# Patient Record
Sex: Female | Born: 1955 | Race: Black or African American | Hispanic: No | Marital: Single | Smoking: Former smoker
Health system: Southern US, Community
[De-identification: ages and names within clinical notes are randomized; demographics above are authoritative.]

## PROBLEM LIST (undated history)

## (undated) DIAGNOSIS — J45909 Unspecified asthma, uncomplicated: Secondary | ICD-10-CM

## (undated) DIAGNOSIS — K219 Gastro-esophageal reflux disease without esophagitis: Secondary | ICD-10-CM

## (undated) DIAGNOSIS — R569 Unspecified convulsions: Secondary | ICD-10-CM

## (undated) DIAGNOSIS — I639 Cerebral infarction, unspecified: Secondary | ICD-10-CM

## (undated) DIAGNOSIS — D62 Acute posthemorrhagic anemia: Secondary | ICD-10-CM

## (undated) DIAGNOSIS — K5900 Constipation, unspecified: Secondary | ICD-10-CM

## (undated) DIAGNOSIS — E119 Type 2 diabetes mellitus without complications: Secondary | ICD-10-CM

## (undated) DIAGNOSIS — K2980 Duodenitis without bleeding: Secondary | ICD-10-CM

## (undated) DIAGNOSIS — G8929 Other chronic pain: Secondary | ICD-10-CM

## (undated) DIAGNOSIS — T148XXA Other injury of unspecified body region, initial encounter: Secondary | ICD-10-CM

## (undated) DIAGNOSIS — I509 Heart failure, unspecified: Secondary | ICD-10-CM

## (undated) DIAGNOSIS — I10 Essential (primary) hypertension: Secondary | ICD-10-CM

## (undated) DIAGNOSIS — J189 Pneumonia, unspecified organism: Secondary | ICD-10-CM

## (undated) DIAGNOSIS — I249 Acute ischemic heart disease, unspecified: Secondary | ICD-10-CM

## (undated) DIAGNOSIS — K56609 Unspecified intestinal obstruction, unspecified as to partial versus complete obstruction: Secondary | ICD-10-CM

## (undated) DIAGNOSIS — M1990 Unspecified osteoarthritis, unspecified site: Secondary | ICD-10-CM

## (undated) DIAGNOSIS — R296 Repeated falls: Secondary | ICD-10-CM

## (undated) HISTORY — PX: BACK SURGERY: SHX140

## (undated) HISTORY — PX: APPENDECTOMY: SHX54

## (undated) HISTORY — PX: CHOLECYSTECTOMY: SHX55

---

## 2014-05-29 ENCOUNTER — Emergency Department (HOSPITAL_BASED_OUTPATIENT_CLINIC_OR_DEPARTMENT_OTHER)
Admission: EM | Admit: 2014-05-29 | Discharge: 2014-05-30 | Disposition: A | Discharge status: Home / Self Care | Payer: Medicaid Other | Attending: Emergency Medicine | Admitting: Emergency Medicine

## 2014-05-29 ENCOUNTER — Emergency Department (HOSPITAL_BASED_OUTPATIENT_CLINIC_OR_DEPARTMENT_OTHER): Payer: Medicaid Other

## 2014-05-29 ENCOUNTER — Encounter (HOSPITAL_BASED_OUTPATIENT_CLINIC_OR_DEPARTMENT_OTHER): Payer: Self-pay | Admitting: Emergency Medicine

## 2014-05-29 DIAGNOSIS — J45909 Unspecified asthma, uncomplicated: Secondary | ICD-10-CM | POA: Insufficient documentation

## 2014-05-29 DIAGNOSIS — R197 Diarrhea, unspecified: Secondary | ICD-10-CM | POA: Insufficient documentation

## 2014-05-29 DIAGNOSIS — Z79899 Other long term (current) drug therapy: Secondary | ICD-10-CM | POA: Insufficient documentation

## 2014-05-29 DIAGNOSIS — M545 Low back pain, unspecified: Secondary | ICD-10-CM | POA: Insufficient documentation

## 2014-05-29 DIAGNOSIS — R112 Nausea with vomiting, unspecified: Secondary | ICD-10-CM | POA: Diagnosis not present

## 2014-05-29 DIAGNOSIS — I10 Essential (primary) hypertension: Secondary | ICD-10-CM | POA: Insufficient documentation

## 2014-05-29 DIAGNOSIS — G8929 Other chronic pain: Secondary | ICD-10-CM | POA: Insufficient documentation

## 2014-05-29 DIAGNOSIS — E119 Type 2 diabetes mellitus without complications: Secondary | ICD-10-CM | POA: Insufficient documentation

## 2014-05-29 DIAGNOSIS — Z8719 Personal history of other diseases of the digestive system: Secondary | ICD-10-CM | POA: Insufficient documentation

## 2014-05-29 DIAGNOSIS — Z8739 Personal history of other diseases of the musculoskeletal system and connective tissue: Secondary | ICD-10-CM | POA: Insufficient documentation

## 2014-05-29 DIAGNOSIS — Z87891 Personal history of nicotine dependence: Secondary | ICD-10-CM | POA: Diagnosis not present

## 2014-05-29 DIAGNOSIS — R1084 Generalized abdominal pain: Secondary | ICD-10-CM

## 2014-05-29 HISTORY — DX: Other chronic pain: G89.29

## 2014-05-29 HISTORY — DX: Constipation, unspecified: K59.00

## 2014-05-29 HISTORY — DX: Type 2 diabetes mellitus without complications: E11.9

## 2014-05-29 HISTORY — DX: Gastro-esophageal reflux disease without esophagitis: K21.9

## 2014-05-29 HISTORY — DX: Unspecified convulsions: R56.9

## 2014-05-29 HISTORY — DX: Essential (primary) hypertension: I10

## 2014-05-29 HISTORY — DX: Unspecified osteoarthritis, unspecified site: M19.90

## 2014-05-29 HISTORY — DX: Unspecified asthma, uncomplicated: J45.909

## 2014-05-29 LAB — CBC WITH DIFFERENTIAL/PLATELET
BASOS PCT: 0 % (ref 0–1)
Basophils Absolute: 0 10*3/uL (ref 0.0–0.1)
EOS ABS: 0 10*3/uL (ref 0.0–0.7)
EOS PCT: 0 % (ref 0–5)
HCT: 30.7 % — ABNORMAL LOW (ref 36.0–46.0)
HEMOGLOBIN: 10.6 g/dL — AB (ref 12.0–15.0)
Lymphocytes Relative: 17 % (ref 12–46)
Lymphs Abs: 1.1 10*3/uL (ref 0.7–4.0)
MCH: 26.2 pg (ref 26.0–34.0)
MCHC: 34.5 g/dL (ref 30.0–36.0)
MCV: 75.8 fL — AB (ref 78.0–100.0)
MONOS PCT: 1 % — AB (ref 3–12)
Monocytes Absolute: 0.1 10*3/uL (ref 0.1–1.0)
NEUTROS PCT: 82 % — AB (ref 43–77)
Neutro Abs: 5.5 10*3/uL (ref 1.7–7.7)
Platelets: 317 10*3/uL (ref 150–400)
RBC: 4.05 MIL/uL (ref 3.87–5.11)
RDW: 14.3 % (ref 11.5–15.5)
WBC: 6.7 10*3/uL (ref 4.0–10.5)

## 2014-05-29 LAB — COMPREHENSIVE METABOLIC PANEL
ALBUMIN: 3.1 g/dL — AB (ref 3.5–5.2)
ALK PHOS: 194 U/L — AB (ref 39–117)
ALT: 23 U/L (ref 0–35)
ANION GAP: 17 — AB (ref 5–15)
AST: 56 U/L — ABNORMAL HIGH (ref 0–37)
BUN: 10 mg/dL (ref 6–23)
CALCIUM: 10 mg/dL (ref 8.4–10.5)
CO2: 19 mEq/L (ref 19–32)
Chloride: 106 mEq/L (ref 96–112)
Creatinine, Ser: 0.9 mg/dL (ref 0.50–1.10)
GFR calc non Af Amer: 66 mL/min — ABNORMAL LOW (ref >90)
GFR, EST AFRICAN AMERICAN: 77 mL/min — AB (ref >90)
Glucose, Bld: 160 mg/dL — ABNORMAL HIGH (ref 70–99)
Potassium: 4.2 mEq/L (ref 3.7–5.3)
Sodium: 142 mEq/L (ref 137–147)
TOTAL PROTEIN: 7.5 g/dL (ref 6.0–8.3)
Total Bilirubin: 0.4 mg/dL (ref 0.3–1.2)

## 2014-05-29 LAB — URINALYSIS, ROUTINE W REFLEX MICROSCOPIC
BILIRUBIN URINE: NEGATIVE
Glucose, UA: NEGATIVE mg/dL
Hgb urine dipstick: NEGATIVE
Ketones, ur: NEGATIVE mg/dL
LEUKOCYTES UA: NEGATIVE
Nitrite: NEGATIVE
PH: 6 (ref 5.0–8.0)
Protein, ur: NEGATIVE mg/dL
SPECIFIC GRAVITY, URINE: 1.007 (ref 1.005–1.030)
UROBILINOGEN UA: 0.2 mg/dL (ref 0.0–1.0)

## 2014-05-29 LAB — LIPASE, BLOOD: LIPASE: 14 U/L (ref 11–59)

## 2014-05-29 MED ORDER — SODIUM CHLORIDE 0.9 % IV SOLN
INTRAVENOUS | Status: DC
  Administered 2014-05-29: via INTRAVENOUS

## 2014-05-29 MED ORDER — ONDANSETRON HCL 4 MG/2ML IJ SOLN
4.0000 mg | Freq: Once | INTRAMUSCULAR | Status: AC
  Administered 2014-05-29: 4 mg via INTRAVENOUS
  Filled 2014-05-29: qty 2

## 2014-05-29 MED ORDER — DIPHENHYDRAMINE HCL 50 MG/ML IJ SOLN
50.0000 mg | Freq: Once | INTRAMUSCULAR | Status: AC
  Administered 2014-05-29: 50 mg via INTRAVENOUS
  Filled 2014-05-29: qty 1

## 2014-05-29 MED ORDER — FENTANYL CITRATE 0.05 MG/ML IJ SOLN
100.0000 ug | Freq: Once | INTRAMUSCULAR | Status: AC
  Administered 2014-05-29: 100 ug via INTRAVENOUS
  Filled 2014-05-29: qty 2

## 2014-05-29 MED ORDER — METHYLPREDNISOLONE SODIUM SUCC 40 MG IJ SOLR
40.0000 mg | Freq: Once | INTRAMUSCULAR | Status: AC
  Administered 2014-05-29: 40 mg via INTRAVENOUS
  Filled 2014-05-29: qty 1

## 2014-05-29 NOTE — ED Notes (Signed)
C/o abd pain, vomiting x 2 days-recent constipation-took laxatives 2 days with good results

## 2014-05-29 NOTE — ED Notes (Signed)
Patient transported to X-ray 

## 2014-05-29 NOTE — ED Provider Notes (Addendum)
CSN: [id number]     Arrival date & time 05/29/14  2225 History  This chart was scribed for Hanley Seamen, MD by Julian Hy, ED Scribe. The patient was seen in [facility]. The patient's care was started at 11:16 PM.      Chief Complaint  Patient presents with  . Abdominal Pain   The history is provided by the patient. No language interpreter was used.   HPI Comments: Amber [name] is a 58 y.o. female with a history of chronic pain on and Opana ER 40mg  BID and oxycodone 15mg  QID. She presents to the Emergency Department complaining of sudden, new, constant abdominal pain onset two days ago. Pt states she had associated distention, vomiting, nausea, diarrhea, decreased appetite, and lower back pain. The pain is "real bad" and worse with movement or palpation. Pt states she has a "tumor" or "cyst" on her abdomen but is unsure what that means. Pt states she has rheumatoid arthritis, notably of the right knee, not relieved with her current medications. Pt states she has a burning sensation in her feet bilaterally.    Past Medical History  Diagnosis Date  . Arthritis   . Asthma   . Hypertension   . Constipation   . GERD (gastroesophageal reflux disease)   . Seizures   . Diabetes mellitus without complication   . Chronic pain    Past Surgical History  Procedure Laterality Date  . Back surgery     No family history on file. History  Substance Use Topics  . Smoking status: Former  . Smokeless tobacco: Not on file  . Alcohol Use: Yes   OB History   Grav Para Term Preterm Abortions TAB SAB Ect Mult Living                 Review of Systems  Gastrointestinal: Positive for nausea, vomiting, abdominal pain and diarrhea.  Musculoskeletal: Positive for back pain (lower).   A complete 10 system review of systems was obtained and all systems are negative except as noted in the HPI and PMH.     Allergies  Ivp dye  Home Medications   Prior to Admission medications    Medication Sig Start Date End Date Taking? Authorizing Provider  ALBUTEROL IN Inhale into the lungs.   Yes Historical Provider, MD  AMLODIPINE BESYLATE PO Take by mouth.   Yes Historical Provider, MD  Atorvastatin Calcium (LIPITOR PO) Take by mouth.   Yes Historical Provider, MD  Fluticasone-Salmeterol (ADVAIR HFA IN) Inhale into the lungs.   Yes Historical Provider, MD  METFORMIN HCL PO Take by mouth.   Yes Historical Provider, MD  Metoprolol Succinate (TOPROL XL PO) Take by mouth.   Yes Historical Provider, MD  oxyCODONE (ROXICODONE) 15 MG immediate release tablet Take 20 mg by mouth every 4 (four) hours as needed for pain.    Yes Historical Provider, MD  Oxycodone HCl 20 MG TABS Take 20 mg by mouth 4 (four) times daily as needed.   Yes Historical Provider, MD  oxymorphone (OPANA ER) 20 MG 12 hr tablet Take 40 mg by mouth every 12 (twelve) hours.    Yes Historical Provider, MD  oxymorphone (OPANA ER) 40 MG 12 hr tablet Take 40 mg by mouth every 12 (twelve) hours.   Yes Historical Provider, MD  POTASSIUM CHLORIDE ER PO Take by mouth.   Yes Historical Provider, MD  TRIAMTERENE PO Take by mouth.   Yes Historical Provider, MD  UNKNOWN TO PATIENT Seizure, cholesterol  meds   Yes Historical Provider, MD   Triage Vitals: BP 121/77  Pulse 104  Temp(Src) 98.3 F (36.8 C) (Oral)  Resp 20  Ht 5' (1.524 m)  Wt 143 lb (64.864 kg)  BMI 27.93 kg/m2  SpO2 98% Physical Exam   General: Well-developed, well-nourished female in no acute distress; appearance consistent with age of record HENT: normocephalic; atraumatic Eyes: pupils equal, round and reactive to light; extraocular muscles intact Neck: supple Heart: regular rate and rhythm; no murmurs, rubs or gallops Lungs: clear to auscultation bilaterally Abdomen: soft; no masses or hepatosplenomegaly; bowel sounds present hyperactive; mild distention; diffuse tenderness  Extremities: No deformity; full range of motion; pulses normal; +1 edema of  lower legs  Neurologic: Awake, alert and oriented; motor function intact in all extremities and symmetric; no facial droop Skin: Warm and dry Psychiatric: Normal mood and affect   ED Course  Procedures (including critical care time) DIAGNOSTIC STUDIES: Oxygen Saturation is 98% on RA, normal by my interpretation.    COORDINATION OF CARE: 11:20 PM- Will order abdomen x-ray and CBC. Patient informed of current plan for treatment and evaluation and agrees with plan at this time.   MDM   Nursing notes and vitals signs, including pulse oximetry, reviewed.  Summary of this visit's results, reviewed by myself:  Labs:  Results for orders placed during the hospital encounter of 05/29/14 (from the past 24 hour(s))  CBC WITH DIFFERENTIAL     Status: Abnormal   Collection Time    05/29/14 10:28 PM      Result Value Ref Range   WBC 6.7  4.0 - 10.5 K/uL   RBC 4.05  3.87 - 5.11 MIL/uL   Hemoglobin 10.6 (*) 12.0 - 15.0 g/dL   HCT 83.1 (*) 51.7 - 61.6 %   MCV 75.8 (*) 78.0 - 100.0 fL   MCH 26.2  26.0 - 34.0 pg   MCHC 34.5  30.0 - 36.0 g/dL   RDW 07.3  71.0 - 62.6 %   Platelets 317  150 - 400 K/uL   Neutrophils Relative % 82 (*) 43 - 77 %   Neutro Abs 5.5  1.7 - 7.7 K/uL   Lymphocytes Relative 17  12 - 46 %   Lymphs Abs 1.1  0.7 - 4.0 K/uL   Monocytes Relative 1 (*) 3 - 12 %   Monocytes Absolute 0.1  0.1 - 1.0 K/uL   Eosinophils Relative 0  0 - 5 %   Eosinophils Absolute 0.0  0.0 - 0.7 K/uL   Basophils Relative 0  0 - 1 %   Basophils Absolute 0.0  0.0 - 0.1 K/uL  COMPREHENSIVE METABOLIC PANEL     Status: Abnormal   Collection Time    05/29/14 10:28 PM      Result Value Ref Range   Sodium 142  137 - 147 mEq/L   Potassium 4.2  3.7 - 5.3 mEq/L   Chloride 106  96 - 112 mEq/L   CO2 19  19 - 32 mEq/L   Glucose, Bld 160 (*) 70 - 99 mg/dL   BUN 10  6 - 23 mg/dL   Creatinine, Ser 9.48  0.50 - 1.10 mg/dL   Calcium 54.6  8.4 - 27.0 mg/dL   Total Protein 7.5  6.0 - 8.3 g/dL   Albumin 3.1  (*) 3.5 - 5.2 g/dL   AST 56 (*) 0 - 37 U/L   ALT 23  0 - 35 U/L   Alkaline Phosphatase 194 (*)  39 - 117 U/L   Total Bilirubin 0.4  0.3 - 1.2 mg/dL   GFR calc non Af Amer 66 (*) >90 mL/min   GFR calc Af Amer 77 (*) >90 mL/min   Anion gap 17 (*) 5 - 15  LIPASE, BLOOD     Status: None   Collection Time    05/29/14 10:28 PM      Result Value Ref Range   Lipase 14  11 - 59 U/L  URINALYSIS, ROUTINE W REFLEX MICROSCOPIC     Status: None   Collection Time    05/29/14 10:49 PM      Result Value Ref Range   Color, Urine YELLOW  YELLOW   APPearance CLEAR  CLEAR   Specific Gravity, Urine 1.007  1.005 - 1.030   pH 6.0  5.0 - 8.0   Glucose, UA NEGATIVE  NEGATIVE mg/dL   Hgb urine dipstick NEGATIVE  NEGATIVE   Bilirubin Urine NEGATIVE  NEGATIVE   Ketones, ur NEGATIVE  NEGATIVE mg/dL   Protein, ur NEGATIVE  NEGATIVE mg/dL   Urobilinogen, UA 0.2  0.0 - 1.0 mg/dL   Nitrite NEGATIVE  NEGATIVE   Leukocytes, UA NEGATIVE  NEGATIVE    Imaging Studies: Dg Abd 1 View  05/29/2014   CLINICAL DATA:  Abdominal pain and vomiting for 2 days. Recent constipation.  EXAM: ABDOMEN - 1 VIEW  COMPARISON:  None.  FINDINGS: Scattered gas and stool throughout the colon. No small or large bowel distention. No radiopaque stones. Surgical clips in the right upper quadrant. Stent consistent with biliary stent in the upper mid abdomen. Degenerative changes in the spine and hips.  IMPRESSION: Nonobstructive bowel gas pattern with stool-filled colon.   Electronically Signed   By: Burman Nieves M.D.   On: 05/29/2014 23:27   2:48 AM Patient advised of unremarkable lab and CT findings. Patient is requesting additional narcotic pain medication. She was advised that additional pain medication is not indicated if she is already on large doses of chronic narcotics. When told she could not have pain medication she requested something to eat.  I personally performed the services described in this documentation, which was scribed  in my presence. The recorded information has been reviewed and is accurate.   Hanley Seamen, MD 05/30/14 5916  Hanley Seamen, MD 05/30/14 [phone]

## 2014-05-30 ENCOUNTER — Encounter (HOSPITAL_BASED_OUTPATIENT_CLINIC_OR_DEPARTMENT_OTHER): Payer: Self-pay | Admitting: Emergency Medicine

## 2014-05-30 MED ORDER — FENTANYL CITRATE 0.05 MG/ML IJ SOLN
100.0000 ug | Freq: Once | INTRAMUSCULAR | Status: AC
  Administered 2014-05-30: 100 ug via INTRAVENOUS
  Filled 2014-05-30: qty 2

## 2014-05-30 MED ORDER — IOHEXOL 300 MG/ML  SOLN
100.0000 mL | Freq: Once | INTRAMUSCULAR | Status: AC | PRN
  Administered 2014-05-30: 100 mL via INTRAVENOUS

## 2014-05-30 MED ORDER — IOHEXOL 300 MG/ML  SOLN
50.0000 mL | Freq: Once | INTRAMUSCULAR | Status: AC | PRN
  Administered 2014-05-29: 50 mL via ORAL

## 2014-05-30 NOTE — ED Notes (Signed)
Patient transported to CT 

## 2014-05-30 NOTE — Discharge Instructions (Signed)

## 2014-05-30 NOTE — ED Notes (Signed)
Pt finished contrast ct aware 

## 2014-05-30 NOTE — ED Notes (Signed)
Assisted out to lobby to await cab. Pt alert and oriented in nad, family at their side

## 2014-06-07 ENCOUNTER — Encounter (HOSPITAL_BASED_OUTPATIENT_CLINIC_OR_DEPARTMENT_OTHER): Payer: Self-pay | Admitting: Emergency Medicine

## 2014-06-07 ENCOUNTER — Emergency Department (HOSPITAL_BASED_OUTPATIENT_CLINIC_OR_DEPARTMENT_OTHER)
Admission: EM | Admit: 2014-06-07 | Discharge: 2014-06-07 | Disposition: A | Discharge status: Home / Self Care | Payer: Medicaid Other | Attending: Emergency Medicine | Admitting: Emergency Medicine

## 2014-06-07 DIAGNOSIS — N390 Urinary tract infection, site not specified: Secondary | ICD-10-CM | POA: Diagnosis not present

## 2014-06-07 DIAGNOSIS — Z79899 Other long term (current) drug therapy: Secondary | ICD-10-CM | POA: Insufficient documentation

## 2014-06-07 DIAGNOSIS — E119 Type 2 diabetes mellitus without complications: Secondary | ICD-10-CM | POA: Diagnosis not present

## 2014-06-07 DIAGNOSIS — Z8719 Personal history of other diseases of the digestive system: Secondary | ICD-10-CM | POA: Diagnosis not present

## 2014-06-07 DIAGNOSIS — I10 Essential (primary) hypertension: Secondary | ICD-10-CM | POA: Insufficient documentation

## 2014-06-07 DIAGNOSIS — R609 Edema, unspecified: Secondary | ICD-10-CM | POA: Diagnosis not present

## 2014-06-07 DIAGNOSIS — R300 Dysuria: Secondary | ICD-10-CM | POA: Diagnosis present

## 2014-06-07 DIAGNOSIS — G8929 Other chronic pain: Secondary | ICD-10-CM | POA: Diagnosis not present

## 2014-06-07 DIAGNOSIS — J45909 Unspecified asthma, uncomplicated: Secondary | ICD-10-CM | POA: Diagnosis not present

## 2014-06-07 DIAGNOSIS — Z8739 Personal history of other diseases of the musculoskeletal system and connective tissue: Secondary | ICD-10-CM | POA: Diagnosis not present

## 2014-06-07 DIAGNOSIS — E876 Hypokalemia: Secondary | ICD-10-CM | POA: Insufficient documentation

## 2014-06-07 DIAGNOSIS — Z87891 Personal history of nicotine dependence: Secondary | ICD-10-CM | POA: Diagnosis not present

## 2014-06-07 DIAGNOSIS — IMO0002 Reserved for concepts with insufficient information to code with codable children: Secondary | ICD-10-CM | POA: Insufficient documentation

## 2014-06-07 LAB — BASIC METABOLIC PANEL
Anion gap: 17 — ABNORMAL HIGH (ref 5–15)
BUN: 16 mg/dL (ref 6–23)
CO2: 24 mEq/L (ref 19–32)
Calcium: 8.8 mg/dL (ref 8.4–10.5)
Chloride: 97 mEq/L (ref 96–112)
Creatinine, Ser: 0.8 mg/dL (ref 0.50–1.10)
GFR calc Af Amer: >90 mL/min (ref >90)
GFR, EST NON AFRICAN AMERICAN: 81 mL/min — AB (ref >90)
Glucose, Bld: 121 mg/dL — ABNORMAL HIGH (ref 70–99)
POTASSIUM: 3.1 meq/L — AB (ref 3.7–5.3)
SODIUM: 138 meq/L (ref 137–147)

## 2014-06-07 LAB — URINALYSIS, ROUTINE W REFLEX MICROSCOPIC
Bilirubin Urine: NEGATIVE
Glucose, UA: NEGATIVE mg/dL
Hgb urine dipstick: NEGATIVE
Ketones, ur: NEGATIVE mg/dL
Nitrite: NEGATIVE
Protein, ur: NEGATIVE mg/dL
Specific Gravity, Urine: 1.009 (ref 1.005–1.030)
UROBILINOGEN UA: 1 mg/dL (ref 0.0–1.0)
pH: 6 (ref 5.0–8.0)

## 2014-06-07 LAB — URINE MICROSCOPIC-ADD ON

## 2014-06-07 MED ORDER — PHENAZOPYRIDINE HCL 200 MG PO TABS: 200.0000 mg | ORAL_TABLET | Freq: Three times a day (TID) | ORAL | Status: DC | PRN

## 2014-06-07 MED ORDER — PHENAZOPYRIDINE HCL 100 MG PO TABS
200.0000 mg | ORAL_TABLET | Freq: Three times a day (TID) | ORAL | Status: DC
  Administered 2014-06-07: 200 mg via ORAL
  Filled 2014-06-07: qty 2

## 2014-06-07 MED ORDER — CEFTRIAXONE SODIUM 1 G IJ SOLR
1.0000 g | Freq: Once | INTRAMUSCULAR | Status: AC
  Administered 2014-06-07: 1 g via INTRAMUSCULAR
  Filled 2014-06-07: qty 10

## 2014-06-07 MED ORDER — POTASSIUM CHLORIDE CRYS ER 20 MEQ PO TBCR: 40.0000 meq | EXTENDED_RELEASE_TABLET | Freq: Every day | ORAL | Status: DC

## 2014-06-07 MED ORDER — CEPHALEXIN 500 MG PO CAPS: 500.0000 mg | ORAL_CAPSULE | Freq: Two times a day (BID) | ORAL | Status: DC

## 2014-06-07 MED ORDER — POTASSIUM CHLORIDE CRYS ER 20 MEQ PO TBCR
40.0000 meq | EXTENDED_RELEASE_TABLET | Freq: Once | ORAL | Status: AC
  Administered 2014-06-07: 40 meq via ORAL
  Filled 2014-06-07: qty 2

## 2014-06-07 MED ORDER — LIDOCAINE HCL (PF) 1 % IJ SOLN
INTRAMUSCULAR | Status: AC
  Filled 2014-06-07: qty 5

## 2014-06-07 NOTE — Discharge Instructions (Signed)
°  Take your antibiotics as directed and to completion. You should never have any leftover antibiotics! Push fluids and stay well hydrated.   Please follow with your primary care doctor in the next 2 days for a check-up. They must obtain records for further management.   Do not hesitate to return to the Emergency Department for any new, worsening or concerning symptoms.    Edema Edema is an abnormal buildup of fluids. It is more common in your legs and thighs. Painless swelling of the feet and ankles is more likely as a person ages. It also is common in looser skin, like around your eyes. HOME CARE   Keep the affected body part above the level of the heart while lying down.  Do not sit still or stand for a long time.  Do not put anything right under your knees when you lie down.  Do not wear tight clothes on your upper legs.  Exercise your legs to help the puffiness (swelling) go down.  Wear elastic bandages or support stockings as told by your doctor.  A low-salt diet may help lessen the puffiness.  Only take medicine as told by your doctor. GET HELP IF:  Treatment is not working.  You have heart, liver, or kidney disease and notice that your skin looks puffy or shiny.  You have puffiness in your legs that does not get better when you raise your legs.  You have sudden weight gain for no reason. GET HELP RIGHT AWAY IF:   You have shortness of breath or chest pain.  You cannot breathe when you lie down.  You have pain, redness, or warmth in the areas that are puffy.  You have heart, liver, or kidney disease and get edema all of a sudden.  You have a fever and your symptoms get worse all of a sudden. MAKE SURE YOU:   Understand these instructions.  Will watch your condition.  Will get help right away if you are not doing well or get worse. Document Released: 04/24/2008 Document Revised: 11/11/2013 Document Reviewed: 08/29/2013 [facility] Patient Information 2015  [place], [place]. This information is not intended to replace advice given to you by your health care provider. Make sure you discuss any questions you have with your health care provider.

## 2014-06-07 NOTE — ED Provider Notes (Signed)
CSN: [id number]     Arrival date & time 06/07/14  1953 History   First MD Initiated Contact with Patient 06/07/14 2014     Chief Complaint  Patient presents with  . Urinary Tract Infection     (Consider location/radiation/quality/duration/timing/severity/associated sxs/prior Treatment) HPI  Amber [name] is a 58 y.o. female with past medical history significant for non-insulin-dependent diabetes, hypertension complaining of dysuria, urinary frequency, dark foul-smelling urine worsening over the course of 4 days. Patient denies flank pain, fever, chills. She states that she vomited twice yesterday but is tolerating by mouth today. Patient also reports increasing bilateral peripheral edema. States that it is worse when she walks and is on her feet for longer period of time. She denies chest pain, shortness of breath, cough, paroxysmal nocturnal dyspnea, orthopnea. States she takes Lasix 40 mg twice a day regularly and elevate the legs which helps with the edema. Does not use compression stockings.  Past Medical History  Diagnosis Date  . Arthritis   . Asthma   . Hypertension   . Constipation   . GERD (gastroesophageal reflux disease)   . Seizures   . Diabetes mellitus without complication   . Chronic pain    Past Surgical History  Procedure Laterality Date  . Back surgery     No family history on file. History  Substance Use Topics  . Smoking status: Former Games developer  . Smokeless tobacco: Not on file  . Alcohol Use: Yes   OB History   Grav Para Term Preterm Abortions TAB SAB Ect Mult Living                 Review of Systems  10 systems reviewed and found to be negative, except as noted in the HPI.   Allergies  Ivp dye  Home Medications   Prior to Admission medications   Medication Sig Start Date End Date Taking? Authorizing Provider  ALBUTEROL IN Inhale into the lungs.    Historical Provider, MD  AMLODIPINE BESYLATE PO Take by mouth.    Historical Provider, MD   Atorvastatin Calcium (LIPITOR PO) Take by mouth.    Historical Provider, MD  cephALEXin (KEFLEX) 500 MG capsule Take 1 capsule (500 mg total) by mouth 2 (two) times daily. 06/07/14   Esco Joslyn, PA-C  Fluticasone-Salmeterol (ADVAIR HFA IN) Inhale into the lungs.    Historical Provider, MD  METFORMIN HCL PO Take by mouth.    Historical Provider, MD  Metoprolol Succinate (TOPROL XL PO) Take by mouth.    Historical Provider, MD  oxyCODONE (ROXICODONE) 15 MG immediate release tablet Take 20 mg by mouth every 4 (four) hours as needed for pain.     Historical Provider, MD  Oxycodone HCl 20 MG TABS Take 20 mg by mouth 4 (four) times daily as needed.    Historical Provider, MD  oxymorphone (OPANA ER) 20 MG 12 hr tablet Take 40 mg by mouth every 12 (twelve) hours.     Historical Provider, MD  oxymorphone (OPANA ER) 40 MG 12 hr tablet Take 40 mg by mouth every 12 (twelve) hours.    Historical Provider, MD  phenazopyridine (PYRIDIUM) 200 MG tablet Take 1 tablet (200 mg total) by mouth 3 (three) times daily as needed for pain. 06/07/14   [name], PA-C  POTASSIUM CHLORIDE ER PO Take by mouth.    Historical Provider, MD  potassium chloride SA (K-DUR,KLOR-CON) 20 MEQ tablet Take 2 tablets (40 mEq total) by mouth daily. 06/07/14   Wynetta Emery, PA-C  TRIAMTERENE PO Take by mouth.    Historical Provider, MD  UNKNOWN TO PATIENT Seizure, cholesterol meds    Historical Provider, MD   BP 124/81  Pulse 92  Temp(Src) 98.2 F (36.8 C) (Oral)  Resp 18  Ht 5' (1.524 m)  Wt 136 lb 6.4 oz (61.871 kg)  BMI 26.64 kg/m2  SpO2 98% Physical Exam  Nursing note and vitals reviewed. Constitutional: She is oriented to person, place, and time. She appears well-developed and well-nourished. No distress.  HENT:  Head: Normocephalic and atraumatic.  Mouth/Throat: Oropharynx is clear and moist.  Eyes: Conjunctivae and EOM are normal. Pupils are equal, round, and reactive to light.  Neck: Normal range of  motion.  Cardiovascular: Normal rate, regular rhythm and intact distal pulses.   Pulmonary/Chest: Effort normal and breath sounds normal. No stridor. No respiratory distress. She has no wheezes. She has no rales. She exhibits no tenderness.  Abdominal: Soft. Bowel sounds are normal. She exhibits no distension and no mass. There is no tenderness. There is no rebound and no guarding.  Genitourinary:  CVA tenderness palpation bilaterally.  Musculoskeletal: Normal range of motion. She exhibits edema.  4+ pitting edema to proximal shin. Equal bilaterally. No overlying skin changes or warmth.  Neurological: She is alert and oriented to person, place, and time.  Psychiatric: She has a normal mood and affect.    ED Course  Procedures (including critical care time) Labs Review Labs Reviewed  URINALYSIS, ROUTINE W REFLEX MICROSCOPIC - Abnormal; Notable for the following:    APPearance CLOUDY (*)    Leukocytes, UA SMALL (*)    All other components within normal limits  URINE MICROSCOPIC-ADD ON - Abnormal; Notable for the following:    Squamous Epithelial / LPF FEW (*)    Bacteria, UA MANY (*)    All other components within normal limits  BASIC METABOLIC PANEL - Abnormal; Notable for the following:    Potassium 3.1 (*)    Glucose, Bld 121 (*)    GFR calc non Af Amer 81 (*)    Anion gap 17 (*)    All other components within normal limits  URINE CULTURE  I-STAT CHEM 8, ED    Imaging Review No results found.   EKG Interpretation None      MDM   Final diagnoses:  UTI (lower urinary tract infection)  Peripheral edema  Hypokalemia    Filed Vitals:   06/07/14 2003  BP: 124/81  Pulse: 92  Temp: 98.2 F (36.8 C)  TempSrc: Oral  Resp: 18  Height: 5' (1.524 m)  Weight: 136 lb 6.4 oz (61.871 kg)  SpO2: 98%    Medications  phenazopyridine (PYRIDIUM) tablet 200 mg (200 mg Oral Given 06/07/14 2121)  lidocaine (PF) (XYLOCAINE) 1 % injection (not administered)  potassium chloride  SA (K-DUR,KLOR-CON) CR tablet 40 mEq (not administered)  cefTRIAXone (ROCEPHIN) injection 1 g (1 g Intramuscular Given 06/07/14 2121)    Amber [name] is a 58 y.o. female presenting with dysuria, no systemic signs of infection, no CVA tenderness to palpation. Patient also states that her peripheral edema is worsening she has been compliant with her Lasix and potassium supplementation. Urinalysis consistent with infection, patient will be given a gram of Rocephin and urine culture is ordered. Chemistry shows normal renal function with low potassium at 3.1. Will supplement and I've advised the patient to try compression stockings. She will follow with her primary care early this week.  Discussed case with attending MD who agrees with  plan and stability to d/c to home.   Evaluation does not show pathology that would require ongoing emergent intervention or inpatient treatment. Pt is hemodynamically stable and mentating appropriately. Discussed findings and plan with patient/guardian, who agrees with care plan. All questions answered. Return precautions discussed and outpatient follow up given.   New Prescriptions   CEPHALEXIN (KEFLEX) 500 MG CAPSULE    Take 1 capsule (500 mg total) by mouth 2 (two) times daily.   PHENAZOPYRIDINE (PYRIDIUM) 200 MG TABLET    Take 1 tablet (200 mg total) by mouth 3 (three) times daily as needed for pain.   POTASSIUM CHLORIDE SA (K-DUR,KLOR-CON) 20 MEQ TABLET    Take 2 tablets (40 mEq total) by mouth daily.         Wynetta Emery, PA-C 06/07/14 2155

## 2014-06-07 NOTE — ED Notes (Signed)
Pt is here due to pain and burning with urination x 4 days.  Pt also reports that her urine is "stong and smells bad".  Pt is tearful in triage, states that she thinks she has a UTI.  Pt also has bilateral lower extremity edema and tells me this has been present for a week.

## 2014-06-08 NOTE — ED Provider Notes (Signed)
Medical screening examination/treatment/procedure(s) were performed by non-physician practitioner and as supervising physician I was immediately available for consultation/collaboration.   EKG Interpretation None       Hurman Horn, MD 06/08/14 2115

## 2014-06-10 LAB — URINE CULTURE: Colony Count: >=100000

## 2014-06-11 ENCOUNTER — Telehealth (HOSPITAL_BASED_OUTPATIENT_CLINIC_OR_DEPARTMENT_OTHER): Payer: Self-pay | Admitting: Emergency Medicine

## 2014-06-11 NOTE — Telephone Encounter (Signed)
Post ED Visit - Positive Culture Follow-up  Culture report reviewed by antimicrobial stewardship pharmacist: []  Wes Dulaney, Pharm.D., BCPS []  , .D., BCPS []  Celedonio Miyamoto, [address].D., BCPS [x]  [place], Georgina Pillion.D., BCPS, AAHIVP []  [address], Pharm.D., BCPS, AAHIVP  Positive urine culture Treated with Keflex, organism sensitive to the same and no further patient follow-up is required at this time.  06/11/2014, 6:48 PM

## 2014-07-15 IMAGING — CR DG ABDOMEN 1V
2 series · 2 of 2 positions shown · non-contrast
Comparison: None.

CLINICAL DATA: Abdominal pain and vomiting for 2 days. Recent
constipation.

EXAM:
ABDOMEN - 1 VIEW

[t abdomen supine (1 of 2)]
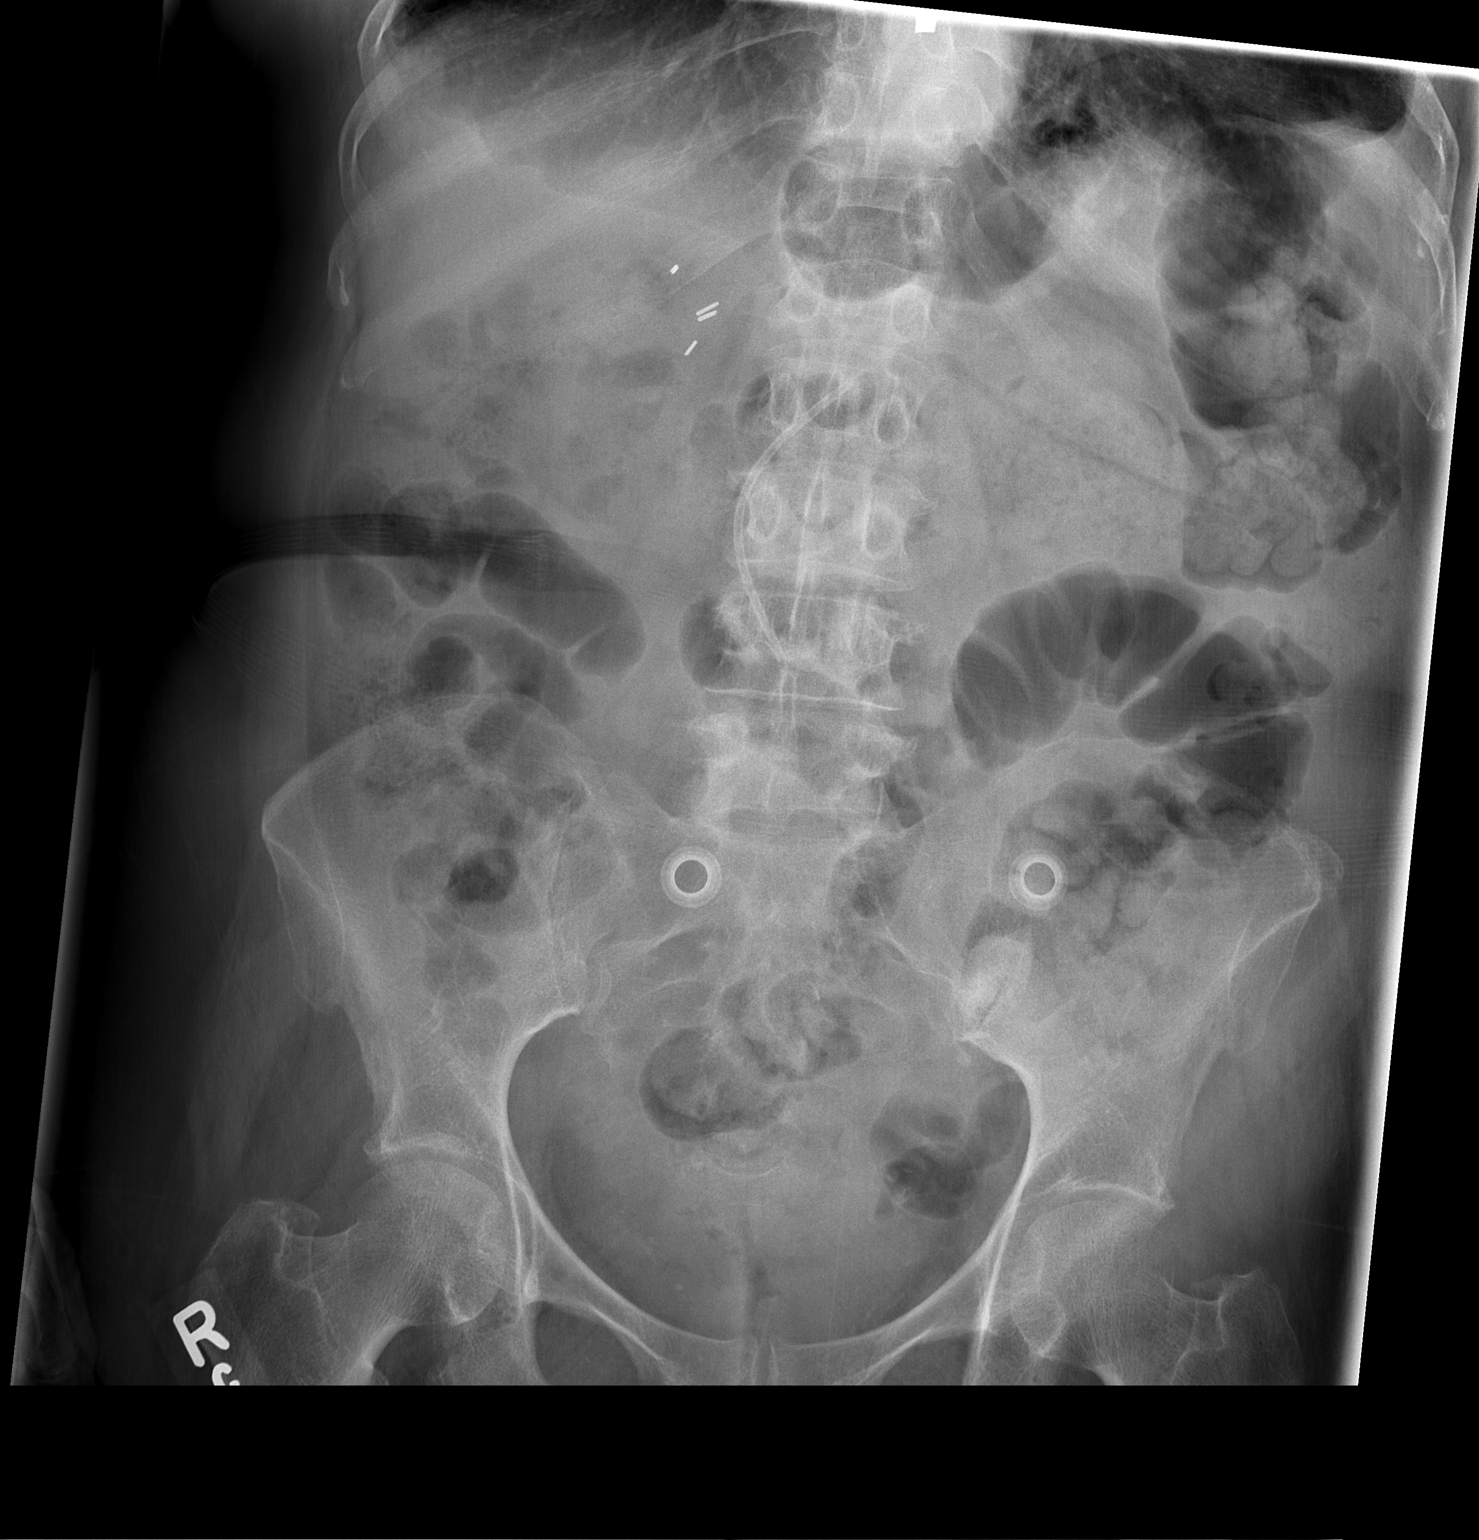

[t abdomen supine (2 of 2)]
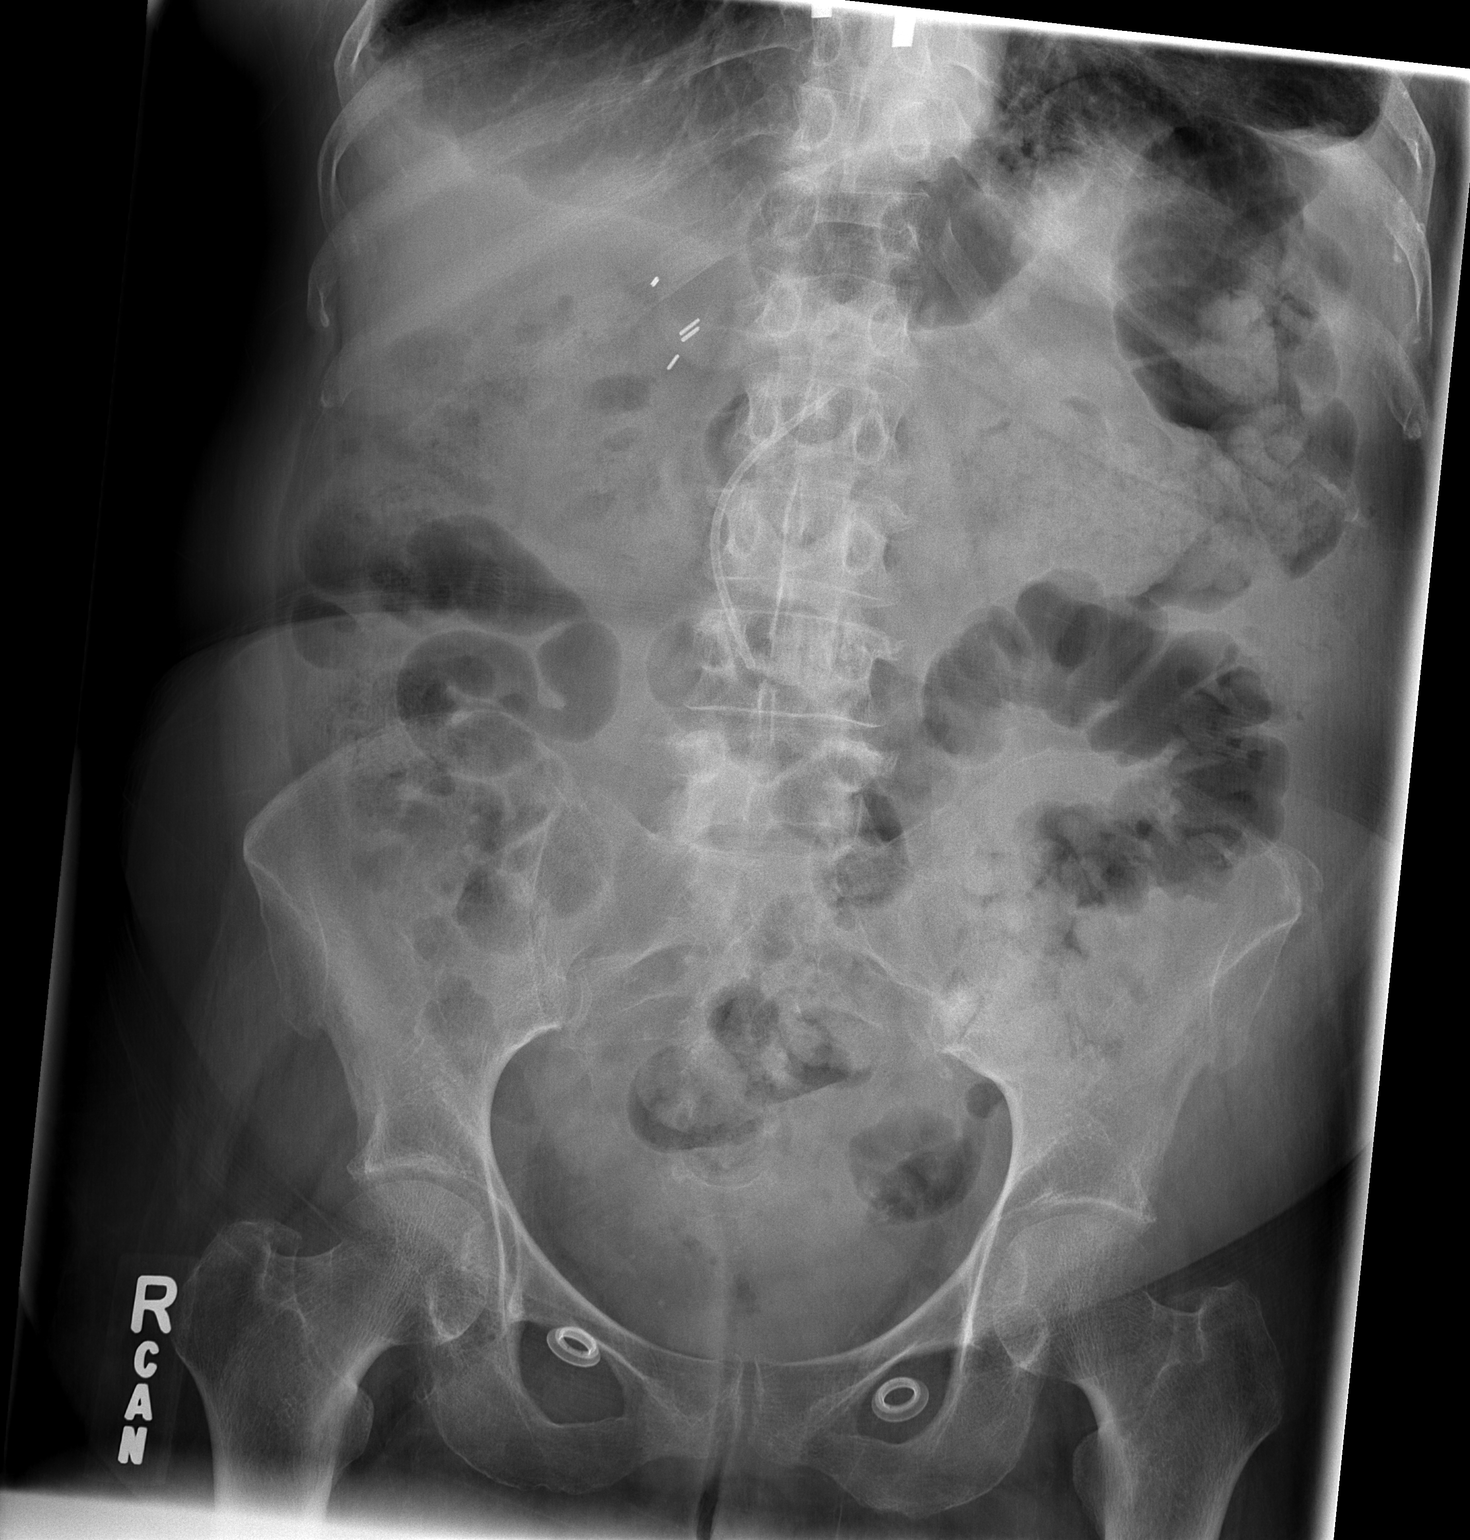

[2 of 2 positions shown; findings below may reference images not displayed]

FINDINGS: Scattered gas and stool throughout the colon. No small or large
bowel distention. No radiopaque stones. Surgical clips in the right
upper quadrant. Stent consistent with biliary stent in the upper mid
abdomen. Degenerative changes in the spine and hips.
IMPRESSION: Nonobstructive bowel gas pattern with stool-filled colon.

## 2015-01-25 ENCOUNTER — Inpatient Hospital Stay (HOSPITAL_BASED_OUTPATIENT_CLINIC_OR_DEPARTMENT_OTHER)
Admission: EM | Admit: 2015-01-25 | Discharge: 2015-01-27 | DRG: 312 | Disposition: A | Discharge status: Home Health | Payer: Medicaid Other | Attending: Internal Medicine | Admitting: Internal Medicine

## 2015-01-25 ENCOUNTER — Encounter (HOSPITAL_BASED_OUTPATIENT_CLINIC_OR_DEPARTMENT_OTHER): Payer: Self-pay | Admitting: *Deleted

## 2015-01-25 ENCOUNTER — Emergency Department (HOSPITAL_BASED_OUTPATIENT_CLINIC_OR_DEPARTMENT_OTHER): Payer: Medicaid Other

## 2015-01-25 DIAGNOSIS — J45909 Unspecified asthma, uncomplicated: Secondary | ICD-10-CM | POA: Diagnosis present

## 2015-01-25 DIAGNOSIS — T380X6A Underdosing of glucocorticoids and synthetic analogues, initial encounter: Secondary | ICD-10-CM | POA: Diagnosis present

## 2015-01-25 DIAGNOSIS — R569 Unspecified convulsions: Secondary | ICD-10-CM | POA: Diagnosis present

## 2015-01-25 DIAGNOSIS — Z87891 Personal history of nicotine dependence: Secondary | ICD-10-CM | POA: Diagnosis not present

## 2015-01-25 DIAGNOSIS — Z91138 Patient's unintentional underdosing of medication regimen for other reason: Secondary | ICD-10-CM | POA: Diagnosis present

## 2015-01-25 DIAGNOSIS — I951 Orthostatic hypotension: Secondary | ICD-10-CM | POA: Diagnosis present

## 2015-01-25 DIAGNOSIS — R55 Syncope and collapse: Secondary | ICD-10-CM | POA: Diagnosis present

## 2015-01-25 DIAGNOSIS — Z87898 Personal history of other specified conditions: Secondary | ICD-10-CM

## 2015-01-25 DIAGNOSIS — G8929 Other chronic pain: Secondary | ICD-10-CM | POA: Diagnosis present

## 2015-01-25 DIAGNOSIS — M1990 Unspecified osteoarthritis, unspecified site: Secondary | ICD-10-CM | POA: Diagnosis present

## 2015-01-25 DIAGNOSIS — R000 Tachycardia, unspecified: Secondary | ICD-10-CM | POA: Diagnosis present

## 2015-01-25 DIAGNOSIS — E876 Hypokalemia: Secondary | ICD-10-CM | POA: Diagnosis present

## 2015-01-25 DIAGNOSIS — M069 Rheumatoid arthritis, unspecified: Secondary | ICD-10-CM | POA: Diagnosis present

## 2015-01-25 DIAGNOSIS — I10 Essential (primary) hypertension: Secondary | ICD-10-CM | POA: Diagnosis present

## 2015-01-25 DIAGNOSIS — W19XXXA Unspecified fall, initial encounter: Secondary | ICD-10-CM

## 2015-01-25 DIAGNOSIS — I959 Hypotension, unspecified: Secondary | ICD-10-CM | POA: Diagnosis present

## 2015-01-25 DIAGNOSIS — Z7952 Long term (current) use of systemic steroids: Secondary | ICD-10-CM | POA: Diagnosis not present

## 2015-01-25 DIAGNOSIS — K219 Gastro-esophageal reflux disease without esophagitis: Secondary | ICD-10-CM | POA: Diagnosis present

## 2015-01-25 DIAGNOSIS — D509 Iron deficiency anemia, unspecified: Secondary | ICD-10-CM | POA: Diagnosis present

## 2015-01-25 DIAGNOSIS — E1165 Type 2 diabetes mellitus with hyperglycemia: Secondary | ICD-10-CM | POA: Diagnosis present

## 2015-01-25 LAB — URINALYSIS, ROUTINE W REFLEX MICROSCOPIC
BILIRUBIN URINE: NEGATIVE
GLUCOSE, UA: NEGATIVE mg/dL
HGB URINE DIPSTICK: NEGATIVE
KETONES UR: NEGATIVE mg/dL
Nitrite: NEGATIVE
PROTEIN: NEGATIVE mg/dL
Specific Gravity, Urine: 1.013 (ref 1.005–1.030)
Urobilinogen, UA: 0.2 mg/dL (ref 0.0–1.0)
pH: 7 (ref 5.0–8.0)

## 2015-01-25 LAB — I-STAT CG4 LACTIC ACID, ED
LACTIC ACID, VENOUS: 2.54 mmol/L — AB (ref 0.5–2.0)
Lactic Acid, Venous: 1.94 mmol/L (ref 0.5–2.0)

## 2015-01-25 LAB — CBC WITH DIFFERENTIAL/PLATELET
BASOS ABS: 0 10*3/uL (ref 0.0–0.1)
BASOS PCT: 1 % (ref 0–1)
EOS ABS: 0.1 10*3/uL (ref 0.0–0.7)
Eosinophils Relative: 2 % (ref 0–5)
HEMATOCRIT: 30.8 % — AB (ref 36.0–46.0)
Hemoglobin: 10.4 g/dL — ABNORMAL LOW (ref 12.0–15.0)
LYMPHS ABS: 2.1 10*3/uL (ref 0.7–4.0)
LYMPHS PCT: 33 % (ref 12–46)
MCH: 25.6 pg — ABNORMAL LOW (ref 26.0–34.0)
MCHC: 33.8 g/dL (ref 30.0–36.0)
MCV: 75.7 fL — AB (ref 78.0–100.0)
MONO ABS: 0.7 10*3/uL (ref 0.1–1.0)
Monocytes Relative: 11 % (ref 3–12)
Neutro Abs: 3.4 10*3/uL (ref 1.7–7.7)
Neutrophils Relative %: 53 % (ref 43–77)
Platelets: 251 10*3/uL (ref 150–400)
RBC: 4.07 MIL/uL (ref 3.87–5.11)
RDW: 13.8 % (ref 11.5–15.5)
WBC: 6.3 10*3/uL (ref 4.0–10.5)

## 2015-01-25 LAB — TROPONIN I: Troponin I: 0.03 ng/mL (ref <0.031)

## 2015-01-25 LAB — BASIC METABOLIC PANEL
ANION GAP: 5 (ref 5–15)
BUN: 14 mg/dL (ref 6–23)
CALCIUM: 7.3 mg/dL — AB (ref 8.4–10.5)
CO2: 23 mmol/L (ref 19–32)
Chloride: 106 mmol/L (ref 96–112)
Creatinine, Ser: 0.93 mg/dL (ref 0.50–1.10)
GFR, EST AFRICAN AMERICAN: 78 mL/min — AB (ref >90)
GFR, EST NON AFRICAN AMERICAN: 67 mL/min — AB (ref >90)
Glucose, Bld: 160 mg/dL — ABNORMAL HIGH (ref 70–99)
Potassium: 2.9 mmol/L — ABNORMAL LOW (ref 3.5–5.1)
SODIUM: 134 mmol/L — AB (ref 135–145)

## 2015-01-25 LAB — URINE MICROSCOPIC-ADD ON

## 2015-01-25 MED ORDER — HYDROMORPHONE HCL 1 MG/ML IJ SOLN
1.0000 mg | Freq: Once | INTRAMUSCULAR | Status: AC
  Administered 2015-01-26: 1 mg via INTRAVENOUS
  Filled 2015-01-25: qty 1

## 2015-01-25 MED ORDER — SODIUM CHLORIDE 0.9 % IV BOLUS (SEPSIS)
1000.0000 mL | Freq: Once | INTRAVENOUS | Status: AC
  Administered 2015-01-25: 1000 mL via INTRAVENOUS

## 2015-01-25 MED ORDER — POTASSIUM CHLORIDE CRYS ER 20 MEQ PO TBCR
EXTENDED_RELEASE_TABLET | ORAL | Status: AC
  Filled 2015-01-25: qty 1

## 2015-01-25 MED ORDER — POTASSIUM CHLORIDE 10 MEQ/100ML IV SOLN
10.0000 meq | Freq: Once | INTRAVENOUS | Status: AC
  Administered 2015-01-25: 10 meq via INTRAVENOUS
  Filled 2015-01-25: qty 100

## 2015-01-25 MED ORDER — POTASSIUM CHLORIDE CRYS ER 20 MEQ PO TBCR
40.0000 meq | EXTENDED_RELEASE_TABLET | Freq: Once | ORAL | Status: AC
  Administered 2015-01-25: 40 meq via ORAL
  Filled 2015-01-25: qty 2

## 2015-01-25 MED ORDER — SODIUM CHLORIDE 0.9 % IV SOLN
Freq: Once | INTRAVENOUS | Status: AC
  Administered 2015-01-25: 22:00:00 via INTRAVENOUS

## 2015-01-25 MED ORDER — FENTANYL CITRATE 0.05 MG/ML IJ SOLN
50.0000 ug | Freq: Once | INTRAMUSCULAR | Status: AC
  Administered 2015-01-25: 50 ug via INTRAVENOUS
  Filled 2015-01-25: qty 2

## 2015-01-25 NOTE — ED Notes (Signed)
Alert, NAD, calm, interactive, "feel about the same".

## 2015-01-25 NOTE — ED Provider Notes (Signed)
CSN: [id number]     Arrival date & time 01/25/15  1901 History  This chart was scribed for No att. providers found by Tonye Royalty, ED Scribe. This patient was seen in room 5W02C/5W02C-01 and the patient's care was started at 8:22 PM.    Chief Complaint  Patient presents with  . Fall  . Hypotension   The history is provided by the patient. No language interpreter was used.    HPI Comments: Amber [name] is a 59 y.o. female with history of HTN who presents to the Emergency Department complaining of fall yesterday with resulting thoracic back pain where she had surgery 2 perviously; she states she felt a little dizzy before falling and then her legs gave out. She states she had the surgery 2 years ago in [place] and had hardware installed. She also notes bumping her head 2 days ago. She is found here to have hypotension. She states she has felt cold. She notes she used a muscle relaxer at 1730 today. She also reports history of seizures She states she is on 3 blood pressure medications but was not able to fill Metoprolol.   Past Medical History  Diagnosis Date  . Arthritis   . Asthma   . Hypertension   . Constipation   . GERD (gastroesophageal reflux disease)   . Diabetes mellitus without complication   . Chronic pain   . Seizures     last seizure March 2015   Past Surgical History  Procedure Laterality Date  . Back surgery    . Cholecystectomy    . Appendectomy     Family History  Problem Relation Age of Onset  . Kidney failure Mother   . Hypertension Sister   . Hypertension Brother    History  Substance Use Topics  . Smoking status: Former Games developer  . Smokeless tobacco: Never Used  . Alcohol Use: Yes     Comment: admits to 2 drinks/week   OB History    No data available     Review of Systems  Constitutional: Positive for chills.  Musculoskeletal: Positive for back pain.  Neurological: Positive for dizziness and headaches.  All other systems reviewed and are  negative.     Allergies  Ivp dye  Home Medications   Prior to Admission medications   Medication Sig Start Date End Date Taking? Authorizing Provider  albuterol (PROVENTIL HFA;VENTOLIN HFA) 108 (90 BASE) MCG/ACT inhaler Inhale 2 puffs into the lungs every 6 (six) hours as needed for wheezing or shortness of breath.   Yes Historical Provider, MD  esomeprazole (NEXIUM) 40 MG capsule Take 40 mg by mouth daily at 12 noon.   Yes Historical Provider, MD  Fluticasone-Salmeterol (ADVAIR) 250-50 MCG/DOSE AEPB Inhale 1 puff into the lungs 2 (two) times daily.   Yes Historical Provider, MD  folic acid (FOLVITE) 1 MG tablet Take 1 mg by mouth daily.   Yes Historical Provider, MD  metoprolol succinate (TOPROL-XL) 25 MG 24 hr tablet Take 25 mg by mouth daily.   Yes Historical Provider, MD  Oxycodone HCl 20 MG TABS Take 20 mg by mouth 4 (four) times daily as needed.   Yes Historical Provider, MD  oxymorphone (OPANA ER) 40 MG 12 hr tablet Take 40 mg by mouth every 12 (twelve) hours.   Yes Historical Provider, MD  triamterene-hydrochlorothiazide (MAXZIDE-25) 37.5-25 MG per tablet Take 1 tablet by mouth daily.   Yes Historical Provider, MD  VITAMIN E PO Take 1 capsule by mouth daily.  Yes Historical Provider, MD  dicloxacillin (DYNAPEN) 500 MG capsule Take 1 capsule (500 mg total) by mouth 3 (three) times daily. 01/27/15   Calvert Cantor, MD  levETIRAcetam (KEPPRA) 500 MG tablet Take 1 tablet (500 mg total) by mouth 2 (two) times daily. 01/27/15   Calvert Cantor, MD  potassium chloride SA (K-DUR,KLOR-CON) 20 MEQ tablet Take 2 tablets (40 mEq total) by mouth daily. 01/27/15   Calvert Cantor, MD  predniSONE (DELTASONE) 5 MG tablet Take 3 tablets (15 mg total) by mouth daily with breakfast. 01/27/15   Calvert Cantor, MD  tiZANidine (ZANAFLEX) 4 MG tablet Take 0.5 tablets (2 mg total) by mouth 2 (two) times daily as needed for muscle spasms. 01/27/15   Calvert Cantor, MD  Vitamin D, Ergocalciferol, (DRISDOL) 50000 UNITS CAPS  capsule Take 1 capsule (50,000 Units total) by mouth every 7 (seven) days. Sunday 01/27/15   Calvert Cantor, MD   BP 115/77 mmHg  Pulse 101  Temp(Src) 98.5 F (36.9 C) (Oral)  Resp 18  Ht 5' (1.524 m)  Wt 130 lb 11.7 oz (59.3 kg)  BMI 25.53 kg/m2  SpO2 97% Physical Exam  Constitutional: She is oriented to person, place, and time. She appears well-developed and well-nourished. No distress.  HENT:  Head: Normocephalic and atraumatic.  Eyes: Pupils are equal, round, and reactive to light.  Neck: Normal range of motion.  Cardiovascular: Normal rate and intact distal pulses.   Pulmonary/Chest: No respiratory distress.  Abdominal: Normal appearance. She exhibits no distension. There is no tenderness.  Musculoskeletal:       Back:  Neurological: She is alert and oriented to person, place, and time. No cranial nerve deficit.  Skin: Skin is warm and dry. No rash noted.  Psychiatric: She has a normal mood and affect. Her behavior is normal.  Nursing note and vitals reviewed.   ED Course  Procedures (including critical care time)  DIAGNOSTIC STUDIES: Oxygen Saturation is 100% on room air, normal by my interpretation.    COORDINATION OF CARE: 8:27 PM Blood pressure is improved after IV fluids, measured now at 102/73. Discussed treatment plan with patient at beside, the patient agrees with the plan and has no further questions at this time.   Labs Review Labs Reviewed  CBC WITH DIFFERENTIAL/PLATELET - Abnormal; Notable for the following:    Hemoglobin 10.4 (*)    HCT 30.8 (*)    MCV 75.7 (*)    MCH 25.6 (*)    All other components within normal limits  BASIC METABOLIC PANEL - Abnormal; Notable for the following:    Sodium 134 (*)    Potassium 2.9 (*)    Glucose, Bld 160 (*)    Calcium 7.3 (*)    GFR calc non Af Amer 67 (*)    GFR calc Af Amer 78 (*)    All other components within normal limits  URINALYSIS, ROUTINE W REFLEX MICROSCOPIC - Abnormal; Notable for the following:     Leukocytes, UA SMALL (*)    All other components within normal limits  URINE MICROSCOPIC-ADD ON - Abnormal; Notable for the following:    Squamous Epithelial / LPF FEW (*)    All other components within normal limits  TROPONIN I - Abnormal; Notable for the following:    Troponin I 0.04 (*)    All other components within normal limits  MAGNESIUM - Abnormal; Notable for the following:    Magnesium 1.1 (*)    All other components within normal limits  COMPREHENSIVE METABOLIC  PANEL - Abnormal; Notable for the following:    Calcium 7.9 (*)    Total Protein 5.8 (*)    Albumin 2.6 (*)    Alkaline Phosphatase 130 (*)    All other components within normal limits  CBC WITH DIFFERENTIAL/PLATELET - Abnormal; Notable for the following:    Hemoglobin 9.9 (*)    HCT 29.5 (*)    MCV 76.0 (*)    MCH 25.5 (*)    All other components within normal limits  GLUCOSE, CAPILLARY - Abnormal; Notable for the following:    Glucose-Capillary 104 (*)    All other components within normal limits  GLUCOSE, CAPILLARY - Abnormal; Notable for the following:    Glucose-Capillary 131 (*)    All other components within normal limits  BASIC METABOLIC PANEL - Abnormal; Notable for the following:    Glucose, Bld 229 (*)    Calcium 8.2 (*)    All other components within normal limits  GLUCOSE, CAPILLARY - Abnormal; Notable for the following:    Glucose-Capillary 228 (*)    All other components within normal limits  GLUCOSE, CAPILLARY - Abnormal; Notable for the following:    Glucose-Capillary 101 (*)    All other components within normal limits  GLUCOSE, CAPILLARY - Abnormal; Notable for the following:    Glucose-Capillary 185 (*)    All other components within normal limits  I-STAT CG4 LACTIC ACID, ED - Abnormal; Notable for the following:    Lactic Acid, Venous 2.54 (*)    All other components within normal limits  CBG MONITORING, ED - Abnormal; Notable for the following:    Glucose-Capillary 68 (*)    All  other components within normal limits  MRSA PCR SCREENING  TROPONIN I  GLUCOSE, CAPILLARY  CK  PHENYTOIN LEVEL, TOTAL  HEMOGLOBIN A1C  I-STAT CG4 LACTIC ACID, ED    Imaging Review . Results for orders placed or performed during the hospital encounter of 01/25/15  MRSA PCR Screening  Result Value Ref Range   MRSA by PCR NEGATIVE NEGATIVE  CBC with Differential  Result Value Ref Range   WBC 6.3 4.0 - 10.5 K/uL   RBC 4.07 3.87 - 5.11 MIL/uL   Hemoglobin 10.4 (L) 12.0 - 15.0 g/dL   HCT 16.1 (L) 09.6 - 04.5 %   MCV 75.7 (L) 78.0 - 100.0 fL   MCH 25.6 (L) 26.0 - 34.0 pg   MCHC 33.8 30.0 - 36.0 g/dL   RDW 40.9 81.1 - 91.4 %   Platelets 251 150 - 400 K/uL   Neutrophils Relative % 53 43 - 77 %   Neutro Abs 3.4 1.7 - 7.7 K/uL   Lymphocytes Relative 33 12 - 46 %   Lymphs Abs 2.1 0.7 - 4.0 K/uL   Monocytes Relative 11 3 - 12 %   Monocytes Absolute 0.7 0.1 - 1.0 K/uL   Eosinophils Relative 2 0 - 5 %   Eosinophils Absolute 0.1 0.0 - 0.7 K/uL   Basophils Relative 1 0 - 1 %   Basophils Absolute 0.0 0.0 - 0.1 K/uL  Basic metabolic panel  Result Value Ref Range   Sodium 134 (L) 135 - 145 mmol/L   Potassium 2.9 (L) 3.5 - 5.1 mmol/L   Chloride 106 96 - 112 mmol/L   CO2 23 19 - 32 mmol/L   Glucose, Bld 160 (H) 70 - 99 mg/dL   BUN 14 6 - 23 mg/dL   Creatinine, Ser 7.82 0.50 - 1.10 mg/dL   Calcium  7.3 (L) 8.4 - 10.5 mg/dL   GFR calc non Af Amer 67 (L) >90 mL/min   GFR calc Af Amer 78 (L) >90 mL/min   Anion gap 5 5 - 15  Urinalysis, Routine w reflex microscopic  Result Value Ref Range   Color, Urine YELLOW YELLOW   APPearance CLEAR CLEAR   Specific Gravity, Urine 1.013 1.005 - 1.030   pH 7.0 5.0 - 8.0   Glucose, UA NEGATIVE NEGATIVE mg/dL   Hgb urine dipstick NEGATIVE NEGATIVE   Bilirubin Urine NEGATIVE NEGATIVE   Ketones, ur NEGATIVE NEGATIVE mg/dL   Protein, ur NEGATIVE NEGATIVE mg/dL   Urobilinogen, UA 0.2 0.0 - 1.0 mg/dL   Nitrite NEGATIVE NEGATIVE   Leukocytes, UA SMALL  (A) NEGATIVE  Urine microscopic-add on  Result Value Ref Range   Squamous Epithelial / LPF FEW (A) RARE   WBC, UA 3-6 <3 WBC/hpf   Bacteria, UA RARE RARE  Troponin I  Result Value Ref Range   Troponin I 0.03 <0.031 ng/mL  Glucose, capillary  Result Value Ref Range   Glucose-Capillary 80 70 - 99 mg/dL  CK  Result Value Ref Range   Total CK 66 7 - 177 U/L  Troponin I  Result Value Ref Range   Troponin I 0.04 (H) <0.031 ng/mL  Phenytoin level, total  Result Value Ref Range   Phenytoin Lvl 17.6 10.0 - 20.0 ug/mL  Magnesium  Result Value Ref Range   Magnesium 1.1 (L) 1.5 - 2.5 mg/dL  Comprehensive metabolic panel  Result Value Ref Range   Sodium 140 135 - 145 mmol/L   Potassium 3.8 3.5 - 5.1 mmol/L   Chloride 112 96 - 112 mmol/L   CO2 20 19 - 32 mmol/L   Glucose, Bld 94 70 - 99 mg/dL   BUN 8 6 - 23 mg/dL   Creatinine, Ser 1.61 0.50 - 1.10 mg/dL   Calcium 7.9 (L) 8.4 - 10.5 mg/dL   Total Protein 5.8 (L) 6.0 - 8.3 g/dL   Albumin 2.6 (L) 3.5 - 5.2 g/dL   AST 32 0 - 37 U/L   ALT 16 0 - 35 U/L   Alkaline Phosphatase 130 (H) 39 - 117 U/L   Total Bilirubin 0.5 0.3 - 1.2 mg/dL   GFR calc non Af Amer >90 >90 mL/min   GFR calc Af Amer >90 >90 mL/min   Anion gap 8 5 - 15  CBC with Differential/Platelet  Result Value Ref Range   WBC 4.4 4.0 - 10.5 K/uL   RBC 3.88 3.87 - 5.11 MIL/uL   Hemoglobin 9.9 (L) 12.0 - 15.0 g/dL   HCT 09.6 (L) 04.5 - 40.9 %   MCV 76.0 (L) 78.0 - 100.0 fL   MCH 25.5 (L) 26.0 - 34.0 pg   MCHC 33.6 30.0 - 36.0 g/dL   RDW 81.1 91.4 - 78.2 %   Platelets 234 150 - 400 K/uL   Neutrophils Relative % 63 43 - 77 %   Neutro Abs 2.8 1.7 - 7.7 K/uL   Lymphocytes Relative 27 12 - 46 %   Lymphs Abs 1.2 0.7 - 4.0 K/uL   Monocytes Relative 7 3 - 12 %   Monocytes Absolute 0.3 0.1 - 1.0 K/uL   Eosinophils Relative 2 0 - 5 %   Eosinophils Absolute 0.1 0.0 - 0.7 K/uL   Basophils Relative 1 0 - 1 %   Basophils Absolute 0.0 0.0 - 0.1 K/uL  Hemoglobin A1c  Result Value  Ref Range  Hgb A1c MFr Bld 4.9 4.8 - 5.6 %   Mean Plasma Glucose 94 mg/dL  Glucose, capillary  Result Value Ref Range   Glucose-Capillary 104 (H) 70 - 99 mg/dL   Comment 1 Notify RN   Glucose, capillary  Result Value Ref Range   Glucose-Capillary 131 (H) 70 - 99 mg/dL   Comment 1 Capillary Specimen   Basic metabolic panel  Result Value Ref Range   Sodium 139 135 - 145 mmol/L   Potassium 4.4 3.5 - 5.1 mmol/L   Chloride 108 96 - 112 mmol/L   CO2 24 19 - 32 mmol/L   Glucose, Bld 229 (H) 70 - 99 mg/dL   BUN 7 6 - 23 mg/dL   Creatinine, Ser 9.37 0.50 - 1.10 mg/dL   Calcium 8.2 (L) 8.4 - 10.5 mg/dL   GFR calc non Af Amer >90 >90 mL/min   GFR calc Af Amer >90 >90 mL/min   Anion gap 7 5 - 15  Glucose, capillary  Result Value Ref Range   Glucose-Capillary 228 (H) 70 - 99 mg/dL   Comment 1 Notify RN   Glucose, capillary  Result Value Ref Range   Glucose-Capillary 101 (H) 70 - 99 mg/dL  Glucose, capillary  Result Value Ref Range   Glucose-Capillary 185 (H) 70 - 99 mg/dL  I-Stat CG4 Lactic Acid, ED  Result Value Ref Range   Lactic Acid, Venous 2.54 (HH) 0.5 - 2.0 mmol/L   Comment NOTIFIED PHYSICIAN   I-Stat CG4 Lactic Acid, ED  Result Value Ref Range   Lactic Acid, Venous 1.94 0.5 - 2.0 mmol/L  POC CBG, ED  Result Value Ref Range   Glucose-Capillary 68 (L) 70 - 99 mg/dL   Comment 1 Notify RN    Comment 2 Document in Chart    Ct Head Wo Contrast  01/26/2015   CLINICAL DATA:  59 year old diabetic hypertensive female with history of seizures post fall. Episode of hypotension. Initial encounter.  EXAM: CT HEAD WITHOUT CONTRAST  TECHNIQUE: Contiguous axial images were obtained from the base of the skull through the vertex without intravenous contrast.  COMPARISON:  None.  FINDINGS: No skull fracture or intracranial hemorrhage.  Nonspecific white matter type changes most likely related to result of small vessel disease in this diabetic hypertensive patient without CT evidence of large  acute infarct.  No hydrocephalus.  No intracranial mass lesion noted on this unenhanced exam.  Minimal mucosal thickening right maxillary sinus.  Exophthalmos.  IMPRESSION: No skull fracture or intracranial hemorrhage.  Small vessel disease type changes without CT evidence of large acute infarct.   Electronically Signed   By: Lacy Duverney M.D.   On: 01/26/2015 07:44   Ct Chest Wo Contrast  01/25/2015   CLINICAL DATA:  Fall yesterday with back pain, dizziness and hypotension. Allergy to iodinated contrast.  EXAM: CT CHEST WITHOUT CONTRAST  TECHNIQUE: Multidetector CT imaging of the chest was performed following the standard protocol without IV contrast.  COMPARISON:  CT of the abdomen and pelvis on 05/30/2014  FINDINGS: No evidence of pneumothorax, hemothorax or pulmonary consolidation. Mild scarring/ atelectasis present at both lung bases.  No evidence of mediastinal hematoma or fluid collection. The thoracic aorta shows atherosclerotic changes without evidence of aneurysm or surrounding hemorrhage. No pericardial effusion.  No airway obstruction.  No masses or enlarged lymph nodes are seen.  Old fractures are seen involving the right third, fourth and fifth ribs as well as left seventh and eighth ribs.  There is evidence  of prior extensive thoracic spine fusion and corpectomy across the T4, T5 and potentially T6 levels. Alignment of the spine appears normal. There is mild compression of the T12 vertebral body with roughly 25-30% loss of vertebral body height. This is chronic and stable when compared to the prior abdominal CT.  IMPRESSION: 1. No acute findings in the chest. 2. Old fractures of bilateral ribs, as above. 3. Extensive prior thoracic fusion and corpectomy. Old and stable compression fracture of the T12 vertebral body.   Electronically Signed   By: Irish Lack M.D.   On: 01/25/2015 22:06      EKG Interpretation None      CRITICAL CARE Performed by: Nelva Nay L Total critical care  time: 30 min Critical care time was exclusive of separately billable procedures and treating other patients. Critical care was necessary to treat or prevent imminent or life-threatening deterioration. Critical care was time spent personally by me on the following activities: development of treatment plan with patient and/or surrogate as well as nursing, discussions with consultants, evaluation of patient's response to treatment, examination of patient, obtaining history from patient or surrogate, ordering and performing treatments and interventions, ordering and review of laboratory studies, ordering and review of radiographic studies, pulse oximetry and re-evaluation of patient's condition.   [facility] transferred to [facility] for admission MDM   Final diagnoses:  Hypotension, unspecified hypotension type   I personally performed the services described in this documentation, which was scribed in my presence. The recorded information has been reviewed and is accurate.   Nelva Nay, MD 01/29/15 1044

## 2015-01-25 NOTE — ED Notes (Signed)
Back from CT, meds ordered per Dr. Radford Pax.

## 2015-01-25 NOTE — ED Notes (Signed)
Pt reports she "got dizzy and fell" on Saturday- reports she was in [place] when she fell and hit head on toilet paper holder- denies LOC- c/o back pain- states b/p usually runs 100/80; B/p 70s/40s in triage

## 2015-01-25 NOTE — ED Notes (Signed)
Fell yesterday, c/o L thoracic area pain, fell r/t legs gave out, was reaching for my walker and wasn't able to get to it, walker is too big to bring into b/r, h/o back pain, back surgery and "partial paralysis in LE", alert, NAD, calm, interactive, "thinks BP might be low d/t muscle relaxant", also mentions recent treatment and abx for nvd. Takes BP meds. Family at [facility].

## 2015-01-26 ENCOUNTER — Encounter (HOSPITAL_COMMUNITY): Payer: Self-pay | Admitting: *Deleted

## 2015-01-26 ENCOUNTER — Inpatient Hospital Stay (HOSPITAL_COMMUNITY): Payer: Medicaid Other

## 2015-01-26 DIAGNOSIS — Z8669 Personal history of other diseases of the nervous system and sense organs: Secondary | ICD-10-CM

## 2015-01-26 DIAGNOSIS — Z87898 Personal history of other specified conditions: Secondary | ICD-10-CM

## 2015-01-26 DIAGNOSIS — D509 Iron deficiency anemia, unspecified: Secondary | ICD-10-CM | POA: Diagnosis present

## 2015-01-26 DIAGNOSIS — I951 Orthostatic hypotension: Principal | ICD-10-CM

## 2015-01-26 DIAGNOSIS — I959 Hypotension, unspecified: Secondary | ICD-10-CM

## 2015-01-26 DIAGNOSIS — E876 Hypokalemia: Secondary | ICD-10-CM | POA: Diagnosis present

## 2015-01-26 LAB — CBG MONITORING, ED: Glucose-Capillary: 68 mg/dL — ABNORMAL LOW (ref 70–99)

## 2015-01-26 LAB — PHENYTOIN LEVEL, TOTAL: Phenytoin Lvl: 17.6 ug/mL (ref 10.0–20.0)

## 2015-01-26 LAB — COMPREHENSIVE METABOLIC PANEL
ALK PHOS: 130 U/L — AB (ref 39–117)
ALT: 16 U/L (ref 0–35)
ANION GAP: 8 (ref 5–15)
AST: 32 U/L (ref 0–37)
Albumin: 2.6 g/dL — ABNORMAL LOW (ref 3.5–5.2)
BILIRUBIN TOTAL: 0.5 mg/dL (ref 0.3–1.2)
BUN: 8 mg/dL (ref 6–23)
CHLORIDE: 112 mmol/L (ref 96–112)
CO2: 20 mmol/L (ref 19–32)
CREATININE: 0.67 mg/dL (ref 0.50–1.10)
Calcium: 7.9 mg/dL — ABNORMAL LOW (ref 8.4–10.5)
Glucose, Bld: 94 mg/dL (ref 70–99)
Potassium: 3.8 mmol/L (ref 3.5–5.1)
Sodium: 140 mmol/L (ref 135–145)
Total Protein: 5.8 g/dL — ABNORMAL LOW (ref 6.0–8.3)

## 2015-01-26 LAB — TROPONIN I: TROPONIN I: 0.04 ng/mL — AB (ref <0.031)

## 2015-01-26 LAB — CBC WITH DIFFERENTIAL/PLATELET
Basophils Absolute: 0 10*3/uL (ref 0.0–0.1)
Basophils Relative: 1 % (ref 0–1)
EOS ABS: 0.1 10*3/uL (ref 0.0–0.7)
Eosinophils Relative: 2 % (ref 0–5)
HEMATOCRIT: 29.5 % — AB (ref 36.0–46.0)
HEMOGLOBIN: 9.9 g/dL — AB (ref 12.0–15.0)
LYMPHS ABS: 1.2 10*3/uL (ref 0.7–4.0)
Lymphocytes Relative: 27 % (ref 12–46)
MCH: 25.5 pg — ABNORMAL LOW (ref 26.0–34.0)
MCHC: 33.6 g/dL (ref 30.0–36.0)
MCV: 76 fL — ABNORMAL LOW (ref 78.0–100.0)
Monocytes Absolute: 0.3 10*3/uL (ref 0.1–1.0)
Monocytes Relative: 7 % (ref 3–12)
NEUTROS ABS: 2.8 10*3/uL (ref 1.7–7.7)
NEUTROS PCT: 63 % (ref 43–77)
PLATELETS: 234 10*3/uL (ref 150–400)
RBC: 3.88 MIL/uL (ref 3.87–5.11)
RDW: 14.2 % (ref 11.5–15.5)
WBC: 4.4 10*3/uL (ref 4.0–10.5)

## 2015-01-26 LAB — GLUCOSE, CAPILLARY
GLUCOSE-CAPILLARY: 80 mg/dL (ref 70–99)
GLUCOSE-CAPILLARY: 104 mg/dL — AB (ref 70–99)
Glucose-Capillary: 131 mg/dL — ABNORMAL HIGH (ref 70–99)
Glucose-Capillary: 228 mg/dL — ABNORMAL HIGH (ref 70–99)

## 2015-01-26 LAB — CK: Total CK: 66 U/L (ref 7–177)

## 2015-01-26 LAB — MRSA PCR SCREENING: MRSA BY PCR: NEGATIVE

## 2015-01-26 LAB — MAGNESIUM: Magnesium: 1.1 mg/dL — ABNORMAL LOW (ref 1.5–2.5)

## 2015-01-26 MED ORDER — ACETAMINOPHEN 650 MG RE SUPP: 650.0000 mg | Freq: Four times a day (QID) | RECTAL | Status: DC | PRN

## 2015-01-26 MED ORDER — HYDROCORTISONE NA SUCCINATE PF 100 MG IJ SOLR
50.0000 mg | Freq: Three times a day (TID) | INTRAMUSCULAR | Status: DC
  Administered 2015-01-26: 50 mg via INTRAVENOUS
  Filled 2015-01-26 (×4): qty 1

## 2015-01-26 MED ORDER — POTASSIUM CHLORIDE CRYS ER 20 MEQ PO TBCR
40.0000 meq | EXTENDED_RELEASE_TABLET | Freq: Once | ORAL | Status: AC
  Administered 2015-01-26: 40 meq via ORAL
  Filled 2015-01-26: qty 2

## 2015-01-26 MED ORDER — ALBUTEROL SULFATE (2.5 MG/3ML) 0.083% IN NEBU: 2.5000 mg | INHALATION_SOLUTION | Freq: Four times a day (QID) | RESPIRATORY_TRACT | Status: DC | PRN

## 2015-01-26 MED ORDER — ALUM & MAG HYDROXIDE-SIMETH 200-200-20 MG/5ML PO SUSP
30.0000 mL | Freq: Once | ORAL | Status: AC
  Administered 2015-01-26: 30 mL via ORAL
  Filled 2015-01-26: qty 30

## 2015-01-26 MED ORDER — METOPROLOL SUCCINATE ER 25 MG PO TB24
25.0000 mg | ORAL_TABLET | Freq: Every day | ORAL | Status: DC
  Administered 2015-01-26 – 2015-01-27 (×2): 25 mg via ORAL
  Filled 2015-01-26 (×2): qty 1

## 2015-01-26 MED ORDER — MOMETASONE FURO-FORMOTEROL FUM 100-5 MCG/ACT IN AERO: 2.0000 | INHALATION_SPRAY | Freq: Two times a day (BID) | RESPIRATORY_TRACT | Status: DC

## 2015-01-26 MED ORDER — PREDNISONE 5 MG PO TABS
15.0000 mg | ORAL_TABLET | Freq: Every day | ORAL | Status: DC
  Administered 2015-01-27: 15 mg via ORAL
  Filled 2015-01-26 (×2): qty 1

## 2015-01-26 MED ORDER — POTASSIUM CHLORIDE IN NACL 20-0.9 MEQ/L-% IV SOLN
INTRAVENOUS | Status: AC
  Administered 2015-01-26 – 2015-01-27 (×3): via INTRAVENOUS
  Filled 2015-01-26 (×4): qty 1000

## 2015-01-26 MED ORDER — ACETAMINOPHEN 325 MG PO TABS
650.0000 mg | ORAL_TABLET | Freq: Four times a day (QID) | ORAL | Status: DC | PRN
  Administered 2015-01-26: 650 mg via ORAL
  Filled 2015-01-26: qty 2

## 2015-01-26 MED ORDER — LEVETIRACETAM 500 MG PO TABS
500.0000 mg | ORAL_TABLET | Freq: Two times a day (BID) | ORAL | Status: DC
  Administered 2015-01-26 – 2015-01-27 (×3): 500 mg via ORAL
  Filled 2015-01-26 (×6): qty 1

## 2015-01-26 MED ORDER — ENSURE COMPLETE PO LIQD
237.0000 mL | Freq: Three times a day (TID) | ORAL | Status: DC
  Administered 2015-01-26 – 2015-01-27 (×4): 237 mL via ORAL

## 2015-01-26 MED ORDER — ONDANSETRON HCL 4 MG/2ML IJ SOLN
4.0000 mg | Freq: Four times a day (QID) | INTRAMUSCULAR | Status: DC | PRN
  Administered 2015-01-26: 4 mg via INTRAVENOUS
  Filled 2015-01-26: qty 2

## 2015-01-26 MED ORDER — INSULIN ASPART 100 UNIT/ML ~~LOC~~ SOLN
0.0000 [IU] | Freq: Three times a day (TID) | SUBCUTANEOUS | Status: DC
  Administered 2015-01-26: 1 [IU] via SUBCUTANEOUS
  Administered 2015-01-26: 3 [IU] via SUBCUTANEOUS
  Administered 2015-01-27: 2 [IU] via SUBCUTANEOUS

## 2015-01-26 MED ORDER — OXYCODONE HCL 5 MG PO TABS
20.0000 mg | ORAL_TABLET | Freq: Four times a day (QID) | ORAL | Status: DC | PRN
  Administered 2015-01-26 – 2015-01-27 (×5): 20 mg via ORAL
  Filled 2015-01-26 (×5): qty 4

## 2015-01-26 MED ORDER — PANTOPRAZOLE SODIUM 40 MG PO TBEC
40.0000 mg | DELAYED_RELEASE_TABLET | Freq: Every day | ORAL | Status: DC
  Administered 2015-01-26 – 2015-01-27 (×2): 40 mg via ORAL
  Filled 2015-01-26: qty 1

## 2015-01-26 MED ORDER — HYDROCORTISONE NA SUCCINATE PF 100 MG IJ SOLR
50.0000 mg | Freq: Three times a day (TID) | INTRAMUSCULAR | Status: AC
Start: 2015-01-26 — End: 2015-01-26
  Administered 2015-01-26 (×2): 50 mg via INTRAVENOUS
  Filled 2015-01-26 (×2): qty 1

## 2015-01-26 MED ORDER — MOMETASONE FURO-FORMOTEROL FUM 100-5 MCG/ACT IN AERO
2.0000 | INHALATION_SPRAY | Freq: Two times a day (BID) | RESPIRATORY_TRACT | Status: DC
  Administered 2015-01-26 – 2015-01-27 (×3): 2 via RESPIRATORY_TRACT
  Filled 2015-01-26: qty 8.8

## 2015-01-26 MED ORDER — PHENYTOIN SODIUM EXTENDED 100 MG PO CAPS: 100.0000 mg | ORAL_CAPSULE | Freq: Four times a day (QID) | ORAL | Status: DC

## 2015-01-26 MED ORDER — FLUTICASONE-SALMETEROL 115-21 MCG/ACT IN AERO
2.0000 | INHALATION_SPRAY | Freq: Two times a day (BID) | RESPIRATORY_TRACT | Status: DC
  Filled 2015-01-26: qty 8

## 2015-01-26 MED ORDER — PANTOPRAZOLE SODIUM 40 MG IV SOLR
40.0000 mg | Freq: Once | INTRAVENOUS | Status: AC
  Administered 2015-01-26: 40 mg via INTRAVENOUS
  Filled 2015-01-26: qty 40

## 2015-01-26 MED ORDER — SODIUM CHLORIDE 0.9 % IV SOLN: INTRAVENOUS | Status: DC

## 2015-01-26 MED ORDER — OXYCODONE HCL 5 MG PO TABS
10.0000 mg | ORAL_TABLET | Freq: Once | ORAL | Status: AC
  Administered 2015-01-26: 10 mg via ORAL
  Filled 2015-01-26: qty 2

## 2015-01-26 MED ORDER — ONDANSETRON HCL 4 MG PO TABS: 4.0000 mg | ORAL_TABLET | Freq: Four times a day (QID) | ORAL | Status: DC | PRN

## 2015-01-26 MED ORDER — OXYMORPHONE HCL ER 10 MG PO T12A
40.0000 mg | EXTENDED_RELEASE_TABLET | Freq: Two times a day (BID) | ORAL | Status: DC
  Administered 2015-01-26 – 2015-01-27 (×3): 40 mg via ORAL
  Filled 2015-01-26 (×3): qty 4

## 2015-01-26 NOTE — ED Notes (Signed)
Snack not eaten, given apple juice, pt ambulatory to b/r with [facility], ambulatory to [facility] stretcher.

## 2015-01-26 NOTE — ED Notes (Signed)
Pt eating snack, PB and crackers.

## 2015-01-26 NOTE — Progress Notes (Addendum)
Patient admitted this AM. See H and P.   She c/o falls for about 1 wk. She was previously taking 20 mg of Prednisone daily - her PCP cut it down to 15 mg daily. Ran out of Prednisone 2 wks ago and PCP had not sent in a refill yet. I spoke with her PCP who states he did call in refills for her. He tells me he has her on it for severe Rheumatoid arthritis. 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 The patient thinks she is on the Prednisone for Asthma.   She ran out of her Metoprolol as well 2 wks ago and had no refills.  She further complains of severe nausea from Metformin. She states instead of changing the Metformin, her PCP started Phenergan which makes her feel weak and dizzy. Dr Charyl Dancer states states to d/c Metformin for now.   Active Problems:   Hypotension - BP 70s/40s in the ER- likely steroid withdrawal. Has been placed on stress dose steroids and is being hydrated with IVF - BP much improved now but still tachycardic- orthostatics now negative - stop Hydrocortisone tonight and start Prednisone tomorrow - resume Metoprolol  Tachycardia - will resume Metoprolol- cont to hydrate as well   Hypokalemia - takes Potassium at home but hypokalemia may be due to adrenal insufficiency as well - replacing K via IV and oral route   History of seizures - d/c Dilantin (on it per her neurologist) and start Keppra which is safer and more effective  DM - c/o nausea/ vomiting with Metformin- will d/c as mentioned above  - follow on sliding scale while here  Asthma - cont inhalers  Calvert Cantor, MD Pager: [email or web address]

## 2015-01-26 NOTE — Progress Notes (Signed)
MEDICATION RELATED CONSULT NOTE - INITIAL   Pharmacy Consult for Phenytoin  Indication: History of Seizures  Allergies  Allergen Reactions  . Ivp Dye [Iodinated Diagnostic Agents] Itching    Patient Measurements: Height: 5\' 5"  (165.1 cm) Weight: 123 lb 10.9 oz (56.1 kg) IBW/kg (Calculated) : 57  Labs:  Medical History: Past Medical History  Diagnosis Date  . Arthritis   . Asthma   . Hypertension   . Constipation   . GERD (gastroesophageal reflux disease)   . Diabetes mellitus without complication   . Chronic pain   . Seizures     last seizure March 2015    Assessment: 59 y/o F on phenytoin for h/o seizure presents with syncope. Pharmacy consulted to dose/maintain phenytoin. Pt states that she takes phenytoin 100 mg FOUR times daily at home.   Goal of Therapy:  Phenytoin level 10-20 mg/L  Plan: -Check phenytoin/albumin levels this AM -Continue with home dose if level is ok, if not, adjust dose as needed  -Would recommend verifying phenytoin dose with pharmacy when open  59 01/26/2015,4:02 AM

## 2015-01-26 NOTE — ED Notes (Signed)
C/o heartburn, Dr. Read Drivers aware. Orders received and initiated.

## 2015-01-26 NOTE — H&P (Signed)
[facility] History and Physical  Amber [name] EXB:[id number] DOB: 1956-07-03 DOA: 01/25/2015  Referring physician: Patient was transferred from [facility]. PCP: [name], MD   Chief Complaint: Fall.  HPI: Amber [name] is a 59 y.o. female with history of hypertension, seizures, nonspecific arthritis on chronic steroids, chronic anemia, hyperglycemia was brought to the ER the patient had 2 falls at her house. Patient states she had her first fall after she was in her bathroom and stood up from the commode she felt dizzy and she fell hitting her head and chest. She then walked to her room when she fell again. Patient was brought to the ER and was found to be hypotensive. Patient also is complaining of back pain. Patient has chronic pain and has had previous thoracic spine surgery. CT chest did not show anything acute. Patient was given normal saline bolus and has been admitted for further management. Patient takes chronic steroids for arthritis and has not taken it for last 1 week as she ran out of the medication. Patient denies any nausea vomiting diarrhea fever chills. Has been having chronic pains. Patient states when she fell she did have some bleeding from the scalp which stopped. She still has some headache.   Review of Systems: As presented in the history of presenting illness, rest negative.  Past Medical History  Diagnosis Date  . Arthritis   . Asthma   . Hypertension   . Constipation   . GERD (gastroesophageal reflux disease)   . Diabetes mellitus without complication   . Chronic pain   . Seizures     last seizure March 2015   Past Surgical History  Procedure Laterality Date  . Back surgery    . Cholecystectomy    . Appendectomy     Social History:  reports that she has quit smoking. She has never used smokeless tobacco. She reports that she drinks alcohol. She reports that she does not use illicit drugs. Where does patient live home. Can patient  participate in ADLs? Yes.  Allergies  Allergen Reactions  . Ivp Dye [Iodinated Diagnostic Agents] Itching    Family History:  Family History  Problem Relation Age of Onset  . Kidney failure Mother   . Hypertension Sister   . Hypertension Brother       Prior to Admission medications   Medication Sig Start Date End Date Taking? Authorizing Provider  ALBUTEROL IN Inhale into the lungs.   Yes Historical Provider, MD  AMLODIPINE BESYLATE PO Take by mouth.   Yes Historical Provider, MD  Atorvastatin Calcium (LIPITOR PO) Take by mouth.   Yes Historical Provider, MD  Fluticasone-Salmeterol (ADVAIR HFA IN) Inhale into the lungs.   Yes Historical Provider, MD  METFORMIN HCL PO Take by mouth.   Yes Historical Provider, MD  Metoprolol Succinate (TOPROL XL PO) Take by mouth.   Yes Historical Provider, MD  Oxycodone HCl 20 MG TABS Take 20 mg by mouth 4 (four) times daily as needed.   Yes Historical Provider, MD  oxymorphone (OPANA ER) 40 MG 12 hr tablet Take 40 mg by mouth every 12 (twelve) hours.   Yes Historical Provider, MD  PHENYTOIN PO Take by mouth.   Yes Historical Provider, MD  predniSONE (DELTASONE) 5 MG tablet Take 15 mg by mouth daily with breakfast.   Yes Historical Provider, MD  TRIAMTERENE PO Take by mouth.   Yes Historical Provider, MD  cephALEXin (KEFLEX) 500 MG capsule Take 1 capsule (500 mg total) by mouth  2 (two) times daily. 06/07/14   Nicole Pisciotta, PA-C  oxyCODONE (ROXICODONE) 15 MG immediate release tablet Take 20 mg by mouth every 4 (four) hours as needed for pain.     Historical Provider, MD  oxymorphone (OPANA ER) 20 MG 12 hr tablet Take 40 mg by mouth every 12 (twelve) hours.     Historical Provider, MD  phenazopyridine (PYRIDIUM) 200 MG tablet Take 1 tablet (200 mg total) by mouth 3 (three) times daily as needed for pain. 06/07/14   Nicole Pisciotta, PA-C  POTASSIUM CHLORIDE ER PO Take by mouth.    Historical Provider, MD  potassium chloride SA (K-DUR,KLOR-CON) 20 MEQ  tablet Take 2 tablets (40 mEq total) by mouth daily. 06/07/14   Nicole Pisciotta, PA-C  UNKNOWN TO PATIENT Seizure, cholesterol meds    Historical Provider, MD    Physical Exam: Filed Vitals:   01/26/15 0045 01/26/15 0100 01/26/15 0115 01/26/15 0200  BP: 105/71 113/83 121/76   Pulse: 92 91 98   Temp:      TempSrc:      Resp: 14 18 19    Height:    5\' 5"  (1.651 m)  Weight:    56.1 kg (123 lb 10.9 oz)  SpO2: 99% 98% 99%      General:  Moderately built and nourished.  Eyes: Anicteric no pallor.  ENT: No discharge from the otherwise normal.  Neck: No mass felt.  Cardiovascular: S1 and S2 heard.  Respiratory: No rhonchi or crepitations.  Abdomen: Soft nontender bowel sounds present.  Skin: No rash.  Musculoskeletal: No edema.  Psychiatric: Appears normal.  Neurologic: Alert awake oriented to time place and person. Moves all extremities.  Labs on Admission:  Basic Metabolic Panel:  Recent Labs Lab 01/25/15 1958  NA 134*  K 2.9*  CL 106  CO2 23  GLUCOSE 160*  BUN 14  CREATININE 0.93  CALCIUM 7.3*   Liver Function Tests: No results for input(s): AST, ALT, ALKPHOS, BILITOT, PROT, ALBUMIN in the last 168 hours. No results for input(s): LIPASE, AMYLASE in the last 168 hours. No results for input(s): AMMONIA in the last 168 hours. CBC:  Recent Labs Lab 01/25/15 1958  WBC 6.3  NEUTROABS 3.4  HGB 10.4*  HCT 30.8*  MCV 75.7*  PLT 251   Cardiac Enzymes:  Recent Labs Lab 01/25/15 2240  TROPONINI 0.03    BNP (last 3 results) No results for input(s): BNP in the last 8760 hours.  ProBNP (last 3 results) No results for input(s): PROBNP in the last 8760 hours.  CBG:  Recent Labs Lab 01/26/15 0100 01/26/15 0225  GLUCAP 68* 80    Radiological Exams on Admission: Ct Chest Wo Contrast  01/25/2015   CLINICAL DATA:  Fall yesterday with back pain, dizziness and hypotension. Allergy to iodinated contrast.  EXAM: CT CHEST WITHOUT CONTRAST  TECHNIQUE:  Multidetector CT imaging of the chest was performed following the standard protocol without IV contrast.  COMPARISON:  CT of the abdomen and pelvis on 05/30/2014  FINDINGS: No evidence of pneumothorax, hemothorax or pulmonary consolidation. Mild scarring/ atelectasis present at both lung bases.  No evidence of mediastinal hematoma or fluid collection. The thoracic aorta shows atherosclerotic changes without evidence of aneurysm or surrounding hemorrhage. No pericardial effusion.  No airway obstruction.  No masses or enlarged lymph nodes are seen.  Old fractures are seen involving the right third, fourth and fifth ribs as well as left seventh and eighth ribs.  There is evidence of prior extensive thoracic  spine fusion and corpectomy across the T4, T5 and potentially T6 levels. Alignment of the spine appears normal. There is mild compression of the T12 vertebral body with roughly 25-30% loss of vertebral body height. This is chronic and stable when compared to the prior abdominal CT.  IMPRESSION: 1. No acute findings in the chest. 2. Old fractures of bilateral ribs, as above. 3. Extensive prior thoracic fusion and corpectomy. Old and stable compression fracture of the T12 vertebral body.   Electronically Signed   By: Irish Lack M.D.   On: 01/25/2015 22:06     Assessment/Plan Active Problems:   Hypotension   Microcytic anemia   Hypokalemia   Hypertension   History of seizures   1. Hypotension - probably secondary to patient not taking steroids for last 1 week. Patient states he has been on prednisone for many years. At this time we will continue with IV fluids and keep patient on stress dose steroids. Once patient's blood pressure is stabilized patient can be changed back to her prednisone. Patient does not look septic. Hold antihypertensives for now. 2. Hypokalemia - probably from patient's diuretic use. Continue with potassium replacement and recheck metabolic panel. Check magnesium  levels. 3. History of hypertension - presently holding off antihypertensives secondary to #1. 4. Microcytic anemia - patient states he has chronic anemia. Follow CBC. If there is no significant pain and fall in hemoglobin then further workup as outpatient. 5. Falls - secondary to #1. Since patient has had hit her head and had mild bleeding will get a CT head. Patient appears nonfocal at this time. Physical therapy consult. 6. Chronic pain - continue home medications. 7. History of seizures - check Dilantin level. Dilantin dose per pharmacy. 8. History of asthma - presently not wheezing. 9. Hyperglycemia - probably secondary steroids. Check hemoglobin A1c and we will place patient on sliding-scale.  EKG is pending.   DVT Prophylaxis SCD.  Code Status: Full code.  Family Communication: None.  Disposition Plan: Admit to inpatient.    Oluwadamilola Rosamond N. [facility] Pager [phone].  If 7PM-7AM, please contact night-coverage [email or web address] Password [facility] 01/26/2015, 3:39 AM

## 2015-01-26 NOTE — Evaluation (Signed)
Physical Therapy Evaluation Patient Details Name: Amber [name] MRN: [id number] DOB: [birth year] Today's Date: 01/26/2015   History of Present Illness  Pt adm after falls at home and found to be hypotensive. PMH - OA, hypertension, seizures, DM  Clinical Impression  Pt admitted with above diagnosis. Pt currently with functional limitations due to the deficits listed below (see PT Problem List).  Pt will benefit from skilled PT to increase their independence and safety with mobility to allow discharge to the venue listed below.  After amb pt felt dizzy and very shaky. BP 140's/90's. Nurse in to check on pt.     Follow Up Recommendations Home health PT;Supervision - Intermittent    Equipment Recommendations  None recommended by PT    Recommendations for Other Services       Precautions / Restrictions Precautions Precautions: Fall      Mobility  Bed Mobility Overal bed mobility: Modified Independent             General bed mobility comments: Incr time  Transfers Overall transfer level: Needs assistance Equipment used: 4-wheeled walker Transfers: Sit to/from [employer] Transfers Sit to Stand: Min guard Stand pivot transfers: Min guard       General transfer comment: Assist for balance  Ambulation/Gait Ambulation/Gait assistance: Min guard Ambulation Distance (Feet): 190 Feet Assistive device: 4-wheeled walker Gait Pattern/deviations: Step-through pattern;Decreased stride length   Gait velocity interpretation: Below normal speed for age/gender General Gait Details: Slightly unsteady but no loss of balance using rollator  Stairs            Wheelchair Mobility    Modified Rankin (Stroke Patients Only)       Balance Overall balance assessment: Needs assistance Sitting-balance support: No upper extremity supported;Feet supported Sitting balance-Leahy Scale: Good     Standing balance support: No upper extremity supported Standing balance-Leahy  Scale: Fair Standing balance comment: Static standing without support but assist or rollator needed for any dynamic balance.                             Pertinent Vitals/Pain Pain Assessment: No/denies pain    Home Living Family/patient expects to be discharged to:: Private residence Living Arrangements: Other relatives (granddaughter) Available Help at Discharge: Family;Available PRN/intermittently Type of Home: House Home Access: Stairs to enter   [employer] of Steps: 1 Home Layout: One level Home Equipment: Walker - 4 wheels      Prior Function Level of Independence: Independent with assistive device(s)         Comments: Amb with rollator. History of falls.     Hand Dominance        Extremity/Trunk Assessment   Upper Extremity Assessment: Overall WFL for tasks assessed           Lower Extremity Assessment: Generalized weakness         Communication   Communication: No difficulties  Cognition Arousal/Alertness: Awake/alert Behavior During Therapy: WFL for tasks assessed/performed Overall Cognitive Status: Within Functional Limits for tasks assessed                      General Comments      Exercises        Assessment/Plan    PT Assessment    PT Diagnosis Difficulty walking;Generalized weakness   PT Problem List    PT Treatment Interventions     PT Goals (Current goals can be found in the Care Plan section)  Acute Rehab PT Goals Patient Stated Goal: return home PT Goal Formulation: With patient Time For Goal Achievement: 02/02/15 Potential to Achieve Goals: Good    Frequency     Barriers to discharge        Co-evaluation               End of Session Equipment Utilized During Treatment: Gait belt Activity Tolerance: Other (comment) (Pt tremulous and dizzy after treatment.) Patient left: in chair;with call bell/phone within reach;with chair alarm set;with family/visitor present Nurse  Communication: Mobility status;Other (comment) (Pt feeling shaky and dizzy.)         Time: 8841-6606 PT Time Calculation (min) (ACUTE ONLY): 33 min   Charges:   PT Evaluation $Initial PT Evaluation Tier I: 1 Procedure PT Treatments $Gait Training: 8-22 mins   PT G Codes:        Xian Apostol 2015/01/28, 2:09 PM  [facility] PT [phone]

## 2015-01-26 NOTE — Care Management Note (Addendum)
    Page 1 of 1   01/27/2015     10:12:47 AM CARE MANAGEMENT NOTE 01/27/2015  Patient:  Amber [name],Amber [name]   Account Number:  [id number]  Date Initiated:  01/26/2015  Documentation initiated by:  Junius Creamer  Subjective/Objective Assessment:   adm w hypotension     Action/Plan:   lives w fam, pcp dr Charyl Dancer sun   Anticipated DC Date:  01/27/2015   Anticipated DC Plan:  HOME W HOME HEALTH SERVICES      DC Planning Services  CM consult      [facility] Choice  HOME HEALTH   Choice offered to / List presented to:  C-1 Patient        HH arranged  HH-1 RN      [facility] agency  [facility] Care   Status of service:  Completed, signed off Medicare Important Message given?  NO (If response is "NO", the following Medicare IM given date fields will be blank) Date Medicare IM given:   Medicare IM given by:   Date Additional Medicare IM given:   Additional Medicare IM given by:    Discharge Disposition:  HOME W HOME HEALTH SERVICES  Per UR Regulation:  Reviewed for med. necessity/level of care/duration of stay  If discussed at Long Length of Stay Meetings, dates discussed:    Comments:  01/27/15 1011 Amber Cape RN, BSN [phone] patient for dc today, she chose [facility] for [facility], referral made, Kathlene November notified.  Soc will begin 24-48 hrs post dc.

## 2015-01-26 NOTE — ED Notes (Signed)
Pt updated about wait for bed assignment and transfer, given ice chips per request, reports continued chronic/ acute pain, heartburn med given, denies other sx or complaints, "feels better", VSS, given extra pillow and warm blanket.

## 2015-01-26 NOTE — Progress Notes (Signed)
Pt received om 5W rm 02 via recliner, pt a/o x3, spouse at bedside.

## 2015-01-26 NOTE — Progress Notes (Signed)
INITIAL NUTRITION ASSESSMENT  DOCUMENTATION CODES Per approved criteria  -Not Applicable   INTERVENTION: -Ensure Complete TID providing 350 kcal and 13 g protein each -Monitor PO intake  NUTRITION DIAGNOSIS: Inadequate oral intake related to decreased appetite as evidenced by pt report.   Goal: Pt to meet >/= 90% of estimated needs  Monitor:  -PO intake, weight trends, labs  Reason for Assessment: MST 2  59 y.o. female  Admitting Dx: Hypotension  ASSESSMENT: Pt with history of HTN, seizures, arthritis, on chronic steroids, chronic anemia, hyperglycemia and chronic pain.  Has had 2 falls at home and is hypotensive.   Pt reports usual body weight of 154 lbs and weighed this 1/16 before she states has been in an out of the hospital. Per weight records pt has had 14% weight loss in the past 8 months (not significant for time frame).    Pt reports drinking 6 ensure or boost supplements a day because she has not been able to eat well due to meds and stress.  Pt states she will sit down to eat and all of a sudden her food become unappetizing and this has been going on for 3-4 months.  She drinks supplements to make up for decreased intake. Will order Ensure Complete TID. Encouraged PO intake of meals but also informed pt she can ask her nurse for more Ensure if she wants it.  Pt complains of back pain from previous surgery and stress from her home life.   Nutrition Focused Physical Exam: no signs of fat or muscle wasting    Height: Ht Readings from Last 1 Encounters:  01/26/15 5\' 5"  (1.651 m)    Weight: Wt Readings from Last 1 Encounters:  01/26/15 123 lb 10.9 oz (56.1 kg)    Ideal Body Weight: 130 lbs   % Ideal Body Weight: 95%  Wt Readings from Last 10 Encounters:  01/26/15 123 lb 10.9 oz (56.1 kg)  06/07/14 136 lb 6.4 oz (61.871 kg)  05/29/14 143 lb (64.864 kg)    Usual Body Weight: 154 lbs  % Usual Body Weight: 80%  BMI:  Body mass index is 20.58  kg/(m^2).  Estimated Nutritional Needs: Kcal: 1500-1700 kcal Protein: 70-85 g protein Fluid: >/= 1.5 L  Skin: WDL  Diet Order: Diet heart healthy/carb modified  EDUCATION NEEDS: -No education needs identified at this time   Intake/Output Summary (Last 24 hours) at 01/26/15 0942 Last data filed at 01/26/15 0900  Gross per 24 hour  Intake   2495 ml  Output   2400 ml  Net     95 ml    Last BM: PTA   Labs:   Recent Labs Lab 01/25/15 1958 01/26/15 0620  NA 134* 140  K 2.9* 3.8  CL 106 112  CO2 23 20  BUN 14 8  CREATININE 0.93 0.67  CALCIUM 7.3* 7.9*  MG  --  1.1*  GLUCOSE 160* 94    CBG (last 3)   Recent Labs  01/26/15 0100 01/26/15 0225  GLUCAP 68* 80    Scheduled Meds: . hydrocortisone sod succinate (SOLU-CORTEF) inj  50 mg Intravenous 3 times per day  . insulin aspart  0-9 Units Subcutaneous TID WC  . mometasone-formoterol  2 puff Inhalation BID  . oxymorphone  40 mg Oral BID    Continuous Infusions: . 0.9 % NaCl with KCl 20 mEq / L 100 mL/hr at 01/26/15 0403    Past Medical History  Diagnosis Date  . Arthritis   .  Asthma   . Hypertension   . Constipation   . GERD (gastroesophageal reflux disease)   . Diabetes mellitus without complication   . Chronic pain   . Seizures     last seizure March 2015    Past Surgical History  Procedure Laterality Date  . Back surgery    . Cholecystectomy    . Appendectomy      Magdalen Spatz MS Dietetic Intern Pager Number [phone]

## 2015-01-27 DIAGNOSIS — E118 Type 2 diabetes mellitus with unspecified complications: Secondary | ICD-10-CM

## 2015-01-27 LAB — GLUCOSE, CAPILLARY
Glucose-Capillary: 101 mg/dL — ABNORMAL HIGH (ref 70–99)
Glucose-Capillary: 185 mg/dL — ABNORMAL HIGH (ref 70–99)

## 2015-01-27 LAB — BASIC METABOLIC PANEL
ANION GAP: 7 (ref 5–15)
BUN: 7 mg/dL (ref 6–23)
CHLORIDE: 108 mmol/L (ref 96–112)
CO2: 24 mmol/L (ref 19–32)
Calcium: 8.2 mg/dL — ABNORMAL LOW (ref 8.4–10.5)
Creatinine, Ser: 0.67 mg/dL (ref 0.50–1.10)
GFR calc Af Amer: >90 mL/min (ref >90)
GFR calc non Af Amer: >90 mL/min (ref >90)
GLUCOSE: 229 mg/dL — AB (ref 70–99)
Potassium: 4.4 mmol/L (ref 3.5–5.1)
Sodium: 139 mmol/L (ref 135–145)

## 2015-01-27 LAB — HEMOGLOBIN A1C
HEMOGLOBIN A1C: 4.9 % (ref 4.8–5.6)
Mean Plasma Glucose: 94 mg/dL

## 2015-01-27 MED ORDER — LEVETIRACETAM 500 MG PO TABS: 500.0000 mg | ORAL_TABLET | Freq: Two times a day (BID) | ORAL | Status: DC

## 2015-01-27 MED ORDER — DICLOXACILLIN SODIUM 500 MG PO CAPS: 500.0000 mg | ORAL_CAPSULE | Freq: Three times a day (TID) | ORAL | Status: DC

## 2015-01-27 MED ORDER — PREDNISONE 5 MG PO TABS: 15.0000 mg | ORAL_TABLET | Freq: Every day | ORAL | Status: DC

## 2015-01-27 MED ORDER — ALUM & MAG HYDROXIDE-SIMETH 200-200-20 MG/5ML PO SUSP: 15.0000 mL | Freq: Four times a day (QID) | ORAL | Status: DC | PRN

## 2015-01-27 MED ORDER — DIPHENHYDRAMINE HCL 25 MG PO CAPS
25.0000 mg | ORAL_CAPSULE | Freq: Three times a day (TID) | ORAL | Status: DC | PRN
  Administered 2015-01-27: 25 mg via ORAL
  Filled 2015-01-27: qty 1

## 2015-01-27 MED ORDER — VITAMIN D (ERGOCALCIFEROL) 1.25 MG (50000 UNIT) PO CAPS: 50000.0000 [IU] | ORAL_CAPSULE | ORAL | Status: DC

## 2015-01-27 MED ORDER — TIZANIDINE HCL 4 MG PO TABS: 2.0000 mg | ORAL_TABLET | Freq: Two times a day (BID) | ORAL | Status: DC | PRN

## 2015-01-27 MED ORDER — POTASSIUM CHLORIDE CRYS ER 20 MEQ PO TBCR
40.0000 meq | EXTENDED_RELEASE_TABLET | Freq: Every day | ORAL | Status: DC
Start: 2015-01-27 — End: 2016-08-22

## 2015-01-27 MED ORDER — POLYETHYLENE GLYCOL 3350 17 G PO PACK
17.0000 g | PACK | Freq: Every day | ORAL | Status: DC | PRN
  Administered 2015-01-27: 17 g via ORAL
  Filled 2015-01-27 (×2): qty 1

## 2015-01-27 NOTE — Progress Notes (Signed)
Nsg Discharge Note  Admit Date:  01/25/2015 Discharge date: 01/27/2015   Amoura Ransier to be D/C'd Home with home health to follow up per MD order.  AVS completed.  Copy for chart, and copy for patient signed, and dated. Patient/caregiver able to verbalize understanding.  Discharge Medication:   Medication List    STOP taking these medications        amLODipine 10 MG tablet  Commonly known as:  NORVASC     cephALEXin 500 MG capsule  Commonly known as:  KEFLEX     metFORMIN 500 MG 24 hr tablet  Commonly known as:  GLUCOPHAGE-XR     phenazopyridine 200 MG tablet  Commonly known as:  PYRIDIUM     phenytoin 100 MG ER capsule  Commonly known as:  DILANTIN     promethazine 25 MG tablet  Commonly known as:  PHENERGAN      TAKE these medications        albuterol 108 (90 BASE) MCG/ACT inhaler  Commonly known as:  PROVENTIL HFA;VENTOLIN HFA  Inhale 2 puffs into the lungs every 6 (six) hours as needed for wheezing or shortness of breath.     dicloxacillin 500 MG capsule  Commonly known as:  DYNAPEN  Take 1 capsule (500 mg total) by mouth 3 (three) times daily.     esomeprazole 40 MG capsule  Commonly known as:  NEXIUM  Take 40 mg by mouth daily at 12 noon.     Fluticasone-Salmeterol 250-50 MCG/DOSE Aepb  Commonly known as:  ADVAIR  Inhale 1 puff into the lungs 2 (two) times daily.     folic acid 1 MG tablet  Commonly known as:  FOLVITE  Take 1 mg by mouth daily.     levETIRAcetam 500 MG tablet  Commonly known as:  KEPPRA  Take 1 tablet (500 mg total) by mouth 2 (two) times daily.     metoprolol succinate 25 MG 24 hr tablet  Commonly known as:  TOPROL-XL  Take 25 mg by mouth daily.     Oxycodone HCl 20 MG Tabs  Take 20 mg by mouth 4 (four) times daily as needed.     oxymorphone 40 MG 12 hr tablet  Commonly known as:  OPANA ER  Take 40 mg by mouth every 12 (twelve) hours.     potassium chloride SA 20 MEQ tablet  Commonly known as:  K-DUR,KLOR-CON  Take 2  tablets (40 mEq total) by mouth daily.     predniSONE 5 MG tablet  Commonly known as:  DELTASONE  Take 3 tablets (15 mg total) by mouth daily with breakfast.     tiZANidine 4 MG tablet  Commonly known as:  ZANAFLEX  Take 0.5 tablets (2 mg total) by mouth 2 (two) times daily as needed for muscle spasms.     triamterene-hydrochlorothiazide 37.5-25 MG per tablet  Commonly known as:  MAXZIDE-25  Take 1 tablet by mouth daily.     Vitamin D (Ergocalciferol) 50000 UNITS Caps capsule  Commonly known as:  DRISDOL  Take 1 capsule (50,000 Units total) by mouth every 7 (seven) days. Sunday     VITAMIN E PO  Take 1 capsule by mouth daily.        Discharge Assessment: Filed Vitals:   01/27/15 0554  BP: 115/77  Pulse: 101  Temp: 98.5 F (36.9 C)  Resp: 18   Skin clean, dry and intact without evidence of skin break down, no evidence of skin tears noted. IV catheter discontinued  intact. Site without signs and symptoms of complications - no redness or edema noted at insertion site, patient denies c/o pain - only slight tenderness at site.  Dressing with slight pressure applied.  D/c Instructions-Education: Discharge instructions given to patient/family with verbalized understanding. D/c education completed with patient/family including follow up instructions, medication list, d/c activities limitations if indicated, with other d/c instructions as indicated by MD - patient able to verbalize understanding, all questions fully answered. Patient instructed to return to ED, call 911, or call MD for any changes in condition.  Patient escorted via WC, and D/C home via private auto.  Kern Reap, RN 01/27/2015 10:35 AM

## 2015-01-27 NOTE — Progress Notes (Signed)
Physical Therapy Treatment Patient Details Name: Amber [name] MRN: [id number] DOB: [birth year] Today's Date: 01/27/2015    History of Present Illness Pt adm after falls at home and found to be hypotensive. PMH - OA, hypertension, seizures, DM    PT Comments    Pt admitted with above diagnosis. Pt currently with functional limitations due to balance, dizziness and endurance deficits. Pt responded well to Epley maneuver and gave pt and son handouts for exercises to complete treatment prn.  Probable d/c today.   Pt will benefit from skilled PT to increase their independence and safety with mobility to allow discharge to the venue listed below.    Follow Up Recommendations  Home health PT;Supervision - Intermittent     Equipment Recommendations  None recommended by PT    Recommendations for Other Services       Precautions / Restrictions Precautions Precautions: Fall Restrictions Weight Bearing Restrictions: No    Mobility  Bed Mobility Overal bed mobility: Modified Independent             General bed mobility comments: Incr time  Transfers Overall transfer level: Needs assistance Equipment used: 4-wheeled walker Transfers: Sit to/from Stand Sit to Stand: Min guard         General transfer comment: Assist for balance  Ambulation/Gait Ambulation/Gait assistance: Min assist Ambulation Distance (Feet): 15 Feet Assistive device: 1 person hand held assist Gait Pattern/deviations: Decreased stride length;Shuffle   Gait velocity interpretation: Below normal speed for age/gender General Gait Details: Definitely unsteady without RW.  Needs bil UE support.    Stairs            Wheelchair Mobility    Modified Rankin (Stroke Patients Only)       Balance           Standing balance support: No upper extremity supported;During functional activity Standing balance-Leahy Scale: Fair Standing balance comment: can stand statically without UE support but  requires UE support for dynamic activity.                      Cognition Arousal/Alertness: Awake/alert Behavior During Therapy: WFL for tasks assessed/performed Overall Cognitive Status: Within Functional Limits for tasks assessed                      Exercises Other Exercises Other Exercises: Austin Miles exercise - pt instructed to perform as needed if dizziness persists.  Pt and son understand instruction.    General Comments General comments (skin integrity, edema, etc.): Tested pt for BPPV all canals with positive test for right BPPV. Treated via Epley maneuver for right BPPV with good results.  Pt noted a difference after treatment.  Gave precautions of sleeping on 2-3 pillows.  Gave all handouts re: BPPV explanation, picture of Epley and Austin Miles exercise.  Pt and son very appreciative.        Pertinent Vitals/Pain Pain Assessment: No/denies pain  VSS    Home Living                      Prior Function            PT Goals (current goals can now be found in the care plan section) Progress towards PT goals: Progressing toward goals    Frequency       PT Plan Current plan remains appropriate    Co-evaluation             End of Session  Equipment Utilized During Treatment: Gait belt Activity Tolerance: Patient tolerated treatment well Patient left: in bed;with call bell/phone within reach;with family/visitor present     Time: 1012-1036 PT Time Calculation (min) (ACUTE ONLY): 24 min  Charges:  $Self Care/Home Management: 8-22 $Canalith Rep Proc: 8-22 mins                    G Codes:      Tawni Millers F 02/09/2015, 11:47 AM [name] [phone] (pager)

## 2015-01-27 NOTE — Discharge Summary (Addendum)
Physician Discharge Summary  Amber [name] QQP:[id number] DOB: 08-04-1956 DOA: 01/25/2015  PCP: [name], MD  Admit date: 01/25/2015 Discharge date: 01/27/2015  Time spent: 45 minutes  Recommendations for Outpatient Follow-up:  1. HHRN to ensure she is taking medications properly  Discharge Condition: stable Diet recommendation: heart healthy, carb modified  Discharge Diagnoses:  Principal Problem:   Hypotension Active Problems:   Microcytic anemia   Hypokalemia   Hypertension   History of seizures   DM type 2 causing complication  HPI: obtained by Dr Toniann Fail on 3/7 Amber [name] is a 59 y.o. female with history of hypertension, seizures, nonspecific arthritis on chronic steroids, chronic anemia, hyperglycemia was brought to the ER the patient had 2 falls at her house. Patient states she had her first fall after she was in her bathroom and stood up from the commode she felt dizzy and she fell hitting her head and chest. She then walked to her room when she fell again. Patient was brought to the ER and was found to be hypotensive. Patient also is complaining of back pain. Patient has chronic pain and has had previous thoracic spine surgery. CT chest did not show anything acute. Patient was given normal saline bolus and has been admitted for further management. Patient takes chronic steroids for arthritis and has not taken it for last 1 week as she ran out of the medication. Patient denies any nausea vomiting diarrhea fever chills. Has been having chronic pains. Patient states when she fell she did have some bleeding from the scalp which stopped. She still has some headache.    Hospital course:  Further history obtained by myself the following day: She c/o falls for about 1 wk. She was previously taking 20 mg of Prednisone daily - her PCP cut it down to 15 mg daily. Ran out of Prednisone 2 wks ago and PCP had not sent in a refill yet. I spoke with her PCP who states he did call in refills  for her. He tells me he has her on it for severe Rheumatoid arthritis. 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 He has had her on chronic prednisone ever since. The patient thinks she is on the Prednisone for Asthma.  She ran out of her Metoprolol as well 2 wks ago and had no refills.  She further complains of severe nausea from Metformin. She states instead of changing the Metformin, her PCP started Phenergan which makes her feel weak and dizzy. Dr Charyl Dancer states states to d/c Metformin for now.   Further history obtained today: She tells me today that she is also out of her Dicloxacillin and her Potassium. She claims she contacted her PCP's office multiple times for refills but received refills.   Overall, it seems that she was out of a number of her medications which prompted this admission. She has also lost a great deal of weight due to inability to eat and vomiting which she suspects was from Metformin.   Active Problems:  Orthostatic Hypotension - BP 70s/40s in the ER- likely steroid withdrawal. was placed on stress dose steroids was hydrated with IVF - BP much improved now - orthostatics now negative - started back on 15 mg of Prednisone today  Tachycardia - due to steroid withdrawal- improved- B blocker resumed as well  HTN - BP low normal therefore only resumed Metoprolol and will hold Norvasc and Triam/ HCTZ - needs BP check in 1  wk  Hypokalemia - is ordered to take Potassium but had ran out it as well for 1 mo - adrenal insufficiency likely worsened her hypokalemia - replaced K via IV and oral route- now normal- re-prescribed K for her  History of seizures - d/c Dilantin (on it per her neurologist) and start Keppra which is safer and more effective- patient explained the reason for this transition- Dr Wynelle Link in agreement  DM - A1c 4.9 therefore, technically , she is no longer a diabetic -she had  complaints of severe nausea/ vomiting with Metformin- have d/c'd it as mentioned above - discussed with Dr Wynelle Link  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- have given new prescription as she as out of it  Chronic pain/ h/o RA - cont chronic steroids and pain meds prescribed by her PCP  Asthma - cont inhalers   Discharge Exam: Filed Weights   01/25/15 1912 01/26/15 0200 01/26/15 2100  Weight: 57.607 kg (127 lb) 56.1 kg (123 lb 10.9 oz) 59.3 kg (130 lb 11.7 oz)   Filed Vitals:   01/27/15 0554  BP: 115/77  Pulse: 101  Temp: 98.5 F (36.9 C)  Resp: 18    General: AAO x 3, no distress Cardiovascular: RRR, no murmurs  Respiratory: clear to auscultation bilaterally GI: soft, non-tender, non-distended, bowel sound positive  Discharge Instructions You were cared for by a hospitalist during your hospital stay. If you have any questions about your discharge medications or the care you received while you were in the hospital after you are discharged, you can call the unit and asked to speak with the hospitalist on call if the hospitalist that took care of you is not available. Once you are discharged, your primary care physician will handle any further medical issues. Please note that NO REFILLS for any discharge medications will be authorized once you are discharged, as it is imperative that you return to your primary care physician (or establish a relationship with a primary care physician if you do not have one) for your aftercare needs so that they can reassess your need for medications and monitor your lab values.      Discharge Instructions    Diet - low sodium heart healthy    Complete by:  As directed      Discharge instructions    Complete by:  As directed   See PCP in 1 wk- you will need a Bmet (blood work)  Strict diabetic diet, heart healthy     Increase activity slowly    Complete by:  As directed      Increase activity slowly    Complete  by:  As directed             Medication List    STOP taking these medications        amLODipine 10 MG tablet  Commonly known as:  NORVASC     cephALEXin 500 MG capsule  Commonly known as:  KEFLEX     metFORMIN 500 MG 24 hr tablet  Commonly known as:  GLUCOPHAGE-XR     phenazopyridine 200 MG tablet  Commonly known as:  PYRIDIUM     phenytoin 100 MG ER capsule  Commonly known as:  DILANTIN     promethazine 25 MG tablet  Commonly known as:  PHENERGAN      TAKE these medications        albuterol 108 (90 BASE) MCG/ACT inhaler  Commonly known as:  PROVENTIL HFA;VENTOLIN  HFA  Inhale 2 puffs into the lungs every 6 (six) hours as needed for wheezing or shortness of breath.     dicloxacillin 500 MG capsule  Commonly known as:  DYNAPEN  Take 1 capsule (500 mg total) by mouth 3 (three) times daily.     esomeprazole 40 MG capsule  Commonly known as:  NEXIUM  Take 40 mg by mouth daily at 12 noon.     Fluticasone-Salmeterol 250-50 MCG/DOSE Aepb  Commonly known as:  ADVAIR  Inhale 1 puff into the lungs 2 (two) times daily.     folic acid 1 MG tablet  Commonly known as:  FOLVITE  Take 1 mg by mouth daily.     levETIRAcetam 500 MG tablet  Commonly known as:  KEPPRA  Take 1 tablet (500 mg total) by mouth 2 (two) times daily.     metoprolol succinate 25 MG 24 hr tablet  Commonly known as:  TOPROL-XL  Take 25 mg by mouth daily.     Oxycodone HCl 20 MG Tabs  Take 20 mg by mouth 4 (four) times daily as needed.     oxymorphone 40 MG 12 hr tablet  Commonly known as:  OPANA ER  Take 40 mg by mouth every 12 (twelve) hours.     potassium chloride SA 20 MEQ tablet  Commonly known as:  K-DUR,KLOR-CON  Take 2 tablets (40 mEq total) by mouth daily.     predniSONE 5 MG tablet  Commonly known as:  DELTASONE  Take 3 tablets (15 mg total) by mouth daily with breakfast.     tiZANidine 4 MG tablet  Commonly known as:  ZANAFLEX  Take 0.5 tablets (2 mg total) by mouth 2 (two)  times daily as needed for muscle spasms.     triamterene-hydrochlorothiazide 37.5-25 MG per tablet  Commonly known as:  MAXZIDE-25  Take 1 tablet by mouth daily.     Vitamin D (Ergocalciferol) 50000 UNITS Caps capsule  Commonly known as:  DRISDOL  Take 1 capsule (50,000 Units total) by mouth every 7 (seven) days. Sunday     VITAMIN E PO  Take 1 capsule by mouth daily.       Allergies  Allergen Reactions  . Ivp Dye [Iodinated Diagnostic Agents] Itching      The results of significant diagnostics from this hospitalization (including imaging, microbiology, ancillary and laboratory) are listed below for reference.    Significant Diagnostic Studies: Ct Head Wo Contrast  01/26/2015   CLINICAL DATA:  59 year old diabetic hypertensive female with history of seizures post fall. Episode of hypotension. Initial encounter.  EXAM: CT HEAD WITHOUT CONTRAST  TECHNIQUE: Contiguous axial images were obtained from the base of the skull through the vertex without intravenous contrast.  COMPARISON:  None.  FINDINGS: No skull fracture or intracranial hemorrhage.  Nonspecific white matter type changes most likely related to result of small vessel disease in this diabetic hypertensive patient without CT evidence of large acute infarct.  No hydrocephalus.  No intracranial mass lesion noted on this unenhanced exam.  Minimal mucosal thickening right maxillary sinus.  Exophthalmos.  IMPRESSION: No skull fracture or intracranial hemorrhage.  Small vessel disease type changes without CT evidence of large acute infarct.   Electronically Signed   By: Lacy Duverney M.D.   On: 01/26/2015 07:44   Ct Chest Wo Contrast  01/25/2015   CLINICAL DATA:  Fall yesterday with back pain, dizziness and hypotension. Allergy to iodinated contrast.  EXAM: CT CHEST WITHOUT CONTRAST  TECHNIQUE: Multidetector  CT imaging of the chest was performed following the standard protocol without IV contrast.  COMPARISON:  CT of the abdomen and pelvis  on 05/30/2014  FINDINGS: No evidence of pneumothorax, hemothorax or pulmonary consolidation. Mild scarring/ atelectasis present at both lung bases.  No evidence of mediastinal hematoma or fluid collection. The thoracic aorta shows atherosclerotic changes without evidence of aneurysm or surrounding hemorrhage. No pericardial effusion.  No airway obstruction.  No masses or enlarged lymph nodes are seen.  Old fractures are seen involving the right third, fourth and fifth ribs as well as left seventh and eighth ribs.  There is evidence of prior extensive thoracic spine fusion and corpectomy across the T4, T5 and potentially T6 levels. Alignment of the spine appears normal. There is mild compression of the T12 vertebral body with roughly 25-30% loss of vertebral body height. This is chronic and stable when compared to the prior abdominal CT.  IMPRESSION: 1. No acute findings in the chest. 2. Old fractures of bilateral ribs, as above. 3. Extensive prior thoracic fusion and corpectomy. Old and stable compression fracture of the T12 vertebral body.   Electronically Signed   By: Irish Lack M.D.   On: 01/25/2015 22:06    Microbiology: Recent Results (from the past 240 hour(s))  MRSA PCR Screening     Status: None   Collection Time: 01/26/15  2:19 AM  Result Value Ref Range Status   MRSA by PCR NEGATIVE NEGATIVE Final    Comment:        The GeneXpert MRSA Assay (FDA approved for NASAL specimens only), is one component of a comprehensive MRSA colonization surveillance program. It is not intended to diagnose MRSA infection nor to guide or monitor treatment for MRSA infections.      Labs: Basic Metabolic Panel:  Recent Labs Lab 01/25/15 1958 01/26/15 0620 01/27/15 0721  NA 134* 140 139  K 2.9* 3.8 4.4  CL 106 112 108  CO2 23 20 24   GLUCOSE 160* 94 229*  BUN 14 8 7   CREATININE 0.93 0.67 0.67  CALCIUM 7.3* 7.9* 8.2*  MG  --  1.1*  --    Liver Function Tests:  Recent Labs Lab  01/26/15 0620  AST 32  ALT 16  ALKPHOS 130*  BILITOT 0.5  PROT 5.8*  ALBUMIN 2.6*   No results for input(s): LIPASE, AMYLASE in the last 168 hours. No results for input(s): AMMONIA in the last 168 hours. CBC:  Recent Labs Lab 01/25/15 1958 01/26/15 0620  WBC 6.3 4.4  NEUTROABS 3.4 2.8  HGB 10.4* 9.9*  HCT 30.8* 29.5*  MCV 75.7* 76.0*  PLT 251 234   Cardiac Enzymes:  Recent Labs Lab 01/25/15 2240 01/26/15 0620  CKTOTAL  --  66  TROPONINI 0.03 0.04*   BNP: BNP (last 3 results) No results for input(s): BNP in the last 8760 hours.  ProBNP (last 3 results) No results for input(s): PROBNP in the last 8760 hours.  CBG:  Recent Labs Lab 01/26/15 0808 01/26/15 1226 01/26/15 1631 01/27/15 0054 01/27/15 0745  GLUCAP 104* 131* 228* 101* 185*       Signed05/09/16, MD [facility] 01/27/2015, 10:02 AM

## 2015-03-09 ENCOUNTER — Other Ambulatory Visit: Payer: Self-pay | Admitting: Internal Medicine

## 2015-03-13 IMAGING — CT CT CHEST W/O CM
2 of 3 series · 15 of 36 positions shown, 18 images · non-contrast
Comparison: CT of the abdomen and pelvis on 05/30/2014

CLINICAL DATA: Fall yesterday with back pain, dizziness and
hypotension. Allergy to iodinated contrast.

EXAM:
CT CHEST WITHOUT CONTRAST
TECHNIQUE: Multidetector CT imaging of the chest was performed following the
standard protocol without IV contrast..

[Series 2: chest 5.0 b31f · axial · 0.59mm/px · z∈[+548,+774]mm · 12 of 53 slices shown, 15 images]
[im 4/53  mediastinal]
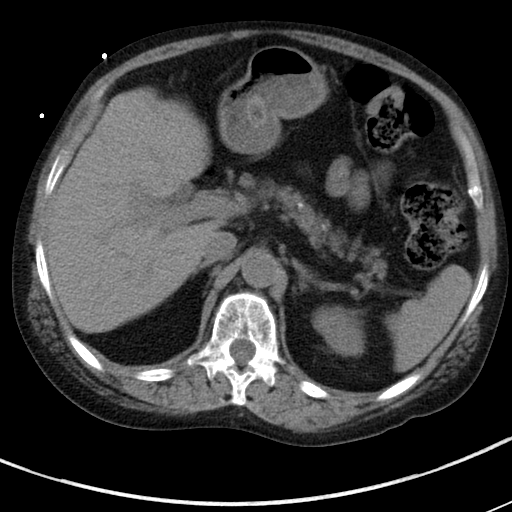
[im 4/53  lung]
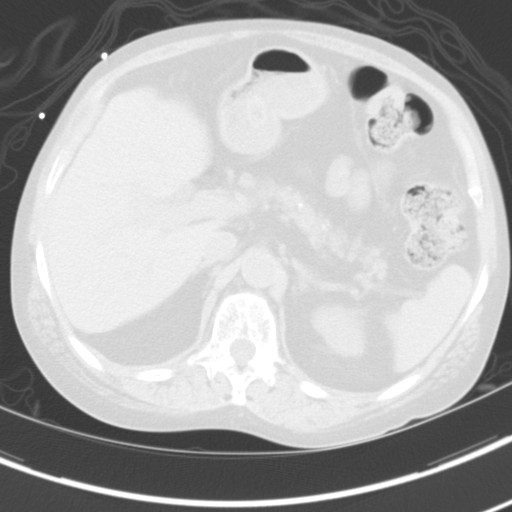
[im 8/53  lung]
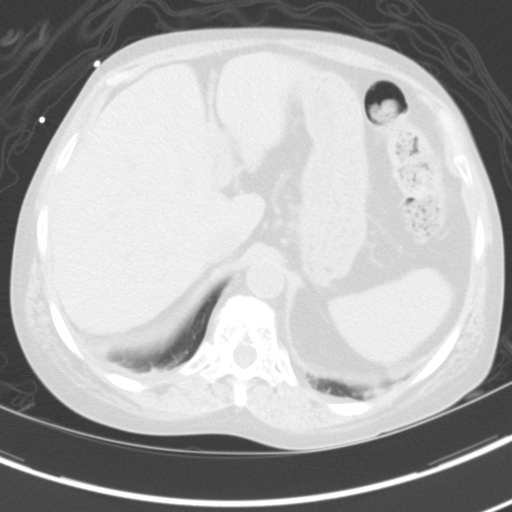
[im 12/53  lung]
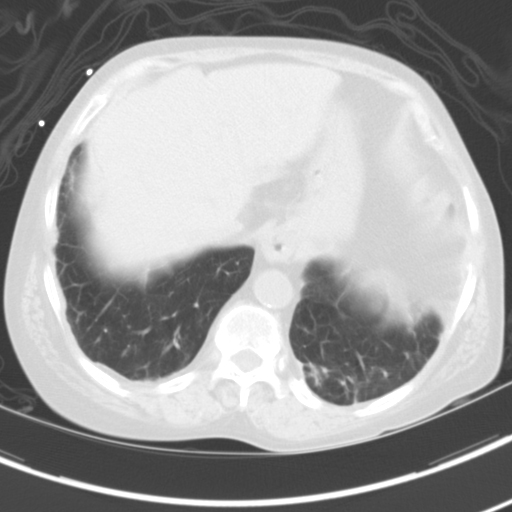
[im 16/53  lung]
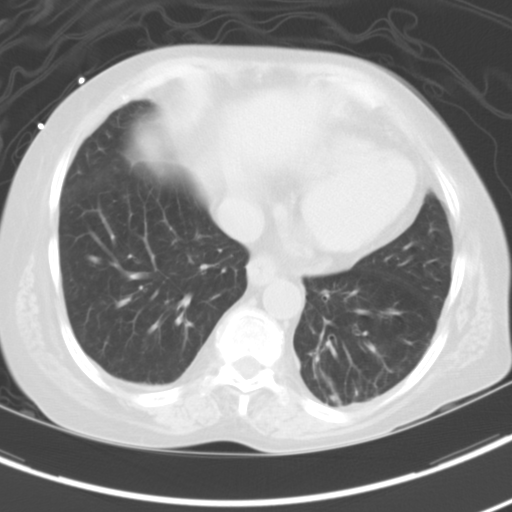
[im 20/53  mediastinal]
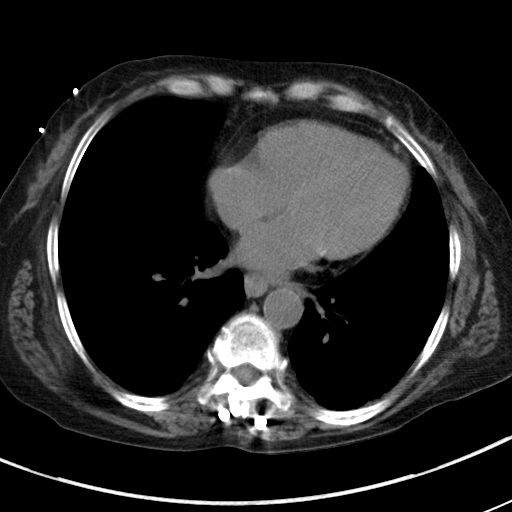
[im 20/53  lung]
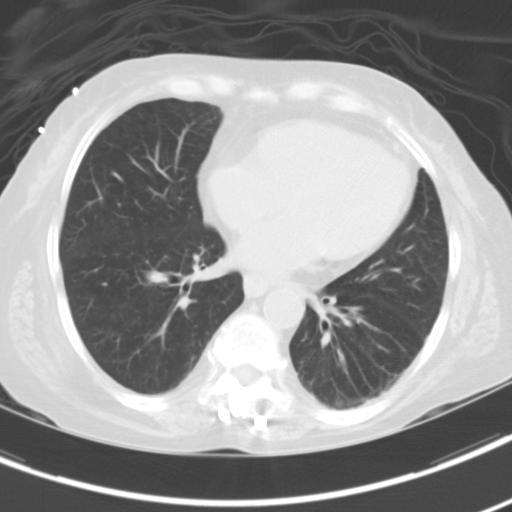
[im 24/53  lung]
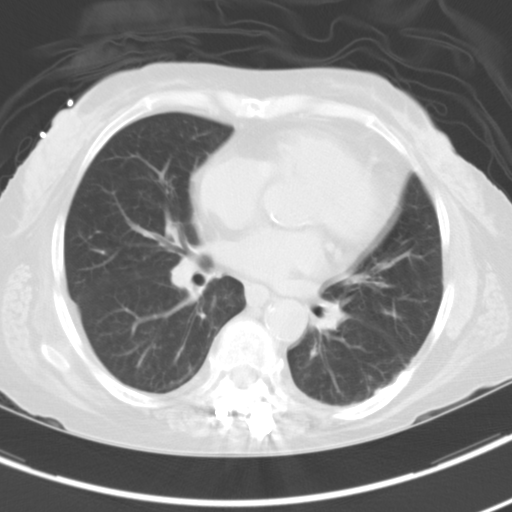
[im 29/53  lung]
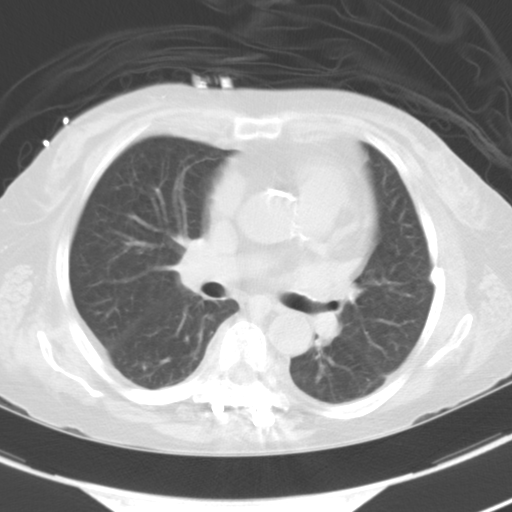
[im 33/53  lung]
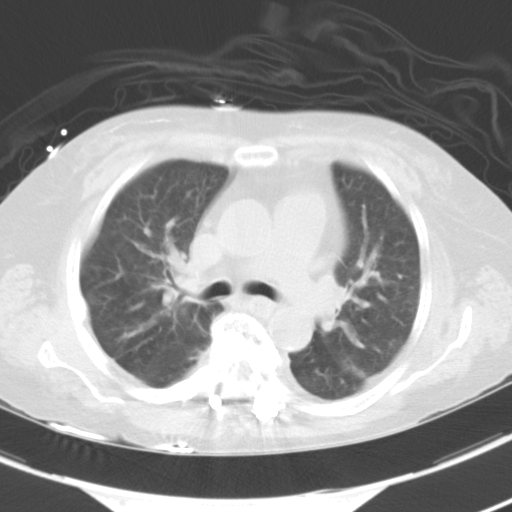
[im 37/53  mediastinal]
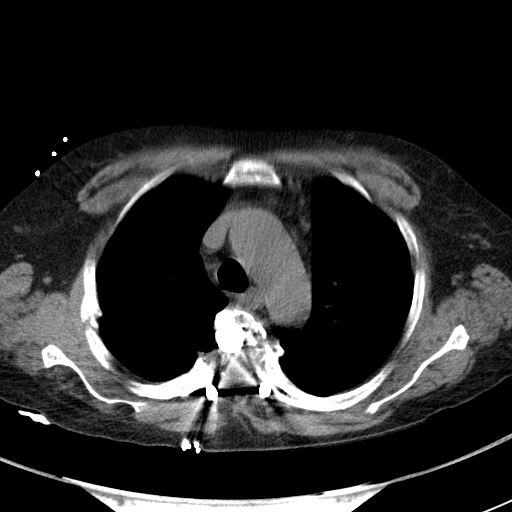
[im 37/53  lung]
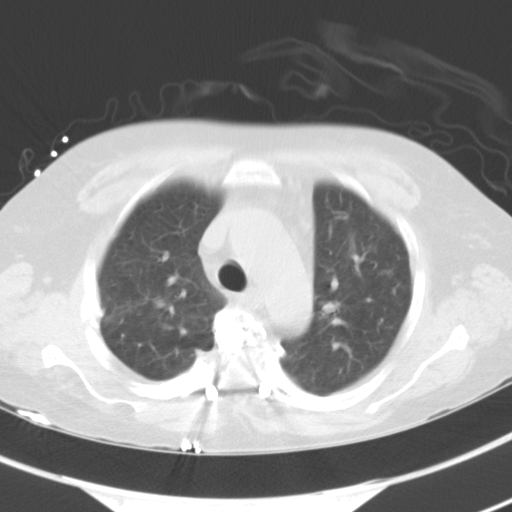
[im 41/53  lung]
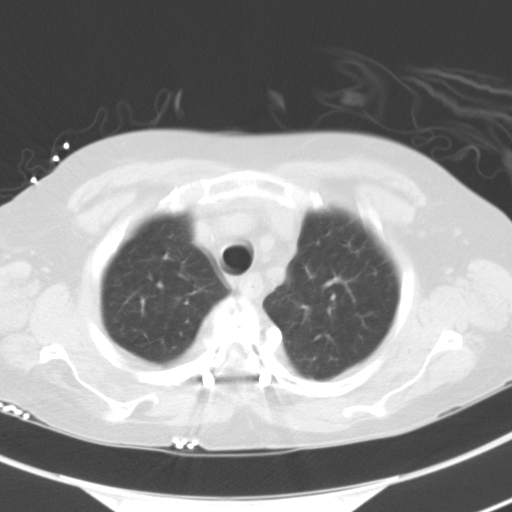
[im 45/53  lung]
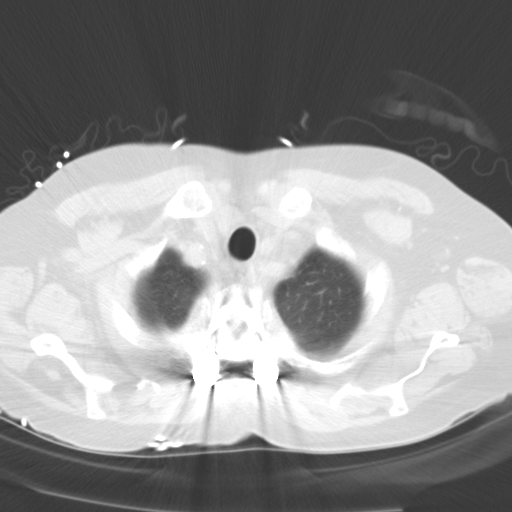
[im 49/53  lung]
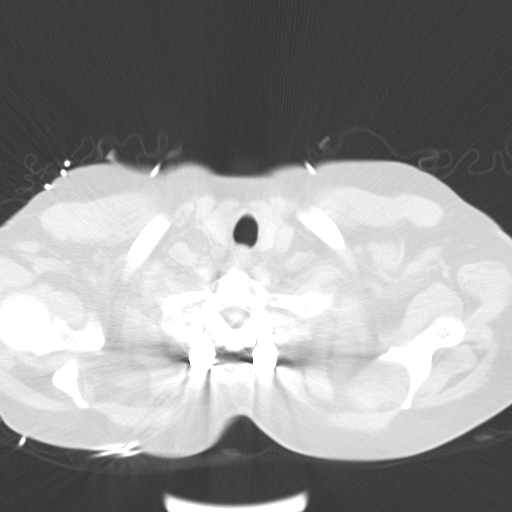

[Series 6: chest 3.0 coronal · coronal · 0.53mm/px · 3 of 81 slices shown]
[im 17/81  lung]
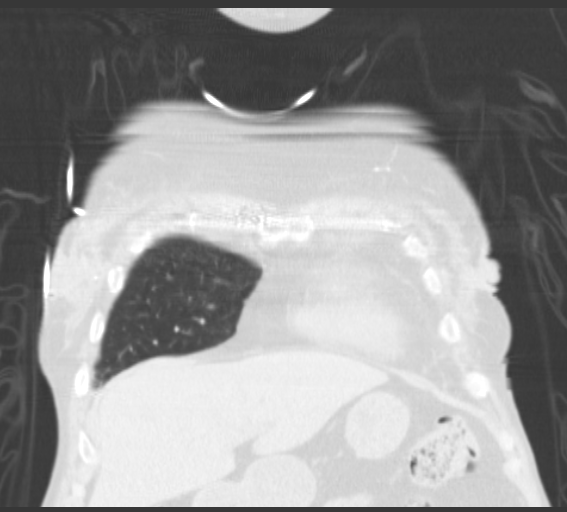
[im 33/81  lung]
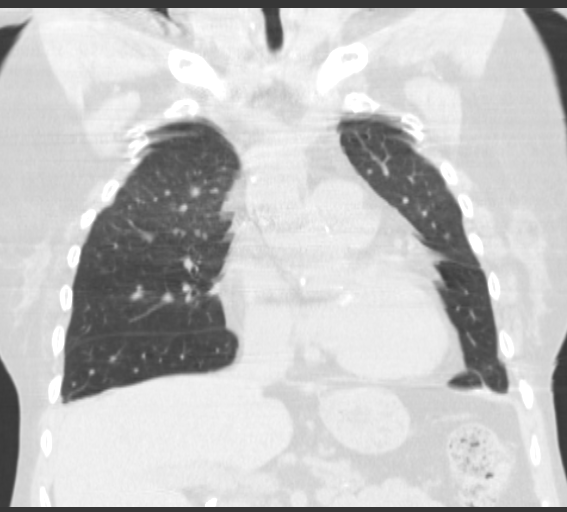
[im 49/81  lung]
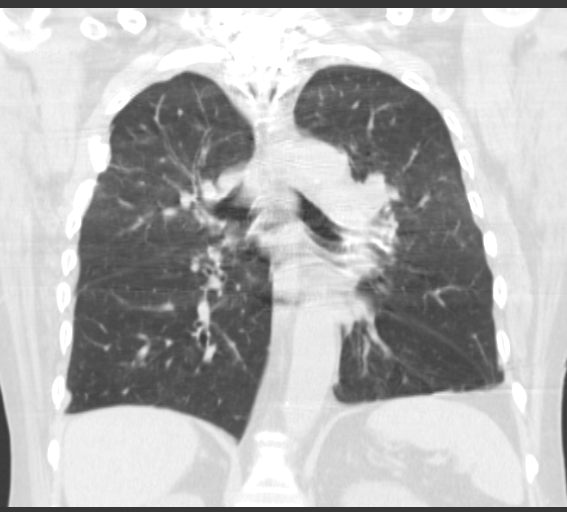

[15 of 36 positions shown; findings below may reference images not displayed]

FINDINGS: No evidence of pneumothorax, hemothorax or pulmonary consolidation.
Mild scarring/ atelectasis present at both lung bases.

No evidence of mediastinal hematoma or fluid collection. The
thoracic aorta shows atherosclerotic changes without evidence of
aneurysm or surrounding hemorrhage. No pericardial effusion.

No airway obstruction.  No masses or enlarged lymph nodes are seen.

Old fractures are seen involving the right third, fourth and fifth
ribs as well as left seventh and eighth ribs.

There is evidence of prior extensive thoracic spine fusion and
corpectomy across the T4, T5 and potentially T6 levels. Alignment of
the spine appears normal. There is mild compression of the T12
vertebral body with roughly 25-30% loss of vertebral body height.
This is chronic and stable when compared to the prior abdominal CT.
IMPRESSION: 1. No acute findings in the chest.
2. Old fractures of bilateral ribs, as above.
3. Extensive prior thoracic fusion and corpectomy. Old and stable
compression fracture of the T12 vertebral body.

## 2015-03-14 IMAGING — CT CT HEAD W/O CM
1 series · 16 of 27 positions shown, 20 images · non-contrast
Comparison: None.

CLINICAL DATA: 58-year-old diabetic hypertensive female with
history of seizures post fall. Episode of hypotension. Initial
encounter.

EXAM:
CT HEAD WITHOUT CONTRAST
TECHNIQUE: Contiguous axial images were obtained from the base of the skull
through the vertex without intravenous contrast.

[Series 2: head 5.0 h30s · axial · 0.42mm/px · z∈[-135,-15]mm · 16 of 27 slices shown, 20 images]
[im 2/27  brain]
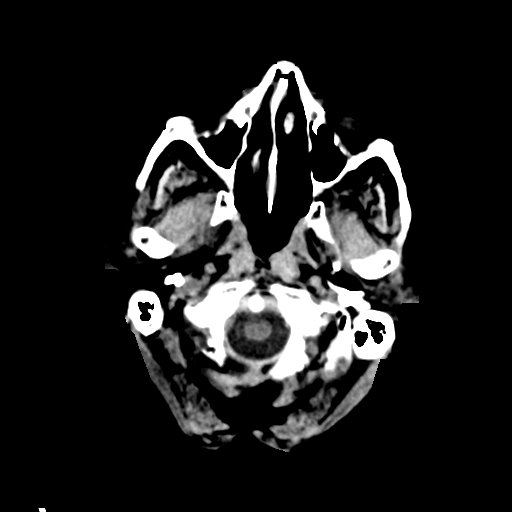
[im 2/27  bone]
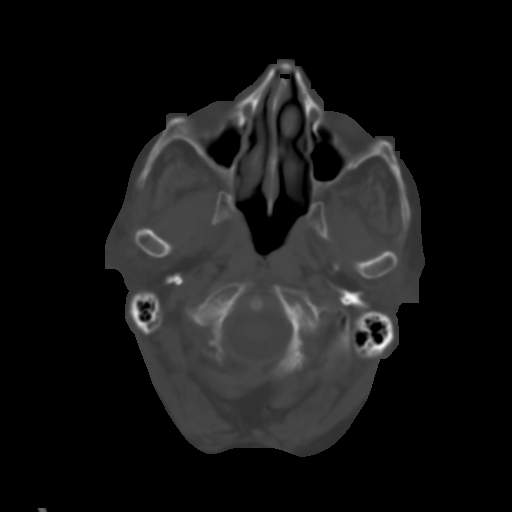
[im 4/27  brain]
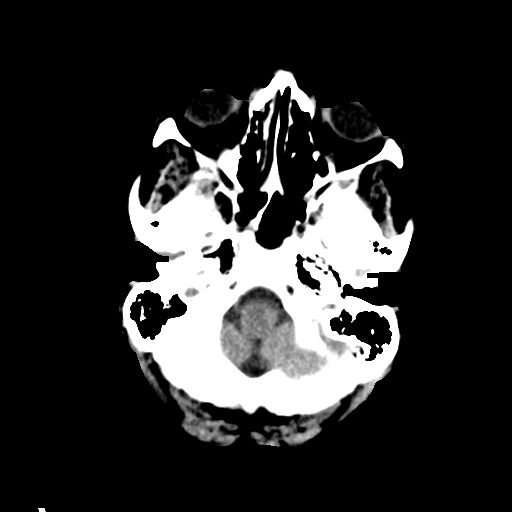
[im 5/27  brain]
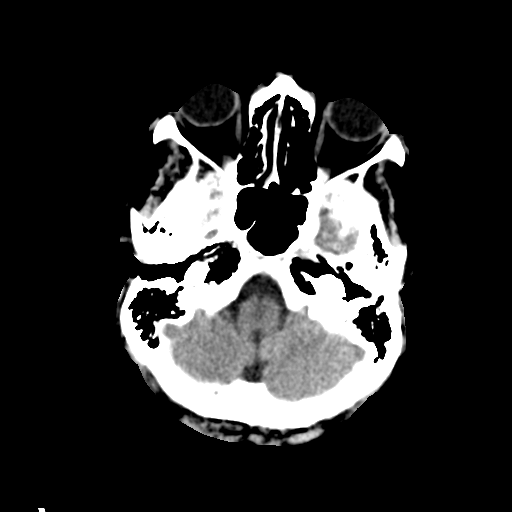
[im 7/27  brain]
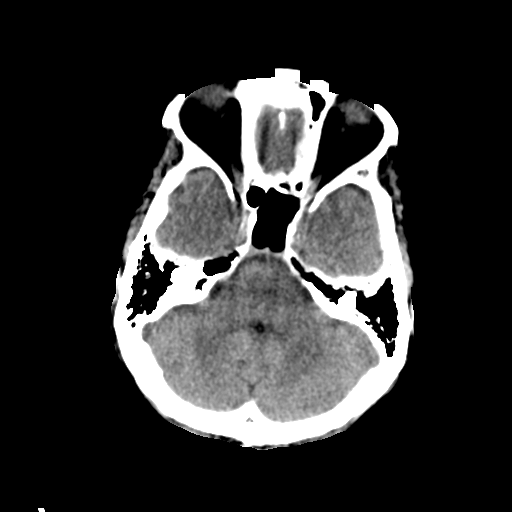
[im 9/27  brain]
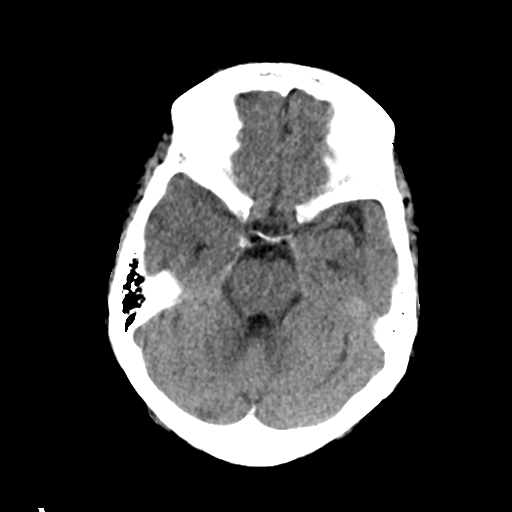
[im 9/27  bone]
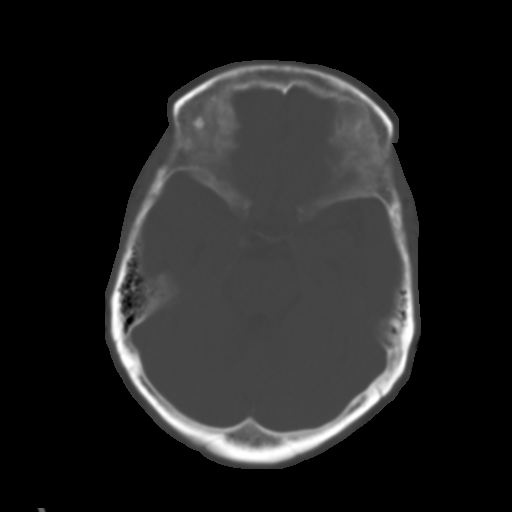
[im 10/27  brain]
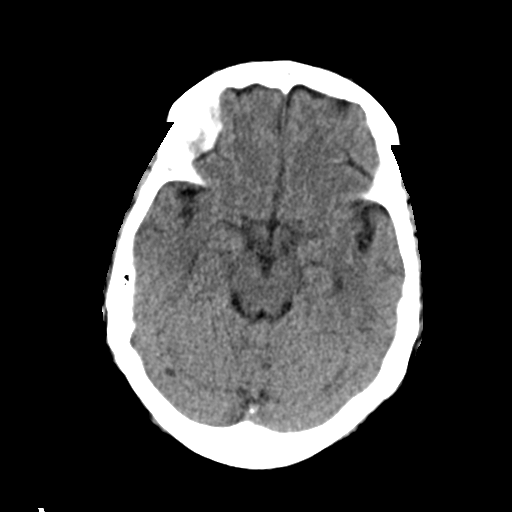
[im 12/27  brain]
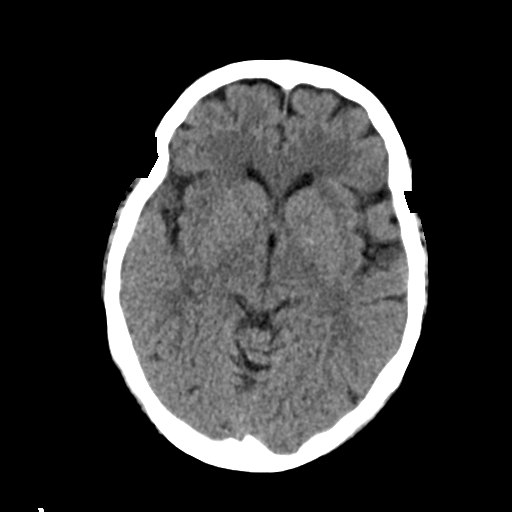
[im 13/27  brain]
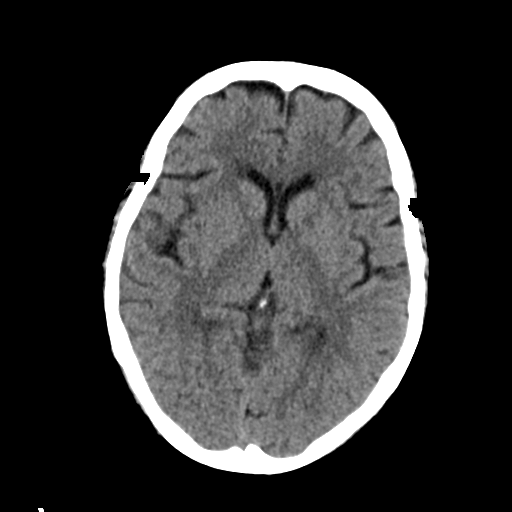
[im 15/27  brain]
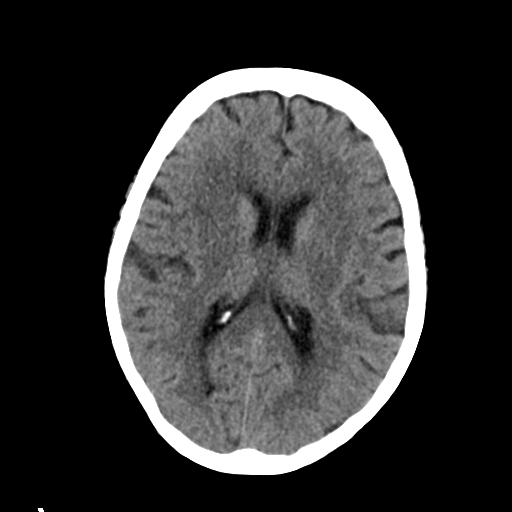
[im 15/27  bone]
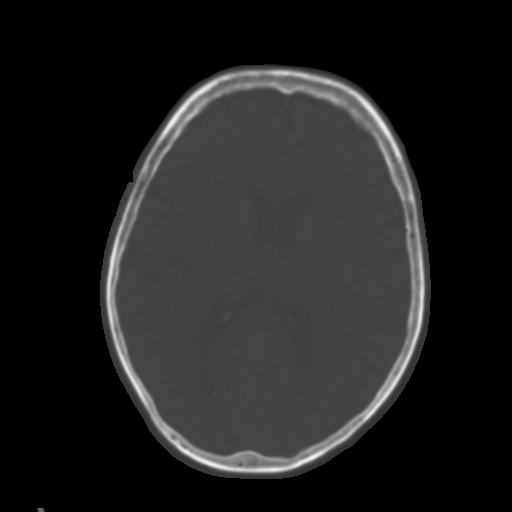
[im 16/27  brain]
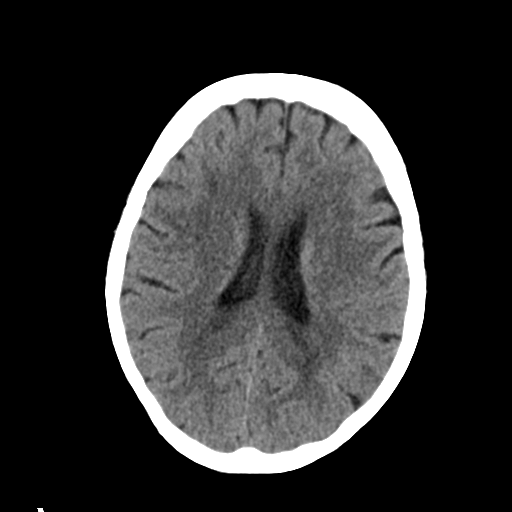
[im 18/27  brain]
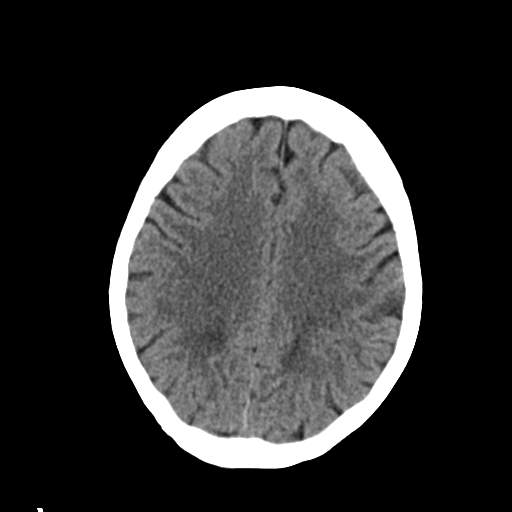
[im 19/27  brain]
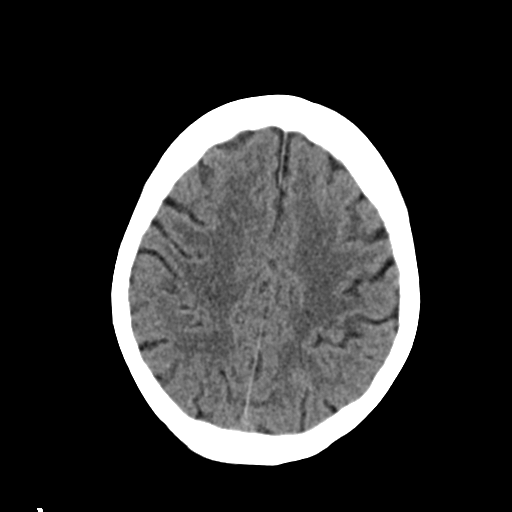
[im 21/27  brain]
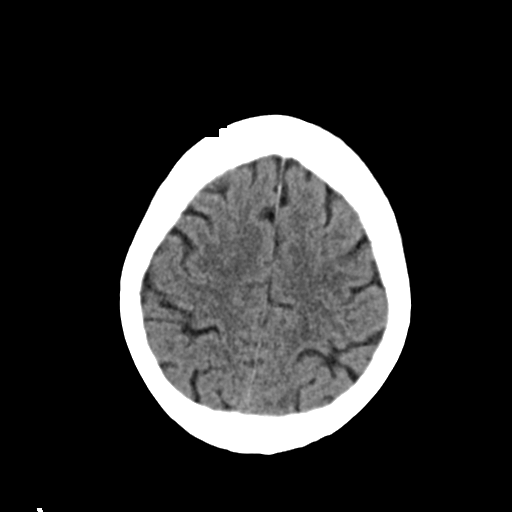
[im 21/27  bone]
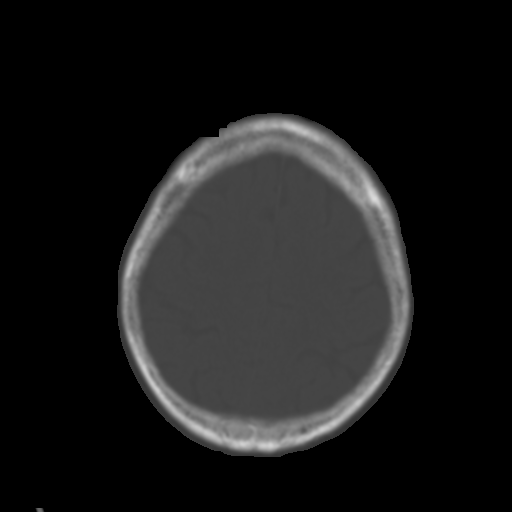
[im 23/27  brain]
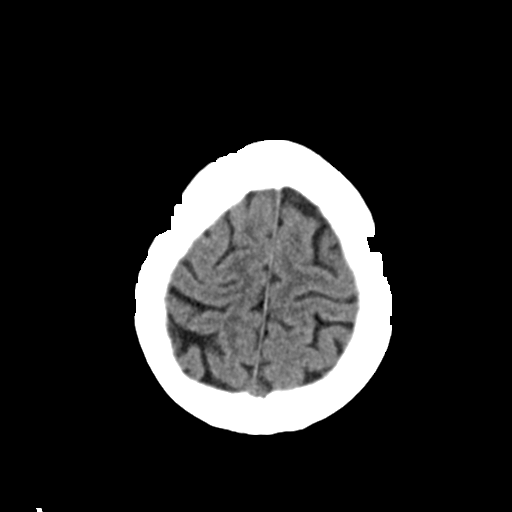
[im 24/27  brain]
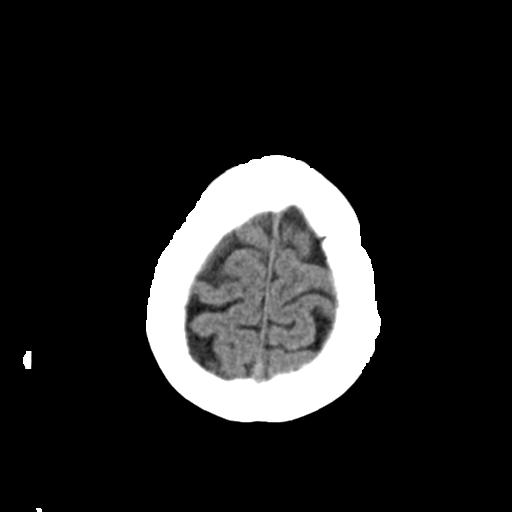
[im 26/27  brain]
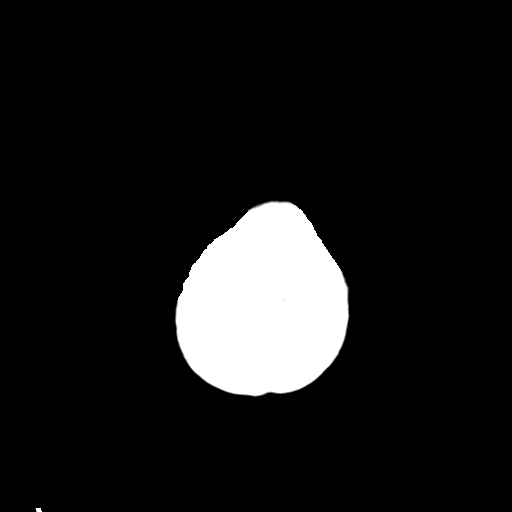

[16 of 27 positions shown; findings below may reference images not displayed]

FINDINGS: No skull fracture or intracranial hemorrhage.

Nonspecific white matter type changes most likely related to result
of small vessel disease in this diabetic hypertensive patient
without CT evidence of large acute infarct.

No hydrocephalus.

No intracranial mass lesion noted on this unenhanced exam.

Minimal mucosal thickening right maxillary sinus.

Exophthalmos.
IMPRESSION: No skull fracture or intracranial hemorrhage.

Small vessel disease type changes without CT evidence of large acute
infarct.

## 2015-04-07 ENCOUNTER — Emergency Department (HOSPITAL_BASED_OUTPATIENT_CLINIC_OR_DEPARTMENT_OTHER)
Admission: EM | Admit: 2015-04-07 | Discharge: 2015-04-07 | Disposition: A | Discharge status: Home / Self Care | Payer: Medicaid Other | Attending: Emergency Medicine | Admitting: Emergency Medicine

## 2015-04-07 ENCOUNTER — Emergency Department (HOSPITAL_BASED_OUTPATIENT_CLINIC_OR_DEPARTMENT_OTHER): Payer: Medicaid Other

## 2015-04-07 ENCOUNTER — Encounter (HOSPITAL_BASED_OUTPATIENT_CLINIC_OR_DEPARTMENT_OTHER): Payer: Self-pay | Admitting: *Deleted

## 2015-04-07 DIAGNOSIS — K219 Gastro-esophageal reflux disease without esophagitis: Secondary | ICD-10-CM | POA: Diagnosis not present

## 2015-04-07 DIAGNOSIS — Y998 Other external cause status: Secondary | ICD-10-CM | POA: Insufficient documentation

## 2015-04-07 DIAGNOSIS — Z7952 Long term (current) use of systemic steroids: Secondary | ICD-10-CM | POA: Insufficient documentation

## 2015-04-07 DIAGNOSIS — Y9389 Activity, other specified: Secondary | ICD-10-CM | POA: Insufficient documentation

## 2015-04-07 DIAGNOSIS — Z87891 Personal history of nicotine dependence: Secondary | ICD-10-CM | POA: Diagnosis not present

## 2015-04-07 DIAGNOSIS — G8929 Other chronic pain: Secondary | ICD-10-CM | POA: Insufficient documentation

## 2015-04-07 DIAGNOSIS — G40909 Epilepsy, unspecified, not intractable, without status epilepticus: Secondary | ICD-10-CM | POA: Diagnosis not present

## 2015-04-07 DIAGNOSIS — W1839XA Other fall on same level, initial encounter: Secondary | ICD-10-CM | POA: Diagnosis not present

## 2015-04-07 DIAGNOSIS — J45909 Unspecified asthma, uncomplicated: Secondary | ICD-10-CM | POA: Diagnosis not present

## 2015-04-07 DIAGNOSIS — Z792 Long term (current) use of antibiotics: Secondary | ICD-10-CM | POA: Diagnosis not present

## 2015-04-07 DIAGNOSIS — Z79899 Other long term (current) drug therapy: Secondary | ICD-10-CM | POA: Diagnosis not present

## 2015-04-07 DIAGNOSIS — Z7951 Long term (current) use of inhaled steroids: Secondary | ICD-10-CM | POA: Diagnosis not present

## 2015-04-07 DIAGNOSIS — W19XXXA Unspecified fall, initial encounter: Secondary | ICD-10-CM

## 2015-04-07 DIAGNOSIS — Y9289 Other specified places as the place of occurrence of the external cause: Secondary | ICD-10-CM | POA: Diagnosis not present

## 2015-04-07 DIAGNOSIS — E119 Type 2 diabetes mellitus without complications: Secondary | ICD-10-CM | POA: Insufficient documentation

## 2015-04-07 DIAGNOSIS — I10 Essential (primary) hypertension: Secondary | ICD-10-CM | POA: Diagnosis not present

## 2015-04-07 DIAGNOSIS — S7001XA Contusion of right hip, initial encounter: Secondary | ICD-10-CM

## 2015-04-07 DIAGNOSIS — S79911A Unspecified injury of right hip, initial encounter: Secondary | ICD-10-CM | POA: Diagnosis present

## 2015-04-07 MED ORDER — HYDROCODONE-ACETAMINOPHEN 5-325 MG PO TABS
2.0000 | ORAL_TABLET | Freq: Once | ORAL | Status: AC
  Administered 2015-04-07: 2 via ORAL
  Filled 2015-04-07: qty 2

## 2015-04-07 MED ORDER — OXYCODONE HCL 7.5 MG PO TABS
1.0000 | ORAL_TABLET | Freq: Four times a day (QID) | ORAL | Status: DC | PRN
Start: 2015-04-07 — End: 2015-06-12

## 2015-04-07 MED ORDER — HYDROMORPHONE HCL 1 MG/ML IJ SOLN
2.0000 mg | Freq: Once | INTRAMUSCULAR | Status: AC
Start: 2015-04-07 — End: 2015-04-07
  Administered 2015-04-07: 2 mg via INTRAMUSCULAR
  Filled 2015-04-07: qty 2

## 2015-04-07 NOTE — ED Notes (Signed)
MD at bedside. 

## 2015-04-07 NOTE — ED Provider Notes (Signed)
CSN: [id number]     Arrival date & time 04/07/15  1029 History   First MD Initiated Contact with Patient 04/07/15 1148     Chief Complaint  Patient presents with  . 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 She presents after falling 3 days ago and for evaluation of pain in her right hip. It is worse when she ambulates and bears weight.  Patient is a 59 y.o. female presenting with fall. The history is provided by the patient.  Fall This is a new problem. Episode onset: 3 days ago. The problem occurs constantly. The problem has been gradually worsening. The symptoms are aggravated by walking. Nothing relieves the symptoms. She has tried nothing for the symptoms. The treatment provided no relief.    Past Medical History  Diagnosis Date  . Arthritis   . Asthma   . Hypertension   . Constipation   . GERD (gastroesophageal reflux disease)   . Diabetes mellitus without complication   . Chronic pain   . Seizures     last seizure March 2015   Past Surgical History  Procedure Laterality Date  . Back surgery    . Cholecystectomy    . Appendectomy     Family History  Problem Relation Age of Onset  . Kidney failure Mother   . Hypertension Sister   . Hypertension Brother    History  Substance Use Topics  . Smoking status: Former Games developer  . Smokeless tobacco: Never Used  . Alcohol Use: Yes     Comment: admits to 2 drinks/week   OB History    No data available     Review of Systems  All other systems reviewed and are negative.     Allergies  Ivp dye  Home Medications   Prior to Admission medications   Medication Sig Start Date End Date Taking? Authorizing Provider  albuterol (PROVENTIL HFA;VENTOLIN HFA) 108 (90 BASE) MCG/ACT inhaler Inhale 2 puffs into the  lungs every 6 (six) hours as needed for wheezing or shortness of breath.   Yes Historical Provider, MD  amLODipine (NORVASC) 5 MG tablet Take 5 mg by mouth daily.   Yes Historical Provider, MD  esomeprazole (NEXIUM) 40 MG capsule Take 40 mg by mouth daily at 12 noon.   Yes Historical Provider, MD  Fluticasone-Salmeterol (ADVAIR) 250-50 MCG/DOSE AEPB Inhale 1 puff into the lungs 2 (two) times daily.   Yes Historical Provider, MD  folic acid (FOLVITE) 1 MG tablet Take 1 mg by mouth daily.   Yes Historical Provider, MD  lipase/protease/amylase (CREON) 12000 UNITS CPEP capsule Take by mouth.   Yes Historical Provider, MD  metoprolol succinate (TOPROL-XL) 25 MG 24 hr tablet Take 25 mg by mouth daily.   Yes Historical Provider, MD  Oxycodone HCl 20 MG TABS Take 20 mg by mouth 4 (four) times daily as needed.   Yes Historical Provider, MD  oxymorphone (OPANA ER) 40 MG 12 hr tablet Take 40 mg by mouth every 12 (twelve) hours.   Yes Historical Provider, MD  potassium chloride SA (K-DUR,KLOR-CON) 20 MEQ tablet Take 2 tablets (40 mEq total) by mouth daily. 01/27/15  Yes Calvert Cantor, MD  predniSONE (DELTASONE) 5 MG tablet Take 3 tablets (15 mg total) by mouth daily with breakfast. 01/27/15  Yes Calvert Cantor, MD  tiZANidine (ZANAFLEX) 4 MG tablet  Take 0.5 tablets (2 mg total) by mouth 2 (two) times daily as needed for muscle spasms. 01/27/15  Yes Calvert Cantor, MD  triamterene-hydrochlorothiazide (MAXZIDE-25) 37.5-25 MG per tablet Take 1 tablet by mouth daily.   Yes Historical Provider, MD  Vitamin D, Ergocalciferol, (DRISDOL) 50000 UNITS CAPS capsule Take 1 capsule (50,000 Units total) by mouth every 7 (seven) days. Sunday 01/27/15  Yes Calvert Cantor, MD  VITAMIN E PO Take 1 capsule by mouth daily.   Yes Historical Provider, MD  dicloxacillin (DYNAPEN) 500 MG capsule Take 1 capsule (500 mg total) by mouth 3 (three) times daily. 01/27/15   Calvert Cantor, MD  levETIRAcetam (KEPPRA) 500 MG tablet Take 1 tablet (500 mg total) by  mouth 2 (two) times daily. 01/27/15   Calvert Cantor, MD   BP 119/62 mmHg  Pulse 88  Temp(Src) 98.2 F (36.8 C) (Oral)  Resp 20  Ht 5' (1.524 m)  Wt 125 lb (56.7 kg)  BMI 24.41 kg/m2  SpO2 98% Physical Exam  Constitutional: She is oriented to person, place, and time. She appears well-developed and well-nourished. No distress.  HENT:  Head: Normocephalic and atraumatic.  Neck: Normal range of motion. Neck supple.  Musculoskeletal:  The right hip appears grossly normal. There is tenderness to palpation over the lateral aspect. There is pain with range of motion. There is no shortening or rotation of the extremity. DP pulses are palpable bilaterally. Motor and sensation are intact to the entire foot.  Neurological: She is alert and oriented to person, place, and time.  Skin: Skin is warm and dry. She is not diaphoretic.  Nursing note and vitals reviewed.   ED Course  Procedures (including critical care time) Labs Review Labs Reviewed - No data to display  Imaging Review No results found.   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 She presents today after a fall complaining of pain in her right hip. X-rays are negative, however she continued to complain of severe pain. For this reason and her history of prior pelvic fracture, a CT scan was obtained. This was negative for acute fracture. She has informed me she is out of her pain medication and is requesting that I prescribe something for her pain. She has an appointment in 2 days with her primary Dr. and I have agreed to write for a very limited quantity of these. She needs to follow-up with her primary Dr. for further prescriptions.    Geoffery Lyons, MD 04/07/15 [phone]

## 2015-04-07 NOTE — ED Notes (Signed)
Patient transported to CT 

## 2015-04-07 NOTE — Discharge Instructions (Signed)
Oxycodone as prescribed as needed for pain.  Please follow-up with your primary Dr. or pain management specialist for refills of your pain medications.   Iliac Crest Contusion An iliac crest contusion is a deep bruise of your hip bone (hip pointer). Contusions are the result of an injury that caused bleeding under the skin. The contusion may turn blue, purple, or yellow. Minor injuries will give you a painless contusion, but more severe contusions may stay painful and swollen for a few weeks.  CAUSES  An iliac crest contusion is usually caused by a blow to the top of your hip pointer. The injury most often comes from contact sports.  SYMPTOMS   Swelling and redness of the hip area.  Bruising of the hip area.  Tenderness or soreness of the hip. DIAGNOSIS  The diagnosis can be made by taking your history and doing a physical exam. You may need an X-ray of your hip pointer to look for a broken bone (fracture). TREATMENT  Often, the best treatment for an iliac crest contusion is resting, icing, elevating, and applying cold compresses to the injured area. Over-the-counter medicines may also be recommended for pain control. Crutches may be used if walking is very painful. Some people need physical therapy to help with range of motion and strengthening.  HOME CARE INSTRUCTIONS   Put ice on the injured area.  Put ice in a plastic bag.  Place a towel between your skin and the bag.  Leave the ice on for 15-20 minutes, 03-04 times a day.  Only take over-the-counter or prescription medicines for pain, discomfort, or fever as directed by your caregiver. Your caregiver may recommend avoiding anti-inflammatory medicines (aspirin, ibuprofen, and naproxen) for 48 hours because these medicines may increase bruising.  Keep your leg straightened (extended) when possible.  Walk or move around as the pain allows, or as directed by your caregiver. Use crutches if your caregiver recommends them.  Apply  compression wraps as directed by your caregiver. You may remove the wraps for sleeping, showers, and baths. SEEK IMMEDIATE MEDICAL CARE IF:   You have increased bruising or swelling.  You have pain that is getting worse.  Your swelling or pain is not relieved with medicines.  Your toes get cold. MAKE SURE YOU:   Understand these instructions.  Will watch your condition.  Will get help right away if you are not doing well or get worse. Document Released: 08/01/2001 Document Revised: 01/29/2012 Document Reviewed: 09/22/2011 [facility] Patient Information 2015 [place], [place]. This information is not intended to replace advice given to you by your health care provider. Make sure you discuss any questions you have with your health care provider.

## 2015-04-07 NOTE — ED Notes (Signed)
Patient transported to X-ray 

## 2015-04-07 NOTE — ED Notes (Signed)
Patient states she has a history of paralysis of the lower extremities, is able to stand and walk with maximal assistance.  States three days she fell three times. Now complaints of right hip pain with radiation into right leg.

## 2015-05-24 IMAGING — CR DG HIP (WITH OR WITHOUT PELVIS) 2-3V*R*
3 series · 3 of 3 positions shown · non-contrast
Comparison: None.

CLINICAL DATA: Fall on right hip last [REDACTED] with impaired
ambulation. Right hip pain radiating down right leg. Initial
encounter.

EXAM:
RIGHT HIP (WITH PELVIS) 2-3 VIEWS

[t pelvis a.p.]
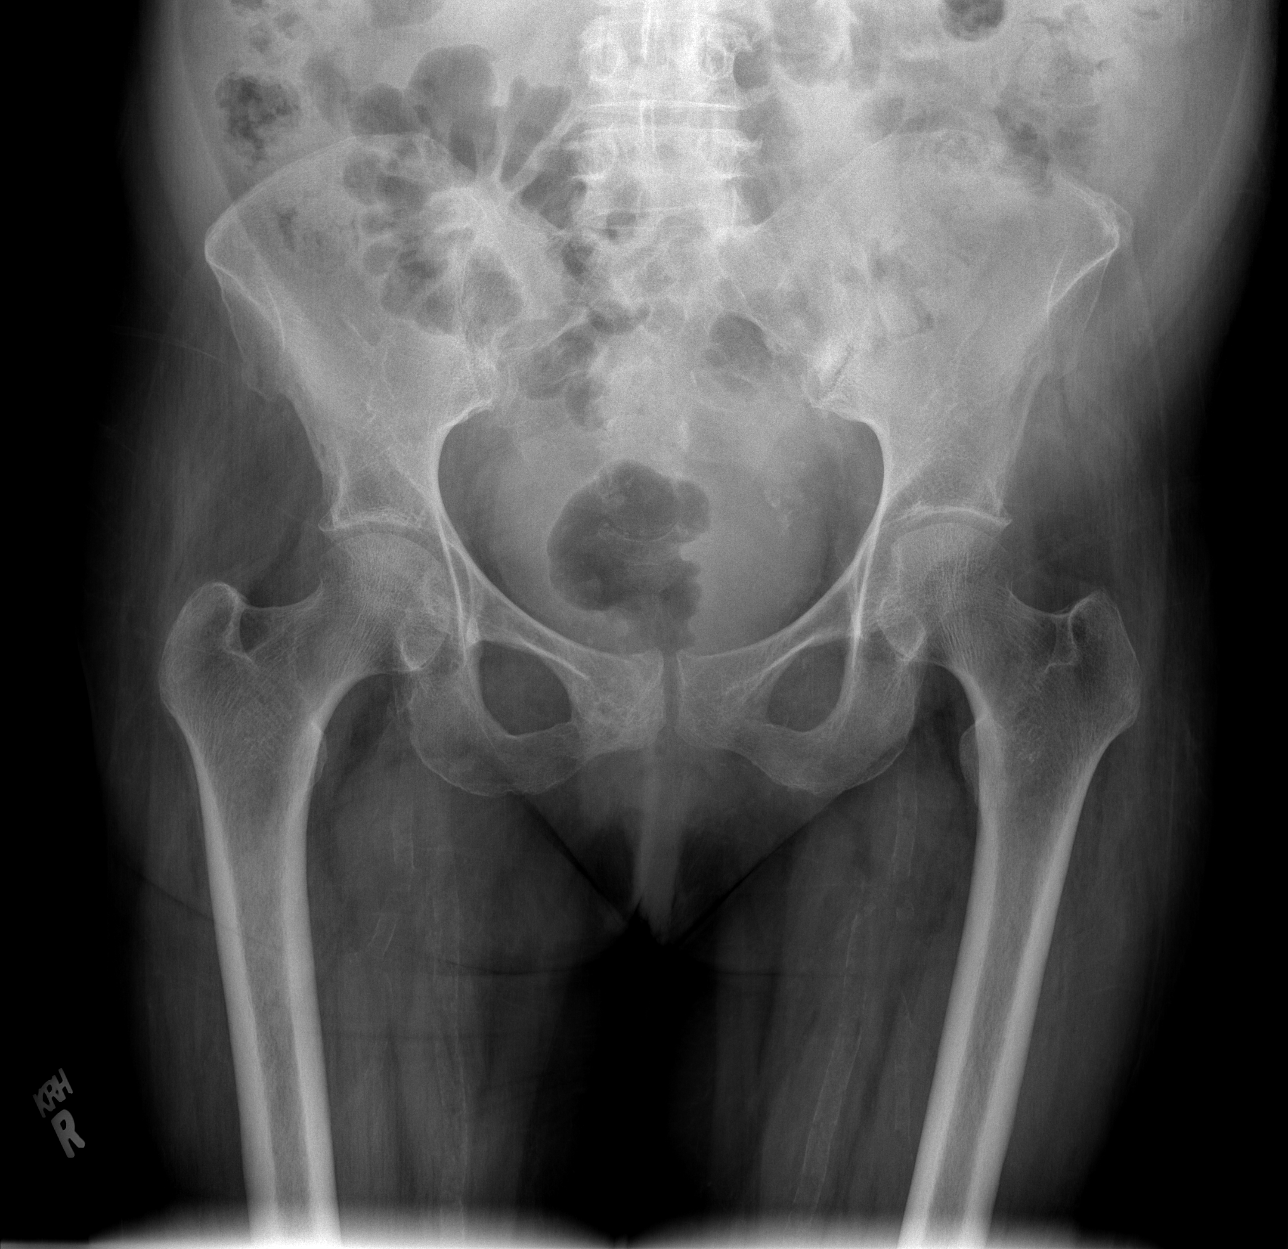

[t hip ap right]
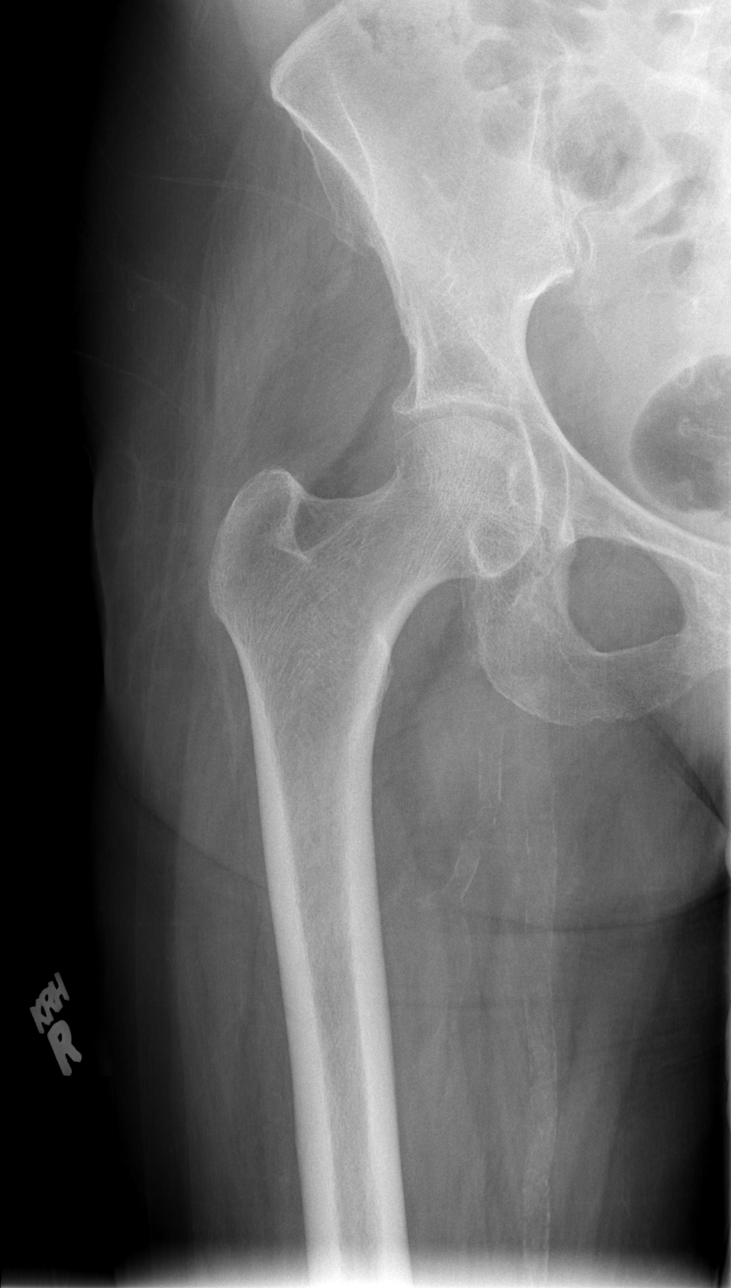

[t hip frog leg right]
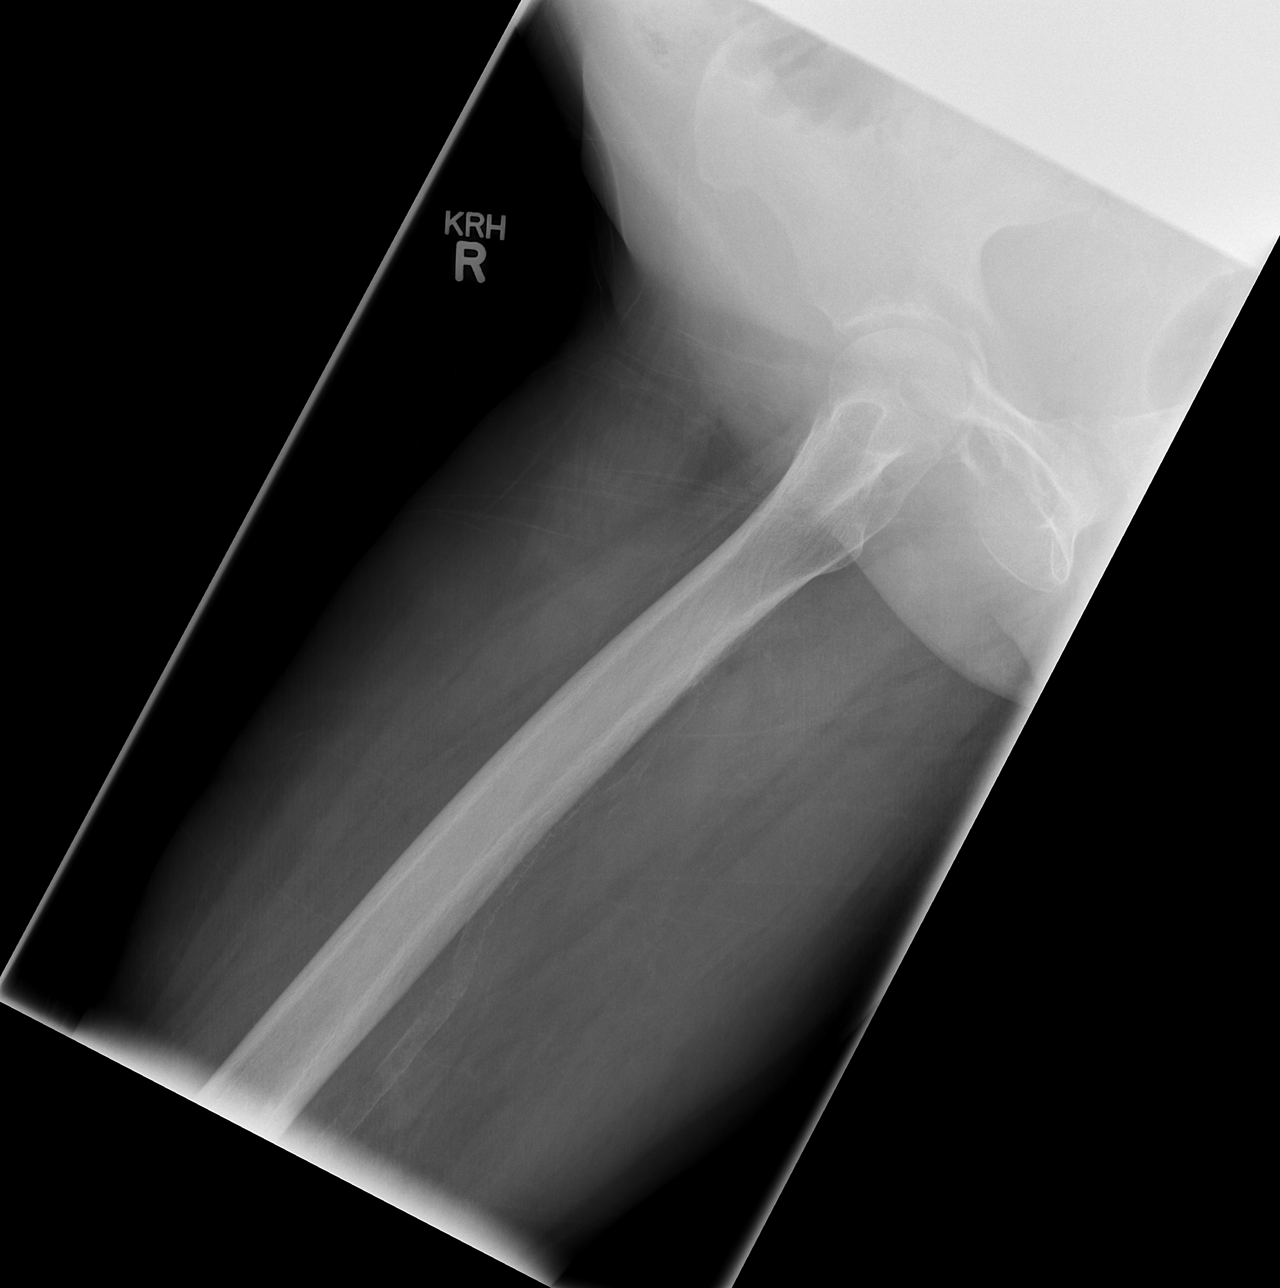

[3 of 3 positions shown; findings below may reference images not displayed]

FINDINGS: Proximal right femur is intact. No hip dislocation. There may be
slight irregularity of the right sacral ala. Question remote right
pubic bone fracture with sclerosis.
IMPRESSION: 1. No hip dislocation or proximal right femur fracture.
2. Difficult to exclude a nondisplaced right sacral ala fracture.

## 2015-05-24 IMAGING — CT CT PELVIS W/O CM
2 of 3 series · 15 of 46 positions shown, 17 images · non-contrast
Comparison: X-ray of the right hip same day and CT scan 05/30/2014

CLINICAL DATA: Recent fall on concrete floor, right hip pain,
possible right sacral ala fracture

EXAM:
CT PELVIS WITHOUT CONTRAST
TECHNIQUE: Multidetector CT imaging of the pelvis was performed following the
standard protocol without intravenous contrast.

[Series 2: pelvis 5.0 b31f · axial · 0.72mm/px · z∈[-228,-18]mm · 12 of 50 slices shown, 14 images]
[im 4/50  soft-tissue]
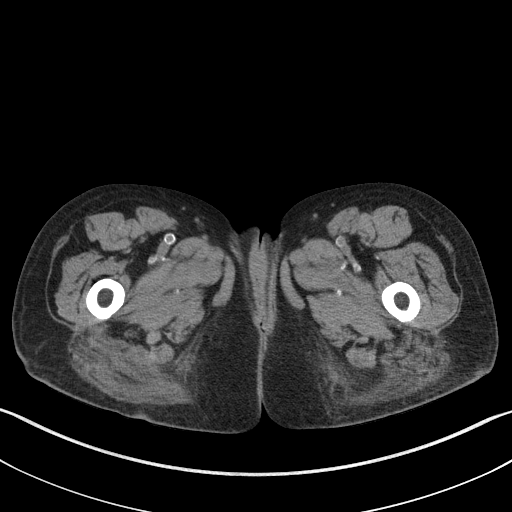
[im 4/50  bone]
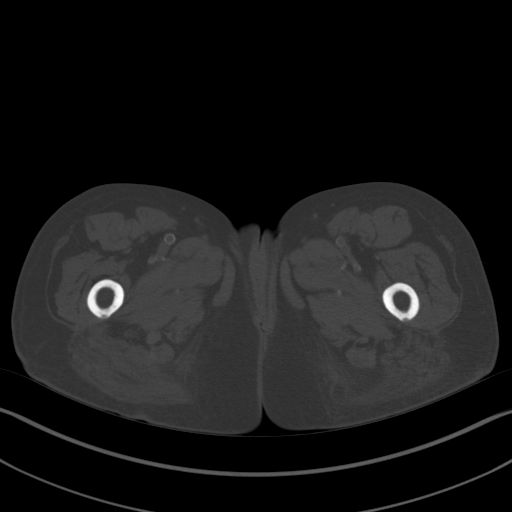
[im 7/50  soft-tissue]
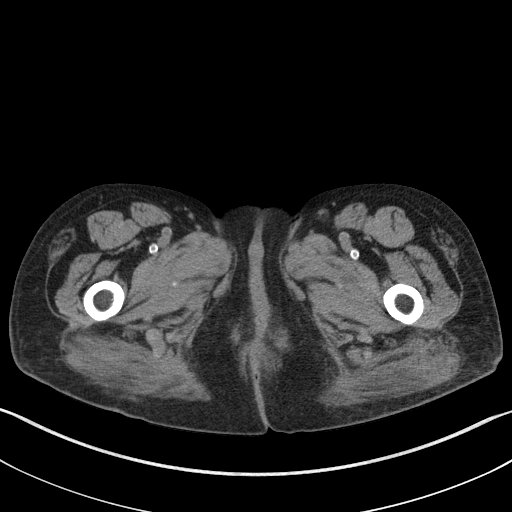
[im 12/50  soft-tissue]
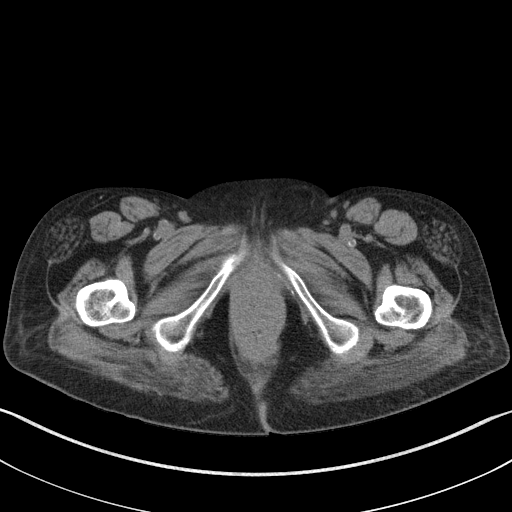
[im 15/50  soft-tissue]
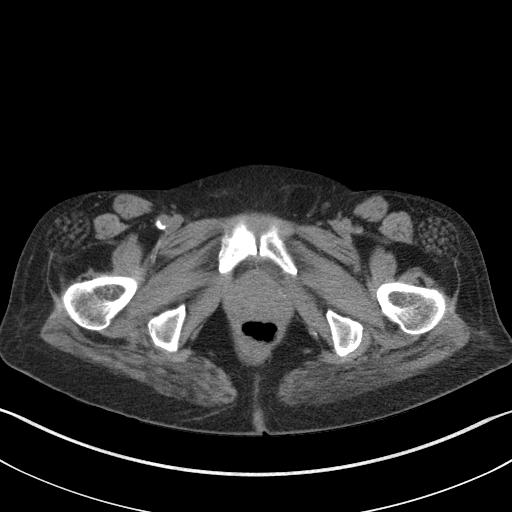
[im 19/50  soft-tissue]
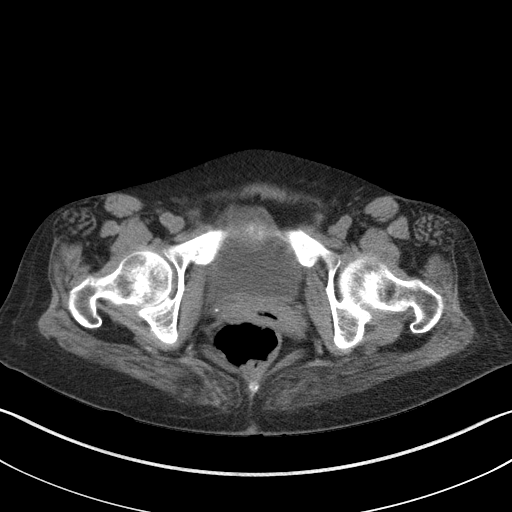
[im 23/50  soft-tissue]
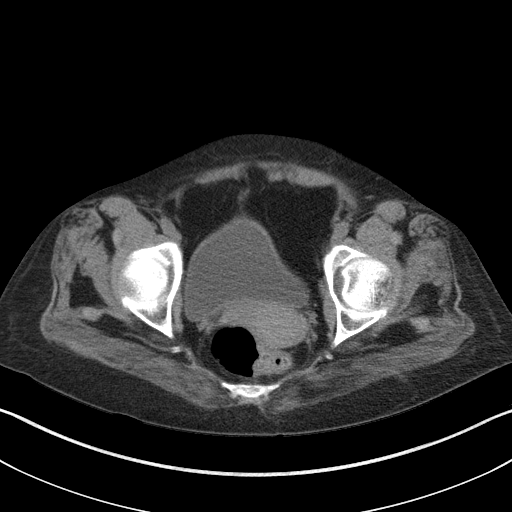
[im 27/50  soft-tissue]
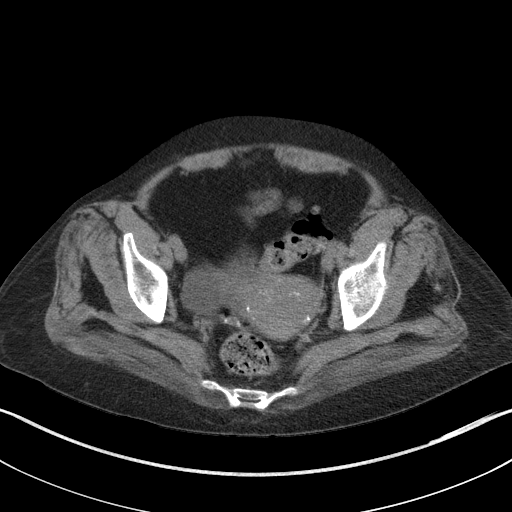
[im 31/50  soft-tissue]
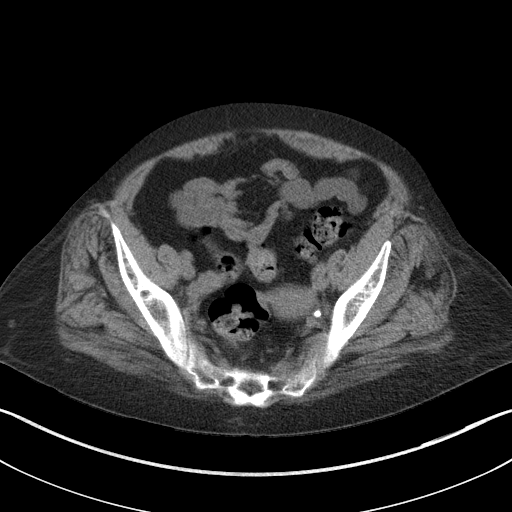
[im 35/50  soft-tissue]
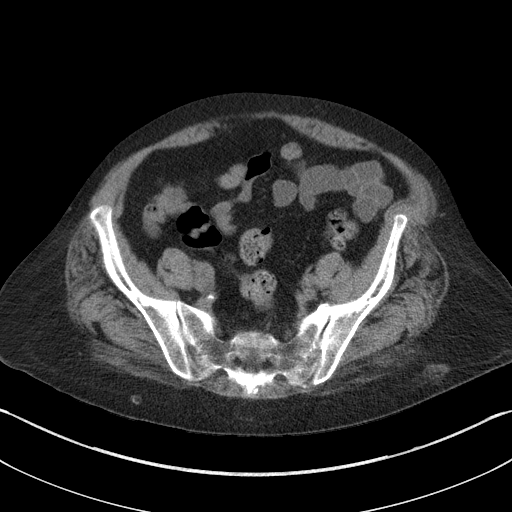
[im 35/50  bone]
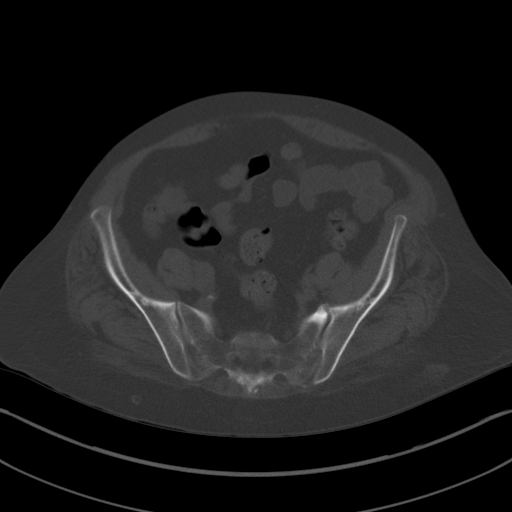
[im 38/50  soft-tissue]
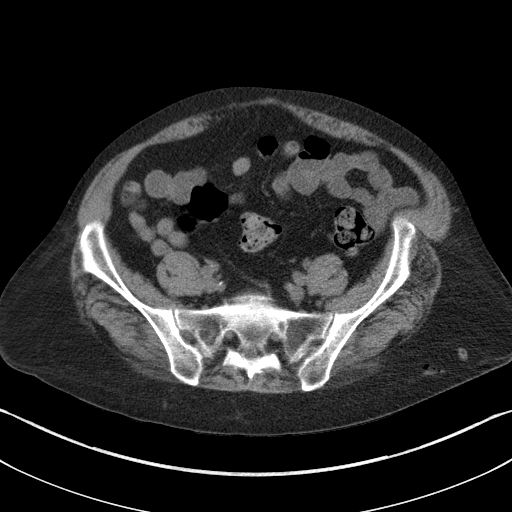
[im 43/50  soft-tissue]
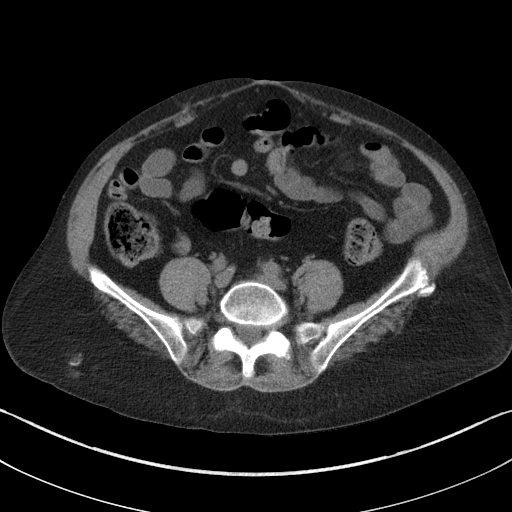
[im 46/50  soft-tissue]
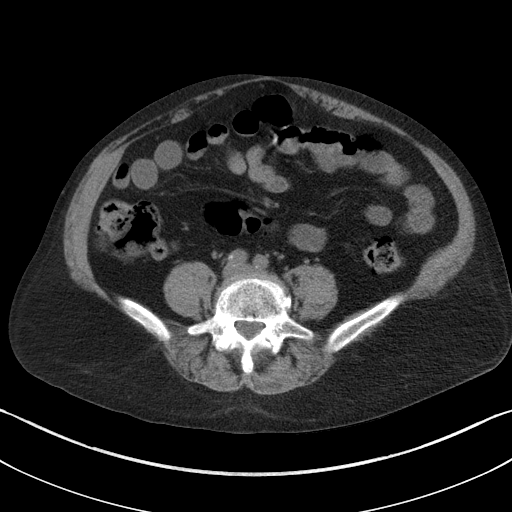

[Series 3: pelvis 2.0 coronal · coronal · 0.50mm/px · 3 of 130 slices shown]
[im 44/130  soft-tissue]
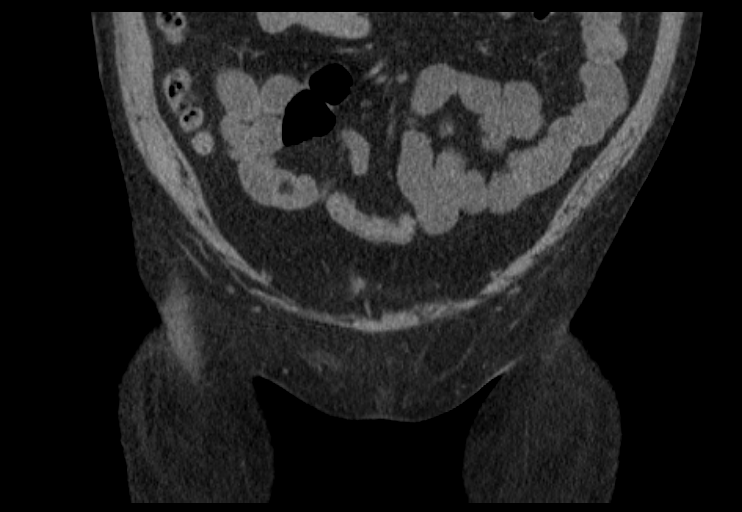
[im 58/130  soft-tissue]
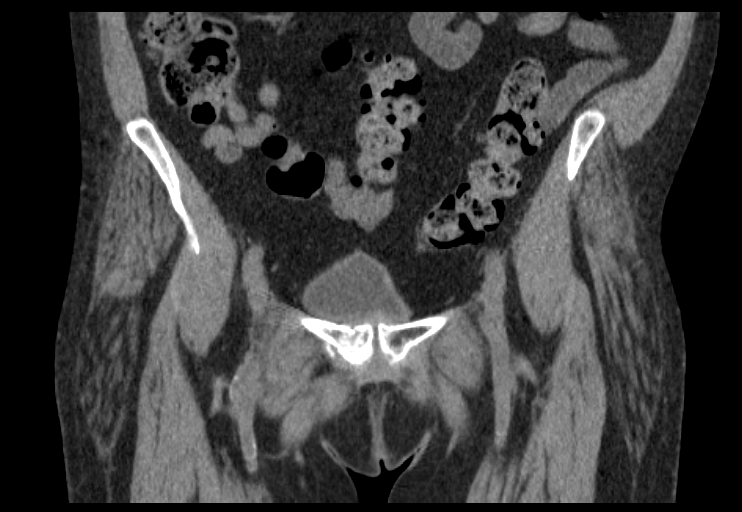
[im 72/130  soft-tissue]
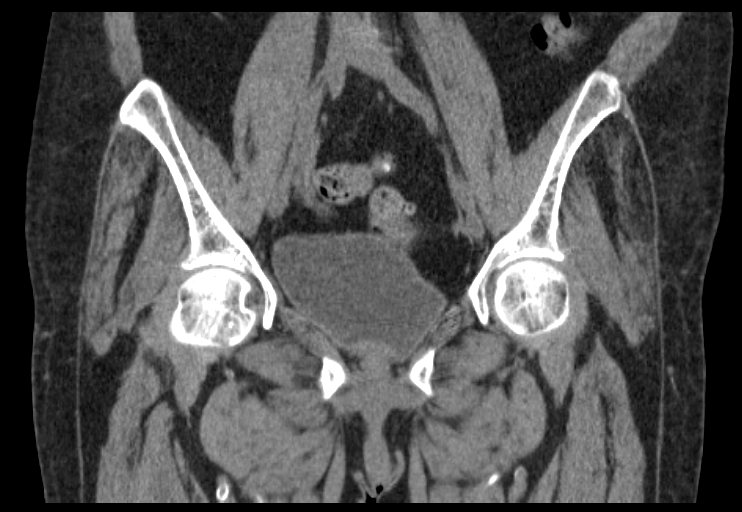

[15 of 46 positions shown; findings below may reference images not displayed]

FINDINGS: Axial images shows no sacral fracture. There is no pelvic hematoma
or adenopathy. Degenerative changes are noted bilateral SI joints
left greater than right.

There is no hip fracture noted. Bilateral hip joints are symmetrical
in appearance. Coronal images shows minimal narrowing of superior
hip joint space bilaterally. Again there is no evidence acute
fracture or subluxation. No avascular necrosis of femoral head.
There is sclerosis with a vague lucent line in superior cortex right
superior pubic ramus adjacent to the pubic symphysis. This is best
visualized in coronal image 62. There is callus formation. This is
consistent with interval healing fracture of right superior pubic
ramus adjacent to pubic symphysis of indeterminate age. Clinical
correlation is necessary.

Atherosclerotic calcifications of abdominal aorta and iliac
arteries. Moderate stool is noted within cecum. No pericecal
inflammation. No distended small bowel loops. The uterus is
atrophic. The urinary bladder is unremarkable. Small left inguinal
canal hernia containing fat.
IMPRESSION: 1. There is sclerosis with callus formation and vague cortical
lucent line in right superior pubic ramus adjacent to pubic
symphysis. Best seen in axial image 33 and coronal image 62. This is
consistent with interval healing fracture with callus formation of
right superior pubic ramus of indeterminate age. There is no
definite evidence of hyper acute fracture.
2. No hip fracture is noted. No sacral fracture. Minimal
degenerative changes bilateral hip joints.
3. No evidence of urinary bladder injury. Unremarkable uterus. No
pericecal inflammation.
4. No pelvic hematoma.

## 2015-05-24 IMAGING — CR DG FEMUR 2+V*R*
2 series · 2 of 2 positions shown · non-contrast
Comparison: Hip and pelvis radiograph reported separately.

CLINICAL DATA: Patient fell onto concrete landing on RIGHT hip.
Pain.

EXAM:
RIGHT FEMUR 2 VIEWS

[t femur with knee ap right]
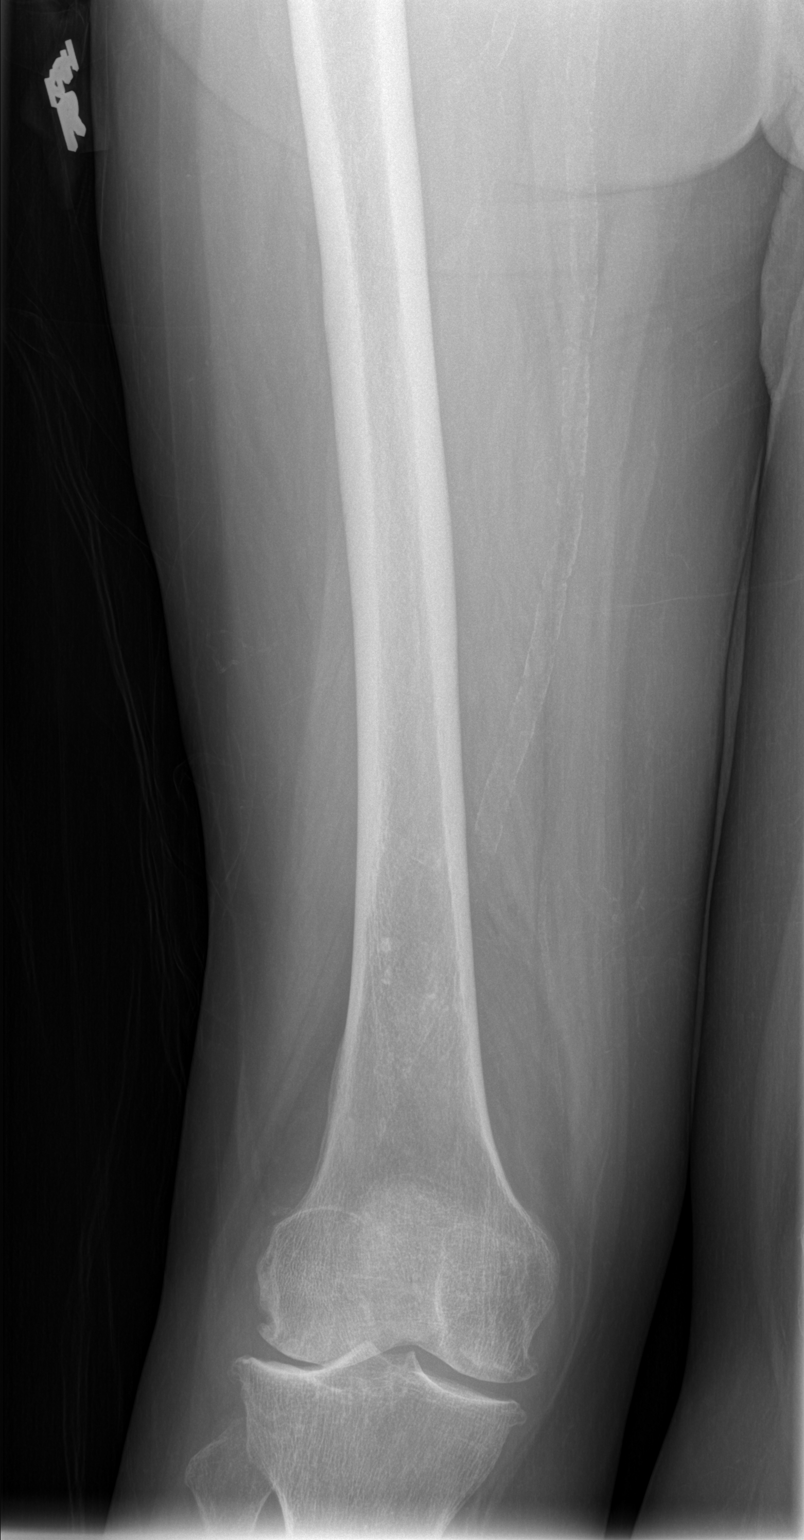

[t femur with knee lat right]
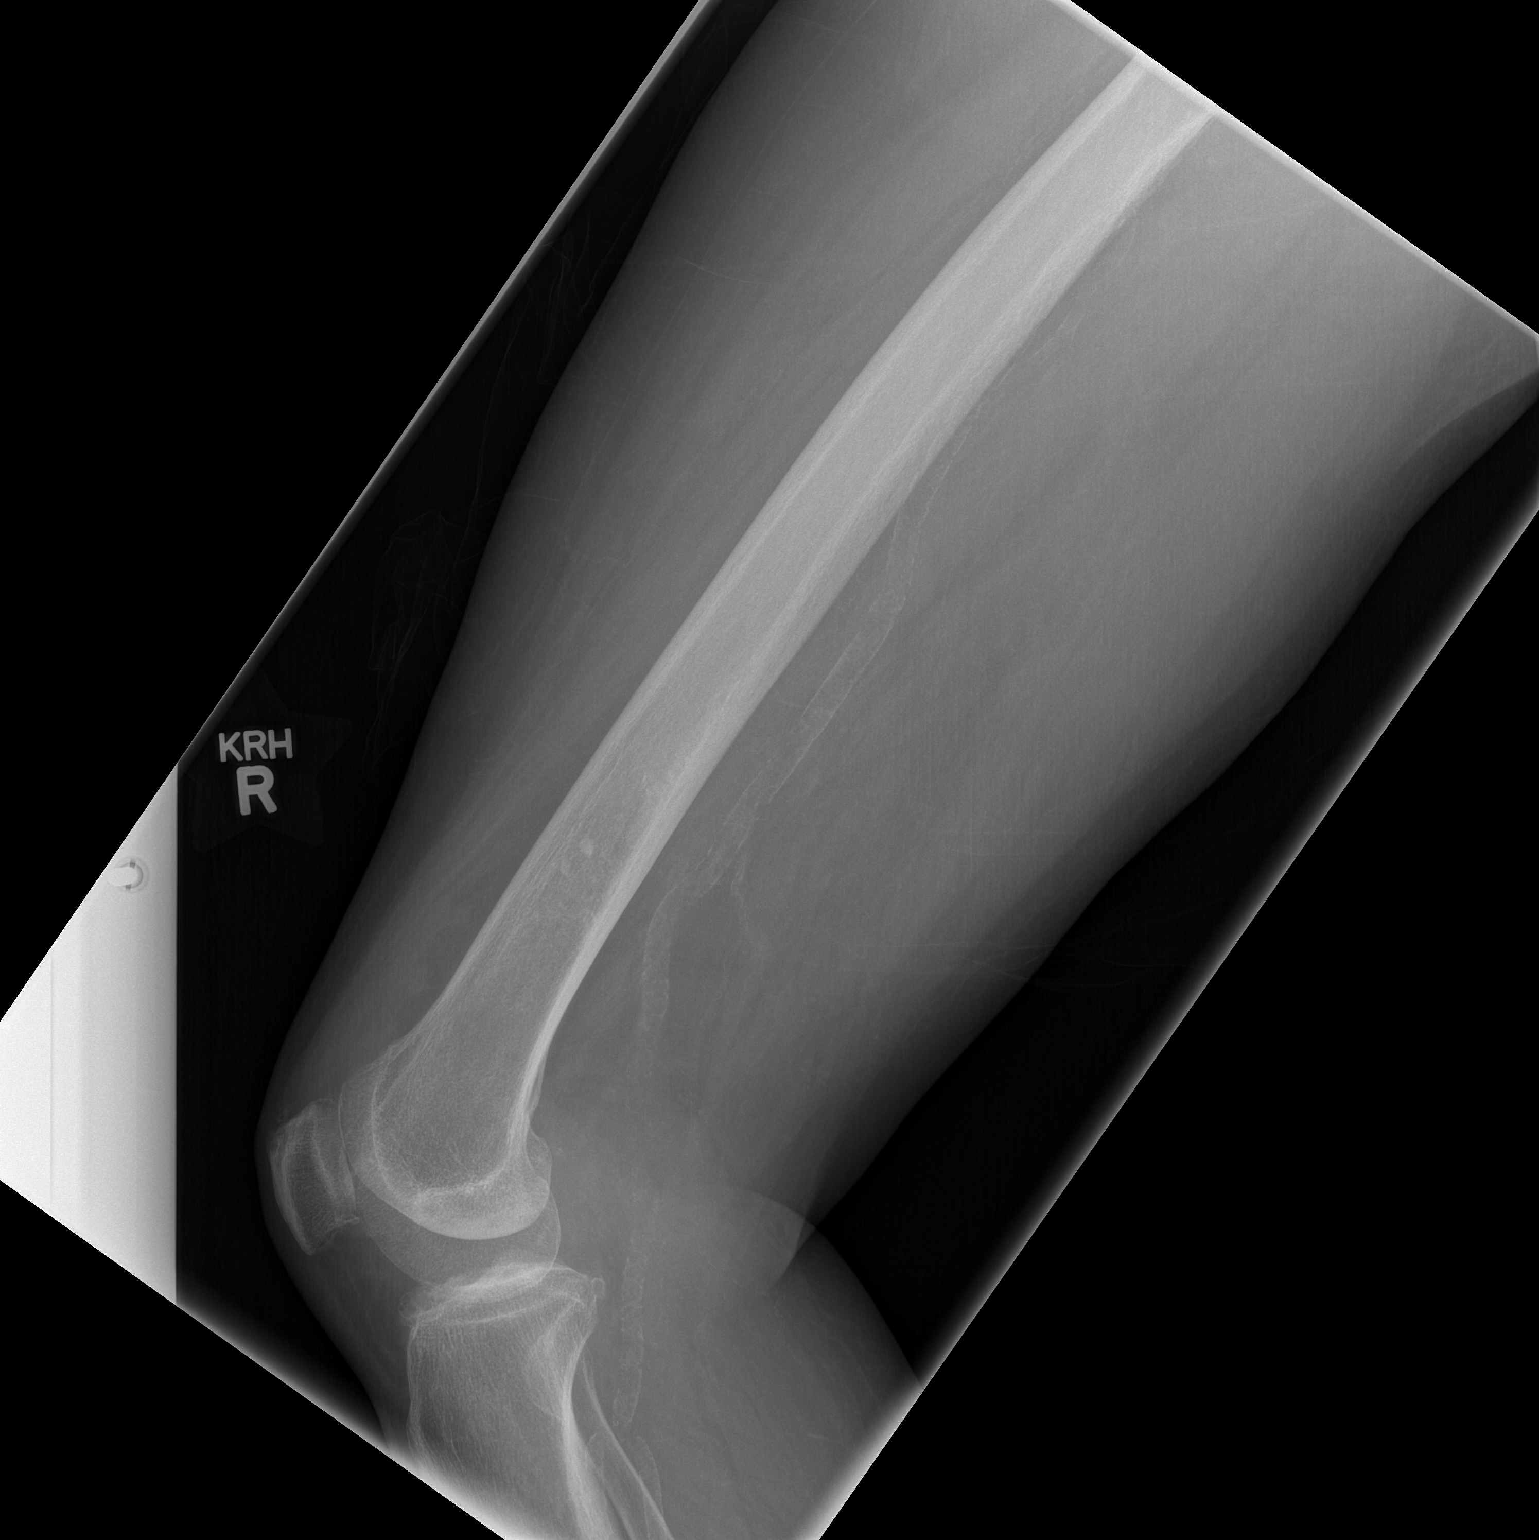

[2 of 2 positions shown; findings below may reference images not displayed]

FINDINGS: There is no evidence of fracture or other focal bone lesions. No
foreign body in the soft tissues. Advanced vascular calcification.
IMPRESSION: Negative.

## 2015-05-25 ENCOUNTER — Emergency Department (HOSPITAL_BASED_OUTPATIENT_CLINIC_OR_DEPARTMENT_OTHER): Payer: Medicaid Other

## 2015-05-25 ENCOUNTER — Encounter (HOSPITAL_BASED_OUTPATIENT_CLINIC_OR_DEPARTMENT_OTHER): Payer: Self-pay | Admitting: Emergency Medicine

## 2015-05-25 ENCOUNTER — Emergency Department (HOSPITAL_BASED_OUTPATIENT_CLINIC_OR_DEPARTMENT_OTHER)
Admission: EM | Admit: 2015-05-25 | Discharge: 2015-05-25 | Disposition: A | Discharge status: Home / Self Care | Payer: Medicaid Other | Attending: Emergency Medicine | Admitting: Emergency Medicine

## 2015-05-25 DIAGNOSIS — G8929 Other chronic pain: Secondary | ICD-10-CM | POA: Diagnosis not present

## 2015-05-25 DIAGNOSIS — J45909 Unspecified asthma, uncomplicated: Secondary | ICD-10-CM | POA: Insufficient documentation

## 2015-05-25 DIAGNOSIS — S24109A Unspecified injury at unspecified level of thoracic spinal cord, initial encounter: Secondary | ICD-10-CM | POA: Diagnosis present

## 2015-05-25 DIAGNOSIS — S29012A Strain of muscle and tendon of back wall of thorax, initial encounter: Secondary | ICD-10-CM | POA: Diagnosis not present

## 2015-05-25 DIAGNOSIS — S8991XA Unspecified injury of right lower leg, initial encounter: Secondary | ICD-10-CM | POA: Insufficient documentation

## 2015-05-25 DIAGNOSIS — W19XXXA Unspecified fall, initial encounter: Secondary | ICD-10-CM

## 2015-05-25 DIAGNOSIS — I10 Essential (primary) hypertension: Secondary | ICD-10-CM | POA: Diagnosis not present

## 2015-05-25 DIAGNOSIS — Y9389 Activity, other specified: Secondary | ICD-10-CM | POA: Insufficient documentation

## 2015-05-25 DIAGNOSIS — Z7952 Long term (current) use of systemic steroids: Secondary | ICD-10-CM | POA: Insufficient documentation

## 2015-05-25 DIAGNOSIS — Z79899 Other long term (current) drug therapy: Secondary | ICD-10-CM | POA: Insufficient documentation

## 2015-05-25 DIAGNOSIS — S0990XA Unspecified injury of head, initial encounter: Secondary | ICD-10-CM | POA: Insufficient documentation

## 2015-05-25 DIAGNOSIS — Z7951 Long term (current) use of inhaled steroids: Secondary | ICD-10-CM | POA: Diagnosis not present

## 2015-05-25 DIAGNOSIS — Y92003 Bedroom of unspecified non-institutional (private) residence as the place of occurrence of the external cause: Secondary | ICD-10-CM | POA: Insufficient documentation

## 2015-05-25 DIAGNOSIS — Z792 Long term (current) use of antibiotics: Secondary | ICD-10-CM | POA: Insufficient documentation

## 2015-05-25 DIAGNOSIS — Z87891 Personal history of nicotine dependence: Secondary | ICD-10-CM | POA: Insufficient documentation

## 2015-05-25 DIAGNOSIS — E119 Type 2 diabetes mellitus without complications: Secondary | ICD-10-CM | POA: Insufficient documentation

## 2015-05-25 DIAGNOSIS — Y998 Other external cause status: Secondary | ICD-10-CM | POA: Diagnosis not present

## 2015-05-25 DIAGNOSIS — W01198A Fall on same level from slipping, tripping and stumbling with subsequent striking against other object, initial encounter: Secondary | ICD-10-CM | POA: Diagnosis not present

## 2015-05-25 DIAGNOSIS — G40909 Epilepsy, unspecified, not intractable, without status epilepticus: Secondary | ICD-10-CM | POA: Insufficient documentation

## 2015-05-25 DIAGNOSIS — Z9889 Other specified postprocedural states: Secondary | ICD-10-CM | POA: Insufficient documentation

## 2015-05-25 DIAGNOSIS — M25561 Pain in right knee: Secondary | ICD-10-CM

## 2015-05-25 DIAGNOSIS — K219 Gastro-esophageal reflux disease without esophagitis: Secondary | ICD-10-CM | POA: Insufficient documentation

## 2015-05-25 DIAGNOSIS — M1990 Unspecified osteoarthritis, unspecified site: Secondary | ICD-10-CM | POA: Diagnosis not present

## 2015-05-25 MED ORDER — OXYCODONE-ACETAMINOPHEN 5-325 MG PO TABS
2.0000 | ORAL_TABLET | Freq: Once | ORAL | Status: AC
  Administered 2015-05-25: 2 via ORAL
  Filled 2015-05-25: qty 2

## 2015-05-25 MED ORDER — DIAZEPAM 5 MG PO TABS
5.0000 mg | ORAL_TABLET | Freq: Two times a day (BID) | ORAL | Status: DC | PRN
Start: 2015-05-25 — End: 2015-08-02

## 2015-05-25 MED ORDER — HYDROMORPHONE HCL 1 MG/ML IJ SOLN
1.0000 mg | Freq: Once | INTRAMUSCULAR | Status: AC
  Administered 2015-05-25: 1 mg via INTRAMUSCULAR
  Filled 2015-05-25: qty 1

## 2015-05-25 NOTE — ED Notes (Signed)
PA at bedside.

## 2015-05-25 NOTE — ED Notes (Signed)
Delay in medication-patient in CT 

## 2015-05-25 NOTE — Discharge Instructions (Signed)
Take Valium as needed as directed for muscle spasm. No driving or operating heavy machinery while taking valium. This medication may cause drowsiness. Rest, apply ice and heat intermittently. Follow up with your primary care doctor.  Muscle Strain A muscle strain is an injury that occurs when a muscle is stretched beyond its normal length. Usually a small number of muscle fibers are torn when this happens. Muscle strain is rated in degrees. First-degree strains have the least amount of muscle fiber tearing and pain. Second-degree and third-degree strains have increasingly more tearing and pain.  Usually, recovery from muscle strain takes 1-2 weeks. Complete healing takes 5-6 weeks.  CAUSES  Muscle strain happens when a sudden, violent force placed on a muscle stretches it too far. This may occur with lifting, sports, or a fall.  RISK FACTORS Muscle strain is especially common in athletes.  SIGNS AND SYMPTOMS At the site of the muscle strain, there may be:  Pain.  Bruising.  Swelling.  Difficulty using the muscle due to pain or lack of normal function. DIAGNOSIS  Your health care provider will perform a physical exam and ask about your medical history. TREATMENT  Often, the best treatment for a muscle strain is resting, icing, and applying cold compresses to the injured area.  HOME CARE INSTRUCTIONS   Use the PRICE method of treatment to promote muscle healing during the first 2-3 days after your injury. The PRICE method involves:  Protecting the muscle from being injured again.  Restricting your activity and resting the injured body part.  Icing your injury. To do this, put ice in a plastic bag. Place a towel between your skin and the bag. Then, apply the ice and leave it on from 15-20 minutes each hour. After the third day, switch to moist heat packs.  Apply compression to the injured area with a splint or elastic bandage. Be careful not to wrap it too tightly. This may interfere  with blood circulation or increase swelling.  Elevate the injured body part above the level of your heart as often as you can.  Only take over-the-counter or prescription medicines for pain, discomfort, or fever as directed by your health care provider.  Warming up prior to exercise helps to prevent future muscle strains. SEEK MEDICAL CARE IF:   You have increasing pain or swelling in the injured area.  You have numbness, tingling, or a significant loss of strength in the injured area. MAKE SURE YOU:   Understand these instructions.  Will watch your condition.  Will get help right away if you are not doing well or get worse. Document Released: 11/06/2005 Document Revised: 08/27/2013 Document Reviewed: 06/05/2013 [facility] Patient Information 2015 [place], [place]. This information is not intended to replace advice given to you by your health care provider. Make sure you discuss any questions you have with your health care provider.  Back Pain, Adult Low back pain is very common. About 1 in 5 people have back pain.The cause of low back pain is rarely dangerous. The pain often gets better over time.About half of people with a sudden onset of back pain feel better in just 2 weeks. About 8 in 10 people feel better by 6 weeks.  CAUSES Some common causes of back pain include:  Strain of the muscles or ligaments supporting the spine.  Wear and tear (degeneration) of the spinal discs.  Arthritis.  Direct injury to the back. DIAGNOSIS Most of the time, the direct cause of low back pain is not  known.However, back pain can be treated effectively even when the exact cause of the pain is unknown.Answering your caregiver's questions about your overall health and symptoms is one of the most accurate ways to make sure the cause of your pain is not dangerous. If your caregiver needs more information, he or she may order lab work or imaging tests (X-rays or MRIs).However, even if imaging tests  show changes in your back, this usually does not require surgery. HOME CARE INSTRUCTIONS For many people, back pain returns.Since low back pain is rarely dangerous, it is often a condition that people can learn to [facility] their own.   Remain active. It is stressful on the back to sit or stand in one place. Do not sit, drive, or stand in one place for more than 30 minutes at a time. Take short walks on level surfaces as soon as pain allows.Try to increase the length of time you walk each day.  Do not stay in bed.Resting more than 1 or 2 days can delay your recovery.  Do not avoid exercise or work.Your body is made to move.It is not dangerous to be active, even though your back may hurt.Your back will likely heal faster if you return to being active before your pain is gone.  Pay attention to your body when you bend and lift. Many people have less discomfortwhen lifting if they bend their knees, keep the load close to their bodies,and avoid twisting. Often, the most comfortable positions are those that put less stress on your recovering back.  Find a comfortable position to sleep. Use a firm mattress and lie on your side with your knees slightly bent. If you lie on your back, put a pillow under your knees.  Only take over-the-counter or prescription medicines as directed by your caregiver. Over-the-counter medicines to reduce pain and inflammation are often the most helpful.Your caregiver may prescribe muscle relaxant drugs.These medicines help dull your pain so you can more quickly return to your normal activities and healthy exercise.  Put ice on the injured area.  Put ice in a plastic bag.  Place a towel between your skin and the bag.  Leave the ice on for 15-20 minutes, 03-04 times a day for the first 2 to 3 days. After that, ice and heat may be alternated to reduce pain and spasms.  Ask your caregiver about trying back exercises and gentle massage. This may be of some  benefit.  Avoid feeling anxious or stressed.Stress increases muscle tension and can worsen back pain.It is important to recognize when you are anxious or stressed and learn ways to manage it.Exercise is a great option. SEEK MEDICAL CARE IF:  You have pain that is not relieved with rest or medicine.  You have pain that does not improve in 1 week.  You have new symptoms.  You are generally not feeling well. SEEK IMMEDIATE MEDICAL CARE IF:   You have pain that radiates from your back into your legs.  You develop new bowel or bladder control problems.  You have unusual weakness or numbness in your arms or legs.  You develop nausea or vomiting.  You develop abdominal pain.  You feel faint. Document Released: 11/06/2005 Document Revised: 05/07/2012 Document Reviewed: 03/10/2014 [facility] Patient Information 2015 [place], [place]. This information is not intended to replace advice given to you by your health care provider. Make sure you discuss any questions you have with your health care provider.  Knee Pain The knee is the complex joint between  your thigh and your lower leg. It is made up of bones, tendons, ligaments, and cartilage. The bones that make up the knee are:  The femur in the thigh.  The tibia and fibula in the lower leg.  The patella or kneecap riding in the groove on the lower femur. CAUSES  Knee pain is a common complaint with many causes. A few of these causes are:  Injury, such as:  A ruptured ligament or tendon injury.  Torn cartilage.  Medical conditions, such as:  Gout  Arthritis  Infections  Overuse, over training, or overdoing a physical activity. Knee pain can be minor or severe. Knee pain can accompany debilitating injury. Minor knee problems often respond well to self-care measures or get well on their own. More serious injuries may need medical intervention or even surgery. SYMPTOMS The knee is complex. Symptoms of knee problems can vary  widely. Some of the problems are:  Pain with movement and weight bearing.  Swelling and tenderness.  Buckling of the knee.  Inability to straighten or extend your knee.  Your knee locks and you cannot straighten it.  Warmth and redness with pain and fever.  Deformity or dislocation of the kneecap. DIAGNOSIS  Determining what is wrong may be very straight forward such as when there is an injury. It can also be challenging because of the complexity of the knee. Tests to make a diagnosis may include:  Your caregiver taking a history and doing a physical exam.  Routine X-rays can be used to rule out other problems. X-rays will not reveal a cartilage tear. Some injuries of the knee can be diagnosed by:  Arthroscopy a surgical technique by which a small video camera is inserted through tiny incisions on the sides of the knee. This procedure is used to examine and repair internal knee joint problems. Tiny instruments can be used during arthroscopy to repair the torn knee cartilage (meniscus).  Arthrography is a radiology technique. A contrast liquid is directly injected into the knee joint. Internal structures of the knee joint then become visible on X-ray film.  An MRI scan is a non X-ray radiology procedure in which magnetic fields and a computer produce two- or three-dimensional images of the inside of the knee. Cartilage tears are often visible using an MRI scanner. MRI scans have largely replaced arthrography in diagnosing cartilage tears of the knee.  Blood work.  Examination of the fluid that helps to lubricate the knee joint (synovial fluid). This is done by taking a sample out using a needle and a syringe. TREATMENT The treatment of knee problems depends on the cause. Some of these treatments are:  Depending on the injury, proper casting, splinting, surgery, or physical therapy care will be needed.  Give yourself adequate recovery time. Do not overuse your joints. If you begin  to get sore during workout routines, back off. Slow down or do fewer repetitions.  For repetitive activities such as cycling or running, maintain your strength and nutrition.  Alternate muscle groups. For example, if you are a weight lifter, work the upper body on one day and the lower body the next.  Either tight or weak muscles do not give the proper support for your knee. Tight or weak muscles do not absorb the stress placed on the knee joint. Keep the muscles surrounding the knee strong.  Take care of mechanical problems.  If you have flat feet, orthotics or special shoes may help. See your caregiver if you need help.  Arch supports, sometimes with wedges on the inner or outer aspect of the heel, can help. These can shift pressure away from the side of the knee most bothered by osteoarthritis.  A brace called an "unloader" brace also may be used to help ease the pressure on the most arthritic side of the knee.  If your caregiver has prescribed crutches, braces, wraps or ice, use as directed. The acronym for this is PRICE. This means protection, rest, ice, compression, and elevation.  Nonsteroidal anti-inflammatory drugs (NSAIDs), can help relieve pain. But if taken immediately after an injury, they may actually increase swelling. Take NSAIDs with food in your stomach. Stop them if you develop stomach problems. Do not take these if you have a history of ulcers, stomach pain, or bleeding from the bowel. Do not take without your caregiver's approval if you have problems with fluid retention, heart failure, or kidney problems.  For ongoing knee problems, physical therapy may be helpful.  Glucosamine and chondroitin are over-the-counter dietary supplements. Both may help relieve the pain of osteoarthritis in the knee. These medicines are different from the usual anti-inflammatory drugs. Glucosamine may decrease the rate of cartilage destruction.  Injections of a corticosteroid drug into your  knee joint may help reduce the symptoms of an arthritis flare-up. They may provide pain relief that lasts a few months. You may have to wait a few months between injections. The injections do have a small increased risk of infection, water retention, and elevated blood sugar levels.  Hyaluronic acid injected into damaged joints may ease pain and provide lubrication. These injections may work by reducing inflammation. A series of shots may give relief for as long as 6 months.  Topical painkillers. Applying certain ointments to your skin may help relieve the pain and stiffness of osteoarthritis. Ask your pharmacist for suggestions. Many over the-counter products are approved for temporary relief of arthritis pain.  In some countries, doctors often prescribe topical NSAIDs for relief of chronic conditions such as arthritis and tendinitis. A review of treatment with NSAID creams found that they worked as well as oral medications but without the serious side effects. PREVENTION  Maintain a healthy weight. Extra pounds put more strain on your joints.  Get strong, stay limber. Weak muscles are a common cause of knee injuries. Stretching is important. Include flexibility exercises in your workouts.  Be smart about exercise. If you have osteoarthritis, chronic knee pain or recurring injuries, you may need to change the way you exercise. This does not mean you have to stop being active. If your knees ache after jogging or playing basketball, consider switching to swimming, water aerobics, or other low-impact activities, at least for a few days a week. Sometimes limiting high-impact activities will provide relief.  Make sure your shoes fit well. Choose footwear that is right for your sport.  Protect your knees. Use the proper gear for knee-sensitive activities. Use kneepads when playing volleyball or laying carpet. Buckle your seat belt every time you drive. Most shattered kneecaps occur in car  accidents.  Rest when you are tired. SEEK MEDICAL CARE IF:  You have knee pain that is continual and does not seem to be getting better.  SEEK IMMEDIATE MEDICAL CARE IF:  Your knee joint feels hot to the touch and you have a high fever. MAKE SURE YOU:   Understand these instructions.  Will watch your condition.  Will get help right away if you are not doing well or get worse. Document Released: 09/03/2007  Document Revised: 01/29/2012 Document Reviewed: 09/03/2007 [facility] Patient Information 2015 [place], [place]. This information is not intended to replace advice given to you by your health care provider. Make sure you discuss any questions you have with your health care provider.

## 2015-05-25 NOTE — ED Provider Notes (Signed)
CSN: [id number]     Arrival date & time 05/25/15  2020 History   First MD Initiated Contact with Patient 05/25/15 2030     Chief Complaint  Patient presents with  . Fall     (Consider location/radiation/quality/duration/timing/severity/associated sxs/prior Treatment) HPI Comments: 59 year old female complaining of mid back pain and right knee pain after a fall occurring yesterday evening. Patient reports she was trying to stand up off of her bed and grab onto her walker, when the walker brakes were not working well causing a walker to slip which made her fall backwards. She hit her head on the footboard of the bed and her back on the ottoman. No loss of consciousness. Back and knee pain 10/10, worse with any movement. History of chronic pain and takes oxycodone 7.5 and Opana 40 mg daily, however is not giving her relief of the pain. Reports a slight headache. Denies dizziness, lightheadedness, vomiting, vision change. Denies extremity numbness or weakness, loss of control of bowels or bladder or saddle anesthesia. Ambulates with a walker at baseline and occasionally uses wheelchair.  Patient is a 59 y.o. female presenting with fall. The history is provided by the patient and the spouse.  Fall Associated symptoms include headaches.    Past Medical History  Diagnosis Date  . Arthritis   . Asthma   . Hypertension   . Constipation   . GERD (gastroesophageal reflux disease)   . Diabetes mellitus without complication   . Chronic pain   . Seizures     last seizure March 2015   Past Surgical History  Procedure Laterality Date  . Back surgery    . Cholecystectomy    . Appendectomy     Family History  Problem Relation Age of Onset  . Kidney failure Mother   . Hypertension Sister   . Hypertension Brother    History  Substance Use Topics  . Smoking status: Former Games developer  . Smokeless tobacco: Never Used  . Alcohol Use: Yes     Comment: admits to 2 drinks/week   OB History    No  data available     Review of Systems  Musculoskeletal: Positive for back pain.       + R knee pain.  Neurological: Positive for headaches.  All other systems reviewed and are negative.     Allergies  Ivp dye  Home Medications   Prior to Admission medications   Medication Sig Start Date End Date Taking? Authorizing Provider  amLODipine (NORVASC) 5 MG tablet Take 5 mg by mouth daily.   Yes Historical Provider, MD  dicloxacillin (DYNAPEN) 500 MG capsule Take 1 capsule (500 mg total) by mouth 3 (three) times daily. 01/27/15  Yes Calvert Cantor, MD  esomeprazole (NEXIUM) 40 MG capsule Take 40 mg by mouth daily at 12 noon.   Yes Historical Provider, MD  Fluticasone-Salmeterol (ADVAIR) 250-50 MCG/DOSE AEPB Inhale 1 puff into the lungs 2 (two) times daily.   Yes Historical Provider, MD  folic acid (FOLVITE) 1 MG tablet Take 1 mg by mouth daily.   Yes Historical Provider, MD  lipase/protease/amylase (CREON) 12000 UNITS CPEP capsule Take by mouth.   Yes Historical Provider, MD  metoprolol succinate (TOPROL-XL) 25 MG 24 hr tablet Take 25 mg by mouth daily.   Yes Historical Provider, MD  OxyCODONE HCl 7.5 MG TABA Take 1 tablet by mouth every 6 (six) hours as needed. 04/07/15  Yes Geoffery Lyons, MD  oxymorphone (OPANA ER) 40 MG 12 hr tablet Take 40  mg by mouth every 12 (twelve) hours.   Yes Historical Provider, MD  potassium chloride SA (K-DUR,KLOR-CON) 20 MEQ tablet Take 2 tablets (40 mEq total) by mouth daily. 01/27/15  Yes Calvert Cantor, MD  predniSONE (DELTASONE) 5 MG tablet Take 3 tablets (15 mg total) by mouth daily with breakfast. 01/27/15  Yes Calvert Cantor, MD  tiZANidine (ZANAFLEX) 4 MG tablet Take 0.5 tablets (2 mg total) by mouth 2 (two) times daily as needed for muscle spasms. 01/27/15  Yes Calvert Cantor, MD  triamterene-hydrochlorothiazide (MAXZIDE-25) 37.5-25 MG per tablet Take 1 tablet by mouth daily.   Yes Historical Provider, MD  Vitamin D, Ergocalciferol, (DRISDOL) 50000 UNITS CAPS capsule  Take 1 capsule (50,000 Units total) by mouth every 7 (seven) days. Sunday 01/27/15  Yes Calvert Cantor, MD  VITAMIN E PO Take 1 capsule by mouth daily.   Yes Historical Provider, MD  albuterol (PROVENTIL HFA;VENTOLIN HFA) 108 (90 BASE) MCG/ACT inhaler Inhale 2 puffs into the lungs every 6 (six) hours as needed for wheezing or shortness of breath.    Historical Provider, MD  levETIRAcetam (KEPPRA) 500 MG tablet Take 1 tablet (500 mg total) by mouth 2 (two) times daily. 01/27/15   Calvert Cantor, MD   BP 131/93 mmHg  Pulse 96  Temp(Src) 98.4 F (36.9 C) (Oral)  Resp 18  Ht 5' (1.524 m)  Wt 136 lb (61.689 kg)  BMI 26.56 kg/m2  SpO2 100% Physical Exam  Constitutional: She is oriented to person, place, and time. She appears well-developed and well-nourished. No distress.  HENT:  Head: Normocephalic and atraumatic. Head is without Battle's sign and without contusion.  Right Ear: No hemotympanum.  Left Ear: No hemotympanum.  Mouth/Throat: Oropharynx is clear and moist.  No hemotympanums bilateral.  Eyes: Conjunctivae and EOM are normal. Pupils are equal, round, and reactive to light.  Neck: Normal range of motion. Neck supple.  Cardiovascular: Normal rate, regular rhythm and normal heart sounds.   Pulmonary/Chest: Effort normal and breath sounds normal. No respiratory distress.  Musculoskeletal: She exhibits no edema.  Vertical surgical scar over thoracic back. TTP right thoracic paraspinal muscles. No spinous process tenderness. Lumbar spine and cervical spine normal. TTP lateral aspect of right knee. No swelling or deformity. Full range of motion.  Neurological: She is alert and oriented to person, place, and time. No sensory deficit.  Speech fluent, goal oriented. Moves all extremities without ataxia. Strength upper and lower extremities 5/5 and equal bilateral.  Skin: Skin is warm and dry.  Psychiatric: She has a normal mood and affect. Her behavior is normal.  Nursing note and vitals  reviewed.   ED Course  Procedures (including critical care time) Labs Review Labs Reviewed - No data to display  Imaging Review Dg Thoracic Spine 2 View  05/25/2015   CLINICAL DATA:  Fall. Thoracic spine pain diffusely. Surgery 2 years ago.  EXAM: THORACIC SPINE - 2-3 VIEWS  COMPARISON:  01/25/2015  FINDINGS: T5 corpectomy spacer observed extending up to involve part of the T4 vertebral body. Posterolateral rod and pedicle screw fixator noted, not appreciably changed from 01/25/2015.  Right upper lateral rib deformities, chronic and stable. T12 compression fracture, chronic, stable. Considerable cervical spondylosis noted with loss of vertebral body height at C3-4 and 4 mm anterolisthesis at C3-4. Questionable prevertebral soft tissue swelling in the cervical spine.  The upper vertebral bodies have indistinct cortical margins. There might be some superior endplate concavity at T3 which was not present on 01/25/2015, although this is not  entirely certain. Questionable linear lucency along the left hemidiaphragm on the lateral projection, probably from atelectasis.  IMPRESSION: 1. Questionable superior endplate concavity at T3 could conceivably represent an interval fracture compared to 01/25/15. CT of the thoracic spine would provide better definition. 2. Cervical spondylosis with 4 mm of anterolisthesis at C3-4, this subluxation is age indeterminate. Consider dedicated imaging of the cervical spine. 3. Prior corpectomy at T5 with spanning posterolateral rod and pedicle screw fixation as before. 4. Faint linear opacity along one of the hemidiaphragms on the lateral view, possibly the left, probably from atelectasis. 5. Old compression at T12.   Electronically Signed   By: Gaylyn Rong M.D.   On: 05/25/2015 21:21   Dg Knee Complete 4 Views Right  05/25/2015   CLINICAL DATA:  Status post fall, with right knee pain. Initial encounter.  EXAM: RIGHT KNEE - COMPLETE 4+ VIEW  COMPARISON:  None.  FINDINGS:  There is no evidence of fracture or dislocation. Marginal osteophytes are seen arising at the medial and lateral compartments, with sclerotic change at the lateral compartment and associated mild cortical irregularity. The joint spaces are preserved. No significant degenerative change is seen; the patellofemoral joint is grossly unremarkable in appearance.  Trace knee joint fluid remains within normal limits. Diffuse vascular calcifications are seen.  IMPRESSION: 1. No evidence of fracture or dislocation. 2. 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 3. Diffuse vascular calcifications seen.   Electronically Signed   By: Roanna Raider M.D.   On: 05/25/2015 21:13     EKG Interpretation None      MDM   Final diagnoses:  None   Patient presenting after mechanical fall. Nontoxic appearing, NAD. AF VSS. No loss of consciousness. No focal neurologic deficits. Doubt intracranial bleed. She is not on any blood thinners. Regarding back and knee pain, x-rays obtained. Results as stated above. Will obtain CT of T-spine and C-spine. Pt signed out to Dr. Littie Deeds at shift change. Awaiting CT results.  Kathrynn Speed, PA-C 05/25/15 2235  Mirian Mo, MD 05/28/15 [phone]

## 2015-05-25 NOTE — ED Notes (Signed)
MD at bedside. 

## 2015-05-25 NOTE — ED Notes (Signed)
Pt c/o fall last night with back and knee pain. Denies LOC. Ambulates with walker/wheelchair baseline.

## 2015-06-07 ENCOUNTER — Other Ambulatory Visit: Payer: Self-pay | Admitting: Internal Medicine

## 2015-06-11 ENCOUNTER — Inpatient Hospital Stay (HOSPITAL_COMMUNITY)
Admission: EM | Admit: 2015-06-11 | Discharge: 2015-06-14 | DRG: 189 | Disposition: A | Discharge status: Home / Self Care | Payer: Medicaid Other | Attending: Internal Medicine | Admitting: Internal Medicine

## 2015-06-11 ENCOUNTER — Encounter (HOSPITAL_COMMUNITY): Payer: Self-pay | Admitting: Emergency Medicine

## 2015-06-11 ENCOUNTER — Emergency Department (HOSPITAL_COMMUNITY): Payer: Medicaid Other

## 2015-06-11 DIAGNOSIS — J45901 Unspecified asthma with (acute) exacerbation: Secondary | ICD-10-CM | POA: Diagnosis present

## 2015-06-11 DIAGNOSIS — E1165 Type 2 diabetes mellitus with hyperglycemia: Secondary | ICD-10-CM | POA: Diagnosis present

## 2015-06-11 DIAGNOSIS — K219 Gastro-esophageal reflux disease without esophagitis: Secondary | ICD-10-CM | POA: Diagnosis present

## 2015-06-11 DIAGNOSIS — Z87898 Personal history of other specified conditions: Secondary | ICD-10-CM

## 2015-06-11 DIAGNOSIS — D509 Iron deficiency anemia, unspecified: Secondary | ICD-10-CM | POA: Diagnosis present

## 2015-06-11 DIAGNOSIS — I517 Cardiomegaly: Secondary | ICD-10-CM | POA: Diagnosis present

## 2015-06-11 DIAGNOSIS — E860 Dehydration: Secondary | ICD-10-CM | POA: Diagnosis present

## 2015-06-11 DIAGNOSIS — G629 Polyneuropathy, unspecified: Secondary | ICD-10-CM | POA: Diagnosis present

## 2015-06-11 DIAGNOSIS — I10 Essential (primary) hypertension: Secondary | ICD-10-CM | POA: Diagnosis present

## 2015-06-11 DIAGNOSIS — I248 Other forms of acute ischemic heart disease: Secondary | ICD-10-CM | POA: Diagnosis present

## 2015-06-11 DIAGNOSIS — R000 Tachycardia, unspecified: Secondary | ICD-10-CM | POA: Diagnosis present

## 2015-06-11 DIAGNOSIS — J9600 Acute respiratory failure, unspecified whether with hypoxia or hypercapnia: Secondary | ICD-10-CM | POA: Diagnosis present

## 2015-06-11 DIAGNOSIS — E872 Acidosis: Secondary | ICD-10-CM | POA: Diagnosis present

## 2015-06-11 DIAGNOSIS — G40909 Epilepsy, unspecified, not intractable, without status epilepticus: Secondary | ICD-10-CM | POA: Diagnosis present

## 2015-06-11 DIAGNOSIS — Z794 Long term (current) use of insulin: Secondary | ICD-10-CM

## 2015-06-11 DIAGNOSIS — J9601 Acute respiratory failure with hypoxia: Principal | ICD-10-CM | POA: Diagnosis present

## 2015-06-11 DIAGNOSIS — N179 Acute kidney failure, unspecified: Secondary | ICD-10-CM | POA: Diagnosis present

## 2015-06-11 DIAGNOSIS — R0602 Shortness of breath: Secondary | ICD-10-CM

## 2015-06-11 DIAGNOSIS — E1140 Type 2 diabetes mellitus with diabetic neuropathy, unspecified: Secondary | ICD-10-CM | POA: Diagnosis present

## 2015-06-11 DIAGNOSIS — J9811 Atelectasis: Secondary | ICD-10-CM | POA: Diagnosis present

## 2015-06-11 DIAGNOSIS — M064 Inflammatory polyarthropathy: Secondary | ICD-10-CM | POA: Diagnosis present

## 2015-06-11 DIAGNOSIS — F17210 Nicotine dependence, cigarettes, uncomplicated: Secondary | ICD-10-CM | POA: Diagnosis present

## 2015-06-11 DIAGNOSIS — R079 Chest pain, unspecified: Secondary | ICD-10-CM

## 2015-06-11 DIAGNOSIS — I249 Acute ischemic heart disease, unspecified: Secondary | ICD-10-CM

## 2015-06-11 DIAGNOSIS — Z7952 Long term (current) use of systemic steroids: Secondary | ICD-10-CM

## 2015-06-11 DIAGNOSIS — G894 Chronic pain syndrome: Secondary | ICD-10-CM | POA: Diagnosis present

## 2015-06-11 DIAGNOSIS — I5033 Acute on chronic diastolic (congestive) heart failure: Secondary | ICD-10-CM | POA: Diagnosis present

## 2015-06-11 LAB — COMPREHENSIVE METABOLIC PANEL
ALK PHOS: 103 U/L (ref 38–126)
ALT: 17 U/L (ref 14–54)
AST: 30 U/L (ref 15–41)
Albumin: 3.6 g/dL (ref 3.5–5.0)
Anion gap: 13 (ref 5–15)
BILIRUBIN TOTAL: 0.2 mg/dL — AB (ref 0.3–1.2)
BUN: 12 mg/dL (ref 6–20)
CO2: 20 mmol/L — ABNORMAL LOW (ref 22–32)
Calcium: 9.4 mg/dL (ref 8.9–10.3)
Chloride: 105 mmol/L (ref 101–111)
Creatinine, Ser: 1.1 mg/dL — ABNORMAL HIGH (ref 0.44–1.00)
GFR calc Af Amer: >60 mL/min (ref >60)
GFR calc non Af Amer: 55 mL/min — ABNORMAL LOW (ref >60)
Glucose, Bld: 147 mg/dL — ABNORMAL HIGH (ref 65–99)
Potassium: 3.4 mmol/L — ABNORMAL LOW (ref 3.5–5.1)
SODIUM: 138 mmol/L (ref 135–145)
Total Protein: 7.2 g/dL (ref 6.5–8.1)

## 2015-06-11 LAB — I-STAT ARTERIAL BLOOD GAS, ED
Acid-base deficit: 4 mmol/L — ABNORMAL HIGH (ref 0.0–2.0)
Bicarbonate: 20.2 mEq/L (ref 20.0–24.0)
O2 Saturation: 100 %
PCO2 ART: 32.2 mmHg — AB (ref 35.0–45.0)
PO2 ART: 239 mmHg — AB (ref 80.0–100.0)
TCO2: 21 mmol/L (ref 0–100)
pH, Arterial: 7.407 (ref 7.350–7.450)

## 2015-06-11 LAB — CBC
HEMATOCRIT: 32 % — AB (ref 36.0–46.0)
Hemoglobin: 10.3 g/dL — ABNORMAL LOW (ref 12.0–15.0)
MCH: 26.3 pg (ref 26.0–34.0)
MCHC: 32.2 g/dL (ref 30.0–36.0)
MCV: 81.6 fL (ref 78.0–100.0)
Platelets: 318 10*3/uL (ref 150–400)
RBC: 3.92 MIL/uL (ref 3.87–5.11)
RDW: 14.9 % (ref 11.5–15.5)
WBC: 16.3 10*3/uL — ABNORMAL HIGH (ref 4.0–10.5)

## 2015-06-11 LAB — I-STAT TROPONIN, ED: Troponin i, poc: 0 ng/mL (ref 0.00–0.08)

## 2015-06-11 LAB — MAGNESIUM: MAGNESIUM: 2.9 mg/dL — AB (ref 1.7–2.4)

## 2015-06-11 LAB — TROPONIN I: TROPONIN I: 0.09 ng/mL — AB (ref <0.031)

## 2015-06-11 MED ORDER — ALBUTEROL (5 MG/ML) CONTINUOUS INHALATION SOLN
INHALATION_SOLUTION | RESPIRATORY_TRACT | Status: AC
  Administered 2015-06-11: 23:00:00 via RESPIRATORY_TRACT
  Filled 2015-06-11: qty 20

## 2015-06-11 MED ORDER — LORAZEPAM 2 MG/ML IJ SOLN
1.0000 mg | Freq: Once | INTRAMUSCULAR | Status: AC
  Administered 2015-06-11: 1 mg via INTRAVENOUS
  Filled 2015-06-11: qty 1

## 2015-06-11 MED ORDER — LORAZEPAM 2 MG/ML IJ SOLN
0.5000 mg | Freq: Once | INTRAMUSCULAR | Status: AC
  Administered 2015-06-11: 0.5 mg via INTRAVENOUS

## 2015-06-11 MED ORDER — LORAZEPAM 2 MG/ML IJ SOLN
INTRAMUSCULAR | Status: AC
  Filled 2015-06-11: qty 1

## 2015-06-11 MED ORDER — IPRATROPIUM-ALBUTEROL 0.5-2.5 (3) MG/3ML IN SOLN
RESPIRATORY_TRACT | Status: AC
  Administered 2015-06-11: 3 mL
  Filled 2015-06-11: qty 3

## 2015-06-11 NOTE — ED Notes (Signed)
Pt came from home respiratory distress, decreased lung sounds on arrival, placed on cpap. Pt very anxious and agitated on arrival. Ems gave 1:1 epi IM, 125 soul medrol ,2 gm Mag, 1 mg Atrovent.

## 2015-06-11 NOTE — ED Notes (Signed)
Boyfriend arrived and at bedside.

## 2015-06-11 NOTE — Progress Notes (Signed)
Patient came in with respiratory distress and RT placed patient on Bipap and started hour long CAT. After ABG results, removed bipap per MD and continued breathing treatment. RT will continue to monitor.

## 2015-06-11 NOTE — ED Notes (Signed)
Patient becoming more anxious and restless talking to boyfriend Doctor notified.

## 2015-06-12 ENCOUNTER — Inpatient Hospital Stay (HOSPITAL_COMMUNITY): Payer: Medicaid Other

## 2015-06-12 ENCOUNTER — Encounter (HOSPITAL_COMMUNITY): Payer: Self-pay | Admitting: Internal Medicine

## 2015-06-12 DIAGNOSIS — R079 Chest pain, unspecified: Secondary | ICD-10-CM | POA: Diagnosis not present

## 2015-06-12 DIAGNOSIS — J45901 Unspecified asthma with (acute) exacerbation: Secondary | ICD-10-CM | POA: Diagnosis present

## 2015-06-12 DIAGNOSIS — I10 Essential (primary) hypertension: Secondary | ICD-10-CM | POA: Diagnosis not present

## 2015-06-12 DIAGNOSIS — N179 Acute kidney failure, unspecified: Secondary | ICD-10-CM | POA: Diagnosis present

## 2015-06-12 DIAGNOSIS — R000 Tachycardia, unspecified: Secondary | ICD-10-CM | POA: Diagnosis present

## 2015-06-12 DIAGNOSIS — E872 Acidosis: Secondary | ICD-10-CM | POA: Diagnosis present

## 2015-06-12 DIAGNOSIS — I248 Other forms of acute ischemic heart disease: Secondary | ICD-10-CM | POA: Diagnosis present

## 2015-06-12 DIAGNOSIS — J9601 Acute respiratory failure with hypoxia: Secondary | ICD-10-CM | POA: Diagnosis present

## 2015-06-12 DIAGNOSIS — I249 Acute ischemic heart disease, unspecified: Secondary | ICD-10-CM | POA: Diagnosis not present

## 2015-06-12 DIAGNOSIS — R0602 Shortness of breath: Secondary | ICD-10-CM | POA: Diagnosis present

## 2015-06-12 DIAGNOSIS — E1140 Type 2 diabetes mellitus with diabetic neuropathy, unspecified: Secondary | ICD-10-CM | POA: Diagnosis present

## 2015-06-12 DIAGNOSIS — I517 Cardiomegaly: Secondary | ICD-10-CM | POA: Diagnosis present

## 2015-06-12 DIAGNOSIS — M79609 Pain in unspecified limb: Secondary | ICD-10-CM | POA: Diagnosis not present

## 2015-06-12 DIAGNOSIS — E1165 Type 2 diabetes mellitus with hyperglycemia: Secondary | ICD-10-CM | POA: Diagnosis present

## 2015-06-12 DIAGNOSIS — F17210 Nicotine dependence, cigarettes, uncomplicated: Secondary | ICD-10-CM | POA: Diagnosis present

## 2015-06-12 DIAGNOSIS — Z794 Long term (current) use of insulin: Secondary | ICD-10-CM | POA: Diagnosis not present

## 2015-06-12 DIAGNOSIS — G40909 Epilepsy, unspecified, not intractable, without status epilepticus: Secondary | ICD-10-CM | POA: Diagnosis present

## 2015-06-12 DIAGNOSIS — G629 Polyneuropathy, unspecified: Secondary | ICD-10-CM | POA: Diagnosis present

## 2015-06-12 DIAGNOSIS — J45909 Unspecified asthma, uncomplicated: Secondary | ICD-10-CM | POA: Insufficient documentation

## 2015-06-12 DIAGNOSIS — J9811 Atelectasis: Secondary | ICD-10-CM | POA: Diagnosis present

## 2015-06-12 DIAGNOSIS — K219 Gastro-esophageal reflux disease without esophagitis: Secondary | ICD-10-CM | POA: Diagnosis present

## 2015-06-12 DIAGNOSIS — J9600 Acute respiratory failure, unspecified whether with hypoxia or hypercapnia: Secondary | ICD-10-CM | POA: Diagnosis present

## 2015-06-12 DIAGNOSIS — I5033 Acute on chronic diastolic (congestive) heart failure: Secondary | ICD-10-CM | POA: Diagnosis present

## 2015-06-12 DIAGNOSIS — G894 Chronic pain syndrome: Secondary | ICD-10-CM | POA: Diagnosis present

## 2015-06-12 DIAGNOSIS — Z7952 Long term (current) use of systemic steroids: Secondary | ICD-10-CM | POA: Diagnosis not present

## 2015-06-12 DIAGNOSIS — D509 Iron deficiency anemia, unspecified: Secondary | ICD-10-CM | POA: Diagnosis present

## 2015-06-12 DIAGNOSIS — E860 Dehydration: Secondary | ICD-10-CM | POA: Diagnosis present

## 2015-06-12 DIAGNOSIS — M064 Inflammatory polyarthropathy: Secondary | ICD-10-CM | POA: Diagnosis present

## 2015-06-12 HISTORY — DX: Acute ischemic heart disease, unspecified: I24.9

## 2015-06-12 LAB — COMPREHENSIVE METABOLIC PANEL
ALT: 15 U/L (ref 14–54)
AST: 24 U/L (ref 15–41)
Albumin: 3.1 g/dL — ABNORMAL LOW (ref 3.5–5.0)
Alkaline Phosphatase: 92 U/L (ref 38–126)
Anion gap: 13 (ref 5–15)
BUN: 11 mg/dL (ref 6–20)
CO2: 18 mmol/L — AB (ref 22–32)
Calcium: 8.6 mg/dL — ABNORMAL LOW (ref 8.9–10.3)
Chloride: 109 mmol/L (ref 101–111)
Creatinine, Ser: 0.88 mg/dL (ref 0.44–1.00)
GFR calc Af Amer: >60 mL/min (ref >60)
GLUCOSE: 193 mg/dL — AB (ref 65–99)
POTASSIUM: 3.5 mmol/L (ref 3.5–5.1)
Sodium: 140 mmol/L (ref 135–145)
Total Bilirubin: 0.2 mg/dL — ABNORMAL LOW (ref 0.3–1.2)
Total Protein: 6.9 g/dL (ref 6.5–8.1)

## 2015-06-12 LAB — CBC WITH DIFFERENTIAL/PLATELET
Basophils Absolute: 0 10*3/uL (ref 0.0–0.1)
Basophils Relative: 0 % (ref 0–1)
Eosinophils Absolute: 0 10*3/uL (ref 0.0–0.7)
Eosinophils Relative: 0 % (ref 0–5)
HCT: 30.1 % — ABNORMAL LOW (ref 36.0–46.0)
Hemoglobin: 9.7 g/dL — ABNORMAL LOW (ref 12.0–15.0)
Lymphocytes Relative: 8 % — ABNORMAL LOW (ref 12–46)
Lymphs Abs: 1 10*3/uL (ref 0.7–4.0)
MCH: 26.7 pg (ref 26.0–34.0)
MCHC: 32.2 g/dL (ref 30.0–36.0)
MCV: 82.9 fL (ref 78.0–100.0)
Monocytes Absolute: 0.1 10*3/uL (ref 0.1–1.0)
Monocytes Relative: 1 % — ABNORMAL LOW (ref 3–12)
NEUTROS PCT: 91 % — AB (ref 43–77)
Neutro Abs: 10.5 10*3/uL — ABNORMAL HIGH (ref 1.7–7.7)
Platelets: 272 10*3/uL (ref 150–400)
RBC: 3.63 MIL/uL — AB (ref 3.87–5.11)
RDW: 15.2 % (ref 11.5–15.5)
WBC: 11.6 10*3/uL — ABNORMAL HIGH (ref 4.0–10.5)

## 2015-06-12 LAB — TROPONIN I
TROPONIN I: 0.08 ng/mL — AB (ref <0.031)
Troponin I: 0.08 ng/mL — ABNORMAL HIGH (ref <0.031)
Troponin I: 0.06 ng/mL — ABNORMAL HIGH (ref <0.031)

## 2015-06-12 LAB — GLUCOSE, CAPILLARY
GLUCOSE-CAPILLARY: 211 mg/dL — AB (ref 65–99)
Glucose-Capillary: 194 mg/dL — ABNORMAL HIGH (ref 65–99)
Glucose-Capillary: 378 mg/dL — ABNORMAL HIGH (ref 65–99)
Glucose-Capillary: 142 mg/dL — ABNORMAL HIGH (ref 65–99)
Glucose-Capillary: 158 mg/dL — ABNORMAL HIGH (ref 65–99)

## 2015-06-12 LAB — D-DIMER, QUANTITATIVE (NOT AT ARMC): D DIMER QUANT: 2.06 ug{FEU}/mL — AB (ref 0.00–0.48)

## 2015-06-12 LAB — BRAIN NATRIURETIC PEPTIDE: B Natriuretic Peptide: 51.1 pg/mL (ref 0.0–100.0)

## 2015-06-12 LAB — LACTIC ACID, PLASMA: Lactic Acid, Venous: 3.6 mmol/L (ref 0.5–2.0)

## 2015-06-12 LAB — HEPARIN LEVEL (UNFRACTIONATED)
Heparin Unfractionated: 0.44 IU/mL (ref 0.30–0.70)
Heparin Unfractionated: 0.43 IU/mL (ref 0.30–0.70)

## 2015-06-12 LAB — MRSA PCR SCREENING: MRSA by PCR: NEGATIVE

## 2015-06-12 MED ORDER — VITAMIN D (ERGOCALCIFEROL) 1.25 MG (50000 UNIT) PO CAPS
50000.0000 [IU] | ORAL_CAPSULE | ORAL | Status: DC
  Administered 2015-06-13: 50000 [IU] via ORAL
  Filled 2015-06-12: qty 1

## 2015-06-12 MED ORDER — PANTOPRAZOLE SODIUM 40 MG PO TBEC
40.0000 mg | DELAYED_RELEASE_TABLET | Freq: Every day | ORAL | Status: DC
  Administered 2015-06-12 – 2015-06-14 (×3): 40 mg via ORAL
  Filled 2015-06-12 (×3): qty 1

## 2015-06-12 MED ORDER — POTASSIUM CHLORIDE CRYS ER 20 MEQ PO TBCR
40.0000 meq | EXTENDED_RELEASE_TABLET | Freq: Every day | ORAL | Status: DC
  Administered 2015-06-12 – 2015-06-14 (×3): 40 meq via ORAL
  Filled 2015-06-12 (×4): qty 2

## 2015-06-12 MED ORDER — POTASSIUM CHLORIDE CRYS ER 20 MEQ PO TBCR
20.0000 meq | EXTENDED_RELEASE_TABLET | Freq: Once | ORAL | Status: AC
  Administered 2015-06-12: 20 meq via ORAL
  Filled 2015-06-12: qty 1

## 2015-06-12 MED ORDER — ASPIRIN EC 325 MG PO TBEC
325.0000 mg | DELAYED_RELEASE_TABLET | Freq: Every day | ORAL | Status: DC
  Administered 2015-06-12 – 2015-06-14 (×3): 325 mg via ORAL
  Filled 2015-06-12 (×3): qty 1

## 2015-06-12 MED ORDER — TIZANIDINE HCL 4 MG PO TABS
4.0000 mg | ORAL_TABLET | Freq: Four times a day (QID) | ORAL | Status: DC | PRN
  Filled 2015-06-12 (×2): qty 1

## 2015-06-12 MED ORDER — ASPIRIN 325 MG PO TABS
325.0000 mg | ORAL_TABLET | Freq: Once | ORAL | Status: AC
  Administered 2015-06-12: 325 mg via ORAL
  Filled 2015-06-12: qty 1

## 2015-06-12 MED ORDER — ALBUTEROL SULFATE (2.5 MG/3ML) 0.083% IN NEBU: 2.5000 mg | INHALATION_SOLUTION | RESPIRATORY_TRACT | Status: DC

## 2015-06-12 MED ORDER — ONDANSETRON HCL 4 MG PO TABS: 4.0000 mg | ORAL_TABLET | Freq: Four times a day (QID) | ORAL | Status: DC | PRN

## 2015-06-12 MED ORDER — GABAPENTIN 100 MG PO CAPS
100.0000 mg | ORAL_CAPSULE | Freq: Three times a day (TID) | ORAL | Status: DC
  Administered 2015-06-12 – 2015-06-14 (×7): 100 mg via ORAL
  Filled 2015-06-12 (×9): qty 1

## 2015-06-12 MED ORDER — BUDESONIDE 0.25 MG/2ML IN SUSP
0.2500 mg | Freq: Two times a day (BID) | RESPIRATORY_TRACT | Status: DC
  Administered 2015-06-12: 0.25 mg via RESPIRATORY_TRACT
  Filled 2015-06-12 (×4): qty 2

## 2015-06-12 MED ORDER — ALBUTEROL SULFATE (2.5 MG/3ML) 0.083% IN NEBU: 2.5000 mg | INHALATION_SOLUTION | RESPIRATORY_TRACT | Status: DC | PRN

## 2015-06-12 MED ORDER — INSULIN GLARGINE 100 UNIT/ML ~~LOC~~ SOLN
30.0000 [IU] | Freq: Every day | SUBCUTANEOUS | Status: DC
  Administered 2015-06-12 – 2015-06-14 (×3): 30 [IU] via SUBCUTANEOUS
  Filled 2015-06-12 (×3): qty 0.3

## 2015-06-12 MED ORDER — METOPROLOL SUCCINATE ER 25 MG PO TB24
25.0000 mg | ORAL_TABLET | Freq: Every day | ORAL | Status: DC
  Administered 2015-06-12: 25 mg via ORAL
  Filled 2015-06-12: qty 1

## 2015-06-12 MED ORDER — MORPHINE SULFATE 4 MG/ML IJ SOLN
4.0000 mg | Freq: Once | INTRAMUSCULAR | Status: AC
  Administered 2015-06-12: 4 mg via INTRAVENOUS
  Filled 2015-06-12: qty 1

## 2015-06-12 MED ORDER — SODIUM CHLORIDE 0.9 % IJ SOLN
3.0000 mL | Freq: Two times a day (BID) | INTRAMUSCULAR | Status: DC
  Administered 2015-06-12 – 2015-06-13 (×3): 3 mL via INTRAVENOUS

## 2015-06-12 MED ORDER — FOLIC ACID 1 MG PO TABS
1.0000 mg | ORAL_TABLET | Freq: Every day | ORAL | Status: DC
  Administered 2015-06-12 – 2015-06-14 (×3): 1 mg via ORAL
  Filled 2015-06-12 (×3): qty 1

## 2015-06-12 MED ORDER — FUROSEMIDE 10 MG/ML IJ SOLN
40.0000 mg | Freq: Once | INTRAMUSCULAR | Status: AC
  Administered 2015-06-12: 40 mg via INTRAVENOUS
  Filled 2015-06-12: qty 4

## 2015-06-12 MED ORDER — CETYLPYRIDINIUM CHLORIDE 0.05 % MT LIQD
7.0000 mL | Freq: Two times a day (BID) | OROMUCOSAL | Status: DC
  Administered 2015-06-12 – 2015-06-14 (×4): 7 mL via OROMUCOSAL

## 2015-06-12 MED ORDER — DIAZEPAM 5 MG PO TABS
5.0000 mg | ORAL_TABLET | Freq: Two times a day (BID) | ORAL | Status: DC | PRN
Start: 2015-06-12 — End: 2015-06-14
  Administered 2015-06-12 – 2015-06-13 (×4): 5 mg via ORAL
  Filled 2015-06-12 (×4): qty 1

## 2015-06-12 MED ORDER — ALLOPURINOL 300 MG PO TABS
300.0000 mg | ORAL_TABLET | Freq: Every day | ORAL | Status: DC
  Administered 2015-06-12 – 2015-06-14 (×3): 300 mg via ORAL
  Filled 2015-06-12 (×3): qty 1

## 2015-06-12 MED ORDER — MORPHINE SULFATE 4 MG/ML IJ SOLN: 4.0000 mg | Freq: Once | INTRAMUSCULAR | Status: DC

## 2015-06-12 MED ORDER — AMLODIPINE BESYLATE 5 MG PO TABS
5.0000 mg | ORAL_TABLET | Freq: Every day | ORAL | Status: DC
  Administered 2015-06-12 – 2015-06-14 (×3): 5 mg via ORAL
  Filled 2015-06-12 (×3): qty 1

## 2015-06-12 MED ORDER — HEPARIN (PORCINE) IN NACL 100-0.45 UNIT/ML-% IJ SOLN
1000.0000 [IU]/h | INTRAMUSCULAR | Status: DC
  Administered 2015-06-12: 850 [IU]/h via INTRAVENOUS
  Administered 2015-06-13: 1000 [IU]/h via INTRAVENOUS
  Filled 2015-06-12 (×2): qty 250

## 2015-06-12 MED ORDER — METOPROLOL TARTRATE 1 MG/ML IV SOLN
5.0000 mg | Freq: Once | INTRAVENOUS | Status: AC
  Administered 2015-06-12: 5 mg via INTRAVENOUS
  Filled 2015-06-12: qty 5

## 2015-06-12 MED ORDER — TECHNETIUM TC 99M DIETHYLENETRIAME-PENTAACETIC ACID: 40.0000 | Freq: Once | INTRAVENOUS | Status: AC | PRN

## 2015-06-12 MED ORDER — ALBUTEROL SULFATE (2.5 MG/3ML) 0.083% IN NEBU
2.5000 mg | INHALATION_SOLUTION | Freq: Four times a day (QID) | RESPIRATORY_TRACT | Status: DC | PRN
  Administered 2015-06-13: 2.5 mg via RESPIRATORY_TRACT
  Filled 2015-06-12: qty 3

## 2015-06-12 MED ORDER — HEPARIN BOLUS VIA INFUSION
3000.0000 [IU] | Freq: Once | INTRAVENOUS | Status: AC
  Administered 2015-06-12: 3000 [IU] via INTRAVENOUS
  Filled 2015-06-12: qty 3000

## 2015-06-12 MED ORDER — LEVETIRACETAM 500 MG PO TABS
500.0000 mg | ORAL_TABLET | Freq: Two times a day (BID) | ORAL | Status: DC
  Administered 2015-06-12 – 2015-06-14 (×5): 500 mg via ORAL
  Filled 2015-06-12 (×6): qty 1

## 2015-06-12 MED ORDER — ACETAMINOPHEN 650 MG RE SUPP: 650.0000 mg | Freq: Four times a day (QID) | RECTAL | Status: DC | PRN

## 2015-06-12 MED ORDER — OXYMORPHONE HCL ER 10 MG PO T12A
40.0000 mg | EXTENDED_RELEASE_TABLET | Freq: Two times a day (BID) | ORAL | Status: DC
  Administered 2015-06-12 – 2015-06-14 (×5): 40 mg via ORAL
  Filled 2015-06-12 (×5): qty 4

## 2015-06-12 MED ORDER — PREDNISONE 20 MG PO TABS
20.0000 mg | ORAL_TABLET | Freq: Every day | ORAL | Status: DC
  Administered 2015-06-13 – 2015-06-14 (×2): 20 mg via ORAL
  Filled 2015-06-12 (×4): qty 1

## 2015-06-12 MED ORDER — PREDNISONE 5 MG PO TABS
15.0000 mg | ORAL_TABLET | Freq: Every day | ORAL | Status: DC
  Administered 2015-06-12: 15 mg via ORAL
  Filled 2015-06-12 (×3): qty 1

## 2015-06-12 MED ORDER — TRIAMTERENE-HCTZ 37.5-25 MG PO TABS
1.0000 | ORAL_TABLET | Freq: Every day | ORAL | Status: DC
  Administered 2015-06-12: 1 via ORAL
  Filled 2015-06-12: qty 1

## 2015-06-12 MED ORDER — INSULIN ASPART 100 UNIT/ML ~~LOC~~ SOLN
0.0000 [IU] | Freq: Three times a day (TID) | SUBCUTANEOUS | Status: DC
  Administered 2015-06-12: 3 [IU] via SUBCUTANEOUS
  Administered 2015-06-12: 1 [IU] via SUBCUTANEOUS
  Administered 2015-06-12: 9 [IU] via SUBCUTANEOUS
  Administered 2015-06-13: 7 [IU] via SUBCUTANEOUS
  Administered 2015-06-13 (×2): 1 [IU] via SUBCUTANEOUS
  Administered 2015-06-14: 3 [IU] via SUBCUTANEOUS

## 2015-06-12 MED ORDER — MOMETASONE FURO-FORMOTEROL FUM 100-5 MCG/ACT IN AERO
2.0000 | INHALATION_SPRAY | Freq: Two times a day (BID) | RESPIRATORY_TRACT | Status: DC
  Administered 2015-06-12 – 2015-06-14 (×4): 2 via RESPIRATORY_TRACT
  Filled 2015-06-12: qty 8.8

## 2015-06-12 MED ORDER — TECHNETIUM TO 99M ALBUMIN AGGREGATED
6.0000 | Freq: Once | INTRAVENOUS | Status: AC | PRN
  Administered 2015-06-12: 6 via INTRAVENOUS

## 2015-06-12 MED ORDER — ONDANSETRON HCL 4 MG/2ML IJ SOLN: 4.0000 mg | Freq: Four times a day (QID) | INTRAMUSCULAR | Status: DC | PRN

## 2015-06-12 MED ORDER — ACETAMINOPHEN 325 MG PO TABS: 650.0000 mg | ORAL_TABLET | Freq: Four times a day (QID) | ORAL | Status: DC | PRN

## 2015-06-12 MED ORDER — OXYCODONE HCL 5 MG PO TABS
20.0000 mg | ORAL_TABLET | ORAL | Status: DC
  Administered 2015-06-12 – 2015-06-14 (×12): 20 mg via ORAL
  Filled 2015-06-12 (×12): qty 4

## 2015-06-12 NOTE — Progress Notes (Signed)
06/12/15  Pharmacy-  Heparin 1645  Heparin level 0.43 on 850 units/hr  A/P:  58yo female on Heparin for ACS.  Heparin level therapeutic on 850 units/hr.  No bleeding noted.  Will continue current rate and f/u in AM.  Marisue Humble, PharmD Clinical Pharmacist [facility]

## 2015-06-12 NOTE — ED Provider Notes (Addendum)
CSN: [id number]     Arrival date & time    History   First MD Initiated Contact with Patient 06/11/15 2244     Chief Complaint  Patient presents with  . Respiratory Distress     (Consider location/radiation/quality/duration/timing/severity/associated sxs/prior Treatment) HPI Patient developed shortness of breath today. She reports that she had been outside in the afternoon and felt that it was increasingly difficult to breathe. She also reports that she's had a tightness sensation in the center chest with pressure. The patient thought her symptoms were due to her asthma. This increased significantly and she began to found it very difficult to breathe. Patient denies she's had a fever. She reports as been a small amount of cough with some mucus. Patient poor she has noted some increased swelling in her feet more recently. EMS found the patient to be in significant respiratory distress. They initiated treatment with DuoNeb's, Solu-Medrol, magnesium and IM epinephrine. They placed the patient on BiPAP. Past Medical History  Diagnosis Date  . Arthritis   . Asthma   . Hypertension   . Constipation   . GERD (gastroesophageal reflux disease)   . Diabetes mellitus without complication   . Chronic pain   . Seizures     last seizure March 2015   Past Surgical History  Procedure Laterality Date  . Back surgery    . Cholecystectomy    . Appendectomy     Family History  Problem Relation Age of Onset  . Kidney failure Mother   . Hypertension Sister   . Hypertension Brother    History  Substance Use Topics  . Smoking status: Former Games developer  . Smokeless tobacco: Never Used  . Alcohol Use: Yes     Comment: admits to 2 drinks/week   OB History    No data available     Review of Systems  10 Systems reviewed and are negative for acute change except as noted in the HPI.   Allergies  Ivp dye  Home Medications   Prior to Admission medications   Medication Sig Start Date End Date  Taking? Authorizing Provider  albuterol (PROVENTIL HFA;VENTOLIN HFA) 108 (90 BASE) MCG/ACT inhaler Inhale 2 puffs into the lungs every 6 (six) hours as needed for wheezing or shortness of breath.    Historical Provider, MD  amLODipine (NORVASC) 5 MG tablet Take 5 mg by mouth daily.    Historical Provider, MD  diazepam (VALIUM) 5 MG tablet Take 1 tablet (5 mg total) by mouth every 12 (twelve) hours as needed for muscle spasms. 05/25/15   Kathrynn Speed, PA-C  dicloxacillin (DYNAPEN) 500 MG capsule Take 1 capsule (500 mg total) by mouth 3 (three) times daily. 01/27/15   Calvert Cantor, MD  esomeprazole (NEXIUM) 40 MG capsule Take 40 mg by mouth daily at 12 noon.    Historical Provider, MD  Fluticasone-Salmeterol (ADVAIR) 250-50 MCG/DOSE AEPB Inhale 1 puff into the lungs 2 (two) times daily.    Historical Provider, MD  folic acid (FOLVITE) 1 MG tablet Take 1 mg by mouth daily.    Historical Provider, MD  levETIRAcetam (KEPPRA) 500 MG tablet Take 1 tablet (500 mg total) by mouth 2 (two) times daily. 01/27/15   Calvert Cantor, MD  lipase/protease/amylase (CREON) 12000 UNITS CPEP capsule Take by mouth.    Historical Provider, MD  metoprolol succinate (TOPROL-XL) 25 MG 24 hr tablet Take 25 mg by mouth daily.    Historical Provider, MD  OxyCODONE HCl 7.5 MG TABA Take 1  tablet by mouth every 6 (six) hours as needed. 04/07/15   Geoffery Lyons, MD  oxymorphone (OPANA ER) 40 MG 12 hr tablet Take 40 mg by mouth every 12 (twelve) hours.    Historical Provider, MD  potassium chloride SA (K-DUR,KLOR-CON) 20 MEQ tablet Take 2 tablets (40 mEq total) by mouth daily. 01/27/15   Calvert Cantor, MD  predniSONE (DELTASONE) 5 MG tablet Take 3 tablets (15 mg total) by mouth daily with breakfast. 01/27/15   Calvert Cantor, MD  tiZANidine (ZANAFLEX) 4 MG tablet Take 0.5 tablets (2 mg total) by mouth 2 (two) times daily as needed for muscle spasms. 01/27/15   Calvert Cantor, MD  triamterene-hydrochlorothiazide (MAXZIDE-25) 37.5-25 MG per tablet Take 1  tablet by mouth daily.    Historical Provider, MD  Vitamin D, Ergocalciferol, (DRISDOL) 50000 UNITS CAPS capsule Take 1 capsule (50,000 Units total) by mouth every 7 (seven) days. Sunday 01/27/15   Calvert Cantor, MD  VITAMIN E PO Take 1 capsule by mouth daily.    Historical Provider, MD   BP 111/68 mmHg  Pulse 114  Resp 18  Ht 5\' 4"  (1.626 m)  Wt 136 lb (61.689 kg)  BMI 23.33 kg/m2  SpO2 98% Physical Exam  Constitutional: She is oriented to person, place, and time.  Patient arrives on BiPAP very anxious and agitated. She is warm and dry. Moderate to severe appearance of respiratory distress.  HENT:  Head: Normocephalic and atraumatic.  Nose: Nose normal.  Eyes: EOM are normal.  Cardiovascular: Intact distal pulses.   Tachycardia. No gross rub murmur gallop.  Pulmonary/Chest:  Increased work of breathing moderate to severe. Patient is exhibiting poor air flow with fine expiratory wheeze.  Abdominal: Soft. There is no tenderness.  Musculoskeletal: She exhibits tenderness.  1+ edema bilateral feet. Calves are soft nontender.  Neurological: She is alert and oriented to person, place, and time. Coordination normal.  Skin: Skin is warm and dry.  Psychiatric:  Patient is extremely anxious.    ED Course  Procedures (including critical care time) Labs Review Labs Reviewed  CBC - Abnormal; Notable for the following:    WBC 16.3 (*)    Hemoglobin 10.3 (*)    HCT 32.0 (*)    All other components within normal limits  COMPREHENSIVE METABOLIC PANEL - Abnormal; Notable for the following:    Potassium 3.4 (*)    CO2 20 (*)    Glucose, Bld 147 (*)    Creatinine, Ser 1.10 (*)    Total Bilirubin 0.2 (*)    GFR calc non Af Amer 55 (*)    All other components within normal limits  TROPONIN I - Abnormal; Notable for the following:    Troponin I 0.09 (*)    All other components within normal limits  MAGNESIUM - Abnormal; Notable for the following:    Magnesium 2.9 (*)    All other  components within normal limits  I-STAT ARTERIAL BLOOD GAS, ED - Abnormal; Notable for the following:    pCO2 arterial 32.2 (*)    pO2, Arterial 239.0 (*)    Acid-base deficit 4.0 (*)    All other components within normal limits  BRAIN NATRIURETIC PEPTIDE  BLOOD GAS, ARTERIAL  I-STAT TROPOININ, ED    Imaging Review Dg Chest Port 1 View  06/11/2015   CLINICAL DATA:  Acute onset of respiratory distress earlier this evening. Patient on BiPAP.  EXAM: PORTABLE CHEST - 1 VIEW  COMPARISON:  CT chest 01/25/2015.  FINDINGS: Suboptimal inspiration accounts  for crowded bronchovascular markings diffusely and atelectasis in the bases, and accentuates the cardiac silhouette. Taking this into account, cardiac silhouette mildly enlarged for AP portable technique, unchanged. Pleuroparenchymal scarring in the right upper lobe adjacent to fractures of the right lateral 3rd and 4th ribs. Lungs otherwise clear. Pulmonary vascularity normal.  IMPRESSION: Suboptimal inspiration accounts for mild bibasilar atelectasis. No acute cardiopulmonary disease otherwise. Stable mild cardiomegaly without pulmonary edema.   Electronically Signed   By: Hulan Saas M.D.   On: 06/11/2015 23:20    ekg: Sinus tachycardia 149 PR 403 are 62 DC 437. No acute ST elevation. Nonspecific lateral T-wave flattening. Baseline wandering with artifact  CRITICAL CARE Performed by: Arby Barrette   Total critical care time: 45  Critical care time was exclusive of separately billable procedures and treating other patients.  Critical care was necessary to treat or prevent imminent or life-threatening deterioration.  Critical care was time spent personally by me on the following activities: development of treatment plan with patient and/or surrogate as well as nursing, discussions with consultants, evaluation of patient's response to treatment, examination of patient, obtaining history from patient or surrogate, ordering and performing  treatments and interventions, ordering and review of laboratory studies, ordering and review of radiographic studies, pulse oximetry and re-evaluation of patient's condition. MDM   Final diagnoses:  Asthma exacerbation  Chest pain, unspecified chest pain type  ACS (acute coronary syndrome)   Patients, EMS to be in severe respiratory distress. She was managed with Solu-Medrol, epinephrine, magnesium and DuoNeb's. Upon arrival the patient was on BiPAP and continued to have respiratory distress. She did improve however significantly and BiPAP was able to be discontinued for nasal cannula oxygen she does describe central chest tightness and at this point time will be placed in observation for rule out of MI and asthma exacerbation.    Arby Barrette, MD 06/12/15 3734  Arby Barrette, MD 06/12/15 [id number]

## 2015-06-12 NOTE — Progress Notes (Signed)
ANTICOAGULATION CONSULT NOTE - Follow Up Consult  Pharmacy Consult for Heparin Indication: chest pain/ACS  Allergies  Allergen Reactions  . Ivp Dye [Iodinated Diagnostic Agents] Itching    Patient Measurements: Height: 5' (152.4 cm) Weight: 150 lb 2.1 oz (68.1 kg) IBW/kg (Calculated) : 45.5 Heparin Dosing Weight:   Vital Signs: Temp: 97.5 F (36.4 C) (07/23 0800) Temp Source: Oral (07/23 0800) BP: 138/87 mmHg (07/23 0255) Pulse Rate: 106 (07/23 0456)  Labs:  Recent Labs  06/11/15 2255 06/12/15 0510 06/12/15 0900  HGB 10.3* 9.7*  --   HCT 32.0* 30.1*  --   PLT 318 272  --   HEPARINUNFRC  --   --  0.44  CREATININE 1.10* 0.88  --   TROPONINI 0.09* 0.08*  --     Estimated Creatinine Clearance: 61.4 mL/min (by C-G formula based on Cr of 0.88).   Medications:  Scheduled:  . allopurinol  300 mg Oral Daily  . amLODipine  5 mg Oral Daily  . antiseptic oral rinse  7 mL Mouth Rinse BID  . aspirin EC  325 mg Oral Daily  . folic acid  1 mg Oral Daily  . furosemide  40 mg Intravenous Once  . insulin aspart  0-9 Units Subcutaneous TID WC  . levETIRAcetam  500 mg Oral BID  . LORazepam      . metoprolol succinate  25 mg Oral Daily  . mometasone-formoterol  2 puff Inhalation BID  . oxyCODONE  20 mg Oral Q4H while awake  . oxymorphone  40 mg Oral Q12H  . pantoprazole  40 mg Oral Daily  . potassium chloride SA  40 mEq Oral Daily  . predniSONE  15 mg Oral Q breakfast  . sodium chloride  3 mL Intravenous Q12H  . triamterene-hydrochlorothiazide  1 tablet Oral Daily  . [START ON 06/13/2015] Vitamin D (Ergocalciferol)  50,000 Units Oral Q Sun    Assessment: 59yo female admitted with respiratory distress and chest pain.  This AM with mildly (+)Trop x 2 and plans for TTE + cath vs stress test.  Heparin level is therapeutic on current rate of 850 units/hr.  Hg 9.7 and pltc wnl.  Noted pt on Toprol and paged DrAbrol regarding cardiology recommendation to hold in setting of  possible severe asthma attack- to continue Toprol as pt is not wheezing.  Goal of Therapy:  Heparin level 0.3-0.7 units/ml Monitor platelets by anticoagulation protocol: Yes   Plan:  Continue Heparin 850 units/hr Repeat heparin level in 6hr to verify therapeutic x 2 Daily HL, CBC  Marisue Humble, PharmD Clinical Pharmacist [facility]

## 2015-06-12 NOTE — Progress Notes (Addendum)
Patient seen and examined  Patient has had multiple falls recently and has not been very active, back surgery 3 years ago  Complains of shortness of breath limiting physical activity every time she tries to ambulate  D-dimer elevated, doubt that the patient is having asthma exacerbation  Check VQ scan, LE venous Doppler to rule out PE and DVT  IV Lasix due to bilateral lower extremity edema and shortness of breath to see for symptoms improve  2-D echo to rule out wall motion abnormalities, CHF, troponin unchanged 2  Continue aspirin and Lipitor, hold metoprolol   Start Gabapentin for peripheral neuropathy

## 2015-06-12 NOTE — Progress Notes (Signed)
ANTICOAGULATION CONSULT NOTE - Initial Consult  Pharmacy Consult for Heparin  Indication: chest pain/ACS  Allergies  Allergen Reactions  . Ivp Dye [Iodinated Diagnostic Agents] Itching    Patient Measurements: Height: 5\' 4"  (162.6 cm) Weight: 136 lb (61.689 kg) IBW/kg (Calculated) : 54.7  Vital Signs: BP: 111/68 mmHg (07/23 0100) Pulse Rate: 114 (07/23 0100)  Labs:  Recent Labs  06/11/15 2255  HGB 10.3*  HCT 32.0*  PLT 318  CREATININE 1.10*  TROPONINI 0.09*    Estimated Creatinine Clearance: 49.3 mL/min (by C-G formula based on Cr of 1.1).   Medical History: Past Medical History  Diagnosis Date  . Arthritis   . Asthma   . Hypertension   . Constipation   . GERD (gastroesophageal reflux disease)   . Diabetes mellitus without complication   . Chronic pain   . Seizures     last seizure March 2015    Assessment: 59 y/o F here with respiratory distress, some central chest tightness, mildly elevated troponin x 1, Hgb 10.3, renal function ok, other labs as above.   Goal of Therapy:  Heparin level 0.3-0.7 units/ml Monitor platelets by anticoagulation protocol: Yes   Plan:  -Heparin 3000 units BOLUS -Start heparin drip at 850 units/hr -0900 HL -Daily CBC/HL -Monitor for bleeding  61 06/12/2015,1:22 AM

## 2015-06-12 NOTE — H&P (Signed)
[facility] History and Physical  Micha Erck MWN:[id number] DOB: February 18, 1956 DOA: 06/11/2015  Referring physician: ER physician. PCP: [name], MD  Specialists: None.  Chief Complaint: Shortness of breath and chest pain.  HPI: Amber [name] is a 59 y.o. female with history of asthma, hypertension, hyperglycemia, nonspecific arthritis and chronic pain and seizures was brought to the ER after patient was found to have sudden onset of severe shortness of breath. Patient was initially placed on BiPAP in the ER. At the field patient was given epinephrine injection intramuscular. Patient was placed on nebulizer and steroids. After patient's shortness of breath improved patient was weaned off BiPAP. On my exam patient is not wheezing and has no chest pain at this time. Patient states over the last 2 weeks patient has been gradually worsening shortness of breath with left-sided burning type of chest pain. Patient chest pain increases on exertion. Denies any nausea vomiting or abdominal pain productive cough or fever chills. Chest x-ray was unremarkable. Troponin is mildly elevated and cardiology has been consulted. Labs also revealed metabolic acidosis.   Review of Systems: As presented in the history of presenting illness, rest negative.  Past Medical History  Diagnosis Date  . Arthritis   . Asthma   . Hypertension   . Constipation   . GERD (gastroesophageal reflux disease)   . Diabetes mellitus without complication   . Chronic pain   . Seizures     last seizure March 2015   Past Surgical History  Procedure Laterality Date  . Back surgery    . Cholecystectomy    . Appendectomy     Social History:  reports that she has quit smoking. She has never used smokeless tobacco. She reports that she drinks alcohol. She reports that she does not use illicit drugs. Where does patient live home. Can patient participate in ADLs? Yes.  Allergies  Allergen Reactions  . Ivp Dye [Iodinated  Diagnostic Agents] Itching    Family History:  Family History  Problem Relation Age of Onset  . Kidney failure Mother   . Hypertension Sister   . Hypertension Brother       Prior to Admission medications   Medication Sig Start Date End Date Taking? Authorizing Provider  albuterol (PROVENTIL HFA;VENTOLIN HFA) 108 (90 BASE) MCG/ACT inhaler Inhale 2 puffs into the lungs every 6 (six) hours as needed for wheezing or shortness of breath.   Yes Historical Provider, MD  allopurinol (ZYLOPRIM) 300 MG tablet Take 300 mg by mouth daily.   Yes Historical Provider, MD  amLODipine (NORVASC) 5 MG tablet Take 5 mg by mouth daily.   Yes Historical Provider, MD  esomeprazole (NEXIUM) 40 MG capsule Take 40 mg by mouth daily at 12 noon.   Yes Historical Provider, MD  Fluticasone-Salmeterol (ADVAIR) 250-50 MCG/DOSE AEPB Inhale 1 puff into the lungs 2 (two) times daily.   Yes Historical Provider, MD  folic acid (FOLVITE) 1 MG tablet Take 1 mg by mouth daily.   Yes Historical Provider, MD  levETIRAcetam (KEPPRA) 500 MG tablet Take 1 tablet (500 mg total) by mouth 2 (two) times daily. 01/27/15  Yes Calvert Cantor, MD  metFORMIN (GLUCOPHAGE-XR) 500 MG 24 hr tablet Take 500 mg by mouth daily with breakfast.   Yes Historical Provider, MD  metoprolol succinate (TOPROL-XL) 25 MG 24 hr tablet Take 25 mg by mouth daily.   Yes Historical Provider, MD  Oxycodone HCl 20 MG TABS Take 20 mg by mouth every 4 (four) hours.   Yes Historical  Provider, MD  oxymorphone (OPANA ER) 40 MG 12 hr tablet Take 40 mg by mouth every 12 (twelve) hours.   Yes Historical Provider, MD  potassium chloride SA (K-DUR,KLOR-CON) 20 MEQ tablet Take 2 tablets (40 mEq total) by mouth daily. 01/27/15  Yes Calvert Cantor, MD  predniSONE (DELTASONE) 5 MG tablet Take 3 tablets (15 mg total) by mouth daily with breakfast. Patient taking differently: Take 15 mg by mouth 4 (four) times daily.  01/27/15  Yes Calvert Cantor, MD  tiZANidine (ZANAFLEX) 4 MG tablet Take 0.5  tablets (2 mg total) by mouth 2 (two) times daily as needed for muscle spasms. Patient taking differently: Take 4 mg by mouth every 6 (six) hours as needed for muscle spasms.  01/27/15  Yes Calvert Cantor, MD  triamterene-hydrochlorothiazide (MAXZIDE-25) 37.5-25 MG per tablet Take 1 tablet by mouth daily.   Yes Historical Provider, MD  Vitamin D, Ergocalciferol, (DRISDOL) 50000 UNITS CAPS capsule Take 1 capsule (50,000 Units total) by mouth every 7 (seven) days. Sunday 01/27/15  Yes Calvert Cantor, MD  VITAMIN E PO Take 1 capsule by mouth daily.   Yes Historical Provider, MD  diazepam (VALIUM) 5 MG tablet Take 1 tablet (5 mg total) by mouth every 12 (twelve) hours as needed for muscle spasms. Patient not taking: Reported on 06/12/2015 05/25/15   Kathrynn Speed, PA-C    Physical Exam: Filed Vitals:   06/12/15 0145 06/12/15 0200 06/12/15 0215 06/12/15 0255  BP: 130/85 121/78 103/70 138/87  Pulse: 105 100 103 107  Temp:    97.6 F (36.4 C)  TempSrc:    Oral  Resp: 19 32 17 20  Height:    5' (1.524 m)  Weight:    68.1 kg (150 lb 2.1 oz)  SpO2: 96% 99% 96% 100%     General:  Moderately built and nourished.  Eyes: Anicteric no pallor.  ENT: No discharge from the ears eyes nose and mouth.  Neck: No mass felt.  Cardiovascular: S1-S2 heard.  Respiratory: No rhonchi or crepitations.  Abdomen: Soft nontender bowel sounds present.  Skin: No rash.  Musculoskeletal: Mild lower extremity edema.  Psychiatric: Appears normal.  Neurologic: Alert awake oriented to time place and person. Moves all extremities.  Labs on Admission:  Basic Metabolic Panel:  Recent Labs Lab 06/11/15 2255  NA 138  K 3.4*  CL 105  CO2 20*  GLUCOSE 147*  BUN 12  CREATININE 1.10*  CALCIUM 9.4  MG 2.9*   Liver Function Tests:  Recent Labs Lab 06/11/15 2255  AST 30  ALT 17  ALKPHOS 103  BILITOT 0.2*  PROT 7.2  ALBUMIN 3.6   No results for input(s): LIPASE, AMYLASE in the last 168 hours. No results for  input(s): AMMONIA in the last 168 hours. CBC:  Recent Labs Lab 06/11/15 2255  WBC 16.3*  HGB 10.3*  HCT 32.0*  MCV 81.6  PLT 318   Cardiac Enzymes:  Recent Labs Lab 06/11/15 2255  TROPONINI 0.09*    BNP (last 3 results)  Recent Labs  06/11/15 2255  BNP 51.1    ProBNP (last 3 results) No results for input(s): PROBNP in the last 8760 hours.  CBG: No results for input(s): GLUCAP in the last 168 hours.  Radiological Exams on Admission: Dg Chest Port 1 View  06/11/2015   CLINICAL DATA:  Acute onset of respiratory distress earlier this evening. Patient on BiPAP.  EXAM: PORTABLE CHEST - 1 VIEW  COMPARISON:  CT chest 01/25/2015.  FINDINGS: Suboptimal  inspiration accounts for crowded bronchovascular markings diffusely and atelectasis in the bases, and accentuates the cardiac silhouette. Taking this into account, cardiac silhouette mildly enlarged for AP portable technique, unchanged. Pleuroparenchymal scarring in the right upper lobe adjacent to fractures of the right lateral 3rd and 4th ribs. Lungs otherwise clear. Pulmonary vascularity normal.  IMPRESSION: Suboptimal inspiration accounts for mild bibasilar atelectasis. No acute cardiopulmonary disease otherwise. Stable mild cardiomegaly without pulmonary edema.   Electronically Signed   By: Hulan Saas M.D.   On: 06/11/2015 23:20    EKG: Independently reviewed. Sinus tachycardia.  Assessment/Plan Principal Problem:   Acute respiratory failure with hypoxia Active Problems:   Microcytic anemia   Hypertension   History of seizures   ACS (acute coronary syndrome)   Asthma exacerbation   Acute respiratory failure   1. Acute respiratory failure with hypoxia - cause not clear may be due to asthma exacerbation. We will continue with albuterol and Pulmicort. Patient did receive epinephrine and steroids in the ER and at the field. At this time we'll continue patient's prednisone. 2. Chest pain and elevated troponin probably  from demand ischemia - appreciate cardiology consult. Patient is placed on heparin. Cardiology assessed and recommended also aspirin and Lipitor. No beta blocker since patient has asthma. 3. Metabolic acidosis - cause not clear. Check lactic acid levels. 4. History of seizures on Keppra. 5. Hypertension continue home medications. 6. Hyperglycemia last hemoglobin A1c checked last month was 4.9. Hyperglycemia probably from steroids. I have placed patient on statin scale coverage. 7. Acute renal failure probably from dehydration - recheck metabolic panel. 8. Chronic anemia follow CBC.  I have reviewed patient's old charts. I've discussed with on-call cardiologist. I personally reviewed patient's chest x-ray.   DVT Prophylaxis heparin infusion.  Code Status: Full code.  Family Communication: Discussed with patient.  Disposition Plan: Admit to inpatient.    Giara Mcgaughey N. [facility] Pager [phone].  If 7PM-7AM, please contact night-coverage [email or web address] Password [facility] 06/12/2015, 4:27 AM

## 2015-06-12 NOTE — Consult Note (Signed)
CARDIOLOGY CONSULT NOTE   Patient ID: Amber [name] MRN: [id number], DOB/AGE: [birth year]   Admit date: 06/11/2015 Date of Consult: 06/12/2015   Primary Physician: [name], MD Primary Cardiologist: None  Pt. Profile  59F female smoker with DM, HTN, asthma, GERD, seizure disorder, arthritis on chronic steroids, who presents with respiratory distress, chest burning, and positive troponin.  Problem List  Past Medical History  Diagnosis Date  . Arthritis   . Asthma   . Hypertension   . Constipation   . GERD (gastroesophageal reflux disease)   . Diabetes mellitus without complication   . Chronic pain   . Seizures     last seizure March 2015    Past Surgical History  Procedure Laterality Date  . Back surgery    . Cholecystectomy    . Appendectomy       Allergies  Allergies  Allergen Reactions  . Ivp Dye [Iodinated Diagnostic Agents] Itching    HPI   59F female smoker with DM, HTN, asthma, GERD, seizure disorder, arthritis on chronic steroids, who presents with respiratory distress, chest burning, and positive troponin.  This afternoon, Amber [name] developed SOB and felt very hot. She went inside her house at the recommendation of her son but continued to have these symptoms. She had associated lightheadeness, flashes of light, and a "burning" sensation in her left chest. She also notes some mild LE edema, weight fluctuations, leg burning. No cough of sputum production.  EMS was called due to significant dyspnea. EMS placed patient on bipap and gave DuoNeb's, Solu-Medrol, magnesium and IM epinephrine  On arrival to the ER she was tachycardic (147bpm), tachypneic (30), and hypertensive (154/105), saturating 99% on BiPAP. She appeared anxious. ECG demonstrate ST. NSSTTWC. Compared to 01/26/15, no significant change.  ER exam on arrival reportedly demonstrated "decreased breath sounds" but no mention of wheezing. Labs were notable for K 3.4, Cr 1.1, Mg 2.9, BNP 51, TnI 0.09 (FU  POC TnI 0.00), WBC 16.3, ABG 7.4/32/239. CXR demonstrated no acute cardiopulmonary disease otherwise but stable mild cardiomegaly without pulmonary edema.She was given nebulizeres, ASA, ativan, lopressor, and morphine, and started on IV UFH.  Inpatient Medications  . LORazepam        Family History Family History  Problem Relation Age of Onset  . Kidney failure Mother   . Hypertension Sister   . Hypertension Brother      Social History History   Social History  . Marital Status: Single    Spouse Name: N/A  . Number of Children: N/A  . Years of Education: N/A   Occupational History  . Not on file.   Social History Main Topics  . Smoking status: Former Games developer  . Smokeless tobacco: Never Used  . Alcohol Use: Yes     Comment: admits to 2 drinks/week  . Drug Use: No  . Sexual Activity: Not on file   Other Topics Concern  . Not on file   Social History Narrative     Review of Systems  General:  No chills, fever, night sweats or weight changes.  Cardiovascular:  See HPI Dermatological: No rash, lesions/masses Respiratory:  See HPI Urologic: No hematuria, dysuria Abdominal:   No nausea, vomiting, diarrhea, bright red blood per rectum, melena, or hematemesis Neurologic:  No visual changes, wkns, changes in mental status. All other systems reviewed and are otherwise negative except as noted above.  Physical Exam  Blood pressure 103/70, pulse 103, resp. rate 17, height 5\' 4"  (1.626 m), weight 61.689 kg (  136 lb), SpO2 96 %.  General: Pleasant, NAD Psych: Normal affect. Neuro: Alert and oriented X 3. Moves all extremities spontaneously. HEENT: Normal  Neck: Supple without bruits or JVD. Lungs:  Resp regular and unlabored, bibasilar crackles. Heart: tachy, regular, no s3, s4, or murmurs. Abdomen: Soft, non-tender, non-distended, BS + x 4.  Extremities: No clubbing, cyanosis or trace LE edema. DP/PT/Radials 2+ and equal bilaterally.  Labs   Recent Labs   06/11/15 2255  TROPONINI 0.09*   Lab Results  Component Value Date   WBC 16.3* 06/11/2015   HGB 10.3* 06/11/2015   HCT 32.0* 06/11/2015   MCV 81.6 06/11/2015   PLT 318 06/11/2015    Recent Labs Lab 06/11/15 2255  NA 138  K 3.4*  CL 105  CO2 20*  BUN 12  CREATININE 1.10*  CALCIUM 9.4  PROT 7.2  BILITOT 0.2*  ALKPHOS 103  ALT 17  AST 30  GLUCOSE 147*   No results found for: CHOL, HDL, LDLCALC, TRIG No results found for: DDIMER  Radiology/Studies  Dg Thoracic Spine 2 View  05/25/2015   CLINICAL DATA:  Fall. Thoracic spine pain diffusely. Surgery 2 years ago.  EXAM: THORACIC SPINE - 2-3 VIEWS  COMPARISON:  01/25/2015  FINDINGS: T5 corpectomy spacer observed extending up to involve part of the T4 vertebral body. Posterolateral rod and pedicle screw fixator noted, not appreciably changed from 01/25/2015.  Right upper lateral rib deformities, chronic and stable. T12 compression fracture, chronic, stable. Considerable cervical spondylosis noted with loss of vertebral body height at C3-4 and 4 mm anterolisthesis at C3-4. Questionable prevertebral soft tissue swelling in the cervical spine.  The upper vertebral bodies have indistinct cortical margins. There might be some superior endplate concavity at T3 which was not present on 01/25/2015, although this is not entirely certain. Questionable linear lucency along the left hemidiaphragm on the lateral projection, probably from atelectasis.  IMPRESSION: 1. Questionable superior endplate concavity at T3 could conceivably represent an interval fracture compared to 01/25/15. CT of the thoracic spine would provide better definition. 2. Cervical spondylosis with 4 mm of anterolisthesis at C3-4, this subluxation is age indeterminate. Consider dedicated imaging of the cervical spine. 3. Prior corpectomy at T5 with spanning posterolateral rod and pedicle screw fixation as before. 4. Faint linear opacity along one of the hemidiaphragms on the lateral view,  possibly the left, probably from atelectasis. 5. Old compression at T12.   Electronically Signed   By: Gaylyn Rong M.D.   On: 05/25/2015 21:21   Ct Cervical Spine Wo Contrast  05/25/2015   CLINICAL DATA:  Fall with neck pain and upper back pain. Initial encounter.  EXAM: CT CERVICAL SPINE WITHOUT CONTRAST  TECHNIQUE: Multidetector CT imaging of the cervical spine was performed without intravenous contrast. Multiplanar CT image reconstructions were also generated.  COMPARISON:  None.  FINDINGS: No evidence of acute fracture. No traumatic malalignment suspected. No prevertebral edema or gross cervical canal hematoma.  Incomplete ossification of the posterior arch of C1 with hypertrophy of the anterior arch. The rudimentary posterior arch of C1 is non segmented from the C2 posterior ring.  There is multilevel degenerative disc narrowing and endplate spurring. Facet arthropathy, greatest at C3-4 with 3 mm of anterolisthesis and bilateral foraminal stenosis. Posterior rod and pedicle screw fixation in the upper thoracic spine, without complication at the imaged levels. There is contemporaneous dedicated thoracic spine imaging.  IMPRESSION: 1. No acute finding. 2. Multilevel degenerative disc and facet disease. Facet arthropathy is greatest at  C3-4 where there is anterolisthesis and bilateral foraminal stenosis.   Electronically Signed   By: Marnee Spring M.D.   On: 05/25/2015 22:07   Ct Thoracic Spine Wo Contrast  05/25/2015   CLINICAL DATA:  Status post fall, with upper back pain. Initial encounter.  EXAM: CT THORACIC SPINE WITHOUT CONTRAST  TECHNIQUE: Multidetector CT imaging of the thoracic spine was performed without intravenous contrast administration. Multiplanar CT image reconstructions were also generated.  COMPARISON:  CT of the chest performed 01/25/2015  FINDINGS: There is no evidence of acute fracture or subluxation along the thoracic spine. The previously noted chronic compression deformity at  the lower thoracic spine turns out to be at vertebral body T11.  The patient is status post posterior thoracic spinal fusion extending from T1 through T8, with decompression noted at T3-T5, and associated prosthesis at T3-T5. No new compression deformities are seen along the thoracic spine. Multilevel disc space narrowing is noted along the lower cervical spine, with scattered small anterior and posterior disc osteophyte complexes. Minimal vacuum phenomenon is noted at multiple levels along the lower thoracic spine.  The visualized portions of the lungs are grossly clear, aside from minimal left basilar atelectasis. Mildly prominent extrapleural fat is noted bilaterally, with minimal pleural calcification noted near the right lung apex. The visualized portions of mediastinum are grossly unremarkable, aside from scattered coronary artery calcification.  The paraspinal musculature appears grossly intact.  IMPRESSION: 1. No evidence of acute fracture or subluxation along the thoracic spine. 2. Chronic compression deformity again noted at vertebral body T11. 3. Status post thoracic spinal fusion extending from T1-T8, with decompression at T3-T5, and associated prosthesis. 4. Minimal degenerative change along the lower cervical spine. 5. Minimal left basilar atelectasis noted. Minimal pleural calcification near the right lung apex. 6. Scattered coronary artery calcifications seen.   Electronically Signed   By: Roanna Raider M.D.   On: 05/25/2015 22:41   Dg Chest Port 1 View  06/11/2015   CLINICAL DATA:  Acute onset of respiratory distress earlier this evening. Patient on BiPAP.  EXAM: PORTABLE CHEST - 1 VIEW  COMPARISON:  CT chest 01/25/2015.  FINDINGS: Suboptimal inspiration accounts for crowded bronchovascular markings diffusely and atelectasis in the bases, and accentuates the cardiac silhouette. Taking this into account, cardiac silhouette mildly enlarged for AP portable technique, unchanged. Pleuroparenchymal  scarring in the right upper lobe adjacent to fractures of the right lateral 3rd and 4th ribs. Lungs otherwise clear. Pulmonary vascularity normal.  IMPRESSION: Suboptimal inspiration accounts for mild bibasilar atelectasis. No acute cardiopulmonary disease otherwise. Stable mild cardiomegaly without pulmonary edema.   Electronically Signed   By: Hulan Saas M.D.   On: 06/11/2015 23:20   Dg Knee Complete 4 Views Right  05/25/2015   CLINICAL DATA:  Status post fall, with right knee pain. Initial encounter.  EXAM: RIGHT KNEE - COMPLETE 4+ VIEW  COMPARISON:  None.  FINDINGS: There is no evidence of fracture or dislocation. Marginal osteophytes are seen arising at the medial and lateral compartments, with sclerotic change at the lateral compartment and associated mild cortical irregularity. The joint spaces are preserved. No significant degenerative change is seen; the patellofemoral joint is grossly unremarkable in appearance.  Trace knee joint fluid remains within normal limits. Diffuse vascular calcifications are seen.  IMPRESSION: 1. No evidence of fracture or dislocation. 2. 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 3. Diffuse vascular calcifications seen.   Electronically Signed   By:  Roanna Raider M.D.   On: 05/25/2015 21:13    ECG  7/23 @ 138am: ST. NSSTTWC. Compared to 01/26/15, no significant change.   ASSESSMENT AND PLAN  31F female smoker with DM, HTN, asthma, GERD, seizure disorder, arthritis on chronic steroids, who presents with respiratory distress, chest burning, and positive troponin. Her lung exam is remarkably normal aside from some bibasilar crackles (probably atelectasis). On [place] now. BNP normal. Exam c/w euvolemia except for very mild trace LE edema. It is surprising that whatever made her dyspneic improved so quickly. ECG without definite ischemic changes. TnI is mildly elevated in 1 of 2 draws.  Suspect that this is most likely demand ischemia in the setting of resp distress with epi administration leading to sustained sinus tachycardia in the 140s. Unclear if hypoxemia also played a role as no saturation in the field was clearly documented. However, she has significant CAD risk factors including HTN, DM, tobacco use, and chronic inflammatory arthritis.   - cycle troponins - OK to continue UFH  - start ASA - start atorva - Would hold metoprolol for the time being given what was reportedly a severe asthma exacerbation - TTE - Possible cath (vs stress) depending on biomarker trend  Signed, Glori Luis, MD 06/12/2015, 2:48 AM

## 2015-06-13 ENCOUNTER — Inpatient Hospital Stay (HOSPITAL_COMMUNITY): Payer: Medicaid Other

## 2015-06-13 DIAGNOSIS — M79609 Pain in unspecified limb: Secondary | ICD-10-CM

## 2015-06-13 DIAGNOSIS — R079 Chest pain, unspecified: Secondary | ICD-10-CM

## 2015-06-13 LAB — GLUCOSE, CAPILLARY
Glucose-Capillary: 145 mg/dL — ABNORMAL HIGH (ref 65–99)
Glucose-Capillary: 147 mg/dL — ABNORMAL HIGH (ref 65–99)
Glucose-Capillary: 311 mg/dL — ABNORMAL HIGH (ref 65–99)
Glucose-Capillary: 134 mg/dL — ABNORMAL HIGH (ref 65–99)

## 2015-06-13 LAB — HEPARIN LEVEL (UNFRACTIONATED): Heparin Unfractionated: 0.2 IU/mL — ABNORMAL LOW (ref 0.30–0.70)

## 2015-06-13 LAB — CBC
HEMATOCRIT: 27.9 % — AB (ref 36.0–46.0)
HEMOGLOBIN: 8.9 g/dL — AB (ref 12.0–15.0)
MCH: 25.9 pg — AB (ref 26.0–34.0)
MCHC: 31.9 g/dL (ref 30.0–36.0)
MCV: 81.1 fL (ref 78.0–100.0)
Platelets: 260 10*3/uL (ref 150–400)
RBC: 3.44 MIL/uL — ABNORMAL LOW (ref 3.87–5.11)
RDW: 14.9 % (ref 11.5–15.5)
WBC: 15.9 10*3/uL — ABNORMAL HIGH (ref 4.0–10.5)

## 2015-06-13 MED ORDER — POLYETHYLENE GLYCOL 3350 17 G PO PACK
17.0000 g | PACK | Freq: Two times a day (BID) | ORAL | Status: DC
  Filled 2015-06-13 (×3): qty 1

## 2015-06-13 MED ORDER — OXYCODONE HCL 5 MG PO TABS
5.0000 mg | ORAL_TABLET | Freq: Once | ORAL | Status: AC
  Administered 2015-06-13: 5 mg via ORAL
  Filled 2015-06-13: qty 1

## 2015-06-13 MED ORDER — DIAZEPAM 2 MG PO TABS
2.0000 mg | ORAL_TABLET | Freq: Every day | ORAL | Status: DC
  Administered 2015-06-13: 2 mg via ORAL
  Filled 2015-06-13: qty 1

## 2015-06-13 NOTE — Progress Notes (Signed)
SUBJECTIVE: Patient feels somewhat better but still has shortness of breath. The troponin rise is only slight. So far there has been no proof of CHF. The ventilation perfusion scan revealed no pulmonary emboli. 2-D echo has not been done yet.   Filed Vitals:   06/13/15 0328 06/13/15 0459 06/13/15 0826 06/13/15 0850  BP: 97/64  118/79   Pulse: 88     Temp: 98.4 F (36.9 C)  98.1 F (36.7 C)   TempSrc: Oral  Oral   Resp: 15  18   Height:      Weight:  149 lb 11.1 oz (67.9 kg)    SpO2: 100%  99% 100%     Intake/Output Summary (Last 24 hours) at 06/13/15 0953 Last data filed at 06/13/15 0826  Gross per 24 hour  Intake 1791.51 ml  Output   1900 ml  Net -108.49 ml    LABS: Basic Metabolic Panel:  Recent Labs  16/10/96 2255 06/12/15 0510  NA 138 140  K 3.4* 3.5  CL 105 109  CO2 20* 18*  GLUCOSE 147* 193*  BUN 12 11  CREATININE 1.10* 0.88  CALCIUM 9.4 8.6*  MG 2.9*  --    Liver Function Tests:  Recent Labs  06/11/15 2255 06/12/15 0510  AST 30 24  ALT 17 15  ALKPHOS 103 92  BILITOT 0.2* 0.2*  PROT 7.2 6.9  ALBUMIN 3.6 3.1*   No results for input(s): LIPASE, AMYLASE in the last 72 hours. CBC:  Recent Labs  06/12/15 0510 06/13/15 0245  WBC 11.6* 15.9*  NEUTROABS 10.5*  --   HGB 9.7* 8.9*  HCT 30.1* 27.9*  MCV 82.9 81.1  PLT 272 260   Cardiac Enzymes:  Recent Labs  06/12/15 0510 06/12/15 1040 06/12/15 1615  TROPONINI 0.08* 0.08* 0.06*   BNP: Invalid input(s): POCBNP D-Dimer:  Recent Labs  06/12/15 0900  DDIMER 2.06*   Hemoglobin A1C: No results for input(s): HGBA1C in the last 72 hours. Fasting Lipid Panel: No results for input(s): CHOL, HDL, LDLCALC, TRIG, CHOLHDL, LDLDIRECT in the last 72 hours. Thyroid Function Tests: No results for input(s): TSH, T4TOTAL, T3FREE, THYROIDAB in the last 72 hours.  Invalid input(s): FREET3  RADIOLOGY: Dg Thoracic Spine 2 View  05/25/2015   CLINICAL DATA:  Fall. Thoracic spine pain diffusely.  Surgery 2 years ago.  EXAM: THORACIC SPINE - 2-3 VIEWS  COMPARISON:  01/25/2015  FINDINGS: T5 corpectomy spacer observed extending up to involve part of the T4 vertebral body. Posterolateral rod and pedicle screw fixator noted, not appreciably changed from 01/25/2015.  Right upper lateral rib deformities, chronic and stable. T12 compression fracture, chronic, stable. Considerable cervical spondylosis noted with loss of vertebral body height at C3-4 and 4 mm anterolisthesis at C3-4. Questionable prevertebral soft tissue swelling in the cervical spine.  The upper vertebral bodies have indistinct cortical margins. There might be some superior endplate concavity at T3 which was not present on 01/25/2015, although this is not entirely certain. Questionable linear lucency along the left hemidiaphragm on the lateral projection, probably from atelectasis.  IMPRESSION: 1. Questionable superior endplate concavity at T3 could conceivably represent an interval fracture compared to 01/25/15. CT of the thoracic spine would provide better definition. 2. Cervical spondylosis with 4 mm of anterolisthesis at C3-4, this subluxation is age indeterminate. Consider dedicated imaging of the cervical spine. 3. Prior corpectomy at T5 with spanning posterolateral rod and pedicle screw fixation as before. 4. Faint linear opacity along one of the hemidiaphragms on  the lateral view, possibly the left, probably from atelectasis. 5. Old compression at T12.   Electronically Signed   By: Gaylyn Rong M.D.   On: 05/25/2015 21:21   Ct Cervical Spine Wo Contrast  05/25/2015   CLINICAL DATA:  Fall with neck pain and upper back pain. Initial encounter.  EXAM: CT CERVICAL SPINE WITHOUT CONTRAST  TECHNIQUE: Multidetector CT imaging of the cervical spine was performed without intravenous contrast. Multiplanar CT image reconstructions were also generated.  COMPARISON:  None.  FINDINGS: No evidence of acute fracture. No traumatic malalignment  suspected. No prevertebral edema or gross cervical canal hematoma.  Incomplete ossification of the posterior arch of C1 with hypertrophy of the anterior arch. The rudimentary posterior arch of C1 is non segmented from the C2 posterior ring.  There is multilevel degenerative disc narrowing and endplate spurring. Facet arthropathy, greatest at C3-4 with 3 mm of anterolisthesis and bilateral foraminal stenosis. Posterior rod and pedicle screw fixation in the upper thoracic spine, without complication at the imaged levels. There is contemporaneous dedicated thoracic spine imaging.  IMPRESSION: 1. No acute finding. 2. Multilevel degenerative disc and facet disease. Facet arthropathy is greatest at C3-4 where there is anterolisthesis and bilateral foraminal stenosis.   Electronically Signed   By: Marnee Spring M.D.   On: 05/25/2015 22:07   Ct Thoracic Spine Wo Contrast  05/25/2015   CLINICAL DATA:  Status post fall, with upper back pain. Initial encounter.  EXAM: CT THORACIC SPINE WITHOUT CONTRAST  TECHNIQUE: Multidetector CT imaging of the thoracic spine was performed without intravenous contrast administration. Multiplanar CT image reconstructions were also generated.  COMPARISON:  CT of the chest performed 01/25/2015  FINDINGS: There is no evidence of acute fracture or subluxation along the thoracic spine. The previously noted chronic compression deformity at the lower thoracic spine turns out to be at vertebral body T11.  The patient is status post posterior thoracic spinal fusion extending from T1 through T8, with decompression noted at T3-T5, and associated prosthesis at T3-T5. No new compression deformities are seen along the thoracic spine. Multilevel disc space narrowing is noted along the lower cervical spine, with scattered small anterior and posterior disc osteophyte complexes. Minimal vacuum phenomenon is noted at multiple levels along the lower thoracic spine.  The visualized portions of the lungs are  grossly clear, aside from minimal left basilar atelectasis. Mildly prominent extrapleural fat is noted bilaterally, with minimal pleural calcification noted near the right lung apex. The visualized portions of mediastinum are grossly unremarkable, aside from scattered coronary artery calcification.  The paraspinal musculature appears grossly intact.  IMPRESSION: 1. No evidence of acute fracture or subluxation along the thoracic spine. 2. Chronic compression deformity again noted at vertebral body T11. 3. Status post thoracic spinal fusion extending from T1-T8, with decompression at T3-T5, and associated prosthesis. 4. Minimal degenerative change along the lower cervical spine. 5. Minimal left basilar atelectasis noted. Minimal pleural calcification near the right lung apex. 6. Scattered coronary artery calcifications seen.   Electronically Signed   By: Roanna Raider M.D.   On: 05/25/2015 22:41   Nm Pulmonary Perf And Vent  06/12/2015   CLINICAL DATA:  Dyspnea and shortness of breath today.  EXAM: NUCLEAR MEDICINE VENTILATION - PERFUSION LUNG SCAN  TECHNIQUE: Ventilation images were obtained in multiple projections using inhaled aerosol Tc-80m DTPA. Perfusion images were obtained in multiple projections after intravenous injection of Tc-80m MAA.  RADIOPHARMACEUTICALS:  42.4 Technetium-55m DTPA aerosol inhalation and 5.9 Technetium-23m MAA IV  COMPARISON:  Single view of the chest this same day.  FINDINGS: Ventilation: No focal ventilation defect.  Perfusion: No wedge shaped peripheral perfusion defects to suggest acute pulmonary embolism.  IMPRESSION: Negative exam.   Electronically Signed   By: Drusilla Kanner M.D.   On: 06/12/2015 13:02   Dg Chest Port 1 View  06/11/2015   CLINICAL DATA:  Acute onset of respiratory distress earlier this evening. Patient on BiPAP.  EXAM: PORTABLE CHEST - 1 VIEW  COMPARISON:  CT chest 01/25/2015.  FINDINGS: Suboptimal inspiration accounts for crowded bronchovascular markings  diffusely and atelectasis in the bases, and accentuates the cardiac silhouette. Taking this into account, cardiac silhouette mildly enlarged for AP portable technique, unchanged. Pleuroparenchymal scarring in the right upper lobe adjacent to fractures of the right lateral 3rd and 4th ribs. Lungs otherwise clear. Pulmonary vascularity normal.  IMPRESSION: Suboptimal inspiration accounts for mild bibasilar atelectasis. No acute cardiopulmonary disease otherwise. Stable mild cardiomegaly without pulmonary edema.   Electronically Signed   By: Hulan Saas M.D.   On: 06/11/2015 23:20   Dg Knee Complete 4 Views Right  05/25/2015   CLINICAL DATA:  Status post fall, with right knee pain. Initial encounter.  EXAM: RIGHT KNEE - COMPLETE 4+ VIEW  COMPARISON:  None.  FINDINGS: There is no evidence of fracture or dislocation. Marginal osteophytes are seen arising at the medial and lateral compartments, with sclerotic change at the lateral compartment and associated mild cortical irregularity. The joint spaces are preserved. No significant degenerative change is seen; the patellofemoral joint is grossly unremarkable in appearance.  Trace knee joint fluid remains within normal limits. Diffuse vascular calcifications are seen.  IMPRESSION: 1. No evidence of fracture or dislocation. 2. 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 3. Diffuse vascular calcifications seen.   Electronically Signed   By: Roanna Raider M.D.   On: 05/25/2015 21:13    PHYSICAL EXAM  The patient is oriented to person time and place. Affect is normal. There is a family member in the room. Lungs reveal scattered rhonchi. Cardiac exam reveals S1 and S2. The abdomen is soft. There is no significant peripheral edema.   TELEMETRY: I have reviewed telemetry today June 13, 2015. There is normal sinus rhythm.   ASSESSMENT AND PLAN:    Acute respiratory failure with  hypoxia     There has been some improvement. This is not been related to any significant diuresis.    ACS (acute coronary syndrome)     There has been mild troponin rise. There is no evidence of a definite MI. 2-D echo is pending. When her respiratory status improves, we can make further decisions about the possible evaluation of ischemic heart disease.    Asthma exacerbation   Willa Rough 06/13/2015 9:53 AM

## 2015-06-13 NOTE — Progress Notes (Signed)
Echocardiogram 2D Echocardiogram has been performed.  Amber [name] 06/13/2015, 3:45 PM

## 2015-06-13 NOTE — Progress Notes (Signed)
NURSING PROGRESS NOTE  Amber [name] [id number] Transfer Data: 06/13/2015 7:51 PM Attending Provider: Richarda Overlie, MD PCP:SUN,YUN, MD Code Status: FULL   Amber [name] is a 59 y.o. female patient transferred from 3S  -No acute distress noted.  -No complaints of shortness of breath.  -No complaints of chest pain.   Cardiac Monitoring: Box # 12 in place. Cardiac monitor yields:normal sinus rhythm.  Last Documented Vital Signs: Blood pressure 112/68, pulse 88, temperature 98.8 F (37.1 C), temperature source Oral, resp. rate 20, height 5' (1.524 m), weight 69.809 kg (153 lb 14.4 oz), SpO2 100 %.  IV Fluids:  IV in place, occlusive dsg intact without redness, IV cath forearm left, condition patent and no redness and antecubital right, condition patent and no redness none.   Allergies:  Ivp dye  Past Medical History:   has a past medical history of Arthritis; Asthma; Hypertension; Constipation; GERD (gastroesophageal reflux disease); Diabetes mellitus without complication; Chronic pain; and Seizures.  Past Surgical History:   has past surgical history that includes Back surgery; Cholecystectomy; and Appendectomy.  Social History:   reports that she has quit smoking. She has never used smokeless tobacco. She reports that she drinks alcohol. She reports that she does not use illicit drugs.  Skin: intact  Patient/Family orientated to room. Information packet given to patient/family. Admission inpatient armband information verified with patient/family to include name and date of birth and placed on patient arm. Side rails up x 2, fall assessment and education completed with patient/family. Patient/family able to verbalize understanding of risk associated with falls and verbalized understanding to call for assistance before getting out of bed. Call light within reach. Patient/family able to voice and demonstrate understanding of unit orientation instructions.

## 2015-06-13 NOTE — Progress Notes (Signed)
[facility] PROGRESS NOTE  Amber [name] QXI:[id number] DOB: [birth year] DOA: 06/11/2015 PCP: [name], MD  Assessment/Plan: Principal Problem:   Acute respiratory failure with hypoxia Active Problems:   Microcytic anemia   Hypertension   History of seizures   ACS (acute coronary syndrome)   Asthma exacerbation   Acute respiratory failure   Pain in the chest   Chest pain with high risk for cardiac etiology/shortness of breath,   likely not acute coronary syndrome but could have underlying coronary artery disease including diabetes, previous history of smoking Troponin elevated but flat,  Heparin drip discontinued after 24 hours, continue aspirin, beta blocker on hold given history of asthma VQ scan negative for pulmonary embolism Discussed with Tereso Newcomer ,[place] with cardiology, will keep nothing by mouth was midnight for possible stress test tomorrow, cards to make the final decision regarding further ischemic workup 2-D echo pending, status post receiving 1 dose of IV Lasix with some improvement in her dyspnea, EF unknown at this time,    Elevated d-dimer of 2.06 VQ scan low probability, lower extremity venous Doppler pending    Diabetes mellitus-patient started on Lantus 30 units, continue sliding scale insulin, continue to hold metformin  Hypertension-holding Toprol-XL  Gastroesophageal reflux disease-continue Protonix  Asthma-continue prednisone,dulera, when necessary nebulizer treatments  Chronic pain syndrome-continue Opana and oxycodone, Valium, dc Zanaflex per pt request   History of seizure disorder-continue Keppra  Peripheral neuropathy, increase dose of gabapentin to 200 mg TID        Code Status:      Code Status Orders        Start     Ordered   06/12/15 0426  Full code   Continuous     06/12/15 0426     Family Communication: family updated about patient's clinical progress Disposition Plan:  Transfer to telemetry, await further  disposition per cardiology   Brief narrative: 59 y.o. female with history of asthma, hypertension, hyperglycemia, nonspecific arthritis and chronic pain and seizures was brought to the ER after patient was found to have sudden onset of severe shortness of breath. Patient was initially placed on BiPAP in the ER. At the field patient was given epinephrine injection intramuscular. Patient was placed on nebulizer and steroids. After patient's shortness of breath improved patient was weaned off BiPAP. On my exam patient is not wheezing and has no chest pain at this time. Patient states over the last 2 weeks patient has been gradually worsening shortness of breath with left-sided burning type of chest pain. Patient chest pain increases on exertion. Denies any nausea vomiting or abdominal pain productive cough or fever chills. Chest x-ray was unremarkable. Troponin is mildly elevated and cardiology has been consulted. Labs also revealed metabolic acidosis.   Consultants:  Cardiology  Procedures:  None  Antibiotics: Anti-infectives    None         HPI/Subjective: Chest pain-free, improving  bilateral lower extremity edema, continues to complain of numbness and tingling in her feet  Objective: Filed Vitals:   06/13/15 0328 06/13/15 0459 06/13/15 0826 06/13/15 0850  BP: 97/64  118/79   Pulse: 88     Temp: 98.4 F (36.9 C)  98.1 F (36.7 C)   TempSrc: Oral  Oral   Resp: 15  18   Height:      Weight:  67.9 kg (149 lb 11.1 oz)    SpO2: 100%  99% 100%    Intake/Output Summary (Last 24 hours) at 06/13/15 [phone]  Last data filed at 06/13/15 [id number]  Gross per 24 hour  Intake 1791.51 ml  Output   1900 ml  Net -108.49 ml    Exam:  General: No acute respiratory distress Lungs: Clear to auscultation bilaterally without wheezes or crackles Cardiovascular: Regular rate and rhythm without murmur gallop or rub normal S1 and S2 Abdomen: Nontender, nondistended, soft, bowel sounds positive, no  rebound, no ascites, no appreciable mass Extremities: No significant cyanosis, clubbing, or edema bilateral lower extremities     Data Review   Micro Results Recent Results (from the past 240 hour(s))  MRSA PCR Screening     Status: None   Collection Time: 06/12/15  2:47 AM  Result Value Ref Range Status   MRSA by PCR NEGATIVE NEGATIVE Final    Comment:        The GeneXpert MRSA Assay (FDA approved for NASAL specimens only), is one component of a comprehensive MRSA colonization surveillance program. It is not intended to diagnose MRSA infection nor to guide or monitor treatment for MRSA infections.     Radiology Reports Dg Thoracic Spine 2 View  05/25/2015   CLINICAL DATA:  Fall. Thoracic spine pain diffusely. Surgery 2 years ago.  EXAM: THORACIC SPINE - 2-3 VIEWS  COMPARISON:  01/25/2015  FINDINGS: T5 corpectomy spacer observed extending up to involve part of the T4 vertebral body. Posterolateral rod and pedicle screw fixator noted, not appreciably changed from 01/25/2015.  Right upper lateral rib deformities, chronic and stable. T12 compression fracture, chronic, stable. Considerable cervical spondylosis noted with loss of vertebral body height at C3-4 and 4 mm anterolisthesis at C3-4. Questionable prevertebral soft tissue swelling in the cervical spine.  The upper vertebral bodies have indistinct cortical margins. There might be some superior endplate concavity at T3 which was not present on 01/25/2015, although this is not entirely certain. Questionable linear lucency along the left hemidiaphragm on the lateral projection, probably from atelectasis.  IMPRESSION: 1. Questionable superior endplate concavity at T3 could conceivably represent an interval fracture compared to 01/25/15. CT of the thoracic spine would provide better definition. 2. Cervical spondylosis with 4 mm of anterolisthesis at C3-4, this subluxation is age indeterminate. Consider dedicated imaging of the cervical spine.  3. Prior corpectomy at T5 with spanning posterolateral rod and pedicle screw fixation as before. 4. Faint linear opacity along one of the hemidiaphragms on the lateral view, possibly the left, probably from atelectasis. 5. Old compression at T12.   Electronically Signed   By: Gaylyn Rong M.D.   On: 05/25/2015 21:21   Ct Cervical Spine Wo Contrast  05/25/2015   CLINICAL DATA:  Fall with neck pain and upper back pain. Initial encounter.  EXAM: CT CERVICAL SPINE WITHOUT CONTRAST  TECHNIQUE: Multidetector CT imaging of the cervical spine was performed without intravenous contrast. Multiplanar CT image reconstructions were also generated.  COMPARISON:  None.  FINDINGS: No evidence of acute fracture. No traumatic malalignment suspected. No prevertebral edema or gross cervical canal hematoma.  Incomplete ossification of the posterior arch of C1 with hypertrophy of the anterior arch. The rudimentary posterior arch of C1 is non segmented from the C2 posterior ring.  There is multilevel degenerative disc narrowing and endplate spurring. Facet arthropathy, greatest at C3-4 with 3 mm of anterolisthesis and bilateral foraminal stenosis. Posterior rod and pedicle screw fixation in the upper thoracic spine, without complication at the imaged levels. There is contemporaneous dedicated thoracic spine imaging.  IMPRESSION: 1. No acute finding. 2. Multilevel degenerative disc and facet  disease. Facet arthropathy is greatest at C3-4 where there is anterolisthesis and bilateral foraminal stenosis.   Electronically Signed   By: Marnee Spring M.D.   On: 05/25/2015 22:07   Ct Thoracic Spine Wo Contrast  05/25/2015   CLINICAL DATA:  Status post fall, with upper back pain. Initial encounter.  EXAM: CT THORACIC SPINE WITHOUT CONTRAST  TECHNIQUE: Multidetector CT imaging of the thoracic spine was performed without intravenous contrast administration. Multiplanar CT image reconstructions were also generated.  COMPARISON:  CT of the  chest performed 01/25/2015  FINDINGS: There is no evidence of acute fracture or subluxation along the thoracic spine. The previously noted chronic compression deformity at the lower thoracic spine turns out to be at vertebral body T11.  The patient is status post posterior thoracic spinal fusion extending from T1 through T8, with decompression noted at T3-T5, and associated prosthesis at T3-T5. No new compression deformities are seen along the thoracic spine. Multilevel disc space narrowing is noted along the lower cervical spine, with scattered small anterior and posterior disc osteophyte complexes. Minimal vacuum phenomenon is noted at multiple levels along the lower thoracic spine.  The visualized portions of the lungs are grossly clear, aside from minimal left basilar atelectasis. Mildly prominent extrapleural fat is noted bilaterally, with minimal pleural calcification noted near the right lung apex. The visualized portions of mediastinum are grossly unremarkable, aside from scattered coronary artery calcification.  The paraspinal musculature appears grossly intact.  IMPRESSION: 1. No evidence of acute fracture or subluxation along the thoracic spine. 2. Chronic compression deformity again noted at vertebral body T11. 3. Status post thoracic spinal fusion extending from T1-T8, with decompression at T3-T5, and associated prosthesis. 4. Minimal degenerative change along the lower cervical spine. 5. Minimal left basilar atelectasis noted. Minimal pleural calcification near the right lung apex. 6. Scattered coronary artery calcifications seen.   Electronically Signed   By: Roanna Raider M.D.   On: 05/25/2015 22:41   Nm Pulmonary Perf And Vent  06/12/2015   CLINICAL DATA:  Dyspnea and shortness of breath today.  EXAM: NUCLEAR MEDICINE VENTILATION - PERFUSION LUNG SCAN  TECHNIQUE: Ventilation images were obtained in multiple projections using inhaled aerosol Tc-31m DTPA. Perfusion images were obtained in multiple  projections after intravenous injection of Tc-19m MAA.  RADIOPHARMACEUTICALS:  42.4 Technetium-76m DTPA aerosol inhalation and 5.9 Technetium-48m MAA IV  COMPARISON:  Single view of the chest this same day.  FINDINGS: Ventilation: No focal ventilation defect.  Perfusion: No wedge shaped peripheral perfusion defects to suggest acute pulmonary embolism.  IMPRESSION: Negative exam.   Electronically Signed   By: Drusilla Kanner M.D.   On: 06/12/2015 13:02   Dg Chest Port 1 View  06/11/2015   CLINICAL DATA:  Acute onset of respiratory distress earlier this evening. Patient on BiPAP.  EXAM: PORTABLE CHEST - 1 VIEW  COMPARISON:  CT chest 01/25/2015.  FINDINGS: Suboptimal inspiration accounts for crowded bronchovascular markings diffusely and atelectasis in the bases, and accentuates the cardiac silhouette. Taking this into account, cardiac silhouette mildly enlarged for AP portable technique, unchanged. Pleuroparenchymal scarring in the right upper lobe adjacent to fractures of the right lateral 3rd and 4th ribs. Lungs otherwise clear. Pulmonary vascularity normal.  IMPRESSION: Suboptimal inspiration accounts for mild bibasilar atelectasis. No acute cardiopulmonary disease otherwise. Stable mild cardiomegaly without pulmonary edema.   Electronically Signed   By: Hulan Saas M.D.   On: 06/11/2015 23:20   Dg Knee Complete 4 Views Right  05/25/2015   CLINICAL DATA:  Status post fall, with right knee pain. Initial encounter.  EXAM: RIGHT KNEE - COMPLETE 4+ VIEW  COMPARISON:  None.  FINDINGS: There is no evidence of fracture or dislocation. Marginal osteophytes are seen arising at the medial and lateral compartments, with sclerotic change at the lateral compartment and associated mild cortical irregularity. The joint spaces are preserved. No significant degenerative change is seen; the patellofemoral joint is grossly unremarkable in appearance.  Trace knee joint fluid remains within normal limits. Diffuse vascular  calcifications are seen.  IMPRESSION: 1. No evidence of fracture or dislocation. 2. 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 3. Diffuse vascular calcifications seen.   Electronically Signed   By: Roanna Raider M.D.   On: 05/25/2015 21:13     CBC  Recent Labs Lab 06/11/15 2255 06/12/15 0510 06/13/15 0245  WBC 16.3* 11.6* 15.9*  HGB 10.3* 9.7* 8.9*  HCT 32.0* 30.1* 27.9*  PLT 318 272 260  MCV 81.6 82.9 81.1  MCH 26.3 26.7 25.9*  MCHC 32.2 32.2 31.9  RDW 14.9 15.2 14.9  LYMPHSABS  --  1.0  --   MONOABS  --  0.1  --   EOSABS  --  0.0  --   BASOSABS  --  0.0  --     Chemistries   Recent Labs Lab 06/11/15 2255 06/12/15 0510  NA 138 140  K 3.4* 3.5  CL 105 109  CO2 20* 18*  GLUCOSE 147* 193*  BUN 12 11  CREATININE 1.10* 0.88  CALCIUM 9.4 8.6*  MG 2.9*  --   AST 30 24  ALT 17 15  ALKPHOS 103 92  BILITOT 0.2* 0.2*   ------------------------------------------------------------------------------------------------------------------ estimated creatinine clearance is 61.4 mL/min (by C-G formula based on Cr of 0.88). ------------------------------------------------------------------------------------------------------------------ No results for input(s): HGBA1C in the last 72 hours. ------------------------------------------------------------------------------------------------------------------ No results for input(s): CHOL, HDL, LDLCALC, TRIG, CHOLHDL, LDLDIRECT in the last 72 hours. ------------------------------------------------------------------------------------------------------------------ No results for input(s): TSH, T4TOTAL, T3FREE, THYROIDAB in the last 72 hours.  Invalid input(s): FREET3 ------------------------------------------------------------------------------------------------------------------ No results for input(s): VITAMINB12, FOLATE, FERRITIN, TIBC, IRON, RETICCTPCT  in the last 72 hours.  Coagulation profile No results for input(s): INR, PROTIME in the last 168 hours.   Recent Labs  06/12/15 0900  DDIMER 2.06*    Cardiac Enzymes  Recent Labs Lab 06/12/15 0510 06/12/15 1040 06/12/15 1615  TROPONINI 0.08* 0.08* 0.06*   ------------------------------------------------------------------------------------------------------------------ Invalid input(s): POCBNP   CBG:  Recent Labs Lab 06/12/15 0252 06/12/15 0808 06/12/15 1313 06/12/15 1624 06/12/15 2109  GLUCAP 194* 211* 378* 142* 158*       Studies: Nm Pulmonary Perf And Vent  06/12/2015   CLINICAL DATA:  Dyspnea and shortness of breath today.  EXAM: NUCLEAR MEDICINE VENTILATION - PERFUSION LUNG SCAN  TECHNIQUE: Ventilation images were obtained in multiple projections using inhaled aerosol Tc-79m DTPA. Perfusion images were obtained in multiple projections after intravenous injection of Tc-66m MAA.  RADIOPHARMACEUTICALS:  42.4 Technetium-35m DTPA aerosol inhalation and 5.9 Technetium-63m MAA IV  COMPARISON:  Single view of the chest this same day.  FINDINGS: Ventilation: No focal ventilation defect.  Perfusion: No wedge shaped peripheral perfusion defects to suggest acute pulmonary embolism.  IMPRESSION: Negative exam.   Electronically Signed   By: Drusilla Kanner M.D.   On: 06/12/2015 13:02   Dg Chest Port 1 View  06/11/2015   CLINICAL DATA:  Acute onset of respiratory distress earlier this evening. Patient on BiPAP.  EXAM: PORTABLE CHEST -  1 VIEW  COMPARISON:  CT chest 01/25/2015.  FINDINGS: Suboptimal inspiration accounts for crowded bronchovascular markings diffusely and atelectasis in the bases, and accentuates the cardiac silhouette. Taking this into account, cardiac silhouette mildly enlarged for AP portable technique, unchanged. Pleuroparenchymal scarring in the right upper lobe adjacent to fractures of the right lateral 3rd and 4th ribs. Lungs otherwise clear. Pulmonary  vascularity normal.  IMPRESSION: Suboptimal inspiration accounts for mild bibasilar atelectasis. No acute cardiopulmonary disease otherwise. Stable mild cardiomegaly without pulmonary edema.   Electronically Signed   By: Hulan Saas M.D.   On: 06/11/2015 23:20      Lab Results  Component Value Date   HGBA1C 4.9 01/26/2015   Lab Results  Component Value Date   CREATININE 0.88 06/12/2015       Scheduled Meds: . allopurinol  300 mg Oral Daily  . amLODipine  5 mg Oral Daily  . antiseptic oral rinse  7 mL Mouth Rinse BID  . aspirin EC  325 mg Oral Daily  . folic acid  1 mg Oral Daily  . gabapentin  100 mg Oral TID  . insulin aspart  0-9 Units Subcutaneous TID WC  . insulin glargine  30 Units Subcutaneous Daily  . levETIRAcetam  500 mg Oral BID  . mometasone-formoterol  2 puff Inhalation BID  . oxyCODONE  20 mg Oral Q4H while awake  . oxymorphone  40 mg Oral Q12H  . pantoprazole  40 mg Oral Daily  . potassium chloride SA  40 mEq Oral Daily  . predniSONE  20 mg Oral Q breakfast  . sodium chloride  3 mL Intravenous Q12H  . Vitamin D (Ergocalciferol)  50,000 Units Oral Q Sun   Continuous Infusions:   Principal Problem:   Acute respiratory failure with hypoxia Active Problems:   Microcytic anemia   Hypertension   History of seizures   ACS (acute coronary syndrome)   Asthma exacerbation   Acute respiratory failure   Pain in the chest    Time spent: 45 minutes   [facility]  [facility] Pager [phone]. If 7PM-7AM, please contact night-coverage at [email or web address], password [facility] 06/13/2015, 9:08 AM  LOS: 1 day

## 2015-06-13 NOTE — Progress Notes (Signed)
Report called to rn pt transferring to [id number] via w/c with belongings and boyfriend.

## 2015-06-13 NOTE — Progress Notes (Signed)
Gave pt her oxycodone 20mg  po while pt on bedside commode, dropped one of the tablets in commode, witness Kimo Bancroft RN, elizabeth roof NT, showed to deanna dillon rn and wasted the 5mg  tablet in pyxis. Reordered her 5mg  oxycodone to make her total 20mg .

## 2015-06-13 NOTE — Progress Notes (Signed)
ANTICOAGULATION CONSULT NOTE  Pharmacy Consult for Heparin  Indication: chest pain/ACS  Allergies  Allergen Reactions  . Ivp Dye [Iodinated Diagnostic Agents] Itching    Patient Measurements: Height: 5' (152.4 cm) Weight: 150 lb 2.1 oz (68.1 kg) IBW/kg (Calculated) : 45.5  Vital Signs: Temp: 98.4 F (36.9 C) (07/24 0328) Temp Source: Oral (07/24 0328) BP: 97/64 mmHg (07/24 0328) Pulse Rate: 88 (07/24 0328)  Labs:  Recent Labs  06/11/15 2255 06/12/15 0510 06/12/15 0900 06/12/15 1040 06/12/15 1545 06/12/15 1615 06/13/15 0245  HGB 10.3* 9.7*  --   --   --   --  8.9*  HCT 32.0* 30.1*  --   --   --   --  27.9*  PLT 318 272  --   --   --   --  260  HEPARINUNFRC  --   --  0.44  --  0.43  --  0.20*  CREATININE 1.10* 0.88  --   --   --   --   --   TROPONINI 0.09* 0.08*  --  0.08*  --  0.06*  --     Estimated Creatinine Clearance: 61.4 mL/min (by C-G formula based on Cr of 0.88).  Assessment: 59 y.o. female with chest pain/SOB, possible ACS, for heparin.    Goal of Therapy:  Heparin level 0.3-0.7 units/ml Monitor platelets by anticoagulation protocol: Yes   Plan:  Increase Heparin 1000 units/hr Check heparin level in 8 hours.   Eddie Candle 06/13/2015,3:46 AM

## 2015-06-13 NOTE — Progress Notes (Signed)
VASCULAR LAB PRELIMINARY  PRELIMINARY  PRELIMINARY  PRELIMINARY  Bilateral lower extremity venous Dopplers completed.    Preliminary report:  There is no DVT or SVT noted in the bilateral lower extremities.   Miraya Cudney, RVT 06/13/2015, 3:29 PM

## 2015-06-14 LAB — GLUCOSE, CAPILLARY
Glucose-Capillary: 107 mg/dL — ABNORMAL HIGH (ref 65–99)
Glucose-Capillary: 101 mg/dL — ABNORMAL HIGH (ref 65–99)
Glucose-Capillary: 209 mg/dL — ABNORMAL HIGH (ref 65–99)

## 2015-06-14 LAB — COMPREHENSIVE METABOLIC PANEL
ALK PHOS: 102 U/L (ref 38–126)
ALT: 23 U/L (ref 14–54)
ANION GAP: 6 (ref 5–15)
AST: 50 U/L — ABNORMAL HIGH (ref 15–41)
Albumin: 3.1 g/dL — ABNORMAL LOW (ref 3.5–5.0)
BUN: 17 mg/dL (ref 6–20)
CHLORIDE: 110 mmol/L (ref 101–111)
CO2: 25 mmol/L (ref 22–32)
Calcium: 9.7 mg/dL (ref 8.9–10.3)
Creatinine, Ser: 0.99 mg/dL (ref 0.44–1.00)
GFR calc Af Amer: >60 mL/min (ref >60)
GFR calc non Af Amer: >60 mL/min (ref >60)
GLUCOSE: 98 mg/dL (ref 65–99)
POTASSIUM: 4.8 mmol/L (ref 3.5–5.1)
SODIUM: 141 mmol/L (ref 135–145)
Total Bilirubin: 0.5 mg/dL (ref 0.3–1.2)
Total Protein: 6.5 g/dL (ref 6.5–8.1)

## 2015-06-14 LAB — CBC
HCT: 30.5 % — ABNORMAL LOW (ref 36.0–46.0)
Hemoglobin: 9.9 g/dL — ABNORMAL LOW (ref 12.0–15.0)
MCH: 26.7 pg (ref 26.0–34.0)
MCHC: 32.5 g/dL (ref 30.0–36.0)
MCV: 82.2 fL (ref 78.0–100.0)
Platelets: 265 10*3/uL (ref 150–400)
RBC: 3.71 MIL/uL — AB (ref 3.87–5.11)
RDW: 15.2 % (ref 11.5–15.5)
WBC: 12 10*3/uL — AB (ref 4.0–10.5)

## 2015-06-14 MED ORDER — POLYETHYLENE GLYCOL 3350 17 G PO PACK: 17.0000 g | PACK | Freq: Two times a day (BID) | ORAL | Status: DC

## 2015-06-14 MED ORDER — DIPHENHYDRAMINE HCL 25 MG PO CAPS
25.0000 mg | ORAL_CAPSULE | Freq: Once | ORAL | Status: AC
  Administered 2015-06-14: 25 mg via ORAL
  Filled 2015-06-14: qty 1

## 2015-06-14 MED ORDER — GABAPENTIN 300 MG PO CAPS: 300.0000 mg | ORAL_CAPSULE | Freq: Two times a day (BID) | ORAL | Status: DC

## 2015-06-14 MED ORDER — FUROSEMIDE 40 MG PO TABS: 40.0000 mg | ORAL_TABLET | Freq: Every day | ORAL | Status: DC

## 2015-06-14 MED ORDER — ASPIRIN EC 81 MG PO TBEC: 81.0000 mg | DELAYED_RELEASE_TABLET | Freq: Every day | ORAL | Status: DC

## 2015-06-14 MED ORDER — CAMPHOR-MENTHOL 0.5-0.5 % EX LOTN
TOPICAL_LOTION | CUTANEOUS | Status: DC | PRN
  Administered 2015-06-14 (×2): 1 via TOPICAL
  Filled 2015-06-14: qty 222

## 2015-06-14 NOTE — Discharge Summary (Signed)
Physician Discharge Summary  Amber [name] MRN: [id number] DOB/AGE: 1956/11/07 59 y.o.  PCP: Sandi Mariscal, MD   Admit date: 06/11/2015 Discharge date: 06/14/2015  Discharge Diagnoses:     Principal Problem:   Acute respiratory failure with hypoxia Active Problems:   Microcytic anemia   Hypertension   History of seizures   ACS (acute coronary syndrome)   Asthma exacerbation   Acute respiratory failure   Pain in the chest    Follow-up recommendations Follow-up with PCP in 3-5 days , including although additional recommended appointments as below Follow-up CBC, CMP in 3-5 days      Medication List    STOP taking these medications        predniSONE 5 MG tablet  Commonly known as:  DELTASONE     tiZANidine 4 MG tablet  Commonly known as:  ZANAFLEX     triamterene-hydrochlorothiazide 37.5-25 MG per tablet  Commonly known as:  MAXZIDE-25      TAKE these medications        albuterol 108 (90 BASE) MCG/ACT inhaler  Commonly known as:  PROVENTIL HFA;VENTOLIN HFA  Inhale 2 puffs into the lungs every 6 (six) hours as needed for wheezing or shortness of breath.     allopurinol 300 MG tablet  Commonly known as:  ZYLOPRIM  Take 300 mg by mouth daily.     amLODipine 5 MG tablet  Commonly known as:  NORVASC  Take 5 mg by mouth daily.     aspirin EC 81 MG tablet  Take 1 tablet (81 mg total) by mouth daily.     diazepam 5 MG tablet  Commonly known as:  VALIUM  Take 1 tablet (5 mg total) by mouth every 12 (twelve) hours as needed for muscle spasms.     esomeprazole 40 MG capsule  Commonly known as:  NEXIUM  Take 40 mg by mouth daily at 12 noon.     Fluticasone-Salmeterol 250-50 MCG/DOSE Aepb  Commonly known as:  ADVAIR  Inhale 1 puff into the lungs 2 (two) times daily.     folic acid 1 MG tablet  Commonly known as:  FOLVITE  Take 1 mg by mouth daily.     gabapentin 300 MG capsule  Commonly known as:  NEURONTIN  Take 1 capsule (300 mg total) by mouth 2 (two) times  daily.     levETIRAcetam 500 MG tablet  Commonly known as:  KEPPRA  Take 1 tablet (500 mg total) by mouth 2 (two) times daily.     metFORMIN 500 MG 24 hr tablet  Commonly known as:  GLUCOPHAGE-XR  Take 500 mg by mouth daily with breakfast.     metoprolol succinate 25 MG 24 hr tablet  Commonly known as:  TOPROL-XL  Take 25 mg by mouth daily.     Oxycodone HCl 20 MG Tabs  Take 20 mg by mouth every 4 (four) hours.     oxymorphone 40 MG 12 hr tablet  Commonly known as:  OPANA ER  Take 40 mg by mouth every 12 (twelve) hours.     polyethylene glycol packet  Commonly known as:  MIRALAX / GLYCOLAX  Take 17 g by mouth 2 (two) times daily.     potassium chloride SA 20 MEQ tablet  Commonly known as:  K-DUR,KLOR-CON  Take 2 tablets (40 mEq total) by mouth daily.     Vitamin D (Ergocalciferol) 50000 UNITS Caps capsule  Commonly known as:  DRISDOL  Take 1 capsule (50,000 Units total) by mouth every  7 (seven) days. Sunday     VITAMIN E PO  Take 1 capsule by mouth daily.         Discharge Condition: Stable    Disposition: 01-Home or Self Care   Consults:  Cardiology     Significant Diagnostic Studies:  Dg Thoracic Spine 2 View  05/25/2015   CLINICAL DATA:  Fall. Thoracic spine pain diffusely. Surgery 2 years ago.  EXAM: THORACIC SPINE - 2-3 VIEWS  COMPARISON:  01/25/2015  FINDINGS: T5 corpectomy spacer observed extending up to involve part of the T4 vertebral body. Posterolateral rod and pedicle screw fixator noted, not appreciably changed from 01/25/2015.  Right upper lateral rib deformities, chronic and stable. T12 compression fracture, chronic, stable. Considerable cervical spondylosis noted with loss of vertebral body height at C3-4 and 4 mm anterolisthesis at C3-4. Questionable prevertebral soft tissue swelling in the cervical spine.  The upper vertebral bodies have indistinct cortical margins. There might be some superior endplate concavity at T3 which was not present on  01/25/2015, although this is not entirely certain. Questionable linear lucency along the left hemidiaphragm on the lateral projection, probably from atelectasis.  IMPRESSION: 1. Questionable superior endplate concavity at T3 could conceivably represent an interval fracture compared to 01/25/15. CT of the thoracic spine would provide better definition. 2. Cervical spondylosis with 4 mm of anterolisthesis at C3-4, this subluxation is age indeterminate. Consider dedicated imaging of the cervical spine. 3. Prior corpectomy at T5 with spanning posterolateral rod and pedicle screw fixation as before. 4. Faint linear opacity along one of the hemidiaphragms on the lateral view, possibly the left, probably from atelectasis. 5. Old compression at T12.   Electronically Signed   By: Van Clines M.D.   On: 05/25/2015 21:21   Ct Cervical Spine Wo Contrast  05/25/2015   CLINICAL DATA:  Fall with neck pain and upper back pain. Initial encounter.  EXAM: CT CERVICAL SPINE WITHOUT CONTRAST  TECHNIQUE: Multidetector CT imaging of the cervical spine was performed without intravenous contrast. Multiplanar CT image reconstructions were also generated.  COMPARISON:  None.  FINDINGS: No evidence of acute fracture. No traumatic malalignment suspected. No prevertebral edema or gross cervical canal hematoma.  Incomplete ossification of the posterior arch of C1 with hypertrophy of the anterior arch. The rudimentary posterior arch of C1 is non segmented from the C2 posterior ring.  There is multilevel degenerative disc narrowing and endplate spurring. Facet arthropathy, greatest at C3-4 with 3 mm of anterolisthesis and bilateral foraminal stenosis. Posterior rod and pedicle screw fixation in the upper thoracic spine, without complication at the imaged levels. There is contemporaneous dedicated thoracic spine imaging.  IMPRESSION: 1. No acute finding. 2. Multilevel degenerative disc and facet disease. Facet arthropathy is greatest at C3-4  where there is anterolisthesis and bilateral foraminal stenosis.   Electronically Signed   By: Monte Fantasia M.D.   On: 05/25/2015 22:07   Ct Thoracic Spine Wo Contrast  05/25/2015   CLINICAL DATA:  Status post fall, with upper back pain. Initial encounter.  EXAM: CT THORACIC SPINE WITHOUT CONTRAST  TECHNIQUE: Multidetector CT imaging of the thoracic spine was performed without intravenous contrast administration. Multiplanar CT image reconstructions were also generated.  COMPARISON:  CT of the chest performed 01/25/2015  FINDINGS: There is no evidence of acute fracture or subluxation along the thoracic spine. The previously noted chronic compression deformity at the lower thoracic spine turns out to be at vertebral body T11.  The patient is status post posterior thoracic spinal  fusion extending from T1 through T8, with decompression noted at T3-T5, and associated prosthesis at T3-T5. No new compression deformities are seen along the thoracic spine. Multilevel disc space narrowing is noted along the lower cervical spine, with scattered small anterior and posterior disc osteophyte complexes. Minimal vacuum phenomenon is noted at multiple levels along the lower thoracic spine.  The visualized portions of the lungs are grossly clear, aside from minimal left basilar atelectasis. Mildly prominent extrapleural fat is noted bilaterally, with minimal pleural calcification noted near the right lung apex. The visualized portions of mediastinum are grossly unremarkable, aside from scattered coronary artery calcification.  The paraspinal musculature appears grossly intact.  IMPRESSION: 1. No evidence of acute fracture or subluxation along the thoracic spine. 2. Chronic compression deformity again noted at vertebral body T11. 3. Status post thoracic spinal fusion extending from T1-T8, with decompression at T3-T5, and associated prosthesis. 4. Minimal degenerative change along the lower cervical spine. 5. Minimal left basilar  atelectasis noted. Minimal pleural calcification near the right lung apex. 6. Scattered coronary artery calcifications seen.   Electronically Signed   By: Garald Balding M.D.   On: 05/25/2015 22:41   Nm Pulmonary Perf And Vent  06/12/2015   CLINICAL DATA:  Dyspnea and shortness of breath today.  EXAM: NUCLEAR MEDICINE VENTILATION - PERFUSION LUNG SCAN  TECHNIQUE: Ventilation images were obtained in multiple projections using inhaled aerosol Tc-80mDTPA. Perfusion images were obtained in multiple projections after intravenous injection of Tc-964mAA.  RADIOPHARMACEUTICALS:  42.4 Technetium-9924mPA aerosol inhalation and 5.9 Technetium-43m11m IV  COMPARISON:  Single view of the chest this same day.  FINDINGS: Ventilation: No focal ventilation defect.  Perfusion: No wedge shaped peripheral perfusion defects to suggest acute pulmonary embolism.  IMPRESSION: Negative exam.   Electronically Signed   By: ThomInge Rise.   On: 06/12/2015 13:02   Dg Chest Port 1 View  06/11/2015   CLINICAL DATA:  Acute onset of respiratory distress earlier this evening. Patient on BiPAP.  EXAM: PORTABLE CHEST - 1 VIEW  COMPARISON:  CT chest 01/25/2015.  FINDINGS: Suboptimal inspiration accounts for crowded bronchovascular markings diffusely and atelectasis in the bases, and accentuates the cardiac silhouette. Taking this into account, cardiac silhouette mildly enlarged for AP portable technique, unchanged. Pleuroparenchymal scarring in the right upper lobe adjacent to fractures of the right lateral 3rd and 4th ribs. Lungs otherwise clear. Pulmonary vascularity normal.  IMPRESSION: Suboptimal inspiration accounts for mild bibasilar atelectasis. No acute cardiopulmonary disease otherwise. Stable mild cardiomegaly without pulmonary edema.   Electronically Signed   By: ThomEvangeline Dakin.   On: 06/11/2015 23:20   Dg Knee Complete 4 Views Right  05/25/2015   CLINICAL DATA:  Status post fall, with right knee pain. Initial  encounter.  EXAM: RIGHT KNEE - COMPLETE 4+ VIEW  COMPARISON:  None.  FINDINGS: There is no evidence of fracture or dislocation. Marginal osteophytes are seen arising at the medial and lateral compartments, with sclerotic change at the lateral compartment and associated mild cortical irregularity. The joint spaces are preserved. No significant degenerative change is seen; the patellofemoral joint is grossly unremarkable in appearance.  Trace knee joint fluid remains within normal limits. Diffuse vascular calcifications are seen.  IMPRESSION: 1. No evidence of fracture or dislocation. 2. 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 3. Diffuse vascular calcifications seen.   Electronically Signed   By: JeffGarald Balding.   On: 05/25/2015 21:13  Echocardiogram LV EF: 65%  ------------------------------------------------------------------- Indications:   Asthma 493.90. Chest pain 786.51.  ------------------------------------------------------------------- History:  Risk factors: Hypertension. Diabetes mellitus.  ------------------------------------------------------------------- Study Conclusions  - Left ventricle: The cavity size was normal. Wall thickness was increased in a pattern of mild LVH. The estimated ejection fraction was 65%. Wall motion was normal; there were no regional wall motion abnormalities. - Right ventricle: The cavity size was normal. Systolic function was normal. - Right atrium: The atrium was mildly dilated. - Pulmonary arteries: PA peak pressure: 41 mm Hg (S).     -------------------------------------------------------------------  ------------------------------------------------------------------- Left ventricle: The cavity size was normal. Wall thickness was increased in a pattern of mild LVH. The estimated ejection fraction was 65%. Wall motion was normal; there  were no regional wall motion abnormalities.  ------------------------------------------------------------------- Aortic valve:  Structurally normal valve.  Cusp separation was normal. Doppler: Transvalvular velocity was within the normal range. There was no stenosis. There was no regurgitation.  ------------------------------------------------------------------- Aorta: Aortic root: The aortic root was normal in size.  ------------------------------------------------------------------- Mitral valve:  Mildly thickened leaflets . Doppler: There was no significant regurgitation.  Peak gradient (D): 5 mm Hg.  ------------------------------------------------------------------- Left atrium: The atrium was at the upper limits of normal in size.  ------------------------------------------------------------------- Right ventricle: The cavity size was normal. Systolic function was normal.  ------------------------------------------------------------------- Pulmonic valve:  The valve appears to be grossly normal. Doppler: There was no significant regurgitation.  ------------------------------------------------------------------- Tricuspid valve:  The valve appears to be grossly normal. Doppler: There was mild regurgitation.  ------------------------------------------------------------------- Right atrium: The atrium was mildly dilated.  ------------------------------------------------------------------- Pericardium: There was no pericardial effusion.   Filed Weights   06/13/15 0459 06/13/15 1530 06/14/15 0546  Weight: 67.9 kg (149 lb 11.1 oz) 69.809 kg (153 lb 14.4 oz) 69.2 kg (152 lb 8.9 oz)     Microbiology: Recent Results (from the past 240 hour(s))  MRSA PCR Screening     Status: None   Collection Time: 06/12/15  2:47 AM  Result Value Ref Range Status   MRSA by PCR NEGATIVE NEGATIVE Final    Comment:        The GeneXpert MRSA Assay (FDA approved for  NASAL specimens only), is one component of a comprehensive MRSA colonization surveillance program. It is not intended to diagnose MRSA infection nor to guide or monitor treatment for MRSA infections.        Blood Culture    Component Value Date/Time   [facility] URINE, CLEAN CATCH 06/07/2014 2020   [place] NONE 06/07/2014 2020   CULT  06/07/2014 2020    ESCHERICHIA COLI Performed at [facility] 06/10/2014 FINAL 06/07/2014 2020      Labs: Results for orders placed or performed during the hospital encounter of 06/11/15 (from the past 48 hour(s))  Troponin I (q 6hr x 3)     Status: Abnormal   Collection Time: 06/12/15 10:40 AM  Result Value Ref Range   Troponin I 0.08 (H) <0.031 ng/mL    Comment:        PERSISTENTLY INCREASED TROPONIN VALUES IN THE RANGE OF 0.04-0.49 ng/mL CAN BE SEEN IN:       -UNSTABLE ANGINA       -CONGESTIVE HEART FAILURE       -MYOCARDITIS       -CHEST TRAUMA       -ARRYHTHMIAS       -LATE PRESENTING MYOCARDIAL INFARCTION       -COPD   CLINICAL FOLLOW-UP RECOMMENDED.   Glucose,  capillary     Status: Abnormal   Collection Time: 06/12/15  1:13 PM  Result Value Ref Range   Glucose-Capillary 378 (H) 65 - 99 mg/dL  Heparin level (unfractionated)     Status: None   Collection Time: 06/12/15  3:45 PM  Result Value Ref Range   Heparin Unfractionated 0.43 0.30 - 0.70 IU/mL    Comment:        IF HEPARIN RESULTS ARE BELOW EXPECTED VALUES, AND PATIENT DOSAGE HAS BEEN CONFIRMED, SUGGEST FOLLOW UP TESTING OF ANTITHROMBIN III LEVELS.   Troponin I (q 6hr x 3)     Status: Abnormal   Collection Time: 06/12/15  4:15 PM  Result Value Ref Range   Troponin I 0.06 (H) <0.031 ng/mL    Comment:        PERSISTENTLY INCREASED TROPONIN VALUES IN THE RANGE OF 0.04-0.49 ng/mL CAN BE SEEN IN:       -UNSTABLE ANGINA       -CONGESTIVE HEART FAILURE       -MYOCARDITIS       -CHEST TRAUMA       -ARRYHTHMIAS       -LATE PRESENTING MYOCARDIAL  INFARCTION       -COPD   CLINICAL FOLLOW-UP RECOMMENDED.   Glucose, capillary     Status: Abnormal   Collection Time: 06/12/15  4:24 PM  Result Value Ref Range   Glucose-Capillary 142 (H) 65 - 99 mg/dL   Comment 1 Notify RN    Comment 2 Document in Chart   Glucose, capillary     Status: Abnormal   Collection Time: 06/12/15  9:09 PM  Result Value Ref Range   Glucose-Capillary 158 (H) 65 - 99 mg/dL  CBC     Status: Abnormal   Collection Time: 06/13/15  2:45 AM  Result Value Ref Range   WBC 15.9 (H) 4.0 - 10.5 K/uL   RBC 3.44 (L) 3.87 - 5.11 MIL/uL   Hemoglobin 8.9 (L) 12.0 - 15.0 g/dL   HCT 27.9 (L) 36.0 - 46.0 %   MCV 81.1 78.0 - 100.0 fL   MCH 25.9 (L) 26.0 - 34.0 pg   MCHC 31.9 30.0 - 36.0 g/dL   RDW 14.9 11.5 - 15.5 %   Platelets 260 150 - 400 K/uL  Heparin level (unfractionated)     Status: Abnormal   Collection Time: 06/13/15  2:45 AM  Result Value Ref Range   Heparin Unfractionated 0.20 (L) 0.30 - 0.70 IU/mL    Comment:        IF HEPARIN RESULTS ARE BELOW EXPECTED VALUES, AND PATIENT DOSAGE HAS BEEN CONFIRMED, SUGGEST FOLLOW UP TESTING OF ANTITHROMBIN III LEVELS.   Glucose, capillary     Status: Abnormal   Collection Time: 06/13/15  8:24 AM  Result Value Ref Range   Glucose-Capillary 145 (H) 65 - 99 mg/dL   Comment 1 Notify RN    Comment 2 Document in Chart   Glucose, capillary     Status: Abnormal   Collection Time: 06/13/15 12:17 PM  Result Value Ref Range   Glucose-Capillary 147 (H) 65 - 99 mg/dL   Comment 1 Notify RN    Comment 2 Document in Chart   Heparin level (unfractionated)     Status: Abnormal   Collection Time: 06/13/15  1:00 PM  Result Value Ref Range   Heparin Unfractionated <0.10 (L) 0.30 - 0.70 IU/mL    Comment:        IF HEPARIN RESULTS ARE BELOW EXPECTED VALUES, AND  PATIENT DOSAGE HAS BEEN CONFIRMED, SUGGEST FOLLOW UP TESTING OF ANTITHROMBIN III LEVELS.   Glucose, capillary     Status: Abnormal   Collection Time: 06/13/15  4:02 PM   Result Value Ref Range   Glucose-Capillary 311 (H) 65 - 99 mg/dL   Comment 1 Notify RN    Comment 2 Document in Chart   Glucose, capillary     Status: Abnormal   Collection Time: 06/13/15  9:11 PM  Result Value Ref Range   Glucose-Capillary 134 (H) 65 - 99 mg/dL   Comment 1 Notify RN    Comment 2 Document in Chart   CBC     Status: Abnormal   Collection Time: 06/14/15  5:18 AM  Result Value Ref Range   WBC 12.0 (H) 4.0 - 10.5 K/uL   RBC 3.71 (L) 3.87 - 5.11 MIL/uL   Hemoglobin 9.9 (L) 12.0 - 15.0 g/dL   HCT 30.5 (L) 36.0 - 46.0 %   MCV 82.2 78.0 - 100.0 fL   MCH 26.7 26.0 - 34.0 pg   MCHC 32.5 30.0 - 36.0 g/dL   RDW 15.2 11.5 - 15.5 %   Platelets 265 150 - 400 K/uL  Comprehensive metabolic panel     Status: Abnormal   Collection Time: 06/14/15  5:18 AM  Result Value Ref Range   Sodium 141 135 - 145 mmol/L   Potassium 4.8 3.5 - 5.1 mmol/L   Chloride 110 101 - 111 mmol/L   CO2 25 22 - 32 mmol/L   Glucose, Bld 98 65 - 99 mg/dL   BUN 17 6 - 20 mg/dL   Creatinine, Ser 0.99 0.44 - 1.00 mg/dL   Calcium 9.7 8.9 - 10.3 mg/dL   Total Protein 6.5 6.5 - 8.1 g/dL   Albumin 3.1 (L) 3.5 - 5.0 g/dL   AST 50 (H) 15 - 41 U/L   ALT 23 14 - 54 U/L   Alkaline Phosphatase 102 38 - 126 U/L   Total Bilirubin 0.5 0.3 - 1.2 mg/dL   GFR calc non Af Amer >60 >60 mL/min   GFR calc Af Amer >60 >60 mL/min    Comment: (NOTE) The eGFR has been calculated using the CKD EPI equation. This calculation has not been validated in all clinical situations. eGFR's persistently <60 mL/min signify possible Chronic Kidney Disease.    Anion gap 6 5 - 15  Glucose, capillary     Status: Abnormal   Collection Time: 06/14/15  6:35 AM  Result Value Ref Range   Glucose-Capillary 107 (H) 65 - 99 mg/dL   Comment 1 Notify RN    Comment 2 Document in Chart   Glucose, capillary     Status: Abnormal   Collection Time: 06/14/15  8:00 AM  Result Value Ref Range   Glucose-Capillary 101 (H) 65 - 99 mg/dL     Lipid  Panel  No results found for: CHOL, TRIG, HDL, CHOLHDL, VLDL, LDLCALC, LDLDIRECT   Lab Results  Component Value Date   HGBA1C 4.9 01/26/2015     Lab Results  Component Value Date   CREATININE 0.99 06/14/2015     HPI :21F female smoker with DM, HTN, asthma, GERD, seizure disorder, arthritis on chronic steroids, who presents with respiratory distress, chest burning, and positive troponin.  This afternoon, Ms. Batton developed SOB and felt very hot. She went inside her house at the recommendation of her son but continued to have these symptoms. She had associated lightheadeness, flashes of light, and a "burning" sensation  in her left chest. She also notes some mild LE edema, weight fluctuations, leg burning. No cough of sputum production. EMS was called due to significant dyspnea. EMS placed patient on bipap and gave DuoNeb's, Solu-Medrol, magnesium and IM epinephrine  On arrival to the ER she was tachycardic (147bpm), tachypneic (30), and hypertensive (154/105), saturating 99% on BiPAP. She appeared anxious. ECG demonstrate ST. NSSTTWC. Compared to 01/26/15, no significant change. ER exam on arrival reportedly demonstrated "decreased breath sounds" but no mention of wheezing. Labs were notable for K 3.4, Cr 1.1, Mg 2.9, BNP 51, TnI 0.09 (FU POC TnI 0.00), WBC 16.3, ABG 7.4/32/239. CXR demonstrated no acute cardiopulmonary disease otherwise but stable mild cardiomegaly without pulmonary edema.She was given nebulizeres, ASA, ativan, lopressor, and morphine, and started on IV UFH.  HOSPITAL COURSE:   Chest pain with high risk for cardiac etiology/shortness of breath,  likely not acute coronary syndrome but could have underlying coronary artery disease including diabetes, previous history of smoking. According to cardiology there is no evidence of a definite MI, 2-D echo reassuring, no indication for stress test at this time as per cardiology Troponin elevated but flat,  Heparin drip  discontinued 24 hours after admission.   Beta blocker resumed, patient has also been started on a baby aspirin VQ scan negative for pulmonary embolism, lower extremity venous Doppler negative Shortness of breath improved with low-dose prednisone which was started because of history of asthma  Acute on chronic diastolic heart failure Patient presented with some bilateral lower extremity edema resolved after receiving IV Lasix Mild LVH on 2-D echo, patient will be discharged home on Lasix 40 mg daily PCP requested to follow-up electrolytes post discharge   Elevated d-dimer of 2.06 VQ scan low probability, lower extremity venous Doppler negative   Diabetes mellitus-patient was treated with low-dose prednisone, subsequently caused her CBGs to be elevated, started on Lantus 30 units, and sliding scale insulin,  However steroids are being discontinued now and the patient is expected to resume home regimen with normalization of her CBGs Last hemoglobin A1c was 4.9  Hypertension-resume Toprol-XL, continue Norvasc  Gastroesophageal reflux disease-continue Protonix  Asthma-completed PO prednisone,dulera, when necessary nebulizer treatments  Chronic pain syndrome-continue Opana and oxycodone, Valium, dc Zanaflex per pt request   History of seizure disorder-continue Keppra  Peripheral neuropathy, patient complains of history of back surgery chronic low back pain and bilateral lower extremity numbness and tingling, started on gabapentin , dose being increased to 300 mg twice a day prior to discharge, would benefit from continued close follow-up with the pain clinic    Discharge Exam:   Blood pressure 133/84, pulse 92, temperature 97.9 F (36.6 C), temperature source Oral, resp. rate 20, height 5' (1.524 m), weight 69.2 kg (152 lb 8.9 oz), SpO2 98 %.  General: No acute respiratory distress Lungs: Clear to auscultation bilaterally without wheezes or crackles Cardiovascular: Regular rate and  rhythm without murmur gallop or rub normal S1 and S2 Abdomen: Nontender, nondistended, soft, bowel sounds positive, no rebound, no ascites, no appreciable mass Extremities: No significant cyanosis, clubbing, or edema bilateral lower extremities       Discharge Instructions    Diet - low sodium heart healthy    Complete by:  As directed      Increase activity slowly    Complete by:  As directed              Signed: Lydie Stammen 06/14/2015, 10:28 AM        Time spent >45  mins

## 2015-06-14 NOTE — Progress Notes (Signed)
Spoke with Dr. Myrtis Ser, no stress test at this time.

## 2015-06-14 NOTE — Progress Notes (Signed)
Nsg Discharge Note  Admit Date:  06/11/2015 Discharge date: 06/14/2015   Afifa Truax to be D/C'd Home per MD order.  AVS completed.  Copy for chart, and copy for patient signed, and dated. Patient/caregiver able to verbalize understanding.  Discharge Medication:   Medication List    STOP taking these medications        predniSONE 5 MG tablet  Commonly known as:  DELTASONE     tiZANidine 4 MG tablet  Commonly known as:  ZANAFLEX     triamterene-hydrochlorothiazide 37.5-25 MG per tablet  Commonly known as:  MAXZIDE-25      TAKE these medications        albuterol 108 (90 BASE) MCG/ACT inhaler  Commonly known as:  PROVENTIL HFA;VENTOLIN HFA  Inhale 2 puffs into the lungs every 6 (six) hours as needed for wheezing or shortness of breath.     allopurinol 300 MG tablet  Commonly known as:  ZYLOPRIM  Take 300 mg by mouth daily.     amLODipine 5 MG tablet  Commonly known as:  NORVASC  Take 5 mg by mouth daily.     aspirin EC 81 MG tablet  Take 1 tablet (81 mg total) by mouth daily.     diazepam 5 MG tablet  Commonly known as:  VALIUM  Take 1 tablet (5 mg total) by mouth every 12 (twelve) hours as needed for muscle spasms.     esomeprazole 40 MG capsule  Commonly known as:  NEXIUM  Take 40 mg by mouth daily at 12 noon.     Fluticasone-Salmeterol 250-50 MCG/DOSE Aepb  Commonly known as:  ADVAIR  Inhale 1 puff into the lungs 2 (two) times daily.     folic acid 1 MG tablet  Commonly known as:  FOLVITE  Take 1 mg by mouth daily.     furosemide 40 MG tablet  Commonly known as:  LASIX  Take 1 tablet (40 mg total) by mouth daily.     gabapentin 300 MG capsule  Commonly known as:  NEURONTIN  Take 1 capsule (300 mg total) by mouth 2 (two) times daily.     levETIRAcetam 500 MG tablet  Commonly known as:  KEPPRA  Take 1 tablet (500 mg total) by mouth 2 (two) times daily.     metFORMIN 500 MG 24 hr tablet  Commonly known as:  GLUCOPHAGE-XR  Take 500 mg by mouth daily  with breakfast.     metoprolol succinate 25 MG 24 hr tablet  Commonly known as:  TOPROL-XL  Take 25 mg by mouth daily.     Oxycodone HCl 20 MG Tabs  Take 20 mg by mouth every 4 (four) hours.     oxymorphone 40 MG 12 hr tablet  Commonly known as:  OPANA ER  Take 40 mg by mouth every 12 (twelve) hours.     polyethylene glycol packet  Commonly known as:  MIRALAX / GLYCOLAX  Take 17 g by mouth 2 (two) times daily.     potassium chloride SA 20 MEQ tablet  Commonly known as:  K-DUR,KLOR-CON  Take 2 tablets (40 mEq total) by mouth daily.     Vitamin D (Ergocalciferol) 50000 UNITS Caps capsule  Commonly known as:  DRISDOL  Take 1 capsule (50,000 Units total) by mouth every 7 (seven) days. Sunday     VITAMIN E PO  Take 1 capsule by mouth daily.        Discharge Assessment: Filed Vitals:   06/14/15 0546  BP:  133/84  Pulse: 92  Temp: 97.9 F (36.6 C)  Resp:    Skin clean, dry and intact without evidence of skin break down, no evidence of skin tears noted. IV catheter discontinued intact. Site without signs and symptoms of complications - no redness or edema noted at insertion site, patient denies c/o pain - only slight tenderness at site.  Dressing with slight pressure applied.  D/c Instructions-Education: Discharge instructions given to patient/family with verbalized understanding. D/c education completed with patient/family including follow up instructions, medication list, d/c activities limitations if indicated, with other d/c instructions as indicated by MD - patient able to verbalize understanding, all questions fully answered. Patient was ambulated earlier, and lowest saturation dropped to 89% on room air.  Patient instructed to return to ED, call 911, or call MD for any changes in condition.  Patient escorted via WC, and D/C home via private auto.  Kern Reap, RN 06/14/2015 12:42 PM

## 2015-06-14 NOTE — Progress Notes (Signed)
Utilization Review completed. Noelene Gang RN BSN CM 

## 2015-06-14 NOTE — Care Management Note (Signed)
Case Management Note  Patient Details  Name: Avonne Berkery MRN: [id number] Date of Birth: [birth year]  Subjective/Objective:                 Patient from home with spouse and family. Patient has home oxygen prior to admission. No additional home needs identified.   Action/Plan:  Patient to be discharged today to home with family, self care.   Expected Discharge Date:                  Expected Discharge Plan:  Home/Self Care  In-House Referral:     Discharge planning Services  CM Consult  Post Acute Care Choice:  NA Choice offered to:     DME Arranged:    DME Agency:     HH Arranged:    HH Agency:     Status of Service:  Completed, signed off  Medicare Important Message Given:    Date Medicare IM Given:    Medicare IM give by:    Date Additional Medicare IM Given:    Additional Medicare Important Message give by:     If discussed at Long Length of Stay Meetings, dates discussed:    Additional Comments:  Lawerance Sabal, RN 06/14/2015, 11:08 AM

## 2015-06-22 ENCOUNTER — Other Ambulatory Visit: Payer: Self-pay | Admitting: Internal Medicine

## 2015-07-11 IMAGING — CT CT T SPINE W/O CM
1 series · 12 of 14 positions shown, 15 images · non-contrast
Comparison: CT of the chest performed 01/25/2015

CLINICAL DATA: Status post fall, with upper back pain. Initial
encounter.

EXAM:
CT THORACIC SPINE WITHOUT CONTRAST
TECHNIQUE: Multidetector CT imaging of the thoracic spine was performed without
intravenous contrast administration. Multiplanar CT image
reconstructions were also generated.

[Series 4: spine 2.0 b31s st · axial · 0.33mm/px · z∈[-548,-316]mm · 12 of 138 slices shown, 15 images]
[im 11/138  soft-tissue]
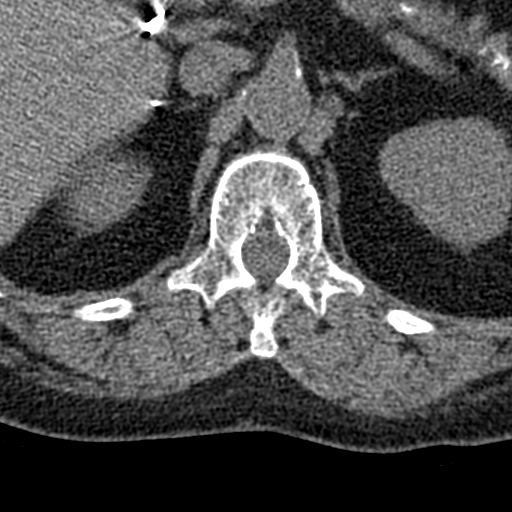
[im 11/138  bone]
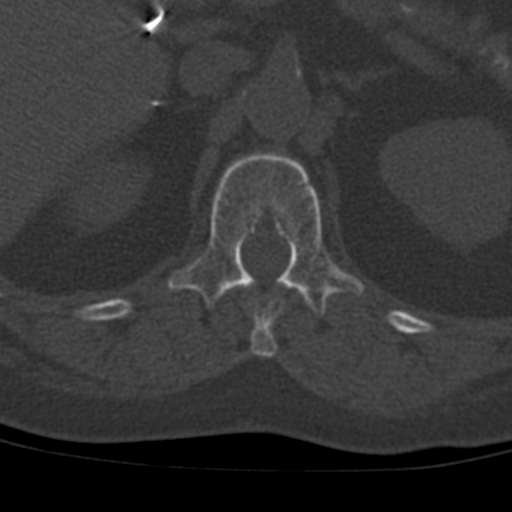
[im 22/138  bone]
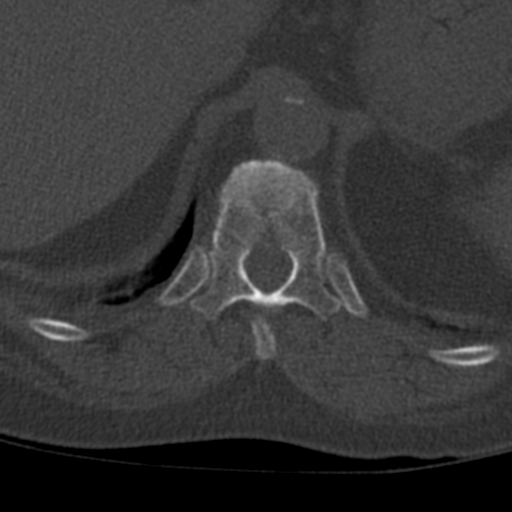
[im 32/138  bone]
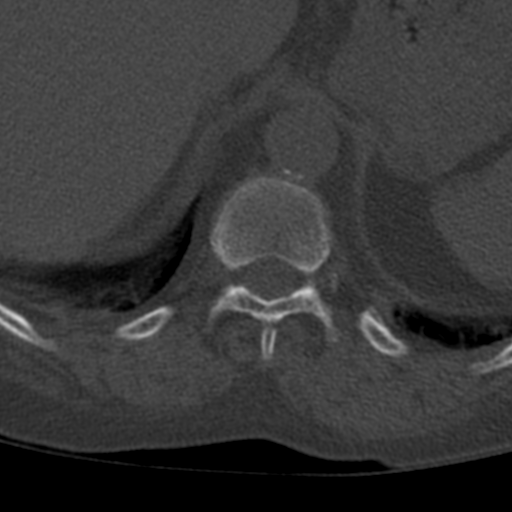
[im 43/138  bone]
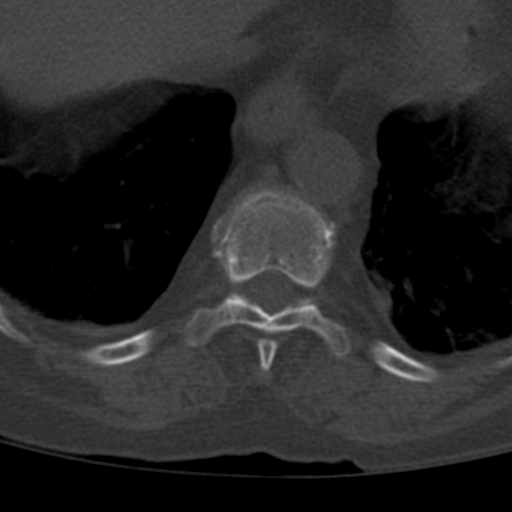
[im 53/138  soft-tissue]
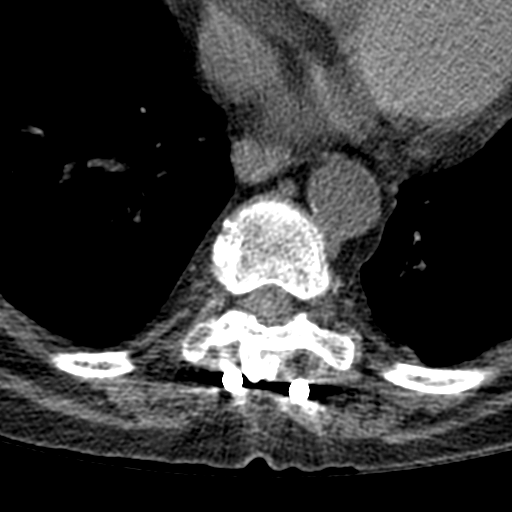
[im 53/138  bone]
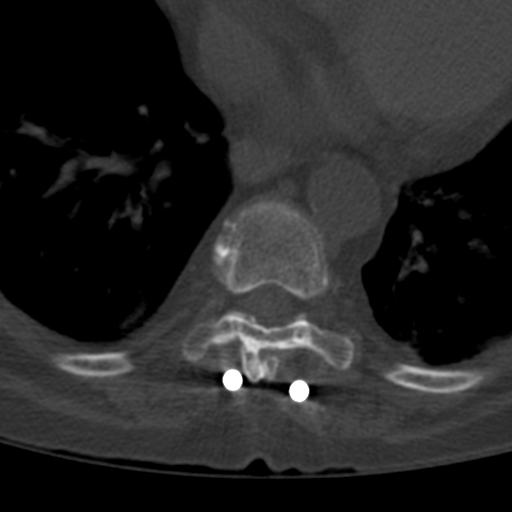
[im 64/138  bone]
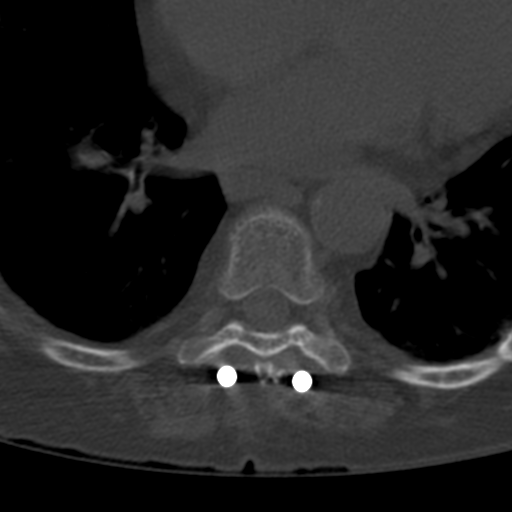
[im 74/138  bone]
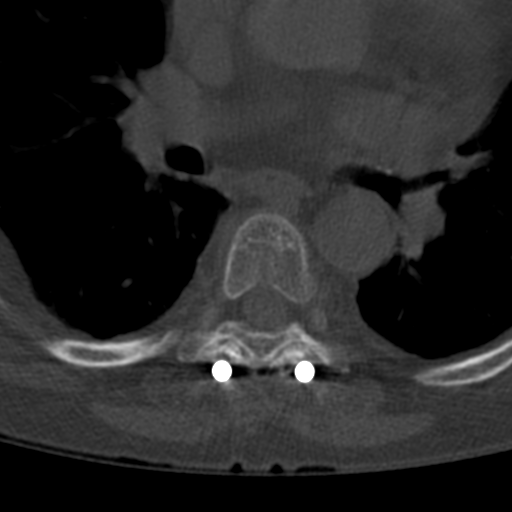
[im 85/138  bone]
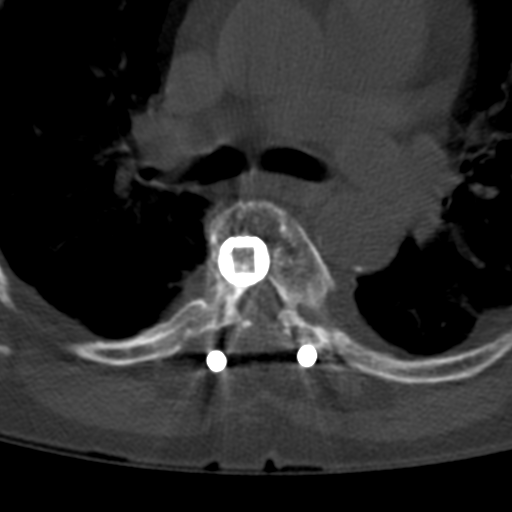
[im 95/138  soft-tissue]
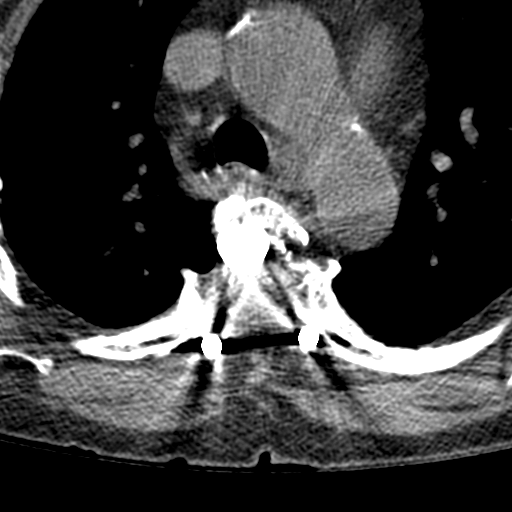
[im 95/138  bone]
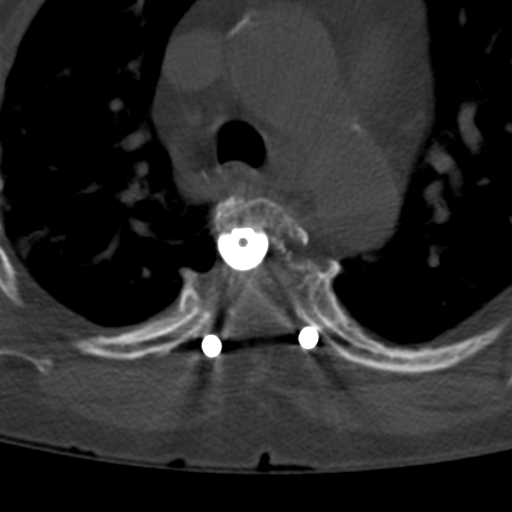
[im 106/138  bone]
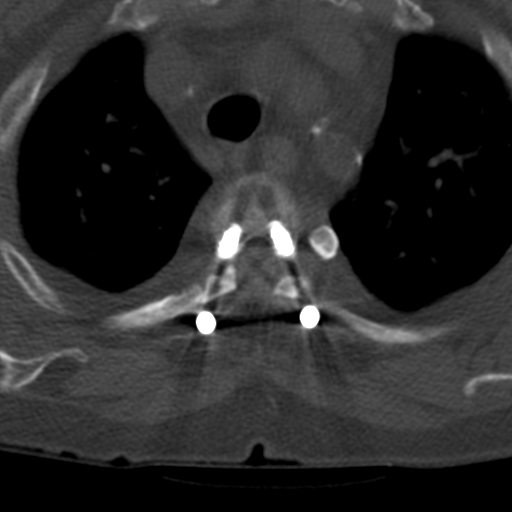
[im 116/138  bone]
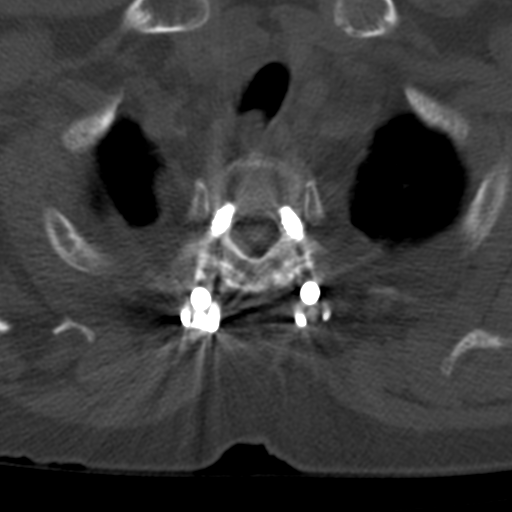
[im 127/138  bone]
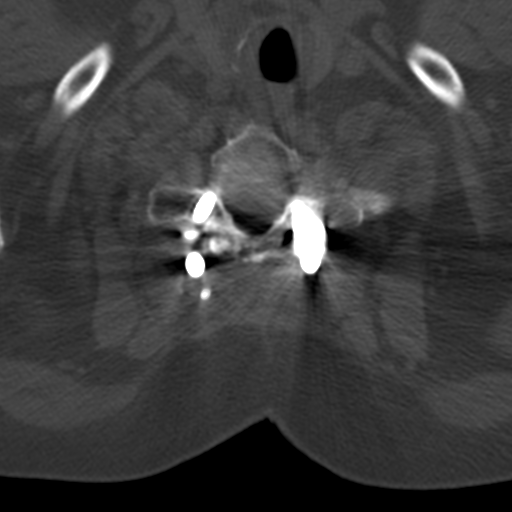

[12 of 14 positions shown; findings below may reference images not displayed]

FINDINGS: There is no evidence of acute fracture or subluxation along the
thoracic spine. The previously noted chronic compression deformity
at the lower thoracic spine turns out to be at vertebral body T11.

The patient is status post posterior thoracic spinal fusion
extending from T1 through T8, with decompression noted at T3-T5, and
associated prosthesis at T3-T5. No new compression deformities are
seen along the thoracic spine. Multilevel disc space narrowing is
noted along the lower cervical spine, with scattered small anterior
and posterior disc osteophyte complexes. Minimal vacuum phenomenon
is noted at multiple levels along the lower thoracic spine.

The visualized portions of the lungs are grossly clear, aside from
minimal left basilar atelectasis. Mildly prominent extrapleural fat
is noted bilaterally, with minimal pleural calcification noted near
the right lung apex. The visualized portions of mediastinum are
grossly unremarkable, aside from scattered coronary artery
calcification.

The paraspinal musculature appears grossly intact.
IMPRESSION: 1. No evidence of acute fracture or subluxation along the thoracic
spine.
2. Chronic compression deformity again noted at vertebral body T11.
3. Status post thoracic spinal fusion extending from T1-T8, with
decompression at T3-T5, and associated prosthesis.
4. Minimal degenerative change along the lower cervical spine.
5. Minimal left basilar atelectasis noted. Minimal pleural
calcification near the right lung apex.
6. Scattered coronary artery calcifications seen.

## 2015-07-11 IMAGING — CR DG THORACIC SPINE 2V
3 series · 3 of 3 positions shown · non-contrast
Comparison: 01/25/2015

CLINICAL DATA: Fall. Thoracic spine pain diffusely. Surgery 2 years
ago.

EXAM:
THORACIC SPINE - 2-3 VIEWS

[t t-spine a.p.]
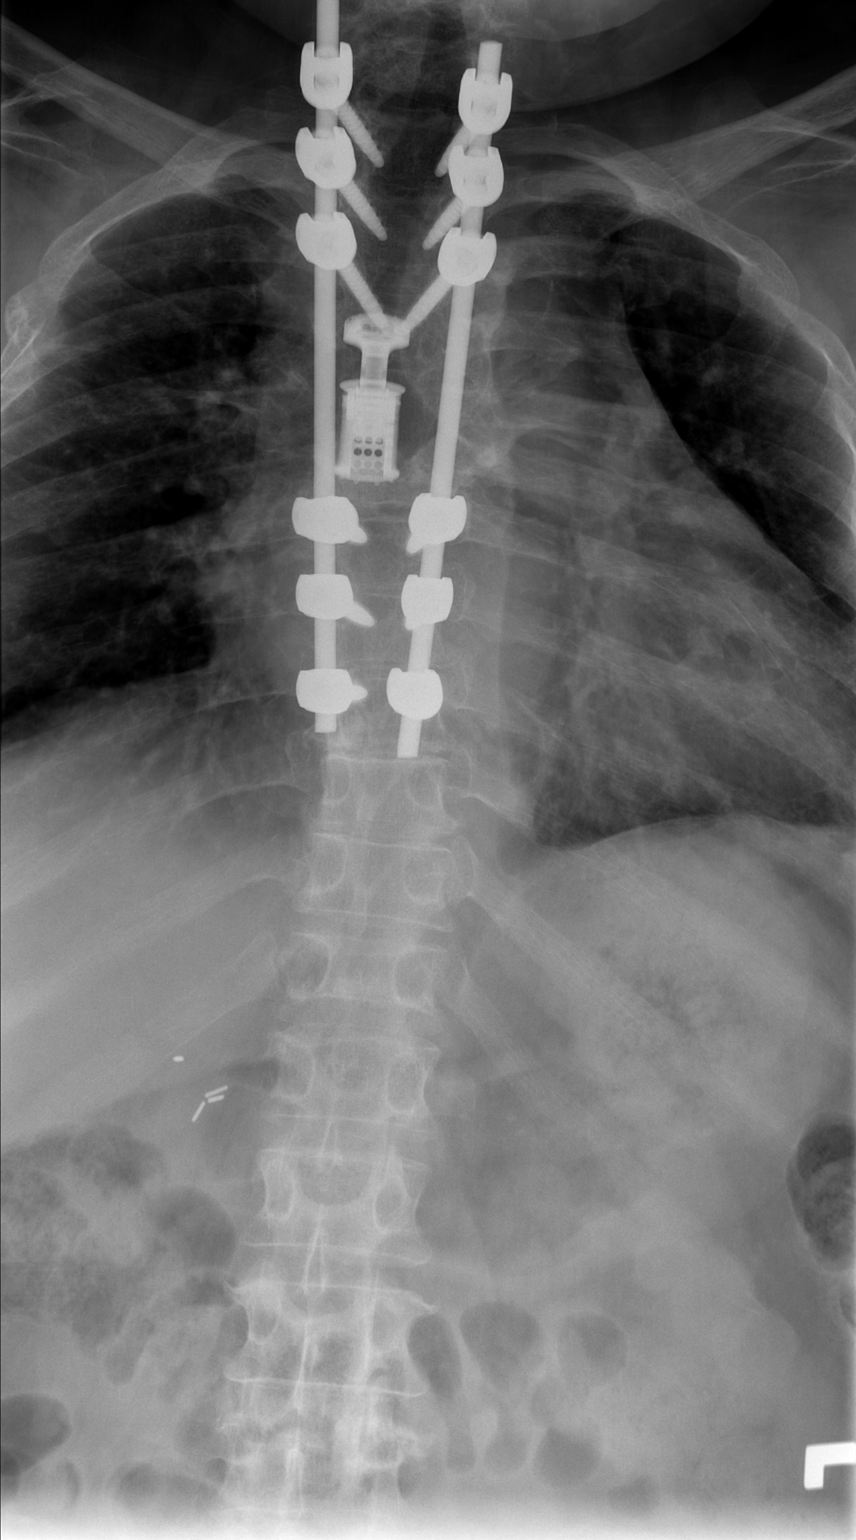

[t t-spine lat]
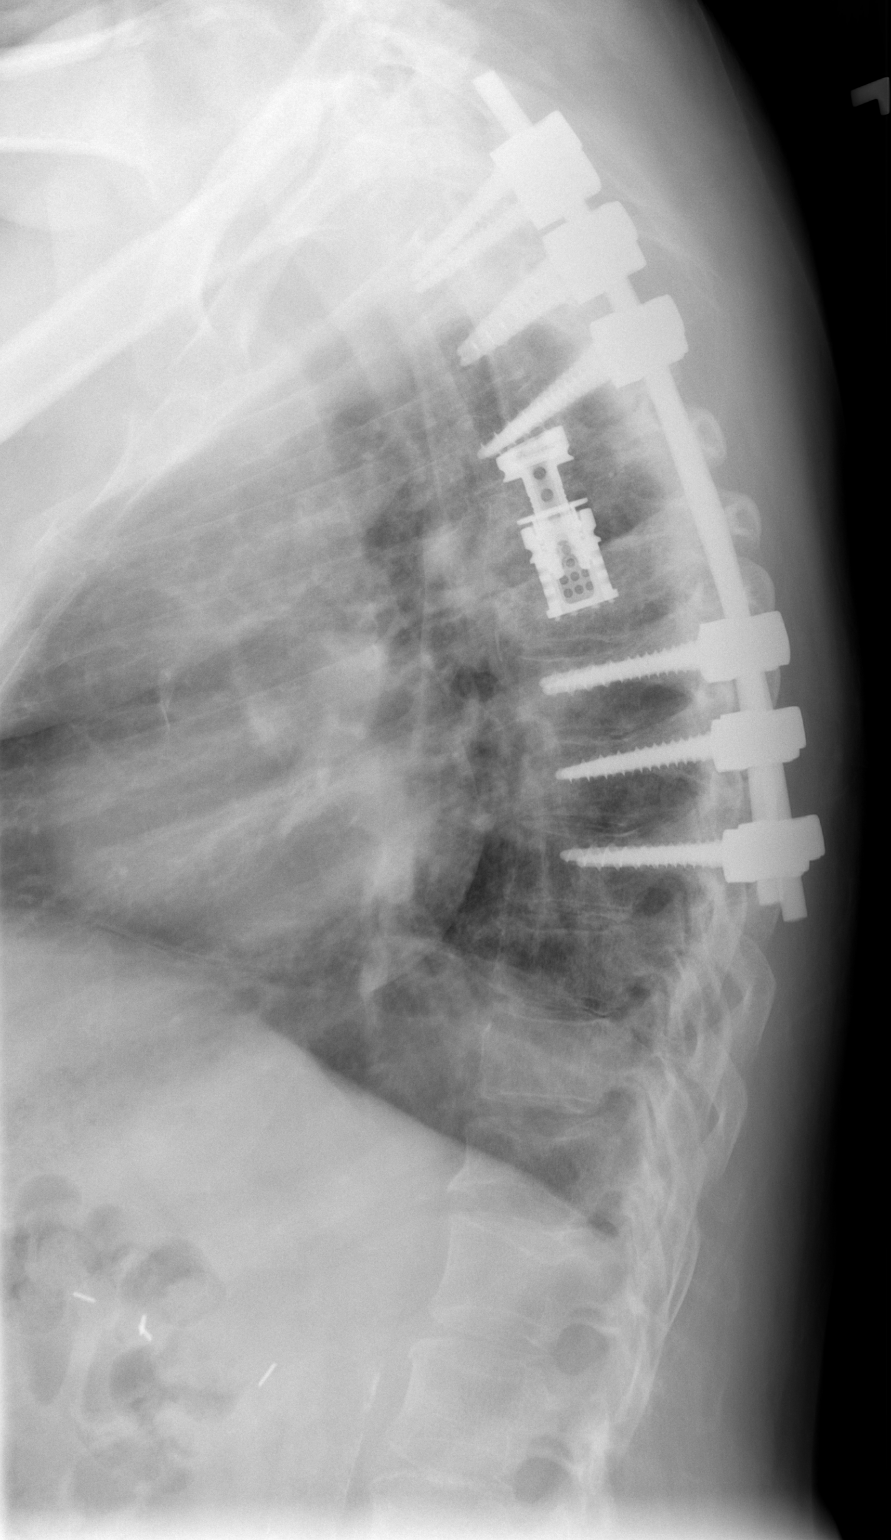

[t swimmers]
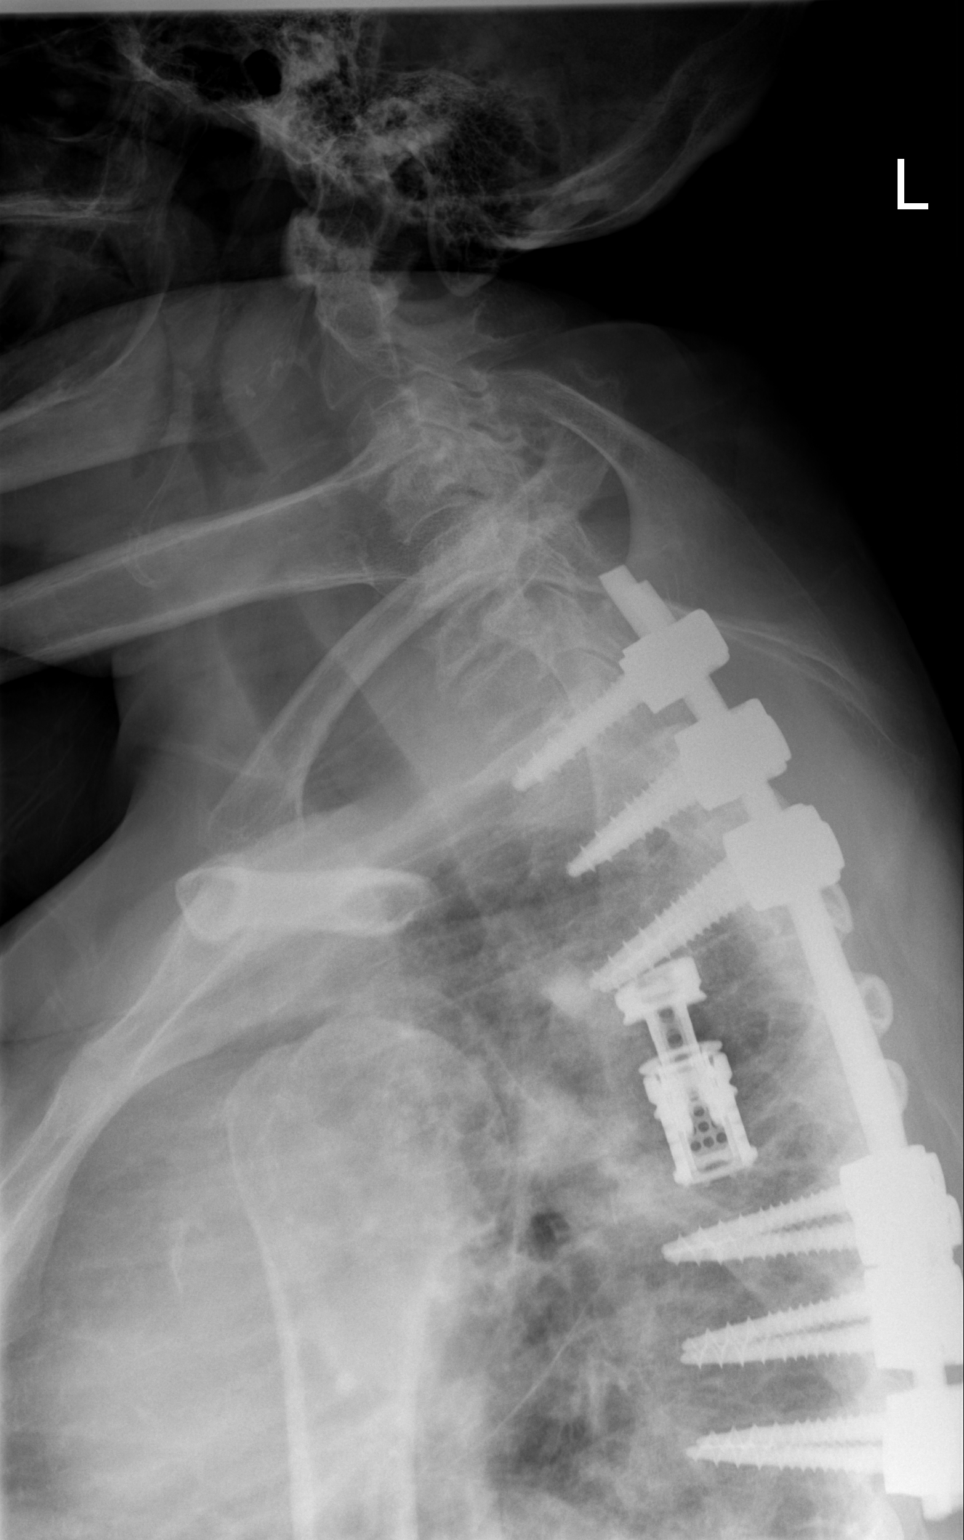

[3 of 3 positions shown; findings below may reference images not displayed]

FINDINGS: T5 corpectomy spacer observed extending up to involve part of the T4
vertebral body. Posterolateral rod and pedicle screw fixator noted,
not appreciably changed from 01/25/2015.

Right upper lateral rib deformities, chronic and stable. T12
compression fracture, chronic, stable. Considerable cervical
spondylosis noted with loss of vertebral body height at C3-4 and 4
mm anterolisthesis at C3-4. Questionable prevertebral soft tissue
swelling in the cervical spine.

The upper vertebral bodies have indistinct cortical margins. There
might be some superior endplate concavity at T3 which was not
present on 01/25/2015, although this is not entirely certain.
Questionable linear lucency along the left hemidiaphragm on the
lateral projection, probably from atelectasis.
IMPRESSION: 1. Questionable superior endplate concavity at T3 could conceivably
represent an interval fracture compared to 01/25/15. CT of the
thoracic spine would provide better definition.
2. Cervical spondylosis with 4 mm of anterolisthesis at C3-4, this
subluxation is age indeterminate. Consider dedicated imaging of the
cervical spine.
3. Prior corpectomy at T5 with spanning posterolateral rod and
pedicle screw fixation as before.
4. Faint linear opacity along one of the hemidiaphragms on the
lateral view, possibly the left, probably from atelectasis.
5. Old compression at T12.

## 2015-07-11 IMAGING — CR DG KNEE COMPLETE 4+V*R*
4 series · 4 of 4 positions shown · non-contrast
Comparison: None.

CLINICAL DATA: Status post fall, with right knee pain. Initial
encounter.

EXAM:
RIGHT KNEE - COMPLETE 4+ VIEW

[t knee ap right]
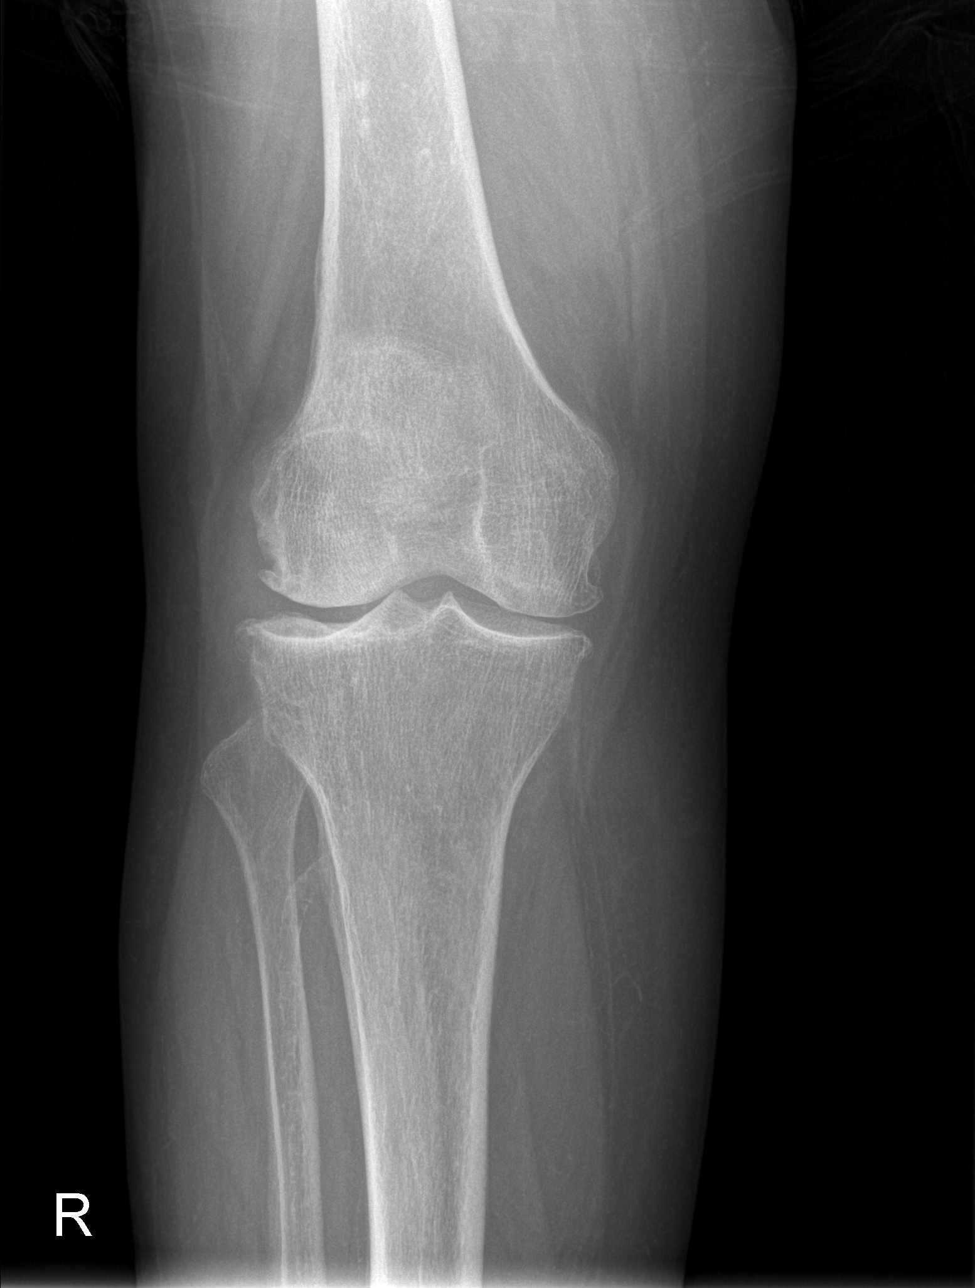

[t knee oblique right (1 of 2)]
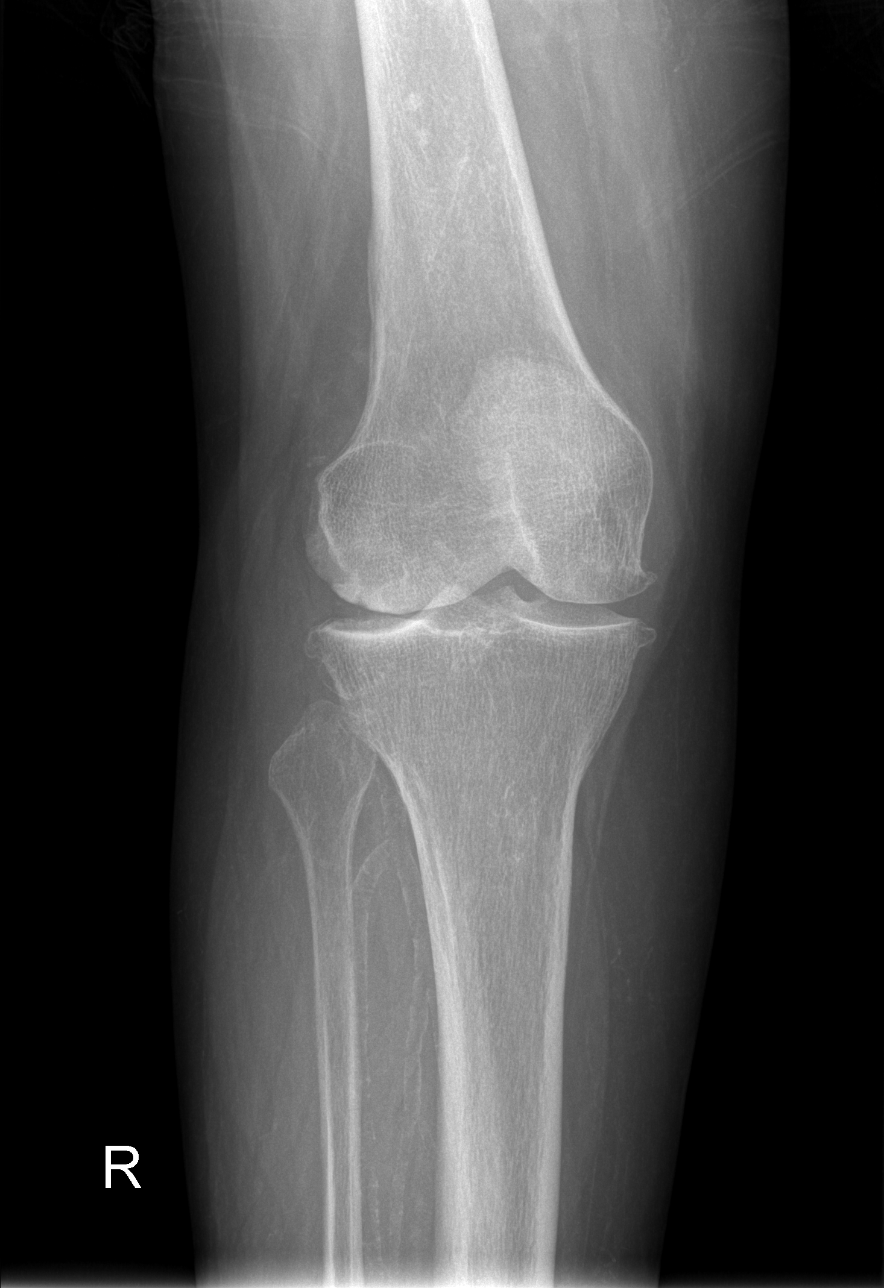

[t knee oblique right (2 of 2)]
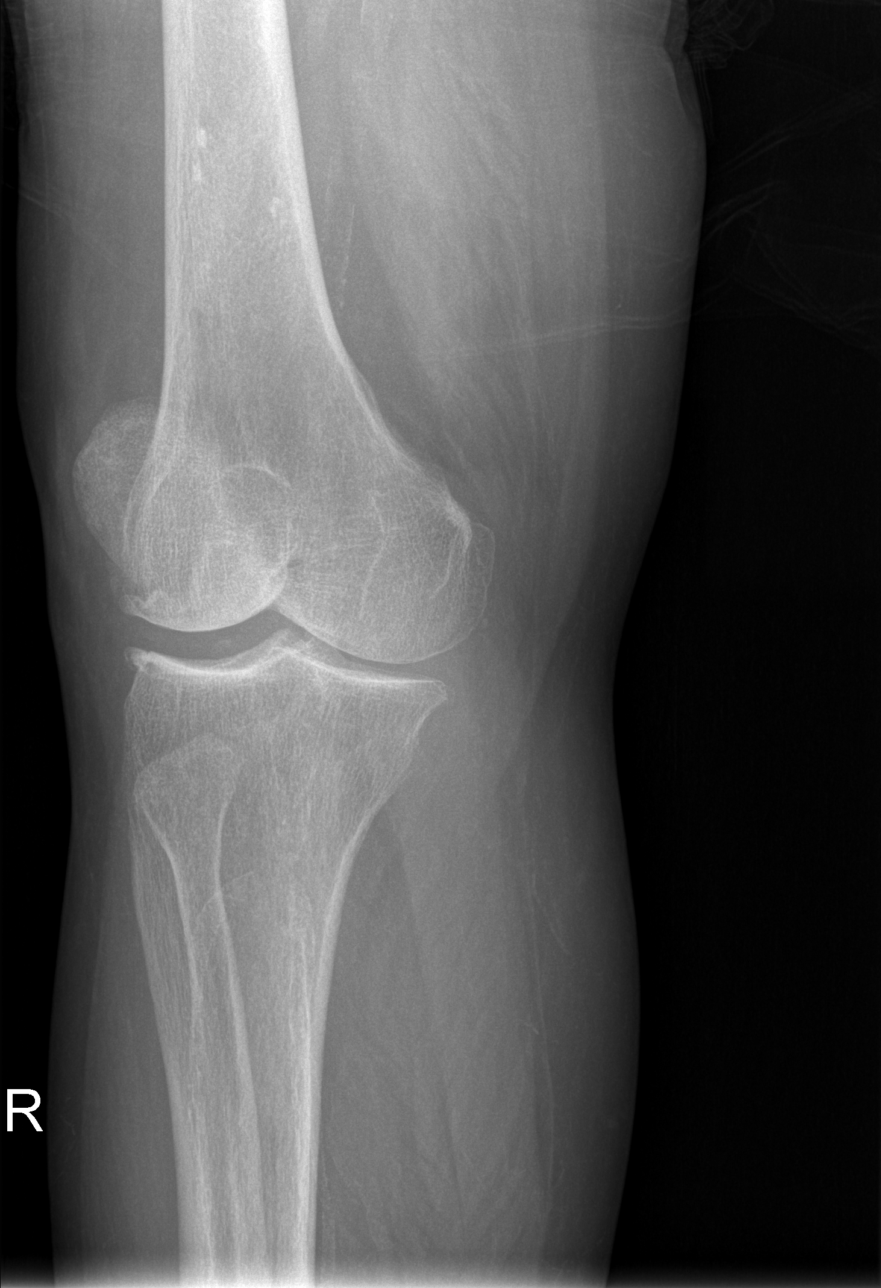

[t knee lat right]
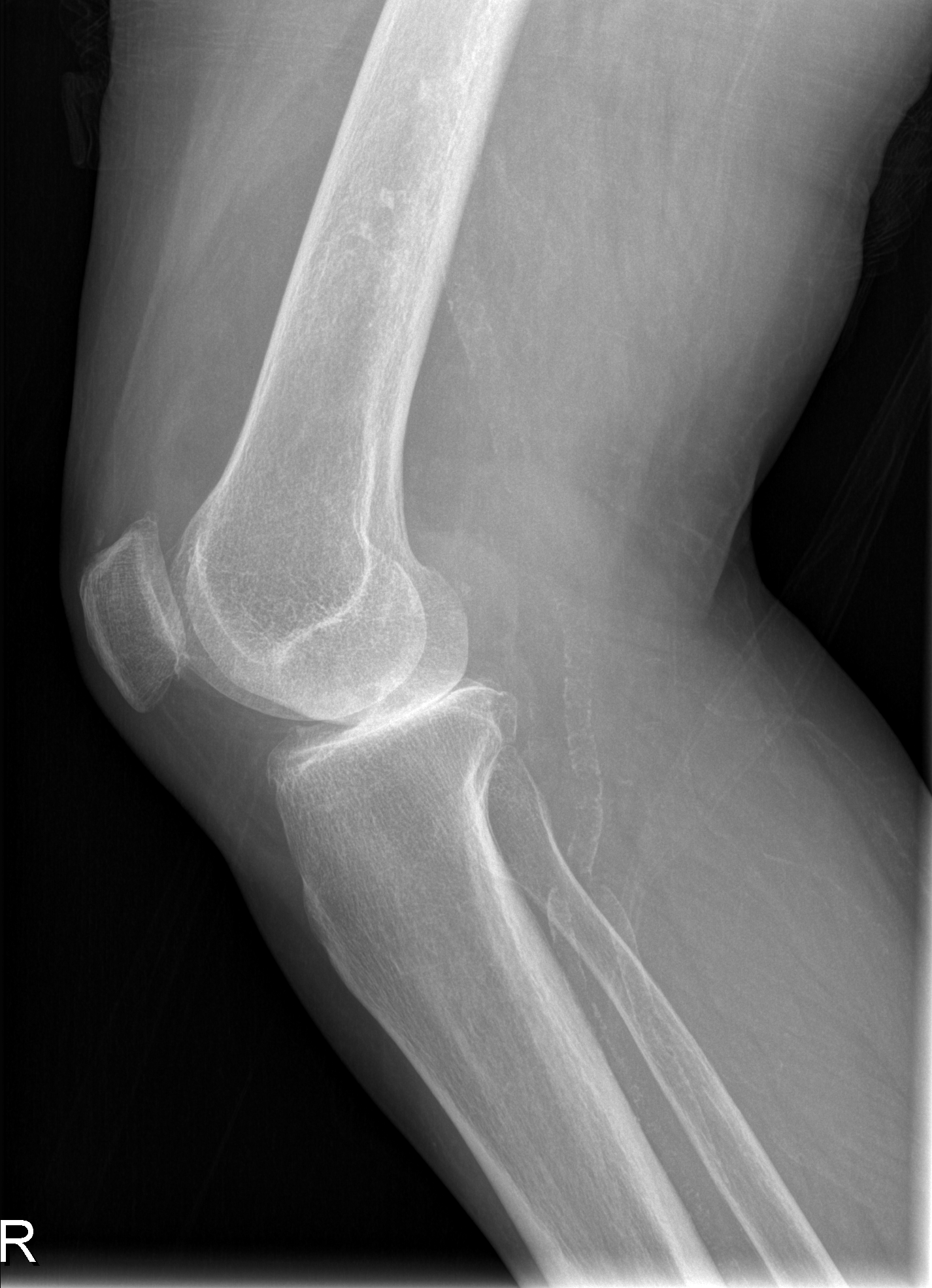

[4 of 4 positions shown; findings below may reference images not displayed]

FINDINGS: There is no evidence of fracture or dislocation. Marginal
osteophytes are seen arising at the medial and lateral compartments,
with sclerotic change at the lateral compartment and associated mild
cortical irregularity. The joint spaces are preserved. No
significant degenerative change is seen; the patellofemoral joint is
grossly unremarkable in appearance.

Trace knee joint fluid remains within normal limits. Diffuse
vascular calcifications are seen.
IMPRESSION: 1. No evidence of fracture or dislocation.
2. Mild osteoarthritis involving the medial and lateral
compartments, with mild cortical irregularity at the lateral
compartment.
3. Diffuse vascular calcifications seen.

## 2015-07-11 IMAGING — CT CT CERVICAL SPINE W/O CM
2 series · 10 of 14 positions shown, 12 images · non-contrast
Comparison: None.

CLINICAL DATA: Fall with neck pain and upper back pain. Initial
encounter.

EXAM:
CT CERVICAL SPINE WITHOUT CONTRAST
TECHNIQUE: Multidetector CT imaging of the cervical spine was performed without
intravenous contrast. Multiplanar CT image reconstructions were also
generated.

[Series 3: c_spine 2.0 b41s st · axial · 0.29mm/px · z∈[-322,-238]mm · 4 of 70 slices shown]
[im 14/70  bone]
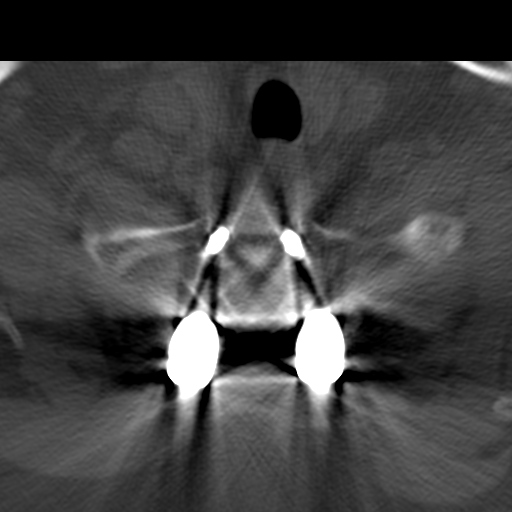
[im 28/70  bone]
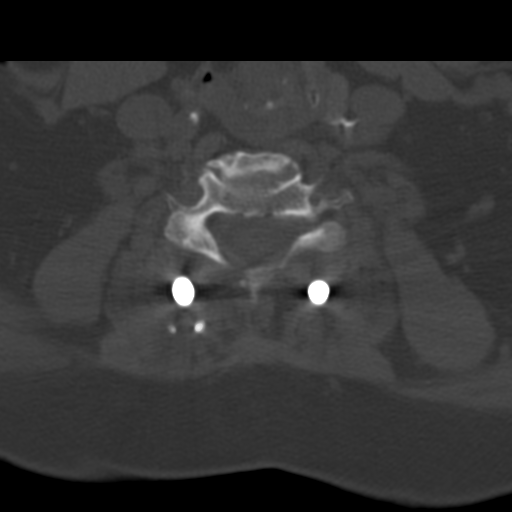
[im 42/70  bone]
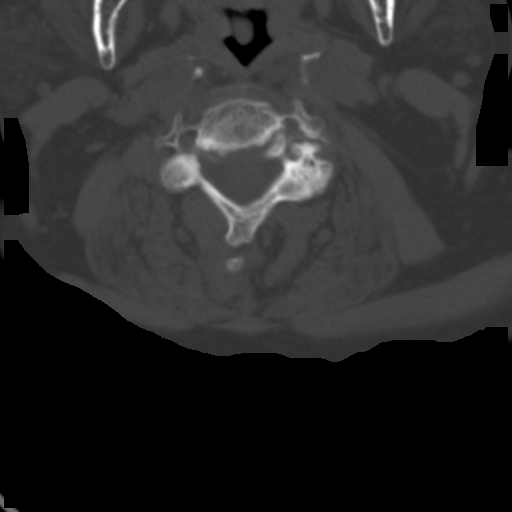
[im 56/70  bone]
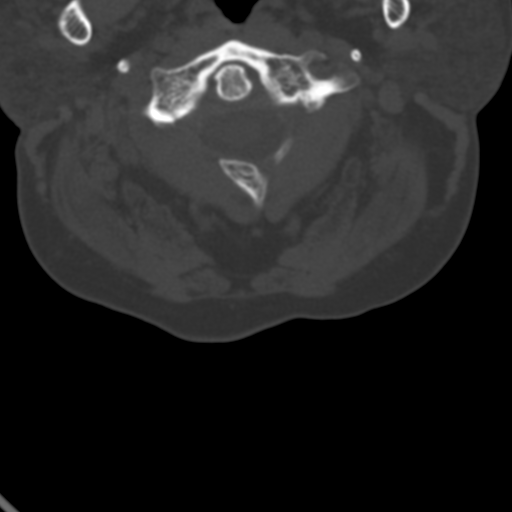

[Series 8: c_spine 2.0 orth ax · axial · 0.22mm/px · z∈[-358,-257]mm · 6 of 81 slices shown, 8 images]
[im 12/81  soft-tissue]
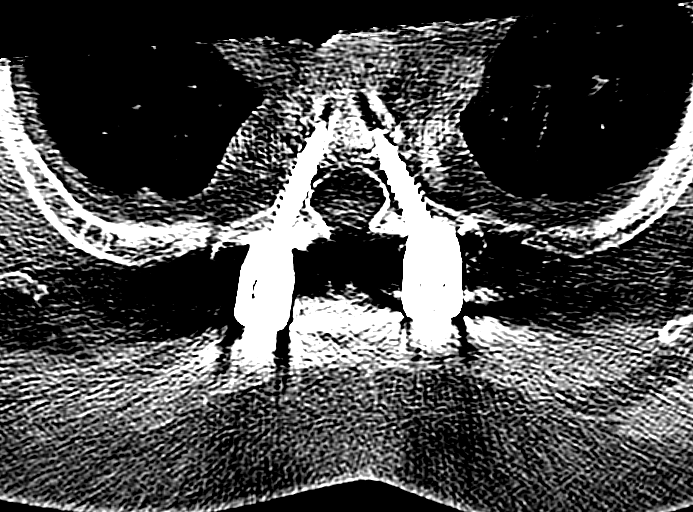
[im 12/81  bone]
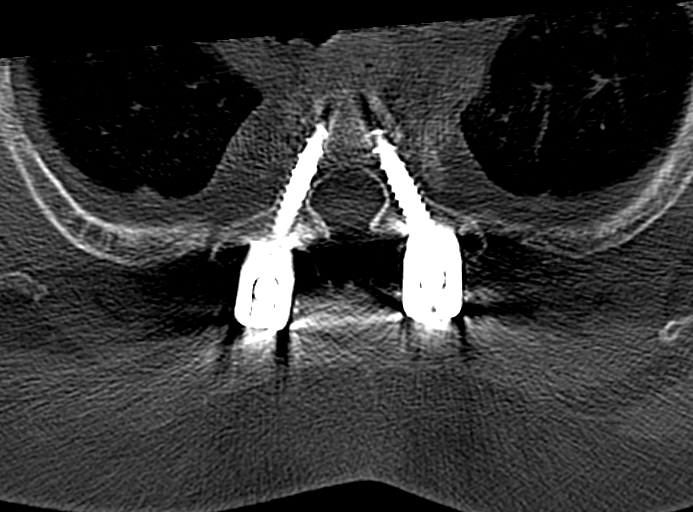
[im 23/81  bone]
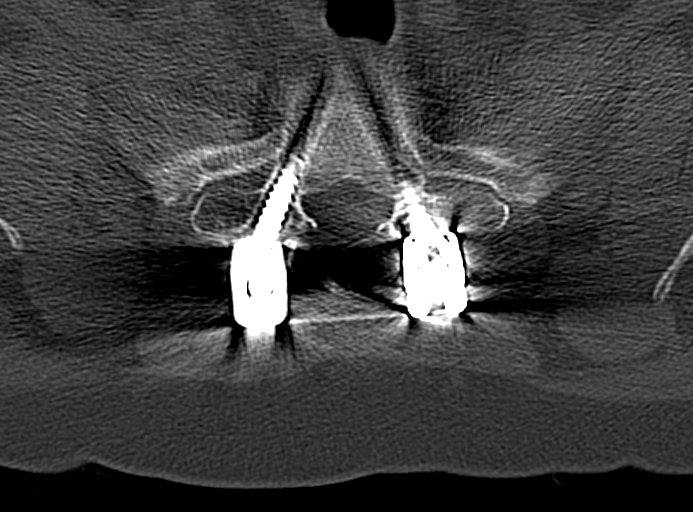
[im 35/81  bone]
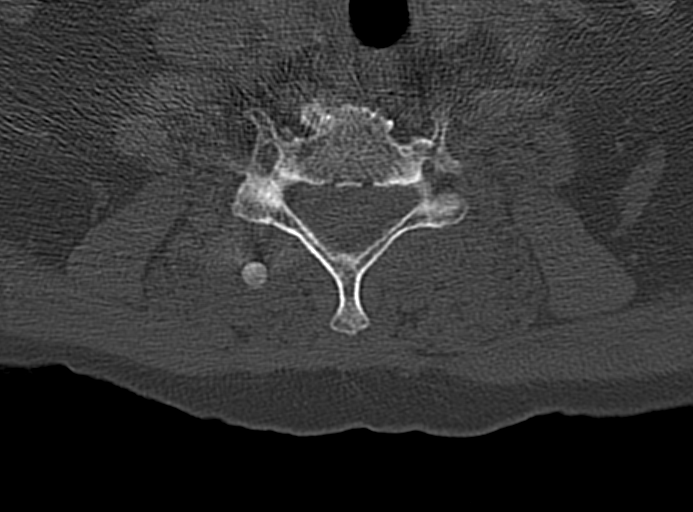
[im 46/81  bone]
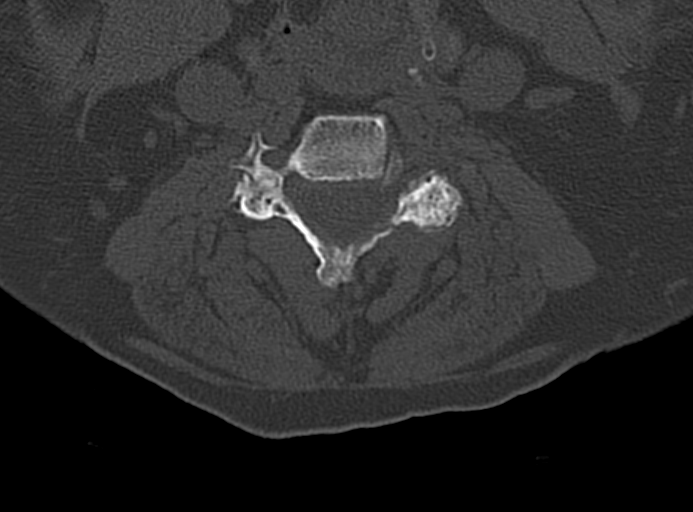
[im 58/81  soft-tissue]
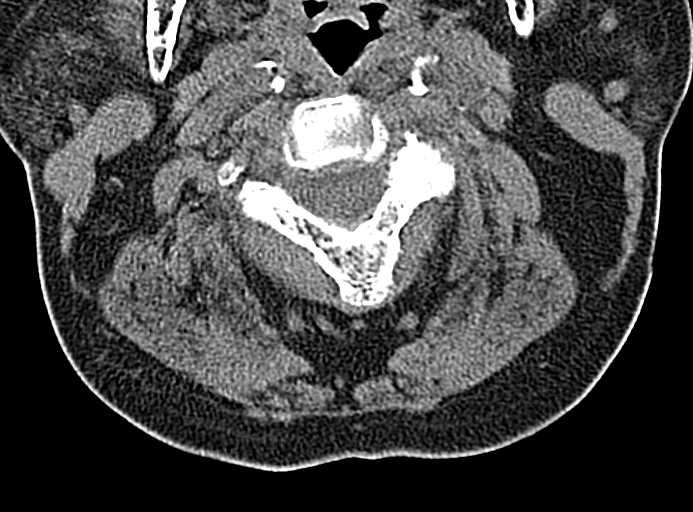
[im 58/81  bone]
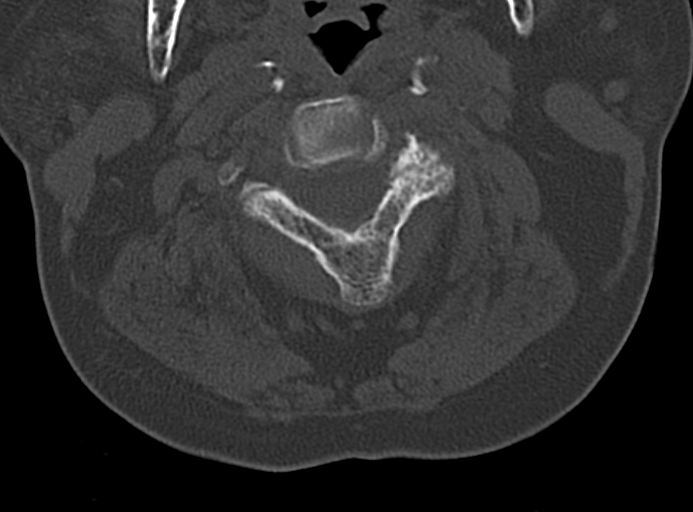
[im 69/81  bone]
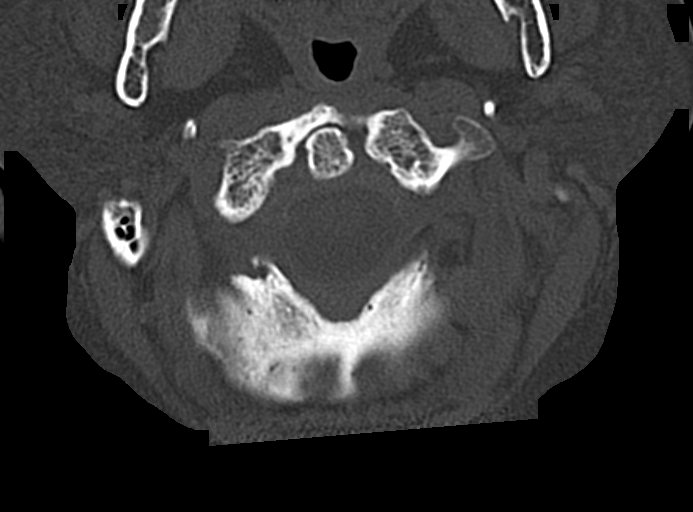

[10 of 14 positions shown; findings below may reference images not displayed]

FINDINGS: No evidence of acute fracture. No traumatic malalignment suspected.
No prevertebral edema or gross cervical canal hematoma.

Incomplete ossification of the posterior arch of C1 with hypertrophy
of the anterior arch. The rudimentary posterior arch of C1 is non
segmented from the C2 posterior ring.

There is multilevel degenerative disc narrowing and endplate
spurring. Facet arthropathy, greatest at C3-4 with 3 mm of
anterolisthesis and bilateral foraminal stenosis. Posterior rod and
pedicle screw fixation in the upper thoracic spine, without
complication at the imaged levels. There is contemporaneous
dedicated thoracic spine imaging.
IMPRESSION: 1. No acute finding.
2. Multilevel degenerative disc and facet disease. Facet arthropathy
is greatest at C3-4 where there is anterolisthesis and bilateral
foraminal stenosis.

## 2015-07-26 ENCOUNTER — Emergency Department (HOSPITAL_BASED_OUTPATIENT_CLINIC_OR_DEPARTMENT_OTHER): Payer: Medicaid Other

## 2015-07-26 ENCOUNTER — Encounter (HOSPITAL_BASED_OUTPATIENT_CLINIC_OR_DEPARTMENT_OTHER): Payer: Self-pay | Admitting: Emergency Medicine

## 2015-07-26 ENCOUNTER — Inpatient Hospital Stay (HOSPITAL_BASED_OUTPATIENT_CLINIC_OR_DEPARTMENT_OTHER)
Admission: EM | Admit: 2015-07-26 | Discharge: 2015-08-02 | DRG: 193 | Disposition: A | Discharge status: Home / Self Care | Payer: Medicaid Other | Attending: Internal Medicine | Admitting: Internal Medicine

## 2015-07-26 DIAGNOSIS — I272 Other secondary pulmonary hypertension: Secondary | ICD-10-CM | POA: Diagnosis present

## 2015-07-26 DIAGNOSIS — J45909 Unspecified asthma, uncomplicated: Secondary | ICD-10-CM | POA: Diagnosis present

## 2015-07-26 DIAGNOSIS — E119 Type 2 diabetes mellitus without complications: Secondary | ICD-10-CM | POA: Diagnosis present

## 2015-07-26 DIAGNOSIS — Z7982 Long term (current) use of aspirin: Secondary | ICD-10-CM | POA: Diagnosis not present

## 2015-07-26 DIAGNOSIS — M109 Gout, unspecified: Secondary | ICD-10-CM | POA: Diagnosis present

## 2015-07-26 DIAGNOSIS — W19XXXA Unspecified fall, initial encounter: Secondary | ICD-10-CM | POA: Diagnosis present

## 2015-07-26 DIAGNOSIS — Z79891 Long term (current) use of opiate analgesic: Secondary | ICD-10-CM | POA: Diagnosis not present

## 2015-07-26 DIAGNOSIS — R296 Repeated falls: Secondary | ICD-10-CM | POA: Diagnosis present

## 2015-07-26 DIAGNOSIS — D509 Iron deficiency anemia, unspecified: Secondary | ICD-10-CM | POA: Diagnosis present

## 2015-07-26 DIAGNOSIS — J9600 Acute respiratory failure, unspecified whether with hypoxia or hypercapnia: Secondary | ICD-10-CM | POA: Diagnosis present

## 2015-07-26 DIAGNOSIS — I248 Other forms of acute ischemic heart disease: Secondary | ICD-10-CM | POA: Diagnosis present

## 2015-07-26 DIAGNOSIS — J9601 Acute respiratory failure with hypoxia: Secondary | ICD-10-CM | POA: Diagnosis present

## 2015-07-26 DIAGNOSIS — K219 Gastro-esophageal reflux disease without esophagitis: Secondary | ICD-10-CM | POA: Diagnosis present

## 2015-07-26 DIAGNOSIS — Z79899 Other long term (current) drug therapy: Secondary | ICD-10-CM | POA: Diagnosis not present

## 2015-07-26 DIAGNOSIS — Z91041 Radiographic dye allergy status: Secondary | ICD-10-CM | POA: Diagnosis not present

## 2015-07-26 DIAGNOSIS — I119 Hypertensive heart disease without heart failure: Secondary | ICD-10-CM | POA: Diagnosis present

## 2015-07-26 DIAGNOSIS — M549 Dorsalgia, unspecified: Secondary | ICD-10-CM | POA: Diagnosis not present

## 2015-07-26 DIAGNOSIS — G8929 Other chronic pain: Secondary | ICD-10-CM | POA: Diagnosis present

## 2015-07-26 DIAGNOSIS — Z8249 Family history of ischemic heart disease and other diseases of the circulatory system: Secondary | ICD-10-CM | POA: Diagnosis not present

## 2015-07-26 DIAGNOSIS — R079 Chest pain, unspecified: Secondary | ICD-10-CM | POA: Diagnosis present

## 2015-07-26 DIAGNOSIS — R1013 Epigastric pain: Secondary | ICD-10-CM

## 2015-07-26 DIAGNOSIS — J189 Pneumonia, unspecified organism: Principal | ICD-10-CM | POA: Diagnosis present

## 2015-07-26 DIAGNOSIS — R0602 Shortness of breath: Secondary | ICD-10-CM

## 2015-07-26 DIAGNOSIS — M1990 Unspecified osteoarthritis, unspecified site: Secondary | ICD-10-CM | POA: Diagnosis present

## 2015-07-26 DIAGNOSIS — I10 Essential (primary) hypertension: Secondary | ICD-10-CM | POA: Diagnosis present

## 2015-07-26 DIAGNOSIS — Z87898 Personal history of other specified conditions: Secondary | ICD-10-CM

## 2015-07-26 DIAGNOSIS — Z981 Arthrodesis status: Secondary | ICD-10-CM | POA: Diagnosis not present

## 2015-07-26 DIAGNOSIS — E118 Type 2 diabetes mellitus with unspecified complications: Secondary | ICD-10-CM | POA: Diagnosis present

## 2015-07-26 DIAGNOSIS — Z87891 Personal history of nicotine dependence: Secondary | ICD-10-CM

## 2015-07-26 LAB — CBC
HEMATOCRIT: 34.7 % — AB (ref 36.0–46.0)
HEMOGLOBIN: 11.3 g/dL — AB (ref 12.0–15.0)
MCH: 24.9 pg — ABNORMAL LOW (ref 26.0–34.0)
MCHC: 32.6 g/dL (ref 30.0–36.0)
MCV: 76.6 fL — ABNORMAL LOW (ref 78.0–100.0)
Platelets: 336 10*3/uL (ref 150–400)
RBC: 4.53 MIL/uL (ref 3.87–5.11)
RDW: 14.8 % (ref 11.5–15.5)
WBC: 11.7 10*3/uL — AB (ref 4.0–10.5)

## 2015-07-26 LAB — COMPREHENSIVE METABOLIC PANEL
ALBUMIN: 2.8 g/dL — AB (ref 3.5–5.0)
ALK PHOS: 131 U/L — AB (ref 38–126)
ALT: 27 U/L (ref 14–54)
AST: 31 U/L (ref 15–41)
Anion gap: 10 (ref 5–15)
BILIRUBIN TOTAL: 0.7 mg/dL (ref 0.3–1.2)
BUN: 13 mg/dL (ref 6–20)
CO2: 23 mmol/L (ref 22–32)
CREATININE: 0.9 mg/dL (ref 0.44–1.00)
Calcium: 9.2 mg/dL (ref 8.9–10.3)
Chloride: 103 mmol/L (ref 101–111)
GFR calc Af Amer: >60 mL/min (ref >60)
GFR calc non Af Amer: >60 mL/min (ref >60)
GLUCOSE: 201 mg/dL — AB (ref 65–99)
POTASSIUM: 4.2 mmol/L (ref 3.5–5.1)
Sodium: 136 mmol/L (ref 135–145)
TOTAL PROTEIN: 7.7 g/dL (ref 6.5–8.1)

## 2015-07-26 LAB — I-STAT ARTERIAL BLOOD GAS, ED
Acid-base deficit: 2 mmol/L (ref 0.0–2.0)
BICARBONATE: 20.3 meq/L (ref 20.0–24.0)
O2 Saturation: 97 %
TCO2: 21 mmol/L (ref 0–100)
pCO2 arterial: 25.8 mmHg — ABNORMAL LOW (ref 35.0–45.0)
pH, Arterial: 7.505 — ABNORMAL HIGH (ref 7.350–7.450)
pO2, Arterial: 77 mmHg — ABNORMAL LOW (ref 80.0–100.0)

## 2015-07-26 LAB — TROPONIN I: Troponin I: 0.04 ng/mL — ABNORMAL HIGH (ref <0.031)

## 2015-07-26 LAB — BRAIN NATRIURETIC PEPTIDE: B NATRIURETIC PEPTIDE 5: 37.8 pg/mL (ref 0.0–100.0)

## 2015-07-26 LAB — I-STAT CG4 LACTIC ACID, ED: Lactic Acid, Venous: 1.52 mmol/L (ref 0.5–2.0)

## 2015-07-26 LAB — D-DIMER, QUANTITATIVE: D-Dimer, Quant: 2.61 ug/mL-FEU — ABNORMAL HIGH (ref 0.00–0.48)

## 2015-07-26 MED ORDER — HEPARIN (PORCINE) IN NACL 100-0.45 UNIT/ML-% IJ SOLN
1000.0000 [IU]/h | INTRAMUSCULAR | Status: DC
  Administered 2015-07-27: 1000 [IU]/h via INTRAVENOUS
  Filled 2015-07-26: qty 250

## 2015-07-26 MED ORDER — AZITHROMYCIN 500 MG IV SOLR
INTRAVENOUS | Status: AC
  Filled 2015-07-26: qty 500

## 2015-07-26 MED ORDER — SODIUM CHLORIDE 0.9 % IV SOLN: INTRAVENOUS | Status: DC

## 2015-07-26 MED ORDER — METHYLPREDNISOLONE SODIUM SUCC 125 MG IJ SOLR
80.0000 mg | Freq: Once | INTRAMUSCULAR | Status: AC
  Administered 2015-07-26: 80 mg via INTRAVENOUS
  Filled 2015-07-26: qty 2

## 2015-07-26 MED ORDER — IPRATROPIUM-ALBUTEROL 0.5-2.5 (3) MG/3ML IN SOLN
3.0000 mL | Freq: Four times a day (QID) | RESPIRATORY_TRACT | Status: DC
  Administered 2015-07-26: 3 mL via RESPIRATORY_TRACT
  Filled 2015-07-26: qty 3

## 2015-07-26 MED ORDER — HYDROMORPHONE HCL 1 MG/ML IJ SOLN
1.0000 mg | Freq: Once | INTRAMUSCULAR | Status: AC
  Administered 2015-07-26: 1 mg via INTRAVENOUS
  Filled 2015-07-26: qty 1

## 2015-07-26 MED ORDER — SODIUM CHLORIDE 0.9 % IV BOLUS (SEPSIS)
1000.0000 mL | Freq: Once | INTRAVENOUS | Status: AC
Start: 2015-07-26 — End: 2015-07-27
  Administered 2015-07-26: 1000 mL via INTRAVENOUS

## 2015-07-26 MED ORDER — SODIUM CHLORIDE 0.9 % IV BOLUS (SEPSIS)
1000.0000 mL | Freq: Once | INTRAVENOUS | Status: AC
  Administered 2015-07-26: 1000 mL via INTRAVENOUS

## 2015-07-26 MED ORDER — PNEUMOCOCCAL VAC POLYVALENT 25 MCG/0.5ML IJ INJ
0.5000 mL | INJECTION | INTRAMUSCULAR | Status: AC
  Administered 2015-07-27: 0.5 mL via INTRAMUSCULAR
  Filled 2015-07-26 (×2): qty 0.5

## 2015-07-26 MED ORDER — CEFTRIAXONE SODIUM 1 G IJ SOLR
INTRAMUSCULAR | Status: AC
  Filled 2015-07-26: qty 10

## 2015-07-26 MED ORDER — DEXTROSE 5 % IV SOLN
500.0000 mg | Freq: Once | INTRAVENOUS | Status: AC
  Administered 2015-07-26: 500 mg via INTRAVENOUS

## 2015-07-26 MED ORDER — HEPARIN BOLUS VIA INFUSION
3000.0000 [IU] | Freq: Once | INTRAVENOUS | Status: AC
  Administered 2015-07-27: 3000 [IU] via INTRAVENOUS

## 2015-07-26 MED ORDER — INFLUENZA VAC SPLIT QUAD 0.5 ML IM SUSY
0.5000 mL | PREFILLED_SYRINGE | INTRAMUSCULAR | Status: AC
  Administered 2015-07-27: 0.5 mL via INTRAMUSCULAR
  Filled 2015-07-26: qty 0.5

## 2015-07-26 MED ORDER — DEXTROSE 5 % IV SOLN
1.0000 g | Freq: Once | INTRAVENOUS | Status: AC
  Administered 2015-07-26: 1 g via INTRAVENOUS

## 2015-07-26 MED ORDER — SODIUM CHLORIDE 0.9 % IV SOLN: Freq: Once | INTRAVENOUS | Status: DC

## 2015-07-26 MED ORDER — IPRATROPIUM-ALBUTEROL 0.5-2.5 (3) MG/3ML IN SOLN
RESPIRATORY_TRACT | Status: AC
  Administered 2015-07-26: 3 mL
  Filled 2015-07-26: qty 3

## 2015-07-26 NOTE — Progress Notes (Signed)
ANTICOAGULATION CONSULT NOTE - Initial Consult  Pharmacy Consult for Heparin Indication: r/o PE  Allergies  Allergen Reactions  . Ivp Dye [Iodinated Diagnostic Agents] Itching    Patient Measurements: Height: 5' (152.4 cm) Weight: 139 lb 5.3 oz (63.2 kg) IBW/kg (Calculated) : 45.5  Vital Signs: Temp: 99.4 F (37.4 C) (09/05 1918) BP: 147/96 mmHg (09/05 2158) Pulse Rate: 100 (09/05 2158)  Labs:  Recent Labs  07/26/15 1931  HGB 11.3*  HCT 34.7*  PLT 336  CREATININE 0.90  TROPONINI 0.04*    Estimated Creatinine Clearance: 57.3 mL/min (by C-G formula based on Cr of 0.9).   Medical History: Past Medical History  Diagnosis Date  . Arthritis   . Asthma   . Hypertension   . Constipation   . GERD (gastroesophageal reflux disease)   . Diabetes mellitus without complication   . Chronic pain   . Seizures     last seizure March 2015    Medications:  Prescriptions prior to admission  Medication Sig Dispense Refill Last Dose  . albuterol (PROVENTIL HFA;VENTOLIN HFA) 108 (90 BASE) MCG/ACT inhaler Inhale 2 puffs into the lungs every 6 (six) hours as needed for wheezing or shortness of breath.   06/11/2015 at Unknown time  . allopurinol (ZYLOPRIM) 300 MG tablet Take 300 mg by mouth daily.   06/11/2015 at Unknown time  . amLODipine (NORVASC) 5 MG tablet Take 5 mg by mouth daily.   06/11/2015 at Unknown time  . aspirin EC 81 MG tablet Take 1 tablet (81 mg total) by mouth daily. 30 tablet 2   . diazepam (VALIUM) 5 MG tablet Take 1 tablet (5 mg total) by mouth every 12 (twelve) hours as needed for muscle spasms. (Patient not taking: Reported on 06/12/2015) 10 tablet 0   . esomeprazole (NEXIUM) 40 MG capsule Take 40 mg by mouth daily at 12 noon.   06/11/2015 at Unknown time  . Fluticasone-Salmeterol (ADVAIR) 250-50 MCG/DOSE AEPB Inhale 1 puff into the lungs 2 (two) times daily.   06/11/2015 at Unknown time  . folic acid (FOLVITE) 1 MG tablet Take 1 mg by mouth daily.   06/11/2015 at  Unknown time  . furosemide (LASIX) 40 MG tablet Take 1 tablet (40 mg total) by mouth daily. 30 tablet 1   . gabapentin (NEURONTIN) 300 MG capsule Take 1 capsule (300 mg total) by mouth 2 (two) times daily. 60 capsule 0   . levETIRAcetam (KEPPRA) 500 MG tablet Take 1 tablet (500 mg total) by mouth 2 (two) times daily. 60 tablet 0 last month  . metFORMIN (GLUCOPHAGE-XR) 500 MG 24 hr tablet Take 500 mg by mouth daily with breakfast.   06/11/2015 at Unknown time  . metoprolol succinate (TOPROL-XL) 25 MG 24 hr tablet Take 25 mg by mouth daily.   06/11/2015 at 0830  . Oxycodone HCl 20 MG TABS Take 20 mg by mouth every 4 (four) hours.   06/11/2015 at Unknown time  . oxymorphone (OPANA ER) 40 MG 12 hr tablet Take 40 mg by mouth every 12 (twelve) hours.   06/11/2015 at Unknown time  . polyethylene glycol (MIRALAX / GLYCOLAX) packet Take 17 g by mouth 2 (two) times daily. 14 each 0   . potassium chloride SA (K-DUR,KLOR-CON) 20 MEQ tablet Take 2 tablets (40 mEq total) by mouth daily. 30 tablet 0 06/11/2015 at Unknown time  . Vitamin D, Ergocalciferol, (DRISDOL) 50000 UNITS CAPS capsule Take 1 capsule (50,000 Units total) by mouth every 7 (seven) days. Sunday 30 capsule  0 06/06/2015  . VITAMIN E PO Take 1 capsule by mouth daily.   06/11/2015 at Unknown time   Assessment: 59 y.o. female with SOB, elevated D-Dimer, possible PE, for heparin Goal of Therapy:  Heparin level 0.3-0.7 units/ml Monitor platelets by anticoagulation protocol: Yes   Plan:  Heparin 3000 units IV bolus, then start heparin 1000 units/hr Check heparin level in 8 hours.   Eddie Candle 07/26/2015,11:36 PM

## 2015-07-26 NOTE — ED Notes (Signed)
Patient states that she fell on Thursday and since she has had pain in her chest and SOB

## 2015-07-26 NOTE — Progress Notes (Signed)
Rt entered to find pt with sats of 81% on RA.  Rt placed [place] at 6LPM and pt began to recover.  BS were rhonchus and course with slight wheeze.  Rt administered duoneb to pt.  Pt has recovered at this time and O2 was turned down to 2LPM.  Sats are now 94%.  Rt will continue to monitor.

## 2015-07-26 NOTE — ED Provider Notes (Signed)
CSN: [id number]     Arrival date & time 07/26/15  1913 History  This chart was scribed for Glynn Octave, MD by Murriel Hopper, ED Scribe. This patient was seen in room MH06/MH06 and the patient's care was started at 7:47 PM.    Chief Complaint  Patient presents with  . Chest Pain   LEVEL 5 CAVEAT for respiratory distress   The history is provided by the patient. No language interpreter was used.     HPI Comments: Amber [name] is a 59 y.o. female with a hx of asthma who presents to the Emergency Department complaining of constant worsening chest pain with associated SOB, fever, headache, middle back pain, and bilateral rib pain that has been present for 6 days. Pt states that she fell backwards and landed on her back while using her walker 6 days ago. Pt states she hit the back of her head on the ground. Her son states that he made her come in today because her symptoms had been getting worse. Pt states she has been taking Aspirin for her symptoms with little relief. Pt also reports having bilateral numbness in her feet that has been present since the fall. Pt denies hx of MI, stents in her heart.     Past Medical History  Diagnosis Date  . Arthritis   . Asthma   . Hypertension   . Constipation   . GERD (gastroesophageal reflux disease)   . Diabetes mellitus without complication   . Chronic pain   . Seizures     last seizure March 2015   Past Surgical History  Procedure Laterality Date  . Back surgery    . Cholecystectomy    . Appendectomy     Family History  Problem Relation Age of Onset  . Kidney failure Mother   . Hypertension Sister   . Hypertension Brother    Social History  Substance Use Topics  . Smoking status: Former Games developer  . Smokeless tobacco: Never Used  . Alcohol Use: No     Comment: admits to 2 drinks/week   OB History    No data available     Review of Systems  A complete 10 system review of systems was obtained and all systems are negative  except as noted in the HPI and PMH.    Allergies  Ivp dye  Home Medications   Prior to Admission medications   Medication Sig Start Date End Date Taking? Authorizing Provider  albuterol (PROVENTIL HFA;VENTOLIN HFA) 108 (90 BASE) MCG/ACT inhaler Inhale 2 puffs into the lungs every 6 (six) hours as needed for wheezing or shortness of breath.    Historical Provider, MD  allopurinol (ZYLOPRIM) 300 MG tablet Take 300 mg by mouth daily.    Historical Provider, MD  amLODipine (NORVASC) 5 MG tablet Take 5 mg by mouth daily.    Historical Provider, MD  aspirin EC 81 MG tablet Take 1 tablet (81 mg total) by mouth daily. 06/14/15   Richarda Overlie, MD  diazepam (VALIUM) 5 MG tablet Take 1 tablet (5 mg total) by mouth every 12 (twelve) hours as needed for muscle spasms. Patient not taking: Reported on 06/12/2015 05/25/15   Kathrynn Speed, PA-C  esomeprazole (NEXIUM) 40 MG capsule Take 40 mg by mouth daily at 12 noon.    Historical Provider, MD  Fluticasone-Salmeterol (ADVAIR) 250-50 MCG/DOSE AEPB Inhale 1 puff into the lungs 2 (two) times daily.    Historical Provider, MD  folic acid (FOLVITE) 1 MG tablet Take  1 mg by mouth daily.    Historical Provider, MD  furosemide (LASIX) 40 MG tablet Take 1 tablet (40 mg total) by mouth daily. 06/14/15   Richarda Overlie, MD  gabapentin (NEURONTIN) 300 MG capsule Take 1 capsule (300 mg total) by mouth 2 (two) times daily. 06/14/15   Richarda Overlie, MD  levETIRAcetam (KEPPRA) 500 MG tablet Take 1 tablet (500 mg total) by mouth 2 (two) times daily. 01/27/15   Calvert Cantor, MD  metFORMIN (GLUCOPHAGE-XR) 500 MG 24 hr tablet Take 500 mg by mouth daily with breakfast.    Historical Provider, MD  metoprolol succinate (TOPROL-XL) 25 MG 24 hr tablet Take 25 mg by mouth daily.    Historical Provider, MD  Oxycodone HCl 20 MG TABS Take 20 mg by mouth every 4 (four) hours.    Historical Provider, MD  oxymorphone (OPANA ER) 40 MG 12 hr tablet Take 40 mg by mouth every 12 (twelve) hours.     Historical Provider, MD  polyethylene glycol (MIRALAX / GLYCOLAX) packet Take 17 g by mouth 2 (two) times daily. 06/14/15   Richarda Overlie, MD  potassium chloride SA (K-DUR,KLOR-CON) 20 MEQ tablet Take 2 tablets (40 mEq total) by mouth daily. 01/27/15   Calvert Cantor, MD  Vitamin D, Ergocalciferol, (DRISDOL) 50000 UNITS CAPS capsule Take 1 capsule (50,000 Units total) by mouth every 7 (seven) days. Sunday 01/27/15   Calvert Cantor, MD  VITAMIN E PO Take 1 capsule by mouth daily.    Historical Provider, MD   BP 168/104 mmHg  Pulse 107  Temp(Src) 99.4 F (37.4 C)  Resp 26  Ht 5' (1.524 m)  Wt 139 lb 5.3 oz (63.2 kg)  BMI 27.21 kg/m2  SpO2 99% Physical Exam  Constitutional: She is oriented to person, place, and time. She appears well-developed and well-nourished. She appears distressed.  Uncomfortable   HENT:  Head: Normocephalic and atraumatic.  Mouth/Throat: Oropharynx is clear and moist. No oropharyngeal exudate.  Eyes: Conjunctivae and EOM are normal. Pupils are equal, round, and reactive to light.  Neck: Normal range of motion. Neck supple.  No meningismus.  Cardiovascular: Regular rhythm, normal heart sounds and intact distal pulses.   No murmur heard. Tachycardic   Pulmonary/Chest: She is in respiratory distress. She has wheezes. She exhibits tenderness.  Tachypnic  Decreased breath sounds with scattered expiratory wheezing   Diffuse tenderness to chest wall and mid-thoracic back  Abdominal: Soft. There is no tenderness. There is no rebound and no guarding.  Musculoskeletal: Normal range of motion. She exhibits no edema or tenderness.  No peripheral edema  Neurological: She is alert and oriented to person, place, and time. No cranial nerve deficit. She exhibits normal muscle tone. Coordination normal.  No ataxia on finger to nose bilaterally. No pronator drift. 5/5 strength throughout. CN 2-12 intact.Equal grip strength. Sensation intact.   Skin: Skin is warm.  Psychiatric: She has a  normal mood and affect. Her behavior is normal.  Nursing note and vitals reviewed.   ED Course  Procedures (including critical care time)  DIAGNOSTIC STUDIES: Oxygen Saturation is 96% on room air, normal by my interpretation.    COORDINATION OF CARE: 8:00 PM Discussed treatment plan with pt at bedside and pt agreed to plan.    Labs Review Labs Reviewed  CBC - Abnormal; Notable for the following:    WBC 11.7 (*)    Hemoglobin 11.3 (*)    HCT 34.7 (*)    MCV 76.6 (*)    MCH 24.9 (*)  All other components within normal limits  COMPREHENSIVE METABOLIC PANEL - Abnormal; Notable for the following:    Glucose, Bld 201 (*)    Albumin 2.8 (*)    Alkaline Phosphatase 131 (*)    All other components within normal limits  TROPONIN I - Abnormal; Notable for the following:    Troponin I 0.04 (*)    All other components within normal limits  D-DIMER, QUANTITATIVE (NOT AT [facility]) - Abnormal; Notable for the following:    D-Dimer, Quant 2.61 (*)    All other components within normal limits  I-STAT ARTERIAL BLOOD GAS, ED - Abnormal; Notable for the following:    pH, Arterial 7.505 (*)    pCO2 arterial 25.8 (*)    pO2, Arterial 77.0 (*)    All other components within normal limits  CULTURE, BLOOD (ROUTINE X 2)  CULTURE, BLOOD (ROUTINE X 2)  MRSA PCR SCREENING  CULTURE, EXPECTORATED SPUTUM-ASSESSMENT  GRAM STAIN  CULTURE, BLOOD (ROUTINE X 2)  CULTURE, BLOOD (ROUTINE X 2)  BRAIN NATRIURETIC PEPTIDE  URINALYSIS, ROUTINE W REFLEX MICROSCOPIC (NOT AT [facility])  HEPARIN LEVEL (UNFRACTIONATED)  CBC  TROPONIN I  TROPONIN I  TROPONIN I  BRAIN NATRIURETIC PEPTIDE  TSH  LACTIC ACID, PLASMA  PROCALCITONIN  COMPREHENSIVE METABOLIC PANEL  CBC WITH DIFFERENTIAL/PLATELET  HIV ANTIBODY (ROUTINE TESTING)  LEGIONELLA ANTIGEN, URINE  STREP PNEUMONIAE URINARY ANTIGEN  I-STAT CG4 LACTIC ACID, ED    Imaging Review Ct Head Wo Contrast  07/26/2015   CLINICAL DATA:  59 year old female with fall   EXAM: CT HEAD WITHOUT CONTRAST  TECHNIQUE: Contiguous axial images were obtained from the base of the skull through the vertex without intravenous contrast.  COMPARISON:  None.  FINDINGS: The ventricles and sulci are appropriate in size for the patient's age. Mild periventricular and deep white matter hypodensities represent chronic microvascular ischemic changes. There is no intracranial hemorrhage. No mass effect or midline shift identified.  The visualized paranasal sinuses and mastoid air cells are well aerated. The calvarium is intact.  IMPRESSION: No acute intracranial pathology.  Mild chronic microvascular ischemic disease.   Electronically Signed   By: Elgie Collard M.D.   On: 07/26/2015 22:20   Dg Chest Portable 1 View  07/26/2015   CLINICAL DATA:  Short of breath.  Recent fall.  Chest pain  EXAM: PORTABLE CHEST - 1 VIEW  COMPARISON:  06/11/2015  FINDINGS: Cardiac enlargement. Right upper lobe and right lower lobe airspace disease may represent pneumonia or asymmetric edema. Small left pleural effusion. Mild left lower lobe airspace disease, probably atelectasis.  Thoracic fusion with hardware unchanged.  IMPRESSION: Asymmetric airspace disease right greater than left. This may represent pneumonia. Edema also possible. There appears to be underlying chronic lung disease.   Electronically Signed   By: Marlan Palau M.D.   On: 07/26/2015 20:21   I have personally reviewed and evaluated these images and lab results as part of my medical decision-making.   EKG Interpretation   Date/Time:  Monday July 26 2015 19:23:11 EDT Ventricular Rate:  117 PR Interval:  112 QRS Duration: 66 QT Interval:  304 QTC Calculation: 424 R Axis:   57 Text Interpretation:  Sinus tachycardia Nonspecific ST and T wave  abnormality Abnormal ECG No significant change was found Confirmed by  Manus Gunning  MD, Sury Wentworth [id number]) on 07/26/2015 7:27:03 PM      MDM   Final diagnoses:  CAP (community acquired pneumonia)    chest pain, shortness of breath, back pain since a  fall 4 days ago. Denies fever. Endorses productive cough.  Patient with respiratory distress, tachypnea, hypoxia, tachycardia. Decreased breath sounds with scattered story wheezing. Nebulizer given. Chest x-ray with asymmetric airspace disease. Troponin is mildly positive which appears to be baseline. D-dimer is positive. Patient does have IV contrast allergy.  CT head will be obtained due to patient's fall last week. If negative we'll start empiric heparin.  Patient treated for pneumonia. She was also given nebulizers and steroids. She remains tachycardic and tachypneic.  99% on 2 L O2.  No CO2 retention on ABG. Heparin gtt started for possible PE.  CT head negative.  Admission d/w Dr. Toniann Fail who accepts patient to [facility] stepdown unit.    CRITICAL CARE Performed by: Glynn Octave Total critical care time: 60 Critical care time was exclusive of separately billable procedures and treating other patients. Critical care was necessary to treat or prevent imminent or life-threatening deterioration. Critical care was time spent personally by me on the following activities: development of treatment plan with patient and/or surrogate as well as nursing, discussions with consultants, evaluation of patient's response to treatment, examination of patient, obtaining history from patient or surrogate, ordering and performing treatments and interventions, ordering and review of laboratory studies, ordering and review of radiographic studies, pulse oximetry and re-evaluation of patient's condition.   I personally performed the services described in this documentation, which was scribed in my presence. The recorded information has been reviewed and is accurate.   Glynn Octave, MD 07/27/15 [phone]

## 2015-07-26 NOTE — ED Notes (Signed)
Attempt to call report nurse unavailble

## 2015-07-26 NOTE — ED Notes (Signed)
Patient ranged out requesting pain medication and ice chip RN Sue Lush aware

## 2015-07-27 ENCOUNTER — Inpatient Hospital Stay (HOSPITAL_COMMUNITY): Payer: Medicaid Other

## 2015-07-27 ENCOUNTER — Encounter (HOSPITAL_COMMUNITY): Payer: Self-pay | Admitting: Internal Medicine

## 2015-07-27 DIAGNOSIS — R296 Repeated falls: Secondary | ICD-10-CM | POA: Diagnosis present

## 2015-07-27 DIAGNOSIS — E118 Type 2 diabetes mellitus with unspecified complications: Secondary | ICD-10-CM | POA: Diagnosis present

## 2015-07-27 DIAGNOSIS — G8929 Other chronic pain: Secondary | ICD-10-CM | POA: Diagnosis present

## 2015-07-27 DIAGNOSIS — M109 Gout, unspecified: Secondary | ICD-10-CM | POA: Diagnosis present

## 2015-07-27 DIAGNOSIS — I270 Primary pulmonary hypertension: Secondary | ICD-10-CM

## 2015-07-27 DIAGNOSIS — R079 Chest pain, unspecified: Secondary | ICD-10-CM

## 2015-07-27 DIAGNOSIS — J189 Pneumonia, unspecified organism: Secondary | ICD-10-CM | POA: Diagnosis present

## 2015-07-27 DIAGNOSIS — I119 Hypertensive heart disease without heart failure: Secondary | ICD-10-CM | POA: Diagnosis present

## 2015-07-27 DIAGNOSIS — I272 Pulmonary hypertension, unspecified: Secondary | ICD-10-CM | POA: Diagnosis present

## 2015-07-27 DIAGNOSIS — R569 Unspecified convulsions: Secondary | ICD-10-CM

## 2015-07-27 DIAGNOSIS — E119 Type 2 diabetes mellitus without complications: Secondary | ICD-10-CM

## 2015-07-27 DIAGNOSIS — M549 Dorsalgia, unspecified: Secondary | ICD-10-CM

## 2015-07-27 LAB — COMPREHENSIVE METABOLIC PANEL
ALT: 25 U/L (ref 14–54)
AST: 33 U/L (ref 15–41)
Albumin: 2.2 g/dL — ABNORMAL LOW (ref 3.5–5.0)
Alkaline Phosphatase: 109 U/L (ref 38–126)
Anion gap: 11 (ref 5–15)
BUN: 8 mg/dL (ref 6–20)
CHLORIDE: 103 mmol/L (ref 101–111)
CO2: 19 mmol/L — AB (ref 22–32)
CREATININE: 0.95 mg/dL (ref 0.44–1.00)
Calcium: 8.4 mg/dL — ABNORMAL LOW (ref 8.9–10.3)
GFR calc non Af Amer: >60 mL/min (ref >60)
Glucose, Bld: 339 mg/dL — ABNORMAL HIGH (ref 65–99)
POTASSIUM: 3.5 mmol/L (ref 3.5–5.1)
SODIUM: 133 mmol/L — AB (ref 135–145)
Total Bilirubin: 0.4 mg/dL (ref 0.3–1.2)
Total Protein: 6.2 g/dL — ABNORMAL LOW (ref 6.5–8.1)

## 2015-07-27 LAB — CBC WITH DIFFERENTIAL/PLATELET
Basophils Absolute: 0 10*3/uL (ref 0.0–0.1)
Basophils Relative: 0 % (ref 0–1)
Eosinophils Absolute: 0 10*3/uL (ref 0.0–0.7)
Eosinophils Relative: 0 % (ref 0–5)
HEMATOCRIT: 29.1 % — AB (ref 36.0–46.0)
HEMOGLOBIN: 9.4 g/dL — AB (ref 12.0–15.0)
LYMPHS ABS: 1.3 10*3/uL (ref 0.7–4.0)
LYMPHS PCT: 14 % (ref 12–46)
MCH: 25 pg — AB (ref 26.0–34.0)
MCHC: 32.3 g/dL (ref 30.0–36.0)
MCV: 77.4 fL — AB (ref 78.0–100.0)
MONOS PCT: 6 % (ref 3–12)
Monocytes Absolute: 0.6 10*3/uL (ref 0.1–1.0)
NEUTROS ABS: 7.4 10*3/uL (ref 1.7–7.7)
NEUTROS PCT: 80 % — AB (ref 43–77)
Platelets: 285 10*3/uL (ref 150–400)
RBC: 3.76 MIL/uL — ABNORMAL LOW (ref 3.87–5.11)
RDW: 14.9 % (ref 11.5–15.5)
WBC: 9.3 10*3/uL (ref 4.0–10.5)

## 2015-07-27 LAB — URINALYSIS, ROUTINE W REFLEX MICROSCOPIC
BILIRUBIN URINE: NEGATIVE
Glucose, UA: 250 mg/dL — AB
HGB URINE DIPSTICK: NEGATIVE
Ketones, ur: NEGATIVE mg/dL
Nitrite: NEGATIVE
PROTEIN: 30 mg/dL — AB
Specific Gravity, Urine: 1.018 (ref 1.005–1.030)
UROBILINOGEN UA: 1 mg/dL (ref 0.0–1.0)
pH: 7 (ref 5.0–8.0)

## 2015-07-27 LAB — URINE MICROSCOPIC-ADD ON

## 2015-07-27 LAB — BRAIN NATRIURETIC PEPTIDE: B Natriuretic Peptide: 41.3 pg/mL (ref 0.0–100.0)

## 2015-07-27 LAB — GLUCOSE, CAPILLARY
GLUCOSE-CAPILLARY: 183 mg/dL — AB (ref 65–99)
GLUCOSE-CAPILLARY: 190 mg/dL — AB (ref 65–99)
Glucose-Capillary: 303 mg/dL — ABNORMAL HIGH (ref 65–99)

## 2015-07-27 LAB — TSH: TSH: 0.363 u[IU]/mL (ref 0.350–4.500)

## 2015-07-27 LAB — HIV ANTIBODY (ROUTINE TESTING W REFLEX): HIV Screen 4th Generation wRfx: NONREACTIVE

## 2015-07-27 LAB — PROCALCITONIN: Procalcitonin: 3.33 ng/mL

## 2015-07-27 LAB — HEPARIN LEVEL (UNFRACTIONATED): Heparin Unfractionated: 0.38 IU/mL (ref 0.30–0.70)

## 2015-07-27 LAB — TROPONIN I
TROPONIN I: 0.07 ng/mL — AB (ref <0.031)
TROPONIN I: 0.05 ng/mL — AB (ref <0.031)
TROPONIN I: 0.05 ng/mL — AB (ref <0.031)

## 2015-07-27 LAB — LACTIC ACID, PLASMA: LACTIC ACID, VENOUS: 1.6 mmol/L (ref 0.5–2.0)

## 2015-07-27 LAB — STREP PNEUMONIAE URINARY ANTIGEN: Strep Pneumo Urinary Antigen: NEGATIVE

## 2015-07-27 LAB — MRSA PCR SCREENING: MRSA by PCR: NEGATIVE

## 2015-07-27 MED ORDER — ONDANSETRON HCL 4 MG PO TABS: 4.0000 mg | ORAL_TABLET | Freq: Four times a day (QID) | ORAL | Status: DC | PRN

## 2015-07-27 MED ORDER — INSULIN ASPART 100 UNIT/ML ~~LOC~~ SOLN
0.0000 [IU] | Freq: Three times a day (TID) | SUBCUTANEOUS | Status: DC
  Administered 2015-07-27: 2 [IU] via SUBCUTANEOUS
  Administered 2015-07-27: 7 [IU] via SUBCUTANEOUS
  Administered 2015-07-28 (×2): 2 [IU] via SUBCUTANEOUS
  Administered 2015-07-29: 1 [IU] via SUBCUTANEOUS
  Administered 2015-07-29: 2 [IU] via SUBCUTANEOUS
  Administered 2015-07-30 (×2): 1 [IU] via SUBCUTANEOUS
  Administered 2015-07-30 – 2015-07-31 (×2): 2 [IU] via SUBCUTANEOUS
  Administered 2015-07-31: 5 [IU] via SUBCUTANEOUS
  Administered 2015-07-31: 1 [IU] via SUBCUTANEOUS
  Administered 2015-08-01: 2 [IU] via SUBCUTANEOUS
  Administered 2015-08-01: 1 [IU] via SUBCUTANEOUS
  Administered 2015-08-02: 3 [IU] via SUBCUTANEOUS

## 2015-07-27 MED ORDER — ALLOPURINOL 300 MG PO TABS
300.0000 mg | ORAL_TABLET | Freq: Every day | ORAL | Status: DC
  Administered 2015-07-27 – 2015-08-02 (×7): 300 mg via ORAL
  Filled 2015-07-27 (×7): qty 1

## 2015-07-27 MED ORDER — TECHNETIUM TC 99M DIETHYLENETRIAME-PENTAACETIC ACID: 40.0000 | Freq: Once | INTRAVENOUS | Status: DC | PRN

## 2015-07-27 MED ORDER — HEPARIN SODIUM (PORCINE) 5000 UNIT/ML IJ SOLN
5000.0000 [IU] | Freq: Three times a day (TID) | INTRAMUSCULAR | Status: DC
  Administered 2015-07-27 – 2015-08-02 (×17): 5000 [IU] via SUBCUTANEOUS
  Filled 2015-07-27 (×14): qty 1

## 2015-07-27 MED ORDER — PIPERACILLIN-TAZOBACTAM 3.375 G IVPB
3.3750 g | Freq: Three times a day (TID) | INTRAVENOUS | Status: DC
  Administered 2015-07-27 – 2015-07-29 (×7): 3.375 g via INTRAVENOUS
  Filled 2015-07-27 (×10): qty 50

## 2015-07-27 MED ORDER — SODIUM CHLORIDE 0.9 % IV SOLN
INTRAVENOUS | Status: DC
  Administered 2015-07-27: 01:00:00 via INTRAVENOUS

## 2015-07-27 MED ORDER — LEVALBUTEROL HCL 0.63 MG/3ML IN NEBU
0.6300 mg | INHALATION_SOLUTION | Freq: Four times a day (QID) | RESPIRATORY_TRACT | Status: DC
  Administered 2015-07-27 – 2015-07-28 (×6): 0.63 mg via RESPIRATORY_TRACT
  Filled 2015-07-27 (×7): qty 3

## 2015-07-27 MED ORDER — LEVALBUTEROL HCL 0.63 MG/3ML IN NEBU: 0.6300 mg | INHALATION_SOLUTION | Freq: Four times a day (QID) | RESPIRATORY_TRACT | Status: DC | PRN

## 2015-07-27 MED ORDER — ONDANSETRON HCL 4 MG/2ML IJ SOLN
4.0000 mg | Freq: Four times a day (QID) | INTRAMUSCULAR | Status: DC | PRN
  Administered 2015-07-28 – 2015-07-31 (×2): 4 mg via INTRAVENOUS
  Filled 2015-07-27 (×2): qty 2

## 2015-07-27 MED ORDER — KETOROLAC TROMETHAMINE 30 MG/ML IJ SOLN
30.0000 mg | Freq: Once | INTRAMUSCULAR | Status: AC
  Administered 2015-07-28: 30 mg via INTRAVENOUS
  Filled 2015-07-27: qty 1

## 2015-07-27 MED ORDER — VANCOMYCIN HCL IN DEXTROSE 1-5 GM/200ML-% IV SOLN
1000.0000 mg | Freq: Once | INTRAVENOUS | Status: AC
  Administered 2015-07-27: 1000 mg via INTRAVENOUS
  Filled 2015-07-27: qty 200

## 2015-07-27 MED ORDER — MORPHINE SULFATE ER 15 MG PO TBCR: 30.0000 mg | EXTENDED_RELEASE_TABLET | Freq: Two times a day (BID) | ORAL | Status: DC

## 2015-07-27 MED ORDER — CETYLPYRIDINIUM CHLORIDE 0.05 % MT LIQD
7.0000 mL | Freq: Two times a day (BID) | OROMUCOSAL | Status: DC
  Administered 2015-07-27 – 2015-07-29 (×4): 7 mL via OROMUCOSAL

## 2015-07-27 MED ORDER — METOPROLOL SUCCINATE ER 25 MG PO TB24
25.0000 mg | ORAL_TABLET | Freq: Every day | ORAL | Status: DC
  Administered 2015-07-27 – 2015-08-02 (×7): 25 mg via ORAL
  Filled 2015-07-27 (×7): qty 1

## 2015-07-27 MED ORDER — DIAZEPAM 5 MG PO TABS
5.0000 mg | ORAL_TABLET | Freq: Once | ORAL | Status: AC
  Administered 2015-07-28: 5 mg via ORAL
  Filled 2015-07-27: qty 1

## 2015-07-27 MED ORDER — ASPIRIN 325 MG PO TABS
325.0000 mg | ORAL_TABLET | Freq: Every day | ORAL | Status: DC
  Administered 2015-07-27 – 2015-08-02 (×7): 325 mg via ORAL
  Filled 2015-07-27 (×7): qty 1

## 2015-07-27 MED ORDER — TECHNETIUM TO 99M ALBUMIN AGGREGATED
6.0000 | Freq: Once | INTRAVENOUS | Status: AC | PRN
  Administered 2015-07-27: 6 via INTRAVENOUS

## 2015-07-27 MED ORDER — FOLIC ACID 1 MG PO TABS
1.0000 mg | ORAL_TABLET | Freq: Every day | ORAL | Status: DC
  Administered 2015-07-27 – 2015-08-02 (×7): 1 mg via ORAL
  Filled 2015-07-27 (×7): qty 1

## 2015-07-27 MED ORDER — OXYCODONE HCL 5 MG PO TABS
20.0000 mg | ORAL_TABLET | ORAL | Status: DC
  Administered 2015-07-27 (×5): 20 mg via ORAL
  Filled 2015-07-27 (×5): qty 4

## 2015-07-27 MED ORDER — PANTOPRAZOLE SODIUM 40 MG PO TBEC
40.0000 mg | DELAYED_RELEASE_TABLET | Freq: Every day | ORAL | Status: DC
  Administered 2015-07-27 – 2015-08-02 (×7): 40 mg via ORAL
  Filled 2015-07-27 (×6): qty 1

## 2015-07-27 MED ORDER — LEVETIRACETAM 500 MG PO TABS
500.0000 mg | ORAL_TABLET | Freq: Two times a day (BID) | ORAL | Status: DC
  Administered 2015-07-27 – 2015-08-02 (×14): 500 mg via ORAL
  Filled 2015-07-27 (×14): qty 1

## 2015-07-27 MED ORDER — OXYMORPHONE HCL ER 10 MG PO T12A
40.0000 mg | EXTENDED_RELEASE_TABLET | Freq: Two times a day (BID) | ORAL | Status: DC
  Administered 2015-07-27 (×2): 40 mg via ORAL
  Filled 2015-07-27 (×2): qty 4

## 2015-07-27 MED ORDER — GABAPENTIN 300 MG PO CAPS
300.0000 mg | ORAL_CAPSULE | Freq: Two times a day (BID) | ORAL | Status: DC
  Administered 2015-07-27 (×2): 300 mg via ORAL
  Filled 2015-07-27 (×2): qty 1

## 2015-07-27 MED ORDER — MOMETASONE FURO-FORMOTEROL FUM 100-5 MCG/ACT IN AERO
2.0000 | INHALATION_SPRAY | Freq: Two times a day (BID) | RESPIRATORY_TRACT | Status: DC
  Administered 2015-07-27 – 2015-08-02 (×12): 2 via RESPIRATORY_TRACT
  Filled 2015-07-27 (×2): qty 8.8

## 2015-07-27 MED ORDER — ACETAMINOPHEN 650 MG RE SUPP: 650.0000 mg | Freq: Four times a day (QID) | RECTAL | Status: DC | PRN

## 2015-07-27 MED ORDER — OXYMORPHONE HCL ER 10 MG PO T12A
20.0000 mg | EXTENDED_RELEASE_TABLET | Freq: Two times a day (BID) | ORAL | Status: DC
  Administered 2015-07-27: 20 mg via ORAL
  Filled 2015-07-27: qty 2

## 2015-07-27 MED ORDER — MAGIC MOUTHWASH W/LIDOCAINE
15.0000 mL | Freq: Four times a day (QID) | ORAL | Status: DC | PRN
  Administered 2015-07-27 – 2015-07-30 (×9): 15 mL via ORAL
  Filled 2015-07-27 (×12): qty 15

## 2015-07-27 MED ORDER — ACETAMINOPHEN 325 MG PO TABS: 650.0000 mg | ORAL_TABLET | Freq: Four times a day (QID) | ORAL | Status: DC | PRN

## 2015-07-27 MED ORDER — VANCOMYCIN HCL 500 MG IV SOLR
500.0000 mg | Freq: Two times a day (BID) | INTRAVENOUS | Status: DC
  Administered 2015-07-27 – 2015-07-29 (×4): 500 mg via INTRAVENOUS
  Filled 2015-07-27 (×5): qty 500

## 2015-07-27 MED ORDER — OXYCODONE HCL 5 MG PO TABS
20.0000 mg | ORAL_TABLET | Freq: Once | ORAL | Status: AC
  Administered 2015-07-28: 20 mg via ORAL
  Filled 2015-07-27: qty 4

## 2015-07-27 MED ORDER — DIAZEPAM 5 MG PO TABS
5.0000 mg | ORAL_TABLET | Freq: Two times a day (BID) | ORAL | Status: DC | PRN
  Administered 2015-07-27 – 2015-08-02 (×11): 5 mg via ORAL
  Filled 2015-07-27 (×12): qty 1

## 2015-07-27 MED ORDER — AMLODIPINE BESYLATE 5 MG PO TABS
5.0000 mg | ORAL_TABLET | Freq: Every day | ORAL | Status: DC
  Administered 2015-07-27 – 2015-08-02 (×7): 5 mg via ORAL
  Filled 2015-07-27 (×7): qty 1

## 2015-07-27 NOTE — Care Management Note (Signed)
Case Management Note  Patient Details  Name: Amber [name] MRN: [id number] Date of Birth: 1956/10/24  Subjective/Objective:           Adm w sepsis         Action/Plan: lives w fam   Expected Discharge Date:                  Expected Discharge Plan:  Home/Self Care  In-House Referral:     Discharge planning Services     Post Acute Care Choice:    Choice offered to:     DME Arranged:    DME Agency:     HH Arranged:    HH Agency:     Status of Service:     Medicare Important Message Given:    Date Medicare IM Given:    Medicare IM give by:    Date Additional Medicare IM Given:    Additional Medicare Important Message give by:     If discussed at Long Length of Stay Meetings, dates discussed:    Additional Comments: ur review done  Hanley Hays, RN 07/27/2015, 9:01 AM

## 2015-07-27 NOTE — Progress Notes (Signed)
ANTICOAGULATION CONSULT NOTE - Follow Up Consult  Pharmacy Consult for Heparin Indication: r/o PE  Allergies  Allergen Reactions  . Hydromorphone   . Ivp Dye [Iodinated Diagnostic Agents] Itching    Patient Measurements: Height: 5' (152.4 cm) Weight: 139 lb 15.9 oz (63.5 kg) IBW/kg (Calculated) : 45.5 Heparin Dosing Weight: ~59kg  Vital Signs: Temp: 98.5 F (36.9 C) (09/06 0700) Temp Source: Oral (09/06 0700) BP: 135/88 mmHg (09/06 0700) Pulse Rate: 109 (09/06 1000)  Labs:  Recent Labs  07/26/15 1931 07/27/15 0145 07/27/15 0631 07/27/15 0950  HGB 11.3*  --  9.4*  --   HCT 34.7*  --  29.1*  --   PLT 336  --  285  --   HEPARINUNFRC  --   --   --  0.38  CREATININE 0.90  --  0.95  --   TROPONINI 0.04* 0.07* 0.05*  --     Estimated Creatinine Clearance: 54.4 mL/min (by C-G formula based on Cr of 0.95).   Medications:  Heparin @ 1000 units/hr  Assessment: 59yof was started on heparin last night for possible PE. VQ scan pending. Initial heparin level is therapeutic at 0.38. Noted drop in Hgb from 11.3 to 9.4 which is likely dilutional, platelets stable. No bleeding reported.  Goal of Therapy:  Heparin level 0.3-0.7 units/ml Monitor platelets by anticoagulation protocol: Yes   Plan:  1) Continue heparin at 1000 units/hr 2) Check 6 hour heparin level to confirm 3) Follow up VQ scan  Fredrik Rigger 07/27/2015,11:00 AM

## 2015-07-27 NOTE — Progress Notes (Addendum)
[facility] TEAM 1 - Stepdown/ICU TEAM Progress Note  Amber [name] ZOX:[id number] DOB: [birth year] DOA: 07/26/2015 PCP: [name], MD  Admit HPI / Brief Narrative: Amber [name] is a 59 y.o. 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 Patient states he has she had a fall 3 days ago following which patient started developing chest pain and worsening shortness of breath. In the ER patient was found to be short of breath and CXR  shows possible infiltrates. Patient's d-dimer also was mildly elevated but patient is allergic to contrast. Patient was empirically started on heparin and on antibiotics for possible pneumonia and admitted for further management. On exam patient is not in distress but still complaining of chest tightness and shortness of breath. Denies any nausea vomiting abdominal pain. Has had some diarrhea 2 days ago.    HPI/Subjective: 9/6 A/O 4, patient states pain started after she fell 3 on Sunday after walker lost a wheel. Patient states struck her back and right shoulder on floor. States currently positive left upper chest wall pain, positive back pain, positive right shoulder pain, negative SOB.   Assessment/Plan:  Acute respiratory failure with hypoxia/HCAP -Most likely multifactorial narcotics, benzodiazepine, and pneumonia pneumonia  -Continue Vancomycin and Zosyn for HCAP -VQ scan negative  Chest pain with elevated troponin -Most likely secondary to demand ischemia, trending down  Chronic back pain  -S/P thoracic spinal fusion extending from T1-T8, decompression at T3-T5, and associated prosthesis (with residual bilateral lower  extremity weakness and apathy) -Unsure of who is managing patient's chronic pain treatment but patient on an extensive amount of pain medication and benzodiazepine's which is most likely also continue treating to her falls. - Hold oxycodone 20 mg -Hold gabapentin -D/C oxycodone IR -Opana ER 40 mg decrease to 20 mg BID  -Continue Valium 5 mg BID -ONLY PRIMARY TEAM TO CHANGE PAIN MEDICATION -PT/OT evaluation pending. Candidate not doing well at home; overmedication lack of structured physical therapy. Believe patient would benefit from CIR  Frequent falls  -Patient is on extremely large amount of narcotics and benzodiazepine's which more than likely has significantly contributed to patient's multiple falls -See chronic back pain -Orthostatic vitals in the A.m.  HTN -Continue amlodipine 5 mg daily -Continue metoprolol XL 25 mg daily  Pulmonary hypertension -BP controlled right now would not add additional medication. Would consider afterload reducing agent  Diabetes mellitus type 2 -Hemoglobin A1c pending -Lipid panel pending -Continue sensitive SSI  Seizures -Continue Keppra 500 mg BID -Keppra level pending  Gout  -Obtain uric acid level  -For now continue allopurinol 300 mg daily   Code Status: FULL Family Communication: no family present at time of exam Disposition Plan: CIR vs SNF    Consultants: NA  Procedure/Significant Events: 7/24 echocardiogram- Left ventricle: mild LVH. -LVEF=65%.  - Pulmonary arteries: PA peak pressure: 41 mm Hg (S).; 9/5 CT head without contrast;-deep white matter hypodensities represent chronic microvascular ischemic changes. -Negative acute changes 9/6 VQ scan; negative PE   Culture NA  Antibiotics: Azithromycin 1 dose Ceftriaxone 1 dose Zosyn 9/6>> Vancomycin 9/6>>   DVT prophylaxis: Subcutaneous heparin   Devices    LINES / TUBES:      Continuous  Infusions: . sodium chloride 75 mL/hr at 07/27/15 0700     Objective: VITAL SIGNS: Temp: 98.3 F (36.8 C) (09/06 1900) Temp Source: Oral (09/06 1900) BP: 135/98 mmHg (09/06 1900) Pulse Rate: 79 (09/06 1700) SPO2; FIO2:   Intake/Output Summary (Last 24 hours) at 07/27/15 2124 Last data filed at 07/27/15 1945  Gross per 24 hour  Intake 3340.33 ml  Output   1250 ml  Net 2090.33 ml     Exam: General: A/O 4, NAD, No acute respiratory distress Eyes: Negative headache,negative scleral hemorrhage ENT: Negative Runny nose, negative ear pain, negative gingival bleeding, Neck:  Negative scars, masses, torticollis, lymphadenopathy, JVD Lungs: Clear to auscultation bilaterally without wheezes or crackles Cardiovascular: Regular rate and rhythm without murmur gallop or rub normal S1 and S2 Positive reproducible left upper chest wall pain to palpation. Abdomen:negative abdominal pain, negative dysphagia, nondistended, positive soft, bowel sounds, no rebound, no ascites, no appreciable mass Extremities: No significant cyanosis, clubbing, or edema bilateral lower extremities. Positive reproducible pain to palpation of right shoulder Psychiatric:  Negative depression, negative anxiety, negative fatigue, negative mania  Neurologic:  Cranial nerves II through XII intact, tongue/uvula midline, bilateral upper extremity muscle strength 5/5, bilateral lower extremity strength 3/5, sensation intact bilateral upper extremity, bilateral lower extremity with decreased sensation to soft touch, negative dysarthria, negative expressive aphasia, negative receptive aphasia.   Data Reviewed: Basic Metabolic Panel:  Recent Labs Lab 07/26/15 1931 07/27/15 0631  NA 136 133*  K 4.2 3.5  CL 103 103  CO2 23 19*  GLUCOSE 201* 339*  BUN 13 8  CREATININE 0.90 0.95  CALCIUM 9.2 8.4*   Liver Function Tests:  Recent Labs Lab 07/26/15 1931 07/27/15 0631  AST 31 33  ALT 27 25  ALKPHOS 131* 109  BILITOT 0.7 0.4  PROT 7.7 6.2*  ALBUMIN 2.8* 2.2*   No  results for input(s): LIPASE, AMYLASE in the last 168 hours. No results for input(s): AMMONIA in the last 168 hours. CBC:  Recent Labs Lab 07/26/15 1931 07/27/15 0631  WBC 11.7* 9.3  NEUTROABS  --  7.4  HGB 11.3* 9.4*  HCT 34.7* 29.1*  MCV 76.6* 77.4*  PLT 336 285   Cardiac Enzymes:  Recent Labs Lab 07/26/15 1931 07/27/15 0145 07/27/15 0631 07/27/15 1230  TROPONINI 0.04* 0.07* 0.05* 0.05*   BNP (last 3 results)  Recent Labs  06/11/15 2255 07/26/15 1931 07/27/15 0150  BNP 51.1 37.8 41.3    ProBNP (last 3 results) No results for input(s): PROBNP in the last 8760 hours.  CBG:  Recent Labs Lab 07/27/15 0730 07/27/15 1605  GLUCAP 303* 183*    Recent Results (from the past 240 hour(s))  Blood culture (routine x 2)     Status: None (Preliminary result)   Collection Time: 07/26/15  8:01 PM  Result Value Ref Range Status   Specimen Description BLOOD LEFT ANTECUBITAL  Final   Special Requests BOTTLES DRAWN AEROBIC AND ANAEROBIC  5CC EACH  Final   Culture   Final    NO GROWTH < 24 HOURS Performed at [facility]    Report Status PENDING  Incomplete  Blood culture (routine x 2)     Status: None (Preliminary result)   Collection Time: 07/26/15  8:15 PM  Result Value Ref Range Status   Specimen Description BLOOD RIGHT WRIST  Final   Special Requests BOTTLES DRAWN AEROBIC AND ANAEROBIC  5CC EACH  Final   Culture   Final    NO GROWTH <  24 HOURS Performed at [facility]    Report Status PENDING  Incomplete  MRSA PCR Screening     Status: None   Collection Time: 07/26/15 11:17 PM  Result Value Ref Range Status   MRSA by PCR NEGATIVE NEGATIVE Final    Comment:        The GeneXpert MRSA Assay (FDA approved for NASAL specimens only), is one component of a comprehensive MRSA colonization surveillance program. It is not intended to diagnose MRSA infection nor to guide or monitor treatment for MRSA infections.      Studies:  Recent x-ray  studies have been reviewed in detail by the Attending Physician  Scheduled Meds:  Scheduled Meds: . allopurinol  300 mg Oral Daily  . amLODipine  5 mg Oral Daily  . antiseptic oral rinse  7 mL Mouth Rinse BID  . aspirin  325 mg Oral Daily  . folic acid  1 mg Oral Daily  . insulin aspart  0-9 Units Subcutaneous TID WC  . levalbuterol  0.63 mg Nebulization Q6H  . levETIRAcetam  500 mg Oral BID  . metoprolol succinate  25 mg Oral Daily  . mometasone-formoterol  2 puff Inhalation BID  . oxymorphone  20 mg Oral Q12H  . pantoprazole  40 mg Oral Daily  . piperacillin-tazobactam  3.375 g Intravenous Q8H  . vancomycin  500 mg Intravenous Q12H    Time spent on care of this patient: 40 mins   Lashanti Chambless, Roselind Messier , MD  [facility] Office  [phone] Pager - [phone]  On-Call/Text Page:      [email or web address]      password [email or web address]  If 7PM-7AM, please contact night-coverage [email or web address] Password [email or web address] 07/27/2015, 9:24 PM   LOS: 1 day   Care during the described time interval was provided by me .  I have reviewed this patient's available data, including medical history, events of note, physical examination, and all test results as part of my evaluation. I have personally reviewed and interpreted all radiology studies.   Carolyne Littles, MD [phone] Pager

## 2015-07-27 NOTE — Progress Notes (Signed)
ANTIBIOTIC CONSULT NOTE - INITIAL  Pharmacy Consult for Vancomycin  Indication: rule out pneumonia  Allergies  Allergen Reactions  . Ivp Dye [Iodinated Diagnostic Agents] Itching    Patient Measurements: Height: 5' (152.4 cm) Weight: 139 lb 5.3 oz (63.2 kg) IBW/kg (Calculated) : 45.5  Vital Signs: Temp: 99.4 F (37.4 C) (09/05 1918) BP: 151/102 mmHg (09/05 2330) Pulse Rate: 107 (09/05 2330)  Labs:  Recent Labs  07/26/15 1931  WBC 11.7*  HGB 11.3*  PLT 336  CREATININE 0.90   Estimated Creatinine Clearance: 57.3 mL/min (by C-G formula based on Cr of 0.9).  Medical History: Past Medical History  Diagnosis Date  . Arthritis   . Asthma   . Hypertension   . Constipation   . GERD (gastroesophageal reflux disease)   . Diabetes mellitus without complication   . Chronic pain   . Seizures     last seizure March 2015    Assessment: CXR with airspace disease (PNA vs. Edema), WBC mildly elevated, renal function age appropriate, other labs/meds reviewed.   Goal of Therapy:  Vancomycin trough level 15-20 mcg/ml  Plan:  -Vancomycin 1000 mg IV x 1, then 500 mg IV q12h -Zosyn per MD -Drug levels at steady state  Abran Duke 07/27/2015,12:50 AM

## 2015-07-27 NOTE — Progress Notes (Signed)
MRI transport came to take patient to MRI. Patient declined to go stating she "will do it later. I don't feel like it right now". Patient questioned need for test. Advised patient reasoning and that it may be a while before MRI is available again.

## 2015-07-27 NOTE — Progress Notes (Signed)
Inpatient Diabetes Program Recommendations  AACE/ADA: New Consensus Statement on Inpatient Glycemic Control (2013)  Target Ranges:  Prepandial:   less than 140 mg/dL      Peak postprandial:   less than 180 mg/dL (1-2 hours)      Critically ill patients:  140 - 180 mg/dL   Results for Amber [name], Amber [name] (MRN [id number]) as of 07/27/2015 10:05  Ref. Range 07/27/2015 07:30  Glucose-Capillary Latest Ref Range: 65-99 mg/dL 182 (H)   Results for Amber [name], Amber [name] (MRN [id number]) as of 07/27/2015 10:05  Ref. Range 07/26/2015 19:31 07/27/2015 06:31  Glucose Latest Ref Range: 65-99 mg/dL 893 (H) 810 (H)   Diabetes history: DM2 Outpatient Diabetes medications: Metformin 500 mg QAM Current orders for Inpatient glycemic control: Novolog 0-9 units TID with meals  Inpatient Diabetes Program Recommendations Insulin - Basal: Patient recevied a one time dose of Solumedrol 80 mg IV in the ED on 9/5 which has contributed to hyperglycemia. May want to consider ordering a one time dose of Levemir 6 units x 1 now (based on 63 kg x 0.1 untis). Correction (SSI): Please consider ordering Novolog bedtime correction scale. HgbA1C: Please order an A1C to evaluate glycemic control over the past  2-3 months.  Thanks, Orlando Penner, RN, MSN, CCRN, CDE Diabetes Coordinator Inpatient Diabetes Program [phone] (Team Pager from 8am to 5pm) [phone] ([facility] office) [phone] [facility] office) [phone] [facility] office)

## 2015-07-27 NOTE — Progress Notes (Signed)
ANTICOAGULATION CONSULT NOTE - Follow Up Consult  Pharmacy Consult for Heparin Indication: DVT px  Allergies  Allergen Reactions  . Hydromorphone   . Ivp Dye [Iodinated Diagnostic Agents] Itching    Patient Measurements: Height: 5' (152.4 cm) Weight: 139 lb 15.9 oz (63.5 kg) IBW/kg (Calculated) : 45.5 Heparin Dosing Weight: ~59kg  Vital Signs: Temp: 98.3 F (36.8 C) (09/06 1900) Temp Source: Oral (09/06 1900) BP: 135/98 mmHg (09/06 1900) Pulse Rate: 79 (09/06 1700)  Labs:  Recent Labs  07/26/15 1931 07/27/15 0145 07/27/15 0631 07/27/15 0950 07/27/15 1230  HGB 11.3*  --  9.4*  --   --   HCT 34.7*  --  29.1*  --   --   PLT 336  --  285  --   --   HEPARINUNFRC  --   --   --  0.38  --   CREATININE 0.90  --  0.95  --   --   TROPONINI 0.04* 0.07* 0.05*  --  0.05*    Estimated Creatinine Clearance: 54.4 mL/min (by C-G formula based on Cr of 0.95).   Assessment: 59yof was started on heparin last night for possible PE. VQ scan negative for PE.  Pharmacy consulted to change UFH to sq heparin for VTE prophylaxis.   Plan:  -dc heparin drip -heparin 5000 units sq q8h for VTE px  Herby Abraham, Pharm.D. [phone] 07/27/2015 9:31 PM

## 2015-07-27 NOTE — H&P (Signed)
[facility] History and Physical  Amber [name] KGU:[id number] DOB: 1955-11-30 DOA: 07/26/2015  Referring physician: Dr. Manus Gunning. Patient was transferred from [facility] [place]. PCP: [name], MD  Specialists: None.  Chief Complaint: Shortness of breath and chest pain.  HPI: Amber [name] is a 59 y.o. 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 Patient states he has she had a fall 3 days ago following which patient started developing chest pain and worsening shortness of breath. In the ER patient was found to be short of breath and chest x-ray shows possible infiltrates. Patient's d-dimer also was mildly elevated but patient is allergic to contrast. Patient was empirically started on heparin and on antibiotics for possible pneumonia and admitted for further management. On exam patient is not in distress but still complaining of chest tightness and shortness of breath. Denies any nausea vomiting abdominal pain. Has had some diarrhea 2 days ago.   Review of Systems: As presented in the history of presenting illness, rest negative.  Past Medical History  Diagnosis Date  . Arthritis   . Asthma   . Hypertension   . Constipation   . GERD (gastroesophageal reflux disease)   . Diabetes mellitus without complication   . Chronic pain   . Seizures     last seizure March 2015   Past Surgical History  Procedure Laterality Date  . Back surgery    . Cholecystectomy    . Appendectomy     Social History:  reports that she has quit smoking. She has never used smokeless tobacco. She reports that she does not drink alcohol or use illicit drugs. Where does patient live home. Can patient participate in ADLs? Yes.  Allergies  Allergen Reactions  . Ivp Dye [Iodinated  Diagnostic Agents] Itching    Family History:  Family History  Problem Relation Age of Onset  . Kidney failure Mother   . Hypertension Sister   . Hypertension Brother       Prior to Admission medications   Medication Sig Start Date End Date Taking? Authorizing Provider  albuterol (PROVENTIL HFA;VENTOLIN HFA) 108 (90 BASE) MCG/ACT inhaler Inhale 2 puffs into the lungs every 6 (six) hours as needed for wheezing or shortness of breath.    Historical Provider, MD  allopurinol (ZYLOPRIM) 300 MG tablet Take 300 mg by mouth daily.    Historical Provider, MD  amLODipine (NORVASC) 5 MG tablet Take 5 mg by mouth daily.    Historical Provider, MD  aspirin EC 81 MG tablet Take 1 tablet (81 mg total) by mouth daily. 06/14/15   Richarda Overlie, MD  diazepam (VALIUM) 5 MG tablet Take 1 tablet (5 mg total) by mouth every 12 (twelve) hours as needed for muscle spasms. Patient not taking: Reported on 06/12/2015 05/25/15   Kathrynn Speed, PA-C  esomeprazole (NEXIUM) 40 MG capsule Take 40 mg by mouth daily at 12 noon.    Historical Provider, MD  Fluticasone-Salmeterol (ADVAIR) 250-50 MCG/DOSE AEPB Inhale 1 puff into the lungs 2 (two) times daily.    Historical Provider, MD  folic acid (FOLVITE) 1 MG tablet Take 1 mg by mouth daily.    Historical Provider, MD  furosemide (LASIX) 40 MG tablet Take 1 tablet (40 mg total) by mouth daily. 06/14/15   Richarda Overlie, MD  gabapentin (NEURONTIN) 300 MG capsule Take 1 capsule (300 mg total) by mouth 2 (two) times daily. 06/14/15   Richarda Overlie, MD  levETIRAcetam (KEPPRA) 500 MG tablet Take 1 tablet (500 mg total) by mouth 2 (two) times daily. 01/27/15   Calvert Cantor, MD  metFORMIN (GLUCOPHAGE-XR) 500 MG 24 hr tablet Take 500 mg by mouth daily with breakfast.    Historical Provider, MD  metoprolol succinate (TOPROL-XL) 25 MG 24 hr tablet Take 25 mg by mouth daily.    Historical Provider, MD  Oxycodone HCl 20 MG TABS Take 20 mg by mouth every 4 (four) hours.    Historical Provider, MD   oxymorphone (OPANA ER) 40 MG 12 hr tablet Take 40 mg by mouth every 12 (twelve) hours.    Historical Provider, MD  polyethylene glycol (MIRALAX / GLYCOLAX) packet Take 17 g by mouth 2 (two) times daily. 06/14/15   Richarda Overlie, MD  potassium chloride SA (K-DUR,KLOR-CON) 20 MEQ tablet Take 2 tablets (40 mEq total) by mouth daily. 01/27/15   Calvert Cantor, MD  Vitamin D, Ergocalciferol, (DRISDOL) 50000 UNITS CAPS capsule Take 1 capsule (50,000 Units total) by mouth every 7 (seven) days. Sunday 01/27/15   Calvert Cantor, MD  VITAMIN E PO Take 1 capsule by mouth daily.    Historical Provider, MD    Physical Exam: Filed Vitals:   07/26/15 2158 07/26/15 2300 07/26/15 2315 07/26/15 2330  BP: 147/96  156/110 151/102  Pulse: 100  113 107  Temp:      Resp:   27 31  Height:  5' (1.524 m)    Weight:  63.2 kg (139 lb 5.3 oz)    SpO2: 99%  97% 99%     General:  Moderately built and nourished.  Eyes: Anicteric no pallor.  ENT: No discharge from the ears eyes nose and mouth.  Neck: No mass felt.  Cardiovascular: S1-S2 heard.  Respiratory: No rhonchi or crepitations.  Abdomen: Soft nontender bowel sounds present. No guarding or rigidity.  Skin: No rash.  Musculoskeletal: No edema.  Psychiatric: Appears normal.  Neurologic: Alert awake oriented to time place and person. Moves all extremities 5 x 5.  Labs on Admission:  Basic Metabolic Panel:  Recent Labs Lab 07/26/15 1931  NA 136  K 4.2  CL 103  CO2 23  GLUCOSE 201*  BUN 13  CREATININE 0.90  CALCIUM 9.2   Liver Function Tests:  Recent Labs Lab 07/26/15 1931  AST 31  ALT 27  ALKPHOS 131*  BILITOT 0.7  PROT 7.7  ALBUMIN 2.8*   No results for input(s): LIPASE, AMYLASE in the last 168 hours. No results for input(s): AMMONIA in the last 168 hours. CBC:  Recent Labs Lab 07/26/15 1931  WBC 11.7*  HGB 11.3*  HCT 34.7*  MCV 76.6*  PLT 336   Cardiac Enzymes:  Recent Labs Lab 07/26/15 1931  TROPONINI 0.04*     BNP (last 3 results)  Recent Labs  06/11/15 2255 07/26/15 1931  BNP 51.1 37.8    ProBNP (last 3 results) No results for input(s): PROBNP in the last 8760 hours.  CBG: No results for input(s): GLUCAP in the last 168 hours.  Radiological Exams on Admission: Ct Head Wo Contrast  07/26/2015   CLINICAL DATA:  59 year old female with fall  EXAM: CT HEAD WITHOUT CONTRAST  TECHNIQUE: Contiguous axial images were obtained from the base of the skull through the vertex without intravenous contrast.  COMPARISON:  None.  FINDINGS: The ventricles and sulci  are appropriate in size for the patient's age. Mild periventricular and deep white matter hypodensities represent chronic microvascular ischemic changes. There is no intracranial hemorrhage. No mass effect or midline shift identified.  The visualized paranasal sinuses and mastoid air cells are well aerated. The calvarium is intact.  IMPRESSION: No acute intracranial pathology.  Mild chronic microvascular ischemic disease.   Electronically Signed   By: Elgie Collard M.D.   On: 07/26/2015 22:20   Dg Chest Portable 1 View  07/26/2015   CLINICAL DATA:  Short of breath.  Recent fall.  Chest pain  EXAM: PORTABLE CHEST - 1 VIEW  COMPARISON:  06/11/2015  FINDINGS: Cardiac enlargement. Right upper lobe and right lower lobe airspace disease may represent pneumonia or asymmetric edema. Small left pleural effusion. Mild left lower lobe airspace disease, probably atelectasis.  Thoracic fusion with hardware unchanged.  IMPRESSION: Asymmetric airspace disease right greater than left. This may represent pneumonia. Edema also possible. There appears to be underlying chronic lung disease.   Electronically Signed   By: Marlan Palau M.D.   On: 07/26/2015 20:21    EKG: Independently reviewed. Sinus tachycardia.  Assessment/Plan Active Problems:   History of seizures   Acute respiratory failure with hypoxia   Acute respiratory failure   Diabetes mellitus type  2, controlled   Chest pain   HCAP (healthcare-associated pneumonia)   1. Acute respiratory failure with hypoxia most likely secondary to pneumonia - since patient was recently admitted I have placed patient on vancomycin and Zosyn for healthcare associated pneumonia. Since patient's d-dimer is elevated VQ scan has been ordered to rule out PE. Patient will be on heparin until he is ruled out. Cycle cardiac markers. 2. Chest pain with elevated troponin - we will cycle cardiac markers. Aspirin. Patient on heparin. Follow VQ scan. Patient also has pain going to the back and has had previous history of osteomyelitis for which I have ordered MRI T-spine. 3. Diabetes mellitus type 2 - since patient has received steroids in the ER CBGs are likely to go high. Closely follow CBGs with sliding scale coverage. Hold metformin for now. 4. History of seizures continue present medications. 5. History of asthma - continue inhalers. Presently not wheezing on my exam. 6. Hypertension - continue present medications. 7. Chronic pain - continue present medications. 8. Chronic anemia - follow CBC. 9. History of gout.  I have reviewed patient's old charts and labs. Personally reviewed patient's chest x-ray and EKG.   DVT Prophylaxis Lovenox.  Code Status: Full code.  Family Communication: Discussed with patient.  Disposition Plan: Admit to inpatient.    Amber [name] N. [facility] Pager [phone].  If 7PM-7AM, please contact night-coverage [email or web address] Password [email or web address] 07/27/2015, 12:46 AM

## 2015-07-28 LAB — LIPID PANEL
CHOLESTEROL: 118 mg/dL (ref 0–200)
HDL: 43 mg/dL (ref >40)
LDL CALC: 46 mg/dL (ref 0–99)
TRIGLYCERIDES: 146 mg/dL (ref <150)
Total CHOL/HDL Ratio: 2.7 RATIO
VLDL: 29 mg/dL (ref 0–40)

## 2015-07-28 LAB — COMPREHENSIVE METABOLIC PANEL
ALK PHOS: 88 U/L (ref 38–126)
ALT: 19 U/L (ref 14–54)
AST: 20 U/L (ref 15–41)
Albumin: 2.1 g/dL — ABNORMAL LOW (ref 3.5–5.0)
Anion gap: 9 (ref 5–15)
BUN: 9 mg/dL (ref 6–20)
CALCIUM: 8.2 mg/dL — AB (ref 8.9–10.3)
CHLORIDE: 108 mmol/L (ref 101–111)
CO2: 20 mmol/L — AB (ref 22–32)
CREATININE: 1 mg/dL (ref 0.44–1.00)
Glucose, Bld: 137 mg/dL — ABNORMAL HIGH (ref 65–99)
Potassium: 3.2 mmol/L — ABNORMAL LOW (ref 3.5–5.1)
Sodium: 137 mmol/L (ref 135–145)
Total Bilirubin: 0.5 mg/dL (ref 0.3–1.2)
Total Protein: 5.6 g/dL — ABNORMAL LOW (ref 6.5–8.1)

## 2015-07-28 LAB — CBC WITH DIFFERENTIAL/PLATELET
BASOS PCT: 0 % (ref 0–1)
Basophils Absolute: 0 10*3/uL (ref 0.0–0.1)
EOS ABS: 0.2 10*3/uL (ref 0.0–0.7)
EOS PCT: 2 % (ref 0–5)
HCT: 26.6 % — ABNORMAL LOW (ref 36.0–46.0)
Hemoglobin: 8.6 g/dL — ABNORMAL LOW (ref 12.0–15.0)
LYMPHS ABS: 1.8 10*3/uL (ref 0.7–4.0)
Lymphocytes Relative: 17 % (ref 12–46)
MCH: 25.1 pg — AB (ref 26.0–34.0)
MCHC: 32.3 g/dL (ref 30.0–36.0)
MCV: 77.6 fL — ABNORMAL LOW (ref 78.0–100.0)
MONOS PCT: 9 % (ref 3–12)
Monocytes Absolute: 0.9 10*3/uL (ref 0.1–1.0)
Neutro Abs: 7.6 10*3/uL (ref 1.7–7.7)
Neutrophils Relative %: 72 % (ref 43–77)
PLATELETS: 275 10*3/uL (ref 150–400)
RBC: 3.43 MIL/uL — ABNORMAL LOW (ref 3.87–5.11)
RDW: 15.1 % (ref 11.5–15.5)
WBC: 10.5 10*3/uL (ref 4.0–10.5)

## 2015-07-28 LAB — GLUCOSE, CAPILLARY
GLUCOSE-CAPILLARY: 123 mg/dL — AB (ref 65–99)
GLUCOSE-CAPILLARY: 159 mg/dL — AB (ref 65–99)
GLUCOSE-CAPILLARY: 122 mg/dL — AB (ref 65–99)
Glucose-Capillary: 80 mg/dL (ref 65–99)
Glucose-Capillary: 163 mg/dL — ABNORMAL HIGH (ref 65–99)

## 2015-07-28 LAB — LEGIONELLA ANTIGEN, URINE

## 2015-07-28 LAB — URIC ACID: Uric Acid, Serum: 4.4 mg/dL (ref 2.3–6.6)

## 2015-07-28 LAB — MAGNESIUM: MAGNESIUM: 1.5 mg/dL — AB (ref 1.7–2.4)

## 2015-07-28 IMAGING — CR DG CHEST 1V PORT
1 series · 1 of 1 positions shown · non-contrast
Comparison: CT chest 01/25/2015.

CLINICAL DATA: Acute onset of respiratory distress earlier this
evening. Patient on BiPAP.

EXAM:
PORTABLE CHEST - 1 VIEW

[AP]
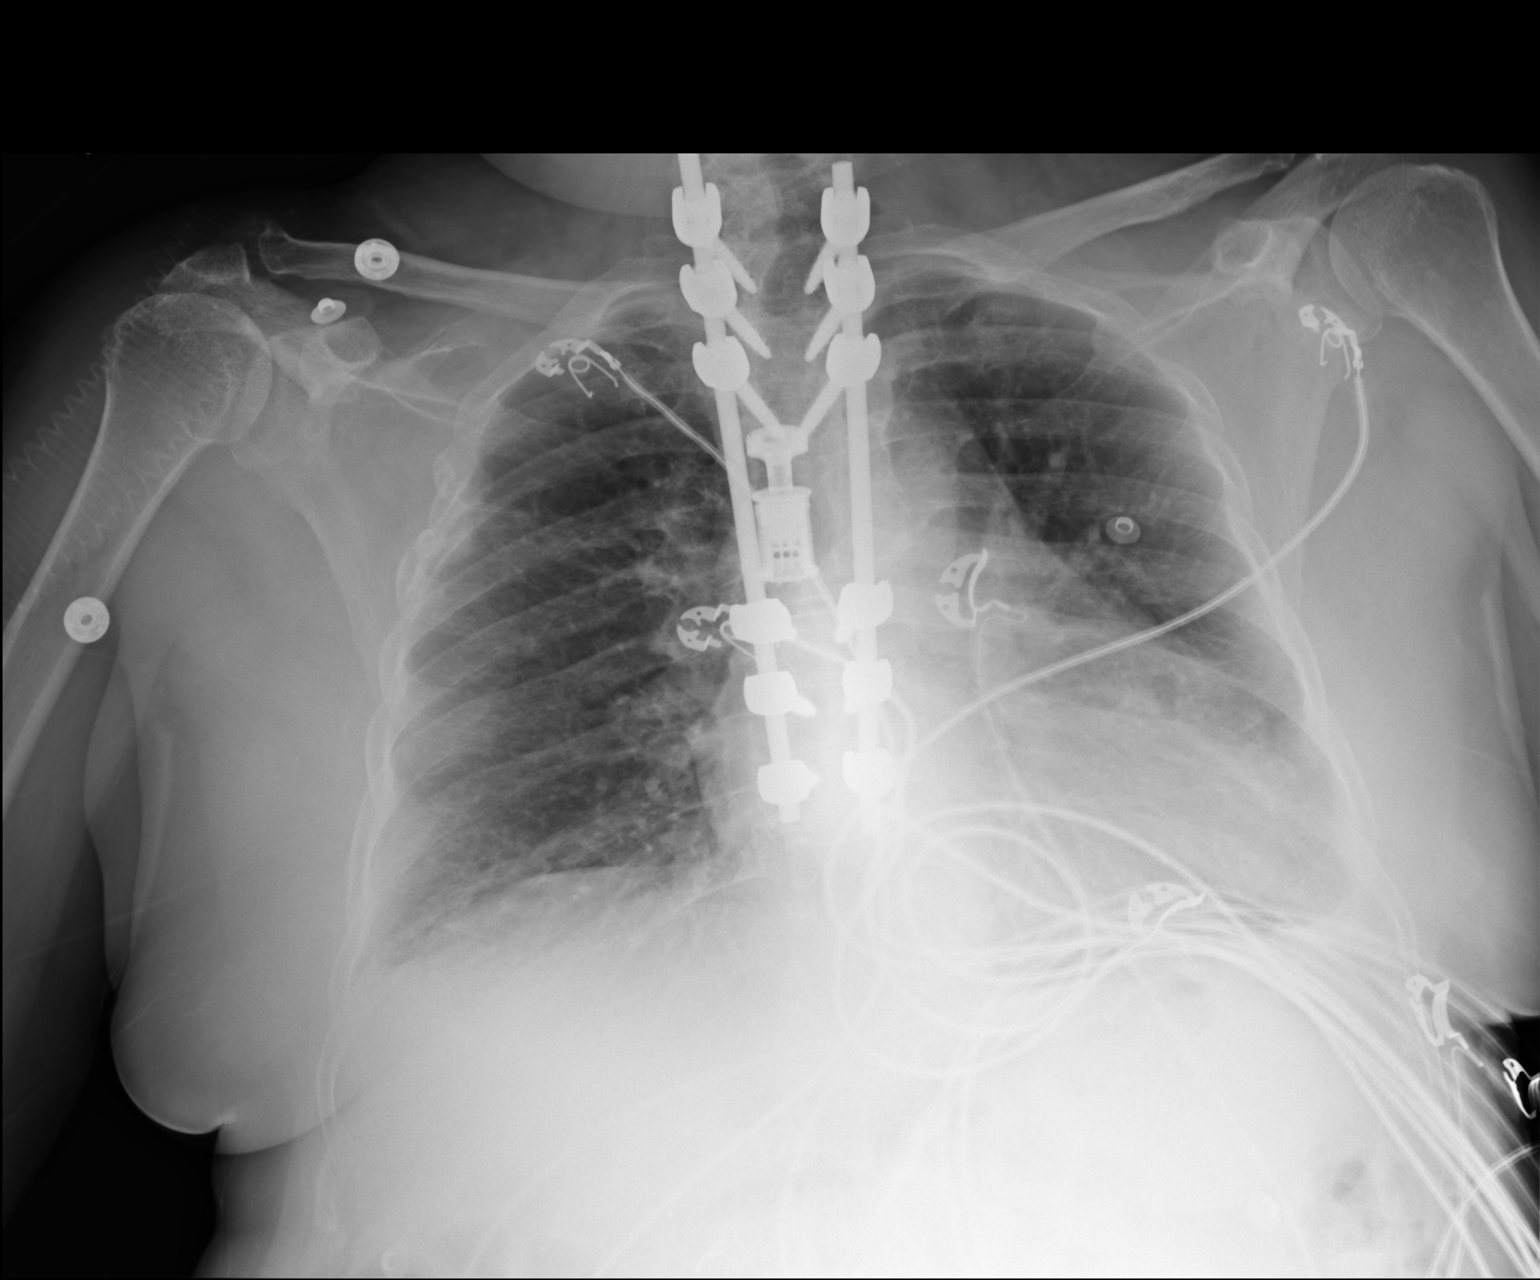

[1 of 1 positions shown; findings below may reference images not displayed]

FINDINGS: Suboptimal inspiration accounts for crowded bronchovascular markings
diffusely and atelectasis in the bases, and accentuates the cardiac
silhouette. Taking this into account, cardiac silhouette mildly
enlarged for AP portable technique, unchanged. Pleuroparenchymal
scarring in the right upper lobe adjacent to fractures of the right
lateral 3rd and 4th ribs. Lungs otherwise clear. Pulmonary
vascularity normal.
IMPRESSION: Suboptimal inspiration accounts for mild bibasilar atelectasis. No
acute cardiopulmonary disease otherwise. Stable mild cardiomegaly
without pulmonary edema.

## 2015-07-28 MED ORDER — OXYMORPHONE HCL ER 10 MG PO T12A: 40.0000 mg | EXTENDED_RELEASE_TABLET | Freq: Two times a day (BID) | ORAL | Status: DC

## 2015-07-28 MED ORDER — WHITE PETROLATUM GEL
Status: AC
  Administered 2015-07-28: 14:00:00
  Filled 2015-07-28: qty 1

## 2015-07-28 MED ORDER — LEVALBUTEROL HCL 0.63 MG/3ML IN NEBU
0.6300 mg | INHALATION_SOLUTION | RESPIRATORY_TRACT | Status: DC | PRN
  Administered 2015-07-29 – 2015-08-01 (×7): 0.63 mg via RESPIRATORY_TRACT
  Filled 2015-07-28 (×7): qty 3

## 2015-07-28 MED ORDER — MORPHINE SULFATE ER 15 MG PO TBCR
60.0000 mg | EXTENDED_RELEASE_TABLET | Freq: Two times a day (BID) | ORAL | Status: DC
  Administered 2015-07-28 (×2): 60 mg via ORAL
  Filled 2015-07-28 (×2): qty 4

## 2015-07-28 MED ORDER — GABAPENTIN 300 MG PO CAPS
300.0000 mg | ORAL_CAPSULE | Freq: Two times a day (BID) | ORAL | Status: DC
  Administered 2015-07-28 – 2015-08-02 (×11): 300 mg via ORAL
  Filled 2015-07-28 (×11): qty 1

## 2015-07-28 MED ORDER — POTASSIUM CHLORIDE CRYS ER 20 MEQ PO TBCR
40.0000 meq | EXTENDED_RELEASE_TABLET | Freq: Once | ORAL | Status: AC
  Administered 2015-07-28: 40 meq via ORAL
  Filled 2015-07-28: qty 2

## 2015-07-28 MED ORDER — OXYCODONE HCL 5 MG PO TABS
20.0000 mg | ORAL_TABLET | ORAL | Status: DC | PRN
  Administered 2015-07-28 – 2015-08-02 (×22): 20 mg via ORAL
  Filled 2015-07-28 (×22): qty 4

## 2015-07-28 MED ORDER — MAGNESIUM SULFATE 2 GM/50ML IV SOLN
2.0000 g | Freq: Once | INTRAVENOUS | Status: AC
  Administered 2015-07-28: 2 g via INTRAVENOUS
  Filled 2015-07-28: qty 50

## 2015-07-28 MED ORDER — OXYCODONE HCL 5 MG PO TABS
20.0000 mg | ORAL_TABLET | ORAL | Status: DC
  Administered 2015-07-28 (×2): 20 mg via ORAL
  Filled 2015-07-28 (×2): qty 4

## 2015-07-28 MED ORDER — HYDROMORPHONE HCL 1 MG/ML IJ SOLN
INTRAMUSCULAR | Status: AC
  Administered 2015-07-28: 1 mg
  Filled 2015-07-28: qty 1

## 2015-07-28 MED ORDER — POTASSIUM CHLORIDE CRYS ER 20 MEQ PO TBCR
40.0000 meq | EXTENDED_RELEASE_TABLET | Freq: Two times a day (BID) | ORAL | Status: AC
  Administered 2015-07-28 (×2): 40 meq via ORAL
  Filled 2015-07-28 (×2): qty 2

## 2015-07-28 MED ORDER — OXYCODONE HCL 5 MG PO TABS: 20.0000 mg | ORAL_TABLET | ORAL | Status: DC

## 2015-07-28 NOTE — Evaluation (Signed)
Occupational Therapy Evaluation Patient Details Name: Amber [name] MRN: [id number] DOB: Sep 22, 1956 Today's Date: 07/28/2015    History of Present Illness Amber [name] is a 59 y.o. 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   Clinical Impression   This 59 yo female admitted with above presents to acute OT with increased pain, decreased balance, decreased mobility, obesity all affecting her ability to care for herself at a Mod I level as she was at home pta--she will benefit from acute OT with follow up OT at SNF.   Follow Up Recommendations  SNF    Equipment Recommendations   (TBD next venue)       Precautions / Restrictions Precautions Precautions: Fall Restrictions Weight Bearing Restrictions: No      Mobility Bed Mobility Overal bed mobility: Needs Assistance Bed Mobility: Rolling;Sidelying to Sit Rolling: Min assist Sidelying to sit: Mod assist;HOB elevated          Transfers Overall transfer level: Needs assistance Equipment used: 1 person hand held assist Transfers: Sit to/from [employer] Sit to Stand: Min assist   Squat pivot transfers: Mod assist          Balance Overall balance assessment: Needs assistance Sitting-balance support: Bilateral upper extremity supported;Feet supported Sitting balance-Leahy Scale: Fair     Standing balance support: Bilateral upper extremity supported Standing balance-Leahy Scale: Poor                              ADL Overall ADL's : Needs assistance/impaired Eating/Feeding: Independent;Sitting   Grooming: Set up;Sitting   Upper Body Bathing: Set up;Sitting   Lower Body Bathing: Maximal assistance (with Min A sit<>stand from bed)   Upper Body Dressing :  Moderate assistance;Sitting   Lower Body Dressing: Total assistance (with Min A sit<>stand from bed)   Toilet Transfer: Moderate assistance;Squat-pivot (bed>recliner going to her left)   Toileting- Clothing Manipulation and Hygiene: Total assistance (with min A sit<>stand)               Vision Additional Comments: No change from baseline          Pertinent Vitals/Pain Pain Assessment: 0-10 Pain Score: 10-Worst pain ever Pain Location: All over Pain Descriptors / Indicators: Aching;Sore Pain Intervention(s): Monitored during session;Repositioned;Patient requesting pain meds-RN notified;RN gave pain meds during session (says ice helps more than heat, but did not want ice right now)     Hand Dominance Right   Extremity/Trunk Assessment Upper Extremity Assessment Upper Extremity Assessment: Overall WFL for tasks assessed   Lower Extremity Assessment Lower Extremity Assessment: Defer to PT evaluation       Communication Communication Communication: No difficulties   Cognition Arousal/Alertness: Awake/alert Behavior During Therapy: WFL for tasks assessed/performed Overall Cognitive Status: Within Functional Limits for tasks assessed                                Home Living Family/patient expects to be discharged to:: Skilled nursing facility Living Arrangements: Other relatives (grand-daughter) Available Help at Discharge: Family;Available PRN/intermittently (grand daughter is in [employer] school) Type of Home: House Home Access: Ramped entrance     Home Layout: One level  Home Equipment: Walker - 2 wheels;Hospital bed (see comments below)   Additional Comments: Says her first 4 wheeled RW got to where it would not work Production designer, theatre/television/film and wheels) she got her another one from [facility] but it was too small--returned it ot [facility] and they told her her Medicaid would not get her another one--she would like another 4  wheeled RW      Prior Functioning/Environment Level of Independence: Independent with assistive device(s)        Comments: Amb with rollator (does not have one now per her report) History of falls.    OT Diagnosis: Generalized weakness;Acute pain   OT Problem List: Decreased strength;Decreased activity tolerance;Impaired balance (sitting and/or standing);Pain;Obesity;Decreased knowledge of precautions;Decreased knowledge of use of DME or AE   OT Treatment/Interventions: Self-care/ADL training;Patient/family education;Balance training;Therapeutic activities;DME and/or AE instruction    OT Goals(Current goals can be found in the care plan section) Acute Rehab OT Goals Patient Stated Goal: to go to rehab then home OT Goal Formulation: With patient Time For Goal Achievement: 08/11/15 Potential to Achieve Goals: Good  OT Frequency: Min 2X/week   Barriers to D/C: Decreased caregiver support             End of Session Nurse Communication: Patient requests pain meds (chair alarm placed)  Activity Tolerance: Patient limited by pain Patient left: in chair;with call bell/phone within reach;with chair alarm set   Time: 8756-4332 OT Time Calculation (min): 20 min Charges:  OT General Charges $OT Visit: 1 Procedure OT Evaluation $Initial OT Evaluation Tier I: 1 Procedure  Evette Georges  [phone] 07/28/2015, 9:48 AM

## 2015-07-28 NOTE — Evaluation (Signed)
Physical Therapy Evaluation Patient Details Name: Amber [name] MRN: [id number] DOB: 09-21-1956 Today's Date: 07/28/2015   History of Present Illness  Amber [name] is a 59 y.o. 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  Clinical Impression  Patient presents with decreased independence and safety with mobility due to deficits listed below in PT problem list.  She will benefit from skilled PT in the acute setting to allow return home with intermittent family assist following SNF rehab stay.      Follow Up Recommendations SNF;Supervision for mobility/OOB    Equipment Recommendations  Other (comment) (rollator)    Recommendations for Other Services       Precautions / Restrictions Precautions Precautions: Fall Restrictions Weight Bearing Restrictions: No      Mobility  Bed Mobility Overal bed mobility: Needs Assistance Bed Mobility: Supine to Sit     Supine to sit: HOB elevated;Supervision     General bed mobility comments: getting up to go to the bathroom  Transfers Overall transfer level: Needs assistance Equipment used: None Transfers: Stand Pivot Transfers;Sit to/from Stand Sit to Stand: Min assist Stand pivot transfers: Min assist       General transfer comment: assist for safety due to unsafe technique, more cords in the way than safe and pt not patient to wait for cords moved out of the way.  Ambulation/Gait Ambulation/Gait assistance: Min assist;Min guard Ambulation Distance (Feet): 90 Feet Assistive device: Rolling walker (2 wheeled) Gait Pattern/deviations: Trunk flexed;Decreased stride length;Shuffle     General Gait Details: poor tolerance with c/o pain throughout which ultimately limited distance as well as c/o  fatigue  Stairs            Wheelchair Mobility    Modified Rankin (Stroke Patients Only)       Balance Overall balance assessment: Needs assistance         Standing balance support: Bilateral upper extremity supported Standing balance-Leahy Scale: Poor Standing balance comment: UE support for balance                             Pertinent Vitals/Pain Pain Score: 10-Worst pain ever Pain Location: ribs Pain Descriptors / Indicators: Aching;Sore Pain Intervention(s): Monitored during session;Limited activity within patient's tolerance;Repositioned;Ice applied    Home Living Family/patient expects to be discharged to:: Skilled nursing facility Living Arrangements: Other relatives (grandaughter) Available Help at Discharge: Family;Available PRN/intermittently Type of Home: House Home Access: Ramped entrance     Home Layout: One level Home Equipment: Walker - 2 wheels;Hospital bed Additional Comments: Says her first 4 wheeled RW got to where it would not work Production designer, theatre/television/film and wheels) she got her another one from [facility] but it was too small--returned it ot [facility] and they told her her Medicaid would not get her another one--she would like another 4 wheeled RW    Prior Function Level of Independence: Independent with assistive device(s)         Comments: Amb with rollator (does not have one now per her report) History of falls. reports too many to count in past 6 months     Hand Dominance        Extremity/Trunk Assessment  Lower Extremity Assessment: Generalized weakness (reports bad arthritis in right knee and needs knee replacement due to giving away, but not able to have surgery right now.)         Communication   Communication: No difficulties  Cognition Arousal/Alertness: Awake/alert Behavior During Therapy: Impulsive Overall Cognitive Status: Within Functional Limits for tasks assessed                       General Comments      Exercises        Assessment/Plan    PT Assessment Patient needs continued PT services  PT Diagnosis Generalized weakness;Acute pain;Difficulty walking   PT Problem List Decreased strength;Decreased activity tolerance;Decreased mobility;Decreased balance;Pain;Decreased knowledge of use of DME;Decreased knowledge of precautions  PT Treatment Interventions DME instruction;Gait training;Functional mobility training;Therapeutic activities;Therapeutic exercise;Patient/family education;Balance training   PT Goals (Current goals can be found in the Care Plan section) Acute Rehab PT Goals Patient Stated Goal: to go to rehab then home PT Goal Formulation: With patient Time For Goal Achievement: 08/11/15 Potential to Achieve Goals: Good    Frequency Min 3X/week   Barriers to discharge        Co-evaluation               End of Session Equipment Utilized During Treatment: Gait belt Activity Tolerance: Patient limited by fatigue;Patient limited by pain Patient left: in chair;with call bell/phone within reach;with chair alarm set;with nursing/sitter in room           Time: 1433-1450 PT Time Calculation (min) (ACUTE ONLY): 17 min   Charges:   PT Evaluation $Initial PT Evaluation Tier I: 1 Procedure     PT G Codes:        WYNN,CYNDI 2015/08/21, 3:02 PM  Sheran Lawless, PT [phone] 08/21/2015

## 2015-07-28 NOTE — Progress Notes (Signed)
When 10pm meds given patient questioned her meds and advised MD had stopped her gabapentin and oxycodone due to her falls at home. Around 11pm patient woke up from sleep moaning and crying out in pain, c/o of her ribs and back hurting. Tried non-pharmalogical methods of pain relief including warm packs, repositioning, emotional support providing but patient moaning louder, crying, restless in bed and using calling bell multiple times. Paged NP on call and advised her of change in medication regimen and patient's pain assessment. New orders received, explained plan of care to patient and meds given. Patient to discuss medication with rounding MD in the am. Patient reassessed at 0030 resting in bed with eyes closed with intermittent soft moaning.

## 2015-07-28 NOTE — Progress Notes (Signed)
[place] TEAM 1 - Stepdown/ICU TEAM Progress Note  Crysta Gulick GEZ:[id number] DOB: 19-May-1956 DOA: 07/26/2015 PCP: [name], MD  Admit HPI / Brief Narrative: 59 y.o. 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 She had a fall 3 days prior following which she started developing chest pain and worsening shortness of breath. In the ER patient was found to be short of breath and CXR showed possible infiltrates. Patient's d-dimer was elevated but she is allergic to contrast. Patient was empirically started on heparin and antibiotics for possible pneumonia and admitted for further management.   HPI/Subjective: She states her breathing is better when at rest, but that she still becomes very sob w/ exertion.  Her chest pain has essentially resolved, but she does report L lateral chest wall pain, and ongoing severe lower back pain.  She denies n/v, or abdom pain.    Assessment/Plan:  Acute respiratory failure with hypoxia due to R base > L base HCAP -Continue Vancomycin and Zosyn for HCAP -VQ scan negative -check sats w/ ambulation   Chest pain with elevated troponin -troponin peaked at 0.07 most likely secondary to demand ischemia - chest pain believed to be due to falls/injury, as nature of described pain not suggestive of USAP - pain in central chest resolved as of today   Chronic back pain  -S/P extensive prior back surgeries  -pt in severe pain today after pain meds greatly reduced yesterday - will resume home regimen in the watchful inpt environment and evaluate gait stability w/ PT/OT - ?rehab stay indicated   Frequent falls  -Patient is on extremely large amount of narcotics and benzodiazepine's which more than likely  has significantly contributed to patient's multiple falls - see discussion above - PT/OT to see  -avoid escalating narcotic tx beyond home regimen   Microcytic anemia -Check Fe studies - guaiac stool  HTN -BP currently well controlled   Pulmonary hypertension -PA pressure 61mm Hg as of 06/13/15 TTE  Diabetes mellitus type 2 -A1c pending -Lipid panel pending -CBG currently well controlled   Seizures -Continue Keppra 500 mg BID -Keppra level pending  Gout  -Obtain uric acid level  -continue allopurinol 300 mg daily  Code Status: FULL Family Communication: no family present at time of exam Disposition Plan: transfer to med bed - PT/OT evals - ?CIR/SNF rehab stay   Consultants: NA  Procedure/Significant Events: 9/5 CT head without contrast;-deep white matter hypodensities represent chronic microvascular ischemic changes. -Negative acute changes 9/6 VQ scan negative PE  Antibiotics: Azithromycin 1 dose Ceftriaxone 1 dose Zosyn 9/6 > Vancomycin 9/6 >  DVT prophylaxis: Subcutaneous heparin  Objective: Blood pressure 114/90, pulse 105, temperature 98.7 F (37.1 C), temperature source Oral, resp. rate 21, height 5' (1.524 m), weight 64.5 kg (142 lb 3.2 oz), SpO2 98 %.  Intake/Output Summary (Last 24 hours) at 07/28/15 1419 Last data filed at 07/28/15 1143  Gross per 24 hour  Intake   1875 ml  Output   1600 ml  Net    275 ml   Exam: General: No acute respiratory distress laying in bed  Lungs: mild bibasilar crackles - no wheeze  Cardiovascular: Regular rate and rhythm without murmur gallop or rub normal S1 and S2 Abdomen: Nontender, nondistended, overweight, soft,  bowel sounds positive, no rebound, no ascites, no appreciable mass Extremities: No significant cyanosis, clubbing, or edema bilateral lower extremities  Data Reviewed: Basic Metabolic Panel:  Recent Labs Lab 07/26/15 1931 07/27/15 0631 07/28/15 0238  NA 136 133* 137  K 4.2 3.5 3.2*  CL 103 103  108  CO2 23 19* 20*  GLUCOSE 201* 339* 137*  BUN 13 8 9   CREATININE 0.90 0.95 1.00  CALCIUM 9.2 8.4* 8.2*  MG  --   --  1.5*   Liver Function Tests:  Recent Labs Lab 07/26/15 1931 07/27/15 0631 07/28/15 0238  AST 31 33 20  ALT 27 25 19   ALKPHOS 131* 109 88  BILITOT 0.7 0.4 0.5  PROT 7.7 6.2* 5.6*  ALBUMIN 2.8* 2.2* 2.1*   CBC:  Recent Labs Lab 07/26/15 1931 07/27/15 0631 07/28/15 0238  WBC 11.7* 9.3 10.5  NEUTROABS  --  7.4 7.6  HGB 11.3* 9.4* 8.6*  HCT 34.7* 29.1* 26.6*  MCV 76.6* 77.4* 77.6*  PLT 336 285 275   Cardiac Enzymes:  Recent Labs Lab 07/26/15 1931 07/27/15 0145 07/27/15 0631 07/27/15 1230  TROPONINI 0.04* 0.07* 0.05* 0.05*   BNP (last 3 results)  Recent Labs  06/11/15 2255 07/26/15 1931 07/27/15 0150  BNP 51.1 37.8 41.3   CBG:  Recent Labs Lab 07/27/15 1122 07/27/15 1605 07/27/15 2120 07/28/15 0928 07/28/15 1144  GLUCAP 80 183* 190* 123* 159*    Recent Results (from the past 240 hour(s))  Blood culture (routine x 2)     Status: None (Preliminary result)   Collection Time: 07/26/15  8:01 PM  Result Value Ref Range Status   Specimen Description BLOOD LEFT ANTECUBITAL  Final   Special Requests BOTTLES DRAWN AEROBIC AND ANAEROBIC  5CC EACH  Final   Culture   Final    NO GROWTH 2 DAYS Performed at [facility]    Report Status PENDING  Incomplete  Blood culture (routine x 2)     Status: None (Preliminary result)   Collection Time: 07/26/15  8:15 PM  Result Value Ref Range Status   Specimen Description BLOOD RIGHT WRIST  Final   Special Requests BOTTLES DRAWN AEROBIC AND ANAEROBIC  5CC EACH  Final   Culture   Final    NO GROWTH 2 DAYS Performed at [facility]    Report Status PENDING  Incomplete  MRSA PCR Screening     Status: None   Collection Time: 07/26/15 11:17 PM  Result Value Ref Range Status   MRSA by PCR NEGATIVE NEGATIVE Final    Comment:        The GeneXpert MRSA Assay (FDA approved for NASAL  specimens only), is one component of a comprehensive MRSA colonization surveillance program. It is not intended to diagnose MRSA infection nor to guide or monitor treatment for MRSA infections.   Culture, blood (routine x 2) Call MD if unable to obtain prior to antibiotics being given     Status: None (Preliminary result)   Collection Time: 07/27/15  1:45 AM  Result Value Ref Range Status   Specimen Description BLOOD LEFT ARM  Final   Special Requests BOTTLES DRAWN AEROBIC AND ANAEROBIC 5CC  Final   Culture NO GROWTH 1 DAY  Final   Report Status PENDING  Incomplete  Culture, blood (routine x 2) Call MD if unable to obtain prior to antibiotics being given     Status: None (Preliminary result)   Collection Time: 07/27/15  1:50 AM  Result Value  Ref Range Status   Specimen Description BLOOD RIGHT ARM  Final   Special Requests BOTTLES DRAWN AEROBIC AND ANAEROBIC 5CC  Final   Culture NO GROWTH 1 DAY  Final   Report Status PENDING  Incomplete     Studies:  Recent x-ray studies have been reviewed in detail by the Attending Physician  Scheduled Meds:  Scheduled Meds: . allopurinol  300 mg Oral Daily  . amLODipine  5 mg Oral Daily  . antiseptic oral rinse  7 mL Mouth Rinse BID  . aspirin  325 mg Oral Daily  . folic acid  1 mg Oral Daily  . gabapentin  300 mg Oral BID  . heparin subcutaneous  5,000 Units Subcutaneous 3 times per day  . insulin aspart  0-9 Units Subcutaneous TID WC  . levalbuterol  0.63 mg Nebulization Q6H  . levETIRAcetam  500 mg Oral BID  . metoprolol succinate  25 mg Oral Daily  . mometasone-formoterol  2 puff Inhalation BID  . morphine  60 mg Oral Q12H  . oxyCODONE  20 mg Oral Q4H while awake  . pantoprazole  40 mg Oral Daily  . piperacillin-tazobactam  3.375 g Intravenous Q8H  . vancomycin  500 mg Intravenous Q12H  . white petrolatum        Time spent on care of this patient: 35 mins  Lonia Blood, MD [facility] For Consults/Admissions -  Flow Manager - [phone] Office  [phone]  Contact MD directly via text page:      [email or web address]      password [facility]  07/28/2015, 2:19 PM   LOS: 2 days

## 2015-07-29 ENCOUNTER — Inpatient Hospital Stay (HOSPITAL_COMMUNITY): Payer: Medicaid Other

## 2015-07-29 DIAGNOSIS — G8929 Other chronic pain: Secondary | ICD-10-CM

## 2015-07-29 DIAGNOSIS — M549 Dorsalgia, unspecified: Secondary | ICD-10-CM

## 2015-07-29 DIAGNOSIS — J9601 Acute respiratory failure with hypoxia: Secondary | ICD-10-CM

## 2015-07-29 DIAGNOSIS — E119 Type 2 diabetes mellitus without complications: Secondary | ICD-10-CM

## 2015-07-29 LAB — COMPREHENSIVE METABOLIC PANEL
ALK PHOS: 122 U/L (ref 38–126)
ALT: 24 U/L (ref 14–54)
ANION GAP: 9 (ref 5–15)
AST: 32 U/L (ref 15–41)
Albumin: 2.2 g/dL — ABNORMAL LOW (ref 3.5–5.0)
BILIRUBIN TOTAL: 0.7 mg/dL (ref 0.3–1.2)
BUN: 7 mg/dL (ref 6–20)
CALCIUM: 8.5 mg/dL — AB (ref 8.9–10.3)
CO2: 18 mmol/L — ABNORMAL LOW (ref 22–32)
CREATININE: 0.98 mg/dL (ref 0.44–1.00)
Chloride: 109 mmol/L (ref 101–111)
GFR calc non Af Amer: >60 mL/min (ref >60)
Glucose, Bld: 117 mg/dL — ABNORMAL HIGH (ref 65–99)
Potassium: 4.2 mmol/L (ref 3.5–5.1)
Sodium: 136 mmol/L (ref 135–145)
TOTAL PROTEIN: 6 g/dL — AB (ref 6.5–8.1)

## 2015-07-29 LAB — GLUCOSE, CAPILLARY
GLUCOSE-CAPILLARY: 139 mg/dL — AB (ref 65–99)
GLUCOSE-CAPILLARY: 153 mg/dL — AB (ref 65–99)
Glucose-Capillary: 130 mg/dL — ABNORMAL HIGH (ref 65–99)
Glucose-Capillary: 114 mg/dL — ABNORMAL HIGH (ref 65–99)

## 2015-07-29 LAB — RETICULOCYTES
RBC.: 3.74 MIL/uL — AB (ref 3.87–5.11)
RETIC CT PCT: 2.6 % (ref 0.4–3.1)
Retic Count, Absolute: 97.2 10*3/uL (ref 19.0–186.0)

## 2015-07-29 LAB — CBC
HCT: 28.8 % — ABNORMAL LOW (ref 36.0–46.0)
HEMOGLOBIN: 9.1 g/dL — AB (ref 12.0–15.0)
MCH: 24.7 pg — AB (ref 26.0–34.0)
MCHC: 31.6 g/dL (ref 30.0–36.0)
MCV: 78.3 fL (ref 78.0–100.0)
Platelets: 297 10*3/uL (ref 150–400)
RBC: 3.68 MIL/uL — AB (ref 3.87–5.11)
RDW: 15.4 % (ref 11.5–15.5)
WBC: 9.8 10*3/uL (ref 4.0–10.5)

## 2015-07-29 LAB — IRON AND TIBC
Iron: 19 ug/dL — ABNORMAL LOW (ref 28–170)
SATURATION RATIOS: 9 % — AB (ref 10.4–31.8)
TIBC: 209 ug/dL — ABNORMAL LOW (ref 250–450)
UIBC: 190 ug/dL

## 2015-07-29 LAB — HEMOGLOBIN A1C
Hgb A1c MFr Bld: 5.5 % (ref 4.8–5.6)
Mean Plasma Glucose: 111 mg/dL

## 2015-07-29 LAB — PROCALCITONIN: Procalcitonin: 1.11 ng/mL

## 2015-07-29 LAB — MAGNESIUM: Magnesium: 1.9 mg/dL (ref 1.7–2.4)

## 2015-07-29 LAB — FOLATE: Folate: 42 ng/mL (ref >5.9)

## 2015-07-29 LAB — VITAMIN B12: VITAMIN B 12: 600 pg/mL (ref 180–914)

## 2015-07-29 LAB — FERRITIN: FERRITIN: 112 ng/mL (ref 11–307)

## 2015-07-29 IMAGING — NM NM PULMONARY VENT & PERF
16 series · 16 of 16 positions shown · non-contrast
Comparison: Single view of the chest this same day.

CLINICAL DATA: Dyspnea and shortness of breath today.

EXAM:
NUCLEAR MEDICINE VENTILATION - PERFUSION LUNG SCAN
TECHNIQUE: Ventilation images were obtained in multiple projections using
inhaled aerosol 5c-DDm DTPA. Perfusion images were obtained in
multiple projections after intravenous injection of 5c-DDm MAA.
RADIOPHARMACEUTICALS:  42.4 Iechnetium-88m DTPA aerosol inhalation
and 5.9 Iechnetium-88m MAA IV

[Series 1: ant/post vent · 4.14mm/px · 1 of 1 slices shown (1 of 2)]
[im 1/1]
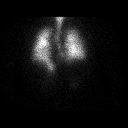

[Series 1: ant/post vent · 4.14mm/px · 1 of 1 slices shown (2 of 2)]
[im 1/1]
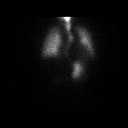

[Series 2: lao/rpo vent · 4.14mm/px · 1 of 1 slices shown (1 of 2)]
[im 1/1]
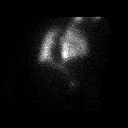

[Series 2: lao/rpo vent · 4.14mm/px · 1 of 1 slices shown (2 of 2)]
[im 1/1]
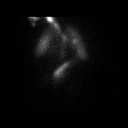

[Series 3: lpo/rao vent · 4.14mm/px · 1 of 1 slices shown (1 of 2)]
[im 1/1]
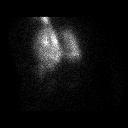

[Series 3: lpo/rao vent · 4.14mm/px · 1 of 1 slices shown (2 of 2)]
[im 1/1]
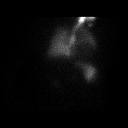

[Series 4: lt lat/rt lat vent · 4.14mm/px · 1 of 1 slices shown (1 of 2)]
[im 1/1]
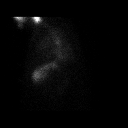

[Series 4: lt lat/rt lat vent · 4.14mm/px · 1 of 1 slices shown (2 of 2)]
[im 1/1]
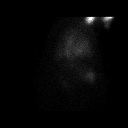

[Series 5: lt lat/rt lat perf · 4.14mm/px · 1 of 1 slices shown (1 of 2)]
[im 1/1]
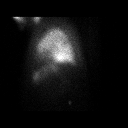

[Series 5: lt lat/rt lat perf · 4.14mm/px · 1 of 1 slices shown (2 of 2)]
[im 1/1]
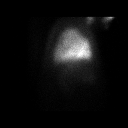

[Series 6: lpo/rao perf · 4.14mm/px · 1 of 1 slices shown (1 of 2)]
[im 1/1]
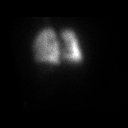

[Series 6: lpo/rao perf · 4.14mm/px · 1 of 1 slices shown (2 of 2)]
[im 1/1]
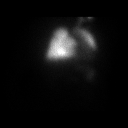

[Series 7: ant/post perf · 4.14mm/px · 1 of 1 slices shown (1 of 2)]
[im 1/1]
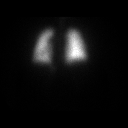

[Series 7: ant/post perf · 4.14mm/px · 1 of 1 slices shown (2 of 2)]
[im 1/1]
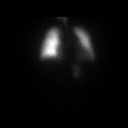

[Series 8: lao/rpo perf · 4.14mm/px · 1 of 1 slices shown (1 of 2)]
[im 1/1]
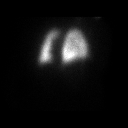

[Series 8: lao/rpo perf · 4.14mm/px · 1 of 1 slices shown (2 of 2)]
[im 1/1]
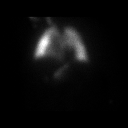

[16 of 16 positions shown; findings below may reference images not displayed]

FINDINGS: Ventilation: No focal ventilation defect.

Perfusion: No wedge shaped peripheral perfusion defects to suggest
acute pulmonary embolism.
IMPRESSION: Negative exam.

## 2015-07-29 MED ORDER — OXYMORPHONE HCL ER 10 MG PO T12A: 20.0000 mg | EXTENDED_RELEASE_TABLET | Freq: Two times a day (BID) | ORAL | Status: DC

## 2015-07-29 MED ORDER — OXYMORPHONE HCL ER 10 MG PO T12A
40.0000 mg | EXTENDED_RELEASE_TABLET | Freq: Two times a day (BID) | ORAL | Status: DC
  Administered 2015-07-29 – 2015-08-02 (×9): 40 mg via ORAL
  Filled 2015-07-29 (×9): qty 4

## 2015-07-29 MED ORDER — LEVOFLOXACIN 500 MG PO TABS
500.0000 mg | ORAL_TABLET | Freq: Every day | ORAL | Status: DC
  Administered 2015-07-29 – 2015-08-02 (×5): 500 mg via ORAL
  Filled 2015-07-29 (×5): qty 1

## 2015-07-29 NOTE — Progress Notes (Signed)
[place] TEAM 1 Transfer 9/8  Amber [name] QGB:[id number] DOB: Jan 29, 1956 DOA: 07/26/2015 PCP: [name], MD  Admit HPI / Brief Narrative: 59 y.o. 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 She had a fall 3 days prior following which she started developing chest pain, cough  and worsening shortness of breath. In the ER patient was found to be short of breath and CXR showed possible infiltrates. Patient's d-dimer was elevated but she is allergic to contrast. Patient was empirically started on heparin and antibiotics for possible pneumonia and admitted for further management.   HPI/Subjective: Breathing improved, c/o chronic back pain  Assessment/Plan:  Acute respiratory failure with hypoxia due to R base > L base HCAP -improving -was on Vancomycin and Zosyn for HCAP, change to PO levaquin today -VQ scan negative -weaned off O2 -Blood and sputum cx negative  Chest pain with elevated troponin -troponin peaked at 0.07 most likely secondary to demand ischemia - chest pain pleritic believed to be due to falls/injury, pneumonia -resolved, ECHO with normal EF and wall motion -VQ scan negative  Chronic back pain  -S/P extensive prior back surgeries  -continue home regimen of Opana and Oxycodone PRN as prescribed by her PCP for >3years  Frequent falls  --related to chronic back problems and polypharmacy -Pt following, SNF recommended, CSW to see -avoid escalating narcotic tx beyond home regimen   Iron deficiency anemia  -hb stable -needs colonoscopy as outpatient  HTN -BP currently well controlled   Pulmonary hypertension -PA pressure 79mm Hg as of 06/13/15 TTE  Diabetes mellitus type 2 -CBG currently well  controlled   Seizures -Continue Keppra 500 mg BID  Gout  -continue allopurinol 300 mg daily  DVT proph: SQ heparin  Code Status: FULL Family Communication: no family present at time of exam Disposition Plan:? SNF tomorrow  Consultants: NA  Procedure/Significant Events: 9/5 CT head without contrast;-deep white matter hypodensities represent chronic microvascular ischemic changes. -Negative acute changes 9/6 VQ scan negative PE  Antibiotics: Azithromycin 1 dose Ceftriaxone 1 dose Zosyn 9/6 > Vancomycin 9/6 >  Objective: Blood pressure 119/77, pulse 106, temperature 98.6 F (37 C), temperature source Oral, resp. rate 27, height 5' (1.524 m), weight 66.2 kg (145 lb 15.1 oz), SpO2 95 %.  Intake/Output Summary (Last 24 hours) at 07/29/15 1106 Last data filed at 07/29/15 1000  Gross per 24 hour  Intake   1420 ml  Output   2451 ml  Net  -1031 ml   Exam: General: No acute respiratory distress, AAOx3 Lungs: mild bibasilar crackles - no wheeze  Cardiovascular: Regular rate and rhythm without murmur gallop or rub normal S1 and S2 Abdomen: Nontender, nondistended, overweight, soft, bowel sounds positive, no rebound, no ascites, no appreciable mass Extremities: No significant cyanosis, clubbing, or edema bilateral lower extremities  Data Reviewed: Basic Metabolic Panel:  Recent Labs Lab 07/26/15 1931 07/27/15 0631 07/28/15 0238 07/29/15 0512  NA 136 133* 137 136  K 4.2 3.5 3.2* 4.2  CL 103 103 108 109  CO2 23 19* 20* 18*  GLUCOSE 201* 339* 137* 117*  BUN 13 8 9 7   CREATININE 0.90 0.95 1.00 0.98  CALCIUM 9.2 8.4* 8.2* 8.5*  MG  --   --  1.5* 1.9   Liver  Function Tests:  Recent Labs Lab 07/26/15 1931 07/27/15 0631 07/28/15 0238 07/29/15 0512  AST 31 33 20 32  ALT 27 25 19 24   ALKPHOS 131* 109 88 122  BILITOT 0.7 0.4 0.5 0.7  PROT 7.7 6.2* 5.6* 6.0*  ALBUMIN 2.8* 2.2* 2.1* 2.2*   CBC:  Recent Labs Lab 07/26/15 1931 07/27/15 0631 07/28/15 0238  07/29/15 0512  WBC 11.7* 9.3 10.5 9.8  NEUTROABS  --  7.4 7.6  --   HGB 11.3* 9.4* 8.6* 9.1*  HCT 34.7* 29.1* 26.6* 28.8*  MCV 76.6* 77.4* 77.6* 78.3  PLT 336 285 275 297   Cardiac Enzymes:  Recent Labs Lab 07/26/15 1931 07/27/15 0145 07/27/15 0631 07/27/15 1230  TROPONINI 0.04* 0.07* 0.05* 0.05*   BNP (last 3 results)  Recent Labs  06/11/15 2255 07/26/15 1931 07/27/15 0150  BNP 51.1 37.8 41.3   CBG:  Recent Labs Lab 07/27/15 2120 07/28/15 0928 07/28/15 1144 07/28/15 1806 07/28/15 2149  GLUCAP 190* 123* 159* 163* 122*    Recent Results (from the past 240 hour(s))  Blood culture (routine x 2)     Status: None (Preliminary result)   Collection Time: 07/26/15  8:01 PM  Result Value Ref Range Status   Specimen Description BLOOD LEFT ANTECUBITAL  Final   Special Requests BOTTLES DRAWN AEROBIC AND ANAEROBIC  5CC EACH  Final   Culture   Final    NO GROWTH 2 DAYS Performed at [facility]    Report Status PENDING  Incomplete  Blood culture (routine x 2)     Status: None (Preliminary result)   Collection Time: 07/26/15  8:15 PM  Result Value Ref Range Status   Specimen Description BLOOD RIGHT WRIST  Final   Special Requests BOTTLES DRAWN AEROBIC AND ANAEROBIC  5CC EACH  Final   Culture   Final    NO GROWTH 2 DAYS Performed at [facility]    Report Status PENDING  Incomplete  MRSA PCR Screening     Status: None   Collection Time: 07/26/15 11:17 PM  Result Value Ref Range Status   MRSA by PCR NEGATIVE NEGATIVE Final    Comment:        The GeneXpert MRSA Assay (FDA approved for NASAL specimens only), is one component of a comprehensive MRSA colonization surveillance program. It is not intended to diagnose MRSA infection nor to guide or monitor treatment for MRSA infections.   Culture, blood (routine x 2) Call MD if unable to obtain prior to antibiotics being given     Status: None (Preliminary result)   Collection Time: 07/27/15  1:45 AM   Result Value Ref Range Status   Specimen Description BLOOD LEFT ARM  Final   Special Requests BOTTLES DRAWN AEROBIC AND ANAEROBIC 5CC  Final   Culture NO GROWTH 1 DAY  Final   Report Status PENDING  Incomplete  Culture, blood (routine x 2) Call MD if unable to obtain prior to antibiotics being given     Status: None (Preliminary result)   Collection Time: 07/27/15  1:50 AM  Result Value Ref Range Status   Specimen Description BLOOD RIGHT ARM  Final   Special Requests BOTTLES DRAWN AEROBIC AND ANAEROBIC 5CC  Final   Culture NO GROWTH 1 DAY  Final   Report Status PENDING  Incomplete     Studies:  Recent x-ray studies have been reviewed in detail by the Attending Physician  Scheduled Meds:  Scheduled Meds: . allopurinol  300 mg Oral  Daily  . amLODipine  5 mg Oral Daily  . antiseptic oral rinse  7 mL Mouth Rinse BID  . aspirin  325 mg Oral Daily  . folic acid  1 mg Oral Daily  . gabapentin  300 mg Oral BID  . heparin subcutaneous  5,000 Units Subcutaneous 3 times per day  . insulin aspart  0-9 Units Subcutaneous TID WC  . levETIRAcetam  500 mg Oral BID  . levofloxacin  500 mg Oral Daily  . metoprolol succinate  25 mg Oral Daily  . mometasone-formoterol  2 puff Inhalation BID  . oxymorphone  40 mg Oral Q12H  . pantoprazole  40 mg Oral Daily    Time spent on care of this patient: 35 mins  Zannie Cove [facility] Office  [phone]  Contact MD directly via text page:      [email or web address]      password [facility]  07/29/2015, 11:06 AM   LOS: 3 days

## 2015-07-29 NOTE — Clinical Social Work Note (Signed)
Clinical Social Work Assessment  Patient Details  Name: Amber [name] MRN: [id number] Date of Birth: [birth year]  Date of referral:  07/29/15               Reason for consult:  Facility Placement                Permission sought to share information with:  Case Manager Permission granted to share information::  Yes, Verbal Permission Granted  Name::        Agency::     Relationship::     Contact Information:     Housing/Transportation Living arrangements for the past 2 months:  Single Family Home Source of Information:  Patient Patient Interpreter Needed:  None Criminal Activity/Legal Involvement Pertinent to Current Situation/Hospitalization:  No - Comment as needed Significant Relationships:  Adult Children, Other Family Members Lives with:  Other (Comment) (Granddaughter) Do you feel safe going back to the place where you live?  Yes Need for family participation in patient care:  No (Coment)  Care giving concerns: Pt needing assistance with ADLs   Social Worker assessment / plan:  CSW visited pt room to discuss SNF recommendation. Pt informed CSW she lives with her granddaughter and has support from her daughter and son. She plans to dc home and is not open to SNF placement at this time. Her granddaughter is able to provide 24 hr supervision/assistance. Pt has no concerns for safety at home.  Employment status:    Insurance information:  Medicaid In [place] PT Recommendations:  Skilled Nursing Facility Information / Referral to community resources:  Other (Comment Required) (Declined SNF information)  Patient/Family's Response to care:  Pt pleasant but declining SNF at this time.  Patient/Family's Understanding of and Emotional Response to Diagnosis, Current Treatment, and Prognosis: Pt has fair insight on her condition. Pt with appropriate emotional response.   Emotional Assessment Appearance:  Appears stated age, Well-Groomed Attitude/Demeanor/Rapport:  Other  (Cooperative) Affect (typically observed):  Calm, Pleasant Orientation:  Oriented to Self, Oriented to Place, Oriented to  Time, Oriented to Situation Alcohol / Substance use:  Not Applicable Psych involvement (Current and /or in the community):  No (Comment)  Discharge Needs  Concerns to be addressed:  Discharge Planning Concerns Readmission within the last 30 days:    Current discharge risk:  Dependent with Mobility Barriers to Discharge:  Continued Medical Work up   [employer] [phone]

## 2015-07-29 NOTE — Care Management Note (Signed)
Case Management Note  Patient Details  Name: Karington Zarazua MRN: [id number] Date of Birth: 06/07/1956  Subjective/Objective:     Pt adm w resp failure               Action/Plan:pt lives w Doneta Public. She was rec snf and spoke w sw. Pt wants to go home w granda. She would like rolator rolling walker w seat. She does not hhrn but is working on [employer] to help w cleaning and meals. Pt has inform when i called numbers could not locate agency but pt has talked w agency about her pcs. Will cont to follow   Expected Discharge Date:                  Expected Discharge Plan:  Home/Self Care  In-House Referral:  Clinical Social Work  Discharge planning Services     Post Acute Care Choice:    Choice offered to:     DME Arranged:    DME Agency:     HH Arranged:  PCS/Personal Care Services [facility] Agency:  Other - See comment  Status of Service:     Medicare Important Message Given:    Date Medicare IM Given:    Medicare IM give by:    Date Additional Medicare IM Given:    Additional Medicare Important Message give by:     If discussed at Long Length of Stay Meetings, dates discussed:    Additional Comments:  Hanley Hays, RN 07/29/2015, 2:29 PM

## 2015-07-30 DIAGNOSIS — I10 Essential (primary) hypertension: Secondary | ICD-10-CM

## 2015-07-30 LAB — BASIC METABOLIC PANEL
ANION GAP: 9 (ref 5–15)
BUN: 5 mg/dL — ABNORMAL LOW (ref 6–20)
CALCIUM: 8.8 mg/dL — AB (ref 8.9–10.3)
CHLORIDE: 106 mmol/L (ref 101–111)
CO2: 19 mmol/L — AB (ref 22–32)
Creatinine, Ser: 0.9 mg/dL (ref 0.44–1.00)
GFR calc non Af Amer: >60 mL/min (ref >60)
GLUCOSE: 132 mg/dL — AB (ref 65–99)
Potassium: 4.2 mmol/L (ref 3.5–5.1)
Sodium: 134 mmol/L — ABNORMAL LOW (ref 135–145)

## 2015-07-30 LAB — CBC
HEMATOCRIT: 28.4 % — AB (ref 36.0–46.0)
HEMOGLOBIN: 9.1 g/dL — AB (ref 12.0–15.0)
MCH: 25.4 pg — AB (ref 26.0–34.0)
MCHC: 32 g/dL (ref 30.0–36.0)
MCV: 79.3 fL (ref 78.0–100.0)
Platelets: 297 10*3/uL (ref 150–400)
RBC: 3.58 MIL/uL — AB (ref 3.87–5.11)
RDW: 15.3 % (ref 11.5–15.5)
WBC: 11.3 10*3/uL — ABNORMAL HIGH (ref 4.0–10.5)

## 2015-07-30 LAB — GLUCOSE, CAPILLARY
GLUCOSE-CAPILLARY: 200 mg/dL — AB (ref 65–99)
GLUCOSE-CAPILLARY: 142 mg/dL — AB (ref 65–99)
Glucose-Capillary: 131 mg/dL — ABNORMAL HIGH (ref 65–99)
Glucose-Capillary: 145 mg/dL — ABNORMAL HIGH (ref 65–99)

## 2015-07-30 LAB — LEVETIRACETAM LEVEL: Levetiracetam Lvl: 23.5 ug/mL (ref 10.0–40.0)

## 2015-07-30 MED ORDER — WHITE PETROLATUM GEL
Status: AC
  Filled 2015-07-30: qty 1

## 2015-07-30 NOTE — Progress Notes (Signed)
Physical Therapy Treatment Patient Details Name: Amber [name] MRN: [id number] DOB: [birth year] Today's Date: 07/30/2015    History of Present Illness Amber [name] is a 59 y.o. 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    PT Comments    Pt is making progress with mobility requiring overall min A for transfers and gait with RW. Pt and family want to go home now instead of SNF with recommendation for HHPT for follow up. Pt reports family can provide 24/7 assist. Continue to follow acutely to address overall strength, balance, functional mobility, and endurance to prepare for d/c home with family support.    Follow Up Recommendations  Home health PT;Supervision/Assistance - 24 hour (d/c plan changed to Home with family providing 24/7)     Equipment Recommendations  Other (comment) (rollator)    Recommendations for Other Services       Precautions / Restrictions Precautions Precautions: Fall Restrictions Weight Bearing Restrictions: No    Mobility  Bed Mobility Overal bed mobility: Needs Assistance Bed Mobility: Supine to Sit     Supine to sit: Supervision;HOB elevated     General bed mobility comments: Pt asked for assist with BLE but with encouragement, pt able to perform with verbal cues only  Transfers Overall transfer level: Needs assistance Equipment used: Rolling walker (2 wheeled) Transfers: Sit to/from Stand Sit to Stand: Min guard;Min assist         General transfer comment: Steady A from EOB with cues for hand placement and technique as well as to maintain upright posture. From toilet (lower surface) required min A for initiating forward weightshift but then able to perform the rest of the sit to stand with light steady A for safety.    Ambulation/Gait Ambulation/Gait assistance: Min assist Ambulation Distance (Feet): 15 Feet Assistive device: Rolling walker (2 wheeled) Gait Pattern/deviations: Trunk flexed;Shuffle;Decreased stride length     General Gait Details: Pt with tendency to lean on elbows on the RW for support during gait to bathroom. Pt able to negotiate lip of bathroom entrance with verbal cues for safety and steady A for balance. Pt declines further gait due to generalized pain today.   Stairs            Wheelchair Mobility    Modified Rankin (Stroke Patients Only)       Balance Overall balance assessment: Needs assistance Sitting-balance support: Bilateral upper extremity supported;No upper extremity supported;Feet supported Sitting balance-Leahy Scale: Good     Standing balance support: Bilateral upper extremity supported;Single extremity supported;During functional activity Standing balance-Leahy Scale: Poor Standing balance comment: requires at least 1 UE support for balance                    Cognition Arousal/Alertness: Awake/alert Behavior During Therapy: WFL for tasks assessed/performed Overall Cognitive Status: Within Functional Limits for tasks assessed                      Exercises      General Comments        Pertinent Vitals/Pain Pain Assessment: 0-10 Pain Score: 10-Worst pain ever Pain Location: all over especially abdomen Pain Descriptors / Indicators: Aching Pain Intervention(s): Monitored during session;Premedicated before session;Repositioned    Home Living  Prior Function            PT Goals (current goals can now be found in the care plan section) Acute Rehab PT Goals PT Goal Formulation: With patient Time For Goal Achievement: 08/11/15 Potential to Achieve Goals: Good Progress towards PT goals: Progressing toward goals    Frequency  Min 3X/week    PT Plan Discharge plan needs to be updated     Co-evaluation             End of Session   Activity Tolerance: Patient limited by pain;Patient tolerated treatment well Patient left: in chair;with call bell/phone within reach     Time: 1025-1042 PT Time Calculation (min) (ACUTE ONLY): 17 min  Charges:  $Therapeutic Activity: 8-22 mins                    G Codes:      Karolee Stamps Darrol Poke, PT, DPT Pager #: [phone]  07/30/2015, 10:53 AM

## 2015-07-30 NOTE — Care Management (Signed)
Transitional Care Clinic:  This Case Manager met with patient to inform of the [facility] at [facility] and to discuss the follow-up and medical management provided by the clinic. Patient not interested in follow-up at the [facility]. She indicated she plans to see her PCP Dr. Sandi Mariscal after discharge.  Amber Spatz, RN CM updated.

## 2015-07-30 NOTE — Progress Notes (Signed)
[place] TEAM 1 Transfer 9/8  Amber [name] WSF:[id number] DOB: [birth year] DOA: 07/26/2015 PCP: [name], MD  Admit HPI / Brief Narrative: 59 y.o. 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 She had a fall 3 days prior following which she started developing chest pain, cough  and worsening shortness of breath. In the ER patient was found to be short of breath and CXR showed possible infiltrates. Patient's d-dimer was elevated but she is allergic to contrast. Patient was empirically started on heparin and antibiotics for possible pneumonia and admitted for further management.   HPI/Subjective: Breathing improved, c/o chronic back pain, c/o mild L sided abd pain  Assessment/Plan:  Acute respiratory failure with hypoxia due to R base > L base HCAP -improving -was on Vancomycin and Zosyn for HCAP, changed to PO levaquin 9/8 -VQ scan negative -weaned off O2 -Blood and sputum cx negative  Chest pain with elevated troponin -troponin peaked at 0.07 most likely secondary to demand ischemia - chest pain pleritic believed to be due to falls/injury, pneumonia -resolved, ECHO with normal EF and wall motion -VQ scan negative  Chronic back pain  -S/P extensive prior back surgeries  -continue home regimen of Opana and Oxycodone PRN as prescribed by her PCP for >3years  Frequent falls  --related to chronic back problems and polypharmacy -Pt following, SNF recommended, pt declines SNF, will go home with HHPT -avoid escalating narcotic tx beyond home regimen   Iron deficiency anemia  -hb stable -needs colonoscopy as outpatient  HTN -BP currently well controlled   Pulmonary hypertension -PA pressure 67mm Hg as of 06/13/15  TTE  Diabetes mellitus type 2 -CBG currently well controlled   Seizures -Continue Keppra 500 mg BID  Gout  -continue allopurinol 300 mg daily  DVT proph: SQ heparin  Code Status: FULL Family Communication: no family at bedside Disposition Plan:Home tomorrow, declines SNF  Consultants: NA  Procedure/Significant Events: 9/5 CT head without contrast;-deep white matter hypodensities represent chronic microvascular ischemic changes. -Negative acute changes 9/6 VQ scan negative PE  Antibiotics: Azithromycin 1 dose Ceftriaxone 1 dose Zosyn 9/6 > Vancomycin 9/6 >  Objective: Blood pressure 104/65, pulse 103, temperature 99.3 F (37.4 C), temperature source Oral, resp. rate 20, height 5' (1.524 m), weight 67.1 kg (147 lb 14.9 oz), SpO2 98 %.  Intake/Output Summary (Last 24 hours) at 07/30/15 1256 Last data filed at 07/30/15 0900  Gross per 24 hour  Intake    600 ml  Output    800 ml  Net   -200 ml   Exam: General: No acute respiratory distress, AAOx3 Lungs: mild bibasilar crackles - no wheeze  Cardiovascular: Regular rate and rhythm without murmur gallop or rub normal S1 and S2 Abdomen: soft, Mild LUQ tenderness,  nondistended, bowel sounds positive, no rebound, no ascites, no appreciable mass Extremities: No significant cyanosis, clubbing, or edema bilateral lower extremities  Data Reviewed: Basic Metabolic Panel:  Recent Labs Lab 07/26/15 1931 07/27/15 0631 07/28/15 0238 07/29/15 0512 07/30/15 0404  NA 136 133* 137 136 134*  K 4.2 3.5 3.2* 4.2 4.2  CL 103 103 108 109 106  CO2 23 19* 20* 18* 19*  GLUCOSE 201* 339* 137* 117* 132*  BUN 13 8 9 7  5*  CREATININE 0.90  0.95 1.00 0.98 0.90  CALCIUM 9.2 8.4* 8.2* 8.5* 8.8*  MG  --   --  1.5* 1.9  --    Liver Function Tests:  Recent Labs Lab 07/26/15 1931 07/27/15 0631 07/28/15 0238 07/29/15 0512  AST 31 33 20 32  ALT 27 25 19 24   ALKPHOS 131* 109 88 122  BILITOT 0.7 0.4 0.5 0.7  PROT 7.7 6.2* 5.6* 6.0*   ALBUMIN 2.8* 2.2* 2.1* 2.2*   CBC:  Recent Labs Lab 07/26/15 1931 07/27/15 0631 07/28/15 0238 07/29/15 0512 07/30/15 0404  WBC 11.7* 9.3 10.5 9.8 11.3*  NEUTROABS  --  7.4 7.6  --   --   HGB 11.3* 9.4* 8.6* 9.1* 9.1*  HCT 34.7* 29.1* 26.6* 28.8* 28.4*  MCV 76.6* 77.4* 77.6* 78.3 79.3  PLT 336 285 275 297 297   Cardiac Enzymes:  Recent Labs Lab 07/26/15 1931 07/27/15 0145 07/27/15 0631 07/27/15 1230  TROPONINI 0.04* 0.07* 0.05* 0.05*   BNP (last 3 results)  Recent Labs  06/11/15 2255 07/26/15 1931 07/27/15 0150  BNP 51.1 37.8 41.3   CBG:  Recent Labs Lab 07/29/15 1136 07/29/15 1617 07/29/15 2137 07/30/15 0740 07/30/15 1216  GLUCAP 139* 114* 153* 200* 131*    Recent Results (from the past 240 hour(s))  Blood culture (routine x 2)     Status: None (Preliminary result)   Collection Time: 07/26/15  8:01 PM  Result Value Ref Range Status   Specimen Description BLOOD LEFT ANTECUBITAL  Final   Special Requests BOTTLES DRAWN AEROBIC AND ANAEROBIC  5CC EACH  Final   Culture   Final    NO GROWTH 3 DAYS Performed at [facility]    Report Status PENDING  Incomplete  Blood culture (routine x 2)     Status: None (Preliminary result)   Collection Time: 07/26/15  8:15 PM  Result Value Ref Range Status   Specimen Description BLOOD RIGHT WRIST  Final   Special Requests BOTTLES DRAWN AEROBIC AND ANAEROBIC  5CC EACH  Final   Culture   Final    NO GROWTH 3 DAYS Performed at [facility]    Report Status PENDING  Incomplete  MRSA PCR Screening     Status: None   Collection Time: 07/26/15 11:17 PM  Result Value Ref Range Status   MRSA by PCR NEGATIVE NEGATIVE Final    Comment:        The GeneXpert MRSA Assay (FDA approved for NASAL specimens only), is one component of a comprehensive MRSA colonization surveillance program. It is not intended to diagnose MRSA infection nor to guide or monitor treatment for MRSA infections.   Culture, blood  (routine x 2) Call MD if unable to obtain prior to antibiotics being given     Status: None (Preliminary result)   Collection Time: 07/27/15  1:45 AM  Result Value Ref Range Status   Specimen Description BLOOD LEFT ARM  Final   Special Requests BOTTLES DRAWN AEROBIC AND ANAEROBIC 5CC  Final   Culture NO GROWTH 2 DAYS  Final   Report Status PENDING  Incomplete  Culture, blood (routine x 2) Call MD if unable to obtain prior to antibiotics being given     Status: None (Preliminary result)   Collection Time: 07/27/15  1:50 AM  Result Value Ref Range Status   Specimen Description BLOOD RIGHT ARM  Final   Special Requests BOTTLES DRAWN AEROBIC AND ANAEROBIC 5CC  Final   Culture NO GROWTH 2 DAYS  Final  Report Status PENDING  Incomplete     Studies:  Recent x-ray studies have been reviewed in detail by the Attending Physician  Scheduled Meds:  Scheduled Meds: . allopurinol  300 mg Oral Daily  . amLODipine  5 mg Oral Daily  . antiseptic oral rinse  7 mL Mouth Rinse BID  . aspirin  325 mg Oral Daily  . folic acid  1 mg Oral Daily  . gabapentin  300 mg Oral BID  . heparin subcutaneous  5,000 Units Subcutaneous 3 times per day  . insulin aspart  0-9 Units Subcutaneous TID WC  . levETIRAcetam  500 mg Oral BID  . levofloxacin  500 mg Oral Daily  . metoprolol succinate  25 mg Oral Daily  . mometasone-formoterol  2 puff Inhalation BID  . oxymorphone  40 mg Oral Q12H  . pantoprazole  40 mg Oral Daily    Time spent on care of this patient: 35 mins  Zannie Cove [facility] Office  [phone]  Contact MD directly via text page:      [email or web address]      password [facility]  07/30/2015, 12:56 PM   LOS: 4 days

## 2015-07-30 NOTE — Progress Notes (Signed)
Occupational Therapy Treatment Patient Details Name: Shareece Bultman MRN: [id number] DOB: Jul 04, 1956 Today's Date: 07/30/2015    History of present illness Danyele Smejkal is a 59 y.o. 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   OT comments  Session focused on LB dressing / bathing and provided a hip kit ( medicaid patient). Pt with excellent return demo and encouraged to use AE with RN staff to complete dressing/ bathing while in acute setting. Ot to continue to follow acutely for balance with basic transfer.   Follow Up Recommendations  SNF    Equipment Recommendations  None recommended by OT (defer to SNF)    Recommendations for Other Services      Precautions / Restrictions Precautions Precautions: Fall       Mobility Bed Mobility Overal bed mobility: Modified Independent             General bed mobility comments: completed transfer with HOB 30 degrees  Transfers                      Balance   Sitting-balance support: Feet supported;No upper extremity supported Sitting balance-Leahy Scale: Good                             ADL Overall ADL's : Needs assistance/impaired             Lower Body Bathing: Supervison/ safety;Sit to/from stand       Lower Body Dressing: Supervision/safety;Sit to/from stand                 General ADL Comments: pt provided hip kit and education provided. PT is able to sit supine and reach socks to pull up sock but not to don sock. Pt demonstrated don doff socks with sock aide, don doff pants with reacher, and use of long handle sponge. Pt educated on shoe horn but no shoes to demonstrate      Vision                     Perception     Praxis      Cognition    Behavior During Therapy: [facility] for tasks assessed/performed Overall Cognitive Status: Within Functional Limits for tasks assessed                       Extremity/Trunk Assessment               Exercises     Shoulder Instructions       General Comments      Pertinent Vitals/ Pain       Pain Assessment: No/denies pain  Home Living                                          Prior Functioning/Environment              Frequency Min 2X/week     Progress Toward Goals  OT Goals(current goals can now be found in the care plan section)  Progress towards OT goals: Progressing toward goals  Acute Rehab OT Goals Patient Stated Goal: to  go to rehab then home OT Goal Formulation: With patient Time For Goal Achievement: 08/11/15 Potential to Achieve Goals: Good ADL Goals Pt Will Perform Grooming: with set-up;with supervision;standing Pt Will Perform Lower Body Dressing: with min guard assist;with adaptive equipment;sit to/from stand Pt Will Transfer to Toilet: with min guard assist;ambulating;bedside commode Pt Will Perform Toileting - Clothing Manipulation and hygiene: with min assist;sit to/from stand Additional ADL Goal #1: Pt will be min guard A for in and [place] Discharge plan remains appropriate    Co-evaluation                 End of Session     Activity Tolerance Patient tolerated treatment well   Patient Left in bed;with call bell/phone within reach   Nurse Communication Mobility status;Precautions        Time: 3601-6580 OT Time Calculation (min): 17 min  Charges: OT General Charges $OT Visit: 1 Procedure OT Treatments $Self Care/Home Management : 8-22 mins  Parke Poisson B 07/30/2015, 2:54 PM   Jeri Modena   OTR/L Pager: [phone] Office: [phone] .

## 2015-07-31 ENCOUNTER — Inpatient Hospital Stay (HOSPITAL_COMMUNITY): Payer: Medicaid Other

## 2015-07-31 LAB — CBC
HEMATOCRIT: 31 % — AB (ref 36.0–46.0)
HEMOGLOBIN: 9.9 g/dL — AB (ref 12.0–15.0)
MCH: 25.3 pg — ABNORMAL LOW (ref 26.0–34.0)
MCHC: 31.9 g/dL (ref 30.0–36.0)
MCV: 79.1 fL (ref 78.0–100.0)
Platelets: 359 10*3/uL (ref 150–400)
RBC: 3.92 MIL/uL (ref 3.87–5.11)
RDW: 15.6 % — ABNORMAL HIGH (ref 11.5–15.5)
WBC: 11.1 10*3/uL — AB (ref 4.0–10.5)

## 2015-07-31 LAB — COMPREHENSIVE METABOLIC PANEL
ALBUMIN: 2.5 g/dL — AB (ref 3.5–5.0)
ALK PHOS: 132 U/L — AB (ref 38–126)
ALT: 22 U/L (ref 14–54)
AST: 25 U/L (ref 15–41)
Anion gap: 10 (ref 5–15)
BILIRUBIN TOTAL: 0.6 mg/dL (ref 0.3–1.2)
CALCIUM: 9.3 mg/dL (ref 8.9–10.3)
CO2: 20 mmol/L — AB (ref 22–32)
Chloride: 105 mmol/L (ref 101–111)
Creatinine, Ser: 0.87 mg/dL (ref 0.44–1.00)
GFR calc Af Amer: >60 mL/min (ref >60)
GFR calc non Af Amer: >60 mL/min (ref >60)
GLUCOSE: 132 mg/dL — AB (ref 65–99)
Potassium: 4.3 mmol/L (ref 3.5–5.1)
SODIUM: 135 mmol/L (ref 135–145)
TOTAL PROTEIN: 6.9 g/dL (ref 6.5–8.1)

## 2015-07-31 LAB — CULTURE, BLOOD (ROUTINE X 2)
CULTURE: NO GROWTH
Culture: NO GROWTH

## 2015-07-31 LAB — GLUCOSE, CAPILLARY
GLUCOSE-CAPILLARY: 283 mg/dL — AB (ref 65–99)
GLUCOSE-CAPILLARY: 354 mg/dL — AB (ref 65–99)
Glucose-Capillary: 142 mg/dL — ABNORMAL HIGH (ref 65–99)
Glucose-Capillary: 169 mg/dL — ABNORMAL HIGH (ref 65–99)

## 2015-07-31 MED ORDER — BARIUM SULFATE 2.1 % PO SUSP
ORAL | Status: AC
  Administered 2015-07-31: 1 mL
  Filled 2015-07-31: qty 2

## 2015-07-31 MED ORDER — DIPHENHYDRAMINE HCL 25 MG PO CAPS
50.0000 mg | ORAL_CAPSULE | Freq: Once | ORAL | Status: AC
  Administered 2015-07-31: 50 mg via ORAL
  Filled 2015-07-31: qty 2

## 2015-07-31 MED ORDER — IOHEXOL 300 MG/ML  SOLN
100.0000 mL | Freq: Once | INTRAMUSCULAR | Status: AC | PRN
  Administered 2015-07-31: 100 mL via INTRAVENOUS

## 2015-07-31 MED ORDER — PREDNISONE 50 MG PO TABS
50.0000 mg | ORAL_TABLET | Freq: Four times a day (QID) | ORAL | Status: AC
  Administered 2015-07-31: 50 mg via ORAL
  Filled 2015-07-31: qty 1

## 2015-07-31 MED ORDER — INSULIN ASPART 100 UNIT/ML ~~LOC~~ SOLN
3.0000 [IU] | Freq: Once | SUBCUTANEOUS | Status: AC
  Administered 2015-07-31: 3 [IU] via SUBCUTANEOUS

## 2015-07-31 NOTE — Progress Notes (Signed)
[place] TEAM 1 Transfer 9/8  Amber [name] ZOX:[id number] DOB: 04-24-1956 DOA: 07/26/2015 PCP: [name], MD  Admit HPI / Brief Narrative: 59 y.o. 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 She had a fall 3 days prior following which she started developing chest pain, cough  and worsening shortness of breath. In the ER patient was found to be short of breath and CXR showed possible infiltrates. Patient's d-dimer was elevated but she is allergic to contrast. Patient was empirically started on heparin and antibiotics for possible pneumonia and admitted for further management.   HPI/Subjective: Breathing improved, c/o chronic back pain, c/o mild L sided abd pain  Assessment/Plan:  Acute respiratory failure with hypoxia due to R base > L base HCAP -improving -was on Vancomycin and Zosyn for HCAP, changed to PO levaquin 9/8 -VQ scan negative -weaned off O2 -Blood and sputum cx negative  Chest pain with elevated troponin -troponin peaked at 0.07 most likely secondary to demand ischemia - chest pain pleritic believed to be due to falls/injury, pneumonia -resolved, ECHO with normal EF and wall motion -VQ scan negative  Abdominal pain and tenderness -ongoing since yesterday, moaning in pain this am, mild tenderness in LUQ and RUQ -will check CT Abd, h/o itching with contrast will premedicate with Prednisone and benadryl prior to Contrast -down grade diet to clears pending CT -labs unremarkable  Chronic back pain  -S/P extensive prior back surgeries  -continue home regimen of Opana and Oxycodone PRN as prescribed by her PCP for >3years  Frequent falls  --related to chronic back problems and polypharmacy -Pt following,  SNF recommended, pt declines SNF, will go home with HHPT -avoid escalating narcotic tx beyond home regimen   Iron deficiency anemia  -hb stable -needs colonoscopy as outpatient  HTN -BP currently well controlled   Pulmonary hypertension -PA pressure 69mm Hg as of 06/13/15 TTE  Diabetes mellitus type 2 -CBG currently well controlled   Seizures -Continue Keppra 500 mg BID  Gout  -continue allopurinol 300 mg daily  DVT proph: SQ heparin  Code Status: FULL Family Communication: no family at bedside Disposition Plan:Home when stable, declines SNF  Consultants: NA  Procedure/Significant Events: 9/5 CT head without contrast;-deep white matter hypodensities represent chronic microvascular ischemic changes. -Negative acute changes 9/6 VQ scan negative PE  Antibiotics: Azithromycin 1 dose Ceftriaxone 1 dose Zosyn 9/6 > Vancomycin 9/6 >  Objective: Blood pressure 113/76, pulse 102, temperature 99.1 F (37.3 C), temperature source Oral, resp. rate 20, height 5' (1.524 m), weight 67.4 kg (148 lb 9.4 oz), SpO2 95 %.  Intake/Output Summary (Last 24 hours) at 07/31/15 1058 Last data filed at 07/31/15 0434  Gross per 24 hour  Intake   1200 ml  Output      0 ml  Net   1200 ml   Exam: General: No acute respiratory distress, AAOx3 Lungs: mild bibasilar crackles - no wheeze  Cardiovascular: Regular rate and rhythm without murmur gallop or rub normal S1 and S2 Abdomen: soft, Mild LUQ tenderness,  nondistended, bowel sounds positive, no rebound, no ascites, no appreciable mass Extremities: No significant cyanosis, clubbing, or edema bilateral lower extremities  Data Reviewed: Basic Metabolic Panel:  Recent Labs Lab 07/27/15 0631 07/28/15 0238 07/29/15 8119 07/30/15 0404  07/31/15 0416  NA 133* 137 136 134* 135  K 3.5 3.2* 4.2 4.2 4.3  CL 103 108 109 106 105  CO2 19* 20* 18* 19* 20*  GLUCOSE 339* 137* 117* 132* 132*  BUN 8 9 7  5* <5*  CREATININE 0.95 1.00 0.98 0.90  0.87  CALCIUM 8.4* 8.2* 8.5* 8.8* 9.3  MG  --  1.5* 1.9  --   --    Liver Function Tests:  Recent Labs Lab 07/26/15 1931 07/27/15 0631 07/28/15 0238 07/29/15 0512 07/31/15 0416  AST 31 33 20 32 25  ALT 27 25 19 24 22   ALKPHOS 131* 109 88 122 132*  BILITOT 0.7 0.4 0.5 0.7 0.6  PROT 7.7 6.2* 5.6* 6.0* 6.9  ALBUMIN 2.8* 2.2* 2.1* 2.2* 2.5*   CBC:  Recent Labs Lab 07/27/15 0631 07/28/15 0238 07/29/15 0512 07/30/15 0404 07/31/15 0416  WBC 9.3 10.5 9.8 11.3* 11.1*  NEUTROABS 7.4 7.6  --   --   --   HGB 9.4* 8.6* 9.1* 9.1* 9.9*  HCT 29.1* 26.6* 28.8* 28.4* 31.0*  MCV 77.4* 77.6* 78.3 79.3 79.1  PLT 285 275 297 297 359   Cardiac Enzymes:  Recent Labs Lab 07/26/15 1931 07/27/15 0145 07/27/15 0631 07/27/15 1230  TROPONINI 0.04* 0.07* 0.05* 0.05*   BNP (last 3 results)  Recent Labs  06/11/15 2255 07/26/15 1931 07/27/15 0150  BNP 51.1 37.8 41.3   CBG:  Recent Labs Lab 07/30/15 0740 07/30/15 1216 07/30/15 1646 07/30/15 2120 07/31/15 0749  GLUCAP 200* 131* 142* 145* 142*    Recent Results (from the past 240 hour(s))  Blood culture (routine x 2)     Status: None   Collection Time: 07/26/15  8:01 PM  Result Value Ref Range Status   Specimen Description BLOOD LEFT ANTECUBITAL  Final   Special Requests BOTTLES DRAWN AEROBIC AND ANAEROBIC  5CC EACH  Final   Culture   Final    NO GROWTH 5 DAYS Performed at [facility]    Report Status 07/31/2015 FINAL  Final  Blood culture (routine x 2)     Status: None   Collection Time: 07/26/15  8:15 PM  Result Value Ref Range Status   Specimen Description BLOOD RIGHT WRIST  Final   Special Requests BOTTLES DRAWN AEROBIC AND ANAEROBIC  5CC EACH  Final   Culture   Final    NO GROWTH 5 DAYS Performed at [facility]    Report Status 07/31/2015 FINAL  Final  MRSA PCR Screening     Status: None   Collection Time: 07/26/15 11:17 PM  Result Value Ref Range Status   MRSA by PCR NEGATIVE NEGATIVE Final     Comment:        The GeneXpert MRSA Assay (FDA approved for NASAL specimens only), is one component of a comprehensive MRSA colonization surveillance program. It is not intended to diagnose MRSA infection nor to guide or monitor treatment for MRSA infections.   Culture, blood (routine x 2) Call MD if unable to obtain prior to antibiotics being given     Status: None (Preliminary result)   Collection Time: 07/27/15  1:45 AM  Result Value Ref Range Status   Specimen Description BLOOD LEFT ARM  Final   Special Requests BOTTLES DRAWN AEROBIC AND ANAEROBIC 5CC  Final   Culture NO GROWTH 4 DAYS  Final   Report Status PENDING  Incomplete  Culture, blood (routine x 2) Call MD if unable to obtain prior to antibiotics being  given     Status: None (Preliminary result)   Collection Time: 07/27/15  1:50 AM  Result Value Ref Range Status   Specimen Description BLOOD RIGHT ARM  Final   Special Requests BOTTLES DRAWN AEROBIC AND ANAEROBIC 5CC  Final   Culture NO GROWTH 4 DAYS  Final   Report Status PENDING  Incomplete     Studies:  Recent x-ray studies have been reviewed in detail by the Attending Physician  Scheduled Meds:  Scheduled Meds: . allopurinol  300 mg Oral Daily  . amLODipine  5 mg Oral Daily  . aspirin  325 mg Oral Daily  . diphenhydrAMINE  50 mg Oral Once  . folic acid  1 mg Oral Daily  . gabapentin  300 mg Oral BID  . heparin subcutaneous  5,000 Units Subcutaneous 3 times per day  . insulin aspart  0-9 Units Subcutaneous TID WC  . levETIRAcetam  500 mg Oral BID  . levofloxacin  500 mg Oral Daily  . metoprolol succinate  25 mg Oral Daily  . mometasone-formoterol  2 puff Inhalation BID  . oxymorphone  40 mg Oral Q12H  . pantoprazole  40 mg Oral Daily  . predniSONE  50 mg Oral Q6H    Time spent on care of this patient: 35 mins  Zannie Cove [facility] Office  [phone]  Contact MD directly via text page:      [email or web address]      password  [facility]  07/31/2015, 10:58 AM   LOS: 5 days

## 2015-08-01 LAB — CULTURE, BLOOD (ROUTINE X 2)
Culture: NO GROWTH
Culture: NO GROWTH

## 2015-08-01 LAB — COMPREHENSIVE METABOLIC PANEL
ALBUMIN: 2.5 g/dL — AB (ref 3.5–5.0)
ALK PHOS: 120 U/L (ref 38–126)
ALT: 18 U/L (ref 14–54)
AST: 23 U/L (ref 15–41)
Anion gap: 11 (ref 5–15)
BUN: <5 mg/dL — ABNORMAL LOW (ref 6–20)
CALCIUM: 9.5 mg/dL (ref 8.9–10.3)
CHLORIDE: 103 mmol/L (ref 101–111)
CO2: 20 mmol/L — AB (ref 22–32)
CREATININE: 0.92 mg/dL (ref 0.44–1.00)
GFR calc Af Amer: >60 mL/min (ref >60)
GFR calc non Af Amer: >60 mL/min (ref >60)
GLUCOSE: 166 mg/dL — AB (ref 65–99)
Potassium: 4.6 mmol/L (ref 3.5–5.1)
SODIUM: 134 mmol/L — AB (ref 135–145)
Total Bilirubin: 0.4 mg/dL (ref 0.3–1.2)
Total Protein: 7.1 g/dL (ref 6.5–8.1)

## 2015-08-01 LAB — CBC
HCT: 28.5 % — ABNORMAL LOW (ref 36.0–46.0)
HEMOGLOBIN: 9.1 g/dL — AB (ref 12.0–15.0)
MCH: 25.1 pg — ABNORMAL LOW (ref 26.0–34.0)
MCHC: 31.9 g/dL (ref 30.0–36.0)
MCV: 78.7 fL (ref 78.0–100.0)
PLATELETS: 386 10*3/uL (ref 150–400)
RBC: 3.62 MIL/uL — AB (ref 3.87–5.11)
RDW: 15.3 % (ref 11.5–15.5)
WBC: 8.7 10*3/uL (ref 4.0–10.5)

## 2015-08-01 LAB — GLUCOSE, CAPILLARY
GLUCOSE-CAPILLARY: 145 mg/dL — AB (ref 65–99)
Glucose-Capillary: 166 mg/dL — ABNORMAL HIGH (ref 65–99)
Glucose-Capillary: 168 mg/dL — ABNORMAL HIGH (ref 65–99)
Glucose-Capillary: 95 mg/dL (ref 65–99)

## 2015-08-01 LAB — LIPASE, BLOOD: Lipase: 16 U/L — ABNORMAL LOW (ref 22–51)

## 2015-08-01 MED ORDER — DIPHENHYDRAMINE HCL 25 MG PO CAPS
25.0000 mg | ORAL_CAPSULE | Freq: Four times a day (QID) | ORAL | Status: DC | PRN
  Administered 2015-08-01 (×3): 25 mg via ORAL
  Filled 2015-08-01 (×3): qty 1

## 2015-08-01 MED ORDER — LORAZEPAM 1 MG PO TABS
1.0000 mg | ORAL_TABLET | Freq: Once | ORAL | Status: AC
  Administered 2015-08-01: 1 mg via ORAL
  Filled 2015-08-01: qty 1

## 2015-08-01 NOTE — Progress Notes (Signed)
[name] TEAM 1 Transfer 9/8  Amber [name] TMH:[id number] DOB: Apr 14, 1956 DOA: 07/26/2015 PCP: [name], MD  Admit HPI / Brief Narrative: 59 y.o. 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 She had a fall 3 days prior following which she started developing chest pain, cough  and worsening shortness of breath. In the ER patient was found to be short of breath and CXR showed possible infiltrates. Patient's d-dimer was elevated but she is allergic to contrast. Patient was empirically started on heparin and antibiotics for possible pneumonia and admitted for further management.   HPI/Subjective: abd pain but better, c/o back pain and wondering if shes still getting her usual amount of pain meds here  Assessment/Plan:  Acute respiratory failure with hypoxia due to R base > L base HCAP -improving, had been Vancomycin and Zosyn for HCAP, changed to PO levaquin 9/8 -VQ scan negative -weaned off O2 -Blood and sputum cx negative  Chest pain with elevated troponin -resolved -troponin peaked at 0.07 most likely secondary to demand ischemia - chest pain pleritic believed to be due to falls/injury, pneumonia -resolved, ECHO with normal EF and wall motion -VQ scan negative  Back and Abdominal pain  -Ct unremarkable -suspect related to her back pain, radiating to sides -labs unremarkable -now back to baseline chronic back pain  Chronic back pain  -S/P extensive prior back surgeries  -continue home regimen of Opana and Oxycodone PRN as prescribed by her PCP for >3years  Frequent falls  -related to chronic back problems and polypharmacy -Pt following, SNF recommended, pt declines SNF, will go home with HHPT -avoid escalating  narcotic tx beyond home regimen   Iron deficiency anemia  -hb stable -needs colonoscopy as outpatient  HTN -BP currently well controlled   Pulmonary hypertension -PA pressure 33mm Hg as of 06/13/15 TTE  Diabetes mellitus type 2 -CBG currently well controlled   Seizures -Continue Keppra 500 mg BID  Gout  -continue allopurinol 300 mg daily  DVT proph: SQ heparin  Code Status: FULL Family Communication: no family at bedside Disposition Plan:Home vs SNF tomorrow  Consultants:NA  Procedure/Significant Events: 9/5 CT head without contrast;-deep white matter hypodensities represent chronic microvascular ischemic changes. -Negative acute changes 9/6 VQ scan negative PE  Antibiotics: Azithromycin 1 dose Ceftriaxone 1 dose Zosyn 9/6 > Vancomycin 9/6 >  Objective: Blood pressure 114/73, pulse 89, temperature 98.6 F (37 C), temperature source Oral, resp. rate 18, height 5' (1.524 m), weight 66 kg (145 lb 8.1 oz), SpO2 100 %.  Intake/Output Summary (Last 24 hours) at 08/01/15 1515 Last data filed at 07/31/15 1818  Gross per 24 hour  Intake    440 ml  Output      0 ml  Net    440 ml   Exam: General: No acute respiratory distress, AAOx3 Lungs: mild bibasilar crackles - no wheeze  Cardiovascular: Regular rate and rhythm without murmur gallop or rub normal S1 and S2 Abdomen: soft, Mild LUQ tenderness,  nondistended, bowel sounds positive, no rebound, no ascites, no appreciable mass Extremities: No significant cyanosis, clubbing, or edema bilateral lower extremities  Data Reviewed: Basic Metabolic Panel:  Recent Labs Lab 07/28/15 0238 07/29/15 0512 07/30/15 0404 07/31/15 0416 08/01/15 0346  NA 137 136 134* 135 134*  K 3.2* 4.2 4.2 4.3 4.6  CL 108 109 106 105 103  CO2 20* 18* 19* 20* 20*  GLUCOSE 137* 117* 132* 132* 166*  BUN 9 7 5* <5* <5*  CREATININE 1.00 0.98 0.90 0.87 0.92  CALCIUM 8.2* 8.5* 8.8* 9.3 9.5  MG 1.5* 1.9  --   --   --    Liver Function  Tests:  Recent Labs Lab 07/27/15 0631 07/28/15 0238 07/29/15 0512 07/31/15 0416 08/01/15 0346  AST 33 20 32 25 23  ALT 25 19 24 22 18   ALKPHOS 109 88 122 132* 120  BILITOT 0.4 0.5 0.7 0.6 0.4  PROT 6.2* 5.6* 6.0* 6.9 7.1  ALBUMIN 2.2* 2.1* 2.2* 2.5* 2.5*   CBC:  Recent Labs Lab 07/27/15 0631 07/28/15 0238 07/29/15 0512 07/30/15 0404 07/31/15 0416 08/01/15 0346  WBC 9.3 10.5 9.8 11.3* 11.1* 8.7  NEUTROABS 7.4 7.6  --   --   --   --   HGB 9.4* 8.6* 9.1* 9.1* 9.9* 9.1*  HCT 29.1* 26.6* 28.8* 28.4* 31.0* 28.5*  MCV 77.4* 77.6* 78.3 79.3 79.1 78.7  PLT 285 275 297 297 359 386   Cardiac Enzymes:  Recent Labs Lab 07/26/15 1931 07/27/15 0145 07/27/15 0631 07/27/15 1230  TROPONINI 0.04* 0.07* 0.05* 0.05*   BNP (last 3 results)  Recent Labs  06/11/15 2255 07/26/15 1931 07/27/15 0150  BNP 51.1 37.8 41.3   CBG:  Recent Labs Lab 07/31/15 1158 07/31/15 1715 07/31/15 2226 08/01/15 0752 08/01/15 1210  GLUCAP 169* 283* 354* 145* 166*    Recent Results (from the past 240 hour(s))  Blood culture (routine x 2)     Status: None   Collection Time: 07/26/15  8:01 PM  Result Value Ref Range Status   Specimen Description BLOOD LEFT ANTECUBITAL  Final   Special Requests BOTTLES DRAWN AEROBIC AND ANAEROBIC  5CC EACH  Final   Culture   Final    NO GROWTH 5 DAYS Performed at [facility]    Report Status 07/31/2015 FINAL  Final  Blood culture (routine x 2)     Status: None   Collection Time: 07/26/15  8:15 PM  Result Value Ref Range Status   Specimen Description BLOOD RIGHT WRIST  Final   Special Requests BOTTLES DRAWN AEROBIC AND ANAEROBIC  5CC EACH  Final   Culture   Final    NO GROWTH 5 DAYS Performed at [facility]    Report Status 07/31/2015 FINAL  Final  MRSA PCR Screening     Status: None   Collection Time: 07/26/15 11:17 PM  Result Value Ref Range Status   MRSA by PCR NEGATIVE NEGATIVE Final    Comment:        The GeneXpert MRSA  Assay (FDA approved for NASAL specimens only), is one component of a comprehensive MRSA colonization surveillance program. It is not intended to diagnose MRSA infection nor to guide or monitor treatment for MRSA infections.   Culture, blood (routine x 2) Call MD if unable to obtain prior to antibiotics being given     Status: None (Preliminary result)   Collection Time: 07/27/15  1:45 AM  Result Value Ref Range Status   Specimen Description BLOOD LEFT ARM  Final   Special Requests BOTTLES DRAWN AEROBIC AND ANAEROBIC 5CC  Final   Culture NO GROWTH 4 DAYS  Final   Report Status PENDING  Incomplete  Culture, blood (routine x 2) Call MD if unable to obtain prior to antibiotics being  given     Status: None (Preliminary result)   Collection Time: 07/27/15  1:50 AM  Result Value Ref Range Status   Specimen Description BLOOD RIGHT ARM  Final   Special Requests BOTTLES DRAWN AEROBIC AND ANAEROBIC 5CC  Final   Culture NO GROWTH 4 DAYS  Final   Report Status PENDING  Incomplete     Studies:  Scheduled Meds:  Scheduled Meds: . allopurinol  300 mg Oral Daily  . amLODipine  5 mg Oral Daily  . aspirin  325 mg Oral Daily  . folic acid  1 mg Oral Daily  . gabapentin  300 mg Oral BID  . heparin subcutaneous  5,000 Units Subcutaneous 3 times per day  . insulin aspart  0-9 Units Subcutaneous TID WC  . levETIRAcetam  500 mg Oral BID  . levofloxacin  500 mg Oral Daily  . metoprolol succinate  25 mg Oral Daily  . mometasone-formoterol  2 puff Inhalation BID  . oxymorphone  40 mg Oral Q12H  . pantoprazole  40 mg Oral Daily    Time spent on care of this patient: 35 mins  Zannie Cove [facility] Office  [phone]  Contact MD directly via text page:      [email or web address]      password [facility]  08/01/2015, 3:15 PM   LOS: 6 days

## 2015-08-02 DIAGNOSIS — J9600 Acute respiratory failure, unspecified whether with hypoxia or hypercapnia: Secondary | ICD-10-CM

## 2015-08-02 DIAGNOSIS — J189 Pneumonia, unspecified organism: Secondary | ICD-10-CM | POA: Insufficient documentation

## 2015-08-02 LAB — BASIC METABOLIC PANEL
ANION GAP: 8 (ref 5–15)
CALCIUM: 9.1 mg/dL (ref 8.9–10.3)
CO2: 22 mmol/L (ref 22–32)
Chloride: 107 mmol/L (ref 101–111)
Creatinine, Ser: 0.86 mg/dL (ref 0.44–1.00)
GFR calc Af Amer: >60 mL/min (ref >60)
GLUCOSE: 107 mg/dL — AB (ref 65–99)
Potassium: 4.1 mmol/L (ref 3.5–5.1)
Sodium: 137 mmol/L (ref 135–145)

## 2015-08-02 LAB — CBC
HCT: 26.7 % — ABNORMAL LOW (ref 36.0–46.0)
Hemoglobin: 8.3 g/dL — ABNORMAL LOW (ref 12.0–15.0)
MCH: 24.3 pg — ABNORMAL LOW (ref 26.0–34.0)
MCHC: 31.1 g/dL (ref 30.0–36.0)
MCV: 78.1 fL (ref 78.0–100.0)
PLATELETS: 382 10*3/uL (ref 150–400)
RBC: 3.42 MIL/uL — ABNORMAL LOW (ref 3.87–5.11)
RDW: 15.2 % (ref 11.5–15.5)
WBC: 8.5 10*3/uL (ref 4.0–10.5)

## 2015-08-02 LAB — GLUCOSE, CAPILLARY
GLUCOSE-CAPILLARY: 240 mg/dL — AB (ref 65–99)
GLUCOSE-CAPILLARY: 83 mg/dL (ref 65–99)

## 2015-08-02 MED ORDER — DIAZEPAM 5 MG PO TABS: 5.0000 mg | ORAL_TABLET | Freq: Two times a day (BID) | ORAL | Status: DC | PRN

## 2015-08-02 MED ORDER — LEVOFLOXACIN 500 MG PO TABS: 500.0000 mg | ORAL_TABLET | Freq: Every day | ORAL | Status: DC

## 2015-08-02 NOTE — Progress Notes (Signed)
Physical Therapy Treatment Patient Details Name: Amber [name] MRN: [id number] DOB: 1956/10/14 Today's Date: 08/02/2015    History of Present Illness Amber [name] is a 59 y.o. 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    PT Comments    Patient able to increase her ambulation today, 125 feet with min guard. Patient reporting that she is planning to go home upon D/C with family assistance 24/7. Denies any questions or concerns after session.   Follow Up Recommendations  Home health PT;Supervision/Assistance - 24 hour     Equipment Recommendations  Other (comment) (reports having rollator walker)    Recommendations for Other Services       Precautions / Restrictions Precautions Precautions: Fall Restrictions Weight Bearing Restrictions: No    Mobility  Bed Mobility Overal bed mobility: Needs Assistance Bed Mobility: Supine to Sit     Supine to sit: Supervision        Transfers Overall transfer level: Needs assistance Equipment used: Rolling walker (2 wheeled) Transfers: Sit to/from Stand Sit to Stand: Min guard         General transfer comment: Pt relies heavily on UE strength when performing sit to stand transfer from [facility] and chair  Ambulation/Gait Ambulation/Gait assistance: Min guard Ambulation Distance (Feet): 125 Feet Assistive device: Rolling walker (2 wheeled) Gait Pattern/deviations: Step-through pattern Gait velocity: decreased   General Gait Details: cues for not leaning on rollator armrests while ambulating.    Stairs            Wheelchair Mobility    Modified Rankin (Stroke Patients Only)       Balance Overall balance assessment: Needs assistance Sitting-balance support: No upper extremity  supported Sitting balance-Leahy Scale: Good     Standing balance support: Bilateral upper extremity supported Standing balance-Leahy Scale: Poor Standing balance comment: rollator walker                    Cognition Arousal/Alertness: Awake/alert Behavior During Therapy: WFL for tasks assessed/performed Overall Cognitive Status: Within Functional Limits for tasks assessed                      Exercises      General Comments        Pertinent Vitals/Pain Pain Assessment: 0-10 Pain Score: 10-Worst pain ever Pain Location: back and Rt knee Pain Descriptors / Indicators: Aching Pain Intervention(s): Monitored during session;Limited activity within patient's tolerance    Home Living                      Prior Function            PT Goals (current goals can now be found in the care plan section) Acute Rehab PT Goals Patient Stated Goal: go home PT Goal Formulation: With patient Time For Goal Achievement: 08/11/15 Potential to Achieve Goals: Good Progress towards PT goals: Progressing toward goals    Frequency  Min 3X/week    PT Plan Current plan remains appropriate    Co-evaluation             End of Session Equipment Utilized During Treatment: Gait belt Activity Tolerance: No increased pain;Patient limited by fatigue Patient left: in chair;with call bell/phone within reach     Time: 1016-1031 PT  Time Calculation (min) (ACUTE ONLY): 15 min  Charges:  $Gait Training: 8-22 mins                    G Codes:      Christiane Ha, PT, CSCS Pager [phone] Office [phone]  08/02/2015, 1:07 PM

## 2015-08-02 NOTE — Care Management (Signed)
Discussed discharge planning with patient . Patient stated she did not want to go anywhere for short term rehab. Explained she does not have an qualifying DX for Medicaid to pay for home health PT , but she could have home home RN . Patient declined home health .   Ronny Flurry RN BSN [phone]

## 2015-08-02 NOTE — Progress Notes (Signed)
AVS discharge instructions were reviewed with patient. Patient was given prescription for valium and levaquin to take to her pharmacy. Patient  and her granddaughter asked about patient getting a glucose meter because she does not have one at home. Nurse asked about how to go about help patient to get a glucose meter, but before nurse could page doctor about this, patient and her granddaughter left. Nurse was told by secretary that patient had been assisted out by wheelchair. Nurse went to the second to see if patient was there but patient was already gone.

## 2015-08-02 NOTE — Discharge Summary (Signed)
Physician Discharge Summary  Amber [name] QAS:[id number] DOB: 1956-11-15 DOA: 07/26/2015  PCP: [name], MD  Admit date: 07/26/2015 Discharge date: 08/02/2015  Time spent: 45 minutes  Recommendations for Outpatient Follow-up:  1. PCP in 1 week, i have not prescribed her long term narcotics she is advised to contact her PCPs office for this Declined SNF for rehab  Discharge Diagnoses:    Health care associated pneumonia   History of seizures   Acute respiratory failure with hypoxia   Acute respiratory failure   Diabetes mellitus type 2, controlled   Chest pain   HCAP (healthcare-associated pneumonia)   Chronic back pain   Frequent falls   Essential hypertension   Pulmonary hypertension   Type 2 diabetes mellitus with complication   Seizures   Gout   CAP (community acquired pneumonia)   Discharge Condition: stable  Diet recommendation:diabteic, low sodium  Filed Weights   07/31/15 0300 08/01/15 0700 08/02/15 0606  Weight: 67.4 kg (148 lb 9.4 oz) 66 kg (145 lb 8.1 oz) 66.3 kg (146 lb 2.6 oz)    History of present illness:  59 y.o. 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 She had a fall 3 days prior following which she started developing chest pain, cough and worsening shortness of breath. In the ER patient was found to be short of breath and CXR showed possible infiltrates  Hospital Course:  Acute respiratory failure with hypoxia due to R base > L base HCAP -improved, had been Vancomycin and Zosyn for HCAP, changed to PO levaquin 9/8 -VQ scan negative -weaned off O2 -Blood and sputum cx negative -weaned off O2, WBC normalized  Chest pain with elevated troponin -resolved -troponin peaked at 0.07  most likely secondary to demand ischemia - chest pain pleritic believed to be due to falls/injury, pneumonia -resolved, ECHO with normal EF and wall motion -VQ scan negative  Back and Abdominal pain  -Ct unremarkable -suspect related to her chronic back pain, radiating to sides -labs unremarkable -now back to baseline chronic back pain  Chronic back pain  -S/P extensive prior back surgeries  -continue home regimen of Opana and Oxycodone PRN as prescribed by her PCP for >3years -i refused to give her a prescription for her home narotics and advised her to get them from her PCP/1 prescriber only  Frequent falls  -related to chronic back problems and polypharmacy -Pt following, SNF recommended, pt declines SNF, HHPT not an option due to insurance -avoid escalating narcotic tx beyond home regimen   Iron deficiency anemia  -hb stable -needs colonoscopy as outpatient  HTN -BP currently well controlled   Pulmonary hypertension -PA pressure 70mm Hg as of 06/13/15 TTE  Diabetes mellitus type 2 -CBG currently well controlled   Seizures -Continue Keppra 500 mg BID  Gout  -continue allopurinol 300 mg daily   Discharge Exam: Filed Vitals:   08/02/15 0606  BP: 119/81  Pulse: 99  Temp: 98.6 F (37 C)  Resp: 18    General: AAOx3 Cardiovascular: S1S2/RRR Respiratory: CTAB  Discharge Instructions   Discharge Instructions    Diet - low sodium heart healthy    Complete by:  As directed      Diet Carb Modified    Complete by:  As directed  Increase activity slowly    Complete by:  As directed           Current Discharge Medication List    START taking these medications   Details  levofloxacin (LEVAQUIN) 500 MG tablet Take 1 tablet (500 mg total) by mouth daily. For 3 days Qty: 3 tablet, Refills: 0      CONTINUE these medications which have CHANGED   Details  diazepam (VALIUM) 5 MG tablet Take 1 tablet (5 mg total) by mouth every 12 (twelve) hours as needed  for muscle spasms. Qty: 14 tablet, Refills: 0      CONTINUE these medications which have NOT CHANGED   Details  albuterol (PROVENTIL HFA;VENTOLIN HFA) 108 (90 BASE) MCG/ACT inhaler Inhale 2 puffs into the lungs every 6 (six) hours as needed for wheezing or shortness of breath.    allopurinol (ZYLOPRIM) 300 MG tablet Take 300 mg by mouth daily.    amLODipine (NORVASC) 5 MG tablet Take 5 mg by mouth daily.    aspirin EC 81 MG tablet Take 1 tablet (81 mg total) by mouth daily. Qty: 30 tablet, Refills: 2    esomeprazole (NEXIUM) 40 MG capsule Take 40 mg by mouth daily at 12 noon.    Fluticasone-Salmeterol (ADVAIR) 250-50 MCG/DOSE AEPB Inhale 1 puff into the lungs 2 (two) times daily.    folic acid (FOLVITE) 1 MG tablet Take 1 mg by mouth daily.    furosemide (LASIX) 40 MG tablet Take 1 tablet (40 mg total) by mouth daily. Qty: 30 tablet, Refills: 1    gabapentin (NEURONTIN) 300 MG capsule Take 1 capsule (300 mg total) by mouth 2 (two) times daily. Qty: 60 capsule, Refills: 0    levETIRAcetam (KEPPRA) 500 MG tablet Take 1 tablet (500 mg total) by mouth 2 (two) times daily. Qty: 60 tablet, Refills: 0    metFORMIN (GLUCOPHAGE-XR) 500 MG 24 hr tablet Take 500 mg by mouth daily with breakfast.    metoprolol succinate (TOPROL-XL) 25 MG 24 hr tablet Take 25 mg by mouth daily.    Oxycodone HCl 20 MG TABS Take 20 mg by mouth every 4 (four) hours.    oxymorphone (OPANA ER) 40 MG 12 hr tablet Take 40 mg by mouth every 12 (twelve) hours.    polyethylene glycol (MIRALAX / GLYCOLAX) packet Take 17 g by mouth 2 (two) times daily. Qty: 14 each, Refills: 0    potassium chloride SA (K-DUR,KLOR-CON) 20 MEQ tablet Take 2 tablets (40 mEq total) by mouth daily. Qty: 30 tablet, Refills: 0    Vitamin D, Ergocalciferol, (DRISDOL) 50000 UNITS CAPS capsule Take 1 capsule (50,000 Units total) by mouth every 7 (seven) days. Sunday Qty: 30 capsule, Refills: 0    VITAMIN E PO Take 1 capsule by mouth  daily.       Allergies  Allergen Reactions  . Hydromorphone   . Ivp Dye [Iodinated Diagnostic Agents] Itching   Follow-up Information    Follow up with SUN,YUN, MD In 1 week.   Specialty:  Internal Medicine   Contact information:   [address] [place] [address] [phone]        The results of significant diagnostics from this hospitalization (including imaging, microbiology, ancillary and laboratory) are listed below for reference.    Significant Diagnostic Studies: Ct Head Wo Contrast  07/26/2015   CLINICAL DATA:  59 year old female with fall  EXAM: CT HEAD WITHOUT CONTRAST  TECHNIQUE: Contiguous axial images were obtained from the base of the  skull through the vertex without intravenous contrast.  COMPARISON:  None.  FINDINGS: The ventricles and sulci are appropriate in size for the patient's age. Mild periventricular and deep white matter hypodensities represent chronic microvascular ischemic changes. There is no intracranial hemorrhage. No mass effect or midline shift identified.  The visualized paranasal sinuses and mastoid air cells are well aerated. The calvarium is intact.  IMPRESSION: No acute intracranial pathology.  Mild chronic microvascular ischemic disease.   Electronically Signed   By: Elgie Collard M.D.   On: 07/26/2015 22:20   Ct Abdomen Pelvis W Contrast  07/31/2015   CLINICAL DATA:  Abdominal and epigastric pain.  EXAM: CT ABDOMEN AND PELVIS WITH CONTRAST  TECHNIQUE: Multidetector CT imaging of the abdomen and pelvis was performed using the standard protocol following bolus administration of intravenous contrast.  CONTRAST:  OMNIPAQUE IOHEXOL 300 MG/ML  SOLN  COMPARISON:  06/03/2014  FINDINGS: Lower chest and abdominal wall: There reticular opacities in the right more than left lungs, correlating with chart history of pneumonia. No indication of progression.  Hepatobiliary: No focal liver abnormality.Cholecystectomy. No new bile duct enlargement or  calcified stone. There is chronic focal segmental dilatation of lateral left liver ducts without progression.  Pancreas: Chronic enlargement of the distal main duct to 5 mm, with chronic diffuse pancreatic parenchymal atrophy. Calcifications present within the tail. Pancreatic stent seen on the previous study is no longer present. There is no evidence of active inflammation or progressive ductal distention. No gross mass lesion.  Spleen: Negative  Adrenals/Urinary Tract: Negative adrenals. No hydronephrosis or stone. Unremarkable bladder.  Reproductive:No pathologic findings.  Stomach/Bowel: No obstruction. No appendicitis. Distal colonic and duodenal diverticulosis.  Vascular/Lymphatic: No acute vascular abnormality. No mass or adenopathy.  Peritoneal: No ascites or pneumoperitoneum.  Musculoskeletal: No acute abnormalities. Remote T12 inferior endplate fracture. Stable lower lumbar facet arthropathy with grade 1 L4-5 anterolisthesis. Visualized portions of thoracic posterior fixation are unremarkable.  IMPRESSION: 1. No explanation for acute abdominal pain. 2. Prior pancreatitis without active inflammation. Stable biliary and pancreatic ductal enlargement compared to 2015. 3. Known bilateral pneumonia.   Electronically Signed   By: Marnee Spring M.D.   On: 07/31/2015 15:20   Nm Pulmonary Perf And Vent  07/27/2015   CLINICAL DATA:  Chest pain and shortness of breath with elevated D-dimer  EXAM: NUCLEAR MEDICINE VENTILATION - PERFUSION LUNG SCAN  TECHNIQUE: Ventilation images were obtained in multiple projections using inhaled aerosol Tc-48m DTPA. Perfusion images were obtained in multiple projections after intravenous injection of Tc-49m MAA.  RADIOPHARMACEUTICALS:  41 mCi Technetium-41m DTPA aerosol inhalation and 6.2 mCi Technetium-62m MAA IV  COMPARISON:  06/12/2015  FINDINGS: Ventilation: Poor uptake is noted within both lungs although worse on the right than the left. This is likely related to the airspace  disease seen on recent chest x-ray.  Perfusion: No wedge shaped peripheral perfusion defects to suggest acute pulmonary embolism. The appearance of the perfusion imaging is stable from the previous exam.  IMPRESSION: No perfusion defect to suggest pulmonary embolism.  Decreased ventilation within the right lung consistent with diffuse infiltrates seen on recent examination.   Electronically Signed   By: Alcide Clever M.D.   On: 07/27/2015 17:11   Dg Chest Port 1 View  07/29/2015   CLINICAL DATA:  Pneumonia.  Shortness of breath.  EXAM: PORTABLE CHEST - 1 VIEW  COMPARISON:  07/26/2015  FINDINGS: Shallow inspiration. Heart size and pulmonary vascularity are likely normal for inspiratory effort. Patchy infiltrative changes in  both lungs, greater on the right. This could represent pneumonia or edema. Old right rib fractures. Postoperative changes in the thoracic spine.  IMPRESSION: Bilateral patchy airspace disease, greater on the right. No significant change since prior study peer   Electronically Signed   By: Burman Nieves M.D.   On: 07/29/2015 04:32   Dg Chest Portable 1 View  07/26/2015   CLINICAL DATA:  Short of breath.  Recent fall.  Chest pain  EXAM: PORTABLE CHEST - 1 VIEW  COMPARISON:  06/11/2015  FINDINGS: Cardiac enlargement. Right upper lobe and right lower lobe airspace disease may represent pneumonia or asymmetric edema. Small left pleural effusion. Mild left lower lobe airspace disease, probably atelectasis.  Thoracic fusion with hardware unchanged.  IMPRESSION: Asymmetric airspace disease right greater than left. This may represent pneumonia. Edema also possible. There appears to be underlying chronic lung disease.   Electronically Signed   By: Marlan Palau M.D.   On: 07/26/2015 20:21    Microbiology: Recent Results (from the past 240 hour(s))  Blood culture (routine x 2)     Status: None   Collection Time: 07/26/15  8:01 PM  Result Value Ref Range Status   Specimen Description BLOOD LEFT  ANTECUBITAL  Final   Special Requests BOTTLES DRAWN AEROBIC AND ANAEROBIC  5CC EACH  Final   Culture   Final    NO GROWTH 5 DAYS Performed at [facility]    Report Status 07/31/2015 FINAL  Final  Blood culture (routine x 2)     Status: None   Collection Time: 07/26/15  8:15 PM  Result Value Ref Range Status   Specimen Description BLOOD RIGHT WRIST  Final   Special Requests BOTTLES DRAWN AEROBIC AND ANAEROBIC  5CC EACH  Final   Culture   Final    NO GROWTH 5 DAYS Performed at [facility]    Report Status 07/31/2015 FINAL  Final  MRSA PCR Screening     Status: None   Collection Time: 07/26/15 11:17 PM  Result Value Ref Range Status   MRSA by PCR NEGATIVE NEGATIVE Final    Comment:        The GeneXpert MRSA Assay (FDA approved for NASAL specimens only), is one component of a comprehensive MRSA colonization surveillance program. It is not intended to diagnose MRSA infection nor to guide or monitor treatment for MRSA infections.   Culture, blood (routine x 2) Call MD if unable to obtain prior to antibiotics being given     Status: None   Collection Time: 07/27/15  1:45 AM  Result Value Ref Range Status   Specimen Description BLOOD LEFT ARM  Final   Special Requests BOTTLES DRAWN AEROBIC AND ANAEROBIC 5CC  Final   Culture NO GROWTH 5 DAYS  Final   Report Status 08/01/2015 FINAL  Final  Culture, blood (routine x 2) Call MD if unable to obtain prior to antibiotics being given     Status: None   Collection Time: 07/27/15  1:50 AM  Result Value Ref Range Status   Specimen Description BLOOD RIGHT ARM  Final   Special Requests BOTTLES DRAWN AEROBIC AND ANAEROBIC 5CC  Final   Culture NO GROWTH 5 DAYS  Final   Report Status 08/01/2015 FINAL  Final     Labs: Basic Metabolic Panel:  Recent Labs Lab 07/28/15 0238 07/29/15 0512 07/30/15 0404 07/31/15 0416 08/01/15 0346 08/02/15 0403  NA 137 136 134* 135 134* 137  K 3.2* 4.2 4.2 4.3 4.6 4.1  CL 108 109 106  105 103 107  CO2 20* 18* 19* 20* 20* 22  GLUCOSE 137* 117* 132* 132* 166* 107*  BUN 9 7 5* <5* <5* <5*  CREATININE 1.00 0.98 0.90 0.87 0.92 0.86  CALCIUM 8.2* 8.5* 8.8* 9.3 9.5 9.1  MG 1.5* 1.9  --   --   --   --    Liver Function Tests:  Recent Labs Lab 07/27/15 0631 07/28/15 0238 07/29/15 0512 07/31/15 0416 08/01/15 0346  AST 33 20 32 25 23  ALT 25 19 24 22 18   ALKPHOS 109 88 122 132* 120  BILITOT 0.4 0.5 0.7 0.6 0.4  PROT 6.2* 5.6* 6.0* 6.9 7.1  ALBUMIN 2.2* 2.1* 2.2* 2.5* 2.5*    Recent Labs Lab 08/01/15 0346  LIPASE 16*   No results for input(s): AMMONIA in the last 168 hours. CBC:  Recent Labs Lab 07/27/15 0631 07/28/15 0238 07/29/15 0512 07/30/15 0404 07/31/15 0416 08/01/15 0346 08/02/15 0403  WBC 9.3 10.5 9.8 11.3* 11.1* 8.7 8.5  NEUTROABS 7.4 7.6  --   --   --   --   --   HGB 9.4* 8.6* 9.1* 9.1* 9.9* 9.1* 8.3*  HCT 29.1* 26.6* 28.8* 28.4* 31.0* 28.5* 26.7*  MCV 77.4* 77.6* 78.3 79.3 79.1 78.7 78.1  PLT 285 275 297 297 359 386 382   Cardiac Enzymes:  Recent Labs Lab 07/26/15 1931 07/27/15 0145 07/27/15 0631 07/27/15 1230  TROPONINI 0.04* 0.07* 0.05* 0.05*   BNP: BNP (last 3 results)  Recent Labs  06/11/15 2255 07/26/15 1931 07/27/15 0150  BNP 51.1 37.8 41.3    ProBNP (last 3 results) No results for input(s): PROBNP in the last 8760 hours.  CBG:  Recent Labs Lab 08/01/15 1210 08/01/15 1713 08/01/15 2338 08/02/15 0755 08/02/15 1133  GLUCAP 166* 95 168* 240* 83       Signed:  Wilferd Ritson  [facility] 08/02/2015, 2:11 PM

## 2015-08-02 NOTE — Progress Notes (Signed)
Occupational Therapy Treatment Patient Details Name: Amber [name] MRN: [id number] DOB: February 04, 1956 Today's Date: 08/02/2015    History of present illness Amber [name] is a 59 y.o. 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   OT comments  Focus of session included lower body dressing using adaptive equipment of a reacher and sock-aid as well as toilet transfers. Pt was able to demonstrate safe and proper usage of adaptive equipment required for ADL independence. Pt performed toilet transfer with min guard assistance while ambulating to bedside commode. Therapist recommended tub transfer bench and 3-in-1 BSC as equipment would be beneficial to pt success for returning home safely.  Follow Up Recommendations  Home health OT    Equipment Recommendations  3 in 1 bedside comode;Tub/shower bench;Other (comment) (Pt requesting grab bars )    Recommendations for Other Services      Precautions / Restrictions Precautions Precautions: Fall Restrictions Weight Bearing Restrictions: No       Mobility Bed Mobility Overal bed mobility:  (Pt found in chair)                Transfers Overall transfer level: Needs assistance Equipment used: Rolling walker (2 wheeled) Transfers: Sit to/from Stand Sit to Stand: Min guard         General transfer comment: Pt relies heavily on UE strength when performing sit to stand transfer from [facility] and chair    Balance                                   ADL Overall ADL's : Needs assistance/impaired                     Lower Body Dressing: Supervision/safety;With adaptive equipment;Sit to/from stand Lower Body Dressing Details (indicate cue type and reason): verbal cues for proper use of  sock-aid Toilet Transfer: Min guard;Ambulation;BSC           Functional mobility during ADLs: Min guard;Rolling walker General ADL Comments: Pt performed LB dressing using pt's reacher and sock-aid. Pt required min verbal cueing for correct positioning of sock-aid to don sock.      Vision                     Perception     Praxis      Cognition   Behavior During Therapy: WFL for tasks assessed/performed Overall Cognitive Status: Within Functional Limits for tasks assessed                       Extremity/Trunk Assessment               Exercises     Shoulder Instructions       General Comments      Pertinent Vitals/ Pain       Pain Assessment: 0-10 Pain Score: 10-Worst pain ever Pain Location: Back Pain Descriptors / Indicators: Aching;Grimacing Pain Intervention(s): Repositioned  Home Living                                          Prior Functioning/Environment  Frequency Min 2X/week     Progress Toward Goals  OT Goals(current goals can now be found in the care plan section)  Progress towards OT goals: Progressing toward goals  Acute Rehab OT Goals Patient Stated Goal: ready to go home OT Goal Formulation: With patient Time For Goal Achievement: 08/11/15 Potential to Achieve Goals: Good ADL Goals Pt Will Perform Grooming: with set-up;with supervision;standing Pt Will Perform Lower Body Dressing: with min guard assist;with adaptive equipment;sit to/from stand Pt Will Transfer to Toilet: with min guard assist;ambulating;bedside commode Pt Will Perform Toileting - Clothing Manipulation and hygiene: with min assist;sit to/from stand Additional ADL Goal #1: Pt will be min guard A for in and OOB  Plan Discharge plan needs to be updated    Co-evaluation                 End of Session Equipment Utilized During Treatment: Gait belt;Rolling walker   Activity Tolerance Patient tolerated  treatment well   Patient Left in chair;with call bell/phone within reach   Nurse Communication Patient requests pain meds;Other (comment) (Pt reported back pain of 10/10)        Time: 1030-1054 OT Time Calculation (min): 24 min  Charges: OT General Charges $OT Visit: 1 Procedure OT Treatments $Self Care/Home Management : 23-37 mins  Smiley Houseman 08/02/2015, 11:30 AM

## 2015-08-24 ENCOUNTER — Encounter (HOSPITAL_COMMUNITY): Payer: Self-pay

## 2015-08-24 ENCOUNTER — Emergency Department (HOSPITAL_COMMUNITY): Payer: Medicaid Other

## 2015-08-24 ENCOUNTER — Observation Stay (HOSPITAL_COMMUNITY)
Admission: EM | Admit: 2015-08-24 | Discharge: 2015-08-25 | Disposition: A | Discharge status: Home / Self Care | Payer: Medicaid Other | Attending: Internal Medicine | Admitting: Internal Medicine

## 2015-08-24 DIAGNOSIS — R0789 Other chest pain: Secondary | ICD-10-CM | POA: Diagnosis not present

## 2015-08-24 DIAGNOSIS — J45909 Unspecified asthma, uncomplicated: Secondary | ICD-10-CM | POA: Insufficient documentation

## 2015-08-24 DIAGNOSIS — R296 Repeated falls: Secondary | ICD-10-CM | POA: Diagnosis not present

## 2015-08-24 DIAGNOSIS — Z87891 Personal history of nicotine dependence: Secondary | ICD-10-CM | POA: Insufficient documentation

## 2015-08-24 DIAGNOSIS — W1830XA Fall on same level, unspecified, initial encounter: Secondary | ICD-10-CM | POA: Insufficient documentation

## 2015-08-24 DIAGNOSIS — M1990 Unspecified osteoarthritis, unspecified site: Secondary | ICD-10-CM | POA: Diagnosis not present

## 2015-08-24 DIAGNOSIS — I119 Hypertensive heart disease without heart failure: Secondary | ICD-10-CM | POA: Diagnosis present

## 2015-08-24 DIAGNOSIS — R079 Chest pain, unspecified: Secondary | ICD-10-CM | POA: Diagnosis present

## 2015-08-24 DIAGNOSIS — E119 Type 2 diabetes mellitus without complications: Secondary | ICD-10-CM | POA: Diagnosis not present

## 2015-08-24 DIAGNOSIS — M25512 Pain in left shoulder: Secondary | ICD-10-CM | POA: Insufficient documentation

## 2015-08-24 DIAGNOSIS — Z7982 Long term (current) use of aspirin: Secondary | ICD-10-CM | POA: Diagnosis not present

## 2015-08-24 DIAGNOSIS — Z79899 Other long term (current) drug therapy: Secondary | ICD-10-CM | POA: Insufficient documentation

## 2015-08-24 DIAGNOSIS — E876 Hypokalemia: Secondary | ICD-10-CM | POA: Diagnosis not present

## 2015-08-24 DIAGNOSIS — Z79891 Long term (current) use of opiate analgesic: Secondary | ICD-10-CM | POA: Diagnosis not present

## 2015-08-24 DIAGNOSIS — Z7984 Long term (current) use of oral hypoglycemic drugs: Secondary | ICD-10-CM | POA: Diagnosis not present

## 2015-08-24 DIAGNOSIS — E118 Type 2 diabetes mellitus with unspecified complications: Secondary | ICD-10-CM | POA: Diagnosis not present

## 2015-08-24 DIAGNOSIS — K219 Gastro-esophageal reflux disease without esophagitis: Secondary | ICD-10-CM | POA: Diagnosis not present

## 2015-08-24 DIAGNOSIS — Z87898 Personal history of other specified conditions: Secondary | ICD-10-CM

## 2015-08-24 DIAGNOSIS — R569 Unspecified convulsions: Secondary | ICD-10-CM | POA: Diagnosis not present

## 2015-08-24 DIAGNOSIS — I10 Essential (primary) hypertension: Secondary | ICD-10-CM | POA: Diagnosis not present

## 2015-08-24 LAB — COMPREHENSIVE METABOLIC PANEL
ALBUMIN: 3.1 g/dL — AB (ref 3.5–5.0)
ALT: 18 U/L (ref 14–54)
AST: 27 U/L (ref 15–41)
Alkaline Phosphatase: 90 U/L (ref 38–126)
Anion gap: 16 — ABNORMAL HIGH (ref 5–15)
BILIRUBIN TOTAL: 0.4 mg/dL (ref 0.3–1.2)
BUN: 11 mg/dL (ref 6–20)
CO2: 24 mmol/L (ref 22–32)
CREATININE: 1.05 mg/dL — AB (ref 0.44–1.00)
Calcium: 8.4 mg/dL — ABNORMAL LOW (ref 8.9–10.3)
Chloride: 99 mmol/L — ABNORMAL LOW (ref 101–111)
GFR calc Af Amer: >60 mL/min (ref >60)
GFR calc non Af Amer: 58 mL/min — ABNORMAL LOW (ref >60)
GLUCOSE: 167 mg/dL — AB (ref 65–99)
POTASSIUM: 2.6 mmol/L — AB (ref 3.5–5.1)
Sodium: 139 mmol/L (ref 135–145)
TOTAL PROTEIN: 6.9 g/dL (ref 6.5–8.1)

## 2015-08-24 LAB — CBC WITH DIFFERENTIAL/PLATELET
BASOS ABS: 0 10*3/uL (ref 0.0–0.1)
BASOS PCT: 0 %
Eosinophils Absolute: 0 10*3/uL (ref 0.0–0.7)
Eosinophils Relative: 0 %
HEMATOCRIT: 29.5 % — AB (ref 36.0–46.0)
HEMOGLOBIN: 9.6 g/dL — AB (ref 12.0–15.0)
LYMPHS PCT: 19 %
Lymphs Abs: 1.4 10*3/uL (ref 0.7–4.0)
MCH: 25.1 pg — ABNORMAL LOW (ref 26.0–34.0)
MCHC: 32.5 g/dL (ref 30.0–36.0)
MCV: 77.2 fL — AB (ref 78.0–100.0)
MONO ABS: 0.6 10*3/uL (ref 0.1–1.0)
Monocytes Relative: 8 %
NEUTROS ABS: 5.4 10*3/uL (ref 1.7–7.7)
NEUTROS PCT: 73 %
Platelets: 207 10*3/uL (ref 150–400)
RBC: 3.82 MIL/uL — AB (ref 3.87–5.11)
RDW: 15.8 % — AB (ref 11.5–15.5)
WBC: 7.5 10*3/uL (ref 4.0–10.5)

## 2015-08-24 LAB — I-STAT TROPONIN, ED: Troponin i, poc: 0 ng/mL (ref 0.00–0.08)

## 2015-08-24 LAB — GLUCOSE, CAPILLARY
GLUCOSE-CAPILLARY: 162 mg/dL — AB (ref 65–99)
GLUCOSE-CAPILLARY: 132 mg/dL — AB (ref 65–99)
GLUCOSE-CAPILLARY: 157 mg/dL — AB (ref 65–99)
Glucose-Capillary: 134 mg/dL — ABNORMAL HIGH (ref 65–99)
Glucose-Capillary: 147 mg/dL — ABNORMAL HIGH (ref 65–99)

## 2015-08-24 LAB — TROPONIN I
TROPONIN I: 0.04 ng/mL — AB (ref <0.031)
TROPONIN I: 0.03 ng/mL (ref <0.031)
Troponin I: 0.03 ng/mL (ref <0.031)

## 2015-08-24 LAB — LIPASE, BLOOD: Lipase: 20 U/L — ABNORMAL LOW (ref 22–51)

## 2015-08-24 MED ORDER — METFORMIN HCL ER 500 MG PO TB24: 500.0000 mg | ORAL_TABLET | Freq: Every day | ORAL | Status: DC

## 2015-08-24 MED ORDER — DIAZEPAM 5 MG PO TABS
5.0000 mg | ORAL_TABLET | Freq: Two times a day (BID) | ORAL | Status: DC | PRN
  Administered 2015-08-24 (×2): 5 mg via ORAL
  Filled 2015-08-24 (×2): qty 1

## 2015-08-24 MED ORDER — ACETAMINOPHEN 325 MG PO TABS: 650.0000 mg | ORAL_TABLET | ORAL | Status: DC | PRN

## 2015-08-24 MED ORDER — AMLODIPINE BESYLATE 5 MG PO TABS
5.0000 mg | ORAL_TABLET | Freq: Every day | ORAL | Status: DC
  Administered 2015-08-24 – 2015-08-25 (×2): 5 mg via ORAL
  Filled 2015-08-24 (×2): qty 1

## 2015-08-24 MED ORDER — MORPHINE SULFATE (PF) 4 MG/ML IV SOLN
6.0000 mg | Freq: Once | INTRAVENOUS | Status: AC
  Administered 2015-08-24: 6 mg via INTRAVENOUS
  Filled 2015-08-24: qty 2

## 2015-08-24 MED ORDER — POTASSIUM CHLORIDE CRYS ER 20 MEQ PO TBCR: 40.0000 meq | EXTENDED_RELEASE_TABLET | Freq: Two times a day (BID) | ORAL | Status: DC

## 2015-08-24 MED ORDER — POLYETHYLENE GLYCOL 3350 17 G PO PACK
17.0000 g | PACK | Freq: Two times a day (BID) | ORAL | Status: DC
  Administered 2015-08-24 – 2015-08-25 (×2): 17 g via ORAL
  Filled 2015-08-24 (×3): qty 1

## 2015-08-24 MED ORDER — ALBUTEROL SULFATE HFA 108 (90 BASE) MCG/ACT IN AERS: 2.0000 | INHALATION_SPRAY | Freq: Four times a day (QID) | RESPIRATORY_TRACT | Status: DC | PRN

## 2015-08-24 MED ORDER — OXYCODONE HCL 5 MG PO TABS
20.0000 mg | ORAL_TABLET | ORAL | Status: DC | PRN
  Administered 2015-08-24 – 2015-08-25 (×6): 20 mg via ORAL
  Filled 2015-08-24 (×6): qty 4

## 2015-08-24 MED ORDER — POTASSIUM CHLORIDE 10 MEQ/100ML IV SOLN
10.0000 meq | Freq: Once | INTRAVENOUS | Status: AC
  Administered 2015-08-24: 10 meq via INTRAVENOUS
  Filled 2015-08-24: qty 100

## 2015-08-24 MED ORDER — POTASSIUM CHLORIDE CRYS ER 20 MEQ PO TBCR
60.0000 meq | EXTENDED_RELEASE_TABLET | Freq: Once | ORAL | Status: AC
  Administered 2015-08-24: 60 meq via ORAL
  Filled 2015-08-24: qty 3

## 2015-08-24 MED ORDER — TIZANIDINE HCL 4 MG PO TABS
4.0000 mg | ORAL_TABLET | Freq: Three times a day (TID) | ORAL | Status: DC
  Administered 2015-08-24 – 2015-08-25 (×7): 4 mg via ORAL
  Filled 2015-08-24 (×13): qty 1

## 2015-08-24 MED ORDER — INSULIN ASPART 100 UNIT/ML ~~LOC~~ SOLN
0.0000 [IU] | SUBCUTANEOUS | Status: DC
  Administered 2015-08-24: 2 [IU] via SUBCUTANEOUS
  Administered 2015-08-24 (×2): 1 [IU] via SUBCUTANEOUS
  Administered 2015-08-24: 2 [IU] via SUBCUTANEOUS
  Administered 2015-08-24: 1 [IU] via SUBCUTANEOUS
  Administered 2015-08-25: 2 [IU] via SUBCUTANEOUS
  Administered 2015-08-25: 3 [IU] via SUBCUTANEOUS

## 2015-08-24 MED ORDER — POTASSIUM CHLORIDE 10 MEQ/100ML IV SOLN
10.0000 meq | INTRAVENOUS | Status: AC
  Administered 2015-08-24: 10 meq via INTRAVENOUS
  Filled 2015-08-24 (×2): qty 100

## 2015-08-24 MED ORDER — PREDNISONE 10 MG PO TABS
15.0000 mg | ORAL_TABLET | Freq: Every day | ORAL | Status: DC
  Administered 2015-08-24 – 2015-08-25 (×2): 15 mg via ORAL
  Filled 2015-08-24 (×4): qty 1

## 2015-08-24 MED ORDER — GABAPENTIN 300 MG PO CAPS
300.0000 mg | ORAL_CAPSULE | Freq: Two times a day (BID) | ORAL | Status: DC
  Administered 2015-08-24 – 2015-08-25 (×3): 300 mg via ORAL
  Filled 2015-08-24 (×3): qty 1

## 2015-08-24 MED ORDER — DOXYCYCLINE HYCLATE 100 MG PO TABS
50.0000 mg | ORAL_TABLET | Freq: Three times a day (TID) | ORAL | Status: DC
  Administered 2015-08-24 – 2015-08-25 (×5): 50 mg via ORAL
  Filled 2015-08-24 (×5): qty 1

## 2015-08-24 MED ORDER — POTASSIUM CHLORIDE CRYS ER 20 MEQ PO TBCR
40.0000 meq | EXTENDED_RELEASE_TABLET | Freq: Every day | ORAL | Status: DC
  Administered 2015-08-24: 40 meq via ORAL
  Filled 2015-08-24: qty 2

## 2015-08-24 MED ORDER — POTASSIUM CHLORIDE CRYS ER 20 MEQ PO TBCR
40.0000 meq | EXTENDED_RELEASE_TABLET | Freq: Once | ORAL | Status: AC
  Administered 2015-08-24: 40 meq via ORAL
  Filled 2015-08-24: qty 2

## 2015-08-24 MED ORDER — MORPHINE SULFATE (PF) 4 MG/ML IV SOLN
6.0000 mg | Freq: Once | INTRAVENOUS | Status: AC
Start: 2015-08-24 — End: 2015-08-24
  Administered 2015-08-24: 6 mg via INTRAVENOUS
  Filled 2015-08-24: qty 2

## 2015-08-24 MED ORDER — POTASSIUM CHLORIDE 10 MEQ/100ML IV SOLN
10.0000 meq | Freq: Once | INTRAVENOUS | Status: DC
  Administered 2015-08-24: 10 meq via INTRAVENOUS

## 2015-08-24 MED ORDER — ALBUTEROL SULFATE (2.5 MG/3ML) 0.083% IN NEBU
2.5000 mg | INHALATION_SOLUTION | Freq: Four times a day (QID) | RESPIRATORY_TRACT | Status: DC | PRN
  Administered 2015-08-24: 2.5 mg via RESPIRATORY_TRACT
  Filled 2015-08-24: qty 3

## 2015-08-24 MED ORDER — OXYMORPHONE HCL ER 10 MG PO T12A
40.0000 mg | EXTENDED_RELEASE_TABLET | Freq: Two times a day (BID) | ORAL | Status: DC
  Administered 2015-08-24 – 2015-08-25 (×3): 40 mg via ORAL
  Filled 2015-08-24 (×3): qty 4

## 2015-08-24 MED ORDER — FUROSEMIDE 40 MG PO TABS
40.0000 mg | ORAL_TABLET | Freq: Every day | ORAL | Status: DC
  Administered 2015-08-24 – 2015-08-25 (×2): 40 mg via ORAL
  Filled 2015-08-24 (×4): qty 1

## 2015-08-24 MED ORDER — ENOXAPARIN SODIUM 40 MG/0.4ML ~~LOC~~ SOLN: 40.0000 mg | Freq: Every day | SUBCUTANEOUS | Status: DC

## 2015-08-24 MED ORDER — SODIUM CHLORIDE 0.9 % IV BOLUS (SEPSIS)
1000.0000 mL | Freq: Once | INTRAVENOUS | Status: AC
  Administered 2015-08-24: 1000 mL via INTRAVENOUS

## 2015-08-24 MED ORDER — MOMETASONE FURO-FORMOTEROL FUM 100-5 MCG/ACT IN AERO
2.0000 | INHALATION_SPRAY | Freq: Two times a day (BID) | RESPIRATORY_TRACT | Status: DC
  Administered 2015-08-24 – 2015-08-25 (×3): 2 via RESPIRATORY_TRACT
  Filled 2015-08-24: qty 8.8

## 2015-08-24 MED ORDER — ATORVASTATIN CALCIUM 10 MG PO TABS
10.0000 mg | ORAL_TABLET | Freq: Every day | ORAL | Status: DC
  Administered 2015-08-24 – 2015-08-25 (×2): 10 mg via ORAL
  Filled 2015-08-24 (×2): qty 1

## 2015-08-24 MED ORDER — ASPIRIN EC 81 MG PO TBEC
81.0000 mg | DELAYED_RELEASE_TABLET | Freq: Every day | ORAL | Status: DC
  Administered 2015-08-24 – 2015-08-25 (×2): 81 mg via ORAL
  Filled 2015-08-24 (×2): qty 1

## 2015-08-24 MED ORDER — LEVETIRACETAM 500 MG PO TABS
500.0000 mg | ORAL_TABLET | Freq: Two times a day (BID) | ORAL | Status: DC
  Administered 2015-08-24 – 2015-08-25 (×3): 500 mg via ORAL
  Filled 2015-08-24 (×3): qty 1

## 2015-08-24 MED ORDER — ALLOPURINOL 300 MG PO TABS
300.0000 mg | ORAL_TABLET | Freq: Every day | ORAL | Status: DC
  Administered 2015-08-24 – 2015-08-25 (×2): 300 mg via ORAL
  Filled 2015-08-24 (×2): qty 1

## 2015-08-24 MED ORDER — PANTOPRAZOLE SODIUM 40 MG PO TBEC
80.0000 mg | DELAYED_RELEASE_TABLET | Freq: Every day | ORAL | Status: DC
  Administered 2015-08-24 – 2015-08-25 (×2): 80 mg via ORAL
  Filled 2015-08-24 (×4): qty 2

## 2015-08-24 MED ORDER — METOPROLOL SUCCINATE ER 25 MG PO TB24
25.0000 mg | ORAL_TABLET | Freq: Every day | ORAL | Status: DC
  Administered 2015-08-24 – 2015-08-25 (×2): 25 mg via ORAL
  Filled 2015-08-24 (×2): qty 1

## 2015-08-24 MED ORDER — ONDANSETRON HCL 4 MG/2ML IJ SOLN: 4.0000 mg | Freq: Four times a day (QID) | INTRAMUSCULAR | Status: DC | PRN

## 2015-08-24 MED ORDER — KETOROLAC TROMETHAMINE 30 MG/ML IJ SOLN
30.0000 mg | Freq: Once | INTRAMUSCULAR | Status: AC
  Administered 2015-08-24: 30 mg via INTRAVENOUS
  Filled 2015-08-24: qty 1

## 2015-08-24 NOTE — Care Management Note (Addendum)
Case Management Note  Patient Details  Name: Amber [name] MRN: [id number] Date of Birth: 1956-09-15  Subjective/Objective:    Pt admitted with chest pain              Action/Plan:  Pt is independent from home with daughter.  CM will continue to monitor for disposition needs.   Expected Discharge Date:                  Expected Discharge Plan:  Home/Self Care  In-House Referral:     Discharge planning Services  CM Consult  Post Acute Care Choice:    Choice offered to:     DME Arranged:   [facility] D[facility] Agency:   [facility]  HH Arranged:   RN, Aide HH Agency:   [facility]  Status of Service:  Complete, will sign off  Medicare Important Message Given:    Date Medicare IM Given:    Medicare IM give by:    Date Additional Medicare IM Given:    Additional Medicare Important Message give by:     If discussed at Long Length of Stay Meetings, dates discussed:    Additional Comments: 08/25/2015 MD ordered PT, OT, RN and SW.  Pts insurance will not cover PT nor OT, pt stated she can not pay for [facility] nor outpt therapy.  Pt refused SW and requested aide to assist with ADLs.  Pt accepted HHRN.  Pt offered choice for agency, pt chose [facility], agency contacted and referral accepted.  Address verified, correct phone number is [phone] MD is aware of change in disposition plan.   08/24/15  Pt requested RW, pt states she has RW currently that is too short, pt states she has to bend over to use it which hurts her back.  MD gave CM verbal order for RW.  CM offered pt choice, pt chose [facility] and agency accepted referral Cherylann Parr, RN 08/24/2015, 3:40 PM

## 2015-08-24 NOTE — ED Provider Notes (Signed)
CSN: [id number]     Arrival date & time 08/24/15  9528 History  By signing my name below, I, Sonum Patel, attest that this documentation has been prepared under the direction and in the presence of Tomasita Crumble, MD. Electronically Signed: Sonum Patel, Neurosurgeon. 08/24/2015. 1:41 AM.    Chief Complaint  Patient presents with  . Chest Pain  . Shortness of Breath   The history is provided by the patient. No language interpreter was used.     HPI Comments: Marjo Grosvenor is a 59 y.o. female brought in by ambulance, who presents to the Emergency Department complaining of 4-5 days of intermittent left-sided chest pain that is worse with deep breathing. She reports associated SOB for the past couple of weeks after being diagnosed with PNA. She has increased fatigue and also reports 2 falls today. She reports striking her left shoulder during the first fall and her right knee during the second fall. She reports taking oxycodone and Opana at home for pain. She denies vomiting, diaphoresis.   Past Medical History  Diagnosis Date  . Arthritis   . Asthma   . Hypertension   . Constipation   . GERD (gastroesophageal reflux disease)   . Diabetes mellitus without complication (HCC)   . Chronic pain   . Seizures (HCC)     last seizure March 2015   Past Surgical History  Procedure Laterality Date  . Back surgery    . Cholecystectomy    . Appendectomy     Family History  Problem Relation Age of Onset  . Kidney failure Mother   . Hypertension Sister   . Hypertension Brother    Social History  Substance Use Topics  . Smoking status: Former Games developer  . Smokeless tobacco: Never Used  . Alcohol Use: No     Comment: admits to 2 drinks/week   OB History    No data available     Review of Systems  10 Systems reviewed and all are negative for acute change except as noted in the HPI.    Allergies  Hydromorphone and Ivp dye  Home Medications   Prior to Admission medications   Medication Sig  Start Date End Date Taking? Authorizing Provider  albuterol (PROVENTIL HFA;VENTOLIN HFA) 108 (90 BASE) MCG/ACT inhaler Inhale 2 puffs into the lungs every 6 (six) hours as needed for wheezing or shortness of breath.    Historical Provider, MD  allopurinol (ZYLOPRIM) 300 MG tablet Take 300 mg by mouth daily.    Historical Provider, MD  amLODipine (NORVASC) 5 MG tablet Take 5 mg by mouth daily.    Historical Provider, MD  aspirin EC 81 MG tablet Take 1 tablet (81 mg total) by mouth daily. 06/14/15   Richarda Overlie, MD  diazepam (VALIUM) 5 MG tablet Take 1 tablet (5 mg total) by mouth every 12 (twelve) hours as needed for muscle spasms. 08/02/15   Zannie Cove, MD  esomeprazole (NEXIUM) 40 MG capsule Take 40 mg by mouth daily at 12 noon.    Historical Provider, MD  Fluticasone-Salmeterol (ADVAIR) 250-50 MCG/DOSE AEPB Inhale 1 puff into the lungs 2 (two) times daily.    Historical Provider, MD  folic acid (FOLVITE) 1 MG tablet Take 1 mg by mouth daily.    Historical Provider, MD  furosemide (LASIX) 40 MG tablet Take 1 tablet (40 mg total) by mouth daily. 06/14/15   Richarda Overlie, MD  gabapentin (NEURONTIN) 300 MG capsule Take 1 capsule (300 mg total) by mouth 2 (two)  times daily. 06/14/15   Richarda Overlie, MD  levETIRAcetam (KEPPRA) 500 MG tablet Take 1 tablet (500 mg total) by mouth 2 (two) times daily. 01/27/15   Calvert Cantor, MD  levofloxacin (LEVAQUIN) 500 MG tablet Take 1 tablet (500 mg total) by mouth daily. For 3 days 08/02/15   Zannie Cove, MD  metFORMIN (GLUCOPHAGE-XR) 500 MG 24 hr tablet Take 500 mg by mouth daily with breakfast.    Historical Provider, MD  metoprolol succinate (TOPROL-XL) 25 MG 24 hr tablet Take 25 mg by mouth daily.    Historical Provider, MD  Oxycodone HCl 20 MG TABS Take 20 mg by mouth every 4 (four) hours.    Historical Provider, MD  oxymorphone (OPANA ER) 40 MG 12 hr tablet Take 40 mg by mouth every 12 (twelve) hours.    Historical Provider, MD  polyethylene glycol (MIRALAX /  GLYCOLAX) packet Take 17 g by mouth 2 (two) times daily. 06/14/15   Richarda Overlie, MD  potassium chloride SA (K-DUR,KLOR-CON) 20 MEQ tablet Take 2 tablets (40 mEq total) by mouth daily. 01/27/15   Calvert Cantor, MD  Vitamin D, Ergocalciferol, (DRISDOL) 50000 UNITS CAPS capsule Take 1 capsule (50,000 Units total) by mouth every 7 (seven) days. Sunday 01/27/15   Calvert Cantor, MD  VITAMIN E PO Take 1 capsule by mouth daily.    Historical Provider, MD   There were no vitals taken for this visit. Physical Exam  Constitutional: She is oriented to person, place, and time. She appears well-developed and well-nourished. She appears distressed.  HENT:  Head: Normocephalic and atraumatic.  Nose: Nose normal.  Mouth/Throat: Oropharynx is clear and moist. No oropharyngeal exudate.  Eyes: Conjunctivae and EOM are normal. Pupils are equal, round, and reactive to light. No scleral icterus.  Neck: Normal range of motion. Neck supple. No JVD present. No tracheal deviation present. No thyromegaly present.  Cardiovascular: Normal rate, regular rhythm and normal heart sounds.  Exam reveals no gallop and no friction rub.   No murmur heard. Pulmonary/Chest: Effort normal and breath sounds normal. No respiratory distress. She has no wheezes. She exhibits tenderness (Left anterior chest wall tenderness.).  Abdominal: Soft. Bowel sounds are normal. She exhibits no distension and no mass. There is no tenderness. There is no rebound and no guarding.  Musculoskeletal: Normal range of motion. She exhibits edema and tenderness.  Left shoulder tenderness. Full ROM of LUE. Normal pulses and sensation distally.   Lymphadenopathy:    She has no cervical adenopathy.  Neurological: She is alert and oriented to person, place, and time. No cranial nerve deficit. She exhibits normal muscle tone.  Skin: Skin is warm and dry. No rash noted. No erythema. No pallor.  Nursing note and vitals reviewed.   ED Course  Procedures (including  critical care time)  DIAGNOSTIC STUDIES: Oxygen Saturation is 93% on RA, adequate by my interpretation.    COORDINATION OF CARE: 1:22 AM Discussed treatment plan with pt at bedside and pt agreed to plan.   Labs Review Labs Reviewed  CBC WITH DIFFERENTIAL/PLATELET - Abnormal; Notable for the following:    RBC 3.82 (*)    Hemoglobin 9.6 (*)    HCT 29.5 (*)    MCV 77.2 (*)    MCH 25.1 (*)    RDW 15.8 (*)    All other components within normal limits  COMPREHENSIVE METABOLIC PANEL - Abnormal; Notable for the following:    Potassium 2.6 (*)    Chloride 99 (*)    Glucose, Bld  167 (*)    Creatinine, Ser 1.05 (*)    Calcium 8.4 (*)    Albumin 3.1 (*)    GFR calc non Af Amer 58 (*)    Anion gap 16 (*)    All other components within normal limits  LIPASE, BLOOD - Abnormal; Notable for the following:    Lipase 20 (*)    All other components within normal limits  TROPONIN I - Abnormal; Notable for the following:    Troponin I 0.04 (*)    All other components within normal limits  GLUCOSE, CAPILLARY - Abnormal; Notable for the following:    Glucose-Capillary 162 (*)    All other components within normal limits  GLUCOSE, CAPILLARY - Abnormal; Notable for the following:    Glucose-Capillary 132 (*)    All other components within normal limits  GLUCOSE, CAPILLARY - Abnormal; Notable for the following:    Glucose-Capillary 157 (*)    All other components within normal limits  TROPONIN I  TROPONIN I  I-STAT TROPOININ, ED    Imaging Review Dg Chest 2 View  08/24/2015   CLINICAL DATA:  Shoulder and left anterior chest wall pain after falling onto left side.  EXAM: CHEST  2 VIEW  COMPARISON:  07/29/2015  FINDINGS: There are intact appearances of the thoracic spinal fixation hardware. No acute fracture is evident. There is no pneumothorax. There is no effusion. Mild chronic lateral costophrenic angle pleural thickening is present on the left. The lungs are clear. Mediastinal contours are  normal.  IMPRESSION: No evidence of significant intrathoracic traumatic injury.   Electronically Signed   By: Ellery Plunk M.D.   On: 08/24/2015 02:07   Dg Shoulder Left  08/24/2015   CLINICAL DATA:  Left shoulder pain after falling on the left side.  EXAM: LEFT SHOULDER - 2+ VIEW  COMPARISON:  None.  FINDINGS: Negative for acute fracture, dislocation or radiopaque foreign body. There is old fragmentation of the distal clavicle, probably sequela from remote trauma. AC joint is intact. Glenohumeral articulation is intact.  IMPRESSION: Negative for acute fracture.   Electronically Signed   By: Ellery Plunk M.D.   On: 08/24/2015 02:08      EKG Interpretation   Date/Time:  Tuesday August 24 2015 00:58:24 EDT Ventricular Rate:  92 PR Interval:  119 QRS Duration: 71 QT Interval:  366 QTC Calculation: 453 R Axis:   60 Text Interpretation:  Sinus rhythm Borderline T wave abnormalities  Artifact No significant change since last tracing Confirmed by Erroll Luna [phone]) on 08/24/2015 1:57:22 AM      MDM   Final diagnoses:  None   Patient presents to the emergency department for evaluation of chest pain going on for 4-5 days. She did have a recent pneumonia and she is worried about a recurrence. She has no history of infectious etiology. Patient also complains of frequent falls injuring her left shoulder and right knee. Physical exam is unremarkable. X-rays were obtained as she has tenderness of the left chest wall and left shoulder. She was given morphine 6 mg for pain. This dose was repeated one time. Patient continues to be in pain.  3rd dose for morphine given along with toradol.  She will require admission for pain control.  I do not believe this is cardiac pain.   I personally performed the services described in this documentation, which was scribed in my presence. The recorded information has been reviewed and is accurate.   Tomasita Crumble, MD  08/24/15 1725 

## 2015-08-24 NOTE — Progress Notes (Signed)
Pt arrived to 2w. Telemetry applied. Pt oriented to room and unit. Pt alert and oriented and patient is currently pain free. Pt IV was blown upon arrival. Pt supposed to get potassium IV.    Will continue to monitor.   Edgardo Roys

## 2015-08-24 NOTE — ED Notes (Signed)
Per EMS: Pt seen 2 weeks ago, for pnumonia and a "clot on the lung." Since discharge, pt complaining of increasing fatigue. Today began experiencing L chest and back pain. + cough, nonproductive. BP 100/60. EMS gave 1 duoneb enroute d/t bilateral wheezing.

## 2015-08-24 NOTE — H&P (Signed)
[facility] History and Physical  Emalene Gazzo FXT:[id number] DOB: 1956/08/21 DOA: 08/24/2015  Referring physician: EDP PCP: [name], MD   Chief Complaint: Chest pain   HPI: Amber [name] is a 59 y.o. female who presents to the ED with several months of intermittent left-sided chest pain.  Pain is worse with deep breathing, worse with palpation of her anterior left chest and worse with certain movements of her left shoulder (lifting her self up, etc).  She reports that today she was very angry with a family member, was being rushed to get in to her house, and so fell down.  She landed on her left shoulder.  Her left shoulder and chest pain became acutely worse after the fall where she landed on the shoulder.  She is still having chest pain in the ED despite 18mg  of morphine.  Review of Systems: Systems reviewed.  As above, otherwise negative  Past Medical History  Diagnosis Date  . Arthritis   . Asthma   . Hypertension   . Constipation   . GERD (gastroesophageal reflux disease)   . Diabetes mellitus without complication (HCC)   . Chronic pain   . Seizures (HCC)     last seizure March 2015   Past Surgical History  Procedure Laterality Date  . Back surgery    . Cholecystectomy    . Appendectomy     Social History:  reports that she has quit smoking. She has never used smokeless tobacco. She reports that she does not drink alcohol or use illicit drugs.  Allergies  Allergen Reactions  . Hydromorphone   . Ivp Dye [Iodinated Diagnostic Agents] Itching    Family History  Problem Relation Age of Onset  . Kidney failure Mother   . Hypertension Sister   . Hypertension Brother      Prior to Admission medications   Medication Sig Start Date End Date Taking? Authorizing Provider  albuterol (PROVENTIL HFA;VENTOLIN HFA) 108 (90 BASE) MCG/ACT inhaler Inhale 2 puffs into the lungs every 6 (six) hours as needed for wheezing or shortness of breath.   Yes Historical Provider,  MD  allopurinol (ZYLOPRIM) 300 MG tablet Take 300 mg by mouth daily.   Yes Historical Provider, MD  amLODipine (NORVASC) 5 MG tablet Take 5 mg by mouth daily.   Yes Historical Provider, MD  aspirin EC 81 MG tablet Take 1 tablet (81 mg total) by mouth daily. 06/14/15  Yes Richarda Overlie, MD  atorvastatin (LIPITOR) 10 MG tablet Take 10 mg by mouth daily. 08/13/15  Yes Historical Provider, MD  diazepam (VALIUM) 5 MG tablet Take 1 tablet (5 mg total) by mouth every 12 (twelve) hours as needed for muscle spasms. 08/02/15  Yes Zannie Cove, MD  doxycycline (VIBRAMYCIN) 50 MG capsule Take 50 mg by mouth 3 (three) times daily.   Yes Historical Provider, MD  esomeprazole (NEXIUM) 40 MG capsule Take 40 mg by mouth daily at 12 noon.   Yes Historical Provider, MD  Fluticasone-Salmeterol (ADVAIR) 250-50 MCG/DOSE AEPB Inhale 1 puff into the lungs 2 (two) times daily.   Yes Historical Provider, MD  folic acid (FOLVITE) 1 MG tablet Take 1 mg by mouth daily.   Yes Historical Provider, MD  furosemide (LASIX) 40 MG tablet Take 1 tablet (40 mg total) by mouth daily. 06/14/15  Yes Richarda Overlie, MD  gabapentin (NEURONTIN) 300 MG capsule Take 1 capsule (300 mg total) by mouth 2 (two) times daily. 06/14/15  Yes Richarda Overlie, MD  levETIRAcetam (KEPPRA) 500 MG tablet  Take 1 tablet (500 mg total) by mouth 2 (two) times daily. 01/27/15  Yes Calvert Cantor, MD  LINZESS 145 MCG CAPS capsule Take 145 mcg by mouth daily as needed. 08/13/15  Yes Historical Provider, MD  metFORMIN (GLUCOPHAGE-XR) 500 MG 24 hr tablet Take 500 mg by mouth daily with breakfast.   Yes Historical Provider, MD  metoprolol succinate (TOPROL-XL) 25 MG 24 hr tablet Take 25 mg by mouth daily.   Yes Historical Provider, MD  Oxycodone HCl 20 MG TABS Take 20 mg by mouth every 4 (four) hours.   Yes Historical Provider, MD  oxymorphone (OPANA ER) 40 MG 12 hr tablet Take 40 mg by mouth every 12 (twelve) hours.   Yes Historical Provider, MD  polyethylene glycol (MIRALAX /  GLYCOLAX) packet Take 17 g by mouth 2 (two) times daily. 06/14/15  Yes Richarda Overlie, MD  potassium chloride SA (K-DUR,KLOR-CON) 20 MEQ tablet Take 2 tablets (40 mEq total) by mouth daily. 01/27/15  Yes Calvert Cantor, MD  predniSONE (DELTASONE) 5 MG tablet Take 15 mg by mouth daily. 08/13/15  Yes Historical Provider, MD  tiZANidine (ZANAFLEX) 4 MG tablet Take 4 mg by mouth 4 (four) times daily. 08/13/15  Yes Historical Provider, MD  Vitamin D, Ergocalciferol, (DRISDOL) 50000 UNITS CAPS capsule Take 1 capsule (50,000 Units total) by mouth every 7 (seven) days. Sunday Patient taking differently: Take 50,000 Units by mouth every 7 (seven) days.  01/27/15  Yes Calvert Cantor, MD  VITAMIN E PO Take 1 capsule by mouth daily.   Yes Historical Provider, MD   Physical Exam: Filed Vitals:   08/24/15 0544  BP: 116/68  Pulse: 116  Temp: 98 F (36.7 C)  Resp: 17    BP 116/68 mmHg  Pulse 116  Temp(Src) 98 F (36.7 C) (Oral)  Resp 17  SpO2 95%  General Appearance:    Alert, oriented, no distress, appears stated age  Head:    Normocephalic, atraumatic  Eyes:    PERRL, EOMI, sclera non-icteric        Nose:   Nares without drainage or epistaxis. Mucosa, turbinates normal  Throat:   Moist mucous membranes. Oropharynx without erythema or exudate.  Neck:   Supple. No carotid bruits.  No thyromegaly.  No lymphadenopathy.   Back:     No CVA tenderness, no spinal tenderness  Lungs:     Clear to auscultation bilaterally, without wheezes, rhonchi or rales  Chest wall:    No tenderness to palpitation  Heart:    Regular rate and rhythm without murmurs, gallops, rubs  Abdomen:     Soft, non-tender, nondistended, normal bowel sounds, no organomegaly  Genitalia:    deferred  Rectal:    deferred  Extremities:   No clubbing, cyanosis or edema.  Pulses:   2+ and symmetric all extremities  Skin:   Skin color, texture, turgor normal, no rashes or lesions  Lymph nodes:   Cervical, supraclavicular, and axillary nodes  normal  Neurologic:   CNII-XII intact. Normal strength, sensation and reflexes      throughout    Labs on Admission:  Basic Metabolic Panel:  Recent Labs Lab 08/24/15 0223  NA 139  K 2.6*  CL 99*  CO2 24  GLUCOSE 167*  BUN 11  CREATININE 1.05*  CALCIUM 8.4*   Liver Function Tests:  Recent Labs Lab 08/24/15 0223  AST 27  ALT 18  ALKPHOS 90  BILITOT 0.4  PROT 6.9  ALBUMIN 3.1*    Recent Labs Lab  08/24/15 0223  LIPASE 20*   No results for input(s): AMMONIA in the last 168 hours. CBC:  Recent Labs Lab 08/24/15 0223  WBC 7.5  NEUTROABS 5.4  HGB 9.6*  HCT 29.5*  MCV 77.2*  PLT 207   Cardiac Enzymes: No results for input(s): CKTOTAL, CKMB, CKMBINDEX, TROPONINI in the last 168 hours.  BNP (last 3 results) No results for input(s): PROBNP in the last 8760 hours. CBG: No results for input(s): GLUCAP in the last 168 hours.  Radiological Exams on Admission: Dg Chest 2 View  08/24/2015   CLINICAL DATA:  Shoulder and left anterior chest wall pain after falling onto left side.  EXAM: CHEST  2 VIEW  COMPARISON:  07/29/2015  FINDINGS: There are intact appearances of the thoracic spinal fixation hardware. No acute fracture is evident. There is no pneumothorax. There is no effusion. Mild chronic lateral costophrenic angle pleural thickening is present on the left. The lungs are clear. Mediastinal contours are normal.  IMPRESSION: No evidence of significant intrathoracic traumatic injury.   Electronically Signed   By: Ellery Plunk M.D.   On: 08/24/2015 02:07   Dg Shoulder Left  08/24/2015   CLINICAL DATA:  Left shoulder pain after falling on the left side.  EXAM: LEFT SHOULDER - 2+ VIEW  COMPARISON:  None.  FINDINGS: Negative for acute fracture, dislocation or radiopaque foreign body. There is old fragmentation of the distal clavicle, probably sequela from remote trauma. AC joint is intact. Glenohumeral articulation is intact.  IMPRESSION: Negative for acute fracture.    Electronically Signed   By: Ellery Plunk M.D.   On: 08/24/2015 02:08    EKG: Independently reviewed.  Assessment/Plan Principal Problem:   Chest pain Active Problems:   Hypokalemia   History of seizures   DM type 2 causing complication (HCC)   Frequent falls   Essential hypertension   1. Chest pain - 1. Most likely musculoskeletal with a chronic pain component 2. Likely exacerbated by the fall on to her left chest and shoulder today 3. Will admit, but plan on keeping patient only on her current home meds for pain control 1. There is significant concern that her frequent falls are due to multiple narcotics and valium that she takes chronically that has been documented by my colleagues during her admit last month 2. I discussed this with patient, and surprisingly she actually agreed that she didn't want to be on more pain meds right now, she is concerned because of recent news reports that the pain meds are addictive and killing people (I did confirm this with her). 4. Chest pain obs pathway 5. Serial trops 6. Tele monitor 7. NPO 2. DM2 - hold metformin, keeping patient NPO for possible stress test, use SSI low dose Q4H 3. HTN - continue home meds 4. Hypokalemia - replacing 5. H/o seizures - continue keppra    Code Status: Full Code  Family Communication: No family in room Disposition Plan: Admit to obs   Time spent: 70 min  GARDNER, JARED M. [facility] Pager [phone]  If 7AM-7PM, please contact the day team taking care of the patient [email or web address] Password [email or web address] 08/24/2015, 5:51 AM

## 2015-08-24 NOTE — Progress Notes (Signed)
[facility] PROGRESS NOTE   Lajoy Vanamburg VOJ:[id number] DOB: May 24, 1956 DOA: 08/24/2015 PCP: [name], MD  HPI/Subjective: Patient has very reproducible muscular skeletal top of pain. Very tender on the left side of her chest.  Assessment/Plan: Principal Problem:   Chest pain Active Problems:   Hypokalemia   History of seizures   DM type 2 causing complication (HCC)   Frequent falls   Essential hypertension   Chest pain, musculoskeletal   This is a no charge note, patient seen earlier today by my colleague Dr. Julian Reil. Patient seen and examined, and data base reviewed. Chest pain which is likely musculoskeletal in nature, second set of cardiac enzymes slightly elevated likely NOT presenting ACS. Severe hypokalemia with potassium of 2.6, replete with oral nightly supplements, check BMP in a.m.  Code Status: Full Code Family Communication: Plan discussed with the patient. Disposition Plan: Remains inpatient Diet: Diet Heart Room service appropriate?: Yes; Fluid consistency:: Thin  Consultants:  None  Procedures:  none  Antibiotics:  None (indicate start date, and stop date if known)   Objective: Filed Vitals:   08/24/15 0953  BP: 133/104  Pulse:   Temp:   Resp:     Intake/Output Summary (Last 24 hours) at 08/24/15 1413 Last data filed at 08/24/15 1100  Gross per 24 hour  Intake      0 ml  Output   1700 ml  Net  -1700 ml   Filed Weights   08/24/15 0706  Weight: 66.2 kg (145 lb 15.1 oz)    Exam: General: Alert and awake, oriented x3, not in any acute distress. HEENT: anicteric sclera, pupils reactive to light and accommodation, EOMI CVS: S1-S2 clear, no murmur rubs or gallops Chest: clear to auscultation bilaterally, no wheezing, rales or rhonchi Abdomen: soft nontender, nondistended, normal bowel sounds, no organomegaly Extremities: no cyanosis, clubbing or edema noted bilaterally Neuro: Cranial nerves II-XII intact, no focal neurological  deficits  Data Reviewed: Basic Metabolic Panel:  Recent Labs Lab 08/24/15 0223  NA 139  K 2.6*  CL 99*  CO2 24  GLUCOSE 167*  BUN 11  CREATININE 1.05*  CALCIUM 8.4*   Liver Function Tests:  Recent Labs Lab 08/24/15 0223  AST 27  ALT 18  ALKPHOS 90  BILITOT 0.4  PROT 6.9  ALBUMIN 3.1*    Recent Labs Lab 08/24/15 0223  LIPASE 20*   No results for input(s): AMMONIA in the last 168 hours. CBC:  Recent Labs Lab 08/24/15 0223  WBC 7.5  NEUTROABS 5.4  HGB 9.6*  HCT 29.5*  MCV 77.2*  PLT 207   Cardiac Enzymes:  Recent Labs Lab 08/24/15 0432 08/24/15 1122  TROPONINI 0.03 0.04*   BNP (last 3 results)  Recent Labs  06/11/15 2255 07/26/15 1931 07/27/15 0150  BNP 51.1 37.8 41.3    ProBNP (last 3 results) No results for input(s): PROBNP in the last 8760 hours.  CBG:  Recent Labs Lab 08/24/15 0703 08/24/15 1114  GLUCAP 162* 132*    Micro No results found for this or any previous visit (from the past 240 hour(s)).   Studies: Dg Chest 2 View  08/24/2015   CLINICAL DATA:  Shoulder and left anterior chest wall pain after falling onto left side.  EXAM: CHEST  2 VIEW  COMPARISON:  07/29/2015  FINDINGS: There are intact appearances of the thoracic spinal fixation hardware. No acute fracture is evident. There is no pneumothorax. There is no effusion. Mild chronic lateral costophrenic angle pleural thickening is present on the  left. The lungs are clear. Mediastinal contours are normal.  IMPRESSION: No evidence of significant intrathoracic traumatic injury.   Electronically Signed   By: Ellery Plunk M.D.   On: 08/24/2015 02:07   Dg Shoulder Left  08/24/2015   CLINICAL DATA:  Left shoulder pain after falling on the left side.  EXAM: LEFT SHOULDER - 2+ VIEW  COMPARISON:  None.  FINDINGS: Negative for acute fracture, dislocation or radiopaque foreign body. There is old fragmentation of the distal clavicle, probably sequela from remote trauma. AC joint is  intact. Glenohumeral articulation is intact.  IMPRESSION: Negative for acute fracture.   Electronically Signed   By: Ellery Plunk M.D.   On: 08/24/2015 02:08    Scheduled Meds: . allopurinol  300 mg Oral Daily  . amLODipine  5 mg Oral Daily  . aspirin EC  81 mg Oral Daily  . atorvastatin  10 mg Oral Daily  . doxycycline  50 mg Oral TID  . enoxaparin (LOVENOX) injection  40 mg Subcutaneous Daily  . furosemide  40 mg Oral Daily  . gabapentin  300 mg Oral BID  . insulin aspart  0-9 Units Subcutaneous 6 times per day  . levETIRAcetam  500 mg Oral BID  . metoprolol succinate  25 mg Oral Daily  . mometasone-formoterol  2 puff Inhalation BID  . oxymorphone  40 mg Oral Q12H  . pantoprazole  80 mg Oral Q1200  . polyethylene glycol  17 g Oral BID  . potassium chloride SA  60 mEq Oral Once  . predniSONE  15 mg Oral Q breakfast  . tiZANidine  4 mg Oral TID AC & HS   Continuous Infusions:      Time spent: 35 minutes    [facility]  [facility] Pager [phone] If 7PM-7AM, please contact night-coverage at [email or web address], password [facility] 08/24/2015, 2:13 PM

## 2015-08-25 DIAGNOSIS — E876 Hypokalemia: Secondary | ICD-10-CM

## 2015-08-25 DIAGNOSIS — I10 Essential (primary) hypertension: Secondary | ICD-10-CM

## 2015-08-25 DIAGNOSIS — Z8669 Personal history of other diseases of the nervous system and sense organs: Secondary | ICD-10-CM | POA: Diagnosis not present

## 2015-08-25 DIAGNOSIS — R0789 Other chest pain: Secondary | ICD-10-CM | POA: Diagnosis not present

## 2015-08-25 LAB — BASIC METABOLIC PANEL
Anion gap: 11 (ref 5–15)
BUN: 13 mg/dL (ref 6–20)
CHLORIDE: 101 mmol/L (ref 101–111)
CO2: 23 mmol/L (ref 22–32)
CREATININE: 1.02 mg/dL — AB (ref 0.44–1.00)
Calcium: 8.2 mg/dL — ABNORMAL LOW (ref 8.9–10.3)
GFR, EST NON AFRICAN AMERICAN: 60 mL/min — AB (ref >60)
Glucose, Bld: 117 mg/dL — ABNORMAL HIGH (ref 65–99)
POTASSIUM: 4.8 mmol/L (ref 3.5–5.1)
SODIUM: 135 mmol/L (ref 135–145)

## 2015-08-25 LAB — CBC
HCT: 30.6 % — ABNORMAL LOW (ref 36.0–46.0)
Hemoglobin: 9.6 g/dL — ABNORMAL LOW (ref 12.0–15.0)
MCH: 24.9 pg — ABNORMAL LOW (ref 26.0–34.0)
MCHC: 31.4 g/dL (ref 30.0–36.0)
MCV: 79.3 fL (ref 78.0–100.0)
PLATELETS: 155 10*3/uL (ref 150–400)
RBC: 3.86 MIL/uL — AB (ref 3.87–5.11)
RDW: 16 % — AB (ref 11.5–15.5)
WBC: 9.9 10*3/uL (ref 4.0–10.5)

## 2015-08-25 LAB — GLUCOSE, CAPILLARY
GLUCOSE-CAPILLARY: 111 mg/dL — AB (ref 65–99)
GLUCOSE-CAPILLARY: 113 mg/dL — AB (ref 65–99)
GLUCOSE-CAPILLARY: 156 mg/dL — AB (ref 65–99)
Glucose-Capillary: 205 mg/dL — ABNORMAL HIGH (ref 65–99)

## 2015-08-25 LAB — MAGNESIUM: Magnesium: 1.1 mg/dL — ABNORMAL LOW (ref 1.7–2.4)

## 2015-08-25 MED ORDER — MAGNESIUM SULFATE 4 GM/100ML IV SOLN
4.0000 g | Freq: Once | INTRAVENOUS | Status: AC
  Administered 2015-08-25: 4 g via INTRAVENOUS
  Filled 2015-08-25: qty 100

## 2015-08-25 MED ORDER — DIAZEPAM 5 MG PO TABS: 5.0000 mg | ORAL_TABLET | Freq: Two times a day (BID) | ORAL | Status: DC | PRN

## 2015-08-25 MED ORDER — DIPHENHYDRAMINE HCL 25 MG PO CAPS
50.0000 mg | ORAL_CAPSULE | Freq: Once | ORAL | Status: AC
  Administered 2015-08-25: 50 mg via ORAL
  Filled 2015-08-25: qty 2

## 2015-08-25 MED ORDER — DIPHENHYDRAMINE HCL 25 MG PO CAPS: 25.0000 mg | ORAL_CAPSULE | ORAL | Status: DC | PRN

## 2015-08-25 NOTE — Progress Notes (Signed)
Attempt made x3 by staff secretary from 1500 to 1700 to schedule follow up appointment for Pt.  Pt states she has follow up appointment with PCP within 2 weeks.  Will have Pt follow up with PCP.

## 2015-08-25 NOTE — Discharge Summary (Signed)
Physician Discharge Summary  Amber [name] RCB:[id number] DOB: 1956-04-26 DOA: 08/24/2015  PCP: [name], MD  Admit date: 08/24/2015 Discharge date: 08/25/2015  Time spent: 65 minutes  Recommendations for Outpatient Follow-up:  1. Follow-up with [name], MD A1 to 2 weeks. On follow-up patient will need a basic metabolic profile done to follow-up on electrolytes and renal function. Patient also need a magnesium level checked.  Discharge Diagnoses:  Principal Problem:   Chest pain Active Problems:   Hypokalemia   History of seizures   DM type 2 causing complication (HCC)   Frequent falls   Essential hypertension   Chest pain, musculoskeletal   Hypomagnesemia   Discharge Condition: Stable and improved  Diet recommendation: Carb modified  Filed Weights   08/24/15 0706  Weight: 66.2 kg (145 lb 15.1 oz)    History of present illness:  Per Dr Keane Police is a 59 y.o. female who presented to the ED with several months of intermittent left-sided chest pain. Pain was worse with deep breathing, worse with palpation of her anterior left chest and worse with certain movements of her left shoulder (lifting her self up, etc). She reported that on the day of admission, she was very angry with a family member, was being rushed to get in to her house, and so fell down. She landed on her left shoulder. Her left shoulder and chest pain became acutely worse after the fall where she landed on the shoulder.  She was still having chest pain in the ED despite 18mg  of morphine.   Hospital Course:  #1 chest pain Patient had presented with intermittent left-sided chest pain ongoing for months which had worsened after a fall. Patient's chest pain was reproducible and felt to be likely secondary to musculoskeletal. Patient was admitted cardiac enzymes were cycled which were essentially negative 3. 2-D echo had no ST-T wave changes/ischemic changes. Patient had a recent 2-D echo done  06/13/2015 that had mild LVH EF of 65% with no wall motion abnormalities. Patient's chest pain improved during the hospitalization and patient be discharged home in stable condition. Patient's chest pain was managed with a chronic pain medications and patient will follow-up with PCP as outpatient.  #2 diabetes mellitus Patient's oral hypoglycemic agents were held. Patient was maintained on sliding scale insulin.  #3 hypertension Patient was maintained on her home regimen.  #4 hypokalemia/hypomagnesemia Patient's potassium was repleted. Magnesium was checked which came back at 1.1. Patient was given a dose of magnesium sulfate 4 g IV 1 prior to discharge this will need to be followed up upon.  #5 history of seizures Remained stable throughout the hospitalization. Patient was maintained on a home regimen of Keppra.  Procedures:  Chest x-ray 08/24/2015  X-ray of the left shoulder 08/24/2015  Consultations:  None  Discharge Exam: Filed Vitals:   08/25/15 0421  BP: 104/71  Pulse: 91  Temp: 98.5 F (36.9 C)  Resp: 18    General: NAD Cardiovascular: RRR Respiratory: CTAB  Discharge Instructions   Discharge Instructions    Diet Carb Modified    Complete by:  As directed      Discharge instructions    Complete by:  As directed   fOLLOW UP WITH SUN,YUN, MD IN 1-2 WEEKS.     Increase activity slowly    Complete by:  As directed           Current Discharge Medication List    CONTINUE these medications which have CHANGED   Details  diazepam (VALIUM) 5  MG tablet Take 1 tablet (5 mg total) by mouth every 12 (twelve) hours as needed for muscle spasms. Qty: 10 tablet, Refills: 0      CONTINUE these medications which have NOT CHANGED   Details  albuterol (PROVENTIL HFA;VENTOLIN HFA) 108 (90 BASE) MCG/ACT inhaler Inhale 2 puffs into the lungs every 6 (six) hours as needed for wheezing or shortness of breath.    allopurinol (ZYLOPRIM) 300 MG tablet Take 300 mg by mouth  daily.    amLODipine (NORVASC) 5 MG tablet Take 5 mg by mouth daily.    aspirin EC 81 MG tablet Take 1 tablet (81 mg total) by mouth daily. Qty: 30 tablet, Refills: 2    atorvastatin (LIPITOR) 10 MG tablet Take 10 mg by mouth daily. Refills: 5    doxycycline (VIBRAMYCIN) 50 MG capsule Take 50 mg by mouth 3 (three) times daily.    esomeprazole (NEXIUM) 40 MG capsule Take 40 mg by mouth daily at 12 noon.    Fluticasone-Salmeterol (ADVAIR) 250-50 MCG/DOSE AEPB Inhale 1 puff into the lungs 2 (two) times daily.    folic acid (FOLVITE) 1 MG tablet Take 1 mg by mouth daily.    furosemide (LASIX) 40 MG tablet Take 1 tablet (40 mg total) by mouth daily. Qty: 30 tablet, Refills: 1    gabapentin (NEURONTIN) 300 MG capsule Take 1 capsule (300 mg total) by mouth 2 (two) times daily. Qty: 60 capsule, Refills: 0    levETIRAcetam (KEPPRA) 500 MG tablet Take 1 tablet (500 mg total) by mouth 2 (two) times daily. Qty: 60 tablet, Refills: 0    LINZESS 145 MCG CAPS capsule Take 145 mcg by mouth daily as needed. Refills: 11    metFORMIN (GLUCOPHAGE-XR) 500 MG 24 hr tablet Take 500 mg by mouth daily with breakfast.    metoprolol succinate (TOPROL-XL) 25 MG 24 hr tablet Take 25 mg by mouth daily.    Oxycodone HCl 20 MG TABS Take 20 mg by mouth every 4 (four) hours.    oxymorphone (OPANA ER) 40 MG 12 hr tablet Take 40 mg by mouth every 12 (twelve) hours.    polyethylene glycol (MIRALAX / GLYCOLAX) packet Take 17 g by mouth 2 (two) times daily. Qty: 14 each, Refills: 0    potassium chloride SA (K-DUR,KLOR-CON) 20 MEQ tablet Take 2 tablets (40 mEq total) by mouth daily. Qty: 30 tablet, Refills: 0    predniSONE (DELTASONE) 5 MG tablet Take 15 mg by mouth daily. Refills: 2    tiZANidine (ZANAFLEX) 4 MG tablet Take 4 mg by mouth 4 (four) times daily. Refills: 2    Vitamin D, Ergocalciferol, (DRISDOL) 50000 UNITS CAPS capsule Take 1 capsule (50,000 Units total) by mouth every 7 (seven) days.  Sunday Qty: 30 capsule, Refills: 0    VITAMIN E PO Take 1 capsule by mouth daily.       Allergies  Allergen Reactions  . Hydromorphone   . Ivp Dye [Iodinated Diagnostic Agents] Itching   Follow-up Information    Follow up with [facility].   Why:  rolling walker   Contact information:   [address] [place] [address] [phone]       Follow up with [facility].   Why:  Registered Nurse, Nurse Aide   Contact information:   [address] [place] [address] [phone]       Follow up with [name], MD. Schedule an appointment as soon as possible for a  visit in 1 week.   Specialty:  Internal Medicine   Contact information:   [address] [place] [address] [phone]        The results of significant diagnostics from this hospitalization (including imaging, microbiology, ancillary and laboratory) are listed below for reference.    Significant Diagnostic Studies: Dg Chest 2 View  08/24/2015   CLINICAL DATA:  Shoulder and left anterior chest wall pain after falling onto left side.  EXAM: CHEST  2 VIEW  COMPARISON:  07/29/2015  FINDINGS: There are intact appearances of the thoracic spinal fixation hardware. No acute fracture is evident. There is no pneumothorax. There is no effusion. Mild chronic lateral costophrenic angle pleural thickening is present on the left. The lungs are clear. Mediastinal contours are normal.  IMPRESSION: No evidence of significant intrathoracic traumatic injury.   Electronically Signed   By: Ellery Plunk M.D.   On: 08/24/2015 02:07   Ct Head Wo Contrast  07/26/2015   CLINICAL DATA:  59 year old female with fall  EXAM: CT HEAD WITHOUT CONTRAST  TECHNIQUE: Contiguous axial images were obtained from the base of the skull through the vertex without intravenous contrast.  COMPARISON:  None.  FINDINGS: The ventricles and sulci are appropriate in size for the patient's age. Mild  periventricular and deep white matter hypodensities represent chronic microvascular ischemic changes. There is no intracranial hemorrhage. No mass effect or midline shift identified.  The visualized paranasal sinuses and mastoid air cells are well aerated. The calvarium is intact.  IMPRESSION: No acute intracranial pathology.  Mild chronic microvascular ischemic disease.   Electronically Signed   By: Elgie Collard M.D.   On: 07/26/2015 22:20   Ct Abdomen Pelvis W Contrast  07/31/2015   CLINICAL DATA:  Abdominal and epigastric pain.  EXAM: CT ABDOMEN AND PELVIS WITH CONTRAST  TECHNIQUE: Multidetector CT imaging of the abdomen and pelvis was performed using the standard protocol following bolus administration of intravenous contrast.  CONTRAST:  OMNIPAQUE IOHEXOL 300 MG/ML  SOLN  COMPARISON:  06/03/2014  FINDINGS: Lower chest and abdominal wall: There reticular opacities in the right more than left lungs, correlating with chart history of pneumonia. No indication of progression.  Hepatobiliary: No focal liver abnormality.Cholecystectomy. No new bile duct enlargement or calcified stone. There is chronic focal segmental dilatation of lateral left liver ducts without progression.  Pancreas: Chronic enlargement of the distal main duct to 5 mm, with chronic diffuse pancreatic parenchymal atrophy. Calcifications present within the tail. Pancreatic stent seen on the previous study is no longer present. There is no evidence of active inflammation or progressive ductal distention. No gross mass lesion.  Spleen: Negative  Adrenals/Urinary Tract: Negative adrenals. No hydronephrosis or stone. Unremarkable bladder.  Reproductive:No pathologic findings.  Stomach/Bowel: No obstruction. No appendicitis. Distal colonic and duodenal diverticulosis.  Vascular/Lymphatic: No acute vascular abnormality. No mass or adenopathy.  Peritoneal: No ascites or pneumoperitoneum.  Musculoskeletal: No acute abnormalities. Remote T12  inferior endplate fracture. Stable lower lumbar facet arthropathy with grade 1 L4-5 anterolisthesis. Visualized portions of thoracic posterior fixation are unremarkable.  IMPRESSION: 1. No explanation for acute abdominal pain. 2. Prior pancreatitis without active inflammation. Stable biliary and pancreatic ductal enlargement compared to 2015. 3. Known bilateral pneumonia.   Electronically Signed   By: Marnee Spring M.D.   On: 07/31/2015 15:20   Nm Pulmonary Perf And Vent  07/27/2015   CLINICAL DATA:  Chest pain and shortness of breath with elevated D-dimer  EXAM: NUCLEAR MEDICINE VENTILATION -  PERFUSION LUNG SCAN  TECHNIQUE: Ventilation images were obtained in multiple projections using inhaled aerosol Tc-72m DTPA. Perfusion images were obtained in multiple projections after intravenous injection of Tc-74m MAA.  RADIOPHARMACEUTICALS:  41 mCi Technetium-63m DTPA aerosol inhalation and 6.2 mCi Technetium-34m MAA IV  COMPARISON:  06/12/2015  FINDINGS: Ventilation: Poor uptake is noted within both lungs although worse on the right than the left. This is likely related to the airspace disease seen on recent chest x-ray.  Perfusion: No wedge shaped peripheral perfusion defects to suggest acute pulmonary embolism. The appearance of the perfusion imaging is stable from the previous exam.  IMPRESSION: No perfusion defect to suggest pulmonary embolism.  Decreased ventilation within the right lung consistent with diffuse infiltrates seen on recent examination.   Electronically Signed   By: Alcide Clever M.D.   On: 07/27/2015 17:11   Dg Chest Port 1 View  07/29/2015   CLINICAL DATA:  Pneumonia.  Shortness of breath.  EXAM: PORTABLE CHEST - 1 VIEW  COMPARISON:  07/26/2015  FINDINGS: Shallow inspiration. Heart size and pulmonary vascularity are likely normal for inspiratory effort. Patchy infiltrative changes in both lungs, greater on the right. This could represent pneumonia or edema. Old right rib fractures. Postoperative  changes in the thoracic spine.  IMPRESSION: Bilateral patchy airspace disease, greater on the right. No significant change since prior study peer   Electronically Signed   By: Burman Nieves M.D.   On: 07/29/2015 04:32   Dg Chest Portable 1 View  07/26/2015   CLINICAL DATA:  Short of breath.  Recent fall.  Chest pain  EXAM: PORTABLE CHEST - 1 VIEW  COMPARISON:  06/11/2015  FINDINGS: Cardiac enlargement. Right upper lobe and right lower lobe airspace disease may represent pneumonia or asymmetric edema. Small left pleural effusion. Mild left lower lobe airspace disease, probably atelectasis.  Thoracic fusion with hardware unchanged.  IMPRESSION: Asymmetric airspace disease right greater than left. This may represent pneumonia. Edema also possible. There appears to be underlying chronic lung disease.   Electronically Signed   By: Marlan Palau M.D.   On: 07/26/2015 20:21   Dg Shoulder Left  08/24/2015   CLINICAL DATA:  Left shoulder pain after falling on the left side.  EXAM: LEFT SHOULDER - 2+ VIEW  COMPARISON:  None.  FINDINGS: Negative for acute fracture, dislocation or radiopaque foreign body. There is old fragmentation of the distal clavicle, probably sequela from remote trauma. AC joint is intact. Glenohumeral articulation is intact.  IMPRESSION: Negative for acute fracture.   Electronically Signed   By: Ellery Plunk M.D.   On: 08/24/2015 02:08    Microbiology: No results found for this or any previous visit (from the past 240 hour(s)).   Labs: Basic Metabolic Panel:  Recent Labs Lab 08/24/15 0223 08/25/15 0934  NA 139 135  K 2.6* 4.8  CL 99* 101  CO2 24 23  GLUCOSE 167* 117*  BUN 11 13  CREATININE 1.05* 1.02*  CALCIUM 8.4* 8.2*  MG  --  1.1*   Liver Function Tests:  Recent Labs Lab 08/24/15 0223  AST 27  ALT 18  ALKPHOS 90  BILITOT 0.4  PROT 6.9  ALBUMIN 3.1*    Recent Labs Lab 08/24/15 0223  LIPASE 20*   No results for input(s): AMMONIA in the last 168  hours. CBC:  Recent Labs Lab 08/24/15 0223 08/25/15 0934  WBC 7.5 9.9  NEUTROABS 5.4  --   HGB 9.6* 9.6*  HCT 29.5* 30.6*  MCV 77.2*  79.3  PLT 207 155   Cardiac Enzymes:  Recent Labs Lab 08/24/15 0432 08/24/15 1122 08/24/15 1840  TROPONINI 0.03 0.04* 0.03   BNP: BNP (last 3 results)  Recent Labs  06/11/15 2255 07/26/15 1931 07/27/15 0150  BNP 51.1 37.8 41.3    ProBNP (last 3 results) No results for input(s): PROBNP in the last 8760 hours.  CBG:  Recent Labs Lab 08/24/15 2015 08/24/15 2340 08/25/15 0416 08/25/15 0756 08/25/15 1127  GLUCAP 134* 147* 111* 113* 156*       Signed:  THOMPSON,DANIEL MD [facility] 08/25/2015, 2:42 PM

## 2015-08-25 NOTE — Progress Notes (Signed)
Magnesium IV completed.  Pt discharge education given with son at bedside.  Pt indicates understanding.  Pt denies chest pain or sob.  VS stable.  IV removed.  Telebox removed.  Pt discharged home with son.  Pt assisted to vehicle by RN and staff.  Pt belongings including rollator with Pt.  No s/s of distress.

## 2015-09-07 ENCOUNTER — Other Ambulatory Visit: Payer: Self-pay

## 2015-09-07 DIAGNOSIS — Z1231 Encounter for screening mammogram for malignant neoplasm of breast: Secondary | ICD-10-CM

## 2015-09-11 IMAGING — CT CT HEAD W/O CM
3 series · 17 of 30 positions shown, 19 images · non-contrast
Comparison: None.

CLINICAL DATA: 59-year-old female with fall

EXAM:
CT HEAD WITHOUT CONTRAST
TECHNIQUE: Contiguous axial images were obtained from the base of the skull
through the vertex without intravenous contrast.

[Series 2: head 4.8 h37s · axial · 0.43mm/px · z∈[-106,-66]mm · 3 of 16 slices shown (1 of 2)]
[im 4/16  brain]
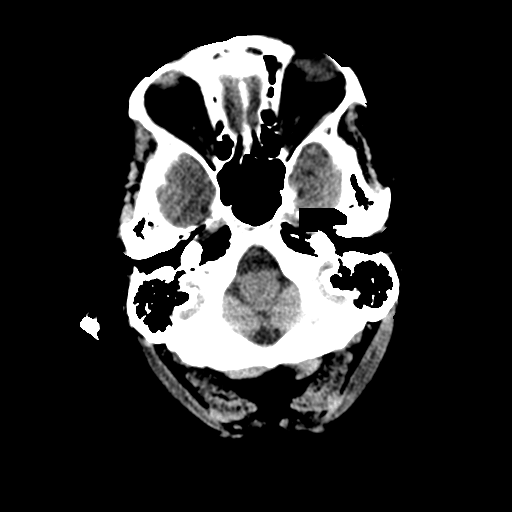
[im 8/16  brain]
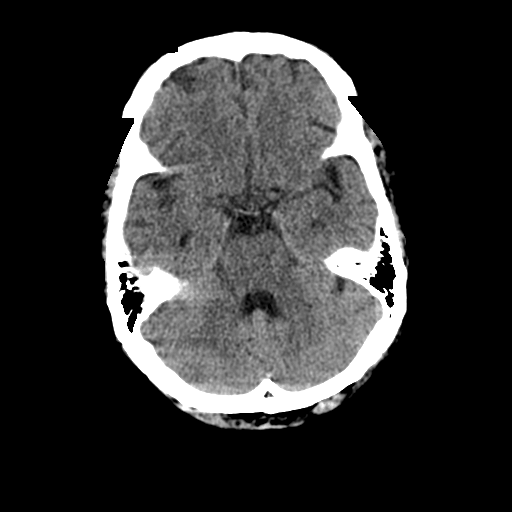
[im 12/16  brain]
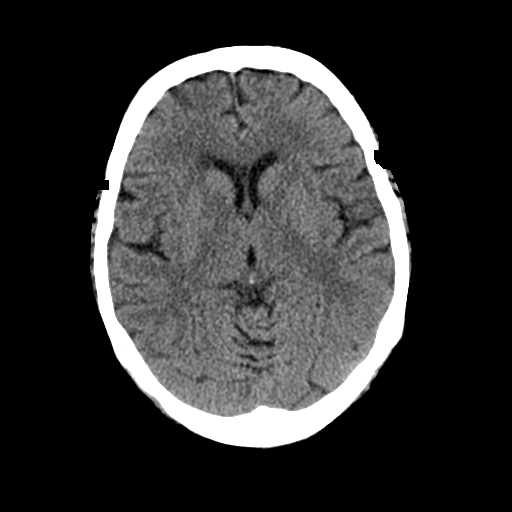

[Series 4: head 4.8 h37s · axial · 0.43mm/px · z∈[-105,-5]mm · 6 of 28 slices shown, 8 images (2 of 2)]
[im 4/28  brain]
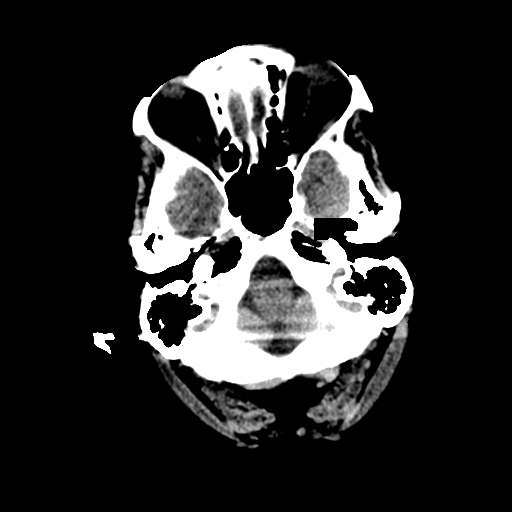
[im 4/28  bone]
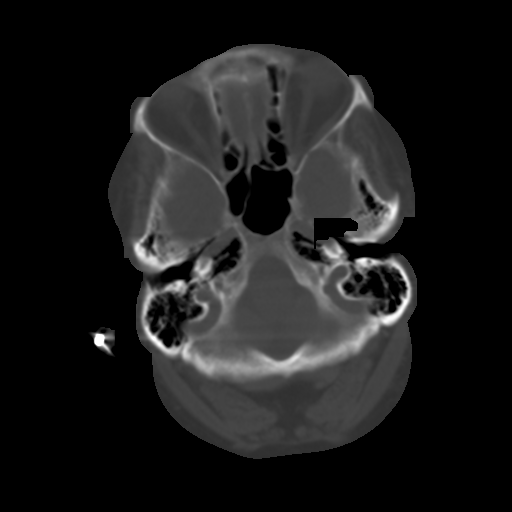
[im 8/28  brain]
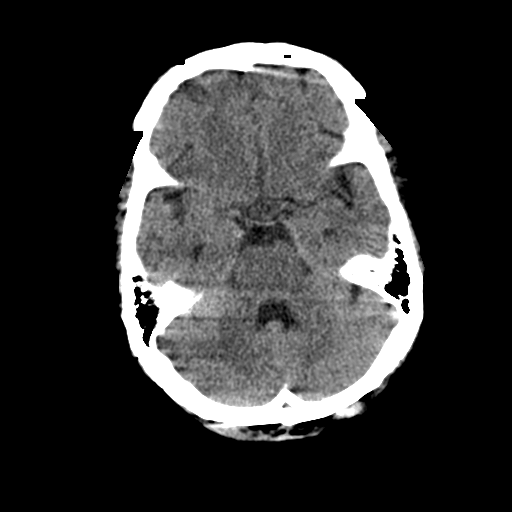
[im 12/28  brain]
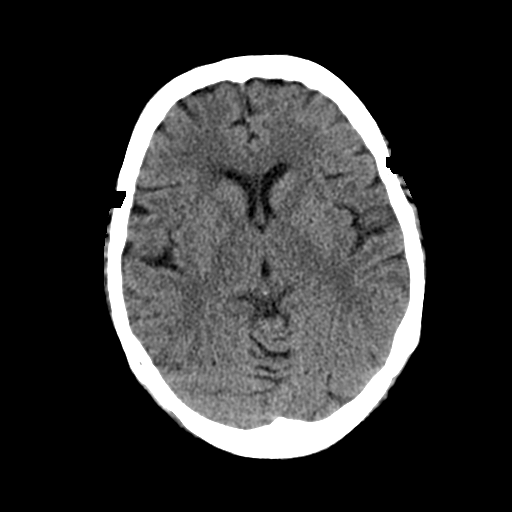
[im 16/28  brain]
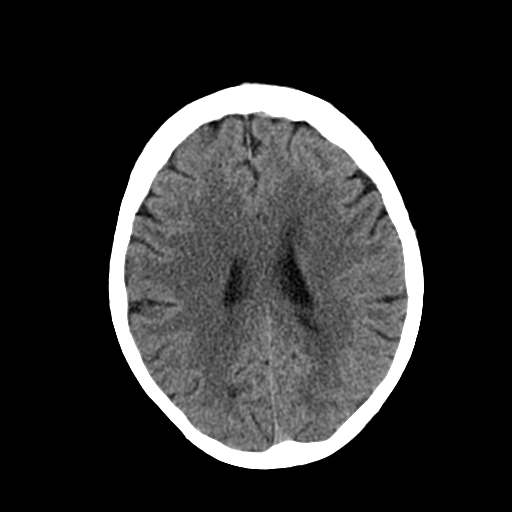
[im 20/28  brain]
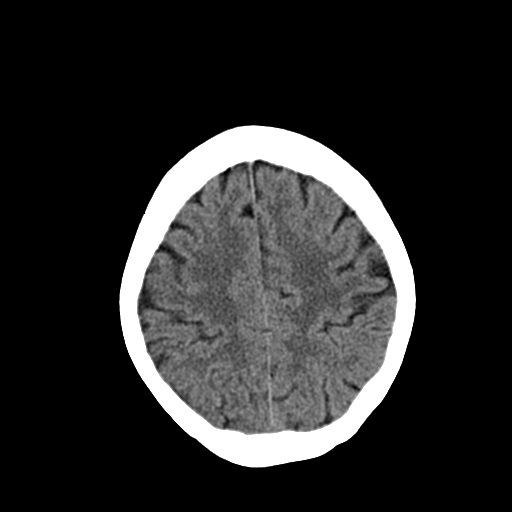
[im 20/28  bone]
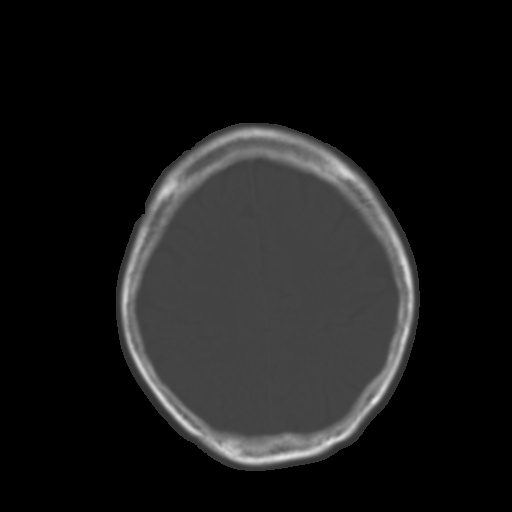
[im 24/28  brain]
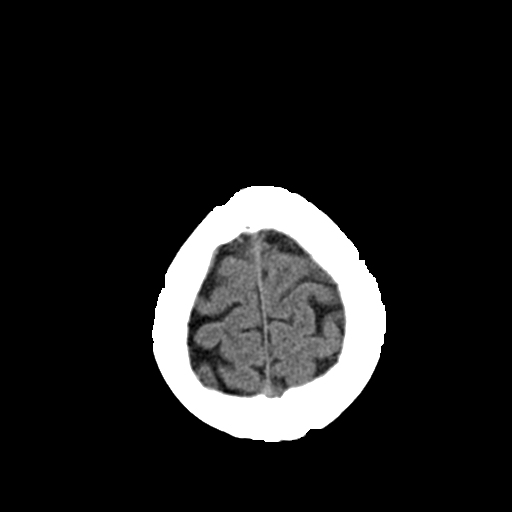

[Series 5: head 2.4 h60s bone · axial · 0.43mm/px · z∈[-114,+6]mm · 8 of 56 slices shown]
[im 4/56  bone]
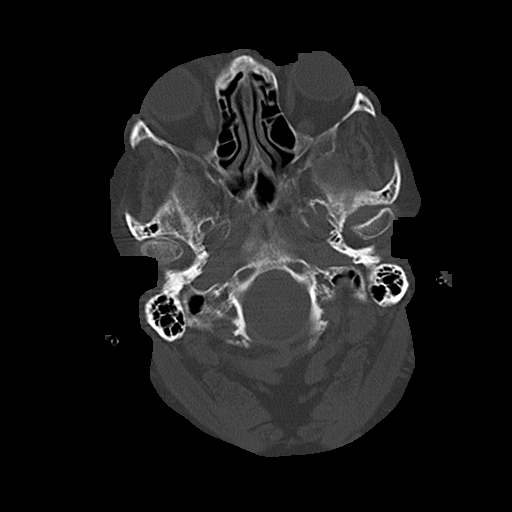
[im 11/56  bone]
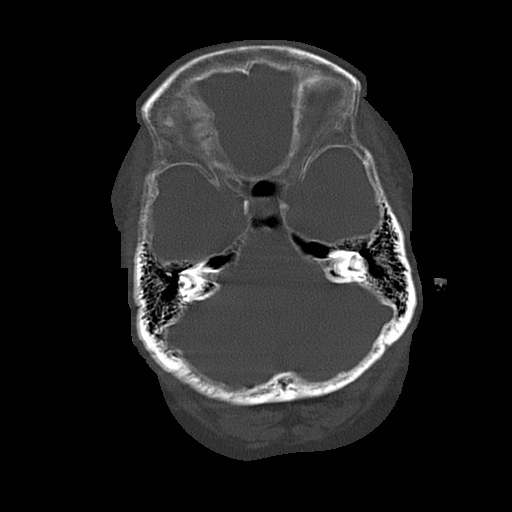
[im 18/56  bone]
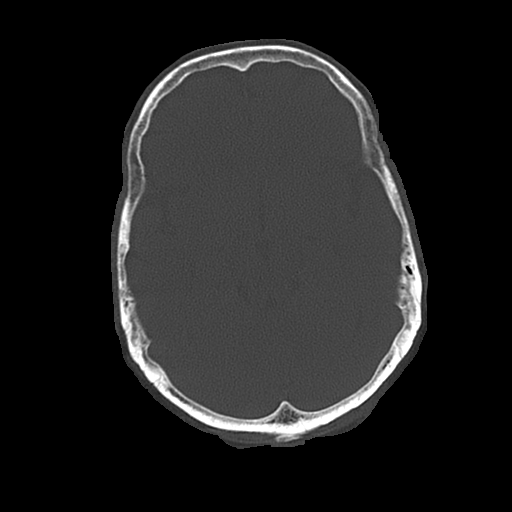
[im 25/56  bone]
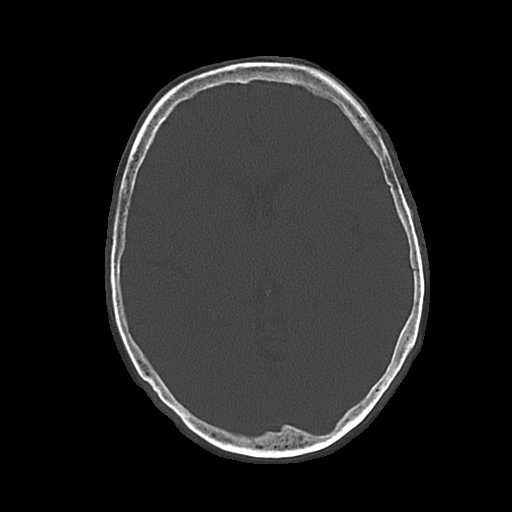
[im 31/56  bone]
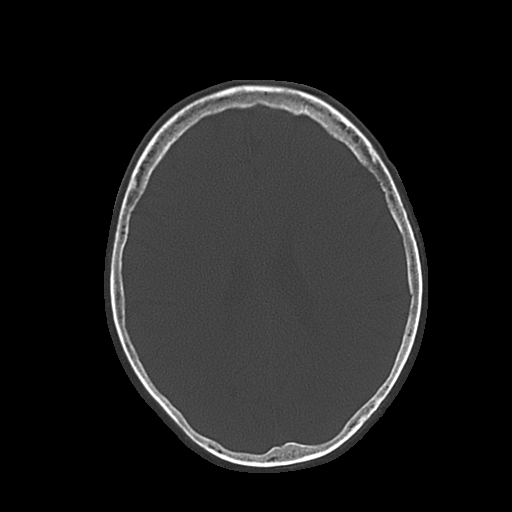
[im 38/56  bone]
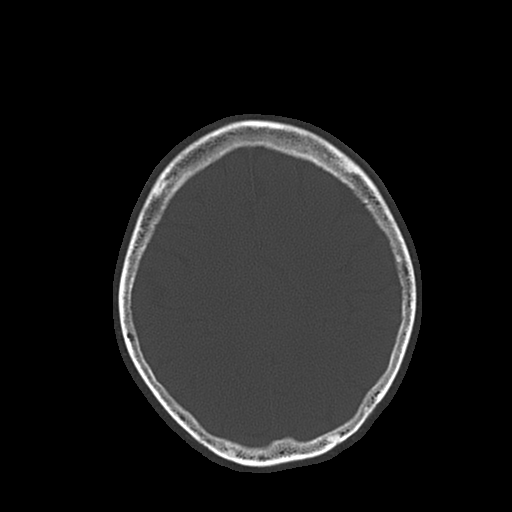
[im 45/56  bone]
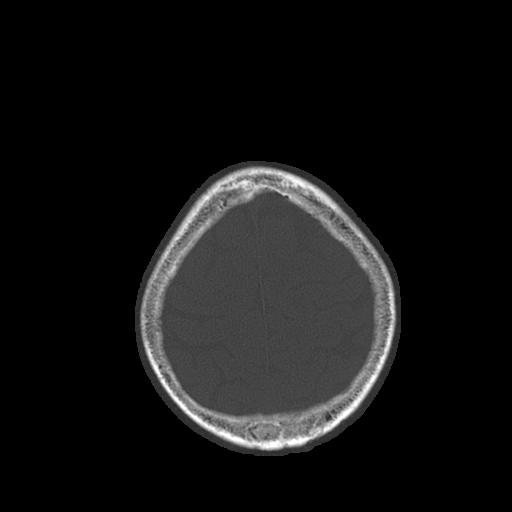
[im 52/56  bone]
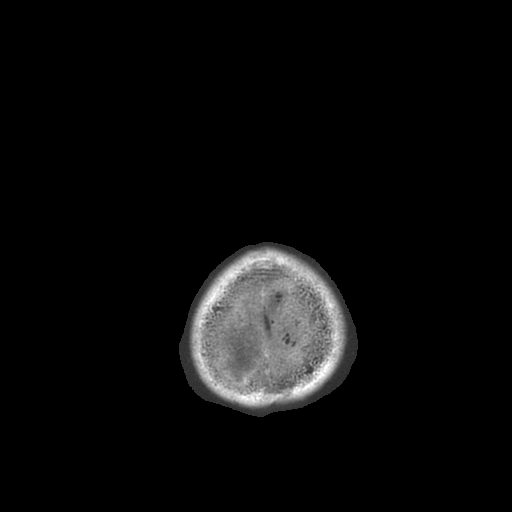

[17 of 30 positions shown; findings below may reference images not displayed]

FINDINGS: The ventricles and sulci are appropriate in size for the patient's
age. Mild periventricular and deep white matter hypodensities
represent chronic microvascular ischemic changes. There is no
intracranial hemorrhage. No mass effect or midline shift identified.

The visualized paranasal sinuses and mastoid air cells are well
aerated. The calvarium is intact.
IMPRESSION: No acute intracranial pathology.

Mild chronic microvascular ischemic disease.

## 2015-09-12 IMAGING — NM NM PULMONARY VENT & PERF
16 series · 16 of 16 positions shown · non-contrast
Comparison: 06/12/2015

CLINICAL DATA: Chest pain and shortness of breath with elevated
D-dimer

EXAM:
NUCLEAR MEDICINE VENTILATION - PERFUSION LUNG SCAN
TECHNIQUE: Ventilation images were obtained in multiple projections using
inhaled aerosol Bc-WWm DTPA. Perfusion images were obtained in
multiple projections after intravenous injection of Bc-WWm MAA.
RADIOPHARMACEUTICALS:  41 mCi Qechnetium-11m DTPA aerosol inhalation
and 6.2 mCi Qechnetium-11m MAA IV

[Series 1: ant/post vent · 4.14mm/px · 1 of 1 slices shown (1 of 2)]
[im 1/1]
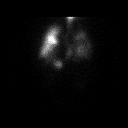

[Series 1: ant/post vent · 4.14mm/px · 1 of 1 slices shown (2 of 2)]
[im 1/1]
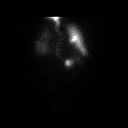

[Series 2: lao/rpo vent · 4.14mm/px · 1 of 1 slices shown (1 of 2)]
[im 1/1]
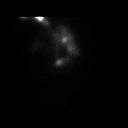

[Series 2: lao/rpo vent · 4.14mm/px · 1 of 1 slices shown (2 of 2)]
[im 1/1]
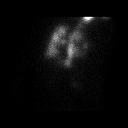

[Series 3: lpo/rao vent · 4.14mm/px · 1 of 1 slices shown (1 of 2)]
[im 1/1]
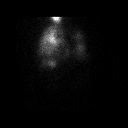

[Series 3: lpo/rao vent · 4.14mm/px · 1 of 1 slices shown (2 of 2)]
[im 1/1]
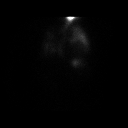

[Series 4: lt lat/rt lat vent · 4.14mm/px · 1 of 1 slices shown (1 of 2)]
[im 1/1]
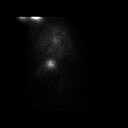

[Series 4: lt lat/rt lat vent · 4.14mm/px · 1 of 1 slices shown (2 of 2)]
[im 1/1]
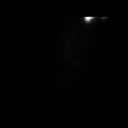

[Series 5: lt lat/rt lat perf · 4.14mm/px · 1 of 1 slices shown (1 of 2)]
[im 1/1]
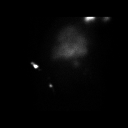

[Series 5: lt lat/rt lat perf · 4.14mm/px · 1 of 1 slices shown (2 of 2)]
[im 1/1]
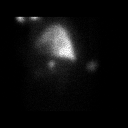

[Series 6: lpo/rao perf · 4.14mm/px · 1 of 1 slices shown (1 of 2)]
[im 1/1]
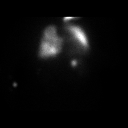

[Series 6: lpo/rao perf · 4.14mm/px · 1 of 1 slices shown (2 of 2)]
[im 1/1]
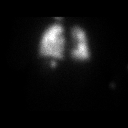

[Series 7: ant/post perf · 4.14mm/px · 1 of 1 slices shown (1 of 2)]
[im 1/1]
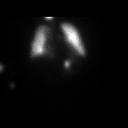

[Series 7: ant/post perf · 4.14mm/px · 1 of 1 slices shown (2 of 2)]
[im 1/1]
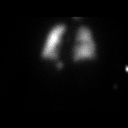

[Series 8: lao/rpo perf · 4.14mm/px · 1 of 1 slices shown (1 of 2)]
[im 1/1]
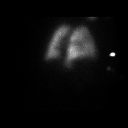

[Series 8: lao/rpo perf · 4.14mm/px · 1 of 1 slices shown (2 of 2)]
[im 1/1]
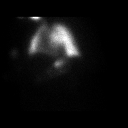

[16 of 16 positions shown; findings below may reference images not displayed]

FINDINGS: Ventilation: Poor uptake is noted within both lungs although worse
on the right than the left. This is likely related to the airspace
disease seen on recent chest x-ray.

Perfusion: No wedge shaped peripheral perfusion defects to suggest
acute pulmonary embolism. The appearance of the perfusion imaging is
stable from the previous exam.
IMPRESSION: No perfusion defect to suggest pulmonary embolism.

Decreased ventilation within the right lung consistent with diffuse
infiltrates seen on recent examination.

## 2015-09-14 IMAGING — CR DG CHEST 1V PORT
1 series · 1 of 1 positions shown · non-contrast
Comparison: 07/26/2015

CLINICAL DATA: Pneumonia.  Shortness of breath.

EXAM:
PORTABLE CHEST - 1 VIEW

[AP]
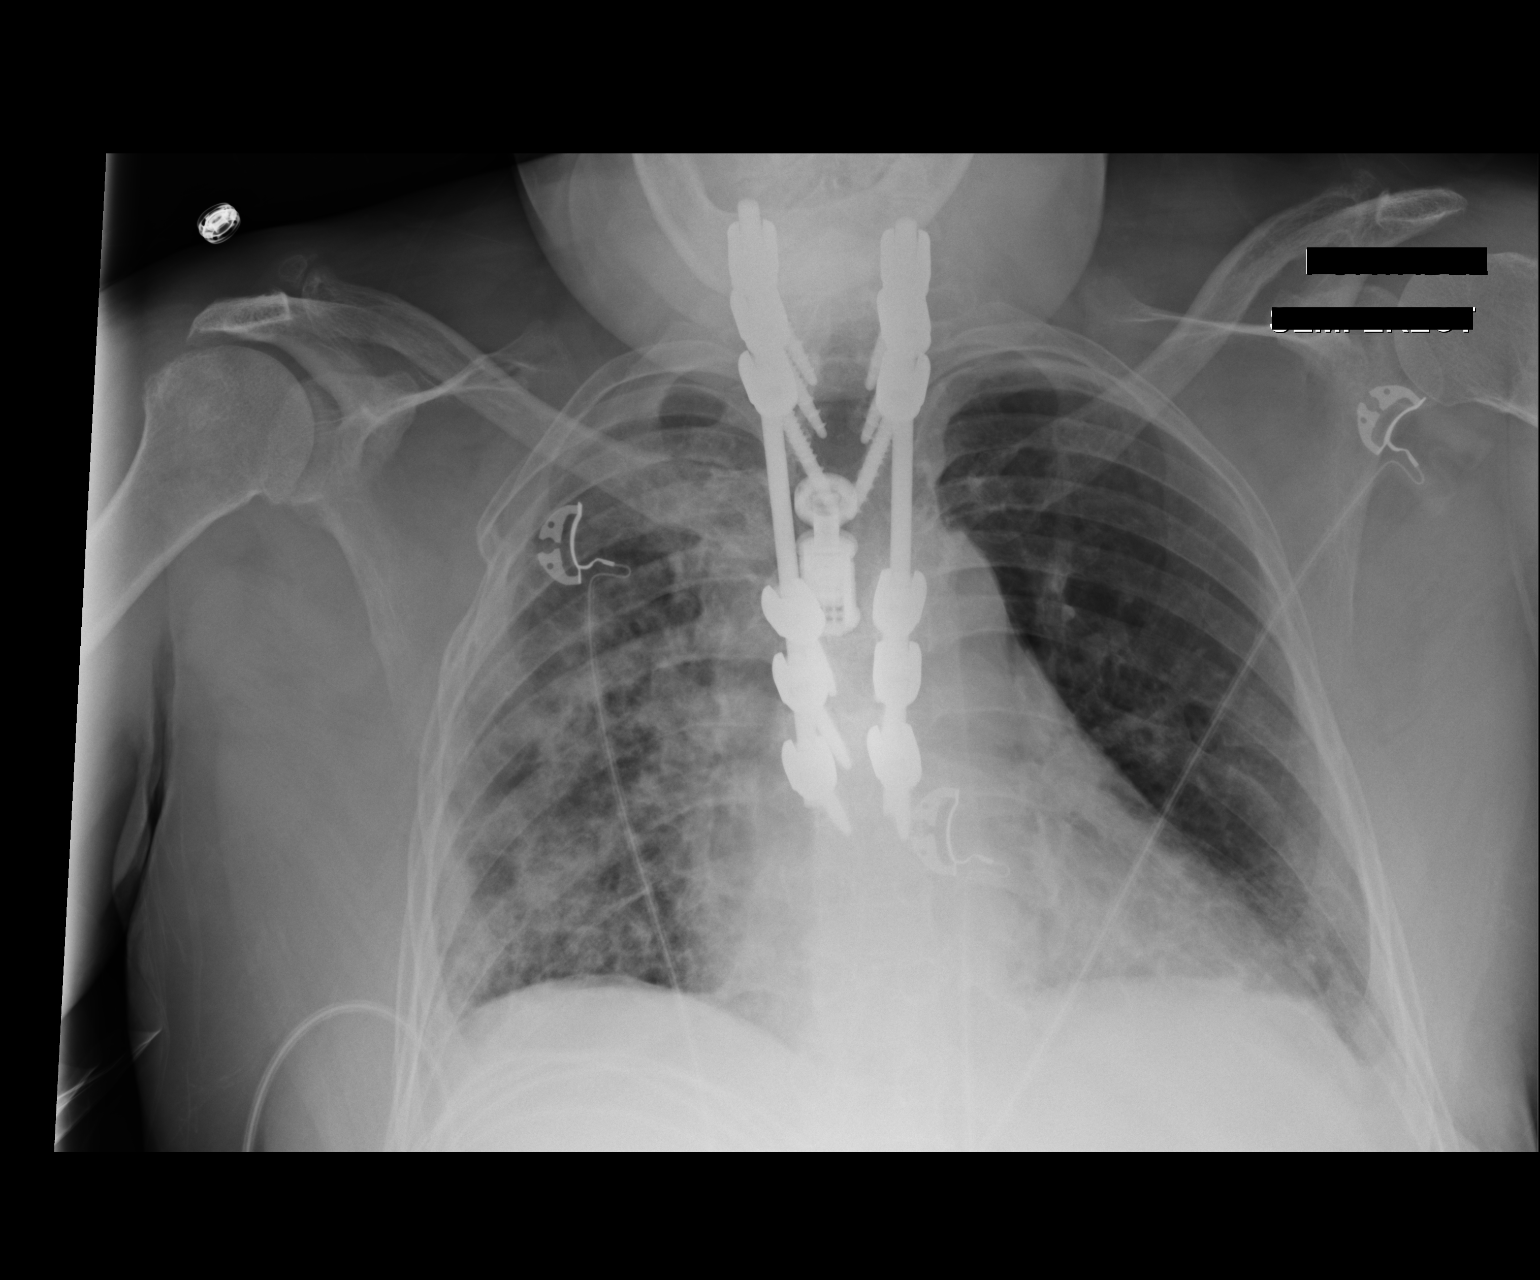

[1 of 1 positions shown; findings below may reference images not displayed]

FINDINGS: Shallow inspiration. Heart size and pulmonary vascularity are likely
normal for inspiratory effort. Patchy infiltrative changes in both
lungs, greater on the right. This could represent pneumonia or
edema. Old right rib fractures. Postoperative changes in the
thoracic spine.
IMPRESSION: Bilateral patchy airspace disease, greater on the right. No
significant change since prior study peer

## 2015-09-16 IMAGING — CT CT ABD-PELV W/ CM
2 of 5 series · 16 of 46 positions shown, 18 images · IV contrast (Omni 300)
Comparison: 06/03/2014

CLINICAL DATA: Abdominal and epigastric pain.

EXAM:
CT ABDOMEN AND PELVIS WITH CONTRAST
TECHNIQUE: Multidetector CT imaging of the abdomen and pelvis was performed
using the standard protocol following bolus administration of
intravenous contrast.
CONTRAST:  100mL OMNIPAQUE IOHEXOL 300 MG/ML  SOLN

[Series 2: abd/ pelvis 5.0 i30f 1 · axial · 0.94mm/px · z∈[-1040,-634]mm · 13 of 91 slices shown, 15 images]
[im 5/91  soft-tissue]
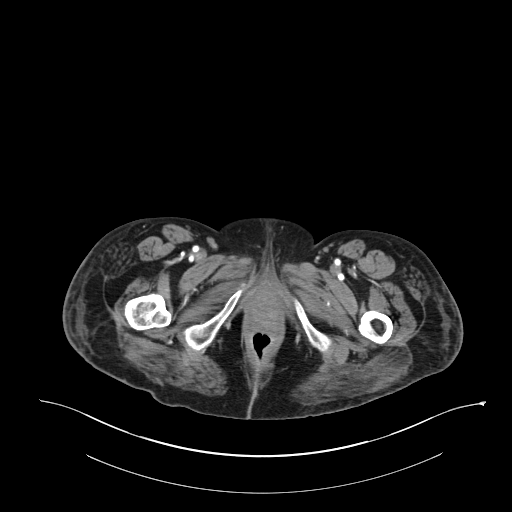
[im 5/91  bone]
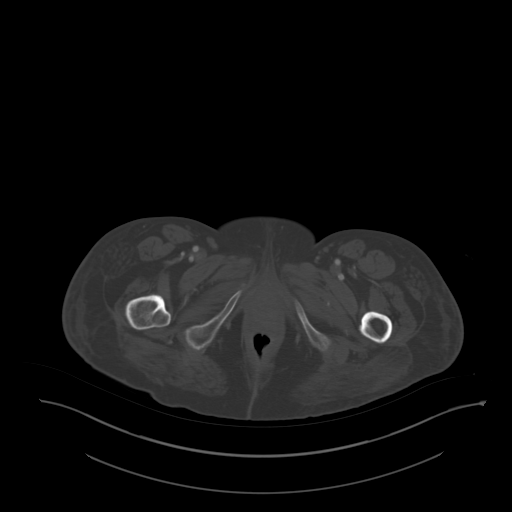
[im 14/91  soft-tissue]
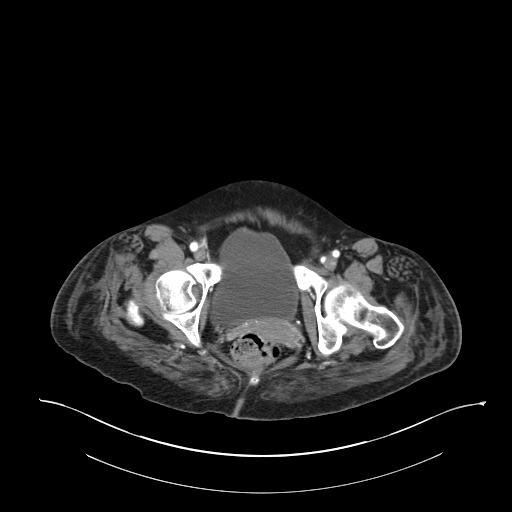
[im 19/91  soft-tissue]
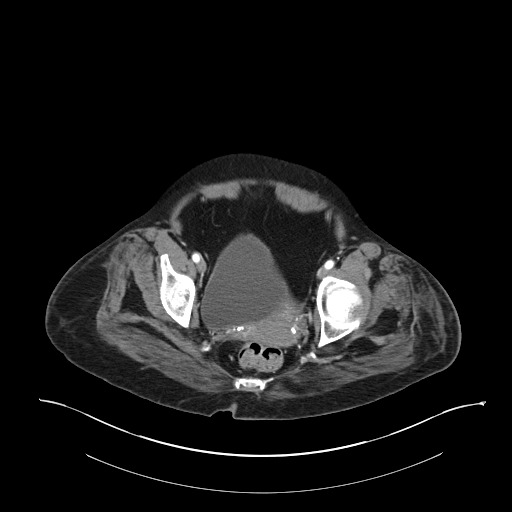
[im 28/91  soft-tissue]
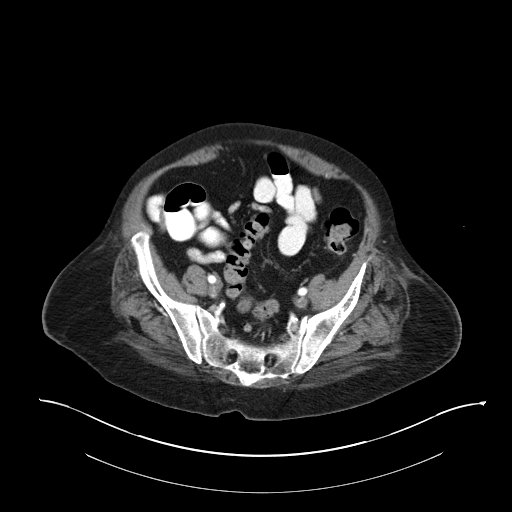
[im 32/91  soft-tissue]
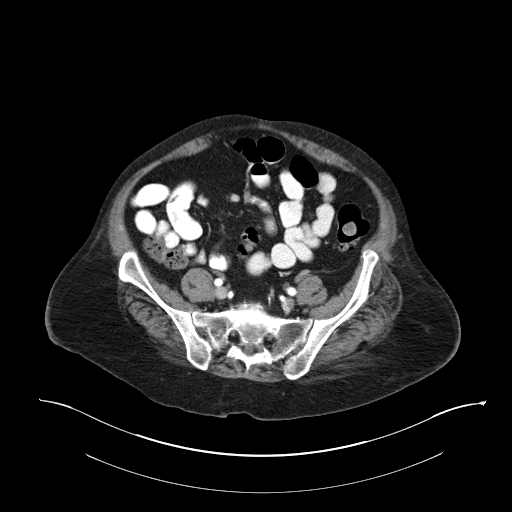
[im 41/91  soft-tissue]
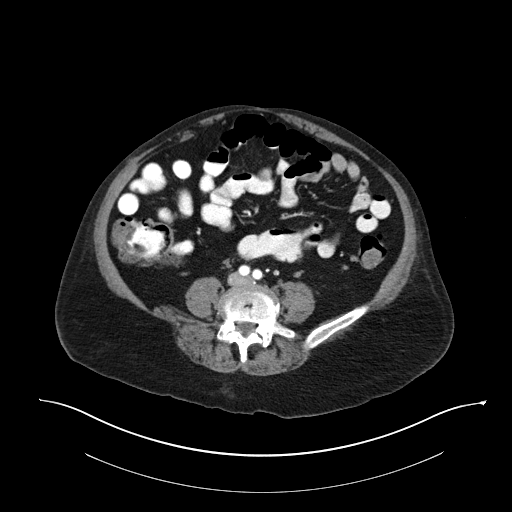
[im 46/91  soft-tissue]
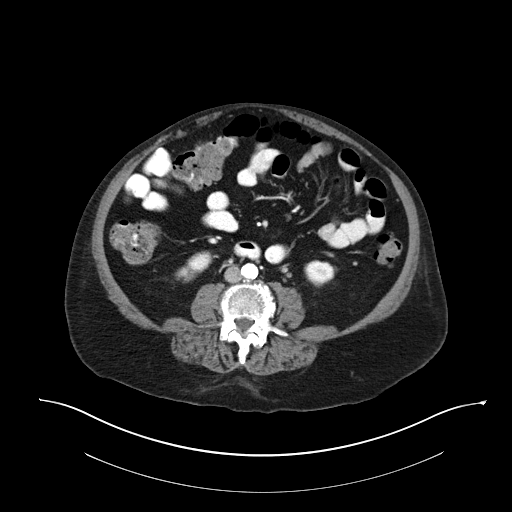
[im 50/91  soft-tissue]
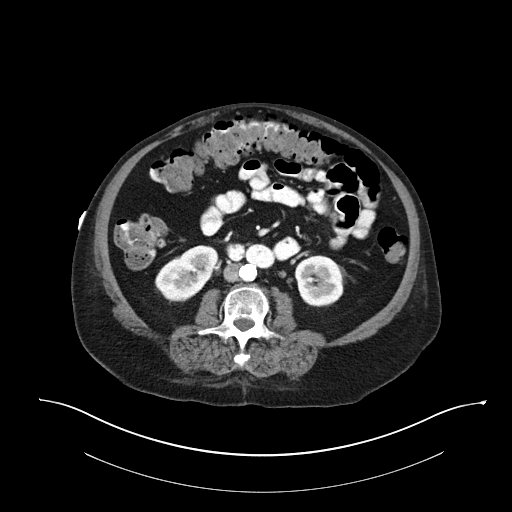
[im 59/91  soft-tissue]
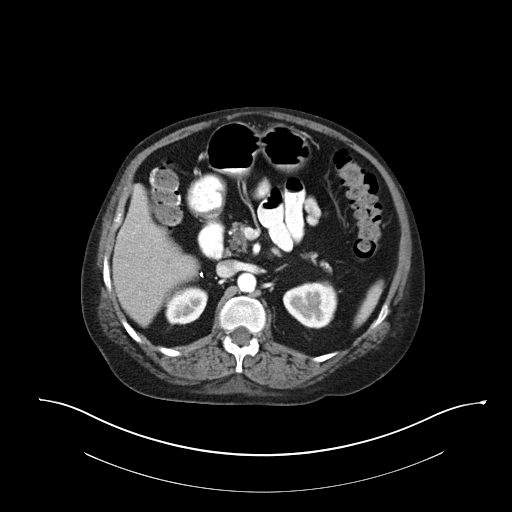
[im 59/91  bone]
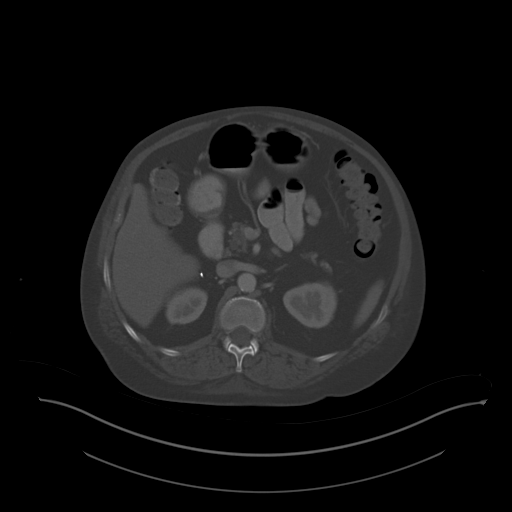
[im 64/91  soft-tissue]
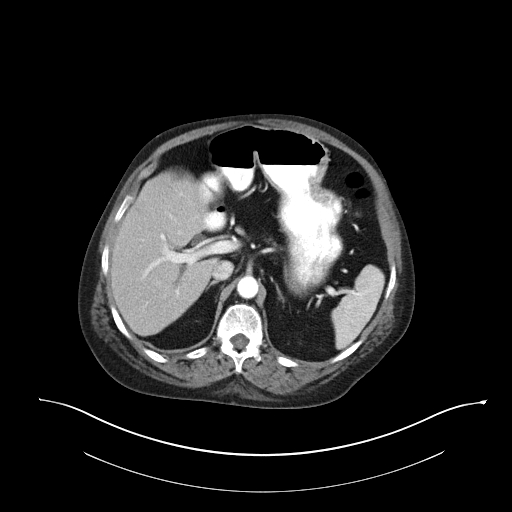
[im 73/91  soft-tissue]
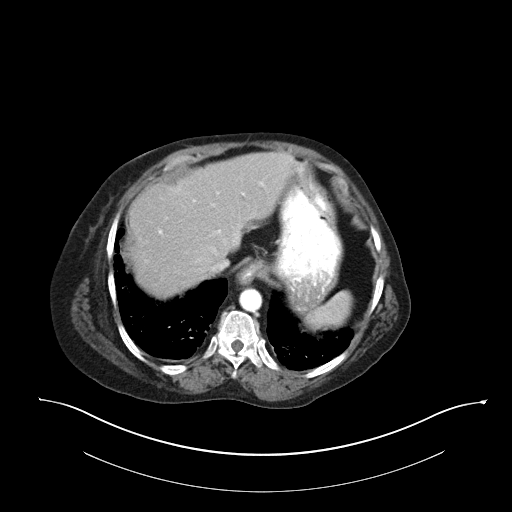
[im 77/91  soft-tissue]
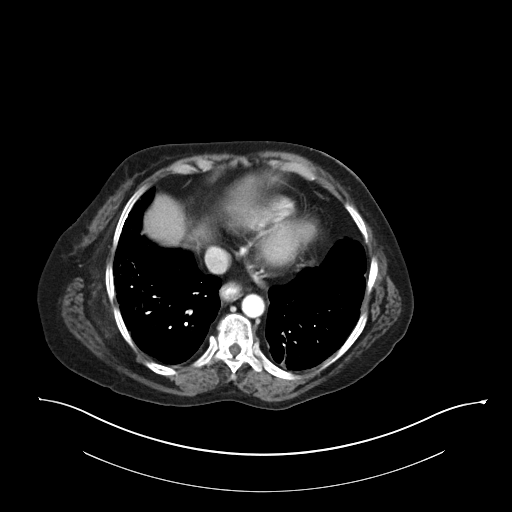
[im 86/91  soft-tissue]
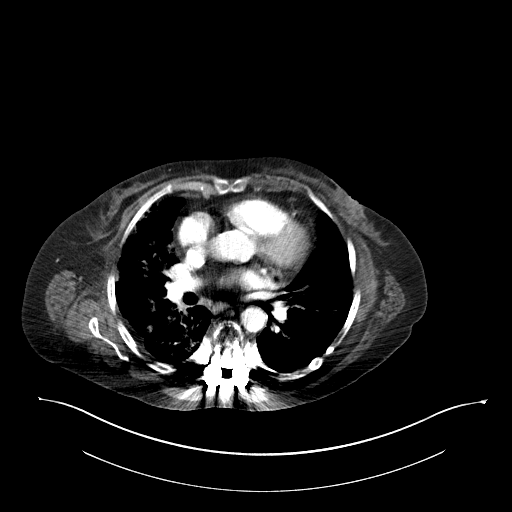

[Series 5: coronals · coronal · 0.66mm/px · 3 of 152 slices shown]
[im 51/152  soft-tissue]
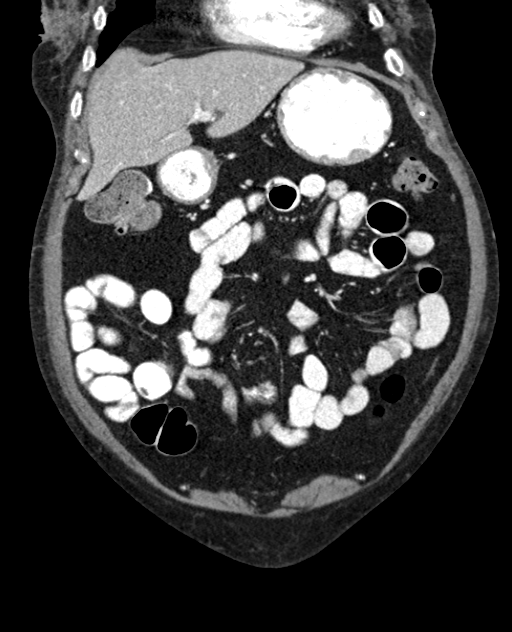
[im 68/152  soft-tissue]
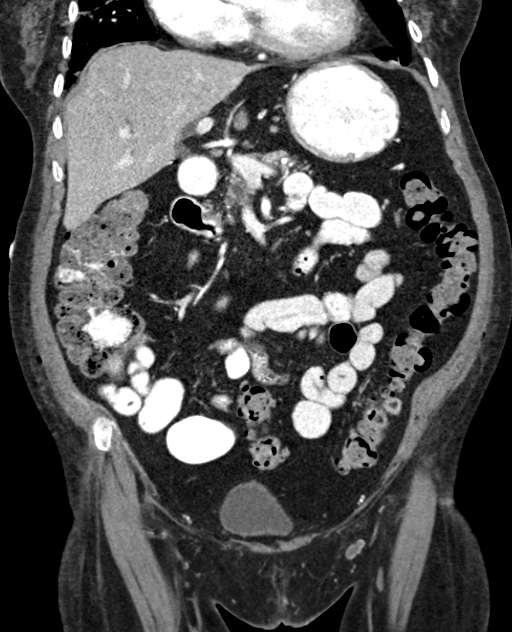
[im 84/152  soft-tissue]
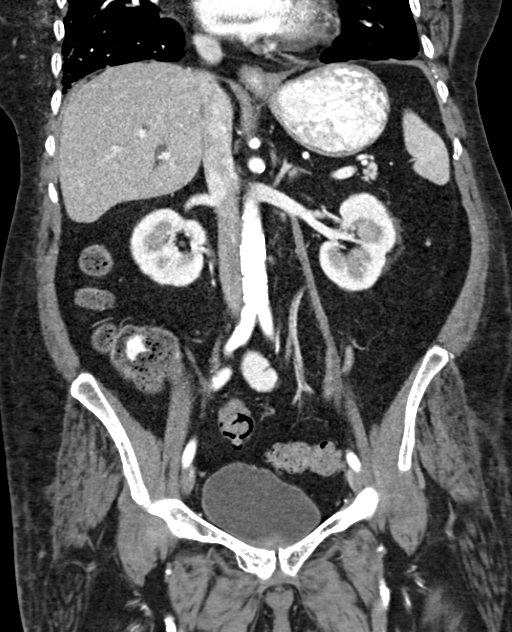

[16 of 46 positions shown; findings below may reference images not displayed]

FINDINGS: Lower chest and abdominal wall: There reticular opacities in the
right more than left lungs, correlating with chart history of
pneumonia. No indication of progression.

Hepatobiliary: No focal liver abnormality.Cholecystectomy. No new
bile duct enlargement or calcified stone. There is chronic focal
segmental dilatation of lateral left liver ducts without
progression.

Pancreas: Chronic enlargement of the distal main duct to 5 mm, with
chronic diffuse pancreatic parenchymal atrophy. Calcifications
present within the tail. Pancreatic stent seen on the previous study
is no longer present. There is no evidence of active inflammation or
progressive ductal distention. No gross mass lesion.

Spleen: Negative

Adrenals/Urinary Tract: Negative adrenals. No hydronephrosis or
stone. Unremarkable bladder.

Reproductive:No pathologic findings.

Stomach/Bowel: No obstruction. No appendicitis. Distal colonic and
duodenal diverticulosis.

Vascular/Lymphatic: No acute vascular abnormality. No mass or
adenopathy.

Peritoneal: No ascites or pneumoperitoneum.

Musculoskeletal: No acute abnormalities. Remote T12 inferior
endplate fracture. Stable lower lumbar facet arthropathy with grade
1 L4-5 anterolisthesis. Visualized portions of thoracic posterior
fixation are unremarkable.
IMPRESSION: 1. No explanation for acute abdominal pain.
2. Prior pancreatitis without active inflammation. Stable biliary
and pancreatic ductal enlargement compared to 3308.
3. Known bilateral pneumonia.

## 2015-09-20 ENCOUNTER — Ambulatory Visit: Payer: Medicaid Other

## 2015-09-27 ENCOUNTER — Emergency Department (HOSPITAL_COMMUNITY)
Admission: EM | Admit: 2015-09-27 | Discharge: 2015-09-27 | Disposition: A | Discharge status: Home / Self Care | Payer: Medicaid Other | Attending: Emergency Medicine | Admitting: Emergency Medicine

## 2015-09-27 ENCOUNTER — Encounter (HOSPITAL_COMMUNITY): Payer: Self-pay | Admitting: Emergency Medicine

## 2015-09-27 ENCOUNTER — Emergency Department (HOSPITAL_COMMUNITY): Payer: Medicaid Other

## 2015-09-27 DIAGNOSIS — R079 Chest pain, unspecified: Secondary | ICD-10-CM | POA: Diagnosis not present

## 2015-09-27 DIAGNOSIS — R197 Diarrhea, unspecified: Secondary | ICD-10-CM | POA: Diagnosis not present

## 2015-09-27 DIAGNOSIS — K219 Gastro-esophageal reflux disease without esophagitis: Secondary | ICD-10-CM | POA: Diagnosis not present

## 2015-09-27 DIAGNOSIS — I10 Essential (primary) hypertension: Secondary | ICD-10-CM | POA: Insufficient documentation

## 2015-09-27 DIAGNOSIS — M549 Dorsalgia, unspecified: Secondary | ICD-10-CM | POA: Diagnosis not present

## 2015-09-27 DIAGNOSIS — G8929 Other chronic pain: Secondary | ICD-10-CM | POA: Insufficient documentation

## 2015-09-27 DIAGNOSIS — Z7952 Long term (current) use of systemic steroids: Secondary | ICD-10-CM | POA: Insufficient documentation

## 2015-09-27 DIAGNOSIS — Z7984 Long term (current) use of oral hypoglycemic drugs: Secondary | ICD-10-CM | POA: Diagnosis not present

## 2015-09-27 DIAGNOSIS — Z7951 Long term (current) use of inhaled steroids: Secondary | ICD-10-CM | POA: Insufficient documentation

## 2015-09-27 DIAGNOSIS — K5900 Constipation, unspecified: Secondary | ICD-10-CM | POA: Insufficient documentation

## 2015-09-27 DIAGNOSIS — M25569 Pain in unspecified knee: Secondary | ICD-10-CM | POA: Diagnosis not present

## 2015-09-27 DIAGNOSIS — Z792 Long term (current) use of antibiotics: Secondary | ICD-10-CM | POA: Insufficient documentation

## 2015-09-27 DIAGNOSIS — R1013 Epigastric pain: Secondary | ICD-10-CM | POA: Diagnosis present

## 2015-09-27 DIAGNOSIS — G40909 Epilepsy, unspecified, not intractable, without status epilepticus: Secondary | ICD-10-CM | POA: Diagnosis not present

## 2015-09-27 DIAGNOSIS — M1990 Unspecified osteoarthritis, unspecified site: Secondary | ICD-10-CM | POA: Insufficient documentation

## 2015-09-27 DIAGNOSIS — E119 Type 2 diabetes mellitus without complications: Secondary | ICD-10-CM | POA: Diagnosis not present

## 2015-09-27 DIAGNOSIS — J45909 Unspecified asthma, uncomplicated: Secondary | ICD-10-CM | POA: Insufficient documentation

## 2015-09-27 DIAGNOSIS — Z7982 Long term (current) use of aspirin: Secondary | ICD-10-CM | POA: Diagnosis not present

## 2015-09-27 DIAGNOSIS — R112 Nausea with vomiting, unspecified: Secondary | ICD-10-CM | POA: Insufficient documentation

## 2015-09-27 DIAGNOSIS — Z9049 Acquired absence of other specified parts of digestive tract: Secondary | ICD-10-CM | POA: Diagnosis not present

## 2015-09-27 DIAGNOSIS — R1084 Generalized abdominal pain: Secondary | ICD-10-CM | POA: Insufficient documentation

## 2015-09-27 DIAGNOSIS — Z79899 Other long term (current) drug therapy: Secondary | ICD-10-CM | POA: Diagnosis not present

## 2015-09-27 LAB — CBC WITH DIFFERENTIAL/PLATELET
BASOS ABS: 0 10*3/uL (ref 0.0–0.1)
BASOS PCT: 0 %
EOS ABS: 0.1 10*3/uL (ref 0.0–0.7)
EOS PCT: 1 %
HCT: 37.3 % (ref 36.0–46.0)
Hemoglobin: 11.6 g/dL — ABNORMAL LOW (ref 12.0–15.0)
Lymphocytes Relative: 31 %
Lymphs Abs: 2.9 10*3/uL (ref 0.7–4.0)
MCH: 24.6 pg — ABNORMAL LOW (ref 26.0–34.0)
MCHC: 31.1 g/dL (ref 30.0–36.0)
MCV: 79.2 fL (ref 78.0–100.0)
MONO ABS: 1 10*3/uL (ref 0.1–1.0)
Monocytes Relative: 10 %
Neutro Abs: 5.3 10*3/uL (ref 1.7–7.7)
Neutrophils Relative %: 58 %
Platelets: 209 10*3/uL (ref 150–400)
RBC: 4.71 MIL/uL (ref 3.87–5.11)
RDW: 16.6 % — AB (ref 11.5–15.5)
WBC: 9.3 10*3/uL (ref 4.0–10.5)

## 2015-09-27 LAB — COMPREHENSIVE METABOLIC PANEL
ALBUMIN: 3.2 g/dL — AB (ref 3.5–5.0)
ALK PHOS: 102 U/L (ref 38–126)
ALT: 21 U/L (ref 14–54)
AST: 31 U/L (ref 15–41)
Anion gap: 11 (ref 5–15)
BILIRUBIN TOTAL: 1 mg/dL (ref 0.3–1.2)
BUN: 7 mg/dL (ref 6–20)
CALCIUM: 8.9 mg/dL (ref 8.9–10.3)
CO2: 21 mmol/L — ABNORMAL LOW (ref 22–32)
CREATININE: 1.26 mg/dL — AB (ref 0.44–1.00)
Chloride: 108 mmol/L (ref 101–111)
GFR calc Af Amer: 54 mL/min — ABNORMAL LOW (ref >60)
GFR, EST NON AFRICAN AMERICAN: 46 mL/min — AB (ref >60)
GLUCOSE: 94 mg/dL (ref 65–99)
Potassium: 3.2 mmol/L — ABNORMAL LOW (ref 3.5–5.1)
Sodium: 140 mmol/L (ref 135–145)
TOTAL PROTEIN: 7.1 g/dL (ref 6.5–8.1)

## 2015-09-27 LAB — I-STAT TROPONIN, ED: TROPONIN I, POC: 0.02 ng/mL (ref 0.00–0.08)

## 2015-09-27 LAB — LIPASE, BLOOD: LIPASE: 74 U/L — AB (ref 11–51)

## 2015-09-27 MED ORDER — ONDANSETRON HCL 4 MG/2ML IJ SOLN
4.0000 mg | Freq: Once | INTRAMUSCULAR | Status: AC
  Administered 2015-09-27: 4 mg via INTRAVENOUS
  Filled 2015-09-27: qty 2

## 2015-09-27 MED ORDER — DIPHENHYDRAMINE HCL 50 MG/ML IJ SOLN
50.0000 mg | Freq: Once | INTRAMUSCULAR | Status: AC
  Administered 2015-09-27: 50 mg via INTRAVENOUS
  Filled 2015-09-27: qty 1

## 2015-09-27 MED ORDER — SODIUM CHLORIDE 0.9 % IV SOLN
1000.0000 mL | Freq: Once | INTRAVENOUS | Status: AC
  Administered 2015-09-27: 1000 mL via INTRAVENOUS

## 2015-09-27 MED ORDER — IOHEXOL 300 MG/ML  SOLN
80.0000 mL | Freq: Once | INTRAMUSCULAR | Status: AC | PRN
  Administered 2015-09-27: 80 mL via INTRAVENOUS

## 2015-09-27 MED ORDER — HYDROMORPHONE HCL 1 MG/ML IJ SOLN
1.0000 mg | Freq: Once | INTRAMUSCULAR | Status: AC
  Administered 2015-09-27: 1 mg via INTRAVENOUS
  Filled 2015-09-27: qty 1

## 2015-09-27 MED ORDER — BARIUM SULFATE 2.1 % PO SUSP
ORAL | Status: DC
Start: 2015-09-27 — End: 2015-09-28
  Filled 2015-09-27: qty 1

## 2015-09-27 MED ORDER — KETOROLAC TROMETHAMINE 30 MG/ML IJ SOLN
30.0000 mg | Freq: Once | INTRAMUSCULAR | Status: AC
  Administered 2015-09-27: 30 mg via INTRAVENOUS
  Filled 2015-09-27: qty 1

## 2015-09-27 MED ORDER — HYDROCORTISONE NA SUCCINATE PF 100 MG IJ SOLR
200.0000 mg | Freq: Once | INTRAMUSCULAR | Status: AC
  Administered 2015-09-27: 200 mg via INTRAVENOUS
  Filled 2015-09-27: qty 4

## 2015-09-27 NOTE — ED Notes (Signed)
Pt defecated on herself.  States she cannot control when she has diarrhea.  Yellow feces noted in underwear.  Pt cleaned with soap and water, placed in dry underwear, given new sheets and new warm blankets.

## 2015-09-27 NOTE — ED Notes (Signed)
MD at bedside. 

## 2015-09-27 NOTE — ED Notes (Signed)
MD made aware of pt's request for additional pain medication

## 2015-09-27 NOTE — ED Notes (Signed)
Pt wished to refuse Toradol.  States it does "not help at all".  Pt encouraged to attempt, daughter stated same.  Pt accepted, but states she will need "dilaudid"

## 2015-09-27 NOTE — ED Provider Notes (Signed)
CSN: [id number]     Arrival date & time 09/27/15  1646 History   First MD Initiated Contact with Patient 09/27/15 1646     Chief Complaint  Patient presents with  . Chest Pain  . Abdominal Pain     (Consider location/radiation/quality/duration/timing/severity/associated sxs/prior Treatment) Patient is a 59 y.o. female presenting with chest pain and abdominal pain. The history is provided by the patient and medical records. No language interpreter was used.  Chest Pain Associated symptoms: abdominal pain, back pain (chronic), nausea and vomiting   Associated symptoms: no cough, no dizziness, no headache, no palpitations, no shortness of breath and no weakness   Abdominal Pain Associated symptoms: chest pain, diarrhea, nausea and vomiting   Associated symptoms: no constipation, no cough, no shortness of breath and no sore throat    Jessamy Sill is a 59 y.o. female  with a PMH of DM, pancreatitis, HTN, GERD presents to the Emergency Department complaining of worsening sharp epigastric abdominal pain x 3-4 days. Pt. States pain radiates to left shoulder. Associated with nausea, vomiting, and diarrhea x 2-3 days. Patient states her only fluids throughout this time period has been mountain dew.    Past Medical History  Diagnosis Date  . Arthritis   . Asthma   . Hypertension   . Constipation   . GERD (gastroesophageal reflux disease)   . Diabetes mellitus without complication (HCC)   . Chronic pain   . Seizures (HCC)     last seizure March 2015   Past Surgical History  Procedure Laterality Date  . Back surgery    . Cholecystectomy    . Appendectomy     Family History  Problem Relation Age of Onset  . Kidney failure Mother   . Hypertension Sister   . Hypertension Brother    Social History  Substance Use Topics  . Smoking status: Former Games developer  . Smokeless tobacco: Never Used  . Alcohol Use: No     Comment: admits to 2 drinks/week   OB History    No data available      Review of Systems  Constitutional: Negative.   HENT: Negative for congestion, rhinorrhea and sore throat.   Eyes: Negative for visual disturbance.  Respiratory: Negative for cough, shortness of breath and wheezing.   Cardiovascular: Positive for chest pain. Negative for palpitations and leg swelling.  Gastrointestinal: Positive for nausea, vomiting, abdominal pain and diarrhea. Negative for constipation and blood in stool.  Endocrine: Negative for polydipsia and polyuria.  Musculoskeletal: Positive for back pain (chronic) and arthralgias (chronic knee pain). Negative for myalgias and neck pain.  Skin: Negative for rash.  Neurological: Negative for dizziness, weakness and headaches.      Allergies  Hydromorphone and Ivp dye  Home Medications   Prior to Admission medications   Medication Sig Start Date End Date Taking? Authorizing Provider  albuterol (PROVENTIL HFA;VENTOLIN HFA) 108 (90 BASE) MCG/ACT inhaler Inhale 2 puffs into the lungs every 6 (six) hours as needed for wheezing or shortness of breath.    Historical Provider, MD  allopurinol (ZYLOPRIM) 300 MG tablet Take 300 mg by mouth daily.    Historical Provider, MD  amLODipine (NORVASC) 5 MG tablet Take 5 mg by mouth daily.    Historical Provider, MD  aspirin EC 81 MG tablet Take 1 tablet (81 mg total) by mouth daily. 06/14/15   Richarda Overlie, MD  atorvastatin (LIPITOR) 10 MG tablet Take 10 mg by mouth daily. 08/13/15   Historical Provider, MD  diazepam (VALIUM) 5 MG tablet Take 1 tablet (5 mg total) by mouth every 12 (twelve) hours as needed for muscle spasms. 08/25/15   Rodolph Bong, MD  doxycycline (VIBRAMYCIN) 50 MG capsule Take 50 mg by mouth 3 (three) times daily.    Historical Provider, MD  esomeprazole (NEXIUM) 40 MG capsule Take 40 mg by mouth daily at 12 noon.    Historical Provider, MD  Fluticasone-Salmeterol (ADVAIR) 250-50 MCG/DOSE AEPB Inhale 1 puff into the lungs 2 (two) times daily.    Historical Provider, MD   folic acid (FOLVITE) 1 MG tablet Take 1 mg by mouth daily.    Historical Provider, MD  furosemide (LASIX) 40 MG tablet Take 1 tablet (40 mg total) by mouth daily. 06/14/15   Richarda Overlie, MD  gabapentin (NEURONTIN) 300 MG capsule Take 1 capsule (300 mg total) by mouth 2 (two) times daily. 06/14/15   Richarda Overlie, MD  levETIRAcetam (KEPPRA) 500 MG tablet Take 1 tablet (500 mg total) by mouth 2 (two) times daily. 01/27/15   Calvert Cantor, MD  LINZESS 145 MCG CAPS capsule Take 145 mcg by mouth daily as needed. 08/13/15   Historical Provider, MD  metFORMIN (GLUCOPHAGE-XR) 500 MG 24 hr tablet Take 500 mg by mouth daily with breakfast.    Historical Provider, MD  metoprolol succinate (TOPROL-XL) 25 MG 24 hr tablet Take 25 mg by mouth daily.    Historical Provider, MD  Oxycodone HCl 20 MG TABS Take 20 mg by mouth every 4 (four) hours.    Historical Provider, MD  oxymorphone (OPANA ER) 40 MG 12 hr tablet Take 40 mg by mouth every 12 (twelve) hours.    Historical Provider, MD  polyethylene glycol (MIRALAX / GLYCOLAX) packet Take 17 g by mouth 2 (two) times daily. 06/14/15   Richarda Overlie, MD  potassium chloride SA (K-DUR,KLOR-CON) 20 MEQ tablet Take 2 tablets (40 mEq total) by mouth daily. 01/27/15   Calvert Cantor, MD  predniSONE (DELTASONE) 5 MG tablet Take 15 mg by mouth daily. 08/13/15   Historical Provider, MD  tiZANidine (ZANAFLEX) 4 MG tablet Take 4 mg by mouth 4 (four) times daily. 08/13/15   Historical Provider, MD  Vitamin D, Ergocalciferol, (DRISDOL) 50000 UNITS CAPS capsule Take 1 capsule (50,000 Units total) by mouth every 7 (seven) days. Sunday Patient taking differently: Take 50,000 Units by mouth every 7 (seven) days.  01/27/15   Calvert Cantor, MD  VITAMIN E PO Take 1 capsule by mouth daily.    Historical Provider, MD   BP 141/90 mmHg  Pulse 102  Temp(Src) 99.2 F (37.3 C) (Oral)  Resp 20  SpO2 100% Physical Exam  Constitutional: She is oriented to person, place, and time. She appears well-developed  and well-nourished.  Appears uncomfortable and in pain, but in no acute distress  HENT:  Head: Normocephalic and atraumatic.  Dry mucous membranes  Cardiovascular: Normal rate, regular rhythm, normal heart sounds and intact distal pulses.  Exam reveals no gallop and no friction rub.   No murmur heard. Pulmonary/Chest: Effort normal and breath sounds normal. No respiratory distress. She has no wheezes. She has no rales. She exhibits no tenderness.  Abdominal: She exhibits no mass. There is no rebound and no guarding.  Generalized abdominal tenderness, worse in epigastrium. Bowel sounds positive in all four quadrants.   Musculoskeletal: She exhibits no edema.  Moves all extremities well x 4   Neurological: She is alert and oriented to person, place, and time.  Skin: Skin is warm  and dry. No rash noted.  Skin is very dry/ashy Cap refill delayed  Psychiatric: She has a normal mood and affect. Her behavior is normal. Judgment and thought content normal.  Nursing note and vitals reviewed.   ED Course  Procedures (including critical care time) Labs Review Labs Reviewed  CBC WITH DIFFERENTIAL/PLATELET  COMPREHENSIVE METABOLIC PANEL  LIPASE, BLOOD    Imaging Review No results found. I have personally reviewed and evaluated these images and lab results as part of my medical decision-making.   EKG Interpretation None      MDM   Final diagnoses:  None   Humaira Davidovich presents with epigastric abdominal pain. Due to radiation to left chest, EKG & troponin to rule out cardiac etiology.   Labs: Trop 0.02, CBC reassuring, CMP: K+ 3.2, CO2 21, Cr 1.26, GFR 54, Alb 3.2; Lipase of 74 - If pancreatitis, I would expect lipase to be elevated higher.  CT pending at shift change.   Filed Vitals:   09/27/15 1700  BP: 141/90  Pulse: 102  Temp: 99.2 F (37.3 C)  Resp: 20   Patient seen by and discussed with Dr. Juleen China who agrees with treatment plan.   Care assumed by Dr. Juleen China,  case discussed, plan agreed upon.     [facility], PA-C 09/27/15 2009  Raeford Razor, MD 10/10/15 1536

## 2015-09-27 NOTE — ED Notes (Signed)
Pt able to dress and ambulate to wheelchair with assistance.  Pt has home nursing care when at home.

## 2015-09-27 NOTE — ED Notes (Addendum)
Pt defecated in bed again.  Pt states she uses a walker and bedside commode at home, with assistance.  Pt states she does not think she can get up today to use a commode.  Pt cleansed with soap and water and new brief applied.    Pt requesting pain medication.  PA made aware.

## 2015-09-27 NOTE — ED Notes (Signed)
Per EMS:  Pt from home where she called out for epigastric pain x 3 days with radiation into the left chest.  Pt has a hx of pancreatitis.  Pt c/o chills, n/v/d.

## 2015-10-10 IMAGING — DX DG SHOULDER 2+V*L*
2 series · 2 of 2 positions shown · non-contrast
Comparison: None.

CLINICAL DATA: Left shoulder pain after falling on the left side.

EXAM:
LEFT SHOULDER - 2+ VIEW

[shoulder grashey]
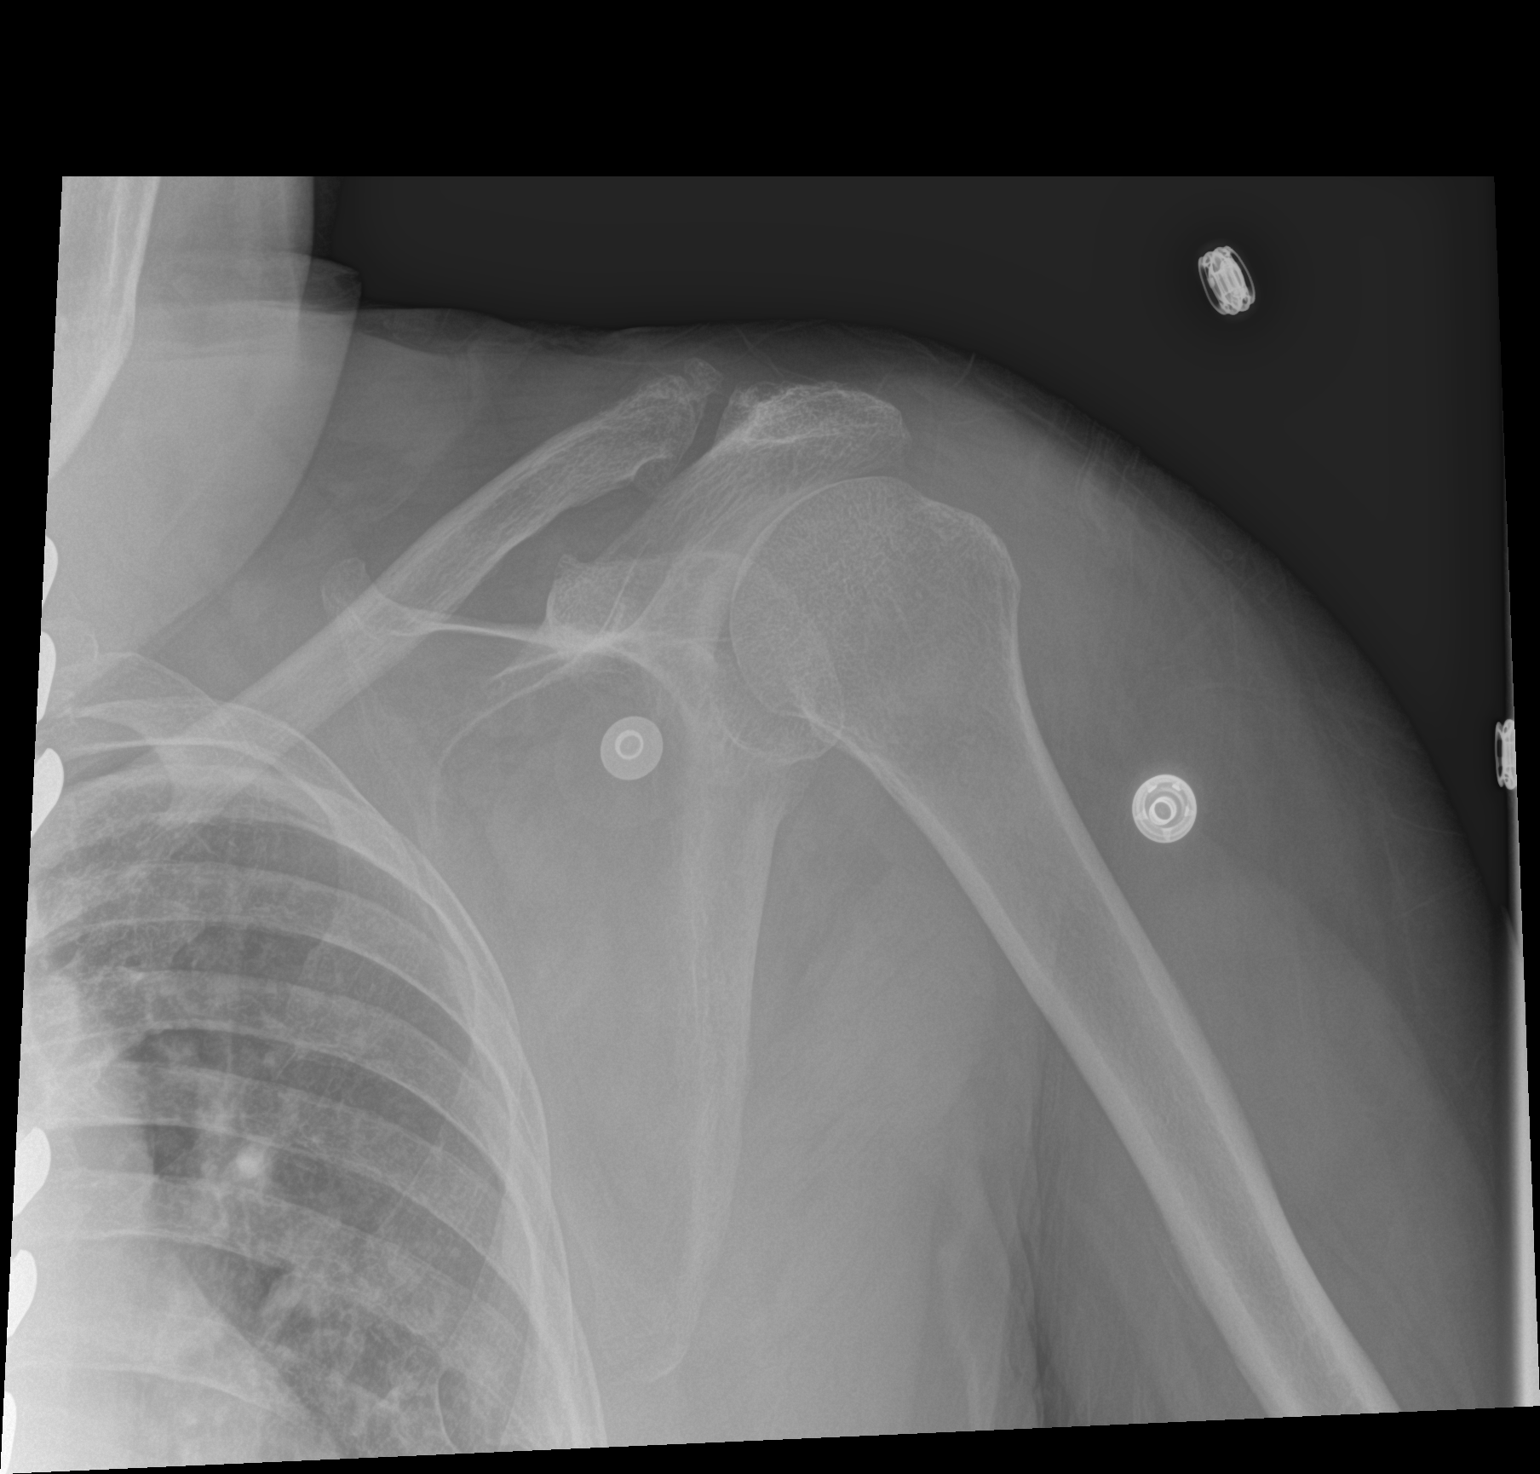

[shoulder y view]
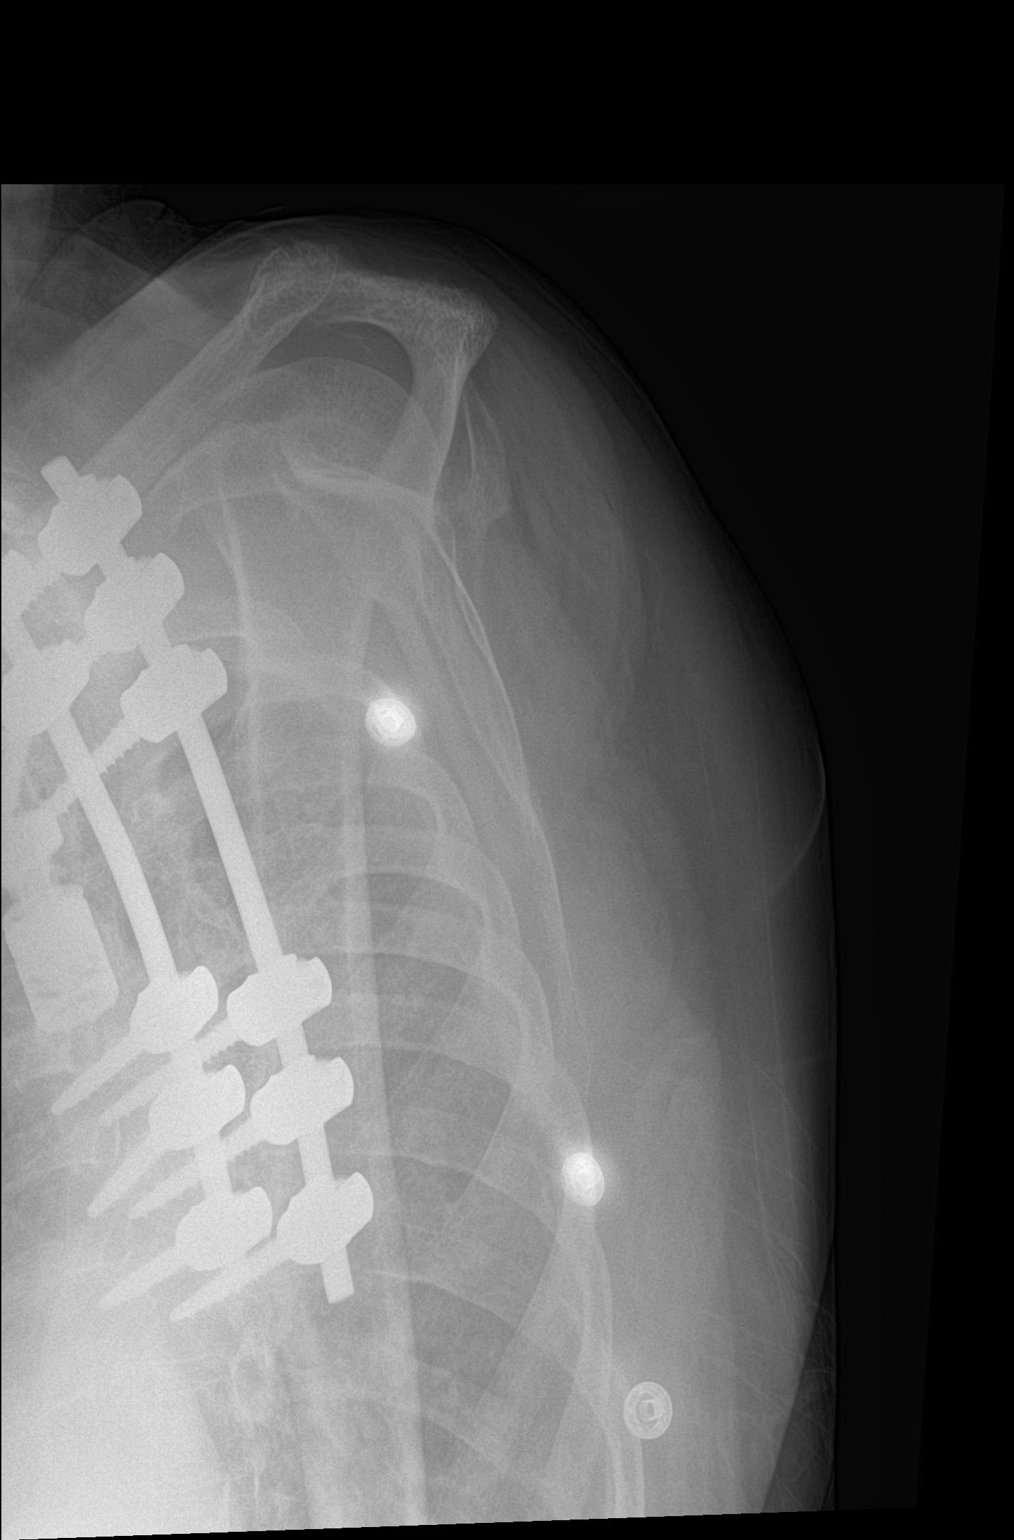

[2 of 2 positions shown; findings below may reference images not displayed]

FINDINGS: Negative for acute fracture, dislocation or radiopaque foreign body.
There is old fragmentation of the distal clavicle, probably sequela
from remote trauma. AC joint is intact. Glenohumeral articulation is
intact.
IMPRESSION: Negative for acute fracture.

## 2015-10-10 IMAGING — DX DG CHEST 2V
2 series · 2 of 2 positions shown · non-contrast
Comparison: 07/29/2015

CLINICAL DATA: Shoulder and left anterior chest wall pain after
falling onto left side.

EXAM:
CHEST  2 VIEW

[chest lat]
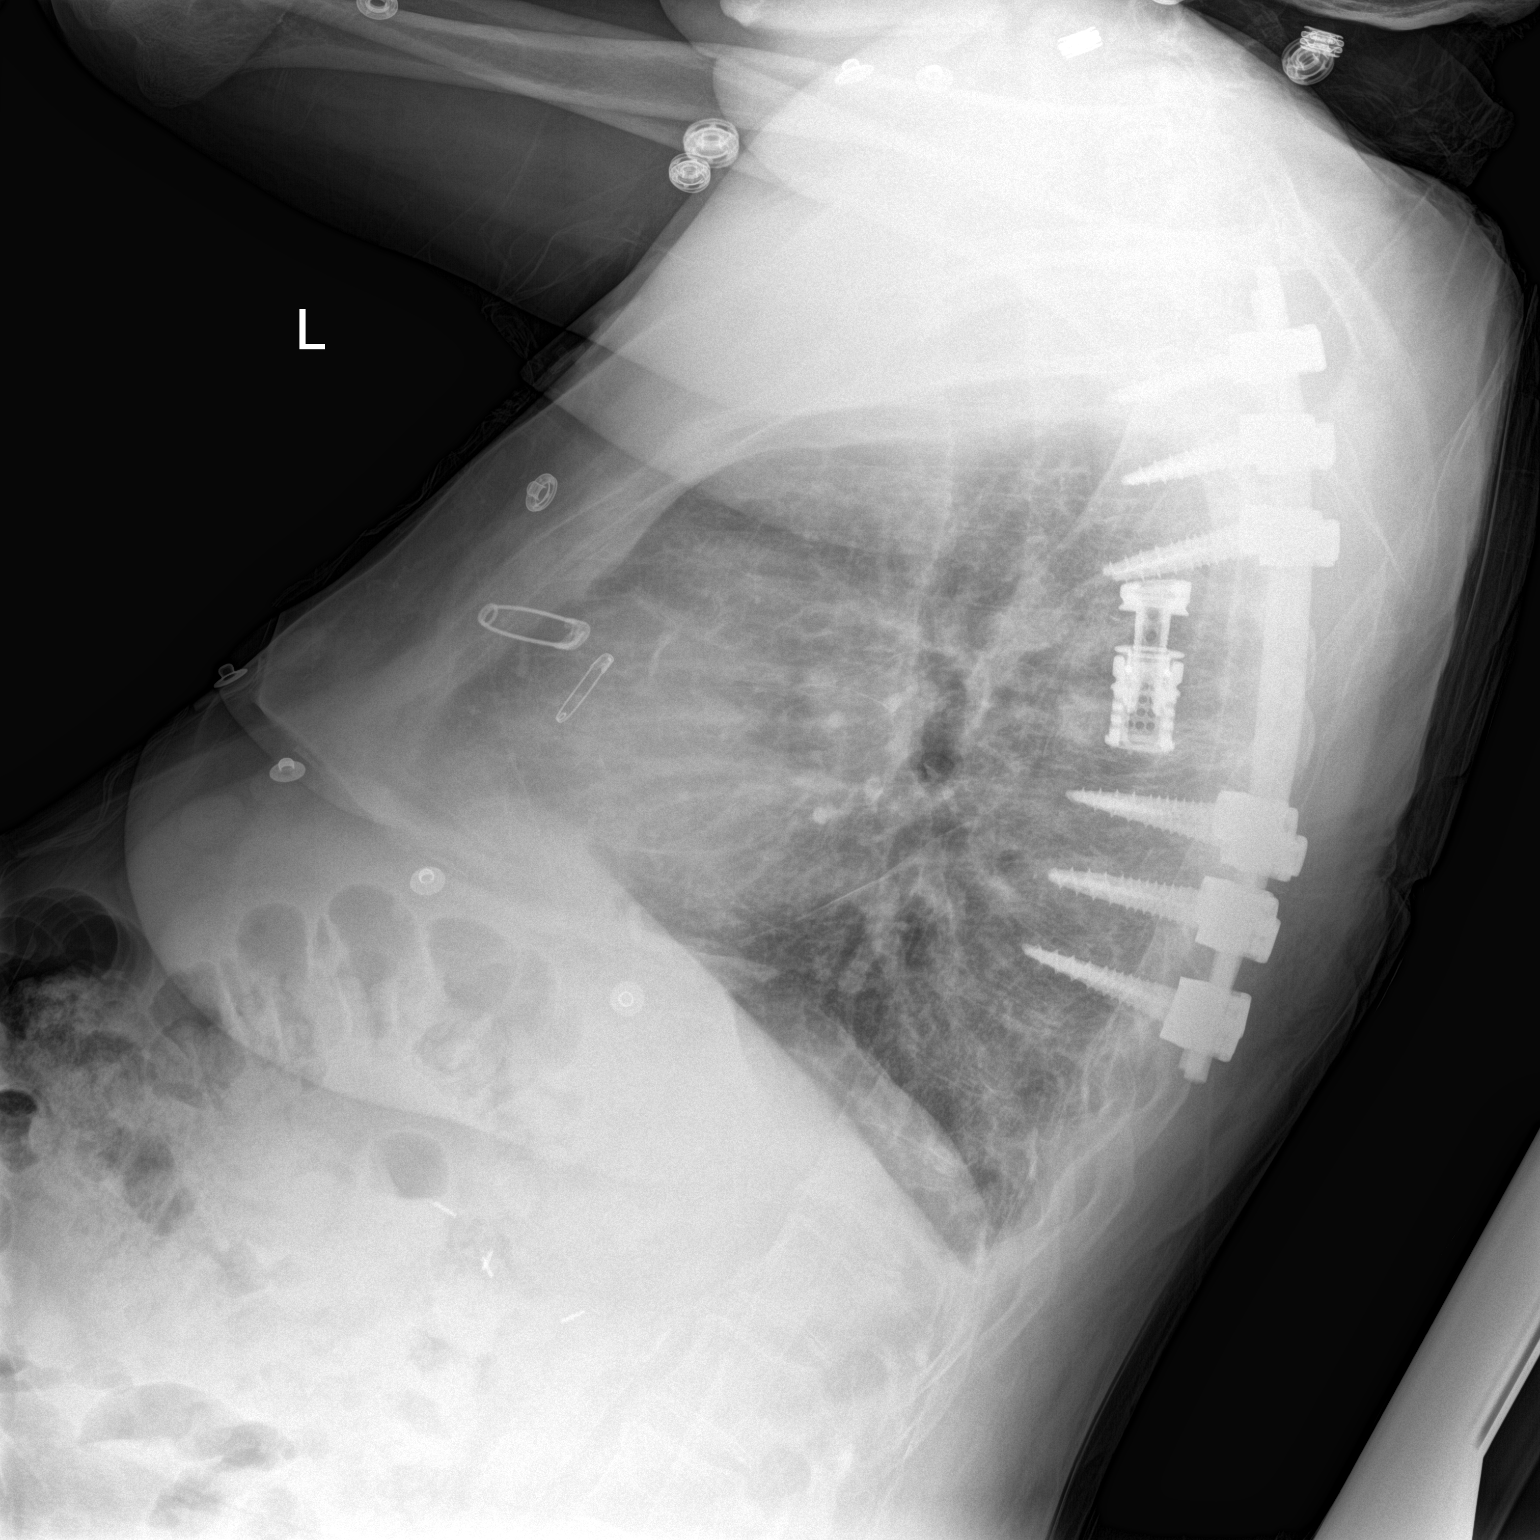

[chest ap]
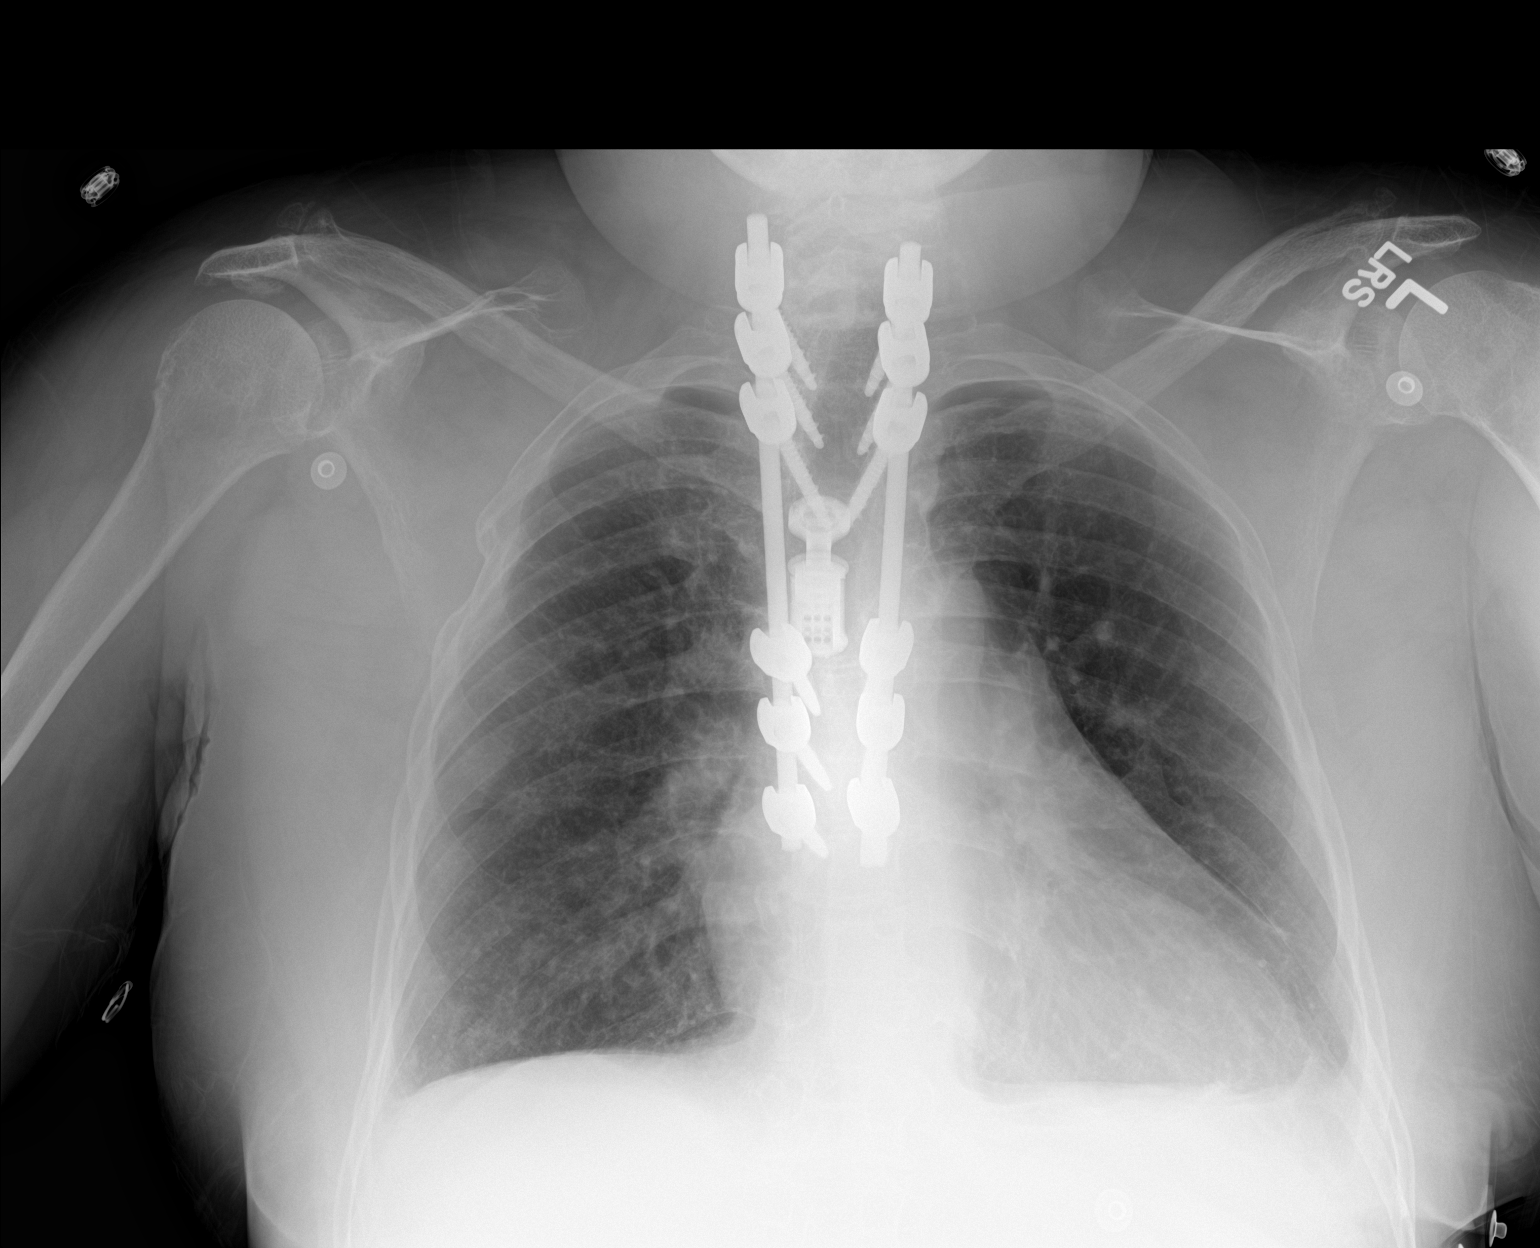

[2 of 2 positions shown; findings below may reference images not displayed]

FINDINGS: There are intact appearances of the thoracic spinal fixation
hardware. No acute fracture is evident. There is no pneumothorax.
There is no effusion. Mild chronic lateral costophrenic angle
pleural thickening is present on the left. The lungs are clear.
Mediastinal contours are normal.
IMPRESSION: No evidence of significant intrathoracic traumatic injury.

## 2015-10-21 DIAGNOSIS — J189 Pneumonia, unspecified organism: Secondary | ICD-10-CM

## 2015-10-21 HISTORY — DX: Pneumonia, unspecified organism: J18.9

## 2015-11-06 ENCOUNTER — Emergency Department (HOSPITAL_BASED_OUTPATIENT_CLINIC_OR_DEPARTMENT_OTHER): Payer: Medicaid Other

## 2015-11-06 ENCOUNTER — Encounter (HOSPITAL_BASED_OUTPATIENT_CLINIC_OR_DEPARTMENT_OTHER): Payer: Self-pay | Admitting: Emergency Medicine

## 2015-11-06 ENCOUNTER — Inpatient Hospital Stay (HOSPITAL_BASED_OUTPATIENT_CLINIC_OR_DEPARTMENT_OTHER)
Admission: EM | Admit: 2015-11-06 | Discharge: 2015-11-10 | DRG: 313 | Disposition: A | Discharge status: Home Health | Payer: Medicaid Other | Attending: Internal Medicine | Admitting: Internal Medicine

## 2015-11-06 DIAGNOSIS — Z7952 Long term (current) use of systemic steroids: Secondary | ICD-10-CM | POA: Diagnosis not present

## 2015-11-06 DIAGNOSIS — Z87898 Personal history of other specified conditions: Secondary | ICD-10-CM

## 2015-11-06 DIAGNOSIS — Z8249 Family history of ischemic heart disease and other diseases of the circulatory system: Secondary | ICD-10-CM

## 2015-11-06 DIAGNOSIS — Z87891 Personal history of nicotine dependence: Secondary | ICD-10-CM | POA: Diagnosis not present

## 2015-11-06 DIAGNOSIS — Z79899 Other long term (current) drug therapy: Secondary | ICD-10-CM

## 2015-11-06 DIAGNOSIS — R140 Abdominal distension (gaseous): Secondary | ICD-10-CM | POA: Insufficient documentation

## 2015-11-06 DIAGNOSIS — M109 Gout, unspecified: Secondary | ICD-10-CM | POA: Diagnosis present

## 2015-11-06 DIAGNOSIS — R079 Chest pain, unspecified: Secondary | ICD-10-CM | POA: Diagnosis present

## 2015-11-06 DIAGNOSIS — K219 Gastro-esophageal reflux disease without esophagitis: Secondary | ICD-10-CM | POA: Diagnosis present

## 2015-11-06 DIAGNOSIS — G40909 Epilepsy, unspecified, not intractable, without status epilepticus: Secondary | ICD-10-CM | POA: Diagnosis present

## 2015-11-06 DIAGNOSIS — Z91041 Radiographic dye allergy status: Secondary | ICD-10-CM | POA: Diagnosis not present

## 2015-11-06 DIAGNOSIS — R109 Unspecified abdominal pain: Secondary | ICD-10-CM | POA: Diagnosis present

## 2015-11-06 DIAGNOSIS — K5903 Drug induced constipation: Secondary | ICD-10-CM | POA: Diagnosis present

## 2015-11-06 DIAGNOSIS — I10 Essential (primary) hypertension: Secondary | ICD-10-CM | POA: Diagnosis present

## 2015-11-06 DIAGNOSIS — T402X5A Adverse effect of other opioids, initial encounter: Secondary | ICD-10-CM | POA: Diagnosis present

## 2015-11-06 DIAGNOSIS — I248 Other forms of acute ischemic heart disease: Secondary | ICD-10-CM | POA: Diagnosis present

## 2015-11-06 DIAGNOSIS — G894 Chronic pain syndrome: Secondary | ICD-10-CM | POA: Diagnosis present

## 2015-11-06 DIAGNOSIS — J45909 Unspecified asthma, uncomplicated: Secondary | ICD-10-CM | POA: Diagnosis present

## 2015-11-06 DIAGNOSIS — R112 Nausea with vomiting, unspecified: Secondary | ICD-10-CM | POA: Diagnosis present

## 2015-11-06 DIAGNOSIS — R0789 Other chest pain: Principal | ICD-10-CM | POA: Diagnosis present

## 2015-11-06 DIAGNOSIS — R1084 Generalized abdominal pain: Secondary | ICD-10-CM | POA: Diagnosis not present

## 2015-11-06 DIAGNOSIS — Z7982 Long term (current) use of aspirin: Secondary | ICD-10-CM

## 2015-11-06 DIAGNOSIS — I214 Non-ST elevation (NSTEMI) myocardial infarction: Secondary | ICD-10-CM | POA: Diagnosis present

## 2015-11-06 DIAGNOSIS — K5909 Other constipation: Secondary | ICD-10-CM | POA: Diagnosis not present

## 2015-11-06 DIAGNOSIS — I119 Hypertensive heart disease without heart failure: Secondary | ICD-10-CM | POA: Diagnosis present

## 2015-11-06 DIAGNOSIS — Z7984 Long term (current) use of oral hypoglycemic drugs: Secondary | ICD-10-CM | POA: Diagnosis not present

## 2015-11-06 DIAGNOSIS — E119 Type 2 diabetes mellitus without complications: Secondary | ICD-10-CM | POA: Diagnosis present

## 2015-11-06 DIAGNOSIS — E876 Hypokalemia: Secondary | ICD-10-CM | POA: Diagnosis present

## 2015-11-06 DIAGNOSIS — M79606 Pain in leg, unspecified: Secondary | ICD-10-CM

## 2015-11-06 DIAGNOSIS — G8929 Other chronic pain: Secondary | ICD-10-CM | POA: Diagnosis present

## 2015-11-06 DIAGNOSIS — K5900 Constipation, unspecified: Secondary | ICD-10-CM | POA: Diagnosis present

## 2015-11-06 DIAGNOSIS — R918 Other nonspecific abnormal finding of lung field: Secondary | ICD-10-CM

## 2015-11-06 DIAGNOSIS — J189 Pneumonia, unspecified organism: Secondary | ICD-10-CM

## 2015-11-06 HISTORY — DX: Pneumonia, unspecified organism: J18.9

## 2015-11-06 LAB — CBC
HEMATOCRIT: 34 % — AB (ref 36.0–46.0)
HEMOGLOBIN: 10.9 g/dL — AB (ref 12.0–15.0)
MCH: 24.8 pg — AB (ref 26.0–34.0)
MCHC: 32.1 g/dL (ref 30.0–36.0)
MCV: 77.3 fL — AB (ref 78.0–100.0)
Platelets: 279 10*3/uL (ref 150–400)
RBC: 4.4 MIL/uL (ref 3.87–5.11)
RDW: 16.2 % — ABNORMAL HIGH (ref 11.5–15.5)
WBC: 9.8 10*3/uL (ref 4.0–10.5)

## 2015-11-06 LAB — BASIC METABOLIC PANEL
Anion gap: 10 (ref 5–15)
BUN: 15 mg/dL (ref 6–20)
CO2: 25 mmol/L (ref 22–32)
Calcium: 8.9 mg/dL (ref 8.9–10.3)
Chloride: 103 mmol/L (ref 101–111)
Creatinine, Ser: 0.93 mg/dL (ref 0.44–1.00)
GFR calc non Af Amer: >60 mL/min (ref >60)
GLUCOSE: 117 mg/dL — AB (ref 65–99)
Potassium: 3.3 mmol/L — ABNORMAL LOW (ref 3.5–5.1)
Sodium: 138 mmol/L (ref 135–145)

## 2015-11-06 LAB — TROPONIN I: Troponin I: 0.06 ng/mL — ABNORMAL HIGH (ref <0.031)

## 2015-11-06 MED ORDER — DEXTROSE 5 % IV SOLN
1.0000 g | Freq: Once | INTRAVENOUS | Status: AC
  Administered 2015-11-06: 1 g via INTRAVENOUS

## 2015-11-06 MED ORDER — CEFEPIME HCL 1 G IJ SOLR
INTRAMUSCULAR | Status: AC
  Filled 2015-11-06: qty 1

## 2015-11-06 MED ORDER — HYDROMORPHONE HCL 1 MG/ML IJ SOLN
0.5000 mg | Freq: Once | INTRAMUSCULAR | Status: AC
  Administered 2015-11-06: 0.5 mg via INTRAVENOUS
  Filled 2015-11-06: qty 1

## 2015-11-06 MED ORDER — POTASSIUM CHLORIDE CRYS ER 20 MEQ PO TBCR
40.0000 meq | EXTENDED_RELEASE_TABLET | Freq: Once | ORAL | Status: AC
  Administered 2015-11-06: 40 meq via ORAL
  Filled 2015-11-06: qty 2

## 2015-11-06 MED ORDER — VANCOMYCIN HCL IN DEXTROSE 1-5 GM/200ML-% IV SOLN
1000.0000 mg | Freq: Once | INTRAVENOUS | Status: DC
  Filled 2015-11-06: qty 200

## 2015-11-06 NOTE — ED Provider Notes (Signed)
CSN: [id number]     Arrival date & time 11/06/15  1925 History   By signing my name below, I, Amber [name], attest that this documentation has been prepared under the direction and in the presence of Amber Bucco, MD. Electronically Signed: Evon [name], ED Scribe. 11/06/2015. 9:11 PM.      Chief Complaint  Patient presents with  . Chest Pain   Patient is a 59 y.o. female presenting with chest pain. The history is provided by the patient. No language interpreter was used.  Chest Pain Associated symptoms: cough, fatigue, nausea and vomiting   Associated symptoms: no abdominal pain, no back pain, no diaphoresis, no dizziness, no fever, no headache, no numbness, no shortness of breath and no weakness    HPI Comments: Amber [name] is a 59 y.o. female who presents to the Emergency Department complaining of recurrent, intermittent sharp left sidedCP onset several weeks prior but recently worsened the past 3 days. Pt reports nausea, vomiting, cough, fever, chills, and leg myalgias. Pt states that the pain radiates to her left arm. Pt states that she is not able to tolerate liquids or solids. Pt states that the pain is worse with movement. Pt doesn't report any alleviating symptoms. Pt denies any other symptoms. She's had similar type left sided chest pain in the past. She was admitted in October for similar symptoms, as well as the month prior to that for pneumonia with associated left-sided chest pain.  Past Medical History  Diagnosis Date  . Arthritis   . Asthma   . Hypertension   . Constipation   . GERD (gastroesophageal reflux disease)   . Diabetes mellitus without complication (HCC)   . Chronic pain   . Seizures (HCC)     last seizure March 2015   Past Surgical History  Procedure Laterality Date  . Back surgery    . Cholecystectomy    . Appendectomy     Family History  Problem Relation Age of Onset  . Kidney failure Mother   . Hypertension Sister   . Hypertension Brother     Social History  Substance Use Topics  . Smoking status: Former Games developer  . Smokeless tobacco: Never Used  . Alcohol Use: No     Comment: admits to 2 drinks/week   OB History    No data available     Review of Systems  Constitutional: Positive for chills and fatigue. Negative for fever and diaphoresis.  HENT: Negative for congestion, rhinorrhea and sneezing.   Eyes: Negative.   Respiratory: Positive for cough. Negative for chest tightness and shortness of breath.   Cardiovascular: Positive for chest pain. Negative for leg swelling.  Gastrointestinal: Positive for nausea and vomiting. Negative for abdominal pain, diarrhea and blood in stool.  Genitourinary: Negative for frequency, hematuria, flank pain and difficulty urinating.  Musculoskeletal: Positive for myalgias. Negative for back pain and arthralgias.  Skin: Negative for rash.  Neurological: Negative for dizziness, speech difficulty, weakness, numbness and headaches.      Allergies  Ivp dye  Home Medications   Prior to Admission medications   Medication Sig Start Date End Date Taking? Authorizing Provider  albuterol (PROVENTIL HFA;VENTOLIN HFA) 108 (90 BASE) MCG/ACT inhaler Inhale 2 puffs into the lungs every 6 (six) hours as needed for wheezing or shortness of breath.    Historical Provider, MD  allopurinol (ZYLOPRIM) 300 MG tablet Take 300 mg by mouth daily.    Historical Provider, MD  amLODipine (NORVASC) 5 MG tablet Take 5 mg  by mouth daily.    Historical Provider, MD  aspirin EC 81 MG tablet Take 1 tablet (81 mg total) by mouth daily. 06/14/15   Richarda Overlie, MD  atorvastatin (LIPITOR) 10 MG tablet Take 10 mg by mouth daily. 08/13/15   Historical Provider, MD  diazepam (VALIUM) 5 MG tablet Take 1 tablet (5 mg total) by mouth every 12 (twelve) hours as needed for muscle spasms. 08/25/15   Rodolph Bong, MD  doxycycline (VIBRAMYCIN) 50 MG capsule Take 50 mg by mouth 3 (three) times daily.    Historical Provider, MD   esomeprazole (NEXIUM) 40 MG capsule Take 40 mg by mouth daily at 12 noon.    Historical Provider, MD  Fluticasone-Salmeterol (ADVAIR) 250-50 MCG/DOSE AEPB Inhale 1 puff into the lungs 2 (two) times daily.    Historical Provider, MD  folic acid (FOLVITE) 1 MG tablet Take 1 mg by mouth daily.    Historical Provider, MD  furosemide (LASIX) 40 MG tablet Take 1 tablet (40 mg total) by mouth daily. 06/14/15   Richarda Overlie, MD  gabapentin (NEURONTIN) 300 MG capsule Take 1 capsule (300 mg total) by mouth 2 (two) times daily. 06/14/15   Richarda Overlie, MD  levETIRAcetam (KEPPRA) 500 MG tablet Take 1 tablet (500 mg total) by mouth 2 (two) times daily. Patient not taking: Reported on 09/27/2015 01/27/15   Calvert Cantor, MD  LINZESS 145 MCG CAPS capsule Take 145 mcg by mouth daily as needed. 08/13/15   Historical Provider, MD  metFORMIN (GLUCOPHAGE-XR) 500 MG 24 hr tablet Take 500 mg by mouth daily with breakfast.    Historical Provider, MD  metoprolol succinate (TOPROL-XL) 25 MG 24 hr tablet Take 25 mg by mouth daily.    Historical Provider, MD  Oxycodone HCl 20 MG TABS Take 20 mg by mouth every 4 (four) hours.    Historical Provider, MD  oxymorphone (OPANA ER) 40 MG 12 hr tablet Take 40 mg by mouth every 12 (twelve) hours.    Historical Provider, MD  polyethylene glycol (MIRALAX / GLYCOLAX) packet Take 17 g by mouth 2 (two) times daily. 06/14/15   Richarda Overlie, MD  potassium chloride SA (K-DUR,KLOR-CON) 20 MEQ tablet Take 2 tablets (40 mEq total) by mouth daily. 01/27/15   Calvert Cantor, MD  predniSONE (DELTASONE) 5 MG tablet Take 15 mg by mouth daily. 08/13/15   Historical Provider, MD  tiZANidine (ZANAFLEX) 4 MG tablet Take 4 mg by mouth 4 (four) times daily. 08/13/15   Historical Provider, MD  Vitamin D, Ergocalciferol, (DRISDOL) 50000 UNITS CAPS capsule Take 1 capsule (50,000 Units total) by mouth every 7 (seven) days. Sunday Patient taking differently: Take 50,000 Units by mouth every 7 (seven) days.  01/27/15   Calvert Cantor, MD  VITAMIN E PO Take 1 capsule by mouth daily.    Historical Provider, MD   BP 147/91 mmHg  Pulse 80  Temp(Src) 98.4 F (36.9 C) (Oral)  Resp 15  Ht 5' (1.524 m)  Wt 142 lb (64.411 kg)  BMI 27.73 kg/m2  SpO2 97%   Physical Exam  Constitutional: She is oriented to person, place, and time. She appears well-developed and well-nourished.  HENT:  Head: Normocephalic and atraumatic.  Eyes: Pupils are equal, round, and reactive to light.  Neck: Normal range of motion. Neck supple.  Cardiovascular: Normal rate, regular rhythm and normal heart sounds.   Pulmonary/Chest: Effort normal and breath sounds normal. No respiratory distress. She has no wheezes. She has no rales. She exhibits tenderness (  Moderate tenderness to the left chest wall).  Abdominal: Soft. Bowel sounds are normal. There is no tenderness. There is no rebound and no guarding.  Musculoskeletal: Normal range of motion. She exhibits edema.  Trace edema to the extremities bilaterally   Lymphadenopathy:    She has no cervical adenopathy.  Neurological: She is alert and oriented to person, place, and time.  Skin: Skin is warm and dry. No rash noted.  Psychiatric: She has a normal mood and affect.  Nursing note and vitals reviewed.   ED Course  Procedures (including critical care time) DIAGNOSTIC STUDIES: Oxygen Saturation is 97% on RA, normal by my interpretation.    COORDINATION OF CARE: 9:11 PM-Discussed treatment plan with pt at bedside and pt agreed to plan.     Labs Review Labs Reviewed  BASIC METABOLIC PANEL - Abnormal; Notable for the following:    Potassium 3.3 (*)    Glucose, Bld 117 (*)    All other components within normal limits  CBC - Abnormal; Notable for the following:    Hemoglobin 10.9 (*)    HCT 34.0 (*)    MCV 77.3 (*)    MCH 24.8 (*)    RDW 16.2 (*)    All other components within normal limits  TROPONIN I - Abnormal; Notable for the following:    Troponin I 0.06 (*)    All other  components within normal limits    Imaging Review Dg Chest 2 View  11/06/2015  CLINICAL DATA:  59 year old female with a history of chest pain and vomiting. EXAM: CHEST - 2 VIEW COMPARISON:  08/24/2015, 07/29/2015, 07/26/2015, 06/11/2015 FINDINGS: Cardiomediastinal silhouette unchanged in size and contour. No evidence of pulmonary vascular congestion. Bilateral interstitial opacities, more pronounced than on the comparison plain film, without confluent airspace disease. No pleural effusion. No pneumothorax. Similar appearance of flattened hemidiaphragms. Posttraumatic changes of the right-sided ribs again noted. Surgical changes of thoracic fixation with corpectomy and interbody spacer. IMPRESSION: Coarsened interstitial opacities, more pronounced than on the comparison study, potentially representing developing infection. No evidence of lobar pneumonia or pleural effusion. Similar appearance of surgical changes of thoracic fixation and corpectomy. Signed, Yvone Neu. Loreta Ave, DO [facility] Electronically Signed   By: Gilmer Mor D.O.   On: 11/06/2015 20:07   I have personally reviewed and evaluated these images and lab results as part of my medical decision-making.   EKG Interpretation   Date/Time:  Saturday November 06 2015 19:32:25 EST Ventricular Rate:  83 PR Interval:  110 QRS Duration: 62 QT Interval:  360 QTC Calculation: 423 R Axis:   34 Text Interpretation:  Sinus rhythm with short PR Otherwise normal ECG  since last tracing no significant change Confirmed by Zacharee Gaddie  MD, Kypton Eltringham  ([id number]) on 11/06/2015 9:11:07 PM      MDM   Final diagnoses:  None   patient presents with left-sided chest pain in association with fever cough and vomiting. Her pain seems to be chronic in nature. She's been seen several times for similar type pain. I have a low suspicion that it's cardiac in nature, however her troponin is mildly elevated. It  is worse with palpation and worse with movement. She does however have evidence of pneumonia on x-ray. She's had ongoing vomiting and states she's not able to keep any food or fluids down. She's had some intermittent mild tachycardia. She was started on antibiotics for healthcare associated pneumonia during her recent hospitalization in October. She was given  pain medication as well as potassium replacement. I will consult the hospitalist for admission.  I spoke with Dr. Clyde Lundborg who accepts pt as a full admission to telemetry  I personally performed the services described in this documentation, which was scribed in my presence.  The recorded information has been reviewed and considered.       Amber Bucco, MD 11/06/15 2139

## 2015-11-06 NOTE — Progress Notes (Signed)
   Transfer from [facility] per Dr. Fredderick Phenix.  59 year old lady with past medical history of hypertension, hyperlipidemia, diabetes mellitus, asthma, GERD, anxiety, seizure, arthritis, chronic pain, who presents with chest pain, cough, shortness of breath, fever, chills, nausea, vomiting, weakness which has been going on for 3 days. Chest x-ray showed coarsened interstitial opacity, but no solid consolidation.   Found to have elevated troponin 0.06, EKG okay, WBC 9.8, temperature normal, transient tachycardia, potassium 3.3, renal function okay. Patient is accepted to tele bed. IV vancomycin and cefepime started by ED.  Lorretta Harp, MD  [facility] Pager [phone]  If 7PM-7AM, please contact night-coverage [email or web address] Password [facility] 11/06/2015, 9:57 PM

## 2015-11-06 NOTE — ED Notes (Signed)
Granddaughter at bedside bathing patient.

## 2015-11-06 NOTE — ED Notes (Signed)
Patient states that she has had pain to her chest with vomiting xc 3 days. Reports that SOB and feeling weak all over for that last 3 days

## 2015-11-07 ENCOUNTER — Inpatient Hospital Stay (HOSPITAL_COMMUNITY): Payer: Medicaid Other

## 2015-11-07 DIAGNOSIS — E876 Hypokalemia: Secondary | ICD-10-CM

## 2015-11-07 DIAGNOSIS — R1084 Generalized abdominal pain: Secondary | ICD-10-CM

## 2015-11-07 DIAGNOSIS — R140 Abdominal distension (gaseous): Secondary | ICD-10-CM | POA: Insufficient documentation

## 2015-11-07 DIAGNOSIS — M109 Gout, unspecified: Secondary | ICD-10-CM

## 2015-11-07 DIAGNOSIS — R112 Nausea with vomiting, unspecified: Secondary | ICD-10-CM

## 2015-11-07 DIAGNOSIS — G8929 Other chronic pain: Secondary | ICD-10-CM

## 2015-11-07 DIAGNOSIS — K5900 Constipation, unspecified: Secondary | ICD-10-CM

## 2015-11-07 DIAGNOSIS — R109 Unspecified abdominal pain: Secondary | ICD-10-CM | POA: Diagnosis present

## 2015-11-07 DIAGNOSIS — J189 Pneumonia, unspecified organism: Secondary | ICD-10-CM

## 2015-11-07 DIAGNOSIS — J45909 Unspecified asthma, uncomplicated: Secondary | ICD-10-CM

## 2015-11-07 LAB — GLUCOSE, CAPILLARY
GLUCOSE-CAPILLARY: 151 mg/dL — AB (ref 65–99)
GLUCOSE-CAPILLARY: 141 mg/dL — AB (ref 65–99)
Glucose-Capillary: 101 mg/dL — ABNORMAL HIGH (ref 65–99)
Glucose-Capillary: 164 mg/dL — ABNORMAL HIGH (ref 65–99)
Glucose-Capillary: 167 mg/dL — ABNORMAL HIGH (ref 65–99)

## 2015-11-07 LAB — BASIC METABOLIC PANEL
Anion gap: 7 (ref 5–15)
BUN: 9 mg/dL (ref 6–20)
CALCIUM: 8.3 mg/dL — AB (ref 8.9–10.3)
CO2: 23 mmol/L (ref 22–32)
Chloride: 109 mmol/L (ref 101–111)
Creatinine, Ser: 0.85 mg/dL (ref 0.44–1.00)
GFR calc Af Amer: >60 mL/min (ref >60)
GLUCOSE: 120 mg/dL — AB (ref 65–99)
POTASSIUM: 3.3 mmol/L — AB (ref 3.5–5.1)
SODIUM: 139 mmol/L (ref 135–145)

## 2015-11-07 LAB — CBC
HCT: 28.5 % — ABNORMAL LOW (ref 36.0–46.0)
Hemoglobin: 8.9 g/dL — ABNORMAL LOW (ref 12.0–15.0)
MCH: 24.9 pg — ABNORMAL LOW (ref 26.0–34.0)
MCHC: 31.2 g/dL (ref 30.0–36.0)
MCV: 79.6 fL (ref 78.0–100.0)
PLATELETS: 202 10*3/uL (ref 150–400)
RBC: 3.58 MIL/uL — ABNORMAL LOW (ref 3.87–5.11)
RDW: 16.4 % — AB (ref 11.5–15.5)
WBC: 6.2 10*3/uL (ref 4.0–10.5)

## 2015-11-07 LAB — TROPONIN I
TROPONIN I: 0.06 ng/mL — AB (ref <0.031)
TROPONIN I: 0.05 ng/mL — AB (ref <0.031)
TROPONIN I: 0.05 ng/mL — AB (ref <0.031)

## 2015-11-07 MED ORDER — POTASSIUM CHLORIDE CRYS ER 20 MEQ PO TBCR
40.0000 meq | EXTENDED_RELEASE_TABLET | Freq: Every day | ORAL | Status: DC
  Administered 2015-11-07 – 2015-11-09 (×3): 40 meq via ORAL
  Filled 2015-11-07 (×4): qty 2

## 2015-11-07 MED ORDER — SODIUM CHLORIDE 0.9 % IJ SOLN
3.0000 mL | Freq: Two times a day (BID) | INTRAMUSCULAR | Status: DC
  Administered 2015-11-07 – 2015-11-10 (×7): 3 mL via INTRAVENOUS

## 2015-11-07 MED ORDER — ALLOPURINOL 300 MG PO TABS
300.0000 mg | ORAL_TABLET | Freq: Every day | ORAL | Status: DC
  Administered 2015-11-07 – 2015-11-10 (×4): 300 mg via ORAL
  Filled 2015-11-07 (×4): qty 1

## 2015-11-07 MED ORDER — LEVETIRACETAM 500 MG PO TABS
500.0000 mg | ORAL_TABLET | Freq: Two times a day (BID) | ORAL | Status: DC
  Administered 2015-11-07 – 2015-11-10 (×7): 500 mg via ORAL
  Filled 2015-11-07 (×8): qty 1

## 2015-11-07 MED ORDER — ALBUTEROL SULFATE (2.5 MG/3ML) 0.083% IN NEBU: 2.5000 mg | INHALATION_SOLUTION | Freq: Four times a day (QID) | RESPIRATORY_TRACT | Status: DC | PRN

## 2015-11-07 MED ORDER — FUROSEMIDE 40 MG PO TABS
40.0000 mg | ORAL_TABLET | Freq: Every day | ORAL | Status: DC
  Administered 2015-11-07 – 2015-11-10 (×4): 40 mg via ORAL
  Filled 2015-11-07 (×4): qty 1

## 2015-11-07 MED ORDER — ATORVASTATIN CALCIUM 10 MG PO TABS
10.0000 mg | ORAL_TABLET | Freq: Every day | ORAL | Status: DC
  Administered 2015-11-07 – 2015-11-10 (×4): 10 mg via ORAL
  Filled 2015-11-07 (×4): qty 1

## 2015-11-07 MED ORDER — SODIUM CHLORIDE 0.9 % IV SOLN
INTRAVENOUS | Status: AC
  Administered 2015-11-07 (×2): via INTRAVENOUS

## 2015-11-07 MED ORDER — INSULIN ASPART 100 UNIT/ML ~~LOC~~ SOLN
0.0000 [IU] | Freq: Three times a day (TID) | SUBCUTANEOUS | Status: DC
  Administered 2015-11-07 (×2): 2 [IU] via SUBCUTANEOUS
  Administered 2015-11-08 – 2015-11-09 (×6): 1 [IU] via SUBCUTANEOUS
  Administered 2015-11-10: 2 [IU] via SUBCUTANEOUS

## 2015-11-07 MED ORDER — ACETAMINOPHEN 325 MG PO TABS
650.0000 mg | ORAL_TABLET | Freq: Four times a day (QID) | ORAL | Status: DC | PRN
  Administered 2015-11-07: 650 mg via ORAL
  Filled 2015-11-07: qty 2

## 2015-11-07 MED ORDER — ENOXAPARIN SODIUM 40 MG/0.4ML ~~LOC~~ SOLN
40.0000 mg | SUBCUTANEOUS | Status: DC
Start: 2015-11-08 — End: 2015-11-10
  Administered 2015-11-08 – 2015-11-10 (×3): 40 mg via SUBCUTANEOUS
  Filled 2015-11-07 (×3): qty 0.4

## 2015-11-07 MED ORDER — DIAZEPAM 5 MG PO TABS
5.0000 mg | ORAL_TABLET | Freq: Two times a day (BID) | ORAL | Status: DC | PRN
  Administered 2015-11-07 – 2015-11-08 (×2): 5 mg via ORAL
  Filled 2015-11-07 (×2): qty 1

## 2015-11-07 MED ORDER — ONDANSETRON HCL 4 MG/2ML IJ SOLN
4.0000 mg | Freq: Four times a day (QID) | INTRAMUSCULAR | Status: DC | PRN
  Administered 2015-11-09 – 2015-11-10 (×2): 4 mg via INTRAVENOUS
  Filled 2015-11-07 (×3): qty 2

## 2015-11-07 MED ORDER — SODIUM CHLORIDE 0.9 % IV SOLN
500.0000 mg | Freq: Two times a day (BID) | INTRAVENOUS | Status: DC
  Administered 2015-11-07 – 2015-11-09 (×5): 500 mg via INTRAVENOUS
  Filled 2015-11-07 (×6): qty 500

## 2015-11-07 MED ORDER — METOPROLOL SUCCINATE ER 25 MG PO TB24
25.0000 mg | ORAL_TABLET | Freq: Every day | ORAL | Status: DC
  Administered 2015-11-07 – 2015-11-10 (×4): 25 mg via ORAL
  Filled 2015-11-07 (×4): qty 1

## 2015-11-07 MED ORDER — DOCUSATE SODIUM 100 MG PO CAPS
100.0000 mg | ORAL_CAPSULE | Freq: Two times a day (BID) | ORAL | Status: DC
  Administered 2015-11-07 – 2015-11-10 (×8): 100 mg via ORAL
  Filled 2015-11-07 (×8): qty 1

## 2015-11-07 MED ORDER — HYDROMORPHONE HCL 1 MG/ML IJ SOLN
0.5000 mg | INTRAMUSCULAR | Status: DC | PRN
  Administered 2015-11-07 – 2015-11-10 (×7): 1 mg via INTRAVENOUS
  Filled 2015-11-07 (×8): qty 1

## 2015-11-07 MED ORDER — INSULIN ASPART 100 UNIT/ML ~~LOC~~ SOLN: 0.0000 [IU] | Freq: Every day | SUBCUTANEOUS | Status: DC

## 2015-11-07 MED ORDER — ENSURE ENLIVE PO LIQD
237.0000 mL | Freq: Two times a day (BID) | ORAL | Status: DC
  Administered 2015-11-07 – 2015-11-10 (×7): 237 mL via ORAL

## 2015-11-07 MED ORDER — FOLIC ACID 1 MG PO TABS
1.0000 mg | ORAL_TABLET | Freq: Every day | ORAL | Status: DC
  Administered 2015-11-07 – 2015-11-10 (×4): 1 mg via ORAL
  Filled 2015-11-07 (×4): qty 1

## 2015-11-07 MED ORDER — MORPHINE SULFATE ER 100 MG PO TBCR
100.0000 mg | EXTENDED_RELEASE_TABLET | Freq: Two times a day (BID) | ORAL | Status: DC
Start: 2015-11-07 — End: 2015-11-10
  Administered 2015-11-07 – 2015-11-10 (×8): 100 mg via ORAL
  Filled 2015-11-07 (×8): qty 1

## 2015-11-07 MED ORDER — NITROGLYCERIN 2 % TD OINT
0.5000 [in_us] | TOPICAL_OINTMENT | Freq: Four times a day (QID) | TRANSDERMAL | Status: DC
  Administered 2015-11-07 – 2015-11-10 (×14): 0.5 [in_us] via TOPICAL
  Filled 2015-11-07: qty 30

## 2015-11-07 MED ORDER — POLYETHYLENE GLYCOL 3350 17 G PO PACK
17.0000 g | PACK | Freq: Two times a day (BID) | ORAL | Status: DC
  Administered 2015-11-07 – 2015-11-09 (×6): 17 g via ORAL
  Filled 2015-11-07 (×7): qty 1

## 2015-11-07 MED ORDER — OXYCODONE HCL 5 MG PO TABS
5.0000 mg | ORAL_TABLET | ORAL | Status: DC | PRN
  Administered 2015-11-07 – 2015-11-10 (×6): 5 mg via ORAL
  Filled 2015-11-07 (×7): qty 1

## 2015-11-07 MED ORDER — ALBUTEROL SULFATE (2.5 MG/3ML) 0.083% IN NEBU: 2.5000 mg | INHALATION_SOLUTION | RESPIRATORY_TRACT | Status: DC | PRN

## 2015-11-07 MED ORDER — PANTOPRAZOLE SODIUM 40 MG PO TBEC
40.0000 mg | DELAYED_RELEASE_TABLET | Freq: Every day | ORAL | Status: DC
  Administered 2015-11-07 – 2015-11-10 (×4): 40 mg via ORAL
  Filled 2015-11-07 (×5): qty 1

## 2015-11-07 MED ORDER — GABAPENTIN 300 MG PO CAPS
300.0000 mg | ORAL_CAPSULE | Freq: Two times a day (BID) | ORAL | Status: DC
  Administered 2015-11-07 – 2015-11-10 (×8): 300 mg via ORAL
  Filled 2015-11-07 (×8): qty 1

## 2015-11-07 MED ORDER — ENOXAPARIN SODIUM 30 MG/0.3ML ~~LOC~~ SOLN
30.0000 mg | SUBCUTANEOUS | Status: DC
  Administered 2015-11-07: 30 mg via SUBCUTANEOUS
  Filled 2015-11-07 (×2): qty 0.3

## 2015-11-07 MED ORDER — ONDANSETRON HCL 4 MG PO TABS: 4.0000 mg | ORAL_TABLET | Freq: Four times a day (QID) | ORAL | Status: DC | PRN

## 2015-11-07 MED ORDER — ALUM & MAG HYDROXIDE-SIMETH 200-200-20 MG/5ML PO SUSP: 30.0000 mL | Freq: Four times a day (QID) | ORAL | Status: DC | PRN

## 2015-11-07 MED ORDER — ACETAMINOPHEN 650 MG RE SUPP: 650.0000 mg | Freq: Four times a day (QID) | RECTAL | Status: DC | PRN

## 2015-11-07 MED ORDER — ASPIRIN EC 325 MG PO TBEC
325.0000 mg | DELAYED_RELEASE_TABLET | Freq: Every day | ORAL | Status: DC
  Administered 2015-11-07 – 2015-11-10 (×4): 325 mg via ORAL
  Filled 2015-11-07 (×4): qty 1

## 2015-11-07 MED ORDER — AMLODIPINE BESYLATE 5 MG PO TABS
5.0000 mg | ORAL_TABLET | Freq: Every day | ORAL | Status: DC
  Administered 2015-11-07 – 2015-11-10 (×4): 5 mg via ORAL
  Filled 2015-11-07 (×4): qty 1

## 2015-11-07 MED ORDER — ALBUTEROL SULFATE (2.5 MG/3ML) 0.083% IN NEBU
2.5000 mg | INHALATION_SOLUTION | Freq: Four times a day (QID) | RESPIRATORY_TRACT | Status: DC
  Administered 2015-11-07 (×2): 2.5 mg via RESPIRATORY_TRACT
  Filled 2015-11-07 (×4): qty 3

## 2015-11-07 MED ORDER — DEXTROSE 5 % IV SOLN
1.0000 g | Freq: Three times a day (TID) | INTRAVENOUS | Status: DC
  Administered 2015-11-07 – 2015-11-09 (×8): 1 g via INTRAVENOUS
  Filled 2015-11-07 (×10): qty 1

## 2015-11-07 NOTE — Progress Notes (Signed)
ANTIBIOTIC CONSULT NOTE - INITIAL  Pharmacy Consult for vancomycin Indication: rule out pneumonia  Allergies  Allergen Reactions  . Ivp Dye [Iodinated Diagnostic Agents] Itching    Patient Measurements: Height: 5' (152.4 cm) Weight: 142 lb 3.2 oz (64.501 kg) IBW/kg (Calculated) : 45.5  Vital Signs: Temp: 98.7 F (37.1 C) (12/17 2347) Temp Source: Oral (12/17 2347) BP: 156/99 mmHg (12/17 2347) Pulse Rate: 114 (12/17 2347)  Labs:  Recent Labs  11/06/15 1945  WBC 9.8  HGB 10.9*  PLT 279  CREATININE 0.93   Estimated Creatinine Clearance: 55.9 mL/min (by C-G formula based on Cr of 0.93).  Medical History: Past Medical History  Diagnosis Date  . Arthritis   . Asthma   . Hypertension   . Constipation   . GERD (gastroesophageal reflux disease)   . Diabetes mellitus without complication (HCC)   . Chronic pain   . Seizures (HCC)     last seizure March 2015    Medications:  Prescriptions prior to admission  Medication Sig Dispense Refill Last Dose  . albuterol (PROVENTIL HFA;VENTOLIN HFA) 108 (90 BASE) MCG/ACT inhaler Inhale 2 puffs into the lungs every 6 (six) hours as needed for wheezing or shortness of breath.   Past Week at Unknown time  . allopurinol (ZYLOPRIM) 300 MG tablet Take 300 mg by mouth daily.   Past Week at Unknown time  . amLODipine (NORVASC) 5 MG tablet Take 5 mg by mouth daily.   Past Week at Unknown time  . aspirin EC 81 MG tablet Take 1 tablet (81 mg total) by mouth daily. 30 tablet 2 Past Week at Unknown time  . atorvastatin (LIPITOR) 10 MG tablet Take 10 mg by mouth daily.  5 Past Week at Unknown time  . diazepam (VALIUM) 5 MG tablet Take 1 tablet (5 mg total) by mouth every 12 (twelve) hours as needed for muscle spasms. 10 tablet 0 Past Week at Unknown time  . doxycycline (VIBRAMYCIN) 50 MG capsule Take 50 mg by mouth 3 (three) times daily.   Past Week at Unknown time  . esomeprazole (NEXIUM) 40 MG capsule Take 40 mg by mouth daily at 12 noon.    Past Week at Unknown time  . Fluticasone-Salmeterol (ADVAIR) 250-50 MCG/DOSE AEPB Inhale 1 puff into the lungs 2 (two) times daily.   Past Week at Unknown time  . folic acid (FOLVITE) 1 MG tablet Take 1 mg by mouth daily.   Past Week at Unknown time  . furosemide (LASIX) 40 MG tablet Take 1 tablet (40 mg total) by mouth daily. 30 tablet 1 Past Week at Unknown time  . gabapentin (NEURONTIN) 300 MG capsule Take 1 capsule (300 mg total) by mouth 2 (two) times daily. 60 capsule 0 Past Week at Unknown time  . levETIRAcetam (KEPPRA) 500 MG tablet Take 1 tablet (500 mg total) by mouth 2 (two) times daily. (Patient not taking: Reported on 09/27/2015) 60 tablet 0 Completed Course at Unknown time  . LINZESS 145 MCG CAPS capsule Take 145 mcg by mouth daily as needed.  11 Past Week at Unknown time  . metFORMIN (GLUCOPHAGE-XR) 500 MG 24 hr tablet Take 500 mg by mouth daily with breakfast.   Past Week at Unknown time  . metoprolol succinate (TOPROL-XL) 25 MG 24 hr tablet Take 25 mg by mouth daily.   Past Week at Unknown time  . Oxycodone HCl 20 MG TABS Take 20 mg by mouth every 4 (four) hours.   Past Week at Unknown time  .  oxymorphone (OPANA ER) 40 MG 12 hr tablet Take 40 mg by mouth every 12 (twelve) hours.   Past Week at Unknown time  . polyethylene glycol (MIRALAX / GLYCOLAX) packet Take 17 g by mouth 2 (two) times daily. 14 each 0 Past Week at Unknown time  . potassium chloride SA (K-DUR,KLOR-CON) 20 MEQ tablet Take 2 tablets (40 mEq total) by mouth daily. 30 tablet 0 Past Week at Unknown time  . predniSONE (DELTASONE) 5 MG tablet Take 15 mg by mouth daily.  2 Past Week at Unknown time  . tiZANidine (ZANAFLEX) 4 MG tablet Take 4 mg by mouth 4 (four) times daily.  2 Past Week at Unknown time  . Vitamin D, Ergocalciferol, (DRISDOL) 50000 UNITS CAPS capsule Take 1 capsule (50,000 Units total) by mouth every 7 (seven) days. Sunday (Patient taking differently: Take 50,000 Units by mouth every 7 (seven) days. ) 30  capsule 0 Past Week at Unknown time  . VITAMIN E PO Take 1 capsule by mouth daily.   Past Week at Unknown time   Scheduled:  . albuterol  2.5 mg Nebulization Q6H  . ceFEPime (MAXIPIME) IV  1 g Intravenous 3 times per day  . ceFEPIme      . docusate sodium  100 mg Oral BID  . feeding supplement (ENSURE ENLIVE)  237 mL Oral BID BM  . vancomycin  1,000 mg Intravenous Once    Assessment: 59yo female c/o CP, cough, SOB, chills, N/V, and weakness x3d, CXR concerning for developing infection, to begin IV ABX.  Goal of Therapy:  Vancomycin trough level 15-20 mcg/ml  Plan:  Rec'd cefepime 1g and vanc 1g at [facility] (vanc not charted but RN reports that EMS states it was finishing infusing on way from [facility]); will continue with vancomycin 500mg  IV Q12H and monitor CBC, Cx, levels prn.  , PharmD, BCPS  11/07/2015,12:43 AM

## 2015-11-07 NOTE — Progress Notes (Signed)
Patient admitted earlier this morning by Dr. Della Goo. Please see full H&P for details. Agree with current assessment and plan.  59 year old female history of diabetes, hypertension, asthma, degenerative lumbar disease, who presented to emergency department with complaints of chest pain which abated intermittent over the past couple of weeks. Patient did have chest x-ray and was noted to have pneumonia. She was started on antibiotics with vancomycin and cefepime. Patient was also found to have an initial troponin 0.06, which has been cycled and currently 0.05.  Time spent: 20 minutes  Rayen Dafoe D.O. [facility] Pager [phone]  If 7PM-7AM, please contact night-coverage [email or web address] Password [email or web address] 11/07/2015, 12:21 PM

## 2015-11-07 NOTE — Progress Notes (Signed)
Utilization Review Completed.Karandeep Amber [name] T12/18/2016  

## 2015-11-07 NOTE — H&P (Signed)
[facility] Admission History and Physical       Genesis Novosad HYW:[id number] DOB: [birth year] DOA: 11/06/2015  Referring physician: EDP PCP: [name], MD  Specialists:   Chief Complaint: Chest Pain  HPI: Amber [name] is a 59 y.o. female with a history of DM2, HTN, Asthma, Lumbar DDD, and Seizures who presented to the [facility] ED with complaints of intermittent chest pain  For the past 2 weeks worse over the past 3 days .  She describes the pain as sharp and shooting pain, and the pain is associated with SOB, Nausea and Vomiting.  She has also had fevers and chills over the past 3 days.   She was found to have bilateral Interstitial opacities on Chest X-ray and was placed on IV antibiotics for HCAP.   Initial troponin level was 0.06, and she was transferred to [facility] for further evaluation.     Review of Systems:  Constitutional: No Weight Loss, No Weight Gain, Night Sweats, +Fevers, +Chills, Dizziness, Light Headedness, Fatigue, or Generalized Weakness HEENT: No Headaches, Difficulty Swallowing,Tooth/Dental Problems,Sore Throat,  No Sneezing, Rhinitis, Ear Ache, Nasal Congestion, or Post Nasal Drip,  Cardio-vascular:   +Chest pain, Orthopnea, PND, Edema in Lower Extremities, Anasarca, Dizziness, Palpitations  Resp:   +Dyspnea, No DOE, No Productive Cough, No Non-Productive Cough, No Hemoptysis, No Wheezing.    GI: No Heartburn, Indigestion, Abdominal Pain, +Nausea, +Vomiting, Diarrhea, +Constipation, Hematemesis, Hematochezia, Melena, Change in Bowel Habits,  Loss of Appetite  GU: No Dysuria, No Change in Color of Urine, No Urgency or Urinary Frequency, No Flank pain.  Musculoskeletal: No Joint Pain or Swelling, No Decreased Range of Motion, No Back Pain.  Neurologic: No Syncope, No Seizures, Muscle Weakness, Paresthesia, Vision Disturbance or Loss, No Diplopia, No Vertigo, +Difficulty Walking,  Skin: No Rash or Lesions. Psych: No Change in Mood or Affect, No Depression or  Anxiety, No Memory loss, No Confusion, or Hallucinations   Past Medical History  Diagnosis Date  . Arthritis   . Asthma   . Hypertension   . Constipation   . GERD (gastroesophageal reflux disease)   . Diabetes mellitus without complication (HCC)   . Chronic pain   . Seizures (HCC)     last seizure March 2015     Past Surgical History  Procedure Laterality Date  . Back surgery    . Cholecystectomy    . Appendectomy        Prior to Admission medications   Medication Sig Start Date End Date Taking? Authorizing Provider  albuterol (PROVENTIL HFA;VENTOLIN HFA) 108 (90 BASE) MCG/ACT inhaler Inhale 2 puffs into the lungs every 6 (six) hours as needed for wheezing or shortness of breath.    Historical Provider, MD  allopurinol (ZYLOPRIM) 300 MG tablet Take 300 mg by mouth daily.    Historical Provider, MD  amLODipine (NORVASC) 5 MG tablet Take 5 mg by mouth daily.    Historical Provider, MD  aspirin EC 81 MG tablet Take 1 tablet (81 mg total) by mouth daily. 06/14/15   Richarda Overlie, MD  atorvastatin (LIPITOR) 10 MG tablet Take 10 mg by mouth daily. 08/13/15   Historical Provider, MD  diazepam (VALIUM) 5 MG tablet Take 1 tablet (5 mg total) by mouth every 12 (twelve) hours as needed for muscle spasms. 08/25/15   Rodolph Bong, MD  doxycycline (VIBRAMYCIN) 50 MG capsule Take 50 mg by mouth 3 (three) times daily.    Historical Provider, MD  esomeprazole (NEXIUM) 40 MG capsule Take 40 mg  by mouth daily at 12 noon.    Historical Provider, MD  Fluticasone-Salmeterol (ADVAIR) 250-50 MCG/DOSE AEPB Inhale 1 puff into the lungs 2 (two) times daily.    Historical Provider, MD  folic acid (FOLVITE) 1 MG tablet Take 1 mg by mouth daily.    Historical Provider, MD  furosemide (LASIX) 40 MG tablet Take 1 tablet (40 mg total) by mouth daily. 06/14/15   Richarda Overlie, MD  gabapentin (NEURONTIN) 300 MG capsule Take 1 capsule (300 mg total) by mouth 2 (two) times daily. 06/14/15   Richarda Overlie, MD    levETIRAcetam (KEPPRA) 500 MG tablet Take 1 tablet (500 mg total) by mouth 2 (two) times daily. Patient not taking: Reported on 09/27/2015 01/27/15   Calvert Cantor, MD  LINZESS 145 MCG CAPS capsule Take 145 mcg by mouth daily as needed. 08/13/15   Historical Provider, MD  metFORMIN (GLUCOPHAGE-XR) 500 MG 24 hr tablet Take 500 mg by mouth daily with breakfast.    Historical Provider, MD  metoprolol succinate (TOPROL-XL) 25 MG 24 hr tablet Take 25 mg by mouth daily.    Historical Provider, MD  Oxycodone HCl 20 MG TABS Take 20 mg by mouth every 4 (four) hours.    Historical Provider, MD  oxymorphone (OPANA ER) 40 MG 12 hr tablet Take 40 mg by mouth every 12 (twelve) hours.    Historical Provider, MD  polyethylene glycol (MIRALAX / GLYCOLAX) packet Take 17 g by mouth 2 (two) times daily. 06/14/15   Richarda Overlie, MD  potassium chloride SA (K-DUR,KLOR-CON) 20 MEQ tablet Take 2 tablets (40 mEq total) by mouth daily. 01/27/15   Calvert Cantor, MD  predniSONE (DELTASONE) 5 MG tablet Take 15 mg by mouth daily. 08/13/15   Historical Provider, MD  tiZANidine (ZANAFLEX) 4 MG tablet Take 4 mg by mouth 4 (four) times daily. 08/13/15   Historical Provider, MD  Vitamin D, Ergocalciferol, (DRISDOL) 50000 UNITS CAPS capsule Take 1 capsule (50,000 Units total) by mouth every 7 (seven) days. Sunday Patient taking differently: Take 50,000 Units by mouth every 7 (seven) days.  01/27/15   Calvert Cantor, MD  VITAMIN E PO Take 1 capsule by mouth daily.    Historical Provider, MD     Allergies  Allergen Reactions  . Ivp Dye [Iodinated Diagnostic Agents] Itching    Social History:  reports that she has quit smoking. She has never used smokeless tobacco. She reports that she does not drink alcohol or use illicit drugs.    Family History  Problem Relation Age of Onset  . Kidney failure Mother   . Hypertension Sister   . Hypertension Brother        Physical Exam:  GEN:  Pleasant Obese 59 y.o. African American female examined  and in no acute distress; cooperative with exam Filed Vitals:   11/06/15 2130 11/06/15 2200 11/06/15 2230 11/06/15 2347  BP: 151/102 135/84 153/94 156/99  Pulse: 113 117 110 114  Temp:    98.7 F (37.1 C)  TempSrc:    Oral  Resp: 21 26 13    Height:      Weight:    64.501 kg (142 lb 3.2 oz)  SpO2: 94% 96% 96% 97%   Blood pressure 156/99, pulse 114, temperature 98.7 F (37.1 C), temperature source Oral, resp. rate 13, height 5' (1.524 m), weight 64.501 kg (142 lb 3.2 oz), SpO2 97 %. PSYCH: She is alert and oriented x4; does not appear anxious does not appear depressed; affect is normal HEENT: Normocephalic  and Atraumatic, Mucous membranes pink; PERRLA; EOM intact; Fundi:  Benign;  No scleral icterus, Nares: Patent, Oropharynx: Clear, Fair Dentition,    Neck:  FROM, No Cervical Lymphadenopathy nor Thyromegaly or Carotid Bruit; No JVD; Breasts:: Not examined CHEST WALL: No tenderness CHEST: Normal respiration, clear to auscultation bilaterally HEART: Regular rate and rhythm; no murmurs rubs or gallops BACK: No kyphosis or scoliosis; No CVA tenderness ABDOMEN: Positive Bowel Sounds, Firm, Distended Obese,  Non-Tender, No Rebound or Guarding; No Masses, No Organomegaly. Rectal Exam: Not done EXTREMITIES: No  Cyanosis, Clubbing, or Edema; No Ulcerations. Genitalia: not examined PULSES: 2+ and symmetric SKIN: Normal hydration no rash or ulceration CNS:  Alert and Oriented x 4, No Focal Deficits except BLE [place] [facility]: pulses palpable throughout    Labs on Admission:  Basic Metabolic Panel:  Recent Labs Lab 11/06/15 1945  NA 138  K 3.3*  CL 103  CO2 25  GLUCOSE 117*  BUN 15  CREATININE 0.93  CALCIUM 8.9   Liver Function Tests: No results for input(s): AST, ALT, ALKPHOS, BILITOT, PROT, ALBUMIN in the last 168 hours. No results for input(s): LIPASE, AMYLASE in the last 168 hours. No results for input(s): AMMONIA in the last 168 hours. CBC:  Recent Labs Lab  11/06/15 1945  WBC 9.8  HGB 10.9*  HCT 34.0*  MCV 77.3*  PLT 279   Cardiac Enzymes:  Recent Labs Lab 11/06/15 1945  TROPONINI 0.06*    BNP (last 3 results)  Recent Labs  06/11/15 2255 07/26/15 1931 07/27/15 0150  BNP 51.1 37.8 41.3    ProBNP (last 3 results) No results for input(s): PROBNP in the last 8760 hours.  CBG:  Recent Labs Lab 11/07/15 0007  GLUCAP 167*    Radiological Exams on Admission: Dg Chest 2 View  11/06/2015  CLINICAL DATA:  60 year old female with a history of chest pain and vomiting. EXAM: CHEST - 2 VIEW COMPARISON:  08/24/2015, 07/29/2015, 07/26/2015, 06/11/2015 FINDINGS: Cardiomediastinal silhouette unchanged in size and contour. No evidence of pulmonary vascular congestion. Bilateral interstitial opacities, more pronounced than on the comparison plain film, without confluent airspace disease. No pleural effusion. No pneumothorax. Similar appearance of flattened hemidiaphragms. Posttraumatic changes of the right-sided ribs again noted. Surgical changes of thoracic fixation with corpectomy and interbody spacer. IMPRESSION: Coarsened interstitial opacities, more pronounced than on the comparison study, potentially representing developing infection. No evidence of lobar pneumonia or pleural effusion. Similar appearance of surgical changes of thoracic fixation and corpectomy. Signed, Yvone Neu. Loreta Ave, DO [facility] Radiology Electronically Signed   By: Gilmer Mor D.O.   On: 11/06/2015 20:07     EKG: Independently reviewed. Normal Sinus Rhythm rate = 83 No Acute Changes     Assessment/Plan:      59 y.o. female with  Principal Problem:   1.     HCAP (healthcare-associated pneumonia)   IV Vancomycin and Cefepime   Albuterol Nebs    O2 PRN   Active Problems:   2.    Chest pain   Cardiac Monitoring   Cycle Troponins   Nitropaste, O2 Continue Carvedilol, ASA     3.     Nausea and Vomiting-  Most likely due to Opioid Induced Constipation   PRN IV Zofran   Address Constipation     4.    Constipation   KUB ordered   Colace Added BID   Continue Miralax Rx         5.   Hypokalemia  Replacement  KCl given PO   Check Magnesium Level     6.   History of seizures   Continue Keppra Rx     7.   DM type 2 causing complication (HCC)   Hold Metformin Rx   SSI Coverage PRN   Check HbA1C     8.   Asthma   Albuterol Nebs      9.   Essential hypertension   Continue Amlodipine, Metoprolol      10.   Gout   stable   11.   Chronic pain   On Opana Rx   12.   DVT Prophylaxis   SCDs     Code Status:     FULL CODE        Family Communication:   No Family Present    Disposition Plan:    Inpatient   Status        Time spent:  28 Minutes      Ron Parker [facility] Pager [phone]   If 7AM -7PM Please Contact the Day Rounding Team MD for [facility]  If 7PM-7AM, Please Contact Night-Floor Coverage  [email or web address] Password [email or web address] 11/07/2015, 12:33 AM     ADDENDUM:   Patient was seen and examined on 11/07/2015

## 2015-11-07 NOTE — ED Notes (Signed)
Kathlene November, RN at [facility] 3E for this pt in room 3E07, notified of pt's walker remains in [facility] ED, family did not take it with them. Kathlene November to notify family to come pick it up.

## 2015-11-08 ENCOUNTER — Encounter (HOSPITAL_COMMUNITY): Payer: Self-pay | Admitting: General Practice

## 2015-11-08 DIAGNOSIS — R918 Other nonspecific abnormal finding of lung field: Secondary | ICD-10-CM | POA: Insufficient documentation

## 2015-11-08 DIAGNOSIS — K5909 Other constipation: Secondary | ICD-10-CM

## 2015-11-08 DIAGNOSIS — I10 Essential (primary) hypertension: Secondary | ICD-10-CM

## 2015-11-08 LAB — LEGIONELLA ANTIGEN, URINE

## 2015-11-08 LAB — GLUCOSE, CAPILLARY
GLUCOSE-CAPILLARY: 141 mg/dL — AB (ref 65–99)
Glucose-Capillary: 140 mg/dL — ABNORMAL HIGH (ref 65–99)
Glucose-Capillary: 140 mg/dL — ABNORMAL HIGH (ref 65–99)
Glucose-Capillary: 143 mg/dL — ABNORMAL HIGH (ref 65–99)
Glucose-Capillary: 138 mg/dL — ABNORMAL HIGH (ref 65–99)

## 2015-11-08 LAB — HEMOGLOBIN A1C
Hgb A1c MFr Bld: 5.8 % — ABNORMAL HIGH (ref 4.8–5.6)
Mean Plasma Glucose: 120 mg/dL

## 2015-11-08 LAB — PROCALCITONIN: Procalcitonin: 0.19 ng/mL

## 2015-11-08 NOTE — Progress Notes (Signed)
Initial Nutrition Assessment  INTERVENTION:  Provide Ensure Enlive po BID until appetite improves, each supplement provides 350 kcal and 20 grams of protein Provide snacks BID  NUTRITION DIAGNOSIS:   Inadequate oral intake related to altered GI function, nausea as evidenced by per patient/family report.   GOAL:   Patient will meet greater than or equal to 90% of their needs   MONITOR:   PO intake, Weight trends, I & O's, Labs  REASON FOR ASSESSMENT:   Malnutrition Screening Tool    ASSESSMENT:   59 year old female history of diabetes, hypertension, asthma, degenerative lumbar disease, who presented to emergency department with complaints of chest pain which abated intermittent over the past couple of weeks. Patient did have chest x-ray and was noted to have pneumonia.   Pt states that she has been eating close to nothing for the past 2-3 days due to abdominal pain/bloating due to not having a bowel movement in the past 5 days. She reports eating poorly over the past 2-3 weeks due to being sick. Per nursing notes, pt refused breakfast tray this morning and dinner tray last night; ate 75% of 2 meals yesterday.   Pt states that she usually weighs 152 lbs and has lost 10 lbs within the past month. Per weight history, pt's weight fluctuates between 125 and 152 lbs. Pt appears well nourished.  RD discussed nutrition tips for constipation. Pt likes Ensure Enlive and would like to continue it until appetite returns.  Labs: low potassium, low calcium, low hemoglobin  Wt Readings from Last 7 Encounters:  11/08/15 137 lb 15.5 oz (62.581 kg)  08/24/15 145 lb 15.1 oz (66.2 kg)  08/02/15 146 lb 2.6 oz (66.3 kg)  06/14/15 152 lb 8.9 oz (69.2 kg)  05/25/15 136 lb (61.689 kg)  04/07/15 125 lb (56.7 kg)  01/26/15 130 lb 11.7 oz (59.3 kg)   Diet Order:  Diet Carb Modified Fluid consistency:: Thin; Room service appropriate?: Yes  Skin:  Reviewed, no issues  Last BM:  12/17 per nursing  notes, 5 days ago per pt  Height:   Ht Readings from Last 1 Encounters:  11/06/15 5' (1.524 m)    Weight:   Wt Readings from Last 1 Encounters:  11/08/15 137 lb 15.5 oz (62.581 kg)    Ideal Body Weight:  45.4 kg  BMI:  Body mass index is 26.94 kg/(m^2).  Estimated Nutritional Needs:   Kcal:  1500-1700  Protein:  70-80 grams  Fluid:  1.5-1.7 L/day  EDUCATION NEEDS:   No education needs identified at this time  Dorothea Ogle RD, LDN Inpatient Clinical Dietitian Pager: [phone] After Hours Pager: [phone]

## 2015-11-08 NOTE — Progress Notes (Signed)
PROGRESS NOTE  Seana Stelma EZM:[id number] DOB: July 13, 1956 DOA: 11/06/2015 PCP: [name], MD Brief History 59 y.o. 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 Review of the medical records reveals that the patient has had intermittent chest pain for many months. She has had 2 separate CT scans which have been negative for pulmonary embolus. Most recent echocardiogram on 06/13/2015 showed EF 65%, no wall motion abnormalities. Her chest pain was worsened from a recent fall, and has always been reproducible with palpation. She presented on 11/07/2015 with worsening chest discomfort for the past 3 days prior to admission with associated shortness of breath and nausea and vomiting. The patient had subjective fevers and chills.  Assessment/Plan: Pulmonary infiltrates -differential includes infection, chronic changes, vasculitis -check ANA, ANCA, PCT -continue abx for now -CT chest to clarify -presently stable on RA -cannot r/o degree of chronic aspiration with N/V Atypical chest pain -Likely musculoskeletal -Reproducible on palpation  -EKG without concerning ST-T wave changes -Minimally elevated troponin likely represents demand ischemia  -07/27/2015 VQ scan negative  Abdominal pain and distention  -Suspect constipation given chronic opioid use  -CT abdomen and pelvis  -start cathartics -Lipase 74, normal LFTs Diabetes mellitus type 2  -Discontinue metformin  -Hemoglobin A1c--5.8 -novolog sliding scale Hypertension  -Continue amlodipine and metoprolol succinate  Chronic pain syndrome  -Continue oral equivalent of Opana  -Continue gabapentin and tizanidine  Seizure disorder  -Continue Keppra  Hypokalemia    -Replete  -Check magnesium   Family Communication:   Pt at beside Disposition Plan:   Home when medically stable    Procedures/Studies: Dg Chest 2 View  11/06/2015  CLINICAL DATA:  59 year old female with a history of chest pain and vomiting. EXAM: CHEST - 2 VIEW COMPARISON:  08/24/2015, 07/29/2015, 07/26/2015, 06/11/2015 FINDINGS: Cardiomediastinal silhouette unchanged in size and contour. No evidence of pulmonary vascular congestion. Bilateral interstitial opacities, more pronounced than on the comparison plain film, without confluent airspace disease. No pleural effusion. No pneumothorax. Similar appearance of flattened hemidiaphragms. Posttraumatic changes of the right-sided ribs again noted. Surgical changes of thoracic fixation with corpectomy and interbody spacer. IMPRESSION: Coarsened interstitial opacities, more pronounced than on the comparison study, potentially representing developing infection. No evidence of lobar pneumonia or pleural effusion. Similar appearance of surgical changes of thoracic fixation and corpectomy. Signed, Yvone Neu. Loreta Ave, DO [facility] Radiology Electronically Signed   By: Gilmer Mor D.O.   On: 11/06/2015 20:07   Dg Abd Portable 1v  11/07/2015  CLINICAL DATA:  59 year old female with abdominal distention. EXAM: PORTABLE ABDOMEN - 1 VIEW COMPARISON:  09/27/2015 CT FINDINGS: The bowel gas pattern is unremarkable. There is no evidence of bowel obstruction. Cholecystectomy clips are present. No suspicious calcifications are identified. No acute bony abnormalities are identified. IMPRESSION: No acute abnormalities. Electronically Signed   By: Harmon Pier M.D.   On: 11/07/2015 07:42         Subjective: Patient complains of "pain all over ".  She states her last bowel movement 4 days prior to admission. Denies any vomiting, diarrhea, dysuria, hematuria. Denies any fevers, chills, headache, neck pain.    Objective: Filed Vitals:   11/08/15 0051 11/08/15 0452 11/08/15 1028 11/08/15 1116  BP:  117/77 127/63 109/73 111/81  Pulse: 126 108 108 109  Temp:  98 F (36.7 C) 98.5 F (36.9 C) 97.7 F (36.5 C)  TempSrc:  Oral  Oral  Resp: 16 19 18 18   Height:      Weight:  62.581 kg (137 lb 15.5 oz)    SpO2: 100% 100% 98% 98%    Intake/Output Summary (Last 24 hours) at 11/08/15 1817 Last data filed at 11/08/15 1747  Gross per 24 hour  Intake    913 ml  Output   1100 ml  Net   -187 ml   Weight change: -1.83 kg (-4 lb 0.5 oz) Exam:   General:  Pt is alert, follows commands appropriately, not in acute distress  HEENT: No icterus, No thrush, No neck mass, [place]/AT  Cardiovascular: RRR, S1/S2, no rubs, no gallops  Respiratory: CTA bilaterally, no wheezing, no crackles, no rhonchi  Abdomen: Soft/+BS, diffusely tender without rebound, mildly distended, no guarding; no HSM  Extremities: 1+LE edema, No lymphangitis, No petechiae, No rashes, no synovitis  Data Reviewed: Basic Metabolic Panel:  Recent Labs Lab 11/06/15 1945 11/07/15 0650  NA 138 139  K 3.3* 3.3*  CL 103 109  CO2 25 23  GLUCOSE 117* 120*  BUN 15 9  CREATININE 0.93 0.85  CALCIUM 8.9 8.3*   Liver Function Tests: No results for input(s): AST, ALT, ALKPHOS, BILITOT, PROT, ALBUMIN in the last 168 hours. No results for input(s): LIPASE, AMYLASE in the last 168 hours. No results for input(s): AMMONIA in the last 168 hours. CBC:  Recent Labs Lab 11/06/15 1945 11/07/15 0650  WBC 9.8 6.2  HGB 10.9* 8.9*  HCT 34.0* 28.5*  MCV 77.3* 79.6  PLT 279 202   Cardiac Enzymes:  Recent Labs Lab 11/06/15 1945 11/07/15 0136 11/07/15 0650 11/07/15 1312  TROPONINI 0.06* 0.06* 0.05* 0.05*   BNP: Invalid input(s): POCBNP CBG:  Recent Labs Lab 11/07/15 2208 11/08/15 0632 11/08/15 1115 11/08/15 1251 11/08/15 1642  GLUCAP 141* 140* 140* 143* 138*    No results found for this or any previous visit (from the  past 240 hour(s)).   Scheduled Meds: . allopurinol  300 mg Oral Daily  . amLODipine  5 mg Oral Daily  . aspirin EC  325 mg Oral Daily  . atorvastatin  10 mg Oral Daily  . ceFEPime (MAXIPIME) IV  1 g Intravenous 3 times per day  . docusate sodium  100 mg Oral BID  . enoxaparin (LOVENOX) injection  40 mg Subcutaneous Q24H  . feeding supplement (ENSURE ENLIVE)  237 mL Oral BID BM  . folic acid  1 mg Oral Daily  . furosemide  40 mg Oral Daily  . gabapentin  300 mg Oral BID  . insulin aspart  0-5 Units Subcutaneous QHS  . insulin aspart  0-9 Units Subcutaneous TID WC  . levETIRAcetam  500 mg Oral BID  . metoprolol succinate  25 mg Oral Daily  . morphine  100 mg Oral Q12H  . nitroGLYCERIN  0.5 inch Topical 4 times per day  . pantoprazole  40 mg Oral Daily  . polyethylene glycol  17 g Oral BID  . potassium chloride SA  40 mEq Oral Daily  . sodium chloride  3 mL Intravenous Q12H  . vancomycin  500 mg Intravenous Q12H  . vancomycin  1,000 mg Intravenous Once   Continuous Infusions:    Griffin Dewilde, DO  [facility] Pager [phone]  If 7PM-7AM, please contact night-coverage [email or web address] Password [facility] 11/08/2015,  6:17 PM   LOS: 2 days

## 2015-11-09 ENCOUNTER — Inpatient Hospital Stay (HOSPITAL_COMMUNITY): Payer: Medicaid Other

## 2015-11-09 DIAGNOSIS — M79606 Pain in leg, unspecified: Secondary | ICD-10-CM

## 2015-11-09 LAB — COMPREHENSIVE METABOLIC PANEL
ALBUMIN: 2.6 g/dL — AB (ref 3.5–5.0)
ALT: 20 U/L (ref 14–54)
ANION GAP: 9 (ref 5–15)
AST: 32 U/L (ref 15–41)
Alkaline Phosphatase: 106 U/L (ref 38–126)
BUN: 11 mg/dL (ref 6–20)
CO2: 20 mmol/L — AB (ref 22–32)
Calcium: 9.1 mg/dL (ref 8.9–10.3)
Chloride: 106 mmol/L (ref 101–111)
Creatinine, Ser: 0.97 mg/dL (ref 0.44–1.00)
GFR calc Af Amer: >60 mL/min (ref >60)
GFR calc non Af Amer: >60 mL/min (ref >60)
GLUCOSE: 141 mg/dL — AB (ref 65–99)
POTASSIUM: 4 mmol/L (ref 3.5–5.1)
SODIUM: 135 mmol/L (ref 135–145)
TOTAL PROTEIN: 5.9 g/dL — AB (ref 6.5–8.1)
Total Bilirubin: 0.9 mg/dL (ref 0.3–1.2)

## 2015-11-09 LAB — CBC
HEMATOCRIT: 30.6 % — AB (ref 36.0–46.0)
HEMOGLOBIN: 9.3 g/dL — AB (ref 12.0–15.0)
MCH: 24.3 pg — AB (ref 26.0–34.0)
MCHC: 30.4 g/dL (ref 30.0–36.0)
MCV: 80.1 fL (ref 78.0–100.0)
Platelets: 186 10*3/uL (ref 150–400)
RBC: 3.82 MIL/uL — ABNORMAL LOW (ref 3.87–5.11)
RDW: 16.3 % — ABNORMAL HIGH (ref 11.5–15.5)
WBC: 6.2 10*3/uL (ref 4.0–10.5)

## 2015-11-09 LAB — GLUCOSE, CAPILLARY
GLUCOSE-CAPILLARY: 147 mg/dL — AB (ref 65–99)
GLUCOSE-CAPILLARY: 98 mg/dL (ref 65–99)
Glucose-Capillary: 147 mg/dL — ABNORMAL HIGH (ref 65–99)
Glucose-Capillary: 133 mg/dL — ABNORMAL HIGH (ref 65–99)

## 2015-11-09 LAB — MAGNESIUM: Magnesium: 1.5 mg/dL — ABNORMAL LOW (ref 1.7–2.4)

## 2015-11-09 MED ORDER — MAGNESIUM SULFATE 2 GM/50ML IV SOLN
2.0000 g | Freq: Once | INTRAVENOUS | Status: AC
  Administered 2015-11-09: 2 g via INTRAVENOUS
  Filled 2015-11-09: qty 50

## 2015-11-09 MED ORDER — AMOXICILLIN-POT CLAVULANATE 875-125 MG PO TABS
1.0000 | ORAL_TABLET | Freq: Two times a day (BID) | ORAL | Status: DC
  Administered 2015-11-09 – 2015-11-10 (×2): 1 via ORAL
  Filled 2015-11-09 (×2): qty 1

## 2015-11-09 MED ORDER — BISACODYL 10 MG RE SUPP
10.0000 mg | Freq: Once | RECTAL | Status: AC
  Administered 2015-11-09: 10 mg via RECTAL
  Filled 2015-11-09: qty 1

## 2015-11-09 NOTE — Progress Notes (Signed)
VASCULAR LAB PRELIMINARY  PRELIMINARY  PRELIMINARY  PRELIMINARY  Bilateral lower extremity venous duplex  completed.    Preliminary report:  Bilateral:  No evidence of DVT, superficial thrombosis, or Baker's Cyst.    Treacy Holcomb, RVT 11/09/2015, 3:45 PM

## 2015-11-09 NOTE — Progress Notes (Signed)
PROGRESS NOTE  Amber [name] DXA:[id number] DOB: [birth year] DOA: 11/06/2015 PCP: [name], MD  Brief History 59 y.o. 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 Review of the medical records reveals that the patient has had intermittent chest pain for many months. She has had 2 separate CT scans which have been negative for pulmonary embolus. Most recent echocardiogram on 06/13/2015 showed EF 65%, no wall motion abnormalities. Her chest pain was worsened from a recent fall, and has always been reproducible with palpation. She presented on 11/07/2015 with worsening chest discomfort for the past 3 days prior to admission with associated shortness of breath and nausea and vomiting. The patient had subjective fevers and chills.  Assessment/Plan: Pulmonary infiltrates -differential includes infection, chronic changes, vasculitis -check ANA, ANCA--pending -PCT--0.19 -CT chest-- negative for lobar infiltrates, pulmonary edema, pleural effusion but showed chronic subpleural reticulation , likely rheumatic scarring -presently stable on RA -cannot r/o degree of chronic aspiration with N/V -d/c vancomycin and cefepime -start augmentin Atypical chest pain -Likely musculoskeletal -Reproducible on palpation  -EKG without concerning ST-T wave changes -Minimally elevated troponin likely represents demand ischemia  - 06/12/2015 ,07/27/2015 VQ scan negative  Abdominal pain and distention  -Suspect constipation given chronic opioid use  -CT abdomen and pelvis-- negative for acute findings , no bowel obstruction, positive diverticulosis -start cathartics -Lipase 74, normal LFTs Diabetes mellitus type 2  -Discontinue metformin   -Hemoglobin A1c--5.8 -novolog sliding scale Hypertension  -Continue amlodipine and metoprolol succinate  Chronic pain syndrome  -Continue oral equivalent of Opana  -Continue gabapentin and tizanidine  Seizure disorder  -Continue Keppra  Hypokalemia/ hypomagnesemia  -Replete    Family Communication: Pt at beside Disposition Plan: Home 12/21 if stable      Procedures/Studies: Ct Abdomen Pelvis Wo Contrast  11/09/2015  CLINICAL DATA:  Pulmonary infiltrates. Chest pain with vomiting for 3 days. EXAM: CT CHEST, ABDOMEN AND PELVIS WITHOUT CONTRAST TECHNIQUE: Multidetector CT imaging of the chest, abdomen and pelvis was performed following the standard protocol without IV contrast. COMPARISON:  01/25/2015 chest CT.  Abdominal CT 09/27/2015 FINDINGS: CT CHEST THORACIC INLET/BODY WALL: No acute abnormality. MEDIASTINUM: Normal heart size. No pericardial effusion. No acute vascular abnormality. Gastroesophageal reflux or poor clearance. LUNG WINDOWS: There is chronic subpleural reticulation at the bases, likely posttraumatic scarring given the osseous findings. There is also mild linear scarring in the right upper lobe. Subtle patchy ground-glass density in the right lower lobe is favored to reflect atelectasis given lung volumes appear lower. There is no definitive edema, consolidation, effusion, or pneumothorax. OSSEOUS: See below CT ABDOMEN AND PELVIS Abdominal wall:  No contributory findings. Hepatobiliary: No focal liver abnormality.Cholecystectomy with normal common bile duct. Pancreas: Atrophy with scattered coarse calcifications, some potentially ductal. No active inflammation or visible duct enlargement. Spleen: Unremarkable. Adrenals/Urinary Tract: Negative adrenals. No hydronephrosis or stone. Unremarkable bladder. Reproductive:No pathologic findings. Stomach/Bowel: No obstruction. No appendicitis. Colonic diverticulosis. Vascular/Lymphatic: No acute vascular abnormality. No mass  or adenopathy. Peritoneal: No ascites or pneumoperitoneum. Musculoskeletal: T4 and T5 corpectomy with solid bony fusion. Extensive bridging posterior fixation. Remote T11 inferior endplate fracture. Lumbar disc narrowing and bulging, greatest at L4-5 where there is also facet arthropathy. No acute osseous finding. IMPRESSION: 1. No acute finding in  the chest or abdomen. 2. Chronic mild lung scarring. 3. Chronic pancreatitis. Electronically Signed   By: Marnee Spring M.D.   On: 11/09/2015 05:04   Dg Chest 2 View  11/06/2015  CLINICAL DATA:  59 year old female with a history of chest pain and vomiting. EXAM: CHEST - 2 VIEW COMPARISON:  08/24/2015, 07/29/2015, 07/26/2015, 06/11/2015 FINDINGS: Cardiomediastinal silhouette unchanged in size and contour. No evidence of pulmonary vascular congestion. Bilateral interstitial opacities, more pronounced than on the comparison plain film, without confluent airspace disease. No pleural effusion. No pneumothorax. Similar appearance of flattened hemidiaphragms. Posttraumatic changes of the right-sided ribs again noted. Surgical changes of thoracic fixation with corpectomy and interbody spacer. IMPRESSION: Coarsened interstitial opacities, more pronounced than on the comparison study, potentially representing developing infection. No evidence of lobar pneumonia or pleural effusion. Similar appearance of surgical changes of thoracic fixation and corpectomy. Signed, Yvone Neu. Loreta Ave, DO [facility] Electronically Signed   By: Gilmer Mor D.O.   On: 11/06/2015 20:07   Ct Chest Wo Contrast  11/09/2015  CLINICAL DATA:  Pulmonary infiltrates. Chest pain with vomiting for 3 days. EXAM: CT CHEST, ABDOMEN AND PELVIS WITHOUT CONTRAST TECHNIQUE: Multidetector CT imaging of the chest, abdomen and pelvis was performed following the standard protocol without IV contrast. COMPARISON:  01/25/2015 chest CT.  Abdominal CT  09/27/2015 FINDINGS: CT CHEST THORACIC INLET/BODY WALL: No acute abnormality. MEDIASTINUM: Normal heart size. No pericardial effusion. No acute vascular abnormality. Gastroesophageal reflux or poor clearance. LUNG WINDOWS: There is chronic subpleural reticulation at the bases, likely posttraumatic scarring given the osseous findings. There is also mild linear scarring in the right upper lobe. Subtle patchy ground-glass density in the right lower lobe is favored to reflect atelectasis given lung volumes appear lower. There is no definitive edema, consolidation, effusion, or pneumothorax. OSSEOUS: See below CT ABDOMEN AND PELVIS Abdominal wall:  No contributory findings. Hepatobiliary: No focal liver abnormality.Cholecystectomy with normal common bile duct. Pancreas: Atrophy with scattered coarse calcifications, some potentially ductal. No active inflammation or visible duct enlargement. Spleen: Unremarkable. Adrenals/Urinary Tract: Negative adrenals. No hydronephrosis or stone. Unremarkable bladder. Reproductive:No pathologic findings. Stomach/Bowel: No obstruction. No appendicitis. Colonic diverticulosis. Vascular/Lymphatic: No acute vascular abnormality. No mass or adenopathy. Peritoneal: No ascites or pneumoperitoneum. Musculoskeletal: T4 and T5 corpectomy with solid bony fusion. Extensive bridging posterior fixation. Remote T11 inferior endplate fracture. Lumbar disc narrowing and bulging, greatest at L4-5 where there is also facet arthropathy. No acute osseous finding. IMPRESSION: 1. No acute finding in the chest or abdomen. 2. Chronic mild lung scarring. 3. Chronic pancreatitis. Electronically Signed   By: Marnee Spring M.D.   On: 11/09/2015 05:04   Dg Abd Portable 1v  11/07/2015  CLINICAL DATA:  59 year old female with abdominal distention. EXAM: PORTABLE ABDOMEN - 1 VIEW COMPARISON:  09/27/2015 CT FINDINGS: The bowel gas pattern is unremarkable. There is no evidence of bowel obstruction. Cholecystectomy  clips are present. No suspicious calcifications are identified. No acute bony abnormalities are identified. IMPRESSION: No acute abnormalities. Electronically Signed   By: Harmon Pier M.D.   On: 11/07/2015 07:42         Subjective:  patient complains of "pain all over". Continues to have chest pain, abdominal pain, back pain, leg pain. Denies any fevers, chills, headache, dysuria, hematuria. Medications melena.  Objective: Filed Vitals:   11/08/15 1116 11/08/15 2031 11/09/15 0357 11/09/15 1159  BP: 111/81 119/75 130/85 101/64  Pulse: 109 100 115 95  Temp: 97.7 F (36.5 C)  98.7 F (37.1 C) 98.4 F (36.9 C) 98.3 F (36.8 C)  TempSrc: Oral Oral Oral Oral  Resp: 18 18 18 19   Height:      Weight:   61.78 kg (136 lb 3.2 oz)   SpO2: 98% 98% 98% 97%    Intake/Output Summary (Last 24 hours) at 11/09/15 1528 Last data filed at 11/09/15 1235  Gross per 24 hour  Intake   1320 ml  Output   2150 ml  Net   -830 ml   Weight change: -0.801 kg (-1 lb 12.3 oz) Exam:   General:  Pt is alert, follows commands appropriately, not in acute distress  HEENT: No icterus, No thrush, No neck mass, [place]/AT  Cardiovascular: RRR, S1/S2, no rubs, no gallops  Respiratory:  Bibasilar crackles, right greater than left. No wheeze  Abdomen: Soft/+BS, non tender, non distended, no guarding;  No hepatosplenomegaly  Extremities: 1+ LE edema, No lymphangitis, No petechiae, No rashes, no synovitis;  Clubbing without cyanosis  Data Reviewed: Basic Metabolic Panel:  Recent Labs Lab 11/06/15 1945 11/07/15 0650 11/09/15 0755  NA 138 139 135  K 3.3* 3.3* 4.0  CL 103 109 106  CO2 25 23 20*  GLUCOSE 117* 120* 141*  BUN 15 9 11   CREATININE 0.93 0.85 0.97  CALCIUM 8.9 8.3* 9.1  MG  --   --  1.5*   Liver Function Tests:  Recent Labs Lab 11/09/15 0755  AST 32  ALT 20  ALKPHOS 106  BILITOT 0.9  PROT 5.9*  ALBUMIN 2.6*   No results for input(s): LIPASE, AMYLASE in the last 168 hours. No  results for input(s): AMMONIA in the last 168 hours. CBC:  Recent Labs Lab 11/06/15 1945 11/07/15 0650 11/09/15 0755  WBC 9.8 6.2 6.2  HGB 10.9* 8.9* 9.3*  HCT 34.0* 28.5* 30.6*  MCV 77.3* 79.6 80.1  PLT 279 202 186   Cardiac Enzymes:  Recent Labs Lab 11/06/15 1945 11/07/15 0136 11/07/15 0650 11/07/15 1312  TROPONINI 0.06* 0.06* 0.05* 0.05*   BNP: Invalid input(s): POCBNP CBG:  Recent Labs Lab 11/08/15 1251 11/08/15 1642 11/08/15 2103 11/09/15 0535 11/09/15 1203  GLUCAP 143* 138* 141* 147* 147*    No results found for this or any previous visit (from the past 240 hour(s)).   Scheduled Meds: . allopurinol  300 mg Oral Daily  . amLODipine  5 mg Oral Daily  . amoxicillin-clavulanate  1 tablet Oral Q12H  . aspirin EC  325 mg Oral Daily  . atorvastatin  10 mg Oral Daily  . bisacodyl  10 mg Rectal Once  . docusate sodium  100 mg Oral BID  . enoxaparin (LOVENOX) injection  40 mg Subcutaneous Q24H  . feeding supplement (ENSURE ENLIVE)  237 mL Oral BID BM  . folic acid  1 mg Oral Daily  . furosemide  40 mg Oral Daily  . gabapentin  300 mg Oral BID  . insulin aspart  0-5 Units Subcutaneous QHS  . insulin aspart  0-9 Units Subcutaneous TID WC  . levETIRAcetam  500 mg Oral BID  . metoprolol succinate  25 mg Oral Daily  . morphine  100 mg Oral Q12H  . nitroGLYCERIN  0.5 inch Topical 4 times per day  . pantoprazole  40 mg Oral Daily  . polyethylene glycol  17 g Oral BID  . potassium chloride SA  40 mEq Oral Daily  . sodium chloride  3 mL Intravenous Q12H  . vancomycin  1,000 mg Intravenous Once  Continuous Infusions:    Khya Halls, DO  [facility] Pager [phone]  If 7PM-7AM, please contact night-coverage [email or web address] Password [email or web address] 11/09/2015, 3:28 PM   LOS: 3 days

## 2015-11-10 LAB — BASIC METABOLIC PANEL
ANION GAP: 9 (ref 5–15)
BUN: 12 mg/dL (ref 6–20)
CO2: 21 mmol/L — AB (ref 22–32)
Calcium: 9.2 mg/dL (ref 8.9–10.3)
Chloride: 106 mmol/L (ref 101–111)
Creatinine, Ser: 1.04 mg/dL — ABNORMAL HIGH (ref 0.44–1.00)
GFR calc Af Amer: >60 mL/min (ref >60)
GFR calc non Af Amer: 58 mL/min — ABNORMAL LOW (ref >60)
GLUCOSE: 117 mg/dL — AB (ref 65–99)
POTASSIUM: 3.9 mmol/L (ref 3.5–5.1)
Sodium: 136 mmol/L (ref 135–145)

## 2015-11-10 LAB — GLUCOSE, CAPILLARY
GLUCOSE-CAPILLARY: 160 mg/dL — AB (ref 65–99)
Glucose-Capillary: 109 mg/dL — ABNORMAL HIGH (ref 65–99)

## 2015-11-10 LAB — PROCALCITONIN: Procalcitonin: 0.14 ng/mL

## 2015-11-10 LAB — FANA STAINING PATTERNS: Speckled Pattern: 1:160 {titer} — ABNORMAL HIGH

## 2015-11-10 LAB — MAGNESIUM: Magnesium: 2 mg/dL (ref 1.7–2.4)

## 2015-11-10 LAB — ANCA TITERS: C-ANCA: <1:20 {titer}

## 2015-11-10 LAB — ANTINUCLEAR ANTIBODIES, IFA: ANA Ab, IFA: POSITIVE — AB

## 2015-11-10 MED ORDER — ENSURE ENLIVE PO LIQD: 237.0000 mL | Freq: Two times a day (BID) | ORAL | Status: DC

## 2015-11-10 MED ORDER — AMOXICILLIN-POT CLAVULANATE 875-125 MG PO TABS: 1.0000 | ORAL_TABLET | Freq: Two times a day (BID) | ORAL | Status: DC

## 2015-11-10 NOTE — Discharge Summary (Signed)
Physician Discharge Summary  Amber [name] BZJ:[id number] DOB: 1956/04/29 DOA: 11/06/2015  PCP: [name], MD  Admit date: 11/06/2015 Discharge date: 11/10/2015  Recommendations for Outpatient Follow-up:  1. Pt will need to follow up with PCP in 2 weeks post discharge 2. Please obtain BMP 1-2 weeks 3. Please follow up on ANA and ANCA Discharge Diagnoses:  Pulmonary infiltrates -differential includes infection, chronic changes, vasculitis -check ANA, ANCA--pending -PCT--0.19 -CT chest-- negative for lobar infiltrates, pulmonary edema, pleural effusion but showed chronic subpleural reticulation , likely rheumatic scarring -presently stable on RA -cannot r/o degree of chronic aspiration with N/V - patient remained stable on room air -d/c vancomycin and cefepime -start augmentin--d/c with 3 more days -pt remained stable off of prednisone during the entire hospitalization - recommend PFTs in the outpatient setting Atypical chest pain -Likely musculoskeletal -Reproducible on palpation  -EKG without concerning ST-T wave changes -Minimally elevated troponin likely represents demand ischemia  - 06/12/2015 ,07/27/2015 VQ scan negative  Abdominal pain and distention  -Suspect constipation given chronic opioid use  -CT abdomen and pelvis-- negative for acute findings , no bowel obstruction, positive diverticulosis -start cathartics--improved with BM -Lipase 74, normal LFTs Diabetes mellitus type 2  -Discontinue metformin during the hospitalization--resume after d/c -Hemoglobin A1c--5.8 -novolog sliding scale Hypertension  -Continue amlodipine and metoprolol succinate  Chronic pain syndrome  -Continue oral equivalent of Opana  -Continue gabapentin and tizanidine  Seizure disorder  -Continue Keppra  Hypokalemia/ hypomagnesemia  -Repleted  Discharge Condition: stable  Disposition: home  Diet:carb modified Wt Readings from Last 3 Encounters:  11/09/15 61.78 kg  (136 lb 3.2 oz)  08/24/15 66.2 kg (145 lb 15.1 oz)  08/02/15 66.3 kg (146 lb 2.6 oz)    History of present illness:  59 y.o. 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 Review of the medical records reveals that the patient has had intermittent chest pain for many months. She has had 2 separate CT scans which have been negative for pulmonary embolus. Most recent echocardiogram on 06/13/2015 showed EF 65%, no wall motion abnormalities. Her chest pain was worsened from a recent fall, and has always been reproducible with palpation. She presented on 11/07/2015 with worsening chest discomfort for the past 3 days prior to admission with associated shortness of breath and nausea and vomiting. The patient had subjective fevers and chills. The patient was started on intravenous vancomycin and cefepime. Further workup including CT of the chest did not reveal any lobar consolidation. The patient was transitioned to oral Augmentin and remained stable with oral therapy. The patient remained stable on room air. The patient was not  Placed on prednisone and remained clinically stable for the entire hospitalization. Would recommend outpatient PFTs when clinically stable.  Discharge Exam: Filed Vitals:   11/09/15 2149 11/10/15 1115  BP: 122/81 118/75  Pulse: 112 103  Temp: 99.6 F (37.6 C) 98.8 F (37.1 C)  Resp: 19 18   Filed Vitals:   11/09/15 0357 11/09/15 1159 11/09/15 2149 11/10/15 1115  BP: 130/85 101/64 122/81 118/75  Pulse: 115 95 112 103  Temp: 98.4 F (36.9 C) 98.3 F (36.8 C) 99.6 F (37.6 C) 98.8 F (37.1 C)  TempSrc: Oral Oral Oral Oral  Resp: 18 19 19 18   Height:  Weight: 61.78 kg (136 lb 3.2 oz)     SpO2: 98% 97% 95% 96%     General: A&O x 3, NAD, pleasant, cooperative Cardiovascular: RRR, no rub, no gallop, no S3 Respiratory:  Bibasilar rales without wheezing Abdomen:soft, nontender, nondistended, positive bowel sounds Extremities: trace LE edema, No lymphangitis, no petechiae  Discharge Instructions      Discharge Instructions    Diet - low sodium heart healthy    Complete by:  As directed      Increase activity slowly    Complete by:  As directed             Medication List    STOP taking these medications        doxycycline 50 MG capsule  Commonly known as:  VIBRAMYCIN     predniSONE 5 MG tablet  Commonly known as:  DELTASONE      TAKE these medications        albuterol 108 (90 BASE) MCG/ACT inhaler  Commonly known as:  PROVENTIL HFA;VENTOLIN HFA  Inhale 2 puffs into the lungs every 6 (six) hours as needed for wheezing or shortness of breath.     amLODipine 5 MG tablet  Commonly known as:  NORVASC  Take 5 mg by mouth daily.     amoxicillin-clavulanate 875-125 MG tablet  Commonly known as:  AUGMENTIN  Take 1 tablet by mouth every 12 (twelve) hours.     aspirin EC 81 MG tablet  Take 1 tablet (81 mg total) by mouth daily.     atorvastatin 10 MG tablet  Commonly known as:  LIPITOR  Take 10 mg by mouth daily.     diazepam 5 MG tablet  Commonly known as:  VALIUM  Take 1 tablet (5 mg total) by mouth every 12 (twelve) hours as needed for muscle spasms.     esomeprazole 40 MG capsule  Commonly known as:  NEXIUM  Take 40 mg by mouth daily at 12 noon.     feeding supplement (ENSURE ENLIVE) Liqd  Take 237 mLs by mouth 2 (two) times daily between meals.     Fluticasone-Salmeterol 250-50 MCG/DOSE Aepb  Commonly known as:  ADVAIR  Inhale 1 puff into the lungs 2 (two) times daily.     folic acid 1 MG tablet  Commonly known as:  FOLVITE  Take 1 mg by mouth daily.     furosemide 40 MG tablet  Commonly known as:  LASIX  Take 1 tablet (40 mg total) by mouth daily.      gabapentin 300 MG capsule  Commonly known as:  NEURONTIN  Take 1 capsule (300 mg total) by mouth 2 (two) times daily.     LINZESS 145 MCG Caps capsule  Generic drug:  Linaclotide  Take 145 mcg by mouth daily as needed.     metFORMIN 500 MG 24 hr tablet  Commonly known as:  GLUCOPHAGE-XR  Take 500 mg by mouth daily with breakfast.     metoprolol succinate 25 MG 24 hr tablet  Commonly known as:  TOPROL-XL  Take 25 mg by mouth daily.     Oxycodone HCl 20 MG Tabs  Take 20 mg by mouth every 4 (four) hours.     oxymorphone 40 MG 12 hr tablet  Commonly known as:  OPANA ER  Take 40 mg by mouth every 12 (twelve) hours.     polyethylene glycol packet  Commonly known as:  MIRALAX / GLYCOLAX  Take 17 g by mouth 2 (two)  times daily.     potassium chloride SA 20 MEQ tablet  Commonly known as:  K-DUR,KLOR-CON  Take 2 tablets (40 mEq total) by mouth daily.     tiZANidine 4 MG tablet  Commonly known as:  ZANAFLEX  Take 4 mg by mouth 4 (four) times daily.     Vitamin D (Ergocalciferol) 50000 UNITS Caps capsule  Commonly known as:  DRISDOL  Take 1 capsule (50,000 Units total) by mouth every 7 (seven) days. Sunday     VITAMIN E PO  Take 1 capsule by mouth daily.         The results of significant diagnostics from this hospitalization (including imaging, microbiology, ancillary and laboratory) are listed below for reference.    Significant Diagnostic Studies: Ct Abdomen Pelvis Wo Contrast  11/09/2015  CLINICAL DATA:  Pulmonary infiltrates. Chest pain with vomiting for 3 days. EXAM: CT CHEST, ABDOMEN AND PELVIS WITHOUT CONTRAST TECHNIQUE: Multidetector CT imaging of the chest, abdomen and pelvis was performed following the standard protocol without IV contrast. COMPARISON:  01/25/2015 chest CT.  Abdominal CT 09/27/2015 FINDINGS: CT CHEST THORACIC INLET/BODY WALL: No acute abnormality. MEDIASTINUM: Normal heart size. No pericardial effusion. No acute vascular abnormality. Gastroesophageal  reflux or poor clearance. LUNG WINDOWS: There is chronic subpleural reticulation at the bases, likely posttraumatic scarring given the osseous findings. There is also mild linear scarring in the right upper lobe. Subtle patchy ground-glass density in the right lower lobe is favored to reflect atelectasis given lung volumes appear lower. There is no definitive edema, consolidation, effusion, or pneumothorax. OSSEOUS: See below CT ABDOMEN AND PELVIS Abdominal wall:  No contributory findings. Hepatobiliary: No focal liver abnormality.Cholecystectomy with normal common bile duct. Pancreas: Atrophy with scattered coarse calcifications, some potentially ductal. No active inflammation or visible duct enlargement. Spleen: Unremarkable. Adrenals/Urinary Tract: Negative adrenals. No hydronephrosis or stone. Unremarkable bladder. Reproductive:No pathologic findings. Stomach/Bowel: No obstruction. No appendicitis. Colonic diverticulosis. Vascular/Lymphatic: No acute vascular abnormality. No mass or adenopathy. Peritoneal: No ascites or pneumoperitoneum. Musculoskeletal: T4 and T5 corpectomy with solid bony fusion. Extensive bridging posterior fixation. Remote T11 inferior endplate fracture. Lumbar disc narrowing and bulging, greatest at L4-5 where there is also facet arthropathy. No acute osseous finding. IMPRESSION: 1. No acute finding in the chest or abdomen. 2. Chronic mild lung scarring. 3. Chronic pancreatitis. Electronically Signed   By: Marnee Spring M.D.   On: 11/09/2015 05:04   Dg Chest 2 View  11/06/2015  CLINICAL DATA:  59 year old female with a history of chest pain and vomiting. EXAM: CHEST - 2 VIEW COMPARISON:  08/24/2015, 07/29/2015, 07/26/2015, 06/11/2015 FINDINGS: Cardiomediastinal silhouette unchanged in size and contour. No evidence of pulmonary vascular congestion. Bilateral interstitial opacities, more pronounced than on the comparison plain film, without confluent airspace disease. No pleural  effusion. No pneumothorax. Similar appearance of flattened hemidiaphragms. Posttraumatic changes of the right-sided ribs again noted. Surgical changes of thoracic fixation with corpectomy and interbody spacer. IMPRESSION: Coarsened interstitial opacities, more pronounced than on the comparison study, potentially representing developing infection. No evidence of lobar pneumonia or pleural effusion. Similar appearance of surgical changes of thoracic fixation and corpectomy. Signed, Yvone Neu. Loreta Ave, DO [facility] Electronically Signed   By: Gilmer Mor D.O.   On: 11/06/2015 20:07   Ct Chest Wo Contrast  11/09/2015  CLINICAL DATA:  Pulmonary infiltrates. Chest pain with vomiting for 3 days. EXAM: CT CHEST, ABDOMEN AND PELVIS WITHOUT CONTRAST TECHNIQUE: Multidetector CT imaging of the chest, abdomen and pelvis  was performed following the standard protocol without IV contrast. COMPARISON:  01/25/2015 chest CT.  Abdominal CT 09/27/2015 FINDINGS: CT CHEST THORACIC INLET/BODY WALL: No acute abnormality. MEDIASTINUM: Normal heart size. No pericardial effusion. No acute vascular abnormality. Gastroesophageal reflux or poor clearance. LUNG WINDOWS: There is chronic subpleural reticulation at the bases, likely posttraumatic scarring given the osseous findings. There is also mild linear scarring in the right upper lobe. Subtle patchy ground-glass density in the right lower lobe is favored to reflect atelectasis given lung volumes appear lower. There is no definitive edema, consolidation, effusion, or pneumothorax. OSSEOUS: See below CT ABDOMEN AND PELVIS Abdominal wall:  No contributory findings. Hepatobiliary: No focal liver abnormality.Cholecystectomy with normal common bile duct. Pancreas: Atrophy with scattered coarse calcifications, some potentially ductal. No active inflammation or visible duct enlargement. Spleen: Unremarkable. Adrenals/Urinary Tract:  Negative adrenals. No hydronephrosis or stone. Unremarkable bladder. Reproductive:No pathologic findings. Stomach/Bowel: No obstruction. No appendicitis. Colonic diverticulosis. Vascular/Lymphatic: No acute vascular abnormality. No mass or adenopathy. Peritoneal: No ascites or pneumoperitoneum. Musculoskeletal: T4 and T5 corpectomy with solid bony fusion. Extensive bridging posterior fixation. Remote T11 inferior endplate fracture. Lumbar disc narrowing and bulging, greatest at L4-5 where there is also facet arthropathy. No acute osseous finding. IMPRESSION: 1. No acute finding in the chest or abdomen. 2. Chronic mild lung scarring. 3. Chronic pancreatitis. Electronically Signed   By: Marnee Spring M.D.   On: 11/09/2015 05:04   Dg Abd Portable 1v  11/07/2015  CLINICAL DATA:  59 year old female with abdominal distention. EXAM: PORTABLE ABDOMEN - 1 VIEW COMPARISON:  09/27/2015 CT FINDINGS: The bowel gas pattern is unremarkable. There is no evidence of bowel obstruction. Cholecystectomy clips are present. No suspicious calcifications are identified. No acute bony abnormalities are identified. IMPRESSION: No acute abnormalities. Electronically Signed   By: Harmon Pier M.D.   On: 11/07/2015 07:42     Microbiology: No results found for this or any previous visit (from the past 240 hour(s)).   Labs: Basic Metabolic Panel:  Recent Labs Lab 11/06/15 1945 11/07/15 0650 11/09/15 0755 11/10/15 0515  NA 138 139 135 136  K 3.3* 3.3* 4.0 3.9  CL 103 109 106 106  CO2 25 23 20* 21*  GLUCOSE 117* 120* 141* 117*  BUN 15 9 11 12   CREATININE 0.93 0.85 0.97 1.04*  CALCIUM 8.9 8.3* 9.1 9.2  MG  --   --  1.5* 2.0   Liver Function Tests:  Recent Labs Lab 11/09/15 0755  AST 32  ALT 20  ALKPHOS 106  BILITOT 0.9  PROT 5.9*  ALBUMIN 2.6*   No results for input(s): LIPASE, AMYLASE in the last 168 hours. No results for input(s): AMMONIA in the last 168 hours. CBC:  Recent Labs Lab 11/06/15 1945  11/07/15 0650 11/09/15 0755  WBC 9.8 6.2 6.2  HGB 10.9* 8.9* 9.3*  HCT 34.0* 28.5* 30.6*  MCV 77.3* 79.6 80.1  PLT 279 202 186   Cardiac Enzymes:  Recent Labs Lab 11/06/15 1945 11/07/15 0136 11/07/15 0650 11/07/15 1312  TROPONINI 0.06* 0.06* 0.05* 0.05*   BNP: Invalid input(s): POCBNP CBG:  Recent Labs Lab 11/09/15 1203 11/09/15 1637 11/09/15 2249 11/10/15 0546 11/10/15 1113  GLUCAP 147* 133* 98 109* 160*    Time coordinating discharge:  Greater than 30 minutes  Signed:  Karl Erway, DO [facility] Pager: [phone] 11/10/2015, 12:09 PM

## 2015-11-10 NOTE — Evaluation (Signed)
Physical Therapy Evaluation Patient Details Name: Amber [name] MRN: [id number] DOB: December 10, 1955 Today's Date: 11/10/2015   History of Present Illness  Amber [name] is a 59 y.o. female with a history of DM2, HTN, Asthma, Lumbar DDD, and Seizures who presented to the [facility] ED with complaints of intermittent chest pain. She was found to have HCAP  Clinical Impression  Pt presents with mm weakness, impaired gait and mobility and is a fall risk. Pt will benefit from skilled PT to address deficits and improve functional mobility.    Follow Up Recommendations Home health PT;Supervision/Assistance - 24 hour    Equipment Recommendations       Recommendations for Other Services       Precautions / Restrictions Precautions Precautions: Fall Restrictions Weight Bearing Restrictions: No      Mobility  Bed Mobility Overal bed mobility: Modified Independent             General bed mobility comments: uses bed rails for supine to sit  Transfers Overall transfer level: Needs assistance Equipment used: Rolling walker (2 wheeled) Transfers: Sit to/from Stand Sit to Stand: Min assist         General transfer comment: steadying assist  Ambulation/Gait Ambulation/Gait assistance: Min assist Ambulation Distance (Feet): 5 Feet Assistive device: Rolling walker (2 wheeled)     Gait velocity interpretation: <1.8 ft/sec, indicative of risk for recurrent falls General Gait Details: pt with forward flexed posture, difficulty standing upright, pt with shuffling feet, states she just feels "weak"  Stairs            Wheelchair Mobility    Modified Rankin (Stroke Patients Only)       Balance                                             Pertinent Vitals/Pain Pain Assessment: 0-10 Pain Score: 5  Pain Location: B LEs Pain Descriptors / Indicators: Aching Pain Intervention(s): Patient requesting pain meds-RN notified;Monitored during session    Home  Living Family/patient expects to be discharged to:: Private residence Living Arrangements: Children Available Help at Discharge: Available 24 hours/day;Family Type of Home: House Home Access: Level entry     Home Layout: One level Home Equipment: Environmental consultant - 2 wheels Additional Comments: Pt states her MD is working on getting a Patent examiner for her    Prior Function Level of Independence: Independent with assistive device(s)               Hand Dominance        Extremity/Trunk Assessment   Upper Extremity Assessment: Generalized weakness           Lower Extremity Assessment: Generalized weakness      Cervical / Trunk Assessment: Kyphotic  Communication   Communication: No difficulties  Cognition Arousal/Alertness: Awake/alert Behavior During Therapy: WFL for tasks assessed/performed Overall Cognitive Status: Within Functional Limits for tasks assessed                      General Comments      Exercises        Assessment/Plan    PT Assessment Patient needs continued PT services  PT Diagnosis Difficulty walking;Generalized weakness   PT Problem List Decreased strength;Decreased activity tolerance;Decreased balance;Decreased mobility;Pain  PT Treatment Interventions DME instruction;Gait training;Functional mobility training;Therapeutic activities;Therapeutic exercise;Patient/family education;Balance training;Modalities   PT Goals (Current goals can  be found in the Care Plan section) Acute Rehab PT Goals Patient Stated Goal: get stronger and walk PT Goal Formulation: With patient Time For Goal Achievement: 11/24/15 Potential to Achieve Goals: Good    Frequency Min 3X/week   Barriers to discharge        Co-evaluation               End of Session Equipment Utilized During Treatment: Gait belt Activity Tolerance: Patient limited by fatigue Patient left: in chair;with chair alarm set;with call bell/phone within reach            Time: 0800-0813 PT Time Calculation (min) (ACUTE ONLY): 13 min   Charges:   PT Evaluation $Initial PT Evaluation Tier I: 1 Procedure     PT G Codes:        Deena Shaub 2015-11-23, 8:18 AM

## 2015-11-10 NOTE — Progress Notes (Signed)
Pt a/o, c/o generalized pain, PRN pain meds given as ordered, pt being dc home with services, dc instructions given to pt and pt verbalized understanding

## 2015-11-10 NOTE — Care Management Note (Signed)
Case Management Note  Patient Details  Name: Reya Aurich MRN: [id number] Date of Birth: 03/07/1956  Subjective/Objective:         Admitted with Pneumonia           Action/Plan: Patient lives at home with granddaughter (57 yrs old) and she assist her in her care. Patient could benefit from a Disease Management Program for Pneumonia; HHC choice offered, patient chose [facility], Lupita Leash with [facility] called for arrangements. Patient has a walker and a wheelchair at home. No other DME is needed at this time.  Expected Discharge Date:    11/10/2015              Expected Discharge Plan:  Home w Home Health Services  Discharge planning Services  CM Consult  Choice offered to:  Patient  HH Arranged:  RN, Disease Management HH Agency:  [facility]  Status of Service:  In process, will continue to follow  Reola Mosher [phone] 11/10/2015, 1:01 PM

## 2015-11-13 IMAGING — CT CT ABD-PELV W/ CM
2 of 5 series · 15 of 46 positions shown, 17 images · IV contrast (APPLIED)
Comparison: CT scan 07/31/2015

CLINICAL DATA: Abdominal pain, back pain, nausea and vomiting
today.

EXAM:
CT ABDOMEN AND PELVIS WITH CONTRAST
TECHNIQUE: Multidetector CT imaging of the abdomen and pelvis was performed
using the standard protocol following bolus administration of
intravenous contrast.
CONTRAST:  80 cc Omnipaque 300

[Series 2: abd/ pelvis 5.0 i30f 1 · axial · 0.77mm/px · z∈[-426,-42]mm · 12 of 87 slices shown, 14 images]
[im 5/87  soft-tissue]
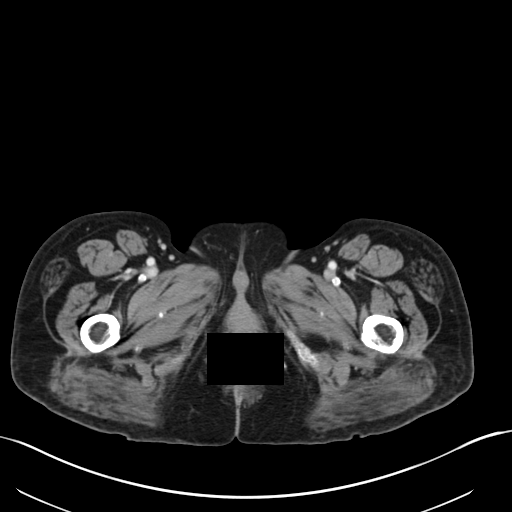
[im 5/87  bone]
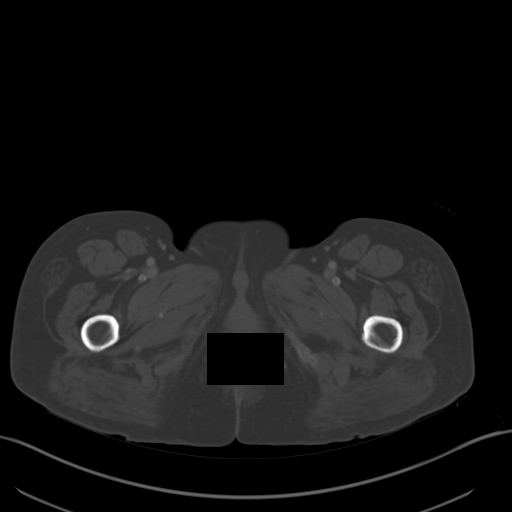
[im 13/87  soft-tissue]
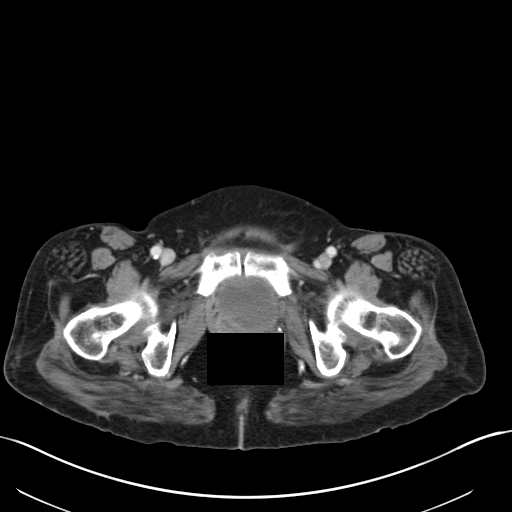
[im 18/87  soft-tissue]
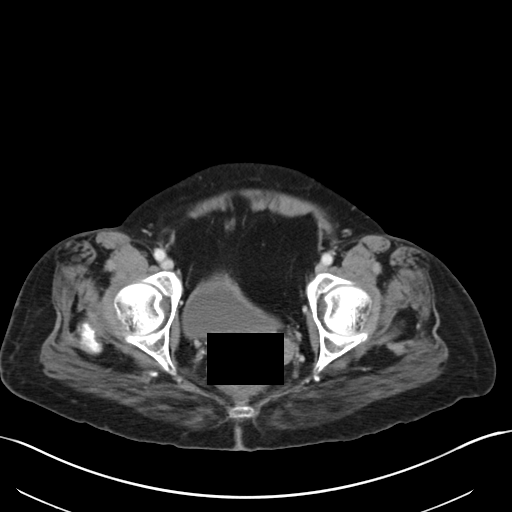
[im 26/87  soft-tissue]
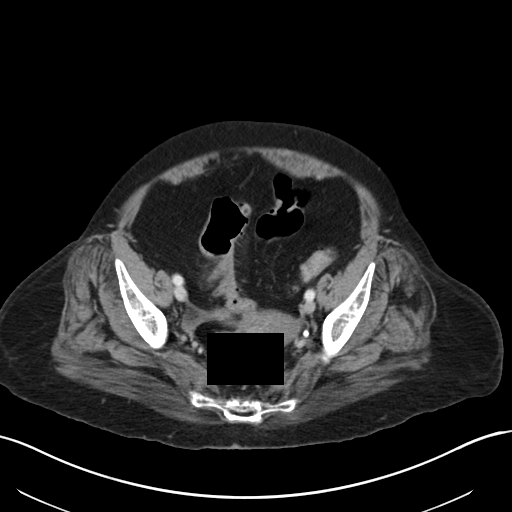
[im 35/87  soft-tissue]
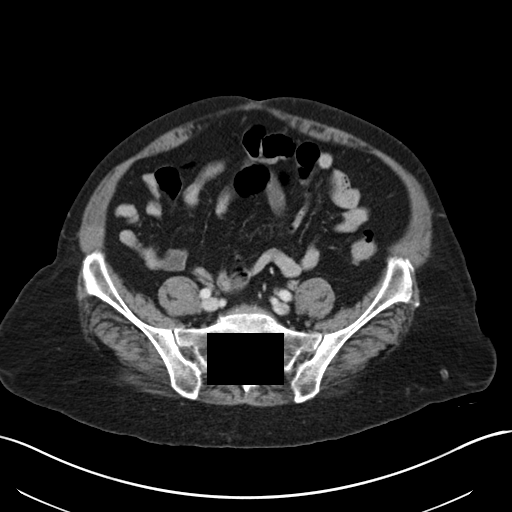
[im 39/87  soft-tissue]
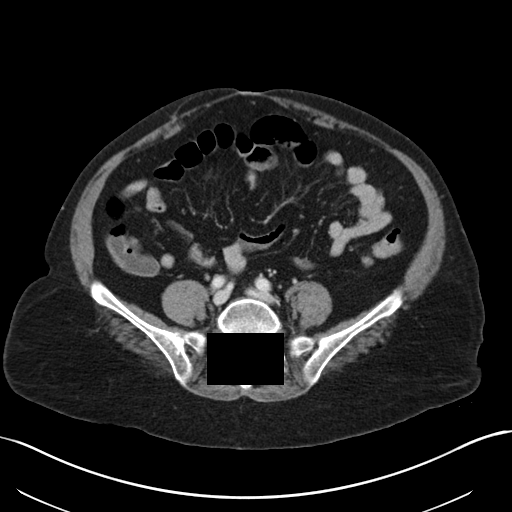
[im 48/87  soft-tissue]
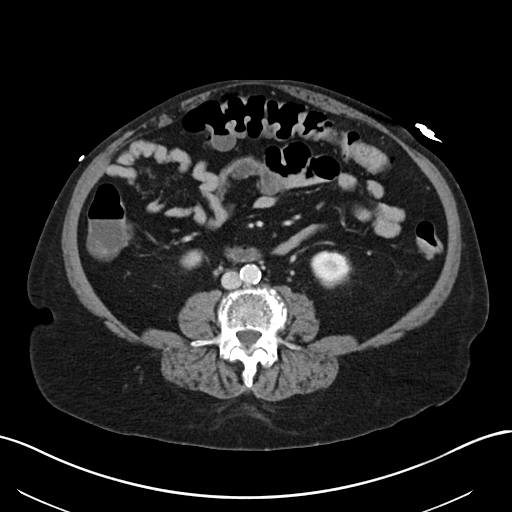
[im 52/87  soft-tissue]
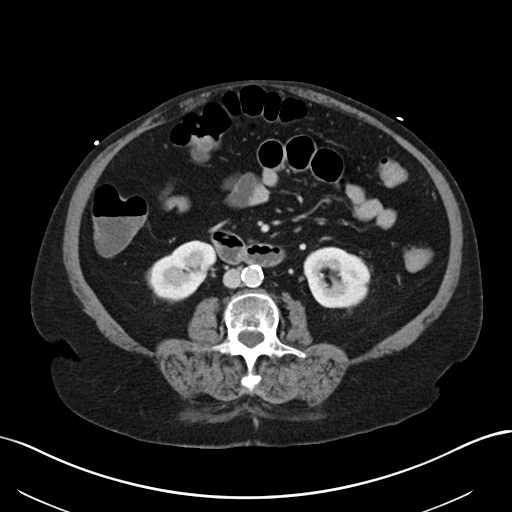
[im 61/87  soft-tissue]
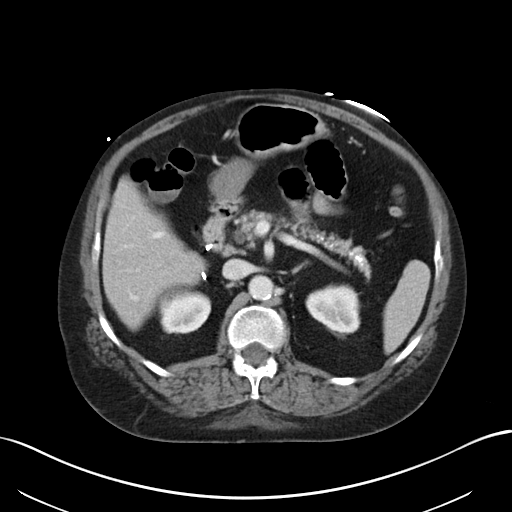
[im 61/87  bone]
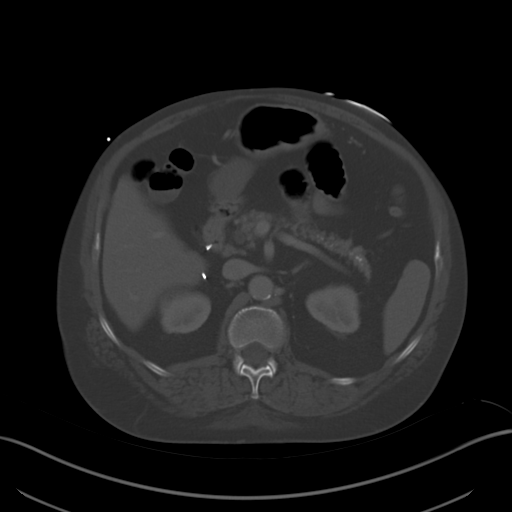
[im 69/87  soft-tissue]
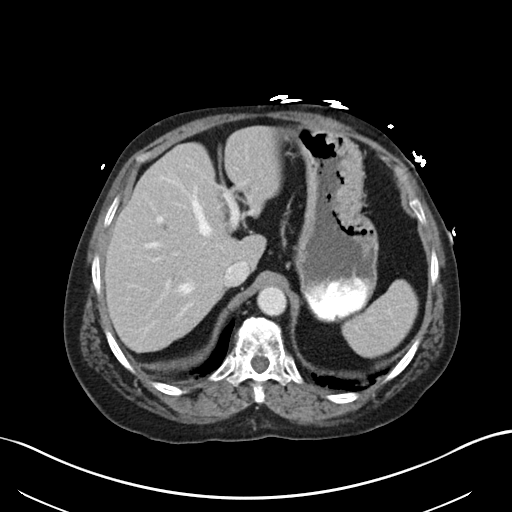
[im 74/87  soft-tissue]
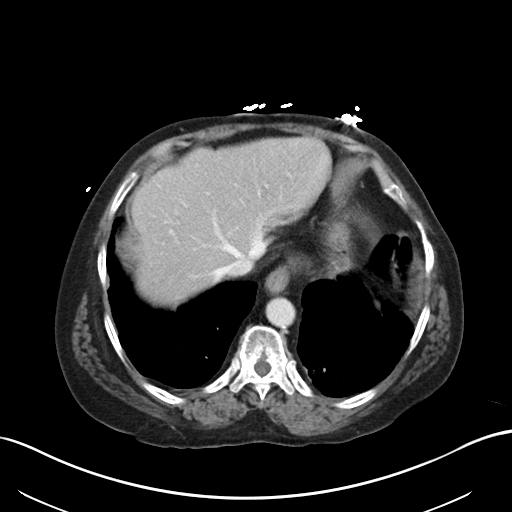
[im 82/87  soft-tissue]
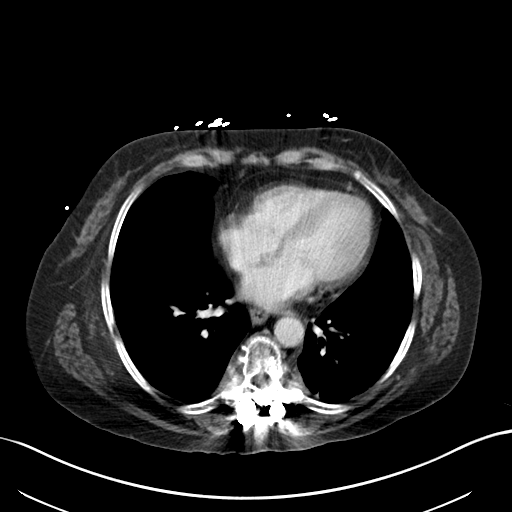

[Series 5: coronal soft tissue · coronal · 0.84mm/px · 3 of 89 slices shown]
[im 30/89  soft-tissue]
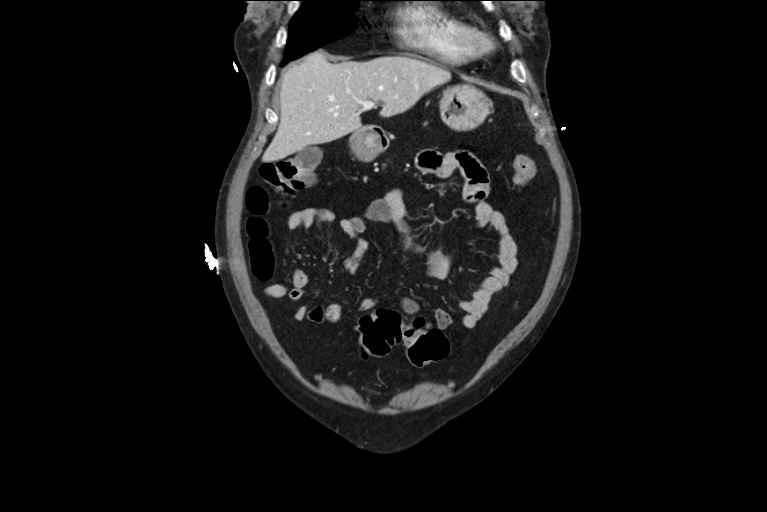
[im 40/89  soft-tissue]
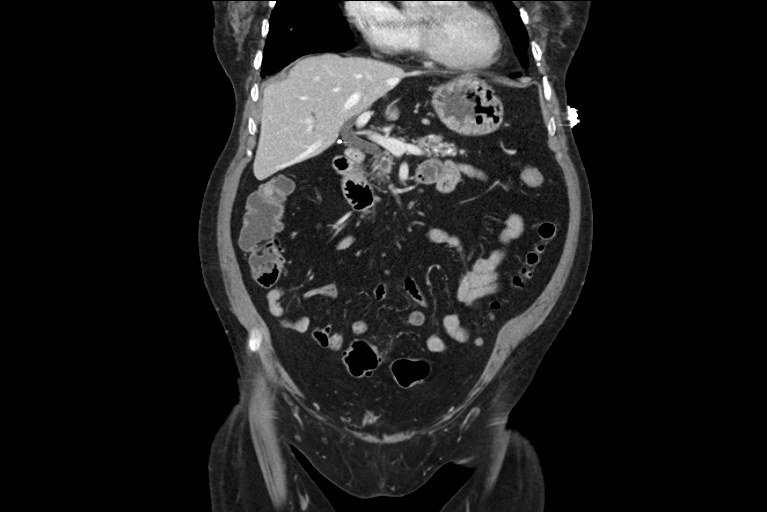
[im 49/89  soft-tissue]
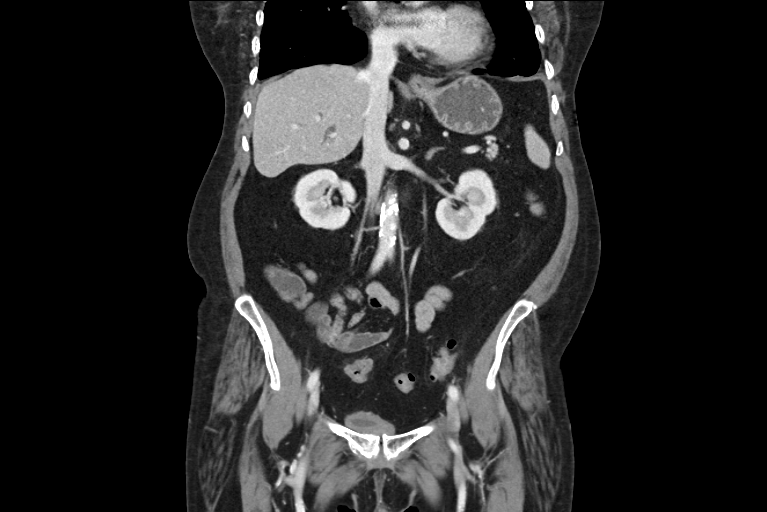

[15 of 46 positions shown; findings below may reference images not displayed]

FINDINGS: Lower chest: The lung bases are grossly clear despite motion
artifact and artifact from spinal hardware. Chronic right-sided
prominent pleural fat. No pulmonary infiltrates or pleural effusion.
The heart is mildly enlarged but stable. The distal esophagus is
grossly normal.

Hepatobiliary: No focal hepatic lesions. There is mild stable intra
and extrahepatic biliary dilatation likely due to prior
cholecystectomy.

Pancreas: Moderate stable pancreatic atrophy and chronic
calcifications likely due to chronic calcific pancreatitis. No mass
or acute inflammation.

Spleen: Normal size.  No focal lesions.

Adrenals/Urinary Tract: The adrenal glands are normal.

No renal, ureteral or bladder abnormality.

Stomach/Bowel: The stomach, duodenum, small bowel and colon are
grossly normal without oral contrast. Moderate scattered colonic
diverticulosis without findings for acute diverticulitis. The
terminal ileum is normal. The appendix is not visualized. No
findings for acute appendicitis.

Vascular/Lymphatic: No mesenteric or retroperitoneal mass or
adenopathy. Small scattered lymph nodes are noted. The aorta is
normal in caliber. Moderate stable atherosclerotic calcifications.
The branch vessels are patent. The major venous structures are
patent.

Reproductive: The uterus and ovaries are normal and stable.

Other: No pelvic mass or adenopathy. No free pelvic fluid
collections. No inguinal mass or adenopathy. A left inguinal hernia
is noted containing fat.

Musculoskeletal: No significant bony findings. Stable thoracic spine
hardware. Remote T12 compression fracture.
IMPRESSION: 1. No acute abdominal/pelvic findings, mass lesions or
lymphadenopathy.
2. Status postcholecystectomy with stable mild intra and
extrahepatic biliary dilatation.
3. Chronic calcific pancreatitis.
4. Diverticulosis without findings for acute diverticulitis.

## 2015-11-19 NOTE — ED Notes (Signed)
Notified again about walker left behind [phone]). Contact made at this time with pt. "Will have daughter from [place] pick up". Dan Humphreys is labeled in EMS room/ across from 14.

## 2015-12-23 ENCOUNTER — Emergency Department (HOSPITAL_COMMUNITY): Payer: Medicaid Other

## 2015-12-23 ENCOUNTER — Inpatient Hospital Stay (HOSPITAL_COMMUNITY)
Admission: EM | Admit: 2015-12-23 | Discharge: 2015-12-25 | DRG: 439 | Disposition: A | Discharge status: Home / Self Care | Payer: Medicaid Other | Attending: Internal Medicine | Admitting: Internal Medicine

## 2015-12-23 ENCOUNTER — Encounter (HOSPITAL_COMMUNITY): Payer: Self-pay | Admitting: *Deleted

## 2015-12-23 DIAGNOSIS — Z87898 Personal history of other specified conditions: Secondary | ICD-10-CM

## 2015-12-23 DIAGNOSIS — J45909 Unspecified asthma, uncomplicated: Secondary | ICD-10-CM | POA: Diagnosis present

## 2015-12-23 DIAGNOSIS — N179 Acute kidney failure, unspecified: Secondary | ICD-10-CM | POA: Diagnosis present

## 2015-12-23 DIAGNOSIS — I10 Essential (primary) hypertension: Secondary | ICD-10-CM | POA: Diagnosis present

## 2015-12-23 DIAGNOSIS — I82409 Acute embolism and thrombosis of unspecified deep veins of unspecified lower extremity: Secondary | ICD-10-CM

## 2015-12-23 DIAGNOSIS — E119 Type 2 diabetes mellitus without complications: Secondary | ICD-10-CM

## 2015-12-23 DIAGNOSIS — K861 Other chronic pancreatitis: Principal | ICD-10-CM | POA: Diagnosis present

## 2015-12-23 DIAGNOSIS — S92402A Displaced unspecified fracture of left great toe, initial encounter for closed fracture: Secondary | ICD-10-CM | POA: Diagnosis present

## 2015-12-23 DIAGNOSIS — M79604 Pain in right leg: Secondary | ICD-10-CM

## 2015-12-23 DIAGNOSIS — R748 Abnormal levels of other serum enzymes: Secondary | ICD-10-CM | POA: Diagnosis present

## 2015-12-23 DIAGNOSIS — R0789 Other chest pain: Secondary | ICD-10-CM | POA: Diagnosis present

## 2015-12-23 DIAGNOSIS — E860 Dehydration: Secondary | ICD-10-CM | POA: Diagnosis present

## 2015-12-23 DIAGNOSIS — R109 Unspecified abdominal pain: Secondary | ICD-10-CM | POA: Diagnosis present

## 2015-12-23 DIAGNOSIS — R1110 Vomiting, unspecified: Secondary | ICD-10-CM

## 2015-12-23 DIAGNOSIS — R079 Chest pain, unspecified: Secondary | ICD-10-CM | POA: Diagnosis present

## 2015-12-23 DIAGNOSIS — Z7982 Long term (current) use of aspirin: Secondary | ICD-10-CM

## 2015-12-23 DIAGNOSIS — K219 Gastro-esophageal reflux disease without esophagitis: Secondary | ICD-10-CM | POA: Diagnosis present

## 2015-12-23 DIAGNOSIS — W208XXA Other cause of strike by thrown, projected or falling object, initial encounter: Secondary | ICD-10-CM | POA: Diagnosis present

## 2015-12-23 DIAGNOSIS — E876 Hypokalemia: Secondary | ICD-10-CM | POA: Diagnosis present

## 2015-12-23 DIAGNOSIS — R140 Abdominal distension (gaseous): Secondary | ICD-10-CM | POA: Diagnosis present

## 2015-12-23 DIAGNOSIS — M1990 Unspecified osteoarthritis, unspecified site: Secondary | ICD-10-CM | POA: Diagnosis present

## 2015-12-23 DIAGNOSIS — G8929 Other chronic pain: Secondary | ICD-10-CM | POA: Diagnosis present

## 2015-12-23 DIAGNOSIS — E785 Hyperlipidemia, unspecified: Secondary | ICD-10-CM | POA: Diagnosis present

## 2015-12-23 DIAGNOSIS — R7989 Other specified abnormal findings of blood chemistry: Secondary | ICD-10-CM | POA: Diagnosis present

## 2015-12-23 DIAGNOSIS — Z87891 Personal history of nicotine dependence: Secondary | ICD-10-CM

## 2015-12-23 DIAGNOSIS — R52 Pain, unspecified: Secondary | ICD-10-CM

## 2015-12-23 DIAGNOSIS — F329 Major depressive disorder, single episode, unspecified: Secondary | ICD-10-CM | POA: Diagnosis present

## 2015-12-23 DIAGNOSIS — K2980 Duodenitis without bleeding: Secondary | ICD-10-CM

## 2015-12-23 DIAGNOSIS — R569 Unspecified convulsions: Secondary | ICD-10-CM | POA: Diagnosis present

## 2015-12-23 DIAGNOSIS — R600 Localized edema: Secondary | ICD-10-CM | POA: Diagnosis present

## 2015-12-23 DIAGNOSIS — R778 Other specified abnormalities of plasma proteins: Secondary | ICD-10-CM | POA: Diagnosis present

## 2015-12-23 DIAGNOSIS — R112 Nausea with vomiting, unspecified: Secondary | ICD-10-CM | POA: Diagnosis present

## 2015-12-23 DIAGNOSIS — G894 Chronic pain syndrome: Secondary | ICD-10-CM | POA: Diagnosis present

## 2015-12-23 DIAGNOSIS — M79605 Pain in left leg: Secondary | ICD-10-CM

## 2015-12-23 DIAGNOSIS — K5900 Constipation, unspecified: Secondary | ICD-10-CM | POA: Diagnosis present

## 2015-12-23 LAB — COMPREHENSIVE METABOLIC PANEL
ALT: 19 U/L (ref 14–54)
ANION GAP: 13 (ref 5–15)
AST: 23 U/L (ref 15–41)
Albumin: 3 g/dL — ABNORMAL LOW (ref 3.5–5.0)
Alkaline Phosphatase: 92 U/L (ref 38–126)
BILIRUBIN TOTAL: 0.9 mg/dL (ref 0.3–1.2)
BUN: 14 mg/dL (ref 6–20)
CALCIUM: 8.6 mg/dL — AB (ref 8.9–10.3)
CO2: 22 mmol/L (ref 22–32)
Chloride: 103 mmol/L (ref 101–111)
Creatinine, Ser: 1.08 mg/dL — ABNORMAL HIGH (ref 0.44–1.00)
GFR calc Af Amer: >60 mL/min (ref >60)
GFR, EST NON AFRICAN AMERICAN: 56 mL/min — AB (ref >60)
Glucose, Bld: 146 mg/dL — ABNORMAL HIGH (ref 65–99)
POTASSIUM: 3 mmol/L — AB (ref 3.5–5.1)
Sodium: 138 mmol/L (ref 135–145)
TOTAL PROTEIN: 6.1 g/dL — AB (ref 6.5–8.1)

## 2015-12-23 LAB — URINALYSIS, ROUTINE W REFLEX MICROSCOPIC
Bilirubin Urine: NEGATIVE
Glucose, UA: NEGATIVE mg/dL
Hgb urine dipstick: NEGATIVE
KETONES UR: NEGATIVE mg/dL
NITRITE: NEGATIVE
PH: 7 (ref 5.0–8.0)
Protein, ur: 30 mg/dL — AB
Specific Gravity, Urine: 1.019 (ref 1.005–1.030)

## 2015-12-23 LAB — TROPONIN I: TROPONIN I: 0.05 ng/mL — AB (ref <0.031)

## 2015-12-23 LAB — CBC
HEMATOCRIT: 34.4 % — AB (ref 36.0–46.0)
HEMOGLOBIN: 11.1 g/dL — AB (ref 12.0–15.0)
MCH: 25.1 pg — ABNORMAL LOW (ref 26.0–34.0)
MCHC: 32.3 g/dL (ref 30.0–36.0)
MCV: 77.7 fL — ABNORMAL LOW (ref 78.0–100.0)
Platelets: 220 10*3/uL (ref 150–400)
RBC: 4.43 MIL/uL (ref 3.87–5.11)
RDW: 17.7 % — AB (ref 11.5–15.5)
WBC: 9.5 10*3/uL (ref 4.0–10.5)

## 2015-12-23 LAB — URINE MICROSCOPIC-ADD ON
RBC / HPF: NONE SEEN RBC/hpf (ref 0–5)
WBC, UA: NONE SEEN WBC/hpf (ref 0–5)

## 2015-12-23 LAB — LIPASE, BLOOD: Lipase: 21 U/L (ref 11–51)

## 2015-12-23 LAB — CBG MONITORING, ED: GLUCOSE-CAPILLARY: 133 mg/dL — AB (ref 65–99)

## 2015-12-23 IMAGING — CR DG CHEST 2V
2 series · 2 of 2 positions shown · non-contrast
Comparison: 08/24/2015, 07/29/2015, 07/26/2015, 06/11/2015

CLINICAL DATA: 59-year-old female with a history of chest pain and
vomiting.

EXAM:
CHEST - 2 VIEW

[w chest pa]
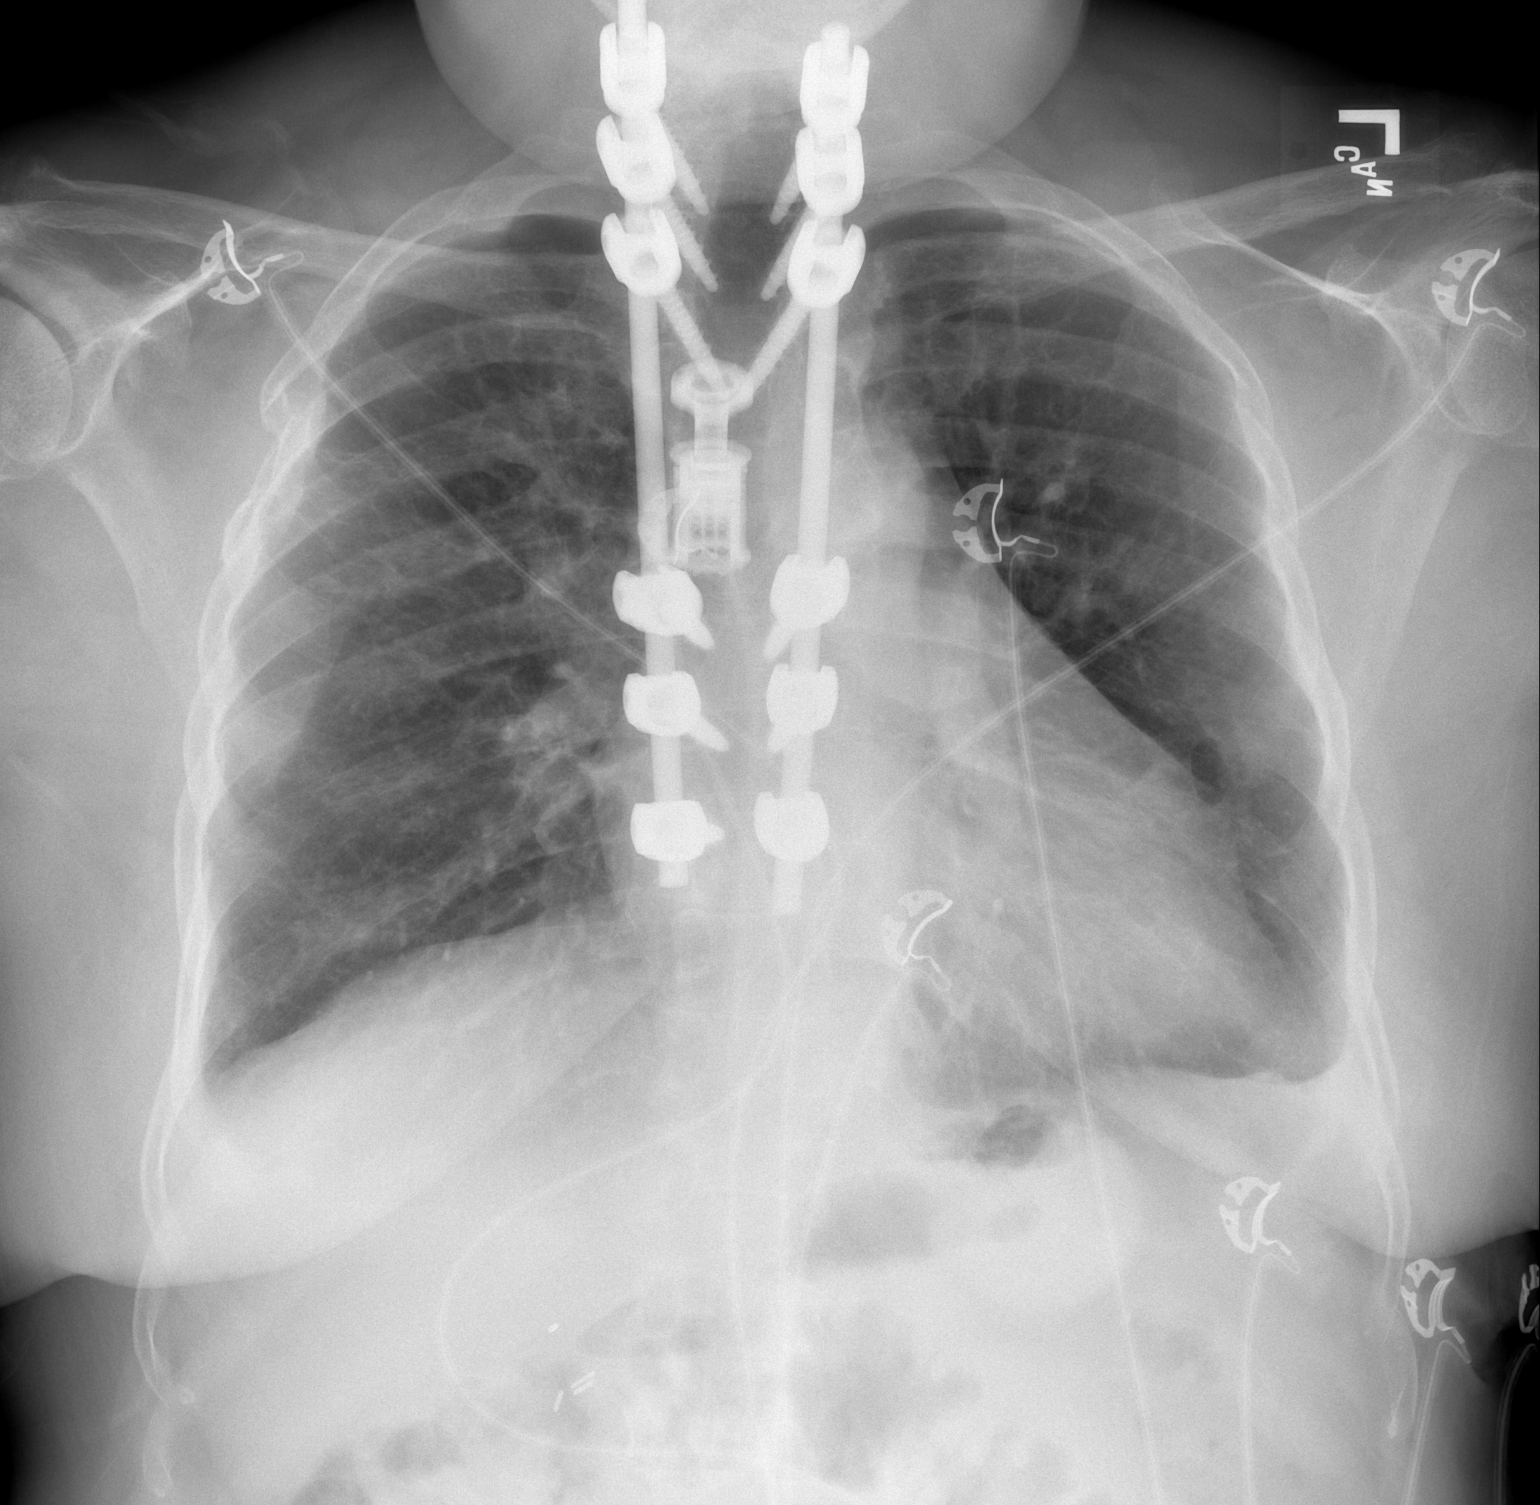

[w chest lat]
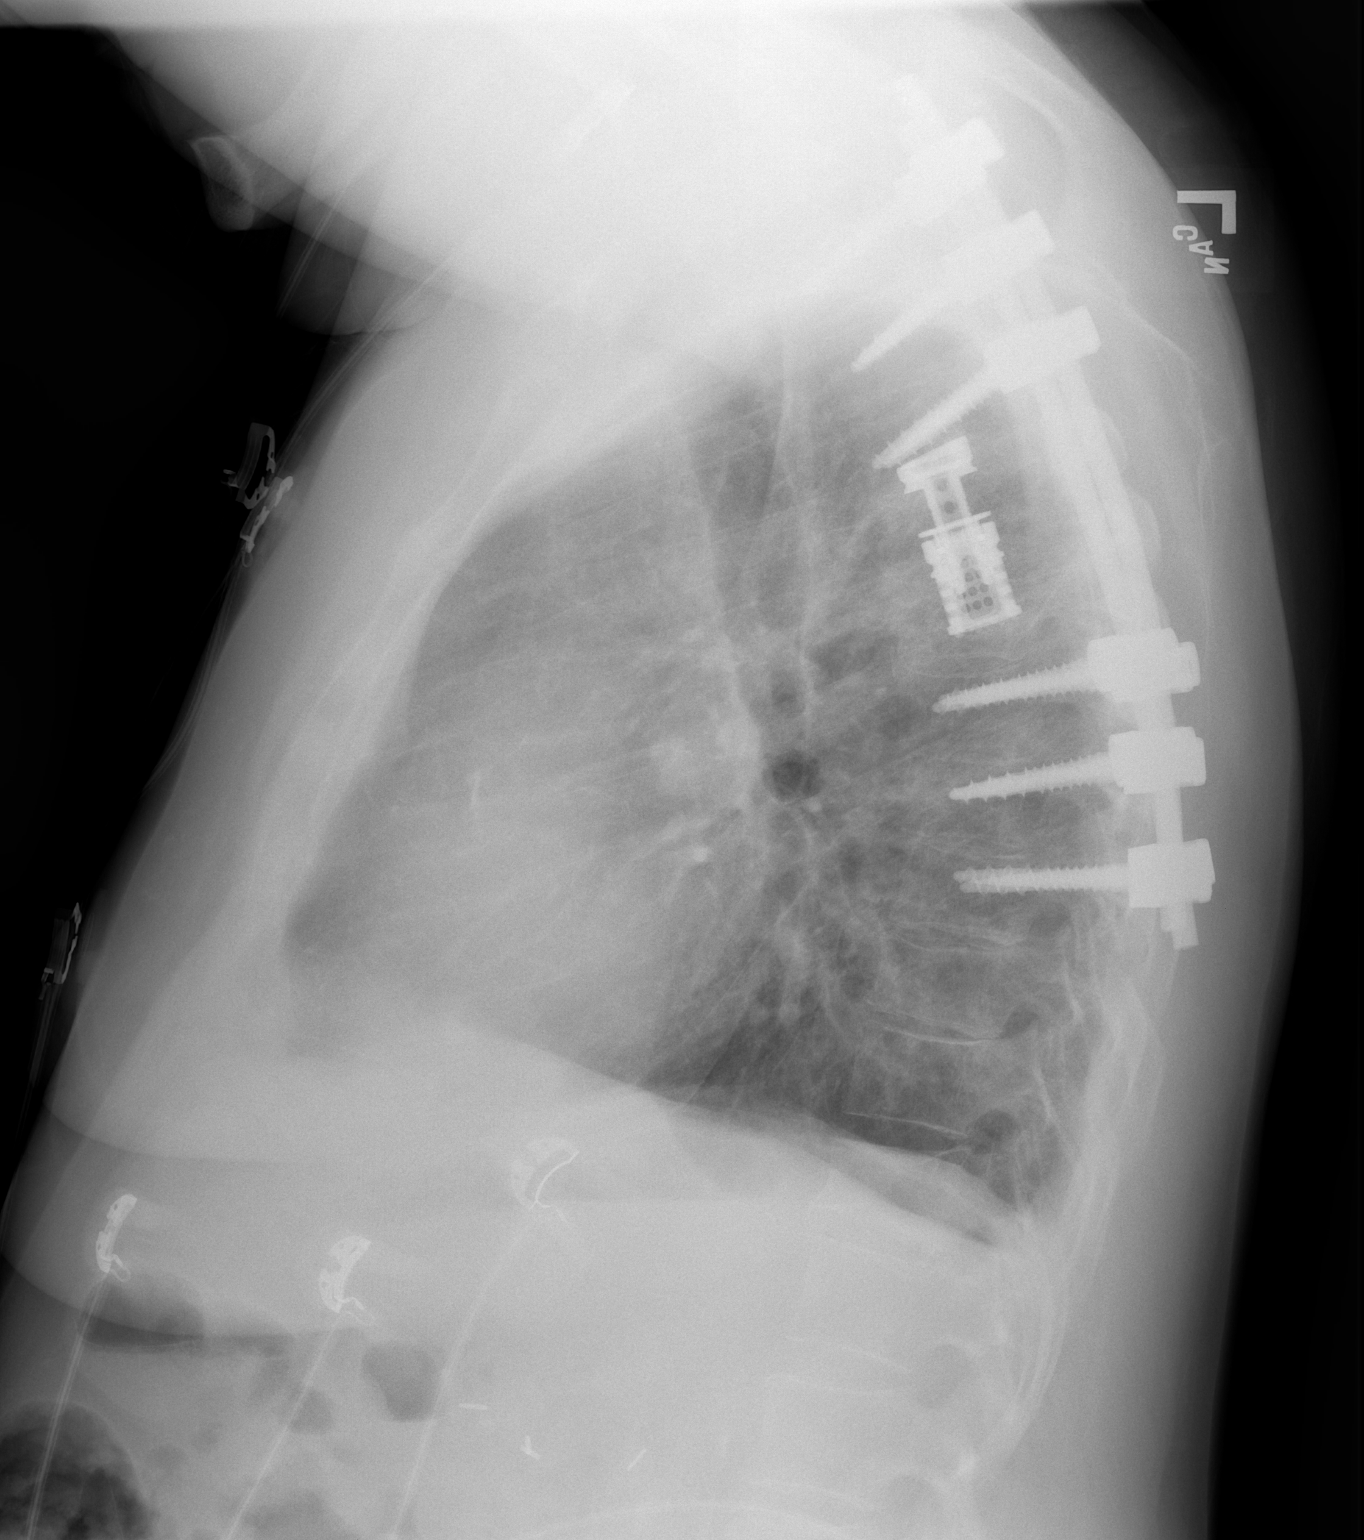

[2 of 2 positions shown; findings below may reference images not displayed]

FINDINGS: Cardiomediastinal silhouette unchanged in size and contour. No
evidence of pulmonary vascular congestion.

Bilateral interstitial opacities, more pronounced than on the
comparison plain film, without confluent airspace disease. No
pleural effusion. No pneumothorax.

Similar appearance of flattened hemidiaphragms.

Posttraumatic changes of the right-sided ribs again noted.

Surgical changes of thoracic fixation with corpectomy and interbody
spacer.
IMPRESSION: Coarsened interstitial opacities, more pronounced than on the
comparison study, potentially representing developing infection. No
evidence of lobar pneumonia or pleural effusion.

Similar appearance of surgical changes of thoracic fixation and
corpectomy.

## 2015-12-23 MED ORDER — BARIUM SULFATE 2.1 % PO SUSP
ORAL | Status: AC
  Filled 2015-12-23: qty 2

## 2015-12-23 MED ORDER — MORPHINE SULFATE (PF) 4 MG/ML IV SOLN
4.0000 mg | Freq: Once | INTRAVENOUS | Status: AC
  Administered 2015-12-23: 4 mg via INTRAVENOUS
  Filled 2015-12-23: qty 1

## 2015-12-23 MED ORDER — HYDROMORPHONE HCL 1 MG/ML IJ SOLN
1.0000 mg | Freq: Once | INTRAMUSCULAR | Status: AC
  Administered 2015-12-23: 1 mg via INTRAVENOUS
  Filled 2015-12-23: qty 1

## 2015-12-23 MED ORDER — ONDANSETRON HCL 4 MG/2ML IJ SOLN
4.0000 mg | Freq: Once | INTRAMUSCULAR | Status: AC
  Administered 2015-12-23: 4 mg via INTRAVENOUS
  Filled 2015-12-23: qty 2

## 2015-12-23 MED ORDER — SODIUM CHLORIDE 0.9 % IV BOLUS (SEPSIS)
1000.0000 mL | Freq: Once | INTRAVENOUS | Status: AC
  Administered 2015-12-23: 1000 mL via INTRAVENOUS

## 2015-12-23 NOTE — ED Provider Notes (Signed)
CSN: [id number]     Arrival date & time 12/23/15  2005 History   First MD Initiated Contact with Patient 12/23/15 2107     Chief Complaint  Patient presents with  . Chest Pain  . Abdominal Pain     Patient is a 60 y.o. female presenting with abdominal pain. The history is provided by the patient.  Abdominal Pain Pain location:  Generalized Pain quality: aching and bloating   Pain radiation: chest pain. Pain severity:  Moderate Onset quality:  Gradual Duration:  4 days Timing:  Constant Progression:  Worsening Chronicity:  New Relieved by:  Nothing Worsened by:  Movement and palpation Associated symptoms: chest pain, cough, nausea and shortness of breath   Associated symptoms: no constipation, no diarrhea, no fever and no vomiting   Risk factors: multiple surgeries   pt reports she began having abdominal distention/pain 4 days ago She reports nausea but no vomiting No diarrhea/constipation No fever She reports abd pain has worsened and radiates into chest She also reports injuring left foot after dropping frozen chicken on her foot    Past Medical History  Diagnosis Date  . Arthritis   . Asthma   . Hypertension   . Constipation   . GERD (gastroesophageal reflux disease)   . Diabetes mellitus without complication (HCC)   . Chronic pain   . Seizures (HCC)     last seizure March 2015  . Pneumonia 10/2015   Past Surgical History  Procedure Laterality Date  . Back surgery    . Cholecystectomy    . Appendectomy     Family History  Problem Relation Age of Onset  . Kidney failure Mother   . Hypertension Sister   . Hypertension Brother    Social History  Substance Use Topics  . Smoking status: Former Games developer  . Smokeless tobacco: Never Used  . Alcohol Use: No     Comment: admits to 2 drinks/week   OB History    No data available     Review of Systems  Constitutional: Negative for fever.  Respiratory: Positive for cough and shortness of breath.        Sob  from asthma   Cardiovascular: Positive for chest pain.  Gastrointestinal: Positive for nausea and abdominal pain. Negative for vomiting, diarrhea, constipation and blood in stool.  All other systems reviewed and are negative.     Allergies  Ivp dye  Home Medications   Prior to Admission medications   Medication Sig Start Date End Date Taking? Authorizing Provider  albuterol (PROVENTIL HFA;VENTOLIN HFA) 108 (90 BASE) MCG/ACT inhaler Inhale 2 puffs into the lungs every 6 (six) hours as needed for wheezing or shortness of breath.   Yes Historical Provider, MD  amLODipine (NORVASC) 5 MG tablet Take 5 mg by mouth daily.   Yes Historical Provider, MD  aspirin EC 81 MG tablet Take 1 tablet (81 mg total) by mouth daily. 06/14/15  Yes Richarda Overlie, MD  atorvastatin (LIPITOR) 10 MG tablet Take 10 mg by mouth daily. 08/13/15  Yes Historical Provider, MD  diazepam (VALIUM) 5 MG tablet Take 1 tablet (5 mg total) by mouth every 12 (twelve) hours as needed for muscle spasms. 08/25/15  Yes Rodolph Bong, MD  esomeprazole (NEXIUM) 40 MG capsule Take 40 mg by mouth daily at 12 noon.   Yes Historical Provider, MD  Fluticasone-Salmeterol (ADVAIR) 250-50 MCG/DOSE AEPB Inhale 1 puff into the lungs 2 (two) times daily.   Yes Historical Provider, MD  folic  acid (FOLVITE) 1 MG tablet Take 1 mg by mouth daily.   Yes Historical Provider, MD  furosemide (LASIX) 40 MG tablet Take 1 tablet (40 mg total) by mouth daily. 06/14/15  Yes Richarda Overlie, MD  gabapentin (NEURONTIN) 300 MG capsule Take 1 capsule (300 mg total) by mouth 2 (two) times daily. 06/14/15  Yes Richarda Overlie, MD  LINZESS 145 MCG CAPS capsule Take 145 mcg by mouth daily as needed.  08/13/15  Yes Historical Provider, MD  metFORMIN (GLUCOPHAGE-XR) 500 MG 24 hr tablet Take 500 mg by mouth daily with breakfast.   Yes Historical Provider, MD  metoprolol succinate (TOPROL-XL) 25 MG 24 hr tablet Take 25 mg by mouth daily.   Yes Historical Provider, MD  Oxycodone  HCl 20 MG TABS Take 20 mg by mouth every 4 (four) hours.   Yes Historical Provider, MD  oxymorphone (OPANA ER) 40 MG 12 hr tablet Take 40 mg by mouth every 12 (twelve) hours.   Yes Historical Provider, MD  polyethylene glycol (MIRALAX / GLYCOLAX) packet Take 17 g by mouth 2 (two) times daily. 06/14/15  Yes Richarda Overlie, MD  potassium chloride SA (K-DUR,KLOR-CON) 20 MEQ tablet Take 2 tablets (40 mEq total) by mouth daily. 01/27/15  Yes Calvert Cantor, MD  predniSONE (DELTASONE) 5 MG tablet Take 15 mg by mouth daily. 12/14/15  Yes Historical Provider, MD  promethazine (PHENERGAN) 25 MG tablet Take 1 tablet by mouth as needed. nausea 10/06/15  Yes Historical Provider, MD  tiZANidine (ZANAFLEX) 4 MG tablet Take 4 mg by mouth 4 (four) times daily. 08/13/15  Yes Historical Provider, MD  traMADol (ULTRAM) 50 MG tablet Take 50 mg by mouth as needed. For arthritis pain 12/01/15 11/30/16 Yes Historical Provider, MD  Vitamin D, Ergocalciferol, (DRISDOL) 50000 UNITS CAPS capsule Take 1 capsule (50,000 Units total) by mouth every 7 (seven) days. Sunday Patient taking differently: Take 50,000 Units by mouth every 7 (seven) days.  01/27/15  Yes Calvert Cantor, MD  VITAMIN E PO Take 1 capsule by mouth daily.   Yes Historical Provider, MD  amoxicillin-clavulanate (AUGMENTIN) 875-125 MG tablet Take 1 tablet by mouth every 12 (twelve) hours. 11/10/15   Catarina Hartshorn, MD  feeding supplement, ENSURE ENLIVE, (ENSURE ENLIVE) LIQD Take 237 mLs by mouth 2 (two) times daily between meals. 11/10/15   Catarina Hartshorn, MD   BP 132/86 mmHg  Pulse 83  Temp(Src) 97.9 F (36.6 C) (Oral)  Resp 18  Wt 64.439 kg  SpO2 100% Physical Exam CONSTITUTIONAL: Well developed/well nourished HEAD: Normocephalic/atraumatic EYES: EOMI/PERRL ENMT: Mucous membranes moist NECK: supple no meningeal signs SPINE/BACK:entire spine nontender CV: S1/S2 noted, no murmurs/rubs/gallops noted LUNGS: Lungs are clear to auscultation bilaterally, no apparent  distress ABDOMEN: soft, distended, moderate diffuse tenderness with decreased bowel sounds noted, no rebound or guarding GU:no cva tenderness NEURO: Pt is awake/alert/appropriate, moves all extremitiesx4.  No facial droop.   EXTREMITIES: pulses normal/equal, full ROM, mild tenderness to left great toe, no open wounds noted, mild erythema noted to left great toe SKIN: warm, color normal PSYCH: no abnormalities of mood noted, alert and oriented to situation  ED Course  Procedures  10:30 PM Pt appears to have chronically elevated troponins, no EKG changes and she admits that CP is actually starting in her abdomen and radiating into chest I have added on xray imaging to evaluate for obstruction For foot pain, xray pending 12:42 AM Pt with continued abd pain/vomting with negative xray imaging Will need Ct imaging She is vomiting and  unable to keep down PO contrast and has IV dye allergy CT imaging pending Signed out to shari upstill to f/u on CT imaging  Labs Review Labs Reviewed  COMPREHENSIVE METABOLIC PANEL - Abnormal; Notable for the following:    Potassium 3.0 (*)    Glucose, Bld 146 (*)    Creatinine, Ser 1.08 (*)    Calcium 8.6 (*)    Total Protein 6.1 (*)    Albumin 3.0 (*)    GFR calc non Af Amer 56 (*)    All other components within normal limits  CBC - Abnormal; Notable for the following:    Hemoglobin 11.1 (*)    HCT 34.4 (*)    MCV 77.7 (*)    MCH 25.1 (*)    RDW 17.7 (*)    All other components within normal limits  URINALYSIS, ROUTINE W REFLEX MICROSCOPIC (NOT AT [facility]) - Abnormal; Notable for the following:    APPearance TURBID (*)    Protein, ur 30 (*)    Leukocytes, UA TRACE (*)    All other components within normal limits  TROPONIN I - Abnormal; Notable for the following:    Troponin I 0.05 (*)    All other components within normal limits  URINE MICROSCOPIC-ADD ON - Abnormal; Notable for the following:    Squamous Epithelial / LPF TOO NUMEROUS TO COUNT (*)     Bacteria, UA MANY (*)    All other components within normal limits  CBG MONITORING, ED - Abnormal; Notable for the following:    Glucose-Capillary 133 (*)    All other components within normal limits  LIPASE, BLOOD    Imaging Review Dg Chest 2 View  12/23/2015  CLINICAL DATA:  Abdominal pain and distention for 4 days. Chest pain for 2 days. EXAM: CHEST  2 VIEW COMPARISON:  December 17 26 FINDINGS: The heart size and mediastinal contours are stable. There is no focal pneumonia or pulmonary edema. There are minimal bilateral pleural effusions. The visualized skeletal structures are stable. Patient status post prior posterior fusion of spine. Chronic posttraumatic changes right ribs are stable. IMPRESSION: Minimal bilateral pleural effusions. No focal pneumonia or pulmonary edema. Electronically Signed   By: Sherian Rein M.D.   On: 12/23/2015 20:54   Dg Abd 2 Views  12/23/2015  CLINICAL DATA:  Abdominal distention for 1 week EXAM: ABDOMEN - 2 VIEW COMPARISON:  11/09/2015 FINDINGS: The bowel gas pattern is normal. There is no evidence of free air. No radio-opaque calculi or other significant radiographic abnormality is seen. IMPRESSION: Negative. Electronically Signed   By: Signa Kell M.D.   On: 12/23/2015 22:23   Dg Toe Great Left  12/23/2015  CLINICAL DATA:  Dropped frozen chicken on left great toe 4 days ago with pain and swelling. Initial encounter. EXAM: LEFT GREAT TOE COMPARISON:  None. FINDINGS: Nondisplaced tuft fracture of the great toe.  No dislocation. Premature arterial calcification. Osteopenia. IMPRESSION: Nondisplaced tuft fracture of the great toe. Electronically Signed   By: Marnee Spring M.D.   On: 12/23/2015 22:23   I have personally reviewed and evaluated these images and lab results as part of my medical decision-making.   EKG Interpretation   Date/Time:  Thursday December 23 2015 20:09:53 EST Ventricular Rate:  83 PR Interval:  110 QRS Duration: 62 QT Interval:   352 QTC Calculation: 413 R Axis:   61 Text Interpretation:  Sinus rhythm with short PR Nonspecific T wave  abnormality Abnormal ECG artifact noted which limits evaluation  Confirmed  by Bebe Shaggy  MD, Delson Dulworth ([id number]) on 12/23/2015 8:48:02 PM     Medications  morphine 4 MG/ML injection 4 mg (4 mg Intravenous Given 12/23/15 2225)  ondansetron (ZOFRAN) injection 4 mg (4 mg Intravenous Given 12/23/15 2225)    MDM   Final diagnoses:  Pain  Pain    Nursing notes including past medical history and social history reviewed and considered in documentation xrays/imaging reviewed by myself and considered during evaluation Labs/vital reviewed myself and considered during evaluation Previous records reviewed and considered     Zadie Rhine, MD 12/24/15 [phone]

## 2015-12-23 NOTE — ED Notes (Signed)
Pt admits to abd pain and distention x4 days that has gotten progressively worse, pt states she then began having chest pain x2 days ago d/t her sever abd pain. Pt c/o mild shortness of breath however attributes that to her hx of asthma. Pt speaking complete sentences on arrival, skin warm and dry - pt also c/o bilat feet swelling and pain.

## 2015-12-23 NOTE — ED Notes (Signed)
Pt in xray, will take to room after.

## 2015-12-24 ENCOUNTER — Inpatient Hospital Stay (HOSPITAL_COMMUNITY): Payer: Medicaid Other

## 2015-12-24 ENCOUNTER — Emergency Department (HOSPITAL_COMMUNITY): Payer: Medicaid Other

## 2015-12-24 ENCOUNTER — Encounter (HOSPITAL_COMMUNITY): Payer: Self-pay | Admitting: *Deleted

## 2015-12-24 DIAGNOSIS — I10 Essential (primary) hypertension: Secondary | ICD-10-CM

## 2015-12-24 DIAGNOSIS — N179 Acute kidney failure, unspecified: Secondary | ICD-10-CM | POA: Diagnosis present

## 2015-12-24 DIAGNOSIS — R112 Nausea with vomiting, unspecified: Secondary | ICD-10-CM

## 2015-12-24 DIAGNOSIS — R079 Chest pain, unspecified: Secondary | ICD-10-CM

## 2015-12-24 DIAGNOSIS — E118 Type 2 diabetes mellitus with unspecified complications: Secondary | ICD-10-CM

## 2015-12-24 DIAGNOSIS — K861 Other chronic pancreatitis: Secondary | ICD-10-CM | POA: Diagnosis present

## 2015-12-24 DIAGNOSIS — R140 Abdominal distension (gaseous): Secondary | ICD-10-CM

## 2015-12-24 DIAGNOSIS — G894 Chronic pain syndrome: Secondary | ICD-10-CM | POA: Diagnosis present

## 2015-12-24 DIAGNOSIS — M1990 Unspecified osteoarthritis, unspecified site: Secondary | ICD-10-CM | POA: Diagnosis present

## 2015-12-24 DIAGNOSIS — E119 Type 2 diabetes mellitus without complications: Secondary | ICD-10-CM | POA: Diagnosis present

## 2015-12-24 DIAGNOSIS — R1084 Generalized abdominal pain: Secondary | ICD-10-CM | POA: Diagnosis not present

## 2015-12-24 DIAGNOSIS — E876 Hypokalemia: Secondary | ICD-10-CM | POA: Diagnosis present

## 2015-12-24 DIAGNOSIS — S92402A Displaced unspecified fracture of left great toe, initial encounter for closed fracture: Secondary | ICD-10-CM | POA: Diagnosis present

## 2015-12-24 DIAGNOSIS — M79605 Pain in left leg: Secondary | ICD-10-CM

## 2015-12-24 DIAGNOSIS — E785 Hyperlipidemia, unspecified: Secondary | ICD-10-CM | POA: Diagnosis present

## 2015-12-24 DIAGNOSIS — R748 Abnormal levels of other serum enzymes: Secondary | ICD-10-CM | POA: Diagnosis present

## 2015-12-24 DIAGNOSIS — K2980 Duodenitis without bleeding: Secondary | ICD-10-CM | POA: Diagnosis present

## 2015-12-24 DIAGNOSIS — Z7982 Long term (current) use of aspirin: Secondary | ICD-10-CM | POA: Diagnosis not present

## 2015-12-24 DIAGNOSIS — R778 Other specified abnormalities of plasma proteins: Secondary | ICD-10-CM | POA: Diagnosis present

## 2015-12-24 DIAGNOSIS — Z87891 Personal history of nicotine dependence: Secondary | ICD-10-CM | POA: Diagnosis not present

## 2015-12-24 DIAGNOSIS — R569 Unspecified convulsions: Secondary | ICD-10-CM | POA: Diagnosis present

## 2015-12-24 DIAGNOSIS — R600 Localized edema: Secondary | ICD-10-CM | POA: Diagnosis present

## 2015-12-24 DIAGNOSIS — F329 Major depressive disorder, single episode, unspecified: Secondary | ICD-10-CM | POA: Diagnosis present

## 2015-12-24 DIAGNOSIS — M79604 Pain in right leg: Secondary | ICD-10-CM

## 2015-12-24 DIAGNOSIS — R1013 Epigastric pain: Secondary | ICD-10-CM

## 2015-12-24 DIAGNOSIS — E860 Dehydration: Secondary | ICD-10-CM | POA: Diagnosis present

## 2015-12-24 DIAGNOSIS — R0789 Other chest pain: Secondary | ICD-10-CM | POA: Diagnosis present

## 2015-12-24 DIAGNOSIS — R7989 Other specified abnormal findings of blood chemistry: Secondary | ICD-10-CM | POA: Diagnosis present

## 2015-12-24 DIAGNOSIS — K219 Gastro-esophageal reflux disease without esophagitis: Secondary | ICD-10-CM | POA: Diagnosis present

## 2015-12-24 DIAGNOSIS — W208XXA Other cause of strike by thrown, projected or falling object, initial encounter: Secondary | ICD-10-CM | POA: Diagnosis present

## 2015-12-24 DIAGNOSIS — J45909 Unspecified asthma, uncomplicated: Secondary | ICD-10-CM | POA: Diagnosis present

## 2015-12-24 DIAGNOSIS — G8929 Other chronic pain: Secondary | ICD-10-CM | POA: Diagnosis present

## 2015-12-24 DIAGNOSIS — K5900 Constipation, unspecified: Secondary | ICD-10-CM | POA: Diagnosis present

## 2015-12-24 LAB — COMPREHENSIVE METABOLIC PANEL
ALK PHOS: 80 U/L (ref 38–126)
ALT: 17 U/L (ref 14–54)
ANION GAP: 15 (ref 5–15)
AST: 28 U/L (ref 15–41)
Albumin: 2.5 g/dL — ABNORMAL LOW (ref 3.5–5.0)
BILIRUBIN TOTAL: 0.9 mg/dL (ref 0.3–1.2)
BUN: 11 mg/dL (ref 6–20)
CALCIUM: 8.3 mg/dL — AB (ref 8.9–10.3)
CO2: 22 mmol/L (ref 22–32)
CREATININE: 0.84 mg/dL (ref 0.44–1.00)
Chloride: 111 mmol/L (ref 101–111)
Glucose, Bld: 96 mg/dL (ref 65–99)
Potassium: 4.2 mmol/L (ref 3.5–5.1)
Sodium: 148 mmol/L — ABNORMAL HIGH (ref 135–145)
TOTAL PROTEIN: 5.2 g/dL — AB (ref 6.5–8.1)

## 2015-12-24 LAB — TROPONIN I
TROPONIN I: 0.03 ng/mL (ref <0.031)
TROPONIN I: 0.04 ng/mL — AB (ref <0.031)
Troponin I: 0.03 ng/mL (ref <0.031)

## 2015-12-24 LAB — CBC
HCT: 34.2 % — ABNORMAL LOW (ref 36.0–46.0)
HEMOGLOBIN: 11.3 g/dL — AB (ref 12.0–15.0)
MCH: 25.5 pg — AB (ref 26.0–34.0)
MCHC: 33 g/dL (ref 30.0–36.0)
MCV: 77.2 fL — ABNORMAL LOW (ref 78.0–100.0)
Platelets: 157 10*3/uL (ref 150–400)
RBC: 4.43 MIL/uL (ref 3.87–5.11)
RDW: 18.1 % — AB (ref 11.5–15.5)
WBC: 8.9 10*3/uL (ref 4.0–10.5)

## 2015-12-24 LAB — GLUCOSE, CAPILLARY
GLUCOSE-CAPILLARY: 143 mg/dL — AB (ref 65–99)
Glucose-Capillary: 88 mg/dL (ref 65–99)
Glucose-Capillary: 99 mg/dL (ref 65–99)
Glucose-Capillary: 198 mg/dL — ABNORMAL HIGH (ref 65–99)

## 2015-12-24 LAB — PROCALCITONIN: PROCALCITONIN: 0.28 ng/mL

## 2015-12-24 LAB — MAGNESIUM: Magnesium: 1.3 mg/dL — ABNORMAL LOW (ref 1.7–2.4)

## 2015-12-24 LAB — BRAIN NATRIURETIC PEPTIDE: B NATRIURETIC PEPTIDE 5: 427.3 pg/mL — AB (ref 0.0–100.0)

## 2015-12-24 LAB — LACTIC ACID, PLASMA
LACTIC ACID, VENOUS: 1.2 mmol/L (ref 0.5–2.0)
LACTIC ACID, VENOUS: 0.9 mmol/L (ref 0.5–2.0)

## 2015-12-24 LAB — CREATININE, URINE, RANDOM: Creatinine, Urine: 42.86 mg/dL

## 2015-12-24 LAB — APTT: aPTT: 28 seconds (ref 24–37)

## 2015-12-24 LAB — D-DIMER, QUANTITATIVE (NOT AT ARMC): D DIMER QUANT: 2.29 ug{FEU}/mL — AB (ref 0.00–0.50)

## 2015-12-24 LAB — PROTIME-INR
INR: 1.09 (ref 0.00–1.49)
PROTHROMBIN TIME: 14.3 s (ref 11.6–15.2)

## 2015-12-24 IMAGING — CR DG ABD PORTABLE 1V
1 series · 1 of 1 positions shown · non-contrast
Comparison: 09/27/2015 CT

CLINICAL DATA: 59-year-old female with abdominal distention.

EXAM:
PORTABLE ABDOMEN - 1 VIEW

[AP]
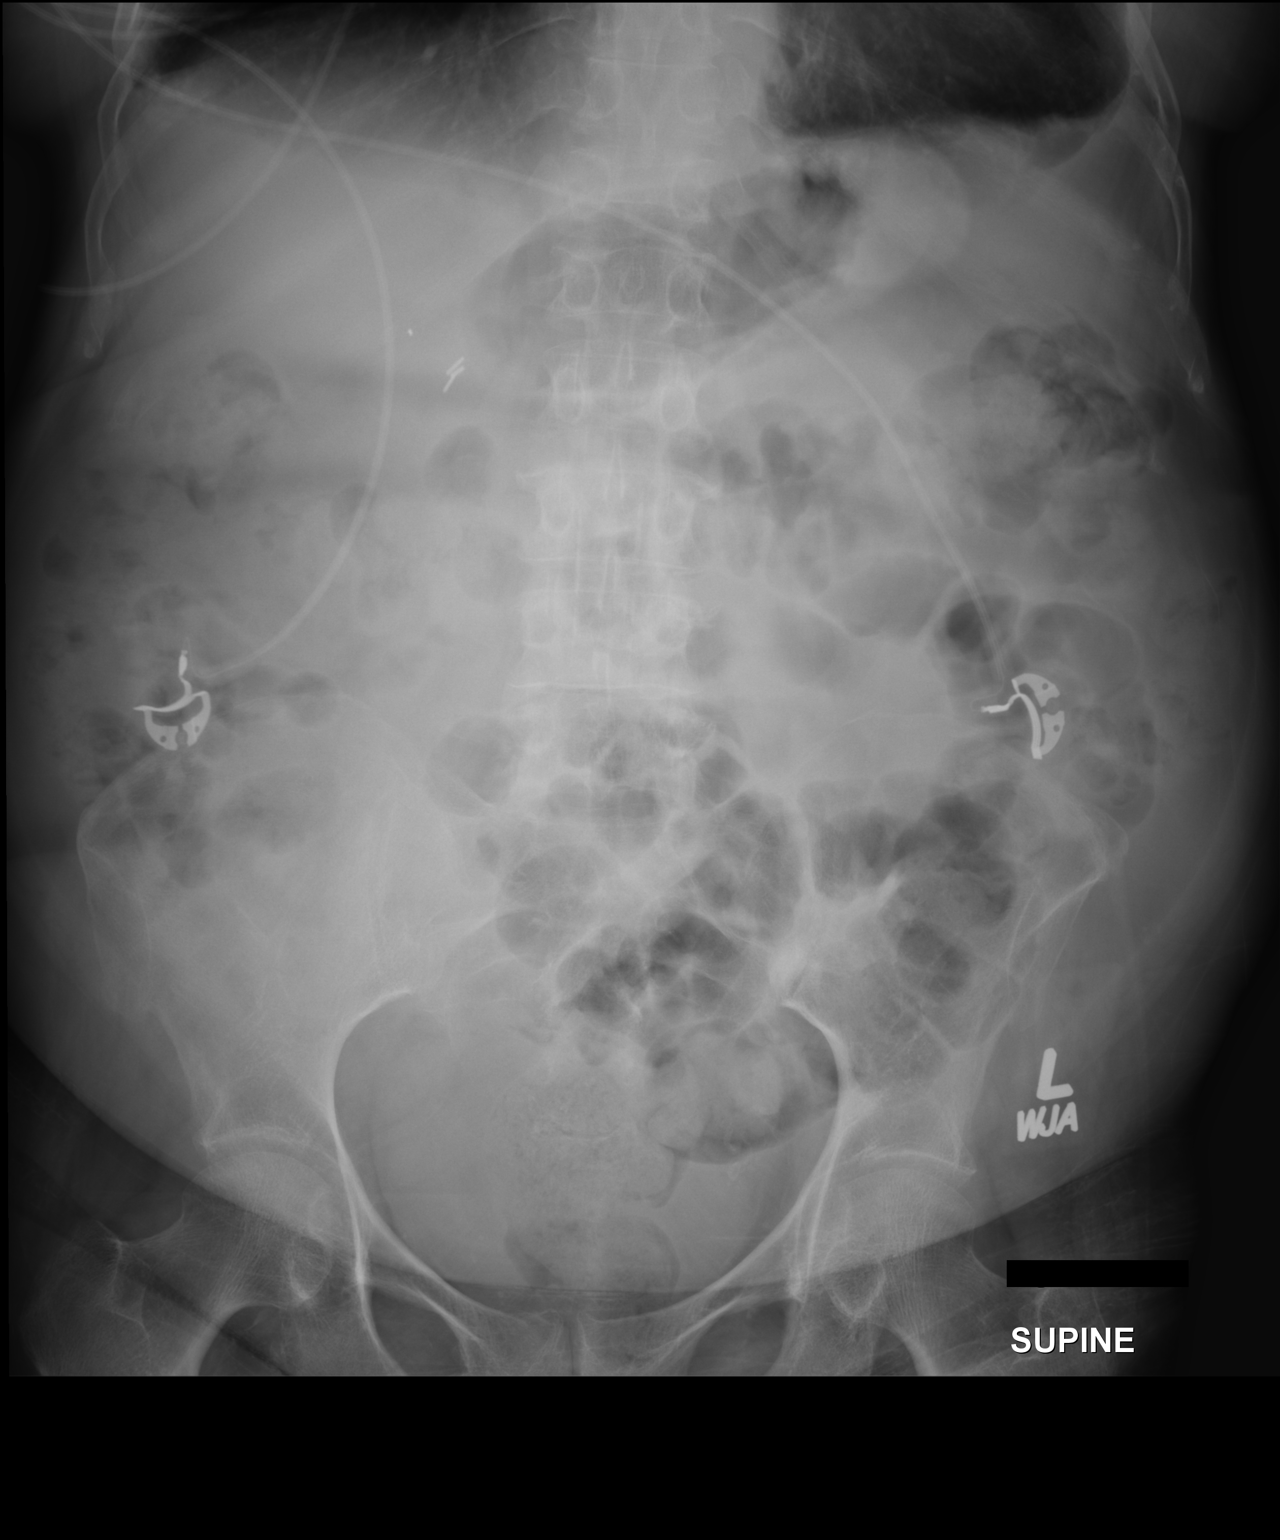

[1 of 1 positions shown; findings below may reference images not displayed]

FINDINGS: The bowel gas pattern is unremarkable.

There is no evidence of bowel obstruction.

Cholecystectomy clips are present.

No suspicious calcifications are identified.

No acute bony abnormalities are identified.
IMPRESSION: No acute abnormalities.

## 2015-12-24 MED ORDER — ALBUTEROL SULFATE (2.5 MG/3ML) 0.083% IN NEBU: 2.5000 mg | INHALATION_SOLUTION | RESPIRATORY_TRACT | Status: DC | PRN

## 2015-12-24 MED ORDER — ASPIRIN EC 81 MG PO TBEC
81.0000 mg | DELAYED_RELEASE_TABLET | Freq: Every day | ORAL | Status: DC
  Administered 2015-12-24 – 2015-12-25 (×2): 81 mg via ORAL
  Filled 2015-12-24 (×2): qty 1

## 2015-12-24 MED ORDER — AMLODIPINE BESYLATE 5 MG PO TABS
5.0000 mg | ORAL_TABLET | Freq: Every day | ORAL | Status: DC
  Administered 2015-12-24 – 2015-12-25 (×2): 5 mg via ORAL
  Filled 2015-12-24 (×2): qty 1

## 2015-12-24 MED ORDER — TIZANIDINE HCL 4 MG PO TABS
4.0000 mg | ORAL_TABLET | Freq: Four times a day (QID) | ORAL | Status: DC
  Administered 2015-12-24 – 2015-12-25 (×6): 4 mg via ORAL
  Filled 2015-12-24 (×6): qty 1

## 2015-12-24 MED ORDER — POLYETHYLENE GLYCOL 3350 17 G PO PACK
17.0000 g | PACK | Freq: Every day | ORAL | Status: DC
  Administered 2015-12-25: 17 g via ORAL
  Filled 2015-12-24: qty 1

## 2015-12-24 MED ORDER — PANTOPRAZOLE SODIUM 40 MG IV SOLR
40.0000 mg | Freq: Two times a day (BID) | INTRAVENOUS | Status: DC
  Administered 2015-12-24 – 2015-12-25 (×3): 40 mg via INTRAVENOUS
  Filled 2015-12-24 (×3): qty 40

## 2015-12-24 MED ORDER — GUAIFENESIN ER 600 MG PO TB12
600.0000 mg | ORAL_TABLET | Freq: Two times a day (BID) | ORAL | Status: DC
  Administered 2015-12-24 – 2015-12-25 (×3): 600 mg via ORAL
  Filled 2015-12-24 (×3): qty 1

## 2015-12-24 MED ORDER — SODIUM CHLORIDE 0.9 % IV SOLN: INTRAVENOUS | Status: DC

## 2015-12-24 MED ORDER — VITAMIN E 15 UNIT/0.3ML PO SOLN
100.0000 [IU] | Freq: Every day | ORAL | Status: DC
  Administered 2015-12-24 – 2015-12-25 (×2): 100 [IU] via ORAL
  Filled 2015-12-24 (×2): qty 2

## 2015-12-24 MED ORDER — ACETAMINOPHEN 650 MG RE SUPP: 650.0000 mg | Freq: Four times a day (QID) | RECTAL | Status: DC | PRN

## 2015-12-24 MED ORDER — MOMETASONE FURO-FORMOTEROL FUM 100-5 MCG/ACT IN AERO
2.0000 | INHALATION_SPRAY | Freq: Two times a day (BID) | RESPIRATORY_TRACT | Status: DC
  Administered 2015-12-24 – 2015-12-25 (×2): 2 via RESPIRATORY_TRACT
  Filled 2015-12-24 (×2): qty 8.8

## 2015-12-24 MED ORDER — HYDROMORPHONE HCL 1 MG/ML IJ SOLN: 1.0000 mg | INTRAMUSCULAR | Status: DC | PRN

## 2015-12-24 MED ORDER — TRAMADOL HCL 50 MG PO TABS
50.0000 mg | ORAL_TABLET | Freq: Three times a day (TID) | ORAL | Status: DC | PRN
Start: 2015-12-24 — End: 2015-12-25

## 2015-12-24 MED ORDER — MORPHINE SULFATE (PF) 4 MG/ML IV SOLN
4.0000 mg | Freq: Once | INTRAVENOUS | Status: AC
Start: 2015-12-24 — End: 2015-12-24
  Administered 2015-12-24: 4 mg via INTRAVENOUS
  Filled 2015-12-24: qty 1

## 2015-12-24 MED ORDER — HYDROMORPHONE HCL 1 MG/ML IJ SOLN
1.0000 mg | INTRAMUSCULAR | Status: DC | PRN
  Administered 2015-12-24: 1 mg via INTRAVENOUS
  Filled 2015-12-24: qty 1

## 2015-12-24 MED ORDER — METOPROLOL SUCCINATE ER 25 MG PO TB24
25.0000 mg | ORAL_TABLET | Freq: Every day | ORAL | Status: DC
  Administered 2015-12-25: 25 mg via ORAL
  Filled 2015-12-24 (×2): qty 1

## 2015-12-24 MED ORDER — FOLIC ACID 1 MG PO TABS
1.0000 mg | ORAL_TABLET | Freq: Every day | ORAL | Status: DC
  Administered 2015-12-24 – 2015-12-25 (×2): 1 mg via ORAL
  Filled 2015-12-24 (×2): qty 1

## 2015-12-24 MED ORDER — POTASSIUM CHLORIDE 10 MEQ/100ML IV SOLN
10.0000 meq | INTRAVENOUS | Status: AC
  Administered 2015-12-24 (×4): 10 meq via INTRAVENOUS
  Filled 2015-12-24 (×4): qty 100

## 2015-12-24 MED ORDER — POLYETHYLENE GLYCOL 3350 17 G PO PACK: 17.0000 g | PACK | Freq: Every day | ORAL | Status: DC | PRN

## 2015-12-24 MED ORDER — GABAPENTIN 300 MG PO CAPS
300.0000 mg | ORAL_CAPSULE | Freq: Two times a day (BID) | ORAL | Status: DC
  Administered 2015-12-24 – 2015-12-25 (×3): 300 mg via ORAL
  Filled 2015-12-24 (×3): qty 1

## 2015-12-24 MED ORDER — ATORVASTATIN CALCIUM 10 MG PO TABS
10.0000 mg | ORAL_TABLET | Freq: Every day | ORAL | Status: DC
  Administered 2015-12-24 – 2015-12-25 (×2): 10 mg via ORAL
  Filled 2015-12-24 (×2): qty 1

## 2015-12-24 MED ORDER — LINACLOTIDE 145 MCG PO CAPS
145.0000 ug | ORAL_CAPSULE | Freq: Every day | ORAL | Status: DC | PRN
  Filled 2015-12-24: qty 1

## 2015-12-24 MED ORDER — ONDANSETRON HCL 4 MG/2ML IJ SOLN
4.0000 mg | Freq: Three times a day (TID) | INTRAMUSCULAR | Status: AC | PRN
  Administered 2015-12-24: 4 mg via INTRAVENOUS
  Filled 2015-12-24: qty 2

## 2015-12-24 MED ORDER — TECHNETIUM TC 99M DIETHYLENETRIAME-PENTAACETIC ACID: 30.0000 | Freq: Once | INTRAVENOUS | Status: DC | PRN

## 2015-12-24 MED ORDER — VITAMIN D (ERGOCALCIFEROL) 1.25 MG (50000 UNIT) PO CAPS: 50000.0000 [IU] | ORAL_CAPSULE | ORAL | Status: DC

## 2015-12-24 MED ORDER — DIAZEPAM 5 MG PO TABS
5.0000 mg | ORAL_TABLET | Freq: Two times a day (BID) | ORAL | Status: DC | PRN
Start: 2015-12-24 — End: 2015-12-25
  Administered 2015-12-24 – 2015-12-25 (×2): 5 mg via ORAL
  Filled 2015-12-24 (×2): qty 1

## 2015-12-24 MED ORDER — SODIUM CHLORIDE 0.9% FLUSH
3.0000 mL | Freq: Two times a day (BID) | INTRAVENOUS | Status: DC
  Administered 2015-12-24 – 2015-12-25 (×3): 3 mL via INTRAVENOUS

## 2015-12-24 MED ORDER — TECHNETIUM TO 99M ALBUMIN AGGREGATED
4.0000 | Freq: Once | INTRAVENOUS | Status: AC | PRN
  Administered 2015-12-24: 4 via INTRAVENOUS

## 2015-12-24 MED ORDER — INSULIN ASPART 100 UNIT/ML ~~LOC~~ SOLN
0.0000 [IU] | Freq: Three times a day (TID) | SUBCUTANEOUS | Status: DC
  Administered 2015-12-24 – 2015-12-25 (×2): 1 [IU] via SUBCUTANEOUS
  Administered 2015-12-25: 2 [IU] via SUBCUTANEOUS

## 2015-12-24 MED ORDER — PREDNISONE 5 MG PO TABS
15.0000 mg | ORAL_TABLET | Freq: Every day | ORAL | Status: DC
  Administered 2015-12-24: 15 mg via ORAL
  Filled 2015-12-24: qty 1

## 2015-12-24 MED ORDER — PROMETHAZINE HCL 25 MG/ML IJ SOLN
12.5000 mg | Freq: Once | INTRAMUSCULAR | Status: AC
  Administered 2015-12-24: 12.5 mg via INTRAVENOUS
  Filled 2015-12-24: qty 1

## 2015-12-24 MED ORDER — ACETAMINOPHEN 325 MG PO TABS: 650.0000 mg | ORAL_TABLET | Freq: Four times a day (QID) | ORAL | Status: DC | PRN

## 2015-12-24 MED ORDER — MORPHINE SULFATE ER 30 MG PO TBCR
30.0000 mg | EXTENDED_RELEASE_TABLET | Freq: Two times a day (BID) | ORAL | Status: DC
  Administered 2015-12-24 – 2015-12-25 (×3): 30 mg via ORAL
  Filled 2015-12-24 (×3): qty 1

## 2015-12-24 MED ORDER — ENOXAPARIN SODIUM 40 MG/0.4ML ~~LOC~~ SOLN
40.0000 mg | SUBCUTANEOUS | Status: DC
  Administered 2015-12-24 – 2015-12-25 (×2): 40 mg via SUBCUTANEOUS
  Filled 2015-12-24 (×2): qty 0.4

## 2015-12-24 MED ORDER — DIPHENHYDRAMINE HCL 50 MG/ML IJ SOLN: 25.0000 mg | Freq: Once | INTRAMUSCULAR | Status: DC

## 2015-12-24 MED ORDER — TRAMADOL HCL 50 MG PO TABS: 50.0000 mg | ORAL_TABLET | ORAL | Status: DC | PRN

## 2015-12-24 MED ORDER — MAGNESIUM SULFATE 50 % IJ SOLN
4.0000 g | Freq: Once | INTRAVENOUS | Status: AC
  Administered 2015-12-24: 4 g via INTRAVENOUS
  Filled 2015-12-24: qty 8

## 2015-12-24 MED ORDER — OXYCODONE HCL 5 MG PO TABS
20.0000 mg | ORAL_TABLET | ORAL | Status: DC
Start: 2015-12-24 — End: 2015-12-25
  Administered 2015-12-24 – 2015-12-25 (×8): 20 mg via ORAL
  Filled 2015-12-24 (×8): qty 4

## 2015-12-24 MED ORDER — SODIUM CHLORIDE 0.9 % IV BOLUS (SEPSIS)
1000.0000 mL | Freq: Once | INTRAVENOUS | Status: AC
  Administered 2015-12-24: 1000 mL via INTRAVENOUS

## 2015-12-24 MED ORDER — MORPHINE SULFATE (PF) 2 MG/ML IV SOLN
2.0000 mg | INTRAVENOUS | Status: DC | PRN
  Administered 2015-12-24: 2 mg via INTRAVENOUS
  Filled 2015-12-24: qty 1

## 2015-12-24 MED ORDER — ONDANSETRON HCL 4 MG/2ML IJ SOLN
4.0000 mg | Freq: Once | INTRAMUSCULAR | Status: AC
  Administered 2015-12-24: 4 mg via INTRAVENOUS
  Filled 2015-12-24: qty 2

## 2015-12-24 NOTE — ED Provider Notes (Signed)
Patient in shared visit with Dr. Bebe Shaggy. Here for evaluation of abdominal pain, pending CT abd/pel. Pain has been difficult to control, vomiting has persisted. Anticipate likely admission for symptomatic control.   1:15 - patient unable to tolerate PO CM for CT - sent for study without contrast (cannot have IV CM). Continued to have active vomiting. Phenergan ordered. Will continue to observe.   2:15 - patient still having pain, so far uncontrolled, and nausea. Cannot tolerate PO fluids. CT scan showing duodenitis. The patient will be admitted for intractable pain and vomiting. VSS. Discussed plan of care with the patient who agrees to admission.  Elpidio Anis, PA-C 12/24/15 0310  Zadie Rhine, MD 12/25/15 [phone]

## 2015-12-24 NOTE — H&P (Signed)
[facility] History and Physical  [name] RJJ:[id number] DOB: 07/05/1956 DOA: 12/23/2015  Referring physician: ED physician PCP: [name], MD  Specialists:   Chief Complaint: Abdominal pain, nausea, vomiting, chest pain and shortness of breath  HPI: Amber [name] is a 60 y.o. female with PMH of chronically mild troponin elevation (0.03-0.09), hypertension, hyperlipidemia, diabetes mellitus, asthma, GERD, depression, seizure, arthritis, depression, who presents with nausea, vomiting, abdominal pain, chest pain and shortness of breath  Patient reports that she has been having abdominal pain, abdominal distention, nausea and vomiting for 4 days. She vomited 3 times without blood in the vomitus today. She does not have diarrhea. Her abdominal pain is located in epigastric area, constant, 8 out of 10 in severity, radiating to the left chest. Patient also reports that she has mild chest pain and shortness of breath. She attributes her shortness to asthma. She complains of bilateral lower leg pain, including the tenderness over calf areas. Patient denies of symptoms of UTI, unilateral weakness, rashes. She has mild pain over left great toe.  In ED, patient was found to have WBC 9.5, temperature normal, slightly tachycardia, troponin 0.05, BNP 427, lipase 21, potassium is 3.0, creatinine 1.08 and positive d-dimer. Chest x-ray is negative for infiltration, but showed minimal bilateral pleural effusion. CT abdomen/pelvis showed possible chronic pancreatitis and duodenitis. X ray of left food showed nondisplaced tuft fracture of the great toe. Patient is admitted to inpatient for further evaluation and treatment.  EKG: Independently reviewed. QTC 413, poor quality of EKG, T-wave flattening.  Where does patient live?   At home    Can patient participate in ADLs?  Yes  Review of Systems:   General: no fevers, chills, no changes in body weight, has poor appetite, has fatigue HEENT: no blurry  vision, hearing changes or sore throat Pulm: has dyspnea, coughing, no wheezing CV: has chest pain, no palpitations Abd: has nausea, vomiting, abdominal pain, no diarrhea, constipation GU: no dysuria, burning on urination, increased urinary frequency, hematuria  Ext: has leg edema Neuro: no unilateral weakness, numbness, or tingling, no vision change or hearing loss Skin: no rash MSK: No muscle spasm, no deformity, no limitation of range of movement in spin Heme: No easy bruising.  Travel history: No recent long distant travel.  Allergy:  Allergies  Allergen Reactions  . Ivp Dye [Iodinated Diagnostic Agents] Itching    Past Medical History  Diagnosis Date  . Arthritis   . Asthma   . Hypertension   . Constipation   . GERD (gastroesophageal reflux disease)   . Diabetes mellitus without complication (HCC)   . Chronic pain   . Seizures (HCC)     last seizure March 2015  . Pneumonia 10/2015    Past Surgical History  Procedure Laterality Date  . Back surgery    . Cholecystectomy    . Appendectomy      Social History:  reports that she has quit smoking. She has never used smokeless tobacco. She reports that she does not drink alcohol or use illicit drugs.  Family History:  Family History  Problem Relation Age of Onset  . Kidney failure Mother   . Hypertension Sister   . Hypertension Brother      Prior to Admission medications   Medication Sig Start Date End Date Taking? Authorizing Provider  albuterol (PROVENTIL HFA;VENTOLIN HFA) 108 (90 BASE) MCG/ACT inhaler Inhale 2 puffs into the lungs every 6 (six) hours as needed for wheezing or shortness of breath.   Yes Historical  Provider, MD  amLODipine (NORVASC) 5 MG tablet Take 5 mg by mouth daily.   Yes Historical Provider, MD  aspirin EC 81 MG tablet Take 1 tablet (81 mg total) by mouth daily. 06/14/15  Yes Richarda Overlie, MD  atorvastatin (LIPITOR) 10 MG tablet Take 10 mg by mouth daily. 08/13/15  Yes Historical Provider, MD   diazepam (VALIUM) 5 MG tablet Take 1 tablet (5 mg total) by mouth every 12 (twelve) hours as needed for muscle spasms. 08/25/15  Yes Rodolph Bong, MD  esomeprazole (NEXIUM) 40 MG capsule Take 40 mg by mouth daily at 12 noon.   Yes Historical Provider, MD  Fluticasone-Salmeterol (ADVAIR) 250-50 MCG/DOSE AEPB Inhale 1 puff into the lungs 2 (two) times daily.   Yes Historical Provider, MD  folic acid (FOLVITE) 1 MG tablet Take 1 mg by mouth daily.   Yes Historical Provider, MD  furosemide (LASIX) 40 MG tablet Take 1 tablet (40 mg total) by mouth daily. 06/14/15  Yes Richarda Overlie, MD  gabapentin (NEURONTIN) 300 MG capsule Take 1 capsule (300 mg total) by mouth 2 (two) times daily. 06/14/15  Yes Richarda Overlie, MD  LINZESS 145 MCG CAPS capsule Take 145 mcg by mouth daily as needed.  08/13/15  Yes Historical Provider, MD  metFORMIN (GLUCOPHAGE-XR) 500 MG 24 hr tablet Take 500 mg by mouth daily with breakfast.   Yes Historical Provider, MD  metoprolol succinate (TOPROL-XL) 25 MG 24 hr tablet Take 25 mg by mouth daily.   Yes Historical Provider, MD  Oxycodone HCl 20 MG TABS Take 20 mg by mouth every 4 (four) hours.   Yes Historical Provider, MD  oxymorphone (OPANA ER) 40 MG 12 hr tablet Take 40 mg by mouth every 12 (twelve) hours.   Yes Historical Provider, MD  polyethylene glycol (MIRALAX / GLYCOLAX) packet Take 17 g by mouth 2 (two) times daily. 06/14/15  Yes Richarda Overlie, MD  potassium chloride SA (K-DUR,KLOR-CON) 20 MEQ tablet Take 2 tablets (40 mEq total) by mouth daily. 01/27/15  Yes Calvert Cantor, MD  predniSONE (DELTASONE) 5 MG tablet Take 15 mg by mouth daily. 12/14/15  Yes Historical Provider, MD  promethazine (PHENERGAN) 25 MG tablet Take 1 tablet by mouth as needed. nausea 10/06/15  Yes Historical Provider, MD  tiZANidine (ZANAFLEX) 4 MG tablet Take 4 mg by mouth 4 (four) times daily. 08/13/15  Yes Historical Provider, MD  traMADol (ULTRAM) 50 MG tablet Take 50 mg by mouth as needed. For arthritis  pain 12/01/15 11/30/16 Yes Historical Provider, MD  Vitamin D, Ergocalciferol, (DRISDOL) 50000 UNITS CAPS capsule Take 1 capsule (50,000 Units total) by mouth every 7 (seven) days. Sunday Patient taking differently: Take 50,000 Units by mouth every 7 (seven) days.  01/27/15  Yes Calvert Cantor, MD  VITAMIN E PO Take 1 capsule by mouth daily.   Yes Historical Provider, MD  amoxicillin-clavulanate (AUGMENTIN) 875-125 MG tablet Take 1 tablet by mouth every 12 (twelve) hours. 11/10/15   Catarina Hartshorn, MD  feeding supplement, ENSURE ENLIVE, (ENSURE ENLIVE) LIQD Take 237 mLs by mouth 2 (two) times daily between meals. 11/10/15   Catarina Hartshorn, MD    Physical Exam: Filed Vitals:   12/24/15 0030 12/24/15 0045 12/24/15 0442 12/24/15 0447  BP: 131/91 115/81  132/79  Pulse: 104   95  Temp:    99.2 F (37.3 C)  TempSrc:    Oral  Resp: 23 14  16   Height:   5' (1.524 m)   Weight:   63.141 kg (  139 lb 3.2 oz)   SpO2: 100%   100%   General: Not in acute distress HEENT:       Eyes: PERRL, EOMI, no scleral icterus.       ENT: No discharge from the ears and nose, no pharynx injection, no tonsillar enlargement.        Neck: No JVD, no bruit, no mass felt. Heme: No neck lymph node enlargement. Cardiac: S1/S2, RRR, No murmurs, No gallops or rubs. Pulm:  No rales, wheezing, rhonchi or rubs. Abd: Soft, nondistended, tenderness over epigastric area, no rebound pain, no organomegaly, BS present. Ext: trace leg edema bilaterally. 2+DP/PT pulse bilaterally. Has tenderness over both legs circumferentially. Musculoskeletal: No joint deformities, No joint redness or warmth, no limitation of ROM in spin. Skin: No rashes.  Neuro: Alert, oriented X3, cranial nerves II-XII grossly intact, moves all extremities normally. Psych: Patient is not psychotic, no suicidal or hemocidal ideation.  Labs on Admission:  Basic Metabolic Panel:  Recent Labs Lab 12/23/15 2040 12/24/15 0449  NA 138 148*  K 3.0* 4.2  CL 103 111  CO2 22  22  GLUCOSE 146* 96  BUN 14 11  CREATININE 1.08* 0.84  CALCIUM 8.6* 8.3*  MG  --  1.3*   Liver Function Tests:  Recent Labs Lab 12/23/15 2040 12/24/15 0449  AST 23 28  ALT 19 17  ALKPHOS 92 80  BILITOT 0.9 0.9  PROT 6.1* 5.2*  ALBUMIN 3.0* 2.5*    Recent Labs Lab 12/23/15 2040  LIPASE 21   No results for input(s): AMMONIA in the last 168 hours. CBC:  Recent Labs Lab 12/23/15 2040 12/24/15 0449  WBC 9.5 8.9  HGB 11.1* 11.3*  HCT 34.4* 34.2*  MCV 77.7* 77.2*  PLT 220 157   Cardiac Enzymes:  Recent Labs Lab 12/23/15 2040 12/24/15 0448  TROPONINI 0.05* 0.03    BNP (last 3 results)  Recent Labs  07/26/15 1931 07/27/15 0150 12/24/15 0450  BNP 37.8 41.3 427.3*    ProBNP (last 3 results) No results for input(s): PROBNP in the last 8760 hours.  CBG:  Recent Labs Lab 12/23/15 2026 12/24/15 0646  GLUCAP 133* 88    Radiological Exams on Admission: Ct Abdomen Pelvis Wo Contrast  12/24/2015  CLINICAL DATA:  Use abdominal pain.  Bloating for 4 days. EXAM: CT ABDOMEN AND PELVIS WITHOUT CONTRAST TECHNIQUE: Multidetector CT imaging of the abdomen and pelvis was performed following the standard protocol without IV contrast. COMPARISON:  11/09/2015 FINDINGS: Lower chest and abdominal wall:  Fatty left groin hernia. Hepatobiliary: Mild lobulated liver surface without definitive cirrhosis. Cholecystectomy with normal common bile duct diameter. Pancreas: Atrophic with calcifications. No duct enlargement. No primary inflammation suspected. Spleen: Unremarkable. Adrenals/Urinary Tract: Negative adrenals. No hydronephrosis or stone. Full bladder without acute superimposed finding. Reproductive:No pathologic findings. Stomach/Bowel: There is thickening and fat edema around the distal duodenum consistent with duodenitis. No discrete ulceration is seen; there are small diverticula but no focal diverticular inflammation. Rapid development since prior is supportive of a  primary inflammatory process. Moderate colonic diverticulosis without inflammation. No pericecal inflammation. Patient has history of appendectomy. Vascular/Lymphatic: No acute vascular abnormality. No mass or adenopathy. Peritoneal: No ascites or pneumoperitoneum. Musculoskeletal: No acute abnormalities. Remote T12 inferior endplate fracture. Disc and facet degeneration focally advanced at L4-5 where there is mild anterolisthesis. IMPRESSION: 1. Distal duodenitis. 2. Chronic pancreatitis. 3. Incidental findings are stable and described above. Electronically Signed   By: Marnee Spring M.D.   On: 12/24/2015 02:15  Dg Chest 2 View  12/23/2015  CLINICAL DATA:  Abdominal pain and distention for 4 days. Chest pain for 2 days. EXAM: CHEST  2 VIEW COMPARISON:  December 17 26 FINDINGS: The heart size and mediastinal contours are stable. There is no focal pneumonia or pulmonary edema. There are minimal bilateral pleural effusions. The visualized skeletal structures are stable. Patient status post prior posterior fusion of spine. Chronic posttraumatic changes right ribs are stable. IMPRESSION: Minimal bilateral pleural effusions. No focal pneumonia or pulmonary edema. Electronically Signed   By: Sherian Rein M.D.   On: 12/23/2015 20:54   Dg Abd 2 Views  12/23/2015  CLINICAL DATA:  Abdominal distention for 1 week EXAM: ABDOMEN - 2 VIEW COMPARISON:  11/09/2015 FINDINGS: The bowel gas pattern is normal. There is no evidence of free air. No radio-opaque calculi or other significant radiographic abnormality is seen. IMPRESSION: Negative. Electronically Signed   By: Signa Kell M.D.   On: 12/23/2015 22:23   Dg Toe Great Left  12/23/2015  CLINICAL DATA:  Dropped frozen chicken on left great toe 4 days ago with pain and swelling. Initial encounter. EXAM: LEFT GREAT TOE COMPARISON:  None. FINDINGS: Nondisplaced tuft fracture of the great toe.  No dislocation. Premature arterial calcification. Osteopenia. IMPRESSION:  Nondisplaced tuft fracture of the great toe. Electronically Signed   By: Marnee Spring M.D.   On: 12/23/2015 22:23    Assessment/Plan Principal Problem:   Abdominal pain Active Problems:   Hypokalemia   Hypertension   History of seizures   DM type 2 causing complication (HCC)   Diabetes mellitus type 2, controlled (HCC)   Chest pain   Nausea with vomiting   Abdominal distension   Elevated troponin   AKI (acute kidney injury) (HCC)   Abdominal pain: This is likely due to duodenitis and chronic pancreatitis and  as evidenced by the CT abdomen/pelvis. Lipase normal. Patient is not septic on admission.  Hemodynamically stable. -will admit to tele bed due to tachycardia -Pain control; when necessary Dilaudid. Also continue her home MS Contin, oxycodone and tramadol when necessary -Zofran for nausea and vomiting -IV protonix bid 40 mg   Chest pain and SOB: Her shortness breath can be partially explained by the asthma, but etiology of her chest pain is not clear. She has bilateral lower leg tenderness and elevated d-dimer. Need to pulmonary embolism. Patient has poor IV access, cannot give IV contrast through 20 G IV line. Patient has mild leg edema. Her BNP is mildly elevated at 427, indicating possible CHF. Will not start diuretics until ruling out PE. -will get V/Q scan -LE venous Doppler to rule out DVT -No more IV from now  Asthma: no acute exacerbation -Dulera inhaler, prn albuterol nebs and Mucinex of a cough  Hypokalemia: K= 3.0 on admission. - Repleted - Check Mg level  HTN: -Hold lasix due to worsening renal function -Continue amlodipine, metoprolol  History of seizures: Last seizure was on 01/2014. Not on medications at home. -Observe closely with seizure precaution  DM-II: Last A1c 5.8 on 11/07/15, well controled. Patient is taking metformin at home -SSI  AKI: Likely due to prerenal secondary to dehydration and continuation of diruetics - IVF: 2l of NS given by  EDP - Check FeUrea - Follow up renal function by BMP - Avoid ACEI and NSAIDs - Hold lasix  Elevated troponin: this is a chronic issue. Her troponin has been elevated 0.03-0.09 since 01/25/15. It is less likely due to ACS -trend trop -Continue aspirin,  Lipitor and metoprolol  Left great toe fracture: X ray showed nondisplaced tuft fracture of the great toe. -pain control -may discuss with ortho or f/u with ortho   DVT ppx: SQ Lovenox  Code Status: Full code Family Communication: None at bed side.   Disposition Plan: Admit to inpatient   Date of Service 12/24/2015    Lorretta Harp [facility] Pager [phone]  If 7PM-7AM, please contact night-coverage [email or web address] Password [facility] 12/24/2015, 7:56 AM

## 2015-12-24 NOTE — ED Notes (Signed)
Patient transported to CT 

## 2015-12-24 NOTE — Progress Notes (Signed)
Orthopedic Tech Progress Note Patient Details:  Amber [name] 1956/03/30 [id number]  Ortho Devices Type of Ortho Device: Postop shoe/boot Ortho Device/Splint Interventions: Application   Saul Fordyce 12/24/2015, 1:14 PM

## 2015-12-24 NOTE — Progress Notes (Signed)
VASCULAR LAB PRELIMINARY  PRELIMINARY  PRELIMINARY  PRELIMINARY  Bilateral lower extremity venous duplex  completed.    Preliminary report:  Bilateral:  No evidence of DVT, superficial thrombosis, or Baker's Cyst.    Adriannah Steinkamp, RVT 12/24/2015, 12:07 PM

## 2015-12-24 NOTE — Care Management Note (Signed)
Case Management Note  Patient Details  Name: Amber [name] MRN: [id number] Date of Birth: 07/14/1956  Subjective/Objective:                    Action/Plan: Patient was admitted with Abdominal pain, nausea, vomiting, chest pain and shortness of breath.  Lives at home with granddaughter.  Will follow for discharge needs.  Expected Discharge Date:                  Expected Discharge Plan:     In-House Referral:     Discharge planning Services     Post Acute Care Choice:    Choice offered to:     DME Arranged:    DME Agency:     HH Arranged:    HH Agency:     Status of Service:  In process, will continue to follow  Medicare Important Message Given:    Date Medicare IM Given:    Medicare IM give by:    Date Additional Medicare IM Given:    Additional Medicare Important Message give by:     If discussed at Long Length of Stay Meetings, dates discussed:    Additional CommentsAnda Kraft, RN 12/24/2015, 2:12 PM [phone]

## 2015-12-24 NOTE — Progress Notes (Signed)
PT Cancellation Note  Patient Details Name: Amber [name] MRN: [id number] DOB: 26-Dec-1955   Cancelled Treatment:    Reason Eval/Treat Not Completed: Patient not medically ready;Medical issues which prohibited therapy (Awaiting doppler for calf pain).   Will try again later if time and test results allow.   Ivar Drape 12/24/2015, 9:28 AM   Samul Dada, PT MS Acute Rehab Dept. Number: [id number] [id number] and [facility] [phone]

## 2015-12-24 NOTE — Progress Notes (Addendum)
[facility]                                                                              Patient Demographics  Amber [name], is a 60 y.o. female, DOB - 08/02/1956, WLN:[id number]  Admit date - 12/23/2015   Admitting Physician Lorretta Harp, MD  Outpatient Primary MD for the patient is [name], MD  LOS - 0   Chief Complaint  Patient presents with  . Chest Pain  . Abdominal Pain       Brief HPI   Per Dr. Clyde Lundborg: On 12/24/15 admit note Amber [name] is a 60 y.o. female with PMH of chronically mild troponin elevation (0.03-0.09), hypertension, hyperlipidemia, diabetes mellitus, asthma, GERD, depression, seizure, arthritis, depression, who presented with nausea, vomiting, abdominal pain, chest pain and shortness of breath  Patient reported that she has been having abdominal pain, abdominal distention, nausea and vomiting for 4 days. She vomited 3 times, no hematemesis, no diarrhea. Abdominal pain located in epigastric area, radiating to left chest, 8/10, constant.  Patient also reported mild chest pain and shortness of breath. She attributes her shortness to asthma. She complained of bilateral lower leg pain, including the tenderness over calf areas. She has mild pain over left great toe.  In ED, patient was found to have WBC 9.5, temperature normal, slightly tachycardia, troponin 0.05, BNP 427, lipase 21, potassium is 3.0, creatinine 1.08 and positive d-dimer. Chest x-ray is negative for infiltration, but showed minimal bilateral pleural effusion. CT abdomen/pelvis showed possible chronic pancreatitis and duodenitis. X ray of left food showed nondisplaced tuft fracture of the great toe.   Assessment & Plan   Abdominal pain/nausea/vomiting: likely due to duodenitis and chronic pancreatitis and as evidenced by the CT abdomen/pelvis. Lipase normal. Patient endorses history of GERD, no prior endoscopies. - Continue IV PPI, nothing by mouth, IV fluids, antiemetics - Discussed with  gastroenterology, recommended PPI BID x month and follow with GI outpatient, patient can follow local GI in [place]. No need of endoscopy at this time. - Also possibility of constipation given chronic opioid use, on high doses of opioids - Placed on Linzess, MiraLAX daily  Chest pain and SOB: Her shortness breath can be partially explained by the asthma, but etiology of her chest pain is not clear. She has bilateral lower leg tenderness and elevated d-dimer.  - D-dimer elevated, obtain stat VQ scan to r/o pulmonary embolism.  -LE venous Doppler to rule out DVT  Asthma: no acute exacerbation -Continue Dulera inhaler, prn albuterol nebs and Mucinex of a cough  Hypokalemia: K= 3.0 on admission. - Replaced  Hypomagnesemia - Replaced IV  HTN: -Hold lasix due to worsening renal function, NPO -Continue amlodipine, metoprolol  History of seizures: Last seizure was on 01/2014. Not on medications at home. -Continue gabapentin. Will verify through pharmacy if patient is on Keppra outpatient  DM-II: Last A1c 5.8 on 11/07/15, well controled. Patient is taking metformin at home Continue sliding scale insulin  Acute kidney injury :  Likely due to prerenal secondary to dehydration and continuation of diruetics -Resolved,  Avoid ACEI and NSAIDs - Hold lasix  Elevated troponin: this is  a chronic issue. Her troponin has been elevated 0.03-0.09 since 01/25/15. It is less likely due to ACS -Obtain serial cardiac enzymes  -Continue aspirin, Lipitor and metoprolol  Left great toe fracture: X ray showed nondisplaced tuft fracture of the great toe. -pain control - Spoke with Dr. Magnus Ivan, orthopedics recommended postop shoe and weightbearing as tolerated, outpatient follow-up in 10 days.  Code Status: full code  Family Communication: Discussed in detail with the patient, all imaging results, lab results explained to the patient   Disposition Plan:  Time Spent in minutes  25 minutes  Procedures    CT abd   Consults   None   DVT Prophylaxis  Lovenox   Medications  Scheduled Meds: . amLODipine  5 mg Oral Daily  . aspirin EC  81 mg Oral Daily  . atorvastatin  10 mg Oral Daily  . diphenhydrAMINE  25 mg Intravenous Once  . enoxaparin (LOVENOX) injection  40 mg Subcutaneous Q24H  . folic acid  1 mg Oral Daily  . gabapentin  300 mg Oral BID  . guaiFENesin  600 mg Oral BID  . insulin aspart  0-9 Units Subcutaneous TID WC  . metoprolol succinate  25 mg Oral Daily  . mometasone-formoterol  2 puff Inhalation BID  . morphine  30 mg Oral Q12H  . oxyCODONE  20 mg Oral 6 times per day  . pantoprazole (PROTONIX) IV  40 mg Intravenous Q12H  . polyethylene glycol  17 g Oral Daily  . predniSONE  15 mg Oral Q breakfast  . sodium chloride flush  3 mL Intravenous Q12H  . tiZANidine  4 mg Oral QID  . [START ON 12/27/2015] Vitamin D (Ergocalciferol)  50,000 Units Oral Q Mon  . vitamin e  100 Units Oral Daily   Continuous Infusions:  PRN Meds:.acetaminophen **OR** acetaminophen, albuterol, diazepam, HYDROmorphone (DILAUDID) injection, Linaclotide, ondansetron (ZOFRAN) IV, traMADol   Antibiotics   Anti-infectives    None        Subjective:   Amber [name] was seen and examined today.  C/o nausea, abd pain. No vomiting or diarrhea or chest pain at the time of my encounter.  Patient denies dizziness,D/C, new weakness, numbess, tingling. No acute events overnight.    Objective:   Blood pressure 133/83, pulse 100, temperature 98.7 F (37.1 C), temperature source Oral, resp. rate 16, height 5' (1.524 m), weight 63.141 kg (139 lb 3.2 oz), SpO2 99 %.  Wt Readings from Last 3 Encounters:  12/24/15 63.141 kg (139 lb 3.2 oz)  11/09/15 61.78 kg (136 lb 3.2 oz)  08/24/15 66.2 kg (145 lb 15.1 oz)     Intake/Output Summary (Last 24 hours) at 12/24/15 1147 Last data filed at 12/24/15 1054  Gross per 24 hour  Intake    103 ml  Output    500 ml  Net   -397 ml    Exam  General:  Alert and oriented x 3, NAD  HEENT:  PERRLA, EOMI, Anicteric Sclera, mucous membranes moist.   Neck: Supple, no JVD, no masses  CVS: S1 S2 auscultated, no rubs, murmurs or gallops. Regular rate and rhythm.  Respiratory: Clear to auscultation bilaterally, no wheezing, rales or rhonchi  Abdomen: Soft, mild epigastric TTP, nondistended, + bowel sounds  Ext: no cyanosis clubbing or edema  Neuro: AAOx3, Cr N's II- XII. Strength 5/5 upper and lower extremities bilaterally  Skin: No rashes  Psych: Normal affect and demeanor, alert and oriented x3    Data Review   Micro  Results No results found for this or any previous visit (from the past 240 hour(s)).  Radiology Reports Ct Abdomen Pelvis Wo Contrast  12/24/2015  CLINICAL DATA:  Use abdominal pain.  Bloating for 4 days. EXAM: CT ABDOMEN AND PELVIS WITHOUT CONTRAST TECHNIQUE: Multidetector CT imaging of the abdomen and pelvis was performed following the standard protocol without IV contrast. COMPARISON:  11/09/2015 FINDINGS: Lower chest and abdominal wall:  Fatty left groin hernia. Hepatobiliary: Mild lobulated liver surface without definitive cirrhosis. Cholecystectomy with normal common bile duct diameter. Pancreas: Atrophic with calcifications. No duct enlargement. No primary inflammation suspected. Spleen: Unremarkable. Adrenals/Urinary Tract: Negative adrenals. No hydronephrosis or stone. Full bladder without acute superimposed finding. Reproductive:No pathologic findings. Stomach/Bowel: There is thickening and fat edema around the distal duodenum consistent with duodenitis. No discrete ulceration is seen; there are small diverticula but no focal diverticular inflammation. Rapid development since prior is supportive of a primary inflammatory process. Moderate colonic diverticulosis without inflammation. No pericecal inflammation. Patient has history of appendectomy. Vascular/Lymphatic: No acute vascular abnormality. No mass or adenopathy.  Peritoneal: No ascites or pneumoperitoneum. Musculoskeletal: No acute abnormalities. Remote T12 inferior endplate fracture. Disc and facet degeneration focally advanced at L4-5 where there is mild anterolisthesis. IMPRESSION: 1. Distal duodenitis. 2. Chronic pancreatitis. 3. Incidental findings are stable and described above. Electronically Signed   By: Marnee Spring M.D.   On: 12/24/2015 02:15   Dg Chest 2 View  12/23/2015  CLINICAL DATA:  Abdominal pain and distention for 4 days. Chest pain for 2 days. EXAM: CHEST  2 VIEW COMPARISON:  December 17 26 FINDINGS: The heart size and mediastinal contours are stable. There is no focal pneumonia or pulmonary edema. There are minimal bilateral pleural effusions. The visualized skeletal structures are stable. Patient status post prior posterior fusion of spine. Chronic posttraumatic changes right ribs are stable. IMPRESSION: Minimal bilateral pleural effusions. No focal pneumonia or pulmonary edema. Electronically Signed   By: Sherian Rein M.D.   On: 12/23/2015 20:54   Dg Abd 2 Views  12/23/2015  CLINICAL DATA:  Abdominal distention for 1 week EXAM: ABDOMEN - 2 VIEW COMPARISON:  11/09/2015 FINDINGS: The bowel gas pattern is normal. There is no evidence of free air. No radio-opaque calculi or other significant radiographic abnormality is seen. IMPRESSION: Negative. Electronically Signed   By: Signa Kell M.D.   On: 12/23/2015 22:23   Dg Toe Great Left  12/23/2015  CLINICAL DATA:  Dropped frozen chicken on left great toe 4 days ago with pain and swelling. Initial encounter. EXAM: LEFT GREAT TOE COMPARISON:  None. FINDINGS: Nondisplaced tuft fracture of the great toe.  No dislocation. Premature arterial calcification. Osteopenia. IMPRESSION: Nondisplaced tuft fracture of the great toe. Electronically Signed   By: Marnee Spring M.D.   On: 12/23/2015 22:23    CBC  Recent Labs Lab 12/23/15 2040 12/24/15 0449  WBC 9.5 8.9  HGB 11.1* 11.3*  HCT 34.4* 34.2*    PLT 220 157  MCV 77.7* 77.2*  MCH 25.1* 25.5*  MCHC 32.3 33.0  RDW 17.7* 18.1*    Chemistries   Recent Labs Lab 12/23/15 2040 12/24/15 0449  NA 138 148*  K 3.0* 4.2  CL 103 111  CO2 22 22  GLUCOSE 146* 96  BUN 14 11  CREATININE 1.08* 0.84  CALCIUM 8.6* 8.3*  MG  --  1.3*  AST 23 28  ALT 19 17  ALKPHOS 92 80  BILITOT 0.9 0.9   ------------------------------------------------------------------------------------------------------------------ estimated creatinine clearance is 61.2  mL/min (by C-G formula based on Cr of 0.84). ------------------------------------------------------------------------------------------------------------------ No results for input(s): HGBA1C in the last 72 hours. ------------------------------------------------------------------------------------------------------------------ No results for input(s): CHOL, HDL, LDLCALC, TRIG, CHOLHDL, LDLDIRECT in the last 72 hours. ------------------------------------------------------------------------------------------------------------------ No results for input(s): TSH, T4TOTAL, T3FREE, THYROIDAB in the last 72 hours.  Invalid input(s): FREET3 ------------------------------------------------------------------------------------------------------------------ No results for input(s): VITAMINB12, FOLATE, FERRITIN, TIBC, IRON, RETICCTPCT in the last 72 hours.  Coagulation profile  Recent Labs Lab 12/24/15 0448  INR 1.09     Recent Labs  12/24/15 0447  DDIMER 2.29*    Cardiac Enzymes  Recent Labs Lab 12/23/15 2040 12/24/15 0448 12/24/15 0800  TROPONINI 0.05* 0.03 0.03   ------------------------------------------------------------------------------------------------------------------ Invalid input(s): POCBNP   Recent Labs  12/23/15 2026 12/24/15 0646 12/24/15 1106  GLUCAP 133* 88 143*     RAI,RIPUDEEP M.D. [facility] 12/24/2015, 11:47 AM  Pager: [phone] Between 7am to 7pm  - call Pager - [phone]  After 7pm go to [email or web address] - password [email or web address]  Call night coverage person covering after 7pm

## 2015-12-24 NOTE — Progress Notes (Signed)
Pharmacy  Re:  Keppra  Requested to determine whether patient is on Keppra as outpatient. Had been on Keppra 500 mg BID during prior admissions, most recently 12/17-12/21/2017. But not included on discharge list 11/09/16.  Per [facility] prescription records, last filled on 01/27/15 for 30-day supply. Discussed with patient, who reports that she does not use another pharmacy. She also reported that she has received Keppra while inpatient, but has not been given a prescription at discharge and has been unable to obtain a supply.    2016 admissions:  March 8-9, July 23-25, September 6-12, October 4-5 and December 17-21.  Keppra was given during each of those admissions.  She reports prescriber is Dr. [name] at [facility] on [address] [facility] [address] [phone]), but I was unable to speak with anyone at the practice to confirm.  Waited on hold for some time.  She reports no recent seizures.  Nicolette Bang, RPh Pager: [phone] 12/24/2015 5:05 PM

## 2015-12-25 ENCOUNTER — Inpatient Hospital Stay (HOSPITAL_COMMUNITY): Payer: Medicaid Other

## 2015-12-25 DIAGNOSIS — R079 Chest pain, unspecified: Secondary | ICD-10-CM

## 2015-12-25 DIAGNOSIS — Z8669 Personal history of other diseases of the nervous system and sense organs: Secondary | ICD-10-CM

## 2015-12-25 LAB — BASIC METABOLIC PANEL
ANION GAP: 9 (ref 5–15)
BUN: 9 mg/dL (ref 6–20)
CHLORIDE: 110 mmol/L (ref 101–111)
CO2: 20 mmol/L — AB (ref 22–32)
CREATININE: 0.87 mg/dL (ref 0.44–1.00)
Calcium: 8.3 mg/dL — ABNORMAL LOW (ref 8.9–10.3)
GFR calc non Af Amer: >60 mL/min (ref >60)
Glucose, Bld: 173 mg/dL — ABNORMAL HIGH (ref 65–99)
POTASSIUM: 4.1 mmol/L (ref 3.5–5.1)
SODIUM: 139 mmol/L (ref 135–145)

## 2015-12-25 LAB — CBC
HEMATOCRIT: 27.9 % — AB (ref 36.0–46.0)
HEMOGLOBIN: 9.1 g/dL — AB (ref 12.0–15.0)
MCH: 25.3 pg — AB (ref 26.0–34.0)
MCHC: 32.6 g/dL (ref 30.0–36.0)
MCV: 77.7 fL — AB (ref 78.0–100.0)
PLATELETS: 171 10*3/uL (ref 150–400)
RBC: 3.59 MIL/uL — AB (ref 3.87–5.11)
RDW: 17.9 % — ABNORMAL HIGH (ref 11.5–15.5)
WBC: 7.4 10*3/uL (ref 4.0–10.5)

## 2015-12-25 LAB — MAGNESIUM: Magnesium: 2.4 mg/dL (ref 1.7–2.4)

## 2015-12-25 LAB — GLUCOSE, CAPILLARY
GLUCOSE-CAPILLARY: 194 mg/dL — AB (ref 65–99)
Glucose-Capillary: 125 mg/dL — ABNORMAL HIGH (ref 65–99)

## 2015-12-25 LAB — UREA NITROGEN, URINE: UREA NITROGEN UR: 220 mg/dL

## 2015-12-25 MED ORDER — OXYMORPHONE HCL ER 40 MG PO TB12: 40.0000 mg | ORAL_TABLET | Freq: Two times a day (BID) | ORAL | Status: DC

## 2015-12-25 MED ORDER — PANTOPRAZOLE SODIUM 40 MG PO TBEC: 40.0000 mg | DELAYED_RELEASE_TABLET | Freq: Two times a day (BID) | ORAL | Status: DC

## 2015-12-25 MED ORDER — TRAMADOL HCL 50 MG PO TABS: 50.0000 mg | ORAL_TABLET | Freq: Three times a day (TID) | ORAL | Status: DC | PRN

## 2015-12-25 MED ORDER — PREDNISONE 5 MG PO TABS
15.0000 mg | ORAL_TABLET | Freq: Every day | ORAL | Status: DC
  Administered 2015-12-25: 15 mg via ORAL
  Filled 2015-12-25: qty 1

## 2015-12-25 MED ORDER — OXYCODONE HCL 20 MG PO TABS: 20.0000 mg | ORAL_TABLET | ORAL | Status: DC

## 2015-12-25 NOTE — Progress Notes (Signed)
Pharmacy consulted on 12/24/15 to assist in determining whether patient is or should be chronically receiving Keppra. Today I was able to speak with an RN Darl Pikes) who works with Dr. Wynelle Link at [facility] on [facility] [address] [facility] [phone]). Darl Pikes stated that Ms. Benevides recently had an appointment to obtain refills on her chronic pain medications, diazepam and muscle relaxer's but not Keppra. Dr. Chase Caller office has no active record of ever supplying the patient with an rx for Keppra and could not find an indication for her to require. Above was communicated with Dr. Isidoro Donning. Will s/o; please re consult if can be of further assistance.   Pollyann Samples, PharmD, BCPS 12/25/2015, 2:39 PM Pager: [phone]

## 2015-12-25 NOTE — Progress Notes (Signed)
Nurse has discontinued telemetry, centralized telemetry notified.  IV discontinued and patient verbalized discharge instructions.  No further questions at this time.  Lorretta Harp RN

## 2015-12-25 NOTE — Evaluation (Signed)
Physical Therapy Evaluation Patient Details Name: Amber [name] MRN: [id number] DOB: 02-26-1956 Today's Date: 12/25/2015   History of Present Illness  60 y.o. female who presents to the Emergency Department complaining of acute onset, constant SOB  Clinical Impression  Patient using Rw for mobility, no physical assist required. Patient educated on safety with mobility, technique for positioning and pain management for LLE. Patient receptive. Feel patient would benefit from HHPT to improve overall strength and conditioning, however, patient states that she has exhausted her benefits through medicaid. Patient states that she has assist at home if needed. Anticipate patient will be safe for d/c home with family assist.    Follow Up Recommendations Supervision for mobility/OOB (pt states that she does not have HHPT coverage)    Equipment Recommendations  None recommended by PT    Recommendations for Other Services       Precautions / Restrictions Precautions Precautions: Fall Required Braces or Orthoses: Other Brace/Splint Other Brace/Splint: post op shoe LLE Restrictions Weight Bearing Restrictions: No      Mobility  Bed Mobility Overal bed mobility: Needs Assistance Bed Mobility: Supine to Sit;Sit to Supine     Supine to sit: Supervision Sit to supine: Supervision   General bed mobility comments: Vcs for positioning, increased effort to come to EOB  Transfers Overall transfer level: Needs assistance Equipment used: Rolling walker (2 wheeled) Transfers: Sit to/from Stand Sit to Stand: Min guard         General transfer comment: VCs for hand placement, increased time to elevate to standing, increased pain with weight bearing, no physical assst required.  Ambulation/Gait Ambulation/Gait assistance: Supervision Ambulation Distance (Feet): 100 Feet Assistive device: Rolling walker (2 wheeled) Gait Pattern/deviations: Step-through pattern;Decreased step length -  left;Decreased weight shift to left;Antalgic;Trunk flexed Gait velocity: decreased Gait velocity interpretation: Below normal speed for age/gender General Gait Details: mobilizing well despite pain, heavy reliance on RW. VCs for upright posture and sequencing with LLE  Stairs            Wheelchair Mobility    Modified Rankin (Stroke Patients Only)       Balance Overall balance assessment:  (reliance on RW for UE support in static standing)                                           Pertinent Vitals/Pain Pain Assessment: 0-10 Pain Score: 6  Pain Location: left foot Pain Descriptors / Indicators: Aching;Grimacing;Guarding Pain Intervention(s): Monitored during session;Patient requesting pain meds-RN notified    Home Living Family/patient expects to be discharged to:: Private residence Living Arrangements: Other relatives (granddaughter) Available Help at Discharge: Available 24 hours/day;Family Type of Home: House Home Access: Level entry     Home Layout: One level Home Equipment: Environmental consultant - 2 wheels      Prior Function Level of Independence: Independent with assistive device(s)         Comments: amb with rollator and history of falls     Hand Dominance   Dominant Hand: Right    Extremity/Trunk Assessment   Upper Extremity Assessment: Defer to OT evaluation           Lower Extremity Assessment: Generalized weakness (left toe fracture, post op shoe during mobility)         Communication   Communication: No difficulties  Cognition Arousal/Alertness: Awake/alert Behavior During Therapy: WFL for tasks assessed/performed Overall Cognitive Status: Within  Functional Limits for tasks assessed                      General Comments      Exercises        Assessment/Plan    PT Assessment Patent does not need any further PT services  PT Diagnosis Difficulty walking;Acute pain;Abnormality of gait   PT Problem List    PT  Treatment Interventions     PT Goals (Current goals can be found in the Care Plan section) Acute Rehab PT Goals Patient Stated Goal: to go home PT Goal Formulation: All assessment and education complete, DC therapy Time For Goal Achievement: January 08, 2016    Frequency     Barriers to discharge        Co-evaluation               End of Session Equipment Utilized During Treatment: Gait belt Activity Tolerance: Patient tolerated treatment well Patient left: in bed;with call bell/phone within reach;with family/visitor present Nurse Communication: Mobility status         Time: 6283-6629 PT Time Calculation (min) (ACUTE ONLY): 17 min   Charges:   PT Evaluation $PT Eval Moderate Complexity: 1 Procedure     PT G CodesFabio Asa January 08, 2016, 4:25 PM Charlotte Crumb, PT DPT  [phone]

## 2015-12-25 NOTE — Discharge Summary (Signed)
Physician Discharge Summary   Patient ID: Amber [name] MRN: [id number] DOB/AGE: November 13, 1956 60 y.o.  Admit date: 12/23/2015 Discharge date: 12/25/2015  Primary Care Physician:  [name], MD  Discharge Diagnoses:    . Atypical chest pain, abdominal pain  .  Nausea, vomiting  . Hypokalemia . Hypertension . DM type 2 causing complication (HCC) . Elevated troponin . AKI (acute kidney injury) (HCC)  Consults:  None  Recommendations for Outpatient Follow-up:  1. Patient was recommended Protonix 40 mg twice a day for one month and referral by her PCP to gastroenterology in [facility] 2. Please repeat CBC/BMET at next visit   DIET: Carb modified diet    Allergies:   Allergies  Allergen Reactions  . Ivp Dye [Iodinated Diagnostic Agents] Itching     DISCHARGE MEDICATIONS: Current Discharge Medication List    START taking these medications   Details  pantoprazole (PROTONIX) 40 MG tablet Take 1 tablet (40 mg total) by mouth 2 (two) times daily before a meal. Qty: 60 tablet, Refills: 3      CONTINUE these medications which have CHANGED   Details  Oxycodone HCl 20 MG TABS Take 1 tablet (20 mg total) by mouth every 4 (four) hours. Refill by primary MD Qty: 10 tablet, Refills: 0    oxymorphone (OPANA ER) 40 MG 12 hr tablet Take 1 tablet (40 mg total) by mouth every 12 (twelve) hours. Refill by primary MD Qty: 10 tablet, Refills: 0    traMADol (ULTRAM) 50 MG tablet Take 1 tablet (50 mg total) by mouth every 8 (eight) hours as needed for moderate pain. For arthritis pain Qty: 30 tablet, Refills: 0      CONTINUE these medications which have NOT CHANGED   Details  albuterol (PROVENTIL HFA;VENTOLIN HFA) 108 (90 BASE) MCG/ACT inhaler Inhale 2 puffs into the lungs every 6 (six) hours as needed for wheezing or shortness of breath.    amLODipine (NORVASC) 5 MG tablet Take 5 mg by mouth daily.    aspirin EC 81 MG tablet Take 1 tablet (81 mg total) by mouth daily. Qty: 30 tablet,  Refills: 2    atorvastatin (LIPITOR) 10 MG tablet Take 10 mg by mouth daily. Refills: 5    diazepam (VALIUM) 5 MG tablet Take 1 tablet (5 mg total) by mouth every 12 (twelve) hours as needed for muscle spasms. Qty: 10 tablet, Refills: 0    Fluticasone-Salmeterol (ADVAIR) 250-50 MCG/DOSE AEPB Inhale 1 puff into the lungs 2 (two) times daily.    folic acid (FOLVITE) 1 MG tablet Take 1 mg by mouth daily.    furosemide (LASIX) 40 MG tablet Take 1 tablet (40 mg total) by mouth daily. Qty: 30 tablet, Refills: 1    gabapentin (NEURONTIN) 300 MG capsule Take 1 capsule (300 mg total) by mouth 2 (two) times daily. Qty: 60 capsule, Refills: 0    LINZESS 145 MCG CAPS capsule Take 145 mcg by mouth daily as needed.  Refills: 11    metFORMIN (GLUCOPHAGE-XR) 500 MG 24 hr tablet Take 500 mg by mouth daily with breakfast.    metoprolol succinate (TOPROL-XL) 25 MG 24 hr tablet Take 25 mg by mouth daily.    polyethylene glycol (MIRALAX / GLYCOLAX) packet Take 17 g by mouth 2 (two) times daily. Qty: 14 each, Refills: 0    potassium chloride SA (K-DUR,KLOR-CON) 20 MEQ tablet Take 2 tablets (40 mEq total) by mouth daily. Qty: 30 tablet, Refills: 0    predniSONE (DELTASONE) 5 MG tablet Take 15  mg by mouth daily. Refills: 2    promethazine (PHENERGAN) 25 MG tablet Take 1 tablet by mouth as needed. nausea Refills: 0    tiZANidine (ZANAFLEX) 4 MG tablet Take 4 mg by mouth 4 (four) times daily. Refills: 2    Vitamin D, Ergocalciferol, (DRISDOL) 50000 UNITS CAPS capsule Take 1 capsule (50,000 Units total) by mouth every 7 (seven) days. Sunday Qty: 30 capsule, Refills: 0    VITAMIN E PO Take 1 capsule by mouth daily.    feeding supplement, ENSURE ENLIVE, (ENSURE ENLIVE) LIQD Take 237 mLs by mouth 2 (two) times daily between meals. Qty: 60 Bottle, Refills: 0      STOP taking these medications     esomeprazole (NEXIUM) 40 MG capsule      amoxicillin-clavulanate (AUGMENTIN) 875-125 MG tablet           Brief H and P: For complete details please refer to admission H and P, but in brief Per Dr. Clyde Lundborg: On 12/24/15 admit note Amber [name] is a 60 y.o. female with PMH of chronically mild troponin elevation (0.03-0.09), hypertension, hyperlipidemia, diabetes mellitus, asthma, GERD, depression, seizure, arthritis, depression, who presented with nausea, vomiting, abdominal pain, chest pain and shortness of breath  Patient reported that she has been having abdominal pain, abdominal distention, nausea and vomiting for 4 days. She vomited 3 times, no hematemesis, no diarrhea. Abdominal pain located in epigastric area, radiating to left chest, 8/10, constant. Patient also reported mild chest pain and shortness of breath. She attributes her shortness to asthma. She complained of bilateral lower leg pain, including the tenderness over calf areas. She has mild pain over left great toe.  In ED, patient was found to have WBC 9.5, temperature normal, slightly tachycardia, troponin 0.05, BNP 427, lipase 21, potassium is 3.0, creatinine 1.08 and positive d-dimer. Chest x-ray is negative for infiltration, but showed minimal bilateral pleural effusion. CT abdomen/pelvis showed possible chronic pancreatitis and duodenitis. X ray of left food showed nondisplaced tuft fracture of the great toe.   Hospital Course:   Abdominal pain/nausea/vomiting: likely due to duodenitis and chronic pancreatitis and as evidenced by the CT abdomen/pelvis. Lipase normal. Patient endorses history of GERD, no prior endoscopies. -Patient was placed on IV PPI,  IV fluids, antiemetics - Discussed with gastroenterology, Jennye Moccasin, recommended PPI BID x month and follow with GI outpatient, patient can follow local GI in [place]. No need of endoscopy at this time. - Also possibility of constipation given chronic opioid use, on high doses of opioids - Placed on Linzess, MiraLAX daily  Chest pain and SOB: Her shortness breath can be  partially explained by the asthma, and chest pain also atypical likely from duodenitis, patient describes the chest pain as burning, also somewhat musculoskeletal. Patient has long-standing history of chronic pain syndrome and follows pain clinic. - D-dimer elevated, VQ scan was low probability for pulmonary embolism.   -LE venous Doppler was negative for DVT - 2-D echo showed EF 70-75%, grade 1 diastolic function.  Asthma: no acute exacerbation -Continue Dulera inhaler, prn albuterol nebs and Mucinex of a cough  Hypokalemia: K= 3.0 on admission. - Replaced  Hypomagnesemia - Replaced IV  HTN: -Hold lasix due to worsening renal function, NPO -Continue amlodipine, metoprolol  History of seizures: Last seizure was on 01/2014. Not on medications at home. -Continue gabapentin. Unable to contact her PCP if patient is still on Keppra or not. Patient was instructed to follow-up with her PCP regarding Keppra.  DM-II: Last A1c  5.8 on 11/07/15, well controled. Patient is taking metformin at home  Acute kidney injury : Likely due to prerenal secondary to dehydration and continuation of diruetics -Resolved, Avoid NSAIDs  Elevated troponin: this is a chronic issue. Her troponin has been elevated 0.03-0.09 since 01/25/15. It is less likely due to ACS -Continue aspirin, Lipitor and metoprolol 2D echo showed EF 70-75%, no regional wall motion abnormalities   Left great toe fracture: X ray showed nondisplaced tuft fracture of the great toe. -pain control - Spoke with Dr. Magnus Ivan, orthopedics recommended postop shoe and weightbearing as tolerated, outpatient follow-up in 10 days.  Day of Discharge BP 130/76 mmHg  Pulse 113  Temp(Src) 99 F (37.2 C) (Oral)  Resp 18  Ht 5' (1.524 m)  Wt 65.862 kg (145 lb 3.2 oz)  BMI 28.36 kg/m2  SpO2 100%  Physical Exam: General: Alert and awake oriented x3 not in any acute distress. HEENT: anicteric sclera, pupils reactive to light and  accommodation CVS: S1-S2 clear no murmur rubs or gallops Chest: clear to auscultation bilaterally, no wheezing rales or rhonchi Abdomen: soft nontender, nondistended, normal bowel sounds Extremities: no cyanosis, clubbing or edema noted bilaterally Neuro: Cranial nerves II-XII intact, no focal neurological deficits   The results of significant diagnostics from this hospitalization (including imaging, microbiology, ancillary and laboratory) are listed below for reference.    LAB RESULTS: Basic Metabolic Panel:  Recent Labs Lab 12/24/15 0449 12/25/15 0312  NA 148* 139  K 4.2 4.1  CL 111 110  CO2 22 20*  GLUCOSE 96 173*  BUN 11 9  CREATININE 0.84 0.87  CALCIUM 8.3* 8.3*  MG 1.3* 2.4   Liver Function Tests:  Recent Labs Lab 12/23/15 2040 12/24/15 0449  AST 23 28  ALT 19 17  ALKPHOS 92 80  BILITOT 0.9 0.9  PROT 6.1* 5.2*  ALBUMIN 3.0* 2.5*    Recent Labs Lab 12/23/15 2040  LIPASE 21   No results for input(s): AMMONIA in the last 168 hours. CBC:  Recent Labs Lab 12/24/15 0449 12/25/15 0312  WBC 8.9 7.4  HGB 11.3* 9.1*  HCT 34.2* 27.9*  MCV 77.2* 77.7*  PLT 157 171   Cardiac Enzymes:  Recent Labs Lab 12/24/15 0800 12/24/15 1623  TROPONINI 0.03 0.04*   BNP: Invalid input(s): POCBNP CBG:  Recent Labs Lab 12/25/15 0617 12/25/15 1151  GLUCAP 125* 194*    Significant Diagnostic Studies:  Ct Abdomen Pelvis Wo Contrast  12/24/2015  CLINICAL DATA:  Use abdominal pain.  Bloating for 4 days. EXAM: CT ABDOMEN AND PELVIS WITHOUT CONTRAST TECHNIQUE: Multidetector CT imaging of the abdomen and pelvis was performed following the standard protocol without IV contrast. COMPARISON:  11/09/2015 FINDINGS: Lower chest and abdominal wall:  Fatty left groin hernia. Hepatobiliary: Mild lobulated liver surface without definitive cirrhosis. Cholecystectomy with normal common bile duct diameter. Pancreas: Atrophic with calcifications. No duct enlargement. No primary  inflammation suspected. Spleen: Unremarkable. Adrenals/Urinary Tract: Negative adrenals. No hydronephrosis or stone. Full bladder without acute superimposed finding. Reproductive:No pathologic findings. Stomach/Bowel: There is thickening and fat edema around the distal duodenum consistent with duodenitis. No discrete ulceration is seen; there are small diverticula but no focal diverticular inflammation. Rapid development since prior is supportive of a primary inflammatory process. Moderate colonic diverticulosis without inflammation. No pericecal inflammation. Patient has history of appendectomy. Vascular/Lymphatic: No acute vascular abnormality. No mass or adenopathy. Peritoneal: No ascites or pneumoperitoneum. Musculoskeletal: No acute abnormalities. Remote T12 inferior endplate fracture. Disc and facet degeneration focally advanced at  L4-5 where there is mild anterolisthesis. IMPRESSION: 1. Distal duodenitis. 2. Chronic pancreatitis. 3. Incidental findings are stable and described above. Electronically Signed   By: Marnee Spring M.D.   On: 12/24/2015 02:15   Dg Chest 2 View  12/23/2015  CLINICAL DATA:  Abdominal pain and distention for 4 days. Chest pain for 2 days. EXAM: CHEST  2 VIEW COMPARISON:  December 17 26 FINDINGS: The heart size and mediastinal contours are stable. There is no focal pneumonia or pulmonary edema. There are minimal bilateral pleural effusions. The visualized skeletal structures are stable. Patient status post prior posterior fusion of spine. Chronic posttraumatic changes right ribs are stable. IMPRESSION: Minimal bilateral pleural effusions. No focal pneumonia or pulmonary edema. Electronically Signed   By: Sherian Rein M.D.   On: 12/23/2015 20:54   Nm Pulmonary Perf And Vent  12/24/2015  CLINICAL DATA:  Elevated D-dimer. Chest pain and shortness of breath. EXAM: NUCLEAR MEDICINE VENTILATION - PERFUSION LUNG SCAN TECHNIQUE: Ventilation images were obtained in multiple projections  using inhaled aerosol Tc-62m DTPA. Perfusion images were obtained in multiple projections after intravenous injection of Tc-26m MAA. RADIOPHARMACEUTICALS:  30.0 Technetium-80m DTPA aerosol inhalation and 4.0 Technetium-28m MAA IV COMPARISON:  None. FINDINGS: Ventilation: No focal ventilation defect. Perfusion: No wedge shaped peripheral perfusion defects to suggest acute pulmonary embolism. There is a photopenic defect identified in the posterior left upper lobe which likely reflects attenuation artifact from spinal hardware. IMPRESSION: Low probability for acute pulmonary embolus. Electronically Signed   By: Signa Kell M.D.   On: 12/24/2015 16:23   Dg Abd 2 Views  12/23/2015  CLINICAL DATA:  Abdominal distention for 1 week EXAM: ABDOMEN - 2 VIEW COMPARISON:  11/09/2015 FINDINGS: The bowel gas pattern is normal. There is no evidence of free air. No radio-opaque calculi or other significant radiographic abnormality is seen. IMPRESSION: Negative. Electronically Signed   By: Signa Kell M.D.   On: 12/23/2015 22:23   Dg Toe Great Left  12/23/2015  CLINICAL DATA:  Dropped frozen chicken on left great toe 4 days ago with pain and swelling. Initial encounter. EXAM: LEFT GREAT TOE COMPARISON:  None. FINDINGS: Nondisplaced tuft fracture of the great toe.  No dislocation. Premature arterial calcification. Osteopenia. IMPRESSION: Nondisplaced tuft fracture of the great toe. Electronically Signed   By: Marnee Spring M.D.   On: 12/23/2015 22:23    2D ECHO: Study Conclusions  - Left ventricle: The cavity size was normal. Wall thickness was increased in a pattern of mild LVH. Systolic function was vigorous. The estimated ejection fraction was in the range of 70% to 75%. Wall motion was normal; there were no regional wall motion abnormalities. Doppler parameters are consistent with abnormal left ventricular relaxation (grade 1 diastolic dysfunction). - Aortic valve: Mildly calcified annulus.  Probably trileaflet. - Mitral valve: Calcified annulus. There was trivial regurgitation. - Left atrium: The atrium was mildly dilated. - Right atrium: Central venous pressure (est): 3 mm Hg. - Tricuspid valve: There was trivial regurgitation. - Pulmonary arteries: Systolic pressure could not be accurately estimated. - Pericardium, extracardiac: A prominent pericardial fat pad was present.  Impressions:  - Mild LVH with LVEF 70-75% and grade 1 diastolic dysfunction with equivocal LV filling pressure. Mild left atrial enlargement. MAC with trivial mitral regurgitation. Mildly calcified aortic annulus. Trivial tricuspid regurgitation, unable to estimate PASP. Prominent pericardial fat pad.  Disposition and Follow-up: Discharge Instructions    Discharge instructions    Complete by:  As directed   Please verify  about Keppra (seizure medication) with your primary physician, Dr Wynelle Link.  Please continue Protonix twice a day and follow up with your doctor for GI referral            DISPOSITION: Home  DISCHARGE FOLLOW-UP Follow-up Information    Follow up with SUN,YUN, MD. Schedule an appointment as soon as possible for a visit in 10 days.   Specialty:  Internal Medicine   Why:  for hospital follow-up, you will need referral to GI    Contact information:   [address] [place] [address] [phone]       Follow up with Kathryne Hitch, MD. Schedule an appointment as soon as possible for a visit in 10 days.   Specialty:  Orthopedic Surgery   Why:  for hospital follow-up for left great toe fracture   Contact information:   [address] [place] [address] [phone]        Time spent on Discharge: 35 minutes  Signed:   Ejay Lashley M.D. [facility] 12/25/2015, 1:05 PM Pager: [phone]

## 2015-12-25 NOTE — Progress Notes (Signed)
  Echocardiogram 2D Echocardiogram has been performed.  Amber [name] 12/25/2015, 11:15 AM

## 2015-12-26 IMAGING — CT CT ABD-PELV W/O CM
4 of 5 series · 10 of 46 positions shown, 15 images · non-contrast
Comparison: 01/25/2015 chest CT.  Abdominal CT 09/27/2015

CLINICAL DATA: Pulmonary infiltrates. Chest pain with vomiting for
3 days.

EXAM:
CT CHEST, ABDOMEN AND PELVIS WITHOUT CONTRAST
TECHNIQUE: Multidetector CT imaging of the chest, abdomen and pelvis was
performed following the standard protocol without IV contrast.

[Series 2: cap w/o 5.0 mm st · axial · non-contrast · 0.89mm/px · z∈[-394,-204]mm · 2 of 116 slices shown, 5 images]
[im 39/116  soft-tissue]
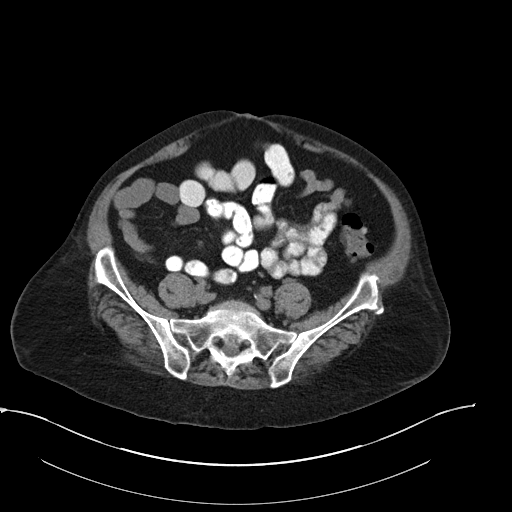
[im 39/116  lung]
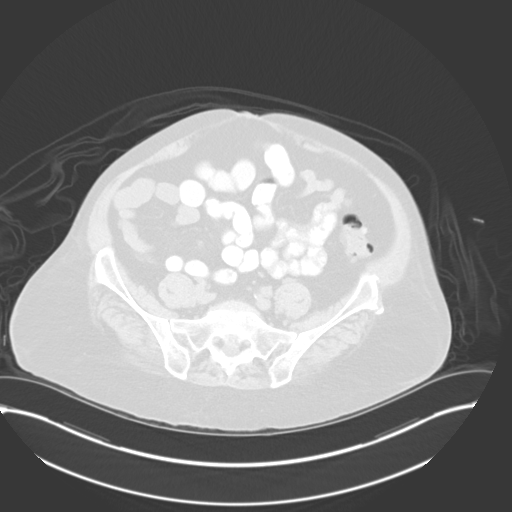
[im 39/116  bone]
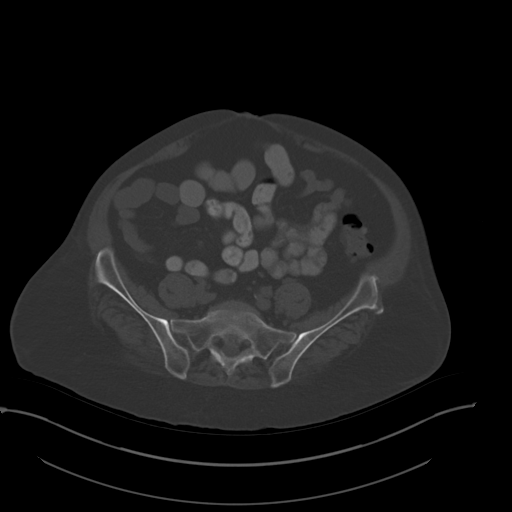
[im 77/116  soft-tissue]
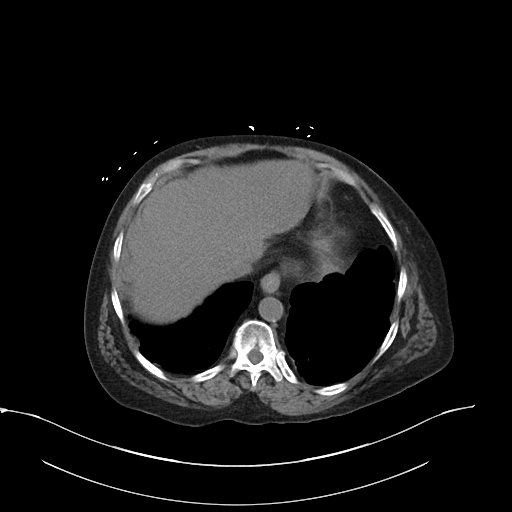
[im 77/116  lung]
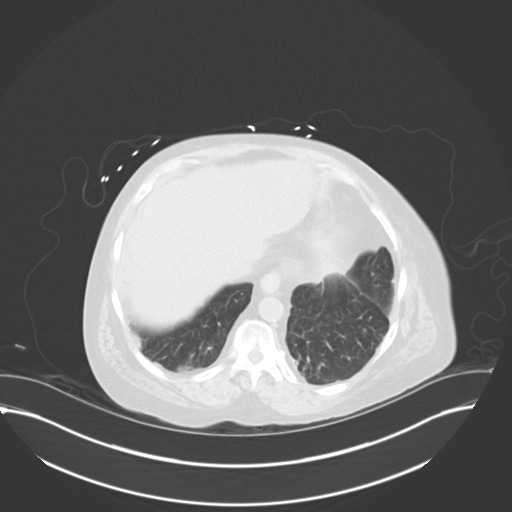

[Series 4: cap w/o 3.0 mm st cor · coronal · non-contrast · 0.68mm/px · 3 of 91 slices shown, 4 images]
[im 31/91  soft-tissue]
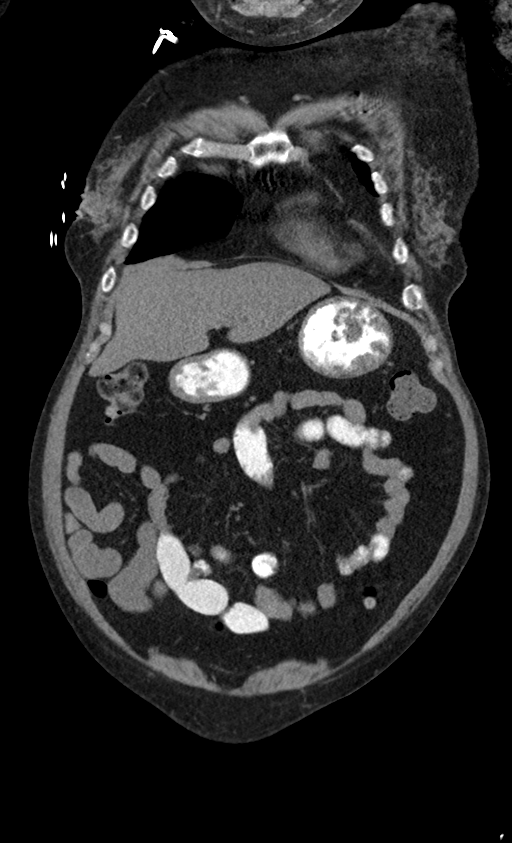
[im 41/91  soft-tissue]
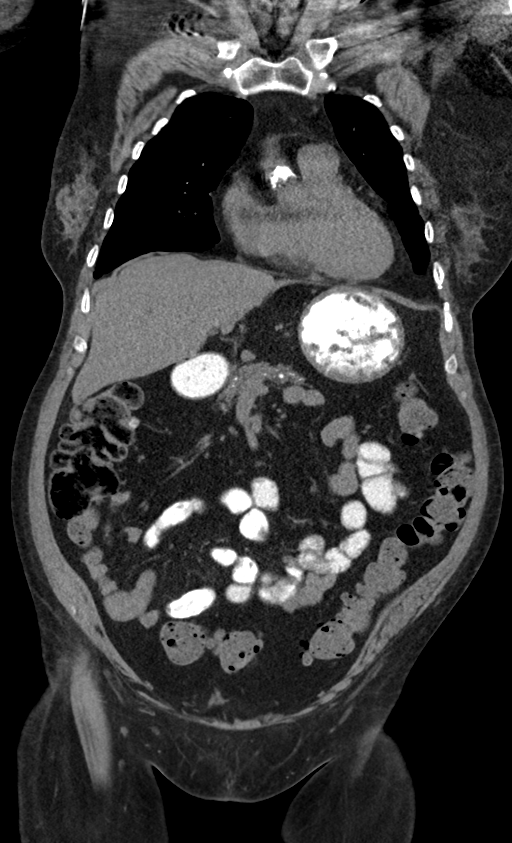
[im 41/91  bone]
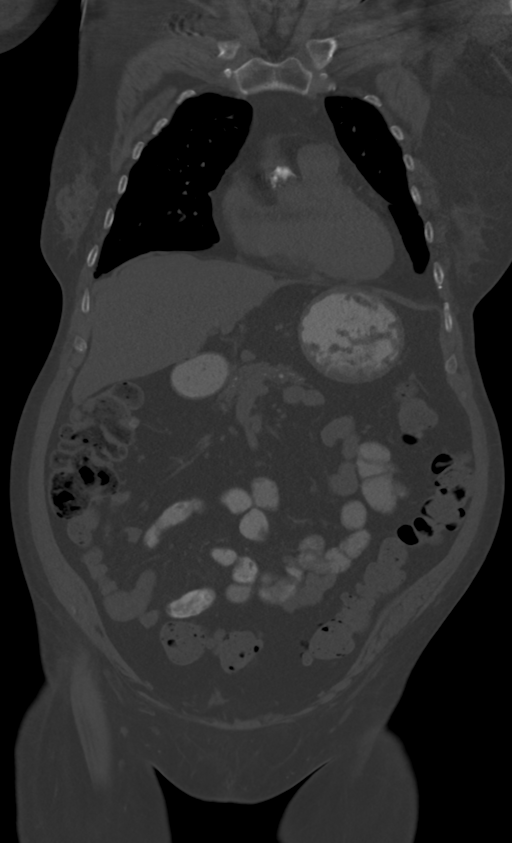
[im 51/91  soft-tissue]
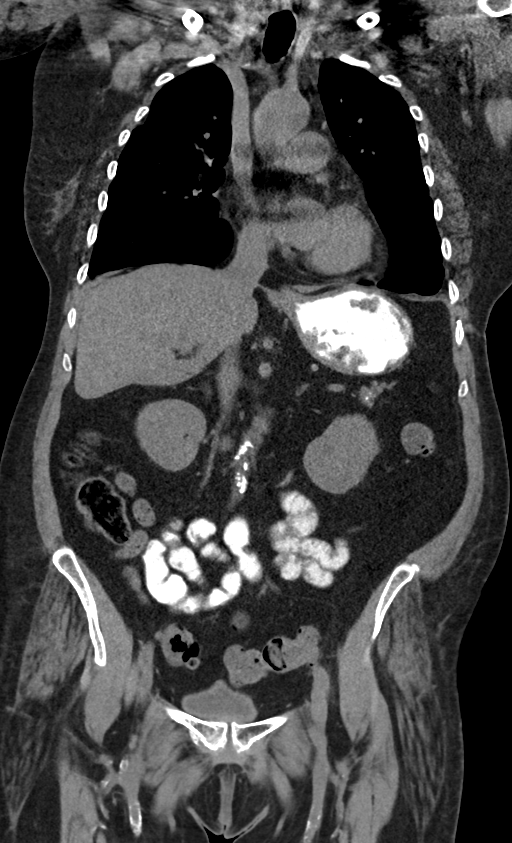

[Series 5: cap w/o 3.0 mm st sag · sagittal · non-contrast · 0.68mm/px · 1 of 111 slices shown, 2 images]
[im 37/111  soft-tissue]
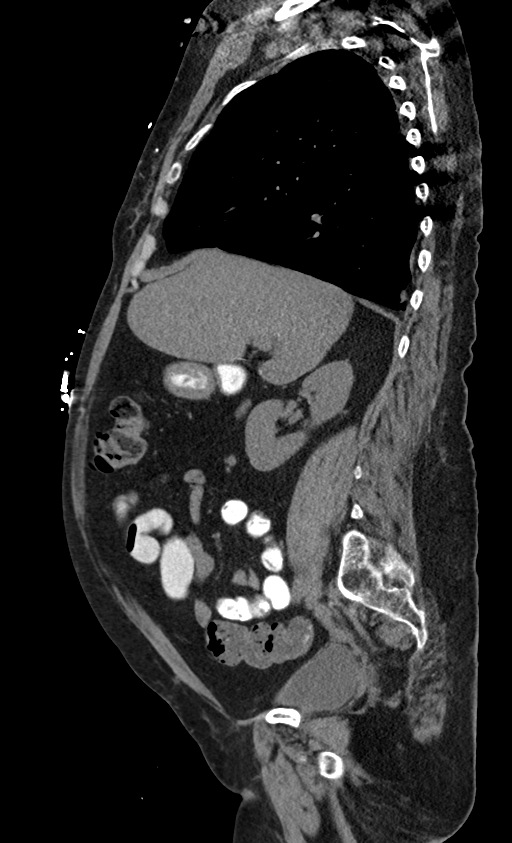
[im 37/111  bone]
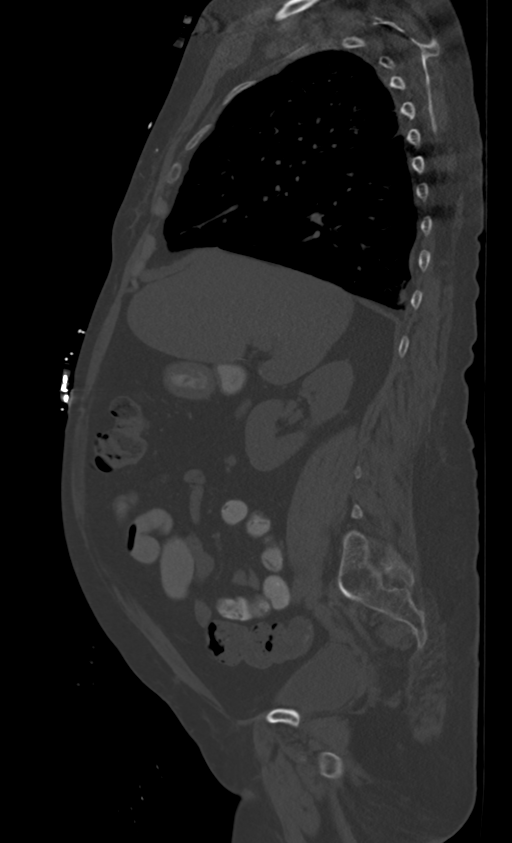

[Series 6: chest 1.0 mm st · axial · 0.89mm/px · z∈[-538,-331]mm · 4 of 720 slices shown]
[im 58/720  soft-tissue]
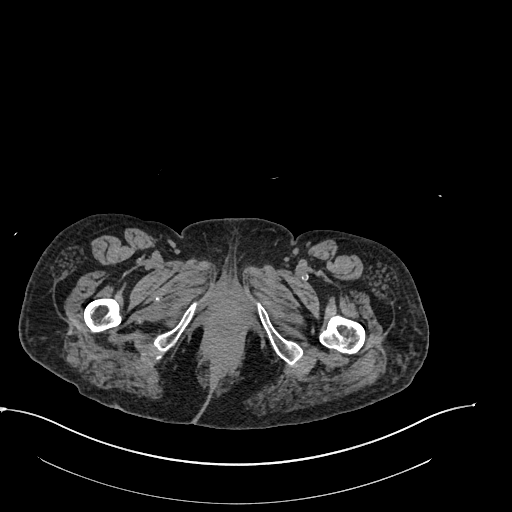
[im 144/720  soft-tissue]
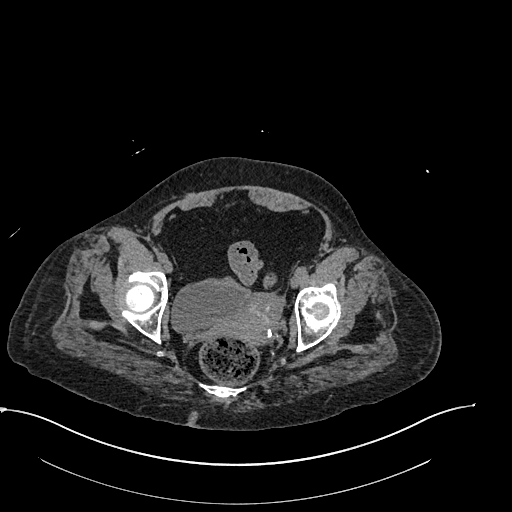
[im 231/720  soft-tissue]
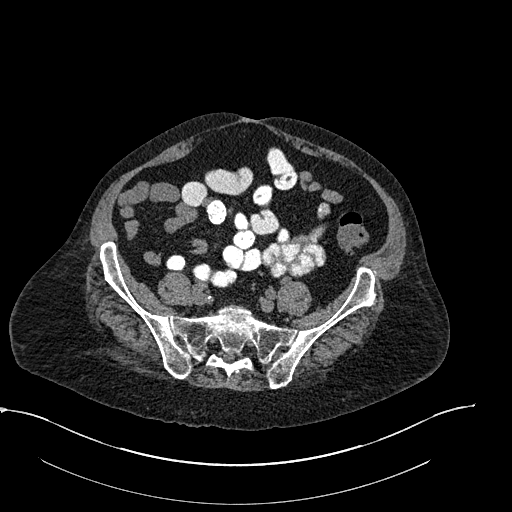
[im 317/720  soft-tissue]
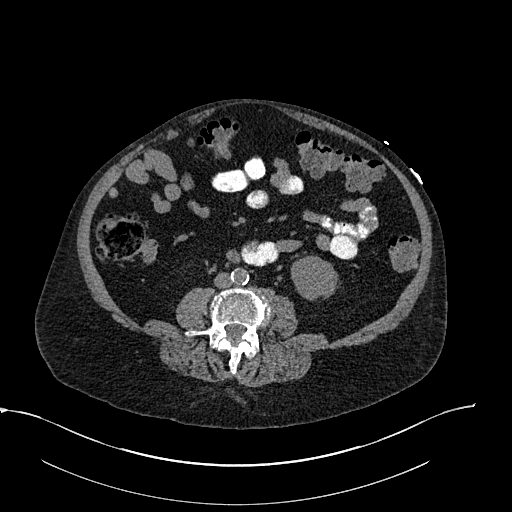

[10 of 46 positions shown; findings below may reference images not displayed]

FINDINGS: CT CHEST

THORACIC INLET/BODY WALL:

No acute abnormality.

MEDIASTINUM:

Normal heart size. No pericardial effusion. No acute vascular
abnormality.

Gastroesophageal reflux or poor clearance.

LUNG WINDOWS:

There is chronic subpleural reticulation at the bases, likely
posttraumatic scarring given the osseous findings. There is also
mild linear scarring in the right upper lobe. Subtle patchy
ground-glass density in the right lower lobe is favored to reflect
atelectasis given lung volumes appear lower. There is no definitive
edema, consolidation, effusion, or pneumothorax.

OSSEOUS:

See below

CT ABDOMEN AND PELVIS

Abdominal wall:  No contributory findings.

Hepatobiliary: No focal liver abnormality.Cholecystectomy with
normal common bile duct.

Pancreas: Atrophy with scattered coarse calcifications, some
potentially ductal. No active inflammation or visible duct
enlargement.

Spleen: Unremarkable.

Adrenals/Urinary Tract: Negative adrenals. No hydronephrosis or
stone. Unremarkable bladder.

Reproductive:No pathologic findings.

Stomach/Bowel: No obstruction. No appendicitis. Colonic
diverticulosis.

Vascular/Lymphatic: No acute vascular abnormality. No mass or
adenopathy.

Peritoneal: No ascites or pneumoperitoneum.

Musculoskeletal: T4 and T5 corpectomy with solid bony fusion.
Extensive bridging posterior fixation. Remote T11 inferior endplate
fracture. Lumbar disc narrowing and bulging, greatest at L4-5 where
there is also facet arthropathy. No acute osseous finding.
IMPRESSION: 1. No acute finding in the chest or abdomen.
2. Chronic mild lung scarring.
3. Chronic pancreatitis.

## 2015-12-29 LAB — CULTURE, BLOOD (ROUTINE X 2)
CULTURE: NO GROWTH
CULTURE: NO GROWTH

## 2016-01-02 ENCOUNTER — Encounter (HOSPITAL_COMMUNITY): Payer: Self-pay | Admitting: Emergency Medicine

## 2016-01-02 ENCOUNTER — Emergency Department (HOSPITAL_COMMUNITY): Payer: Medicaid Other

## 2016-01-02 ENCOUNTER — Emergency Department (HOSPITAL_COMMUNITY)
Admission: EM | Admit: 2016-01-02 | Discharge: 2016-01-02 | Disposition: A | Discharge status: Home / Self Care | Payer: Medicaid Other | Attending: Emergency Medicine | Admitting: Emergency Medicine

## 2016-01-02 DIAGNOSIS — M1990 Unspecified osteoarthritis, unspecified site: Secondary | ICD-10-CM | POA: Diagnosis not present

## 2016-01-02 DIAGNOSIS — Z7982 Long term (current) use of aspirin: Secondary | ICD-10-CM | POA: Insufficient documentation

## 2016-01-02 DIAGNOSIS — K219 Gastro-esophageal reflux disease without esophagitis: Secondary | ICD-10-CM | POA: Insufficient documentation

## 2016-01-02 DIAGNOSIS — I10 Essential (primary) hypertension: Secondary | ICD-10-CM | POA: Diagnosis not present

## 2016-01-02 DIAGNOSIS — Z79899 Other long term (current) drug therapy: Secondary | ICD-10-CM | POA: Diagnosis not present

## 2016-01-02 DIAGNOSIS — Z7952 Long term (current) use of systemic steroids: Secondary | ICD-10-CM | POA: Insufficient documentation

## 2016-01-02 DIAGNOSIS — R0602 Shortness of breath: Secondary | ICD-10-CM | POA: Diagnosis present

## 2016-01-02 DIAGNOSIS — G8929 Other chronic pain: Secondary | ICD-10-CM | POA: Diagnosis not present

## 2016-01-02 DIAGNOSIS — M79675 Pain in left toe(s): Secondary | ICD-10-CM | POA: Diagnosis not present

## 2016-01-02 DIAGNOSIS — Z7984 Long term (current) use of oral hypoglycemic drugs: Secondary | ICD-10-CM | POA: Insufficient documentation

## 2016-01-02 DIAGNOSIS — K5900 Constipation, unspecified: Secondary | ICD-10-CM | POA: Insufficient documentation

## 2016-01-02 DIAGNOSIS — J069 Acute upper respiratory infection, unspecified: Secondary | ICD-10-CM | POA: Diagnosis not present

## 2016-01-02 DIAGNOSIS — Z7951 Long term (current) use of inhaled steroids: Secondary | ICD-10-CM | POA: Insufficient documentation

## 2016-01-02 DIAGNOSIS — E119 Type 2 diabetes mellitus without complications: Secondary | ICD-10-CM | POA: Diagnosis not present

## 2016-01-02 DIAGNOSIS — Z8701 Personal history of pneumonia (recurrent): Secondary | ICD-10-CM | POA: Diagnosis not present

## 2016-01-02 DIAGNOSIS — Z87891 Personal history of nicotine dependence: Secondary | ICD-10-CM | POA: Insufficient documentation

## 2016-01-02 DIAGNOSIS — J45909 Unspecified asthma, uncomplicated: Secondary | ICD-10-CM

## 2016-01-02 LAB — CBC
HEMATOCRIT: 30.5 % — AB (ref 36.0–46.0)
HEMOGLOBIN: 9.5 g/dL — AB (ref 12.0–15.0)
MCH: 24.2 pg — ABNORMAL LOW (ref 26.0–34.0)
MCHC: 31.1 g/dL (ref 30.0–36.0)
MCV: 77.8 fL — ABNORMAL LOW (ref 78.0–100.0)
Platelets: 265 10*3/uL (ref 150–400)
RBC: 3.92 MIL/uL (ref 3.87–5.11)
RDW: 17.6 % — ABNORMAL HIGH (ref 11.5–15.5)
WBC: 7.4 10*3/uL (ref 4.0–10.5)

## 2016-01-02 LAB — BASIC METABOLIC PANEL
Anion gap: 14 (ref 5–15)
BUN: 14 mg/dL (ref 6–20)
CHLORIDE: 106 mmol/L (ref 101–111)
CO2: 23 mmol/L (ref 22–32)
CREATININE: 1.22 mg/dL — AB (ref 0.44–1.00)
Calcium: 9.1 mg/dL (ref 8.9–10.3)
GFR calc non Af Amer: 48 mL/min — ABNORMAL LOW (ref >60)
GFR, EST AFRICAN AMERICAN: 56 mL/min — AB (ref >60)
Glucose, Bld: 230 mg/dL — ABNORMAL HIGH (ref 65–99)
Potassium: 4.6 mmol/L (ref 3.5–5.1)
Sodium: 143 mmol/L (ref 135–145)

## 2016-01-02 LAB — I-STAT TROPONIN, ED: Troponin i, poc: 0 ng/mL (ref 0.00–0.08)

## 2016-01-02 LAB — LIPASE, BLOOD: Lipase: 19 U/L (ref 11–51)

## 2016-01-02 MED ORDER — PREDNISONE 20 MG PO TABS
40.0000 mg | ORAL_TABLET | Freq: Once | ORAL | Status: AC
  Administered 2016-01-02: 40 mg via ORAL
  Filled 2016-01-02: qty 2

## 2016-01-02 MED ORDER — HYDROCODONE-ACETAMINOPHEN 5-325 MG PO TABS: 1.0000 | ORAL_TABLET | Freq: Four times a day (QID) | ORAL | Status: DC | PRN

## 2016-01-02 MED ORDER — DM-GUAIFENESIN ER 30-600 MG PO TB12: 1.0000 | ORAL_TABLET | Freq: Two times a day (BID) | ORAL | Status: DC

## 2016-01-02 MED ORDER — HYDROCODONE-ACETAMINOPHEN 5-325 MG PO TABS
1.0000 | ORAL_TABLET | Freq: Once | ORAL | Status: AC
  Administered 2016-01-02: 1 via ORAL
  Filled 2016-01-02: qty 1

## 2016-01-02 MED ORDER — PREDNISONE 10 MG PO TABS: 40.0000 mg | ORAL_TABLET | Freq: Every day | ORAL | Status: DC

## 2016-01-02 NOTE — Discharge Instructions (Signed)
Use your albuterol inhaler or nebulizer on a regular basis. Increase her prednisone to 40 mg a day for the next 5 days. Take the Mucinex DM as directed. Take the hydrocodone as needed for your big toe fracture.  Make appointment to follow-up with your record Dr.

## 2016-01-02 NOTE — ED Notes (Signed)
Pt brought to room E43 by radiologist.

## 2016-01-02 NOTE — ED Notes (Signed)
Pt c/o pain all over, stomach, head, chest, hands, L big toe. Pt is talking in clear sentences. Pt c/o CP and SOB.

## 2016-01-02 NOTE — ED Provider Notes (Signed)
CSN: [id number]     Arrival date & time 01/02/16  1437 History   First MD Initiated Contact with Patient 01/02/16 1635     Chief Complaint  Patient presents with  . Chest Pain  . Shortness of Breath     (Consider location/radiation/quality/duration/timing/severity/associated sxs/prior Treatment) Patient is a 60 y.o. female presenting with chest pain and shortness of breath. The history is provided by the patient.  Chest Pain Associated symptoms: cough and shortness of breath   Associated symptoms: no abdominal pain, no fever, no headache, no nausea and not vomiting   Shortness of Breath Associated symptoms: chest pain, cough and wheezing   Associated symptoms: no abdominal pain, no fever, no headaches, no rash and no vomiting    patient with known history of asthma known history of diabetes. Patient with an upper respiratory infection for the past 3 days nonproductive but feels like she has a lot of phlegm and has been coughing frequently. Also feels like she's been wheezing. Associated with shortness of breath chest pain and some body aches. No fevers. Patient currently using albuterol for her asthma also on currently prednisone 15 mg once a day. Patient with a recent fracture to her left great toe is being followed by orthopedics. Patient is experiencing pain they're currently out of pain medicine. Patient with no new injury to the toe. Patient's chest pain is throughout both sides of the chest more so with cough.  Past Medical History  Diagnosis Date  . Arthritis   . Asthma   . Hypertension   . Constipation   . GERD (gastroesophageal reflux disease)   . Diabetes mellitus without complication (HCC)   . Chronic pain   . Seizures (HCC)     last seizure March 2015  . Pneumonia 10/2015   Past Surgical History  Procedure Laterality Date  . Back surgery    . Cholecystectomy    . Appendectomy     Family History  Problem Relation Age of Onset  . Kidney failure Mother   .  Hypertension Sister   . Hypertension Brother    Social History  Substance Use Topics  . Smoking status: Former Games developer  . Smokeless tobacco: Never Used  . Alcohol Use: No     Comment: admits to 2 drinks/week   OB History    No data available     Review of Systems  Constitutional: Negative for fever.  HENT: Positive for congestion.   Eyes: Negative for redness and visual disturbance.  Respiratory: Positive for cough, shortness of breath and wheezing.   Cardiovascular: Positive for chest pain.  Gastrointestinal: Negative for nausea, vomiting and abdominal pain.  Genitourinary: Negative for dysuria.  Musculoskeletal: Positive for myalgias.  Skin: Negative for rash.  Neurological: Negative for headaches.  Hematological: Does not bruise/bleed easily.  Psychiatric/Behavioral: Negative for confusion.      Allergies  Ivp dye  Home Medications   Prior to Admission medications   Medication Sig Start Date End Date Taking? Authorizing Provider  albuterol (PROVENTIL HFA;VENTOLIN HFA) 108 (90 BASE) MCG/ACT inhaler Inhale 2 puffs into the lungs every 6 (six) hours as needed for wheezing or shortness of breath.   Yes Historical Provider, MD  amLODipine (NORVASC) 5 MG tablet Take 5 mg by mouth daily.   Yes Historical Provider, MD  aspirin EC 81 MG tablet Take 1 tablet (81 mg total) by mouth daily. 06/14/15  Yes Richarda Overlie, MD  atorvastatin (LIPITOR) 10 MG tablet Take 10 mg by mouth daily. 08/13/15  Yes Historical Provider, MD  cholecalciferol (VITAMIN D) 1000 units tablet Take 2,000 Units by mouth daily.   Yes Historical Provider, MD  diazepam (VALIUM) 5 MG tablet Take 1 tablet (5 mg total) by mouth every 12 (twelve) hours as needed for muscle spasms. 08/25/15  Yes Rodolph Bong, MD  feeding supplement, ENSURE ENLIVE, (ENSURE ENLIVE) LIQD Take 237 mLs by mouth 2 (two) times daily between meals. 11/10/15  Yes Catarina Hartshorn, MD  Fluticasone-Salmeterol (ADVAIR) 250-50 MCG/DOSE AEPB Inhale 1  puff into the lungs 2 (two) times daily.   Yes Historical Provider, MD  folic acid (FOLVITE) 1 MG tablet Take 1 mg by mouth daily.   Yes Historical Provider, MD  furosemide (LASIX) 40 MG tablet Take 1 tablet (40 mg total) by mouth daily. 06/14/15  Yes Richarda Overlie, MD  gabapentin (NEURONTIN) 300 MG capsule Take 1 capsule (300 mg total) by mouth 2 (two) times daily. 06/14/15  Yes Richarda Overlie, MD  LINZESS 145 MCG CAPS capsule Take 145 mcg by mouth daily as needed.  08/13/15  Yes Historical Provider, MD  metFORMIN (GLUCOPHAGE-XR) 500 MG 24 hr tablet Take 500 mg by mouth daily with breakfast.   Yes Historical Provider, MD  metoprolol succinate (TOPROL-XL) 25 MG 24 hr tablet Take 25 mg by mouth daily.   Yes Historical Provider, MD  pantoprazole (PROTONIX) 40 MG tablet Take 1 tablet (40 mg total) by mouth 2 (two) times daily before a meal. 12/25/15  Yes Ripudeep K Rai, MD  polyethylene glycol (MIRALAX / GLYCOLAX) packet Take 17 g by mouth 2 (two) times daily. 06/14/15  Yes Richarda Overlie, MD  potassium chloride SA (K-DUR,KLOR-CON) 20 MEQ tablet Take 2 tablets (40 mEq total) by mouth daily. 01/27/15  Yes Calvert Cantor, MD  predniSONE (DELTASONE) 5 MG tablet Take 15 mg by mouth daily. 12/14/15  Yes Historical Provider, MD  promethazine (PHENERGAN) 25 MG tablet Take 1 tablet by mouth every 6 (six) hours as needed for nausea or vomiting. nausea 10/06/15  Yes Historical Provider, MD  tiZANidine (ZANAFLEX) 4 MG tablet Take 4 mg by mouth 4 (four) times daily. 08/13/15  Yes Historical Provider, MD  traMADol (ULTRAM) 50 MG tablet Take 1 tablet (50 mg total) by mouth every 8 (eight) hours as needed for moderate pain. For arthritis pain 12/25/15 12/24/16 Yes Ripudeep Jenna Luo, MD  Vitamin D, Ergocalciferol, (DRISDOL) 50000 UNITS CAPS capsule Take 1 capsule (50,000 Units total) by mouth every 7 (seven) days. Sunday Patient taking differently: Take 50,000 Units by mouth every 7 (seven) days.  01/27/15  Yes Calvert Cantor, MD  VITAMIN E PO  Take 1 capsule by mouth daily.   Yes Historical Provider, MD  dextromethorphan-guaiFENesin (MUCINEX DM) 30-600 MG 12hr tablet Take 1 tablet by mouth 2 (two) times daily. 01/02/16   Vanetta Mulders, MD  HYDROcodone-acetaminophen (NORCO/VICODIN) 5-325 MG tablet Take 1-2 tablets by mouth every 6 (six) hours as needed for moderate pain. 01/02/16   Vanetta Mulders, MD  Oxycodone HCl 20 MG TABS Take 1 tablet (20 mg total) by mouth every 4 (four) hours. Refill by primary MD Patient not taking: Reported on 01/02/2016 12/25/15   Ripudeep Jenna Luo, MD  oxymorphone (OPANA ER) 40 MG 12 hr tablet Take 1 tablet (40 mg total) by mouth every 12 (twelve) hours. Refill by primary MD Patient not taking: Reported on 01/02/2016 12/25/15   Ripudeep Jenna Luo, MD  predniSONE (DELTASONE) 10 MG tablet Take 4 tablets (40 mg total) by mouth daily. 01/02/16  Vanetta Mulders, MD   BP 149/92 mmHg  Pulse 93  Temp(Src) 98.5 F (36.9 C)  Resp 20  SpO2 100% Physical Exam  Constitutional: She is oriented to person, place, and time. She appears well-developed and well-nourished. No distress.  HENT:  Head: Normocephalic and atraumatic.  Mouth/Throat: Oropharynx is clear and moist.  Eyes: Conjunctivae and EOM are normal. Pupils are equal, round, and reactive to light.  Neck: Normal range of motion. Neck supple.  Cardiovascular: Normal rate, regular rhythm and normal heart sounds.   No murmur heard. Pulmonary/Chest: Effort normal and breath sounds normal. No respiratory distress.  Abdominal: Soft. Bowel sounds are normal. There is no tenderness.  Musculoskeletal: Normal range of motion.  Left foot in postop shoe.  Neurological: She is alert and oriented to person, place, and time. No cranial nerve deficit. She exhibits normal muscle tone. Coordination normal.  Skin: Skin is warm.  Nursing note and vitals reviewed.   ED Course  Procedures (including critical care time) Labs Review Labs Reviewed  BASIC METABOLIC PANEL - Abnormal;  Notable for the following:    Glucose, Bld 230 (*)    Creatinine, Ser 1.22 (*)    GFR calc non Af Amer 48 (*)    GFR calc Af Amer 56 (*)    All other components within normal limits  CBC - Abnormal; Notable for the following:    Hemoglobin 9.5 (*)    HCT 30.5 (*)    MCV 77.8 (*)    MCH 24.2 (*)    RDW 17.6 (*)    All other components within normal limits  LIPASE, BLOOD  I-STAT TROPOININ, ED   Results for orders placed or performed during the hospital encounter of 01/02/16  Basic metabolic panel  Result Value Ref Range   Sodium 143 135 - 145 mmol/L   Potassium 4.6 3.5 - 5.1 mmol/L   Chloride 106 101 - 111 mmol/L   CO2 23 22 - 32 mmol/L   Glucose, Bld 230 (H) 65 - 99 mg/dL   BUN 14 6 - 20 mg/dL   Creatinine, Ser 3.57 (H) 0.44 - 1.00 mg/dL   Calcium 9.1 8.9 - 89.7 mg/dL   GFR calc non Af Amer 48 (L) >60 mL/min   GFR calc Af Amer 56 (L) >60 mL/min   Anion gap 14 5 - 15  CBC  Result Value Ref Range   WBC 7.4 4.0 - 10.5 K/uL   RBC 3.92 3.87 - 5.11 MIL/uL   Hemoglobin 9.5 (L) 12.0 - 15.0 g/dL   HCT 84.7 (L) 84.1 - 28.2 %   MCV 77.8 (L) 78.0 - 100.0 fL   MCH 24.2 (L) 26.0 - 34.0 pg   MCHC 31.1 30.0 - 36.0 g/dL   RDW 08.1 (H) 38.8 - 71.9 %   Platelets 265 150 - 400 K/uL  Lipase, blood  Result Value Ref Range   Lipase 19 11 - 51 U/L  I-stat troponin, ED (not at [facility])  Result Value Ref Range   Troponin i, poc 0.00 0.00 - 0.08 ng/mL   Comment 3             Imaging Review Dg Chest 2 View  01/02/2016  CLINICAL DATA:  Shortness of breath, cough, chest pain EXAM: CHEST  2 VIEW COMPARISON:  12/23/2015 FINDINGS: Chronic blunting of the left costophrenic angle, possibly reflecting a small left pleural effusion. No focal consolidation. No pneumothorax. Cardiomegaly. Thoracic spinal fixation. IMPRESSION: Possible small left pleural effusion, unchanged. Electronically Signed  By: Charline Bills M.D.   On: 01/02/2016 15:40   I have personally reviewed and evaluated these  images and lab results as part of my medical decision-making.   EKG Interpretation   Date/Time:  Sunday January 02 2016 15:02:58 EST Ventricular Rate:  93 PR Interval:  102 QRS Duration: 56 QT Interval:  350 QTC Calculation: 435 R Axis:   53 Text Interpretation:  Undetermined rhythm Nonspecific ST and T wave  abnormality Abnormal ECG Interpretation limited secondary to artifact  Confirmed by Ayasha Ellingsen  MD, Jammie Troup [phone]) on 01/02/2016 4:52:45 PM      MDM   Final diagnoses:  URI (upper respiratory infection)  Asthma, unspecified asthma severity, uncomplicated  Great toe pain, left   Patient with a complaint of upper respiratory infection for about 3 days. With a nonproductive cough increased shortness of breath and feeling like she's wheezing. No wheezing detected here. Patient does have albuterol at home also is on 15 mg of prednisone a day already. Workup for that chest discomfort which is probably related to the upper respiratory infection without any acute cardiac findings. Patient also with complaint of pain to the left great toe. That is known to be fractured. In his being followed by orthopedics.    patient has a walking shoe.  Chest x-rays negative for pneumonia. Will increase patient's prednisone to 40 mg a day for 5 days. Well help treat her foot pain. Patient also be treated with Mucinex DM for the cough and phlegm. Patient nontoxic stable for discharge home.  Patient is a known diabetic. Blood sugars are elevated but no evidence of metabolic acidosis.   Vanetta Mulders, MD 01/02/16 1744

## 2016-01-30 ENCOUNTER — Emergency Department (HOSPITAL_BASED_OUTPATIENT_CLINIC_OR_DEPARTMENT_OTHER): Payer: Medicaid Other

## 2016-01-30 ENCOUNTER — Encounter (HOSPITAL_BASED_OUTPATIENT_CLINIC_OR_DEPARTMENT_OTHER): Payer: Self-pay | Admitting: Emergency Medicine

## 2016-01-30 ENCOUNTER — Emergency Department (HOSPITAL_BASED_OUTPATIENT_CLINIC_OR_DEPARTMENT_OTHER)
Admission: EM | Admit: 2016-01-30 | Discharge: 2016-01-31 | Disposition: A | Discharge status: Home / Self Care | Payer: Medicaid Other | Attending: Emergency Medicine | Admitting: Emergency Medicine

## 2016-01-30 DIAGNOSIS — Z7951 Long term (current) use of inhaled steroids: Secondary | ICD-10-CM | POA: Diagnosis not present

## 2016-01-30 DIAGNOSIS — B3731 Acute candidiasis of vulva and vagina: Secondary | ICD-10-CM

## 2016-01-30 DIAGNOSIS — M1990 Unspecified osteoarthritis, unspecified site: Secondary | ICD-10-CM | POA: Insufficient documentation

## 2016-01-30 DIAGNOSIS — M545 Low back pain, unspecified: Secondary | ICD-10-CM

## 2016-01-30 DIAGNOSIS — M069 Rheumatoid arthritis, unspecified: Secondary | ICD-10-CM | POA: Diagnosis not present

## 2016-01-30 DIAGNOSIS — I10 Essential (primary) hypertension: Secondary | ICD-10-CM | POA: Insufficient documentation

## 2016-01-30 DIAGNOSIS — S3992XA Unspecified injury of lower back, initial encounter: Secondary | ICD-10-CM | POA: Diagnosis present

## 2016-01-30 DIAGNOSIS — K5900 Constipation, unspecified: Secondary | ICD-10-CM | POA: Insufficient documentation

## 2016-01-30 DIAGNOSIS — Y998 Other external cause status: Secondary | ICD-10-CM | POA: Diagnosis not present

## 2016-01-30 DIAGNOSIS — Z7982 Long term (current) use of aspirin: Secondary | ICD-10-CM | POA: Diagnosis not present

## 2016-01-30 DIAGNOSIS — K219 Gastro-esophageal reflux disease without esophagitis: Secondary | ICD-10-CM | POA: Diagnosis not present

## 2016-01-30 DIAGNOSIS — E119 Type 2 diabetes mellitus without complications: Secondary | ICD-10-CM | POA: Diagnosis not present

## 2016-01-30 DIAGNOSIS — Z79899 Other long term (current) drug therapy: Secondary | ICD-10-CM | POA: Diagnosis not present

## 2016-01-30 DIAGNOSIS — Z7952 Long term (current) use of systemic steroids: Secondary | ICD-10-CM | POA: Insufficient documentation

## 2016-01-30 DIAGNOSIS — B373 Candidiasis of vulva and vagina: Secondary | ICD-10-CM | POA: Insufficient documentation

## 2016-01-30 DIAGNOSIS — G8929 Other chronic pain: Secondary | ICD-10-CM | POA: Diagnosis not present

## 2016-01-30 DIAGNOSIS — Y9289 Other specified places as the place of occurrence of the external cause: Secondary | ICD-10-CM | POA: Diagnosis not present

## 2016-01-30 DIAGNOSIS — J45909 Unspecified asthma, uncomplicated: Secondary | ICD-10-CM | POA: Diagnosis not present

## 2016-01-30 DIAGNOSIS — Z8701 Personal history of pneumonia (recurrent): Secondary | ICD-10-CM | POA: Insufficient documentation

## 2016-01-30 DIAGNOSIS — W19XXXA Unspecified fall, initial encounter: Secondary | ICD-10-CM

## 2016-01-30 DIAGNOSIS — Y9389 Activity, other specified: Secondary | ICD-10-CM | POA: Insufficient documentation

## 2016-01-30 NOTE — ED Notes (Addendum)
Pt into room by w/c. 2 staff assist to stretcher. EDPA into room. Pt seen by EDPA prior to RN assessment, see PA notes, orders received and initiated. Pt alert, NAD, calm, interactive, resps e/u, no dyspnea noted, skin W&D.

## 2016-01-30 NOTE — ED Notes (Signed)
Pt to xray via stretcher

## 2016-01-30 NOTE — ED Notes (Signed)
Per Ems the patient fell out of her wheelchair and slid down the wall. The patient reports that she has lower back pain  - patient has a history of chronic lower back pani

## 2016-01-31 LAB — URINE MICROSCOPIC-ADD ON

## 2016-01-31 LAB — URINALYSIS, ROUTINE W REFLEX MICROSCOPIC
Bilirubin Urine: NEGATIVE
GLUCOSE, UA: NEGATIVE mg/dL
KETONES UR: NEGATIVE mg/dL
Nitrite: NEGATIVE
PROTEIN: NEGATIVE mg/dL
Specific Gravity, Urine: 1.004 — ABNORMAL LOW (ref 1.005–1.030)
pH: 7 (ref 5.0–8.0)

## 2016-01-31 MED ORDER — FLUCONAZOLE 50 MG PO TABS
150.0000 mg | ORAL_TABLET | Freq: Once | ORAL | Status: AC
  Administered 2016-01-31: 150 mg via ORAL
  Filled 2016-01-31: qty 1

## 2016-01-31 MED ORDER — CYCLOBENZAPRINE HCL 10 MG PO TABS: 10.0000 mg | ORAL_TABLET | Freq: Two times a day (BID) | ORAL | Status: DC | PRN

## 2016-01-31 MED ORDER — NAPROXEN SODIUM 275 MG PO TABS: 275.0000 mg | ORAL_TABLET | Freq: Two times a day (BID) | ORAL | Status: DC

## 2016-01-31 MED ORDER — HYDROMORPHONE HCL 2 MG PO TABS
2.0000 mg | ORAL_TABLET | Freq: Once | ORAL | Status: DC
  Filled 2016-01-31: qty 1

## 2016-01-31 MED ORDER — OXYCODONE-ACETAMINOPHEN 5-325 MG PO TABS
ORAL_TABLET | ORAL | Status: AC
  Filled 2016-01-31: qty 1

## 2016-01-31 MED ORDER — OXYCODONE-ACETAMINOPHEN 5-325 MG PO TABS
ORAL_TABLET | ORAL | Status: AC
  Administered 2016-01-31: 2 via ORAL
  Filled 2016-01-31: qty 2

## 2016-01-31 MED ORDER — OXYCODONE-ACETAMINOPHEN 5-325 MG PO TABS
2.0000 | ORAL_TABLET | Freq: Once | ORAL | Status: AC
  Administered 2016-01-31: 2 via ORAL

## 2016-01-31 NOTE — Discharge Instructions (Signed)
Please follow up with your pain management doctor for better pain control. Your xrays showed no new injuries.  Back Pain, Adult Back pain is very common in adults.The cause of back pain is rarely dangerous and the pain often gets better over time.The cause of your back pain may not be known. Some common causes of back pain include:  Strain of the muscles or ligaments supporting the spine.  Wear and tear (degeneration) of the spinal disks.  Arthritis.  Direct injury to the back. For many people, back pain may return. Since back pain is rarely dangerous, most people can learn to manage this condition on their own. HOME CARE INSTRUCTIONS Watch your back pain for any changes. The following actions may help to lessen any discomfort you are feeling:  Remain active. It is stressful on your back to sit or stand in one place for long periods of time. Do not sit, drive, or stand in one place for more than 30 minutes at a time. Take short walks on even surfaces as soon as you are able.Try to increase the length of time you walk each day.  Exercise regularly as directed by your health care provider. Exercise helps your back heal faster. It also helps avoid future injury by keeping your muscles strong and flexible.  Do not stay in bed.Resting more than 1-2 days can delay your recovery.  Pay attention to your body when you bend and lift. The most comfortable positions are those that put less stress on your recovering back. Always use proper lifting techniques, including:  Bending your knees.  Keeping the load close to your body.  Avoiding twisting.  Find a comfortable position to sleep. Use a firm mattress and lie on your side with your knees slightly bent. If you lie on your back, put a pillow under your knees.  Avoid feeling anxious or stressed.Stress increases muscle tension and can worsen back pain.It is important to recognize when you are anxious or stressed and learn ways to manage it,  such as with exercise.  Take medicines only as directed by your health care provider. Over-the-counter medicines to reduce pain and inflammation are often the most helpful.Your health care provider may prescribe muscle relaxant drugs.These medicines help dull your pain so you can more quickly return to your normal activities and healthy exercise.  Apply ice to the injured area:  Put ice in a plastic bag.  Place a towel between your skin and the bag.  Leave the ice on for 20 minutes, 2-3 times a day for the first 2-3 days. After that, ice and heat may be alternated to reduce pain and spasms.  Maintain a healthy weight. Excess weight puts extra stress on your back and makes it difficult to maintain good posture. SEEK MEDICAL CARE IF:  You have pain that is not relieved with rest or medicine.  You have increasing pain going down into the legs or buttocks.  You have pain that does not improve in one week.  You have night pain.  You lose weight.  You have a fever or chills. SEEK IMMEDIATE MEDICAL CARE IF:   You develop new bowel or bladder control problems.  You have unusual weakness or numbness in your arms or legs.  You develop nausea or vomiting.  You develop abdominal pain.  You feel faint.   This information is not intended to replace advice given to you by your health care provider. Make sure you discuss any questions you have with your health  care provider.   Document Released: 11/06/2005 Document Revised: 11/27/2014 Document Reviewed: 03/10/2014 Elsevier Interactive Patient Education 2016 [employer]  Monilial Vaginitis Vaginitis in a soreness, swelling and redness (inflammation) of the vagina and vulva. Monilial vaginitis is not a sexually transmitted infection. CAUSES  Yeast vaginitis is caused by yeast (candida) that is normally found in your vagina. With a yeast infection, the candida has overgrown in number to a point that upsets the chemical  balance. SYMPTOMS   White, thick vaginal discharge.  Swelling, itching, redness and irritation of the vagina and possibly the lips of the vagina (vulva).  Burning or painful urination.  Painful intercourse. DIAGNOSIS  Things that may contribute to monilial vaginitis are:  Postmenopausal and virginal states.  Pregnancy.  Infections.  Being tired, sick or stressed, especially if you had monilial vaginitis in the past.  Diabetes. Good control will help lower the chance.  Birth control pills.  Tight fitting garments.  Using bubble bath, feminine sprays, douches or deodorant tampons.  Taking certain medications that kill germs (antibiotics).  Sporadic recurrence can occur if you become ill. TREATMENT  Your caregiver will give you medication.  There are several kinds of anti monilial vaginal creams and suppositories specific for monilial vaginitis. For recurrent yeast infections, use a suppository or cream in the vagina 2 times a week, or as directed.  Anti-monilial or steroid cream for the itching or irritation of the vulva may also be used. Get your caregiver's permission.  Painting the vagina with methylene blue solution may help if the monilial cream does not work.  Eating yogurt may help prevent monilial vaginitis. HOME CARE INSTRUCTIONS   Finish all medication as prescribed.  Do not have sex until treatment is completed or after your caregiver tells you it is okay.  Take warm sitz baths.  Do not douche.  Do not use tampons, especially scented ones.  Wear cotton underwear.  Avoid tight pants and panty hose.  Tell your sexual partner that you have a yeast infection. They should go to their caregiver if they have symptoms such as mild rash or itching.  Your sexual partner should be treated as well if your infection is difficult to eliminate.  Practice safer sex. Use condoms.  Some vaginal medications cause latex condoms to fail. Vaginal medications that  harm condoms are:  Cleocin cream.  Butoconazole (Femstat).  Terconazole (Terazol) vaginal suppository.  Miconazole (Monistat) (may be purchased over the counter). SEEK MEDICAL CARE IF:   You have a temperature by mouth above 102 F (38.9 C).  The infection is getting worse after 2 days of treatment.  The infection is not getting better after 3 days of treatment.  You develop blisters in or around your vagina.  You develop vaginal bleeding, and it is not your menstrual period.  You have pain when you urinate.  You develop intestinal problems.  You have pain with sexual intercourse.   This information is not intended to replace advice given to you by your health care provider. Make sure you discuss any questions you have with your health care provider.   Document Released: 08/16/2005 Document Revised: 01/29/2012 Document Reviewed: 05/10/2015 Elsevier Interactive Patient Education [employer]

## 2016-01-31 NOTE — ED Provider Notes (Signed)
CSN: [id number]     Arrival date & time 01/30/16  2004 History   First MD Initiated Contact with Patient 01/30/16 2318     Chief Complaint  Patient presents with  . Fall     (Consider location/radiation/quality/duration/timing/severity/associated sxs/prior Treatment) HPI   This is a 60 year old female with a past medical history of rheumatoid arthritis, diabetes, and chronic pain who presents emergency Department with chief complaint of pain after fall. The patient has a past medical history of osteomyelitis of the thoracic spine in 2016 treated at [facility]. She currently takes prednisone for her rheumatoid arthritis and is followed by Dr. Quinn Plowman. Patient used to be on Enbrel, however, she states that she was discontinued on the DMARD therapy because her osteomyelitis was thought to be secondary to use of the Enbrel. Patient states that she was trying to stand up out of her wheelchair when the wheelchair slipped backward and she fell onto her sacrum. She complains of exacerbation of her severe chronic back pain. She is able to stand and walk but states that it is very painful. She denies any new weakness, saddle anesthesia. She has not been on any recent antibiotics. She denies fevers or chills. She denies urinary symptoms but does endorse vaginal discharge which is pruritic and itchy. Patient states that her sugars have been running higher on the prednisone. She thinks that she may have a yeast infection  Past Medical History  Diagnosis Date  . Arthritis   . Asthma   . Hypertension   . Constipation   . GERD (gastroesophageal reflux disease)   . Diabetes mellitus without complication (HCC)   . Chronic pain   . Seizures (HCC)     last seizure March 2015  . Pneumonia 10/2015   Past Surgical History  Procedure Laterality Date  . Back surgery    . Cholecystectomy    . Appendectomy     Family History  Problem Relation Age of Onset  . Kidney failure Mother   . Hypertension Sister   .  Hypertension Brother    Social History  Substance Use Topics  . Smoking status: Former Games developer  . Smokeless tobacco: Never Used  . Alcohol Use: No     Comment: admits to 2 drinks/week   OB History    No data available     Review of Systems Ten systems reviewed and are negative for acute change, except as noted in the HPI.     Allergies  Ivp dye  Home Medications   Prior to Admission medications   Medication Sig Start Date End Date Taking? Authorizing Provider  albuterol (PROVENTIL HFA;VENTOLIN HFA) 108 (90 BASE) MCG/ACT inhaler Inhale 2 puffs into the lungs every 6 (six) hours as needed for wheezing or shortness of breath.    Historical Provider, MD  amLODipine (NORVASC) 5 MG tablet Take 5 mg by mouth daily.    Historical Provider, MD  aspirin EC 81 MG tablet Take 1 tablet (81 mg total) by mouth daily. 06/14/15   Richarda Overlie, MD  atorvastatin (LIPITOR) 10 MG tablet Take 10 mg by mouth daily. 08/13/15   Historical Provider, MD  cholecalciferol (VITAMIN D) 1000 units tablet Take 2,000 Units by mouth daily.    Historical Provider, MD  dextromethorphan-guaiFENesin (MUCINEX DM) 30-600 MG 12hr tablet Take 1 tablet by mouth 2 (two) times daily. 01/02/16   Vanetta Mulders, MD  diazepam (VALIUM) 5 MG tablet Take 1 tablet (5 mg total) by mouth every 12 (twelve) hours as needed for  muscle spasms. 08/25/15   Rodolph Bong, MD  feeding supplement, ENSURE ENLIVE, (ENSURE ENLIVE) LIQD Take 237 mLs by mouth 2 (two) times daily between meals. 11/10/15   Catarina Hartshorn, MD  Fluticasone-Salmeterol (ADVAIR) 250-50 MCG/DOSE AEPB Inhale 1 puff into the lungs 2 (two) times daily.    Historical Provider, MD  folic acid (FOLVITE) 1 MG tablet Take 1 mg by mouth daily.    Historical Provider, MD  furosemide (LASIX) 40 MG tablet Take 1 tablet (40 mg total) by mouth daily. 06/14/15   Richarda Overlie, MD  gabapentin (NEURONTIN) 300 MG capsule Take 1 capsule (300 mg total) by mouth 2 (two) times daily. 06/14/15   Richarda Overlie, MD  HYDROcodone-acetaminophen (NORCO/VICODIN) 5-325 MG tablet Take 1-2 tablets by mouth every 6 (six) hours as needed for moderate pain. 01/02/16   Vanetta Mulders, MD  LINZESS 145 MCG CAPS capsule Take 145 mcg by mouth daily as needed.  08/13/15   Historical Provider, MD  metFORMIN (GLUCOPHAGE-XR) 500 MG 24 hr tablet Take 500 mg by mouth daily with breakfast.    Historical Provider, MD  metoprolol succinate (TOPROL-XL) 25 MG 24 hr tablet Take 25 mg by mouth daily.    Historical Provider, MD  Oxycodone HCl 20 MG TABS Take 1 tablet (20 mg total) by mouth every 4 (four) hours. Refill by primary MD Patient not taking: Reported on 01/02/2016 12/25/15   Ripudeep Jenna Luo, MD  oxymorphone (OPANA ER) 40 MG 12 hr tablet Take 1 tablet (40 mg total) by mouth every 12 (twelve) hours. Refill by primary MD Patient not taking: Reported on 01/02/2016 12/25/15   Ripudeep Jenna Luo, MD  pantoprazole (PROTONIX) 40 MG tablet Take 1 tablet (40 mg total) by mouth 2 (two) times daily before a meal. 12/25/15   Ripudeep K Rai, MD  polyethylene glycol (MIRALAX / GLYCOLAX) packet Take 17 g by mouth 2 (two) times daily. 06/14/15   Richarda Overlie, MD  potassium chloride SA (K-DUR,KLOR-CON) 20 MEQ tablet Take 2 tablets (40 mEq total) by mouth daily. 01/27/15   Calvert Cantor, MD  predniSONE (DELTASONE) 10 MG tablet Take 4 tablets (40 mg total) by mouth daily. 01/02/16   Vanetta Mulders, MD  predniSONE (DELTASONE) 5 MG tablet Take 15 mg by mouth daily. 12/14/15   Historical Provider, MD  promethazine (PHENERGAN) 25 MG tablet Take 1 tablet by mouth every 6 (six) hours as needed for nausea or vomiting. nausea 10/06/15   Historical Provider, MD  tiZANidine (ZANAFLEX) 4 MG tablet Take 4 mg by mouth 4 (four) times daily. 08/13/15   Historical Provider, MD  traMADol (ULTRAM) 50 MG tablet Take 1 tablet (50 mg total) by mouth every 8 (eight) hours as needed for moderate pain. For arthritis pain 12/25/15 12/24/16  Ripudeep Jenna Luo, MD  Vitamin D, Ergocalciferol,  (DRISDOL) 50000 UNITS CAPS capsule Take 1 capsule (50,000 Units total) by mouth every 7 (seven) days. Sunday Patient taking differently: Take 50,000 Units by mouth every 7 (seven) days.  01/27/15   Calvert Cantor, MD  VITAMIN E PO Take 1 capsule by mouth daily.    Historical Provider, MD   BP 147/93 mmHg  Pulse 104  Temp(Src) 98 F (36.7 C) (Oral)  Resp 16  Ht 5' (1.524 m)  Wt 66.225 kg  BMI 28.51 kg/m2  SpO2 100% Physical Exam  Constitutional: She is oriented to person, place, and time. She appears well-developed and well-nourished. No distress.  HENT:  Head: Normocephalic and atraumatic.  Eyes: Conjunctivae are  normal. No scleral icterus.  Neck: Normal range of motion.  Cardiovascular: Normal rate, regular rhythm and normal heart sounds.  Exam reveals no gallop and no friction rub.   No murmur heard. Pulmonary/Chest: Effort normal and breath sounds normal. No respiratory distress.  Abdominal: Soft. Bowel sounds are normal. She exhibits no distension and no mass. There is no tenderness. There is no guarding.  Musculoskeletal:  No midline spinal tenderness, Pain across sacrum and [place]. No bruising. Able to stand and ambulate with assistance. Normal DTRs.  Neurological: She is alert and oriented to person, place, and time.  Skin: Skin is warm and dry. She is not diaphoretic.    ED Course  Procedures (including critical care time) Labs Review Labs Reviewed  URINALYSIS, ROUTINE W REFLEX MICROSCOPIC (NOT AT [facility]) - Abnormal; Notable for the following:    APPearance CLOUDY (*)    Specific Gravity, Urine 1.004 (*)    Hgb urine dipstick SMALL (*)    Leukocytes, UA LARGE (*)    All other components within normal limits  URINE MICROSCOPIC-ADD ON - Abnormal; Notable for the following:    Squamous Epithelial / LPF 6-30 (*)    Bacteria, UA FEW (*)    All other components within normal limits  URINE CULTURE    Imaging Review No results found. I have personally reviewed and  evaluated these images and lab results as part of my medical decision-making.   EKG Interpretation None      MDM   Final diagnoses:  Fall   +yeast on urinalysis, Sent for culture. Will treat with PO diflucan. No other urinary complaints.   Patient with back pain.  No neurological deficits and normal neuro exam.  Patient can walk but states is painful.  No loss of bowel or bladder control.  No concern for cauda equina.  No fever, night sweats, weight loss, h/o cancer, IVDU.  RICE protocol and pain medicine indicated and discussed with patient.      Arthor Captain, PA-C 01/31/16 0121  Paula Libra, MD 02/02/16 [phone]

## 2016-02-01 LAB — URINE CULTURE

## 2016-02-06 ENCOUNTER — Telehealth: Payer: Self-pay | Admitting: *Deleted

## 2016-02-06 NOTE — Telephone Encounter (Signed)
Changed prescription from Anaprox 275 mg tablets Take 1 tablet by mouth 2 times daily with a meal # 20   To  Anaprox 500 mg tablets, Take 1 tablet daily with meal  #10. No additional CM needs.

## 2016-02-08 IMAGING — CR DG TOE GREAT 2+V*L*
3 series · 3 of 3 positions shown · non-contrast
Comparison: None.

CLINICAL DATA: Dropped frozen chicken on left great toe 4 days ago
with pain and swelling. Initial encounter.

EXAM:
LEFT GREAT TOE

[toe ap]
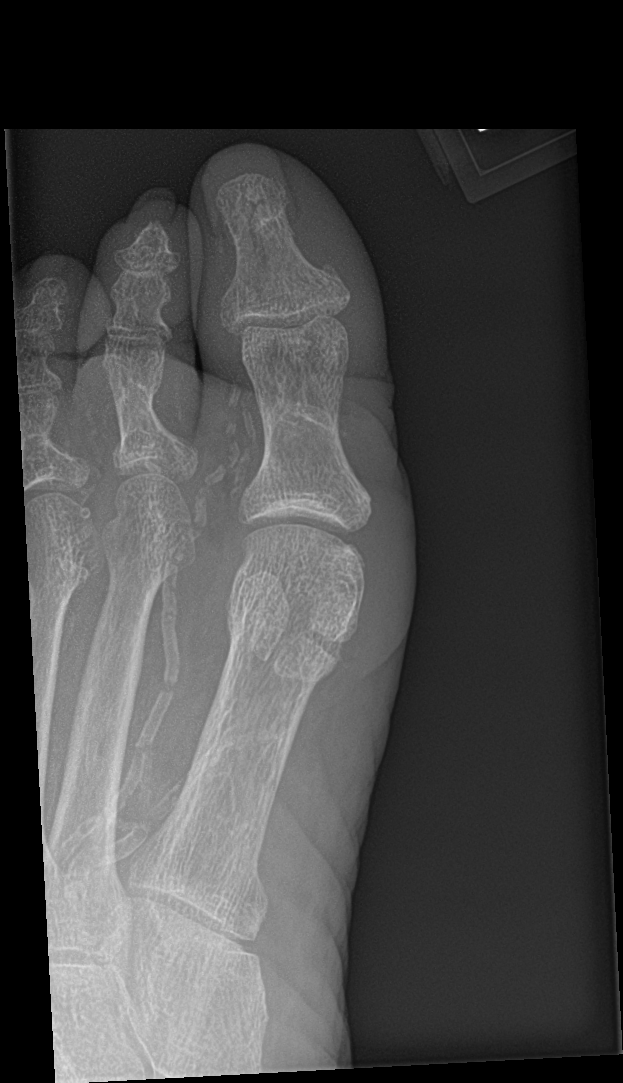

[toe obl]
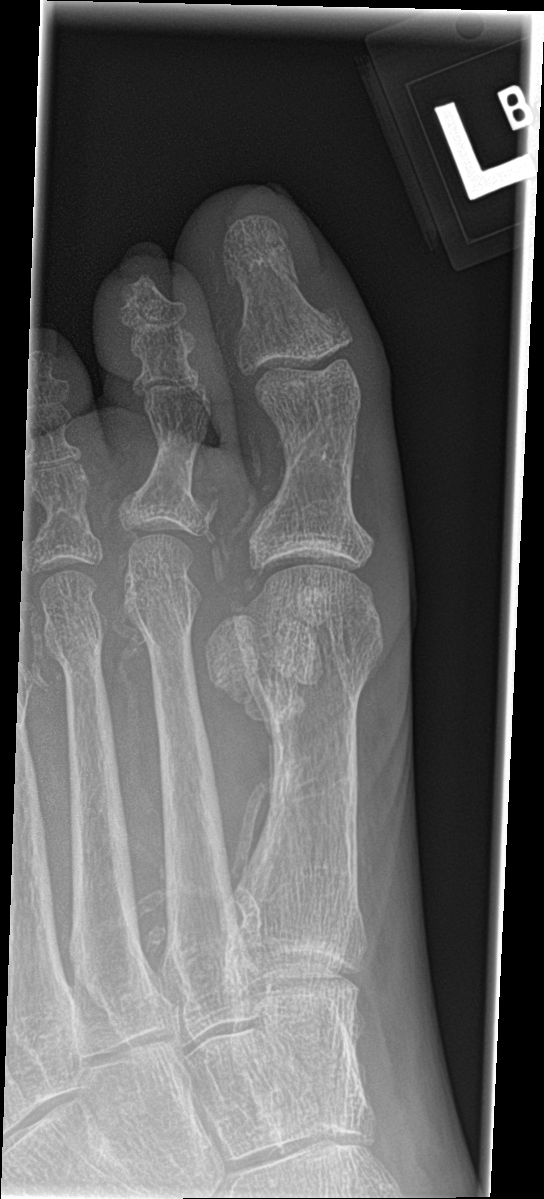

[toe lat]
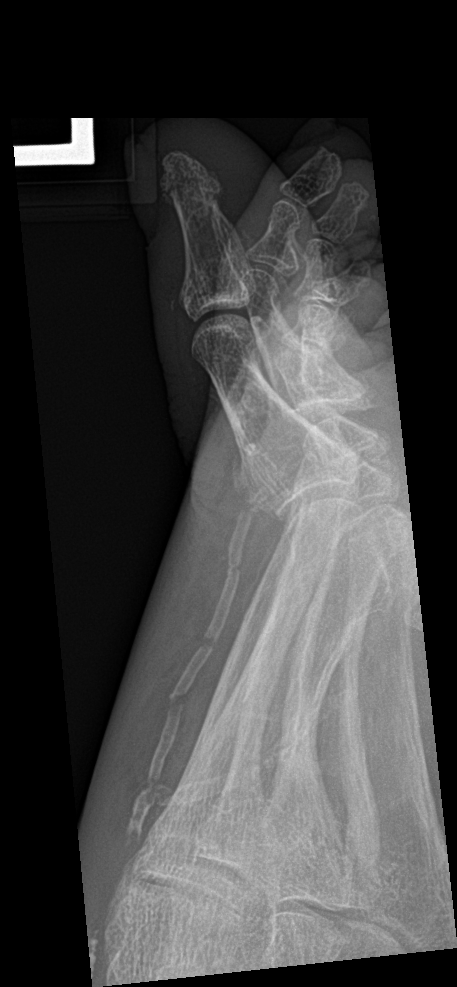

[3 of 3 positions shown; findings below may reference images not displayed]

FINDINGS: Nondisplaced tuft fracture of the great toe.  No dislocation.

Premature arterial calcification.

Osteopenia.
IMPRESSION: Nondisplaced tuft fracture of the great toe.

## 2016-02-08 IMAGING — CR DG CHEST 2V
2 series · 2 of 2 positions shown · non-contrast
Comparison: November 05, 25

CLINICAL DATA: Abdominal pain and distention for 4 days. Chest pain
for 2 days.

EXAM:
CHEST  2 VIEW

[chest lat]
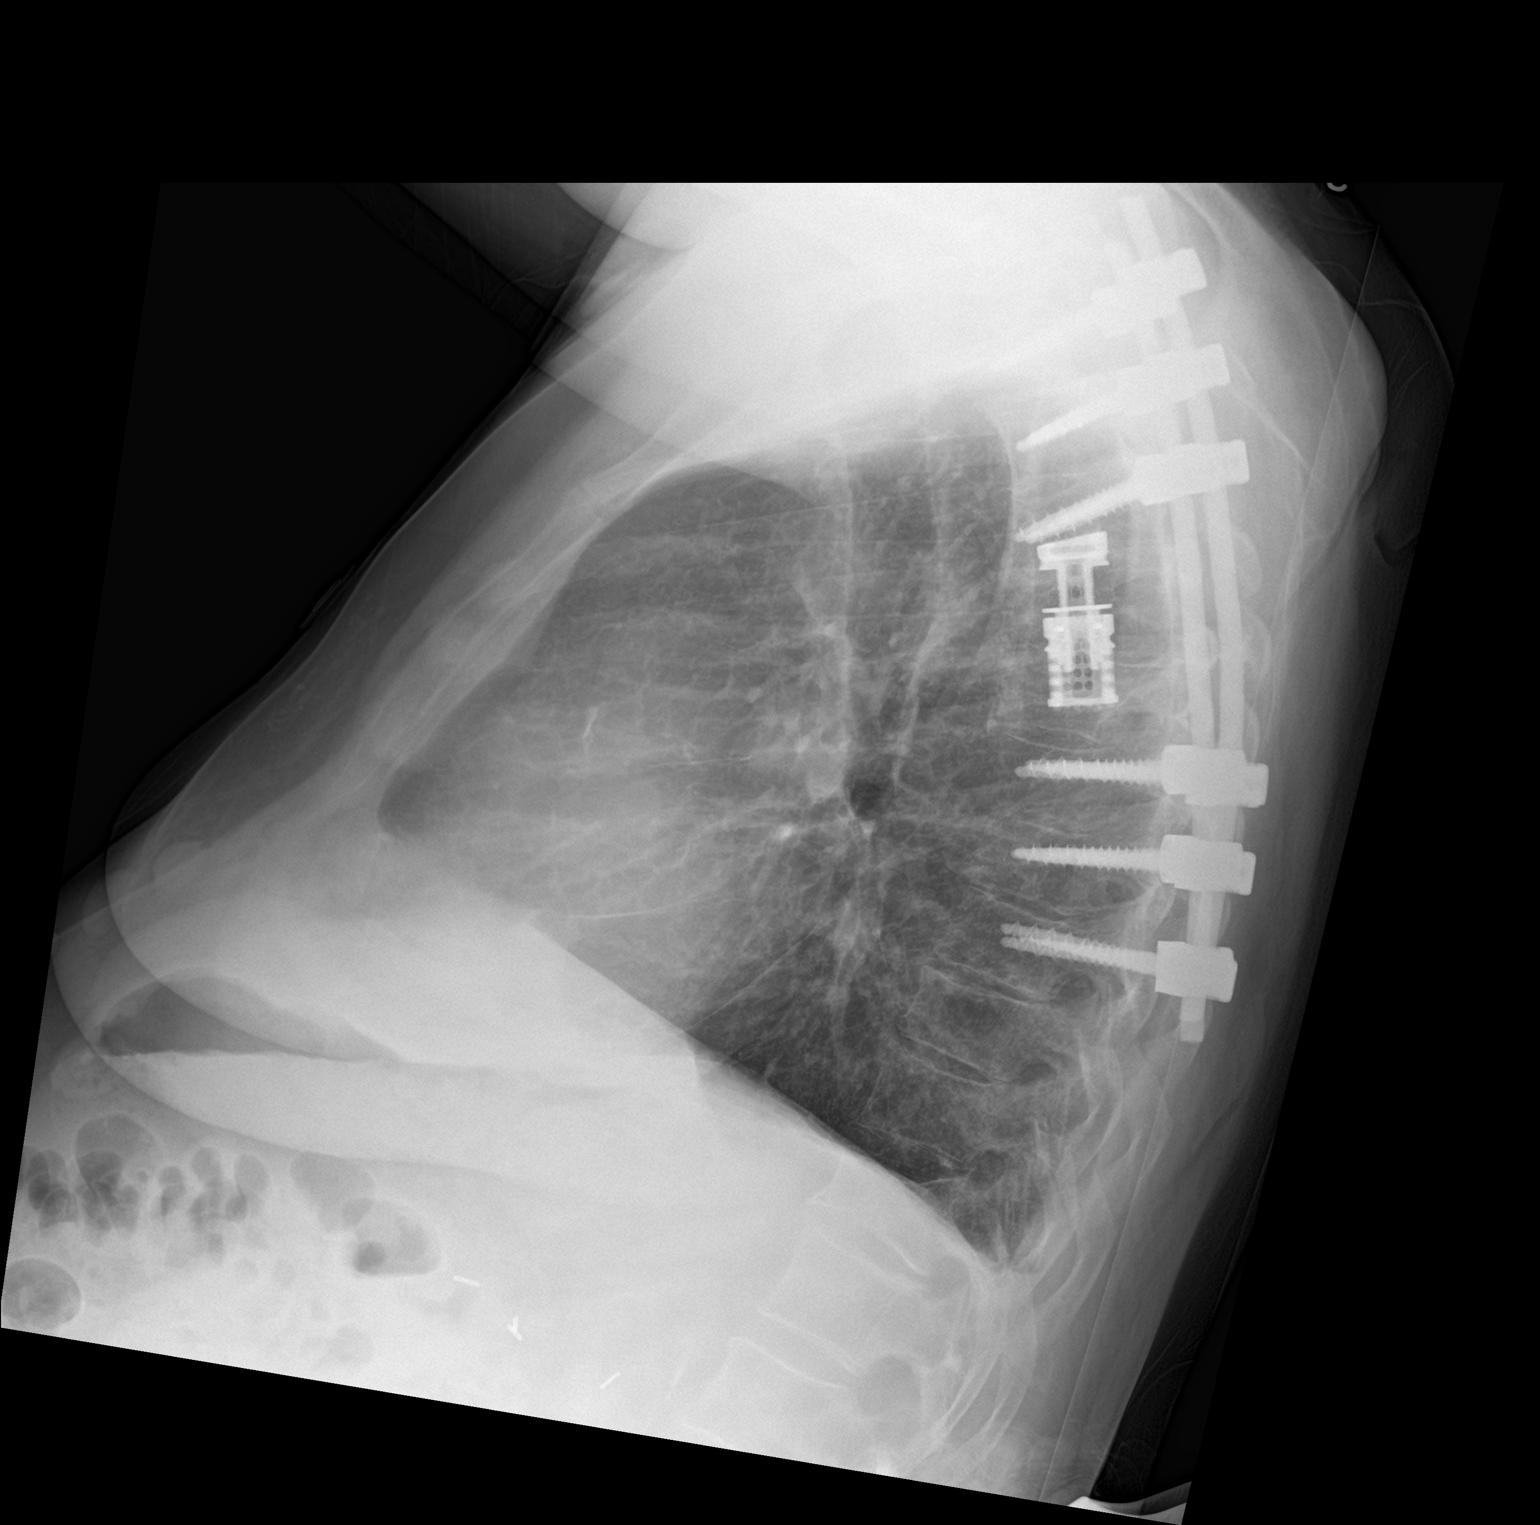

[chest ap]
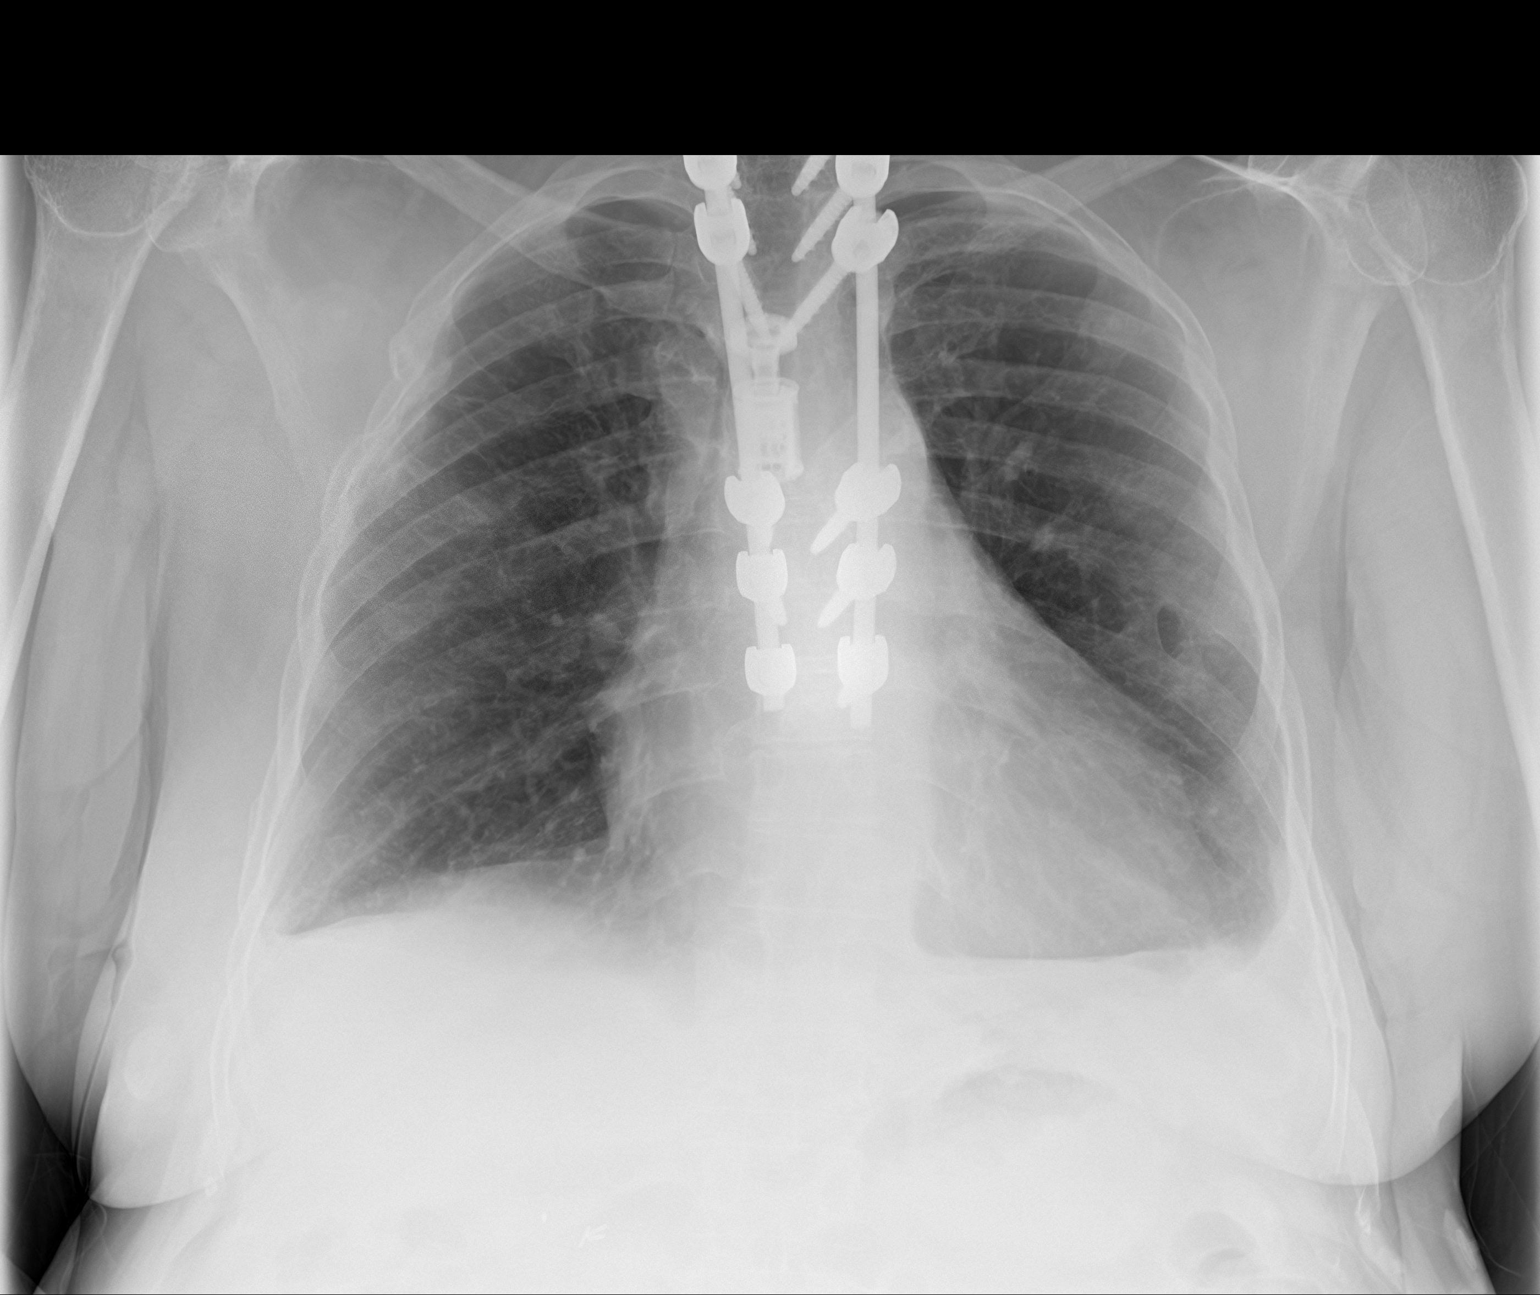

[2 of 2 positions shown; findings below may reference images not displayed]

FINDINGS: The heart size and mediastinal contours are stable. There is no
focal pneumonia or pulmonary edema. There are minimal bilateral
pleural effusions. The visualized skeletal structures are stable.
Patient status post prior posterior fusion of spine. Chronic
posttraumatic changes right ribs are stable.
IMPRESSION: Minimal bilateral pleural effusions. No focal pneumonia or pulmonary
edema.

## 2016-02-09 IMAGING — NM NM PULMONARY VENT & PERF
16 series · 16 of 16 positions shown · non-contrast
Comparison: None.

CLINICAL DATA: Elevated D-dimer. Chest pain and shortness of
breath.

EXAM:
NUCLEAR MEDICINE VENTILATION - PERFUSION LUNG SCAN
TECHNIQUE: Ventilation images were obtained in multiple projections using
inhaled aerosol 8c-BBm DTPA. Perfusion images were obtained in
multiple projections after intravenous injection of 8c-BBm MAA.
RADIOPHARMACEUTICALS:  30.0 9echnetium-HHm DTPA aerosol inhalation
and 4.0 9echnetium-HHm MAA IV

[Series 1: ant/post vent · 4.14mm/px · 1 of 1 slices shown (1 of 2)]
[im 1/1]
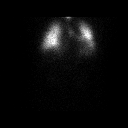

[Series 1: ant/post vent · 4.14mm/px · 1 of 1 slices shown (2 of 2)]
[im 1/1]
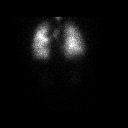

[Series 2: lao/rpo vent · 4.14mm/px · 1 of 1 slices shown (1 of 2)]
[im 1/1]
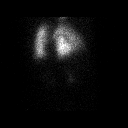

[Series 2: lao/rpo vent · 4.14mm/px · 1 of 1 slices shown (2 of 2)]
[im 1/1]
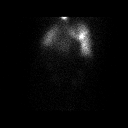

[Series 3: lpo/rao vent · 4.14mm/px · 1 of 1 slices shown (1 of 2)]
[im 1/1]
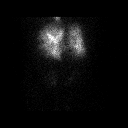

[Series 3: lpo/rao vent · 4.14mm/px · 1 of 1 slices shown (2 of 2)]
[im 1/1]
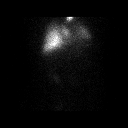

[Series 4: lt lat/rt lat vent · 4.14mm/px · 1 of 1 slices shown (1 of 2)]
[im 1/1]
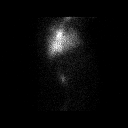

[Series 4: lt lat/rt lat vent · 4.14mm/px · 1 of 1 slices shown (2 of 2)]
[im 1/1]
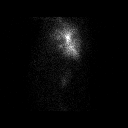

[Series 5: lt lat/rt lat perf · 4.14mm/px · 1 of 1 slices shown (1 of 2)]
[im 1/1]
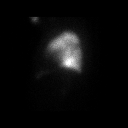

[Series 5: lt lat/rt lat perf · 4.14mm/px · 1 of 1 slices shown (2 of 2)]
[im 1/1]
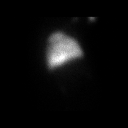

[Series 6: lpo/rao perf · 4.14mm/px · 1 of 1 slices shown (1 of 2)]
[im 1/1]
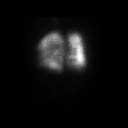

[Series 6: lpo/rao perf · 4.14mm/px · 1 of 1 slices shown (2 of 2)]
[im 1/1]
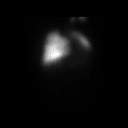

[Series 7: ant/post perf · 4.14mm/px · 1 of 1 slices shown (1 of 2)]
[im 1/1]
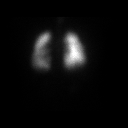

[Series 7: ant/post perf · 4.14mm/px · 1 of 1 slices shown (2 of 2)]
[im 1/1]
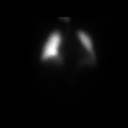

[Series 8: lao/rpo perf · 4.14mm/px · 1 of 1 slices shown (1 of 2)]
[im 1/1]
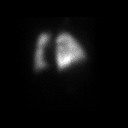

[Series 8: lao/rpo perf · 4.14mm/px · 1 of 1 slices shown (2 of 2)]
[im 1/1]
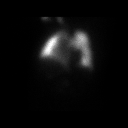

[16 of 16 positions shown; findings below may reference images not displayed]

FINDINGS: Ventilation: No focal ventilation defect.

Perfusion: No wedge shaped peripheral perfusion defects to suggest
acute pulmonary embolism. There is a photopenic defect identified in
the posterior left upper lobe which likely reflects attenuation
artifact from spinal hardware.
IMPRESSION: Low probability for acute pulmonary embolus.

## 2016-02-09 IMAGING — CT CT ABD-PELV W/O CM
2 of 4 series · 17 of 46 positions shown, 19 images · non-contrast
Comparison: 11/09/2015

CLINICAL DATA: Use abdominal pain.  Bloating for 4 days.

EXAM:
CT ABDOMEN AND PELVIS WITHOUT CONTRAST
TECHNIQUE: Multidetector CT imaging of the abdomen and pelvis was performed
following the standard protocol without IV contrast.

[Series 2: a/p w/o 5mm · axial · non-contrast · 0.74mm/px · z∈[-465,-110]mm · 14 of 77 slices shown, 16 images]
[im 3/77  soft-tissue]
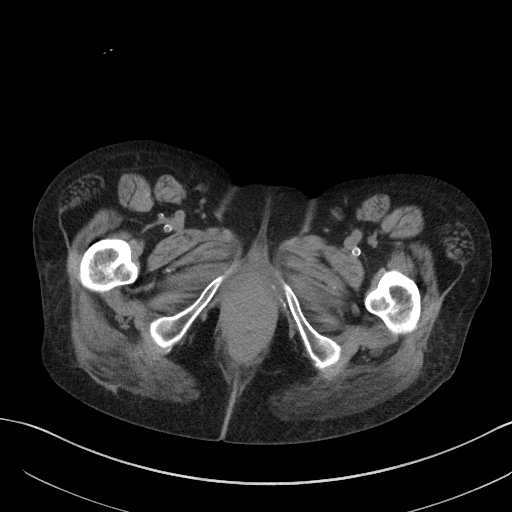
[im 3/77  bone]
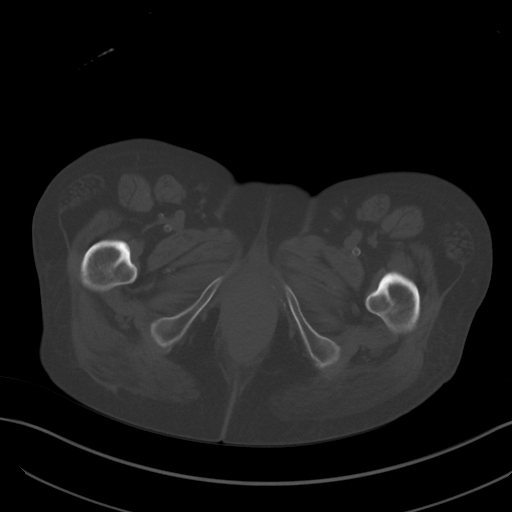
[im 9/77  soft-tissue]
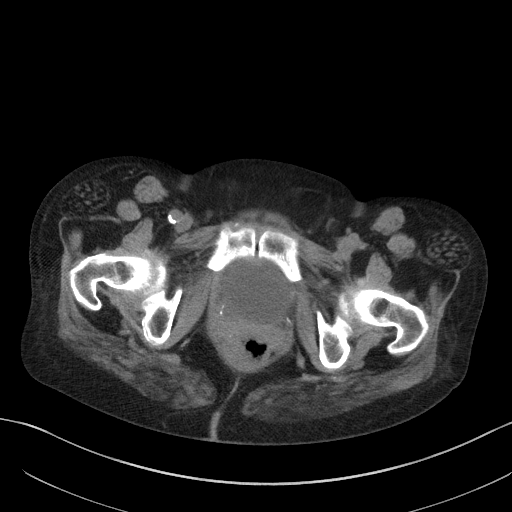
[im 15/77  soft-tissue]
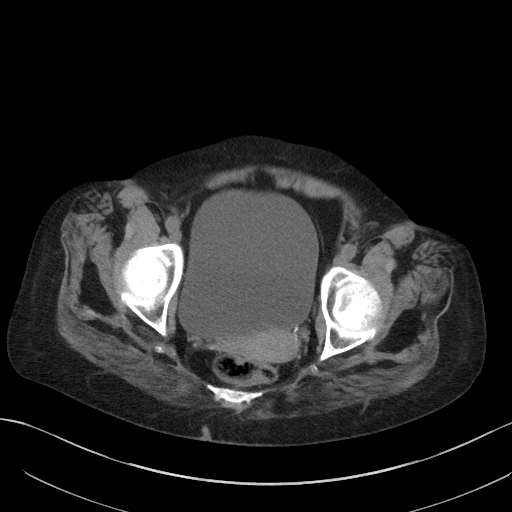
[im 21/77  soft-tissue]
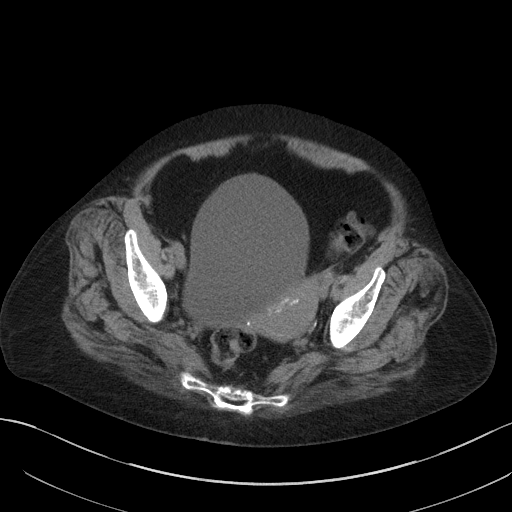
[im 27/77  soft-tissue]
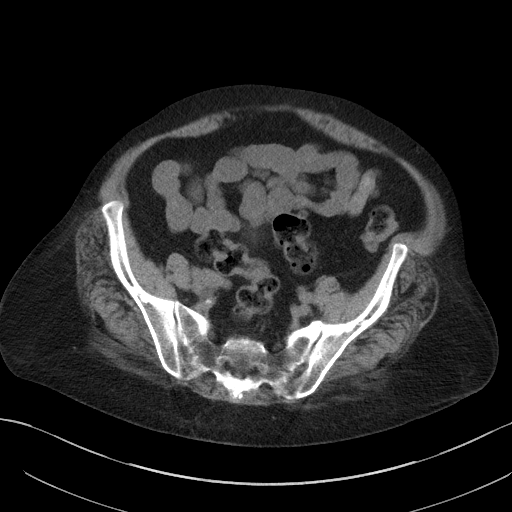
[im 30/77  soft-tissue]
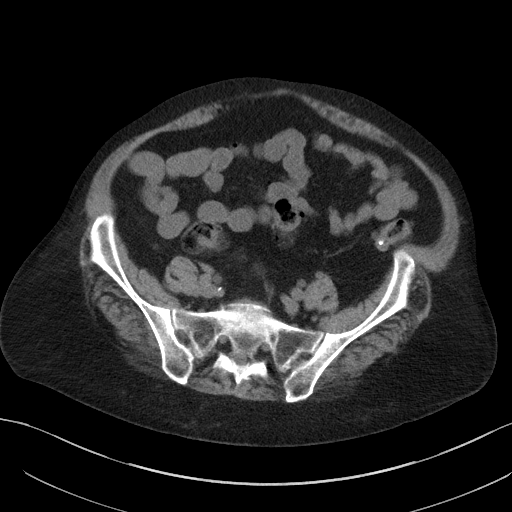
[im 36/77  soft-tissue]
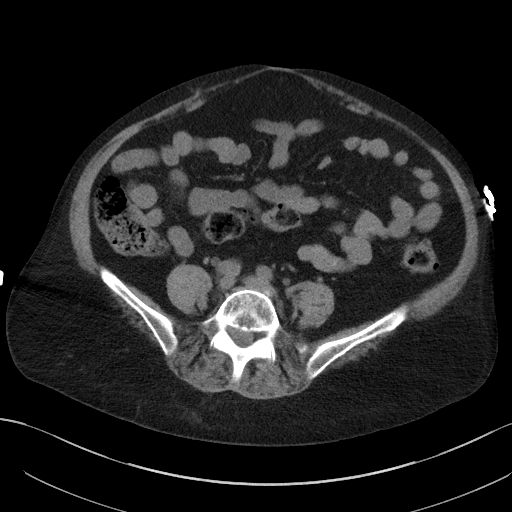
[im 41/77  soft-tissue]
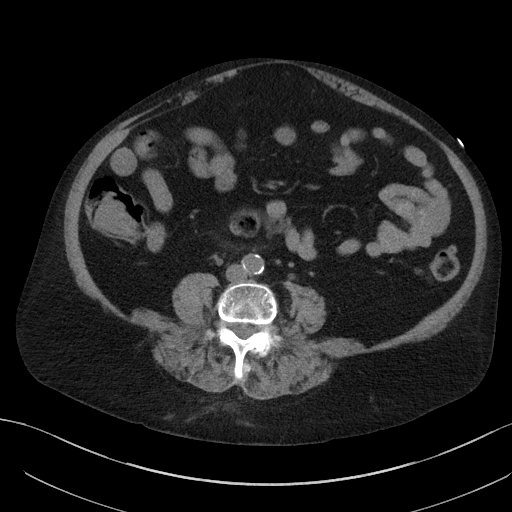
[im 47/77  soft-tissue]
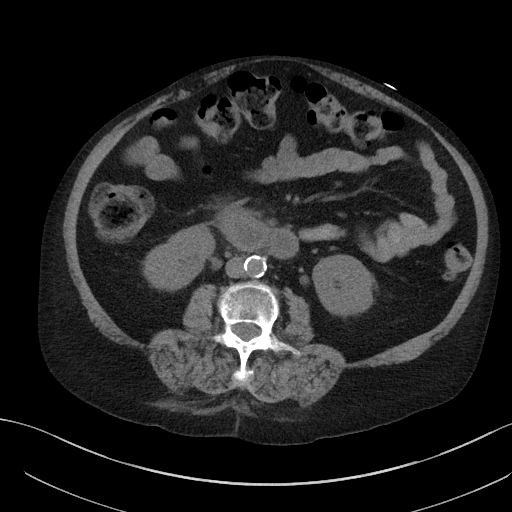
[im 47/77  bone]
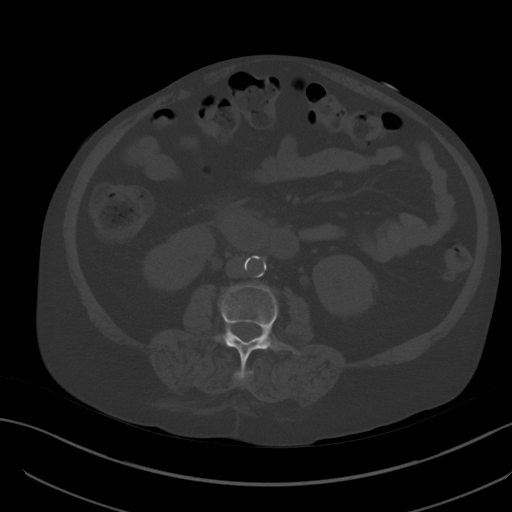
[im 50/77  soft-tissue]
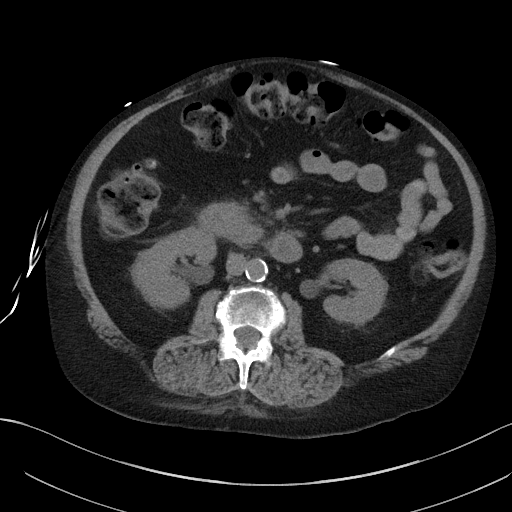
[im 56/77  soft-tissue]
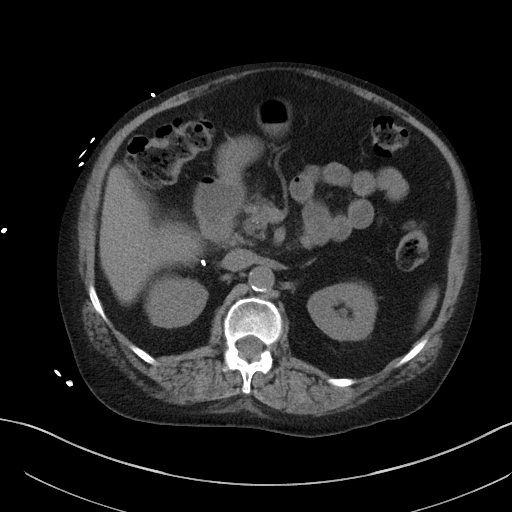
[im 62/77  soft-tissue]
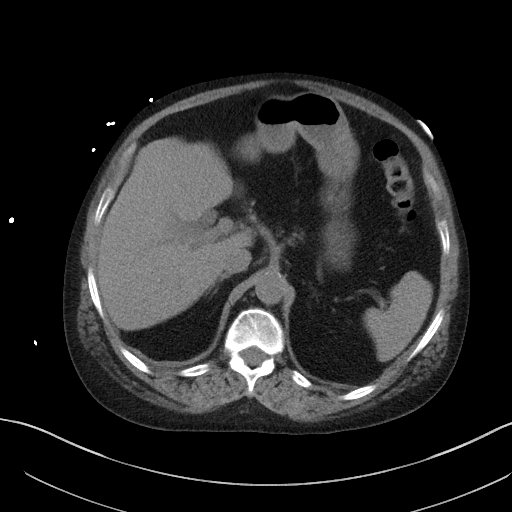
[im 68/77  soft-tissue]
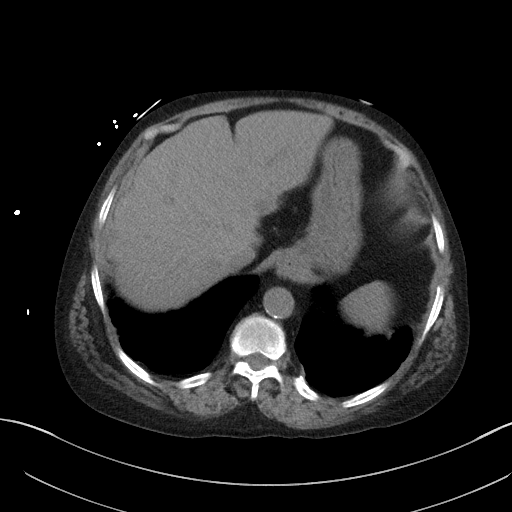
[im 74/77  soft-tissue]
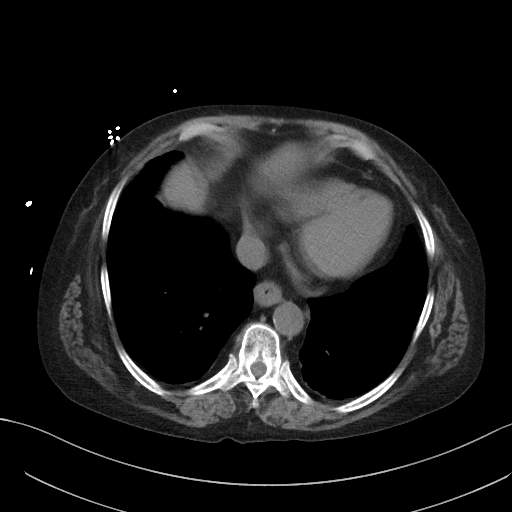

[Series 5: a/p w/o cor · coronal · non-contrast · 0.70mm/px · 3 of 134 slices shown]
[im 45/134  soft-tissue]
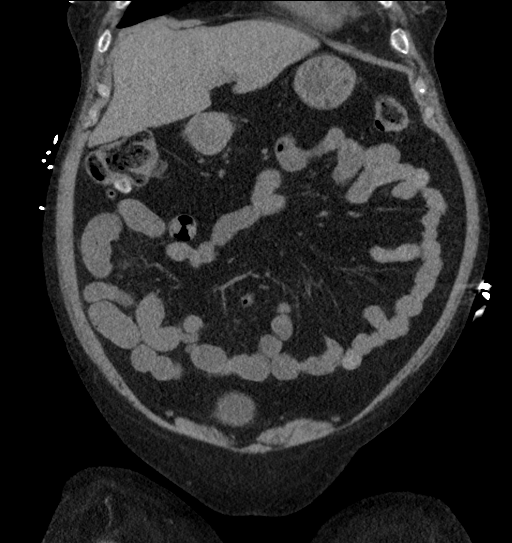
[im 60/134  soft-tissue]
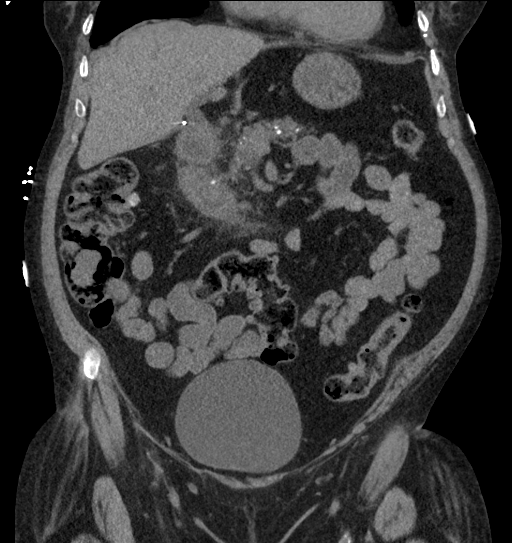
[im 74/134  soft-tissue]
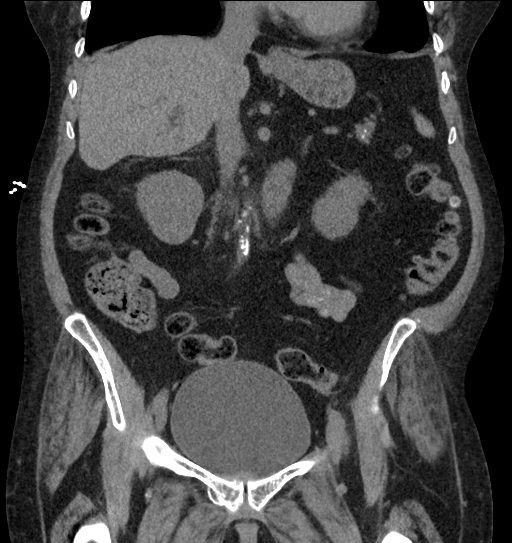

[17 of 46 positions shown; findings below may reference images not displayed]

FINDINGS: Lower chest and abdominal wall:  Fatty left groin hernia.

Hepatobiliary: Mild lobulated liver surface without definitive
cirrhosis. Cholecystectomy with normal common bile duct diameter.

Pancreas: Atrophic with calcifications. No duct enlargement. No
primary inflammation suspected.

Spleen: Unremarkable.

Adrenals/Urinary Tract: Negative adrenals. No hydronephrosis or
stone. Full bladder without acute superimposed finding.

Reproductive:No pathologic findings.

Stomach/Bowel: There is thickening and fat edema around the distal
duodenum consistent with duodenitis. No discrete ulceration is seen;
there are small diverticula but no focal diverticular inflammation.
Rapid development since prior is supportive of a primary
inflammatory process. Moderate colonic diverticulosis without
inflammation. No pericecal inflammation. Patient has history of
appendectomy.

Vascular/Lymphatic: No acute vascular abnormality. No mass or
adenopathy.

Peritoneal: No ascites or pneumoperitoneum.

Musculoskeletal: No acute abnormalities. Remote T12 inferior
endplate fracture. Disc and facet degeneration focally advanced at
L4-5 where there is mild anterolisthesis.
IMPRESSION: 1. Distal duodenitis.
2. Chronic pancreatitis.
3. Incidental findings are stable and described above.

## 2016-02-18 IMAGING — DX DG CHEST 2V
2 series · 2 of 2 positions shown · non-contrast
Comparison: 12/23/2015

CLINICAL DATA: Shortness of breath, cough, chest pain

EXAM:
CHEST  2 VIEW

[chest lat]
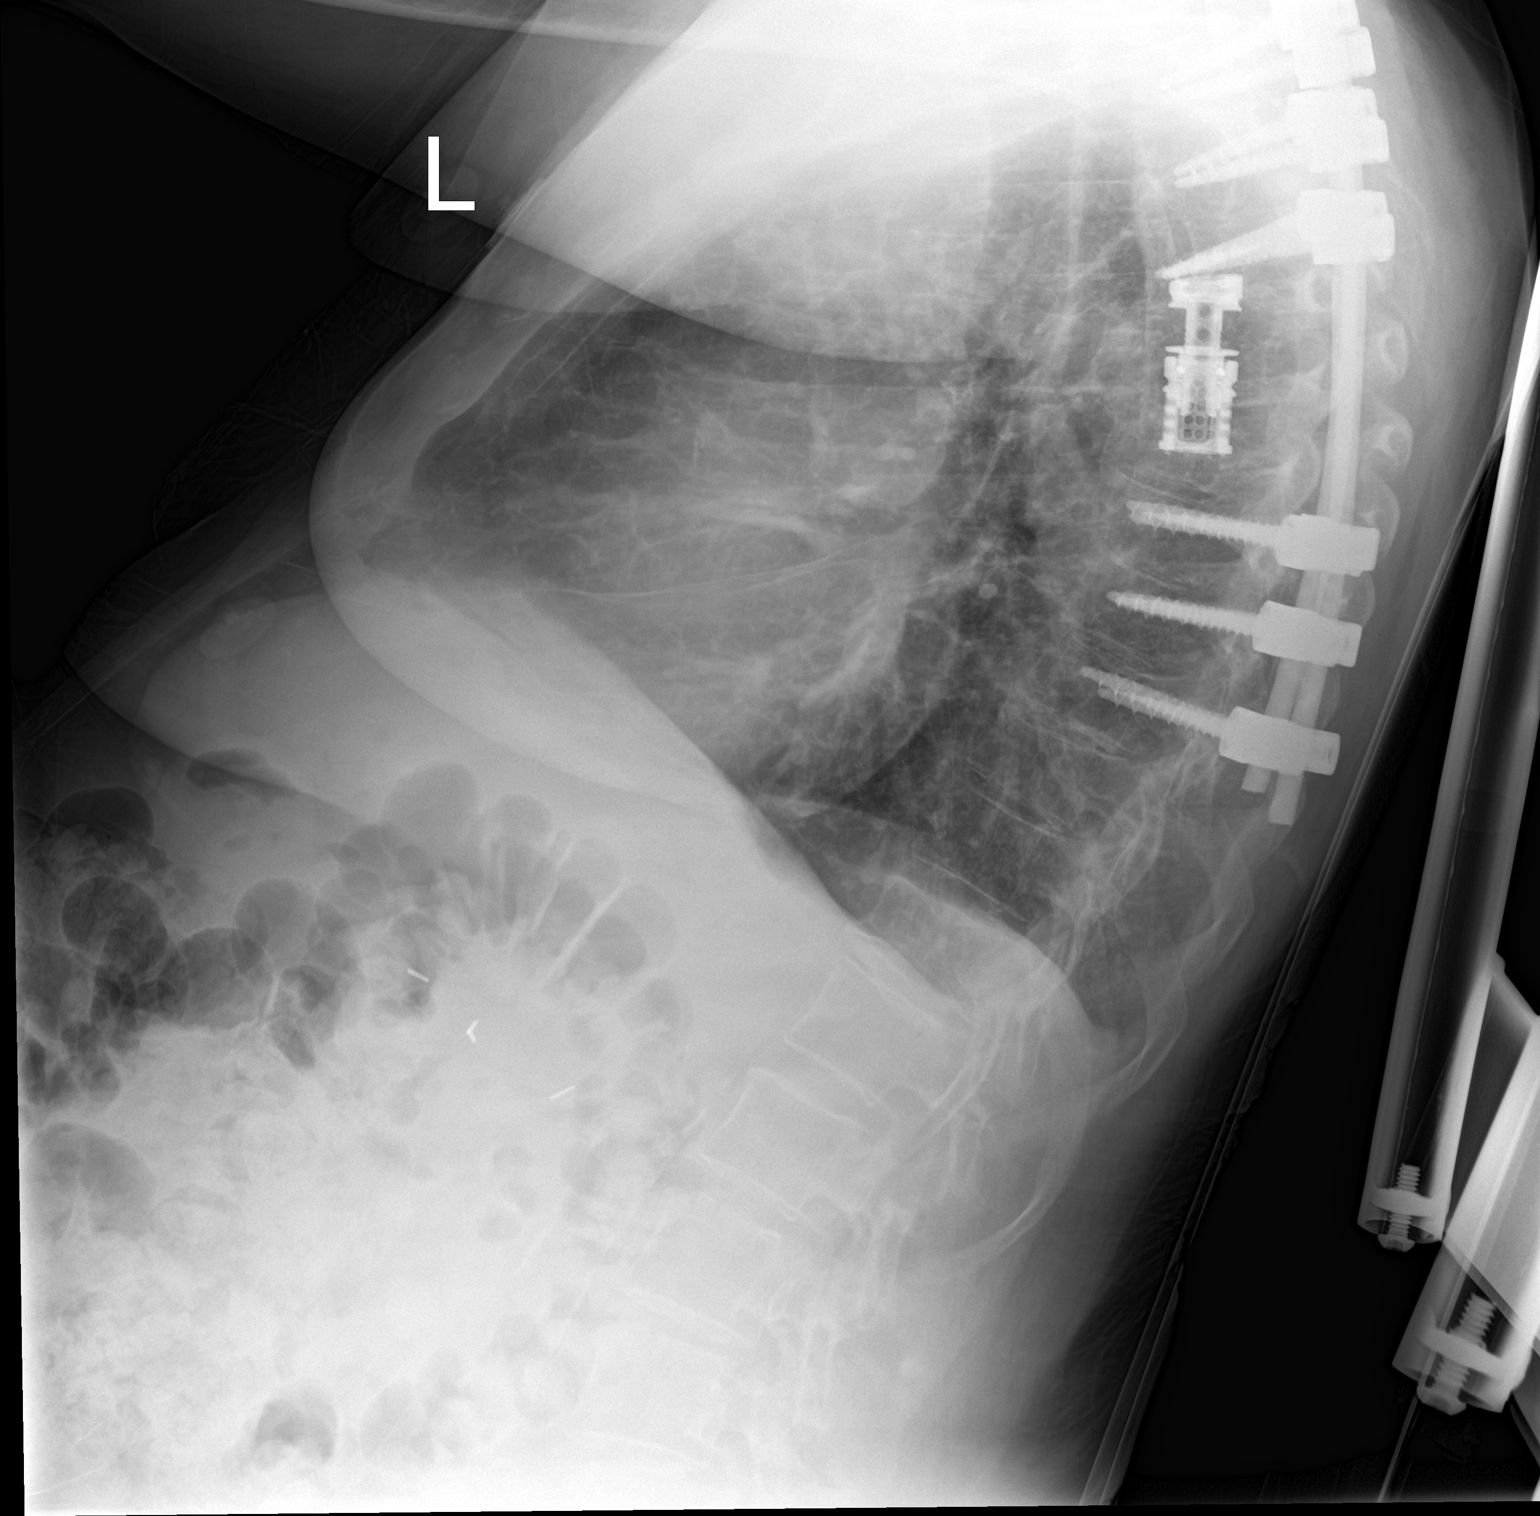

[chest ap]
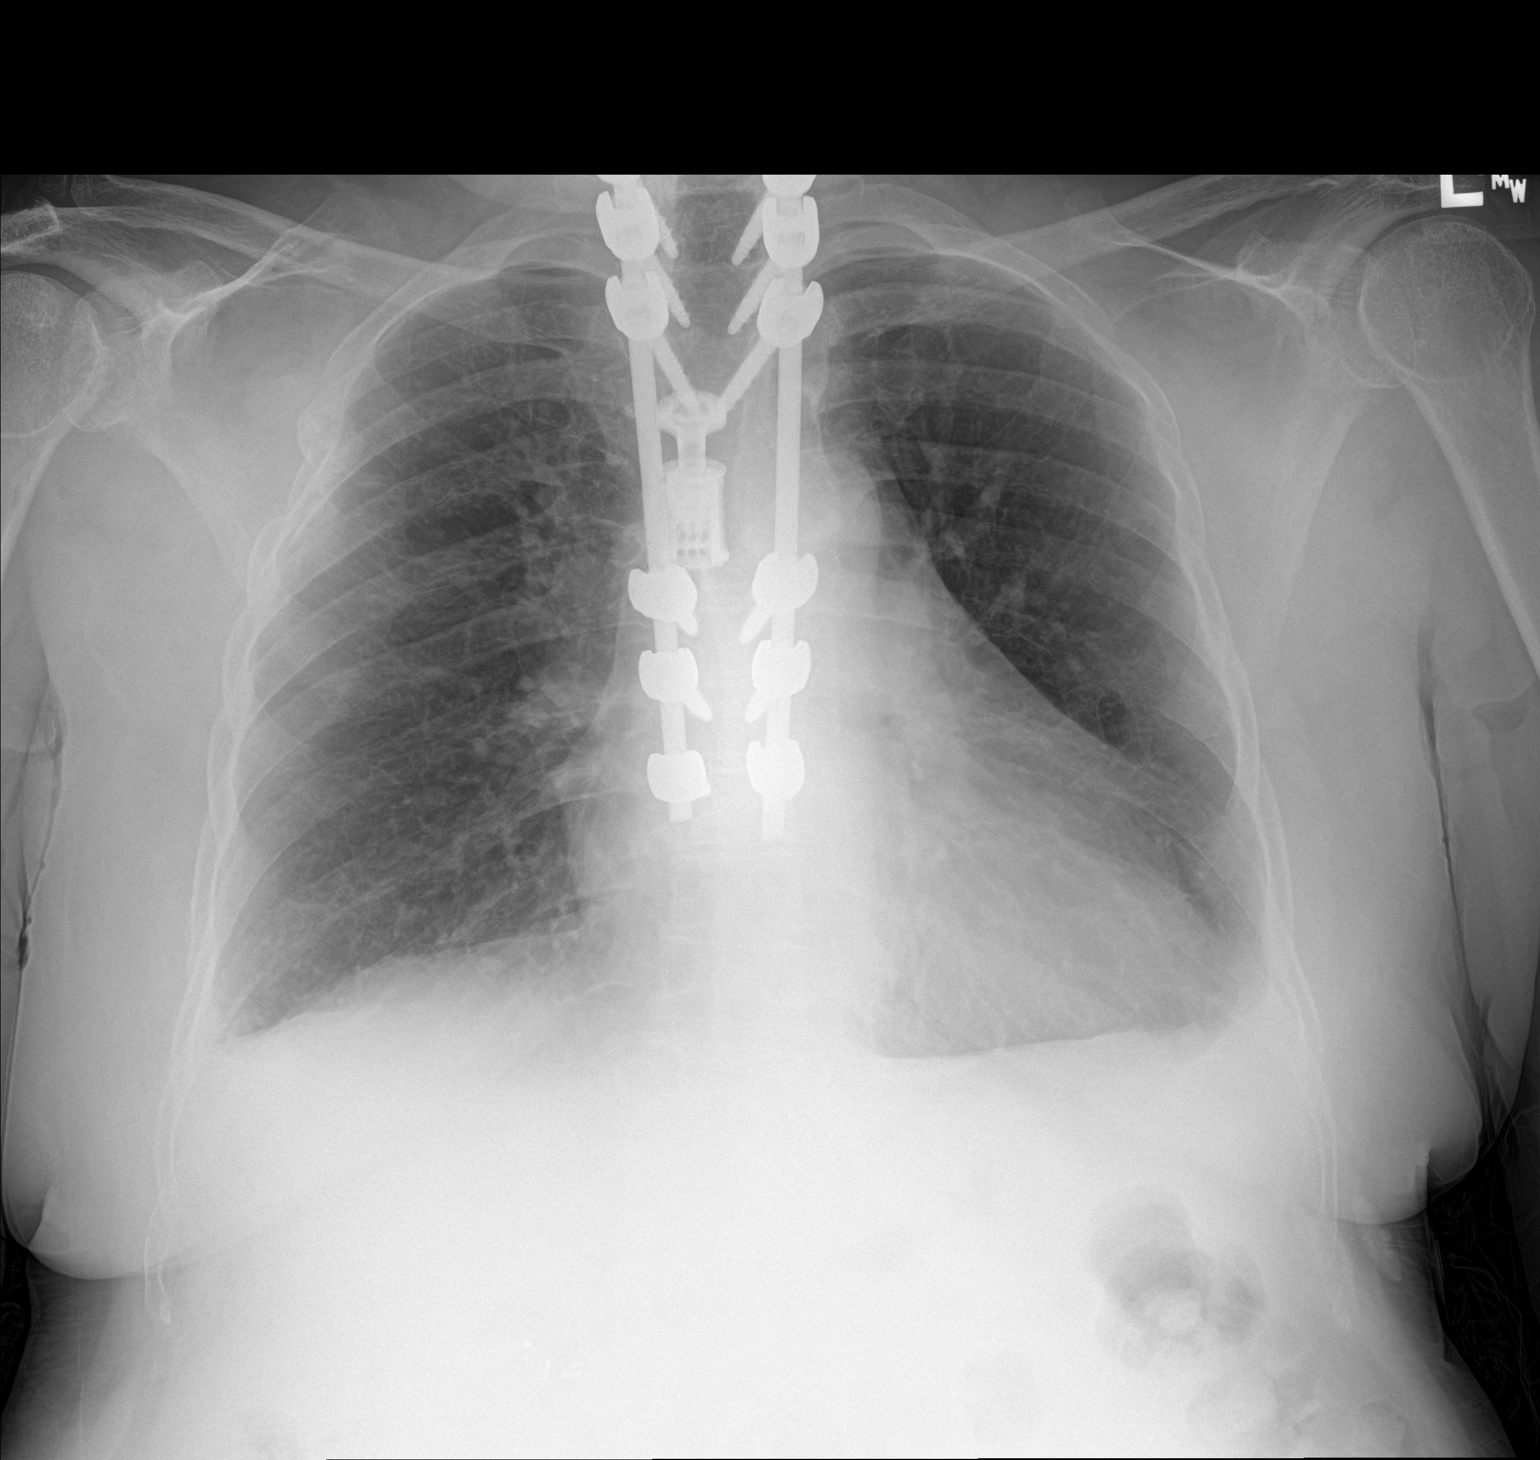

[2 of 2 positions shown; findings below may reference images not displayed]

FINDINGS: Chronic blunting of the left costophrenic angle, possibly reflecting
a small left pleural effusion. No focal consolidation. No
pneumothorax.

Cardiomegaly.

Thoracic spinal fixation.
IMPRESSION: Possible small left pleural effusion, unchanged.

## 2016-02-19 ENCOUNTER — Emergency Department (HOSPITAL_COMMUNITY): Payer: Medicaid Other

## 2016-02-19 ENCOUNTER — Emergency Department (HOSPITAL_COMMUNITY)
Admission: EM | Admit: 2016-02-19 | Discharge: 2016-02-19 | Disposition: A | Discharge status: Home / Self Care | Payer: Medicaid Other | Attending: Emergency Medicine | Admitting: Emergency Medicine

## 2016-02-19 ENCOUNTER — Encounter (HOSPITAL_COMMUNITY): Payer: Self-pay

## 2016-02-19 DIAGNOSIS — W1839XA Other fall on same level, initial encounter: Secondary | ICD-10-CM | POA: Diagnosis not present

## 2016-02-19 DIAGNOSIS — R569 Unspecified convulsions: Secondary | ICD-10-CM | POA: Insufficient documentation

## 2016-02-19 DIAGNOSIS — S29001A Unspecified injury of muscle and tendon of front wall of thorax, initial encounter: Secondary | ICD-10-CM | POA: Diagnosis not present

## 2016-02-19 DIAGNOSIS — Y9389 Activity, other specified: Secondary | ICD-10-CM | POA: Diagnosis not present

## 2016-02-19 DIAGNOSIS — Z87891 Personal history of nicotine dependence: Secondary | ICD-10-CM | POA: Insufficient documentation

## 2016-02-19 DIAGNOSIS — K5900 Constipation, unspecified: Secondary | ICD-10-CM | POA: Insufficient documentation

## 2016-02-19 DIAGNOSIS — R350 Frequency of micturition: Secondary | ICD-10-CM | POA: Diagnosis not present

## 2016-02-19 DIAGNOSIS — Z79899 Other long term (current) drug therapy: Secondary | ICD-10-CM | POA: Diagnosis not present

## 2016-02-19 DIAGNOSIS — Y998 Other external cause status: Secondary | ICD-10-CM | POA: Insufficient documentation

## 2016-02-19 DIAGNOSIS — E119 Type 2 diabetes mellitus without complications: Secondary | ICD-10-CM | POA: Diagnosis not present

## 2016-02-19 DIAGNOSIS — G8929 Other chronic pain: Secondary | ICD-10-CM | POA: Insufficient documentation

## 2016-02-19 DIAGNOSIS — J45909 Unspecified asthma, uncomplicated: Secondary | ICD-10-CM | POA: Diagnosis not present

## 2016-02-19 DIAGNOSIS — Z7982 Long term (current) use of aspirin: Secondary | ICD-10-CM | POA: Insufficient documentation

## 2016-02-19 DIAGNOSIS — Z8701 Personal history of pneumonia (recurrent): Secondary | ICD-10-CM | POA: Insufficient documentation

## 2016-02-19 DIAGNOSIS — Y9289 Other specified places as the place of occurrence of the external cause: Secondary | ICD-10-CM | POA: Diagnosis not present

## 2016-02-19 DIAGNOSIS — R531 Weakness: Secondary | ICD-10-CM | POA: Insufficient documentation

## 2016-02-19 DIAGNOSIS — M1990 Unspecified osteoarthritis, unspecified site: Secondary | ICD-10-CM | POA: Insufficient documentation

## 2016-02-19 DIAGNOSIS — S0990XA Unspecified injury of head, initial encounter: Secondary | ICD-10-CM | POA: Diagnosis present

## 2016-02-19 DIAGNOSIS — Z7952 Long term (current) use of systemic steroids: Secondary | ICD-10-CM | POA: Insufficient documentation

## 2016-02-19 DIAGNOSIS — G4489 Other headache syndrome: Secondary | ICD-10-CM | POA: Diagnosis not present

## 2016-02-19 DIAGNOSIS — I10 Essential (primary) hypertension: Secondary | ICD-10-CM | POA: Diagnosis not present

## 2016-02-19 DIAGNOSIS — Z791 Long term (current) use of non-steroidal anti-inflammatories (NSAID): Secondary | ICD-10-CM | POA: Insufficient documentation

## 2016-02-19 DIAGNOSIS — Z7984 Long term (current) use of oral hypoglycemic drugs: Secondary | ICD-10-CM | POA: Diagnosis not present

## 2016-02-19 DIAGNOSIS — K219 Gastro-esophageal reflux disease without esophagitis: Secondary | ICD-10-CM | POA: Diagnosis not present

## 2016-02-19 DIAGNOSIS — R079 Chest pain, unspecified: Secondary | ICD-10-CM

## 2016-02-19 LAB — CBC WITH DIFFERENTIAL/PLATELET
Basophils Absolute: 0 10*3/uL (ref 0.0–0.1)
Basophils Relative: 0 %
EOS ABS: 0 10*3/uL (ref 0.0–0.7)
EOS PCT: 0 %
HCT: 31.8 % — ABNORMAL LOW (ref 36.0–46.0)
Hemoglobin: 10 g/dL — ABNORMAL LOW (ref 12.0–15.0)
LYMPHS ABS: 4.3 10*3/uL — AB (ref 0.7–4.0)
LYMPHS PCT: 37 %
MCH: 24.2 pg — AB (ref 26.0–34.0)
MCHC: 31.4 g/dL (ref 30.0–36.0)
MCV: 76.8 fL — AB (ref 78.0–100.0)
MONOS PCT: 7 %
Monocytes Absolute: 0.8 10*3/uL (ref 0.1–1.0)
Neutro Abs: 6.3 10*3/uL (ref 1.7–7.7)
Neutrophils Relative %: 56 %
Platelets: 239 10*3/uL (ref 150–400)
RBC: 4.14 MIL/uL (ref 3.87–5.11)
RDW: 16 % — ABNORMAL HIGH (ref 11.5–15.5)
WBC: 11.5 10*3/uL — AB (ref 4.0–10.5)

## 2016-02-19 LAB — URINALYSIS, ROUTINE W REFLEX MICROSCOPIC
Bilirubin Urine: NEGATIVE
GLUCOSE, UA: NEGATIVE mg/dL
HGB URINE DIPSTICK: NEGATIVE
KETONES UR: NEGATIVE mg/dL
Leukocytes, UA: NEGATIVE
Nitrite: NEGATIVE
PROTEIN: NEGATIVE mg/dL
Specific Gravity, Urine: <1.005 — ABNORMAL LOW (ref 1.005–1.030)
pH: 7 (ref 5.0–8.0)

## 2016-02-19 LAB — COMPREHENSIVE METABOLIC PANEL
ALK PHOS: 84 U/L (ref 38–126)
ALT: 29 U/L (ref 14–54)
ANION GAP: 11 (ref 5–15)
AST: 31 U/L (ref 15–41)
Albumin: 3.1 g/dL — ABNORMAL LOW (ref 3.5–5.0)
BILIRUBIN TOTAL: 0.5 mg/dL (ref 0.3–1.2)
BUN: 10 mg/dL (ref 6–20)
CALCIUM: 8.3 mg/dL — AB (ref 8.9–10.3)
CO2: 19 mmol/L — ABNORMAL LOW (ref 22–32)
Chloride: 103 mmol/L (ref 101–111)
Creatinine, Ser: 1.05 mg/dL — ABNORMAL HIGH (ref 0.44–1.00)
GFR calc non Af Amer: 58 mL/min — ABNORMAL LOW (ref >60)
Glucose, Bld: 100 mg/dL — ABNORMAL HIGH (ref 65–99)
Potassium: 3.4 mmol/L — ABNORMAL LOW (ref 3.5–5.1)
SODIUM: 133 mmol/L — AB (ref 135–145)
TOTAL PROTEIN: 6.2 g/dL — AB (ref 6.5–8.1)

## 2016-02-19 LAB — I-STAT TROPONIN, ED: TROPONIN I, POC: 0 ng/mL (ref 0.00–0.08)

## 2016-02-19 LAB — LIPASE, BLOOD: Lipase: 22 U/L (ref 11–51)

## 2016-02-19 MED ORDER — SODIUM CHLORIDE 0.9 % IV BOLUS (SEPSIS)
500.0000 mL | Freq: Once | INTRAVENOUS | Status: AC
  Administered 2016-02-19: 500 mL via INTRAVENOUS

## 2016-02-19 NOTE — ED Notes (Signed)
Pt stable, ambulatory, states understanding of discharge instructions 

## 2016-02-19 NOTE — ED Notes (Signed)
Pt arrived via EMS c/o headache radiating down into her chest, pt states she possibly had a seizure at home tonight.  Pt also c/o a yeast infection and now has uti symptoms with intermittent abdominal pain.  EMS gave NS, pt took 324mg  ASA at home. CBG 194.

## 2016-02-19 NOTE — ED Provider Notes (Signed)
CSN: [id number]     Arrival date & time 02/19/16  0111 History  By signing my name below, I, [name], attest that this documentation has been prepared under the direction and in the presence of Zadie Rhine, MD. Electronically Signed: Evon Slack, ED Scribe. 02/19/2016. 1:31 AM.     Chief Complaint  Patient presents with  . Headache  . Chest Pain  . Abdominal Pain  . Urinary Frequency  . Vaginitis  . Seizures    Patient is a 60 y.o. female presenting with headaches, chest pain, abdominal pain, frequency, and seizures. The history is provided by the patient. No language interpreter was used.  Headache Pain location:  L parietal Radiates to:  Face and L neck Onset quality:  Gradual Duration:  2 days Relieved by:  None tried Ineffective treatments:  None tried Associated symptoms: abdominal pain and seizures   Associated symptoms: no diarrhea, no fever, no nausea and no vomiting   Chest Pain Associated symptoms: abdominal pain and headache   Associated symptoms: no fever, no nausea and not vomiting   Abdominal Pain Associated symptoms: chest pain and constipation   Associated symptoms: no diarrhea, no fever, no nausea and no vomiting   Urinary Frequency Associated symptoms include chest pain, abdominal pain and headaches.  Seizures  HPI Comments: Arriona Prest is a 60 y.o. female brought in by ambulance, who presents to the Emergency Department complaining of left HA onset 2 days prior. She states that the HA is radiating to her left face down her neck and to her chest. Pt also reports that she also fell tonight. Pt states that she may have had a seizure but she is unsure, she states that she is having trouble remembering. Pt also reports having CP, abdominal pain, constipation, right knee pain and worsening weakness. Pt states she is not prescribed any medications for her seizures. Pt states that at baseline she ambulates with a walker. Pt denies fever, nausea or  vomiting. Pt does report HX of rheumatoid arthritis     Past Medical History  Diagnosis Date  . Arthritis   . Asthma   . Hypertension   . Constipation   . GERD (gastroesophageal reflux disease)   . Diabetes mellitus without complication (HCC)   . Chronic pain   . Seizures (HCC)     last seizure March 2015  . Pneumonia 10/2015   Past Surgical History  Procedure Laterality Date  . Back surgery    . Cholecystectomy    . Appendectomy     Family History  Problem Relation Age of Onset  . Kidney failure Mother   . Hypertension Sister   . Hypertension Brother    Social History  Substance Use Topics  . Smoking status: Former Games developer  . Smokeless tobacco: Never Used  . Alcohol Use: No     Comment: admits to 2 drinks/week   OB History    No data available      Review of Systems  Constitutional: Negative for fever.  Cardiovascular: Positive for chest pain.  Gastrointestinal: Positive for abdominal pain and constipation. Negative for nausea, vomiting and diarrhea.  Genitourinary: Positive for frequency.  Neurological: Positive for seizures and headaches.  All other systems reviewed and are negative.    Allergies  Ivp dye  Home Medications   Prior to Admission medications   Medication Sig Start Date End Date Taking? Authorizing Provider  albuterol (PROVENTIL HFA;VENTOLIN HFA) 108 (90 BASE) MCG/ACT inhaler Inhale 2 puffs into the lungs every  6 (six) hours as needed for wheezing or shortness of breath.    Historical Provider, MD  amLODipine (NORVASC) 5 MG tablet Take 5 mg by mouth daily.    Historical Provider, MD  aspirin EC 81 MG tablet Take 1 tablet (81 mg total) by mouth daily. 06/14/15   Richarda Overlie, MD  atorvastatin (LIPITOR) 10 MG tablet Take 10 mg by mouth daily. 08/13/15   Historical Provider, MD  cholecalciferol (VITAMIN D) 1000 units tablet Take 2,000 Units by mouth daily.    Historical Provider, MD  cyclobenzaprine (FLEXERIL) 10 MG tablet Take 1 tablet (10 mg  total) by mouth 2 (two) times daily as needed for muscle spasms. 01/31/16   Arthor Captain, PA-C  dextromethorphan-guaiFENesin (MUCINEX DM) 30-600 MG 12hr tablet Take 1 tablet by mouth 2 (two) times daily. 01/02/16   Vanetta Mulders, MD  diazepam (VALIUM) 5 MG tablet Take 1 tablet (5 mg total) by mouth every 12 (twelve) hours as needed for muscle spasms. 08/25/15   Rodolph Bong, MD  feeding supplement, ENSURE ENLIVE, (ENSURE ENLIVE) LIQD Take 237 mLs by mouth 2 (two) times daily between meals. 11/10/15   Catarina Hartshorn, MD  Fluticasone-Salmeterol (ADVAIR) 250-50 MCG/DOSE AEPB Inhale 1 puff into the lungs 2 (two) times daily.    Historical Provider, MD  folic acid (FOLVITE) 1 MG tablet Take 1 mg by mouth daily.    Historical Provider, MD  furosemide (LASIX) 40 MG tablet Take 1 tablet (40 mg total) by mouth daily. 06/14/15   Richarda Overlie, MD  gabapentin (NEURONTIN) 300 MG capsule Take 1 capsule (300 mg total) by mouth 2 (two) times daily. 06/14/15   Richarda Overlie, MD  HYDROcodone-acetaminophen (NORCO/VICODIN) 5-325 MG tablet Take 1-2 tablets by mouth every 6 (six) hours as needed for moderate pain. 01/02/16   Vanetta Mulders, MD  LINZESS 145 MCG CAPS capsule Take 145 mcg by mouth daily as needed.  08/13/15   Historical Provider, MD  metFORMIN (GLUCOPHAGE-XR) 500 MG 24 hr tablet Take 500 mg by mouth daily with breakfast.    Historical Provider, MD  metoprolol succinate (TOPROL-XL) 25 MG 24 hr tablet Take 25 mg by mouth daily.    Historical Provider, MD  naproxen sodium (ANAPROX) 275 MG tablet Take 1 tablet (275 mg total) by mouth 2 (two) times daily with a meal. 01/31/16   Arthor Captain, PA-C  Oxycodone HCl 20 MG TABS Take 1 tablet (20 mg total) by mouth every 4 (four) hours. Refill by primary MD Patient not taking: Reported on 01/02/2016 12/25/15   Ripudeep Jenna Luo, MD  oxymorphone (OPANA ER) 40 MG 12 hr tablet Take 1 tablet (40 mg total) by mouth every 12 (twelve) hours. Refill by primary MD Patient not taking:  Reported on 01/02/2016 12/25/15   Ripudeep Jenna Luo, MD  pantoprazole (PROTONIX) 40 MG tablet Take 1 tablet (40 mg total) by mouth 2 (two) times daily before a meal. 12/25/15   Ripudeep K Rai, MD  polyethylene glycol (MIRALAX / GLYCOLAX) packet Take 17 g by mouth 2 (two) times daily. 06/14/15   Richarda Overlie, MD  potassium chloride SA (K-DUR,KLOR-CON) 20 MEQ tablet Take 2 tablets (40 mEq total) by mouth daily. 01/27/15   Calvert Cantor, MD  predniSONE (DELTASONE) 10 MG tablet Take 4 tablets (40 mg total) by mouth daily. 01/02/16   Vanetta Mulders, MD  predniSONE (DELTASONE) 5 MG tablet Take 15 mg by mouth daily. 12/14/15   Historical Provider, MD  promethazine (PHENERGAN) 25 MG tablet Take 1 tablet  by mouth every 6 (six) hours as needed for nausea or vomiting. nausea 10/06/15   Historical Provider, MD  tiZANidine (ZANAFLEX) 4 MG tablet Take 4 mg by mouth 4 (four) times daily. 08/13/15   Historical Provider, MD  traMADol (ULTRAM) 50 MG tablet Take 1 tablet (50 mg total) by mouth every 8 (eight) hours as needed for moderate pain. For arthritis pain 12/25/15 12/24/16  Ripudeep Jenna Luo, MD  Vitamin D, Ergocalciferol, (DRISDOL) 50000 UNITS CAPS capsule Take 1 capsule (50,000 Units total) by mouth every 7 (seven) days. Sunday Patient taking differently: Take 50,000 Units by mouth every 7 (seven) days.  01/27/15   Calvert Cantor, MD  VITAMIN E PO Take 1 capsule by mouth daily.    Historical Provider, MD   SpO2 98%   Physical Exam   CONSTITUTIONAL: chronically ill appearing  HEAD: Normocephalic/atraumatic, no visible trauma EYES: EOMI/PERRL ENMT: Mucous membranes moist NECK: supple no meningeal signs, no bruits  SPINE/BACK:entire spine nontender CV: S1/S2 noted, no murmurs/rubs/gallops noted CHEST: left sided chest wall tenderness LUNGS: Lungs are clear to auscultation bilaterally, no apparent distress ABDOMEN: soft, nontender, no rebound or guarding, bowel sounds noted throughout abdomen GU:no cva tenderness NEURO: Pt is  awake/alert/appropriate, moves all extremitiesx4.  No facial droop. No arm or leg drift. Pt able to ambulate with assistance, no pass pointing   EXTREMITIES: pulses normal/equal, full ROM, no deformities noted  SKIN: warm, color normal PSYCH: no abnormalities of mood noted, alert and oriented to situation  ED Course  Procedures  DIAGNOSTIC STUDIES: Oxygen Saturation is 98% on RA, normal by my interpretation.    COORDINATION OF CARE: 1:27 AM-Discussed treatment plan with pt at bedside and pt agreed to plan.   4:09 AM Pt improved No distress No focal abd tenderness She is near baseline She thinks she had seizure tonight, reports h/o seizure.  Also reports previous HAs as well.  No neuro deficits at this time, will defer any neuroimaging at this time She did report foul smelling urine, will get u/a BP 131/90 mmHg  Pulse 70  Temp(Src) 97.8 F (36.6 C) (Oral)  Resp 12  SpO2 100%   Urinalysis negative Pt requesting d/c home Stable for d/c home BP 126/83 mmHg  Pulse 73  Temp(Src) 97.8 F (36.6 C) (Oral)  Resp 14  SpO2 99%\  Labs Review Labs Reviewed  CBC WITH DIFFERENTIAL/PLATELET - Abnormal; Notable for the following:    WBC 11.5 (*)    Hemoglobin 10.0 (*)    HCT 31.8 (*)    MCV 76.8 (*)    MCH 24.2 (*)    RDW 16.0 (*)    Lymphs Abs 4.3 (*)    All other components within normal limits  COMPREHENSIVE METABOLIC PANEL - Abnormal; Notable for the following:    Sodium 133 (*)    Potassium 3.4 (*)    CO2 19 (*)    Glucose, Bld 100 (*)    Creatinine, Ser 1.05 (*)    Calcium 8.3 (*)    Total Protein 6.2 (*)    Albumin 3.1 (*)    GFR calc non Af Amer 58 (*)    All other components within normal limits  URINALYSIS, ROUTINE W REFLEX MICROSCOPIC (NOT AT [facility]) - Abnormal; Notable for the following:    Specific Gravity, Urine <1.005 (*)    All other components within normal limits  LIPASE, BLOOD  I-STAT TROPOININ, ED    Imaging Review Dg Chest 2 View  02/19/2016   CLINICAL DATA:  c/o headache radiating down into her chest, pt states she possibly had a seizure at home tonight. Pt also c/o a yeast infection and now has uti symptoms with intermittent abdominal pain, hx of htn, gerd, dm EXAM: CHEST  2 VIEW COMPARISON:  01/02/2016 FINDINGS: Thoracic spine fixation. Vertebral body height loss at the T12 level is felt to be similar to the 01/25/2015 CT. Remote right rib trauma. Midline trachea. Mild cardiomegaly. Left pleural thickening, blunting the left costophrenic angle. No congestive failure. Clear lungs. IMPRESSION: No acute cardiopulmonary disease. Cardiomegaly without congestive failure. 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 Electronically Signed   By: Jeronimo Greaves M.D.   On: 02/19/2016 02:58      EKG Interpretation   Date/Time:  Saturday February 19 2016 01:34:38 EDT Ventricular Rate:  72 PR Interval:  122 QRS Duration: 86 QT Interval:  386 QTC Calculation: 422 R Axis:   60 Text Interpretation:  Sinus rhythm artifact noted Otherwise no significant  change Normal ECG Confirmed by Bebe Shaggy  MD, Dorinda Hill ([id number]) on 02/19/2016  1:45:13 AM      MDM   Final diagnoses:  Other headache syndrome  Chest pain, unspecified chest pain type      Nursing notes including past medical history and social history reviewed and considered in documentation xrays/imaging reviewed by myself and considered during evaluation Labs/vital reviewed myself and considered during evaluation Previous records reviewed and considered   I personally performed the services described in this documentation, which was scribed in my presence. The recorded information has been reviewed and is accurate.        Zadie Rhine, MD 02/19/16 [phone]

## 2016-02-19 NOTE — Discharge Instructions (Signed)
Your caregiver has diagnosed you as having chest pain that is not specific for one problem, but does not require admission.  Chest pain comes from many different causes.  °SEEK IMMEDIATE MEDICAL ATTENTION IF: °You have severe chest pain, especially if the pain is crushing or pressure-like and spreads to the arms, back, neck, or jaw, or if you have sweating, nausea (feeling sick to your stomach), or shortness of breath. THIS IS AN EMERGENCY. Don't wait to see if the pain will go away. Get medical help at once. Call 911 or 0 (operator). DO NOT drive yourself to the hospital.  °Your chest pain gets worse and does not go away with rest.  °You have an attack of chest pain lasting longer than usual, despite rest and treatment with the medications your caregiver has prescribed.  °You wake from sleep with chest pain or shortness of breath.  °You feel dizzy or faint.  °You have chest pain not typical of your usual pain for which you originally saw your caregiver. ° °You are having a headache. No specific cause was found today for your headache. It may have been a migraine or other cause of headache. Stress, anxiety, fatigue, and depression are common triggers for headaches. Your headache today does not appear to be life-threatening or require hospitalization, but often the exact cause of headaches is not determined in the emergency department. Therefore, follow-up with your doctor is very important to find out what may have caused your headache, and whether or not you need any further diagnostic testing or treatment. Sometimes headaches can appear benign (not harmful), but then more serious symptoms can develop which should prompt an immediate re-evaluation by your doctor or the emergency department. ° °SEEK MEDICAL ATTENTION IF: ° °You develop possible problems with medications prescribed.  °The medications don't resolve your headache, if it recurs , or if you have multiple episodes of vomiting or can't take fluids. °You  have a change from the usual headache. ° °RETURN IMMEDIATELY IF you develop a sudden, severe headache or confusion, become poorly responsive or faint, develop a fever above 100.4F or problem breathing, have a change in speech, vision, swallowing, or understanding, or develop new weakness, numbness, tingling, incoordination, or have a seizure. ° °

## 2016-02-28 ENCOUNTER — Encounter (HOSPITAL_BASED_OUTPATIENT_CLINIC_OR_DEPARTMENT_OTHER): Payer: Self-pay | Admitting: Emergency Medicine

## 2016-02-28 ENCOUNTER — Emergency Department (HOSPITAL_BASED_OUTPATIENT_CLINIC_OR_DEPARTMENT_OTHER): Payer: Medicaid Other

## 2016-02-28 ENCOUNTER — Inpatient Hospital Stay (HOSPITAL_BASED_OUTPATIENT_CLINIC_OR_DEPARTMENT_OTHER)
Admission: EM | Admit: 2016-02-28 | Discharge: 2016-03-01 | DRG: 178 | Disposition: A | Discharge status: Home / Self Care | Payer: Medicaid Other | Attending: Internal Medicine | Admitting: Internal Medicine

## 2016-02-28 DIAGNOSIS — R509 Fever, unspecified: Secondary | ICD-10-CM | POA: Diagnosis present

## 2016-02-28 DIAGNOSIS — I119 Hypertensive heart disease without heart failure: Secondary | ICD-10-CM | POA: Diagnosis present

## 2016-02-28 DIAGNOSIS — R296 Repeated falls: Secondary | ICD-10-CM | POA: Diagnosis present

## 2016-02-28 DIAGNOSIS — M25532 Pain in left wrist: Secondary | ICD-10-CM | POA: Diagnosis present

## 2016-02-28 DIAGNOSIS — Z79899 Other long term (current) drug therapy: Secondary | ICD-10-CM

## 2016-02-28 DIAGNOSIS — G8929 Other chronic pain: Secondary | ICD-10-CM | POA: Diagnosis present

## 2016-02-28 DIAGNOSIS — K219 Gastro-esophageal reflux disease without esophagitis: Secondary | ICD-10-CM | POA: Diagnosis present

## 2016-02-28 DIAGNOSIS — E119 Type 2 diabetes mellitus without complications: Secondary | ICD-10-CM | POA: Diagnosis present

## 2016-02-28 DIAGNOSIS — E872 Acidosis, unspecified: Secondary | ICD-10-CM

## 2016-02-28 DIAGNOSIS — Z79891 Long term (current) use of opiate analgesic: Secondary | ICD-10-CM

## 2016-02-28 DIAGNOSIS — Z7982 Long term (current) use of aspirin: Secondary | ICD-10-CM

## 2016-02-28 DIAGNOSIS — Z7952 Long term (current) use of systemic steroids: Secondary | ICD-10-CM

## 2016-02-28 DIAGNOSIS — I10 Essential (primary) hypertension: Secondary | ICD-10-CM | POA: Diagnosis present

## 2016-02-28 DIAGNOSIS — Z87898 Personal history of other specified conditions: Secondary | ICD-10-CM

## 2016-02-28 DIAGNOSIS — R569 Unspecified convulsions: Secondary | ICD-10-CM | POA: Diagnosis present

## 2016-02-28 DIAGNOSIS — Z91041 Radiographic dye allergy status: Secondary | ICD-10-CM

## 2016-02-28 DIAGNOSIS — Z9181 History of falling: Secondary | ICD-10-CM

## 2016-02-28 DIAGNOSIS — W19XXXA Unspecified fall, initial encounter: Secondary | ICD-10-CM | POA: Diagnosis present

## 2016-02-28 DIAGNOSIS — J690 Pneumonitis due to inhalation of food and vomit: Principal | ICD-10-CM | POA: Diagnosis present

## 2016-02-28 DIAGNOSIS — Z87891 Personal history of nicotine dependence: Secondary | ICD-10-CM

## 2016-02-28 DIAGNOSIS — R000 Tachycardia, unspecified: Secondary | ICD-10-CM

## 2016-02-28 DIAGNOSIS — A419 Sepsis, unspecified organism: Secondary | ICD-10-CM | POA: Diagnosis present

## 2016-02-28 HISTORY — DX: Repeated falls: R29.6

## 2016-02-28 LAB — I-STAT ARTERIAL BLOOD GAS, ED
ACID-BASE DEFICIT: 8 mmol/L — AB (ref 0.0–2.0)
BICARBONATE: 18 meq/L — AB (ref 20.0–24.0)
O2 Saturation: 90 %
PCO2 ART: 39.7 mmHg (ref 35.0–45.0)
PO2 ART: 72 mmHg — AB (ref 80.0–100.0)
Patient temperature: 101.8
TCO2: 19 mmol/L (ref 0–100)
pH, Arterial: 7.272 — ABNORMAL LOW (ref 7.350–7.450)

## 2016-02-28 LAB — COMPREHENSIVE METABOLIC PANEL
ALT: 16 U/L (ref 14–54)
ANION GAP: 12 (ref 5–15)
AST: 27 U/L (ref 15–41)
Albumin: 3.5 g/dL (ref 3.5–5.0)
Alkaline Phosphatase: 118 U/L (ref 38–126)
BILIRUBIN TOTAL: 1.8 mg/dL — AB (ref 0.3–1.2)
BUN: 16 mg/dL (ref 6–20)
CALCIUM: 8.8 mg/dL — AB (ref 8.9–10.3)
CO2: 20 mmol/L — ABNORMAL LOW (ref 22–32)
Chloride: 105 mmol/L (ref 101–111)
Creatinine, Ser: 1.18 mg/dL — ABNORMAL HIGH (ref 0.44–1.00)
GFR, EST AFRICAN AMERICAN: 58 mL/min — AB (ref >60)
GFR, EST NON AFRICAN AMERICAN: 50 mL/min — AB (ref >60)
Glucose, Bld: 99 mg/dL (ref 65–99)
Potassium: 3.7 mmol/L (ref 3.5–5.1)
SODIUM: 137 mmol/L (ref 135–145)
TOTAL PROTEIN: 7.2 g/dL (ref 6.5–8.1)

## 2016-02-28 LAB — CBC WITH DIFFERENTIAL/PLATELET
BASOS ABS: 0 10*3/uL (ref 0.0–0.1)
Basophils Relative: 0 %
Eosinophils Absolute: 0 10*3/uL (ref 0.0–0.7)
Eosinophils Relative: 0 %
HEMATOCRIT: 32.6 % — AB (ref 36.0–46.0)
HEMOGLOBIN: 10.5 g/dL — AB (ref 12.0–15.0)
Lymphocytes Relative: 12 %
Lymphs Abs: 1.7 10*3/uL (ref 0.7–4.0)
MCH: 24.9 pg — ABNORMAL LOW (ref 26.0–34.0)
MCHC: 32.2 g/dL (ref 30.0–36.0)
MCV: 77.3 fL — ABNORMAL LOW (ref 78.0–100.0)
Monocytes Absolute: 1.6 10*3/uL — ABNORMAL HIGH (ref 0.1–1.0)
Monocytes Relative: 11 %
NEUTROS ABS: 10.7 10*3/uL — AB (ref 1.7–7.7)
NEUTROS PCT: 77 %
Platelets: 220 10*3/uL (ref 150–400)
RBC: 4.22 MIL/uL (ref 3.87–5.11)
RDW: 15.9 % — ABNORMAL HIGH (ref 11.5–15.5)
WBC: 14 10*3/uL — AB (ref 4.0–10.5)

## 2016-02-28 LAB — URINE MICROSCOPIC-ADD ON: RBC / HPF: NONE SEEN RBC/hpf (ref 0–5)

## 2016-02-28 LAB — URINALYSIS, ROUTINE W REFLEX MICROSCOPIC
Glucose, UA: NEGATIVE mg/dL
Hgb urine dipstick: NEGATIVE
Ketones, ur: 15 mg/dL — AB
NITRITE: NEGATIVE
PH: 6 (ref 5.0–8.0)
Protein, ur: 100 mg/dL — AB
SPECIFIC GRAVITY, URINE: 1.025 (ref 1.005–1.030)

## 2016-02-28 LAB — I-STAT CG4 LACTIC ACID, ED: Lactic Acid, Venous: 0.64 mmol/L (ref 0.5–2.0)

## 2016-02-28 MED ORDER — VANCOMYCIN HCL IN DEXTROSE 1-5 GM/200ML-% IV SOLN
1000.0000 mg | Freq: Once | INTRAVENOUS | Status: AC
  Administered 2016-02-28: 1000 mg via INTRAVENOUS
  Filled 2016-02-28: qty 200

## 2016-02-28 MED ORDER — SODIUM CHLORIDE 0.9 % IV BOLUS (SEPSIS)
1000.0000 mL | INTRAVENOUS | Status: AC
  Administered 2016-02-28: 1000 mL via INTRAVENOUS

## 2016-02-28 MED ORDER — PIPERACILLIN-TAZOBACTAM 3.375 G IVPB 30 MIN
3.3750 g | Freq: Once | INTRAVENOUS | Status: AC
  Administered 2016-02-28: 3.375 g via INTRAVENOUS
  Filled 2016-02-28 (×2): qty 50

## 2016-02-28 NOTE — ED Notes (Signed)
MD at bedside. 

## 2016-02-28 NOTE — ED Provider Notes (Signed)
CSN: [id number]     Arrival date & time 02/28/16  1636 History  By signing my name below, I, Linus Galas, attest that this documentation has been prepared under the direction and in the presence of Linwood Dibbles, MD. Electronically Signed: Linus Galas, ED Scribe. 02/28/2016. 10:00 PM.   Chief Complaint  Patient presents with  . Fall   The history is provided by the patient. The history is limited by the condition of the patient. No language interpreter was used.   HPI Comments: Amber [name] is a 60 y.o. female who presents to the Emergency Department with a PMHx of HTN, DM and frequent falls complaining of left wrist pain s/p fall. Pt also repots myalgias. She states she "hurts all over". Pt denies any fevers, chills, abdominal pain, CP, or any other symptoms at this time.   Past Medical History  Diagnosis Date  . Arthritis   . Asthma   . Hypertension   . Constipation   . GERD (gastroesophageal reflux disease)   . Diabetes mellitus without complication (HCC)   . Chronic pain   . Seizures (HCC)     last seizure March 2015  . Pneumonia 10/2015  . Falls frequently    Past Surgical History  Procedure Laterality Date  . Back surgery    . Cholecystectomy    . Appendectomy     Family History  Problem Relation Age of Onset  . Kidney failure Mother   . Hypertension Sister   . Hypertension Brother    Social History  Substance Use Topics  . Smoking status: Former Games developer  . Smokeless tobacco: Never Used  . Alcohol Use: No     Comment: admits to 2 drinks/week   OB History    No data available     Review of Systems A complete 10 system review of systems was obtained and all systems are negative except as noted in the HPI and PMH.   Allergies  Ivp dye  Home Medications   Prior to Admission medications   Medication Sig Start Date End Date Taking? Authorizing Provider  albuterol (PROVENTIL HFA;VENTOLIN HFA) 108 (90 BASE) MCG/ACT inhaler Inhale 2 puffs into the lungs  every 6 (six) hours as needed for wheezing or shortness of breath.    Historical Provider, MD  amLODipine (NORVASC) 5 MG tablet Take 5 mg by mouth daily.    Historical Provider, MD  aspirin EC 81 MG tablet Take 1 tablet (81 mg total) by mouth daily. 06/14/15   Richarda Overlie, MD  atorvastatin (LIPITOR) 10 MG tablet Take 10 mg by mouth daily. 08/13/15   Historical Provider, MD  cholecalciferol (VITAMIN D) 1000 units tablet Take 2,000 Units by mouth daily.    Historical Provider, MD  cyclobenzaprine (FLEXERIL) 10 MG tablet Take 1 tablet (10 mg total) by mouth 2 (two) times daily as needed for muscle spasms. 01/31/16   Arthor Captain, PA-C  dextromethorphan-guaiFENesin (MUCINEX DM) 30-600 MG 12hr tablet Take 1 tablet by mouth 2 (two) times daily. 01/02/16   Vanetta Mulders, MD  diazepam (VALIUM) 5 MG tablet Take 1 tablet (5 mg total) by mouth every 12 (twelve) hours as needed for muscle spasms. 08/25/15   Rodolph Bong, MD  feeding supplement, ENSURE ENLIVE, (ENSURE ENLIVE) LIQD Take 237 mLs by mouth 2 (two) times daily between meals. 11/10/15   Catarina Hartshorn, MD  Fluticasone-Salmeterol (ADVAIR) 250-50 MCG/DOSE AEPB Inhale 1 puff into the lungs 2 (two) times daily.    Historical Provider, MD  folic  acid (FOLVITE) 1 MG tablet Take 1 mg by mouth daily.    Historical Provider, MD  furosemide (LASIX) 40 MG tablet Take 1 tablet (40 mg total) by mouth daily. 06/14/15   Richarda Overlie, MD  gabapentin (NEURONTIN) 300 MG capsule Take 1 capsule (300 mg total) by mouth 2 (two) times daily. 06/14/15   Richarda Overlie, MD  LINZESS 145 MCG CAPS capsule Take 145 mcg by mouth daily as needed (constipation).  08/13/15   Historical Provider, MD  metFORMIN (GLUCOPHAGE-XR) 500 MG 24 hr tablet Take 500 mg by mouth daily with breakfast.    Historical Provider, MD  metoprolol succinate (TOPROL-XL) 25 MG 24 hr tablet Take 25 mg by mouth daily.    Historical Provider, MD  naproxen sodium (ANAPROX) 275 MG tablet Take 1 tablet (275 mg total) by  mouth 2 (two) times daily with a meal. 01/31/16   Arthor Captain, PA-C  Oxycodone HCl 20 MG TABS Take 1 tablet (20 mg total) by mouth every 4 (four) hours. Refill by primary MD 12/25/15   Ripudeep Jenna Luo, MD  oxymorphone (OPANA ER) 40 MG 12 hr tablet Take 1 tablet (40 mg total) by mouth every 12 (twelve) hours. Refill by primary MD 12/25/15   Ripudeep Jenna Luo, MD  pantoprazole (PROTONIX) 40 MG tablet Take 1 tablet (40 mg total) by mouth 2 (two) times daily before a meal. 12/25/15   Ripudeep K Rai, MD  polyethylene glycol (MIRALAX / GLYCOLAX) packet Take 17 g by mouth 2 (two) times daily. 06/14/15   Richarda Overlie, MD  potassium chloride SA (K-DUR,KLOR-CON) 20 MEQ tablet Take 2 tablets (40 mEq total) by mouth daily. 01/27/15   Calvert Cantor, MD  predniSONE (DELTASONE) 10 MG tablet Take 4 tablets (40 mg total) by mouth daily. 01/02/16   Vanetta Mulders, MD  predniSONE (DELTASONE) 5 MG tablet Take 15 mg by mouth daily. 12/14/15   Historical Provider, MD  promethazine (PHENERGAN) 25 MG tablet Take 1 tablet by mouth every 6 (six) hours as needed for nausea or vomiting. nausea 10/06/15   Historical Provider, MD  tiZANidine (ZANAFLEX) 4 MG tablet Take 4 mg by mouth 4 (four) times daily. 08/13/15   Historical Provider, MD  traMADol (ULTRAM) 50 MG tablet Take 1 tablet (50 mg total) by mouth every 8 (eight) hours as needed for moderate pain. For arthritis pain 12/25/15 12/24/16  Ripudeep Jenna Luo, MD  Vitamin D, Ergocalciferol, (DRISDOL) 50000 UNITS CAPS capsule Take 1 capsule (50,000 Units total) by mouth every 7 (seven) days. Sunday Patient taking differently: Take 50,000 Units by mouth every 7 (seven) days.  01/27/15   Calvert Cantor, MD  VITAMIN E PO Take 1 capsule by mouth daily.    Historical Provider, MD   BP 110/86 mmHg  Pulse 120  Temp(Src) 101.3 F (38.5 C) (Rectal)  Resp 20  SpO2 95% Physical Exam  Constitutional: She appears listless.  Chronically ill-appearing  HENT:  Head: Normocephalic and atraumatic.  Right Ear:  External ear normal.  Left Ear: External ear normal.  Eyes: Conjunctivae are normal. Right eye exhibits no discharge. Left eye exhibits no discharge. No scleral icterus.  Neck: Neck supple. No tracheal deviation present.  Cardiovascular: Regular rhythm and intact distal pulses.  Tachycardia present.   Pulmonary/Chest: Effort normal and breath sounds normal. No stridor. No respiratory distress. She has no wheezes. She has no rales.  Abdominal: Soft. Bowel sounds are normal. She exhibits no distension. There is no tenderness. There is no rebound and no guarding.  Musculoskeletal: She exhibits no edema.       Left wrist: She exhibits tenderness, bony tenderness, swelling and deformity.       Cervical back: She exhibits no tenderness and no swelling.       Thoracic back: She exhibits no tenderness and no swelling.       Lumbar back: She exhibits no tenderness and no swelling.  Patient is also diffusely tender throughout her body, no focal areas of erythema or edema, no focal bony tenderness exception of her left wrist  Neurological: She appears listless. She is not disoriented. No cranial nerve deficit (no facial droop, extraocular movements intact, no slurred speech) or sensory deficit. She exhibits normal muscle tone. She displays no seizure activity. GCS eye subscore is 4. GCS verbal subscore is 5. GCS motor subscore is 6.  Generalized weakness   Skin: Skin is warm and dry. No rash noted.  Psychiatric: She has a normal mood and affect.  Nursing note and vitals reviewed.   ED Course  Procedures   DIAGNOSTIC STUDIES: Oxygen Saturation is 95% on room air, normal by my interpretation.    COORDINATION OF CARE: 5:52 PM Discussed treatment plan with pt at bedside and pt agreed to plan.  Labs Review Labs Reviewed  COMPREHENSIVE METABOLIC PANEL - Abnormal; Notable for the following:    CO2 20 (*)    Creatinine, Ser 1.18 (*)    Calcium 8.8 (*)    Total Bilirubin 1.8 (*)    GFR calc non Af  Amer 50 (*)    GFR calc Af Amer 58 (*)    All other components within normal limits  CBC WITH DIFFERENTIAL/PLATELET - Abnormal; Notable for the following:    WBC 14.0 (*)    Hemoglobin 10.5 (*)    HCT 32.6 (*)    MCV 77.3 (*)    MCH 24.9 (*)    RDW 15.9 (*)    Neutro Abs 10.7 (*)    Monocytes Absolute 1.6 (*)    All other components within normal limits  URINALYSIS, ROUTINE W REFLEX MICROSCOPIC (NOT AT [facility]) - Abnormal; Notable for the following:    Color, Urine AMBER (*)    Bilirubin Urine MODERATE (*)    Ketones, ur 15 (*)    Protein, ur 100 (*)    Leukocytes, UA TRACE (*)    All other components within normal limits  URINE MICROSCOPIC-ADD ON - Abnormal; Notable for the following:    Squamous Epithelial / LPF 0-5 (*)    Bacteria, UA FEW (*)    Casts HYALINE CASTS (*)    All other components within normal limits  I-STAT ARTERIAL BLOOD GAS, ED - Abnormal; Notable for the following:    pH, Arterial 7.272 (*)    pO2, Arterial 72.0 (*)    Bicarbonate 18.0 (*)    Acid-base deficit 8.0 (*)    All other components within normal limits  CULTURE, BLOOD (ROUTINE X 2)  CULTURE, BLOOD (ROUTINE X 2)  URINE CULTURE  I-STAT CG4 LACTIC ACID, ED  I-STAT CG4 LACTIC ACID, ED    Imaging Review Dg Chest 2 View  02/28/2016  CLINICAL DATA:  Pain following fall EXAM: CHEST  2 VIEW COMPARISON:  February 19, 2016 FINDINGS: Lungs are clear. Heart is upper normal in size with pulmonary vascularity within normal limits. There is extensive postoperative change throughout the thoracic spine, stable. There old healed rib fractures on the right, stable. No adenopathy evident. No pneumothorax. IMPRESSION: Extensive postoperative change throughout much  of the thoracic spine. Old rib trauma on the right. No edema or consolidation. Stable cardiac silhouette. Electronically Signed   By: Bretta Bang III M.D.   On: 02/28/2016 21:05   Dg Wrist Complete Left  02/28/2016  CLINICAL DATA:  Fall with left wrist  injury and pain. Initial encounter. EXAM: LEFT WRIST - COMPLETE 3+ VIEW COMPARISON:  None. FINDINGS: No acute fracture or dislocation identified. There are advanced degenerative changes involving the carpal bones, radiocarpal joint and multiple CMC joints. Distal ulnar and radial arteries show calcifications, likely reflecting diabetic arterial calcifications. No bony lesions or erosions. Soft tissues are unremarkable. IMPRESSION: No acute fracture identified. Electronically Signed   By: Irish Lack M.D.   On: 02/28/2016 21:04   Ct Head Wo Contrast  02/28/2016  CLINICAL DATA:  Pain following fall 1 day prior.  Lethargy. EXAM: CT HEAD WITHOUT CONTRAST TECHNIQUE: Contiguous axial images were obtained from the base of the skull through the vertex without intravenous contrast. COMPARISON:  July 26, 2015 FINDINGS: The ventricles are normal in size and configuration. There is no intracranial mass, hemorrhage, extra-axial fluid collection, or midline shift. Patchy small vessel disease in the centra semiovale bilaterally is stable. There is no new gray-white compartment lesion. No acute infarct is evident on this study. Bony calvarium appears intact. The mastoid air cells are clear. There is mucosal thickening in each maxillary antrum. No intraorbital lesions are evident. IMPRESSION: Stable patchy periventricular small vessel disease. No intracranial mass, hemorrhage, or extra-axial fluid collection. No acute appearing infarct. Mild maxillary sinus disease bilaterally. Electronically Signed   By: Bretta Bang III M.D.   On: 02/28/2016 21:15   I have personally reviewed and evaluated these images and lab results as part of my medical decision-making.   EKG Interpretation   Date/Time:  Monday February 28 2016 19:14:39 EDT Ventricular Rate:  132 PR Interval:  114 QRS Duration: 68 QT Interval:  288 QTC Calculation: 427 R Axis:   54 Text Interpretation:  Sinus tachycardia Borderline repolarization   abnormality Since last tracing rate faster Confirmed by Kshawn Canal  MD-J, Eleaner Dibartolo  ([id number]) on 02/28/2016 7:26:25 PM      MDM   Final diagnoses:  Fever, unspecified fever cause  Metabolic acidosis  Tachycardia   Pt presented to the ED with fever and lethargy.  Pt has a history of chronic pain and takes oxycodone and opana.  A pill was found with her when the nurse was getting her in the gown.  Pt did spike a fever in the ED and has had a persistent tachycardia.  No obvious sign of infection right now.  Pt has wrist ttp but there is no erythema and she appears to have chronic joint issues.  No neck stiffness, doubt meningitis.  Does have a metabolic acidosis on abg but lactic acid level is reassuring.  Still concerned about the possibility of bacteremia.  Will start empiric abx.  Consult for transfer and admission to the hospital.  I personally performed the services described in this documentation, which was scribed in my presence.  The recorded information has been reviewed and is accurate.     Linwood Dibbles, MD 02/28/16 2203

## 2016-02-28 NOTE — Progress Notes (Addendum)
Received info from Dr. Lynelle Doctor regarding [facility] transfer Ms. Pence is a 60 year old female with past medical history significant for HTN, DM, seizures, and asthma; who presented with complaints of repeated falls with left wrist pain. Initially heart rates up to the 130s(sinus tachycardia on EKG), temperature of 101.74F, and blood pressure stable 123/86. Labs revealed WBC of 14, lactic acid 0.64. Patient seen have a metabolic acidosis with bicarbonate of 20. X-rays of the chest and left wrist showed no acute signs of infection or injury respectively. UA positive for few bacteria, trace leukocytes, 0-5 squamous epithelial cells, and 0-5 WBCs. Patient placed on broad-spectrum antibiotics for possible sepsis of unknown origin. Admitted to a telemetry bed here at [facility].

## 2016-02-28 NOTE — ED Notes (Addendum)
Pt having generalized weakness at home.  Fell yesterday at home and injured left wrist, left forearm.  No LOC.  No head injury.  Pt is lethargic, moaning, appears sedated.

## 2016-02-28 NOTE — ED Notes (Signed)
Pt. Granddaughter Amber [name] phone [phone]    Pt. Granddaughter was here with pt. And left.  Pt. granddaughter reports the Pt. Takes her own medicines at home Pt. Granddaughter feels the pt. Has over taken her pain meds and that is why she called 911 today.  She reported to RN that she is now leaving and wants RN to call if we need her for anything.

## 2016-02-29 ENCOUNTER — Encounter (HOSPITAL_COMMUNITY): Payer: Self-pay | Admitting: General Practice

## 2016-02-29 DIAGNOSIS — K219 Gastro-esophageal reflux disease without esophagitis: Secondary | ICD-10-CM | POA: Diagnosis present

## 2016-02-29 DIAGNOSIS — Z87891 Personal history of nicotine dependence: Secondary | ICD-10-CM | POA: Diagnosis not present

## 2016-02-29 DIAGNOSIS — I10 Essential (primary) hypertension: Secondary | ICD-10-CM

## 2016-02-29 DIAGNOSIS — A419 Sepsis, unspecified organism: Secondary | ICD-10-CM | POA: Diagnosis not present

## 2016-02-29 DIAGNOSIS — G8929 Other chronic pain: Secondary | ICD-10-CM | POA: Diagnosis present

## 2016-02-29 DIAGNOSIS — Z79891 Long term (current) use of opiate analgesic: Secondary | ICD-10-CM | POA: Diagnosis not present

## 2016-02-29 DIAGNOSIS — E1121 Type 2 diabetes mellitus with diabetic nephropathy: Secondary | ICD-10-CM | POA: Diagnosis not present

## 2016-02-29 DIAGNOSIS — R569 Unspecified convulsions: Secondary | ICD-10-CM | POA: Diagnosis present

## 2016-02-29 DIAGNOSIS — W19XXXA Unspecified fall, initial encounter: Secondary | ICD-10-CM | POA: Diagnosis present

## 2016-02-29 DIAGNOSIS — E872 Acidosis: Secondary | ICD-10-CM | POA: Diagnosis present

## 2016-02-29 DIAGNOSIS — E119 Type 2 diabetes mellitus without complications: Secondary | ICD-10-CM | POA: Diagnosis present

## 2016-02-29 DIAGNOSIS — R509 Fever, unspecified: Secondary | ICD-10-CM | POA: Diagnosis not present

## 2016-02-29 DIAGNOSIS — Z91041 Radiographic dye allergy status: Secondary | ICD-10-CM | POA: Diagnosis not present

## 2016-02-29 DIAGNOSIS — M25532 Pain in left wrist: Secondary | ICD-10-CM | POA: Diagnosis present

## 2016-02-29 DIAGNOSIS — Z7952 Long term (current) use of systemic steroids: Secondary | ICD-10-CM | POA: Diagnosis not present

## 2016-02-29 DIAGNOSIS — R296 Repeated falls: Secondary | ICD-10-CM | POA: Diagnosis present

## 2016-02-29 DIAGNOSIS — Z7982 Long term (current) use of aspirin: Secondary | ICD-10-CM | POA: Diagnosis not present

## 2016-02-29 DIAGNOSIS — E118 Type 2 diabetes mellitus with unspecified complications: Secondary | ICD-10-CM | POA: Diagnosis not present

## 2016-02-29 DIAGNOSIS — Z9181 History of falling: Secondary | ICD-10-CM | POA: Diagnosis not present

## 2016-02-29 DIAGNOSIS — Z79899 Other long term (current) drug therapy: Secondary | ICD-10-CM | POA: Diagnosis not present

## 2016-02-29 DIAGNOSIS — Z8669 Personal history of other diseases of the nervous system and sense organs: Secondary | ICD-10-CM

## 2016-02-29 DIAGNOSIS — J690 Pneumonitis due to inhalation of food and vomit: Secondary | ICD-10-CM | POA: Diagnosis present

## 2016-02-29 LAB — CBC
HEMATOCRIT: 30 % — AB (ref 36.0–46.0)
HEMOGLOBIN: 9.1 g/dL — AB (ref 12.0–15.0)
MCH: 24.1 pg — ABNORMAL LOW (ref 26.0–34.0)
MCHC: 30.3 g/dL (ref 30.0–36.0)
MCV: 79.4 fL (ref 78.0–100.0)
Platelets: 190 10*3/uL (ref 150–400)
RBC: 3.78 MIL/uL — ABNORMAL LOW (ref 3.87–5.11)
RDW: 15.9 % — ABNORMAL HIGH (ref 11.5–15.5)
WBC: 13.3 10*3/uL — ABNORMAL HIGH (ref 4.0–10.5)

## 2016-02-29 LAB — GLUCOSE, CAPILLARY
GLUCOSE-CAPILLARY: 221 mg/dL — AB (ref 65–99)
Glucose-Capillary: 226 mg/dL — ABNORMAL HIGH (ref 65–99)
Glucose-Capillary: 278 mg/dL — ABNORMAL HIGH (ref 65–99)

## 2016-02-29 LAB — RAPID URINE DRUG SCREEN, HOSP PERFORMED
AMPHETAMINES: NOT DETECTED
BARBITURATES: NOT DETECTED
BENZODIAZEPINES: POSITIVE — AB
Cocaine: NOT DETECTED
Opiates: POSITIVE — AB
Tetrahydrocannabinol: NOT DETECTED

## 2016-02-29 LAB — BLOOD GAS, ARTERIAL
Acid-base deficit: 3.2 mmol/L — ABNORMAL HIGH (ref 0.0–2.0)
Bicarbonate: 21.3 mEq/L (ref 20.0–24.0)
Drawn by: 441371
O2 Content: 2 L/min
O2 Saturation: 93.6 %
PCO2 ART: 38.5 mmHg (ref 35.0–45.0)
PO2 ART: 78.1 mmHg — AB (ref 80.0–100.0)
Patient temperature: 99.3
TCO2: 22.4 mmol/L (ref 0–100)
pH, Arterial: 7.363 (ref 7.350–7.450)

## 2016-02-29 LAB — BASIC METABOLIC PANEL
Anion gap: 15 (ref 5–15)
BUN: 14 mg/dL (ref 6–20)
CHLORIDE: 104 mmol/L (ref 101–111)
CO2: 16 mmol/L — AB (ref 22–32)
CREATININE: 1.13 mg/dL — AB (ref 0.44–1.00)
Calcium: 8 mg/dL — ABNORMAL LOW (ref 8.9–10.3)
GFR calc non Af Amer: 53 mL/min — ABNORMAL LOW (ref >60)
Glucose, Bld: 176 mg/dL — ABNORMAL HIGH (ref 65–99)
Potassium: 4.3 mmol/L (ref 3.5–5.1)
Sodium: 135 mmol/L (ref 135–145)

## 2016-02-29 LAB — SALICYLATE LEVEL: Salicylate Lvl: <4 mg/dL (ref 2.8–30.0)

## 2016-02-29 LAB — LACTIC ACID, PLASMA: Lactic Acid, Venous: 1.3 mmol/L (ref 0.5–2.0)

## 2016-02-29 LAB — AMMONIA: AMMONIA: 21 umol/L (ref 9–35)

## 2016-02-29 LAB — URIC ACID: Uric Acid, Serum: 4 mg/dL (ref 2.3–6.6)

## 2016-02-29 MED ORDER — MORPHINE SULFATE ER 100 MG PO TBCR: 100.0000 mg | EXTENDED_RELEASE_TABLET | Freq: Two times a day (BID) | ORAL | Status: DC

## 2016-02-29 MED ORDER — STERILE WATER FOR INJECTION IV SOLN
INTRAVENOUS | Status: DC
  Administered 2016-02-29: 12:00:00 via INTRAVENOUS
  Filled 2016-02-29 (×3): qty 850

## 2016-02-29 MED ORDER — OXYMORPHONE HCL ER 10 MG PO T12A
10.0000 mg | EXTENDED_RELEASE_TABLET | Freq: Two times a day (BID) | ORAL | Status: DC
  Administered 2016-02-29: 10 mg via ORAL
  Filled 2016-02-29: qty 1

## 2016-02-29 MED ORDER — ENSURE ENLIVE PO LIQD
237.0000 mL | Freq: Two times a day (BID) | ORAL | Status: DC
  Administered 2016-02-29 – 2016-03-01 (×3): 237 mL via ORAL

## 2016-02-29 MED ORDER — DIAZEPAM 2 MG PO TABS
2.0000 mg | ORAL_TABLET | Freq: Two times a day (BID) | ORAL | Status: DC | PRN
  Administered 2016-02-29: 2 mg via ORAL
  Filled 2016-02-29: qty 1

## 2016-02-29 MED ORDER — ALBUTEROL SULFATE (2.5 MG/3ML) 0.083% IN NEBU: 2.5000 mg | INHALATION_SOLUTION | RESPIRATORY_TRACT | Status: DC | PRN

## 2016-02-29 MED ORDER — INSULIN ASPART 100 UNIT/ML ~~LOC~~ SOLN
0.0000 [IU] | Freq: Every day | SUBCUTANEOUS | Status: DC
  Administered 2016-02-29: 2 [IU] via SUBCUTANEOUS

## 2016-02-29 MED ORDER — AMLODIPINE BESYLATE 5 MG PO TABS
5.0000 mg | ORAL_TABLET | Freq: Every day | ORAL | Status: DC
  Administered 2016-02-29 – 2016-03-01 (×2): 5 mg via ORAL
  Filled 2016-02-29 (×2): qty 1

## 2016-02-29 MED ORDER — ENOXAPARIN SODIUM 40 MG/0.4ML ~~LOC~~ SOLN
40.0000 mg | SUBCUTANEOUS | Status: DC
  Administered 2016-02-29: 40 mg via SUBCUTANEOUS
  Filled 2016-02-29: qty 0.4

## 2016-02-29 MED ORDER — INSULIN ASPART 100 UNIT/ML ~~LOC~~ SOLN
0.0000 [IU] | Freq: Three times a day (TID) | SUBCUTANEOUS | Status: DC
  Administered 2016-02-29: 3 [IU] via SUBCUTANEOUS
  Administered 2016-02-29: 5 [IU] via SUBCUTANEOUS
  Administered 2016-03-01 (×2): 3 [IU] via SUBCUTANEOUS

## 2016-02-29 MED ORDER — ATORVASTATIN CALCIUM 10 MG PO TABS
10.0000 mg | ORAL_TABLET | Freq: Every day | ORAL | Status: DC
  Administered 2016-02-29 – 2016-03-01 (×2): 10 mg via ORAL
  Filled 2016-02-29 (×2): qty 1

## 2016-02-29 MED ORDER — MORPHINE SULFATE ER 30 MG PO TBCR: 30.0000 mg | EXTENDED_RELEASE_TABLET | Freq: Two times a day (BID) | ORAL | Status: DC

## 2016-02-29 MED ORDER — PIPERACILLIN-TAZOBACTAM 3.375 G IVPB
3.3750 g | Freq: Three times a day (TID) | INTRAVENOUS | Status: DC
  Administered 2016-02-29 – 2016-03-01 (×4): 3.375 g via INTRAVENOUS
  Filled 2016-02-29 (×8): qty 50

## 2016-02-29 MED ORDER — OXYMORPHONE HCL ER 10 MG PO T12A
20.0000 mg | EXTENDED_RELEASE_TABLET | Freq: Two times a day (BID) | ORAL | Status: DC
  Administered 2016-02-29 – 2016-03-01 (×2): 20 mg via ORAL
  Filled 2016-02-29 (×2): qty 2

## 2016-02-29 MED ORDER — OXYCODONE HCL 5 MG PO TABS
10.0000 mg | ORAL_TABLET | ORAL | Status: DC | PRN
  Administered 2016-02-29 (×2): 10 mg via ORAL
  Filled 2016-02-29 (×2): qty 2

## 2016-02-29 MED ORDER — VANCOMYCIN HCL 500 MG IV SOLR
500.0000 mg | Freq: Two times a day (BID) | INTRAVENOUS | Status: DC
  Administered 2016-02-29 – 2016-03-01 (×3): 500 mg via INTRAVENOUS
  Filled 2016-02-29 (×4): qty 500

## 2016-02-29 MED ORDER — PREDNISONE 5 MG PO TABS: 15.0000 mg | ORAL_TABLET | Freq: Every day | ORAL | Status: DC

## 2016-02-29 MED ORDER — ACETAMINOPHEN 650 MG RE SUPP: 650.0000 mg | Freq: Four times a day (QID) | RECTAL | Status: DC | PRN

## 2016-02-29 MED ORDER — ALBUTEROL SULFATE HFA 108 (90 BASE) MCG/ACT IN AERS: 2.0000 | INHALATION_SPRAY | Freq: Four times a day (QID) | RESPIRATORY_TRACT | Status: DC | PRN

## 2016-02-29 MED ORDER — POTASSIUM CHLORIDE CRYS ER 20 MEQ PO TBCR
40.0000 meq | EXTENDED_RELEASE_TABLET | Freq: Every day | ORAL | Status: DC
  Administered 2016-02-29 – 2016-03-01 (×2): 40 meq via ORAL
  Filled 2016-02-29 (×2): qty 2

## 2016-02-29 MED ORDER — OXYMORPHONE HCL ER 10 MG PO T12A
10.0000 mg | EXTENDED_RELEASE_TABLET | Freq: Once | ORAL | Status: AC
  Administered 2016-02-29: 10 mg via ORAL
  Filled 2016-02-29: qty 1

## 2016-02-29 MED ORDER — FOLIC ACID 1 MG PO TABS
1.0000 mg | ORAL_TABLET | Freq: Every day | ORAL | Status: DC
  Administered 2016-02-29 – 2016-03-01 (×2): 1 mg via ORAL
  Filled 2016-02-29 (×2): qty 1

## 2016-02-29 MED ORDER — ACETAMINOPHEN 325 MG PO TABS: 650.0000 mg | ORAL_TABLET | Freq: Four times a day (QID) | ORAL | Status: DC | PRN

## 2016-02-29 MED ORDER — ONDANSETRON HCL 4 MG PO TABS: 4.0000 mg | ORAL_TABLET | Freq: Four times a day (QID) | ORAL | Status: DC | PRN

## 2016-02-29 MED ORDER — OXYCODONE HCL 5 MG PO TABS: 5.0000 mg | ORAL_TABLET | Freq: Four times a day (QID) | ORAL | Status: DC | PRN

## 2016-02-29 MED ORDER — IPRATROPIUM BROMIDE 0.02 % IN SOLN: 0.5000 mg | RESPIRATORY_TRACT | Status: DC | PRN

## 2016-02-29 MED ORDER — DIAZEPAM 5 MG PO TABS: 5.0000 mg | ORAL_TABLET | Freq: Two times a day (BID) | ORAL | Status: DC | PRN

## 2016-02-29 MED ORDER — GABAPENTIN 300 MG PO CAPS
300.0000 mg | ORAL_CAPSULE | Freq: Two times a day (BID) | ORAL | Status: DC
  Administered 2016-02-29: 300 mg via ORAL
  Filled 2016-02-29: qty 1

## 2016-02-29 MED ORDER — NALOXONE HCL 0.4 MG/ML IJ SOLN
0.4000 mg | INTRAMUSCULAR | Status: DC | PRN
  Filled 2016-02-29: qty 1

## 2016-02-29 MED ORDER — ASPIRIN EC 81 MG PO TBEC
81.0000 mg | DELAYED_RELEASE_TABLET | Freq: Every day | ORAL | Status: DC
  Administered 2016-02-29 – 2016-03-01 (×2): 81 mg via ORAL
  Filled 2016-02-29 (×2): qty 1

## 2016-02-29 MED ORDER — METHYLPREDNISOLONE SODIUM SUCC 40 MG IJ SOLR
40.0000 mg | Freq: Two times a day (BID) | INTRAMUSCULAR | Status: DC
  Administered 2016-02-29 (×2): 40 mg via INTRAVENOUS
  Filled 2016-02-29 (×3): qty 1

## 2016-02-29 MED ORDER — METOPROLOL SUCCINATE ER 25 MG PO TB24
25.0000 mg | ORAL_TABLET | Freq: Every day | ORAL | Status: DC
  Administered 2016-02-29 – 2016-03-01 (×2): 25 mg via ORAL
  Filled 2016-02-29 (×2): qty 1

## 2016-02-29 MED ORDER — OXYCODONE HCL 5 MG PO TABS
20.0000 mg | ORAL_TABLET | Freq: Four times a day (QID) | ORAL | Status: DC
  Administered 2016-02-29: 20 mg via ORAL
  Filled 2016-02-29: qty 4

## 2016-02-29 MED ORDER — LINACLOTIDE 145 MCG PO CAPS
145.0000 ug | ORAL_CAPSULE | Freq: Every day | ORAL | Status: DC | PRN
  Filled 2016-02-29: qty 1

## 2016-02-29 MED ORDER — SODIUM CHLORIDE 0.45 % IV SOLN: INTRAVENOUS | Status: DC

## 2016-02-29 MED ORDER — FLUTICASONE FUROATE-VILANTEROL 200-25 MCG/INH IN AEPB
1.0000 | INHALATION_SPRAY | Freq: Every day | RESPIRATORY_TRACT | Status: DC
  Administered 2016-02-29: 1 via RESPIRATORY_TRACT
  Filled 2016-02-29: qty 28

## 2016-02-29 MED ORDER — ONDANSETRON HCL 4 MG/2ML IJ SOLN: 4.0000 mg | Freq: Four times a day (QID) | INTRAMUSCULAR | Status: DC | PRN

## 2016-02-29 MED ORDER — PANTOPRAZOLE SODIUM 40 MG PO TBEC
40.0000 mg | DELAYED_RELEASE_TABLET | Freq: Two times a day (BID) | ORAL | Status: DC
  Administered 2016-02-29 – 2016-03-01 (×3): 40 mg via ORAL
  Filled 2016-02-29 (×3): qty 1

## 2016-02-29 MED ORDER — POLYETHYLENE GLYCOL 3350 17 G PO PACK
17.0000 g | PACK | Freq: Two times a day (BID) | ORAL | Status: DC
  Administered 2016-02-29: 17 g via ORAL
  Filled 2016-02-29 (×3): qty 1

## 2016-02-29 NOTE — Progress Notes (Signed)
Pharmacy Antibiotic Note  Amber [name] is a 60 y.o. female admitted on 02/28/2016 with sepsis.  Pharmacy has been consulted for Vancomycin and Zosyn dosing. Vancomycin 1gm and Zosyn 3.375gm given in ED ~2230.  Plan: Zosyn 3.375gm IV q8h - doses over 4 hours Vancomycin 500mg  IV q12h Will f/u micro data, renal function, and pt's clinical condition Vanc trough prn  Height: 5' (152.4 cm) Weight: 141 lb 4.8 oz (64.093 kg) (scale c) IBW/kg (Calculated) : 45.5  Temp (24hrs), Avg:99.6 F (37.6 C), Min:98.7 F (37.1 C), Max:101.3 F (38.5 C)   Recent Labs Lab 02/28/16 1730 02/28/16 1942  WBC 14.0*  --   CREATININE 1.18*  --   LATICACIDVEN  --  0.64    Estimated Creatinine Clearance: 43.9 mL/min (by C-G formula based on Cr of 1.18).    Allergies  Allergen Reactions  . Ivp Dye [Iodinated Diagnostic Agents] Itching    Antimicrobials this admission: 4/10 Zosyn >>  4/10 Vanc >>   Dose adjustments this admission: n/a  Microbiology results: 4/10 BCx:  4/10 UCx:    Thank you for allowing pharmacy to be a part of this patient's care.  6/10, PharmD, BCPS Clinical pharmacist, pager [phone] 02/29/2016 4:39 AM

## 2016-02-29 NOTE — H&P (Addendum)
[facility] History and Physical  Amber [name] HQI:[id number] DOB: 07/07/1956 DOA: 02/28/2016  Referring physician:Dr. Lynelle Doctor [facility] PCP: [name], MD   Chief Complaint: Falls  HPI:  Amber [name] is a 60 year old female with past medical history significant for HTN, DM, seizures, chronic pain, and asthma; who presented with complaints of repeated falls with left wrist pain. Patient reports that she had gotten up to use the restroom and had fell hitting the nightstand. She is unsure if she blacked out or had a seizure. Denies any incontinence of bowel or bladder. She complains of significant pain about the left hand with associated symptoms of swelling. She reports having increasing falls the last few weeks. She reports her last seizure was possibly over a month ago. Also complaining of four-day history of fever and chills. She is on chronic prednisone from Dr. Harrell Gave rheumatology. At baseline patient ambulates with a walker or wheelchair.  Upon admission patient was seen to have heart rates up to the 130s(sinus tachycardia on EKG), temperature of 101.29F, and blood pressure  123/86. Labs revealed WBC 14, lactic acid 0.64.  X-rays of the chest and left wrist showed no acute signs of infection or injury respectively. UA positive for few bacteria, trace leukocytes, 0-5 squamous epithelial cells, and 0-5 WBCs. Patient placed on broad-spectrum antibiotics for possible sepsis of unknown origin. Admitted to a telemetry bed here at [facility].    Past Medical History  Diagnosis Date  . Arthritis   . Asthma   . Hypertension   . Constipation   . GERD (gastroesophageal reflux disease)   . Diabetes mellitus without complication (HCC)   . Chronic pain   . Seizures (HCC)     last seizure March 2015  . Pneumonia 10/2015  . Falls frequently      Past Surgical History  Procedure Laterality Date  . Back surgery    . Cholecystectomy    . Appendectomy        Social History:  reports  that she has quit smoking. She has never used smokeless tobacco. She reports that she does not drink alcohol or use illicit drugs. Where does patient live--home  and with whom if at home?Granddaughter Can patient participate in ADLs?Yes  Allergies  Allergen Reactions  . Ivp Dye [Iodinated Diagnostic Agents] Itching    Family History  Problem Relation Age of Onset  . Kidney failure Mother   . Hypertension Sister   . Hypertension Brother         Prior to Admission medications   Medication Sig Start Date End Date Taking? Authorizing Provider  albuterol (PROVENTIL HFA;VENTOLIN HFA) 108 (90 BASE) MCG/ACT inhaler Inhale 2 puffs into the lungs every 6 (six) hours as needed for wheezing or shortness of breath.    Historical Provider, MD  amLODipine (NORVASC) 5 MG tablet Take 5 mg by mouth daily.    Historical Provider, MD  aspirin EC 81 MG tablet Take 1 tablet (81 mg total) by mouth daily. 06/14/15   Richarda Overlie, MD  atorvastatin (LIPITOR) 10 MG tablet Take 10 mg by mouth daily. 08/13/15   Historical Provider, MD  cholecalciferol (VITAMIN D) 1000 units tablet Take 2,000 Units by mouth daily.    Historical Provider, MD  cyclobenzaprine (FLEXERIL) 10 MG tablet Take 1 tablet (10 mg total) by mouth 2 (two) times daily as needed for muscle spasms. 01/31/16   Arthor Captain, PA-C  dextromethorphan-guaiFENesin (MUCINEX DM) 30-600 MG 12hr tablet Take 1 tablet by mouth 2 (two) times daily. 01/02/16  Vanetta Mulders, MD  diazepam (VALIUM) 5 MG tablet Take 1 tablet (5 mg total) by mouth every 12 (twelve) hours as needed for muscle spasms. 08/25/15   Rodolph Bong, MD  feeding supplement, ENSURE ENLIVE, (ENSURE ENLIVE) LIQD Take 237 mLs by mouth 2 (two) times daily between meals. 11/10/15   Catarina Hartshorn, MD  Fluticasone-Salmeterol (ADVAIR) 250-50 MCG/DOSE AEPB Inhale 1 puff into the lungs 2 (two) times daily.    Historical Provider, MD  folic acid (FOLVITE) 1 MG tablet Take 1 mg by mouth daily.     Historical Provider, MD  furosemide (LASIX) 40 MG tablet Take 1 tablet (40 mg total) by mouth daily. 06/14/15   Richarda Overlie, MD  gabapentin (NEURONTIN) 300 MG capsule Take 1 capsule (300 mg total) by mouth 2 (two) times daily. 06/14/15   Richarda Overlie, MD  LINZESS 145 MCG CAPS capsule Take 145 mcg by mouth daily as needed (constipation).  08/13/15   Historical Provider, MD  metFORMIN (GLUCOPHAGE-XR) 500 MG 24 hr tablet Take 500 mg by mouth daily with breakfast.    Historical Provider, MD  metoprolol succinate (TOPROL-XL) 25 MG 24 hr tablet Take 25 mg by mouth daily.    Historical Provider, MD  naproxen sodium (ANAPROX) 275 MG tablet Take 1 tablet (275 mg total) by mouth 2 (two) times daily with a meal. 01/31/16   Arthor Captain, PA-C  Oxycodone HCl 20 MG TABS Take 1 tablet (20 mg total) by mouth every 4 (four) hours. Refill by primary MD 12/25/15   Ripudeep Jenna Luo, MD  oxymorphone (OPANA ER) 40 MG 12 hr tablet Take 1 tablet (40 mg total) by mouth every 12 (twelve) hours. Refill by primary MD 12/25/15   Ripudeep Jenna Luo, MD  pantoprazole (PROTONIX) 40 MG tablet Take 1 tablet (40 mg total) by mouth 2 (two) times daily before a meal. 12/25/15   Ripudeep K Rai, MD  polyethylene glycol (MIRALAX / GLYCOLAX) packet Take 17 g by mouth 2 (two) times daily. 06/14/15   Richarda Overlie, MD  potassium chloride SA (K-DUR,KLOR-CON) 20 MEQ tablet Take 2 tablets (40 mEq total) by mouth daily. 01/27/15   Calvert Cantor, MD  predniSONE (DELTASONE) 10 MG tablet Take 4 tablets (40 mg total) by mouth daily. 01/02/16   Vanetta Mulders, MD  predniSONE (DELTASONE) 5 MG tablet Take 15 mg by mouth daily. 12/14/15   Historical Provider, MD  promethazine (PHENERGAN) 25 MG tablet Take 1 tablet by mouth every 6 (six) hours as needed for nausea or vomiting. nausea 10/06/15   Historical Provider, MD  tiZANidine (ZANAFLEX) 4 MG tablet Take 4 mg by mouth 4 (four) times daily. 08/13/15   Historical Provider, MD  traMADol (ULTRAM) 50 MG tablet Take 1 tablet  (50 mg total) by mouth every 8 (eight) hours as needed for moderate pain. For arthritis pain 12/25/15 12/24/16  Ripudeep Jenna Luo, MD  Vitamin D, Ergocalciferol, (DRISDOL) 50000 UNITS CAPS capsule Take 1 capsule (50,000 Units total) by mouth every 7 (seven) days. Sunday Patient taking differently: Take 50,000 Units by mouth every 7 (seven) days.  01/27/15   Calvert Cantor, MD  VITAMIN E PO Take 1 capsule by mouth daily.    Historical Provider, MD     Physical Exam: Filed Vitals:   02/28/16 2330 02/29/16 0000 02/29/16 0028 02/29/16 0150  BP: 104/67 124/102 112/78 126/68  Pulse: 113 125 118 117  Temp:   98.7 F (37.1 C) 98.9 F (37.2 C)  TempSrc:   Oral Oral  Resp: 15 19 20 20   Height:    5' (1.524 m)  Weight:    64.093 kg (141 lb 4.8 oz)  SpO2: 97% 100% 99% 99%     Constitutional: Vital signs reviewed. Patient is initially seen to be resting peacefully upon entering, but then complains of pain all over while in the room. Head: Normocephalic and atraumatic  Ear: TM normal bilaterally  Mouth: no erythema or exudates, MMM  Eyes: PERRL, EOMI, conjunctivae normal, No scleral icterus.  Neck: Supple, Trachea midline normal ROM, No JVD, mass, thyromegaly, or carotid bruit present.  Cardiovascular: Tachycardic  Pulmonary/Chest: CTAB, no wheezes, rales, or rhonchi  Abdominal: Soft. Non-tender, non-distended, bowel sounds are normal, no masses, organomegaly, or guarding present.  GU: no CVA tenderness Musculoskeletal: No joint deformities, erythema, or stiffness, Decreased range of motion of the left hand with mild swelling noted  Ext: no lower extremity edema and no cyanosis, pulses palpable bilaterally (DP and PT)  Hematology: no cervical, inginal, or axillary adenopathy.  Neurological: A&O x3, Strenght is normal and symmetric bilaterally, cranial nerve II-XII are grossly intact, no focal motor deficit, sensory intact to light touch bilaterally.  Skin: Warm, dry and intact. No rash, cyanosis, or  clubbing.  Psychiatric: Normal mood and affect. speech and behavior is normal. Judgment and thought content normal. Cognition and memory are normal.      Data Review   Micro Results Recent Results (from the past 240 hour(s))  Blood Culture (routine x 2)     Status: None (Preliminary result)   Collection Time: 02/28/16  5:30 PM  Result Value Ref Range Status   Specimen Description   Final    BLOOD RIGHT ANTECUBITAL Performed at [facility]    Special Requests BOTTLES DRAWN AEROBIC AND ANAEROBIC 10CC EACH  Final   Culture PENDING  Incomplete   Report Status PENDING  Incomplete  Blood Culture (routine x 2)     Status: None (Preliminary result)   Collection Time: 02/28/16  8:00 PM  Result Value Ref Range Status   Specimen Description   Final    BLOOD LEFT ANTECUBITAL Performed at [facility]    Special Requests   Final    BOTTLES DRAWN AEROBIC AND ANAEROBIC BLUE 6CC RED 5CC   Culture PENDING  Incomplete   Report Status PENDING  Incomplete    Radiology Reports Dg Chest 2 View  02/28/2016  CLINICAL DATA:  Pain following fall EXAM: CHEST  2 VIEW COMPARISON:  February 19, 2016 FINDINGS: Lungs are clear. Heart is upper normal in size with pulmonary vascularity within normal limits. There is extensive postoperative change throughout the thoracic spine, stable. There old healed rib fractures on the right, stable. No adenopathy evident. No pneumothorax. IMPRESSION: Extensive postoperative change throughout much of the thoracic spine. Old rib trauma on the right. No edema or consolidation. Stable cardiac silhouette. Electronically Signed   By: Bretta Bang III M.D.   On: 02/28/2016 21:05   Dg Chest 2 View  02/19/2016  CLINICAL DATA:  c/o headache radiating down into her chest, pt states she possibly had a seizure at home tonight. Pt also c/o a yeast infection and now has uti symptoms with intermittent abdominal pain, hx of htn, gerd, dm EXAM: CHEST  2 VIEW COMPARISON:   01/02/2016 FINDINGS: Thoracic spine fixation. Vertebral body height loss at the T12 level is felt to be similar to the 01/25/2015 CT. Remote right rib trauma. Midline trachea. Mild cardiomegaly. Left pleural thickening, blunting the left  costophrenic angle. No congestive failure. Clear lungs. IMPRESSION: No acute cardiopulmonary disease. Cardiomegaly without congestive failure. 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 Electronically Signed   By: Jeronimo Greaves M.D.   On: 02/19/2016 02:58   Dg Lumbar Spine Complete  01/31/2016  CLINICAL DATA:  Larey Seat out of wheelchair and slid down wall, with lower back pain. Initial encounter. EXAM: LUMBAR SPINE - COMPLETE 4+ VIEW COMPARISON:  CT of the abdomen and pelvis performed 12/24/2015 FINDINGS: There is no evidence of acute fracture or subluxation. There is chronic compression deformity of vertebral body T12. There is mild grade 1 anterolisthesis of L4 on L5, also stable in appearance. Mild disc space narrowing is noted at L4-L5. The visualized bowel gas pattern is unremarkable in appearance; air and stool are noted within the colon. The sacroiliac joints are within normal limits. Clips are noted within the right upper quadrant, reflecting prior cholecystectomy. Thoracic spinal fusion hardware is partially imaged. IMPRESSION: 1. No evidence of acute fracture or subluxation along the lumbar spine. 2. Chronic compression deformity of vertebral body T12 is stable in appearance. Electronically Signed   By: Roanna Raider M.D.   On: 01/31/2016 00:58   Dg Sacrum/coccyx  01/31/2016  CLINICAL DATA:  Fall from wheelchair. Lower back pain. Initial encounter. EXAM: SACRUM AND COCCYX - 2+ VIEW COMPARISON:  Abdominal CT 12/24/2015 FINDINGS: When allowing for obliquity there is no convincing fracture in the lateral projection. Dorsal positioning of the coccyx is chronic based on comparison CT. Limited frontal projection due to overlapping stool and gas. No  evidence of diastasis. Chronic spurring around the right pubic body. No fracture suspected in this location. Osteopenia. L4-5 disc narrowing and slight anterolisthesis. IMPRESSION: No acute finding. Electronically Signed   By: Marnee Spring M.D.   On: 01/31/2016 01:02   Dg Wrist Complete Left  02/28/2016  CLINICAL DATA:  Fall with left wrist injury and pain. Initial encounter. EXAM: LEFT WRIST - COMPLETE 3+ VIEW COMPARISON:  None. FINDINGS: No acute fracture or dislocation identified. There are advanced degenerative changes involving the carpal bones, radiocarpal joint and multiple CMC joints. Distal ulnar and radial arteries show calcifications, likely reflecting diabetic arterial calcifications. No bony lesions or erosions. Soft tissues are unremarkable. IMPRESSION: No acute fracture identified. Electronically Signed   By: Irish Lack M.D.   On: 02/28/2016 21:04   Ct Head Wo Contrast  02/28/2016  CLINICAL DATA:  Pain following fall 1 day prior.  Lethargy. EXAM: CT HEAD WITHOUT CONTRAST TECHNIQUE: Contiguous axial images were obtained from the base of the skull through the vertex without intravenous contrast. COMPARISON:  July 26, 2015 FINDINGS: The ventricles are normal in size and configuration. There is no intracranial mass, hemorrhage, extra-axial fluid collection, or midline shift. Patchy small vessel disease in the centra semiovale bilaterally is stable. There is no new gray-white compartment lesion. No acute infarct is evident on this study. Bony calvarium appears intact. The mastoid air cells are clear. There is mucosal thickening in each maxillary antrum. No intraorbital lesions are evident. IMPRESSION: Stable patchy periventricular small vessel disease. No intracranial mass, hemorrhage, or extra-axial fluid collection. No acute appearing infarct. Mild maxillary sinus disease bilaterally. Electronically Signed   By: Bretta Bang III M.D.   On: 02/28/2016 21:15   Dg Hip Unilat With  Pelvis 2-3 Views Right  01/31/2016  CLINICAL DATA:  Larey Seat out of wheelchair and slid down wall, with right hip pain. Initial encounter. EXAM: DG HIP (WITH OR WITHOUT PELVIS) 2-3V RIGHT  COMPARISON:  None. FINDINGS: There is no evidence of fracture or dislocation. There appears to be slight chronic deformity at the right side of the pubic symphysis. Both femoral heads are seated normally within their respective acetabula. The proximal right femur appears intact. No significant degenerative change is appreciated. The sacroiliac joints are unremarkable in appearance. The visualized bowel gas pattern is grossly unremarkable in appearance. IMPRESSION: No evidence of acute fracture or dislocation. Electronically Signed   By: Roanna Raider M.D.   On: 01/31/2016 00:59     CBC  Recent Labs Lab 02/28/16 1730  WBC 14.0*  HGB 10.5*  HCT 32.6*  PLT 220  MCV 77.3*  MCH 24.9*  MCHC 32.2  RDW 15.9*  LYMPHSABS 1.7  MONOABS 1.6*  EOSABS 0.0  BASOSABS 0.0    Chemistries   Recent Labs Lab 02/28/16 1730  NA 137  K 3.7  CL 105  CO2 20*  GLUCOSE 99  BUN 16  CREATININE 1.18*  CALCIUM 8.8*  AST 27  ALT 16  ALKPHOS 118  BILITOT 1.8*   ------------------------------------------------------------------------------------------------------------------ estimated creatinine clearance is 43.9 mL/min (by C-G formula based on Cr of 1.18). ------------------------------------------------------------------------------------------------------------------ No results for input(s): HGBA1C in the last 72 hours. ------------------------------------------------------------------------------------------------------------------ No results for input(s): CHOL, HDL, LDLCALC, TRIG, CHOLHDL, LDLDIRECT in the last 72 hours. ------------------------------------------------------------------------------------------------------------------ No results for input(s): TSH, T4TOTAL, T49FREE, THYROIDAB in the last 72  hours.  Invalid input(s): FREET3 ------------------------------------------------------------------------------------------------------------------ No results for input(s): VITAMINB12, FOLATE, FERRITIN, TIBC, IRON, RETICCTPCT in the last 72 hours.  Coagulation profile No results for input(s): INR, PROTIME in the last 168 hours.  No results for input(s): DDIMER in the last 72 hours.  Cardiac Enzymes No results for input(s): CKMB, TROPONINI, MYOGLOBIN in the last 168 hours.  Invalid input(s): CK ------------------------------------------------------------------------------------------------------------------ Invalid input(s): POCBNP   CBG: No results for input(s): GLUCAP in the last 168 hours.     EKG: Independently reviewed. Sinus tachycardia   Assessment/Plan SIRS/Sepsis: Acute. Patient with acute fever up to 101.49F, tachycardia, and tachypnea. Initial evaluation with chest x-ray showed no acute signs of infection, and urinalysis was not impressive only showing a few bacteria and trace leukocytes. Initially thought WBC elevation could be secondary to patient's steroids, but did not explain fever therefore patient was started on sepsis protocol. - Admit to a telemetry beda - Follow-up blood and urine cultures cultures - Started on empiric antibiotics of vancomycin and Zosyn - Tylenol prn fever  Falls: Suspect patient's falls are secondary to multiple narcotic pain medications and muscle relaxants - Physical therapy to eval and treat   Left wrist pain: Acute secondary to fall. X-ray of the wrist showed no acute fractures - Treat with pain medications as seen below  Asthma: No acute exacerbation noted - Albuterol nebs prn - Breo   Chronic pain: Acute on chronic. Patient with a history of chronic back pain secondary to previous surgeries. - Check UDS - Continue oxycodone prn, Valium, and  sustained release MS Contin for Opana - Did not continue Flexeril - May likely need  to adjust home medications  Diabetes mellitus type 2 - Held metformin - CBGs every before meals and at bedtime with sensitive sliding scale insulin  Seizures: Does not appear to patient's on any AED - Seizure precautions - Continue to monitor  Essential hypertension -Continue metoprolol, amlodipine  Metabolic acidosis:  Patient's initial ABG showed a pH of 7.27 with a bicarbonate of 20. Cause of this is unknown at this time - Recheck BMP in a.m.  Lovenox for DVT prophylaxis  Code Status:   full Family Communication: bedside Disposition Plan: admit   Total time spent 55 minutes.Greater than 50% of this time was spent in counseling, explanation of diagnosis, planning of further management, and coordination of care  Clydie Braun [facility] Pager [phone]  If 7PM-7AM, please contact night-coverage [email or web address] Password [facility] 02/29/2016, 2:34 AM

## 2016-02-29 NOTE — Progress Notes (Signed)
   02/29/16 0150  Vitals  Temp 98.9 F (37.2 C)  Temp Source Oral  BP 126/68 mmHg  BP Location Right Arm  BP Method Automatic  Patient Position (if appropriate) Lying  Pulse Rate (!) 117  Pulse Rate Source Dinamap  Resp 20  Oxygen Therapy  SpO2 99 %  O2 Device Nasal Cannula  O2 Flow Rate (L/min) 2 L/min  Height and Weight  Height 5' (1.524 m)  Weight 64.093 kg (141 lb 4.8 oz) (scale c)  Type of Scale Used Standing  BSA (Calculated - sq m) 1.65 sq meters  BMI (Calculated) 27.7  Weight in (lb) to have BMI = 25 127.7  Admitted pt to rm 3E19 from [facility], pt alert and oriented, denied pain at this time, oriented to room, call bell placed within reach, placed on cardiac monitor, CCMD made aware.

## 2016-02-29 NOTE — Evaluation (Signed)
Clinical/Bedside Swallow Evaluation Patient Details  Name: Amber [name] MRN: [id number] Date of Birth: 1956/05/16  Today's Date: 02/29/2016 Time: SLP Start Time (ACUTE ONLY): 1348 SLP Stop Time (ACUTE ONLY): 1405 SLP Time Calculation (min) (ACUTE ONLY): 17 min  Past Medical History:  Past Medical History  Diagnosis Date  . Arthritis   . Asthma   . Hypertension   . Constipation   . GERD (gastroesophageal reflux disease)   . Diabetes mellitus without complication (HCC)   . Chronic pain   . Seizures (HCC)     last seizure March 2015  . Pneumonia 10/2015  . Falls frequently    Past Surgical History:  Past Surgical History  Procedure Laterality Date  . Back surgery    . Cholecystectomy    . Appendectomy     HPI:  60 y.o. female with h/o HTN, asthma, DM type 2, frequent falls, GERD, seizures and pneumonia (10/2015), who presented to ED with c/o repeated falls with L wrist pain. Unsure if she blacked out or had a seizure. Per MD, last seizure possibly over a month ago. Per MD note, suspect patient falls secondary to multiple narcotic pain medications and muscle relaxants. CT Head 4/10 no acute appearing infarct. CXR 4/10 extensive postoperative change throughout much of thoracic spine. Old rib trauma on R. No edema or consolidation.   Assessment / Plan / Recommendation Clinical Impression  No evidence of oropharyngeal dysphagia and no overt s/s of penetration/aspiration. Pt reported difficulty masticating regular solids during lunch due to edentulous status. Pt educated re: diet recommendation of Dysphagia 2 (fine chop) textures (due to impulsitivity and edentulous status) and thin liquids (straws allowed), with meds whole in puree. SLP will f/u to determine diet tolerance and advise advancement of solids.    Aspiration Risk  Mild aspiration risk    Diet Recommendation Dysphagia 2 (Fine chop);Thin liquid   Liquid Administration via: Cup;Straw Medication Administration: Whole meds  with puree Supervision: Patient able to self feed;Intermittent supervision to cue for compensatory strategies Compensations: Minimize environmental distractions;Slow rate;Small sips/bites Postural Changes: Seated upright at 90 degrees    Other  Recommendations Oral Care Recommendations: Oral care BID   Follow up Recommendations   (TBD)    Frequency and Duration min 2x/week  1 week       Prognosis Prognosis for Safe Diet Advancement: Good      Swallow Study   General HPI: 60 y.o. female with h/o HTN, asthma, DM type 2, frequent falls, GERD, seizures and pneumonia (10/2015), who presented to ED with c/o repeated falls with L wrist pain. Unsure if she blacked out or had a seizure. Per MD, last seizure possibly over a month ago. Per MD note, suspect patient falls secondary to multiple narcotic pain medications and muscle relaxants. CT Head 4/10 no acute appearing infarct. CXR 4/10 extensive postoperative change throughout much of thoracic spine. Old rib trauma on R. No edema or consolidation. Type of Study: Bedside Swallow Evaluation Previous Swallow Assessment: none found Diet Prior to this Study: Regular;Thin liquids Temperature Spikes Noted: Yes Respiratory Status: Nasal cannula History of Recent Intubation: No Behavior/Cognition: Alert;Cooperative;Pleasant mood Oral Cavity Assessment: Within Functional Limits Oral Care Completed by SLP: No Oral Cavity - Dentition: Edentulous Vision: Functional for self-feeding Self-Feeding Abilities: Able to feed self Patient Positioning: Upright in bed Baseline Vocal Quality: Normal Volitional Cough: Strong Volitional Swallow: Able to elicit    Oral/Motor/Sensory Function Overall Oral Motor/Sensory Function: Within functional limits   Ice Chips Ice chips: Not tested  Thin Liquid Thin Liquid: Within functional limits Presentation: Self Fed;Cup;Straw    Nectar Thick Nectar Thick Liquid: Not tested   Honey Thick Honey Thick Liquid: Not  tested   Puree Puree: Within functional limits   Solid    Amber [name], Student-SLP Solid: Within functional limits Presentation: Self Fed;Spoon Other Comments: Dysphagia 2 solids        Amber [name] 02/29/2016,2:16 PM

## 2016-02-29 NOTE — Evaluation (Signed)
Physical Therapy Evaluation Patient Details Name: Amber [name] MRN: [id number] DOB: 1956/05/22 Today's Date: 02/29/2016   History of Present Illness  60 year old female with past medical history significant for HTN, DM, seizures, chronic pain, and asthma; who presented with complaints of repeated falls with left wrist pain. Patient also febrile and found to have sepsis.  Clinical Impression  Patient demonstrates deficits in functional mobility as indicated below. Will need continued skilled PT to address deficits and maximize function. Will see as indicated and progress as tolerated.    Follow Up Recommendations Home health PT;Supervision/Assistance - 24 hour (patient states that she may not be eligible for HHPT)    Equipment Recommendations  None recommended by PT    Recommendations for Other Services       Precautions / Restrictions Precautions Precautions: Fall Restrictions Weight Bearing Restrictions: No      Mobility  Bed Mobility Overal bed mobility: Modified Independent                Transfers Overall transfer level: Needs assistance   Transfers: Sit to/from Stand Sit to Stand: Min guard         General transfer comment: min guard for stability, VCs for hand placement and safety  Ambulation/Gait Ambulation/Gait assistance: Min guard Ambulation Distance (Feet): 80 Feet Assistive device: Rolling walker (2 wheeled) Gait Pattern/deviations: Step-through pattern;Decreased stride length;Narrow base of support;Trunk flexed Gait velocity: decreased   General Gait Details: decreased cadence, instability noted with ambulation, heavy reliance on RW  Stairs            Wheelchair Mobility    Modified Rankin (Stroke Patients Only)       Balance                                             Pertinent Vitals/Pain Pain Assessment: No/denies pain    Home Living Family/patient expects to be discharged to:: Private  residence Living Arrangements: Other relatives Available Help at Discharge: Available 24 hours/day;Family Type of Home: House Home Access: Level entry     Home Layout: One level Home Equipment: Environmental consultant - 2 wheels Additional Comments: Pt states her MD is working on getting a Patent examiner for her    Prior Function Level of Independence: Independent with assistive device(s)         Comments: amb with rollator and history of falls     Hand Dominance   Dominant Hand: Right    Extremity/Trunk Assessment   Upper Extremity Assessment: Defer to OT evaluation           Lower Extremity Assessment: Overall WFL for tasks assessed         Communication   Communication: No difficulties  Cognition Arousal/Alertness: Awake/alert Behavior During Therapy: WFL for tasks assessed/performed Overall Cognitive Status: Within Functional Limits for tasks assessed                      General Comments      Exercises        Assessment/Plan    PT Assessment Patient needs continued PT services  PT Diagnosis Difficulty walking;Abnormality of gait;Acute pain   PT Problem List Decreased strength;Decreased activity tolerance;Decreased mobility;Decreased balance;Decreased safety awareness;Decreased knowledge of precautions;Cardiopulmonary status limiting activity;Pain  PT Treatment Interventions DME instruction;Gait training;Stair training;Functional mobility training;Therapeutic activities;Therapeutic exercise;Balance training;Patient/family education   PT Goals (Current goals can be  found in the Care Plan section) Acute Rehab PT Goals Patient Stated Goal: to go home PT Goal Formulation: With patient Time For Goal Achievement: 03/14/16 Potential to Achieve Goals: Fair    Frequency Min 3X/week   Barriers to discharge        Co-evaluation               End of Session Equipment Utilized During Treatment: Gait belt Activity Tolerance: Patient tolerated  treatment well Patient left: in chair;with call bell/phone within reach;with chair alarm set Nurse Communication: Mobility status         Time: 1404-1430 PT Time Calculation (min) (ACUTE ONLY): 26 min   Charges:   PT Evaluation $PT Eval Moderate Complexity: 1 Procedure PT Treatments $Therapeutic Activity: 8-22 mins   PT G CodesFabio Asa 20-Mar-2016, 4:56 PM Charlotte Crumb, PT DPT  [phone]

## 2016-02-29 NOTE — Progress Notes (Signed)
Patient seen and examined  60 year old female with past medical history significant for HTN, DM, seizures, chronic pain, and asthma; who presented with complaints of repeated falls with left wrist pain. Patient reports that she had gotten up to use the restroom and had fell hitting the nightstand. She is unsure if she blacked out or had a seizure.   She reports having increasing falls the last few weeks. She reports her last seizure was possibly over a month ago. Also complaining of four-day history of fever and chills. She is on chronic prednisone from Dr. Harrell Gave rheumatology. At baseline patient ambulates with a walker or wheelchair.  Upon admission patient was seen to have heart rates up to the 130s(sinus tachycardia on EKG), temperature of 101.82F, and blood pressure 123/86. Labs revealed WBC 14, lactic acid 0.64. X-rays of the chest and left wrist showed no acute signs of infection or injury respectively. UA positive for few bacteria, trace leukocytes, 0-5 squamous epithelial cells, and 0-5 WBCs. Patient placed on broad-spectrum antibiotics for possible sepsis of unknown origin. Admitted to a telemetry bed here at [facility]   Assessment and plan SIRS/Sepsis: Acute. Patient with acute fever up to 101.82F, tachycardia, and tachypnea. Initial evaluation with chest x-ray showed no acute signs of infection, and urinalysis was not impressive only showing a few bacteria and trace leukocytes. Initially thought WBC elevation could be secondary to patient's steroids, but did not explain fever therefore patient was started on sepsis protocol. Suspect that the patient aspirated due to somnolence in the setting of multiple sedating medications - Follow-up blood and urine cultures cultures Continue empiric antibiotics of vancomycin and Zosyn - Tylenol prn fever  Metabolic acidosis-minimize sedating medications, repeat ABG, bicarbonate 16, monitor Accu-Cheks, and I'm gap 15, check lactic acid, this was normal  yesterday evening   Falls: Suspect patient's falls are secondary to multiple narcotic pain medications and muscle relaxants - Physical therapy to eval and treat   Left wrist pain: Acute secondary to fall. X-ray of the wrist showed no acute fractures - Treat with pain medications as seen below Suspect that the patient could have gout, escalate steroids to IV Solu-Medrol   Asthma: No acute exacerbation noted - Albuterol nebs prn - Breo  Chronic pain: Acute on chronic. Patient with a history of chronic back pain secondary to previous surgeries. - Check UDS Patient on oxycodone prn, Valium, and does not take   MS Contin  Takes  Opana-dosages adjusted for all the medications, hold all pain medications if the patient appears to be somnolent    Diabetes mellitus type 2 - Held metformin, start Accu-Cheks - CBGs every before meals and at bedtime with sensitive sliding scale insulin Check hemoglobin A1c  Seizures: Does not appear to patient's on any AED - Seizure precautions - Continue to monitor  Essential hypertension -Continue metoprolol, amlodipine

## 2016-02-29 NOTE — ED Notes (Signed)
Pt. Reports to RN upon reassessment that the L wrist has been swollen for a while and the Dr. Who sees her on a regular basis told her this is arthritis.

## 2016-02-29 NOTE — ED Notes (Signed)
Pt resting quietly. Eyes closed. Arouses easily. Requesting ice chips. C/O generalized pain. Prod cough with light green sputum.

## 2016-03-01 ENCOUNTER — Inpatient Hospital Stay (HOSPITAL_COMMUNITY): Payer: Medicaid Other

## 2016-03-01 DIAGNOSIS — E1121 Type 2 diabetes mellitus with diabetic nephropathy: Secondary | ICD-10-CM

## 2016-03-01 LAB — COMPREHENSIVE METABOLIC PANEL
ALBUMIN: 2.6 g/dL — AB (ref 3.5–5.0)
ALT: 14 U/L (ref 14–54)
AST: 18 U/L (ref 15–41)
Alkaline Phosphatase: 83 U/L (ref 38–126)
Anion gap: 11 (ref 5–15)
BUN: 8 mg/dL (ref 6–20)
CHLORIDE: 105 mmol/L (ref 101–111)
CO2: 23 mmol/L (ref 22–32)
Calcium: 8.9 mg/dL (ref 8.9–10.3)
Creatinine, Ser: 0.82 mg/dL (ref 0.44–1.00)
GFR calc Af Amer: >60 mL/min (ref >60)
GFR calc non Af Amer: >60 mL/min (ref >60)
GLUCOSE: 239 mg/dL — AB (ref 65–99)
POTASSIUM: 3.7 mmol/L (ref 3.5–5.1)
SODIUM: 139 mmol/L (ref 135–145)
Total Bilirubin: 0.8 mg/dL (ref 0.3–1.2)
Total Protein: 5.9 g/dL — ABNORMAL LOW (ref 6.5–8.1)

## 2016-03-01 LAB — GLUCOSE, CAPILLARY
Glucose-Capillary: 243 mg/dL — ABNORMAL HIGH (ref 65–99)
Glucose-Capillary: 225 mg/dL — ABNORMAL HIGH (ref 65–99)

## 2016-03-01 LAB — URINE CULTURE: Culture: NO GROWTH

## 2016-03-01 LAB — BLOOD GAS, ARTERIAL
ACID-BASE EXCESS: 0.7 mmol/L (ref 0.0–2.0)
Bicarbonate: 24.8 mEq/L — ABNORMAL HIGH (ref 20.0–24.0)
DRAWN BY: 365271
FIO2: 0.21
O2 SAT: 92.4 %
PATIENT TEMPERATURE: 98.6
PH ART: 7.409 (ref 7.350–7.450)
TCO2: 26.1 mmol/L (ref 0–100)
pCO2 arterial: 40.1 mmHg (ref 35.0–45.0)
pO2, Arterial: 75.8 mmHg — ABNORMAL LOW (ref 80.0–100.0)

## 2016-03-01 LAB — CBC
HCT: 27.6 % — ABNORMAL LOW (ref 36.0–46.0)
Hemoglobin: 8.8 g/dL — ABNORMAL LOW (ref 12.0–15.0)
MCH: 25.1 pg — ABNORMAL LOW (ref 26.0–34.0)
MCHC: 31.9 g/dL (ref 30.0–36.0)
MCV: 78.9 fL (ref 78.0–100.0)
PLATELETS: 193 10*3/uL (ref 150–400)
RBC: 3.5 MIL/uL — ABNORMAL LOW (ref 3.87–5.11)
RDW: 15.9 % — AB (ref 11.5–15.5)
WBC: 7.6 10*3/uL (ref 4.0–10.5)

## 2016-03-01 LAB — HEMOGLOBIN A1C
Hgb A1c MFr Bld: 5.5 % (ref 4.8–5.6)
Mean Plasma Glucose: 111 mg/dL

## 2016-03-01 MED ORDER — PREDNISONE 20 MG PO TABS: 40.0000 mg | ORAL_TABLET | Freq: Every day | ORAL | Status: DC

## 2016-03-01 MED ORDER — DIAZEPAM 5 MG PO TABS: 2.5000 mg | ORAL_TABLET | Freq: Two times a day (BID) | ORAL | Status: DC | PRN

## 2016-03-01 MED ORDER — OXYCODONE HCL 10 MG PO TABS: 10.0000 mg | ORAL_TABLET | ORAL | Status: DC | PRN

## 2016-03-01 MED ORDER — OXYMORPHONE HCL ER 20 MG PO T12A: 20.0000 mg | EXTENDED_RELEASE_TABLET | Freq: Two times a day (BID) | ORAL | Status: DC

## 2016-03-01 MED ORDER — LEVOFLOXACIN 500 MG PO TABS: 500.0000 mg | ORAL_TABLET | Freq: Every day | ORAL | Status: DC

## 2016-03-01 NOTE — Progress Notes (Signed)
Physical Therapy Treatment Patient Details Name: Amber [name] MRN: [id number] DOB: 1956-04-06 Today's Date: 03/01/2016    History of Present Illness 60 year old female with past medical history significant for HTN, DM, seizures, chronic pain, and asthma; who presented with complaints of repeated falls with left wrist pain. Patient also febrile and found to have sepsis.    PT Comments    Patient progressing well with mobility, supervision level. Tolerated ambulation and stair negotiation. Anticipate patient will be safe for d/c home.  Follow Up Recommendations  Home health PT;Supervision/Assistance - 24 hour (patient states that she may not be eligible for HHPT)     Equipment Recommendations  None recommended by PT    Recommendations for Other Services       Precautions / Restrictions Precautions Precautions: Fall Restrictions Weight Bearing Restrictions: No    Mobility  Bed Mobility Overal bed mobility: Modified Independent                Transfers Overall transfer level: Needs assistance   Transfers: Sit to/from Stand Sit to Stand: Min guard         General transfer comment: min guard for stability, VCs for hand placement and safety  Ambulation/Gait Ambulation/Gait assistance: Supervision Ambulation Distance (Feet): 210 Feet Assistive device: Rolling walker (2 wheeled) Gait Pattern/deviations: Step-through pattern;Decreased stride length;Narrow base of support;Trunk flexed Gait velocity: decreased Gait velocity interpretation: Below normal speed for age/gender General Gait Details: Vcs for increased gait speed and upright posture, 2 standing rest breaks   Stairs Stairs: Yes Stairs assistance: Min assist Stair Management: Step to pattern;Forwards;One rail Right Number of Stairs: 3 General stair comments: patient able to perform 3 steps with min assist going up (via UE support on left side, and came down sideways with both hands supported on right  rail.  Wheelchair Mobility    Modified Rankin (Stroke Patients Only)       Balance                                    Cognition Arousal/Alertness: Awake/alert Behavior During Therapy: WFL for tasks assessed/performed Overall Cognitive Status: Within Functional Limits for tasks assessed                      Exercises      General Comments        Pertinent Vitals/Pain Pain Assessment: No/denies pain    Home Living                      Prior Function            PT Goals (current goals can now be found in the care plan section) Acute Rehab PT Goals Patient Stated Goal: to go home PT Goal Formulation: With patient Time For Goal Achievement: 03/14/16 Potential to Achieve Goals: Fair Progress towards PT goals: Progressing toward goals    Frequency  Min 3X/week    PT Plan Current plan remains appropriate    Co-evaluation             End of Session Equipment Utilized During Treatment: Gait belt Activity Tolerance: Patient tolerated treatment well Patient left: in chair;with call bell/phone within reach;with chair alarm set     Time: 1202-1224 PT Time Calculation (min) (ACUTE ONLY): 22 min  Charges:  $Gait Training: 8-22 mins  G CodesFabio Asa Mar 29, 2016, 2:09 PM Charlotte Crumb, PT DPT  [phone]

## 2016-03-01 NOTE — Progress Notes (Signed)
Patient's payer source is Medicaid. Medicaid does not have a covered benefit for [facility] pt/ot without a qualifying diagnosis. Unable to arrange HHC. Lupita Leash with [facility] called to discuss case, pt does not qualify for [facility] per Medicaid guidelines. Abelino Derrick [facility] [phone]

## 2016-03-01 NOTE — Progress Notes (Signed)
Orders received for pt discharge.  Discharge summary printed and reviewed with pt.  Explained medication regimen, and pt had no further questions at this time.  IV removed and site remains clean, dry, intact.  Telemetry removed.  Pt in stable condition and awaiting transport. 

## 2016-03-01 NOTE — Progress Notes (Signed)
Inpatient Diabetes Program Recommendations  AACE/ADA: New Consensus Statement on Inpatient Glycemic Control (2015)  Target Ranges:  Prepandial:   less than 140 mg/dL      Peak postprandial:   less than 180 mg/dL (1-2 hours)      Critically ill patients:  140 - 180 mg/dL   Review of Glycemic Control  Inpatient Diabetes Program Recommendations:  Correction (SSI): consider increasing Novolog to moderate scale  Thank you  Piedad Climes BSN, RN,CDE Inpatient Diabetes Coordinator [phone] (team pager)

## 2016-03-01 NOTE — Discharge Summary (Signed)
Physician Discharge Summary  Jasha Hodzic MRN: [id number] DOB/AGE: 1955/12/30 60 y.o.  PCP: Sandi Mariscal, MD   Admit date: 02/28/2016 Discharge date: 03/01/2016  Discharge Diagnoses:     Principal Problem:   Sepsis [facility]) Active Problems:   History of seizures   Diabetes mellitus type 2, controlled ([place])   Frequent falls   Essential hypertension   Fever Probable aspiration pneumonia   Follow-up recommendations Follow-up with PCP in 3-5 days , including all  additional recommended appointments as below Follow-up CBC, CMP in 3-5 days Patient recommended to modify dosage of Opana and oxycodone, as recommended below Recommend early follow-up with pain clinic to continue receiving prescriptions for adjusted / modified doses Patient discharged with home health     Medication List    STOP taking these medications        oxymorphone 40 MG 12 hr tablet  Commonly known as:  OPANA ER  Replaced by:  Oxymorphone HCl (Crush Resist) 20 MG T12a      TAKE these medications        albuterol 108 (90 Base) MCG/ACT inhaler  Commonly known as:  PROVENTIL HFA;VENTOLIN HFA  Inhale 2 puffs into the lungs every 6 (six) hours as needed for wheezing or shortness of breath.     amLODipine 5 MG tablet  Commonly known as:  NORVASC  Take 5 mg by mouth daily.     aspirin EC 81 MG tablet  Take 1 tablet (81 mg total) by mouth daily.     atorvastatin 10 MG tablet  Commonly known as:  LIPITOR  Take 10 mg by mouth daily.     cholecalciferol 1000 units tablet  Commonly known as:  VITAMIN D  Take 2,000 Units by mouth daily.     cyclobenzaprine 10 MG tablet  Commonly known as:  FLEXERIL  Take 1 tablet (10 mg total) by mouth 2 (two) times daily as needed for muscle spasms.     dextromethorphan-guaiFENesin 30-600 MG 12hr tablet  Commonly known as:  MUCINEX DM  Take 1 tablet by mouth 2 (two) times daily.     diazepam 5 MG tablet  Commonly known as:  VALIUM  Take 0.5 tablets (2.5 mg total) by  mouth every 12 (twelve) hours as needed for muscle spasms.     feeding supplement (ENSURE ENLIVE) Liqd  Take 237 mLs by mouth 2 (two) times daily between meals.     Fluticasone-Salmeterol 250-50 MCG/DOSE Aepb  Commonly known as:  ADVAIR  Inhale 1 puff into the lungs 2 (two) times daily.     folic acid 1 MG tablet  Commonly known as:  FOLVITE  Take 1 mg by mouth daily.     furosemide 40 MG tablet  Commonly known as:  LASIX  Take 1 tablet (40 mg total) by mouth daily.     gabapentin 300 MG capsule  Commonly known as:  NEURONTIN  Take 1 capsule (300 mg total) by mouth 2 (two) times daily.     levofloxacin 500 MG tablet  Commonly known as:  LEVAQUIN  Take 1 tablet (500 mg total) by mouth daily.     LINZESS 145 MCG Caps capsule  Generic drug:  Linaclotide  Take 145 mcg by mouth daily as needed (constipation).     metFORMIN 500 MG 24 hr tablet  Commonly known as:  GLUCOPHAGE-XR  Take 500 mg by mouth daily with breakfast.     metoprolol succinate 25 MG 24 hr tablet  Commonly known as:  TOPROL-XL  Take  25 mg by mouth daily.     Oxycodone HCl 10 MG Tabs  Take 1 tablet (10 mg total) by mouth every 4 (four) hours as needed for severe pain.     Oxymorphone HCl (Crush Resist) 20 MG T12a  Take 20 mg by mouth every 12 (twelve) hours.     pantoprazole 40 MG tablet  Commonly known as:  PROTONIX  Take 1 tablet (40 mg total) by mouth 2 (two) times daily before a meal.     polyethylene glycol packet  Commonly known as:  MIRALAX / GLYCOLAX  Take 17 g by mouth 2 (two) times daily.     potassium chloride SA 20 MEQ tablet  Commonly known as:  K-DUR,KLOR-CON  Take 2 tablets (40 mEq total) by mouth daily.     predniSONE 10 MG tablet  Commonly known as:  DELTASONE  Take 15 mg by mouth daily with breakfast.     promethazine 25 MG tablet  Commonly known as:  PHENERGAN  Take 1 tablet by mouth every 6 (six) hours as needed for nausea or vomiting. nausea     tiZANidine 4 MG tablet   Commonly known as:  ZANAFLEX  Take 4 mg by mouth 4 (four) times daily.     Vitamin D (Ergocalciferol) 50000 units Caps capsule  Commonly known as:  DRISDOL  Take 1 capsule (50,000 Units total) by mouth every 7 (seven) days. Sunday     VITAMIN E PO  Take 1 capsule by mouth daily.         Discharge Condition:  Stable  Discharge Instructions Get Medicines reviewed and adjusted: Please take all your medications with you for your next visit with your Primary MD  Please request your Primary MD to go over all hospital tests and procedure/radiological results at the follow up, please ask your Primary MD to get all Hospital records sent to his/her office.  If you experience worsening of your admission symptoms, develop shortness of breath, life threatening emergency, suicidal or homicidal thoughts you must seek medical attention immediately by calling 911 or calling your MD immediately if symptoms less severe.  You must read complete instructions/literature along with all the possible adverse reactions/side effects for all the Medicines you take and that have been prescribed to you. Take any new Medicines after you have completely understood and accpet all the possible adverse reactions/side effects.   Do not drive when taking Pain medications.   Do not take more than prescribed Pain, Sleep and Anxiety Medications  Special Instructions: If you have smoked or chewed Tobacco in the last 2 yrs please stop smoking, stop any regular Alcohol and or any Recreational drug use.  Wear Seat belts while driving.  Please note  You were cared for by a hospitalist during your hospital stay. Once you are discharged, your primary care physician will handle any further medical issues. Please note that NO REFILLS for any discharge medications will be authorized once you are discharged, as it is imperative that you return to your primary care physician (or establish a relationship with a primary care  physician if you do not have one) for your aftercare needs so that they can reassess your need for medications and monitor your lab values.      Discharge Instructions    Diet - low sodium heart healthy    Complete by:  As directed      Increase activity slowly    Complete by:  As directed  Allergies  Allergen Reactions  . Ivp Dye [Iodinated Diagnostic Agents] Itching      Disposition: 01-Home or Self Care   Consults:   Stable    Significant Diagnostic Studies:  Dg Chest 2 View  03/01/2016  CLINICAL DATA:  Fever starting Monday, no chest complaints EXAM: CHEST  2 VIEW COMPARISON:  02/28/2016 FINDINGS: Cardiomediastinal silhouette is stable. Again noted posterior fusion with metallic rods and transpedicular screws lower cervical/thoracic spine. The alignment is preserved. Old right upper rib fractures are stable. No acute infiltrate or pulmonary edema. IMPRESSION: No active disease. Again noted metallic fixation rods lower cervical and thoracic spine. The alignment is preserved. Cardiomegaly again noted Electronically Signed   By: Natasha Mead M.D.   On: 03/01/2016 10:29   Dg Chest 2 View  02/28/2016  CLINICAL DATA:  Pain following fall EXAM: CHEST  2 VIEW COMPARISON:  February 19, 2016 FINDINGS: Lungs are clear. Heart is upper normal in size with pulmonary vascularity within normal limits. There is extensive postoperative change throughout the thoracic spine, stable. There old healed rib fractures on the right, stable. No adenopathy evident. No pneumothorax. IMPRESSION: Extensive postoperative change throughout much of the thoracic spine. Old rib trauma on the right. No edema or consolidation. Stable cardiac silhouette. Electronically Signed   By: Bretta Bang III M.D.   On: 02/28/2016 21:05   Dg Chest 2 View  02/19/2016  CLINICAL DATA:  c/o headache radiating down into her chest, pt states she possibly had a seizure at home tonight. Pt also c/o a yeast infection and now  has uti symptoms with intermittent abdominal pain, hx of htn, gerd, dm EXAM: CHEST  2 VIEW COMPARISON:  01/02/2016 FINDINGS: Thoracic spine fixation. Vertebral body height loss at the T12 level is felt to be similar to the 01/25/2015 CT. Remote right rib trauma. Midline trachea. Mild cardiomegaly. Left pleural thickening, blunting the left costophrenic angle. No congestive failure. Clear lungs. IMPRESSION: No acute cardiopulmonary disease. Cardiomegaly without congestive failure. 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 Electronically Signed   By: Jeronimo Greaves M.D.   On: 02/19/2016 02:58   Dg Wrist Complete Left  02/28/2016  CLINICAL DATA:  Fall with left wrist injury and pain. Initial encounter. EXAM: LEFT WRIST - COMPLETE 3+ VIEW COMPARISON:  None. FINDINGS: No acute fracture or dislocation identified. There are advanced degenerative changes involving the carpal bones, radiocarpal joint and multiple CMC joints. Distal ulnar and radial arteries show calcifications, likely reflecting diabetic arterial calcifications. No bony lesions or erosions. Soft tissues are unremarkable. IMPRESSION: No acute fracture identified. Electronically Signed   By: Irish Lack M.D.   On: 02/28/2016 21:04   Ct Head Wo Contrast  02/28/2016  CLINICAL DATA:  Pain following fall 1 day prior.  Lethargy. EXAM: CT HEAD WITHOUT CONTRAST TECHNIQUE: Contiguous axial images were obtained from the base of the skull through the vertex without intravenous contrast. COMPARISON:  July 26, 2015 FINDINGS: The ventricles are normal in size and configuration. There is no intracranial mass, hemorrhage, extra-axial fluid collection, or midline shift. Patchy small vessel disease in the centra semiovale bilaterally is stable. There is no new gray-white compartment lesion. No acute infarct is evident on this study. Bony calvarium appears intact. The mastoid air cells are clear. There is mucosal thickening in each maxillary  antrum. No intraorbital lesions are evident. IMPRESSION: Stable patchy periventricular small vessel disease. No intracranial mass, hemorrhage, or extra-axial fluid collection. No acute appearing infarct. Mild maxillary sinus disease bilaterally. Electronically  Signed   By: Lowella Grip III M.D.   On: 02/28/2016 21:15       Filed Weights   02/29/16 0150 03/01/16 0617  Weight: 64.093 kg (141 lb 4.8 oz) 63.957 kg (141 lb)     Microbiology: Recent Results (from the past 240 hour(s))  Blood Culture (routine x 2)     Status: None (Preliminary result)   Collection Time: 02/28/16  5:30 PM  Result Value Ref Range Status   Specimen Description BLOOD RIGHT ANTECUBITAL  Final   Special Requests BOTTLES DRAWN AEROBIC AND ANAEROBIC 10CC EACH  Final   Culture   Final    NO GROWTH < 24 HOURS Performed at [facility]    Report Status PENDING  Incomplete  Urine culture     Status: None   Collection Time: 02/28/16  7:50 PM  Result Value Ref Range Status   Specimen Description URINE, CATHETERIZED  Final   Special Requests NONE  Final   Culture   Final    NO GROWTH 2 DAYS Performed at [facility]    Report Status 03/01/2016 FINAL  Final  Blood Culture (routine x 2)     Status: None (Preliminary result)   Collection Time: 02/28/16  8:00 PM  Result Value Ref Range Status   Specimen Description BLOOD LEFT ANTECUBITAL  Final   Special Requests   Final    BOTTLES DRAWN AEROBIC AND ANAEROBIC BLUE 6CC RED 5CC   Culture   Final    NO GROWTH < 24 HOURS Performed at [facility]    Report Status PENDING  Incomplete       Blood Culture    Component Value Date/Time   SDES BLOOD LEFT ANTECUBITAL 02/28/2016 2000   SPECREQUEST  02/28/2016 2000    BOTTLES DRAWN AEROBIC AND ANAEROBIC BLUE 6CC RED 5CC   CULT  02/28/2016 2000    NO GROWTH < 24 HOURS Performed at [place] PENDING 02/28/2016 2000      Labs: Results for orders placed or  performed during the hospital encounter of 02/28/16 (from the past 48 hour(s))  Comprehensive metabolic panel     Status: Abnormal   Collection Time: 02/28/16  5:30 PM  Result Value Ref Range   Sodium 137 135 - 145 mmol/L   Potassium 3.7 3.5 - 5.1 mmol/L   Chloride 105 101 - 111 mmol/L   CO2 20 (L) 22 - 32 mmol/L   Glucose, Bld 99 65 - 99 mg/dL   BUN 16 6 - 20 mg/dL   Creatinine, Ser 1.18 (H) 0.44 - 1.00 mg/dL   Calcium 8.8 (L) 8.9 - 10.3 mg/dL   Total Protein 7.2 6.5 - 8.1 g/dL   Albumin 3.5 3.5 - 5.0 g/dL   AST 27 15 - 41 U/L   ALT 16 14 - 54 U/L   Alkaline Phosphatase 118 38 - 126 U/L   Total Bilirubin 1.8 (H) 0.3 - 1.2 mg/dL   GFR calc non Af Amer 50 (L) >60 mL/min   GFR calc Af Amer 58 (L) >60 mL/min    Comment: (NOTE) The eGFR has been calculated using the CKD EPI equation. This calculation has not been validated in all clinical situations. eGFR's persistently <60 mL/min signify possible Chronic Kidney Disease.    Anion gap 12 5 - 15  CBC WITH DIFFERENTIAL     Status: Abnormal   Collection Time: 02/28/16  5:30 PM  Result Value Ref Range  WBC 14.0 (H) 4.0 - 10.5 K/uL   RBC 4.22 3.87 - 5.11 MIL/uL   Hemoglobin 10.5 (L) 12.0 - 15.0 g/dL   HCT 62.4 (L) 46.9 - 50.7 %   MCV 77.3 (L) 78.0 - 100.0 fL   MCH 24.9 (L) 26.0 - 34.0 pg   MCHC 32.2 30.0 - 36.0 g/dL   RDW 22.5 (H) 75.0 - 51.8 %   Platelets 220 150 - 400 K/uL   Neutrophils Relative % 77 %   Neutro Abs 10.7 (H) 1.7 - 7.7 K/uL   Lymphocytes Relative 12 %   Lymphs Abs 1.7 0.7 - 4.0 K/uL   Monocytes Relative 11 %   Monocytes Absolute 1.6 (H) 0.1 - 1.0 K/uL   Eosinophils Relative 0 %   Eosinophils Absolute 0.0 0.0 - 0.7 K/uL   Basophils Relative 0 %   Basophils Absolute 0.0 0.0 - 0.1 K/uL  Blood Culture (routine x 2)     Status: None (Preliminary result)   Collection Time: 02/28/16  5:30 PM  Result Value Ref Range   Specimen Description BLOOD RIGHT ANTECUBITAL    Special Requests BOTTLES DRAWN AEROBIC AND  ANAEROBIC 10CC EACH    Culture      NO GROWTH < 24 HOURS Performed at [facility]    Report Status PENDING   I-Stat CG4 Lactic Acid, ED     Status: None   Collection Time: 02/28/16  7:42 PM  Result Value Ref Range   Lactic Acid, Venous 0.64 0.5 - 2.0 mmol/L  Urinalysis, Routine w reflex microscopic (not at [facility])     Status: Abnormal   Collection Time: 02/28/16  7:50 PM  Result Value Ref Range   Color, Urine AMBER (A) YELLOW    Comment: BIOCHEMICALS MAY BE AFFECTED BY COLOR   APPearance CLEAR CLEAR   Specific Gravity, Urine 1.025 1.005 - 1.030   pH 6.0 5.0 - 8.0   Glucose, UA NEGATIVE NEGATIVE mg/dL   Hgb urine dipstick NEGATIVE NEGATIVE   Bilirubin Urine MODERATE (A) NEGATIVE   Ketones, ur 15 (A) NEGATIVE mg/dL   Protein, ur 335 (A) NEGATIVE mg/dL   Nitrite NEGATIVE NEGATIVE   Leukocytes, UA TRACE (A) NEGATIVE  Urine culture     Status: None   Collection Time: 02/28/16  7:50 PM  Result Value Ref Range   Specimen Description URINE, CATHETERIZED    Special Requests NONE    Culture      NO GROWTH 2 DAYS Performed at [facility]    Report Status 03/01/2016 FINAL   Urine microscopic-add on     Status: Abnormal   Collection Time: 02/28/16  7:50 PM  Result Value Ref Range   Squamous Epithelial / LPF 0-5 (A) NONE SEEN   WBC, UA 0-5 0 - 5 WBC/hpf   RBC / HPF NONE SEEN 0 - 5 RBC/hpf   Bacteria, UA FEW (A) NONE SEEN   Casts HYALINE CASTS (A) NEGATIVE   Urine-Other MUCOUS PRESENT   Blood Culture (routine x 2)     Status: None (Preliminary result)   Collection Time: 02/28/16  8:00 PM  Result Value Ref Range   Specimen Description BLOOD LEFT ANTECUBITAL    Special Requests      BOTTLES DRAWN AEROBIC AND ANAEROBIC BLUE 6CC RED 5CC   Culture      NO GROWTH < 24 HOURS Performed at [facility]    Report Status PENDING   I-Stat Arterial Blood Gas, ED - (order at [facility] and  [facility] only)     Status: Abnormal   Collection Time: 02/28/16  8:07 PM  Result Value Ref  Range   pH, Arterial 7.272 (L) 7.350 - 7.450   pCO2 arterial 39.7 35.0 - 45.0 mmHg   pO2, Arterial 72.0 (L) 80.0 - 100.0 mmHg   Bicarbonate 18.0 (L) 20.0 - 24.0 mEq/L   TCO2 19 0 - 100 mmol/L   O2 Saturation 90.0 %   Acid-base deficit 8.0 (H) 0.0 - 2.0 mmol/L   Patient temperature 101.8 F    Collection site RADIAL, ALLEN'S TEST ACCEPTABLE    Drawn by RT    Sample type ARTERIAL   Salicylate level     Status: None   Collection Time: 02/29/16 12:20 AM  Result Value Ref Range   Salicylate Lvl <0.9 2.8 - 30.0 mg/dL  CBC     Status: Abnormal   Collection Time: 02/29/16  3:53 AM  Result Value Ref Range   WBC 13.3 (H) 4.0 - 10.5 K/uL   RBC 3.78 (L) 3.87 - 5.11 MIL/uL   Hemoglobin 9.1 (L) 12.0 - 15.0 g/dL   HCT 30.0 (L) 36.0 - 46.0 %   MCV 79.4 78.0 - 100.0 fL   MCH 24.1 (L) 26.0 - 34.0 pg   MCHC 30.3 30.0 - 36.0 g/dL   RDW 15.9 (H) 11.5 - 15.5 %   Platelets 190 150 - 400 K/uL  Basic metabolic panel     Status: Abnormal   Collection Time: 02/29/16  3:53 AM  Result Value Ref Range   Sodium 135 135 - 145 mmol/L   Potassium 4.3 3.5 - 5.1 mmol/L   Chloride 104 101 - 111 mmol/L   CO2 16 (L) 22 - 32 mmol/L   Glucose, Bld 176 (H) 65 - 99 mg/dL   BUN 14 6 - 20 mg/dL   Creatinine, Ser 1.13 (H) 0.44 - 1.00 mg/dL   Calcium 8.0 (L) 8.9 - 10.3 mg/dL   GFR calc non Af Amer 53 (L) >60 mL/min   GFR calc Af Amer >60 >60 mL/min    Comment: (NOTE) The eGFR has been calculated using the CKD EPI equation. This calculation has not been validated in all clinical situations. eGFR's persistently <60 mL/min signify possible Chronic Kidney Disease.    Anion gap 15 5 - 15  Uric acid     Status: None   Collection Time: 02/29/16  9:37 AM  Result Value Ref Range   Uric Acid, Serum 4.0 2.3 - 6.6 mg/dL  Lactic acid, plasma     Status: None   Collection Time: 02/29/16  9:37 AM  Result Value Ref Range   Lactic Acid, Venous 1.3 0.5 - 2.0 mmol/L  Ammonia     Status: None   Collection Time: 02/29/16  9:37  AM  Result Value Ref Range   Ammonia 21 9 - 35 umol/L  Hemoglobin A1c     Status: None   Collection Time: 02/29/16  9:37 AM  Result Value Ref Range   Hgb A1c MFr Bld 5.5 4.8 - 5.6 %    Comment: (NOTE)         Pre-diabetes: 5.7 - 6.4         Diabetes: >6.4         Glycemic control for adults with diabetes: <7.0    Mean Plasma Glucose 111 mg/dL    Comment: (NOTE) Performed At: [facility] [address] [place], [place] [id number] Lindon Romp MD [id number]   Urine rapid drug  screen (hosp performed)     Status: Abnormal   Collection Time: 02/29/16 10:50 AM  Result Value Ref Range   Opiates POSITIVE (A) NONE DETECTED   Cocaine NONE DETECTED NONE DETECTED   Benzodiazepines POSITIVE (A) NONE DETECTED   Amphetamines NONE DETECTED NONE DETECTED   Tetrahydrocannabinol NONE DETECTED NONE DETECTED   Barbiturates NONE DETECTED NONE DETECTED    Comment:        DRUG SCREEN FOR MEDICAL PURPOSES ONLY.  IF CONFIRMATION IS NEEDED FOR ANY PURPOSE, NOTIFY LAB WITHIN 5 DAYS.        LOWEST DETECTABLE LIMITS FOR URINE DRUG SCREEN Drug Class       Cutoff (ng/mL) Amphetamine      1000 Barbiturate      200 Benzodiazepine   165 Tricyclics       537 Opiates          300 Cocaine          300 THC              50   Blood gas, arterial     Status: Abnormal   Collection Time: 02/29/16 11:00 AM  Result Value Ref Range   O2 Content 2.0 L/min   Delivery systems NASAL CANNULA    pH, Arterial 7.363 7.350 - 7.450   pCO2 arterial 38.5 35.0 - 45.0 mmHg   pO2, Arterial 78.1 (L) 80.0 - 100.0 mmHg   Bicarbonate 21.3 20.0 - 24.0 mEq/L   TCO2 22.4 0 - 100 mmol/L   Acid-base deficit 3.2 (H) 0.0 - 2.0 mmol/L   O2 Saturation 93.6 %   Patient temperature 99.3    Collection site RIGHT RADIAL    Drawn by [phone]    Sample type ARTERIAL DRAW    Allens test (pass/fail) PASS PASS  Glucose, capillary     Status: Abnormal   Collection Time: 02/29/16 12:50 PM  Result Value Ref Range    Glucose-Capillary 226 (H) 65 - 99 mg/dL  Glucose, capillary     Status: Abnormal   Collection Time: 02/29/16  4:43 PM  Result Value Ref Range   Glucose-Capillary 278 (H) 65 - 99 mg/dL  Glucose, capillary     Status: Abnormal   Collection Time: 02/29/16  9:38 PM  Result Value Ref Range   Glucose-Capillary 221 (H) 65 - 99 mg/dL  Blood gas, arterial     Status: Abnormal   Collection Time: 03/01/16  4:21 AM  Result Value Ref Range   FIO2 0.21    pH, Arterial 7.409 7.350 - 7.450   pCO2 arterial 40.1 35.0 - 45.0 mmHg   pO2, Arterial 75.8 (L) 80.0 - 100.0 mmHg   Bicarbonate 24.8 (H) 20.0 - 24.0 mEq/L   TCO2 26.1 0 - 100 mmol/L   Acid-Base Excess 0.7 0.0 - 2.0 mmol/L   O2 Saturation 92.4 %   Patient temperature 98.6    Collection site LEFT RADIAL    Drawn by [id number]    Sample type ARTERIAL DRAW    Allens test (pass/fail) PASS PASS  Glucose, capillary     Status: Abnormal   Collection Time: 03/01/16  6:16 AM  Result Value Ref Range   Glucose-Capillary 243 (H) 65 - 99 mg/dL  CBC     Status: Abnormal   Collection Time: 03/01/16  7:40 AM  Result Value Ref Range   WBC 7.6 4.0 - 10.5 K/uL   RBC 3.50 (L) 3.87 - 5.11 MIL/uL   Hemoglobin 8.8 (L) 12.0 - 15.0 g/dL  HCT 27.6 (L) 36.0 - 46.0 %   MCV 78.9 78.0 - 100.0 fL   MCH 25.1 (L) 26.0 - 34.0 pg   MCHC 31.9 30.0 - 36.0 g/dL   RDW 15.9 (H) 11.5 - 15.5 %   Platelets 193 150 - 400 K/uL  Comprehensive metabolic panel     Status: Abnormal   Collection Time: 03/01/16  7:40 AM  Result Value Ref Range   Sodium 139 135 - 145 mmol/L   Potassium 3.7 3.5 - 5.1 mmol/L   Chloride 105 101 - 111 mmol/L   CO2 23 22 - 32 mmol/L   Glucose, Bld 239 (H) 65 - 99 mg/dL   BUN 8 6 - 20 mg/dL   Creatinine, Ser 0.82 0.44 - 1.00 mg/dL   Calcium 8.9 8.9 - 10.3 mg/dL   Total Protein 5.9 (L) 6.5 - 8.1 g/dL   Albumin 2.6 (L) 3.5 - 5.0 g/dL   AST 18 15 - 41 U/L   ALT 14 14 - 54 U/L   Alkaline Phosphatase 83 38 - 126 U/L   Total Bilirubin 0.8 0.3 - 1.2 mg/dL    GFR calc non Af Amer >60 >60 mL/min   GFR calc Af Amer >60 >60 mL/min    Comment: (NOTE) The eGFR has been calculated using the CKD EPI equation. This calculation has not been validated in all clinical situations. eGFR's persistently <60 mL/min signify possible Chronic Kidney Disease.    Anion gap 11 5 - 15     Lipid Panel     Component Value Date/Time   CHOL 118 07/28/2015 0238   TRIG 146 07/28/2015 0238   HDL 43 07/28/2015 0238   CHOLHDL 2.7 07/28/2015 0238   VLDL 29 07/28/2015 0238   LDLCALC 46 07/28/2015 0238     Lab Results  Component Value Date   HGBA1C 5.5 02/29/2016   HGBA1C 5.8* 11/07/2015   HGBA1C 5.5 07/28/2015     Lab Results  Component Value Date   LDLCALC 46 07/28/2015   CREATININE 0.82 03/01/2016     60 year old female with past medical history significant for HTN, DM, seizures, chronic pain, and asthma; who presented with complaints of repeated falls with left wrist pain. Patient reports that she had gotten up to use the restroom and had fell hitting the nightstand. She is unsure if she blacked out or had a seizure. She reports having increasing falls the last few weeks. She reports her last seizure was possibly over a month ago. Also complaining of four-day history of fever and chills. She is on chronic prednisone from Dr. Georgena Spurling rheumatology. At baseline patient ambulates with a walker or wheelchair.  Upon admission patient was seen to have heart rates up to the 130s(sinus tachycardia on EKG), temperature of 101.77F, and blood pressure 123/86. Labs revealed WBC 14, lactic acid 0.64. X-rays of the chest and left wrist showed no acute signs of infection or injury respectively. UA positive for few bacteria, trace leukocytes, 0-5 squamous epithelial cells, and 0-5 WBCs. Patient placed on broad-spectrum antibiotics for possible sepsis of unknown origin. Admitted to a telemetry bed here at [facility] and plan SIRS/Sepsis: Presented with  Fever of101.77F, tachycardia, and tachypnea. Initial evaluation with chest x-ray showed no acute signs of infection, and urinalysis was not impressive only showing a few bacteria and trace leukocytes. Initially thought WBC elevation could be secondary to patient's steroids, but did not explain fever therefore patient was started on sepsis protocol. Suspect that the patient aspirated  due to somnolence in the setting of multiple sedating medications urine culture blood culture no growth so far   initiated on on empiric antibiotics of vancomycin and Zosyn, de-escalated antibiotics to levaquin for 5 days  repeat chest x-ray To rule out pneumonia,No active disease Suspect fever due to aspiration pneumonitis   Metabolic acidosis-minimize sedating medications, repeat ABG Shows improvement,  Metabolic acidosis improved, unclear etiology, bicarbonate 23, monitor Accu-Cheks, anion gap 11, lactic acid 1.3, Continue to follow   Falls: Suspect patient's falls are secondary to multiple narcotic pain medications and muscle relaxants - Physical therapy to eval and treat Recommend home health and 24-hour supervision   Left wrist pain: Acute secondary to fall. X-ray of the wrist showed no acute fractures Uric acid 4.0 Suspect that the patient could have gout, change Solu-Medrol to prednisone today   Asthma: No acute exacerbation noted - Albuterol nebs prn - Breo, steroids which are being tapered back to home dose   Chronic pain: Acute on chronic. Patient with a history of chronic back pain secondary to previous surgeries. UDS positive for benzodiazepines and opiates Patient on oxycodone prn, Valium, and does not take MS Contin  Takes Opana 40 mg twice a day, currently at 20 mg twice a day, Also takes 20 mg of oxycodone every 4 hours,-dosages adjusted for all the medications, hold all pain medications if the patient appears to be somnolent Patient advised not to drive if the patient  is somnolent.  Diabetes mellitus type 2,  - Held metformin, continue Accu-Cheks - CBGs in the 200s, continue sensitive sliding scale insulin hemoglobin A1c 5.5  Seizures: Does not appear to patient's on any AED - Seizure precautions - Continue to monitor  Essential hypertension -Continue metoprolol, amlodipine     Discharge Exam:    Blood pressure 128/82, pulse 106, temperature 97.9 F (36.6 C), temperature source Oral, resp. rate 18, height 5' (1.524 m), weight 63.957 kg (141 lb), SpO2 100 %.   General: No acute respiratory distress Lungs: Clear to auscultation bilaterally without wheezes or crackles Cardiovascular: Regular rate and rhythm without murmur gallop or rub normal S1 and S2 Abdomen: Nontender, nondistended, soft, bowel sounds positive, no rebound, no ascites, no appreciable mass Extremities: No significant cyanosis, clubbing, or edema bilateral lower extremities   Follow-up Information    Follow up with Sandi Mariscal, MD. Schedule an appointment as soon as possible for a visit in 3 days.   Specialty:  Internal Medicine   Contact information:   [address] [address] [phone]       Signed: Reyne Dumas 03/01/2016, 10:42 AM        Time spent >45 mins

## 2016-03-04 LAB — CULTURE, BLOOD (ROUTINE X 2)
CULTURE: NO GROWTH
Culture: NO GROWTH

## 2016-03-17 IMAGING — CR DG LUMBAR SPINE COMPLETE 4+V
5 series · 5 of 5 positions shown · non-contrast
Comparison: CT of the abdomen and pelvis performed 12/24/2015

CLINICAL DATA: Fell out of wheelchair and slid down wall, with
lower back pain. Initial encounter.

EXAM:
LUMBAR SPINE - COMPLETE 4+ VIEW

[t l-spine a.p.]
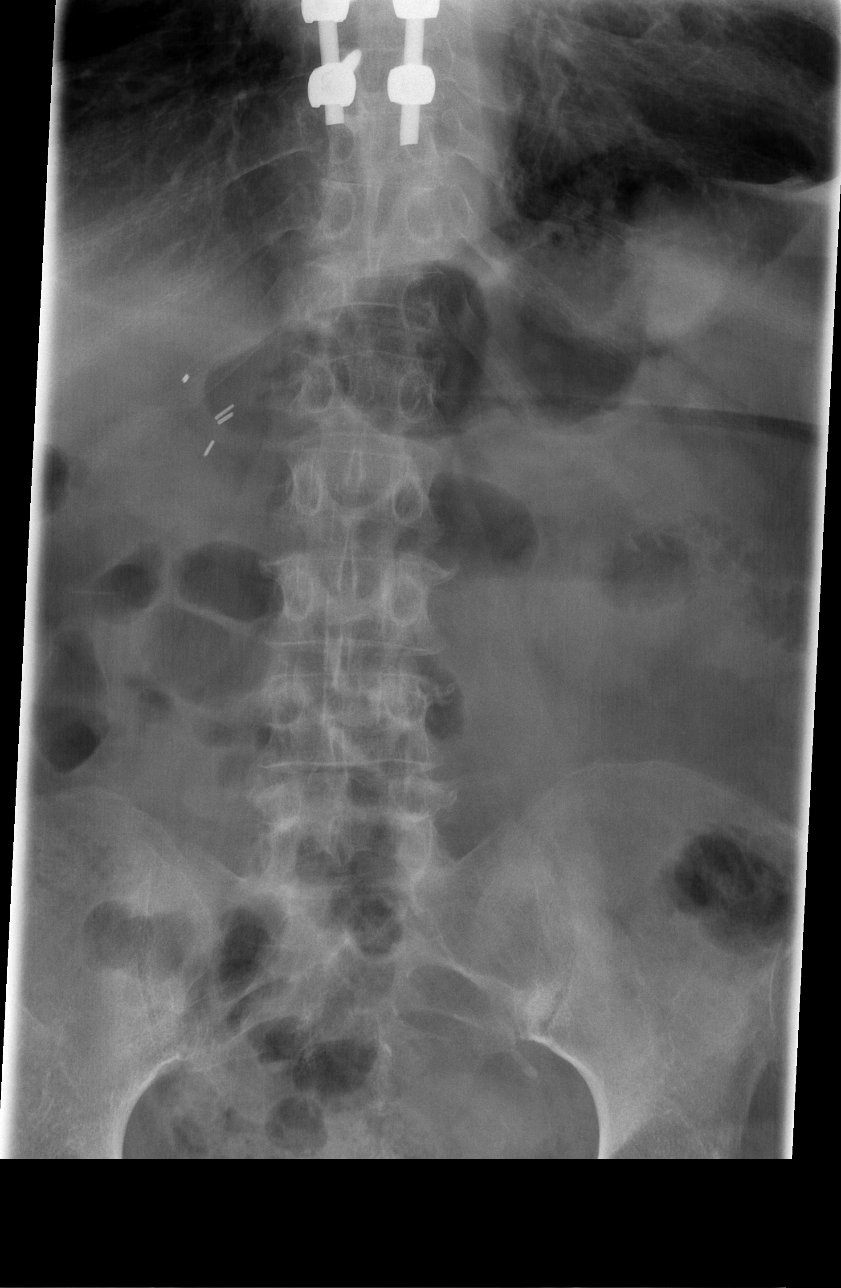

[t l-spine oblique exposure (1 of 2)]
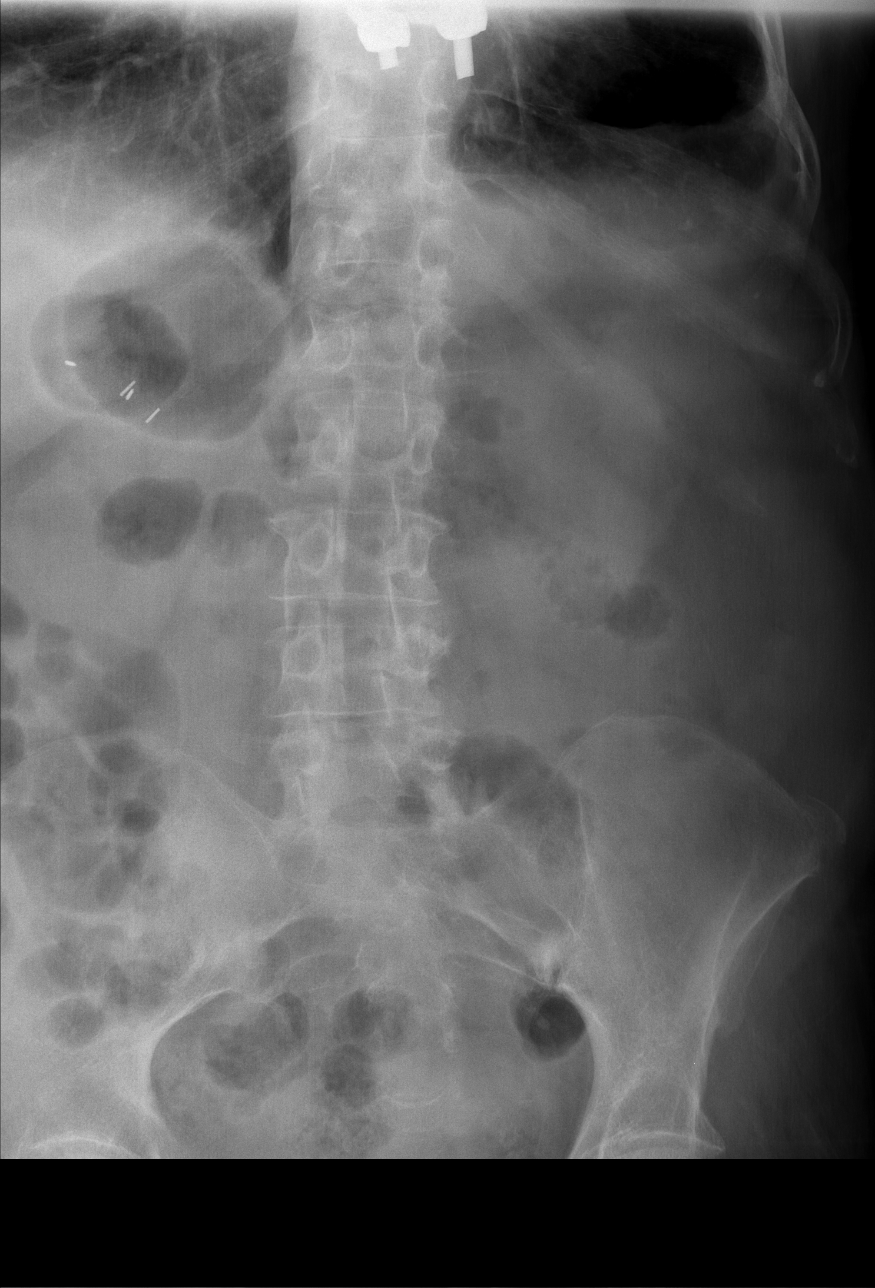

[t l-spine oblique exposure (2 of 2)]
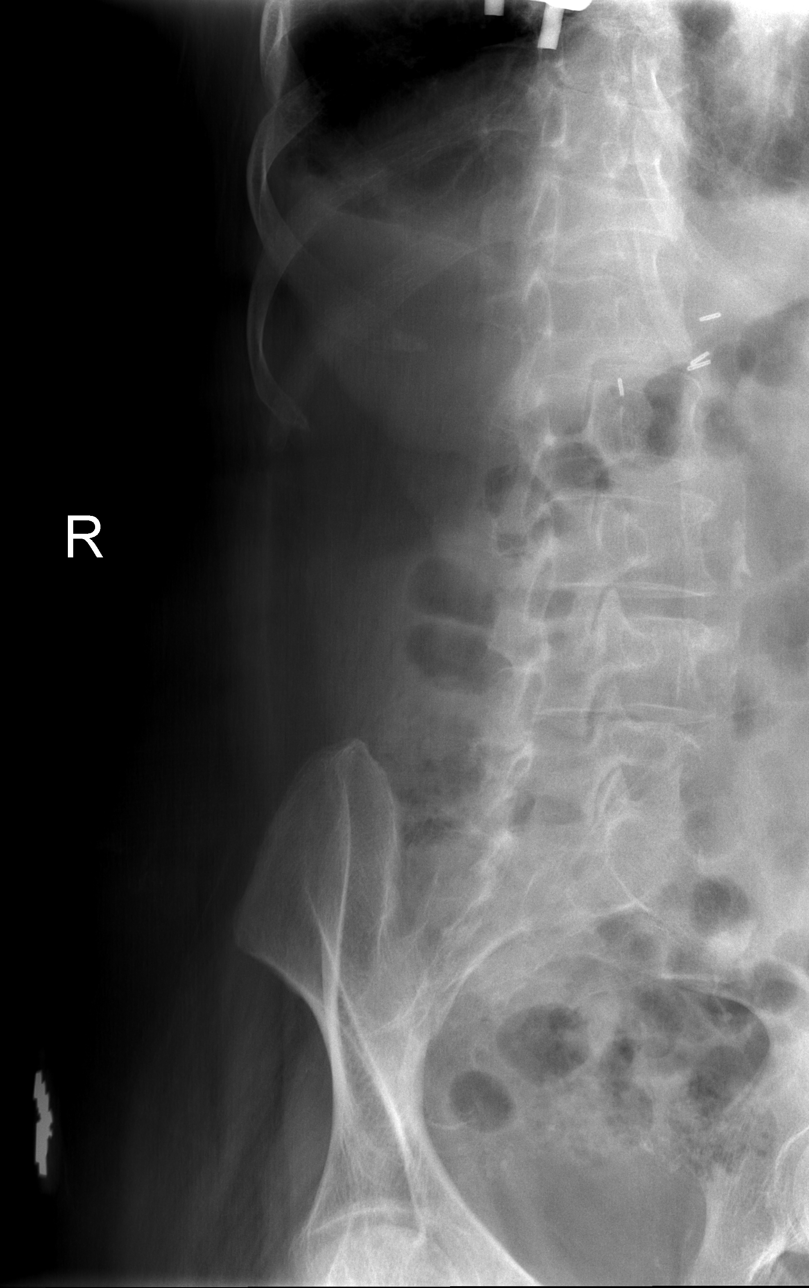

[t l-spine lat]
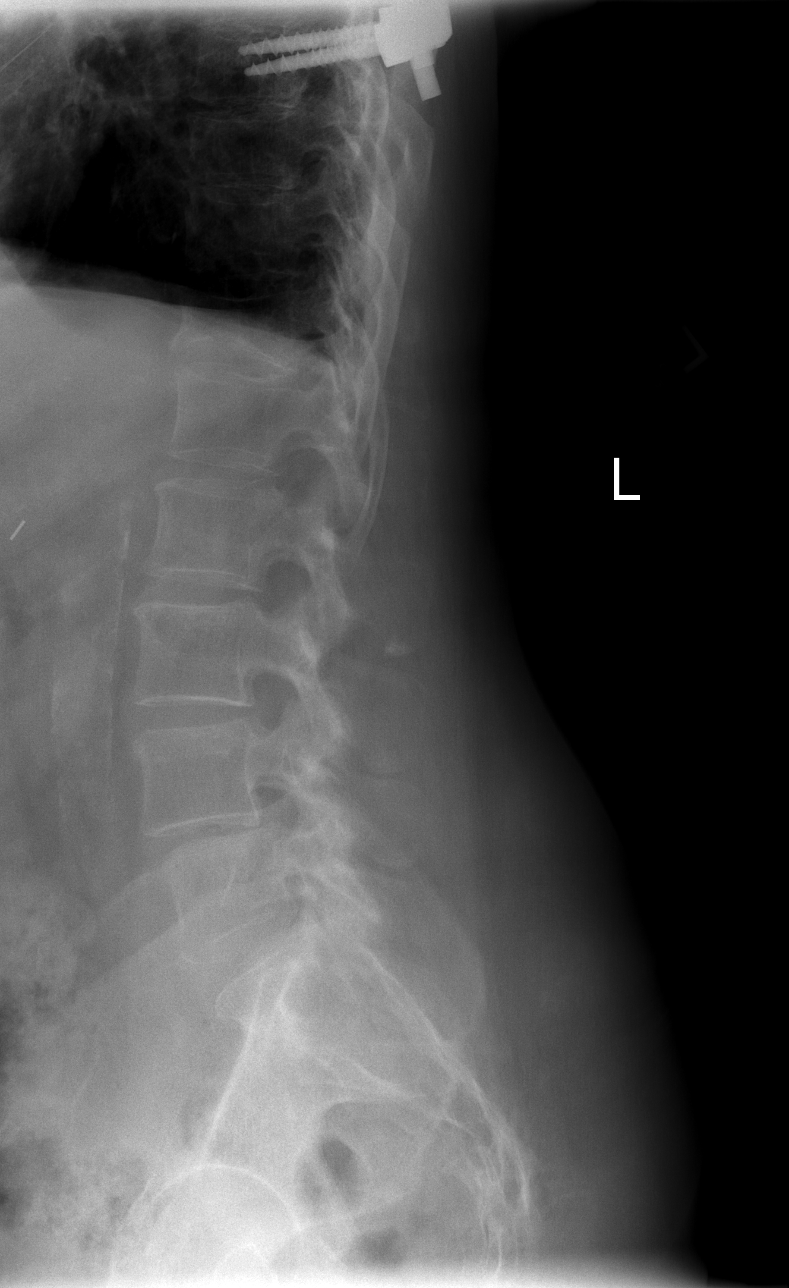

[t l-spine l5-s1 spot]
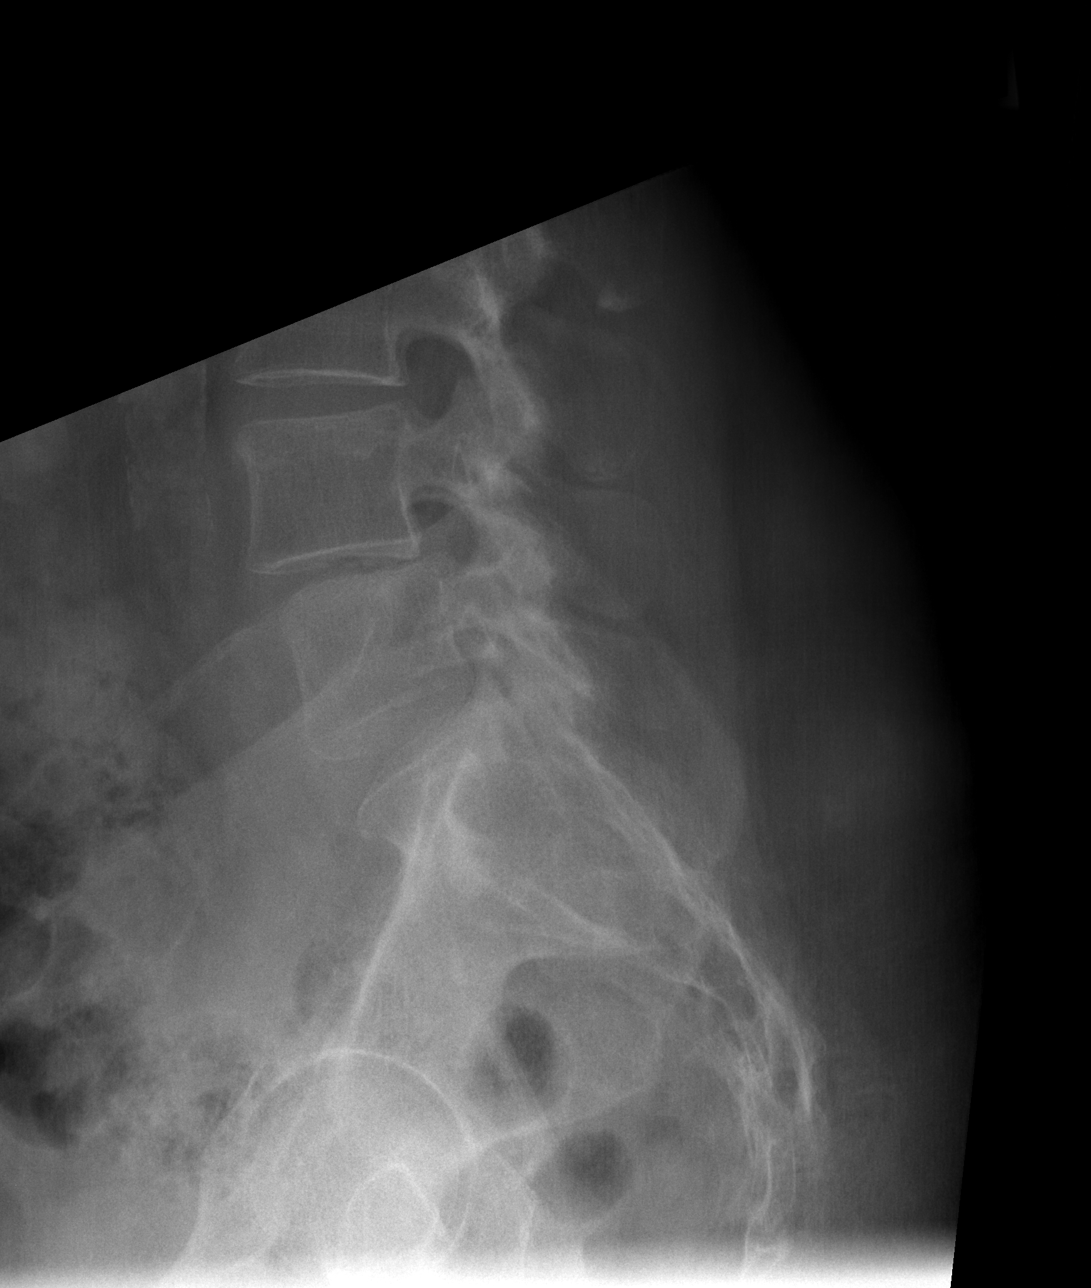

[5 of 5 positions shown; findings below may reference images not displayed]

FINDINGS: There is no evidence of acute fracture or subluxation. There is
chronic compression deformity of vertebral body T12. There is mild
grade 1 anterolisthesis of L4 on L5, also stable in appearance. Mild
disc space narrowing is noted at L4-L5.

The visualized bowel gas pattern is unremarkable in appearance; air
and stool are noted within the colon. The sacroiliac joints are
within normal limits. Clips are noted within the right upper
quadrant, reflecting prior cholecystectomy. Thoracic spinal fusion
hardware is partially imaged.
IMPRESSION: 1. No evidence of acute fracture or subluxation along the lumbar
spine.
2. Chronic compression deformity of vertebral body T12 is stable in
appearance.

## 2016-03-17 IMAGING — CR DG SACRUM/COCCYX 2+V
3 series · 3 of 3 positions shown · non-contrast
Comparison: Abdominal CT 12/24/2015

CLINICAL DATA: Fall from wheelchair. Lower back pain. Initial
encounter.

EXAM:
SACRUM AND COCCYX - 2+ VIEW

[t sacrum a.p.]
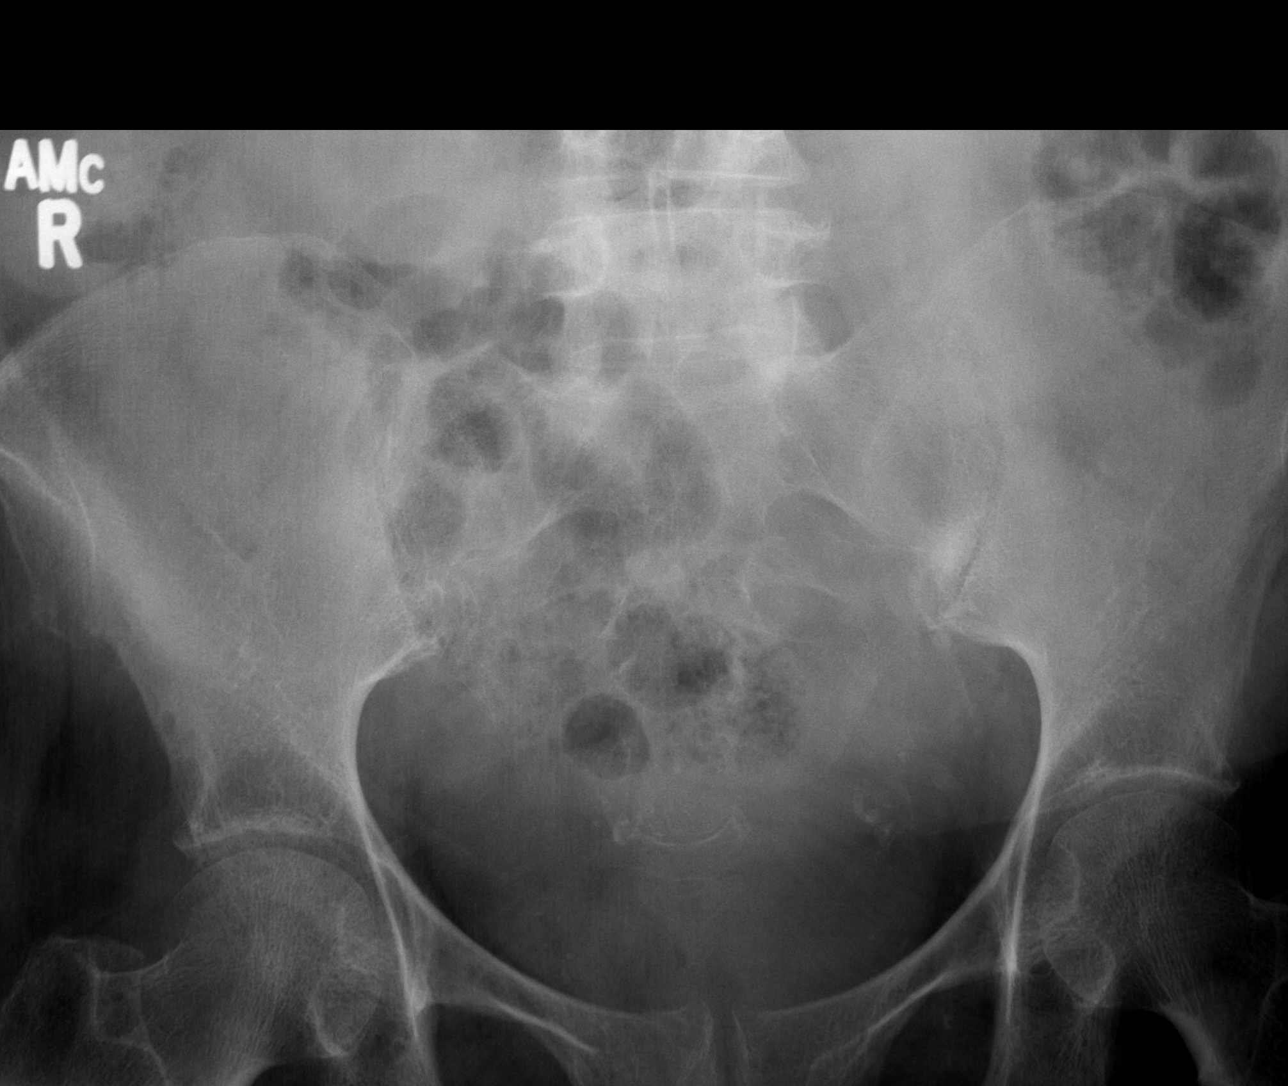

[t coccyx a.p.]
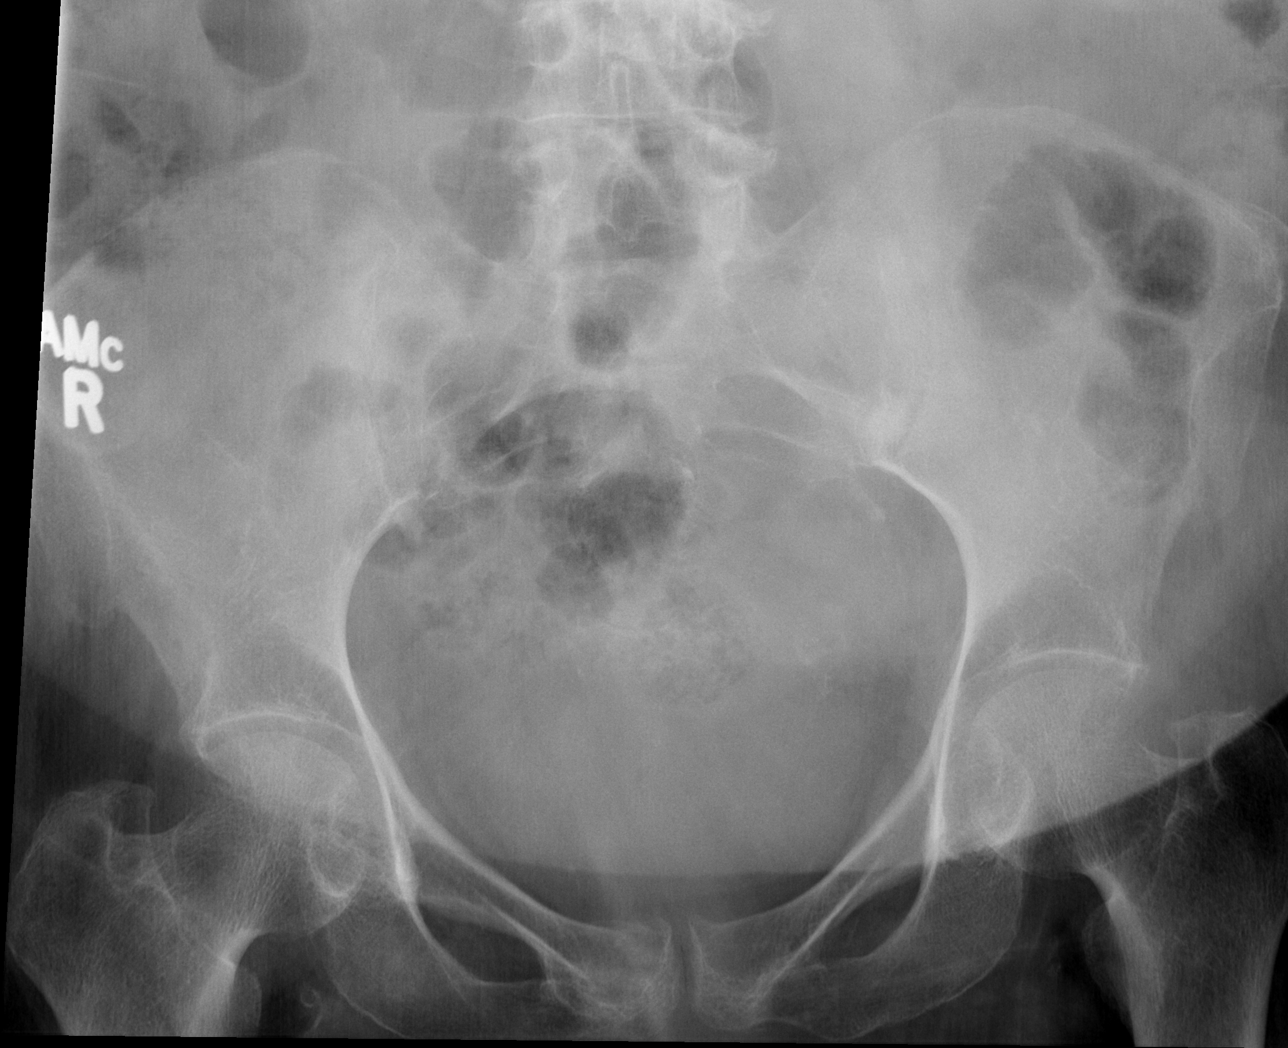

[t sacrum lat]
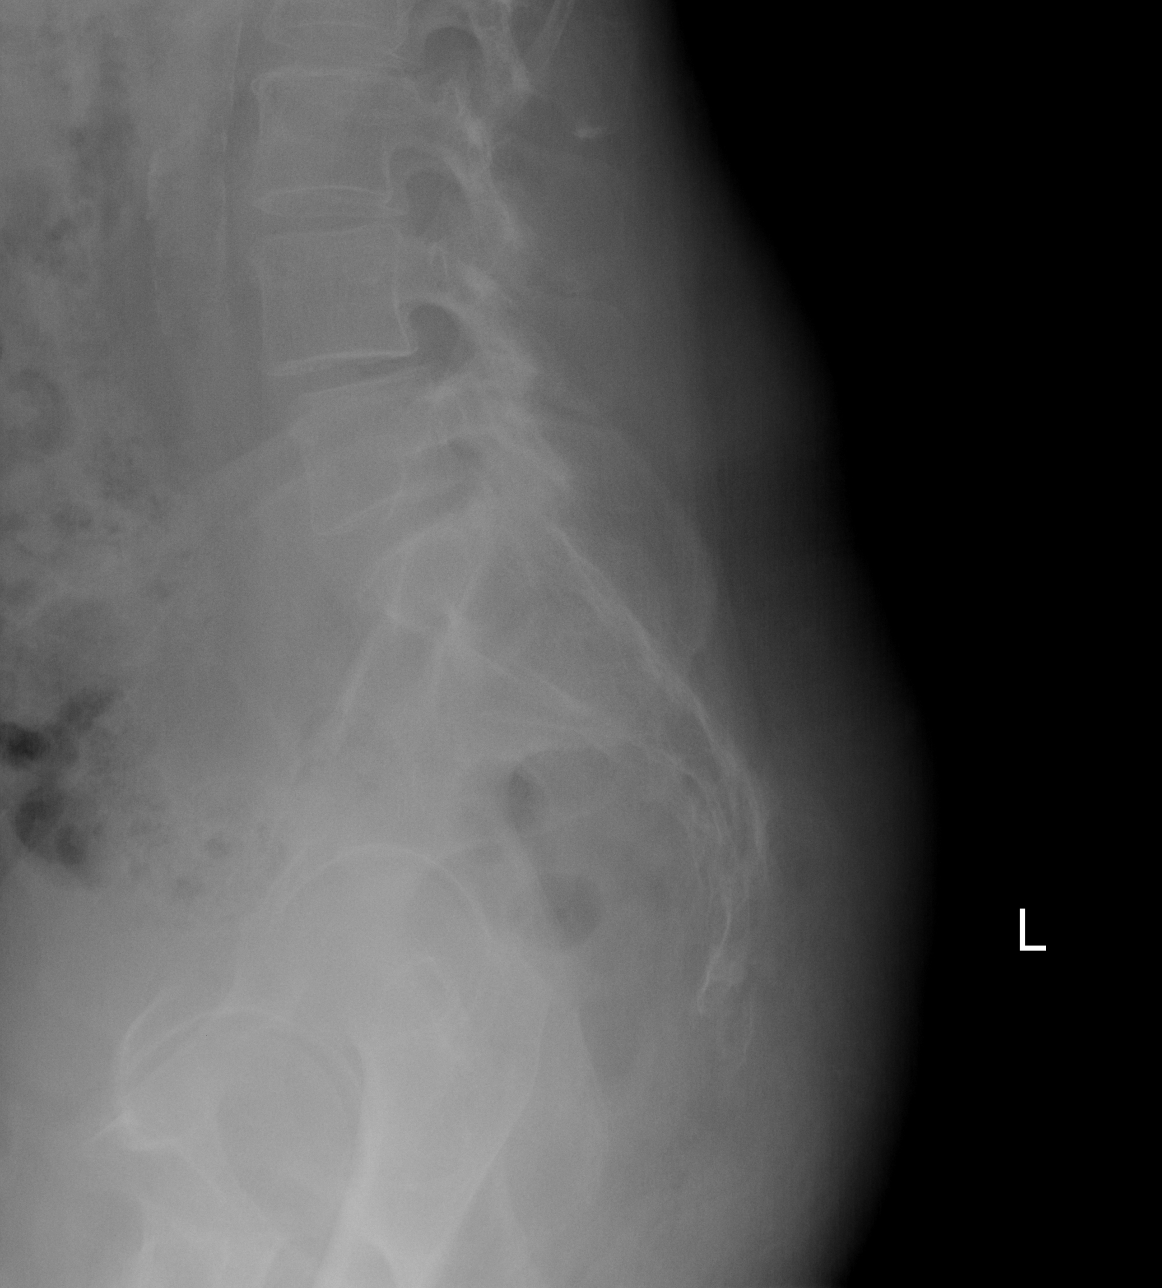

[3 of 3 positions shown; findings below may reference images not displayed]

FINDINGS: When allowing for obliquity there is no convincing fracture in the
lateral projection. Dorsal positioning of the coccyx is chronic
based on comparison CT.

Limited frontal projection due to overlapping stool and gas. No
evidence of diastasis.

Chronic spurring around the right pubic body. No fracture suspected
in this location.

Osteopenia.

L4-5 disc narrowing and slight anterolisthesis.
IMPRESSION: No acute finding.

## 2016-04-06 IMAGING — CR DG CHEST 2V
2 series · 2 of 2 positions shown · non-contrast
Comparison: 01/02/2016

CLINICAL DATA: c/o headache radiating down into her chest, pt
states she possibly had a seizure at home tonight. Pt also c/o a
yeast infection and now has uti symptoms with intermittent abdominal
pain, hx of htn, gerd, dm

EXAM:
CHEST  2 VIEW

[chest lat]
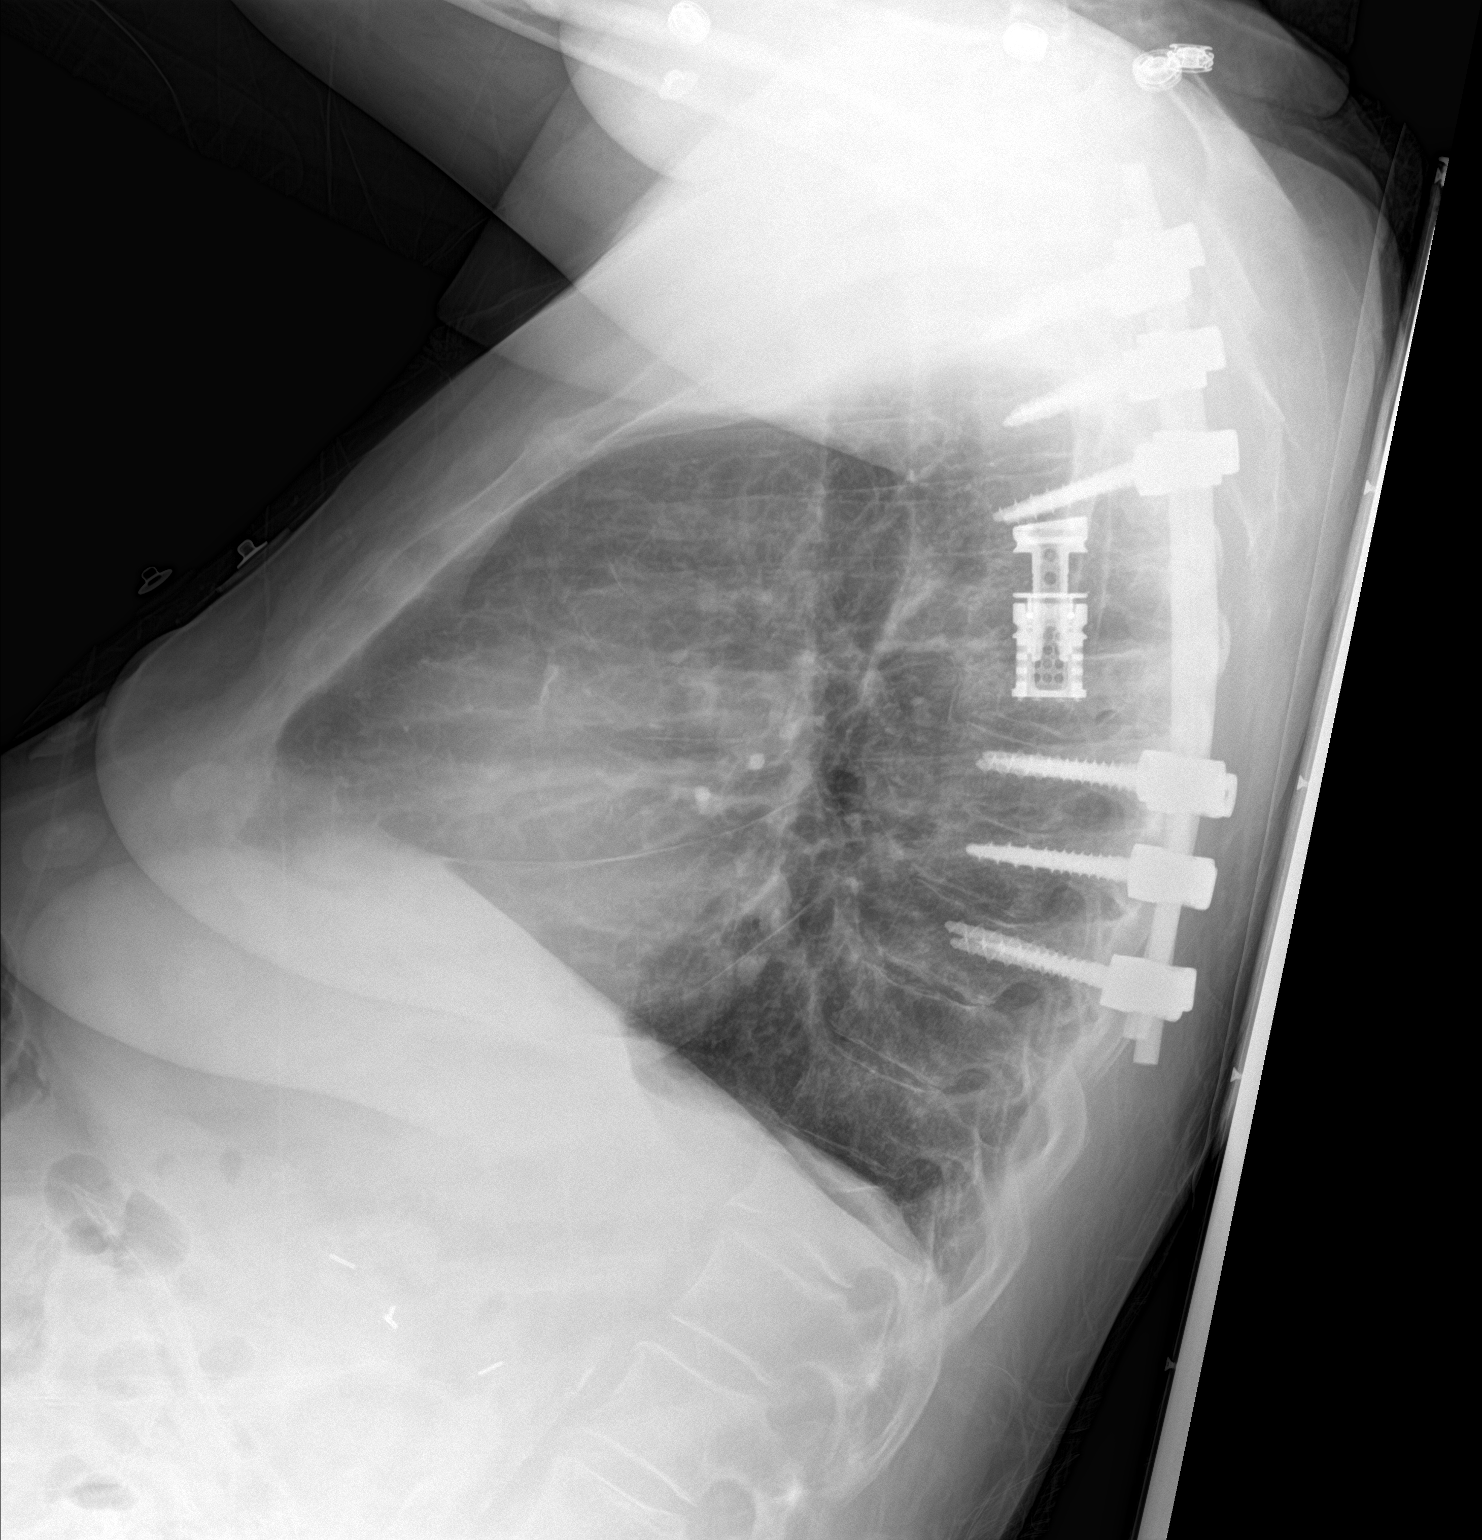

[chest ap]
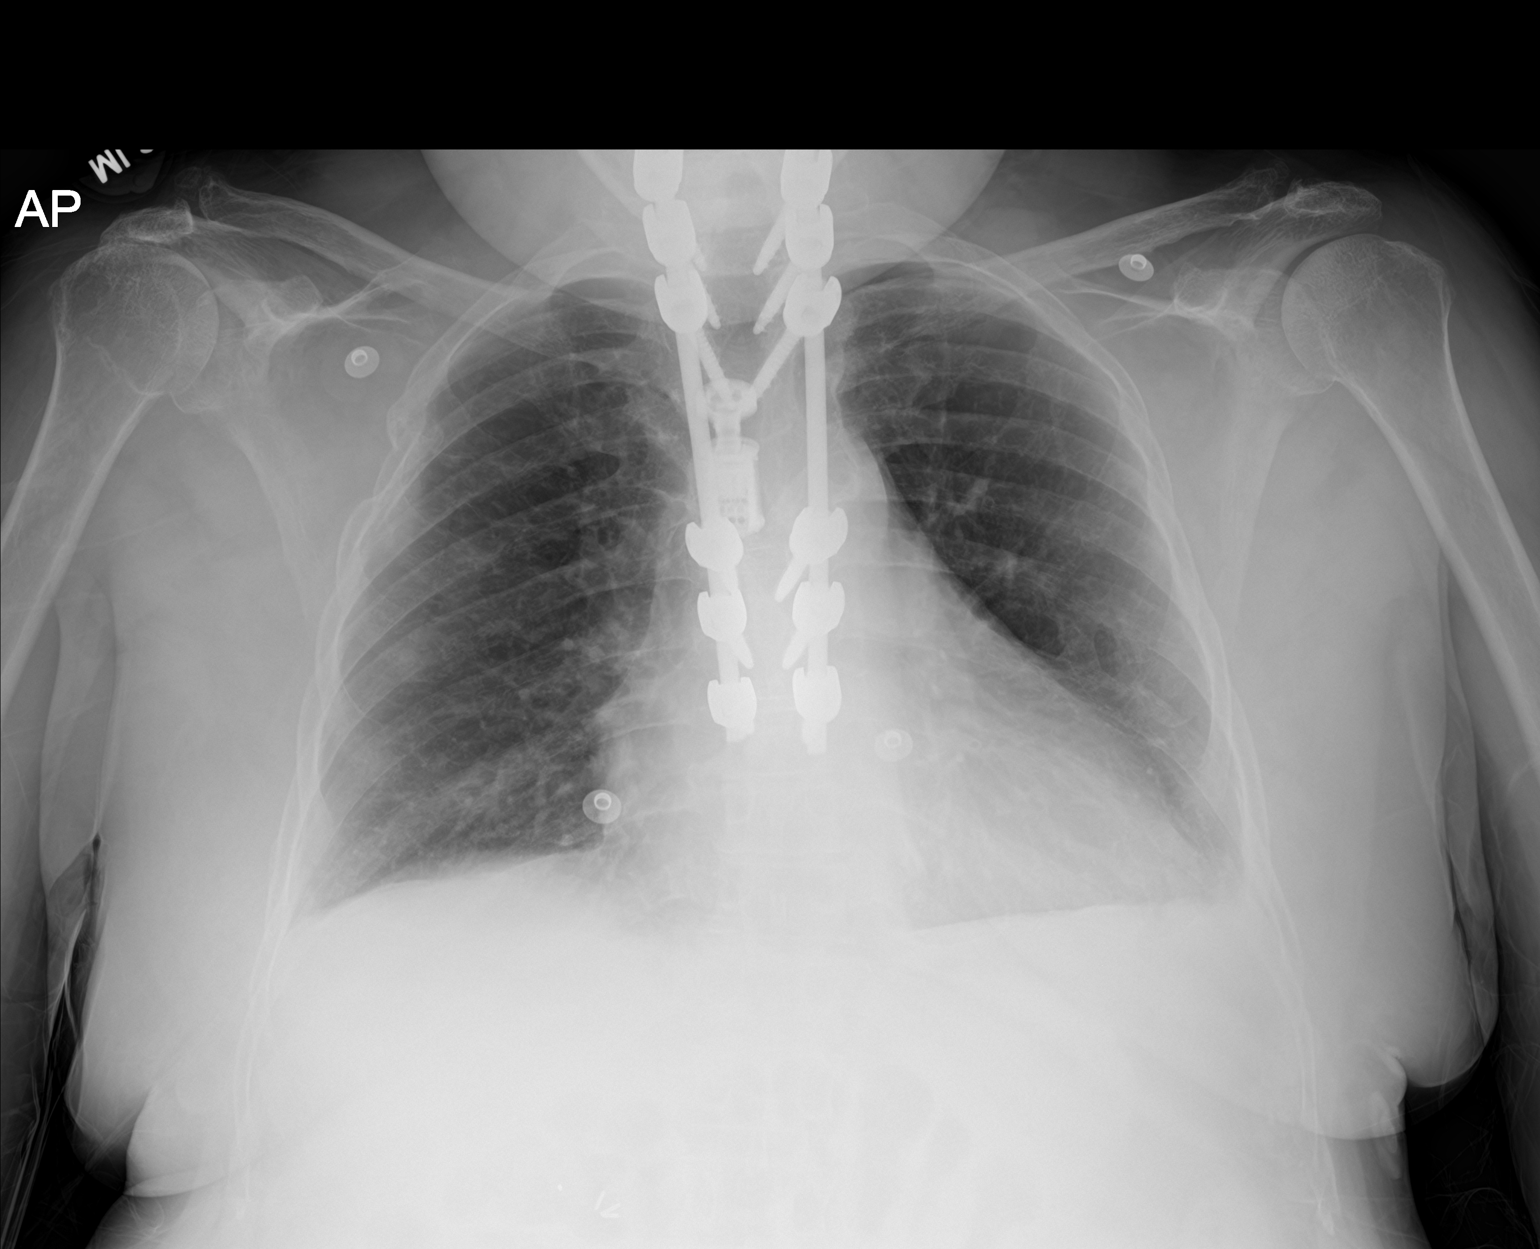

[2 of 2 positions shown; findings below may reference images not displayed]

FINDINGS: Thoracic spine fixation. Vertebral body height loss at the T12 level
is felt to be similar to the 01/25/2015 CT. Remote right rib trauma.
Midline trachea. Mild cardiomegaly. Left pleural thickening,
blunting the left costophrenic angle. No congestive failure. Clear
lungs.
IMPRESSION: No acute cardiopulmonary disease.

Cardiomegaly without congestive failure.

Thoracic spine fixation with chronic T12 compression deformity.

## 2016-04-15 IMAGING — CT CT HEAD W/O CM
2 of 3 series · 16 of 30 positions shown, 19 images · non-contrast
Comparison: July 26, 2015

CLINICAL DATA: Pain following fall 1 day prior.  Lethargy.

EXAM:
CT HEAD WITHOUT CONTRAST
TECHNIQUE: Contiguous axial images were obtained from the base of the skull
through the vertex without intravenous contrast.

[Series 2: head wo · axial · 0.41mm/px · z∈[-158,-32]mm · 10 of 33 slices shown, 13 images]
[im 3/33  brain]
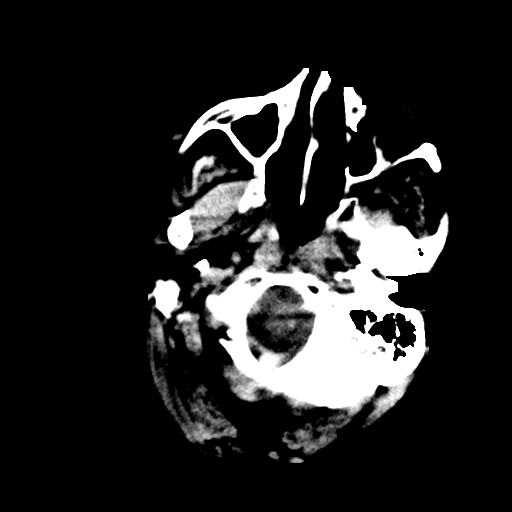
[im 3/33  bone]
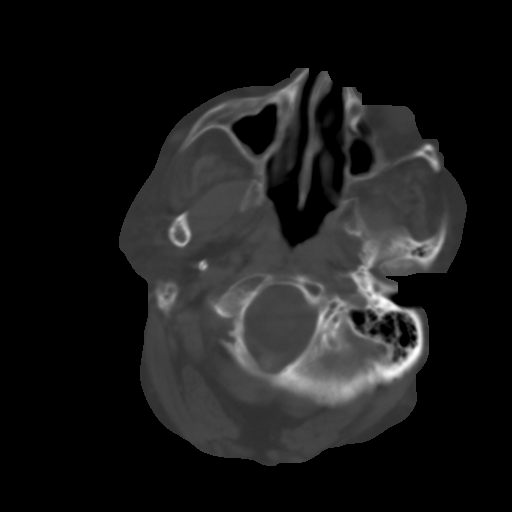
[im 6/33  brain]
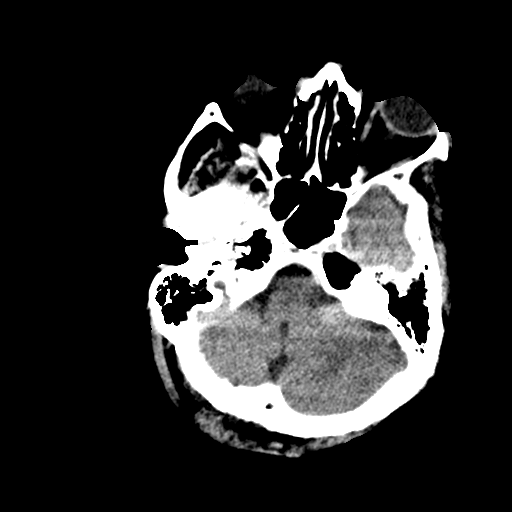
[im 9/33  brain]
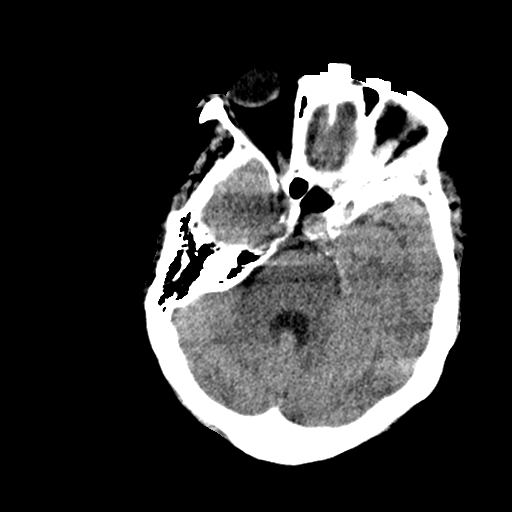
[im 12/33  brain]
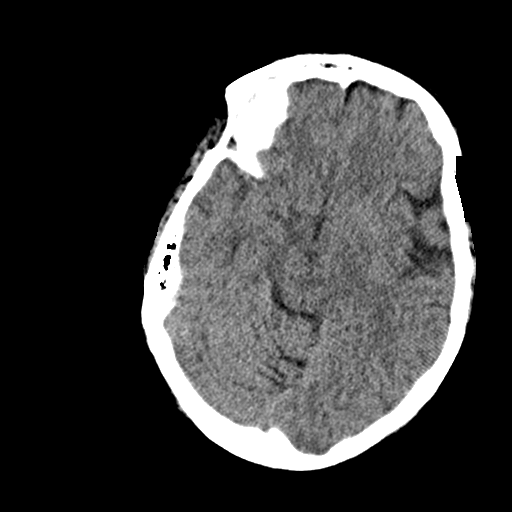
[im 15/33  brain]
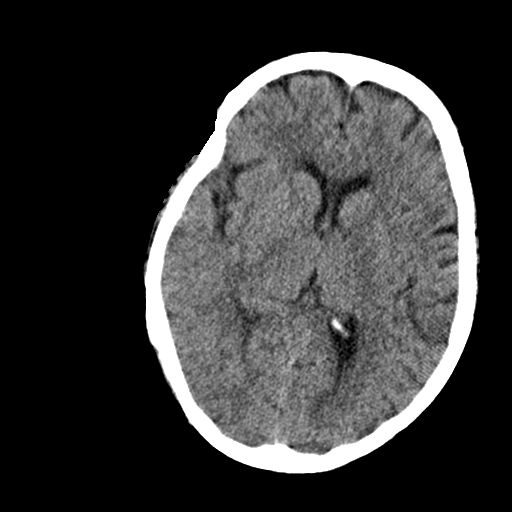
[im 15/33  bone]
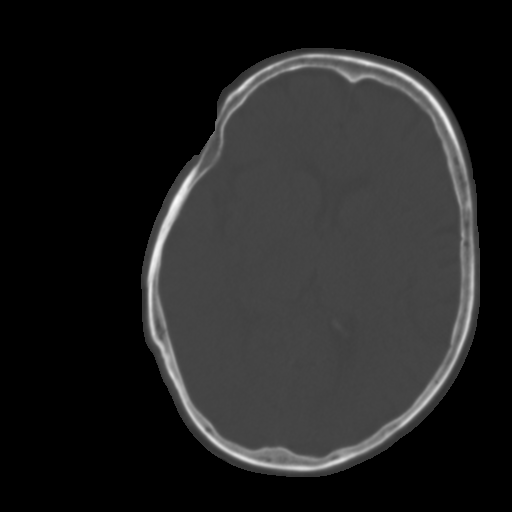
[im 18/33  brain]
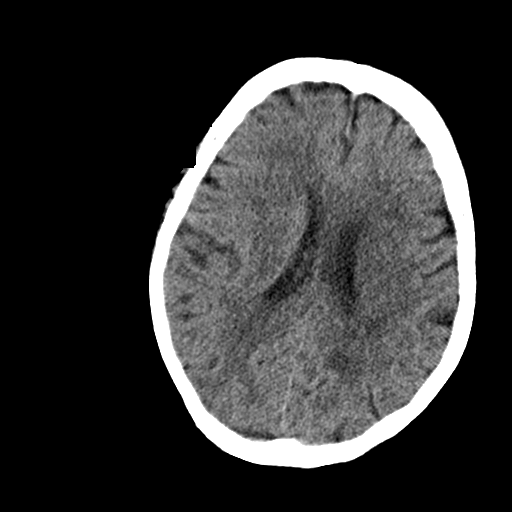
[im 21/33  brain]
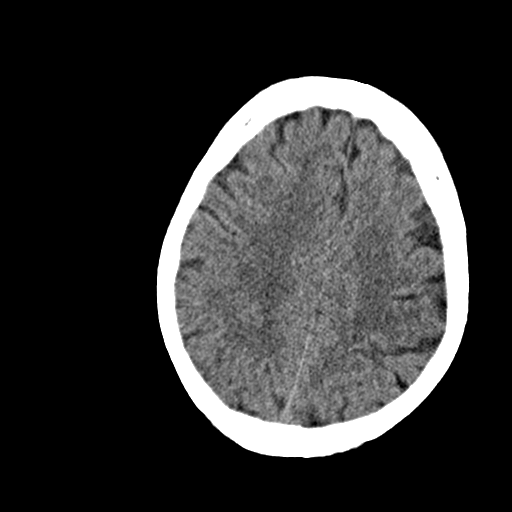
[im 24/33  brain]
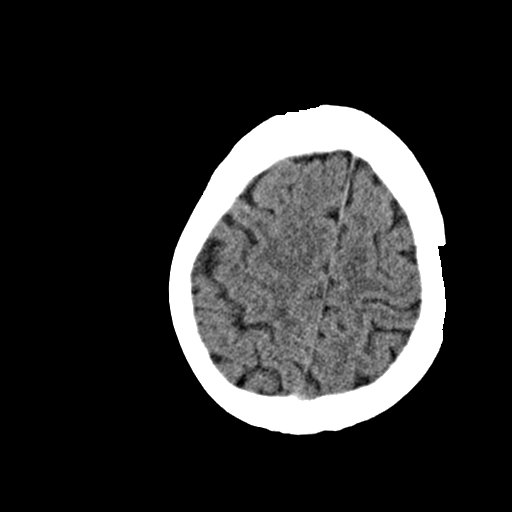
[im 27/33  brain]
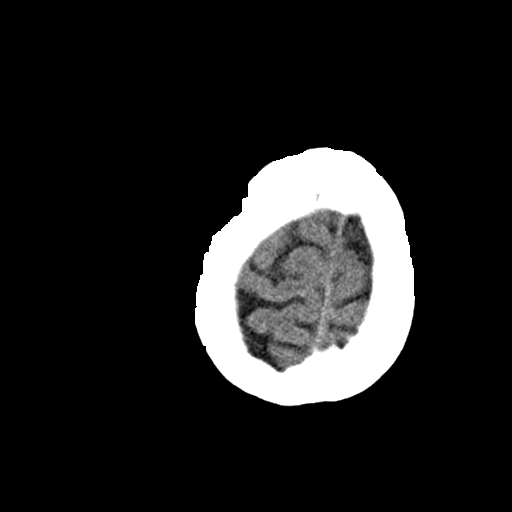
[im 27/33  bone]
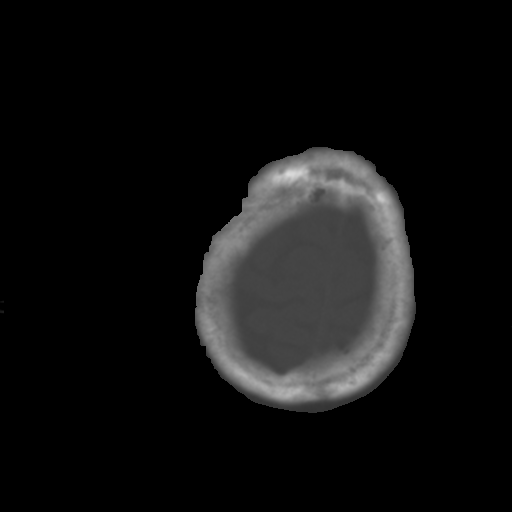
[im 30/33  brain]
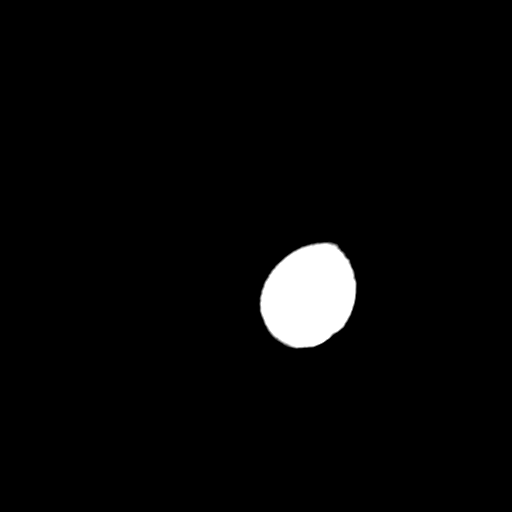

[Series 5: head bone · axial · 0.41mm/px · z∈[-151,-114]mm · 6 of 30 slices shown]
[im 3/30  bone]
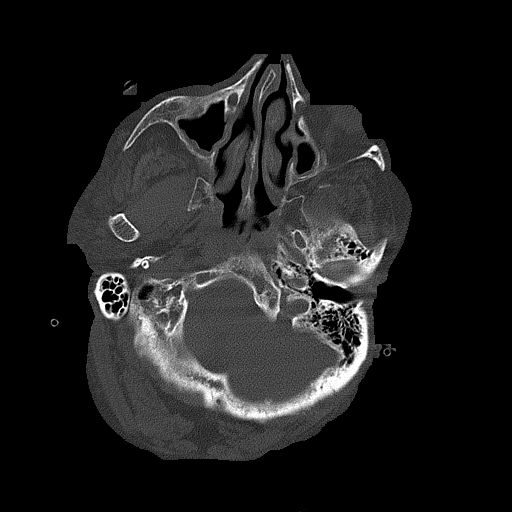
[im 6/30  bone]
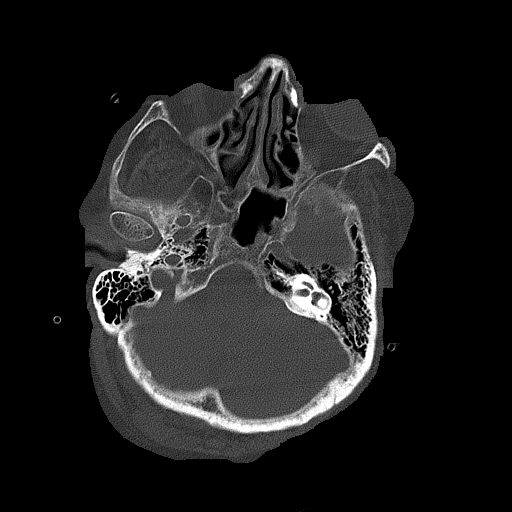
[im 11/30  bone]
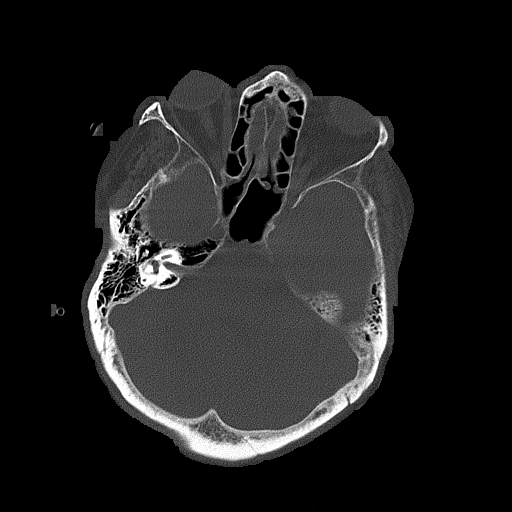
[im 14/30  bone]
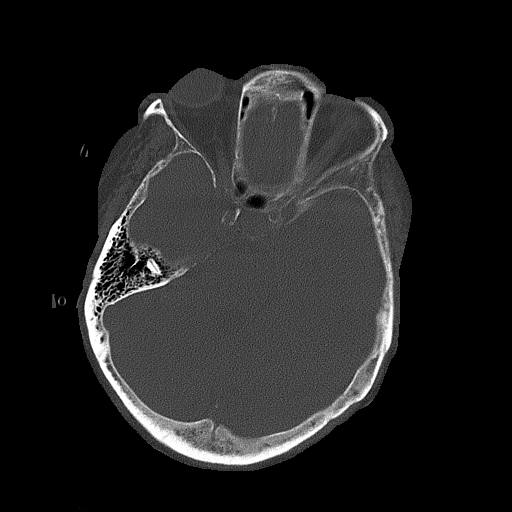
[im 16/30  bone]
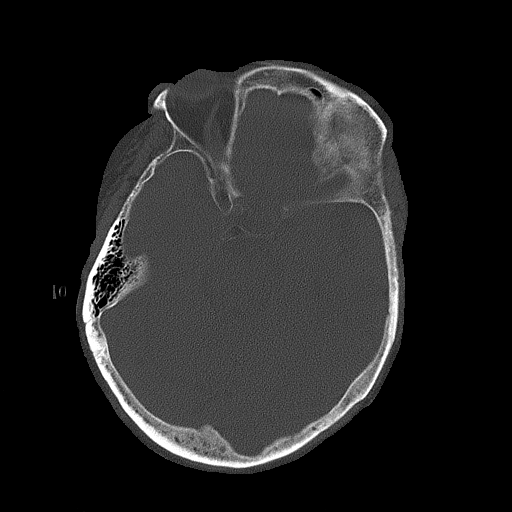
[im 19/30  bone]
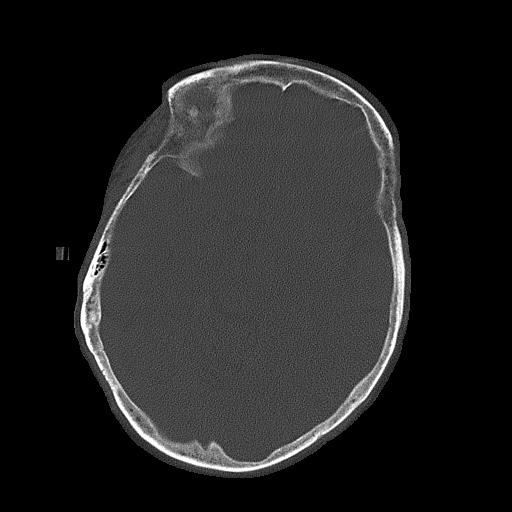

[16 of 30 positions shown; findings below may reference images not displayed]

FINDINGS: The ventricles are normal in size and configuration. There is no
intracranial mass, hemorrhage, extra-axial fluid collection, or
midline shift. Patchy small vessel disease in the centra semiovale
bilaterally is stable. There is no new gray-white compartment
lesion. No acute infarct is evident on this study. Bony calvarium
appears intact. The mastoid air cells are clear. There is mucosal
thickening in each maxillary antrum. No intraorbital lesions are
evident.
IMPRESSION: Stable patchy periventricular small vessel disease. No intracranial
mass, hemorrhage, or extra-axial fluid collection. No acute
appearing infarct. Mild maxillary sinus disease bilaterally.

## 2016-04-15 IMAGING — DX DG CHEST 2V
2 series · 2 of 2 positions shown · non-contrast
Comparison: February 19, 2016

CLINICAL DATA: Pain following fall

EXAM:
CHEST  2 VIEW

[chest lat]
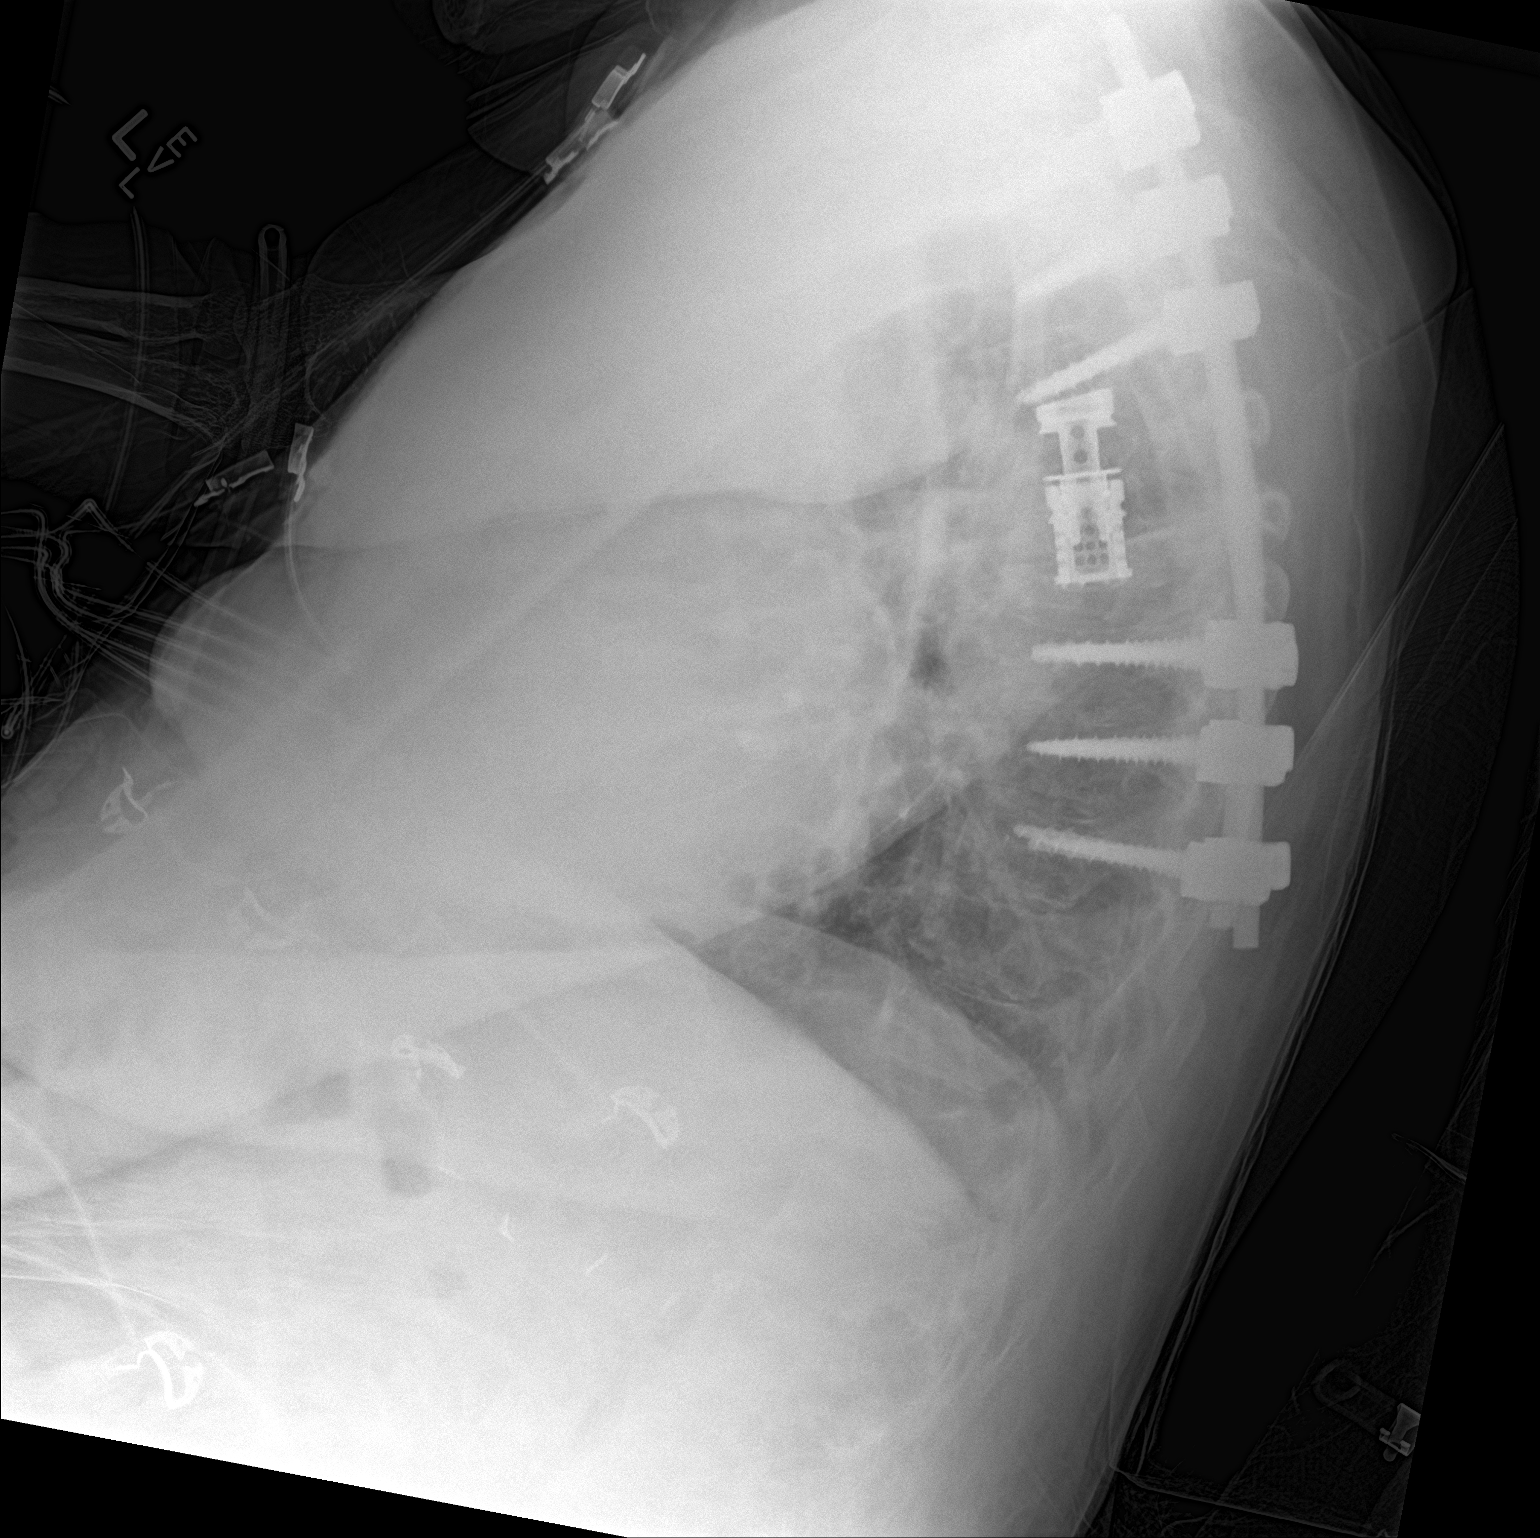

[chest ap]
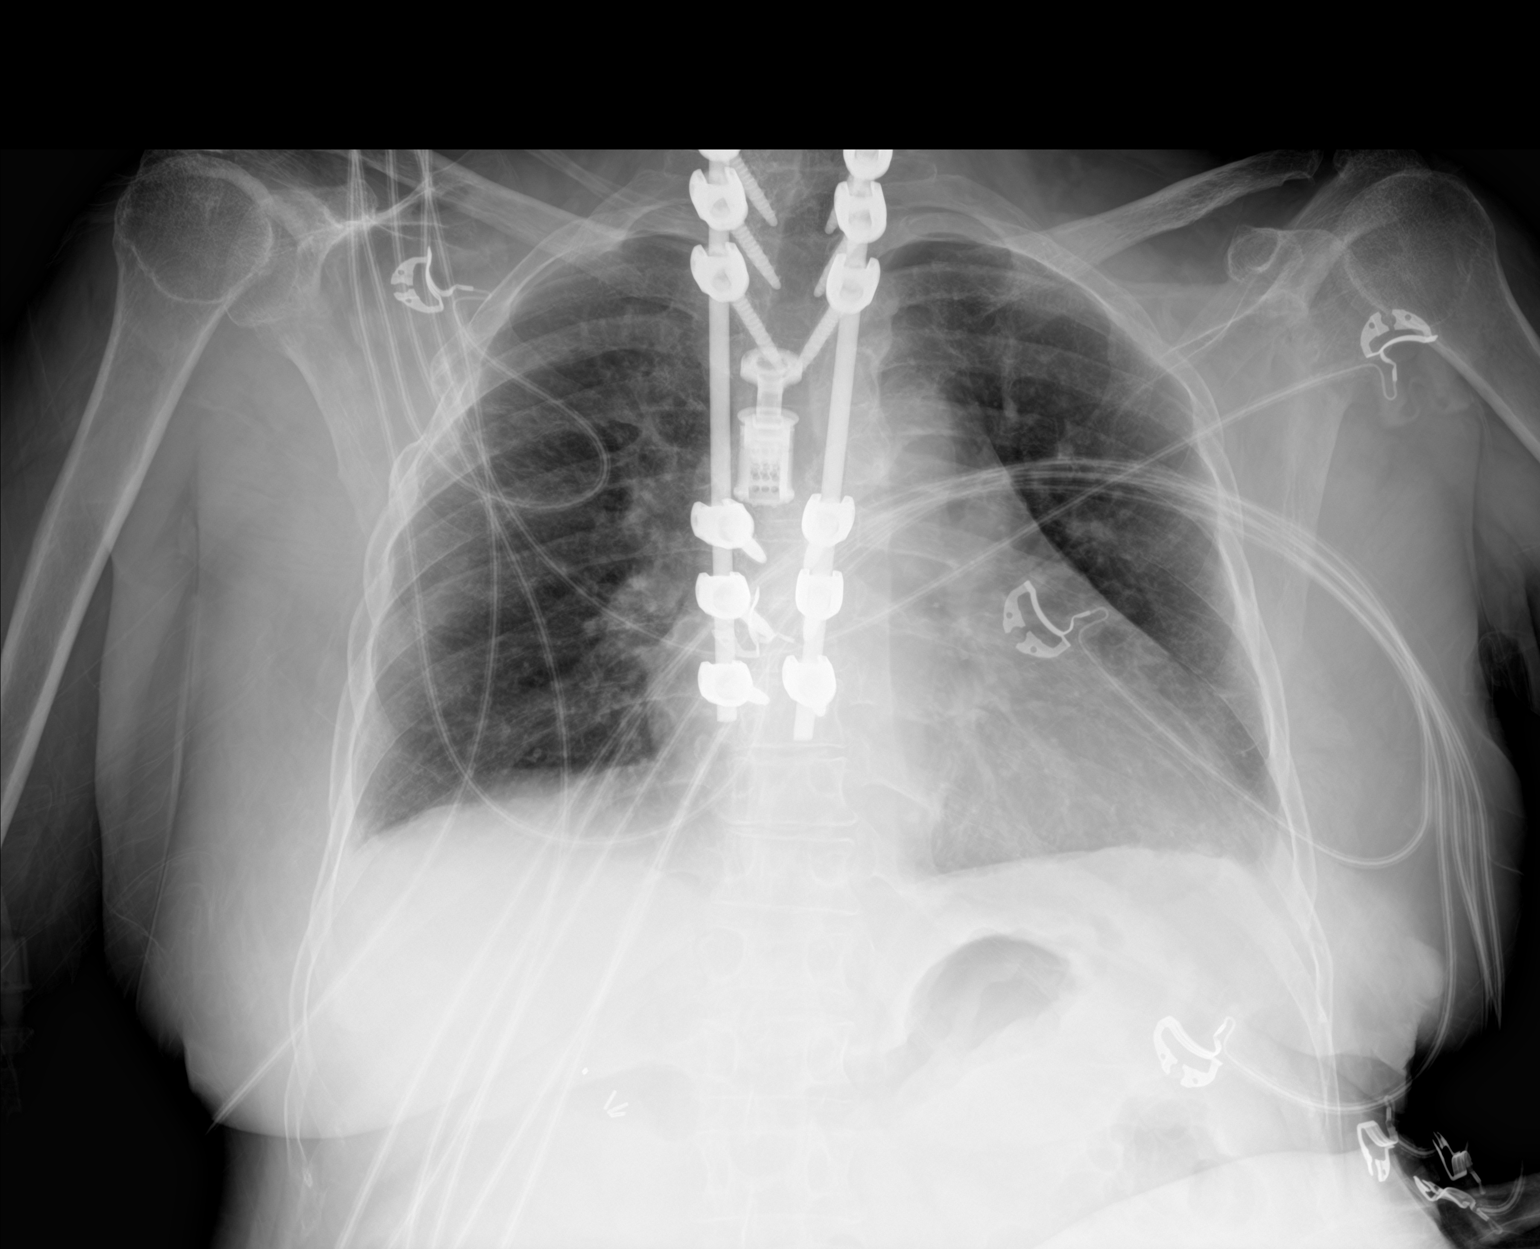

[2 of 2 positions shown; findings below may reference images not displayed]

FINDINGS: Lungs are clear. Heart is upper normal in size with pulmonary
vascularity within normal limits. There is extensive postoperative
change throughout the thoracic spine, stable. There old healed rib
fractures on the right, stable. No adenopathy evident. No
pneumothorax.
IMPRESSION: Extensive postoperative change throughout much of the thoracic
spine. Old rib trauma on the right. No edema or consolidation.
Stable cardiac silhouette.

## 2016-04-15 IMAGING — DX DG WRIST COMPLETE 3+V*L*
4 series · 4 of 4 positions shown · non-contrast
Comparison: None.

CLINICAL DATA: Fall with left wrist injury and pain. Initial
encounter.

EXAM:
LEFT WRIST - COMPLETE 3+ VIEW

[wrist pa]
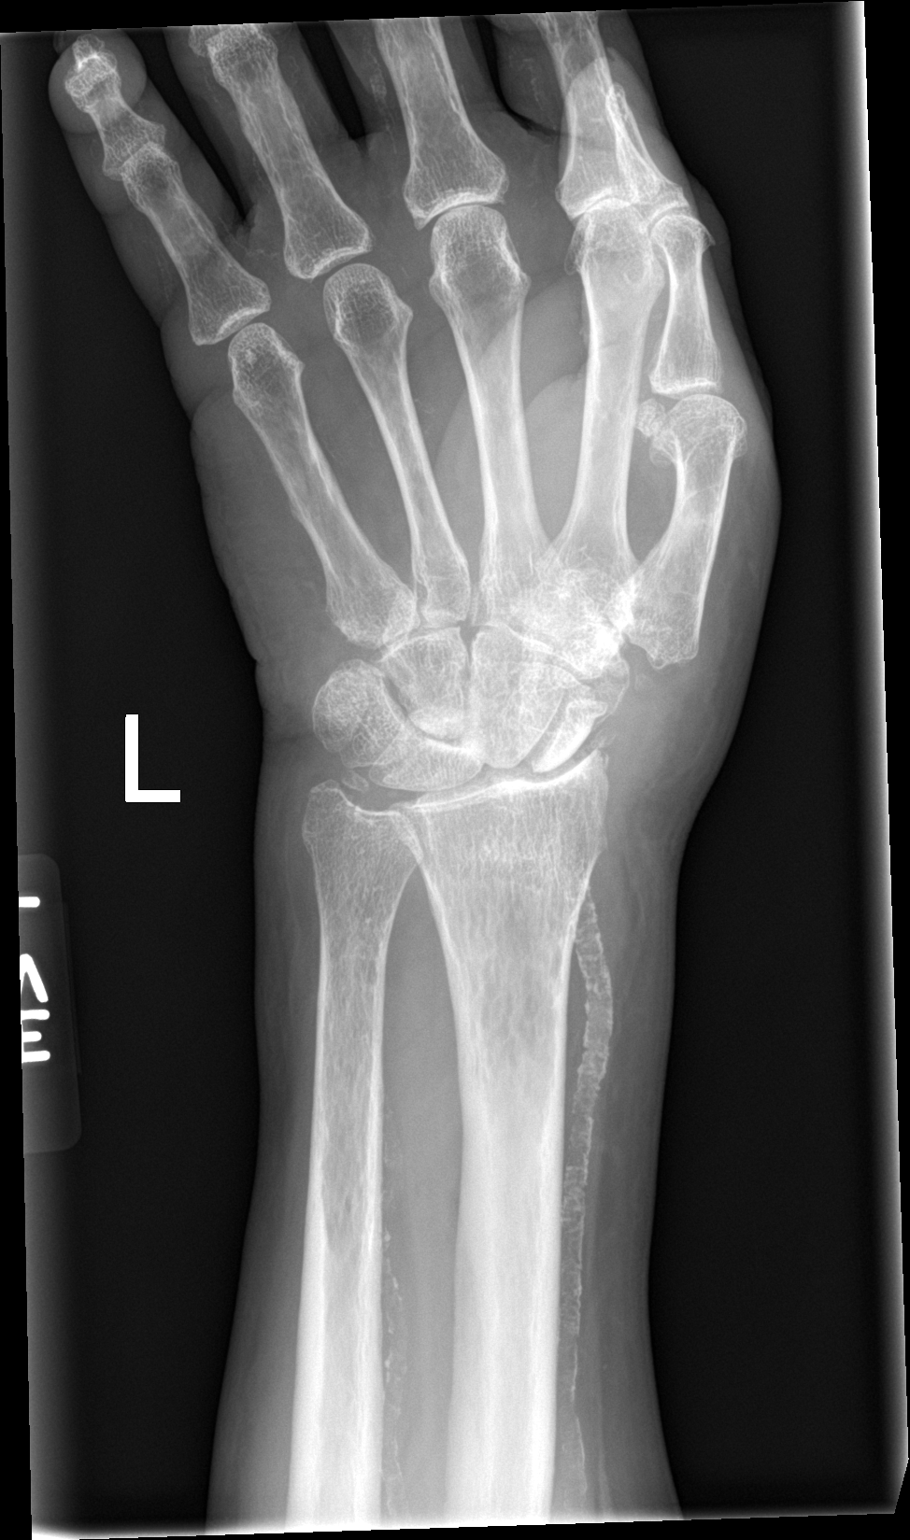

[wrist obl]
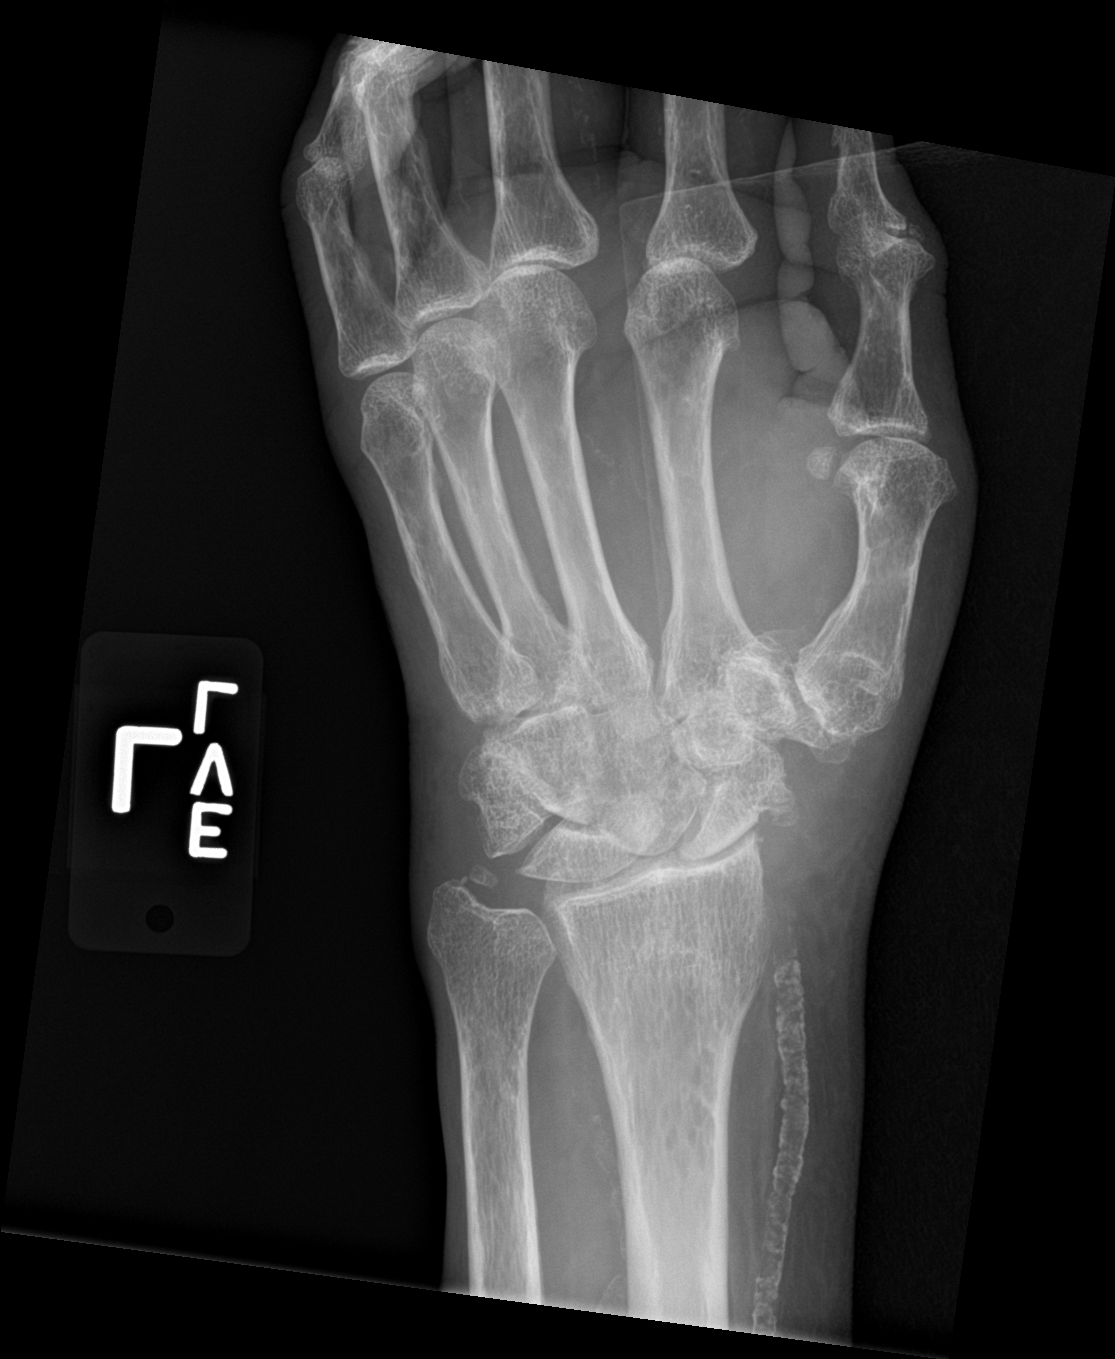

[wrist lat]
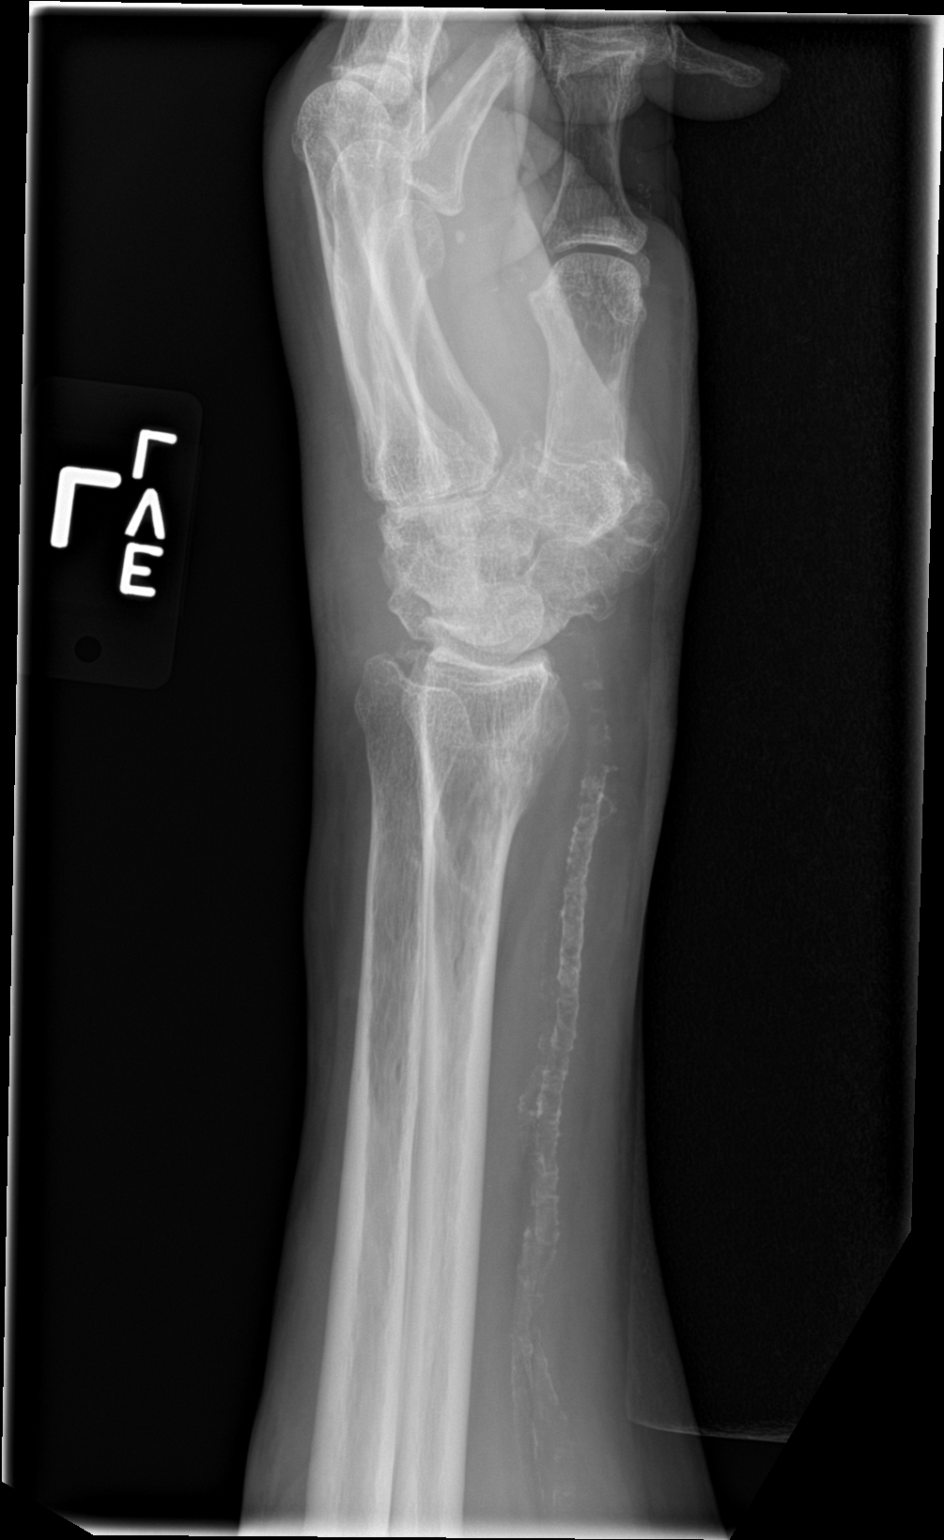

[wrist navicular]
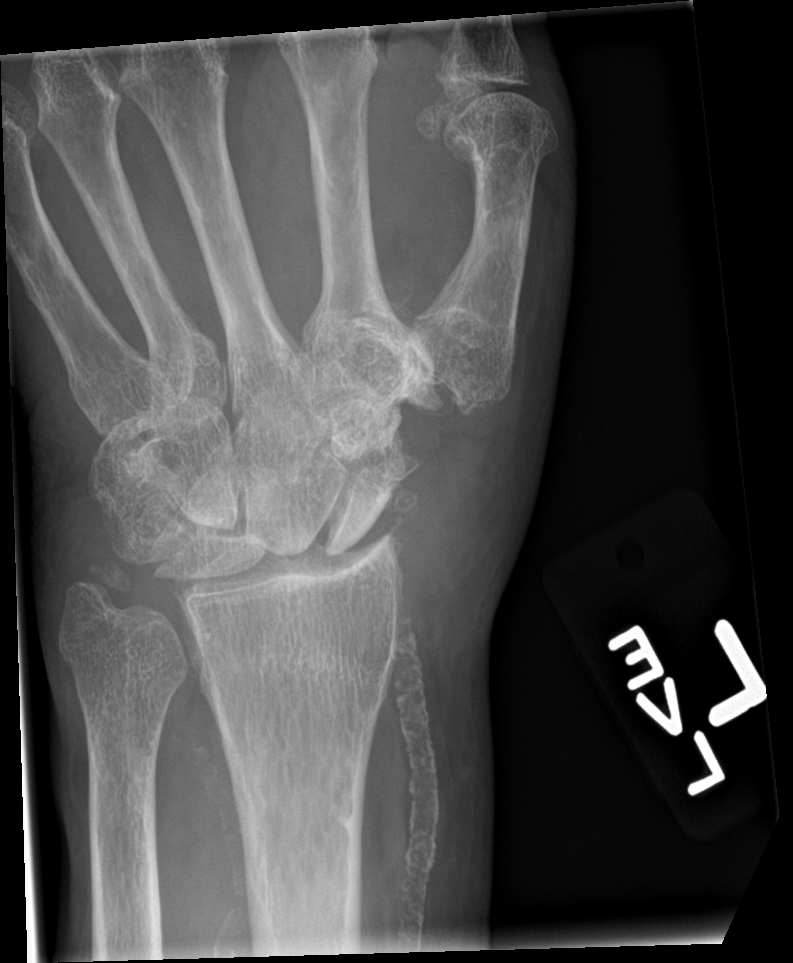

[4 of 4 positions shown; findings below may reference images not displayed]

FINDINGS: No acute fracture or dislocation identified. There are advanced
degenerative changes involving the carpal bones, radiocarpal joint
and multiple CMC joints. Distal ulnar and radial arteries show
calcifications, likely reflecting diabetic arterial calcifications.
No bony lesions or erosions. Soft tissues are unremarkable.
IMPRESSION: No acute fracture identified.

## 2016-04-17 IMAGING — DX DG CHEST 2V
2 series · 2 of 2 positions shown · non-contrast
Comparison: 02/28/2016

CLINICAL DATA: Fever starting [REDACTED], no chest complaints

EXAM:
CHEST  2 VIEW

[x chest ap]
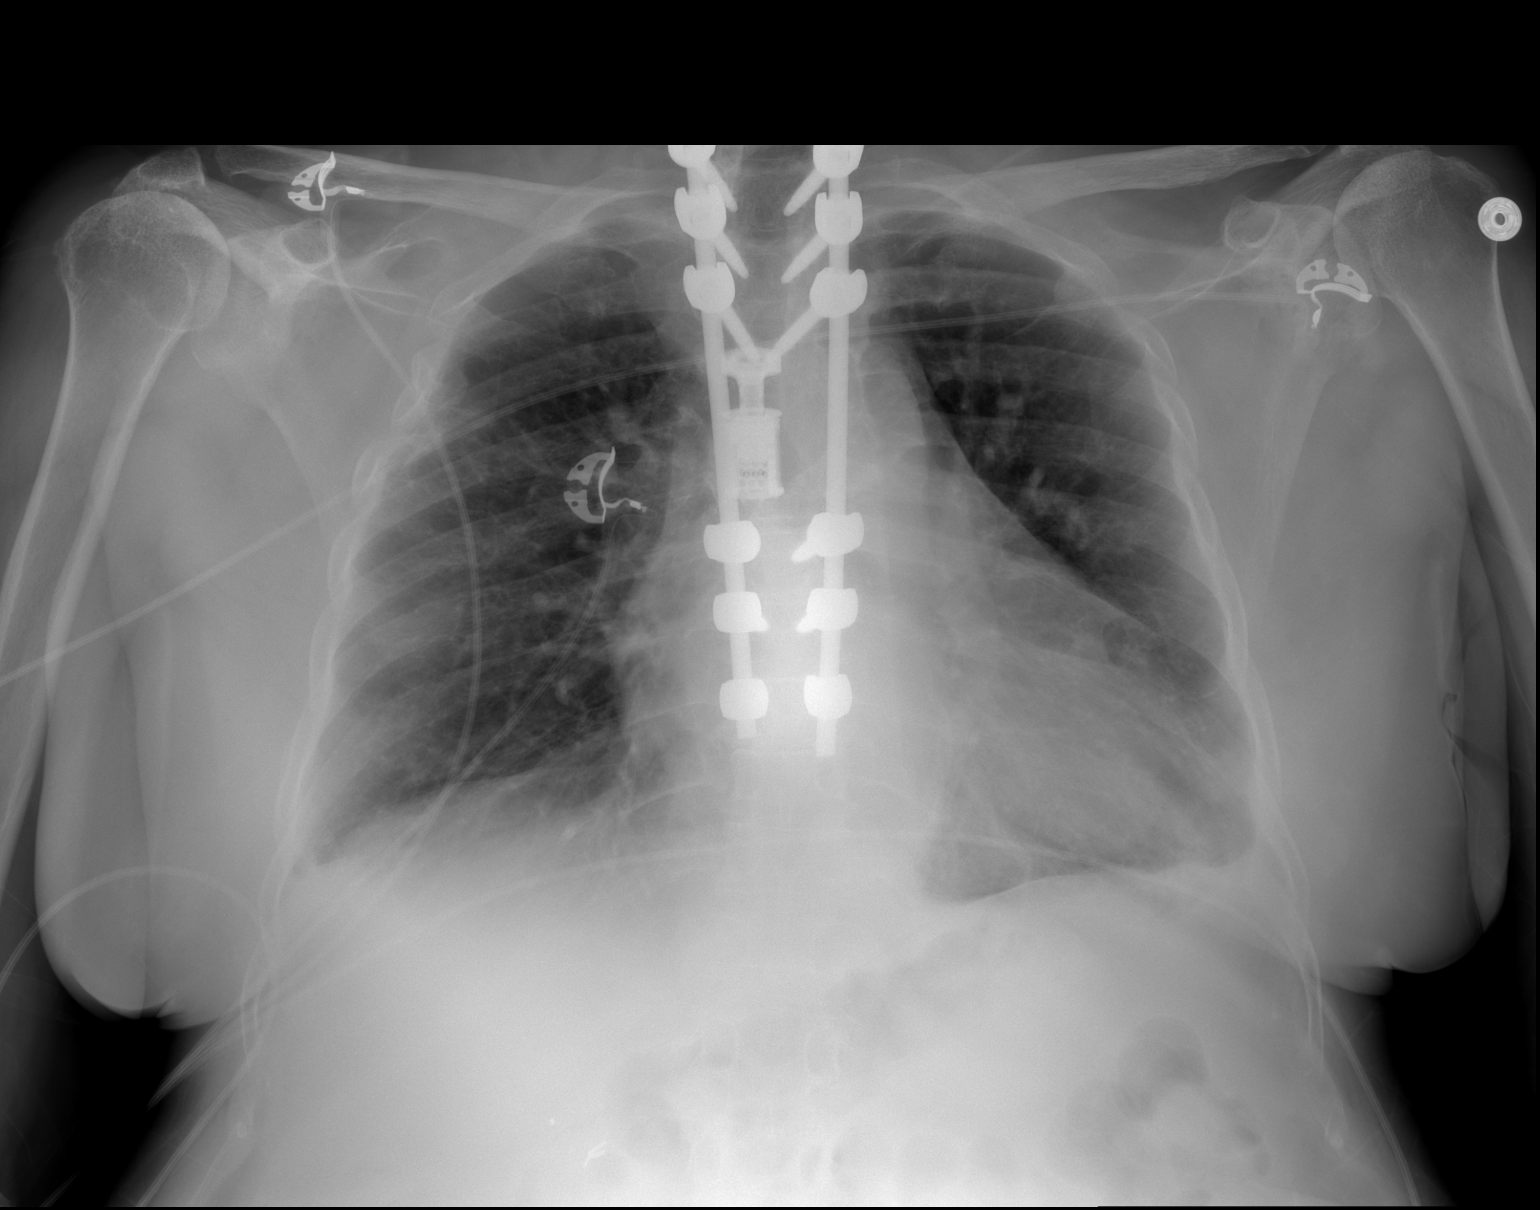

[w chest lat]
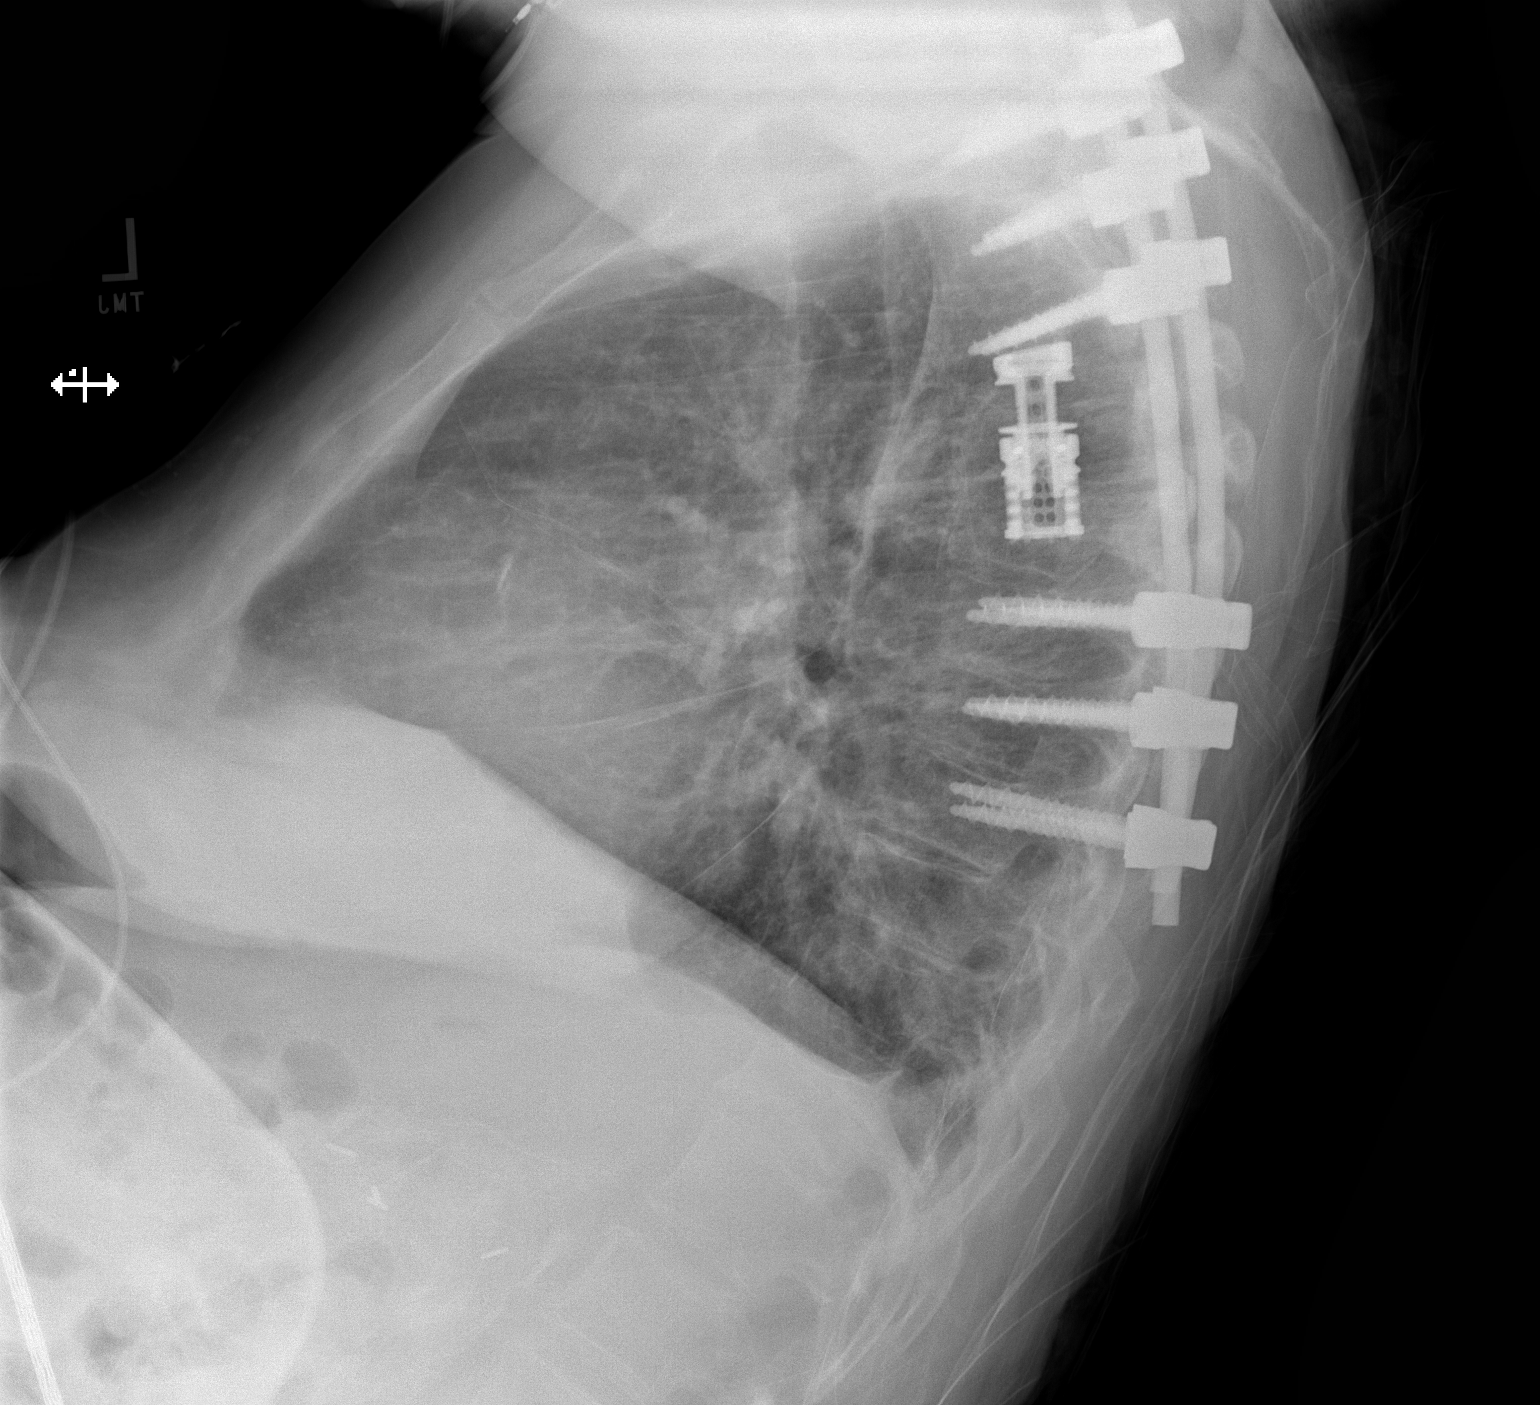

[2 of 2 positions shown; findings below may reference images not displayed]

FINDINGS: Cardiomediastinal silhouette is stable. Again noted posterior fusion
with metallic rods and transpedicular screws lower cervical/thoracic
spine. The alignment is preserved. Old right upper rib fractures are
stable. No acute infiltrate or pulmonary edema.
IMPRESSION: No active disease. Again noted metallic fixation rods lower cervical
and thoracic spine. The alignment is preserved. Cardiomegaly again
noted

## 2016-05-04 ENCOUNTER — Encounter (HOSPITAL_BASED_OUTPATIENT_CLINIC_OR_DEPARTMENT_OTHER): Payer: Self-pay | Admitting: *Deleted

## 2016-05-04 ENCOUNTER — Emergency Department (HOSPITAL_BASED_OUTPATIENT_CLINIC_OR_DEPARTMENT_OTHER): Payer: Medicaid Other

## 2016-05-04 ENCOUNTER — Emergency Department (HOSPITAL_BASED_OUTPATIENT_CLINIC_OR_DEPARTMENT_OTHER)
Admission: EM | Admit: 2016-05-04 | Discharge: 2016-05-04 | Disposition: A | Discharge status: Home / Self Care | Payer: Medicaid Other | Attending: Emergency Medicine | Admitting: Emergency Medicine

## 2016-05-04 DIAGNOSIS — W1839XA Other fall on same level, initial encounter: Secondary | ICD-10-CM | POA: Diagnosis not present

## 2016-05-04 DIAGNOSIS — Z79899 Other long term (current) drug therapy: Secondary | ICD-10-CM | POA: Insufficient documentation

## 2016-05-04 DIAGNOSIS — Y92000 Kitchen of unspecified non-institutional (private) residence as  the place of occurrence of the external cause: Secondary | ICD-10-CM | POA: Diagnosis not present

## 2016-05-04 DIAGNOSIS — Z87891 Personal history of nicotine dependence: Secondary | ICD-10-CM | POA: Diagnosis not present

## 2016-05-04 DIAGNOSIS — R109 Unspecified abdominal pain: Secondary | ICD-10-CM | POA: Diagnosis not present

## 2016-05-04 DIAGNOSIS — Z7984 Long term (current) use of oral hypoglycemic drugs: Secondary | ICD-10-CM | POA: Insufficient documentation

## 2016-05-04 DIAGNOSIS — M1990 Unspecified osteoarthritis, unspecified site: Secondary | ICD-10-CM | POA: Insufficient documentation

## 2016-05-04 DIAGNOSIS — R079 Chest pain, unspecified: Secondary | ICD-10-CM | POA: Diagnosis not present

## 2016-05-04 DIAGNOSIS — M25512 Pain in left shoulder: Secondary | ICD-10-CM | POA: Diagnosis not present

## 2016-05-04 DIAGNOSIS — Y999 Unspecified external cause status: Secondary | ICD-10-CM | POA: Insufficient documentation

## 2016-05-04 DIAGNOSIS — M549 Dorsalgia, unspecified: Secondary | ICD-10-CM

## 2016-05-04 DIAGNOSIS — E119 Type 2 diabetes mellitus without complications: Secondary | ICD-10-CM | POA: Diagnosis not present

## 2016-05-04 DIAGNOSIS — M545 Low back pain: Secondary | ICD-10-CM | POA: Insufficient documentation

## 2016-05-04 DIAGNOSIS — J45909 Unspecified asthma, uncomplicated: Secondary | ICD-10-CM | POA: Diagnosis not present

## 2016-05-04 DIAGNOSIS — M546 Pain in thoracic spine: Secondary | ICD-10-CM | POA: Diagnosis not present

## 2016-05-04 DIAGNOSIS — Y939 Activity, unspecified: Secondary | ICD-10-CM | POA: Diagnosis not present

## 2016-05-04 DIAGNOSIS — M25511 Pain in right shoulder: Secondary | ICD-10-CM | POA: Insufficient documentation

## 2016-05-04 DIAGNOSIS — I10 Essential (primary) hypertension: Secondary | ICD-10-CM | POA: Diagnosis not present

## 2016-05-04 DIAGNOSIS — Z7982 Long term (current) use of aspirin: Secondary | ICD-10-CM | POA: Insufficient documentation

## 2016-05-04 DIAGNOSIS — G8929 Other chronic pain: Secondary | ICD-10-CM

## 2016-05-04 DIAGNOSIS — R52 Pain, unspecified: Secondary | ICD-10-CM

## 2016-05-04 LAB — COMPREHENSIVE METABOLIC PANEL
ALBUMIN: 3.5 g/dL (ref 3.5–5.0)
ALK PHOS: 99 U/L (ref 38–126)
ALT: 25 U/L (ref 14–54)
ANION GAP: 10 (ref 5–15)
AST: 25 U/L (ref 15–41)
BUN: 13 mg/dL (ref 6–20)
CALCIUM: 8.7 mg/dL — AB (ref 8.9–10.3)
CO2: 22 mmol/L (ref 22–32)
Chloride: 108 mmol/L (ref 101–111)
Creatinine, Ser: 1 mg/dL (ref 0.44–1.00)
GFR calc non Af Amer: >60 mL/min (ref >60)
GLUCOSE: 171 mg/dL — AB (ref 65–99)
POTASSIUM: 3.3 mmol/L — AB (ref 3.5–5.1)
SODIUM: 140 mmol/L (ref 135–145)
TOTAL PROTEIN: 7.2 g/dL (ref 6.5–8.1)
Total Bilirubin: 0.6 mg/dL (ref 0.3–1.2)

## 2016-05-04 LAB — URINALYSIS, ROUTINE W REFLEX MICROSCOPIC
BILIRUBIN URINE: NEGATIVE
Glucose, UA: NEGATIVE mg/dL
HGB URINE DIPSTICK: NEGATIVE
KETONES UR: NEGATIVE mg/dL
Leukocytes, UA: NEGATIVE
NITRITE: NEGATIVE
Protein, ur: NEGATIVE mg/dL
SPECIFIC GRAVITY, URINE: 1.008 (ref 1.005–1.030)
pH: 7 (ref 5.0–8.0)

## 2016-05-04 LAB — CBC WITH DIFFERENTIAL/PLATELET
BASOS PCT: 0 %
Basophils Absolute: 0 10*3/uL (ref 0.0–0.1)
EOS ABS: 0 10*3/uL (ref 0.0–0.7)
EOS PCT: 0 %
HCT: 31.4 % — ABNORMAL LOW (ref 36.0–46.0)
Hemoglobin: 10.3 g/dL — ABNORMAL LOW (ref 12.0–15.0)
LYMPHS ABS: 1.1 10*3/uL (ref 0.7–4.0)
Lymphocytes Relative: 11 %
MCH: 24.5 pg — AB (ref 26.0–34.0)
MCHC: 32.8 g/dL (ref 30.0–36.0)
MCV: 74.8 fL — ABNORMAL LOW (ref 78.0–100.0)
MONO ABS: 0.4 10*3/uL (ref 0.1–1.0)
MONOS PCT: 4 %
NEUTROS PCT: 85 %
Neutro Abs: 8.9 10*3/uL — ABNORMAL HIGH (ref 1.7–7.7)
PLATELETS: 286 10*3/uL (ref 150–400)
RBC: 4.2 MIL/uL (ref 3.87–5.11)
RDW: 17.5 % — AB (ref 11.5–15.5)
WBC: 10.4 10*3/uL (ref 4.0–10.5)

## 2016-05-04 MED ORDER — DIAZEPAM 5 MG PO TABS: 5.0000 mg | ORAL_TABLET | Freq: Two times a day (BID) | ORAL | Status: DC

## 2016-05-04 MED ORDER — DIAZEPAM 5 MG/ML IJ SOLN
5.0000 mg | Freq: Once | INTRAMUSCULAR | Status: AC
  Administered 2016-05-04: 5 mg via INTRAVENOUS
  Filled 2016-05-04: qty 2

## 2016-05-04 MED ORDER — HYDROMORPHONE HCL 1 MG/ML IJ SOLN
1.0000 mg | Freq: Once | INTRAMUSCULAR | Status: AC
  Administered 2016-05-04: 1 mg via INTRAVENOUS
  Filled 2016-05-04: qty 1

## 2016-05-04 NOTE — ED Provider Notes (Signed)
ua is negative,   Elbow xray is negative.  Pt counseled on results.   Rx for valium Meds ordered this encounter  Medications  . HYDROmorphone (DILAUDID) injection 1 mg    Sig:   . HYDROmorphone (DILAUDID) injection 1 mg    Sig:   . diazepam (VALIUM) injection 5 mg    Sig:   . diazepam (VALIUM) 5 MG tablet    Sig: Take 1 tablet (5 mg total) by mouth 2 (two) times daily.    Dispense:  14 tablet    Refill:  0    Order Specific Question:  Supervising Provider    Answer:  Eber Hong [[id number]]  An After Visit Summary was printed and given to the patient.  Lonia Skinner [place], PA-C 05/04/16 1614  Gwyneth Sprout, MD 05/04/16 2140

## 2016-05-04 NOTE — Discharge Instructions (Signed)

## 2016-05-04 NOTE — ED Notes (Signed)
Patient c/o bilateral shoulder and lower back pain due to falling on Wednesday and the previous Friday. She uses a walker and her walker slipped and made her fall

## 2016-05-04 NOTE — ED Notes (Signed)
Pt. Uses a walker to walk with.  Pt. Brought in by EMS  And does not have her walker here with her.

## 2016-05-04 NOTE — ED Provider Notes (Signed)
CSN: [id number]     Arrival date & time 05/04/16  1328 History   First MD Initiated Contact with Patient 05/04/16 1338     Chief Complaint  Patient presents with  . Back Pain     (Consider location/radiation/quality/duration/timing/severity/associated sxs/prior Treatment) Patient is a 60 y.o. female presenting with back pain. The history is provided by the patient.  Back Pain Location:  Thoracic spine Quality:  Aching Pain severity:  Severe Pain is:  Same all the time Onset quality:  Sudden Duration:  1 day Timing:  Constant Progression:  Worsening Chronicity:  Chronic Context: falling   Context comment:  Patient has a history of frequent falls and fell yesterday in her kitchen on her left side onto the concrete. She has balance issues and went to grab her walker which was not locked causing her to fall. Relieved by:  Nothing Worsened by:  Movement Ineffective treatments:  OTC medications Associated symptoms: abdominal pain, abdominal swelling and chest pain   Associated symptoms: no dysuria, no leg pain, no numbness, no paresthesias and no weakness   Associated symptoms comment:  Urine has looked dark and had a smell last several days Risk factors comment:  Diabetes, chronic pain, extensive back surgeries, asthma, hypertension, GERD   Past Medical History  Diagnosis Date  . Arthritis   . Asthma   . Hypertension   . Constipation   . GERD (gastroesophageal reflux disease)   . Diabetes mellitus without complication (HCC)   . Chronic pain   . Seizures (HCC)     last seizure March 2015  . Pneumonia 10/2015  . Falls frequently    Past Surgical History  Procedure Laterality Date  . Back surgery    . Cholecystectomy    . Appendectomy     Family History  Problem Relation Age of Onset  . Kidney failure Mother   . Hypertension Sister   . Hypertension Brother    Social History  Substance Use Topics  . Smoking status: Former Games developer  . Smokeless tobacco: Never Used  .  Alcohol Use: No     Comment: admits to 2 drinks/week   OB History    No data available     Review of Systems  Cardiovascular: Positive for chest pain.  Gastrointestinal: Positive for abdominal pain.  Genitourinary: Negative for dysuria.  Musculoskeletal: Positive for back pain.  Neurological: Negative for weakness, numbness and paresthesias.  All other systems reviewed and are negative.     Allergies  Ivp dye  Home Medications   Prior to Admission medications   Medication Sig Start Date End Date Taking? Authorizing Provider  albuterol (PROVENTIL HFA;VENTOLIN HFA) 108 (90 BASE) MCG/ACT inhaler Inhale 2 puffs into the lungs every 6 (six) hours as needed for wheezing or shortness of breath.    Historical Provider, MD  amLODipine (NORVASC) 5 MG tablet Take 5 mg by mouth daily.    Historical Provider, MD  aspirin EC 81 MG tablet Take 1 tablet (81 mg total) by mouth daily. 06/14/15   Richarda Overlie, MD  atorvastatin (LIPITOR) 10 MG tablet Take 10 mg by mouth daily. 08/13/15   Historical Provider, MD  cholecalciferol (VITAMIN D) 1000 units tablet Take 2,000 Units by mouth daily.    Historical Provider, MD  cyclobenzaprine (FLEXERIL) 10 MG tablet Take 1 tablet (10 mg total) by mouth 2 (two) times daily as needed for muscle spasms. Patient taking differently: Take 10 mg by mouth 2 (two) times daily.  01/31/16   Arthor Captain,  PA-C  dextromethorphan-guaiFENesin (MUCINEX DM) 30-600 MG 12hr tablet Take 1 tablet by mouth 2 (two) times daily. Patient taking differently: Take 1 tablet by mouth 2 (two) times daily as needed.  01/02/16   Vanetta Mulders, MD  diazepam (VALIUM) 5 MG tablet Take 0.5 tablets (2.5 mg total) by mouth every 12 (twelve) hours as needed for muscle spasms. 03/01/16   Richarda Overlie, MD  feeding supplement, ENSURE ENLIVE, (ENSURE ENLIVE) LIQD Take 237 mLs by mouth 2 (two) times daily between meals. 11/10/15   Catarina Hartshorn, MD  Fluticasone-Salmeterol (ADVAIR) 250-50 MCG/DOSE AEPB Inhale  1 puff into the lungs 2 (two) times daily.    Historical Provider, MD  folic acid (FOLVITE) 1 MG tablet Take 1 mg by mouth daily.    Historical Provider, MD  furosemide (LASIX) 40 MG tablet Take 1 tablet (40 mg total) by mouth daily. 06/14/15   Richarda Overlie, MD  gabapentin (NEURONTIN) 300 MG capsule Take 1 capsule (300 mg total) by mouth 2 (two) times daily. 06/14/15   Richarda Overlie, MD  levofloxacin (LEVAQUIN) 500 MG tablet Take 1 tablet (500 mg total) by mouth daily. 03/01/16   Richarda Overlie, MD  LINZESS 145 MCG CAPS capsule Take 145 mcg by mouth daily as needed (constipation).  08/13/15   Historical Provider, MD  metFORMIN (GLUCOPHAGE-XR) 500 MG 24 hr tablet Take 500 mg by mouth daily with breakfast.    Historical Provider, MD  metoprolol succinate (TOPROL-XL) 25 MG 24 hr tablet Take 25 mg by mouth daily.    Historical Provider, MD  oxyCODONE 10 MG TABS Take 1 tablet (10 mg total) by mouth every 4 (four) hours as needed for severe pain. 03/01/16   Richarda Overlie, MD  oxymorphone 20 MG T12A Take 20 mg by mouth every 12 (twelve) hours. 03/01/16   Richarda Overlie, MD  pantoprazole (PROTONIX) 40 MG tablet Take 1 tablet (40 mg total) by mouth 2 (two) times daily before a meal. 12/25/15   Ripudeep K Rai, MD  polyethylene glycol (MIRALAX / GLYCOLAX) packet Take 17 g by mouth 2 (two) times daily. 06/14/15   Richarda Overlie, MD  potassium chloride SA (K-DUR,KLOR-CON) 20 MEQ tablet Take 2 tablets (40 mEq total) by mouth daily. 01/27/15   Calvert Cantor, MD  predniSONE (DELTASONE) 10 MG tablet Take 15 mg by mouth daily with breakfast.    Historical Provider, MD  promethazine (PHENERGAN) 25 MG tablet Take 1 tablet by mouth every 6 (six) hours as needed for nausea or vomiting. nausea 10/06/15   Historical Provider, MD  tiZANidine (ZANAFLEX) 4 MG tablet Take 4 mg by mouth 4 (four) times daily. 08/13/15   Historical Provider, MD  Vitamin D, Ergocalciferol, (DRISDOL) 50000 UNITS CAPS capsule Take 1 capsule (50,000 Units total) by mouth  every 7 (seven) days. Sunday Patient taking differently: Take 50,000 Units by mouth every 7 (seven) days.  01/27/15   Calvert Cantor, MD  VITAMIN E PO Take 1 capsule by mouth daily.    Historical Provider, MD   BP 118/78 mmHg  Pulse 82  Temp(Src) 98.5 F (36.9 C) (Oral)  Resp 18  Ht 5' (1.524 m)  Wt 155 lb (70.308 kg)  BMI 30.27 kg/m2  SpO2 100% Physical Exam  Constitutional: She is oriented to person, place, and time. She appears well-developed and well-nourished. No distress.  HENT:  Head: Normocephalic and atraumatic.  Mouth/Throat: Oropharynx is clear and moist.  Eyes: Conjunctivae and EOM are normal. Pupils are equal, round, and reactive to light.  Neck:  Normal range of motion. Neck supple.  Cardiovascular: Normal rate, regular rhythm and intact distal pulses.   No murmur heard. Pulmonary/Chest: Effort normal and breath sounds normal. No respiratory distress. She has no wheezes. She has no rales. She exhibits tenderness. She exhibits no crepitus and no retraction.    Abdominal: Soft. She exhibits distension. There is generalized tenderness. There is no rebound and no guarding.  Musculoskeletal: Normal range of motion. She exhibits edema.       Thoracic back: She exhibits tenderness, bony tenderness, pain and spasm.       Back:  2+ pitting edema to the midshin bilaterally  Neurological: She is alert and oriented to person, place, and time.  Skin: Skin is warm and dry. No rash noted. No erythema.  Psychiatric: She has a normal mood and affect. Her behavior is normal.  Nursing note and vitals reviewed.   ED Course  Procedures (including critical care time) Labs Review Labs Reviewed  CBC WITH DIFFERENTIAL/PLATELET - Abnormal; Notable for the following:    Hemoglobin 10.3 (*)    HCT 31.4 (*)    MCV 74.8 (*)    MCH 24.5 (*)    RDW 17.5 (*)    Neutro Abs 8.9 (*)    All other components within normal limits  URINALYSIS, ROUTINE W REFLEX MICROSCOPIC (NOT AT [facility])    COMPREHENSIVE METABOLIC PANEL    Imaging Review Ct Abdomen Pelvis Wo Contrast  05/04/2016  CLINICAL DATA:  Bilateral shoulder pain, low back pain, fell on Wednesday and last Friday EXAM: CT CHEST, ABDOMEN AND PELVIS WITHOUT CONTRAST TECHNIQUE: Multidetector CT imaging of the chest, abdomen and pelvis was performed following the standard protocol without IV contrast. COMPARISON:  None. FINDINGS: CT CHEST FINDINGS Mediastinum/Lymph Nodes: Images of the thoracic inlet are unremarkable. Central airways are patent. Atherosclerotic calcifications of thoracic aorta and coronary arteries. No aortic aneurysm. No mediastinal hematoma or adenopathy. Heart size within normal limits. No pericardial effusion. Lungs/Pleura: Images of the lung parenchyma shows no acute infiltrate or pulmonary edema. Stable scarring in right lower lobe posterior medially prior spinal. Stable bilateral lower lobe anterolateral peripheral scarring. There is no lung contusion or pneumothorax. No segmental consolidation. Musculoskeletal: Stable old fracture deformity of the right third and fourth rib. Again noted postsurgical changes with T4 and T5 corpectomy and solid fusion. The alignment is preserved. Stable remote compression fracture of T11 vertebral body. Sagittal images of the spine shows no acute fracture is. Sagittal view of the sternum is unremarkable. No scapular fracture is noted. Stable deformity of the left fifth rib. Stable posterior deformity of the left seventh rib. No acute rib fractures are noted. CT ABDOMEN PELVIS FINDINGS Hepatobiliary: Study is limited without IV contrast. The unenhanced liver shows no biliary ductal dilatation. Status postcholecystectomy Pancreas: Again noted chronic pancreatic calcifications consistent with chronic calcific pancreatitis. No dilatation of main pancreatic duct. Spleen: Unenhanced spleen shows no evidence of laceration. Adrenals/Urinary Tract: No adrenal gland mass is noted. Unenhanced  kidneys are symmetrical in size. No nephrolithiasis. No hydronephrosis or hydroureter. The urinary bladder is unremarkable. Stomach/Bowel: There is no small bowel obstruction. No thickened or dilated small bowel loops. The terminal ileum is unremarkable. Moderate stool noted within cecum and right colon. Moderate stool and gas noted within transverse colon. There is no pericecal inflammation. The patient is status post appendectomy. Scattered diverticula are noted descending colon. No evidence of acute diverticulitis. Scattered diverticula are noted within proximal sigmoid colon. No evidence of acute colitis or diverticulitis. Moderate stool  noted within redundant sigmoid colon. Vascular/Lymphatic: No retroperitoneal or mesenteric adenopathy. Atherosclerotic calcifications of abdominal aorta and iliac arteries. No aortic aneurysm. Reproductive: The uterus and ovaries are normal for age. Atherosclerotic adnexal vascular calcifications are noted. Other: No ascites or free abdominal air. There is a small left inguinal region hernia containing fat measures 2 cm without evidence of acute complication. Musculoskeletal: Sagittal and coronal images of the thoracic and lumbar spine submitted. Again no evidence of acute fracture or subluxation. Stable postsurgical changes upper thoracic spine. Stable compression deformity T11 vertebral body. There is diffuse osteopenia. No pelvic fractures are noted. There are degenerative changes bilateral SI joints. Mild degenerative changes pubic symphysis. Coronal images shows no hip fracture. Bilateral hip symmetrical in appearance. Sagittal images of the lumbar spine shows about 3 mm anterolisthesis L4 on L5 vertebral body. There is disc space flattening with vacuum disc phenomenon at L4-L5 level. There is interval mild compression deformity lower endplate of T12 vertebral body. This is of indeterminate age. No evidence of bony protrusion in spinal canal at this level. She IMPRESSION:  1. There is no lung contusion or pneumothorax. 2. Stable postsurgical changes upper thoracic spine. Stable compression deformity T11 vertebral body. There is interval mild compression deformity lower endplate of T12 vertebral body of indeterminate age. Clinical correlation is necessary. The spinal canal is patent at this level. No evidence of bony protrusion. 3. No mediastinal hematoma or adenopathy. 4. No hydronephrosis or hydroureter. 5. No ascites or free abdominal air. 6. No evidence of urinary bladder injury. 7. Moderate stool noted in right colon and transverse colon. Colonic diverticula are noted descending colon and sigmoid colon. No evidence of acute diverticulitis. Moderate stool noted in redundant sigmoid colon. 8. No pelvic fractures are noted. 9. Degenerative changes lumbar spine at L4-L5 level. Mild anterolisthesis about 3 mm L4 on L5 vertebral body. Disc space flattening with vacuum disc phenomenon at L4-L5 level. Electronically Signed   By: Natasha Mead M.D.   On: 05/04/2016 14:57   Ct Chest Wo Contrast  05/04/2016  CLINICAL DATA:  Bilateral shoulder pain, low back pain, fell on Wednesday and last Friday EXAM: CT CHEST, ABDOMEN AND PELVIS WITHOUT CONTRAST TECHNIQUE: Multidetector CT imaging of the chest, abdomen and pelvis was performed following the standard protocol without IV contrast. COMPARISON:  None. FINDINGS: CT CHEST FINDINGS Mediastinum/Lymph Nodes: Images of the thoracic inlet are unremarkable. Central airways are patent. Atherosclerotic calcifications of thoracic aorta and coronary arteries. No aortic aneurysm. No mediastinal hematoma or adenopathy. Heart size within normal limits. No pericardial effusion. Lungs/Pleura: Images of the lung parenchyma shows no acute infiltrate or pulmonary edema. Stable scarring in right lower lobe posterior medially prior spinal. Stable bilateral lower lobe anterolateral peripheral scarring. There is no lung contusion or pneumothorax. No segmental  consolidation. Musculoskeletal: Stable old fracture deformity of the right third and fourth rib. Again noted postsurgical changes with T4 and T5 corpectomy and solid fusion. The alignment is preserved. Stable remote compression fracture of T11 vertebral body. Sagittal images of the spine shows no acute fracture is. Sagittal view of the sternum is unremarkable. No scapular fracture is noted. Stable deformity of the left fifth rib. Stable posterior deformity of the left seventh rib. No acute rib fractures are noted. CT ABDOMEN PELVIS FINDINGS Hepatobiliary: Study is limited without IV contrast. The unenhanced liver shows no biliary ductal dilatation. Status postcholecystectomy Pancreas: Again noted chronic pancreatic calcifications consistent with chronic calcific pancreatitis. No dilatation of main pancreatic duct. Spleen: Unenhanced spleen shows no  evidence of laceration. Adrenals/Urinary Tract: No adrenal gland mass is noted. Unenhanced kidneys are symmetrical in size. No nephrolithiasis. No hydronephrosis or hydroureter. The urinary bladder is unremarkable. Stomach/Bowel: There is no small bowel obstruction. No thickened or dilated small bowel loops. The terminal ileum is unremarkable. Moderate stool noted within cecum and right colon. Moderate stool and gas noted within transverse colon. There is no pericecal inflammation. The patient is status post appendectomy. Scattered diverticula are noted descending colon. No evidence of acute diverticulitis. Scattered diverticula are noted within proximal sigmoid colon. No evidence of acute colitis or diverticulitis. Moderate stool noted within redundant sigmoid colon. Vascular/Lymphatic: No retroperitoneal or mesenteric adenopathy. Atherosclerotic calcifications of abdominal aorta and iliac arteries. No aortic aneurysm. Reproductive: The uterus and ovaries are normal for age. Atherosclerotic adnexal vascular calcifications are noted. Other: No ascites or free abdominal  air. There is a small left inguinal region hernia containing fat measures 2 cm without evidence of acute complication. Musculoskeletal: Sagittal and coronal images of the thoracic and lumbar spine submitted. Again no evidence of acute fracture or subluxation. Stable postsurgical changes upper thoracic spine. Stable compression deformity T11 vertebral body. There is diffuse osteopenia. No pelvic fractures are noted. There are degenerative changes bilateral SI joints. Mild degenerative changes pubic symphysis. Coronal images shows no hip fracture. Bilateral hip symmetrical in appearance. Sagittal images of the lumbar spine shows about 3 mm anterolisthesis L4 on L5 vertebral body. There is disc space flattening with vacuum disc phenomenon at L4-L5 level. There is interval mild compression deformity lower endplate of T12 vertebral body. This is of indeterminate age. No evidence of bony protrusion in spinal canal at this level. She IMPRESSION: 1. There is no lung contusion or pneumothorax. 2. Stable postsurgical changes upper thoracic spine. Stable compression deformity T11 vertebral body. There is interval mild compression deformity lower endplate of T12 vertebral body of indeterminate age. Clinical correlation is necessary. The spinal canal is patent at this level. No evidence of bony protrusion. 3. No mediastinal hematoma or adenopathy. 4. No hydronephrosis or hydroureter. 5. No ascites or free abdominal air. 6. No evidence of urinary bladder injury. 7. Moderate stool noted in right colon and transverse colon. Colonic diverticula are noted descending colon and sigmoid colon. No evidence of acute diverticulitis. Moderate stool noted in redundant sigmoid colon. 8. No pelvic fractures are noted. 9. Degenerative changes lumbar spine at L4-L5 level. Mild anterolisthesis about 3 mm L4 on L5 vertebral body. Disc space flattening with vacuum disc phenomenon at L4-L5 level. Electronically Signed   By: Natasha Mead M.D.   On:  05/04/2016 14:57   Ct 3d Recon At Scanner  05/04/2016  CLINICAL DATA:  Bilateral shoulder pain, low back pain, fell on Wednesday and last Friday EXAM: CT CHEST, ABDOMEN AND PELVIS WITHOUT CONTRAST TECHNIQUE: Multidetector CT imaging of the chest, abdomen and pelvis was performed following the standard protocol without IV contrast. COMPARISON:  None. FINDINGS: CT CHEST FINDINGS Mediastinum/Lymph Nodes: Images of the thoracic inlet are unremarkable. Central airways are patent. Atherosclerotic calcifications of thoracic aorta and coronary arteries. No aortic aneurysm. No mediastinal hematoma or adenopathy. Heart size within normal limits. No pericardial effusion. Lungs/Pleura: Images of the lung parenchyma shows no acute infiltrate or pulmonary edema. Stable scarring in right lower lobe posterior medially prior spinal. Stable bilateral lower lobe anterolateral peripheral scarring. There is no lung contusion or pneumothorax. No segmental consolidation. Musculoskeletal: Stable old fracture deformity of the right third and fourth rib. Again noted postsurgical changes with T4 and  T5 corpectomy and solid fusion. The alignment is preserved. Stable remote compression fracture of T11 vertebral body. Sagittal images of the spine shows no acute fracture is. Sagittal view of the sternum is unremarkable. No scapular fracture is noted. Stable deformity of the left fifth rib. Stable posterior deformity of the left seventh rib. No acute rib fractures are noted. CT ABDOMEN PELVIS FINDINGS Hepatobiliary: Study is limited without IV contrast. The unenhanced liver shows no biliary ductal dilatation. Status postcholecystectomy Pancreas: Again noted chronic pancreatic calcifications consistent with chronic calcific pancreatitis. No dilatation of main pancreatic duct. Spleen: Unenhanced spleen shows no evidence of laceration. Adrenals/Urinary Tract: No adrenal gland mass is noted. Unenhanced kidneys are symmetrical in size. No  nephrolithiasis. No hydronephrosis or hydroureter. The urinary bladder is unremarkable. Stomach/Bowel: There is no small bowel obstruction. No thickened or dilated small bowel loops. The terminal ileum is unremarkable. Moderate stool noted within cecum and right colon. Moderate stool and gas noted within transverse colon. There is no pericecal inflammation. The patient is status post appendectomy. Scattered diverticula are noted descending colon. No evidence of acute diverticulitis. Scattered diverticula are noted within proximal sigmoid colon. No evidence of acute colitis or diverticulitis. Moderate stool noted within redundant sigmoid colon. Vascular/Lymphatic: No retroperitoneal or mesenteric adenopathy. Atherosclerotic calcifications of abdominal aorta and iliac arteries. No aortic aneurysm. Reproductive: The uterus and ovaries are normal for age. Atherosclerotic adnexal vascular calcifications are noted. Other: No ascites or free abdominal air. There is a small left inguinal region hernia containing fat measures 2 cm without evidence of acute complication. Musculoskeletal: Sagittal and coronal images of the thoracic and lumbar spine submitted. Again no evidence of acute fracture or subluxation. Stable postsurgical changes upper thoracic spine. Stable compression deformity T11 vertebral body. There is diffuse osteopenia. No pelvic fractures are noted. There are degenerative changes bilateral SI joints. Mild degenerative changes pubic symphysis. Coronal images shows no hip fracture. Bilateral hip symmetrical in appearance. Sagittal images of the lumbar spine shows about 3 mm anterolisthesis L4 on L5 vertebral body. There is disc space flattening with vacuum disc phenomenon at L4-L5 level. There is interval mild compression deformity lower endplate of T12 vertebral body. This is of indeterminate age. No evidence of bony protrusion in spinal canal at this level. She IMPRESSION: 1. There is no lung contusion or  pneumothorax. 2. Stable postsurgical changes upper thoracic spine. Stable compression deformity T11 vertebral body. There is interval mild compression deformity lower endplate of T12 vertebral body of indeterminate age. Clinical correlation is necessary. The spinal canal is patent at this level. No evidence of bony protrusion. 3. No mediastinal hematoma or adenopathy. 4. No hydronephrosis or hydroureter. 5. No ascites or free abdominal air. 6. No evidence of urinary bladder injury. 7. Moderate stool noted in right colon and transverse colon. Colonic diverticula are noted descending colon and sigmoid colon. No evidence of acute diverticulitis. Moderate stool noted in redundant sigmoid colon. 8. No pelvic fractures are noted. 9. Degenerative changes lumbar spine at L4-L5 level. Mild anterolisthesis about 3 mm L4 on L5 vertebral body. Disc space flattening with vacuum disc phenomenon at L4-L5 level. Electronically Signed   By: Natasha Mead M.D.   On: 05/04/2016 14:57   I have personally reviewed and evaluated these images and lab results as part of my medical decision-making.   EKG Interpretation None      MDM   Final diagnoses:  Pain   Patient is a 60 year old female with multiple medical problems including chronic pain, diabetes, seizures  and frequent falls presenting today after a fall yesterday and ongoing pain in her back, chest and abdomen.  Patient denies hitting her head or loss of consciousness. She does not take any anticoagulation. She has a history of chronic pain and takes pain medication prescribed per her pain management doctor. She had an appointment yesterday but was unable to find transportation and ran out of her pain medication 2 days ago. She has been taking Tylenol at home without improvement of her pain. On exam her abdomen is slightly distended with generalized pain as well as generalized pain over the thoracic spine where she has had extensive surgery. Excision saturation was  within normal limits. Vital signs within normal limits. Neurovascularly intact at this time. Patient does have 2+ pitting edema in the lower extremities which is unchanged from baseline.  CBC, CMP, UA pending. CT of the abdomen and chest pending for underlying injury.  Patient given pain control.  3:05 PM Imaging is negative for acute pathology. Patient does have moderate constipation which may be the cause of her abdominal distention.  Gwyneth Sprout, MD 05/04/16 2145

## 2016-05-19 ENCOUNTER — Emergency Department (HOSPITAL_BASED_OUTPATIENT_CLINIC_OR_DEPARTMENT_OTHER): Payer: Medicaid Other

## 2016-05-19 ENCOUNTER — Encounter (HOSPITAL_BASED_OUTPATIENT_CLINIC_OR_DEPARTMENT_OTHER): Payer: Self-pay

## 2016-05-19 ENCOUNTER — Emergency Department (HOSPITAL_BASED_OUTPATIENT_CLINIC_OR_DEPARTMENT_OTHER)
Admission: EM | Admit: 2016-05-19 | Discharge: 2016-05-20 | Disposition: A | Discharge status: Home / Self Care | Payer: Medicaid Other | Attending: Emergency Medicine | Admitting: Emergency Medicine

## 2016-05-19 DIAGNOSIS — J45909 Unspecified asthma, uncomplicated: Secondary | ICD-10-CM | POA: Diagnosis not present

## 2016-05-19 DIAGNOSIS — Y939 Activity, unspecified: Secondary | ICD-10-CM | POA: Diagnosis not present

## 2016-05-19 DIAGNOSIS — Y999 Unspecified external cause status: Secondary | ICD-10-CM | POA: Diagnosis not present

## 2016-05-19 DIAGNOSIS — I10 Essential (primary) hypertension: Secondary | ICD-10-CM | POA: Insufficient documentation

## 2016-05-19 DIAGNOSIS — M546 Pain in thoracic spine: Secondary | ICD-10-CM | POA: Diagnosis not present

## 2016-05-19 DIAGNOSIS — W1839XA Other fall on same level, initial encounter: Secondary | ICD-10-CM | POA: Diagnosis not present

## 2016-05-19 DIAGNOSIS — M1990 Unspecified osteoarthritis, unspecified site: Secondary | ICD-10-CM | POA: Diagnosis not present

## 2016-05-19 DIAGNOSIS — E119 Type 2 diabetes mellitus without complications: Secondary | ICD-10-CM | POA: Insufficient documentation

## 2016-05-19 DIAGNOSIS — Z79899 Other long term (current) drug therapy: Secondary | ICD-10-CM | POA: Diagnosis not present

## 2016-05-19 DIAGNOSIS — R0789 Other chest pain: Secondary | ICD-10-CM | POA: Diagnosis not present

## 2016-05-19 DIAGNOSIS — Z87891 Personal history of nicotine dependence: Secondary | ICD-10-CM | POA: Diagnosis not present

## 2016-05-19 DIAGNOSIS — M25519 Pain in unspecified shoulder: Secondary | ICD-10-CM

## 2016-05-19 DIAGNOSIS — Z7982 Long term (current) use of aspirin: Secondary | ICD-10-CM | POA: Diagnosis not present

## 2016-05-19 DIAGNOSIS — Y92481 Parking lot as the place of occurrence of the external cause: Secondary | ICD-10-CM | POA: Diagnosis not present

## 2016-05-19 DIAGNOSIS — S43141A Inferior dislocation of right acromioclavicular joint, initial encounter: Secondary | ICD-10-CM | POA: Diagnosis not present

## 2016-05-19 DIAGNOSIS — S43101A Unspecified dislocation of right acromioclavicular joint, initial encounter: Secondary | ICD-10-CM

## 2016-05-19 DIAGNOSIS — M25511 Pain in right shoulder: Secondary | ICD-10-CM | POA: Diagnosis present

## 2016-05-19 LAB — TROPONIN I: Troponin I: 0.03 ng/mL (ref <0.03)

## 2016-05-19 LAB — BASIC METABOLIC PANEL
Anion gap: 8 (ref 5–15)
BUN: 15 mg/dL (ref 6–20)
CALCIUM: 8.3 mg/dL — AB (ref 8.9–10.3)
CHLORIDE: 109 mmol/L (ref 101–111)
CO2: 21 mmol/L — AB (ref 22–32)
CREATININE: 0.94 mg/dL (ref 0.44–1.00)
GFR calc Af Amer: >60 mL/min (ref >60)
GFR calc non Af Amer: >60 mL/min (ref >60)
Glucose, Bld: 131 mg/dL — ABNORMAL HIGH (ref 65–99)
Potassium: 3 mmol/L — ABNORMAL LOW (ref 3.5–5.1)
SODIUM: 138 mmol/L (ref 135–145)

## 2016-05-19 LAB — URINALYSIS, ROUTINE W REFLEX MICROSCOPIC
BILIRUBIN URINE: NEGATIVE
GLUCOSE, UA: NEGATIVE mg/dL
HGB URINE DIPSTICK: NEGATIVE
Ketones, ur: NEGATIVE mg/dL
Leukocytes, UA: NEGATIVE
Nitrite: NEGATIVE
PROTEIN: 30 mg/dL — AB
Specific Gravity, Urine: 1.024 (ref 1.005–1.030)
pH: 6.5 (ref 5.0–8.0)

## 2016-05-19 LAB — URINE MICROSCOPIC-ADD ON: WBC, UA: NONE SEEN WBC/hpf (ref 0–5)

## 2016-05-19 LAB — CBC
HCT: 29.2 % — ABNORMAL LOW (ref 36.0–46.0)
Hemoglobin: 9.5 g/dL — ABNORMAL LOW (ref 12.0–15.0)
MCH: 24.7 pg — ABNORMAL LOW (ref 26.0–34.0)
MCHC: 32.5 g/dL (ref 30.0–36.0)
MCV: 76 fL — AB (ref 78.0–100.0)
Platelets: 268 10*3/uL (ref 150–400)
RBC: 3.84 MIL/uL — ABNORMAL LOW (ref 3.87–5.11)
RDW: 17.1 % — ABNORMAL HIGH (ref 11.5–15.5)
WBC: 8.8 10*3/uL (ref 4.0–10.5)

## 2016-05-19 MED ORDER — MORPHINE SULFATE (PF) 4 MG/ML IV SOLN
4.0000 mg | Freq: Once | INTRAVENOUS | Status: AC
  Administered 2016-05-19: 4 mg via INTRAVENOUS
  Filled 2016-05-19: qty 1

## 2016-05-19 NOTE — ED Notes (Signed)
Pt moved with a lot more ease once in an assessment room than she did when she was still in triage.

## 2016-05-19 NOTE — ED Notes (Signed)
Pt c/o chest pain for two weeks, then states she fell yesterday on the side walk and is having worsening chronic back and shoulder pain on the right side.

## 2016-05-19 NOTE — ED Provider Notes (Signed)
CSN: [id number]     Arrival date & time 05/19/16  2100 History   First MD Initiated Contact with Patient 05/19/16 2115     Chief Complaint  Patient presents with  . Pain   HPI  Amber [name] is a 60 year old female with past medical history of chronic pain, constipation, frequent falls, GERD and diabetes presenting with right shoulder pain, thoracic back pain and chest pain. Patient states that she has had frequent falls due to her brakes being broken on her walker. She states that when she attempts to move from a seated to standing position, her walker frequently moves away from her and she falls. Her most recent fall was 2 days ago when she fell in a parking lot onto concrete. No head injury or LOC. She reports falling onto her right shoulder which has been painful ever since. The shoulder pain is exacerbated by movement of the right upper extremity. Denies weakness or numbness in the right upper extremity. She states that this exacerbated her chronic back pain. Pt has history of chronic thoracic back pain with multiple surgeries. She also complains of chest pain. She states that the chest pain is related to her frequent falls and has been present since her last ED visit two weeks ago. Chest pain is aching and located over the left and central chest. The chest pain is worsened when she tries to the move from a supine to seated position and with palpation. She denies associated shortness of breath with the chest pain but she does note that she frequently has shortness of breath secondary to her asthma. She denies change in shortness of breath or coughing. She reports a long history of back pain requiring multiple surgeries. She takes oxycodone and Opana for her chronic pain. She states that the oxycodone does not work for her but the Opana resolves her pain for a short period of time. Denies fevers, chills, headache, vision changes, dizziness, syncope, URI symptoms, neck pain, palpitations, abdominal pain,  nausea, vomiting, extremity weakness, extremity numbness, bowel/bladder incontinence, saddle paresthesias or wounds. Pt notes chronic constipation related to chronic narcotic use for which she takes miralax. Pt also notes chronic lower extremity edema unchanged today. No blood thinners.   Past Medical History  Diagnosis Date  . Arthritis   . Asthma   . Hypertension   . Constipation   . GERD (gastroesophageal reflux disease)   . Diabetes mellitus without complication (HCC)   . Chronic pain   . Seizures (HCC)     last seizure March 2015  . Pneumonia 10/2015  . Falls frequently    Past Surgical History  Procedure Laterality Date  . Back surgery    . Cholecystectomy    . Appendectomy     Family History  Problem Relation Age of Onset  . Kidney failure Mother   . Hypertension Sister   . Hypertension Brother    Social History  Substance Use Topics  . Smoking status: Former Games developer  . Smokeless tobacco: Never Used  . Alcohol Use: No     Comment: admits to 2 drinks/week   OB History    No data available     Review of Systems  All other systems reviewed and are negative.     Allergies  Ivp dye  Home Medications   Prior to Admission medications   Medication Sig Start Date End Date Taking? Authorizing Provider  albuterol (PROVENTIL HFA;VENTOLIN HFA) 108 (90 BASE) MCG/ACT inhaler Inhale 2 puffs into the lungs every  6 (six) hours as needed for wheezing or shortness of breath.    Historical Provider, MD  amLODipine (NORVASC) 5 MG tablet Take 5 mg by mouth daily.    Historical Provider, MD  aspirin EC 81 MG tablet Take 1 tablet (81 mg total) by mouth daily. 06/14/15   Richarda Overlie, MD  atorvastatin (LIPITOR) 10 MG tablet Take 10 mg by mouth daily. 08/13/15   Historical Provider, MD  cholecalciferol (VITAMIN D) 1000 units tablet Take 2,000 Units by mouth daily.    Historical Provider, MD  cyclobenzaprine (FLEXERIL) 10 MG tablet Take 1 tablet (10 mg total) by mouth 2 (two) times  daily as needed for muscle spasms. Patient taking differently: Take 10 mg by mouth 2 (two) times daily.  01/31/16   Arthor Captain, PA-C  dextromethorphan-guaiFENesin (MUCINEX DM) 30-600 MG 12hr tablet Take 1 tablet by mouth 2 (two) times daily. Patient taking differently: Take 1 tablet by mouth 2 (two) times daily as needed.  01/02/16   Vanetta Mulders, MD  diazepam (VALIUM) 5 MG tablet Take 1 tablet (5 mg total) by mouth 2 (two) times daily. 05/04/16   Elson Areas, PA-C  feeding supplement, ENSURE ENLIVE, (ENSURE ENLIVE) LIQD Take 237 mLs by mouth 2 (two) times daily between meals. 11/10/15   Catarina Hartshorn, MD  Fluticasone-Salmeterol (ADVAIR) 250-50 MCG/DOSE AEPB Inhale 1 puff into the lungs 2 (two) times daily.    Historical Provider, MD  folic acid (FOLVITE) 1 MG tablet Take 1 mg by mouth daily.    Historical Provider, MD  furosemide (LASIX) 40 MG tablet Take 1 tablet (40 mg total) by mouth daily. 06/14/15   Richarda Overlie, MD  gabapentin (NEURONTIN) 300 MG capsule Take 1 capsule (300 mg total) by mouth 2 (two) times daily. 06/14/15   Richarda Overlie, MD  levofloxacin (LEVAQUIN) 500 MG tablet Take 1 tablet (500 mg total) by mouth daily. 03/01/16   Richarda Overlie, MD  LINZESS 145 MCG CAPS capsule Take 145 mcg by mouth daily as needed (constipation).  08/13/15   Historical Provider, MD  metFORMIN (GLUCOPHAGE-XR) 500 MG 24 hr tablet Take 500 mg by mouth daily with breakfast.    Historical Provider, MD  metoprolol succinate (TOPROL-XL) 25 MG 24 hr tablet Take 25 mg by mouth daily.    Historical Provider, MD  oxyCODONE 10 MG TABS Take 1 tablet (10 mg total) by mouth every 4 (four) hours as needed for severe pain. 03/01/16   Richarda Overlie, MD  oxymorphone 20 MG T12A Take 20 mg by mouth every 12 (twelve) hours. 03/01/16   Richarda Overlie, MD  pantoprazole (PROTONIX) 40 MG tablet Take 1 tablet (40 mg total) by mouth 2 (two) times daily before a meal. 12/25/15   Ripudeep K Rai, MD  polyethylene glycol (MIRALAX / GLYCOLAX)  packet Take 17 g by mouth 2 (two) times daily. 06/14/15   Richarda Overlie, MD  potassium chloride SA (K-DUR,KLOR-CON) 20 MEQ tablet Take 2 tablets (40 mEq total) by mouth daily. 01/27/15   Calvert Cantor, MD  predniSONE (DELTASONE) 10 MG tablet Take 15 mg by mouth daily with breakfast.    Historical Provider, MD  promethazine (PHENERGAN) 25 MG tablet Take 1 tablet by mouth every 6 (six) hours as needed for nausea or vomiting. nausea 10/06/15   Historical Provider, MD  tiZANidine (ZANAFLEX) 4 MG tablet Take 4 mg by mouth 4 (four) times daily. 08/13/15   Historical Provider, MD  Vitamin D, Ergocalciferol, (DRISDOL) 50000 UNITS CAPS capsule Take 1 capsule (50,000  Units total) by mouth every 7 (seven) days. Sunday Patient taking differently: Take 50,000 Units by mouth every 7 (seven) days.  01/27/15   Calvert Cantor, MD  VITAMIN E PO Take 1 capsule by mouth daily.    Historical Provider, MD   BP 141/96 mmHg  Pulse 114  Temp(Src) 98.5 F (36.9 C) (Oral)  Resp 26  Ht 5' (1.524 m)  Wt 64.411 kg  BMI 27.73 kg/m2  SpO2 98% Physical Exam  Constitutional: She is oriented to person, place, and time. She appears well-developed and well-nourished. No distress.  Appears older than stated age  HENT:  Head: Normocephalic and atraumatic.  Mouth/Throat: Oropharynx is clear and moist.  Eyes: Conjunctivae and EOM are normal. Pupils are equal, round, and reactive to light. Right eye exhibits no discharge. Left eye exhibits no discharge. No scleral icterus.  Neck: Normal range of motion. Neck supple.  No C spine TTP. FROM of neck intact.   Cardiovascular: Regular rhythm, normal heart sounds and intact distal pulses.   Mildly tachycardic  Pulmonary/Chest: Effort normal and breath sounds normal. No respiratory distress. She has no decreased breath sounds. She has no wheezes. She has no rales. She exhibits tenderness. She exhibits no crepitus, no deformity and no retraction.    Moderate TTP over the left anterior chest  wall. No bony deformities of the chest wall. No flail or crepitus. Lungs CTAB in all lung fields. No wheeze or crackle.   Abdominal: Soft. There is no tenderness.  Musculoskeletal: Normal range of motion.       Right shoulder: She exhibits tenderness. She exhibits normal range of motion, no swelling, no crepitus, no deformity, normal pulse and normal strength.       Thoracic back: She exhibits tenderness. She exhibits normal range of motion, no deformity and normal pulse.       Back:       Arms: TTP of the right shoulder with restricted active ROM secondary to pain. Full passive ROM intact. No palpable deformity of clavicle or humeral head. No tenderness of the humeral shaft, elbow or forearm.   TTP of the thoracic midline. No bony deformities or step offs. FROM of spine intact. Midline surgical scar present. No focal TTP of lumbar spine or paraspinal musculature.  FROM of lower extremities intact. Joints are supple without swelling or deformity.    Neurological: She is alert and oriented to person, place, and time. Coordination normal.  5/5 strength of the upper and lower extremities. Sensation to light touch intact throughout  Skin: Skin is warm and dry.  Psychiatric: She has a normal mood and affect. Her behavior is normal.  Nursing note and vitals reviewed.   ED Course  Procedures (including critical care time) Labs Review Labs Reviewed  BASIC METABOLIC PANEL - Abnormal; Notable for the following:    Potassium 3.0 (*)    CO2 21 (*)    Glucose, Bld 131 (*)    Calcium 8.3 (*)    All other components within normal limits  CBC - Abnormal; Notable for the following:    RBC 3.84 (*)    Hemoglobin 9.5 (*)    HCT 29.2 (*)    MCV 76.0 (*)    MCH 24.7 (*)    RDW 17.1 (*)    All other components within normal limits  TROPONIN I - Abnormal; Notable for the following:    Troponin I 0.03 (*)    All other components within normal limits  URINALYSIS, ROUTINE W REFLEX  MICROSCOPIC (NOT AT  [facility]) - Abnormal; Notable for the following:    APPearance CLOUDY (*)    Protein, ur 30 (*)    All other components within normal limits  URINE MICROSCOPIC-ADD ON - Abnormal; Notable for the following:    Squamous Epithelial / LPF 6-30 (*)    Bacteria, UA RARE (*)    All other components within normal limits    Imaging Review Dg Chest 2 View  05/19/2016  CLINICAL DATA:  60 year old female with chest pain for 2 weeks. Patient had a bony spurring with worsening back pain right shoulder pain. EXAM: CHEST  2 VIEW COMPARISON:  Chest radiograph dated 03/01/2016 and CT dated 05/04/2016 FINDINGS: Two views of the chest demonstrate patchy area airspace density at the left lung base which may represent atelectasis/scarring or developing pneumonia. There is blunting of the left costophrenic angle concerning for a small pleural effusion versus pleural scarring. The right lung is clear. Top-normal cardiac silhouette. There is osteopenia. Evaluation for fracture acute fracture is limited due to advanced osteopenia. There multilevel age indeterminate compression deformities of the thoracic spine. There has been interval progression of the compression fractures of the lower thoracic vertebrae. Posterior fusion metallic hardware as well as upper thoracic corpectomy. IMPRESSION: Left lung base atelectasis versus infiltrate. Pleural scarring versus trace left pleural effusion. Advanced osteopenia with multilevel age indeterminate compression deformity of the thoracic spine. There has been interval progression of the lower thoracic compression fractures compared to the prior study. Clinical correlation is recommended. First Electronically Signed   By: Elgie Collard M.D.   On: 05/19/2016 22:34   Dg Thoracic Spine 2 View  05/19/2016  CLINICAL DATA:  60 year old female with chest pain for 2 weeks. Patient had a bony spurring with worsening back pain right shoulder pain. EXAM: CHEST  2 VIEW COMPARISON:  Chest radiograph  dated 03/01/2016 and CT dated 05/04/2016 FINDINGS: Two views of the chest demonstrate patchy area airspace density at the left lung base which may represent atelectasis/scarring or developing pneumonia. There is blunting of the left costophrenic angle concerning for a small pleural effusion versus pleural scarring. The right lung is clear. Top-normal cardiac silhouette. There is osteopenia. Evaluation for fracture acute fracture is limited due to advanced osteopenia. There multilevel age indeterminate compression deformities of the thoracic spine. There has been interval progression of the compression fractures of the lower thoracic vertebrae. Posterior fusion metallic hardware as well as upper thoracic corpectomy. IMPRESSION: Left lung base atelectasis versus infiltrate. Pleural scarring versus trace left pleural effusion. Advanced osteopenia with multilevel age indeterminate compression deformity of the thoracic spine. There has been interval progression of the lower thoracic compression fractures compared to the prior study. Clinical correlation is recommended. First Electronically Signed   By: Elgie Collard M.D.   On: 05/19/2016 22:34   Dg Shoulder Right  05/19/2016  CLINICAL DATA:  Shoulder pain.  Fall yesterday EXAM: RIGHT SHOULDER - 2+ VIEW COMPARISON:  None. FINDINGS: Mild degenerative change in the glenohumeral joint. Rotator cuff impingement anatomy with narrowing of the acromial humeral distance. Inferior displacement of the acromion relative to the clavicle may represent grade 2 AC separation. This is not identified on the prior chest x-ray. No prior imaging of the right shoulder. This could be acute or chronic. Negative for fracture. IMPRESSION: Negative for fracture AC separation Electronically Signed   By: Marlan Palau M.D.   On: 05/19/2016 22:34   I have personally reviewed and evaluated these images and lab results as part  of my medical decision-making.   EKG  Interpretation   Date/Time:  Friday May 19 2016 21:22:27 EDT Ventricular Rate:  109 PR Interval:    QRS Duration: 47 QT Interval:  392 QTC Calculation: 528 R Axis:   33 Text Interpretation:  Sinus tachycardia Borderline T abnormalities,  anterior leads Prolonged QT interval Confirmed by [facility] MD, JULIE  ([id number]) on 05/19/2016 9:51:54 PM      MDM   Final diagnoses:  Shoulder pain  Right shoulder pain  Atypical chest pain  Midline thoracic back pain  AC separation, right, initial encounter   60 year old female with PMHx of chronic pain and frequent falls presenting with right shoulder pain, chest pain and acute worsening of chronic back pain after a fall two days ago. No head injury reported. Pt has been taking her chronic pain medications of oxycodone and opana with only partial relief of symptoms. Easily reproducible chest wall pain which seems unchanged from her ED visit two weeks ago. No bony deformities. Lungs CTAB. Right upper extremity is neurovascularly intact with tenderness at the right shoulder. No obvious deformity. Passive ROM intact. Tenderness over midline T spine spine without deformity. No lumbar pain. BLE neurovascularly intact. Hgb at baseline. Mild hypokalemia. No sign of infection on UA. Troponin 0.03. Chart review shows pt has chronically elevated troponin between 0.03 - 0.09 since 2016. ECG shows sinus tach without ischemia. DG chest shows left lung base atelectasis vs infiltrate. Pt has no infectious symptoms; unlikely to be PNA. T spine with advanced osteopenia and age indeterminate compression deformities with interval progression. Shoulder xray negative for fracture but does show possible AC separation. Pt given shoulder sling and advised to use her wheelchair at home until she follows up with her orthopedist regarding right shoulder injury. Discussed patient with attending Dr. Particia Nearing who reviewed pt case and old charts. Pt's chest pain is easily reproducible and  troponin appears at her baseline. Chest pain is consistent with MSK pain and unlikely to be ACS. Dr. Particia Nearing agrees with this and believes pt is appropriate for outpatient follow up with her PCP or cardiologist. Discussed pt's T spine compression fractures which Dr. Particia Nearing states can be followed up outpatient with her orthopedist. Discussed findings with pt and plan for close outpatient follow up. Pt is to continue her chronic pain medications. I have discussed at length and provided strict chest pain return precautions in discharge paperwork. Pt states understanding. Stable for discharge.     Rolm Gala Arjuna Doeden, PA-C 05/20/16 1252  Jacalyn Lefevre, MD 05/20/16 [phone]

## 2016-05-20 NOTE — Discharge Instructions (Signed)
Follow up with your orthopedic doctor or Dr. Pearletha Forge for your shoulder pain and worsening back pain. Follow up with your cardiologist as soon as possible. Return to ED with any changes in your chest pain, shortness of breath, sweating, nausea, exertional chest pain, worsening chest pain or any other new or concerning symptoms.   Acromioclavicular Separation With Rehab The acromioclavicular joint is the joint between the roof of the shoulder (acromion) and the collarbone (clavicle). It is vulnerable to injury. An acromioclavicular [facility]) separation is a partial or complete tear (sprain), injury, or redness and soreness (inflammation) of the ligaments that cross the acromioclavicular joint and hold it in place. There are two ligaments in this area that are vulnerable to injury, the acromioclavicular ligament and the coracoclavicular ligament. SYMPTOMS   Tenderness and swelling, or a bump on top of the shoulder (at the [facility] joint).  Bruising (contusion) in the area within 48 hours of injury.  Loss of strength or pain when reaching over the head or across the body. CAUSES  AC separation is caused by direct trauma to the joint (falling on your shoulder) or indirect trauma (falling on an outstretched arm). RISK INCREASES WITH:  Sports that require contact or collision, throwing sports (i.e. racquetball, squash).  Poor strength and flexibility.  Previous shoulder sprain or dislocation.  Poorly fitted or padded protective equipment. PREVENTION   Warm-up and stretch properly before activity.  Maintain physical fitness:  Shoulder strength.  Shoulder flexibility.  Cardiovascular fitness.  Wear properly fitted and padded protective equipment.  Learn and use proper technique when playing sports. Have a coach correct improper technique, including falling and landing.  Apply taping, protective strapping or padding, or an adhesive bandage as recommended before practice or competition. PROGNOSIS     If treated properly, the symptoms of AC separation can be expected to go away.  If treated improperly, permanent disability may occur unless surgery is performed.  Healing time varies with type of sport and position, arm injured (dominant versus non-dominant) and severity of sprain. RELATED COMPLICATIONS  Weakness and fatigue of the arm or shoulder are possible but uncommon.  Pain and inflammation of the [facility] joint may continue.  Prolonged healing time may be necessary if usual activities are resumed too early. This causes a susceptibility to recurrent injury.  Prolonged disability may occur.  The shoulder may remain unstable or arthritic following repeated injury. TREATMENT  Treatment initially involves ice and medication to help reduce pain and inflammation. It may also be necessary to modify your activities in order to prevent further injury. Both non-surgical and surgical interventions exist to treat AC separation. Non-surgical intervention is usually recommended and involves wearing a sling to immobilize the joint for a period of time to allow for healing. Surgical intervention is usually only considered for severe sprains of the ligament or for individuals who do not improve after 2 to 6 months of non-surgical treatment. Surgical interventions require 4 to 6 months before a return to sports is possible. MEDICATION  If pain medication is necessary, nonsteroidal anti-inflammatory medications, such as aspirin and ibuprofen, or other minor pain relievers, such as acetaminophen, are often recommended.  Do not take pain medication for 7 days before surgery.  Prescription pain relievers may be given by your caregiver. Use only as directed and only as much as you need.  Ointments applied to the skin may be helpful.  Corticosteroid injections may be given to reduce inflammation. HEAT AND COLD  Cold treatment (icing) relieves pain and reduces  inflammation. Cold treatment should be  applied for 10 to 15 minutes every 2 to 3 hours for inflammation and pain and immediately after any activity that aggravates your symptoms. Use ice packs or an ice massage.  Heat treatment may be used prior to performing the stretching and strengthening activities prescribed by your caregiver, physical therapist or athletic trainer. Use a heat pack or a warm soak. SEEK IMMEDIATE MEDICAL CARE IF:   Pain, swelling or bruising worsens despite treatment.  There is pain, numbness or coldness in the arm.  Discoloration appears in the fingernails.  New, unexplained symptoms develop. EXERCISES  RANGE OF MOTION (ROM) AND STRETCHING EXERCISES - Acromioclavicular Separation These exercises may help you when beginning to rehabilitate your injury. Your symptoms may resolve with or without further involvement from your physician, physical therapist or athletic trainer. While completing these exercises, remember:  Restoring tissue flexibility helps normal motion to return to the joints. This allows healthier, less painful movement and activity.  An effective stretch should be held for at least 30 seconds.  A stretch should never be painful. You should only feel a gentle lengthening or release in the stretched tissue. ROM - Pendulum  Bend at the waist so that your right / left arm falls away from your body. Support yourself with your opposite hand on a solid surface, such as a table or a countertop.  Your right / left arm should be perpendicular to the ground. If it is not perpendicular, you need to lean over farther. Relax the muscles in your right / left arm and shoulder as much as possible.  Gently sway your hips and trunk so they move your right / left arm without any use of your right / left shoulder muscles.  Progress your movements so that your right / left arm moves side to side, then forward and backward, and finally, both clockwise and counterclockwise.  Complete __________ repetitions in  each direction. Many people use this exercise to relieve discomfort in their shoulder as well as to gain range of motion. Repeat __________ times. Complete this exercise __________ times per day. STRETCH - Flexion, Seated   Sit in a firm chair so that your right / left forearm can rest on a table or countertop. Your right / left elbow should rest below the height of your shoulder so that your shoulder feels supported and not tense or uncomfortable.  Keeping your right / left shoulder relaxed, lean forward at your waist, allowing your right / left hand to slide forward. Bend forward until you feel a moderate stretch in your shoulder, but before you feel an increase in your pain.  Hold __________ seconds. Slowly return to your starting position. Repeat __________ times. Complete this exercise __________ times per day. STRETCH - Flexion, Standing  Stand with good posture. With an underhand grip on your right / left and an overhand grip on the opposite hand, grasp a broomstick or cane so that your hands are a little more than shoulder-width apart.  Keeping your right / left elbow straight and shoulder muscles relaxed, push the stick with your opposite hand to raise your right / left arm in front of your body and then overhead. Raise your arm until you feel a stretch in your right / left shoulder, but before you have increased shoulder pain.  Try to avoid shrugging your right / left shoulder as your arm rises by keeping your shoulder blade tucked down and toward your mid-back spine. Hold __________ seconds.  Slowly return to the starting position. Repeat __________ times. Complete this exercise __________ times per day. STRENGTHENING EXERCISES - Acromioclavicular Separation These exercises may help you when beginning to rehabilitate your injury. They may resolve your symptoms with or without further involvement from your physician, physical therapist or athletic trainer. While completing these  exercises, remember:  Muscles can gain both the endurance and the strength needed for everyday activities through controlled exercises.  Complete these exercises as instructed by your physician, physical therapist or athletic trainer. Progress the resistance and repetitions only as guided.  You may experience muscle soreness or fatigue, but the pain or discomfort you are trying to eliminate should never worsen during these exercises. If this pain does worsen, stop and make certain you are following the directions exactly. If the pain is still present after adjustments, discontinue the exercise until you can discuss the trouble with your clinician. STRENGTH - Shoulder Abductors, Isometric   With good posture, stand or sit about 4-6 inches from a wall with your right / left side facing the wall.  Bend your right / left elbow. Gently press your right / left elbow into the wall. Increase the pressure gradually until you are pressing as hard as you can without shrugging your shoulder or increasing any shoulder discomfort.  Hold __________ seconds.  Release the tension slowly. Relax your shoulder muscles completely before you start the next repetition. Repeat __________ times. Complete this exercise __________ times per day. STRENGTH - Internal Rotators, Isometric  Keep your right / left elbow at your side and bend it 90 degrees.  Step into a door frame so that the inside of your right / left wrist can press against the door frame without your upper arm leaving your side.  Gently press your right / left wrist into the door frame as if you were trying to draw the palm of your hand to your abdomen. Gradually increase the tension until you are pressing as hard as you can without shrugging your shoulder or increasing any shoulder discomfort.  Hold __________ seconds.  Release the tension slowly. Relax your shoulder muscles completely before you the next repetition. Repeat __________ times. Complete  this exercise __________ times per day.  STRENGTH - External Rotators, Isometric  Keep your right / left elbow at your side and bend it 90 degrees.  Step into a door frame so that the outside of your right / left wrist can press against the door frame without your upper arm leaving your side.  Gently press your right / left wrist into the door frame as if you were trying to swing the back of your hand away from your abdomen. Gradually increase the tension until you are pressing as hard as you can without shrugging your shoulder or increasing any shoulder discomfort.  Hold __________ seconds.  Release the tension slowly. Relax your shoulder muscles completely before you the next repetition. Repeat __________ times. Complete this exercise __________ times per day. STRENGTH - Internal Rotators  Secure a rubber exercise band/tubing to a fixed object so that it is at the same height as your right / left elbow when you are standing or sitting on a firm surface.  Stand or sit so that the secured exercise band/tubing is at your right / left side.  Bend your elbow 90 degrees. Place a folded towel or small pillow under your right / left arm so that your elbow is a few inches away from your side.  Keeping the tension on the  exercise band/tubing, pull it across your body toward your abdomen. Be sure to keep your body steady so that the movement is only coming from your shoulder rotating.  Hold __________ seconds. Release the tension in a controlled manner as you return to the starting position. Repeat __________ times. Complete this exercise __________ times per day. STRENGTH - External Rotators  Secure a rubber exercise band/tubing to a fixed object so that it is at the same height as your right / left elbow when you are standing or sitting on a firm surface.  Stand or sit so that the secured exercise band/tubing is at your side that is not injured.  Bend your elbow 90 degrees. Place a folded  towel or small pillow under your right / left arm so that your elbow is a few inches away from your side.  Keeping the tension on the exercise band/tubing, pull it away from your body, as if pivoting on your elbow. Be sure to keep your body steady so that the movement is only coming from your shoulder rotating.  Hold __________ seconds. Release the tension in a controlled manner as you return to the starting position. Repeat __________ times. Complete this exercise __________ times per day.   This information is not intended to replace advice given to you by your health care provider. Make sure you discuss any questions you have with your health care provider.   Document Released: 11/06/2005 Document Revised: 11/27/2014 Document Reviewed: 02/18/2009 Elsevier Interactive Patient Education 2016 [employer]  Back Pain, Adult Back pain is very common in adults.The cause of back pain is rarely dangerous and the pain often gets better over time.The cause of your back pain may not be known. Some common causes of back pain include:  Strain of the muscles or ligaments supporting the spine.  Wear and tear (degeneration) of the spinal disks.  Arthritis.  Direct injury to the back. For many people, back pain may return. Since back pain is rarely dangerous, most people can learn to manage this condition on their own. HOME CARE INSTRUCTIONS Watch your back pain for any changes. The following actions may help to lessen any discomfort you are feeling:  Remain active. It is stressful on your back to sit or stand in one place for long periods of time. Do not sit, drive, or stand in one place for more than 30 minutes at a time. Take short walks on even surfaces as soon as you are able.Try to increase the length of time you walk each day.  Exercise regularly as directed by your health care provider. Exercise helps your back heal faster. It also helps avoid future injury by keeping your muscles strong  and flexible.  Do not stay in bed.Resting more than 1-2 days can delay your recovery.  Pay attention to your body when you bend and lift. The most comfortable positions are those that put less stress on your recovering back. Always use proper lifting techniques, including:  Bending your knees.  Keeping the load close to your body.  Avoiding twisting.  Find a comfortable position to sleep. Use a firm mattress and lie on your side with your knees slightly bent. If you lie on your back, put a pillow under your knees.  Avoid feeling anxious or stressed.Stress increases muscle tension and can worsen back pain.It is important to recognize when you are anxious or stressed and learn ways to manage it, such as with exercise.  Take medicines only as directed by your health  care provider. Over-the-counter medicines to reduce pain and inflammation are often the most helpful.Your health care provider may prescribe muscle relaxant drugs.These medicines help dull your pain so you can more quickly return to your normal activities and healthy exercise.  Apply ice to the injured area:  Put ice in a plastic bag.  Place a towel between your skin and the bag.  Leave the ice on for 20 minutes, 2-3 times a day for the first 2-3 days. After that, ice and heat may be alternated to reduce pain and spasms.  Maintain a healthy weight. Excess weight puts extra stress on your back and makes it difficult to maintain good posture. SEEK MEDICAL CARE IF:  You have pain that is not relieved with rest or medicine.  You have increasing pain going down into the legs or buttocks.  You have pain that does not improve in one week.  You have night pain.  You lose weight.  You have a fever or chills. SEEK IMMEDIATE MEDICAL CARE IF:   You develop new bowel or bladder control problems.  You have unusual weakness or numbness in your arms or legs.  You develop nausea or vomiting.  You develop abdominal  pain.  You feel faint.   This information is not intended to replace advice given to you by your health care provider. Make sure you discuss any questions you have with your health care provider.   Document Released: 11/06/2005 Document Revised: 11/27/2014 Document Reviewed: 03/10/2014 Elsevier Interactive Patient Education 2016 [employer]  Chest Pain Observation It is often hard to give a specific diagnosis for the cause of chest pain. Among other possibilities your symptoms might be caused by inadequate oxygen delivery to your heart (angina). Angina that is not treated or evaluated can lead to a heart attack (myocardial infarction) or death. Blood tests, electrocardiograms, and X-rays may have been done to help determine a possible cause of your chest pain. After evaluation and observation, your health care provider has determined that it is unlikely your pain was caused by an unstable condition that requires hospitalization. However, a full evaluation of your pain may need to be completed, with additional diagnostic testing as directed. It is very important to keep your follow-up appointments. Not keeping your follow-up appointments could result in permanent heart damage, disability, or death. If there is any problem keeping your follow-up appointments, you must call your health care provider. HOME CARE INSTRUCTIONS  Due to the slight chance that your pain could be angina, it is important to follow your health care provider's treatment plan and also maintain a healthy lifestyle:  Maintain or work toward achieving a healthy weight.  Stay physically active and exercise regularly.  Decrease your salt intake.  Eat a balanced, healthy diet. Talk to a dietitian to learn about heart-healthy foods.  Increase your fiber intake by including whole grains, vegetables, fruits, and nuts in your diet.  Avoid situations that cause stress, anger, or depression.  Take medicines as advised by your  health care provider. Report any side effects to your health care provider. Do not stop medicines or adjust the dosages on your own.  Quit smoking. Do not use nicotine patches or gum until you check with your health care provider.  Keep your blood pressure, blood sugar, and cholesterol levels within normal limits.  Limit alcohol intake to no more than 1 drink per day for women who are not pregnant and 2 drinks per day for men.  Do not abuse drugs.  SEEK IMMEDIATE MEDICAL CARE IF: You have severe chest pain or pressure which may include symptoms such as:  You feel pain or pressure in your arms, neck, jaw, or back.  You have severe back or abdominal pain, feel sick to your stomach (nauseous), or throw up (vomit).  You are sweating profusely.  You are having a fast or irregular heartbeat.  You feel short of breath while at rest.  You notice increasing shortness of breath during rest, sleep, or with activity.  You have chest pain that does not get better after rest or after taking your usual medicine.  You wake from sleep with chest pain.  You are unable to sleep because you cannot breathe.  You develop a frequent cough or you are coughing up blood.  You feel dizzy, faint, or experience extreme fatigue.  You develop severe weakness, dizziness, fainting, or chills. Any of these symptoms may represent a serious problem that is an emergency. Do not wait to see if the symptoms will go away. Call your local emergency services (911 in the [place] Do not drive yourself to the hospital. MAKE SURE YOU:  Understand these instructions.  Will watch your condition.  Will get help right away if you are not doing well or get worse.   This information is not intended to replace advice given to you by your health care provider. Make sure you discuss any questions you have with your health care provider.   Document Released: 12/09/2010 Document Revised: 11/11/2013 Document Reviewed:  05/08/2013 Elsevier Interactive Patient Education [employer]

## 2016-05-24 ENCOUNTER — Encounter (HOSPITAL_COMMUNITY): Payer: Self-pay | Admitting: Emergency Medicine

## 2016-05-24 ENCOUNTER — Observation Stay (HOSPITAL_COMMUNITY)
Admission: EM | Admit: 2016-05-24 | Discharge: 2016-05-26 | Disposition: A | Discharge status: Home / Self Care | Payer: Medicaid Other | Attending: Family Medicine | Admitting: Family Medicine

## 2016-05-24 DIAGNOSIS — R778 Other specified abnormalities of plasma proteins: Secondary | ICD-10-CM | POA: Diagnosis not present

## 2016-05-24 DIAGNOSIS — Z87891 Personal history of nicotine dependence: Secondary | ICD-10-CM | POA: Insufficient documentation

## 2016-05-24 DIAGNOSIS — R000 Tachycardia, unspecified: Secondary | ICD-10-CM | POA: Diagnosis not present

## 2016-05-24 DIAGNOSIS — K5900 Constipation, unspecified: Secondary | ICD-10-CM | POA: Insufficient documentation

## 2016-05-24 DIAGNOSIS — R109 Unspecified abdominal pain: Secondary | ICD-10-CM

## 2016-05-24 DIAGNOSIS — R7989 Other specified abnormal findings of blood chemistry: Secondary | ICD-10-CM

## 2016-05-24 DIAGNOSIS — R1084 Generalized abdominal pain: Secondary | ICD-10-CM | POA: Diagnosis not present

## 2016-05-24 DIAGNOSIS — Z79899 Other long term (current) drug therapy: Secondary | ICD-10-CM | POA: Diagnosis not present

## 2016-05-24 DIAGNOSIS — R600 Localized edema: Secondary | ICD-10-CM | POA: Insufficient documentation

## 2016-05-24 DIAGNOSIS — Z7952 Long term (current) use of systemic steroids: Secondary | ICD-10-CM | POA: Insufficient documentation

## 2016-05-24 DIAGNOSIS — E876 Hypokalemia: Secondary | ICD-10-CM | POA: Insufficient documentation

## 2016-05-24 DIAGNOSIS — K219 Gastro-esophageal reflux disease without esophagitis: Secondary | ICD-10-CM | POA: Insufficient documentation

## 2016-05-24 DIAGNOSIS — G8929 Other chronic pain: Secondary | ICD-10-CM | POA: Insufficient documentation

## 2016-05-24 DIAGNOSIS — Z79891 Long term (current) use of opiate analgesic: Secondary | ICD-10-CM | POA: Diagnosis not present

## 2016-05-24 DIAGNOSIS — Z7982 Long term (current) use of aspirin: Secondary | ICD-10-CM | POA: Insufficient documentation

## 2016-05-24 DIAGNOSIS — I10 Essential (primary) hypertension: Secondary | ICD-10-CM | POA: Insufficient documentation

## 2016-05-24 DIAGNOSIS — E118 Type 2 diabetes mellitus with unspecified complications: Secondary | ICD-10-CM | POA: Insufficient documentation

## 2016-05-24 DIAGNOSIS — Z9181 History of falling: Secondary | ICD-10-CM | POA: Insufficient documentation

## 2016-05-24 DIAGNOSIS — Z7984 Long term (current) use of oral hypoglycemic drugs: Secondary | ICD-10-CM | POA: Insufficient documentation

## 2016-05-24 DIAGNOSIS — Z9049 Acquired absence of other specified parts of digestive tract: Secondary | ICD-10-CM | POA: Diagnosis not present

## 2016-05-24 DIAGNOSIS — D509 Iron deficiency anemia, unspecified: Secondary | ICD-10-CM

## 2016-05-24 DIAGNOSIS — J45909 Unspecified asthma, uncomplicated: Secondary | ICD-10-CM | POA: Insufficient documentation

## 2016-05-24 DIAGNOSIS — N19 Unspecified kidney failure: Secondary | ICD-10-CM | POA: Diagnosis not present

## 2016-05-24 DIAGNOSIS — I5189 Other ill-defined heart diseases: Secondary | ICD-10-CM | POA: Diagnosis not present

## 2016-05-24 LAB — CBG MONITORING, ED: Glucose-Capillary: 182 mg/dL — ABNORMAL HIGH (ref 65–99)

## 2016-05-24 LAB — I-STAT CG4 LACTIC ACID, ED: LACTIC ACID, VENOUS: 1.4 mmol/L (ref 0.5–1.9)

## 2016-05-24 MED ORDER — SODIUM CHLORIDE 0.9 % IV SOLN
1000.0000 mL | INTRAVENOUS | Status: DC
  Administered 2016-05-25: 1000 mL via INTRAVENOUS

## 2016-05-24 MED ORDER — HYDROMORPHONE HCL 1 MG/ML IJ SOLN
1.0000 mg | Freq: Once | INTRAMUSCULAR | Status: AC
  Administered 2016-05-25: 1 mg via INTRAVENOUS
  Filled 2016-05-24: qty 1

## 2016-05-24 MED ORDER — SODIUM CHLORIDE 0.9 % IV SOLN
1000.0000 mL | Freq: Once | INTRAVENOUS | Status: AC
  Administered 2016-05-25: 1000 mL via INTRAVENOUS

## 2016-05-24 MED ORDER — ONDANSETRON HCL 4 MG/2ML IJ SOLN
4.0000 mg | Freq: Once | INTRAMUSCULAR | Status: AC
  Administered 2016-05-25: 4 mg via INTRAVENOUS
  Filled 2016-05-24: qty 2

## 2016-05-24 NOTE — ED Notes (Signed)
Bed: KG25 Expected date:  Expected time:  Means of arrival:  Comments: EMS pancreatitis

## 2016-05-24 NOTE — ED Notes (Signed)
Pt comes to ed, via ems, c/o abdominal pain, generalized dull pain, no N/V/D . Reported recent constipation.ems on assessment verbalizes edema bilaterally both legs, generalize aching and pains. 20 in right hand.  V/s on arrival 136/88. Pulse 128, spo2 97 room air, cbg 222.  Diabetic type 2.  Weights 160.  Alert and orientated. Pt comes from home address listed. Ems was called out initially called for allergic reaction but none noted.

## 2016-05-24 NOTE — ED Provider Notes (Signed)
CSN: [id number]     Arrival date & time 05/24/16  2201 History  By signing my name below, I, Rosario Adie, attest that this documentation has been prepared under the direction and in the presence of Dione Booze, MD.  Electronically Signed: Rosario Adie, ED Scribe. 05/24/2016. 11:19 PM.  Chief Complaint  Patient presents with  . Abdominal Pain  . Allergic Reaction   The history is provided by the patient. No language interpreter was used.    HPI Comments: Patrici Minnis is a 60 y.o. female BIB EMS with a PMHx significant for HTN, GERD, and DM2 who presents to the Emergency Department complaining of gradual onset, gradually worsening, constant, 9/10 diffuse abdomninal pain onset 2 days ago. Pt reports that she has associated leg swelling, loss of appetite, diaphoresis, chills, constipation, and nausea. Pt reports that her pain radiates diffusely into her back and shoulders. Her pain is exacerbated when she sits up. No alleviating factors. She has a hx of similar pain. Pt has seen her PCP for her pain, but with no specific diagnosis. She reports that's they gave her stool softeners at that time. Her bowel movements have been decreased, but normal color and consistency. Her pain is unchanged with bowel movement. Pt denies vomiting, diarrhea, or fever.   Past Medical History  Diagnosis Date  . Arthritis   . Asthma   . Hypertension   . Constipation   . GERD (gastroesophageal reflux disease)   . Diabetes mellitus without complication (HCC)   . Chronic pain   . Seizures (HCC)     last seizure March 2015  . Pneumonia 10/2015  . Falls frequently    Past Surgical History  Procedure Laterality Date  . Back surgery    . Cholecystectomy    . Appendectomy     Family History  Problem Relation Age of Onset  . Kidney failure Mother   . Hypertension Sister   . Hypertension Brother    Social History  Substance Use Topics  . Smoking status: Former Games developer  . Smokeless tobacco:  Never Used  . Alcohol Use: No     Comment: admits to 2 drinks/week   OB History    No data available     Review of Systems  Constitutional: Positive for chills, diaphoresis and appetite change. Negative for fever.  Cardiovascular: Positive for leg swelling.  Gastrointestinal: Positive for nausea, abdominal pain and constipation. Negative for vomiting and diarrhea.   Allergies  Ivp dye  Home Medications   Prior to Admission medications   Medication Sig Start Date End Date Taking? Authorizing Provider  albuterol (PROVENTIL HFA;VENTOLIN HFA) 108 (90 BASE) MCG/ACT inhaler Inhale 2 puffs into the lungs every 6 (six) hours as needed for wheezing or shortness of breath.   Yes Historical Provider, MD  amLODipine (NORVASC) 5 MG tablet Take 5 mg by mouth daily.   Yes Historical Provider, MD  aspirin EC 81 MG tablet Take 1 tablet (81 mg total) by mouth daily. 06/14/15  Yes Richarda Overlie, MD  atorvastatin (LIPITOR) 10 MG tablet Take 10 mg by mouth daily. 08/13/15  Yes Historical Provider, MD  cyclobenzaprine (FLEXERIL) 10 MG tablet Take 1 tablet (10 mg total) by mouth 2 (two) times daily as needed for muscle spasms. Patient taking differently: Take 10 mg by mouth 2 (two) times daily.  01/31/16  Yes Arthor Captain, PA-C  dextromethorphan-guaiFENesin (MUCINEX DM) 30-600 MG 12hr tablet Take 1 tablet by mouth 2 (two) times daily. Patient taking differently: Take  1 tablet by mouth 2 (two) times daily as needed.  01/02/16  Yes Vanetta Mulders, MD  diazepam (VALIUM) 5 MG tablet Take 1 tablet (5 mg total) by mouth 2 (two) times daily. 05/04/16  Yes Elson Areas, PA-C  feeding supplement, ENSURE ENLIVE, (ENSURE ENLIVE) LIQD Take 237 mLs by mouth 2 (two) times daily between meals. 11/10/15  Yes Catarina Hartshorn, MD  Fluticasone-Salmeterol (ADVAIR) 250-50 MCG/DOSE AEPB Inhale 1 puff into the lungs 2 (two) times daily.   Yes Historical Provider, MD  folic acid (FOLVITE) 1 MG tablet Take 1 mg by mouth daily.   Yes  Historical Provider, MD  furosemide (LASIX) 40 MG tablet Take 1 tablet (40 mg total) by mouth daily. 06/14/15  Yes Richarda Overlie, MD  gabapentin (NEURONTIN) 300 MG capsule Take 1 capsule (300 mg total) by mouth 2 (two) times daily. 06/14/15  Yes Richarda Overlie, MD  LINZESS 145 MCG CAPS capsule Take 145 mcg by mouth daily as needed (constipation).  08/13/15  Yes Historical Provider, MD  metFORMIN (GLUCOPHAGE-XR) 500 MG 24 hr tablet Take 500 mg by mouth daily with breakfast.   Yes Historical Provider, MD  metoprolol succinate (TOPROL-XL) 25 MG 24 hr tablet Take 25 mg by mouth daily.   Yes Historical Provider, MD  OPANA ER, CRUSH RESISTANT, 40 MG T12A Take 1 tablet by mouth 2 (two) times daily. 05/19/16  Yes Historical Provider, MD  oxyCODONE 10 MG TABS Take 1 tablet (10 mg total) by mouth every 4 (four) hours as needed for severe pain. 03/01/16  Yes Richarda Overlie, MD  oxymorphone 20 MG T12A Take 20 mg by mouth every 12 (twelve) hours. 03/01/16  Yes Richarda Overlie, MD  pantoprazole (PROTONIX) 40 MG tablet Take 1 tablet (40 mg total) by mouth 2 (two) times daily before a meal. 12/25/15  Yes Ripudeep K Rai, MD  polyethylene glycol (MIRALAX / GLYCOLAX) packet Take 17 g by mouth 2 (two) times daily. 06/14/15  Yes Richarda Overlie, MD  potassium chloride SA (K-DUR,KLOR-CON) 20 MEQ tablet Take 2 tablets (40 mEq total) by mouth daily. 01/27/15  Yes Calvert Cantor, MD  predniSONE (DELTASONE) 10 MG tablet Take 15 mg by mouth daily with breakfast.   Yes Historical Provider, MD  promethazine (PHENERGAN) 25 MG tablet Take 1 tablet by mouth every 6 (six) hours as needed for nausea or vomiting. nausea 10/06/15  Yes Historical Provider, MD  tiZANidine (ZANAFLEX) 4 MG tablet Take 4 mg by mouth 4 (four) times daily. 08/13/15  Yes Historical Provider, MD  Vitamin D, Ergocalciferol, (DRISDOL) 50000 UNITS CAPS capsule Take 1 capsule (50,000 Units total) by mouth every 7 (seven) days. Sunday Patient taking differently: Take 50,000 Units by mouth  every 7 (seven) days.  01/27/15  Yes Calvert Cantor, MD  VITAMIN E PO Take 1 capsule by mouth daily.   Yes Historical Provider, MD  levofloxacin (LEVAQUIN) 500 MG tablet Take 1 tablet (500 mg total) by mouth daily. 03/01/16   Richarda Overlie, MD   BP 152/83 mmHg  Pulse 115  Temp(Src) 99.2 F (37.3 C) (Oral)  Resp 16  SpO2 91%   Physical Exam  Constitutional: She is oriented to person, place, and time. She appears well-developed and well-nourished. She appears distressed.  Pt is moaning in pain.   HENT:  Head: Normocephalic and atraumatic.  Eyes: Conjunctivae and EOM are normal. Pupils are equal, round, and reactive to light.  Neck: Normal range of motion. Neck supple. No JVD present.  Cardiovascular: Regular rhythm and normal  heart sounds.  Tachycardia present.   No murmur heard. Pulmonary/Chest: Effort normal and breath sounds normal. She has no wheezes. She has no rales. She exhibits no tenderness.  Abdominal: Soft. She exhibits distension. She exhibits no mass. There is tenderness. There is no rebound and no guarding.  Her abdomen is distended, soft, tender diffusely, and bowel sounds are decreased. No rebound or guarding.   Musculoskeletal: Normal range of motion.  Pt has 3+ pedal edema.  Lymphadenopathy:    She has no cervical adenopathy.  Neurological: She is alert and oriented to person, place, and time. No cranial nerve deficit. She exhibits normal muscle tone. Coordination normal.  Skin: Skin is warm and dry. No rash noted.  Nursing note and vitals reviewed.  ED Course  Procedures (including critical care time)  DIAGNOSTIC STUDIES: Oxygen Saturation is 96% on RA, normal by my interpretation.   COORDINATION OF CARE: 11:19 PM-Discussed next steps with pt including IV fluids, CBC, UA, Lipase, and CMP. Pt verbalized understanding and is agreeable with the plan.   Labs Review Results for orders placed or performed during the hospital encounter of 05/24/16  Lipase, blood   Result Value Ref Range   Lipase 23 11 - 51 U/L  Comprehensive metabolic panel  Result Value Ref Range   Sodium 139 135 - 145 mmol/L   Potassium 2.4 (LL) 3.5 - 5.1 mmol/L   Chloride 103 101 - 111 mmol/L   CO2 28 22 - 32 mmol/L   Glucose, Bld 128 (H) 65 - 99 mg/dL   BUN 15 6 - 20 mg/dL   Creatinine, Ser 1.61 (H) 0.44 - 1.00 mg/dL   Calcium 8.5 (L) 8.9 - 10.3 mg/dL   Total Protein 6.6 6.5 - 8.1 g/dL   Albumin 3.0 (L) 3.5 - 5.0 g/dL   AST 26 15 - 41 U/L   ALT 24 14 - 54 U/L   Alkaline Phosphatase 146 (H) 38 - 126 U/L   Total Bilirubin 1.0 0.3 - 1.2 mg/dL   GFR calc non Af Amer 57 (L) >60 mL/min   GFR calc Af Amer >60 >60 mL/min   Anion gap 8 5 - 15  Urinalysis, Routine w reflex microscopic  Result Value Ref Range   Color, Urine YELLOW YELLOW   APPearance CLEAR CLEAR   Specific Gravity, Urine 1.013 1.005 - 1.030   pH 6.5 5.0 - 8.0   Glucose, UA NEGATIVE NEGATIVE mg/dL   Hgb urine dipstick NEGATIVE NEGATIVE   Bilirubin Urine NEGATIVE NEGATIVE   Ketones, ur NEGATIVE NEGATIVE mg/dL   Protein, ur NEGATIVE NEGATIVE mg/dL   Nitrite NEGATIVE NEGATIVE   Leukocytes, UA NEGATIVE NEGATIVE  CBC with Differential  Result Value Ref Range   WBC 11.0 (H) 4.0 - 10.5 K/uL   RBC 4.26 3.87 - 5.11 MIL/uL   Hemoglobin 10.3 (L) 12.0 - 15.0 g/dL   HCT 09.6 (L) 04.5 - 40.9 %   MCV 75.1 (L) 78.0 - 100.0 fL   MCH 24.2 (L) 26.0 - 34.0 pg   MCHC 32.2 30.0 - 36.0 g/dL   RDW 81.1 (H) 91.4 - 78.2 %   Platelets 330 150 - 400 K/uL   Neutrophils Relative % 53 %   Neutro Abs 5.9 1.7 - 7.7 K/uL   Lymphocytes Relative 34 %   Lymphs Abs 3.7 0.7 - 4.0 K/uL   Monocytes Relative 12 %   Monocytes Absolute 1.3 (H) 0.1 - 1.0 K/uL   Eosinophils Relative 1 %   Eosinophils Absolute 0.1 0.0 -  0.7 K/uL   Basophils Relative 0 %   Basophils Absolute 0.0 0.0 - 0.1 K/uL  Troponin I  Result Value Ref Range   Troponin I 0.04 (HH) <0.03 ng/mL  Brain natriuretic peptide  Result Value Ref Range   B Natriuretic Peptide  50.6 0.0 - 100.0 pg/mL  Troponin I  Result Value Ref Range   Troponin I 0.04 (HH) <0.03 ng/mL  Potassium  Result Value Ref Range   Potassium 2.9 (L) 3.5 - 5.1 mmol/L  POC CBG, ED  Result Value Ref Range   Glucose-Capillary 182 (H) 65 - 99 mg/dL  I-Stat CG4 Lactic Acid, ED  Result Value Ref Range   Lactic Acid, Venous 1.40 0.5 - 1.9 mmol/L    Imaging Review Ct Abdomen Pelvis Wo Contrast  05/25/2016  CLINICAL DATA:  Acute onset of generalized abdominal pain, chills, constipation, nausea and diaphoresis. Initial encounter. EXAM: CT ABDOMEN AND PELVIS WITHOUT CONTRAST TECHNIQUE: Multidetector CT imaging of the abdomen and pelvis was performed following the standard protocol without IV contrast. COMPARISON:  CT of the abdomen and pelvis from 05/04/2016 FINDINGS: Mild bibasilar atelectasis or scarring is noted. The liver and spleen are unremarkable in appearance. The patient is status post cholecystectomy, with clips noted at the gallbladder fossa. The pancreas and adrenal glands are unremarkable. Scattered calcifications are seen within the pancreas, likely reflecting sequelae of chronic pancreatitis. The adrenal glands are unremarkable in appearance. The kidneys are unremarkable in appearance. There is no evidence of hydronephrosis. No renal or ureteral stones are seen. Minimal nonspecific perinephric stranding is noted bilaterally. No free fluid is identified. The small bowel is unremarkable in appearance. The stomach is within normal limits. No acute vascular abnormalities are seen. Scattered calcification is seen along the abdominal aorta and its branches. The patient is status post appendectomy. Minimal diverticulosis is noted along the proximal sigmoid colon, without evidence of diverticulitis. The bladder is mildly distended and grossly unremarkable. The uterus is unremarkable in appearance. No suspicious adnexal masses are seen. No inguinal lymphadenopathy is seen. No acute osseous abnormalities  are identified. Chronic loss of height is noted at vertebral bodies T12 and L1. There is mild grade 1 anterolisthesis of L4 on L5, with associated vacuum phenomenon and underlying facet disease. IMPRESSION: 1. No acute abnormality seen within the abdomen or pelvis. 2. Minimal diverticulosis along the proximal sigmoid colon, without evidence of diverticulitis. 3. Scattered calcifications within the pancreas likely reflects sequelae of chronic pancreatitis. 4. Mild bibasilar atelectasis or scarring noted. 5. Chronic loss of height at vertebral bodies T12 and L1. Electronically Signed   By: Roanna Raider M.D.   On: 05/25/2016 01:57   I have personally reviewed and evaluated these images and lab results as part of my medical decision-making.  MDM   Final diagnoses:  Intractable abdominal pain  Hypokalemia  Sinus tachycardia (HCC)    Abdominal pain of uncertain cause. Consider diverticulitis, pancreatitis, ischemic bowel. Old records reviewed in she has not been in the ED for abdominal complaints but did have a CT of abdomen and pelvis in June 2015 which was unremarkable. That CT was done because of a fall. She is given IV hydration and hydromorphone for pain with little relief. She's given a second dose of hydromorphone. Screening labs at come back showing severe hypokalemia and she's given intravenous potassium. There is borderline elevation troponin which is in the same range that had been elevated previously. Repeat troponin showed no change. She was sent for CT of abdomen and pelvis  which showed no acute changes. Patient continued to have poor pain control and required additional hydromorphone. She was persistently tachycardic. Lactic acid level is normal. Because of ongoing pain and tachycardia, decision was made to admit. Case is discussed with Dr. Toniann Fail of [facility] who agrees to admit the patient under observation status.  I personally performed the services described in this  documentation, which was scribed in my presence. The recorded information has been reviewed and is accurate.     Dione Booze, MD 05/25/16 [phone]

## 2016-05-25 ENCOUNTER — Encounter (HOSPITAL_COMMUNITY): Payer: Self-pay | Admitting: Internal Medicine

## 2016-05-25 ENCOUNTER — Emergency Department (HOSPITAL_COMMUNITY): Payer: Medicaid Other

## 2016-05-25 ENCOUNTER — Observation Stay (HOSPITAL_BASED_OUTPATIENT_CLINIC_OR_DEPARTMENT_OTHER): Payer: Medicaid Other

## 2016-05-25 ENCOUNTER — Observation Stay (HOSPITAL_COMMUNITY): Payer: Medicaid Other

## 2016-05-25 DIAGNOSIS — E876 Hypokalemia: Secondary | ICD-10-CM | POA: Diagnosis not present

## 2016-05-25 DIAGNOSIS — R7989 Other specified abnormal findings of blood chemistry: Secondary | ICD-10-CM | POA: Diagnosis not present

## 2016-05-25 DIAGNOSIS — R109 Unspecified abdominal pain: Secondary | ICD-10-CM | POA: Diagnosis not present

## 2016-05-25 LAB — ECHOCARDIOGRAM COMPLETE
AVLVOTPG: 5 mmHg
CHL CUP MV DEC (S): 174
E decel time: 174 msec
E/e' ratio: 9.48
FS: 32 % (ref 28–44)
HEIGHTINCHES: 60 in
IVS/LV PW RATIO, ED: 0.62
LA ID, A-P, ES: 39 mm
LA diam index: 2.32 cm/m2
LA vol index: 24.6 mL/m2
LAVOL: 41.3 mL
LAVOLA4C: 37.4 mL
LDCA: 2.54 cm2
LEFT ATRIUM END SYS DIAM: 39 mm
LV E/e' medial: 9.48
LV E/e'average: 9.48
LV PW d: 16.4 mm — AB (ref 0.6–1.1)
LVELAT: 7.83 cm/s
LVOT VTI: 25.7 cm
LVOT peak vel: 115 cm/s
LVOTD: 18 mm
LVOTSV: 65 mL
MV Peak grad: 2 mmHg
MV pk E vel: 74.2 m/s
MVPKAVEL: 82.9 m/s
Reg peak vel: 213 cm/s
TAPSE: 18.9 mm
TDI e' lateral: 7.83
TDI e' medial: 7.62
TRMAXVEL: 213 cm/s
WEIGHTICAEL: 2292.78 [oz_av]

## 2016-05-25 LAB — BRAIN NATRIURETIC PEPTIDE: B Natriuretic Peptide: 50.6 pg/mL (ref 0.0–100.0)

## 2016-05-25 LAB — CBC WITH DIFFERENTIAL/PLATELET
BASOS ABS: 0 10*3/uL (ref 0.0–0.1)
Basophils Absolute: 0 10*3/uL (ref 0.0–0.1)
Basophils Relative: 0 %
Basophils Relative: 0 %
EOS ABS: 0 10*3/uL (ref 0.0–0.7)
EOS PCT: 0 %
Eosinophils Absolute: 0.1 10*3/uL (ref 0.0–0.7)
Eosinophils Relative: 1 %
HCT: 30.4 % — ABNORMAL LOW (ref 36.0–46.0)
HEMATOCRIT: 32 % — AB (ref 36.0–46.0)
Hemoglobin: 10.3 g/dL — ABNORMAL LOW (ref 12.0–15.0)
Hemoglobin: 9.6 g/dL — ABNORMAL LOW (ref 12.0–15.0)
LYMPHS ABS: 0.9 10*3/uL (ref 0.7–4.0)
LYMPHS ABS: 3.7 10*3/uL (ref 0.7–4.0)
LYMPHS PCT: 12 %
LYMPHS PCT: 34 %
MCH: 24.4 pg — AB (ref 26.0–34.0)
MCH: 24.2 pg — AB (ref 26.0–34.0)
MCHC: 31.6 g/dL (ref 30.0–36.0)
MCHC: 32.2 g/dL (ref 30.0–36.0)
MCV: 77.2 fL — AB (ref 78.0–100.0)
MCV: 75.1 fL — AB (ref 78.0–100.0)
MONO ABS: 0.1 10*3/uL (ref 0.1–1.0)
MONO ABS: 1.3 10*3/uL — AB (ref 0.1–1.0)
Monocytes Relative: 12 %
Monocytes Relative: 2 %
NEUTROS ABS: 5.9 10*3/uL (ref 1.7–7.7)
Neutro Abs: 6.5 10*3/uL (ref 1.7–7.7)
Neutrophils Relative %: 53 %
Neutrophils Relative %: 86 %
PLATELETS: 305 10*3/uL (ref 150–400)
Platelets: 330 10*3/uL (ref 150–400)
RBC: 3.94 MIL/uL (ref 3.87–5.11)
RBC: 4.26 MIL/uL (ref 3.87–5.11)
RDW: 16.9 % — AB (ref 11.5–15.5)
RDW: 17 % — ABNORMAL HIGH (ref 11.5–15.5)
WBC: 7.5 10*3/uL (ref 4.0–10.5)
WBC: 11 10*3/uL — AB (ref 4.0–10.5)

## 2016-05-25 LAB — LIPID PANEL
Cholesterol: 86 mg/dL (ref 0–200)
HDL: 36 mg/dL — ABNORMAL LOW (ref >40)
LDL CALC: 31 mg/dL (ref 0–99)
Total CHOL/HDL Ratio: 2.4 RATIO
Triglycerides: 93 mg/dL (ref <150)
VLDL: 19 mg/dL (ref 0–40)

## 2016-05-25 LAB — TROPONIN I
TROPONIN I: 0.04 ng/mL — AB (ref <0.03)
TROPONIN I: 0.04 ng/mL — AB (ref <0.03)
TROPONIN I: 0.03 ng/mL — AB (ref <0.03)
Troponin I: 0.04 ng/mL (ref <0.03)
Troponin I: 0.04 ng/mL (ref <0.03)

## 2016-05-25 LAB — COMPREHENSIVE METABOLIC PANEL
ALBUMIN: 2.7 g/dL — AB (ref 3.5–5.0)
ALBUMIN: 3 g/dL — AB (ref 3.5–5.0)
ALT: 21 U/L (ref 14–54)
ALT: 24 U/L (ref 14–54)
ANION GAP: 8 (ref 5–15)
AST: 25 U/L (ref 15–41)
AST: 26 U/L (ref 15–41)
Alkaline Phosphatase: 123 U/L (ref 38–126)
Alkaline Phosphatase: 146 U/L — ABNORMAL HIGH (ref 38–126)
Anion gap: 6 (ref 5–15)
BILIRUBIN TOTAL: 1.2 mg/dL (ref 0.3–1.2)
BILIRUBIN TOTAL: 1 mg/dL (ref 0.3–1.2)
BUN: 10 mg/dL (ref 6–20)
BUN: 15 mg/dL (ref 6–20)
CHLORIDE: 108 mmol/L (ref 101–111)
CHLORIDE: 103 mmol/L (ref 101–111)
CO2: 26 mmol/L (ref 22–32)
CO2: 28 mmol/L (ref 22–32)
CREATININE: 0.78 mg/dL (ref 0.44–1.00)
Calcium: 7.8 mg/dL — ABNORMAL LOW (ref 8.9–10.3)
Calcium: 8.5 mg/dL — ABNORMAL LOW (ref 8.9–10.3)
Creatinine, Ser: 1.06 mg/dL — ABNORMAL HIGH (ref 0.44–1.00)
GFR calc Af Amer: >60 mL/min (ref >60)
GFR calc Af Amer: >60 mL/min (ref >60)
GFR calc non Af Amer: 57 mL/min — ABNORMAL LOW (ref >60)
GLUCOSE: 126 mg/dL — AB (ref 65–99)
GLUCOSE: 128 mg/dL — AB (ref 65–99)
POTASSIUM: 3.2 mmol/L — AB (ref 3.5–5.1)
POTASSIUM: 2.4 mmol/L — AB (ref 3.5–5.1)
SODIUM: 139 mmol/L (ref 135–145)
Sodium: 140 mmol/L (ref 135–145)
TOTAL PROTEIN: 6.6 g/dL (ref 6.5–8.1)
Total Protein: 6.2 g/dL — ABNORMAL LOW (ref 6.5–8.1)

## 2016-05-25 LAB — CBC
HEMATOCRIT: 29 % — AB (ref 36.0–46.0)
HEMOGLOBIN: 9.2 g/dL — AB (ref 12.0–15.0)
MCH: 24 pg — ABNORMAL LOW (ref 26.0–34.0)
MCHC: 31.7 g/dL (ref 30.0–36.0)
MCV: 75.5 fL — AB (ref 78.0–100.0)
Platelets: 293 10*3/uL (ref 150–400)
RBC: 3.84 MIL/uL — ABNORMAL LOW (ref 3.87–5.11)
RDW: 16.9 % — AB (ref 11.5–15.5)
WBC: 7.6 10*3/uL (ref 4.0–10.5)

## 2016-05-25 LAB — CREATININE, SERUM: Creatinine, Ser: 0.86 mg/dL (ref 0.44–1.00)

## 2016-05-25 LAB — URINALYSIS, ROUTINE W REFLEX MICROSCOPIC
Bilirubin Urine: NEGATIVE
GLUCOSE, UA: NEGATIVE mg/dL
Hgb urine dipstick: NEGATIVE
Ketones, ur: NEGATIVE mg/dL
LEUKOCYTES UA: NEGATIVE
Nitrite: NEGATIVE
PROTEIN: NEGATIVE mg/dL
SPECIFIC GRAVITY, URINE: 1.013 (ref 1.005–1.030)
pH: 6.5 (ref 5.0–8.0)

## 2016-05-25 LAB — RAPID URINE DRUG SCREEN, HOSP PERFORMED
Amphetamines: NOT DETECTED
BENZODIAZEPINES: POSITIVE — AB
Barbiturates: NOT DETECTED
COCAINE: NOT DETECTED
OPIATES: POSITIVE — AB
Tetrahydrocannabinol: NOT DETECTED

## 2016-05-25 LAB — GLUCOSE, CAPILLARY
GLUCOSE-CAPILLARY: 100 mg/dL — AB (ref 65–99)
GLUCOSE-CAPILLARY: 105 mg/dL — AB (ref 65–99)
GLUCOSE-CAPILLARY: 307 mg/dL — AB (ref 65–99)
GLUCOSE-CAPILLARY: 244 mg/dL — AB (ref 65–99)

## 2016-05-25 LAB — POTASSIUM: POTASSIUM: 2.9 mmol/L — AB (ref 3.5–5.1)

## 2016-05-25 LAB — MAGNESIUM: MAGNESIUM: 1.1 mg/dL — AB (ref 1.7–2.4)

## 2016-05-25 LAB — LIPASE, BLOOD: LIPASE: 23 U/L (ref 11–51)

## 2016-05-25 MED ORDER — ONDANSETRON HCL 4 MG/2ML IJ SOLN
4.0000 mg | Freq: Four times a day (QID) | INTRAMUSCULAR | Status: DC | PRN
Start: 2016-05-25 — End: 2016-05-26

## 2016-05-25 MED ORDER — ALBUTEROL SULFATE (2.5 MG/3ML) 0.083% IN NEBU: 2.5000 mg | INHALATION_SOLUTION | Freq: Four times a day (QID) | RESPIRATORY_TRACT | Status: DC | PRN

## 2016-05-25 MED ORDER — AMLODIPINE BESYLATE 5 MG PO TABS
5.0000 mg | ORAL_TABLET | Freq: Every day | ORAL | Status: DC
  Administered 2016-05-25 – 2016-05-26 (×2): 5 mg via ORAL
  Filled 2016-05-25 (×2): qty 1

## 2016-05-25 MED ORDER — FOLIC ACID 1 MG PO TABS
1.0000 mg | ORAL_TABLET | Freq: Every day | ORAL | Status: DC
  Administered 2016-05-25 – 2016-05-26 (×2): 1 mg via ORAL
  Filled 2016-05-25 (×2): qty 1

## 2016-05-25 MED ORDER — MORPHINE SULFATE (PF) 2 MG/ML IV SOLN
2.0000 mg | INTRAVENOUS | Status: DC | PRN
  Administered 2016-05-25 (×2): 2 mg via INTRAVENOUS
  Filled 2016-05-25 (×2): qty 1

## 2016-05-25 MED ORDER — ACETAMINOPHEN 325 MG PO TABS: 650.0000 mg | ORAL_TABLET | Freq: Four times a day (QID) | ORAL | Status: DC | PRN

## 2016-05-25 MED ORDER — ONDANSETRON HCL 4 MG PO TABS: 4.0000 mg | ORAL_TABLET | Freq: Four times a day (QID) | ORAL | Status: DC | PRN

## 2016-05-25 MED ORDER — METOPROLOL SUCCINATE ER 25 MG PO TB24
25.0000 mg | ORAL_TABLET | Freq: Every day | ORAL | Status: DC
  Administered 2016-05-25 – 2016-05-26 (×2): 25 mg via ORAL
  Filled 2016-05-25 (×2): qty 1

## 2016-05-25 MED ORDER — POTASSIUM CHLORIDE 10 MEQ/100ML IV SOLN
10.0000 meq | Freq: Once | INTRAVENOUS | Status: AC
  Administered 2016-05-25: 10 meq via INTRAVENOUS
  Filled 2016-05-25: qty 100

## 2016-05-25 MED ORDER — HYDROMORPHONE HCL 1 MG/ML IJ SOLN
1.0000 mg | Freq: Once | INTRAMUSCULAR | Status: AC
  Administered 2016-05-25: 1 mg via INTRAVENOUS
  Filled 2016-05-25: qty 1

## 2016-05-25 MED ORDER — METHYLPREDNISOLONE SODIUM SUCC 40 MG IJ SOLR
40.0000 mg | Freq: Every day | INTRAMUSCULAR | Status: DC
  Administered 2016-05-25 – 2016-05-26 (×2): 40 mg via INTRAVENOUS
  Filled 2016-05-25 (×2): qty 1

## 2016-05-25 MED ORDER — ALBUTEROL SULFATE HFA 108 (90 BASE) MCG/ACT IN AERS: 2.0000 | INHALATION_SPRAY | Freq: Four times a day (QID) | RESPIRATORY_TRACT | Status: DC | PRN

## 2016-05-25 MED ORDER — INSULIN ASPART 100 UNIT/ML ~~LOC~~ SOLN
0.0000 [IU] | SUBCUTANEOUS | Status: DC
  Administered 2016-05-25: 7 [IU] via SUBCUTANEOUS
  Administered 2016-05-25: 3 [IU] via SUBCUTANEOUS
  Administered 2016-05-26: 2 [IU] via SUBCUTANEOUS

## 2016-05-25 MED ORDER — ASPIRIN 300 MG RE SUPP
300.0000 mg | Freq: Every day | RECTAL | Status: DC
Start: 2016-05-25 — End: 2016-05-26
  Filled 2016-05-25 (×2): qty 1

## 2016-05-25 MED ORDER — OXYMORPHONE HCL ER 10 MG PO T12A: 20.0000 mg | EXTENDED_RELEASE_TABLET | Freq: Two times a day (BID) | ORAL | Status: DC

## 2016-05-25 MED ORDER — OXYMORPHONE HCL ER 20 MG PO TB12
20.0000 mg | ORAL_TABLET | Freq: Two times a day (BID) | ORAL | Status: DC
  Administered 2016-05-25 – 2016-05-26 (×3): 20 mg via ORAL
  Filled 2016-05-25 (×3): qty 1

## 2016-05-25 MED ORDER — ATORVASTATIN CALCIUM 10 MG PO TABS
10.0000 mg | ORAL_TABLET | Freq: Every day | ORAL | Status: DC
  Administered 2016-05-25: 10 mg via ORAL
  Filled 2016-05-25: qty 1

## 2016-05-25 MED ORDER — MORPHINE SULFATE (PF) 4 MG/ML IV SOLN
3.0000 mg | INTRAVENOUS | Status: DC | PRN
  Administered 2016-05-25 – 2016-05-26 (×2): 3 mg via INTRAVENOUS
  Filled 2016-05-25 (×2): qty 1

## 2016-05-25 MED ORDER — MOMETASONE FURO-FORMOTEROL FUM 200-5 MCG/ACT IN AERO
2.0000 | INHALATION_SPRAY | Freq: Two times a day (BID) | RESPIRATORY_TRACT | Status: DC
  Administered 2016-05-25 – 2016-05-26 (×3): 2 via RESPIRATORY_TRACT
  Filled 2016-05-25: qty 8.8

## 2016-05-25 MED ORDER — DIAZEPAM 5 MG PO TABS
5.0000 mg | ORAL_TABLET | Freq: Two times a day (BID) | ORAL | Status: DC
  Administered 2016-05-25 – 2016-05-26 (×3): 5 mg via ORAL
  Filled 2016-05-25 (×3): qty 1

## 2016-05-25 MED ORDER — METOPROLOL TARTRATE 5 MG/5ML IV SOLN
5.0000 mg | Freq: Once | INTRAVENOUS | Status: AC
  Administered 2016-05-25: 5 mg via INTRAVENOUS
  Filled 2016-05-25: qty 5

## 2016-05-25 MED ORDER — LINACLOTIDE 145 MCG PO CAPS
145.0000 ug | ORAL_CAPSULE | Freq: Every day | ORAL | Status: DC | PRN
  Filled 2016-05-25: qty 1

## 2016-05-25 MED ORDER — POTASSIUM CHLORIDE 10 MEQ/100ML IV SOLN
10.0000 meq | INTRAVENOUS | Status: AC
  Administered 2016-05-25 (×6): 10 meq via INTRAVENOUS
  Filled 2016-05-25 (×6): qty 100

## 2016-05-25 MED ORDER — HEPARIN SODIUM (PORCINE) 5000 UNIT/ML IJ SOLN
5000.0000 [IU] | Freq: Three times a day (TID) | INTRAMUSCULAR | Status: DC
  Administered 2016-05-25 – 2016-05-26 (×4): 5000 [IU] via SUBCUTANEOUS
  Filled 2016-05-25 (×4): qty 1

## 2016-05-25 MED ORDER — ACETAMINOPHEN 650 MG RE SUPP
650.0000 mg | Freq: Four times a day (QID) | RECTAL | Status: DC | PRN
Start: 2016-05-25 — End: 2016-05-26

## 2016-05-25 MED ORDER — HYDRALAZINE HCL 20 MG/ML IJ SOLN: 10.0000 mg | INTRAMUSCULAR | Status: DC | PRN

## 2016-05-25 NOTE — Progress Notes (Addendum)
Patient seen and evaluated earlier the same by my associate. Please refer to H&P for details regarding assessment and plan.  Patient seen and evaluated. Gen.: Patient in no acute distress Cardiovascular: No cyanosis Pulmonary: No increased work of breathing  Will reassess next am  [place], [employer]

## 2016-05-25 NOTE — Progress Notes (Signed)
  Echocardiogram 2D Echocardiogram has been performed.  Amber [name] 05/25/2016, 2:37 PM

## 2016-05-25 NOTE — ED Notes (Signed)
20 min timer started.  

## 2016-05-25 NOTE — ED Notes (Signed)
In Ct  

## 2016-05-25 NOTE — H&P (Addendum)
History and Physical    Elfrida Lashway UJW:[id number] DOB: Aug 20, 1956 DOA: 05/24/2016  PCP: [name], MD  Patient coming from: Home.  Chief Complaint: Abdominal pain and distention.  HPI: Amber [name] is a 60 y.o. female with diabetes mellitus type 2, asthma on chronic steroids, hypertension, history of pancreatitis, chronic pain, grade 1 diastolic dysfunction presents to the ER because of persistent abdominal pain with distention. Patient has been having these symptoms for last 1 week. Has not had a bowel movement for last 5 days. Denies any vomiting. CT of the abdomen and pelvis done in the ER without contrast does not show anything acute. Patient had required multiple doses of pain medications. In addition patient is also hypokalemic. On exam patient has distended abdomen. I'm not able to appreciate bowel sounds. Abdominal pain is diffuse.. Patient's troponin is mildly elevated denies any chest pain or shortness of breath. Patient has significant lower extremity edema. Patient is being admitted for further management of abdominal pain with distention and hypokalemia.  ED Course: Patient was given fluids and pain medication.  Review of Systems: As per HPI, rest all negative.   Past Medical History  Diagnosis Date  . Arthritis   . Asthma   . Hypertension   . Constipation   . GERD (gastroesophageal reflux disease)   . Diabetes mellitus without complication (HCC)   . Chronic pain   . Seizures (HCC)     last seizure March 2015  . Pneumonia 10/2015  . Falls frequently     Past Surgical History  Procedure Laterality Date  . Back surgery    . Cholecystectomy    . Appendectomy       reports that she has quit smoking. She has never used smokeless tobacco. She reports that she does not drink alcohol or use illicit drugs.  Allergies  Allergen Reactions  . Ivp Dye [Iodinated Diagnostic Agents] Itching    Family History  Problem Relation Age of Onset  . Kidney failure Mother     . Hypertension Sister   . Hypertension Brother     Prior to Admission medications   Medication Sig Start Date End Date Taking? Authorizing Provider  albuterol (PROVENTIL HFA;VENTOLIN HFA) 108 (90 BASE) MCG/ACT inhaler Inhale 2 puffs into the lungs every 6 (six) hours as needed for wheezing or shortness of breath.   Yes Historical Provider, MD  amLODipine (NORVASC) 5 MG tablet Take 5 mg by mouth daily.   Yes Historical Provider, MD  aspirin EC 81 MG tablet Take 1 tablet (81 mg total) by mouth daily. 06/14/15  Yes Richarda Overlie, MD  atorvastatin (LIPITOR) 10 MG tablet Take 10 mg by mouth daily. 08/13/15  Yes Historical Provider, MD  cyclobenzaprine (FLEXERIL) 10 MG tablet Take 1 tablet (10 mg total) by mouth 2 (two) times daily as needed for muscle spasms. Patient taking differently: Take 10 mg by mouth 2 (two) times daily.  01/31/16  Yes Arthor Captain, PA-C  dextromethorphan-guaiFENesin (MUCINEX DM) 30-600 MG 12hr tablet Take 1 tablet by mouth 2 (two) times daily. Patient taking differently: Take 1 tablet by mouth 2 (two) times daily as needed.  01/02/16  Yes Vanetta Mulders, MD  diazepam (VALIUM) 5 MG tablet Take 1 tablet (5 mg total) by mouth 2 (two) times daily. 05/04/16  Yes Elson Areas, PA-C  feeding supplement, ENSURE ENLIVE, (ENSURE ENLIVE) LIQD Take 237 mLs by mouth 2 (two) times daily between meals. 11/10/15  Yes Catarina Hartshorn, MD  Fluticasone-Salmeterol (ADVAIR) 250-50 MCG/DOSE AEPB  Inhale 1 puff into the lungs 2 (two) times daily.   Yes Historical Provider, MD  folic acid (FOLVITE) 1 MG tablet Take 1 mg by mouth daily.   Yes Historical Provider, MD  furosemide (LASIX) 40 MG tablet Take 1 tablet (40 mg total) by mouth daily. 06/14/15  Yes Richarda Overlie, MD  gabapentin (NEURONTIN) 300 MG capsule Take 1 capsule (300 mg total) by mouth 2 (two) times daily. 06/14/15  Yes Richarda Overlie, MD  LINZESS 145 MCG CAPS capsule Take 145 mcg by mouth daily as needed (constipation).  08/13/15  Yes Historical  Provider, MD  metFORMIN (GLUCOPHAGE-XR) 500 MG 24 hr tablet Take 500 mg by mouth daily with breakfast.   Yes Historical Provider, MD  metoprolol succinate (TOPROL-XL) 25 MG 24 hr tablet Take 25 mg by mouth daily.   Yes Historical Provider, MD  OPANA ER, CRUSH RESISTANT, 40 MG T12A Take 1 tablet by mouth 2 (two) times daily. 05/19/16  Yes Historical Provider, MD  oxyCODONE 10 MG TABS Take 1 tablet (10 mg total) by mouth every 4 (four) hours as needed for severe pain. 03/01/16  Yes Richarda Overlie, MD  oxymorphone 20 MG T12A Take 20 mg by mouth every 12 (twelve) hours. 03/01/16  Yes Richarda Overlie, MD  pantoprazole (PROTONIX) 40 MG tablet Take 1 tablet (40 mg total) by mouth 2 (two) times daily before a meal. 12/25/15  Yes Ripudeep K Rai, MD  polyethylene glycol (MIRALAX / GLYCOLAX) packet Take 17 g by mouth 2 (two) times daily. 06/14/15  Yes Richarda Overlie, MD  potassium chloride SA (K-DUR,KLOR-CON) 20 MEQ tablet Take 2 tablets (40 mEq total) by mouth daily. 01/27/15  Yes Calvert Cantor, MD  predniSONE (DELTASONE) 10 MG tablet Take 15 mg by mouth daily with breakfast.   Yes Historical Provider, MD  promethazine (PHENERGAN) 25 MG tablet Take 1 tablet by mouth every 6 (six) hours as needed for nausea or vomiting. nausea 10/06/15  Yes Historical Provider, MD  tiZANidine (ZANAFLEX) 4 MG tablet Take 4 mg by mouth 4 (four) times daily. 08/13/15  Yes Historical Provider, MD  Vitamin D, Ergocalciferol, (DRISDOL) 50000 UNITS CAPS capsule Take 1 capsule (50,000 Units total) by mouth every 7 (seven) days. Sunday Patient taking differently: Take 50,000 Units by mouth every 7 (seven) days.  01/27/15  Yes Calvert Cantor, MD  VITAMIN E PO Take 1 capsule by mouth daily.   Yes Historical Provider, MD  levofloxacin (LEVAQUIN) 500 MG tablet Take 1 tablet (500 mg total) by mouth daily. 03/01/16   Richarda Overlie, MD    Physical Exam: Filed Vitals:   05/25/16 0430 05/25/16 0500 05/25/16 0530 05/25/16 0647  BP: 127/84 149/100 164/102 128/112    Pulse: 113 114 126 127  Temp:    98.2 F (36.8 C)  TempSrc:    Oral  Resp: 18 16 17 18   Height:    5' (1.524 m)  Weight:    143 lb 4.8 oz (65 kg)  SpO2: 94% 95% 96% 100%      Constitutional: Not in distress. Filed Vitals:   05/25/16 0430 05/25/16 0500 05/25/16 0530 05/25/16 0647  BP: 127/84 149/100 164/102 128/112  Pulse: 113 114 126 127  Temp:    98.2 F (36.8 C)  TempSrc:    Oral  Resp: 18 16 17 18   Height:    5' (1.524 m)  Weight:    143 lb 4.8 oz (65 kg)  SpO2: 94% 95% 96% 100%   Eyes: Anicteric no pallor. ENMT:  No discharge from the ears eyes nose or mouth. Neck: No mass felt. No neck rigidity, no JVD appreciated. Respiratory: No rhonchi or crepitations. Cardiovascular: S1 and S2 heard. Abdomen: Distended bowel sounds not appreciated nontender no guarding or rigidity. Musculoskeletal: Bilateral lower extremity edema. Skin: No rash. Neurologic: Alert awake oriented to time place and person. Moves all extremities. Psychiatric: Appears normal.   Labs on Admission: I have personally reviewed following labs and imaging studies  CBC:  Recent Labs Lab 05/19/16 2136 05/24/16 2338  WBC 8.8 11.0*  NEUTROABS  --  5.9  HGB 9.5* 10.3*  HCT 29.2* 32.0*  MCV 76.0* 75.1*  PLT 268 330   Basic Metabolic Panel:  Recent Labs Lab 05/19/16 2136 05/24/16 2338 05/25/16 0342  NA 138 139  --   K 3.0* 2.4* 2.9*  CL 109 103  --   CO2 21* 28  --   GLUCOSE 131* 128*  --   BUN 15 15  --   CREATININE 0.94 1.06*  --   CALCIUM 8.3* 8.5*  --    GFR: Estimated Creatinine Clearance: 49.3 mL/min (by C-G formula based on Cr of 1.06). Liver Function Tests:  Recent Labs Lab 05/24/16 2338  AST 26  ALT 24  ALKPHOS 146*  BILITOT 1.0  PROT 6.6  ALBUMIN 3.0*    Recent Labs Lab 05/24/16 2338  LIPASE 23   No results for input(s): AMMONIA in the last 168 hours. Coagulation Profile: No results for input(s): INR, PROTIME in the last 168 hours. Cardiac Enzymes:  Recent  Labs Lab 05/19/16 2136 05/24/16 2338 05/25/16 0230  TROPONINI 0.03* 0.04* 0.04*   BNP (last 3 results) No results for input(s): PROBNP in the last 8760 hours. HbA1C: No results for input(s): HGBA1C in the last 72 hours. CBG:  Recent Labs Lab 05/24/16 2214  GLUCAP 182*   Lipid Profile: No results for input(s): CHOL, HDL, LDLCALC, TRIG, CHOLHDL, LDLDIRECT in the last 72 hours. Thyroid Function Tests: No results for input(s): TSH, T4TOTAL, FREET4, T3FREE, THYROIDAB in the last 72 hours. Anemia Panel: No results for input(s): VITAMINB12, FOLATE, FERRITIN, TIBC, IRON, RETICCTPCT in the last 72 hours. Urine analysis:    Component Value Date/Time   COLORURINE YELLOW 05/25/2016 0301   APPEARANCEUR CLEAR 05/25/2016 0301   LABSPEC 1.013 05/25/2016 0301   PHURINE 6.5 05/25/2016 0301   GLUCOSEU NEGATIVE 05/25/2016 0301   HGBUR NEGATIVE 05/25/2016 0301   BILIRUBINUR NEGATIVE 05/25/2016 0301   KETONESUR NEGATIVE 05/25/2016 0301   PROTEINUR NEGATIVE 05/25/2016 0301   UROBILINOGEN 1.0 07/27/2015 1052   NITRITE NEGATIVE 05/25/2016 0301   LEUKOCYTESUR NEGATIVE 05/25/2016 0301   Sepsis Labs: @LABRCNTIP (procalcitonin:4,lacticidven:4) )No results found for this or any previous visit (from the past 240 hour(s)).   Radiological Exams on Admission: Ct Abdomen Pelvis Wo Contrast  05/25/2016  CLINICAL DATA:  Acute onset of generalized abdominal pain, chills, constipation, nausea and diaphoresis. Initial encounter. EXAM: CT ABDOMEN AND PELVIS WITHOUT CONTRAST TECHNIQUE: Multidetector CT imaging of the abdomen and pelvis was performed following the standard protocol without IV contrast. COMPARISON:  CT of the abdomen and pelvis from 05/04/2016 FINDINGS: Mild bibasilar atelectasis or scarring is noted. The liver and spleen are unremarkable in appearance. The patient is status post cholecystectomy, with clips noted at the gallbladder fossa. The pancreas and adrenal glands are unremarkable. Scattered  calcifications are seen within the pancreas, likely reflecting sequelae of chronic pancreatitis. The adrenal glands are unremarkable in appearance. The kidneys are unremarkable in appearance. There is no evidence  of hydronephrosis. No renal or ureteral stones are seen. Minimal nonspecific perinephric stranding is noted bilaterally. No free fluid is identified. The small bowel is unremarkable in appearance. The stomach is within normal limits. No acute vascular abnormalities are seen. Scattered calcification is seen along the abdominal aorta and its branches. The patient is status post appendectomy. Minimal diverticulosis is noted along the proximal sigmoid colon, without evidence of diverticulitis. The bladder is mildly distended and grossly unremarkable. The uterus is unremarkable in appearance. No suspicious adnexal masses are seen. No inguinal lymphadenopathy is seen. No acute osseous abnormalities are identified. Chronic loss of height is noted at vertebral bodies T12 and L1. There is mild grade 1 anterolisthesis of L4 on L5, with associated vacuum phenomenon and underlying facet disease. IMPRESSION: 1. No acute abnormality seen within the abdomen or pelvis. 2. Minimal diverticulosis along the proximal sigmoid colon, without evidence of diverticulitis. 3. Scattered calcifications within the pancreas likely reflects sequelae of chronic pancreatitis. 4. Mild bibasilar atelectasis or scarring noted. 5. Chronic loss of height at vertebral bodies T12 and L1. Electronically SignedRoanna Raiderery  Chang M.D.   On: 05/25/2016 01:57    EKG: Independently reviewed. Sinus tachycardia.  Assessment/Plan Principal Problem:   Intractable abdominal pain Active Problems:   Hypokalemia   Hypertension   Asthma   Type 2 diabetes mellitus with complication (HCC)   Chronic pain   Abdominal pain   Elevated troponin    1. Intractable abdominal pain - cause not clear, with differentials include diabetic gastroparesis  and possible acute on chronic pancreatitis. Since patient has not had any bowel movements I have ordered soapsuds enema 1 dose now. If pain persists may have a repeat CT abdomen and pelvis with contrast. May need NG tube if abdomen remains distended. She states she has had previous history of pancreatitis from diet. Check lipid panel for triglycerides. 2. Hypokalemia - probably from poor oral intake and Lasix. Replace and recheck check magnesium levels. 3. Elevated troponin - denies any chest pain. We will cycle cardiac markers checked 2D ECHO, per rectal aspirin I have ordered 1 dose of IV metoprolol continue patient's oral metoprolol and statins. 4. Diastolic dysfunction last EF measured was in 12/2015 was 70 to 75% - hesitant to give more fluids since patient has lower extremity edema on exam. 5. Diabetes mellitus type 2 - patient is being placed on sliding scale coverage. Last hemoglobin A1c 2 months ago was 5.5. 6. History of asthma on chronic steroids - since patient will not be able to reliably take orally I have placed patient on stress dose IV steroids continue inhalers. 7. Mild renal failure probably occluded from poor oral intake - patient did receive some IV fluids in the ER. Recheck metabolic panel. 8. Chronic pain - continue home medications. 9. Hypertension continue amlodipine.  Chest x-ray is pending.   DVT prophylaxis: Lovenox. Code Status: Full code.  Family Communication: No family at the bedside.  Disposition Plan: Home.  Consults called: None.  Admission status: Observation. Telemetry.    Eduard Clos MD [facility] Pager [phone].  If 7PM-7AM, please contact night-coverage [email or web address] Password [email or web address]  05/25/2016, 7:15 AM

## 2016-05-26 DIAGNOSIS — R109 Unspecified abdominal pain: Secondary | ICD-10-CM | POA: Diagnosis not present

## 2016-05-26 LAB — GLUCOSE, CAPILLARY
Glucose-Capillary: 107 mg/dL — ABNORMAL HIGH (ref 65–99)
Glucose-Capillary: 174 mg/dL — ABNORMAL HIGH (ref 65–99)
Glucose-Capillary: 96 mg/dL (ref 65–99)

## 2016-05-26 MED ORDER — FUROSEMIDE 40 MG PO TABS: 40.0000 mg | ORAL_TABLET | Freq: Every day | ORAL | Status: DC

## 2016-05-26 MED ORDER — POLYETHYLENE GLYCOL 3350 17 G PO PACK: 17.0000 g | PACK | Freq: Two times a day (BID) | ORAL | Status: DC

## 2016-05-26 MED ORDER — LINZESS 145 MCG PO CAPS: 145.0000 ug | ORAL_CAPSULE | Freq: Every day | ORAL | Status: DC | PRN

## 2016-05-26 MED ORDER — POTASSIUM CHLORIDE CRYS ER 20 MEQ PO TBCR
20.0000 meq | EXTENDED_RELEASE_TABLET | Freq: Once | ORAL | Status: AC
  Administered 2016-05-26: 20 meq via ORAL
  Filled 2016-05-26: qty 1

## 2016-05-26 NOTE — Progress Notes (Signed)
Pt vitals are WNL, tolerating diet and pain is under control. Discussed discharge instructions with patient. Discharged to home with prescriptions.

## 2016-05-26 NOTE — Discharge Summary (Signed)
Physician Discharge Summary  Amber [name] ZOX:[id number] DOB: Sep 28, 1956 DOA: 05/24/2016  PCP: [name], MD  Admit date: 05/24/2016 Discharge date: 05/26/2016  Time spent: > 35 minutes  Recommendations for Outpatient Follow-up:  Please ensure patient has laxatives while she is on opiod medications. Monitor K levels  Discharge Diagnoses:  Principal Problem:   Intractable abdominal pain Active Problems:   Hypokalemia   Hypertension   Asthma   Type 2 diabetes mellitus with complication (HCC)   Chronic pain   Abdominal pain   Elevated troponin   Discharge Condition: stable  Diet recommendation: diabetic diet  Filed Weights   05/25/16 0647 05/26/16 0433  Weight: 65 kg (143 lb 4.8 oz) 63.5 kg (139 lb 15.9 oz)    History of present illness:  60 y/o with history of chronic pain who ran out of her laxatives and presented to the hospital complaining of abdominal pain  Hospital Course:  Abdominal pain - most likely due to constipation as it resolved with bowel rest and laxatives - Lipase within normal limits - provide scripts for laxatives on d/c  Hypokalemia - most likely due to poor oral intake will wait a few days to start up lasix - administer k dur 20 meq prior to d/c  Procedures:  None  Consultations:  None  Discharge Exam: Filed Vitals:   05/25/16 2006 05/26/16 0433  BP: 122/79 134/91  Pulse: 102 94  Temp: 98.6 F (37 C) 97.8 F (36.6 C)  Resp: 22 20    General: Pt in nad, alert and awake Cardiovascular: rrr, no rubs Respiratory: no increased wob, no wheezes  Discharge Instructions   Discharge Instructions    Call MD for:  difficulty breathing, headache or visual disturbances    Complete by:  As directed      Call MD for:  persistant nausea and vomiting    Complete by:  As directed      Call MD for:  severe uncontrolled pain    Complete by:  As directed      Diet - low sodium heart healthy    Complete by:  As directed      Discharge  instructions    Complete by:  As directed   Please be sure to follow up with your primary care physician in 1-2 weeks or sooner should any new concerns arise.     Increase activity slowly    Complete by:  As directed           Current Discharge Medication List    CONTINUE these medications which have CHANGED   Details  furosemide (LASIX) 40 MG tablet Take 1 tablet (40 mg total) by mouth daily. Qty: 30 tablet, Refills: 1    LINZESS 145 MCG CAPS capsule Take 1 capsule (145 mcg total) by mouth daily as needed (constipation). Qty: 30 capsule, Refills: 0    polyethylene glycol (MIRALAX / GLYCOLAX) packet Take 17 g by mouth 2 (two) times daily. Qty: 14 each, Refills: 0      CONTINUE these medications which have NOT CHANGED   Details  albuterol (PROVENTIL HFA;VENTOLIN HFA) 108 (90 BASE) MCG/ACT inhaler Inhale 2 puffs into the lungs every 6 (six) hours as needed for wheezing or shortness of breath.    amLODipine (NORVASC) 5 MG tablet Take 5 mg by mouth daily.    aspirin EC 81 MG tablet Take 1 tablet (81 mg total) by mouth daily. Qty: 30 tablet, Refills: 2    atorvastatin (LIPITOR) 10 MG tablet Take  10 mg by mouth daily. Refills: 5    diazepam (VALIUM) 5 MG tablet Take 1 tablet (5 mg total) by mouth 2 (two) times daily. Qty: 14 tablet, Refills: 0    feeding supplement, ENSURE ENLIVE, (ENSURE ENLIVE) LIQD Take 237 mLs by mouth 2 (two) times daily between meals. Qty: 60 Bottle, Refills: 0    Fluticasone-Salmeterol (ADVAIR) 250-50 MCG/DOSE AEPB Inhale 1 puff into the lungs 2 (two) times daily.    folic acid (FOLVITE) 1 MG tablet Take 1 mg by mouth daily.    gabapentin (NEURONTIN) 300 MG capsule Take 1 capsule (300 mg total) by mouth 2 (two) times daily. Qty: 60 capsule, Refills: 0    metFORMIN (GLUCOPHAGE-XR) 500 MG 24 hr tablet Take 500 mg by mouth daily with breakfast.    metoprolol succinate (TOPROL-XL) 25 MG 24 hr tablet Take 25 mg by mouth daily.    oxymorphone 20 MG T12A  Take 20 mg by mouth every 12 (twelve) hours. Qty: 10 tablet, Refills: 0    pantoprazole (PROTONIX) 40 MG tablet Take 1 tablet (40 mg total) by mouth 2 (two) times daily before a meal. Qty: 60 tablet, Refills: 3    potassium chloride SA (K-DUR,KLOR-CON) 20 MEQ tablet Take 2 tablets (40 mEq total) by mouth daily. Qty: 30 tablet, Refills: 0    predniSONE (DELTASONE) 10 MG tablet Take 15 mg by mouth daily with breakfast.    promethazine (PHENERGAN) 25 MG tablet Take 1 tablet by mouth every 6 (six) hours as needed for nausea or vomiting. nausea Refills: 0    Vitamin D, Ergocalciferol, (DRISDOL) 50000 UNITS CAPS capsule Take 1 capsule (50,000 Units total) by mouth every 7 (seven) days. Sunday Qty: 30 capsule, Refills: 0    VITAMIN E PO Take 1 capsule by mouth daily.      STOP taking these medications     cyclobenzaprine (FLEXERIL) 10 MG tablet      dextromethorphan-guaiFENesin (MUCINEX DM) 30-600 MG 12hr tablet      oxyCODONE 10 MG TABS      tiZANidine (ZANAFLEX) 4 MG tablet      levofloxacin (LEVAQUIN) 500 MG tablet        Allergies  Allergen Reactions  . Ivp Dye [Iodinated Diagnostic Agents] Itching      The results of significant diagnostics from this hospitalization (including imaging, microbiology, ancillary and laboratory) are listed below for reference.    Significant Diagnostic Studies: Ct Abdomen Pelvis Wo Contrast  05/25/2016  CLINICAL DATA:  Acute onset of generalized abdominal pain, chills, constipation, nausea and diaphoresis. Initial encounter. EXAM: CT ABDOMEN AND PELVIS WITHOUT CONTRAST TECHNIQUE: Multidetector CT imaging of the abdomen and pelvis was performed following the standard protocol without IV contrast. COMPARISON:  CT of the abdomen and pelvis from 05/04/2016 FINDINGS: Mild bibasilar atelectasis or scarring is noted. The liver and spleen are unremarkable in appearance. The patient is status post cholecystectomy, with clips noted at the gallbladder  fossa. The pancreas and adrenal glands are unremarkable. Scattered calcifications are seen within the pancreas, likely reflecting sequelae of chronic pancreatitis. The adrenal glands are unremarkable in appearance. The kidneys are unremarkable in appearance. There is no evidence of hydronephrosis. No renal or ureteral stones are seen. Minimal nonspecific perinephric stranding is noted bilaterally. No free fluid is identified. The small bowel is unremarkable in appearance. The stomach is within normal limits. No acute vascular abnormalities are seen. Scattered calcification is seen along the abdominal aorta and its branches. The patient is status post appendectomy. Minimal  diverticulosis is noted along the proximal sigmoid colon, without evidence of diverticulitis. The bladder is mildly distended and grossly unremarkable. The uterus is unremarkable in appearance. No suspicious adnexal masses are seen. No inguinal lymphadenopathy is seen. No acute osseous abnormalities are identified. Chronic loss of height is noted at vertebral bodies T12 and L1. There is mild grade 1 anterolisthesis of L4 on L5, with associated vacuum phenomenon and underlying facet disease. IMPRESSION: 1. No acute abnormality seen within the abdomen or pelvis. 2. Minimal diverticulosis along the proximal sigmoid colon, without evidence of diverticulitis. 3. Scattered calcifications within the pancreas likely reflects sequelae of chronic pancreatitis. 4. Mild bibasilar atelectasis or scarring noted. 5. Chronic loss of height at vertebral bodies T12 and L1. Electronically Signed   By: Roanna Raider M.D.   On: 05/25/2016 01:57   Ct Abdomen Pelvis Wo Contrast  05/04/2016  CLINICAL DATA:  Bilateral shoulder pain, low back pain, fell on Wednesday and last Friday EXAM: CT CHEST, ABDOMEN AND PELVIS WITHOUT CONTRAST TECHNIQUE: Multidetector CT imaging of the chest, abdomen and pelvis was performed following the standard protocol without IV contrast.  COMPARISON:  None. FINDINGS: CT CHEST FINDINGS Mediastinum/Lymph Nodes: Images of the thoracic inlet are unremarkable. Central airways are patent. Atherosclerotic calcifications of thoracic aorta and coronary arteries. No aortic aneurysm. No mediastinal hematoma or adenopathy. Heart size within normal limits. No pericardial effusion. Lungs/Pleura: Images of the lung parenchyma shows no acute infiltrate or pulmonary edema. Stable scarring in right lower lobe posterior medially prior spinal. Stable bilateral lower lobe anterolateral peripheral scarring. There is no lung contusion or pneumothorax. No segmental consolidation. Musculoskeletal: Stable old fracture deformity of the right third and fourth rib. Again noted postsurgical changes with T4 and T5 corpectomy and solid fusion. The alignment is preserved. Stable remote compression fracture of T11 vertebral body. Sagittal images of the spine shows no acute fracture is. Sagittal view of the sternum is unremarkable. No scapular fracture is noted. Stable deformity of the left fifth rib. Stable posterior deformity of the left seventh rib. No acute rib fractures are noted. CT ABDOMEN PELVIS FINDINGS Hepatobiliary: Study is limited without IV contrast. The unenhanced liver shows no biliary ductal dilatation. Status postcholecystectomy Pancreas: Again noted chronic pancreatic calcifications consistent with chronic calcific pancreatitis. No dilatation of main pancreatic duct. Spleen: Unenhanced spleen shows no evidence of laceration. Adrenals/Urinary Tract: No adrenal gland mass is noted. Unenhanced kidneys are symmetrical in size. No nephrolithiasis. No hydronephrosis or hydroureter. The urinary bladder is unremarkable. Stomach/Bowel: There is no small bowel obstruction. No thickened or dilated small bowel loops. The terminal ileum is unremarkable. Moderate stool noted within cecum and right colon. Moderate stool and gas noted within transverse colon. There is no pericecal  inflammation. The patient is status post appendectomy. Scattered diverticula are noted descending colon. No evidence of acute diverticulitis. Scattered diverticula are noted within proximal sigmoid colon. No evidence of acute colitis or diverticulitis. Moderate stool noted within redundant sigmoid colon. Vascular/Lymphatic: No retroperitoneal or mesenteric adenopathy. Atherosclerotic calcifications of abdominal aorta and iliac arteries. No aortic aneurysm. Reproductive: The uterus and ovaries are normal for age. Atherosclerotic adnexal vascular calcifications are noted. Other: No ascites or free abdominal air. There is a small left inguinal region hernia containing fat measures 2 cm without evidence of acute complication. Musculoskeletal: Sagittal and coronal images of the thoracic and lumbar spine submitted. Again no evidence of acute fracture or subluxation. Stable postsurgical changes upper thoracic spine. Stable compression deformity T11 vertebral body. There is diffuse osteopenia.  No pelvic fractures are noted. There are degenerative changes bilateral SI joints. Mild degenerative changes pubic symphysis. Coronal images shows no hip fracture. Bilateral hip symmetrical in appearance. Sagittal images of the lumbar spine shows about 3 mm anterolisthesis L4 on L5 vertebral body. There is disc space flattening with vacuum disc phenomenon at L4-L5 level. There is interval mild compression deformity lower endplate of T12 vertebral body. This is of indeterminate age. No evidence of bony protrusion in spinal canal at this level. She IMPRESSION: 1. There is no lung contusion or pneumothorax. 2. Stable postsurgical changes upper thoracic spine. Stable compression deformity T11 vertebral body. There is interval mild compression deformity lower endplate of T12 vertebral body of indeterminate age. Clinical correlation is necessary. The spinal canal is patent at this level. No evidence of bony protrusion. 3. No mediastinal  hematoma or adenopathy. 4. No hydronephrosis or hydroureter. 5. No ascites or free abdominal air. 6. No evidence of urinary bladder injury. 7. Moderate stool noted in right colon and transverse colon. Colonic diverticula are noted descending colon and sigmoid colon. No evidence of acute diverticulitis. Moderate stool noted in redundant sigmoid colon. 8. No pelvic fractures are noted. 9. Degenerative changes lumbar spine at L4-L5 level. Mild anterolisthesis about 3 mm L4 on L5 vertebral body. Disc space flattening with vacuum disc phenomenon at L4-L5 level. Electronically Signed   By: Natasha Mead M.D.   On: 05/04/2016 14:57   Dg Chest 2 View  05/19/2016  CLINICAL DATA:  60 year old female with chest pain for 2 weeks. Patient had a bony spurring with worsening back pain right shoulder pain. EXAM: CHEST  2 VIEW COMPARISON:  Chest radiograph dated 03/01/2016 and CT dated 05/04/2016 FINDINGS: Two views of the chest demonstrate patchy area airspace density at the left lung base which may represent atelectasis/scarring or developing pneumonia. There is blunting of the left costophrenic angle concerning for a small pleural effusion versus pleural scarring. The right lung is clear. Top-normal cardiac silhouette. There is osteopenia. Evaluation for fracture acute fracture is limited due to advanced osteopenia. There multilevel age indeterminate compression deformities of the thoracic spine. There has been interval progression of the compression fractures of the lower thoracic vertebrae. Posterior fusion metallic hardware as well as upper thoracic corpectomy. IMPRESSION: Left lung base atelectasis versus infiltrate. Pleural scarring versus trace left pleural effusion. Advanced osteopenia with multilevel age indeterminate compression deformity of the thoracic spine. There has been interval progression of the lower thoracic compression fractures compared to the prior study. Clinical correlation is recommended. First  Electronically Signed   By: Elgie Collard M.D.   On: 05/19/2016 22:34   Dg Thoracic Spine 2 View  05/19/2016  CLINICAL DATA:  60 year old female with chest pain for 2 weeks. Patient had a bony spurring with worsening back pain right shoulder pain. EXAM: CHEST  2 VIEW COMPARISON:  Chest radiograph dated 03/01/2016 and CT dated 05/04/2016 FINDINGS: Two views of the chest demonstrate patchy area airspace density at the left lung base which may represent atelectasis/scarring or developing pneumonia. There is blunting of the left costophrenic angle concerning for a small pleural effusion versus pleural scarring. The right lung is clear. Top-normal cardiac silhouette. There is osteopenia. Evaluation for fracture acute fracture is limited due to advanced osteopenia. There multilevel age indeterminate compression deformities of the thoracic spine. There has been interval progression of the compression fractures of the lower thoracic vertebrae. Posterior fusion metallic hardware as well as upper thoracic corpectomy. IMPRESSION: Left lung base atelectasis versus infiltrate.  Pleural scarring versus trace left pleural effusion. Advanced osteopenia with multilevel age indeterminate compression deformity of the thoracic spine. There has been interval progression of the lower thoracic compression fractures compared to the prior study. Clinical correlation is recommended. First Electronically Signed   By: Elgie Collard M.D.   On: 05/19/2016 22:34   Dg Shoulder Right  05/19/2016  CLINICAL DATA:  Shoulder pain.  Fall yesterday EXAM: RIGHT SHOULDER - 2+ VIEW COMPARISON:  None. FINDINGS: Mild degenerative change in the glenohumeral joint. Rotator cuff impingement anatomy with narrowing of the acromial humeral distance. Inferior displacement of the acromion relative to the clavicle may represent grade 2 AC separation. This is not identified on the prior chest x-ray. No prior imaging of the right shoulder. This could be acute  or chronic. Negative for fracture. IMPRESSION: Negative for fracture AC separation Electronically Signed   By: Marlan Palau M.D.   On: 05/19/2016 22:34   Dg Elbow Complete Right  05/04/2016  CLINICAL DATA:  Fall yesterday with posterior right elbow pain, initial encounter EXAM: RIGHT ELBOW - COMPLETE 3+ VIEW COMPARISON:  None. FINDINGS: There is no evidence of fracture, dislocation, or joint effusion. There is no evidence of arthropathy or other focal bone abnormality. Soft tissues are unremarkable. IMPRESSION: No acute abnormality noted. Electronically Signed   By: Alcide Clever M.D.   On: 05/04/2016 15:34   Ct Chest Wo Contrast  05/04/2016  CLINICAL DATA:  Bilateral shoulder pain, low back pain, fell on Wednesday and last Friday EXAM: CT CHEST, ABDOMEN AND PELVIS WITHOUT CONTRAST TECHNIQUE: Multidetector CT imaging of the chest, abdomen and pelvis was performed following the standard protocol without IV contrast. COMPARISON:  None. FINDINGS: CT CHEST FINDINGS Mediastinum/Lymph Nodes: Images of the thoracic inlet are unremarkable. Central airways are patent. Atherosclerotic calcifications of thoracic aorta and coronary arteries. No aortic aneurysm. No mediastinal hematoma or adenopathy. Heart size within normal limits. No pericardial effusion. Lungs/Pleura: Images of the lung parenchyma shows no acute infiltrate or pulmonary edema. Stable scarring in right lower lobe posterior medially prior spinal. Stable bilateral lower lobe anterolateral peripheral scarring. There is no lung contusion or pneumothorax. No segmental consolidation. Musculoskeletal: Stable old fracture deformity of the right third and fourth rib. Again noted postsurgical changes with T4 and T5 corpectomy and solid fusion. The alignment is preserved. Stable remote compression fracture of T11 vertebral body. Sagittal images of the spine shows no acute fracture is. Sagittal view of the sternum is unremarkable. No scapular fracture is noted.  Stable deformity of the left fifth rib. Stable posterior deformity of the left seventh rib. No acute rib fractures are noted. CT ABDOMEN PELVIS FINDINGS Hepatobiliary: Study is limited without IV contrast. The unenhanced liver shows no biliary ductal dilatation. Status postcholecystectomy Pancreas: Again noted chronic pancreatic calcifications consistent with chronic calcific pancreatitis. No dilatation of main pancreatic duct. Spleen: Unenhanced spleen shows no evidence of laceration. Adrenals/Urinary Tract: No adrenal gland mass is noted. Unenhanced kidneys are symmetrical in size. No nephrolithiasis. No hydronephrosis or hydroureter. The urinary bladder is unremarkable. Stomach/Bowel: There is no small bowel obstruction. No thickened or dilated small bowel loops. The terminal ileum is unremarkable. Moderate stool noted within cecum and right colon. Moderate stool and gas noted within transverse colon. There is no pericecal inflammation. The patient is status post appendectomy. Scattered diverticula are noted descending colon. No evidence of acute diverticulitis. Scattered diverticula are noted within proximal sigmoid colon. No evidence of acute colitis or diverticulitis. Moderate stool noted within redundant sigmoid colon.  Vascular/Lymphatic: No retroperitoneal or mesenteric adenopathy. Atherosclerotic calcifications of abdominal aorta and iliac arteries. No aortic aneurysm. Reproductive: The uterus and ovaries are normal for age. Atherosclerotic adnexal vascular calcifications are noted. Other: No ascites or free abdominal air. There is a small left inguinal region hernia containing fat measures 2 cm without evidence of acute complication. Musculoskeletal: Sagittal and coronal images of the thoracic and lumbar spine submitted. Again no evidence of acute fracture or subluxation. Stable postsurgical changes upper thoracic spine. Stable compression deformity T11 vertebral body. There is diffuse osteopenia. No  pelvic fractures are noted. There are degenerative changes bilateral SI joints. Mild degenerative changes pubic symphysis. Coronal images shows no hip fracture. Bilateral hip symmetrical in appearance. Sagittal images of the lumbar spine shows about 3 mm anterolisthesis L4 on L5 vertebral body. There is disc space flattening with vacuum disc phenomenon at L4-L5 level. There is interval mild compression deformity lower endplate of T12 vertebral body. This is of indeterminate age. No evidence of bony protrusion in spinal canal at this level. She IMPRESSION: 1. There is no lung contusion or pneumothorax. 2. Stable postsurgical changes upper thoracic spine. Stable compression deformity T11 vertebral body. There is interval mild compression deformity lower endplate of T12 vertebral body of indeterminate age. Clinical correlation is necessary. The spinal canal is patent at this level. No evidence of bony protrusion. 3. No mediastinal hematoma or adenopathy. 4. No hydronephrosis or hydroureter. 5. No ascites or free abdominal air. 6. No evidence of urinary bladder injury. 7. Moderate stool noted in right colon and transverse colon. Colonic diverticula are noted descending colon and sigmoid colon. No evidence of acute diverticulitis. Moderate stool noted in redundant sigmoid colon. 8. No pelvic fractures are noted. 9. Degenerative changes lumbar spine at L4-L5 level. Mild anterolisthesis about 3 mm L4 on L5 vertebral body. Disc space flattening with vacuum disc phenomenon at L4-L5 level. Electronically Signed   By: Natasha Mead M.D.   On: 05/04/2016 14:57   Ct 3d Recon At Scanner  05/04/2016  CLINICAL DATA:  Bilateral shoulder pain, low back pain, fell on Wednesday and last Friday EXAM: CT CHEST, ABDOMEN AND PELVIS WITHOUT CONTRAST TECHNIQUE: Multidetector CT imaging of the chest, abdomen and pelvis was performed following the standard protocol without IV contrast. COMPARISON:  None. FINDINGS: CT CHEST FINDINGS  Mediastinum/Lymph Nodes: Images of the thoracic inlet are unremarkable. Central airways are patent. Atherosclerotic calcifications of thoracic aorta and coronary arteries. No aortic aneurysm. No mediastinal hematoma or adenopathy. Heart size within normal limits. No pericardial effusion. Lungs/Pleura: Images of the lung parenchyma shows no acute infiltrate or pulmonary edema. Stable scarring in right lower lobe posterior medially prior spinal. Stable bilateral lower lobe anterolateral peripheral scarring. There is no lung contusion or pneumothorax. No segmental consolidation. Musculoskeletal: Stable old fracture deformity of the right third and fourth rib. Again noted postsurgical changes with T4 and T5 corpectomy and solid fusion. The alignment is preserved. Stable remote compression fracture of T11 vertebral body. Sagittal images of the spine shows no acute fracture is. Sagittal view of the sternum is unremarkable. No scapular fracture is noted. Stable deformity of the left fifth rib. Stable posterior deformity of the left seventh rib. No acute rib fractures are noted. CT ABDOMEN PELVIS FINDINGS Hepatobiliary: Study is limited without IV contrast. The unenhanced liver shows no biliary ductal dilatation. Status postcholecystectomy Pancreas: Again noted chronic pancreatic calcifications consistent with chronic calcific pancreatitis. No dilatation of main pancreatic duct. Spleen: Unenhanced spleen shows no evidence of laceration. Adrenals/Urinary  Tract: No adrenal gland mass is noted. Unenhanced kidneys are symmetrical in size. No nephrolithiasis. No hydronephrosis or hydroureter. The urinary bladder is unremarkable. Stomach/Bowel: There is no small bowel obstruction. No thickened or dilated small bowel loops. The terminal ileum is unremarkable. Moderate stool noted within cecum and right colon. Moderate stool and gas noted within transverse colon. There is no pericecal inflammation. The patient is status post  appendectomy. Scattered diverticula are noted descending colon. No evidence of acute diverticulitis. Scattered diverticula are noted within proximal sigmoid colon. No evidence of acute colitis or diverticulitis. Moderate stool noted within redundant sigmoid colon. Vascular/Lymphatic: No retroperitoneal or mesenteric adenopathy. Atherosclerotic calcifications of abdominal aorta and iliac arteries. No aortic aneurysm. Reproductive: The uterus and ovaries are normal for age. Atherosclerotic adnexal vascular calcifications are noted. Other: No ascites or free abdominal air. There is a small left inguinal region hernia containing fat measures 2 cm without evidence of acute complication. Musculoskeletal: Sagittal and coronal images of the thoracic and lumbar spine submitted. Again no evidence of acute fracture or subluxation. Stable postsurgical changes upper thoracic spine. Stable compression deformity T11 vertebral body. There is diffuse osteopenia. No pelvic fractures are noted. There are degenerative changes bilateral SI joints. Mild degenerative changes pubic symphysis. Coronal images shows no hip fracture. Bilateral hip symmetrical in appearance. Sagittal images of the lumbar spine shows about 3 mm anterolisthesis L4 on L5 vertebral body. There is disc space flattening with vacuum disc phenomenon at L4-L5 level. There is interval mild compression deformity lower endplate of T12 vertebral body. This is of indeterminate age. No evidence of bony protrusion in spinal canal at this level. She IMPRESSION: 1. There is no lung contusion or pneumothorax. 2. Stable postsurgical changes upper thoracic spine. Stable compression deformity T11 vertebral body. There is interval mild compression deformity lower endplate of T12 vertebral body of indeterminate age. Clinical correlation is necessary. The spinal canal is patent at this level. No evidence of bony protrusion. 3. No mediastinal hematoma or adenopathy. 4. No hydronephrosis  or hydroureter. 5. No ascites or free abdominal air. 6. No evidence of urinary bladder injury. 7. Moderate stool noted in right colon and transverse colon. Colonic diverticula are noted descending colon and sigmoid colon. No evidence of acute diverticulitis. Moderate stool noted in redundant sigmoid colon. 8. No pelvic fractures are noted. 9. Degenerative changes lumbar spine at L4-L5 level. Mild anterolisthesis about 3 mm L4 on L5 vertebral body. Disc space flattening with vacuum disc phenomenon at L4-L5 level. Electronically Signed   By: Natasha Mead M.D.   On: 05/04/2016 14:57   Dg Chest Port 1 View  05/25/2016  CLINICAL DATA:  60 year old female with elevated troponin. Initial encounter. EXAM: PORTABLE CHEST 1 VIEW COMPARISON:  05/19/2016 and earlier. FINDINGS: Portable AP semi upright view at 0725 hours. Thoracic spine hardware appears stable. Stable lung volumes. Mediastinal contours are stable and within normal limits. No pneumothorax. No pulmonary edema. Stable costophrenic angle blunting with no definite pleural effusion. Reticulonodular opacity in the right upper lobe appears chronic. Associated chronic right lateral rib deformities. No definite acute pulmonary opacity. Stable cholecystectomy clips. IMPRESSION: Stable chest.  No acute cardiopulmonary abnormality identified. Electronically Signed   By: Odessa Fleming M.D.   On: 05/25/2016 07:59    Microbiology: No results found for this or any previous visit (from the past 240 hour(s)).   Labs: Basic Metabolic Panel:  Recent Labs Lab 05/19/16 2136 05/24/16 2338 05/25/16 0342 05/25/16 0728 05/25/16 1240  NA 138 139  --   --  140  K 3.0* 2.4* 2.9*  --  3.2*  CL 109 103  --   --  108  CO2 21* 28  --   --  26  GLUCOSE 131* 128*  --   --  126*  BUN 15 15  --   --  10  CREATININE 0.94 1.06*  --  0.86 0.78  CALCIUM 8.3* 8.5*  --   --  7.8*  MG  --   --   --   --  1.1*   Liver Function Tests:  Recent Labs Lab 05/24/16 2338 05/25/16 1240   AST 26 25  ALT 24 21  ALKPHOS 146* 123  BILITOT 1.0 1.2  PROT 6.6 6.2*  ALBUMIN 3.0* 2.7*    Recent Labs Lab 05/24/16 2338  LIPASE 23   No results for input(s): AMMONIA in the last 168 hours. CBC:  Recent Labs Lab 05/19/16 2136 05/24/16 2338 05/25/16 0728 05/25/16 1240  WBC 8.8 11.0* 7.6 7.5  NEUTROABS  --  5.9  --  6.5  HGB 9.5* 10.3* 9.2* 9.6*  HCT 29.2* 32.0* 29.0* 30.4*  MCV 76.0* 75.1* 75.5* 77.2*  PLT 268 330 293 305   Cardiac Enzymes:  Recent Labs Lab 05/24/16 2338 05/25/16 0230 05/25/16 0728 05/25/16 1240 05/25/16 1858  TROPONINI 0.04* 0.04* 0.04* 0.04* 0.03*   BNP: BNP (last 3 results)  Recent Labs  07/27/15 0150 12/24/15 0450 05/24/16 2338  BNP 41.3 427.3* 50.6    ProBNP (last 3 results) No results for input(s): PROBNP in the last 8760 hours.  CBG:  Recent Labs Lab 05/25/16 1728 05/25/16 2002 05/26/16 0049 05/26/16 0429 05/26/16 0742  GLUCAP 307* 244* 174* 107* 96     Signed:  Penny Pia MD.  [facility] 05/26/2016, 10:15 AM

## 2016-06-20 IMAGING — DX DG ELBOW COMPLETE 3+V*R*
4 series · 4 of 4 positions shown · non-contrast
Comparison: None.

CLINICAL DATA: Fall yesterday with posterior right elbow pain,
initial encounter

EXAM:
RIGHT ELBOW - COMPLETE 3+ VIEW

[elbow ap]
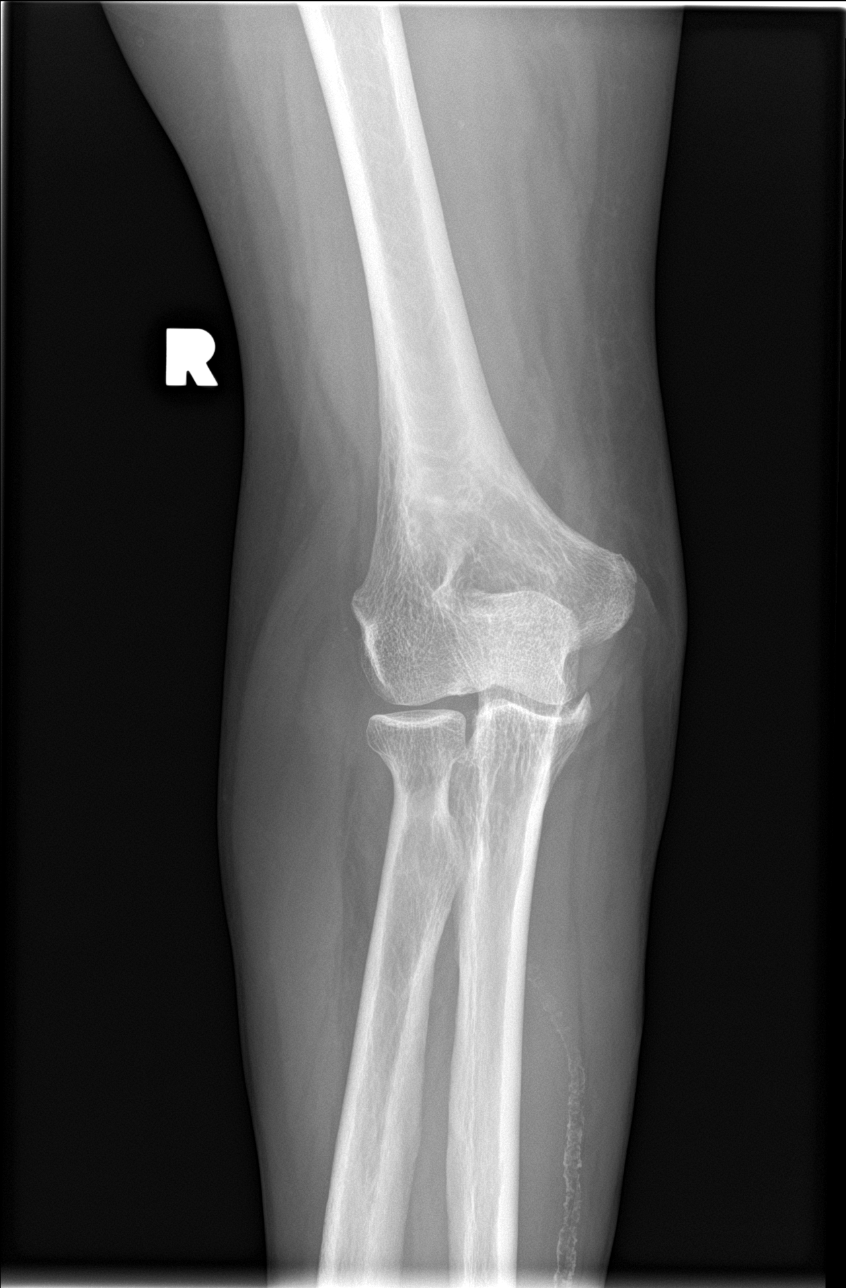

[elbow obl (1 of 2)]
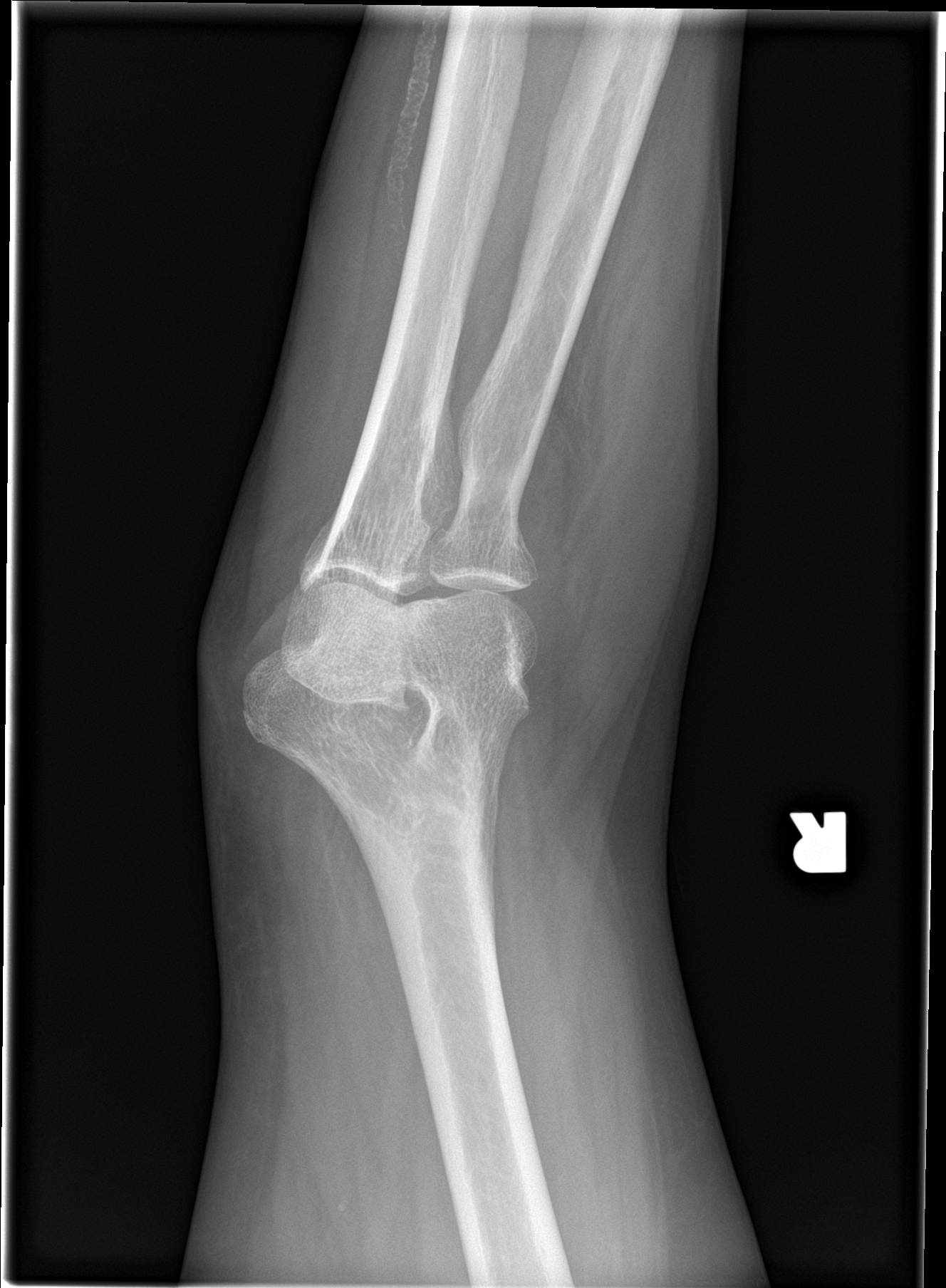

[elbow obl (2 of 2)]
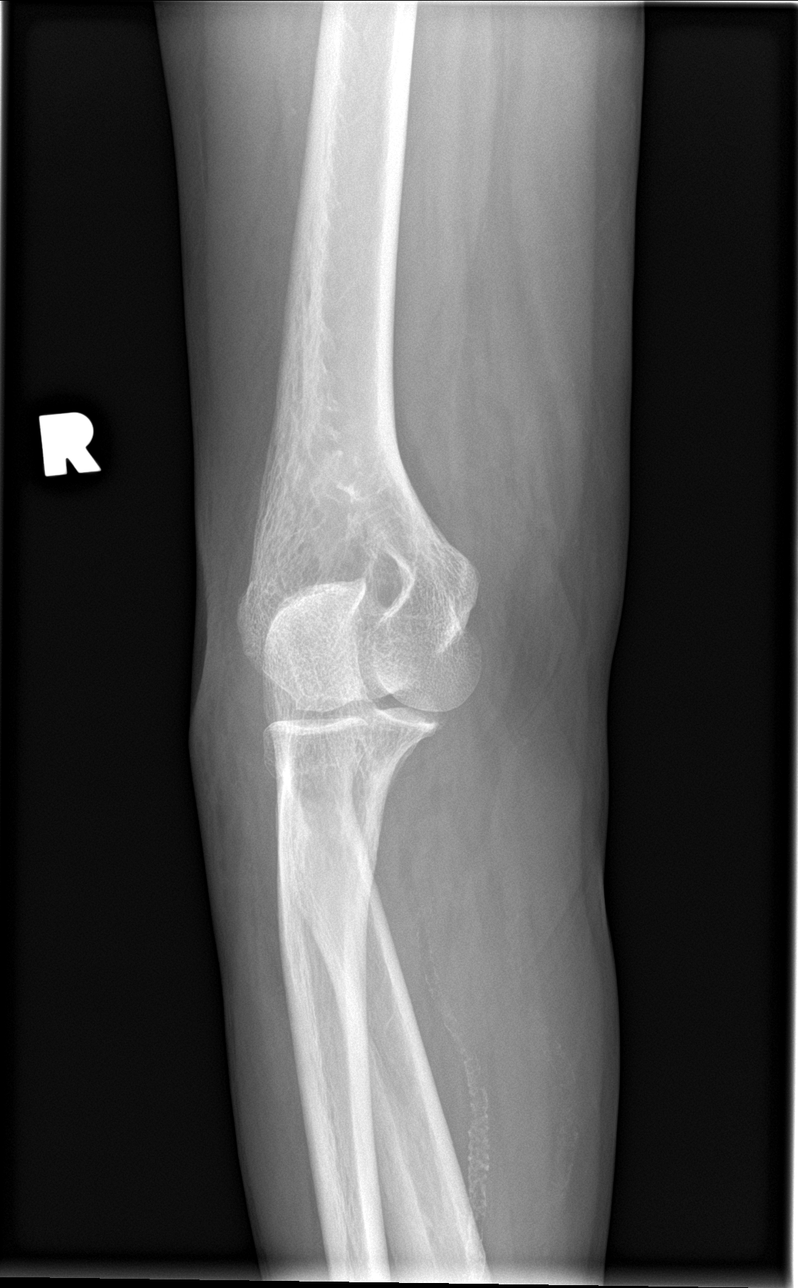

[elbow lat]
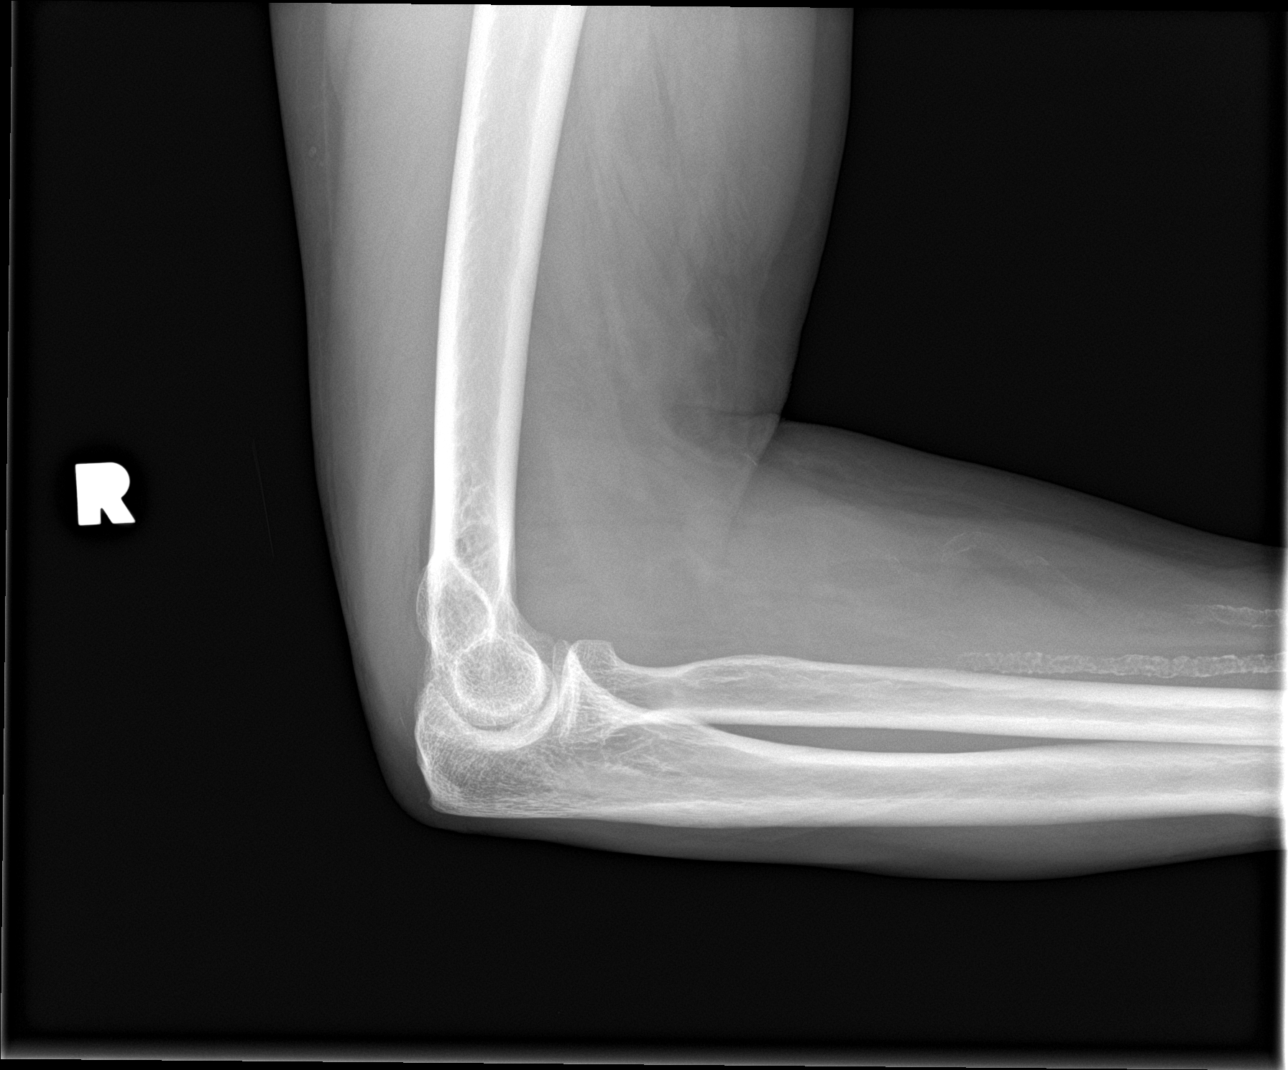

[4 of 4 positions shown; findings below may reference images not displayed]

FINDINGS: There is no evidence of fracture, dislocation, or joint effusion.
There is no evidence of arthropathy or other focal bone abnormality.
Soft tissues are unremarkable.
IMPRESSION: No acute abnormality noted.

## 2016-06-20 IMAGING — CT CT 3D ACQUISTION WKST
5 of 9 series · 15 of 36 positions shown, 16 images · non-contrast
Comparison: None.

CLINICAL DATA: Bilateral shoulder pain, low back pain, fell on
[REDACTED] and [REDACTED]

EXAM:
CT CHEST, ABDOMEN AND PELVIS WITHOUT CONTRAST
TECHNIQUE: Multidetector CT imaging of the chest, abdomen and pelvis was
performed following the standard protocol without IV contrast.

[Series 2: axial st · axial · 0.81mm/px · z∈[-423,-143]mm · 3 of 113 slices shown, 4 images (1 of 4)]
[im 29/113  mediastinal]
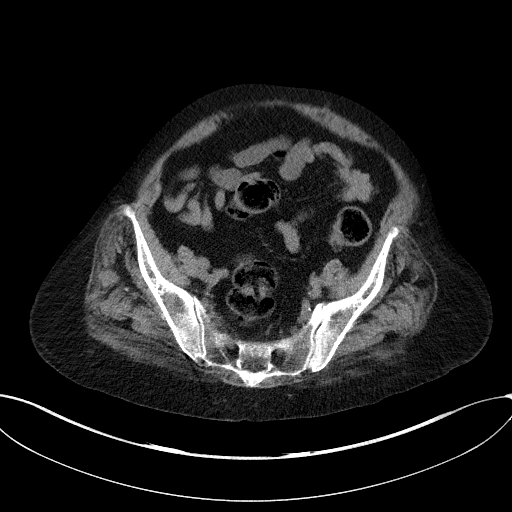
[im 29/113  lung]
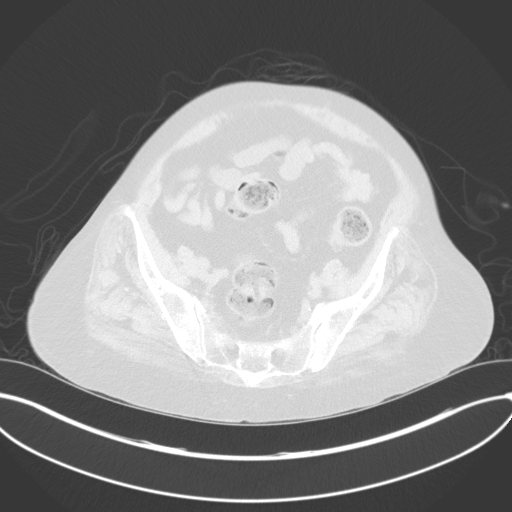
[im 57/113  lung]
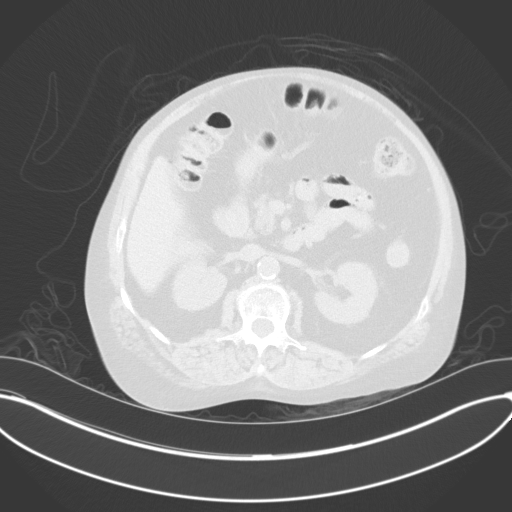
[im 85/113  lung]
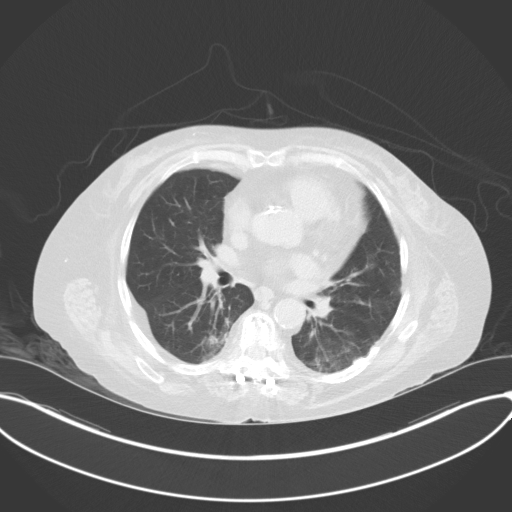

[Series 4: coronal · coronal · 0.90mm/px · 2 of 161 slices shown]
[im 54/161  lung]
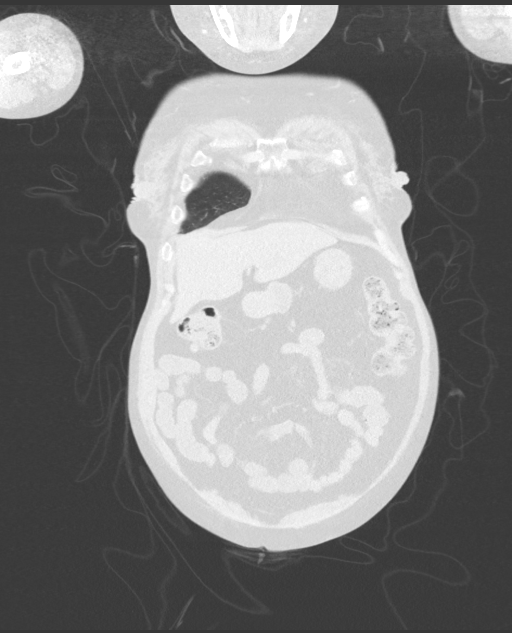
[im 107/161  lung]
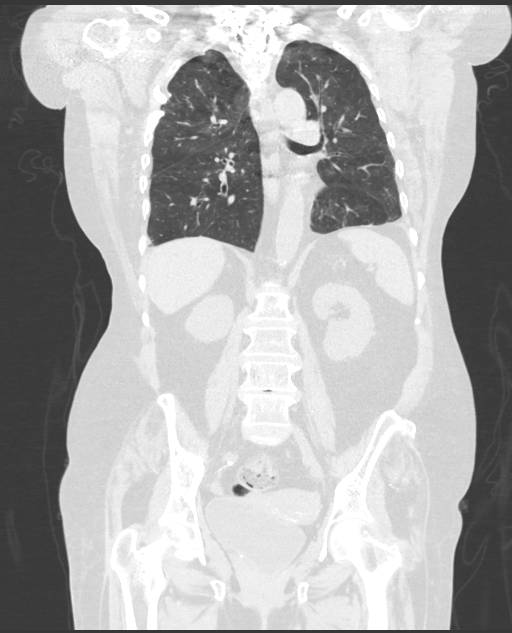

[Series 7: axial st · axial · 0.29mm/px · z∈[-227,-59]mm · 4 of 141 slices shown (2 of 4)]
[im 29/141  lung]
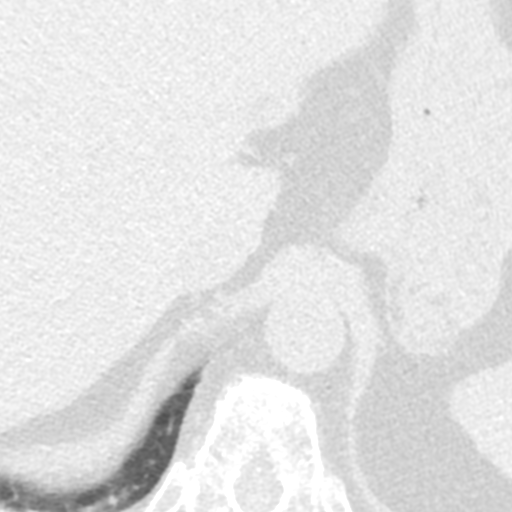
[im 57/141  lung]
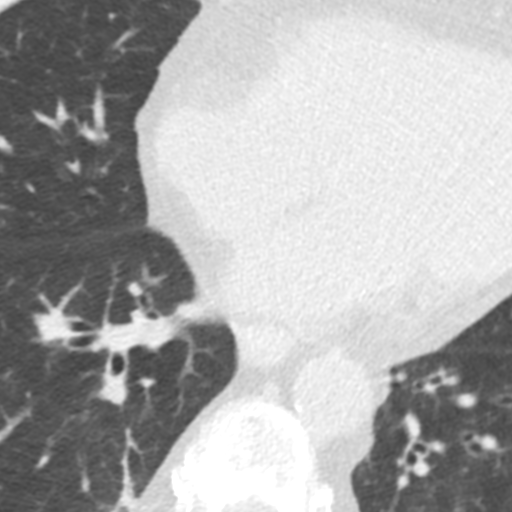
[im 85/141  lung]
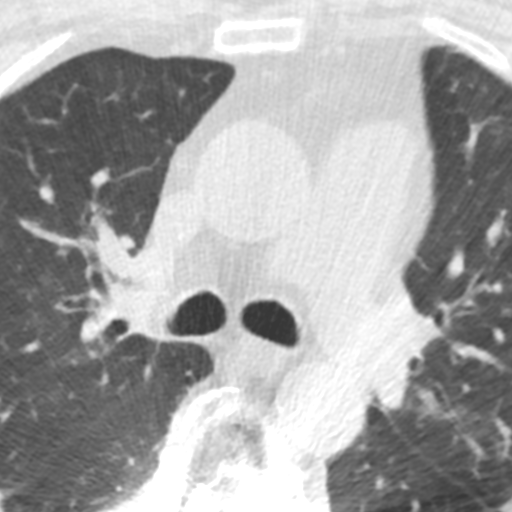
[im 113/141  lung]
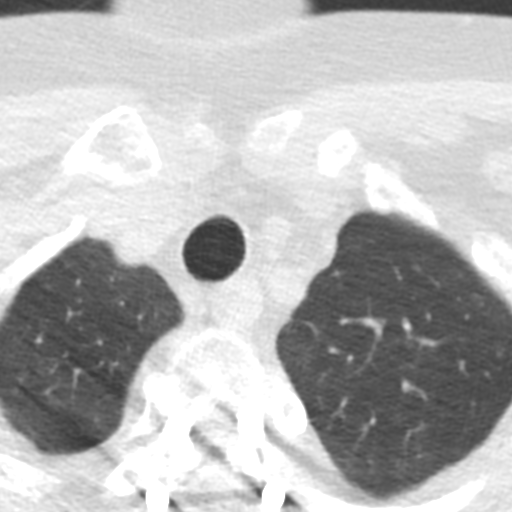

[Series 8: axial st · axial · 0.32mm/px · z∈[-227,-59]mm · 4 of 141 slices shown (3 of 4)]
[im 29/141  lung]
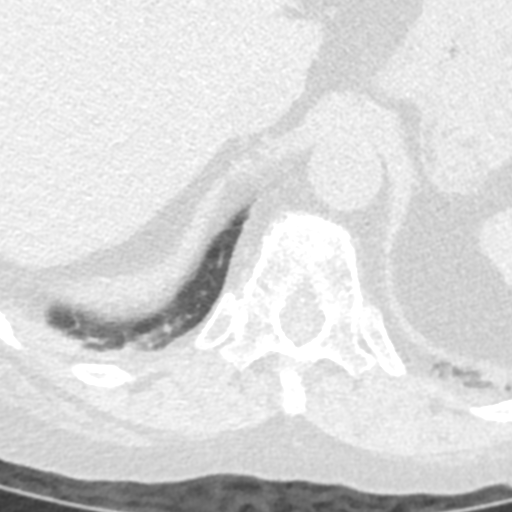
[im 57/141  lung]
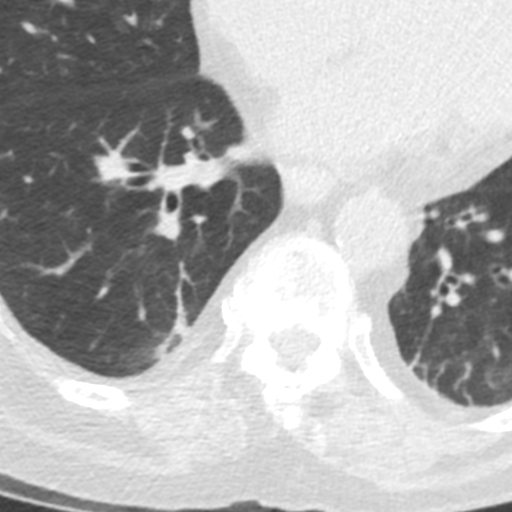
[im 85/141  lung]
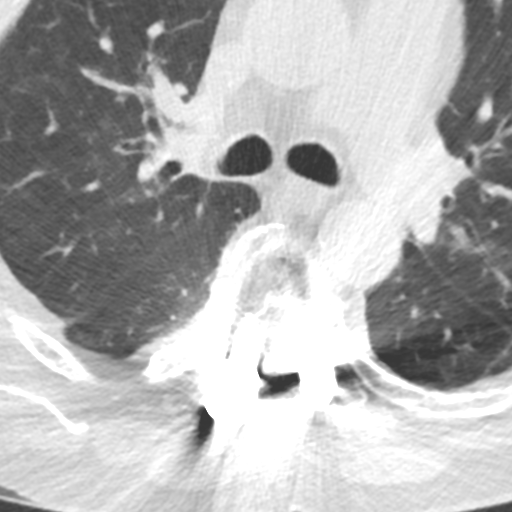
[im 113/141  lung]
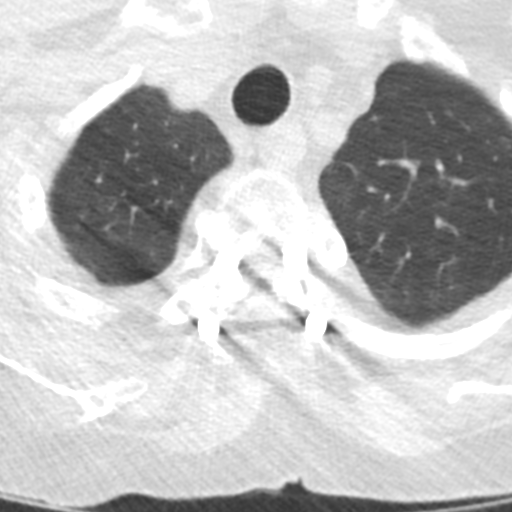

[Series 9: axial st · axial · 0.35mm/px · z∈[-227,-171]mm · 2 of 141 slices shown (4 of 4)]
[im 29/141  lung]
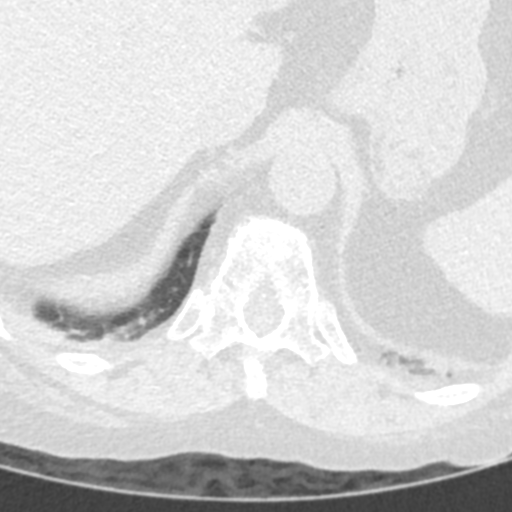
[im 57/141  lung]
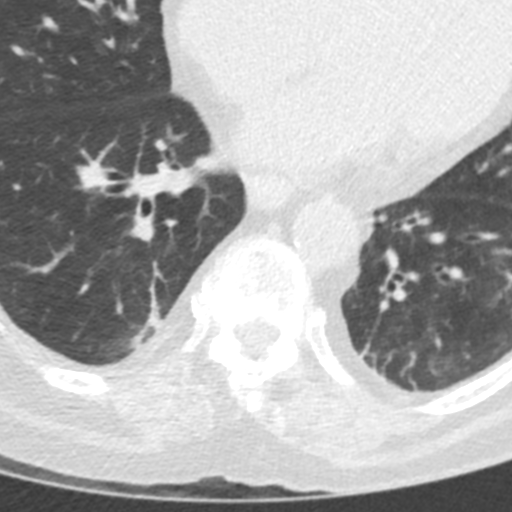

[15 of 36 positions shown; findings below may reference images not displayed]

FINDINGS: CT CHEST FINDINGS

Mediastinum/Lymph Nodes: Images of the thoracic inlet are
unremarkable. Central airways are patent. Atherosclerotic
calcifications of thoracic aorta and coronary arteries. No aortic
aneurysm. No mediastinal hematoma or adenopathy. Heart size within
normal limits. No pericardial effusion.

Lungs/Pleura: Images of the lung parenchyma shows no acute
infiltrate or pulmonary edema. Stable scarring in right lower lobe
posterior medially prior spinal. Stable bilateral lower lobe
anterolateral peripheral scarring. There is no lung contusion or
pneumothorax. No segmental consolidation.

Musculoskeletal: Stable old fracture deformity of the right third
and fourth rib. Again noted postsurgical changes with T4 and T5
corpectomy and solid fusion. The alignment is preserved. Stable
remote compression fracture of T11 vertebral body.

Sagittal images of the spine shows no acute fracture is. Sagittal
view of the sternum is unremarkable. No scapular fracture is noted.
Stable deformity of the left fifth rib. Stable posterior deformity
of the left seventh rib. No acute rib fractures are noted.

CT ABDOMEN PELVIS FINDINGS

Hepatobiliary: Study is limited without IV contrast. The unenhanced
liver shows no biliary ductal dilatation. Status postcholecystectomy

Pancreas: Again noted chronic pancreatic calcifications consistent
with chronic calcific pancreatitis. No dilatation of main pancreatic
duct.

Spleen: Unenhanced spleen shows no evidence of laceration.

Adrenals/Urinary Tract: No adrenal gland mass is noted. Unenhanced
kidneys are symmetrical in size. No nephrolithiasis. No
hydronephrosis or hydroureter. The urinary bladder is unremarkable.

Stomach/Bowel: There is no small bowel obstruction. No thickened or
dilated small bowel loops. The terminal ileum is unremarkable.
Moderate stool noted within cecum and right colon. Moderate stool
and gas noted within transverse colon. There is no pericecal
inflammation. The patient is status post appendectomy. Scattered
diverticula are noted descending colon. No evidence of acute
diverticulitis. Scattered diverticula are noted within proximal
sigmoid colon. No evidence of acute colitis or diverticulitis.
Moderate stool noted within redundant sigmoid colon.

Vascular/Lymphatic: No retroperitoneal or mesenteric adenopathy.
Atherosclerotic calcifications of abdominal aorta and iliac
arteries. No aortic aneurysm.

Reproductive: The uterus and ovaries are normal for age.
Atherosclerotic adnexal vascular calcifications are noted.

Other: No ascites or free abdominal air. There is a small left
inguinal region hernia containing fat measures 2 cm without evidence
of acute complication.

Musculoskeletal: Sagittal and coronal images of the thoracic and
lumbar spine submitted. Again no evidence of acute fracture or
subluxation. Stable postsurgical changes upper thoracic spine.
Stable compression deformity T11 vertebral body. There is diffuse
osteopenia.

No pelvic fractures are noted. There are degenerative changes
bilateral SI joints. Mild degenerative changes pubic symphysis.

Coronal images shows no hip fracture. Bilateral hip symmetrical in
appearance.

Sagittal images of the lumbar spine shows about 3 mm anterolisthesis
L4 on L5 vertebral body. There is disc space flattening with vacuum
disc phenomenon at L4-L5 level. There is interval mild compression
deformity lower endplate of T12 vertebral body. This is of
indeterminate age. No evidence of bony protrusion in spinal canal at
this level. She
IMPRESSION: 1. There is no lung contusion or pneumothorax.
2. Stable postsurgical changes upper thoracic spine. Stable
compression deformity T11 vertebral body. There is interval mild
compression deformity lower endplate of T12 vertebral body of
indeterminate age. Clinical correlation is necessary. The spinal
canal is patent at this level. No evidence of bony protrusion.
3. No mediastinal hematoma or adenopathy.
4. No hydronephrosis or hydroureter.
5. No ascites or free abdominal air.
6. No evidence of urinary bladder injury.
7. Moderate stool noted in right colon and transverse colon. Colonic
diverticula are noted descending colon and sigmoid colon. No
evidence of acute diverticulitis. Moderate stool noted in redundant
sigmoid colon.
8. No pelvic fractures are noted.
9. Degenerative changes lumbar spine at L4-L5 level. Mild
anterolisthesis about 3 mm L4 on L5 vertebral body. Disc space
flattening with vacuum disc phenomenon at L4-L5 level.

## 2016-07-01 ENCOUNTER — Emergency Department (HOSPITAL_BASED_OUTPATIENT_CLINIC_OR_DEPARTMENT_OTHER): Payer: Medicaid Other

## 2016-07-01 ENCOUNTER — Emergency Department (HOSPITAL_BASED_OUTPATIENT_CLINIC_OR_DEPARTMENT_OTHER)
Admission: EM | Admit: 2016-07-01 | Discharge: 2016-07-01 | Disposition: A | Discharge status: Home / Self Care | Payer: Medicaid Other | Attending: Emergency Medicine | Admitting: Emergency Medicine

## 2016-07-01 ENCOUNTER — Telehealth (HOSPITAL_COMMUNITY): Payer: Self-pay

## 2016-07-01 ENCOUNTER — Encounter (HOSPITAL_BASED_OUTPATIENT_CLINIC_OR_DEPARTMENT_OTHER): Payer: Self-pay | Admitting: *Deleted

## 2016-07-01 DIAGNOSIS — I10 Essential (primary) hypertension: Secondary | ICD-10-CM | POA: Diagnosis not present

## 2016-07-01 DIAGNOSIS — S32000A Wedge compression fracture of unspecified lumbar vertebra, initial encounter for closed fracture: Secondary | ICD-10-CM

## 2016-07-01 DIAGNOSIS — J45909 Unspecified asthma, uncomplicated: Secondary | ICD-10-CM | POA: Insufficient documentation

## 2016-07-01 DIAGNOSIS — S63592A Other specified sprain of left wrist, initial encounter: Secondary | ICD-10-CM | POA: Insufficient documentation

## 2016-07-01 DIAGNOSIS — Z7984 Long term (current) use of oral hypoglycemic drugs: Secondary | ICD-10-CM | POA: Diagnosis not present

## 2016-07-01 DIAGNOSIS — R109 Unspecified abdominal pain: Secondary | ICD-10-CM | POA: Diagnosis not present

## 2016-07-01 DIAGNOSIS — Z79899 Other long term (current) drug therapy: Secondary | ICD-10-CM | POA: Insufficient documentation

## 2016-07-01 DIAGNOSIS — S63509A Unspecified sprain of unspecified wrist, initial encounter: Secondary | ICD-10-CM | POA: Diagnosis present

## 2016-07-01 DIAGNOSIS — E119 Type 2 diabetes mellitus without complications: Secondary | ICD-10-CM | POA: Insufficient documentation

## 2016-07-01 DIAGNOSIS — Y999 Unspecified external cause status: Secondary | ICD-10-CM | POA: Diagnosis not present

## 2016-07-01 DIAGNOSIS — Z87891 Personal history of nicotine dependence: Secondary | ICD-10-CM | POA: Insufficient documentation

## 2016-07-01 DIAGNOSIS — M545 Low back pain, unspecified: Secondary | ICD-10-CM

## 2016-07-01 DIAGNOSIS — Y9301 Activity, walking, marching and hiking: Secondary | ICD-10-CM | POA: Insufficient documentation

## 2016-07-01 DIAGNOSIS — Z7982 Long term (current) use of aspirin: Secondary | ICD-10-CM | POA: Diagnosis not present

## 2016-07-01 DIAGNOSIS — Y929 Unspecified place or not applicable: Secondary | ICD-10-CM | POA: Diagnosis not present

## 2016-07-01 DIAGNOSIS — S6992XA Unspecified injury of left wrist, hand and finger(s), initial encounter: Secondary | ICD-10-CM | POA: Diagnosis present

## 2016-07-01 DIAGNOSIS — W19XXXA Unspecified fall, initial encounter: Secondary | ICD-10-CM

## 2016-07-01 DIAGNOSIS — S63502A Unspecified sprain of left wrist, initial encounter: Secondary | ICD-10-CM

## 2016-07-01 DIAGNOSIS — W010XXA Fall on same level from slipping, tripping and stumbling without subsequent striking against object, initial encounter: Secondary | ICD-10-CM | POA: Insufficient documentation

## 2016-07-01 LAB — COMPREHENSIVE METABOLIC PANEL
ALBUMIN: 3.1 g/dL — AB (ref 3.5–5.0)
ALK PHOS: 152 U/L — AB (ref 38–126)
ALT: 34 U/L (ref 14–54)
ANION GAP: 8 (ref 5–15)
AST: 40 U/L (ref 15–41)
BUN: 9 mg/dL (ref 6–20)
CALCIUM: 8.5 mg/dL — AB (ref 8.9–10.3)
CO2: 24 mmol/L (ref 22–32)
CREATININE: 0.73 mg/dL (ref 0.44–1.00)
Chloride: 106 mmol/L (ref 101–111)
GFR calc Af Amer: >60 mL/min (ref >60)
GFR calc non Af Amer: >60 mL/min (ref >60)
GLUCOSE: 82 mg/dL (ref 65–99)
Potassium: 3.2 mmol/L — ABNORMAL LOW (ref 3.5–5.1)
SODIUM: 138 mmol/L (ref 135–145)
Total Bilirubin: 1 mg/dL (ref 0.3–1.2)
Total Protein: 7 g/dL (ref 6.5–8.1)

## 2016-07-01 MED ORDER — HYDROCODONE-ACETAMINOPHEN 5-325 MG PO TABS: 1.0000 | ORAL_TABLET | ORAL | 0 refills | Status: DC | PRN

## 2016-07-01 MED ORDER — MORPHINE SULFATE (PF) 4 MG/ML IV SOLN
4.0000 mg | INTRAVENOUS | Status: DC | PRN
Start: 2016-07-01 — End: 2016-07-01
  Administered 2016-07-01: 4 mg via INTRAVENOUS
  Filled 2016-07-01: qty 1

## 2016-07-01 MED ORDER — ONDANSETRON HCL 4 MG/2ML IJ SOLN
4.0000 mg | Freq: Once | INTRAMUSCULAR | Status: AC
  Administered 2016-07-01: 4 mg via INTRAVENOUS
  Filled 2016-07-01: qty 2

## 2016-07-01 NOTE — ED Provider Notes (Signed)
[facility] Provider Note   CSN: [id number] Arrival date & time: 07/01/16  1417  First Provider Contact:  First MD Initiated Contact with Patient 07/01/16 1620     By signing my name below, I, Vista Mink, attest that this documentation has been prepared under the direction and in the presence of Rolland Porter, MD. Electronically signed, Vista Mink, ED Scribe. 07/01/16. 5:14 PM.   History   Chief Complaint Chief Complaint  Patient presents with  . Fall    HPI HPI Comments: Amber [name] is a 60 y.o. Female, with a PMHx of back surgery with hardware, DM, chronic pain, abdominal pain, sepsis, who presents to the Emergency Department s/p a fall that occurred this morning. Pt states she fell onto her left hand from standing height; and reports pain to the dorsal aspect of her left hand and wrist. Pt further complains of left sided abdominal pain. Pt states she frequently fall due to weakness in her legs since surgery to her back; previous fall three days ago. Pt uses a walker but reports increased difficulty ambulating due to the weakness in her lower extremities. Pt further states that she has had dysuria since fall of 2016. She denies hematuria. Pt denies dizziness or lightheadedness.   The history is provided by the patient. No language interpreter was used.    Past Medical History:  Diagnosis Date  . Arthritis   . Asthma   . Chronic pain   . Constipation   . Diabetes mellitus without complication (HCC)   . Falls frequently   . GERD (gastroesophageal reflux disease)   . Hypertension   . Pneumonia 10/2015  . Seizures [facility])    last seizure March 2015    Patient Active Problem List   Diagnosis Date Noted  . Wrist sprain 07/01/2016  . Intractable abdominal pain 05/25/2016  . Sepsis (HCC) 02/29/2016  . Fever 02/28/2016  . Elevated troponin 12/24/2015  . AKI (acute kidney injury) (HCC) 12/24/2015  . Duodenitis   . Fracture of left great toe   . Pulmonary  infiltrates   . Chronic pain 11/07/2015  . Nausea with vomiting 11/07/2015  . Abdominal pain 11/07/2015  . Constipation 11/07/2015  . Abdominal distension   . Hypomagnesemia 08/25/2015  . Chest pain, musculoskeletal 08/24/2015  . CAP (community acquired pneumonia)   . Diabetes mellitus type 2, controlled (HCC) 07/27/2015  . Chest pain 07/27/2015  . HCAP (healthcare-associated pneumonia) 07/27/2015  . Chronic back pain   . Frequent falls   . Essential hypertension   . Pulmonary hypertension (HCC)   . Type 2 diabetes mellitus with complication (HCC)   . Seizures (HCC)   . Gout   . Asthma 06/12/2015  . Acute respiratory failure with hypoxia (HCC) 06/12/2015  . ACS (acute coronary syndrome) (HCC) 06/12/2015  . Asthma exacerbation 06/12/2015  . Acute respiratory failure (HCC) 06/12/2015  . Pain in the chest   . DM type 2 causing complication (HCC) 01/27/2015  . Microcytic anemia 01/26/2015  . Hypokalemia 01/26/2015  . Hypertension 01/26/2015  . History of seizures 01/26/2015  . Hypotension 01/25/2015    Past Surgical History:  Procedure Laterality Date  . APPENDECTOMY    . BACK SURGERY    . CHOLECYSTECTOMY      OB History    No data available       Home Medications    Prior to Admission medications   Medication Sig Start Date End Date Taking? Authorizing Provider  albuterol (PROVENTIL HFA;VENTOLIN HFA) 108 (  90 BASE) MCG/ACT inhaler Inhale 2 puffs into the lungs every 6 (six) hours as needed for wheezing or shortness of breath.    Historical Provider, MD  amLODipine (NORVASC) 5 MG tablet Take 5 mg by mouth daily.    Historical Provider, MD  aspirin EC 81 MG tablet Take 1 tablet (81 mg total) by mouth daily. 06/14/15   Richarda Overlie, MD  atorvastatin (LIPITOR) 10 MG tablet Take 10 mg by mouth daily. 08/13/15   Historical Provider, MD  diazepam (VALIUM) 5 MG tablet Take 1 tablet (5 mg total) by mouth 2 (two) times daily. 05/04/16   Elson Areas, PA-C  feeding supplement,  ENSURE ENLIVE, (ENSURE ENLIVE) LIQD Take 237 mLs by mouth 2 (two) times daily between meals. 11/10/15   Catarina Hartshorn, MD  Fluticasone-Salmeterol (ADVAIR) 250-50 MCG/DOSE AEPB Inhale 1 puff into the lungs 2 (two) times daily.    Historical Provider, MD  folic acid (FOLVITE) 1 MG tablet Take 1 mg by mouth daily.    Historical Provider, MD  furosemide (LASIX) 40 MG tablet Take 1 tablet (40 mg total) by mouth daily. 05/28/16   Penny Pia, MD  gabapentin (NEURONTIN) 300 MG capsule Take 1 capsule (300 mg total) by mouth 2 (two) times daily. 06/14/15   Richarda Overlie, MD  HYDROcodone-acetaminophen (NORCO/VICODIN) 5-325 MG tablet Take 1 tablet by mouth every 4 (four) hours as needed. 07/01/16   Rolland Porter, MD  LINZESS 145 MCG CAPS capsule Take 1 capsule (145 mcg total) by mouth daily as needed (constipation). 05/26/16   Penny Pia, MD  metFORMIN (GLUCOPHAGE-XR) 500 MG 24 hr tablet Take 500 mg by mouth daily with breakfast.    Historical Provider, MD  metoprolol succinate (TOPROL-XL) 25 MG 24 hr tablet Take 25 mg by mouth daily.    Historical Provider, MD  oxymorphone 20 MG T12A Take 20 mg by mouth every 12 (twelve) hours. 03/01/16   Richarda Overlie, MD  pantoprazole (PROTONIX) 40 MG tablet Take 1 tablet (40 mg total) by mouth 2 (two) times daily before a meal. 12/25/15   Ripudeep K Rai, MD  polyethylene glycol (MIRALAX / GLYCOLAX) packet Take 17 g by mouth 2 (two) times daily. 05/26/16   Penny Pia, MD  potassium chloride SA (K-DUR,KLOR-CON) 20 MEQ tablet Take 2 tablets (40 mEq total) by mouth daily. 01/27/15   Calvert Cantor, MD  predniSONE (DELTASONE) 10 MG tablet Take 15 mg by mouth daily with breakfast.    Historical Provider, MD  promethazine (PHENERGAN) 25 MG tablet Take 1 tablet by mouth every 6 (six) hours as needed for nausea or vomiting. nausea 10/06/15   Historical Provider, MD  Vitamin D, Ergocalciferol, (DRISDOL) 50000 UNITS CAPS capsule Take 1 capsule (50,000 Units total) by mouth every 7 (seven) days.  Sunday Patient taking differently: Take 50,000 Units by mouth every 7 (seven) days.  01/27/15   Calvert Cantor, MD  VITAMIN E PO Take 1 capsule by mouth daily.    Historical Provider, MD    Family History Family History  Problem Relation Age of Onset  . Kidney failure Mother   . Hypertension Sister   . Hypertension Brother     Social History Social History  Substance Use Topics  . Smoking status: Former Games developer  . Smokeless tobacco: Never Used  . Alcohol use No     Comment: admits to 2 drinks/week     Allergies   Ivp dye [iodinated diagnostic agents]   Review of Systems Review of Systems  Constitutional: Negative  for appetite change, chills, diaphoresis, fatigue and fever.  HENT: Negative for mouth sores, sore throat and trouble swallowing.   Eyes: Negative for visual disturbance.  Respiratory: Negative for cough, chest tightness, shortness of breath and wheezing.   Cardiovascular: Negative for chest pain.  Gastrointestinal: Positive for abdominal pain (left side). Negative for abdominal distention, diarrhea, nausea and vomiting.  Endocrine: Negative for polydipsia, polyphagia and polyuria.  Genitourinary: Negative for dysuria, frequency and hematuria.  Musculoskeletal: Positive for arthralgias (left hand and wrist). Negative for gait problem.  Skin: Negative for color change, pallor and rash.  Neurological: Negative for dizziness, syncope, light-headedness and headaches.  Hematological: Does not bruise/bleed easily.  Psychiatric/Behavioral: Negative for behavioral problems and confusion.  All other systems reviewed and are negative.    Physical Exam Updated Vital Signs BP 147/90 (BP Location: Left Arm)   Pulse 78   Temp 98.6 F (37 C) (Oral)   Resp 20   Ht 5' (1.524 m)   Wt 143 lb (64.9 kg)   SpO2 100%   BMI 27.93 kg/m   Physical Exam  Constitutional: She is oriented to person, place, and time. She appears well-developed and well-nourished. No distress.  HENT:   Head: Normocephalic.  Eyes: Conjunctivae are normal. Pupils are equal, round, and reactive to light. No scleral icterus.  Neck: Normal range of motion. Neck supple. No thyromegaly present.  Cardiovascular: Normal rate and regular rhythm.  Exam reveals no gallop and no friction rub.   No murmur heard. Pulmonary/Chest: Effort normal and breath sounds normal. No respiratory distress. She has no wheezes. She has no rales.  Abdominal: Soft. Bowel sounds are normal. She exhibits no distension. There is tenderness. There is no rebound.  Tenderness on left lower lateral ribs. Tenderness left upper quadrant.  Musculoskeletal: Normal range of motion.  Soft tissue swelling radial and ulnar of dorsal hand and wrist.   Neurological: She is alert and oriented to person, place, and time.  Skin: Skin is warm and dry. No rash noted.  Psychiatric: She has a normal mood and affect. Her behavior is normal.     ED Treatments / Results  DIAGNOSTIC STUDIES: Oxygen Saturation is 100% on RA, normal by my interpretation.  COORDINATION OF CARE: 4:32 PM-Will order CT. Discussed treatment plan with pt at bedside and pt agreed to plan.   Labs (all labs ordered are listed, but only abnormal results are displayed) Labs Reviewed  COMPREHENSIVE METABOLIC PANEL - Abnormal; Notable for the following:       Result Value   Potassium 3.2 (*)    Calcium 8.5 (*)    Albumin 3.1 (*)    Alkaline Phosphatase 152 (*)    All other components within normal limits    EKG  EKG Interpretation None       Radiology Ct Abdomen Pelvis Wo Contrast  Result Date: 07/01/2016 CLINICAL DATA:  Back pain post fall, painful urination ; history hypertension, type II diabetes mellitus, asthma, EXAM: CT ABDOMEN AND PELVIS WITHOUT CONTRAST TECHNIQUE: Multidetector CT imaging of the abdomen and pelvis was performed following the standard protocol without IV contrast. IV contrast not administered due to contrast allergy. Oral contrast was  not administered. COMPARISON:  05/25/2016 FINDINGS: Lower chest:  Minimal atelectasis LEFT base. Hepatobiliary: Gallbladder surgically absent. No focal hepatic abnormalities. Pancreas: Scattered pancreatic calcifications. No ductal dilatation or mass. Spleen: Normal appearance Adrenals/Urinary Tract: Adrenal glands normal appearance. Kidneys, ureters, and bladder normal appearance. No urinary tract calcification or dilatation. Stomach/Bowel: Appendix not visualized but  no pericecal inflammatory process is seen. Scattered colonic diverticula. Colon under distended limiting assessment of wall thickness, cannot exclude colonic wall thickening/colitis of the transverse through proximal sigmoid colon. Vascular/Lymphatic: Extensive atherosclerotic calcifications aorta and throughout abdomen and pelvis. Aorta normal caliber. No adenopathy. Reproductive: Normal appearing uterus and adnexa. Other: No free air, free fluid, or abnormal fluid collection. Question LEFT inguinal hernia containing fat. Musculoskeletal: Diffuse osseous demineralization. Compression fractures of T12 and L1, with inferior endplate L1 fracture progressive since prior study. IMPRESSION: Question colonic wall thickening/ colitis above the transverse through proximal sigmoid colon, though colon is unopacified and underdistended, limiting assessment. Scattered colonic diverticulosis. Probable LEFT inguinal hernia containing fat. Increased compression fracture inferior endplate L1. Aortic atherosclerosis. Electronically Signed   By: Ulyses Southward M.D.   On: 07/01/2016 17:52   Dg Wrist Complete Left  Result Date: 07/01/2016 CLINICAL DATA:  Fall 3 days ago onto outstretched hand. Left wrist injury and pain. Initial encounter. EXAM: LEFT WRIST - COMPLETE 3+ VIEW COMPARISON:  02/28/2016 FINDINGS: There is no evidence of fracture or dislocation. Severe peripheral vascular calcification noted. Advanced osteoarthritis is seen involving the radiocarpal joint  with scapholunate widening, and prominent sclerosis of the proximal pole of scaphoid. This is consistent with scapholunate advanced collapse, and is unchanged in appearance since previous study. Advanced osteoarthritis is also seen involving the STT joint complex and base of thumb. IMPRESSION: No acute findings. Scapholunate advanced collapse and associated osteoarthritis, without significant change. Extensive peripheral vascular calcification . Electronically Signed   By: Myles Rosenthal M.D.   On: 07/01/2016 17:42   Ct Cervical Spine Wo Contrast  Result Date: 07/01/2016 CLINICAL DATA:  Multiple recent falls with neck pain, initial encounter EXAM: CT CERVICAL SPINE WITHOUT CONTRAST TECHNIQUE: Multidetector CT imaging of the cervical spine was performed without intravenous contrast. Multiplanar CT image reconstructions were also generated. COMPARISON:  05/25/2015 . FINDINGS: Seven cervical segments are well visualized. Vertebral body height is well maintained. No acute fracture or acute facet abnormality is noted. Multilevel facet hypertrophic changes are seen. Mild stable anterolisthesis of C3 on C4 is noted. Disc space narrowing is noted at C5-6 and C6-7 with associated osteophytes. Postsurgical changes are noted at T1 and extending inferiorly through the visualized thoracic spine. No acute soft tissue abnormality is seen. The lungs are well aerated bilaterally. Old rib fractures are noted on the right. IMPRESSION: Postsurgical changes in the upper thoracic spine. Multilevel degenerative changes in the cervical spine stable from the prior exam. Electronically Signed   By: Alcide Clever M.D.   On: 07/01/2016 17:45   Dg Hand Complete Left  Result Date: 07/01/2016 CLINICAL DATA:  Fall 3 days ago onto outstretched hand. Left hand pain. Initial encounter. EXAM: LEFT HAND - COMPLETE 3+ VIEW COMPARISON:  02/28/2016 FINDINGS: There is no evidence of fracture or dislocation. Mild to moderate osteoarthritis is seen  involving the distal interphalangeal joints of all digits as well as the second and third MCP joints. Advanced osteoarthritis is also seen involving the base of the thumb. Peripheral vascular calcification noted. IMPRESSION: No acute findings. Osteoarthritis, as described above. Peripheral vascular calcification. Electronically Signed   By: Myles Rosenthal M.D.   On: 07/01/2016 17:32    Procedures Procedures  Medications Ordered in ED Medications  morphine 4 MG/ML injection 4 mg (4 mg Intravenous Given 07/01/16 1657)  ondansetron (ZOFRAN) injection 4 mg (4 mg Intravenous Given 07/01/16 1656)    Initial Impression / Assessment and Plan / ED Course  I have reviewed  the triage vital signs and the nursing notes.  Pertinent labs & imaging results that were available during my care of the patient were reviewed by me and considered in my medical decision making (see chart for details).  Clinical Course    CT suggests some colonic thickening. Patient has no symptoms of colitis. No diarrhea no nausea no fever. No solid organ injury per CT. Has progression of L1 compression. No sign of posterior retropulsion. Normal neurological exam. Placed in a left wrist splint. I discussed all the findings with her. She will avoid upright activity. Vicodin for pain control. Continue Colace at home for bowel protocol. Stay hydrated. Wrist splint pain-free. Primary care follow-up if not improving.  Final Clinical Impressions(s) / ED Diagnoses   Final diagnoses:  Wrist sprain, left, initial encounter  Midline low back pain without sciatica  Lumbar compression fracture, closed, initial encounter (HCC)    New Prescriptions New Prescriptions   HYDROCODONE-ACETAMINOPHEN (NORCO/VICODIN) 5-325 MG TABLET    Take 1 tablet by mouth every 4 (four) hours as needed.  I personally performed the services described in this documentation, which was scribed in my presence. The recorded information has been reviewed and is  accurate.    Rolland Porter, MD 07/01/16 [phone]

## 2016-07-01 NOTE — Telephone Encounter (Signed)
Pharmacy calling stating pt has filled a Rx for Opano 30 mg ER # 56 and Oxycodone 20 mg # 112 filled 06/14/2016, does MD want them to fill new Vicodin 5-325 mg # 20 Rx prescribed today.  Prescriber Dr Judie Petit. Adele Barthel and has asked pharmacy not to fill Rx for vicodin.  Pharmacist informed asked to destroy Rx.

## 2016-07-01 NOTE — ED Notes (Signed)
Pt at CT

## 2016-07-01 NOTE — Discharge Instructions (Signed)
Avoid upright activity until your back pain improves.  Follow-up with your primary care physician if her symptoms are not improving within the next 7 days.  Wear the wrist splinting your symptoms improved.

## 2016-07-01 NOTE — ED Triage Notes (Signed)
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 Has fallen several times with the latest being this a.m. While walking with walker to "empty pan." C/O left side pain. (Back, hand and neck)

## 2016-07-05 IMAGING — DX DG CHEST 2V
2 series · 2 of 2 positions shown · non-contrast
Comparison: Chest radiograph dated 03/01/2016 and CT dated
05/04/2016

CLINICAL DATA: 59-year-old female with chest pain for 2 weeks.
Patient had a bony spurring with worsening back pain right shoulder
pain.

EXAM:
CHEST  2 VIEW

[chest pa]
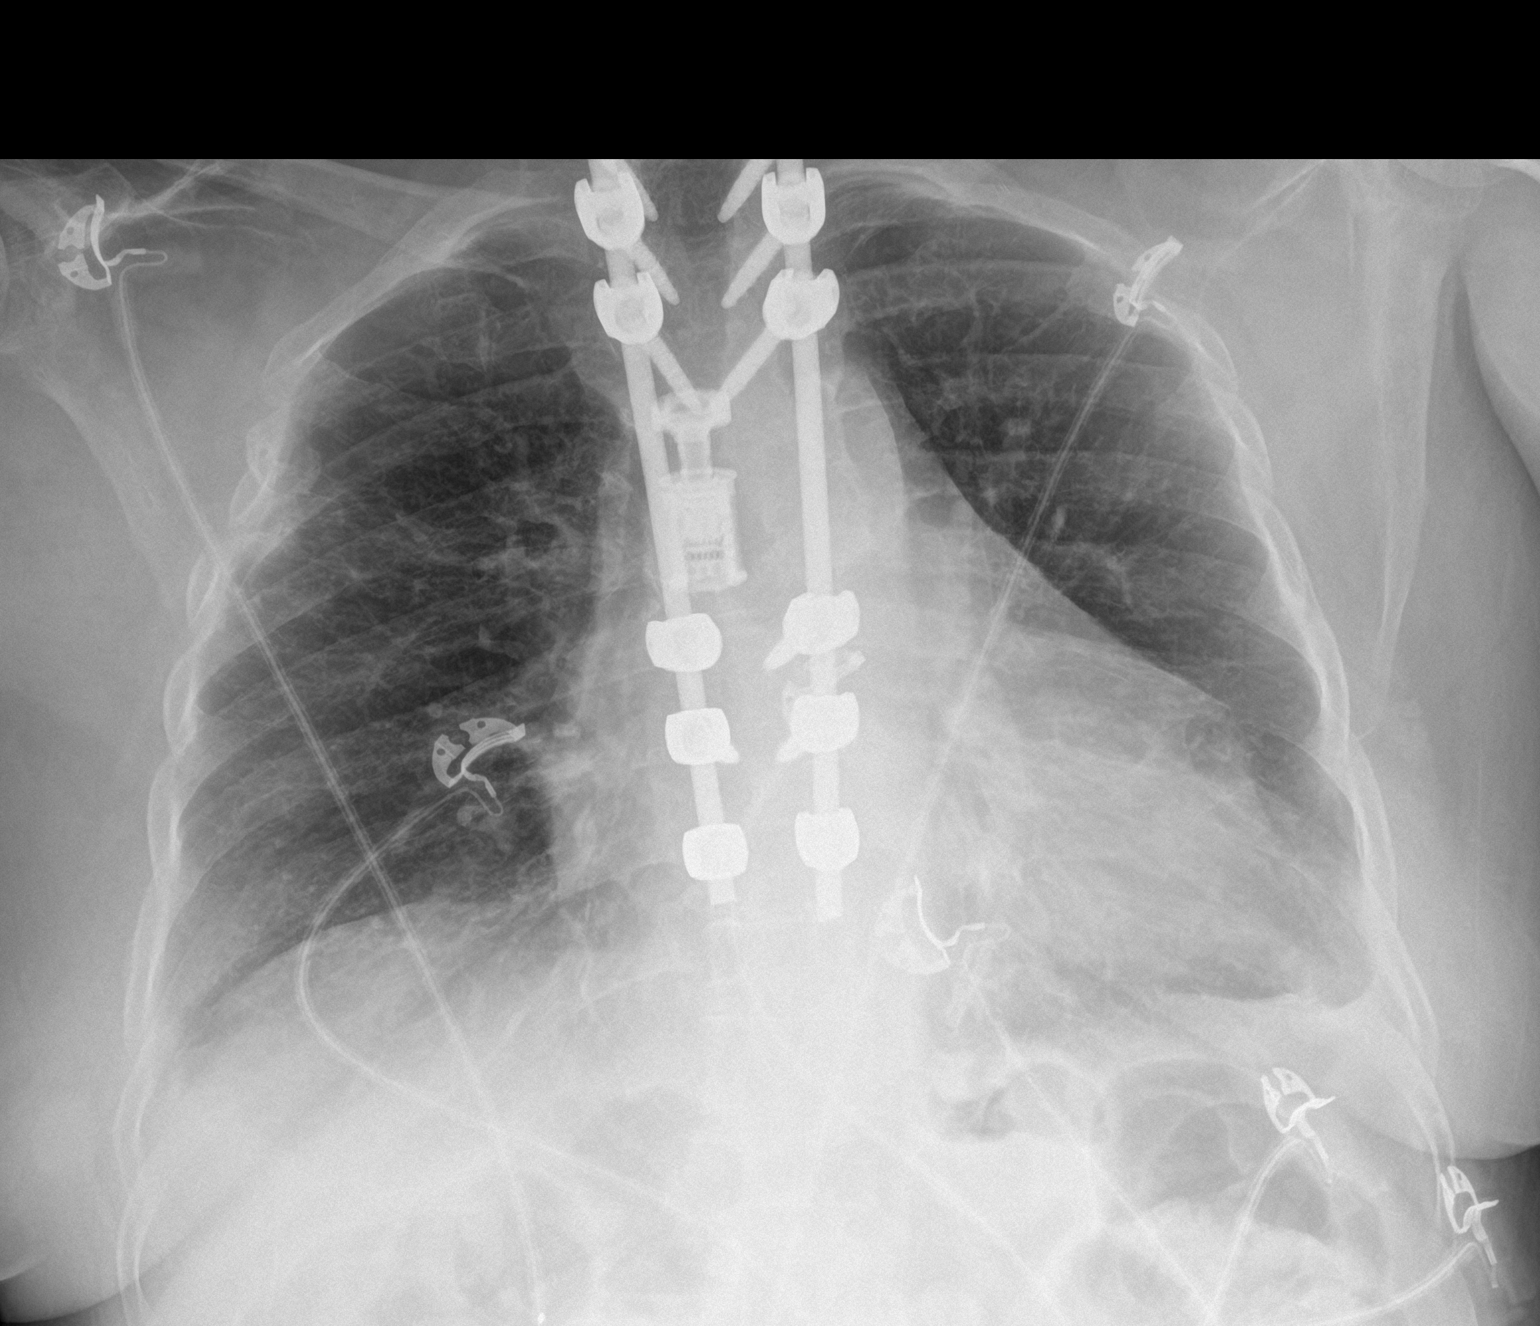

[chest lat]
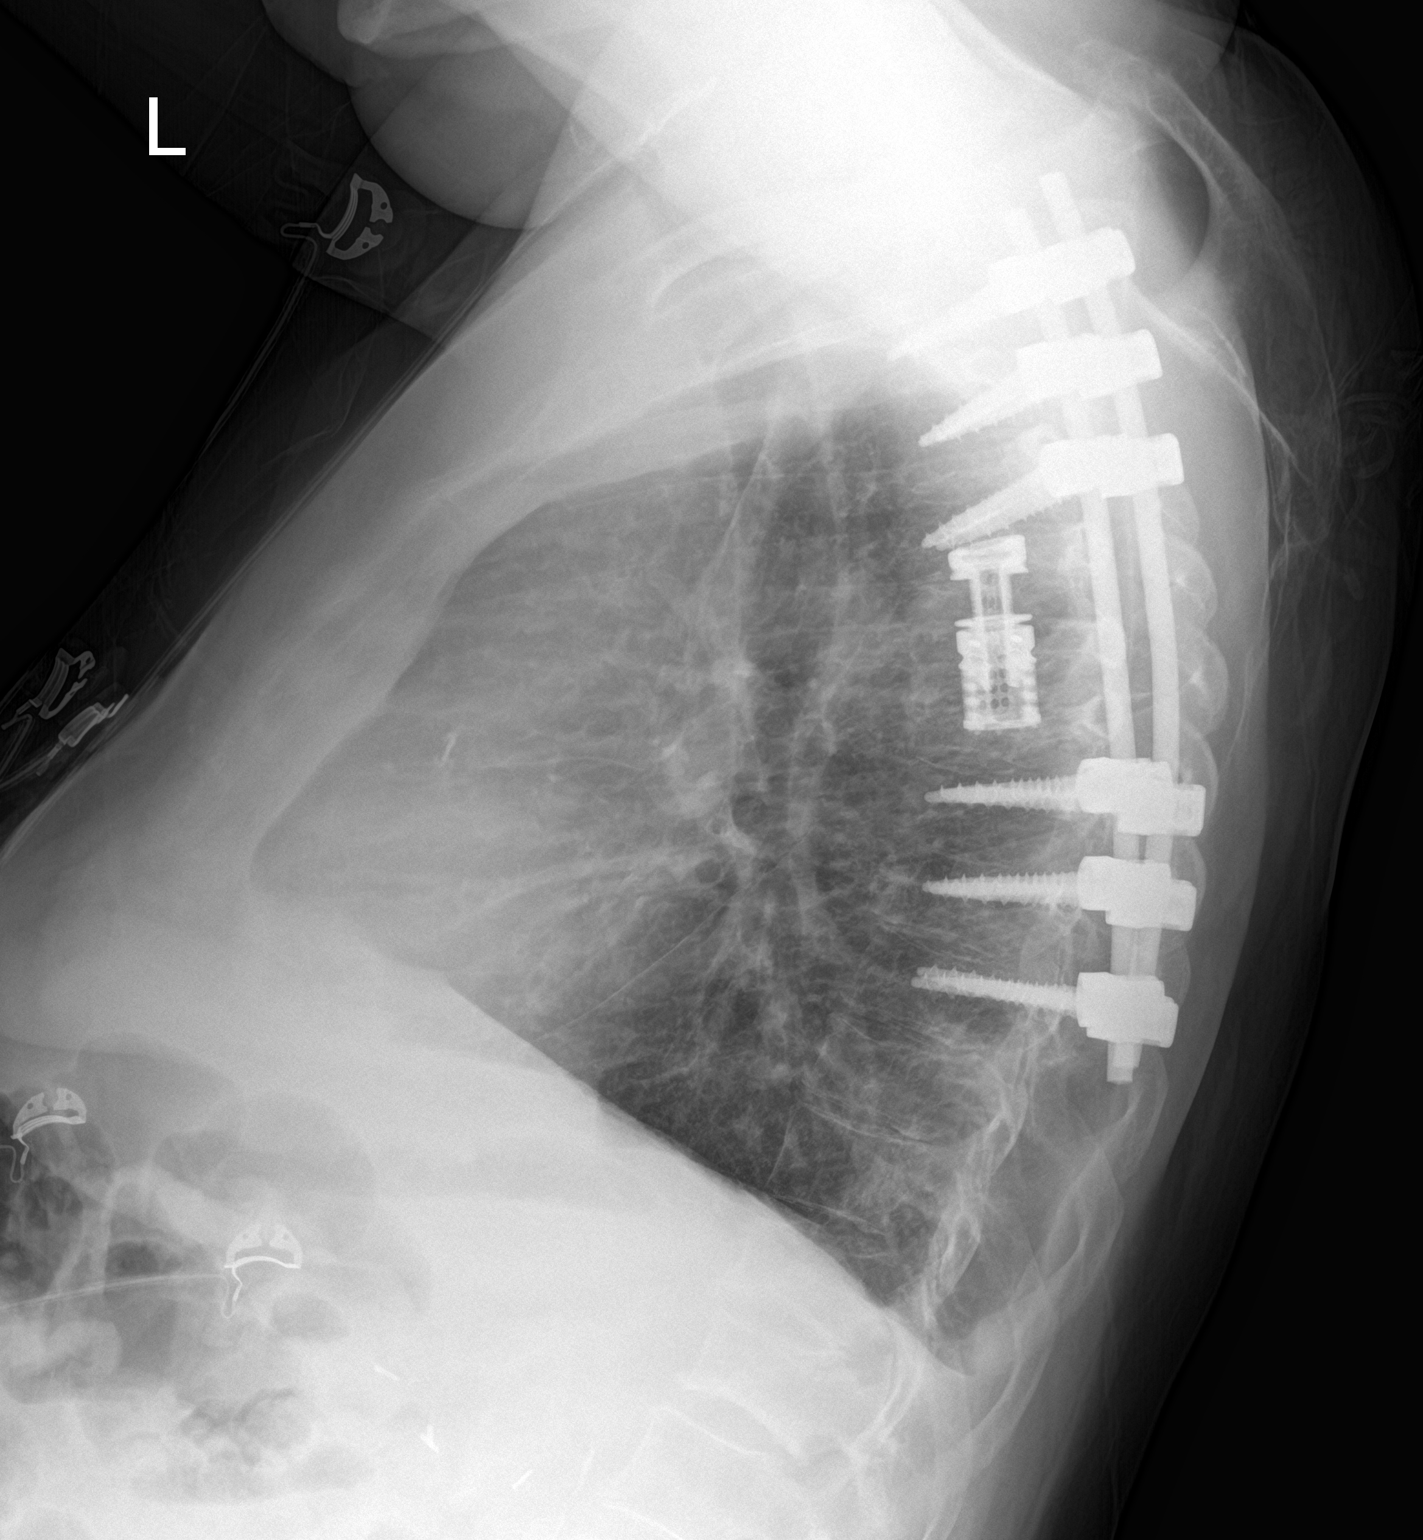

[2 of 2 positions shown; findings below may reference images not displayed]

FINDINGS: Two views of the chest demonstrate patchy area airspace density at
the left lung base which may represent atelectasis/scarring or
developing pneumonia. There is blunting of the left costophrenic
angle concerning for a small pleural effusion versus pleural
scarring. The right lung is clear. Top-normal cardiac silhouette.
There is osteopenia. Evaluation for fracture acute fracture is
limited due to advanced osteopenia. There multilevel age
indeterminate compression deformities of the thoracic spine. There
has been interval progression of the compression fractures of the
lower thoracic vertebrae. Posterior fusion metallic hardware as well
as upper thoracic corpectomy.
IMPRESSION: Left lung base atelectasis versus infiltrate.

Pleural scarring versus trace left pleural effusion.

Advanced osteopenia with multilevel age indeterminate compression
deformity of the thoracic spine. There has been interval progression
of the lower thoracic compression fractures compared to the prior
study. Clinical correlation is recommended. First

## 2016-07-05 IMAGING — DX DG SHOULDER 2+V*R*
2 series · 2 of 2 positions shown · non-contrast
Comparison: None.

CLINICAL DATA: Shoulder pain.  Fall yesterday

EXAM:
RIGHT SHOULDER - 2+ VIEW

[shoulder grashey]
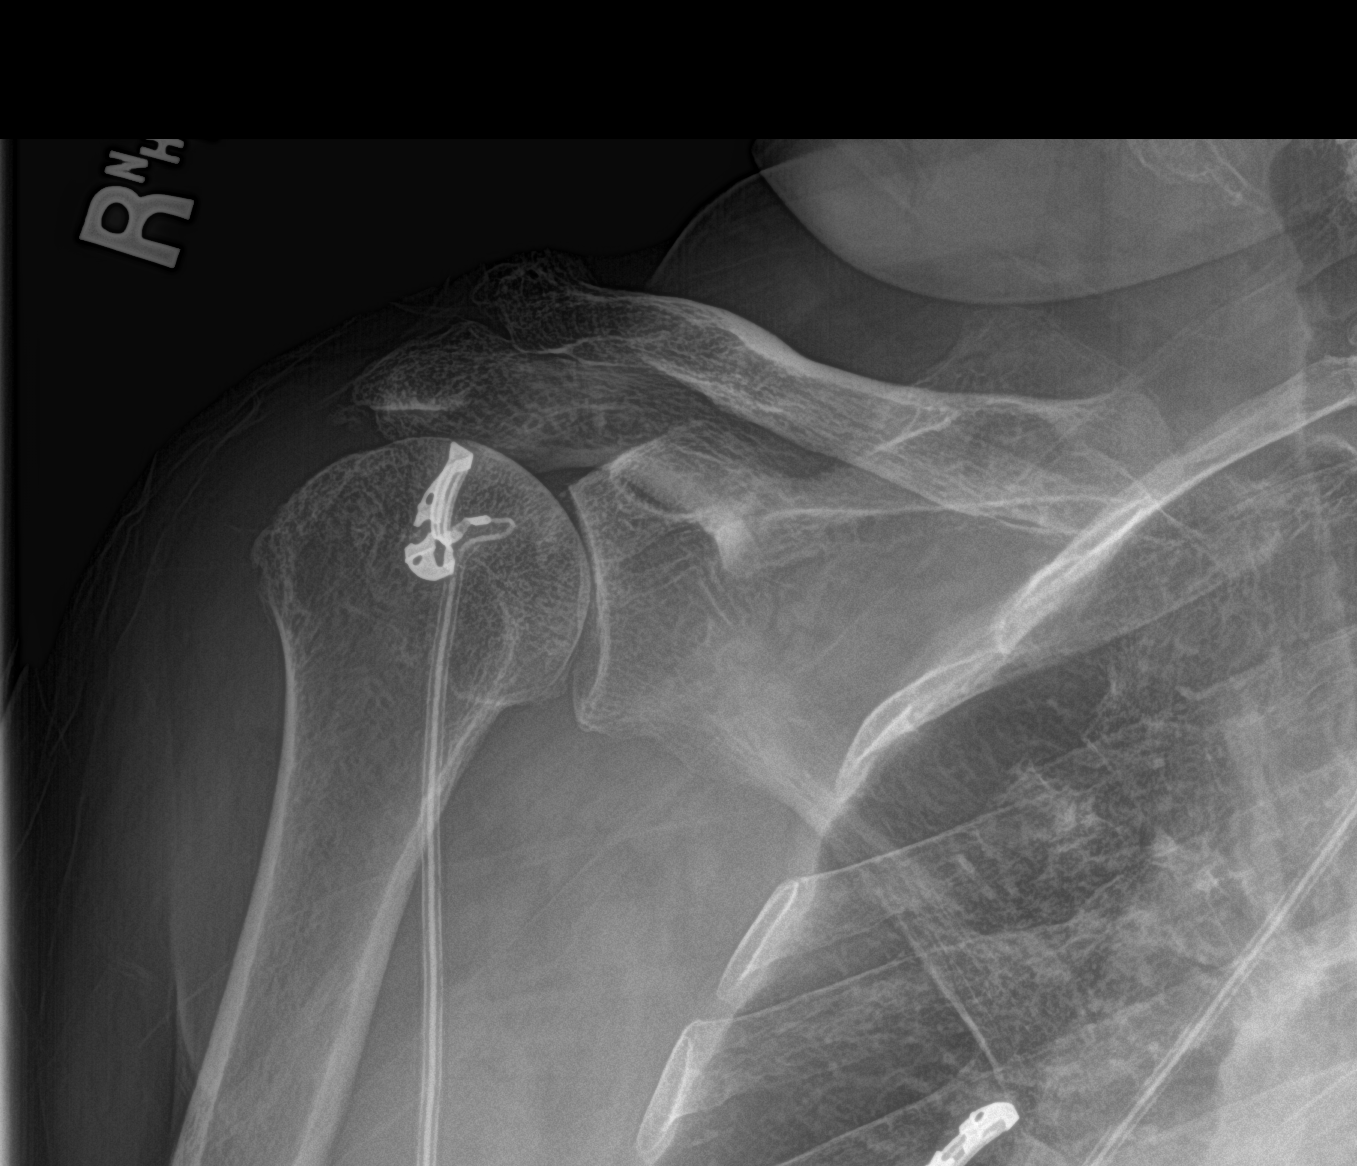

[shoulder y view]
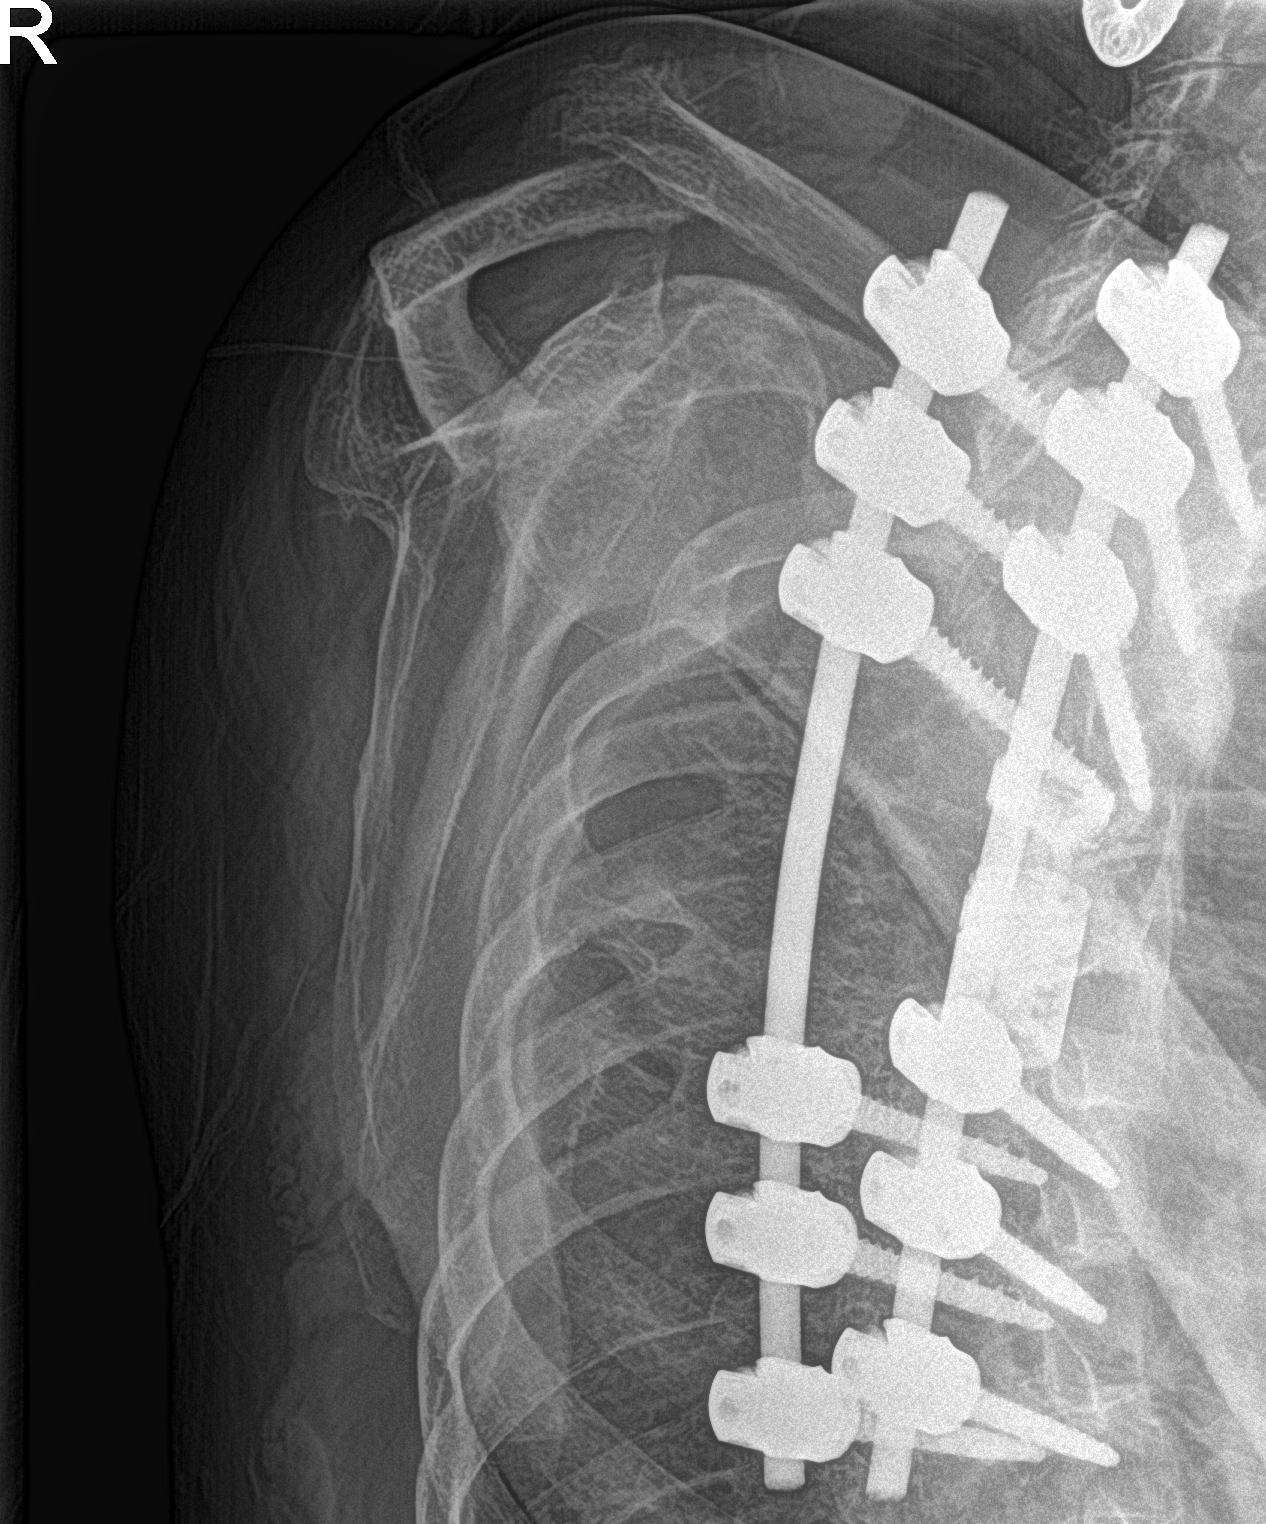

[2 of 2 positions shown; findings below may reference images not displayed]

FINDINGS: Mild degenerative change in the glenohumeral joint. Rotator cuff
impingement anatomy with narrowing of the acromial humeral distance.

Inferior displacement of the acromion relative to the clavicle may
represent grade 2 AC separation. This is not identified on the prior
chest x-ray. No prior imaging of the right shoulder. This could be
acute or chronic.

Negative for fracture.
IMPRESSION: Negative for fracture

AC separation

## 2016-07-05 IMAGING — DX DG THORACIC SPINE 2V
3 series · 3 of 3 positions shown · non-contrast
Comparison: Chest radiograph dated 03/01/2016 and CT dated
05/04/2016

CLINICAL DATA: 59-year-old female with chest pain for 2 weeks.
Patient had a bony spurring with worsening back pain right shoulder
pain.

EXAM:
CHEST  2 VIEW

[t-spine ap]
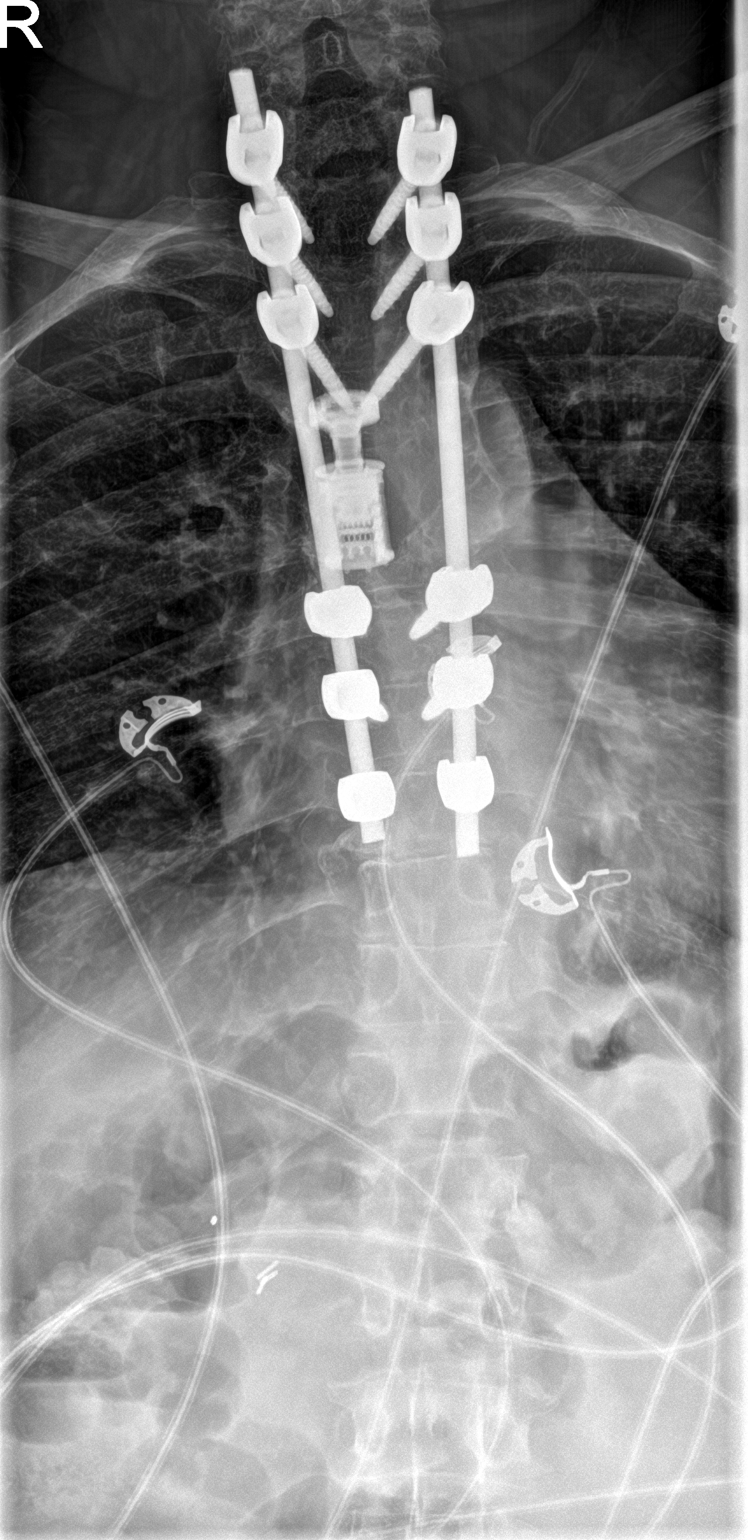

[t-spine lat]
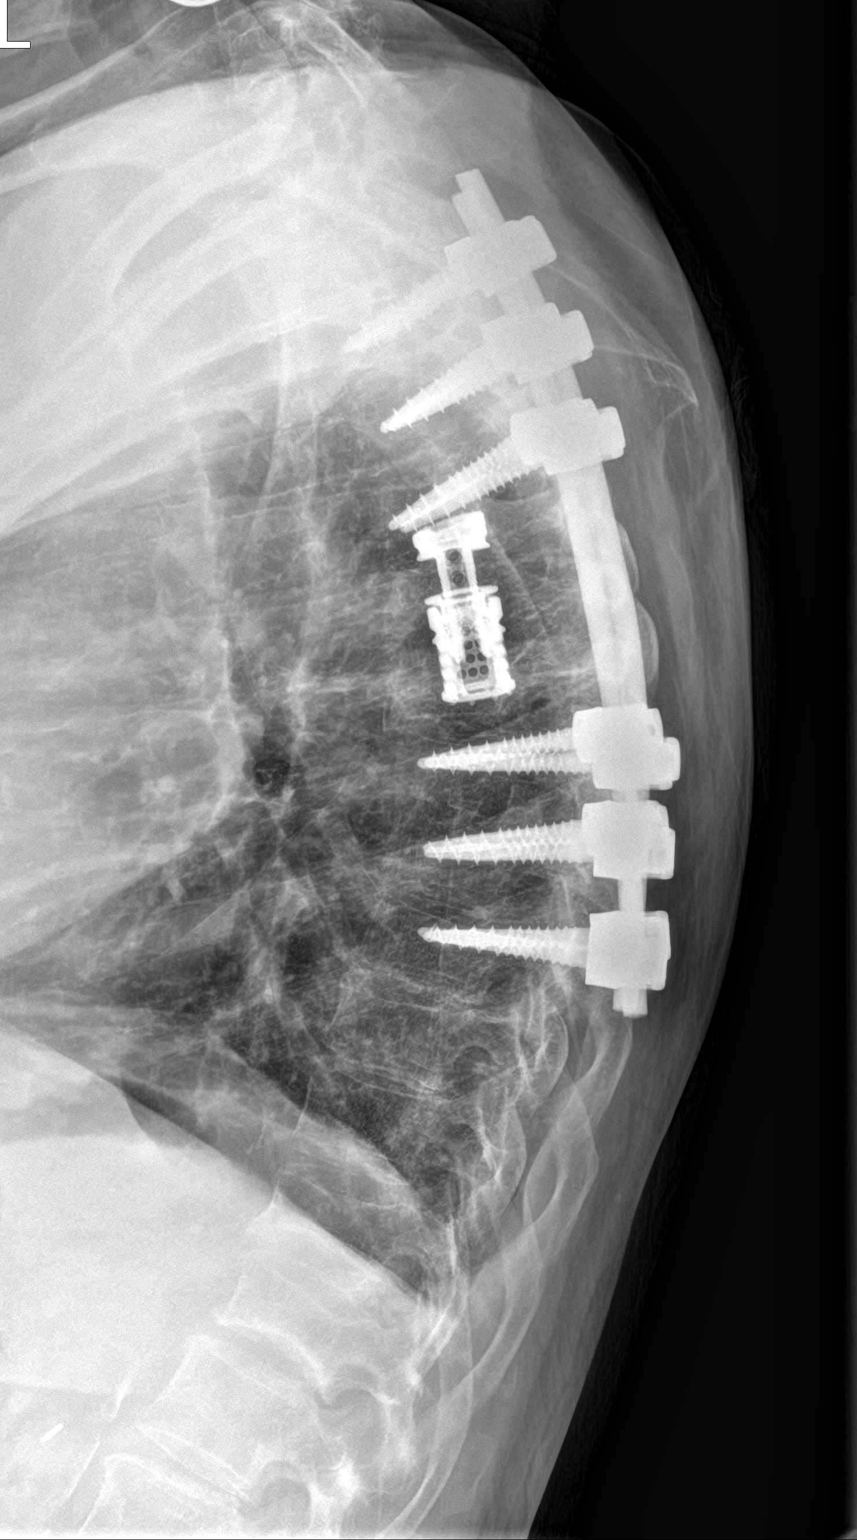

[t-spine swimmers]
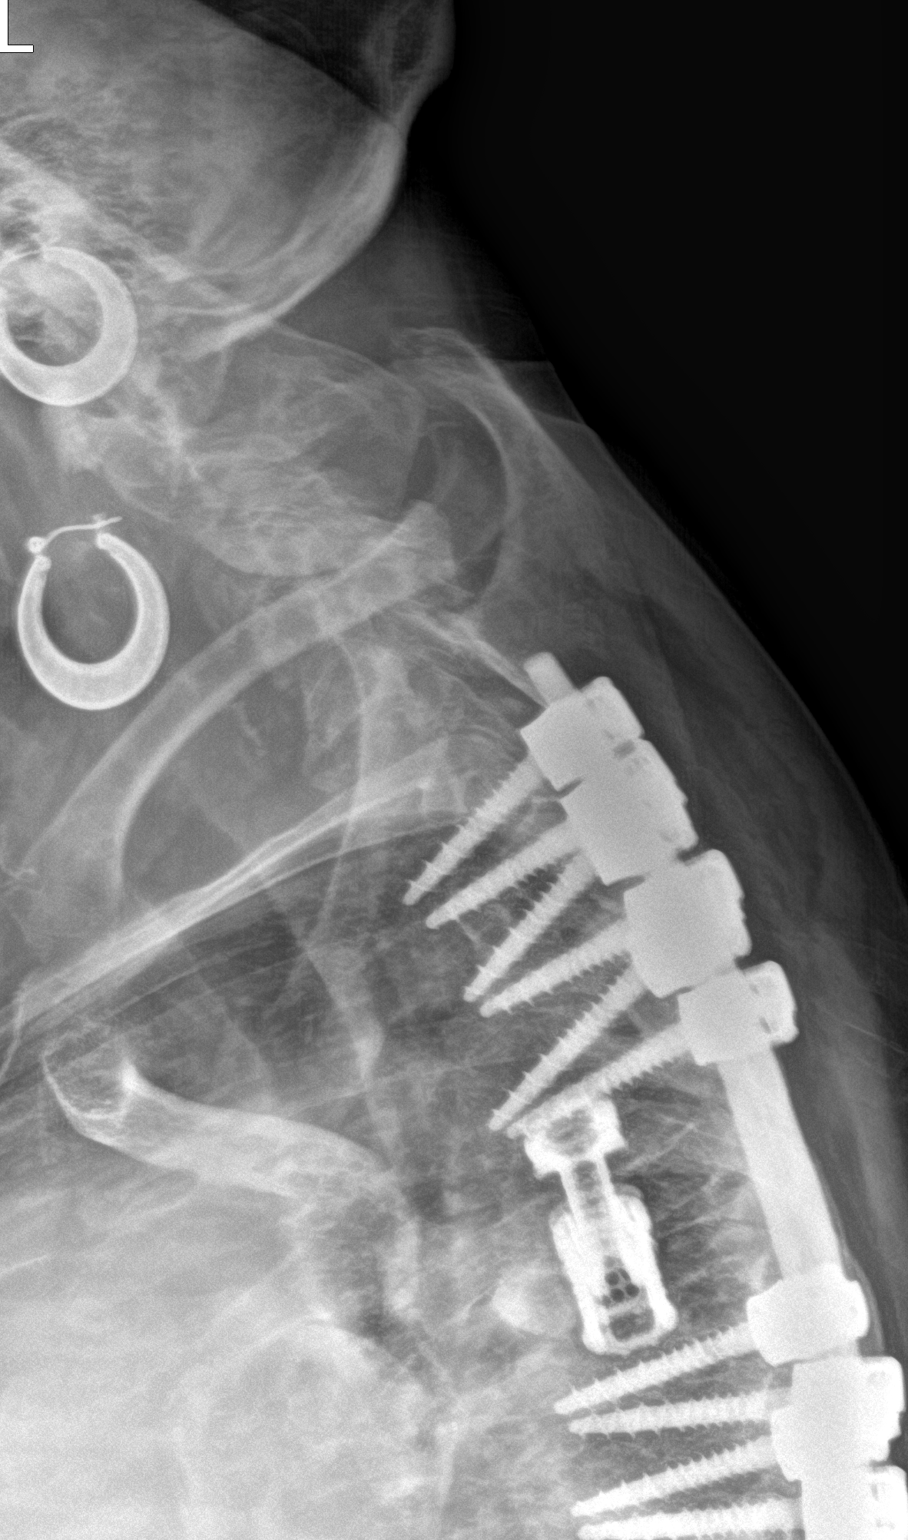

[3 of 3 positions shown; findings below may reference images not displayed]

FINDINGS: Two views of the chest demonstrate patchy area airspace density at
the left lung base which may represent atelectasis/scarring or
developing pneumonia. There is blunting of the left costophrenic
angle concerning for a small pleural effusion versus pleural
scarring. The right lung is clear. Top-normal cardiac silhouette.
There is osteopenia. Evaluation for fracture acute fracture is
limited due to advanced osteopenia. There multilevel age
indeterminate compression deformities of the thoracic spine. There
has been interval progression of the compression fractures of the
lower thoracic vertebrae. Posterior fusion metallic hardware as well
as upper thoracic corpectomy.
IMPRESSION: Left lung base atelectasis versus infiltrate.

Pleural scarring versus trace left pleural effusion.

Advanced osteopenia with multilevel age indeterminate compression
deformity of the thoracic spine. There has been interval progression
of the lower thoracic compression fractures compared to the prior
study. Clinical correlation is recommended. First

## 2016-07-11 IMAGING — CT CT ABD-PELV W/O CM
2 of 4 series · 16 of 46 positions shown, 18 images · non-contrast
Comparison: CT of the abdomen and pelvis from 05/04/2016

CLINICAL DATA: Acute onset of generalized abdominal pain, chills,
constipation, nausea and diaphoresis. Initial encounter.

EXAM:
CT ABDOMEN AND PELVIS WITHOUT CONTRAST
TECHNIQUE: Multidetector CT imaging of the abdomen and pelvis was performed
following the standard protocol without IV contrast.

[Series 2: abd/pel w/o · axial · non-contrast · 0.71mm/px · z∈[-162,+182]mm · 13 of 77 slices shown, 15 images]
[im 4/77  soft-tissue]
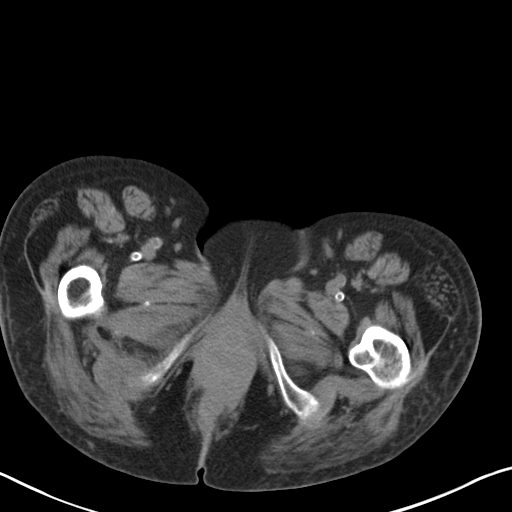
[im 4/77  bone]
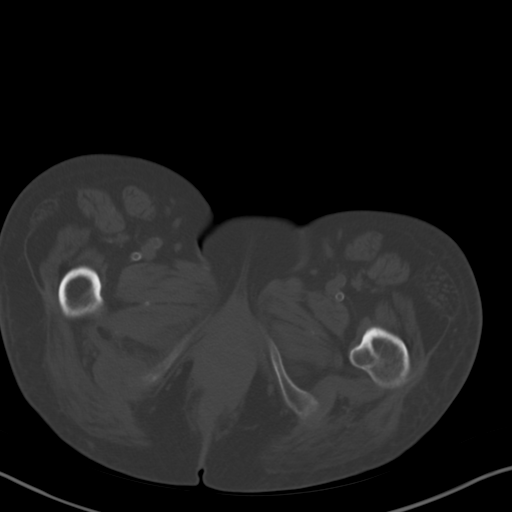
[im 11/77  soft-tissue]
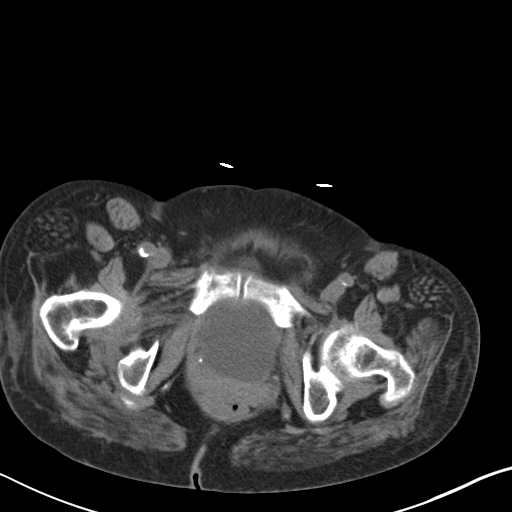
[im 18/77  soft-tissue]
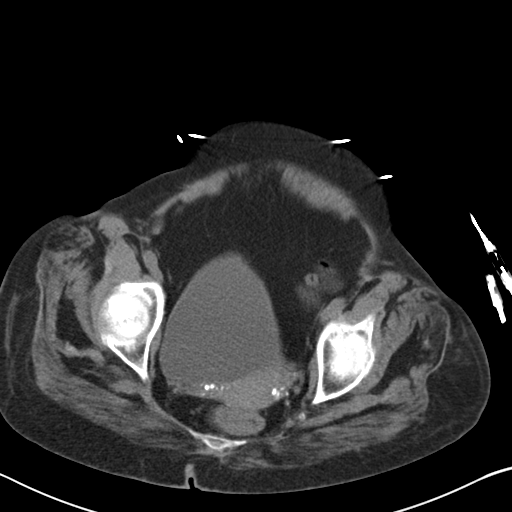
[im 21/77  soft-tissue]
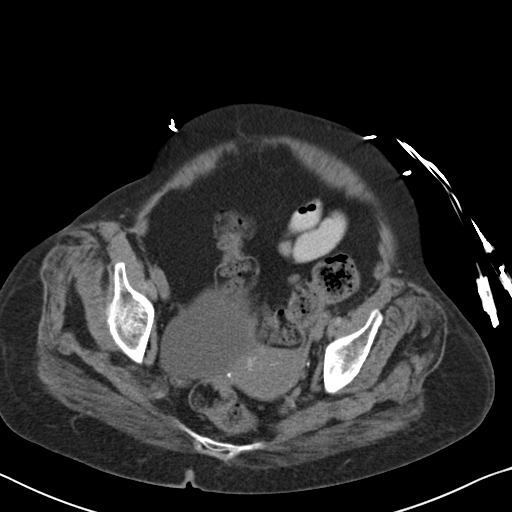
[im 28/77  soft-tissue]
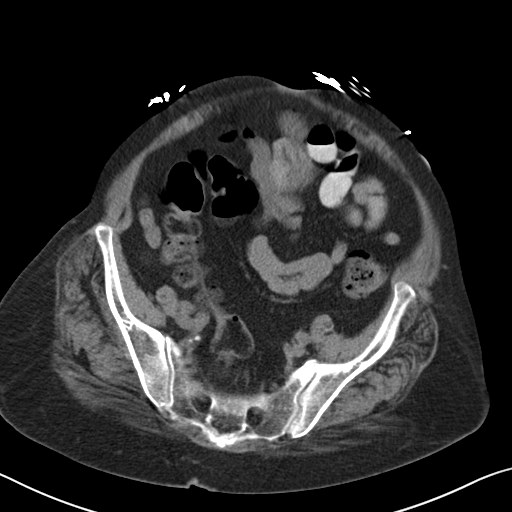
[im 32/77  soft-tissue]
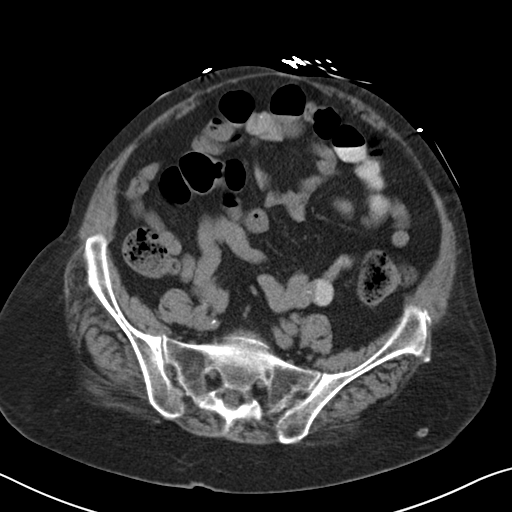
[im 39/77  soft-tissue]
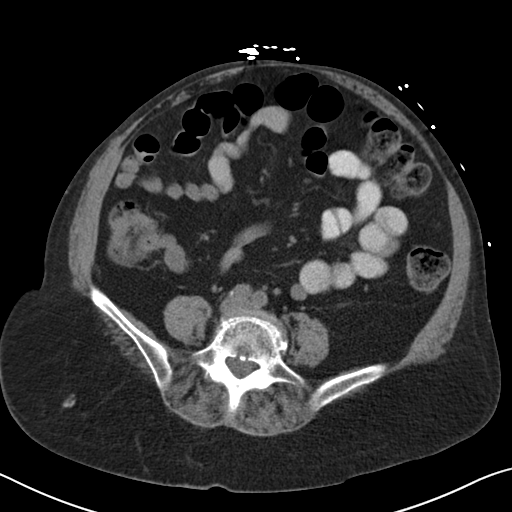
[im 45/77  soft-tissue]
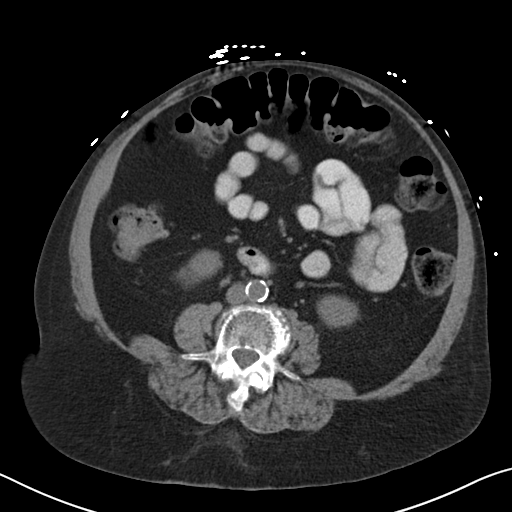
[im 49/77  soft-tissue]
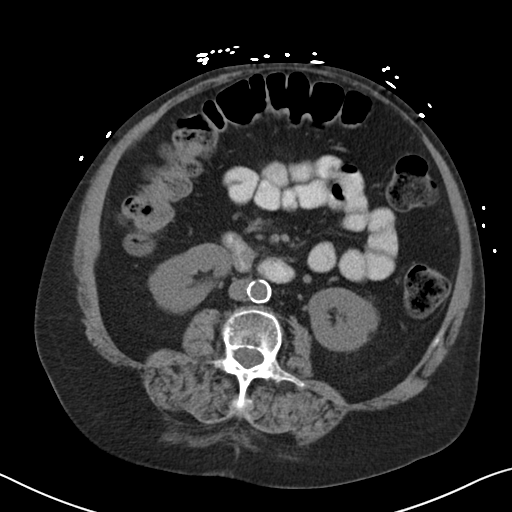
[im 49/77  bone]
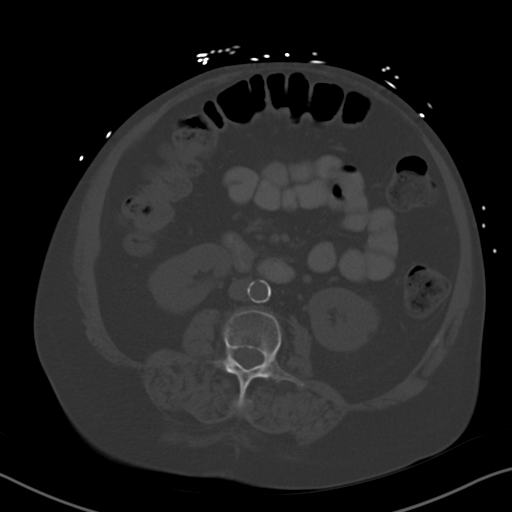
[im 56/77  soft-tissue]
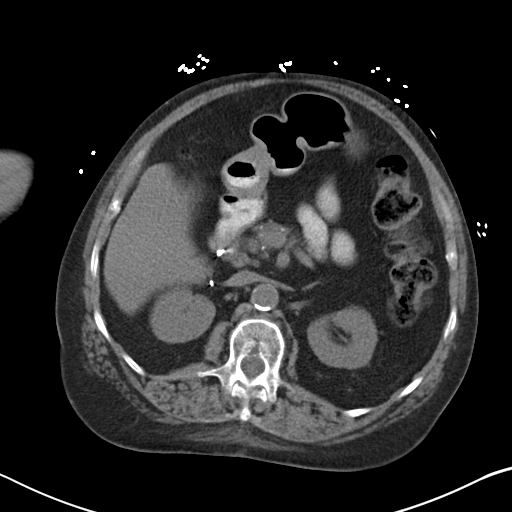
[im 59/77  soft-tissue]
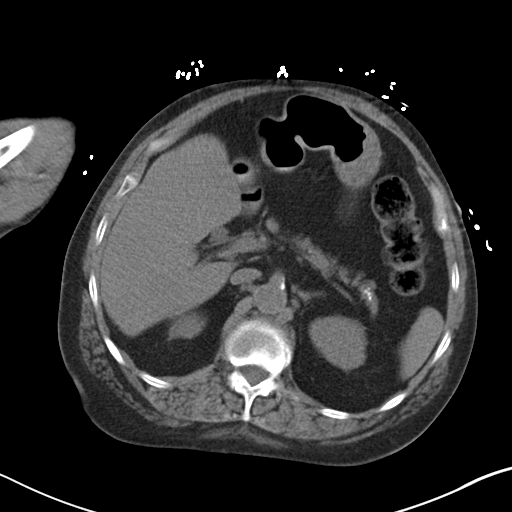
[im 66/77  soft-tissue]
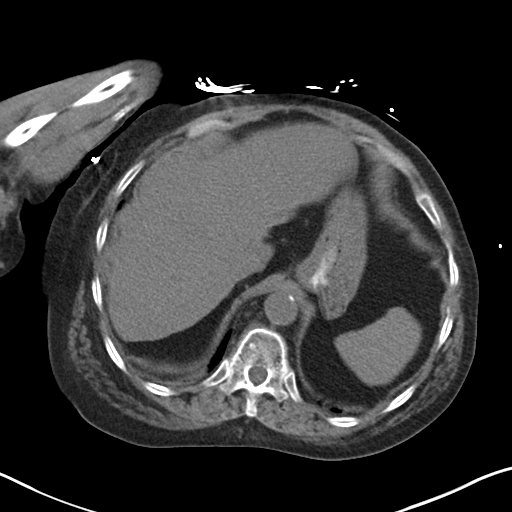
[im 73/77  soft-tissue]
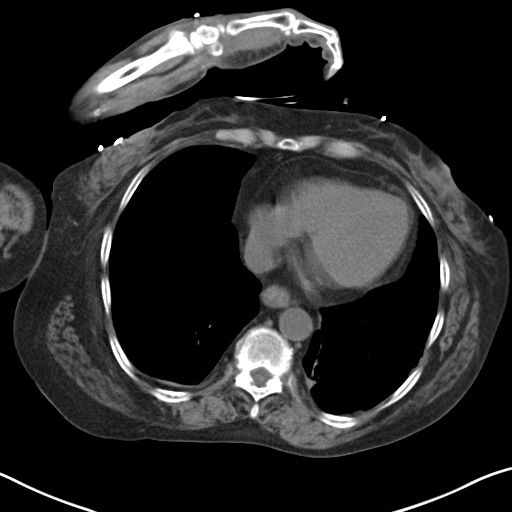

[Series 4: coronal · coronal · 0.63mm/px · 3 of 149 slices shown]
[im 50/149  soft-tissue]
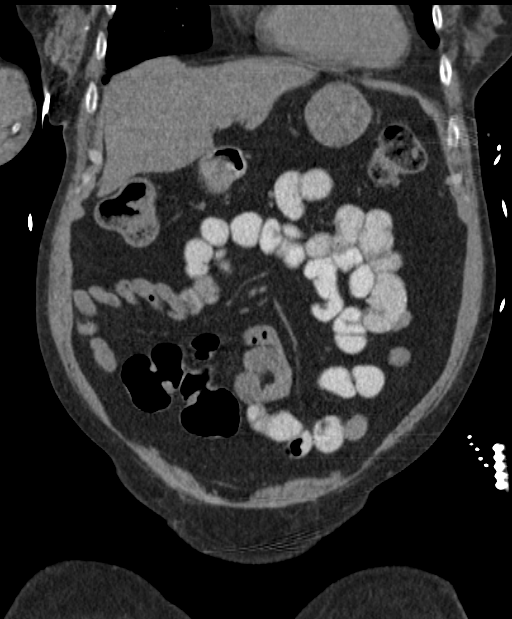
[im 66/149  soft-tissue]
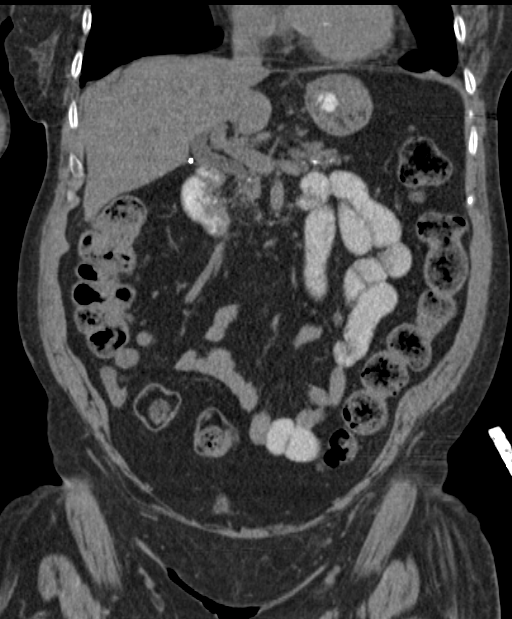
[im 83/149  soft-tissue]
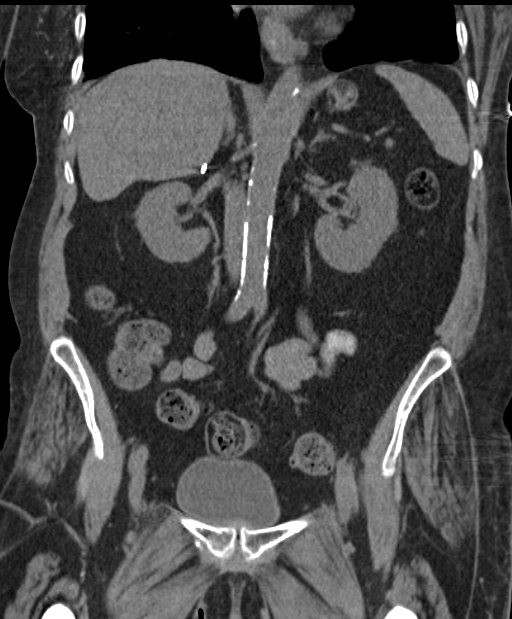

[16 of 46 positions shown; findings below may reference images not displayed]

FINDINGS: Mild bibasilar atelectasis or scarring is noted.

The liver and spleen are unremarkable in appearance. The patient is
status post cholecystectomy, with clips noted at the gallbladder
fossa. The pancreas and adrenal glands are unremarkable.

Scattered calcifications are seen within the pancreas, likely
reflecting sequelae of chronic pancreatitis. The adrenal glands are
unremarkable in appearance.

The kidneys are unremarkable in appearance. There is no evidence of
hydronephrosis. No renal or ureteral stones are seen. Minimal
nonspecific perinephric stranding is noted bilaterally.

No free fluid is identified. The small bowel is unremarkable in
appearance. The stomach is within normal limits. No acute vascular
abnormalities are seen. Scattered calcification is seen along the
abdominal aorta and its branches.

The patient is status post appendectomy. Minimal diverticulosis is
noted along the proximal sigmoid colon, without evidence of
diverticulitis.

The bladder is mildly distended and grossly unremarkable. The uterus
is unremarkable in appearance. No suspicious adnexal masses are
seen. No inguinal lymphadenopathy is seen.

No acute osseous abnormalities are identified. Chronic loss of
height is noted at vertebral bodies T12 and L1. There is mild grade
1 anterolisthesis of L4 on L5, with associated vacuum phenomenon and
underlying facet disease.
IMPRESSION: 1. No acute abnormality seen within the abdomen or pelvis.
2. Minimal diverticulosis along the proximal sigmoid colon, without
evidence of diverticulitis.
3. Scattered calcifications within the pancreas likely reflects
sequelae of chronic pancreatitis.
4. Mild bibasilar atelectasis or scarring noted.
5. Chronic loss of height at vertebral bodies T12 and L1.

## 2016-07-11 IMAGING — DX DG CHEST 1V PORT
1 series · 1 of 1 positions shown · non-contrast
Comparison: 05/19/2016 and earlier.

CLINICAL DATA: 59-year-old female with elevated troponin. Initial
encounter.

EXAM:
PORTABLE CHEST 1 VIEW

[chest ap]
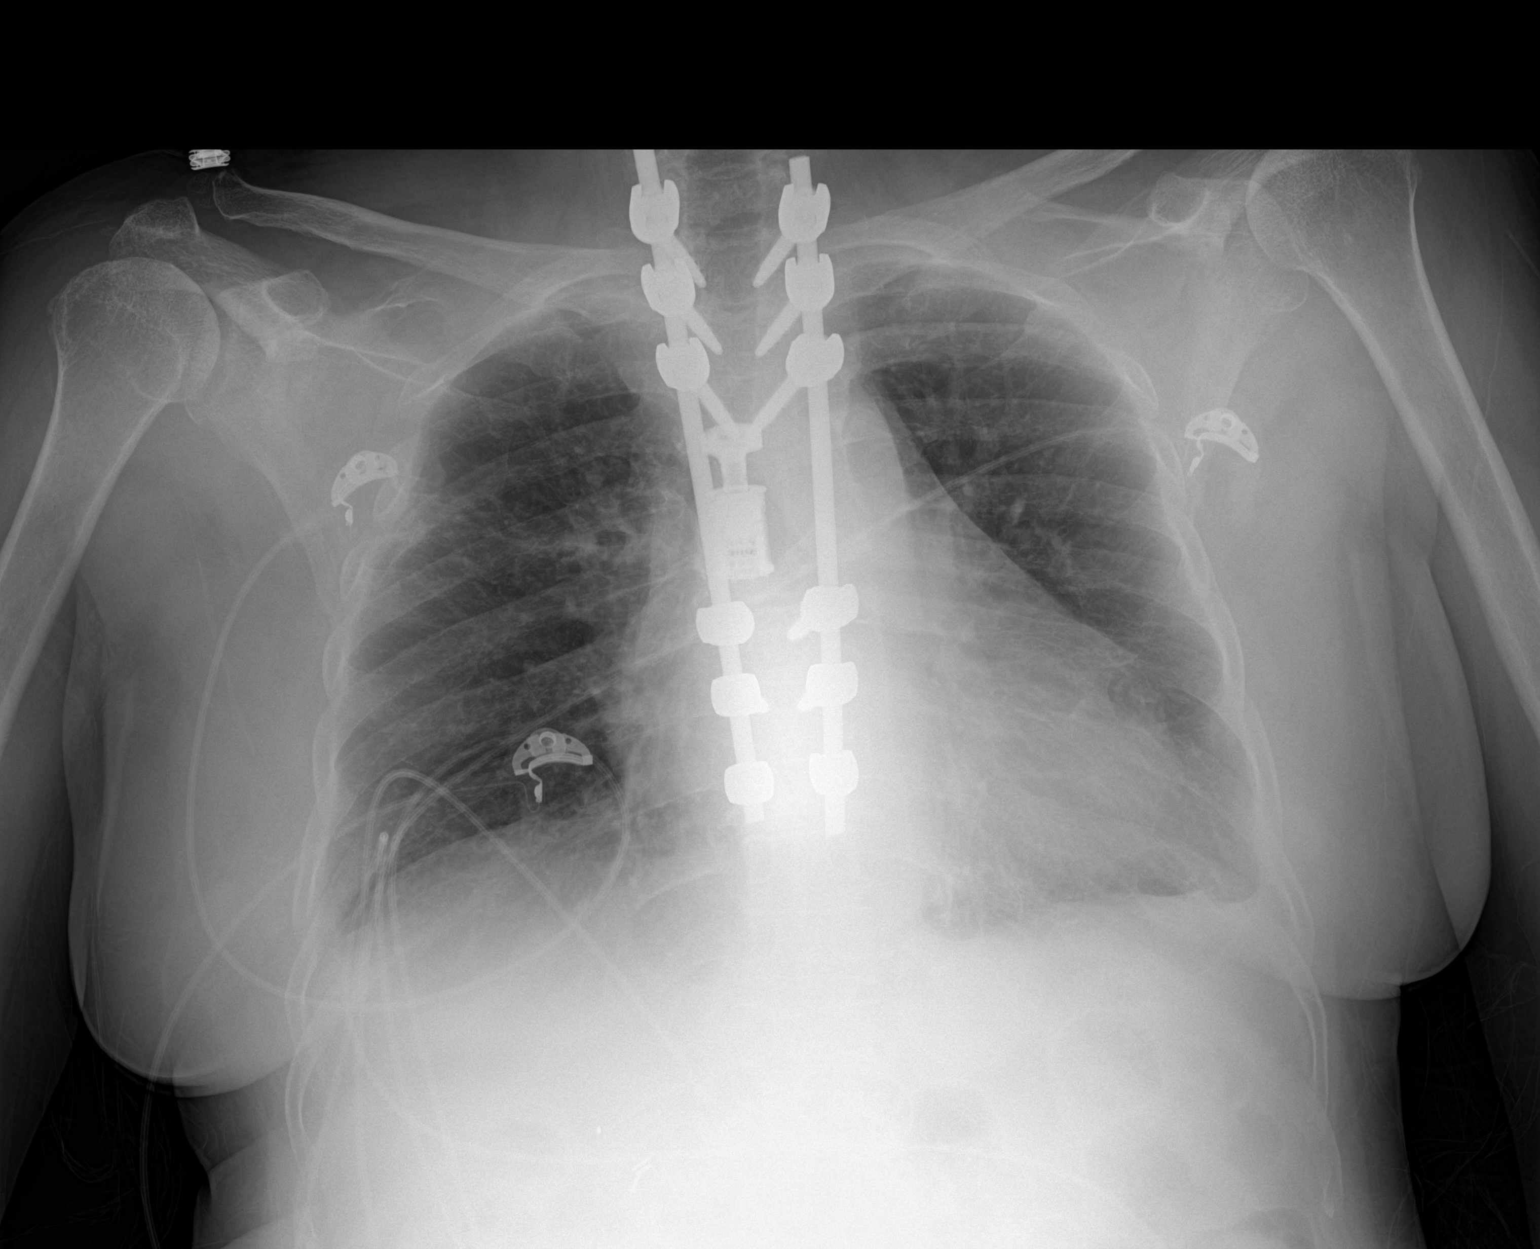

[1 of 1 positions shown; findings below may reference images not displayed]

FINDINGS: Portable AP semi upright view at 8625 hours. Thoracic spine hardware
appears stable. Stable lung volumes. Mediastinal contours are stable
and within normal limits. No pneumothorax. No pulmonary edema.
Stable costophrenic angle blunting with no definite pleural
effusion. Reticulonodular opacity in the right upper lobe appears
chronic. Associated chronic right lateral rib deformities. No
definite acute pulmonary opacity. Stable cholecystectomy clips.
IMPRESSION: Stable chest.  No acute cardiopulmonary abnormality identified.

## 2016-08-17 IMAGING — CT CT CERVICAL SPINE W/O CM
3 of 4 series · 12 of 35 positions shown, 14 images · non-contrast
Comparison: 05/25/2015 .

CLINICAL DATA: Multiple recent falls with neck pain, initial
encounter

EXAM:
CT CERVICAL SPINE WITHOUT CONTRAST
TECHNIQUE: Multidetector CT imaging of the cervical spine was performed without
intravenous contrast. Multiplanar CT image reconstructions were also
generated.

[Series 7: sagittal bone · sagittal · 0.42mm/px · 5 of 76 slices shown, 6 images]
[im 26/76  bone]
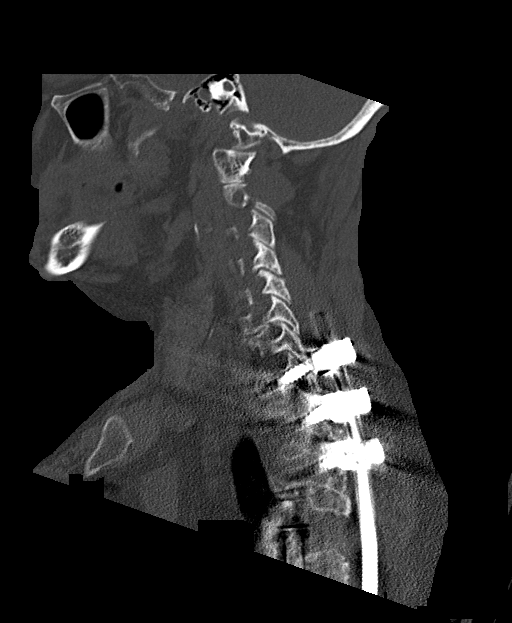
[im 32/76  bone]
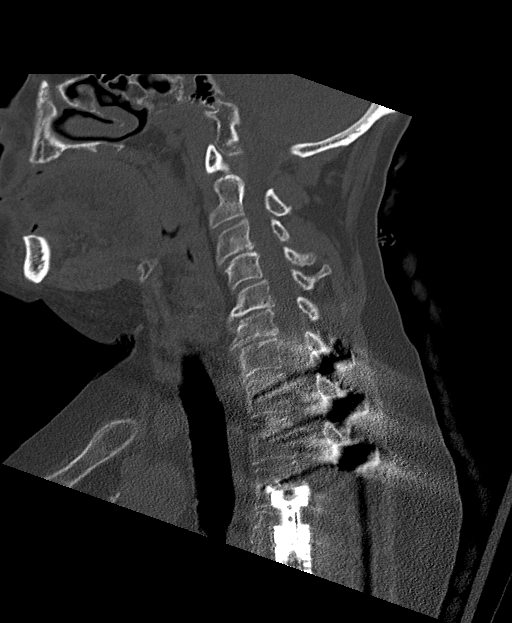
[im 38/76  soft-tissue]
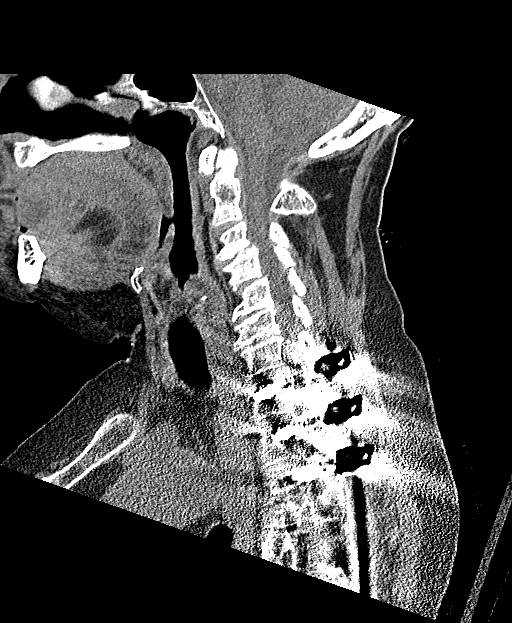
[im 38/76  bone]
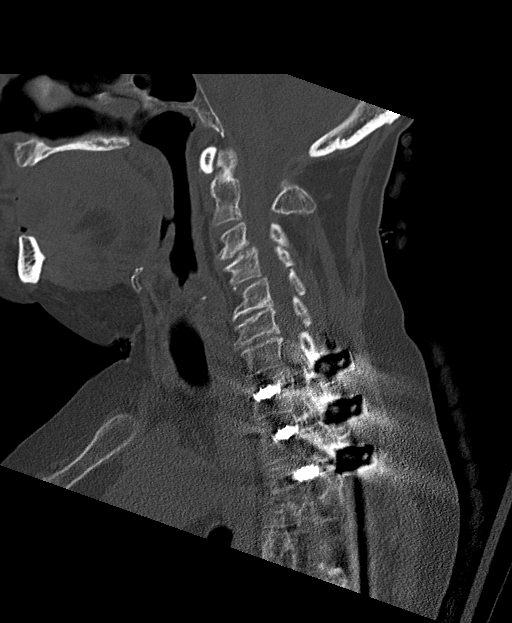
[im 44/76  bone]
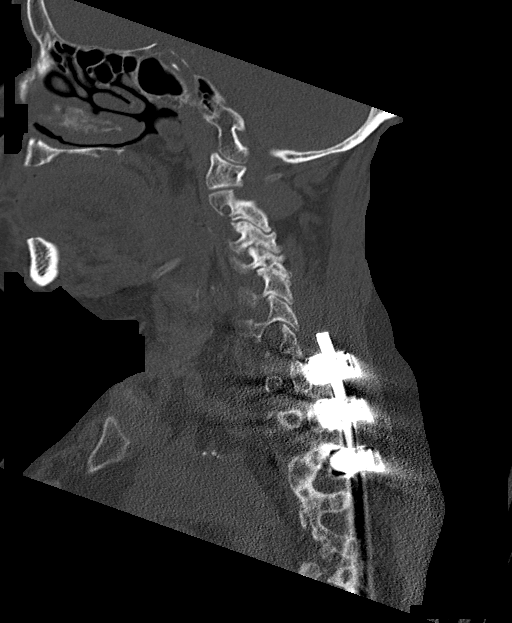
[im 51/76  bone]
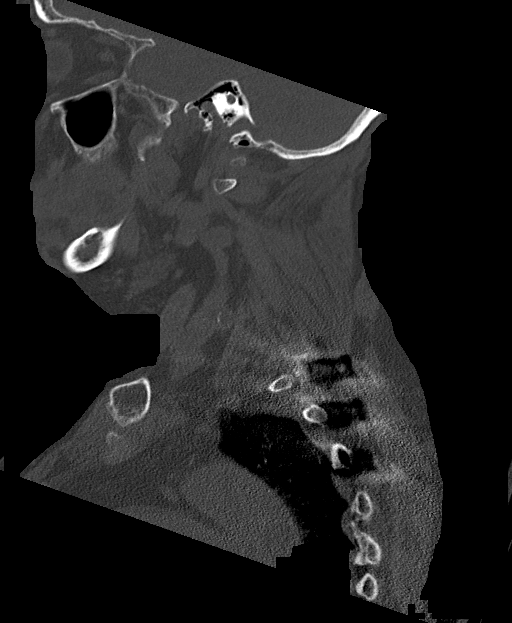

[Series 8: coronal bone · coronal · 0.29mm/px · 3 of 66 slices shown]
[im 14/66  bone]
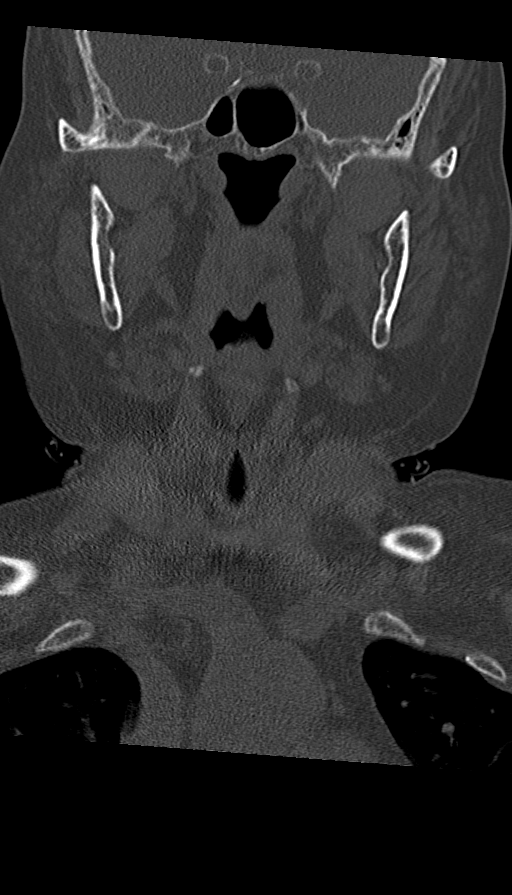
[im 27/66  bone]
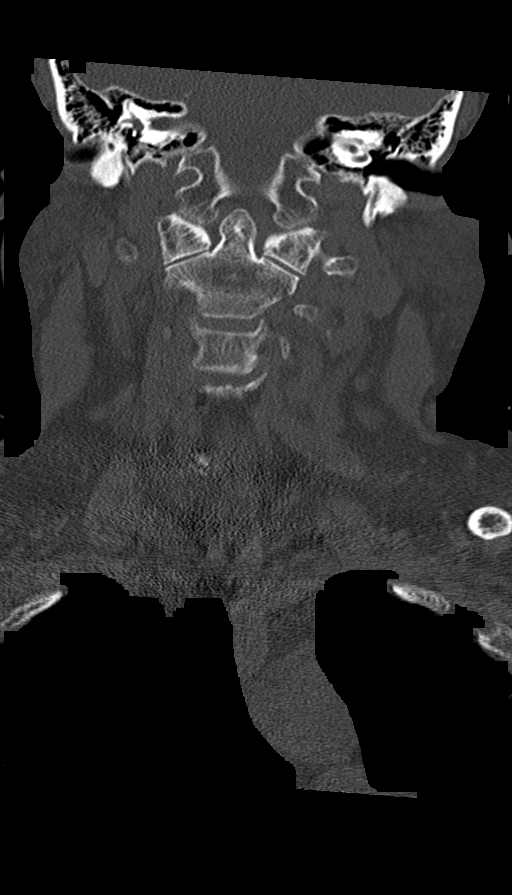
[im 40/66  bone]
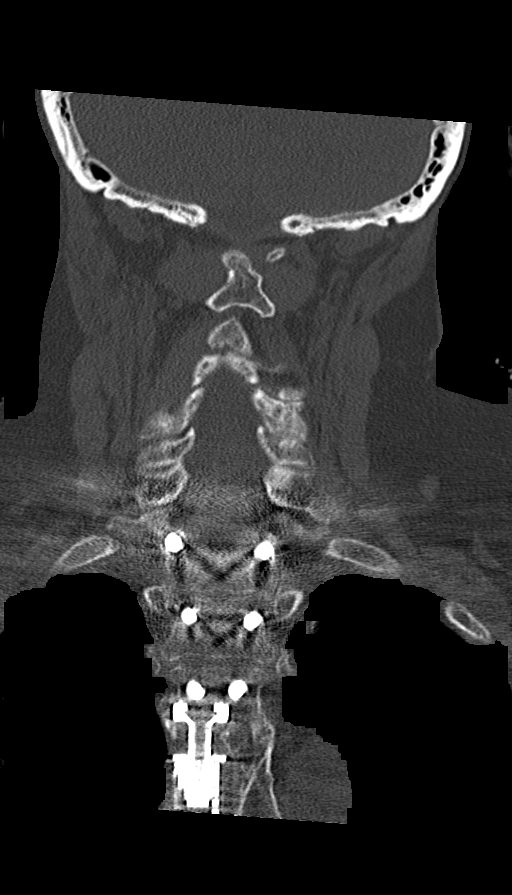

[Series 9: orthogonal bone · axial · 0.37mm/px · z∈[-250,-74]mm · 4 of 132 slices shown, 5 images]
[im 19/132  soft-tissue]
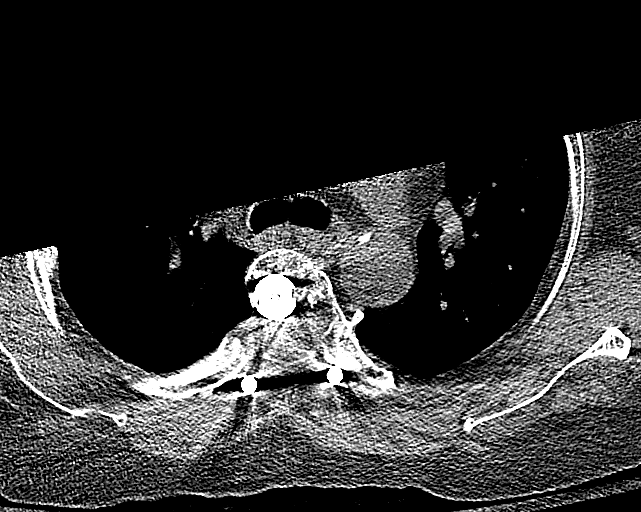
[im 19/132  bone]
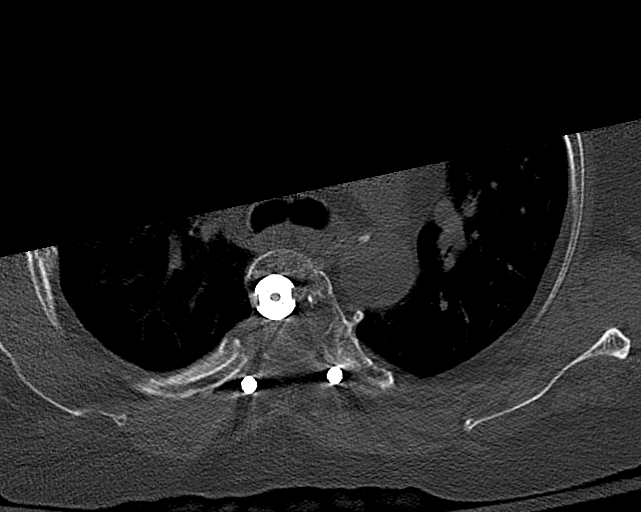
[im 57/132  bone]
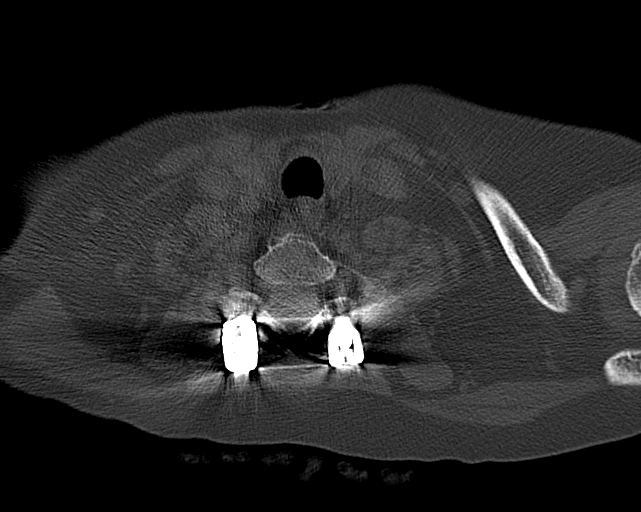
[im 75/132  bone]
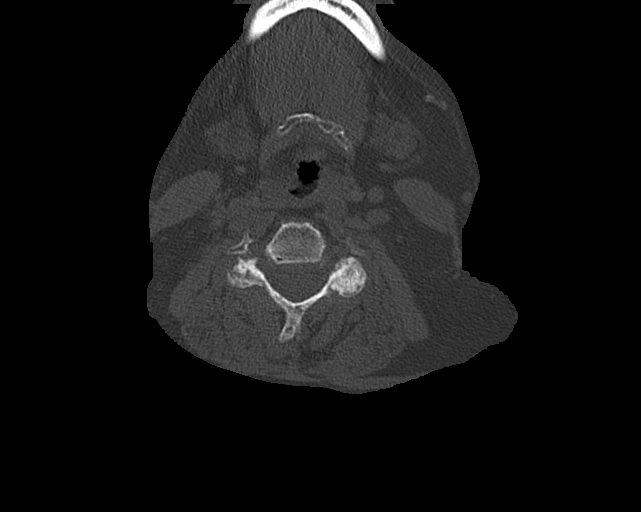
[im 113/132  bone]
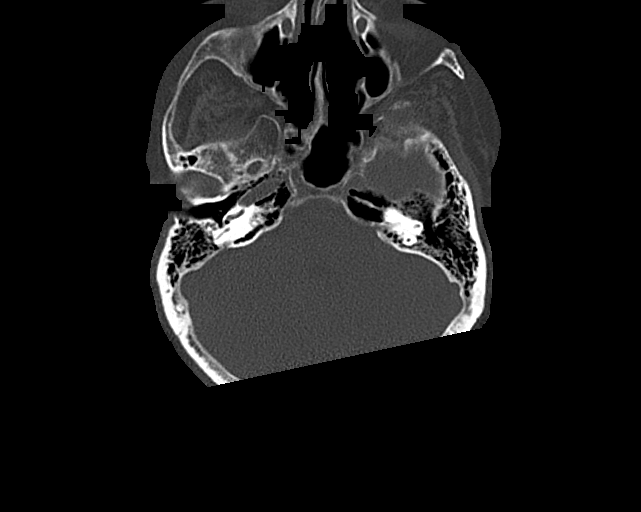

[12 of 35 positions shown; findings below may reference images not displayed]

FINDINGS: Seven cervical segments are well visualized. Vertebral body height
is well maintained. No acute fracture or acute facet abnormality is
noted. Multilevel facet hypertrophic changes are seen. Mild stable
anterolisthesis of C3 on C4 is noted. Disc space narrowing is noted
at C5-6 and C6-7 with associated osteophytes. Postsurgical changes
are noted at T1 and extending inferiorly through the visualized
thoracic spine. No acute soft tissue abnormality is seen. The lungs
are well aerated bilaterally. Old rib fractures are noted on the
right.
IMPRESSION: Postsurgical changes in the upper thoracic spine.

Multilevel degenerative changes in the cervical spine stable from
the prior exam.

## 2016-08-17 IMAGING — DX DG WRIST COMPLETE 3+V*L*
4 series · 4 of 4 positions shown · non-contrast
Comparison: 02/28/2016

CLINICAL DATA: Fall 3 days ago onto outstretched hand. Left wrist
injury and pain. Initial encounter.

EXAM:
LEFT WRIST - COMPLETE 3+ VIEW

[wrist pa]
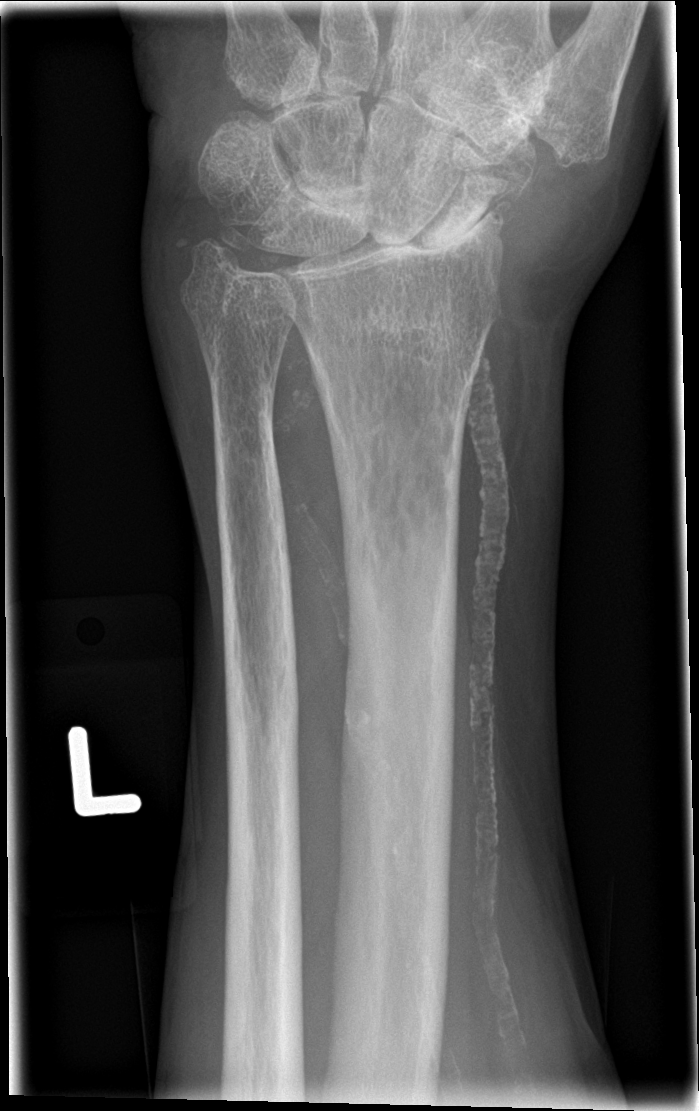

[wrist obl]
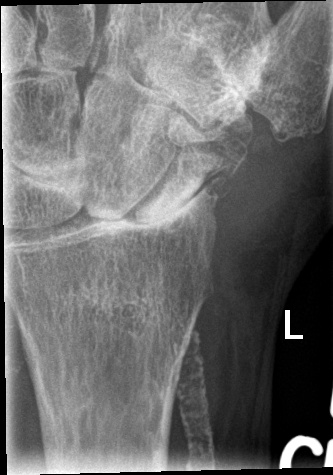

[wrist lat]
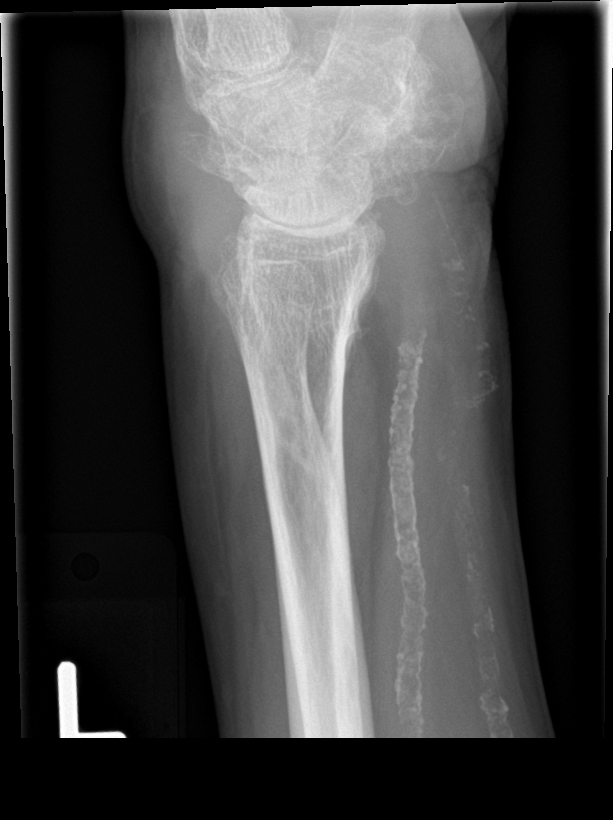

[wrist navicular]
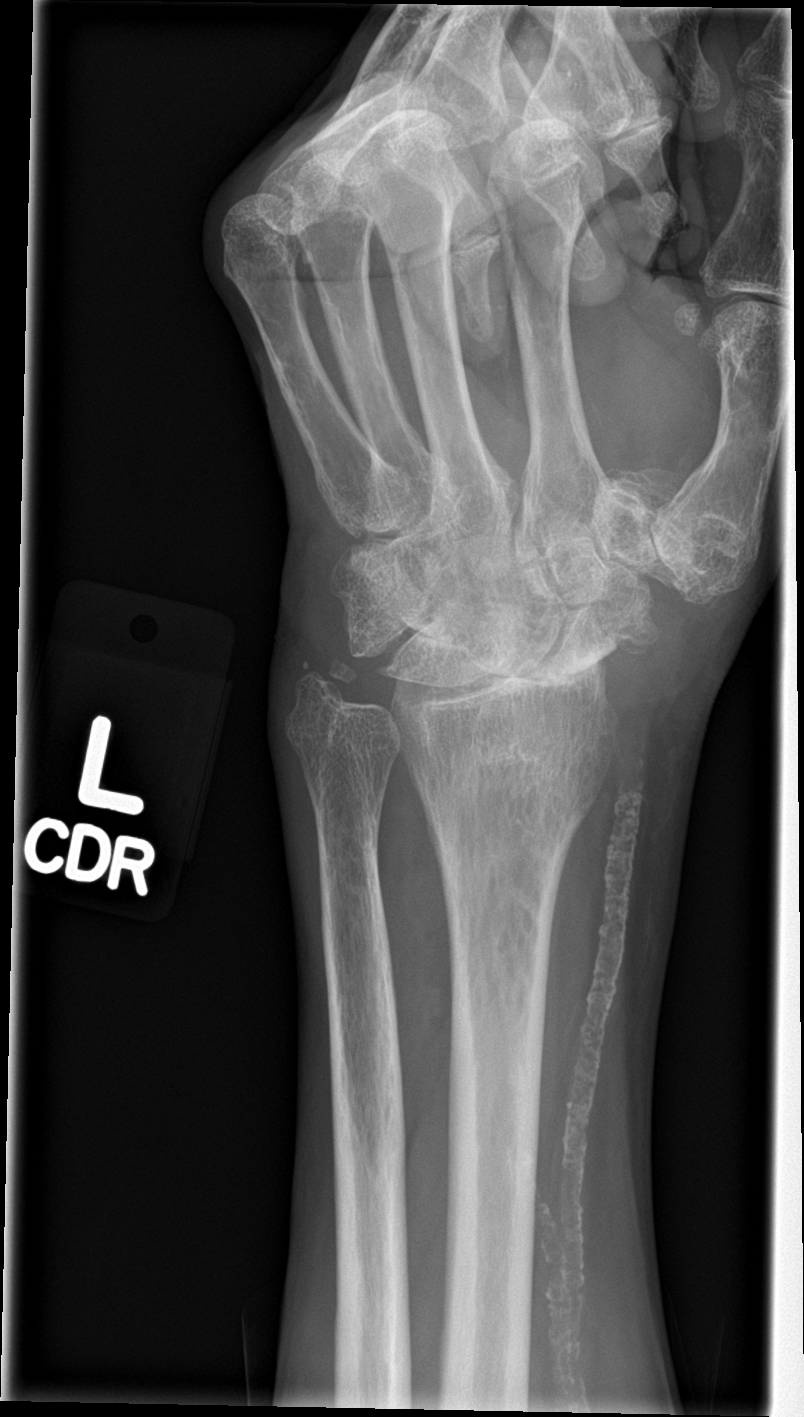

[4 of 4 positions shown; findings below may reference images not displayed]

FINDINGS: There is no evidence of fracture or dislocation.

Severe peripheral vascular calcification noted. Advanced
osteoarthritis is seen involving the radiocarpal joint with
scapholunate widening, and prominent sclerosis of the proximal pole
of scaphoid. This is consistent with scapholunate advanced collapse,
and is unchanged in appearance since previous study. Advanced
osteoarthritis is also seen involving the STT joint complex and base
of thumb.
IMPRESSION: No acute findings.

Scapholunate advanced collapse and associated osteoarthritis,
without significant change.

Extensive peripheral vascular calcification .

## 2016-08-17 IMAGING — CT CT ABD-PELV W/O CM
2 of 4 series · 16 of 46 positions shown, 18 images · non-contrast
Comparison: 05/25/2016

CLINICAL DATA: Back pain post fall, painful urination ; history
hypertension, type II diabetes mellitus, asthma,

EXAM:
CT ABDOMEN AND PELVIS WITHOUT CONTRAST
TECHNIQUE: Multidetector CT imaging of the abdomen and pelvis was performed
following the standard protocol without IV contrast. IV contrast not
administered due to contrast allergy. Oral contrast was not
administered.

[Series 2: axial st · axial · 0.85mm/px · z∈[-669,-289]mm · 13 of 84 slices shown, 15 images]
[im 4/84  soft-tissue]
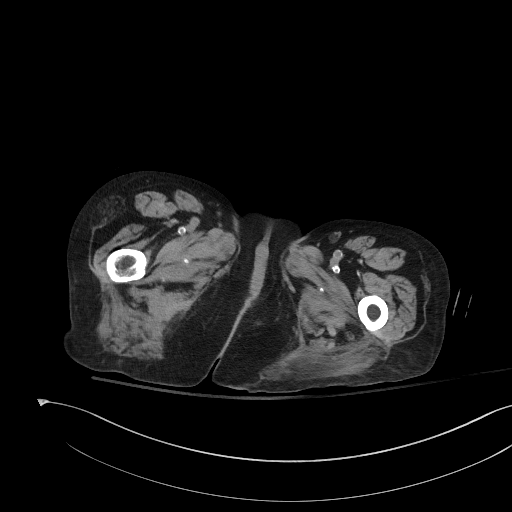
[im 4/84  bone]
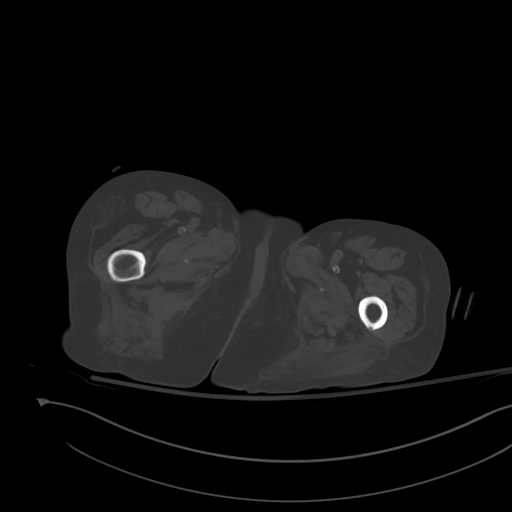
[im 10/84  soft-tissue]
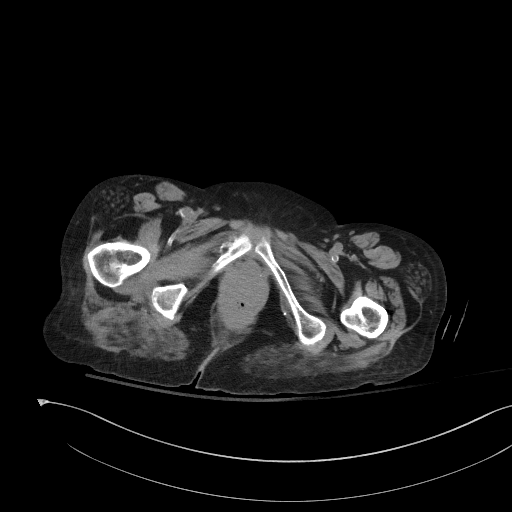
[im 17/84  soft-tissue]
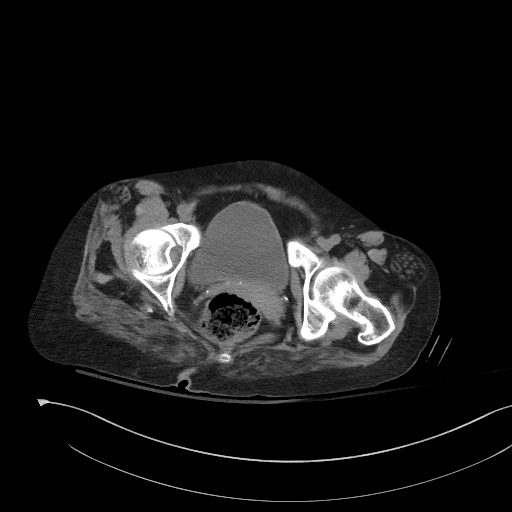
[im 24/84  soft-tissue]
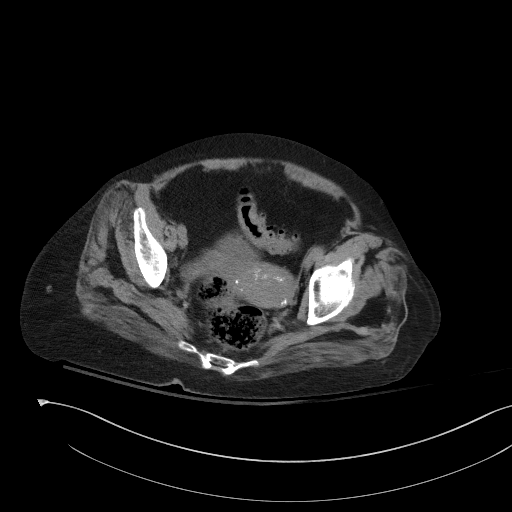
[im 30/84  soft-tissue]
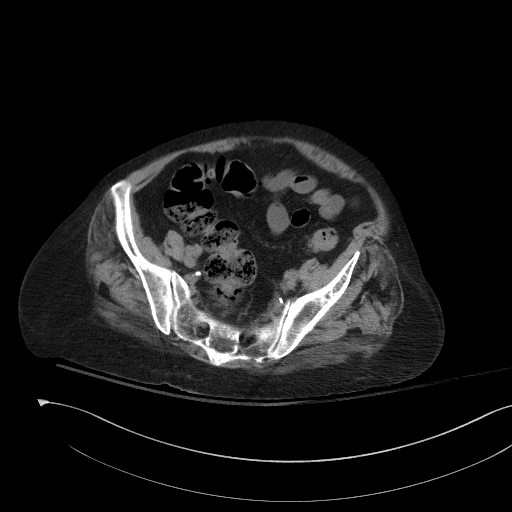
[im 37/84  soft-tissue]
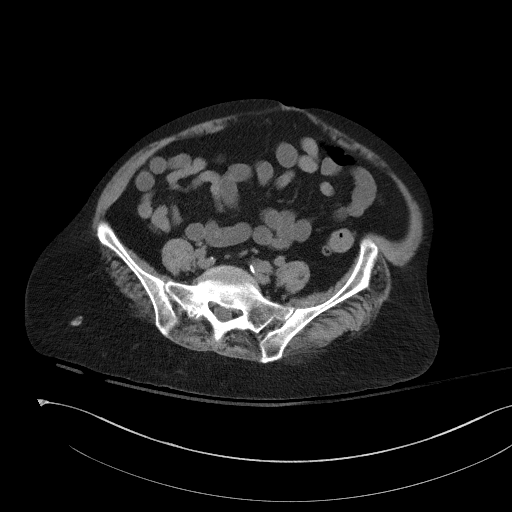
[im 44/84  soft-tissue]
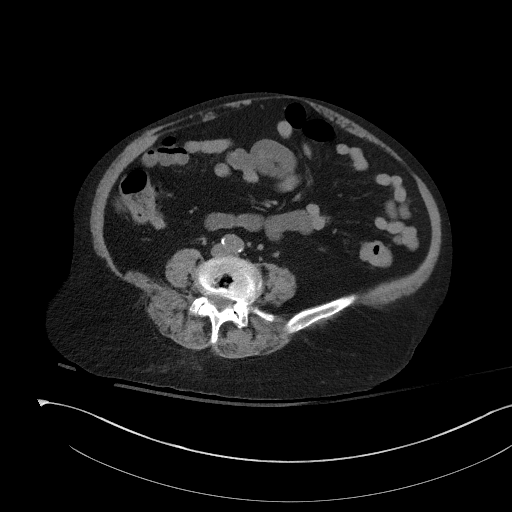
[im 47/84  soft-tissue]
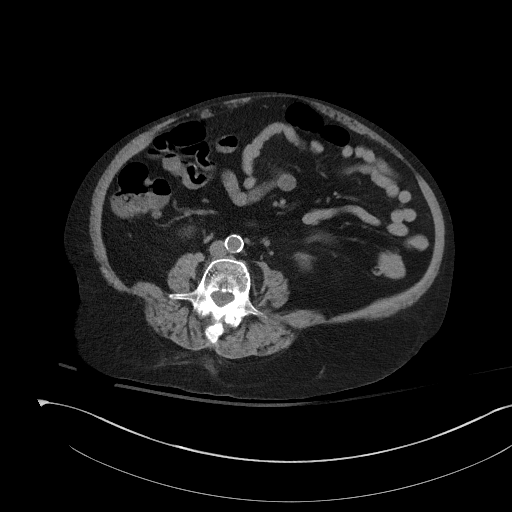
[im 54/84  soft-tissue]
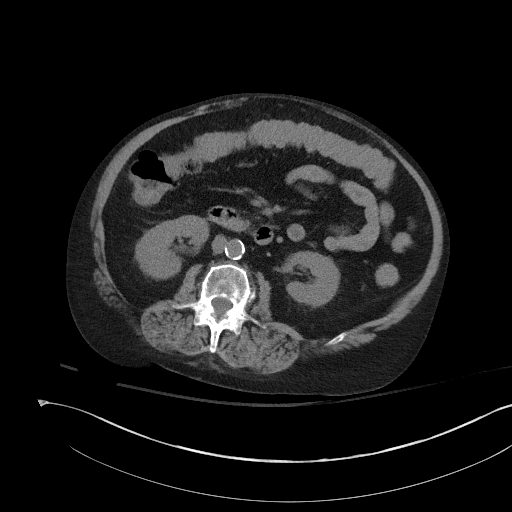
[im 54/84  bone]
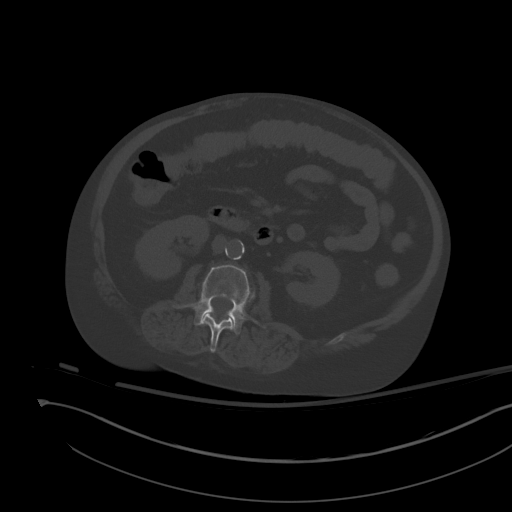
[im 60/84  soft-tissue]
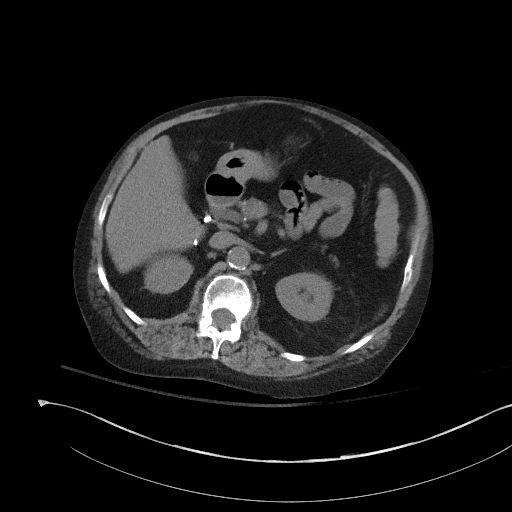
[im 67/84  soft-tissue]
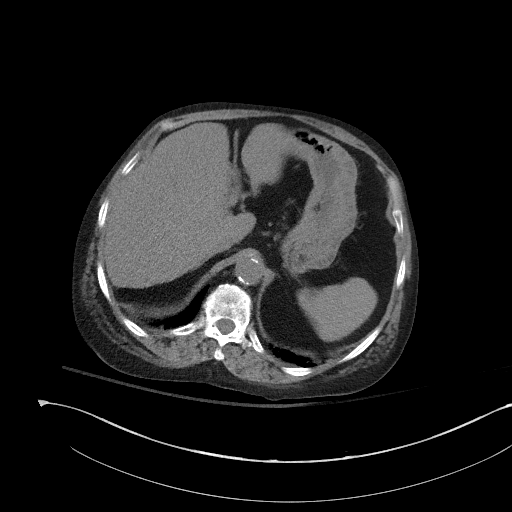
[im 74/84  soft-tissue]
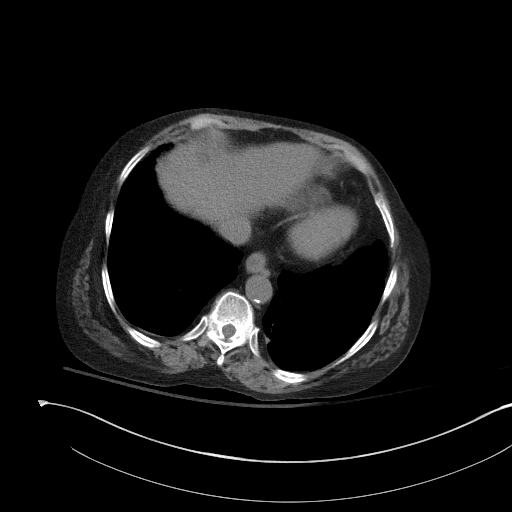
[im 80/84  soft-tissue]
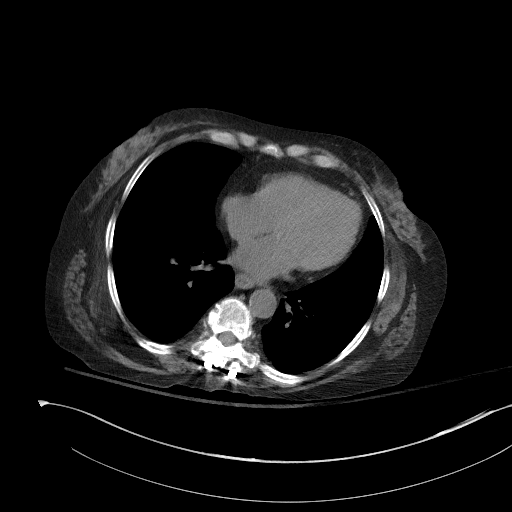

[Series 5: coronal st · coronal · 0.76mm/px · 3 of 85 slices shown]
[im 29/85  soft-tissue]
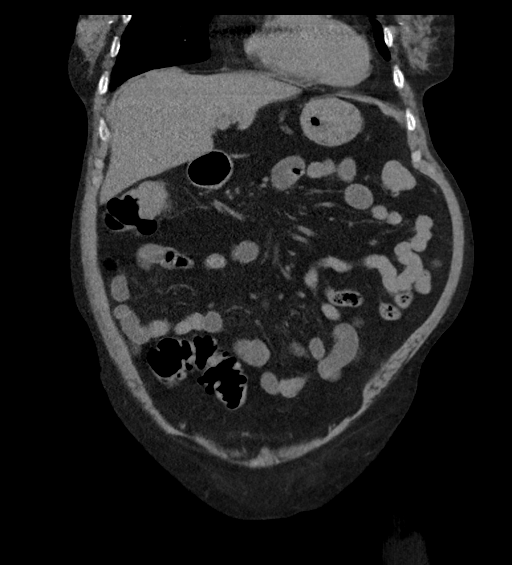
[im 38/85  soft-tissue]
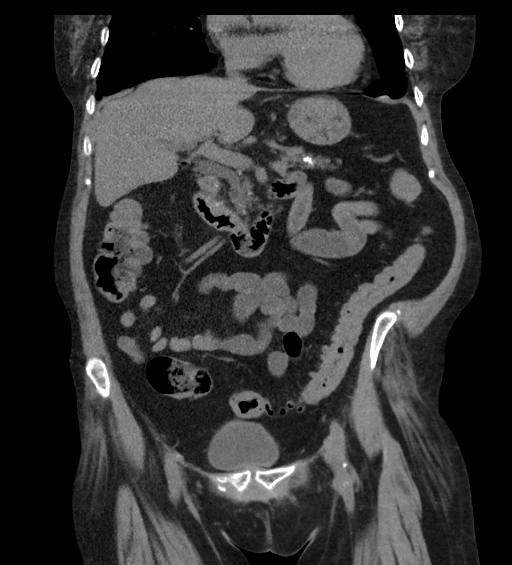
[im 47/85  soft-tissue]
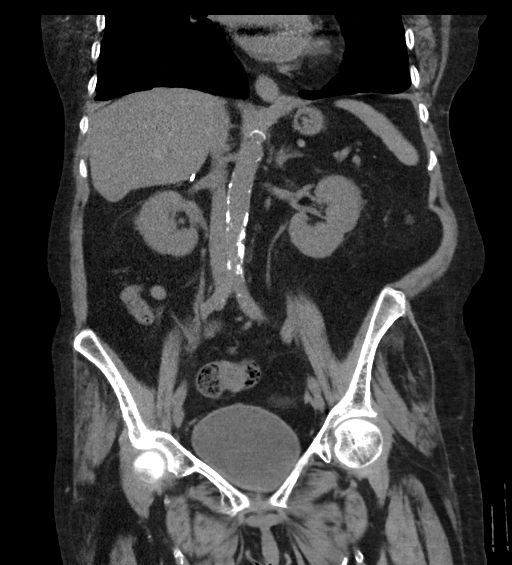

[16 of 46 positions shown; findings below may reference images not displayed]

FINDINGS: Lower chest:  Minimal atelectasis LEFT base.

Hepatobiliary: Gallbladder surgically absent. No focal hepatic
abnormalities.

Pancreas: Scattered pancreatic calcifications. No ductal dilatation
or mass.

Spleen: Normal appearance

Adrenals/Urinary Tract: Adrenal glands normal appearance. Kidneys,
ureters, and bladder normal appearance. No urinary tract
calcification or dilatation.

Stomach/Bowel: Appendix not visualized but no pericecal inflammatory
process is seen. Scattered colonic diverticula. Colon under
distended limiting assessment of wall thickness, cannot exclude
colonic wall thickening/colitis of the transverse through proximal
sigmoid colon.

Vascular/Lymphatic: Extensive atherosclerotic calcifications aorta
and throughout abdomen and pelvis. Aorta normal caliber. No
adenopathy.

Reproductive: Normal appearing uterus and adnexa.

Other: No free air, free fluid, or abnormal fluid collection.
Question LEFT inguinal hernia containing fat.

Musculoskeletal: Diffuse osseous demineralization. Compression
fractures of T12 and L1, with inferior endplate L1 fracture
progressive since prior study.
IMPRESSION: Question colonic wall thickening/ colitis above the transverse
through proximal sigmoid colon, though colon is unopacified and
underdistended, limiting assessment.

Scattered colonic diverticulosis.

Probable LEFT inguinal hernia containing fat.

Increased compression fracture inferior endplate L1.

Aortic atherosclerosis.

## 2016-08-17 IMAGING — DX DG HAND COMPLETE 3+V*L*
3 series · 3 of 3 positions shown · non-contrast
Comparison: 02/28/2016

CLINICAL DATA: Fall 3 days ago onto outstretched hand. Left hand
pain. Initial encounter.

EXAM:
LEFT HAND - COMPLETE 3+ VIEW

[hand pa]
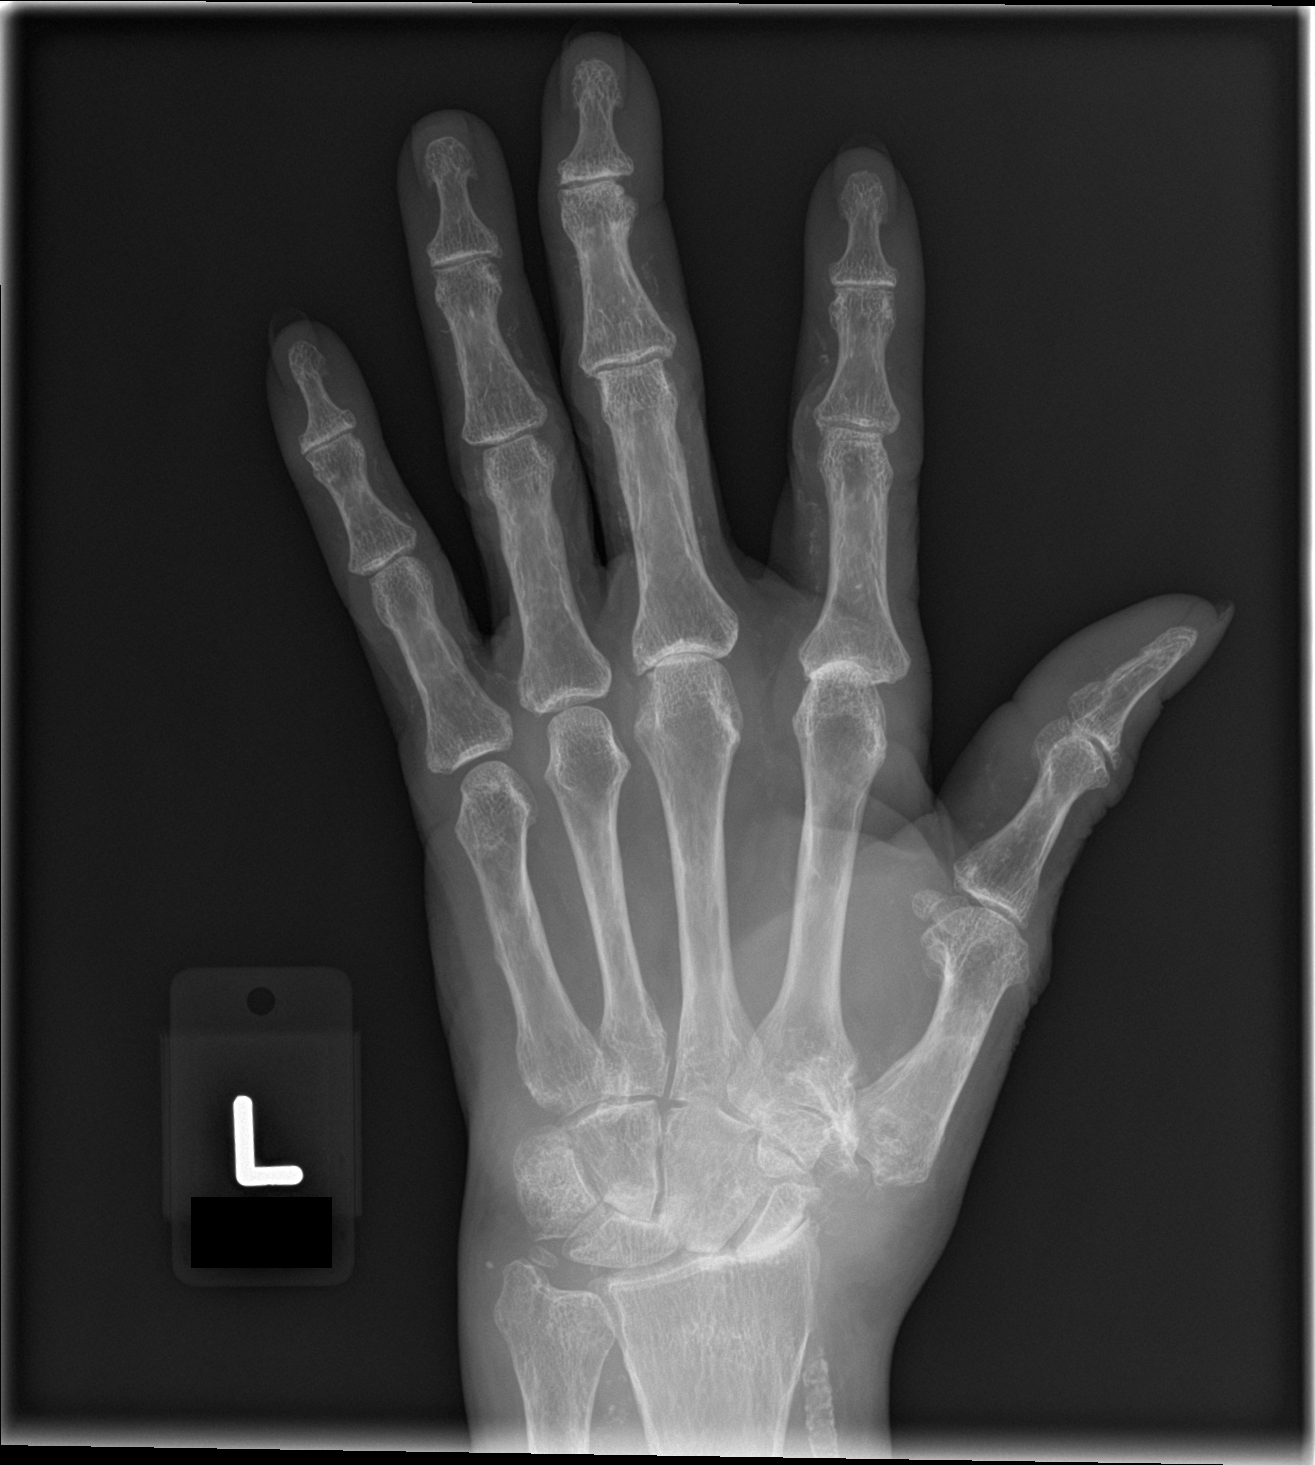

[hand obl]
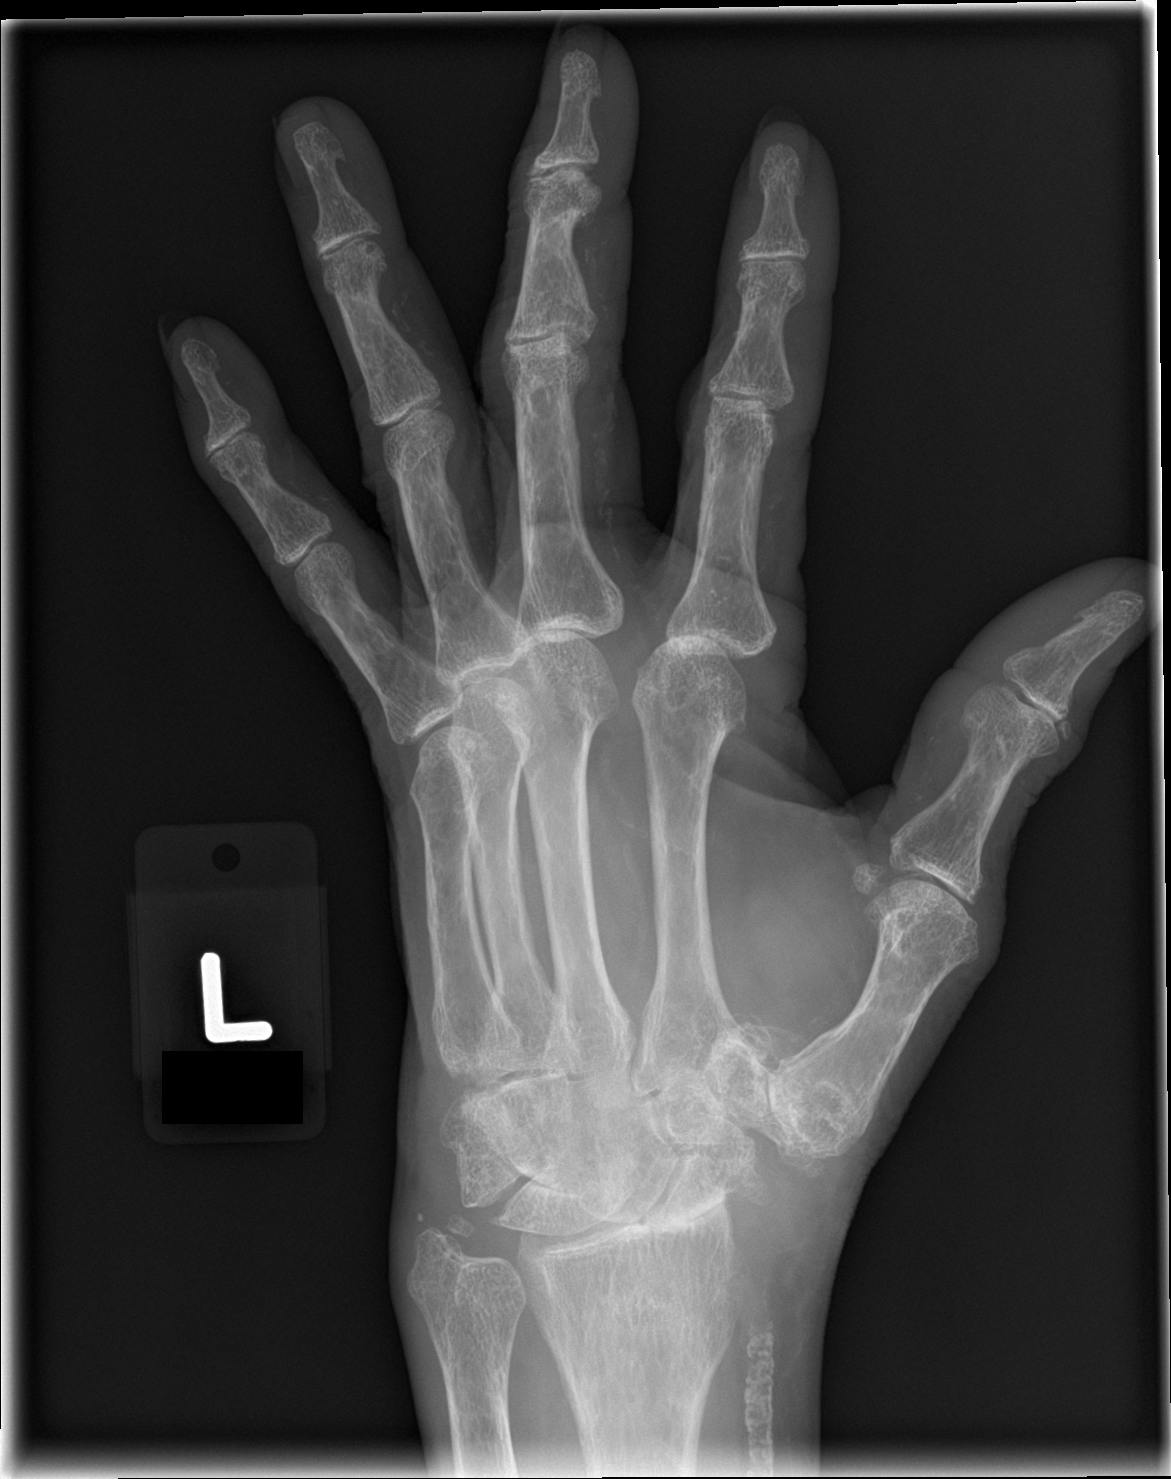

[hand lat]
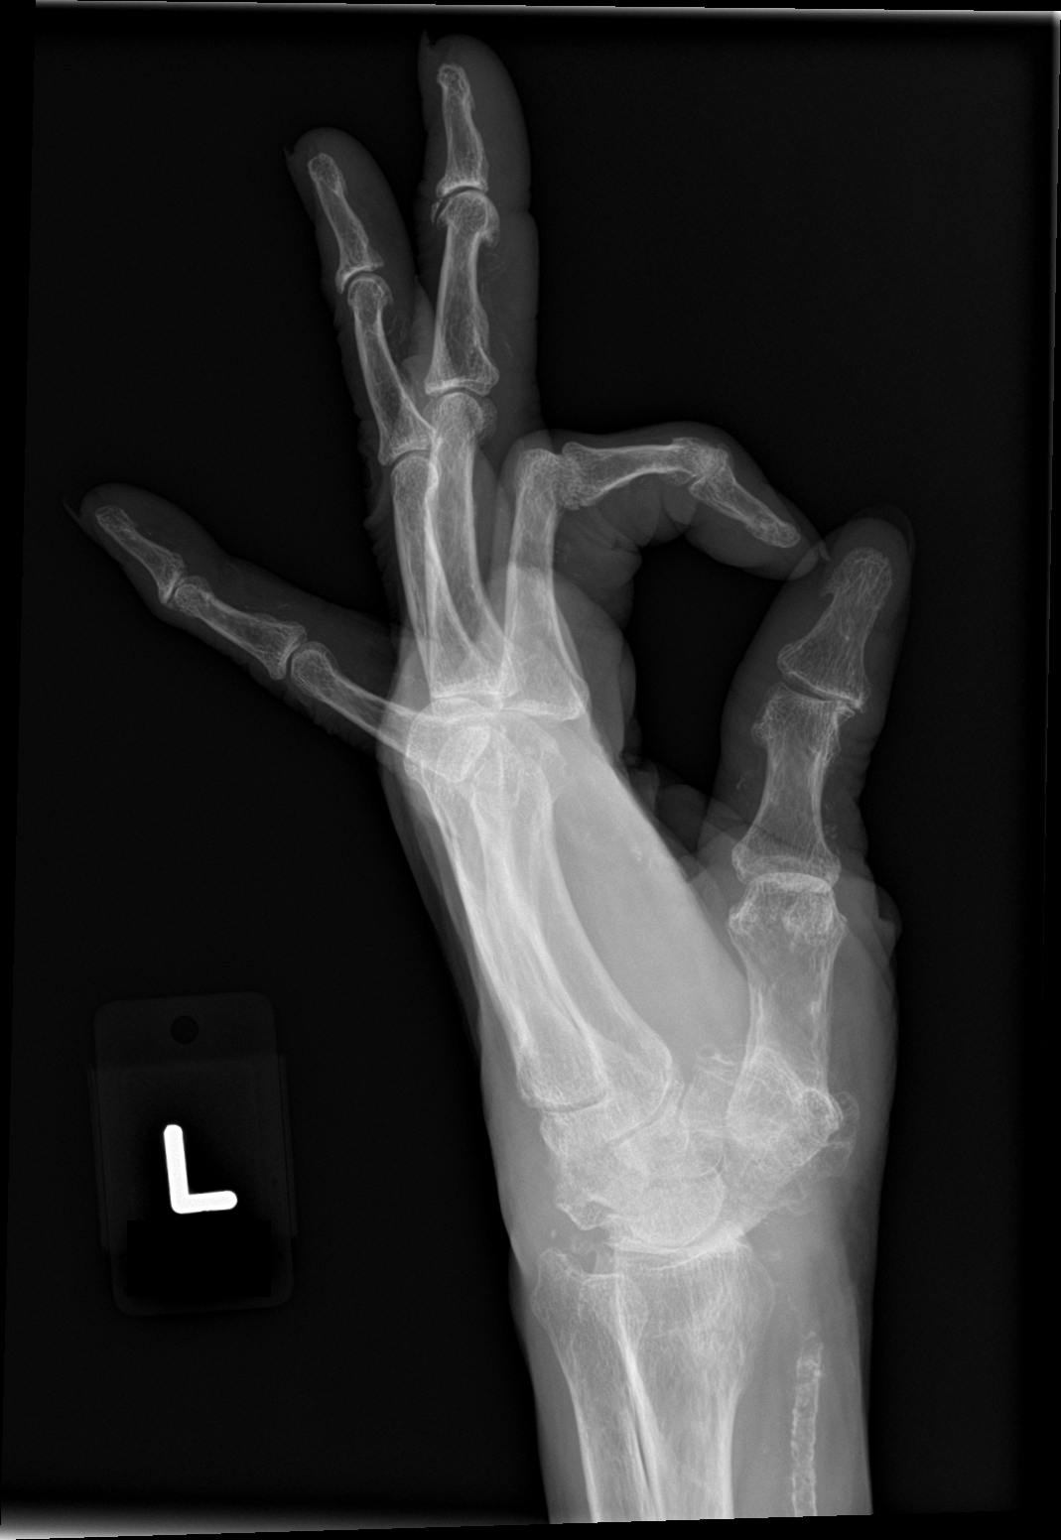

[3 of 3 positions shown; findings below may reference images not displayed]

FINDINGS: There is no evidence of fracture or dislocation. Mild to moderate
osteoarthritis is seen involving the distal interphalangeal joints
of all digits as well as the second and third MCP joints. Advanced
osteoarthritis is also seen involving the base of the thumb.
Peripheral vascular calcification noted.
IMPRESSION: No acute findings.

Osteoarthritis, as described above. Peripheral vascular
calcification.

## 2016-08-22 ENCOUNTER — Encounter (HOSPITAL_COMMUNITY): Payer: Self-pay | Admitting: Emergency Medicine

## 2016-08-22 ENCOUNTER — Emergency Department (HOSPITAL_COMMUNITY)
Admission: EM | Admit: 2016-08-22 | Discharge: 2016-08-23 | Disposition: A | Discharge status: Home / Self Care | Payer: Medicaid Other | Attending: Emergency Medicine | Admitting: Emergency Medicine

## 2016-08-22 ENCOUNTER — Emergency Department (HOSPITAL_COMMUNITY): Payer: Medicaid Other

## 2016-08-22 DIAGNOSIS — F1123 Opioid dependence with withdrawal: Secondary | ICD-10-CM | POA: Insufficient documentation

## 2016-08-22 DIAGNOSIS — E876 Hypokalemia: Secondary | ICD-10-CM | POA: Insufficient documentation

## 2016-08-22 DIAGNOSIS — R609 Edema, unspecified: Secondary | ICD-10-CM | POA: Diagnosis not present

## 2016-08-22 DIAGNOSIS — I10 Essential (primary) hypertension: Secondary | ICD-10-CM | POA: Diagnosis not present

## 2016-08-22 DIAGNOSIS — K5900 Constipation, unspecified: Secondary | ICD-10-CM | POA: Diagnosis not present

## 2016-08-22 DIAGNOSIS — Z87891 Personal history of nicotine dependence: Secondary | ICD-10-CM | POA: Diagnosis not present

## 2016-08-22 DIAGNOSIS — Z79899 Other long term (current) drug therapy: Secondary | ICD-10-CM | POA: Insufficient documentation

## 2016-08-22 DIAGNOSIS — F1193 Opioid use, unspecified with withdrawal: Secondary | ICD-10-CM

## 2016-08-22 DIAGNOSIS — E119 Type 2 diabetes mellitus without complications: Secondary | ICD-10-CM | POA: Insufficient documentation

## 2016-08-22 DIAGNOSIS — Z7982 Long term (current) use of aspirin: Secondary | ICD-10-CM | POA: Diagnosis not present

## 2016-08-22 DIAGNOSIS — Z7984 Long term (current) use of oral hypoglycemic drugs: Secondary | ICD-10-CM | POA: Diagnosis not present

## 2016-08-22 DIAGNOSIS — R52 Pain, unspecified: Secondary | ICD-10-CM | POA: Diagnosis present

## 2016-08-22 DIAGNOSIS — J45909 Unspecified asthma, uncomplicated: Secondary | ICD-10-CM | POA: Insufficient documentation

## 2016-08-22 LAB — BASIC METABOLIC PANEL
ANION GAP: 11 (ref 5–15)
BUN: 13 mg/dL (ref 6–20)
CALCIUM: 7.8 mg/dL — AB (ref 8.9–10.3)
CO2: 23 mmol/L (ref 22–32)
Chloride: 104 mmol/L (ref 101–111)
Creatinine, Ser: 1.21 mg/dL — ABNORMAL HIGH (ref 0.44–1.00)
GFR calc Af Amer: 56 mL/min — ABNORMAL LOW (ref >60)
GFR, EST NON AFRICAN AMERICAN: 48 mL/min — AB (ref >60)
GLUCOSE: 114 mg/dL — AB (ref 65–99)
Potassium: 2.8 mmol/L — ABNORMAL LOW (ref 3.5–5.1)
Sodium: 138 mmol/L (ref 135–145)

## 2016-08-22 LAB — I-STAT TROPONIN, ED: TROPONIN I, POC: 0 ng/mL (ref 0.00–0.08)

## 2016-08-22 LAB — URINALYSIS, ROUTINE W REFLEX MICROSCOPIC
BILIRUBIN URINE: NEGATIVE
GLUCOSE, UA: NEGATIVE mg/dL
Hgb urine dipstick: NEGATIVE
Ketones, ur: NEGATIVE mg/dL
LEUKOCYTES UA: NEGATIVE
Nitrite: NEGATIVE
PH: 6.5 (ref 5.0–8.0)
Protein, ur: NEGATIVE mg/dL
Specific Gravity, Urine: 1.012 (ref 1.005–1.030)

## 2016-08-22 LAB — CBC
HCT: 31.9 % — ABNORMAL LOW (ref 36.0–46.0)
HEMOGLOBIN: 10.1 g/dL — AB (ref 12.0–15.0)
MCH: 24.3 pg — ABNORMAL LOW (ref 26.0–34.0)
MCHC: 31.7 g/dL (ref 30.0–36.0)
MCV: 76.9 fL — ABNORMAL LOW (ref 78.0–100.0)
Platelets: 298 10*3/uL (ref 150–400)
RBC: 4.15 MIL/uL (ref 3.87–5.11)
RDW: 16.1 % — ABNORMAL HIGH (ref 11.5–15.5)
WBC: 14.2 10*3/uL — ABNORMAL HIGH (ref 4.0–10.5)

## 2016-08-22 LAB — CBG MONITORING, ED: GLUCOSE-CAPILLARY: 111 mg/dL — AB (ref 65–99)

## 2016-08-22 LAB — LIPASE, BLOOD: Lipase: 23 U/L (ref 11–51)

## 2016-08-22 MED ORDER — FUROSEMIDE 10 MG/ML IJ SOLN
40.0000 mg | Freq: Once | INTRAMUSCULAR | Status: AC
  Administered 2016-08-22: 40 mg via INTRAVENOUS
  Filled 2016-08-22: qty 4

## 2016-08-22 MED ORDER — POTASSIUM CHLORIDE CRYS ER 20 MEQ PO TBCR
40.0000 meq | EXTENDED_RELEASE_TABLET | Freq: Once | ORAL | Status: AC
  Administered 2016-08-22: 40 meq via ORAL
  Filled 2016-08-22: qty 2

## 2016-08-22 MED ORDER — MORPHINE SULFATE (PF) 4 MG/ML IV SOLN
4.0000 mg | Freq: Once | INTRAVENOUS | Status: AC
  Administered 2016-08-22: 4 mg via INTRAVENOUS
  Filled 2016-08-22: qty 1

## 2016-08-22 MED ORDER — POTASSIUM CHLORIDE CRYS ER 20 MEQ PO TBCR: 40.0000 meq | EXTENDED_RELEASE_TABLET | Freq: Two times a day (BID) | ORAL | 0 refills | Status: DC

## 2016-08-22 MED ORDER — LACTULOSE 10 GM/15ML PO SOLN
30.0000 g | Freq: Once | ORAL | Status: AC
  Administered 2016-08-22: 30 g via ORAL
  Filled 2016-08-22: qty 45

## 2016-08-22 MED ORDER — DICYCLOMINE HCL 10 MG PO CAPS
10.0000 mg | ORAL_CAPSULE | Freq: Once | ORAL | Status: AC
  Administered 2016-08-22: 10 mg via ORAL
  Filled 2016-08-22: qty 1

## 2016-08-22 MED ORDER — ONDANSETRON HCL 4 MG/2ML IJ SOLN
4.0000 mg | Freq: Once | INTRAMUSCULAR | Status: AC
  Administered 2016-08-22: 4 mg via INTRAVENOUS
  Filled 2016-08-22: qty 2

## 2016-08-22 MED ORDER — KETOROLAC TROMETHAMINE 30 MG/ML IJ SOLN
30.0000 mg | Freq: Once | INTRAMUSCULAR | Status: AC
  Administered 2016-08-22: 30 mg via INTRAVENOUS
  Filled 2016-08-22: qty 1

## 2016-08-22 MED ORDER — LACTULOSE 10 GM/15ML PO SOLN: 20.0000 g | Freq: Two times a day (BID) | ORAL | 0 refills | Status: DC | PRN

## 2016-08-22 NOTE — ED Notes (Signed)
Called main lab to add on lipase.  

## 2016-08-22 NOTE — ED Provider Notes (Signed)
[facility]-EMERGENCY DEPT Provider Note   CSN: [id number] Arrival date & time: 08/22/16  1913     History   Chief Complaint Chief Complaint  Patient presents with  . Chest Pain  . Generalized Body Aches    HPI Amber [name] is a 60 y.o. female.  Pt presents to the ED with everything hurting her.  The pt said that she's been out of her pain meds for 2 days.  The pt also c/o swelling to her legs.  She said they get better when they are elevated, but then get swollen again.  She has been taking her diuretics.  Pt was admitted in July for constipation and hypokalemia.  She does not feel like she is constipated today.      Past Medical History:  Diagnosis Date  . Arthritis   . Asthma   . Chronic pain   . Constipation   . Diabetes mellitus without complication (HCC)   . Falls frequently   . GERD (gastroesophageal reflux disease)   . Hypertension   . Pneumonia 10/2015  . Seizures [facility])    last seizure March 2015    Patient Active Problem List   Diagnosis Date Noted  . Wrist sprain 07/01/2016  . Intractable abdominal pain 05/25/2016  . Sepsis (HCC) 02/29/2016  . Fever 02/28/2016  . Elevated troponin 12/24/2015  . AKI (acute kidney injury) (HCC) 12/24/2015  . Duodenitis   . Fracture of left great toe   . Pulmonary infiltrates   . Chronic pain 11/07/2015  . Nausea with vomiting 11/07/2015  . Abdominal pain 11/07/2015  . Constipation 11/07/2015  . Abdominal distension   . Hypomagnesemia 08/25/2015  . Chest pain, musculoskeletal 08/24/2015  . CAP (community acquired pneumonia)   . Diabetes mellitus type 2, controlled (HCC) 07/27/2015  . Chest pain 07/27/2015  . HCAP (healthcare-associated pneumonia) 07/27/2015  . Chronic back pain   . Frequent falls   . Essential hypertension   . Pulmonary hypertension   . Type 2 diabetes mellitus with complication (HCC)   . Seizures (HCC)   . Gout   . Asthma 06/12/2015  . Acute respiratory failure with hypoxia (HCC) 06/12/2015    . ACS (acute coronary syndrome) (HCC) 06/12/2015  . Asthma exacerbation 06/12/2015  . Acute respiratory failure (HCC) 06/12/2015  . Pain in the chest   . DM type 2 causing complication (HCC) 01/27/2015  . Microcytic anemia 01/26/2015  . Hypokalemia 01/26/2015  . Hypertension 01/26/2015  . History of seizures 01/26/2015  . Hypotension 01/25/2015    Past Surgical History:  Procedure Laterality Date  . APPENDECTOMY    . BACK SURGERY    . CHOLECYSTECTOMY      OB History    No data available       Home Medications    Prior to Admission medications   Medication Sig Start Date End Date Taking? Authorizing Provider  albuterol (PROVENTIL HFA;VENTOLIN HFA) 108 (90 BASE) MCG/ACT inhaler Inhale 2 puffs into the lungs every 6 (six) hours as needed for wheezing or shortness of breath.    Historical Provider, MD  amLODipine (NORVASC) 5 MG tablet Take 5 mg by mouth daily.    Historical Provider, MD  aspirin EC 81 MG tablet Take 1 tablet (81 mg total) by mouth daily. 06/14/15   Richarda Overlie, MD  atorvastatin (LIPITOR) 10 MG tablet Take 10 mg by mouth daily. 08/13/15   Historical Provider, MD  diazepam (VALIUM) 5 MG tablet Take 1 tablet (5 mg total) by mouth 2 (  two) times daily. 05/04/16   Elson Areas, PA-C  feeding supplement, ENSURE ENLIVE, (ENSURE ENLIVE) LIQD Take 237 mLs by mouth 2 (two) times daily between meals. 11/10/15   Catarina Hartshorn, MD  Fluticasone-Salmeterol (ADVAIR) 250-50 MCG/DOSE AEPB Inhale 1 puff into the lungs 2 (two) times daily.    Historical Provider, MD  folic acid (FOLVITE) 1 MG tablet Take 1 mg by mouth daily.    Historical Provider, MD  furosemide (LASIX) 40 MG tablet Take 1 tablet (40 mg total) by mouth daily. 05/28/16   Penny Pia, MD  gabapentin (NEURONTIN) 300 MG capsule Take 1 capsule (300 mg total) by mouth 2 (two) times daily. 06/14/15   Richarda Overlie, MD  HYDROcodone-acetaminophen (NORCO/VICODIN) 5-325 MG tablet Take 1 tablet by mouth every 4 (four) hours as needed.  07/01/16   Rolland Porter, MD  lactulose (CHRONULAC) 10 GM/15ML solution Take 30 mLs (20 g total) by mouth 2 (two) times daily as needed for severe constipation. 08/22/16   Jacalyn Lefevre, MD  LINZESS 145 MCG CAPS capsule Take 1 capsule (145 mcg total) by mouth daily as needed (constipation). 05/26/16   Penny Pia, MD  metFORMIN (GLUCOPHAGE-XR) 500 MG 24 hr tablet Take 500 mg by mouth daily with breakfast.    Historical Provider, MD  metoprolol succinate (TOPROL-XL) 25 MG 24 hr tablet Take 25 mg by mouth daily.    Historical Provider, MD  oxymorphone 20 MG T12A Take 20 mg by mouth every 12 (twelve) hours. 03/01/16   Richarda Overlie, MD  pantoprazole (PROTONIX) 40 MG tablet Take 1 tablet (40 mg total) by mouth 2 (two) times daily before a meal. 12/25/15   Ripudeep K Rai, MD  polyethylene glycol (MIRALAX / GLYCOLAX) packet Take 17 g by mouth 2 (two) times daily. 05/26/16   Penny Pia, MD  potassium chloride SA (K-DUR,KLOR-CON) 20 MEQ tablet Take 2 tablets (40 mEq total) by mouth 2 (two) times daily. 08/22/16   Jacalyn Lefevre, MD  predniSONE (DELTASONE) 10 MG tablet Take 15 mg by mouth daily with breakfast.    Historical Provider, MD  promethazine (PHENERGAN) 25 MG tablet Take 1 tablet by mouth every 6 (six) hours as needed for nausea or vomiting. nausea 10/06/15   Historical Provider, MD  Vitamin D, Ergocalciferol, (DRISDOL) 50000 UNITS CAPS capsule Take 1 capsule (50,000 Units total) by mouth every 7 (seven) days. Sunday Patient taking differently: Take 50,000 Units by mouth every 7 (seven) days.  01/27/15   Calvert Cantor, MD  VITAMIN E PO Take 1 capsule by mouth daily.    Historical Provider, MD    Family History Family History  Problem Relation Age of Onset  . Kidney failure Mother   . Hypertension Sister   . Hypertension Brother     Social History Social History  Substance Use Topics  . Smoking status: Former Games developer  . Smokeless tobacco: Never Used  . Alcohol use No     Comment: admits to 2  drinks/week     Allergies   Ivp dye [iodinated diagnostic agents]   Review of Systems Review of Systems  Cardiovascular: Positive for chest pain and leg swelling.  Gastrointestinal: Positive for abdominal pain and nausea.  All other systems reviewed and are negative.    Physical Exam Updated Vital Signs BP (!) 176/115   Pulse (!) 139   Temp 98 F (36.7 C) (Oral)   Resp 17   SpO2 100%   Physical Exam  Constitutional: She is oriented to person, place, and time.  She appears well-developed and well-nourished.  HENT:  Head: Normocephalic and atraumatic.  Right Ear: External ear normal.  Left Ear: External ear normal.  Nose: Nose normal.  Mouth/Throat: Oropharynx is clear and moist.  Eyes: Conjunctivae and EOM are normal. Pupils are equal, round, and reactive to light.  Neck: Normal range of motion. Neck supple.  Cardiovascular: Normal rate, regular rhythm, normal heart sounds and intact distal pulses.   Pulmonary/Chest: Effort normal and breath sounds normal.  Abdominal: Soft. Bowel sounds are normal.  Musculoskeletal: Normal range of motion. She exhibits edema.  Neurological: She is alert and oriented to person, place, and time.  Skin: Skin is warm.  Psychiatric: Her behavior is normal. Judgment and thought content normal. Her mood appears anxious.  Nursing note and vitals reviewed.    ED Treatments / Results  Labs (all labs ordered are listed, but only abnormal results are displayed) Labs Reviewed  BASIC METABOLIC PANEL - Abnormal; Notable for the following:       Result Value   Potassium 2.8 (*)    Glucose, Bld 114 (*)    Creatinine, Ser 1.21 (*)    Calcium 7.8 (*)    GFR calc non Af Amer 48 (*)    GFR calc Af Amer 56 (*)    All other components within normal limits  CBC - Abnormal; Notable for the following:    WBC 14.2 (*)    Hemoglobin 10.1 (*)    HCT 31.9 (*)    MCV 76.9 (*)    MCH 24.3 (*)    RDW 16.1 (*)    All other components within normal  limits  CBG MONITORING, ED - Abnormal; Notable for the following:    Glucose-Capillary 111 (*)    All other components within normal limits  LIPASE, BLOOD  URINALYSIS, ROUTINE W REFLEX MICROSCOPIC (NOT AT [facility])  I-STAT TROPOININ, ED    EKG  EKG Interpretation  Date/Time:  Tuesday August 22 2016 19:23:25 EDT Ventricular Rate:  89 PR Interval:  110 QRS Duration: 54 QT Interval:  336 QTC Calculation: 408 R Axis:   72 Text Interpretation:  Sinus rhythm with sinus arrhythmia with short PR Nonspecific ST and T wave abnormality Abnormal ECG Confirmed by [facility] MD, Mayan Dolney ([id number]) on 08/22/2016 8:57:14 PM       Radiology Dg Chest 2 View  Result Date: 08/22/2016 CLINICAL DATA:  Chest pain with shortness of breath EXAM: CHEST  2 VIEW COMPARISON:  May 25, 2016 FINDINGS: There is mild scarring in the left base region. There is no edema or consolidation. Heart is upper normal in size with pulmonary vascularity within normal limits. No adenopathy. There is extensive postoperative change in the thoracic spine region, grossly stable. Atherosclerotic calcification is noted in the abdominal aorta. IMPRESSION: Slight scarring left base. No edema or consolidation. There is abdominal aortic atherosclerosis. Electronically Signed   By: Bretta Bang III M.D.   On: 08/22/2016 20:38    Procedures Procedures (including critical care time)  Medications Ordered in ED Medications  lactulose (CHRONULAC) 10 GM/15ML solution 30 g (not administered)  dicyclomine (BENTYL) capsule 10 mg (not administered)  ketorolac (TORADOL) 30 MG/ML injection 30 mg (not administered)  furosemide (LASIX) injection 40 mg (40 mg Intravenous Given 08/22/16 2127)  morphine 4 MG/ML injection 4 mg (4 mg Intravenous Given 08/22/16 2127)  ondansetron (ZOFRAN) injection 4 mg (4 mg Intravenous Given 08/22/16 2127)  potassium chloride SA (K-DUR,KLOR-CON) CR tablet 40 mEq (40 mEq Oral Given 08/22/16 2127)  Initial Impression /  Assessment and Plan / ED Course  I have reviewed the triage vital signs and the nursing notes.  Pertinent labs & imaging results that were available during my care of the patient were reviewed by me and considered in my medical decision making (see chart for details).  Clinical Course    I think most of pt's sx are due to narcotic withdrawal.  I told her that she would need to get those rx from her dr.  She is also constipated, so she is given a dose of lactulose.  She already takes miralax bid, so she is also given a rx for lactulose.  Her potassium is low despite taking 40 mg daily, so I increased that for 2 weeks.  She knows to return if worse.  Final Clinical Impressions(s) / ED Diagnoses   Final diagnoses:  Hypokalemia  Narcotic withdrawal (HCC)  Constipation, unspecified constipation type  Peripheral edema    New Prescriptions New Prescriptions   LACTULOSE (CHRONULAC) 10 GM/15ML SOLUTION    Take 30 mLs (20 g total) by mouth 2 (two) times daily as needed for severe constipation.     Jacalyn Lefevre, MD 08/22/16 [phone]

## 2016-08-22 NOTE — ED Triage Notes (Signed)
Pt c/o chest pain for a few days, and generalized body aches. C/o feet swelling. Pt is moaning in pain, but answers questions when asked. Hx of HTN, diabetes.

## 2016-08-23 NOTE — ED Notes (Signed)
Pt departed in NAD, escorted to ED Waiting area by this RN, where she wanted to wait for her granddaughter to pick her up.

## 2016-10-08 IMAGING — DX DG ABDOMEN 1V
1 series · 1 of 1 positions shown · non-contrast
Comparison: CT abdomen and pelvis 07/01/2016

CLINICAL DATA: Lower abdominal pain and distention for a few days.
History of hypertension and diabetes.

EXAM:
ABDOMEN - 1 VIEW

[abdomen kub]
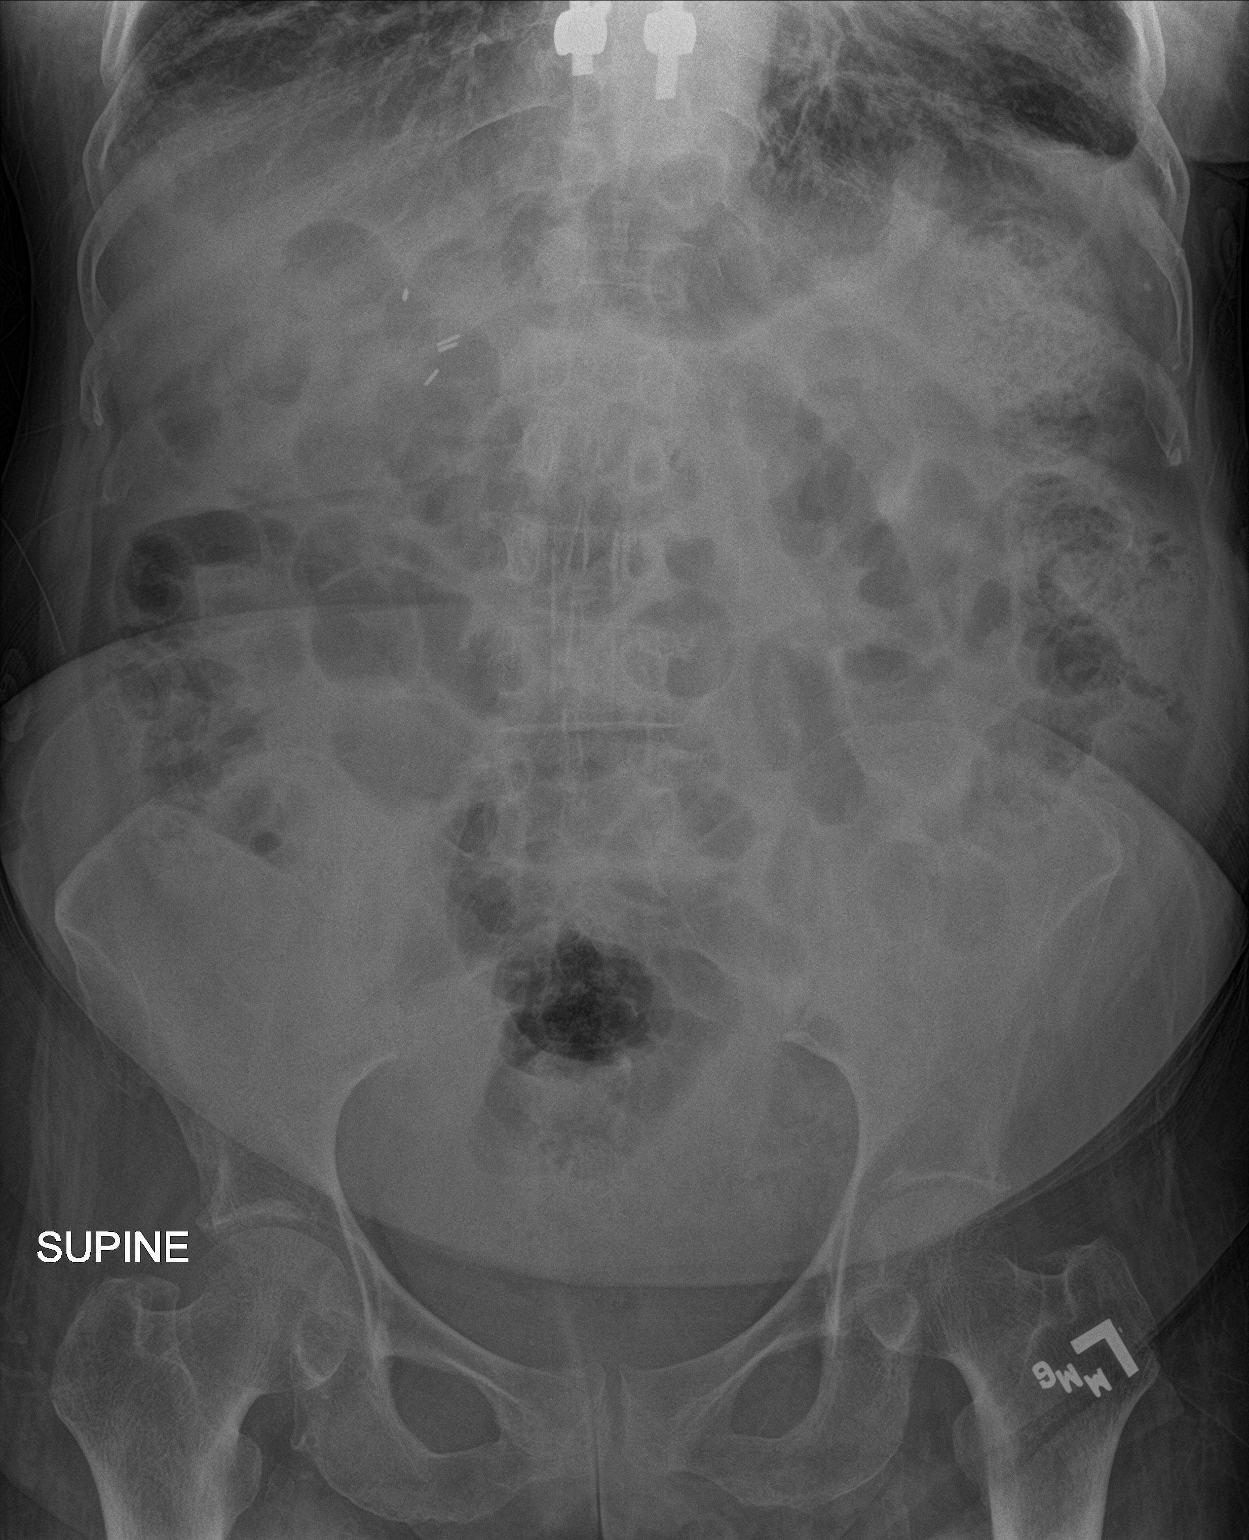

[1 of 1 positions shown; findings below may reference images not displayed]

FINDINGS: Stool-filled colon. Scattered gas-filled small and large bowel
without distention. Changes likely to represent ileus and
constipation. No radiopaque stones. Degenerative changes in the
spine. Surgical clips in the right upper quadrant.
IMPRESSION: Gas-filled nondistended small bowel with stool-filled colon likely
representing ileus and constipation.

## 2016-10-08 IMAGING — CR DG CHEST 2V
2 series · 2 of 2 positions shown · non-contrast
Comparison: May 25, 2016

CLINICAL DATA: Chest pain with shortness of breath

EXAM:
CHEST  2 VIEW

[chest lat]
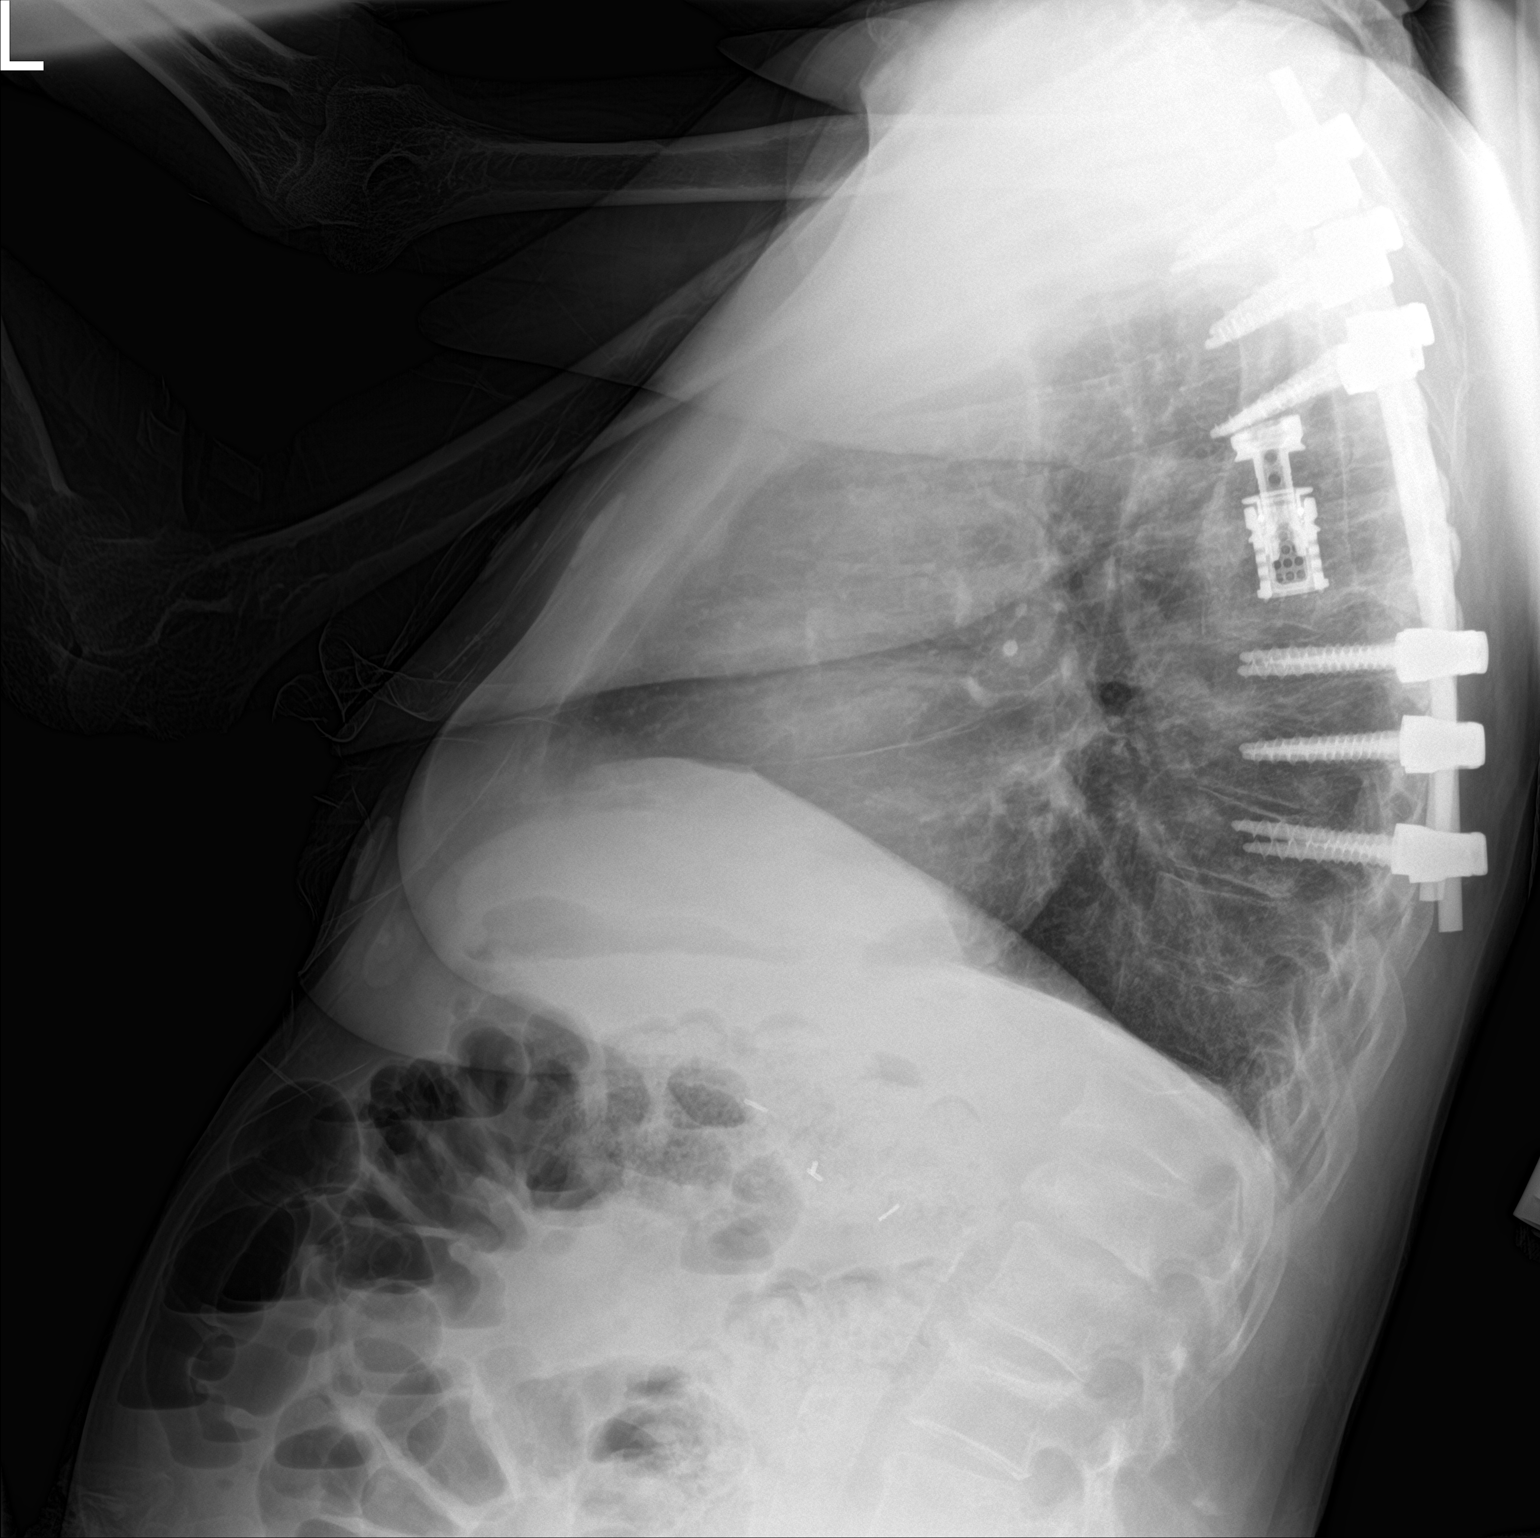

[chest ap]
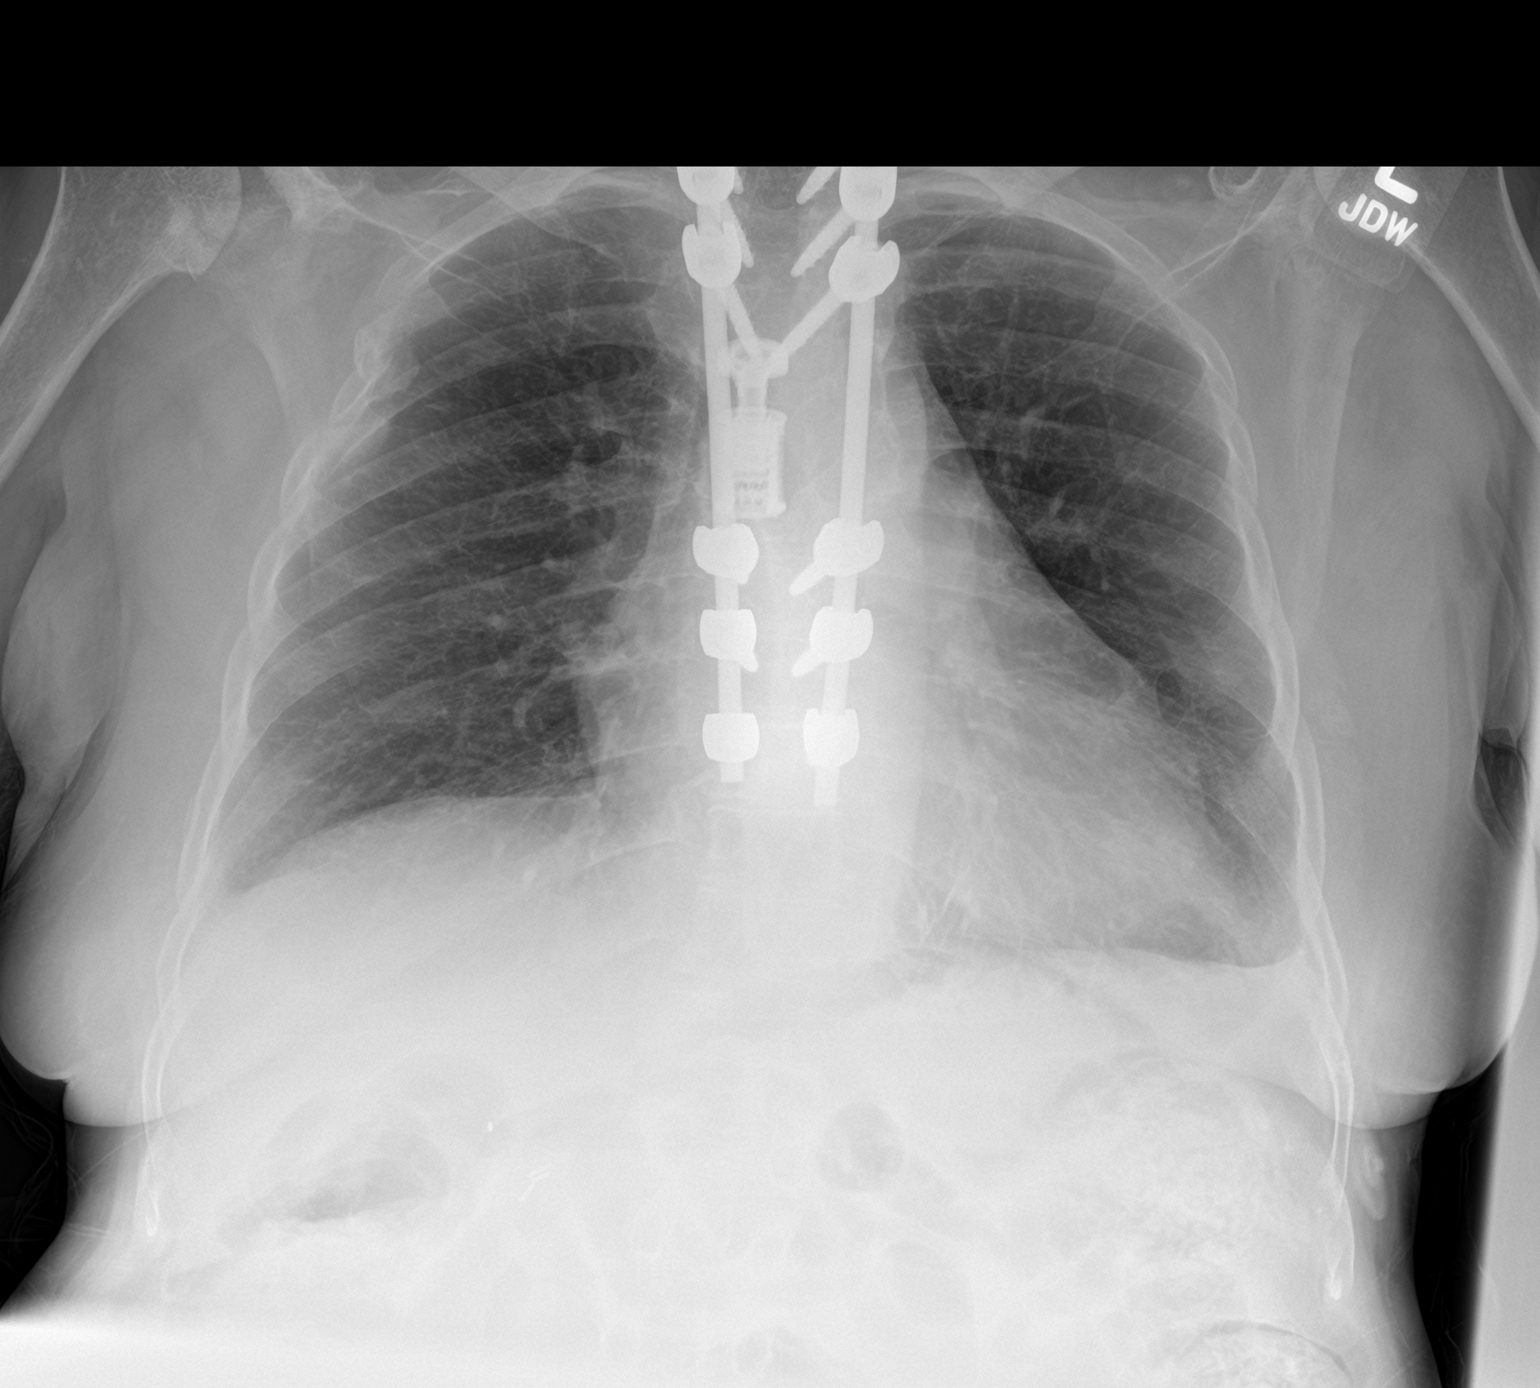

[2 of 2 positions shown; findings below may reference images not displayed]

FINDINGS: There is mild scarring in the left base region. There is no edema or
consolidation. Heart is upper normal in size with pulmonary
vascularity within normal limits. No adenopathy. There is extensive
postoperative change in the thoracic spine region, grossly stable.
Atherosclerotic calcification is noted in the abdominal aorta.
IMPRESSION: Slight scarring left base. No edema or consolidation. There is
abdominal aortic atherosclerosis.

## 2016-10-25 ENCOUNTER — Inpatient Hospital Stay (HOSPITAL_COMMUNITY)
Admission: EM | Admit: 2016-10-25 | Discharge: 2016-10-31 | DRG: 065 | Disposition: A | Discharge status: Skilled Nursing Facility | Payer: Medicaid Other | Attending: Internal Medicine | Admitting: Internal Medicine

## 2016-10-25 DIAGNOSIS — W1830XA Fall on same level, unspecified, initial encounter: Secondary | ICD-10-CM | POA: Diagnosis present

## 2016-10-25 DIAGNOSIS — I119 Hypertensive heart disease without heart failure: Secondary | ICD-10-CM | POA: Diagnosis present

## 2016-10-25 DIAGNOSIS — Z79891 Long term (current) use of opiate analgesic: Secondary | ICD-10-CM

## 2016-10-25 DIAGNOSIS — R0602 Shortness of breath: Secondary | ICD-10-CM

## 2016-10-25 DIAGNOSIS — Z87891 Personal history of nicotine dependence: Secondary | ICD-10-CM

## 2016-10-25 DIAGNOSIS — I272 Pulmonary hypertension, unspecified: Secondary | ICD-10-CM | POA: Diagnosis present

## 2016-10-25 DIAGNOSIS — F419 Anxiety disorder, unspecified: Secondary | ICD-10-CM | POA: Diagnosis present

## 2016-10-25 DIAGNOSIS — M25511 Pain in right shoulder: Secondary | ICD-10-CM

## 2016-10-25 DIAGNOSIS — I959 Hypotension, unspecified: Secondary | ICD-10-CM | POA: Diagnosis present

## 2016-10-25 DIAGNOSIS — Y92009 Unspecified place in unspecified non-institutional (private) residence as the place of occurrence of the external cause: Secondary | ICD-10-CM

## 2016-10-25 DIAGNOSIS — W19XXXA Unspecified fall, initial encounter: Secondary | ICD-10-CM | POA: Diagnosis present

## 2016-10-25 DIAGNOSIS — K219 Gastro-esophageal reflux disease without esophagitis: Secondary | ICD-10-CM | POA: Diagnosis present

## 2016-10-25 DIAGNOSIS — R000 Tachycardia, unspecified: Secondary | ICD-10-CM | POA: Diagnosis present

## 2016-10-25 DIAGNOSIS — J45909 Unspecified asthma, uncomplicated: Secondary | ICD-10-CM | POA: Diagnosis present

## 2016-10-25 DIAGNOSIS — Z7984 Long term (current) use of oral hypoglycemic drugs: Secondary | ICD-10-CM

## 2016-10-25 DIAGNOSIS — Z841 Family history of disorders of kidney and ureter: Secondary | ICD-10-CM

## 2016-10-25 DIAGNOSIS — G8929 Other chronic pain: Secondary | ICD-10-CM | POA: Diagnosis present

## 2016-10-25 DIAGNOSIS — R569 Unspecified convulsions: Secondary | ICD-10-CM

## 2016-10-25 DIAGNOSIS — I639 Cerebral infarction, unspecified: Secondary | ICD-10-CM | POA: Diagnosis present

## 2016-10-25 DIAGNOSIS — M069 Rheumatoid arthritis, unspecified: Secondary | ICD-10-CM | POA: Diagnosis present

## 2016-10-25 DIAGNOSIS — J45901 Unspecified asthma with (acute) exacerbation: Secondary | ICD-10-CM | POA: Diagnosis present

## 2016-10-25 DIAGNOSIS — Z79899 Other long term (current) drug therapy: Secondary | ICD-10-CM

## 2016-10-25 DIAGNOSIS — E876 Hypokalemia: Secondary | ICD-10-CM | POA: Diagnosis present

## 2016-10-25 DIAGNOSIS — K5900 Constipation, unspecified: Secondary | ICD-10-CM | POA: Diagnosis present

## 2016-10-25 DIAGNOSIS — D509 Iron deficiency anemia, unspecified: Secondary | ICD-10-CM | POA: Diagnosis present

## 2016-10-25 DIAGNOSIS — Z8249 Family history of ischemic heart disease and other diseases of the circulatory system: Secondary | ICD-10-CM

## 2016-10-25 DIAGNOSIS — S42141A Displaced fracture of glenoid cavity of scapula, right shoulder, initial encounter for closed fracture: Secondary | ICD-10-CM | POA: Diagnosis present

## 2016-10-25 DIAGNOSIS — Z7952 Long term (current) use of systemic steroids: Secondary | ICD-10-CM

## 2016-10-25 DIAGNOSIS — R296 Repeated falls: Secondary | ICD-10-CM | POA: Diagnosis present

## 2016-10-25 DIAGNOSIS — E118 Type 2 diabetes mellitus with unspecified complications: Secondary | ICD-10-CM | POA: Diagnosis present

## 2016-10-25 DIAGNOSIS — I82711 Chronic embolism and thrombosis of superficial veins of right upper extremity: Secondary | ICD-10-CM | POA: Diagnosis present

## 2016-10-25 DIAGNOSIS — I6340 Cerebral infarction due to embolism of unspecified cerebral artery: Secondary | ICD-10-CM

## 2016-10-25 DIAGNOSIS — E119 Type 2 diabetes mellitus without complications: Secondary | ICD-10-CM

## 2016-10-25 DIAGNOSIS — I110 Hypertensive heart disease with heart failure: Secondary | ICD-10-CM | POA: Diagnosis present

## 2016-10-25 DIAGNOSIS — I5032 Chronic diastolic (congestive) heart failure: Secondary | ICD-10-CM | POA: Diagnosis present

## 2016-10-25 DIAGNOSIS — Q211 Atrial septal defect: Secondary | ICD-10-CM

## 2016-10-25 DIAGNOSIS — Z7982 Long term (current) use of aspirin: Secondary | ICD-10-CM

## 2016-10-25 HISTORY — DX: Cerebral infarction, unspecified: I63.9

## 2016-10-26 ENCOUNTER — Observation Stay (HOSPITAL_COMMUNITY): Payer: Medicaid Other

## 2016-10-26 ENCOUNTER — Emergency Department (HOSPITAL_COMMUNITY): Payer: Medicaid Other

## 2016-10-26 ENCOUNTER — Encounter (HOSPITAL_COMMUNITY): Payer: Self-pay | Admitting: Emergency Medicine

## 2016-10-26 DIAGNOSIS — I272 Pulmonary hypertension, unspecified: Secondary | ICD-10-CM | POA: Diagnosis present

## 2016-10-26 DIAGNOSIS — F419 Anxiety disorder, unspecified: Secondary | ICD-10-CM | POA: Diagnosis present

## 2016-10-26 DIAGNOSIS — Z7982 Long term (current) use of aspirin: Secondary | ICD-10-CM | POA: Diagnosis not present

## 2016-10-26 DIAGNOSIS — J4521 Mild intermittent asthma with (acute) exacerbation: Secondary | ICD-10-CM

## 2016-10-26 DIAGNOSIS — K5900 Constipation, unspecified: Secondary | ICD-10-CM | POA: Diagnosis present

## 2016-10-26 DIAGNOSIS — I4891 Unspecified atrial fibrillation: Secondary | ICD-10-CM | POA: Insufficient documentation

## 2016-10-26 DIAGNOSIS — D509 Iron deficiency anemia, unspecified: Secondary | ICD-10-CM | POA: Diagnosis present

## 2016-10-26 DIAGNOSIS — I82711 Chronic embolism and thrombosis of superficial veins of right upper extremity: Secondary | ICD-10-CM | POA: Diagnosis present

## 2016-10-26 DIAGNOSIS — J4531 Mild persistent asthma with (acute) exacerbation: Secondary | ICD-10-CM

## 2016-10-26 DIAGNOSIS — I10 Essential (primary) hypertension: Secondary | ICD-10-CM | POA: Diagnosis not present

## 2016-10-26 DIAGNOSIS — W19XXXA Unspecified fall, initial encounter: Secondary | ICD-10-CM | POA: Diagnosis present

## 2016-10-26 DIAGNOSIS — I6310 Cerebral infarction due to embolism of unspecified precerebral artery: Secondary | ICD-10-CM | POA: Diagnosis not present

## 2016-10-26 DIAGNOSIS — Z7952 Long term (current) use of systemic steroids: Secondary | ICD-10-CM | POA: Diagnosis not present

## 2016-10-26 DIAGNOSIS — S42141A Displaced fracture of glenoid cavity of scapula, right shoulder, initial encounter for closed fracture: Secondary | ICD-10-CM | POA: Diagnosis present

## 2016-10-26 DIAGNOSIS — K219 Gastro-esophageal reflux disease without esophagitis: Secondary | ICD-10-CM | POA: Diagnosis present

## 2016-10-26 DIAGNOSIS — Y92009 Unspecified place in unspecified non-institutional (private) residence as the place of occurrence of the external cause: Secondary | ICD-10-CM | POA: Diagnosis not present

## 2016-10-26 DIAGNOSIS — E118 Type 2 diabetes mellitus with unspecified complications: Secondary | ICD-10-CM

## 2016-10-26 DIAGNOSIS — I6340 Cerebral infarction due to embolism of unspecified cerebral artery: Secondary | ICD-10-CM | POA: Diagnosis present

## 2016-10-26 DIAGNOSIS — W1830XA Fall on same level, unspecified, initial encounter: Secondary | ICD-10-CM | POA: Diagnosis present

## 2016-10-26 DIAGNOSIS — E1121 Type 2 diabetes mellitus with diabetic nephropathy: Secondary | ICD-10-CM | POA: Diagnosis not present

## 2016-10-26 DIAGNOSIS — I639 Cerebral infarction, unspecified: Secondary | ICD-10-CM | POA: Diagnosis present

## 2016-10-26 DIAGNOSIS — I63119 Cerebral infarction due to embolism of unspecified vertebral artery: Secondary | ICD-10-CM | POA: Diagnosis not present

## 2016-10-26 DIAGNOSIS — Z7984 Long term (current) use of oral hypoglycemic drugs: Secondary | ICD-10-CM | POA: Diagnosis not present

## 2016-10-26 DIAGNOSIS — E876 Hypokalemia: Secondary | ICD-10-CM | POA: Diagnosis present

## 2016-10-26 DIAGNOSIS — Q211 Atrial septal defect: Secondary | ICD-10-CM | POA: Diagnosis not present

## 2016-10-26 DIAGNOSIS — R000 Tachycardia, unspecified: Secondary | ICD-10-CM | POA: Diagnosis present

## 2016-10-26 DIAGNOSIS — M25511 Pain in right shoulder: Secondary | ICD-10-CM | POA: Diagnosis not present

## 2016-10-26 DIAGNOSIS — G8929 Other chronic pain: Secondary | ICD-10-CM | POA: Diagnosis present

## 2016-10-26 DIAGNOSIS — M79609 Pain in unspecified limb: Secondary | ICD-10-CM | POA: Diagnosis not present

## 2016-10-26 DIAGNOSIS — I6789 Other cerebrovascular disease: Secondary | ICD-10-CM | POA: Diagnosis not present

## 2016-10-26 DIAGNOSIS — Z79891 Long term (current) use of opiate analgesic: Secondary | ICD-10-CM | POA: Diagnosis not present

## 2016-10-26 DIAGNOSIS — R569 Unspecified convulsions: Secondary | ICD-10-CM | POA: Diagnosis present

## 2016-10-26 DIAGNOSIS — I63131 Cerebral infarction due to embolism of right carotid artery: Secondary | ICD-10-CM | POA: Diagnosis not present

## 2016-10-26 DIAGNOSIS — J45909 Unspecified asthma, uncomplicated: Secondary | ICD-10-CM | POA: Diagnosis present

## 2016-10-26 DIAGNOSIS — R296 Repeated falls: Secondary | ICD-10-CM | POA: Diagnosis present

## 2016-10-26 DIAGNOSIS — I5032 Chronic diastolic (congestive) heart failure: Secondary | ICD-10-CM | POA: Diagnosis present

## 2016-10-26 DIAGNOSIS — M069 Rheumatoid arthritis, unspecified: Secondary | ICD-10-CM | POA: Diagnosis present

## 2016-10-26 DIAGNOSIS — I361 Nonrheumatic tricuspid (valve) insufficiency: Secondary | ICD-10-CM | POA: Diagnosis not present

## 2016-10-26 DIAGNOSIS — I110 Hypertensive heart disease with heart failure: Secondary | ICD-10-CM | POA: Diagnosis present

## 2016-10-26 DIAGNOSIS — I959 Hypotension, unspecified: Secondary | ICD-10-CM | POA: Diagnosis present

## 2016-10-26 HISTORY — DX: Cerebral infarction, unspecified: I63.9

## 2016-10-26 LAB — CBC WITH DIFFERENTIAL/PLATELET
BASOS PCT: 0 %
Basophils Absolute: 0 10*3/uL (ref 0.0–0.1)
EOS ABS: 0.1 10*3/uL (ref 0.0–0.7)
Eosinophils Relative: 1 %
HCT: 31.6 % — ABNORMAL LOW (ref 36.0–46.0)
HEMOGLOBIN: 10.4 g/dL — AB (ref 12.0–15.0)
Lymphocytes Relative: 36 %
Lymphs Abs: 3.5 10*3/uL (ref 0.7–4.0)
MCH: 25.6 pg — ABNORMAL LOW (ref 26.0–34.0)
MCHC: 32.9 g/dL (ref 30.0–36.0)
MCV: 77.8 fL — ABNORMAL LOW (ref 78.0–100.0)
Monocytes Absolute: 0.7 10*3/uL (ref 0.1–1.0)
Monocytes Relative: 8 %
NEUTROS PCT: 55 %
Neutro Abs: 5.5 10*3/uL (ref 1.7–7.7)
PLATELETS: 203 10*3/uL (ref 150–400)
RBC: 4.06 MIL/uL (ref 3.87–5.11)
RDW: 16.4 % — ABNORMAL HIGH (ref 11.5–15.5)
WBC: 9.8 10*3/uL (ref 4.0–10.5)

## 2016-10-26 LAB — COMPREHENSIVE METABOLIC PANEL
ALK PHOS: 121 U/L (ref 38–126)
ALT: 29 U/L (ref 14–54)
AST: 41 U/L (ref 15–41)
Albumin: 2.7 g/dL — ABNORMAL LOW (ref 3.5–5.0)
Anion gap: 9 (ref 5–15)
BILIRUBIN TOTAL: 0.6 mg/dL (ref 0.3–1.2)
BUN: 13 mg/dL (ref 6–20)
CALCIUM: 8.4 mg/dL — AB (ref 8.9–10.3)
CO2: 21 mmol/L — ABNORMAL LOW (ref 22–32)
CREATININE: 1 mg/dL (ref 0.44–1.00)
Chloride: 112 mmol/L — ABNORMAL HIGH (ref 101–111)
Glucose, Bld: 87 mg/dL (ref 65–99)
Potassium: 3.4 mmol/L — ABNORMAL LOW (ref 3.5–5.1)
Sodium: 142 mmol/L (ref 135–145)
TOTAL PROTEIN: 6.1 g/dL — AB (ref 6.5–8.1)

## 2016-10-26 LAB — RAPID URINE DRUG SCREEN, HOSP PERFORMED
Amphetamines: NOT DETECTED
BARBITURATES: NOT DETECTED
Benzodiazepines: POSITIVE — AB
Cocaine: NOT DETECTED
Opiates: POSITIVE — AB
TETRAHYDROCANNABINOL: NOT DETECTED

## 2016-10-26 LAB — GLUCOSE, CAPILLARY: Glucose-Capillary: 350 mg/dL — ABNORMAL HIGH (ref 65–99)

## 2016-10-26 LAB — MRSA PCR SCREENING: MRSA by PCR: NEGATIVE

## 2016-10-26 LAB — CBG MONITORING, ED: Glucose-Capillary: 75 mg/dL (ref 65–99)

## 2016-10-26 MED ORDER — POTASSIUM CHLORIDE CRYS ER 20 MEQ PO TBCR
40.0000 meq | EXTENDED_RELEASE_TABLET | Freq: Once | ORAL | Status: AC
  Administered 2016-10-26: 40 meq via ORAL
  Filled 2016-10-26: qty 2

## 2016-10-26 MED ORDER — MORPHINE SULFATE ER 30 MG PO TBCR: 30.0000 mg | EXTENDED_RELEASE_TABLET | Freq: Two times a day (BID) | ORAL | Status: DC

## 2016-10-26 MED ORDER — ENOXAPARIN SODIUM 40 MG/0.4ML ~~LOC~~ SOLN
40.0000 mg | SUBCUTANEOUS | Status: DC
  Administered 2016-10-26 – 2016-10-30 (×5): 40 mg via SUBCUTANEOUS
  Filled 2016-10-26 (×5): qty 0.4

## 2016-10-26 MED ORDER — OXYCODONE-ACETAMINOPHEN 5-325 MG PO TABS
1.0000 | ORAL_TABLET | ORAL | Status: DC | PRN
  Administered 2016-10-26 – 2016-10-31 (×16): 2 via ORAL
  Filled 2016-10-26 (×16): qty 2

## 2016-10-26 MED ORDER — METFORMIN HCL ER 500 MG PO TB24: 500.0000 mg | ORAL_TABLET | Freq: Every day | ORAL | Status: DC

## 2016-10-26 MED ORDER — SODIUM CHLORIDE 0.9 % IV SOLN
1000.0000 mg | Freq: Once | INTRAVENOUS | Status: AC
  Administered 2016-10-26: 1000 mg via INTRAVENOUS
  Filled 2016-10-26: qty 10

## 2016-10-26 MED ORDER — MORPHINE SULFATE ER 30 MG PO TBCR
60.0000 mg | EXTENDED_RELEASE_TABLET | Freq: Two times a day (BID) | ORAL | Status: DC
  Administered 2016-10-26: 60 mg via ORAL
  Filled 2016-10-26: qty 2

## 2016-10-26 MED ORDER — STROKE: EARLY STAGES OF RECOVERY BOOK
Freq: Once | Status: DC
  Filled 2016-10-26: qty 1

## 2016-10-26 MED ORDER — GABAPENTIN 300 MG PO CAPS
300.0000 mg | ORAL_CAPSULE | Freq: Two times a day (BID) | ORAL | Status: DC
  Administered 2016-10-26 – 2016-10-31 (×11): 300 mg via ORAL
  Filled 2016-10-26 (×12): qty 1

## 2016-10-26 MED ORDER — ACETAMINOPHEN 325 MG PO TABS
650.0000 mg | ORAL_TABLET | Freq: Once | ORAL | Status: AC
  Administered 2016-10-26: 650 mg via ORAL
  Filled 2016-10-26: qty 2

## 2016-10-26 MED ORDER — ATORVASTATIN CALCIUM 10 MG PO TABS
10.0000 mg | ORAL_TABLET | Freq: Every day | ORAL | Status: DC
  Administered 2016-10-26 – 2016-10-31 (×6): 10 mg via ORAL
  Filled 2016-10-26 (×7): qty 1

## 2016-10-26 MED ORDER — OXYCODONE-ACETAMINOPHEN 5-325 MG PO TABS
2.0000 | ORAL_TABLET | Freq: Four times a day (QID) | ORAL | Status: DC | PRN
Start: 2016-10-26 — End: 2016-10-26
  Administered 2016-10-26: 2 via ORAL
  Filled 2016-10-26: qty 2

## 2016-10-26 MED ORDER — INSULIN ASPART 100 UNIT/ML ~~LOC~~ SOLN
0.0000 [IU] | Freq: Three times a day (TID) | SUBCUTANEOUS | Status: DC
  Administered 2016-10-27: 2 [IU] via SUBCUTANEOUS
  Administered 2016-10-28: 1 [IU] via SUBCUTANEOUS
  Administered 2016-10-28: 7 [IU] via SUBCUTANEOUS
  Administered 2016-10-29: 2 [IU] via SUBCUTANEOUS
  Administered 2016-10-29: 1 [IU] via SUBCUTANEOUS
  Administered 2016-10-30: 3 [IU] via SUBCUTANEOUS
  Administered 2016-10-30: 2 [IU] via SUBCUTANEOUS
  Administered 2016-10-31: 1 [IU] via SUBCUTANEOUS
  Administered 2016-10-31: 2 [IU] via SUBCUTANEOUS

## 2016-10-26 MED ORDER — FUROSEMIDE 20 MG PO TABS: 40.0000 mg | ORAL_TABLET | Freq: Every day | ORAL | Status: DC

## 2016-10-26 MED ORDER — MORPHINE SULFATE (PF) 4 MG/ML IV SOLN
4.0000 mg | Freq: Once | INTRAVENOUS | Status: AC
  Administered 2016-10-26: 4 mg via INTRAVENOUS
  Filled 2016-10-26: qty 1

## 2016-10-26 MED ORDER — POLYETHYLENE GLYCOL 3350 17 G PO PACK
17.0000 g | PACK | Freq: Two times a day (BID) | ORAL | Status: DC
  Administered 2016-10-27 – 2016-10-31 (×5): 17 g via ORAL
  Filled 2016-10-26 (×10): qty 1

## 2016-10-26 MED ORDER — INSULIN ASPART 100 UNIT/ML ~~LOC~~ SOLN
0.0000 [IU] | Freq: Every day | SUBCUTANEOUS | Status: DC
  Administered 2016-10-26: 4 [IU] via SUBCUTANEOUS

## 2016-10-26 MED ORDER — POTASSIUM CHLORIDE CRYS ER 20 MEQ PO TBCR: 40.0000 meq | EXTENDED_RELEASE_TABLET | Freq: Two times a day (BID) | ORAL | Status: DC

## 2016-10-26 MED ORDER — DIAZEPAM 5 MG PO TABS
5.0000 mg | ORAL_TABLET | Freq: Two times a day (BID) | ORAL | Status: DC
  Administered 2016-10-26 – 2016-10-31 (×10): 5 mg via ORAL
  Filled 2016-10-26 (×10): qty 1

## 2016-10-26 MED ORDER — ASPIRIN EC 325 MG PO TBEC: 325.0000 mg | DELAYED_RELEASE_TABLET | Freq: Every day | ORAL | Status: DC

## 2016-10-26 MED ORDER — ASPIRIN EC 325 MG PO TBEC
325.0000 mg | DELAYED_RELEASE_TABLET | Freq: Every day | ORAL | Status: DC
  Administered 2016-10-27 – 2016-10-31 (×5): 325 mg via ORAL
  Filled 2016-10-26 (×5): qty 1

## 2016-10-26 MED ORDER — METOPROLOL TARTRATE 5 MG/5ML IV SOLN
5.0000 mg | INTRAVENOUS | Status: DC | PRN
  Administered 2016-10-26: 5 mg via INTRAVENOUS
  Filled 2016-10-26: qty 5

## 2016-10-26 MED ORDER — GADOBENATE DIMEGLUMINE 529 MG/ML IV SOLN
15.0000 mL | Freq: Once | INTRAVENOUS | Status: AC
  Administered 2016-10-26: 13 mL via INTRAVENOUS

## 2016-10-26 MED ORDER — ASPIRIN EC 81 MG PO TBEC
81.0000 mg | DELAYED_RELEASE_TABLET | Freq: Every day | ORAL | Status: DC
Start: 2016-10-26 — End: 2016-10-26
  Filled 2016-10-26: qty 1

## 2016-10-26 MED ORDER — LEVETIRACETAM 500 MG PO TABS
500.0000 mg | ORAL_TABLET | Freq: Two times a day (BID) | ORAL | Status: DC
  Administered 2016-10-26 – 2016-10-27 (×2): 500 mg via ORAL
  Filled 2016-10-26 (×4): qty 1

## 2016-10-26 MED ORDER — PANTOPRAZOLE SODIUM 40 MG PO TBEC
40.0000 mg | DELAYED_RELEASE_TABLET | Freq: Two times a day (BID) | ORAL | Status: DC
  Administered 2016-10-27 – 2016-10-31 (×10): 40 mg via ORAL
  Filled 2016-10-26 (×11): qty 1

## 2016-10-26 MED ORDER — ENOXAPARIN SODIUM 80 MG/0.8ML ~~LOC~~ SOLN
1.0000 mg/kg | Freq: Two times a day (BID) | SUBCUTANEOUS | Status: DC
  Filled 2016-10-26: qty 0.8

## 2016-10-26 MED ORDER — PREDNISONE 5 MG PO TABS
15.0000 mg | ORAL_TABLET | Freq: Every day | ORAL | Status: DC
  Administered 2016-10-26 – 2016-10-31 (×6): 15 mg via ORAL
  Filled 2016-10-26: qty 3
  Filled 2016-10-26: qty 2
  Filled 2016-10-26: qty 3
  Filled 2016-10-26: qty 2
  Filled 2016-10-26 (×2): qty 1
  Filled 2016-10-26: qty 3

## 2016-10-26 MED ORDER — MORPHINE SULFATE ER 15 MG PO TBCR
30.0000 mg | EXTENDED_RELEASE_TABLET | Freq: Two times a day (BID) | ORAL | Status: DC
  Administered 2016-10-26 – 2016-10-31 (×10): 30 mg via ORAL
  Filled 2016-10-26 (×10): qty 2

## 2016-10-26 MED ORDER — ENSURE ENLIVE PO LIQD
237.0000 mL | Freq: Two times a day (BID) | ORAL | Status: DC
  Administered 2016-10-26 – 2016-10-31 (×7): 237 mL via ORAL
  Filled 2016-10-26: qty 237

## 2016-10-26 MED ORDER — METOPROLOL SUCCINATE ER 25 MG PO TB24: 25.0000 mg | ORAL_TABLET | Freq: Every day | ORAL | Status: DC

## 2016-10-26 MED ORDER — ENOXAPARIN SODIUM 40 MG/0.4ML ~~LOC~~ SOLN
40.0000 mg | SUBCUTANEOUS | Status: DC
  Filled 2016-10-26 (×2): qty 0.4

## 2016-10-26 NOTE — ED Notes (Signed)
Patient transported to eeg

## 2016-10-26 NOTE — H&P (Signed)
History and Physical  Patient Name: Amber [name]     MMH:[id number]    DOB: 29-May-1956    DOA: 10/25/2016 PCP: [name], MD   Patient coming from: Home  Chief Complaint: Arm shaking  HPI: Amber [name] is a 60 y.o. female with a past medical history significant for HTN, NIDDM, HFpEF, seizures, chronic pain, and asthma on chronic prednisone who presents with arm shaking today.  The patient was in her usual health until today she woke (from a nap?) with new left arm shaking/tremor.  This persisted through the day, so she had her family member call 9-1-1.  At no point did she lose consciousness, have generalized shaking, fall.    ED course: -Afebrile, heart rate 95, respirations and pulse ox normal, BP 123/95 -Na 142, K 3.4, Cr 1.0, WBC 9.8K, Hgb 10.4 (at baseline) -CT head showed a possible R frontal lobe infarct and the patient was loaded with Keppra -Neuro were consulted who found the shaking distractible and of changing amplitude, and also noted the patient has new stress of the loss of her son; they suspected pseudoseizures, but recommended EEG this morning and TRH were asked to arrange   The patient endorses a remote history of seizures, but is on no medications currently (old medication lists include phenytoin; there are no Neurology notes in her chart).        ROS: Review of Systems  Neurological: Positive for tremors and seizures (i.e. arm shaking). Negative for dizziness, tingling, sensory change, speech change, focal weakness, loss of consciousness and headaches.  All other systems reviewed and are negative.         Past Medical History:  Diagnosis Date  . Arthritis   . Asthma   . Chronic pain   . Constipation   . Diabetes mellitus without complication (HCC)   . Falls frequently   . GERD (gastroesophageal reflux disease)   . Hypertension   . Pneumonia 10/2015  . Seizures (HCC)    last seizure March 2015    Past Surgical History:  Procedure Laterality Date    . APPENDECTOMY    . BACK SURGERY    . CHOLECYSTECTOMY      Social History: Patient lives with her daughter.  The patient walks unassisted.  She is a former smoker.    Allergies  Allergen Reactions  . Ivp Dye [Iodinated Diagnostic Agents] Itching    Family history: family history includes Hypertension in her brother and sister; Kidney failure in her mother.  Prior to Admission medications   Medication Sig Start Date End Date Taking? Authorizing Provider  albuterol (PROVENTIL HFA;VENTOLIN HFA) 108 (90 BASE) MCG/ACT inhaler Inhale 2 puffs into the lungs every 6 (six) hours as needed for wheezing or shortness of breath.    Historical Provider, MD  amLODipine (NORVASC) 5 MG tablet Take 5 mg by mouth daily.    Historical Provider, MD  aspirin EC 81 MG tablet Take 1 tablet (81 mg total) by mouth daily. 06/14/15   Richarda Overlie, MD  atorvastatin (LIPITOR) 10 MG tablet Take 10 mg by mouth daily. 08/13/15   Historical Provider, MD  diazepam (VALIUM) 5 MG tablet Take 1 tablet (5 mg total) by mouth 2 (two) times daily. 05/04/16   Elson Areas, PA-C  feeding supplement, ENSURE ENLIVE, (ENSURE ENLIVE) LIQD Take 237 mLs by mouth 2 (two) times daily between meals. 11/10/15   Catarina Hartshorn, MD  Fluticasone-Salmeterol (ADVAIR) 250-50 MCG/DOSE AEPB Inhale 1 puff into the lungs 2 (two) times daily.  Historical Provider, MD  folic acid (FOLVITE) 1 MG tablet Take 1 mg by mouth daily.    Historical Provider, MD  furosemide (LASIX) 40 MG tablet Take 1 tablet (40 mg total) by mouth daily. 05/28/16   Penny Pia, MD  gabapentin (NEURONTIN) 300 MG capsule Take 1 capsule (300 mg total) by mouth 2 (two) times daily. 06/14/15   Richarda Overlie, MD  HYDROcodone-acetaminophen (NORCO/VICODIN) 5-325 MG tablet Take 1 tablet by mouth every 4 (four) hours as needed. 07/01/16   Rolland Porter, MD  lactulose (CHRONULAC) 10 GM/15ML solution Take 30 mLs (20 g total) by mouth 2 (two) times daily as needed for severe constipation. 08/22/16    Jacalyn Lefevre, MD  LINZESS 145 MCG CAPS capsule Take 1 capsule (145 mcg total) by mouth daily as needed (constipation). 05/26/16   Penny Pia, MD  metFORMIN (GLUCOPHAGE-XR) 500 MG 24 hr tablet Take 500 mg by mouth daily with breakfast.    Historical Provider, MD  metoprolol succinate (TOPROL-XL) 25 MG 24 hr tablet Take 25 mg by mouth daily.    Historical Provider, MD  oxymorphone 20 MG T12A Take 20 mg by mouth every 12 (twelve) hours. 03/01/16   Richarda Overlie, MD  pantoprazole (PROTONIX) 40 MG tablet Take 1 tablet (40 mg total) by mouth 2 (two) times daily before a meal. 12/25/15   Ripudeep K Rai, MD  polyethylene glycol (MIRALAX / GLYCOLAX) packet Take 17 g by mouth 2 (two) times daily. 05/26/16   Penny Pia, MD  potassium chloride SA (K-DUR,KLOR-CON) 20 MEQ tablet Take 2 tablets (40 mEq total) by mouth 2 (two) times daily. 08/22/16   Jacalyn Lefevre, MD  predniSONE (DELTASONE) 10 MG tablet Take 15 mg by mouth daily with breakfast.    Historical Provider, MD  promethazine (PHENERGAN) 25 MG tablet Take 1 tablet by mouth every 6 (six) hours as needed for nausea or vomiting. nausea 10/06/15   Historical Provider, MD  Vitamin D, Ergocalciferol, (DRISDOL) 50000 UNITS CAPS capsule Take 1 capsule (50,000 Units total) by mouth every 7 (seven) days. Sunday Patient taking differently: Take 50,000 Units by mouth every 7 (seven) days.  01/27/15   Calvert Cantor, MD  VITAMIN E PO Take 1 capsule by mouth daily.    Historical Provider, MD       Physical Exam: BP 131/88   Pulse 106   Temp 98.8 F (37.1 C) (Oral)   Resp 19   Ht 5' (1.524 m)   Wt 64.4 kg (142 lb)   SpO2 99%   BMI 27.73 kg/m  General appearance: Small elderly adult female, alert and in no acute distress.   Eyes: Anicteric, conjunctiva pink, lids and lashes normal. PERRL.    ENT: No nasal deformity, discharge, epistaxis.  Hearing normal. OP moist with posterior exudates.   Neck: No neck masses.  Trachea midline.  No  thyromegaly/tenderness. Lymph: Shoddy cervical lymphadenopathy. Skin: Warm and dry.  No jaundice.  No suspicious rashes or lesions. Cardiac: RRR, nl S1-S2, no murmurs appreciated.  Capillary refill is brisk.  JVP normal.  1+ LE edema.  Radial and DP pulses 2+ and symmetric. Respiratory: Normal respiratory rate and rhythm.  CTAB without rales or wheezes. Abdomen: Abdomen soft.  No TTP. No ascites, distension, hepatosplenomegaly.   MSK: No deformities or effusions.  No cyanosis or clubbing. Neuro: Cranial nerves normal.  Sensation intact to light touch. Speech is fluent.  Muscle strength equal bilaterally.  There is a distractible left arm tremor.    Psych: Sensorium intact  and responding to questions, attention normal.  Behavior appropriate.  Affect normal.  Judgment and insight appear normal.     Labs on Admission:  I have personally reviewed following labs and imaging studies: CBC:  Recent Labs Lab 10/26/16 0010  WBC 9.8  NEUTROABS 5.5  HGB 10.4*  HCT 31.6*  MCV 77.8*  PLT 203   Basic Metabolic Panel:  Recent Labs Lab 10/26/16 0010  NA 142  K 3.4*  CL 112*  CO2 21*  GLUCOSE 87  BUN 13  CREATININE 1.00  CALCIUM 8.4*   GFR: Estimated Creatinine Clearance: 51.4 mL/min (by C-G formula based on SCr of 1 mg/dL).  Liver Function Tests:  Recent Labs Lab 10/26/16 0010  AST 41  ALT 29  ALKPHOS 121  BILITOT 0.6  PROT 6.1*  ALBUMIN 2.7*   No results for input(s): LIPASE, AMYLASE in the last 168 hours. No results for input(s): AMMONIA in the last 168 hours. Coagulation Profile: No results for input(s): INR, PROTIME in the last 168 hours. Cardiac Enzymes: No results for input(s): CKTOTAL, CKMB, CKMBINDEX, TROPONINI in the last 168 hours. BNP (last 3 results) No results for input(s): PROBNP in the last 8760 hours. HbA1C: No results for input(s): HGBA1C in the last 72 hours. CBG: No results for input(s): GLUCAP in the last 168 hours. Lipid Profile: No results for  input(s): CHOL, HDL, LDLCALC, TRIG, CHOLHDL, LDLDIRECT in the last 72 hours. Thyroid Function Tests: No results for input(s): TSH, T4TOTAL, FREET4, T3FREE, THYROIDAB in the last 72 hours. Anemia Panel: No results for input(s): VITAMINB12, FOLATE, FERRITIN, TIBC, IRON, RETICCTPCT in the last 72 hours. Sepsis Labs: Invalid input(s): PROCALCITONIN, LACTICIDVEN No results found for this or any previous visit (from the past 240 hour(s)).       Radiological Exams on Admission: Personally reviewed CT report: Ct Head Wo Contrast  Result Date: 10/26/2016 CLINICAL DATA:  Fall 2 weeks ago. Fall backward after walker malfunction. Focal seizure. EXAM: CT HEAD WITHOUT CONTRAST TECHNIQUE: Contiguous axial images were obtained from the base of the skull through the vertex without intravenous contrast. COMPARISON:  Head CT 02/28/2016 FINDINGS: Brain: Focal cortical hypodensity in the posterior right frontal lobe concerning for subacute ischemia. No intracranial hemorrhage. Background chronic small vessel ischemia is similar. No subdural or extra-axial fluid collection. No hydrocephalus. Vascular: Atherosclerosis of skullbase vasculature without hyperdense vessel or abnormal calcification. Skull: No acute fracture. Stable sclerotic focus in the right frontal bone. Sinuses/Orbits: Paranasal sinuses and mastoid air cells are clear. The visualized orbits are unremarkable. Other: None. IMPRESSION: Findings concerning for subacute ischemia in the posterior right frontal lobe. These results were called by telephone at the time of interpretation on 10/26/2016 at 1:34 am to Dr. Dione Booze , who verbally acknowledged these results. Electronically Signed   By: Rubye Oaks M.D.   On: 10/26/2016 01:34    EKG: Independently reviewed. Rate 85, QTc 396, diffuse TW flattening, unchanged from previous.  An initial ECG here showed artifact from her tremor.    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 Acute Stroke:  This is new.  MRI shows "Numerous supra and infratentorial acute infarcts spanning multiple vascular territories, largest in RIGHT frontoparietal lobes corresponding to CT abnormality. Findings consistent with central embolic phenomena."   -Admit to telemetry -Neuro checks, NIHSS per protocol -Daily  aspirin 81 mg -Permissive hypertension for now -Lipids, hemoglobin A1c -Carotid doppler ordered -Echocardiogram ordered -PT/OT/SLP consultation -Consult to Neurology, appreciate recommendations   2. Tremor:  This is new.  Has a history of seizures.  Seizure doubted per Neurology. -EEG ordered -Continue Keppra, if EEG negative, will discontinue  3. HTN:  -Permissive hypertension for now, hold metoprolol and furosemide/K -Continue statin  4. NIDDM:  -Hold metformin -SSI low dose with meals  5. Chronic pain:  -Continue oxymorphone as formulary alternaitve  6. Asthma: -Continue chronic prednisone  7. Other medications:  -Continue Ensure -Continue PPI -Continue diazepam -Continue Miralax       DVT prophylaxis: Lovenox  Code Status: FULL  Family Communication: None present  Disposition Plan: Anticipate Stroke work up as above and consult to ancillary services.  Expect discharge within 2-3 days. Consults called: Neurology, Dr. Otelia Limes has seen patient. Admission status: Telemetry, OBS status  Core measures: -VTE prophylaxis ordered at time of admission -Aspirin ordered at admission -Atrial fibrillation: not present/known -tPA not given because of outside stroke window -Dysphagia screen ordered in ER -Lipids ordered -PT eval ordered -Nonsmoker    Medical decision making: Patient seen at 3:41 AM on 10/26/2016.  The patient was discussed with Dr. Preston Fleeting and Dr. Otelia Limes.  What exists of the patient's chart  was reviewed in depth and summarized above.  Clinical condition: stable.             Alberteen Sam [facility] Pager [phone]

## 2016-10-26 NOTE — ED Notes (Signed)
Meal tray has been ordered for patient.

## 2016-10-26 NOTE — ED Notes (Signed)
Assisted patient with feeding.

## 2016-10-26 NOTE — Progress Notes (Signed)
EEG Completed; Results Pending  

## 2016-10-26 NOTE — ED Notes (Signed)
Pt was able to eat approx. 25% of her lunch tray.

## 2016-10-26 NOTE — Progress Notes (Signed)
Rec'd pt as new admission from Ed. Per daughter, Suella Broad, the patient fell 2 days ago. She believes this precipitated pts tremors/acute infarct. This was not provided in hand off report.

## 2016-10-26 NOTE — ED Notes (Signed)
Patient transported to CT 

## 2016-10-26 NOTE — ED Triage Notes (Signed)
Pt presents by EMS to the ED for tremors. The pt fell on Thanksgiving and hit her head. She states she lost consciousness. Did not want to come to the hospital then and has a headache and dizziness since the fall. Around 0300 on 10/25/16 she woke up having tremors on her left arm. Pt called EMS tonight since she had not gotten better. Vitals are stable

## 2016-10-26 NOTE — Progress Notes (Signed)
PT Cancellation Note  Patient Details Name: Amber [name] MRN: [id number] DOB: 28-Nov-1955   Cancelled Treatment:    Reason Eval/Treat Not Completed: Patient at procedure or test/unavailable (Pt in EEG. Will check back as able.  thanks.  )   Amadeo Garnet [name] 10/26/2016, 11:07 AM [name] [phone] (pager)

## 2016-10-26 NOTE — ED Notes (Signed)
Patient transported to MRI 

## 2016-10-26 NOTE — ED Notes (Signed)
Patient moved to room 48 awaiting inpatient bed . Sleeping at present.

## 2016-10-26 NOTE — Procedures (Signed)
ELECTROENCEPHALOGRAM REPORT  Date of Study: 10/26/2016  Patient's Name: Amber [name] MRN: [id number] Date of Birth: 06/19/1956  Referring Provider: Joen Laura, MD  Clinical History: 60 year old woman with multiple embolic strokes and left upper extremity tremor.  Medications: aspirin EC tablet 81 mg  atorvastatin (LIPITOR) tablet 10 mg  diazepam (VALIUM) tablet 5 mg  enoxaparin (LOVENOX) injection 40 mg  feeding supplement (ENSURE ENLIVE) (ENSURE ENLIVE) liquid 237 mL  gabapentin (NEURONTIN) capsule 300 mg  insulin aspart (novoLOG) injection 0-5 Units  insulin aspart (novoLOG) injection 0-9 Units  levETIRAcetam (KEPPRA) tablet 500 mg  morphine (MS CONTIN) 12 hr tablet 60 mg  pantoprazole (PROTONIX) EC tablet 40 mg  polyethylene glycol (MIRALAX / GLYCOLAX) packet 17 g  predniSONE (DELTASONE) tablet 15 mg   Technical Summary: A multichannel digital EEG recording measured by the international 10-20 system with electrodes applied with paste and impedances below 5000 ohms performed as portable with EKG monitoring in a mostly drowsy and asleep patient.  Hyperventilation and photic stimulation were not performed.  The digital EEG was referentially recorded, reformatted, and digitally filtered in a variety of bipolar and referential montages for optimal display.   Description: The patient is mostly drowsy and asleep during the recording.  During maximal wakefulness, there is a symmetric, medium voltage 8 Hz posterior dominant rhythm that attenuates with eye opening. This is admixed with diffuse 4-5 Hz theta slowing of the waking background.  During drowsiness and sleep, there is an increase in theta slowing of the background.  Vertex waves and symmetric sleep spindles were seen.  There were no epileptiform discharges or electrographic seizures seen.    EKG lead was unremarkable.  Impression: This awake and asleep EEG is mildly abnormal due to mild diffuse slowing of the waking  background.  Clinical Correlation of the above findings indicates diffuse cerebral dysfunction that is non-specific in etiology and can be seen with hypoxic/ischemic injury, toxic/metabolic encephalopathies, neurodegenerative disorders, or medication effect.  The absence of epileptiform discharges does not rule out a clinical diagnosis of epilepsy.  Clinical correlation is advised.   Shon Millet, DO

## 2016-10-26 NOTE — Care Management Note (Signed)
Case Management Note  Patient Details  Name: Amber [name] MRN: [id number] Date of Birth: 1956/01/26  Subjective/Objective:                  60 y.o. female with a past medical history significant for HTN, NIDDM, HFpEF, seizures, chronic pain, and asthma on chronic prednisone who presents with arm shaking today. From home.   Action/Plan: Follow for disposition needs.   Expected Discharge Date:  10/28/16               Expected Discharge Plan:  Home/Self Care  In-House Referral:  NA  Discharge planning Services  NA    Status of Service:  In process, will continue to follow    Additional Comments:  Oletta Cohn, RN 10/26/2016, 10:26 AM

## 2016-10-26 NOTE — Progress Notes (Addendum)
Patient seen and examined  Amber [name] is a 60 y.o. female with a past medical history significant for HTN, NIDDM, HFpEF, seizures, chronic pain, and asthma on chronic prednisone who presents with arm shaking today. MRI consistent with embolic CVA, admitted for completion of workup.Dr. Preston Fleeting observed the intermittent jerking movements of her left arm and felt that they most likely represented partial seizure activity. She was loaded with 1000 mg Keppra IV.  MRI shows   embolic CVA per neurology,   Also complaining of right shoulder pain, x ray  Deformity of glenoid rim most pronounced superiorly may represent an impacted fracture, orthopedic consult , per Dr Roda Shutters ok to proceed with surgery if needed   Need to stabilize BP and HR prior to any intervention  Admit to step down

## 2016-10-26 NOTE — ED Provider Notes (Signed)
[facility]-EMERGENCY DEPT Provider Note   CSN: [id number] Arrival date & time: 10/25/16  2359     History   Chief Complaint Chief Complaint  Patient presents with  . Tremors  . Weakness    HPI Amber [name] is a 60 y.o. female.  She had fallen approximately 2 weeks ago. Since then, she has felt weak and has noted some intermittent shaking of her left upper arm. She denies fever or chills. She denies nausea or vomiting. She is not complaining of a headache.   The history is provided by the patient.    Past Medical History:  Diagnosis Date  . Arthritis   . Asthma   . Chronic pain   . Constipation   . Diabetes mellitus without complication (HCC)   . Falls frequently   . GERD (gastroesophageal reflux disease)   . Hypertension   . Pneumonia 10/2015  . Seizures [facility])    last seizure March 2015    Patient Active Problem List   Diagnosis Date Noted  . Wrist sprain 07/01/2016  . Intractable abdominal pain 05/25/2016  . Sepsis (HCC) 02/29/2016  . Fever 02/28/2016  . Elevated troponin 12/24/2015  . AKI (acute kidney injury) (HCC) 12/24/2015  . Duodenitis   . Fracture of left great toe   . Pulmonary infiltrates   . Chronic pain 11/07/2015  . Nausea with vomiting 11/07/2015  . Abdominal pain 11/07/2015  . Constipation 11/07/2015  . Abdominal distension   . Hypomagnesemia 08/25/2015  . Chest pain, musculoskeletal 08/24/2015  . CAP (community acquired pneumonia)   . Diabetes mellitus type 2, controlled (HCC) 07/27/2015  . Chest pain 07/27/2015  . HCAP (healthcare-associated pneumonia) 07/27/2015  . Chronic back pain   . Frequent falls   . Essential hypertension   . Pulmonary hypertension   . Type 2 diabetes mellitus with complication (HCC)   . Seizures (HCC)   . Gout   . Asthma 06/12/2015  . Acute respiratory failure with hypoxia (HCC) 06/12/2015  . ACS (acute coronary syndrome) (HCC) 06/12/2015  . Asthma exacerbation 06/12/2015  . Acute respiratory failure (HCC)  06/12/2015  . Pain in the chest   . DM type 2 causing complication (HCC) 01/27/2015  . Microcytic anemia 01/26/2015  . Hypokalemia 01/26/2015  . Hypertension 01/26/2015  . History of seizures 01/26/2015  . Hypotension 01/25/2015    Past Surgical History:  Procedure Laterality Date  . APPENDECTOMY    . BACK SURGERY    . CHOLECYSTECTOMY      OB History    No data available       Home Medications    Prior to Admission medications   Medication Sig Start Date End Date Taking? Authorizing Provider  albuterol (PROVENTIL HFA;VENTOLIN HFA) 108 (90 BASE) MCG/ACT inhaler Inhale 2 puffs into the lungs every 6 (six) hours as needed for wheezing or shortness of breath.    Historical Provider, MD  amLODipine (NORVASC) 5 MG tablet Take 5 mg by mouth daily.    Historical Provider, MD  aspirin EC 81 MG tablet Take 1 tablet (81 mg total) by mouth daily. 06/14/15   Richarda Overlie, MD  atorvastatin (LIPITOR) 10 MG tablet Take 10 mg by mouth daily. 08/13/15   Historical Provider, MD  diazepam (VALIUM) 5 MG tablet Take 1 tablet (5 mg total) by mouth 2 (two) times daily. 05/04/16   Elson Areas, PA-C  feeding supplement, ENSURE ENLIVE, (ENSURE ENLIVE) LIQD Take 237 mLs by mouth 2 (two) times daily between meals. 11/10/15  Catarina Hartshorn, MD  Fluticasone-Salmeterol (ADVAIR) 250-50 MCG/DOSE AEPB Inhale 1 puff into the lungs 2 (two) times daily.    Historical Provider, MD  folic acid (FOLVITE) 1 MG tablet Take 1 mg by mouth daily.    Historical Provider, MD  furosemide (LASIX) 40 MG tablet Take 1 tablet (40 mg total) by mouth daily. 05/28/16   Penny Pia, MD  gabapentin (NEURONTIN) 300 MG capsule Take 1 capsule (300 mg total) by mouth 2 (two) times daily. 06/14/15   Richarda Overlie, MD  HYDROcodone-acetaminophen (NORCO/VICODIN) 5-325 MG tablet Take 1 tablet by mouth every 4 (four) hours as needed. 07/01/16   Rolland Porter, MD  lactulose (CHRONULAC) 10 GM/15ML solution Take 30 mLs (20 g total) by mouth 2 (two) times  daily as needed for severe constipation. 08/22/16   Jacalyn Lefevre, MD  LINZESS 145 MCG CAPS capsule Take 1 capsule (145 mcg total) by mouth daily as needed (constipation). 05/26/16   Penny Pia, MD  metFORMIN (GLUCOPHAGE-XR) 500 MG 24 hr tablet Take 500 mg by mouth daily with breakfast.    Historical Provider, MD  metoprolol succinate (TOPROL-XL) 25 MG 24 hr tablet Take 25 mg by mouth daily.    Historical Provider, MD  oxymorphone 20 MG T12A Take 20 mg by mouth every 12 (twelve) hours. 03/01/16   Richarda Overlie, MD  pantoprazole (PROTONIX) 40 MG tablet Take 1 tablet (40 mg total) by mouth 2 (two) times daily before a meal. 12/25/15   Ripudeep K Rai, MD  polyethylene glycol (MIRALAX / GLYCOLAX) packet Take 17 g by mouth 2 (two) times daily. 05/26/16   Penny Pia, MD  potassium chloride SA (K-DUR,KLOR-CON) 20 MEQ tablet Take 2 tablets (40 mEq total) by mouth 2 (two) times daily. 08/22/16   Jacalyn Lefevre, MD  predniSONE (DELTASONE) 10 MG tablet Take 15 mg by mouth daily with breakfast.    Historical Provider, MD  promethazine (PHENERGAN) 25 MG tablet Take 1 tablet by mouth every 6 (six) hours as needed for nausea or vomiting. nausea 10/06/15   Historical Provider, MD  Vitamin D, Ergocalciferol, (DRISDOL) 50000 UNITS CAPS capsule Take 1 capsule (50,000 Units total) by mouth every 7 (seven) days. Sunday Patient taking differently: Take 50,000 Units by mouth every 7 (seven) days.  01/27/15   Calvert Cantor, MD  VITAMIN E PO Take 1 capsule by mouth daily.    Historical Provider, MD    Family History Family History  Problem Relation Age of Onset  . Kidney failure Mother   . Hypertension Sister   . Hypertension Brother     Social History Social History  Substance Use Topics  . Smoking status: Former Games developer  . Smokeless tobacco: Never Used  . Alcohol use No     Comment: admits to 2 drinks/week     Allergies   Ivp dye [iodinated diagnostic agents]   Review of Systems Review of Systems  All other  systems reviewed and are negative.    Physical Exam Updated Vital Signs BP 123/95 (BP Location: Right Arm)   Pulse 80   Temp 98.8 F (37.1 C) (Oral)   Resp 14   SpO2 100%   Physical Exam  Nursing note and vitals reviewed.  60 year old female, resting comfortably and in no acute distress. Vital signs are significant for diastolic hypertension. Oxygen saturation is 100%, which is normal. Head is normocephalic and atraumatic. PERRLA, EOMI. Oropharynx is clear. Neck is nontender and supple without adenopathy or JVD. Back is nontender and there is  no CVA tenderness. Lungs are clear without rales, wheezes, or rhonchi. Chest is nontender. Heart has regular rate and rhythm without murmur. Abdomen is soft, flat, nontender without masses or hepatosplenomegaly and peristalsis is normoactive. Extremities have no cyanosis or edema, full range of motion is present. Skin is warm and dry without rash. Neurologic: Mental status is normal, cranial nerves are intact, focal seizure is present involving the left upper extremity. Arm strength is equal and 5/5. Leg strength is equal at 3/5.  ED Treatments / Results  Labs (all labs ordered are listed, but only abnormal results are displayed) Labs Reviewed  COMPREHENSIVE METABOLIC PANEL - Abnormal; Notable for the following:       Result Value   Potassium 3.4 (*)    Chloride 112 (*)    CO2 21 (*)    Calcium 8.4 (*)    Total Protein 6.1 (*)    Albumin 2.7 (*)    All other components within normal limits  CBC WITH DIFFERENTIAL/PLATELET - Abnormal; Notable for the following:    Hemoglobin 10.4 (*)    HCT 31.6 (*)    MCV 77.8 (*)    MCH 25.6 (*)    RDW 16.4 (*)    All other components within normal limits    EKG  EKG Interpretation  Date/Time:  Wednesday October 25 2016 23:58:33 EST Ventricular Rate:  118 PR Interval:    QRS Duration: 172 QT Interval:  441 QTC Calculation: 611 R Axis:   91 Text Interpretation:  Atrial fibrillation  Nonspecific intraventricular conduction delay Probable lateral infarct, age indeterminate Probable anteroseptal infarct, recent Artifact When compared with ECG of 08/22/2016, prominent artifact makes interpretation difficult, probable new ST elevation Confirmed by [facility]  MD, Nicklaus Alviar ([id number]) on 10/26/2016 12:04:11 AM      ECG 10/26/2016 at 0009 NSR 86/minute, normal axis, normal intervals. Non-specific ST-T changes. When compares with ECG earlier tonight, artifact is no longer present. When compared with ECG of 08/22/2016, no significant change is seen.  Radiology Ct Head Wo Contrast  Result Date: 10/26/2016 CLINICAL DATA:  Fall 2 weeks ago. Fall backward after walker malfunction. Focal seizure. EXAM: CT HEAD WITHOUT CONTRAST TECHNIQUE: Contiguous axial images were obtained from the base of the skull through the vertex without intravenous contrast. COMPARISON:  Head CT 02/28/2016 FINDINGS: Brain: Focal cortical hypodensity in the posterior right frontal lobe concerning for subacute ischemia. No intracranial hemorrhage. Background chronic small vessel ischemia is similar. No subdural or extra-axial fluid collection. No hydrocephalus. Vascular: Atherosclerosis of skullbase vasculature without hyperdense vessel or abnormal calcification. Skull: No acute fracture. Stable sclerotic focus in the right frontal bone. Sinuses/Orbits: Paranasal sinuses and mastoid air cells are clear. The visualized orbits are unremarkable. Other: None. IMPRESSION: Findings concerning for subacute ischemia in the posterior right frontal lobe. These results were called by telephone at the time of interpretation on 10/26/2016 at 1:34 am to Dr. Dione Booze , who verbally acknowledged these results. Electronically Signed   By: Rubye Oaks M.D.   On: 10/26/2016 01:34   Mr Brain Wo Contrast  Result Date: 10/26/2016 CLINICAL DATA:  Tremor, fell on Thanksgiving with head injury. Headache and dizziness. History of diabetes, seizures,  hypertension. EXAM: MRI HEAD WITHOUT CONTRAST TECHNIQUE: Multiplanar, multiecho pulse sequences of the brain and surrounding structures were obtained without intravenous contrast. COMPARISON:  CT HEAD October 26, 2016 at 0101 hours FINDINGS: BRAIN: Confluent up to 4.2 cm reduced diffusion RIGHT posterior frontal parietal lobe, about the central sulcus. Corresponding low ADC values  and faint T2 hyperintense signal. Numerous sub cm foci of reduced diffusion bilateral frontal, parietal and bilateral occipital lobes. Symmetric reduced diffusion mesial thalamus suggests artery of [place]. Multiple foci of reduced diffusion measure up to 11 mm in the cerebellum with low ADC values. Ventricles and sulci are normal for patient's age. Patchy to confluent supratentorial white matter FLAIR T2 hyperintensities exclusive of the aforementioned abnormalities. No midline shift, mass effect or masses. No abnormal extra-axial fluid collections. VASCULAR: Normal major intracranial vascular flow voids present at skull base. SKULL AND UPPER CERVICAL SPINE: No abnormal sellar expansion. No suspicious calvarial bone marrow signal. Craniocervical junction maintained. Grade 1 C3-4 and C4-5 anterolisthesis. SINUSES/ORBITS: The mastoid air-cells and included paranasal sinuses are well-aerated. The included ocular globes and orbital contents are non-suspicious. OTHER: Patient is edentulous. 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 Findings consistent with central embolic phenomena. Mild to moderate chronic small vessel ischemic disease, advanced for age. Electronically Signed   By: Awilda Metro M.D.   On: 10/26/2016 05:19    Procedures Procedures (including critical care time)  Medications Ordered in ED Medications  furosemide (LASIX) tablet 40 mg (not administered)  potassium chloride SA  (K-DUR,KLOR-CON) CR tablet 40 mEq (not administered)  polyethylene glycol (MIRALAX / GLYCOLAX) packet 17 g (not administered)  diazepam (VALIUM) tablet 5 mg (not administered)  morphine (MS CONTIN) 12 hr tablet 60 mg (not administered)  predniSONE (DELTASONE) tablet 15 mg (not administered)  pantoprazole (PROTONIX) EC tablet 40 mg (not administered)  feeding supplement (ENSURE ENLIVE) (ENSURE ENLIVE) liquid 237 mL (not administered)  atorvastatin (LIPITOR) tablet 10 mg (not administered)  aspirin EC tablet 81 mg (not administered)  gabapentin (NEURONTIN) capsule 300 mg (not administered)  metFORMIN (GLUCOPHAGE-XR) 24 hr tablet 500 mg (not administered)  metoprolol succinate (TOPROL-XL) 24 hr tablet 25 mg (not administered)  enoxaparin (LOVENOX) injection 40 mg (not administered)  levETIRAcetam (KEPPRA) tablet 500 mg (not administered)   stroke: mapping our early stages of recovery book (not administered)  levETIRAcetam (KEPPRA) 1,000 mg in sodium chloride 0.9 % 100 mL IVPB (0 mg Intravenous Stopped 10/26/16 0242)  acetaminophen (TYLENOL) tablet 650 mg (650 mg Oral Given 10/26/16 0156)  potassium chloride SA (K-DUR,KLOR-CON) CR tablet 40 mEq (40 mEq Oral Given 10/26/16 0254)  morphine 4 MG/ML injection 4 mg (4 mg Intravenous Given 10/26/16 0252)  morphine 4 MG/ML injection 4 mg (4 mg Intravenous Given 10/26/16 0415)     Initial Impression / Assessment and Plan / ED Course  I have reviewed the triage vital signs and the nursing notes.  Pertinent labs & imaging results that were available during my care of the patient were reviewed by me and considered in my medical decision making (see chart for details).  Clinical Course    Recent fall with interval development of focal seizure. She'll be sent for CT of head. Initial ECG had marked artifact from her focal seizure. Repeat ECG shows minor nonspecific T-wave changes but no arrhythmias or ST segment changes.   CT scan appears to show area of  subacute ischemia in the right frontal area. This is the area that would be consistent with the observed seizure activity. Case is discussed with Dr. Maryfrances Bunnell of [facility] and also Dr. Otelia Limes of neurology. Dr. 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 I am concerned that these actually represent true seizures. MRI is obtained showing multiple cerebral emboli. She is being  admitted to the medicine service.  Final Clinical Impressions(s) / ED Diagnoses   Final diagnoses:  Focal seizure (HCC)  Microcytic anemia  Hypokalemia    New Prescriptions New Prescriptions   No medications on file     Dione Booze, MD 10/26/16 [phone]

## 2016-10-26 NOTE — Progress Notes (Signed)
ANTICOAGULATION CONSULT NOTE - Initial Consult  Pharmacy Consult for Lovenox Indication: secondary stroke prevention  Allergies  Allergen Reactions  . Ivp Dye [Iodinated Diagnostic Agents] Itching    Patient Measurements: Height: 5' (152.4 cm) Weight: 142 lb (64.4 kg) IBW/kg (Calculated) : 45.5  Vital Signs: BP: 108/85 (12/07 1245) Pulse Rate: 128 (12/07 1230)  Labs:  Recent Labs  10/26/16 0010  HGB 10.4*  HCT 31.6*  PLT 203  CREATININE 1.00    Estimated Creatinine Clearance: 51.4 mL/min (by C-G formula based on SCr of 1 mg/dL).   Medical History: Past Medical History:  Diagnosis Date  . Arthritis   . Asthma   . Chronic pain   . Constipation   . Diabetes mellitus without complication (HCC)   . Falls frequently   . GERD (gastroesophageal reflux disease)   . Hypertension   . Pneumonia 10/2015  . Seizures (HCC)    last seizure March 2015    Medications:   (Not in a hospital admission) Scheduled:  .  stroke: mapping our early stages of recovery book   Does not apply Once  . [START ON 10/27/2016] aspirin EC  325 mg Oral Daily  . atorvastatin  10 mg Oral Daily  . diazepam  5 mg Oral BID  . enoxaparin (LOVENOX) injection  40 mg Subcutaneous Q24H  . feeding supplement (ENSURE ENLIVE)  237 mL Oral BID BM  . gabapentin  300 mg Oral BID  . insulin aspart  0-5 Units Subcutaneous QHS  . insulin aspart  0-9 Units Subcutaneous TID WC  . levETIRAcetam  500 mg Oral BID  . morphine  60 mg Oral Q12H  . pantoprazole  40 mg Oral BID AC  . polyethylene glycol  17 g Oral BID  . predniSONE  15 mg Oral Q breakfast   Infusions:    Assessment: 60yo female with history of HTN, CHF and seizures presents with arm shaking. Pharmacy is consulted to dose lovenox for secondary stroke prevention.   Goal of Therapy:  Monitor platelets by anticoagulation protocol: Yes   Plan:  Lovenox 1mg /kg subcutaneously q12h Monitor s/sx of bleeding  . Arlean Hopping, PharmD, BCPS Clinical  Pharmacist Pager [phone] 10/26/2016,1:42 PM

## 2016-10-26 NOTE — Consult Note (Signed)
NEURO HOSPITALIST CONSULT NOTE   Requestig physician: Dr. Preston Fleeting  Reason for Consult: New onset seizure  History obtained from:  Patient     HPI:                                                                                                                                          Amber [name] is an 60 y.o. female who presented to the ED with c/c of left arm tremor, which began at about 3 AM on Wednesday. Dr. Preston Fleeting observed the intermittent jerking movements of her left arm and felt that they most likely represented partial seizure activity. She was loaded with 1000 mg Keppra IV.   She also has a history of recent fall on Thanksgiving, striking her head at that time with LOC. Since the fall she has had headache and dizziness.   She has a history of seizures, with last seizure occurring in March of 2015. She is not on any anticonvulsant medication at home. At time of prior admission for chest pain in October of 2016, she was taking Keppra.    A CT head obtained at 0054 this morning revealed findings concerning for subacute ischemia in the posterior right frontal lobe.   Past Medical History:  Diagnosis Date  . Arthritis   . Asthma   . Chronic pain   . Constipation   . Diabetes mellitus without complication (HCC)   . Falls frequently   . GERD (gastroesophageal reflux disease)   . Hypertension   . Pneumonia 10/2015  . Seizures (HCC)    last seizure March 2015    Past Surgical History:  Procedure Laterality Date  . APPENDECTOMY    . BACK SURGERY    . CHOLECYSTECTOMY      Family History  Problem Relation Age of Onset  . Kidney failure Mother   . Hypertension Sister   . Hypertension Brother    Social History:  reports that she has quit smoking. She has never used smokeless tobacco. She reports that she does not drink alcohol or use drugs.  Allergies  Allergen Reactions  . Ivp Dye [Iodinated Diagnostic Agents] Itching    HOME MEDICATIONS:  Current Meds  Medication Sig  . albuterol (PROVENTIL HFA;VENTOLIN HFA) 108 (90 BASE) MCG/ACT inhaler Inhale 2 puffs into the lungs every 6 (six) hours as needed for wheezing or shortness of breath.  Marland Kitchen. amLODipine (NORVASC) 5 MG tablet Take 5 mg by mouth daily.  Marland Kitchen. aspirin EC 81 MG tablet Take 1 tablet (81 mg total) by mouth daily.  Marland Kitchen. atorvastatin (LIPITOR) 10 MG tablet Take 10 mg by mouth daily.  . diazepam (VALIUM) 5 MG tablet Take 1 tablet (5 mg total) by mouth 2 (two) times daily. (Patient taking differently: Take 5 mg by mouth every 12 (twelve) hours as needed for anxiety. )  . feeding supplement, ENSURE ENLIVE, (ENSURE ENLIVE) LIQD Take 237 mLs by mouth 2 (two) times daily between meals.  . Fluticasone-Salmeterol (ADVAIR) 250-50 MCG/DOSE AEPB Inhale 1 puff into the lungs 2 (two) times daily.  . folic acid (FOLVITE) 1 MG tablet Take 1 mg by mouth daily.  . furosemide (LASIX) 40 MG tablet Take 1 tablet (40 mg total) by mouth daily.  Marland Kitchen. gabapentin (NEURONTIN) 300 MG capsule Take 1 capsule (300 mg total) by mouth 2 (two) times daily. (Patient taking differently: Take 300 mg by mouth at bedtime. )  . HYDROcodone-acetaminophen (NORCO/VICODIN) 5-325 MG tablet Take 1 tablet by mouth every 4 (four) hours as needed. (Patient taking differently: Take 1 tablet by mouth every 4 (four) hours as needed for moderate pain. )  . lactulose (CHRONULAC) 10 GM/15ML solution Take 30 mLs (20 g total) by mouth 2 (two) times daily as needed for severe constipation.  Marland Kitchen. LINZESS 145 MCG CAPS capsule Take 1 capsule (145 mcg total) by mouth daily as needed (constipation).  . metFORMIN (GLUCOPHAGE-XR) 500 MG 24 hr tablet Take 500 mg by mouth daily with breakfast.  . metoprolol succinate (TOPROL-XL) 25 MG 24 hr tablet Take 25 mg by mouth daily.  Marland Kitchen. oxymorphone 20 MG T12A Take 20 mg by mouth every 12 (twelve) hours.  .  pantoprazole (PROTONIX) 40 MG tablet Take 1 tablet (40 mg total) by mouth 2 (two) times daily before a meal.  . polyethylene glycol (MIRALAX / GLYCOLAX) packet Take 17 g by mouth 2 (two) times daily. (Patient taking differently: Take 17 g by mouth daily as needed for mild constipation. )  . potassium chloride SA (K-DUR,KLOR-CON) 20 MEQ tablet Take 2 tablets (40 mEq total) by mouth 2 (two) times daily. (Patient taking differently: Take 20-40 mEq by mouth 2 (two) times daily as needed (cramping). )  . predniSONE (DELTASONE) 10 MG tablet Take 15 mg by mouth daily with breakfast.  . promethazine (PHENERGAN) 25 MG tablet Take 1 tablet by mouth every 6 (six) hours as needed for nausea or vomiting. nausea  . Vitamin D, Ergocalciferol, (DRISDOL) 50000 UNITS CAPS capsule Take 1 capsule (50,000 Units total) by mouth every 7 (seven) days. Sunday (Patient taking differently: Take 50,000 Units by mouth every 7 (seven) days. Monday)  . VITAMIN E PO Take 1 capsule by mouth daily.      ROS:  History obtained from patient. No chest pain or abdominal pain. Positive for diffuse joint pains.   Blood pressure 104/82, pulse 86, temperature 98.8 F (37.1 C), temperature source Oral, resp. rate 14, height 5' (1.524 m), weight 64.4 kg (142 lb), SpO2 100 %.   General Examination:                                                                                                      HEENT-  Normocephalic/atraumatic. Lungs- Respirations unlabored. No gross wheezes.  Extremities- Warm and well perfused.   Neurological Examination Mental Status: Alert, oriented, thought content appropriate.  Speech fluent without evidence of aphasia.  Able to follow all commands without difficulty. Cranial Nerves: II: Visual fields intact to confrontation, PERRL III,IV, VI: ptosis not present, EOMI without  nystagmus V,VII: smile symmetric, facial temperature sensation normal bilaterally VIII: hearing intact to conversation IX,X: No hypophonia or hoarseness XI: symmetric XII: midline tongue extension Motor: Diffuse weakness noted, with some limitations ro ROM noted in the context of significant arthritic pain. No asymmetry noted.  Right : Upper extremity   4/5    Left:     Upper extremity   4/5  Lower extremity   4/5     Lower extremity   4/5 Decreased bulk in all 4 extremities.  Sensory: Temperature and light touch intact x 4, without extinction.  Deep Tendon Reflexes: Deferred due to severe joint pain diffusely.  Plantars: Right: downgoing   Left: downgoing Cerebellar: No ataxia noted.  Gait: Deferred due to diffuse weakness and joint pain   Lab Results: Basic Metabolic Panel:  Recent Labs Lab 10/26/16 0010  NA 142  K 3.4*  CL 112*  CO2 21*  GLUCOSE 87  BUN 13  CREATININE 1.00  CALCIUM 8.4*    Liver Function Tests:  Recent Labs Lab 10/26/16 0010  AST 41  ALT 29  ALKPHOS 121  BILITOT 0.6  PROT 6.1*  ALBUMIN 2.7*   No results for input(s): LIPASE, AMYLASE in the last 168 hours. No results for input(s): AMMONIA in the last 168 hours.  CBC:  Recent Labs Lab 10/26/16 0010  WBC 9.8  NEUTROABS 5.5  HGB 10.4*  HCT 31.6*  MCV 77.8*  PLT 203    Cardiac Enzymes: No results for input(s): CKTOTAL, CKMB, CKMBINDEX, TROPONINI in the last 168 hours.  Lipid Panel: No results for input(s): CHOL, TRIG, HDL, CHOLHDL, VLDL, LDLCALC in the last 168 hours.  CBG: No results for input(s): GLUCAP in the last 168 hours.  Microbiology: Results for orders placed or performed during the hospital encounter of 02/28/16  Blood Culture (routine x 2)     Status: None   Collection Time: 02/28/16  5:30 PM  Result Value Ref Range Status   Specimen Description BLOOD RIGHT ANTECUBITAL  Final   Special Requests BOTTLES DRAWN AEROBIC AND ANAEROBIC 10CC EACH  Final   Culture   Final     NO GROWTH 5 DAYS Performed at [facility]    Report Status 03/04/2016 FINAL  Final  Urine culture  Status: None   Collection Time: 02/28/16  7:50 PM  Result Value Ref Range Status   Specimen Description URINE, CATHETERIZED  Final   Special Requests NONE  Final   Culture   Final    NO GROWTH 2 DAYS Performed at [facility]    Report Status 03/01/2016 FINAL  Final  Blood Culture (routine x 2)     Status: None   Collection Time: 02/28/16  8:00 PM  Result Value Ref Range Status   Specimen Description BLOOD LEFT ANTECUBITAL  Final   Special Requests   Final    BOTTLES DRAWN AEROBIC AND ANAEROBIC BLUE 6CC RED 5CC   Culture   Final    NO GROWTH 5 DAYS Performed at [facility]    Report Status 03/04/2016 FINAL  Final    Coagulation Studies: No results for input(s): LABPROT, INR in the last 72 hours.  Imaging: Ct Head Wo Contrast  Result Date: 10/26/2016 CLINICAL DATA:  Fall 2 weeks ago. Fall backward after walker malfunction. Focal seizure. EXAM: CT HEAD WITHOUT CONTRAST TECHNIQUE: Contiguous axial images were obtained from the base of the skull through the vertex without intravenous contrast. COMPARISON:  Head CT 02/28/2016 FINDINGS: Brain: Focal cortical hypodensity in the posterior right frontal lobe concerning for subacute ischemia. No intracranial hemorrhage. Background chronic small vessel ischemia is similar. No subdural or extra-axial fluid collection. No hydrocephalus. Vascular: Atherosclerosis of skullbase vasculature without hyperdense vessel or abnormal calcification. Skull: No acute fracture. Stable sclerotic focus in the right frontal bone. Sinuses/Orbits: Paranasal sinuses and mastoid air cells are clear. The visualized orbits are unremarkable. Other: None. IMPRESSION: Findings concerning for subacute ischemia in the posterior right frontal lobe. These results were called by telephone at the time of interpretation on 10/26/2016 at 1:34 am to Dr.  Dione Booze , who verbally acknowledged these results. Electronically Signed   By: Rubye Oaks M.D.   On: 10/26/2016 01:34    Assessment: 1.Recurrent motor activity involving LUE, resembling seizure. MRI brain reveals restricted diffusion along the motor strip on the right, mostly sparing the underlying white matter. This is a finding which can be seen in epilepsia partialis continua, due to cytotoxic edema within the affected cortex and is often reversible if seizures can be controlled. 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 Of note, following Keppra load her jerking has diminished somewhat, but has not resolved.  2. Additional findings on MRI concerning for multiple small subacute ischemic infarctions involving separate vascular territories. Most likely secondary to shower emboli, although vasculitis could also present with such findings.  3. 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 This increases the probability of vasculitis as the etiology for the strokes seen on MRI.  4. DM with poorly controlled blood sugars at home.   Recommendations: 1. Video EEG.  2. Continue Keppra at scheduled dose of 1000 mg IV or PO BID.  3. Load valproic acid, 20 mg/kg and observe for possible resolution of partial seizures affecting LUE. After loading dose, start scheduled valproic acid at 5 mg/kg po TID.  4. Full stroke work up to include MRA of head and neck, TTE. If TTE is negative, obtain TEE.  5. If stroke work up is unremarkable, obtain LP to assess for possible markers of CNS vasculitis.  6. Mg level.  7. Start therapeutic Lovenox for secondary stroke prevention in setting of possible central embolic phenomenon. Consider  bridging to Coumadin pending results of stroke work up. Discontinue ASA while therapeutically anticoagulated. If work up reveals no evidence for cardioembolic phenomenon, can  switch back from anticoagulation to dual antiplatelet therapy, as she has failed antiplatelet monotherapy.    Electronically signed: Dr. Caryl Pina 10/26/2016, 2:00 AM

## 2016-10-26 NOTE — ED Notes (Signed)
Patient transported to X-ray 

## 2016-10-26 NOTE — Progress Notes (Signed)
STROKE TEAM PROGRESS NOTE   HISTORY OF PRESENT ILLNESS (per record) Amber [name] is an 60 y.o. female who presented to the ED with c/c of left arm tremor, which began at about 3 AM on Wednesday 10/25/2016 (LKW). Dr. Roxanne Mins observed the intermittent jerking movements of her left arm and felt that they most likely represented partial seizure activity. She was loaded with 1000 mg Keppra IV.  She also has a history of recent fall on Thanksgiving, striking her head at that time with LOC. Since the fall she has had headache and dizziness. She has a history of seizures, with last seizure occurring in March of 2015. She is not on any anticonvulsant medication at home. At time of prior admission for chest pain in October of 2016, she was taking Keppra.    A CT head obtained at 0054 this morning revealed findings concerning for subacute ischemia in the posterior right frontal lobe.   Patient was not administered IV t-PA. She was admitted for further evaluation and treatment.   SUBJECTIVE (INTERVAL HISTORY) No family is at bedside. She is in pain with LBP and right shoulder pain. She also has tachycardia at 148 on tele. She was started on lovenox '65mg'$  bid. EEG negative for seizure and she is on keppra '500mg'$  bid. She is also on prednisone '15mg'$  daily at home for asthma and COPD. I ordered EKG again due to tachycardia and right shoulder X-ray to rule out fracture. I also discontinued lovenox for now as we need to finish stroke work up first.    OBJECTIVE Temp:  [98.8 F (37.1 C)] 98.8 F (37.1 C) (12/07 0013) Pulse Rate:  [80-130] 128 (12/07 1230) Resp:  [12-21] 14 (12/07 1245) BP: (99-131)/(73-104) 108/85 (12/07 1245) SpO2:  [97 %-100 %] 99 % (12/07 1245) Weight:  [64.4 kg (142 lb)] 64.4 kg (142 lb) (12/07 0011)  CBC:  Recent Labs Lab 10/26/16 0010  WBC 9.8  NEUTROABS 5.5  HGB 10.4*  HCT 31.6*  MCV 77.8*  PLT 782    Basic Metabolic Panel:  Recent Labs Lab 10/26/16 0010  NA 142  K 3.4*   CL 112*  CO2 21*  GLUCOSE 87  BUN 13  CREATININE 1.00  CALCIUM 8.4*    Lipid Panel:    Component Value Date/Time   CHOL 86 05/25/2016 0735   TRIG 93 05/25/2016 0735   HDL 36 (L) 05/25/2016 0735   CHOLHDL 2.4 05/25/2016 0735   VLDL 19 05/25/2016 0735   LDLCALC 31 05/25/2016 0735   HgbA1c:  Lab Results  Component Value Date   HGBA1C 5.5 02/29/2016   Urine Drug Screen:    Component Value Date/Time   LABOPIA POSITIVE (A) 05/25/2016 0301   COCAINSCRNUR NONE DETECTED 05/25/2016 0301   LABBENZ POSITIVE (A) 05/25/2016 0301   AMPHETMU NONE DETECTED 05/25/2016 0301   THCU NONE DETECTED 05/25/2016 0301   LABBARB NONE DETECTED 05/25/2016 0301      IMAGING I have personally reviewed the radiological images below and agree with the radiology interpretations.  Ct Head Wo Contrast 10/26/2016 Findings concerning for subacute ischemia in the posterior right frontal lobe.   Mr Brain Wo Contrast 10/26/2016 Numerous supra and infratentorial acute infarcts spanning multiple vascular territories, largest in RIGHT frontoparietal lobes corresponding to CT abnormality. Findings consistent with central embolic phenomena. Mild to moderate chronic small vessel ischemic disease, advanced for age.  EEG -  This awake and asleep EEG is mildly abnormal due to mild diffuse slowing of the waking background. Clinical  Correlation of the above findings indicates diffuse cerebral dysfunction that is non-specific in etiology and can be seen with hypoxic/ischemic injury, toxic/metabolic encephalopathies, neurodegenerative disorders, or medication effect.  The absence of epileptiform discharges does not rule out a clinical diagnosis of epilepsy.  Clinical correlation is advised.  Dg Shoulder Right 10/26/2016 IMPRESSION: Deformity of glenoid rim most pronounced superiorly may represent an impacted fracture. No dislocation. Ossific densities surrounding proximal humerus may represent intra-articular bodies.    Mr [facility] and neck Wo Contrast 10/26/2016 IMPRESSION: No proximal intracranial stenosis or dissection. No extracranial carotid bifurcation, proximal ICA, or visible great vessel stenosis. Dominant RIGHT vertebral is the sole contributor to the basilar. LEFT vertebral terminates in PICA. Given the relatively unimpressive appearance of the extracranial and intracranial circulation, a cardiac source of emboli is suspected. No large vessel vasculitic change. Mild irregularity of the distal MCA and PCA branches more suggestive of intracranial atherosclerotic change   EKG - pending  TCD bubble study - pending  LE venous doppler - pending  RUE venous doppler - pending  TTE pending     PHYSICAL EXAM  Temp:  [98.8 F (37.1 C)] 98.8 F (37.1 C) (12/07 0013) Pulse Rate:  [80-130] 128 (12/07 1230) Resp:  [12-21] 14 (12/07 1245) BP: (99-131)/(73-104) 108/85 (12/07 1245) SpO2:  [97 %-100 %] 99 % (12/07 1245) Weight:  [142 lb (64.4 kg)] 142 lb (64.4 kg) (12/07 0011)  General - Well nourished, well developed, in acute distress due to pain.  Ophthalmologic - Fundi not visualized due to distress.  Cardiovascular - tachycardia and HR at 140s.  Mental Status -  Level of arousal and orientation to time, place, and person were intact. Language including expression, naming, repetition, comprehension was assessed and found intact. Fund of Knowledge was assessed and was intact.  Cranial Nerves II - XII - II - Visual field intact OU. III, IV, VI - Extraocular movements intact. V - Facial sensation intact bilaterally. VII - Facial movement intact bilaterally. VIII - Hearing & vestibular intact bilaterally. X - Palate elevates symmetrically. XI - Chin turning & shoulder shrug intact bilaterally. XII - Tongue protrusion intact.  Motor Strength - The patient's strength was normal at LUE, RLE, however LLE proximal 5-/5 and distal DF 3+/5 and PF 4/5, RUE distally 5/5 but proximally very painful  at shoulder with limited ROM and pronator drift was absent.  Bulk was normal and fasciculations were absent.   Motor Tone - Muscle tone was assessed at the neck and appendages and was normal.  Reflexes - The patient's reflexes were 1+ in all extremities and she had no pathological reflexes.  Sensory - Light touch, temperature/pinprick were assessed and were symmetrical.    Coordination - The patient had normal movements in the left hand with no ataxia or dysmetria.  Tremor was absent.  Gait and Station - not tested   ASSESSMENT/PLAN Amber [name] is a 60 y.o. female with history of HTN, NIDDM, HFpEF, seizures, chronic pain, and asthma on chronic prednisone presenting with new onset L arm tremor. The TEE showed posterior right frontal lobe infarct.   She did not receive IV t-PA.   StrokeA:  Multiple bilateral anterior and supratentorial infarcts, embolic secondary to unknown source. Due to unremarkable vessel images, cardioembolic most likely. Needs to rule out paradoxical emboli due to shoulder pain and potential DVT. TEE will be scheduled. Hypercoagulable work up underway. Feels vasculitis less likely as pt is on steroids daily for asthma/COPD, but still in DDx. Endocarditis  also less likely due to normal WBC and afebrile.   MRI  numerous supra and infratentorial bilateral embolic infarcts, largest at right MCA frontal cortical region  MRA head and neck unremarkable  2D Echo  pending   TCD with bubble study - pending  RUE and BLE venous doppler - pending  EKG - pending   TEE to look for embolic source. Arranged with [place] for tomorrow.  If positive for PFO (patent foramen ovale), check bilateral lower extremity venous dopplers to rule out DVT as possible source of stroke. (I have made patient NPO after midnight tonight).   LDL pending   HgbA1c pending  UDS pending  Hypercoagulable work up pending (anti--DNA, ANA, cardiolipin antibody,  homocystine, beta 2 glycoprotein, lupus anticoagulant, CRP, ESR, HIV, RPR)  VitB12, TSH pending  lovenox for VTE prophylaxis.  Diet NPO time specified  Diet NPO time specified  aspirin 81 prior to admission, now on ASA '325mg'$ . Pt was ordered with therapeutic lovenox initially (not started yet), but was discontinued due to potential right shoulder surgery and pending further stroke work up.  Patient counseled to be compliant with her antithrombotic medications  Ongoing aggressive stroke risk factor management  Therapy recommendations:  pending   Disposition:  pending   Partial seizure  Was witness in ED with left arm shaking  Loaded with keppra '1000mg'$  IV  Now on keppra '500mg'$  bid  EEG showed mild slowing and no seizure  Hx of seizure no on AEDs now, last seizure 01/2014  Right shoulder pain  Pt had recent fall  Right shoulder X-ray concerning for fracture  May consider ortho consult  Hypertension  Stable  Permissive hypertension (OK if < 220/120) but gradually normalize in 5-7 days  Long-term BP goal normotensive  Hyperlipidemia  Home meds:  lipitor 10, resumed in hospital  LDL pending, goal < 70  Continue statin at discharge  Diabetes  HgbA1c pending, goal < 7.0  SSI  CBG monitoring  Other Stroke Risk Factors  Overweight, Body mass index is 27.73 kg/m., recommend weight loss, diet and exercise as appropriate   Other Active Problems  Chronic pain. Oxymorphone  Asthma on chronic steroids  Hospital day # 0  The patient is at risk for recurrent strokes and TIAs and neurological worsening and she needs ongoing stroke evaluation and aggressive risk factor modification. I have discussed with Dr. Allyson Sabal regarding embolic stroke work up, tachycardia, right shoulder injury with potential surgery, seizure control. I spent large amount of time to review images, EKG, discuss with pt and coordinating care.  Rosalin Hawking, MD PhD Stroke  Neurology 10/26/2016 5:43 PM   To contact Stroke Continuity provider, please refer to [email or web address] After hours, contact General Neurology

## 2016-10-27 ENCOUNTER — Inpatient Hospital Stay (HOSPITAL_COMMUNITY): Payer: Medicaid Other

## 2016-10-27 DIAGNOSIS — M25511 Pain in right shoulder: Secondary | ICD-10-CM

## 2016-10-27 DIAGNOSIS — I6340 Cerebral infarction due to embolism of unspecified cerebral artery: Secondary | ICD-10-CM

## 2016-10-27 DIAGNOSIS — I639 Cerebral infarction, unspecified: Secondary | ICD-10-CM

## 2016-10-27 DIAGNOSIS — I6789 Other cerebrovascular disease: Secondary | ICD-10-CM

## 2016-10-27 DIAGNOSIS — M79609 Pain in unspecified limb: Secondary | ICD-10-CM

## 2016-10-27 DIAGNOSIS — R000 Tachycardia, unspecified: Secondary | ICD-10-CM

## 2016-10-27 DIAGNOSIS — R569 Unspecified convulsions: Secondary | ICD-10-CM

## 2016-10-27 LAB — ECHOCARDIOGRAM COMPLETE
E/e' ratio: 9.16
EWDT: 156 ms
FS: 36 % (ref 28–44)
HEIGHTINCHES: 60 in
IVS/LV PW RATIO, ED: 0.88
LA ID, A-P, ES: 33 mm
LA diam index: 2.29 cm/m2
LA vol A4C: 33.5 ml
LA vol index: 22.2 mL/m2
LAVOL: 32 mL
LEFT ATRIUM END SYS DIAM: 33 mm
LV E/e'average: 9.16
LVEEMED: 9.16
LVELAT: 5.8 cm/s
LVOT VTI: 14.7 cm
LVOT area: 2.54 cm2
LVOT diameter: 18 mm
LVOTPV: 78.8 cm/s
LVOTSV: 37 mL
MV Dec: 156
MV pk A vel: 66.8 m/s
MVPKEVEL: 53.1 m/s
PW: 13.3 mm — AB (ref 0.6–1.1)
RV LATERAL S' VELOCITY: 9.34 cm/s
TDI e' lateral: 5.8
TDI e' medial: 4.28
WEIGHTICAEL: 1724.88 [oz_av]

## 2016-10-27 LAB — LIPID PANEL
CHOLESTEROL: 105 mg/dL (ref 0–200)
HDL: 61 mg/dL (ref >40)
LDL Cholesterol: 33 mg/dL (ref 0–99)
TRIGLYCERIDES: 55 mg/dL (ref <150)
Total CHOL/HDL Ratio: 1.7 RATIO
VLDL: 11 mg/dL (ref 0–40)

## 2016-10-27 LAB — COMPREHENSIVE METABOLIC PANEL
ALK PHOS: 112 U/L (ref 38–126)
ALT: 22 U/L (ref 14–54)
ANION GAP: 9 (ref 5–15)
AST: 23 U/L (ref 15–41)
Albumin: 2.2 g/dL — ABNORMAL LOW (ref 3.5–5.0)
BUN: 9 mg/dL (ref 6–20)
CALCIUM: 8.3 mg/dL — AB (ref 8.9–10.3)
CHLORIDE: 110 mmol/L (ref 101–111)
CO2: 20 mmol/L — ABNORMAL LOW (ref 22–32)
CREATININE: 0.89 mg/dL (ref 0.44–1.00)
Glucose, Bld: 89 mg/dL (ref 65–99)
Potassium: 4.1 mmol/L (ref 3.5–5.1)
SODIUM: 139 mmol/L (ref 135–145)
Total Bilirubin: 0.5 mg/dL (ref 0.3–1.2)
Total Protein: 5.3 g/dL — ABNORMAL LOW (ref 6.5–8.1)

## 2016-10-27 LAB — VITAMIN B12: VITAMIN B 12: 549 pg/mL (ref 180–914)

## 2016-10-27 LAB — SEDIMENTATION RATE: SED RATE: 25 mm/h — AB (ref 0–22)

## 2016-10-27 LAB — GLUCOSE, CAPILLARY
GLUCOSE-CAPILLARY: 103 mg/dL — AB (ref 65–99)
GLUCOSE-CAPILLARY: 254 mg/dL — AB (ref 65–99)
GLUCOSE-CAPILLARY: 88 mg/dL (ref 65–99)
Glucose-Capillary: 94 mg/dL (ref 65–99)
Glucose-Capillary: 169 mg/dL — ABNORMAL HIGH (ref 65–99)
Glucose-Capillary: 140 mg/dL — ABNORMAL HIGH (ref 65–99)

## 2016-10-27 LAB — C-REACTIVE PROTEIN: CRP: 0.9 mg/dL (ref <1.0)

## 2016-10-27 LAB — CBC
HCT: 29.7 % — ABNORMAL LOW (ref 36.0–46.0)
Hemoglobin: 9.9 g/dL — ABNORMAL LOW (ref 12.0–15.0)
MCH: 25.4 pg — AB (ref 26.0–34.0)
MCHC: 33.3 g/dL (ref 30.0–36.0)
MCV: 76.2 fL — AB (ref 78.0–100.0)
PLATELETS: 233 10*3/uL (ref 150–400)
RBC: 3.9 MIL/uL (ref 3.87–5.11)
RDW: 16.2 % — ABNORMAL HIGH (ref 11.5–15.5)
WBC: 12.1 10*3/uL — ABNORMAL HIGH (ref 4.0–10.5)

## 2016-10-27 LAB — TSH: TSH: 0.852 u[IU]/mL (ref 0.350–4.500)

## 2016-10-27 LAB — RPR: RPR: NONREACTIVE

## 2016-10-27 LAB — HIV ANTIBODY (ROUTINE TESTING W REFLEX): HIV SCREEN 4TH GENERATION: NONREACTIVE

## 2016-10-27 MED ORDER — TECHNETIUM TC 99M DIETHYLENETRIAME-PENTAACETIC ACID: 31.6000 | Freq: Once | INTRAVENOUS | Status: DC | PRN

## 2016-10-27 MED ORDER — METOPROLOL TARTRATE 5 MG/5ML IV SOLN
5.0000 mg | INTRAVENOUS | Status: DC | PRN
  Administered 2016-10-27 – 2016-10-31 (×6): 5 mg via INTRAVENOUS
  Filled 2016-10-27 (×7): qty 5

## 2016-10-27 MED ORDER — TECHNETIUM TO 99M ALBUMIN AGGREGATED
4.3800 | Freq: Once | INTRAVENOUS | Status: AC | PRN
  Administered 2016-10-27: 4.38 via INTRAVENOUS

## 2016-10-27 MED ORDER — AMLODIPINE BESYLATE 5 MG PO TABS
5.0000 mg | ORAL_TABLET | Freq: Every day | ORAL | Status: DC
  Administered 2016-10-27 – 2016-10-31 (×5): 5 mg via ORAL
  Filled 2016-10-27 (×5): qty 1

## 2016-10-27 MED ORDER — POTASSIUM CHLORIDE CRYS ER 20 MEQ PO TBCR
20.0000 meq | EXTENDED_RELEASE_TABLET | Freq: Two times a day (BID) | ORAL | Status: AC
  Administered 2016-10-27 (×2): 20 meq via ORAL
  Filled 2016-10-27 (×2): qty 1

## 2016-10-27 MED ORDER — LACOSAMIDE 50 MG PO TABS
100.0000 mg | ORAL_TABLET | Freq: Two times a day (BID) | ORAL | Status: DC
  Administered 2016-10-27 – 2016-10-31 (×8): 100 mg via ORAL
  Filled 2016-10-27 (×8): qty 2

## 2016-10-27 MED ORDER — SODIUM CHLORIDE 0.9 % IV SOLN
200.0000 mg | Freq: Once | INTRAVENOUS | Status: AC
  Administered 2016-10-27: 200 mg via INTRAVENOUS
  Filled 2016-10-27 (×2): qty 20

## 2016-10-27 NOTE — Consult Note (Signed)
ORTHOPAEDIC CONSULTATION  REQUESTING PHYSICIAN: Richarda Overlie, MD  Chief Complaint: right shoulder pain  HPI: Amber [name] is a 60 y.o. female who presents with right glenoid fracture s/p mechanical fall 2 days ago.  Pain is severe in the right shoulder that's worse with movement, doesn't radiate, throbs.  Ortho consulted.  She also has embolic CVA. Getting TEE today  Past Medical History:  Diagnosis Date  . Arthritis   . Asthma   . Chronic pain   . Constipation   . Diabetes mellitus without complication (HCC)   . Falls frequently   . GERD (gastroesophageal reflux disease)   . Hypertension   . Pneumonia 10/2015  . Seizures (HCC)    last seizure March 2015   Past Surgical History:  Procedure Laterality Date  . APPENDECTOMY    . BACK SURGERY    . CHOLECYSTECTOMY     Social History   Social History  . Marital status: Single    Spouse name: N/A  . Number of children: N/A  . Years of education: N/A   Social History Main Topics  . Smoking status: Former Games developer  . Smokeless tobacco: Never Used  . Alcohol use No     Comment: admits to 2 drinks/week  . Drug use: No  . Sexual activity: No   Other Topics Concern  . None   Social History Narrative  . None   Family History  Problem Relation Age of Onset  . Kidney failure Mother   . Hypertension Sister   . Hypertension Brother    - negative except otherwise stated in the family history section Allergies  Allergen Reactions  . Ivp Dye [Iodinated Diagnostic Agents] Itching   Prior to Admission medications   Medication Sig Start Date End Date Taking? Authorizing Provider  albuterol (PROVENTIL HFA;VENTOLIN HFA) 108 (90 BASE) MCG/ACT inhaler Inhale 2 puffs into the lungs every 6 (six) hours as needed for wheezing or shortness of breath.   Yes Historical Provider, MD  amLODipine (NORVASC) 5 MG tablet Take 5 mg by mouth daily.   Yes Historical Provider, MD  aspirin EC 81 MG tablet Take 1 tablet (81 mg total) by mouth  daily. 06/14/15  Yes Richarda Overlie, MD  atorvastatin (LIPITOR) 10 MG tablet Take 10 mg by mouth daily. 08/13/15  Yes Historical Provider, MD  diazepam (VALIUM) 5 MG tablet Take 1 tablet (5 mg total) by mouth 2 (two) times daily. Patient taking differently: Take 5 mg by mouth every 12 (twelve) hours as needed for anxiety.  05/04/16  Yes Elson Areas, PA-C  feeding supplement, ENSURE ENLIVE, (ENSURE ENLIVE) LIQD Take 237 mLs by mouth 2 (two) times daily between meals. 11/10/15  Yes Catarina Hartshorn, MD  Fluticasone-Salmeterol (ADVAIR) 250-50 MCG/DOSE AEPB Inhale 1 puff into the lungs 2 (two) times daily.   Yes Historical Provider, MD  folic acid (FOLVITE) 1 MG tablet Take 1 mg by mouth daily.   Yes Historical Provider, MD  furosemide (LASIX) 40 MG tablet Take 1 tablet (40 mg total) by mouth daily. 05/28/16  Yes Penny Pia, MD  gabapentin (NEURONTIN) 300 MG capsule Take 1 capsule (300 mg total) by mouth 2 (two) times daily. Patient taking differently: Take 300 mg by mouth at bedtime.  06/14/15  Yes Richarda Overlie, MD  HYDROcodone-acetaminophen (NORCO/VICODIN) 5-325 MG tablet Take 1 tablet by mouth every 4 (four) hours as needed. Patient taking differently: Take 1 tablet by mouth every 4 (four) hours as needed for moderate pain.  07/01/16  Yes Rolland Porter, MD  lactulose (CHRONULAC) 10 GM/15ML solution Take 30 mLs (20 g total) by mouth 2 (two) times daily as needed for severe constipation. 08/22/16  Yes Jacalyn Lefevre, MD  LINZESS 145 MCG CAPS capsule Take 1 capsule (145 mcg total) by mouth daily as needed (constipation). 05/26/16  Yes Penny Pia, MD  metFORMIN (GLUCOPHAGE-XR) 500 MG 24 hr tablet Take 500 mg by mouth daily with breakfast.   Yes Historical Provider, MD  metoprolol succinate (TOPROL-XL) 25 MG 24 hr tablet Take 25 mg by mouth daily.   Yes Historical Provider, MD  oxymorphone 20 MG T12A Take 20 mg by mouth every 12 (twelve) hours. 03/01/16  Yes Richarda Overlie, MD  pantoprazole (PROTONIX) 40 MG tablet Take 1  tablet (40 mg total) by mouth 2 (two) times daily before a meal. 12/25/15  Yes Ripudeep K Rai, MD  polyethylene glycol (MIRALAX / GLYCOLAX) packet Take 17 g by mouth 2 (two) times daily. Patient taking differently: Take 17 g by mouth daily as needed for mild constipation.  05/26/16  Yes Penny Pia, MD  potassium chloride SA (K-DUR,KLOR-CON) 20 MEQ tablet Take 2 tablets (40 mEq total) by mouth 2 (two) times daily. Patient taking differently: Take 20-40 mEq by mouth 2 (two) times daily as needed (cramping).  08/22/16  Yes Jacalyn Lefevre, MD  predniSONE (DELTASONE) 10 MG tablet Take 15 mg by mouth daily with breakfast.   Yes Historical Provider, MD  promethazine (PHENERGAN) 25 MG tablet Take 1 tablet by mouth every 6 (six) hours as needed for nausea or vomiting. nausea 10/06/15  Yes Historical Provider, MD  Vitamin D, Ergocalciferol, (DRISDOL) 50000 UNITS CAPS capsule Take 1 capsule (50,000 Units total) by mouth every 7 (seven) days. Sunday Patient taking differently: Take 50,000 Units by mouth every 7 (seven) days. Monday 01/27/15  Yes Calvert Cantor, MD  VITAMIN E PO Take 1 capsule by mouth daily.   Yes Historical Provider, MD   Dg Shoulder Right  Result Date: 10/26/2016 CLINICAL DATA:  60 y/o F; status post fall with right shoulder pain. EXAM: RIGHT SHOULDER - 2+ VIEW COMPARISON:  05/19/2016 right shoulder radiographs. FINDINGS: The humeral head is seated on the glenoid. There is deformity of the glenoid rim most pronounced superiorly probably representing impacted fracture and there are small calcific densities surrounding the proximal humerus which may represent intra-articular bodies. Clavicle is obscured by the mandible. Chronic right upper posterior lateral rib fractures. IMPRESSION: Deformity of glenoid rim most pronounced superiorly may represent an impacted fracture. No dislocation. Ossific densities surrounding proximal humerus may represent intra-articular bodies. Electronically Signed   By: Mitzi Hansen M.D.   On: 10/26/2016 15:11   Ct Head Wo Contrast  Result Date: 10/26/2016 CLINICAL DATA:  Fall 2 weeks ago. Fall backward after walker malfunction. Focal seizure. EXAM: CT HEAD WITHOUT CONTRAST TECHNIQUE: Contiguous axial images were obtained from the base of the skull through the vertex without intravenous contrast. COMPARISON:  Head CT 02/28/2016 FINDINGS: Brain: Focal cortical hypodensity in the posterior right frontal lobe concerning for subacute ischemia. No intracranial hemorrhage. Background chronic small vessel ischemia is similar. No subdural or extra-axial fluid collection. No hydrocephalus. Vascular: Atherosclerosis of skullbase vasculature without hyperdense vessel or abnormal calcification. Skull: No acute fracture. Stable sclerotic focus in the right frontal bone. Sinuses/Orbits: Paranasal sinuses and mastoid air cells are clear. The visualized orbits are unremarkable. Other: None. IMPRESSION: Findings concerning for subacute ischemia in the posterior right frontal lobe. These results were called by telephone at  the time of interpretation on 10/26/2016 at 1:34 am to Dr. Dione Booze , who verbally acknowledged these results. Electronically Signed   By: Rubye Oaks M.D.   On: 10/26/2016 01:34   Mr Maxine Glenn Head Wo Contrast  Result Date: 10/26/2016 CLINICAL DATA:  Suspected emboli. Multiple acute strokes. Seizure activity. EXAM: MRA NECK WITHOUT AND WITH CONTRAST MRA HEAD WITHOUT CONTRAST TECHNIQUE: Multiplanar and multiecho pulse sequences of the neck were obtained without and with intravenous contrast. Angiographic images of the neck were obtained using MRA technique without and with intravenous contrast.; Angiographic images of the Circle of Willis were obtained using MRA technique without intravenous contrast. CONTRAST:  73mL MULTIHANCE GADOBENATE DIMEGLUMINE 529 MG/ML IV SOLN COMPARISON:  MR brain earlier today. FINDINGS: MRA NECK FINDINGS Marked dolichoectasia that  suggesting longstanding hypertension. No visible ascending aortic dissection or proximal great vessel stenosis. Carotid bifurcations free of disease. No cervical ICA dissection or fibromuscular dysplasia. RIGHT vertebral is the sole contributor to the basilar. No flow-limiting stenosis through the neck or proximal intracranial segments. LEFT vertebral is poorly seen at its origin and there may be a significant ostial stenosis. In the neck the LEFT vertebral ascends in a fairly normal caliber, not as large as the RIGHT. Intracranially, it terminates in PICA. MRA HEAD FINDINGS Minor non stenotic atheromatous change in the cavernous and supraclinoid ICA segments bilaterally. Minor irregularity the anterior and middle cerebral arteries without flow-limiting proximal stenosis. No MCA branch occlusion. Basilar artery hypoplastic. This is in part due to BILATERAL fetal PCA anatomy. The RIGHT vertebral is the sole contributor to the basilar. LEFT vertebral terminates in PICA. Both PICA ease, and superior cerebellar arteries are patent. Anterior inferior cerebral artery is poorly visualized. Distal MCA and PCA branches are mildly irregular consistent with intracranial atherosclerotic change. No visible saccular aneurysm. IMPRESSION: No proximal intracranial stenosis or dissection. No extracranial carotid bifurcation, proximal ICA, or visible great vessel stenosis. Dominant RIGHT vertebral is the sole contributor to the basilar. LEFT vertebral terminates in PICA. Given the relatively unimpressive appearance of the extracranial and intracranial circulation, a cardiac source of emboli is suspected. No large vessel vasculitic change. Mild irregularity of the distal MCA and PCA branches more suggestive of intracranial atherosclerotic change Electronically Signed   By: Elsie Stain M.D.   On: 10/26/2016 16:44   Mr Maxine Glenn Neck W Wo Contrast  Result Date: 10/26/2016 CLINICAL DATA:  Suspected emboli. Multiple acute strokes. Seizure  activity. EXAM: MRA NECK WITHOUT AND WITH CONTRAST MRA HEAD WITHOUT CONTRAST TECHNIQUE: Multiplanar and multiecho pulse sequences of the neck were obtained without and with intravenous contrast. Angiographic images of the neck were obtained using MRA technique without and with intravenous contrast.; Angiographic images of the Circle of Willis were obtained using MRA technique without intravenous contrast. CONTRAST:  63mL MULTIHANCE GADOBENATE DIMEGLUMINE 529 MG/ML IV SOLN COMPARISON:  MR brain earlier today. FINDINGS: MRA NECK FINDINGS Marked dolichoectasia that suggesting longstanding hypertension. No visible ascending aortic dissection or proximal great vessel stenosis. Carotid bifurcations free of disease. No cervical ICA dissection or fibromuscular dysplasia. RIGHT vertebral is the sole contributor to the basilar. No flow-limiting stenosis through the neck or proximal intracranial segments. LEFT vertebral is poorly seen at its origin and there may be a significant ostial stenosis. In the neck the LEFT vertebral ascends in a fairly normal caliber, not as large as the RIGHT. Intracranially, it terminates in PICA. MRA HEAD FINDINGS Minor non stenotic atheromatous change in the cavernous and supraclinoid ICA segments bilaterally. Minor irregularity  the anterior and middle cerebral arteries without flow-limiting proximal stenosis. No MCA branch occlusion. Basilar artery hypoplastic. This is in part due to BILATERAL fetal PCA anatomy. The RIGHT vertebral is the sole contributor to the basilar. LEFT vertebral terminates in PICA. Both PICA ease, and superior cerebellar arteries are patent. Anterior inferior cerebral artery is poorly visualized. Distal MCA and PCA branches are mildly irregular consistent with intracranial atherosclerotic change. No visible saccular aneurysm. IMPRESSION: No proximal intracranial stenosis or dissection. No extracranial carotid bifurcation, proximal ICA, or visible great vessel stenosis.  Dominant RIGHT vertebral is the sole contributor to the basilar. LEFT vertebral terminates in PICA. Given the relatively unimpressive appearance of the extracranial and intracranial circulation, a cardiac source of emboli is suspected. No large vessel vasculitic change. Mild irregularity of the distal MCA and PCA branches more suggestive of intracranial atherosclerotic change Electronically Signed   By: Elsie Stain M.D.   On: 10/26/2016 16:44   Mr Brain Wo Contrast  Result Date: 10/26/2016 CLINICAL DATA:  Tremor, fell on Thanksgiving with head injury. Headache and dizziness. History of diabetes, seizures, hypertension. EXAM: MRI HEAD WITHOUT CONTRAST TECHNIQUE: Multiplanar, multiecho pulse sequences of the brain and surrounding structures were obtained without intravenous contrast. COMPARISON:  CT HEAD October 26, 2016 at 0101 hours FINDINGS: BRAIN: Confluent up to 4.2 cm reduced diffusion RIGHT posterior frontal parietal lobe, about the central sulcus. Corresponding low ADC values and faint T2 hyperintense signal. Numerous sub cm foci of reduced diffusion bilateral frontal, parietal and bilateral occipital lobes. Symmetric reduced diffusion mesial thalamus suggests artery of [place]on. Multiple foci of reduced diffusion measure up to 11 mm in the cerebellum with low ADC values. Ventricles and sulci are normal for patient's age. Patchy to confluent supratentorial white matter FLAIR T2 hyperintensities exclusive of the aforementioned abnormalities. No midline shift, mass effect or masses. No abnormal extra-axial fluid collections. VASCULAR: Normal major intracranial vascular flow voids present at skull base. SKULL AND UPPER CERVICAL SPINE: No abnormal sellar expansion. No suspicious calvarial bone marrow signal. Craniocervical junction maintained. Grade 1 C3-4 and C4-5 anterolisthesis. SINUSES/ORBITS: The mastoid air-cells and included paranasal sinuses are well-aerated. The included ocular globes and orbital  contents are non-suspicious. OTHER: Patient is edentulous. 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 Findings consistent with central embolic phenomena. Mild to moderate chronic small vessel ischemic disease, advanced for age. Electronically Signed   By: Awilda Metro M.D.   On: 10/26/2016 05:19   - pertinent xrays, CT, MRI studies were reviewed and independently interpreted  Positive ROS: All other systems have been reviewed and were otherwise negative with the exception of those mentioned in the HPI and as above.  Physical Exam: General: Alert, no acute distress Cardiovascular: No pedal edema Respiratory: No cyanosis, no use of accessory musculature GI: No organomegaly, abdomen is soft and non-tender Skin: No lesions in the area of chief complaint Neurologic: Sensation intact distally Psychiatric: Patient is competent for consent with normal mood and affect Lymphatic: No axillary or cervical lymphadenopathy  MUSCULOSKELETAL:  - NVI - pain with shoulder ROM - minimal swelling  Assessment: Right glenoid fx  Plan: - WBAT, sling for comfort - ROM as tolerated - will treat nonop  Thank you for the consult and the opportunity to see Ms. Olsson  N. Glee Arvin, MD [facility] [phone] 9:39 AM

## 2016-10-27 NOTE — Evaluation (Signed)
Speech Language Pathology Evaluation Patient Details Name: Amber [name] MRN: [id number] DOB: 1956/10/02 Today's Date: 10/27/2016 Time: 4431-5400 SLP Time Calculation (min) (ACUTE ONLY): 22 min  Problem List:  Patient Active Problem List   Diagnosis Date Noted  . Seizure (HCC) 10/26/2016  . Embolic stroke (HCC) 10/26/2016  . New onset atrial fibrillation (HCC) 10/26/2016  . 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 Focal seizure (HCC)   . Shoulder pain, right   . Duodenitis   . Abdominal pain 11/07/2015  . Constipation 11/07/2015  . Abdominal distension   . Chest pain, musculoskeletal 08/24/2015  . Diabetes mellitus type 2, controlled (HCC) 07/27/2015  . Chronic back pain   . Frequent falls   . Essential hypertension   . Pulmonary hypertension   . Gout   . Asthma 06/12/2015  . ACS (acute coronary syndrome) (HCC) 06/12/2015  . Microcytic anemia 01/26/2015  . History of seizures 01/26/2015   Past Medical History:  Past Medical History:  Diagnosis Date  . Arthritis   . Asthma   . Chronic pain   . Constipation   . Diabetes mellitus without complication (HCC)   . Falls frequently   . GERD (gastroesophageal reflux disease)   . Hypertension   . Pneumonia 10/2015  . Seizures (HCC)    last seizure March 2015   Past Surgical History:  Past Surgical History:  Procedure Laterality Date  . APPENDECTOMY    . BACK SURGERY    . CHOLECYSTECTOMY     HPI:  60 y.o.femalewith a past medical history significant for HTN, NIDDM, HFpEF, seizures, chronic pain, and asthma on chronic prednisonewho presents with new onset left arm tremor, right glenoid fx s/p fall several days ago.  CT 12/7 revealed subacute ischemia posterior right frontal lobe; MRI numerous supra and infratentorial bilateral embolic infarcts, largest at right MCA frontal cortical region.     Assessment / Plan / Recommendation Clinical Impression  Pt presents with baseline mild deficits in  memory and judgment; otherwise, cognition is functional and without acute changes.  Communication/speech WNL.  Pt will benefit from OT/PT evals; she expressed concerns over physical limitations and whether she can manage at home. Per her report, and son's affirmation, home health assistance recently has been spotty and unreliable.  No further acute SLP needs identified - our services will sign off.     SLP Assessment  Patient does not need any further Speech Lanaguage Pathology Services    Follow Up Recommendations    none   Frequency and Duration           SLP Evaluation Cognition  Overall Cognitive Status: History of cognitive impairments - at baseline Arousal/Alertness: Awake/alert Orientation Level: Oriented X4 Attention: Selective Selective Attention: Appears intact Memory: Impaired Memory Impairment: Retrieval deficit Awareness: Appears intact Problem Solving: Appears intact Safety/Judgment: Impaired Comments: at baseline per son       Comprehension  Auditory Comprehension Overall Auditory Comprehension: Appears within functional limits for tasks assessed    Expression Expression Primary Mode of Expression: Verbal   Oral / Motor  Oral Motor/Sensory Function Overall Oral Motor/Sensory Function: Within functional limits Motor Speech Overall Motor Speech: Appears within functional limits for tasks assessed   GO                    Blenda Mounts Laurice 10/27/2016, 12:02 PM  Adnan Vanvoorhis L. Samson Frederic, [place] CCC/SLP Pager [phone]

## 2016-10-27 NOTE — Progress Notes (Signed)
Inpatient Diabetes Program Recommendations  AACE/ADA: New Consensus Statement on Inpatient Glycemic Control (2015)  Target Ranges:  Prepandial:   less than 140 mg/dL      Peak postprandial:   less than 180 mg/dL (1-2 hours)      Critically ill patients:  140 - 180 mg/dL   Lab Results  Component Value Date   GLUCAP 88 10/27/2016   HGBA1C 5.5 02/29/2016    Review of Glycemic Control  Results for Amber [name], Amber [name] (MRN [id number]) as of 10/27/2016 11:08  Ref. Range 10/26/2016 12:21 10/26/2016 21:46 10/27/2016 00:07 10/27/2016 04:59 10/27/2016 08:16  Glucose-Capillary Latest Ref Range: 65 - 99 mg/dL 75 277 (H) 412 (H) 94 88    Diabetes history: Type 2 Outpatient Diabetes medications: Metformin 500mg  qday Current orders for Inpatient glycemic control: Novolog 0-9 units tid, Novolog 0-5 units qhs  * prednisone 15mg  q day  Inpatient Diabetes Program Recommendations: Since the patient is NPO, consider changing the Novolog  0-9 units to q4h, D/C Novolog 0-5 units qhs  , RN, , Susette Racer, CDE Diabetes Coordinator Inpatient Diabetes Program  [phone] (Team Pager) [phone] [facility] Office) 10/27/2016 11:12 AM

## 2016-10-27 NOTE — Plan of Care (Signed)
Problem: Education: Goal: Knowledge of disease or condition will improve Outcome: Progressing Patient began working with PT today- will have to return when patient's blood pressure under better control. Encouraging to do as much as her body allows. Discussed stroke with patient and family regarding causes and future prevention. Also discussed why we are performing all these tests to rule out clots in other parts of the body. Goal: Knowledge of secondary prevention will improve Outcome: Progressing Began educating patient about the signs of stroke and that if she ever has them again she should notify / call 911 immediately. Patient aware that we will work with her on educating her and that she will follow up outpatient. Also broached possibility of inpatient rehab prior to returning home.  Goal: Knowledge of patient specific risk factors addressed and post discharge goals established will improve Outcome: Progressing Extensive time spent discussing her risk factors for future stroke- especially her HTN and her lack of understanding of managing her diabetes. Patient does not understand what a healthy diabetic diet is. Teaching performed.  Problem: Health Behavior/Discharge Planning: Goal: Ability to manage health-related needs will improve Outcome: Progressing PT and SLP worked with patient today. WIll continue to follow her going forward. Recommending home PT/ OT when she returns home.   Problem: Nutrition: Goal: Risk of aspiration will decrease Outcome: Progressing Patient demonstrating no issues with swallowing and feeding self, although she is weaker in her left side of her body. She has been cleared to have a carb modified diet.   Problem: Tissue Perfusion: Goal: Cerebral tissue perfusion will improve (applicable to all stroke diagnoses) Outcome: Progressing Patient still has orders for an NIH scale every shift and patient neuro checked every 4 hours per unit protocol. No signs of decline  on this shift.  Goal: Complications of Ischemic Stroke will be minimized (choose ONE based on patient diagnosis) Outcome: Progressing No decline in neurological status, complaints of N/V, or Headache during this shift.

## 2016-10-27 NOTE — Progress Notes (Signed)
Initial Nutrition Assessment  DOCUMENTATION CODES:   Not applicable  INTERVENTION:   -RD will follow for diet advancement and supplement as appropriate  NUTRITION DIAGNOSIS:   Inadequate oral intake related to inability to eat as evidenced by NPO status.  GOAL:   Patient will meet greater than or equal to 90% of their needs  MONITOR:   Diet advancement, Labs, Weight trends, Skin, I & O's  REASON FOR ASSESSMENT:   Malnutrition Screening Tool    ASSESSMENT:   Amber [name] is a 60 y.o. female with a past medical history significant for HTN, NIDDM, HFpEF, seizures, chronic pain, and asthma on chronic prednisone who presents with arm shaking today.  Pt admitted with acute stroke.   12/7- s/p EEG which revealed cerebral dysfunction  Per nursing notes, pt required feeding assistance with dinner meal last night and consumed 25%.   Spoke with pt at bedside, who reports she typically has a good appetite. However, she reports intake was minimal over the past week secondary to a fall at home ("it made me lose my appetite") and has since been eating poorly as a result. Pt denies any chewing or swallowing difficulties. She estimates that she has lost 10-20# within the past week due to not eating.   Per wt hx, UBW around 145#. Per wt hx, pt has experienced 30.9% wt loss over the past 6 months, which is significant for time frame, however, RD questions accuracy of current wt, due to wt discrepancies. Noted recorded wt have varied between 107-142#. Used wt of 142# to estimated needs, due to closer to pt baseline.   Nutrition-Focused physical exam completed. Findings are no fat depletion, mild muscle depletion, and no edema. Pt typically walks with a rolling walker and wheelchair at home. She shares with this RD that she has been less ambulatory as her walker has been malfunctioning.   Per neurologist, pt NPO for scheduled TEE today. Pt is upset over NPO order and very eager to eat.  Discussed rationale for NPO order and also discussed importance of good PO intake to support healing once diet is advanced.   Labs reviewed.   Diet Order:  Diet NPO time specified Except for: Sips with Meds  Skin:  Reviewed, no issues  Last BM:  10/26/16  Height:   Ht Readings from Last 1 Encounters:  10/26/16 5' (1.524 m)    Weight:   Wt Readings from Last 1 Encounters:  10/26/16 107 lb 12.9 oz (48.9 kg)    Ideal Body Weight:  45.5 kg  BMI:  Body mass index is 21.05 kg/m.  Estimated Nutritional Needs:   Kcal:  1450-1650  Protein:  70-85  Fluid:  1.4-1.6 L  EDUCATION NEEDS:   Education needs addressed  Shayon Trompeter A. Mayford Knife, RD, LDN, CDE Pager: [phone] After hours Pager: [phone]

## 2016-10-27 NOTE — Progress Notes (Signed)
Preliminary results by tech - Right Upper Ext. Venous Completed. Negative for deep vein thrombosis. Positive for chronic appearing thrombus in a short segment of the cephalic vein in the upper arm. All other veins appeared open and patent without thrombus. Results given to [facility], patient's nurse. Marilynne Halsted, BS, RDMS, RVT

## 2016-10-27 NOTE — Procedures (Signed)
[facility]  [address]  [place].  [address].  [phone]   TRANSCRANIAL DOPPLER BUBBLE STUDY  Amber [name]  Date of Birth: Nov 16, 1956 Medical Record Number: [id number] Indications: Embolic stroke Date of Procedure: 10/27/2016 Clinical History: 60 year old female presented with an evolving stroke Technical Description: Transcranial Doppler Bubble Study was performed at the bedside after taking written informed consent from the patient and explaining risk/benefits. The right vertebral artery and basal artery was insonated using a hand held probe. And IV line had been previously inserted in the right forearm by the RN using aseptic precautions. With agitated saline injection at rest possible high intensity transient signals were heard. However, due to noisy background and the patient's snoring during the test, HITs are difficult to be confirmed.  Valsalva maneuver not performed due to noncooperation. Impression: Likely positive Transcranial Doppler Bubble Study indicative of a moderate right to left intracardiac shunt. We will have TEE on Monday to further confirm.  Marvel Plan, MD PhD Stroke Neurology 10/27/2016 5:43 PM

## 2016-10-27 NOTE — Progress Notes (Signed)
*  PRELIMINARY RESULTS* Vascular Ultrasound Transcranial Doppler with Bubbles has been completed.   Study was technically difficult due to poor patient cooperation. The right vertebral artery and basilar artery were insonated. The IV in the right forearm was utilized. Signals were heard shortly after bubble injection, suspicious for High Intensity Transient Signals/ medium PFO,  versus patient interference.  10/27/2016 3:04 PM Gertie Fey, BS, RVT, RDCS, RDMS

## 2016-10-27 NOTE — Evaluation (Signed)
Physical Therapy Evaluation Patient Details Name: Amber [name] MRN: [id number] DOB: 12/31/1955 Today's Date: 10/27/2016   History of Present Illness  This 60 y.o. female admitted with new onset Lt UE tremor which was thought to represent partial seizure.  SHe had a fall Thanksgiving head striking her head with LOC.  CT of head showed subacute ischemic infarct posterior Rt frontal lobe.  ALso with Rt shoulder pain xray revealed Rt glenoid fracture.  PMH includes:  Seizures, PNA, HTN, frequent falls  Clinical Impression  Pt presented supine in bed with HOB elevated, awake and willing to participate in therapy session. Prior to admission, pt reported that she ambulated with mod I with use of RW; however, stated that she frequently falls as her RW is in disrepair. Pt limited this session secondary to elevated HR and BP, 123 bpm and 141/116 mmHg in sitting EOB. Pt's nurse was notified. Pt would continue to benefit from skilled physical therapy services at this time while admitted and after d/c to address her below listed limitations in order to improve her overall safety and independence with functional mobility.     Follow Up Recommendations CIR    Equipment Recommendations  Other (comment) (defer to next venue)    Recommendations for Other Services Rehab consult     Precautions / Restrictions Precautions Precautions: Fall Precaution Comments: sling for comfort, Rt UE ROM as tolerated, WBAT Rt UE  Required Braces or Orthoses: Sling (for comfort) Restrictions Weight Bearing Restrictions: Yes RUE Weight Bearing: Weight bearing as tolerated      Mobility  Bed Mobility Overal bed mobility: Needs Assistance Bed Mobility: Supine to Sit;Sit to Supine     Supine to sit: HOB elevated;Mod assist Sit to supine: Mod assist   General bed mobility comments: pt requires physical assist to initiate movement of L LE on and off bed and to lift trunk   Transfers                 General  transfer comment: not assessed at this time secondary to elevated BP   Ambulation/Gait                Stairs            Wheelchair Mobility    Modified Rankin (Stroke Patients Only) Modified Rankin (Stroke Patients Only) Pre-Morbid Rankin Score: Moderately severe disability Modified Rankin: Moderately severe disability     Balance Overall balance assessment: Needs assistance;History of Falls Sitting-balance support: Feet unsupported Sitting balance-Leahy Scale: Poor Sitting balance - Comments: pt sitting EOB, required min A to maintain upright sitting posture                                      Pertinent Vitals/Pain Pain Assessment: Faces Faces Pain Scale: Hurts little more Pain Location: generalized Pain Descriptors / Indicators: Grimacing Pain Intervention(s): Monitored during session;Repositioned    Home Living Family/patient expects to be discharged to:: Private residence Living Arrangements: Alone Available Help at Discharge: Available 24 hours/day;Family Type of Home: House Home Access: Level entry     Home Layout: One level Home Equipment: Environmental consultant - 2 wheels;Wheelchair - manual;Hospital bed Additional Comments: Pt reports her walker is in disrepair and she needs a new one     Prior Function Level of Independence: Needs assistance   Gait / Transfers Assistance Needed: pt ambulates with use of RW, but frequently falls  ADL's / [employer]  Needed: Pt reports she needs intermittent assist with ADLs.  She requires assist for IADLs         Hand Dominance   Dominant Hand: Left    Extremity/Trunk Assessment   Upper Extremity Assessment: Defer to OT evaluation RUE Deficits / Details: New Rt shoulder fx limiting Rt shoulder ROM.  ELbow distally with full ROM - generalized weakness     LUE Deficits / Details: Lt UE with tremor/jerky movement.  Shoulder ROM to ~90*.  She demonstrates difficulty touching her head due to  incoordination.  Flexor synergies present, but she is able to move through them with effort    Lower Extremity Assessment: LLE deficits/detail   LLE Deficits / Details: pt with sensation impairment in foot and ankle. Standardized MMT not performed at this time secondary to pt's elevated BP and HR above safe parameters noted in MD's orders (BP in sitting = 141/116 mmHg; HR in sitting = 123 bpm)  Cervical / Trunk Assessment: Kyphotic  Communication   Communication: No difficulties  Cognition Arousal/Alertness: Awake/alert Behavior During Therapy: WFL for tasks assessed/performed Overall Cognitive Status: History of cognitive impairments - at baseline                      General Comments General comments (skin integrity, edema, etc.): Session very limited secondary to elevated BP (141/116 mmHg) and HR (123bpm) in sitting    Exercises     Assessment/Plan    PT Assessment Patient needs continued PT services  PT Problem List Decreased strength;Decreased range of motion;Decreased activity tolerance;Decreased balance;Decreased mobility;Decreased coordination;Decreased knowledge of use of DME;Decreased cognition;Decreased safety awareness;Cardiopulmonary status limiting activity;Pain          PT Treatment Interventions DME instruction;Gait training;Stair training;Functional mobility training;Therapeutic activities;Therapeutic exercise;Balance training;Neuromuscular re-education;Patient/family education    PT Goals (Current goals can be found in the Care Plan section)  Acute Rehab PT Goals Patient Stated Goal: to get stronger  PT Goal Formulation: With patient Time For Goal Achievement: 11/10/16 Potential to Achieve Goals: Good    Frequency Min 3X/week   Barriers to discharge        Co-evaluation PT/OT/SLP Co-Evaluation/Treatment: Yes Reason for Co-Treatment: For patient/therapist safety PT goals addressed during session: Mobility/safety with mobility;Balance          End of Session   Activity Tolerance: Other (comment) (limited secondary to elevated HR and BP) Patient left: in bed;with call bell/phone within reach;with nursing/sitter in room;Other (comment) (transport present to take pt to a procedure) Nurse Communication: Mobility status         Time: 8756-4332 PT Time Calculation (min) (ACUTE ONLY): 15 min   Charges:   PT Evaluation $PT Eval Moderate Complexity: 1 Procedure     PT G CodesAlessandra Bevels Dianelys Scinto 10/27/2016, 5:01 PM Deborah Chalk, PT, DPT [phone]

## 2016-10-27 NOTE — Progress Notes (Signed)
Inpatient Rehabilitation  Patient was screened by Fae Pippin for appropriateness for an Inpatient Acute Rehab consult.  At this time we are recommending an Inpatient Rehab consult.  Md paged to request order.    Charlane Ferretti., CCC/SLP Admission Coordinator  [facility] Inpatient Rehabilitation  Cell [phone]

## 2016-10-27 NOTE — Progress Notes (Signed)
[facility] PROGRESS NOTE  Amber [name] UUV:[id number] DOB: 1955/12/09 DOA: 10/25/2016   PCP: Sandi Mariscal, MD     Assessment/Plan: Principal Problem:   Embolic stroke [facility] Active Problems:   Microcytic anemia   Asthma   Diabetes mellitus type 2, controlled ([place])   Essential hypertension   Pulmonary hypertension   Seizure ([place])   New onset atrial fibrillation ([place])   Cerebrovascular accident (CVA) due to embolism ([place])    Amber [name] is a 60 y.o. female with a past medical history significant for HTN, NIDDM, HFpEF, seizures, chronic pain, and asthma on chronic prednisone who presents with arm shaking today.  The patient was in her usual health until today she woke (from a nap?) with new left arm shaking/tremor.  This persisted through the day, so she had her family member call 9-1-1.  At no point did she lose consciousness, have generalized shaking, fall.  CT head showed a possible R frontal lobe infarct and the patient was loaded with Keppra.  Assessment and plan Embolic CVA Followed by neurology, they recommend TEE Hypercoagulable workup  MRI  numerous supra and infratentorial bilateral embolic infarcts, largest at right MCA frontal cortical region  MRA head and neck unremarkable  2D Echo  -done results pending   TCD with bubble study - pending  RUE and BLE venous doppler - pending  EKG - pending   TEE to look for embolic source. Arranged with [facility] for 12/8.    LDL 33  HgbA1c pending  UDS positive for opiates and benzodiazepines  Hypercoagulable work up pending (anti--DNA, ANA, cardiolipin antibody, homocystine, beta 2 glycoprotein, lupus anticoagulant, CRP, ESR, HIV, RPR)  VitB12, TSH pending  lovenox for VTE prophylaxis.  Diet NPO time specified    Impacted fracture of the right shoulder,Right glenoid fx Orthopedics consult requested-Dr. Margarito Liner mx   Tachycardia-appears to be sinus EKG from  yesterday pending Continue low-dose metoprolol prn    Tremor:  This is new.  Has a history of seizures.  Seizure doubted per Neurology. -EEG ordered -Continue Keppra, if EEG negative, will discontinue   HTN:  -Permissive hypertension for now, hold metoprolol and furosemide/K -Continue statin   NIDDM:  -Hold metformin -SSI low dose with meals   Chronic pain Patient takes multiple narcotic medications at home  Oxymorphone   Asthma: -Continue chronic prednisone   Anxiety-patient takes Valium at home    DVT prophylaxsis Lovenox  Code Status:  Full code   Family Communication: Discussed in detail with the patient, all imaging results, lab results explained to the patient   Disposition Plan:   1-2 days      Consultants:  Orthopedics  Neurology  Procedures:  None  Antibiotics: Anti-infectives    None         HPI/Subjective: Complaining of right shoulder pain   Objective: Vitals:   10/26/16 1745 10/26/16 2043 10/27/16 0000 10/27/16 0400  BP: (!) 142/111 (!) 145/106 108/82 109/84  Pulse: (!) 147 (!) 139 (!) 105   Resp: _0 Temp: 98.1 F (36.7 C) 97.9 F (36.6 C) 98 F (36.7 C) 97.2 F (36.2 C)  TempSrc: Oral Oral Oral Oral  SpO2: 100% 100% 100% 100%  Weight: 48.9 kg (107 lb 12.9 oz)     Height: 5' (1.524 m)      No intake or output data in the 24 hours ending 10/27/16 0807  Exam:  Examination:  General exam: Appears calm and  comfortable  Respiratory system: Clear to auscultation. Respiratory effort normal. Cardiovascular system: S1 & S2 heard, RRR. No JVD, murmurs, rubs, gallops or clicks. No pedal edema. Gastrointestinal system: Abdomen is nondistended, soft and nontender. No organomegaly or masses felt. Normal bowel sounds heard. Central nervous system: Alert and oriented. No focal neurological deficits. Extremities: Symmetric 5 x 5 power. Skin: No rashes, lesions or ulcers Psychiatry: Judgement and insight appear normal.  Mood & affect appropriate.     Data Reviewed: I have personally reviewed following labs and imaging studies  Micro Results Recent Results (from the past 240 hour(s))  MRSA PCR Screening     Status: None   Collection Time: 10/26/16  7:35 PM  Result Value Ref Range Status   MRSA by PCR NEGATIVE NEGATIVE Final    Comment:        The GeneXpert MRSA Assay (FDA approved for NASAL specimens only), is one component of a comprehensive MRSA colonization surveillance program. It is not intended to diagnose MRSA infection nor to guide or monitor treatment for MRSA infections.     Radiology Reports Dg Shoulder Right  Result Date: 10/26/2016 CLINICAL DATA:  60 y/o F; status post fall with right shoulder pain. EXAM: RIGHT SHOULDER - 2+ VIEW COMPARISON:  05/19/2016 right shoulder radiographs. FINDINGS: The humeral head is seated on the glenoid. There is deformity of the glenoid rim most pronounced superiorly probably representing impacted fracture and there are small calcific densities surrounding the proximal humerus which may represent intra-articular bodies. Clavicle is obscured by the mandible. Chronic right upper posterior lateral rib fractures. IMPRESSION: Deformity of glenoid rim most pronounced superiorly may represent an impacted fracture. No dislocation. Ossific densities surrounding proximal humerus may represent intra-articular bodies. Electronically Signed   By: Kristine Garbe M.D.   On: 10/26/2016 15:11   Ct Head Wo Contrast  Result Date: 10/26/2016 CLINICAL DATA:  Fall 2 weeks ago. Fall backward after walker malfunction. Focal seizure. EXAM: CT HEAD WITHOUT CONTRAST TECHNIQUE: Contiguous axial images were obtained from the base of the skull through the vertex without intravenous contrast. COMPARISON:  Head CT 02/28/2016 FINDINGS: Brain: Focal cortical hypodensity in the posterior right frontal lobe concerning for subacute ischemia. No intracranial hemorrhage. Background  chronic small vessel ischemia is similar. No subdural or extra-axial fluid collection. No hydrocephalus. Vascular: Atherosclerosis of skullbase vasculature without hyperdense vessel or abnormal calcification. Skull: No acute fracture. Stable sclerotic focus in the right frontal bone. Sinuses/Orbits: Paranasal sinuses and mastoid air cells are clear. The visualized orbits are unremarkable. Other: None. IMPRESSION: Findings concerning for subacute ischemia in the posterior right frontal lobe. These results were called by telephone at the time of interpretation on 10/26/2016 at 1:34 am to Dr. Delora Fuel , who verbally acknowledged these results. Electronically Signed   By: Jeb Levering M.D.   On: 10/26/2016 01:34   Mr Jodene Nam Head Wo Contrast  Result Date: 10/26/2016 CLINICAL DATA:  Suspected emboli. Multiple acute strokes. Seizure activity. EXAM: MRA NECK WITHOUT AND WITH CONTRAST MRA HEAD WITHOUT CONTRAST TECHNIQUE: Multiplanar and multiecho pulse sequences of the neck were obtained without and with intravenous contrast. Angiographic images of the neck were obtained using MRA technique without and with intravenous contrast.; Angiographic images of the Circle of Willis were obtained using MRA technique without intravenous contrast. CONTRAST:  92m MULTIHANCE GADOBENATE DIMEGLUMINE 529 MG/ML IV SOLN COMPARISON:  MR brain earlier today. FINDINGS: MRA NECK FINDINGS Marked dolichoectasia that suggesting longstanding hypertension. No visible ascending aortic dissection or proximal great vessel stenosis.  Carotid bifurcations free of disease. No cervical ICA dissection or fibromuscular dysplasia. RIGHT vertebral is the sole contributor to the basilar. No flow-limiting stenosis through the neck or proximal intracranial segments. LEFT vertebral is poorly seen at its origin and there may be a significant ostial stenosis. In the neck the LEFT vertebral ascends in a fairly normal caliber, not as large as the RIGHT.  Intracranially, it terminates in PICA. MRA HEAD FINDINGS Minor non stenotic atheromatous change in the cavernous and supraclinoid ICA segments bilaterally. Minor irregularity the anterior and middle cerebral arteries without flow-limiting proximal stenosis. No MCA branch occlusion. Basilar artery hypoplastic. This is in part due to BILATERAL fetal PCA anatomy. The RIGHT vertebral is the sole contributor to the basilar. LEFT vertebral terminates in PICA. Both PICA ease, and superior cerebellar arteries are patent. Anterior inferior cerebral artery is poorly visualized. Distal MCA and PCA branches are mildly irregular consistent with intracranial atherosclerotic change. No visible saccular aneurysm. IMPRESSION: No proximal intracranial stenosis or dissection. No extracranial carotid bifurcation, proximal ICA, or visible great vessel stenosis. Dominant RIGHT vertebral is the sole contributor to the basilar. LEFT vertebral terminates in PICA. Given the relatively unimpressive appearance of the extracranial and intracranial circulation, a cardiac source of emboli is suspected. No large vessel vasculitic change. Mild irregularity of the distal MCA and PCA branches more suggestive of intracranial atherosclerotic change Electronically Signed   By: Staci Righter M.D.   On: 10/26/2016 16:44   Mr Jodene Nam Neck W Wo Contrast  Result Date: 10/26/2016 CLINICAL DATA:  Suspected emboli. Multiple acute strokes. Seizure activity. EXAM: MRA NECK WITHOUT AND WITH CONTRAST MRA HEAD WITHOUT CONTRAST TECHNIQUE: Multiplanar and multiecho pulse sequences of the neck were obtained without and with intravenous contrast. Angiographic images of the neck were obtained using MRA technique without and with intravenous contrast.; Angiographic images of the Circle of Willis were obtained using MRA technique without intravenous contrast. CONTRAST:  21m MULTIHANCE GADOBENATE DIMEGLUMINE 529 MG/ML IV SOLN COMPARISON:  MR brain earlier today. FINDINGS:  MRA NECK FINDINGS Marked dolichoectasia that suggesting longstanding hypertension. No visible ascending aortic dissection or proximal great vessel stenosis. Carotid bifurcations free of disease. No cervical ICA dissection or fibromuscular dysplasia. RIGHT vertebral is the sole contributor to the basilar. No flow-limiting stenosis through the neck or proximal intracranial segments. LEFT vertebral is poorly seen at its origin and there may be a significant ostial stenosis. In the neck the LEFT vertebral ascends in a fairly normal caliber, not as large as the RIGHT. Intracranially, it terminates in PICA. MRA HEAD FINDINGS Minor non stenotic atheromatous change in the cavernous and supraclinoid ICA segments bilaterally. Minor irregularity the anterior and middle cerebral arteries without flow-limiting proximal stenosis. No MCA branch occlusion. Basilar artery hypoplastic. This is in part due to BILATERAL fetal PCA anatomy. The RIGHT vertebral is the sole contributor to the basilar. LEFT vertebral terminates in PICA. Both PICA ease, and superior cerebellar arteries are patent. Anterior inferior cerebral artery is poorly visualized. Distal MCA and PCA branches are mildly irregular consistent with intracranial atherosclerotic change. No visible saccular aneurysm. IMPRESSION: No proximal intracranial stenosis or dissection. No extracranial carotid bifurcation, proximal ICA, or visible great vessel stenosis. Dominant RIGHT vertebral is the sole contributor to the basilar. LEFT vertebral terminates in PICA. Given the relatively unimpressive appearance of the extracranial and intracranial circulation, a cardiac source of emboli is suspected. No large vessel vasculitic change. Mild irregularity of the distal MCA and PCA branches more suggestive of intracranial atherosclerotic change Electronically  Signed   By: Staci Righter M.D.   On: 10/26/2016 16:44   Mr Brain Wo Contrast  Result Date: 10/26/2016 CLINICAL DATA:  Tremor,  fell on Thanksgiving with head injury. Headache and dizziness. History of diabetes, seizures, hypertension. EXAM: MRI HEAD WITHOUT CONTRAST TECHNIQUE: Multiplanar, multiecho pulse sequences of the brain and surrounding structures were obtained without intravenous contrast. COMPARISON:  CT HEAD October 26, 2016 at 0101 hours FINDINGS: BRAIN: Confluent up to 4.2 cm reduced diffusion RIGHT posterior frontal parietal lobe, about the central sulcus. Corresponding low ADC values and faint T2 hyperintense signal. Numerous sub cm foci of reduced diffusion bilateral frontal, parietal and bilateral occipital lobes. Symmetric reduced diffusion mesial thalamus suggests artery of [place]. Multiple foci of reduced diffusion measure up to 11 mm in the cerebellum with low ADC values. Ventricles and sulci are normal for patient's age. Patchy to confluent supratentorial white matter FLAIR T2 hyperintensities exclusive of the aforementioned abnormalities. No midline shift, mass effect or masses. No abnormal extra-axial fluid collections. VASCULAR: Normal major intracranial vascular flow voids present at skull base. SKULL AND UPPER CERVICAL SPINE: No abnormal sellar expansion. No suspicious calvarial bone marrow signal. Craniocervical junction maintained. Grade 1 C3-4 and C4-5 anterolisthesis. SINUSES/ORBITS: The mastoid air-cells and included paranasal sinuses are well-aerated. The included ocular globes and orbital contents are non-suspicious. OTHER: Patient is edentulous. 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 Findings consistent with central embolic phenomena. Mild to moderate chronic small vessel ischemic disease, advanced for age. Electronically Signed   By: Elon Alas M.D.   On: 10/26/2016 05:19     CBC  Recent Labs Lab 10/26/16 0010  WBC 9.8  HGB 10.4*  HCT 31.6*  PLT 203  MCV  77.8*  MCH 25.6*  MCHC 32.9  RDW 16.4*  LYMPHSABS 3.5  MONOABS 0.7  EOSABS 0.1  BASOSABS 0.0    Chemistries   Recent Labs Lab 10/26/16 0010  NA 142  K 3.4*  CL 112*  CO2 21*  GLUCOSE 87  BUN 13  CREATININE 1.00  CALCIUM 8.4*  AST 41  ALT 29  ALKPHOS 121  BILITOT 0.6   ------------------------------------------------------------------------------------------------------------------ estimated creatinine clearance is 44 mL/min (by C-G formula based on SCr of 1 mg/dL). ------------------------------------------------------------------------------------------------------------------ No results for input(s): HGBA1C in the last 72 hours. ------------------------------------------------------------------------------------------------------------------  Recent Labs  10/27/16 0532  CHOL 105  HDL 61  LDLCALC 33  TRIG 55  CHOLHDL 1.7   ------------------------------------------------------------------------------------------------------------------  Recent Labs  10/27/16 0532  TSH 0.852   ------------------------------------------------------------------------------------------------------------------  Recent Labs  10/27/16 0532  VITAMINB12 549    Coagulation profile No results for input(s): INR, PROTIME in the last 168 hours.  No results for input(s): DDIMER in the last 72 hours.  Cardiac Enzymes No results for input(s): CKMB, TROPONINI, MYOGLOBIN in the last 168 hours.  Invalid input(s): CK ------------------------------------------------------------------------------------------------------------------ Invalid input(s): POCBNP   CBG:  Recent Labs Lab 10/26/16 1221 10/26/16 2146 10/27/16 0007 10/27/16 0459  GLUCAP 75 350* 254* 94       Studies: Dg Shoulder Right  Result Date: 10/26/2016 CLINICAL DATA:  60 y/o F; status post fall with right shoulder pain. EXAM: RIGHT SHOULDER - 2+ VIEW COMPARISON:  05/19/2016 right shoulder radiographs.  FINDINGS: The humeral head is seated on the glenoid. There is deformity of the glenoid rim most pronounced superiorly probably representing impacted fracture and there are small calcific densities surrounding the proximal humerus which may represent  intra-articular bodies. Clavicle is obscured by the mandible. Chronic right upper posterior lateral rib fractures. IMPRESSION: Deformity of glenoid rim most pronounced superiorly may represent an impacted fracture. No dislocation. Ossific densities surrounding proximal humerus may represent intra-articular bodies. Electronically Signed   By: Kristine Garbe M.D.   On: 10/26/2016 15:11   Ct Head Wo Contrast  Result Date: 10/26/2016 CLINICAL DATA:  Fall 2 weeks ago. Fall backward after walker malfunction. Focal seizure. EXAM: CT HEAD WITHOUT CONTRAST TECHNIQUE: Contiguous axial images were obtained from the base of the skull through the vertex without intravenous contrast. COMPARISON:  Head CT 02/28/2016 FINDINGS: Brain: Focal cortical hypodensity in the posterior right frontal lobe concerning for subacute ischemia. No intracranial hemorrhage. Background chronic small vessel ischemia is similar. No subdural or extra-axial fluid collection. No hydrocephalus. Vascular: Atherosclerosis of skullbase vasculature without hyperdense vessel or abnormal calcification. Skull: No acute fracture. Stable sclerotic focus in the right frontal bone. Sinuses/Orbits: Paranasal sinuses and mastoid air cells are clear. The visualized orbits are unremarkable. Other: None. IMPRESSION: Findings concerning for subacute ischemia in the posterior right frontal lobe. These results were called by telephone at the time of interpretation on 10/26/2016 at 1:34 am to Dr. Delora Fuel , who verbally acknowledged these results. Electronically Signed   By: Jeb Levering M.D.   On: 10/26/2016 01:34   Mr Jodene Nam Head Wo Contrast  Result Date: 10/26/2016 CLINICAL DATA:  Suspected emboli. Multiple  acute strokes. Seizure activity. EXAM: MRA NECK WITHOUT AND WITH CONTRAST MRA HEAD WITHOUT CONTRAST TECHNIQUE: Multiplanar and multiecho pulse sequences of the neck were obtained without and with intravenous contrast. Angiographic images of the neck were obtained using MRA technique without and with intravenous contrast.; Angiographic images of the Circle of Willis were obtained using MRA technique without intravenous contrast. CONTRAST:  68m MULTIHANCE GADOBENATE DIMEGLUMINE 529 MG/ML IV SOLN COMPARISON:  MR brain earlier today. FINDINGS: MRA NECK FINDINGS Marked dolichoectasia that suggesting longstanding hypertension. No visible ascending aortic dissection or proximal great vessel stenosis. Carotid bifurcations free of disease. No cervical ICA dissection or fibromuscular dysplasia. RIGHT vertebral is the sole contributor to the basilar. No flow-limiting stenosis through the neck or proximal intracranial segments. LEFT vertebral is poorly seen at its origin and there may be a significant ostial stenosis. In the neck the LEFT vertebral ascends in a fairly normal caliber, not as large as the RIGHT. Intracranially, it terminates in PICA. MRA HEAD FINDINGS Minor non stenotic atheromatous change in the cavernous and supraclinoid ICA segments bilaterally. Minor irregularity the anterior and middle cerebral arteries without flow-limiting proximal stenosis. No MCA branch occlusion. Basilar artery hypoplastic. This is in part due to BILATERAL fetal PCA anatomy. The RIGHT vertebral is the sole contributor to the basilar. LEFT vertebral terminates in PICA. Both PICA ease, and superior cerebellar arteries are patent. Anterior inferior cerebral artery is poorly visualized. Distal MCA and PCA branches are mildly irregular consistent with intracranial atherosclerotic change. No visible saccular aneurysm. IMPRESSION: No proximal intracranial stenosis or dissection. No extracranial carotid bifurcation, proximal ICA, or visible  great vessel stenosis. Dominant RIGHT vertebral is the sole contributor to the basilar. LEFT vertebral terminates in PICA. Given the relatively unimpressive appearance of the extracranial and intracranial circulation, a cardiac source of emboli is suspected. No large vessel vasculitic change. Mild irregularity of the distal MCA and PCA branches more suggestive of intracranial atherosclerotic change Electronically Signed   By: JStaci RighterM.D.   On: 10/26/2016 16:44   Mr MJodene NamNeck W Wo  Contrast  Result Date: 10/26/2016 CLINICAL DATA:  Suspected emboli. Multiple acute strokes. Seizure activity. EXAM: MRA NECK WITHOUT AND WITH CONTRAST MRA HEAD WITHOUT CONTRAST TECHNIQUE: Multiplanar and multiecho pulse sequences of the neck were obtained without and with intravenous contrast. Angiographic images of the neck were obtained using MRA technique without and with intravenous contrast.; Angiographic images of the Circle of Willis were obtained using MRA technique without intravenous contrast. CONTRAST:  12m MULTIHANCE GADOBENATE DIMEGLUMINE 529 MG/ML IV SOLN COMPARISON:  MR brain earlier today. FINDINGS: MRA NECK FINDINGS Marked dolichoectasia that suggesting longstanding hypertension. No visible ascending aortic dissection or proximal great vessel stenosis. Carotid bifurcations free of disease. No cervical ICA dissection or fibromuscular dysplasia. RIGHT vertebral is the sole contributor to the basilar. No flow-limiting stenosis through the neck or proximal intracranial segments. LEFT vertebral is poorly seen at its origin and there may be a significant ostial stenosis. In the neck the LEFT vertebral ascends in a fairly normal caliber, not as large as the RIGHT. Intracranially, it terminates in PICA. MRA HEAD FINDINGS Minor non stenotic atheromatous change in the cavernous and supraclinoid ICA segments bilaterally. Minor irregularity the anterior and middle cerebral arteries without flow-limiting proximal stenosis. No  MCA branch occlusion. Basilar artery hypoplastic. This is in part due to BILATERAL fetal PCA anatomy. The RIGHT vertebral is the sole contributor to the basilar. LEFT vertebral terminates in PICA. Both PICA ease, and superior cerebellar arteries are patent. Anterior inferior cerebral artery is poorly visualized. Distal MCA and PCA branches are mildly irregular consistent with intracranial atherosclerotic change. No visible saccular aneurysm. IMPRESSION: No proximal intracranial stenosis or dissection. No extracranial carotid bifurcation, proximal ICA, or visible great vessel stenosis. Dominant RIGHT vertebral is the sole contributor to the basilar. LEFT vertebral terminates in PICA. Given the relatively unimpressive appearance of the extracranial and intracranial circulation, a cardiac source of emboli is suspected. No large vessel vasculitic change. Mild irregularity of the distal MCA and PCA branches more suggestive of intracranial atherosclerotic change Electronically Signed   By: JStaci RighterM.D.   On: 10/26/2016 16:44   Mr Brain Wo Contrast  Result Date: 10/26/2016 CLINICAL DATA:  Tremor, fell on Thanksgiving with head injury. Headache and dizziness. History of diabetes, seizures, hypertension. EXAM: MRI HEAD WITHOUT CONTRAST TECHNIQUE: Multiplanar, multiecho pulse sequences of the brain and surrounding structures were obtained without intravenous contrast. COMPARISON:  CT HEAD October 26, 2016 at 0101 hours FINDINGS: BRAIN: Confluent up to 4.2 cm reduced diffusion RIGHT posterior frontal parietal lobe, about the central sulcus. Corresponding low ADC values and faint T2 hyperintense signal. Numerous sub cm foci of reduced diffusion bilateral frontal, parietal and bilateral occipital lobes. Symmetric reduced diffusion mesial thalamus suggests artery of [place]. Multiple foci of reduced diffusion measure up to 11 mm in the cerebellum with low ADC values. Ventricles and sulci are normal for patient's age.  Patchy to confluent supratentorial white matter FLAIR T2 hyperintensities exclusive of the aforementioned abnormalities. No midline shift, mass effect or masses. No abnormal extra-axial fluid collections. VASCULAR: Normal major intracranial vascular flow voids present at skull base. SKULL AND UPPER CERVICAL SPINE: No abnormal sellar expansion. No suspicious calvarial bone marrow signal. Craniocervical junction maintained. Grade 1 C3-4 and C4-5 anterolisthesis. SINUSES/ORBITS: The mastoid air-cells and included paranasal sinuses are well-aerated. The included ocular globes and orbital contents are non-suspicious. OTHER: Patient is edentulous. 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 Findings consistent with central  embolic phenomena. Mild to moderate chronic small vessel ischemic disease, advanced for age. Electronically Signed   By: Elon Alas M.D.   On: 10/26/2016 05:19      Lab Results  Component Value Date   HGBA1C 5.5 02/29/2016   HGBA1C 5.8 (H) 11/07/2015   HGBA1C 5.5 07/28/2015   Lab Results  Component Value Date   LDLCALC 33 10/27/2016   CREATININE 1.00 10/26/2016       Scheduled Meds: .  stroke: mapping our early stages of recovery book   Does not apply Once  . aspirin EC  325 mg Oral Daily  . atorvastatin  10 mg Oral Daily  . diazepam  5 mg Oral BID  . enoxaparin (LOVENOX) injection  40 mg Subcutaneous Q24H  . feeding supplement (ENSURE ENLIVE)  237 mL Oral BID BM  . gabapentin  300 mg Oral BID  . insulin aspart  0-5 Units Subcutaneous QHS  . insulin aspart  0-9 Units Subcutaneous TID WC  . levETIRAcetam  500 mg Oral BID  . morphine  30 mg Oral Q12H  . pantoprazole  40 mg Oral BID AC  . polyethylene glycol  17 g Oral BID  . potassium chloride  20 mEq Oral BID WC  . predniSONE  15 mg Oral Q breakfast   Continuous Infusions:   LOS: 1  day    Time spent: >30 MINS    [facility]  [facility] Pager [phone]. If 7PM-7AM, please contact night-coverage at [email or web address], password [facility] 10/27/2016, 8:07 AM  LOS: 1 day

## 2016-10-27 NOTE — Progress Notes (Signed)
STROKE TEAM PROGRESS NOTE   SUBJECTIVE (INTERVAL HISTORY) No family is at bedside. She is still in pain at right shoulder. Orthopedic surgery consultation did not recommend operation. Patient continued to have tachycardia, EKG did not show A. fib this time. TEE scheduled on Monday. Do around, patient has intermittent left arm and hand involuntary small amplitude shaking, will do long-term EEG. Change Keppra to Vimpat.   OBJECTIVE Temp:  [97.2 F (36.2 C)-98.1 F (36.7 C)] 97.2 F (36.2 C) (12/08 0400) Pulse Rate:  [105-155] 105 (12/08 0000) Cardiac Rhythm: Normal sinus rhythm (12/08 0700) Resp:  [11-23] 13 (12/08 0400) BP: (104-145)/(76-111) 109/84 (12/08 0400) SpO2:  [97 %-100 %] 100 % (12/08 0400) Weight:  [48.9 kg (107 lb 12.9 oz)] 48.9 kg (107 lb 12.9 oz) (12/07 1745)  CBC:   Recent Labs Lab 10/26/16 0010  WBC 9.8  NEUTROABS 5.5  HGB 10.4*  HCT 31.6*  MCV 77.8*  PLT 203    Basic Metabolic Panel:   Recent Labs Lab 10/26/16 0010  NA 142  K 3.4*  CL 112*  CO2 21*  GLUCOSE 87  BUN 13  CREATININE 1.00  CALCIUM 8.4*    Lipid Panel:     Component Value Date/Time   CHOL 105 10/27/2016 0532   TRIG 55 10/27/2016 0532   HDL 61 10/27/2016 0532   CHOLHDL 1.7 10/27/2016 0532   VLDL 11 10/27/2016 0532   LDLCALC 33 10/27/2016 0532   HgbA1c:  Lab Results  Component Value Date   HGBA1C 5.5 02/29/2016   Urine Drug Screen:     Component Value Date/Time   LABOPIA POSITIVE (A) 10/26/2016 1750   COCAINSCRNUR NONE DETECTED 10/26/2016 1750   LABBENZ POSITIVE (A) 10/26/2016 1750   AMPHETMU NONE DETECTED 10/26/2016 1750   THCU NONE DETECTED 10/26/2016 1750   LABBARB NONE DETECTED 10/26/2016 1750      IMAGING I have personally reviewed the radiological images below and agree with the radiology interpretations.  Ct Head Wo Contrast 10/26/2016 Findings concerning for subacute ischemia in the posterior right frontal lobe.   Mr Brain Wo Contrast 10/26/2016 Numerous  supra and infratentorial acute infarcts spanning multiple vascular territories, largest in RIGHT frontoparietal lobes corresponding to CT abnormality. Findings consistent with central embolic phenomena. Mild to moderate chronic small vessel ischemic disease, advanced for age.  EEG -  This awake and asleep EEG is mildly abnormal due to mild diffuse slowing of the waking background.  Clinical Correlation of the above findings indicates diffuse cerebral dysfunction that is non-specific in etiology and can be seen with hypoxic/ischemic injury, toxic/metabolic encephalopathies, neurodegenerative disorders, or medication effect.  The absence of epileptiform discharges does not rule out a clinical diagnosis of epilepsy.  Clinical correlation is advised.  Dg Shoulder Right 10/26/2016 Deformity of glenoid rim most pronounced superiorly may represent an impacted fracture. No dislocation. Ossific densities surrounding proximal humerus may represent intra-articular bodies.   Mr [facility] and neck Wo Contrast 10/26/2016 No proximal intracranial stenosis or dissection. No extracranial carotid bifurcation, proximal ICA, or visible great vessel stenosis. Dominant RIGHT vertebral is the sole contributor to the basilar. LEFT vertebral terminates in PICA. Given the relatively unimpressive appearance of the extracranial and intracranial circulation, a cardiac source of emboli is suspected. No large vessel vasculitic change. Mild irregularity of the distal MCA and PCA branches more suggestive of intracranial atherosclerotic change   TCD bubble study - suspect moderate right to left shunt, however due to background noise and patient's snoring, it difficult to be  confirmed. We will do TEE on Monday  LE venous doppler  10/27/2016 No evidence of acute  deep or superficial vein thrombosis in the lower extremities.   RUE venous doppler  10/27/2016 Negative for deep vein thrombosis. Positive for chronic appearing thrombus in a  short segment of the cephalic vein in the upper arm. All other veins appeared open and patent without thrombus.  TTE  Left ventricle: The cavity size was normal. There was mild   concentric hypertrophy. Systolic function was normal. The   estimated ejection fraction was in the range of 60% to 65%. Wall   motion was normal; there were no regional wall motion   abnormalities. There was an increased relative contribution of   atrial contraction to ventricular filling. Doppler parameters are   consistent with abnormal left ventricular relaxation (grade 1   diastolic dysfunction). - Mitral valve: Calcified annulus. Moderate focal calcification of   the anterior leaflet (medial segment(s)). - Pulmonary arteries: Systolic pressure could not be accurately   estimated.  TEE - pending (Monday)    PHYSICAL EXAM  Temp:  [97.2 F (36.2 C)-98.1 F (36.7 C)] 97.2 F (36.2 C) (12/08 0400) Pulse Rate:  [105-155] 105 (12/08 0000) Resp:  [11-23] 13 (12/08 0400) BP: (104-145)/(76-111) 109/84 (12/08 0400) SpO2:  [97 %-100 %] 100 % (12/08 0400) Weight:  [48.9 kg (107 lb 12.9 oz)] 48.9 kg (107 lb 12.9 oz) (12/07 1745)  General - Well nourished, well developed, in acute distress due to right shoulder pain.  Ophthalmologic - Fundi not visualized due to distress.  Cardiovascular - tachycardia and HR at 120s.  Mental Status -  Level of arousal and orientation to time, place, and person were intact. Language including expression, naming, repetition, comprehension was assessed and found intact. Fund of Knowledge was assessed and was intact.  Cranial Nerves II - XII - II - Visual field intact OU. III, IV, VI - Extraocular movements intact. V - Facial sensation intact bilaterally. VII - Facial movement intact bilaterally. VIII - Hearing & vestibular intact bilaterally. X - Palate elevates symmetrically. XI - Chin turning & shoulder shrug intact bilaterally. XII - Tongue protrusion intact.  Motor  Strength - The patient's strength was normal at LUE, RLE, however LLE proximal 5-/5 and distal DF 3+/5 and PF 4/5, RUE distally 5/5 but proximally very painful at shoulder with limited ROM and pronator drift was absent.  Bulk was normal and fasciculations were absent.   Motor Tone - Muscle tone was assessed at the neck and appendages and was normal.  Reflexes - The patient's reflexes were 1+ in all extremities and she had no pathological reflexes.  Sensory - Light touch, temperature/pinprick were assessed and were symmetrical.    Coordination - The patient had normal movements in the left hand with no ataxia or dysmetria.  Tremor was absent.  Gait and Station - not tested   ASSESSMENT/PLAN [name] is a 60 y.o. female with history of HTN, NIDDM, HFpEF, seizures, chronic pain, and asthma on chronic prednisone presenting with new onset L arm tremor. The CT showed posterior right frontal lobe infarct.   She did not receive IV t-PA.   StrokeA:  Multiple bilateral anterior and supratentorial infarcts, embolic secondary to unknown source. Due to unremarkable vessel images, cardioembolic most likely. DVT negative suspected positive PFO. TEE scheduled on Monday. Hypercoagulable work up pending. Feels vasculitis less likely as pt is on steroids daily for asthma/COPD, but still in DDx. Endocarditis also less likely due to  normal WBC and afebrile.   MRI  numerous supra and infratentorial bilateral embolic infarcts, largest at right MCA frontal cortical region  MRA head and neck unremarkable  2D echo EF 60-65%   TCD with bubble study -suspect moderate PFO, however need to be confirmed by TEE  LE and RUE venous doppler - Negative for DVT. Positive for chronic appearing thrombus at cephalic vein.   TEE to look for embolic source. Arranged with [facility] for Monday.  If positive for PFO (patent foramen ovale), check bilateral lower extremity venous dopplers to rule out  DVT as possible source of stroke. (I have made patient NPO after midnight Sunday night). Fridays schedule was full.  LDL - 33  HgbA1c pending  Hypercoagulable work up pending Diet NPO time specified  aspirin 81 prior to admission, now on ASA 325mg . Pt was ordered with therapeutic lovenox initially (not started yet), but was discontinued due to potential right shoulder surgery and pending further stroke work up.   Patient counseled to be compliant with her antithrombotic medications  Ongoing aggressive stroke risk factor management  Therapy recommendations:  pending   Disposition:  pending   Partial seizure  Was witness in ED with left arm shaking  Loaded with keppra 1000mg  IV  EEG showed mild slowing and no seizure  Hx of seizure no on AEDs now, last seizure 01/2014  Still has concern for seizure activity this morning    will do EEG long-term monitoring  Switch Keppra to Vimpat after loading dose  Right shoulder pain  Pt had recent fall  Right shoulder X-ray concerning for fracture  Ortho consult - WBAT, sling for comfort - ROM as tolerated - will treat nonop  Hypertension  Stable  Permissive hypertension (OK if < 220/120) but gradually normalize in 5-7 days  Long-term BP goal normotensive  Hyperlipidemia  Home meds:  lipitor 10, resumed in hospital  LDL 33, goal < 70  Continue statin at discharge  Diabetes  HgbA1c pending, goal < 7.0  SSI  CBG monitoring  Other Stroke Risk Factors  Overweight, Body mass index is 21.05 kg/m., recommend weight loss, diet and exercise as appropriate   Other Active Problems  Chronic pain. Oxymorphone  Asthma on chronic steroids  Hospital day # 1  Marvel Plan, MD PhD Stroke Neurology 10/27/2016 5:54 PM   To contact Stroke Continuity provider, please refer to [email or web address]. After hours, contact General Neurology

## 2016-10-27 NOTE — Evaluation (Signed)
Occupational Therapy Evaluation Patient Details Name: Amber [name] MRN: [id number] DOB: 05-Mar-1956 Today's Date: 10/27/2016    History of Present Illness This 60 y.o. female admitted with new onset Lt UE tremor which was thought to represent partial seizure.  SHe had a fall Thanksgiving head striking her head with LOC.  CT of head showed subacute ischemic infarct posterior Rt frontal lobe.  ALso with Rt shoulder pain xray revealed Rt glenoid fracture.  PMH includes:  Seizures, PNA, HTN, frequent falls   Clinical Impression   Pt admitted with above. She demonstrates the below listed deficits and will benefit from continued OT to maximize safety and independence with BADLs.  Pt presents to OT with impaired ROM, strength and coordination Lt UE, decreased ROM Rt shoulder and pain Rt shoulder, impaired balance, Rt LE weakness and decreased trunk control.  Eval limited by BP 141/116 and HR 123 EOB sitting (RN notified).  SHe requires mod A - max A for ADLs.  Pt eager to improve independence and plans to return home.  Feel she would benefit from CIR to maximize independence and safety as well as reduce risk for readmission - her grand daughter works during the day - feel she should likely be able to progress to mod I at wheelchair level with CIR.       Follow Up Recommendations  CIR    Equipment Recommendations  3 in 1 bedside commode    Recommendations for Other Services Rehab consult     Precautions / Restrictions Precautions Precautions: None Precaution Comments: sling for comfort, Rt UE ROM as tolerated, WBAT Rt UE  Required Braces or Orthoses: Sling (for comfort ) Restrictions Weight Bearing Restrictions: Yes RUE Weight Bearing: Weight bearing as tolerated      Mobility Bed Mobility Overal bed mobility: Needs Assistance Bed Mobility: Sit to Supine       Sit to supine: Mod assist   General bed mobility comments: requires assist to initiate movement of Lt LE off bed and to  lift trunk   Transfers                 General transfer comment: unable to transfer due to elevated BP     Balance Overall balance assessment: Needs assistance Sitting-balance support: Feet supported Sitting balance-Leahy Scale: Poor Sitting balance - Comments: requires min A                                     ADL Overall ADL's : Needs assistance/impaired Eating/Feeding: Set up;Sitting (using Lt UE )   Grooming: Wash/dry hands;Wash/dry face;Brushing hair;Sitting;Minimal assistance;Oral care   Upper Body Bathing: Moderate assistance;Sitting   Lower Body Bathing: Maximal assistance;Bed level   Upper Body Dressing : Maximal assistance;Sitting   Lower Body Dressing: Total assistance;Bed level   Toilet Transfer: Total assistance Toilet Transfer Details (indicate cue type and reason): unable due to elevated BP Toileting- Clothing Manipulation and Hygiene: Total assistance;Bed level         General ADL Comments: Activity limited by elevated BP     Vision Additional Comments: did not assess due to transport arrived to take pt for procedure    Perception Perception Perception Tested?: Yes   Praxis Praxis Praxis tested?: Within functional limits    Pertinent Vitals/Pain       Hand Dominance Left   Extremity/Trunk Assessment Upper Extremity Assessment Upper Extremity Assessment: LUE deficits/detail;RUE deficits/detail RUE Deficits / Details:  New Rt shoulder fx limiting Rt shoulder ROM.  ELbow distally with full ROM - generalized weakness LUE Deficits / Details: Lt UE with tremor/jerky movement.  Shoulder ROM to ~90*.  She demonstrates difficulty touching her head due to incoordination.  Flexor synergies present, but she is able to move through them with effort  LUE Coordination: decreased fine motor;decreased gross motor   Lower Extremity Assessment Lower Extremity Assessment: Defer to PT evaluation   Cervical / Trunk Assessment Cervical /  Trunk Assessment: Kyphotic   Communication Communication Communication: No difficulties   Cognition Arousal/Alertness: Awake/alert Behavior During Therapy: WFL for tasks assessed/performed Overall Cognitive Status: History of cognitive impairments - at baseline (h/o mild memory deficits and decreased judement )                     General Comments       Exercises       Shoulder Instructions      Home Living Family/patient expects to be discharged to:: Private residence Living Arrangements: Alone Available Help at Discharge: Available 24 hours/day;Family Type of Home: House Home Access: Level entry     Home Layout: One level     Bathroom Shower/Tub:  (pt sponge bathes )   Bathroom Toilet: Standard     Home Equipment: Walker - 2 wheels;Wheelchair - manual;Hospital bed   Additional Comments: Pt reports her walker is in disrepair and she needs a new one       Prior Functioning/Environment Level of Independence: Needs assistance  Gait / Transfers Assistance Needed: mod I, but frequent falls ADL's / Homemaking Assistance Needed: Pt reports she needs intermittent assist with ADLs.  She requires assist for IADLs             OT Problem List: Decreased strength;Decreased activity tolerance;Impaired balance (sitting and/or standing);Decreased coordination;Decreased cognition;Decreased safety awareness;Impaired UE functional use   OT Treatment/Interventions: Self-care/ADL training;Neuromuscular education;Therapeutic activities;Cognitive remediation/compensation;Patient/family education;Balance training;DME and/or AE instruction    OT Goals(Current goals can be found in the care plan section) Acute Rehab OT Goals Patient Stated Goal: to get stronger  OT Goal Formulation: With patient Time For Goal Achievement: 11/10/16 Potential to Achieve Goals: Good ADL Goals Pt Will Perform Grooming: with set-up;sitting Pt Will Perform Upper Body Bathing: with min  assist;sitting Pt Will Perform Lower Body Bathing: with min assist;sit to/from stand Pt Will Perform Upper Body Dressing: with supervision;sitting Pt Will Perform Lower Body Dressing: with min assist;sit to/from stand;with adaptive equipment Pt Will Transfer to Toilet: with min assist;ambulating;regular height toilet;bedside commode;grab bars Pt Will Perform Toileting - Clothing Manipulation and hygiene: with min assist;sit to/from stand  OT Frequency: Min 2X/week   Barriers to D/C:            Co-evaluation              End of Session Nurse Communication: Mobility status  Activity Tolerance: Patient tolerated treatment well Patient left: in bed;with call bell/phone within reach;with nursing/sitter in room   Time: 4287-6811 OT Time Calculation (min): 21 min Charges:  OT General Charges $OT Visit: 1 Procedure OT Evaluation $OT Eval Moderate Complexity: 1 Procedure G-Codes:    Apryll Hinkle M 11-22-2016, 3:55 PM

## 2016-10-27 NOTE — Progress Notes (Signed)
LTM day 1 started; all electrodes under 5 kohms. Nurse educated on event button. Dr Dorthea Cove notified.

## 2016-10-27 NOTE — Progress Notes (Signed)
  Echocardiogram 2D Echocardiogram has been performed.  Delcie Roch 10/27/2016, 9:50 AM

## 2016-10-27 NOTE — Progress Notes (Signed)
Patient HR sustaining in 140's-150's and BP 143/117. Paged MD and directed to administer PRN dose of Metoprolol. Dose given and HR reduced into 110's.   Noe Gens, RN

## 2016-10-27 NOTE — Progress Notes (Signed)
    [facility] has been requested to perform a transesophageal echocardiogram on Amber [name] for CVA on 10/30/16 at 2pm.  After careful review of history and examination, the risks and benefits of transesophageal echocardiogram have been explained including risks of esophageal damage, perforation (1:10,000 risk), bleeding, pharyngeal hematoma as well as other potential complications associated with conscious sedation including aspiration, arrhythmia, respiratory failure and death. Alternatives to treatment were discussed, questions were answered. Patient is willing to proceed.   Cline Crock, PA-C  10/27/2016 8:19 AM

## 2016-10-27 NOTE — Progress Notes (Signed)
Preliminary results by tech - Venous Duplex Lower Ext. Completed. No evidence of acute deep or superficial vein thrombosis in the lower extremities. Kolbey Teichert, BS, RDMS, RVT  

## 2016-10-27 NOTE — Care Management Note (Signed)
Case Management Note  Patient Details  Name: Amber [name] MRN: [id number] Date of Birth: [birth year]  Subjective/Objective:    Pt lives with granddaughter who works during the day.  Has son and daughter who live nearby but are unavailable during the day as well.  Per pt, she has personal care services one hour daily to assist with hygiene five days a week but feels she needs more hours.  CM printed DHHS change of status form and will explain needed process to son.  Pt has hospital bed, wheelchair, BSC, tub bench, and walker.  CM discussed d/c options with pt, will qualify for home health therapies if needed. Per CSW, medicaid will not reimburse for therapy @ SNF level.                Expected Discharge Plan:  Home w Home Health Services  In-House Referral:  Clinical Social Work  Discharge planning Services  CM Consult  Status of Service:  In process, will continue to follow  If discussed at Long Length of Stay Meetings, dates discussed:    Additional Comments:  Magdalene River, RN 10/27/2016, 2:16 PM

## 2016-10-28 DIAGNOSIS — I639 Cerebral infarction, unspecified: Secondary | ICD-10-CM | POA: Diagnosis present

## 2016-10-28 LAB — GLUCOSE, CAPILLARY
GLUCOSE-CAPILLARY: 122 mg/dL — AB (ref 65–99)
GLUCOSE-CAPILLARY: 117 mg/dL — AB (ref 65–99)
Glucose-Capillary: 331 mg/dL — ABNORMAL HIGH (ref 65–99)
Glucose-Capillary: 147 mg/dL — ABNORMAL HIGH (ref 65–99)

## 2016-10-28 LAB — ANTI-DNA ANTIBODY, DOUBLE-STRANDED: ds DNA Ab: <1 IU/mL (ref 0–9)

## 2016-10-28 NOTE — Progress Notes (Signed)
[facility] PROGRESS NOTE  Amber [name] IOE:[id number] DOB: 01-24-1956 DOA: 10/25/2016   PCP: Sandi Mariscal, MD     Assessment/Plan: Principal Problem:   Embolic stroke [facility]) Active Problems:   Microcytic anemia   Asthma   Diabetes mellitus type 2, controlled ([place])   Essential hypertension   Pulmonary hypertension   Seizure ([place])   New onset atrial fibrillation ([place])   Cerebrovascular accident (CVA) due to embolism ([place])   Tachycardia   CVA (cerebral vascular accident) ([place])   Amber [name] is a 60 y.o. female with a past medical history significant for HTN, NIDDM, HFpEF, seizures, chronic pain, and asthma on chronic prednisone who presents with arm shaking today,c/c of left arm tremor, which began at about 3 AM on Wednesday. Dr. Roxanne Mins observed the intermittent jerking movements of her left arm and felt that they most likely represented partial seizure activity. She was loaded with 1000 mg Keppra IV.  CT head showed a possible R frontal lobe infarct and the patient was loaded with Keppra.   Assessment and plan Embolic CVA Followed by neurology, they recommend TEE Hypercoagulable workup  MRI  numerous supra and infratentorial bilateral embolic infarcts, largest at right MCA frontal cortical region  MRA head and neck unremarkable  2D Echo  -LV EF: 60% -   65%  TCD with bubble study -  Likely positive Transcranial Doppler Bubble Study indicative of a moderate right to left intracardiac shunt. We will have TEE on Monday to further confirm.  RUE and BLE venous doppler -Positive for chronic appearing thrombus in a short segment of the cephalic vein in the upper arm  EKG - sinus tach  TEE to look for embolic source. Arranged with [facility] for 12/8.    LDL 33  HgbA1c pending  UDS positive for opiates and benzodiazepines  Hypercoagulable work up pending (anti--DNA, ANA, cardiolipin antibody, homocystine, beta 2 glycoprotein, lupus  anticoagulant, are all pending- CRP, ESR, HIV, RPR are all negative)  VitB12, TSH  0.85  lovenox for VTE prophylaxis.        Impacted fracture of the right shoulder,Right glenoid fx Orthopedics consult requested-Dr. Xu,nonsurgical mx   Tachycardia-appears to be sinus EKG wnl, vq scan negative  Continue low-dose metoprolol prn   Seizure Neurology continues to have concerns for seizure activity -EEG ordered  Keppra now changed over to vimpat   HTN:  -Permissive hypertension for now, hold metoprolol and furosemide/K -Continue statin   NIDDM:  -Hold metformin, hemoglobin A1c pending -SSI low dose with meals   Chronic pain Patient takes multiple narcotic medications at home  Oxymorphone, currently receiving MS Contin   Asthma: -Continue chronic prednisone   Anxiety-patient takes Valium at home    DVT prophylaxsis Lovenox  Code Status:  Full code   Family Communication: Discussed in detail with the patient, all imaging results, lab results explained to the patient   Disposition Plan:   1-2 days      Consultants:  Orthopedics  Neurology  Procedures:  None  Antibiotics: Anti-infectives    None         HPI/Subjective: Complaining of right shoulder pain , HR improved   Objective: Vitals:   10/27/16 2300 10/28/16 0000 10/28/16 0100 10/28/16 0400  BP: (!) 143/106 (!) 131/98 (!) 124/107 119/89  Pulse: (!) 110 93 94 90  Resp: '14 12 12 13  '$ Temp:  97.6 F (36.4 C)  98.1 F (36.7 C)  TempSrc:  Oral  Oral  SpO2: 100% 100% 100% 99%  Weight:      Height:        Intake/Output Summary (Last 24 hours) at 10/28/16 0759 Last data filed at 10/28/16 0159  Gross per 24 hour  Intake             1450 ml  Output              451 ml  Net              999 ml    Exam:  Examination:  General exam: Appears calm and comfortable  Respiratory system: Clear to auscultation. Respiratory effort normal. Cardiovascular system: S1 & S2 heard, RRR. No  JVD, murmurs, rubs, gallops or clicks. No pedal edema. Gastrointestinal system: Abdomen is nondistended, soft and nontender. No organomegaly or masses felt. Normal bowel sounds heard. Central nervous system: Alert and oriented. No focal neurological deficits. Extremities: Symmetric 5 x 5 power. Skin: No rashes, lesions or ulcers Psychiatry: Judgement and insight appear normal. Mood & affect appropriate.     Data Reviewed: I have personally reviewed following labs and imaging studies  Micro Results Recent Results (from the past 240 hour(s))  MRSA PCR Screening     Status: None   Collection Time: 10/26/16  7:35 PM  Result Value Ref Range Status   MRSA by PCR NEGATIVE NEGATIVE Final    Comment:        The GeneXpert MRSA Assay (FDA approved for NASAL specimens only), is one component of a comprehensive MRSA colonization surveillance program. It is not intended to diagnose MRSA infection nor to guide or monitor treatment for MRSA infections.     Radiology Reports Dg Chest 2 View  Result Date: 10/27/2016 CLINICAL DATA:  Stroke 2 days ago EXAM: CHEST  2 VIEW COMPARISON:  08/22/2016 FINDINGS: Cardiomegaly again noted. No infiltrate or pleural effusion. No pulmonary edema. Stable left basilar scarring. Metallic fixation rods thoracic spine again noted. Degenerative changes bilateral shoulders. IMPRESSION: No active cardiopulmonary disease. Cardiomegaly again noted. Stable left basilar scarring. Electronically Signed   By: Natasha Mead M.D.   On: 10/27/2016 16:44   Dg Shoulder Right  Result Date: 10/26/2016 CLINICAL DATA:  60 y/o F; status post fall with right shoulder pain. EXAM: RIGHT SHOULDER - 2+ VIEW COMPARISON:  05/19/2016 right shoulder radiographs. FINDINGS: The humeral head is seated on the glenoid. There is deformity of the glenoid rim most pronounced superiorly probably representing impacted fracture and there are small calcific densities surrounding the proximal humerus which may  represent intra-articular bodies. Clavicle is obscured by the mandible. Chronic right upper posterior lateral rib fractures. IMPRESSION: Deformity of glenoid rim most pronounced superiorly may represent an impacted fracture. No dislocation. Ossific densities surrounding proximal humerus may represent intra-articular bodies. Electronically Signed   By: Mitzi Hansen M.D.   On: 10/26/2016 15:11   Ct Head Wo Contrast  Result Date: 10/26/2016 CLINICAL DATA:  Fall 2 weeks ago. Fall backward after walker malfunction. Focal seizure. EXAM: CT HEAD WITHOUT CONTRAST TECHNIQUE: Contiguous axial images were obtained from the base of the skull through the vertex without intravenous contrast. COMPARISON:  Head CT 02/28/2016 FINDINGS: Brain: Focal cortical hypodensity in the posterior right frontal lobe concerning for subacute ischemia. No intracranial hemorrhage. Background chronic small vessel ischemia is similar. No subdural or extra-axial fluid collection. No hydrocephalus. Vascular: Atherosclerosis of skullbase vasculature without hyperdense vessel or abnormal calcification. Skull: No acute fracture. Stable sclerotic focus in the right frontal bone. Sinuses/Orbits:  Paranasal sinuses and mastoid air cells are clear. The visualized orbits are unremarkable. Other: None. IMPRESSION: Findings concerning for subacute ischemia in the posterior right frontal lobe. These results were called by telephone at the time of interpretation on 10/26/2016 at 1:34 am to Dr. Delora Fuel , who verbally acknowledged these results. Electronically Signed   By: Jeb Levering M.D.   On: 10/26/2016 01:34   Mr Jodene Nam Head Wo Contrast  Result Date: 10/26/2016 CLINICAL DATA:  Suspected emboli. Multiple acute strokes. Seizure activity. EXAM: MRA NECK WITHOUT AND WITH CONTRAST MRA HEAD WITHOUT CONTRAST TECHNIQUE: Multiplanar and multiecho pulse sequences of the neck were obtained without and with intravenous contrast. Angiographic images  of the neck were obtained using MRA technique without and with intravenous contrast.; Angiographic images of the Circle of Willis were obtained using MRA technique without intravenous contrast. CONTRAST:  80m MULTIHANCE GADOBENATE DIMEGLUMINE 529 MG/ML IV SOLN COMPARISON:  MR brain earlier today. FINDINGS: MRA NECK FINDINGS Marked dolichoectasia that suggesting longstanding hypertension. No visible ascending aortic dissection or proximal great vessel stenosis. Carotid bifurcations free of disease. No cervical ICA dissection or fibromuscular dysplasia. RIGHT vertebral is the sole contributor to the basilar. No flow-limiting stenosis through the neck or proximal intracranial segments. LEFT vertebral is poorly seen at its origin and there may be a significant ostial stenosis. In the neck the LEFT vertebral ascends in a fairly normal caliber, not as large as the RIGHT. Intracranially, it terminates in PICA. MRA HEAD FINDINGS Minor non stenotic atheromatous change in the cavernous and supraclinoid ICA segments bilaterally. Minor irregularity the anterior and middle cerebral arteries without flow-limiting proximal stenosis. No MCA branch occlusion. Basilar artery hypoplastic. This is in part due to BILATERAL fetal PCA anatomy. The RIGHT vertebral is the sole contributor to the basilar. LEFT vertebral terminates in PICA. Both PICA ease, and superior cerebellar arteries are patent. Anterior inferior cerebral artery is poorly visualized. Distal MCA and PCA branches are mildly irregular consistent with intracranial atherosclerotic change. No visible saccular aneurysm. IMPRESSION: No proximal intracranial stenosis or dissection. No extracranial carotid bifurcation, proximal ICA, or visible great vessel stenosis. Dominant RIGHT vertebral is the sole contributor to the basilar. LEFT vertebral terminates in PICA. Given the relatively unimpressive appearance of the extracranial and intracranial circulation, a cardiac source of  emboli is suspected. No large vessel vasculitic change. Mild irregularity of the distal MCA and PCA branches more suggestive of intracranial atherosclerotic change Electronically Signed   By: JStaci RighterM.D.   On: 10/26/2016 16:44   Mr MJodene NamNeck W Wo Contrast  Result Date: 10/26/2016 CLINICAL DATA:  Suspected emboli. Multiple acute strokes. Seizure activity. EXAM: MRA NECK WITHOUT AND WITH CONTRAST MRA HEAD WITHOUT CONTRAST TECHNIQUE: Multiplanar and multiecho pulse sequences of the neck were obtained without and with intravenous contrast. Angiographic images of the neck were obtained using MRA technique without and with intravenous contrast.; Angiographic images of the Circle of Willis were obtained using MRA technique without intravenous contrast. CONTRAST:  124mMULTIHANCE GADOBENATE DIMEGLUMINE 529 MG/ML IV SOLN COMPARISON:  MR brain earlier today. FINDINGS: MRA NECK FINDINGS Marked dolichoectasia that suggesting longstanding hypertension. No visible ascending aortic dissection or proximal great vessel stenosis. Carotid bifurcations free of disease. No cervical ICA dissection or fibromuscular dysplasia. RIGHT vertebral is the sole contributor to the basilar. No flow-limiting stenosis through the neck or proximal intracranial segments. LEFT vertebral is poorly seen at its origin and there may be a significant ostial stenosis. In the neck the LEFT vertebral ascends  in a fairly normal caliber, not as large as the RIGHT. Intracranially, it terminates in PICA. MRA HEAD FINDINGS Minor non stenotic atheromatous change in the cavernous and supraclinoid ICA segments bilaterally. Minor irregularity the anterior and middle cerebral arteries without flow-limiting proximal stenosis. No MCA branch occlusion. Basilar artery hypoplastic. This is in part due to BILATERAL fetal PCA anatomy. The RIGHT vertebral is the sole contributor to the basilar. LEFT vertebral terminates in PICA. Both PICA ease, and superior cerebellar  arteries are patent. Anterior inferior cerebral artery is poorly visualized. Distal MCA and PCA branches are mildly irregular consistent with intracranial atherosclerotic change. No visible saccular aneurysm. IMPRESSION: No proximal intracranial stenosis or dissection. No extracranial carotid bifurcation, proximal ICA, or visible great vessel stenosis. Dominant RIGHT vertebral is the sole contributor to the basilar. LEFT vertebral terminates in PICA. Given the relatively unimpressive appearance of the extracranial and intracranial circulation, a cardiac source of emboli is suspected. No large vessel vasculitic change. Mild irregularity of the distal MCA and PCA branches more suggestive of intracranial atherosclerotic change Electronically Signed   By: Staci Righter M.D.   On: 10/26/2016 16:44   Mr Brain Wo Contrast  Result Date: 10/26/2016 CLINICAL DATA:  Tremor, fell on Thanksgiving with head injury. Headache and dizziness. History of diabetes, seizures, hypertension. EXAM: MRI HEAD WITHOUT CONTRAST TECHNIQUE: Multiplanar, multiecho pulse sequences of the brain and surrounding structures were obtained without intravenous contrast. COMPARISON:  CT HEAD October 26, 2016 at 0101 hours FINDINGS: BRAIN: Confluent up to 4.2 cm reduced diffusion RIGHT posterior frontal parietal lobe, about the central sulcus. Corresponding low ADC values and faint T2 hyperintense signal. Numerous sub cm foci of reduced diffusion bilateral frontal, parietal and bilateral occipital lobes. Symmetric reduced diffusion mesial thalamus suggests artery of [place]. Multiple foci of reduced diffusion measure up to 11 mm in the cerebellum with low ADC values. Ventricles and sulci are normal for patient's age. Patchy to confluent supratentorial white matter FLAIR T2 hyperintensities exclusive of the aforementioned abnormalities. No midline shift, mass effect or masses. No abnormal extra-axial fluid collections. VASCULAR: Normal major  intracranial vascular flow voids present at skull base. SKULL AND UPPER CERVICAL SPINE: No abnormal sellar expansion. No suspicious calvarial bone marrow signal. Craniocervical junction maintained. Grade 1 C3-4 and C4-5 anterolisthesis. SINUSES/ORBITS: The mastoid air-cells and included paranasal sinuses are well-aerated. The included ocular globes and orbital contents are non-suspicious. OTHER: Patient is edentulous. 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 Findings consistent with central embolic phenomena. Mild to moderate chronic small vessel ischemic disease, advanced for age. Electronically Signed   By: Elon Alas M.D.   On: 10/26/2016 05:19   Nm Pulmonary Perf And Vent  Result Date: 10/27/2016 CLINICAL DATA:  Shortness of breath. EXAM: NUCLEAR MEDICINE VENTILATION - PERFUSION LUNG SCAN TECHNIQUE: Ventilation images were obtained in multiple projections using inhaled aerosol Tc-52mDTPA. Perfusion images were obtained in multiple projections after intravenous injection of Tc-958mAA. RADIOPHARMACEUTICALS:  31.6 mCi Technetium-9951mPA aerosol inhalation and 4.38 mCi Technetium-82m49m IV COMPARISON:  Chest x-ray today and chest CT 05/04/2016 FINDINGS: Ventilation: No focal ventilation defect. Perfusion: No wedge shaped peripheral perfusion defects to suggest acute pulmonary embolism. IMPRESSION: Normal VQ scan without evidence of pulmonary embolism. Electronically Signed   By: DaniMarin Olp.   On: 10/27/2016 16:59     CBC  Recent Labs Lab 10/26/16 0010 10/27/16 0829  WBC 9.8 12.1*  HGB 10.4*  9.9*  HCT 31.6* 29.7*  PLT 203 233  MCV 77.8* 76.2*  MCH 25.6* 25.4*  MCHC 32.9 33.3  RDW 16.4* 16.2*  LYMPHSABS 3.5  --   MONOABS 0.7  --   EOSABS 0.1  --   BASOSABS 0.0  --     Chemistries   Recent Labs Lab 10/26/16 0010 10/27/16 0829  NA 142 139   K 3.4* 4.1  CL 112* 110  CO2 21* 20*  GLUCOSE 87 89  BUN 13 9  CREATININE 1.00 0.89  CALCIUM 8.4* 8.3*  AST 41 23  ALT 29 22  ALKPHOS 121 112  BILITOT 0.6 0.5   ------------------------------------------------------------------------------------------------------------------ estimated creatinine clearance is 49.5 mL/min (by C-G formula based on SCr of 0.89 mg/dL). ------------------------------------------------------------------------------------------------------------------ No results for input(s): HGBA1C in the last 72 hours. ------------------------------------------------------------------------------------------------------------------  Recent Labs  10/27/16 0532  CHOL 105  HDL 61  LDLCALC 33  TRIG 55  CHOLHDL 1.7   ------------------------------------------------------------------------------------------------------------------  Recent Labs  10/27/16 0532  TSH 0.852   ------------------------------------------------------------------------------------------------------------------  Recent Labs  10/27/16 0532  VITAMINB12 549    Coagulation profile No results for input(s): INR, PROTIME in the last 168 hours.  No results for input(s): DDIMER in the last 72 hours.  Cardiac Enzymes No results for input(s): CKMB, TROPONINI, MYOGLOBIN in the last 168 hours.  Invalid input(s): CK ------------------------------------------------------------------------------------------------------------------ Invalid input(s): POCBNP   CBG:  Recent Labs Lab 10/27/16 0459 10/27/16 0816 10/27/16 1220 10/27/16 1718 10/27/16 2158  GLUCAP 94 88 103* 169* 140*       Studies: Dg Chest 2 View  Result Date: 10/27/2016 CLINICAL DATA:  Stroke 2 days ago EXAM: CHEST  2 VIEW COMPARISON:  08/22/2016 FINDINGS: Cardiomegaly again noted. No infiltrate or pleural effusion. No pulmonary edema. Stable left basilar scarring. Metallic fixation rods thoracic spine again noted.  Degenerative changes bilateral shoulders. IMPRESSION: No active cardiopulmonary disease. Cardiomegaly again noted. Stable left basilar scarring. Electronically Signed   By: Lahoma Crocker M.D.   On: 10/27/2016 16:44   Dg Shoulder Right  Result Date: 10/26/2016 CLINICAL DATA:  60 y/o F; status post fall with right shoulder pain. EXAM: RIGHT SHOULDER - 2+ VIEW COMPARISON:  05/19/2016 right shoulder radiographs. FINDINGS: The humeral head is seated on the glenoid. There is deformity of the glenoid rim most pronounced superiorly probably representing impacted fracture and there are small calcific densities surrounding the proximal humerus which may represent intra-articular bodies. Clavicle is obscured by the mandible. Chronic right upper posterior lateral rib fractures. IMPRESSION: Deformity of glenoid rim most pronounced superiorly may represent an impacted fracture. No dislocation. Ossific densities surrounding proximal humerus may represent intra-articular bodies. Electronically Signed   By: Kristine Garbe M.D.   On: 10/26/2016 15:11   Mr Jodene Nam Head Wo Contrast  Result Date: 10/26/2016 CLINICAL DATA:  Suspected emboli. Multiple acute strokes. Seizure activity. EXAM: MRA NECK WITHOUT AND WITH CONTRAST MRA HEAD WITHOUT CONTRAST TECHNIQUE: Multiplanar and multiecho pulse sequences of the neck were obtained without and with intravenous contrast. Angiographic images of the neck were obtained using MRA technique without and with intravenous contrast.; Angiographic images of the Circle of Willis were obtained using MRA technique without intravenous contrast. CONTRAST:  74m MULTIHANCE GADOBENATE DIMEGLUMINE 529 MG/ML IV SOLN COMPARISON:  MR brain earlier today. FINDINGS: MRA NECK FINDINGS Marked dolichoectasia that suggesting longstanding hypertension. No visible ascending aortic dissection or proximal great vessel stenosis. Carotid bifurcations free of disease. No cervical ICA dissection or fibromuscular  dysplasia. RIGHT vertebral is the sole contributor  to the basilar. No flow-limiting stenosis through the neck or proximal intracranial segments. LEFT vertebral is poorly seen at its origin and there may be a significant ostial stenosis. In the neck the LEFT vertebral ascends in a fairly normal caliber, not as large as the RIGHT. Intracranially, it terminates in PICA. MRA HEAD FINDINGS Minor non stenotic atheromatous change in the cavernous and supraclinoid ICA segments bilaterally. Minor irregularity the anterior and middle cerebral arteries without flow-limiting proximal stenosis. No MCA branch occlusion. Basilar artery hypoplastic. This is in part due to BILATERAL fetal PCA anatomy. The RIGHT vertebral is the sole contributor to the basilar. LEFT vertebral terminates in PICA. Both PICA ease, and superior cerebellar arteries are patent. Anterior inferior cerebral artery is poorly visualized. Distal MCA and PCA branches are mildly irregular consistent with intracranial atherosclerotic change. No visible saccular aneurysm. IMPRESSION: No proximal intracranial stenosis or dissection. No extracranial carotid bifurcation, proximal ICA, or visible great vessel stenosis. Dominant RIGHT vertebral is the sole contributor to the basilar. LEFT vertebral terminates in PICA. Given the relatively unimpressive appearance of the extracranial and intracranial circulation, a cardiac source of emboli is suspected. No large vessel vasculitic change. Mild irregularity of the distal MCA and PCA branches more suggestive of intracranial atherosclerotic change Electronically Signed   By: Elsie Stain M.D.   On: 10/26/2016 16:44   Mr Maxine Glenn Neck W Wo Contrast  Result Date: 10/26/2016 CLINICAL DATA:  Suspected emboli. Multiple acute strokes. Seizure activity. EXAM: MRA NECK WITHOUT AND WITH CONTRAST MRA HEAD WITHOUT CONTRAST TECHNIQUE: Multiplanar and multiecho pulse sequences of the neck were obtained without and with intravenous  contrast. Angiographic images of the neck were obtained using MRA technique without and with intravenous contrast.; Angiographic images of the Circle of Willis were obtained using MRA technique without intravenous contrast. CONTRAST:  59mL MULTIHANCE GADOBENATE DIMEGLUMINE 529 MG/ML IV SOLN COMPARISON:  MR brain earlier today. FINDINGS: MRA NECK FINDINGS Marked dolichoectasia that suggesting longstanding hypertension. No visible ascending aortic dissection or proximal great vessel stenosis. Carotid bifurcations free of disease. No cervical ICA dissection or fibromuscular dysplasia. RIGHT vertebral is the sole contributor to the basilar. No flow-limiting stenosis through the neck or proximal intracranial segments. LEFT vertebral is poorly seen at its origin and there may be a significant ostial stenosis. In the neck the LEFT vertebral ascends in a fairly normal caliber, not as large as the RIGHT. Intracranially, it terminates in PICA. MRA HEAD FINDINGS Minor non stenotic atheromatous change in the cavernous and supraclinoid ICA segments bilaterally. Minor irregularity the anterior and middle cerebral arteries without flow-limiting proximal stenosis. No MCA branch occlusion. Basilar artery hypoplastic. This is in part due to BILATERAL fetal PCA anatomy. The RIGHT vertebral is the sole contributor to the basilar. LEFT vertebral terminates in PICA. Both PICA ease, and superior cerebellar arteries are patent. Anterior inferior cerebral artery is poorly visualized. Distal MCA and PCA branches are mildly irregular consistent with intracranial atherosclerotic change. No visible saccular aneurysm. IMPRESSION: No proximal intracranial stenosis or dissection. No extracranial carotid bifurcation, proximal ICA, or visible great vessel stenosis. Dominant RIGHT vertebral is the sole contributor to the basilar. LEFT vertebral terminates in PICA. Given the relatively unimpressive appearance of the extracranial and intracranial  circulation, a cardiac source of emboli is suspected. No large vessel vasculitic change. Mild irregularity of the distal MCA and PCA branches more suggestive of intracranial atherosclerotic change Electronically Signed   By: Elsie Stain M.D.   On: 10/26/2016 16:44   Nm Pulmonary Perf  And Vent  Result Date: 10/27/2016 CLINICAL DATA:  Shortness of breath. EXAM: NUCLEAR MEDICINE VENTILATION - PERFUSION LUNG SCAN TECHNIQUE: Ventilation images were obtained in multiple projections using inhaled aerosol Tc-48mDTPA. Perfusion images were obtained in multiple projections after intravenous injection of Tc-982mAA. RADIOPHARMACEUTICALS:  31.6 mCi Technetium-9952mPA aerosol inhalation and 4.38 mCi Technetium-30m14m IV COMPARISON:  Chest x-ray today and chest CT 05/04/2016 FINDINGS: Ventilation: No focal ventilation defect. Perfusion: No wedge shaped peripheral perfusion defects to suggest acute pulmonary embolism. IMPRESSION: Normal VQ scan without evidence of pulmonary embolism. Electronically Signed   By: DaniMarin Olp.   On: 10/27/2016 16:59      Lab Results  Component Value Date   HGBA1C 5.5 02/29/2016   HGBA1C 5.8 (H) 11/07/2015   HGBA1C 5.5 07/28/2015   Lab Results  Component Value Date   LDLCALC 33 10/27/2016   CREATININE 0.89 10/27/2016       Scheduled Meds: .  stroke: mapping our early stages of recovery book   Does not apply Once  . amLODipine  5 mg Oral Daily  . aspirin EC  325 mg Oral Daily  . atorvastatin  10 mg Oral Daily  . diazepam  5 mg Oral BID  . enoxaparin (LOVENOX) injection  40 mg Subcutaneous Q24H  . feeding supplement (ENSURE ENLIVE)  237 mL Oral BID BM  . gabapentin  300 mg Oral BID  . insulin aspart  0-5 Units Subcutaneous QHS  . insulin aspart  0-9 Units Subcutaneous TID WC  . lacosamide  100 mg Oral BID  . morphine  30 mg Oral Q12H  . pantoprazole  40 mg Oral BID AC  . polyethylene glycol  17 g Oral BID  . predniSONE  15 mg Oral Q breakfast    Continuous Infusions:   LOS: 2 days    Time spent: >30 MINS    [facility] Pager [phone] 7PM-7AM, please contact night-coverage at [email or web address], password [email or web address], 7:59 AM  LOS: 2 days

## 2016-10-28 NOTE — Progress Notes (Signed)
Orthopedic Tech Progress Note Patient Details:  Amber [name] 1956/06/08 [id number]  Ortho Devices Type of Ortho Device: Arm sling Ortho Device/Splint Location: rue Ortho Device/Splint Interventions: Application   Amber [name] 10/28/2016, 11:12 AM

## 2016-10-28 NOTE — Progress Notes (Signed)
STROKE TEAM PROGRESS NOTE   SUBJECTIVE (INTERVAL HISTORY) No family is at bedside. She is still in pain at right shoulder. Waiting for right arm sling. HR improved to 100s. Venous doppler did not see acute DVT but TCD bubble study showed likely moderate PFO. Will do TEE Monday. EEG monitoring set up last night but with very limited recording will continue for today, no seizure seen though. Pt stated that her left UE shaking much better.   OBJECTIVE Temp:  [97.6 F (36.4 C)-98.6 F (37 C)] 97.7 F (36.5 C) (12/09 0900) Pulse Rate:  [90-146] 90 (12/09 0400) Cardiac Rhythm: Sinus tachycardia (12/09 0700) Resp:  [12-22] 13 (12/09 0400) BP: (119-170)/(89-122) 147/106 (12/09 1042) SpO2:  [97 %-100 %] 99 % (12/09 0400)  CBC:   Recent Labs Lab 10/26/16 0010 10/27/16 0829  WBC 9.8 12.1*  NEUTROABS 5.5  --   HGB 10.4* 9.9*  HCT 31.6* 29.7*  MCV 77.8* 76.2*  PLT 203 233    Basic Metabolic Panel:   Recent Labs Lab 10/26/16 0010 10/27/16 0829  NA 142 139  K 3.4* 4.1  CL 112* 110  CO2 21* 20*  GLUCOSE 87 89  BUN 13 9  CREATININE 1.00 0.89  CALCIUM 8.4* 8.3*    Lipid Panel:     Component Value Date/Time   CHOL 105 10/27/2016 0532   TRIG 55 10/27/2016 0532   HDL 61 10/27/2016 0532   CHOLHDL 1.7 10/27/2016 0532   VLDL 11 10/27/2016 0532   LDLCALC 33 10/27/2016 0532   HgbA1c:  Lab Results  Component Value Date   HGBA1C 5.5 02/29/2016   Urine Drug Screen:     Component Value Date/Time   LABOPIA POSITIVE (A) 10/26/2016 1750   COCAINSCRNUR NONE DETECTED 10/26/2016 1750   LABBENZ POSITIVE (A) 10/26/2016 1750   AMPHETMU NONE DETECTED 10/26/2016 1750   THCU NONE DETECTED 10/26/2016 1750   LABBARB NONE DETECTED 10/26/2016 1750      IMAGING I have personally reviewed the radiological images below and agree with the radiology interpretations.  Ct Head Wo Contrast 10/26/2016 Findings concerning for subacute ischemia in the posterior right frontal lobe.   Mr Brain  Wo Contrast 10/26/2016 Numerous supra and infratentorial acute infarcts spanning multiple vascular territories, largest in RIGHT frontoparietal lobes corresponding to CT abnormality. Findings consistent with central embolic phenomena. Mild to moderate chronic small vessel ischemic disease, advanced for age.  EEG -  This awake and asleep EEG is mildly abnormal due to mild diffuse slowing of the waking background.  Clinical Correlation of the above findings indicates diffuse cerebral dysfunction that is non-specific in etiology and can be seen with hypoxic/ischemic injury, toxic/metabolic encephalopathies, neurodegenerative disorders, or medication effect.  The absence of epileptiform discharges does not rule out a clinical diagnosis of epilepsy.  Clinical correlation is advised.  Dg Shoulder Right 10/26/2016 Deformity of glenoid rim most pronounced superiorly may represent an impacted fracture. No dislocation. Ossific densities surrounding proximal humerus may represent intra-articular bodies.   Mr [facility] and neck Wo Contrast 10/26/2016 No proximal intracranial stenosis or dissection. No extracranial carotid bifurcation, proximal ICA, or visible great vessel stenosis. Dominant RIGHT vertebral is the sole contributor to the basilar. LEFT vertebral terminates in PICA. Given the relatively unimpressive appearance of the extracranial and intracranial circulation, a cardiac source of emboli is suspected. No large vessel vasculitic change. Mild irregularity of the distal MCA and PCA branches more suggestive of intracranial atherosclerotic change   TCD bubble study - suspect moderate right to  left shunt, however due to background noise and patient's snoring, lack of window, it difficult to be confirmed. We will do TEE on Monday  LE venous doppler  10/27/2016 No evidence of acute  deep or superficial vein thrombosis in the lower extremities.   RUE venous doppler  10/27/2016 Negative for deep vein  thrombosis. Positive for chronic appearing thrombus in a short segment of the cephalic vein in the upper arm. All other veins appeared open and patent without thrombus.  TTE  Left ventricle: The cavity size was normal. There was mild   concentric hypertrophy. Systolic function was normal. The   estimated ejection fraction was in the range of 60% to 65%. Wall   motion was normal; there were no regional wall motion   abnormalities. There was an increased relative contribution of   atrial contraction to ventricular filling. Doppler parameters are   consistent with abnormal left ventricular relaxation (grade 1   diastolic dysfunction). - Mitral valve: Calcified annulus. Moderate focal calcification of   the anterior leaflet (medial segment(s)). - Pulmonary arteries: Systolic pressure could not be accurately   estimated.  TEE - pending (Monday)    PHYSICAL EXAM  Temp:  [97.6 F (36.4 C)-98.6 F (37 C)] 97.7 F (36.5 C) (12/09 0900) Pulse Rate:  [90-146] 90 (12/09 0400) Resp:  [12-22] 13 (12/09 0400) BP: (119-170)/(89-122) 147/106 (12/09 1042) SpO2:  [97 %-100 %] 99 % (12/09 0400)  General - Well nourished, well developed, in acute distress due to right shoulder pain.  Ophthalmologic - Fundi not visualized due to distress.  Cardiovascular - tachycardia and HR at 100s.  Mental Status -  Level of arousal and orientation to time, place, and person were intact. Language including expression, naming, repetition, comprehension was assessed and found intact. Fund of Knowledge was assessed and was intact.  Cranial Nerves II - XII - II - Visual field intact OU. III, IV, VI - Extraocular movements intact. V - Facial sensation intact bilaterally. VII - Facial movement intact bilaterally. VIII - Hearing & vestibular intact bilaterally. X - Palate elevates symmetrically. XI - Chin turning & shoulder shrug intact bilaterally. XII - Tongue protrusion intact.  Motor Strength - The  patient's strength was normal at LUE, RLE, however LLE proximal 5-/5 and distal DF 3+/5 and PF 4/5, RUE distally 5/5 but proximally very painful at shoulder with limited ROM and pronator drift was absent.  Bulk was normal and fasciculations were absent.   Motor Tone - Muscle tone was assessed at the neck and appendages and was normal.  Reflexes - The patient's reflexes were 1+ in all extremities and she had no pathological reflexes.  Sensory - Light touch, temperature/pinprick were assessed and were symmetrical.    Coordination - The patient had normal movements in the left hand with no ataxia or dysmetria.  Tremor was absent.  Gait and Station - not tested   ASSESSMENT/PLAN [name] is a 60 y.o. female with history of HTN, NIDDM, HFpEF, seizures, chronic pain, and asthma on chronic prednisone presenting with new onset L arm tremor. The CT showed posterior right frontal lobe infarct.   She did not receive IV t-PA.   StrokeA:  Multiple bilateral anterior and supratentorial infarcts, embolic secondary to unknown source. Due to unremarkable vessel images, cardioembolic most likely. DVT negative but TCD bubble study suspected positive PFO. TEE scheduled on Monday. Hypercoagulable work up pending. Feels vasculitis less likely as pt is on steroids daily for asthma/COPD, but still in DDx. Endocarditis  also less likely due to normal WBC and afebrile.   MRI  numerous supra and infratentorial bilateral embolic infarcts, largest at right MCA frontal cortical region  MRA head and neck unremarkable  2D echo EF 60-65%   TCD with bubble study - lack of window and too much noise, had to monitoring via [facility] and [facility], suspect moderate PFO, however need to be confirmed by TEE  LE and RUE venous doppler - Negative for DVT. Positive for chronic appearing thrombus at cephalic vein.   TEE to look for embolic source. Arranged with [facility] for Monday.   LDL - 33  HgbA1c  pending  Hypercoagulable work up pending Diet NPO time specified Except for: Sips with Meds Diet NPO time specified Except for: Sips with Meds Diet Carb Modified Fluid consistency: Thin; Room service appropriate? Yes  aspirin 81 prior to admission, now on ASA 325mg . Pt was ordered with therapeutic lovenox initially (not started yet), but was discontinued pending further stroke work up.   Patient counseled to be compliant with her antithrombotic medications  Ongoing aggressive stroke risk factor management  Therapy recommendations:  CIR recommended -> MD consult order placed.  Disposition:  pending   Partial seizure  Was witness in ED with left arm shaking  Loaded with keppra 1000mg  IV  EEG showed mild slowing and no seizure  Hx of seizure not on AEDs now, last seizure 01/2014  EEG long-term monitoring  On Vimpat after loading dose   Right shoulder pain  Pt had recent fall  Right shoulder X-ray concerning for fracture  Ortho consult - WBAT, sling for comfort - ROM as tolerated - will treat nonop  Currently on arm sling  Hypertension  Stable  Permissive hypertension (OK if < 220/120) but gradually normalize in 5-7 days  Long-term BP goal normotensive  Hyperlipidemia  Home meds:  lipitor 10, resumed in hospital  LDL 33, goal < 70  Continue statin at discharge  Diabetes  HgbA1c pending, goal < 7.0  SSI  CBG monitoring  Other Stroke Risk Factors  Overweight, Body mass index is 21.05 kg/m., recommend weight loss, diet and exercise as appropriate   Other Active Problems  Chronic pain. Oxymorphone  Asthma on chronic steroids  PLAN  Transfer to stroke floor if possible.  Hospital day # 2  , MD PhD Stroke Neurology 10/28/2016 2:01 PM    To contact Stroke Continuity provider, please refer to [facility] Plan. After hours, contact General Neurology

## 2016-10-28 NOTE — Plan of Care (Signed)
Problem: Cardiovascular: Goal: Knowledge of medications will improve Pt started on Norvasc daily for increasing blood pressure   Outcome: Progressing Pt educated on taking b/p regularly while on medication. Pt seemed eager to understand the importance of different b/p readings

## 2016-10-29 DIAGNOSIS — M069 Rheumatoid arthritis, unspecified: Secondary | ICD-10-CM

## 2016-10-29 DIAGNOSIS — I6310 Cerebral infarction due to embolism of unspecified precerebral artery: Secondary | ICD-10-CM

## 2016-10-29 LAB — CBC
HCT: 28.1 % — ABNORMAL LOW (ref 36.0–46.0)
Hemoglobin: 9.3 g/dL — ABNORMAL LOW (ref 12.0–15.0)
MCH: 25.4 pg — AB (ref 26.0–34.0)
MCHC: 33.1 g/dL (ref 30.0–36.0)
MCV: 76.8 fL — AB (ref 78.0–100.0)
PLATELETS: 220 10*3/uL (ref 150–400)
RBC: 3.66 MIL/uL — ABNORMAL LOW (ref 3.87–5.11)
RDW: 15.9 % — AB (ref 11.5–15.5)
WBC: 11.8 10*3/uL — ABNORMAL HIGH (ref 4.0–10.5)

## 2016-10-29 LAB — GLUCOSE, CAPILLARY
GLUCOSE-CAPILLARY: 98 mg/dL (ref 65–99)
GLUCOSE-CAPILLARY: 122 mg/dL — AB (ref 65–99)
Glucose-Capillary: 153 mg/dL — ABNORMAL HIGH (ref 65–99)
Glucose-Capillary: 152 mg/dL — ABNORMAL HIGH (ref 65–99)

## 2016-10-29 LAB — COMPREHENSIVE METABOLIC PANEL
ALT: 24 U/L (ref 14–54)
AST: 33 U/L (ref 15–41)
Albumin: 2.3 g/dL — ABNORMAL LOW (ref 3.5–5.0)
Alkaline Phosphatase: 112 U/L (ref 38–126)
Anion gap: 10 (ref 5–15)
BUN: 12 mg/dL (ref 6–20)
CHLORIDE: 112 mmol/L — AB (ref 101–111)
CO2: 18 mmol/L — ABNORMAL LOW (ref 22–32)
CREATININE: 0.94 mg/dL (ref 0.44–1.00)
Calcium: 8.7 mg/dL — ABNORMAL LOW (ref 8.9–10.3)
GFR calc Af Amer: >60 mL/min (ref >60)
Glucose, Bld: 95 mg/dL (ref 65–99)
POTASSIUM: 4.5 mmol/L (ref 3.5–5.1)
Sodium: 140 mmol/L (ref 135–145)
Total Bilirubin: 0.2 mg/dL — ABNORMAL LOW (ref 0.3–1.2)
Total Protein: 5.3 g/dL — ABNORMAL LOW (ref 6.5–8.1)

## 2016-10-29 LAB — HEMOGLOBIN A1C
Hgb A1c MFr Bld: 4.7 % — ABNORMAL LOW (ref 4.8–5.6)
MEAN PLASMA GLUCOSE: 88 mg/dL

## 2016-10-29 NOTE — Progress Notes (Signed)
[facility] PROGRESS NOTE  Amber [name] NOT:[id number] DOB: 10-05-1956 DOA: 10/25/2016   PCP: Sandi Mariscal, MD     Assessment/Plan: Principal Problem:   Embolic stroke [facility]) Active Problems:   Microcytic anemia   Asthma   Diabetes mellitus type 2, controlled ([place])   Essential hypertension   Pulmonary hypertension   Seizure ([place])   New onset atrial fibrillation ([place])   Cerebrovascular accident (CVA) due to embolism ([place])   Tachycardia   CVA (cerebral vascular accident) ([place])   Amber [name] is a 60 y.o. female with a past medical history significant for HTN, NIDDM, HFpEF, seizures, chronic pain, and asthma on chronic prednisone who presents with arm shaking today,c/c of left arm tremor, which began at about 3 AM on Wednesday. Dr. Roxanne Mins observed the intermittent jerking movements of her left arm and felt that they most likely represented partial seizure activity. She was loaded with 1000 mg Keppra IV.  CT head showed a possible R frontal lobe infarct and the patient was loaded with Keppra.   Assessment and plan Embolic CVA Followed by neurology, they recommend TEE Hypercoagulable workup  MRI  numerous supra and infratentorial bilateral embolic infarcts, largest at right MCA frontal cortical region  MRA head and neck unremarkable  2D Echo  -LV EF: 60% -   65%  TCD with bubble study -  Likely positive Transcranial Doppler Bubble Study indicative of a moderate right to left intracardiac shunt. We will have TEE on Monday to further confirm.  RUE and BLE venous doppler -Positive for chronic appearing thrombus in a short segment of the cephalic vein in the upper arm. Lower extremity DVT negative  EKG - sinus tach  TEE to look for embolic source. Arranged with [facility] for 12/8.    LDL 33  HgbA1c pending  UDS positive for opiates and benzodiazepines  Hypercoagulable work up pending (anti--DNA, ANA, cardiolipin antibody, homocystine,  beta 2 glycoprotein, lupus anticoagulant, are all pending- CRP, ESR, HIV, RPR are all negative)  VitB12, TSH  0.85  lovenox for VTE prophylaxis.   EEG unremarkable  Continue aspirin 325 mg    CIR  Impacted fracture of the right shoulder,Right glenoid fx Orthopedics consult requested-Dr. Xu,nonsurgical mx   Tachycardia-appears to be sinus EKG wnl, vq scan negative  Continue low-dose metoprolol prn   Seizure Neurology continues to have concerns for seizure activity -EEG in progress Loaded with keppra '1000mg'$  IV  Keppra now changed over to vimpat   HTN:  -Permissive hypertension for now, hold metoprolol and furosemide/K -Continue statin   NIDDM:  -Hold metformin, hemoglobin A1c pending -SSI low dose with meals   Chronic pain Patient takes multiple narcotic medications at home  Oxymorphone, currently receiving MS Contin   Asthma: -Continue chronic prednisone   Anxiety-patient takes Valium at home    DVT prophylaxsis Lovenox  Code Status:  Full code   Family Communication: Discussed in detail with the patient, all imaging results, lab results explained to the patient   Disposition Plan:   1-2 days, CIR       Consultants:  Orthopedics  Neurology  Procedures:  None  Antibiotics: Anti-infectives    None         HPI/Subjective: Complaining of pain all over   Objective: Vitals:   10/28/16 1700 10/28/16 2120 10/29/16 0109 10/29/16 0552  BP: (!) 128/95 133/90 (!) 135/93 (!) 156/104  Pulse: (!) 117 (!) 120 98 (!) 116  Resp: 15 18 18  20  Temp:  98.1 F (36.7 C) 97.7 F (36.5 C) 98.2 F (36.8 C)  TempSrc:  Oral Oral Oral  SpO2: 100% 100% 100% 97%  Weight:      Height:        Intake/Output Summary (Last 24 hours) at 10/29/16 0827 Last data filed at 10/28/16 1214  Gross per 24 hour  Intake                0 ml  Output              175 ml  Net             -175 ml    Exam:  Examination:  General exam: Appears calm and  comfortable  Respiratory system: Clear to auscultation. Respiratory effort normal. Cardiovascular system: S1 & S2 heard, RRR. No JVD, murmurs, rubs, gallops or clicks. No pedal edema. Gastrointestinal system: Abdomen is nondistended, soft and nontender. No organomegaly or masses felt. Normal bowel sounds heard. Central nervous system: Alert and oriented. No focal neurological deficits. Extremities: Symmetric 5 x 5 power. Skin: No rashes, lesions or ulcers Psychiatry: Judgement and insight appear normal. Mood & affect appropriate.     Data Reviewed: I have personally reviewed following labs and imaging studies  Micro Results Recent Results (from the past 240 hour(s))  MRSA PCR Screening     Status: None   Collection Time: 10/26/16  7:35 PM  Result Value Ref Range Status   MRSA by PCR NEGATIVE NEGATIVE Final    Comment:        The GeneXpert MRSA Assay (FDA approved for NASAL specimens only), is one component of a comprehensive MRSA colonization surveillance program. It is not intended to diagnose MRSA infection nor to guide or monitor treatment for MRSA infections.     Radiology Reports Dg Chest 2 View  Result Date: 10/27/2016 CLINICAL DATA:  Stroke 2 days ago EXAM: CHEST  2 VIEW COMPARISON:  08/22/2016 FINDINGS: Cardiomegaly again noted. No infiltrate or pleural effusion. No pulmonary edema. Stable left basilar scarring. Metallic fixation rods thoracic spine again noted. Degenerative changes bilateral shoulders. IMPRESSION: No active cardiopulmonary disease. Cardiomegaly again noted. Stable left basilar scarring. Electronically Signed   By: Lahoma Crocker M.D.   On: 10/27/2016 16:44   Dg Shoulder Right  Result Date: 10/26/2016 CLINICAL DATA:  60 y/o F; status post fall with right shoulder pain. EXAM: RIGHT SHOULDER - 2+ VIEW COMPARISON:  05/19/2016 right shoulder radiographs. FINDINGS: The humeral head is seated on the glenoid. There is deformity of the glenoid rim most pronounced  superiorly probably representing impacted fracture and there are small calcific densities surrounding the proximal humerus which may represent intra-articular bodies. Clavicle is obscured by the mandible. Chronic right upper posterior lateral rib fractures. IMPRESSION: Deformity of glenoid rim most pronounced superiorly may represent an impacted fracture. No dislocation. Ossific densities surrounding proximal humerus may represent intra-articular bodies. Electronically Signed   By: Kristine Garbe M.D.   On: 10/26/2016 15:11   Ct Head Wo Contrast  Result Date: 10/26/2016 CLINICAL DATA:  Fall 2 weeks ago. Fall backward after walker malfunction. Focal seizure. EXAM: CT HEAD WITHOUT CONTRAST TECHNIQUE: Contiguous axial images were obtained from the base of the skull through the vertex without intravenous contrast. COMPARISON:  Head CT 02/28/2016 FINDINGS: Brain: Focal cortical hypodensity in the posterior right frontal lobe concerning for subacute ischemia. No intracranial hemorrhage. Background chronic small vessel ischemia is similar. No subdural or extra-axial fluid collection. No hydrocephalus. Vascular:  Atherosclerosis of skullbase vasculature without hyperdense vessel or abnormal calcification. Skull: No acute fracture. Stable sclerotic focus in the right frontal bone. Sinuses/Orbits: Paranasal sinuses and mastoid air cells are clear. The visualized orbits are unremarkable. Other: None. IMPRESSION: Findings concerning for subacute ischemia in the posterior right frontal lobe. These results were called by telephone at the time of interpretation on 10/26/2016 at 1:34 am to Dr. Delora Fuel , who verbally acknowledged these results. Electronically Signed   By: Jeb Levering M.D.   On: 10/26/2016 01:34   Mr Jodene Nam Head Wo Contrast  Result Date: 10/26/2016 CLINICAL DATA:  Suspected emboli. Multiple acute strokes. Seizure activity. EXAM: MRA NECK WITHOUT AND WITH CONTRAST MRA HEAD WITHOUT CONTRAST  TECHNIQUE: Multiplanar and multiecho pulse sequences of the neck were obtained without and with intravenous contrast. Angiographic images of the neck were obtained using MRA technique without and with intravenous contrast.; Angiographic images of the Circle of Willis were obtained using MRA technique without intravenous contrast. CONTRAST:  35m MULTIHANCE GADOBENATE DIMEGLUMINE 529 MG/ML IV SOLN COMPARISON:  MR brain earlier today. FINDINGS: MRA NECK FINDINGS Marked dolichoectasia that suggesting longstanding hypertension. No visible ascending aortic dissection or proximal great vessel stenosis. Carotid bifurcations free of disease. No cervical ICA dissection or fibromuscular dysplasia. RIGHT vertebral is the sole contributor to the basilar. No flow-limiting stenosis through the neck or proximal intracranial segments. LEFT vertebral is poorly seen at its origin and there may be a significant ostial stenosis. In the neck the LEFT vertebral ascends in a fairly normal caliber, not as large as the RIGHT. Intracranially, it terminates in PICA. MRA HEAD FINDINGS Minor non stenotic atheromatous change in the cavernous and supraclinoid ICA segments bilaterally. Minor irregularity the anterior and middle cerebral arteries without flow-limiting proximal stenosis. No MCA branch occlusion. Basilar artery hypoplastic. This is in part due to BILATERAL fetal PCA anatomy. The RIGHT vertebral is the sole contributor to the basilar. LEFT vertebral terminates in PICA. Both PICA ease, and superior cerebellar arteries are patent. Anterior inferior cerebral artery is poorly visualized. Distal MCA and PCA branches are mildly irregular consistent with intracranial atherosclerotic change. No visible saccular aneurysm. IMPRESSION: No proximal intracranial stenosis or dissection. No extracranial carotid bifurcation, proximal ICA, or visible great vessel stenosis. Dominant RIGHT vertebral is the sole contributor to the basilar. LEFT vertebral  terminates in PICA. Given the relatively unimpressive appearance of the extracranial and intracranial circulation, a cardiac source of emboli is suspected. No large vessel vasculitic change. Mild irregularity of the distal MCA and PCA branches more suggestive of intracranial atherosclerotic change Electronically Signed   By: JStaci RighterM.D.   On: 10/26/2016 16:44   Mr MJodene NamNeck W Wo Contrast  Result Date: 10/26/2016 CLINICAL DATA:  Suspected emboli. Multiple acute strokes. Seizure activity. EXAM: MRA NECK WITHOUT AND WITH CONTRAST MRA HEAD WITHOUT CONTRAST TECHNIQUE: Multiplanar and multiecho pulse sequences of the neck were obtained without and with intravenous contrast. Angiographic images of the neck were obtained using MRA technique without and with intravenous contrast.; Angiographic images of the Circle of Willis were obtained using MRA technique without intravenous contrast. CONTRAST:  159mMULTIHANCE GADOBENATE DIMEGLUMINE 529 MG/ML IV SOLN COMPARISON:  MR brain earlier today. FINDINGS: MRA NECK FINDINGS Marked dolichoectasia that suggesting longstanding hypertension. No visible ascending aortic dissection or proximal great vessel stenosis. Carotid bifurcations free of disease. No cervical ICA dissection or fibromuscular dysplasia. RIGHT vertebral is the sole contributor to the basilar. No flow-limiting stenosis through the neck or proximal intracranial segments.  LEFT vertebral is poorly seen at its origin and there may be a significant ostial stenosis. In the neck the LEFT vertebral ascends in a fairly normal caliber, not as large as the RIGHT. Intracranially, it terminates in PICA. MRA HEAD FINDINGS Minor non stenotic atheromatous change in the cavernous and supraclinoid ICA segments bilaterally. Minor irregularity the anterior and middle cerebral arteries without flow-limiting proximal stenosis. No MCA branch occlusion. Basilar artery hypoplastic. This is in part due to BILATERAL fetal PCA anatomy.  The RIGHT vertebral is the sole contributor to the basilar. LEFT vertebral terminates in PICA. Both PICA ease, and superior cerebellar arteries are patent. Anterior inferior cerebral artery is poorly visualized. Distal MCA and PCA branches are mildly irregular consistent with intracranial atherosclerotic change. No visible saccular aneurysm. IMPRESSION: No proximal intracranial stenosis or dissection. No extracranial carotid bifurcation, proximal ICA, or visible great vessel stenosis. Dominant RIGHT vertebral is the sole contributor to the basilar. LEFT vertebral terminates in PICA. Given the relatively unimpressive appearance of the extracranial and intracranial circulation, a cardiac source of emboli is suspected. No large vessel vasculitic change. Mild irregularity of the distal MCA and PCA branches more suggestive of intracranial atherosclerotic change Electronically Signed   By: Staci Righter M.D.   On: 10/26/2016 16:44   Mr Brain Wo Contrast  Result Date: 10/26/2016 CLINICAL DATA:  Tremor, fell on Thanksgiving with head injury. Headache and dizziness. History of diabetes, seizures, hypertension. EXAM: MRI HEAD WITHOUT CONTRAST TECHNIQUE: Multiplanar, multiecho pulse sequences of the brain and surrounding structures were obtained without intravenous contrast. COMPARISON:  CT HEAD October 26, 2016 at 0101 hours FINDINGS: BRAIN: Confluent up to 4.2 cm reduced diffusion RIGHT posterior frontal parietal lobe, about the central sulcus. Corresponding low ADC values and faint T2 hyperintense signal. Numerous sub cm foci of reduced diffusion bilateral frontal, parietal and bilateral occipital lobes. Symmetric reduced diffusion mesial thalamus suggests artery of [place]. Multiple foci of reduced diffusion measure up to 11 mm in the cerebellum with low ADC values. Ventricles and sulci are normal for patient's age. Patchy to confluent supratentorial white matter FLAIR T2 hyperintensities exclusive of the  aforementioned abnormalities. No midline shift, mass effect or masses. No abnormal extra-axial fluid collections. VASCULAR: Normal major intracranial vascular flow voids present at skull base. SKULL AND UPPER CERVICAL SPINE: No abnormal sellar expansion. No suspicious calvarial bone marrow signal. Craniocervical junction maintained. Grade 1 C3-4 and C4-5 anterolisthesis. SINUSES/ORBITS: The mastoid air-cells and included paranasal sinuses are well-aerated. The included ocular globes and orbital contents are non-suspicious. OTHER: Patient is edentulous. 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 Findings consistent with central embolic phenomena. Mild to moderate chronic small vessel ischemic disease, advanced for age. Electronically Signed   By: Elon Alas M.D.   On: 10/26/2016 05:19   Nm Pulmonary Perf And Vent  Result Date: 10/27/2016 CLINICAL DATA:  Shortness of breath. EXAM: NUCLEAR MEDICINE VENTILATION - PERFUSION LUNG SCAN TECHNIQUE: Ventilation images were obtained in multiple projections using inhaled aerosol Tc-66mDTPA. Perfusion images were obtained in multiple projections after intravenous injection of Tc-952mAA. RADIOPHARMACEUTICALS:  31.6 mCi Technetium-9942mPA aerosol inhalation and 4.38 mCi Technetium-8m81m IV COMPARISON:  Chest x-ray today and chest CT 05/04/2016 FINDINGS: Ventilation: No focal ventilation defect. Perfusion: No wedge shaped peripheral perfusion defects to suggest acute pulmonary embolism. IMPRESSION: Normal VQ scan without evidence of pulmonary embolism. Electronically Signed   By: DaniMarin Olp.  On: 10/27/2016 16:59     CBC  Recent Labs Lab 10/26/16 0010 10/27/16 0829 10/29/16 0231  WBC 9.8 12.1* 11.8*  HGB 10.4* 9.9* 9.3*  HCT 31.6* 29.7* 28.1*  PLT 203 233 220  MCV 77.8* 76.2* 76.8*  MCH 25.6* 25.4* 25.4*  MCHC 32.9  33.3 33.1  RDW 16.4* 16.2* 15.9*  LYMPHSABS 3.5  --   --   MONOABS 0.7  --   --   EOSABS 0.1  --   --   BASOSABS 0.0  --   --     Chemistries   Recent Labs Lab 10/26/16 0010 10/27/16 0829 10/29/16 0231  NA 142 139 140  K 3.4* 4.1 4.5  CL 112* 110 112*  CO2 21* 20* 18*  GLUCOSE 87 89 95  BUN '13 9 12  '$ CREATININE 1.00 0.89 0.94  CALCIUM 8.4* 8.3* 8.7*  AST 41 23 33  ALT '29 22 24  '$ ALKPHOS 121 112 112  BILITOT 0.6 0.5 0.2*   ------------------------------------------------------------------------------------------------------------------ estimated creatinine clearance is 46.9 mL/min (by C-G formula based on SCr of 0.94 mg/dL). ------------------------------------------------------------------------------------------------------------------ No results for input(s): HGBA1C in the last 72 hours. ------------------------------------------------------------------------------------------------------------------  Recent Labs  10/27/16 0532  CHOL 105  HDL 61  LDLCALC 33  TRIG 55  CHOLHDL 1.7   ------------------------------------------------------------------------------------------------------------------  Recent Labs  10/27/16 0532  TSH 0.852   ------------------------------------------------------------------------------------------------------------------  Recent Labs  10/27/16 0532  VITAMINB12 549    Coagulation profile No results for input(s): INR, PROTIME in the last 168 hours.  No results for input(s): DDIMER in the last 72 hours.  Cardiac Enzymes No results for input(s): CKMB, TROPONINI, MYOGLOBIN in the last 168 hours.  Invalid input(s): CK ------------------------------------------------------------------------------------------------------------------ Invalid input(s): POCBNP   CBG:  Recent Labs Lab 10/28/16 0830 10/28/16 1218 10/28/16 1743 10/28/16 2129 10/29/16 0600  GLUCAP 122* 117* 331* 147* 98       Studies: Dg Chest 2  View  Result Date: 10/27/2016 CLINICAL DATA:  Stroke 2 days ago EXAM: CHEST  2 VIEW COMPARISON:  08/22/2016 FINDINGS: Cardiomegaly again noted. No infiltrate or pleural effusion. No pulmonary edema. Stable left basilar scarring. Metallic fixation rods thoracic spine again noted. Degenerative changes bilateral shoulders. IMPRESSION: No active cardiopulmonary disease. Cardiomegaly again noted. Stable left basilar scarring. Electronically Signed   By: Lahoma Crocker M.D.   On: 10/27/2016 16:44   Nm Pulmonary Perf And Vent  Result Date: 10/27/2016 CLINICAL DATA:  Shortness of breath. EXAM: NUCLEAR MEDICINE VENTILATION - PERFUSION LUNG SCAN TECHNIQUE: Ventilation images were obtained in multiple projections using inhaled aerosol Tc-18mDTPA. Perfusion images were obtained in multiple projections after intravenous injection of Tc-969mAA. RADIOPHARMACEUTICALS:  31.6 mCi Technetium-9978mPA aerosol inhalation and 4.38 mCi Technetium-6m81m IV COMPARISON:  Chest x-ray today and chest CT 05/04/2016 FINDINGS: Ventilation: No focal ventilation defect. Perfusion: No wedge shaped peripheral perfusion defects to suggest acute pulmonary embolism. IMPRESSION: Normal VQ scan without evidence of pulmonary embolism. Electronically Signed   By: DaniMarin Olp.   On: 10/27/2016 16:59      Lab Results  Component Value Date   HGBA1C 5.5 02/29/2016   HGBA1C 5.8 (H) 11/07/2015   HGBA1C 5.5 07/28/2015   Lab Results  Component Value Date   LDLCALC 33 10/27/2016   CREATININE 0.94 10/29/2016       Scheduled Meds: .  stroke: mapping our early stages of recovery book   Does not apply Once  . amLODipine  5 mg Oral Daily  . aspirin EC  325 mg Oral Daily  . atorvastatin  10 mg Oral Daily  . diazepam  5 mg Oral BID  . enoxaparin (LOVENOX) injection  40 mg Subcutaneous Q24H  . feeding supplement (ENSURE ENLIVE)  237 mL Oral BID BM  . gabapentin  300 mg Oral BID  . insulin aspart  0-5 Units Subcutaneous QHS  . insulin  aspart  0-9 Units Subcutaneous TID WC  . lacosamide  100 mg Oral BID  . morphine  30 mg Oral Q12H  . pantoprazole  40 mg Oral BID AC  . polyethylene glycol  17 g Oral BID  . predniSONE  15 mg Oral Q breakfast   Continuous Infusions:   LOS: 3 days    Time spent: >30 MINS    [facility]  [facility] Pager [phone]. If 7PM-7AM, please contact night-coverage at [email or web address], password [facility] 10/29/2016, 8:27 AM  LOS: 3 days

## 2016-10-29 NOTE — Procedures (Signed)
LTM-EEG Report  HISTORY: Continuous video-EEG monitoring performed for 60 year old with altered mental status.  ACQUISITION: International 10-20 system for electrode placement; 18 channels with additional eyes linked to ipsilateral ears and EKG. Additional T1-T2 electrodes were used. Continuous video recording obtained.   EEG NUMBER: MEDICATIONS:   DAY #1 : from 6226 10/28/16 to 0607 10/29/16   BACKGROUND: An overall medium voltage continuous recording with good spontaneous variability and reactivity. Waking background consisted of a medium voltage 7-8 Hz posterior dominant rhythm bilaterally with low voltage beta activity in the bilateral frontocentral regions and some medium voltage theta activity diffusely. Sleep was captured with some stage II sleep architecture.   Irregular diffuse delta range activity was intermixed with the waking background. Additionally, there were bursts of frontal intermittent rhythmic delta activity during waking and drowsiness.   EPILEPTIFORM/PERIODIC ACTIVITY: Rare, poorly formed isolated sharp waves were seen in the left central regions. SEIZURES: none EVENTS: There were two events of confusion and some shaking of the left arm without electrographic correlate. EKG: no significant arrhythmia  SUMMARY: This was a moderately abnormal continuous video EEG due to some background slowing and diffuse slow activity. Rare isolated sharp waves were seen in the left central regions, suggesting the presence of a diffuse cerebral disturbance with mild focal cortical irritability in the left central regions. Episodes of confusion with left arm shaking did not have an electrographic correlate. The EEG became obscured by electrode artifact in the early morning and was disconnected.

## 2016-10-29 NOTE — Progress Notes (Signed)
vLTM EEG complete. Results pending. No skin breakdown °

## 2016-10-29 NOTE — Consult Note (Signed)
Physical Medicine and Rehabilitation Consult Reason for Consult: Bilateral embolic strokes Referring Phsyician: Amber [name] Amber [name] is an 60 y.o. female.   HPI: Presented with arm shaking on 10/25/2016. Past medical history significant for hypertension, non-insulin-dependent diabetes mellitus, seizures, chronic pain and steroid dependent asthma. MRI of the brain demonstrated numerous supratentorial and infratentorial acute infarcts in multiple vascular territories, largest in the right frontal parietal lobes. Neurology was consult did and felt this likely represented embolic infarcts from the heart. A large PFO was noted on transthoracic echo and a transesophageal echo is scheduled for 10/30/2016. EEG showed diffuse cerebral  dysfunction. No epileptiform discharges. Patient was complaining of right shoulder pain. Fell on the day of admission. Orthopedic consultation with Dr.Xu , right glenoid fracture with recommendations for arm sling, no operative treatment plan, range of motion and weightbearing as tolerated. PT eval on 10/27/2016. Limitation in therapy due to elevated heart rate of 123 beats per minute. Mod assist. Bed mobility noted  Review of Systems - General ROS: negative Psychological ROS: negative ENT ROS: negative Respiratory ROS: no cough, shortness of breath, or wheezing Cardiovascular ROS: no chest pain or dyspnea on exertion Gastrointestinal ROS: positive for - constipation and gas/bloating Genito-Urinary ROS: no dysuria, trouble voiding, or hematuria Musculoskeletal ROS: positive for - gait disturbance, joint pain, joint stiffness, joint swelling, muscle pain and muscular weakness Neurological ROS: positive for - weakness Dermatological ROS: negative  Past Medical History:  Diagnosis Date  . Arthritis   . Asthma   . Chronic pain   . Constipation   . Diabetes mellitus without complication (HCC)   . Falls frequently   . GERD (gastroesophageal reflux disease)   . Hypertension    . Pneumonia 10/2015  . Seizures (HCC)    last seizure March 2015   Past Surgical History:  Procedure Laterality Date  . APPENDECTOMY    . BACK SURGERY    . CHOLECYSTECTOMY     Family History  Problem Relation Age of Onset  . Kidney failure Mother   . Hypertension Sister   . Hypertension Brother    Social History:  reports that she has quit smoking. She has never used smokeless tobacco. She reports that she does not drink alcohol or use drugs. Allergies:  Allergies  Allergen Reactions  . Ivp Dye [Iodinated Diagnostic Agents] Itching   Medications Prior to Admission  Medication Sig Dispense Refill  . albuterol (PROVENTIL HFA;VENTOLIN HFA) 108 (90 BASE) MCG/ACT inhaler Inhale 2 puffs into the lungs every 6 (six) hours as needed for wheezing or shortness of breath.    Marland Kitchen amLODipine (NORVASC) 5 MG tablet Take 5 mg by mouth daily.    Marland Kitchen aspirin EC 81 MG tablet Take 1 tablet (81 mg total) by mouth daily. 30 tablet 2  . atorvastatin (LIPITOR) 10 MG tablet Take 10 mg by mouth daily.  5  . diazepam (VALIUM) 5 MG tablet Take 1 tablet (5 mg total) by mouth 2 (two) times daily. (Patient taking differently: Take 5 mg by mouth every 12 (twelve) hours as needed for anxiety. ) 14 tablet 0  . feeding supplement, ENSURE ENLIVE, (ENSURE ENLIVE) LIQD Take 237 mLs by mouth 2 (two) times daily between meals. 60 Bottle 0  . Fluticasone-Salmeterol (ADVAIR) 250-50 MCG/DOSE AEPB Inhale 1 puff into the lungs 2 (two) times daily.    . folic acid (FOLVITE) 1 MG tablet Take 1 mg by mouth daily.    . furosemide (LASIX) 40 MG tablet Take 1 tablet (40 mg total) by  mouth daily. 30 tablet 1  . gabapentin (NEURONTIN) 300 MG capsule Take 1 capsule (300 mg total) by mouth 2 (two) times daily. (Patient taking differently: Take 300 mg by mouth at bedtime. ) 60 capsule 0  . HYDROcodone-acetaminophen (NORCO/VICODIN) 5-325 MG tablet Take 1 tablet by mouth every 4 (four) hours as needed. (Patient taking differently: Take 1  tablet by mouth every 4 (four) hours as needed for moderate pain. ) 20 tablet 0  . lactulose (CHRONULAC) 10 GM/15ML solution Take 30 mLs (20 g total) by mouth 2 (two) times daily as needed for severe constipation. 240 mL 0  . LINZESS 145 MCG CAPS capsule Take 1 capsule (145 mcg total) by mouth daily as needed (constipation). 30 capsule 0  . metFORMIN (GLUCOPHAGE-XR) 500 MG 24 hr tablet Take 500 mg by mouth daily with breakfast.    . metoprolol succinate (TOPROL-XL) 25 MG 24 hr tablet Take 25 mg by mouth daily.    Marland Kitchen. oxymorphone 20 MG T12A Take 20 mg by mouth every 12 (twelve) hours. 10 tablet 0  . pantoprazole (PROTONIX) 40 MG tablet Take 1 tablet (40 mg total) by mouth 2 (two) times daily before a meal. 60 tablet 3  . polyethylene glycol (MIRALAX / GLYCOLAX) packet Take 17 g by mouth 2 (two) times daily. (Patient taking differently: Take 17 g by mouth daily as needed for mild constipation. ) 14 each 0  . potassium chloride SA (K-DUR,KLOR-CON) 20 MEQ tablet Take 2 tablets (40 mEq total) by mouth 2 (two) times daily. (Patient taking differently: Take 20-40 mEq by mouth 2 (two) times daily as needed (cramping). ) 14 tablet 0  . predniSONE (DELTASONE) 10 MG tablet Take 15 mg by mouth daily with breakfast.    . promethazine (PHENERGAN) 25 MG tablet Take 1 tablet by mouth every 6 (six) hours as needed for nausea or vomiting. nausea  0  . Vitamin D, Ergocalciferol, (DRISDOL) 50000 UNITS CAPS capsule Take 1 capsule (50,000 Units total) by mouth every 7 (seven) days. Sunday (Patient taking differently: Take 50,000 Units by mouth every 7 (seven) days. Monday) 30 capsule 0  . VITAMIN E PO Take 1 capsule by mouth daily.      Home: Home Living Family/patient expects to be discharged to:: Private residence Living Arrangements: Alone Available Help at Discharge: Available 24 hours/day, Family Type of Home: House Home Access: Level entry Home Layout: One level Bathroom Shower/Tub: Other (comment) (sponge  bathes) Bathroom Toilet: Standard Home Equipment: Environmental consultantWalker - 2 wheels, Wheelchair - manual, Hospital bed Additional Comments: Pt reports her walker is in disrepair and she needs a new one   Lives With: Alone (has home health aids)  Functional History: Prior Function Vocation: On disability Functional Status:  Mobility:          ADL: ADL Toilet Transfer Details (indicate cue type and reason): unable due to elevated BP  Cognition: Cognition Overall Cognitive Status: History of cognitive impairments - at baseline Arousal/Alertness: Awake/alert Orientation Level: Oriented X4 Attention: Selective Selective Attention: Appears intact Memory: Impaired Memory Impairment: Retrieval deficit Awareness: Appears intact Problem Solving: Appears intact Safety/Judgment: Impaired Comments: at baseline per son Cognition Arousal/Alertness: Awake/alert Behavior During Therapy: WFL for tasks assessed/performed Overall Cognitive Status: History of cognitive impairments - at baseline  Blood pressure (!) 141/89, pulse 97, temperature 98 F (36.7 C), temperature source Oral, resp. rate 18, height 5' (1.524 m), weight 48.9 kg (107 lb 12.9 oz), SpO2 98 %. Physical Exam  Nursing note and vitals reviewed. Constitutional:  She appears well-developed and well-nourished. No distress.  HENT:  Head: Normocephalic and atraumatic.  Eyes: Conjunctivae and EOM are normal. Pupils are equal, round, and reactive to light.  Neck: Normal range of motion.  Cardiovascular: Normal rate, regular rhythm, normal heart sounds and intact distal pulses.   No murmur heard. Respiratory: Breath sounds normal. No respiratory distress. She has no wheezes.  GI: Soft. Bowel sounds are normal. She exhibits distension. There is no tenderness.  Musculoskeletal:       Right shoulder: She exhibits decreased range of motion.       Left shoulder: She exhibits decreased range of motion.       Thoracic back: She exhibits decreased  range of motion.       Lumbar back: She exhibits decreased range of motion.  Enlarged MCPs bilaterally with ulnar deviation. Evidence of ulnar deviation at the wrist.  Pain with bilateral knee range of motion. No evidence of knee effusion   Neurological:  Motor strength is 4 minus right finger flexors, proximal not tested secondary to arm pain, left upper extremity 4 minus at the grip, biceps, triceps 3 minus at the deltoid due to range of motion restrictions. 3 minus bilateral hip flexor, knee extensor, 4 minus. Ankle dorsiflexors bilaterally.     Results for orders placed or performed during the hospital encounter of 10/25/16 (from the past 24 hour(s))  Glucose, capillary     Status: Abnormal   Collection Time: 10/28/16 12:18 PM  Result Value Ref Range   Glucose-Capillary 117 (H) 65 - 99 mg/dL  Glucose, capillary     Status: Abnormal   Collection Time: 10/28/16  5:43 PM  Result Value Ref Range   Glucose-Capillary 331 (H) 65 - 99 mg/dL  Glucose, capillary     Status: Abnormal   Collection Time: 10/28/16  9:29 PM  Result Value Ref Range   Glucose-Capillary 147 (H) 65 - 99 mg/dL   Comment 1 Notify RN    Comment 2 Document in Chart   CBC     Status: Abnormal   Collection Time: 10/29/16  2:31 AM  Result Value Ref Range   WBC 11.8 (H) 4.0 - 10.5 K/uL   RBC 3.66 (L) 3.87 - 5.11 MIL/uL   Hemoglobin 9.3 (L) 12.0 - 15.0 g/dL   HCT 95.0 (L) 93.2 - 67.1 %   MCV 76.8 (L) 78.0 - 100.0 fL   MCH 25.4 (L) 26.0 - 34.0 pg   MCHC 33.1 30.0 - 36.0 g/dL   RDW 24.5 (H) 80.9 - 98.3 %   Platelets 220 150 - 400 K/uL  Comprehensive metabolic panel     Status: Abnormal   Collection Time: 10/29/16  2:31 AM  Result Value Ref Range   Sodium 140 135 - 145 mmol/L   Potassium 4.5 3.5 - 5.1 mmol/L   Chloride 112 (H) 101 - 111 mmol/L   CO2 18 (L) 22 - 32 mmol/L   Glucose, Bld 95 65 - 99 mg/dL   BUN 12 6 - 20 mg/dL   Creatinine, Ser 3.82 0.44 - 1.00 mg/dL   Calcium 8.7 (L) 8.9 - 10.3 mg/dL   Total  Protein 5.3 (L) 6.5 - 8.1 g/dL   Albumin 2.3 (L) 3.5 - 5.0 g/dL   AST 33 15 - 41 U/L   ALT 24 14 - 54 U/L   Alkaline Phosphatase 112 38 - 126 U/L   Total Bilirubin 0.2 (L) 0.3 - 1.2 mg/dL   GFR calc non Af Amer >60 >60  mL/min   GFR calc Af Amer >60 >60 mL/min   Anion gap 10 5 - 15  Glucose, capillary     Status: None   Collection Time: 10/29/16  6:00 AM  Result Value Ref Range   Glucose-Capillary 98 65 - 99 mg/dL   Comment 1 Notify RN    Comment 2 Document in Chart    Dg Chest 2 View  Result Date: 10/27/2016 CLINICAL DATA:  Stroke 2 days ago EXAM: CHEST  2 VIEW COMPARISON:  08/22/2016 FINDINGS: Cardiomegaly again noted. No infiltrate or pleural effusion. No pulmonary edema. Stable left basilar scarring. Metallic fixation rods thoracic spine again noted. Degenerative changes bilateral shoulders. IMPRESSION: No active cardiopulmonary disease. Cardiomegaly again noted. Stable left basilar scarring. Electronically Signed   By: Natasha Mead M.D.   On: 10/27/2016 16:44   Nm Pulmonary Perf And Vent  Result Date: 10/27/2016 CLINICAL DATA:  Shortness of breath. EXAM: NUCLEAR MEDICINE VENTILATION - PERFUSION LUNG SCAN TECHNIQUE: Ventilation images were obtained in multiple projections using inhaled aerosol Tc-21m DTPA. Perfusion images were obtained in multiple projections after intravenous injection of Tc-30m MAA. RADIOPHARMACEUTICALS:  31.6 mCi Technetium-36m DTPA aerosol inhalation and 4.38 mCi Technetium-58m MAA IV COMPARISON:  Chest x-ray today and chest CT 05/04/2016 FINDINGS: Ventilation: No focal ventilation defect. Perfusion: No wedge shaped peripheral perfusion defects to suggest acute pulmonary embolism. IMPRESSION: Normal VQ scan without evidence of pulmonary embolism. Electronically Signed   By: Elberta Fortis M.D.   On: 10/27/2016 16:59    Assessment/Plan: Diagnosis: Bilateral supra and infratentorial infarcts. Cardioembolic with PFO 1. Does the need for close, 24 hr/day medical  supervision in concert with the patient's rehab needs make it unreasonable for this patient to be served in a less intensive setting? Potentially 2. Co-Morbidities requiring supervision/potential complications: Severe rheumatoid arthritis, right glenoid fracture 3. Due to bladder management, bowel management, safety, skin/wound care, disease management, medication administration, pain management and patient education, does the patient require 24 hr/day rehab nursing? Potentially 4. Does the patient require coordinated care of a physician, rehab nurse, PT (1 hrs/day, 5 days/week), OT (1 hrs/day, 5 days/week) and SLP (.5 hrs/day, 5 days/week) to address physical and functional deficits in the context of the above medical diagnosis(es)? Potentially Addressing deficits in the following areas: balance, endurance, locomotion, strength, transferring, bowel/bladder control, bathing, dressing, feeding, grooming, toileting, cognition, speech, language, swallowing and psychosocial support 5. Can the patient actively participate in an intensive therapy program of at least 3 hrs of therapy per day at least 5 days per week? No 6. The potential for patient to make measurable gains while on inpatient rehab is poor 7. Anticipated functional outcomes upon discharge from inpatients are NA PT, NA OT, NASLP 8. Estimated rehab length of stay to reach the above functional goals is: NA 9. Does the patient have adequate social supports to accommodate these discharge functional goals? N/A 10. Anticipated D/C setting: Other 11. Anticipated post D/C treatments: HH therapy 12. Overall Rehab/Functional Prognosis: fair  RECOMMENDATIONS: This patient's condition is appropriate for continued rehabilitative care in the following setting: SNF Patient has agreed to participate in recommended program. Potentially Note that insurance prior authorization may be required for reimbursement for recommended care.  Comment: Due to severe  RA, chronic pain, history of spinal fusion, right glenoid fracture, as well as recent CVA do not think the patient is able to tolerate therapy. Physical therapy has been held due to tachycardia greater than 120 bpm.   Claudette Laws E 10/29/2016

## 2016-10-29 NOTE — Progress Notes (Signed)
STROKE TEAM PROGRESS NOTE   SUBJECTIVE (INTERVAL HISTORY) No family is at bedside. She is with right arm sling this am. LTM EEG completed however with limited recording. Result pending.  No left arm shaking at rest. HR still 90s to 110s. Pending TEE Monday. Pt seems in depressed mood in crying, and she said she "is scared".    OBJECTIVE Temp:  [97.7 F (36.5 C)-98.5 F (36.9 C)] 98.2 F (36.8 C) (12/10 0552) Pulse Rate:  [98-137] 116 (12/10 0552) Cardiac Rhythm: Sinus tachycardia (12/09 2000) Resp:  [15-20] 20 (12/10 0552) BP: (119-156)/(90-117) 156/104 (12/10 0552) SpO2:  [97 %-100 %] 97 % (12/10 0552)  CBC:   Recent Labs Lab 10/26/16 0010 10/27/16 0829 10/29/16 0231  WBC 9.8 12.1* 11.8*  NEUTROABS 5.5  --   --   HGB 10.4* 9.9* 9.3*  HCT 31.6* 29.7* 28.1*  MCV 77.8* 76.2* 76.8*  PLT 203 233 220    Basic Metabolic Panel:   Recent Labs Lab 10/27/16 0829 10/29/16 0231  NA 139 140  K 4.1 4.5  CL 110 112*  CO2 20* 18*  GLUCOSE 89 95  BUN 9 12  CREATININE 0.89 0.94  CALCIUM 8.3* 8.7*    Lipid Panel:     Component Value Date/Time   CHOL 105 10/27/2016 0532   TRIG 55 10/27/2016 0532   HDL 61 10/27/2016 0532   CHOLHDL 1.7 10/27/2016 0532   VLDL 11 10/27/2016 0532   LDLCALC 33 10/27/2016 0532   HgbA1c:  Lab Results  Component Value Date   HGBA1C 5.5 02/29/2016   Urine Drug Screen:     Component Value Date/Time   LABOPIA POSITIVE (A) 10/26/2016 1750   COCAINSCRNUR NONE DETECTED 10/26/2016 1750   LABBENZ POSITIVE (A) 10/26/2016 1750   AMPHETMU NONE DETECTED 10/26/2016 1750   THCU NONE DETECTED 10/26/2016 1750   LABBARB NONE DETECTED 10/26/2016 1750      IMAGING I have personally reviewed the radiological images below and agree with the radiology interpretations.  Ct Head Wo Contrast 10/26/2016 Findings concerning for subacute ischemia in the posterior right frontal lobe.   Mr Brain Wo Contrast 10/26/2016 Numerous supra and infratentorial acute  infarcts spanning multiple vascular territories, largest in RIGHT frontoparietal lobes corresponding to CT abnormality. Findings consistent with central embolic phenomena. Mild to moderate chronic small vessel ischemic disease, advanced for age.  EEG -  This awake and asleep EEG is mildly abnormal due to mild diffuse slowing of the waking background.  Clinical Correlation of the above findings indicates diffuse cerebral dysfunction that is non-specific in etiology and can be seen with hypoxic/ischemic injury, toxic/metabolic encephalopathies, neurodegenerative disorders, or medication effect.  The absence of epileptiform discharges does not rule out a clinical diagnosis of epilepsy.  Clinical correlation is advised.  Dg Shoulder Right 10/26/2016 Deformity of glenoid rim most pronounced superiorly may represent an impacted fracture. No dislocation. Ossific densities surrounding proximal humerus may represent intra-articular bodies.   Mr [facility] and neck Wo Contrast 10/26/2016 No proximal intracranial stenosis or dissection. No extracranial carotid bifurcation, proximal ICA, or visible great vessel stenosis. Dominant RIGHT vertebral is the sole contributor to the basilar. LEFT vertebral terminates in PICA. Given the relatively unimpressive appearance of the extracranial and intracranial circulation, a cardiac source of emboli is suspected. No large vessel vasculitic change. Mild irregularity of the distal MCA and PCA branches more suggestive of intracranial atherosclerotic change   TCD bubble study - suspect moderate right to left shunt, however due to background noise and  patient's snoring, lack of window, it difficult to be confirmed at this time. We will do TEE on Monday  LE venous doppler  10/27/2016 No evidence of acute  deep or superficial vein thrombosis in the lower extremities.   RUE venous doppler  10/27/2016 Negative for deep vein thrombosis. Positive for chronic appearing thrombus in a  short segment of the cephalic vein in the upper arm. All other veins appeared open and patent without thrombus.  TTE  Left ventricle: The cavity size was normal. There was mild   concentric hypertrophy. Systolic function was normal. The   estimated ejection fraction was in the range of 60% to 65%. Wall   motion was normal; there were no regional wall motion   abnormalities. There was an increased relative contribution of   atrial contraction to ventricular filling. Doppler parameters are   consistent with abnormal left ventricular relaxation (grade 1   diastolic dysfunction). - Mitral valve: Calcified annulus. Moderate focal calcification of   the anterior leaflet (medial segment(s)). - Pulmonary arteries: Systolic pressure could not be accurately   estimated.  TEE - pending (Monday)    PHYSICAL EXAM  Temp:  [97.7 F (36.5 C)-98.5 F (36.9 C)] 98.2 F (36.8 C) (12/10 0552) Pulse Rate:  [98-137] 116 (12/10 0552) Resp:  [15-20] 20 (12/10 0552) BP: (119-156)/(90-117) 156/104 (12/10 0552) SpO2:  [97 %-100 %] 97 % (12/10 0552)  General - Well nourished, well developed, depressed mood in crying.  Ophthalmologic - Fundi not visualized due to distress.  Cardiovascular - tachycardia and HR at 100s.  Mental Status -  Level of arousal and orientation to time, place, and person were intact. Language including expression, naming, repetition, comprehension was assessed and found intact. Fund of Knowledge was assessed and was intact.  Cranial Nerves II - XII - II - Visual field intact OU. III, IV, VI - Extraocular movements intact. V - Facial sensation intact bilaterally. VII - Facial movement intact bilaterally. VIII - Hearing & vestibular intact bilaterally. X - Palate elevates symmetrically. XI - Chin turning & shoulder shrug intact bilaterally. XII - Tongue protrusion intact.  Motor Strength - The patient's strength was normal at LUE, RLE, however LLE proximal 5-/5 and distal  DF 3+/5 and PF 4/5, RUE distally 5/5 but proximally painful shoulder with limited ROM and pronator drift was absent.  Bulk was normal and fasciculations were absent.   Motor Tone - Muscle tone was assessed at the neck and appendages and was normal.  Reflexes - The patient's reflexes were 1+ in all extremities and she had no pathological reflexes.  Sensory - Light touch, temperature/pinprick were assessed and were symmetrical.    Coordination - The patient had normal movements in the left hand with no ataxia or dysmetria.  Tremor was absent.  Gait and Station - not tested   ASSESSMENT/PLAN Amber [name] is a 60 y.o. female with history of HTN, NIDDM, HFpEF, seizures, chronic pain, and asthma on chronic prednisone presenting with new onset L arm tremor. The CT showed posterior right frontal lobe infarct.   She did not receive IV t-PA.   StrokeA:  Multiple bilateral anterior and supratentorial infarcts, embolic secondary to unknown source. Due to unremarkable vessel images, cardioembolic most likely. DVT negative but TCD bubble study suspected positive PFO. TEE scheduled on Monday. Hypercoagulable work up pending. Feels vasculitis less likely as pt is on steroids daily for asthma/COPD, but still in DDx. Endocarditis also less likely due to normal WBC and afebrile.  MRI  numerous supra and infratentorial bilateral embolic infarcts, largest at right MCA frontal cortical region  MRA head and neck unremarkable  2D echo EF 60-65%   TCD with bubble study - lack of window and too much noise, had to monitoring via [facility] and [facility], suspect moderate PFO, however need to be confirmed by TEE  LE and RUE venous doppler - Negative for DVT. Positive for chronic appearing thrombus at cephalic vein.   TEE to look for embolic source. Arranged with [facility] for Monday.   LDL - 33  HgbA1c 4.7  Hypercoagulable work up pending Diet NPO time specified Except for: Sips with  Meds Diet Carb Modified Fluid consistency: Thin; Room service appropriate? Yes  aspirin 81 prior to admission, now on ASA 325mg . Pt was ordered with therapeutic lovenox initially (not started yet), but was discontinued pending further stroke work up.   Patient counseled to be compliant with her antithrombotic medications  Ongoing aggressive stroke risk factor management  Therapy recommendations:  SNF  Disposition:  pending   Partial seizure  Was witness in ED with left arm shaking  Hx of seizure not on AEDs, last seizure 01/2014  Loaded with keppra 1000mg  IV in ER  EEG showed mild slowing and no seizure  Still had intermittent left arm shaking, LTM ordered and result pending  On Vimpat 100mg  bid now  Right shoulder pain  Pt had recent fall  Right shoulder X-ray concerning for fracture  Ortho consult - WBAT, sling for comfort - ROM as tolerated - will treat nonop  Currently on arm sling  Hypertension  Stable  Permissive hypertension (OK if < 220/120) but gradually normalize in 5-7 days  Long-term BP goal normotensive  Hyperlipidemia  Home meds:  lipitor 10, resumed in hospital  LDL 33, goal < 70  Continue statin at discharge  Other Stroke Risk Factors  Overweight, Body mass index is 21.05 kg/m., recommend weight loss, diet and exercise as appropriate   Other Active Problems  Chronic pain. Oxymorphone  Asthma on chronic steroids  Hospital day # 3  02/2014, MD PhD Stroke Neurology 10/29/2016 1:49 PM    To contact Stroke Continuity provider, please refer to . After hours, contact General Neurology

## 2016-10-30 ENCOUNTER — Encounter (HOSPITAL_COMMUNITY)
Admission: EM | Disposition: A | Discharge status: Skilled Nursing Facility | Payer: Self-pay | Source: Home / Self Care | Attending: Internal Medicine

## 2016-10-30 ENCOUNTER — Encounter (HOSPITAL_COMMUNITY): Payer: Self-pay

## 2016-10-30 ENCOUNTER — Inpatient Hospital Stay (HOSPITAL_COMMUNITY): Payer: Medicaid Other

## 2016-10-30 DIAGNOSIS — E1121 Type 2 diabetes mellitus with diabetic nephropathy: Secondary | ICD-10-CM

## 2016-10-30 DIAGNOSIS — I63131 Cerebral infarction due to embolism of right carotid artery: Secondary | ICD-10-CM

## 2016-10-30 DIAGNOSIS — I361 Nonrheumatic tricuspid (valve) insufficiency: Secondary | ICD-10-CM

## 2016-10-30 HISTORY — PX: TEE WITHOUT CARDIOVERSION: SHX5443

## 2016-10-30 LAB — GLUCOSE, CAPILLARY
GLUCOSE-CAPILLARY: 159 mg/dL — AB (ref 65–99)
GLUCOSE-CAPILLARY: 222 mg/dL — AB (ref 65–99)
Glucose-Capillary: 89 mg/dL (ref 65–99)
Glucose-Capillary: 141 mg/dL — ABNORMAL HIGH (ref 65–99)

## 2016-10-30 LAB — LUPUS ANTICOAGULANT PANEL
DRVVT: 40.3 s (ref 0.0–47.0)
PTT LA: 38.4 s (ref 0.0–51.9)

## 2016-10-30 LAB — BETA-2-GLYCOPROTEIN I ABS, IGG/M/A
Beta-2 Glyco I IgG: <9 GPI IgG units (ref 0–20)
Beta-2-Glycoprotein I IgA: <9 GPI IgA units (ref 0–25)
Beta-2-Glycoprotein I IgM: <9 GPI IgM units (ref 0–32)

## 2016-10-30 LAB — ANTINUCLEAR ANTIBODIES, IFA: ANTINUCLEAR ANTIBODIES, IFA: NEGATIVE

## 2016-10-30 LAB — HOMOCYSTEINE: Homocysteine: 20.8 umol/L — ABNORMAL HIGH (ref 0.0–15.0)

## 2016-10-30 SURGERY — ECHOCARDIOGRAM, TRANSESOPHAGEAL
Anesthesia: Moderate Sedation

## 2016-10-30 MED ORDER — FENTANYL CITRATE (PF) 100 MCG/2ML IJ SOLN
INTRAMUSCULAR | Status: AC
  Filled 2016-10-30: qty 2

## 2016-10-30 MED ORDER — METOPROLOL TARTRATE 5 MG/5ML IV SOLN
INTRAVENOUS | Status: DC | PRN
  Administered 2016-10-30: 5 mg via INTRAVENOUS

## 2016-10-30 MED ORDER — BUTAMBEN-TETRACAINE-BENZOCAINE 2-2-14 % EX AERO
INHALATION_SPRAY | CUTANEOUS | Status: DC | PRN
  Administered 2016-10-30: 2 via TOPICAL

## 2016-10-30 MED ORDER — MIDAZOLAM HCL 5 MG/ML IJ SOLN
INTRAMUSCULAR | Status: AC
  Filled 2016-10-30: qty 2

## 2016-10-30 MED ORDER — FENTANYL CITRATE (PF) 100 MCG/2ML IJ SOLN
INTRAMUSCULAR | Status: DC | PRN
  Administered 2016-10-30 (×2): 25 ug via INTRAVENOUS

## 2016-10-30 MED ORDER — MIDAZOLAM HCL 10 MG/2ML IJ SOLN
INTRAMUSCULAR | Status: DC | PRN
  Administered 2016-10-30 (×2): 2 mg via INTRAVENOUS

## 2016-10-30 MED ORDER — SODIUM CHLORIDE 0.9 % IV SOLN
INTRAVENOUS | Status: DC
  Administered 2016-10-30: 500 mL via INTRAVENOUS

## 2016-10-30 NOTE — Interval H&P Note (Signed)
History and Physical Interval Note:  10/30/2016 11:53 AM  Amber [name]  has presented today for surgery, with the diagnosis of STROKE  The various methods of treatment have been discussed with the patient and family. After consideration of risks, benefits and other options for treatment, the patient has consented to  Procedure(s): TRANSESOPHAGEAL ECHOCARDIOGRAM (TEE) (N/A) as a surgical intervention .  The patient's history has been reviewed, patient examined, no change in status, stable for surgery.  I have reviewed the patient's chart and labs.  Questions were answered to the patient's satisfaction.     Tobias Alexander

## 2016-10-30 NOTE — Progress Notes (Signed)
PT Cancellation Note  Patient Details Name: Chisom Muntean MRN: [id number] DOB: 09-09-1956   Cancelled Treatment:    Reason Eval/Treat Not Completed: Patient at procedure or test/unavailable.  Patient off floor for TEE.  Will return later today as time permits.   Vena Austria 10/30/2016, 2:42 PM Durenda Hurt Renaldo Fiddler, [facility] Acute Re[facility] Services Pager [phone]

## 2016-10-30 NOTE — Progress Notes (Signed)
STROKE TEAM PROGRESS NOTE   SUBJECTIVE (INTERVAL HISTORY) Patient up in gerichair at the bedside. For TEE today. No new complaints    OBJECTIVE Temp:  [97.9 F (36.6 C)-98.7 F (37.1 C)] 98.6 F (37 C) (12/11 0900) Pulse Rate:  [98-121] 115 (12/11 0900) Cardiac Rhythm: Sinus tachycardia (12/11 0811) Resp:  [15-20] 18 (12/11 0900) BP: (129-154)/(92-104) 147/94 (12/11 0900) SpO2:  [97 %-100 %] 99 % (12/11 0900)  CBC:   Recent Labs Lab 10/26/16 0010 10/27/16 0829 10/29/16 0231  WBC 9.8 12.1* 11.8*  NEUTROABS 5.5  --   --   HGB 10.4* 9.9* 9.3*  HCT 31.6* 29.7* 28.1*  MCV 77.8* 76.2* 76.8*  PLT 203 233 637    Basic Metabolic Panel:   Recent Labs Lab 10/27/16 0829 10/29/16 0231  NA 139 140  K 4.1 4.5  CL 110 112*  CO2 20* 18*  GLUCOSE 89 95  BUN 9 12  CREATININE 0.89 0.94  CALCIUM 8.3* 8.7*    Lipid Panel:     Component Value Date/Time   CHOL 105 10/27/2016 0532   TRIG 55 10/27/2016 0532   HDL 61 10/27/2016 0532   CHOLHDL 1.7 10/27/2016 0532   VLDL 11 10/27/2016 0532   LDLCALC 33 10/27/2016 0532   HgbA1c:  Lab Results  Component Value Date   HGBA1C 4.7 (L) 10/27/2016   Urine Drug Screen:     Component Value Date/Time   LABOPIA POSITIVE (A) 10/26/2016 1750   COCAINSCRNUR NONE DETECTED 10/26/2016 1750   LABBENZ POSITIVE (A) 10/26/2016 1750   AMPHETMU NONE DETECTED 10/26/2016 1750   THCU NONE DETECTED 10/26/2016 1750   LABBARB NONE DETECTED 10/26/2016 1750      IMAGING I have personally reviewed the radiological images below and agree with the radiology interpretations.  Ct Head Wo Contrast 10/26/2016 Findings concerning for subacute ischemia in the posterior right frontal lobe.   Mr Brain Wo Contrast 10/26/2016 Numerous supra and infratentorial acute infarcts spanning multiple vascular territories, largest in RIGHT frontoparietal lobes corresponding to CT abnormality. Findings consistent with central embolic phenomena. Mild to moderate chronic  small vessel ischemic disease, advanced for age.  EEG -  This awake and asleep EEG is mildly abnormal due to mild diffuse slowing of the waking background.  Clinical Correlation of the above findings indicates diffuse cerebral dysfunction that is non-specific in etiology and can be seen with hypoxic/ischemic injury, toxic/metabolic encephalopathies, neurodegenerative disorders, or medication effect.  The absence of epileptiform discharges does not rule out a clinical diagnosis of epilepsy.  Clinical correlation is advised.  Dg Shoulder Right 10/26/2016 Deformity of glenoid rim most pronounced superiorly may represent an impacted fracture. No dislocation. Ossific densities surrounding proximal humerus may represent intra-articular bodies.   Mr [facility] and neck Wo Contrast 10/26/2016 No proximal intracranial stenosis or dissection. No extracranial carotid bifurcation, proximal ICA, or visible great vessel stenosis. Dominant RIGHT vertebral is the sole contributor to the basilar. LEFT vertebral terminates in PICA. Given the relatively unimpressive appearance of the extracranial and intracranial circulation, a cardiac source of emboli is suspected. No large vessel vasculitic change. Mild irregularity of the distal MCA and PCA branches more suggestive of intracranial atherosclerotic change   TCD bubble study - suspect moderate right to left shunt, however due to background noise and patient's snoring, lack of window, it difficult to be confirmed at this time. We will do TEE on Monday  LE venous doppler  10/27/2016 No evidence of acute  deep or superficial vein thrombosis in  the lower extremities.   RUE venous doppler  10/27/2016 Negative for deep vein thrombosis. Positive for chronic appearing thrombus in a short segment of the cephalic vein in the upper arm. All other veins appeared open and patent without thrombus.  TTE  Left ventricle: The cavity size was normal. There was mild   concentric  hypertrophy. Systolic function was normal. The   estimated ejection fraction was in the range of 60% to 65%. Wall   motion was normal; there were no regional wall motion   abnormalities. There was an increased relative contribution of   atrial contraction to ventricular filling. Doppler parameters are   consistent with abnormal left ventricular relaxation (grade 1   diastolic dysfunction). - Mitral valve: Calcified annulus. Moderate focal calcification of   the anterior leaflet (medial segment(s)). - Pulmonary arteries: Systolic pressure could not be accurately   estimated.  TEE -pending  LT EEG This was a moderately abnormal continuous video EEG due to some background slowing and diffuse slow activity. Rare isolated sharp waves were seen in the left central regions, suggesting the presence of a diffuse cerebral disturbance with mild focal cortical irritability in the left central regions. Episodes of confusion with left arm shaking did not have an electrographic correlate. The EEG became obscured by electrode artifact in the early morning and was disconnected.     PHYSICAL EXAM  Temp:  [97.9 F (36.6 C)-98.7 F (37.1 C)] 98.6 F (37 C) (12/11 0900) Pulse Rate:  [98-121] 115 (12/11 0900) Resp:  [15-20] 18 (12/11 0900) BP: (129-154)/(92-104) 147/94 (12/11 0900) SpO2:  [97 %-100 %] 99 % (12/11 0900)  General - Well nourished, well developed, depressed mood in crying.  Ophthalmologic - Fundi not visualized due to distress.  Cardiovascular - tachycardia and HR at 100s.  Mental Status -  Level of arousal and orientation to time, place, and person were intact. Language including expression, naming, repetition, comprehension was assessed and found intact. Fund of Knowledge was assessed and was intact.  Cranial Nerves II - XII - II - Visual field intact OU. III, IV, VI - Extraocular movements intact. V - Facial sensation intact bilaterally. VII - Facial movement intact  bilaterally. VIII - Hearing & vestibular intact bilaterally. X - Palate elevates symmetrically. XI - Chin turning & shoulder shrug intact bilaterally. XII - Tongue protrusion intact.  Motor Strength - The patient's strength was normal at LUE, RLE, however LLE proximal 5-/5 and distal DF 3+/5 and PF 4/5, RUE distally 5/5 but proximally painful shoulder with limited ROM and pronator drift was absent.  Bulk was normal and fasciculations were absent.   Motor Tone - Muscle tone was assessed at the neck and appendages and was normal.  Reflexes - The patient's reflexes were 1+ in all extremities and she had no pathological reflexes.  Sensory - Light touch, temperature/pinprick were assessed and were symmetrical.    Coordination - The patient had normal movements in the left hand with no ataxia or dysmetria.  Tremor was absent.  Gait and Station - not tested   ASSESSMENT/PLAN [name] is a 60 y.o. female with history of HTN, NIDDM, HFpEF, seizures, chronic pain, and asthma on chronic prednisone presenting with new onset L arm tremor. The CT showed posterior right frontal lobe infarct.   She did not receive IV t-PA.   StrokeA:  Multiple bilateral anterior and supratentorial infarcts, embolic secondary to unknown source. Due to unremarkable vessel images, cardioembolic most likely. DVT negative but TCD bubble study  suspected positive PFO. TEE scheduled on Monday. Hypercoagulable work up pending. Feels vasculitis less likely as pt is on steroids daily for asthma/COPD, but still in DDx. Endocarditis also less likely due to normal WBC and afebrile.   MRI  numerous supra and infratentorial bilateral embolic infarcts, largest at right MCA frontal cortical region  MRA head and neck unremarkable  2D echo EF 60-65%   TCD with bubble study - lack of window and too much noise, had to monitoring via [facility] and [facility], suspect moderate PFO, however need to be confirmed by TEE  LE and RUE venous doppler -  Negative for DVT. Positive for chronic appearing thrombus at cephalic vein.   TEE at 2p today to look for embolic source.   LDL - 33  HgbA1c 4.7  Hypercoagulable work up pending (neg - lupus, anti DNA, ANA, homo, ESR, HIB, RPR, VB12, TSH. Pending - CL ab, beta 2 glyco) Diet NPO time specified Except for: Sips with Meds  aspirin 81 prior to admission, now on ASA '325mg'$ .   Lovenox 40 mg sq daily for VTE prophy  Patient counseled to be compliant with her antithrombotic medications  Ongoing aggressive stroke risk factor management  Therapy recommendations:  SNF  Disposition:  pending   Partial seizure  Was witness in ED with left arm shaking  Hx of seizure not on AEDs, last seizure 01/2014  Loaded with keppra '1000mg'$  IV in ER  On Vimpat '100mg'$  bid now  EEG showed mild slowing and no seizure  Still had intermittent left arm shaking, LTM ordered and result pending   EEG :mild focal cortical irritability in the left central regions  Right shoulder pain  Pt had recent fall  Right shoulder X-ray concerning for fracture  Ortho consult - WBAT, sling for comfort - ROM as tolerated - will treat nonop  Currently Left UE in  arm sling  Hypertension  Stable  Permissive hypertension (OK if < 220/120) but gradually normalize in 5-7 days  Long-term BP goal normotensive  Hyperlipidemia  Home meds:  lipitor 10, resumed in hospital  LDL 33, goal < 70  Continue statin at discharge  Other Stroke Risk Factors  Overweight, Body mass index is 21.05 kg/m., recommend weight loss, diet and exercise as appropriate   Other Active Problems  Chronic pain. Oxymorphone  Asthma on chronic steroids  Hospital day # 4  I have personally examined this patient, reviewed notes, independently viewed imaging studies, participated in medical decision making and plan of care.ROS completed by me personally and pertinent positives fully documented  I have made any additions or clarifications  directly to the above note. Agree with note above. Marland Kitchen Await TEE to confirm right to left intracardiac shunt.  Antony Contras, MD Medical Director [facility] Pager: [phone] 10/30/2016 3:04 PM   To contact Stroke Continuity provider, please refer to [email or web address] After hours, contact General Neurology

## 2016-10-30 NOTE — Progress Notes (Signed)
[facility] PROGRESS NOTE  Amber [name] SKA:[id number] DOB: Jan 23, 1956 DOA: 10/25/2016   PCP: Sandi Mariscal, MD     Assessment/Plan: Principal Problem:   Embolic stroke [facility]) Active Problems:   Microcytic anemia   Asthma   Diabetes mellitus type 2, controlled ([place])   Essential hypertension   Pulmonary hypertension   Seizure ([place])   New onset atrial fibrillation ([place])   Cerebrovascular accident (CVA) due to embolism ([place])   Tachycardia   CVA (cerebral vascular accident) ([place])   Amber [name] is a 60 y.o. female with a past medical history significant for HTN, NIDDM, HFpEF, seizures, chronic pain, and asthma on chronic prednisone who presents with arm shaking today,c/c of left arm tremor, which began at about 3 AM on Wednesday. Dr. Roxanne Mins observed the intermittent jerking movements of her left arm and felt that they most likely represented partial seizure activity. She was loaded with 1000 mg Keppra IV.  CT head showed a possible R frontal lobe infarct and the patient was loaded with Keppra.   Assessment and plan Embolic CVA Followed by neurology, they recommend TEE Hypercoagulable workup  MRI  numerous supra and infratentorial bilateral embolic infarcts, largest at right MCA frontal cortical region  MRA head and neck unremarkable  2D Echo  -LV EF: 60% -   65%  TCD with bubble study -  Likely positive Transcranial Doppler Bubble Study indicative of a moderate right to left intracardiac shunt. We will have TEE on Monday to further confirm.  RUE and BLE venous doppler -Positive for chronic appearing thrombus in a short segment of the cephalic vein in the upper arm. Lower extremity DVT negative  EKG - sinus tach  TEE to look for embolic source. Arranged with [facility] for 12/8.    LDL 33  HgbA1c  4.7  UDS positive for opiates and benzodiazepines  Hypercoagulable work up pending (anti--DNA, ANA, cardiolipin antibody, homocystine, beta  2 glycoprotein, lupus anticoagulant, are all pending- CRP, ESR, HIV, RPR are all negative)  VitB12, TSH  0.85  lovenox for VTE prophylaxis.  EEG unremarkable  Continue aspirin 325 mg   CIR decision pending, will also order social work consultation in case CIR disqualifies her  Impacted fracture of the right shoulder,Right glenoid fx Orthopedics consult requested-Dr. Margarito Liner mx   Tachycardia-appears to be sinus EKG wnl, vq scan negative  Continue low-dose metoprolol prn   Seizure Neurology continues to have concerns for seizure activity -EEG in progress Loaded with keppra 1044m IV Keppra now changed over to vimpat   HTN:  -Permissive hypertension for now, hold metoprolol and furosemide/K -Continue statin   NIDDM:  -Hold metformin,  Can potentially discontinue home meds, hemoglobin A1c 4.7    Chronic pain Patient takes multiple narcotic medications at home  Oxymorphone, currently receiving MS Contin   Asthma: -Continue chronic prednisone   Anxiety-patient takes Valium at home    DVT prophylaxsis Lovenox  Code Status:  Full code   Family Communication: Discussed in detail with the patient, all imaging results, lab results explained to the patient   Disposition Plan:   1-2 days, CIR       Consultants:  Orthopedics  Neurology  Procedures:  None  Antibiotics: Anti-infectives    None         HPI/Subjective Somnolent but arrousable  Objective: Vitals:   10/29/16 1700 10/29/16 2123 10/30/16 0109 10/30/16 0517  BP: (!) 140/92 (!) 149/104 (!) 129/97 (!) 154/100  Pulse: (Marland Kitchen  121 (!) 111 98 (!) 112  Resp: _0 Temp: 97.9 F (36.6 C) 98.2 F (36.8 C) 98.6 F (37 C) 98.7 F (37.1 C)  TempSrc: Oral Oral Oral Oral  SpO2: 99% 99% 100% 99%  Weight:      Height:        Intake/Output Summary (Last 24 hours) at 10/30/16 1000 Last data filed at 10/29/16 2100  Gross per 24 hour  Intake              120 ml  Output                 0 ml  Net              120 ml    Exam:  Examination:  General exam: Appears calm and comfortable  Respiratory system: Clear to auscultation. Respiratory effort normal. Cardiovascular system: S1 & S2 heard, RRR. No JVD, murmurs, rubs, gallops or clicks. No pedal edema. Gastrointestinal system: Abdomen is nondistended, soft and nontender. No organomegaly or masses felt. Normal bowel sounds heard. Central nervous system: Alert and oriented. No focal neurological deficits. Extremities: Symmetric 5 x 5 power. Skin: No rashes, lesions or ulcers Psychiatry: Judgement and insight appear normal. Mood & affect appropriate.     Data Reviewed: I have personally reviewed following labs and imaging studies  Micro Results Recent Results (from the past 240 hour(s))  MRSA PCR Screening     Status: None   Collection Time: 10/26/16  7:35 PM  Result Value Ref Range Status   MRSA by PCR NEGATIVE NEGATIVE Final    Comment:        The GeneXpert MRSA Assay (FDA approved for NASAL specimens only), is one component of a comprehensive MRSA colonization surveillance program. It is not intended to diagnose MRSA infection nor to guide or monitor treatment for MRSA infections.     Radiology Reports Dg Chest 2 View  Result Date: 10/27/2016 CLINICAL DATA:  Stroke 2 days ago EXAM: CHEST  2 VIEW COMPARISON:  08/22/2016 FINDINGS: Cardiomegaly again noted. No infiltrate or pleural effusion. No pulmonary edema. Stable left basilar scarring. Metallic fixation rods thoracic spine again noted. Degenerative changes bilateral shoulders. IMPRESSION: No active cardiopulmonary disease. Cardiomegaly again noted. Stable left basilar scarring. Electronically Signed   By: Lahoma Crocker M.D.   On: 10/27/2016 16:44   Dg Shoulder Right  Result Date: 10/26/2016 CLINICAL DATA:  60 y/o F; status post fall with right shoulder pain. EXAM: RIGHT SHOULDER - 2+ VIEW COMPARISON:  05/19/2016 right shoulder radiographs. FINDINGS:  The humeral head is seated on the glenoid. There is deformity of the glenoid rim most pronounced superiorly probably representing impacted fracture and there are small calcific densities surrounding the proximal humerus which may represent intra-articular bodies. Clavicle is obscured by the mandible. Chronic right upper posterior lateral rib fractures. IMPRESSION: Deformity of glenoid rim most pronounced superiorly may represent an impacted fracture. No dislocation. Ossific densities surrounding proximal humerus may represent intra-articular bodies. Electronically Signed   By: Kristine Garbe M.D.   On: 10/26/2016 15:11   Ct Head Wo Contrast  Result Date: 10/26/2016 CLINICAL DATA:  Fall 2 weeks ago. Fall backward after walker malfunction. Focal seizure. EXAM: CT HEAD WITHOUT CONTRAST TECHNIQUE: Contiguous axial images were obtained from the base of the skull through the vertex without intravenous contrast. COMPARISON:  Head CT 02/28/2016 FINDINGS: Brain: Focal cortical hypodensity in the posterior right frontal lobe concerning for subacute ischemia. No intracranial hemorrhage. Background  chronic small vessel ischemia is similar. No subdural or extra-axial fluid collection. No hydrocephalus. Vascular: Atherosclerosis of skullbase vasculature without hyperdense vessel or abnormal calcification. Skull: No acute fracture. Stable sclerotic focus in the right frontal bone. Sinuses/Orbits: Paranasal sinuses and mastoid air cells are clear. The visualized orbits are unremarkable. Other: None. IMPRESSION: Findings concerning for subacute ischemia in the posterior right frontal lobe. These results were called by telephone at the time of interpretation on 10/26/2016 at 1:34 am to Dr. Delora Fuel , who verbally acknowledged these results. Electronically Signed   By: Jeb Levering M.D.   On: 10/26/2016 01:34   Mr Jodene Nam Head Wo Contrast  Result Date: 10/26/2016 CLINICAL DATA:  Suspected emboli. Multiple acute  strokes. Seizure activity. EXAM: MRA NECK WITHOUT AND WITH CONTRAST MRA HEAD WITHOUT CONTRAST TECHNIQUE: Multiplanar and multiecho pulse sequences of the neck were obtained without and with intravenous contrast. Angiographic images of the neck were obtained using MRA technique without and with intravenous contrast.; Angiographic images of the Circle of Willis were obtained using MRA technique without intravenous contrast. CONTRAST:  64m MULTIHANCE GADOBENATE DIMEGLUMINE 529 MG/ML IV SOLN COMPARISON:  MR brain earlier today. FINDINGS: MRA NECK FINDINGS Marked dolichoectasia that suggesting longstanding hypertension. No visible ascending aortic dissection or proximal great vessel stenosis. Carotid bifurcations free of disease. No cervical ICA dissection or fibromuscular dysplasia. RIGHT vertebral is the sole contributor to the basilar. No flow-limiting stenosis through the neck or proximal intracranial segments. LEFT vertebral is poorly seen at its origin and there may be a significant ostial stenosis. In the neck the LEFT vertebral ascends in a fairly normal caliber, not as large as the RIGHT. Intracranially, it terminates in PICA. MRA HEAD FINDINGS Minor non stenotic atheromatous change in the cavernous and supraclinoid ICA segments bilaterally. Minor irregularity the anterior and middle cerebral arteries without flow-limiting proximal stenosis. No MCA branch occlusion. Basilar artery hypoplastic. This is in part due to BILATERAL fetal PCA anatomy. The RIGHT vertebral is the sole contributor to the basilar. LEFT vertebral terminates in PICA. Both PICA ease, and superior cerebellar arteries are patent. Anterior inferior cerebral artery is poorly visualized. Distal MCA and PCA branches are mildly irregular consistent with intracranial atherosclerotic change. No visible saccular aneurysm. IMPRESSION: No proximal intracranial stenosis or dissection. No extracranial carotid bifurcation, proximal ICA, or visible great  vessel stenosis. Dominant RIGHT vertebral is the sole contributor to the basilar. LEFT vertebral terminates in PICA. Given the relatively unimpressive appearance of the extracranial and intracranial circulation, a cardiac source of emboli is suspected. No large vessel vasculitic change. Mild irregularity of the distal MCA and PCA branches more suggestive of intracranial atherosclerotic change Electronically Signed   By: JStaci RighterM.D.   On: 10/26/2016 16:44   Mr MJodene NamNeck W Wo Contrast  Result Date: 10/26/2016 CLINICAL DATA:  Suspected emboli. Multiple acute strokes. Seizure activity. EXAM: MRA NECK WITHOUT AND WITH CONTRAST MRA HEAD WITHOUT CONTRAST TECHNIQUE: Multiplanar and multiecho pulse sequences of the neck were obtained without and with intravenous contrast. Angiographic images of the neck were obtained using MRA technique without and with intravenous contrast.; Angiographic images of the Circle of Willis were obtained using MRA technique without intravenous contrast. CONTRAST:  198mMULTIHANCE GADOBENATE DIMEGLUMINE 529 MG/ML IV SOLN COMPARISON:  MR brain earlier today. FINDINGS: MRA NECK FINDINGS Marked dolichoectasia that suggesting longstanding hypertension. No visible ascending aortic dissection or proximal great vessel stenosis. Carotid bifurcations free of disease. No cervical ICA dissection or fibromuscular dysplasia. RIGHT vertebral is the  sole contributor to the basilar. No flow-limiting stenosis through the neck or proximal intracranial segments. LEFT vertebral is poorly seen at its origin and there may be a significant ostial stenosis. In the neck the LEFT vertebral ascends in a fairly normal caliber, not as large as the RIGHT. Intracranially, it terminates in PICA. MRA HEAD FINDINGS Minor non stenotic atheromatous change in the cavernous and supraclinoid ICA segments bilaterally. Minor irregularity the anterior and middle cerebral arteries without flow-limiting proximal stenosis. No MCA  branch occlusion. Basilar artery hypoplastic. This is in part due to BILATERAL fetal PCA anatomy. The RIGHT vertebral is the sole contributor to the basilar. LEFT vertebral terminates in PICA. Both PICA ease, and superior cerebellar arteries are patent. Anterior inferior cerebral artery is poorly visualized. Distal MCA and PCA branches are mildly irregular consistent with intracranial atherosclerotic change. No visible saccular aneurysm. IMPRESSION: No proximal intracranial stenosis or dissection. No extracranial carotid bifurcation, proximal ICA, or visible great vessel stenosis. Dominant RIGHT vertebral is the sole contributor to the basilar. LEFT vertebral terminates in PICA. Given the relatively unimpressive appearance of the extracranial and intracranial circulation, a cardiac source of emboli is suspected. No large vessel vasculitic change. Mild irregularity of the distal MCA and PCA branches more suggestive of intracranial atherosclerotic change Electronically Signed   By: Staci Righter M.D.   On: 10/26/2016 16:44   Mr Brain Wo Contrast  Result Date: 10/26/2016 CLINICAL DATA:  Tremor, fell on Thanksgiving with head injury. Headache and dizziness. History of diabetes, seizures, hypertension. EXAM: MRI HEAD WITHOUT CONTRAST TECHNIQUE: Multiplanar, multiecho pulse sequences of the brain and surrounding structures were obtained without intravenous contrast. COMPARISON:  CT HEAD October 26, 2016 at 0101 hours FINDINGS: BRAIN: Confluent up to 4.2 cm reduced diffusion RIGHT posterior frontal parietal lobe, about the central sulcus. Corresponding low ADC values and faint T2 hyperintense signal. Numerous sub cm foci of reduced diffusion bilateral frontal, parietal and bilateral occipital lobes. Symmetric reduced diffusion mesial thalamus suggests artery of [place]. Multiple foci of reduced diffusion measure up to 11 mm in the cerebellum with low ADC values. Ventricles and sulci are normal for patient's age.  Patchy to confluent supratentorial white matter FLAIR T2 hyperintensities exclusive of the aforementioned abnormalities. No midline shift, mass effect or masses. No abnormal extra-axial fluid collections. VASCULAR: Normal major intracranial vascular flow voids present at skull base. SKULL AND UPPER CERVICAL SPINE: No abnormal sellar expansion. No suspicious calvarial bone marrow signal. Craniocervical junction maintained. Grade 1 C3-4 and C4-5 anterolisthesis. SINUSES/ORBITS: The mastoid air-cells and included paranasal sinuses are well-aerated. The included ocular globes and orbital contents are non-suspicious. OTHER: Patient is edentulous. 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 Findings consistent with central embolic phenomena. Mild to moderate chronic small vessel ischemic disease, advanced for age. Electronically Signed   By: Elon Alas M.D.   On: 10/26/2016 05:19   Nm Pulmonary Perf And Vent  Result Date: 10/27/2016 CLINICAL DATA:  Shortness of breath. EXAM: NUCLEAR MEDICINE VENTILATION - PERFUSION LUNG SCAN TECHNIQUE: Ventilation images were obtained in multiple projections using inhaled aerosol Tc-67mDTPA. Perfusion images were obtained in multiple projections after intravenous injection of Tc-943mAA. RADIOPHARMACEUTICALS:  31.6 mCi Technetium-9915mPA aerosol inhalation and 4.38 mCi Technetium-78m28m IV COMPARISON:  Chest x-ray today and chest CT 05/04/2016 FINDINGS: Ventilation: No focal ventilation defect. Perfusion: No wedge shaped peripheral perfusion defects to suggest acute pulmonary embolism. IMPRESSION: Normal VQ scan without  evidence of pulmonary embolism. Electronically Signed   By: Marin Olp M.D.   On: 10/27/2016 16:59     CBC  Recent Labs Lab 10/26/16 0010 10/27/16 0829 10/29/16 0231  WBC 9.8 12.1* 11.8*  HGB 10.4* 9.9* 9.3*  HCT 31.6*  29.7* 28.1*  PLT 203 233 220  MCV 77.8* 76.2* 76.8*  MCH 25.6* 25.4* 25.4*  MCHC 32.9 33.3 33.1  RDW 16.4* 16.2* 15.9*  LYMPHSABS 3.5  --   --   MONOABS 0.7  --   --   EOSABS 0.1  --   --   BASOSABS 0.0  --   --     Chemistries   Recent Labs Lab 10/26/16 0010 10/27/16 0829 10/29/16 0231  NA 142 139 140  K 3.4* 4.1 4.5  CL 112* 110 112*  CO2 21* 20* 18*  GLUCOSE 87 89 95  BUN _0 CREATININE 1.00 0.89 0.94  CALCIUM 8.4* 8.3* 8.7*  AST 41 23 33  ALT _1 ALKPHOS 121 112 112  BILITOT 0.6 0.5 0.2*   ------------------------------------------------------------------------------------------------------------------ estimated creatinine clearance is 46.9 mL/min (by C-G formula based on SCr of 0.94 mg/dL). ------------------------------------------------------------------------------------------------------------------ No results for input(s): HGBA1C in the last 72 hours. ------------------------------------------------------------------------------------------------------------------ No results for input(s): CHOL, HDL, LDLCALC, TRIG, CHOLHDL, LDLDIRECT in the last 72 hours. ------------------------------------------------------------------------------------------------------------------ No results for input(s): TSH, T4TOTAL, T3FREE, THYROIDAB in the last 72 hours.  Invalid input(s): FREET3 ------------------------------------------------------------------------------------------------------------------ No results for input(s): VITAMINB12, FOLATE, FERRITIN, TIBC, IRON, RETICCTPCT in the last 72 hours.  Coagulation profile No results for input(s): INR, PROTIME in the last 168 hours.  No results for input(s): DDIMER in the last 72 hours.  Cardiac Enzymes No results for input(s): CKMB, TROPONINI, MYOGLOBIN in the last 168 hours.  Invalid input(s):  CK ------------------------------------------------------------------------------------------------------------------ Invalid input(s): POCBNP   CBG:  Recent Labs Lab 10/29/16 0600 10/29/16 1152 10/29/16 1653 10/29/16 2131 10/30/16 0643  GLUCAP 98 122* 153* 152* 159*       Studies: No results found.    Lab Results  Component Value Date   HGBA1C 4.7 (L) 10/27/2016   HGBA1C 5.5 02/29/2016   HGBA1C 5.8 (H) 11/07/2015   Lab Results  Component Value Date   LDLCALC 33 10/27/2016   CREATININE 0.94 10/29/2016       Scheduled Meds: .  stroke: mapping our early stages of recovery book   Does not apply Once  . amLODipine  5 mg Oral Daily  . aspirin EC  325 mg Oral Daily  . atorvastatin  10 mg Oral Daily  . diazepam  5 mg Oral BID  . enoxaparin (LOVENOX) injection  40 mg Subcutaneous Q24H  . feeding supplement (ENSURE ENLIVE)  237 mL Oral BID BM  . gabapentin  300 mg Oral BID  . insulin aspart  0-5 Units Subcutaneous QHS  . insulin aspart  0-9 Units Subcutaneous TID WC  . lacosamide  100 mg Oral BID  . morphine  30 mg Oral Q12H  . pantoprazole  40 mg Oral BID AC  . polyethylene glycol  17 g Oral BID  . predniSONE  15 mg Oral Q breakfast   Continuous Infusions:   LOS: 4 days    Time spent: >30 MINS    [facility]  [facility] Pager [phone]. If 7PM-7AM, please contact night-coverage at [email or web address], password [email or web address] 10/30/2016, 10:00 AM  LOS: 4 days

## 2016-10-30 NOTE — Progress Notes (Signed)
Physical Therapy Treatment Patient Details Name: Amber [name] MRN: [id number] DOB: 03/21/1956 Today's Date: 10/30/2016    History of Present Illness This 60 y.o. female admitted with new onset Lt UE tremor which was thought to represent partial seizure.  SHe had a fall Thanksgiving head striking her head with LOC.  CT of head showed subacute ischemic infarct posterior Rt frontal lobe.  ALso with Rt shoulder pain xray revealed Rt glenoid fracture.  PMH includes:  Seizures, PNA, HTN, frequent falls    PT Comments    Patient making slow progress with mobility.  Limited by pain/fatigue.  Patient to d/c to SNF for continued therapy. BP improved this pm - RN reports OK to work with patient.   Follow Up Recommendations  SNF;Supervision/Assistance - 24 hour (PM&R MD recommends SNF)     Equipment Recommendations  3in1 (PT)    Recommendations for Other Services       Precautions / Restrictions Precautions Precautions: Fall Precaution Comments: sling for comfort, Rt UE ROM as tolerated, WBAT Rt UE  Required Braces or Orthoses: Sling Restrictions Weight Bearing Restrictions: Yes RUE Weight Bearing: Weight bearing as tolerated    Mobility  Bed Mobility Overal bed mobility: Needs Assistance Bed Mobility: Supine to Sit     Supine to sit: Mod assist;HOB elevated     General bed mobility comments: Assist to bring trunk to upright position and scoot to EOB in sitting.  Good balance in sitting once upright.  Transfers Overall transfer level: Needs assistance Equipment used: None Transfers: Sit to/from [employer] Sit to Stand: Mod assist Stand pivot transfers: Mod assist       General transfer comment: Verbal cues for Lt hand placement on bed rail and chair armrest.  Patient required mod assist to move to standing and to steady.  Cues to reach across to opposite armrest and pivot to chair.  Patient with difficulty stepping with feet.  Mod assist to move to chair.   Patient able to scoot hips back in chair.  Ambulation/Gait             General Gait Details: NT   Stairs            Wheelchair Mobility    Modified Rankin (Stroke Patients Only) Modified Rankin (Stroke Patients Only) Pre-Morbid Rankin Score: Moderate disability Modified Rankin: Moderately severe disability     Balance Overall balance assessment: Needs assistance Sitting-balance support: No upper extremity supported;Feet supported Sitting balance-Leahy Scale: Fair     Standing balance support: Single extremity supported;During functional activity Standing balance-Leahy Scale: Poor                      Cognition Arousal/Alertness: Awake/alert Behavior During Therapy: WFL for tasks assessed/performed;Anxious Overall Cognitive Status: History of cognitive impairments - at baseline                      Exercises      General Comments General comments (skin integrity, edema, etc.): Patient with improved BP this pm at 144/89      Pertinent Vitals/Pain Pain Assessment: 0-10 Pain Score: 9  Pain Location: Rt shoulder Pain Descriptors / Indicators: Grimacing;Guarding;Sore Pain Intervention(s): Limited activity within patient's tolerance;Monitored during session;Repositioned;Patient requesting pain meds-RN notified;RN gave pain meds during session    Home Living                      Prior Function  PT Goals (current goals can now be found in the care plan section) Acute Rehab PT Goals Patient Stated Goal: to get stronger  Progress towards PT goals: Progressing toward goals    Frequency    Min 3X/week      PT Plan Discharge plan needs to be updated    Co-evaluation             End of Session Equipment Utilized During Treatment: Gait belt Activity Tolerance: Patient limited by fatigue;Patient limited by pain Patient left: in chair;with call bell/phone within reach;with chair alarm set     Time:  1205-1226 PT Time Calculation (min) (ACUTE ONLY): 21 min  Charges:  $Therapeutic Activity: 8-22 mins                    G Codes:      Vena Austria 22-Nov-2016, 4:54 PM Durenda Hurt. Renaldo Fiddler, [facility] Acute Rehab Services Pager [phone]

## 2016-10-30 NOTE — H&P (View-Only) (Signed)
Physical Medicine and Rehabilitation Consult Reason for Consult: Bilateral embolic strokes Referring Phsyician: Abrol Amber [name] Amber [name] is an 60 y.o. female.   HPI: Presented with arm shaking on 10/25/2016. Past medical history significant for hypertension, non-insulin-dependent diabetes mellitus, seizures, chronic pain and steroid dependent asthma. MRI of the brain demonstrated numerous supratentorial and infratentorial acute infarcts in multiple vascular territories, largest in the right frontal parietal lobes. Neurology was consult did and felt this likely represented embolic infarcts from the heart. A large PFO was noted on transthoracic echo and a transesophageal echo is scheduled for 10/30/2016. EEG showed diffuse cerebral  dysfunction. No epileptiform discharges. Patient was complaining of right shoulder pain. Fell on the day of admission. Orthopedic consultation with Dr.Xu , right glenoid fracture with recommendations for arm sling, no operative treatment plan, range of motion and weightbearing as tolerated. PT eval on 10/27/2016. Limitation in therapy due to elevated heart rate of 123 beats per minute. Mod assist. Bed mobility noted  Review of Systems - General ROS: negative Psychological ROS: negative ENT ROS: negative Respiratory ROS: no cough, shortness of breath, or wheezing Cardiovascular ROS: no chest pain or dyspnea on exertion Gastrointestinal ROS: positive for - constipation and gas/bloating Genito-Urinary ROS: no dysuria, trouble voiding, or hematuria Musculoskeletal ROS: positive for - gait disturbance, joint pain, joint stiffness, joint swelling, muscle pain and muscular weakness Neurological ROS: positive for - weakness Dermatological ROS: negative  Past Medical History:  Diagnosis Date  . Arthritis   . Asthma   . Chronic pain   . Constipation   . Diabetes mellitus without complication (HCC)   . Falls frequently   . GERD (gastroesophageal reflux disease)   . Hypertension    . Pneumonia 10/2015  . Seizures (HCC)    last seizure March 2015   Past Surgical History:  Procedure Laterality Date  . APPENDECTOMY    . BACK SURGERY    . CHOLECYSTECTOMY     Family History  Problem Relation Age of Onset  . Kidney failure Mother   . Hypertension Sister   . Hypertension Brother    Social History:  reports that she has quit smoking. She has never used smokeless tobacco. She reports that she does not drink alcohol or use drugs. Allergies:  Allergies  Allergen Reactions  . Ivp Dye [Iodinated Diagnostic Agents] Itching   Medications Prior to Admission  Medication Sig Dispense Refill  . albuterol (PROVENTIL HFA;VENTOLIN HFA) 108 (90 BASE) MCG/ACT inhaler Inhale 2 puffs into the lungs every 6 (six) hours as needed for wheezing or shortness of breath.    Marland Kitchen amLODipine (NORVASC) 5 MG tablet Take 5 mg by mouth daily.    Marland Kitchen aspirin EC 81 MG tablet Take 1 tablet (81 mg total) by mouth daily. 30 tablet 2  . atorvastatin (LIPITOR) 10 MG tablet Take 10 mg by mouth daily.  5  . diazepam (VALIUM) 5 MG tablet Take 1 tablet (5 mg total) by mouth 2 (two) times daily. (Patient taking differently: Take 5 mg by mouth every 12 (twelve) hours as needed for anxiety. ) 14 tablet 0  . feeding supplement, ENSURE ENLIVE, (ENSURE ENLIVE) LIQD Take 237 mLs by mouth 2 (two) times daily between meals. 60 Bottle 0  . Fluticasone-Salmeterol (ADVAIR) 250-50 MCG/DOSE AEPB Inhale 1 puff into the lungs 2 (two) times daily.    . folic acid (FOLVITE) 1 MG tablet Take 1 mg by mouth daily.    . furosemide (LASIX) 40 MG tablet Take 1 tablet (40 mg total) by  mouth daily. 30 tablet 1  . gabapentin (NEURONTIN) 300 MG capsule Take 1 capsule (300 mg total) by mouth 2 (two) times daily. (Patient taking differently: Take 300 mg by mouth at bedtime. ) 60 capsule 0  . HYDROcodone-acetaminophen (NORCO/VICODIN) 5-325 MG tablet Take 1 tablet by mouth every 4 (four) hours as needed. (Patient taking differently: Take 1  tablet by mouth every 4 (four) hours as needed for moderate pain. ) 20 tablet 0  . lactulose (CHRONULAC) 10 GM/15ML solution Take 30 mLs (20 g total) by mouth 2 (two) times daily as needed for severe constipation. 240 mL 0  . LINZESS 145 MCG CAPS capsule Take 1 capsule (145 mcg total) by mouth daily as needed (constipation). 30 capsule 0  . metFORMIN (GLUCOPHAGE-XR) 500 MG 24 hr tablet Take 500 mg by mouth daily with breakfast.    . metoprolol succinate (TOPROL-XL) 25 MG 24 hr tablet Take 25 mg by mouth daily.    . oxymorphone 20 MG T12A Take 20 mg by mouth every 12 (twelve) hours. 10 tablet 0  . pantoprazole (PROTONIX) 40 MG tablet Take 1 tablet (40 mg total) by mouth 2 (two) times daily before a meal. 60 tablet 3  . polyethylene glycol (MIRALAX / GLYCOLAX) packet Take 17 g by mouth 2 (two) times daily. (Patient taking differently: Take 17 g by mouth daily as needed for mild constipation. ) 14 each 0  . potassium chloride SA (K-DUR,KLOR-CON) 20 MEQ tablet Take 2 tablets (40 mEq total) by mouth 2 (two) times daily. (Patient taking differently: Take 20-40 mEq by mouth 2 (two) times daily as needed (cramping). ) 14 tablet 0  . predniSONE (DELTASONE) 10 MG tablet Take 15 mg by mouth daily with breakfast.    . promethazine (PHENERGAN) 25 MG tablet Take 1 tablet by mouth every 6 (six) hours as needed for nausea or vomiting. nausea  0  . Vitamin D, Ergocalciferol, (DRISDOL) 50000 UNITS CAPS capsule Take 1 capsule (50,000 Units total) by mouth every 7 (seven) days. Sunday (Patient taking differently: Take 50,000 Units by mouth every 7 (seven) days. Monday) 30 capsule 0  . VITAMIN E PO Take 1 capsule by mouth daily.      Home: Home Living Family/patient expects to be discharged to:: Private residence Living Arrangements: Alone Available Help at Discharge: Available 24 hours/day, Family Type of Home: House Home Access: Level entry Home Layout: One level Bathroom Shower/Tub: Other (comment) (sponge  bathes) Bathroom Toilet: Standard Home Equipment: Walker - 2 wheels, Wheelchair - manual, Hospital bed Additional Comments: Pt reports her walker is in disrepair and she needs a new one   Lives With: Alone (has home health aids)  Functional History: Prior Function Vocation: On disability Functional Status:  Mobility:          ADL: ADL Toilet Transfer Details (indicate cue type and reason): unable due to elevated BP  Cognition: Cognition Overall Cognitive Status: History of cognitive impairments - at baseline Arousal/Alertness: Awake/alert Orientation Level: Oriented X4 Attention: Selective Selective Attention: Appears intact Memory: Impaired Memory Impairment: Retrieval deficit Awareness: Appears intact Problem Solving: Appears intact Safety/Judgment: Impaired Comments: at baseline per son Cognition Arousal/Alertness: Awake/alert Behavior During Therapy: WFL for tasks assessed/performed Overall Cognitive Status: History of cognitive impairments - at baseline  Blood pressure (!) 141/89, pulse 97, temperature 98 F (36.7 C), temperature source Oral, resp. rate 18, height 5' (1.524 m), weight 48.9 kg (107 lb 12.9 oz), SpO2 98 %. Physical Exam  Nursing note and vitals reviewed. Constitutional:   She appears well-developed and well-nourished. No distress.  HENT:  Head: Normocephalic and atraumatic.  Eyes: Conjunctivae and EOM are normal. Pupils are equal, round, and reactive to light.  Neck: Normal range of motion.  Cardiovascular: Normal rate, regular rhythm, normal heart sounds and intact distal pulses.   No murmur heard. Respiratory: Breath sounds normal. No respiratory distress. She has no wheezes.  GI: Soft. Bowel sounds are normal. She exhibits distension. There is no tenderness.  Musculoskeletal:       Right shoulder: She exhibits decreased range of motion.       Left shoulder: She exhibits decreased range of motion.       Thoracic back: She exhibits decreased  range of motion.       Lumbar back: She exhibits decreased range of motion.  Enlarged MCPs bilaterally with ulnar deviation. Evidence of ulnar deviation at the wrist.  Pain with bilateral knee range of motion. No evidence of knee effusion   Neurological:  Motor strength is 4 minus right finger flexors, proximal not tested secondary to arm pain, left upper extremity 4 minus at the grip, biceps, triceps 3 minus at the deltoid due to range of motion restrictions. 3 minus bilateral hip flexor, knee extensor, 4 minus. Ankle dorsiflexors bilaterally.     Results for orders placed or performed during the hospital encounter of 10/25/16 (from the past 24 hour(s))  Glucose, capillary     Status: Abnormal   Collection Time: 10/28/16 12:18 PM  Result Value Ref Range   Glucose-Capillary 117 (H) 65 - 99 mg/dL  Glucose, capillary     Status: Abnormal   Collection Time: 10/28/16  5:43 PM  Result Value Ref Range   Glucose-Capillary 331 (H) 65 - 99 mg/dL  Glucose, capillary     Status: Abnormal   Collection Time: 10/28/16  9:29 PM  Result Value Ref Range   Glucose-Capillary 147 (H) 65 - 99 mg/dL   Comment 1 Notify RN    Comment 2 Document in Chart   CBC     Status: Abnormal   Collection Time: 10/29/16  2:31 AM  Result Value Ref Range   WBC 11.8 (H) 4.0 - 10.5 K/uL   RBC 3.66 (L) 3.87 - 5.11 MIL/uL   Hemoglobin 9.3 (L) 12.0 - 15.0 g/dL   HCT 95.0 (L) 93.2 - 67.1 %   MCV 76.8 (L) 78.0 - 100.0 fL   MCH 25.4 (L) 26.0 - 34.0 pg   MCHC 33.1 30.0 - 36.0 g/dL   RDW 24.5 (H) 80.9 - 98.3 %   Platelets 220 150 - 400 K/uL  Comprehensive metabolic panel     Status: Abnormal   Collection Time: 10/29/16  2:31 AM  Result Value Ref Range   Sodium 140 135 - 145 mmol/L   Potassium 4.5 3.5 - 5.1 mmol/L   Chloride 112 (H) 101 - 111 mmol/L   CO2 18 (L) 22 - 32 mmol/L   Glucose, Bld 95 65 - 99 mg/dL   BUN 12 6 - 20 mg/dL   Creatinine, Ser 3.82 0.44 - 1.00 mg/dL   Calcium 8.7 (L) 8.9 - 10.3 mg/dL   Total  Protein 5.3 (L) 6.5 - 8.1 g/dL   Albumin 2.3 (L) 3.5 - 5.0 g/dL   AST 33 15 - 41 U/L   ALT 24 14 - 54 U/L   Alkaline Phosphatase 112 38 - 126 U/L   Total Bilirubin 0.2 (L) 0.3 - 1.2 mg/dL   GFR calc non Af Amer >60 >60  mL/min   GFR calc Af Amer >60 >60 mL/min   Anion gap 10 5 - 15  Glucose, capillary     Status: None   Collection Time: 10/29/16  6:00 AM  Result Value Ref Range   Glucose-Capillary 98 65 - 99 mg/dL   Comment 1 Notify RN    Comment 2 Document in Chart    Dg Chest 2 View  Result Date: 10/27/2016 CLINICAL DATA:  Stroke 2 days ago EXAM: CHEST  2 VIEW COMPARISON:  08/22/2016 FINDINGS: Cardiomegaly again noted. No infiltrate or pleural effusion. No pulmonary edema. Stable left basilar scarring. Metallic fixation rods thoracic spine again noted. Degenerative changes bilateral shoulders. IMPRESSION: No active cardiopulmonary disease. Cardiomegaly again noted. Stable left basilar scarring. Electronically Signed   By: Natasha Mead M.D.   On: 10/27/2016 16:44   Nm Pulmonary Perf And Vent  Result Date: 10/27/2016 CLINICAL DATA:  Shortness of breath. EXAM: NUCLEAR MEDICINE VENTILATION - PERFUSION LUNG SCAN TECHNIQUE: Ventilation images were obtained in multiple projections using inhaled aerosol Tc-21m DTPA. Perfusion images were obtained in multiple projections after intravenous injection of Tc-30m MAA. RADIOPHARMACEUTICALS:  31.6 mCi Technetium-36m DTPA aerosol inhalation and 4.38 mCi Technetium-58m MAA IV COMPARISON:  Chest x-ray today and chest CT 05/04/2016 FINDINGS: Ventilation: No focal ventilation defect. Perfusion: No wedge shaped peripheral perfusion defects to suggest acute pulmonary embolism. IMPRESSION: Normal VQ scan without evidence of pulmonary embolism. Electronically Signed   By: Elberta Fortis M.D.   On: 10/27/2016 16:59    Assessment/Plan: Diagnosis: Bilateral supra and infratentorial infarcts. Cardioembolic with PFO 1. Does the need for close, 24 hr/day medical  supervision in concert with the patient's rehab needs make it unreasonable for this patient to be served in a less intensive setting? Potentially 2. Co-Morbidities requiring supervision/potential complications: Severe rheumatoid arthritis, right glenoid fracture 3. Due to bladder management, bowel management, safety, skin/wound care, disease management, medication administration, pain management and patient education, does the patient require 24 hr/day rehab nursing? Potentially 4. Does the patient require coordinated care of a physician, rehab nurse, PT (1 hrs/day, 5 days/week), OT (1 hrs/day, 5 days/week) and SLP (.5 hrs/day, 5 days/week) to address physical and functional deficits in the context of the above medical diagnosis(es)? Potentially Addressing deficits in the following areas: balance, endurance, locomotion, strength, transferring, bowel/bladder control, bathing, dressing, feeding, grooming, toileting, cognition, speech, language, swallowing and psychosocial support 5. Can the patient actively participate in an intensive therapy program of at least 3 hrs of therapy per day at least 5 days per week? No 6. The potential for patient to make measurable gains while on inpatient rehab is poor 7. Anticipated functional outcomes upon discharge from inpatients are NA PT, NA OT, NASLP 8. Estimated rehab length of stay to reach the above functional goals is: NA 9. Does the patient have adequate social supports to accommodate these discharge functional goals? N/A 10. Anticipated D/C setting: Other 11. Anticipated post D/C treatments: HH therapy 12. Overall Rehab/Functional Prognosis: fair  RECOMMENDATIONS: This patient's condition is appropriate for continued rehabilitative care in the following setting: SNF Patient has agreed to participate in recommended program. Potentially Note that insurance prior authorization may be required for reimbursement for recommended care.  Comment: Due to severe  RA, chronic pain, history of spinal fusion, right glenoid fracture, as well as recent CVA do not think the patient is able to tolerate therapy. Physical therapy has been held due to tachycardia greater than 120 bpm.   Claudette Laws E 10/29/2016

## 2016-10-30 NOTE — Progress Notes (Signed)
  Echocardiogram Echocardiogram Transesophageal has been performed.  Arvil Chaco 10/30/2016, 2:52 PM

## 2016-10-30 NOTE — Progress Notes (Signed)
Dr. Wynn Banker recommends SNF rehab at this time. [phone]

## 2016-10-30 NOTE — Progress Notes (Signed)
Occupational Therapy Treatment Patient Details Name: Prentiss Hammett MRN: [id number] DOB: [birth year] Today's Date: 10/30/2016    History of present illness This 60 y.o. female admitted with new onset Lt UE tremor which was thought to represent partial seizure.  SHe had a fall Thanksgiving head striking her head with LOC.  CT of head showed subacute ischemic infarct posterior Rt frontal lobe.  ALso with Rt shoulder pain xray revealed Rt glenoid fracture.  PMH includes:  Seizures, PNA, HTN, frequent falls   OT comments  Pt requesting to void on arrival and (A) to chair . Pt with ice applied to R shoulder and reports back feels better now in recliner. Pt with RN present and requesting medication.    Follow Up Recommendations  CIR    Equipment Recommendations  3 in 1 bedside commode    Recommendations for Other Services      Precautions / Restrictions Precautions Precautions: Fall Precaution Comments: sling for comfort, Rt UE ROM as tolerated, WBAT Rt UE  Required Braces or Orthoses: Sling Restrictions Weight Bearing Restrictions: Yes RUE Weight Bearing: Weight bearing as tolerated       Mobility Bed Mobility Overal bed mobility: Needs Assistance Bed Mobility: Supine to Sit;Rolling Rolling: Mod assist   Supine to sit: Mod assist     General bed mobility comments: Pt needed (A) to progress bil LE to EOB. pad used to scoot patient to EOB  Transfers Overall transfer level: Needs assistance Equipment used: 1 person hand held assist;Rolling walker (2 wheeled) Transfers: Sit to/from [employer] Sit to Stand: Mod assist Stand pivot transfers: Mod assist       General transfer comment: Pt requires RW to pivot to chair but did transfer hand held (A) to 3n1    Balance Overall balance assessment: Needs assistance Sitting-balance support: Single extremity supported;Feet supported Sitting balance-Leahy Scale: Fair Sitting balance - Comments: static sitting                             ADL Overall ADL's : Needs assistance/impaired                         Toilet Transfer: Moderate assistance;BSC;Stand-pivot Toilet Transfer Details (indicate cue type and reason): Pt completed EOB to toilet transfer R side with one person (A) . pt voiding bowel and bladder. pt provided RW for L UE support for peri care. pt able to hold with BIL UE. pt moving R UE out of sling for task. Toileting- Clothing Manipulation and Hygiene: Total assistance Toileting - Clothing Manipulation Details (indicate cue type and reason): Pt supine with bed pain on arrival with tech (A). pt with minimal void of bowels but reports finished. OT (A) tech rolling L side for peri care. pt once sitting requesting void again and provided 3n1 with incr void of large volume     Functional mobility during ADLs: Minimal assistance;Rolling walker General ADL Comments: pt complete transfer eOB to 3n1 and 3n1 to chair. pt moving R UE out of sling and using it on RW. pt with L UE sliding off RW and needs attention to hold with L UE      Vision                 Additional Comments: no deficits noted with functional task at this time   Perception     Praxis      Cognition   Behavior  During Therapy: WFL for tasks assessed/performed Overall Cognitive Status: History of cognitive impairments - at baseline                       Extremity/Trunk Assessment               Exercises     Shoulder Instructions       General Comments      Pertinent Vitals/ Pain       Pain Assessment: Faces Faces Pain Scale: Hurts even more Pain Location: generalized Pain Descriptors / Indicators: Grimacing Pain Intervention(s): Limited activity within patient's tolerance;Monitored during session;Premedicated before session;Repositioned;Ice applied (to R shoulder)  Home Living                                          Prior Functioning/Environment               Frequency  Min 2X/week        Progress Toward Goals  OT Goals(current goals can now be found in the care plan section)  Progress towards OT goals: Progressing toward goals  Acute Rehab OT Goals Patient Stated Goal: to get stronger  OT Goal Formulation: With patient Time For Goal Achievement: 11/10/16 Potential to Achieve Goals: Good ADL Goals Pt Will Perform Grooming: with set-up;sitting Pt Will Perform Upper Body Bathing: with min assist;sitting Pt Will Perform Lower Body Bathing: with min assist;sit to/from stand Pt Will Perform Upper Body Dressing: with supervision;sitting Pt Will Perform Lower Body Dressing: with min assist;sit to/from stand;with adaptive equipment Pt Will Transfer to Toilet: with min assist;ambulating;regular height toilet;bedside commode;grab bars Pt Will Perform Toileting - Clothing Manipulation and hygiene: with min assist;sit to/from stand  Plan Discharge plan remains appropriate    Co-evaluation                 End of Session Equipment Utilized During Treatment: Gait belt   Activity Tolerance Patient tolerated treatment well   Patient Left in chair;with call bell/phone within reach;with chair alarm set;with nursing/sitter in room   Nurse Communication Mobility status;Precautions        Time: 8182-9937 OT Time Calculation (min): 27 min  Charges: OT General Charges $OT Visit: 1 Procedure OT Treatments $Self Care/Home Management : 23-37 mins  Boone Master B 10/30/2016, 9:12 AM  Mateo Flow   OTR/L Pager: [phone] Office: [phone] .

## 2016-10-31 DIAGNOSIS — Q211 Atrial septal defect: Secondary | ICD-10-CM

## 2016-10-31 DIAGNOSIS — I63119 Cerebral infarction due to embolism of unspecified vertebral artery: Secondary | ICD-10-CM

## 2016-10-31 LAB — CBC
HCT: 27.4 % — ABNORMAL LOW (ref 36.0–46.0)
Hemoglobin: 8.9 g/dL — ABNORMAL LOW (ref 12.0–15.0)
MCH: 25.4 pg — ABNORMAL LOW (ref 26.0–34.0)
MCHC: 32.5 g/dL (ref 30.0–36.0)
MCV: 78.1 fL (ref 78.0–100.0)
Platelets: 252 10*3/uL (ref 150–400)
RBC: 3.51 MIL/uL — ABNORMAL LOW (ref 3.87–5.11)
RDW: 16.3 % — ABNORMAL HIGH (ref 11.5–15.5)
WBC: 12.9 10*3/uL — ABNORMAL HIGH (ref 4.0–10.5)

## 2016-10-31 LAB — GLUCOSE, CAPILLARY
GLUCOSE-CAPILLARY: 96 mg/dL (ref 65–99)
Glucose-Capillary: 141 mg/dL — ABNORMAL HIGH (ref 65–99)
Glucose-Capillary: 188 mg/dL — ABNORMAL HIGH (ref 65–99)

## 2016-10-31 LAB — CARDIOLIPIN ANTIBODIES, IGG, IGM, IGA
ANTICARDIOLIPIN IGM: 11 [MPL'U]/mL (ref 0–12)
Anticardiolipin IgA: <9 APL U/mL (ref 0–11)
Anticardiolipin IgG: <9 GPL U/mL (ref 0–14)

## 2016-10-31 MED ORDER — LACOSAMIDE 100 MG PO TABS: 100.0000 mg | ORAL_TABLET | Freq: Two times a day (BID) | ORAL | 2 refills | Status: DC

## 2016-10-31 MED ORDER — FUROSEMIDE 20 MG PO TABS: 20.0000 mg | ORAL_TABLET | Freq: Every day | ORAL | Status: DC

## 2016-10-31 MED ORDER — OXYMORPHONE HCL ER 20 MG PO T12A: 20.0000 mg | EXTENDED_RELEASE_TABLET | Freq: Two times a day (BID) | ORAL | 0 refills | Status: DC

## 2016-10-31 MED ORDER — DIAZEPAM 5 MG PO TABS: 2.5000 mg | ORAL_TABLET | Freq: Two times a day (BID) | ORAL | 0 refills | Status: DC

## 2016-10-31 MED ORDER — ASPIRIN 325 MG PO TBEC: 325.0000 mg | DELAYED_RELEASE_TABLET | Freq: Every day | ORAL | 0 refills | Status: DC

## 2016-10-31 MED ORDER — ENOXAPARIN SODIUM 40 MG/0.4ML ~~LOC~~ SOLN: 40.0000 mg | SUBCUTANEOUS | 0 refills | Status: DC

## 2016-10-31 MED ORDER — METOPROLOL TARTRATE 25 MG PO TABS
25.0000 mg | ORAL_TABLET | Freq: Once | ORAL | Status: AC
  Administered 2016-10-31: 25 mg via ORAL
  Filled 2016-10-31: qty 1

## 2016-10-31 NOTE — Consult Note (Signed)
CARDIOLOGY CONSULT NOTE  Patient ID: Amber [name], MRN: [id number], DOB/AGE: August 18, 1956 60 y.o. Admit date: 10/25/2016 Date of Consult: 10/31/2016  Primary Physician: [name], MD Primary Cardiologist: none Referring Physician: Dr Pearlean Brownie  Chief Complaint: Syncope/weakness  Reason for Consultation: stroke/PFO  HPI: 60 year old woman with history of hypertension, type 2 diabetes, chronic diastolic heart failure, chronic pain, and asthma. She presented with weakness, tremor, and near syncope on 10/25/2016.  Based on MRI findings the patient was diagnosed with an acute stroke. 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 She has recovered reasonably well and plans to be discharged later today to skilled nursing for recovery. Her children are at the bedside at time of my interview. She has no past history of stroke. She does have a questionable history of seizures. She complains of mild shortness of breath with exertion. No resting shortness of breath, chest pain, chest pressure, or leg swelling. The patient has been able to live independently. She has some limitation in her mobility. She ambulates with a cane or walker. She is able to drive but has not been driving recently.  Medical History:  Past Medical History:  Diagnosis Date  . Arthritis   . Asthma   . Chronic pain   . Constipation   . Diabetes mellitus without complication (HCC)   . Embolic stroke (HCC)   . Falls frequently   . GERD (gastroesophageal reflux disease)   . Hypertension   . Pneumonia 10/2015  . Seizures (HCC)    last seizure March 2015      Surgical History:  Past Surgical History:  Procedure Laterality Date  . APPENDECTOMY    . BACK SURGERY    . CHOLECYSTECTOMY    . TEE WITHOUT CARDIOVERSION N/A 10/30/2016   Procedure: TRANSESOPHAGEAL ECHOCARDIOGRAM (TEE);  Surgeon: Lars Masson, MD;  Location: [facility] ENDOSCOPY;  Service: Cardiovascular;  Laterality: N/A;       Home Meds: Prior to Admission medications   Medication Sig Start Date End Date Taking? Authorizing Provider  albuterol (PROVENTIL HFA;VENTOLIN HFA) 108 (90 BASE) MCG/ACT inhaler Inhale 2 puffs into the lungs every 6 (six) hours as needed for wheezing or shortness of breath.   Yes Historical Provider, MD  amLODipine (NORVASC) 5 MG tablet Take 5 mg by mouth daily.   Yes Historical Provider, MD  aspirin EC 81 MG tablet Take 1 tablet (81 mg total) by mouth daily. 06/14/15  Yes Richarda Overlie, MD  atorvastatin (LIPITOR) 10 MG tablet Take 10 mg by mouth daily. 08/13/15  Yes Historical Provider, MD  feeding supplement, ENSURE ENLIVE, (ENSURE ENLIVE) LIQD Take 237 mLs by mouth 2 (two) times daily between meals. 11/10/15  Yes Catarina Hartshorn, MD  Fluticasone-Salmeterol (ADVAIR) 250-50 MCG/DOSE AEPB Inhale 1 puff into the lungs 2 (two) times daily.   Yes Historical Provider, MD  folic acid (FOLVITE) 1 MG tablet Take 1 mg by mouth daily.   Yes Historical Provider, MD  gabapentin (NEURONTIN) 300 MG capsule Take 1 capsule (300 mg total) by mouth 2 (two) times daily. Patient taking differently: Take 300 mg by mouth at bedtime.  06/14/15  Yes Richarda Overlie, MD  HYDROcodone-acetaminophen (NORCO/VICODIN) 5-325 MG tablet Take 1 tablet by mouth every 4 (four) hours as needed. Patient taking differently: Take 1 tablet by mouth every 4 (four) hours as needed for moderate pain.  07/01/16  Yes Rolland Porter, MD  lactulose (CHRONULAC) 10 GM/15ML solution Take 30 mLs (20 g total) by mouth 2 (two)  times daily as needed for severe constipation. 08/22/16  Yes Jacalyn LefevreJulie Haviland, MD  LINZESS 145 MCG CAPS capsule Take 1 capsule (145 mcg total) by mouth daily as needed (constipation). 05/26/16  Yes Penny Piarlando Vega, MD  metFORMIN (GLUCOPHAGE-XR) 500 MG 24 hr tablet Take 500 mg by mouth daily with breakfast.   Yes Historical Provider, MD  metoprolol succinate (TOPROL-XL) 25 MG 24 hr tablet Take 25 mg by mouth daily.   Yes Historical Provider, MD   pantoprazole (PROTONIX) 40 MG tablet Take 1 tablet (40 mg total) by mouth 2 (two) times daily before a meal. 12/25/15  Yes Ripudeep K Rai, MD  polyethylene glycol (MIRALAX / GLYCOLAX) packet Take 17 g by mouth 2 (two) times daily. Patient taking differently: Take 17 g by mouth daily as needed for mild constipation.  05/26/16  Yes Penny Piarlando Vega, MD  potassium chloride SA (K-DUR,KLOR-CON) 20 MEQ tablet Take 2 tablets (40 mEq total) by mouth 2 (two) times daily. Patient taking differently: Take 20-40 mEq by mouth 2 (two) times daily as needed (cramping).  08/22/16  Yes Jacalyn LefevreJulie Haviland, MD  predniSONE (DELTASONE) 10 MG tablet Take 15 mg by mouth daily with breakfast.   Yes Historical Provider, MD  promethazine (PHENERGAN) 25 MG tablet Take 1 tablet by mouth every 6 (six) hours as needed for nausea or vomiting. nausea 10/06/15  Yes Historical Provider, MD  Vitamin D, Ergocalciferol, (DRISDOL) 50000 UNITS CAPS capsule Take 1 capsule (50,000 Units total) by mouth every 7 (seven) days. Sunday Patient taking differently: Take 50,000 Units by mouth every 7 (seven) days. Monday 01/27/15  Yes Calvert CantorSaima Rizwan, MD  VITAMIN E PO Take 1 capsule by mouth daily.   Yes Historical Provider, MD  aspirin EC 325 MG EC tablet Take 1 tablet (325 mg total) by mouth daily. 10/31/16   Richarda OverlieNayana Abrol, MD  diazepam (VALIUM) 5 MG tablet Take 0.5 tablets (2.5 mg total) by mouth 2 (two) times daily. 10/31/16   Richarda OverlieNayana Abrol, MD  enoxaparin (LOVENOX) 40 MG/0.4ML injection Inject 0.4 mLs (40 mg total) into the skin daily. 10/31/16 11/10/16  Richarda OverlieNayana Abrol, MD  furosemide (LASIX) 20 MG tablet Take 1 tablet (20 mg total) by mouth daily. 10/31/16   Richarda OverlieNayana Abrol, MD  lacosamide 100 MG TABS Take 1 tablet (100 mg total) by mouth 2 (two) times daily. 10/31/16   Richarda OverlieNayana Abrol, MD  Oxymorphone HCl, Crush Resist, 20 MG PO T12A Take 20 mg by mouth every 12 (twelve) hours. 10/31/16   Richarda OverlieNayana Abrol, MD    Inpatient Medications:  .  stroke: mapping our early  stages of recovery book   Does not apply Once  . amLODipine  5 mg Oral Daily  . aspirin EC  325 mg Oral Daily  . atorvastatin  10 mg Oral Daily  . diazepam  5 mg Oral BID  . enoxaparin (LOVENOX) injection  40 mg Subcutaneous Q24H  . feeding supplement (ENSURE ENLIVE)  237 mL Oral BID BM  . gabapentin  300 mg Oral BID  . insulin aspart  0-5 Units Subcutaneous QHS  . insulin aspart  0-9 Units Subcutaneous TID WC  . lacosamide  100 mg Oral BID  . morphine  30 mg Oral Q12H  . pantoprazole  40 mg Oral BID AC  . polyethylene glycol  17 g Oral BID  . predniSONE  15 mg Oral Q breakfast     Allergies:  Allergies  Allergen Reactions  . Ivp Dye [Iodinated Diagnostic Agents] Itching    Social History  Social History  . Marital status: Single    Spouse name: N/A  . Number of children: N/A  . Years of education: N/A   Occupational History  . Not on file.   Social History Main Topics  . Smoking status: Former Smoker    Quit date: 2010  . Smokeless tobacco: Never Used  . Alcohol use No     Comment: admits to 2 drinks/week  . Drug use: No  . Sexual activity: No   Other Topics Concern  . Not on file   Social History Narrative  . No narrative on file     Family History  Problem Relation Age of Onset  . Kidney failure Mother   . Hypertension Sister   . Hypertension Brother      Review of Systems: General: negative for chills, fever, night sweats or weight changes.  ENT: negative for rhinorrhea or epistaxis Cardiovascular: no chest pain, positive for shortness of breath Dermatological: negative for rash Respiratory: negative for cough or wheezing GI: negative for nausea, vomiting, diarrhea, bright red blood per rectum, melena, or hematemesis GU: no hematuria, urgency, or frequency Neurologic: syncope, seizures Heme: no easy bruising or bleeding Endo: negative for excessive thirst, thyroid disorder, or flushing Musculoskeletal: positive for joint pains  All other  systems reviewed and are otherwise negative except as noted above.  Physical Exam: Blood pressure 140/88, pulse 96, temperature 98.4 F (36.9 C), temperature source Oral, resp. rate 20, height 5' (1.524 m), weight 107 lb (48.5 kg), SpO2 99 %. Pt is alert and oriented, in no distress. Pleasant woman, appears older than her stated age.  HEENT: normal Neck: JVP normal. Carotid upstrokes normal without bruits. No thyromegaly. Lungs: equal expansion, clear bilaterally CV: Apex is discrete and nondisplaced, RRR without murmur or gallop Abd: soft, NT, +BS, no bruit, no hepatosplenomegaly Back: no CVA tenderness Ext: no C/C/E        DP/PT pulses intact and = Skin: warm and dry without rash     Labs: No results for input(s): CKTOTAL, CKMB, TROPONINI in the last 72 hours. Lab Results  Component Value Date   WBC 12.9 (H) 10/31/2016   HGB 8.9 (L) 10/31/2016   HCT 27.4 (L) 10/31/2016   MCV 78.1 10/31/2016   PLT 252 10/31/2016    Recent Labs Lab 10/29/16 0231  NA 140  K 4.5  CL 112*  CO2 18*  BUN 12  CREATININE 0.94  CALCIUM 8.7*  PROT 5.3*  BILITOT 0.2*  ALKPHOS 112  ALT 24  AST 33  GLUCOSE 95   Lab Results  Component Value Date   CHOL 105 10/27/2016   HDL 61 10/27/2016   LDLCALC 33 10/27/2016   TRIG 55 10/27/2016   Lab Results  Component Value Date   DDIMER 2.29 (H) 12/24/2015    Radiology/Studies:  Dg Chest 2 View  Result Date: 10/27/2016 CLINICAL DATA:  Stroke 2 days ago EXAM: CHEST  2 VIEW COMPARISON:  08/22/2016 FINDINGS: Cardiomegaly again noted. No infiltrate or pleural effusion. No pulmonary edema. Stable left basilar scarring. Metallic fixation rods thoracic spine again noted. Degenerative changes bilateral shoulders. IMPRESSION: No active cardiopulmonary disease. Cardiomegaly again noted. Stable left basilar scarring. Electronically Signed   By: Natasha Mead M.D.   On: 10/27/2016 16:44   Dg Shoulder Right  Result Date: 10/26/2016 CLINICAL DATA:  60 y/o F;  status post fall with right shoulder pain. EXAM: RIGHT SHOULDER - 2+ VIEW COMPARISON:  05/19/2016 right shoulder radiographs. FINDINGS: The humeral head  is seated on the glenoid. There is deformity of the glenoid rim most pronounced superiorly probably representing impacted fracture and there are small calcific densities surrounding the proximal humerus which may represent intra-articular bodies. Clavicle is obscured by the mandible. Chronic right upper posterior lateral rib fractures. IMPRESSION: Deformity of glenoid rim most pronounced superiorly may represent an impacted fracture. No dislocation. Ossific densities surrounding proximal humerus may represent intra-articular bodies. Electronically Signed   By: Mitzi Hansen M.D.   On: 10/26/2016 15:11   Ct Head Wo Contrast  Result Date: 10/26/2016 CLINICAL DATA:  Fall 2 weeks ago. Fall backward after walker malfunction. Focal seizure. EXAM: CT HEAD WITHOUT CONTRAST TECHNIQUE: Contiguous axial images were obtained from the base of the skull through the vertex without intravenous contrast. COMPARISON:  Head CT 02/28/2016 FINDINGS: Brain: Focal cortical hypodensity in the posterior right frontal lobe concerning for subacute ischemia. No intracranial hemorrhage. Background chronic small vessel ischemia is similar. No subdural or extra-axial fluid collection. No hydrocephalus. Vascular: Atherosclerosis of skullbase vasculature without hyperdense vessel or abnormal calcification. Skull: No acute fracture. Stable sclerotic focus in the right frontal bone. Sinuses/Orbits: Paranasal sinuses and mastoid air cells are clear. The visualized orbits are unremarkable. Other: None. IMPRESSION: Findings concerning for subacute ischemia in the posterior right frontal lobe. These results were called by telephone at the time of interpretation on 10/26/2016 at 1:34 am to Dr. Dione Booze , who verbally acknowledged these results. Electronically Signed   By: Rubye Oaks  M.D.   On: 10/26/2016 01:34   Mr Maxine Glenn Head Wo Contrast  Result Date: 10/26/2016 CLINICAL DATA:  Suspected emboli. Multiple acute strokes. Seizure activity. EXAM: MRA NECK WITHOUT AND WITH CONTRAST MRA HEAD WITHOUT CONTRAST TECHNIQUE: Multiplanar and multiecho pulse sequences of the neck were obtained without and with intravenous contrast. Angiographic images of the neck were obtained using MRA technique without and with intravenous contrast.; Angiographic images of the Circle of Willis were obtained using MRA technique without intravenous contrast. CONTRAST:  20mL MULTIHANCE GADOBENATE DIMEGLUMINE 529 MG/ML IV SOLN COMPARISON:  MR brain earlier today. FINDINGS: MRA NECK FINDINGS Marked dolichoectasia that suggesting longstanding hypertension. No visible ascending aortic dissection or proximal great vessel stenosis. Carotid bifurcations free of disease. No cervical ICA dissection or fibromuscular dysplasia. RIGHT vertebral is the sole contributor to the basilar. No flow-limiting stenosis through the neck or proximal intracranial segments. LEFT vertebral is poorly seen at its origin and there may be a significant ostial stenosis. In the neck the LEFT vertebral ascends in a fairly normal caliber, not as large as the RIGHT. Intracranially, it terminates in PICA. MRA HEAD FINDINGS Minor non stenotic atheromatous change in the cavernous and supraclinoid ICA segments bilaterally. Minor irregularity the anterior and middle cerebral arteries without flow-limiting proximal stenosis. No MCA branch occlusion. Basilar artery hypoplastic. This is in part due to BILATERAL fetal PCA anatomy. The RIGHT vertebral is the sole contributor to the basilar. LEFT vertebral terminates in PICA. Both PICA ease, and superior cerebellar arteries are patent. Anterior inferior cerebral artery is poorly visualized. Distal MCA and PCA branches are mildly irregular consistent with intracranial atherosclerotic change. No visible saccular aneurysm.  IMPRESSION: No proximal intracranial stenosis or dissection. No extracranial carotid bifurcation, proximal ICA, or visible great vessel stenosis. Dominant RIGHT vertebral is the sole contributor to the basilar. LEFT vertebral terminates in PICA. Given the relatively unimpressive appearance of the extracranial and intracranial circulation, a cardiac source of emboli is suspected. No large vessel vasculitic change. Mild irregularity of the  distal MCA and PCA branches more suggestive of intracranial atherosclerotic change Electronically Signed   By: Elsie Stain M.D.   On: 10/26/2016 16:44   Mr Maxine Glenn Neck W Wo Contrast  Result Date: 10/26/2016 CLINICAL DATA:  Suspected emboli. Multiple acute strokes. Seizure activity. EXAM: MRA NECK WITHOUT AND WITH CONTRAST MRA HEAD WITHOUT CONTRAST TECHNIQUE: Multiplanar and multiecho pulse sequences of the neck were obtained without and with intravenous contrast. Angiographic images of the neck were obtained using MRA technique without and with intravenous contrast.; Angiographic images of the Circle of Willis were obtained using MRA technique without intravenous contrast. CONTRAST:  13mL MULTIHANCE GADOBENATE DIMEGLUMINE 529 MG/ML IV SOLN COMPARISON:  MR brain earlier today. FINDINGS: MRA NECK FINDINGS Marked dolichoectasia that suggesting longstanding hypertension. No visible ascending aortic dissection or proximal great vessel stenosis. Carotid bifurcations free of disease. No cervical ICA dissection or fibromuscular dysplasia. RIGHT vertebral is the sole contributor to the basilar. No flow-limiting stenosis through the neck or proximal intracranial segments. LEFT vertebral is poorly seen at its origin and there may be a significant ostial stenosis. In the neck the LEFT vertebral ascends in a fairly normal caliber, not as large as the RIGHT. Intracranially, it terminates in PICA. MRA HEAD FINDINGS Minor non stenotic atheromatous change in the cavernous and supraclinoid ICA  segments bilaterally. Minor irregularity the anterior and middle cerebral arteries without flow-limiting proximal stenosis. No MCA branch occlusion. Basilar artery hypoplastic. This is in part due to BILATERAL fetal PCA anatomy. The RIGHT vertebral is the sole contributor to the basilar. LEFT vertebral terminates in PICA. Both PICA ease, and superior cerebellar arteries are patent. Anterior inferior cerebral artery is poorly visualized. Distal MCA and PCA branches are mildly irregular consistent with intracranial atherosclerotic change. No visible saccular aneurysm. IMPRESSION: No proximal intracranial stenosis or dissection. No extracranial carotid bifurcation, proximal ICA, or visible great vessel stenosis. Dominant RIGHT vertebral is the sole contributor to the basilar. LEFT vertebral terminates in PICA. Given the relatively unimpressive appearance of the extracranial and intracranial circulation, a cardiac source of emboli is suspected. No large vessel vasculitic change. Mild irregularity of the distal MCA and PCA branches more suggestive of intracranial atherosclerotic change Electronically Signed   By: Elsie Stain M.D.   On: 10/26/2016 16:44   Mr Brain Wo Contrast  Result Date: 10/26/2016 CLINICAL DATA:  Tremor, fell on Thanksgiving with head injury. Headache and dizziness. History of diabetes, seizures, hypertension. EXAM: MRI HEAD WITHOUT CONTRAST TECHNIQUE: Multiplanar, multiecho pulse sequences of the brain and surrounding structures were obtained without intravenous contrast. COMPARISON:  CT HEAD October 26, 2016 at 0101 hours FINDINGS: BRAIN: Confluent up to 4.2 cm reduced diffusion RIGHT posterior frontal parietal lobe, about the central sulcus. Corresponding low ADC values and faint T2 hyperintense signal. Numerous sub cm foci of reduced diffusion bilateral frontal, parietal and bilateral occipital lobes. Symmetric reduced diffusion mesial thalamus suggests artery of [place]. Multiple foci of  reduced diffusion measure up to 11 mm in the cerebellum with low ADC values. Ventricles and sulci are normal for patient's age. Patchy to confluent supratentorial white matter FLAIR T2 hyperintensities exclusive of the aforementioned abnormalities. No midline shift, mass effect or masses. No abnormal extra-axial fluid collections. VASCULAR: Normal major intracranial vascular flow voids present at skull base. SKULL AND UPPER CERVICAL SPINE: No abnormal sellar expansion. No suspicious calvarial bone marrow signal. Craniocervical junction maintained. Grade 1 C3-4 and C4-5 anterolisthesis. SINUSES/ORBITS: The mastoid air-cells and included paranasal sinuses are well-aerated. The included ocular globes and  orbital contents are non-suspicious. OTHER: Patient is edentulous. 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 Findings consistent with central embolic phenomena. Mild to moderate chronic small vessel ischemic disease, advanced for age. Electronically Signed   By: Awilda Metro M.D.   On: 10/26/2016 05:19   Nm Pulmonary Perf And Vent  Result Date: 10/27/2016 CLINICAL DATA:  Shortness of breath. EXAM: NUCLEAR MEDICINE VENTILATION - PERFUSION LUNG SCAN TECHNIQUE: Ventilation images were obtained in multiple projections using inhaled aerosol Tc-31m DTPA. Perfusion images were obtained in multiple projections after intravenous injection of Tc-25m MAA. RADIOPHARMACEUTICALS:  31.6 mCi Technetium-69m DTPA aerosol inhalation and 4.38 mCi Technetium-36m MAA IV COMPARISON:  Chest x-ray today and chest CT 05/04/2016 FINDINGS: Ventilation: No focal ventilation defect. Perfusion: No wedge shaped peripheral perfusion defects to suggest acute pulmonary embolism. IMPRESSION: Normal VQ scan without evidence of pulmonary embolism. Electronically Signed   By: Elberta Fortis M.D.   On: 10/27/2016 16:59     EKG: Normal sinus rhythm 99 bpm, T-wave abnormality consider lateral ischemia  Cardiac Studies: 2-D echocardiogram 10/27/2016: Study Conclusions  - Left ventricle: The cavity size was normal. There was mild   concentric hypertrophy. Systolic function was normal. The   estimated ejection fraction was in the range of 60% to 65%. Wall   motion was normal; there were no regional wall motion   abnormalities. There was an increased relative contribution of   atrial contraction to ventricular filling. Doppler parameters are   consistent with abnormal left ventricular relaxation (grade 1   diastolic dysfunction). - Mitral valve: Calcified annulus. Moderate focal calcification of   the anterior leaflet (medial segment(s)). - Pulmonary arteries: Systolic pressure could not be accurately   estimated.  Transcranial Doppler study 10/27/2016: Summary: Study was technically difficult due to poor patient cooperation.  Signals were heard shortly after bubble injection, suspicious for High Intensity Transient Signals/ medium PFO, versus patient interference. Suboptimal TCD bubble study but suspicious for possible right to left intracardiac shunt. Recommend confirmation with TEE if clinically necessary     TEE Study Conclusions  - Left ventricle: Systolic function was normal. Wall motion was   normal; there were no regional wall motion abnormalities. - Aortic valve: Structurally normal valve. Trileaflet; normal   thickness leaflets. There was no significant regurgitation. - Mitral valve: Structurally normal valve. There was trivial   regurgitation. - Left atrium: No evidence of thrombus in the atrial cavity or   appendage. - Right ventricle: The cavity size was normal. Wall thickness was   normal. Systolic function was normal. - Right atrium: No evidence of thrombus in the atrial cavity or   appendage. - Atrial septum: There was a patent foramen ovale. - Tricuspid valve: There was  mild regurgitation.  Impressions:  - Positive bubble study. Present PFO.  ASSESSMENT AND PLAN:  60 year old woman with multiple bilateral cerebral infarcts likely secondary to cardiac embolism. The patient has undergone transcranial Doppler and TEE studies confirming a PFO. I personally reviewed the TEE images and there is a large right to left shunt at the level of the atrial septum with Valsalva. 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 Have discussed her case with Dr. Pearlean Brownie who recommends consideration of a PFO closure in this clinical scenario. The patient does not have atherosclerotic findings on her noninvasive imaging tests. I have discussed treatment options at length with the patient, her son, and her daughter who are  at the bedside. We discussed clinical trial data comparing transcatheter PFO closure to medical therapy. I reviewed the risks, indications, and alternatives to transcatheter closure with the patient. Specific risks include bleeding, infection, device embolization, stroke, cardiac perforation, tamponade, arrhythmia, MI, and late device erosion. She understands these serious risks occur at low incidence of < 1%. I will see the patient back in the office to schedule her for PFO closure after she completes rehabilitation in a skilled nursing facility. She is currently working on placement. Her medical program is reviewed as directed by neurology and the [facility] hospitalist service. She is appropriately on a statin drug and antiplatelet therapy with aspirin. Will start Plavix prior to PFO closure.  SignedTonny Bollman MD, [facility] 10/31/2016, 1:06 PM

## 2016-10-31 NOTE — Progress Notes (Signed)
Physical Therapy Treatment Patient Details Name: Amber [name] MRN: [id number] DOB: December 11, 1955 Today's Date: 10/31/2016    History of Present Illness This 60 y.o. female admitted with new onset Lt UE tremor which was thought to represent partial seizure.  SHe had a fall Thanksgiving head striking her head with LOC.  CT of head showed subacute ischemic infarct posterior Rt frontal lobe.  ALso with Rt shoulder pain xray revealed Rt glenoid fracture.  PMH includes:  Seizures, PNA, HTN, frequent falls    PT Comments    Patient eager to work with therapy despite Rt shoulder pain. Limited session due to arrival of RN and NT to bath and dress patient to prepare for transport to SNF. See below for details re: exercises and education.   Follow Up Recommendations  SNF;Supervision/Assistance - 24 hour (PM&R MD recommends SNF)     Equipment Recommendations  3in1 (PT)    Recommendations for Other Services       Precautions / Restrictions Precautions Precautions: Fall Precaution Comments: sling for comfort, Rt UE ROM as tolerated, WBAT Rt UE  Required Braces or Orthoses: Sling Restrictions Weight Bearing Restrictions: Yes RUE Weight Bearing: Weight bearing as tolerated    Mobility  Bed Mobility                  Transfers                    Ambulation/Gait                 Stairs            Wheelchair Mobility    Modified Rankin (Stroke Patients Only) Modified Rankin (Stroke Patients Only) Pre-Morbid Rankin Score: Moderate disability Modified Rankin: Moderately severe disability     Balance                                    Cognition Arousal/Alertness: Awake/alert Behavior During Therapy: WFL for tasks assessed/performed;Anxious Overall Cognitive Status: History of cognitive impairments - at baseline                      Exercises General Exercises - Lower Extremity Ankle Circles/Pumps: AROM;Right;AAROM;Left;10 reps  (assisted DF; resisted PF) Quad Sets: Left;AROM;5 reps (pt with difficulty isolating quads) Heel Slides: AAROM;Strengthening;Left;5 reps (resisted extension) Hip ABduction/ADduction: AAROM;Left;5 reps (assisted abduction, resisted adduction)    General Comments General comments (skin integrity, edema, etc.): Family departing on arrival. Patient supine with reports of Rt shoulder pain. Sling in poor position with Rt humerus in slight extension to rest on mattress. Repositioned sling and propped upper arm on blanket (to keep humerus more anterior) and pillow under her elbow to forearm. Left strap to sling undone due to reports of neck soreness (no redness of skin noted). Educated Charity fundraiser and NT re: positioning as described to allow for breaks from having sling tightened around her neck.      Pertinent Vitals/Pain Pain Assessment: Faces Faces Pain Scale: Hurts whole lot Pain Location: Rt shoulder Pain Descriptors / Indicators: Grimacing;Guarding;Sore Pain Intervention(s): Limited activity within patient's tolerance;Monitored during session;Repositioned    Home Living                      Prior Function            PT Goals (current goals can now be found in the care plan section) Acute Rehab  PT Goals Patient Stated Goal: to get stronger  Time For Goal Achievement: 11/10/16 Progress towards PT goals: Not progressing toward goals - comment (limited session)    Frequency    Min 3X/week      PT Plan Current plan remains appropriate    Co-evaluation             End of Session   Activity Tolerance: Patient tolerated treatment well (limited session; RN and NT in to prepare pt for transport) Patient left: in bed;with nursing/sitter in room     Time: 4431-5400 PT Time Calculation (min) (ACUTE ONLY): 15 min  Charges:  $Therapeutic Exercise: 8-22 mins                    G CodesScherrie November Zlata Alcaide 11/06/16, 3:50 PM Pager [phone]

## 2016-10-31 NOTE — Care Management Note (Signed)
Case Management Note  Patient Details  Name: Amber [name] MRN: [id number] Date of Birth: September 16, 1956  Subjective/Objective:                    Action/Plan: Plan is for patient to discharge to SNF today. No further needs per CM.   Expected Discharge Date:  10/28/16               Expected Discharge Plan:  Home w Home Health Services  In-House Referral:  Clinical Social Work  Discharge planning Services  CM Consult  Post Acute Care Choice:    Choice offered to:     DME Arranged:    DME Agency:     HH Arranged:    HH Agency:     Status of Service:  Completed, signed off  If discussed at [employer], dates discussed:    Additional Comments:  Kermit Balo, RN 10/31/2016, 11:07 AM

## 2016-10-31 NOTE — Progress Notes (Signed)
STROKE TEAM PROGRESS NOTE   SUBJECTIVE (INTERVAL HISTORY) Patient up in   the bed. Daughter at bedside.  TEE y`day showed PFO. No new complaints    OBJECTIVE Temp:  [97.7 F (36.5 C)-98.8 F (37.1 C)] 98.4 F (36.9 C) (12/12 0930) Pulse Rate:  [84-141] 96 (12/12 1013) Cardiac Rhythm: Sinus tachycardia (12/11 1900) Resp:  [9-20] 20 (12/12 0930) BP: (123-180)/(87-131) 140/88 (12/12 0930) SpO2:  [96 %-100 %] 99 % (12/12 0930)  CBC:   Recent Labs Lab 10/26/16 0010  10/29/16 0231 10/31/16 0335  WBC 9.8  < > 11.8* 12.9*  NEUTROABS 5.5  --   --   --   HGB 10.4*  < > 9.3* 8.9*  HCT 31.6*  < > 28.1* 27.4*  MCV 77.8*  < > 76.8* 78.1  PLT 203  < > 220 252  < > = values in this interval not displayed.  Basic Metabolic Panel:   Recent Labs Lab 10/27/16 0829 10/29/16 0231  NA 139 140  K 4.1 4.5  CL 110 112*  CO2 20* 18*  GLUCOSE 89 95  BUN 9 12  CREATININE 0.89 0.94  CALCIUM 8.3* 8.7*    Lipid Panel:     Component Value Date/Time   CHOL 105 10/27/2016 0532   TRIG 55 10/27/2016 0532   HDL 61 10/27/2016 0532   CHOLHDL 1.7 10/27/2016 0532   VLDL 11 10/27/2016 0532   LDLCALC 33 10/27/2016 0532   HgbA1c:  Lab Results  Component Value Date   HGBA1C 4.7 (L) 10/27/2016   Urine Drug Screen:     Component Value Date/Time   LABOPIA POSITIVE (A) 10/26/2016 1750   COCAINSCRNUR NONE DETECTED 10/26/2016 1750   LABBENZ POSITIVE (A) 10/26/2016 1750   AMPHETMU NONE DETECTED 10/26/2016 1750   THCU NONE DETECTED 10/26/2016 1750   LABBARB NONE DETECTED 10/26/2016 1750      IMAGING I have personally reviewed the radiological images below and agree with the radiology interpretations.  Ct Head Wo Contrast 10/26/2016 Findings concerning for subacute ischemia in the posterior right frontal lobe.   Mr Brain Wo Contrast 10/26/2016 Numerous supra and infratentorial acute infarcts spanning multiple vascular territories, largest in RIGHT frontoparietal lobes corresponding to CT  abnormality. Findings consistent with central embolic phenomena. Mild to moderate chronic small vessel ischemic disease, advanced for age.  EEG -  This awake and asleep EEG is mildly abnormal due to mild diffuse slowing of the waking background.  Clinical Correlation of the above findings indicates diffuse cerebral dysfunction that is non-specific in etiology and can be seen with hypoxic/ischemic injury, toxic/metabolic encephalopathies, neurodegenerative disorders, or medication effect.  The absence of epileptiform discharges does not rule out a clinical diagnosis of epilepsy.  Clinical correlation is advised.  Dg Shoulder Right 10/26/2016 Deformity of glenoid rim most pronounced superiorly may represent an impacted fracture. No dislocation. Ossific densities surrounding proximal humerus may represent intra-articular bodies.   Mr [facility] and neck Wo Contrast 10/26/2016 No proximal intracranial stenosis or dissection. No extracranial carotid bifurcation, proximal ICA, or visible great vessel stenosis. Dominant RIGHT vertebral is the sole contributor to the basilar. LEFT vertebral terminates in PICA. Given the relatively unimpressive appearance of the extracranial and intracranial circulation, a cardiac source of emboli is suspected. No large vessel vasculitic change. Mild irregularity of the distal MCA and PCA branches more suggestive of intracranial atherosclerotic change   TCD bubble study - suspect moderate right to left shunt, however due to background noise and patient's snoring, lack of  window, it difficult to be confirmed at this time. We will do TEE on Monday  LE venous doppler  10/27/2016 No evidence of acute  deep or superficial vein thrombosis in the lower extremities.   RUE venous doppler  10/27/2016 Negative for deep vein thrombosis. Positive for chronic appearing thrombus in a short segment of the cephalic vein in the upper arm. All other veins appeared open and patent without  thrombus.  TTE  Left ventricle: The cavity size was normal. There was mild   concentric hypertrophy. Systolic function was normal. The   estimated ejection fraction was in the range of 60% to 65%. Wall   motion was normal; there were no regional wall motion   abnormalities. There was an increased relative contribution of   atrial contraction to ventricular filling. Doppler parameters are   consistent with abnormal left ventricular relaxation (grade 1   diastolic dysfunction). - Mitral valve: Calcified annulus. Moderate focal calcification of   the anterior leaflet (medial segment(s)). - Pulmonary arteries: Systolic pressure could not be accurately   estimated.  TEE -positive for PFO  LT EEG This was a moderately abnormal continuous video EEG due to some background slowing and diffuse slow activity. Rare isolated sharp waves were seen in the left central regions, suggesting the presence of a diffuse cerebral disturbance with mild focal cortical irritability in the left central regions. Episodes of confusion with left arm shaking did not have an electrographic correlate. The EEG became obscured by electrode artifact in the early morning and was disconnected.     PHYSICAL EXAM  Temp:  [97.7 F (36.5 C)-98.8 F (37.1 C)] 98.4 F (36.9 C) (12/12 0930) Pulse Rate:  [84-141] 96 (12/12 1013) Resp:  [9-20] 20 (12/12 0930) BP: (123-180)/(87-131) 140/88 (12/12 0930) SpO2:  [96 %-100 %] 99 % (12/12 0930)  General - Well nourished, well developed, depressed mood in crying.  Ophthalmologic - Fundi not visualized due to distress.  Cardiovascular - no murmur or gallop Mental Status -  Level of arousal and orientation to time, place, and person were intact. Language including expression, naming, repetition, comprehension was assessed and found intact. Fund of Knowledge was assessed and was intact.  Cranial Nerves II - XII - II - Visual field intact OU. III, IV, VI - Extraocular movements  intact. V - Facial sensation intact bilaterally. VII - Facial movement intact bilaterally. VIII - Hearing & vestibular intact bilaterally. X - Palate elevates symmetrically. XI - Chin turning & shoulder shrug intact bilaterally. XII - Tongue protrusion intact.  Motor Strength - The patient's strength was normal at LUE, RLE, however LLE proximal 5-/5 and distal DF 3+/5 and PF 4/5, RUE distally 5/5 but proximally painful shoulder with limited ROM and pronator drift was absent.  Bulk was normal and fasciculations were absent.   Motor Tone - Muscle tone was assessed at the neck and appendages and was normal.  Reflexes - The patient's reflexes were 1+ in all extremities and she had no pathological reflexes.  Sensory - Light touch, temperature/pinprick were assessed and were symmetrical.    Coordination - The patient had normal movements in the left hand with no ataxia or dysmetria.  Tremor was absent.  Gait and Station - not tested   ASSESSMENT/PLAN [name] is a 60 y.o. female with history of HTN, NIDDM, HFpEF, seizures, chronic pain, and asthma on chronic prednisone presenting with new onset L arm tremor. The CT showed posterior right frontal lobe infarct.   She did not  receive IV t-PA.   StrokeA:  Multiple bilateral anterior and supratentorial infarcts, embolic secondary to unknown source. Due to unremarkable vessel images, cardioembolic most likely. DVT negative but TCD bubble study suspected positive PFO. TEE scheduled on Monday. Hypercoagulable work up pending. Feels vasculitis less likely as pt is on steroids daily for asthma/COPD, but still in DDx. Endocarditis also less likely due to normal WBC and afebrile.   MRI  numerous supra and infratentorial bilateral embolic infarcts, largest at right MCA frontal cortical region  MRA head and neck unremarkable  2D echo EF 60-65%   TCD with bubble study - lack of window and too much noise, had to monitoring via [facility] and [facility], suspect  moderate PFO,   confirmed by TEE  LE and RUE venous doppler - Negative for DVT. Positive for chronic appearing thrombus at cephalic vein.    LDL - 33  HgbA1c 4.7  Hypercoagulable work up  (neg - lupus, anti DNA, ANA,  , ESR, HIB, RPR, VB12, TSH.0 Pending - CL ab, beta 2 glyco) Diet Carb Modified Fluid consistency: Thin; Room service appropriate? Yes  aspirin 81 prior to admission, now on ASA '325mg'$ .   Lovenox 40 mg sq daily for VTE prophy  Patient counseled to be compliant with her antithrombotic medications  Ongoing aggressive stroke risk factor management  Therapy recommendations:  SNF  Disposition:  SNF   Partial seizure  Was witness in ED with left arm shaking  Hx of seizure not on AEDs, last seizure 01/2014  Loaded with keppra '1000mg'$  IV in ER  On Vimpat '100mg'$  bid now  EEG showed mild slowing and no seizure  Still had intermittent left arm shaking, LTM ordered and result pending   EEG :mild focal cortical irritability in the left central regions  Right shoulder pain  Pt had recent fall  Right shoulder X-ray concerning for fracture  Ortho consult - WBAT, sling for comfort - ROM as tolerated - will treat nonop  Currently Left UE in  arm sling  Hypertension  Stable  Permissive hypertension (OK if < 220/120) but gradually normalize in 5-7 days  Long-term BP goal normotensive  Hyperlipidemia  Home meds:  lipitor 10, resumed in hospital  LDL 33, goal < 70  Continue statin at discharge  Other Stroke Risk Factors  Overweight, Body mass index is 20.9 kg/m., recommend weight loss, diet and exercise as appropriate   Other Active Problems  Chronic pain. Oxymorphone  Asthma on chronic steroids  Hospital day # 5  I have personally examined this patient, reviewed notes, independently viewed imaging studies, participated in medical decision making and plan of care.ROS completed by me personally and pertinent positives fully documented  I have made any  additions or clarifications directly to the above note.  TEE   confirms right to left intracardiac shunt. 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 Recommend interventional cardiology consultation with Dr. Sherren Mocha to arrange for elective endovascular PFO closure. Transferred for rehabilitation to skilled nursing facility. I had a long discussion with the patient and daughter at the bedside who agree with the above plan and answered questions. Discussed with Dr. Allyson Sabal and Dr. Burt Knack. Greater than 50% time during this 25 minute visit was spent on counseling and coordination of care about stroke, PFO and paradoxical embolism Stroke team will sign off. Kindly call for questions. Follow-up as an outpatient in the stroke clinic in 2 months.  Antony Contras, MD Medical Director  [facility] Pager: [phone] 10/31/2016 1:18 PM   To contact Stroke Continuity provider, please refer to [email or web address] After hours, contact General Neurology

## 2016-10-31 NOTE — Progress Notes (Signed)
RN gave report to [place] at [place].

## 2016-10-31 NOTE — Clinical Social Work Note (Signed)
Pt is ready for discharge today and will go to [facility]. Facility is ready to admit pt as they have received discharge information. Pt and family is agreeable to discharge plan. RN will call report. [facility] will provide transportation will provide transportation.   Dede Query, MSW, LCSW  Clinical Social Worker  [phone]

## 2016-10-31 NOTE — Discharge Summary (Addendum)
Physician Discharge Summary  Amber [name] MRN: [id number] DOB/AGE: 1956-05-28 60 y.o.  PCP: Sandi Mariscal, MD   Admit date: 10/25/2016 Discharge date: 10/31/2016  Discharge Diagnoses:    Principal Problem:   Embolic stroke [facility]) Active Problems:   Microcytic anemia   Asthma   Diabetes mellitus type 2, controlled ([place])   Essential hypertension   Pulmonary hypertension   Seizure ([place])   Cerebrovascular accident (CVA) due to embolism (HCC)   Tachycardia   CVA (cerebral vascular accident) ([place])    Follow-up recommendations Follow-up with PCP in 3-5 days , including all  additional recommended appointments as below Follow-up CBC, CMP in 3-5 days Patient would need to follow-up with cardiology, Dr. Burt Knack for possible PFO closure Follow-up with  Garvin Fila, MD, as scheduled for CVA  Continue right upper extremity sling for comfort,     Current Discharge Medication List    START taking these medications   Details  enoxaparin (LOVENOX) 40 MG/0.4ML injection Inject 0.4 mLs (40 mg total) into the skin daily. Qty: 14 Syringe, Refills: 0    lacosamide 100 MG TABS Take 1 tablet (100 mg total) by mouth 2 (two) times daily. Qty: 60 tablet, Refills: 2      CONTINUE these medications which have CHANGED   Details  aspirin EC 325 MG EC tablet Take 1 tablet (325 mg total) by mouth daily. Qty: 30 tablet, Refills: 0    diazepam (VALIUM) 5 MG tablet Take 0.5 tablets (2.5 mg total) by mouth 2 (two) times daily. Qty: 14 tablet, Refills: 0    furosemide (LASIX) 20 MG tablet Take 1 tablet (20 mg total) by mouth daily.    Oxymorphone HCl, Crush Resist, 20 MG PO T12A Take 20 mg by mouth every 12 (twelve) hours. Qty: 2 tablet, Refills: 0      CONTINUE these medications which have NOT CHANGED   Details  albuterol (PROVENTIL HFA;VENTOLIN HFA) 108 (90 BASE) MCG/ACT inhaler Inhale 2 puffs into the lungs every 6 (six) hours as needed for wheezing or shortness of breath.    amLODipine  (NORVASC) 5 MG tablet Take 5 mg by mouth daily.    atorvastatin (LIPITOR) 10 MG tablet Take 10 mg by mouth daily. Refills: 5    feeding supplement, ENSURE ENLIVE, (ENSURE ENLIVE) LIQD Take 237 mLs by mouth 2 (two) times daily between meals. Qty: 60 Bottle, Refills: 0    Fluticasone-Salmeterol (ADVAIR) 250-50 MCG/DOSE AEPB Inhale 1 puff into the lungs 2 (two) times daily.    folic acid (FOLVITE) 1 MG tablet Take 1 mg by mouth daily.    gabapentin (NEURONTIN) 300 MG capsule Take 1 capsule (300 mg total) by mouth 2 (two) times daily. Qty: 60 capsule, Refills: 0    lactulose (CHRONULAC) 10 GM/15ML solution Take 30 mLs (20 g total) by mouth 2 (two) times daily as needed for severe constipation. Qty: 240 mL, Refills: 0    LINZESS 145 MCG CAPS capsule Take 1 capsule (145 mcg total) by mouth daily as needed (constipation). Qty: 30 capsule, Refills: 0    metFORMIN (GLUCOPHAGE-XR) 500 MG 24 hr tablet Take 500 mg by mouth daily with breakfast.    metoprolol succinate (TOPROL-XL) 25 MG 24 hr tablet Take 25 mg by mouth daily.    pantoprazole (PROTONIX) 40 MG tablet Take 1 tablet (40 mg total) by mouth 2 (two) times daily before a meal. Qty: 60 tablet, Refills: 3    polyethylene glycol (MIRALAX / GLYCOLAX) packet Take 17 g by mouth 2 (  two) times daily. Qty: 14 each, Refills: 0    predniSONE (DELTASONE) 10 MG tablet Take 15 mg by mouth daily with breakfast.    promethazine (PHENERGAN) 25 MG tablet Take 1 tablet by mouth every 6 (six) hours as needed for nausea or vomiting. nausea Refills: 0    Vitamin D, Ergocalciferol, (DRISDOL) 50000 UNITS CAPS capsule Take 1 capsule (50,000 Units total) by mouth every 7 (seven) days. Sunday Qty: 30 capsule, Refills: 0    VITAMIN E PO Take 1 capsule by mouth daily.      STOP taking these medications     HYDROcodone-acetaminophen (NORCO/VICODIN) 5-325 MG tablet      potassium chloride SA (K-DUR,KLOR-CON) 20 MEQ tablet          Discharge  Condition: Stable  Discharge Instructions Get Medicines reviewed and adjusted: Please take all your medications with you for your next visit with your Primary MD  Please request your Primary MD to go over all hospital tests and procedure/radiological results at the follow up, please ask your Primary MD to get all Hospital records sent to his/her office.  If you experience worsening of your admission symptoms, develop shortness of breath, life threatening emergency, suicidal or homicidal thoughts you must seek medical attention immediately by calling 911 or calling your MD immediately if symptoms less severe.  You must read complete instructions/literature along with all the possible adverse reactions/side effects for all the Medicines you take and that have been prescribed to you. Take any new Medicines after you have completely understood and accpet all the possible adverse reactions/side effects.   Do not drive when taking Pain medications.   Do not take more than prescribed Pain, Sleep and Anxiety Medications  Special Instructions: If you have smoked or chewed Tobacco in the last 2 yrs please stop smoking, stop any regular Alcohol and or any Recreational drug use.  Wear Seat belts while driving.  Please note  You were cared for by a hospitalist during your hospital stay. Once you are discharged, your primary care physician will handle any further medical issues. Please note that NO REFILLS for any discharge medications will be authorized once you are discharged, as it is imperative that you return to your primary care physician (or establish a relationship with a primary care physician if you do not have one) for your aftercare needs so that they can reassess your need for medications and monitor your lab values.     Allergies  Allergen Reactions  . Ivp Dye [Iodinated Diagnostic Agents] Itching      Disposition: SNF   Consults:  Neurology Orthopedics     Significant  Diagnostic Studies:  Dg Chest 2 View  Result Date: 10/27/2016 CLINICAL DATA:  Stroke 2 days ago EXAM: CHEST  2 VIEW COMPARISON:  08/22/2016 FINDINGS: Cardiomegaly again noted. No infiltrate or pleural effusion. No pulmonary edema. Stable left basilar scarring. Metallic fixation rods thoracic spine again noted. Degenerative changes bilateral shoulders. IMPRESSION: No active cardiopulmonary disease. Cardiomegaly again noted. Stable left basilar scarring. Electronically Signed   By: Natasha Mead M.D.   On: 10/27/2016 16:44   Dg Shoulder Right  Result Date: 10/26/2016 CLINICAL DATA:  60 y/o F; status post fall with right shoulder pain. EXAM: RIGHT SHOULDER - 2+ VIEW COMPARISON:  05/19/2016 right shoulder radiographs. FINDINGS: The humeral head is seated on the glenoid. There is deformity of the glenoid rim most pronounced superiorly probably representing impacted fracture and there are small calcific densities surrounding the proximal humerus which  may represent intra-articular bodies. Clavicle is obscured by the mandible. Chronic right upper posterior lateral rib fractures. IMPRESSION: Deformity of glenoid rim most pronounced superiorly may represent an impacted fracture. No dislocation. Ossific densities surrounding proximal humerus may represent intra-articular bodies. Electronically Signed   By: Kristine Garbe M.D.   On: 10/26/2016 15:11   Ct Head Wo Contrast  Result Date: 10/26/2016 CLINICAL DATA:  Fall 2 weeks ago. Fall backward after walker malfunction. Focal seizure. EXAM: CT HEAD WITHOUT CONTRAST TECHNIQUE: Contiguous axial images were obtained from the base of the skull through the vertex without intravenous contrast. COMPARISON:  Head CT 02/28/2016 FINDINGS: Brain: Focal cortical hypodensity in the posterior right frontal lobe concerning for subacute ischemia. No intracranial hemorrhage. Background chronic small vessel ischemia is similar. No subdural or extra-axial fluid collection. No  hydrocephalus. Vascular: Atherosclerosis of skullbase vasculature without hyperdense vessel or abnormal calcification. Skull: No acute fracture. Stable sclerotic focus in the right frontal bone. Sinuses/Orbits: Paranasal sinuses and mastoid air cells are clear. The visualized orbits are unremarkable. Other: None. IMPRESSION: Findings concerning for subacute ischemia in the posterior right frontal lobe. These results were called by telephone at the time of interpretation on 10/26/2016 at 1:34 am to Dr. Delora Fuel , who verbally acknowledged these results. Electronically Signed   By: Jeb Levering M.D.   On: 10/26/2016 01:34   Mr Jodene Nam Head Wo Contrast  Result Date: 10/26/2016 CLINICAL DATA:  Suspected emboli. Multiple acute strokes. Seizure activity. EXAM: MRA NECK WITHOUT AND WITH CONTRAST MRA HEAD WITHOUT CONTRAST TECHNIQUE: Multiplanar and multiecho pulse sequences of the neck were obtained without and with intravenous contrast. Angiographic images of the neck were obtained using MRA technique without and with intravenous contrast.; Angiographic images of the Circle of Willis were obtained using MRA technique without intravenous contrast. CONTRAST:  11m MULTIHANCE GADOBENATE DIMEGLUMINE 529 MG/ML IV SOLN COMPARISON:  MR brain earlier today. FINDINGS: MRA NECK FINDINGS Marked dolichoectasia that suggesting longstanding hypertension. No visible ascending aortic dissection or proximal great vessel stenosis. Carotid bifurcations free of disease. No cervical ICA dissection or fibromuscular dysplasia. RIGHT vertebral is the sole contributor to the basilar. No flow-limiting stenosis through the neck or proximal intracranial segments. LEFT vertebral is poorly seen at its origin and there may be a significant ostial stenosis. In the neck the LEFT vertebral ascends in a fairly normal caliber, not as large as the RIGHT. Intracranially, it terminates in PICA. MRA HEAD FINDINGS Minor non stenotic atheromatous change in  the cavernous and supraclinoid ICA segments bilaterally. Minor irregularity the anterior and middle cerebral arteries without flow-limiting proximal stenosis. No MCA branch occlusion. Basilar artery hypoplastic. This is in part due to BILATERAL fetal PCA anatomy. The RIGHT vertebral is the sole contributor to the basilar. LEFT vertebral terminates in PICA. Both PICA ease, and superior cerebellar arteries are patent. Anterior inferior cerebral artery is poorly visualized. Distal MCA and PCA branches are mildly irregular consistent with intracranial atherosclerotic change. No visible saccular aneurysm. IMPRESSION: No proximal intracranial stenosis or dissection. No extracranial carotid bifurcation, proximal ICA, or visible great vessel stenosis. Dominant RIGHT vertebral is the sole contributor to the basilar. LEFT vertebral terminates in PICA. Given the relatively unimpressive appearance of the extracranial and intracranial circulation, a cardiac source of emboli is suspected. No large vessel vasculitic change. Mild irregularity of the distal MCA and PCA branches more suggestive of intracranial atherosclerotic change Electronically Signed   By: JStaci RighterM.D.   On: 10/26/2016 16:44   Mr MJodene NamNeck  W Wo Contrast  Result Date: 10/26/2016 CLINICAL DATA:  Suspected emboli. Multiple acute strokes. Seizure activity. EXAM: MRA NECK WITHOUT AND WITH CONTRAST MRA HEAD WITHOUT CONTRAST TECHNIQUE: Multiplanar and multiecho pulse sequences of the neck were obtained without and with intravenous contrast. Angiographic images of the neck were obtained using MRA technique without and with intravenous contrast.; Angiographic images of the Circle of Willis were obtained using MRA technique without intravenous contrast. CONTRAST:  63m MULTIHANCE GADOBENATE DIMEGLUMINE 529 MG/ML IV SOLN COMPARISON:  MR brain earlier today. FINDINGS: MRA NECK FINDINGS Marked dolichoectasia that suggesting longstanding hypertension. No visible  ascending aortic dissection or proximal great vessel stenosis. Carotid bifurcations free of disease. No cervical ICA dissection or fibromuscular dysplasia. RIGHT vertebral is the sole contributor to the basilar. No flow-limiting stenosis through the neck or proximal intracranial segments. LEFT vertebral is poorly seen at its origin and there may be a significant ostial stenosis. In the neck the LEFT vertebral ascends in a fairly normal caliber, not as large as the RIGHT. Intracranially, it terminates in PICA. MRA HEAD FINDINGS Minor non stenotic atheromatous change in the cavernous and supraclinoid ICA segments bilaterally. Minor irregularity the anterior and middle cerebral arteries without flow-limiting proximal stenosis. No MCA branch occlusion. Basilar artery hypoplastic. This is in part due to BILATERAL fetal PCA anatomy. The RIGHT vertebral is the sole contributor to the basilar. LEFT vertebral terminates in PICA. Both PICA ease, and superior cerebellar arteries are patent. Anterior inferior cerebral artery is poorly visualized. Distal MCA and PCA branches are mildly irregular consistent with intracranial atherosclerotic change. No visible saccular aneurysm. IMPRESSION: No proximal intracranial stenosis or dissection. No extracranial carotid bifurcation, proximal ICA, or visible great vessel stenosis. Dominant RIGHT vertebral is the sole contributor to the basilar. LEFT vertebral terminates in PICA. Given the relatively unimpressive appearance of the extracranial and intracranial circulation, a cardiac source of emboli is suspected. No large vessel vasculitic change. Mild irregularity of the distal MCA and PCA branches more suggestive of intracranial atherosclerotic change Electronically Signed   By: JStaci RighterM.D.   On: 10/26/2016 16:44   Mr Brain Wo Contrast  Result Date: 10/26/2016 CLINICAL DATA:  Tremor, fell on Thanksgiving with head injury. Headache and dizziness. History of diabetes, seizures,  hypertension. EXAM: MRI HEAD WITHOUT CONTRAST TECHNIQUE: Multiplanar, multiecho pulse sequences of the brain and surrounding structures were obtained without intravenous contrast. COMPARISON:  CT HEAD October 26, 2016 at 0101 hours FINDINGS: BRAIN: Confluent up to 4.2 cm reduced diffusion RIGHT posterior frontal parietal lobe, about the central sulcus. Corresponding low ADC values and faint T2 hyperintense signal. Numerous sub cm foci of reduced diffusion bilateral frontal, parietal and bilateral occipital lobes. Symmetric reduced diffusion mesial thalamus suggests artery of [place]. Multiple foci of reduced diffusion measure up to 11 mm in the cerebellum with low ADC values. Ventricles and sulci are normal for patient's age. Patchy to confluent supratentorial white matter FLAIR T2 hyperintensities exclusive of the aforementioned abnormalities. No midline shift, mass effect or masses. No abnormal extra-axial fluid collections. VASCULAR: Normal major intracranial vascular flow voids present at skull base. SKULL AND UPPER CERVICAL SPINE: No abnormal sellar expansion. No suspicious calvarial bone marrow signal. Craniocervical junction maintained. Grade 1 C3-4 and C4-5 anterolisthesis. SINUSES/ORBITS: The mastoid air-cells and included paranasal sinuses are well-aerated. The included ocular globes and orbital contents are non-suspicious. OTHER: Patient is edentulous. 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 Findings consistent  with central embolic phenomena. Mild to moderate chronic small vessel ischemic disease, advanced for age. Electronically Signed   By: Awilda Metro M.D.   On: 10/26/2016 05:19   Nm Pulmonary Perf And Vent  Result Date: 10/27/2016 CLINICAL DATA:  Shortness of breath. EXAM: NUCLEAR MEDICINE VENTILATION - PERFUSION LUNG SCAN TECHNIQUE: Ventilation images were  obtained in multiple projections using inhaled aerosol Tc-84m DTPA. Perfusion images were obtained in multiple projections after intravenous injection of Tc-39m MAA. RADIOPHARMACEUTICALS:  31.6 mCi Technetium-67m DTPA aerosol inhalation and 4.38 mCi Technetium-75m MAA IV COMPARISON:  Chest x-ray today and chest CT 05/04/2016 FINDINGS: Ventilation: No focal ventilation defect. Perfusion: No wedge shaped peripheral perfusion defects to suggest acute pulmonary embolism. IMPRESSION: Normal VQ scan without evidence of pulmonary embolism. Electronically Signed   By: Elberta Fortis M.D.   On: 10/27/2016 16:59    2-D echo LV EF: 60% -   65%  ------------------------------------------------------------------- Indications:      CVA 436.  ------------------------------------------------------------------- History:   Risk factors:  New onset A-Fib. Seizure. Pulmonary hypertension. Hypertension. Diabetes mellitus.  ------------------------------------------------------------------- Study Conclusions  - Left ventricle: The cavity size was normal. There was mild   concentric hypertrophy. Systolic function was normal. The   estimated ejection fraction was in the range of 60% to 65%. Wall   motion was normal; there were no regional wall motion   abnormalities. There was an increased relative contribution of   atrial contraction to ventricular filling. Doppler parameters are   consistent with abnormal left ventricular relaxation (grade 1   diastolic dysfunction). - Mitral valve: Calcified annulus. Moderate focal calcification of   the anterior leaflet (medial segment(s)). - Pulmonary arteries: Systolic pressure could not be accurately   estimated.    TEE 12/11 Atrial septum: There was a patent foramen ovale. - Tricuspid valve: There was mild regurgitation.  Impressions:  - Positive bubble study. Present PFO.  Filed Weights   10/26/16 0011 10/26/16 1745 10/30/16 1302  Weight: 64.4 kg (142  lb) 48.9 kg (107 lb 12.9 oz) 48.5 kg (107 lb)     Microbiology: Recent Results (from the past 240 hour(s))  MRSA PCR Screening     Status: None   Collection Time: 10/26/16  7:35 PM  Result Value Ref Range Status   MRSA by PCR NEGATIVE NEGATIVE Final    Comment:        The GeneXpert MRSA Assay (FDA approved for NASAL specimens only), is one component of a comprehensive MRSA colonization surveillance program. It is not intended to diagnose MRSA infection nor to guide or monitor treatment for MRSA infections.        Blood Culture    Component Value Date/Time   SDES BLOOD LEFT ANTECUBITAL 02/28/2016 2000   SPECREQUEST  02/28/2016 2000    BOTTLES DRAWN AEROBIC AND ANAEROBIC BLUE 6CC RED 5CC   CULT  02/28/2016 2000    NO GROWTH 5 DAYS Performed at [facility]    REPTSTATUS 03/04/2016 FINAL 02/28/2016 2000      Labs: Results for orders placed or performed during the hospital encounter of 10/25/16 (from the past 48 hour(s))  Glucose, capillary     Status: Abnormal   Collection Time: 10/29/16 11:52 AM  Result Value Ref Range   Glucose-Capillary 122 (H) 65 - 99 mg/dL   Comment 1 Notify RN    Comment 2 Document in Chart   Glucose, capillary     Status: Abnormal   Collection Time: 10/29/16  4:53 PM  Result Value  Ref Range   Glucose-Capillary 153 (H) 65 - 99 mg/dL   Comment 1 Notify RN    Comment 2 Document in Chart   Glucose, capillary     Status: Abnormal   Collection Time: 10/29/16  9:31 PM  Result Value Ref Range   Glucose-Capillary 152 (H) 65 - 99 mg/dL   Comment 1 Notify RN    Comment 2 Document in Chart   Glucose, capillary     Status: Abnormal   Collection Time: 10/30/16  6:43 AM  Result Value Ref Range   Glucose-Capillary 159 (H) 65 - 99 mg/dL   Comment 1 Notify RN    Comment 2 Document in Chart   Glucose, capillary     Status: None   Collection Time: 10/30/16 11:26 AM  Result Value Ref Range   Glucose-Capillary 89 65 - 99 mg/dL   Comment 1  Notify RN    Comment 2 Document in Chart   Glucose, capillary     Status: Abnormal   Collection Time: 10/30/16  5:10 PM  Result Value Ref Range   Glucose-Capillary 222 (H) 65 - 99 mg/dL   Comment 1 Notify RN    Comment 2 Document in Chart   Glucose, capillary     Status: Abnormal   Collection Time: 10/30/16  9:54 PM  Result Value Ref Range   Glucose-Capillary 141 (H) 65 - 99 mg/dL   Comment 1 Notify RN    Comment 2 Document in Chart   CBC     Status: Abnormal   Collection Time: 10/31/16  3:35 AM  Result Value Ref Range   WBC 12.9 (H) 4.0 - 10.5 K/uL   RBC 3.51 (L) 3.87 - 5.11 MIL/uL   Hemoglobin 8.9 (L) 12.0 - 15.0 g/dL   HCT 27.4 (L) 36.0 - 46.0 %   MCV 78.1 78.0 - 100.0 fL   MCH 25.4 (L) 26.0 - 34.0 pg   MCHC 32.5 30.0 - 36.0 g/dL   RDW 16.3 (H) 11.5 - 15.5 %   Platelets 252 150 - 400 K/uL  Glucose, capillary     Status: None   Collection Time: 10/31/16  6:20 AM  Result Value Ref Range   Glucose-Capillary 96 65 - 99 mg/dL   Comment 1 Notify RN    Comment 2 Document in Chart      Lipid Panel     Component Value Date/Time   CHOL 105 10/27/2016 0532   TRIG 55 10/27/2016 0532   HDL 61 10/27/2016 0532   CHOLHDL 1.7 10/27/2016 0532   VLDL 11 10/27/2016 0532   LDLCALC 33 10/27/2016 0532     Lab Results  Component Value Date   HGBA1C 4.7 (L) 10/27/2016   HGBA1C 5.5 02/29/2016   HGBA1C 5.8 (H) 11/07/2015        HPI :*  Amber [name] is an 60 y.o. female who presented to the ED with c/c of left arm tremor, which began at about 3 AM on Wednesday. Dr. Roxanne Mins observed the intermittent jerking movements of her left arm and felt that they most likely represented partial seizure activity. She was loaded with 1000 mg Keppra IV.   She also has a history of recent fall on Thanksgiving, striking her head at that time with LOC. Since the fall she has had headache and dizziness.   She has a history of seizures, with last seizure occurring in March of 2015. She is not on  any anticonvulsant medication at home. At time of prior admission  for chest pain in October of 2016, she was taking Keppra.    A CT head obtained at 0054 this morning revealed findings concerning for subacute ischemia in the posterior right frontal lobe. MRI of the brain demonstrated numerous supratentorial and infratentorial acute infarcts in multiple vascular territories, largest in the right frontal parietal lobes. Neurology was consult did and felt this likely represented embolic infarcts from the heart. A large PFO was noted on transthoracic echo and a transesophageal echo is scheduled for 10/30/2016  Orthopedic consultation with Dr.Xu , right glenoid fracture with recommendations for arm sling, no operative treatment plan, range of motion and weightbearing as tolerated   HOSPITAL COURSE: *  Embolic CVA Multiple bilateral anterior and supratentorial infarcts, embolic secondary to unknown source  MRInumerous supra and infratentorial bilateral embolic infarcts, largest at right MCA frontal cortical region  MRAhead and neckunremarkable  2D Echo-LV EF: 60% - 65%  TCD with bubble study -Likely positiveTranscranial Doppler Bubble Study indicative of a moderateright to left intracardiac shunt. We will have TEE on Monday to further confirm.  RUE and BLE venous doppler -Positive for chronic appearing thrombus in a short segment of the cephalic vein in the upper arm. Negative for DVT.  Lower extremity DVT negative  EKG - sinus tach  TEE to look for embolic source.positive for PFO. Cardiology Dr Burt Knack will be notified by Dr Leonie Man for elective PFO closure. Neurology will call cardiology to discuss this per Dr Leonie Man   LDL 33  HgbA1c 4.7  UDS positive for opiates and benzodiazepines  Hypercoagulable work up negative (anti--DNA, ANA, cardiolipin antibody, homocystine [except this was mildly elevated], beta 2 glycoprotein, lupus anticoagulant, are all negative- CRP, ESR, HIV, RPR  are all negative)  VitB12, TSH  0.85  lovenox for VTE prophylaxis.  EEG unremarkable  Continue aspirin 325 mg as recommended by neurology,per dr cooper  start Plavix prior to PFO closure  SNF, does not qualify for CIR  Impacted fracture of the right shoulder,Right glenoid fx Orthopedics consult requested-Dr. Margarito Liner mx  Would continue with DVT prophylaxis for 2 weeks  Tachycardia-appears to be sinus, resume beta blocker EKG wnl, vq scan negative  Continue low-dose metoprolol   Seizure Neurology continues to have concerns for seizure activity -EEG  diffuse cerebral dysfunction that is non-specific in etiology  Loaded with keppra '1000mg'$  IV Keppra now changed over to vimpat   HTN: -Permissive hypertension for now, resume metoprolol and furosemide/K -Continue statin   NIDDM: Can potentially discontinue home meds, hemoglobin A1c 4.7, currently on low-dose metformin    Chronic pain Patient takes multiple narcotic medications at home  Oxymorphone,     Asthma: -Continue chronic prednisone   Anxiety-patient takes Valium at home  Minimize sedating and narcotic medications    Discharge Exam:   Blood pressure (!) 180/105, pulse (!) 108, temperature 98.1 F (36.7 C), temperature source Oral, resp. rate 19, height 5' (1.524 m), weight 48.5 kg (107 lb), SpO2 100 %.  General exam: Appears calm and comfortable  Respiratory system: Clear to auscultation. Respiratory effort normal. Cardiovascular system: S1 & S2 heard, RRR. No JVD, murmurs, rubs, gallops or clicks. No pedal edema. Gastrointestinal system: Abdomen is nondistended, soft and nontender. No organomegaly or masses felt. Normal bowel sounds heard. Central nervous system: Alert and oriented. No focal neurological deficits. Extremities: Symmetric 5 x 5 power. Skin: No rashes, lesions or ulcers Psychiatry: Judgement and insight appear normal. Mood & affect appropriate.      Follow-up  Information  Sandi Mariscal, MD. Call in 2 day(s).   Specialty:  Internal Medicine Why:  Hospital follow-up Contact information: [place] [address] [phone]        Antony Contras, MD. Call in 2 day(s).   Specialties:  Neurology, Radiology Why:  Called to confirm appointment, hospital follow-up for stroke Contact information: [address] [place] [address] [phone]        Sherren Mocha, MD. Call in 2 day(s).   Specialty:  Cardiology Why:  To confirm appointment, hospital follow-up for PFO closure  Contact information: [address] [place] [address] [phone]           Signed: Reyne Dumas 10/31/2016, 9:01 AM        Time spent >45 mins

## 2016-10-31 NOTE — Clinical Social Work Note (Signed)
Clinical Social Work Assessment  Patient Details  Name: Amber [name] MRN: [id number] Date of Birth: Nov 18, 1956  Date of referral:  10/31/16               Reason for consult:  Facility Placement, Discharge Planning                Permission sought to share information with:  Family Supports Permission granted to share information::  Yes, Verbal Permission Granted  Name::     Amber [name]  Relationship::  daughter  Contact Information:  [phone]  Housing/Transportation Living arrangements for the past 2 months:  [place] of Information:  Patient Patient Interpreter Needed:  None Criminal Activity/Legal Involvement Pertinent to Current Situation/Hospitalization:  No - Comment as needed Significant Relationships:  Adult Children Lives with:  Self Do you feel safe going back to the place where you live?  Yes Need for family participation in patient care:  Yes (Comment)  Care giving concerns:  No care giving concerns identified.   Social Worker assessment / plan:  CSW met with pt and family to address consult. CSW introduced herself and explained role of social work. CSW also explained the process of discharging to SNF with Medicaid only. Pt and family understand the limitations with Medicaid only and requirements. CSW initiated SNF search and will follow up with bed offers. Pt is ready for discharge today per MD. CSW will continue to follow to facilitate discharge today.   Employment status:  Disabled (Comment on whether or not currently receiving Disability) Insurance information:  Medicaid In [place] PT Recommendations:  [place] / Referral to community resources:  [place]  Patient/Family's Response to care:  Pt and family were appreciative of CSW support.   Patient/Family's Understanding of and Emotional Response to Diagnosis, Current Treatment, and Prognosis:  Pt and family understand that pt would benefit from SNF  placement, however understands the limitations due to [facility] insurance.   Emotional Assessment Appearance:  Appears stated age Attitude/Demeanor/Rapport:  Other (Appropriate) Affect (typically observed):  Accepting, Adaptable, Pleasant Orientation:  Oriented to Self, Oriented to Place, Oriented to  Time, Oriented to Situation Alcohol / Substance use:  Not Applicable Psych involvement (Current and /or in the community):  No (Comment)  Discharge Needs  Concerns to be addressed:  Adjustment to Illness Readmission within the last 30 days:  No Current discharge risk:  None Barriers to Discharge:  Other (Bed search)   Darden Dates, LCSW 10/31/2016, 12:13 PM

## 2016-10-31 NOTE — NC FL2 (Signed)
[place] MEDICAID FL2 LEVEL OF CARE SCREENING TOOL     IDENTIFICATION  Patient Name: Amber [name] Birthdate: 1956-05-16 Sex: female Admission Date (Current Location): 10/25/2016  [facility] and [place] Number:  Producer, television/film/video and Address:  [facility], [address] [place], [place] [address]      Provider Number: [id number]  Attending Physician Name and Address:  Richarda Overlie, MD  Relative Name and Phone Number:       Current Level of Care: Hospital Recommended Level of Care: Skilled Nursing Facility Prior Approval Number:    Date Approved/Denied:   PASRR Number: [id number]  Discharge Plan: Home    Current Diagnoses: Patient Active Problem List   Diagnosis Date Noted  . CVA (cerebral vascular accident) (HCC) 10/28/2016  . Tachycardia   . Seizure (HCC) 10/26/2016  . Embolic stroke (HCC) 10/26/2016  . New onset atrial fibrillation (HCC) 10/26/2016  . 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 Focal seizure (HCC)   . Shoulder pain, right   . Duodenitis   . Abdominal pain 11/07/2015  . Constipation 11/07/2015  . Abdominal distension   . Chest pain, musculoskeletal 08/24/2015  . Diabetes mellitus type 2, controlled (HCC) 07/27/2015  . Chronic back pain   . Frequent falls   . Essential hypertension   . Pulmonary hypertension   . Gout   . Asthma 06/12/2015  . ACS (acute coronary syndrome) (HCC) 06/12/2015  . Microcytic anemia 01/26/2015  . History of seizures 01/26/2015    Orientation RESPIRATION BLADDER Height & Weight     Self, Situation, Place, Time  Normal Continent Weight: 107 lb (48.5 kg) Height:  5' (152.4 cm)  BEHAVIORAL SYMPTOMS/MOOD NEUROLOGICAL BOWEL NUTRITION STATUS      Continent Diet (Carb Modified, Thin Liquids)  AMBULATORY STATUS COMMUNICATION OF NEEDS Skin   Limited Assist Verbally Normal                       Personal Care Assistance Level of Assistance  Bathing, Feeding,  Dressing Bathing Assistance: Limited assistance Feeding assistance: Independent Dressing Assistance: Limited assistance     Functional Limitations Info  Sight, Hearing, Speech Sight Info: Adequate Hearing Info: Adequate Speech Info: Adequate    SPECIAL CARE FACTORS FREQUENCY  PT (By licensed PT), OT (By licensed OT)     PT Frequency: 5 OT Frequency: 5            Contractures Contractures Info: Not present    Additional Factors Info  Code Status, Allergies Code Status Info: Full Code Allergies Info: Ivp Dye           Current Medications (10/31/2016):  This is the current hospital active medication list Current Facility-Administered Medications  Medication Dose Route Frequency Provider Last Rate Last Dose  .  stroke: mapping our early stages of recovery book   Does not apply Once Alberteen Sam, MD      . amLODipine (NORVASC) tablet 5 mg  5 mg Oral Daily Jinger Neighbors, NP   5 mg at 10/31/16 0918  . aspirin EC tablet 325 mg  325 mg Oral Daily Marvel Plan, MD   325 mg at 10/31/16 2025  . atorvastatin (LIPITOR) tablet 10 mg  10 mg Oral Daily Alberteen Sam, MD   10 mg at 10/31/16 0918  . diazepam (VALIUM) tablet 5 mg  5 mg Oral BID Alberteen Sam, MD   5 mg at 10/31/16 4270  .  enoxaparin (LOVENOX) injection 40 mg  40 mg Subcutaneous Q24H Marvel Plan, MD   40 mg at 10/30/16 2255  . feeding supplement (ENSURE ENLIVE) (ENSURE ENLIVE) liquid 237 mL  237 mL Oral BID BM Alberteen Sam, MD   237 mL at 10/31/16 0920  . gabapentin (NEURONTIN) capsule 300 mg  300 mg Oral BID Alberteen Sam, MD   300 mg at 10/31/16 8938  . insulin aspart (novoLOG) injection 0-5 Units  0-5 Units Subcutaneous QHS Alberteen Sam, MD   4 Units at 10/26/16 2157  . insulin aspart (novoLOG) injection 0-9 Units  0-9 Units Subcutaneous TID WC Alberteen Sam, MD   3 Units at 10/30/16 1719  . lacosamide (VIMPAT) tablet 100 mg  100 mg Oral BID Marvel Plan, MD   100 mg at  10/31/16 0918  . metoprolol (LOPRESSOR) injection 5 mg  5 mg Intravenous Q4H PRN Richarda Overlie, MD   5 mg at 10/31/16 0754  . morphine (MS CONTIN) 12 hr tablet 30 mg  30 mg Oral Q12H Richarda Overlie, MD   30 mg at 10/31/16 0918  . oxyCODONE-acetaminophen (PERCOCET/ROXICET) 5-325 MG per tablet 1-2 tablet  1-2 tablet Oral Q4H PRN Leda Gauze, NP   2 tablet at 10/31/16 [phone]  . pantoprazole (PROTONIX) EC tablet 40 mg  40 mg Oral BID AC Alberteen Sam, MD   40 mg at 10/31/16 0733  . polyethylene glycol (MIRALAX / GLYCOLAX) packet 17 g  17 g Oral BID Alberteen Sam, MD   17 g at 10/31/16 0928  . predniSONE (DELTASONE) tablet 15 mg  15 mg Oral Q breakfast Alberteen Sam, MD   15 mg at 10/31/16 0733  . technetium TC 93M diethylenetriame-pentaacetic acid (DTPA) injection 31.6 millicurie  31.6 millicurie Inhalation Once PRN Elie Goody, MD         Discharge Medications: Please see discharge summary for a list of discharge medications.  Relevant Imaging Results:  Relevant Lab Results:   Additional Information SSN:  [id number]  Dede Query, LCSW

## 2016-10-31 NOTE — Clinical Social Work Placement (Signed)
   CLINICAL SOCIAL WORK PLACEMENT  NOTE  Date:  10/31/2016  Patient Details  Name: Amber [name] MRN: [id number] Date of Birth: [birth year]  Clinical Social Work is seeking post-discharge placement for this patient at the Skilled  Nursing Facility level of care (*CSW will initial, date and re-position this form in  chart as items are completed):  Yes   Patient/family provided with [facility] Clinical Social Work Department's list of facilities offering this level of care within the geographic area requested by the patient (or if unable, by the patient's family).  Yes   Patient/family informed of their freedom to choose among providers that offer the needed level of care, that participate in Medicare, Medicaid or managed care program needed by the patient, have an available bed and are willing to accept the patient.  Yes   Patient/family informed of [facility]'s ownership interest in [facility] and [facility], as well as of the fact that they are under no obligation to receive care at these facilities.  PASRR submitted to EDS on       PASRR number received on       Existing PASRR number confirmed on 10/31/16     FL2 transmitted to all facilities in geographic area requested by pt/family on 10/31/16     FL2 transmitted to all facilities within larger geographic area on       Patient informed that his/her managed care company has contracts with or will negotiate with certain facilities, including the following:            Patient/family informed of bed offers received.  Patient chooses bed at       Physician recommends and patient chooses bed at      Patient to be transferred to   on  .  Patient to be transferred to facility by       Patient family notified on   of transfer.  Name of family member notified:        PHYSICIAN       Additional Comment:    _______________________________________________ Dede Query, LCSW 10/31/2016, 11:59 AM

## 2016-11-01 ENCOUNTER — Encounter: Payer: Self-pay | Admitting: Adult Health

## 2016-11-01 ENCOUNTER — Non-Acute Institutional Stay (SKILLED_NURSING_FACILITY): Payer: Medicaid Other | Admitting: Adult Health

## 2016-11-01 DIAGNOSIS — J4520 Mild intermittent asthma, uncomplicated: Secondary | ICD-10-CM | POA: Diagnosis not present

## 2016-11-01 DIAGNOSIS — I4891 Unspecified atrial fibrillation: Secondary | ICD-10-CM

## 2016-11-01 DIAGNOSIS — I6310 Cerebral infarction due to embolism of unspecified precerebral artery: Secondary | ICD-10-CM

## 2016-11-01 DIAGNOSIS — R569 Unspecified convulsions: Secondary | ICD-10-CM | POA: Diagnosis not present

## 2016-11-01 DIAGNOSIS — E1121 Type 2 diabetes mellitus with diabetic nephropathy: Secondary | ICD-10-CM

## 2016-11-01 NOTE — Progress Notes (Signed)
Patient ID: Amber [name], female   DOB: [birth year], 60 y.o.   MRN: [id number]   Location:   [facility] Room Number: 131-A Place of Service:  SNF (31)   CODE STATUS: Full Code until otherwise determined  Allergies  Allergen Reactions  . Ivp Dye [Iodinated Diagnostic Agents] Itching    Chief Complaint  Patient presents with  . Hospitalization Follow-up    Hospital Follow up    HPI:    She had a fall on Thanksgiving hitting her head and did have LOC. Since that time she has had headaches and dizziness. She did go to he ED for left arm tremor.  She was hospitalized for embolic cva with multiple bilateral anterior and supratentorial infarcts. She does have an PFO. She is here for short term rehab with her goal to return back home.    Past Medical History:  Diagnosis Date  . Arthritis   . Asthma   . Chronic pain   . Constipation   . Diabetes mellitus without complication (HCC)   . Embolic stroke (HCC)   . Falls frequently   . GERD (gastroesophageal reflux disease)   . Hypertension   . Pneumonia 10/2015  . Seizures (HCC)    last seizure March 2015    Past Surgical History:  Procedure Laterality Date  . APPENDECTOMY    . BACK SURGERY    . CHOLECYSTECTOMY    . TEE WITHOUT CARDIOVERSION N/A 10/30/2016   Procedure: TRANSESOPHAGEAL ECHOCARDIOGRAM (TEE);  Surgeon: Lars Masson, MD;  Location: [facility] ENDOSCOPY;  Service: Cardiovascular;  Laterality: N/A;    Social History   Social History  . Marital status: Single    Spouse name: N/A  . Number of children: N/A  . Years of education: N/A   Occupational History  . Not on file.   Social History Main Topics  . Smoking status: Former Smoker    Quit date: 2010  . Smokeless tobacco: Never Used  . Alcohol use No     Comment: admits to 2 drinks/week  . Drug use: No  . Sexual activity: No   Other Topics Concern  . Not on file   Social History Narrative  . No narrative on file   Family History    Problem Relation Age of Onset  . Kidney failure Mother   . Hypertension Sister   . Hypertension Brother       VITAL SIGNS BP 109/68   Pulse 96   Temp (!) 96.7 F (35.9 C) (Oral)   Resp 18   Ht 5' (1.524 m)   Wt 114 lb (51.7 kg)   SpO2 98%   BMI 22.26 kg/m   Patient's Medications  New Prescriptions   No medications on file  Previous Medications   ALBUTEROL (PROVENTIL HFA;VENTOLIN HFA) 108 (90 BASE) MCG/ACT INHALER    Inhale 2 puffs into the lungs every 6 (six) hours as needed for wheezing or shortness of breath.   AMLODIPINE (NORVASC) 5 MG TABLET    Take 5 mg by mouth daily.   ASPIRIN EC 325 MG EC TABLET    Take 1 tablet (325 mg total) by mouth daily.   ATORVASTATIN (LIPITOR) 10 MG TABLET    Take 10 mg by mouth daily.   DIAZEPAM (VALIUM) 5 MG TABLET    Take 0.5 tablets (2.5 mg total) by mouth 2 (two) times daily.   ENOXAPARIN (LOVENOX) 40 MG/0.4ML INJECTION    Inject 0.4 mLs (40 mg total) into the skin daily.  FEEDING SUPPLEMENT, ENSURE ENLIVE, (ENSURE ENLIVE) LIQD    Take 237 mLs by mouth 2 (two) times daily between meals.   FLUTICASONE-SALMETEROL (ADVAIR) 250-50 MCG/DOSE AEPB    Inhale 1 puff into the lungs 2 (two) times daily.   FOLIC ACID (FOLVITE) 1 MG TABLET    Take 1 mg by mouth daily.   GABAPENTIN (NEURONTIN) 300 MG CAPSULE    Take 1 capsule (300 mg total) by mouth 2 (two) times daily.   LACOSAMIDE 100 MG TABS    Take 1 tablet (100 mg total) by mouth 2 (two) times daily.   LACTULOSE (CHRONULAC) 10 GM/15ML SOLUTION    Take 30 mLs (20 g total) by mouth 2 (two) times daily as needed for severe constipation.   LINZESS 145 MCG CAPS CAPSULE    Take 1 capsule (145 mcg total) by mouth daily as needed (constipation).   METFORMIN (GLUCOPHAGE-XR) 500 MG 24 HR TABLET    Take 500 mg by mouth daily with breakfast.   METOPROLOL SUCCINATE (TOPROL-XL) 25 MG 24 HR TABLET    Take 25 mg by mouth daily.   OXYMORPHONE HCL, CRUSH RESIST, 20 MG PO T12A    Take 20 mg by mouth every 12  (twelve) hours.   PANTOPRAZOLE (PROTONIX) 40 MG TABLET    Take 1 tablet (40 mg total) by mouth 2 (two) times daily before a meal.   POLYETHYLENE GLYCOL (MIRALAX / GLYCOLAX) PACKET    Take 17 g by mouth 2 (two) times daily.   PREDNISONE (DELTASONE) 10 MG TABLET    Take 15 mg by mouth daily with breakfast.   PROMETHAZINE (PHENERGAN) 25 MG TABLET    Take 1 tablet by mouth every 6 (six) hours as needed for nausea or vomiting. nausea   TORSEMIDE (DEMADEX) 10 MG TABLET    Take 10 mg by mouth daily.   VITAMIN D, ERGOCALCIFEROL, (DRISDOL) 50000 UNITS CAPS CAPSULE    Take 1 capsule (50,000 Units total) by mouth every 7 (seven) days. Sunday  Modified Medications   No medications on file  Discontinued Medications   FUROSEMIDE (LASIX) 20 MG TABLET    Take 1 tablet (20 mg total) by mouth daily.   VITAMIN E PO    Take 1 capsule by mouth daily.     SIGNIFICANT DIAGNOSTIC EXAMS  10-26-16: ct of head: Findings concerning for subacute ischemia in the posterior right frontal lobe  10-26-16: mri of brain: Numerous supra and infratentorial acute infarcts spanning multiple vascular territories, largest in RIGHT frontoparietal lobes corresponding to CT abnormality. Findings consistent with central embolic phenomena. Mild to moderate chronic small vessel ischemic disease, advanced for Age.  10-26-16: right shoulder x-ray: Deformity of glenoid rim most pronounced superiorly may represent an impacted fracture. No dislocation. Ossific densities surrounding proximal humerus may represent intra-articular bodies.   10-27-16: mra of neck and head: No proximal intracranial stenosis or dissection. No extracranial carotid bifurcation, proximal ICA, or visible great vessel stenosis. Dominant RIGHT vertebral is the sole contributor to the basilar. LEFT vertebral terminates in PICA. Given the relatively unimpressive appearance of the extracranial and intracranial circulation, a cardiac source of emboli is suspected. No large  vessel vasculitic change. Mild irregularity of the distal MCA and PCA branches more suggestive of intracranial atherosclerotic Change  10-27-16; chest x-ray; No active cardiopulmonary disease. Cardiomegaly again noted. Stable left basilar scarring.  10-27-16: VQ scan: Normal VQ scan without evidence of pulmonary embolism.   10-30-16: TEE: - Left ventricle: Systolic function was normal. Wall motion was  normal; there were no regional wall motion abnormalities. - Aortic valve: Structurally normal valve. Trileaflet; normal thickness leaflets. There was no significant regurgitation. - Mitral valve: Structurally normal valve. There was trivial regurgitation. - Left atrium: No evidence of thrombus in the atrial cavity or appendage. - Right ventricle: The cavity size was normal. Wall thickness was normal. Systolic function was normal. - Right atrium: No evidence of thrombus in the atrial cavity or appendage. - Atrial septum: There was a patent foramen ovale. - Tricuspid valve: There was mild regurgitation. Impressions: - Positive bubble study. Present PFO.    LABS REVIEWED:   10-26-16: wbc 9.8; hgb 31.6; mcv 77.8 ;plt 203; glucose 87; bun 13; creat 1.00;  k+ 3.4; na++ 142; liver normal albumin 2.7 10-27-16; wbc 12.1; hgb 9.9; hct 29.7; mcv 76.2; plt 233; glucose 89; bun 9; creat 0.89; k+ 4.1; na++ 139; liver normal albumin 2.2 hgb a1c 4.2; chol 105; lld 33; trig 55 hdl 621; tsh 0.852; vit B 12: 549; sed rate 25; CRP 0.9; HIV; nr; RPR; nr 12-10-176: wbc 11.8; hgb 9.3; hct 28.1; mcv 76.8; plt 220; glucose 95; bun 12; creat 0.94; k+ 4.5; na++ 140; liver normal albumin 2.3  10-31-16: wbc 12.9; hgb 8.9; hct 27.4; mcv 78.1; plt 252    Review of Systems  Constitutional: Negative for malaise/fatigue.  Respiratory: Negative for cough and shortness of breath.   Cardiovascular: Negative for chest pain, palpitations and leg swelling.  Gastrointestinal: Negative for abdominal pain, constipation and heartburn.   Musculoskeletal: Positive for back pain. Negative for joint pain and myalgias.       Has chronic back pain   Skin: Negative.   Neurological: Negative for dizziness.  Psychiatric/Behavioral: The patient is not nervous/anxious.     Physical Exam  Constitutional: No distress.  Eyes: Conjunctivae are normal.  Neck: Neck supple. No JVD present. No thyromegaly present.  Cardiovascular: Normal rate, regular rhythm and intact distal pulses.   Respiratory: Effort normal and breath sounds normal. No respiratory distress. She has no wheezes.  GI: Soft. Bowel sounds are normal. She exhibits no distension. There is no tenderness.  Musculoskeletal: She exhibits no edema.  Able to move all extremities Has left upper extremity weakness Is status post right shoulder fracture; has sling    Lymphadenopathy:    She has no cervical adenopathy.  Neurological: She is alert.  Skin: Skin is warm and dry. She is not diaphoretic.  Psychiatric: She has a normal mood and affect.     ASSESSMENT/ PLAN:  1. Diabetes: hgb 4.7; will continue metformin xr 500 mg daily   2. Hypertension: will continue norvasc 5 mg daily asa 325 mg daily toprol xl 25 mg daily   3. Embolic CVA: is neurologically stable; will continue asa 325 mg daily   4. Afib: heart rate stable: will continue toprol xl 25 mg daily for rate control and will continue asa 325 mg daily   5. Dyslipidemia; ldl 33 will continue lipitor 10 mg daily   6. Seizures: no reports of further seizure activity: will continue vimpat 100 mg twice daily   7. Gerd: will continue protonix 40 mg twice daily   8. Asthma will continue advair 250/50 twice daily prednisone 15 mg daily albuterol 2 puffs every 6 hours as needed  Will check cbc; cmp   Time spent with patient  50  minutes >50% time spent counseling; reviewing medical record; tests; labs; and developing future plan of care     Synthia Innocent NP [facility]  Adult Medicine  Contact [phone] Monday  through Friday 8am- 5pm  After hours call [phone]

## 2016-11-02 ENCOUNTER — Non-Acute Institutional Stay (SKILLED_NURSING_FACILITY): Payer: Medicaid Other | Admitting: Internal Medicine

## 2016-11-02 ENCOUNTER — Encounter: Payer: Self-pay | Admitting: Internal Medicine

## 2016-11-02 DIAGNOSIS — T148XXA Other injury of unspecified body region, initial encounter: Secondary | ICD-10-CM

## 2016-11-02 DIAGNOSIS — D509 Iron deficiency anemia, unspecified: Secondary | ICD-10-CM

## 2016-11-02 DIAGNOSIS — G8929 Other chronic pain: Secondary | ICD-10-CM

## 2016-11-02 DIAGNOSIS — I6930 Unspecified sequelae of cerebral infarction: Secondary | ICD-10-CM

## 2016-11-02 DIAGNOSIS — K5903 Drug induced constipation: Secondary | ICD-10-CM

## 2016-11-02 DIAGNOSIS — J4520 Mild intermittent asthma, uncomplicated: Secondary | ICD-10-CM

## 2016-11-02 DIAGNOSIS — R739 Hyperglycemia, unspecified: Secondary | ICD-10-CM | POA: Diagnosis not present

## 2016-11-02 DIAGNOSIS — M5440 Lumbago with sciatica, unspecified side: Secondary | ICD-10-CM

## 2016-11-02 DIAGNOSIS — T50905A Adverse effect of unspecified drugs, medicaments and biological substances, initial encounter: Secondary | ICD-10-CM

## 2016-11-02 DIAGNOSIS — R569 Unspecified convulsions: Secondary | ICD-10-CM

## 2016-11-02 DIAGNOSIS — Q211 Atrial septal defect: Secondary | ICD-10-CM

## 2016-11-02 DIAGNOSIS — I10 Essential (primary) hypertension: Secondary | ICD-10-CM | POA: Diagnosis not present

## 2016-11-02 DIAGNOSIS — Q2112 Patent foramen ovale: Secondary | ICD-10-CM

## 2016-11-02 NOTE — Progress Notes (Signed)
Patient ID: Amber [name], female   DOB: 1955-12-02, 60 y.o.   MRN: [id number]    HISTORY AND PHYSICAL   DATE: 11/02/2016  Location:    [facility] Room Number: 237 A Place of Service: SNF (31)   Extended Emergency Contact Information Primary Emergency Contact: [place] Phone: [phone] Relation: Son Secondary Emergency Contact: Carroll Kinds [place] Phone: [phone] Relation: None  Advanced Directive information Does Patient Have a Medical Advance Directive?: No, Would patient like information on creating a medical advance directive?: No - Patient declined  Chief Complaint  Patient presents with  . New Admit To SNF    HPI:  60 yo female seen today as a new admission into SNF following hospital stay for embolic CVA, microcytic anemia, DM, pulmonary HTN, sz d/o, tachycardia and right arm chronic thrombus in short segment of cephalic vein, right glenoid impacted fx, chronic pain syndrome, asthma, anxiety. CT head revealed subacute ischemia posterior right frontal lobe. MRI brain showed multiple infarcts  ("numerous supra and infratentorial bilateral embolic infarcts, largest at right MCA frontal cortical region"). 2D echo revealed EF 60-65%. She underwent transcranial doppler (TCD) bubble study that was (+) for moderate R-->L intracardiac shunt. TEE on 10/30/16 (+) PFO at atrial septum. MRA of head and neck unremarkable. EEG showed diffuse cerebral dysfunction that is nonspecific in etiology. She had right shoulder pain since fall prior to admission. Xray right shoulder (+) impacted glenoid fx. No surgery recommended. Neurology/cardiology/Ortho consulted. She was started on lovenox/plavix/ASA. meds adjusted. LDL 33; A1c 4.7%; UDS (+) opiates and BZDs; hypercoagulable w/u neg except mildly (+) homocysteine; TSH 0.85; B12 level 549K; albumin 2.7-->2.3; Cr 1 --> Cr 0.89; Hgb 10.4-->8.9; WBC 12.9K; sed rate 25;  CRP -->0.9. She presents to SNF for short term rehab.  Today she reports back pain. No nursing concerns. No falls since admission. Appetite ok. Sleeps well.   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 She is currently on metformin xr 500 mg daily. A1c 4.7%   Hypertension - stable on norvasc 5 mg daily; toprol xl 25 mg daily.; lasix daily; Takes ASA '325mg'$  daily  Dyslipidemia - stable on lipitor 10 mg daily. LDL 33   Seizures - stable on vimpat 100 mg twice daily   GERD - stable on protonix 40 mg twice daily   Asthma - stable on advair 250/50 twice daily; chronic prednisone 15 mg daily; albuterol 2 puffs every 6 hours as needed  Chronic back pain/neuropathy - stable on Oxymorphone '20mg'$  q12h; gabapentin '300mg'$  BID  Anxiety - stable on diazepam BID  Protein calorie malnutrition - gets nutritional supplements per facility protocol; takes folate daily  Constipation - stable on miralax BID; linzess 150mg daily prn; lactulose 30 mls BID prn   Past Medical History:  Diagnosis Date  . Arthritis   . Asthma   . Chronic pain   . Constipation   . Diabetes mellitus without complication ([facility]   . Embolic stroke ([facility]   . Falls frequently   . GERD (gastroesophageal reflux disease)   . Hypertension   . Pneumonia 10/2015  . [place]ures [place]    last seizure March 2015    Past Surgical History:  Procedure Laterality Date  . APPENDECTOMY    . BACK SURGERY    . CHOLECYSTECTOMY    . TEE WITHOUT CARDIOVERSION N/A 10/30/2016   Procedure: TRANSESOPHAGEAL ECHOCARDIOGRAM (TEE);  Surgeon: KDorothy Spark MD;  [facility]  Service: Cardiovascular;  Laterality: N/A;  Patient Care Team: Sandi Mariscal, MD as PCP - General (Internal Medicine)  Social History   Social History  . Marital status: Single    Spouse name: N/A  . Number of children: N/A  . Years of education: N/A   Occupational History  . Not on file.   Social History Main Topics  . Smoking status: Former Smoker    Quit date:  2010  . Smokeless tobacco: Never Used  . Alcohol use No     Comment: admits to 2 drinks/week  . Drug use: No  . Sexual activity: No   Other Topics Concern  . Not on file   Social History Narrative  . No narrative on file     reports that she quit smoking about 7 years ago. She has never used smokeless tobacco. She reports that she does not drink alcohol or use drugs.  Family History  Problem Relation Age of Onset  . Kidney failure Mother   . Hypertension Sister   . Hypertension Brother    Family Status  Relation Status  . Mother   . Sister   . Brother     Immunization History  Administered Date(s) Administered  . Influenza,inj,Quad PF,36+ Mos 07/27/2015  . Pneumococcal Polysaccharide-23 07/27/2015    Allergies  Allergen Reactions  . Ivp Dye [Iodinated Diagnostic Agents] Itching    Medications: Patient's Medications  New Prescriptions   No medications on file  Previous Medications   ALBUTEROL (PROVENTIL HFA;VENTOLIN HFA) 108 (90 BASE) MCG/ACT INHALER    Inhale 2 puffs into the lungs every 6 (six) hours as needed for wheezing or shortness of breath.   AMLODIPINE (NORVASC) 5 MG TABLET    Take 5 mg by mouth daily.   ASPIRIN EC 325 MG EC TABLET    Take 1 tablet (325 mg total) by mouth daily.   ATORVASTATIN (LIPITOR) 10 MG TABLET    Take 10 mg by mouth daily.   DIAZEPAM (VALIUM) 5 MG TABLET    Take 0.5 tablets (2.5 mg total) by mouth 2 (two) times daily.   ENOXAPARIN (LOVENOX) 40 MG/0.4ML INJECTION    Inject 0.4 mLs (40 mg total) into the skin daily.   FEEDING SUPPLEMENT, ENSURE ENLIVE, (ENSURE ENLIVE) LIQD    Take 237 mLs by mouth 2 (two) times daily between meals.   FLUTICASONE-SALMETEROL (ADVAIR) 250-50 MCG/DOSE AEPB    Inhale 1 puff into the lungs 2 (two) times daily.   FOLIC ACID (FOLVITE) 1 MG TABLET    Take 1 mg by mouth daily.   GABAPENTIN (NEURONTIN) 300 MG CAPSULE    Take 1 capsule (300 mg total) by mouth 2 (two) times daily.   LACOSAMIDE 100 MG TABS    Take  1 tablet (100 mg total) by mouth 2 (two) times daily.   LACTULOSE (CHRONULAC) 10 GM/15ML SOLUTION    Take 30 mLs (20 g total) by mouth 2 (two) times daily as needed for severe constipation.   LINZESS 145 MCG CAPS CAPSULE    Take 1 capsule (145 mcg total) by mouth daily as needed (constipation).   METOPROLOL SUCCINATE (TOPROL-XL) 25 MG 24 HR TABLET    Take 25 mg by mouth daily.   OXYMORPHONE HCL, CRUSH RESIST, 20 MG PO T12A    Take 20 mg by mouth every 12 (twelve) hours.   PANTOPRAZOLE (PROTONIX) 40 MG TABLET    Take 1 tablet (40 mg total) by mouth 2 (two) times daily before a meal.   POLYETHYLENE GLYCOL (MIRALAX / GLYCOLAX)  PACKET    Take 17 g by mouth 2 (two) times daily.   PREDNISONE (DELTASONE) 10 MG TABLET    Take 15 mg by mouth daily with breakfast.   PROMETHAZINE (PHENERGAN) 25 MG TABLET    Take 1 tablet by mouth every 6 (six) hours as needed for nausea or vomiting. nausea   TORSEMIDE (DEMADEX) 10 MG TABLET    Take 10 mg by mouth daily.   VITAMIN D, ERGOCALCIFEROL, (DRISDOL) 50000 UNITS CAPS CAPSULE    Take 1 capsule (50,000 Units total) by mouth every 7 (seven) days. Sunday  Modified Medications   No medications on file  Discontinued Medications   METFORMIN (GLUCOPHAGE-XR) 500 MG 24 HR TABLET    Take 500 mg by mouth daily with breakfast.   POLYETHYLENE GLYCOL (MIRALAX / GLYCOLAX) PACKET    Take 17 g by mouth 2 (two) times daily.    Review of Systems  Musculoskeletal: Positive for arthralgias and back pain.  All other systems reviewed and are negative.   Vitals:   11/02/16 1443  BP: 109/68  Pulse: 96  Resp: 18  Temp: (!) 96.7 F (35.9 C)  TempSrc: Oral  SpO2: 98%  Weight: 114 lb 3.2 oz (51.8 kg)  Height: 5' (1.524 m)   Body mass index is 22.3 kg/m.  Physical Exam  Constitutional: She is oriented to person, place, and time. She appears well-developed.  Frail appearing, sitting up in bed asleep but easily awakened  HENT:  Mouth/Throat: No oropharyngeal exudate.  MM dry    Eyes: Pupils are equal, round, and reactive to light. No scleral icterus.  Neck: Neck supple. Carotid bruit is not present. No tracheal deviation present. No thyromegaly present.  Cardiovascular: Regular rhythm, normal heart sounds and intact distal pulses.  Tachycardia present.  Exam reveals no gallop and no friction rub.   No murmur heard. No LE edema b/l. no calf TTP.   Pulmonary/Chest: Effort normal and breath sounds normal. No stridor. No respiratory distress. She has no wheezes. She has no rales.  Abdominal: Soft. Bowel sounds are normal. She exhibits no distension and no mass. There is no hepatomegaly. There is no tenderness. There is no rebound and no guarding.  Musculoskeletal: She exhibits edema and tenderness.  RUE sling intact  Lymphadenopathy:    She has no cervical adenopathy.  Neurological: She is alert and oriented to person, place, and time.  Reduced right grip strength  Skin: Skin is warm and dry. No rash noted.  Psychiatric: She has a normal mood and affect. Her behavior is normal. Thought content normal.     Labs reviewed: Admission on 10/25/2016, Discharged on 10/31/2016  No results displayed because visit has over 200 results.  Lab Results  Component Value Date   HGBA1C 4.7 (L) 10/27/2016     Admission on 08/22/2016, Discharged on 08/23/2016  Component Date Value Ref Range Status  . Sodium 08/22/2016 138  135 - 145 mmol/L Final  . Potassium 08/22/2016 2.8* 3.5 - 5.1 mmol/L Final  . Chloride 08/22/2016 104  101 - 111 mmol/L Final  . CO2 08/22/2016 23  22 - 32 mmol/L Final  . Glucose, Bld 08/22/2016 114* 65 - 99 mg/dL Final  . BUN 08/22/2016 13  6 - 20 mg/dL Final  . Creatinine, Ser 08/22/2016 1.21* 0.44 - 1.00 mg/dL Final  . Calcium 08/22/2016 7.8* 8.9 - 10.3 mg/dL Final  . GFR calc non Af Amer 08/22/2016 48* >60 mL/min Final  . GFR calc Af Amer 08/22/2016 56* >60  mL/min Final   Comment: (NOTE) The eGFR has been calculated using the CKD EPI  equation. This calculation has not been validated in all clinical situations. eGFR's persistently <60 mL/min signify possible Chronic Kidney Disease.   . Anion gap 08/22/2016 11  5 - 15 Final  . WBC 08/22/2016 14.2* 4.0 - 10.5 K/uL Final  . RBC 08/22/2016 4.15  3.87 - 5.11 MIL/uL Final  . Hemoglobin 08/22/2016 10.1* 12.0 - 15.0 g/dL Final  . HCT 08/22/2016 31.9* 36.0 - 46.0 % Final  . MCV 08/22/2016 76.9* 78.0 - 100.0 fL Final  . MCH 08/22/2016 24.3* 26.0 - 34.0 pg Final  . MCHC 08/22/2016 31.7  30.0 - 36.0 g/dL Final  . RDW 08/22/2016 16.1* 11.5 - 15.5 % Final  . Platelets 08/22/2016 298  150 - 400 K/uL Final  . Troponin i, poc 08/22/2016 0.00  0.00 - 0.08 ng/mL Final  . Comment 3 08/22/2016          Final   Comment: Due to the release kinetics of cTnI, a negative result within the first hours of the onset of symptoms does not rule out myocardial infarction with certainty. If myocardial infarction is still suspected, repeat the test at appropriate intervals.   . Glucose-Capillary 08/22/2016 111* 65 - 99 mg/dL Final  . Lipase 08/22/2016 23  11 - 51 U/L Final  . Color, Urine 08/22/2016 YELLOW  YELLOW Final  . APPearance 08/22/2016 CLEAR  CLEAR Final  . Specific Gravity, Urine 08/22/2016 1.012  1.005 - 1.030 Final  . pH 08/22/2016 6.5  5.0 - 8.0 Final  . Glucose, UA 08/22/2016 NEGATIVE  NEGATIVE mg/dL Final  . Hgb urine dipstick 08/22/2016 NEGATIVE  NEGATIVE Final  . Bilirubin Urine 08/22/2016 NEGATIVE  NEGATIVE Final  . Ketones, ur 08/22/2016 NEGATIVE  NEGATIVE mg/dL Final  . Protein, ur 08/22/2016 NEGATIVE  NEGATIVE mg/dL Final  . Nitrite 08/22/2016 NEGATIVE  NEGATIVE Final  . Leukocytes, UA 08/22/2016 NEGATIVE  NEGATIVE Final    Dg Chest 2 View  Result Date: 10/27/2016 CLINICAL DATA:  Stroke 2 days ago EXAM: CHEST  2 VIEW COMPARISON:  08/22/2016 FINDINGS: Cardiomegaly again noted. No infiltrate or pleural effusion. No pulmonary edema. Stable left basilar scarring. Metallic  fixation rods thoracic spine again noted. Degenerative changes bilateral shoulders. IMPRESSION: No active cardiopulmonary disease. Cardiomegaly again noted. Stable left basilar scarring. Electronically Signed   By: Lahoma Crocker M.D.   On: 10/27/2016 16:44   Dg Shoulder Right  Result Date: 10/26/2016 CLINICAL DATA:  60 y/o F; status post fall with right shoulder pain. EXAM: RIGHT SHOULDER - 2+ VIEW COMPARISON:  05/19/2016 right shoulder radiographs. FINDINGS: The humeral head is seated on the glenoid. There is deformity of the glenoid rim most pronounced superiorly probably representing impacted fracture and there are small calcific densities surrounding the proximal humerus which may represent intra-articular bodies. Clavicle is obscured by the mandible. Chronic right upper posterior lateral rib fractures. IMPRESSION: Deformity of glenoid rim most pronounced superiorly may represent an impacted fracture. No dislocation. Ossific densities surrounding proximal humerus may represent intra-articular bodies. Electronically Signed   By: Kristine Garbe M.D.   On: 10/26/2016 15:11   Ct Head Wo Contrast  Result Date: 10/26/2016 CLINICAL DATA:  Fall 2 weeks ago. Fall backward after walker malfunction. Focal seizure. EXAM: CT HEAD WITHOUT CONTRAST TECHNIQUE: Contiguous axial images were obtained from the base of the skull through the vertex without intravenous contrast. COMPARISON:  Head CT 02/28/2016 FINDINGS: Brain: Focal cortical hypodensity in the  posterior right frontal lobe concerning for subacute ischemia. No intracranial hemorrhage. Background chronic small vessel ischemia is similar. No subdural or extra-axial fluid collection. No hydrocephalus. Vascular: Atherosclerosis of skullbase vasculature without hyperdense vessel or abnormal calcification. Skull: No acute fracture. Stable sclerotic focus in the right frontal bone. Sinuses/Orbits: Paranasal sinuses and mastoid air cells are clear. The visualized  orbits are unremarkable. Other: None. IMPRESSION: Findings concerning for subacute ischemia in the posterior right frontal lobe. These results were called by telephone at the time of interpretation on 10/26/2016 at 1:34 am to Dr. Delora Fuel , who verbally acknowledged these results. Electronically Signed   By: Jeb Levering M.D.   On: 10/26/2016 01:34   Mr Jodene Nam Head Wo Contrast  Result Date: 10/26/2016 CLINICAL DATA:  Suspected emboli. Multiple acute strokes. Seizure activity. EXAM: MRA NECK WITHOUT AND WITH CONTRAST MRA HEAD WITHOUT CONTRAST TECHNIQUE: Multiplanar and multiecho pulse sequences of the neck were obtained without and with intravenous contrast. Angiographic images of the neck were obtained using MRA technique without and with intravenous contrast.; Angiographic images of the Circle of Willis were obtained using MRA technique without intravenous contrast. CONTRAST:  72m MULTIHANCE GADOBENATE DIMEGLUMINE 529 MG/ML IV SOLN COMPARISON:  MR brain earlier today. FINDINGS: MRA NECK FINDINGS Marked dolichoectasia that suggesting longstanding hypertension. No visible ascending aortic dissection or proximal great vessel stenosis. Carotid bifurcations free of disease. No cervical ICA dissection or fibromuscular dysplasia. RIGHT vertebral is the sole contributor to the basilar. No flow-limiting stenosis through the neck or proximal intracranial segments. LEFT vertebral is poorly seen at its origin and there may be a significant ostial stenosis. In the neck the LEFT vertebral ascends in a fairly normal caliber, not as large as the RIGHT. Intracranially, it terminates in PICA. MRA HEAD FINDINGS Minor non stenotic atheromatous change in the cavernous and supraclinoid ICA segments bilaterally. Minor irregularity the anterior and middle cerebral arteries without flow-limiting proximal stenosis. No MCA branch occlusion. Basilar artery hypoplastic. This is in part due to BILATERAL fetal PCA anatomy. The RIGHT  vertebral is the sole contributor to the basilar. LEFT vertebral terminates in PICA. Both PICA ease, and superior cerebellar arteries are patent. Anterior inferior cerebral artery is poorly visualized. Distal MCA and PCA branches are mildly irregular consistent with intracranial atherosclerotic change. No visible saccular aneurysm. IMPRESSION: No proximal intracranial stenosis or dissection. No extracranial carotid bifurcation, proximal ICA, or visible great vessel stenosis. Dominant RIGHT vertebral is the sole contributor to the basilar. LEFT vertebral terminates in PICA. Given the relatively unimpressive appearance of the extracranial and intracranial circulation, a cardiac source of emboli is suspected. No large vessel vasculitic change. Mild irregularity of the distal MCA and PCA branches more suggestive of intracranial atherosclerotic change Electronically Signed   By: JStaci RighterM.D.   On: 10/26/2016 16:44   Mr MJodene NamNeck W Wo Contrast  Result Date: 10/26/2016 CLINICAL DATA:  Suspected emboli. Multiple acute strokes. Seizure activity. EXAM: MRA NECK WITHOUT AND WITH CONTRAST MRA HEAD WITHOUT CONTRAST TECHNIQUE: Multiplanar and multiecho pulse sequences of the neck were obtained without and with intravenous contrast. Angiographic images of the neck were obtained using MRA technique without and with intravenous contrast.; Angiographic images of the Circle of Willis were obtained using MRA technique without intravenous contrast. CONTRAST:  11mMULTIHANCE GADOBENATE DIMEGLUMINE 529 MG/ML IV SOLN COMPARISON:  MR brain earlier today. FINDINGS: MRA NECK FINDINGS Marked dolichoectasia that suggesting longstanding hypertension. No visible ascending aortic dissection or proximal great vessel stenosis. Carotid bifurcations free of  disease. No cervical ICA dissection or fibromuscular dysplasia. RIGHT vertebral is the sole contributor to the basilar. No flow-limiting stenosis through the neck or proximal intracranial  segments. LEFT vertebral is poorly seen at its origin and there may be a significant ostial stenosis. In the neck the LEFT vertebral ascends in a fairly normal caliber, not as large as the RIGHT. Intracranially, it terminates in PICA. MRA HEAD FINDINGS Minor non stenotic atheromatous change in the cavernous and supraclinoid ICA segments bilaterally. Minor irregularity the anterior and middle cerebral arteries without flow-limiting proximal stenosis. No MCA branch occlusion. Basilar artery hypoplastic. This is in part due to BILATERAL fetal PCA anatomy. The RIGHT vertebral is the sole contributor to the basilar. LEFT vertebral terminates in PICA. Both PICA ease, and superior cerebellar arteries are patent. Anterior inferior cerebral artery is poorly visualized. Distal MCA and PCA branches are mildly irregular consistent with intracranial atherosclerotic change. No visible saccular aneurysm. IMPRESSION: No proximal intracranial stenosis or dissection. No extracranial carotid bifurcation, proximal ICA, or visible great vessel stenosis. Dominant RIGHT vertebral is the sole contributor to the basilar. LEFT vertebral terminates in PICA. Given the relatively unimpressive appearance of the extracranial and intracranial circulation, a cardiac source of emboli is suspected. No large vessel vasculitic change. Mild irregularity of the distal MCA and PCA branches more suggestive of intracranial atherosclerotic change Electronically Signed   By: Staci Righter M.D.   On: 10/26/2016 16:44   Mr Brain Wo Contrast  Result Date: 10/26/2016 CLINICAL DATA:  Tremor, fell on Thanksgiving with head injury. Headache and dizziness. History of diabetes, seizures, hypertension. EXAM: MRI HEAD WITHOUT CONTRAST TECHNIQUE: Multiplanar, multiecho pulse sequences of the brain and surrounding structures were obtained without intravenous contrast. COMPARISON:  CT HEAD October 26, 2016 at 0101 hours FINDINGS: BRAIN: Confluent up to 4.2 cm reduced  diffusion RIGHT posterior frontal parietal lobe, about the central sulcus. Corresponding low ADC values and faint T2 hyperintense signal. Numerous sub cm foci of reduced diffusion bilateral frontal, parietal and bilateral occipital lobes. Symmetric reduced diffusion mesial thalamus suggests artery of [place]. Multiple foci of reduced diffusion measure up to 11 mm in the cerebellum with low ADC values. Ventricles and sulci are normal for patient's age. Patchy to confluent supratentorial white matter FLAIR T2 hyperintensities exclusive of the aforementioned abnormalities. No midline shift, mass effect or masses. No abnormal extra-axial fluid collections. VASCULAR: Normal major intracranial vascular flow voids present at skull base. SKULL AND UPPER CERVICAL SPINE: No abnormal sellar expansion. No suspicious calvarial bone marrow signal. Craniocervical junction maintained. Grade 1 C3-4 and C4-5 anterolisthesis. SINUSES/ORBITS: The mastoid air-cells and included paranasal sinuses are well-aerated. The included ocular globes and orbital contents are non-suspicious. OTHER: Patient is edentulous. 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 Findings consistent with central embolic phenomena. Mild to moderate chronic small vessel ischemic disease, advanced for age. Electronically Signed   By: Elon Alas M.D.   On: 10/26/2016 05:19   Nm Pulmonary Perf And Vent  Result Date: 10/27/2016 CLINICAL DATA:  Shortness of breath. EXAM: NUCLEAR MEDICINE VENTILATION - PERFUSION LUNG SCAN TECHNIQUE: Ventilation images were obtained in multiple projections using inhaled aerosol Tc-33mDTPA. Perfusion images were obtained in multiple projections after intravenous injection of Tc-968mAA. RADIOPHARMACEUTICALS:  31.6 mCi Technetium-9962mPA aerosol inhalation and 4.38 mCi Technetium-74m50m IV  COMPARISON:  Chest x-ray today and chest CT 05/04/2016 FINDINGS: Ventilation: No focal ventilation defect. Perfusion: No wedge shaped  peripheral perfusion defects to suggest acute pulmonary embolism. IMPRESSION: Normal VQ scan without evidence of pulmonary embolism. Electronically Signed   By: Marin Olp M.D.   On: 10/27/2016 16:59     Assessment/Plan   ICD-9-CM ICD-10-CM   1. History of CVA with residual deficit 438.9 I69.30    multiple EMBOLIC infarcts  2. PFO (patent foramen ovale) 745.5 Q21.1   3. Impacted fracture 829.0 T14.8XXA    right glenoid  4. Essential hypertension 401.9 I10   5. Microcytic anemia 280.9 D50.9   6. Chronic low back pain with sciatica, sciatica laterality unspecified, unspecified back pain laterality 724.2 M54.40    724.3 G89.29    338.29    7. Seizure ([place]) 780.39 R56.9   8. Mild intermittent asthma without complication 771.16 F79.03   9. Drug-induced hyperglycemia 790.29 R73.9    E947.9 T50.905A    due to prednisone  10. Drug-induced constipation 564.09 K59.03    E980.5      CBC and CMP pending  f/u with Dr Leonie Man (neurology) and Dr Copper (cardiothoracic sx for PFO closure)  D/c metformin due to well controlled BS. Check fasting CBG daily  Cont other meds as ordered  PT/OT/ST as ordered  Nutritional supplements as indicated  GOAL: short term rehab and d/c home when medically appropriate. Communicated with pt and nursing.  Will follow  Kelan Pritt S. Perlie Gold  [facility] [address] [place], [place] [address] [phone] Cell (Monday-Friday 8 AM - 5 PM) [phone] After 5 PM and follow prompts

## 2016-11-03 ENCOUNTER — Non-Acute Institutional Stay (SKILLED_NURSING_FACILITY): Payer: Medicaid Other | Admitting: Adult Health

## 2016-11-03 ENCOUNTER — Encounter: Payer: Self-pay | Admitting: Adult Health

## 2016-11-03 DIAGNOSIS — N3000 Acute cystitis without hematuria: Secondary | ICD-10-CM

## 2016-11-03 NOTE — Progress Notes (Signed)
Patient ID: Amber [name], female   DOB: [birth year], 58 y.o.   MRN: [id number]   Location:   [facility] Room Number: 131-A Place of Service:  SNF (31)   CODE STATUS: Full Code  Allergies  Allergen Reactions  . Ivp Dye [Iodinated Diagnostic Agents] Itching    Chief Complaint  Patient presents with  . Acute Visit    UTI    HPI:  She is complaining of being unable to void and has suprapubic pain. She states that she is hurting all the time. There are no reports of fever present. She does eat and drink well.    Past Medical History:  Diagnosis Date  . Arthritis   . Asthma   . Chronic pain   . Constipation   . Diabetes mellitus without complication (HCC)   . Embolic stroke (HCC)   . Falls frequently   . GERD (gastroesophageal reflux disease)   . Hypertension   . Pneumonia 10/2015  . Seizures (HCC)    last seizure March 2015    Past Surgical History:  Procedure Laterality Date  . APPENDECTOMY    . BACK SURGERY    . CHOLECYSTECTOMY    . TEE WITHOUT CARDIOVERSION N/A 10/30/2016   Procedure: TRANSESOPHAGEAL ECHOCARDIOGRAM (TEE);  Surgeon: Lars Masson, MD;  Location: [facility] ENDOSCOPY;  Service: Cardiovascular;  Laterality: N/A;    Social History   Social History  . Marital status: Single    Spouse name: N/A  . Number of children: N/A  . Years of education: N/A   Occupational History  . Not on file.   Social History Main Topics  . Smoking status: Former Smoker    Quit date: 2010  . Smokeless tobacco: Never Used  . Alcohol use No     Comment: admits to 2 drinks/week  . Drug use: No  . Sexual activity: No   Other Topics Concern  . Not on file   Social History Narrative  . No narrative on file   Family History  Problem Relation Age of Onset  . Kidney failure Mother   . Hypertension Sister   . Hypertension Brother       VITAL SIGNS BP 109/68   Pulse 96   Temp (!) 96.7 F (35.9 C) (Oral)   Resp 18   Ht 5' (1.524 m)   Wt 114 lb  (51.7 kg)   SpO2 98%   BMI 22.26 kg/m   Patient's Medications  New Prescriptions   No medications on file  Previous Medications   ALBUTEROL (PROVENTIL HFA;VENTOLIN HFA) 108 (90 BASE) MCG/ACT INHALER    Inhale 2 puffs into the lungs every 6 (six) hours as needed for wheezing or shortness of breath.   AMLODIPINE (NORVASC) 5 MG TABLET    Take 5 mg by mouth daily.   ASPIRIN EC 325 MG EC TABLET    Take 1 tablet (325 mg total) by mouth daily.   ATORVASTATIN (LIPITOR) 10 MG TABLET    Take 10 mg by mouth daily.   DIAZEPAM (VALIUM) 5 MG TABLET    Take 0.5 tablets (2.5 mg total) by mouth 2 (two) times daily.   ENOXAPARIN (LOVENOX) 40 MG/0.4ML INJECTION    Inject 0.4 mLs (40 mg total) into the skin daily.   FEEDING SUPPLEMENT, ENSURE ENLIVE, (ENSURE ENLIVE) LIQD    Take 237 mLs by mouth 2 (two) times daily between meals.   FLUTICASONE-SALMETEROL (ADVAIR) 250-50 MCG/DOSE AEPB    Inhale 1 puff into the lungs 2 (  two) times daily.   FOLIC ACID (FOLVITE) 1 MG TABLET    Take 1 mg by mouth daily.   GABAPENTIN (NEURONTIN) 300 MG CAPSULE    Take 1 capsule (300 mg total) by mouth 2 (two) times daily.   LACOSAMIDE 100 MG TABS    Take 1 tablet (100 mg total) by mouth 2 (two) times daily.   LACTULOSE (CHRONULAC) 10 GM/15ML SOLUTION    Take 30 mLs (20 g total) by mouth 2 (two) times daily as needed for severe constipation.   LINZESS 145 MCG CAPS CAPSULE    Take 1 capsule (145 mcg total) by mouth daily as needed (constipation).   METOPROLOL SUCCINATE (TOPROL-XL) 25 MG 24 HR TABLET    Take 25 mg by mouth daily.   OXYMORPHONE HCL, CRUSH RESIST, 20 MG PO T12A    Take 20 mg by mouth every 12 (twelve) hours.   PANTOPRAZOLE (PROTONIX) 40 MG TABLET    Take 1 tablet (40 mg total) by mouth 2 (two) times daily before a meal.   POLYETHYLENE GLYCOL (MIRALAX / GLYCOLAX) PACKET    Take 17 g by mouth 2 (two) times daily.   PREDNISONE (DELTASONE) 10 MG TABLET    Take 15 mg by mouth daily with breakfast.   PROMETHAZINE (PHENERGAN)  25 MG TABLET    Take 1 tablet by mouth every 6 (six) hours as needed for nausea or vomiting. nausea   TORSEMIDE (DEMADEX) 10 MG TABLET    Take 10 mg by mouth daily.   VITAMIN D, ERGOCALCIFEROL, (DRISDOL) 50000 UNITS CAPS CAPSULE    Take 1 capsule (50,000 Units total) by mouth every 7 (seven) days. Sunday  Modified Medications   No medications on file  Discontinued Medications   No medications on file     SIGNIFICANT DIAGNOSTIC EXAMS  10-26-16: ct of head: Findings concerning for subacute ischemia in the posterior right frontal lobe  10-26-16: mri of brain: Numerous supra and infratentorial acute infarcts spanning multiple vascular territories, largest in RIGHT frontoparietal lobes corresponding to CT abnormality. Findings consistent with central embolic phenomena. Mild to moderate chronic small vessel ischemic disease, advanced for Age.  10-26-16: right shoulder x-ray: Deformity of glenoid rim most pronounced superiorly may represent an impacted fracture. No dislocation. Ossific densities surrounding proximal humerus may represent intra-articular bodies.   10-27-16: mra of neck and head: No proximal intracranial stenosis or dissection. No extracranial carotid bifurcation, proximal ICA, or visible great vessel stenosis. Dominant RIGHT vertebral is the sole contributor to the basilar. LEFT vertebral terminates in PICA. Given the relatively unimpressive appearance of the extracranial and intracranial circulation, a cardiac source of emboli is suspected. No large vessel vasculitic change. Mild irregularity of the distal MCA and PCA branches more suggestive of intracranial atherosclerotic Change  10-27-16; chest x-ray; No active cardiopulmonary disease. Cardiomegaly again noted. Stable left basilar scarring.  10-27-16: VQ scan: Normal VQ scan without evidence of pulmonary embolism.   10-30-16: TEE: - Left ventricle: Systolic function was normal. Wall motion was normal; there were no regional wall  motion abnormalities. - Aortic valve: Structurally normal valve. Trileaflet; normal thickness leaflets. There was no significant regurgitation. - Mitral valve: Structurally normal valve. There was trivial regurgitation. - Left atrium: No evidence of thrombus in the atrial cavity or appendage. - Right ventricle: The cavity size was normal. Wall thickness was normal. Systolic function was normal. - Right atrium: No evidence of thrombus in the atrial cavity or appendage. - Atrial septum: There was a patent foramen ovale. -  Tricuspid valve: There was mild regurgitation. Impressions: - Positive bubble study. Present PFO.    LABS REVIEWED:   10-26-16: wbc 9.8; hgb 31.6; mcv 77.8 ;plt 203; glucose 87; bun 13; creat 1.00;  k+ 3.4; na++ 142; liver normal albumin 2.7 10-27-16; wbc 12.1; hgb 9.9; hct 29.7; mcv 76.2; plt 233; glucose 89; bun 9; creat 0.89; k+ 4.1; na++ 139; liver normal albumin 2.2 hgb a1c 4.2; chol 105; lld 33; trig 55 hdl 621; tsh 0.852; vit B 12: 549; sed rate 25; CRP 0.9; HIV; nr; RPR; nr 12-10-176: wbc 11.8; hgb 9.3; hct 28.1; mcv 76.8; plt 220; glucose 95; bun 12; creat 0.94; k+ 4.5; na++ 140; liver normal albumin 2.3  10-31-16: wbc 12.9; hgb 8.9; hct 27.4; mcv 78.1; plt 252    Review of Systems  Constitutional: Negative for malaise/fatigue.  Respiratory: Negative for cough and shortness of breath.   Cardiovascular: Negative for chest pain, palpitations and leg swelling.  Gastrointestinal: Negative for abdominal pain, constipation and heartburn.  Musculoskeletal: Positive for back pain. Negative for joint pain and myalgias.       Has chronic back pain Is unable to void and has is having pain    Skin: Negative.   Neurological: Negative for dizziness.  Psychiatric/Behavioral: The patient is not nervous/anxious.     Physical Exam  Constitutional: No distress.  Eyes: Conjunctivae are normal.  Neck: Neck supple. No JVD present. No thyromegaly present.  Cardiovascular: Normal  rate, regular rhythm and intact distal pulses.   Respiratory: Effort normal and breath sounds normal. No respiratory distress. She has no wheezes.  GI: Soft. Bowel sounds are normal. She exhibits no distension. There is no tenderness.  Musculoskeletal: She exhibits no edema.  Able to move all extremities Has left upper extremity weakness Is status post right shoulder fracture; Lymphadenopathy:    She has no cervical adenopathy.  Neurological: She is alert.  Skin: Skin is warm and dry. She is not diaphoretic.  Psychiatric: She has a normal mood and affect.     ASSESSMENT/ PLAN:  1. UTI: will I/O cath now and every 6 hours as needed. Will collect UA/C&S. Will begin pyridium 100 every 8 hours as needed and will treat as indicated.     Synthia Innocent NP [facility]  Contact [phone] Monday through Friday 8am- 5pm  After hours call [phone]

## 2016-11-23 ENCOUNTER — Emergency Department (HOSPITAL_BASED_OUTPATIENT_CLINIC_OR_DEPARTMENT_OTHER): Payer: Medicaid Other

## 2016-11-23 ENCOUNTER — Encounter (HOSPITAL_BASED_OUTPATIENT_CLINIC_OR_DEPARTMENT_OTHER): Payer: Self-pay | Admitting: *Deleted

## 2016-11-23 ENCOUNTER — Inpatient Hospital Stay (HOSPITAL_BASED_OUTPATIENT_CLINIC_OR_DEPARTMENT_OTHER)
Admission: EM | Admit: 2016-11-23 | Discharge: 2016-11-27 | DRG: 313 | Disposition: A | Discharge status: Home Health | Payer: Medicaid Other | Attending: Internal Medicine | Admitting: Internal Medicine

## 2016-11-23 DIAGNOSIS — Z7951 Long term (current) use of inhaled steroids: Secondary | ICD-10-CM

## 2016-11-23 DIAGNOSIS — Z8673 Personal history of transient ischemic attack (TIA), and cerebral infarction without residual deficits: Secondary | ICD-10-CM

## 2016-11-23 DIAGNOSIS — Z87898 Personal history of other specified conditions: Secondary | ICD-10-CM

## 2016-11-23 DIAGNOSIS — R079 Chest pain, unspecified: Secondary | ICD-10-CM | POA: Diagnosis not present

## 2016-11-23 DIAGNOSIS — R748 Abnormal levels of other serum enzymes: Secondary | ICD-10-CM | POA: Diagnosis present

## 2016-11-23 DIAGNOSIS — Z91041 Radiographic dye allergy status: Secondary | ICD-10-CM

## 2016-11-23 DIAGNOSIS — W19XXXA Unspecified fall, initial encounter: Secondary | ICD-10-CM | POA: Diagnosis present

## 2016-11-23 DIAGNOSIS — R778 Other specified abnormalities of plasma proteins: Secondary | ICD-10-CM

## 2016-11-23 DIAGNOSIS — I110 Hypertensive heart disease with heart failure: Secondary | ICD-10-CM | POA: Diagnosis present

## 2016-11-23 DIAGNOSIS — R0602 Shortness of breath: Secondary | ICD-10-CM

## 2016-11-23 DIAGNOSIS — I5032 Chronic diastolic (congestive) heart failure: Secondary | ICD-10-CM | POA: Diagnosis present

## 2016-11-23 DIAGNOSIS — Z7982 Long term (current) use of aspirin: Secondary | ICD-10-CM

## 2016-11-23 DIAGNOSIS — J45909 Unspecified asthma, uncomplicated: Secondary | ICD-10-CM | POA: Diagnosis present

## 2016-11-23 DIAGNOSIS — Z7984 Long term (current) use of oral hypoglycemic drugs: Secondary | ICD-10-CM

## 2016-11-23 DIAGNOSIS — Z79899 Other long term (current) drug therapy: Secondary | ICD-10-CM

## 2016-11-23 DIAGNOSIS — E119 Type 2 diabetes mellitus without complications: Secondary | ICD-10-CM | POA: Diagnosis present

## 2016-11-23 DIAGNOSIS — I6300 Cerebral infarction due to thrombosis of unspecified precerebral artery: Secondary | ICD-10-CM

## 2016-11-23 DIAGNOSIS — J4520 Mild intermittent asthma, uncomplicated: Secondary | ICD-10-CM

## 2016-11-23 DIAGNOSIS — I119 Hypertensive heart disease without heart failure: Secondary | ICD-10-CM | POA: Diagnosis present

## 2016-11-23 DIAGNOSIS — R509 Fever, unspecified: Secondary | ICD-10-CM

## 2016-11-23 DIAGNOSIS — Z8249 Family history of ischemic heart disease and other diseases of the circulatory system: Secondary | ICD-10-CM

## 2016-11-23 DIAGNOSIS — Z7901 Long term (current) use of anticoagulants: Secondary | ICD-10-CM

## 2016-11-23 DIAGNOSIS — R000 Tachycardia, unspecified: Secondary | ICD-10-CM | POA: Diagnosis present

## 2016-11-23 DIAGNOSIS — M25511 Pain in right shoulder: Secondary | ICD-10-CM | POA: Diagnosis present

## 2016-11-23 DIAGNOSIS — I200 Unstable angina: Secondary | ICD-10-CM

## 2016-11-23 DIAGNOSIS — Z87891 Personal history of nicotine dependence: Secondary | ICD-10-CM

## 2016-11-23 DIAGNOSIS — K219 Gastro-esophageal reflux disease without esophagitis: Secondary | ICD-10-CM | POA: Diagnosis present

## 2016-11-23 DIAGNOSIS — M5440 Lumbago with sciatica, unspecified side: Secondary | ICD-10-CM | POA: Diagnosis not present

## 2016-11-23 DIAGNOSIS — Q211 Atrial septal defect: Secondary | ICD-10-CM

## 2016-11-23 DIAGNOSIS — R4182 Altered mental status, unspecified: Secondary | ICD-10-CM

## 2016-11-23 DIAGNOSIS — R400 Somnolence: Secondary | ICD-10-CM | POA: Diagnosis not present

## 2016-11-23 DIAGNOSIS — I208 Other forms of angina pectoris: Secondary | ICD-10-CM | POA: Diagnosis not present

## 2016-11-23 DIAGNOSIS — J690 Pneumonitis due to inhalation of food and vomit: Secondary | ICD-10-CM | POA: Diagnosis not present

## 2016-11-23 DIAGNOSIS — E876 Hypokalemia: Secondary | ICD-10-CM | POA: Diagnosis present

## 2016-11-23 DIAGNOSIS — F13239 Sedative, hypnotic or anxiolytic dependence with withdrawal, unspecified: Secondary | ICD-10-CM | POA: Diagnosis present

## 2016-11-23 DIAGNOSIS — J9811 Atelectasis: Secondary | ICD-10-CM | POA: Diagnosis present

## 2016-11-23 DIAGNOSIS — Z79891 Long term (current) use of opiate analgesic: Secondary | ICD-10-CM

## 2016-11-23 DIAGNOSIS — R791 Abnormal coagulation profile: Secondary | ICD-10-CM | POA: Diagnosis present

## 2016-11-23 DIAGNOSIS — Z86718 Personal history of other venous thrombosis and embolism: Secondary | ICD-10-CM

## 2016-11-23 DIAGNOSIS — Z7952 Long term (current) use of systemic steroids: Secondary | ICD-10-CM

## 2016-11-23 DIAGNOSIS — R296 Repeated falls: Secondary | ICD-10-CM | POA: Diagnosis present

## 2016-11-23 DIAGNOSIS — I4891 Unspecified atrial fibrillation: Secondary | ICD-10-CM | POA: Diagnosis present

## 2016-11-23 DIAGNOSIS — G40909 Epilepsy, unspecified, not intractable, without status epilepticus: Secondary | ICD-10-CM | POA: Diagnosis present

## 2016-11-23 DIAGNOSIS — G9340 Encephalopathy, unspecified: Secondary | ICD-10-CM | POA: Diagnosis not present

## 2016-11-23 DIAGNOSIS — I272 Pulmonary hypertension, unspecified: Secondary | ICD-10-CM | POA: Diagnosis present

## 2016-11-23 DIAGNOSIS — F419 Anxiety disorder, unspecified: Secondary | ICD-10-CM | POA: Diagnosis present

## 2016-11-23 DIAGNOSIS — S42141A Displaced fracture of glenoid cavity of scapula, right shoulder, initial encounter for closed fracture: Secondary | ICD-10-CM | POA: Diagnosis present

## 2016-11-23 DIAGNOSIS — M549 Dorsalgia, unspecified: Secondary | ICD-10-CM | POA: Diagnosis present

## 2016-11-23 DIAGNOSIS — R262 Difficulty in walking, not elsewhere classified: Secondary | ICD-10-CM

## 2016-11-23 DIAGNOSIS — R7989 Other specified abnormal findings of blood chemistry: Secondary | ICD-10-CM

## 2016-11-23 DIAGNOSIS — G8929 Other chronic pain: Secondary | ICD-10-CM | POA: Diagnosis present

## 2016-11-23 DIAGNOSIS — I639 Cerebral infarction, unspecified: Secondary | ICD-10-CM | POA: Diagnosis present

## 2016-11-23 DIAGNOSIS — R0789 Other chest pain: Principal | ICD-10-CM | POA: Diagnosis present

## 2016-11-23 LAB — CBC
HEMATOCRIT: 27.9 % — AB (ref 36.0–46.0)
Hemoglobin: 9.1 g/dL — ABNORMAL LOW (ref 12.0–15.0)
MCH: 25.2 pg — ABNORMAL LOW (ref 26.0–34.0)
MCHC: 32.6 g/dL (ref 30.0–36.0)
MCV: 77.3 fL — AB (ref 78.0–100.0)
PLATELETS: 190 10*3/uL (ref 150–400)
RBC: 3.61 MIL/uL — ABNORMAL LOW (ref 3.87–5.11)
RDW: 14.9 % (ref 11.5–15.5)
WBC: 9.3 10*3/uL (ref 4.0–10.5)

## 2016-11-23 LAB — GLUCOSE, CAPILLARY
GLUCOSE-CAPILLARY: 126 mg/dL — AB (ref 65–99)
Glucose-Capillary: 184 mg/dL — ABNORMAL HIGH (ref 65–99)

## 2016-11-23 LAB — TROPONIN I
TROPONIN I: 0.07 ng/mL — AB (ref <0.03)
TROPONIN I: 0.06 ng/mL — AB (ref <0.03)
Troponin I: 0.07 ng/mL (ref <0.03)
Troponin I: 0.07 ng/mL (ref <0.03)

## 2016-11-23 LAB — CBC WITH DIFFERENTIAL/PLATELET
BASOS ABS: 0 10*3/uL (ref 0.0–0.1)
Basophils Relative: 0 %
EOS PCT: 0 %
Eosinophils Absolute: 0 10*3/uL (ref 0.0–0.7)
HEMATOCRIT: 30.3 % — AB (ref 36.0–46.0)
Hemoglobin: 10 g/dL — ABNORMAL LOW (ref 12.0–15.0)
LYMPHS PCT: 33 %
Lymphs Abs: 2.9 10*3/uL (ref 0.7–4.0)
MCH: 25.4 pg — ABNORMAL LOW (ref 26.0–34.0)
MCHC: 33 g/dL (ref 30.0–36.0)
MCV: 76.9 fL — AB (ref 78.0–100.0)
Monocytes Absolute: 1 10*3/uL (ref 0.1–1.0)
Monocytes Relative: 11 %
NEUTROS ABS: 5 10*3/uL (ref 1.7–7.7)
Neutrophils Relative %: 56 %
PLATELETS: 205 10*3/uL (ref 150–400)
RBC: 3.94 MIL/uL (ref 3.87–5.11)
RDW: 15 % (ref 11.5–15.5)
WBC: 8.9 10*3/uL (ref 4.0–10.5)

## 2016-11-23 LAB — BASIC METABOLIC PANEL
ANION GAP: 9 (ref 5–15)
BUN: 16 mg/dL (ref 6–20)
CO2: 21 mmol/L — ABNORMAL LOW (ref 22–32)
Calcium: 8.5 mg/dL — ABNORMAL LOW (ref 8.9–10.3)
Chloride: 114 mmol/L — ABNORMAL HIGH (ref 101–111)
Creatinine, Ser: 0.89 mg/dL (ref 0.44–1.00)
GFR calc Af Amer: >60 mL/min (ref >60)
Glucose, Bld: 118 mg/dL — ABNORMAL HIGH (ref 65–99)
POTASSIUM: 2.9 mmol/L — AB (ref 3.5–5.1)
SODIUM: 144 mmol/L (ref 135–145)

## 2016-11-23 LAB — CREATININE, SERUM
Creatinine, Ser: 0.84 mg/dL (ref 0.44–1.00)
GFR calc Af Amer: >60 mL/min (ref >60)
GFR calc non Af Amer: >60 mL/min (ref >60)

## 2016-11-23 LAB — MAGNESIUM: MAGNESIUM: 1.2 mg/dL — AB (ref 1.7–2.4)

## 2016-11-23 LAB — D-DIMER, QUANTITATIVE (NOT AT ARMC): D DIMER QUANT: 2.59 ug{FEU}/mL — AB (ref 0.00–0.50)

## 2016-11-23 MED ORDER — PANTOPRAZOLE SODIUM 40 MG PO TBEC
40.0000 mg | DELAYED_RELEASE_TABLET | Freq: Two times a day (BID) | ORAL | Status: DC
  Administered 2016-11-24 – 2016-11-27 (×6): 40 mg via ORAL
  Filled 2016-11-23 (×6): qty 1

## 2016-11-23 MED ORDER — POTASSIUM CHLORIDE CRYS ER 20 MEQ PO TBCR
40.0000 meq | EXTENDED_RELEASE_TABLET | Freq: Once | ORAL | Status: AC
  Administered 2016-11-23: 40 meq via ORAL
  Filled 2016-11-23: qty 2

## 2016-11-23 MED ORDER — ATORVASTATIN CALCIUM 10 MG PO TABS
10.0000 mg | ORAL_TABLET | Freq: Every day | ORAL | Status: DC
  Administered 2016-11-23 – 2016-11-27 (×4): 10 mg via ORAL
  Filled 2016-11-23 (×4): qty 1

## 2016-11-23 MED ORDER — TORSEMIDE 20 MG PO TABS
10.0000 mg | ORAL_TABLET | Freq: Every day | ORAL | Status: DC
  Administered 2016-11-23 – 2016-11-24 (×2): 10 mg via ORAL
  Filled 2016-11-23 (×2): qty 1

## 2016-11-23 MED ORDER — OXYCODONE HCL 5 MG PO TABS
10.0000 mg | ORAL_TABLET | Freq: Once | ORAL | Status: AC | PRN
  Administered 2016-11-23: 10 mg via ORAL
  Filled 2016-11-23: qty 2

## 2016-11-23 MED ORDER — ENOXAPARIN SODIUM 40 MG/0.4ML ~~LOC~~ SOLN
40.0000 mg | SUBCUTANEOUS | Status: DC
Start: 2016-11-23 — End: 2016-11-25
  Administered 2016-11-23 – 2016-11-24 (×2): 40 mg via SUBCUTANEOUS
  Filled 2016-11-23 (×2): qty 0.4

## 2016-11-23 MED ORDER — ASPIRIN EC 325 MG PO TBEC
325.0000 mg | DELAYED_RELEASE_TABLET | Freq: Every day | ORAL | Status: DC
  Administered 2016-11-24 – 2016-11-27 (×3): 325 mg via ORAL
  Filled 2016-11-23 (×4): qty 1

## 2016-11-23 MED ORDER — METOPROLOL SUCCINATE ER 25 MG PO TB24
25.0000 mg | ORAL_TABLET | Freq: Every day | ORAL | Status: DC
  Administered 2016-11-23 – 2016-11-24 (×2): 25 mg via ORAL
  Filled 2016-11-23 (×2): qty 1

## 2016-11-23 MED ORDER — MORPHINE SULFATE (PF) 2 MG/ML IV SOLN
2.0000 mg | INTRAVENOUS | Status: DC | PRN
  Administered 2016-11-23 – 2016-11-25 (×3): 2 mg via INTRAVENOUS
  Filled 2016-11-23 (×3): qty 1

## 2016-11-23 MED ORDER — OXYMORPHONE HCL ER 10 MG PO T12A: 20.0000 mg | EXTENDED_RELEASE_TABLET | Freq: Two times a day (BID) | ORAL | Status: DC

## 2016-11-23 MED ORDER — POLYETHYLENE GLYCOL 3350 17 G PO PACK
17.0000 g | PACK | Freq: Two times a day (BID) | ORAL | Status: DC
  Administered 2016-11-26 – 2016-11-27 (×2): 17 g via ORAL
  Filled 2016-11-23 (×7): qty 1

## 2016-11-23 MED ORDER — INSULIN ASPART 100 UNIT/ML ~~LOC~~ SOLN
0.0000 [IU] | Freq: Three times a day (TID) | SUBCUTANEOUS | Status: DC
  Administered 2016-11-24: 3 [IU] via SUBCUTANEOUS
  Administered 2016-11-25: 1 [IU] via SUBCUTANEOUS
  Administered 2016-11-25: 3 [IU] via SUBCUTANEOUS
  Administered 2016-11-26: 2 [IU] via SUBCUTANEOUS
  Administered 2016-11-26: 3 [IU] via SUBCUTANEOUS
  Administered 2016-11-26: 2 [IU] via SUBCUTANEOUS
  Administered 2016-11-27: 3 [IU] via SUBCUTANEOUS

## 2016-11-23 MED ORDER — ASPIRIN 81 MG PO CHEW
324.0000 mg | CHEWABLE_TABLET | Freq: Once | ORAL | Status: AC
  Administered 2016-11-23: 324 mg via ORAL
  Filled 2016-11-23: qty 4

## 2016-11-23 MED ORDER — ONDANSETRON HCL 4 MG/2ML IJ SOLN: 4.0000 mg | Freq: Four times a day (QID) | INTRAMUSCULAR | Status: DC | PRN

## 2016-11-23 MED ORDER — LACOSAMIDE 50 MG PO TABS
100.0000 mg | ORAL_TABLET | Freq: Two times a day (BID) | ORAL | Status: DC
  Administered 2016-11-23 – 2016-11-27 (×7): 100 mg via ORAL
  Filled 2016-11-23 (×7): qty 2

## 2016-11-23 MED ORDER — DEXTROSE 5 % IV SOLN
3.0000 g | Freq: Once | INTRAVENOUS | Status: AC
  Administered 2016-11-23: 3 g via INTRAVENOUS
  Filled 2016-11-23: qty 6

## 2016-11-23 MED ORDER — LINACLOTIDE 145 MCG PO CAPS
145.0000 ug | ORAL_CAPSULE | Freq: Every day | ORAL | Status: DC | PRN
  Filled 2016-11-23: qty 1

## 2016-11-23 MED ORDER — FOLIC ACID 1 MG PO TABS
1.0000 mg | ORAL_TABLET | Freq: Every day | ORAL | Status: DC
  Administered 2016-11-23 – 2016-11-27 (×4): 1 mg via ORAL
  Filled 2016-11-23 (×4): qty 1

## 2016-11-23 MED ORDER — ENSURE ENLIVE PO LIQD
237.0000 mL | Freq: Two times a day (BID) | ORAL | Status: DC
  Administered 2016-11-24 – 2016-11-27 (×5): 237 mL via ORAL

## 2016-11-23 MED ORDER — MOMETASONE FURO-FORMOTEROL FUM 200-5 MCG/ACT IN AERO
2.0000 | INHALATION_SPRAY | Freq: Two times a day (BID) | RESPIRATORY_TRACT | Status: DC
  Administered 2016-11-23 – 2016-11-27 (×5): 2 via RESPIRATORY_TRACT
  Filled 2016-11-23 (×2): qty 8.8

## 2016-11-23 MED ORDER — ALBUTEROL SULFATE (2.5 MG/3ML) 0.083% IN NEBU: 2.5000 mg | INHALATION_SOLUTION | Freq: Four times a day (QID) | RESPIRATORY_TRACT | Status: DC | PRN

## 2016-11-23 MED ORDER — ASPIRIN EC 325 MG PO TBEC: 325.0000 mg | DELAYED_RELEASE_TABLET | Freq: Every day | ORAL | Status: DC

## 2016-11-23 MED ORDER — PROMETHAZINE HCL 25 MG PO TABS: 25.0000 mg | ORAL_TABLET | Freq: Four times a day (QID) | ORAL | Status: DC | PRN

## 2016-11-23 MED ORDER — POTASSIUM CHLORIDE CRYS ER 20 MEQ PO TBCR
40.0000 meq | EXTENDED_RELEASE_TABLET | Freq: Three times a day (TID) | ORAL | Status: DC
  Administered 2016-11-23 (×2): 40 meq via ORAL
  Filled 2016-11-23 (×2): qty 2

## 2016-11-23 MED ORDER — GABAPENTIN 300 MG PO CAPS
300.0000 mg | ORAL_CAPSULE | Freq: Two times a day (BID) | ORAL | Status: DC
  Administered 2016-11-23 – 2016-11-26 (×5): 300 mg via ORAL
  Filled 2016-11-23 (×5): qty 1

## 2016-11-23 MED ORDER — ALPRAZOLAM 0.25 MG PO TABS
0.2500 mg | ORAL_TABLET | Freq: Two times a day (BID) | ORAL | Status: DC | PRN
  Administered 2016-11-24 – 2016-11-25 (×4): 0.25 mg via ORAL
  Filled 2016-11-23 (×4): qty 1

## 2016-11-23 MED ORDER — ACETAMINOPHEN 325 MG PO TABS: 650.0000 mg | ORAL_TABLET | ORAL | Status: DC | PRN

## 2016-11-23 NOTE — ED Notes (Signed)
Will, PA and Sue Lush, RN notified of pt troponin.

## 2016-11-23 NOTE — ED Triage Notes (Signed)
Pt reports chest pain since this morning. States she was supposed to have some sort of procedure at [facility]. She was dropped off here by her daughter

## 2016-11-23 NOTE — ED Notes (Signed)
EKG given to Will Dansie, PA to review

## 2016-11-23 NOTE — Progress Notes (Addendum)
60yoF  PMHx: seizures, HTN, stroke, DM, chronic pain  Pt had CP earlier this AM and radiation L neck. No N/V diaphoresis. No EKG changers significant per EDP.  Got ASA in ED. CP free on arrival. Trop 0.07   Admit for ACS r/o  EDP will order Mg and repeat EKG  Admit tele obs  Haydee Salter

## 2016-11-23 NOTE — ED Notes (Signed)
Patient transported to X-ray 

## 2016-11-23 NOTE — ED Notes (Signed)
Attempt to call report nurse not available 

## 2016-11-23 NOTE — ED Provider Notes (Signed)
[facility] Provider Note   CSN: [id number] Arrival date & time: 11/23/16  1138     History   Chief Complaint Chief Complaint  Patient presents with  . Chest Pain    HPI Amber [name] is a 61 y.o. female.  Amber [name] is a 61 y.o. Female with history of diabetes, seizures, frequent falls, and a CVA who presents to the emergency department complaining of chest pain onset last night. Patient reports she was sitting on her couch when she had onset of left-sided chest pain radiating up to her left neck that lasted for approximately 30 minutes with associated shortness of breath. She reports this morning she again had some chest pain with exertion walking to the bathroom. She reports this resolved with rest. She reports currently she just has chest soreness but no chest pain. She denies feeling short of breath currently. Patient was recently admitted after a CVA last month. Patient also had a TEE which showed a PFO. She had a VQ scan during her admission without evidence of PE. She denies history of MI. She reports on her way into the ER today she fell on her porch and felt dizzy. She reports frequent falls. She denies hitting her head or LOC. She does report some worsening right shoulder pain. She had an evidence of glenoid fracture on old shoulder x-ray from her previous admission. Patient denies being on anticoagulants currently. Patient denies active chest pain. She denies fevers, current shortness of breath, abdominal pain, nausea, vomiting, diarrhea, rashes, urinary symptoms, headache, or leg swelling.    The history is provided by the patient and medical records. No language interpreter was used.  Chest Pain   Associated symptoms include cough, dizziness and shortness of breath (Resolved.). Pertinent negatives include no abdominal pain, no back pain, no fever, no headaches, no nausea, no numbness, no palpitations and no vomiting.  Pertinent negatives for past medical history  include no seizures.    Past Medical History:  Diagnosis Date  . Arthritis   . Asthma   . Chronic pain   . Constipation   . Diabetes mellitus without complication (HCC)   . Embolic stroke (HCC)   . Falls frequently   . GERD (gastroesophageal reflux disease)   . Hypertension   . Pneumonia 10/2015  . Seizures [facility])    last seizure March 2015    Patient Active Problem List   Diagnosis Date Noted  . Chest pain 11/23/2016  . CVA (cerebral vascular accident) (HCC) 10/28/2016  . Tachycardia   . Seizure (HCC) 10/26/2016  . Embolic stroke (HCC) 10/26/2016  . New onset atrial fibrillation (HCC) 10/26/2016  . 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 Focal seizure (HCC)   . Shoulder pain, right   . Duodenitis   . Abdominal pain 11/07/2015  . Constipation 11/07/2015  . Abdominal distension   . Chest pain, musculoskeletal 08/24/2015  . Diabetes mellitus type 2, controlled (HCC) 07/27/2015  . Chronic back pain   . Frequent falls   . Essential hypertension   . Pulmonary hypertension   . Gout   . Asthma 06/12/2015  . ACS (acute coronary syndrome) (HCC) 06/12/2015  . Microcytic anemia 01/26/2015  . History of seizures 01/26/2015    Past Surgical History:  Procedure Laterality Date  . APPENDECTOMY    . BACK SURGERY    . CHOLECYSTECTOMY    . TEE WITHOUT CARDIOVERSION N/A 10/30/2016   Procedure: TRANSESOPHAGEAL ECHOCARDIOGRAM (TEE);  Surgeon: Lars Masson, MD;  Location: [facility] END[facility];  Service: Cardiovascular;  Laterality: N/A;    OB History    No data available       Home Medications    Prior to Admission medications   Medication Sig Start Date End Date Taking? Authorizing Provider  albuterol (PROVENTIL HFA;VENTOLIN HFA) 108 (90 BASE) MCG/ACT inhaler Inhale 2 puffs into the lungs every 6 (six) hours as needed for wheezing or shortness of breath.    Historical Provider, MD  amLODipine (NORVASC) 5 MG tablet Take 5 mg by mouth daily.     Historical Provider, MD  aspirin EC 325 MG EC tablet Take 1 tablet (325 mg total) by mouth daily. 10/31/16   Richarda Overlie, MD  atorvastatin (LIPITOR) 10 MG tablet Take 10 mg by mouth daily. 08/13/15   Historical Provider, MD  diazepam (VALIUM) 5 MG tablet Take 0.5 tablets (2.5 mg total) by mouth 2 (two) times daily. 10/31/16   Richarda Overlie, MD  enoxaparin (LOVENOX) 40 MG/0.4ML injection Inject 0.4 mLs (40 mg total) into the skin daily. 10/31/16 11/14/16  Richarda Overlie, MD  feeding supplement, ENSURE ENLIVE, (ENSURE ENLIVE) LIQD Take 237 mLs by mouth 2 (two) times daily between meals. 11/10/15   Catarina Hartshorn, MD  Fluticasone-Salmeterol (ADVAIR) 250-50 MCG/DOSE AEPB Inhale 1 puff into the lungs 2 (two) times daily.    Historical Provider, MD  folic acid (FOLVITE) 1 MG tablet Take 1 mg by mouth daily.    Historical Provider, MD  gabapentin (NEURONTIN) 300 MG capsule Take 1 capsule (300 mg total) by mouth 2 (two) times daily. 06/14/15   Richarda Overlie, MD  lacosamide 100 MG TABS Take 1 tablet (100 mg total) by mouth 2 (two) times daily. 10/31/16   Richarda Overlie, MD  lactulose (CHRONULAC) 10 GM/15ML solution Take 30 mLs (20 g total) by mouth 2 (two) times daily as needed for severe constipation. 08/22/16   Jacalyn Lefevre, MD  LINZESS 145 MCG CAPS capsule Take 1 capsule (145 mcg total) by mouth daily as needed (constipation). 05/26/16   Penny Pia, MD  metoprolol succinate (TOPROL-XL) 25 MG 24 hr tablet Take 25 mg by mouth daily.    Historical Provider, MD  Oxymorphone HCl, Crush Resist, 20 MG PO T12A Take 20 mg by mouth every 12 (twelve) hours. 10/31/16   Richarda Overlie, MD  pantoprazole (PROTONIX) 40 MG tablet Take 1 tablet (40 mg total) by mouth 2 (two) times daily before a meal. 12/25/15   Ripudeep K Rai, MD  polyethylene glycol (MIRALAX / GLYCOLAX) packet Take 17 g by mouth 2 (two) times daily.    Historical Provider, MD  predniSONE (DELTASONE) 10 MG tablet Take 15 mg by mouth daily with breakfast.    Historical  Provider, MD  promethazine (PHENERGAN) 25 MG tablet Take 1 tablet by mouth every 6 (six) hours as needed for nausea or vomiting. nausea 10/06/15   Historical Provider, MD  torsemide (DEMADEX) 10 MG tablet Take 10 mg by mouth daily.    Historical Provider, MD  Vitamin D, Ergocalciferol, (DRISDOL) 50000 UNITS CAPS capsule Take 1 capsule (50,000 Units total) by mouth every 7 (seven) days. Sunday Patient taking differently: Take 50,000 Units by mouth every 7 (seven) days. Monday 01/27/15   Calvert Cantor, MD    Family History Family History  Problem Relation Age of Onset  . Kidney failure Mother   . Hypertension Sister   . Hypertension Brother     Social History Social History  Substance Use Topics  . Smoking status: Former Smoker  Quit date: 2010  . Smokeless tobacco: Never Used  . Alcohol use No     Comment: admits to 2 drinks/week     Allergies   Ivp dye [iodinated diagnostic agents]   Review of Systems Review of Systems  Constitutional: Negative for chills and fever.  HENT: Negative for congestion and sore throat.   Eyes: Negative for visual disturbance.  Respiratory: Positive for cough and shortness of breath (Resolved.). Negative for wheezing.   Cardiovascular: Positive for chest pain. Negative for palpitations and leg swelling.  Gastrointestinal: Negative for abdominal pain, diarrhea, nausea and vomiting.  Genitourinary: Negative for difficulty urinating, dysuria, frequency and urgency.  Musculoskeletal: Positive for arthralgias. Negative for back pain and neck pain.  Skin: Negative for rash.  Neurological: Positive for dizziness. Negative for seizures, syncope, numbness and headaches.     Physical Exam Updated Vital Signs BP 129/82   Pulse 91   Temp 98.5 F (36.9 C) (Oral)   Resp 17   Ht 5' (1.524 m)   Wt 68.9 kg   SpO2 97%   BMI 29.69 kg/m   Physical Exam  Constitutional: She is oriented to person, place, and time. She appears well-developed and  well-nourished. No distress.  Nontoxic appearing. Patient appears older than her stated age.  HENT:  Head: Normocephalic and atraumatic.  Right Ear: External ear normal.  Left Ear: External ear normal.  Mouth/Throat: Oropharynx is clear and moist.  Eyes: Conjunctivae and EOM are normal. Pupils are equal, round, and reactive to light. Right eye exhibits no discharge. Left eye exhibits no discharge.  Neck: Normal range of motion. Neck supple. No JVD present.  Cardiovascular: Normal rate, regular rhythm, normal heart sounds and intact distal pulses.   No murmur heard. Bilateral radial and dorsalis pedis pulses are intact.  Pulmonary/Chest: Effort normal and breath sounds normal. No stridor. No respiratory distress. She has no wheezes. She has no rales. She exhibits tenderness.  Lungs clear to auscultation bilaterally. Symmetric chest expansion bilaterally. Left chest wall is tender to palpation.  Abdominal: Soft. There is no tenderness. There is no guarding.  Abdomen is soft and nontender to palpation.  Musculoskeletal: She exhibits no edema or tenderness.  No lower extremity edema or tenderness.  Lymphadenopathy:    She has no cervical adenopathy.  Neurological: She is alert and oriented to person, place, and time. No cranial nerve deficit or sensory deficit. Coordination normal.  Patient is alert and oriented 3. Cranial nerves are intact. Speech is clear and coherent.  Skin: Skin is warm and dry. Capillary refill takes less than 2 seconds. No rash noted. She is not diaphoretic. No erythema. No pallor.  Psychiatric: She has a normal mood and affect. Her behavior is normal.  Nursing note and vitals reviewed.    ED Treatments / Results  Labs (all labs ordered are listed, but only abnormal results are displayed) Labs Reviewed  BASIC METABOLIC PANEL - Abnormal; Notable for the following:       Result Value   Potassium 2.9 (*)    Chloride 114 (*)    CO2 21 (*)    Glucose, Bld 118 (*)      Calcium 8.5 (*)    All other components within normal limits  CBC WITH DIFFERENTIAL/PLATELET - Abnormal; Notable for the following:    Hemoglobin 10.0 (*)    HCT 30.3 (*)    MCV 76.9 (*)    MCH 25.4 (*)    All other components within normal limits  TROPONIN I -  Abnormal; Notable for the following:    Troponin I 0.07 (*)    All other components within normal limits  MAGNESIUM - Abnormal; Notable for the following:    Magnesium 1.2 (*)    All other components within normal limits    EKG  EKG Interpretation  Date/Time:  Thursday November 23 2016 12:01:52 EST Ventricular Rate:  107 PR Interval:  122 QRS Duration: 58 QT Interval:  290 QTC Calculation: 387 R Axis:   28 Text Interpretation:  Sinus tachycardia Nonspecific ST and T wave abnormality Abnormal ECG No STEMI.  Confirmed by LONG MD, JOSHUA [phone]) on 11/23/2016 1:29:23 PM       Radiology Dg Chest 2 View  Result Date: 11/23/2016 CLINICAL DATA:  Onset of chest pain last night. CVA 2 weeks ago. History of asthma, former smoker. EXAM: CHEST  2 VIEW COMPARISON:  PA and lateral chest x-ray of October 27, 2016 FINDINGS: The lungs are mildly hyperinflated with hemidiaphragm flattening. There is no significant pleural effusion. The cardiac silhouette is top-normal in size. The pulmonary vascularity is normal. The patient has undergone previous upper in mid posterior thoracic spinal fusion. There old deformities of the lateral aspects of the right third and fourth ribs. IMPRESSION: There is no acute pneumonia nor CHF. Chronic bronchitic changes, stable. Electronically Signed   By: David  [place] M.D.   On: 11/23/2016 12:34   Dg Shoulder Right  Result Date: 11/23/2016 CLINICAL DATA:  Right shoulder pain since a fall 2 weeks ago. Subsequent encounter. EXAM: RIGHT SHOULDER - 2+ VIEW COMPARISON:  Plain films of the right shoulder 10/26/2016 and 05/19/2016. FINDINGS: No fracture is identified. The humeral head is high-riding. 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 The humerus is located. Calcifications projecting along the posterior margin of the humeral head on the Y-view may be due to calcific rotator cuff tendinopathy. Spinal fusion hardware is noted IMPRESSION: No fracture is identified on today's examination. High-riding humeral head compatible with rotator cuff tear. Acromioclavicular osteoarthritis. Electronically Signed   By: Drusilla Kanner M.D.   On: 11/23/2016 13:22    Procedures Procedures (including critical care time)  Medications Ordered in ED Medications  aspirin chewable tablet 324 mg (324 mg Oral Given 11/23/16 1345)  potassium chloride SA (K-DUR,KLOR-CON) CR tablet 40 mEq (40 mEq Oral Given 11/23/16 1350)     Initial Impression / Assessment and Plan / ED Course  I have reviewed the triage vital signs and the nursing notes.  Pertinent labs & imaging results that were available during my care of the patient were reviewed by me and considered in my medical decision making (see chart for details).  Clinical Course    This is a 61 y.o. Female with history of diabetes, seizures, frequent falls, and a CVA who presents to the emergency department complaining of chest pain onset last night. Patient reports she was sitting on her couch when she had onset of left-sided chest pain radiating up to her left neck that lasted for approximately 30 minutes with associated shortness of breath. She reports this morning she again had some chest pain with exertion walking to the bathroom. She reports this resolved with rest. She reports currently she just has chest soreness but no chest pain. She denies feeling short of breath currently. Patient was recently admitted after a CVA last month. Patient also had a TEE which showed a PFO. She had a VQ scan during her admission without evidence of PE. She denies history of MI. She reports on her way  into the ER today she fell on her porch and felt dizzy. She reports frequent falls. She  denies hitting her head or LOC. She does report some worsening right shoulder pain. She had an evidence of glenoid fracture on old shoulder x-ray from her previous admission. Patient denies being on anticoagulants currently. Patient denies active chest pain.  On exam patient is afebrile nontoxic appearing. She appears older than her stated age. EKG shows a sinus tachycardia with some nonspecific ST changes. No evidence of STEMI on EKG. No significant changes from her last EKG.  BMPs his potassium at 2.9. CBC is around her baseline. Troponin is elevated at 0.07. Patient has had minor elevations of her troponin previously. This is slightly increased from previous. Patient without active chest pain. Will admit for ACS rule out. Patient agrees with plan.   I consulted with hospitalist Dr. Melynda Ripple who accepted the patient for admission and requested temp orders for tele bed.   This patient was discussed with Dr. Jacqulyn Bath who agrees with assessment and plan.   Final Clinical Impressions(s) / ED Diagnoses   Final diagnoses:  Chest pain, unspecified type  Elevated troponin I level    New Prescriptions New Prescriptions   No medications on file     Everlene Farrier, PA-C 11/23/16 1601    Maia Plan, MD 11/24/16 1025

## 2016-11-23 NOTE — ED Notes (Signed)
IV attempted x2 without success.

## 2016-11-23 NOTE — H&P (Addendum)
[facility] History and Physical  Eleri Ruben EPP:[id number] DOB: 1956-05-17 DOA: 11/23/2016  Referring physician:   PCP: Sandi Mariscal, MD   Chief Complaint: chest pain  HPI:    61 year old female with a history of recent embolic CVA, positive TCD bubble study recently, history of seizure disorder, hypertension, non-insulin-dependent diabetes mellitus, asthma, discharged to SNF on 10/31/16, presents to the ER at [facility] at [facility] because of onset of chest pain and headache last night that reminded her of her recent stroke. Patient described left-sided chest pain radiating into the left neck associated with shortness of breath. Symptoms lasted about 30 minutes. Patient states that the headache reminded her of her recent symptoms when she was diagnosed with a stroke. She also reports a nonproductive cough for the last couple of days. No fever. As a part of a stroke workup patient had  TCD which was positive followed by  TEE which showed a PFO. She was supposed to follow-up with Dr. Burt Knack for discussing PFO closure. She had a VQ scan 12/8 during her admission without evidence of PE.  She also has recent glenoid fracture of the right shoulder which was being managed conservatively Patient was discharged to SNF where she stayed about 4 or 5 days prior to returning home with her daughter, as she did not like her SNF  ED course-patient was found to be normotensive, nontoxic-appearing, initial workup revealed potassium of 2.9, magnesium 1.2, troponin 0.07, elevated d-dimer. EKG shows a sinus tachycardia with some nonspecific ST changes. No evidence of STEMI on EKG. No significant changes from her last EKG    Review of Systems: negative for the following  Constitutional: Denies fever, chills, diaphoresis, appetite change and fatigue.  HEENT: Denies photophobia, eye pain, redness, hearing loss, ear pain, congestion, sore throat, rhinorrhea, sneezing, mouth sores, trouble swallowing, neck pain,  neck stiffness and tinnitus.  Respiratory: Positive for cough and shortness of breath (Resolved.). Negative for wheezing.   Cardiovascular: Positive for chest pain. Negative for palpitations and leg swelling.  Gastrointestinal: Negative for abdominal pain, diarrhea, nausea and vomiting.  Genitourinary: Negative for difficulty urinating, dysuria, frequency and urgency.  Musculoskeletal: Positive for arthralgias. Negative for back pain and neck pain.  Skin: Negative for rash.  Neurological: Positive for dizziness. Negative for seizures, syncope, numbness and headaches  Hematological: Denies adenopathy. Easy bruising, personal or family bleeding history  Psychiatric/Behavioral: Denies suicidal ideation, mood changes, confusion, nervousness, sleep disturbance and agitation       Past Medical History:  Diagnosis Date  . Arthritis   . Asthma   . Chronic pain   . Constipation   . Diabetes mellitus without complication ([place])   . Embolic stroke ([place])   . Falls frequently   . GERD (gastroesophageal reflux disease)   . Hypertension   . Pneumonia 10/2015  . Seizures ([place])    last seizure March 2015     Past Surgical History:  Procedure Laterality Date  . APPENDECTOMY    . BACK SURGERY    . CHOLECYSTECTOMY    . TEE WITHOUT CARDIOVERSION N/A 10/30/2016   Procedure: TRANSESOPHAGEAL ECHOCARDIOGRAM (TEE);  Surgeon: Dorothy Spark, MD;  Location: [place];  Service: Cardiovascular;  Laterality: N/A;      Social History:  reports that she quit smoking about 8 years ago. She has never used smokeless tobacco. She reports that she does not drink alcohol or use drugs.    Allergies  Allergen Reactions  . Ivp Dye [Iodinated Diagnostic Agents] Itching  Family History  Problem Relation Age of Onset  . Kidney failure Mother   . Hypertension Sister   . Hypertension Brother         Prior to Admission medications   Medication Sig Start Date End Date Taking? Authorizing Provider   albuterol (PROVENTIL HFA;VENTOLIN HFA) 108 (90 BASE) MCG/ACT inhaler Inhale 2 puffs into the lungs every 6 (six) hours as needed for wheezing or shortness of breath.   Yes Historical Provider, MD  allopurinol (ZYLOPRIM) 300 MG tablet Take 300 mg by mouth daily. 11/11/16  Yes Historical Provider, MD  amLODipine (NORVASC) 5 MG tablet Take 5 mg by mouth daily.   Yes Historical Provider, MD  aspirin EC 325 MG EC tablet Take 1 tablet (325 mg total) by mouth daily. 10/31/16  Yes Reyne Dumas, MD  atorvastatin (LIPITOR) 10 MG tablet Take 10 mg by mouth daily. 08/13/15  Yes Historical Provider, MD  diazepam (VALIUM) 5 MG tablet Take 0.5 tablets (2.5 mg total) by mouth 2 (two) times daily. 10/31/16  Yes Reyne Dumas, MD  feeding supplement, ENSURE ENLIVE, (ENSURE ENLIVE) LIQD Take 237 mLs by mouth 2 (two) times daily between meals. 11/10/15  Yes Orson Eva, MD  Fluticasone-Salmeterol (ADVAIR) 250-50 MCG/DOSE AEPB Inhale 1 puff into the lungs 2 (two) times daily.   Yes Historical Provider, MD  folic acid (FOLVITE) 1 MG tablet Take 1 mg by mouth daily.   Yes Historical Provider, MD  gabapentin (NEURONTIN) 300 MG capsule Take 1 capsule (300 mg total) by mouth 2 (two) times daily. 06/14/15  Yes Reyne Dumas, MD  lacosamide 100 MG TABS Take 1 tablet (100 mg total) by mouth 2 (two) times daily. 10/31/16  Yes Reyne Dumas, MD  lactulose (CHRONULAC) 10 GM/15ML solution Take 30 mLs (20 g total) by mouth 2 (two) times daily as needed for severe constipation. 08/22/16  Yes Isla Pence, MD  LINZESS 145 MCG CAPS capsule Take 1 capsule (145 mcg total) by mouth daily as needed (constipation). 05/26/16  Yes Velvet Bathe, MD  metFORMIN (GLUCOPHAGE-XR) 500 MG 24 hr tablet Take 500 mg by mouth daily. 11/11/16  Yes Historical Provider, MD  metoprolol succinate (TOPROL-XL) 25 MG 24 hr tablet Take 25 mg by mouth daily.   Yes Historical Provider, MD  Oxymorphone HCl, Crush Resist, 20 MG PO T12A Take 20 mg by mouth every 12 (twelve)  hours. 10/31/16  Yes Reyne Dumas, MD  pantoprazole (PROTONIX) 40 MG tablet Take 1 tablet (40 mg total) by mouth 2 (two) times daily before a meal. 12/25/15  Yes Ripudeep K Rai, MD  polyethylene glycol (MIRALAX / GLYCOLAX) packet Take 17 g by mouth 2 (two) times daily.   Yes Historical Provider, MD  predniSONE (DELTASONE) 10 MG tablet Take 15 mg by mouth daily with breakfast.   Yes Historical Provider, MD  promethazine (PHENERGAN) 25 MG tablet Take 1 tablet by mouth every 6 (six) hours as needed for nausea or vomiting. nausea 10/06/15  Yes Historical Provider, MD  torsemide (DEMADEX) 10 MG tablet Take 10 mg by mouth daily.   Yes Historical Provider, MD  Vitamin D, Ergocalciferol, (DRISDOL) 50000 UNITS CAPS capsule Take 1 capsule (50,000 Units total) by mouth every 7 (seven) days. Sunday Patient taking differently: Take 50,000 Units by mouth every 7 (seven) days. Monday 01/27/15  Yes Debbe Odea, MD  enoxaparin (LOVENOX) 40 MG/0.4ML injection Inject 0.4 mLs (40 mg total) into the skin daily. 10/31/16 11/14/16  Reyne Dumas, MD     Physical Exam: Vitals:  11/23/16 1144 11/23/16 1314 11/23/16 1330 11/23/16 1644  BP: 140/98 152/99 129/82 107/85  Pulse: 112 84 91 96  Resp: _0 Temp: 98.5 F (36.9 C)   98.1 F (36.7 C)  TempSrc: Oral   Oral  SpO2: 99% 100% 97% 100%  Weight: 68.9 kg (152 lb)   49.4 kg (108 lb 14.4 oz)  Height: 5' (1.524 m)   5' (1.524 m)      Constitutional: NAD, calm, comfortable Vitals:   11/23/16 1144 11/23/16 1314 11/23/16 1330 11/23/16 1644  BP: 140/98 152/99 129/82 107/85  Pulse: 112 84 91 96  Resp: _1 Temp: 98.5 F (36.9 C)   98.1 F (36.7 C)  TempSrc: Oral   Oral  SpO2: 99% 100% 97% 100%  Weight: 68.9 kg (152 lb)   49.4 kg (108 lb 14.4 oz)  Height: 5' (1.524 m)   5' (1.524 m)   Eyes: PERRL, lids and conjunctivae normal ENMT: Mucous membranes are moist. Posterior pharynx clear of any exudate or lesions.Normal dentition.  Neck: normal,  supple, no masses, no thyromegaly Respiratory: clear to auscultation bilaterally, no wheezing, no crackles. Normal respiratory effort. No accessory muscle use.  Cardiovascular: Regular rate and rhythm, no murmurs / rubs / gallops. No extremity edema. 2+ pedal pulses. No carotid bruits.  Abdomen: no tenderness, no masses palpated. No hepatosplenomegaly. Bowel sounds positive.  Musculoskeletal: no clubbing / cyanosis. No joint deformity upper and lower extremities. Good ROM, no contractures. Normal muscle tone.  Skin: no rashes, lesions, ulcers. No induration Neurologic: CN 2-12 grossly intact. Sensation intact, DTR normal. Strength 4/5 in left upper and left lower extremity.  Psychiatric: Normal judgment and insight. Alert and oriented x 3. Normal mood.     Labs on Admission: I have personally reviewed following labs and imaging studies  CBC:  Recent Labs Lab 11/23/16 1248 11/23/16 1703  WBC 8.9 9.3  NEUTROABS 5.0  --   HGB 10.0* 9.1*  HCT 30.3* 27.9*  MCV 76.9* 77.3*  PLT 205 993    Basic Metabolic Panel:  Recent Labs Lab 11/23/16 1248  NA 144  K 2.9*  CL 114*  CO2 21*  GLUCOSE 118*  BUN 16  CREATININE 0.89  CALCIUM 8.5*  MG 1.2*    GFR: Estimated Creatinine Clearance: 49.5 mL/min (by C-G formula based on SCr of 0.89 mg/dL).  Liver Function Tests: No results for input(s): AST, ALT, ALKPHOS, BILITOT, PROT, ALBUMIN in the last 168 hours. No results for input(s): LIPASE, AMYLASE in the last 168 hours. No results for input(s): AMMONIA in the last 168 hours.  Coagulation Profile: No results for input(s): INR, PROTIME in the last 168 hours.  Recent Labs  11/23/16 1703  DDIMER 2.59*    Cardiac Enzymes:  Recent Labs Lab 11/23/16 1248  TROPONINI 0.07*    BNP (last 3 results) No results for input(s): PROBNP in the last 8760 hours.  HbA1C: No results for input(s): HGBA1C in the last 72 hours. Lab Results  Component Value Date   HGBA1C 4.7 (L) 10/27/2016    HGBA1C 5.5 02/29/2016   HGBA1C 5.8 (H) 11/07/2015     CBG:  Recent Labs Lab 11/23/16 1707  GLUCAP 126*    Lipid Profile: No results for input(s): CHOL, HDL, LDLCALC, TRIG, CHOLHDL, LDLDIRECT in the last 72 hours.  Thyroid Function Tests: No results for input(s): TSH, T4TOTAL, FREET4, T3FREE, THYROIDAB in the last 72 hours.  Anemia Panel: No results for input(s): VITAMINB12, FOLATE, FERRITIN,  TIBC, IRON, RETICCTPCT in the last 72 hours.  Urine analysis:    Component Value Date/Time   COLORURINE YELLOW 08/22/2016 2207   APPEARANCEUR CLEAR 08/22/2016 2207   LABSPEC 1.012 08/22/2016 2207   PHURINE 6.5 08/22/2016 2207   GLUCOSEU NEGATIVE 08/22/2016 2207   HGBUR NEGATIVE 08/22/2016 2207   BILIRUBINUR NEGATIVE 08/22/2016 2207   KETONESUR NEGATIVE 08/22/2016 2207   PROTEINUR NEGATIVE 08/22/2016 2207   UROBILINOGEN 1.0 07/27/2015 1052   NITRITE NEGATIVE 08/22/2016 2207   LEUKOCYTESUR NEGATIVE 08/22/2016 2207    Sepsis Labs: _0 (procalcitonin:4,lacticidven:4) )No results found for this or any previous visit (from the past 240 hour(s)).       Radiological Exams on Admission: Dg Chest 2 View  Result Date: 11/23/2016 CLINICAL DATA:  Onset of chest pain last night. CVA 2 weeks ago. History of asthma, former smoker. EXAM: CHEST  2 VIEW COMPARISON:  PA and lateral chest x-ray of October 27, 2016 FINDINGS: The lungs are mildly hyperinflated with hemidiaphragm flattening. There is no significant pleural effusion. The cardiac silhouette is top-normal in size. The pulmonary vascularity is normal. The patient has undergone previous upper in mid posterior thoracic spinal fusion. There old deformities of the lateral aspects of the right third and fourth ribs. IMPRESSION: There is no acute pneumonia nor CHF. Chronic bronchitic changes, stable. Electronically Signed   By: David  Martinique M.D.   On: 11/23/2016 12:34   Dg Shoulder Right  Result Date: 11/23/2016 CLINICAL DATA:  Right  shoulder pain since a fall 2 weeks ago. Subsequent encounter. EXAM: RIGHT SHOULDER - 2+ VIEW COMPARISON:  Plain films of the right shoulder 10/26/2016 and 05/19/2016. FINDINGS: No fracture is identified. The humeral head is high-riding. 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 The humerus is located. Calcifications projecting along the posterior margin of the humeral head on the Y-view may be due to calcific rotator cuff tendinopathy. Spinal fusion hardware is noted IMPRESSION: No fracture is identified on today's examination. High-riding humeral head compatible with rotator cuff tear. Acromioclavicular osteoarthritis. Electronically Signed   By: Inge Rise M.D.   On: 11/23/2016 13:22   Dg Chest 2 View  Result Date: 11/23/2016 CLINICAL DATA:  Onset of chest pain last night. CVA 2 weeks ago. History of asthma, former smoker. EXAM: CHEST  2 VIEW COMPARISON:  PA and lateral chest x-ray of October 27, 2016 FINDINGS: The lungs are mildly hyperinflated with hemidiaphragm flattening. There is no significant pleural effusion. The cardiac silhouette is top-normal in size. The pulmonary vascularity is normal. The patient has undergone previous upper in mid posterior thoracic spinal fusion. There old deformities of the lateral aspects of the right third and fourth ribs. IMPRESSION: There is no acute pneumonia nor CHF. Chronic bronchitic changes, stable. Electronically Signed   By: David  Martinique M.D.   On: 11/23/2016 12:34   Dg Chest 2 View  Result Date: 10/27/2016 CLINICAL DATA:  Stroke 2 days ago EXAM: CHEST  2 VIEW COMPARISON:  08/22/2016 FINDINGS: Cardiomegaly again noted. No infiltrate or pleural effusion. No pulmonary edema. Stable left basilar scarring. Metallic fixation rods thoracic spine again noted. Degenerative changes bilateral shoulders. IMPRESSION: No active cardiopulmonary disease. Cardiomegaly again noted. Stable left basilar scarring. Electronically Signed   By: Lahoma Crocker M.D.   On: 10/27/2016  16:44   Dg Shoulder Right  Result Date: 11/23/2016 CLINICAL DATA:  Right shoulder pain since a fall 2 weeks ago. Subsequent encounter. EXAM: RIGHT SHOULDER - 2+ VIEW COMPARISON:  Plain films of the right shoulder 10/26/2016 and 05/19/2016. FINDINGS:  No fracture is identified. The humeral head is high-riding. 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 The humerus is located. Calcifications projecting along the posterior margin of the humeral head on the Y-view may be due to calcific rotator cuff tendinopathy. Spinal fusion hardware is noted IMPRESSION: No fracture is identified on today's examination. High-riding humeral head compatible with rotator cuff tear. Acromioclavicular osteoarthritis. Electronically Signed   By: Inge Rise M.D.   On: 11/23/2016 13:22   Dg Shoulder Right  Result Date: 10/26/2016 CLINICAL DATA:  61 y/o F; status post fall with right shoulder pain. EXAM: RIGHT SHOULDER - 2+ VIEW COMPARISON:  05/19/2016 right shoulder radiographs. FINDINGS: The humeral head is seated on the glenoid. There is deformity of the glenoid rim most pronounced superiorly probably representing impacted fracture and there are small calcific densities surrounding the proximal humerus which may represent intra-articular bodies. Clavicle is obscured by the mandible. Chronic right upper posterior lateral rib fractures. IMPRESSION: Deformity of glenoid rim most pronounced superiorly may represent an impacted fracture. No dislocation. Ossific densities surrounding proximal humerus may represent intra-articular bodies. Electronically Signed   By: Kristine Garbe M.D.   On: 10/26/2016 15:11   Ct Head Wo Contrast  Result Date: 10/26/2016 CLINICAL DATA:  Fall 2 weeks ago. Fall backward after walker malfunction. Focal seizure. EXAM: CT HEAD WITHOUT CONTRAST TECHNIQUE: Contiguous axial images were obtained from the base of the skull through the vertex without intravenous contrast. COMPARISON:  Head CT  02/28/2016 FINDINGS: Brain: Focal cortical hypodensity in the posterior right frontal lobe concerning for subacute ischemia. No intracranial hemorrhage. Background chronic small vessel ischemia is similar. No subdural or extra-axial fluid collection. No hydrocephalus. Vascular: Atherosclerosis of skullbase vasculature without hyperdense vessel or abnormal calcification. Skull: No acute fracture. Stable sclerotic focus in the right frontal bone. Sinuses/Orbits: Paranasal sinuses and mastoid air cells are clear. The visualized orbits are unremarkable. Other: None. IMPRESSION: Findings concerning for subacute ischemia in the posterior right frontal lobe. These results were called by telephone at the time of interpretation on 10/26/2016 at 1:34 am to Dr. Delora Fuel , who verbally acknowledged these results. Electronically Signed   By: Jeb Levering M.D.   On: 10/26/2016 01:34   Mr Jodene Nam Head Wo Contrast  Result Date: 10/26/2016 CLINICAL DATA:  Suspected emboli. Multiple acute strokes. Seizure activity. EXAM: MRA NECK WITHOUT AND WITH CONTRAST MRA HEAD WITHOUT CONTRAST TECHNIQUE: Multiplanar and multiecho pulse sequences of the neck were obtained without and with intravenous contrast. Angiographic images of the neck were obtained using MRA technique without and with intravenous contrast.; Angiographic images of the Circle of Willis were obtained using MRA technique without intravenous contrast. CONTRAST:  65m MULTIHANCE GADOBENATE DIMEGLUMINE 529 MG/ML IV SOLN COMPARISON:  MR brain earlier today. FINDINGS: MRA NECK FINDINGS Marked dolichoectasia that suggesting longstanding hypertension. No visible ascending aortic dissection or proximal great vessel stenosis. Carotid bifurcations free of disease. No cervical ICA dissection or fibromuscular dysplasia. RIGHT vertebral is the sole contributor to the basilar. No flow-limiting stenosis through the neck or proximal intracranial segments. LEFT vertebral is poorly seen at  its origin and there may be a significant ostial stenosis. In the neck the LEFT vertebral ascends in a fairly normal caliber, not as large as the RIGHT. Intracranially, it terminates in PICA. MRA HEAD FINDINGS Minor non stenotic atheromatous change in the cavernous and supraclinoid ICA segments bilaterally. Minor irregularity the anterior and middle cerebral arteries without flow-limiting proximal stenosis. No MCA branch occlusion. Basilar artery hypoplastic. This is in part due  to BILATERAL fetal PCA anatomy. The RIGHT vertebral is the sole contributor to the basilar. LEFT vertebral terminates in PICA. Both PICA ease, and superior cerebellar arteries are patent. Anterior inferior cerebral artery is poorly visualized. Distal MCA and PCA branches are mildly irregular consistent with intracranial atherosclerotic change. No visible saccular aneurysm. IMPRESSION: No proximal intracranial stenosis or dissection. No extracranial carotid bifurcation, proximal ICA, or visible great vessel stenosis. Dominant RIGHT vertebral is the sole contributor to the basilar. LEFT vertebral terminates in PICA. Given the relatively unimpressive appearance of the extracranial and intracranial circulation, a cardiac source of emboli is suspected. No large vessel vasculitic change. Mild irregularity of the distal MCA and PCA branches more suggestive of intracranial atherosclerotic change Electronically Signed   By: Staci Righter M.D.   On: 10/26/2016 16:44   Mr Jodene Nam Neck W Wo Contrast  Result Date: 10/26/2016 CLINICAL DATA:  Suspected emboli. Multiple acute strokes. Seizure activity. EXAM: MRA NECK WITHOUT AND WITH CONTRAST MRA HEAD WITHOUT CONTRAST TECHNIQUE: Multiplanar and multiecho pulse sequences of the neck were obtained without and with intravenous contrast. Angiographic images of the neck were obtained using MRA technique without and with intravenous contrast.; Angiographic images of the Circle of Willis were obtained using MRA  technique without intravenous contrast. CONTRAST:  2m MULTIHANCE GADOBENATE DIMEGLUMINE 529 MG/ML IV SOLN COMPARISON:  MR brain earlier today. FINDINGS: MRA NECK FINDINGS Marked dolichoectasia that suggesting longstanding hypertension. No visible ascending aortic dissection or proximal great vessel stenosis. Carotid bifurcations free of disease. No cervical ICA dissection or fibromuscular dysplasia. RIGHT vertebral is the sole contributor to the basilar. No flow-limiting stenosis through the neck or proximal intracranial segments. LEFT vertebral is poorly seen at its origin and there may be a significant ostial stenosis. In the neck the LEFT vertebral ascends in a fairly normal caliber, not as large as the RIGHT. Intracranially, it terminates in PICA. MRA HEAD FINDINGS Minor non stenotic atheromatous change in the cavernous and supraclinoid ICA segments bilaterally. Minor irregularity the anterior and middle cerebral arteries without flow-limiting proximal stenosis. No MCA branch occlusion. Basilar artery hypoplastic. This is in part due to BILATERAL fetal PCA anatomy. The RIGHT vertebral is the sole contributor to the basilar. LEFT vertebral terminates in PICA. Both PICA ease, and superior cerebellar arteries are patent. Anterior inferior cerebral artery is poorly visualized. Distal MCA and PCA branches are mildly irregular consistent with intracranial atherosclerotic change. No visible saccular aneurysm. IMPRESSION: No proximal intracranial stenosis or dissection. No extracranial carotid bifurcation, proximal ICA, or visible great vessel stenosis. Dominant RIGHT vertebral is the sole contributor to the basilar. LEFT vertebral terminates in PICA. Given the relatively unimpressive appearance of the extracranial and intracranial circulation, a cardiac source of emboli is suspected. No large vessel vasculitic change. Mild irregularity of the distal MCA and PCA branches more suggestive of intracranial atherosclerotic  change Electronically Signed   By: JStaci RighterM.D.   On: 10/26/2016 16:44   Mr Brain Wo Contrast  Result Date: 10/26/2016 CLINICAL DATA:  Tremor, fell on Thanksgiving with head injury. Headache and dizziness. History of diabetes, seizures, hypertension. EXAM: MRI HEAD WITHOUT CONTRAST TECHNIQUE: Multiplanar, multiecho pulse sequences of the brain and surrounding structures were obtained without intravenous contrast. COMPARISON:  CT HEAD October 26, 2016 at 0101 hours FINDINGS: BRAIN: Confluent up to 4.2 cm reduced diffusion RIGHT posterior frontal parietal lobe, about the central sulcus. Corresponding low ADC values and faint T2 hyperintense signal. Numerous sub cm foci of reduced diffusion bilateral frontal, parietal and  bilateral occipital lobes. Symmetric reduced diffusion mesial thalamus suggests artery of [place]on. Multiple foci of reduced diffusion measure up to 11 mm in the cerebellum with low ADC values. Ventricles and sulci are normal for patient's age. Patchy to confluent supratentorial white matter FLAIR T2 hyperintensities exclusive of the aforementioned abnormalities. No midline shift, mass effect or masses. No abnormal extra-axial fluid collections. VASCULAR: Normal major intracranial vascular flow voids present at skull base. SKULL AND UPPER CERVICAL SPINE: No abnormal sellar expansion. No suspicious calvarial bone marrow signal. Craniocervical junction maintained. Grade 1 C3-4 and C4-5 anterolisthesis. SINUSES/ORBITS: The mastoid air-cells and included paranasal sinuses are well-aerated. The included ocular globes and orbital contents are non-suspicious. OTHER: Patient is edentulous. 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 Findings consistent with central embolic phenomena. Mild to moderate chronic small vessel ischemic disease, advanced for age. Electronically  Signed   By: Elon Alas M.D.   On: 10/26/2016 05:19   Nm Pulmonary Perf And Vent  Result Date: 10/27/2016 CLINICAL DATA:  Shortness of breath. EXAM: NUCLEAR MEDICINE VENTILATION - PERFUSION LUNG SCAN TECHNIQUE: Ventilation images were obtained in multiple projections using inhaled aerosol [id number]. Perfusion images were obtained in multiple projections after intravenous injection of Tc-948mAA. RADIOPHARMACEUTICALS:  31.6 mCi Technetium-9920mPA aerosol inhalation and 4.38 mCi Technetium-14m54m IV COMPARISON:  Chest x-ray today and chest CT 05/04/2016 FINDINGS: Ventilation: No focal ventilation defect. Perfusion: No wedge shaped peripheral perfusion defects to suggest acute pulmonary embolism. IMPRESSION: Normal VQ scan without evidence of pulmonary embolism. Electronically Signed   By: DaniMarin Olp.   On: 10/27/2016 16:59      EKG: Independently reviewed.  Date/Time:                  Thursday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

## 2016-11-23 NOTE — ED Notes (Signed)
Will [facility] ED Provider at bedside.

## 2016-11-24 DIAGNOSIS — I208 Other forms of angina pectoris: Secondary | ICD-10-CM | POA: Diagnosis not present

## 2016-11-24 DIAGNOSIS — I6300 Cerebral infarction due to thrombosis of unspecified precerebral artery: Secondary | ICD-10-CM | POA: Diagnosis not present

## 2016-11-24 DIAGNOSIS — Q211 Atrial septal defect: Secondary | ICD-10-CM

## 2016-11-24 DIAGNOSIS — I10 Essential (primary) hypertension: Secondary | ICD-10-CM

## 2016-11-24 DIAGNOSIS — I5032 Chronic diastolic (congestive) heart failure: Secondary | ICD-10-CM | POA: Diagnosis not present

## 2016-11-24 DIAGNOSIS — R0789 Other chest pain: Secondary | ICD-10-CM | POA: Diagnosis not present

## 2016-11-24 LAB — COMPREHENSIVE METABOLIC PANEL
ALBUMIN: 2.6 g/dL — AB (ref 3.5–5.0)
ALK PHOS: 102 U/L (ref 38–126)
ALT: 26 U/L (ref 14–54)
AST: 27 U/L (ref 15–41)
Anion gap: 6 (ref 5–15)
BILIRUBIN TOTAL: 0.5 mg/dL (ref 0.3–1.2)
BUN: 14 mg/dL (ref 6–20)
CALCIUM: 8.5 mg/dL — AB (ref 8.9–10.3)
CO2: 24 mmol/L (ref 22–32)
CREATININE: 0.98 mg/dL (ref 0.44–1.00)
Chloride: 113 mmol/L — ABNORMAL HIGH (ref 101–111)
GFR calc non Af Amer: >60 mL/min (ref >60)
GLUCOSE: 91 mg/dL (ref 65–99)
Potassium: 5.4 mmol/L — ABNORMAL HIGH (ref 3.5–5.1)
SODIUM: 143 mmol/L (ref 135–145)
Total Protein: 5.6 g/dL — ABNORMAL LOW (ref 6.5–8.1)

## 2016-11-24 LAB — GLUCOSE, CAPILLARY
Glucose-Capillary: 75 mg/dL (ref 65–99)
Glucose-Capillary: 82 mg/dL (ref 65–99)
Glucose-Capillary: 216 mg/dL — ABNORMAL HIGH (ref 65–99)
Glucose-Capillary: 95 mg/dL (ref 65–99)

## 2016-11-24 LAB — CBC
HEMATOCRIT: 30 % — AB (ref 36.0–46.0)
HEMOGLOBIN: 9.8 g/dL — AB (ref 12.0–15.0)
MCH: 25.1 pg — ABNORMAL LOW (ref 26.0–34.0)
MCHC: 32.7 g/dL (ref 30.0–36.0)
MCV: 76.9 fL — ABNORMAL LOW (ref 78.0–100.0)
Platelets: 174 10*3/uL (ref 150–400)
RBC: 3.9 MIL/uL (ref 3.87–5.11)
RDW: 15.1 % (ref 11.5–15.5)
WBC: 9.7 10*3/uL (ref 4.0–10.5)

## 2016-11-24 LAB — MAGNESIUM: Magnesium: 2.3 mg/dL (ref 1.7–2.4)

## 2016-11-24 MED ORDER — LACTULOSE 10 GM/15ML PO SOLN: 20.0000 g | Freq: Two times a day (BID) | ORAL | Status: DC | PRN

## 2016-11-24 MED ORDER — BUPRENORPHINE 5 MCG/HR TD PTWK: 5.0000 ug | MEDICATED_PATCH | TRANSDERMAL | Status: DC

## 2016-11-24 MED ORDER — PREDNISONE 5 MG PO TABS
15.0000 mg | ORAL_TABLET | Freq: Every day | ORAL | Status: DC
  Administered 2016-11-26 – 2016-11-27 (×2): 15 mg via ORAL
  Filled 2016-11-24 (×3): qty 1

## 2016-11-24 MED ORDER — OXYCODONE HCL 5 MG PO TABS
20.0000 mg | ORAL_TABLET | Freq: Four times a day (QID) | ORAL | Status: DC | PRN
  Administered 2016-11-24 – 2016-11-27 (×10): 20 mg via ORAL
  Filled 2016-11-24 (×10): qty 4
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
## 2016-11-24 NOTE — Progress Notes (Signed)
[facility] PROGRESS NOTE  Amber [name] QKM:[id number] DOB: 06/06/1956 DOA: 11/23/2016   PCP: Sandi Mariscal, MD     Assessment/Plan: Principal Problem:   Chest pain Active Problems:   History of seizures   Asthma   Diabetes mellitus type 2, controlled ([place])   Chronic back pain   Frequent falls   Essential hypertension   Pulmonary hypertension   Shoulder pain, right   CVA (cerebral vascular accident) [facility])  61 year old female with a history of recent embolic CVA, positive TCD bubble study recently, history of seizure disorder, hypertension, non-insulin-dependent diabetes mellitus, asthma, discharged to SNF on 10/31/16, presents to the ER at [facility] at [facility] because of onset of chest pain and headache last night that reminded her of her recent stroke. Patient described left-sided chest pain radiating into the left neck associated with shortness of breath. Symptoms lasted about 30 minutes. Patient states that the headache reminded her of her recent symptoms when she was diagnosed with a stroke. She also reports a nonproductive cough for the last couple of days. No fever. As a part of a stroke workup patient had  TCD which was positive followed by  TEE which showed a PFO. She was supposed to follow-up with Dr. Burt Knack for discussing PFO closure. She had a VQ scan 12/8 during her admission without evidence of PE.  She also has recent glenoid fracture of the right shoulder which was being managed conservatively Patient was discharged to SNF where she stayed about 4 or 5 days prior to returning home with her daughter, as she did not like her SNF  ED course-patient was found to be normotensive, nontoxic-appearing, initial workup revealed potassium of 2.9, magnesium 1.2, troponin 0.07, elevated d-dimer. EKG shows a sinus tachycardia with some nonspecific ST changes. No evidence of STEMI on EKG. 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 Sherren Mocha on 12/12 for PFO closure Plan was to start Plavix prior to PFO closure Patient has not had any ischemic workup Will consult cardiology  to see if patient needs to have ischemic workup for her chest pain this admission Continue aspirin, statin  Recent Embolic CVA Multiple bilateral anterior and supratentorial infarcts, embolic secondary to unknown source  MRInumerous supra and infratentorial bilateral embolic infarcts, largest at right MCA frontal cortical region  MRAhead and neckunremarkable  2D Echo-LV EF: 60% - 65%  TCD with bubble study - Transcranial Doppler Bubble Study indicative of a moderateright to left intracardiac shunt. We will have TEE on Monday to further confirm.  RUE and BLE venous doppler -Positive for chronic appearing thrombus in a short segment of the cephalic vein in the upper arm.Negative for DVT. Lower extremity DVT negative  EKG - sinus tach  TEE to look for embolic source.positive for PFO. Cardiology Dr Burt Knack  was notified  LDL33  HgbA1c4.7  UDS positive for opiates and benzodiazepines  Hypercoagulable work up negative(anti--DNA, ANA, cardiolipin antibody, homocystine[except this was mildly elevated], beta 2 glycoprotein, lupus anticoagulant, are all negative-CRP, ESR, HIV, RPR are all negative)  VitB12, TSH 0.85  EEG  unremarkable  Continue aspirin 325 mg as recommended by neurology,per dr cooper start Plavix prior to PFO closure   Impacted fracture of the right shoulder,Right glenoid fx Orthopedics consult requested-Dr.  Margarito Liner mx  Patient would need outpatient follow-up  Seizure Neurology continues to have concerns for seizure activity EEG diffuse cerebral dysfunction that is non-specific in etiology  Currently on Vimpat   HTN: -Permissive hypertension for now, resumemetoprolol and furosemide/K -Continue statin  NIDDM: Discontinued home oral hypoglycemics during her last admission, hemoglobin A1c 4.7,    Chronic pain Patient takes multiple narcotic medications at home  Oxymorphone,   Asthma: Without exacerbation, continue   Dulera  Anxiety-patient takes Valium at home  Minimize sedating and narcotic medications  Elevated d-dimer Doubt PE, check venous Doppler to rule out DVT       DVT prophylaxsis Lovenox  Code Status:  Full code     Family Communication: Discussed in detail with the patient, all imaging results, lab results explained to the patient   Disposition Plan: In a.m.      Cardiology to consult  Procedures:  None  Antibiotics: Anti-infectives    None         HPI/Subjective: Patient currently npo pending cardiology evaluation, currently denies ongoing chest pain   Objective: Vitals:   11/23/16 1330 11/23/16 1644 11/23/16 2029 11/24/16 0342  BP: 129/82 107/85 128/78 108/71  Pulse: 91 96  81  Resp: _0 Temp:  98.1 F (36.7 C)  97.7 F (36.5 C)  TempSrc:  Oral  Oral  SpO2: 97% 100% 100% 100%  Weight:  49.4 kg (108 lb 14.4 oz)    Height:  5' (1.524 m)     No intake or output data in the 24 hours ending 11/24/16 0818  Exam:  Examination:  General exam: Appears calm and comfortable  Respiratory system: Clear to auscultation. Respiratory effort normal. Cardiovascular system: S1 & S2 heard, RRR. No JVD, murmurs, rubs, gallops or clicks. No pedal edema. Gastrointestinal system: Abdomen is nondistended, soft and nontender. No organomegaly or masses felt. Normal bowel sounds heard. Central nervous system: Alert and  oriented. No focal neurological deficits. Extremities: Symmetric 5 x 5 power. Skin: No rashes, lesions or ulcers Psychiatry: Judgement and insight appear normal. Mood & affect appropriate.     Data Reviewed: I have personally reviewed following labs and imaging studies  Micro Results No results found for this or any previous visit (from the past 240 hour(s)).  Radiology Reports Dg Chest 2 View  Result Date: 11/23/2016 CLINICAL DATA:  Onset of chest pain last night. CVA 2 weeks ago. History of asthma, former smoker. EXAM: CHEST  2 VIEW COMPARISON:  PA and lateral chest x-ray of October 27, 2016 FINDINGS: The lungs are mildly hyperinflated with hemidiaphragm flattening. There is no significant pleural effusion. The cardiac silhouette is top-normal in size. The pulmonary vascularity is normal. The patient has undergone previous upper in mid posterior thoracic spinal fusion. There old deformities of the lateral aspects of the right third and fourth ribs. IMPRESSION: There is no acute pneumonia nor CHF. Chronic bronchitic changes, stable. Electronically Signed   By: David  Martinique M.D.   On: 11/23/2016 12:34   Dg Chest 2 View  Result Date: 10/27/2016 CLINICAL DATA:  Stroke 2 days ago EXAM: CHEST  2 VIEW COMPARISON:  08/22/2016 FINDINGS: Cardiomegaly again noted. No infiltrate or pleural effusion. No pulmonary edema. Stable left basilar scarring. Metallic fixation rods thoracic spine again noted. Degenerative changes bilateral shoulders.  IMPRESSION: No active cardiopulmonary disease. Cardiomegaly again noted. Stable left basilar scarring. Electronically Signed   By: Lahoma Crocker M.D.   On: 10/27/2016 16:44   Dg Shoulder Right  Result Date: 11/23/2016 CLINICAL DATA:  Right shoulder pain since a fall 2 weeks ago. Subsequent encounter. EXAM: RIGHT SHOULDER - 2+ VIEW COMPARISON:  Plain films of the right shoulder 10/26/2016 and 05/19/2016. FINDINGS: No fracture is identified. The humeral head is high-riding.  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 The humerus is located. Calcifications projecting along the posterior margin of the humeral head on the Y-view may be due to calcific rotator cuff tendinopathy. Spinal fusion hardware is noted IMPRESSION: No fracture is identified on today's examination. High-riding humeral head compatible with rotator cuff tear. Acromioclavicular osteoarthritis. Electronically Signed   By: Inge Rise M.D.   On: 11/23/2016 13:22   Dg Shoulder Right  Result Date: 10/26/2016 CLINICAL DATA:  61 y/o F; status post fall with right shoulder pain. EXAM: RIGHT SHOULDER - 2+ VIEW COMPARISON:  05/19/2016 right shoulder radiographs. FINDINGS: The humeral head is seated on the glenoid. There is deformity of the glenoid rim most pronounced superiorly probably representing impacted fracture and there are small calcific densities surrounding the proximal humerus which may represent intra-articular bodies. Clavicle is obscured by the mandible. Chronic right upper posterior lateral rib fractures. IMPRESSION: Deformity of glenoid rim most pronounced superiorly may represent an impacted fracture. No dislocation. Ossific densities surrounding proximal humerus may represent intra-articular bodies. Electronically Signed   By: Kristine Garbe M.D.   On: 10/26/2016 15:11   Ct Head Wo Contrast  Result Date: 10/26/2016 CLINICAL DATA:  Fall 2 weeks ago. Fall backward after walker malfunction. Focal seizure. EXAM: CT HEAD WITHOUT CONTRAST TECHNIQUE: Contiguous axial images were obtained from the base of the skull through the vertex without intravenous contrast. COMPARISON:  Head CT 02/28/2016 FINDINGS: Brain: Focal cortical hypodensity in the posterior right frontal lobe concerning for subacute ischemia. No intracranial hemorrhage. Background chronic small vessel ischemia is similar. No subdural or extra-axial fluid collection. No hydrocephalus. Vascular: Atherosclerosis of skullbase vasculature  without hyperdense vessel or abnormal calcification. Skull: No acute fracture. Stable sclerotic focus in the right frontal bone. Sinuses/Orbits: Paranasal sinuses and mastoid air cells are clear. The visualized orbits are unremarkable. Other: None. IMPRESSION: Findings concerning for subacute ischemia in the posterior right frontal lobe. These results were called by telephone at the time of interpretation on 10/26/2016 at 1:34 am to Dr. Delora Fuel , who verbally acknowledged these results. Electronically Signed   By: Jeb Levering M.D.   On: 10/26/2016 01:34   Mr Jodene Nam Head Wo Contrast  Result Date: 10/26/2016 CLINICAL DATA:  Suspected emboli. Multiple acute strokes. Seizure activity. EXAM: MRA NECK WITHOUT AND WITH CONTRAST MRA HEAD WITHOUT CONTRAST TECHNIQUE: Multiplanar and multiecho pulse sequences of the neck were obtained without and with intravenous contrast. Angiographic images of the neck were obtained using MRA technique without and with intravenous contrast.; Angiographic images of the Circle of Willis were obtained using MRA technique without intravenous contrast. CONTRAST:  24m MULTIHANCE GADOBENATE DIMEGLUMINE 529 MG/ML IV SOLN COMPARISON:  MR brain earlier today. FINDINGS: MRA NECK FINDINGS Marked dolichoectasia that suggesting longstanding hypertension. No visible ascending aortic dissection or proximal great vessel stenosis. Carotid bifurcations free of disease. No cervical ICA dissection or fibromuscular dysplasia. RIGHT vertebral is the sole contributor to the basilar. No flow-limiting stenosis through the neck or proximal intracranial segments. LEFT vertebral is poorly seen at its origin and there may be  a significant ostial stenosis. In the neck the LEFT vertebral ascends in a fairly normal caliber, not as large as the RIGHT. Intracranially, it terminates in PICA. MRA HEAD FINDINGS Minor non stenotic atheromatous change in the cavernous and supraclinoid ICA segments bilaterally. Minor  irregularity the anterior and middle cerebral arteries without flow-limiting proximal stenosis. No MCA branch occlusion. Basilar artery hypoplastic. This is in part due to BILATERAL fetal PCA anatomy. The RIGHT vertebral is the sole contributor to the basilar. LEFT vertebral terminates in PICA. Both PICA ease, and superior cerebellar arteries are patent. Anterior inferior cerebral artery is poorly visualized. Distal MCA and PCA branches are mildly irregular consistent with intracranial atherosclerotic change. No visible saccular aneurysm. IMPRESSION: No proximal intracranial stenosis or dissection. No extracranial carotid bifurcation, proximal ICA, or visible great vessel stenosis. Dominant RIGHT vertebral is the sole contributor to the basilar. LEFT vertebral terminates in PICA. Given the relatively unimpressive appearance of the extracranial and intracranial circulation, a cardiac source of emboli is suspected. No large vessel vasculitic change. Mild irregularity of the distal MCA and PCA branches more suggestive of intracranial atherosclerotic change Electronically Signed   By: Staci Righter M.D.   On: 10/26/2016 16:44   Mr Jodene Nam Neck W Wo Contrast  Result Date: 10/26/2016 CLINICAL DATA:  Suspected emboli. Multiple acute strokes. Seizure activity. EXAM: MRA NECK WITHOUT AND WITH CONTRAST MRA HEAD WITHOUT CONTRAST TECHNIQUE: Multiplanar and multiecho pulse sequences of the neck were obtained without and with intravenous contrast. Angiographic images of the neck were obtained using MRA technique without and with intravenous contrast.; Angiographic images of the Circle of Willis were obtained using MRA technique without intravenous contrast. CONTRAST:  54m MULTIHANCE GADOBENATE DIMEGLUMINE 529 MG/ML IV SOLN COMPARISON:  MR brain earlier today. FINDINGS: MRA NECK FINDINGS Marked dolichoectasia that suggesting longstanding hypertension. No visible ascending aortic dissection or proximal great vessel stenosis.  Carotid bifurcations free of disease. No cervical ICA dissection or fibromuscular dysplasia. RIGHT vertebral is the sole contributor to the basilar. No flow-limiting stenosis through the neck or proximal intracranial segments. LEFT vertebral is poorly seen at its origin and there may be a significant ostial stenosis. In the neck the LEFT vertebral ascends in a fairly normal caliber, not as large as the RIGHT. Intracranially, it terminates in PICA. MRA HEAD FINDINGS Minor non stenotic atheromatous change in the cavernous and supraclinoid ICA segments bilaterally. Minor irregularity the anterior and middle cerebral arteries without flow-limiting proximal stenosis. No MCA branch occlusion. Basilar artery hypoplastic. This is in part due to BILATERAL fetal PCA anatomy. The RIGHT vertebral is the sole contributor to the basilar. LEFT vertebral terminates in PICA. Both PICA ease, and superior cerebellar arteries are patent. Anterior inferior cerebral artery is poorly visualized. Distal MCA and PCA branches are mildly irregular consistent with intracranial atherosclerotic change. No visible saccular aneurysm. IMPRESSION: No proximal intracranial stenosis or dissection. No extracranial carotid bifurcation, proximal ICA, or visible great vessel stenosis. Dominant RIGHT vertebral is the sole contributor to the basilar. LEFT vertebral terminates in PICA. Given the relatively unimpressive appearance of the extracranial and intracranial circulation, a cardiac source of emboli is suspected. No large vessel vasculitic change. Mild irregularity of the distal MCA and PCA branches more suggestive of intracranial atherosclerotic change Electronically Signed   By: JStaci RighterM.D.   On: 10/26/2016 16:44   Mr Brain Wo Contrast  Result Date: 10/26/2016 CLINICAL DATA:  Tremor, fell on Thanksgiving with head injury. Headache and dizziness. History of diabetes, seizures, hypertension. EXAM: MRI  HEAD WITHOUT CONTRAST TECHNIQUE:  Multiplanar, multiecho pulse sequences of the brain and surrounding structures were obtained without intravenous contrast. COMPARISON:  CT HEAD October 26, 2016 at 0101 hours FINDINGS: BRAIN: Confluent up to 4.2 cm reduced diffusion RIGHT posterior frontal parietal lobe, about the central sulcus. Corresponding low ADC values and faint T2 hyperintense signal. Numerous sub cm foci of reduced diffusion bilateral frontal, parietal and bilateral occipital lobes. Symmetric reduced diffusion mesial thalamus suggests artery of [place]. Multiple foci of reduced diffusion measure up to 11 mm in the cerebellum with low ADC values. Ventricles and sulci are normal for patient's age. Patchy to confluent supratentorial white matter FLAIR T2 hyperintensities exclusive of the aforementioned abnormalities. No midline shift, mass effect or masses. No abnormal extra-axial fluid collections. VASCULAR: Normal major intracranial vascular flow voids present at skull base. SKULL AND UPPER CERVICAL SPINE: No abnormal sellar expansion. No suspicious calvarial bone marrow signal. Craniocervical junction maintained. Grade 1 C3-4 and C4-5 anterolisthesis. SINUSES/ORBITS: The mastoid air-cells and included paranasal sinuses are well-aerated. The included ocular globes and orbital contents are non-suspicious. OTHER: Patient is edentulous. 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 Findings consistent with central embolic phenomena. Mild to moderate chronic small vessel ischemic disease, advanced for age. Electronically Signed   By: Elon Alas M.D.   On: 10/26/2016 05:19   Nm Pulmonary Perf And Vent  Result Date: 10/27/2016 CLINICAL DATA:  Shortness of breath. EXAM: NUCLEAR MEDICINE VENTILATION - PERFUSION LUNG SCAN TECHNIQUE: Ventilation images were obtained in multiple projections using inhaled aerosol  Tc-60mDTPA. Perfusion images were obtained in multiple projections after intravenous injection of Tc-979mAA. RADIOPHARMACEUTICALS:  31.6 mCi Technetium-9958mPA aerosol inhalation and 4.38 mCi Technetium-20m69m IV COMPARISON:  Chest x-ray today and chest CT 05/04/2016 FINDINGS: Ventilation: No focal ventilation defect. Perfusion: No wedge shaped peripheral perfusion defects to suggest acute pulmonary embolism. IMPRESSION: Normal VQ scan without evidence of pulmonary embolism. Electronically Signed   By: DaniMarin Olp.   On: 10/27/2016 16:59     CBC  Recent Labs Lab 11/23/16 1248 11/23/16 1703 11/24/16 0320  WBC 8.9 9.3 9.7  HGB 10.0* 9.1* 9.8*  HCT 30.3* 27.9* 30.0*  PLT 205 190 174  MCV 76.9* 77.3* 76.9*  MCH 25.4* 25.2* 25.1*  MCHC 33.0 32.6 32.7  RDW 15.0 14.9 15.1  LYMPHSABS 2.9  --   --   MONOABS 1.0  --   --   EOSABS 0.0  --   --   BASOSABS 0.0  --   --     Chemistries   Recent Labs Lab 11/23/16 1248 11/23/16 1703 11/24/16 0320  NA 144  --  143  K 2.9*  --  5.4*  CL 114*  --  113*  CO2 21*  --  24  GLUCOSE 118*  --  91  BUN 16  --  14  CREATININE 0.89 0.84 0.98  CALCIUM 8.5*  --  8.5*  MG 1.2*  --  2.3  AST  --   --  27  ALT  --   --  26  ALKPHOS  --   --  102  BILITOT  --   --  0.5   ------------------------------------------------------------------------------------------------------------------ estimated creatinine clearance is 44.9 mL/min (by C-G formula based on SCr of 0.98 mg/dL). ------------------------------------------------------------------------------------------------------------------ No results for input(s): HGBA1C in the last 72 hours. ------------------------------------------------------------------------------------------------------------------ No results for input(s): CHOL, HDL, LDLCALC, TRIG, CHOLHDL, LDLDIRECT in  the last 72  hours. ------------------------------------------------------------------------------------------------------------------ No results for input(s): TSH, T4TOTAL, T3FREE, THYROIDAB in the last 72 hours.  Invalid input(s): FREET3 ------------------------------------------------------------------------------------------------------------------ No results for input(s): VITAMINB12, FOLATE, FERRITIN, TIBC, IRON, RETICCTPCT in the last 72 hours.  Coagulation profile No results for input(s): INR, PROTIME in the last 168 hours.   Recent Labs  11/23/16 1703  DDIMER 2.59*    Cardiac Enzymes  Recent Labs Lab 11/23/16 1703 11/23/16 2021 11/23/16 2231  TROPONINI 0.07* 0.07* 0.06*   ------------------------------------------------------------------------------------------------------------------ Invalid input(s): POCBNP   CBG:  Recent Labs Lab 11/23/16 1707 11/23/16 2153 11/24/16 0639  GLUCAP 126* 184* 75       Studies: Dg Chest 2 View  Result Date: 11/23/2016 CLINICAL DATA:  Onset of chest pain last night. CVA 2 weeks ago. History of asthma, former smoker. EXAM: CHEST  2 VIEW COMPARISON:  PA and lateral chest x-ray of October 27, 2016 FINDINGS: The lungs are mildly hyperinflated with hemidiaphragm flattening. There is no significant pleural effusion. The cardiac silhouette is top-normal in size. The pulmonary vascularity is normal. The patient has undergone previous upper in mid posterior thoracic spinal fusion. There old deformities of the lateral aspects of the right third and fourth ribs. IMPRESSION: There is no acute pneumonia nor CHF. Chronic bronchitic changes, stable. Electronically Signed   By: David  Martinique M.D.   On: 11/23/2016 12:34   Dg Shoulder Right  Result Date: 11/23/2016 CLINICAL DATA:  Right shoulder pain since a fall 2 weeks ago. Subsequent encounter. EXAM: RIGHT SHOULDER - 2+ VIEW COMPARISON:  Plain films of the right shoulder 10/26/2016 and 05/19/2016. FINDINGS:  No fracture is identified. The humeral head is high-riding. 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 The humerus is located. Calcifications projecting along the posterior margin of the humeral head on the Y-view may be due to calcific rotator cuff tendinopathy. Spinal fusion hardware is noted IMPRESSION: No fracture is identified on today's examination. High-riding humeral head compatible with rotator cuff tear. Acromioclavicular osteoarthritis. Electronically Signed   By: Inge Rise M.D.   On: 11/23/2016 13:22      Lab Results  Component Value Date   HGBA1C 4.7 (L) 10/27/2016   HGBA1C 5.5 02/29/2016   HGBA1C 5.8 (H) 11/07/2015   Lab Results  Component Value Date   LDLCALC 33 10/27/2016   CREATININE 0.98 11/24/2016       Scheduled Meds: . aspirin EC  325 mg Oral Daily  . atorvastatin  10 mg Oral Daily  . enoxaparin (LOVENOX) injection  40 mg Subcutaneous Q24H  . feeding supplement (ENSURE ENLIVE)  237 mL Oral BID BM  . folic acid  1 mg Oral Daily  . gabapentin  300 mg Oral BID  . insulin aspart  0-9 Units Subcutaneous TID WC  . lacosamide  100 mg Oral BID  . metoprolol succinate  25 mg Oral Daily  . mometasone-formoterol  2 puff Inhalation BID  . oxymorphone  20 mg Oral Q12H  . pantoprazole  40 mg Oral BID AC  . polyethylene glycol  17 g Oral BID  . torsemide  10 mg Oral Daily   Continuous Infusions:   LOS: 0 days    Time spent: >30 MINS    [facility]  [facility] Pager [phone]. If 7PM-7AM, please contact night-coverage at [email or web address], password [facility] 11/24/2016, 8:18 AM  LOS: 0 days

## 2016-11-24 NOTE — Consult Note (Addendum)
Cardiology Consult    Patient ID: Amber [name] MRN: [id number], DOB/AGE: June 15, 1956   Admit date: 11/23/2016 Date of Consult: 11/24/2016  Primary Physician: [name], MD Primary Cardiologist: Dr. Excell Seltzer (for PFO) Requesting Provider: Dr. Susie Cassette Reason for Consultation: Chest pain  Patient Profile    61 yo female with PMH of HTN, DM, chronic diastolic HF, chronic pain, asthma, CVA and PFO who presented with chest pain.   Past Medical History   Past Medical History:  Diagnosis Date  . Arthritis   . Asthma   . Chronic pain   . Constipation   . Diabetes mellitus without complication (HCC)   . Embolic stroke (HCC)   . Falls frequently   . GERD (gastroesophageal reflux disease)   . Hypertension   . Pneumonia 10/2015  . Seizures (HCC)    last seizure March 2015    Past Surgical History:  Procedure Laterality Date  . APPENDECTOMY    . BACK SURGERY    . CHOLECYSTECTOMY    . TEE WITHOUT CARDIOVERSION N/A 10/30/2016   Procedure: TRANSESOPHAGEAL ECHOCARDIOGRAM (TEE);  Surgeon: Lars Masson, MD;  Location: [facility] ENDOSCOPY;  Service: Cardiovascular;  Laterality: N/A;     Allergies  Allergies  Allergen Reactions  . Ivp Dye [Iodinated Diagnostic Agents] Itching    History of Present Illness    Amber [name] is a 61 yo female with PMH of HTN, DM, chronic diastolic HF, chronic pain, asthma, CVA and PFO. She presented with weakness, tremor, and near syncope on 10/25/16. Underwent MRI and diagnosed with acute stroke, noting multiple bilateral cerebral infarcts consistent with a central embolic phenomena. She did well regarding that admission and was discharged to skilled nursing facility for further rehabilitation or recovery. She was referred to Dr. Excell Seltzer after undergoing a bubble study noting a PFO. Images reviewed by Dr. Excell Seltzer noted a large right to left shunt at the level of the atrial septum with Valsalva. The option of transcatheter PFO closure was presented at that visit, and  she made the decision to undergo procedure. Plan at that time was to see her back in the office to schedule her for PFO closure what she completed rehabilitation at the skilled nursing facility. She was replaced on statin drug and antiplatelet therapy with aspirin during her previous admission.   States she only stayed in the rehabilitation facility for about a week, but was unhappy there so her daughter brought her home. Since that time she has not been very independent at home, and is very unsteady on her feet requiring a walker. She has sustained multiple falls, and also a right glenoid fracture. Also noted she has been under increased stress recently, and is worried that she may have another stroke. Has been intermittently experiencing burning sensation in her chest, which she attributed to GERD. States she's been taking Nexium which has relieved her symptoms. Reports Wednesday night she was with her kids, and they were upsetting her. States she developed a frontal headache, with a pressure/burning sensation in her chest that radiated up into posterior neck, and left axilla. States she became concerned because these were similar symptoms to when she had her previous CVA. Symptoms did eventually subside, but she presented to [facility] for further evaluation.   In the ED her labs showed potassium 2.9, mag 1.2 creatinine 0.89, troponin 0.07>>0.07>>0.07>>0.06,  hemoglobin 10, d-dimer 2.59. Chest x-ray showed no acute findings, right shoulder x-ray showed a fracture. EKG showed sinus rhythm with nonspecific ST changes noticed on previous tracings.  She was transferred to [facility] her internal medicine hospital admission.  Inpatient Medications    . aspirin EC  325 mg Oral Daily  . atorvastatin  10 mg Oral Daily  . buprenorphine  5 mcg Transdermal Weekly  . enoxaparin (LOVENOX) injection  40 mg Subcutaneous Q24H  . feeding supplement (ENSURE ENLIVE)  237 mL Oral BID BM  . folic acid  1 mg Oral Daily  . gabapentin   300 mg Oral BID  . insulin aspart  0-9 Units Subcutaneous TID WC  . lacosamide  100 mg Oral BID  . metoprolol succinate  25 mg Oral Daily  . mometasone-formoterol  2 puff Inhalation BID  . pantoprazole  40 mg Oral BID AC  . polyethylene glycol  17 g Oral BID  . [START ON 11/25/2016] predniSONE  15 mg Oral Q breakfast  . torsemide  10 mg Oral Daily    Family History    Family History  Problem Relation Age of Onset  . Kidney failure Mother   . Hypertension Sister   . Hypertension Brother     Social History    Social History   Social History  . Marital status: Single    Spouse name: N/A  . Number of children: N/A  . Years of education: N/A   Occupational History  . Not on file.   Social History Main Topics  . Smoking status: Former Smoker    Quit date: 2010  . Smokeless tobacco: Never Used  . Alcohol use No     Comment: admits to 2 drinks/week  . Drug use: No  . Sexual activity: No   Other Topics Concern  . Not on file   Social History Narrative  . No narrative on file     Review of Systems    General:  No chills, fever, night sweats or weight changes.  Cardiovascular:  See HPI Dermatological: No rash, lesions/masses Respiratory: No cough, dyspnea Urologic: No hematuria, dysuria Abdominal:   No nausea, vomiting, diarrhea, bright red blood per rectum, melena, or hematemesis Neurologic:  No visual changes, wkns, changes in mental status. All other systems reviewed and are otherwise negative except as noted above.  Physical Exam    Blood pressure 108/71, pulse 81, temperature 97.7 F (36.5 C), temperature source Oral, resp. rate 18, height 5' (1.524 m), weight 108 lb 14.4 oz (49.4 kg), SpO2 100 %.  General: Pleasant AAF, NAD. Psych: Normal affect. Neuro: Alert and oriented X 3. Moves all extremities spontaneously, left sided weakness. HEENT: Normal  Neck: Supple without bruits or JVD. Lungs:  Resp regular and unlabored, CTA. Heart: RRR no s3, s4, or  murmurs. Abdomen: Soft, non-tender, non-distended, BS + x 4.  Extremities: No clubbing, cyanosis or edema. DP/PT/Radials 2+ and equal bilaterally.  Labs    Troponin (Point of Care Test) No results for input(s): TROPIPOC in the last 72 hours.  Recent Labs  11/23/16 1248 11/23/16 1703 11/23/16 2021 11/23/16 2231  TROPONINI 0.07* 0.07* 0.07* 0.06*   Lab Results  Component Value Date   WBC 9.7 11/24/2016   HGB 9.8 (L) 11/24/2016   HCT 30.0 (L) 11/24/2016   MCV 76.9 (L) 11/24/2016   PLT 174 11/24/2016    Recent Labs Lab 11/24/16 0320  NA 143  K 5.4*  CL 113*  CO2 24  BUN 14  CREATININE 0.98  CALCIUM 8.5*  PROT 5.6*  BILITOT 0.5  ALKPHOS 102  ALT 26  AST 27  GLUCOSE 91   Lab Results  Component Value Date   CHOL 105 10/27/2016   HDL 61 10/27/2016   LDLCALC 33 10/27/2016   TRIG 55 10/27/2016   Lab Results  Component Value Date   DDIMER 2.59 (H) 11/23/2016     Radiology Studies    Dg Chest 2 View  Result Date: 11/23/2016 CLINICAL DATA:  Onset of chest pain last night. CVA 2 weeks ago. History of asthma, former smoker. EXAM: CHEST  2 VIEW COMPARISON:  PA and lateral chest x-ray of October 27, 2016 FINDINGS: The lungs are mildly hyperinflated with hemidiaphragm flattening. There is no significant pleural effusion. The cardiac silhouette is top-normal in size. The pulmonary vascularity is normal. The patient has undergone previous upper in mid posterior thoracic spinal fusion. There old deformities of the lateral aspects of the right third and fourth ribs. IMPRESSION: There is no acute pneumonia nor CHF. Chronic bronchitic changes, stable. Electronically Signed   By: David  [place] M.D.   On: 11/23/2016 12:34    Dg Shoulder Right  Result Date: 11/23/2016 CLINICAL DATA:  Right shoulder pain since a fall 2 weeks ago. Subsequent encounter. EXAM: RIGHT SHOULDER - 2+ VIEW COMPARISON:  Plain films of the right shoulder 10/26/2016 and 05/19/2016. FINDINGS: No fracture is  identified. The humeral head is high-riding. 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 The humerus is located. Calcifications projecting along the posterior margin of the humeral head on the Y-view may be due to calcific rotator cuff tendinopathy. Spinal fusion hardware is noted IMPRESSION: No fracture is identified on today's examination. High-riding humeral head compatible with rotator cuff tear. Acromioclavicular osteoarthritis. Electronically Signed   By: Drusilla Kanner M.D.   On: 11/23/2016 13:22    ECG & Cardiac Imaging    EKG: SR with nonspecific ST changes noted on  Previous tracings  Echo: 10/27/16  Study Conclusions  - Left ventricle: The cavity size was normal. There was mild   concentric hypertrophy. Systolic function was normal. The   estimated ejection fraction was in the range of 60% to 65%. Wall   motion was normal; there were no regional wall motion   abnormalities. There was an increased relative contribution of   atrial contraction to ventricular filling. Doppler parameters are   consistent with abnormal left ventricular relaxation (grade 1   diastolic dysfunction). - Mitral valve: Calcified annulus. Moderate focal calcification of   the anterior leaflet (medial segment(s)). - Pulmonary arteries: Systolic pressure could not be accurately   estimated.  Assessment & Plan    61 yo female with PMH of HTN, DM, seizure, chronic diastolic HF, chronic pain, asthma, CVA and PFO who presented with chest pain.   1. Chest pain: Chest pain is fairly atypical in nature. Reports she has been experiencing some intermittent chest burning which has been relieved by Nexium recently. Reports 2 nights ago she became upset with her children and began to experience a frontal headache, chest burning/pressure with radiation into her posterior neck and left axilla along with shortness of breath. States that the symptoms lingered for about 30 minutes or so and then resolved. She has a low  activity level after having a stroke last month, and uses a walker to ambulate. States she does not normally get chest pain with this minimal exertion. Denies any family history of CAD, reports she has never had a ischemic workup in the past. Does have concerning risk factors including hypertension, hyperlipidemia, diabetes. EKG was nonacute, troponins with mild flat non-ACS trend. -- Given her risk factors, may benefit from further  ischemic workup with Lexiscan Myoview. Echo during previous admission showed a normal EF, with grade 1 diastolic dysfunction and no WMA.   2. CVA with PFO: Saw Dr. Excell Seltzer in the office for planned PFO closure once her health is improved with rehabilitation.  -- On ASA and Statin therapy  3. HTN: Controlled with current therapy, reports being compliant with medications at home.  4. Chronic diastolic heart failure: Does not appear to be volume overloaded on exam, negative chest x-ray on admission. We'll continue with home dose of oral diuretic.  5. Elevated Ddimer: VQ scan during previous admission was neg for PE, dopplers ordered via primary.  Janice Coffin, NP-C Pager [phone] 11/24/2016, 11:21 AM   The patient has been seen in conjunction with Laverda Page, NP-C. All aspects of care have been considered and discussed. The patient has been personally interviewed, examined, and all clinical data has been reviewed.   Relatively brief chest pain syndrome. No acute EKG changes. Flat troponin pattern. Current presentation does not confirm acute coronary syndrome. Echocardiogram performed in December revealed normal LV function. LDL cholesterol was 33 and December 2017. Given risk factors for coronary disease, myocardial perfusion imaging to evaluate for ischemia is reasonable.  It is not clear that current chest pain presentation is cardiac in origin.  Plan Lexiscan myocardial perfusion imaging in a.m. if imaging does not reveal significant perfusion  abnormality, no further evaluation will be required.  Aspirin, continuous monitoring, repeat EKG in a.m.

## 2016-11-25 ENCOUNTER — Observation Stay (HOSPITAL_COMMUNITY): Payer: Medicaid Other

## 2016-11-25 DIAGNOSIS — R079 Chest pain, unspecified: Secondary | ICD-10-CM

## 2016-11-25 DIAGNOSIS — I4891 Unspecified atrial fibrillation: Secondary | ICD-10-CM | POA: Diagnosis present

## 2016-11-25 DIAGNOSIS — G40909 Epilepsy, unspecified, not intractable, without status epilepticus: Secondary | ICD-10-CM | POA: Diagnosis present

## 2016-11-25 DIAGNOSIS — G8929 Other chronic pain: Secondary | ICD-10-CM | POA: Diagnosis present

## 2016-11-25 DIAGNOSIS — E119 Type 2 diabetes mellitus without complications: Secondary | ICD-10-CM | POA: Diagnosis present

## 2016-11-25 DIAGNOSIS — R748 Abnormal levels of other serum enzymes: Secondary | ICD-10-CM | POA: Diagnosis present

## 2016-11-25 DIAGNOSIS — J9811 Atelectasis: Secondary | ICD-10-CM | POA: Diagnosis present

## 2016-11-25 DIAGNOSIS — R296 Repeated falls: Secondary | ICD-10-CM | POA: Diagnosis present

## 2016-11-25 DIAGNOSIS — I200 Unstable angina: Secondary | ICD-10-CM | POA: Diagnosis not present

## 2016-11-25 DIAGNOSIS — I110 Hypertensive heart disease with heart failure: Secondary | ICD-10-CM | POA: Diagnosis present

## 2016-11-25 DIAGNOSIS — M549 Dorsalgia, unspecified: Secondary | ICD-10-CM | POA: Diagnosis present

## 2016-11-25 DIAGNOSIS — K219 Gastro-esophageal reflux disease without esophagitis: Secondary | ICD-10-CM | POA: Diagnosis present

## 2016-11-25 DIAGNOSIS — Q211 Atrial septal defect: Secondary | ICD-10-CM | POA: Diagnosis not present

## 2016-11-25 DIAGNOSIS — R0789 Other chest pain: Secondary | ICD-10-CM | POA: Diagnosis not present

## 2016-11-25 DIAGNOSIS — J690 Pneumonitis due to inhalation of food and vomit: Secondary | ICD-10-CM | POA: Diagnosis not present

## 2016-11-25 DIAGNOSIS — I6300 Cerebral infarction due to thrombosis of unspecified precerebral artery: Secondary | ICD-10-CM | POA: Diagnosis not present

## 2016-11-25 DIAGNOSIS — R000 Tachycardia, unspecified: Secondary | ICD-10-CM | POA: Diagnosis present

## 2016-11-25 DIAGNOSIS — J45909 Unspecified asthma, uncomplicated: Secondary | ICD-10-CM | POA: Diagnosis present

## 2016-11-25 DIAGNOSIS — M5440 Lumbago with sciatica, unspecified side: Secondary | ICD-10-CM | POA: Diagnosis not present

## 2016-11-25 DIAGNOSIS — I5032 Chronic diastolic (congestive) heart failure: Secondary | ICD-10-CM | POA: Diagnosis present

## 2016-11-25 DIAGNOSIS — S42141A Displaced fracture of glenoid cavity of scapula, right shoulder, initial encounter for closed fracture: Secondary | ICD-10-CM | POA: Diagnosis present

## 2016-11-25 DIAGNOSIS — E876 Hypokalemia: Secondary | ICD-10-CM | POA: Diagnosis present

## 2016-11-25 DIAGNOSIS — R791 Abnormal coagulation profile: Secondary | ICD-10-CM | POA: Diagnosis present

## 2016-11-25 DIAGNOSIS — W19XXXA Unspecified fall, initial encounter: Secondary | ICD-10-CM | POA: Diagnosis present

## 2016-11-25 DIAGNOSIS — R400 Somnolence: Secondary | ICD-10-CM | POA: Diagnosis not present

## 2016-11-25 DIAGNOSIS — I272 Pulmonary hypertension, unspecified: Secondary | ICD-10-CM | POA: Diagnosis present

## 2016-11-25 DIAGNOSIS — J4520 Mild intermittent asthma, uncomplicated: Secondary | ICD-10-CM | POA: Diagnosis not present

## 2016-11-25 DIAGNOSIS — F419 Anxiety disorder, unspecified: Secondary | ICD-10-CM | POA: Diagnosis present

## 2016-11-25 DIAGNOSIS — G9340 Encephalopathy, unspecified: Secondary | ICD-10-CM | POA: Diagnosis not present

## 2016-11-25 DIAGNOSIS — F13239 Sedative, hypnotic or anxiolytic dependence with withdrawal, unspecified: Secondary | ICD-10-CM | POA: Diagnosis present

## 2016-11-25 LAB — GLUCOSE, CAPILLARY
GLUCOSE-CAPILLARY: 155 mg/dL — AB (ref 65–99)
GLUCOSE-CAPILLARY: 202 mg/dL — AB (ref 65–99)
Glucose-Capillary: 123 mg/dL — ABNORMAL HIGH (ref 65–99)
Glucose-Capillary: 212 mg/dL — ABNORMAL HIGH (ref 65–99)
Glucose-Capillary: 216 mg/dL — ABNORMAL HIGH (ref 65–99)

## 2016-11-25 LAB — NM MYOCAR MULTI W/SPECT W/WALL MOTION / EF: Rest HR: 108 {beats}/min

## 2016-11-25 MED ORDER — MAGNESIUM OXIDE 400 MG PO CAPS: 400.0000 mg | ORAL_CAPSULE | Freq: Two times a day (BID) | ORAL | 0 refills | Status: DC

## 2016-11-25 MED ORDER — REGADENOSON 0.4 MG/5ML IV SOLN
0.4000 mg | Freq: Once | INTRAVENOUS | Status: AC
Start: 2016-11-25 — End: 2016-11-25
  Administered 2016-11-25: 0.4 mg via INTRAVENOUS

## 2016-11-25 MED ORDER — METOPROLOL TARTRATE 25 MG PO TABS
25.0000 mg | ORAL_TABLET | Freq: Once | ORAL | Status: AC
  Administered 2016-11-25: 25 mg via ORAL
  Filled 2016-11-25: qty 1

## 2016-11-25 MED ORDER — SODIUM CHLORIDE 0.9 % IV SOLN: INTRAVENOUS | Status: DC

## 2016-11-25 MED ORDER — POTASSIUM CHLORIDE ER 20 MEQ PO TBCR: 40.0000 meq | EXTENDED_RELEASE_TABLET | Freq: Every day | ORAL | 1 refills | Status: DC

## 2016-11-25 MED ORDER — TECHNETIUM TC 99M TETROFOSMIN IV KIT
10.0000 | PACK | Freq: Once | INTRAVENOUS | Status: AC | PRN
  Administered 2016-11-25: 10 via INTRAVENOUS

## 2016-11-25 MED ORDER — METOPROLOL SUCCINATE ER 50 MG PO TB24: 50.0000 mg | ORAL_TABLET | Freq: Every day | ORAL | 3 refills | Status: DC

## 2016-11-25 MED ORDER — TECHNETIUM TC 99M TETROFOSMIN IV KIT
30.0000 | PACK | Freq: Once | INTRAVENOUS | Status: AC | PRN
  Administered 2016-11-25: 30 via INTRAVENOUS

## 2016-11-25 MED ORDER — METOPROLOL SUCCINATE ER 50 MG PO TB24
50.0000 mg | ORAL_TABLET | Freq: Every day | ORAL | Status: DC
Start: 2016-11-26 — End: 2016-11-27
  Administered 2016-11-26 – 2016-11-27 (×2): 50 mg via ORAL
  Filled 2016-11-25 (×2): qty 1

## 2016-11-25 MED ORDER — ENOXAPARIN SODIUM 60 MG/0.6ML ~~LOC~~ SOLN
50.0000 mg | Freq: Two times a day (BID) | SUBCUTANEOUS | Status: DC
  Administered 2016-11-25 – 2016-11-27 (×4): 50 mg via SUBCUTANEOUS
  Filled 2016-11-25 (×4): qty 0.6

## 2016-11-25 MED ORDER — REGADENOSON 0.4 MG/5ML IV SOLN
INTRAVENOUS | Status: AC
  Filled 2016-11-25: qty 5

## 2016-11-25 NOTE — Progress Notes (Signed)
ANTICOAGULATION CONSULT NOTE - Initial Consult  Pharmacy Consult for lovenox Indication: r/o PE  Allergies  Allergen Reactions  . Ivp Dye [Iodinated Diagnostic Agents] Itching    Patient Measurements: Height: 5' (152.4 cm) Weight: 108 lb 14.4 oz (49.4 kg) IBW/kg (Calculated) : 45.5  Vital Signs: BP: 122/65 (01/06 1159) Pulse Rate: 136 (01/06 1159)  Labs:  Recent Labs  11/23/16 1248 11/23/16 1703 11/23/16 2021 11/23/16 2231 11/24/16 0320  HGB 10.0* 9.1*  --   --  9.8*  HCT 30.3* 27.9*  --   --  30.0*  PLT 205 190  --   --  174  CREATININE 0.89 0.84  --   --  0.98  TROPONINI 0.07* 0.07* 0.07* 0.06*  --     Estimated Creatinine Clearance: 44.9 mL/min (by C-G formula based on SCr of 0.98 mg/dL).   Medical History: Past Medical History:  Diagnosis Date  . Arthritis   . Asthma   . Chronic pain   . Constipation   . Diabetes mellitus without complication (HCC)   . Embolic stroke (HCC)   . Falls frequently   . GERD (gastroesophageal reflux disease)   . Hypertension   . Pneumonia 10/2015  . Seizures (HCC)    last seizure March 2015   Assessment: 76 yof presented with CP. Dopplers negative for DVT but now VQ ordered for r/o PE. Will start lovenox in the meantime.   Goal of Therapy:  Anti-Xa level 0.6-1 units/ml 4hrs after LMWH dose given Monitor platelets by anticoagulation protocol: Yes   Plan:  Lovenox 50mg  SQ Q12H CBC Q72H  Eufelia Veno, 11/25/2016,5:40 PM

## 2016-11-25 NOTE — Progress Notes (Signed)
VASCULAR LAB PRELIMINARY  PRELIMINARY  PRELIMINARY  PRELIMINARY  Bilateral lower extremity venous duplex completed.    Preliminary report:  There is no DVT or SVT noted in the bilateral lower extremities.  Patient also had negative study done 10/27/16.  Dedee Liss, RVT 11/25/2016, 9:58 AM

## 2016-11-25 NOTE — Progress Notes (Signed)
Nuclear stress test showed normal LVF with no ischemia and septal HK.  No further cardiac workup indicated at this time.  Will sign off.  Call with any questions.

## 2016-11-25 NOTE — Discharge Summary (Addendum)
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 nonspecific ST changes noticed on previous tracings. She was transferred to [facility] her internal medicine hospital admission.  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 Sherren Mocha on 12/12 for PFO closure Plan was to start Plavix prior to PFO closure Patient has not had any ischemic workup consulted cardiology  to see if patient needs to have ischemic workup for her chest pain this admission Continue aspirin, statin Lexiscan myocardial perfusion imaging,done 11/25/16 1. No significant reversible ischemia or  infarction.  2. Septal hypokinesis.  3. 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 We will have TEE on Monday to further confirm.  RUE and BLE venous doppler -Positive for chronic appearing thrombus in a short segment of the cephalic vein in the upper arm.Negative for DVT. Lower extremity DVT negative  EKG - sinus tach  TEE to look for embolic source.positive for PFO. Cardiology Dr Burt Knack  was notified  LDL33  HgbA1c4.7  UDS positive for opiates and benzodiazepines  Hypercoagulable work up negative(anti--DNA, ANA, cardiolipin antibody, homocystine[except this was mildly elevated], beta 2 glycoprotein, lupus anticoagulant, are all negative-CRP, ESR, HIV, RPR are all negative)  VitB12, TSH 0.85  EEG unremarkable  Continue aspirin 325 mg as recommended by neurology,per dr cooper start Plavix prior to PFO closure   Impacted fracture of the right shoulder,Right glenoid fx Orthopedics consult requested-Dr. 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 rate 20, height 5' (1.524 m), weight 49.4 kg (108 lb 14.4 oz), SpO2 100 %. General exam: Appears calm and comfortable  Respiratory system: Clear to auscultation. Respiratory effort normal. Cardiovascular system: S1 & S2 heard, RRR. No JVD, murmurs, rubs, gallops or clicks. No pedal edema. Gastrointestinal system: Abdomen is nondistended, soft and nontender. No organomegaly or masses felt. Normal bowel sounds heard. Central nervous system: Alert and oriented. No focal neurological deficits. Extremities: Symmetric 5 x 5 power. Skin: No rashes, lesions or ulcers Psychiatry: Judgement and insight appear normal. Mood & affect appropriate.       Follow-up Information    Sandi Mariscal, MD. Call.   Specialty:  Internal Medicine Why:  Hospital follow-up follow, see PCP in 3-5 days Contact information: [place] [address] [phone]        Sherren Mocha, MD. Call.   Specialty:  Cardiology Why:  To make appointment for PFO closure with cardiology Contact information: [address] [place] [address] [phone]           Signed: Reyne Dumas 11/25/2016, 8:06 AM        Time spent >45 mins
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
## 2016-11-26 ENCOUNTER — Inpatient Hospital Stay (HOSPITAL_COMMUNITY): Payer: Medicaid Other

## 2016-11-26 DIAGNOSIS — M5440 Lumbago with sciatica, unspecified side: Secondary | ICD-10-CM

## 2016-11-26 DIAGNOSIS — G8929 Other chronic pain: Secondary | ICD-10-CM

## 2016-11-26 LAB — BLOOD GAS, ARTERIAL
ACID-BASE DEFICIT: 5.4 mmol/L — AB (ref 0.0–2.0)
Bicarbonate: 18.2 mmol/L — ABNORMAL LOW (ref 20.0–28.0)
Drawn by: 406621
FIO2: 21
O2 SAT: 90.8 %
PCO2 ART: 28.4 mmHg — AB (ref 32.0–48.0)
PO2 ART: 61.1 mmHg — AB (ref 83.0–108.0)
Patient temperature: 98.6
pH, Arterial: 7.422 (ref 7.350–7.450)

## 2016-11-26 LAB — URINALYSIS, ROUTINE W REFLEX MICROSCOPIC
Bacteria, UA: NONE SEEN
Bilirubin Urine: NEGATIVE
GLUCOSE, UA: NEGATIVE mg/dL
HGB URINE DIPSTICK: NEGATIVE
KETONES UR: NEGATIVE mg/dL
Nitrite: NEGATIVE
PROTEIN: NEGATIVE mg/dL
Specific Gravity, Urine: 1.011 (ref 1.005–1.030)
pH: 6 (ref 5.0–8.0)

## 2016-11-26 LAB — GLUCOSE, CAPILLARY
GLUCOSE-CAPILLARY: 157 mg/dL — AB (ref 65–99)
Glucose-Capillary: 166 mg/dL — ABNORMAL HIGH (ref 65–99)
Glucose-Capillary: 207 mg/dL — ABNORMAL HIGH (ref 65–99)
Glucose-Capillary: 167 mg/dL — ABNORMAL HIGH (ref 65–99)

## 2016-11-26 LAB — CBC
HCT: 30.8 % — ABNORMAL LOW (ref 36.0–46.0)
HEMOGLOBIN: 10.1 g/dL — AB (ref 12.0–15.0)
MCH: 25.4 pg — AB (ref 26.0–34.0)
MCHC: 32.8 g/dL (ref 30.0–36.0)
MCV: 77.6 fL — ABNORMAL LOW (ref 78.0–100.0)
Platelets: 167 10*3/uL (ref 150–400)
RBC: 3.97 MIL/uL (ref 3.87–5.11)
RDW: 14.9 % (ref 11.5–15.5)
WBC: 14.2 10*3/uL — ABNORMAL HIGH (ref 4.0–10.5)

## 2016-11-26 LAB — PROCALCITONIN: Procalcitonin: 0.13 ng/mL

## 2016-11-26 MED ORDER — PIPERACILLIN-TAZOBACTAM 3.375 G IVPB
3.3750 g | Freq: Three times a day (TID) | INTRAVENOUS | Status: DC
  Administered 2016-11-26 – 2016-11-27 (×2): 3.375 g via INTRAVENOUS
  Filled 2016-11-26 (×4): qty 50

## 2016-11-26 MED ORDER — DIAZEPAM 5 MG PO TABS: 2.5000 mg | ORAL_TABLET | Freq: Two times a day (BID) | ORAL | Status: DC

## 2016-11-26 MED ORDER — SODIUM CHLORIDE 0.9 % IV SOLN
INTRAVENOUS | Status: AC
  Administered 2016-11-26: 12:00:00 via INTRAVENOUS

## 2016-11-26 MED ORDER — DIAZEPAM 5 MG/ML IJ SOLN
2.5000 mg | Freq: Two times a day (BID) | INTRAMUSCULAR | Status: DC | PRN
  Administered 2016-11-26: 2.5 mg via INTRAVENOUS
  Filled 2016-11-26: qty 2

## 2016-11-26 MED ORDER — PIPERACILLIN-TAZOBACTAM 3.375 G IVPB 30 MIN: 3.3750 g | Freq: Three times a day (TID) | INTRAVENOUS | Status: DC

## 2016-11-26 MED ORDER — PIPERACILLIN-TAZOBACTAM 3.375 G IVPB 30 MIN
3.3750 g | Freq: Once | INTRAVENOUS | Status: AC
  Administered 2016-11-26: 3.375 g via INTRAVENOUS
  Filled 2016-11-26: qty 50

## 2016-11-26 NOTE — Progress Notes (Signed)
Patient seems to be more confused, very sleepy, trying to eat a washcloth. Unable to feed herself this morning.  Son is at bedside and he states this is not his mothers normal behavior. She is normally self sufficient and very oriented. Have paged attending and waiting for call back.

## 2016-11-26 NOTE — Progress Notes (Signed)
SLP Cancellation Note  Patient Details Name: Amber [name] MRN: [id number] DOB: 11/22/1955   Cancelled treatment:       Reason Eval/Treat Not Completed: Fatigue/lethargy limiting ability to participate.  Per nursing the patient was given valium and she is barely rousable.  Stat head CT has been ordered.  ST will follow up next date to re attempt swallowing evaluation.    Dimas Aguas, MA, CCC-SLP Acute Rehab SLP [phone] [place]ras 11/26/2016, 2:07 PM

## 2016-11-26 NOTE — Progress Notes (Addendum)
[facility] PROGRESS NOTE  Lorrinda Ramstad BZJ:[id number] DOB: 1956-02-09 DOA: 11/23/2016   PCP: Sandi Mariscal, MD     Assessment/Plan: Principal Problem:   Chest pain Active Problems:   History of seizures   Asthma   Diabetes mellitus type 2, controlled ([place])   Chronic back pain   Frequent falls   Essential hypertension   Pulmonary hypertension   Shoulder pain, right   CVA (cerebral vascular accident) [facility])  61 year old female with a history of recent embolic CVA, positive TCD bubble study recently, history of seizure disorder, hypertension, non-insulin-dependent diabetes mellitus, asthma, discharged to SNF on 10/31/16, presents to the ER at [facility] because of onset of chest pain and headache last night that reminded her of her recent stroke. Patient described left-sided chest pain radiating into the left neck associated with shortness of breath. Symptoms lasted about 30 minutes. Patient states that the headache reminded her of her recent symptoms when she was diagnosed with a stroke. She also reports a nonproductive cough for the last couple of days. No fever. As a part of a stroke workup patient had  TCD which was positive followed by  TEE which showed a PFO. She was supposed to follow-up with Dr. Burt Knack for discussing PFO closure. She had a VQ scan 12/8 during her admission without evidence of PE.  She also has recent glenoid fracture of the right shoulder which was being managed conservatively Patient was discharged to SNF where she stayed about 4 or 5 days prior to returning home with her daughter, as she did not like her SNF  ED course-patient was found to be normotensive, nontoxic-appearing, initial workup revealed potassium of 2.9, magnesium 1.2, troponin 0.07, elevated d-dimer. EKG shows a sinus tachycardia with some nonspecific ST changes. No evidence of STEMI on EKG. No significant changes from her last EKG   admitted for chest pain, stress test negative  this admission  Assessment and plan    Chest pain-heart score of 4 Fairly atypical, previous troponins have been slightly Elevated Troponin this admission 0.073 D-dimer abnormal but also chronically elevated Recent VQ scan negative, chronic thrombus in the cephalic vein of the upper extremity during her last admission Low threshold to suspect PE, however given new tachycardia and fever yesterday will order a VQ scan to rule out PE Venous Doppler negative this admission Recently seen by Dr. Sherren Mocha on 12/12 for PFO closure Plan was to start Plavix prior to PFO closure Patient admitted for ischemic workup Nuclear stress test showed normal LVF with no ischemia and septal HK.  No further cardiac workup  Continue aspirin, statin   Recent Embolic CVA Multiple bilateral anterior and supratentorial infarcts, embolic secondary to unknown source  MRInumerous supra and infratentorial bilateral embolic infarcts, largest at right MCA frontal cortical region  MRAhead and neckunremarkable  2D Echo-LV EF: 60% - 65%  TCD with bubble study - Transcranial Doppler Bubble Study indicative of a moderateright to left intracardiac shunt. We will have TEE on Monday to further confirm.  RUE and BLE venous doppler -Positive for chronic appearing thrombus in a short segment of the cephalic vein in the upper arm.Negative for DVT. Lower extremity DVT negative  EKG - sinus tach  TEE to look for embolic source.positive for PFO. Cardiology Dr Burt Knack  was notified  LDL33  HgbA1c4.7  UDS positive for opiates and benzodiazepines  Hypercoagulable work up negative(anti--DNA, ANA, cardiolipin antibody, homocystine[except this was mildly elevated], beta 2 glycoprotein, lupus  anticoagulant, are all negative-CRP, ESR, HIV, RPR are all negative)  VitB12, TSH 0.85  EEG unremarkable  Continue aspirin 325 mg as recommended by neurology,per dr cooper start Plavix prior to PFO  closure   Impacted fracture of the right shoulder,Right glenoid fx Orthopedics consult requested-Dr. Margarito Liner mx  Patient would need outpatient follow-up  Seizure Neurology continues to have concerns for seizure activity EEG diffuse cerebral dysfunction that is non-specific in etiology  Currently on Vimpat   HTN: -Permissive hypertension for now, resumemetoprolol and furosemide/K -Continue statin  NIDDM: Discontinued home oral hypoglycemics during her last admission, hemoglobin A1c 4.7,    Chronic pain Patient takes multiple narcotic medications at home  Oxymorphone,   Asthma: Without exacerbation, continue   Dulera  Anxiety-patient takes Valium at home  Minimize sedating and narcotic medications  Elevated d-dimer Patient is tachycardic, therefore we'll order a VQ scan to rule out PE  Acute encephalopathy, in the setting of fever Awake but confused Chest x-ray shows possible atelectasis/pneumonia in the right lower lobe Will empirically start the patient on Zosyn Blood culture 2 Check UA Check pro-calcitonin, blood culture 2 CT of the head without contrast due to recent stroke ABG        DVT prophylaxsis Lovenox  Code Status:  Full code     Family Communication: Discussed in detail with the patient, all imaging results, lab results explained to the patient   Disposition Plan: Discussed with the patient's son, he would like to take her home and the patient is ready for discharge      Cardiology to consult  Procedures:  None  Antibiotics: Anti-infectives    None         HPI/Subjective: Tachycardic with a low-grade fever last night, Confused   Objective: Vitals:   11/25/16 1159 11/25/16 1953 11/26/16 0500 11/26/16 0739  BP: 122/65 (!) 113/55 116/78   Pulse: (!) 136 (!) 112 (!) 117   Resp: (!) 24 (!) 22 20   Temp:  99.8 F (37.7 C) (!) 100.7 F (38.2 C)   TempSrc:  Oral Oral   SpO2:  100% 100% 94%   Weight:      Height:        Intake/Output Summary (Last 24 hours) at 11/26/16 0816 Last data filed at 11/26/16 0500  Gross per 24 hour  Intake              450 ml  Output                0 ml  Net              450 ml    Exam:  Examination:  General exam: Appears calm and comfortable  Respiratory system: Clear to auscultation. Respiratory effort normal. Cardiovascular system: S1 & S2 heard, RRR. No JVD, murmurs, rubs, gallops or clicks. No pedal edema. Gastrointestinal system: Abdomen is nondistended, soft and nontender. No organomegaly or masses felt. Normal bowel sounds heard. Central nervous system: Confused. No focal neurological deficits. Extremities: Symmetric 5 x 5 power. Skin: No rashes, lesions or ulcers Psychiatry: Judgement and insight appear normal. Mood & affect appropriate.     Data Reviewed: I have personally reviewed following labs and imaging studies  Micro Results No results found for this or any previous visit (from the past 240 hour(s)).  Radiology Reports Dg Chest 2 View  Result Date: 11/23/2016 CLINICAL DATA:  Onset of chest pain last night. CVA 2 weeks ago. History of asthma, former smoker. EXAM: CHEST  2 VIEW COMPARISON:  PA and lateral chest x-ray of October 27, 2016 FINDINGS: The lungs are mildly hyperinflated with hemidiaphragm flattening. There is no significant pleural effusion. The cardiac silhouette is top-normal in size. The pulmonary vascularity is normal. The patient has undergone previous upper in mid posterior thoracic spinal fusion. There old deformities of the lateral aspects of the right third and fourth ribs. IMPRESSION: There is no acute pneumonia nor CHF. Chronic bronchitic changes, stable. Electronically Signed   By: David  [place] M.D.   On: 11/23/2016 12:34   Dg Chest 2 View  Result Date: 10/27/2016 CLINICAL DATA:  Stroke 2 days ago EXAM: CHEST  2 VIEW COMPARISON:  08/22/2016 FINDINGS: Cardiomegaly again noted. No infiltrate or pleural  effusion. No pulmonary edema. Stable left basilar scarring. Metallic fixation rods thoracic spine again noted. Degenerative changes bilateral shoulders. IMPRESSION: No active cardiopulmonary disease. Cardiomegaly again noted. Stable left basilar scarring. Electronically Signed   By: Natasha Mead M.D.   On: 10/27/2016 16:44   Dg Shoulder Right  Result Date: 11/23/2016 CLINICAL DATA:  Right shoulder pain since a fall 2 weeks ago. Subsequent encounter. EXAM: RIGHT SHOULDER - 2+ VIEW COMPARISON:  Plain films of the right shoulder 10/26/2016 and 05/19/2016. FINDINGS: No fracture is identified. The humeral head is high-riding. 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 The humerus is located. Calcifications projecting along the posterior margin of the humeral head on the Y-view may be due to calcific rotator cuff tendinopathy. Spinal fusion hardware is noted IMPRESSION: No fracture is identified on today's examination. High-riding humeral head compatible with rotator cuff tear. Acromioclavicular osteoarthritis. Electronically Signed   By: Drusilla Kanner M.D.   On: 11/23/2016 13:22   Nm Myocar Multi W/spect W/wall Motion / Ef  Result Date: 11/25/2016 CLINICAL DATA:  Chest pain EXAM: MYOCARDIAL IMAGING WITH SPECT (REST AND PHARMACOLOGIC-STRESS) GATED LEFT VENTRICULAR WALL MOTION STUDY LEFT VENTRICULAR EJECTION FRACTION TECHNIQUE: Standard myocardial SPECT imaging was performed after resting intravenous injection of 10 mCi Tc-49m tetrofosmin. Subsequently, intravenous infusion of Lexiscan was performed under the supervision of the Cardiology staff. At peak effect of the drug, 30 mCi Tc-48m tetrofosmin was injected intravenously and standard myocardial SPECT imaging was performed. Quantitative gated imaging was also performed to evaluate left ventricular wall motion, and estimate left ventricular ejection fraction. COMPARISON:  None. FINDINGS: Perfusion: No significant decreased activity in the left ventricle on stress  imaging to suggest reversible ischemia or infarction. Wall Motion: Septal hypokinesis noted.  No LV chamber dilatation. Left Ventricular Ejection Fraction: 71 % End diastolic volume 20 ml End systolic volume 6 ml IMPRESSION: 1. No significant reversible ischemia or infarction. 2. Septal hypokinesis. 3. Left ventricular ejection fraction 71% 4. Non invasive risk stratification*: Low *2012 Appropriate Use Criteria for Coronary Revascularization Focused Update: J Am Coll Cardiol. 2012;59(9):857-881. http://content.dementiazones.com.aspx?articleid=[email or web address] Electronically Signed   By: Judie Petit.  Shick M.D.   On: 11/25/2016 13:18   Nm Pulmonary Perf And Vent  Result Date: 10/27/2016 CLINICAL DATA:  Shortness of breath. EXAM: NUCLEAR MEDICINE VENTILATION - PERFUSION LUNG SCAN TECHNIQUE: Ventilation images were obtained in multiple projections using inhaled aerosol Tc-27m DTPA. Perfusion images were obtained in multiple projections after intravenous injection of Tc-85m MAA. RADIOPHARMACEUTICALS:  31.6 mCi Technetium-78m DTPA aerosol inhalation and 4.38 mCi Technetium-26m MAA IV COMPARISON:  Chest x-ray today and chest CT 05/04/2016 FINDINGS: Ventilation: No focal ventilation defect. Perfusion: No wedge shaped peripheral perfusion defects to suggest acute pulmonary embolism. IMPRESSION: Normal VQ scan without evidence of pulmonary embolism. Electronically Signed   By: Reuel Boom  Derrel Nip M.D.   On: 10/27/2016 16:59     CBC  Recent Labs Lab 11/23/16 1248 11/23/16 1703 11/24/16 0320 11/26/16 0449  WBC 8.9 9.3 9.7 14.2*  HGB 10.0* 9.1* 9.8* 10.1*  HCT 30.3* 27.9* 30.0* 30.8*  PLT 205 190 174 167  MCV 76.9* 77.3* 76.9* 77.6*  MCH 25.4* 25.2* 25.1* 25.4*  MCHC 33.0 32.6 32.7 32.8  RDW 15.0 14.9 15.1 14.9  LYMPHSABS 2.9  --   --   --   MONOABS 1.0  --   --   --   EOSABS 0.0  --   --   --   BASOSABS 0.0  --   --   --     Chemistries   Recent Labs Lab 11/23/16 1248 11/23/16 1703 11/24/16 0320  NA 144  --   143  K 2.9*  --  5.4*  CL 114*  --  113*  CO2 21*  --  24  GLUCOSE 118*  --  91  BUN 16  --  14  CREATININE 0.89 0.84 0.98  CALCIUM 8.5*  --  8.5*  MG 1.2*  --  2.3  AST  --   --  27  ALT  --   --  26  ALKPHOS  --   --  102  BILITOT  --   --  0.5   ------------------------------------------------------------------------------------------------------------------ estimated creatinine clearance is 44.9 mL/min (by C-G formula based on SCr of 0.98 mg/dL). ------------------------------------------------------------------------------------------------------------------ No results for input(s): HGBA1C in the last 72 hours. ------------------------------------------------------------------------------------------------------------------ No results for input(s): CHOL, HDL, LDLCALC, TRIG, CHOLHDL, LDLDIRECT in the last 72 hours. ------------------------------------------------------------------------------------------------------------------ No results for input(s): TSH, T4TOTAL, T3FREE, THYROIDAB in the last 72 hours.  Invalid input(s): FREET3 ------------------------------------------------------------------------------------------------------------------ No results for input(s): VITAMINB12, FOLATE, FERRITIN, TIBC, IRON, RETICCTPCT in the last 72 hours.  Coagulation profile No results for input(s): INR, PROTIME in the last 168 hours.   Recent Labs  11/23/16 1703  DDIMER 2.59*    Cardiac Enzymes  Recent Labs Lab 11/23/16 1703 11/23/16 2021 11/23/16 2231  TROPONINI 0.07* 0.07* 0.06*   ------------------------------------------------------------------------------------------------------------------ Invalid input(s): POCBNP   CBG:  Recent Labs Lab 11/25/16 0622 11/25/16 1310 11/25/16 1720 11/25/16 2105 11/26/16 0622  GLUCAP 123* 155* 216* 202* 157*       Studies: Nm Myocar Multi W/spect W/wall Motion / Ef  Result Date: 11/25/2016 CLINICAL DATA:  Chest pain EXAM:  MYOCARDIAL IMAGING WITH SPECT (REST AND PHARMACOLOGIC-STRESS) GATED LEFT VENTRICULAR WALL MOTION STUDY LEFT VENTRICULAR EJECTION FRACTION TECHNIQUE: Standard myocardial SPECT imaging was performed after resting intravenous injection of 10 mCi Tc-61mtetrofosmin. Subsequently, intravenous infusion of Lexiscan was performed under the supervision of the Cardiology staff. At peak effect of the drug, 30 mCi Tc-971metrofosmin was injected intravenously and standard myocardial SPECT imaging was performed. Quantitative gated imaging was also performed to evaluate left ventricular wall motion, and estimate left ventricular ejection fraction. COMPARISON:  None. FINDINGS: Perfusion: No significant decreased activity in the left ventricle on stress imaging to suggest reversible ischemia or infarction. Wall Motion: Septal hypokinesis noted.  No LV chamber dilatation. Left Ventricular Ejection Fraction: 71 % End diastolic volume 20 ml End systolic volume 6 ml IMPRESSION: 1. No significant reversible ischemia or infarction. 2. Septal hypokinesis. 3. Left ventricular ejection fraction 71% 4. Non invasive risk stratification*: Low *2012 Appropriate Use Criteria for Coronary Revascularization Focused Update: J Am Coll Cardiol. 203903;00(9):233-007http://content.onairportbarriers.comspx?articleid=[email or web address] Electronically Signed   By: M.Jerilynn Mages Shick M.D.   On: 11/25/2016 13:18  Lab Results  Component Value Date   HGBA1C 4.7 (L) 10/27/2016   HGBA1C 5.5 02/29/2016   HGBA1C 5.8 (H) 11/07/2015   Lab Results  Component Value Date   LDLCALC 33 10/27/2016   CREATININE 0.98 11/24/2016       Scheduled Meds: . aspirin EC  325 mg Oral Daily  . atorvastatin  10 mg Oral Daily  . enoxaparin (LOVENOX) injection  50 mg Subcutaneous Q12H  . feeding supplement (ENSURE ENLIVE)  237 mL Oral BID BM  . folic acid  1 mg Oral Daily  . gabapentin  300 mg Oral BID  . insulin aspart  0-9 Units Subcutaneous TID WC  . lacosamide  100 mg  Oral BID  . metoprolol succinate  50 mg Oral Daily  . mometasone-formoterol  2 puff Inhalation BID  . pantoprazole  40 mg Oral BID AC  . polyethylene glycol  17 g Oral BID  . predniSONE  15 mg Oral Q breakfast   Continuous Infusions:   LOS: 1 day    Time spent: >30 MINS    [facility]  [facility] Pager [phone]. If 7PM-7AM, please contact night-coverage at [email or web address], password [facility] 11/26/2016, 8:16 AM  LOS: 1 day

## 2016-11-26 NOTE — Progress Notes (Signed)
OT Cancellation Note  Patient Details Name: Amber [name] MRN: [id number] DOB: September 11, 1956   Cancelled Treatment:    Reason Eval/Treat Not Completed: Other (comment). Per chart review and conversation with RN, pt with increased confusion this morning. STAT CT ordered and valium administered to pt with pt now lethargic. Will hold OT eval until after CT scan has been completed and pt more alert.   Raynald Kemp OTR/L Pager: [phone]   11/26/2016, 3:19 PM

## 2016-11-26 NOTE — Progress Notes (Addendum)
Pharmacy Antibiotic Note  Rochele Lueck is a 61 y.o. female admitted on 11/23/2016 withpneumonia? Pharmacy has been consulted for zosyn dosing. Tmax/24h 100.7, wbc up 14.2. LA trend down, PCT 0.53. CrCl~45.  Plan: Zosyn 3.375g IV ( inf) x1; then 3.375g IV q8h (4h inf) Monitor clinical progress, c/s, renal function, abx plan/LOT  Height: 5' (152.4 cm) Weight: 108 lb 14.4 oz (49.4 kg) IBW/kg (Calculated) : 45.5  Temp (24hrs), Avg:100.3 F (37.9 C), Min:99.8 F (37.7 C), Max:100.7 F (38.2 C)   Recent Labs Lab 11/23/16 1248 11/23/16 1703 11/24/16 0320 11/26/16 0449  WBC 8.9 9.3 9.7 14.2*  CREATININE 0.89 0.84 0.98  --     Estimated Creatinine Clearance: 44.9 mL/min (by C-G formula based on SCr of 0.98 mg/dL).    Allergies  Allergen Reactions  . Ivp Dye [Iodinated Diagnostic Agents] Itching    Babs Bertin, PharmD, BCPS Clinical Pharmacist 11/26/2016 11:47 AM

## 2016-11-27 ENCOUNTER — Inpatient Hospital Stay (HOSPITAL_COMMUNITY): Payer: Medicaid Other

## 2016-11-27 DIAGNOSIS — R4182 Altered mental status, unspecified: Secondary | ICD-10-CM

## 2016-11-27 DIAGNOSIS — R400 Somnolence: Secondary | ICD-10-CM

## 2016-11-27 LAB — COMPREHENSIVE METABOLIC PANEL
ALT: 27 U/L (ref 14–54)
AST: 30 U/L (ref 15–41)
Albumin: 2.2 g/dL — ABNORMAL LOW (ref 3.5–5.0)
Alkaline Phosphatase: 100 U/L (ref 38–126)
Anion gap: 8 (ref 5–15)
BUN: 23 mg/dL — AB (ref 6–20)
CHLORIDE: 111 mmol/L (ref 101–111)
CO2: 19 mmol/L — AB (ref 22–32)
CREATININE: 1 mg/dL (ref 0.44–1.00)
Calcium: 8.5 mg/dL — ABNORMAL LOW (ref 8.9–10.3)
GFR calc Af Amer: >60 mL/min (ref >60)
Glucose, Bld: 167 mg/dL — ABNORMAL HIGH (ref 65–99)
Potassium: 3.8 mmol/L (ref 3.5–5.1)
SODIUM: 138 mmol/L (ref 135–145)
Total Bilirubin: 0.4 mg/dL (ref 0.3–1.2)
Total Protein: 5.1 g/dL — ABNORMAL LOW (ref 6.5–8.1)

## 2016-11-27 LAB — CBC
HCT: 27.6 % — ABNORMAL LOW (ref 36.0–46.0)
Hemoglobin: 9 g/dL — ABNORMAL LOW (ref 12.0–15.0)
MCH: 25.5 pg — ABNORMAL LOW (ref 26.0–34.0)
MCHC: 32.6 g/dL (ref 30.0–36.0)
MCV: 78.2 fL (ref 78.0–100.0)
PLATELETS: 132 10*3/uL — AB (ref 150–400)
RBC: 3.53 MIL/uL — ABNORMAL LOW (ref 3.87–5.11)
RDW: 15.2 % (ref 11.5–15.5)
WBC: 10.1 10*3/uL (ref 4.0–10.5)

## 2016-11-27 LAB — GLUCOSE, CAPILLARY
Glucose-Capillary: 219 mg/dL — ABNORMAL HIGH (ref 65–99)
Glucose-Capillary: 111 mg/dL — ABNORMAL HIGH (ref 65–99)

## 2016-11-27 MED ORDER — BISACODYL 10 MG RE SUPP: 10.0000 mg | Freq: Once | RECTAL | Status: DC

## 2016-11-27 MED ORDER — TECHNETIUM TO 99M ALBUMIN AGGREGATED
4.2000 | Freq: Once | INTRAVENOUS | Status: AC | PRN
  Administered 2016-11-27: 4.2 via INTRAVENOUS

## 2016-11-27 MED ORDER — POLYETHYLENE GLYCOL 3350 17 G PO PACK: 17.0000 g | PACK | Freq: Two times a day (BID) | ORAL | Status: DC

## 2016-11-27 MED ORDER — AMOXICILLIN-POT CLAVULANATE 875-125 MG PO TABS: 1.0000 | ORAL_TABLET | Freq: Two times a day (BID) | ORAL | 0 refills | Status: AC

## 2016-11-27 MED ORDER — TECHNETIUM TC 99M DIETHYLENETRIAME-PENTAACETIC ACID: 31.3000 | Freq: Once | INTRAVENOUS | Status: DC | PRN

## 2016-11-27 NOTE — Evaluation (Signed)
Clinical/Bedside Swallow Evaluation Patient Details  Name: Amber [name] MRN: [id number] Date of Birth: 1956/06/17  Today's Date: 11/27/2016 Time: SLP Start Time (ACUTE ONLY): 1400 SLP Stop Time (ACUTE ONLY): 1430 SLP Time Calculation (min) (ACUTE ONLY): 30 min  Past Medical History:  Past Medical History:  Diagnosis Date  . Arthritis   . Asthma   . Chronic pain   . Constipation   . Diabetes mellitus without complication (HCC)   . Embolic stroke (HCC)   . Falls frequently   . GERD (gastroesophageal reflux disease)   . Hypertension   . Pneumonia 10/2015  . Seizures (HCC)    last seizure March 2015   Past Surgical History:  Past Surgical History:  Procedure Laterality Date  . APPENDECTOMY    . BACK SURGERY    . CHOLECYSTECTOMY    . TEE WITHOUT CARDIOVERSION N/A 10/30/2016   Procedure: TRANSESOPHAGEAL ECHOCARDIOGRAM (TEE);  Surgeon: Lars Masson, MD;  Location: [facility] ENDOSCOPY;  Service: Cardiovascular;  Laterality: N/A;   HPI:  Pt adm with chest pain, suspected aspiration PNA (RLL).  Pt with recent CVA and rt shoulder fx in Dec 2017. PMH - seizures, HTN, frequent falls, GERD. Patient had a bedside swallow evaluation complete 02/2016 with recommendations for dysphagia 2, thin liquids.    Assessment / Plan / Recommendation Clinical Impression  Patient presents with a functional oropharyngeal swallow without overt s/s of aspiration. Oral phase mildly delayed given missing dentition but efficient. Patient however, reports intermittent coughing with pos, hoarse vocal quality with worsening since most recent CVA in 10/2016, and h/o recurrent PNAs (diagnosed approximately every 6 months per patient's daughter), as well as a h/o GER which may be contributing to difficulty overall. Recommend MBS to more objectively evaluate function and r/o silent aspiration. Patient with plans to d/c this pm. MBS has been scheduled as an OP for 12/08/15 at 11:30, daughter agreeable to plan and aware of  appointment.     Aspiration Risk  Mild aspiration risk    Diet Recommendation Regular;Thin liquid   Liquid Administration via: Cup;Straw Medication Administration: Whole meds with liquid Supervision: Patient able to self feed Compensations: Slow rate;Small sips/bites Postural Changes: Seated upright at 90 degrees;Remain upright for at least 30 minutes after po intake    Other  Recommendations Oral Care Recommendations: Oral care BID   Follow up Recommendations  (OP MBS )        Swallow Study   General HPI: Pt adm with chest pain, suspected aspiration PNA ([facility]LL).  Pt with recent CVA and rt shoulder fx in Dec 2017. PMH - seizures, HTN, frequent falls, GERD. Patient had a bedside swallow evaluation complete 02/2016 with recommendations for dysphagia 2, thin liquids.  Type of Study: Bedside Swallow Evaluation Previous Swallow Assessment: see HPI Diet Prior to this Study: Regular;Thin liquids Temperature Spikes Noted: No Respiratory Status: Room air History of Recent Intubation: No Behavior/Cognition: Alert;Cooperative;Pleasant mood Oral Cavity Assessment: Within Functional Limits Oral Care Completed by SLP: No Oral Cavity - Dentition: Edentulous Vision: Functional for self-feeding Self-Feeding Abilities: Able to feed self Patient Positioning: Upright in chair Baseline Vocal Quality: Hoarse Volitional Cough: Strong Volitional Swallow: Able to elicit    Oral/Motor/Sensory Function Overall Oral Motor/Sensory Function: Mild impairment Facial ROM: Reduced right;Suspected CN VII (facial) dysfunction Facial Symmetry: Abnormal symmetry right Facial Strength: Reduced right Facial Sensation: Within Functional Limits Lingual ROM: Within Functional Limits Lingual Symmetry: Abnormal symmetry right Lingual Strength: Within Functional Limits Lingual Sensation: Within Functional Limits Velum: Within Functional Limits Mandible:  Within Functional Limits   Ice Chips Ice chips: Not tested    Thin Liquid Thin Liquid: Within functional limits Presentation: Cup;Self Fed;Straw    Nectar Thick Nectar Thick Liquid: Not tested   Honey Thick Honey Thick Liquid: Not tested   Puree Puree: Not tested   Solid   GO   Solid: Within functional limits Presentation: Self Fed       Skyleigh Windle MA, CCC-SLP [phone]  Sylvio Weatherall Meryl 11/27/2016,2:41 PM

## 2016-11-27 NOTE — Discharge Summary (Signed)
**Note Amber-Identified via Obfuscation** Physician Discharge Summary  Amber [name] MRN: [id number] DOB/AGE: Feb 19, 1956 61 y.o.  PCP: Sandi Mariscal, MD   Admit date: 11/23/2016 Discharge date: 11/27/2016  Discharge Diagnoses:    Principal Problem:   Chest pain Active Problems:   History of seizures   Asthma   Diabetes mellitus type 2, controlled (Amber [name])   Chronic back pain   Frequent falls   Essential hypertension   Pulmonary hypertension   Shoulder pain, right   CVA (cerebral vascular accident) (Amber [name])   Altered mental status    Follow-up recommendations Follow-up with PCP in 3-5 days , including all  additional recommended appointments as below Follow-up CBC, CMP in 3-5 days       Current Discharge Medication List    START taking these medications   Details  amoxicillin-clavulanate (AUGMENTIN) 875-125 MG tablet Take 1 tablet by mouth 2 (two) times daily. Qty: 10 tablet, Refills: 0    Magnesium Oxide 400 MG CAPS Take 1 capsule (400 mg total) by mouth 2 (two) times daily. Qty: 60 each, Refills: 0    potassium chloride 20 MEQ TBCR Take 40 mEq by mouth daily. Qty: 30 tablet, Refills: 1      CONTINUE these medications which have CHANGED   Details  metoprolol succinate (TOPROL-XL) 50 MG 24 hr tablet Take 1 tablet (50 mg total) by mouth daily. Qty: 30 tablet, Refills: 3      CONTINUE these medications which have NOT CHANGED   Details  albuterol (PROVENTIL HFA;VENTOLIN HFA) 108 (90 BASE) MCG/ACT inhaler Inhale 2 puffs into the lungs every 6 (six) hours as needed for wheezing or shortness of breath.    allopurinol (ZYLOPRIM) 300 MG tablet Take 300 mg by mouth daily. Refills: 11    amLODipine (NORVASC) 5 MG tablet Take 5 mg by mouth daily.    aspirin EC 325 MG EC tablet Take 1 tablet (325 mg total) by mouth daily. Qty: 30 tablet, Refills: 0    atorvastatin (LIPITOR) 10 MG tablet Take 10 mg by mouth daily. Refills: 5    diazepam (VALIUM) 5 MG tablet Take 0.5 tablets (2.5 mg total) by mouth 2 (two) times  daily. Qty: 14 tablet, Refills: 0    feeding supplement, ENSURE ENLIVE, (ENSURE ENLIVE) LIQD Take 237 mLs by mouth 2 (two) times daily between meals. Qty: 60 Bottle, Refills: 0    Fluticasone-Salmeterol (ADVAIR) 250-50 MCG/DOSE AEPB Inhale 1 puff into the lungs 2 (two) times daily.    folic acid (FOLVITE) 1 MG tablet Take 1 mg by mouth daily.    gabapentin (NEURONTIN) 300 MG capsule Take 1 capsule (300 mg total) by mouth 2 (two) times daily. Qty: 60 capsule, Refills: 0    lacosamide 100 MG TABS Take 1 tablet (100 mg total) by mouth 2 (two) times daily. Qty: 60 tablet, Refills: 2    lactulose (CHRONULAC) 10 GM/15ML solution Take 30 mLs (20 g total) by mouth 2 (two) times daily as needed for severe constipation. Qty: 240 mL, Refills: 0    LINZESS 145 MCG CAPS capsule Take 1 capsule (145 mcg total) by mouth daily as needed (constipation). Qty: 30 capsule, Refills: 0    oxyCODONE (OXY IR/ROXICODONE) 5 MG immediate release tablet Take 20 mg by mouth 4 (four) times daily as needed for severe pain.     pantoprazole (PROTONIX) 40 MG tablet Take 1 tablet (40 mg total) by mouth 2 (two) times daily before a meal. Qty: 60 tablet, Refills: 3    polyethylene glycol (MIRALAX / GLYCOLAX) packet  Take 17 g by mouth 2 (two) times daily.    predniSONE (DELTASONE) 10 MG tablet Take 15 mg by mouth daily with breakfast.    promethazine (PHENERGAN) 25 MG tablet Take 1 tablet by mouth every 6 (six) hours as needed for nausea or vomiting. nausea Refills: 0    torsemide (DEMADEX) 10 MG tablet Take 10 mg by mouth daily.    Vitamin D, Ergocalciferol, (DRISDOL) 50000 UNITS CAPS capsule Take 1 capsule (50,000 Units total) by mouth every 7 (seven) days. Sunday Qty: 30 capsule, Refills: 0      STOP taking these medications     metFORMIN (GLUCOPHAGE-XR) 500 MG 24 hr tablet      enoxaparin (LOVENOX) 40 MG/0.4ML injection          Discharge Condition:  Stable   Discharge Instructions Get Medicines  reviewed and adjusted: Please take all your medications with you for your next visit with your Primary MD  Please request your Primary MD to go over all hospital tests and procedure/radiological results at the follow up, please ask your Primary MD to get all Hospital records sent to his/her office.  If you experience worsening of your admission symptoms, develop shortness of breath, life threatening emergency, suicidal or homicidal thoughts you must seek medical attention immediately by calling 911 or calling your MD immediately if symptoms less severe.  You must read complete instructions/literature along with all the possible adverse reactions/side effects for all the Medicines you take and that have been prescribed to you. Take any new Medicines after you have completely understood and accpet all the possible adverse reactions/side effects.   Do not drive when taking Pain medications.   Do not take more than prescribed Pain, Sleep and Anxiety Medications  Special Instructions: If you have smoked or chewed Tobacco in the last 2 yrs please stop smoking, stop any regular Alcohol and or any Recreational drug use.  Wear Seat belts while driving.  Please note  You were cared for by a hospitalist during your hospital stay. Once you are discharged, your primary care physician will handle any further medical issues. Please note that NO REFILLS for any discharge medications will be authorized once you are discharged, as it is imperative that you return to your primary care physician (or establish a relationship with a primary care physician if you do not have one) for your aftercare needs so that they can reassess your need for medications and monitor your lab values.  Discharge Instructions    Diet - low sodium heart healthy    Complete by:  As directed    Increase activity slowly    Complete by:  As directed        Allergies  Allergen Reactions  . Ivp Dye [Iodinated Diagnostic Agents]  Itching      Disposition: Home with home health   Consults:  Cardiology     Significant Diagnostic Studies:  Dg Chest 2 View  Result Date: 11/26/2016 CLINICAL DATA:  61 year old with fever. Personal history of stroke in December, 2017. Current history of diabetes and asthma. Former smoker. EXAM: CHEST  2 VIEW COMPARISON:  11/23/2016, 10/27/2016 and earlier, including CT chest 05/04/2016. FINDINGS: AP semi-erect and lateral images were obtained. Lateral image is suboptimal as the patient was unable to raise her arms. Cardiac silhouette mildly enlarged, unchanged. Hilar and mediastinal contours otherwise unremarkable. Elevation of the right hemidiaphragm likely related to atelectasis at the right lower lobe. Lungs otherwise clear. Pulmonary vascularity normal. No visible pleural effusions.  Prior Harrington rod fusion of the thoracic spine and T5 and T6 corpectomy. IMPRESSION: 1. Atelectasis involving the right lower lobe. No acute cardiopulmonary disease otherwise. 2. Stable mild cardiomegaly without pulmonary edema. Electronically Signed   By: Evangeline Dakin M.D.   On: 11/26/2016 10:45   Dg Chest 2 View  Result Date: 11/23/2016 CLINICAL DATA:  Onset of chest pain last night. CVA 2 weeks ago. History of asthma, former smoker. EXAM: CHEST  2 VIEW COMPARISON:  PA and lateral chest x-ray of October 27, 2016 FINDINGS: The lungs are mildly hyperinflated with hemidiaphragm flattening. There is no significant pleural effusion. The cardiac silhouette is top-normal in size. The pulmonary vascularity is normal. The patient has undergone previous upper in mid posterior thoracic spinal fusion. There old deformities of the lateral aspects of the right third and fourth ribs. IMPRESSION: There is no acute pneumonia nor CHF. Chronic bronchitic changes, stable. Electronically Signed   By: David  Martinique M.D.   On: 11/23/2016 12:34   Dg Shoulder Right  Result Date: 11/23/2016 CLINICAL DATA:  Right shoulder pain  since a fall 2 weeks ago. Subsequent encounter. EXAM: RIGHT SHOULDER - 2+ VIEW COMPARISON:  Plain films of the right shoulder 10/26/2016 and 05/19/2016. FINDINGS: No fracture is identified. The humeral head is high-riding. 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 The humerus is located. Calcifications projecting along the posterior margin of the humeral head on the Y-view may be due to calcific rotator cuff tendinopathy. Spinal fusion hardware is noted IMPRESSION: No fracture is identified on today's examination. High-riding humeral head compatible with rotator cuff tear. Acromioclavicular osteoarthritis. Electronically Signed   By: Inge Rise M.D.   On: 11/23/2016 13:22   Ct Head Wo Contrast  Result Date: 11/26/2016 CLINICAL DATA:  61 year old with acute mental status changes, confusion and inability to feed herself which began earlier today. EXAM: CT HEAD WITHOUT CONTRAST TECHNIQUE: Contiguous axial images were obtained from the base of the skull through the vertex without intravenous contrast. COMPARISON:  MRI brain 10/26/2016. CT head 10/26/2016, 02/28/2016 dating back to 01/26/2015. FINDINGS: Brain: Ventricular system normal in size and appearance for age. Moderate changes of small vessel disease of the white matter diffusely. Geographic low attenuation in the right superior parietal lobe corresponding to the acute stroke identified on the MRI 1 month ago. Lacunar stroke in the right thalamus, corresponding to 1 of the acute strokes identified on that examination. No residua elsewhere from the punctate strokes in both cerebral hemispheres and the cerebellum identified on that examination. No mass lesion. No midline shift. No acute hemorrhage or hematoma. No extra-axial fluid collections. No evidence of acute infarction. Vascular: Severe bilateral carotid artery siphon atherosclerosis for age. Skull: No skull fracture or other significant focal osseous abnormality involving the skull. Small bone  island in the right frontal bone above the right orbit as noted previously. Sinuses/Orbits: Visualized paranasal sinuses, bilateral mastoid air cells and bilateral middle ear cavities well-aerated. Visualized orbits normal. Other: None. IMPRESSION: 1. No acute intracranial abnormality. 2. Low attenuation in the right posterior parietal cortex and in the right thalamus at the sites of the prior strokes identified on the MRI 1 month ago. The multiple punctate strokes in both cerebral hemispheres and both cerebral hemispheres on the prior MRI are not visible on today's CT. Electronically Signed   By: Evangeline Dakin M.D.   On: 11/26/2016 16:20   Nm Myocar Multi W/spect W/wall Motion / Ef  Result Date: 11/25/2016 CLINICAL DATA:  Chest pain EXAM: MYOCARDIAL IMAGING WITH  SPECT (REST AND PHARMACOLOGIC-STRESS) GATED LEFT VENTRICULAR WALL MOTION STUDY LEFT VENTRICULAR EJECTION FRACTION TECHNIQUE: Standard myocardial SPECT imaging was performed after resting intravenous injection of 10 mCi Tc-48mtetrofosmin. Subsequently, intravenous infusion of Lexiscan was performed under the supervision of the Cardiology staff. At peak effect of the drug, 30 mCi Tc-972metrofosmin was injected intravenously and standard myocardial SPECT imaging was performed. Quantitative gated imaging was also performed to evaluate left ventricular wall motion, and estimate left ventricular ejection fraction. COMPARISON:  None. FINDINGS: Perfusion: No significant decreased activity in the left ventricle on stress imaging to suggest reversible ischemia or infarction. Wall Motion: Septal hypokinesis noted.  No LV chamber dilatation. Left Ventricular Ejection Fraction: 71 % End diastolic volume 20 ml End systolic volume 6 ml IMPRESSION: 1. No significant reversible ischemia or infarction. 2. Septal hypokinesis. 3. Left ventricular ejection fraction 71% 4. Non invasive risk stratification*: Low *2012 Appropriate Use Criteria for Coronary  Revascularization Focused Update: J Am Coll Cardiol. 209798;92(1):[email or web address] Electronically Signed   By: M.Jerilynn Mages Shick M.D.   On: 11/25/2016 13:18   Nm Pulmonary Perf And Vent  Result Date: 11/27/2016 CLINICAL DATA:  Elevated D-dimer, shortness of breath, chest pain EXAM: NUCLEAR MEDICINE VENTILATION - PERFUSION LUNG SCAN TECHNIQUE: Ventilation images were obtained in multiple projections using inhaled aerosol Tc-991mPA. Perfusion images were obtained in multiple projections after intravenous injection of Tc-13m43m. RADIOPHARMACEUTICALS:  31.3 mCi Technetium-13m 66m aerosol inhalation and 4.2 mCi Technetium-13m M52mV COMPARISON:  Chest x-ray 11/26/2016.  VQ scan 10/27/2016 FINDINGS: Ventilation: No focal ventilation defect. Perfusion: No wedge shaped peripheral perfusion defects to suggest acute pulmonary embolism. IMPRESSION: No evidence of pulmonary embolus. Electronically Signed   By: Kevin Rolm Baptise  On: 11/27/2016 08:09       Filed Weights   11/23/16 1144 11/23/16 1644  Weight: 68.9 kg (152 lb) 49.4 kg (108 lb 14.4 oz)     Microbiology: No results found for this or any previous visit (from the past 240 hour(s)).     Blood Culture    Component Value Date/Time   SDES BLOOD LEFT ANTECUBITAL 02/28/2016 2000   SPECREQUEST  02/28/2016 2000    BOTTLES DRAWN AEROBIC AND ANAEROBIC BLUE 6CC RED 5CC   CULT  02/28/2016 2000    NO GROWTH 5 DAYS Performed at [facility]/2017 FINAL 02/28/2016 2000      Labs: Results for orders placed or performed during the hospital encounter of 11/23/16 (from the past 48 hour(s))  Glucose, capillary     Status: Abnormal   Collection Time: 11/25/16  1:10 PM  Result Value Ref Range   Glucose-Capillary 155 (H) 65 - 99 mg/dL  Glucose, capillary     Status: Abnormal   Collection Time: 11/25/16  5:20 PM  Result Value Ref Range   Glucose-Capillary 216 (H) 65 - 99 mg/dL   Glucose, capillary     Status: Abnormal   Collection Time: 11/25/16  9:05 PM  Result Value Ref Range   Glucose-Capillary 202 (H) 65 - 99 mg/dL  CBC     Status: Abnormal   Collection Time: 11/26/16  4:49 AM  Result Value Ref Range   WBC 14.2 (H) 4.0 - 10.5 K/uL   RBC 3.97 3.87 - 5.11 MIL/uL   Hemoglobin 10.1 (L) 12.0 - 15.0 g/dL   HCT 30.8 (L) 36.0 - 46.0 %   MCV 77.6 (L) 78.0 - 100.0 fL   MCH 25.4 (L) 26.0 - 34.0 pg  MCHC 32.8 30.0 - 36.0 g/dL   RDW 14.9 11.5 - 15.5 %   Platelets 167 150 - 400 K/uL  Glucose, capillary     Status: Abnormal   Collection Time: 11/26/16  6:22 AM  Result Value Ref Range   Glucose-Capillary 157 (H) 65 - 99 mg/dL  Glucose, capillary     Status: Abnormal   Collection Time: 11/26/16 11:33 AM  Result Value Ref Range   Glucose-Capillary 166 (H) 65 - 99 mg/dL  Blood gas, arterial     Status: Abnormal   Collection Time: 11/26/16 12:18 PM  Result Value Ref Range   FIO2 21.00    pH, Arterial 7.422 7.350 - 7.450   pCO2 arterial 28.4 (L) 32.0 - 48.0 mmHg   pO2, Arterial 61.1 (L) 83.0 - 108.0 mmHg   Bicarbonate 18.2 (L) 20.0 - 28.0 mmol/L   Acid-base deficit 5.4 (H) 0.0 - 2.0 mmol/L   O2 Saturation 90.8 %   Patient temperature 98.6    Collection site LEFT RADIAL    Drawn by [id number]    Sample type ARTERIAL DRAW    Allens test (pass/fail) PASS PASS  Procalcitonin - Baseline     Status: None   Collection Time: 11/26/16 12:22 PM  Result Value Ref Range   Procalcitonin 0.13 ng/mL    Comment:        Interpretation: PCT (Procalcitonin) <= 0.5 ng/mL: Systemic infection (sepsis) is not likely. Local bacterial infection is possible. (NOTE)         ICU PCT Algorithm               Non ICU PCT Algorithm    ----------------------------     ------------------------------         PCT < 0.25 ng/mL                 PCT < 0.1 ng/mL     Stopping of antibiotics            Stopping of antibiotics       strongly encouraged.               strongly encouraged.     ----------------------------     ------------------------------       PCT level decrease by               PCT < 0.25 ng/mL       >= 80% from peak PCT       OR PCT 0.25 - 0.5 ng/mL          Stopping of antibiotics                                             encouraged.     Stopping of antibiotics           encouraged.    ----------------------------     ------------------------------       PCT level decrease by              PCT >= 0.25 ng/mL       < 80% from peak PCT        AND PCT >= 0.5 ng/mL            Continuin g antibiotics  encouraged.       Continuing antibiotics            encouraged.    ----------------------------     ------------------------------     PCT level increase compared          PCT > 0.5 ng/mL         with peak PCT AND          PCT >= 0.5 ng/mL             Escalation of antibiotics                                          strongly encouraged.      Escalation of antibiotics        strongly encouraged.   Glucose, capillary     Status: Abnormal   Collection Time: 11/26/16  4:29 PM  Result Value Ref Range   Glucose-Capillary 207 (H) 65 - 99 mg/dL  Urinalysis, Routine w reflex microscopic     Status: Abnormal   Collection Time: 11/26/16  5:18 PM  Result Value Ref Range   Color, Urine YELLOW YELLOW   APPearance CLEAR CLEAR   Specific Gravity, Urine 1.011 1.005 - 1.030   pH 6.0 5.0 - 8.0   Glucose, UA NEGATIVE NEGATIVE mg/dL   Hgb urine dipstick NEGATIVE NEGATIVE   Bilirubin Urine NEGATIVE NEGATIVE   Ketones, ur NEGATIVE NEGATIVE mg/dL   Protein, ur NEGATIVE NEGATIVE mg/dL   Nitrite NEGATIVE NEGATIVE   Leukocytes, UA TRACE (A) NEGATIVE   RBC / HPF 0-5 0 - 5 RBC/hpf   WBC, UA 6-30 0 - 5 WBC/hpf   Bacteria, UA NONE SEEN NONE SEEN   Squamous Epithelial / LPF 6-30 (A) NONE SEEN   Ca Oxalate Crys, UA PRESENT   Glucose, capillary     Status: Abnormal   Collection Time: 11/26/16  9:04 PM  Result Value Ref Range    Glucose-Capillary 167 (H) 65 - 99 mg/dL  Comprehensive metabolic panel     Status: Abnormal   Collection Time: 11/27/16  2:28 AM  Result Value Ref Range   Sodium 138 135 - 145 mmol/L   Potassium 3.8 3.5 - 5.1 mmol/L   Chloride 111 101 - 111 mmol/L   CO2 19 (L) 22 - 32 mmol/L   Glucose, Bld 167 (H) 65 - 99 mg/dL   BUN 23 (H) 6 - 20 mg/dL   Creatinine, Ser 1.00 0.44 - 1.00 mg/dL   Calcium 8.5 (L) 8.9 - 10.3 mg/dL   Total Protein 5.1 (L) 6.5 - 8.1 g/dL   Albumin 2.2 (L) 3.5 - 5.0 g/dL   AST 30 15 - 41 U/L   ALT 27 14 - 54 U/L   Alkaline Phosphatase 100 38 - 126 U/L   Total Bilirubin 0.4 0.3 - 1.2 mg/dL   GFR calc non Af Amer >60 >60 mL/min   GFR calc Af Amer >60 >60 mL/min    Comment: (NOTE) The eGFR has been calculated using the CKD EPI equation. This calculation has not been validated in all clinical situations. eGFR's persistently <60 mL/min signify possible Chronic Kidney Disease.    Anion gap 8 5 - 15  CBC     Status: Abnormal   Collection Time: 11/27/16  2:28 AM  Result Value Ref Range   WBC 10.1 4.0 - 10.5 K/uL   RBC 3.53 (L)  3.87 - 5.11 MIL/uL   Hemoglobin 9.0 (L) 12.0 - 15.0 g/dL   HCT 27.6 (L) 36.0 - 46.0 %   MCV 78.2 78.0 - 100.0 fL   MCH 25.5 (L) 26.0 - 34.0 pg   MCHC 32.6 30.0 - 36.0 g/dL   RDW 15.2 11.5 - 15.5 %   Platelets 132 (L) 150 - 400 K/uL  Glucose, capillary     Status: Abnormal   Collection Time: 11/27/16  6:22 AM  Result Value Ref Range   Glucose-Capillary 219 (H) 65 - 99 mg/dL     Lipid Panel     Component Value Date/Time   CHOL 105 10/27/2016 0532   TRIG 55 10/27/2016 0532   HDL 61 10/27/2016 0532   CHOLHDL 1.7 10/27/2016 0532   VLDL 11 10/27/2016 0532   LDLCALC 33 10/27/2016 0532     Lab Results  Component Value Date   HGBA1C 4.7 (L) 10/27/2016   HGBA1C 5.5 02/29/2016   HGBA1C 5.8 (H) 11/07/2015     Lab Results  Component Value Date   LDLCALC 33 10/27/2016   CREATININE 1.00 11/27/2016     HPI :  Amber [name] is a 61 yo  female with PMH of HTN, DM, chronic diastolic HF, chronic pain, asthma, CVA and PFO. She presented with weakness, tremor, and near syncope on 10/25/16. Underwent MRI and diagnosed with acute stroke, noting multiple bilateral cerebral infarcts consistent with a central embolic phenomena. She did well regarding that admission and was discharged to skilled nursing facility for further rehabilitation or recovery. She was referred to Dr. Burt Knack after undergoing a bubble study noting a PFO. Images reviewed by Dr. Burt Knack noted a large right to left shunt at the level of the atrial septum with Valsalva. The option of transcatheter PFO closure was presented at that visit, and she made the decision to undergo procedure. Plan at that time was to see her back in the office to schedule her for PFO closure what she completed rehabilitation at the skilled nursing facility. She was replaced on statin drug and antiplatelet therapy with aspirin during her previous admission.   States she only stayed in the rehabilitation facility for about a week, but was unhappy there so her daughter brought her home. Since that time she has not been very independent at home, and is very unsteady on her feet requiring a walker. She has sustained multiple falls, and also a right glenoid fracture. Also notedshe has been under increased stress recently, and is worried that she may have another stroke. Has been intermittently experiencing burning sensation in her chest, which she attributed to GERD. States she's been taking Nexium which has relieved her symptoms.Reports Wednesday night she was with her kids, and they were upsetting her. States she developed a frontal headache, with a pressure/burning sensation in her chest that radiated up into posterior neck, and left axilla. States she became concerned because these were similar symptoms to when she had her previous CVA. Symptoms did eventually subside, but she presented to [facility] for further  evaluation.   In the ED her labs showed potassium 2.9, mag 1.2creatinine 0.89, troponin 0.07>>0.07>>0.07>>0.06, hemoglobin 10, d-dimer 2.59. Chest x-ray showed no acute findings, right shoulder x-ray showed a fracture. EKG showed sinus rhythm with nonspecific ST changes noticed on previous tracings. She was transferred to [facility] her internal medicine hospital admission.   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 Sherren Mocha on 12/12 for PFO closure, needs outpatient follow-up with Dr. Burt Knack Plan was to start Plavix prior to PFO closure Patient admitted for ischemic workup Nuclear stress test showed normal LVF with no ischemia and septal HK. No further cardiac workup  Continue aspirin, statin   Recent Embolic CVA Multiple bilateral anterior and supratentorial infarcts, embolic secondary to unknown source  MRInumerous supra and infratentorial bilateral embolic infarcts, largest at right MCA frontal cortical region  MRAhead and neckunremarkable  2D Echo-LV EF: 60% - 65%  TCD with bubble study -Transcranial Doppler Bubble Study indicative of a moderateright to left intracardiac shunt. We will have TEE on Monday to further confirm.  RUE and BLE venous doppler -Positive for chronic appearing thrombus in a short segment of the cephalic vein in the upper arm.Negative for DVT. Lower extremity DVT negative  EKG - sinus tach  TEE to look for embolic source.positive for PFO. Cardiology Dr Burt Knack  was notified  LDL33  HgbA1c4.7  UDS  positive for opiates and benzodiazepines  Hypercoagulable work up negative(anti--DNA, ANA, cardiolipin antibody, homocystine[except this was mildly elevated], beta 2 glycoprotein, lupus anticoagulant, are all negative-CRP, ESR, HIV, RPR are all negative)  VitB12, TSH 0.85  EEG unremarkable  Continue aspirin 325 mg as recommended by neurology,per dr cooper start Plavix prior to PFO closure   Impacted fracture of the right shoulder,Right glenoid fx Orthopedics consult requested-Dr. Margarito Liner mx  Patient would need outpatient follow-up  Seizure Neurology continues to have concerns for seizure activity EEG diffuse cerebral dysfunction that is non-specific in etiology Currently on Vimpat   HTN: -Permissive hypertension for now, resumemetoprolol and furosemide/K -Continue statin  NIDDM: Discontinued home oral hypoglycemics during her last admission, hemoglobin A1c 4.7,   Chronic pain Patient takes multiple narcotic medications at home  Oxymorphone,   Asthma: Without exacerbation, continue [place]  Anxiety-patient takes Valium at home  This will be continued as patient did show signs of benzodiazepine withdrawal, because she did not receive Valium during the initial part of her hospitalization     Acute encephalopathy, in the setting of fever, suspect that the patient had aspiration pneumonitis and was started on Zosyn. This has been switched to Augmentin 5 days Chest x-ray shows possible atelectasis/pneumonia in the right lower lobe Blood culture 2, no growth so far UA negative Pro calcitonin pending CT of the head without contrast due to recent stroke, stable ABG 7.4, PCO2 28 Patient has been afebrile after initiation of Zosyn and will be discharged home on Augmentin    Discharge Exam  Blood pressure 122/86, pulse 90, temperature 98.2 F (36.8 C), temperature source Oral, resp. rate 19, height 5' (1.524 m), weight 49.4 kg (108 lb  14.4 oz), SpO2 96 %. General exam: Appears calm and comfortable  Respiratory system: Clear to auscultation. Respiratory effort normal. Cardiovascular system: S1 & S2 heard, RRR. No JVD, murmurs, rubs, gallops or clicks. No pedal edema. Gastrointestinal system: Abdomen is nondistended, soft and nontender. No organomegaly or masses felt. Normal bowel sounds heard. Central nervous system: Confused. No focal neurological deficits. Extremities: Symmetric 5 x 5 power. Skin: No rashes, lesions or ulcers Psychiatry: Judgement and insight appear normal. Mood & affect  appropriate.      Follow-up Information    Sandi Mariscal, MD. Call.   Specialty:  Internal Medicine Why:  Hospital follow-up follow, see PCP in 3-5 days Contact information: [place] [address] [phone]        Sherren Mocha, MD. Call.   Specialty:  Cardiology Why:  To make appointment for PFO closure with cardiology Contact information: [address] [place] [address] [phone]           Signed: Reyne Dumas 11/27/2016, 9:01 AM        Time spent >45 mins

## 2016-11-27 NOTE — Progress Notes (Signed)
Clinical Social Worker met patient and daughter at bedside due to concerns from physical therapist. CSW spoke to patient and patient stated she would like to go to SNF as long as they treat her well. Patient's daughter stated no and that patient just needs to go home. Patient stated okay and decided to follow daughters command. CSW is signing off.  Rhea Pink, MSW,  [place]

## 2016-11-27 NOTE — Progress Notes (Signed)
Patient discharged per MD order. R AC IV removed, telemetry d/c, CCMD notified. Placed patient in paper scrubs. AVS went over with daughter and patient. Including follow up appointments and medications. During transfer from chair to wheelchair for discharge daughter refused to allow tech to transfer the patient and the daughter dropped the patient. She fell on her bottom, no head injuries. MD was paged. Daughter refused to wait for further evaluation for fall and patient followed daughters commands. Abrol, MD is aware of fall and that family refused to wait. Daughter also refused to talk to social work about her mother going to a SNF which was highly recommended by PT. She stated "it was a waste of time and she was going home, that she didn't have time for this shit." Patient had no complaints of pain. Wheeled to main entrance by [place], [place]. To home with daughter by car.  Minerva Ends RN
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
## 2016-11-27 NOTE — Progress Notes (Signed)
PT Cancellation Note  Patient Details Name: Amber [name] MRN: [id number] DOB: 1956/07/19   Cancelled Treatment:    Reason Eval/Treat Not Completed: Patient at procedure or test/unavailable   Zyrus Hetland B Nasiya Pascual 11/27/2016, 7:59 AM  Delaney Meigs, PT [phone]

## 2016-11-30 ENCOUNTER — Other Ambulatory Visit (HOSPITAL_COMMUNITY): Payer: Self-pay | Admitting: Internal Medicine

## 2016-11-30 DIAGNOSIS — R1310 Dysphagia, unspecified: Secondary | ICD-10-CM

## 2016-12-02 LAB — CULTURE, BLOOD (ROUTINE X 2)
Culture: NO GROWTH
Culture: NO GROWTH

## 2016-12-07 ENCOUNTER — Inpatient Hospital Stay (HOSPITAL_COMMUNITY): Admission: RE | Admit: 2016-12-07 | Payer: Medicaid Other | Source: Ambulatory Visit

## 2016-12-07 ENCOUNTER — Ambulatory Visit (HOSPITAL_COMMUNITY): Admit: 2016-12-07 | Payer: Medicaid Other

## 2016-12-12 IMAGING — CT CT HEAD W/O CM
3 series · 16 of 45 positions shown, 19 images · non-contrast
Comparison: Head CT 02/28/2016

CLINICAL DATA: Fall 2 weeks ago. Fall backward after walker
malfunction. Focal seizure.

EXAM:
CT HEAD WITHOUT CONTRAST
TECHNIQUE: Contiguous axial images were obtained from the base of the skull
through the vertex without intravenous contrast.

[Series 2: head 5.0 h30s · axial · 0.40mm/px · z∈[-127,-12]mm · 10 of 28 slices shown, 13 images]
[im 3/28  brain]
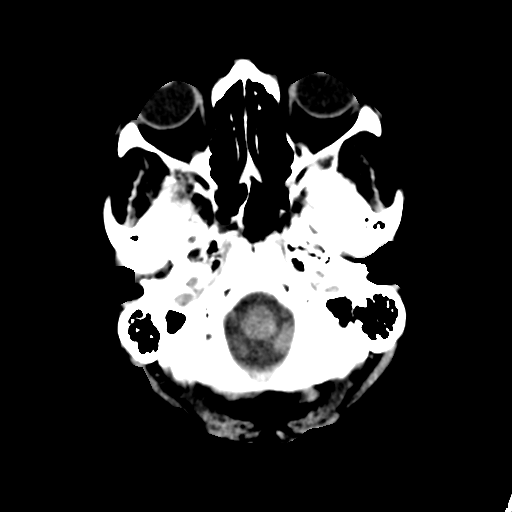
[im 3/28  bone]
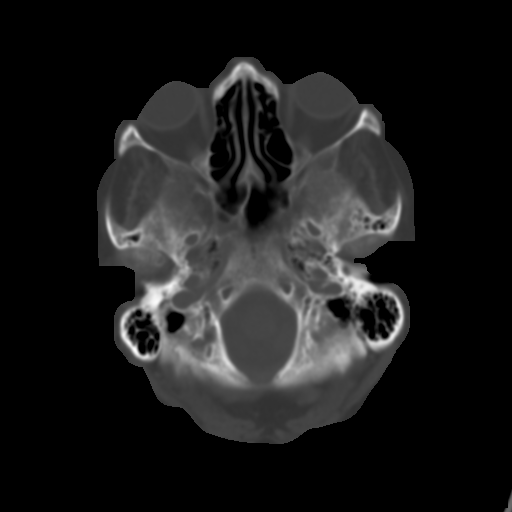
[im 5/28  brain]
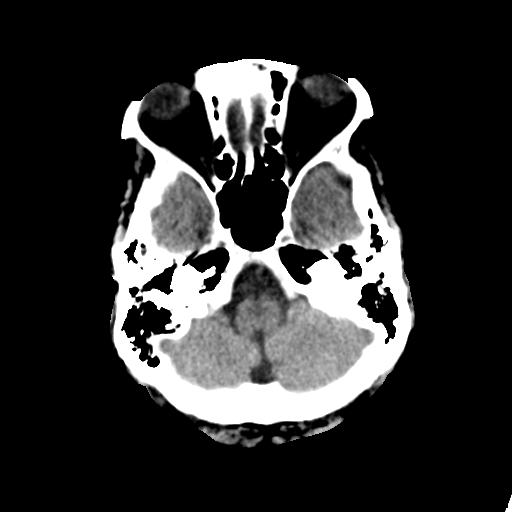
[im 8/28  brain]
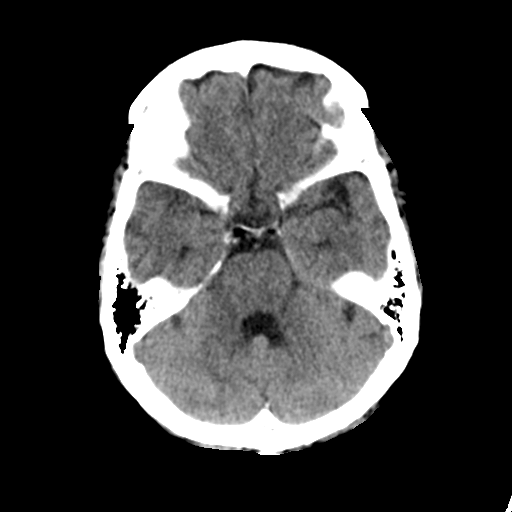
[im 11/28  brain]
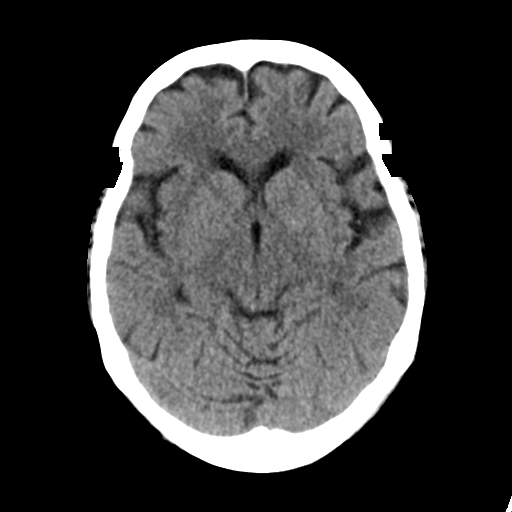
[im 13/28  brain]
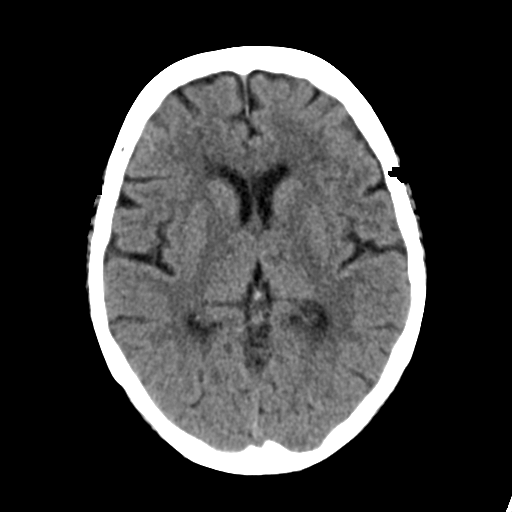
[im 13/28  bone]
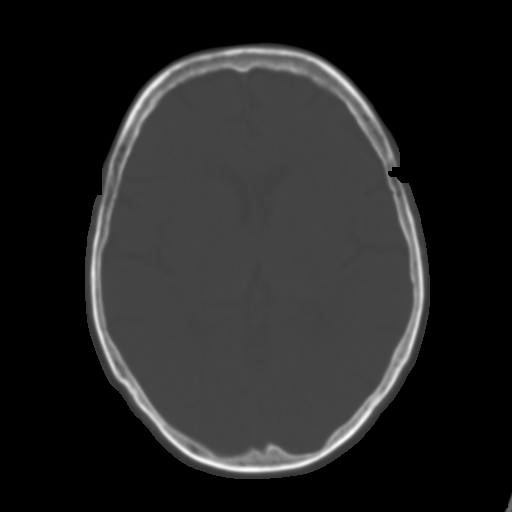
[im 16/28  brain]
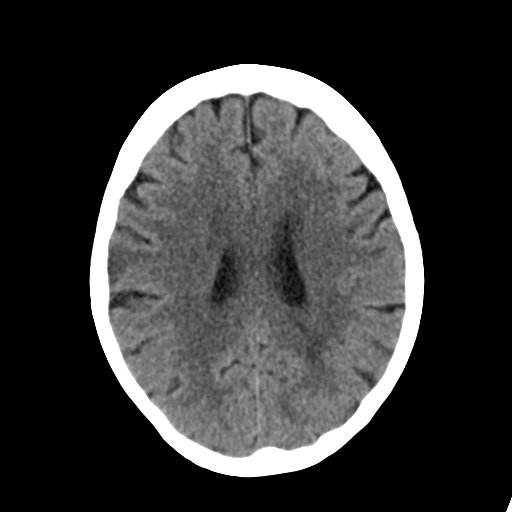
[im 18/28  brain]
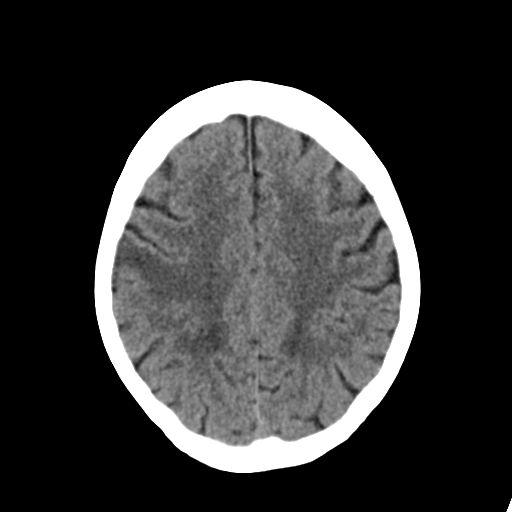
[im 21/28  brain]
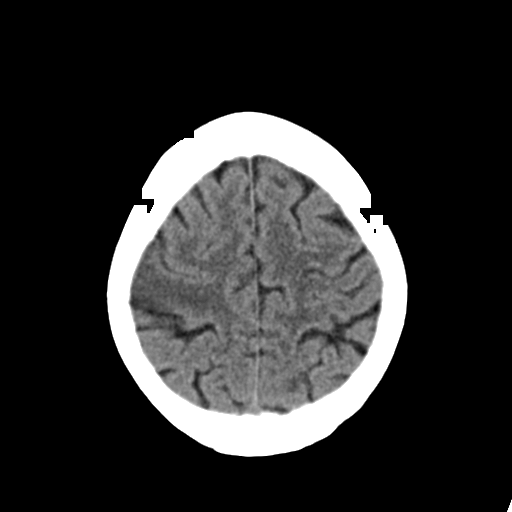
[im 24/28  brain]
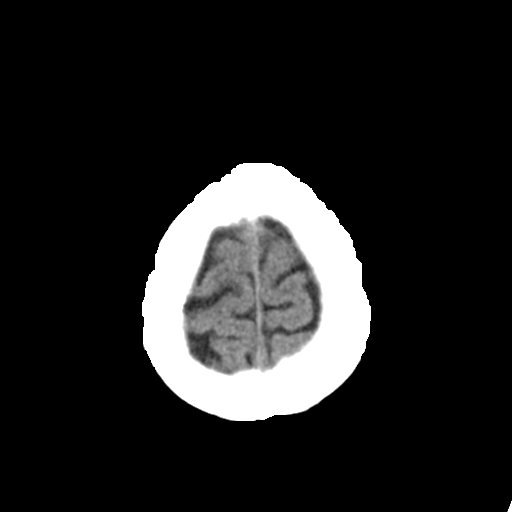
[im 24/28  bone]
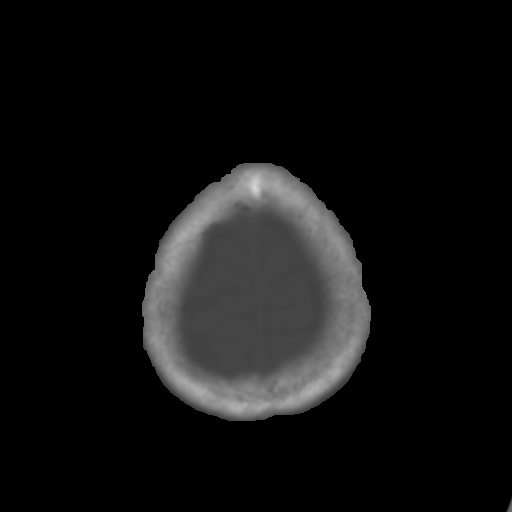
[im 26/28  brain]
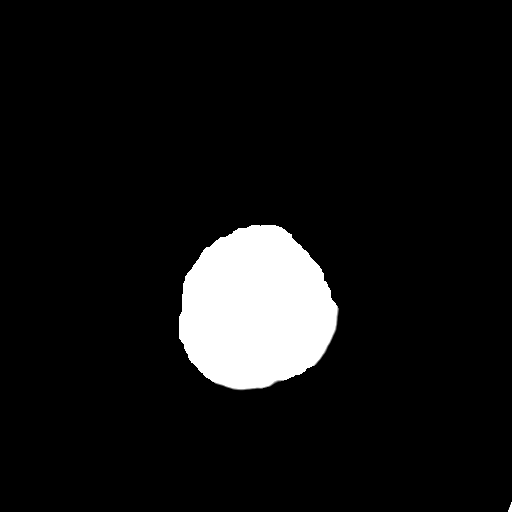

[Series 4: head 3.0 mpr cor · coronal · 0.27mm/px · 3 of 57 slices shown]
[im 19/57  brain]
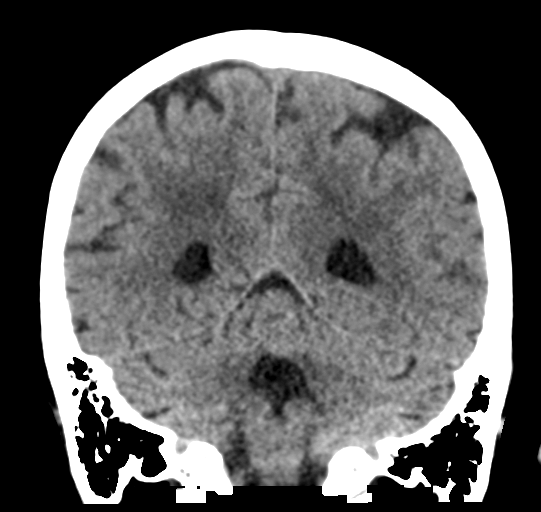
[im 25/57  brain]
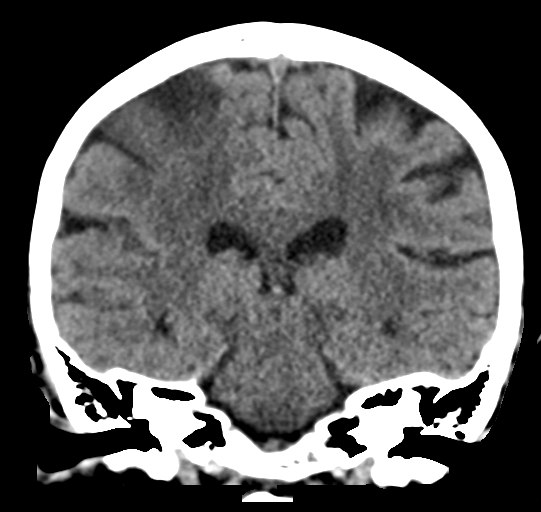
[im 32/57  brain]
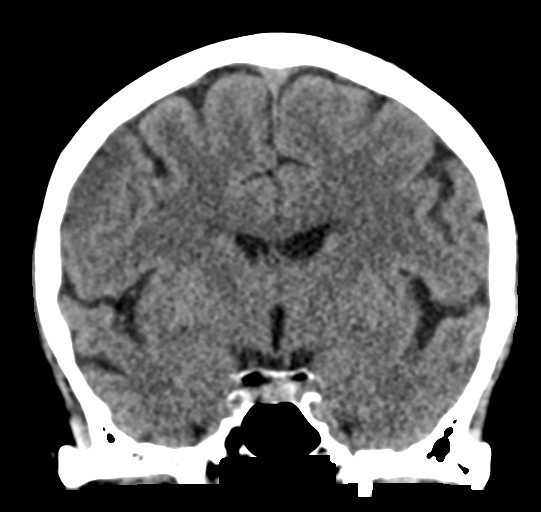

[Series 5: head 3.0 mpr sag · sagittal · 0.28mm/px · 3 of 48 slices shown]
[im 16/48  brain]
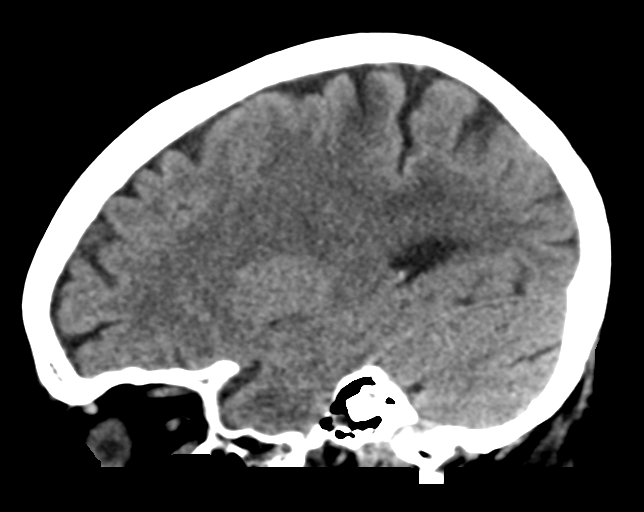
[im 24/48  brain]
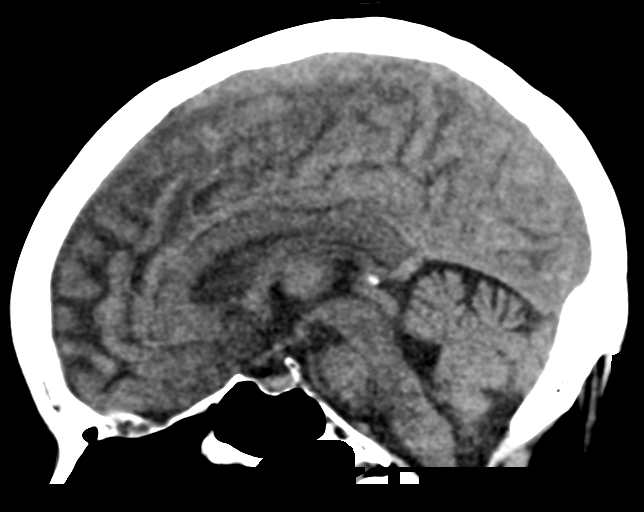
[im 32/48  brain]
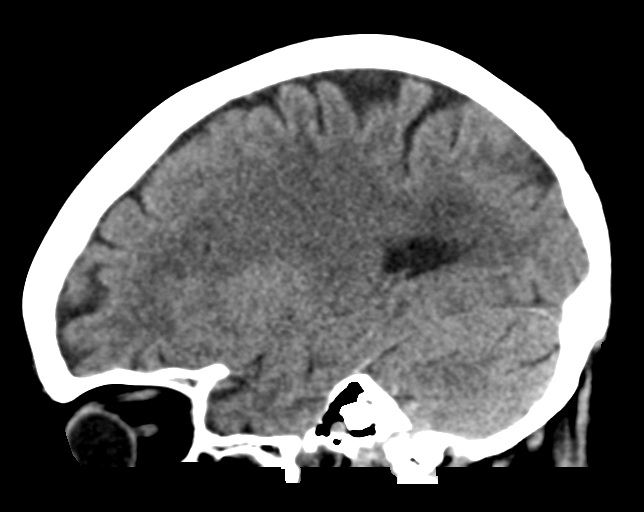

[16 of 45 positions shown; findings below may reference images not displayed]

FINDINGS: Brain: Focal cortical hypodensity in the posterior right frontal
lobe concerning for subacute ischemia. No intracranial hemorrhage.
Background chronic small vessel ischemia is similar. No subdural or
extra-axial fluid collection. No hydrocephalus.

Vascular: Atherosclerosis of skullbase vasculature without
hyperdense vessel or abnormal calcification.

Skull: No acute fracture. Stable sclerotic focus in the right
frontal bone.

Sinuses/Orbits: Paranasal sinuses and mastoid air cells are clear.
The visualized orbits are unremarkable.

Other: None.
IMPRESSION: Findings concerning for subacute ischemia in the posterior right
frontal lobe.

These results were called by telephone at the time of interpretation
on 10/26/2016 at [DATE] to Dr. NETTY MCFLY , who verbally
acknowledged these results.

## 2016-12-12 IMAGING — MR MR HEAD W/O CM
8 of 10 series · 37 of 48 positions shown · non-contrast
Comparison: CT HEAD October 26, 2016 at 3636 hours

CLINICAL DATA: Tremor, fell on Thanksgiving with head injury.
Headache and dizziness. History of diabetes, seizures, hypertension.

EXAM:
MRI HEAD WITHOUT CONTRAST
TECHNIQUE: Multiplanar, multiecho pulse sequences of the brain and surrounding
structures were obtained without intravenous contrast.

[Series 3: DWI · axial · 3.0mm · 0.94mm/px · z∈[-29,+106]mm · 9 of 93 slices shown (1 of 2)]
[im 1/93]
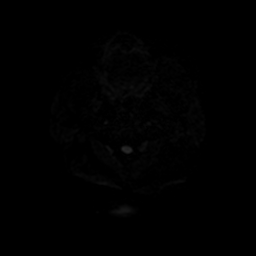
[im 17/93]
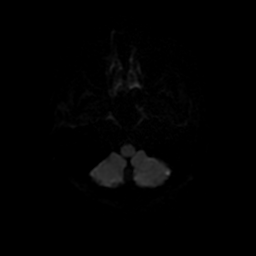
[im 26/93]
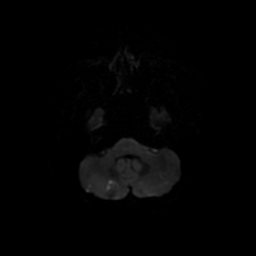
[im 42/93]
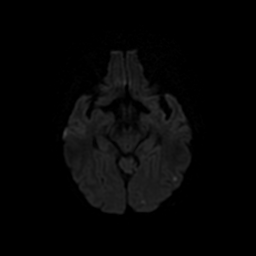
[im 51/93]
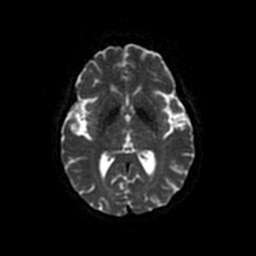
[im 67/93]
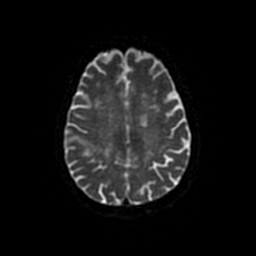
[im 76/93]
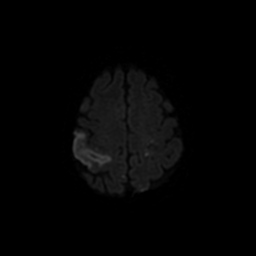
[im 84/93]
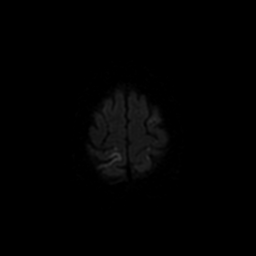
[im 93/93]
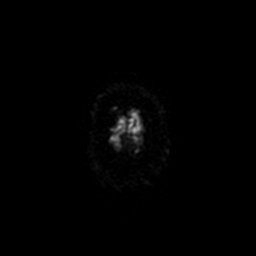

[Series 4: FLAIR · sagittal · 5.0mm · 0.47mm/px · 3 of 23 slices shown (1 of 2)]
[im 1/23]
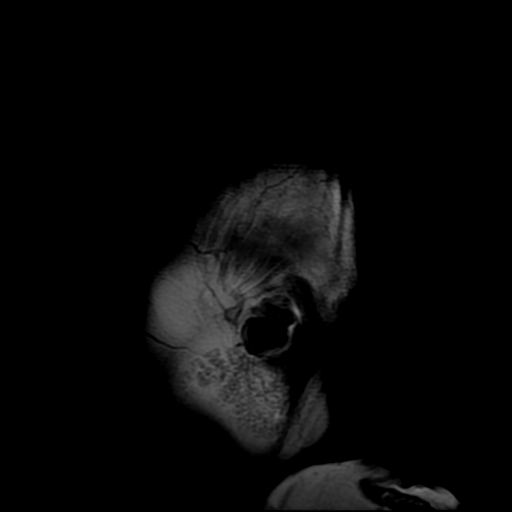
[im 12/23]
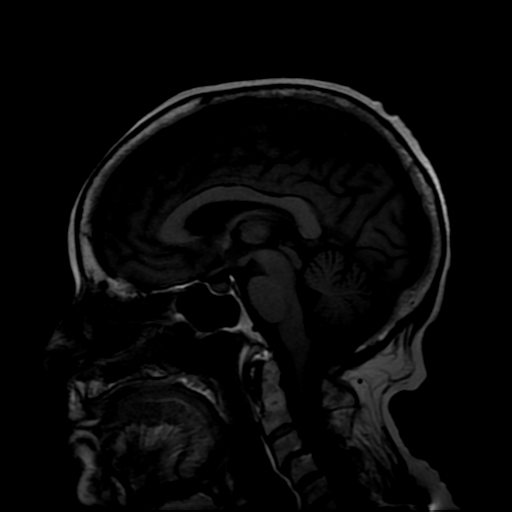
[im 23/23]
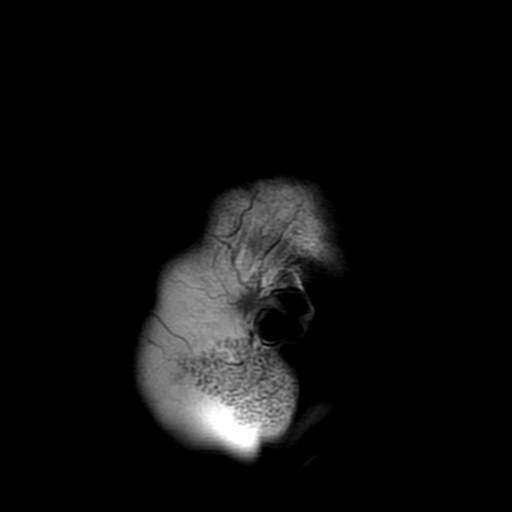

[Series 6: T2 · axial · 5.0mm · 0.43mm/px · z∈[-29,+106]mm · 3 of 24 slices shown (1 of 2)]
[im 1/24]
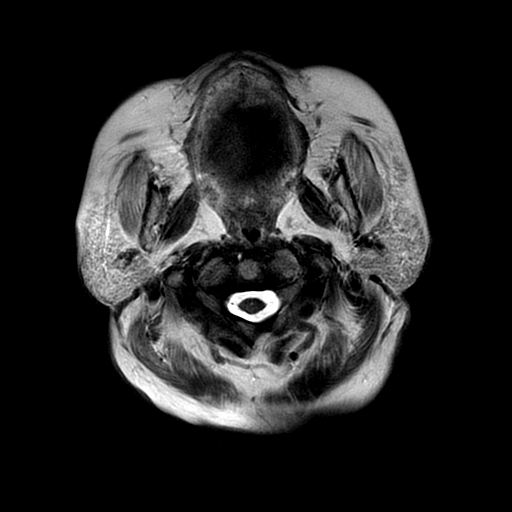
[im 12/24]
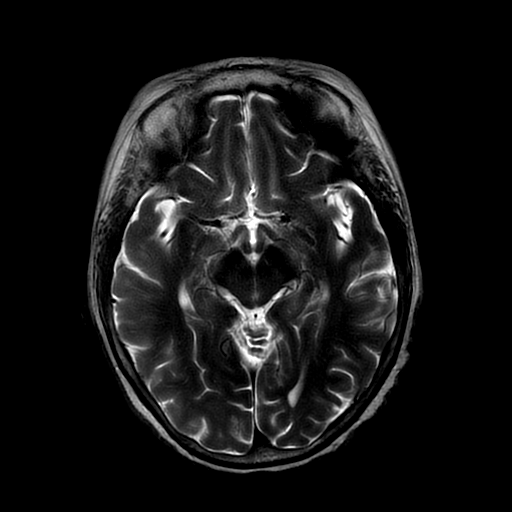
[im 24/24]
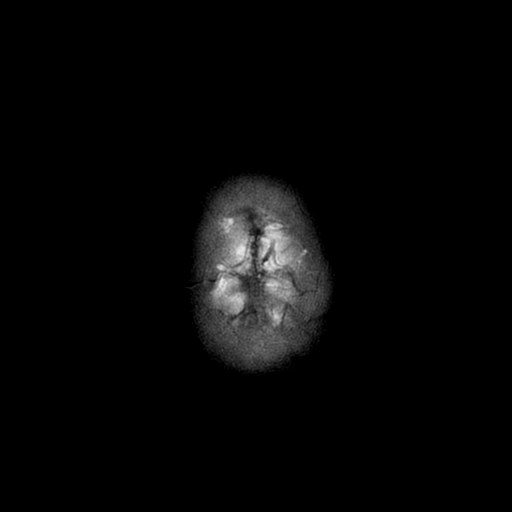

[Series 7: FLAIR · axial · 5.0mm · 0.43mm/px · z∈[-29,+106]mm · 3 of 24 slices shown (2 of 2)]
[im 1/24]
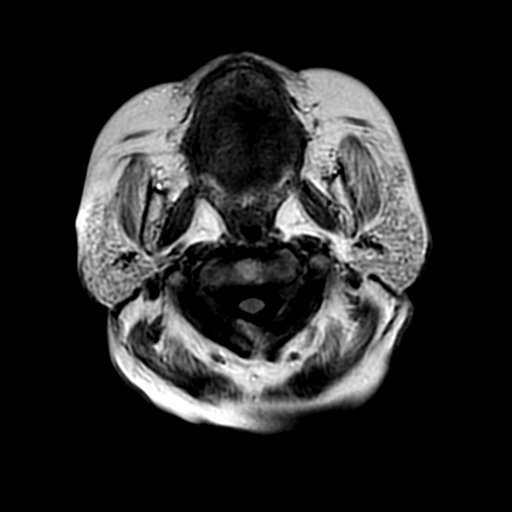
[im 12/24]
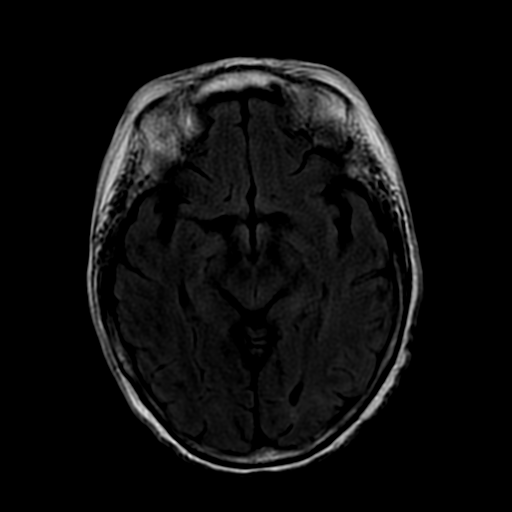
[im 24/24]
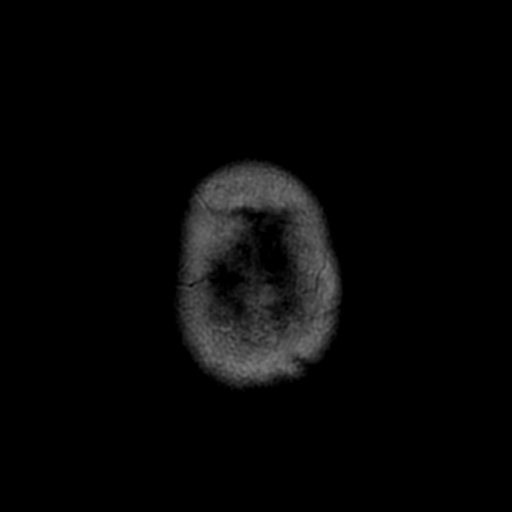

[Series 8: DWI · coronal · 4.0mm · 0.94mm/px · 8 of 66 slices shown (2 of 2)]
[im 1/66]
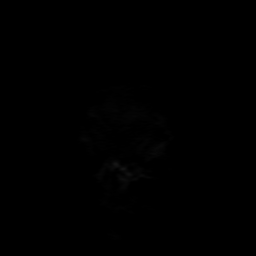
[im 10/66]
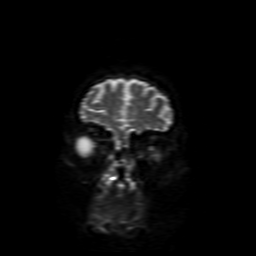
[im 19/66]
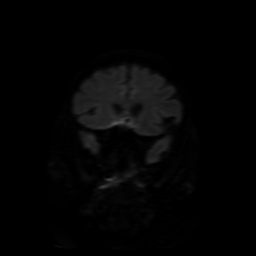
[im 28/66]
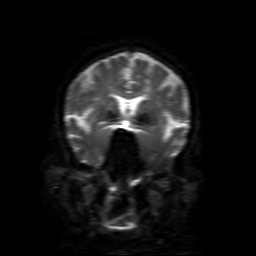
[im 38/66]
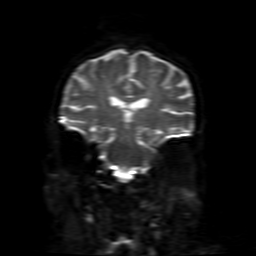
[im 47/66]
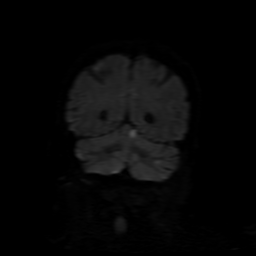
[im 56/66]
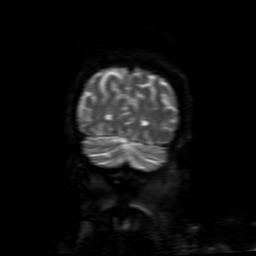
[im 66/66]
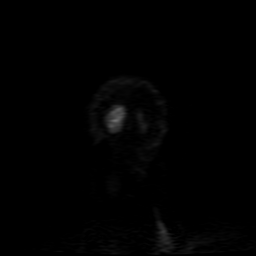

[Series 11: T2 · coronal · 5.0mm · 0.47mm/px · 1 of 28 slices shown (2 of 2)]
[im 1/28]
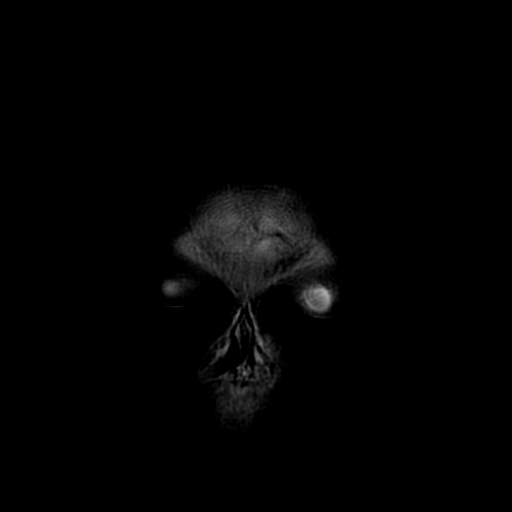

[Series 350: ADC · axial · 3.0mm · 0.94mm/px · z∈[-29,+106]mm · 6 of 47 slices shown (1 of 2)]
[im 1/47]
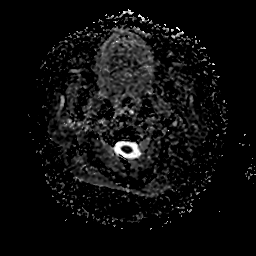
[im 10/47]
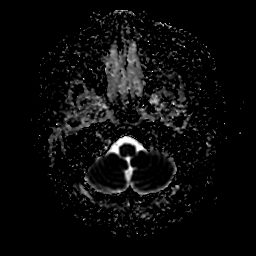
[im 19/47]
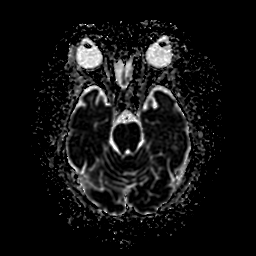
[im 28/47]
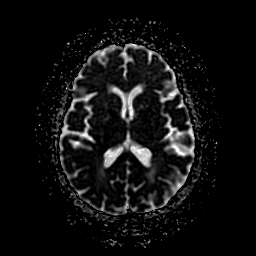
[im 37/47]
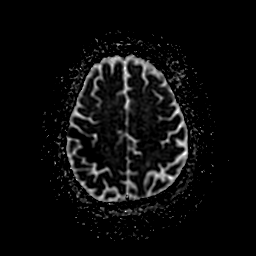
[im 47/47]
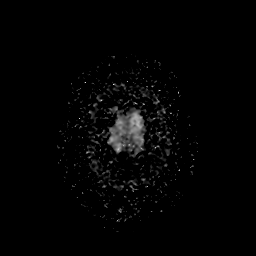

[Series 850: ADC · coronal · 4.0mm · 0.94mm/px · 4 of 33 slices shown (2 of 2)]
[im 1/33]
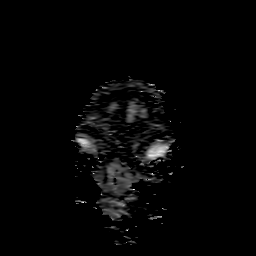
[im 11/33]
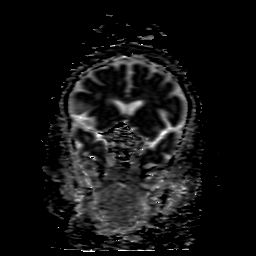
[im 22/33]
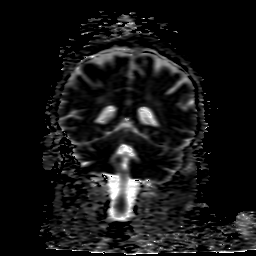
[im 33/33]
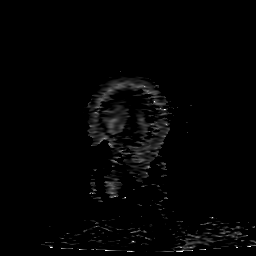

[37 of 48 positions shown; findings below may reference images not displayed]

FINDINGS: BRAIN: Confluent up to 4.2 cm reduced diffusion RIGHT posterior
frontal parietal lobe, about the central sulcus. Corresponding low
ADC values and faint T2 hyperintense signal. Numerous sub cm foci of
reduced diffusion bilateral frontal, parietal and bilateral
occipital lobes. Symmetric reduced diffusion mesial thalamus
suggests artery of Elaine. Multiple foci of reduced diffusion
measure up to 11 mm in the cerebellum with low ADC values.
Ventricles and sulci are normal for patient's age. Patchy to
confluent supratentorial white matter FLAIR T2 hyperintensities
exclusive of the aforementioned abnormalities. No midline shift,
mass effect or masses. No abnormal extra-axial fluid collections.

VASCULAR: Normal major intracranial vascular flow voids present at
skull base.

SKULL AND UPPER CERVICAL SPINE: No abnormal sellar expansion. No
suspicious calvarial bone marrow signal. Craniocervical junction
maintained. Grade 1 C3-4 and C4-5 anterolisthesis.

SINUSES/ORBITS: The mastoid air-cells and included paranasal sinuses
are well-aerated. The included ocular globes and orbital contents
are non-suspicious.

OTHER: Patient is edentulous.
IMPRESSION: Numerous supra and infratentorial acute infarcts spanning multiple
vascular territories, largest in RIGHT frontoparietal lobes
corresponding to CT abnormality. Findings consistent with central
embolic phenomena.

Mild to moderate chronic small vessel ischemic disease, advanced for
age.

## 2016-12-12 IMAGING — CR DG SHOULDER 2+V*R*
2 series · 3 of 3 positions shown · non-contrast
Comparison: 05/19/2016 right shoulder radiographs.

CLINICAL DATA: 60 y/o F; status post fall with right shoulder pain.

EXAM:
RIGHT SHOULDER - 2+ VIEW

[Series 1: shoulder grashey · 0.14mm/px · 2 of 2 slices shown]
[im 1/2]
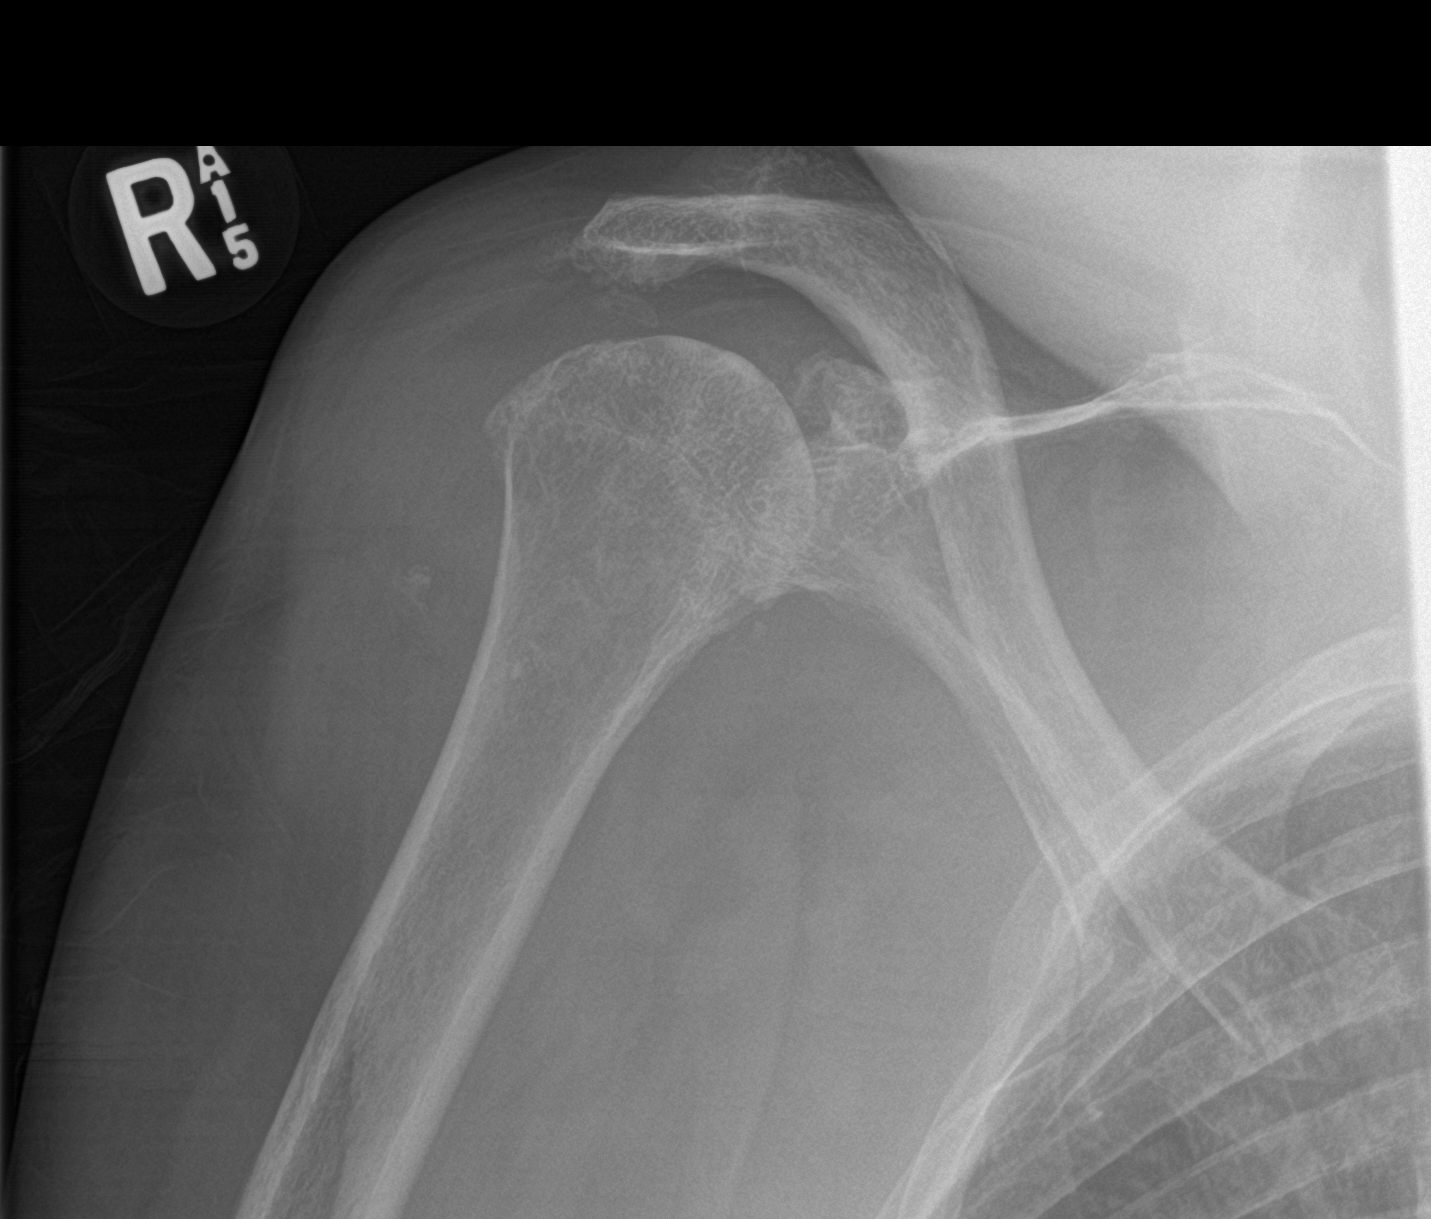
[im 2/2]
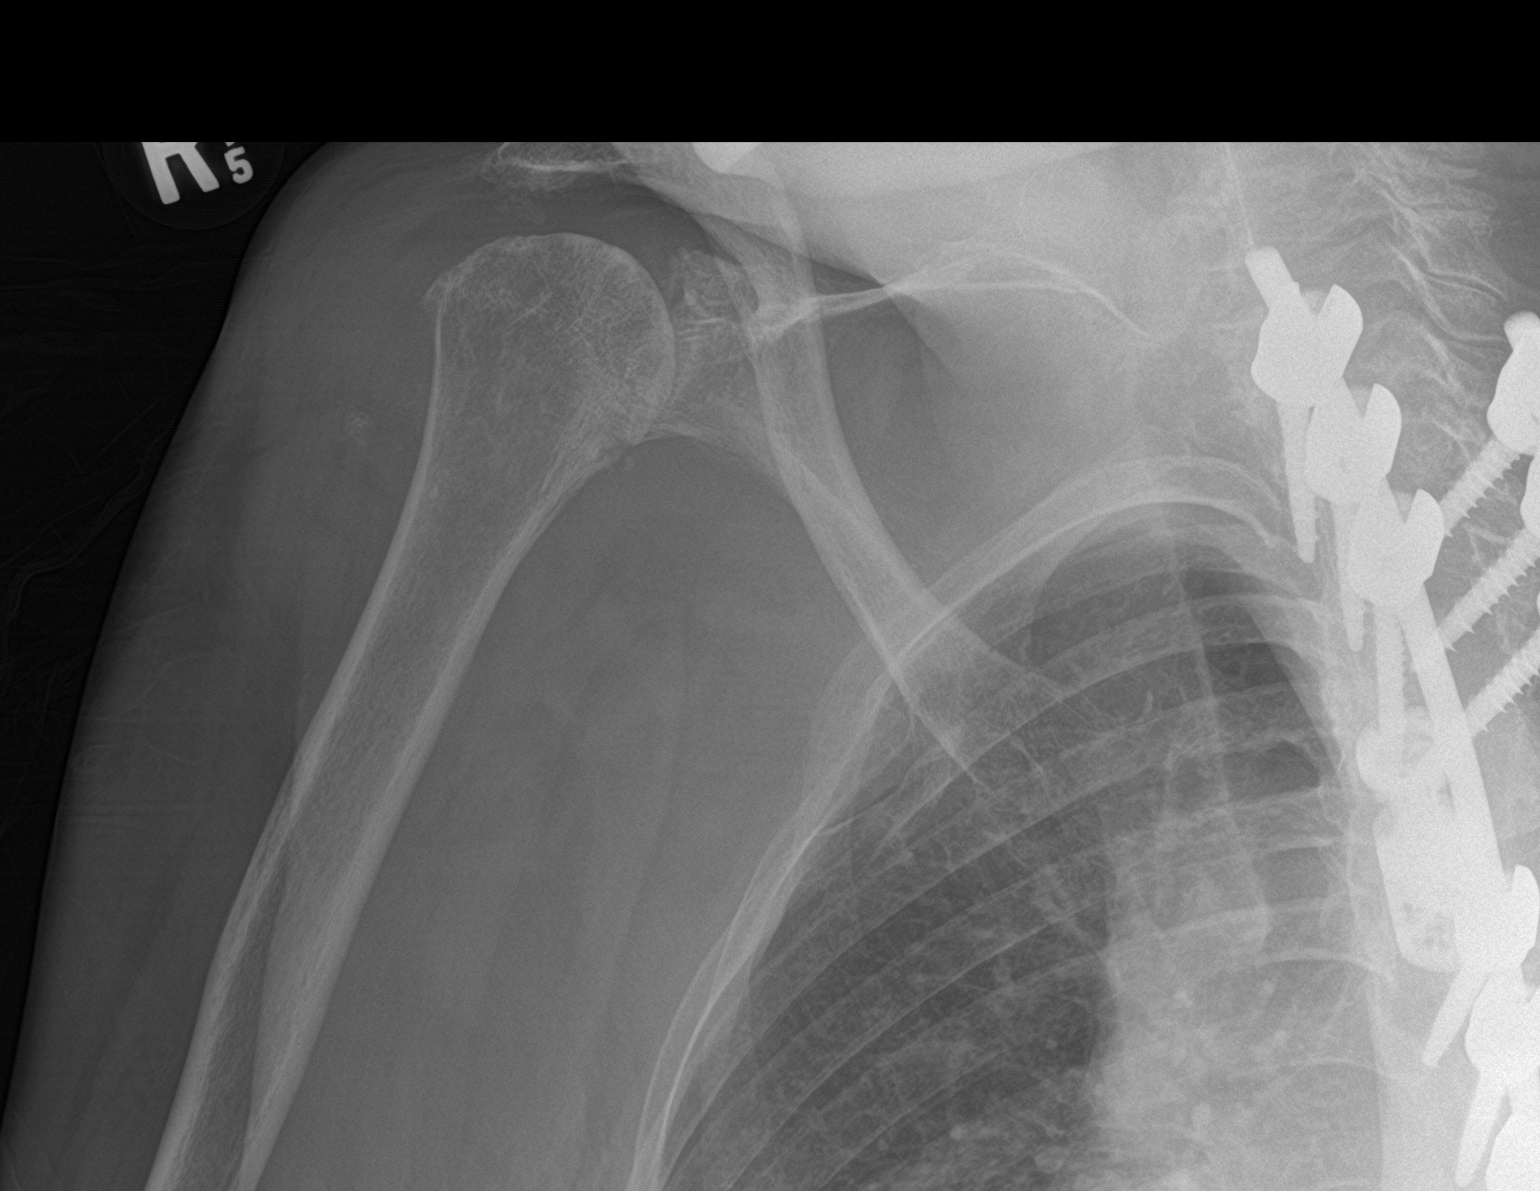

[shoulder y view]
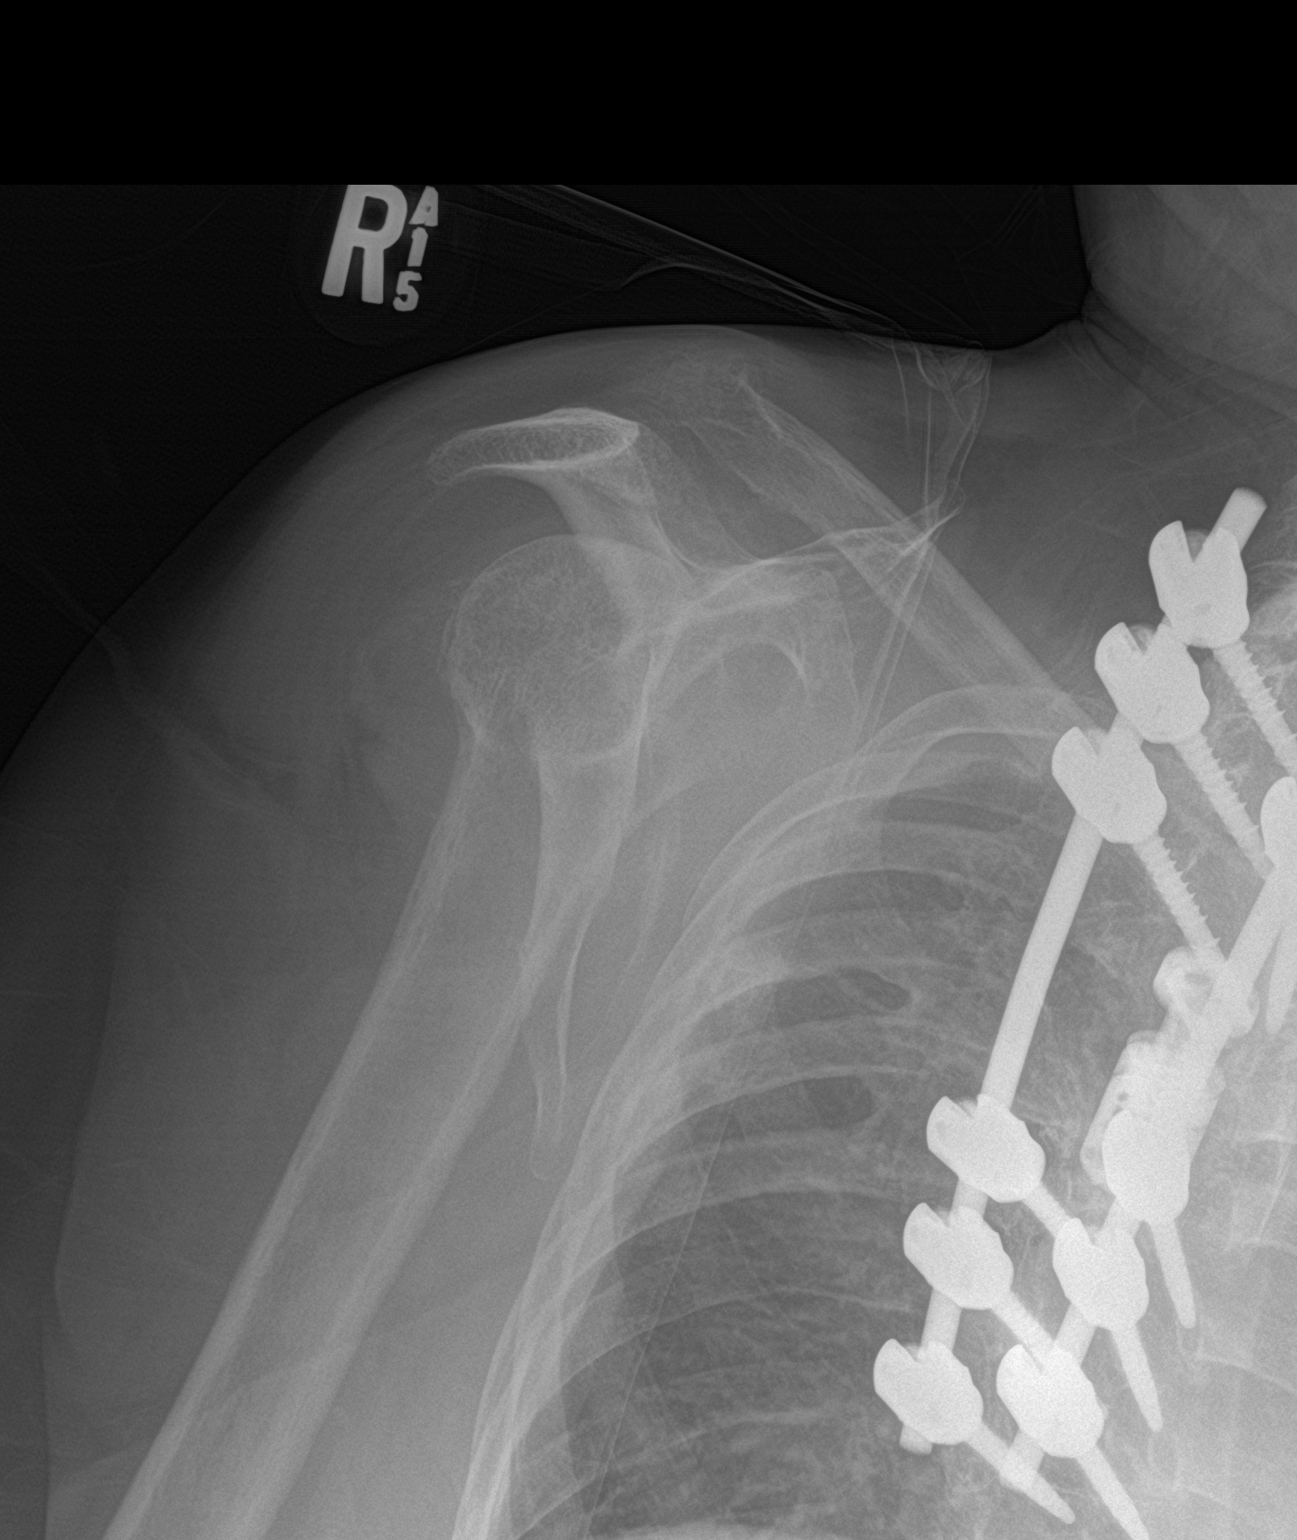

[3 of 3 positions shown; findings below may reference images not displayed]

FINDINGS: The humeral head is seated on the glenoid. There is deformity of the
glenoid rim most pronounced superiorly probably representing
impacted fracture and there are small calcific densities surrounding
the proximal humerus which may represent intra-articular bodies.
Clavicle is obscured by the mandible. Chronic right upper posterior
lateral rib fractures.
IMPRESSION: Deformity of glenoid rim most pronounced superiorly may represent an
impacted fracture. No dislocation. Ossific densities surrounding
proximal humerus may represent intra-articular bodies.

By: Savio Locklear M.D.

## 2016-12-13 IMAGING — NM NM PULMONARY VENT & PERF
16 series · 16 of 16 positions shown · non-contrast
Comparison: Chest x-ray today and chest CT 05/04/2016

CLINICAL DATA: Shortness of breath.

EXAM:
NUCLEAR MEDICINE VENTILATION - PERFUSION LUNG SCAN
TECHNIQUE: Ventilation images were obtained in multiple projections using
inhaled aerosol Vc-LLm DTPA. Perfusion images were obtained in
multiple projections after intravenous injection of Vc-LLm MAA.
RADIOPHARMACEUTICALS:  31.6 mCi Gechnetium-00m DTPA aerosol
inhalation and 4.38 mCi Gechnetium-00m MAA IV

[Series 1: ant/post vent · 4.14mm/px · 1 of 1 slices shown (1 of 2)]
[im 1/1]
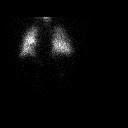

[Series 1: ant/post vent · 4.14mm/px · 1 of 1 slices shown (2 of 2)]
[im 1/1]
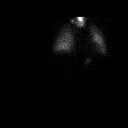

[Series 2: lao/rpo vent · 4.14mm/px · 1 of 1 slices shown (1 of 2)]
[im 1/1]
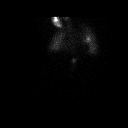

[Series 2: lao/rpo vent · 4.14mm/px · 1 of 1 slices shown (2 of 2)]
[im 1/1  full-range]
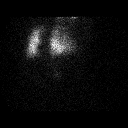

[Series 3: lpo/rao vent · 4.14mm/px · 1 of 1 slices shown (1 of 2)]
[im 1/1]
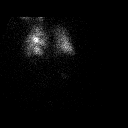

[Series 3: lpo/rao vent · 4.14mm/px · 1 of 1 slices shown (2 of 2)]
[im 1/1]
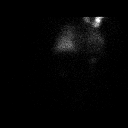

[Series 4: lt lat/rt lat vent · 4.14mm/px · 1 of 1 slices shown (1 of 2)]
[im 1/1]
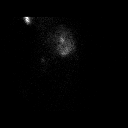

[Series 4: lt lat/rt lat vent · 4.14mm/px · 1 of 1 slices shown (2 of 2)]
[im 1/1]
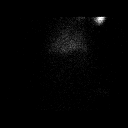

[Series 5: lt lat/rt lat perf · 4.14mm/px · 1 of 1 slices shown (1 of 2)]
[im 1/1]
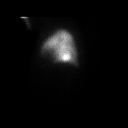

[Series 5: lt lat/rt lat perf · 4.14mm/px · 1 of 1 slices shown (2 of 2)]
[im 1/1]
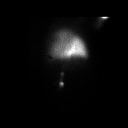

[Series 6: lpo/rao perf · 4.14mm/px · 1 of 1 slices shown (1 of 2)]
[im 1/1]
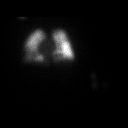

[Series 6: lpo/rao perf · 4.14mm/px · 1 of 1 slices shown (2 of 2)]
[im 1/1]
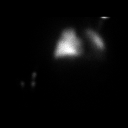

[Series 7: ant/post perf · 4.14mm/px · 1 of 1 slices shown (1 of 2)]
[im 1/1]
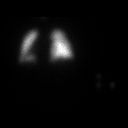

[Series 7: ant/post perf · 4.14mm/px · 1 of 1 slices shown (2 of 2)]
[im 1/1]
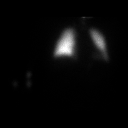

[Series 8: lao/rpo perf · 4.14mm/px · 1 of 1 slices shown (1 of 2)]
[im 1/1]
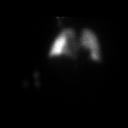

[Series 8: lao/rpo perf · 4.14mm/px · 1 of 1 slices shown (2 of 2)]
[im 1/1]
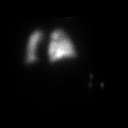

[16 of 16 positions shown; findings below may reference images not displayed]

FINDINGS: Ventilation: No focal ventilation defect.

Perfusion: No wedge shaped peripheral perfusion defects to suggest
acute pulmonary embolism.
IMPRESSION: Normal VQ scan without evidence of pulmonary embolism.

## 2016-12-13 IMAGING — CR DG CHEST 2V
2 series · 2 of 2 positions shown · non-contrast
Comparison: 08/22/2016

CLINICAL DATA: Stroke 2 days ago

EXAM:
CHEST  2 VIEW

[chest lat]
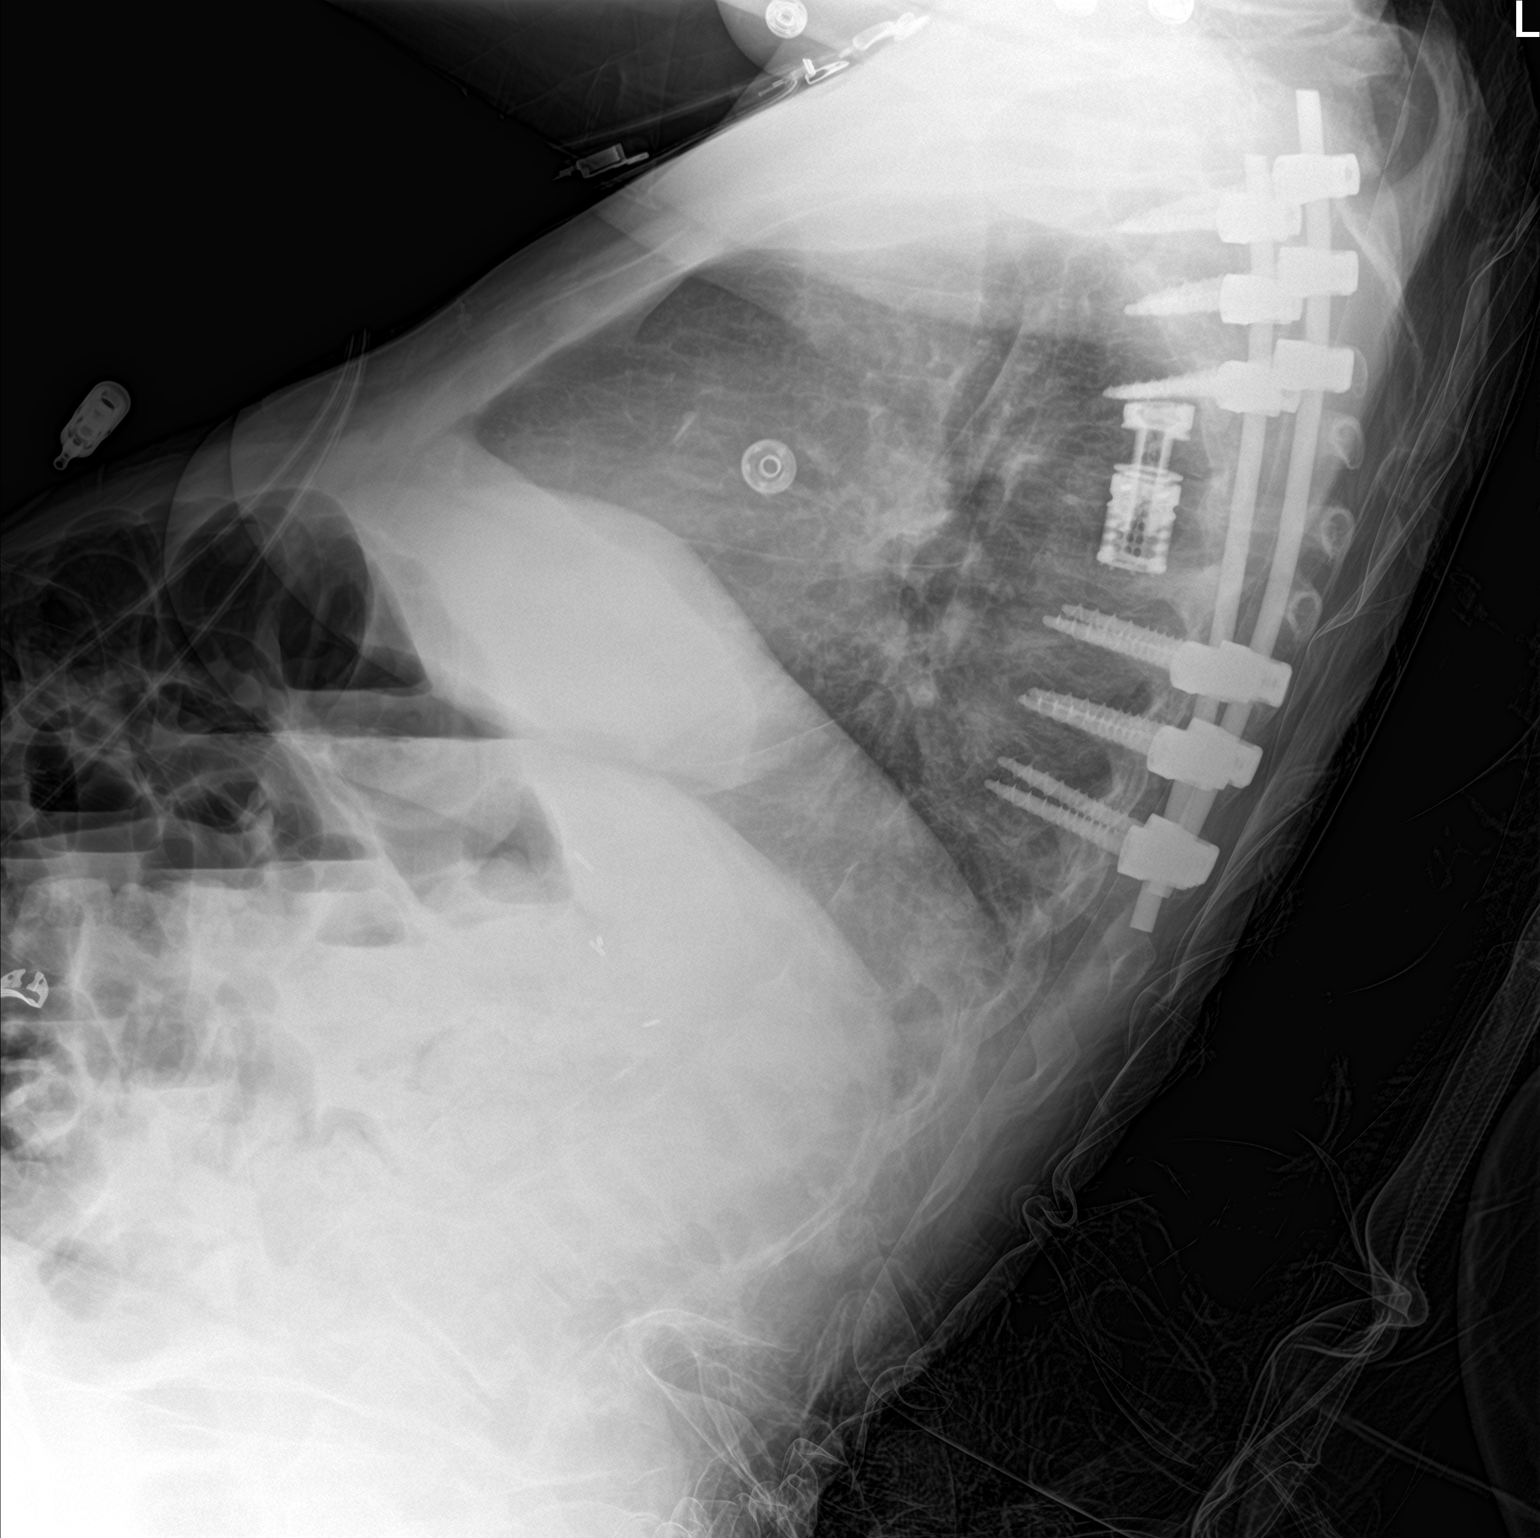

[chest ap]
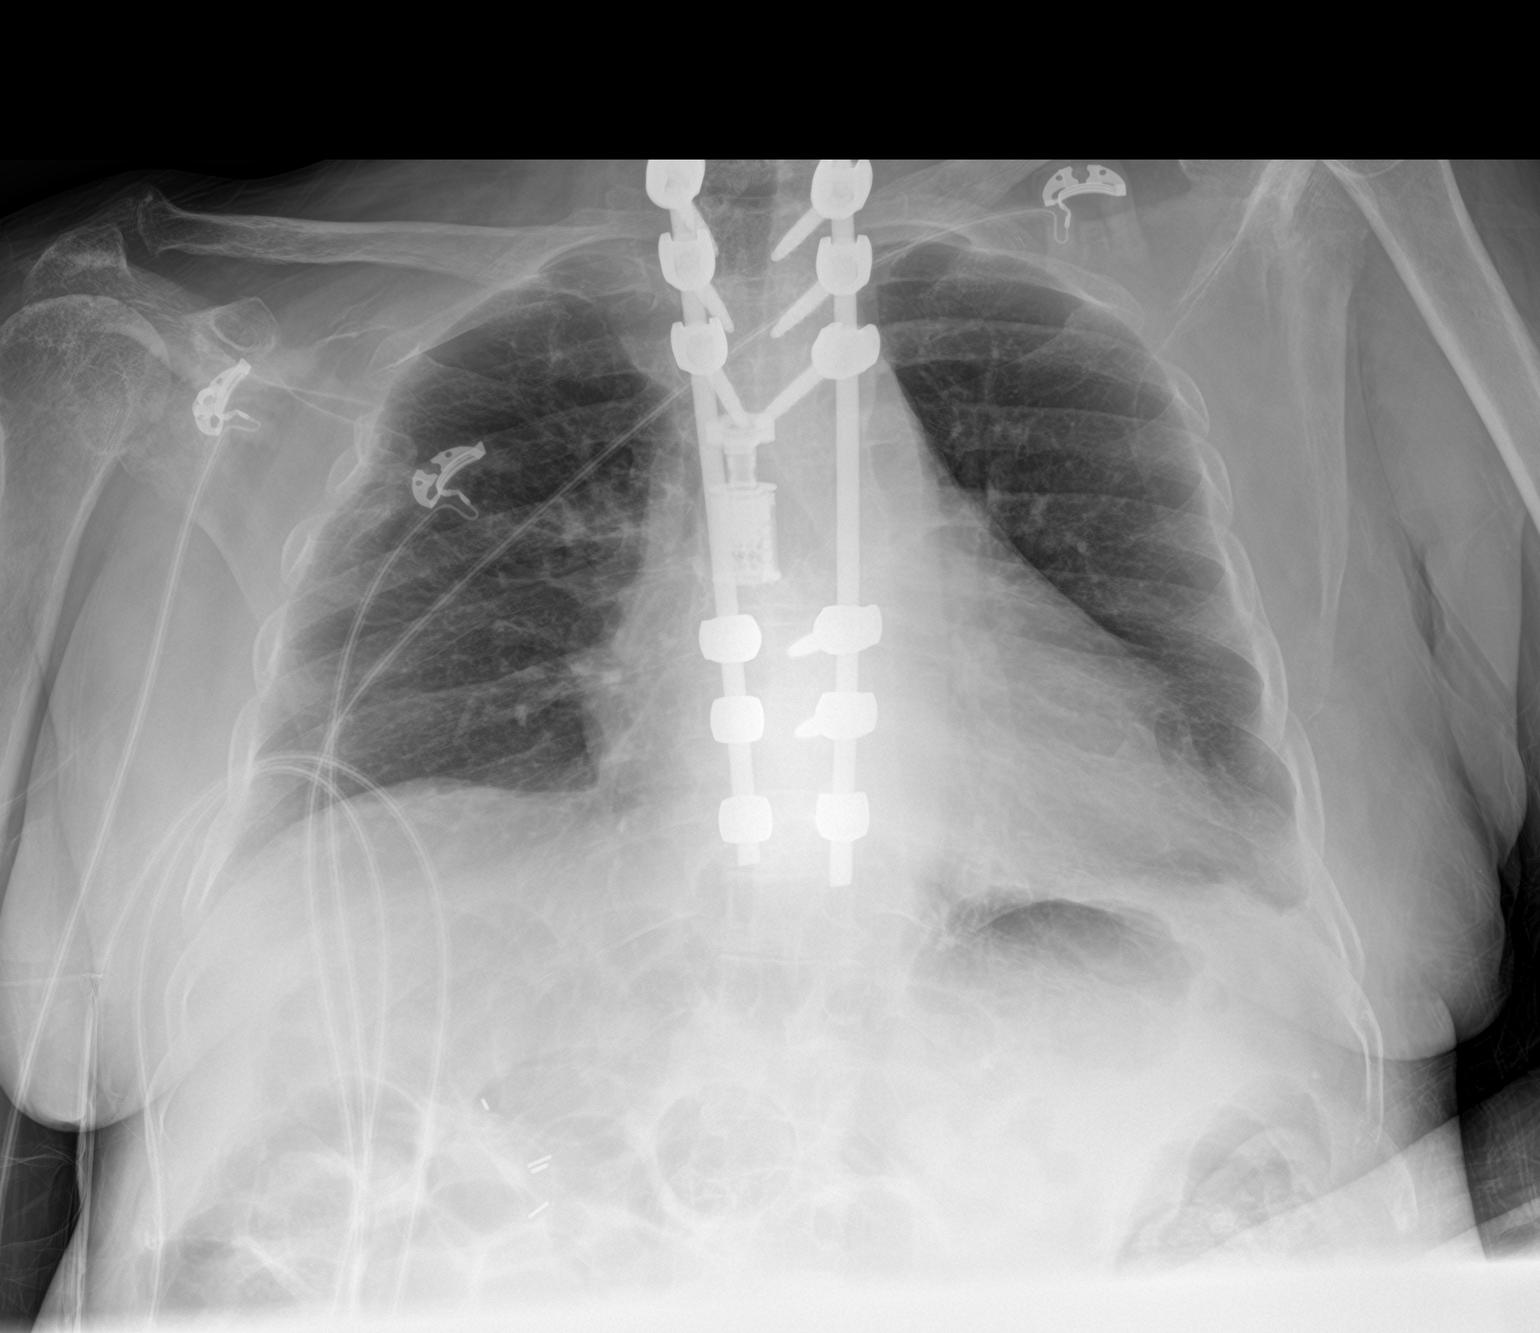

[2 of 2 positions shown; findings below may reference images not displayed]

FINDINGS: Cardiomegaly again noted. No infiltrate or pleural effusion. No
pulmonary edema. Stable left basilar scarring. Metallic fixation
rods thoracic spine again noted. Degenerative changes bilateral
shoulders.
IMPRESSION: No active cardiopulmonary disease. Cardiomegaly again noted. Stable
left basilar scarring.

## 2016-12-18 ENCOUNTER — Encounter (HOSPITAL_COMMUNITY): Payer: Self-pay

## 2016-12-18 ENCOUNTER — Emergency Department (HOSPITAL_COMMUNITY)
Admission: EM | Admit: 2016-12-18 | Discharge: 2016-12-18 | Disposition: A | Discharge status: Home / Self Care | Payer: Medicaid Other | Attending: Emergency Medicine | Admitting: Emergency Medicine

## 2016-12-18 ENCOUNTER — Emergency Department (HOSPITAL_COMMUNITY): Payer: Medicaid Other

## 2016-12-18 DIAGNOSIS — Z79899 Other long term (current) drug therapy: Secondary | ICD-10-CM | POA: Insufficient documentation

## 2016-12-18 DIAGNOSIS — K2100 Gastro-esophageal reflux disease with esophagitis, without bleeding: Secondary | ICD-10-CM

## 2016-12-18 DIAGNOSIS — E119 Type 2 diabetes mellitus without complications: Secondary | ICD-10-CM | POA: Insufficient documentation

## 2016-12-18 DIAGNOSIS — I10 Essential (primary) hypertension: Secondary | ICD-10-CM | POA: Diagnosis not present

## 2016-12-18 DIAGNOSIS — J45909 Unspecified asthma, uncomplicated: Secondary | ICD-10-CM | POA: Diagnosis not present

## 2016-12-18 DIAGNOSIS — Z87891 Personal history of nicotine dependence: Secondary | ICD-10-CM | POA: Diagnosis not present

## 2016-12-18 DIAGNOSIS — Z8673 Personal history of transient ischemic attack (TIA), and cerebral infarction without residual deficits: Secondary | ICD-10-CM | POA: Insufficient documentation

## 2016-12-18 DIAGNOSIS — K219 Gastro-esophageal reflux disease without esophagitis: Secondary | ICD-10-CM | POA: Insufficient documentation

## 2016-12-18 DIAGNOSIS — R079 Chest pain, unspecified: Secondary | ICD-10-CM | POA: Diagnosis present

## 2016-12-18 DIAGNOSIS — Z7982 Long term (current) use of aspirin: Secondary | ICD-10-CM | POA: Diagnosis not present

## 2016-12-18 DIAGNOSIS — R0789 Other chest pain: Secondary | ICD-10-CM

## 2016-12-18 LAB — COMPREHENSIVE METABOLIC PANEL
ALT: 19 U/L (ref 14–54)
AST: 49 U/L — AB (ref 15–41)
Albumin: 2.6 g/dL — ABNORMAL LOW (ref 3.5–5.0)
Alkaline Phosphatase: 93 U/L (ref 38–126)
Anion gap: 10 (ref 5–15)
BUN: 15 mg/dL (ref 6–20)
CHLORIDE: 111 mmol/L (ref 101–111)
CO2: 19 mmol/L — AB (ref 22–32)
CREATININE: 0.88 mg/dL (ref 0.44–1.00)
Calcium: 8.4 mg/dL — ABNORMAL LOW (ref 8.9–10.3)
GFR calc Af Amer: >60 mL/min (ref >60)
GFR calc non Af Amer: >60 mL/min (ref >60)
Glucose, Bld: 86 mg/dL (ref 65–99)
Potassium: 4.3 mmol/L (ref 3.5–5.1)
SODIUM: 140 mmol/L (ref 135–145)
Total Bilirubin: 0.8 mg/dL (ref 0.3–1.2)
Total Protein: 5.4 g/dL — ABNORMAL LOW (ref 6.5–8.1)

## 2016-12-18 LAB — MAGNESIUM: Magnesium: 1.5 mg/dL — ABNORMAL LOW (ref 1.7–2.4)

## 2016-12-18 LAB — CBC WITH DIFFERENTIAL/PLATELET
BASOS ABS: 0 10*3/uL (ref 0.0–0.1)
BASOS PCT: 0 %
EOS ABS: 0.1 10*3/uL (ref 0.0–0.7)
EOS PCT: 1 %
HCT: 30.7 % — ABNORMAL LOW (ref 36.0–46.0)
Hemoglobin: 10 g/dL — ABNORMAL LOW (ref 12.0–15.0)
Lymphocytes Relative: 34 %
Lymphs Abs: 3.5 10*3/uL (ref 0.7–4.0)
MCH: 24.8 pg — AB (ref 26.0–34.0)
MCHC: 32.6 g/dL (ref 30.0–36.0)
MCV: 76.2 fL — ABNORMAL LOW (ref 78.0–100.0)
Monocytes Absolute: 0.7 10*3/uL (ref 0.1–1.0)
Monocytes Relative: 7 %
Neutro Abs: 5.8 10*3/uL (ref 1.7–7.7)
Neutrophils Relative %: 57 %
PLATELETS: 234 10*3/uL (ref 150–400)
RBC: 4.03 MIL/uL (ref 3.87–5.11)
RDW: 15.1 % (ref 11.5–15.5)
WBC: 10.1 10*3/uL (ref 4.0–10.5)

## 2016-12-18 LAB — LIPASE, BLOOD: Lipase: 16 U/L (ref 11–51)

## 2016-12-18 LAB — I-STAT TROPONIN, ED: TROPONIN I, POC: 0.03 ng/mL (ref 0.00–0.08)

## 2016-12-18 MED ORDER — SUCRALFATE 1 G PO TABS: 1.0000 g | ORAL_TABLET | Freq: Three times a day (TID) | ORAL | 1 refills | Status: DC

## 2016-12-18 MED ORDER — ONDANSETRON HCL 4 MG/2ML IJ SOLN
4.0000 mg | Freq: Once | INTRAMUSCULAR | Status: AC
  Administered 2016-12-18: 4 mg via INTRAVENOUS
  Filled 2016-12-18: qty 2

## 2016-12-18 MED ORDER — MAGNESIUM OXIDE 400 MG PO CAPS: 400.0000 mg | ORAL_CAPSULE | Freq: Two times a day (BID) | ORAL | 0 refills | Status: AC

## 2016-12-18 MED ORDER — SODIUM CHLORIDE 0.9 % IV BOLUS (SEPSIS)
500.0000 mL | Freq: Once | INTRAVENOUS | Status: AC
  Administered 2016-12-18: 500 mL via INTRAVENOUS

## 2016-12-18 MED ORDER — ACETAMINOPHEN 325 MG PO TABS
650.0000 mg | ORAL_TABLET | Freq: Once | ORAL | Status: AC
  Administered 2016-12-18: 650 mg via ORAL
  Filled 2016-12-18: qty 2

## 2016-12-18 MED ORDER — GI COCKTAIL ~~LOC~~
30.0000 mL | Freq: Once | ORAL | Status: AC
  Administered 2016-12-18: 30 mL via ORAL
  Filled 2016-12-18: qty 30

## 2016-12-18 NOTE — ED Provider Notes (Signed)
[facility]-EMERGENCY DEPT Provider Note   CSN: [id number] Arrival date & time: 12/18/16  7829     History   Chief Complaint Chief Complaint  Patient presents with  . Chest Pain  . Numbness    HPI Amber [name] is a 61 y.o. female.  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 She states she had significant chest pain last night with lying down that she describes as a burning sensation that goes into her neck. It returned again this morning. It does not seem to be related to activity and does not cause shortness of breath. She was nauseated this morning with one episode of vomiting. She has felt hot" but no documented fever. She denies any lower extremity swelling or new weakness. She continues to have pain in her right shoulder. She denies  any new weakness or numbness. She has an occasional cough but no persistent cough consistent with URI symptoms.   The history is provided by the patient and the EMS personnel.  Chest Pain   This is a recurrent problem. Associated symptoms include malaise/fatigue and nausea. Pertinent negatives include no palpitations, no shortness of breath and no syncope. She has tried nothing for the symptoms. The treatment provided no relief.  Her past medical history is significant for diabetes and hypertension.  Pertinent negatives for past medical history include no MI and no PE.  Procedure history is positive for echocardiogram and stress thallium.    Past Medical History:  Diagnosis Date  . Arthritis   . Asthma   . Chronic pain   .  Constipation   . Diabetes mellitus without complication (HCC)   . Embolic stroke (HCC)   . Falls frequently   . GERD (gastroesophageal reflux disease)   . Hypertension   . Pneumonia 10/2015  . Seizures [facility])    last seizure March 2015    Patient Active Problem List   Diagnosis Date Noted  . Altered mental status   . Chest pain 11/23/2016  . CVA (cerebral vascular accident) (HCC) 10/28/2016  . Tachycardia   . Seizure (HCC) 10/26/2016  . Embolic stroke (HCC) 10/26/2016  . New onset atrial fibrillation (HCC) 10/26/2016  . 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 Focal seizure (HCC)   . Shoulder pain, right   . Duodenitis   . Abdominal pain 11/07/2015  . Constipation 11/07/2015  . Abdominal distension   . Chest pain, musculoskeletal 08/24/2015  . Diabetes mellitus type 2, controlled (HCC) 07/27/2015  . Chronic back pain   . Frequent falls   . Essential hypertension   . Pulmonary hypertension   . Gout   . Asthma 06/12/2015  . ACS (acute coronary syndrome) (HCC) 06/12/2015  . Microcytic anemia 01/26/2015  . History of seizures 01/26/2015    Past Surgical History:  Procedure Laterality Date  . APPENDECTOMY    . BACK SURGERY    . CHOLECYSTECTOMY    . TEE WITHOUT CARDIOVERSION N/A 10/30/2016   Procedure: TRANSESOPHAGEAL ECHOCARDIOGRAM (TEE);  Surgeon: Lars Masson, MD;  Location: [facility] ENDOSCOPY;  Service: Cardiovascular;  Laterality: N/A;    OB History    No data available       Home Medications    Prior to Admission medications   Medication Sig Start Date End Date Taking? Authorizing Provider  albuterol (PROVENTIL HFA;VENTOLIN HFA) 108 (90 BASE) MCG/ACT inhaler Inhale 2 puffs into the lungs every 6 (six) hours as needed for wheezing or shortness of breath.    Historical Provider, MD  allopurinol (ZYLOPRIM) 300 MG tablet Take 300 mg by mouth daily. 11/11/16   Historical Provider, MD  amLODipine (NORVASC) 5 MG tablet Take 5 mg by mouth  daily.    Historical Provider, MD  aspirin EC 325 MG EC tablet Take 1 tablet (325 mg total) by mouth daily. 10/31/16   Richarda Overlie, MD  atorvastatin (LIPITOR) 10 MG tablet Take 10 mg by mouth daily. 08/13/15   Historical Provider, MD  diazepam (VALIUM) 5 MG tablet Take 0.5 tablets (2.5 mg total) by mouth 2 (two) times daily. 10/31/16   Richarda Overlie, MD  feeding supplement, ENSURE ENLIVE, (ENSURE ENLIVE) LIQD Take 237 mLs by mouth 2 (two) times daily between meals. 11/10/15   Catarina Hartshorn, MD  Fluticasone-Salmeterol (ADVAIR) 250-50 MCG/DOSE AEPB Inhale 1 puff into the lungs 2 (two) times daily.    Historical Provider, MD  folic acid (FOLVITE) 1 MG tablet Take 1 mg by mouth daily.    Historical Provider, MD  gabapentin (NEURONTIN) 300 MG capsule Take 1 capsule (300 mg total) by mouth 2 (two) times daily. 06/14/15   Richarda Overlie, MD  lacosamide 100 MG TABS Take 1 tablet (100 mg total) by mouth 2 (two) times daily. 10/31/16   Richarda Overlie, MD  lactulose (CHRONULAC) 10 GM/15ML solution Take 30 mLs (20 g total) by mouth 2 (two) times daily as needed for severe constipation. 08/22/16   Jacalyn Lefevre, MD  LINZESS 145 MCG CAPS capsule Take 1 capsule (145 mcg total) by mouth daily as needed (constipation). 05/26/16   Penny Pia, MD  Magnesium Oxide 400 MG CAPS Take 1 capsule (400 mg total) by mouth 2 (two) times daily. 11/25/16 12/25/16  Richarda Overlie, MD  metoprolol succinate (TOPROL-XL) 50 MG 24 hr tablet Take 1 tablet (50 mg total) by mouth daily. 11/25/16 12/25/16  Richarda Overlie, MD  oxyCODONE (OXY IR/ROXICODONE) 5 MG immediate release tablet Take 20 mg by mouth 4 (four) times daily as needed for severe pain.     Historical Provider, MD  pantoprazole (PROTONIX) 40 MG tablet Take 1 tablet (40 mg total) by mouth 2 (two) times daily before a meal. 12/25/15   Ripudeep K Rai, MD  polyethylene glycol (MIRALAX / GLYCOLAX) packet Take 17 g by mouth 2 (two) times daily.    Historical Provider, MD  potassium chloride 20 MEQ TBCR  Take 40 mEq by mouth daily. 11/25/16 12/25/16  Richarda Overlie, MD  predniSONE (DELTASONE) 10 MG tablet Take 15 mg by mouth daily with breakfast.    Historical Provider, MD  promethazine (PHENERGAN) 25 MG tablet Take 1 tablet by mouth every 6 (six) hours as needed for nausea or vomiting. nausea 10/06/15   Historical Provider, MD  torsemide (DEMADEX) 10 MG tablet Take 10 mg by mouth daily.    Historical Provider, MD  Vitamin D, Ergocalciferol, (DRISDOL) 50000 UNITS CAPS capsule Take 1 capsule (50,000 Units total) by mouth every 7 (seven) days. Sunday Patient taking differently: Take 50,000 Units by mouth every 7 (seven) days. Monday 01/27/15   Calvert Cantor, MD    Family History Family  History  Problem Relation Age of Onset  . Kidney failure Mother   . Hypertension Sister   . Hypertension Brother     Social History Social History  Substance Use Topics  . Smoking status: Former Smoker    Quit date: 2010  . Smokeless tobacco: Never Used  . Alcohol use No     Comment: admits to 2 drinks/week     Allergies   Ivp dye [iodinated diagnostic agents]   Review of Systems Review of Systems  Constitutional: Positive for malaise/fatigue.  Respiratory: Negative for shortness of breath.   Cardiovascular: Positive for chest pain. Negative for palpitations and syncope.  Gastrointestinal: Positive for nausea.  All other systems reviewed and are negative.    Physical Exam Updated Vital Signs BP 107/79   Pulse 107   Temp 98.3 F (36.8 C) (Oral)   Resp 11   SpO2 100%   Physical Exam  Constitutional: She is oriented to person, place, and time. She appears well-developed and well-nourished. No distress.  HENT:  Head: Normocephalic and atraumatic.  Mouth/Throat: Mucous membranes are dry.  Eyes: Conjunctivae and EOM are normal. Pupils are equal, round, and reactive to light.  Neck: Normal range of motion. Neck supple.  Cardiovascular: Regular rhythm and intact distal pulses.  Tachycardia  present.   No murmur heard. Pulmonary/Chest: Effort normal and breath sounds normal. No respiratory distress. She has no wheezes. She has no rales.  Abdominal: Soft. She exhibits no distension. There is tenderness. There is no rebound and no guarding.   Minimal epigastric tenderness  Musculoskeletal: She exhibits tenderness. She exhibits no edema.       Right shoulder: She exhibits decreased range of motion, tenderness and bony tenderness. She exhibits no swelling and no deformity.  Severe tenderness with ranging of the right shoulder  Neurological: She is alert and oriented to person, place, and time. No cranial nerve deficit or sensory deficit.  Mild decreased strength on foot flexion/extension on the left (chronic).  Normal  5/5 strength in bilateral  Upper ext.  No pronator drift  Skin: Skin is warm and dry. Capillary refill takes less than 2 seconds. No rash noted. No erythema.  Psychiatric: She has a normal mood and affect. Her behavior is normal.  Nursing note and vitals reviewed.    ED Treatments / Results  Labs (all labs ordered are listed, but only abnormal results are displayed) Labs Reviewed  CBC WITH DIFFERENTIAL/PLATELET - Abnormal; Notable for the following:       Result Value   Hemoglobin 10.0 (*)    HCT 30.7 (*)    MCV 76.2 (*)    MCH 24.8 (*)    All other components within normal limits  COMPREHENSIVE METABOLIC PANEL - Abnormal; Notable for the following:    CO2 19 (*)    Calcium 8.4 (*)    Total Protein 5.4 (*)    Albumin 2.6 (*)    AST 49 (*)    All other components within normal limits  MAGNESIUM - Abnormal; Notable for the following:    Magnesium 1.5 (*)    All other components within normal limits  LIPASE, BLOOD  I-STAT TROPOININ, ED    EKG  EKG Interpretation  Date/Time:  Monday December 18 2016 11:34:07 EST Ventricular Rate:  94 PR Interval:    QRS Duration: 60 QT Interval:  414 QTC Calculation: 518 R Axis:   35 Text Interpretation:  Sinus  rhythm Borderline short PR interval Borderline low voltage, extremity leads Prolonged  QT interval No significant change since last tracing Confirmed by [facility]  MD, Simone Tuckey ([id number]) on 12/18/2016 12:37:21 PM       Radiology Dg Chest 2 View  Result Date: 12/18/2016 CLINICAL DATA:  Chest pain since yesterday EXAM: CHEST  2 VIEW COMPARISON:  11/26/2016 FINDINGS: Borderline cardiomegaly, stable.  Normal mediastinal contours. Chronic blunting of the lateral left costophrenic sulcus, scarring based on 2017 chest CT. There is no edema, consolidation, effusion, or pneumothorax. Status post thoracic corpectomy and posterior fusion. T12, L1, and L2 compression fractures with stable height loss compared to prior. The fractures are chronic based on 07/01/2016 abdominal CT. Remote right rib fractures. IMPRESSION: No acute finding.  Stable from prior. Electronically Signed   By: Marnee Spring M.D.   On: 12/18/2016 10:23    Procedures Procedures (including critical care time)  Medications Ordered in ED Medications  ondansetron (ZOFRAN) injection 4 mg (not administered)  acetaminophen (TYLENOL) tablet 650 mg (not administered)  sodium chloride 0.9 % bolus 500 mL (not administered)     Initial Impression / Assessment and Plan / ED Course  I have reviewed the triage vital signs and the nursing notes.  Pertinent labs & imaging results that were available during my care of the patient were reviewed by me and considered in my medical decision making (see chart for details).    Patient is a 61 year old female with multiple medical problems presenting today with multiple vague complaints including a week of generalized aches and pains, hot and cold chills, intermittent loose stool, intermittent nausea and an episode of vomiting this morning, occasional cough but no shortness of breath. Also patient complains of chest pain that has been intermittent. She had an episode last night while lying down and is  currently having an episode which she describes as burning.  On exam patient has diffuse muscular tenderness with palpation as well as some mild chest wall tenderness. She has mild epigastric discomfort but no evidence of fluid overload. She is mildly tachycardic with normal blood pressure and oxygen saturation. Reviewing the chart patient was seen here 3 weeks ago for similar symptoms. At that time she was admitted for cardiac rule out. Significantly in patient's history she was admitted at the end of December for new embolic strokes found to be from a patent PFO. She does have a procedure scheduled for closure of her PFO and is currently on Plavix. She then was admitted 3 weeks ago for a burning type chest pain that is worse under stress and seems to be better when taking Nexium. She has had multiple falls at home because she was unhappy with rehabilitation and went home after one week. She has sustained multiple falls and has a known right glenoid fracture of the humerus. Patient denies any recent falls and is seems that she is not very independent at home at this time. She is undergoing increased stress with her family. She does admit to taking all of her prescribed medications but has not taken them this morning. She last took them yesterday. She continues to drink and sure but has a poor appetite. 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 Today will ensure no developing pneumonia, electrolyte abnormality as patient is known to have hypokalemia, EKG changes or  infectious etiology. CBC, CMP, lipase, troponin, BNP, UA, chest x-ray, EKG pending area patient given Tylenol, GI cocktail, zofran and IV fluids.  12:17 PM Labs wnl except for low magnesium.  Pt not  currently taking mag. Pt also not on plavix.  Is taking 325mg  ASA.  Will discuss with Dr. and find out if upcoming appt for PFO patch schedule. Pt feeling better after GI cocktail and will continue nexium and given carafate.  She has pain meds at home and f/u appt with [facility] on the 13th to address pain control. Feel pt is safe for d/c today.  Low suspicion sx are heart related.  12:51 PM Dr. 14 confirmed she does not need to be on plavix at this time and f/u appt with cards made.  Final Clinical Impressions(s) / ED Diagnoses   Final diagnoses:  Atypical chest pain  Gastroesophageal reflux disease with esophagitis    New Prescriptions Discharge Medication List as of 12/18/2016  1:09 PM    START taking these medications   Details  sucralfate (CARAFATE) 1 g tablet Take 1 tablet (1 g total) by mouth 4 (four) times daily -  with meals and at bedtime., Starting Mon 12/18/2016, Print         12/20/2016, MD 12/18/16 2159

## 2016-12-18 NOTE — ED Notes (Signed)
Pt returned from X-ray.  

## 2016-12-18 NOTE — ED Notes (Signed)
Papers reviewed with patient and she verbalizes understanding. sts she has a ride home and is ready to go home

## 2016-12-18 NOTE — ED Triage Notes (Signed)
Pt. CO of L sided chest pain reproducible with palpation. R sided burning headache that radiates into neck and pt. Experiencing hot and cold spells. She also CO of numbness in her left hand and both of her feet and legs for the past 3 days. Hy of stroke in December with some L sided residual weakness

## 2016-12-27 ENCOUNTER — Ambulatory Visit: Payer: Medicaid Other | Admitting: Cardiovascular Disease

## 2017-01-09 IMAGING — DX DG CHEST 2V
2 series · 2 of 2 positions shown · non-contrast
Comparison: PA and lateral chest x-ray October 27, 2016

CLINICAL DATA: Onset of chest pain last night. CVA 2 weeks ago.
History of asthma, former smoker.

EXAM:
CHEST  2 VIEW

[chest lat]
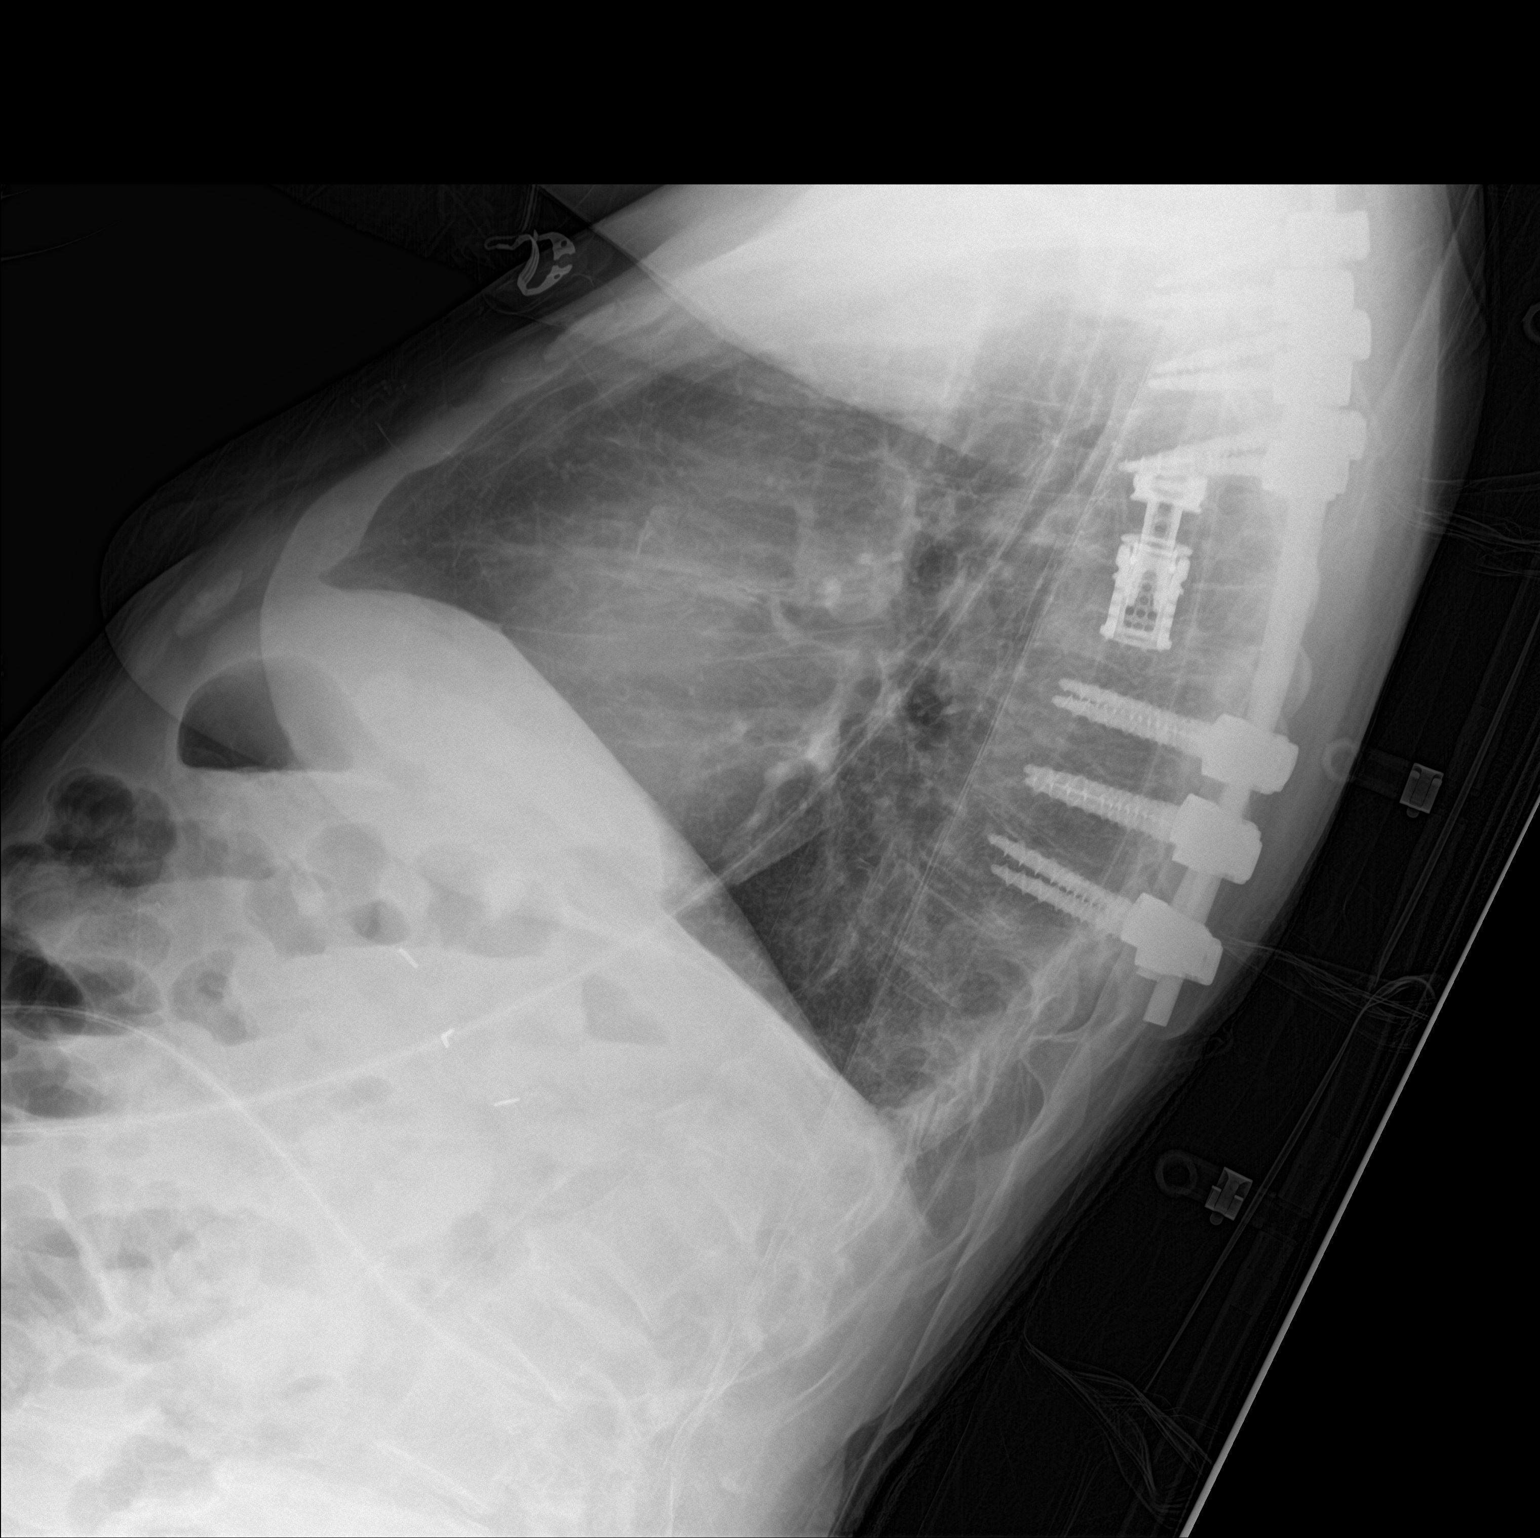

[chest ap strecther]
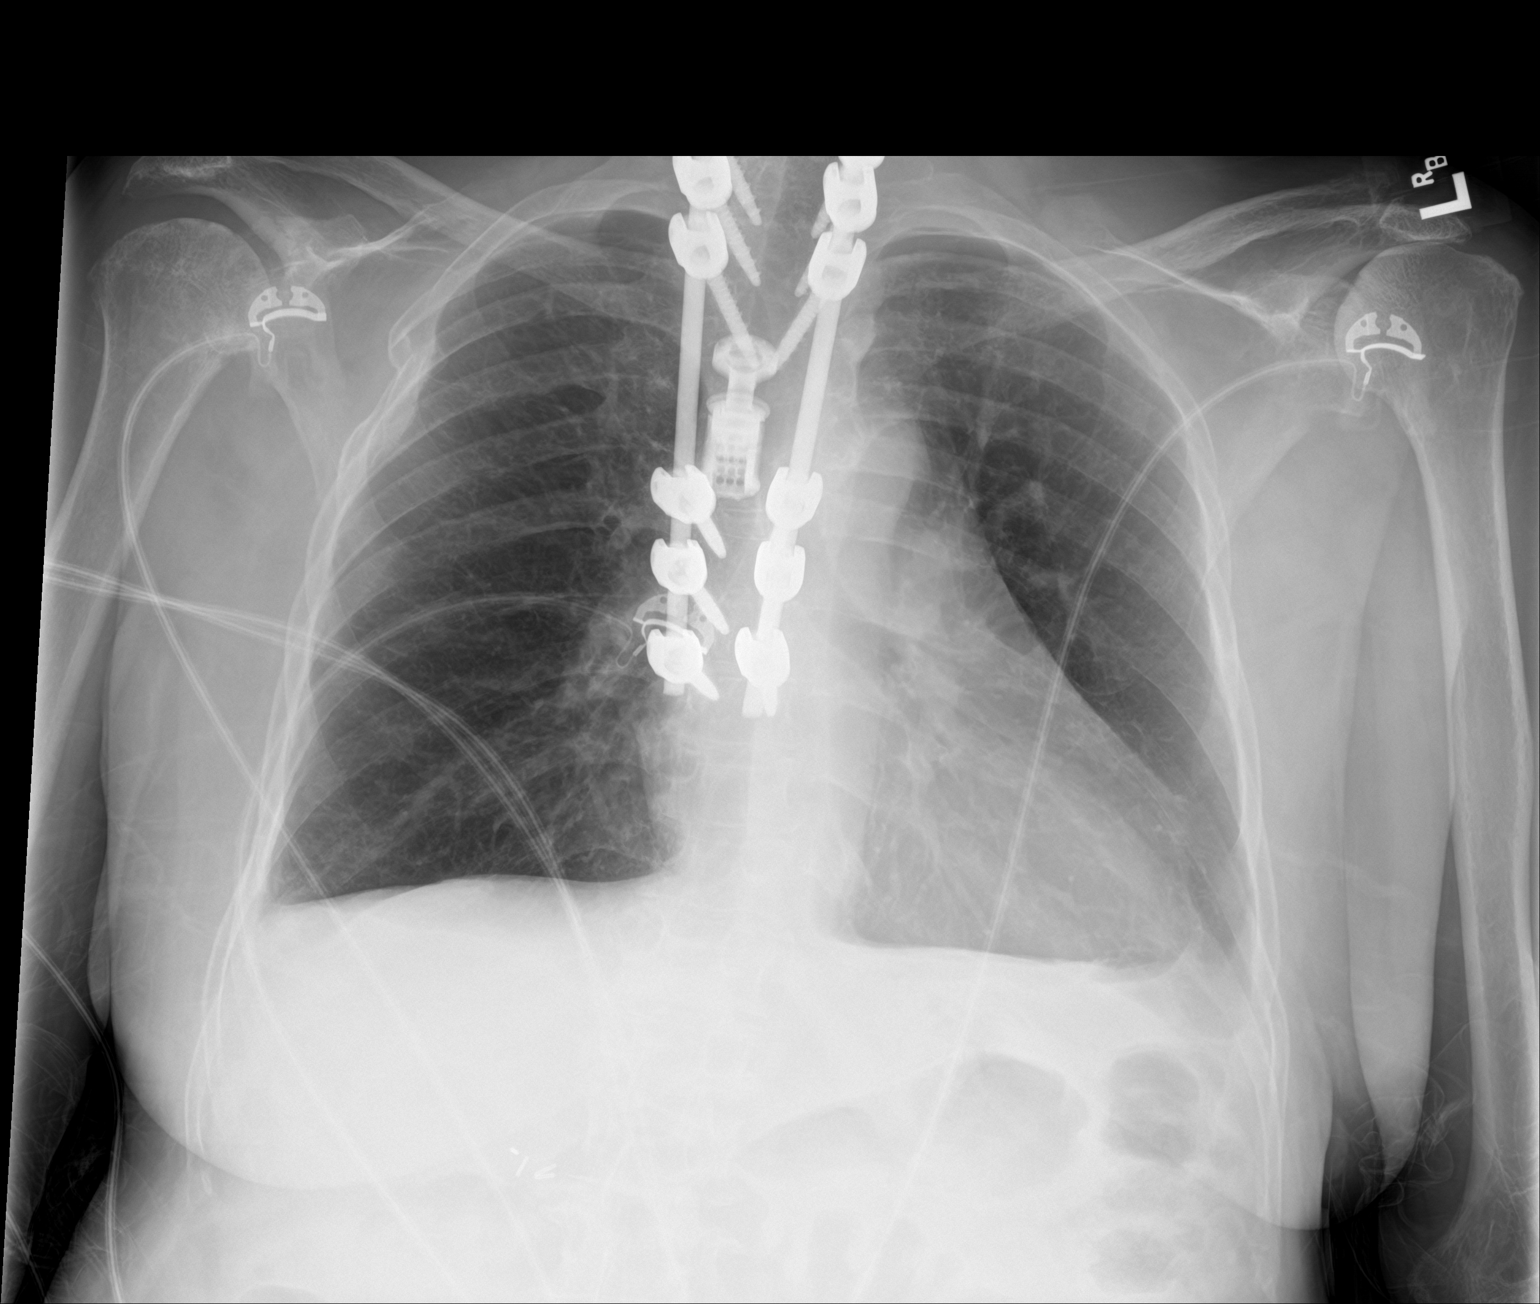

[2 of 2 positions shown; findings below may reference images not displayed]

FINDINGS: The lungs are mildly hyperinflated with hemidiaphragm flattening.
There is no significant pleural effusion. The cardiac silhouette is
top-normal in size. The pulmonary vascularity is normal. The patient
has undergone previous upper in mid posterior thoracic spinal
fusion. There old deformities of the lateral aspects of the right
third and fourth ribs.
IMPRESSION: There is no acute pneumonia nor CHF. Chronic bronchitic changes,
stable.

## 2017-01-09 IMAGING — DX DG SHOULDER 2+V*R*
2 series · 2 of 2 positions shown · non-contrast
Comparison: Plain films of the right shoulder 10/26/2016 and
05/19/2016.

CLINICAL DATA: Right shoulder pain since a fall 2 weeks ago.
Subsequent encounter.

EXAM:
RIGHT SHOULDER - 2+ VIEW

[shoulder grashey]
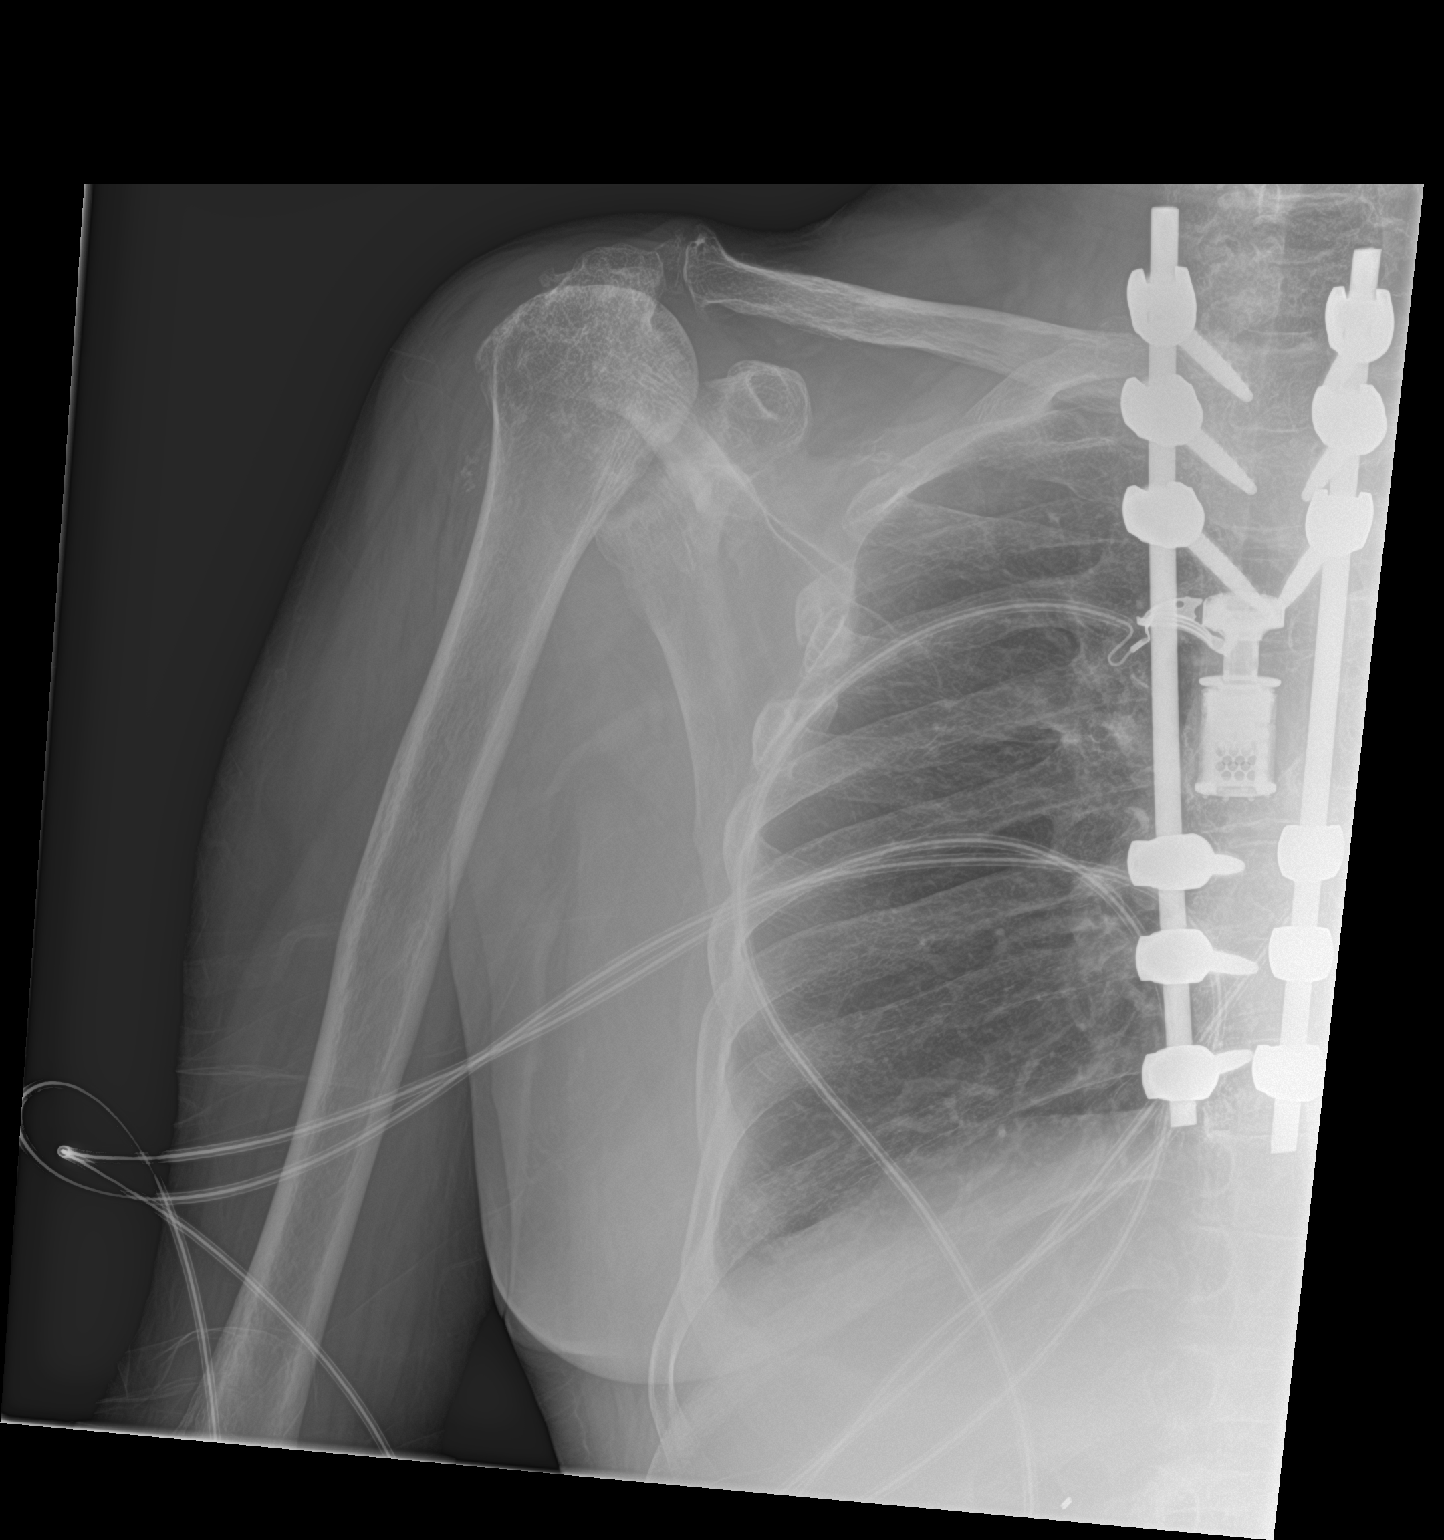

[shoulder y view]
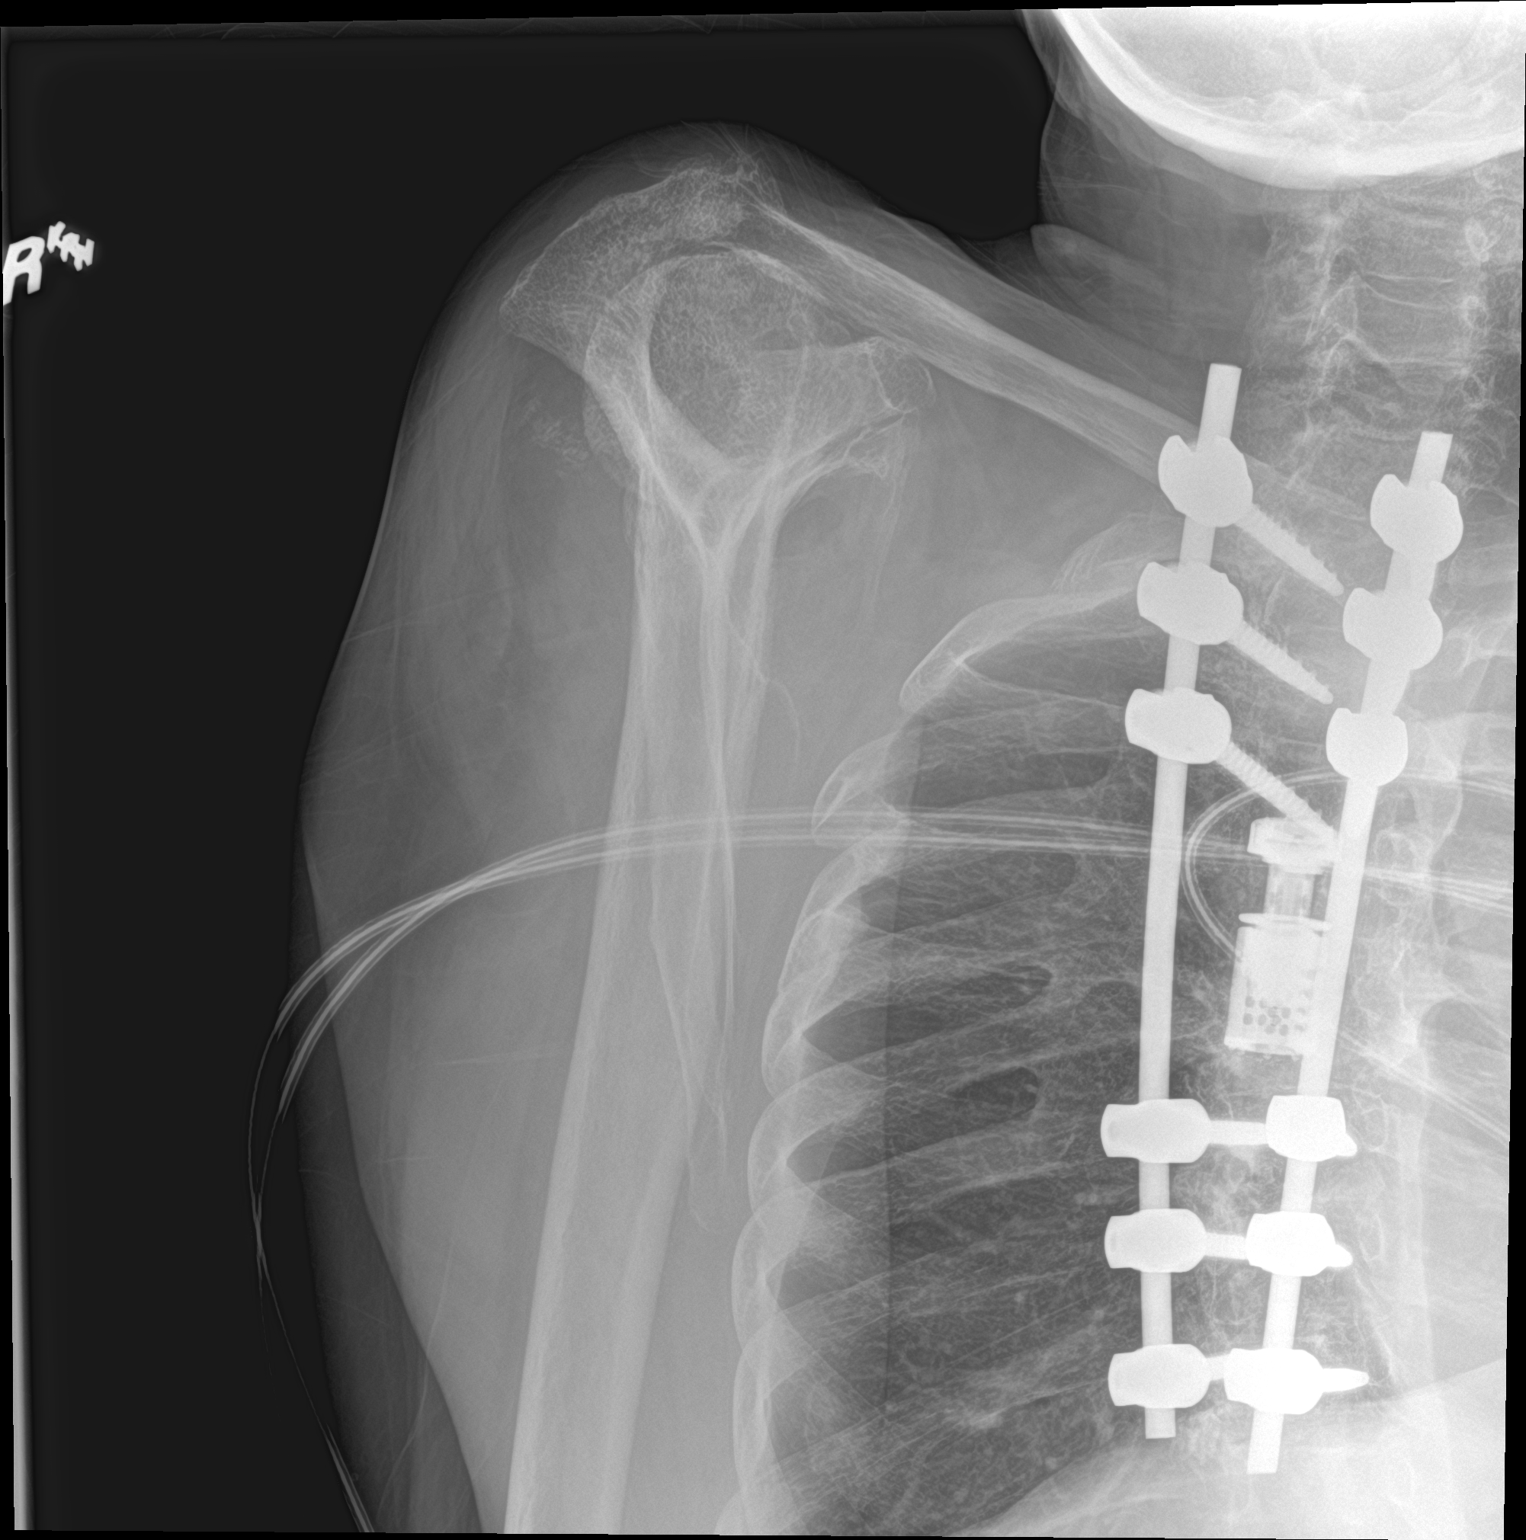

[2 of 2 positions shown; findings below may reference images not displayed]

FINDINGS: No fracture is identified. The humeral head is high-riding. There is
acromioclavicular osteoarthritis. The humerus is located.
Calcifications projecting along the posterior margin of the humeral
head on the Y-view may be due to calcific rotator cuff tendinopathy.
Spinal fusion hardware is noted
IMPRESSION: No fracture is identified on today's examination.

High-riding humeral head compatible with rotator cuff tear.

Acromioclavicular osteoarthritis.

## 2017-01-12 IMAGING — CT CT HEAD W/O CM
3 series · 15 of 47 positions shown, 18 images · non-contrast
Comparison: MRI brain 10/26/2016. CT head 10/26/2016, 02/28/2016
dating back to 01/26/2015.

CLINICAL DATA: 60-year-old with acute mental status changes,
confusion and inability to feed herself which began earlier today.

EXAM:
CT HEAD WITHOUT CONTRAST
TECHNIQUE: Contiguous axial images were obtained from the base of the skull
through the vertex without intravenous contrast.

[Series 2: head 5.0 h30s · axial · 0.40mm/px · z∈[-198,-73]mm · 9 of 30 slices shown, 12 images]
[im 3/30  brain]
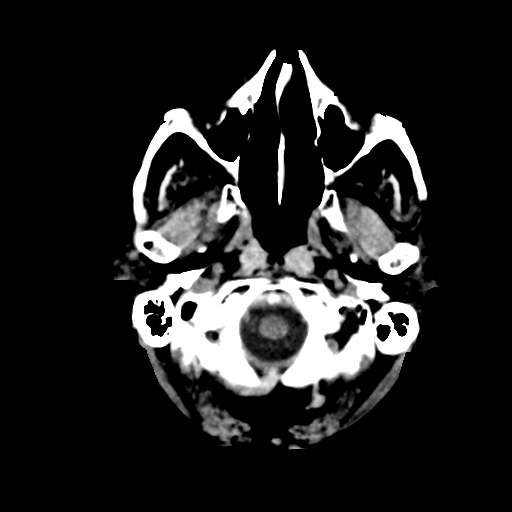
[im 3/30  bone]
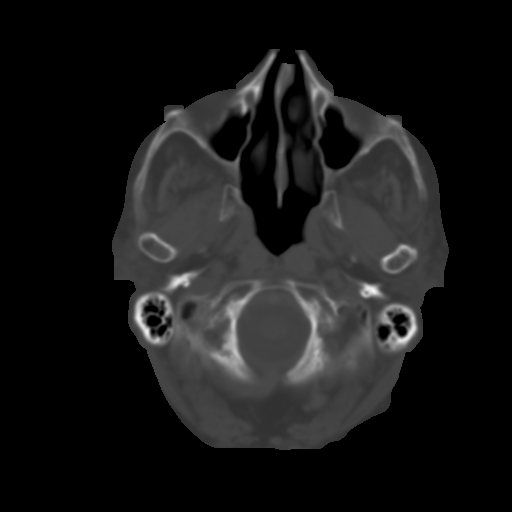
[im 6/30  brain]
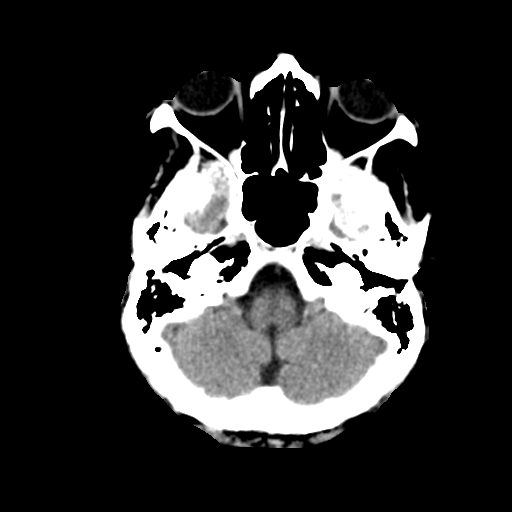
[im 9/30  brain]
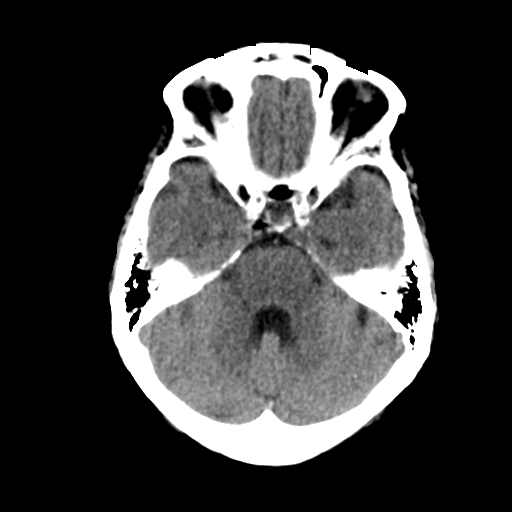
[im 12/30  brain]
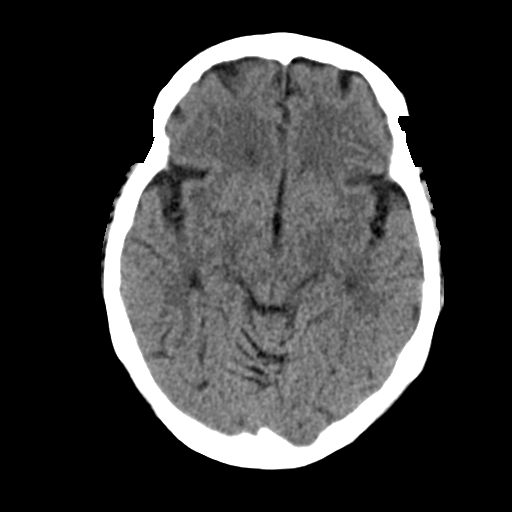
[im 16/30  brain]
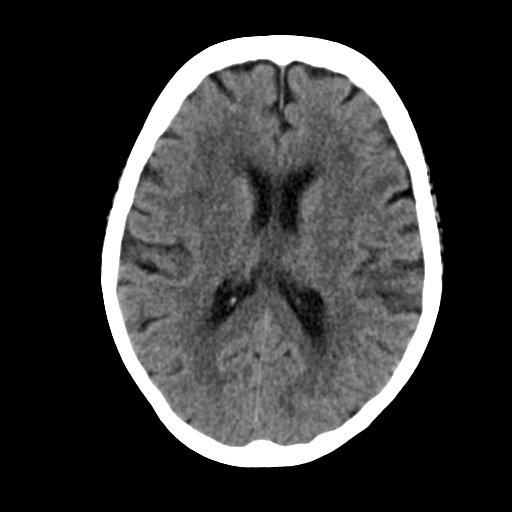
[im 16/30  bone]
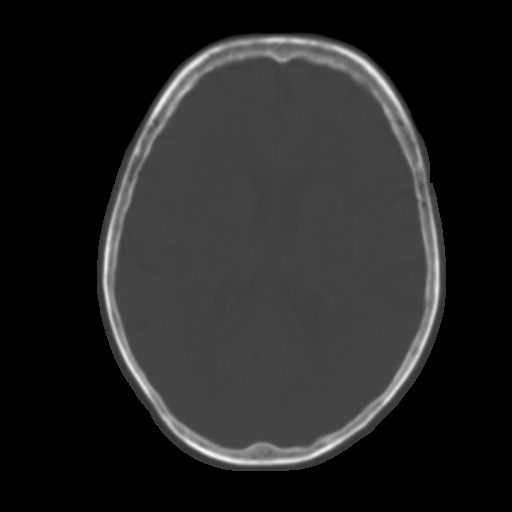
[im 19/30  brain]
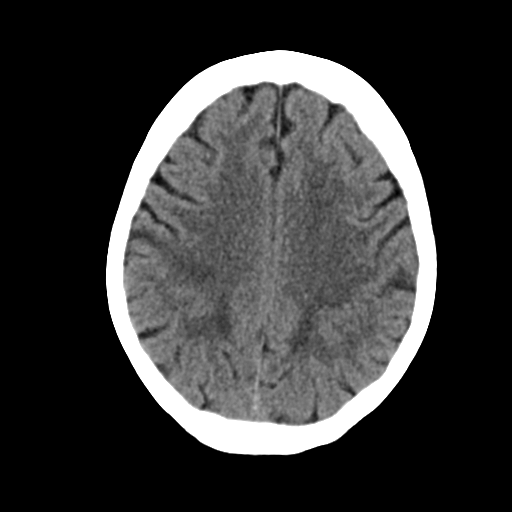
[im 22/30  brain]
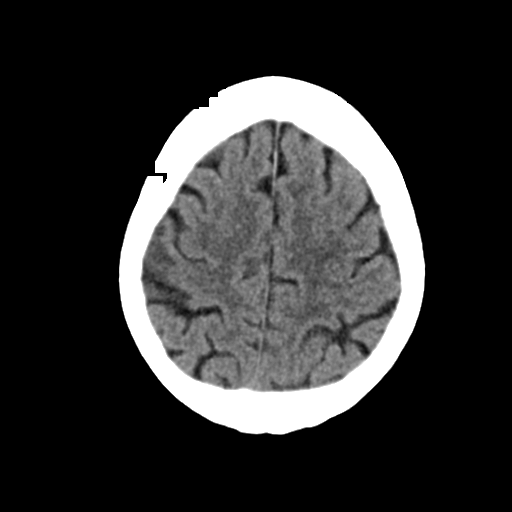
[im 25/30  brain]
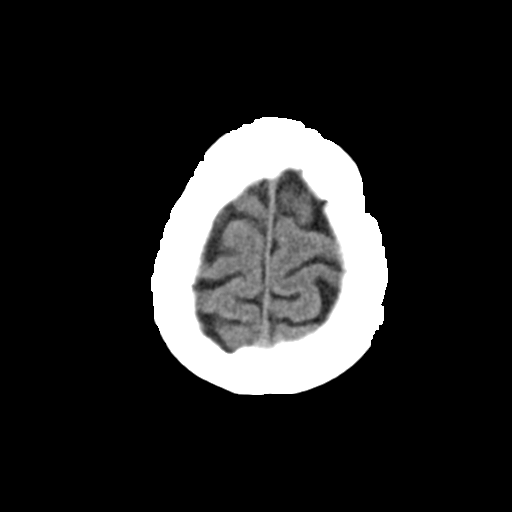
[im 28/30  brain]
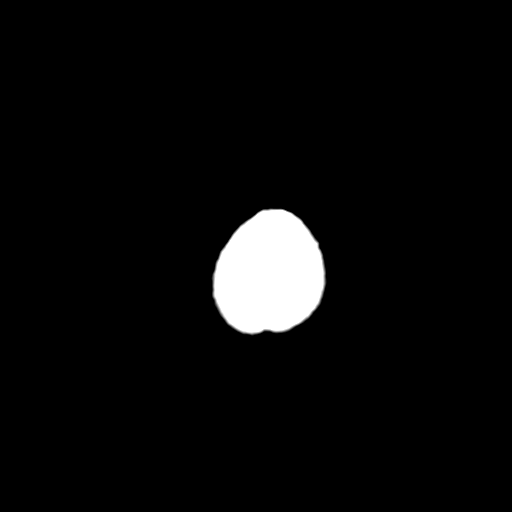
[im 28/30  bone]
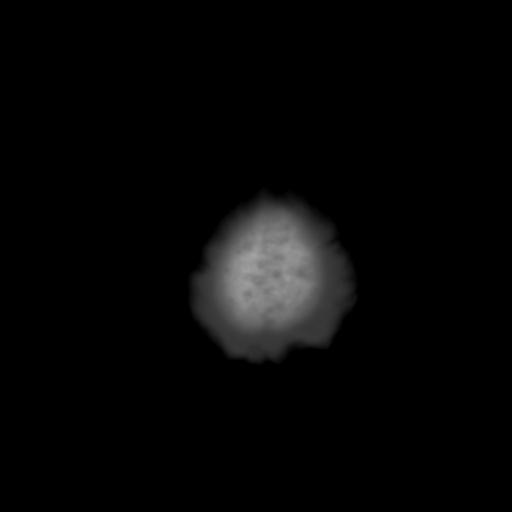

[Series 4: head 3.0 mpr cor · coronal · 0.29mm/px · 3 of 62 slices shown]
[im 21/62  brain]
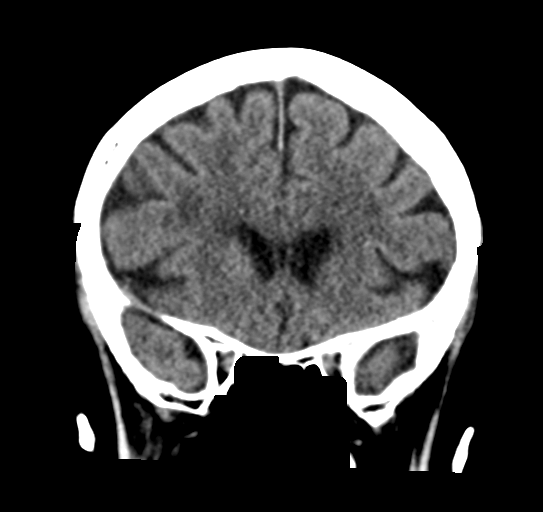
[im 28/62  brain]
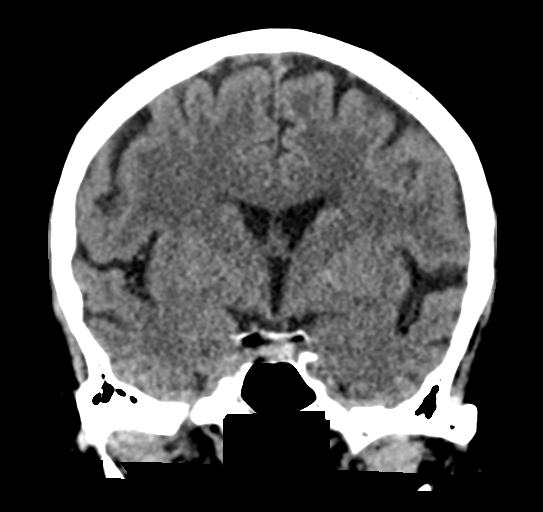
[im 34/62  brain]
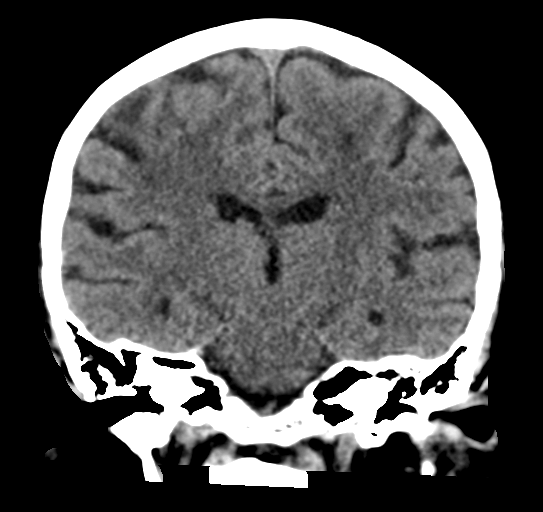

[Series 5: head 3.0 mpr sag · sagittal · 0.30mm/px · 3 of 55 slices shown]
[im 19/55  brain]
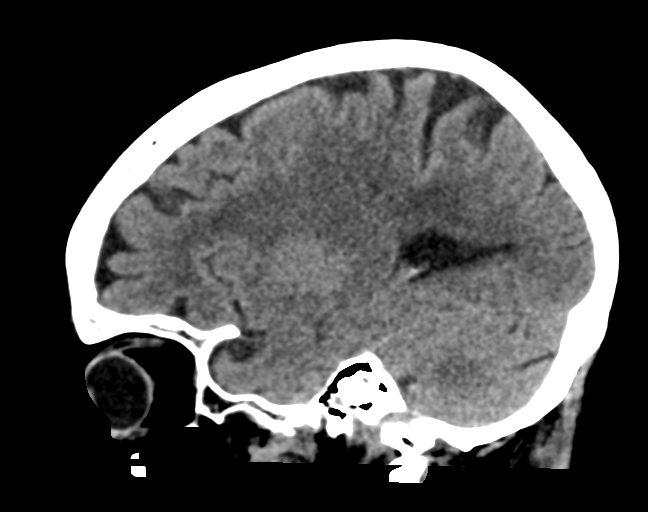
[im 28/55  brain]
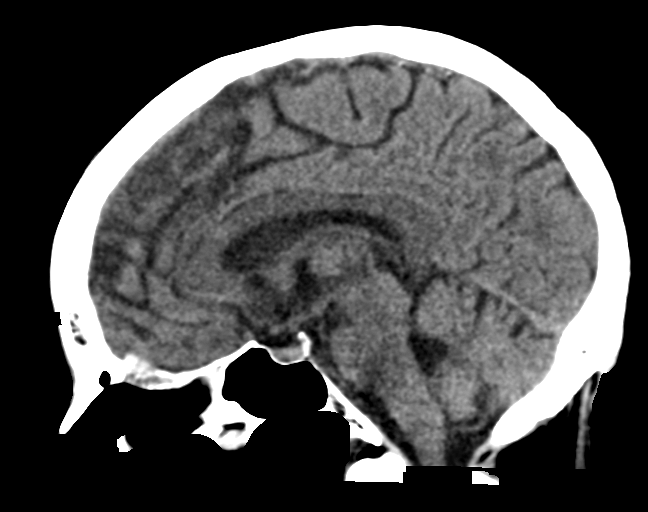
[im 37/55  brain]
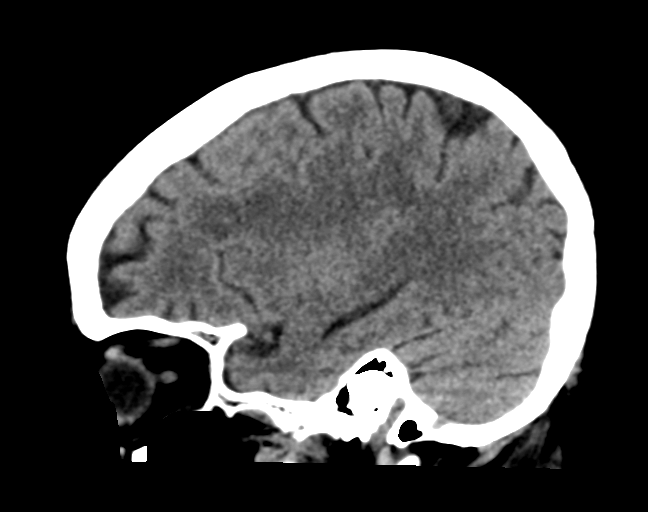

[15 of 47 positions shown; findings below may reference images not displayed]

FINDINGS: Brain: Ventricular system normal in size and appearance for age.
Moderate changes of small vessel disease of the white matter
diffusely. Geographic low attenuation in the right superior parietal
lobe corresponding to the acute stroke identified on the MRI 1 month
ago. Lacunar stroke in the right thalamus, corresponding to 1 of the
acute strokes identified on that examination. No residua elsewhere
from the punctate strokes in both cerebral hemispheres and the
cerebellum identified on that examination.

No mass lesion. No midline shift. No acute hemorrhage or hematoma.
No extra-axial fluid collections. No evidence of acute infarction.

Vascular: Severe bilateral carotid artery siphon atherosclerosis for
age.

Skull: No skull fracture or other significant focal osseous
abnormality involving the skull. Small bone island in the right
frontal bone above the right orbit as noted previously.

Sinuses/Orbits: Visualized paranasal sinuses, bilateral mastoid air
cells and bilateral middle ear cavities well-aerated. Visualized
orbits normal.

Other: None.
IMPRESSION: 1. No acute intracranial abnormality.
2. Low attenuation in the right posterior parietal cortex and in the
right thalamus at the sites of the prior strokes identified on the
MRI 1 month ago. The multiple punctate strokes in both cerebral
hemispheres and both cerebral hemispheres on the prior MRI are not
visible on today's CT.

## 2017-01-12 IMAGING — DX DG CHEST 2V
2 series · 2 of 2 positions shown · non-contrast
Comparison: 11/23/2016, 10/27/2016 and earlier, including CT chest
05/04/2016.

CLINICAL DATA: 60-year-old with fever. Personal history of stroke
in October 2016. Current history of diabetes and asthma. Former
smoker.

EXAM:
CHEST  2 VIEW

[chest lat]
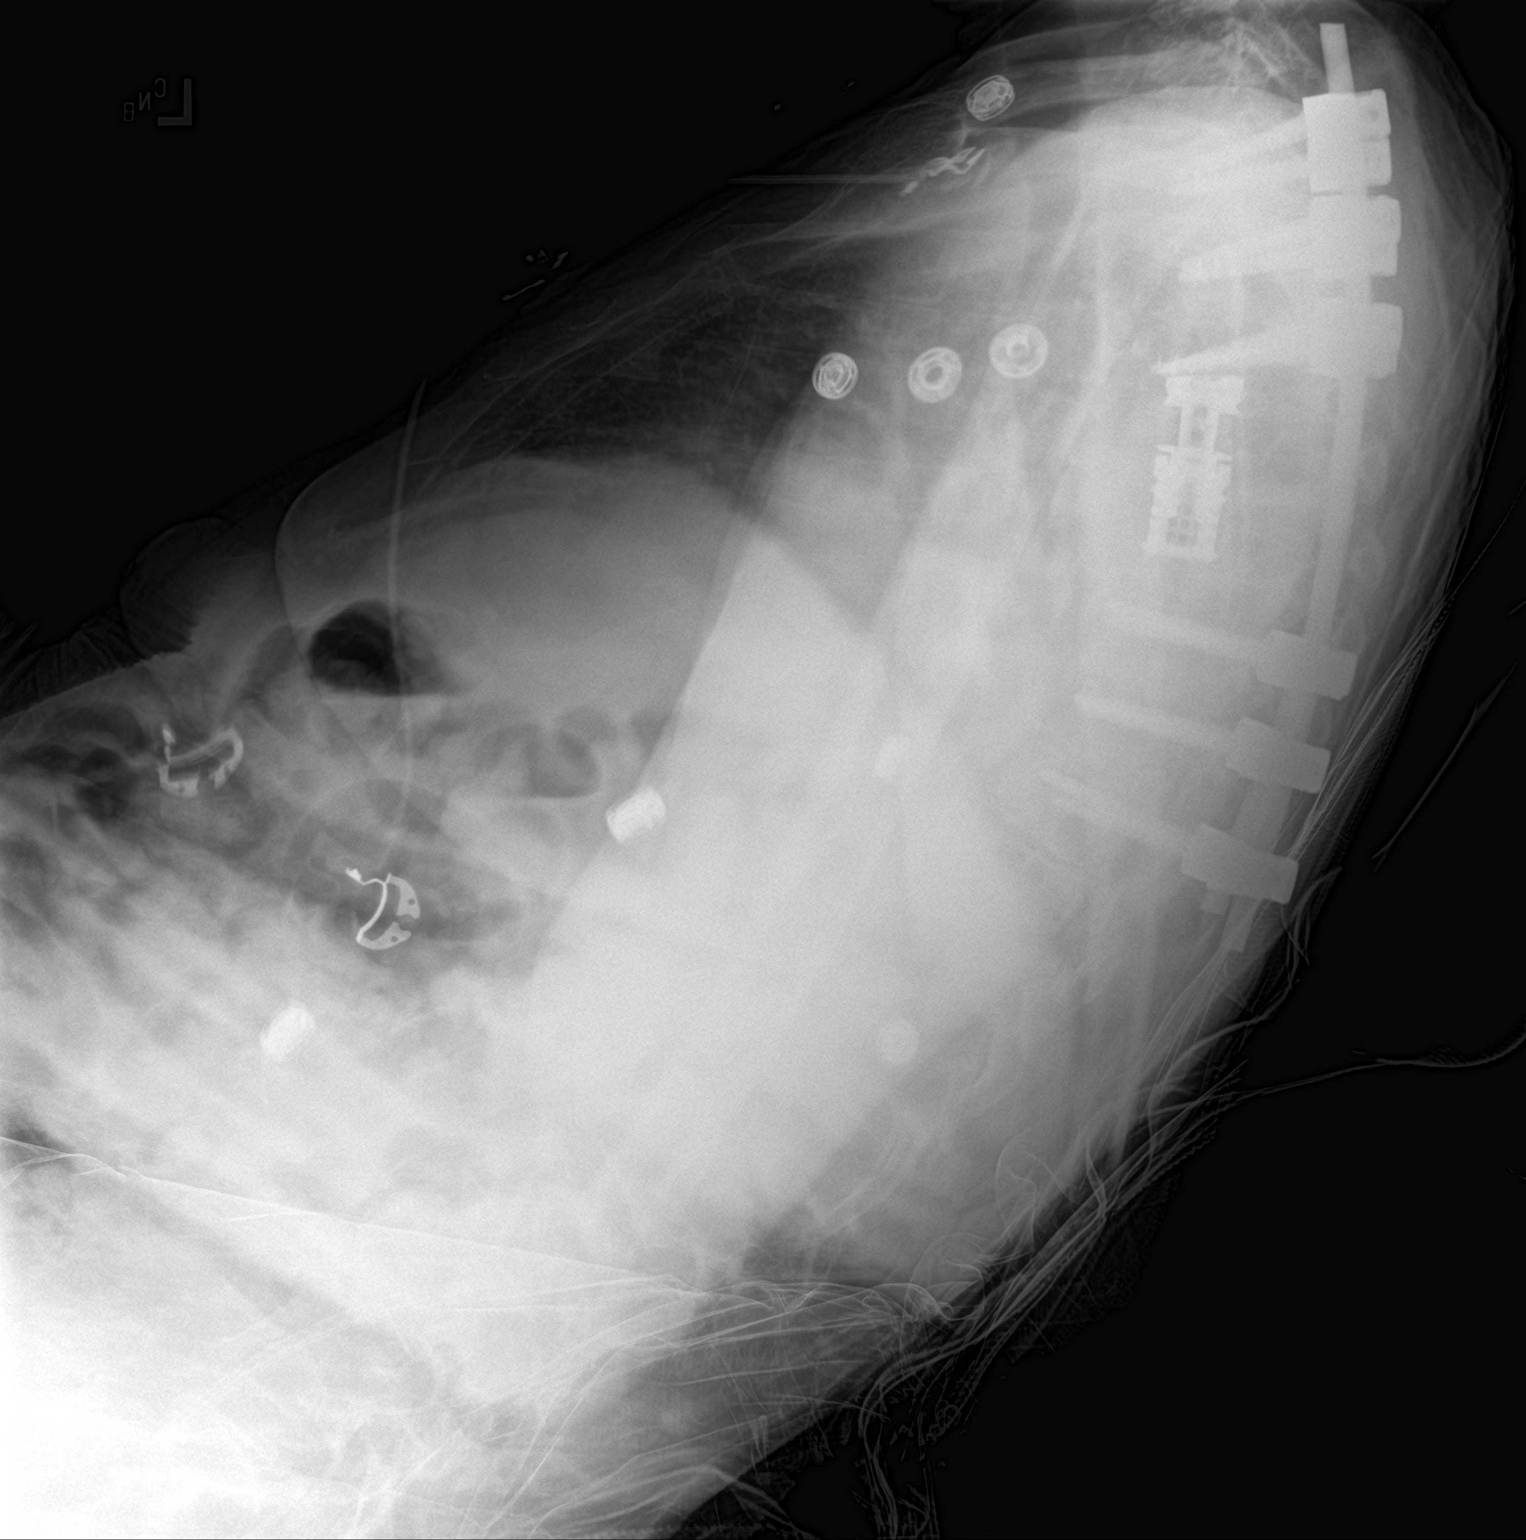

[chest ap]
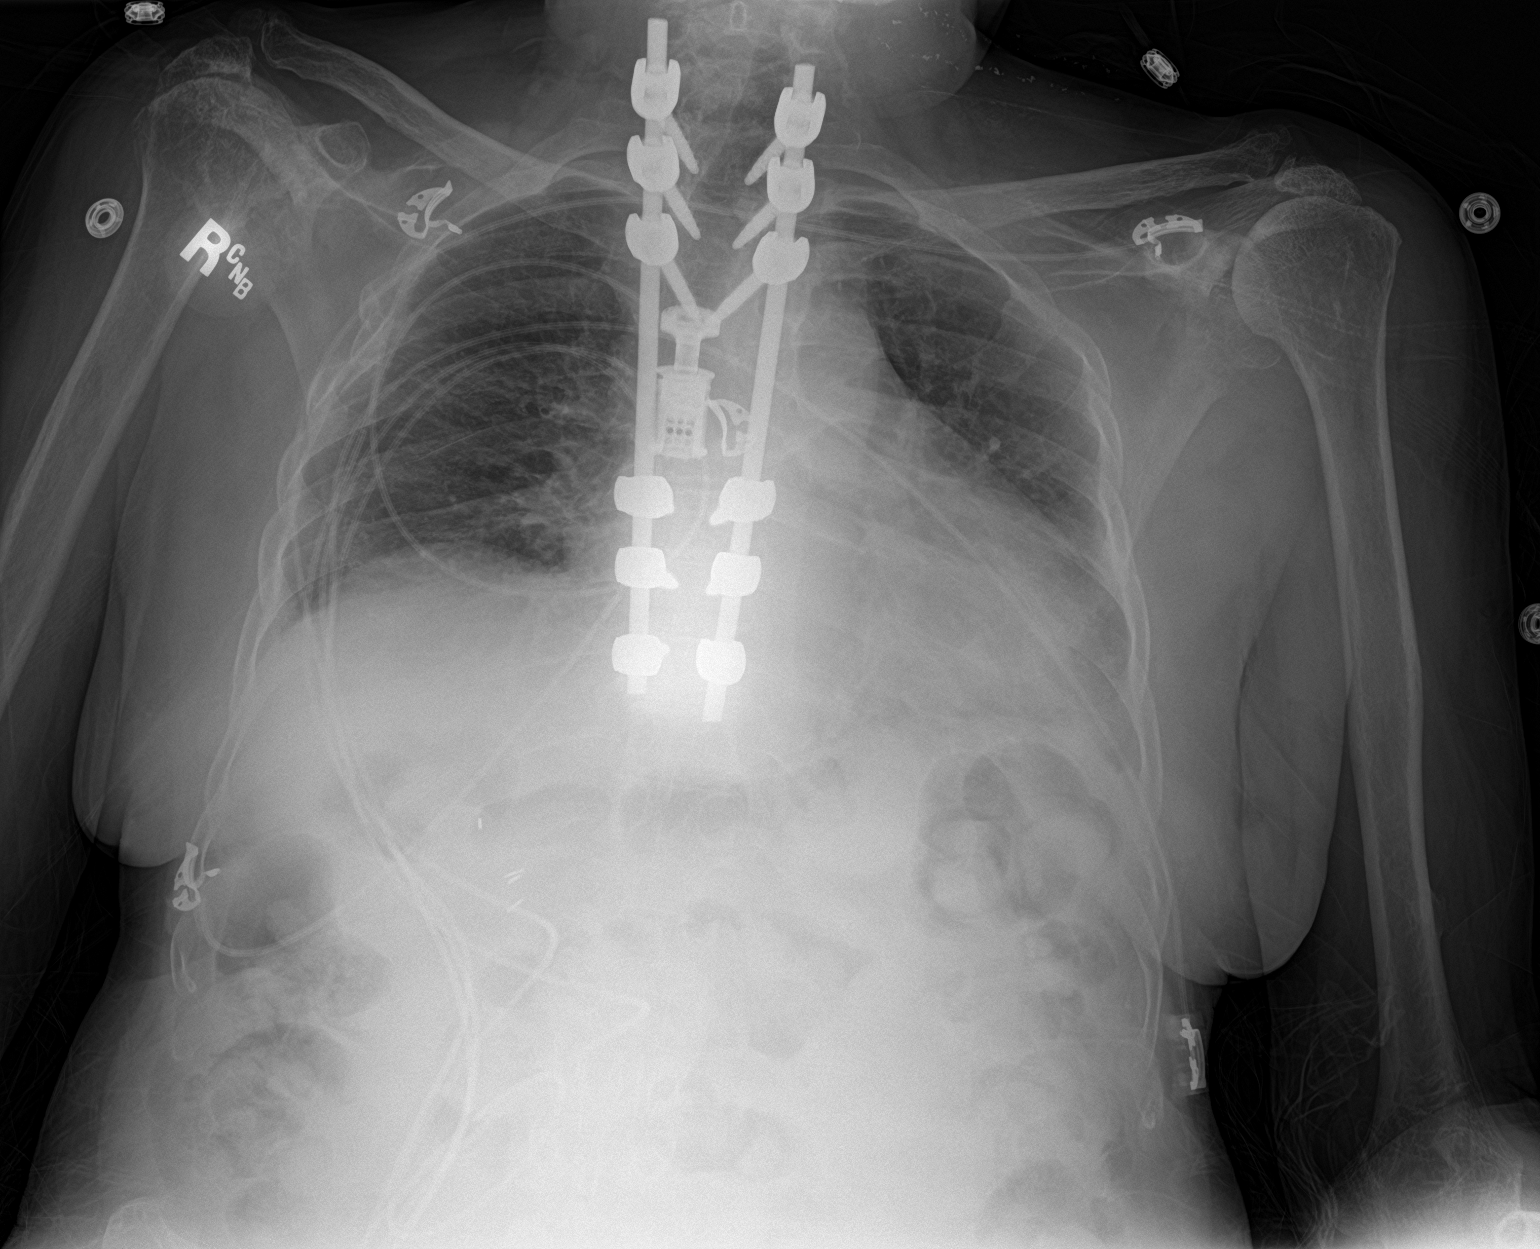

[2 of 2 positions shown; findings below may reference images not displayed]

FINDINGS: AP semi-erect and lateral images were obtained. Lateral image is
suboptimal as the patient was unable to raise her arms. Cardiac
silhouette mildly enlarged, unchanged. Hilar and mediastinal
contours otherwise unremarkable. Elevation of the right
hemidiaphragm likely related to atelectasis at the right lower lobe.
Lungs otherwise clear. Pulmonary vascularity normal. No visible
pleural effusions.

Prior Harrington rod fusion of the thoracic spine and T5 and T6
corpectomy.
IMPRESSION: 1. Atelectasis involving the right lower lobe. No acute
cardiopulmonary disease otherwise.
2. Stable mild cardiomegaly without pulmonary edema.

## 2017-01-13 IMAGING — NM NM PULMONARY VENT & PERF
12 series · 12 of 12 positions shown · non-contrast
Comparison: Chest x-ray 11/26/2016.  VQ scan 10/27/2016

CLINICAL DATA: Elevated D-dimer, shortness of breath, chest pain

EXAM:
NUCLEAR MEDICINE VENTILATION - PERFUSION LUNG SCAN
TECHNIQUE: Ventilation images were obtained in multiple projections using
inhaled aerosol Pc-11m DTPA. Perfusion images were obtained in
multiple projections after intravenous injection of Pc-11m MAA.
RADIOPHARMACEUTICALS:  31.3 mCi Fechnetium-99m DTPA aerosol
inhalation and 4.2 mCi Fechnetium-99m MAA IV

[Series 1: ant/post vent · 4.14mm/px · 1 of 1 slices shown (1 of 2)]
[im 1/1]
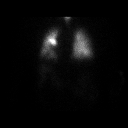

[Series 1: ant/post vent · 4.14mm/px · 1 of 1 slices shown (2 of 2)]
[im 1/1]
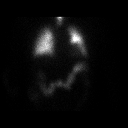

[Series 2: lao/rpo vent · 4.14mm/px · 1 of 1 slices shown (1 of 2)]
[im 1/1]
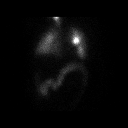

[Series 2: lao/rpo vent · 4.14mm/px · 1 of 1 slices shown (2 of 2)]
[im 1/1]
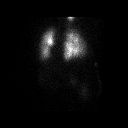

[Series 3: lpo/rao vent · 4.14mm/px · 1 of 1 slices shown (1 of 2)]
[im 1/1]
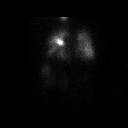

[Series 3: lpo/rao vent · 4.14mm/px · 1 of 1 slices shown (2 of 2)]
[im 1/1]
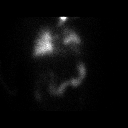

[Series 4: lpo/rao perf · 4.14mm/px · 1 of 1 slices shown (1 of 2)]
[im 1/1]
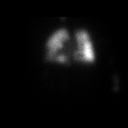

[Series 4: lpo/rao perf · 4.14mm/px · 1 of 1 slices shown (2 of 2)]
[im 1/1]
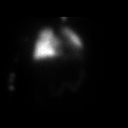

[Series 5: ant/post perf · 4.14mm/px · 1 of 1 slices shown (1 of 2)]
[im 1/1]
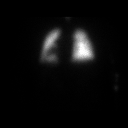

[Series 5: ant/post perf · 4.14mm/px · 1 of 1 slices shown (2 of 2)]
[im 1/1]
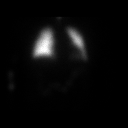

[Series 6: lao/rpo perf · 4.14mm/px · 1 of 1 slices shown (1 of 2)]
[im 1/1]
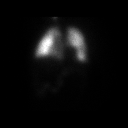

[Series 6: lao/rpo perf · 4.14mm/px · 1 of 1 slices shown (2 of 2)]
[im 1/1]
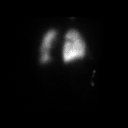

[12 of 12 positions shown; findings below may reference images not displayed]

FINDINGS: Ventilation: No focal ventilation defect.

Perfusion: No wedge shaped peripheral perfusion defects to suggest
acute pulmonary embolism.
IMPRESSION: No evidence of pulmonary embolus.

## 2017-02-03 IMAGING — DX DG CHEST 2V
2 series · 2 of 2 positions shown · non-contrast
Comparison: 11/26/2016

CLINICAL DATA: Chest pain since yesterday

EXAM:
CHEST  2 VIEW

[x chest ap]
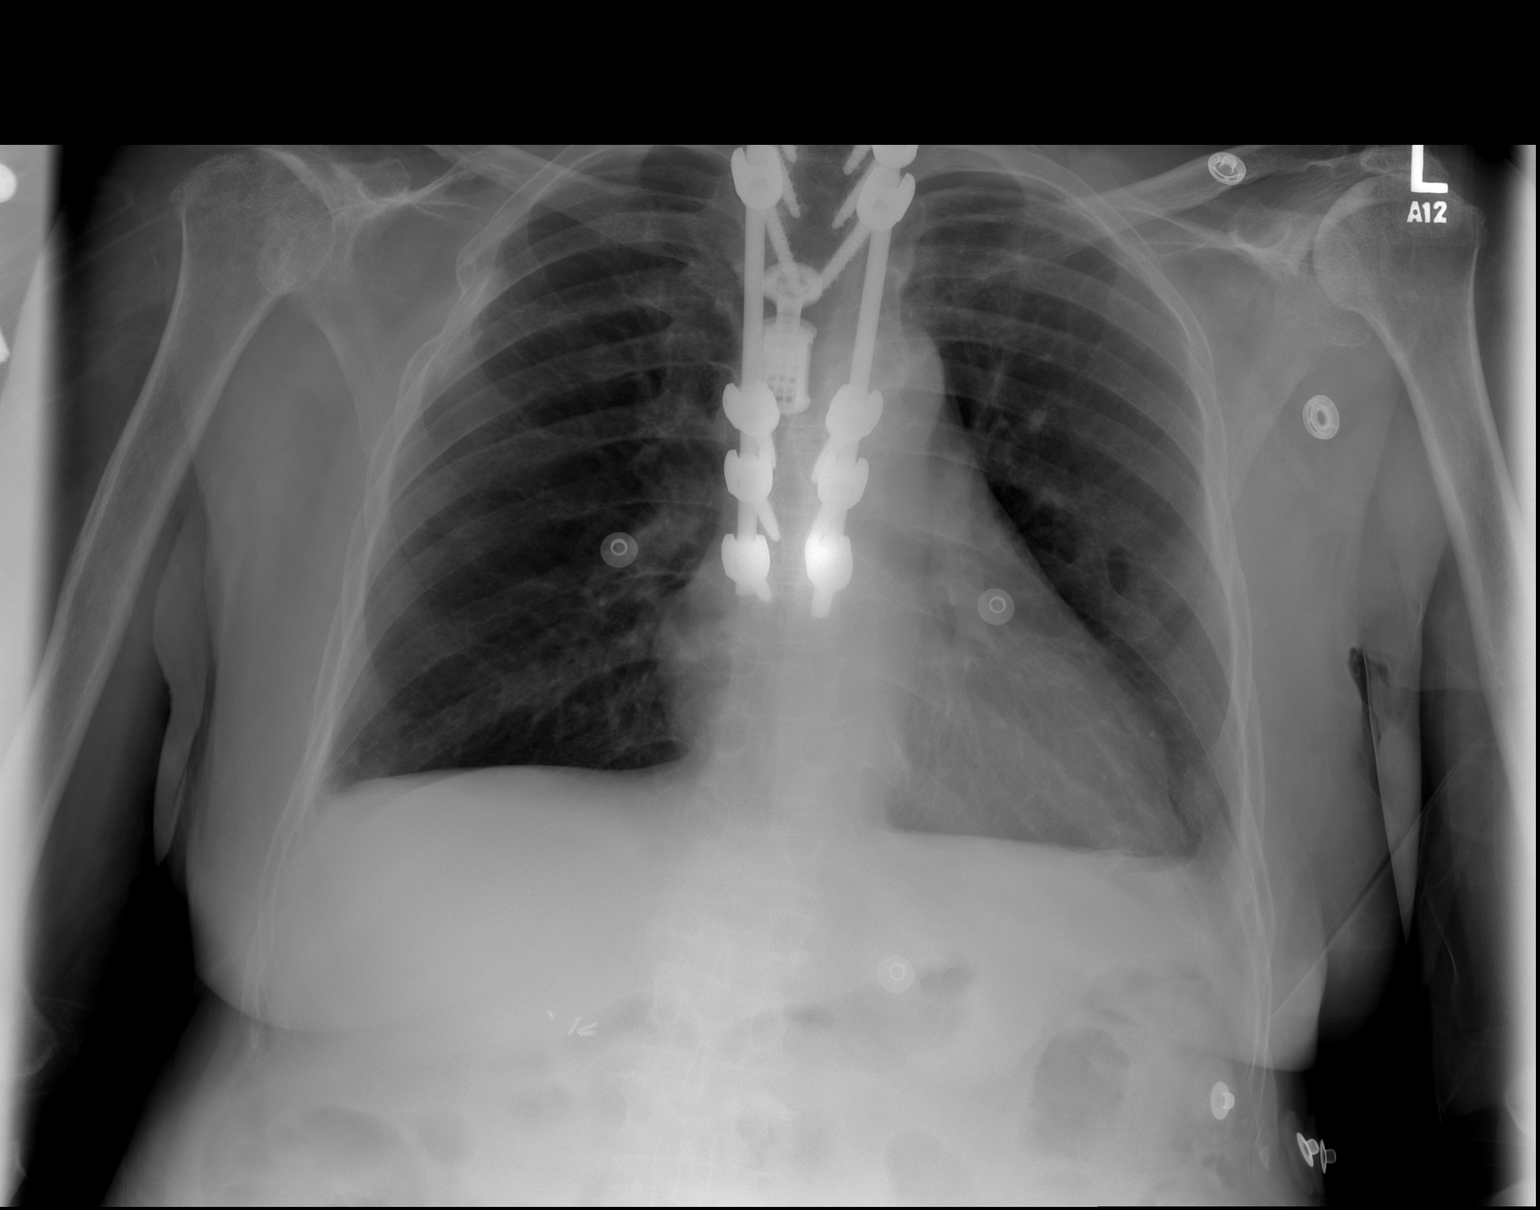

[w chest lat]
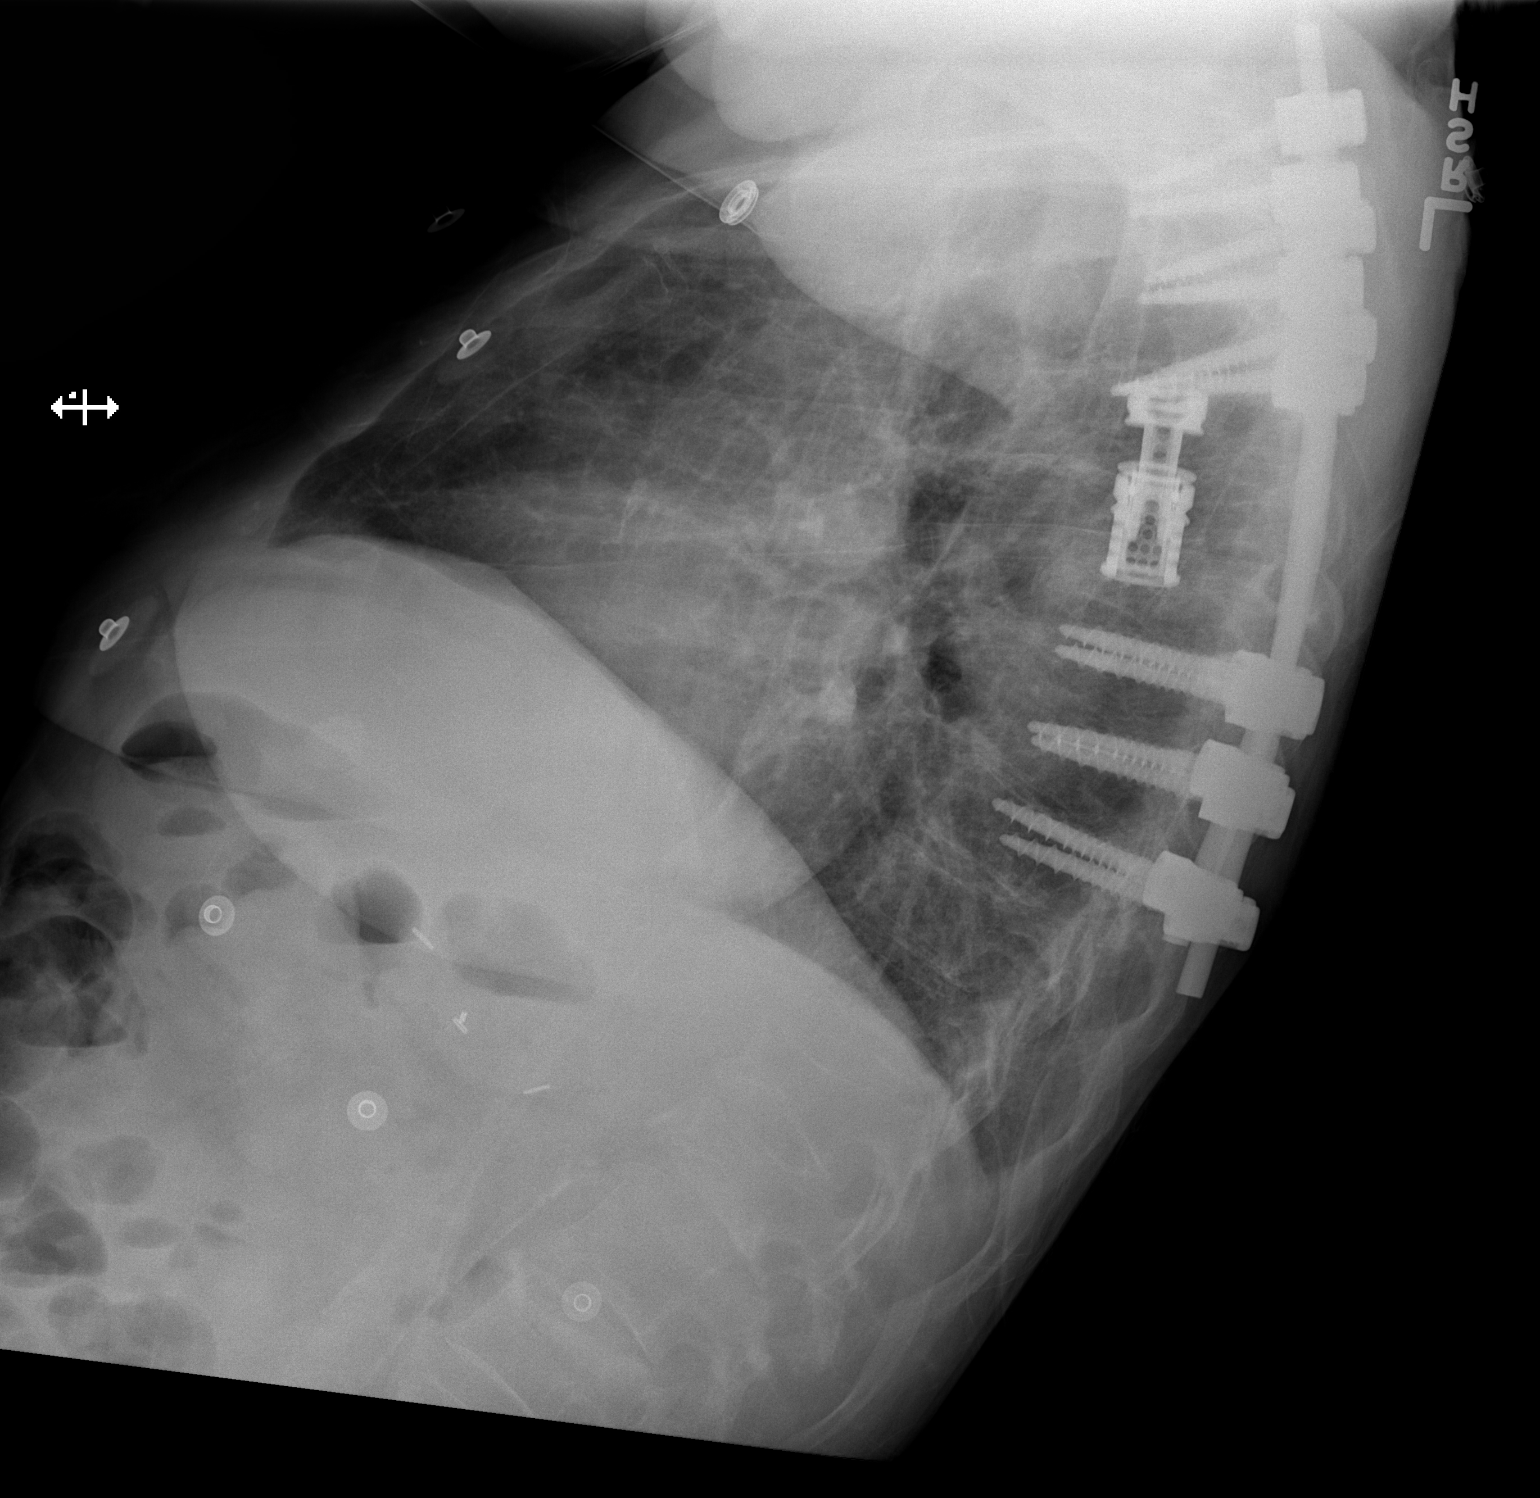

[2 of 2 positions shown; findings below may reference images not displayed]

FINDINGS: Borderline cardiomegaly, stable.  Normal mediastinal contours.

Chronic blunting of the lateral left costophrenic sulcus, scarring
based on 9687 chest CT. There is no edema, consolidation, effusion,
or pneumothorax.

Status post thoracic corpectomy and posterior fusion. T12, L1, and
L2 compression fractures with stable height loss compared to prior.
The fractures are chronic based on 07/01/2016 abdominal CT. Remote
right rib fractures.
IMPRESSION: No acute finding.  Stable from prior.

## 2017-02-15 ENCOUNTER — Encounter: Payer: Self-pay | Admitting: Cardiovascular Disease

## 2017-02-15 DIAGNOSIS — T148XXA Other injury of unspecified body region, initial encounter: Secondary | ICD-10-CM | POA: Insufficient documentation

## 2017-02-15 DIAGNOSIS — Q2112 Patent foramen ovale: Secondary | ICD-10-CM | POA: Insufficient documentation

## 2017-02-15 DIAGNOSIS — Q211 Atrial septal defect: Secondary | ICD-10-CM | POA: Insufficient documentation

## 2017-02-15 DIAGNOSIS — I6930 Unspecified sequelae of cerebral infarction: Secondary | ICD-10-CM | POA: Insufficient documentation

## 2017-02-15 DIAGNOSIS — T50905A Adverse effect of unspecified drugs, medicaments and biological substances, initial encounter: Secondary | ICD-10-CM

## 2017-02-15 DIAGNOSIS — R739 Hyperglycemia, unspecified: Secondary | ICD-10-CM | POA: Insufficient documentation

## 2017-02-15 HISTORY — DX: Other injury of unspecified body region, initial encounter: T14.8XXA

## 2017-03-01 ENCOUNTER — Ambulatory Visit: Payer: Medicaid Other | Admitting: Cardiovascular Disease

## 2017-03-07 ENCOUNTER — Encounter: Payer: Self-pay | Admitting: Cardiovascular Disease

## 2017-04-15 ENCOUNTER — Emergency Department (HOSPITAL_COMMUNITY)
Admission: EM | Admit: 2017-04-15 | Discharge: 2017-04-16 | Disposition: A | Discharge status: Home / Self Care | Payer: Medicaid Other | Attending: Emergency Medicine | Admitting: Emergency Medicine

## 2017-04-15 ENCOUNTER — Encounter (HOSPITAL_COMMUNITY): Payer: Self-pay | Admitting: Emergency Medicine

## 2017-04-15 DIAGNOSIS — J45909 Unspecified asthma, uncomplicated: Secondary | ICD-10-CM | POA: Diagnosis not present

## 2017-04-15 DIAGNOSIS — Z79899 Other long term (current) drug therapy: Secondary | ICD-10-CM | POA: Diagnosis not present

## 2017-04-15 DIAGNOSIS — M79661 Pain in right lower leg: Secondary | ICD-10-CM | POA: Insufficient documentation

## 2017-04-15 DIAGNOSIS — E119 Type 2 diabetes mellitus without complications: Secondary | ICD-10-CM | POA: Diagnosis not present

## 2017-04-15 DIAGNOSIS — M546 Pain in thoracic spine: Secondary | ICD-10-CM | POA: Diagnosis present

## 2017-04-15 DIAGNOSIS — M545 Low back pain: Secondary | ICD-10-CM | POA: Diagnosis not present

## 2017-04-15 DIAGNOSIS — Z7982 Long term (current) use of aspirin: Secondary | ICD-10-CM | POA: Diagnosis not present

## 2017-04-15 DIAGNOSIS — I10 Essential (primary) hypertension: Secondary | ICD-10-CM | POA: Insufficient documentation

## 2017-04-15 DIAGNOSIS — Z87891 Personal history of nicotine dependence: Secondary | ICD-10-CM | POA: Diagnosis not present

## 2017-04-15 DIAGNOSIS — M25561 Pain in right knee: Secondary | ICD-10-CM | POA: Insufficient documentation

## 2017-04-15 DIAGNOSIS — M79662 Pain in left lower leg: Secondary | ICD-10-CM | POA: Diagnosis not present

## 2017-04-15 DIAGNOSIS — G894 Chronic pain syndrome: Secondary | ICD-10-CM | POA: Diagnosis not present

## 2017-04-15 DIAGNOSIS — G8929 Other chronic pain: Secondary | ICD-10-CM

## 2017-04-15 DIAGNOSIS — Z8673 Personal history of transient ischemic attack (TIA), and cerebral infarction without residual deficits: Secondary | ICD-10-CM | POA: Diagnosis not present

## 2017-04-15 DIAGNOSIS — R197 Diarrhea, unspecified: Secondary | ICD-10-CM | POA: Insufficient documentation

## 2017-04-15 MED ORDER — DIAZEPAM 5 MG/ML IJ SOLN
5.0000 mg | Freq: Once | INTRAMUSCULAR | Status: AC
  Administered 2017-04-15: 5 mg via INTRAMUSCULAR
  Filled 2017-04-15: qty 2

## 2017-04-15 MED ORDER — OXYCODONE HCL 5 MG PO TABS
20.0000 mg | ORAL_TABLET | Freq: Once | ORAL | Status: AC
  Administered 2017-04-15: 20 mg via ORAL
  Filled 2017-04-15: qty 4

## 2017-04-15 MED ORDER — DIAZEPAM 5 MG/ML IJ SOLN: 5.0000 mg | Freq: Once | INTRAMUSCULAR | Status: DC

## 2017-04-15 MED ORDER — KETOROLAC TROMETHAMINE 60 MG/2ML IM SOLN
60.0000 mg | Freq: Once | INTRAMUSCULAR | Status: AC
  Administered 2017-04-15: 60 mg via INTRAMUSCULAR
  Filled 2017-04-15: qty 2

## 2017-04-15 NOTE — ED Triage Notes (Signed)
Per EMS pt from home with chronic pain to back, head, right knee, right shoulder, bilateral feet, right ribs.  Pt has history of chronic pain and previous back surgeries. Pt was seen and discharged from [facility] ER this morning. EMS states multiple sets of discharge paperwork from several visits to [facility] in the last few weeks.  Pt states pain is 9/10.  Takes oxycodone for pain at home. VSS.  135*96, 102, 16, 98%, CBG 209

## 2017-04-16 MED ORDER — METHOCARBAMOL 500 MG PO TABS: 500.0000 mg | ORAL_TABLET | Freq: Four times a day (QID) | ORAL | 0 refills | Status: DC | PRN

## 2017-04-16 MED ORDER — LOPERAMIDE HCL 2 MG PO CAPS: 2.0000 mg | ORAL_CAPSULE | Freq: Four times a day (QID) | ORAL | 0 refills | Status: DC | PRN

## 2017-04-16 MED ORDER — LOPERAMIDE HCL 2 MG PO CAPS
4.0000 mg | ORAL_CAPSULE | Freq: Once | ORAL | Status: AC
  Administered 2017-04-16: 4 mg via ORAL
  Filled 2017-04-16: qty 2

## 2017-04-16 MED ORDER — DICYCLOMINE HCL 10 MG PO CAPS
20.0000 mg | ORAL_CAPSULE | Freq: Once | ORAL | Status: AC
  Administered 2017-04-16: 20 mg via ORAL
  Filled 2017-04-16: qty 2

## 2017-04-16 MED ORDER — ACETAMINOPHEN 325 MG PO TABS
325.0000 mg | ORAL_TABLET | Freq: Once | ORAL | Status: AC
  Administered 2017-04-16: 325 mg via ORAL
  Filled 2017-04-16: qty 1

## 2017-04-16 MED ORDER — CYCLOBENZAPRINE HCL 10 MG PO TABS
10.0000 mg | ORAL_TABLET | Freq: Once | ORAL | Status: AC
  Administered 2017-04-16: 10 mg via ORAL
  Filled 2017-04-16: qty 1

## 2017-04-16 MED ORDER — GABAPENTIN 300 MG PO CAPS: 600.0000 mg | ORAL_CAPSULE | Freq: Three times a day (TID) | ORAL | 0 refills | Status: DC

## 2017-04-16 MED ORDER — TRAMADOL HCL 50 MG PO TABS
50.0000 mg | ORAL_TABLET | Freq: Once | ORAL | Status: AC
  Administered 2017-04-16: 50 mg via ORAL
  Filled 2017-04-16: qty 1

## 2017-04-16 NOTE — ED Notes (Signed)
Pt given [place] sandwich and coke per request

## 2017-04-16 NOTE — ED Notes (Signed)
EDP in to re evaluate pt. Delay in discharge.

## 2017-04-16 NOTE — ED Provider Notes (Signed)
[facility]-EMERGENCY DEPT Provider Note   CSN: [id number] Arrival date & time: 04/15/17  2146     History   Chief Complaint Chief Complaint  Patient presents with  . Back Pain    HPI Amber [name] is a 61 y.o. female.  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  Patient was seen earlier today at the [facility] emergency department. She had a laboratory evaluation which showed mild elevation in her liver enzymes, elevated slightly more than they had been prior. It showed no leukocytosis, hemoglobin of 10, which was increased from prior values. She had mild hypokalemia and was given potassium replacement and prescription. Creatinine was 0.65, within normal limits. She had a urinalysis which showed no sign of infection.  Patient was evaluated, and discharged with recommendation to follow-up with her primary care physician. She now presents to our emergency department with concern of continuing pain. Patient reports pain in several locations, including her thoracic and lumbar spine, bilateral lower legs, and right knee. Also notes right shoulder pain. Describes the pain in her bilateral lower extremities as burning. Reports she had been diagnosed with neuropathy. Denies any recent trauma. Reports she's felt hot, however has not had no no recorded fevers. Denies loss of control of her bowel or bladder, new numbness or weakness. Reports she's had diarrhea for the last 4 days. Reports having 3-4 episodes a day. Denies vomiting. Reports associated cramping abdominal pain. Reports that she has been out of her oxycodone for the last 3-4 days.  Past Medical History:  Diagnosis Date  . Arthritis   . Asthma   . Chronic pain   . Constipation   . Diabetes mellitus without complication (HCC)   .  Embolic stroke (HCC)   . Falls frequently   . GERD (gastroesophageal reflux disease)   . Hypertension   . Pneumonia 10/2015  . Seizures [facility])    last seizure March 2015    Patient Active Problem List   Diagnosis Date Noted  . PFO (patent foramen ovale) 02/15/2017  . Impacted fracture 02/15/2017  . History of CVA with residual deficit 02/15/2017  . Drug-induced hyperglycemia 02/15/2017  . Altered mental status   . Chest pain 11/23/2016  . CVA (cerebral vascular accident) (HCC) 10/28/2016  . Tachycardia   . Seizure (HCC) 10/26/2016  . Embolic stroke (HCC) 10/26/2016  . New onset atrial fibrillation (HCC) 10/26/2016  . 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 Focal seizure (HCC)   . Shoulder pain, right   . Duodenitis   . Abdominal pain 11/07/2015  . Constipation 11/07/2015  . Abdominal distension   . Chest pain, musculoskeletal 08/24/2015  . Diabetes mellitus type 2, controlled (HCC) 07/27/2015  . Chronic back pain   . Frequent falls   . Essential hypertension   . Pulmonary hypertension (HCC)   . Gout   . Asthma 06/12/2015  . ACS (acute coronary syndrome) (HCC) 06/12/2015  . Microcytic anemia 01/26/2015  . History of seizures 01/26/2015    Past Surgical History:  Procedure Laterality Date  . APPENDECTOMY    . BACK SURGERY    . CHOLECYSTECTOMY    . TEE WITHOUT CARDIOVERSION N/A 10/30/2016   Procedure: TRANSESOPHAGEAL ECHOCARDIOGRAM (TEE);  Surgeon: Lars Masson, MD;  Location: [facility] ENDOSCOPY;  Service: Cardiovascular;  Laterality: N/A;  OB History    No data available       Home Medications    Prior to Admission medications   Medication Sig Start Date End Date Taking? Authorizing Provider  albuterol (PROVENTIL HFA;VENTOLIN HFA) 108 (90 BASE) MCG/ACT inhaler Inhale 2 puffs into the lungs every 6 (six) hours as needed for wheezing or shortness of breath.    [provider]  allopurinol (ZYLOPRIM) 300 MG tablet Take 300  mg by mouth daily. 11/11/16   [provider]  amLODipine (NORVASC) 5 MG tablet Take 5 mg by mouth daily.    [provider]  aspirin EC 325 MG EC tablet Take 1 tablet (325 mg total) by mouth daily. 10/31/16   Richarda Overlie, MD  atorvastatin (LIPITOR) 10 MG tablet Take 10 mg by mouth daily. 08/13/15   [provider]  diazepam (VALIUM) 5 MG tablet Take 0.5 tablets (2.5 mg total) by mouth 2 (two) times daily. 10/31/16   Richarda Overlie, MD  feeding supplement, ENSURE ENLIVE, (ENSURE ENLIVE) LIQD Take 237 mLs by mouth 2 (two) times daily between meals. 11/10/15   Catarina Hartshorn, MD  Fluticasone-Salmeterol (ADVAIR) 250-50 MCG/DOSE AEPB Inhale 1 puff into the lungs 2 (two) times daily.    [provider]  folic acid (FOLVITE) 1 MG tablet Take 1 mg by mouth daily.    [provider]  gabapentin (NEURONTIN) 300 MG capsule Take 2 capsules (600 mg total) by mouth 3 (three) times daily. 04/16/17 05/01/17  Alvira Monday, MD  lacosamide 100 MG TABS Take 1 tablet (100 mg total) by mouth 2 (two) times daily. 10/31/16   Richarda Overlie, MD  lactulose (CHRONULAC) 10 GM/15ML solution Take 30 mLs (20 g total) by mouth 2 (two) times daily as needed for severe constipation. 08/22/16   Jacalyn Lefevre, MD  LINZESS 145 MCG CAPS capsule Take 1 capsule (145 mcg total) by mouth daily as needed (constipation). Patient not taking: Reported on 12/18/2016 05/26/16   Penny Pia, MD  loperamide (IMODIUM) 2 MG capsule Take 1 capsule (2 mg total) by mouth 4 (four) times daily as needed for diarrhea or loose stools. 04/16/17   Alvira Monday, MD  metFORMIN (GLUCOPHAGE-XR) 500 MG 24 hr tablet Take 500 mg by mouth daily. 11/11/16   [provider]  methocarbamol (ROBAXIN) 500 MG tablet Take 1 tablet (500 mg total) by mouth every 6 (six) hours as needed for muscle spasms. 04/16/17   Alvira Monday, MD  metoprolol succinate (TOPROL-XL) 50 MG 24 hr tablet Take 1 tablet (50 mg total) by mouth  daily. Patient taking differently: Take 50 mg by mouth 2 (two) times daily.  11/25/16 12/25/16  Richarda Overlie, MD  oxyCODONE (OXY IR/ROXICODONE) 5 MG immediate release tablet Take 20 mg by mouth 4 (four) times daily as needed for severe pain.     [provider]  pantoprazole (PROTONIX) 40 MG tablet Take 1 tablet (40 mg total) by mouth 2 (two) times daily before a meal. 12/25/15   Rai, Ripudeep K, MD  potassium chloride 20 MEQ TBCR Take 40 mEq by mouth daily. Patient taking differently: Take 20 mEq by mouth 2 (two) times daily.  11/25/16 12/25/16  Richarda Overlie, MD  predniSONE (DELTASONE) 10 MG tablet Take 15 mg by mouth daily with breakfast.    [provider]  sucralfate (CARAFATE) 1 g tablet Take 1 tablet (1 g total) by mouth 4 (four) times daily -  with meals and at bedtime. 12/18/16   Gwyneth Sprout, MD  torsemide (DEMADEX) 10 MG tablet Take 10 mg by mouth daily.    [provider]  Vitamin D, Ergocalciferol, (DRISDOL) 50000 UNITS CAPS capsule Take 1 capsule (50,000 Units total) by mouth every 7 (seven) days. Sunday Patient taking differently: Take 50,000 Units by mouth every 7 (seven) days. Monday 01/27/15   Calvert Cantor, MD    Family History Family History  Problem Relation Age of Onset  . Kidney failure Mother   . Hypertension Sister   . Hypertension Brother     Social History Social History  Substance Use Topics  . Smoking status: Former Smoker    Quit date: 2010  . Smokeless tobacco: Never Used  . Alcohol use No     Comment: admits to 2 drinks/week     Allergies   Ivp dye [iodinated diagnostic agents]   Review of Systems Review of Systems  Constitutional: Positive for fatigue. Negative for fever.  HENT: Negative for sore throat.   Eyes: Negative for visual disturbance.  Respiratory: Negative for cough and shortness of breath.   Cardiovascular: Negative for chest pain.  Gastrointestinal: Positive for abdominal pain and diarrhea. Negative for  nausea and vomiting.  Genitourinary: Negative for difficulty urinating and dysuria.  Musculoskeletal: Positive for arthralgias, back pain, gait problem, joint swelling, myalgias and neck pain.  Skin: Negative for rash.  Neurological: Negative for syncope, weakness, numbness and headaches.     Physical Exam Updated Vital Signs BP 105/77   Pulse 97   Temp 98.7 F (37.1 C) (Oral)   Resp 17   SpO2 99%   Physical Exam  Constitutional: She is oriented to person, place, and time. She appears well-developed and well-nourished. No distress.  HENT:  Head: Normocephalic and atraumatic.  Eyes: Conjunctivae and EOM are normal.  Neck: Normal range of motion.  Cardiovascular: Normal rate, regular rhythm, normal heart sounds and intact distal pulses.  Exam reveals no gallop and no friction rub.   No murmur heard. Pulmonary/Chest: Effort normal and breath sounds normal. No respiratory distress. She has no wheezes. She has no rales.  Abdominal: Soft. She exhibits no distension. There is no tenderness. There is no guarding.  Musculoskeletal: She exhibits no edema.       Right shoulder: She exhibits tenderness and bony tenderness. She exhibits no deformity and normal pulse.       Right knee: She exhibits bony tenderness. She exhibits normal range of motion, no swelling, no ecchymosis, no deformity and no erythema. Tenderness found. Medial joint line, lateral joint line and patellar tendon tenderness noted.       Left knee: She exhibits normal range of motion and no effusion.       Right ankle: She exhibits normal range of motion, no swelling and no deformity.       Left ankle: She exhibits normal range of motion, no swelling and no deformity.       Thoracic back: She exhibits tenderness and bony tenderness.       Lumbar back: She exhibits tenderness and bony tenderness.  Neurological: She is alert and oriented to person, place, and time. She has normal strength. No sensory deficit. GCS eye subscore is  4. GCS verbal subscore is 5. GCS motor subscore is 6.  Skin: Skin is warm and dry. No rash noted. She is not diaphoretic. No erythema.  Nursing note and vitals reviewed.    ED Treatments / Results  Labs (all labs ordered are listed, but only abnormal results are displayed) Labs Reviewed -  No data to display  EKG  EKG Interpretation None       Radiology No results found.  Procedures Procedures (including critical care time)  Medications Ordered in ED Medications  acetaminophen (TYLENOL) tablet 325 mg (not administered)  traMADol (ULTRAM) tablet 50 mg (not administered)  ketorolac (TORADOL) injection 60 mg (60 mg Intramuscular Given 04/15/17 2314)  oxyCODONE (Oxy IR/ROXICODONE) immediate release tablet 20 mg (20 mg Oral Given 04/15/17 2314)  diazepam (VALIUM) injection 5 mg (5 mg Intramuscular Given 04/15/17 2313)  dicyclomine (BENTYL) capsule 20 mg (20 mg Oral Given 04/16/17 0030)  cyclobenzaprine (FLEXERIL) tablet 10 mg (10 mg Oral Given 04/16/17 0030)  loperamide (IMODIUM) capsule 4 mg (4 mg Oral Given 04/16/17 0030)     Initial Impression / Assessment and Plan / ED Course  I have reviewed the triage vital signs and the nursing notes.  Pertinent labs & imaging results that were available during my care of the patient were reviewed by me and considered in my medical decision making (see chart for details).    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  Patient seen at [facility] regional today for the same.  Reviewed the lab work that was completed at [facility]. Patient with mild hypokalemia and was given replacement. She had mild transaminitis, and recommended primary care physician follow-up. Do not see indication for admission based on the  lab work that was completed earlier today. Regarding pain, she has no signs of cauda equina on history or exam, history of new trauma to suggest fracture, no fevers or leukocytosis to suggest acute spinal infection. Her abdominal exam is benign, and doubt acute intra-abdominal pathology. Her increasing pain and diarrhea may be secondary to opiate withdrawal in setting of being out of her oxycodone. Discussed that we can treat her pain here, however were unable to prescribe opiates. She was given a dose of home Roxicodone, Toradol, Valium, followed by Flexeril, tramadol, Bentyl and loperamide..  Discussed that we can increase her gabapentin dose from 300 mg 3 times a day to 600 mg 3 times a day, and prescribed Robaxin and loperamide for symptomatic relief. Recommend patient follow up with her primary care physician in pain doctor. Feel her symptoms are likely exacerbation of chronic pain, possible opiate withdrawal, possible rheumatoid arthritis and neuropathy.  Pt stable for outpt follow up. Patient discharged in stable condition with understanding of reasons to return.    Final Clinical Impressions(s) / ED Diagnoses   Final diagnoses:  Chronic pain syndrome  Chronic midline thoracic back pain  Diarrhea, unspecified type    New Prescriptions New Prescriptions   GABAPENTIN (NEURONTIN) 300 MG CAPSULE    Take 2 capsules (600 mg total) by mouth 3 (three) times daily.   LOPERAMIDE (IMODIUM) 2 MG CAPSULE    Take 1 capsule (2 mg total) by mouth 4 (four) times daily as needed for diarrhea or loose stools.   METHOCARBAMOL (ROBAXIN) 500 MG TABLET    Take 1 tablet (500 mg total) by mouth every 6 (six) hours as needed for muscle spasms.     Alvira Monday, MD 04/16/17 [phone]

## 2017-10-20 ENCOUNTER — Other Ambulatory Visit: Payer: Self-pay

## 2017-10-20 ENCOUNTER — Emergency Department (HOSPITAL_COMMUNITY): Payer: Medicaid Other

## 2017-10-20 ENCOUNTER — Inpatient Hospital Stay (HOSPITAL_COMMUNITY)
Admission: EM | Admit: 2017-10-20 | Discharge: 2017-10-23 | DRG: 184 | Disposition: A | Discharge status: Home Health | Payer: Medicaid Other | Attending: Internal Medicine | Admitting: Internal Medicine

## 2017-10-20 DIAGNOSIS — Z87891 Personal history of nicotine dependence: Secondary | ICD-10-CM

## 2017-10-20 DIAGNOSIS — Z7984 Long term (current) use of oral hypoglycemic drugs: Secondary | ICD-10-CM

## 2017-10-20 DIAGNOSIS — E119 Type 2 diabetes mellitus without complications: Secondary | ICD-10-CM | POA: Diagnosis present

## 2017-10-20 DIAGNOSIS — Z7901 Long term (current) use of anticoagulants: Secondary | ICD-10-CM

## 2017-10-20 DIAGNOSIS — R0781 Pleurodynia: Secondary | ICD-10-CM | POA: Diagnosis not present

## 2017-10-20 DIAGNOSIS — Z833 Family history of diabetes mellitus: Secondary | ICD-10-CM

## 2017-10-20 DIAGNOSIS — I10 Essential (primary) hypertension: Secondary | ICD-10-CM | POA: Diagnosis present

## 2017-10-20 DIAGNOSIS — Z8673 Personal history of transient ischemic attack (TIA), and cerebral infarction without residual deficits: Secondary | ICD-10-CM

## 2017-10-20 DIAGNOSIS — K922 Gastrointestinal hemorrhage, unspecified: Secondary | ICD-10-CM | POA: Diagnosis present

## 2017-10-20 DIAGNOSIS — D649 Anemia, unspecified: Secondary | ICD-10-CM

## 2017-10-20 DIAGNOSIS — Z841 Family history of disorders of kidney and ureter: Secondary | ICD-10-CM

## 2017-10-20 DIAGNOSIS — W182XXA Fall in (into) shower or empty bathtub, initial encounter: Secondary | ICD-10-CM | POA: Diagnosis present

## 2017-10-20 DIAGNOSIS — Z7952 Long term (current) use of systemic steroids: Secondary | ICD-10-CM

## 2017-10-20 DIAGNOSIS — D638 Anemia in other chronic diseases classified elsewhere: Secondary | ICD-10-CM | POA: Diagnosis present

## 2017-10-20 DIAGNOSIS — K625 Hemorrhage of anus and rectum: Secondary | ICD-10-CM

## 2017-10-20 DIAGNOSIS — W19XXXA Unspecified fall, initial encounter: Secondary | ICD-10-CM

## 2017-10-20 DIAGNOSIS — M549 Dorsalgia, unspecified: Secondary | ICD-10-CM

## 2017-10-20 DIAGNOSIS — Z8249 Family history of ischemic heart disease and other diseases of the circulatory system: Secondary | ICD-10-CM

## 2017-10-20 DIAGNOSIS — G8929 Other chronic pain: Secondary | ICD-10-CM | POA: Diagnosis present

## 2017-10-20 DIAGNOSIS — T402X5A Adverse effect of other opioids, initial encounter: Secondary | ICD-10-CM | POA: Diagnosis present

## 2017-10-20 DIAGNOSIS — M4856XA Collapsed vertebra, not elsewhere classified, lumbar region, initial encounter for fracture: Secondary | ICD-10-CM | POA: Diagnosis present

## 2017-10-20 DIAGNOSIS — K219 Gastro-esophageal reflux disease without esophagitis: Secondary | ICD-10-CM | POA: Diagnosis present

## 2017-10-20 DIAGNOSIS — I272 Pulmonary hypertension, unspecified: Secondary | ICD-10-CM | POA: Diagnosis present

## 2017-10-20 DIAGNOSIS — M1990 Unspecified osteoarthritis, unspecified site: Secondary | ICD-10-CM | POA: Diagnosis present

## 2017-10-20 DIAGNOSIS — Z7982 Long term (current) use of aspirin: Secondary | ICD-10-CM

## 2017-10-20 DIAGNOSIS — Z7951 Long term (current) use of inhaled steroids: Secondary | ICD-10-CM

## 2017-10-20 DIAGNOSIS — D509 Iron deficiency anemia, unspecified: Secondary | ICD-10-CM | POA: Diagnosis present

## 2017-10-20 DIAGNOSIS — S2241XA Multiple fractures of ribs, right side, initial encounter for closed fracture: Principal | ICD-10-CM | POA: Diagnosis present

## 2017-10-20 DIAGNOSIS — Z91041 Radiographic dye allergy status: Secondary | ICD-10-CM

## 2017-10-20 DIAGNOSIS — R569 Unspecified convulsions: Secondary | ICD-10-CM | POA: Diagnosis present

## 2017-10-20 DIAGNOSIS — Z9049 Acquired absence of other specified parts of digestive tract: Secondary | ICD-10-CM

## 2017-10-20 DIAGNOSIS — R400 Somnolence: Secondary | ICD-10-CM | POA: Diagnosis present

## 2017-10-20 DIAGNOSIS — R296 Repeated falls: Secondary | ICD-10-CM

## 2017-10-20 DIAGNOSIS — J45909 Unspecified asthma, uncomplicated: Secondary | ICD-10-CM | POA: Diagnosis present

## 2017-10-20 DIAGNOSIS — I959 Hypotension, unspecified: Secondary | ICD-10-CM | POA: Diagnosis not present

## 2017-10-20 DIAGNOSIS — E876 Hypokalemia: Secondary | ICD-10-CM | POA: Diagnosis present

## 2017-10-20 LAB — URINALYSIS, ROUTINE W REFLEX MICROSCOPIC
Bacteria, UA: NONE SEEN
Bilirubin Urine: NEGATIVE
Glucose, UA: NEGATIVE mg/dL
HGB URINE DIPSTICK: NEGATIVE
Ketones, ur: NEGATIVE mg/dL
LEUKOCYTES UA: NEGATIVE
Nitrite: NEGATIVE
PH: 6 (ref 5.0–8.0)
Protein, ur: 30 mg/dL — AB
SPECIFIC GRAVITY, URINE: 1.013 (ref 1.005–1.030)

## 2017-10-20 LAB — CBC
HEMATOCRIT: 25.7 % — AB (ref 36.0–46.0)
HEMOGLOBIN: 7.9 g/dL — AB (ref 12.0–15.0)
MCH: 23.3 pg — ABNORMAL LOW (ref 26.0–34.0)
MCHC: 30.7 g/dL (ref 30.0–36.0)
MCV: 75.8 fL — AB (ref 78.0–100.0)
Platelets: 161 10*3/uL (ref 150–400)
RBC: 3.39 MIL/uL — ABNORMAL LOW (ref 3.87–5.11)
RDW: 20.1 % — AB (ref 11.5–15.5)
WBC: 7.5 10*3/uL (ref 4.0–10.5)

## 2017-10-20 LAB — I-STAT TROPONIN, ED: Troponin i, poc: 0 ng/mL (ref 0.00–0.08)

## 2017-10-20 LAB — ABO/RH: ABO/RH(D): O POS

## 2017-10-20 LAB — HEPATIC FUNCTION PANEL
ALK PHOS: 90 U/L (ref 38–126)
ALT: 19 U/L (ref 14–54)
AST: 26 U/L (ref 15–41)
Albumin: 2.7 g/dL — ABNORMAL LOW (ref 3.5–5.0)
Bilirubin, Direct: <0.1 mg/dL — ABNORMAL LOW (ref 0.1–0.5)
TOTAL PROTEIN: 5.8 g/dL — AB (ref 6.5–8.1)
Total Bilirubin: 0.7 mg/dL (ref 0.3–1.2)

## 2017-10-20 LAB — BASIC METABOLIC PANEL
ANION GAP: 5 (ref 5–15)
BUN: 12 mg/dL (ref 6–20)
CHLORIDE: 114 mmol/L — AB (ref 101–111)
CO2: 20 mmol/L — AB (ref 22–32)
Calcium: 8.9 mg/dL (ref 8.9–10.3)
Creatinine, Ser: 1.06 mg/dL — ABNORMAL HIGH (ref 0.44–1.00)
GFR calc Af Amer: >60 mL/min (ref >60)
GFR, EST NON AFRICAN AMERICAN: 56 mL/min — AB (ref >60)
GLUCOSE: 92 mg/dL (ref 65–99)
Potassium: 3.6 mmol/L (ref 3.5–5.1)
Sodium: 139 mmol/L (ref 135–145)

## 2017-10-20 LAB — I-STAT CG4 LACTIC ACID, ED: Lactic Acid, Venous: 0.84 mmol/L (ref 0.5–1.9)

## 2017-10-20 LAB — TYPE AND SCREEN
ABO/RH(D): O POS
ANTIBODY SCREEN: NEGATIVE

## 2017-10-20 LAB — GLUCOSE, CAPILLARY: Glucose-Capillary: 119 mg/dL — ABNORMAL HIGH (ref 65–99)

## 2017-10-20 LAB — POC OCCULT BLOOD, ED: Fecal Occult Bld: NEGATIVE

## 2017-10-20 LAB — LIPASE, BLOOD: LIPASE: 19 U/L (ref 11–51)

## 2017-10-20 MED ORDER — INSULIN ASPART 100 UNIT/ML ~~LOC~~ SOLN: 0.0000 [IU] | Freq: Every day | SUBCUTANEOUS | Status: DC

## 2017-10-20 MED ORDER — LIDOCAINE 5 % EX PTCH
1.0000 | MEDICATED_PATCH | CUTANEOUS | Status: AC
  Administered 2017-10-20 – 2017-10-21 (×2): 1 via TRANSDERMAL
  Filled 2017-10-20 (×2): qty 1

## 2017-10-20 MED ORDER — GABAPENTIN 300 MG PO CAPS
600.0000 mg | ORAL_CAPSULE | Freq: Three times a day (TID) | ORAL | Status: DC
  Administered 2017-10-20 – 2017-10-21 (×4): 600 mg via ORAL
  Filled 2017-10-20 (×4): qty 2

## 2017-10-20 MED ORDER — PREDNISONE 10 MG PO TABS
15.0000 mg | ORAL_TABLET | Freq: Every day | ORAL | Status: DC
  Administered 2017-10-21 – 2017-10-23 (×3): 15 mg via ORAL
  Filled 2017-10-20 (×3): qty 1

## 2017-10-20 MED ORDER — SENNOSIDES-DOCUSATE SODIUM 8.6-50 MG PO TABS
1.0000 | ORAL_TABLET | Freq: Every evening | ORAL | Status: DC | PRN
Start: 2017-10-20 — End: 2017-10-23
  Administered 2017-10-21: 1 via ORAL
  Filled 2017-10-20: qty 1

## 2017-10-20 MED ORDER — PANTOPRAZOLE SODIUM 40 MG IV SOLR
40.0000 mg | INTRAVENOUS | Status: DC
  Administered 2017-10-20: 40 mg via INTRAVENOUS
  Filled 2017-10-20: qty 40

## 2017-10-20 MED ORDER — INSULIN ASPART 100 UNIT/ML ~~LOC~~ SOLN
0.0000 [IU] | Freq: Three times a day (TID) | SUBCUTANEOUS | Status: DC
  Administered 2017-10-21: 2 [IU] via SUBCUTANEOUS
  Administered 2017-10-21 – 2017-10-22 (×2): 3 [IU] via SUBCUTANEOUS
  Administered 2017-10-23: 2 [IU] via SUBCUTANEOUS
  Administered 2017-10-23: 3 [IU] via SUBCUTANEOUS

## 2017-10-20 MED ORDER — OXYCODONE HCL 5 MG PO TABS
20.0000 mg | ORAL_TABLET | Freq: Four times a day (QID) | ORAL | Status: DC | PRN
  Administered 2017-10-20 – 2017-10-21 (×3): 20 mg via ORAL
  Filled 2017-10-20 (×3): qty 4

## 2017-10-20 MED ORDER — ACETAMINOPHEN 650 MG RE SUPP: 650.0000 mg | Freq: Four times a day (QID) | RECTAL | Status: DC | PRN

## 2017-10-20 MED ORDER — AMLODIPINE BESYLATE 5 MG PO TABS
5.0000 mg | ORAL_TABLET | Freq: Every day | ORAL | Status: DC
  Administered 2017-10-21: 5 mg via ORAL
  Filled 2017-10-20: qty 1

## 2017-10-20 MED ORDER — ACETAMINOPHEN 325 MG PO TABS
650.0000 mg | ORAL_TABLET | Freq: Four times a day (QID) | ORAL | Status: DC | PRN
  Administered 2017-10-21 – 2017-10-23 (×2): 650 mg via ORAL
  Filled 2017-10-20 (×3): qty 2

## 2017-10-20 MED ORDER — CYCLOBENZAPRINE HCL 5 MG PO TABS
5.0000 mg | ORAL_TABLET | Freq: Every day | ORAL | Status: DC
  Administered 2017-10-20 – 2017-10-21 (×2): 5 mg via ORAL
  Filled 2017-10-20 (×2): qty 1

## 2017-10-20 NOTE — ED Provider Notes (Signed)
[facility] EMERGENCY DEPARTMENT Provider Note   CSN: [id number] Arrival date & time: 10/20/17  1320     History   Chief Complaint Chief Complaint  Patient presents with  . Generalized Body Aches  . Chest Pain    HPI Amber [name] is a 61 y.o. female.  HPI   61 year old female history of chronic pain, diabetes, stroke, hypertension, and seizures presents today with complaints of lower chest and upper abdominal pain that she describes as epigastric and radiating out bilaterally.  This pain began 3-4 days ago.  She reports 3-4 episodes of rectal bleeding with blood in her stool over the past several days.  She has had two syncopal episodes with the first on Thanksgiving and the second 2 days ago.  She reports falling and striking her lower anterior chest and her head.    She has not had associated nausea, or vomiting.  It is not made worse by food.  She is unable to me anything that makes it worse.  She denies associated dyspnea.  Past Medical History:  Diagnosis Date  . Arthritis   . Asthma   . Chronic pain   . Constipation   . Diabetes mellitus without complication (HCC)   . Embolic stroke (HCC)   . Falls frequently   . GERD (gastroesophageal reflux disease)   . Hypertension   . Pneumonia 10/2015  . Seizures [facility])    last seizure March 2015    Patient Active Problem List   Diagnosis Date Noted  . PFO (patent foramen ovale) 02/15/2017  . Impacted fracture 02/15/2017  . History of CVA with residual deficit 02/15/2017  . Drug-induced hyperglycemia 02/15/2017  . Altered mental status   . Chest pain 11/23/2016  . CVA (cerebral vascular accident) (HCC) 10/28/2016  . Tachycardia   . Seizure (HCC) 10/26/2016  . Embolic stroke (HCC) 10/26/2016  . New onset atrial fibrillation (HCC) 10/26/2016  . 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 Focal seizure (HCC)   . Shoulder pain, right   . Duodenitis   . Abdominal pain 11/07/2015    . Constipation 11/07/2015  . Abdominal distension   . Chest pain, musculoskeletal 08/24/2015  . Diabetes mellitus type 2, controlled (HCC) 07/27/2015  . Chronic back pain   . Frequent falls   . Essential hypertension   . Pulmonary hypertension (HCC)   . Gout   . Asthma 06/12/2015  . ACS (acute coronary syndrome) (HCC) 06/12/2015  . Microcytic anemia 01/26/2015  . History of seizures 01/26/2015    Past Surgical History:  Procedure Laterality Date  . APPENDECTOMY    . BACK SURGERY    . CHOLECYSTECTOMY    . TEE WITHOUT CARDIOVERSION N/A 10/30/2016   Procedure: TRANSESOPHAGEAL ECHOCARDIOGRAM (TEE);  Surgeon: Lars Masson, MD;  Location: [facility] ENDOSCOPY;  Service: Cardiovascular;  Laterality: N/A;    OB History    No data available       Home Medications    Prior to Admission medications   Medication Sig Start Date End Date Taking? Authorizing Provider  albuterol (PROVENTIL HFA;VENTOLIN HFA) 108 (90 BASE) MCG/ACT inhaler Inhale 2 puffs into the lungs every 6 (six) hours as needed for wheezing or shortness of breath.    [provider]  allopurinol (ZYLOPRIM) 300 MG tablet Take 300 mg by mouth daily. 11/11/16   [provider]  amLODipine (NORVASC) 5 MG tablet Take 5 mg by mouth daily.    [provider]  aspirin EC  325 MG EC tablet Take 1 tablet (325 mg total) by mouth daily. 10/31/16   Richarda Overlie, MD  atorvastatin (LIPITOR) 10 MG tablet Take 10 mg by mouth daily. 08/13/15   [provider]  diazepam (VALIUM) 5 MG tablet Take 0.5 tablets (2.5 mg total) by mouth 2 (two) times daily. 10/31/16   Richarda Overlie, MD  feeding supplement, ENSURE ENLIVE, (ENSURE ENLIVE) LIQD Take 237 mLs by mouth 2 (two) times daily between meals. 11/10/15   Catarina Hartshorn, MD  Fluticasone-Salmeterol (ADVAIR) 250-50 MCG/DOSE AEPB Inhale 1 puff into the lungs 2 (two) times daily.    [provider]  folic acid (FOLVITE) 1 MG tablet Take 1 mg by mouth daily.     [provider]  gabapentin (NEURONTIN) 300 MG capsule Take 2 capsules (600 mg total) by mouth 3 (three) times daily. 04/16/17 05/01/17  Alvira Monday, MD  lacosamide 100 MG TABS Take 1 tablet (100 mg total) by mouth 2 (two) times daily. 10/31/16   Richarda Overlie, MD  lactulose (CHRONULAC) 10 GM/15ML solution Take 30 mLs (20 g total) by mouth 2 (two) times daily as needed for severe constipation. 08/22/16   Jacalyn Lefevre, MD  LINZESS 145 MCG CAPS capsule Take 1 capsule (145 mcg total) by mouth daily as needed (constipation). Patient not taking: Reported on 12/18/2016 05/26/16   Penny Pia, MD  loperamide (IMODIUM) 2 MG capsule Take 1 capsule (2 mg total) by mouth 4 (four) times daily as needed for diarrhea or loose stools. 04/16/17   Alvira Monday, MD  metFORMIN (GLUCOPHAGE-XR) 500 MG 24 hr tablet Take 500 mg by mouth daily. 11/11/16   [provider]  methocarbamol (ROBAXIN) 500 MG tablet Take 1 tablet (500 mg total) by mouth every 6 (six) hours as needed for muscle spasms. 04/16/17   Alvira Monday, MD  metoprolol succinate (TOPROL-XL) 50 MG 24 hr tablet Take 1 tablet (50 mg total) by mouth daily. Patient taking differently: Take 50 mg by mouth 2 (two) times daily.  11/25/16 12/25/16  Richarda Overlie, MD  oxyCODONE (OXY IR/ROXICODONE) 5 MG immediate release tablet Take 20 mg by mouth 4 (four) times daily as needed for severe pain.     [provider]  pantoprazole (PROTONIX) 40 MG tablet Take 1 tablet (40 mg total) by mouth 2 (two) times daily before a meal. 12/25/15   Rai, Ripudeep K, MD  potassium chloride 20 MEQ TBCR Take 40 mEq by mouth daily. Patient taking differently: Take 20 mEq by mouth 2 (two) times daily.  11/25/16 12/25/16  Richarda Overlie, MD  predniSONE (DELTASONE) 10 MG tablet Take 15 mg by mouth daily with breakfast.    [provider]  sucralfate (CARAFATE) 1 g tablet Take 1 tablet (1 g total) by mouth 4 (four) times daily -  with meals and at bedtime.  12/18/16   Gwyneth Sprout, MD  torsemide (DEMADEX) 10 MG tablet Take 10 mg by mouth daily.    [provider]  Vitamin D, Ergocalciferol, (DRISDOL) 50000 UNITS CAPS capsule Take 1 capsule (50,000 Units total) by mouth every 7 (seven) days. Sunday Patient taking differently: Take 50,000 Units by mouth every 7 (seven) days. Monday 01/27/15   Calvert Cantor, MD    Family History Family History  Problem Relation Age of Onset  . Kidney failure Mother   . Hypertension Sister   . Hypertension Brother     Social History Social History   Tobacco Use  . Smoking status: Former Smoker  Last attempt to quit: 2010    Years since quitting: 8.9  . Smokeless tobacco: Never Used  Substance Use Topics  . Alcohol use: No    Comment: admits to 2 drinks/week  . Drug use: No     Allergies   Ivp dye [iodinated diagnostic agents]   Review of Systems Review of Systems   Physical Exam Updated Vital Signs BP 117/88 (BP Location: Right Arm)   Pulse 85   Temp 98.3 F (36.8 C) (Oral)   Resp 12   SpO2 97%   Physical Exam  Constitutional: She is oriented to person, place, and time. She appears well-developed and well-nourished.  HENT:  Head: Normocephalic.    Eyes: Pupils are equal, round, and reactive to light.  Neck: Trachea normal, normal range of motion and full passive range of motion without pain. No thyroid mass and no thyromegaly present.  Cardiovascular: Regular rhythm.    Pulmonary/Chest: Effort normal and breath sounds normal.  Abdominal: Soft. She exhibits distension. She exhibits no mass. There is no tenderness. There is no rebound.  Decreased bowel sounds, nttp  Musculoskeletal: Normal range of motion.  Neurological: She is alert and oriented to person, place, and time. No cranial nerve deficit.  Skin: Skin is warm. Capillary refill takes less than 2 seconds.  Nursing note and vitals reviewed.    ED Treatments / Results  Labs (all labs ordered are listed,  but only abnormal results are displayed) Labs Reviewed  BASIC METABOLIC PANEL - Abnormal; Notable for the following components:      Result Value   Chloride 114 (*)    CO2 20 (*)    Creatinine, Ser 1.06 (*)    GFR calc non Af Amer 56 (*)    All other components within normal limits  CBC - Abnormal; Notable for the following components:   RBC 3.39 (*)    Hemoglobin 7.9 (*)    HCT 25.7 (*)    MCV 75.8 (*)    MCH 23.3 (*)    RDW 20.1 (*)    All other components within normal limits  URINALYSIS, ROUTINE W REFLEX MICROSCOPIC  HEPATIC FUNCTION PANEL  LIPASE, BLOOD  I-STAT TROPONIN, ED  I-STAT CG4 LACTIC ACID, ED  CBG MONITORING, ED  TYPE AND SCREEN  ABO/RH    EKG  EKG Interpretation  Date/Time:  Saturday October 20 2017 13:54:40 EST Ventricular Rate:  70 PR Interval:  118 QRS Duration: 60 QT Interval:  402 QTC Calculation: 434 R Axis:   35 Text Interpretation:  Normal sinus rhythm Nonspecific T wave abnormality Abnormal ECG Confirmed by Margarita Grizzle [phone]) on 10/20/2017 2:38:42 PM       Radiology Dg Chest Port 1 View  Result Date: 10/20/2017 CLINICAL DATA:  Rib pain.  Abdominal pain.  Swelling of the feet. EXAM: PORTABLE CHEST 1 VIEW COMPARISON:  December 18, 2016 FINDINGS: Spinal hardware stable. Cardiomegaly. The hila and mediastinum are normal. Healed right rib fractures. No pneumothorax. No nodule or mass. Scarring or atelectasis in the left base. Suggested mild pulmonary venous congestion without overt edema. No other acute abnormalities. IMPRESSION: Probable mild pulmonary venous congestion. Probable scarring or atelectasis in the left base. No other acute abnormalities. Electronically Signed   By: Gerome Sam III M.D   On: 10/20/2017 14:56    Procedures Procedures (including critical care time)  Medications Ordered in ED Medications - No data to display   Initial Impression / Assessment and Plan / ED Course  I have reviewed  the triage vital signs and the  nursing notes.  Pertinent labs & imaging results that were available during my care of the patient were reviewed by me and considered in my medical decision making (see chart for details).     61 y.o. Female on anticoagulation presents with c.o. Rectal bleeding and falls.  She is hemodynamically stable here but anemic with hgb of 7.9.  Stool yellow without gross blood.  CT head pending- obtained due to fall,head trauma on anticoagulation.  CT abdomen chest without contrast pending due to pain anterior chest after fall.   Will need admission for rectal bleeding after cts resulted.  HGb 12 at [facility] 08/10/17 Urine and hemocult pending. LFT and lipase pending Discussed with Dr. Ranae Palms and he will follow up as above and disposition to admitting doctors.   Final Clinical Impressions(s) / ED Diagnoses   Final diagnoses:  Rectal bleeding  Symptomatic anemia  Fall, initial encounter  Chronic anticoagulation    ED Discharge Orders    None       Margarita Grizzle, MD 10/20/17 1601

## 2017-10-20 NOTE — ED Triage Notes (Signed)
Pt not helpful with triage. Pt son unsure of complaints as well. States rib pain, abdominal pain, feet swelling, rectal bleeding that cleared up, generalized weakness, headache for 4 days. Also pt fell. Pt states she bumped her head. Pt is on blood thinners.

## 2017-10-20 NOTE — H&P (Signed)
Date: 10/20/2017               Patient Name:  Amber [name] MRN: [id number]  DOB: May 19, 1956 Age / Sex: 61 y.o., [name]   PCP: [name], MD         Medical Service: Internal Medicine Teaching Service         Attending Physician: Dr. Inez Catalina, MD    First Contact: Dr. Crista Elliot Pager: [phone]  Second Contact: Dr. Mikey Bussing Pager: [phone]       After Hours (After 5p/  First Contact Pager: [phone]  weekends / holidays): Second Contact Pager: [phone]   Chief Complaint: I feel into my tub and hurt my ribs  History of Present Illness: Amber [name] is a 61 year old [name] who presents today for evaluation of her lower chest upper abdominal rib pain secondary to a mechanical fall w/o loss of consciousness. 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 She stated that approximately three days prior she was walking into her bathroom, tripped and fell into the tub striking her lower ribs and upper abdomen on the side of the tub. She denied LOC, striking her head, lightheadedness, dizziness, or recent illness. She stated that she often falls since her stroke, does not loose consciousness. She states that she fell before this on Thanksgiving day of this year and at that time believes she may have lost consciousness. She did not recall that event clearly believing to have potentially fall asleep while down. She has self treated the pain with he home dose of Oxycodone 20mg  without success.    In addition she stated that she experienced three episodes of blood visible on her TP and some mild streaking on her stool. She denied blood in the toilet bowel or red colored water. These had occurred after an episode of constipation and were not painful. Care Everywhere search revealed that the patient went to [facility] for  evaluation of rectal bleeding on 11/29 although patient stated that she did not seek help for this prior to today. Perhaps because she left without being seen but not before a CBC was taken, Hgb was 8.1 on that encounter and is again 7.9 today. From 11/29 to 12/01 she has had no significant decrease in Hgb. In addition her FOBT was negative this visit.   Patient denied only vomiting, black stool, melena, blood in the toilet bowel. She attested to fever, nausea, diarrhea and constipation, pain in her legs since the fall that is nonspecific and unable to be localized to her feet, calf's, knees, thighs, back or hips.  In the ED, CT chest and abdomin failed to reveal acute abnormalities but did demonstrate chronic but stable post surgical spine changes and stable chronic rib fractures. She has chronic T12, L1 and L3 compression fractures but not noted to have acutely changed. CBC indicated Hgb 7.9, MCV 75.8 w/ WBC 7.5. Cr 1.06, slightly increased from baseline of ~0.95. Lactic acid was 0.84, troponin negative, UA positive for protein, normal lipase. 14/01 was called for admission.  Meds:  No outpatient medications have been marked as taking for the 10/20/17 encounter [facility] Encounter).    Allergies: Allergies as of 10/20/2017 - Review Complete 10/20/2017  Allergen Reaction Noted  . Ivp dye [iodinated diagnostic agents] Itching 05/29/2014  . Metrizamide Itching 05/29/2014   Past Medical History:  Diagnosis Date  . Arthritis   . Asthma   . Chronic pain   . Constipation   . Diabetes mellitus without complication (HCC)   . Embolic stroke (HCC)   . Falls frequently   . GERD (gastroesophageal reflux disease)   . Hypertension   . Pneumonia 10/2015  . Seizures (HCC)    last seizure March 2015    Family History:  Mother-HTN, DMII Father-Deceased at 77yo.  Social History:  Social History   Socioeconomic History  . Marital status: Single    Spouse name: Not on file  . Number of  children: Not on file  . Years of education: Not on file  . Highest education level: Not on file  Social Needs  . Financial resource strain: Not on file  . Food insecurity - worry: Not on file  . Food insecurity - inability: Not on file  . Transportation needs - medical: Not on file  . Transportation needs - non-medical: Not on file  Occupational History  . Not on file  Tobacco Use  . Smoking status: Former Smoker    Last attempt to quit: 2010    Years since quitting: 8.9  . Smokeless tobacco: Never Used  Substance and Sexual Activity  . Alcohol use: No    Comment: admits to 2 drinks/week  . Drug use: No  . Sexual activity: No  Other Topics Concern  . Not on file  Social History Narrative  . Not on file    Review of Systems: A complete ROS was negative except as per HPI.   Physical Exam: Blood pressure 122/89, pulse 79, temperature 98.3 F (36.8 C), temperature source Oral, resp. rate 17, SpO2 100 %.   Physical Exam  Constitutional: She is oriented to person, place, and time. She appears well-developed and well-nourished. No distress.  HENT:  Head: Normocephalic and atraumatic.  Eyes: Conjunctivae and EOM are normal.  Cardiovascular: Normal rate and regular rhythm.  Murmur heard. Pulmonary/Chest: Effort normal and breath sounds normal. No stridor. No respiratory distress.  Abdominal: Soft. Bowel sounds are normal. She exhibits no distension. There is no tenderness.  Musculoskeletal: She exhibits no edema or tenderness.       Arms:      Right lower leg: She exhibits no edema.       Left lower leg: She exhibits no edema.  Bilateral chest pain on minimal palpation, even brushing the skin induces severe pain. Generalized non specific and unable to localize lower extremity pain.   Neurological: She is alert and oriented to person, place, and time.  Skin: Skin is warm. Capillary refill takes less than 2 seconds. She is not diaphoretic.  Psychiatric: She has a normal mood  and affect.  Vitals reviewed.   EKG: personally reviewed my interpretation is normal sinus rhythm w/ minor Q-wave abnormalities   CXR: personally reviewed my interpretation is that there are healed rib fractures of the 6-7th?    Assessment & Plan by Problem: Principal Problem:   Rib pain  Amber [name] is a 61yof who presented with acute lower rib and upper abdomin pain following a fall. She denied a significant history of GI blood loss with negative FOBT. Hgb is stable as it was 8.1 three days prior at [facility] and typically ranges between 7-10 as per Care Everywhere review with one note indicating required blood transfusion for continued bleed while on rivaroxaban. Patients vitals are stable with BP 122/89, pulse 79, afebrile, and saturating at 100% on room  air w/ RR 17. She does not appear to be symptomatic from the chronic anemia at this time.   Bilateral Lower Rib Pain: S/p fall into hard object with subsequent pain. CT chest unremarkable for acute changes with numerous chronic changes, post surgical spinal stabilization, chronic rib fractures unchanged, and absent evidence of acute cardiopulmonary abnormality.  Following meds for chronic pain control: Will continue  - Pain control with home dose of Oxy IR 20mg  Q6hrs - Gabapentin 600mg  TID-post surgical radiculopathy? - Cyclobenzaprine 5mg  PO QHS - Lidocaine patch as needed  History of falls: Most likely secondary to multiple CVA's. CT head consistent with previous MRI and CT imaging referencing remote infarcts. No acute changes or symptoms consistent with stroke observed.   Chronic Anemia: Patient has a history of chronic anemia with no evidence of acute blood loss.  - Will repeat CBC in am to confirm - Iron studies, but given MCV this is most likely iron deficiency anemia - Transfuse if below 7mg /dL Hgb only  Asthma: On prednisone as per patient at home for chronic asthma. Currently asymptomatic.   Prednisone 15mg   daily   HTN: Amlodipine 5mg  PO daily at home dose  Diet: Code: Full DVT PPX: SCD's Fluids: Dispo: Admit patient to Observation with expected length of stay less than 2 midnights.  Signed: , MD 10/20/2017, 7:11 PM  Pager: Pager# [phone]

## 2017-10-20 NOTE — ED Notes (Signed)
Occult stool specimen collected by MD

## 2017-10-21 ENCOUNTER — Encounter (HOSPITAL_COMMUNITY): Payer: Self-pay

## 2017-10-21 ENCOUNTER — Observation Stay (HOSPITAL_COMMUNITY): Payer: Medicaid Other

## 2017-10-21 DIAGNOSIS — Z8249 Family history of ischemic heart disease and other diseases of the circulatory system: Secondary | ICD-10-CM | POA: Diagnosis not present

## 2017-10-21 DIAGNOSIS — Z9049 Acquired absence of other specified parts of digestive tract: Secondary | ICD-10-CM | POA: Diagnosis not present

## 2017-10-21 DIAGNOSIS — W19XXXA Unspecified fall, initial encounter: Secondary | ICD-10-CM | POA: Diagnosis not present

## 2017-10-21 DIAGNOSIS — W182XXA Fall in (into) shower or empty bathtub, initial encounter: Secondary | ICD-10-CM | POA: Diagnosis present

## 2017-10-21 DIAGNOSIS — G8929 Other chronic pain: Secondary | ICD-10-CM | POA: Diagnosis present

## 2017-10-21 DIAGNOSIS — R079 Chest pain, unspecified: Secondary | ICD-10-CM | POA: Diagnosis present

## 2017-10-21 DIAGNOSIS — K219 Gastro-esophageal reflux disease without esophagitis: Secondary | ICD-10-CM | POA: Diagnosis present

## 2017-10-21 DIAGNOSIS — R296 Repeated falls: Secondary | ICD-10-CM | POA: Diagnosis present

## 2017-10-21 DIAGNOSIS — M4856XA Collapsed vertebra, not elsewhere classified, lumbar region, initial encounter for fracture: Secondary | ICD-10-CM | POA: Diagnosis present

## 2017-10-21 DIAGNOSIS — I10 Essential (primary) hypertension: Secondary | ICD-10-CM | POA: Diagnosis present

## 2017-10-21 DIAGNOSIS — I272 Pulmonary hypertension, unspecified: Secondary | ICD-10-CM | POA: Diagnosis present

## 2017-10-21 DIAGNOSIS — Z7952 Long term (current) use of systemic steroids: Secondary | ICD-10-CM | POA: Diagnosis not present

## 2017-10-21 DIAGNOSIS — Z833 Family history of diabetes mellitus: Secondary | ICD-10-CM | POA: Diagnosis not present

## 2017-10-21 DIAGNOSIS — Z91041 Radiographic dye allergy status: Secondary | ICD-10-CM | POA: Diagnosis not present

## 2017-10-21 DIAGNOSIS — Z841 Family history of disorders of kidney and ureter: Secondary | ICD-10-CM | POA: Diagnosis not present

## 2017-10-21 DIAGNOSIS — M1990 Unspecified osteoarthritis, unspecified site: Secondary | ICD-10-CM | POA: Diagnosis present

## 2017-10-21 DIAGNOSIS — R569 Unspecified convulsions: Secondary | ICD-10-CM | POA: Diagnosis present

## 2017-10-21 DIAGNOSIS — J45909 Unspecified asthma, uncomplicated: Secondary | ICD-10-CM | POA: Diagnosis present

## 2017-10-21 DIAGNOSIS — S2241XA Multiple fractures of ribs, right side, initial encounter for closed fracture: Secondary | ICD-10-CM | POA: Diagnosis present

## 2017-10-21 DIAGNOSIS — Z7982 Long term (current) use of aspirin: Secondary | ICD-10-CM | POA: Diagnosis not present

## 2017-10-21 DIAGNOSIS — R0781 Pleurodynia: Secondary | ICD-10-CM | POA: Diagnosis not present

## 2017-10-21 DIAGNOSIS — E876 Hypokalemia: Secondary | ICD-10-CM | POA: Diagnosis present

## 2017-10-21 DIAGNOSIS — Z7951 Long term (current) use of inhaled steroids: Secondary | ICD-10-CM | POA: Diagnosis not present

## 2017-10-21 DIAGNOSIS — D509 Iron deficiency anemia, unspecified: Secondary | ICD-10-CM | POA: Diagnosis present

## 2017-10-21 DIAGNOSIS — E119 Type 2 diabetes mellitus without complications: Secondary | ICD-10-CM | POA: Diagnosis present

## 2017-10-21 DIAGNOSIS — Z87891 Personal history of nicotine dependence: Secondary | ICD-10-CM | POA: Diagnosis not present

## 2017-10-21 DIAGNOSIS — Z8673 Personal history of transient ischemic attack (TIA), and cerebral infarction without residual deficits: Secondary | ICD-10-CM | POA: Diagnosis not present

## 2017-10-21 DIAGNOSIS — Z7984 Long term (current) use of oral hypoglycemic drugs: Secondary | ICD-10-CM | POA: Diagnosis not present

## 2017-10-21 LAB — BASIC METABOLIC PANEL
ANION GAP: 7 (ref 5–15)
Anion gap: 6 (ref 5–15)
BUN: 10 mg/dL (ref 6–20)
BUN: 10 mg/dL (ref 6–20)
CALCIUM: 8.3 mg/dL — AB (ref 8.9–10.3)
CHLORIDE: 113 mmol/L — AB (ref 101–111)
CO2: 21 mmol/L — ABNORMAL LOW (ref 22–32)
CO2: 20 mmol/L — ABNORMAL LOW (ref 22–32)
Calcium: 8.5 mg/dL — ABNORMAL LOW (ref 8.9–10.3)
Chloride: 114 mmol/L — ABNORMAL HIGH (ref 101–111)
Creatinine, Ser: 0.98 mg/dL (ref 0.44–1.00)
Creatinine, Ser: 1.02 mg/dL — ABNORMAL HIGH (ref 0.44–1.00)
GFR calc Af Amer: >60 mL/min (ref >60)
GFR calc Af Amer: >60 mL/min (ref >60)
GFR, EST NON AFRICAN AMERICAN: 59 mL/min — AB (ref >60)
GLUCOSE: 109 mg/dL — AB (ref 65–99)
GLUCOSE: 120 mg/dL — AB (ref 65–99)
POTASSIUM: 2.8 mmol/L — AB (ref 3.5–5.1)
POTASSIUM: 3 mmol/L — AB (ref 3.5–5.1)
Sodium: 141 mmol/L (ref 135–145)
Sodium: 140 mmol/L (ref 135–145)

## 2017-10-21 LAB — IRON AND TIBC
Iron: 14 ug/dL — ABNORMAL LOW (ref 28–170)
SATURATION RATIOS: 6 % — AB (ref 10.4–31.8)
TIBC: 235 ug/dL — AB (ref 250–450)
UIBC: 221 ug/dL

## 2017-10-21 LAB — CBC
HEMATOCRIT: 25 % — AB (ref 36.0–46.0)
Hemoglobin: 7.7 g/dL — ABNORMAL LOW (ref 12.0–15.0)
MCH: 23 pg — AB (ref 26.0–34.0)
MCHC: 30.8 g/dL (ref 30.0–36.0)
MCV: 74.6 fL — AB (ref 78.0–100.0)
PLATELETS: 147 10*3/uL — AB (ref 150–400)
RBC: 3.35 MIL/uL — AB (ref 3.87–5.11)
RDW: 19.8 % — ABNORMAL HIGH (ref 11.5–15.5)
WBC: 4.7 10*3/uL (ref 4.0–10.5)

## 2017-10-21 LAB — GLUCOSE, CAPILLARY
GLUCOSE-CAPILLARY: 88 mg/dL (ref 65–99)
Glucose-Capillary: 123 mg/dL — ABNORMAL HIGH (ref 65–99)
Glucose-Capillary: 69 mg/dL (ref 65–99)
Glucose-Capillary: 197 mg/dL — ABNORMAL HIGH (ref 65–99)
Glucose-Capillary: 183 mg/dL — ABNORMAL HIGH (ref 65–99)

## 2017-10-21 LAB — FERRITIN: Ferritin: 22 ng/mL (ref 11–307)

## 2017-10-21 MED ORDER — POTASSIUM CHLORIDE CRYS ER 20 MEQ PO TBCR
20.0000 meq | EXTENDED_RELEASE_TABLET | Freq: Two times a day (BID) | ORAL | Status: AC
  Administered 2017-10-21 – 2017-10-22 (×4): 20 meq via ORAL
  Filled 2017-10-21 (×3): qty 1

## 2017-10-21 MED ORDER — OXYCODONE HCL 5 MG PO TABS
10.0000 mg | ORAL_TABLET | Freq: Four times a day (QID) | ORAL | Status: DC | PRN
  Administered 2017-10-21 – 2017-10-23 (×8): 10 mg via ORAL
  Filled 2017-10-21 (×9): qty 2

## 2017-10-21 MED ORDER — NALOXONE HCL 0.4 MG/ML IJ SOLN
INTRAMUSCULAR | Status: AC
  Filled 2017-10-21: qty 1

## 2017-10-21 MED ORDER — PANTOPRAZOLE SODIUM 40 MG PO TBEC
40.0000 mg | DELAYED_RELEASE_TABLET | Freq: Every day | ORAL | Status: DC
  Administered 2017-10-21 – 2017-10-22 (×2): 40 mg via ORAL
  Filled 2017-10-21 (×2): qty 1

## 2017-10-21 MED ORDER — POTASSIUM CHLORIDE 10 MEQ/100ML IV SOLN
10.0000 meq | INTRAVENOUS | Status: AC
  Administered 2017-10-21 (×4): 10 meq via INTRAVENOUS
  Filled 2017-10-21 (×4): qty 100

## 2017-10-21 MED ORDER — METOPROLOL SUCCINATE ER 50 MG PO TB24
50.0000 mg | ORAL_TABLET | Freq: Every day | ORAL | Status: DC
  Administered 2017-10-21: 50 mg via ORAL
  Filled 2017-10-21: qty 1

## 2017-10-21 MED ORDER — POTASSIUM CHLORIDE 10 MEQ/100ML IV SOLN: 10.0000 meq | INTRAVENOUS | Status: DC

## 2017-10-21 MED ORDER — KETOROLAC TROMETHAMINE 15 MG/ML IJ SOLN
15.0000 mg | Freq: Four times a day (QID) | INTRAMUSCULAR | Status: AC | PRN
  Administered 2017-10-21: 15 mg via INTRAVENOUS
  Filled 2017-10-21: qty 1

## 2017-10-21 NOTE — Plan of Care (Signed)
  Pain Managment: General experience of comfort will improve Complained of severe pain that awakened out of sleep, notified team ordered Toradol prn. Given and patient was able to sleep but awakened again with pain right chest/back given tylenol. Patient also given heating packs to help relieve pain.  10/21/2017 0745 - Progressing by Burtis Junes, RN

## 2017-10-21 NOTE — Progress Notes (Signed)
Patient was hard to arouse and the charge nurse was able to get a response. Rapid response was called she got her to fully wake up and speak. The patient is still drowsy and continuously falling asleep. The patient stated this is usual for her. MD was notified and oxycodone was discontinued.

## 2017-10-21 NOTE — Progress Notes (Signed)
Checked in on patient during lunch.  She was more awake.  More mobile in her LE, but still with limited movement which she attributes to burning pain.  I would consider the team to increase gabapentin dosing if she is able and to perform MRI of T and L spine.  She reports symptoms have been worsening for 3-4 weeks, which does not fit with her recent fall, but she does report falling around Thanksgiving and chronic falls.   Debe Coder, MD

## 2017-10-21 NOTE — Progress Notes (Signed)
   Subjective: Patient was sitting up in bed but very somnolent during the room today.  She followed commands with regard to the physical exam and evaluation, monitor her head appropriately to questions, but would otherwise not answer questions.  She is just been given a dose of her oxycodone 20 mg.  Revisited the patient: She was much more alert and oriented at that point. She lifted each leg, but with difficulty. Was able to discuss her care more and wanted to know what we found.   Objective:  Vital signs in last 24 hours: Vitals:   10/20/17 2015 10/21/17 0620 10/21/17 0832 10/21/17 1144  BP: 114/71 111/77 101/79 91/65  Pulse: 70 81  70  Resp: 17 18  12   Temp: 98.1 F (36.7 C) 98.3 F (36.8 C)    TempSrc: Oral Oral    SpO2: 100% 98%  100%  Weight: 105 lb (47.6 kg)     Height: 5' (1.524 m)      ROS negative except as per HPI.  Physical Exam General: Well-developed, ill-appearing, no acute distress, somnolent with stable vitals Cardiovascular: RRR, grade 2 systolic murmur auscultated Pulmonary: normal effort and breath sounds, no wheezing auscultated Abdomen: tender to palpation but greatly improved from admission Musculoskeletal: Again tender to palpation but greatly improved from admission as well Neurological: She is somnolent, responding to verbal commands but attempting to lift both legs unable to participate  Assessment/Plan:  Principal Problem:   Rib pain   Somnolence:  Concerned she may not have been taking the oxy she was prescribed although she had been filling the scripts and as such that her dose on this admission is too high. Will cut this to Oxy IR 10mg  q6hrs for severe pain and observe overnight while she recovers.   Bilateral lower rib pain: S/p fall without evidence of spinal injury on imaging. -Dedicated thoracic and lumbar x-ray failed to demonstrate acute changes consistent with spinal cord injury today. -Pain control with high home dose of Oxy IR 10mg   q6hrs -Gabapentin 600mg  TID -Cyclobenzaprine 5mg  PO QHS -Lidocaine patch if point tenderness ascertained once initial symptoms improve  History of falls: Most likely secondary to multiple CVA's Please confine to bed.  Chronic anemia: -Hgb 7.9 this am -will repeat tomorrow or if patient becomes symptomatic -Goal to transfer if below 7mg /dL  Asthma: Continued prednisone at home dose of 15mg  daily  HTN:  Hold home dose of amlodipine given BP today Held metoprolol for hypotension as well.   Dispo: Anticipated discharge in approximately 1-2 day(s).   , MD 10/21/2017, 12:20 PM Pager: Pager# [phone]

## 2017-10-21 NOTE — Progress Notes (Signed)
Hypoglycemic Event  CBG: 69  Treatment: 15 GM carbohydrate snack  Symptoms: Hungry  Follow-up CBG: Time: 1328 CBG Result: 88  Possible Reasons for Event: Inadequate meal intake  Comments/MD notified: Patient ate a fruit cup that contained fruit juice.     Amber [name]

## 2017-10-21 NOTE — Progress Notes (Signed)
Late Note  Called by Rn for patient very lethargic and hard to arouse.  RN states received gabapentin and oxy earlier.  On my arrival to patients room, Patient responded to pain and was oriented and able to stay awake and communicate with me.  She states after she takes these meds  At home she gets very sleepy but she is fine.  She denies CP or SOB.  Opted to not give her narcan as per protocol due to her able to hold conversation and stay awake while with her.  Recommend Rn to monitor.

## 2017-10-21 NOTE — Progress Notes (Signed)
MD was notified of patient's request for a muscle relaxer. She is having muscle pain at her ribs and received oxycodone 5 minutes ago. MD wants to wait 10 more minutes to see if the pain medicine helps and wants to wait till the doctors round.

## 2017-10-21 NOTE — Progress Notes (Signed)
Patient admitted from ED with possible gi bleed. Alert/oriented x4. Room air. Immediately assisted to [facility] by NT. Patient has generalized weakness. Voided x1. No stool noted. Complains of pain 8/10 right chest under breast and back. Assessments done, admission education provided and placed on central telemetry. Vitals stable.

## 2017-10-22 ENCOUNTER — Inpatient Hospital Stay (HOSPITAL_COMMUNITY): Payer: Medicaid Other

## 2017-10-22 ENCOUNTER — Encounter (HOSPITAL_COMMUNITY): Payer: Self-pay

## 2017-10-22 LAB — CBC
HCT: 24.3 % — ABNORMAL LOW (ref 36.0–46.0)
Hemoglobin: 7.5 g/dL — ABNORMAL LOW (ref 12.0–15.0)
MCH: 23.1 pg — AB (ref 26.0–34.0)
MCHC: 30.9 g/dL (ref 30.0–36.0)
MCV: 74.8 fL — ABNORMAL LOW (ref 78.0–100.0)
Platelets: 171 10*3/uL (ref 150–400)
RBC: 3.25 MIL/uL — ABNORMAL LOW (ref 3.87–5.11)
RDW: 19.8 % — ABNORMAL HIGH (ref 11.5–15.5)
WBC: 7 10*3/uL (ref 4.0–10.5)

## 2017-10-22 LAB — GLUCOSE, CAPILLARY
GLUCOSE-CAPILLARY: 117 mg/dL — AB (ref 65–99)
GLUCOSE-CAPILLARY: 180 mg/dL — AB (ref 65–99)
GLUCOSE-CAPILLARY: 154 mg/dL — AB (ref 65–99)
Glucose-Capillary: 141 mg/dL — ABNORMAL HIGH (ref 65–99)

## 2017-10-22 LAB — COMPREHENSIVE METABOLIC PANEL
ALBUMIN: 2.4 g/dL — AB (ref 3.5–5.0)
ALT: 15 U/L (ref 14–54)
ANION GAP: 5 (ref 5–15)
AST: 23 U/L (ref 15–41)
Alkaline Phosphatase: 76 U/L (ref 38–126)
BUN: 13 mg/dL (ref 6–20)
CHLORIDE: 116 mmol/L — AB (ref 101–111)
CO2: 19 mmol/L — AB (ref 22–32)
Calcium: 8.5 mg/dL — ABNORMAL LOW (ref 8.9–10.3)
Creatinine, Ser: 1.07 mg/dL — ABNORMAL HIGH (ref 0.44–1.00)
GFR calc non Af Amer: 56 mL/min — ABNORMAL LOW (ref >60)
GLUCOSE: 154 mg/dL — AB (ref 65–99)
POTASSIUM: 4.2 mmol/L (ref 3.5–5.1)
SODIUM: 140 mmol/L (ref 135–145)
Total Bilirubin: 0.2 mg/dL — ABNORMAL LOW (ref 0.3–1.2)
Total Protein: 5.4 g/dL — ABNORMAL LOW (ref 6.5–8.1)

## 2017-10-22 MED ORDER — LIDOCAINE 5 % EX PTCH
1.0000 | MEDICATED_PATCH | CUTANEOUS | Status: DC
  Administered 2017-10-22 – 2017-10-23 (×2): 1 via TRANSDERMAL
  Filled 2017-10-22 (×2): qty 1

## 2017-10-22 MED ORDER — SODIUM CHLORIDE 0.9 % IV SOLN
510.0000 mg | Freq: Once | INTRAVENOUS | Status: AC
  Administered 2017-10-22: 510 mg via INTRAVENOUS
  Filled 2017-10-22 (×2): qty 17

## 2017-10-22 MED ORDER — GADOBENATE DIMEGLUMINE 529 MG/ML IV SOLN: 10.0000 mL | Freq: Once | INTRAVENOUS | Status: DC

## 2017-10-22 MED ORDER — GABAPENTIN 400 MG PO CAPS
800.0000 mg | ORAL_CAPSULE | Freq: Three times a day (TID) | ORAL | Status: DC
  Administered 2017-10-22 – 2017-10-23 (×5): 800 mg via ORAL
  Filled 2017-10-22 (×5): qty 2

## 2017-10-22 NOTE — Evaluation (Signed)
Physical Therapy Evaluation Patient Details Name: Amber [name] MRN: [id number] DOB: 23-Dec-1955 Today's Date: 10/22/2017   History of Present Illness  Pt is a 61y/o female with PMHx of chronic anemia, DM2, stroke, frequent falls, seziures, asthma, chronic back pain s/p surgery in 2014/2015 who presents after a fall 3 days PTA.; MRI shows Stable postsurgical changes status post T1 through T9 fusion and partial corpectomy at T4-6. Suspected chronic myelomalacia at level of corpectomy; Mild to moderate multifactorial spinal stenosis at L4-5 with asymmetric narrowing of the left foramen and possible left L4 nerve root enroachment; CT of the head shows No evidence of acute intracranial abnormality  Clinical Impression  PTA pt mod I with mobility with rollator and required minimal assist for ADLs and iADLs from her grandaughter. PT is currently limited in her mobility by increased pain, generalized weakness and numbness in her bilateral feet. Pt is supervision for bed mobility, and minAx2 for transfers and minA progressing to min guard for assist with ambulating 40 feet with RW. Pt would benefit from HHPT for improving strength and balance and 24/7 supervision with mobility at d/c. PT will continue to follow acutely.     Follow Up Recommendations Home health PT;Supervision/Assistance - 24 hour    Equipment Recommendations  None recommended by PT    Recommendations for Other Services       Precautions / Restrictions Precautions Precautions: Fall Restrictions Weight Bearing Restrictions: No      Mobility  Bed Mobility Overal bed mobility: Needs Assistance Bed Mobility: Supine to Sit     Supine to sit: Supervision     General bed mobility comments: supervision for safety   Transfers Overall transfer level: Needs assistance Equipment used: Rolling walker (2 wheeled) Transfers: Sit to/from Stand Sit to Stand: Min assist;+2 physical assistance;+2 safety/equipment         General  transfer comment: MinA +2 to rise and steady at RW, increased effort, verbal cues for maintaining upright posture; verbal cues for hand placement  Ambulation/Gait Ambulation/Gait assistance: Min assist;+2 physical assistance;Min guard Ambulation Distance (Feet): 40 Feet Assistive device: Rolling walker (2 wheeled) Gait Pattern/deviations: Step-through pattern;Decreased step length - right;Decreased step length - left;Shuffle Gait velocity: slowed Gait velocity interpretation: Below normal speed for age/gender General Gait Details: minAx2 progressing to hands on min guard, vc for sequecing RW and for staying in RW, as well as anterior pelvic tilt and looking up and out      Balance Overall balance assessment: Needs assistance Sitting-balance support: Feet supported Sitting balance-Leahy Scale: Good     Standing balance support: Bilateral upper extremity supported;Single extremity supported Standing balance-Leahy Scale: Poor Standing balance comment: reliant on UE support                              Pertinent Vitals/Pain Pain Assessment: 0-10 Pain Score: 8  Pain Location: bilateral shoulders, bilateral ribs height of previous spinal surgery Pain Descriptors / Indicators: Aching;Pressure Pain Intervention(s): Limited activity within patient's tolerance;Monitored during session    Home Living Family/patient expects to be discharged to:: Private residence Living Arrangements: Other relatives(granddaughter goes to work at 10 pm) Available Help at Discharge: Family;Available PRN/intermittently Type of Home: Apartment Home Access: Stairs to enter   Entrance Stairs-Number of Steps: 5 Home Layout: One level Home Equipment: Tub bench;Walker - 4 wheels;Bedside commode      Prior Function Level of Independence: Needs assistance   Gait / Transfers Assistance Needed: pt ambulates with rollator  but has frequent falls  ADL's / Homemaking Assistance Needed: grandaughter helps  with bathing, cooking, and driving        Hand Dominance   Dominant Hand: Right    Extremity/Trunk Assessment   Upper Extremity Assessment Upper Extremity Assessment: Defer to OT evaluation RUE Deficits / Details: limited shoulder ROM, shoulder flexion approx 0-90*, unable to complete further PROM due to pain; Pt reports numbness in bil hands due to previous strokes  RUE: Unable to fully assess due to pain RUE Coordination: decreased gross motor;decreased fine motor LUE Deficits / Details: limited shoulder ROM, shoulder flexion approx 0-90*, unable to complete further PROM due to pain; Pt reports numbness in bil hands due to previous strokes  LUE: Unable to fully assess due to pain LUE Coordination: decreased fine motor;decreased gross motor    Lower Extremity Assessment Lower Extremity Assessment: Generalized weakness;RLE deficits/detail;LLE deficits/detail RLE Sensation: history of peripheral neuropathy LLE Sensation: history of peripheral neuropathy       Communication   Communication: No difficulties  Cognition Arousal/Alertness: Awake/alert Behavior During Therapy: WFL for tasks assessed/performed Overall Cognitive Status: No family/caregiver present to determine baseline cognitive functioning                                 General Comments: Pt easily distracted, requires redirection to task at hand, some decrecreased safety awareness noted       General Comments General comments (skin integrity, edema, etc.): VSS        Assessment/Plan    PT Assessment Patient needs continued PT services  PT Problem List Decreased strength;Decreased range of motion;Decreased activity tolerance;Decreased balance;Decreased mobility;Pain       PT Treatment Interventions DME instruction;Gait training;Stair training;Functional mobility training;Therapeutic activities;Therapeutic exercise;Balance training;Patient/family education    PT Goals (Current goals can be  found in the Care Plan section)  Acute Rehab PT Goals Patient Stated Goal: regain independence  PT Goal Formulation: With patient Time For Goal Achievement: 11/05/17 Potential to Achieve Goals: Good    Frequency Min 3X/week   Barriers to discharge Decreased caregiver support grandaughter leaves at 10 pm for work       AM-PAC PT "6 Clicks" Daily Activity  Outcome Measure Difficulty turning over in bed (including adjusting bedclothes, sheets and blankets)?: A Little Difficulty moving from lying on back to sitting on the side of the bed? : A Little Difficulty sitting down on and standing up from a chair with arms (e.g., wheelchair, bedside commode, etc,.)?: Unable Help needed moving to and from a bed to chair (including a wheelchair)?: A Little Help needed walking in hospital room?: A Little Help needed climbing 3-5 steps with a railing? : A Lot 6 Click Score: 15    End of Session Equipment Utilized During Treatment: Gait belt Activity Tolerance: Patient tolerated treatment well Patient left: in chair;with call bell/phone within reach;with chair alarm set Nurse Communication: Mobility status PT Visit Diagnosis: Other abnormalities of gait and mobility (R26.89);History of falling (Z91.81);Muscle weakness (generalized) (M62.81);Difficulty in walking, not elsewhere classified (R26.2);Pain Pain - part of body: Shoulder(ribs, back and bilateral shoulders)    Time: 6283-1517 PT Time Calculation (min) (ACUTE ONLY): 27 min   Charges:   PT Evaluation $PT Eval Moderate Complexity: 1 Mod     PT G Codes:        Claretha Townshend B. Beverely Risen PT, DPT Acute Rehabilitation  [phone] Pager [phone]    Elon Alas  Fleet 10/22/2017, 5:39 PM

## 2017-10-22 NOTE — Evaluation (Signed)
Occupational Therapy Evaluation Patient Details Name: Amber [name] MRN: [id number] DOB: 1956-02-14 Today's Date: 10/22/2017    History of Present Illness Pt is a 61y/o female with PMHx of chronic anemia, DM2, stroke, frequent falls, seziures, asthma, chronic back pain s/p surgery in 2014/2015 who presents after a fall 3 days PTA.; MRI shows Stable postsurgical changes status post T1 through T9 fusion and partial corpectomy at T4-6. Suspected chronic myelomalacia at level of corpectomy; Mild to moderate multifactorial spinal stenosis at L4-5 with asymmetric narrowing of the left foramen and possible left L4 nerve root enroachment; CT of the head shows No evidence of acute intracranial abnormality   Clinical Impression   This 61 y/o F presents with the above. At baseline Pt is mod independent with functional mobility using rollator, reports her granddaughter assists with ADLs. Pt currently requires MinA for functional mobility at RW level, requires maxA for LB ADLs secondary to back pain. Pt will benefit from continued acute OT services and recommend additional HHOT services after discharge to maximize Pt's safety and indpendence with ADLs and mobility.     Follow Up Recommendations  Home health OT;Supervision/Assistance - 24 hour    Equipment Recommendations  3 in 1 bedside commode           Precautions / Restrictions Precautions Precautions: Fall Restrictions Weight Bearing Restrictions: No      Mobility Bed Mobility Overal bed mobility: Needs Assistance Bed Mobility: Supine to Sit     Supine to sit: Supervision     General bed mobility comments: supervision for safety   Transfers Overall transfer level: Needs assistance Equipment used: Rolling walker (2 wheeled) Transfers: Sit to/from Stand Sit to Stand: Min assist;+2 physical assistance;+2 safety/equipment         General transfer comment: MinA +2 to rise and steady at RW, increased effort, verbal cues for  maintaining upright posture; verbal cues for hand placement    Balance Overall balance assessment: Needs assistance Sitting-balance support: Feet supported Sitting balance-Leahy Scale: Good     Standing balance support: Bilateral upper extremity supported;Single extremity supported Standing balance-Leahy Scale: Poor Standing balance comment: reliant on UE support                            ADL either performed or assessed with clinical judgement   ADL Overall ADL's : Needs assistance/impaired Eating/Feeding: Set up;Sitting   Grooming: Min guard;Standing   Upper Body Bathing: Min guard;Sitting   Lower Body Bathing: Moderate assistance;Sit to/from stand   Upper Body Dressing : Minimal assistance;Sitting Upper Body Dressing Details (indicate cue type and reason): donning gown  Lower Body Dressing: Maximal assistance;Sit to/from stand Lower Body Dressing Details (indicate cue type and reason): total assist to doff/don socks; Pt with significant difficulty reaching towards feet due to back pain  Toilet Transfer: Ambulation;Minimal assistance;Regular Toilet;RW Toilet Transfer Details (indicate cue type and reason): simulated in transfer to chair  Toileting- Clothing Manipulation and Hygiene: Minimal assistance;Sit to/from stand       Functional mobility during ADLs: Minimal assistance;+2 for safety/equipment;Rolling walker                           Pertinent Vitals/Pain Pain Assessment: 0-10 Pain Score: 8  Pain Location: bilateral shoulders, bilateral ribs height of previous spinal surgery Pain Descriptors / Indicators: Aching;Pressure Pain Intervention(s): Limited activity within patient's tolerance;Monitored during session     Hand Dominance Left   Extremity/Trunk  Assessment Upper Extremity Assessment Upper Extremity Assessment: RUE deficits/detail;LUE deficits/detail RUE Deficits / Details: limited shoulder ROM, shoulder flexion approx 0-90*, unable  to complete further PROM due to pain; Pt reports numbness in bil hands due to previous strokes  RUE: Unable to fully assess due to pain RUE Coordination: decreased gross motor;decreased fine motor LUE Deficits / Details: limited shoulder ROM, shoulder flexion approx 0-90*, unable to complete further PROM due to pain; Pt reports numbness in bil hands due to previous strokes  LUE: Unable to fully assess due to pain LUE Coordination: decreased fine motor;decreased gross motor   Lower Extremity Assessment Lower Extremity Assessment: Defer to PT evaluation       Communication Communication Communication: No difficulties   Cognition Arousal/Alertness: Awake/alert Behavior During Therapy: WFL for tasks assessed/performed Overall Cognitive Status: No family/caregiver present to determine baseline cognitive functioning                                 General Comments: Pt easily distracted, requires redirection to task at hand, some decrecreased safety awareness noted    General Comments                  Home Living Family/patient expects to be discharged to:: Private residence Living Arrangements: Other relatives(granddaughter goes to work at 10 pm) Available Help at Discharge: Family;Available PRN/intermittently Type of Home: Apartment Home Access: Stairs to enter Entrance Stairs-Number of Steps: 5   Home Layout: One level     Bathroom Shower/Tub: Tub/shower unit;Curtain   Firefighter: Standard Bathroom Accessibility: Yes   Home Equipment: Tub bench;Walker - 4 wheels;Bedside commode          Prior Functioning/Environment Level of Independence: Needs assistance  Gait / Transfers Assistance Needed: pt ambulates with rollator but has frequent falls ADL's / Homemaking Assistance Needed: grandaughter helps with bathing, cooking, and driving            OT Problem List: Impaired balance (sitting and/or standing);Pain;Decreased range of motion;Decreased  activity tolerance      OT Treatment/Interventions: Self-care/ADL training;DME and/or AE instruction;Therapeutic activities;Balance training;Therapeutic exercise;Energy conservation;Patient/family education    OT Goals(Current goals can be found in the care plan section) Acute Rehab OT Goals Patient Stated Goal: regain independence  OT Goal Formulation: With patient Time For Goal Achievement: 11/05/17 Potential to Achieve Goals: Good  OT Frequency: Min 2X/week                             AM-PAC PT "6 Clicks" Daily Activity     Outcome Measure Help from another person eating meals?: None Help from another person taking care of personal grooming?: A Little Help from another person toileting, which includes using toliet, bedpan, or urinal?: A Little Help from another person bathing (including washing, rinsing, drying)?: A Lot Help from another person to put on and taking off regular upper body clothing?: A Little Help from another person to put on and taking off regular lower body clothing?: A Lot 6 Click Score: 17   End of Session Equipment Utilized During Treatment: Gait belt;Rolling walker Nurse Communication: Mobility status  Activity Tolerance: Patient tolerated treatment well Patient left: in chair;with call bell/phone within reach;with chair alarm set  OT Visit Diagnosis: Unsteadiness on feet (R26.81);Pain Pain - Right/Left: (bilateral) Pain - part of body: Shoulder(back/rib cage)  Time: 8841-6606 OT Time Calculation (min): 25 min Charges:  OT General Charges $OT Visit: 1 Visit OT Evaluation $OT Eval Low Complexity: 1 Low G-Codes:     Marcy Siren, OT Pager [phone] 10/22/2017   Orlando Penner 10/22/2017, 5:16 PM

## 2017-10-22 NOTE — Progress Notes (Signed)
   Subjective: Patient was an MRI during the first 2 attempts to visit her room.  She states that her pain is slightly increased today although she is much less somnolent.  Patient attested this to being given the Flexeril in combination with the oxycodone 20 and that she would like to be switched back from the 10mg  back to 20mg  as this controls her pain much better. She stated that her feet were again painful which often occurs when she misses a dose of her oxycodone 20mg . She denied receiving benefit from gabapentin.   Objective:  Vital signs in last 24 hours: Vitals:   10/21/17 1144 10/21/17 1345 10/21/17 2245 10/22/17 0602  BP: 91/65 112/81 (!) 115/98 120/78  Pulse: 70 82 82 85  Resp: 12 14 17 17   Temp:  99 F (37.2 C) 98 F (36.7 C) 98 F (36.7 C)  TempSrc:  Oral Oral Oral  SpO2: 100% 98% 100% 100%  Weight:      Height:       ROS negative except as per HPI.  Physical Exam  Constitutional: She is oriented to person, place, and time. She appears well-developed and well-nourished. She does not appear ill.  Cardiovascular: Normal rate and regular rhythm.  No murmur heard. Pulmonary/Chest: Effort normal and breath sounds normal. No respiratory distress.  Abdominal: Soft. Bowel sounds are normal. She exhibits no distension. There is no tenderness.  Musculoskeletal: She exhibits tenderness (To palpation of ~the right 8th rib anteriorly ). She exhibits no edema.  Neurological: She is alert and oriented to person, place, and time.  Skin: Capillary refill takes less than 2 seconds.    Assessment/Plan:  Principal Problem:   Rib pain  61 year old female who was admitted for suspected GI bleed which was promptly ruled out although she appears to have a progressively decreasing Hgb. Stool is negative for heme leading 14/03/18 to believe that this may be worsening of her iron deficiency anemia.  Somnolence: Patient appears much more alert and awake upon being revisited.  He was significantly  less somnolent overnight as well.  We will continue lower dose of OxyIR 10 mg as needed for severe pain. IMPROVED> patient stated she can not take the muscle relaxer and the oxycodone at the same time.   Bilateral lower rib pain: S/P fall with no evidence of spinal injury on CT or x-ray at this time. -Ordered MRI of the T-spine and lumbar spine with contrast to further evaluate spinal cord injury -Continuing cyclobenzaprine 5 mg p.o. nightly -Continuing OxyIR 10 mg every 6 hours -Increase gabapentin 300 mg 3 times daily -MRI completed, failed to demonstrate a definite etiology for her current pain.   History of falls: Most likely secondary to multiple CVAs Please confined to bed for ambulation only with assistance We will place PT OT evaluation if indicated  Chronic anemia: Most likely anemia secondary to chronic iron deficiency given patient's decreased MCV lack of oral intake of iron. -Hemoglobin 7.5 today -We will repeat CBC and transfuse at a of target 7 mg/dL -IV iron  Hypertension: -holding home antihypertensives -We will continue to hold amlodipine and metoprolol due to hypotension at this time  Assessment: -We will continue patient's prednisone 15 mg daily  Dispo: Anticipated discharge in approximately 1-2 day(s).   02-27-2007, MD 10/22/2017, 7:35 AM Pager: Pager# [phone]

## 2017-10-23 DIAGNOSIS — R0781 Pleurodynia: Secondary | ICD-10-CM

## 2017-10-23 DIAGNOSIS — W19XXXA Unspecified fall, initial encounter: Secondary | ICD-10-CM

## 2017-10-23 LAB — GLUCOSE, CAPILLARY
Glucose-Capillary: 113 mg/dL — ABNORMAL HIGH (ref 65–99)
Glucose-Capillary: 144 mg/dL — ABNORMAL HIGH (ref 65–99)
Glucose-Capillary: 163 mg/dL — ABNORMAL HIGH (ref 65–99)

## 2017-10-23 MED ORDER — GABAPENTIN 400 MG PO CAPS: 800.0000 mg | ORAL_CAPSULE | Freq: Three times a day (TID) | ORAL | 0 refills | Status: DC

## 2017-10-23 MED ORDER — WHITE PETROLATUM EX OINT
TOPICAL_OINTMENT | CUTANEOUS | Status: AC
  Administered 2017-10-23: 15:00:00
  Filled 2017-10-23: qty 28.35

## 2017-10-23 MED ORDER — LIDOCAINE 5 % EX PTCH: 1.0000 | MEDICATED_PATCH | CUTANEOUS | 0 refills | Status: DC

## 2017-10-23 NOTE — Progress Notes (Addendum)
   Subjective: The patient was lying in bed comfortably upon entering the room today.  She stated that she was able to use the bedside commode given a decrease in her pain.  Patient says that the pain appears to be improving and is able to sit up without extreme difficulty at this time despite being on a lower dose of oxycodone.  She was informed that given the lack of acute findings of new injury on imaging, including MRI, CT, and x-rays, she would be cleared to discharge home with home PT. Consult to case management/social work for assistance with home health PT services was placed and the patient was notified that discharge was imminent pending this evaluation.  Objective:  Vital signs in last 24 hours: Vitals:   10/22/17 0602 10/22/17 1346 10/22/17 2122 10/23/17 0529  BP: 120/78 114/80 126/83 125/75  Pulse: 85 98 76 76  Resp: 17 18 18 18   Temp: 98 F (36.7 C) 98.5 F (36.9 C) 98.4 F (36.9 C) 98 F (36.7 C)  TempSrc: Oral  Oral Oral  SpO2: 100% 100% 100% 97%  Weight:      Height:       ROS negative except as per HPI.  Physical Exam Constitutional: Patient is well-developed, well-nourished, no acute distress Cardiovascular: Regular rate and rhythm, no murmur auscultated Pulmonary: Effort and breath sounds were normal without wheezing Abdominal: Abdomen soft, tender to palpation in the right and left upper quadrant secondary to the rib pain, nondistended, and soft in the lower quadrants patient relaxes. Musculoskeletal: Distortion was a nonedematous, nontender, Logical: Strength appears intact distal extremities 4 out of 5 bilaterally.  Assessment/Plan:  Principal Problem:   Rib pain  61 year old female who was admitted for suspected GI bleed which was promptly ruled out.  Stool was negative for heme globin was noticed to believe that this may be a chronic worsening of her iron deficiency anemia.  Patient is alert and oriented no longer somnolent:  Bilateral lower rib  pain: Status post fall with history of significant falls in the past. No evidence of spinal cord injury on CT, x-ray, or MRI of the thoracic or lumbar spine. Patient's pain greatly improved since admission plan to discharge today on home oxycodone medication regimen with increased gabapentin as below -OxyIR 20 mg every 6 hours at her home dose -Increase gabapentin to 800 mg 3 times daily -Prescription for lidocaine patch placed on the right lower anterior ribs.  History of falls: This is most likely secondary to multiple CVAs and is chronic in nature.  She utilizes a home walker and assistance as needed for her family. Patient has not fallen since admission but does display a propensity for imbalance.  He was evaluated by PT/OT at which point it was determined that she was in need of home health PT and bedside toilet. SNF placement was not recommended.  Chronic anemia: Most likely anemia of chronic disease versus iron deficiency anemia.  -Hemoglobin 7.5 the prior day -No indication to repeat CBC as patient remains a symptomatic -IV iron given plan to follow-up patient's outpatient clinic in 1 week with CBC  Hypertension: -Holding home antihypertensives -We will continue to hold amlodipine and metoprolol due to hypotension at this time. -She will be discharged home off of her antihypertensives to follow-up with her PCP.  Asthma: -Continue patients home dose prednisone 15mg  daily  Dispo: Anticipated discharge in approximately 0 day(s).   46, MD 10/23/2017, 6:54 AM Pager: Pager# [phone]

## 2017-10-23 NOTE — Discharge Instructions (Signed)
You need to call your primary care doctor, Dr. Wynelle Link, and schedule an appointment with him as soon as possible but no later than Friday.  Please continue to be cautious while using high dose Oxycodone as this can make you very drowsy.  When used in conjunction with the medications such as Flexeril may cause you to be unarousable.  Thank you for your visit to [facility].

## 2017-10-23 NOTE — Progress Notes (Signed)
Patient discharge teaching given, including activity, diet, follow-up appoints, and medications. Patient verbalized understanding of all discharge instructions. IV access was d/c'd. Vitals are stable. Skin is intact except as charted in most recent assessments. Pt to be escorted out by NT, to be driven home by family. 

## 2017-10-23 NOTE — Discharge Summary (Signed)
Name: Amber [name] MRN: [id number] DOB: 1956-07-16 61 y.o. PCP: [name], MD  Date of Admission: 10/20/2017  2:13 PM Date of Discharge: 10/23/2017 Attending Physician: Inez Catalina, MD  Discharge Diagnosis: Principal Problem:   Rib pain Active Problems:   Microcytic anemia   Frequent falls   Fall   Discharge Medications: Allergies as of 10/23/2017      Reactions   Ivp Dye [iodinated Diagnostic Agents] Itching   Metrizamide Itching      Medication List    STOP taking these medications   amLODipine 5 MG tablet Commonly known as:  NORVASC   diazepam 5 MG tablet Commonly known as:  VALIUM   esomeprazole 40 MG capsule Commonly known as:  NEXIUM   furosemide 40 MG tablet Commonly known as:  LASIX   LINZESS 145 MCG Caps capsule Generic drug:  linaclotide   methocarbamol 500 MG tablet Commonly known as:  ROBAXIN   pantoprazole 40 MG tablet Commonly known as:  PROTONIX   sucralfate 1 g tablet Commonly known as:  CARAFATE   tiZANidine 4 MG tablet Commonly known as:  ZANAFLEX     TAKE these medications   albuterol 108 (90 Base) MCG/ACT inhaler Commonly known as:  PROVENTIL HFA;VENTOLIN HFA Inhale 2 puffs into the lungs every 6 (six) hours as needed for wheezing or shortness of breath.   allopurinol 300 MG tablet Commonly known as:  ZYLOPRIM Take 300 mg by mouth daily.   aspirin 325 MG EC tablet Take 1 tablet (325 mg total) by mouth daily.   atorvastatin 10 MG tablet Commonly known as:  LIPITOR Take 10 mg by mouth daily.   ELIQUIS 5 MG Tabs tablet Generic drug:  apixaban Take 5 mg by mouth daily.   feeding supplement (ENSURE ENLIVE) Liqd Take 237 mLs by mouth 2 (two) times daily between meals. What changed:  additional instructions   Fluticasone-Salmeterol 250-50 MCG/DOSE Aepb Commonly known as:  ADVAIR Inhale 1 puff into the lungs 2 (two) times daily.   Fluticasone-Salmeterol 250-50 MCG/DOSE Aepb Commonly known as:  ADVAIR Inhale 2 puffs  into the lungs as needed.   folic acid 1 MG tablet Commonly known as:  FOLVITE Take 1 mg by mouth daily.   gabapentin 400 MG capsule Commonly known as:  NEURONTIN Take 2 capsules (800 mg total) by mouth 3 (three) times daily. What changed:    medication strength  how much to take   Lacosamide 100 MG Tabs Take 1 tablet (100 mg total) by mouth 2 (two) times daily.   lactulose 10 GM/15ML solution Commonly known as:  CHRONULAC Take 30 mLs (20 g total) by mouth 2 (two) times daily as needed for severe constipation.   levETIRAcetam 500 MG tablet Commonly known as:  KEPPRA Take 500 mg by mouth 2 (two) times daily.   lidocaine 5 % Commonly known as:  LIDODERM Place 1 patch onto the skin daily. Remove & Discard patch within 12 hours or as directed by MD   loperamide 2 MG capsule Commonly known as:  IMODIUM Take 1 capsule (2 mg total) by mouth 4 (four) times daily as needed for diarrhea or loose stools.   metFORMIN 500 MG 24 hr tablet Commonly known as:  GLUCOPHAGE-XR Take 500 mg by mouth daily.   metoprolol succinate 50 MG 24 hr tablet Commonly known as:  TOPROL-XL Take 1 tablet (50 mg total) by mouth daily. What changed:  when to take this   oxyCODONE 5 MG immediate release tablet Commonly  known as:  Oxy IR/ROXICODONE Take 20 mg by mouth 4 (four) times daily as needed for severe pain.   Potassium Chloride ER 20 MEQ Tbcr Take 40 mEq by mouth daily. What changed:    how much to take  when to take this   predniSONE 10 MG tablet Commonly known as:  DELTASONE Take 15 mg by mouth daily with breakfast.   torsemide 10 MG tablet Commonly known as:  DEMADEX Take 10 mg by mouth daily.   triamcinolone 0.025 % cream Commonly known as:  KENALOG Apply 1 application topically as needed.   valproic acid 250 MG capsule Commonly known as:  DEPAKENE Take 500 mg by mouth daily.   Vitamin D (Ergocalciferol) 50000 units Caps capsule Commonly known as:  DRISDOL Take 1 capsule  (50,000 Units total) by mouth every 7 (seven) days. Sunday What changed:  additional instructions            Durable Medical Equipment  (From admission, onward)        Start     Ordered   10/23/17 1455  DME tub bench  Once     12 /04/18 1454      Disposition and follow-up:   Ms.Pamelia Nevins was discharged from [facility] in Stable condition.  At the hospital follow up visit please address:  1. A.  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  B. Rib pain from falls: Resolution of these symptoms.  C. Anemia: Initial concern for GI bleed, nothing   2.  Labs / imaging needed at time of follow-up: CBC, CMP  3.  Pending labs/ test needing follow-up: n//a  Follow-up Appointments: Follow-up Information    [name], MD Follow up in 1 week(s).   Specialty:  Internal Medicine Why:  YOU MUST CALL YOUR PHYSICIAN TO SCHEDULE AN APPOINTMENT BY FRIDAY. Contact information: [address] [place] [address] [phone]           Hospital Course by problem list: Principal Problem:   Rib pain Active Problems:   Microcytic anemia   Frequent falls   Fall    1. Bilateral Rib Pain: -The patient stated that she fell approximately 3-4 days prior to this admission.  S  Patient stated that she fell while walking to her bathroom and struck her ribs on the side of her bathtub.  She remained in home self treating with her oxycodone 20 mg for the following 3 days but eventually visited our emergency department for evaluation.  Spinal x-ray, abdominal CT, and eventually spinal MRI thoracic and lumbar regions was performed.  The impression of the spinal MRI as noted below.  There is no evidence of acute findings with regard to spine, ribs, or other bony abnormality that may have  resulted from the fall.  It was felt that the rib pain from a chronic rib fracture (noted in CT and x-ray) was aggravated after striking the tub.  As such, her gabapentin was increased to 800mg  TID for worsening neuropathy and she was given a lidocaine patch for local pain control to be placed overlying her right ribs. The patch appeared to have a minimal yet observable diminutive affect on her pain prompting recommendation to continue using a similar option.   2. 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 However,  concern for polypharmacy is high prompting the recommendation that her medication regimen be closely reviewed for possible consideration for changes as she appears to have multiple interaction potentials. Including concern for sedation and bradycardia/bradypnea while taking the oxycodone 20mg  IR and any muscle relaxant. However, given the severity of her chronic and substantial medical conditions, deferment to her PCP was warranted for long term tapering and/or adjustments. She was advised against taking muscle relaxants in conjunction with her other medications such as the oxycodone. She agreed to avoid this.  3. Chronic Anemia: -Unable to accurately determine an exact baseline but it appears from a record review that her typical hemoglobin is in the 8-10mg /dL range. She was initially admitted for was believed to be a gastrointestinal bleed but the FOBT returned negative she denies significant blood loss.  She stated that instead there is mild streaking of blood on tissue paper occasional stools less than 3 occasions.  She denied overt blood loss, melena, dark tarry stool, bloody sputum or emesis.  She initially omitted the fact that she had been to a [facility] for evaluation but left prior to being roomed by the facility but not before a CBC was performed. Her Hgb was 8.1 at that time, within the margin of error of the 7.9mg /dL on this admission. Her most recent  hemoglobin levels are concerning for worsening iron deficiency anemia vs anemia of chronic disease. TIBC was 235 (range 250-450), iron 14 (range28-170), ferritin 22 (range 11-307) which is most consistent with anemia of chronic disease but not technically ruling out the possibility of iron deficiency as well. As such, she was treated with IV iron and it is recommended that consideration for a CBC be performed within 1 week to determine if she has stabilized and again in 3-4 weeks to determine if mild reversal in her trend has occurred with the IV iron.    4. Hypertension: -The patient was not hypertensive on this admission, instead she was nearly hypotensive with the vitals below being atypical regard to her blood pressure.  She had multiple blood pressures below 100 systolic and 70 diastolic which recovered after holding her amlodipine and metoprolol.  Please consider adjusting her hypertension medication regimen if she continues to be hypotensive following discharge.  Discharge Vitals:   BP 131/82 (BP Location: Left Arm)   Pulse 86   Temp 98.1 F (36.7 C) (Oral)   Resp 18   Ht 5' (1.524 m)   Wt 105 lb (47.6 kg)   SpO2 100%   BMI 20.51 kg/m   Pertinent Labs, Studies, and Procedures:  CBC Latest Ref Rng & Units 10/22/2017 10/21/2017 10/20/2017  WBC 4.0 - 10.5 K/uL 7.0 4.7 7.5  Hemoglobin 12.0 - 15.0 g/dL 7.5(L) 7.7(L) 7.9(L)  Hematocrit 36.0 - 46.0 % 24.3(L) 25.0(L) 25.7(L)  Platelets 150 - 400 K/uL 171 147(L) 161   CMP Latest Ref Rng & Units 10/22/2017 10/21/2017 10/21/2017  Glucose 65 - 99 mg/dL 14/12/2016) 378(H) 885(O)  BUN 6 - 20 mg/dL 13 10 10   Creatinine 0.44 - 1.00 mg/dL 277(A) ) 1.28(N  Sodium 135 - 145 mmol/L 140 140 141  Potassium 3.5 - 5.1 mmol/L 4.2 3.0(L) 2.8(L)  Chloride 101 - 111 mmol/L 116(H) 114(H) 113(H)  CO2 22 - 32 mmol/L 19(L) 20(L) 21(L)  Calcium 8.9 - 10.3 mg/dL 8.67(E) 8.3(L) 8.5(L)  Total Protein 6.5 - 8.1 g/dL 7.20) - -  Total Bilirubin 0.3 - 1.2 mg/dL 9.4(B) -  -  Alkaline Phos 38 - 126 U/L  76 - -  AST 15 - 41 U/L 23 - -  ALT 14 - 54 U/L 15 - -   MR Thoracic and lumbar spine w/o contrast: IMPRESSION: 1. Stable postsurgical changes status post T1 through T9 fusion and partial corpectomy at T4-6. Suspected chronic myelomalacia at the level of the corpectomy. 2. Compression deformities at T12, L1 and L3, stable from recent CT. The lumbar fractures have progressed from 05/04/2016 but appear healed. No acute osseous findings. 3. Mild to moderate multifactorial spinal stenosis at L4-5 with asymmetric narrowing of the left foramen and possible left L4 nerve root encroachment. 4. No other significant spinal stenosis or nerve root encroachment.  Discharge Instructions: Discharge Instructions    Call MD for:  difficulty breathing, headache or visual disturbances   Complete by:  As directed    Call MD for:  persistant dizziness or light-headedness   Complete by:  As directed    Diet - low sodium heart healthy   Complete by:  As directed    Increase activity slowly   Complete by:  As directed        Signed: Lanelle Bal, MD 10/23/2017, 2:54 PM   Pager: Pager# [phone]

## 2017-11-18 ENCOUNTER — Inpatient Hospital Stay (HOSPITAL_COMMUNITY)
Admission: EM | Admit: 2017-11-18 | Discharge: 2017-11-26 | DRG: 315 | Disposition: A | Discharge status: Skilled Nursing Facility | Payer: Medicaid Other | Attending: Oncology | Admitting: Oncology

## 2017-11-18 ENCOUNTER — Encounter (HOSPITAL_COMMUNITY): Payer: Self-pay | Admitting: Emergency Medicine

## 2017-11-18 ENCOUNTER — Emergency Department (HOSPITAL_COMMUNITY): Payer: Medicaid Other

## 2017-11-18 DIAGNOSIS — M48061 Spinal stenosis, lumbar region without neurogenic claudication: Secondary | ICD-10-CM | POA: Diagnosis not present

## 2017-11-18 DIAGNOSIS — T40605A Adverse effect of unspecified narcotics, initial encounter: Secondary | ICD-10-CM | POA: Diagnosis not present

## 2017-11-18 DIAGNOSIS — Z7951 Long term (current) use of inhaled steroids: Secondary | ICD-10-CM

## 2017-11-18 DIAGNOSIS — Z7984 Long term (current) use of oral hypoglycemic drugs: Secondary | ICD-10-CM

## 2017-11-18 DIAGNOSIS — K310 Acute dilatation of stomach: Secondary | ICD-10-CM

## 2017-11-18 DIAGNOSIS — R6510 Systemic inflammatory response syndrome (SIRS) of non-infectious origin without acute organ dysfunction: Secondary | ICD-10-CM

## 2017-11-18 DIAGNOSIS — I959 Hypotension, unspecified: Secondary | ICD-10-CM | POA: Diagnosis not present

## 2017-11-18 DIAGNOSIS — G894 Chronic pain syndrome: Secondary | ICD-10-CM | POA: Diagnosis present

## 2017-11-18 DIAGNOSIS — I4891 Unspecified atrial fibrillation: Secondary | ICD-10-CM | POA: Diagnosis present

## 2017-11-18 DIAGNOSIS — T50905A Adverse effect of unspecified drugs, medicaments and biological substances, initial encounter: Secondary | ICD-10-CM | POA: Diagnosis present

## 2017-11-18 DIAGNOSIS — I119 Hypertensive heart disease without heart failure: Secondary | ICD-10-CM | POA: Diagnosis present

## 2017-11-18 DIAGNOSIS — Z7982 Long term (current) use of aspirin: Secondary | ICD-10-CM

## 2017-11-18 DIAGNOSIS — R569 Unspecified convulsions: Secondary | ICD-10-CM

## 2017-11-18 DIAGNOSIS — M62838 Other muscle spasm: Secondary | ICD-10-CM | POA: Diagnosis present

## 2017-11-18 DIAGNOSIS — J45909 Unspecified asthma, uncomplicated: Secondary | ICD-10-CM | POA: Diagnosis present

## 2017-11-18 DIAGNOSIS — Z8673 Personal history of transient ischemic attack (TIA), and cerebral infarction without residual deficits: Secondary | ICD-10-CM

## 2017-11-18 DIAGNOSIS — I110 Hypertensive heart disease with heart failure: Secondary | ICD-10-CM

## 2017-11-18 DIAGNOSIS — I10 Essential (primary) hypertension: Secondary | ICD-10-CM | POA: Diagnosis present

## 2017-11-18 DIAGNOSIS — I5030 Unspecified diastolic (congestive) heart failure: Secondary | ICD-10-CM

## 2017-11-18 DIAGNOSIS — E872 Acidosis: Secondary | ICD-10-CM | POA: Diagnosis present

## 2017-11-18 DIAGNOSIS — M79604 Pain in right leg: Secondary | ICD-10-CM

## 2017-11-18 DIAGNOSIS — R0602 Shortness of breath: Secondary | ICD-10-CM

## 2017-11-18 DIAGNOSIS — J690 Pneumonitis due to inhalation of food and vomit: Secondary | ICD-10-CM

## 2017-11-18 DIAGNOSIS — D509 Iron deficiency anemia, unspecified: Secondary | ICD-10-CM

## 2017-11-18 DIAGNOSIS — Z87891 Personal history of nicotine dependence: Secondary | ICD-10-CM

## 2017-11-18 DIAGNOSIS — M6281 Muscle weakness (generalized): Secondary | ICD-10-CM | POA: Diagnosis not present

## 2017-11-18 DIAGNOSIS — R296 Repeated falls: Secondary | ICD-10-CM

## 2017-11-18 DIAGNOSIS — E1142 Type 2 diabetes mellitus with diabetic polyneuropathy: Secondary | ICD-10-CM | POA: Diagnosis present

## 2017-11-18 DIAGNOSIS — D62 Acute posthemorrhagic anemia: Secondary | ICD-10-CM | POA: Diagnosis present

## 2017-11-18 DIAGNOSIS — R39198 Other difficulties with micturition: Secondary | ICD-10-CM | POA: Diagnosis not present

## 2017-11-18 DIAGNOSIS — G8929 Other chronic pain: Secondary | ICD-10-CM | POA: Diagnosis present

## 2017-11-18 DIAGNOSIS — E876 Hypokalemia: Secondary | ICD-10-CM | POA: Diagnosis present

## 2017-11-18 DIAGNOSIS — Z9049 Acquired absence of other specified parts of digestive tract: Secondary | ICD-10-CM

## 2017-11-18 DIAGNOSIS — Z79891 Long term (current) use of opiate analgesic: Secondary | ICD-10-CM

## 2017-11-18 DIAGNOSIS — M79605 Pain in left leg: Secondary | ICD-10-CM

## 2017-11-18 DIAGNOSIS — K219 Gastro-esophageal reflux disease without esophagitis: Secondary | ICD-10-CM | POA: Diagnosis present

## 2017-11-18 DIAGNOSIS — D72829 Elevated white blood cell count, unspecified: Secondary | ICD-10-CM | POA: Diagnosis present

## 2017-11-18 DIAGNOSIS — Z981 Arthrodesis status: Secondary | ICD-10-CM

## 2017-11-18 DIAGNOSIS — K56609 Unspecified intestinal obstruction, unspecified as to partial versus complete obstruction: Secondary | ICD-10-CM

## 2017-11-18 DIAGNOSIS — T17910A Gastric contents in respiratory tract, part unspecified causing asphyxiation, initial encounter: Secondary | ICD-10-CM

## 2017-11-18 DIAGNOSIS — R400 Somnolence: Secondary | ICD-10-CM | POA: Diagnosis not present

## 2017-11-18 DIAGNOSIS — K56600 Partial intestinal obstruction, unspecified as to cause: Secondary | ICD-10-CM | POA: Diagnosis not present

## 2017-11-18 DIAGNOSIS — R109 Unspecified abdominal pain: Secondary | ICD-10-CM

## 2017-11-18 DIAGNOSIS — I950 Idiopathic hypotension: Principal | ICD-10-CM | POA: Diagnosis present

## 2017-11-18 DIAGNOSIS — Z7952 Long term (current) use of systemic steroids: Secondary | ICD-10-CM

## 2017-11-18 DIAGNOSIS — D638 Anemia in other chronic diseases classified elsewhere: Secondary | ICD-10-CM | POA: Diagnosis present

## 2017-11-18 DIAGNOSIS — E1140 Type 2 diabetes mellitus with diabetic neuropathy, unspecified: Secondary | ICD-10-CM

## 2017-11-18 DIAGNOSIS — M549 Dorsalgia, unspecified: Secondary | ICD-10-CM

## 2017-11-18 DIAGNOSIS — Z79899 Other long term (current) drug therapy: Secondary | ICD-10-CM

## 2017-11-18 DIAGNOSIS — M069 Rheumatoid arthritis, unspecified: Secondary | ICD-10-CM | POA: Diagnosis present

## 2017-11-18 DIAGNOSIS — Z7901 Long term (current) use of anticoagulants: Secondary | ICD-10-CM

## 2017-11-18 DIAGNOSIS — E119 Type 2 diabetes mellitus without complications: Secondary | ICD-10-CM

## 2017-11-18 DIAGNOSIS — G40909 Epilepsy, unspecified, not intractable, without status epilepticus: Secondary | ICD-10-CM | POA: Diagnosis not present

## 2017-11-18 DIAGNOSIS — Z91041 Radiographic dye allergy status: Secondary | ICD-10-CM

## 2017-11-18 DIAGNOSIS — Z8249 Family history of ischemic heart disease and other diseases of the circulatory system: Secondary | ICD-10-CM

## 2017-11-18 DIAGNOSIS — Z9181 History of falling: Secondary | ICD-10-CM

## 2017-11-18 LAB — COMPREHENSIVE METABOLIC PANEL
ALBUMIN: 2.3 g/dL — AB (ref 3.5–5.0)
ALT: 19 U/L (ref 14–54)
AST: 34 U/L (ref 15–41)
Alkaline Phosphatase: 114 U/L (ref 38–126)
Anion gap: 8 (ref 5–15)
BUN: 15 mg/dL (ref 6–20)
CHLORIDE: 113 mmol/L — AB (ref 101–111)
CO2: 18 mmol/L — ABNORMAL LOW (ref 22–32)
CREATININE: 1.2 mg/dL — AB (ref 0.44–1.00)
Calcium: 7.9 mg/dL — ABNORMAL LOW (ref 8.9–10.3)
GFR calc Af Amer: 56 mL/min — ABNORMAL LOW (ref >60)
GFR, EST NON AFRICAN AMERICAN: 48 mL/min — AB (ref >60)
GLUCOSE: 122 mg/dL — AB (ref 65–99)
POTASSIUM: 3.3 mmol/L — AB (ref 3.5–5.1)
Sodium: 139 mmol/L (ref 135–145)
Total Bilirubin: 0.4 mg/dL (ref 0.3–1.2)
Total Protein: 5.3 g/dL — ABNORMAL LOW (ref 6.5–8.1)

## 2017-11-18 LAB — PROTIME-INR
INR: 1.35
PROTHROMBIN TIME: 16.6 s — AB (ref 11.4–15.2)

## 2017-11-18 LAB — URINALYSIS, ROUTINE W REFLEX MICROSCOPIC
BILIRUBIN URINE: NEGATIVE
GLUCOSE, UA: NEGATIVE mg/dL
HGB URINE DIPSTICK: NEGATIVE
KETONES UR: NEGATIVE mg/dL
Leukocytes, UA: NEGATIVE
Nitrite: NEGATIVE
PROTEIN: NEGATIVE mg/dL
Specific Gravity, Urine: 1.015 (ref 1.005–1.030)
pH: 6 (ref 5.0–8.0)

## 2017-11-18 LAB — CBC WITH DIFFERENTIAL/PLATELET
Basophils Absolute: 0 10*3/uL (ref 0.0–0.1)
Basophils Relative: 0 %
EOS ABS: 0.2 10*3/uL (ref 0.0–0.7)
EOS PCT: 1 %
HCT: 27.3 % — ABNORMAL LOW (ref 36.0–46.0)
Hemoglobin: 8.8 g/dL — ABNORMAL LOW (ref 12.0–15.0)
LYMPHS ABS: 3.1 10*3/uL (ref 0.7–4.0)
LYMPHS PCT: 25 %
MCH: 24.2 pg — AB (ref 26.0–34.0)
MCHC: 32.2 g/dL (ref 30.0–36.0)
MCV: 75 fL — AB (ref 78.0–100.0)
MONO ABS: 1.7 10*3/uL — AB (ref 0.1–1.0)
MONOS PCT: 13 %
Neutro Abs: 7.7 10*3/uL (ref 1.7–7.7)
Neutrophils Relative %: 61 %
PLATELETS: 157 10*3/uL (ref 150–400)
RBC: 3.64 MIL/uL — ABNORMAL LOW (ref 3.87–5.11)
RDW: 19.4 % — AB (ref 11.5–15.5)
WBC: 12.7 10*3/uL — AB (ref 4.0–10.5)

## 2017-11-18 LAB — TYPE AND SCREEN
ABO/RH(D): O POS
Antibody Screen: NEGATIVE

## 2017-11-18 LAB — I-STAT CG4 LACTIC ACID, ED
LACTIC ACID, VENOUS: 2.07 mmol/L — AB (ref 0.5–1.9)
LACTIC ACID, VENOUS: 1.39 mmol/L (ref 0.5–1.9)

## 2017-11-18 LAB — BRAIN NATRIURETIC PEPTIDE: B Natriuretic Peptide: 128.9 pg/mL — ABNORMAL HIGH (ref 0.0–100.0)

## 2017-11-18 MED ORDER — POTASSIUM CHLORIDE CRYS ER 20 MEQ PO TBCR
20.0000 meq | EXTENDED_RELEASE_TABLET | Freq: Two times a day (BID) | ORAL | Status: DC
  Administered 2017-11-19 – 2017-11-21 (×7): 20 meq via ORAL
  Filled 2017-11-18 (×7): qty 1

## 2017-11-18 MED ORDER — MORPHINE SULFATE (PF) 4 MG/ML IV SOLN
2.0000 mg | Freq: Once | INTRAVENOUS | Status: AC
  Administered 2017-11-18: 2 mg via INTRAVENOUS
  Filled 2017-11-18: qty 1

## 2017-11-18 MED ORDER — SODIUM CHLORIDE 0.9 % IV BOLUS (SEPSIS)
500.0000 mL | Freq: Once | INTRAVENOUS | Status: AC
  Administered 2017-11-18: 500 mL via INTRAVENOUS

## 2017-11-18 MED ORDER — VANCOMYCIN HCL IN DEXTROSE 1-5 GM/200ML-% IV SOLN
1000.0000 mg | Freq: Once | INTRAVENOUS | Status: AC
  Administered 2017-11-18: 1000 mg via INTRAVENOUS
  Filled 2017-11-18: qty 200

## 2017-11-18 MED ORDER — SODIUM CHLORIDE 0.9 % IV BOLUS (SEPSIS)
1000.0000 mL | Freq: Once | INTRAVENOUS | Status: AC
  Administered 2017-11-18: 1000 mL via INTRAVENOUS

## 2017-11-18 MED ORDER — PIPERACILLIN-TAZOBACTAM 3.375 G IVPB 30 MIN
3.3750 g | Freq: Once | INTRAVENOUS | Status: AC
  Administered 2017-11-18: 3.375 g via INTRAVENOUS
  Filled 2017-11-18: qty 50

## 2017-11-18 MED ORDER — FENTANYL CITRATE (PF) 100 MCG/2ML IJ SOLN: 50.0000 ug | INTRAMUSCULAR | Status: DC | PRN

## 2017-11-18 NOTE — ED Provider Notes (Signed)
[facility]   CSN: [id number] Arrival date & time: 11/18/17  1711     History   Chief Complaint Chief Complaint  Patient presents with  . Hypotension    HPI Amber [name] is a 61 y.o. female.  HPI  Patient is a chronically ill 61 year old female, she has a history of embolic stroke, seizures, diabetes, frequent falls and a history of hypertension.  According to the medical record the patient had recently been admitted to the hospital after having rib pain after a fall, chronic pain and chronic anemia.  She was also noted to be nearly hypotensive during the admission  The patient presents today after stating from family members that she has become progressively more altered today.  The patient reportedly had a fall yesterday, she is able to tell me this, she states she was in the bathroom and fell when she was trying to sit on the bathroom toilet.  She struck her right buttocks, ribs and right shoulder.  She reports that she had difficulty getting up and was on the ground for about an hour.  On paramedic arrival today they found the patient to be confused and slow to come around, she was also febrile to 100.8, hypotensive to 88 systolic and seemingly somnolent but arousable.  Family members report that she has had progressive worsening of her condition today.  The patient is not able to give me much history in the way of confusion or fevers, she is able to tell me that she fell yesterday and that is the extent of her history.  Level 5 caveat applies secondary to altered mental status  Past Medical History:  Diagnosis Date  . Arthritis   . Asthma   . Chronic pain   . Constipation   . Diabetes mellitus without complication (HCC)   . Embolic stroke (HCC)   . Falls frequently   . GERD (gastroesophageal reflux disease)   . Hypertension   . Pneumonia 10/2015  . Seizures [facility](HCC)    last seizure March 2015    Patient Active  Problem List   Diagnosis Date Noted  . Hypotension 11/18/2017  . Fall   . Rib pain 10/20/2017  . PFO (patent foramen ovale) 02/15/2017  . Impacted fracture 02/15/2017  . History of CVA with residual deficit 02/15/2017  . Drug-induced hyperglycemia 02/15/2017  . Altered mental status   . Chest pain 11/23/2016  . CVA (cerebral vascular accident) (HCC) 10/28/2016  . Tachycardia   . Seizure (HCC) 10/26/2016  . Embolic stroke (HCC) 10/26/2016  . New onset atrial fibrillation (HCC) 10/26/2016  . 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 Focal seizure (HCC)   . Shoulder pain, right   . Duodenitis   . Abdominal pain 11/07/2015  . Constipation 11/07/2015  . Abdominal distension   . Chest pain, musculoskeletal 08/24/2015  . Diabetes mellitus type 2, controlled (HCC) 07/27/2015  . Chronic back pain   . Frequent falls   . Essential hypertension   . Pulmonary hypertension (HCC)   . Gout   . Asthma 06/12/2015  . ACS (acute coronary syndrome) (HCC) 06/12/2015  . Microcytic anemia 01/26/2015  . History of seizures 01/26/2015    Past Surgical History:  Procedure Laterality Date  . APPENDECTOMY    . BACK SURGERY    . CHOLECYSTECTOMY    . TEE WITHOUT CARDIOVERSION N/A 10/30/2016   Procedure: TRANSESOPHAGEAL ECHOCARDIOGRAM (TEE);  Surgeon: Lars MassonKatarina H Nelson, MD;  [facility];  Service: Cardiovascular;  Laterality: N/A;    OB History    No data available       Home Medications    Prior to Admission medications   Medication Sig Start Date End Date Taking? Authorizing Provider  albuterol (PROVENTIL HFA;VENTOLIN HFA) 108 (90 BASE) MCG/ACT inhaler Inhale 2 puffs into the lungs every 6 (six) hours as needed for wheezing or shortness of breath.    [provider]  allopurinol (ZYLOPRIM) 300 MG tablet Take 300 mg by mouth daily. 11/11/16   [provider]  aspirin EC 325 MG EC tablet Take 1 tablet (325 mg total) by mouth daily.  10/31/16   Richarda Overlie, MD  atorvastatin (LIPITOR) 10 MG tablet Take 10 mg by mouth daily. 08/13/15   [provider]  ELIQUIS 5 MG TABS tablet Take 5 mg by mouth daily. 09/27/17   [provider]  feeding supplement, ENSURE ENLIVE, (ENSURE ENLIVE) LIQD Take 237 mLs by mouth 2 (two) times daily between meals. Patient taking differently: Take 237 mLs by mouth 2 (two) times daily between meals. strawberry 11/10/15   Catarina Hartshorn, MD  Fluticasone-Salmeterol (ADVAIR) 250-50 MCG/DOSE AEPB Inhale 1 puff into the lungs 2 (two) times daily.    [provider]  Fluticasone-Salmeterol (ADVAIR) 250-50 MCG/DOSE AEPB Inhale 2 puffs into the lungs as needed.    [provider]  folic acid (FOLVITE) 1 MG tablet Take 1 mg by mouth daily.    [provider]  gabapentin (NEURONTIN) 400 MG capsule Take 2 capsules (800 mg total) by mouth 3 (three) times daily. 10/23/17   Lanelle Bal, MD  lacosamide 100 MG TABS Take 1 tablet (100 mg total) by mouth 2 (two) times daily. 10/31/16   Richarda Overlie, MD  lactulose (CHRONULAC) 10 GM/15ML solution Take 30 mLs (20 g total) by mouth 2 (two) times daily as needed for severe constipation. 08/22/16   Jacalyn Lefevre, MD  levETIRAcetam (KEPPRA) 500 MG tablet Take 500 mg by mouth 2 (two) times daily. 07/16/17   [provider]  lidocaine (LIDODERM) 5 % Place 1 patch onto the skin daily. Remove & Discard patch within 12 hours or as directed by MD 10/23/17   Lanelle Bal, MD  loperamide (IMODIUM) 2 MG capsule Take 1 capsule (2 mg total) by mouth 4 (four) times daily as needed for diarrhea or loose stools. 04/16/17   Alvira Monday, MD  metFORMIN (GLUCOPHAGE-XR) 500 MG 24 hr tablet Take 500 mg by mouth daily. 11/11/16   [provider]  metoprolol succinate (TOPROL-XL) 50 MG 24 hr tablet Take 1 tablet (50 mg total) by mouth daily. Patient taking differently: Take 50 mg by mouth 2 (two) times daily.  11/25/16 10/21/17   Richarda Overlie, MD  oxyCODONE (OXY IR/ROXICODONE) 5 MG immediate release tablet Take 20 mg by mouth 4 (four) times daily as needed for severe pain.     [provider]  potassium chloride 20 MEQ TBCR Take 40 mEq by mouth daily. Patient taking differently: Take 20 mEq by mouth 2 (two) times daily.  11/25/16 10/21/17  Richarda Overlie, MD  predniSONE (DELTASONE) 10 MG tablet Take 15 mg by mouth daily with breakfast.    [provider]  torsemide (DEMADEX) 10 MG tablet Take 10 mg by mouth daily.    [provider]  triamcinolone (KENALOG) 0.025 % cream Apply 1 application topically as needed. 08/21/17   [provider]  valproic acid (DEPAKENE) 250 MG capsule Take 500 mg by  mouth daily. 07/16/17   [provider]  Vitamin D, Ergocalciferol, (DRISDOL) 50000 UNITS CAPS capsule Take 1 capsule (50,000 Units total) by mouth every 7 (seven) days. Sunday Patient taking differently: Take 50,000 Units by mouth every 7 (seven) days. Monday 01/27/15   Calvert Cantor, MD    Family History Family History  Problem Relation Age of Onset  . Kidney failure Mother   . Hypertension Sister   . Hypertension Brother     Social History Social History   Tobacco Use  . Smoking status: Former Smoker    Last attempt to quit: 2010    Years since quitting: 9.0  . Smokeless tobacco: Never Used  Substance Use Topics  . Alcohol use: No    Comment: admits to 2 drinks/week  . Drug use: No     Allergies   Ivp dye [iodinated diagnostic agents] and Metrizamide   Review of Systems Review of Systems  Unable to perform ROS: Mental status change     Physical Exam Updated Vital Signs BP 109/79   Pulse (!) 101   Temp 98.8 F (37.1 C) (Rectal)   Resp 14   SpO2 99%   Physical Exam  Constitutional: She appears well-developed and well-nourished. She appears distressed.  HENT:  Head: Normocephalic and atraumatic.  Mouth/Throat: Oropharynx is clear and moist. No oropharyngeal  exudate.  No signs of trauma about the head including hematoma, bruising, laceration, abrasion, malocclusion, raccoon eyes, battle sign.  Oropharynx is clear and moist, edentulous  Eyes: Conjunctivae and EOM are normal. Pupils are equal, round, and reactive to light. Right eye exhibits no discharge. Left eye exhibits no discharge. No scleral icterus.  Neck: Normal range of motion. Neck supple. No JVD present. No thyromegaly present.  Cardiovascular: Normal rate, regular rhythm, normal heart sounds and intact distal pulses. Exam reveals no gallop and no friction rub.  No murmur heard. Pulmonary/Chest: Effort normal. No respiratory distress. She has no wheezes. She has rales ( The patient has rales at the bases right greater than left, she does not have increased work of breathing but does seem to grown and take shallow breaths).  Abdominal: Soft. Bowel sounds are normal. She exhibits no distension and no mass. There is tenderness ( Mild diffuse tenderness, no guarding, no peritoneal signs, no tympanitic sounds to percussion).  Musculoskeletal: She exhibits edema ( There is 2-3+ pitting edema bilateral of the lower extremities, it is symmetrical) and tenderness ( She has tenderness with palpation over the right shoulder and with range of motion of the shoulder, tenderness over the right ribs posteriorly and tenderness over the bilateral hips with range of motion though no tenderness at rest and no leg length discr).  Lymphadenopathy:    She has no cervical adenopathy.  Neurological: She is alert. Coordination normal.  The patient is awake, she is groaning through the entire exam, she speaks very little but is able to answer simple questions and follow simple commands  Skin: Skin is warm and dry. No rash noted. No erythema.  Psychiatric: She has a normal mood and affect. Her behavior is normal.  Nursing note and vitals reviewed.    ED Treatments / Results  Labs (all labs ordered are listed, but  only abnormal results are displayed) Labs Reviewed  COMPREHENSIVE METABOLIC PANEL - Abnormal; Notable for the following components:      Result Value   Potassium 3.3 (*)    Chloride 113 (*)    CO2 18 (*)    Glucose, Bld  122 (*)    Creatinine, Ser 1.20 (*)    Calcium 7.9 (*)    Total Protein 5.3 (*)    Albumin 2.3 (*)    GFR calc non Af Amer 48 (*)    GFR calc Af Amer 56 (*)    All other components within normal limits  CBC WITH DIFFERENTIAL/PLATELET - Abnormal; Notable for the following components:   WBC 12.7 (*)    RBC 3.64 (*)    Hemoglobin 8.8 (*)    HCT 27.3 (*)    MCV 75.0 (*)    MCH 24.2 (*)    RDW 19.4 (*)    Monocytes Absolute 1.7 (*)    All other components within normal limits  PROTIME-INR - Abnormal; Notable for the following components:   Prothrombin Time 16.6 (*)    All other components within normal limits  I-STAT CG4 LACTIC ACID, ED - Abnormal; Notable for the following components:   Lactic Acid, Venous 2.07 (*)    All other components within normal limits  CULTURE, BLOOD (ROUTINE X 2)  CULTURE, BLOOD (ROUTINE X 2)  URINE CULTURE  URINALYSIS, ROUTINE W REFLEX MICROSCOPIC  BRAIN NATRIURETIC PEPTIDE  I-STAT CG4 LACTIC ACID, ED  TYPE AND SCREEN    EKG  EKG Interpretation  Date/Time:  Sunday November 18 2017 17:26:22 EST Ventricular Rate:  95 PR Interval:    QRS Duration: 96 QT Interval:  366 QTC Calculation: 463 R Axis:   70 Text Interpretation:  Sinus rhythm Borderline low voltage, extremity leads Anteroseptal infarct, old Minimal ST depression since last tracing no significant change Confirmed by Eber Hong ([id number]) on 11/18/2017 5:32:06 PM       Radiology Dg Ribs Unilateral W/chest Right  Result Date: 11/18/2017 CLINICAL DATA:  Larey Seat yesterday.  Bilateral rib pain. EXAM: RIGHT RIBS AND CHEST - 3+ VIEW COMPARISON:  Chest x-ray 10/20/2017 and chest CT, same date. FINDINGS: The cardiac silhouette, mediastinal and hilar contours are stable. No  acute pulmonary findings. There are numerous remote bilateral rib fractures without definite new/acute fractures. No pneumothorax. IMPRESSION: Remote healed bilateral rib fractures without definite acute fractures. No acute cardiopulmonary findings. Electronically Signed   By: Rudie Meyer M.D.   On: 11/18/2017 18:44   Dg Pelvis 1-2 Views  Result Date: 11/18/2017 CLINICAL DATA:  Larey Seat.  Bilateral hip pain. EXAM: PELVIS - 1-2 VIEW COMPARISON:  CT scan 10/20/2017 FINDINGS: Both hips are normally located. Mild degenerative changes bilaterally. No acute fracture or plain film evidence of AVN. The pubic symphysis and SI joints are intact. No definite pelvic fractures. Extensive vascular calcifications. IMPRESSION: No acute bony findings. Electronically Signed   By: Rudie Meyer M.D.   On: 11/18/2017 18:48   Dg Shoulder Right  Result Date: 11/18/2017 CLINICAL DATA:  Larey Seat.  Right shoulder pain. EXAM: RIGHT SHOULDER - 2+ VIEW COMPARISON:  Chest x-ray 10/20/2017 FINDINGS: Chronic severe degenerative changes involving the right shoulder. No definite acute fracture. Numerous remote healed right rib fractures are noted. No definite acute fractures. [facility] joint is degenerated but stable. IMPRESSION: No definite fracture or dislocation. Stable severe degenerative changes. Electronically Signed   By: Rudie Meyer M.D.   On: 11/18/2017 18:47    Procedures Procedures (including critical care time)  Medications Ordered in ED Medications  fentaNYL (SUBLIMAZE) injection 50 mcg (not administered)  sodium chloride 0.9 % bolus 1,000 mL (1,000 mLs Intravenous New Bag/Given 11/18/17 1842)    And  sodium chloride 0.9 % bolus 500 mL (500 mLs Intravenous  New Bag/Given 11/18/17 1842)  piperacillin-tazobactam (ZOSYN) IVPB 3.375 g (0 g Intravenous Stopped 11/18/17 2018)  vancomycin (VANCOCIN) IVPB 1000 mg/200 mL premix (0 mg Intravenous Stopped 11/18/17 2018)  morphine 4 MG/ML injection 2 mg (2 mg Intravenous Given  11/18/17 1936)     Initial Impression / Assessment and Plan / ED Course  I have reviewed the triage vital signs and the nursing notes.  Pertinent labs & imaging results that were available during my care of the patient were reviewed by me and considered in my medical decision making (see chart for details).     Because of the patient's symptoms is unclear though with her fever and hypotension I am concerned for a case of sepsis.  As she does have some rales this could indicate that she has an underlying pneumonia however with peripheral edema I would also consider congestive heart failure.  Labs have been ordered, x-rays ordered to rule out trauma to the pelvis the ribs or the shoulder and to further evaluate the lungs.  Her EKG shows normal sinus rhythm, there are some slight T wave abnormalities but no ST elevations or depressions.  The patient will have a rectal temperature, IV fluids been ordered, we will treat this as a case of sepsis until proven otherwise  The patient has no acute findings on the x-ray as well as the urinalysis, her lactic acid was initially above 2 but with IV fluids it has improved.  She is mildly hypokalemic, her creatinine is 1.2 and her white blood cell count was elevated at 12.7.  She has received IV fluids at 30 cc/kg as well as antibiotics including vancomycin and Zosyn and her blood pressure is currently better.  She is still complaining of pain in several different areas including her right ribs shoulder and her bilateral thighs, her x-rays have been negative for fracture of the pelvis as well.  She has been able to move all 4 extremities and is still able to move both of her legs, they do not appear weak.  I discussed the care with the internal medicine resident who will admit the patient to the hospital.  This could be occult sepsis  Final Clinical Impressions(s) / ED Diagnoses   Final diagnoses:  SIRS (systemic inflammatory response syndrome) (HCC)  Pain in  both lower extremities      Eber Hong, MD 11/18/17 2107

## 2017-11-18 NOTE — H&P (Signed)
Date: 11/18/2017               Patient Name:  Elyce Zollinger MRN: [id number]  DOB: 04-11-1956 Age / Sex: 61 y.o., female   PCP: Sandi Mariscal, MD         Medical Service: Internal Medicine Teaching Service         Attending Physician: Dr. Annia Belt, MD    First Contact: Dr. Lurene Shadow Pager: [phone]  Second Contact: Dr. Danford Bad Pager: [phone]       After Hours (After 5p/  First Contact Pager: [phone]  weekends / holidays): Second Contact Pager: [phone]   Chief Complaint: hypotension and increased somnolence   History of Present Illness: Deonne Rooks is a 61 year old female who presents with hypotension after EMS was called to her home for concerns of increasing somnolence as per family.  She has extensive past medical history significant for seizure just disorder, embolic stroke, diabetes mellitus type 2, chronic pain, asthma, hypertension, and arthritis.  The patient stated that she had fallen the day prior in her bathroom striking her right shoulder, right side of her chest, and right buttocks again.  She difficulty rising at that time and was down for approximately 1 hour.  Upon arrival to her home, EMS noted that the patient was febrile to 100.8, hypotensive to 88 systolic, and was somnolent but arousable. (Portion of the HPI was obtained via records as the patient is unclear as to the events leading to her presentation.)  In addition the patient attested to the inability to urinate which is not new, but possibly acutely worse in the past 7-8 hours.  The patient was concerned for significantly worsening right-sided groin and leg pain that radiates to her toes.  She has significant pain from mid abdomen distal to her feet which is only mildly improved with her home dose of oxycodone 20 mg 4 times daily.  She states that she continues to take her of gabapentin 800 mg 3 times daily as directed, but is no longer taking cyclobenzaprine due to concerns for sedation.  The patient states  that home physical therapy has not yet established with her for reasons she is unclear of.  She was told that they will begin in the new year.  The patient denied headaches, visual changes, nausea, vomiting, chest pain, palpitations, arm pain, dysuria, hematuria, hematochezia, constipation, diarrhea, or fever.  She attested to the inability to empty her bladder most recently as of today, worsening groin pain, mental confusion as of today, but no other significant concerning symptoms.  In the ED, DG of the chest, ribs, right shoulder, and pelvis were unremarkable for acute process.  EKG was notably inconsistent with her previous study on 10/20/2017, as indicated sinus tach, possible atrial enlargement, QTC prolongation of 506, multiple nonspecific abnormalities.  CBC with differential indicated a leukocytosis of 12.7, H&H of 8.8 and was 7.3, with stable platelets at 150 7K.  She continues to have a NCV less than 78.  BNP was mildly elevated at 128.  CMP indicated potassium of 3.3, chloride of 113, creatinine of 1.20, with a GFR of 56 but otherwise unremarkable AST, ALT, and alk phos.  Meds:  Current Meds  Medication Sig  . apixaban (ELIQUIS) 5 MG TABS tablet Take 5 mg by mouth 2 (two) times daily.  . metFORMIN (GLUCOPHAGE-XR) 500 MG 24 hr tablet Take 500 mg by mouth daily.  . metoprolol succinate (TOPROL-XL) 25 MG 24 hr tablet Take 25 mg by  mouth daily.  Marland Kitchen oxyCODONE (OXYCONTIN) 20 mg 12 hr tablet Take 20 mg by mouth 2 (two) times daily.  . Oxycodone HCl 20 MG TABS Take 1 tablet by mouth 4 (four) times daily.  . predniSONE (DELTASONE) 5 MG tablet Take 5 mg by mouth daily.  Marland Kitchen triamcinolone (KENALOG) 0.025 % cream Apply 1 application topically daily as needed (rash/itching).   . valproic acid (DEPAKENE) 250 MG capsule Take 500 mg by mouth every 12 (twelve) hours.    Allergies: Allergies as of 11/18/2017 - Review Complete 11/18/2017  Allergen Reaction Noted  . Ivp dye [iodinated diagnostic agents]  Itching 05/29/2014  . Metrizamide Itching 05/29/2014   Past Medical History:  Diagnosis Date  . Arthritis   . Asthma   . Chronic pain   . Constipation   . Diabetes mellitus without complication ([place])   . Embolic stroke ([place])   . Falls frequently   . GERD (gastroesophageal reflux disease)   . Hypertension   . Pneumonia 10/2015  . Seizures ([place])    last seizure March 2015   Family History:  Family History  Problem Relation Age of Onset  . Kidney failure Mother   . Hypertension Sister   . Hypertension Brother    Social History:  Social History   Tobacco Use  . Smoking status: Former Smoker    Last attempt to quit: 2010    Years since quitting: 9.0  . Smokeless tobacco: Never Used  Substance Use Topics  . Alcohol use: No    Comment: admits to 2 drinks/week  . Drug use: No    Review of Systems: A complete ROS was negative except as per HPI.   Physical Exam: Blood pressure 110/80, pulse 98, temperature 98.8 F (37.1 C), temperature source Rectal, resp. rate 15, SpO2 100 %. Physical Exam  Constitutional: She is oriented to person, place, and time. She appears well-developed and well-nourished. No distress.  HENT:  Head: Normocephalic and atraumatic.  Eyes: Conjunctivae and EOM are normal. Pupils are equal, round, and reactive to light.  Neck: Normal range of motion. Neck supple.  Cardiovascular: Normal rate, regular rhythm and intact distal pulses.  No murmur heard. Pulmonary/Chest: Effort normal and breath sounds normal. No respiratory distress.  Abdominal: Soft. Bowel sounds are normal. There is no tenderness.  Musculoskeletal: She exhibits edema (+1 bilaterally to the knees) and tenderness (To palpation of the right groin area more acutely than prevoiusly ).  Neurological: She is alert and oriented to person, place, and time.  Patient is unwilling/unable to lift her legs due to pain thereby prohibiting a thorough evaluation of her neurological status with regard  to strength or reflexes in the lower extremities. As best can be ascertained she is demonstrating 2/5 strength bilaterally which is possibly worse on the right.  Bilateral UE strength 5/5  Skin: Skin is warm. Capillary refill takes less than 2 seconds. She is not diaphoretic.  Psychiatric: She has a normal mood and affect.  Nursing note and vitals reviewed.  EKG: personally reviewed my interpretation is that there is QTc prolongation, sinus tachycardia, possible atrial enlargement, no evidence of ST depression/elevation.   CXR: personally reviewed my interpretation is no evidence of rib fractures or acute cardiopulmonary abnormalities when compared to previous studies from 10/20/2017.  DG Pelvis: failed to demonstrate acute abnormality.  DG Right Shoulder:  No acute fracture noted.   Assessment & Plan by Problem: Active Problems:   Microcytic anemia   Asthma   Diabetes mellitus type 2,  controlled ([place])   Chronic back pain   Essential hypertension   Seizure ([place])   Hypotension  Delberta Folts, is a 61 year old female who presents with hypotension, somnolence and increased lower extremity pain.  Patient has a history of extensive lower extremity pain for which she takes oxycodone, gabapentin, cyclamens brain, and has utilized lidocaine patches for in the past.  This pain is mildly worse for the past 1 week.  She presents today via EMS due to increased somnolence, having fallen and found on the floor by family, worsened lower extremity pain, fever of 100.8, and hypotension.  The source of her fever, hypotension, leukocytosis, and mild lactic acidosis is unclear at this time but is thought to be most likely secondary to her fall and underlying chronic medical conditions in conjunction with polypharmacy.  However, given his positive SIRS criteria without a clear identifiable source for infection concern for sepsis is noted and as such the patient will be monitored closely.  Hypotension: Noted by  EMS, Has improved since that time. Had a fever of 100.8 as per EMS. Afebrile since that time.  --We will continue the fluids at 165m/hr at this time and reassess in the am --Continue to hold antihypertensives until BP improves --Concern for sepsis initially, will continue to monitor  --Given initial fever, mild lactic acidosis of 2.07, WBC of 12.7 and hypotension she was given vanc and zosyn --Cultures taken of her urine and blood are pending --Will consider continued need for antibiotics upon reassessment as she is responded very well to initial fluid rehydration with 3 minutes improvement in her mental status.  Seizure disorder: Patient has a documented history of seizure disorders for which she takes Keppra, lacosamide.  The patient adamantly attested taking his medications as such that will be continued. -Continued lacosamide 100 mg twice daily -Continued levetiracetam 500 mg twice daily  Chronic back pain: Continue patient's oxycodone 20 mg 4 times daily for chronic back pain.  This pain was extensively evaluated on her previous admission on December 1 - December 4 of this year via MRI, CT, and x-ray.  No acute changes were noted nor neurosurgical evaluation indicated. -Continue gabapentin 800 mg 3 times daily  Fall: The patient has a history of extensive falls in the past most likely secondary to polypharmacy and lower extremity weakness.  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  The patient is able to ambulate with minimal assistance at baseline. --During a previous admission x-rays of the lumbar and thoracic spine were performed which did not indicate acute process.  MRI of the lumbar and thoracic spine was also unremarkable at that time for acute process. --The  patient will need to consider following with neurosurgery if her condition worsens.  Chronic hypokalemia: The patient's potassium was 3.3 on admission.  This is a chronic condition as she is on home potassium 20 mEq twice daily. -Repeat BMP in a.m. - Replace potassium with oral 20 mEq twice daily  DMII: --SSI moderate TID plus QH correction.  Chronic microcytic anemia: Patient's hemoglobin is 8.8 and this admission which is at her baseline.  Patient is asymptomatic with regard to her anemia.   --Plan to continue to monitor this with CBC in a.m.  Essential Hypertension: History of HTN. Held antihypertensives, metoprolol and torsemide due to low BP --Will  restart if BP increases as indicated  Diet: Carb Modified  Code: Full code Fluids: given 1.5 liters in the ED, continue 173m/hr overnight Dvt ppx:  Dispo: Admit patient to Observation with expected length of stay less than 2 midnights.  Signed: HKathi Ludwig MD 11/18/2017, 10:40 PM  Pager: Pager# [phone]

## 2017-11-18 NOTE — ED Notes (Signed)
Dr. Hyacinth Meeker given results of I stat lactic acid by B. Bing Plume, EMT

## 2017-11-18 NOTE — ED Notes (Signed)
Patient transported to X-ray 

## 2017-11-18 NOTE — ED Triage Notes (Signed)
Per EMS, pt from home with c/o increased lethargy and shaking. CBG 169, pt initially hypotensive at 88/50, given 400ccNS PTA, repeat BP 99/69. Pt c/o back pain following a fall yesterday, hx CVA with right sided deficits, pt A&O x 4, extremity swelling noted.

## 2017-11-18 NOTE — ED Notes (Signed)
Called xray to check on status of patient. Patient now on table for xrays. Encouraged xray to return patient to room as soon as finished.

## 2017-11-19 ENCOUNTER — Inpatient Hospital Stay (HOSPITAL_COMMUNITY): Payer: Medicaid Other

## 2017-11-19 DIAGNOSIS — Z87891 Personal history of nicotine dependence: Secondary | ICD-10-CM | POA: Diagnosis not present

## 2017-11-19 DIAGNOSIS — Z7951 Long term (current) use of inhaled steroids: Secondary | ICD-10-CM | POA: Diagnosis not present

## 2017-11-19 DIAGNOSIS — K56600 Partial intestinal obstruction, unspecified as to cause: Secondary | ICD-10-CM | POA: Diagnosis not present

## 2017-11-19 DIAGNOSIS — R410 Disorientation, unspecified: Secondary | ICD-10-CM | POA: Diagnosis present

## 2017-11-19 DIAGNOSIS — I361 Nonrheumatic tricuspid (valve) insufficiency: Secondary | ICD-10-CM | POA: Diagnosis not present

## 2017-11-19 DIAGNOSIS — I10 Essential (primary) hypertension: Secondary | ICD-10-CM | POA: Diagnosis present

## 2017-11-19 DIAGNOSIS — M549 Dorsalgia, unspecified: Secondary | ICD-10-CM | POA: Diagnosis not present

## 2017-11-19 DIAGNOSIS — D649 Anemia, unspecified: Secondary | ICD-10-CM | POA: Diagnosis not present

## 2017-11-19 DIAGNOSIS — R1110 Vomiting, unspecified: Secondary | ICD-10-CM | POA: Diagnosis not present

## 2017-11-19 DIAGNOSIS — Z7982 Long term (current) use of aspirin: Secondary | ICD-10-CM | POA: Diagnosis not present

## 2017-11-19 DIAGNOSIS — E119 Type 2 diabetes mellitus without complications: Secondary | ICD-10-CM | POA: Diagnosis not present

## 2017-11-19 DIAGNOSIS — D6489 Other specified anemias: Secondary | ICD-10-CM | POA: Diagnosis not present

## 2017-11-19 DIAGNOSIS — Z9181 History of falling: Secondary | ICD-10-CM | POA: Diagnosis not present

## 2017-11-19 DIAGNOSIS — R0989 Other specified symptoms and signs involving the circulatory and respiratory systems: Secondary | ICD-10-CM | POA: Diagnosis not present

## 2017-11-19 DIAGNOSIS — M62838 Other muscle spasm: Secondary | ICD-10-CM | POA: Diagnosis present

## 2017-11-19 DIAGNOSIS — Z7952 Long term (current) use of systemic steroids: Secondary | ICD-10-CM | POA: Diagnosis not present

## 2017-11-19 DIAGNOSIS — I950 Idiopathic hypotension: Secondary | ICD-10-CM | POA: Diagnosis present

## 2017-11-19 DIAGNOSIS — R6510 Systemic inflammatory response syndrome (SIRS) of non-infectious origin without acute organ dysfunction: Secondary | ICD-10-CM | POA: Diagnosis present

## 2017-11-19 DIAGNOSIS — Z981 Arthrodesis status: Secondary | ICD-10-CM | POA: Diagnosis not present

## 2017-11-19 DIAGNOSIS — G8929 Other chronic pain: Secondary | ICD-10-CM | POA: Diagnosis not present

## 2017-11-19 DIAGNOSIS — Z7901 Long term (current) use of anticoagulants: Secondary | ICD-10-CM | POA: Diagnosis not present

## 2017-11-19 DIAGNOSIS — I959 Hypotension, unspecified: Secondary | ICD-10-CM | POA: Diagnosis not present

## 2017-11-19 DIAGNOSIS — M7989 Other specified soft tissue disorders: Secondary | ICD-10-CM | POA: Diagnosis not present

## 2017-11-19 DIAGNOSIS — D72829 Elevated white blood cell count, unspecified: Secondary | ICD-10-CM | POA: Diagnosis not present

## 2017-11-19 DIAGNOSIS — D62 Acute posthemorrhagic anemia: Secondary | ICD-10-CM | POA: Diagnosis present

## 2017-11-19 DIAGNOSIS — G40909 Epilepsy, unspecified, not intractable, without status epilepticus: Secondary | ICD-10-CM | POA: Diagnosis present

## 2017-11-19 DIAGNOSIS — K219 Gastro-esophageal reflux disease without esophagitis: Secondary | ICD-10-CM | POA: Diagnosis present

## 2017-11-19 DIAGNOSIS — K5900 Constipation, unspecified: Secondary | ICD-10-CM | POA: Diagnosis not present

## 2017-11-19 DIAGNOSIS — D638 Anemia in other chronic diseases classified elsewhere: Secondary | ICD-10-CM | POA: Diagnosis present

## 2017-11-19 DIAGNOSIS — I4891 Unspecified atrial fibrillation: Secondary | ICD-10-CM | POA: Diagnosis present

## 2017-11-19 DIAGNOSIS — Z8673 Personal history of transient ischemic attack (TIA), and cerebral infarction without residual deficits: Secondary | ICD-10-CM | POA: Diagnosis not present

## 2017-11-19 DIAGNOSIS — M4680 Other specified inflammatory spondylopathies, site unspecified: Secondary | ICD-10-CM | POA: Diagnosis not present

## 2017-11-19 DIAGNOSIS — M4800 Spinal stenosis, site unspecified: Secondary | ICD-10-CM | POA: Diagnosis not present

## 2017-11-19 DIAGNOSIS — Z978 Presence of other specified devices: Secondary | ICD-10-CM | POA: Diagnosis not present

## 2017-11-19 DIAGNOSIS — E876 Hypokalemia: Secondary | ICD-10-CM | POA: Diagnosis present

## 2017-11-19 DIAGNOSIS — Z7984 Long term (current) use of oral hypoglycemic drugs: Secondary | ICD-10-CM | POA: Diagnosis not present

## 2017-11-19 DIAGNOSIS — Z8249 Family history of ischemic heart disease and other diseases of the circulatory system: Secondary | ICD-10-CM | POA: Diagnosis not present

## 2017-11-19 DIAGNOSIS — M069 Rheumatoid arthritis, unspecified: Secondary | ICD-10-CM | POA: Diagnosis present

## 2017-11-19 DIAGNOSIS — E872 Acidosis: Secondary | ICD-10-CM | POA: Diagnosis present

## 2017-11-19 DIAGNOSIS — Z79899 Other long term (current) drug therapy: Secondary | ICD-10-CM | POA: Diagnosis not present

## 2017-11-19 DIAGNOSIS — J45909 Unspecified asthma, uncomplicated: Secondary | ICD-10-CM | POA: Diagnosis present

## 2017-11-19 LAB — CBG MONITORING, ED: Glucose-Capillary: 71 mg/dL (ref 65–99)

## 2017-11-19 LAB — GLUCOSE, CAPILLARY
GLUCOSE-CAPILLARY: 137 mg/dL — AB (ref 65–99)
GLUCOSE-CAPILLARY: 177 mg/dL — AB (ref 65–99)
Glucose-Capillary: 118 mg/dL — ABNORMAL HIGH (ref 65–99)
Glucose-Capillary: 145 mg/dL — ABNORMAL HIGH (ref 65–99)

## 2017-11-19 LAB — RAPID URINE DRUG SCREEN, HOSP PERFORMED
Amphetamines: NOT DETECTED
Barbiturates: NOT DETECTED
Benzodiazepines: NOT DETECTED
COCAINE: NOT DETECTED
OPIATES: POSITIVE — AB
Tetrahydrocannabinol: NOT DETECTED

## 2017-11-19 LAB — ECHOCARDIOGRAM COMPLETE
Height: 60 in
Weight: 1982.38 oz

## 2017-11-19 LAB — CBC
HCT: 24.8 % — ABNORMAL LOW (ref 36.0–46.0)
Hemoglobin: 7.9 g/dL — ABNORMAL LOW (ref 12.0–15.0)
MCH: 24 pg — AB (ref 26.0–34.0)
MCHC: 31.9 g/dL (ref 30.0–36.0)
MCV: 75.4 fL — AB (ref 78.0–100.0)
PLATELETS: 151 10*3/uL (ref 150–400)
RBC: 3.29 MIL/uL — AB (ref 3.87–5.11)
RDW: 19.3 % — AB (ref 11.5–15.5)
WBC: 9.3 10*3/uL (ref 4.0–10.5)

## 2017-11-19 LAB — BASIC METABOLIC PANEL
Anion gap: 9 (ref 5–15)
BUN: 13 mg/dL (ref 6–20)
CALCIUM: 7.4 mg/dL — AB (ref 8.9–10.3)
CHLORIDE: 118 mmol/L — AB (ref 101–111)
CO2: 15 mmol/L — AB (ref 22–32)
CREATININE: 0.99 mg/dL (ref 0.44–1.00)
GFR calc non Af Amer: >60 mL/min (ref >60)
GLUCOSE: 128 mg/dL — AB (ref 65–99)
Potassium: 3.3 mmol/L — ABNORMAL LOW (ref 3.5–5.1)
Sodium: 142 mmol/L (ref 135–145)

## 2017-11-19 LAB — MRSA PCR SCREENING: MRSA BY PCR: POSITIVE — AB

## 2017-11-19 LAB — TSH: TSH: 1.091 u[IU]/mL (ref 0.350–4.500)

## 2017-11-19 LAB — HIV ANTIBODY (ROUTINE TESTING W REFLEX): HIV SCREEN 4TH GENERATION: NONREACTIVE

## 2017-11-19 MED ORDER — GABAPENTIN 400 MG PO CAPS
800.0000 mg | ORAL_CAPSULE | Freq: Three times a day (TID) | ORAL | Status: DC
  Administered 2017-11-19 – 2017-11-24 (×10): 800 mg via ORAL
  Filled 2017-11-19 (×10): qty 2

## 2017-11-19 MED ORDER — ALLOPURINOL 300 MG PO TABS
300.0000 mg | ORAL_TABLET | Freq: Every day | ORAL | Status: DC
  Administered 2017-11-19 – 2017-11-26 (×6): 300 mg via ORAL
  Filled 2017-11-19 (×6): qty 1

## 2017-11-19 MED ORDER — CHLORHEXIDINE GLUCONATE CLOTH 2 % EX PADS
6.0000 | MEDICATED_PAD | Freq: Every day | CUTANEOUS | Status: AC
  Administered 2017-11-20 – 2017-11-23 (×4): 6 via TOPICAL

## 2017-11-19 MED ORDER — DEXAMETHASONE SODIUM PHOSPHATE 4 MG/ML IJ SOLN
4.0000 mg | Freq: Once | INTRAMUSCULAR | Status: AC
  Administered 2017-11-19: 4 mg via INTRAVENOUS
  Filled 2017-11-19: qty 1

## 2017-11-19 MED ORDER — PREDNISONE 5 MG PO TABS
5.0000 mg | ORAL_TABLET | Freq: Every day | ORAL | Status: DC
  Administered 2017-11-19: 5 mg via ORAL
  Filled 2017-11-19: qty 1

## 2017-11-19 MED ORDER — FOLIC ACID 1 MG PO TABS
1.0000 mg | ORAL_TABLET | Freq: Every day | ORAL | Status: DC
  Administered 2017-11-19 – 2017-11-26 (×6): 1 mg via ORAL
  Filled 2017-11-19 (×6): qty 1

## 2017-11-19 MED ORDER — SODIUM CHLORIDE 0.9 % IV SOLN
INTRAVENOUS | Status: AC
  Administered 2017-11-19: 15:00:00 via INTRAVENOUS

## 2017-11-19 MED ORDER — LACTATED RINGERS IV BOLUS (SEPSIS)
1000.0000 mL | Freq: Once | INTRAVENOUS | Status: AC
  Administered 2017-11-19: 1000 mL via INTRAVENOUS

## 2017-11-19 MED ORDER — SODIUM CHLORIDE 0.9 % IV SOLN
INTRAVENOUS | Status: AC
  Administered 2017-11-19: via INTRAVENOUS

## 2017-11-19 MED ORDER — ENOXAPARIN SODIUM 30 MG/0.3ML ~~LOC~~ SOLN
30.0000 mg | SUBCUTANEOUS | Status: DC
  Filled 2017-11-19: qty 0.3

## 2017-11-19 MED ORDER — MOMETASONE FURO-FORMOTEROL FUM 200-5 MCG/ACT IN AERO
2.0000 | INHALATION_SPRAY | Freq: Two times a day (BID) | RESPIRATORY_TRACT | Status: DC
  Administered 2017-11-19 – 2017-11-25 (×14): 2 via RESPIRATORY_TRACT
  Filled 2017-11-19 (×3): qty 8.8

## 2017-11-19 MED ORDER — TIZANIDINE HCL 4 MG PO TABS
4.0000 mg | ORAL_TABLET | Freq: Four times a day (QID) | ORAL | Status: DC | PRN
  Administered 2017-11-19 – 2017-11-26 (×5): 4 mg via ORAL
  Filled 2017-11-19 (×7): qty 1

## 2017-11-19 MED ORDER — SODIUM CHLORIDE 0.9 % IV SOLN: INTRAVENOUS | Status: DC

## 2017-11-19 MED ORDER — VALPROIC ACID 250 MG PO CAPS
500.0000 mg | ORAL_CAPSULE | Freq: Two times a day (BID) | ORAL | Status: DC
  Administered 2017-11-19 – 2017-11-21 (×7): 500 mg via ORAL
  Filled 2017-11-19 (×8): qty 2

## 2017-11-19 MED ORDER — OXYCODONE HCL 5 MG PO TABS
20.0000 mg | ORAL_TABLET | Freq: Four times a day (QID) | ORAL | Status: DC | PRN
  Administered 2017-11-20 – 2017-11-21 (×4): 20 mg via ORAL
  Filled 2017-11-19 (×5): qty 4

## 2017-11-19 MED ORDER — MIDODRINE HCL 5 MG PO TABS
5.0000 mg | ORAL_TABLET | Freq: Three times a day (TID) | ORAL | Status: AC
  Administered 2017-11-19: 5 mg via ORAL
  Filled 2017-11-19 (×2): qty 1

## 2017-11-19 MED ORDER — ENOXAPARIN SODIUM 40 MG/0.4ML ~~LOC~~ SOLN: 40.0000 mg | SUBCUTANEOUS | Status: DC

## 2017-11-19 MED ORDER — LACOSAMIDE 50 MG PO TABS
100.0000 mg | ORAL_TABLET | Freq: Two times a day (BID) | ORAL | Status: DC
  Administered 2017-11-19 (×2): 100 mg via ORAL
  Filled 2017-11-19 (×2): qty 2

## 2017-11-19 MED ORDER — PIPERACILLIN-TAZOBACTAM 3.375 G IVPB
3.3750 g | Freq: Three times a day (TID) | INTRAVENOUS | Status: DC
  Administered 2017-11-19 – 2017-11-21 (×6): 3.375 g via INTRAVENOUS
  Filled 2017-11-19 (×7): qty 50

## 2017-11-19 MED ORDER — PREDNISONE 20 MG PO TABS: 10.0000 mg | ORAL_TABLET | Freq: Every day | ORAL | Status: DC

## 2017-11-19 MED ORDER — PREDNISONE 10 MG PO TABS
15.0000 mg | ORAL_TABLET | Freq: Every day | ORAL | Status: DC
  Administered 2017-11-20 – 2017-11-21 (×2): 15 mg via ORAL
  Filled 2017-11-19 (×3): qty 2

## 2017-11-19 MED ORDER — LACTULOSE 10 GM/15ML PO SOLN
20.0000 g | Freq: Two times a day (BID) | ORAL | Status: DC | PRN
  Administered 2017-11-20: 20 g via ORAL
  Filled 2017-11-19: qty 30

## 2017-11-19 MED ORDER — LEVETIRACETAM 500 MG PO TABS
500.0000 mg | ORAL_TABLET | Freq: Two times a day (BID) | ORAL | Status: DC
  Administered 2017-11-19 – 2017-11-21 (×7): 500 mg via ORAL
  Filled 2017-11-19 (×7): qty 1

## 2017-11-19 MED ORDER — ATORVASTATIN CALCIUM 10 MG PO TABS
10.0000 mg | ORAL_TABLET | Freq: Every day | ORAL | Status: DC
  Administered 2017-11-19 – 2017-11-26 (×6): 10 mg via ORAL
  Filled 2017-11-19 (×7): qty 1

## 2017-11-19 MED ORDER — KETOROLAC TROMETHAMINE 15 MG/ML IJ SOLN
15.0000 mg | Freq: Four times a day (QID) | INTRAMUSCULAR | Status: AC | PRN
Start: 2017-11-19 — End: 2017-11-24
  Administered 2017-11-20 – 2017-11-24 (×12): 15 mg via INTRAVENOUS
  Filled 2017-11-19 (×13): qty 1

## 2017-11-19 MED ORDER — INSULIN ASPART 100 UNIT/ML ~~LOC~~ SOLN
0.0000 [IU] | Freq: Three times a day (TID) | SUBCUTANEOUS | Status: DC
  Administered 2017-11-19 – 2017-11-20 (×4): 2 [IU] via SUBCUTANEOUS
  Administered 2017-11-20: 3 [IU] via SUBCUTANEOUS
  Administered 2017-11-21 (×3): 2 [IU] via SUBCUTANEOUS
  Administered 2017-11-24: 3 [IU] via SUBCUTANEOUS
  Administered 2017-11-24: 2 [IU] via SUBCUTANEOUS
  Administered 2017-11-25: 5 [IU] via SUBCUTANEOUS
  Administered 2017-11-25: 3 [IU] via SUBCUTANEOUS
  Administered 2017-11-25: 2 [IU] via SUBCUTANEOUS
  Administered 2017-11-26: 3 [IU] via SUBCUTANEOUS
  Administered 2017-11-26: 5 [IU] via SUBCUTANEOUS

## 2017-11-19 MED ORDER — SODIUM CHLORIDE 0.9 % IV BOLUS (SEPSIS)
1000.0000 mL | Freq: Once | INTRAVENOUS | Status: AC
  Administered 2017-11-19: 1000 mL via INTRAVENOUS

## 2017-11-19 MED ORDER — VANCOMYCIN HCL 500 MG IV SOLR
500.0000 mg | Freq: Two times a day (BID) | INTRAVENOUS | Status: DC
  Administered 2017-11-19 – 2017-11-21 (×4): 500 mg via INTRAVENOUS
  Filled 2017-11-19 (×5): qty 500

## 2017-11-19 MED ORDER — PANTOPRAZOLE SODIUM 40 MG PO TBEC
80.0000 mg | DELAYED_RELEASE_TABLET | Freq: Every day | ORAL | Status: DC
  Administered 2017-11-19 – 2017-11-21 (×3): 80 mg via ORAL
  Filled 2017-11-19 (×3): qty 2

## 2017-11-19 MED ORDER — MUPIROCIN 2 % EX OINT
1.0000 "application " | TOPICAL_OINTMENT | Freq: Two times a day (BID) | CUTANEOUS | Status: AC
  Administered 2017-11-19 – 2017-11-23 (×9): 1 via NASAL
  Filled 2017-11-19: qty 22

## 2017-11-19 MED ORDER — APIXABAN 5 MG PO TABS
5.0000 mg | ORAL_TABLET | Freq: Two times a day (BID) | ORAL | Status: DC
  Administered 2017-11-19 – 2017-11-21 (×5): 5 mg via ORAL
  Filled 2017-11-19 (×6): qty 1

## 2017-11-19 MED ORDER — INSULIN ASPART 100 UNIT/ML ~~LOC~~ SOLN
0.0000 [IU] | Freq: Every day | SUBCUTANEOUS | Status: DC
  Administered 2017-11-25: 2 [IU] via SUBCUTANEOUS

## 2017-11-19 NOTE — ED Notes (Addendum)
Admitting paged about pt BP dropping in the last 30 minutes. Will wait for orders.

## 2017-11-19 NOTE — ED Notes (Signed)
Admitting paged about bp still in the 70's after bolus

## 2017-11-19 NOTE — Progress Notes (Signed)
Paged for hypotension x2. Ordered additional liter bolus of fluids to total 3.5 liters as well as 160ml/hr normal saline continuous.  Assessed Patient in her room, mentation appeared at baseline, similar to admission. She was hypotensive MAP ~70, but with normal cardiac rate and respiratory rate while maintaining O2 sats well.  Ordered a dose of dexamethasone 4mg  IV due to concern for adrenal insufficiency. Patient has been on long term 15mg  daily dose of prednisone for at least one year as per patient. She stated that she ran out of the prednisone approximately one week prior and has been feeling progressively worse since that time.  Patient responded well to the fluid bolus and steroids. Will continue to monitor.   , MD

## 2017-11-19 NOTE — ED Notes (Signed)
Paged admitting/[facility] to [employer]

## 2017-11-19 NOTE — Progress Notes (Signed)
Text paged MD via amion to notify of patients sbp80's. Pt is still alert and oriented and asymptomatic. Will continue to monitor.

## 2017-11-19 NOTE — Progress Notes (Signed)
Patients bp is low 74/52-83/54. Patient is sleepy but arrousable and no mental status change. Notified MD. No orders received. Will continue to monitor.

## 2017-11-19 NOTE — Progress Notes (Signed)
Patients Family states patient does not take vimpat anymore. Also patient is concerned of a flair up of gout because of pain to her hands and feet. Notified MD. Will continue to monitor.

## 2017-11-19 NOTE — ED Notes (Signed)
Talked with admitting doctor about pt BP and admitting status as well. Will continue to monitor pt BP and give meds as prescribed.

## 2017-11-19 NOTE — ED Notes (Signed)
Nurse will change collect time for HIv to 0500

## 2017-11-19 NOTE — Progress Notes (Signed)
Subjective:  Amber [name] was seen lying in bed in NAD. She denies chest pain, shortness of breath, or headaches. Denies dizziness or lightheadedness. Mild abdominal pain, although denies N/V. States she has not had a BM in 2-3 days. Reports decreased PO intake for the last few weeks. Usually able to walk around with a walker although she states she is limited secondary to pain in her lower extremities. She lives at home with her granddaughter.  Objective:  Vital signs in last 24 hours: Vitals:   11/19/17 1000 11/19/17 1030 11/19/17 1118 11/19/17 1130  BP: (!) 86/60 94/82  101/74  Pulse: (!) 50 70  63  Resp: _0 Temp:      TempSrc:      SpO2:   100% 100%  Weight:      Height:       GEN: Elderly female lying in bed in NAD. Alert and oriented. Sleeping when we entered the room, although easily arousable. Does not open her eyes much, but responds to questions appropriately. RESP: Clear to auscultation bilaterally. No wheezes, rales, or rhonchi. No increased work of breathing. CV: Normal rate and regular rhythm. No murmurs, gallops, or rubs. 1+ LE edema. ABD: Soft. Mildly tender to palpation diffusely. Non-distended. Normoactive bowel sounds. No rebound or guarding. EXT: 1+ LE edema. Warm and well perfused. NEURO: Cranial nerves II-XII grossly intact. Unwilling/unable to lift her legs due to pain. No apparent audiovisual hallucinations. PSYCH: Patient is calm and pleasant. Appears sleepy but responds to questions appropriately. Appropriate affect. Well-groomed; speech is appropriate and on-subject.  Labs CBC Latest Ref Rng & Units 11/19/2017 11/18/2017 10/22/2017  WBC 4.0 - 10.5 K/uL 9.3 12.7(H) 7.0  Hemoglobin 12.0 - 15.0 g/dL 7.9(L) 8.8(L) 7.5(L)  Hematocrit 36.0 - 46.0 % 24.8(L) 27.3(L) 24.3(L)  Platelets 150 - 400 K/uL 151 157 171   CMP Latest Ref Rng & Units 11/19/2017 11/18/2017 10/22/2017  Glucose 65 - 99 mg/dL 128(H) 122(H) 154(H)  BUN 6 - 20 mg/dL _1 Creatinine  0.44 - 1.00 mg/dL 0.99 1.20(H) 1.07(H)  Sodium 135 - 145 mmol/L 142 139 140  Potassium 3.5 - 5.1 mmol/L 3.3(L) 3.3(L) 4.2  Chloride 101 - 111 mmol/L 118(H) 113(H) 116(H)  CO2 22 - 32 mmol/L 15(L) 18(L) 19(L)  Calcium 8.9 - 10.3 mg/dL 7.4(L) 7.9(L) 8.5(L)  Total Protein 6.5 - 8.1 g/dL - 5.3(L) 5.4(L)  Total Bilirubin 0.3 - 1.2 mg/dL - 0.4 0.2(L)  Alkaline Phos 38 - 126 U/L - 114 76  AST 15 - 41 U/L - 34 23  ALT 14 - 54 U/L - 19 15   UDS positive for opiates BCx NGTD x24h TSH 1.091 (nl) BNP 128.9 UA negative HIV Ab pending INR 1.35 Lactic acid 2.07 -> 1.39  Assessment/Plan:  Active Problems:   Microcytic anemia   Asthma   Diabetes mellitus type 2, controlled ([facility])   Chronic back pain   Essential hypertension   Seizure ([facility])   Hypotension  Amber [name] is a 61yo female with PMH significant for DM2, severe spinal stenosis with extensive hardware, severe RA (on chronic prednisone daily), seizure disorder, 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  Hypotension, AMS Met SIRS criteria on admission. Lactic acidosis, hypotension, leukocytosis, and AMS resolved with fluids. Did receive a dose  of Vanc/Zosyn in the ED. No clear source of infection, however we do not have a great explanation for her hypotension otherwise and she is chronically immunosuppressed with prednisone, so we will continue empiric antibiotics and await BCx results. She does have severe RA (on chronic prednisone), spinal stenosis with hardware, and DM2, all of which may be contributory factors for autonomic neuropathy, however this is less likely. Has received dexamethasone. Unlikely to have adrenal insufficiency given she has normonatremia and hypokalemia, which would be inconsistent with this diagnosis. - Continue vanc/zosyn - Hold  anti-hypertensives given hypotension - F/u BCx - IV dexamethasone 45m x1 dose this afternoon - IV NS 1053mhr x24h  Seizure disorder On home lacosamide, levetiracetam, and valproic acid. Patient reportedly not taking lacosamide at home because per family, "it makes her crazy". - Continue home levetiracetam 500 mg BID and valproic acid 50053mID  Hx of HTN Hypotensive here, on metoprolol 81m72may, torsemide 10mg56mly at home. Amlodipine was held on discharge from prior hospitalization. - Hold anti-hypertensives given hypotension  DM2 Hemoglobin A1c 4.7 in 10/2016. But patient is also chronically anemic. BG 110s-130s. Home regimen includes metformin 500mg 78my. - CBG monitoring - moderate SSI TID WC + HS  Chronic hypokalemia K 3.3. On home potassium 20 mEq BID. - Repeat BMP in AM - K-Dur 20mEq 97m Chronic back pain Work-up from prior admission with no acute changes noted. Home regimen includes oxycodone IR 20mg QI31m Continue gabapentin 800 mg 3 times daily - Continue home oxycodone 20mg QID36m for severe pain - Continue tizanidine 4mg QID P68mfor muscle spasms  Hx of multiple falls Likely secondary to polypharmacy and lower extremity pain and weakness 2/2 multiple vertebral body fusions T1-T9. The patient is able to ambulate with walker at baseline. Had a fall prior to admission. X-rays negative. - Continue to monitor - Will need follow up with outpatient Neurosurgery  Hx of embolic CVA Atrial fibrillation In NSR. HR in 60s-80s. Home regimen includes eliquis. CHADS2-VASc score of at least 5, with at least 10.0% annual risk of CVA. Has not been taking eliquis for at least 3 days. - Continue home eliquis 5mg BID  D55mo: Anticipated discharge pending clinical work-up.  Robertine Kipper, JennColbert Ewing2018, 12:33 PM Pager: [phone]

## 2017-11-19 NOTE — Progress Notes (Signed)
*  PRELIMINARY RESULTS* Echocardiogram 2D Echocardiogram has been performed.  Jeryl Columbia 11/19/2017, 11:44 AM

## 2017-11-19 NOTE — Progress Notes (Signed)
Pharmacy Antibiotic Note  Amber [name] is a 61 y.o. female admitted on 11/18/2017 with SIRS.  Pharmacy has been consulted for vancomycin and zosyn dosing.  Pt had one dose of vancomycin and zosyn on 12/30 at 1842.  Plan: Zosyn 3.375 g IV q 8 hrs (extended interval dosing) Vancomycin 500 mg IV q 12 hrs. F/u cultures, renal function and clinical course. Vancomycin trough at steady state as appropriate.  Height: 5' (152.4 cm) Weight: 123 lb 14.4 oz (56.2 kg) IBW/kg (Calculated) : 45.5  Temp (24hrs), Avg:98.2 F (36.8 C), Min:98.2 F (36.8 C), Max:98.2 F (36.8 C)  Recent Labs  Lab 11/18/17 1800 11/18/17 1821 11/18/17 1941 11/19/17 0355  WBC 12.7*  --   --  9.3  CREATININE 1.20*  --   --  0.99  LATICACIDVEN  --  2.07* 1.39  --     Estimated Creatinine Clearance: 48.1 mL/min (by C-G formula based on SCr of 0.99 mg/dL).    Allergies  Allergen Reactions  . Ivp Dye [Iodinated Diagnostic Agents] Itching  . Metrizamide Itching    Antimicrobials this admission: Vancomycin 12/30, 12/31> Zosyn 12/30, 12/31 >   Dose adjustments this admission:   Microbiology results: 12/30 BCx x 2:  12/31 MRSA PCR: positive  Thank you for allowing pharmacy to be a part of this patient's care.  Tad Moore, BCPS  Clinical Pharmacist Pager [phone]  11/19/2017 7:03 PM

## 2017-11-20 DIAGNOSIS — M4680 Other specified inflammatory spondylopathies, site unspecified: Secondary | ICD-10-CM

## 2017-11-20 DIAGNOSIS — Q211 Atrial septal defect: Secondary | ICD-10-CM

## 2017-11-20 DIAGNOSIS — Z7901 Long term (current) use of anticoagulants: Secondary | ICD-10-CM

## 2017-11-20 DIAGNOSIS — D6489 Other specified anemias: Secondary | ICD-10-CM

## 2017-11-20 DIAGNOSIS — I4891 Unspecified atrial fibrillation: Secondary | ICD-10-CM

## 2017-11-20 DIAGNOSIS — I950 Idiopathic hypotension: Principal | ICD-10-CM

## 2017-11-20 DIAGNOSIS — I10 Essential (primary) hypertension: Secondary | ICD-10-CM

## 2017-11-20 DIAGNOSIS — E119 Type 2 diabetes mellitus without complications: Secondary | ICD-10-CM

## 2017-11-20 LAB — GLUCOSE, CAPILLARY
GLUCOSE-CAPILLARY: 137 mg/dL — AB (ref 65–99)
Glucose-Capillary: 193 mg/dL — ABNORMAL HIGH (ref 65–99)
Glucose-Capillary: 137 mg/dL — ABNORMAL HIGH (ref 65–99)
Glucose-Capillary: 174 mg/dL — ABNORMAL HIGH (ref 65–99)

## 2017-11-20 LAB — BASIC METABOLIC PANEL
ANION GAP: 7 (ref 5–15)
BUN: 13 mg/dL (ref 6–20)
CHLORIDE: 121 mmol/L — AB (ref 101–111)
CO2: 14 mmol/L — AB (ref 22–32)
Calcium: 7.4 mg/dL — ABNORMAL LOW (ref 8.9–10.3)
Creatinine, Ser: 1.09 mg/dL — ABNORMAL HIGH (ref 0.44–1.00)
GFR calc non Af Amer: 54 mL/min — ABNORMAL LOW (ref >60)
GLUCOSE: 139 mg/dL — AB (ref 65–99)
Potassium: 3.7 mmol/L (ref 3.5–5.1)
Sodium: 142 mmol/L (ref 135–145)

## 2017-11-20 LAB — URINE CULTURE: CULTURE: NO GROWTH

## 2017-11-20 NOTE — Progress Notes (Signed)
Wasted Toradol 15 mg/ml. Melina Schools, RN witness.

## 2017-11-20 NOTE — Progress Notes (Signed)
Consult received regarding possible PFO closure. Dr Randolm Idol consult note from 10/31/2016 reviewed, plans were for outpatient follow up to consider closure after rehab. Patient currently admitted with hypotension of unclear etiology, echo yesterday showed hyperdynamic LV and normal RV function, no etiology for cardiogenic cause. Being treated for sepsis by primary team. Further workup for possible PFO closure would be planned as an outpatient, there is no indication for consideration of inpatient closure, particularly if there is any concern for possible ongoing infection. We will arrange an outpatient follow up with Dr Excell Seltzer. Please call us back if any new issues develop that require cardiology consultation.   Dina Rich MD

## 2017-11-20 NOTE — Evaluation (Addendum)
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 Pt reports generalized chronic pain with decreased mobility and transfers. Pt requires assist for all mobility at this time, does not have 24 hr care at home and is unsafe to even transfer to [facility] without significant assist. Pt has been holding voiding at home until family can arrive to assist. Pt with above and below deficits who will benefit from acute therapy to maximize mobility, strength, transfers and function to decrease burden of care and fall risk.   Supine BP 100/73 Sitting 117/81 Standing 108/87    Follow Up Recommendations SNF;Supervision/Assistance - 24 hour    Equipment Recommendations  Wheelchair (measurements PT)    Recommendations for Other Services       Precautions / Restrictions Precautions Precautions: Fall Restrictions Weight Bearing Restrictions: No      Mobility  Bed Mobility Overal bed mobility: Needs Assistance Bed Mobility: Supine to Sit     Supine to sit: Min assist     General bed mobility comments: with rail and increased time, assist to elevate trunk and scoot to EOB  Transfers Overall transfer level: Needs assistance   Transfers: Sit to/from Stand;Stand Pivot Transfers Sit to Stand: Mod assist Stand pivot transfers: Mod assist       General transfer comment: cues for hand placement, sequence and safety. Assist to rise and once standing pt with partial knee buckling with assist to maintain standing and pivot with RW bed>BSC>recliner  Ambulation/Gait              General Gait Details: unable to attempt with one person  Stairs            Wheelchair Mobility    Modified Rankin (Stroke Patients Only)       Balance Overall balance assessment: Needs assistance;History of Falls   Sitting balance-Leahy Scale: Fair       Standing balance-Leahy Scale: Poor                               Pertinent Vitals/Pain Pain Assessment: 0-10 Pain Score: 8  Pain Location: all over Pain Descriptors / Indicators: Aching Pain Intervention(s): Limited activity within patient's tolerance;Repositioned    Home Living Family/patient expects to be discharged to:: Private residence Living Arrangements: Other relatives Available Help at Discharge: Family;Available PRN/intermittently Type of Home: Apartment Home Access: Stairs to enter   Entrance Stairs-Number of Steps: 5 Home Layout: One level Home Equipment: Tub bench;Walker - 4 wheels;Bedside commode      Prior Function Level of Independence: Needs assistance   Gait / Transfers Assistance Needed: pt ambulates with rollator for household distance only but has frequent falls  ADL's / Homemaking Assistance Needed: grandaughter helps with bathing and dressing and does all the cooking and driving        Hand Dominance        Extremity/Trunk Assessment   Upper Extremity Assessment Upper Extremity Assessment: Generalized weakness    Lower Extremity Assessment Lower Extremity Assessment: Generalized weakness    Cervical / Trunk Assessment Cervical / Trunk  Assessment: Kyphotic  Communication   Communication: No difficulties  Cognition Arousal/Alertness: Awake/alert Behavior During Therapy: WFL for tasks assessed/performed Overall Cognitive Status: Impaired/Different from baseline Area of Impairment: Safety/judgement                         Safety/Judgement: Decreased awareness of safety;Decreased awareness of deficits            General Comments       Exercises     Assessment/Plan    PT Assessment Patient needs continued PT services  PT Problem List Decreased strength;Decreased mobility;Decreased safety awareness;Decreased activity tolerance;Decreased balance;Decreased knowledge of use of DME;Decreased cognition       PT Treatment Interventions DME instruction;Therapeutic activities;Cognitive remediation;Gait training;Therapeutic exercise;Patient/family education;Balance training;Functional mobility training    PT Goals (Current goals can be found in the Care Plan section)  Acute Rehab PT Goals Patient Stated Goal: return home PT Goal Formulation: With patient Time For Goal Achievement: 12/04/17 Potential to Achieve Goals: Fair    Frequency Min 3X/week   Barriers to discharge Decreased caregiver support granddaughter works so if pt needs to toilet she holds it until she returns    Co-evaluation               AM-PAC PT "6 Clicks" Daily Activity  Outcome Measure Difficulty turning over in bed (including adjusting bedclothes, sheets and blankets)?: Unable Difficulty moving from lying on back to sitting on the side of the bed? : Unable Difficulty sitting down on and standing up from a chair with arms (e.g., wheelchair, bedside commode, etc,.)?: Unable Help needed moving to and from a bed to chair (including a wheelchair)?: A Lot Help needed walking in hospital room?: A Lot Help needed climbing 3-5 steps with a railing? : Total 6 Click Score: 8    End of Session   Activity Tolerance: Patient limited by fatigue Patient left: in chair;with call bell/phone within reach;with chair alarm set Nurse Communication: Mobility status PT Visit Diagnosis: Unsteadiness on feet (R26.81);Other abnormalities of gait and mobility (R26.89);Muscle weakness (generalized) (M62.81);History of falling (Z91.81)    Time: 1937-9024 PT Time Calculation (min) (ACUTE ONLY): 27 min   Charges:   PT Evaluation $PT Eval Moderate Complexity: 1  Mod PT Treatments $Therapeutic Activity: 8-22 mins   PT G Codes:        Delaney Meigs, PT [phone]   Cate Oravec B Mclean Moya 11/20/2017, 1:20 PM

## 2017-11-20 NOTE — Progress Notes (Signed)
Order received for orthostatic blood pressures, pt refuses to get up at this time. Pt states she might get up later for blood pressures.

## 2017-11-20 NOTE — Progress Notes (Signed)
Medicine attending: I examined this patient today together with resident physician Dr. Scherrie Gerlach and I concur with her evaluation and management plan which we discussed together. Blood pressures remain low off all antihypertensives.  We elected to start broad-spectrum antibiotics to cover possibility of sepsis. She is alert, awake, oriented, rales at the lung bases left greater than right.  Increased mobility and strength of her lower extremities.  Now 5/5 strength in flexion and extension at her ankles. Impression: 1.  Idiopathic hypotension with normal pump function on echocardiogram this admission.  She is on chronic prednisone.  We missed an opportunity to do an ACTH stim test since she got a dose of prednisone.  We will continue antibiotics times 72 hours until cultures confirmed negative.  Attempt to mobilize the patient and monitor positional blood pressures.  Consider a trial of midodrine.  2.  Rheumatoid arthritis on chronic steroids limiting mobility and contributing to frequent falls.  3.  Expect intensive degenerative arthritis of the spine status post spinal fusion procedures with resulting distal neuropathy.  Rule out possible contribution of autonomic insufficiency as component of her hypotension.  4.  Status post embolic stroke with findings of a large PFO.  Prior to admission plan was to do a closure procedure.  Delayed because of other medical issues.  We will address again during this admission.

## 2017-11-20 NOTE — Plan of Care (Signed)
Pt. Progressing towards recovery.

## 2017-11-20 NOTE — Progress Notes (Signed)
Subjective:  Amber [name] was seen lying in bed in NAD. She is much more alert this morning. She denies chest pain, shortness of breath, dizziness, or lightheadedness. Asking for breakfast. Has not been able to get up and walk around, but is amenable to trying today.  Objective:  Vital signs in last 24 hours: Vitals:   11/19/17 2002 11/19/17 2330 11/20/17 0400 11/20/17 0800  BP: 106/68 (!) 84/62 104/74   Pulse: 83 77 81   Resp: '17 18 16   '$ Temp: 98.3 F (36.8 C) 98.1 F (36.7 C) 98.4 F (36.9 C) 98.2 F (36.8 C)  TempSrc: Oral Axillary Oral Oral  SpO2: 100% 100% 100%   Weight:      Height:       GEN: Elderly female lying in bed in NAD. Alert and oriented. RESP: Bibasilar crackles, otherwise clear to auscultation bilaterally. No increased work of breathing. CV: Normal rate and regular rhythm. No murmurs, gallops, or rubs. 2+ LE edema. ABD: Soft. Non-tender. Non-distended. Normoactive bowel sounds. No rebound or guarding. EXT: 2+ LE edema. Warm and well perfused. NEURO: Cranial nerves II-XII grossly intact. 4/5 strength in BLE. No apparent audiovisual hallucinations. PSYCH: Patient is calm and pleasant. Appropriate affect. Well-groomed; speech is appropriate and on-subject.  Labs CBC Latest Ref Rng & Units 11/19/2017 11/18/2017 10/22/2017  WBC 4.0 - 10.5 K/uL 9.3 12.7(H) 7.0  Hemoglobin 12.0 - 15.0 g/dL 7.9(L) 8.8(L) 7.5(L)  Hematocrit 36.0 - 46.0 % 24.8(L) 27.3(L) 24.3(L)  Platelets 150 - 400 K/uL 151 157 171   CMP Latest Ref Rng & Units 11/20/2017 11/19/2017 11/18/2017  Glucose 65 - 99 mg/dL 139(H) 128(H) 122(H)  BUN 6 - 20 mg/dL '13 13 15  '$ Creatinine 0.44 - 1.00 mg/dL 1.09(H) 0.99 1.20(H)  Sodium 135 - 145 mmol/L 142 142 139  Potassium 3.5 - 5.1 mmol/L 3.7 3.3(L) 3.3(L)  Chloride 101 - 111 mmol/L 121(H) 118(H) 113(H)  CO2 22 - 32 mmol/L 14(L) 15(L) 18(L)  Calcium 8.9 - 10.3 mg/dL 7.4(L) 7.4(L) 7.9(L)  Total Protein 6.5 - 8.1 g/dL - - 5.3(L)  Total Bilirubin 0.3 - 1.2  mg/dL - - 0.4  Alkaline Phos 38 - 126 U/L - - 114  AST 15 - 41 U/L - - 34  ALT 14 - 54 U/L - - 19   BCx NGTD x24h HIV Ab pending UCx negative  Assessment/Plan:  Amber [name] is a 62yo female with PMH significant for DM2, severe spinal stenosis with extensive hardware, severe RA (on chronic prednisone daily), seizure disorder, 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  Hypotension Met SIRS criteria on admission. Lactic acidosis, hypotension, leukocytosis, and AMS resolved with fluids. Did receive a dose of Vanc/Zosyn in the ED. No clear source of infection, however given she is chronically immunosuppressed with prednisone, we will continue empiric antibiotics x72 hours until cultures are negative. BCx NGTD x24h. UA and UCx negative. Her blood pressure is improved while on antibiotics. She does have severe RA (on chronic prednisone), spinal stenosis with hardware, and DM2, which may be contributory factors for autonomic neuropathy, however this is less likely. Unlikely to have adrenal insufficiency given she has normonatremia and hypokalemia, which would be inconsistent with this diagnosis. - Continue vanc/zosyn - Hold anti-hypertensives given hypotension - F/u BCx - PT eval; ambulate as able -  Can consider midodrine if no improvement  Seizure disorder Home regimen includes lacosamide, levetiracetam, and valproic acid. Patient reportedly not taking lacosamide at home because per family, "it makes her crazy". - Continue home levetiracetam 500 mg BID and valproic acid '500mg'$  BID  Hx of HTN Normotensive here, on metoprolol '25mg'$  qday, torsemide '10mg'$  daily at home. Amlodipine was held on discharge from prior hospitalization. - Hold anti-hypertensives given hypotension  DM2 Hemoglobin A1c 4.7 in 10/2016. But patient  is also chronically anemic. BG 110s-130s. Home regimen includes metformin '500mg'$  daily. - CBG monitoring - moderate SSI TID WC + HS  Chronic hypokalemia K 3.7. On home potassium 20 mEq BID. - Repeat BMP in AM - K-Dur 48mq BID  Chronic back pain Work-up from prior admission with no acute changes noted. Home regimen includes oxycodone IR '20mg'$  QID. Patient denies pain this morning. - Continue gabapentin 800 mg 3 times daily - Continue home oxycodone '20mg'$  QID PRN for severe pain with hold parameters - Continue tizanidine '4mg'$  QID PRN for muscle spasms - Toradol q6h PRN for pain  Hx of multiple falls Likely secondary to polypharmacy and lower extremity pain and weakness 2/2 multiple vertebral body fusions T1-T9. The patient is able to ambulate with walker at baseline. Had a fall prior to admission. X-rays negative. - Continue to monitor - Will need follow up with outpatient Neurosurgery  Hx of embolic CVA, TEE in 095/1884showed large PFO Atrial fibrillation In NSR. HR in 80s-90s. Home regimen includes eliquis. CHADS2-VASc score of at least 5, with at least 10.0% annual risk of CVA. She had elected to go forward with PFO closure outpatient, however this was delayed because of other medical issues. - Continue home eliquis '5mg'$  BID - Cardiology consulted for possible PFO closure after her BCx are negative and hypotension improved  Dispo: Anticipated discharge pending clinical work-up.  HColbert Ewing MD 11/20/2017, 9:40 AM Pager: PMamie [phone]

## 2017-11-21 ENCOUNTER — Inpatient Hospital Stay (HOSPITAL_COMMUNITY): Payer: Medicaid Other

## 2017-11-21 ENCOUNTER — Other Ambulatory Visit: Payer: Self-pay

## 2017-11-21 DIAGNOSIS — D649 Anemia, unspecified: Secondary | ICD-10-CM

## 2017-11-21 DIAGNOSIS — R0989 Other specified symptoms and signs involving the circulatory and respiratory systems: Secondary | ICD-10-CM

## 2017-11-21 DIAGNOSIS — M4800 Spinal stenosis, site unspecified: Secondary | ICD-10-CM

## 2017-11-21 LAB — BASIC METABOLIC PANEL
Anion gap: 7 (ref 5–15)
BUN: 14 mg/dL (ref 6–20)
CHLORIDE: 119 mmol/L — AB (ref 101–111)
CO2: 16 mmol/L — ABNORMAL LOW (ref 22–32)
CREATININE: 1.22 mg/dL — AB (ref 0.44–1.00)
Calcium: 7.7 mg/dL — ABNORMAL LOW (ref 8.9–10.3)
GFR, EST AFRICAN AMERICAN: 55 mL/min — AB (ref >60)
GFR, EST NON AFRICAN AMERICAN: 48 mL/min — AB (ref >60)
Glucose, Bld: 89 mg/dL (ref 65–99)
POTASSIUM: 4.8 mmol/L (ref 3.5–5.1)
SODIUM: 142 mmol/L (ref 135–145)

## 2017-11-21 LAB — GLUCOSE, CAPILLARY
GLUCOSE-CAPILLARY: 133 mg/dL — AB (ref 65–99)
GLUCOSE-CAPILLARY: 150 mg/dL — AB (ref 65–99)
GLUCOSE-CAPILLARY: 104 mg/dL — AB (ref 65–99)
Glucose-Capillary: 133 mg/dL — ABNORMAL HIGH (ref 65–99)

## 2017-11-21 MED ORDER — INFLUENZA VAC SPLIT QUAD 0.5 ML IM SUSY: 0.5000 mL | PREFILLED_SYRINGE | INTRAMUSCULAR | Status: DC

## 2017-11-21 MED ORDER — FUROSEMIDE 10 MG/ML IJ SOLN: 20.0000 mg | Freq: Once | INTRAMUSCULAR | Status: DC

## 2017-11-21 MED ORDER — SENNOSIDES-DOCUSATE SODIUM 8.6-50 MG PO TABS: 2.0000 | ORAL_TABLET | Freq: Two times a day (BID) | ORAL | Status: DC | PRN

## 2017-11-21 NOTE — Progress Notes (Signed)
Medicine attending: I examined this patient today together with resident physician Dr. Rozann Lesches and I concur with her evaluation and management plan which we discussed together. Overall condition stable.  Blood pressure remains low but acceptable with systolics now consistently 100 or above.  She remains afebrile.  Some difficulty breathing this morning with upper airway noise and rales at the lung bases.  Persistent lower extremity edema not much changed from admission. Oxygenation 98% on room air.  We will get a chest x-ray for further evaluation. Survey blood cultures now negative at 72 hours.  It is safe to stop empiric antibiotics at this time. We will try to mobilize the patient.  Physical therapy evaluation done yesterday.  Patient requires assistance for all mobility and is felt to be unsafe to transfer even to the bedside commode without significant assistance.  Skilled nursing facility placement recommended.  We discussed this with the patient who is agreeable. We will continue to monitor her blood pressure as she gets mobilized. Cardiology recommends deferring closure of PFO when patient is more stable as an outpatient. Anticipate discharge in the next 24-48 hours.

## 2017-11-21 NOTE — Plan of Care (Signed)
Continue current care plan 

## 2017-11-21 NOTE — Progress Notes (Signed)
Subjective:  Patient seen sitting comfortably in bed eating breakfast in no acute distress. Patient states that she feels OK and denies any episodes of dizziness or headaches. Patient continues to endorse lower back pain and states it was too painful to work with PT yesterday as a result.  Objective:  Vital signs in last 24 hours: Vitals:   11/20/17 1500 11/20/17 1638 11/20/17 1957 11/21/17 0019  BP:  104/71 117/78 109/77  Pulse: 74 75 82 74  Resp: '13 15 16 11  '$ Temp:  98.6 F (37 C) 98.3 F (36.8 C) 98.2 F (36.8 C)  TempSrc:  Oral Oral Oral  SpO2: 100% 92% 96% 98%  Weight:      Height:       Physical Exam  Constitutional: She appears well-developed and well-nourished. No distress.  HENT:  Mouth/Throat: Oropharynx is clear and moist.  Cardiovascular: Normal rate, regular rhythm and intact distal pulses. Exam reveals no friction rub.  No murmur heard. Respiratory:  Patient speaking in full sentences on exam without accessory muscle use or nasal flaring. No signs of cyanosis. Bibasilar crackles appreciated. Intermittent wheezing with loud, transmitted upper airway sounds throughout.  GI: Soft. She exhibits no distension. There is no tenderness.  Musculoskeletal: She exhibits edema (1+ pitting edema to knees bilaterally). She exhibits no tenderness (1+ pitting edema to knees bilaterally).  Skin: Skin is warm and dry. No rash noted. No erythema.   Assessment/Plan:  Ms. Mcmeans is a 62yo female with PMH significant for DM2, severe spinal stenosis with extensive hardware, severe RA (on chronic prednisone daily), seizure disorder, 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 She was admitted to the internal medicine teaching service for management. The specific problems addressed during admission are as  follows:  Hypotension: Patient met SIRS criteteria on admission and started on broad spectrum antibiotics. Her AMS, lactic acidosis, hypotension, leukocytosis, and AMS resolved with fluids and antibiotics. Infectious workup negative thus far, urine and blood cultures no growth to date (48 hors). Given she is chronically immunosuppressed with prednisone, we will continue empiric antibiotics x72 hours until cultures are negative. Her blood pressure is improved while on antibiotics and remained SBP >100 for past 24 hours. -Continue vanc/zosyn, blood cultures negative 72 hours, likely D/C later today -Orthostatic vital signs -PT recommending SNF, will consult SW for placement  SOB, wheezing: Patient endorsed wheezing and difficulty breathing this AM. Patient saturating >92% on room air. No obvious wheezing on physical exam, but bibasilar crackles appreciated. Chest X ray did not show any acute infiltrate, pulmonary edema, or atelectasis. Patient had recent echo showing LVEF 65-70% with G1DD on 11/19/2017. Peripheral edema on exam and on torsemide at home. Will attempt supportive care for now and monitor volume status. Will plan to restart torsemide as BP allows. -Continue Dulera inhaler 2 puffs BID -Incentive spirometry with respiratory to encourage airway clearing  Seizure disorder: No clinical signs or symptoms of seizures while inpatient.  -Continue home levetiracetam 500 mg BID and valproic acid '500mg'$  BID  DM2: Problem well controlled at baseline with Hemoglobin A1c 4.7 in 10/2016 (but patient has chronic anemia). BG appropriate in hospital while holding home metformin 500 mg daily and using SSI insulin with meals -CBG monitoring -Moderate SSI TID with meals and at bedtime  Hx of HTN: Holding home metoprolol  $'25mg't$  qday, torsemide '10mg'$  daily given above -Hold anti-hypertensives given hypotension  Chronic hypokalemia: K = 4.8 this AM with hemolysis noted. Will repeat tomorrow morning.  -Continue  home potassium 20 mEq BID  Chronic back pain: Work-up from prior admission with no acute changes noted. Home regimen includes oxycodone IR '20mg'$  QID. Patient denies pain this morning. - Continue home gabapentin 800 mg TID,  oxycodone '20mg'$  QID PRN for severe pain with hold parameters, and tizanidine '4mg'$  QID PRN for muscle spasms -Toradol q6h PRN for pain  Hx of multiple falls: Likely secondary to polypharmacy and lower extremity pain/weakness 2/2 multiple vertebral body fusions T1-T9.  -Continue to monitor -PT as tolerated -Will need follow up with outpatient Neurosurgery  Hx of embolic CVA in setting of A-Fib with TEE in 11/2016 that showed large PFO: Patient currently in NSR. Cardiology recommends follow up as outpatient for closure of PFO. -Continue home Eliquis '5mg'$  BID  FEN/GI: -Carb Modified diet -No IVF -Protonix 80 mg daily  VTE Prophylaxis: Eliquis 5 mg BID Code Status: Full  Dispo: Anticipated discharge pending clinical work-up.  Thomasene Ripple, MD 11/21/2017, 7:11 AM Pager: Mamie Nick [phone]

## 2017-11-21 NOTE — Care Management (Signed)
Pt does not have LTACH benefits.  Discharge plan will be SNF when medically ready

## 2017-11-22 ENCOUNTER — Inpatient Hospital Stay (HOSPITAL_COMMUNITY): Payer: Medicaid Other

## 2017-11-22 DIAGNOSIS — Z978 Presence of other specified devices: Secondary | ICD-10-CM

## 2017-11-22 DIAGNOSIS — R1110 Vomiting, unspecified: Secondary | ICD-10-CM

## 2017-11-22 DIAGNOSIS — K5900 Constipation, unspecified: Secondary | ICD-10-CM

## 2017-11-22 LAB — CBC
HEMATOCRIT: 30.1 % — AB (ref 36.0–46.0)
HEMOGLOBIN: 9.6 g/dL — AB (ref 12.0–15.0)
MCH: 23.8 pg — AB (ref 26.0–34.0)
MCHC: 31.9 g/dL (ref 30.0–36.0)
MCV: 74.7 fL — ABNORMAL LOW (ref 78.0–100.0)
Platelets: 192 10*3/uL (ref 150–400)
RBC: 4.03 MIL/uL (ref 3.87–5.11)
RDW: 19.6 % — ABNORMAL HIGH (ref 11.5–15.5)
WBC: 9.4 10*3/uL (ref 4.0–10.5)

## 2017-11-22 LAB — BASIC METABOLIC PANEL WITH GFR
Anion gap: 7 (ref 5–15)
BUN: 17 mg/dL (ref 6–20)
CO2: 15 mmol/L — ABNORMAL LOW (ref 22–32)
Calcium: 7.9 mg/dL — ABNORMAL LOW (ref 8.9–10.3)
Chloride: 117 mmol/L — ABNORMAL HIGH (ref 101–111)
Creatinine, Ser: 1.48 mg/dL — ABNORMAL HIGH (ref 0.44–1.00)
GFR calc Af Amer: 44 mL/min — ABNORMAL LOW (ref >60)
GFR calc non Af Amer: 38 mL/min — ABNORMAL LOW (ref >60)
Glucose, Bld: 109 mg/dL — ABNORMAL HIGH (ref 65–99)
Potassium: 4.2 mmol/L (ref 3.5–5.1)
Sodium: 139 mmol/L (ref 135–145)

## 2017-11-22 LAB — GLUCOSE, CAPILLARY
GLUCOSE-CAPILLARY: 146 mg/dL — AB (ref 65–99)
GLUCOSE-CAPILLARY: 65 mg/dL (ref 65–99)
Glucose-Capillary: 64 mg/dL — ABNORMAL LOW (ref 65–99)
Glucose-Capillary: 98 mg/dL (ref 65–99)
Glucose-Capillary: 159 mg/dL — ABNORMAL HIGH (ref 65–99)
Glucose-Capillary: 102 mg/dL — ABNORMAL HIGH (ref 65–99)
Glucose-Capillary: 102 mg/dL — ABNORMAL HIGH (ref 65–99)

## 2017-11-22 LAB — APTT: aPTT: 88 seconds — ABNORMAL HIGH (ref 24–36)

## 2017-11-22 MED ORDER — ONDANSETRON HCL 4 MG/2ML IJ SOLN: 4.0000 mg | Freq: Four times a day (QID) | INTRAMUSCULAR | Status: DC | PRN

## 2017-11-22 MED ORDER — SODIUM CHLORIDE 0.9 % IV SOLN
500.0000 mg | Freq: Two times a day (BID) | INTRAVENOUS | Status: DC
  Administered 2017-11-22 – 2017-11-24 (×5): 500 mg via INTRAVENOUS
  Filled 2017-11-22 (×5): qty 5

## 2017-11-22 MED ORDER — CHLORHEXIDINE GLUCONATE 0.12 % MT SOLN
15.0000 mL | Freq: Two times a day (BID) | OROMUCOSAL | Status: DC
  Administered 2017-11-22 – 2017-11-25 (×6): 15 mL via OROMUCOSAL
  Filled 2017-11-22 (×6): qty 15

## 2017-11-22 MED ORDER — SODIUM CHLORIDE 0.9 % IV SOLN: 80.0000 mg | Freq: Every day | INTRAVENOUS | Status: DC

## 2017-11-22 MED ORDER — DIATRIZOATE MEGLUMINE & SODIUM 66-10 % PO SOLN
90.0000 mL | Freq: Once | ORAL | Status: AC
  Administered 2017-11-22: 90 mL via NASOGASTRIC
  Filled 2017-11-22: qty 90

## 2017-11-22 MED ORDER — DEXTROSE 5 % IV SOLN
500.0000 mg | Freq: Two times a day (BID) | INTRAVENOUS | Status: DC
  Administered 2017-11-22 – 2017-11-24 (×5): 500 mg via INTRAVENOUS
  Filled 2017-11-22 (×5): qty 5

## 2017-11-22 MED ORDER — DEXTROSE 50 % IV SOLN
INTRAVENOUS | Status: AC
  Administered 2017-11-22: 50 mL
  Filled 2017-11-22: qty 50

## 2017-11-22 MED ORDER — DIPHENHYDRAMINE HCL 50 MG/ML IJ SOLN: 25.0000 mg | Freq: Once | INTRAMUSCULAR | Status: DC

## 2017-11-22 MED ORDER — SODIUM CHLORIDE 0.9 % IV SOLN
INTRAVENOUS | Status: DC
  Administered 2017-11-22 (×2): via INTRAVENOUS

## 2017-11-22 MED ORDER — ORAL CARE MOUTH RINSE
15.0000 mL | Freq: Two times a day (BID) | OROMUCOSAL | Status: DC
  Administered 2017-11-22 – 2017-11-26 (×8): 15 mL via OROMUCOSAL

## 2017-11-22 MED ORDER — ONDANSETRON HCL 4 MG/2ML IJ SOLN: 4.0000 mg | Freq: Three times a day (TID) | INTRAMUSCULAR | Status: DC | PRN

## 2017-11-22 MED ORDER — METHYLPREDNISOLONE SODIUM SUCC 40 MG IJ SOLR
10.0000 mg | Freq: Every day | INTRAMUSCULAR | Status: DC
  Administered 2017-11-22 – 2017-11-24 (×3): 10 mg via INTRAVENOUS
  Filled 2017-11-22 (×3): qty 1

## 2017-11-22 MED ORDER — PANTOPRAZOLE SODIUM 40 MG IV SOLR
40.0000 mg | Freq: Every day | INTRAVENOUS | Status: DC
  Administered 2017-11-22 – 2017-11-23 (×2): 40 mg via INTRAVENOUS
  Filled 2017-11-22 (×2): qty 40

## 2017-11-22 MED ORDER — HEPARIN (PORCINE) IN NACL 100-0.45 UNIT/ML-% IJ SOLN
700.0000 [IU]/h | INTRAMUSCULAR | Status: DC
  Administered 2017-11-22: 850 [IU]/h via INTRAVENOUS
  Filled 2017-11-22: qty 250

## 2017-11-22 NOTE — Consult Note (Signed)
[facility] Surgery Consult Note  Amber [name] [birth year]  [id number].    Requesting MD: Murriel Hopper Chief Complaint/Reason for Consult: SBO  HPI:  Amber [name] is a 62yo female PMH RA on chronic steroids and chronic low back pain on gabapentin and oxycodone 71m at home, admitted to [facility] with fever, hypotension and AMS. Patient currently somnolent, no family at bedside, therefore the information in this HPI was taken from the chart. Patient was admitted and started on empiric antibiotics. Lactic acidosis, hypotension, leukocytosis, and AMS resolved with fluids. Per chart no clear source of infection found but patient was slowly improving until yesterday. She was tolerating a diet and had a BM yesterday morning. Patient started complaining of diffuse abdominal pain and distension. She had 1 episode of projectile vomiting. NG tube was placed. CT scan showed high-grade small bowel obstruction in the mid ileum, moderate to large amount of fecal matter throughout the colon. General surgery asked to see for SBO.  -PMH significant for Rheumatoid arthritis on chronic steroid, DM, HTN, Seizure disorder, Chronic back pain, H/o embolic CVA, Atrial fibrillation, large PFO -Abdominal surgical history: appendectomy, cholecystectomy -Anticoagulants: on eliquis at home -Former smoker -Lives home with granddaughter  ROS unable to fully assess ROS due to mental status   Family History  Problem Relation Age of Onset  . Kidney failure Mother   . Hypertension Sister   . Hypertension Brother     Past Medical History:  Diagnosis Date  . Arthritis   . Asthma   . Chronic pain   . Constipation   . [place]   . [place] stroke [place]   . Falls frequently   . GERD (gastroesophageal reflux disease)   . Hypertension   . Pneumonia 10/2015  . Seizures ([place]    last seizure March 2015    Past Surgical History:  Procedure Laterality Date  .  APPENDECTOMY    . BACK SURGERY    . CHOLECYSTECTOMY    . TEE WITHOUT CARDIOVERSION N/A 10/30/2016   Procedure: TRANSESOPHAGEAL ECHOCARDIOGRAM (TEE);  Surgeon: KDorothy Spark MD;  [facility] [facility]  Service: Cardiovascular;  Laterality: N/A;    Social History:  reports that she quit smoking about 9 years ago. she has never used smokeless tobacco. She reports that she does not drink alcohol or use drugs.  Allergies:  Allergies  Allergen Reactions  . Ivp Dye [Iodinated Diagnostic Agents] Itching  . Metrizamide Itching    Medications Prior to Admission  Medication Sig Dispense Refill  . apixaban (ELIQUIS) 5 MG TABS tablet Take 5 mg by mouth 2 (two) times daily.    . metFORMIN (GLUCOPHAGE-XR) 500 MG 24 hr tablet Take 500 mg by mouth daily.  3  . metoprolol succinate (TOPROL-XL) 25 MG 24 hr tablet Take 25 mg by mouth daily.    .Marland KitchenoxyCODONE (OXYCONTIN) 20 mg 12 hr tablet Take 20 mg by mouth 2 (two) times daily.    . Oxycodone HCl 20 MG TABS Take 1 tablet by mouth 4 (four) times daily.  0  . predniSONE (DELTASONE) 5 MG tablet Take 5 mg by mouth daily.  5  . triamcinolone (KENALOG) 0.025 % cream Apply 1 application topically daily as needed (rash/itching).   0  . valproic acid (DEPAKENE) 250 MG capsule Take 500 mg by mouth every 12 (twelve) hours.     .Marland Kitchenalbuterol (PROVENTIL HFA;VENTOLIN HFA) 108 (90 BASE) MCG/ACT inhaler Inhale 2 puffs into the lungs every 6 (six) hours as  needed for wheezing or shortness of breath.    . allopurinol (ZYLOPRIM) 300 MG tablet Take 300 mg by mouth daily.  11  . aspirin EC 325 MG EC tablet Take 1 tablet (325 mg total) by mouth daily. 30 tablet 0  . atorvastatin (LIPITOR) 10 MG tablet Take 10 mg by mouth daily.  5  . feeding supplement, ENSURE ENLIVE, (ENSURE ENLIVE) LIQD Take 237 mLs by mouth 2 (two) times daily between meals. (Patient taking differently: Take 237 mLs by mouth 2 (two) times daily between meals. strawberry) 60 Bottle 0  .  Fluticasone-Salmeterol (ADVAIR) 250-50 MCG/DOSE AEPB Inhale 2 puffs into the lungs as needed.    . folic acid (FOLVITE) 1 MG tablet Take 1 mg by mouth daily.    Marland Kitchen gabapentin (NEURONTIN) 400 MG capsule Take 2 capsules (800 mg total) by mouth 3 (three) times daily. 90 capsule 0  . lacosamide 100 MG TABS Take 1 tablet (100 mg total) by mouth 2 (two) times daily. 60 tablet 2  . lactulose (CHRONULAC) 10 GM/15ML solution Take 30 mLs (20 g total) by mouth 2 (two) times daily as needed for severe constipation. 240 mL 0  . levETIRAcetam (KEPPRA) 500 MG tablet Take 500 mg by mouth 2 (two) times daily.    Marland Kitchen lidocaine (LIDODERM) 5 % Place 1 patch onto the skin daily. Remove & Discard patch within 12 hours or as directed by MD 30 patch 0  . loperamide (IMODIUM) 2 MG capsule Take 1 capsule (2 mg total) by mouth 4 (four) times daily as needed for diarrhea or loose stools. 12 capsule 0  . potassium chloride 20 MEQ TBCR Take 40 mEq by mouth daily. (Patient taking differently: Take 20 mEq by mouth 2 (two) times daily. ) 30 tablet 1  . torsemide (DEMADEX) 10 MG tablet Take 10 mg by mouth daily.    . Vitamin D, Ergocalciferol, (DRISDOL) 50000 UNITS CAPS capsule Take 1 capsule (50,000 Units total) by mouth every 7 (seven) days. Sunday (Patient taking differently: Take 50,000 Units by mouth every 30 (thirty) days. ) 30 capsule 0    Prior to Admission medications   Medication Sig Start Date End Date Taking? Authorizing Provider  apixaban (ELIQUIS) 5 MG TABS tablet Take 5 mg by mouth 2 (two) times daily.   Yes [provider]  metFORMIN (GLUCOPHAGE-XR) 500 MG 24 hr tablet Take 500 mg by mouth daily. 11/11/16  Yes [provider]  metoprolol succinate (TOPROL-XL) 25 MG 24 hr tablet Take 25 mg by mouth daily.   Yes [provider]  oxyCODONE (OXYCONTIN) 20 mg 12 hr tablet Take 20 mg by mouth 2 (two) times daily.   Yes [provider]  Oxycodone HCl 20 MG TABS Take 1 tablet by mouth 4  (four) times daily. 11/16/17  Yes [provider]  predniSONE (DELTASONE) 5 MG tablet Take 5 mg by mouth daily. 11/07/17  Yes [provider]  triamcinolone (KENALOG) 0.025 % cream Apply 1 application topically daily as needed (rash/itching).  08/21/17  Yes [provider]  valproic acid (DEPAKENE) 250 MG capsule Take 500 mg by mouth every 12 (twelve) hours.  07/16/17  Yes [provider]  albuterol (PROVENTIL HFA;VENTOLIN HFA) 108 (90 BASE) MCG/ACT inhaler Inhale 2 puffs into the lungs every 6 (six) hours as needed for wheezing or shortness of breath.    [provider]  allopurinol (ZYLOPRIM) 300 MG tablet Take 300 mg by mouth daily. 11/11/16   [provider]  aspirin EC 325 MG EC tablet Take 1 tablet (325 mg total) by mouth daily. 10/31/16   Reyne Dumas, MD  atorvastatin (LIPITOR) 10 MG tablet Take 10 mg by mouth daily. 08/13/15   [provider]  feeding supplement, ENSURE ENLIVE, (ENSURE ENLIVE) LIQD Take 237 mLs by mouth 2 (two) times daily between meals. Patient taking differently: Take 237 mLs by mouth 2 (two) times daily between meals. strawberry 11/10/15   Orson Eva, MD  Fluticasone-Salmeterol (ADVAIR) 250-50 MCG/DOSE AEPB Inhale 2 puffs into the lungs as needed.    [provider]  folic acid (FOLVITE) 1 MG tablet Take 1 mg by mouth daily.    [provider]  gabapentin (NEURONTIN) 400 MG capsule Take 2 capsules (800 mg total) by mouth 3 (three) times daily. 10/23/17   Kathi Ludwig, MD  lacosamide 100 MG TABS Take 1 tablet (100 mg total) by mouth 2 (two) times daily. 10/31/16   Reyne Dumas, MD  lactulose (CHRONULAC) 10 GM/15ML solution Take 30 mLs (20 g total) by mouth 2 (two) times daily as needed for severe constipation. 08/22/16   Isla Pence, MD  levETIRAcetam (KEPPRA) 500 MG tablet Take 500 mg by mouth 2 (two) times daily. 07/16/17   [provider]  lidocaine (LIDODERM) 5 % Place 1  patch onto the skin daily. Remove & Discard patch within 12 hours or as directed by MD 10/23/17   Kathi Ludwig, MD  loperamide (IMODIUM) 2 MG capsule Take 1 capsule (2 mg total) by mouth 4 (four) times daily as needed for diarrhea or loose stools. 04/16/17   Gareth Morgan, MD  potassium chloride 20 MEQ TBCR Take 40 mEq by mouth daily. Patient taking differently: Take 20 mEq by mouth 2 (two) times daily.  11/25/16 10/21/17  Reyne Dumas, MD  torsemide (DEMADEX) 10 MG tablet Take 10 mg by mouth daily.    [provider]  Vitamin D, Ergocalciferol, (DRISDOL) 50000 UNITS CAPS capsule Take 1 capsule (50,000 Units total) by mouth every 7 (seven) days. Sunday Patient taking differently: Take 50,000 Units by mouth every 30 (thirty) days.  01/27/15   Debbe Odea, MD    Blood pressure (!) 120/93, pulse (!) 103, temperature 98 F (36.7 C), temperature source Oral, resp. rate (!) 21, height 5' (1.524 m), weight 123 lb 14.4 oz (56.2 kg), SpO2 98 %. Physical Exam: General: somnolent, difficult to arouse, WD/WN AA female who is laying in bed in NAD HEENT: head is normocephalic, atraumatic.  Sclera are noninjected.  Pupils equal and round.  Ears and nose without any masses or lesions.  Mouth is pink and moist. Dentition fair. NGT in place Heart: regular, rate, and rhythm.  No obvious murmurs, gallops, or rubs noted.  Palpable pedal pulses bilaterally Lungs: mild bilateral expiratory wheezes, no rhonchi or rales noted.  Respiratory effort nonlabored Abd: multiple well healed incisions noted (R hemiabdomen, RLQ, lower transverse), soft, distended, hypoactive BS, no masses, hernias, or organomegaly.  MS: all 4 extremities are symmetrical with no cyanosis or clubbing. Calves soft and nontender Skin: warm and dry with no masses, lesions, or rashes Psych: unable to assess Neuro: unable to assess   Results for orders placed or performed during the hospital encounter of 11/18/17 (from the past 48  hour(s))  Glucose, capillary     Status: Abnormal   Collection Time: 11/20/17 12:09 PM  Result Value Ref Range   Glucose-Capillary 193 (H) 65 - 99 mg/dL   Comment 1 Notify RN    Comment  2 Document in Chart   Glucose, capillary     Status: Abnormal   Collection Time: 11/20/17  4:35 PM  Result Value Ref Range   Glucose-Capillary 137 (H) 65 - 99 mg/dL   Comment 1 Notify RN    Comment 2 Document in Chart   Glucose, capillary     Status: Abnormal   Collection Time: 11/20/17 10:19 PM  Result Value Ref Range   Glucose-Capillary 174 (H) 65 - 99 mg/dL   Comment 1 Notify RN   Basic metabolic panel     Status: Abnormal   Collection Time: 11/21/17  7:27 AM  Result Value Ref Range   Sodium 142 135 - 145 mmol/L   Potassium 4.8 3.5 - 5.1 mmol/L    Comment: SLIGHT HEMOLYSIS   Chloride 119 (H) 101 - 111 mmol/L   CO2 16 (L) 22 - 32 mmol/L   Glucose, Bld 89 65 - 99 mg/dL   BUN 14 6 - 20 mg/dL   Creatinine, Ser 1.22 (H) 0.44 - 1.00 mg/dL   Calcium 7.7 (L) 8.9 - 10.3 mg/dL   GFR calc non Af Amer 48 (L) >60 mL/min   GFR calc Af Amer 55 (L) >60 mL/min    Comment: (NOTE) The eGFR has been calculated using the CKD EPI equation. This calculation has not been validated in all clinical situations. eGFR's persistently <60 mL/min signify possible Chronic Kidney Disease.    Anion gap 7 5 - 15  Glucose, capillary     Status: Abnormal   Collection Time: 11/21/17  8:25 AM  Result Value Ref Range   Glucose-Capillary 133 (H) 65 - 99 mg/dL   Comment 1 Notify RN    Comment 2 Document in Chart   Glucose, capillary     Status: Abnormal   Collection Time: 11/21/17  1:18 PM  Result Value Ref Range   Glucose-Capillary 150 (H) 65 - 99 mg/dL   Comment 1 Notify RN    Comment 2 Document in Chart   Glucose, capillary     Status: Abnormal   Collection Time: 11/21/17  5:38 PM  Result Value Ref Range   Glucose-Capillary 133 (H) 65 - 99 mg/dL   Comment 1 Notify RN    Comment 2 Document in Chart   Glucose,  capillary     Status: Abnormal   Collection Time: 11/21/17  9:08 PM  Result Value Ref Range   Glucose-Capillary 104 (H) 65 - 99 mg/dL  Glucose, capillary     Status: Abnormal   Collection Time: 11/22/17  6:39 AM  Result Value Ref Range   Glucose-Capillary 64 (L) 65 - 99 mg/dL  Basic metabolic panel     Status: Abnormal   Collection Time: 11/22/17  7:48 AM  Result Value Ref Range   Sodium 139 135 - 145 mmol/L   Potassium 4.2 3.5 - 5.1 mmol/L   Chloride 117 (H) 101 - 111 mmol/L   CO2 15 (L) 22 - 32 mmol/L   Glucose, Bld 109 (H) 65 - 99 mg/dL   BUN 17 6 - 20 mg/dL   Creatinine, Ser 1.48 (H) 0.44 - 1.00 mg/dL   Calcium 7.9 (L) 8.9 - 10.3 mg/dL   GFR calc non Af Amer 38 (L) >60 mL/min   GFR calc Af Amer 44 (L) >60 mL/min    Comment: (NOTE) The eGFR has been calculated using the CKD EPI equation. This calculation has not been validated in all clinical situations. eGFR's persistently <60 mL/min signify possible Chronic Kidney  Disease.    Anion gap 7 5 - 15  Glucose, capillary     Status: None   Collection Time: 11/22/17  8:11 AM  Result Value Ref Range   Glucose-Capillary 98 65 - 99 mg/dL   Comment 1 Notify RN    Comment 2 Document in Chart    Ct Abdomen Pelvis Wo Contrast  Result Date: 11/22/2017 CLINICAL DATA:  Vomiting and generalized abdominal pain. No recent bowel movement. EXAM: CT ABDOMEN AND PELVIS WITHOUT CONTRAST TECHNIQUE: Multidetector CT imaging of the abdomen and pelvis was performed following the standard protocol without IV contrast. COMPARISON:  Radiography same day.  CT 10/20/2017. FINDINGS: Lower chest: Small effusions layering dependently with mild dependent atelectasis. Hepatobiliary: No abnormality of the liver parenchyma is seen. Previous cholecystectomy. Pancreas: Chronic pancreatic atrophy and calcification. No acute process. Spleen: Normal Adrenals/Urinary Tract: Adrenal glands are normal. Kidneys are normal. No cyst, mass, stone or hydronephrosis. No bladder  abnormality seen. Stomach/Bowel: Nasogastric tube in the stomach. The stomach is full of material. There is diffuse dilatation of the small bowel which is full of fairly homogeneous material. The distal ileum is collapsed. This is consistent with mid ileal small bowel obstruction. There is a moderate amount of fecal matter throughout the colon. Sigmoid diverticulosis without evidence of diverticulitis. Vascular/Lymphatic: Aortic atherosclerosis. IVC is flattened. No retroperitoneal adenopathy. Reproductive: No evidence of pelvic mass. Other: No free fluid or air.  Diffuse soft tissue edema Musculoskeletal: Previously seen compression fractures at T12, L1 and L3. IMPRESSION: High-grade small bowel obstruction in the mid ileum. Specific etiology not determined. Moderate to large amount of fecal matter throughout the colon. Nasogastric tube in the stomach. Stomach is full. No acute abdominal organ pathology. Diffuse soft tissue edema, worsened since the previous exam. Collapsed IVC suggesting low intravascular volume. Electronically Signed   By: Nelson Chimes M.D.   On: 11/22/2017 07:11   Dg Chest 2 View  Result Date: 11/21/2017 CLINICAL DATA:  62 year old female with shortness breath. Confused. Subsequent encounter. EXAM: CHEST  2 VIEW COMPARISON:  11/18/2017 chest x-ray.  10/20/2017 chest CT. FINDINGS: Cardiomegaly. Chronic lung changes with mild central pulmonary prominence without pulmonary edema or segmental consolidation. Remote right rib fractures. Prominent bilateral shoulder joint degenerative changes. Prior thoracic corpectomy and fusion with loss of height of the various thoracic and upper lumbar vertebra unchanged. Calcified aorta. Nonspecific bowel gas pattern. IMPRESSION: Cardiomegaly. Chronic lung changes without segmental consolidation or pulmonary edema. Aortic Atherosclerosis (ICD10-I70.0). Electronically Signed   By: Genia Del M.D.   On: 11/21/2017 11:07   Dg Abd Portable 2v  Result Date:  11/22/2017 CLINICAL DATA:  Acute distention of stomach and vomiting. EXAM: PORTABLE ABDOMEN - 2 VIEW COMPARISON:  Pelvis 11/18/2017.  CT abdomen and pelvis 10/20/2017 FINDINGS: Interval development of gaseous distention of mid abdominal small bowel with multiple air-fluid levels on the decubitus view. Findings are consistent with small bowel obstruction. Stool fills the colon. No free intra-abdominal air. No radiopaque stones. Surgical clips in the right upper quadrant. Postoperative changes in the thoracic spine. IMPRESSION: New development of small bowel gaseous distention and air-fluid levels consistent with small bowel obstruction. Electronically Signed   By: Lucienne Capers M.D.   On: 11/22/2017 03:51   Anti-infectives (From admission, onward)   Start     Dose/Rate Route Frequency Ordered Stop   11/19/17 2200  piperacillin-tazobactam (ZOSYN) IVPB 3.375 g  Status:  Discontinued     3.375 g 12.5 mL/hr over 240 Minutes Intravenous Every 8 hours 11/19/17  1904 11/21/17 1606   11/19/17 1915  vancomycin (VANCOCIN) 500 mg in sodium chloride 0.9 % 100 mL IVPB  Status:  Discontinued     500 mg 100 mL/hr over 60 Minutes Intravenous Every 12 hours 11/19/17 1904 11/21/17 1606   11/18/17 1730  piperacillin-tazobactam (ZOSYN) IVPB 3.375 g     3.375 g 100 mL/hr over 30 Minutes Intravenous  Once 11/18/17 1726 11/18/17 2018   11/18/17 1730  vancomycin (VANCOCIN) IVPB 1000 mg/200 mL premix     1,000 mg 200 mL/hr over 60 Minutes Intravenous  Once 11/18/17 1726 11/18/17 2018        Assessment/Plan Rheumatoid arthritis - on chronic steroids DM HTN Seizure disorder Chronic back pain H/o embolic CVA, Atrial fibrillation, large PFO - on eliquis (last dose 11/21/17)  SBO - prior abdominal surgeries per chart: appendectomy, cholecystectomy - CT scan shows high-grade small bowel obstruction in the mid ileum, moderate to large amount of fecal matter throughout the colon - NG tube placed this morning  ID -  zosyn/vancomycin 12/30>>1/2 VTE - hold eliquis FEN - IVF, NPO/NGT Foley - wick  Plan - Patient with SBO. Will start on small bowel protocol with gastrograffin study. Continue NG tube to LIWS. Hold eliquis. Limit narcotics. Will continue to follow.  Wellington Hampshire, [facility] Surgery 11/22/2017, 9:30 AM Pager: [phone] Consults: [phone] Mon-Fri 7:00 am-4:30 pm Sat-Sun 7:00 am-11:30 am

## 2017-11-22 NOTE — Progress Notes (Signed)
ANTICOAGULATION CONSULT NOTE - Initial Consult  Pharmacy Consult for heparin Indication: atrial fibrillation and VTE prophylaxis  Allergies  Allergen Reactions  . Ivp Dye [Iodinated Diagnostic Agents] Itching  . Metrizamide Itching    Patient Measurements: Height: 5' (152.4 cm) Weight: 123 lb 14.4 oz (56.2 kg) IBW/kg (Calculated) : 45.5 Heparin Dosing Weight: 56.2 Kg  Vital Signs: Temp: 98 F (36.7 C) (01/03 0801) Temp Source: Oral (01/03 0801) BP: 120/93 (01/03 0801) Pulse Rate: 103 (01/03 0801)  Labs: Recent Labs    11/20/17 0628 11/21/17 0727 11/22/17 0748  HGB  --   --  9.6*  HCT  --   --  30.1*  PLT  --   --  192  CREATININE 1.09* 1.22* 1.48*    Estimated Creatinine Clearance: 32.2 mL/min (A) (by C-G formula based on SCr of 1.48 mg/dL (H)).   Medical History: Past Medical History:  Diagnosis Date  . Arthritis   . Asthma   . Chronic pain   . Constipation   . Diabetes mellitus without complication (HCC)   . Embolic stroke (H[facility])   . Falls frequently   . GERD (gastroesophageal reflux disease)   . Hypertension   . Pneumonia 10/2015  . Seizures ([facility])    last seizure March 2015    Medications:  Medications Prior to Admission  Medication Sig Dispense Refill Last Dose  . apixaban (ELIQUIS) 5 MG TABS tablet Take 5 mg by mouth 2 (two) times daily.   09-27-17 at 60DS  . metFORMIN (GLUCOPHAGE-XR) 500 MG 24 hr tablet Take 500 mg by mouth daily.  3 10-17-17 at 30DS  . metoprolol succinate (TOPROL-XL) 25 MG 24 hr tablet Take 25 mg by mouth daily.   LF 09-24-17 at 90DS  . oxyCODONE (OXYCONTIN) 20 mg 12 hr tablet Take 20 mg by mouth 2 (two) times daily.   LF 10-17-17 at 28DS  . Oxycodone HCl 20 MG TABS Take 1 tablet by mouth 4 (four) times daily.  0 LF 11-16-17 at 28DS  . predniSONE (DELTASONE) 5 MG tablet Take 5 mg by mouth daily.  5 LF 11-07-17 at 30DS  . triamcinolone (KENALOG) 0.025 % cream Apply 1 application topically daily as needed (rash/itching).   0 LF  08-21-17 at 60DS  . valproic acid (DEPAKENE) 250 MG capsule Take 500 mg by mouth every 12 (twelve) hours.    LF 09-12-17 at 30DS  . albuterol (PROVENTIL HFA;VENTOLIN HFA) 108 (90 BASE) MCG/ACT inhaler Inhale 2 puffs into the lungs every 6 (six) hours as needed for wheezing or shortness of breath.   UNK at no fills  . allopurinol (ZYLOPRIM) 300 MG tablet Take 300 mg by mouth daily.  11 LF 07-13-17 at 90DS  . aspirin EC 325 MG EC tablet Take 1 tablet (325 mg total) by mouth daily. 30 tablet 0 UNK at otc  . atorvastatin (LIPITOR) 10 MG tablet Take 10 mg by mouth daily.  5 LF 07-11-17 at 90DS  . feeding supplement, ENSURE ENLIVE, (ENSURE ENLIVE) LIQD Take 237 mLs by mouth 2 (two) times daily between meals. (Patient taking differently: Take 237 mLs by mouth 2 (two) times daily between meals. strawberry) 60 Bottle 0 UNK at otc  . Fluticasone-Salmeterol (ADVAIR) 250-50 MCG/DOSE AEPB Inhale 2 puffs into the lungs as needed.   UNK at no fills  . folic acid (FOLVITE) 1 MG tablet Take 1 mg by mouth daily.   UNK at no fills  . gabapentin (NEURONTIN) 400 MG capsule Take 2 capsules (800  mg total) by mouth 3 (three) times daily. 90 capsule 0 UNK at no fills  . lacosamide 100 MG TABS Take 1 tablet (100 mg total) by mouth 2 (two) times daily. 60 tablet 2 UNK at no fills  . lactulose (CHRONULAC) 10 GM/15ML solution Take 30 mLs (20 g total) by mouth 2 (two) times daily as needed for severe constipation. 240 mL 0 UNK at no fills  . levETIRAcetam (KEPPRA) 500 MG tablet Take 500 mg by mouth 2 (two) times daily.   LF 07-16-17 at 60DS  . lidocaine (LIDODERM) 5 % Place 1 patch onto the skin daily. Remove & Discard patch within 12 hours or as directed by MD 30 patch 0 UNK at no fills  . loperamide (IMODIUM) 2 MG capsule Take 1 capsule (2 mg total) by mouth 4 (four) times daily as needed for diarrhea or loose stools. 12 capsule 0 UNK at otc  . potassium chloride 20 MEQ TBCR Take 40 mEq by mouth daily. (Patient taking differently:  Take 20 mEq by mouth 2 (two) times daily. ) 30 tablet 1 10/19/2017 at Unknown time  . torsemide (DEMADEX) 10 MG tablet Take 10 mg by mouth daily.   UNK at no fills  . Vitamin D, Ergocalciferol, (DRISDOL) 50000 UNITS CAPS capsule Take 1 capsule (50,000 Units total) by mouth every 7 (seven) days. Sunday (Patient taking differently: Take 50,000 Units by mouth every 30 (thirty) days. ) 30 capsule 0 LF 09-06-17 at 30DS    Assessment: Ms. Baumert is a 62yo female with PMH significant for embolic stroke  and atrial fibrillation on eliquis PTA. Last dose taken ~2200 last night. Found to have bowel obstruction and being transitioned to IV heparin. HgB 9.6 stable near baseline. No overt bleeding documented. Will use aPTT to monitor 2/2 recent Eliquis use.  Goal of Therapy:  Heparin level 0.3-0.7 units/ml aPTT 66-102 seconds Monitor platelets by anticoagulation protocol: Yes   Plan:  Start heparin infusion at 850 units/hr (~15 units/kg/hr) Check aPTT in 6 hours and daily while on heparin Continue to monitor H&H and platelets  Anniya Whiters L Elsy Chiang 11/22/2017,10:37 AM

## 2017-11-22 NOTE — Progress Notes (Signed)
ANTICOAGULATION CONSULT NOTE - FOLLOW UP    aPTT = 88 (goal 66-102 sec) Heparin dosing weight = 56 kg   Assessment: 61 YOF with history of Afib and embolic CVA to continue on IV heparin while Eliquis is on hold.  APTT therapeutic; no bleeding reported.   Plan: Continue heparin gtt at 850 units/hr F/U AM labs   Amber [name] D. Laney Potash, PharmD, BCPS 11/22/2017, 8:00 PM

## 2017-11-22 NOTE — Plan of Care (Signed)
Continue current care plan pt now has NGtube d/t sbo.

## 2017-11-22 NOTE — Progress Notes (Addendum)
Paged by nursing that patient had acute abdominal distention as well as 1 episode of projectile vomiting. Patient has not had a bowel movement in several days per nursing and patient. Vomit was chewed food, non-bloody, non-billious, no stool noted in vomit. On abdominal exam, patient very distended and TTP, with hypoactive bowel sounds in all 4 quadrants.   Plan: -Abdominal xray  -NPO    Addendum 4:18 AM  -NGT placement -STAT CT abdomen pelvis with contrast pending, may need surgery consult pending CT results

## 2017-11-22 NOTE — Progress Notes (Signed)
PT Cancellation Note  Patient Details Name: Amber [name] MRN: [id number] DOB: Jun 14, 1956   Cancelled Treatment:    Reason Eval/Treat Not Completed: (P) Pain limiting ability to participate(Pt refused OOB after feeling ill this morning s/p vomitting and placement of NG tube, will continue PT per POC if patient is willing.  )   Sissi Padia Artis Delay 11/22/2017, 1:46 PM  Joycelyn Rua, PTA pager [phone]

## 2017-11-22 NOTE — Progress Notes (Signed)
OT Cancellation Note  Patient Details Name: Amber [name] MRN: [id number] DOB: [birth year]   Cancelled Treatment:    Reason Eval/Treat Not Completed: Medical issues which prohibited therapy.  Will reattempt as medically appropriate.  Brooklyn Alfredo [place], OTR/L [phone]   Jeani Hawking M 11/22/2017, 11:32 AM

## 2017-11-22 NOTE — Progress Notes (Signed)
Medicine attending: We appreciate prompt surgical attention. I examined this patient today together with resident physician Dr. Rozann Lesches and I concur with her evaluation and management plan which we discussed together. Patient developed abdominal pain and distention during the night.  Evaluated by the night team.  Abdominal x-rays consistent with small bowel obstruction.  No free air.  Nasogastric tube has been placed. She is currently lethargic but arousable.  Abdomen is distended, tympanitic, nontender.  Absent bowel sounds. She had been constipated and has been on chronic narcotic analgesics.  These will be discontinued. Although this may just be simple obstruction from feces, CT findings of a focal point of obstruction in the mid ileum suggest that there could be other pathology such as a small bowel tumor or stricture.  We will ask surgery to evaluate and assist in management. Dr. Saunders Revel has called and updated the patient's family with her change in status. If anything, despite the new bowel obstruction her blood pressure has improved compared with previous values.

## 2017-11-22 NOTE — Progress Notes (Signed)
PT Cancellation Note  Patient Details Name: Amber [name] MRN: [id number] DOB: Mar 12, 1956   Cancelled Treatment:    Reason Eval/Treat Not Completed: (P) Medical issues which prohibited therapy(Pt declining medically per RN will defer tx today and f/u per POC and medical appropriateness.  )   [name] Artis Delay 11/22/2017, 3:24 PM  Joycelyn Rua, PTA pager [phone]

## 2017-11-22 NOTE — Progress Notes (Signed)
Subjective:  Patient observed laying in bed. More somnolent today and does not converse spontaneously with examiner. Easily awakens to sternal rub. States she has some pain in stomach and grimaces during exam.   Objective:  Vital signs in last 24 hours: Vitals:   11/22/17 0023 11/22/17 0316 11/22/17 0801 11/22/17 1103  BP: 122/90 (!) 118/97 (!) 120/93 90/76  Pulse: 85 82 (!) 103   Resp: 17  (!) 21 (!) 23  Temp: 97.9 F (36.6 C) 98.1 F (36.7 C) 98 F (36.7 C) 98.1 F (36.7 C)  TempSrc: Axillary Oral Oral Axillary  SpO2: 97% 100% 98%   Weight:      Height:       Physical Exam  Constitutional:  Patient laying in bed with NG tube in place in no acute distress. Patient was more difficult to arose this AM but did awaken and respond to sternal rub.  Cardiovascular: Normal rate, regular rhythm and intact distal pulses. Exam reveals no friction rub.  No murmur heard. Respiratory:  Patient speaking in full sentences on exam without accessory muscle use or nasal flaring. No signs of cyanosis. Bibasilar crackles appreciated. Intermittent wheezing with loud, transmitted upper airway sounds throughout.  GI:  Distended, tympanic abdomen with tenderness with palpation in all four quadrants.   Musculoskeletal: She exhibits edema (1+ pitting edema to knees bilaterally, unchanged from yesterday's exam). She exhibits no tenderness (1+ pitting edema to knees bilaterally).  Neurological:  Patient more somnolent but awakens to sternal rub. Gross movement of all 4 extremities intact bilaterally.  Skin: Skin is warm and dry. No rash noted. No erythema.   Assessment/Plan:  Ms. 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 She  was admitted to the internal medicine teaching service for management. The specific problems addressed during admission are as follows:  SBO: Patient developed projectile vomiting overnight and was found to have high grade, mid-ileal small bowel obstruction on imaging. Patient made NPO with NG tube placed, started on NS. Will make medications changes to IV rather than PO and limit sedating medications while NG tube in place since patient more lethargic on exam today. -Surgery consulted, recommendations appreciated -NPO -NGT in place, NS @ rate of 100 ml/hr -Toradol for pain control  Hypotension: Patient initially hypotensive on arrival and met SIRS criteria. She was started on broad spectrum antibiotics on admission and workup negative for infection. Antibiotics stopped 11/21/2016. BP improved with antibiotics and IVF. Patient's last BP 90/76. Previous BP over 24 hours ranging from 120s/90s.  -Continue to monitor  Seizure disorder: No clinical signs or symptoms of seizures. At home patient takes Keppra 500 mg BID and valproic acid '500mg'$  BID -Switch to IV keppra 500 mg BID and valproic acid 500 mg BID while NPO  DM2: Problem well controlled at baseline with Hemoglobin A1c 4.7 in 10/2016 (but patient has chronic anemia). BG appropriate in hospital while holding home metformin 500 mg daily and using SSI insulin with meals -CBG monitoring -Moderate SSI TID with meals and at bedtime  Hx of HTN: Holding home metoprolol '25mg'$  qday, torsemide '10mg'$  daily given above -Hold anti-hypertensives given hypotension  Chronic back pain, RA: Work-up from prior admission with no acute changes noted. Home regimen includes gabapentin 800 mg  TID,  oxycodone '20mg'$  QID PRN for severe pain with hold parameters, and tizanidine '4mg'$  QID PRN for muscle spasms -IV Toradol q6h PRN for pain -Transition daily prednisone 15 mg to IV solumedrol 10 mg while NPO -Holding home sedating medications 2/2 SBO as above  Hx of embolic CVA  in setting of A-Fib with TEE in 11/2016 that showed large PFO: Patient currently in NSR. Cardiology recommends follow up as outpatient for closure of PFO. On eliquis '5mg'$  BID for anti-coagulation.  -Switch to IV heparin today in case surgery involved  FEN/GI: -NPO -NS @ rate of 100 ml/hr while NPO -Protonix 40 mg IV while NPO  VTE Prophylaxis: Heparin per pharmacy Code Status: Full  Dispo: Anticipated discharge pending clinical improvement.  Thomasene Ripple, MD 11/22/2017, 11:43 AM Pager: Mamie Nick [phone]

## 2017-11-22 NOTE — Progress Notes (Signed)
Upon entering the patient's room she c/o abdominal pain. Per the chat pt last bowel movement was 11/20/2017, but per patient she hasn't have a bowel movement "in a while". Shortly after discussing this with the patient she began to projectile vomit. Her abdomen was distended with hypoactive bowel sounds in all 4 quadrants. IMTS paged, MD paged to beside and put in for a STAT xray of the abdomen. NGT placed following final results of Xray, set set to low-int. Suction. Patient now resting comfortably. Maintenance fluids started, awaiting STAT CT scan of abdomen. Will continue to monitor.

## 2017-11-23 DIAGNOSIS — M7989 Other specified soft tissue disorders: Secondary | ICD-10-CM

## 2017-11-23 DIAGNOSIS — D72829 Elevated white blood cell count, unspecified: Secondary | ICD-10-CM

## 2017-11-23 LAB — GLUCOSE, CAPILLARY
GLUCOSE-CAPILLARY: 79 mg/dL (ref 65–99)
GLUCOSE-CAPILLARY: 92 mg/dL (ref 65–99)
Glucose-Capillary: 77 mg/dL (ref 65–99)
Glucose-Capillary: 92 mg/dL (ref 65–99)

## 2017-11-23 LAB — BASIC METABOLIC PANEL
Anion gap: 5 (ref 5–15)
BUN: 12 mg/dL (ref 6–20)
CHLORIDE: 121 mmol/L — AB (ref 101–111)
CO2: 17 mmol/L — ABNORMAL LOW (ref 22–32)
Calcium: 7.5 mg/dL — ABNORMAL LOW (ref 8.9–10.3)
Creatinine, Ser: 0.99 mg/dL (ref 0.44–1.00)
GFR calc Af Amer: >60 mL/min (ref >60)
GFR calc non Af Amer: >60 mL/min (ref >60)
Glucose, Bld: 82 mg/dL (ref 65–99)
POTASSIUM: 3.5 mmol/L (ref 3.5–5.1)
SODIUM: 143 mmol/L (ref 135–145)

## 2017-11-23 LAB — CBC
HEMATOCRIT: 22.5 % — AB (ref 36.0–46.0)
HEMATOCRIT: 22.5 % — AB (ref 36.0–46.0)
Hemoglobin: 7.2 g/dL — ABNORMAL LOW (ref 12.0–15.0)
Hemoglobin: 7.2 g/dL — ABNORMAL LOW (ref 12.0–15.0)
MCH: 23.1 pg — ABNORMAL LOW (ref 26.0–34.0)
MCH: 23.2 pg — AB (ref 26.0–34.0)
MCHC: 32 g/dL (ref 30.0–36.0)
MCHC: 32 g/dL (ref 30.0–36.0)
MCV: 72.1 fL — AB (ref 78.0–100.0)
MCV: 72.3 fL — ABNORMAL LOW (ref 78.0–100.0)
Platelets: 151 10*3/uL (ref 150–400)
Platelets: 142 10*3/uL — ABNORMAL LOW (ref 150–400)
RBC: 3.12 MIL/uL — AB (ref 3.87–5.11)
RBC: 3.11 MIL/uL — AB (ref 3.87–5.11)
RDW: 19 % — AB (ref 11.5–15.5)
RDW: 19 % — ABNORMAL HIGH (ref 11.5–15.5)
WBC: 16.7 10*3/uL — AB (ref 4.0–10.5)
WBC: 14.8 10*3/uL — ABNORMAL HIGH (ref 4.0–10.5)

## 2017-11-23 LAB — HEPATIC FUNCTION PANEL
ALBUMIN: 1.7 g/dL — AB (ref 3.5–5.0)
ALT: 12 U/L — ABNORMAL LOW (ref 14–54)
AST: 19 U/L (ref 15–41)
Alkaline Phosphatase: 63 U/L (ref 38–126)
TOTAL PROTEIN: 4.3 g/dL — AB (ref 6.5–8.1)
Total Bilirubin: 0.6 mg/dL (ref 0.3–1.2)

## 2017-11-23 LAB — CULTURE, BLOOD (ROUTINE X 2)
CULTURE: NO GROWTH
Culture: NO GROWTH
SPECIAL REQUESTS: ADEQUATE
Special Requests: ADEQUATE

## 2017-11-23 LAB — APTT: aPTT: 136 seconds — ABNORMAL HIGH (ref 24–36)

## 2017-11-23 LAB — PREPARE RBC (CROSSMATCH)

## 2017-11-23 LAB — HEPARIN LEVEL (UNFRACTIONATED): Heparin Unfractionated: 0.92 IU/mL — ABNORMAL HIGH (ref 0.30–0.70)

## 2017-11-23 MED ORDER — KCL IN DEXTROSE-NACL 10-5-0.45 MEQ/L-%-% IV SOLN
INTRAVENOUS | Status: DC
  Administered 2017-11-23: 12:00:00 via INTRAVENOUS
  Administered 2017-11-24: 1 mL via INTRAVENOUS
  Filled 2017-11-23 (×3): qty 1000

## 2017-11-23 MED ORDER — SODIUM CHLORIDE 0.9 % IV SOLN
Freq: Once | INTRAVENOUS | Status: AC
  Administered 2017-11-23: 14:00:00 via INTRAVENOUS

## 2017-11-23 MED ORDER — KETOROLAC TROMETHAMINE 15 MG/ML IJ SOLN
15.0000 mg | Freq: Once | INTRAMUSCULAR | Status: AC
  Administered 2017-11-23: 15 mg via INTRAVENOUS
  Filled 2017-11-23: qty 1

## 2017-11-23 MED ORDER — ENOXAPARIN SODIUM 80 MG/0.8ML ~~LOC~~ SOLN
1.0000 mg/kg | Freq: Two times a day (BID) | SUBCUTANEOUS | Status: DC
  Administered 2017-11-23 – 2017-11-24 (×2): 65 mg via SUBCUTANEOUS
  Filled 2017-11-23 (×2): qty 0.8

## 2017-11-23 NOTE — Plan of Care (Signed)
Will continue current care plan NGT in situ,

## 2017-11-23 NOTE — Progress Notes (Signed)
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  Pt was started on antibiotics and given fluids, and showed improvement.  No clear source of infection identified.  On 11/22/16, she develeoped projectile vomiting and CT of abdomen showed SBO.  NG tube placed.   PT Comments    Pt required max encouragement and education for participation with therapies today. Finally agreeable to sit EOB, requiring modA+2 to sit. Min guard-modA for sitting balance secondary to pt easily fatiguing and decreased core control. Edema noted in all four extremities; ROM practiced and encouraged. Continue to recommend SNF-level therapies. Will continue to follow acutely.   Follow Up Recommendations  SNF;Supervision/Assistance - 24 hour     Equipment Recommendations       Recommendations for Other Services       Precautions / Restrictions Precautions Precautions: Fall Restrictions Weight Bearing Restrictions: No    Mobility  Bed Mobility Overal bed mobility: Needs Assistance Bed Mobility: Supine to Sit;Sit to Supine     Supine to sit: Mod assist;+2 for physical assistance Sit to supine: Mod assist;+2 for physical assistance   General bed mobility comments: assist wtih moving LEs off bed and with lifitng trunk   Transfers                 General transfer comment: Pt agreeable only to EOB sitting   Ambulation/Gait                 Stairs            Wheelchair Mobility    Modified Rankin (Stroke Patients Only)       Balance Overall balance assessment: Needs assistance;History of Falls Sitting-balance support: Feet  supported Sitting balance-Leahy Scale: Poor Sitting balance - Comments: Pt requires min guard to mod A to sit EOB.  As she fatigued, she demonstrated a heavy posterior lean.  She sat EOB ~13 mins  Postural control: Posterior lean                                  Cognition Arousal/Alertness: Awake/alert Behavior During Therapy: WFL for tasks assessed/performed Overall Cognitive Status: No family/caregiver present to determine baseline cognitive functioning                                 General Comments: Pt demonstrates decreased judgement       Exercises      General Comments General comments (skin integrity, edema, etc.): HR to 121 with EOB sitting       Pertinent Vitals/Pain Pain Assessment: No/denies pain Pain Intervention(s): Monitored during session    Home Living Family/patient expects to be discharged to:: Unsure Living Arrangements: Other relatives Available Help at Discharge: Family;Available PRN/intermittently Type of Home: Apartment Home Access: Stairs to enter   Home Layout: One level Home Equipment: Tub bench;Walker - 4 wheels;Bedside commode Additional Comments: Pt reports she lives with step daughter, who assists her with ADLs.      Prior Function Level of Independence: Needs assistance  Gait / Transfers Assistance Needed: pt ambulates  with rollator for household distance only but has frequent falls ADL's / Homemaking Assistance Needed: Pt reports step daughter assists with ADLs  Comments: Pt reports she does not leave her home often    PT Goals (current goals can now be found in the care plan section) Acute Rehab PT Goals Patient Stated Goal: return home PT Goal Formulation: With patient Time For Goal Achievement: 12/04/17 Potential to Achieve Goals: Fair Progress towards PT goals: Not progressing toward goals - comment(Only willing to sit EOB)    Frequency    Min 2X/week      PT Plan Discharge plan needs to be  updated    Co-evaluation PT/OT/SLP Co-Evaluation/Treatment: Yes Reason for Co-Treatment: Complexity of the patient's impairments (multi-system involvement);For patient/therapist safety PT goals addressed during session: Mobility/safety with mobility OT goals addressed during session: Strengthening/ROM      AM-PAC PT "6 Clicks" Daily Activity  Outcome Measure  Difficulty turning over in bed (including adjusting bedclothes, sheets and blankets)?: Unable Difficulty moving from lying on back to sitting on the side of the bed? : Unable Difficulty sitting down on and standing up from a chair with arms (e.g., wheelchair, bedside commode, etc,.)?: Unable Help needed moving to and from a bed to chair (including a wheelchair)?: A Lot Help needed walking in hospital room?: A Lot Help needed climbing 3-5 steps with a railing? : Total 6 Click Score: 8    End of Session   Activity Tolerance: Patient limited by fatigue Patient left: in bed;with call bell/phone within reach Nurse Communication: Mobility status PT Visit Diagnosis: Unsteadiness on feet (R26.81);Other abnormalities of gait and mobility (R26.89);Muscle weakness (generalized) (M62.81);History of falling (Z91.81)     Time: 9024-0973 PT Time Calculation (min) (ACUTE ONLY): 23 min  Charges:  $Therapeutic Activity: 8-22 mins                    G Codes:      Ina Homes, PT, DPT Acute Rehab Services  Pager: [phone]  Malachy Chamber 11/23/2017, 12:53 PM

## 2017-11-23 NOTE — Progress Notes (Signed)
Paged by nursing for concern of b/l LE swelling and left arm swelling which appeared worse later in shift - concern about fluid overload. Went to bedside to evaluate patient. Dorsum of left hand swollen, no warmth or erythema, skin intact, cap refill 2+. Right dorsum of hand also swollen, but less than the left. Bilateral LE swelling stable from prior exam. Patient appears stable.   Plan: -Continue maintenance fluids at current rate of 100 cc/hr  Juluis Mire, MD Internal Medicine PGY1

## 2017-11-23 NOTE — Progress Notes (Signed)
Pt having increased peripheral edema in left hand. Pt hand is more edematous than beginning of shift. RN elevated left arm on two pillows. MD paged concerning IVF rate of 152ml/hr. MD will be up to see pt. Will continue to monitor.

## 2017-11-23 NOTE — Progress Notes (Signed)
ANTICOAGULATION CONSULT NOTE - Follow Up Consult  Pharmacy Consult for Heparin (Apixaban on hold) Indication: atrial fibrillation and Hx CVA  Allergies  Allergen Reactions  . Ivp Dye [Iodinated Diagnostic Agents] Itching  . Metrizamide Itching    Patient Measurements: Height: 5' (152.4 cm) Weight: 123 lb 14.4 oz (56.2 kg) IBW/kg (Calculated) : 45.5  Vital Signs: Temp: 98.3 F (36.8 C) (01/04 0325) Temp Source: Oral (01/04 0325) BP: 119/83 (01/04 0325) Pulse Rate: 102 (01/04 0325)  Labs: Recent Labs    11/21/17 0727 11/22/17 0748 11/22/17 1813 11/23/17 0312  HGB  --  9.6*  --  7.2*  HCT  --  30.1*  --  22.5*  PLT  --  192  --  151  APTT  --   --  88* 136*  HEPARINUNFRC  --   --   --  0.92*  CREATININE 1.22* 1.48*  --  0.99    Estimated Creatinine Clearance: 48.1 mL/min (by C-G formula based on SCr of 0.99 mg/dL).   Assessment: On apixaban PTA for afib/hx CVA, on heparin while apixaban is on hold, aPTT this AM is supra-therapeutic at 136, currently using aPTT to dose given apixaban influence on anti-Xa levels, although here anti-xa and aPTT appear fairly close to correlating. Noted drop in Hgb, pt is being fluid resuscitated at 100 mL/hr, no bleeding issues per RN.   Goal of Therapy:  Heparin level 0.3-0.7 units/ml aPTT 66-102 seconds Monitor platelets by anticoagulation protocol: Yes   Plan:  Dec heparin to 700 units/hr 1300 aPTT/HL Should be able to starting using heparin level only tomorrow   Abran Duke 11/23/2017,5:08 AM

## 2017-11-23 NOTE — Progress Notes (Signed)
Medicine attending: I examined this patient today together with resident physician Dr. Rozann Lesches and I concur with her evaluation and management plan which we discussed together. We appreciate ongoing follow-up from general surgery. Abdomen successfully decompressed with NG suction.  Patient did have a bowel movement following Gastrografin.  Abdomen is now soft, nontender, active bowel sounds.  Per surgery continue NG suction intermittently for now.  Anticipate further progress and removal of the tube tomorrow.  Unexplained sudden fall in hemoglobin but looking at the values prior to the sudden change, hemoglobins were all running in the 7.5-8.8 range.  There are no signs of gross bleeding.  No tachycardia.  Blood pressure better than recent baseline.  NG output appears to be all gastric juice.  I think what may have happened was transient hemoconcentration at time of the bowel obstruction.  In any event, we will give her a transfusion.  Holding heparin today to make sure she is not actively bleeding.  We will need to resume anticoagulation soon if she is otherwise stable.  White count up concomitant with parenteral steroid dose.  She remains afebrile.  Initial survey blood and urine cultures are negative.  She does appear to be fluid overloaded.  She already had 2+ peripheral edema and is now developing edema of her upper extremities and the dorsa of her hands.  We will cut back on her IV fluids.  As needed diuretics.

## 2017-11-23 NOTE — Progress Notes (Signed)
Subjective:  Patient observed sleeping in bed with NG tube in place. Patient easily awoken and intermittently moaning in pain, patient reports pain mainly occurs in belly. No other acute complaints.  Objective:  Vital signs in last 24 hours: Vitals:   11/22/17 2000 11/22/17 2237 11/22/17 2335 11/23/17 0325  BP: 123/82  95/70 119/83  Pulse: (!) 107  92 (!) 102  Resp: (!) 21  17 20   Temp:   98.4 F (36.9 C) 98.3 F (36.8 C)  TempSrc:   Axillary Oral  SpO2: 99% 100% 98% 99%  Weight:      Height:       Physical Exam  Constitutional:  Patient laying in bed with NG tube in place in no acute distress. Patient was more difficult to arose this AM but did awaken and respond to sternal rub.  Cardiovascular: Normal rate, regular rhythm and intact distal pulses. Exam reveals no friction rub.  No murmur heard. Respiratory:  Poor effort during exam. No crackles or wheezing appreciated in anterior lung fields.  GI:  Interval improvement in abdominal distension and tympany. The patient grimaces with palpation of all four abdominal quadrants.  Musculoskeletal: She exhibits edema (2+ pitting edema to knees bilaterally, interval worsening in feet and lower extremities from yesterday's exam. Trace pitting edema also observed  in bilateral upper extremities L>R.). She exhibits no tenderness (1+ pitting edema to knees bilaterally).  Neurological:  Patient awakens to voice. Gross movement of all 4 extremities intact bilaterally.  Skin: Skin is warm and dry. No rash noted. No erythema.   Assessment/Plan:  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 She was admitted to the internal medicine teaching service for management.  The specific problems addressed during admission are as follows:  SBO: Interval improvement in abdominal exam from yesterday. Imaging overnight showed partial obstruction and nurse reports decreased NG output since yesterday. Overall improving. Will continue NG tube today, per surgery recommendations. Likely clamping trial and removal tomorrow if she continues to improve. -Surgery consulted, recommendations appreciated -NGT to low intermittent suction today -NPO while NGT in place, D51/2NS + K 10 mEq @ rate of 50 ml/hr given peripheral edema on exam -PRN Toradol for pain control  Acute on chronic anemia: Patient's Hgb dropped from 9.6 to 7.2 overnight. No obvious source of bleeding. Per nursing reports patient does not have blood in bowel movements, but did have hematemsis yesterday. NG output does not grossly look bloody today. Will continue to monitor after transfusion of 1 unit pRBCs.  -D/C heparin -Repeat CBC 7.2 this AM -Type and screen, transfuse 1 unit -CBC tomorrow AM  Leukocytosis: Noticed on AM labs. Maybe reactionary (with IV solumedrol contributing). Will continue to monitor for signs and symptoms of infection, with low threshold to repeat chest imaging and start Unasyn for aspiration pneumonia given n/v in setting of SBO. -CBC tomorrow AM  Hypotension: Admitted with hypotension which resolved with fluids, antibiotics, and holding home antihypertensive medications. Previous BP over 24 hours ranging from 120s/90s.  -Continue to monitor  Seizure disorder: No clinical signs or symptoms of seizures. At home patient takes Keppra 500 mg BID and valproic acid 500mg  BID. -Switch to IV keppra 500 mg BID and valproic acid 500 mg BID  while NPO  DM2: Problem well controlled at baseline with Hemoglobin A1c 4.7 in 10/2016 (but patient has chronic anemia).  -CBG monitoring -Moderate SSI TID when patient not NPO  Hx of HTN: Holding home metoprolol 25mg  qday, torsemide 10mg  daily given  above -Hold anti-hypertensives given hypotension  Chronic back pain, RA: Home regimen includes gabapentin 800 mg TID,  oxycodone 20mg  QID PRN for severe pain with hold parameters, and tizanidine 4mg  QID PRN for muscle spasms. Currently holding all opioid medications as these likely contributed to SBO. Will need to slow restart with aggressive bowel regimen once stable. -IV Toradol q6h PRN for pain -Transition daily prednisone 15 mg to IV solumedrol 10 mg while NPO  Hx of embolic CVA in setting of A-Fib with TEE in 11/2016 that showed large PFO: Patient currently in NSR. Cardiology recommends follow up as outpatient for closure of PFO. On eliquis 5mg  BID for anti-coagulation.  -Hold anticoagulation in setting of acute on chronic anemia as above  FEN/GI: -NPO -D5 1/2 NS + K 10 mEq @ rate of 50 ml/hr while NPO -Protonix 40 mg IV while NPO  VTE Prophylaxis: SCD's  Code Status: Full  Dispo: Anticipated discharge pending clinical improvement.  , MD 11/23/2017, 6:45 AM

## 2017-11-23 NOTE — Care Management Note (Signed)
Case Management Note  Patient Details  Name: Amber [name] MRN: [id number] Date of Birth: 31-Jan-1956  Subjective/Objective:    Pt admitted with SBO                Action/Plan:  PTA from home, SNF recommended -CSW following.  CM will continue to follow for discharge needs   Expected Discharge Date:                  Expected Discharge Plan:  Skilled Nursing Facility  In-House Referral:  Clinical Social Work  Discharge planning Services  CM Consult  Post Acute Care Choice:    Choice offered to:     DME Arranged:    DME Agency:     HH Arranged:    HH Agency:     Status of Service:     If discussed at [employer], dates discussed:    Additional Comments:  Cherylann Parr, RN 11/23/2017, 9:09 AM

## 2017-11-23 NOTE — Progress Notes (Signed)
Subjective/Chief Complaint: Had 4 BMs, claims she feels less distended   Objective: Vital signs in last 24 hours: Temp:  [98 F (36.7 C)-98.8 F (37.1 C)] 98.3 F (36.8 C) (01/04 0325) Pulse Rate:  [92-118] 102 (01/04 0325) Resp:  [17-33] 20 (01/04 0325) BP: (90-134)/(70-96) 119/83 (01/04 0325) SpO2:  [95 %-100 %] 99 % (01/04 0325) Last BM Date: 11/22/17  Intake/Output from previous day: 01/03 0701 - 01/04 0700 In: 2694.1 [I.V.:2374.1; IV Piggyback:320] Out: 1600 [Urine:1200; Emesis/NG output:400] Intake/Output this shift: No intake/output data recorded.  General appearance: cooperative Resp: clear to auscultation bilaterally Cardio: regular rate and rhythm GI: much softer, NT, +BS  Lab Results:  Recent Labs    11/22/17 0748 11/23/17 0312  WBC 9.4 16.7*  HGB 9.6* 7.2*  HCT 30.1* 22.5*  PLT 192 151   BMET Recent Labs    11/22/17 0748 11/23/17 0312  NA 139 143  K 4.2 3.5  CL 117* 121*  CO2 15* 17*  GLUCOSE 109* 82  BUN 17 12  CREATININE 1.48* 0.99  CALCIUM 7.9* 7.5*   PT/INR No results for input(s): LABPROT, INR in the last 72 hours. ABG No results for input(s): PHART, HCO3 in the last 72 hours.  Invalid input(s): PCO2, PO2  Studies/Results: Ct Abdomen Pelvis Wo Contrast  Result Date: 11/22/2017 CLINICAL DATA:  Vomiting and generalized abdominal pain. No recent bowel movement. EXAM: CT ABDOMEN AND PELVIS WITHOUT CONTRAST TECHNIQUE: Multidetector CT imaging of the abdomen and pelvis was performed following the standard protocol without IV contrast. COMPARISON:  Radiography same day.  CT 10/20/2017. FINDINGS: Lower chest: Small effusions layering dependently with mild dependent atelectasis. Hepatobiliary: No abnormality of the liver parenchyma is seen. Previous cholecystectomy. Pancreas: Chronic pancreatic atrophy and calcification. No acute process. Spleen: Normal Adrenals/Urinary Tract: Adrenal glands are normal. Kidneys are normal. No cyst, mass,  stone or hydronephrosis. No bladder abnormality seen. Stomach/Bowel: Nasogastric tube in the stomach. The stomach is full of material. There is diffuse dilatation of the small bowel which is full of fairly homogeneous material. The distal ileum is collapsed. This is consistent with mid ileal small bowel obstruction. There is a moderate amount of fecal matter throughout the colon. Sigmoid diverticulosis without evidence of diverticulitis. Vascular/Lymphatic: Aortic atherosclerosis. IVC is flattened. No retroperitoneal adenopathy. Reproductive: No evidence of pelvic mass. Other: No free fluid or air.  Diffuse soft tissue edema Musculoskeletal: Previously seen compression fractures at T12, L1 and L3. IMPRESSION: High-grade small bowel obstruction in the mid ileum. Specific etiology not determined. Moderate to large amount of fecal matter throughout the colon. Nasogastric tube in the stomach. Stomach is full. No acute abdominal organ pathology. Diffuse soft tissue edema, worsened since the previous exam. Collapsed IVC suggesting low intravascular volume. Electronically Signed   By: Paulina Fusi M.D.   On: 11/22/2017 07:11   Dg Chest 2 View  Result Date: 11/21/2017 CLINICAL DATA:  62 year old female with shortness breath. Confused. Subsequent encounter. EXAM: CHEST  2 VIEW COMPARISON:  11/18/2017 chest x-ray.  10/20/2017 chest CT. FINDINGS: Cardiomegaly. Chronic lung changes with mild central pulmonary prominence without pulmonary edema or segmental consolidation. Remote right rib fractures. Prominent bilateral shoulder joint degenerative changes. Prior thoracic corpectomy and fusion with loss of height of the various thoracic and upper lumbar vertebra unchanged. Calcified aorta. Nonspecific bowel gas pattern. IMPRESSION: Cardiomegaly. Chronic lung changes without segmental consolidation or pulmonary edema. Aortic Atherosclerosis (ICD10-I70.0). Electronically Signed   By: Lacy Duverney M.D.   On: 11/21/2017 11:07  Dg Chest Port 1 View  Result Date: 11/22/2017 CLINICAL DATA:  Aspiration. EXAM: PORTABLE CHEST 1 VIEW COMPARISON:  11/21/2017.  10/27/2016 . FINDINGS: NG tube noted coiled in the stomach. Cardiomegaly with normal pulmonary vascularity. Diffuse bilateral interstitial prominence noted. These changes are unchanged and consistent chronic interstitial lung disease. No pleural effusion or pneumothorax. Degenerative changes thoracic spine . Cervicothoracic spine fusion with hardware intact. Old right rib fractures are present. IMPRESSION: 1. NG tube noted coiled stomach. 2. Diffuse bilateral pulmonary interstitial prominence noted. Similar findings noted on prior exams. Changes consistent with chronic interstitial lung disease. Electronically Signed   By: Maisie Fus  Register   On: 11/22/2017 16:08   Dg Abd Portable 1v-small Bowel Obstruction Protocol-initial, 8 Hr Delay  Result Date: 11/22/2017 CLINICAL DATA:  Small bowel obstruction. EXAM: PORTABLE ABDOMEN - 1 VIEW COMPARISON:  11/22/2017 FINDINGS: Dilated gas-filled mid abdominal small bowel consistent with obstruction. Dilute contrast material is demonstrated in the colon suggesting that this represents partial obstruction. Enteric tube with tip in the right upper quadrant consistent with location in the distal stomach. Surgical clips in the right upper quadrant. IMPRESSION: Gaseous distention of small bowel consistent with obstruction. Contrast material in the colon suggesting partial obstruction. Enteric tube tip in the right upper quadrant consistent with location in the distal stomach. Electronically Signed   By: Burman Nieves M.D.   On: 11/22/2017 22:08   Dg Abd Portable 2v  Result Date: 11/22/2017 CLINICAL DATA:  Acute distention of stomach and vomiting. EXAM: PORTABLE ABDOMEN - 2 VIEW COMPARISON:  Pelvis 11/18/2017.  CT abdomen and pelvis 10/20/2017 FINDINGS: Interval development of gaseous distention of mid abdominal small bowel with multiple  air-fluid levels on the decubitus view. Findings are consistent with small bowel obstruction. Stool fills the colon. No free intra-abdominal air. No radiopaque stones. Surgical clips in the right upper quadrant. Postoperative changes in the thoracic spine. IMPRESSION: New development of small bowel gaseous distention and air-fluid levels consistent with small bowel obstruction. Electronically Signed   By: Burman Nieves M.D.   On: 11/22/2017 03:51    Anti-infectives: Anti-infectives (From admission, onward)   Start     Dose/Rate Route Frequency Ordered Stop   11/19/17 2200  piperacillin-tazobactam (ZOSYN) IVPB 3.375 g  Status:  Discontinued     3.375 g 12.5 mL/hr over 240 Minutes Intravenous Every 8 hours 11/19/17 1904 11/21/17 1606   11/19/17 1915  vancomycin (VANCOCIN) 500 mg in sodium chloride 0.9 % 100 mL IVPB  Status:  Discontinued     500 mg 100 mL/hr over 60 Minutes Intravenous Every 12 hours 11/19/17 1904 11/21/17 1606   11/18/17 1730  piperacillin-tazobactam (ZOSYN) IVPB 3.375 g     3.375 g 100 mL/hr over 30 Minutes Intravenous  Once 11/18/17 1726 11/18/17 2018   11/18/17 1730  vancomycin (VANCOCIN) IVPB 1000 mg/200 mL premix     1,000 mg 200 mL/hr over 60 Minutes Intravenous  Once 11/18/17 1726 11/18/17 2018      Assessment/Plan: SBO - improving, contrast in colon and had 4 BMs. Still had sme dilated SB on x-ray so will continue NGT today with hope to remove it tomorrow if she continues to improve.  Stool yesterday was clay colored so will check LFTs. She has had a cholecystectomy previously.  LOS: 5 days    Liz Malady 11/23/2017

## 2017-11-23 NOTE — Evaluation (Signed)
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  Pt was started on antibiotics and given fluids, and showed improvement.  No clear source of infection identified.  On 11/22/16, she develeoped projectile vomiting and CT of abdomen showed SBO.  NG tube placed.   Clinical Impression   Pt admitted with above. She demonstrates the below listed deficits and will benefit from continued OT to maximize safety and independence with BADLs.  Pt presents to OT with generalized weakness, decreased activity tolerance, decreased balance, decreased safety and judgement.  She currently requires mod - total A for ADLs.  She was only agreeable to EOB activity this date due to fatigue.  PTA, she lived with step daughter, who she reports assisted her with ADLs, and she was ambulatory, household only, using Rollator and mod I to supervision.  Anticipate she will need SNF level rehab at discharge.       Follow Up Recommendations  SNF;Supervision/Assistance - 24 hour    Equipment Recommendations  3 in 1 bedside commode;None recommended by OT    Recommendations for Other Services       Precautions / Restrictions        Mobility Bed Mobility Overal bed mobility: Needs Assistance Bed Mobility: Supine to Sit;Sit to Supine     Supine to sit: Mod assist;+2 for physical assistance Sit to supine: Mod assist;+2 for physical assistance   General bed mobility comments: assist wtih moving LEs off bed and with lifitng trunk   Transfers                 General transfer comment: Pt agreeable only to EOB sitting     Balance Overall balance  assessment: Needs assistance;History of Falls Sitting-balance support: Feet supported Sitting balance-Leahy Scale: Poor Sitting balance - Comments: Pt requires min guard to mod A to sit EOB.  As she fatigued, she demonstrated a heavy posterior lean.  She sat EOB ~13 mins                                    ADL either performed or assessed with clinical judgement   ADL Overall ADL's : Needs assistance/impaired Eating/Feeding: NPO   Grooming: Wash/dry hands;Wash/dry face;Oral care;Moderate assistance;Sitting   Upper Body Bathing: Maximal assistance;Sitting   Lower Body Bathing: Maximal assistance;Bed level   Upper Body Dressing : Maximal assistance;Sitting   Lower Body Dressing: Total assistance;Bed level   Toilet Transfer: Total assistance Toilet Transfer Details (indicate cue type and reason): Pt unable to attempt this date due to fatigue and weakness  Toileting- Clothing Manipulation and Hygiene: Total assistance;Bed level       Functional mobility during ADLs: Moderate assistance;+2 for physical assistance(bed mobility only )       Vision         Perception     Praxis      Pertinent Vitals/Pain Pain Assessment: No/denies pain     Hand Dominance Right   Extremity/Trunk Assessment Upper Extremity Assessment Upper Extremity Assessment: Generalized weakness   Lower Extremity Assessment Lower Extremity Assessment: Defer to PT evaluation   Cervical / Trunk Assessment  Cervical / Trunk Assessment: Kyphotic;Other exceptions(Poor trunk control )   Communication Communication Communication: No difficulties   Cognition Arousal/Alertness: Awake/alert Behavior During Therapy: WFL for tasks assessed/performed Overall Cognitive Status: No family/caregiver present to determine baseline cognitive functioning                                 General Comments: Pt demonstrates decreased judgement    General Comments  HR to 121 with EOB sitting      Exercises     Shoulder Instructions      Home Living Family/patient expects to be discharged to:: Unsure Living Arrangements: Other relatives Available Help at Discharge: Family;Available PRN/intermittently Type of Home: Apartment Home Access: Stairs to enter Entrance Stairs-Number of Steps: 5   Home Layout: One level     Bathroom Shower/Tub: Tub/shower unit;Curtain   Firefighter: Standard     Home Equipment: Tub bench;Walker - 4 wheels;Bedside commode   Additional Comments: Pt reports she lives with step daughter, who assists her with ADLs.        Prior Functioning/Environment Level of Independence: Needs assistance  Gait / Transfers Assistance Needed: pt ambulates with rollator for household distance only but has frequent falls ADL's / Homemaking Assistance Needed: Pt reports step daughter assists with ADLs    Comments: Pt reports she does not leave her home often         OT Problem List:        OT Treatment/Interventions: Self-care/ADL training;Therapeutic activities;Cognitive remediation/compensation;Patient/family education;Balance training;Therapeutic exercise;DME and/or AE instruction;Energy conservation    OT Goals(Current goals can be found in the care plan section) Acute Rehab OT Goals Patient Stated Goal: return home OT Goal Formulation: With patient Time For Goal Achievement: 12/07/17 Potential to Achieve Goals: Good ADL Goals Pt Will Perform Grooming: with supervision;sitting Pt Will Perform Upper Body Bathing: with min assist;sitting Pt Will Perform Lower Body Bathing: with mod assist;sit to/from stand Pt Will Perform Upper Body Dressing: with min assist;sitting Pt Will Perform Lower Body Dressing: with mod assist;sit to/from stand Pt Will Transfer to Toilet: with min assist;ambulating;regular height toilet;grab bars;bedside commode Pt Will Perform Toileting - Clothing Manipulation and hygiene: with min assist;sit to/from  stand Pt/caregiver will Perform Home Exercise Program: Increased strength;Right Upper extremity;Left upper extremity;With theraband;With Supervision;With written HEP provided  OT Frequency: Min 2X/week   Barriers to D/C: Decreased caregiver support  unsure that family can provide necessary level of assist        Co-evaluation PT/OT/SLP Co-Evaluation/Treatment: Yes Reason for Co-Treatment: Complexity of the patient's impairments (multi-system involvement);For patient/therapist safety   OT goals addressed during session: Strengthening/ROM      AM-PAC PT "6 Clicks" Daily Activity     Outcome Measure Help from another person eating meals?: Total Help from another person taking care of personal grooming?: A Lot Help from another person toileting, which includes using toliet, bedpan, or urinal?: Total Help from another person bathing (including washing, rinsing, drying)?: A Lot Help from another person to put on and taking off regular upper body clothing?: A Lot Help from another person to put on and taking off regular lower body clothing?: Total 6 Click Score: 9   End of Session Nurse Communication: Mobility status  Activity Tolerance: Patient limited by fatigue Patient left: in bed;with call bell/phone within reach  OT Visit Diagnosis: Muscle weakness (generalized) (M62.81)                Time:  4196-2229 OT Time Calculation (min): 21 min Charges:  OT General Charges $OT Visit: 1 Visit OT Evaluation $OT Eval Moderate Complexity: 1 Mod G-Codes:     [employer], OTR/L [phone]   Jeani Hawking M 11/23/2017, 12:45 PM

## 2017-11-23 NOTE — Progress Notes (Signed)
ANTICOAGULATION CONSULT NOTE - Follow Up Consult  Pharmacy Consult for lovenox(Apixaban on hold) Indication: atrial fibrillation and Hx CVA  Allergies  Allergen Reactions  . Ivp Dye [Iodinated Diagnostic Agents] Itching  . Metrizamide Itching    Patient Measurements: Height: 5' (152.4 cm) Weight: 144 lb 6.4 oz (65.5 kg) IBW/kg (Calculated) : 45.5  Vital Signs: Temp: 98.8 F (37.1 C) (01/04 1628) Temp Source: Oral (01/04 1628) BP: 148/84 (01/04 1628) Pulse Rate: 76 (01/04 1628)  Labs: Recent Labs    11/21/17 0727  11/22/17 0748 11/22/17 1813 11/23/17 0312 11/23/17 1001  HGB  --    < > 9.6*  --  7.2* 7.2*  HCT  --   --  30.1*  --  22.5* 22.5*  PLT  --   --  192  --  151 142*  APTT  --   --   --  88* 136*  --   HEPARINUNFRC  --   --   --   --  0.92*  --   CREATININE 1.22*  --  1.48*  --  0.99  --    < > = values in this interval not displayed.    Estimated Creatinine Clearance: 51.7 mL/min (by C-G formula based on SCr of 0.99 mg/dL).   Assessment: On apixaban PTA for afib/hx CVA, was on heparin while apixaban is on hold until sudden drop in hgb. No overt bleeding noted, orders to start lovenox for afib.    Last hgb was 7.2.   Goal of Therapy:  Heparin level 0.6-1.2 Monitor platelets by anticoagulation protocol: Yes   Plan:  Lovenox 65mg  q12 hours - start now CBC daily for now   PharmD., BCPS Clinical Pharmacist 11/23/2017 4:56 PM

## 2017-11-24 LAB — BASIC METABOLIC PANEL
ANION GAP: 8 (ref 5–15)
BUN: 7 mg/dL (ref 6–20)
CO2: 19 mmol/L — ABNORMAL LOW (ref 22–32)
Calcium: 7.9 mg/dL — ABNORMAL LOW (ref 8.9–10.3)
Chloride: 113 mmol/L — ABNORMAL HIGH (ref 101–111)
Creatinine, Ser: 0.74 mg/dL (ref 0.44–1.00)
Glucose, Bld: 106 mg/dL — ABNORMAL HIGH (ref 65–99)
POTASSIUM: 3 mmol/L — AB (ref 3.5–5.1)
SODIUM: 140 mmol/L (ref 135–145)

## 2017-11-24 LAB — CBC
HEMATOCRIT: 26.9 % — AB (ref 36.0–46.0)
HEMOGLOBIN: 8.7 g/dL — AB (ref 12.0–15.0)
MCH: 24.1 pg — ABNORMAL LOW (ref 26.0–34.0)
MCHC: 32.3 g/dL (ref 30.0–36.0)
MCV: 74.5 fL — ABNORMAL LOW (ref 78.0–100.0)
Platelets: 135 10*3/uL — ABNORMAL LOW (ref 150–400)
RBC: 3.61 MIL/uL — AB (ref 3.87–5.11)
RDW: 20 % — AB (ref 11.5–15.5)
WBC: 12.4 10*3/uL — AB (ref 4.0–10.5)

## 2017-11-24 LAB — GLUCOSE, CAPILLARY
GLUCOSE-CAPILLARY: 98 mg/dL (ref 65–99)
Glucose-Capillary: 127 mg/dL — ABNORMAL HIGH (ref 65–99)
Glucose-Capillary: 181 mg/dL — ABNORMAL HIGH (ref 65–99)
Glucose-Capillary: 193 mg/dL — ABNORMAL HIGH (ref 65–99)

## 2017-11-24 LAB — MAGNESIUM: Magnesium: 1.4 mg/dL — ABNORMAL LOW (ref 1.7–2.4)

## 2017-11-24 MED ORDER — PANTOPRAZOLE SODIUM 40 MG PO TBEC
80.0000 mg | DELAYED_RELEASE_TABLET | Freq: Every day | ORAL | Status: DC
  Administered 2017-11-24 – 2017-11-26 (×3): 80 mg via ORAL
  Filled 2017-11-24 (×3): qty 2

## 2017-11-24 MED ORDER — OXYCODONE HCL 5 MG PO TABS
5.0000 mg | ORAL_TABLET | Freq: Four times a day (QID) | ORAL | Status: DC | PRN
  Administered 2017-11-24 – 2017-11-26 (×7): 5 mg via ORAL
  Filled 2017-11-24 (×7): qty 1

## 2017-11-24 MED ORDER — TORSEMIDE 20 MG PO TABS
10.0000 mg | ORAL_TABLET | Freq: Every day | ORAL | Status: DC
  Administered 2017-11-24 – 2017-11-26 (×3): 10 mg via ORAL
  Filled 2017-11-24 (×3): qty 1

## 2017-11-24 MED ORDER — VALPROIC ACID 250 MG PO CAPS
500.0000 mg | ORAL_CAPSULE | Freq: Two times a day (BID) | ORAL | Status: DC
  Administered 2017-11-24 – 2017-11-26 (×5): 500 mg via ORAL
  Filled 2017-11-24 (×5): qty 2

## 2017-11-24 MED ORDER — APIXABAN 5 MG PO TABS
5.0000 mg | ORAL_TABLET | Freq: Two times a day (BID) | ORAL | Status: DC
  Administered 2017-11-24 – 2017-11-26 (×4): 5 mg via ORAL
  Filled 2017-11-24 (×4): qty 1

## 2017-11-24 MED ORDER — PREDNISONE 10 MG PO TABS
15.0000 mg | ORAL_TABLET | Freq: Every day | ORAL | Status: DC
  Administered 2017-11-25 – 2017-11-26 (×2): 15 mg via ORAL
  Filled 2017-11-24 (×2): qty 2

## 2017-11-24 MED ORDER — POTASSIUM CHLORIDE CRYS ER 20 MEQ PO TBCR
20.0000 meq | EXTENDED_RELEASE_TABLET | Freq: Two times a day (BID) | ORAL | Status: DC
  Administered 2017-11-24 – 2017-11-26 (×5): 20 meq via ORAL
  Filled 2017-11-24 (×5): qty 1

## 2017-11-24 MED ORDER — GABAPENTIN 400 MG PO CAPS
800.0000 mg | ORAL_CAPSULE | Freq: Two times a day (BID) | ORAL | Status: DC
  Administered 2017-11-24 – 2017-11-26 (×4): 800 mg via ORAL
  Filled 2017-11-24 (×4): qty 2

## 2017-11-24 MED ORDER — LEVETIRACETAM 500 MG PO TABS
500.0000 mg | ORAL_TABLET | Freq: Two times a day (BID) | ORAL | Status: DC
  Administered 2017-11-24 – 2017-11-26 (×5): 500 mg via ORAL
  Filled 2017-11-24 (×5): qty 1

## 2017-11-24 NOTE — Progress Notes (Signed)
Subjective/Chief Complaint: Pt moving her bowels  Wants to eat   Objective: Vital signs in last 24 hours: Temp:  [98 F (36.7 C)-98.8 F (37.1 C)] 98.4 F (36.9 C) (01/05 0314) Pulse Rate:  [70-103] 70 (01/05 0314) Resp:  [16-25] 19 (01/05 0314) BP: (111-155)/(63-91) 111/63 (01/05 0314) SpO2:  [99 %-100 %] 100 % (01/05 0314) Weight:  [54.7 kg (120 lb 9.5 oz)-65.5 kg (144 lb 6.4 oz)] 54.7 kg (120 lb 9.5 oz) (01/05 0511) Last BM Date: 11/23/17  Intake/Output from previous day: 01/04 0701 - 01/05 0700 In: 1560 [I.V.:925; Blood:315; IV Piggyback:320] Out: 4925 [Urine:4925] Intake/Output this shift: No intake/output data recorded.  General appearance: alert and cooperative Cardio: regular rate and rhythm, S1, S2 normal, no murmur, click, rub or gallop GI: soft, non-tender; bowel sounds normal; no masses,  no organomegaly  Lab Results:  Recent Labs    11/23/17 1001 11/24/17 0255  WBC 14.8* 12.4*  HGB 7.2* 8.7*  HCT 22.5* 26.9*  PLT 142* 135*   BMET Recent Labs    11/23/17 0312 11/24/17 0255  NA 143 140  K 3.5 3.0*  CL 121* 113*  CO2 17* 19*  GLUCOSE 82 106*  BUN 12 7  CREATININE 0.99 0.74  CALCIUM 7.5* 7.9*   PT/INR No results for input(s): LABPROT, INR in the last 72 hours. ABG No results for input(s): PHART, HCO3 in the last 72 hours.  Invalid input(s): PCO2, PO2  Studies/Results: Dg Chest Port 1 View  Result Date: 11/22/2017 CLINICAL DATA:  Aspiration. EXAM: PORTABLE CHEST 1 VIEW COMPARISON:  11/21/2017.  10/27/2016 . FINDINGS: NG tube noted coiled in the stomach. Cardiomegaly with normal pulmonary vascularity. Diffuse bilateral interstitial prominence noted. These changes are unchanged and consistent chronic interstitial lung disease. No pleural effusion or pneumothorax. Degenerative changes thoracic spine . Cervicothoracic spine fusion with hardware intact. Old right rib fractures are present. IMPRESSION: 1. NG tube noted coiled stomach. 2. Diffuse  bilateral pulmonary interstitial prominence noted. Similar findings noted on prior exams. Changes consistent with chronic interstitial lung disease. Electronically Signed   By: Maisie Fus  Register   On: 11/22/2017 16:08   Dg Abd Portable 1v-small Bowel Obstruction Protocol-initial, 8 Hr Delay  Result Date: 11/22/2017 CLINICAL DATA:  Small bowel obstruction. EXAM: PORTABLE ABDOMEN - 1 VIEW COMPARISON:  11/22/2017 FINDINGS: Dilated gas-filled mid abdominal small bowel consistent with obstruction. Dilute contrast material is demonstrated in the colon suggesting that this represents partial obstruction. Enteric tube with tip in the right upper quadrant consistent with location in the distal stomach. Surgical clips in the right upper quadrant. IMPRESSION: Gaseous distention of small bowel consistent with obstruction. Contrast material in the colon suggesting partial obstruction. Enteric tube tip in the right upper quadrant consistent with location in the distal stomach. Electronically Signed   By: Burman Nieves M.D.   On: 11/22/2017 22:08    Anti-infectives: Anti-infectives (From admission, onward)   Start     Dose/Rate Route Frequency Ordered Stop   11/19/17 2200  piperacillin-tazobactam (ZOSYN) IVPB 3.375 g  Status:  Discontinued     3.375 g 12.5 mL/hr over 240 Minutes Intravenous Every 8 hours 11/19/17 1904 11/21/17 1606   11/19/17 1915  vancomycin (VANCOCIN) 500 mg in sodium chloride 0.9 % 100 mL IVPB  Status:  Discontinued     500 mg 100 mL/hr over 60 Minutes Intravenous Every 12 hours 11/19/17 1904 11/21/17 1606   11/18/17 1730  piperacillin-tazobactam (ZOSYN) IVPB 3.375 g     3.375 g 100 mL/hr  over 30 Minutes Intravenous  Once 11/18/17 1726 11/18/17 2018   11/18/17 1730  vancomycin (VANCOCIN) IVPB 1000 mg/200 mL premix     1,000 mg 200 mL/hr over 60 Minutes Intravenous  Once 11/18/17 1726 11/18/17 2018      Assessment/Plan: pSBO resolving  D/C NGT  CLEARS    LOS: 6 days    Maisie Fus A  Joanna Borawski 11/24/2017

## 2017-11-24 NOTE — Plan of Care (Signed)
Pt progressing; will continue to monitor condition.

## 2017-11-24 NOTE — Progress Notes (Signed)
Subjective:  Patient talkative this AM and complains of abdominal pain, but nothing else. States that she drank some broth and has some current nausea but no vomiting. Her last bowel movement was "3 minutes ago".   Objective:  Vital signs in last 24 hours: Vitals:   11/24/17 0314 11/24/17 0511 11/24/17 0815 11/24/17 0817  BP: 111/63   (!) 143/81  Pulse: 70   63  Resp: 19   20  Temp: 98.4 F (36.9 C)   98.2 F (36.8 C)  TempSrc: Oral   Oral  SpO2: 100%  100% 100%  Weight:  120 lb 9.5 oz (54.7 kg)    Height:       Physical Exam  Constitutional:  Patient sitting in bed without NG tube, comfortable and in no acute distress.  Cardiovascular: Normal rate, regular rhythm and intact distal pulses. Exam reveals no friction rub.  No murmur heard. Respiratory:  No crackles or wheezing appreciated in anterior lung fields.  GI:  Significant interval improvement in abdominal distension. Stomach non-distended and non-tender with palpation. Active bowel sounds in both upper quadrants.  Musculoskeletal: She exhibits edema (2+ pitting edema to knees bilaterally, interval worsening in feet and lower extremities from yesterday's exam. Trace pitting edema also observed  in bilateral upper extremities L>R.). She exhibits no tenderness (1+ pitting edema to knees bilaterally).  Neurological:  Patient awake and alert this AM. Gross movement of all 4 extremities.  Skin: Skin is warm and dry. No rash noted. No erythema.   Assessment/Plan:  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 She was admitted to the internal medicine teaching service for management. The specific problems addressed during admission are as  follows:  SBO: Interval improvement in abdominal exam from yesterday and patient stating she wants to eat. NG tube removed per surgery recommendations and patient tolerating clear liquid diet with some nausea but no vomiting.  -Surgery consulted, recommendations appreciated -NG tube removed today -Clear liquid diet, will transition back to PO medications  -Continue to monitor  Chronic back pain: Home regimen includes gabapentin 800 mg TID, oxycodone-12hr 20 mg BID with oxycodone 20mg  QID PRN, and tizanidine 4mg  QID PRN for muscle spasms. Will start low dose oxycodone for pain management, but patient somnolent on home regimen so will add back very slowly. -Gabapentin 800 mg BID -Oxycodone 5 mg q6 hours PRN -Prednisone 15 mg daily -IV Toradol q6h PRN for breakthrough pain -Plan to start bowel regimen tomorrow to prevent constipation while on opioid medications, as this has been present on multiple hospitalizations. -PT/OT following -SW consulted for SNF placement  Hx of HTN: Holding home metoprolol 25mg  qday. Will start torsemide given clinical signs of volume overload in setting of IVF while being NPO -Torsemide 10 mg   Seizure disorder: No clinical signs or symptoms of seizures. At home patient takes Keppra 500 mg BID and valproic acid 500mg  BID. - Home Keppra 500 mg BID, valproic acid 500 mg BID  DM2: Problem well controlled at baseline with Hemoglobin A1c 4.7 in 10/2016 (but patient has chronic anemia).  -CBG monitoring -Moderate SSI TID when patient not NPO  Hx of embolic CVA in setting of A-Fib with TEE in 11/2016 that showed large PFO: Patient currently  in NSR. Cardiology recommends follow up as outpatient for closure of PFO. On eliquis 5mg  BID for anti-coagulation.   FEN/GI: -Clear liquid diet -Discontinue IVF, replace electrolytes as needed -Home Protonix 80 mg daily  VTE Prophylaxis: Eliquis 5 mg BID Code Status: Full  Dispo: Anticipated discharge in 2-3 days.  , MD 11/24/2017, 12:01 PM

## 2017-11-25 DIAGNOSIS — D62 Acute posthemorrhagic anemia: Secondary | ICD-10-CM

## 2017-11-25 DIAGNOSIS — K56609 Unspecified intestinal obstruction, unspecified as to partial versus complete obstruction: Secondary | ICD-10-CM

## 2017-11-25 HISTORY — DX: Acute posthemorrhagic anemia: D62

## 2017-11-25 HISTORY — DX: Unspecified intestinal obstruction, unspecified as to partial versus complete obstruction: K56.609

## 2017-11-25 LAB — CBC
HEMATOCRIT: 29.3 % — AB (ref 36.0–46.0)
HEMOGLOBIN: 9.5 g/dL — AB (ref 12.0–15.0)
MCH: 24.3 pg — AB (ref 26.0–34.0)
MCHC: 32.4 g/dL (ref 30.0–36.0)
MCV: 74.9 fL — AB (ref 78.0–100.0)
Platelets: 126 10*3/uL — ABNORMAL LOW (ref 150–400)
RBC: 3.91 MIL/uL (ref 3.87–5.11)
RDW: 20.2 % — AB (ref 11.5–15.5)
WBC: 11.1 10*3/uL — ABNORMAL HIGH (ref 4.0–10.5)

## 2017-11-25 LAB — GLUCOSE, CAPILLARY
GLUCOSE-CAPILLARY: 153 mg/dL — AB (ref 65–99)
Glucose-Capillary: 138 mg/dL — ABNORMAL HIGH (ref 65–99)
Glucose-Capillary: 216 mg/dL — ABNORMAL HIGH (ref 65–99)
Glucose-Capillary: 217 mg/dL — ABNORMAL HIGH (ref 65–99)

## 2017-11-25 LAB — BASIC METABOLIC PANEL
ANION GAP: 7 (ref 5–15)
BUN: 6 mg/dL (ref 6–20)
CO2: 22 mmol/L (ref 22–32)
Calcium: 8 mg/dL — ABNORMAL LOW (ref 8.9–10.3)
Chloride: 111 mmol/L (ref 101–111)
Creatinine, Ser: 0.86 mg/dL (ref 0.44–1.00)
GFR calc Af Amer: >60 mL/min (ref >60)
GLUCOSE: 230 mg/dL — AB (ref 65–99)
POTASSIUM: 3.1 mmol/L — AB (ref 3.5–5.1)
Sodium: 140 mmol/L (ref 135–145)

## 2017-11-25 MED ORDER — OXYCODONE HCL 5 MG PO TABS
5.0000 mg | ORAL_TABLET | Freq: Once | ORAL | Status: AC
  Administered 2017-11-25: 5 mg via ORAL
  Filled 2017-11-25: qty 1

## 2017-11-25 MED ORDER — BENZONATATE 100 MG PO CAPS
100.0000 mg | ORAL_CAPSULE | Freq: Two times a day (BID) | ORAL | Status: DC | PRN
  Administered 2017-11-25: 100 mg via ORAL
  Filled 2017-11-25: qty 1

## 2017-11-25 MED ORDER — FERROUS SULFATE 325 (65 FE) MG PO TABS
325.0000 mg | ORAL_TABLET | Freq: Every day | ORAL | Status: DC
  Administered 2017-11-25 – 2017-11-26 (×2): 325 mg via ORAL
  Filled 2017-11-25 (×2): qty 1

## 2017-11-25 MED ORDER — POTASSIUM CHLORIDE CRYS ER 20 MEQ PO TBCR
40.0000 meq | EXTENDED_RELEASE_TABLET | Freq: Once | ORAL | Status: AC
  Administered 2017-11-25: 40 meq via ORAL
  Filled 2017-11-25: qty 2

## 2017-11-25 MED ORDER — KETOROLAC TROMETHAMINE 15 MG/ML IJ SOLN
15.0000 mg | Freq: Once | INTRAMUSCULAR | Status: AC
  Administered 2017-11-25: 15 mg via INTRAVENOUS
  Filled 2017-11-25: qty 1

## 2017-11-25 NOTE — Progress Notes (Signed)
   Subjective: Ms. Sharpless is feeling well today. She is having regular bowel movements, she is eating a soft diet. She feels [place] and ready to go home. Discussed PT recommendations for 24 hour assistance through SNF. Ms. 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   Objective:  Vital signs in last 24 hours: Vitals:   11/25/17 0400 11/25/17 0500 11/25/17 0737 11/25/17 0824  BP: 125/76   126/74  Pulse: 72   63  Resp: (!) 21   19  Temp: 98.8 F (37.1 C)   98.4 F (36.9 C)  TempSrc: Oral   Oral  SpO2: 100%  100% 98%  Weight:  126 lb 15.8 oz (57.6 kg)    Height:       General: sitting up in bed smiling, conversational, well appearing, no acute distress  Cardiac: RRR, no murmur appreciated, no peripheral edema  Pulm: LCTAB   Assessment/Plan:  Ms. 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 She was admitted to the internal medicine teaching service for management. The specific problems addressed during admission are as follows:  SBO  - resolved  - NGT removed yesterday, tolerating clear liquids today  - surgery signing off, no need for follow up   - suspect this may have been related to opioids, we have reduced the doses of these and will use only as needed   Chronic back pain  - pain controlled with current regiment - gabapentin 800 mg BID, oxy 5 mg q6hr PRN, prednisone 15 mg qd - Senokot PRN, will transition to scheduled regiment when frequent loose BM have improved  - PT/OT recommend SNF  - SW consulted for SNF placement   Hypokalemia    - K 3.1 today, repleting - follow up BMP morning     Essential hypertension - home med torsemide restarted yesterday, BP well controlled today  - can consider adding back home metoprolol if BP trends up    Seizure (HCC) - stable  - continue keppra and valproic acid     Diabetes mellitus type 2, controlled (HCC) - blood glucose controlled  - taking prednisone daily  - continue CBG monitoring and moderate SSI   Hx of embolic CVA in setting of A-Fib with TEE in 11/2016 that showed large PFO - currently in NSR -Cardiology follow up as outpatient for closure of PFO - eliquis 5mg  BID for anti-coagulation.   Acute on chronic anemia  - hgb 9.5 today  - s/p 1 unit transfusion 1/4, no clear source of acute drop in hgb   - iron panel in early December consistent with anemia of chronic disease with underlying iron deficiency  - start daily iron supplementation  - continue to monitor     Asthma - stable  - cont dulera BID   Dispo: Anticipated discharge in approximately 1-2 day(s), awaiting SNF placement   January, MD 11/25/2017, 10:20 AM Pager: [phone]

## 2017-11-25 NOTE — Clinical Social Work Note (Signed)
Clinical Social Work Assessment  Patient Details  Name: Amber [name] MRN: [id number] Date of Birth: [birth year]  Date of referral:  11/25/17               Reason for consult:  Facility Placement                Permission sought to share information with:  Family Supports Permission granted to share information::  Yes, Verbal Permission Granted  Name::        Agency::     Relationship::     Contact Information:     Housing/Transportation Living arrangements for the past 2 months:  Single Family Home Source of Information:  Adult Children Patient Interpreter Needed:  None Criminal Activity/Legal Involvement Pertinent to Current Situation/Hospitalization:  No - Comment as needed Significant Relationships:  Adult Children, Other Family Members Lives with:  Minor Children Do you feel safe going back to the place where you live?    Need for family participation in patient care:     Care giving concerns:  Pt denies any concerns at this time.   Social Worker assessment / plan:  CSW spoke with pt at bedside. Pt is agreeable to SNF at d/c. Pt is agreeable to give up Medicaid check to facility. Pt wants to go somewhere in [place] so her family can visit. Pt lives with her grandson. CSW will f/o to [facility] and follow up with bed offers.  Employment status:    Insurance information:  Medicaid [place] PT Recommendations:  Skilled Nursing Facility Information / Referral to community resources:  Skilled Nursing Facility  Patient/Family's Response to care:  Pt verbalized understanding of CSW role and expressed appreciation for support. Pt denies any concern regarding pt care at this time.   Patient/Family's Understanding of and Emotional Response to Diagnosis, Current Treatment, and Prognosis:  Pt understanding and realistic regarding physical limitations. Pt understands the need for SNF placement at d/c. Pt agreeable to SNF placement at d/c, at this time. Pt's responses emotionally  appropriate during conversation with CSW. Pt denies any concern regarding treatment plan at this time. CSW will continue to provide support and facilitate d/c needs.   Emotional Assessment Appearance:  Appears older than stated age Attitude/Demeanor/Rapport:  (Patient was appropriate.) Affect (typically observed):  Accepting, Appropriate, Calm Orientation:  Oriented to Self, Oriented to Place, Oriented to  Time, Oriented to Situation Alcohol / Substance use:    Psych involvement (Current and /or in the community):  No (Comment)  Discharge Needs  Concerns to be addressed:  Care Coordination Readmission within the last 30 days:  Yes Current discharge risk:  Dependent with Mobility Barriers to Discharge:  Inadequate or no insurance, Continued Medical Work up   [employer], LCSW 11/25/2017, 1:37 PM

## 2017-11-25 NOTE — Progress Notes (Signed)
   Subjective/Chief Complaint: Having bms and flatus, tol clears, no n/v hungry   Objective: Vital signs in last 24 hours: Temp:  [98.1 F (36.7 C)-98.8 F (37.1 C)] 98.8 F (37.1 C) (01/06 0400) Pulse Rate:  [57-96] 72 (01/06 0400) Resp:  [17-21] 21 (01/06 0400) BP: (125-149)/(76-92) 125/76 (01/06 0400) SpO2:  [98 %-100 %] 100 % (01/06 0737) Weight:  [57.6 kg (126 lb 15.8 oz)] 57.6 kg (126 lb 15.8 oz) (01/06 0500) Last BM Date: 11/24/17  Intake/Output from previous day: 01/05 0701 - 01/06 0700 In: 596 [P.O.:596] Out: 2550 [Urine:2550] Intake/Output this shift: No intake/output data recorded.  GI: soft nt/nd bs present  Lab Results:  Recent Labs    11/24/17 0255 11/25/17 0255  WBC 12.4* 11.1*  HGB 8.7* 9.5*  HCT 26.9* 29.3*  PLT 135* 126*   BMET Recent Labs    11/24/17 0255 11/25/17 0255  NA 140 140  K 3.0* 3.1*  CL 113* 111  CO2 19* 22  GLUCOSE 106* 230*  BUN 7 6  CREATININE 0.74 0.86  CALCIUM 7.9* 8.0*   PT/INR No results for input(s): LABPROT, INR in the last 72 hours. ABG No results for input(s): PHART, HCO3 in the last 72 hours.  Invalid input(s): PCO2, PO2  Studies/Results: No results found.  Anti-infectives: Anti-infectives (From admission, onward)   Start     Dose/Rate Route Frequency Ordered Stop   11/19/17 2200  piperacillin-tazobactam (ZOSYN) IVPB 3.375 g  Status:  Discontinued     3.375 g 12.5 mL/hr over 240 Minutes Intravenous Every 8 hours 11/19/17 1904 11/21/17 1606   11/19/17 1915  vancomycin (VANCOCIN) 500 mg in sodium chloride 0.9 % 100 mL IVPB  Status:  Discontinued     500 mg 100 mL/hr over 60 Minutes Intravenous Every 12 hours 11/19/17 1904 11/21/17 1606   11/18/17 1730  piperacillin-tazobactam (ZOSYN) IVPB 3.375 g     3.375 g 100 mL/hr over 30 Minutes Intravenous  Once 11/18/17 1726 11/18/17 2018   11/18/17 1730  vancomycin (VANCOCIN) IVPB 1000 mg/200 mL premix     1,000 mg 200 mL/hr over 60 Minutes Intravenous  Once  11/18/17 1726 11/18/17 2018      Assessment/Plan: SBO, resolved clinically  Full liquids, advance as tolerated Will sign off Can dc home on full liquids if tolerates fine and advance at home Does not need follow up with surgery Emelia Loron 11/25/2017

## 2017-11-25 NOTE — Clinical Social Work Note (Signed)
Pt has a bed for pt at [facility] in [facility] for pt today, however CSW got a call from daughter stating she just spoke with her mother in the room and her mother states she does not want to go to SNF and that she is going home. CSW explained the importance of safety and for the pt to have someone 24/7. Pt does live with granddaughter but granddaughter goes to school in the evenings --per pt. MD notified and will talk to pt in the morning. CSW will reach out to facility to explain the possible need for a bed tomorrow. CSW will continue to follow.   [place], [place] [phone]

## 2017-11-25 NOTE — NC FL2 (Addendum)
[place] MEDICAID FL2 LEVEL OF CARE SCREENING TOOL     IDENTIFICATION  Patient Name: Amber [name] Birthdate: October 02, 1956 Sex: female Admission Date (Current Location): 11/18/2017  [facility] and [place] Number:  Producer, television/film/video and Address:  [facility] [place], [address] [place], [place] [address]      Provider Number: [id number]  Attending Physician Name and Address:  Levert Feinstein, MD  Relative Name and Phone Number:       Current Level of Care: Hospital Recommended Level of Care: Skilled Nursing Facility Prior Approval Number:    Date Approved/Denied:   PASRR Number: [id number]  Discharge Plan: SNF    Current Diagnoses: Patient Active Problem List   Diagnosis Date Noted  . Hypotension 11/18/2017  . Fall   . Rib pain 10/20/2017  . PFO (patent foramen ovale) 02/15/2017  . Impacted fracture 02/15/2017  . History of CVA with residual deficit 02/15/2017  . Drug-induced hyperglycemia 02/15/2017  . Altered mental status   . Chest pain 11/23/2016  . CVA (cerebral vascular accident) (HCC) 10/28/2016  . Tachycardia   . Seizure (HCC) 10/26/2016  . Embolic stroke (HCC) 10/26/2016  . New onset atrial fibrillation (HCC) 10/26/2016  . 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 Focal seizure (HCC)   . Shoulder pain, right   . Duodenitis   . Abdominal pain 11/07/2015  . Constipation 11/07/2015  . Abdominal distension   . Chest pain, musculoskeletal 08/24/2015  . Diabetes mellitus type 2, controlled (HCC) 07/27/2015  . Chronic back pain   . Frequent falls   . Essential hypertension   . Pulmonary hypertension (HCC)   . Gout   . Asthma 06/12/2015  . ACS (acute coronary syndrome) (HCC) 06/12/2015  . Microcytic anemia 01/26/2015  . History of seizures 01/26/2015    Orientation RESPIRATION BLADDER Height & Weight     Self, Time, Situation, Place  Normal Continent Weight: 126 lb 15.8 oz (57.6 kg) Height:  5'  (152.4 cm)  BEHAVIORAL SYMPTOMS/MOOD NEUROLOGICAL BOWEL NUTRITION STATUS      Continent (Please see d/c summary)  AMBULATORY STATUS COMMUNICATION OF NEEDS Skin   Extensive Assist Verbally Normal                       Personal Care Assistance Level of Assistance  Bathing, Feeding, Dressing Bathing Assistance: Maximum assistance Feeding assistance: Limited assistance Dressing Assistance: Maximum assistance     Functional Limitations Info  Sight, Hearing, Speech Sight Info: Adequate Hearing Info: Adequate Speech Info: Adequate    SPECIAL CARE FACTORS FREQUENCY  PT (By licensed PT), OT (By licensed OT)                    Contractures      Additional Factors Info                  Current Medications (11/25/2017):  This is the current hospital active medication list Current Facility-Administered Medications  Medication Dose Route Frequency Provider Last Rate Last Dose  . allopurinol (ZYLOPRIM) tablet 300 mg  300 mg Oral Daily Scherrie Gerlach, MD   300 mg at 11/25/17 0857  . apixaban (ELIQUIS) tablet 5 mg  5 mg Oral BID Rozann Lesches, MD   5 mg at 11/25/17 0857  . atorvastatin (LIPITOR) tablet 10 mg  10 mg Oral Daily Reymundo Poll, MD   10 mg at 11/25/17 0857  . benzonatate (TESSALON) capsule 100 mg  100 mg Oral BID PRN Scherrie Gerlach, MD   100 mg at 11/25/17 0858  . chlorhexidine (PERIDEX) 0.12 % solution 15 mL  15 mL Mouth Rinse BID Levert Feinstein, MD   15 mL at 11/25/17 0856  . folic acid (FOLVITE) tablet 1 mg  1 mg Oral Daily Reymundo Poll, MD   1 mg at 11/25/17 0857  . gabapentin (NEURONTIN) capsule 800 mg  800 mg Oral BID Nedrud, Jeanella Flattery, MD   800 mg at 11/25/17 0858  . insulin aspart (novoLOG) injection 0-15 Units  0-15 Units Subcutaneous TID WC Reymundo Poll, MD   3 Units at 11/25/17 0855  . insulin aspart (novoLOG) injection 0-5 Units  0-5 Units Subcutaneous QHS Reymundo Poll, MD   Stopped at 11/19/17 [phone]  . lactulose (CHRONULAC)  10 GM/15ML solution 20 g  20 g Oral BID PRN Reymundo Poll, MD   20 g at 11/20/17 2100  . levETIRAcetam (KEPPRA) tablet 500 mg  500 mg Oral BID Rozann Lesches, MD   500 mg at 11/25/17 0858  . MEDLINE mouth rinse  15 mL Mouth Rinse q12n4p Levert Feinstein, MD   15 mL at 11/24/17 1230  . mometasone-formoterol (DULERA) 200-5 MCG/ACT inhaler 2 puff  2 puff Inhalation BID Reymundo Poll, MD   2 puff at 11/25/17 0737  . ondansetron (ZOFRAN) injection 4 mg  4 mg Intravenous Q6H PRN Nedrud, Marybeth, MD      . oxyCODONE (Oxy IR/ROXICODONE) immediate release tablet 5 mg  5 mg Oral Q6H PRN Rozann Lesches, MD   5 mg at 11/24/17 2256  . pantoprazole (PROTONIX) EC tablet 80 mg  80 mg Oral Daily Nedrud, Jeanella Flattery, MD   80 mg at 11/25/17 0954  . potassium chloride SA (K-DUR,KLOR-CON) CR tablet 20 mEq  20 mEq Oral BID Rozann Lesches, MD   20 mEq at 11/25/17 0954  . potassium chloride SA (K-DUR,KLOR-CON) CR tablet 40 mEq  40 mEq Oral Once Eulah Pont, MD      . predniSONE (DELTASONE) tablet 15 mg  15 mg Oral Q breakfast Nedrud, Jeanella Flattery, MD   15 mg at 11/25/17 0954  . senna-docusate (Senokot-S) tablet 2 tablet  2 tablet Oral BID PRN Burna Cash, MD      . tiZANidine (ZANAFLEX) tablet 4 mg  4 mg Oral QID PRN Reymundo Poll, MD   4 mg at 11/24/17 2118  . torsemide (DEMADEX) tablet 10 mg  10 mg Oral Daily Nedrud, Jeanella Flattery, MD   10 mg at 11/25/17 0856  . valproic acid (DEPAKENE) 250 MG capsule 500 mg  500 mg Oral BID Rozann Lesches, MD   500 mg at 11/24/17 2118     Discharge Medications: Please see discharge summary for a list of discharge medications.  Relevant Imaging Results:  Relevant Lab Results:   Additional Information  SSN:  [id number]    Maree Krabbe, LCSW

## 2017-11-26 LAB — BPAM RBC
BLOOD PRODUCT EXPIRATION DATE: 201902012359
Blood Product Expiration Date: 201902012359
ISSUE DATE / TIME: 201901041319
UNIT TYPE AND RH: 5100
UNIT TYPE AND RH: 5100

## 2017-11-26 LAB — BASIC METABOLIC PANEL
Anion gap: 8 (ref 5–15)
BUN: 8 mg/dL (ref 6–20)
CHLORIDE: 106 mmol/L (ref 101–111)
CO2: 27 mmol/L (ref 22–32)
Calcium: 8.4 mg/dL — ABNORMAL LOW (ref 8.9–10.3)
Creatinine, Ser: 0.76 mg/dL (ref 0.44–1.00)
GFR calc Af Amer: >60 mL/min (ref >60)
GLUCOSE: 216 mg/dL — AB (ref 65–99)
POTASSIUM: 4.2 mmol/L (ref 3.5–5.1)
Sodium: 141 mmol/L (ref 135–145)

## 2017-11-26 LAB — GLUCOSE, CAPILLARY
GLUCOSE-CAPILLARY: 193 mg/dL — AB (ref 65–99)
Glucose-Capillary: 205 mg/dL — ABNORMAL HIGH (ref 65–99)

## 2017-11-26 LAB — CBC
HEMATOCRIT: 27.3 % — AB (ref 36.0–46.0)
HEMOGLOBIN: 9 g/dL — AB (ref 12.0–15.0)
MCH: 25.3 pg — AB (ref 26.0–34.0)
MCHC: 33 g/dL (ref 30.0–36.0)
MCV: 76.7 fL — AB (ref 78.0–100.0)
Platelets: 100 10*3/uL — ABNORMAL LOW (ref 150–400)
RBC: 3.56 MIL/uL — ABNORMAL LOW (ref 3.87–5.11)
RDW: 21.2 % — AB (ref 11.5–15.5)
WBC: 9.3 10*3/uL (ref 4.0–10.5)

## 2017-11-26 LAB — TYPE AND SCREEN
ABO/RH(D): O POS
Antibody Screen: NEGATIVE
Unit division: 0
Unit division: 0

## 2017-11-26 MED ORDER — PANTOPRAZOLE SODIUM 40 MG PO TBEC: 80.0000 mg | DELAYED_RELEASE_TABLET | Freq: Every day | ORAL | 0 refills | Status: DC

## 2017-11-26 MED ORDER — OXYCODONE HCL 5 MG PO TABS: 5.0000 mg | ORAL_TABLET | Freq: Four times a day (QID) | ORAL | 0 refills | Status: DC | PRN

## 2017-11-26 MED ORDER — TIZANIDINE HCL 4 MG PO TABS: 4.0000 mg | ORAL_TABLET | Freq: Four times a day (QID) | ORAL | 0 refills | Status: DC | PRN

## 2017-11-26 MED ORDER — PREDNISONE 10 MG PO TABS
15.0000 mg | ORAL_TABLET | Freq: Every day | ORAL | 0 refills | Status: DC
Start: 2017-11-26 — End: 2018-09-24

## 2017-11-26 MED ORDER — METOPROLOL SUCCINATE ER 25 MG PO TB24
25.0000 mg | ORAL_TABLET | Freq: Every day | ORAL | Status: DC
  Administered 2017-11-26: 25 mg via ORAL
  Filled 2017-11-26: qty 1

## 2017-11-26 MED ORDER — SENNOSIDES-DOCUSATE SODIUM 8.6-50 MG PO TABS: 2.0000 | ORAL_TABLET | Freq: Two times a day (BID) | ORAL | 0 refills | Status: DC

## 2017-11-26 MED ORDER — GABAPENTIN 400 MG PO CAPS: 800.0000 mg | ORAL_CAPSULE | Freq: Two times a day (BID) | ORAL | 0 refills | Status: DC

## 2017-11-26 MED ORDER — FERROUS SULFATE 325 (65 FE) MG PO TABS: 325.0000 mg | ORAL_TABLET | Freq: Every day | ORAL | 3 refills | Status: DC

## 2017-11-26 NOTE — Discharge Summary (Signed)
Name: Amber [name] MRN: [id number] DOB: 1956/01/17 62 y.o. PCP: Sandi Mariscal, MD  Date of Admission: 11/18/2017  5:11 PM Date of Discharge: 11/26/2016 Attending Physician: Annia Belt, MD  Discharge Diagnosis:  Active Problems:   Hypokalemia   Asthma   Diabetes mellitus type 2, controlled (HCC)   Chronic back pain   Essential hypertension   Seizure ([place])   Small bowel obstruction ([place])   Acute on chronic blood loss anemia ([place])   Discharge Medications: Allergies as of 11/26/2017      Reactions   Ivp Dye [iodinated Diagnostic Agents] Itching   Metrizamide Itching      Medication List    STOP taking these medications   lactulose 10 GM/15ML solution Commonly known as:  CHRONULAC   loperamide 2 MG capsule Commonly known as:  IMODIUM   triamcinolone 0.025 % cream Commonly known as:  KENALOG     TAKE these medications   albuterol 108 (90 Base) MCG/ACT inhaler Commonly known as:  PROVENTIL HFA;VENTOLIN HFA Inhale 2 puffs into the lungs every 6 (six) hours as needed for wheezing or shortness of breath.   allopurinol 300 MG tablet Commonly known as:  ZYLOPRIM Take 300 mg by mouth daily.   aspirin 325 MG EC tablet Take 1 tablet (325 mg total) by mouth daily.   atorvastatin 10 MG tablet Commonly known as:  LIPITOR Take 10 mg by mouth daily.   ELIQUIS 5 MG Tabs tablet Generic drug:  apixaban Take 5 mg by mouth 2 (two) times daily.   feeding supplement (ENSURE ENLIVE) Liqd Take 237 mLs by mouth 2 (two) times daily between meals. What changed:  additional instructions   ferrous sulfate 325 (65 FE) MG tablet Take 1 tablet (325 mg total) by mouth daily with breakfast. Start taking on:  11/27/2017   Fluticasone-Salmeterol 250-50 MCG/DOSE Aepb Commonly known as:  ADVAIR Inhale 2 puffs into the lungs as needed.   folic acid 1 MG tablet Commonly known as:  FOLVITE Take 1 mg by mouth daily.   gabapentin 400 MG capsule Commonly known as:  NEURONTIN Take 2  capsules (800 mg total) by mouth 2 (two) times daily. What changed:  when to take this   Lacosamide 100 MG Tabs Take 1 tablet (100 mg total) by mouth 2 (two) times daily.   levETIRAcetam 500 MG tablet Commonly known as:  KEPPRA Take 500 mg by mouth 2 (two) times daily.   lidocaine 5 % Commonly known as:  LIDODERM Place 1 patch onto the skin daily. Remove & Discard patch within 12 hours or as directed by MD   metFORMIN 500 MG 24 hr tablet Commonly known as:  GLUCOPHAGE-XR Take 500 mg by mouth daily.   metoprolol succinate 25 MG 24 hr tablet Commonly known as:  TOPROL-XL Take 25 mg by mouth daily.   oxyCODONE 5 MG immediate release tablet Commonly known as:  Oxy IR/ROXICODONE Take 1-2 tablets (5-10 mg total) by mouth every 6 (six) hours as needed for moderate pain or severe pain. What changed:    medication strength  how much to take  when to take this  reasons to take this  Another medication with the same name was removed. Continue taking this medication, and follow the directions you see here.   pantoprazole 40 MG tablet Commonly known as:  PROTONIX Take 2 tablets (80 mg total) by mouth daily. Start taking on:  11/27/2017   Potassium Chloride ER 20 MEQ Tbcr Take 40 mEq by mouth daily.  What changed:    how much to take  when to take this   predniSONE 10 MG tablet Commonly known as:  DELTASONE Take 1.5 tablets (15 mg total) by mouth daily with breakfast. What changed:    medication strength  how much to take  when to take this   senna-docusate 8.6-50 MG tablet Commonly known as:  Senokot-S Take 2 tablets by mouth 2 (two) times daily.   tiZANidine 4 MG tablet Commonly known as:  ZANAFLEX Take 1 tablet (4 mg total) by mouth 4 (four) times daily as needed for muscle spasms.   torsemide 10 MG tablet Commonly known as:  DEMADEX Take 10 mg by mouth daily.   valproic acid 250 MG capsule Commonly known as:  DEPAKENE Take 500 mg by mouth every 12  (twelve) hours.   Vitamin D (Ergocalciferol) 50000 units Caps capsule Commonly known as:  DRISDOL Take 1 capsule (50,000 Units total) by mouth every 7 (seven) days. Sunday What changed:    when to take this  additional instructions       Disposition and follow-up:   AmberAmber [name] was discharged from [facility] in [place] condition.  At the hospital follow up visit please address:  1.  Patient presented with hypotension and altered mental status. Patient improved with IVF and infectious workup was negative. Patient's hospital course was complicated by development of high-grade small bowel obstruction. Her initial presentation and SBO were thought to be 2/2 chronic, high use of narcotic medication as the patient's mental status worsened with administration of home medications during hospitalization and constipation was a large contributor to development of SBO as her symptoms quickly resolved with decompression and bowel rest. Patient's home narcotic medication was tapered during hospitalization. Please assess pain control and compliance with bowel regimen while on chronic opioids for back pain.   2.  Labs / imaging needed at time of follow-up: None  3.  Pending labs/ test needing follow-up: None  Follow-up Appointments:  Contact information for follow-up providers    Sherren Mocha, MD Follow up on 12/06/2017.   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 Contact information: [address] [place] [address] [phone]        Sandi Mariscal, MD. Schedule an appointment as soon as possible for a visit in 1 week(s).   Specialty:  Internal Medicine Why:  Please make an appointment within 1-2 weeks of leaving hospital to discuss your hospitalization and chronic pain medications.  Contact information: [place] [phone]            Contact information for  after-discharge care    Destination    [facility] .   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 She was noted to be hypotensive and with fever to 100.8, suggesting SIRS without clear source of infection. She was and admitted to the internal medicine teaching service for management. The specific problems addressed during admission are as follows:  SIRS on presentation: Patient admitted with hypotension, in 80s/60s, on admission. She was febrile in route to 100.8. Patient reported falling in home 2/2 altered mental status upon presentation. On admission she had extensive imaging to rule out acute fracture given her fall - all of this returned negative for acute fracture or change from baseline. Because of her abnormal vital signs, she met SIRS criteria on admission and was started on IVF with broad spectrum antibiotics. The patient remained afebrile throughout admission. Her infectious workup, including urinalysis, chest x ray, and blood cultures returned negative for infection and broad spectrum antibiotics were discontinued after 72 hours. At the time antibiotics were discontinued, the patient was normotensive and without clinical signs of infection. Patient's clinical course was complicated by small bowel obstruction (see below) the evening that antibiotics were  discontinued. At the time of discharge the patient's was trending towards hypertension and all home blood pressure medications were restarted. Suspect AMS and hypotension were a result of polypharmacy rather than infection, as patient's mental status improved with IVF and with holding of home narcotic pain medication.   High grade small bowel obstruction in mid ileum: Patient developed acute onset projectile vomiting overnight on 11/22/2016. The patient had abdominal imaging, including abdominal Xray and CT, which showed high grade small bowel obstruction in mid ileum. During the day leading up to the development of SBO the patient reported decreased bowel movements and worsening constipation. Upon development of SBO, the patient was made NPO, a NG tube was placed, and surgery was consulted. Repeat imaging studies showed a partial, resolving small bowel obstruction after placement of NG tube and patient's clinical status dramatically improved with bowel rest/decompression. The tube was removed on 11/24/2016 and the patient was tolerating a clear, liquid diet without difficulty on day of discharge. While patient has a history of abdominal surgery, her SBO was thought to be a result of severe constipation 2/2 extensive narcotic use for chronic back pain. Surgery agreed and did not recommend outpatient follow up. Patient's narcotic medications were held while she was NPO 2/2 worsening AMS with narcotic medications and concern for aspiration. Patient's narcotic pain medication was added back very slowly and recommend scheduling Sennakot for bowel stimulation/softener as outpatient while on narcotic medications.   Chronic back pain complicated history of multiple falls and altered mental status while on narcotic medications: Patient has a history of extensive back surgery and has fusion from T1-T9. She reported a fall on admission and all imaging obtained upon arrival to the ED was negative for acute fracture. Per  chart review, the patient had an extensive hom pain regimen that included gabapentin 800 mg TID, Oxycontin 20 mg BID with oxycodone 40m QID PRN, and tizanidine 477mQID PRN for muscle spasms. Given the development of small bowel obstruction while on thes extensive narcotic regimen, will recommend reducing pain regimen as an outpatient. The patient had severe somnolence with administration of oxycodone 20 mg daily while inpatient, do not think this dose is safe to continue as outpatient given history of multiple falls, SBO in setting of chronic constipation 2/2 narcotic use, and current presentation of AMS that was ultimately most consistent with polypharmacy. Patient's pain was managed with IV Toradol while inpatient.  Narcotic medications were added back slowly after resolution of SBO for pain management. She was discharged with oxycodone 5 - 10 mg q6 hours PRN and no long acting medication. She was also given gabapentin 800 mg BID at discharge. Please assess pain control at follow up and consider other types of medications for pain management, including long-acting NSAID and/or physical therapy as an outpatient. Please also consider scheduling a bowel regimen while on narcotic therapy.   Hx of embolic CVA in setting of A-Fib with TEE in 11/2016 that showed large PFO: Patient was on telemetry while inpatient and maintained normal sinus rhythm. Large PFO was observed during hospitalization on 11/2016 but patient failed to follow up as outpatient. Cardiology was reconsulted during this hospitalization, and recommended outpatient follow up or closure of PFO. Patient was maintained on eliquis 103m BID for anti-coagulation and home metoprolol xL 25 mg daily when BP allowed.   Hx of HTN: Patient presented hypotensive, but improved with IVF. She was normotensive on day of discharge and sent to SNF on home torsemide 10 mg daily and metoprolol xL 25 mg daily.  Seizure disorder: No clinical signs or symptoms of seizures  during hospitalization. Patient continued on home patient takes Keppra 500 mg BID and valproic acid 5042mBID while inpatient.  Discharge Vitals:   BP 137/84 (BP Location: Right Arm)   Pulse 78   Temp 98.3 F (36.8 C) (Oral)   Resp (!) 24   Ht 5' (1.524 m)   Wt 116 lb 6.5 oz (52.8 kg)   SpO2 100%   BMI 22.73 kg/m   Pertinent Labs, Studies, and Procedures:   BMP Latest Ref Rng & Units 11/26/2017 11/25/2017 11/24/2017  Glucose 65 - 99 mg/dL 216(H) 230(H) 106(H)  BUN 6 - 20 mg/dL _0 Creatinine 0.44 - 1.00 mg/dL 0.76 0.86 0.74  Sodium 135 - 145 mmol/L 141 140 140  Potassium 3.5 - 5.1 mmol/L 4.2 3.1(L) 3.0(L)  Chloride 101 - 111 mmol/L 106 111 113(H)  CO2 22 - 32 mmol/L 27 22 19(L)  Calcium 8.9 - 10.3 mg/dL 8.4(L) 8.0(L) 7.9(L)   CBC Latest Ref Rng & Units 11/26/2017 11/25/2017 11/24/2017  WBC 4.0 - 10.5 K/uL 9.3 11.1(H) 12.4(H)  Hemoglobin 12.0 - 15.0 g/dL 9.0(L) 9.5(L) 8.7(L)  Hematocrit 36.0 - 46.0 % 27.3(L) 29.3(L) 26.9(L)  Platelets 150 - 400 K/uL 100(L) 126(L) 135(L)   Urinalysis    Component Value Date/Time   COLORURINE YELLOW 11/18/2017 1936   APPEARANCEUR CLEAR 11/18/2017 1936   LABSPEC 1.015 11/18/2017 1936   PHURINE 6.0 11/18/2017 1936   GLUCOSEU NEGATIVE 11/18/2017 [place]GATIVE 11/18/2017 19[place]GATIVE 11/18/2017 19[place]2/30/2018 1936   PROTEINUR NEGATIVE 11/18/2017 1936   UROBILINOGEN 1.0 07/27/2015 1052   NITRITE NEGATIVE 11/18/2017 1936   LEUKOCYTESUR NEGATIVE 11/18/2017 1936   Blood cultures x2, collected on 11/18/18 Specimen Description BLOOD LEFT HAND   Special Requests IN PEDIATRIC BOTTLE Blood Culture adequate volume   Culture NO GROWTH 5 DAYS   Report Status 11/23/2017 FINAL    MRI Thoracic, Lumbar spine on 11/18/2018 IMPRESSION: 1. Stable postsurgical changes status post T1 through T9 fusion and partial corpectomy at T4-6. Suspected chronic myelomalacia at the level of the corpectomy. 2. Compression  deformities at T12, L1 and L3, stable from recent CT. The lumbar fractures have progressed from 05/04/2016 but appear healed. No acute osseous findings. 3. Mild to moderate multifactorial spinal stenosis at L4-5 with asymmetric narrowing  of the left foramen and possible left L4 nerve root encroachment. 4. No other significant spinal stenosis or nerve root encroachment.  X rays ribs, pelvis, shoulder on 11/18/2017 RIBS, IMPRESSION: Remote healed bilateral rib fractures without definite acute fractures.No acute cardiopulmonary findings.  PELVIS, FINDINGS/IMPRESSION: Both hips are normally located. Mild degenerative changes bilaterally. No acute fracture or plain film evidence of AVN. The pubic symphysis and SI joints are intact. No definite pelvic fractures. Extensive vascular calcifications. IMPRESSION: No acute bony findings.  SHOULDER, IMPRESSION: No definite fracture or dislocation. Stable severe degenerative Changes.  CT abdomen/pelvis 11/22/2017: IMPRESSION: High-grade small bowel obstruction in the mid ileum. Specific etiology not determined.  Moderate to large amount of fecal matter throughout the colon. Nasogastric tube in the stomach. Stomach is full.  No acute abdominal organ pathology. Diffuse soft tissue edema, worsened since the previous exam. Collapsed IVC suggesting low intravascular volume.  Portable abdomen, 1 view (gastrograffin study after development of SBO) IMPRESSION: Gaseous distention of small bowel consistent with obstruction. Contrast material in the colon suggesting partial obstruction. Enteric tube tip in the right upper quadrant consistent with location in the distal stomach.  Discharge Instructions: Discharge Instructions    Call MD for:  difficulty breathing, headache or visual disturbances   Complete by:  As directed    Call MD for:  persistant dizziness or light-headedness   Complete by:  As directed    Call MD for:  persistant nausea  and vomiting   Complete by:  As directed    Call MD for:  temperature >100.4   Complete by:  As directed    Diet - low sodium heart healthy   Complete by:  As directed    Discharge instructions   Complete by:  As directed    You were originally evaluated for falls and altered mental status. Your workup was negative for acute fracture and infection as a cause of your initial symptoms. We think your symptoms are a result of too much pain medication, as this medication made you really sleepy whenever you took it during your hospitalization. Your clinical course was complicated by development of a small bowel obstruction, which was thought to be a result of worsening constipation 2/2 narcotic medication use. Your symptoms resolved with bowel rest and stopping this medication. You will have to take Senakot twice a day to prevent constipation while on narcotic medications. We also switched you to smaller doses of the narcotic medication given your altered mental status and development of a bowel obstruction while taking this medication. Please follow up with your primary care provider after leaving the hospital to discuss your pain medication regimen.   Discharge instructions   Complete by:  As directed    You will also need to follow up with your cardiologist regarding your heart. They would like to see you later in January to discuss a procedure which will be important for preventing strokes in the future. Please follow up with them as scheduled.   Increase activity slowly   Complete by:  As directed       Signed: Thomasene Ripple, MD 11/26/2017, 12:14 PM   Pager: [phone]

## 2017-11-26 NOTE — Progress Notes (Signed)
Report given to [facility]  Staff.

## 2017-11-26 NOTE — Progress Notes (Signed)
Discharged to [facility] via [facility] ambulance. Discharge instructions  And prescription given to ambulance staff. Belongings with pt.

## 2017-11-26 NOTE — Clinical Social Work Note (Signed)
Clinical Social Worker facilitated patient discharge including contacting patient family and facility to confirm patient discharge plans.  Clinical information faxed to facility and family agreeable with plan.  CSW arranged ambulance transport via [facility] to [facility].  RN to call [phone] for report prior to discharge.  Clinical Social Worker will sign off for now as social work intervention is no longer needed. Please consult [place] again if new need arises.  [place], [place] [phone]

## 2017-11-26 NOTE — Clinical Social Work Placement (Signed)
   CLINICAL SOCIAL WORK PLACEMENT  NOTE  Date:  11/26/2017  Patient Details  Name: Amber [name] MRN: [id number] Date of Birth: 06-Oct-1956  Clinical Social Work is seeking post-discharge placement for this patient at the Skilled  Nursing Facility level of care (*CSW will initial, date and re-position this form in  chart as items are completed):      Patient/family provided with [facility] Clinical Social Work Department's list of facilities offering this level of care within the geographic area requested by the patient (or if unable, by the patient's family).  Yes   Patient/family informed of their freedom to choose among providers that offer the needed level of care, that participate in Medicare, Medicaid or managed care program needed by the patient, have an available bed and are willing to accept the patient.      Patient/family informed of [facility]'s ownership interest in [facility] and [facility], as well as of the fact that they are under no obligation to receive care at these facilities.  PASRR submitted to EDS on       PASRR number received on 11/25/17     Existing PASRR number confirmed on       FL2 transmitted to all facilities in geographic area requested by pt/family on 11/25/17     FL2 transmitted to all facilities within larger geographic area on       Patient informed that his/her managed care company has contracts with or will negotiate with certain facilities, including the following:        Yes   Patient/family informed of bed offers received.  Patient chooses bed at [facility])     Physician recommends and patient chooses bed at      Patient to be transferred to [facility]) on 11/26/17.  Patient to be transferred to facility by [facility]     Patient family notified on 11/26/17 of transfer.  Name of family member notified:  [name]     PHYSICIAN Please prepare prescriptions     Additional Comment:     _______________________________________________ Maree Krabbe, LCSW 11/26/2017, 12:27 PM

## 2017-11-26 NOTE — Care Management (Signed)
Pt is in agreement with SNF - plan is for pt to discharge today facilitated by CSW

## 2017-11-26 NOTE — Progress Notes (Signed)
Subjective:  Patient talkative this AM with no acute complaints. Discussed transition to SNF today from hospital and patient amenable to going to SNF in [facility]. Patient requests to eat solid food multiple times today during interview. Patient complains about chronic back pain, no abdominal pain.   Objective:  Vital signs in last 24 hours: Vitals:   11/26/17 0022 11/26/17 0345 11/26/17 0500 11/26/17 0735  BP: (!) 144/88 131/79  137/84  Pulse: 72 70  78  Resp: 14 16  (!) 24  Temp: 98.5 F (36.9 C) 98.4 F (36.9 C)  98.3 F (36.8 C)  TempSrc: Axillary Oral  Oral  SpO2: 100% 100%  100%  Weight: 116 lb 6.5 oz (52.8 kg)  116 lb 6.5 oz (52.8 kg)   Height:       Physical Exam  Constitutional:  Patient sitting in bed in no acute distress. Very talkative this AM  Cardiovascular: Normal rate, regular rhythm and intact distal pulses. Exam reveals no friction rub.  No murmur heard. Respiratory:  No crackles or wheezing appreciated in all lung fields.  GI:  Significant interval improvement in abdominal distension from prior exams. Belly soft and non tender with palpation in all 4 quadrants. Active bowel sounds.  Musculoskeletal: She exhibits no edema (Trace pitting edema at ankles bilaterally, interval improvement from previous exams) or tenderness (1+ pitting edema to knees bilaterally).  Neurological:  Patient awake and alert this AM. Gross movement of all 4 extremities.  Skin: Skin is warm and dry. No rash noted. No erythema.  Psychiatric: She has a normal mood and affect. Her behavior is normal. Judgment normal.   Assessment/Plan:  Ms. 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 She was admitted to the  internal medicine teaching service for management. The specific problems addressed during admission are as follows:  SBO: Continued interval improvement and patient tolerating clear liquid diet with want to advance to full food.  -Surgery consulted, signed off. No outpatient follow up needed. -NG tube out for 48 hours. -Clear liquid diet, will transition back to PO medications  -Continue to monitor -PT/OT following -SW consulted for SNF placement - patient amenable to SNF this AM. Will proceed with plans to discharge to SNF today as patient is clinically stable for discharge.  Chronic back pain: Home regimen includes gabapentin 800 mg TID, Oxycontin 20 mg BID with oxycodone 20mg  QID PRN, and tizanidine 4mg  QID PRN for muscle spasms. Will continue low dose narcotic PRN, with plan to increase while at SNF to 10 mg only if needed. Will write hold parameters on discharge given previous history of somnolence/AMS with narcotics during hospitalization and development of SBO while on high dose  -Gabapentin 800 mg BID -Oxycodone 5 mg q6 hours PRN -Prednisone 15 mg daily -IV Toradol q6h PRN for breakthrough pain -Senna 1-2 pills BID PRN  Hx of HTN: Currently normotensive, hypotension noted on admission likely 2/2 polypharmacy and/or dehydration, as her status improved with fluids and no other intervention.  -Torsemide 10 mg, metoprolol xL 25 mg daily  Seizure disorder: No clinical signs or symptoms of seizures. At home patient takes Keppra 500 mg BID and valproic acid 500mg  BID. - Home Keppra 500 mg BID, valproic acid 500 mg BID  DM2: Problem well controlled at baseline with Hemoglobin A1c 4.7 in 10/2016 (but patient has chronic anemia).  -CBG monitoring -Moderate SSI TID when patient not NPO  Hx of embolic CVA in setting of A-Fib with TEE in 11/2016 that showed large PFO: Patient currently in NSR. Cardiology recommends follow up as outpatient for closure of PFO. On eliquis 5mg  BID for  anti-coagulation, restarting home metoprolol xL 25 mg daily today for HR >100.  -Eliquis 5 mg BID, metoprolol xL 25 mg daily  FEN/GI: -Clear liquid diet, advance as tolerated to regular diet -Discontinue IVF, replace electrolytes as needed -Home Protonix 80 mg daily  VTE Prophylaxis: Eliquis 5 mg BID Code Status: Full  Dispo: Anticipated discharge to SNF of patient's choosing today.  , MD 11/26/2017, 8:33 AM

## 2017-11-26 NOTE — Clinical Social Work Note (Signed)
Pt will go to [facility] in [facility] today--pending d/c summary.Pt and family agreeable. CSW will set up transport at d/c.   [place], [place] [phone]

## 2017-12-06 ENCOUNTER — Institutional Professional Consult (permissible substitution): Payer: Medicaid Other | Admitting: Cardiovascular Disease

## 2017-12-06 IMAGING — CT CT HEAD W/O CM
4 series · 16 of 47 positions shown, 18 images · non-contrast
Comparison: 11/26/2016 CT and prior studies

CLINICAL DATA: 61-year-old female with headache and generalized
weakness for 4 days. Recent fall and head injury.

EXAM:
CT HEAD WITHOUT CONTRAST
TECHNIQUE: Contiguous axial images were obtained from the base of the skull
through the vertex without intravenous contrast.

[Series 3: head wo · axial · 0.39mm/px · z∈[-158,-48]mm · 7 of 30 slices shown, 9 images]
[im 4/30  brain]
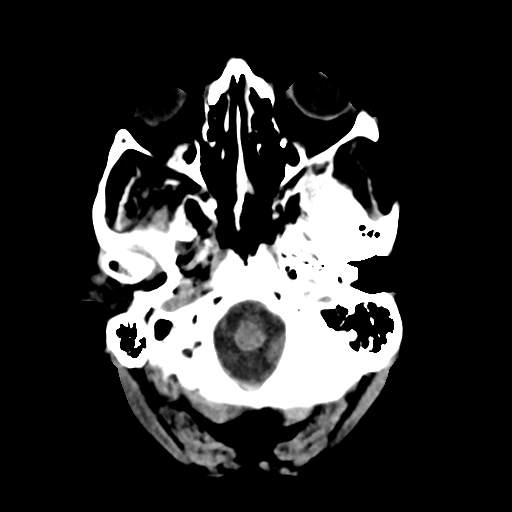
[im 4/30  bone]
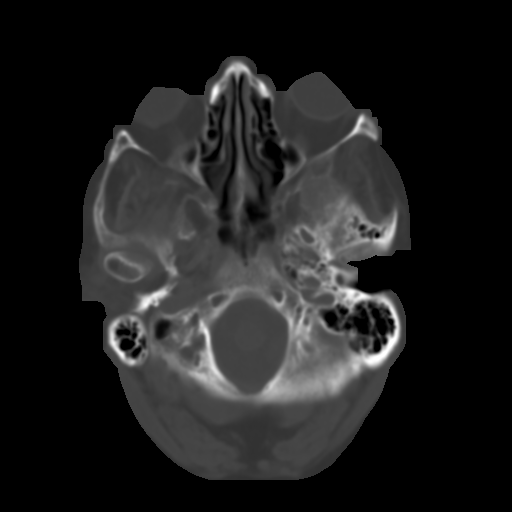
[im 8/30  brain]
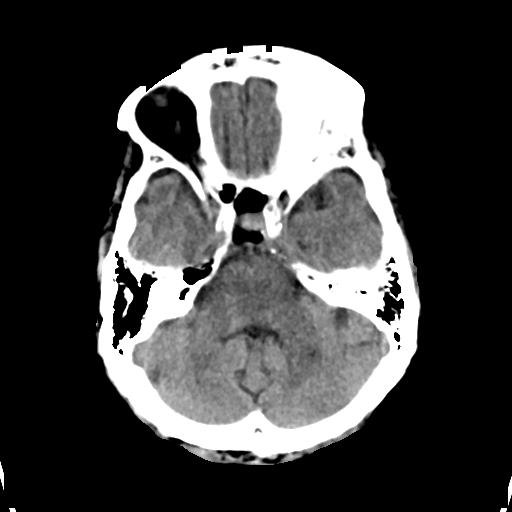
[im 11/30  brain]
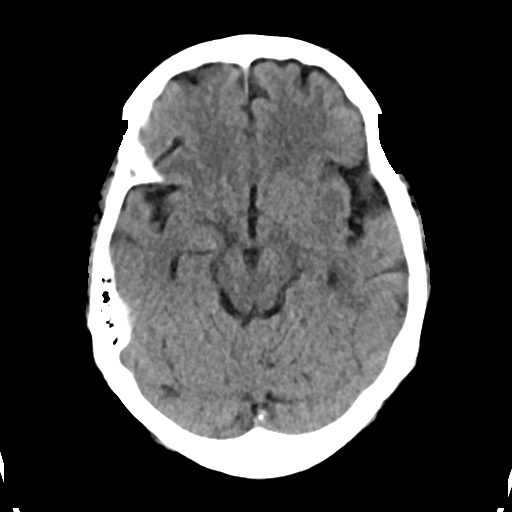
[im 15/30  brain]
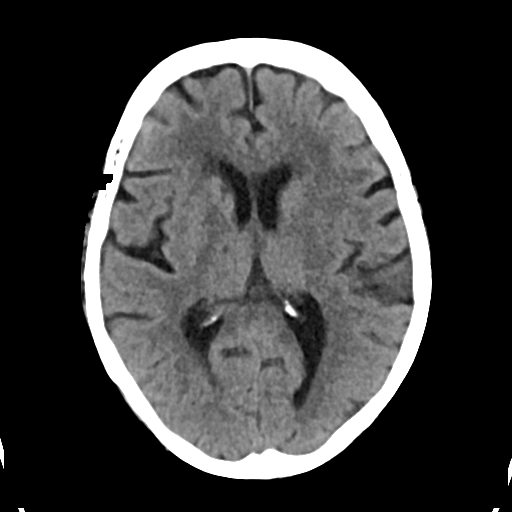
[im 19/30  brain]
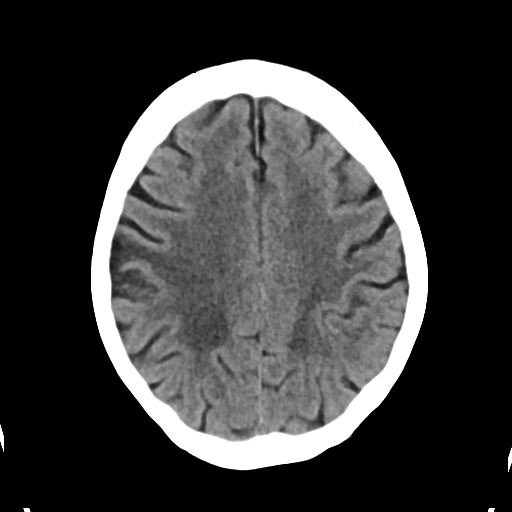
[im 19/30  bone]
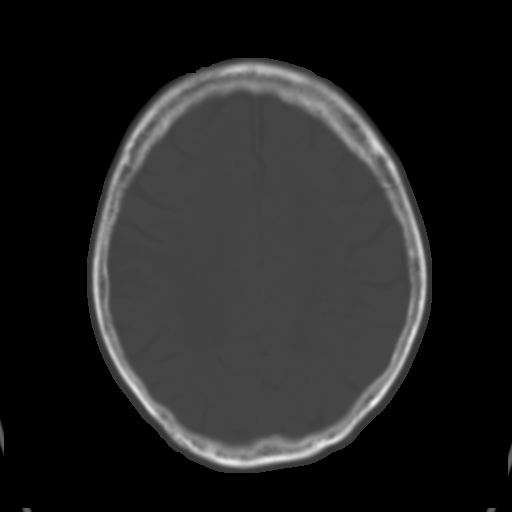
[im 22/30  brain]
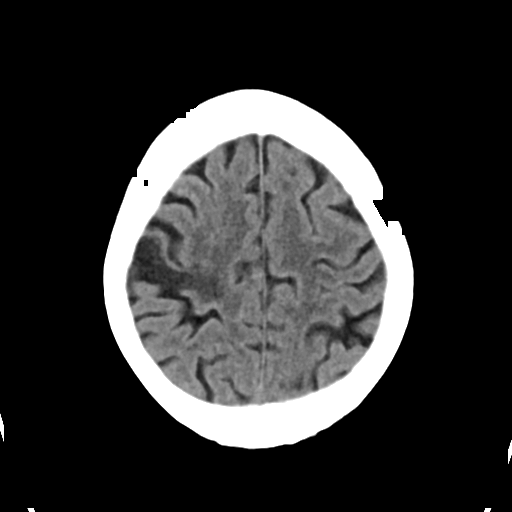
[im 26/30  brain]
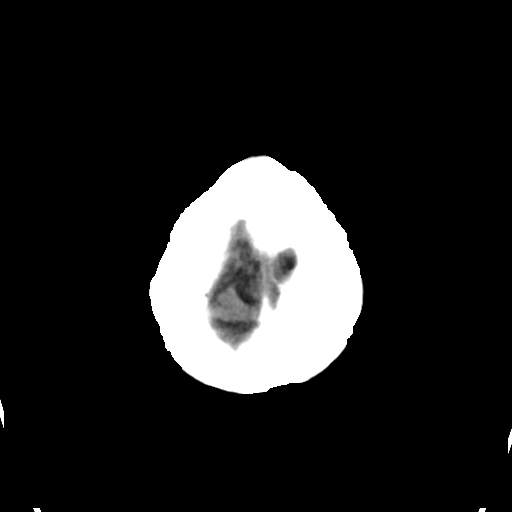

[Series 4: head bone · axial · 0.39mm/px · z∈[-160,-132]mm · 3 of 73 slices shown]
[im 8/73  bone]
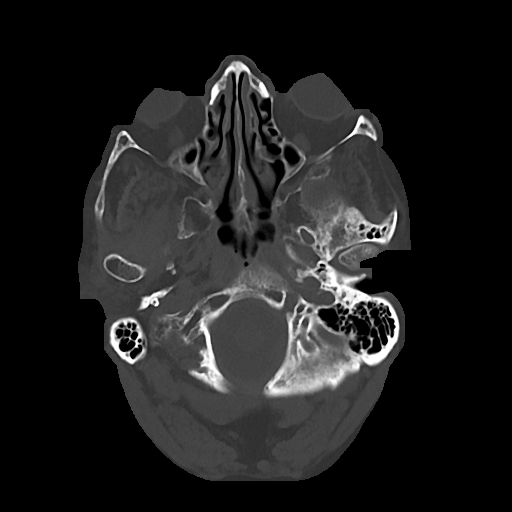
[im 15/73  bone]
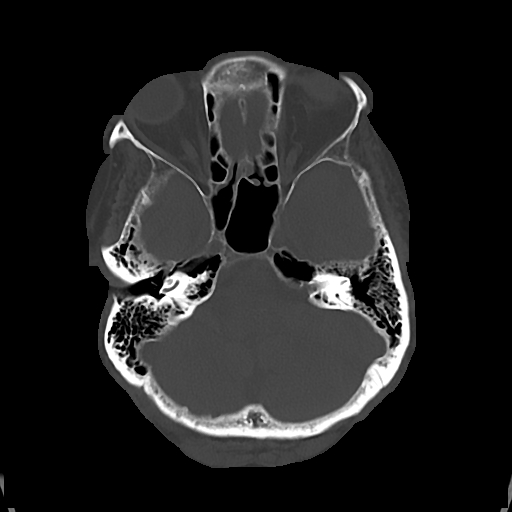
[im 22/73  bone]
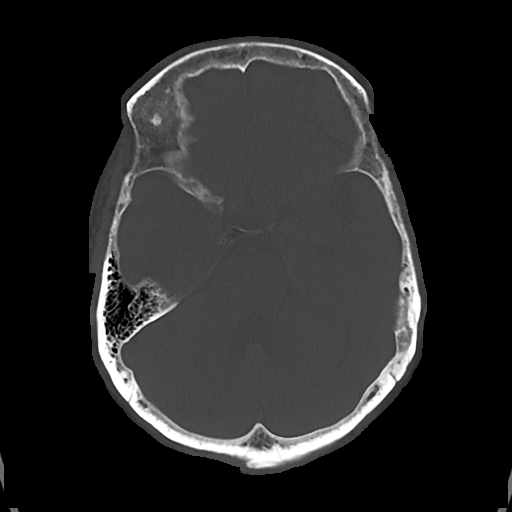

[Series 5: cor soft · coronal · 0.29mm/px · 3 of 58 slices shown]
[im 20/58  brain]
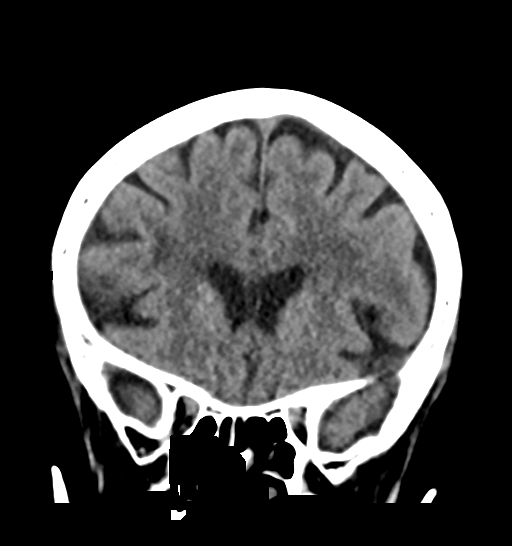
[im 26/58  brain]
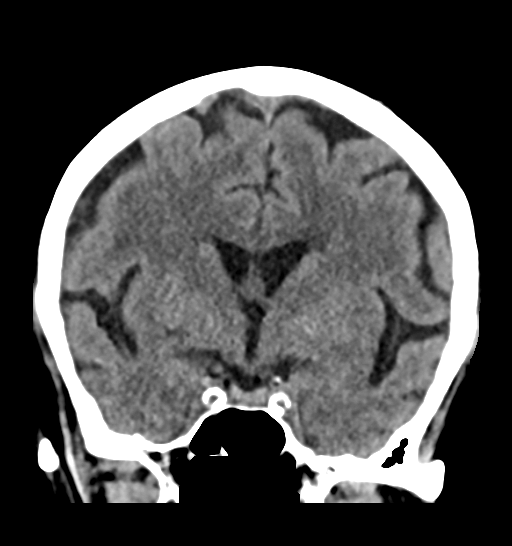
[im 32/58  brain]
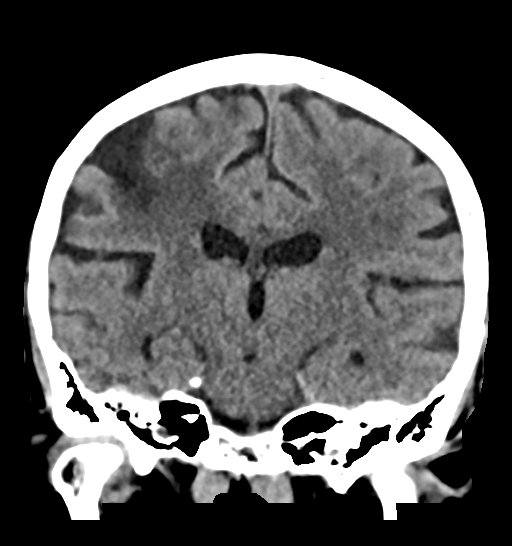

[Series 6: sag soft · sagittal · 0.30mm/px · 3 of 46 slices shown]
[im 16/46  brain]
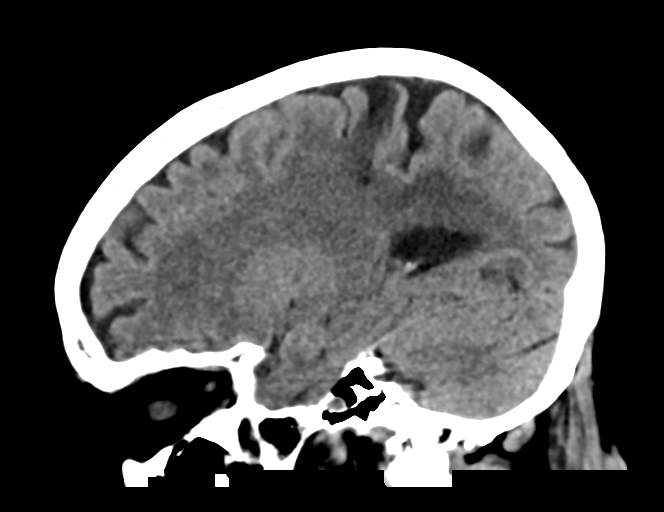
[im 23/46  brain]
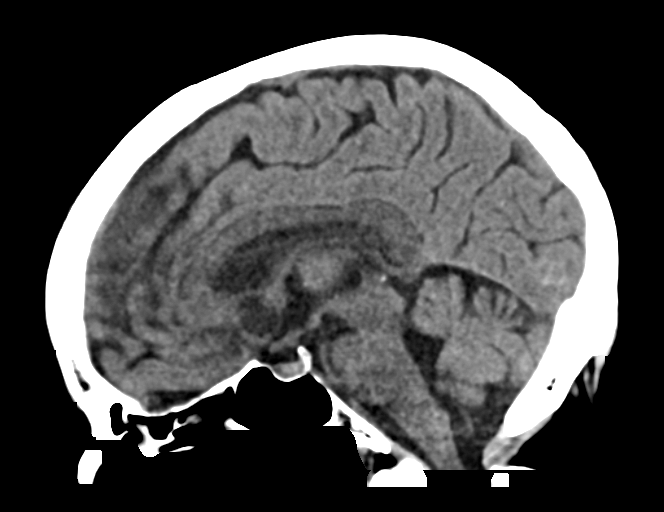
[im 31/46  brain]
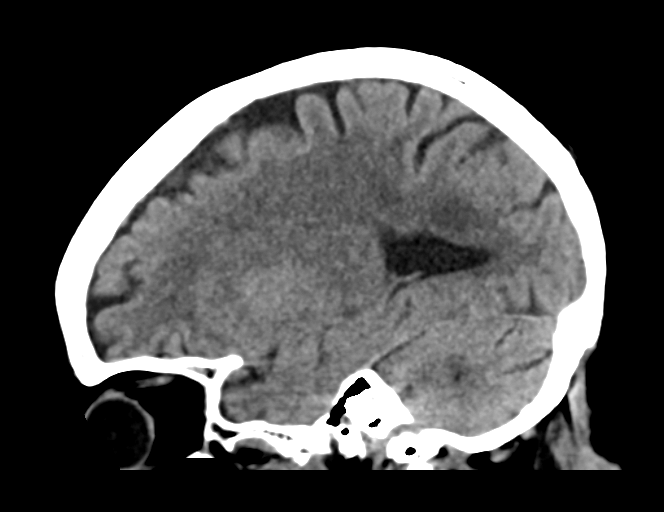

[16 of 47 positions shown; findings below may reference images not displayed]

FINDINGS: Brain: No evidence of acute infarction, hemorrhage, hydrocephalus,
extra-axial collection or mass lesion/mass effect.

Atrophy, chronic small-vessel white matter ischemic changes and
remote right frontoparietal and bilateral basal ganglia infarcts
again noted.

Vascular: Atherosclerotic calcifications noted.

Skull: Normal. Negative for fracture or focal lesion.

Sinuses/Orbits: No acute finding.

Other: None.
IMPRESSION: 1. No evidence of acute intracranial abnormality
2. Atrophy, chronic small-vessel white matter ischemic changes and
remote right frontoparietal and bilateral basal ganglia infarcts.

## 2017-12-06 IMAGING — CT CT ABD-PELV W/O CM
2 of 4 series · 15 of 46 positions shown, 17 images · non-contrast
Comparison: 05/04/2016 chest CT, 07/01/2016 abdomen/pelvic CT and
prior studies.

CLINICAL DATA: 61-year-old female with diffuse chest, abdominal and
pelvic pain for 4 days.

EXAM:
CT CHEST, ABDOMEN AND PELVIS WITHOUT CONTRAST
TECHNIQUE: Multidetector CT imaging of the chest, abdomen and pelvis was
performed following the standard protocol without IV contrast.

[Series 3: cap without · axial · non-contrast · 0.60mm/px · z∈[-697,-252]mm · 12 of 107 slices shown, 14 images]
[im 9/107  soft-tissue]
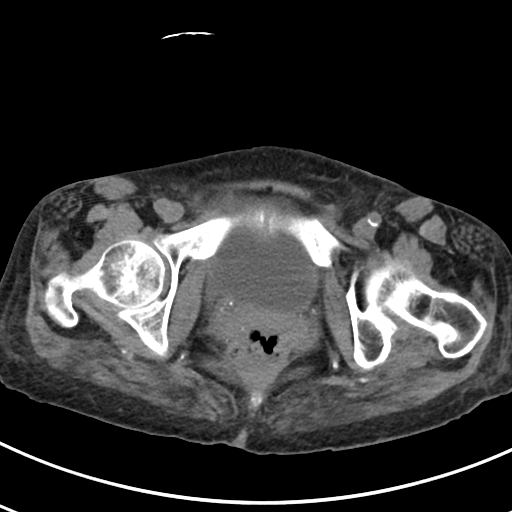
[im 9/107  bone]
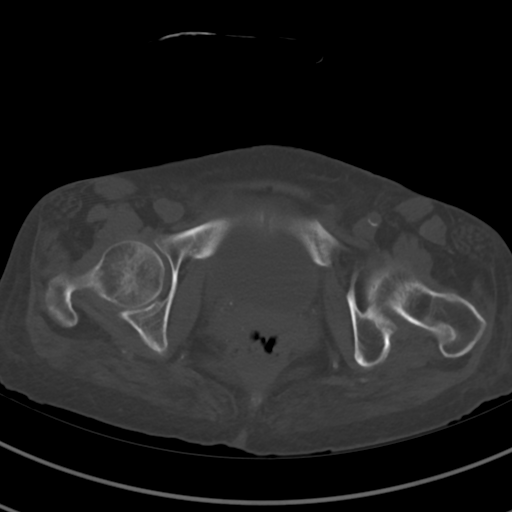
[im 17/107  soft-tissue]
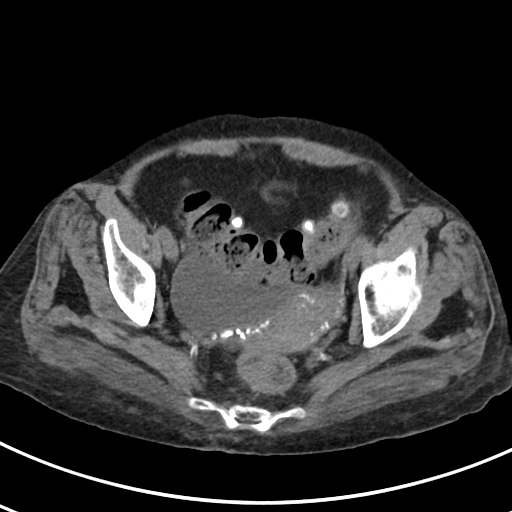
[im 25/107  soft-tissue]
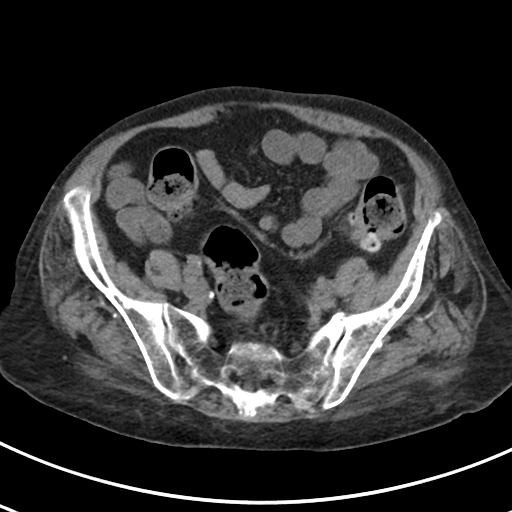
[im 33/107  soft-tissue]
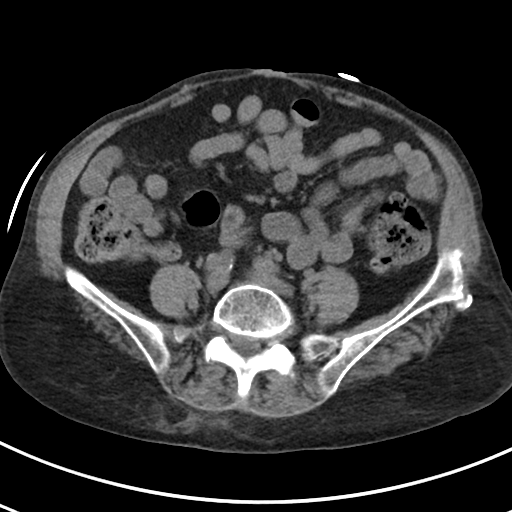
[im 41/107  soft-tissue]
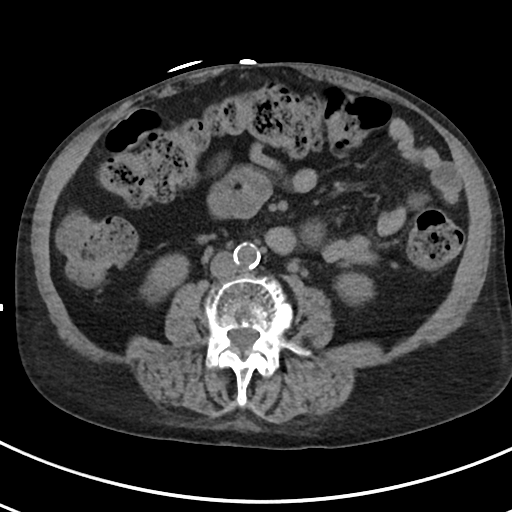
[im 49/107  soft-tissue]
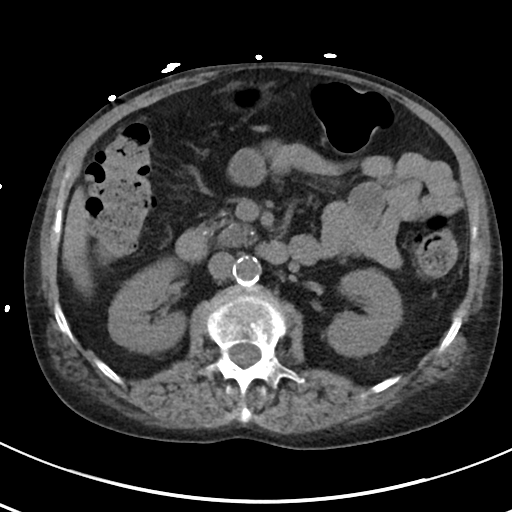
[im 58/107  soft-tissue]
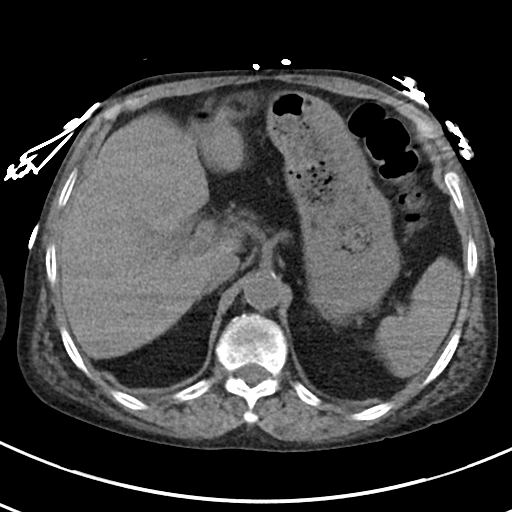
[im 66/107  soft-tissue]
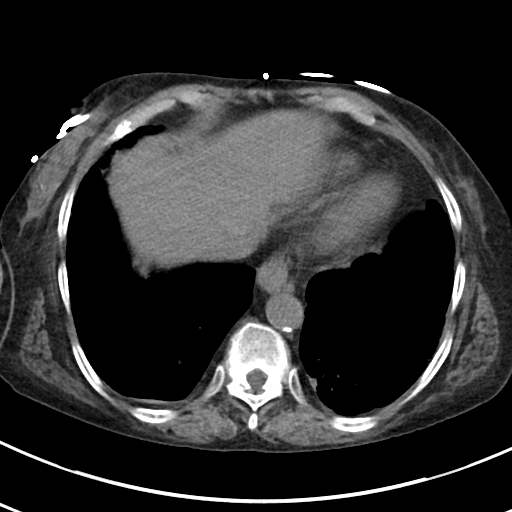
[im 74/107  soft-tissue]
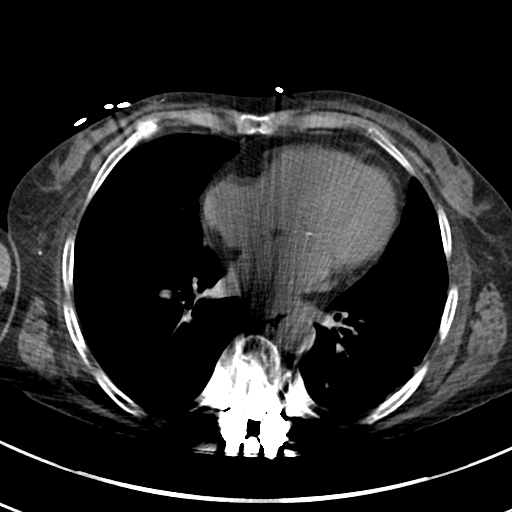
[im 74/107  bone]
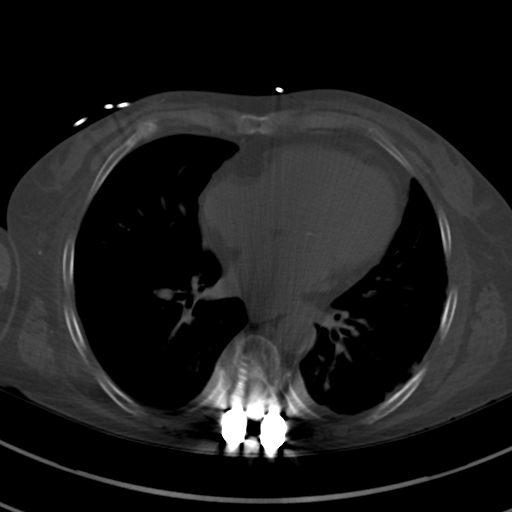
[im 82/107  soft-tissue]
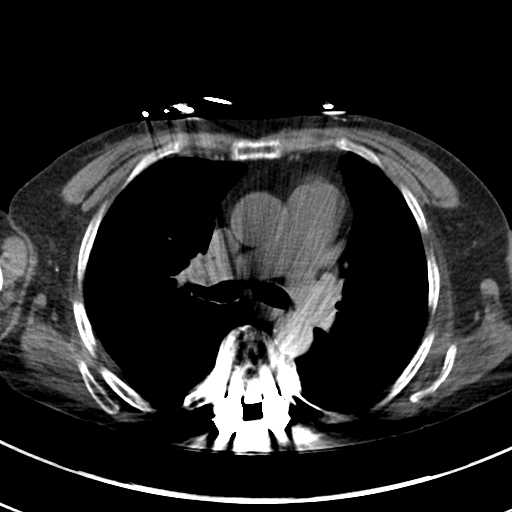
[im 90/107  soft-tissue]
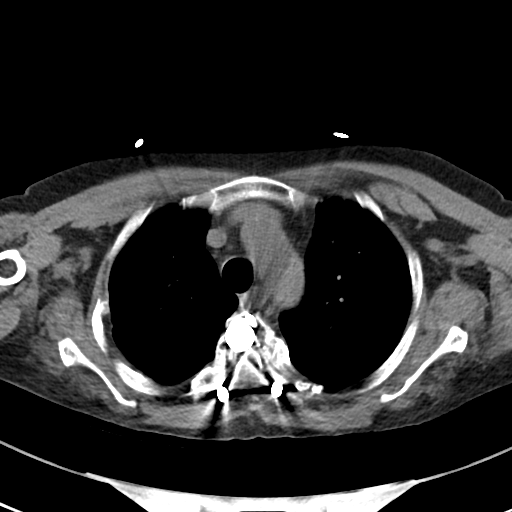
[im 98/107  soft-tissue]
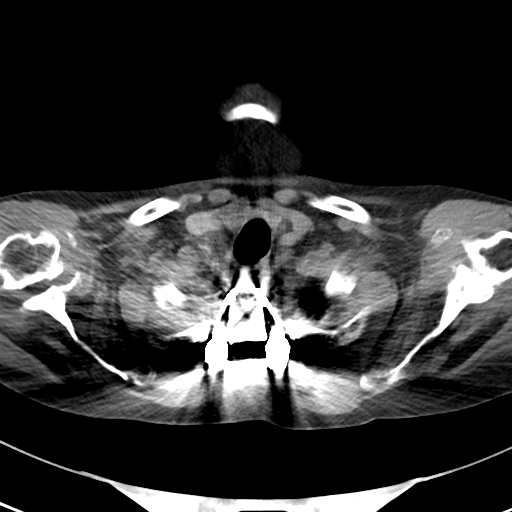

[Series 6: cor · coronal · 0.72mm/px · 3 of 75 slices shown]
[im 25/75  soft-tissue]
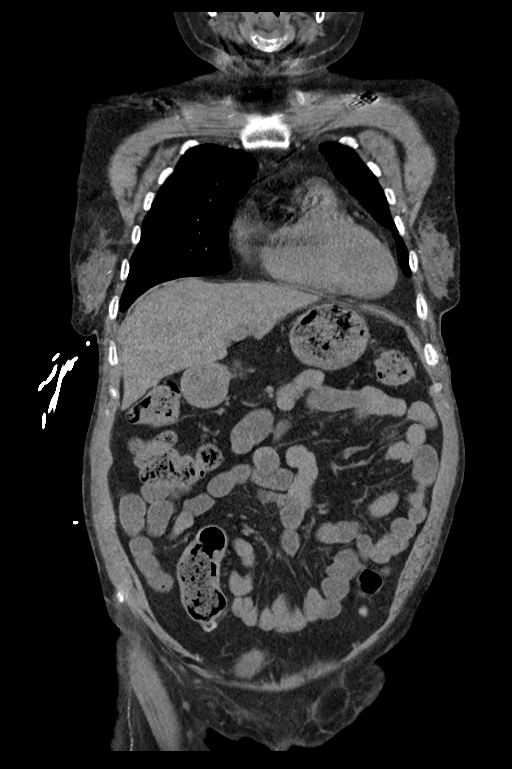
[im 33/75  soft-tissue]
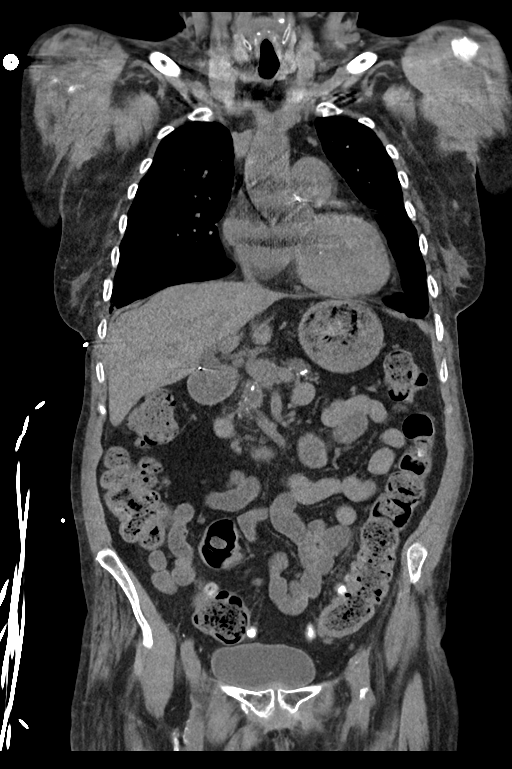
[im 42/75  soft-tissue]
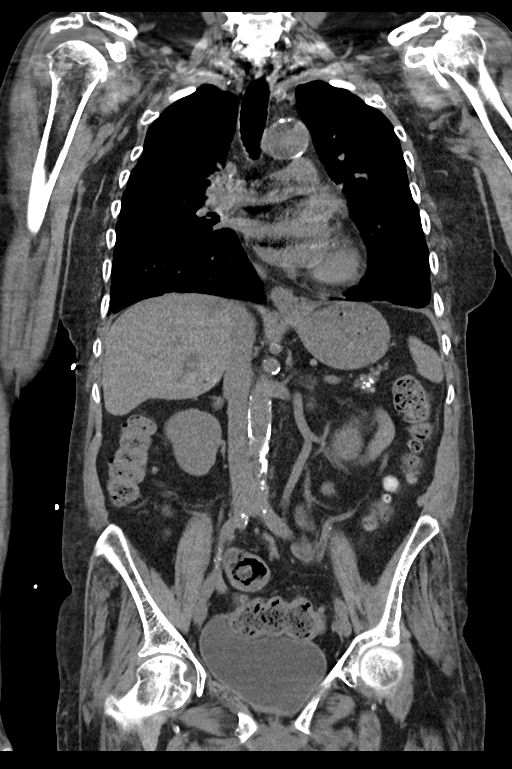

[15 of 46 positions shown; findings below may reference images not displayed]

FINDINGS: Please note that parenchymal abnormalities may be missed without
intravenous contrast.

CT CHEST FINDINGS

Cardiovascular: Normal heart size. Coronary artery and aortic
atherosclerotic calcifications noted. There is no evidence of
thoracic aortic aneurysm or pericardial effusion.

Mediastinum/Nodes: No enlarged mediastinal, hilar, or axillary lymph
nodes. Thyroid gland, trachea, and esophagus demonstrate no
significant findings.

Lungs/Pleura: Bibasilar scarring again noted. There is no evidence
of nodule, airspace disease, consolidation, mass, pleural effusion
or pneumothorax.

Musculoskeletal: No acute abnormality. Thoracic spine surgical
changes, remote bilateral rib fractures and degenerative changes in
both shoulders again noted.

CT ABDOMEN PELVIS FINDINGS

Hepatobiliary: The liver is unremarkable. The patient is status post
cholecystectomy. No biliary dilatation.

Pancreas: Pancreatic calcifications again noted without acute
inflammation or other abnormality.

Spleen: Unremarkable

Adrenals/Urinary Tract: The kidneys, adrenal glands and bladder are
unremarkable.

Stomach/Bowel: Stomach is within normal limits. No evidence of bowel
wall thickening, distention, or inflammatory changes. Colonic
diverticulosis noted without evidence of diverticulitis.

Vascular/Lymphatic: Aortic atherosclerosis. No enlarged abdominal or
pelvic lymph nodes.

Reproductive: Uterus and bilateral adnexa are unremarkable.

Other: No ascites, pneumoperitoneum or focal collection. A small
left inguinal hernia containing fat again noted.

Musculoskeletal: No acute abnormality. T12, L1 and L3 compression
fractures are again identified
IMPRESSION: 1. No evidence of acute abnormality
2. Chronic calcific pancreatitis.  No acute findings.
3.  Aortic Atherosclerosis ([id number]).

## 2017-12-06 IMAGING — DX DG CHEST 1V PORT
1 series · 1 of 1 positions shown · non-contrast
Comparison: December 18, 2016

CLINICAL DATA: Rib pain.  Abdominal pain.  Swelling of the feet.

EXAM:
PORTABLE CHEST 1 VIEW

[chest ap]
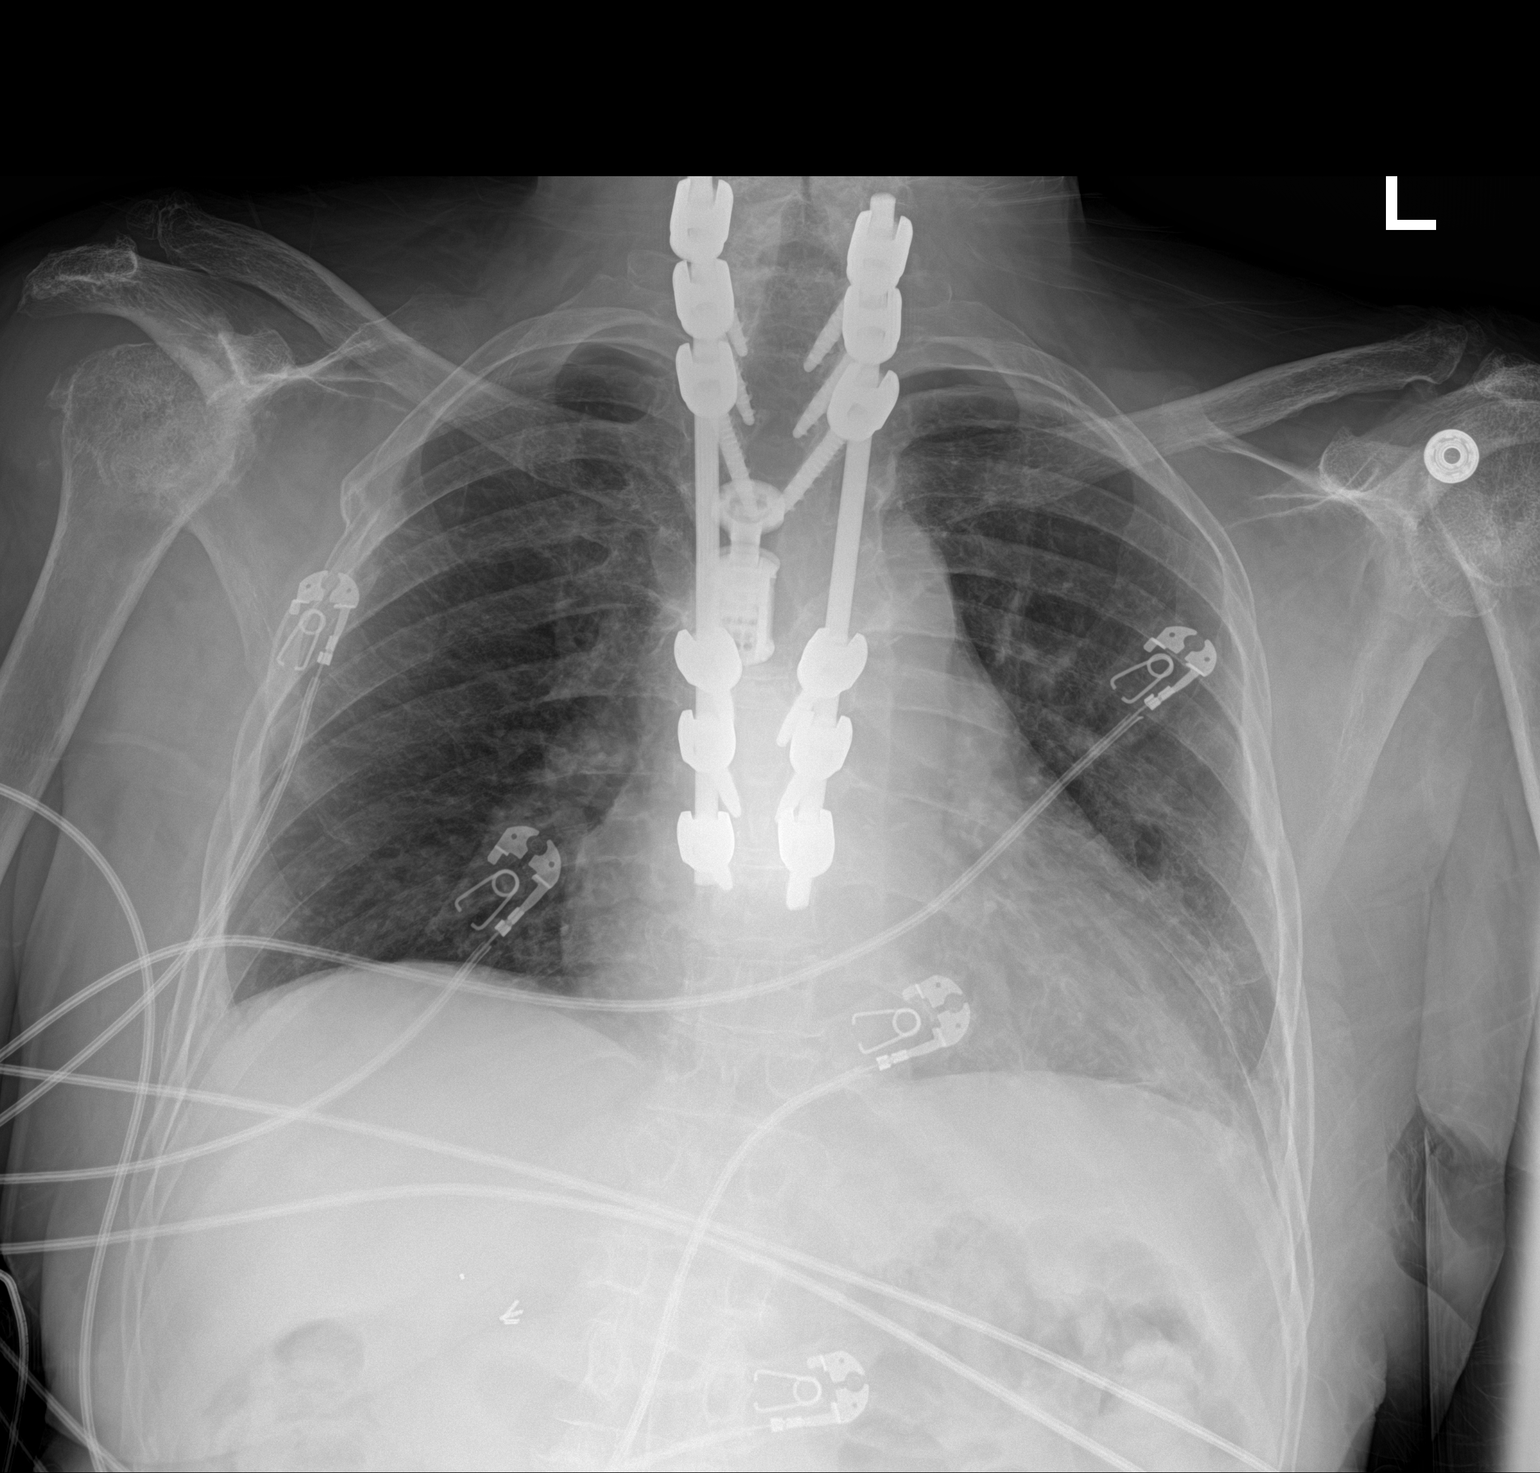

[1 of 1 positions shown; findings below may reference images not displayed]

FINDINGS: Spinal hardware stable. Cardiomegaly. The hila and mediastinum are
normal. Healed right rib fractures. No pneumothorax. No nodule or
mass. Scarring or atelectasis in the left base. Suggested mild
pulmonary venous congestion without overt edema. No other acute
abnormalities.
IMPRESSION: Probable mild pulmonary venous congestion. Probable scarring or
atelectasis in the left base. No other acute abnormalities.

## 2017-12-07 ENCOUNTER — Encounter: Payer: Self-pay | Admitting: Cardiovascular Disease

## 2017-12-07 IMAGING — CR DG THORACIC SPINE 2V
3 series · 3 of 3 positions shown · non-contrast
Comparison: Chest radiograph on 11/23/2016

CLINICAL DATA: Thoracic back pain.  Previous fusion.

EXAM:
THORACIC SPINE 2 VIEWS

[t-spine ap]
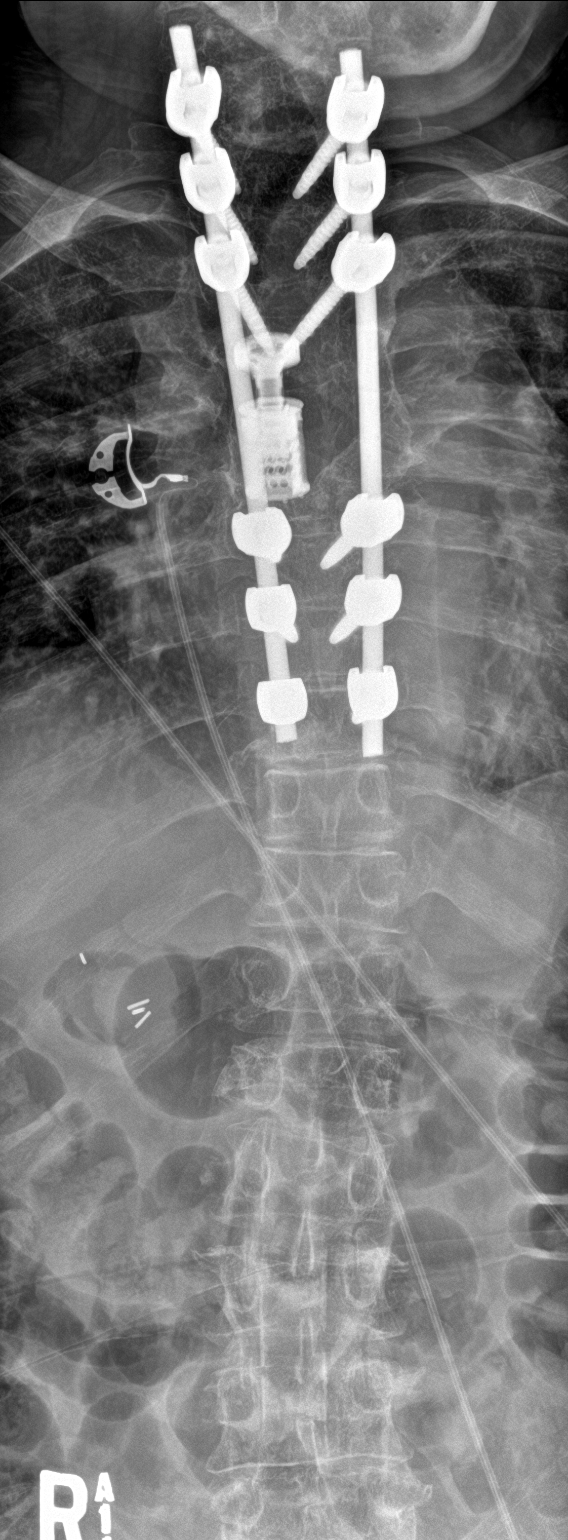

[t-spine lat]
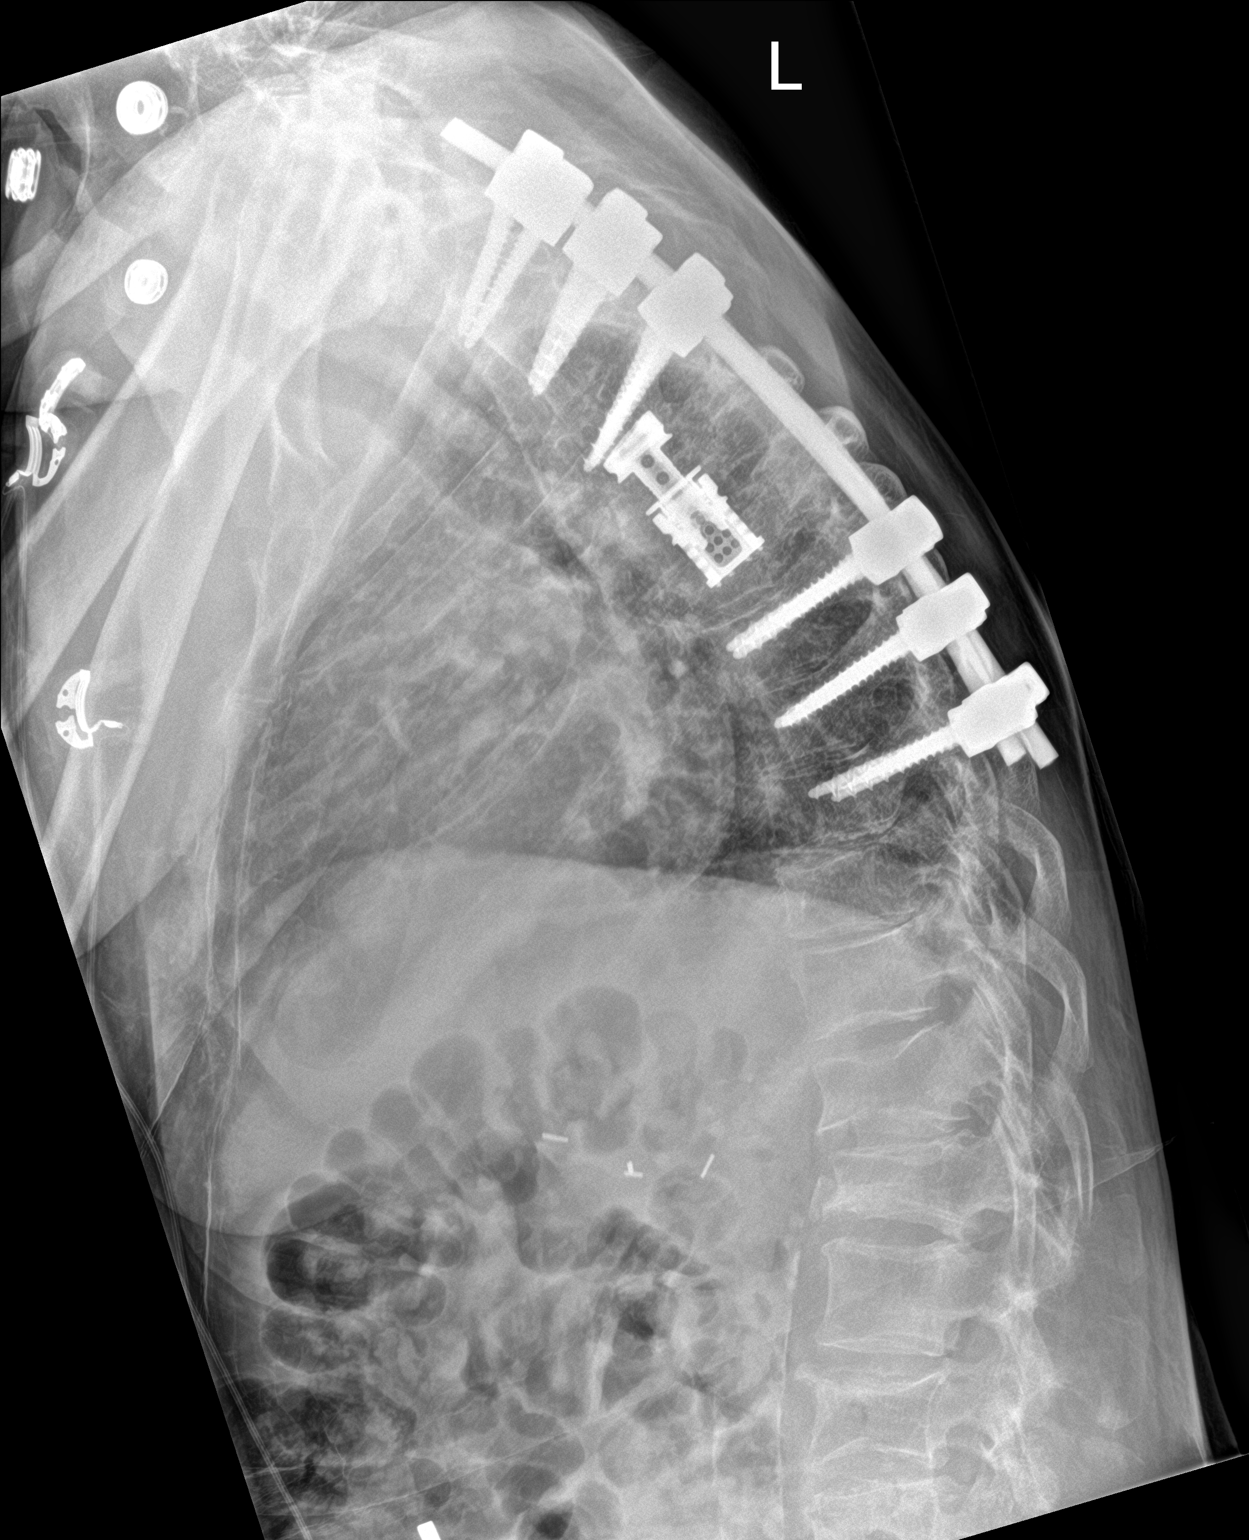

[t-spine swimmers]
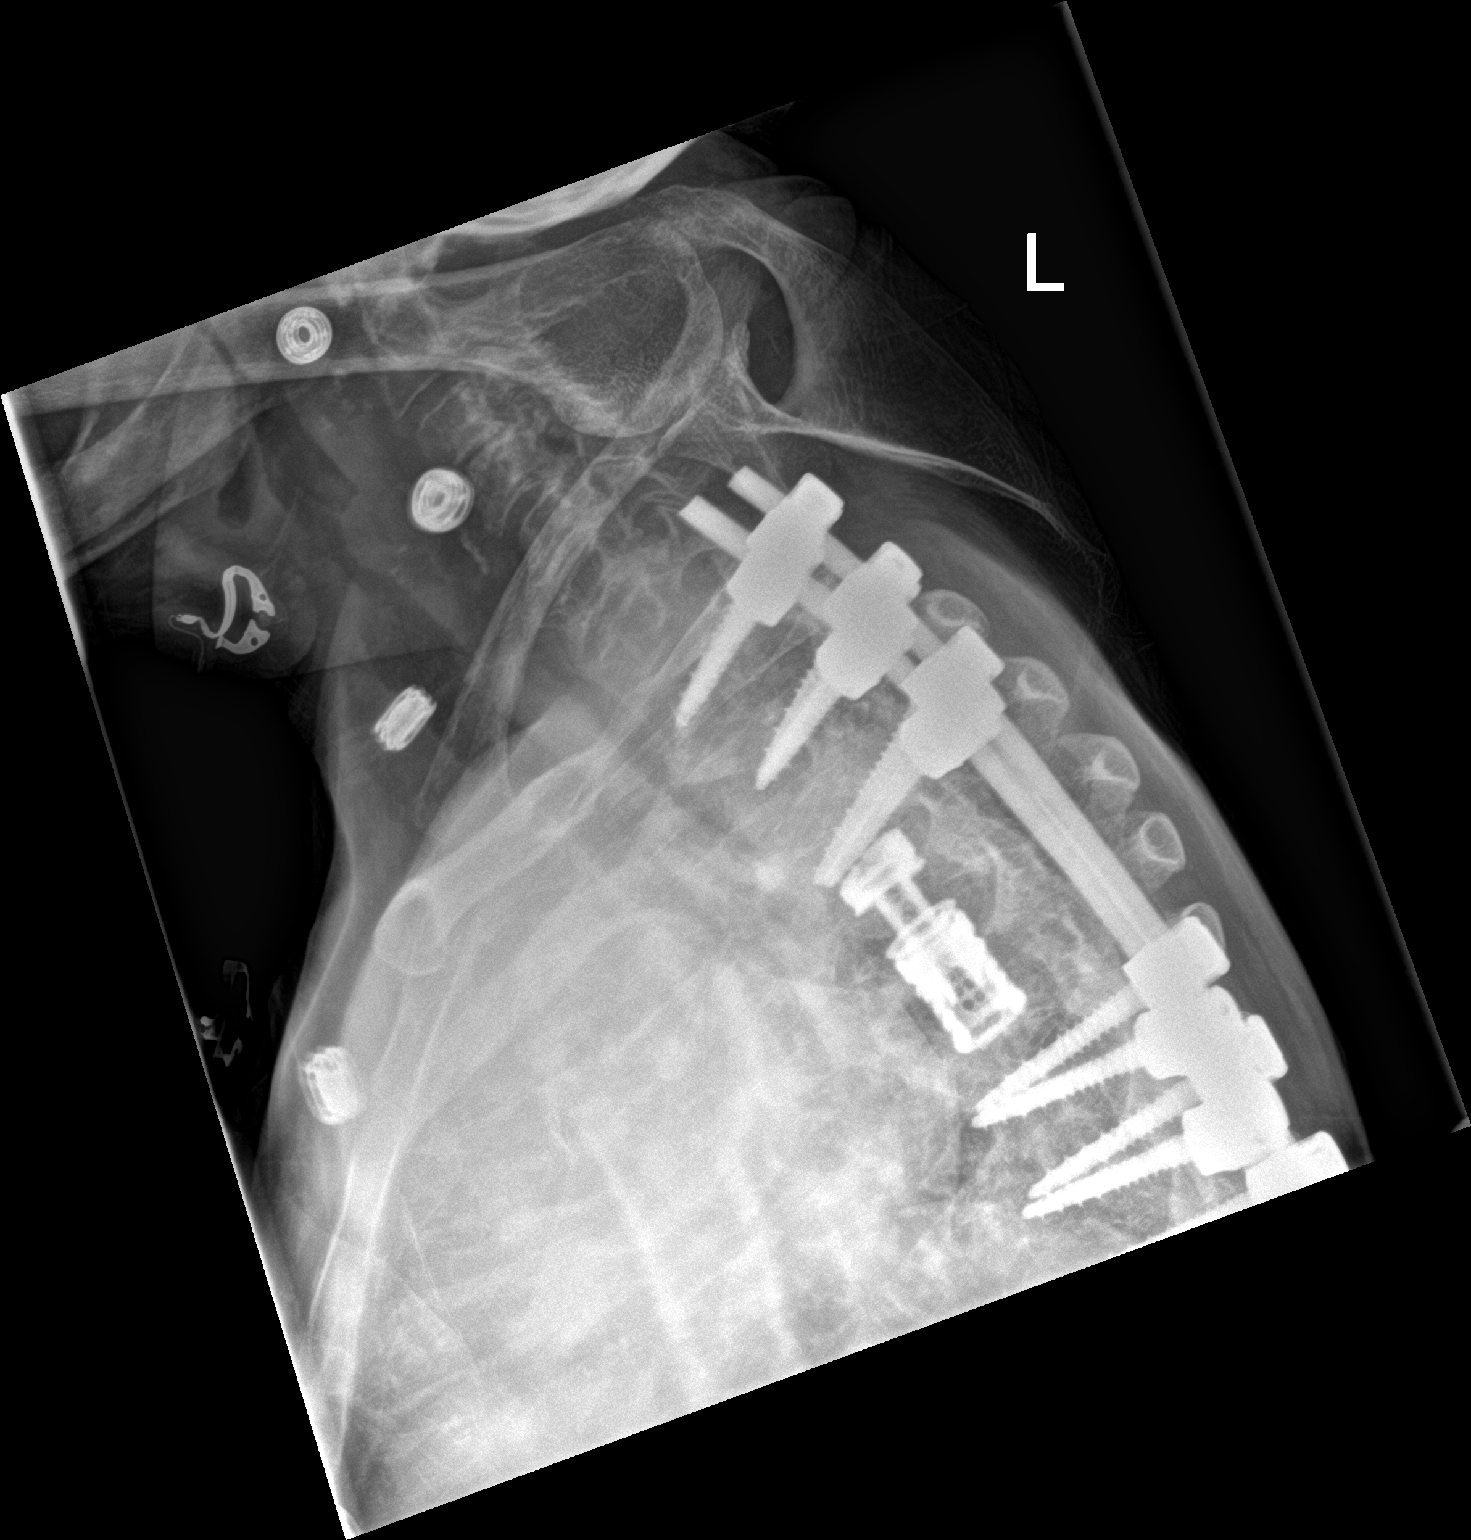

[3 of 3 positions shown; findings below may reference images not displayed]

FINDINGS: Corpectomy cage is again seen at T5. Pedicle screws and posterior
fixation rods are again seen from levels of T2-T9. No evidence of
hardware failure. Alignment is normal. Old fracture deformities are
again seen involving the T10, T12 and L1 vertebral bodies. No
evidence of acute fracture or subluxation. Generalized osteopenia
again noted.
IMPRESSION: No acute findings.

Stable appearance of thoracic spine fusion.

Old osteopenic compression fractures of T10, T12, L1 vertebral
bodies.

## 2017-12-07 IMAGING — CR DG LUMBAR SPINE 2-3V
3 series · 3 of 3 positions shown · non-contrast
Comparison: AP CT on 01/30/2016

CLINICAL DATA: Low back pain.

EXAM:
LUMBAR SPINE - 2-3 VIEW

[l-spine ap]
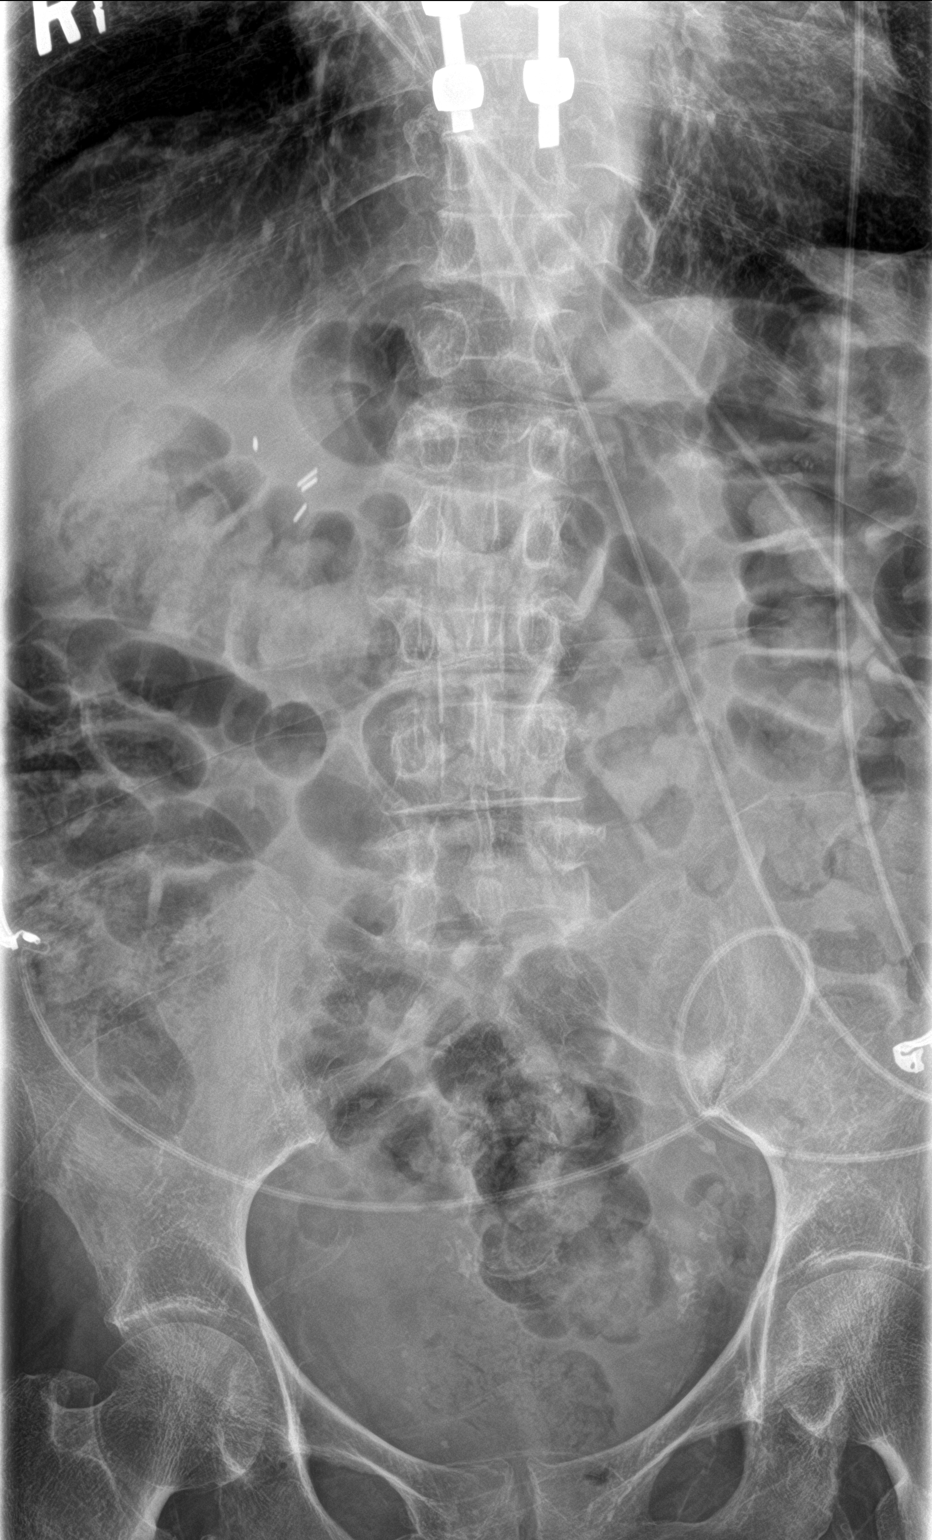

[l-spine lat]
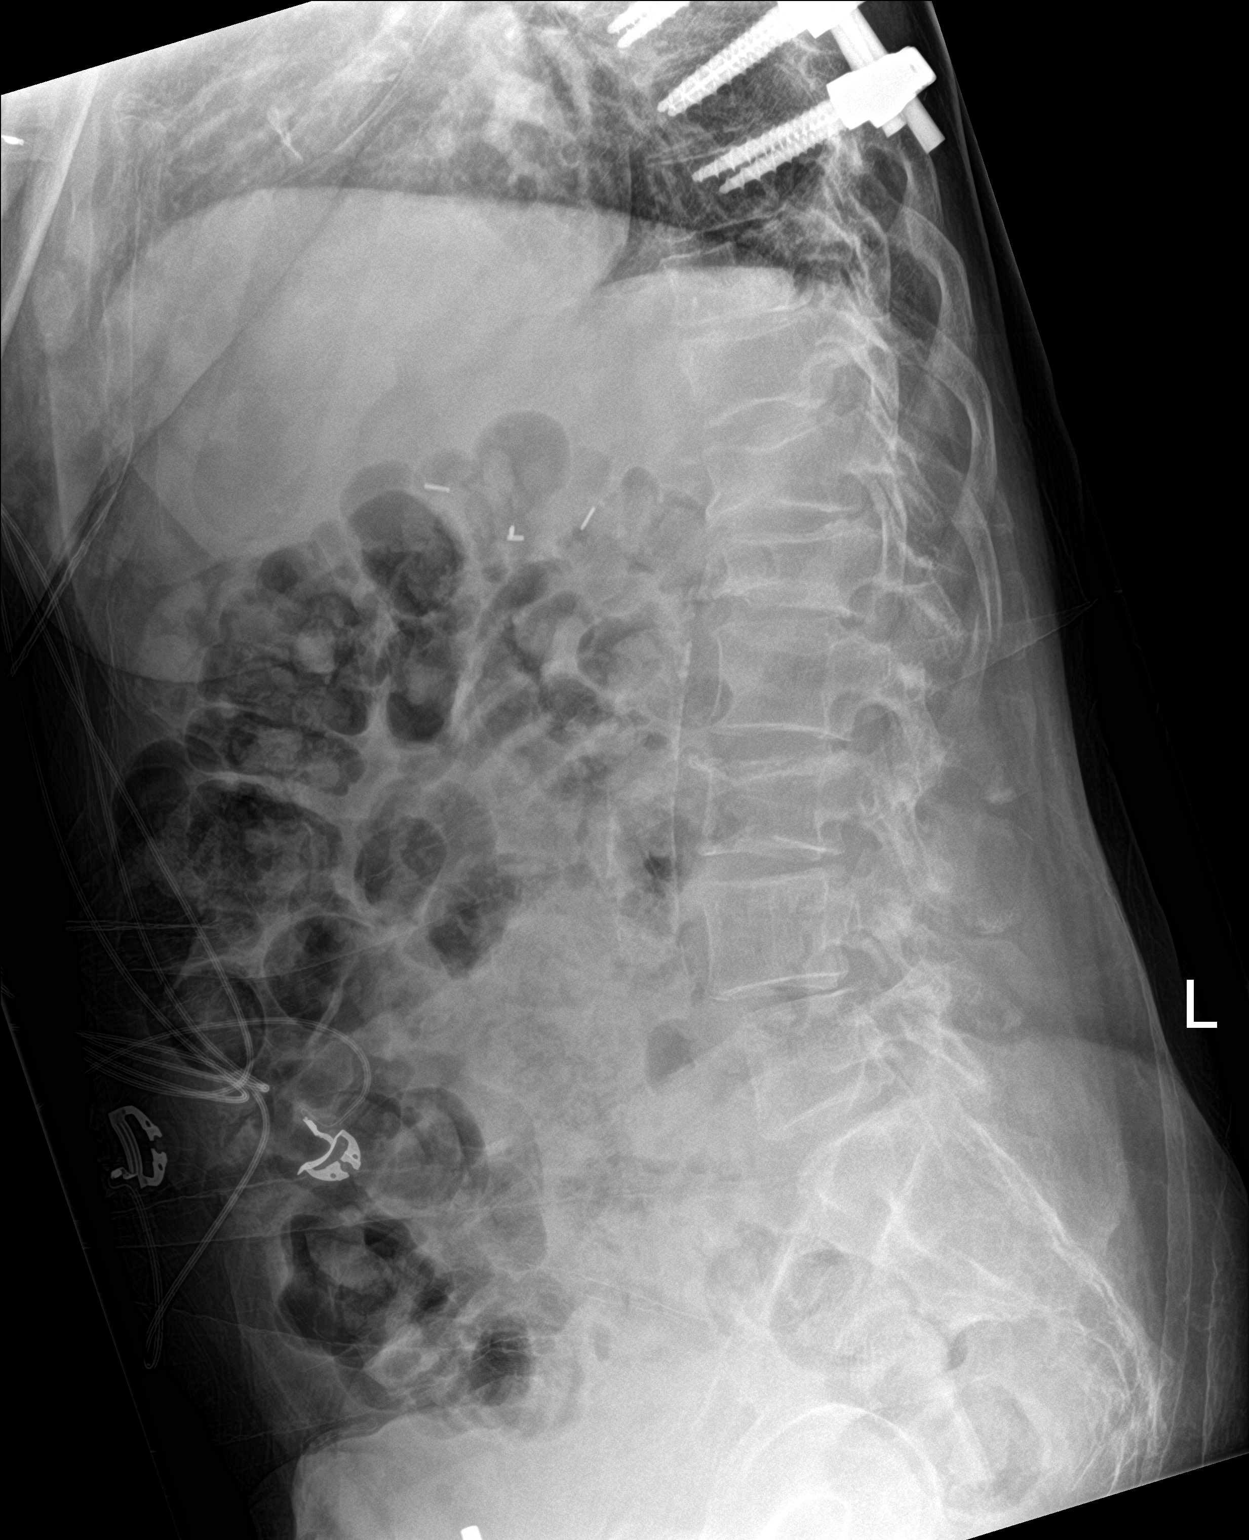

[l-spine spot]
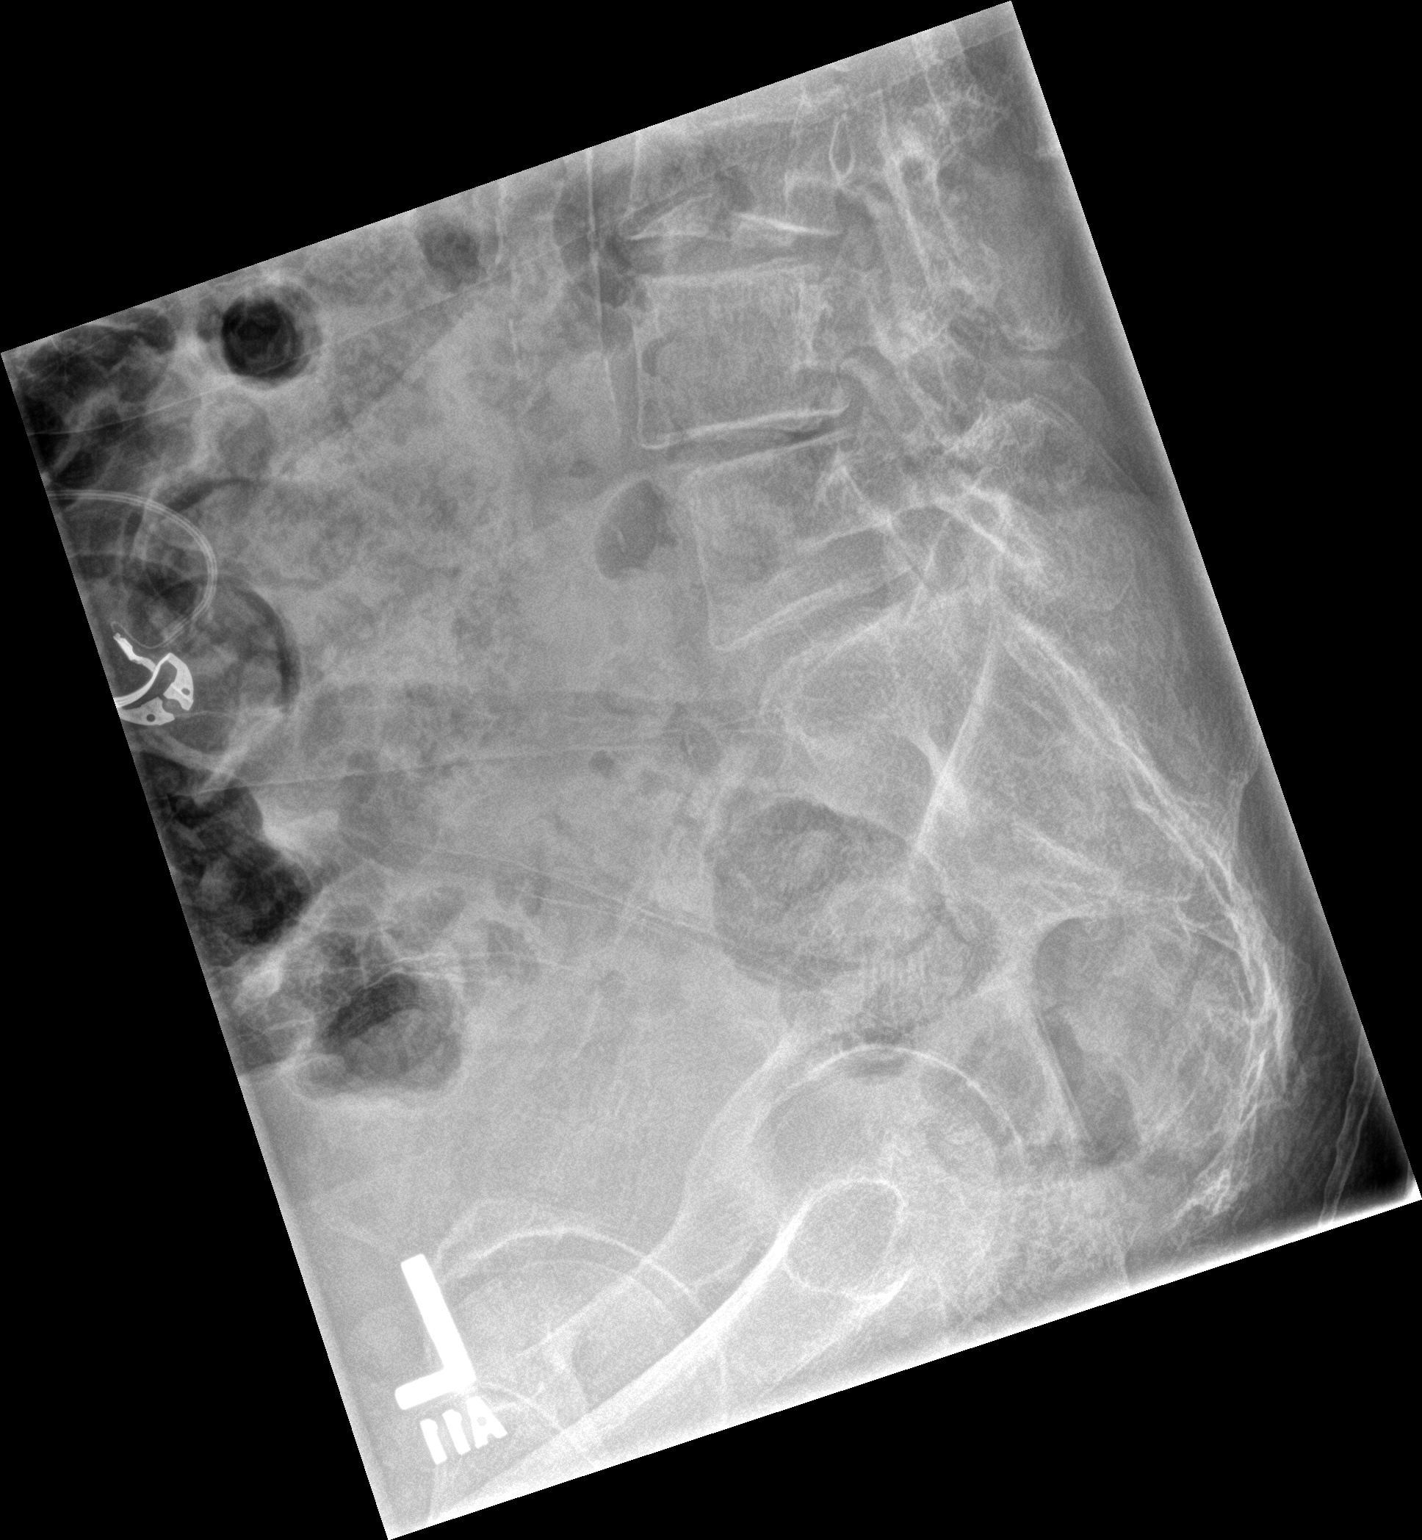

[3 of 3 positions shown; findings below may reference images not displayed]

FINDINGS: Mild superior endplate compression fracture of the L3 vertebral body
is seen, which is of indeterminate age but is new since CT on
07/01/2016. Old compression fracture deformities of the L1 and T12
vertebral bodies are again demonstrated. Diffuse osteopenia again
noted.

Mild L4-5 degenerative disc disease is stable in appearance. Mild
grade 1 anterolisthesis at L4-5 is also unchanged.
IMPRESSION: Mild superior endplate compression fracture of the L3 vertebral body
is of indeterminate age radiographically, but new since 07/01/2016.

Old osteopenic compression fractures of L1 and T12 vertebral bodies.

Stable mild L4-5 degenerative disc disease and grade 1
anterolisthesis.

## 2017-12-08 IMAGING — MR MR THORACIC SPINE W/O CM
5 of 15 series · 17 of 48 positions shown · non-contrast
Comparison: Thoracic spine radiographs 10/21/2017. CTs of the
chest, abdomen and pelvis 10/20/2017.

CLINICAL DATA: 61-year-old with chronic back pain. History of
diabetes, frequent falls and thoracic fusion in 8973 and 1370.

EXAM:
MRI THORACIC AND LUMBAR SPINE WITHOUT CONTRAST
TECHNIQUE: Multiplanar and multiecho pulse sequences of the thoracic and lumbar
spine were obtained without intravenous contrast.

[Series 4: T2 · sagittal · 3.0mm · 0.62mm/px · 2 of 16 slices shown (1 of 5)]
[im 1/16]
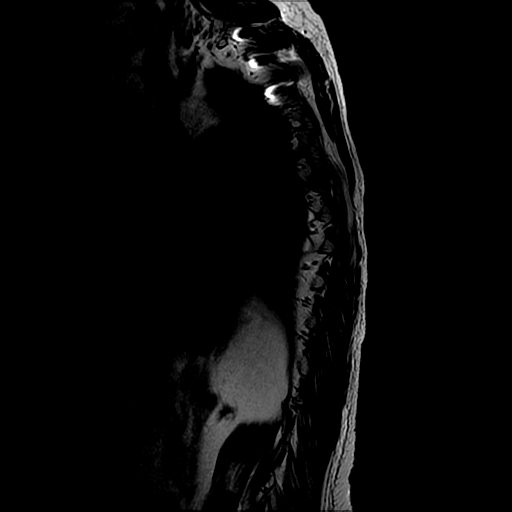
[im 16/16]
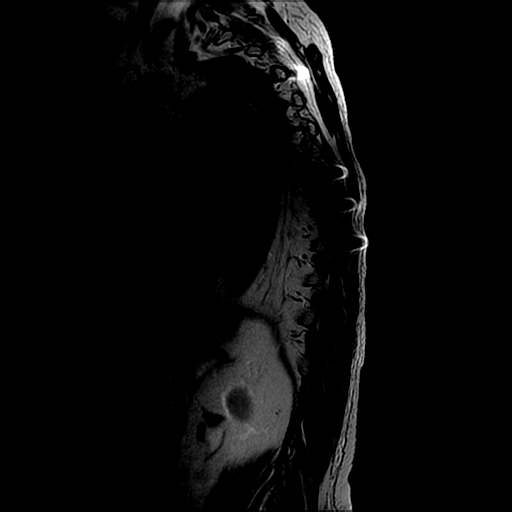

[Series 7: T2 · axial · 4.0mm · 0.43mm/px · z∈[-114,+2]mm · 4 of 25 slices shown (2 of 5)]
[im 1/25]
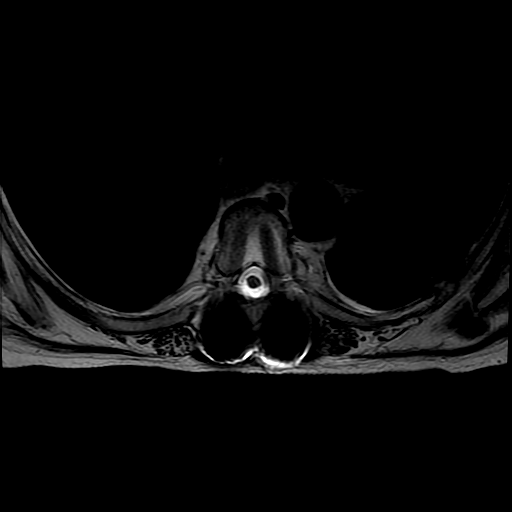
[im 9/25]
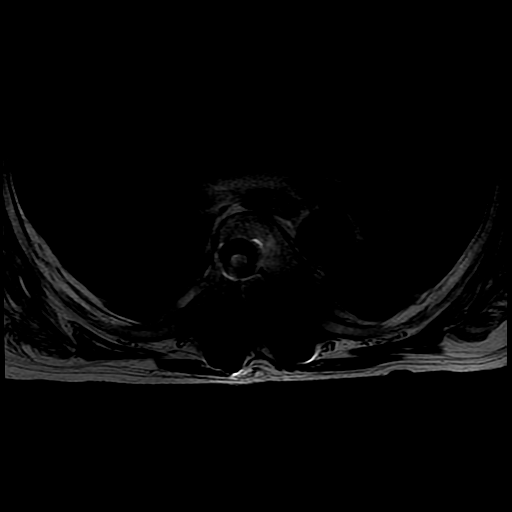
[im 17/25]
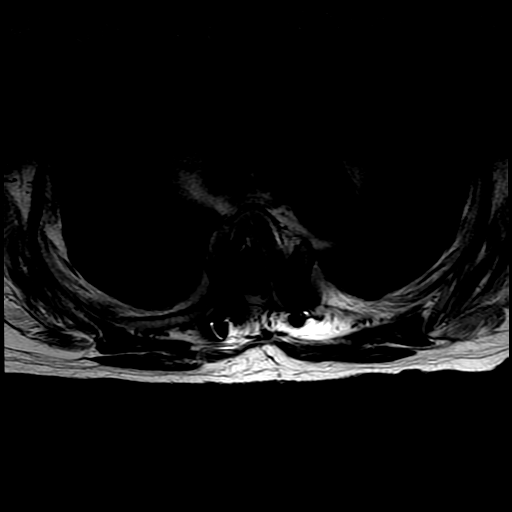
[im 25/25]
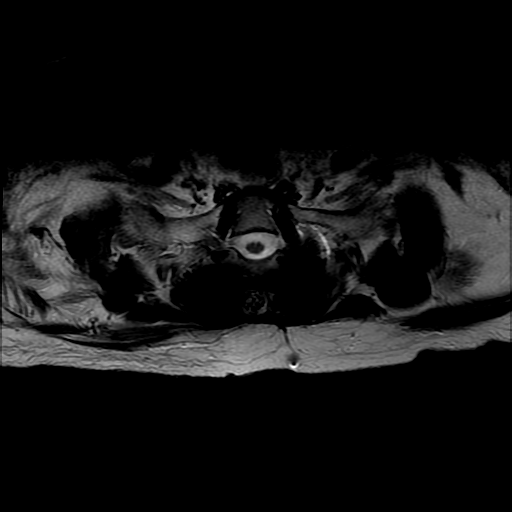

[Series 8: T2 · axial · 4.0mm · 0.43mm/px · z∈[-207,-72]mm · 4 of 28 slices shown (3 of 5)]
[im 1/28]
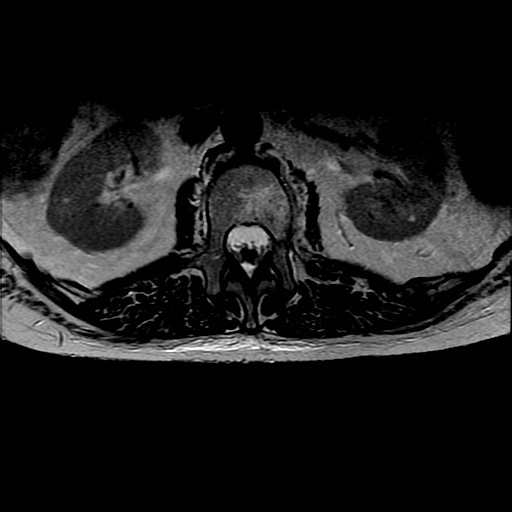
[im 10/28]
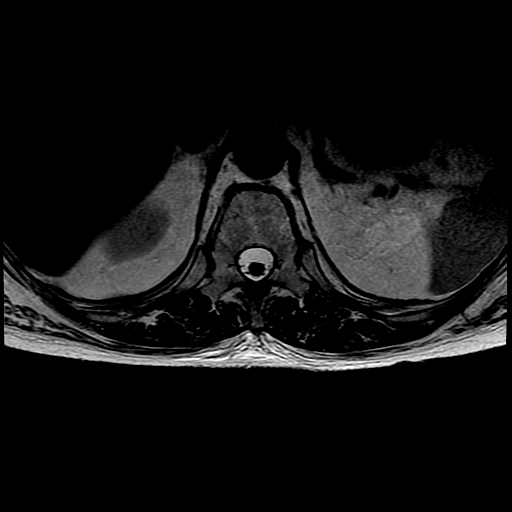
[im 19/28]
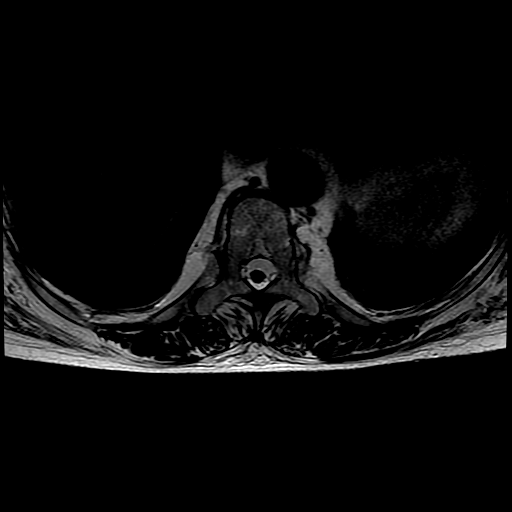
[im 28/28]
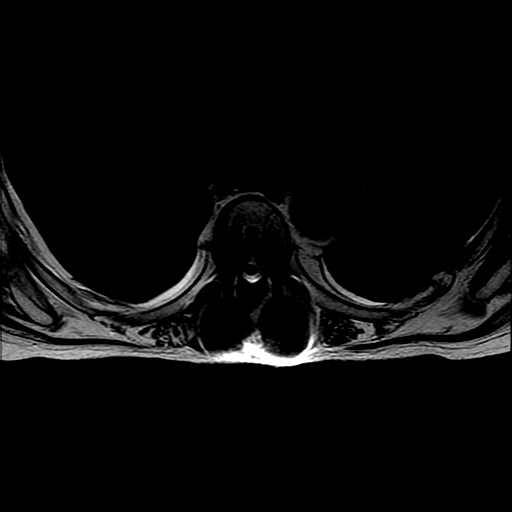

[Series 15: T2 · sagittal · 4.0mm · 0.49mm/px · 2 of 14 slices shown (4 of 5)]
[im 1/14]
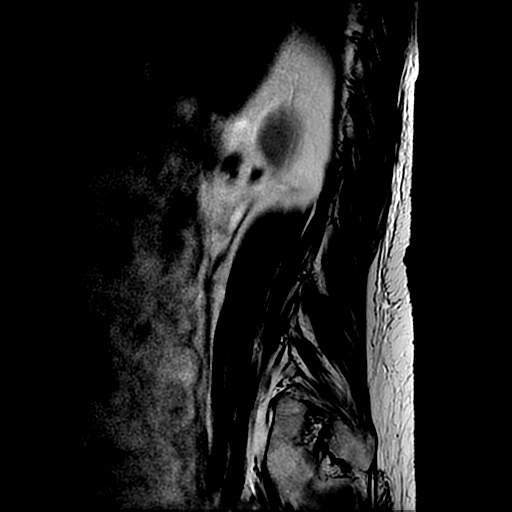
[im 14/14]
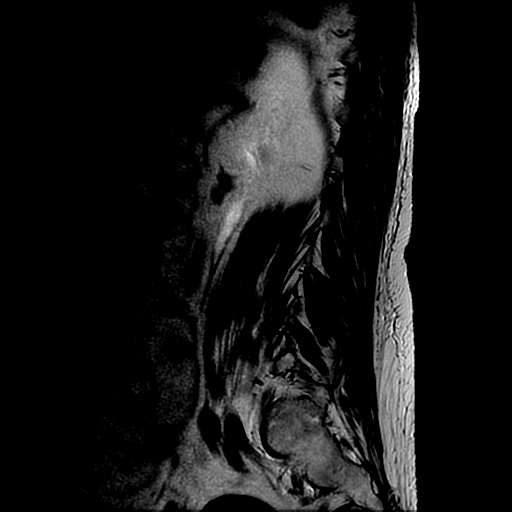

[Series 18: T2 · axial · 4.0mm · 0.39mm/px · z∈[-343,-161]mm · 5 of 32 slices shown (5 of 5)]
[im 1/32]
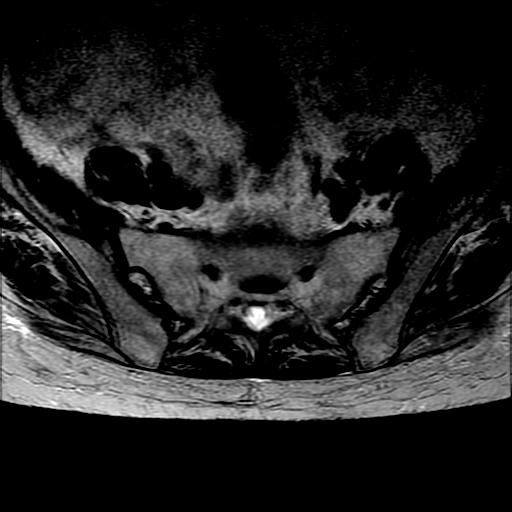
[im 8/32]
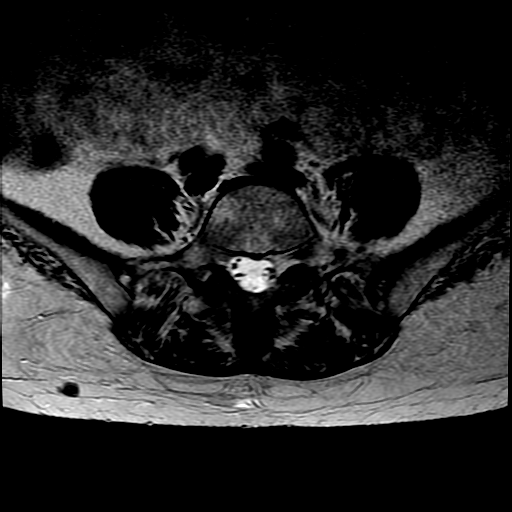
[im 16/32]
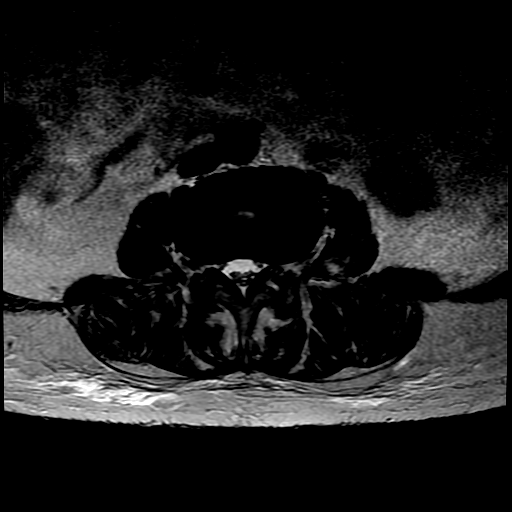
[im 24/32]
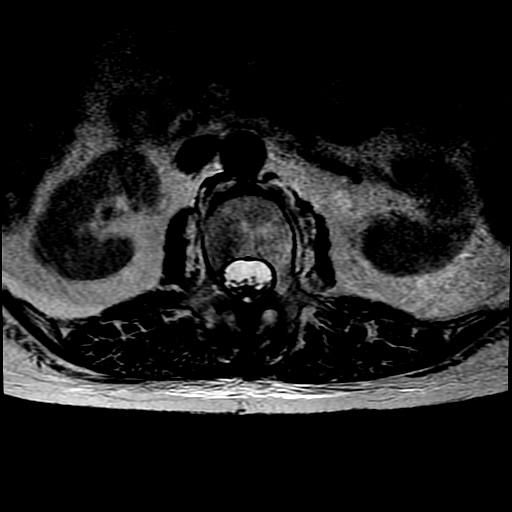
[im 32/32]
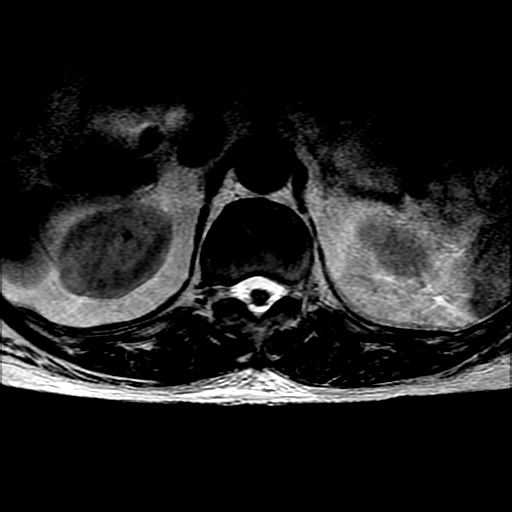

[17 of 48 positions shown; findings below may reference images not displayed]

FINDINGS: MRI THORACIC SPINE FINDINGS

Alignment:  Normal.

Vertebrae: Status post partial corpectomy from T4 through T6 with
posterior fusion from T1 through T9. No evidence of acute fracture
or bone destruction. There is a chronic, healed compression
deformity at T12 without osseous retropulsion.

Cord: Partly obscured by artifact at the level of the corpectomy.
The cord does appear somewhat small at these levels, suggesting
myelomalacia. No cord expansion or abnormal signal seen. The conus
medullaris extends to the L2-3 level and appears normal.

Paraspinal and other soft tissues: No paraspinal abnormalities.

Disc levels:

No large disc herniation, spinal stenosis or nerve root
encroachment. There is mild disc bulging at T9-10 and T10-11.

MRI LUMBAR SPINE FINDINGS

Segmentation:  There are 5 lumbar type vertebral bodies.

Alignment: Arm there is a mild scoliosis and a grade 1 degenerative
anterolisthesis at L4-5.

Vertebrae: As above, there is a chronic, healed T12 compression
fracture. There are progressive fractures at L1 involving the
inferior endplate and at L3, involving both endplates, compared with
the CT from 05/04/2016. These fractures are largely healed and
stable from recent CT. No evidence of acute fracture or focal marrow
lesion. The sacroiliac joints appear normal.

Conus medullaris and cauda equina: Conus extends to the L2-3 level.
Conus and cauda equina appear normal.

Paraspinal and other soft tissues: Unremarkable.

Disc levels:

L1-2: Disc bulging and endplate osteophytes contribute to mild mass
effect on the thecal sac. There is no cord deformity or significant
foraminal compromise.

L2-3: Disc height is relatively preserved. There is mild disc
bulging eccentric to the left, contributing to mild left foraminal
narrowing.

L3-4: Mild disc bulging, facet and ligamentous hypertrophy
contribute to mild narrowing of the lateral recesses and foramina
bilaterally.

L4-5: There is loss of disc height with annular disc bulging
eccentric to the left. There is moderate facet and ligamentous
hypertrophy. These factors contribute to mild to moderate spinal
stenosis and moderate asymmetric narrowing of the left foramen.
There is mild narrowing of the right foramen and both lateral
recesses.

L5-S1: Mild disc bulging and facet hypertrophy. No significant
spinal stenosis or nerve root encroachment.
IMPRESSION: 1. Stable postsurgical changes status post T1 through T9 fusion and
partial corpectomy at T4-6. Suspected chronic myelomalacia at the
level of the corpectomy.
2. Compression deformities at T12, L1 and L3, stable from recent CT.
The lumbar fractures have progressed from 05/04/2016 but appear
healed. No acute osseous findings.
3. Mild to moderate multifactorial spinal stenosis at L4-5 with
asymmetric narrowing of the left foramen and possible left L4 nerve
root encroachment.
4. No other significant spinal stenosis or nerve root encroachment.

## 2017-12-25 ENCOUNTER — Telehealth: Payer: Self-pay

## 2017-12-25 NOTE — Telephone Encounter (Signed)
Pt no showed for 12/06/17 apt with Dr Excell Seltzer to discuss PFO, I attempted to reach the pt by phone but the phone just rings with no answer or voicemail. The pt was previously scheduled to see Dr Excell Seltzer two times but did not come to these appointments either.

## 2018-01-04 IMAGING — DX DG SHOULDER 2+V*R*
2 series · 2 of 2 positions shown · non-contrast
Comparison: Chest x-ray 10/20/2017

CLINICAL DATA: Fell.  Right shoulder pain.

EXAM:
RIGHT SHOULDER - 2+ VIEW

[shoulder grashey]
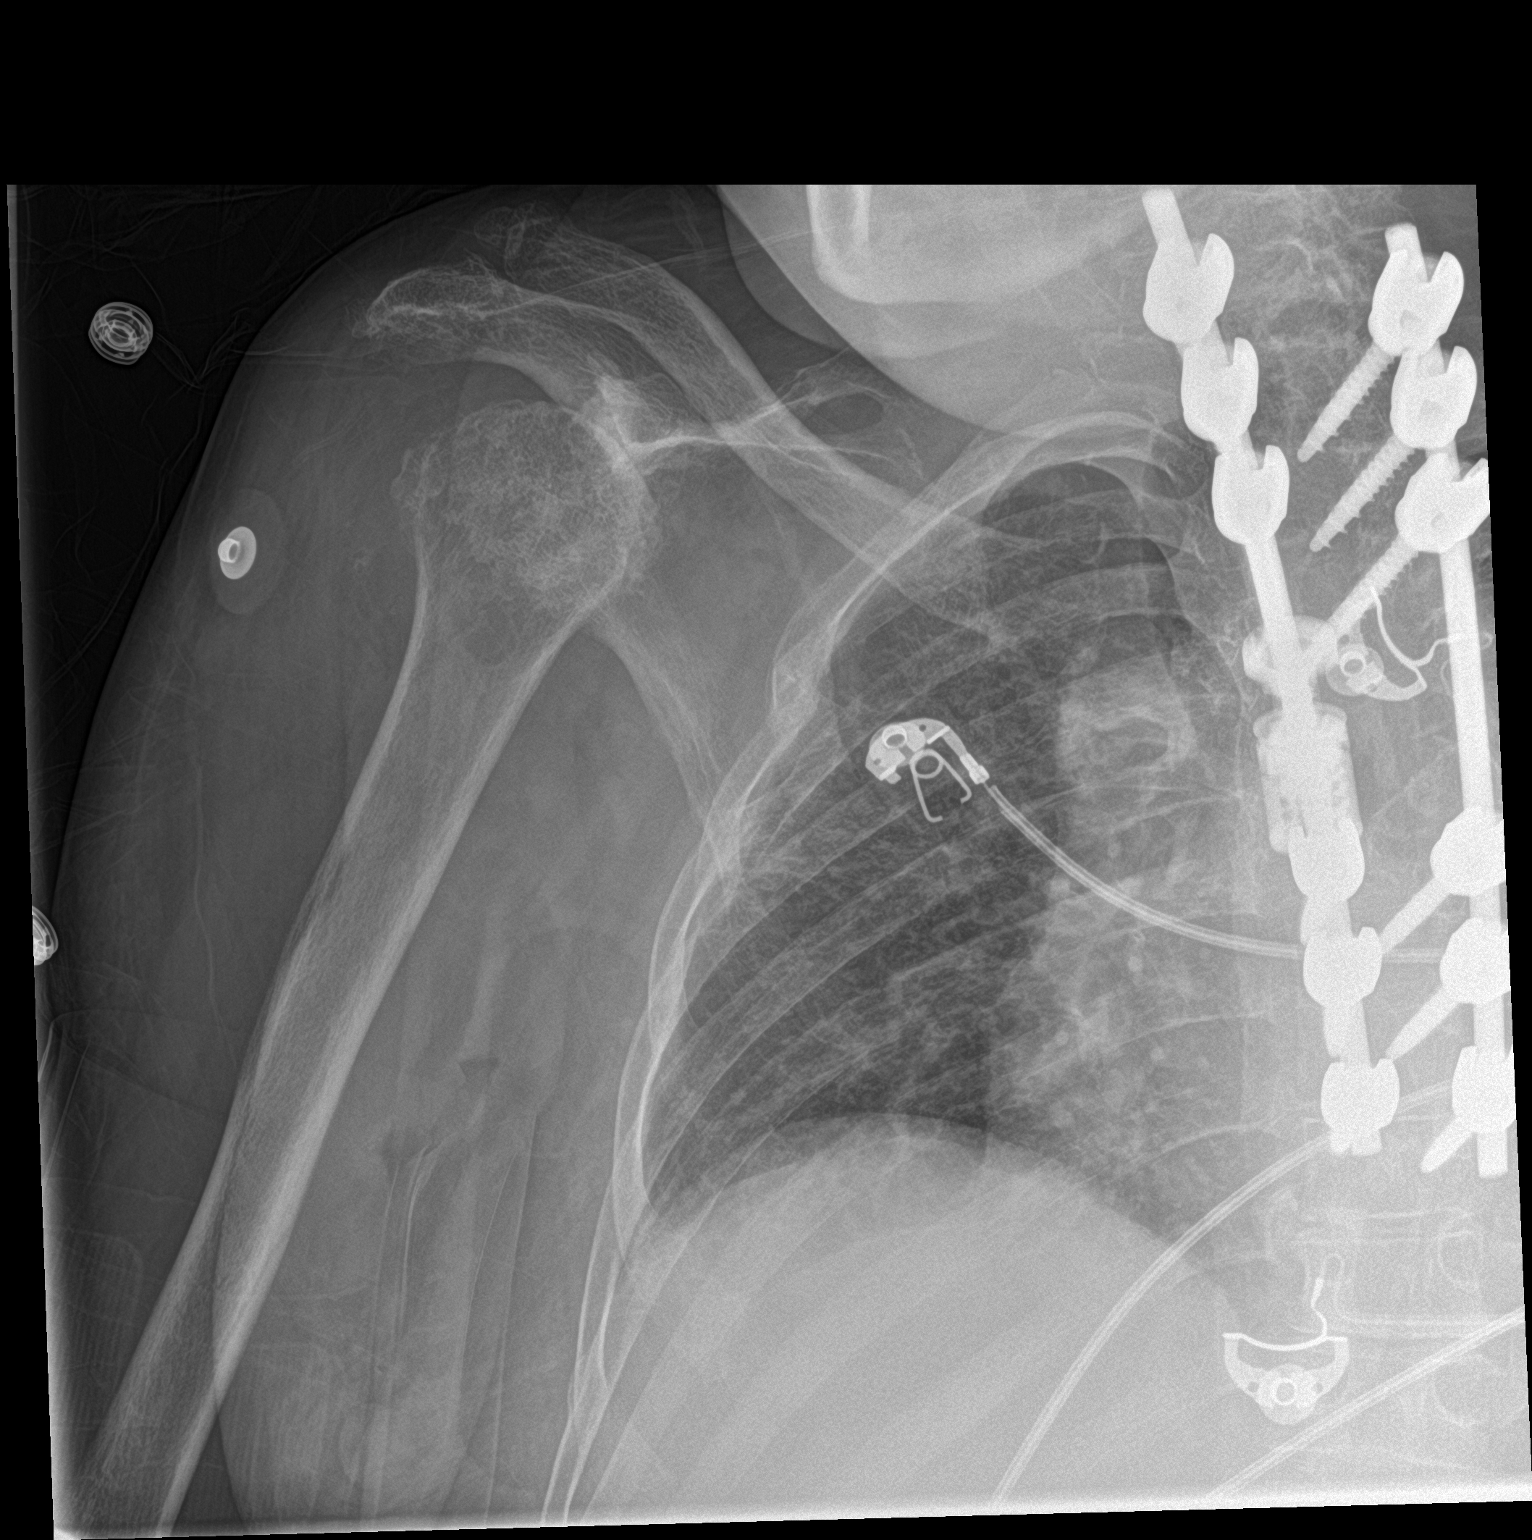

[shoulder y view]
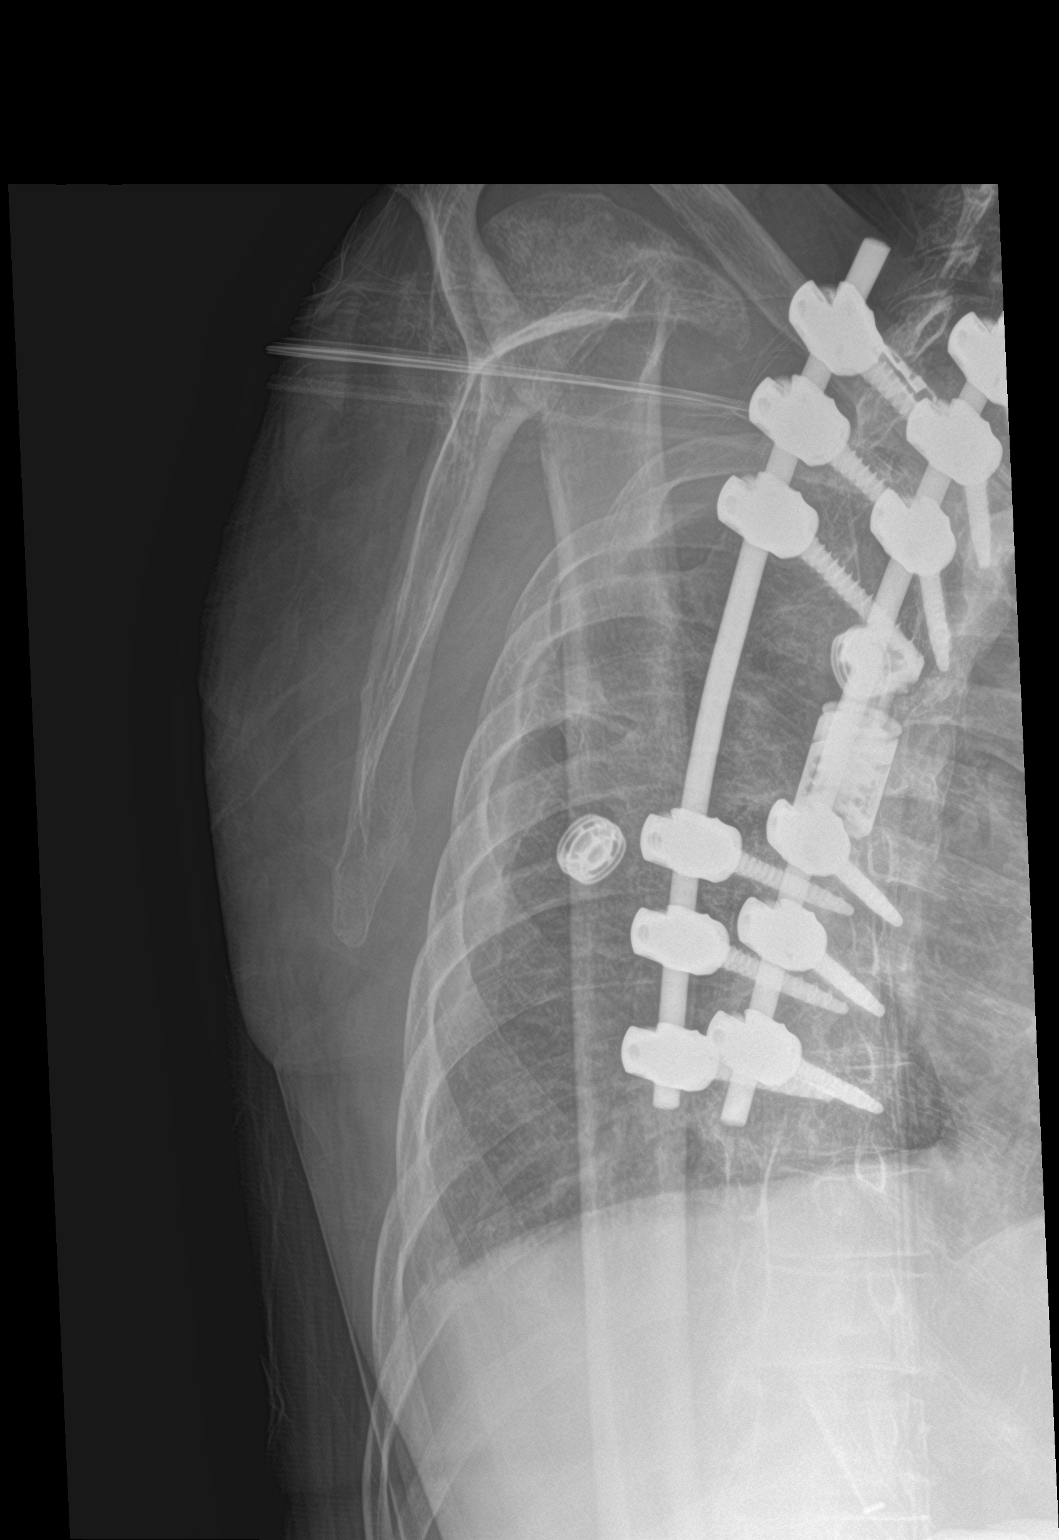

[2 of 2 positions shown; findings below may reference images not displayed]

FINDINGS: Chronic severe degenerative changes involving the right shoulder. No
definite acute fracture. Numerous remote healed right rib fractures
are noted. No definite acute fractures. The AC joint is degenerated
but stable.
IMPRESSION: No definite fracture or dislocation. Stable severe degenerative
changes.

## 2018-01-07 IMAGING — CR DG CHEST 2V
2 series · 2 of 2 positions shown · non-contrast
Comparison: 11/18/2017 chest x-ray.  10/20/2017 chest CT.

CLINICAL DATA: 61-year-old female with shortness breath. Confused.
Subsequent encounter.

EXAM:
CHEST  2 VIEW

[chest lat]
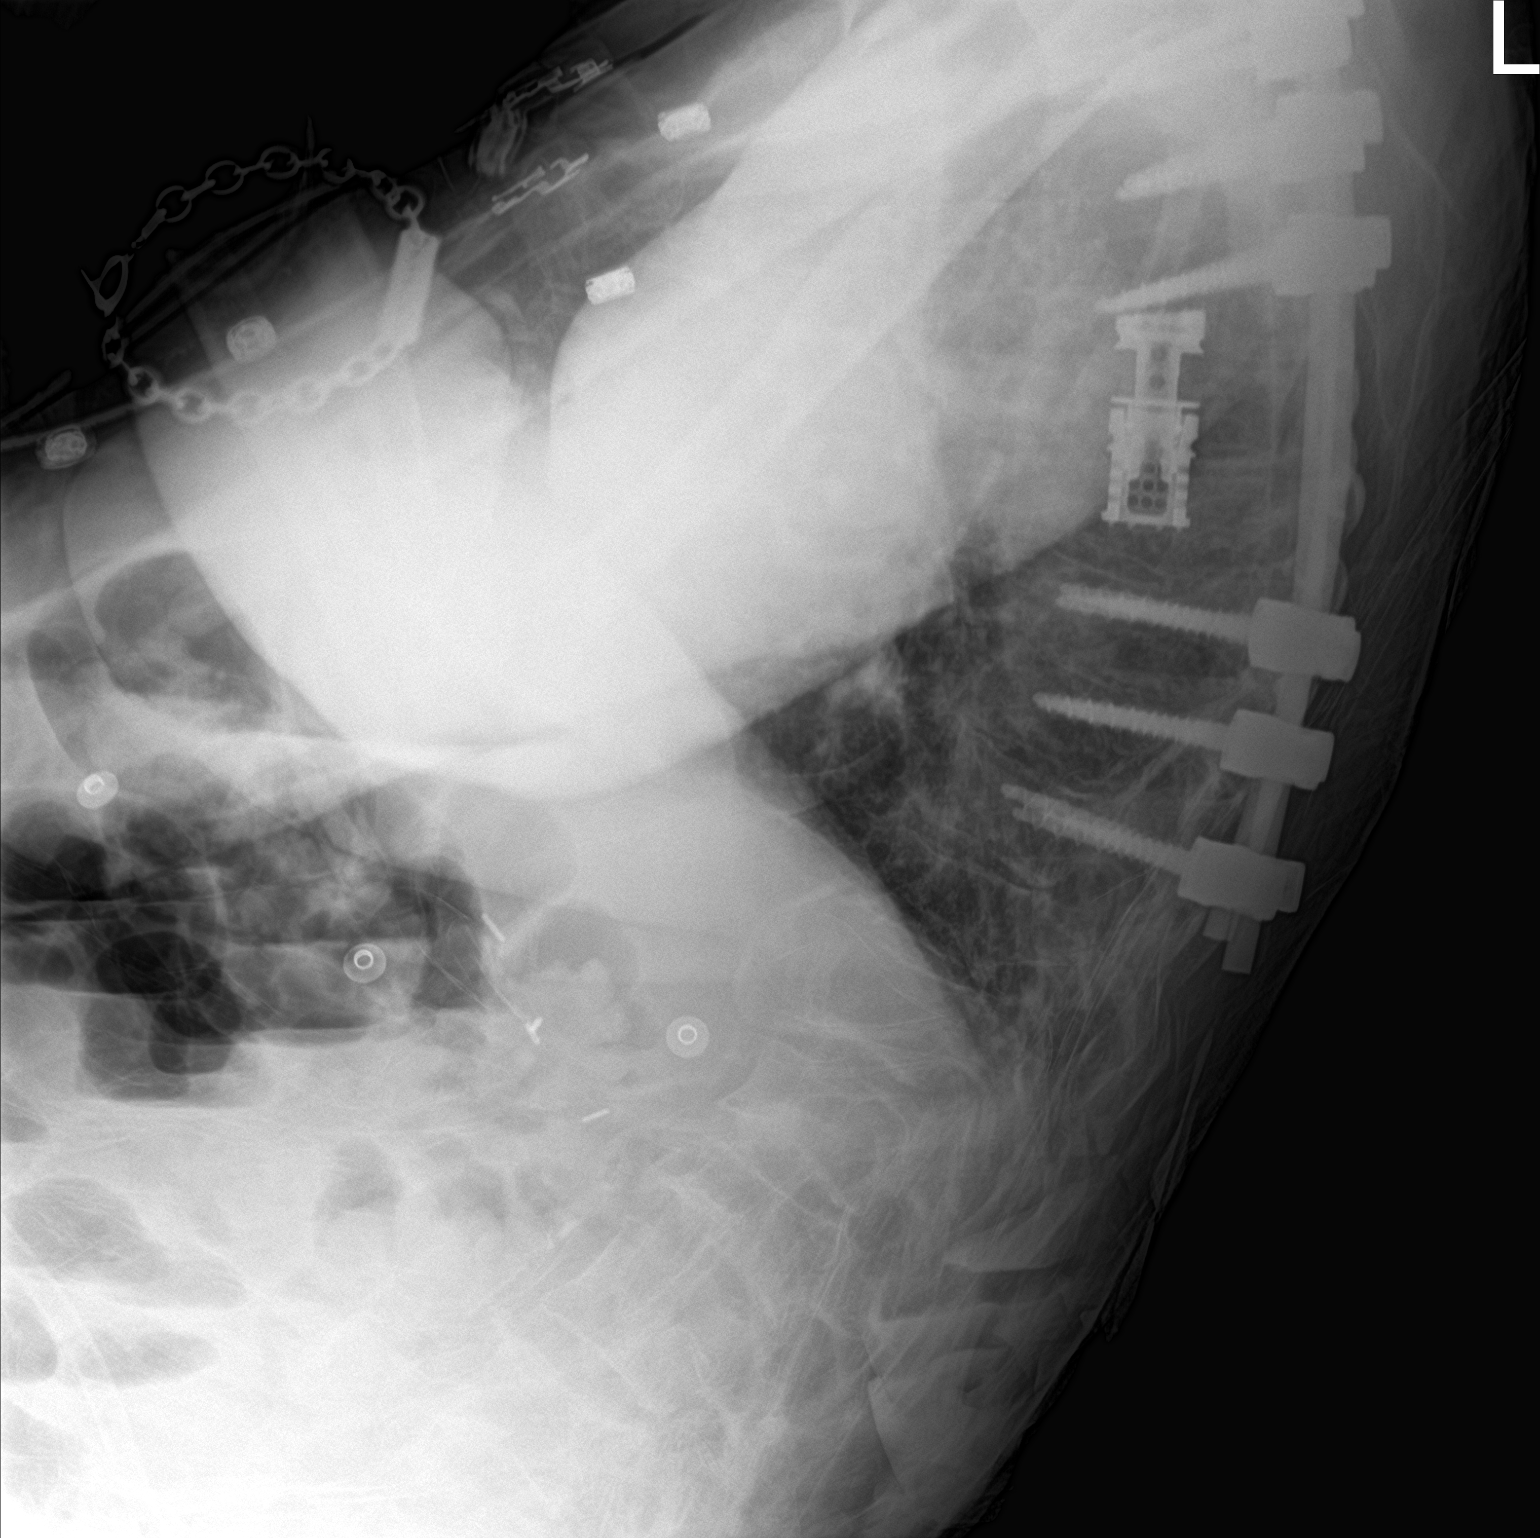

[chest ap]
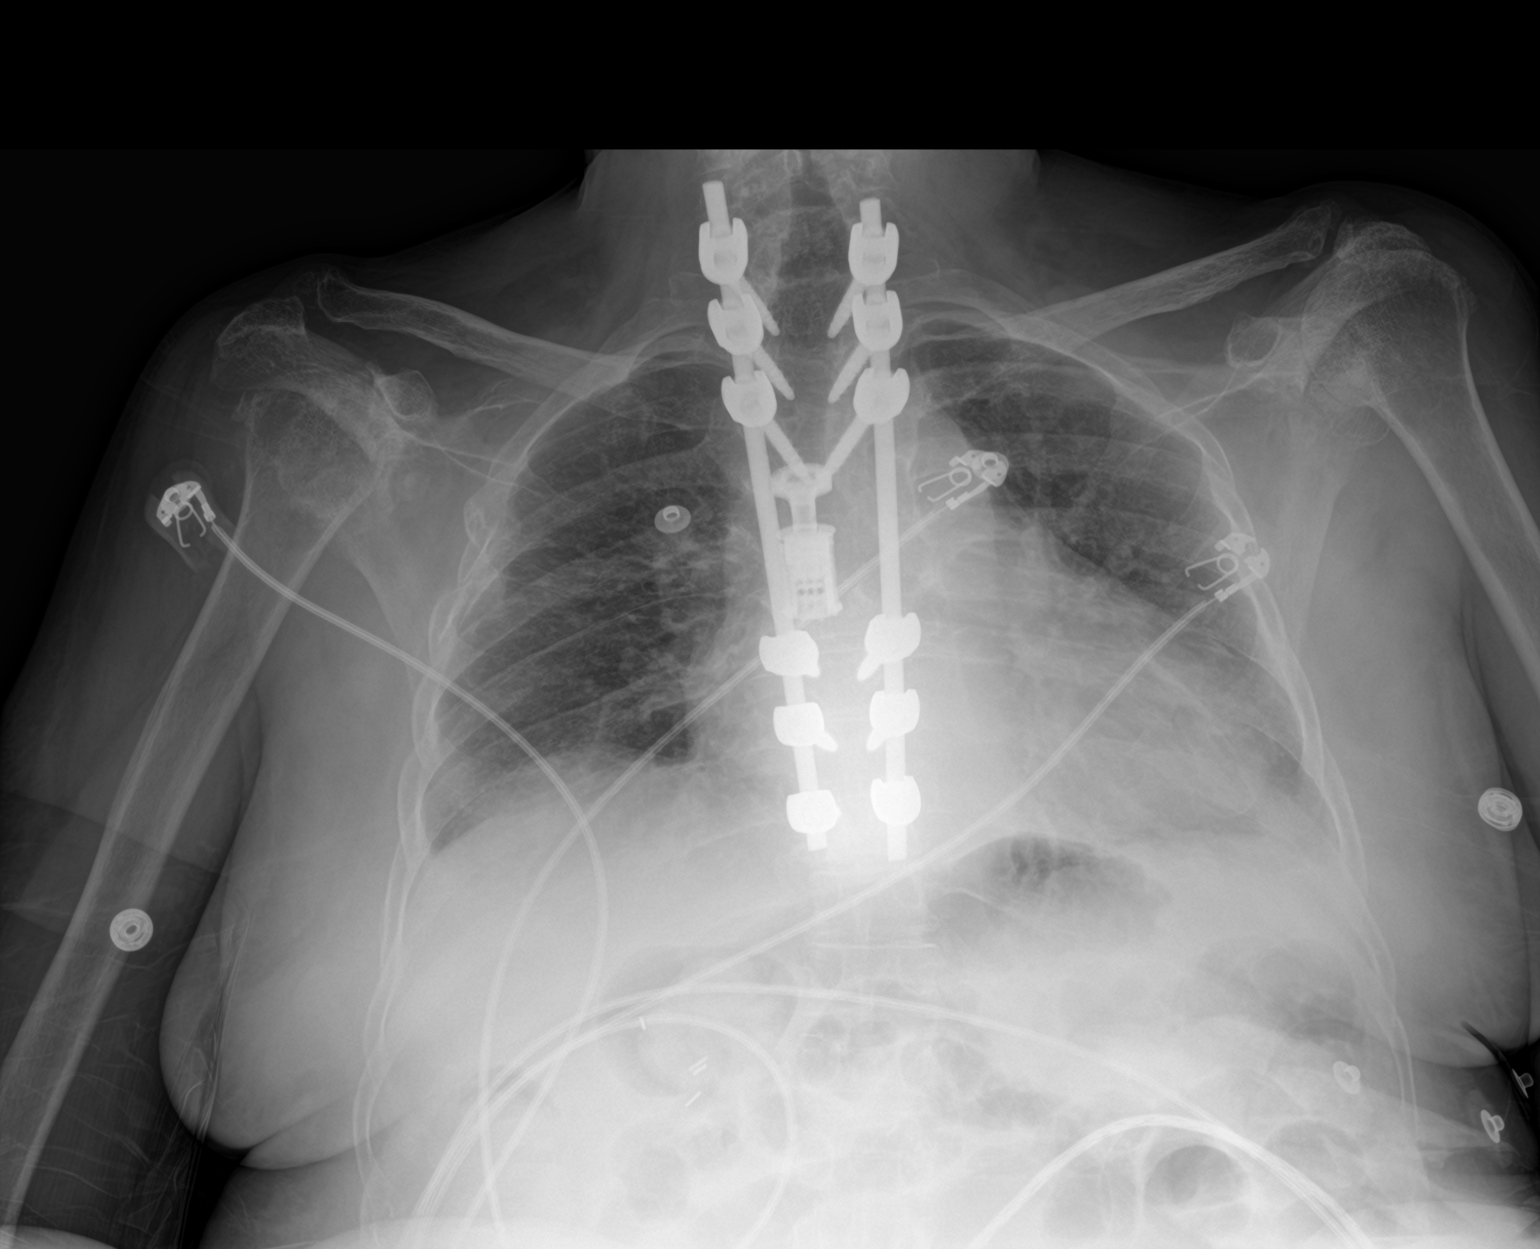

[2 of 2 positions shown; findings below may reference images not displayed]

FINDINGS: Cardiomegaly.

Chronic lung changes with mild central pulmonary prominence without
pulmonary edema or segmental consolidation.

Remote right rib fractures. Prominent bilateral shoulder joint
degenerative changes. Prior thoracic corpectomy and fusion with loss
of height of the various thoracic and upper lumbar vertebra
unchanged.

Calcified aorta.

Nonspecific bowel gas pattern.
IMPRESSION: Cardiomegaly.

Chronic lung changes without segmental consolidation or pulmonary
edema.

Aortic Atherosclerosis ([id number]).

## 2018-01-08 IMAGING — DX DG CHEST 1V PORT
1 series · 1 of 1 positions shown · non-contrast
Comparison: 11/21/2017.  10/27/2016 .

CLINICAL DATA: Aspiration.

EXAM:
PORTABLE CHEST 1 VIEW

[chest ap]
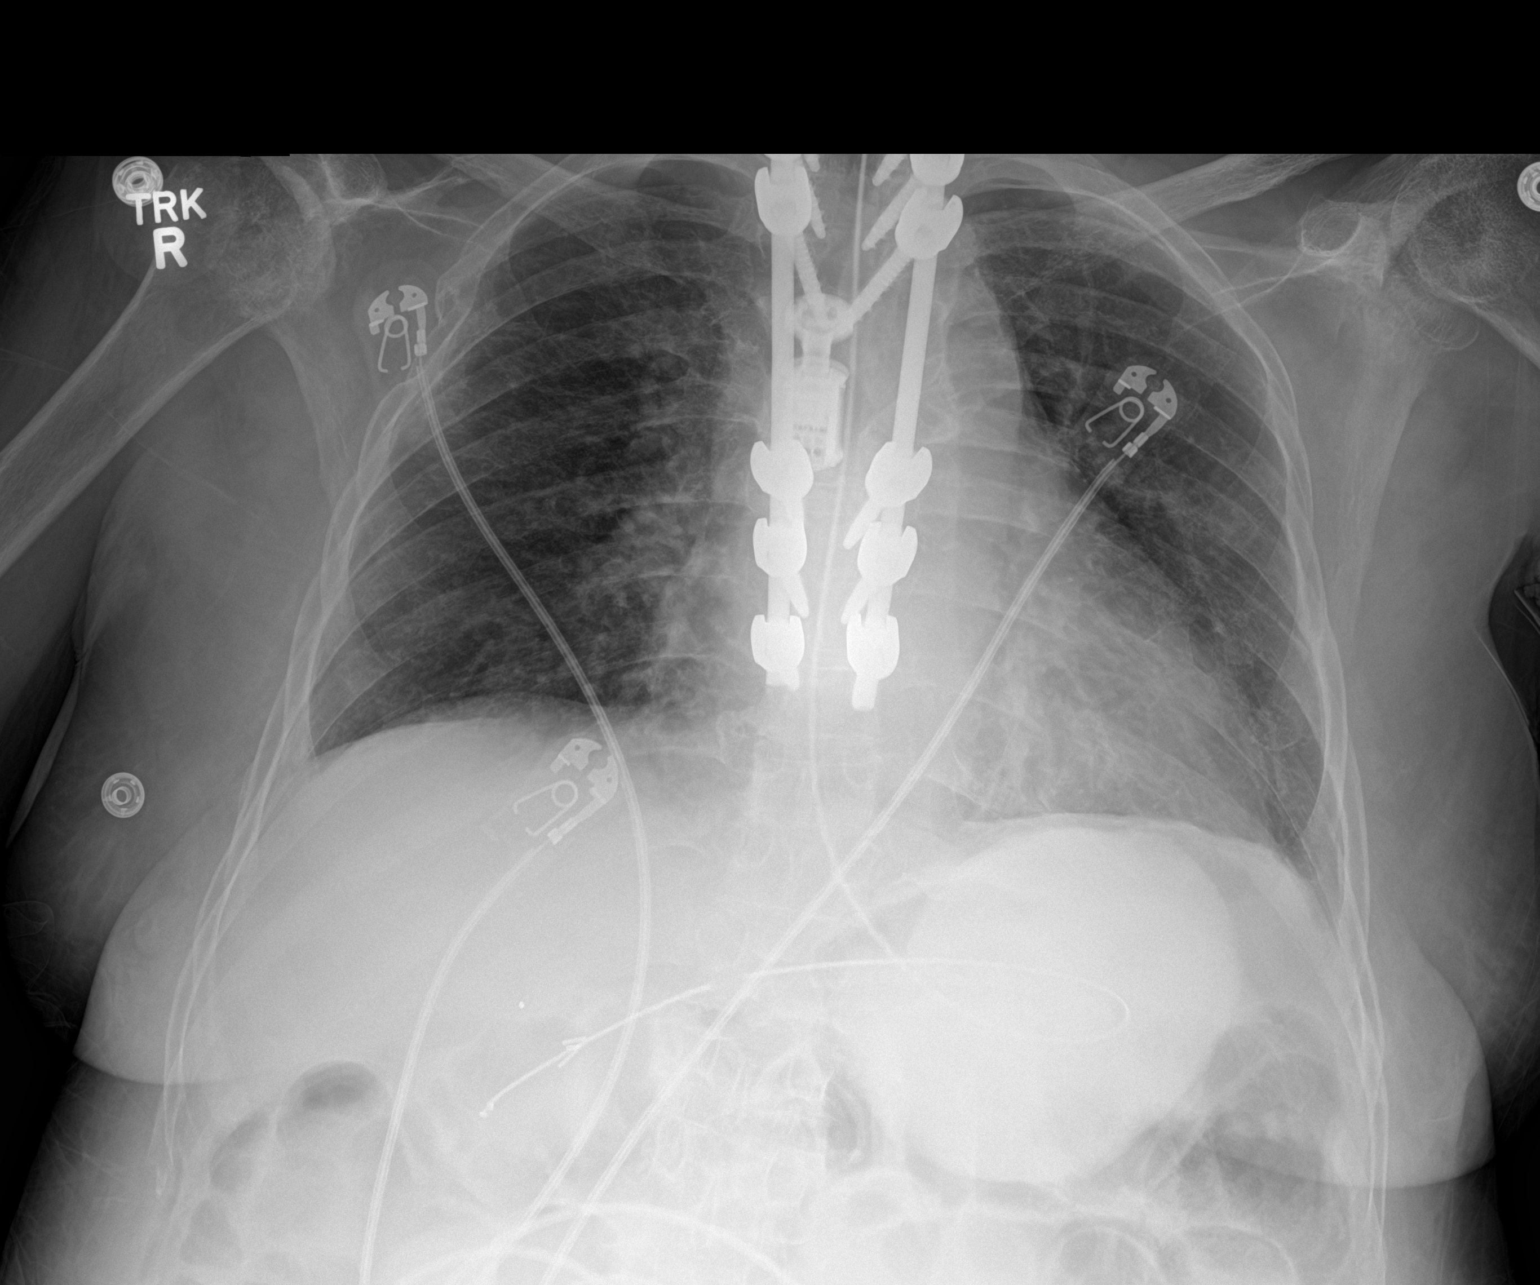

[1 of 1 positions shown; findings below may reference images not displayed]

FINDINGS: NG tube noted coiled in the stomach. Cardiomegaly with normal
pulmonary vascularity. Diffuse bilateral interstitial prominence
noted. These changes are unchanged and consistent chronic
interstitial lung disease. No pleural effusion or pneumothorax.
Degenerative changes thoracic spine . Cervicothoracic spine fusion
with hardware intact. Old right rib fractures are present.
IMPRESSION: 1. NG tube noted coiled stomach.

2. Diffuse bilateral pulmonary interstitial prominence noted.
Similar findings noted on prior exams.

Changes consistent with chronic interstitial lung disease.

## 2018-01-08 IMAGING — DX DG ABD PORTABLE 1V
1 series · 1 of 1 positions shown · non-contrast
Comparison: 11/22/2017

CLINICAL DATA: Small bowel obstruction.

EXAM:
PORTABLE ABDOMEN - 1 VIEW

[abdomen kub]
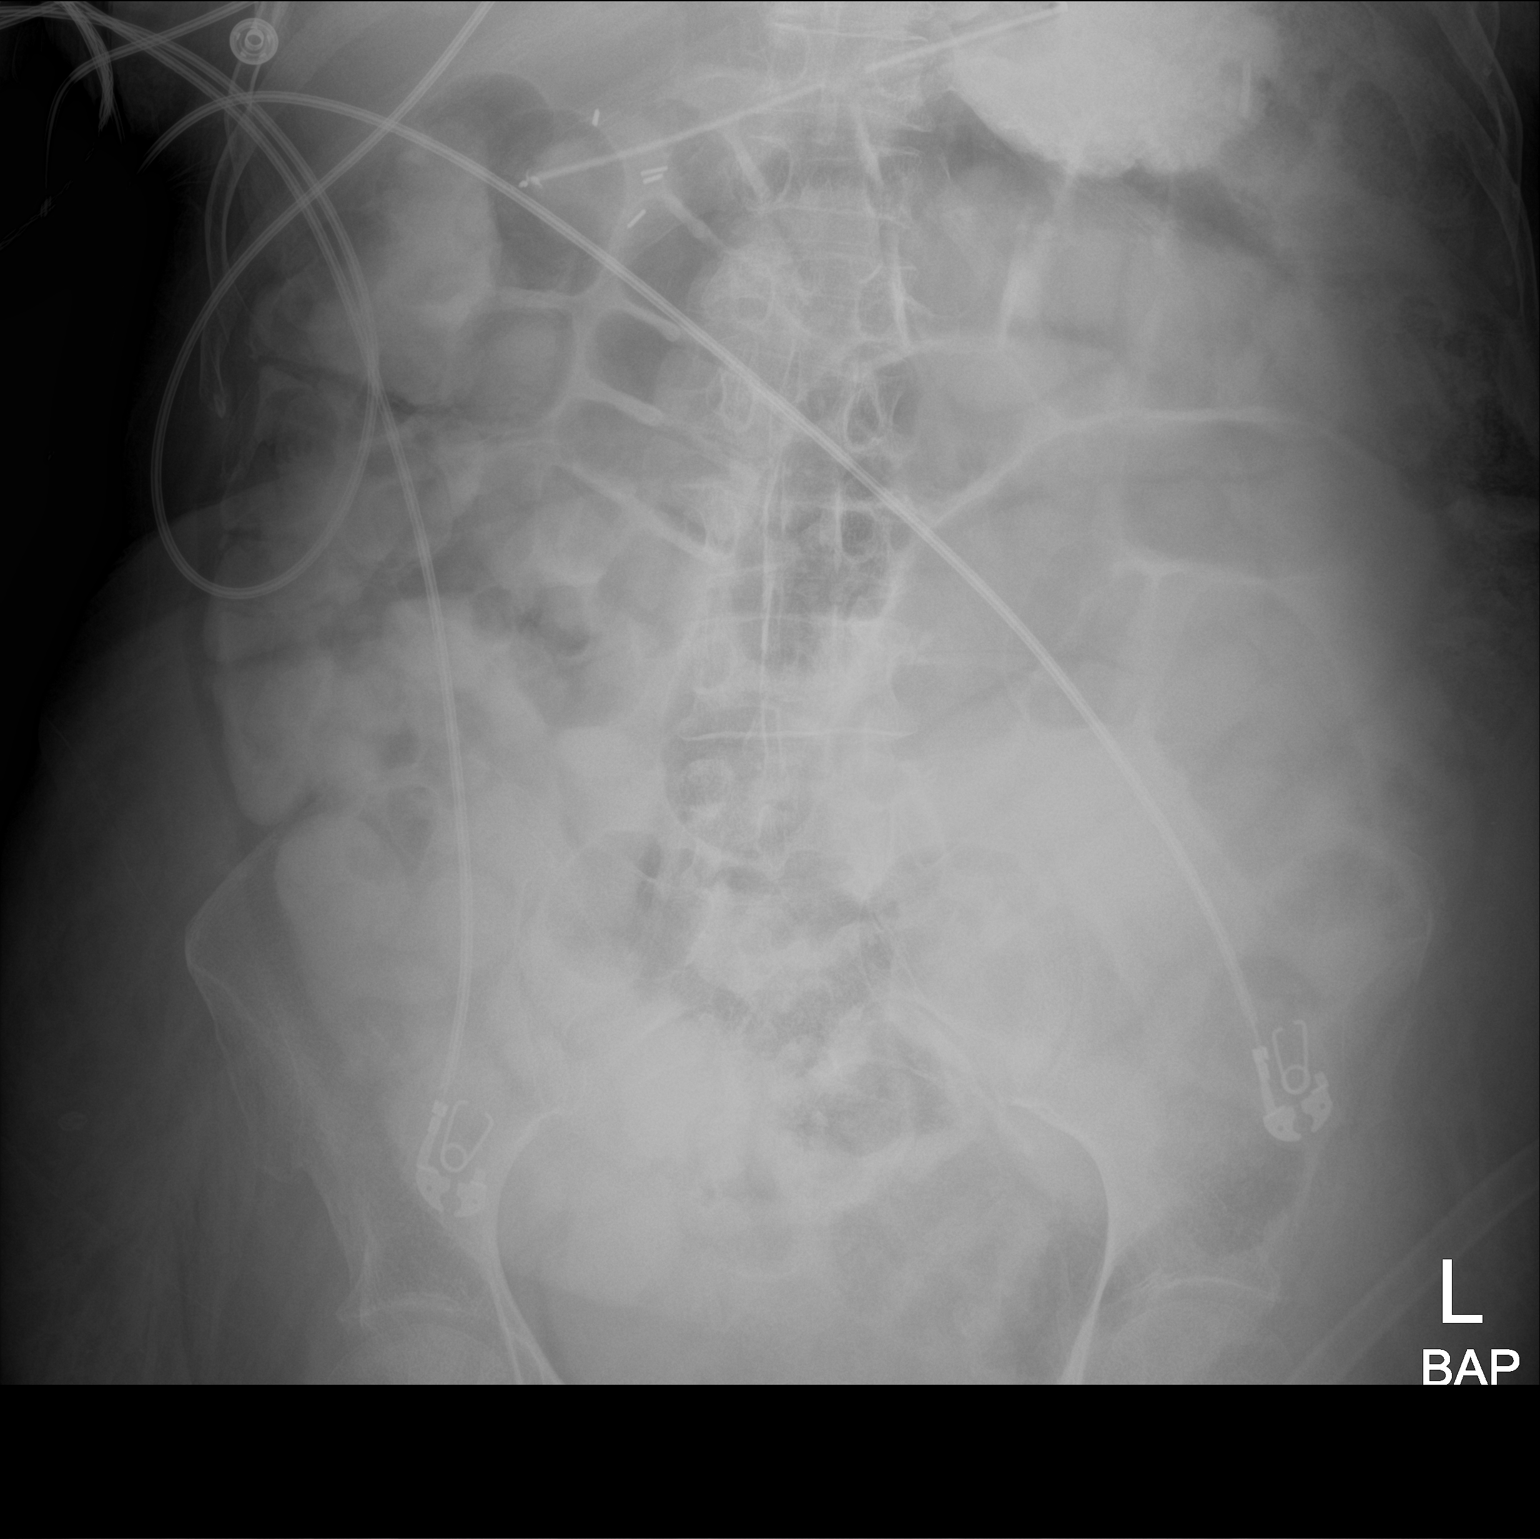

[1 of 1 positions shown; findings below may reference images not displayed]

FINDINGS: Dilated gas-filled mid abdominal small bowel consistent with
obstruction. Dilute contrast material is demonstrated in the colon
suggesting that this represents partial obstruction. Enteric tube
with tip in the right upper quadrant consistent with location in the
distal stomach. Surgical clips in the right upper quadrant.
IMPRESSION: Gaseous distention of small bowel consistent with obstruction.
Contrast material in the colon suggesting partial obstruction.
Enteric tube tip in the right upper quadrant consistent with
location in the distal stomach.

## 2018-01-08 IMAGING — DX DG ABD PORTABLE 2V
1 series · 2 of 2 positions shown · non-contrast
Comparison: Pelvis 11/18/2017.  CT abdomen and pelvis 10/20/2017

CLINICAL DATA: Acute distention of stomach and vomiting.

EXAM:
PORTABLE ABDOMEN - 2 VIEW

[Series 1: abdomen · 0.14mm/px · 2 of 2 slices shown]
[im 1/2]
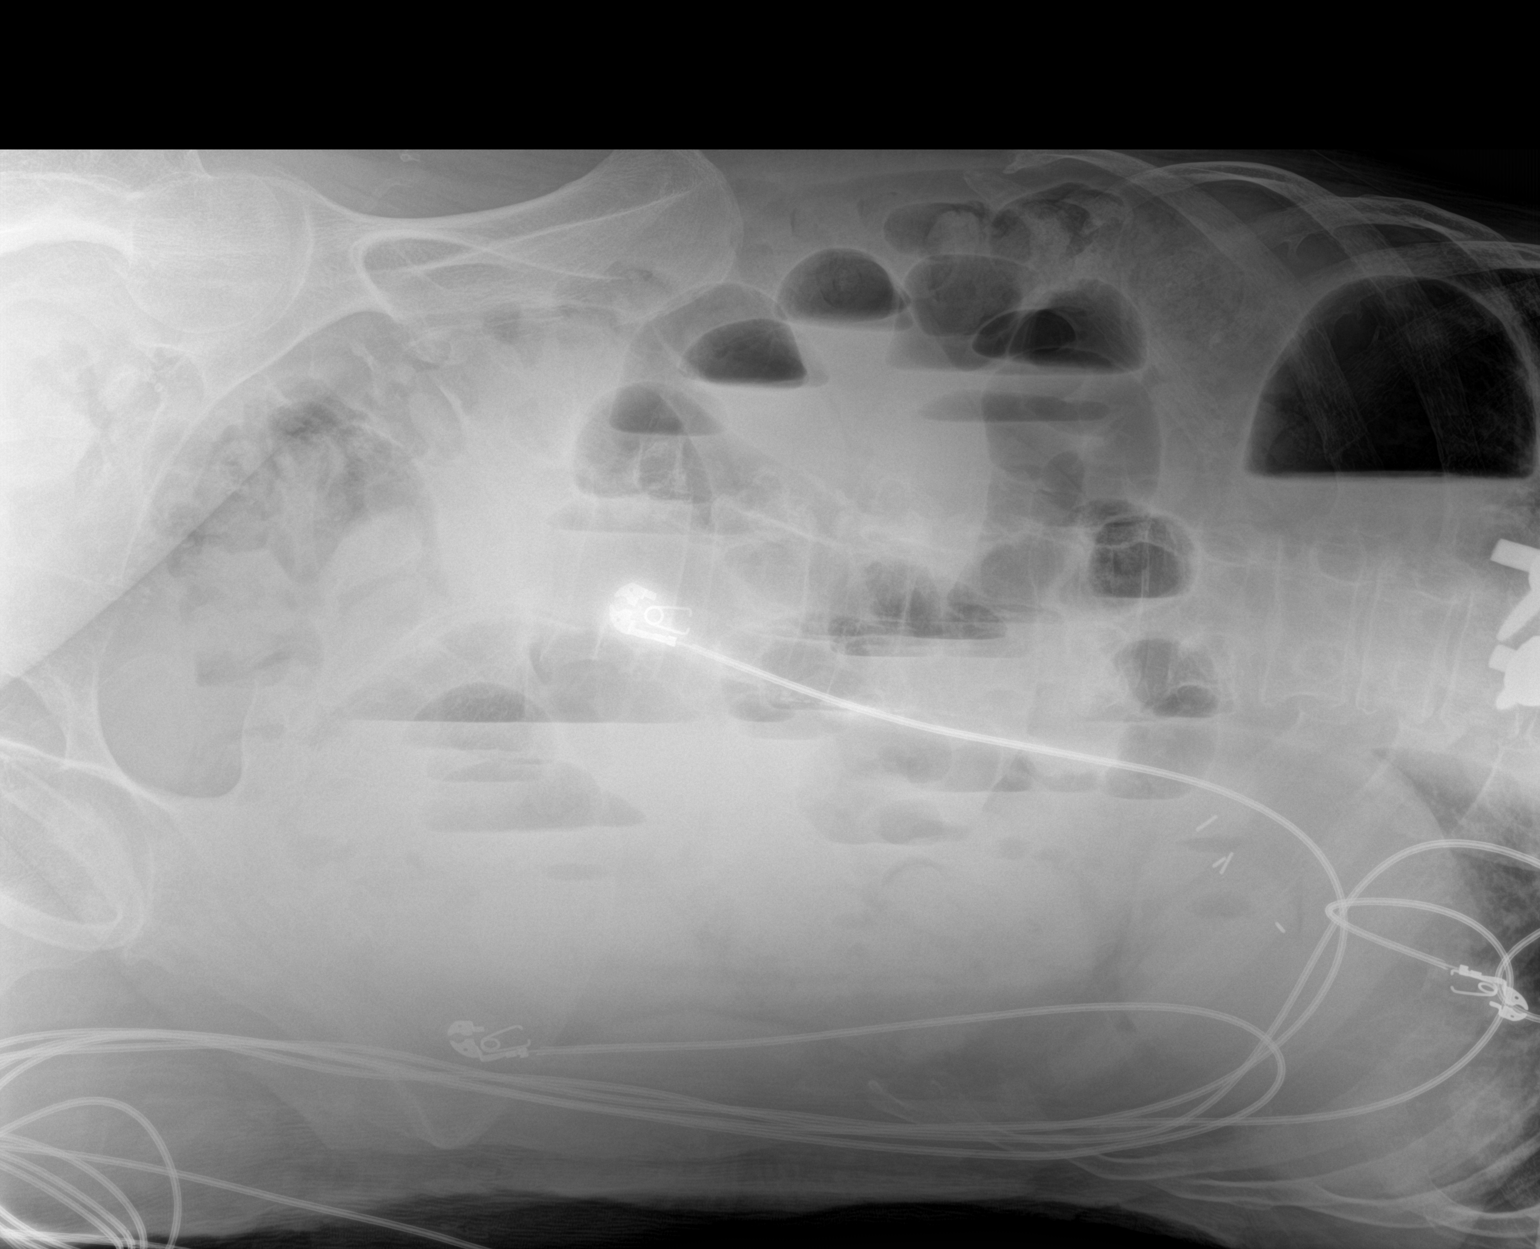
[im 2/2]
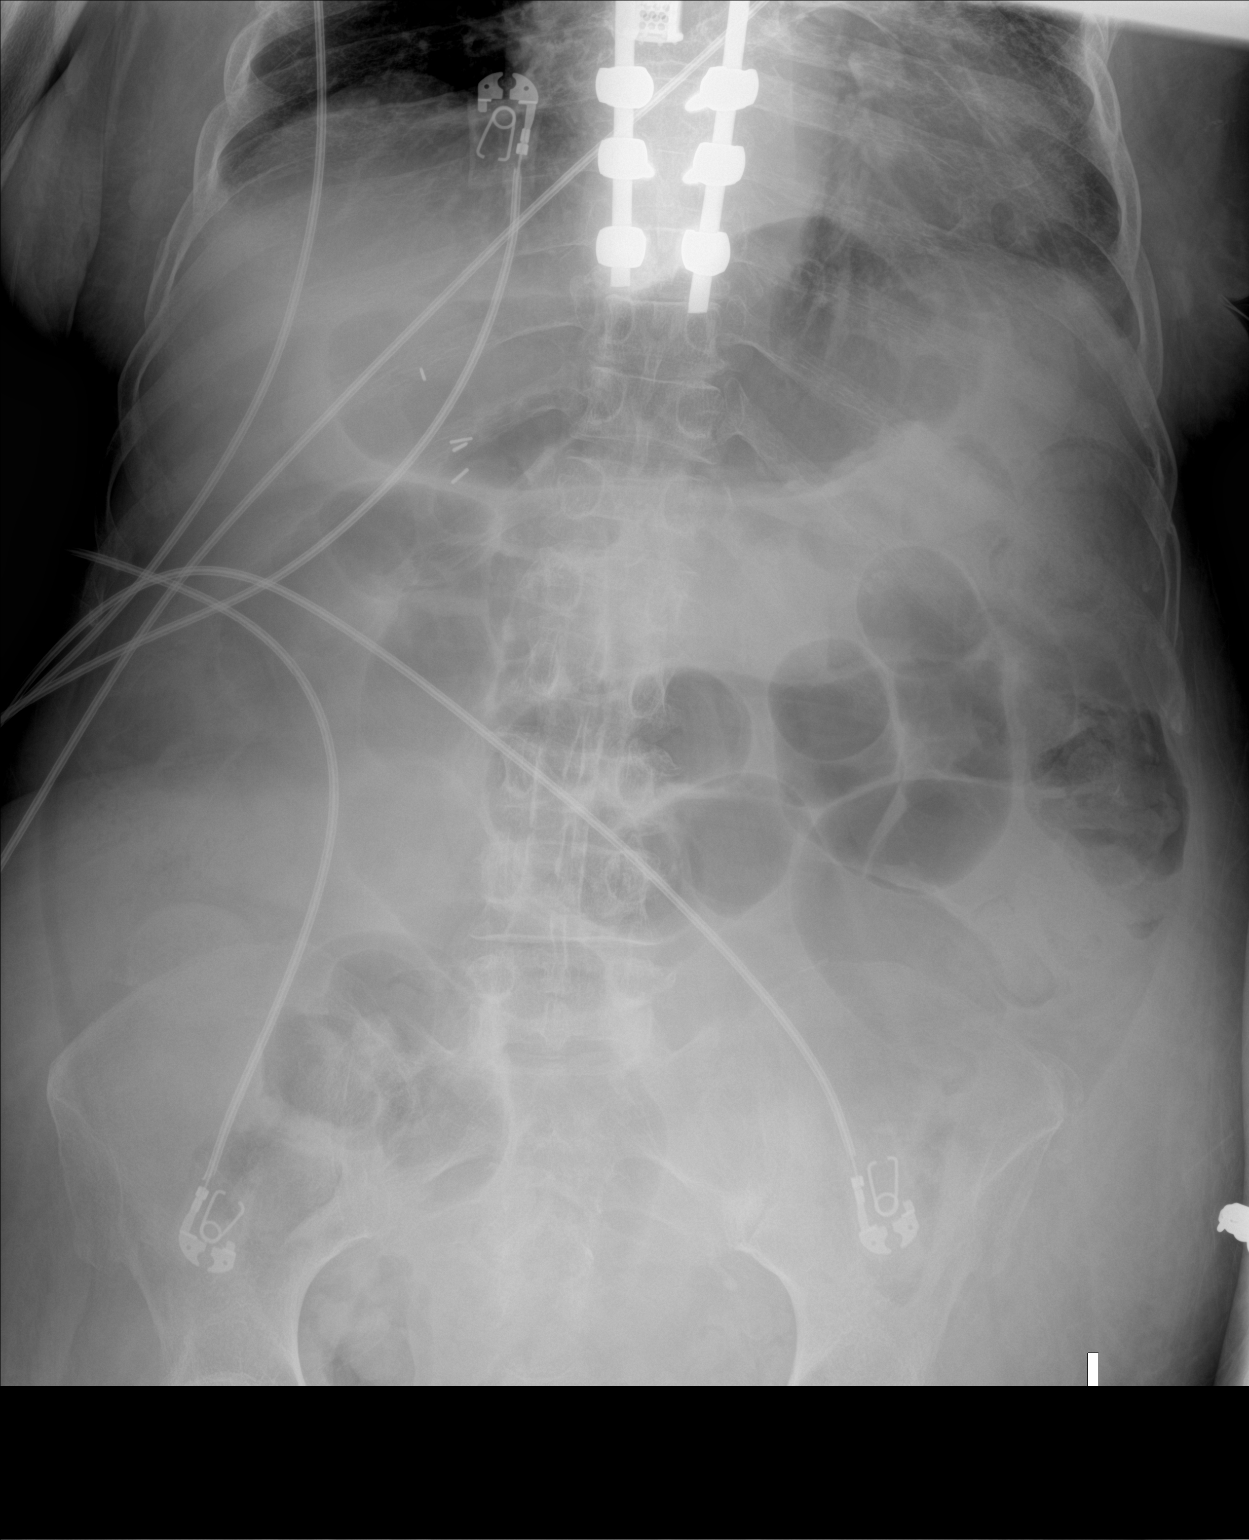

[2 of 2 positions shown; findings below may reference images not displayed]

FINDINGS: Interval development of gaseous distention of mid abdominal small
bowel with multiple air-fluid levels on the decubitus view. Findings
are consistent with small bowel obstruction. Stool fills the colon.
No free intra-abdominal air. No radiopaque stones. Surgical clips in
the right upper quadrant. Postoperative changes in the thoracic
spine.
IMPRESSION: New development of small bowel gaseous distention and air-fluid
levels consistent with small bowel obstruction.

## 2018-01-08 IMAGING — CT CT ABD-PELV W/O CM
2 of 4 series · 17 of 46 positions shown, 19 images · non-contrast
Comparison: Radiography same day.  CT 10/20/2017.

CLINICAL DATA: Vomiting and generalized abdominal pain. No recent
bowel movement.

EXAM:
CT ABDOMEN AND PELVIS WITHOUT CONTRAST
TECHNIQUE: Multidetector CT imaging of the abdomen and pelvis was performed
following the standard protocol without IV contrast.

[Series 3: a/p w/o 5mm · axial · non-contrast · 0.75mm/px · z∈[+978,+1368]mm · 14 of 86 slices shown, 16 images]
[im 4/86  soft-tissue]
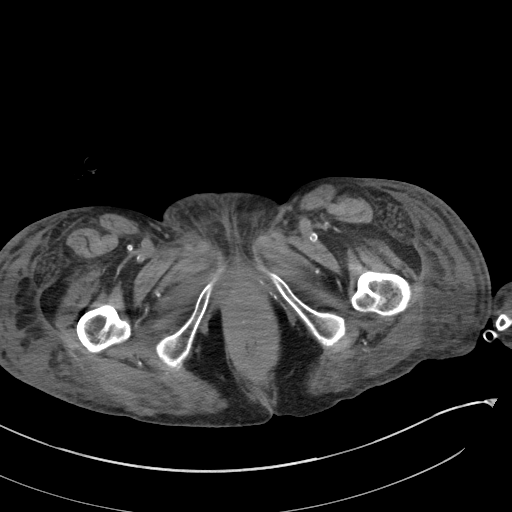
[im 4/86  bone]
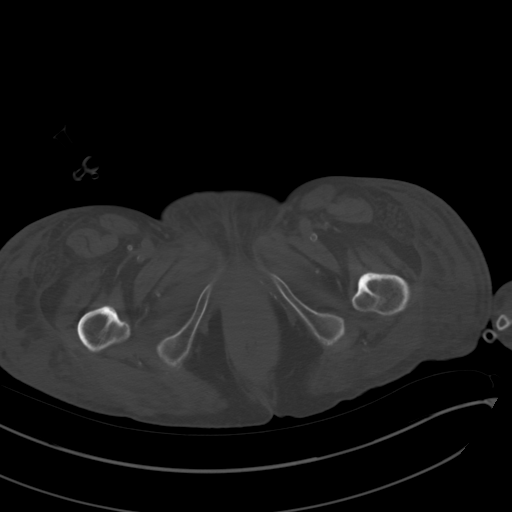
[im 10/86  soft-tissue]
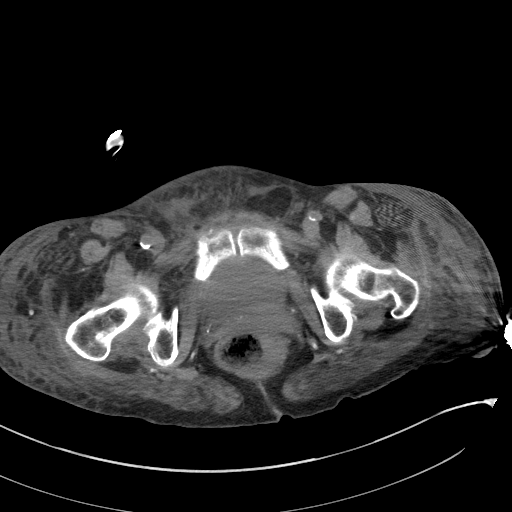
[im 17/86  soft-tissue]
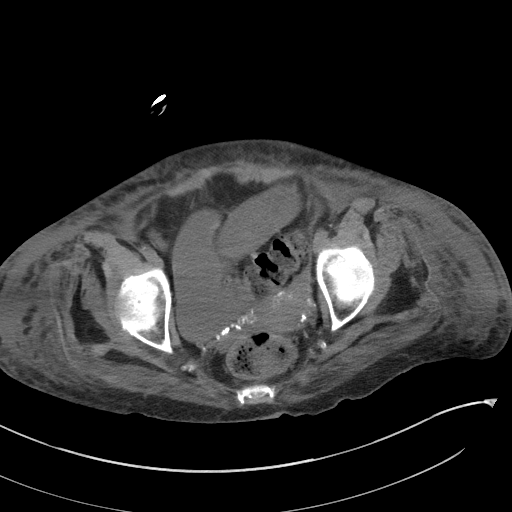
[im 23/86  soft-tissue]
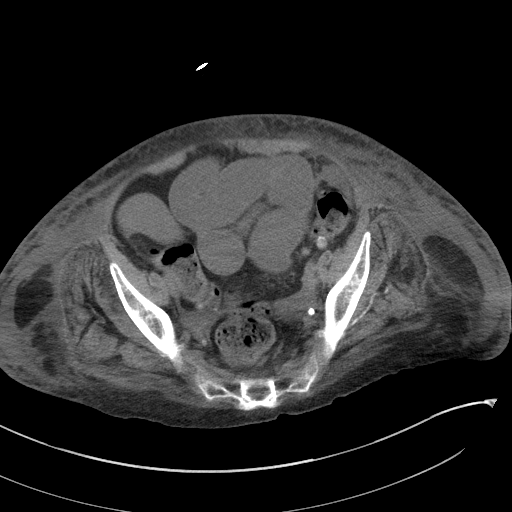
[im 30/86  soft-tissue]
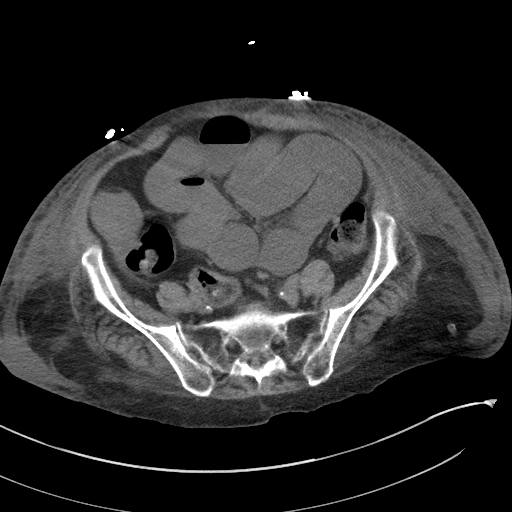
[im 33/86  soft-tissue]
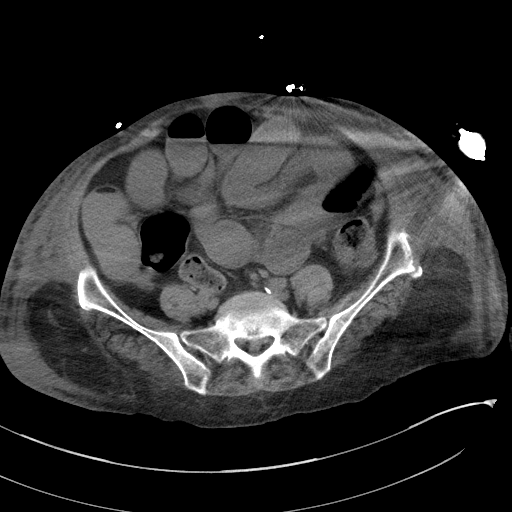
[im 40/86  soft-tissue]
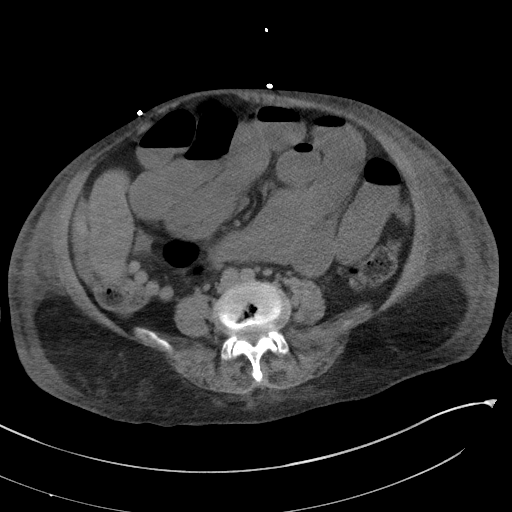
[im 46/86  soft-tissue]
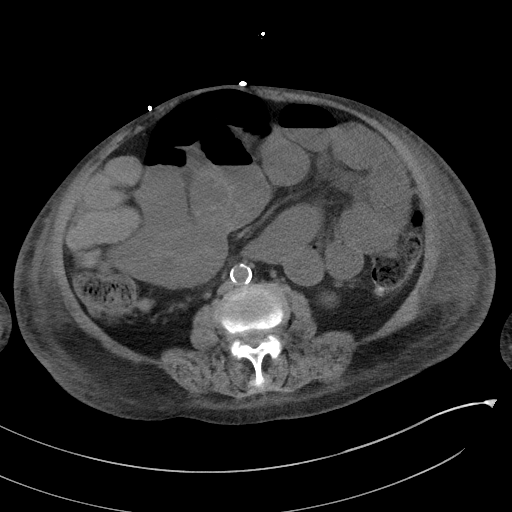
[im 53/86  soft-tissue]
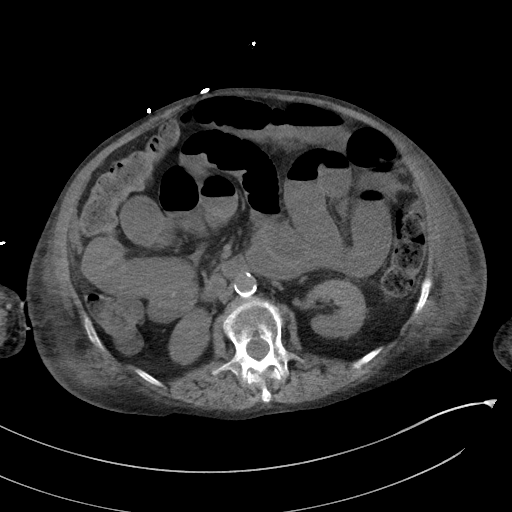
[im 53/86  bone]
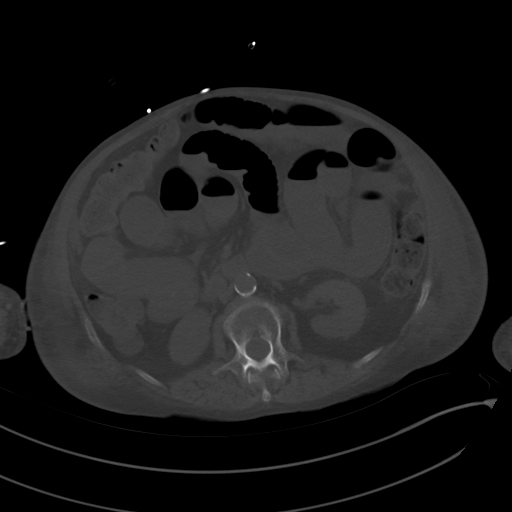
[im 56/86  soft-tissue]
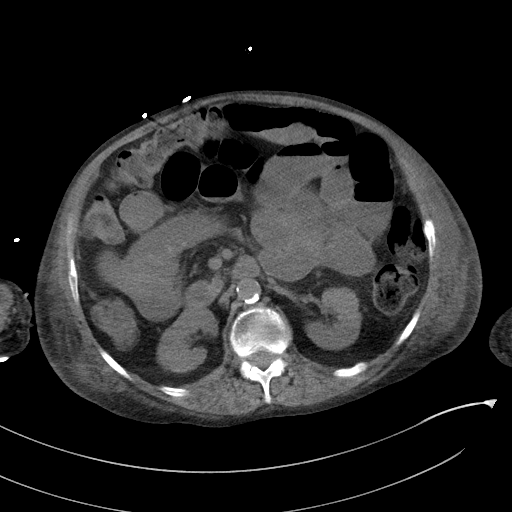
[im 63/86  soft-tissue]
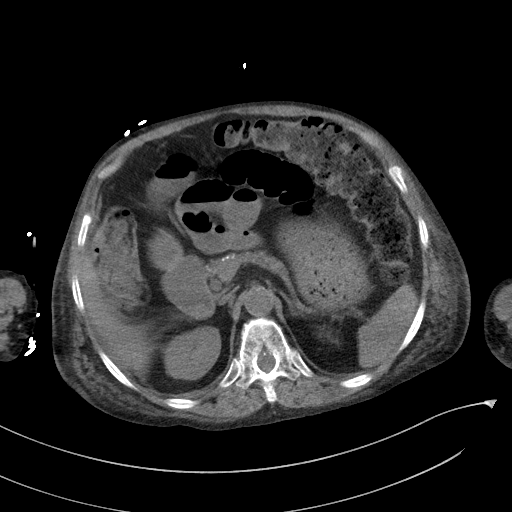
[im 69/86  soft-tissue]
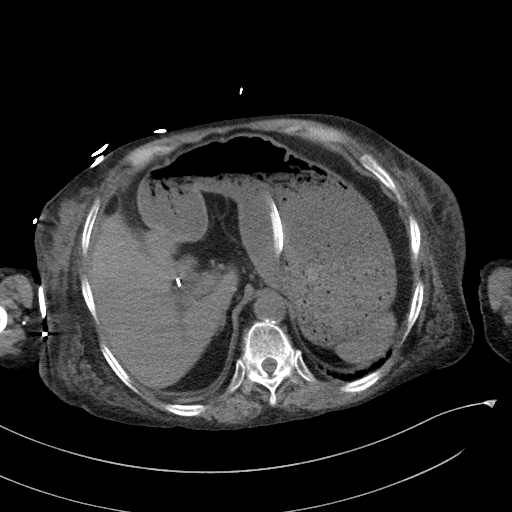
[im 76/86  soft-tissue]
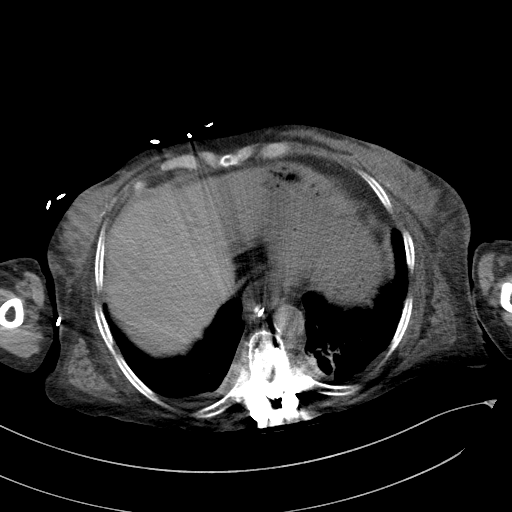
[im 82/86  soft-tissue]
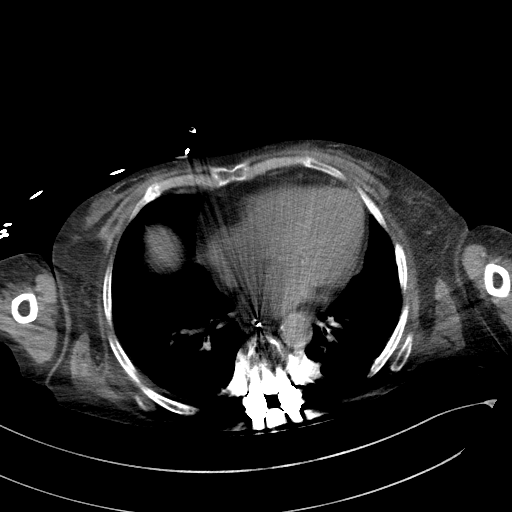

[Series 6: a/p w/o cor · coronal · non-contrast · 0.80mm/px · 3 of 141 slices shown]
[im 47/141  soft-tissue]
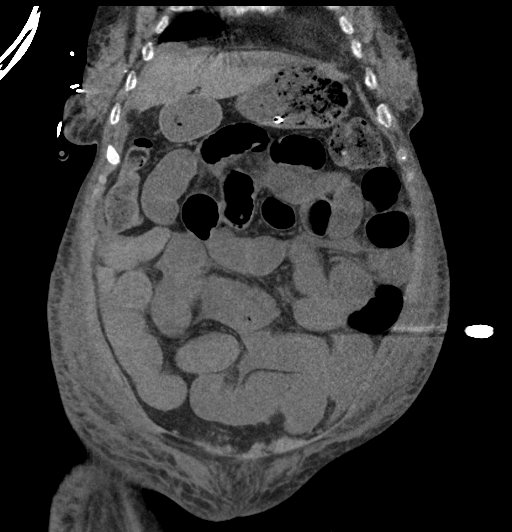
[im 63/141  soft-tissue]
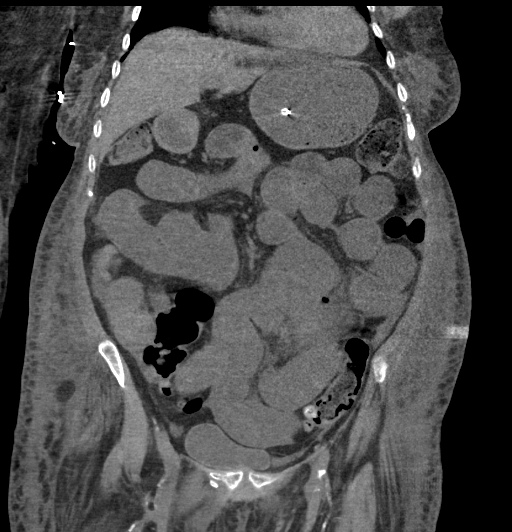
[im 78/141  soft-tissue]
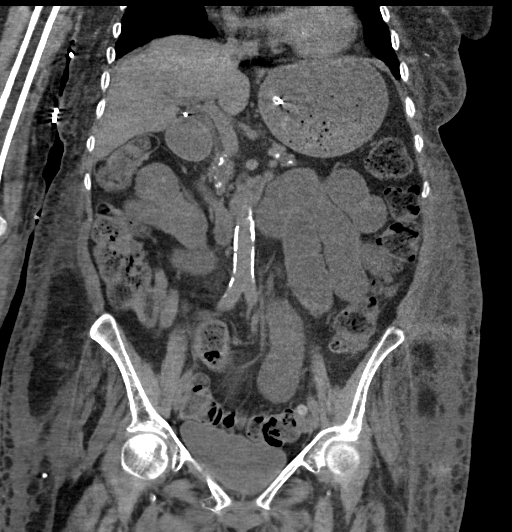

[17 of 46 positions shown; findings below may reference images not displayed]

FINDINGS: Lower chest: Small effusions layering dependently with mild
dependent atelectasis.

Hepatobiliary: No abnormality of the liver parenchyma is seen.
Previous cholecystectomy.

Pancreas: Chronic pancreatic atrophy and calcification. No acute
process.

Spleen: Normal

Adrenals/Urinary Tract: Adrenal glands are normal. Kidneys are
normal. No cyst, mass, stone or hydronephrosis. No bladder
abnormality seen.

Stomach/Bowel: Nasogastric tube in the stomach. The stomach is full
of material. There is diffuse dilatation of the small bowel which is
full of fairly homogeneous material. The distal ileum is collapsed.
This is consistent with mid ileal small bowel obstruction. There is
a moderate amount of fecal matter throughout the colon. Sigmoid
diverticulosis without evidence of diverticulitis.

Vascular/Lymphatic: Aortic atherosclerosis. IVC is flattened. No
retroperitoneal adenopathy.

Reproductive: No evidence of pelvic mass.

Other: No free fluid or air.  Diffuse soft tissue edema

Musculoskeletal: Previously seen compression fractures at T12, L1
and L3.
IMPRESSION: High-grade small bowel obstruction in the mid ileum. Specific
etiology not determined.

Moderate to large amount of fecal matter throughout the colon.
Nasogastric tube in the stomach. Stomach is full.

No acute abdominal organ pathology.

Diffuse soft tissue edema, worsened since the previous exam.

Collapsed IVC suggesting low intravascular volume.

## 2018-05-06 ENCOUNTER — Other Ambulatory Visit: Payer: Self-pay

## 2018-05-06 ENCOUNTER — Inpatient Hospital Stay (HOSPITAL_COMMUNITY)
Admission: EM | Admit: 2018-05-06 | Discharge: 2018-05-13 | DRG: 092 | Disposition: A | Discharge status: Home Health | Payer: Medicaid Other | Attending: Family Medicine | Admitting: Family Medicine

## 2018-05-06 ENCOUNTER — Encounter (HOSPITAL_COMMUNITY): Payer: Self-pay

## 2018-05-06 ENCOUNTER — Emergency Department (HOSPITAL_COMMUNITY): Payer: Medicaid Other

## 2018-05-06 DIAGNOSIS — Z8249 Family history of ischemic heart disease and other diseases of the circulatory system: Secondary | ICD-10-CM

## 2018-05-06 DIAGNOSIS — D631 Anemia in chronic kidney disease: Secondary | ICD-10-CM | POA: Diagnosis present

## 2018-05-06 DIAGNOSIS — J4530 Mild persistent asthma, uncomplicated: Secondary | ICD-10-CM | POA: Diagnosis present

## 2018-05-06 DIAGNOSIS — R569 Unspecified convulsions: Secondary | ICD-10-CM

## 2018-05-06 DIAGNOSIS — E440 Moderate protein-calorie malnutrition: Secondary | ICD-10-CM | POA: Diagnosis present

## 2018-05-06 DIAGNOSIS — M109 Gout, unspecified: Secondary | ICD-10-CM | POA: Diagnosis present

## 2018-05-06 DIAGNOSIS — E162 Hypoglycemia, unspecified: Secondary | ICD-10-CM | POA: Diagnosis not present

## 2018-05-06 DIAGNOSIS — I129 Hypertensive chronic kidney disease with stage 1 through stage 4 chronic kidney disease, or unspecified chronic kidney disease: Secondary | ICD-10-CM | POA: Diagnosis present

## 2018-05-06 DIAGNOSIS — K7682 Hepatic encephalopathy: Secondary | ICD-10-CM | POA: Diagnosis not present

## 2018-05-06 DIAGNOSIS — Z8673 Personal history of transient ischemic attack (TIA), and cerebral infarction without residual deficits: Secondary | ICD-10-CM

## 2018-05-06 DIAGNOSIS — M6281 Muscle weakness (generalized): Secondary | ICD-10-CM | POA: Diagnosis present

## 2018-05-06 DIAGNOSIS — T428X5A Adverse effect of antiparkinsonism drugs and other central muscle-tone depressants, initial encounter: Secondary | ICD-10-CM | POA: Diagnosis present

## 2018-05-06 DIAGNOSIS — N179 Acute kidney failure, unspecified: Secondary | ICD-10-CM | POA: Diagnosis present

## 2018-05-06 DIAGNOSIS — Z7952 Long term (current) use of systemic steroids: Secondary | ICD-10-CM

## 2018-05-06 DIAGNOSIS — K5790 Diverticulosis of intestine, part unspecified, without perforation or abscess without bleeding: Secondary | ICD-10-CM | POA: Diagnosis present

## 2018-05-06 DIAGNOSIS — Z681 Body mass index (BMI) 19 or less, adult: Secondary | ICD-10-CM

## 2018-05-06 DIAGNOSIS — K7200 Acute and subacute hepatic failure without coma: Secondary | ICD-10-CM | POA: Diagnosis not present

## 2018-05-06 DIAGNOSIS — I480 Paroxysmal atrial fibrillation: Secondary | ICD-10-CM | POA: Diagnosis present

## 2018-05-06 DIAGNOSIS — E1165 Type 2 diabetes mellitus with hyperglycemia: Secondary | ICD-10-CM | POA: Diagnosis present

## 2018-05-06 DIAGNOSIS — Z7984 Long term (current) use of oral hypoglycemic drugs: Secondary | ICD-10-CM

## 2018-05-06 DIAGNOSIS — D649 Anemia, unspecified: Secondary | ICD-10-CM | POA: Diagnosis present

## 2018-05-06 DIAGNOSIS — N183 Chronic kidney disease, stage 3 (moderate): Secondary | ICD-10-CM | POA: Diagnosis present

## 2018-05-06 DIAGNOSIS — E872 Acidosis, unspecified: Secondary | ICD-10-CM | POA: Diagnosis present

## 2018-05-06 DIAGNOSIS — I119 Hypertensive heart disease without heart failure: Secondary | ICD-10-CM | POA: Diagnosis present

## 2018-05-06 DIAGNOSIS — E875 Hyperkalemia: Secondary | ICD-10-CM | POA: Diagnosis not present

## 2018-05-06 DIAGNOSIS — Z7982 Long term (current) use of aspirin: Secondary | ICD-10-CM

## 2018-05-06 DIAGNOSIS — E119 Type 2 diabetes mellitus without complications: Secondary | ICD-10-CM

## 2018-05-06 DIAGNOSIS — Z87898 Personal history of other specified conditions: Secondary | ICD-10-CM

## 2018-05-06 DIAGNOSIS — R109 Unspecified abdominal pain: Secondary | ICD-10-CM

## 2018-05-06 DIAGNOSIS — R739 Hyperglycemia, unspecified: Secondary | ICD-10-CM | POA: Diagnosis present

## 2018-05-06 DIAGNOSIS — R112 Nausea with vomiting, unspecified: Secondary | ICD-10-CM

## 2018-05-06 DIAGNOSIS — Y92230 Patient room in hospital as the place of occurrence of the external cause: Secondary | ICD-10-CM | POA: Diagnosis present

## 2018-05-06 DIAGNOSIS — I272 Pulmonary hypertension, unspecified: Secondary | ICD-10-CM | POA: Diagnosis present

## 2018-05-06 DIAGNOSIS — T426X5A Adverse effect of other antiepileptic and sedative-hypnotic drugs, initial encounter: Secondary | ICD-10-CM | POA: Diagnosis present

## 2018-05-06 DIAGNOSIS — E1122 Type 2 diabetes mellitus with diabetic chronic kidney disease: Secondary | ICD-10-CM | POA: Diagnosis present

## 2018-05-06 DIAGNOSIS — Z7901 Long term (current) use of anticoagulants: Secondary | ICD-10-CM

## 2018-05-06 DIAGNOSIS — G253 Myoclonus: Secondary | ICD-10-CM | POA: Diagnosis present

## 2018-05-06 DIAGNOSIS — K861 Other chronic pancreatitis: Secondary | ICD-10-CM | POA: Diagnosis present

## 2018-05-06 DIAGNOSIS — T50905A Adverse effect of unspecified drugs, medicaments and biological substances, initial encounter: Secondary | ICD-10-CM | POA: Diagnosis present

## 2018-05-06 DIAGNOSIS — Z841 Family history of disorders of kidney and ureter: Secondary | ICD-10-CM

## 2018-05-06 DIAGNOSIS — Z22322 Carrier or suspected carrier of Methicillin resistant Staphylococcus aureus: Secondary | ICD-10-CM

## 2018-05-06 DIAGNOSIS — K219 Gastro-esophageal reflux disease without esophagitis: Secondary | ICD-10-CM | POA: Diagnosis present

## 2018-05-06 DIAGNOSIS — G40109 Localization-related (focal) (partial) symptomatic epilepsy and epileptic syndromes with simple partial seizures, not intractable, without status epilepticus: Secondary | ICD-10-CM | POA: Diagnosis present

## 2018-05-06 DIAGNOSIS — Z87891 Personal history of nicotine dependence: Secondary | ICD-10-CM

## 2018-05-06 DIAGNOSIS — G92 Toxic encephalopathy: Principal | ICD-10-CM | POA: Diagnosis present

## 2018-05-06 DIAGNOSIS — T402X5A Adverse effect of other opioids, initial encounter: Secondary | ICD-10-CM | POA: Diagnosis present

## 2018-05-06 DIAGNOSIS — G9341 Metabolic encephalopathy: Secondary | ICD-10-CM | POA: Diagnosis not present

## 2018-05-06 HISTORY — DX: Acute posthemorrhagic anemia: D62

## 2018-05-06 HISTORY — DX: Duodenitis without bleeding: K29.80

## 2018-05-06 HISTORY — DX: Acute ischemic heart disease, unspecified: I24.9

## 2018-05-06 HISTORY — DX: Other injury of unspecified body region, initial encounter: T14.8XXA

## 2018-05-06 HISTORY — DX: Unspecified convulsions: R56.9

## 2018-05-06 HISTORY — DX: Unspecified intestinal obstruction, unspecified as to partial versus complete obstruction: K56.609

## 2018-05-06 HISTORY — DX: Cerebral infarction, unspecified: I63.9

## 2018-05-06 LAB — CBC
HEMATOCRIT: 27 % — AB (ref 36.0–46.0)
Hemoglobin: 8.7 g/dL — ABNORMAL LOW (ref 12.0–15.0)
MCH: 25.2 pg — AB (ref 26.0–34.0)
MCHC: 32.2 g/dL (ref 30.0–36.0)
MCV: 78.3 fL (ref 78.0–100.0)
Platelets: 164 10*3/uL (ref 150–400)
RBC: 3.45 MIL/uL — ABNORMAL LOW (ref 3.87–5.11)
RDW: 15.7 % — ABNORMAL HIGH (ref 11.5–15.5)
WBC: 8.1 10*3/uL (ref 4.0–10.5)

## 2018-05-06 LAB — URINALYSIS, MICROSCOPIC (REFLEX): RBC / HPF: NONE SEEN RBC/hpf (ref 0–5)

## 2018-05-06 LAB — COMPREHENSIVE METABOLIC PANEL
ALT: 21 U/L (ref 14–54)
AST: 31 U/L (ref 15–41)
Albumin: 2.1 g/dL — ABNORMAL LOW (ref 3.5–5.0)
Alkaline Phosphatase: 131 U/L — ABNORMAL HIGH (ref 38–126)
Anion gap: 6 (ref 5–15)
BUN: 13 mg/dL (ref 6–20)
CHLORIDE: 114 mmol/L — AB (ref 101–111)
CO2: 21 mmol/L — AB (ref 22–32)
Calcium: 8.4 mg/dL — ABNORMAL LOW (ref 8.9–10.3)
Creatinine, Ser: 1.09 mg/dL — ABNORMAL HIGH (ref 0.44–1.00)
GFR calc Af Amer: >60 mL/min (ref >60)
GFR, EST NON AFRICAN AMERICAN: 54 mL/min — AB (ref >60)
Glucose, Bld: 58 mg/dL — ABNORMAL LOW (ref 65–99)
POTASSIUM: 4.5 mmol/L (ref 3.5–5.1)
SODIUM: 141 mmol/L (ref 135–145)
Total Bilirubin: 0.5 mg/dL (ref 0.3–1.2)
Total Protein: 5.3 g/dL — ABNORMAL LOW (ref 6.5–8.1)

## 2018-05-06 LAB — CBG MONITORING, ED
GLUCOSE-CAPILLARY: 162 mg/dL — AB (ref 65–99)
Glucose-Capillary: 41 mg/dL — CL (ref 65–99)

## 2018-05-06 LAB — URINALYSIS, ROUTINE W REFLEX MICROSCOPIC
Bilirubin Urine: NEGATIVE
GLUCOSE, UA: 100 mg/dL — AB
HGB URINE DIPSTICK: NEGATIVE
Ketones, ur: NEGATIVE mg/dL
Nitrite: NEGATIVE
PH: 6.5 (ref 5.0–8.0)
Protein, ur: NEGATIVE mg/dL
Specific Gravity, Urine: 1.015 (ref 1.005–1.030)

## 2018-05-06 LAB — LIPASE, BLOOD: LIPASE: 22 U/L (ref 11–51)

## 2018-05-06 MED ORDER — LIDOCAINE 5 % EX PTCH
1.0000 | MEDICATED_PATCH | CUTANEOUS | Status: DC
  Administered 2018-05-07 – 2018-05-12 (×6): 1 via TRANSDERMAL
  Filled 2018-05-06 (×6): qty 1

## 2018-05-06 MED ORDER — SODIUM CHLORIDE 0.9 % IV SOLN
INTRAVENOUS | Status: DC
  Administered 2018-05-06: via INTRAVENOUS

## 2018-05-06 MED ORDER — SODIUM CHLORIDE 0.9 % IV BOLUS
1000.0000 mL | Freq: Once | INTRAVENOUS | Status: AC
  Administered 2018-05-06: 1000 mL via INTRAVENOUS

## 2018-05-06 MED ORDER — DEXTROSE 50 % IV SOLN
1.0000 | Freq: Once | INTRAVENOUS | Status: AC
  Administered 2018-05-06: 50 mL via INTRAVENOUS
  Filled 2018-05-06: qty 50

## 2018-05-06 MED ORDER — LEVETIRACETAM IN NACL 500 MG/100ML IV SOLN
500.0000 mg | Freq: Two times a day (BID) | INTRAVENOUS | Status: DC
  Administered 2018-05-06 – 2018-05-07 (×2): 500 mg via INTRAVENOUS
  Filled 2018-05-06 (×2): qty 100

## 2018-05-06 MED ORDER — ONDANSETRON HCL 4 MG/2ML IJ SOLN: 4.0000 mg | Freq: Four times a day (QID) | INTRAMUSCULAR | Status: DC | PRN

## 2018-05-06 MED ORDER — TIZANIDINE HCL 4 MG PO TABS
4.0000 mg | ORAL_TABLET | Freq: Four times a day (QID) | ORAL | Status: DC | PRN
  Administered 2018-05-07 – 2018-05-08 (×2): 4 mg via ORAL
  Filled 2018-05-06 (×2): qty 1

## 2018-05-06 MED ORDER — ENOXAPARIN SODIUM 60 MG/0.6ML ~~LOC~~ SOLN
1.0000 mg/kg | Freq: Two times a day (BID) | SUBCUTANEOUS | Status: DC
  Administered 2018-05-06: 50 mg via SUBCUTANEOUS
  Filled 2018-05-06: qty 0.5

## 2018-05-06 MED ORDER — GLUCOSE 40 % PO GEL: 1.0000 | Freq: Once | ORAL | Status: DC

## 2018-05-06 MED ORDER — HYDROCORTISONE NA SUCCINATE PF 100 MG IJ SOLR
50.0000 mg | Freq: Three times a day (TID) | INTRAMUSCULAR | Status: DC
  Administered 2018-05-07 (×2): 50 mg via INTRAVENOUS
  Filled 2018-05-06 (×3): qty 2

## 2018-05-06 MED ORDER — VALPROATE SODIUM 500 MG/5ML IV SOLN
500.0000 mg | Freq: Two times a day (BID) | INTRAVENOUS | Status: DC
  Administered 2018-05-07 (×2): 500 mg via INTRAVENOUS
  Filled 2018-05-06 (×2): qty 5

## 2018-05-06 MED ORDER — FAMOTIDINE IN NACL 20-0.9 MG/50ML-% IV SOLN
20.0000 mg | Freq: Two times a day (BID) | INTRAVENOUS | Status: DC
  Administered 2018-05-06 – 2018-05-10 (×8): 20 mg via INTRAVENOUS
  Filled 2018-05-06 (×9): qty 50

## 2018-05-06 MED ORDER — FERROUS SULFATE 325 (65 FE) MG PO TABS
325.0000 mg | ORAL_TABLET | Freq: Every day | ORAL | Status: DC
  Administered 2018-05-07 – 2018-05-13 (×7): 325 mg via ORAL
  Filled 2018-05-06 (×7): qty 1

## 2018-05-06 MED ORDER — ACETAMINOPHEN 325 MG PO TABS
650.0000 mg | ORAL_TABLET | Freq: Four times a day (QID) | ORAL | Status: DC | PRN
  Administered 2018-05-06 – 2018-05-12 (×4): 650 mg via ORAL
  Filled 2018-05-06 (×3): qty 2

## 2018-05-06 MED ORDER — ATORVASTATIN CALCIUM 20 MG PO TABS
10.0000 mg | ORAL_TABLET | Freq: Every day | ORAL | Status: DC
  Administered 2018-05-07 – 2018-05-13 (×7): 10 mg via ORAL
  Filled 2018-05-06 (×7): qty 1

## 2018-05-06 MED ORDER — PANTOPRAZOLE SODIUM 40 MG PO TBEC
80.0000 mg | DELAYED_RELEASE_TABLET | Freq: Every day | ORAL | Status: DC
  Administered 2018-05-07 – 2018-05-13 (×7): 80 mg via ORAL
  Filled 2018-05-06 (×7): qty 2

## 2018-05-06 MED ORDER — MOMETASONE FURO-FORMOTEROL FUM 200-5 MCG/ACT IN AERO
2.0000 | INHALATION_SPRAY | Freq: Two times a day (BID) | RESPIRATORY_TRACT | Status: DC
Start: 2018-05-06 — End: 2018-05-14
  Administered 2018-05-07 – 2018-05-13 (×11): 2 via RESPIRATORY_TRACT
  Filled 2018-05-06 (×2): qty 8.8

## 2018-05-06 MED ORDER — INSULIN ASPART 100 UNIT/ML ~~LOC~~ SOLN: 0.0000 [IU] | Freq: Three times a day (TID) | SUBCUTANEOUS | Status: DC

## 2018-05-06 MED ORDER — GABAPENTIN 400 MG PO CAPS
800.0000 mg | ORAL_CAPSULE | Freq: Two times a day (BID) | ORAL | Status: DC
  Administered 2018-05-07 – 2018-05-08 (×2): 800 mg via ORAL
  Filled 2018-05-06 (×3): qty 2

## 2018-05-06 MED ORDER — METOCLOPRAMIDE HCL 5 MG/ML IJ SOLN
10.0000 mg | Freq: Once | INTRAMUSCULAR | Status: AC
  Administered 2018-05-06: 10 mg via INTRAVENOUS
  Filled 2018-05-06: qty 2

## 2018-05-06 MED ORDER — ALBUTEROL SULFATE (2.5 MG/3ML) 0.083% IN NEBU: 3.0000 mL | INHALATION_SOLUTION | Freq: Four times a day (QID) | RESPIRATORY_TRACT | Status: DC | PRN

## 2018-05-06 MED ORDER — FOLIC ACID 1 MG PO TABS
1.0000 mg | ORAL_TABLET | Freq: Every day | ORAL | Status: DC
  Administered 2018-05-07 – 2018-05-13 (×7): 1 mg via ORAL
  Filled 2018-05-06 (×7): qty 1

## 2018-05-06 MED ORDER — ACETAMINOPHEN 650 MG RE SUPP: 650.0000 mg | Freq: Four times a day (QID) | RECTAL | Status: DC | PRN

## 2018-05-06 MED ORDER — ONDANSETRON HCL 4 MG PO TABS
4.0000 mg | ORAL_TABLET | Freq: Four times a day (QID) | ORAL | Status: DC | PRN
  Administered 2018-05-09: 4 mg via ORAL
  Filled 2018-05-06: qty 1

## 2018-05-06 MED ORDER — METOPROLOL SUCCINATE ER 25 MG PO TB24
25.0000 mg | ORAL_TABLET | Freq: Every day | ORAL | Status: DC
  Administered 2018-05-07 – 2018-05-13 (×7): 25 mg via ORAL
  Filled 2018-05-06 (×7): qty 1

## 2018-05-06 MED ORDER — ALLOPURINOL 300 MG PO TABS
300.0000 mg | ORAL_TABLET | Freq: Every day | ORAL | Status: DC
  Administered 2018-05-07 – 2018-05-13 (×7): 300 mg via ORAL
  Filled 2018-05-06 (×7): qty 1

## 2018-05-06 MED ORDER — ASPIRIN EC 325 MG PO TBEC
325.0000 mg | DELAYED_RELEASE_TABLET | Freq: Every day | ORAL | Status: DC
  Administered 2018-05-07 – 2018-05-12 (×6): 325 mg via ORAL
  Filled 2018-05-06 (×6): qty 1

## 2018-05-06 MED ORDER — ENSURE ENLIVE PO LIQD
237.0000 mL | Freq: Two times a day (BID) | ORAL | Status: DC
  Administered 2018-05-07: 237 mL via ORAL

## 2018-05-06 MED ORDER — LACOSAMIDE 50 MG PO TABS
100.0000 mg | ORAL_TABLET | Freq: Two times a day (BID) | ORAL | Status: DC
  Administered 2018-05-07 – 2018-05-10 (×8): 100 mg via ORAL
  Filled 2018-05-06 (×8): qty 2

## 2018-05-06 NOTE — H&P (Signed)
History and Physical    Shaquona Cady OVA:[id number] DOB: May 01, 1956 DOA: 05/06/2018  PCP: [name], MD  Patient coming from: Home.  Chief Complaint: Abdominal pain nausea vomiting.  HPI: Amber [name] is a 62 y.o. female with history of chronic pancreatitis, paroxysmal atrial fibrillation, diabetes mellitus type 2, hypertension, seizures, depression, stroke presents to the ER because of persistent abdominal pain.  Patient has been abdominal pain for the last 1 week.  Pain is mostly epigastric area.  Denies any chest pain shortness of breath productive cough fever or chills.  Denies any diarrhea.  Patient had gone to [facility] on May 03, 2018 and had CAT scan done which was unremarkable.  Due to persistent pain patient comes to the ER at [facility].  Patient denies any blood in the vomitus.  Occasionally has seen some blood in the stools.  ED Course: In the ER acute abdominal series was unremarkable.  Labs does not show anything acute.  Since patient is unable to keep in anything patient admitted for further observation.  UA shows trace leukocyte esterase with few bacteria but WBC was 0.   Review of Systems: As per HPI, rest all negative.   Past Medical History:  Diagnosis Date  . Arthritis   . Asthma   . Chronic pain   . Constipation   . Diabetes mellitus without complication (HCC)   . Embolic stroke (HCC)   . Falls frequently   . GERD (gastroesophageal reflux disease)   . Hypertension   . Pneumonia 10/2015  . Seizures (HCC)    last seizure March 2015    Past Surgical History:  Procedure Laterality Date  . APPENDECTOMY    . BACK SURGERY    . CHOLECYSTECTOMY    . TEE WITHOUT CARDIOVERSION N/A 10/30/2016   Procedure: TRANSESOPHAGEAL ECHOCARDIOGRAM (TEE);  Surgeon: Lars Masson, MD;  Location: [facility] ENDOSCOPY;  Service: Cardiovascular;  Laterality: N/A;     reports that she quit smoking about 9 years ago. She has never used smokeless tobacco. She reports  that she does not drink alcohol or use drugs.  Allergies  Allergen Reactions  . Ivp Dye [Iodinated Diagnostic Agents] Itching  . Metrizamide Itching    Family History  Problem Relation Age of Onset  . Kidney failure Mother   . Hypertension Sister   . Hypertension Brother     Prior to Admission medications   Medication Sig Start Date End Date Taking? Authorizing Provider  albuterol (PROVENTIL HFA;VENTOLIN HFA) 108 (90 BASE) MCG/ACT inhaler Inhale 2 puffs into the lungs every 6 (six) hours as needed for wheezing or shortness of breath.    [provider]  allopurinol (ZYLOPRIM) 300 MG tablet Take 300 mg by mouth daily. 11/11/16   [provider]  apixaban (ELIQUIS) 5 MG TABS tablet Take 5 mg by mouth 2 (two) times daily.    [provider]  aspirin EC 325 MG EC tablet Take 1 tablet (325 mg total) by mouth daily. 10/31/16   Richarda Overlie, MD  atorvastatin (LIPITOR) 10 MG tablet Take 10 mg by mouth daily. 08/13/15   [provider]  feeding supplement, ENSURE ENLIVE, (ENSURE ENLIVE) LIQD Take 237 mLs by mouth 2 (two) times daily between meals. Patient taking differently: Take 237 mLs by mouth 2 (two) times daily between meals. strawberry 11/10/15   Catarina Hartshorn, MD  ferrous sulfate 325 (65 FE) MG tablet Take 1 tablet (325 mg total) by mouth daily with breakfast. 11/27/17  Rozann Lesches, MD  Fluticasone-Salmeterol (ADVAIR) 250-50 MCG/DOSE AEPB Inhale 2 puffs into the lungs as needed.    [provider]  folic acid (FOLVITE) 1 MG tablet Take 1 mg by mouth daily.    [provider]  gabapentin (NEURONTIN) 400 MG capsule Take 2 capsules (800 mg total) by mouth 2 (two) times daily. 11/26/17   Rozann Lesches, MD  lacosamide 100 MG TABS Take 1 tablet (100 mg total) by mouth 2 (two) times daily. 10/31/16   Richarda Overlie, MD  levETIRAcetam (KEPPRA) 500 MG tablet Take 500 mg by mouth 2 (two) times daily. 07/16/17   [provider]    lidocaine (LIDODERM) 5 % Place 1 patch onto the skin daily. Remove & Discard patch within 12 hours or as directed by MD 10/23/17   Lanelle Bal, MD  metFORMIN (GLUCOPHAGE-XR) 500 MG 24 hr tablet Take 500 mg by mouth daily. 11/11/16   [provider]  metoprolol succinate (TOPROL-XL) 25 MG 24 hr tablet Take 25 mg by mouth daily.    [provider]  oxyCODONE (OXY IR/ROXICODONE) 5 MG immediate release tablet Take 1-2 tablets (5-10 mg total) by mouth every 6 (six) hours as needed for moderate pain or severe pain. 11/26/17   Rozann Lesches, MD  pantoprazole (PROTONIX) 40 MG tablet Take 2 tablets (80 mg total) by mouth daily. 11/27/17   Rozann Lesches, MD  potassium chloride 20 MEQ TBCR Take 40 mEq by mouth daily. Patient taking differently: Take 20 mEq by mouth 2 (two) times daily.  11/25/16 10/21/17  Richarda Overlie, MD  predniSONE (DELTASONE) 10 MG tablet Take 1.5 tablets (15 mg total) by mouth daily with breakfast. 11/26/17   Nedrud, Jeanella Flattery, MD  senna-docusate (SENOKOT-S) 8.6-50 MG tablet Take 2 tablets by mouth 2 (two) times daily. 11/26/17   Rozann Lesches, MD  tiZANidine (ZANAFLEX) 4 MG tablet Take 1 tablet (4 mg total) by mouth 4 (four) times daily as needed for muscle spasms. 11/26/17   Rozann Lesches, MD  torsemide (DEMADEX) 10 MG tablet Take 10 mg by mouth daily.    [provider]  valproic acid (DEPAKENE) 250 MG capsule Take 500 mg by mouth every 12 (twelve) hours.  07/16/17   [provider]  Vitamin D, Ergocalciferol, (DRISDOL) 50000 UNITS CAPS capsule Take 1 capsule (50,000 Units total) by mouth every 7 (seven) days. Sunday Patient taking differently: Take 50,000 Units by mouth every 30 (thirty) days.  01/27/15   Calvert Cantor, MD    Physical Exam: Vitals:   05/06/18 1915 05/06/18 1945 05/06/18 2045 05/06/18 2115  BP: (!) 145/95 (!) 142/96 133/90 133/83  Pulse: 94 80 76 75  Resp: 15 13 13 17   Temp:      TempSrc:      SpO2: 100% 100% 99% 100%   Weight:      Height:          Constitutional: Moderately built and nourished. Vitals:   05/06/18 1915 05/06/18 1945 05/06/18 2045 05/06/18 2115  BP: (!) 145/95 (!) 142/96 133/90 133/83  Pulse: 94 80 76 75  Resp: 15 13 13 17   Temp:      TempSrc:      SpO2: 100% 100% 99% 100%  Weight:      Height:       Eyes: Anicteric no pallor. ENMT: No discharge from the ears eyes nose or mouth. Neck: No mass felt.  No neck rigidity.  No JVD appreciated. Respiratory: No rhonchi or crepitations. Cardiovascular: S1-S2 heard no  murmurs appreciated. Abdomen: Soft nontender bowel sounds present. Musculoskeletal: No edema.  No joint effusion. Skin: No rash.  Skin appears warm. Neurologic: Alert awake oriented to time place and person.  Moves all extremities. Psychiatric: Appears normal.  Normal affect.   Labs on Admission: I have personally reviewed following labs and imaging studies  CBC: Recent Labs  Lab 05/06/18 1741  WBC 8.1  HGB 8.7*  HCT 27.0*  MCV 78.3  PLT 164   Basic Metabolic Panel: Recent Labs  Lab 05/06/18 1741  NA 141  K 4.5  CL 114*  CO2 21*  GLUCOSE 58*  BUN 13  CREATININE 1.09*  CALCIUM 8.4*   GFR: Estimated Creatinine Clearance: 39.9 mL/min (A) (by C-G formula based on SCr of 1.09 mg/dL (H)). Liver Function Tests: Recent Labs  Lab 05/06/18 1741  AST 31  ALT 21  ALKPHOS 131*  BILITOT 0.5  PROT 5.3*  ALBUMIN 2.1*   Recent Labs  Lab 05/06/18 1741  LIPASE 22   No results for input(s): AMMONIA in the last 168 hours. Coagulation Profile: No results for input(s): INR, PROTIME in the last 168 hours. Cardiac Enzymes: No results for input(s): CKTOTAL, CKMB, CKMBINDEX, TROPONINI in the last 168 hours. BNP (last 3 results) No results for input(s): PROBNP in the last 8760 hours. HbA1C: No results for input(s): HGBA1C in the last 72 hours. CBG: Recent Labs  Lab 05/06/18 1732 05/06/18 1813  GLUCAP 41* 162*   Lipid Profile: No results for  input(s): CHOL, HDL, LDLCALC, TRIG, CHOLHDL, LDLDIRECT in the last 72 hours. Thyroid Function Tests: No results for input(s): TSH, T4TOTAL, FREET4, T3FREE, THYROIDAB in the last 72 hours. Anemia Panel: No results for input(s): VITAMINB12, FOLATE, FERRITIN, TIBC, IRON, RETICCTPCT in the last 72 hours. Urine analysis:    Component Value Date/Time   COLORURINE YELLOW 05/06/2018 1930   APPEARANCEUR CLEAR 05/06/2018 1930   LABSPEC 1.015 05/06/2018 1930   PHURINE 6.5 05/06/2018 1930   GLUCOSEU 100 (A) 05/06/2018 1930   HGBUR NEGATIVE 05/06/2018 1930   BILIRUBINUR NEGATIVE 05/06/2018 1930   KETONESUR NEGATIVE 05/06/2018 1930   PROTEINUR NEGATIVE 05/06/2018 1930   UROBILINOGEN 1.0 07/27/2015 1052   NITRITE NEGATIVE 05/06/2018 1930   LEUKOCYTESUR TRACE (A) 05/06/2018 1930   Sepsis Labs: @LABRCNTIP (procalcitonin:4,lacticidven:4) )No results found for this or any previous visit (from the past 240 hour(s)).   Radiological Exams on Admission: Dg Abd Acute W/chest  Result Date: 05/06/2018 CLINICAL DATA:  Acute generalized abdominal pain, vomiting. EXAM: DG ABDOMEN ACUTE W/ 1V CHEST COMPARISON:  Radiograph of November 22, 2017. FINDINGS: There is no evidence of dilated bowel loops or free intraperitoneal air. Status post cholecystectomy. Atherosclerosis of thoracic aorta is noted. Heart size and mediastinal contours are within normal limits. Both lungs are clear. IMPRESSION: No evidence of bowel obstruction or ileus. No acute cardiopulmonary disease. Aortic Atherosclerosis (ICD10-I70.0). Electronically Signed   By: Lupita Raider, M.D.   On: 05/06/2018 21:52     Assessment/Plan Principal Problem:   Nausea & vomiting Active Problems:   History of seizures   Diabetes mellitus type 2, controlled (HCC)   Essential hypertension   Pulmonary hypertension (HCC)   Gout   Abdominal pain   Chronic anemia    1. Persistent nausea vomiting with epigastric pain with history of chronic pancreatitis and  also had been admitted in January of this year for bowel obstruction -recent CAT scan done in May 03, 2018 3 days ago was unremarkable.  Results available in care everywhere also  I have cut and pasted towards the end of this H&P.  Acute abdominal series was unremarkable.  At this time patient is placed on Pepcid Protonix and clear liquid diet.  Closely follow. 2. History of seizures -for now I have dosed Depakote and Keppra IV and will continue lacosamide. 3. History of paroxysmal atrial fibrillation and since patient cannot take apixaban due to persistent vomiting I have dosed Lovenox for anticoagulation.  Continue metoprolol. 4. Diabetes mellitus type 2 we will keep patient on sliding scale coverage. 5. Chronic anemia -follow CBC.  On iron and folate supplements. 6. History of stroke on statins and apixaban.  Presently on Lovenox. 7. History of gout on allopurinol.   DVT prophylaxis: Lovenox. Code Status: Full code. Family Communication: Discussed with patient. Disposition Plan: Home. Consults called: None. Admission status: Observation.   Eduard Clos MD [facility] Pager [phone].  If 7PM-7AM, please contact night-coverage [email or web address] Password [email or web address]  05/06/2018, 10:45 PM

## 2018-05-06 NOTE — ED Triage Notes (Signed)
Pt reports that she was seen at [facility] 2-3 days ago and was told that she was dehydrated. Pt reports that she has had vomiting X 1 week. Pt reports increasing weakness with dark blood in her stool yesterday.

## 2018-05-06 NOTE — Progress Notes (Signed)
ANTICOAGULATION CONSULT NOTE - Initial Consult  Pharmacy Consult for lovenox Indication: atrial fibrillation  Allergies  Allergen Reactions  . Ivp Dye [Iodinated Diagnostic Agents] Itching  . Metrizamide Itching    Patient Measurements: Height: 5' (152.4 cm) Weight: 105 lb (47.6 kg) IBW/kg (Calculated) : 45.5 Heparin Dosing Weight: 47.6 kg  Vital Signs: Temp: 98.4 F (36.9 C) (06/17 1722) Temp Source: Oral (06/17 1722) BP: 133/83 (06/17 2115) Pulse Rate: 75 (06/17 2115)  Labs: Recent Labs    05/06/18 1741  HGB 8.7*  HCT 27.0*  PLT 164  CREATININE 1.09*    Estimated Creatinine Clearance: 39.9 mL/min (A) (by C-G formula based on SCr of 1.09 mg/dL (H)).   Medical History: Past Medical History:  Diagnosis Date  . Arthritis   . Asthma   . Chronic pain   . Constipation   . Diabetes mellitus without complication (HCC)   . Embolic stroke (HCC)   . Falls frequently   . GERD (gastroesophageal reflux disease)   . Hypertension   . Pneumonia 10/2015  . Seizures (HCC)    last seizure March 2015    Medications:  See medication historu  Assessment: 62 yo lady to start lovenox while unable to take po eliquis.  Her last dose eliquis was this morning. Goal of Therapy:  Therapeutic anticoagulation Monitor platelets by anticoagulation protocol: Yes   Plan:  Lovenox 50 mg sq q12 hours Monitor CBC q 3 days Monitor for bleeding complications  Amber [name] 05/06/2018,11:14 PM

## 2018-05-06 NOTE — ED Provider Notes (Signed)
[facility] EMERGENCY DEPARTMENT Provider Note   CSN: [id number] Arrival date & time: 05/06/18  1634  History   Chief Complaint Chief Complaint  Patient presents with  . Emesis    HPI Cambrea Kirt is a 62 y.o. female.  The history is provided by the patient.  Emesis   This is a new problem. The current episode started more than 2 days ago. The problem occurs 5 to 10 times per day. The problem has not changed since onset.The emesis has an appearance of stomach contents. There has been no fever. Associated symptoms include abdominal pain and myalgias. Pertinent negatives include no arthralgias, no chills, no cough, no diarrhea and no fever.    Past Medical History:  Diagnosis Date  . Arthritis   . Asthma   . Chronic pain   . Constipation   . Diabetes mellitus without complication (HCC)   . Embolic stroke (HCC)   . Falls frequently   . GERD (gastroesophageal reflux disease)   . Hypertension   . Pneumonia 10/2015  . Seizures [facility])    last seizure March 2015    Patient Active Problem List   Diagnosis Date Noted  . Small bowel obstruction (HCC) 11/25/2017  . Acute on chronic blood loss anemia 11/25/2017  . Fall   . Rib pain 10/20/2017  . PFO (patent foramen ovale) 02/15/2017  . Impacted fracture 02/15/2017  . History of CVA with residual deficit 02/15/2017  . Drug-induced hyperglycemia 02/15/2017  . Altered mental status   . Chest pain 11/23/2016  . CVA (cerebral vascular accident) (HCC) 10/28/2016  . Tachycardia   . Seizure (HCC) 10/26/2016  . Embolic stroke (HCC) 10/26/2016  . New onset atrial fibrillation (HCC) 10/26/2016  . 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 Focal seizure (HCC)   . Shoulder pain, right   . Duodenitis   . Abdominal pain 11/07/2015  . Constipation 11/07/2015  . Abdominal distension   . Chest pain, musculoskeletal 08/24/2015  . Diabetes mellitus type 2, controlled (HCC) 07/27/2015  . Chronic  back pain   . Frequent falls   . Essential hypertension   . Pulmonary hypertension (HCC)   . Gout   . Asthma 06/12/2015  . ACS (acute coronary syndrome) (HCC) 06/12/2015  . Microcytic anemia 01/26/2015  . Hypokalemia 01/26/2015  . History of seizures 01/26/2015    Past Surgical History:  Procedure Laterality Date  . APPENDECTOMY    . BACK SURGERY    . CHOLECYSTECTOMY    . TEE WITHOUT CARDIOVERSION N/A 10/30/2016   Procedure: TRANSESOPHAGEAL ECHOCARDIOGRAM (TEE);  Surgeon: Lars Masson, MD;  Location: [facility] ENDOSCOPY;  Service: Cardiovascular;  Laterality: N/A;     OB History   None      Home Medications    Prior to Admission medications   Medication Sig Start Date End Date Taking? Authorizing Provider  albuterol (PROVENTIL HFA;VENTOLIN HFA) 108 (90 BASE) MCG/ACT inhaler Inhale 2 puffs into the lungs every 6 (six) hours as needed for wheezing or shortness of breath.    [provider]  allopurinol (ZYLOPRIM) 300 MG tablet Take 300 mg by mouth daily. 11/11/16   [provider]  apixaban (ELIQUIS) 5 MG TABS tablet Take 5 mg by mouth 2 (two) times daily.    [provider]  aspirin EC 325 MG EC tablet Take 1 tablet (325 mg total) by mouth daily. 10/31/16   Richarda Overlie, MD  atorvastatin (LIPITOR) 10 MG tablet Take 10 mg by  mouth daily. 08/13/15   [provider]  feeding supplement, ENSURE ENLIVE, (ENSURE ENLIVE) LIQD Take 237 mLs by mouth 2 (two) times daily between meals. Patient taking differently: Take 237 mLs by mouth 2 (two) times daily between meals. strawberry 11/10/15   Catarina Hartshorn, MD  ferrous sulfate 325 (65 FE) MG tablet Take 1 tablet (325 mg total) by mouth daily with breakfast. 11/27/17   Nedrud, Jeanella Flattery, MD  Fluticasone-Salmeterol (ADVAIR) 250-50 MCG/DOSE AEPB Inhale 2 puffs into the lungs as needed.    [provider]  folic acid (FOLVITE) 1 MG tablet Take 1 mg by mouth daily.    [provider]  gabapentin  (NEURONTIN) 400 MG capsule Take 2 capsules (800 mg total) by mouth 2 (two) times daily. 11/26/17   Rozann Lesches, MD  lacosamide 100 MG TABS Take 1 tablet (100 mg total) by mouth 2 (two) times daily. 10/31/16   Richarda Overlie, MD  levETIRAcetam (KEPPRA) 500 MG tablet Take 500 mg by mouth 2 (two) times daily. 07/16/17   [provider]  lidocaine (LIDODERM) 5 % Place 1 patch onto the skin daily. Remove & Discard patch within 12 hours or as directed by MD 10/23/17   Lanelle Bal, MD  metFORMIN (GLUCOPHAGE-XR) 500 MG 24 hr tablet Take 500 mg by mouth daily. 11/11/16   [provider]  metoprolol succinate (TOPROL-XL) 25 MG 24 hr tablet Take 25 mg by mouth daily.    [provider]  oxyCODONE (OXY IR/ROXICODONE) 5 MG immediate release tablet Take 1-2 tablets (5-10 mg total) by mouth every 6 (six) hours as needed for moderate pain or severe pain. 11/26/17   Rozann Lesches, MD  pantoprazole (PROTONIX) 40 MG tablet Take 2 tablets (80 mg total) by mouth daily. 11/27/17   Rozann Lesches, MD  potassium chloride 20 MEQ TBCR Take 40 mEq by mouth daily. Patient taking differently: Take 20 mEq by mouth 2 (two) times daily.  11/25/16 10/21/17  Richarda Overlie, MD  predniSONE (DELTASONE) 10 MG tablet Take 1.5 tablets (15 mg total) by mouth daily with breakfast. 11/26/17   Nedrud, Jeanella Flattery, MD  senna-docusate (SENOKOT-S) 8.6-50 MG tablet Take 2 tablets by mouth 2 (two) times daily. 11/26/17   Rozann Lesches, MD  tiZANidine (ZANAFLEX) 4 MG tablet Take 1 tablet (4 mg total) by mouth 4 (four) times daily as needed for muscle spasms. 11/26/17   Rozann Lesches, MD  torsemide (DEMADEX) 10 MG tablet Take 10 mg by mouth daily.    [provider]  valproic acid (DEPAKENE) 250 MG capsule Take 500 mg by mouth every 12 (twelve) hours.  07/16/17   [provider]  Vitamin D, Ergocalciferol, (DRISDOL) 50000 UNITS CAPS capsule Take 1 capsule (50,000 Units total) by mouth every 7 (seven)  days. Sunday Patient taking differently: Take 50,000 Units by mouth every 30 (thirty) days.  01/27/15   Calvert Cantor, MD    Family History Family History  Problem Relation Age of Onset  . Kidney failure Mother   . Hypertension Sister   . Hypertension Brother     Social History Social History   Tobacco Use  . Smoking status: Former Smoker    Last attempt to quit: 2010    Years since quitting: 9.4  . Smokeless tobacco: Never Used  Substance Use Topics  . Alcohol use: No    Comment: admits to 2 drinks/week  . Drug use: No     Allergies   Ivp dye [iodinated diagnostic agents] and Metrizamide  Review of Systems Review of Systems  Constitutional: Positive for appetite change (decreased). Negative for chills and fever.  HENT: Negative for ear pain and sore throat.   Eyes: Negative for pain and visual disturbance.  Respiratory: Negative for cough and shortness of breath.   Cardiovascular: Negative for chest pain and palpitations.  Gastrointestinal: Positive for abdominal pain, blood in stool and vomiting. Negative for diarrhea.  Genitourinary: Negative for dysuria and hematuria.  Musculoskeletal: Positive for myalgias. Negative for arthralgias and back pain.  Skin: Negative for color change and rash.  Neurological: Positive for weakness (generalized). Negative for seizures and syncope.  All other systems reviewed and are negative.    Physical Exam Updated Vital Signs BP 133/90   Pulse 76   Temp 98.4 F (36.9 C) (Oral)   Resp 13   Ht 5' (1.524 m)   Wt 47.6 kg (105 lb)   SpO2 99%   BMI 20.51 kg/m   Physical Exam  Constitutional: She is oriented to person, place, and time. She appears well-developed and well-nourished. No distress.  HENT:  Head: Normocephalic and atraumatic.  Mouth/Throat: Mucous membranes are dry.  Eyes: Conjunctivae and EOM are normal.  Neck: Neck supple.  Cardiovascular: Normal rate, regular rhythm and intact distal pulses. Exam reveals no  friction rub.  No murmur heard. Pulmonary/Chest: Effort normal and breath sounds normal. No stridor. No respiratory distress.  Abdominal: Soft. She exhibits no distension and no mass. There is tenderness in the epigastric area and left upper quadrant. There is no rebound and no guarding.  Musculoskeletal: She exhibits no edema.  Neurological: She is alert and oriented to person, place, and time.  Skin: Skin is warm and dry.  Psychiatric: She has a normal mood and affect.  Nursing note and vitals reviewed.    ED Treatments / Results  Labs (all labs ordered are listed, but only abnormal results are displayed) Labs Reviewed  COMPREHENSIVE METABOLIC PANEL - Abnormal; Notable for the following components:      Result Value   Chloride 114 (*)    CO2 21 (*)    Glucose, Bld 58 (*)    Creatinine, Ser 1.09 (*)    Calcium 8.4 (*)    Total Protein 5.3 (*)    Albumin 2.1 (*)    Alkaline Phosphatase 131 (*)    GFR calc non Af Amer 54 (*)    All other components within normal limits  CBC - Abnormal; Notable for the following components:   RBC 3.45 (*)    Hemoglobin 8.7 (*)    HCT 27.0 (*)    MCH 25.2 (*)    RDW 15.7 (*)    All other components within normal limits  URINALYSIS, ROUTINE W REFLEX MICROSCOPIC - Abnormal; Notable for the following components:   Glucose, UA 100 (*)    Leukocytes, UA TRACE (*)    All other components within normal limits  URINALYSIS, MICROSCOPIC (REFLEX) - Abnormal; Notable for the following components:   Bacteria, UA FEW (*)    All other components within normal limits  CBG MONITORING, ED - Abnormal; Notable for the following components:   Glucose-Capillary 41 (*)    All other components within normal limits  CBG MONITORING, ED - Abnormal; Notable for the following components:   Glucose-Capillary 162 (*)    All other components within normal limits  LIPASE, BLOOD    EKG None  Radiology No results found.  Procedures Procedures (including critical  care time)  Medications Ordered in ED  Medications  famotidine (PEPCID) IVPB 20 mg premix (0 mg Intravenous Stopped 05/06/18 1906)  dextrose 50 % solution 50 mL (50 mLs Intravenous Given 05/06/18 1749)  sodium chloride 0.9 % bolus 1,000 mL (1,000 mLs Intravenous New Bag/Given 05/06/18 1836)  metoCLOPramide (REGLAN) injection 10 mg (10 mg Intravenous Given 05/06/18 1836)     Initial Impression / Assessment and Plan / ED Course  I have reviewed the triage vital signs and the nursing notes.  Pertinent labs & imaging results that were available during my care of the patient were reviewed by me and considered in my medical decision making (see chart for details).      Yehudis Podraza is a 62 y.o. female with PMHx of NIDDM, a fib (on eliquis), past heavy EtOH use with pancreatitis who p/w vomiting x 1 wk. Unable to tolerate any PO. Accompanied by epigastric "soreness". Reviewed and confirmed nursing documentation for past medical history, family history, social history. VS afebrile, wnl. Exam remarkable for epigastric/LUQ tenderness with no peritoneal signs, dry mucous membranes. Ddx includes gastroparesis, pancreatitis, gastritis, duodenitis. Normal CT abd/pelvis 3 days ago, no indication for repeat imaging at this time.   Hypoglycemic to 41 on arrival. Received bolus d50 with improvement to 160s. UA without evidence of infection. Lipase wnl.  CMP with hypoglycemia 58, AKI with Cr 1.09, CBC with no leukocytosis, normocytic anemia with with hgb 8.7 (similar to baseline). Minimally improved with IVF, reglan, pepcid.   Old records reviewed. Labs reviewed by me and used in the medical decision making. Admitted to hospitalist.    Final Clinical Impressions(s) / ED Diagnoses   Final diagnoses:  Nausea and vomiting, intractability of vomiting not specified, unspecified vomiting type  Hypoglycemia    ED Discharge Orders    None       Diannia Ruder, MD 05/06/18 2124    Cathren Laine, MD 05/06/18  2148

## 2018-05-06 NOTE — ED Provider Notes (Signed)
Patient placed in Quick Look pathway, seen and evaluated   Chief Complaint: weakness vomiting   HPI:   Pt reports she is weak and vomiting   ROS: constipation  Physical Exam:   Gen: No distress  Neuro: Awake and Alert  Skin: Warm    Focused Exam: lungs clear, Heart RRR   Initiation of care has begun. The patient has been counseled on the process, plan, and necessity for staying for the completion/evaluation, and the remainder of the medical screening examination   Osie Cheeks 05/06/18 1735    Wynetta Fines, MD 05/06/18 [phone]

## 2018-05-07 DIAGNOSIS — E440 Moderate protein-calorie malnutrition: Secondary | ICD-10-CM | POA: Diagnosis not present

## 2018-05-07 DIAGNOSIS — R569 Unspecified convulsions: Secondary | ICD-10-CM | POA: Diagnosis not present

## 2018-05-07 DIAGNOSIS — I10 Essential (primary) hypertension: Secondary | ICD-10-CM

## 2018-05-07 DIAGNOSIS — I480 Paroxysmal atrial fibrillation: Secondary | ICD-10-CM | POA: Diagnosis present

## 2018-05-07 DIAGNOSIS — E1121 Type 2 diabetes mellitus with diabetic nephropathy: Secondary | ICD-10-CM

## 2018-05-07 DIAGNOSIS — R112 Nausea with vomiting, unspecified: Secondary | ICD-10-CM | POA: Diagnosis not present

## 2018-05-07 LAB — CBC
HCT: 25.6 % — ABNORMAL LOW (ref 36.0–46.0)
Hemoglobin: 8.1 g/dL — ABNORMAL LOW (ref 12.0–15.0)
MCH: 25 pg — AB (ref 26.0–34.0)
MCHC: 31.6 g/dL (ref 30.0–36.0)
MCV: 79 fL (ref 78.0–100.0)
PLATELETS: 175 10*3/uL (ref 150–400)
RBC: 3.24 MIL/uL — ABNORMAL LOW (ref 3.87–5.11)
RDW: 15.5 % (ref 11.5–15.5)
WBC: 5.7 10*3/uL (ref 4.0–10.5)

## 2018-05-07 LAB — GLUCOSE, CAPILLARY
GLUCOSE-CAPILLARY: 174 mg/dL — AB (ref 65–99)
GLUCOSE-CAPILLARY: 178 mg/dL — AB (ref 65–99)
GLUCOSE-CAPILLARY: 45 mg/dL — AB (ref 65–99)
Glucose-Capillary: 56 mg/dL — ABNORMAL LOW (ref 65–99)
Glucose-Capillary: 73 mg/dL (ref 65–99)
Glucose-Capillary: 120 mg/dL — ABNORMAL HIGH (ref 65–99)
Glucose-Capillary: 216 mg/dL — ABNORMAL HIGH (ref 65–99)
Glucose-Capillary: 342 mg/dL — ABNORMAL HIGH (ref 65–99)
Glucose-Capillary: 368 mg/dL — ABNORMAL HIGH (ref 65–99)

## 2018-05-07 LAB — HEPATIC FUNCTION PANEL
ALT: 20 U/L (ref 14–54)
AST: 35 U/L (ref 15–41)
Albumin: 1.8 g/dL — ABNORMAL LOW (ref 3.5–5.0)
Alkaline Phosphatase: 117 U/L (ref 38–126)
BILIRUBIN DIRECT: 0.2 mg/dL (ref 0.1–0.5)
BILIRUBIN INDIRECT: 0.3 mg/dL (ref 0.3–0.9)
TOTAL PROTEIN: 4.6 g/dL — AB (ref 6.5–8.1)
Total Bilirubin: 0.5 mg/dL (ref 0.3–1.2)

## 2018-05-07 LAB — BASIC METABOLIC PANEL
Anion gap: 6 (ref 5–15)
BUN: 11 mg/dL (ref 6–20)
CALCIUM: 7.8 mg/dL — AB (ref 8.9–10.3)
CO2: 18 mmol/L — ABNORMAL LOW (ref 22–32)
CREATININE: 0.97 mg/dL (ref 0.44–1.00)
Chloride: 114 mmol/L — ABNORMAL HIGH (ref 101–111)
GFR calc Af Amer: >60 mL/min (ref >60)
GFR calc non Af Amer: >60 mL/min (ref >60)
Glucose, Bld: 157 mg/dL — ABNORMAL HIGH (ref 65–99)
Potassium: 4.5 mmol/L (ref 3.5–5.1)
SODIUM: 138 mmol/L (ref 135–145)

## 2018-05-07 LAB — MRSA PCR SCREENING: MRSA by PCR: POSITIVE — AB

## 2018-05-07 LAB — VALPROIC ACID LEVEL: Valproic Acid Lvl: 49 ug/mL — ABNORMAL LOW (ref 50.0–100.0)

## 2018-05-07 MED ORDER — MUPIROCIN 2 % EX OINT
1.0000 "application " | TOPICAL_OINTMENT | Freq: Two times a day (BID) | CUTANEOUS | Status: AC
  Administered 2018-05-07 – 2018-05-11 (×10): 1 via NASAL
  Filled 2018-05-07 (×2): qty 22

## 2018-05-07 MED ORDER — MORPHINE SULFATE (PF) 2 MG/ML IV SOLN
1.0000 mg | INTRAVENOUS | Status: DC | PRN
  Administered 2018-05-07: 1 mg via INTRAVENOUS
  Filled 2018-05-07: qty 1

## 2018-05-07 MED ORDER — POTASSIUM CHLORIDE CRYS ER 20 MEQ PO TBCR
20.0000 meq | EXTENDED_RELEASE_TABLET | Freq: Two times a day (BID) | ORAL | Status: DC
  Administered 2018-05-07 – 2018-05-10 (×6): 20 meq via ORAL
  Filled 2018-05-07 (×6): qty 1

## 2018-05-07 MED ORDER — INSULIN ASPART 100 UNIT/ML ~~LOC~~ SOLN
5.0000 [IU] | Freq: Once | SUBCUTANEOUS | Status: AC
  Administered 2018-05-08: 5 [IU] via SUBCUTANEOUS

## 2018-05-07 MED ORDER — LEVETIRACETAM 500 MG PO TABS
500.0000 mg | ORAL_TABLET | Freq: Two times a day (BID) | ORAL | Status: DC
  Administered 2018-05-07 – 2018-05-10 (×6): 500 mg via ORAL
  Filled 2018-05-07 (×6): qty 1

## 2018-05-07 MED ORDER — APIXABAN 5 MG PO TABS
5.0000 mg | ORAL_TABLET | Freq: Two times a day (BID) | ORAL | Status: DC
  Administered 2018-05-07 – 2018-05-12 (×10): 5 mg via ORAL
  Filled 2018-05-07 (×10): qty 1

## 2018-05-07 MED ORDER — VALPROIC ACID 250 MG PO CAPS
500.0000 mg | ORAL_CAPSULE | Freq: Two times a day (BID) | ORAL | Status: DC
  Administered 2018-05-07 – 2018-05-10 (×6): 500 mg via ORAL
  Filled 2018-05-07 (×6): qty 2

## 2018-05-07 MED ORDER — CHLORHEXIDINE GLUCONATE CLOTH 2 % EX PADS
6.0000 | MEDICATED_PAD | Freq: Every day | CUTANEOUS | Status: AC
  Administered 2018-05-07 – 2018-05-11 (×4): 6 via TOPICAL

## 2018-05-07 MED ORDER — PREDNISONE 10 MG PO TABS
15.0000 mg | ORAL_TABLET | Freq: Every day | ORAL | Status: DC
  Administered 2018-05-08: 15 mg via ORAL
  Filled 2018-05-07: qty 1

## 2018-05-07 MED ORDER — OXYCODONE HCL 5 MG PO TABS
5.0000 mg | ORAL_TABLET | Freq: Four times a day (QID) | ORAL | Status: DC | PRN
  Administered 2018-05-07 – 2018-05-09 (×5): 10 mg via ORAL
  Filled 2018-05-07 (×4): qty 2
  Filled 2018-05-07 (×2): qty 1

## 2018-05-07 MED ORDER — ADULT MULTIVITAMIN W/MINERALS CH
1.0000 | ORAL_TABLET | Freq: Every day | ORAL | Status: DC
  Administered 2018-05-07 – 2018-05-13 (×7): 1 via ORAL
  Filled 2018-05-07 (×7): qty 1

## 2018-05-07 MED ORDER — POTASSIUM CHLORIDE ER 20 MEQ PO TBCR: 20.0000 meq | EXTENDED_RELEASE_TABLET | Freq: Two times a day (BID) | ORAL | Status: DC

## 2018-05-07 MED ORDER — ENOXAPARIN SODIUM 40 MG/0.4ML ~~LOC~~ SOLN
40.0000 mg | Freq: Two times a day (BID) | SUBCUTANEOUS | Status: DC
  Administered 2018-05-07: 40 mg via SUBCUTANEOUS
  Filled 2018-05-07: qty 0.4

## 2018-05-07 MED ORDER — BOOST / RESOURCE BREEZE PO LIQD CUSTOM
1.0000 | Freq: Three times a day (TID) | ORAL | Status: DC
  Administered 2018-05-07 – 2018-05-08 (×3): 1 via ORAL

## 2018-05-07 NOTE — Progress Notes (Signed)
PROGRESS NOTE  Amber [name] GNF:[id number] DOB: Jul 01, 1956 DOA: 05/06/2018 PCP: [name], MD  Brief Narrative: 62 year old woman PMH chronic pancreatitis presented with abdominal pain, nausea, vomiting.  Assessment/Plan  Epigastric abdominal pain with associated nausea and vomiting.  CT abdomen pelvis from outside facility 6/14 no acute abnormalities.  Chronic pancreatitis noted.  Lipase, LFTs unremarkable here.  May represent acute on chronic pain from chronic pancreatitis. --Nausea and vomiting have resolved.  Abdominal exam is benign.  Tolerating liquid diet.  Plan advance diet.  Paroxysmal atrial fibrillation --Stable.  Continue apixaban, metoprolol  Diabetes mellitus type 2 --Stable.  Continue sliding scale insulin.  Seizure disorder --Continue Keppra, Depakote, lacosamide  Chronic normocytic anemia stable --Stable.  Moderate malnutrition --Per dietitian recommendations    Advance diet.  May be able to go home tomorrow if continues to improve.  DVT prophylaxis: apixaban Code Status: Full Family Communication: none Disposition Plan: home    Brendia Sacks, MD  [facility] Direct contact: [phone] --Via amion app OR  --[email or web address]; password [email or web address]  7PM-7AM contact night coverage as above 05/07/2018, 6:07 PM  LOS: 0 days   Consultants:    Procedures:    Antimicrobials:    Interval history/Subjective: Feels much better, less abd pain, no n/v. Tolerating liquids. Wants real food.  Objective: Vitals:  Vitals:   05/07/18 0606 05/07/18 1508  BP: (!) 138/94 (!) 150/97  Pulse: 69 87  Resp: 16 20  Temp: (!) 97.4 F (36.3 C)   SpO2: 100% 100%    Exam:  Constitutional:  . Appears calm and comfortable Respiratory:  . CTA bilaterally, no w/r/r.  . Respiratory effort normal.  Cardiovascular:  . RRR, no m/r/g . No LE extremity edema   Abdomen:  . Soft, nd, mild epigastric tenderness Psychiatric:  . Mental status o Mood, affect  appropriate  I have personally reviewed the following:   Labs:  Blood sugars elevated  CMP unremarkable.  Lipase was normal on admission.  Hemoglobin 8.1, appears to be at baseline.  Imaging studies:  Chest x-ray no acute disease  Recent CT noted, see bottom of note  Scheduled Meds: . allopurinol  300 mg Oral Daily  . apixaban  5 mg Oral BID  . aspirin  325 mg Oral Daily  . atorvastatin  10 mg Oral Daily  . Chlorhexidine Gluconate Cloth  6 each Topical Q0600  . feeding supplement  1 Container Oral TID BM  . ferrous sulfate  325 mg Oral Q breakfast  . folic acid  1 mg Oral Daily  . gabapentin  800 mg Oral BID  . lacosamide  100 mg Oral BID  . levETIRAcetam  500 mg Oral BID  . lidocaine  1 patch Transdermal Q24H  . metoprolol succinate  25 mg Oral Daily  . mometasone-formoterol  2 puff Inhalation BID  . multivitamin with minerals  1 tablet Oral Daily  . mupirocin ointment  1 application Nasal BID  . pantoprazole  80 mg Oral Daily  . potassium chloride  20 mEq Oral BID  . [START ON 05/08/2018] predniSONE  15 mg Oral Q breakfast  . valproic acid  500 mg Oral Q12H   Continuous Infusions: . famotidine (PEPCID) IV 20 mg (05/07/18 1049)    Principal Problem:   Nausea & vomiting Active Problems:   Diabetes mellitus type 2, controlled (HCC)   Essential hypertension   Abdominal pain   Focal seizure (HCC)   Chronic anemia   Moderate malnutrition (HCC)   PAF (paroxysmal atrial fibrillation) (HCC)  LOS: 0 days     CLINICAL DATA:Upper abdominal pain. History of pancreatitis. Some nausea and vomiting  EXAM: CT ABDOMEN AND PELVIS WITHOUT CONTRAST  TECHNIQUE: Multidetector CT imaging of the abdomen and pelvis was performed following the standard protocol without IV contrast.  COMPARISON:CT abdomen dated 01/12/2018.  FINDINGS: Lower chest: No acute abnormality.  Hepatobiliary: No focal liver abnormality is seen. Status post cholecystectomy. No biliary  dilatation.  Pancreas: Calcifications throughout the pancreas, indicating sequela of chronic pancreatitis. No peripancreatic fluid or other signs of acute pancreatitis at this time.  Spleen: Normal in size without focal abnormality.  Adrenals/Urinary Tract: Adrenal glands appear normal. Kidneys are unremarkable without mass, stone or hydronephrosis. No ureteral or bladder calculi identified. Bladder appears normal.  Stomach/Bowel: Bowel appears normal in caliber. No evidence of acute bowel wall thickening or bowel wall inflammation seen. No pericolonic inflammation. Scattered mild colonic diverticulosis without evidence of acute diverticulitis. Stomach is unremarkable, partially decompressed.  Vascular/Lymphatic: Aortic atherosclerosis. No enlarged abdominal or pelvic lymph nodes.  Reproductive: No mass or free fluid within either adnexal region.  Other: No free fluid or abscess collection. No free intraperitoneal air.  Musculoskeletal: No acute or suspicious osseous finding. Chronic compression fracture deformities of the T12, L1 and L3 vertebral bodies, unchanged.  IMPRESSION: 1. No acute findings within the abdomen or pelvis. No bowel obstruction or evidence of acute bowel wall inflammation. No renal or ureteral calculi. No free fluid. No CT evidence of acute pancreatitis. 2. Mild colonic diverticulosis without evidence of acute diverticulitis. 3. Sequela of chronic pancreatitis. Again, no evidence of acute pancreatitis at this time. 4. Aortic atherosclerosis. 5. Chronic compression fracture deformities of the T12, L1 and L3 vertebral bodies, stable.   Electronically Signed By: Ledell Peoples M.D. On: 05/03/2018 14:40

## 2018-05-07 NOTE — Progress Notes (Signed)
ANTICOAGULATION CONSULT NOTE - Follow-Up Consult  Pharmacy Consult for lovenox Indication: atrial fibrillation  Allergies  Allergen Reactions  . Ivp Dye [Iodinated Diagnostic Agents] Itching  . Metrizamide Itching    Patient Measurements: Height: 5' (152.4 cm) Weight: 96 lb 11.2 oz (43.9 kg) IBW/kg (Calculated) : 45.5   Vital Signs: Temp: 97.4 F (36.3 C) (06/18 0606) Temp Source: Oral (06/18 0606) BP: 138/94 (06/18 0606) Pulse Rate: 69 (06/18 0606)  Labs: Recent Labs    05/06/18 1741 05/07/18 0335  HGB 8.7* 8.1*  HCT 27.0* 25.6*  PLT 164 175  CREATININE 1.09* 0.97    Estimated Creatinine Clearance: 43.3 mL/min (by C-G formula based on SCr of 0.97 mg/dL).   Medical History: Past Medical History:  Diagnosis Date  . Arthritis   . Asthma   . Chronic pain   . Constipation   . Diabetes mellitus without complication (HCC)   . Embolic stroke (HCC)   . Falls frequently   . GERD (gastroesophageal reflux disease)   . Hypertension   . Pneumonia 10/2015  . Seizures (HCC)    last seizure March 2015    Medications:    Assessment: Pt initiated on Lovenox 6/17 PM while off Eliquis due to NPO.  Noted updated weight of 43 kg.  Will adjust Lovenox dosing.  Goal of Therapy:  Therapeutic anticoagulation Monitor platelets by anticoagulation protocol: Yes   Plan:  Lovenox 40 mg sq q12 hours Monitor CBC q 3 days Monitor for bleeding complications  Obadiah Dennard, Pharm.D., BCPS Clinical Pharmacist Pager: [phone] Clinical phone for 05/07/2018 from 8:30-4:00 is [phone]. After 4pm, please call Main Rx [phone]) for assistance. 05/07/2018 10:16 AM

## 2018-05-07 NOTE — Progress Notes (Signed)
Results for Amber [name], Amber [name] (MRN [id number]) as of 05/07/2018 02:41  Ref. Range 05/07/2018 00:33 05/07/2018 00:58 05/07/2018 01:25  Glucose-Capillary Latest Ref Range: 65 - 99 mg/dL 45 (L) 56 (L) 73   Pt arrived to 5W06 from ER hypoglycemic as above, asymptomatic, and  tolerating clear liquid diet. On call provider, Merdis Delay, NP made aware. Will continue to monitor CBG q2hrs. Pt oriented to room, call bell, and unit routine.

## 2018-05-07 NOTE — Progress Notes (Signed)
Initial Nutrition Assessment  DOCUMENTATION CODES:   Non-severe (moderate) malnutrition in context of chronic illness  INTERVENTION:  Continue Boost Breeze po TID, each supplement provides 250 kcal and 9 grams of protein  Provided High Calorie, High Protein Nutrition Therapy handout from the Academy of [employer] for patient.  MVI with minerals  NUTRITION DIAGNOSIS:   Moderate Malnutrition related to chronic illness(Chronic Pancreatitis) as evidenced by moderate fat depletion, moderate muscle depletion.  GOAL:   Patient will meet greater than or equal to 90% of their needs  MONITOR:   Diet advancement, PO intake, Weight trends  REASON FOR ASSESSMENT:   Malnutrition Screening Tool    ASSESSMENT:   Patient with PMH chronic pancreatitis, DM2, HTN, depression, stroke, presents with abdominal pain x1 week, nausea and vomiting.   Spoke with Ms. Thomure at bedside.  She had a clear liquid tray in front of her and also had recently consumed a boost breeze. Patient states her appetite is good, was asking for mashed potatoes during visit.  She reports poor appetite and no PO intake x1 week related to nausea and vomiting. States before this she was eating 3-4 times a day and drinking 2 ensure a day, trying to gain weight.   Patient reports a UBW of 156 pounds with weight loss over the past 2 months due to a stroke. It appears she was admitted to [facility] for a stroke last year in March and April, but nothing recently.  Per chart she was 116 pounds 5 months ago, and has slowly lost 20 pounds/17% of her total body weight which is severe for timeframe.  Patient requested information on eating to gain weight. Provided "High Calorie, High Protein nutrition therapy" handout from the [employer]. Discussed eating higher fat foods, encouraged intake of meat, milk, eggs, cheese, and beans. Also encouraged her to continue consuming Ensure BID.  Labs  reviewed  Medications reviewed and include:  Iron, Folic Acid NS at 59mL/hr   NUTRITION - FOCUSED PHYSICAL EXAM:    Most Recent Value  Orbital Region  Mild depletion  Upper Arm Region  Moderate depletion  Thoracic and Lumbar Region  Moderate depletion  Buccal Region  No depletion  Temple Region  No depletion  Clavicle Bone Region  Moderate depletion  Clavicle and Acromion Bone Region  Moderate depletion  Scapular Bone Region  Moderate depletion  Dorsal Hand  No depletion  Patellar Region  Moderate depletion  Anterior Thigh Region  Moderate depletion  Posterior Calf Region  Moderate depletion       Diet Order:   Diet Order           Diet clear liquid Room service appropriate? Yes; Fluid consistency: Thin  Diet effective now          EDUCATION NEEDS:   Education needs have been addressed  Skin:  Skin Assessment: Reviewed RN Assessment  Last BM:  05/05/2018  Height:   Ht Readings from Last 1 Encounters:  05/07/18 5' (1.524 m)    Weight:   Wt Readings from Last 1 Encounters:  05/07/18 96 lb 11.2 oz (43.9 kg)    Ideal Body Weight:  45.45 kg  BMI:  Body mass index is 18.89 kg/m.  Estimated Nutritional Needs:   Kcal:  3888-2800 calories (30-35 cal/kg)  Protein:  65-74 grams (1.5-1.7g/kg)  Fluid:  >1.5L    Dionne Ano. Hindy Perrault, MS, RD LDN Inpatient Clinical Dietitian Pager [phone]

## 2018-05-08 DIAGNOSIS — E1121 Type 2 diabetes mellitus with diabetic nephropathy: Secondary | ICD-10-CM | POA: Diagnosis not present

## 2018-05-08 DIAGNOSIS — T426X5A Adverse effect of other antiepileptic and sedative-hypnotic drugs, initial encounter: Secondary | ICD-10-CM | POA: Diagnosis present

## 2018-05-08 DIAGNOSIS — K861 Other chronic pancreatitis: Secondary | ICD-10-CM | POA: Diagnosis present

## 2018-05-08 DIAGNOSIS — G9341 Metabolic encephalopathy: Secondary | ICD-10-CM

## 2018-05-08 DIAGNOSIS — R251 Tremor, unspecified: Secondary | ICD-10-CM

## 2018-05-08 DIAGNOSIS — E440 Moderate protein-calorie malnutrition: Secondary | ICD-10-CM | POA: Diagnosis present

## 2018-05-08 DIAGNOSIS — R112 Nausea with vomiting, unspecified: Secondary | ICD-10-CM | POA: Diagnosis present

## 2018-05-08 DIAGNOSIS — D649 Anemia, unspecified: Secondary | ICD-10-CM | POA: Diagnosis not present

## 2018-05-08 DIAGNOSIS — N183 Chronic kidney disease, stage 3 (moderate): Secondary | ICD-10-CM | POA: Diagnosis present

## 2018-05-08 DIAGNOSIS — Z87898 Personal history of other specified conditions: Secondary | ICD-10-CM

## 2018-05-08 DIAGNOSIS — E1165 Type 2 diabetes mellitus with hyperglycemia: Secondary | ICD-10-CM | POA: Diagnosis present

## 2018-05-08 DIAGNOSIS — Z681 Body mass index (BMI) 19 or less, adult: Secondary | ICD-10-CM | POA: Diagnosis not present

## 2018-05-08 DIAGNOSIS — K5790 Diverticulosis of intestine, part unspecified, without perforation or abscess without bleeding: Secondary | ICD-10-CM | POA: Diagnosis present

## 2018-05-08 DIAGNOSIS — E872 Acidosis: Secondary | ICD-10-CM | POA: Diagnosis present

## 2018-05-08 DIAGNOSIS — G40109 Localization-related (focal) (partial) symptomatic epilepsy and epileptic syndromes with simple partial seizures, not intractable, without status epilepticus: Secondary | ICD-10-CM | POA: Diagnosis present

## 2018-05-08 DIAGNOSIS — I10 Essential (primary) hypertension: Secondary | ICD-10-CM | POA: Diagnosis not present

## 2018-05-08 DIAGNOSIS — Z22322 Carrier or suspected carrier of Methicillin resistant Staphylococcus aureus: Secondary | ICD-10-CM

## 2018-05-08 DIAGNOSIS — G92 Toxic encephalopathy: Secondary | ICD-10-CM | POA: Diagnosis present

## 2018-05-08 DIAGNOSIS — D631 Anemia in chronic kidney disease: Secondary | ICD-10-CM | POA: Diagnosis present

## 2018-05-08 DIAGNOSIS — G253 Myoclonus: Secondary | ICD-10-CM | POA: Diagnosis present

## 2018-05-08 DIAGNOSIS — N179 Acute kidney failure, unspecified: Secondary | ICD-10-CM | POA: Diagnosis present

## 2018-05-08 DIAGNOSIS — T402X5A Adverse effect of other opioids, initial encounter: Secondary | ICD-10-CM | POA: Diagnosis present

## 2018-05-08 DIAGNOSIS — T428X5A Adverse effect of antiparkinsonism drugs and other central muscle-tone depressants, initial encounter: Secondary | ICD-10-CM | POA: Diagnosis present

## 2018-05-08 DIAGNOSIS — I272 Pulmonary hypertension, unspecified: Secondary | ICD-10-CM | POA: Diagnosis present

## 2018-05-08 DIAGNOSIS — E162 Hypoglycemia, unspecified: Secondary | ICD-10-CM | POA: Diagnosis present

## 2018-05-08 DIAGNOSIS — E1122 Type 2 diabetes mellitus with diabetic chronic kidney disease: Secondary | ICD-10-CM | POA: Diagnosis present

## 2018-05-08 DIAGNOSIS — J4530 Mild persistent asthma, uncomplicated: Secondary | ICD-10-CM | POA: Diagnosis present

## 2018-05-08 DIAGNOSIS — M109 Gout, unspecified: Secondary | ICD-10-CM | POA: Diagnosis present

## 2018-05-08 DIAGNOSIS — I480 Paroxysmal atrial fibrillation: Secondary | ICD-10-CM | POA: Diagnosis present

## 2018-05-08 DIAGNOSIS — Y92230 Patient room in hospital as the place of occurrence of the external cause: Secondary | ICD-10-CM | POA: Diagnosis present

## 2018-05-08 DIAGNOSIS — E875 Hyperkalemia: Secondary | ICD-10-CM | POA: Diagnosis not present

## 2018-05-08 DIAGNOSIS — I129 Hypertensive chronic kidney disease with stage 1 through stage 4 chronic kidney disease, or unspecified chronic kidney disease: Secondary | ICD-10-CM | POA: Diagnosis present

## 2018-05-08 LAB — GLUCOSE, CAPILLARY
GLUCOSE-CAPILLARY: 71 mg/dL (ref 65–99)
Glucose-Capillary: 134 mg/dL — ABNORMAL HIGH (ref 65–99)
Glucose-Capillary: 347 mg/dL — ABNORMAL HIGH (ref 65–99)
Glucose-Capillary: 368 mg/dL — ABNORMAL HIGH (ref 65–99)
Glucose-Capillary: 183 mg/dL — ABNORMAL HIGH (ref 65–99)

## 2018-05-08 LAB — AMMONIA: AMMONIA: 21 umol/L (ref 9–35)

## 2018-05-08 LAB — CORTISOL: Cortisol, Plasma: 12.4 ug/dL

## 2018-05-08 LAB — MAGNESIUM: MAGNESIUM: 1.2 mg/dL — AB (ref 1.7–2.4)

## 2018-05-08 LAB — HEMOGLOBIN A1C
HEMOGLOBIN A1C: 5.7 % — AB (ref 4.8–5.6)
MEAN PLASMA GLUCOSE: 116.89 mg/dL

## 2018-05-08 MED ORDER — DOCUSATE SODIUM 100 MG PO CAPS
100.0000 mg | ORAL_CAPSULE | Freq: Every day | ORAL | Status: DC
  Administered 2018-05-08 – 2018-05-13 (×6): 100 mg via ORAL
  Filled 2018-05-08 (×6): qty 1

## 2018-05-08 MED ORDER — POLYETHYLENE GLYCOL 3350 17 G PO PACK
17.0000 g | PACK | Freq: Every day | ORAL | Status: DC | PRN
  Administered 2018-05-09: 17 g via ORAL
  Filled 2018-05-08: qty 1

## 2018-05-08 MED ORDER — INSULIN ASPART 100 UNIT/ML ~~LOC~~ SOLN
0.0000 [IU] | Freq: Three times a day (TID) | SUBCUTANEOUS | Status: DC
  Administered 2018-05-08: 9 [IU] via SUBCUTANEOUS
  Administered 2018-05-08: 1 [IU] via SUBCUTANEOUS

## 2018-05-08 MED ORDER — MAGNESIUM SULFATE 2 GM/50ML IV SOLN
2.0000 g | Freq: Once | INTRAVENOUS | Status: AC
  Administered 2018-05-08: 2 g via INTRAVENOUS
  Filled 2018-05-08: qty 50

## 2018-05-08 MED ORDER — INSULIN ASPART 100 UNIT/ML ~~LOC~~ SOLN
3.0000 [IU] | Freq: Three times a day (TID) | SUBCUTANEOUS | Status: DC
  Administered 2018-05-08: 3 [IU] via SUBCUTANEOUS

## 2018-05-08 MED ORDER — SODIUM CHLORIDE 0.9 % IV SOLN
INTRAVENOUS | Status: DC | PRN
  Administered 2018-05-08 – 2018-05-09 (×4): via INTRAVENOUS

## 2018-05-08 MED ORDER — TIZANIDINE HCL 4 MG PO TABS
2.0000 mg | ORAL_TABLET | Freq: Three times a day (TID) | ORAL | Status: DC | PRN
  Administered 2018-05-09: 2 mg via ORAL
  Filled 2018-05-08: qty 1

## 2018-05-08 MED ORDER — GLUCERNA SHAKE PO LIQD
237.0000 mL | Freq: Three times a day (TID) | ORAL | Status: DC
  Administered 2018-05-08 – 2018-05-13 (×12): 237 mL via ORAL

## 2018-05-08 NOTE — Progress Notes (Addendum)
Progress Note    Amber [name]  MHD:[id number] DOB: 1955-12-09  DOA: 05/06/2018 PCP: [name], MD    Brief Narrative:   Chief complaint: F/U abdominal pain  Medical records reviewed and are as summarized below:  Amber [name] is an 62 y.o. female with a PMH of chronic pancreatitis who was admitted 05/06/2018 with a chief complaint of abdominal pain associated with nausea and vomiting.  Assessment/Plan:   Principal Problem:   Epigastric abdominal pain/nausea & vomiting Known history of chronic pancreatitis.  CT of the abdomen and pelvis from outside facility performed 05/03/2018 and did not show any acute abnormalities.  Lipase/LFTs unremarkable.  Nausea and vomiting improved with supportive care.  Adrenal insufficiency unlikely given cortisol of 12.4. D/C prednisone.  Active Problems:   Acute metabolic encephalopathy Noted to be somnolent this afternoon with associated myoclonic jerking. Appears to be related to medications including Neurontin, Xanaflex.  Medications verified with son and adjusted.  Check ammonia.    Hypomagnesemia Magnesium checked and found to be low. Replete.    Diabetes mellitus type 2, controlled (HCC)/hypoglycemia with chronic kidney disease/renal complications without long-term use of insulin Managed with Glucophage at home.  Had a low glucose of 58 on 05/07/2018, so SSI was discontinued.  Current hemoglobin A1c 5.7%.  Given persistent metabolic acidosis, Glucophage is contraindicated. Was put on prednisone, son says for asthma, but hasn't taken it for 2 weeks.  Blood glucoses subsequently markedly elevated.  Discontinue prednisone.  Cortisol WNL. DM coordinator consultation requested.    Essential hypertension Currently controlled.   History of seizure disorder (HCC) Continue Keppra, Depakote, and lacosamide.   Chronic anemia Stable with no current indication for transfusion.    Moderate malnutrition (HCC) Continue supplementation per dietitian  recommendations. Body mass index is 18.89 kg/m.    PAF (paroxysmal atrial fibrillation) (HCC) Controlled on metoprolol.  Continue apixaban.    AKI/stage III chronic kidney disease Creatinine improved. Noted to have metabolic acidosis.    MRSA carrier Was maintained on contact isolation.  Family Communication/Anticipated D/C date and plan/Code Status   DVT prophylaxis: Eliquis ordered. Code Status: Full Code.  Family Communication: Son updated by telephone and at bedside.  Disposition Plan: Home within 24 hours if diet can be successfully advanced without recurrent symptoms.   Medical Consultants:    None.   Anti-Infectives:    None  Subjective:   Appetite poor, didn't touch breakfast tray. Sleeping when I arrived, slow to wake up and respond. Reports she does not feel any better.  Objective:    Vitals:   05/08/18 1421 05/08/18 2220 05/09/18 0536 05/09/18 0831  BP: 128/82 (!) 141/87 (!) 110/95   Pulse: 78 77 91   Resp: 18 12 16    Temp: (!) 97.4 F (36.3 C) 97.9 F (36.6 C) 98.1 F (36.7 C)   TempSrc: Oral     SpO2: 98% 100% 100% 100%  Weight:      Height:        Intake/Output Summary (Last 24 hours) at 05/09/2018 0839 Last data filed at 05/09/2018 0536 Gross per 24 hour  Intake 601.53 ml  Output 1550 ml  Net -948.47 ml   Filed Weights   05/06/18 1723 05/07/18 0030  Weight: 47.6 kg (105 lb) 43.9 kg (96 lb 11.2 oz)    Exam: General: Mildly somnolent. Cardiovascular: Heart sounds show a regular rate, and rhythm. No gallops or rubs. No murmurs. No JVD. Lungs: Clear to auscultation bilaterally with good air movement. No rales, rhonchi  or wheezes. Abdomen: Soft, nontender, nondistended with normal active bowel sounds. No masses. No hepatosplenomegaly. Neurological: Sleepy, slow to respond. Cranial nerves II through XII grossly intact. Skin: Warm and dry. No rashes or lesions. Extremities: No clubbing or cyanosis. No edema. Pedal pulses 2+. Psychiatric:  Mood and affect are flat. Insight and judgment are fair.   Data Reviewed:   I have personally reviewed following labs and imaging studies:  Labs: Labs show the following:   Basic Metabolic Panel: Recent Labs  Lab 05/06/18 1741 05/07/18 0335 05/08/18 1614 05/09/18 0239  NA 141 138  --  144  K 4.5 4.5  --  3.7  CL 114* 114*  --  119*  CO2 21* 18*  --  16*  GLUCOSE 58* 157*  --  97  BUN 13 11  --  10  CREATININE 1.09* 0.97  --  0.90  CALCIUM 8.4* 7.8*  --  8.0*  MG  --   --  1.2*  --    GFR Estimated Creatinine Clearance: 46.6 mL/min (by C-G formula based on SCr of 0.9 mg/dL). Liver Function Tests: Recent Labs  Lab 05/06/18 1741 05/07/18 0335  AST 31 35  ALT 21 20  ALKPHOS 131* 117  BILITOT 0.5 0.5  PROT 5.3* 4.6*  ALBUMIN 2.1* 1.8*   Recent Labs  Lab 05/06/18 1741  LIPASE 22   CBC: Recent Labs  Lab 05/06/18 1741 05/07/18 0335  WBC 8.1 5.7  HGB 8.7* 8.1*  HCT 27.0* 25.6*  MCV 78.3 79.0  PLT 164 175   CBG: Recent Labs  Lab 05/08/18 0813 05/08/18 1221 05/08/18 1535 05/08/18 1726 05/08/18 2249  GLUCAP 71 134* 347* 368* 183*    Microbiology Recent Results (from the past 240 hour(s))  MRSA PCR Screening     Status: Abnormal   Collection Time: 05/07/18  3:44 AM  Result Value Ref Range Status   MRSA by PCR POSITIVE (A) NEGATIVE Final    Comment:        The GeneXpert MRSA Assay (FDA approved for NASAL specimens only), is one component of a comprehensive MRSA colonization surveillance program. It is not intended to diagnose MRSA infection nor to guide or monitor treatment for MRSA infections. RESULT CALLED TO, READ BACK BY AND VERIFIED WITH: Maximino Greenland RN 05/07/18 7412 JDW Performed at [facility], [address] [place], [place] [address]     Procedures and diagnostic studies:  No results found.  Medications:   . allopurinol  300 mg Oral Daily  . apixaban  5 mg Oral BID  . aspirin  325 mg Oral Daily  . atorvastatin  10 mg  Oral Daily  . Chlorhexidine Gluconate Cloth  6 each Topical Q0600  . docusate sodium  100 mg Oral Daily  . feeding supplement (GLUCERNA SHAKE)  237 mL Oral TID BM  . ferrous sulfate  325 mg Oral Q breakfast  . folic acid  1 mg Oral Daily  . insulin aspart  0-9 Units Subcutaneous TID WC  . insulin aspart  3 Units Subcutaneous TID WC  . lacosamide  100 mg Oral BID  . levETIRAcetam  500 mg Oral BID  . lidocaine  1 patch Transdermal Q24H  . metoprolol succinate  25 mg Oral Daily  . mometasone-formoterol  2 puff Inhalation BID  . multivitamin with minerals  1 tablet Oral Daily  . mupirocin ointment  1 application Nasal BID  . pantoprazole  80 mg Oral Daily  . potassium chloride  20 mEq Oral BID  . valproic acid  500 mg Oral Q12H   Continuous Infusions: . sodium chloride Stopped (05/08/18 2358)  . famotidine (PEPCID) IV 20 mg (05/08/18 2309)     LOS: 1 day    Time based visit.  35 minutes spent in the care of this patient with greater than 50% of the time spent in face-to-face interaction and reassessment of the patient after she had a mental status change noted.  Trula Ore Isabel Freese  [facility] Pager [phone]. If unable to reach me by pager, please call my cell phone at [phone].  *Please refer to [email or web address], password [email or web address] to get updated schedule on who will round on this patient, as hospitalists switch teams weekly. If 7PM-7AM, please contact night-coverage at [email or web address], password [email or web address] for any overnight needs.  05/08/2018

## 2018-05-08 NOTE — Progress Notes (Signed)
X cover note: Came to evaluate patient for "jerking/shaking" movements.  Son also reports patient's speech is different- -also more confused and sleepy.  Upon my exam, patient awake and able to answer questions-- there is some confusion around her medications.  Reports she has been removed from Neurontin and a "seizure" med-- has been given 2 doses here.  Takes 1/2 xanaflex and only once/day... Has gotten 4 mg each day here.    Have asked pharmacy to investigate what she is actually taking and asked son to bring in list from home.  Have also asked for a copy of medication list from her PCP to verify.  Bowel regimen ordered along with ammonia and Mg. Will continue to monitor.  Marlin Canary DO

## 2018-05-08 NOTE — Progress Notes (Signed)
   05/08/18 1200  Clinical Encounter Type  Visited With Patient  Visit Type Initial  Referral From Nurse  Consult/Referral To Chaplain  Spiritual Encounters  Spiritual Needs Literature   Responded to a SCC for information on HCPA.  Patient was alone and when I asked if now was a good time to discuss she asked that a chaplain come back later when family is present.  Will pass on to another chaplain to follow up. Chaplain Agustin Cree

## 2018-05-08 NOTE — Evaluation (Signed)
Physical Therapy Evaluation Patient Details Name: Amber [name] MRN: [id number] DOB: 01-Oct-1956 Today's Date: 05/08/2018   History of Present Illness  Pt is a 62 y/o female admitted secondary to nausea/vomiting. Abdomen negative for acute abnormalities. PMH includes DM, HTN, a fib, chronic pancreatitis, CVA, seizures, and back surgery.   Clinical Impression  Pt admitted secondary to problem above with deficits below. Pt sleepy and tearful throughout mobility this session and only able to tolerate basic bed mobility. Required mod to max A for bed mobility. Pt also presenting with jerking movements in bilat UE > bilat LEs, which pt's son reports as new. Pt's son also reports speech is "different" however could not clarify the difference from baseline. Feel pt is at an increased risk for falls and feel pt would benefit from SNF at d/c to increase safety with mobility. Will continue to follow acutely to maximize functional mobility independence and safety.     Follow Up Recommendations SNF;Supervision/Assistance - 24 hour(will likely refuse; will need HHPT if refuses )    Equipment Recommendations  None recommended by PT    Recommendations for Other Services OT consult     Precautions / Restrictions Precautions Precautions: Fall Restrictions Weight Bearing Restrictions: No      Mobility  Bed Mobility Overal bed mobility: Needs Assistance Bed Mobility: Supine to Sit;Sit to Supine     Supine to sit: Mod assist;Max assist Sit to supine: Mod assist;Max assist   General bed mobility comments: Mod to max A for basic bed mobility. Pt very tearful during transition to sitting and back to supine, so further mobility deferred. Pt's son reports increased difficulty with movement.   Transfers                    Ambulation/Gait                Stairs            Wheelchair Mobility    Modified Rankin (Stroke Patients Only)       Balance Overall balance  assessment: Needs assistance Sitting-balance support: No upper extremity supported;Feet supported Sitting balance-Leahy Scale: Poor Sitting balance - Comments: Reliant on min to min guard for sitting balance                                      Pertinent Vitals/Pain Pain Assessment: Faces Faces Pain Scale: Hurts little more Pain Location: stomach  Pain Descriptors / Indicators: Sore Pain Intervention(s): Limited activity within patient's tolerance;Monitored during session;Repositioned    Home Living Family/patient expects to be discharged to:: Private residence Living Arrangements: Other relatives(grandaughter ) Available Help at Discharge: Family;Available PRN/intermittently;Personal care attendant(PCA 3 hours, 7 days/week ) Type of Home: Apartment Home Access: Stairs to enter Entrance Stairs-Rails: None Entrance Stairs-Number of Steps: 5 Home Layout: One level Home Equipment: Tub bench;Walker - 4 wheels;Bedside commode      Prior Function Level of Independence: Needs assistance   Gait / Transfers Assistance Needed: Pt's son reports pt ambulates with rollator mod I   ADL's / Homemaking Assistance Needed: Reports aide assists with ADL tasks 7 days/week, 3 hours/day        Hand Dominance   Dominant Hand: Right    Extremity/Trunk Assessment   Upper Extremity Assessment Upper Extremity Assessment: RUE deficits/detail;LUE deficits/detail RUE Deficits / Details: Generalized weakness noted throughout. Jerking type movements with BUE during finger to nose test. Son reports  this is new for pt.  RUE Coordination: decreased gross motor;decreased fine motor LUE Deficits / Details: Generalized weakness noted throughout. Jerking type movements with BUE during finger to nose test. Son reports this is new for pt.  LUE Coordination: decreased gross motor;decreased fine motor    Lower Extremity Assessment Lower Extremity Assessment: RLE deficits/detail;LLE  deficits/detail;Generalized weakness RLE Deficits / Details: Gross weakness throughout. Slight jerking type movements noted during LE movements.  RLE Sensation: decreased light touch(in feet ) RLE Coordination: decreased gross motor LLE Deficits / Details: Gross weakness throughout. Slight jerking type movements noted during LE movements.  LLE Sensation: decreased light touch(in feet ) LLE Coordination: decreased gross motor;decreased fine motor    Cervical / Trunk Assessment Cervical / Trunk Assessment: Kyphotic  Communication   Communication: Other (comment)(slurred speech; pt's son reports that is new )  Cognition Arousal/Alertness: Lethargic Behavior During Therapy: Restless(tearful ) Overall Cognitive Status: Difficult to assess                                 General Comments: Pt falling asleep during session and very tearful at times during mobility. Pt's son reports he has not noted any new confusion.       General Comments General comments (skin integrity, edema, etc.): Pt's son present during session.     Exercises     Assessment/Plan    PT Assessment Patient needs continued PT services  PT Problem List Decreased strength;Decreased balance;Decreased activity tolerance;Decreased mobility;Decreased cognition;Decreased coordination;Decreased knowledge of use of DME;Decreased knowledge of precautions;Decreased safety awareness;Impaired sensation       PT Treatment Interventions DME instruction;Stair training;Gait training;Functional mobility training;Therapeutic exercise;Balance training;Therapeutic activities;Patient/family education    PT Goals (Current goals can be found in the Care Plan section)  Acute Rehab PT Goals Patient Stated Goal: to feel better  PT Goal Formulation: With patient Time For Goal Achievement: 05/22/18 Potential to Achieve Goals: Fair    Frequency Min 3X/week   Barriers to discharge        Co-evaluation                AM-PAC PT "6 Clicks" Daily Activity  Outcome Measure Difficulty turning over in bed (including adjusting bedclothes, sheets and blankets)?: A Lot Difficulty moving from lying on back to sitting on the side of the bed? : Unable Difficulty sitting down on and standing up from a chair with arms (e.g., wheelchair, bedside commode, etc,.)?: Unable Help needed moving to and from a bed to chair (including a wheelchair)?: A Lot Help needed walking in hospital room?: A Lot Help needed climbing 3-5 steps with a railing? : Total 6 Click Score: 9    End of Session   Activity Tolerance: Patient limited by lethargy Patient left: in bed;with call bell/phone within reach;with bed alarm set;with family/visitor present Nurse Communication: Mobility status;Other (comment)(UE/LE jerking movements ) PT Visit Diagnosis: Difficulty in walking, not elsewhere classified (R26.2);Muscle weakness (generalized) (M62.81);Unsteadiness on feet (R26.81)    Time: 6468-0321 PT Time Calculation (min) (ACUTE ONLY): 18 min   Charges:   PT Evaluation $PT Eval Moderate Complexity: 1 Mod     PT G Codes:        Gladys Damme, PT, DPT  Acute Rehabilitation Services  Pager: [phone]   Lehman Prom 05/08/2018, 3:31 PM

## 2018-05-08 NOTE — Progress Notes (Signed)
Inpatient Diabetes Program Recommendations  AACE/ADA: New Consensus Statement on Inpatient Glycemic Control (2019)  Target Ranges:  Prepandial:   less than 140 mg/dL      Peak postprandial:   less than 180 mg/dL (1-2 hours)      Critically ill patients:  140 - 180 mg/dL  Results for DHRITI, FALES (MRN [id number]) as of 05/08/2018 12:01  Ref. Range 05/07/2018 00:58 05/07/2018 01:25 05/07/2018 03:30 05/07/2018 06:03 05/07/2018 08:09 05/07/2018 12:26 05/07/2018 17:06 05/07/2018 21:10 05/08/2018 08:13  Glucose-Capillary Latest Ref Range: 65 - 99 mg/dL 56 (L) 73 315 (H) 400 (H) 174 (H) 216 (H) 342 (H) 368 (H) 71  Results for JON, KASPAREK (MRN [id number]) as of 05/08/2018 12:01  Ref. Range 05/07/2018 03:35  Hemoglobin Latest Ref Range: 12.0 - 15.0 g/dL 8.1 (L)   Results for DAYA, DUTT (MRN [id number]) as of 05/08/2018 12:01  Ref. Range 10/27/2016 05:32  Hemoglobin A1C Latest Ref Range: 4.8 - 5.6 % 4.7 (L)   Review of Glycemic Control  Diabetes history: DM2 Outpatient Diabetes medications: Metformin XR 500 mg daily Current orders for Inpatient glycemic control: Novolog 0-9 units TID with meals  Inpatient Diabetes Program Recommendations:  Oral Agents: MD notes to discontinue Metformin XR 500 mg as an outpatient due to acidosis. Patient was noted to have hypoglycemia on 05/07/18 and fasting glucose 71 mg/dl this morning. Would not recommend prescribing any DM medication as an outpatient and have patient monitor glucose at home and follow up with PCP regarding glycemic control. HgbA1C: Current A1C in process. Hemoglobin low (8.1 g/dL on 0/99/83); therefore, A1C results will not be completely accurate.  NOTE: In reviewing chart, noted glucose of 56 mg/dl on 3/82/50 @ 53:97 and noted to be 71 mg/dl today @ 6:73 am. Also noted patient received Cortef 50 mg x 2 on 05/07/18 which contributed to hyperglycemia noted yesterday afternoon and evening (CBG up to 268 mg/dl at bedtime last night). Patient received  Prednisone 15 mg PO today but no further steroids ordered at this time.  Thanks, Orlando Penner, RN, MSN, CDE Diabetes Coordinator Inpatient Diabetes Program [phone] (Team Pager from 8am to 5pm)

## 2018-05-09 ENCOUNTER — Encounter (HOSPITAL_COMMUNITY): Payer: Self-pay | Admitting: Internal Medicine

## 2018-05-09 DIAGNOSIS — E872 Acidosis, unspecified: Secondary | ICD-10-CM | POA: Diagnosis present

## 2018-05-09 DIAGNOSIS — K7200 Acute and subacute hepatic failure without coma: Secondary | ICD-10-CM | POA: Diagnosis not present

## 2018-05-09 DIAGNOSIS — K7682 Hepatic encephalopathy: Secondary | ICD-10-CM | POA: Diagnosis not present

## 2018-05-09 DIAGNOSIS — J4530 Mild persistent asthma, uncomplicated: Secondary | ICD-10-CM | POA: Diagnosis present

## 2018-05-09 DIAGNOSIS — G9341 Metabolic encephalopathy: Secondary | ICD-10-CM | POA: Diagnosis not present

## 2018-05-09 LAB — BASIC METABOLIC PANEL
Anion gap: 9 (ref 5–15)
BUN: 10 mg/dL (ref 6–20)
CHLORIDE: 119 mmol/L — AB (ref 101–111)
CO2: 16 mmol/L — AB (ref 22–32)
Calcium: 8 mg/dL — ABNORMAL LOW (ref 8.9–10.3)
Creatinine, Ser: 0.9 mg/dL (ref 0.44–1.00)
GFR calc non Af Amer: >60 mL/min (ref >60)
GLUCOSE: 97 mg/dL (ref 65–99)
POTASSIUM: 3.7 mmol/L (ref 3.5–5.1)
Sodium: 144 mmol/L (ref 135–145)

## 2018-05-09 LAB — GLUCOSE, CAPILLARY
GLUCOSE-CAPILLARY: 81 mg/dL (ref 65–99)
GLUCOSE-CAPILLARY: 74 mg/dL (ref 65–99)
GLUCOSE-CAPILLARY: 117 mg/dL — AB (ref 65–99)
Glucose-Capillary: 84 mg/dL (ref 65–99)
Glucose-Capillary: 86 mg/dL (ref 65–99)

## 2018-05-09 MED ORDER — OXYCODONE-ACETAMINOPHEN 5-325 MG PO TABS: 1.0000 | ORAL_TABLET | Freq: Three times a day (TID) | ORAL | Status: DC | PRN

## 2018-05-09 MED ORDER — OXYCODONE-ACETAMINOPHEN 5-325 MG PO TABS
0.5000 | ORAL_TABLET | Freq: Three times a day (TID) | ORAL | Status: DC | PRN
  Administered 2018-05-09 – 2018-05-10 (×3): 0.5 via ORAL
  Filled 2018-05-09 (×3): qty 1

## 2018-05-09 MED ORDER — OXYCODONE HCL 5 MG PO TABS
5.0000 mg | ORAL_TABLET | Freq: Once | ORAL | Status: AC
  Administered 2018-05-09: 5 mg via ORAL
  Filled 2018-05-09: qty 1

## 2018-05-09 NOTE — Progress Notes (Signed)
Occupational Therapy Evaluation Patient Details Name: Amber [name] MRN: [id number] DOB: May 07, 1956 Today's Date: 05/09/2018    History of Present Illness Pt is a 62 y/o female admitted secondary to nausea/vomiting. Abdomen negative for acute abnormalities. PMH includes DM, HTN, a fib, chronic pancreatitis, CVA, seizures, and back surgery.    Clinical Impression   PTA, pt was living at home with son and required assistance with ADLs and IADLs from family and PCA who came 7days a week for 3hours. Pt reports, PTA she was ambulating to the toilet at RW level. Pt is a poor historian and is unable to provide amount of assistance provided and duration of supervision. Pt currently requires modA +2 for physical assistance for functional mobility at RW level during ADLs and modA for ADLs. Due to deficits listed below (see OT problem list), pt would benefit from acute OT to address establish goals to facilitate safe D/C home. Due to pt's current level of function, pt requires 24/7 supervision. Feel pt does not have the amount of support, reported by pt's son in PT eval, required for safe return home, recommend SNF follow-up.      Follow Up Recommendations  SNF;Supervision/Assistance - 24 hour    Equipment Recommendations       Recommendations for Other Services       Precautions / Restrictions Precautions Precautions: Fall Restrictions Weight Bearing Restrictions: No      Mobility Bed Mobility Overal bed mobility: Needs Assistance Bed Mobility: Supine to Sit;Sit to Supine     Supine to sit: Mod assist;HOB elevated Sit to supine: Mod assist   General bed mobility comments: HOB fully elevated;VC for progression to EOB;pt tearful sitting EOB  Transfers Overall transfer level: Needs assistance Equipment used: Rolling walker (2 wheeled) Transfers: Sit to/from [employer] Sit to Stand: Mod assist;+2 physical assistance Stand pivot transfers: Mod assist;+2 physical  assistance       General transfer comment: pt able to walk in straight line with modA +2, required maxA+2 to turn walker for pivot and VC for sequencing and safe hand placement    Balance Overall balance assessment: Needs assistance Sitting-balance support: No upper extremity supported;Feet supported Sitting balance-Leahy Scale: Poor   Postural control: Posterior lean Standing balance support: Bilateral upper extremity supported;During functional activity Standing balance-Leahy Scale: Poor                             ADL either performed or assessed with clinical judgement   ADL Overall ADL's : Needs assistance/impaired Eating/Feeding: Minimal assistance;Sitting Eating/Feeding Details (indicate cue type and reason): upon arrival NT feeding pt her lunch, NT reported pt had been eating for about 1hour;pt able to feed self dessert with set up assist Grooming: Moderate assistance;Sitting   Upper Body Bathing: Moderate assistance;Sitting   Lower Body Bathing: Moderate assistance;+2 for physical assistance;Sit to/from stand Lower Body Bathing Details (indicate cue type and reason): pt able to reach knees Upper Body Dressing : Moderate assistance;Sitting   Lower Body Dressing: Maximal assistance;+2 for physical assistance;Sit to/from stand Lower Body Dressing Details (indicate cue type and reason): pt unable to don socks;able to reach knees Toilet Transfer: Moderate assistance;+2 for physical assistance;Stand-pivot;RW Toilet Transfer Details (indicate cue type and reason): simulated stand-pivot from EOB to recliner Toileting- Clothing Manipulation and Hygiene: Moderate assistance;+2 for physical assistance;Sit to/from stand       Functional mobility during ADLs: Moderate assistance;+2 for physical assistance;Rolling walker General ADL Comments: pt is a  poor historian;reports she was ambulating to the bathroom with her RW     Vision         Perception     Praxis       Pertinent Vitals/Pain Pain Assessment: 0-10 Pain Score: 7  Faces Pain Scale: Hurts worst Pain Location: pt reports pain level 7 in stomach;faces 10 pain level in back and feet, exacerbated sitting EOB, noted transdermal patch on upper back.  Pain Descriptors / Indicators: Sore;Moaning;Crying;Discomfort Pain Intervention(s): Limited activity within patient's tolerance;Monitored during session     Hand Dominance Right   Extremity/Trunk Assessment Upper Extremity Assessment Upper Extremity Assessment: Generalized weakness;RUE deficits/detail;LUE deficits/detail RUE Deficits / Details: uncontrolled jerky movments with voluntary movement of UE;unable to reach RW handle when sitting EOB;generalized weakness noted with decreased strength to push up from sitting and limited ability to use LUE to stabilize standing with RW;AAROM to wipe tears from eyes  RUE Coordination: decreased fine motor;decreased gross motor LUE Deficits / Details: uncontrolled jerky movments with voluntary movement of UE;crepitus noted in shoulder;unable to reach RW handle when sitting EOB;generalized weakness noted with decreased strength to push up from sitting and limited ability to use LUE to stabilize standing with RW LUE Coordination: decreased gross motor;decreased fine motor   Lower Extremity Assessment Lower Extremity Assessment: Defer to PT evaluation   Cervical / Trunk Assessment Cervical / Trunk Assessment: Kyphotic   Communication Communication Communication: Other (comment)(perseveration  )   Cognition Arousal/Alertness: Lethargic Behavior During Therapy: Restless(tearful ) Overall Cognitive Status: Difficult to assess(no family/caregiver present to determine baseline cognitive )                                 General Comments: Pt falling asleep during session and very tearful at times during mobility.     General Comments       Exercises     Shoulder Instructions      Home  Living Family/patient expects to be discharged to:: Private residence Living Arrangements: Other relatives(grandaughter ) Available Help at Discharge: Family;Available PRN/intermittently;Personal care attendant(PCA 3 hours, 7 days/week ) Type of Home: Apartment Home Access: Stairs to enter Entrance Stairs-Number of Steps: 5 Entrance Stairs-Rails: None Home Layout: One level     Bathroom Shower/Tub: IT trainer: Standard     Home Equipment: Tub bench;Walker - 4 wheels;Bedside commode   Additional Comments: Pt reports she lives with son, who assists her with ADLs        Prior Functioning/Environment Level of Independence: Needs assistance  Gait / Transfers Assistance Needed: pt reports she ambulates with rollator ADL's / Homemaking Assistance Needed: Per PT report, personal care assistant assists with ADL tasks 7 days/week, 3 hours/day   Comments: pt reports mobility this session is baseline        OT Problem List: Decreased strength;Decreased range of motion;Decreased activity tolerance;Impaired balance (sitting and/or standing);Decreased coordination;Decreased cognition;Decreased safety awareness;Decreased knowledge of use of DME or AE;Impaired UE functional use;Pain      OT Treatment/Interventions: Self-care/ADL training;Therapeutic exercise;Energy conservation;DME and/or AE instruction;Therapeutic activities;Cognitive remediation/compensation;Patient/family education;Balance training    OT Goals(Current goals can be found in the care plan section) Acute Rehab OT Goals Patient Stated Goal: to feel better  OT Goal Formulation: With patient Time For Goal Achievement: 05/23/18 Potential to Achieve Goals: Fair ADL Goals Pt Will Perform Eating: with supervision(for at least 50% of her meal.) Pt Will Perform Upper Body Dressing: with min assist  Pt Will Transfer to Toilet: with mod assist;stand pivot transfer  OT Frequency: Min 2X/week   Barriers  to D/C: Decreased caregiver support  per pt report, son works;pt unable to identify care available when son is not present       Co-evaluation              AM-PAC PT "6 Clicks" Daily Activity     Outcome Measure Help from another person eating meals?: A Lot Help from another person taking care of personal grooming?: A Little Help from another person toileting, which includes using toliet, bedpan, or urinal?: A Lot Help from another person bathing (including washing, rinsing, drying)?: A Lot Help from another person to put on and taking off regular upper body clothing?: A Little Help from another person to put on and taking off regular lower body clothing?: A Lot 6 Click Score: 14   End of Session Equipment Utilized During Treatment: Gait belt;Rolling walker Nurse Communication: Mobility status  Activity Tolerance: Patient limited by pain Patient left: in chair;with call bell/phone within reach;with chair alarm set  OT Visit Diagnosis: Unsteadiness on feet (R26.81);Other abnormalities of gait and mobility (R26.89);Muscle weakness (generalized) (M62.81);Feeding difficulties (R63.3);Pain Pain - part of body: (back, BLE, bilateral knees)                Time: (P) 1424-(P) 1500 OT Time Calculation (min): (P) 36 min Charges:  OT General Charges $OT Visit: (P) 1 Visit OT Evaluation $OT Eval Moderate Complexity: (P) 1 Mod OT Treatments $Self Care/Home Management : (P) 8-22 mins G-Codes:     Diona Browner OTS    Diona Browner 05/09/2018, 4:59 PM

## 2018-05-09 NOTE — Progress Notes (Addendum)
Progress Note    Amber [name]  VFM:[id number] DOB: December 13, 1955  DOA: 05/06/2018 PCP: [name], MD    Brief Narrative:   Chief complaint: F/U abdominal pain  Medical records reviewed and are as summarized below:  Amber [name] is an 62 y.o. female with a PMH of chronic pancreatitis who was admitted 05/06/2018 with a chief complaint of abdominal pain associated with nausea and vomiting.  Assessment/Plan:   Principal Problem:   Epigastric abdominal pain/nausea & vomiting Known history of chronic pancreatitis.  CT of the abdomen and pelvis from outside facility performed 05/03/2018 and did not show any acute abnormalities.  Lipase/LFTs unremarkable.  Nausea and vomiting improved with supportive care.  Adrenal insufficiency unlikely given cortisol of 12.4. All steroids discontinued.  Active Problems:   Acute metabolic encephalopathy Noted to be somnolent 05/08/2018 with associated myoclonic jerking. Appears to be related to medications including Neurontin, Xanaflex.  Medications verified with son and adjusted.  Decrease oxycodone dose and stop Zanaflex. Ammonia WNL at 21.    Hypomagnesemia Given 2 g of IV magnesium replacement 05/08/2018.    Diabetes mellitus type 2, controlled (HCC)/hypoglycemia with chronic kidney disease/renal complications without long-term use of insulin Managed with Glucophage at home.  Had a low glucose of 58 on 05/07/2018, so SSI was discontinued.  Current hemoglobin A1c 5.7%.  Given persistent metabolic acidosis, Glucophage is contraindicated. Was put on prednisone, son says for asthma, but hasn't taken it for 2 weeks.  Blood glucoses subsequently markedly elevated.  Prednisone discontinued 05/08/2018.  Cortisol WNL. Diabetes coordinator recommends discharge home off hypoglycemics.  Blood glucose 97 on morning chemistries.  Will discontinue SSI and continue CBG checks for now.    Essential hypertension Currently controlled.   History of seizure disorder  (HCC) Continue Keppra, Depakote, and lacosamide. Pharmacy has been asked to verify doses again, since admission med-rec had errors.    Chronic anemia Stable with no current indication for transfusion.    Moderate malnutrition (HCC) Continue supplementation per dietitian recommendations. Body mass index is 18.89 kg/m.    PAF (paroxysmal atrial fibrillation) (HCC) Controlled on metoprolol.  Continue apixaban.    AKI/stage III chronic kidney disease/metabolic acidosis Creatinine improved. Still has metabolic acidosis.    MRSA carrier Was maintained on contact isolation.    Mild persistent asthma in a current non-smoker No wheezing, currently controlled.  Continue Dulera and as needed bronchodilators.  Currently smoking.  Family Communication/Anticipated D/C date and plan/Code Status   DVT prophylaxis: Eliquis ordered. Code Status: Full Code.  Family Communication: Son updated by telephone and at bedside 05/08/18.  Disposition Plan: Home within 24 hours if diet can be successfully advanced without recurrent symptoms.   Medical Consultants:    None.   Anti-Infectives:    None  Subjective:   The patient still has mental status changes, is slower to respond than normal but more alert than yesterday.  She continues to have muscle weakness and myoclonic jerking.  Appetite still poor.  Objective:    Vitals:   05/08/18 1421 05/08/18 2220 05/09/18 0536 05/09/18 0831  BP: 128/82 (!) 141/87 (!) 110/95   Pulse: 78 77 91   Resp: 18 12 16    Temp: (!) 97.4 F (36.3 C) 97.9 F (36.6 C) 98.1 F (36.7 C)   TempSrc: Oral     SpO2: 98% 100% 100% 100%  Weight:      Height:        Intake/Output Summary (Last 24 hours) at 05/09/2018 1027 Last data filed  at 05/09/2018 0536 Gross per 24 hour  Intake 361.53 ml  Output 1550 ml  Net -1188.47 ml   Filed Weights   05/06/18 1723 05/07/18 0030  Weight: 47.6 kg (105 lb) 43.9 kg (96 lb 11.2 oz)    Exam: General: No acute  distress. Cardiovascular: Heart sounds show a regular rate, and rhythm. No gallops or rubs. No murmurs. No JVD. Lungs: Clear to auscultation bilaterally with good air movement. No rales, rhonchi or wheezes. Abdomen: Soft, nontender, nondistended with normal active bowel sounds. No masses. No hepatosplenomegaly. Neurological: More alert than 05/08/18, and oriented 2. Moves all extremities 4 with equal but diminished strength. Cranial nerves II through XII grossly intact. +mild myoclonic jerking. Skin: Warm and dry. No rashes or lesions. Extremities: No clubbing or cyanosis. No edema. Pedal pulses 2+. Psychiatric: Mood and affect are flat. Insight and judgment are fair.  Data Reviewed:   I have personally reviewed following labs and imaging studies:  Labs: Labs show the following:   Basic Metabolic Panel: Recent Labs  Lab 05/06/18 1741 05/07/18 0335 05/08/18 1614 05/09/18 0239  NA 141 138  --  144  K 4.5 4.5  --  3.7  CL 114* 114*  --  119*  CO2 21* 18*  --  16*  GLUCOSE 58* 157*  --  97  BUN 13 11  --  10  CREATININE 1.09* 0.97  --  0.90  CALCIUM 8.4* 7.8*  --  8.0*  MG  --   --  1.2*  --    GFR Estimated Creatinine Clearance: 46.6 mL/min (by C-G formula based on SCr of 0.9 mg/dL). Liver Function Tests: Recent Labs  Lab 05/06/18 1741 05/07/18 0335  AST 31 35  ALT 21 20  ALKPHOS 131* 117  BILITOT 0.5 0.5  PROT 5.3* 4.6*  ALBUMIN 2.1* 1.8*   Recent Labs  Lab 05/06/18 1741  LIPASE 22   CBC: Recent Labs  Lab 05/06/18 1741 05/07/18 0335  WBC 8.1 5.7  HGB 8.7* 8.1*  HCT 27.0* 25.6*  MCV 78.3 79.0  PLT 164 175   CBG: Recent Labs  Lab 05/08/18 1221 05/08/18 1535 05/08/18 1726 05/08/18 2249 05/09/18 0851  GLUCAP 134* 347* 368* 183* 84    Microbiology Recent Results (from the past 240 hour(s))  MRSA PCR Screening     Status: Abnormal   Collection Time: 05/07/18  3:44 AM  Result Value Ref Range Status   MRSA by PCR POSITIVE (A) NEGATIVE Final     Comment:        The GeneXpert MRSA Assay (FDA approved for NASAL specimens only), is one component of a comprehensive MRSA colonization surveillance program. It is not intended to diagnose MRSA infection nor to guide or monitor treatment for MRSA infections. RESULT CALLED TO, READ BACK BY AND VERIFIED WITH: Maximino Greenland RN 05/07/18 6160 JDW Performed at [facility], [address] [place], [place] [address]     Procedures and diagnostic studies:  No results found.  Medications:   . allopurinol  300 mg Oral Daily  . apixaban  5 mg Oral BID  . aspirin  325 mg Oral Daily  . atorvastatin  10 mg Oral Daily  . Chlorhexidine Gluconate Cloth  6 each Topical Q0600  . docusate sodium  100 mg Oral Daily  . feeding supplement (GLUCERNA SHAKE)  237 mL Oral TID BM  . ferrous sulfate  325 mg Oral Q breakfast  . folic acid  1 mg Oral Daily  .  lacosamide  100 mg Oral BID  . levETIRAcetam  500 mg Oral BID  . lidocaine  1 patch Transdermal Q24H  . metoprolol succinate  25 mg Oral Daily  . mometasone-formoterol  2 puff Inhalation BID  . multivitamin with minerals  1 tablet Oral Daily  . mupirocin ointment  1 application Nasal BID  . pantoprazole  80 mg Oral Daily  . potassium chloride  20 mEq Oral BID  . valproic acid  500 mg Oral Q12H   Continuous Infusions: . sodium chloride Stopped (05/08/18 2358)  . famotidine (PEPCID) IV 20 mg (05/09/18 0945)     LOS: 1 day    Time basis visit: 35 minutes with greater than 50% of time spent in face-to-face interaction with the patient and updating son by telephone.  Trula Ore Robben Jagiello  [facility] Pager [phone]. If unable to reach me by pager, please call my cell phone at [phone].  *Please refer to [email or web address], password [email or web address] to get updated schedule on who will round on this patient, as hospitalists switch teams weekly. If 7PM-7AM, please contact night-coverage at [email or web address], password [email or web address] for any overnight  needs.  05/09/2018, 10:27 AM

## 2018-05-09 NOTE — Progress Notes (Addendum)
Physical Therapy Treatment Patient Details Name: Amber [name] MRN: [id number] DOB: 1956-11-02 Today's Date: 05/09/2018    History of Present Illness Pt is a 62 y/o female admitted secondary to nausea/vomiting. Abdomen negative for acute abnormalities. PMH includes DM, HTN, a fib, chronic pancreatitis, CVA, seizures, and back surgery.     PT Comments    Patient is not progressing towards her goals. She is requiring two person maximal assistance to transition from recliner to bedside commode and then to bed using walker. Continues with significant jerkiness/shakiness of BUE and BLE's, causing knee buckling as well as posterior lean and requires significant assist to maintain upright. Presents as a very high fall risk based on balance deficits and cognitive impairments and continue to recommend SNF to patient from falling at home.   Follow Up Recommendations  SNF;Supervision/Assistance - 24 hour((if refusing SNF - will need HHPT))     Equipment Recommendations  None recommended by PT    Recommendations for Other Services       Precautions / Restrictions Precautions Precautions: Fall Restrictions Weight Bearing Restrictions: No    Mobility  Bed Mobility Overal bed mobility: Needs Assistance Bed Mobility: Sit to Supine     Supine to sit: +2 for physical assistance;Mod assist Sit to supine: Mod assist   General bed mobility comments: mod assist for BLE elevation back onto bed.   Transfers Overall transfer level: Needs assistance Equipment used: Rolling walker (2 wheeled) Transfers: Sit to/from Stand Sit to Stand: +2 physical assistance;Max assist Stand pivot transfers: Max assist;+2 physical assistance       General transfer comment: Maximal verbal cueing and hand over hand guidance on safe hand placement on RW. Patient with increased BUE/BLE shakiness with bilateral knee buckle requiring maximal assistance to maintain upright  positioning.  Ambulation/Gait Ambulation/Gait assistance: Max assist;+2 physical assistance   Assistive device: Rolling walker (2 wheeled) Gait Pattern/deviations: Decreased stride length;Narrow base of support;Shuffle;Ataxic     General Gait Details: pivotal steps from chair to [facility] and then [facility] to bed. requiring maxA + 2 to maintain upright positioning and physically assisting with RW on turns and maintaining a straight path.    Stairs             Wheelchair Mobility    Modified Rankin (Stroke Patients Only)       Balance Overall balance assessment: Needs assistance Sitting-balance support: No upper extremity supported;Feet supported Sitting balance-Leahy Scale: Poor Sitting balance - Comments: needs assistance to maintain upright on BSC Postural control: Posterior lean Standing balance support: Bilateral upper extremity supported;During functional activity Standing balance-Leahy Scale: Poor                              Cognition Arousal/Alertness: Lethargic Behavior During Therapy: Restless(tearful ) Overall Cognitive Status: Difficult to assess(no family/caregiver present to determine baseline cognitive )                                 General Comments: Pt very tearful during mobility. Perseverating on statement, "I'm going to wipe my bottom."      Exercises      General Comments        Pertinent Vitals/Pain Pain Assessment: 0-10 Pain Score: 7  Faces Pain Scale: Hurts worst Pain Location: pt reports pain level 7 in stomach;faces 10 pain level in back and feet, exacerbated sitting EOB, noted transdermal patch on upper  back.  Pain Descriptors / Indicators: Sore;Moaning;Crying;Discomfort Pain Intervention(s): Limited activity within patient's tolerance;Monitored during session;Premedicated before session    Home Living Family/patient expects to be discharged to:: Private residence Living Arrangements: Other  relatives(grandaughter ) Available Help at Discharge: Family;Available PRN/intermittently;Personal care attendant(PCA 3 hours, 7 days/week ) Type of Home: Apartment Home Access: Stairs to enter Entrance Stairs-Rails: None Home Layout: One level Home Equipment: Tub bench;Walker - 4 wheels;Bedside commode Additional Comments: Pt reports she lives with son, who assists her with ADLs      Prior Function Level of Independence: Needs assistance  Gait / Transfers Assistance Needed: pt reports she ambulates with rollator ADL's / Homemaking Assistance Needed: Per PT report, personal care assistant assists with ADL tasks 7 days/week, 3 hours/day Comments: pt reports mobility this session is baseline   PT Goals (current goals can now be found in the care plan section) Acute Rehab PT Goals Patient Stated Goal: to feel better     Frequency    Min 3X/week      PT Plan Current plan remains appropriate    Co-evaluation              AM-PAC PT "6 Clicks" Daily Activity  Outcome Measure  Difficulty turning over in bed (including adjusting bedclothes, sheets and blankets)?: A Lot Difficulty moving from lying on back to sitting on the side of the bed? : Unable Difficulty sitting down on and standing up from a chair with arms (e.g., wheelchair, bedside commode, etc,.)?: Unable Help needed moving to and from a bed to chair (including a wheelchair)?: A Lot Help needed walking in hospital room?: A Lot Help needed climbing 3-5 steps with a railing? : Total 6 Click Score: 9    End of Session Equipment Utilized During Treatment: Gait belt Activity Tolerance: Patient limited by lethargy Patient left: in bed;with call bell/phone within reach;with bed alarm set;with family/visitor present   PT Visit Diagnosis: Difficulty in walking, not elsewhere classified (R26.2);Muscle weakness (generalized) (M62.81);Unsteadiness on feet (R26.81)     Time: 9702-6378 PT Time Calculation (min) (ACUTE ONLY):  16 min  Charges:  $Therapeutic Activity: 8-22 mins                    G Codes:       Amber [name], PT, DPT Acute Rehabilitation Services  Pager: [phone]    Vanetta Mulders 05/09/2018, 5:34 PM

## 2018-05-09 NOTE — Progress Notes (Signed)
OT Note - Addendum    05/09/18 1600  OT Visit Information  Last OT Received On 05/09/18  OT Time Calculation  OT Start Time (ACUTE ONLY) 1424  OT Stop Time (ACUTE ONLY) 1500  OT Time Calculation (min) 36 min  OT General Charges  $OT Visit 1 Visit  OT Evaluation  $OT Eval Moderate Complexity 1 Mod  OT Treatments  $Self Care/Home Management  8-22 mins  Luisa Dago, OT/L  OT Clinical Specialist [phone]

## 2018-05-09 NOTE — Progress Notes (Signed)
Pt states that 10mg  oxycodone is not relieving her pain.  She stated that she normally takes 20mg  every 6 hours at home.  Paged [facility] provider K. Schorr, who ordered a one-time dose of oxycodone IR 5mg  and advised that patient should review the concern with the hospitalist in the morning.  Will continue to monitor.

## 2018-05-09 NOTE — Progress Notes (Signed)
CSW received consult regarding PT recommendation of SNF at discharge.  Patient is refusing SNF. She does not want to go to a facility. She requests to return home at discharge with her son. She would like home health services but cannot remember what company she uses. She states she does not need any equipment. Son will transport patient. RNCM updated.   CSW signing off.   Osborne Casco Shanese Riemenschneider LCSW [phone]

## 2018-05-10 ENCOUNTER — Inpatient Hospital Stay (HOSPITAL_COMMUNITY): Payer: Medicaid Other

## 2018-05-10 DIAGNOSIS — R112 Nausea with vomiting, unspecified: Secondary | ICD-10-CM

## 2018-05-10 LAB — GLUCOSE, CAPILLARY
Glucose-Capillary: 73 mg/dL (ref 65–99)
Glucose-Capillary: 80 mg/dL (ref 65–99)
Glucose-Capillary: 123 mg/dL — ABNORMAL HIGH (ref 65–99)

## 2018-05-10 LAB — CBC
HCT: 25 % — ABNORMAL LOW (ref 36.0–46.0)
Hemoglobin: 8.1 g/dL — ABNORMAL LOW (ref 12.0–15.0)
MCH: 25.2 pg — ABNORMAL LOW (ref 26.0–34.0)
MCHC: 32.4 g/dL (ref 30.0–36.0)
MCV: 77.6 fL — ABNORMAL LOW (ref 78.0–100.0)
PLATELETS: 180 10*3/uL (ref 150–400)
RBC: 3.22 MIL/uL — ABNORMAL LOW (ref 3.87–5.11)
RDW: 15.2 % (ref 11.5–15.5)
WBC: 10 10*3/uL (ref 4.0–10.5)

## 2018-05-10 LAB — BASIC METABOLIC PANEL
Anion gap: 6 (ref 5–15)
BUN: 14 mg/dL (ref 6–20)
CO2: 20 mmol/L — ABNORMAL LOW (ref 22–32)
CREATININE: 0.99 mg/dL (ref 0.44–1.00)
Calcium: 8.4 mg/dL — ABNORMAL LOW (ref 8.9–10.3)
Chloride: 116 mmol/L — ABNORMAL HIGH (ref 101–111)
GFR calc Af Amer: >60 mL/min (ref >60)
GFR calc non Af Amer: >60 mL/min (ref >60)
Glucose, Bld: 80 mg/dL (ref 65–99)
Potassium: 5.3 mmol/L — ABNORMAL HIGH (ref 3.5–5.1)
SODIUM: 142 mmol/L (ref 135–145)

## 2018-05-10 LAB — AMMONIA: Ammonia: 20 umol/L (ref 9–35)

## 2018-05-10 LAB — HEPATIC FUNCTION PANEL
ALT: 22 U/L (ref 14–54)
AST: 33 U/L (ref 15–41)
Albumin: 2 g/dL — ABNORMAL LOW (ref 3.5–5.0)
Alkaline Phosphatase: 120 U/L (ref 38–126)
BILIRUBIN DIRECT: 0.2 mg/dL (ref 0.1–0.5)
BILIRUBIN INDIRECT: 0.4 mg/dL (ref 0.3–0.9)
Total Bilirubin: 0.6 mg/dL (ref 0.3–1.2)
Total Protein: 4.7 g/dL — ABNORMAL LOW (ref 6.5–8.1)

## 2018-05-10 LAB — VALPROIC ACID LEVEL: Valproic Acid Lvl: 41 ug/mL — ABNORMAL LOW (ref 50.0–100.0)

## 2018-05-10 LAB — MAGNESIUM: MAGNESIUM: 1.9 mg/dL (ref 1.7–2.4)

## 2018-05-10 MED ORDER — FAMOTIDINE 20 MG PO TABS
20.0000 mg | ORAL_TABLET | Freq: Two times a day (BID) | ORAL | Status: DC
  Administered 2018-05-10 – 2018-05-13 (×6): 20 mg via ORAL
  Filled 2018-05-10 (×6): qty 1

## 2018-05-10 NOTE — Progress Notes (Signed)
PROGRESS NOTE    Amber [name]  VFI:[id number] DOB: 10-Oct-1956 DOA: 05/06/2018 PCP: [name], MD   Brief Narrative:  Amber [name] is an 62 y.o. female with Amber [name] PMH of chronic pancreatitis who was admitted 05/06/2018 with Amber [name] chief complaint of abdominal pain associated with nausea and vomiting.  Assessment & Plan:   Principal Problem:   Nausea & vomiting Active Problems:   History of seizure   Diabetes mellitus type 2, controlled (HCC)   Essential hypertension   Hypomagnesemia   Abdominal pain   Drug-induced hyperglycemia   Chronic anemia   Moderate malnutrition (HCC)   PAF (paroxysmal atrial fibrillation) (HCC)   MRSA carrier   Acute metabolic encephalopathy   Metabolic acidosis   Mild persistent asthma    Acute metabolic encephalopathy Noted to be somnolent 05/08/2018 with associated myoclonic jerking.  Thought to be related to medications (neurontin and xanaflex). Son is supposed to bring med list but hasn't done this.  She still has myoclonic jerks and is slow to respond for me (unclear baseline).  Amber [name]&Ox1.  She follows commands, but neuro exam is difficult as she does this inconsistently.     Xanaflex was stopped.  Oxycodone decreased.  Neurontin stopped. Stop antiepileptics for now - will try to discuss again with sister (keppra and depakote level sent)  Of note, on discussion with sister (between pharmacy, son, sister), she notes that all antiepileptics had previously been discontinued.  I was unable to contact sister as listed contact was incorrect (and I was unable to contact the neurologist they noted she was seeing  Follow labs today EEG, MRI Consider neurology c/s   Epigastric abdominal pain/nausea & vomiting Known history of chronic pancreatitis.  CT of the abdomen and pelvis from outside facility performed 05/03/2018 and did not show any acute abnormalities.  Lipase/LFTs unremarkable.  Nausea and vomiting improved with supportive care.  Adrenal insufficiency unlikely  given cortisol of 12.4. All steroids discontinued.   - still c/o abdominal pain, but no significant pain on exam today  Hypomagnesemia Follow repeat, improved  Diabetes mellitus type 2, controlled (HCC)  hypoglycemia with chronic kidney disease renal complications without long-term use of insulin Managed with metformin at home.  Had Kielyn Kardell low glucose of 58 on 05/07/2018, so SSI was discontinued.  Current hemoglobin A1c 5.7%.   She has persistent metabolic acidosis and plan was to d/c metformin.   Prednisone for asthma has been d/c'd, hopefully BG will improve with this (prednisone d/c'd 6/19) Diabetes coordinator recommends discharge home off hypoglycemics.    NAGMA: continue to monitor, improved today    Essential hypertension Currently controlled, mildly elevated today   History of seizure disorder (HCC) Holding all antiepileptics as apparently these were previously discontinued Will need to discuss again with sister to confirm    Chronic anemia Stable with no current indication for transfusion.    Moderate malnutrition (HCC) Continue supplementation per dietitian recommendations. Body mass index is 18.89 kg/m.    PAF (paroxysmal atrial fibrillation) (HCC) Controlled on metoprolol.  Continue apixaban.    AKI/stage III chronic kidney disease/metabolic acidosis Creatinine improved. Still has metabolic acidosis.    MRSA carrier Was maintained on contact isolation.    Mild persistent asthma in Amber [name] current non-smoker No wheezing, currently controlled.  Continue Dulera and as needed bronchodilators.  Currently smoking.  Hyperkalemia: mild, follow - d/c supplemental K  DVT prophylaxis: eliquis Code Status: full  Family Communication: son 6/21 Disposition Plan: pending   Consultants:   none  Procedures:  none  Antimicrobials: Anti-infectives (From admission, onward)   None      Subjective: Amber [name]&Ox1 C/o abdominal pain.  Objective: Vitals:   05/09/18  2129 05/09/18 2254 05/10/18 0516 05/10/18 0524  BP:  (!) 158/100 (!) 158/133 (!) 144/88  Pulse: 83 85 79   Resp: 16 17 18    Temp:  98.3 F (36.8 C) 98.2 F (36.8 C)   TempSrc:  Oral Oral   SpO2: 100%  100%   Weight:      Height:        Intake/Output Summary (Last 24 hours) at 05/10/2018 0846 Last data filed at 05/10/2018 0500 Gross per 24 hour  Intake 127.59 ml  Output 2400 ml  Net -2272.41 ml   Filed Weights   05/06/18 1723 05/07/18 0030  Weight: 47.6 kg (105 lb) 43.9 kg (96 lb 11.2 oz)    Examination:  General exam: Appears calm and comfortable  Respiratory system: Clear to auscultation. Respiratory effort normal. Cardiovascular system: S1 & S2 heard, RRR. No JVD, murmurs, rubs, gallops or clicks. No pedal edema. Gastrointestinal system: Abdomen is nondistended, soft and no significant ttp on exam Central nervous system: Alert and oriented x1. Myoclonic jerks.  Follows commands inconsistently.  Able to wiggle toes and move arms, but only after repeated requests (lifted arms eventually, but only wiggled toes) Extremities: no lee Skin: No rashes, lesions or ulcers Psychiatry: Judgement and insight appear normal. Mood & affect appropriate.     Data Reviewed: I have personally reviewed following labs and imaging studies  CBC: Recent Labs  Lab 05/06/18 1741 05/07/18 0335  WBC 8.1 5.7  HGB 8.7* 8.1*  HCT 27.0* 25.6*  MCV 78.3 79.0  PLT 164 175   Basic Metabolic Panel: Recent Labs  Lab 05/06/18 1741 05/07/18 0335 05/08/18 1614 05/09/18 0239  NA 141 138  --  144  K 4.5 4.5  --  3.7  CL 114* 114*  --  119*  CO2 21* 18*  --  16*  GLUCOSE 58* 157*  --  97  BUN 13 11  --  10  CREATININE 1.09* 0.97  --  0.90  CALCIUM 8.4* 7.8*  --  8.0*  MG  --   --  1.2*  --    GFR: Estimated Creatinine Clearance: 46.6 mL/min (by C-G formula based on SCr of 0.9 mg/dL). Liver Function Tests: Recent Labs  Lab 05/06/18 1741 05/07/18 0335  AST 31 35  ALT 21 20  ALKPHOS  131* 117  BILITOT 0.5 0.5  PROT 5.3* 4.6*  ALBUMIN 2.1* 1.8*   Recent Labs  Lab 05/06/18 1741  LIPASE 22   Recent Labs  Lab 05/08/18 1614  AMMONIA 21   Coagulation Profile: No results for input(s): INR, PROTIME in the last 168 hours. Cardiac Enzymes: No results for input(s): CKTOTAL, CKMB, CKMBINDEX, TROPONINI in the last 168 hours. BNP (last 3 results) No results for input(s): PROBNP in the last 8760 hours. HbA1C: Recent Labs    05/08/18 1114  HGBA1C 5.7*   CBG: Recent Labs  Lab 05/09/18 1258 05/09/18 1704 05/09/18 2006 05/09/18 2205 05/10/18 0802  GLUCAP 86 81 74 117* 73   Lipid Profile: No results for input(s): CHOL, HDL, LDLCALC, TRIG, CHOLHDL, LDLDIRECT in the last 72 hours. Thyroid Function Tests: No results for input(s): TSH, T4TOTAL, FREET4, T3FREE, THYROIDAB in the last 72 hours. Anemia Panel: No results for input(s): VITAMINB12, FOLATE, FERRITIN, TIBC, IRON, RETICCTPCT in the last 72 hours. Sepsis Labs: No results for input(s): PROCALCITON, LATICACIDVEN  in the last 168 hours.  Recent Results (from the past 240 hour(s))  MRSA PCR Screening     Status: Abnormal   Collection Time: 05/07/18  3:44 AM  Result Value Ref Range Status   MRSA by PCR POSITIVE ([place]) NEGATIVE Final    Comment:        The GeneXpert MRSA Assay (FDA approved for NASAL specimens only), is one component of Anya Murphey comprehensive MRSA colonization surveillance program. It is not intended to diagnose MRSA infection nor to guide or monitor treatment for MRSA infections. RESULT CALLED TO, READ BACK BY AND VERIFIED WITH: Maximino Greenland RN 05/07/18 3013 JDW Performed at [facility], [address], [place], [place] [address]          Radiology Studies: No results found.      Scheduled Meds: . allopurinol  300 mg Oral Daily  . apixaban  5 mg Oral BID  . aspirin  325 mg Oral Daily  . atorvastatin  10 mg Oral Daily  . Chlorhexidine Gluconate Cloth  6 each Topical Q0600  .  docusate sodium  100 mg Oral Daily  . feeding supplement (GLUCERNA SHAKE)  237 mL Oral TID BM  . ferrous sulfate  325 mg Oral Q breakfast  . folic acid  1 mg Oral Daily  . lacosamide  100 mg Oral BID  . levETIRAcetam  500 mg Oral BID  . lidocaine  1 patch Transdermal Q24H  . metoprolol succinate  25 mg Oral Daily  . mometasone-formoterol  2 puff Inhalation BID  . multivitamin with minerals  1 tablet Oral Daily  . mupirocin ointment  1 application Nasal BID  . pantoprazole  80 mg Oral Daily  . potassium chloride  20 mEq Oral BID  . valproic acid  500 mg Oral Q12H   Continuous Infusions: . sodium chloride Stopped (05/10/18 0509)  . famotidine (PEPCID) IV Stopped (05/09/18 2239)     LOS: 2 days    Time spent: over 30 min    Lacretia Nicks, MD [facility] Pager [phone]   If 7PM-7AM, please contact night-coverage [email or web address] Password [facility] 05/10/2018, 8:46 AM

## 2018-05-10 NOTE — Progress Notes (Signed)
EEG Complete. Results pending. 

## 2018-05-10 NOTE — Procedures (Signed)
Date of recording 05/09/2018  Referring physician [facility]  Technical Digital EEG recording using 10-20 international electrode system  Reason for the study Altered mental status  Description of the recording When awake posterior dominant rhythm is 6-7 bilateral reactive,  EEG comprised of generalized delta and sometimes rhythmic (GRD)intermixed with generalized triphasic waves with frontal predominance. Sleep architecture was not seen. Epileptiform features were not seen during this recording.  Impression The EEG is abnormal and findings are consistent with  moderate generalized cerebral dysfunction.  Certain findings observed can be seen in metabolic encephalopathy. Epileptiform features were not seen during this recording.

## 2018-05-11 ENCOUNTER — Inpatient Hospital Stay (HOSPITAL_COMMUNITY): Payer: Medicaid Other

## 2018-05-11 LAB — HEPATIC FUNCTION PANEL
ALT: 20 U/L (ref 14–54)
AST: 24 U/L (ref 15–41)
Albumin: 2.1 g/dL — ABNORMAL LOW (ref 3.5–5.0)
Alkaline Phosphatase: 123 U/L (ref 38–126)
BILIRUBIN DIRECT: 0.2 mg/dL (ref 0.1–0.5)
BILIRUBIN INDIRECT: 0.5 mg/dL (ref 0.3–0.9)
BILIRUBIN TOTAL: 0.7 mg/dL (ref 0.3–1.2)
Total Protein: 5.2 g/dL — ABNORMAL LOW (ref 6.5–8.1)

## 2018-05-11 LAB — BLOOD GAS, VENOUS
Acid-base deficit: 1.4 mmol/L (ref 0.0–2.0)
Bicarbonate: 22.1 mmol/L (ref 20.0–28.0)
O2 Saturation: 86.5 %
PCO2 VEN: 33.1 mmHg — AB (ref 44.0–60.0)
PH VEN: 7.441 — AB (ref 7.250–7.430)
Patient temperature: 98.6
pO2, Ven: 52.9 mmHg — ABNORMAL HIGH (ref 32.0–45.0)

## 2018-05-11 LAB — MAGNESIUM: Magnesium: 1.8 mg/dL (ref 1.7–2.4)

## 2018-05-11 LAB — BASIC METABOLIC PANEL
Anion gap: 6 (ref 5–15)
BUN: 16 mg/dL (ref 6–20)
CALCIUM: 8.1 mg/dL — AB (ref 8.9–10.3)
CHLORIDE: 107 mmol/L (ref 101–111)
CO2: 21 mmol/L — AB (ref 22–32)
CREATININE: 0.96 mg/dL (ref 0.44–1.00)
GFR calc non Af Amer: >60 mL/min (ref >60)
GLUCOSE: 108 mg/dL — AB (ref 65–99)
Potassium: 4.5 mmol/L (ref 3.5–5.1)
Sodium: 134 mmol/L — ABNORMAL LOW (ref 135–145)

## 2018-05-11 LAB — CBC
HCT: 25 % — ABNORMAL LOW (ref 36.0–46.0)
Hemoglobin: 8.3 g/dL — ABNORMAL LOW (ref 12.0–15.0)
MCH: 25.1 pg — AB (ref 26.0–34.0)
MCHC: 33.2 g/dL (ref 30.0–36.0)
MCV: 75.5 fL — AB (ref 78.0–100.0)
PLATELETS: 169 10*3/uL (ref 150–400)
RBC: 3.31 MIL/uL — ABNORMAL LOW (ref 3.87–5.11)
RDW: 14.6 % (ref 11.5–15.5)
WBC: 7.9 10*3/uL (ref 4.0–10.5)

## 2018-05-11 LAB — GLUCOSE, CAPILLARY
GLUCOSE-CAPILLARY: 136 mg/dL — AB (ref 65–99)
GLUCOSE-CAPILLARY: 94 mg/dL (ref 65–99)
Glucose-Capillary: 65 mg/dL (ref 65–99)
Glucose-Capillary: 103 mg/dL — ABNORMAL HIGH (ref 65–99)
Glucose-Capillary: 118 mg/dL — ABNORMAL HIGH (ref 65–99)

## 2018-05-11 MED ORDER — BISACODYL 10 MG RE SUPP
10.0000 mg | Freq: Every day | RECTAL | Status: DC | PRN
  Administered 2018-05-11: 10 mg via RECTAL
  Filled 2018-05-11: qty 1

## 2018-05-11 MED ORDER — OXYCODONE-ACETAMINOPHEN 5-325 MG PO TABS: 1.0000 | ORAL_TABLET | Freq: Four times a day (QID) | ORAL | Status: DC | PRN

## 2018-05-11 MED ORDER — OXYCODONE HCL 5 MG PO TABS
5.0000 mg | ORAL_TABLET | Freq: Four times a day (QID) | ORAL | Status: DC | PRN
  Administered 2018-05-11 – 2018-05-12 (×4): 5 mg via ORAL
  Filled 2018-05-11 (×4): qty 1

## 2018-05-11 MED ORDER — POLYETHYLENE GLYCOL 3350 17 G PO PACK
17.0000 g | PACK | Freq: Two times a day (BID) | ORAL | Status: DC
  Administered 2018-05-11 – 2018-05-13 (×4): 17 g via ORAL
  Filled 2018-05-11 (×5): qty 1

## 2018-05-11 NOTE — Progress Notes (Signed)
PROGRESS NOTE    Amber [name]  LMB:[id number] DOB: [birth year] DOA: 05/06/2018 PCP: [name], MD   Brief Narrative:  Amber [name] is an 62 y.o. female with Amber [name] PMH of chronic pancreatitis who was admitted 05/06/2018 with [name] chief complaint of abdominal pain associated with nausea and vomiting.  Assessment & Plan:   Principal Problem:   Nausea & vomiting Active Problems:   History of seizure   Diabetes mellitus type 2, controlled (HCC)   Essential hypertension   Hypomagnesemia   Abdominal pain   Drug-induced hyperglycemia   Chronic anemia   Moderate malnutrition (HCC)   PAF (paroxysmal atrial fibrillation) (HCC)   MRSA carrier   Acute metabolic encephalopathy   Metabolic acidosis   Mild persistent asthma    Acute metabolic encephalopathy Noted to be somnolent 05/08/2018 with associated myoclonic jerking.  At this point in time, thought to be related to medications (she was apparently not supposed to be on any antiepileptics - her neurontin was also supposed to be d/c'd - we also stopped her xanaflex). Son is supposed to bring med list but hasn't done this yet, though sister clarified some meds with pharmacy yesterday.  She still has myoclonic jerks and is slow to respond for me (unclear baseline), but seems slightly improved from yesterday.  [name]&Ox2-3 today (knew year, but not month).  She follows commands, but neuro exam is difficult as she does this inconsistently, but she did [name] bit better than yesterday.   Xanaflex stopped, neurontin stopped.  Keppra, depakene, and vimpat discontinued.   Will try to discuss again with sister (keppra and depakote level sent)  Of note, on discussion with sister (between pharmacy, son, sister), she notes that all antiepileptics had previously been discontinued.  I was unable to contact sister as listed contact was incorrect (and I was unable to contact the neurologist they noted she was seeing).  Follow labs today EEG (with moderate generalized  cerebral dysfunction - finding c/w metabolic encephalopathy), MRI (without acute infarct) Consider neurology c/s   Epigastric abdominal pain/nausea & vomiting Known history of chronic pancreatitis.  CT of the abdomen and pelvis from outside facility performed 05/03/2018 and did not show any acute abnormalities.  Lipase/LFTs unremarkable.  Nausea and vomiting improved with supportive care.  Adrenal insufficiency unlikely given cortisol of 12.4. All steroids discontinued.   - still c/o abdominal pain, diffuse tenderness on my exam today - follow KUB and pending labs - pain control - bowel regimen  Hypomagnesemia Follow repeat, improved  Diabetes mellitus type 2, controlled (HCC)  hypoglycemia with chronic kidney disease renal complications without long-term use of insulin Managed with metformin at home.   Had [name] low glucose of 58 on 05/07/2018, so SSI was discontinued. Low BG 6/22 again to 65 (pt did not eat dinner past night)  Current hemoglobin A1c 5.7%.   She has persistent metabolic acidosis and plan was to d/c metformin.   Prednisone for asthma has been d/c'd, hopefully BG will improve with this (prednisone d/c'd 6/19)  Diabetes coordinator recommends discharge home off medications.    NAGMA: continue to monitor, improved today.  Follow VBG.    Essential hypertension Continue metop, still with elevations, consider adding amlodipine   History of seizure disorder (HCC) Holding all antiepileptics as apparently these were previously discontinued Seizure precautions Will need to discuss again with sister to confirm    Chronic anemia Stable with no current indication for transfusion.    Moderate malnutrition (HCC) Continue supplementation per dietitian recommendations. Body mass  index is 18.89 kg/m.    PAF (paroxysmal atrial fibrillation) (HCC) Controlled on metoprolol.  Continue apixaban.    AKI/stage III chronic kidney disease/metabolic acidosis Creatinine improved.  Still has metabolic acidosis.    MRSA carrier Was maintained on contact isolation.    Mild persistent asthma in [name] current non-smoker No wheezing, currently controlled.  Continue Dulera and as needed bronchodilators.  Currently smoking.  Hyperkalemia: mild, follow - d/c supplemental K  DVT prophylaxis: eliquis Code Status: full  Family Communication: son 6/21 (unable to reach 6/22) Disposition Plan: pending   Consultants:   none  Procedures:   none  Antimicrobials: Anti-infectives (From admission, onward)   None      Subjective: C/o abdominal pain.  Pain "all over". Asking for pain medication.  Objective: Vitals:   05/10/18 2039 05/10/18 2128 05/10/18 2130 05/11/18 0526  BP:  (!) 159/96 (!) 159/96 (!) 140/101  Pulse:  89 91 91  Resp:  18  18  Temp:  98 F (36.7 C)  (!) 97.5 F (36.4 C)  TempSrc:  Oral  Oral  SpO2: 99% 100%  100%  Weight:      Height:        Intake/Output Summary (Last 24 hours) at 05/11/2018 0829 Last data filed at 05/11/2018 0000 Gross per 24 hour  Intake 17.5 ml  Output 650 ml  Net -632.5 ml   Filed Weights   05/06/18 1723 05/07/18 0030  Weight: 47.6 kg (105 lb) 43.9 kg (96 lb 11.2 oz)    Examination:  General: No acute distress. Cardiovascular: Heart sounds show Tiombe Tomeo regular rate, and rhythm. No gallops or rubs. No murmurs. No JVD. Lungs: Clear to auscultation bilaterally with good air movement. No rales, rhonchi or wheezes. Abdomen: Soft, diffusely tender, nondistended with normal active bowel sounds. No masses. No hepatosplenomegaly. Neurological: Alert and oriented 3. Moves all extremities 4.  Followed commands better than yesterday, but still with only repeated requests and slowly. Persistent jerking, maybe slightly improved. Skin: Warm and dry. No rashes or lesions. Extremities: No clubbing or cyanosis. No edema.     Data Reviewed: I have personally reviewed following labs and imaging studies  CBC: Recent Labs  Lab  05/06/18 1741 05/07/18 0335 05/10/18 0854  WBC 8.1 5.7 10.0  HGB 8.7* 8.1* 8.1*  HCT 27.0* 25.6* 25.0*  MCV 78.3 79.0 77.6*  PLT 164 175 180   Basic Metabolic Panel: Recent Labs  Lab 05/06/18 1741 05/07/18 0335 05/08/18 1614 05/09/18 0239 05/10/18 0854  NA 141 138  --  144 142  K 4.5 4.5  --  3.7 5.3*  CL 114* 114*  --  119* 116*  CO2 21* 18*  --  16* 20*  GLUCOSE 58* 157*  --  97 80  BUN 13 11  --  10 14  CREATININE 1.09* 0.97  --  0.90 0.99  CALCIUM 8.4* 7.8*  --  8.0* 8.4*  MG  --   --  1.2*  --  1.9   GFR: Estimated Creatinine Clearance: 42.4 mL/min (by C-G formula based on SCr of 0.99 mg/dL). Liver Function Tests: Recent Labs  Lab 05/06/18 1741 05/07/18 0335 05/10/18 0854  AST 31 35 33  ALT 21 20 22   ALKPHOS 131* 117 120  BILITOT 0.5 0.5 0.6  PROT 5.3* 4.6* 4.7*  ALBUMIN 2.1* 1.8* 2.0*   Recent Labs  Lab 05/06/18 1741  LIPASE 22   Recent Labs  Lab 05/08/18 1614 05/10/18 0854  AMMONIA 21 20   Coagulation  Profile: No results for input(s): INR, PROTIME in the last 168 hours. Cardiac Enzymes: No results for input(s): CKTOTAL, CKMB, CKMBINDEX, TROPONINI in the last 168 hours. BNP (last 3 results) No results for input(s): PROBNP in the last 8760 hours. HbA1C: Recent Labs    05/08/18 1114  HGBA1C 5.7*   CBG: Recent Labs  Lab 05/09/18 2205 05/10/18 0802 05/10/18 1711 05/10/18 2127 05/11/18 0821  GLUCAP 117* 73 80 123* 65   Lipid Profile: No results for input(s): CHOL, HDL, LDLCALC, TRIG, CHOLHDL, LDLDIRECT in the last 72 hours. Thyroid Function Tests: No results for input(s): TSH, T4TOTAL, FREET4, T3FREE, THYROIDAB in the last 72 hours. Anemia Panel: No results for input(s): VITAMINB12, FOLATE, FERRITIN, TIBC, IRON, RETICCTPCT in the last 72 hours. Sepsis Labs: No results for input(s): PROCALCITON, LATICACIDVEN in the last 168 hours.  Recent Results (from the past 240 hour(s))  MRSA PCR Screening     Status: Abnormal   Collection  Time: 05/07/18  3:44 AM  Result Value Ref Range Status   MRSA by PCR POSITIVE (Lara Palinkas) NEGATIVE Final    Comment:        The GeneXpert MRSA Assay (FDA approved for NASAL specimens only), is one component of Wesleigh Markovic comprehensive MRSA colonization surveillance program. It is not intended to diagnose MRSA infection nor to guide or monitor treatment for MRSA infections. RESULT CALLED TO, READ BACK BY AND VERIFIED WITH: Maximino Greenland RN 05/07/18 2620 JDW Performed at [facility], [address], [place], [place] [address]          Radiology Studies: Mr Brain Wo Contrast  Result Date: 05/10/2018 CLINICAL DATA:  Altered level of consciousness. EXAM: MRI HEAD WITHOUT CONTRAST TECHNIQUE: Multiplanar, multiecho pulse sequences of the brain and surrounding structures were obtained without intravenous contrast. COMPARISON:  CT 10/20/2017.  MRI head 10/26/2016 FINDINGS: Brain: Negative for acute infarct. Chronic microvascular ischemic change in the white matter and basal ganglia. 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 Vascular: Normal arterial flow voids. Skull and upper cervical spine: Anterolisthesis C3-4. Retrolisthesis C5-6. This appears degenerative. Posterior hardware fusion in the lower cervical spine. No skull lesion. Sinuses/Orbits: Negative Other: Image quality degraded by motion IMPRESSION: Negative for acute infarct.  Chronic ischemic changes diffusely. Electronically Signed   By: Marlan Palau M.D.   On: 05/10/2018 13:22        Scheduled Meds: . allopurinol  300 mg Oral Daily  . apixaban  5 mg Oral BID  . aspirin  325 mg Oral Daily  . atorvastatin  10 mg Oral Daily  . Chlorhexidine Gluconate Cloth  6 each Topical Q0600  . docusate sodium  100 mg Oral Daily  . famotidine  20 mg Oral BID  . feeding supplement (GLUCERNA SHAKE)  237 mL Oral TID BM  . ferrous sulfate  325 mg Oral Q breakfast  . folic acid  1  mg Oral Daily  . lidocaine  1 patch Transdermal Q24H  . metoprolol succinate  25 mg Oral Daily  . mometasone-formoterol  2 puff Inhalation BID  . multivitamin with minerals  1 tablet Oral Daily  . mupirocin ointment  1 application Nasal BID  . pantoprazole  80 mg Oral Daily   Continuous Infusions: . sodium chloride Stopped (05/10/18 0509)     LOS: 3 days    Time spent: over 30 min    Lacretia Nicks, MD [facility] Pager [phone]   If 7PM-7AM, please  contact night-coverage [email or web address] Password [facility] 05/11/2018, 8:29 AM

## 2018-05-11 NOTE — Discharge Instructions (Signed)

## 2018-05-12 LAB — HEPATIC FUNCTION PANEL
ALT: 17 U/L (ref 14–54)
AST: 19 U/L (ref 15–41)
Albumin: 1.8 g/dL — ABNORMAL LOW (ref 3.5–5.0)
Alkaline Phosphatase: 111 U/L (ref 38–126)
Bilirubin, Direct: 0.2 mg/dL (ref 0.1–0.5)
Indirect Bilirubin: 0.6 mg/dL (ref 0.3–0.9)
TOTAL PROTEIN: 4.6 g/dL — AB (ref 6.5–8.1)
Total Bilirubin: 0.8 mg/dL (ref 0.3–1.2)

## 2018-05-12 LAB — CBC
HCT: 20.7 % — ABNORMAL LOW (ref 36.0–46.0)
Hemoglobin: 6.9 g/dL — CL (ref 12.0–15.0)
MCH: 25.2 pg — ABNORMAL LOW (ref 26.0–34.0)
MCHC: 33.3 g/dL (ref 30.0–36.0)
MCV: 75.5 fL — AB (ref 78.0–100.0)
PLATELETS: 144 10*3/uL — AB (ref 150–400)
RBC: 2.74 MIL/uL — AB (ref 3.87–5.11)
RDW: 14.4 % (ref 11.5–15.5)
WBC: 8.3 10*3/uL (ref 4.0–10.5)

## 2018-05-12 LAB — GLUCOSE, CAPILLARY
GLUCOSE-CAPILLARY: 121 mg/dL — AB (ref 65–99)
GLUCOSE-CAPILLARY: 151 mg/dL — AB (ref 65–99)
Glucose-Capillary: 93 mg/dL (ref 65–99)
Glucose-Capillary: 124 mg/dL — ABNORMAL HIGH (ref 65–99)

## 2018-05-12 LAB — PREPARE RBC (CROSSMATCH)

## 2018-05-12 LAB — BASIC METABOLIC PANEL
ANION GAP: 4 — AB (ref 5–15)
BUN: 19 mg/dL (ref 6–20)
CO2: 22 mmol/L (ref 22–32)
Calcium: 8 mg/dL — ABNORMAL LOW (ref 8.9–10.3)
Chloride: 110 mmol/L (ref 101–111)
Creatinine, Ser: 1.06 mg/dL — ABNORMAL HIGH (ref 0.44–1.00)
GFR calc non Af Amer: 56 mL/min — ABNORMAL LOW (ref >60)
GLUCOSE: 94 mg/dL (ref 65–99)
Potassium: 4.7 mmol/L (ref 3.5–5.1)
Sodium: 136 mmol/L (ref 135–145)

## 2018-05-12 LAB — IRON AND TIBC
Iron: 77 ug/dL (ref 28–170)
Saturation Ratios: 71 % — ABNORMAL HIGH (ref 10.4–31.8)
TIBC: 108 ug/dL — ABNORMAL LOW (ref 250–450)
UIBC: 31 ug/dL

## 2018-05-12 LAB — HEMOGLOBIN AND HEMATOCRIT, BLOOD
HEMATOCRIT: 20.1 % — AB (ref 36.0–46.0)
HEMATOCRIT: 28.2 % — AB (ref 36.0–46.0)
HEMOGLOBIN: 9.7 g/dL — AB (ref 12.0–15.0)
Hemoglobin: 6.7 g/dL — CL (ref 12.0–15.0)

## 2018-05-12 LAB — MAGNESIUM: MAGNESIUM: 1.8 mg/dL (ref 1.7–2.4)

## 2018-05-12 LAB — FERRITIN: FERRITIN: 319 ng/mL — AB (ref 11–307)

## 2018-05-12 LAB — RETICULOCYTES
RBC.: 3.25 MIL/uL — ABNORMAL LOW (ref 3.87–5.11)
RETIC COUNT ABSOLUTE: 55.3 10*3/uL (ref 19.0–186.0)
RETIC CT PCT: 1.7 % (ref 0.4–3.1)

## 2018-05-12 LAB — VITAMIN B12: Vitamin B-12: 954 pg/mL — ABNORMAL HIGH (ref 180–914)

## 2018-05-12 LAB — OCCULT BLOOD X 1 CARD TO LAB, STOOL: FECAL OCCULT BLD: NEGATIVE

## 2018-05-12 LAB — LACTATE DEHYDROGENASE: LDH: 158 U/L (ref 98–192)

## 2018-05-12 LAB — FOLATE: Folate: 45.2 ng/mL (ref >5.9)

## 2018-05-12 MED ORDER — OXYCODONE HCL 5 MG PO TABS
10.0000 mg | ORAL_TABLET | Freq: Four times a day (QID) | ORAL | Status: DC | PRN
  Administered 2018-05-12 – 2018-05-13 (×4): 10 mg via ORAL
  Filled 2018-05-12 (×4): qty 2

## 2018-05-12 MED ORDER — LEVETIRACETAM 500 MG PO TABS
500.0000 mg | ORAL_TABLET | Freq: Two times a day (BID) | ORAL | Status: DC
  Administered 2018-05-12 – 2018-05-13 (×3): 500 mg via ORAL
  Filled 2018-05-12 (×3): qty 1

## 2018-05-12 MED ORDER — ALBUTEROL SULFATE (2.5 MG/3ML) 0.083% IN NEBU
3.0000 mL | INHALATION_SOLUTION | RESPIRATORY_TRACT | Status: DC | PRN
Start: 2018-05-12 — End: 2018-05-12

## 2018-05-12 MED ORDER — GABAPENTIN 300 MG PO CAPS
300.0000 mg | ORAL_CAPSULE | Freq: Every day | ORAL | Status: DC
  Administered 2018-05-12: 300 mg via ORAL
  Filled 2018-05-12: qty 1

## 2018-05-12 MED ORDER — ALBUTEROL SULFATE (2.5 MG/3ML) 0.083% IN NEBU: 3.0000 mL | INHALATION_SOLUTION | Freq: Four times a day (QID) | RESPIRATORY_TRACT | Status: DC | PRN

## 2018-05-12 MED ORDER — TRAZODONE HCL 50 MG PO TABS
50.0000 mg | ORAL_TABLET | Freq: Once | ORAL | Status: AC
  Administered 2018-05-12: 50 mg via ORAL
  Filled 2018-05-12: qty 1

## 2018-05-12 MED ORDER — GABAPENTIN 300 MG PO CAPS: 300.0000 mg | ORAL_CAPSULE | Freq: Two times a day (BID) | ORAL | Status: DC

## 2018-05-12 MED ORDER — SODIUM CHLORIDE 0.9% IV SOLUTION
Freq: Once | INTRAVENOUS | Status: AC
  Administered 2018-05-12: 16:00:00 via INTRAVENOUS

## 2018-05-12 NOTE — Progress Notes (Signed)
Notified Dr. Lowell Guitar, by secure chat that hbg was 6.9 this morning and that pt had 2 large bm's yesterday afternoon.

## 2018-05-12 NOTE — Progress Notes (Signed)
PROGRESS NOTE    Amber [name]  SFS:[id number] DOB: 03/25/1956 DOA: 05/06/2018 PCP: [name], MD   Brief Narrative:  Amber [name] is an 62 y.o. female with Amber [name] PMH of chronic pancreatitis who was admitted 05/06/2018 with Amber [name] chief complaint of abdominal pain associated with nausea and vomiting.  Assessment & Plan:   Principal Problem:   Nausea & vomiting Active Problems:   History of seizure   Diabetes mellitus type 2, controlled (HCC)   Essential hypertension   Hypomagnesemia   Abdominal pain   Drug-induced hyperglycemia   Chronic anemia   Moderate malnutrition (HCC)   PAF (paroxysmal atrial fibrillation) (HCC)   MRSA carrier   Acute metabolic encephalopathy   Metabolic acidosis   Mild persistent asthma    Acute metabolic encephalopathy Noted to be somnolent 05/08/2018 with associated myoclonic jerking.  At this point in time, thought to be related to medications  She's much improved now Son is supposed to bring med list but hasn't done this yet, though sister clarified some meds with pharmacy on 6/21.  Amber [name] today with nonfocal neuro exam..   She's able to clarify her medications today.  She notes that she's still on keppra, but vimpat and depakene were discontinued.  She notes she's on gabapentin 300 bid and also that she is still taking xanaflex.   Will try to discuss again with sister - called today, no answer, left message (keppra and depakote level sent) -> reviewed meds with pt today and there were some differences b/n what the pt said and what the daughter reported, it'd be ideal to discuss again with family. EEG (with moderate generalized cerebral dysfunction - finding c/w metabolic encephalopathy), MRI (without acute infarct)  Acute on Chronic anemia Drop to 6.7 from 8.3 yesterday Hemodynamically stable Negative hemoccult Holding eliquis and aspirin (she says aspirin had previously been d/c'd) Discussed risk/benefits of transfusion.  Pt agreeable to  transfusion. Transfuse 1 unit pRBC Follow b12, folate, iron panel, ferritin, retic, LDH, haptoglobin  Post transfusion H/H   Epigastric abdominal pain/nausea & vomiting Known history of chronic pancreatitis.  CT of the abdomen and pelvis from outside facility performed 05/03/2018 and did not show any acute abnormalities.  Lipase/LFTs unremarkable.  Nausea and vomiting improved with supportive care.  Adrenal insufficiency unlikely given cortisol of 12.4. All steroids discontinued.   - seems improved - follow KUB (nonobstructive bowel gas pattern with mild fecal retention throughout the colon, stable spinal compression fx)  - pain control - bowel regimen  Hypomagnesemia Follow repeat, improved  Diabetes mellitus type 2, controlled (HCC)  hypoglycemia with chronic kidney disease renal complications without long-term use of insulin Managed with metformin at home.   Had Annelisa Ryback low glucose of 58 on 05/07/2018, so SSI was discontinued. Low BG 6/22 again to 65 (pt did not eat dinner past night)  Current hemoglobin A1c 5.7%.   She has persistent metabolic acidosis and plan was to d/c metformin.   Prednisone for asthma has been d/c'd, hopefully BG will improve with this (prednisone d/c'd 6/19)  Diabetes coordinator recommends discharge home off medications.   Will restart gabapentin for neuropathy at low dose as she confirms she was taking this  NAGMA: improved  Essential hypertension Continue metop, still with elevations in diastolic at times, but fluctuating, follow   History of seizure disorder (HCC) Restart keppra (she says she is on this) Seizure precautions Will need to discuss again with daugther to confirm    Moderate malnutrition (HCC) Continue supplementation per dietitian recommendations.  Body mass index is 18.89 kg/m.    PAF (paroxysmal atrial fibrillation) (HCC) Controlled on metoprolol.  Holding apixiban due to above    AKI  stage III chronic kidney disease   metabolic acidosis Creatinine improved.     MRSA carrier Was maintained on contact isolation.    Mild persistent asthma in Amber [name] current non-smoker No wheezing, currently controlled.  Continue Dulera and as needed bronchodilators.  Currently smoking.  Hyperkalemia: improved  DVT prophylaxis: d/c eliquis, scds Code Status: full  Family Communication: son 6/21 (unable to reach 6/22 or 6/23) Disposition Plan: pending   Consultants:   none  Procedures:   none  Antimicrobials: Anti-infectives (From admission, onward)   None      Subjective: Asking about going home. Still with chronic abdominal and back pain.  Objective: Vitals:   05/11/18 2207 05/12/18 0516 05/12/18 1224 05/12/18 1430  BP: 120/88 (!) 128/95 108/82 (!) 125/101  Pulse: (!) 102 93 92 (!) 108  Resp: 12 12 15 18   Temp: 98.5 F (36.9 C) 98.3 F (36.8 C) 98.9 F (37.2 C) 98.3 F (36.8 C)  TempSrc:   Oral Oral  SpO2: 100% 100% 100% 100%  Weight:      Height:        Intake/Output Summary (Last 24 hours) at 05/12/2018 1515 Last data filed at 05/12/2018 0900 Gross per 24 hour  Intake 480 ml  Output 1150 ml  Net -670 ml   Filed Weights   05/06/18 1723 05/07/18 0030  Weight: 47.6 kg (105 lb) 43.9 kg (96 lb 11.2 oz)    Examination:  General: No acute distress. Cardiovascular: Heart sounds show Markas Aldredge regular rate, and rhythm. No gallops or rubs. No murmurs. No JVD. Lungs: Clear to auscultation bilaterally with good air movement. No rales, rhonchi or wheezes. Abdomen: Soft, improved abdominal ttp, nondistended with normal active bowel sounds. No masses. No hepatosplenomegaly. Neurological: Alert and oriented. Moves all extremities 4 with equal strength. Cranial nerves II through XII grossly intact. Skin: Warm and dry. No rashes or lesions. Extremities: No clubbing or cyanosis. No edema. Pedal pulses 2+. Psychiatric: Mood and affect are normal. Insight and judgment are appropriate.   Data Reviewed: I  have personally reviewed following labs and imaging studies  CBC: Recent Labs  Lab 05/06/18 1741 05/07/18 0335 05/10/18 0854 05/11/18 1453 05/12/18 0542 05/12/18 1133  WBC 8.1 5.7 10.0 7.9 8.3  --   HGB 8.7* 8.1* 8.1* 8.3* 6.9* 6.7*  HCT 27.0* 25.6* 25.0* 25.0* 20.7* 20.1*  MCV 78.3 79.0 77.6* 75.5* 75.5*  --   PLT 164 175 180 169 144*  --    Basic Metabolic Panel: Recent Labs  Lab 05/07/18 0335 05/08/18 1614 05/09/18 0239 05/10/18 0854 05/11/18 1453 05/12/18 0542  NA 138  --  144 142 134* 136  K 4.5  --  3.7 5.3* 4.5 4.7  CL 114*  --  119* 116* 107 110  CO2 18*  --  16* 20* 21* 22  GLUCOSE 157*  --  97 80 108* 94  BUN 11  --  10 14 16 19   CREATININE 0.97  --  0.90 0.99 0.96 1.06*  CALCIUM 7.8*  --  8.0* 8.4* 8.1* 8.0*  MG  --  1.2*  --  1.9 1.8 1.8   GFR: Estimated Creatinine Clearance: 39.6 mL/min (Ilani Otterson) (by C-G formula based on SCr of 1.06 mg/dL (H)). Liver Function Tests: Recent Labs  Lab 05/06/18 1741 05/07/18 0335 05/10/18 0854 05/11/18 1453 05/12/18 0542  AST  31 35 33 24 19  ALT 21 20 22 20 17   ALKPHOS 131* 117 120 123 111  BILITOT 0.5 0.5 0.6 0.7 0.8  PROT 5.3* 4.6* 4.7* 5.2* 4.6*  ALBUMIN 2.1* 1.8* 2.0* 2.1* 1.8*   Recent Labs  Lab 05/06/18 1741  LIPASE 22   Recent Labs  Lab 05/08/18 1614 05/10/18 0854  AMMONIA 21 20   Coagulation Profile: No results for input(s): INR, PROTIME in the last 168 hours. Cardiac Enzymes: No results for input(s): CKTOTAL, CKMB, CKMBINDEX, TROPONINI in the last 168 hours. BNP (last 3 results) No results for input(s): PROBNP in the last 8760 hours. HbA1C: No results for input(s): HGBA1C in the last 72 hours. CBG: Recent Labs  Lab 05/11/18 1151 05/11/18 1706 05/11/18 2204 05/12/18 0757 05/12/18 1223  GLUCAP 136* 94 118* 93 124*   Lipid Profile: No results for input(s): CHOL, HDL, LDLCALC, TRIG, CHOLHDL, LDLDIRECT in the last 72 hours. Thyroid Function Tests: No results for input(s): TSH, T4TOTAL,  FREET4, T3FREE, THYROIDAB in the last 72 hours. Anemia Panel: Recent Labs    05/12/18 1355  VITAMINB12 954*  FERRITIN 319*  TIBC 108*  IRON 77  RETICCTPCT 1.7   Sepsis Labs: No results for input(s): PROCALCITON, LATICACIDVEN in the last 168 hours.  Recent Results (from the past 240 hour(s))  MRSA PCR Screening     Status: Abnormal   Collection Time: 05/07/18  3:44 AM  Result Value Ref Range Status   MRSA by PCR POSITIVE (Emireth Cockerham) NEGATIVE Final    Comment:        The GeneXpert MRSA Assay (FDA approved for NASAL specimens only), is one component of Daundre Biel comprehensive MRSA colonization surveillance program. It is not intended to diagnose MRSA infection nor to guide or monitor treatment for MRSA infections. RESULT CALLED TO, READ BACK BY AND VERIFIED WITH: 05/09/18 RN 05/07/18 05/09/18 JDW Performed at [facility], [address], [place], [place]          Radiology Studies: Dg Abd 1 View  Result Date: 05/11/2018 CLINICAL DATA:  Abdominal pain and altered mental status. Nausea and weakness with bloody stools. EXAM: ABDOMEN - 1 VIEW COMPARISON:  05/06/2018 and CT 11/22/2017 FINDINGS: Bowel gas pattern is nonobstructive. There is mild fecal retention throughout the colon. No free peritoneal air. There are surgical clips over the right upper quadrant. Spinal stabilization hardware is partially visualized over the lower thoracic spine. There mild degenerate changes of the spine. Stable compression fractures over the lower thoracic and lumbar spine. IMPRESSION: Nonobstructive bowel gas pattern with mild fecal retention throughout the colon. Stable spinal compression fractures. Electronically Signed   By: 01/20/2018 M.D.   On: 05/11/2018 09:30        Scheduled Meds: . sodium chloride   Intravenous Once  . allopurinol  300 mg Oral Daily  . atorvastatin  10 mg Oral Daily  . docusate sodium  100 mg Oral Daily  . famotidine  20 mg Oral BID  . feeding supplement  (GLUCERNA SHAKE)  237 mL Oral TID BM  . ferrous sulfate  325 mg Oral Q breakfast  . folic acid  1 mg Oral Daily  . gabapentin  300 mg Oral BID  . levETIRAcetam  500 mg Oral BID  . lidocaine  1 patch Transdermal Q24H  . metoprolol succinate  25 mg Oral Daily  . mometasone-formoterol  2 puff Inhalation BID  . multivitamin with minerals  1 tablet Oral Daily  . pantoprazole  80 mg Oral Daily  . polyethylene glycol  17 g Oral BID   Continuous Infusions: . sodium chloride Stopped (05/10/18 0509)     LOS: 4 days    Time spent: over 30 min    Lacretia Nicks, MD [facility] Pager [phone]   If 7PM-7AM, please contact night-coverage [email or web address] Password [facility] 05/12/2018, 3:15 PM

## 2018-05-12 NOTE — Progress Notes (Signed)
Notified Dr. Lowell Guitar, repeat H&H  6.7 7& 20.1

## 2018-05-12 NOTE — Progress Notes (Signed)
Pt wants something for sleep, NP on call was notified. Will continue to monitor.

## 2018-05-13 LAB — COMPREHENSIVE METABOLIC PANEL
ALT: 20 U/L (ref 14–54)
AST: 30 U/L (ref 15–41)
Albumin: 1.8 g/dL — ABNORMAL LOW (ref 3.5–5.0)
Alkaline Phosphatase: 111 U/L (ref 38–126)
Anion gap: 4 — ABNORMAL LOW (ref 5–15)
BUN: 15 mg/dL (ref 6–20)
CHLORIDE: 109 mmol/L (ref 101–111)
CO2: 23 mmol/L (ref 22–32)
CREATININE: 0.85 mg/dL (ref 0.44–1.00)
Calcium: 8.1 mg/dL — ABNORMAL LOW (ref 8.9–10.3)
GFR calc Af Amer: >60 mL/min (ref >60)
Glucose, Bld: 120 mg/dL — ABNORMAL HIGH (ref 65–99)
Potassium: 4.3 mmol/L (ref 3.5–5.1)
SODIUM: 136 mmol/L (ref 135–145)
Total Bilirubin: 0.8 mg/dL (ref 0.3–1.2)
Total Protein: 4.8 g/dL — ABNORMAL LOW (ref 6.5–8.1)

## 2018-05-13 LAB — CBC
HCT: 29.7 % — ABNORMAL LOW (ref 36.0–46.0)
HEMOGLOBIN: 10.2 g/dL — AB (ref 12.0–15.0)
MCH: 27.7 pg (ref 26.0–34.0)
MCHC: 34.3 g/dL (ref 30.0–36.0)
MCV: 80.7 fL (ref 78.0–100.0)
PLATELETS: 142 10*3/uL — AB (ref 150–400)
RBC: 3.68 MIL/uL — ABNORMAL LOW (ref 3.87–5.11)
RDW: 17.1 % — ABNORMAL HIGH (ref 11.5–15.5)
WBC: 10.5 10*3/uL (ref 4.0–10.5)

## 2018-05-13 LAB — DIFFERENTIAL
Abs Immature Granulocytes: 0.1 10*3/uL (ref 0.0–0.1)
Basophils Absolute: 0.1 10*3/uL (ref 0.0–0.1)
Basophils Relative: 1 %
EOS ABS: 0.2 10*3/uL (ref 0.0–0.7)
EOS PCT: 2 %
IMMATURE GRANULOCYTES: 1 %
LYMPHS ABS: 4.3 10*3/uL — AB (ref 0.7–4.0)
LYMPHS PCT: 39 %
MONO ABS: 1 10*3/uL (ref 0.1–1.0)
MONOS PCT: 9 %
Neutro Abs: 5.4 10*3/uL (ref 1.7–7.7)
Neutrophils Relative %: 48 %

## 2018-05-13 LAB — GLUCOSE, CAPILLARY
Glucose-Capillary: 144 mg/dL — ABNORMAL HIGH (ref 65–99)
Glucose-Capillary: 136 mg/dL — ABNORMAL HIGH (ref 65–99)

## 2018-05-13 LAB — HAPTOGLOBIN: Haptoglobin: <10 mg/dL — ABNORMAL LOW (ref 34–200)

## 2018-05-13 LAB — MAGNESIUM: Magnesium: 1.7 mg/dL (ref 1.7–2.4)

## 2018-05-13 MED ORDER — GABAPENTIN 300 MG PO CAPS: 300.0000 mg | ORAL_CAPSULE | Freq: Every day | ORAL | 0 refills | Status: DC

## 2018-05-13 MED ORDER — ADULT MULTIVITAMIN W/MINERALS CH: 1.0000 | ORAL_TABLET | Freq: Every day | ORAL | 0 refills | Status: AC

## 2018-05-13 MED ORDER — LEVETIRACETAM 500 MG PO TABS: 500.0000 mg | ORAL_TABLET | Freq: Two times a day (BID) | ORAL | 0 refills | Status: DC

## 2018-05-13 NOTE — Care Management Note (Signed)
Case Management Note  Patient Details  Name: Tameka Hoiland MRN: [id number] Date of Birth: 05/20/1956  Subjective/Objective:   Admitted with abdominal pain, N & V. Owns walker, 3 in 1/BSC , hospital bed. Family home with family.     Destanie Tibbetts ([address] Lianni Kanaan (Daughter)    [phone]     PCP: [name]  Action/Plan: Pt declined SNF placement. Pt will transition to home with home health services to follow. Pt states family to provide 24/7 supervision once discharges.  Pt will have transportation to home @ d/c.  Expected Discharge Date:    05/13/2018            Expected Discharge Plan:  Home w Home Health Services  In-House Referral:     Discharge planning Services  CM Consult  Post Acute Care Choice:    Choice offered to:  Patient  DME Arranged:   N/A DME Agency:   N/A  HH Arranged:  PT, RN, OT HH Agency:  [facility], pending MD's order. Orders requested from MD per NCM.  Status of Service:  Completed, signed off  If discussed at Long Length of Stay Meetings, dates discussed:    Additional Comments:  Epifanio Lesches, RN 05/13/2018, 10:50 AM

## 2018-05-13 NOTE — Progress Notes (Signed)
Pt given discharge instructions, prescriptions, and care notes via Ginger RN. Pt verbalized understanding AEB no further questions or concerns at this time. IV was discontinued, no redness, pain, or swelling noted at this time. Pt left the floor via wheelchair with staff in stable condition.

## 2018-05-13 NOTE — Progress Notes (Addendum)
Physical Therapy Treatment Patient Details Name: Amber [name] MRN: [id number] DOB: Mar 07, 1956 Today's Date: 05/13/2018    History of Present Illness Pt is a 62 y/o female admitted secondary to nausea/vomiting. Abdomen negative for acute abnormalities. PMH includes DM, HTN, a fib, chronic pancreatitis, CVA, seizures, and back surgery.     PT Comments    Patient is making progress toward PT goals and overall required min guard assist for safe OOB mobility. Pt will continue to benefit from further skilled PT services both acute and at home to maximize independence and safety with mobility.    Follow Up Recommendations  Home health PT;Supervision for mobility/OOB     Equipment Recommendations  None recommended by PT    Recommendations for Other Services       Precautions / Restrictions Precautions Precautions: Fall Restrictions Weight Bearing Restrictions: No    Mobility  Bed Mobility Overal bed mobility: Needs Assistance Bed Mobility: Supine to Sit     Supine to sit: Min guard     General bed mobility comments: min guard for safety; increased time and effort and use of rail  Transfers Overall transfer level: Needs assistance Equipment used: Rolling walker (2 wheeled) Transfers: Sit to/from Stand Sit to Stand: Min guard         General transfer comment: min guard for safety; pt demonstrated safe hand placement without cues  Ambulation/Gait Ambulation/Gait assistance: Min guard Gait Distance (Feet): 160 Feet Assistive device: Rolling walker (2 wheeled) Gait Pattern/deviations: Decreased stride length;Narrow base of support;Step-through pattern     General Gait Details: cues for posture; decreased cadence    Stairs             Wheelchair Mobility    Modified Rankin (Stroke Patients Only)       Balance Overall balance assessment: Needs assistance Sitting-balance support: No upper extremity supported;Feet supported Sitting balance-Leahy Scale:  Fair     Standing balance support: Bilateral upper extremity supported;During functional activity Standing balance-Leahy Scale: Poor                              Cognition Arousal/Alertness: Awake/alert Behavior During Therapy: WFL for tasks assessed/performed Overall Cognitive Status: Within Functional Limits for tasks assessed                                        Exercises      General Comments        Pertinent Vitals/Pain Pain Assessment: No/denies pain    Home Living                      Prior Function            PT Goals (current goals can now be found in the care plan section) Acute Rehab PT Goals Patient Stated Goal: to feel better  Progress towards PT goals: Progressing toward goals    Frequency    Min 3X/week      PT Plan Discharge plan needs to be updated    Co-evaluation              AM-PAC PT "6 Clicks" Daily Activity  Outcome Measure  Difficulty turning over in bed (including adjusting bedclothes, sheets and blankets)?: A Lot Difficulty moving from lying on back to sitting on the side of the bed? : Unable Difficulty sitting down on  and standing up from a chair with arms (e.g., wheelchair, bedside commode, etc,.)?: Unable Help needed moving to and from a bed to chair (including a wheelchair)?: A Little Help needed walking in hospital room?: A Little Help needed climbing 3-5 steps with a railing? : A Little 6 Click Score: 13    End of Session Equipment Utilized During Treatment: Gait belt Activity Tolerance: Patient tolerated treatment well Patient left: with call bell/phone within reach;in chair;with chair alarm set Nurse Communication: Mobility status PT Visit Diagnosis: Difficulty in walking, not elsewhere classified (R26.2);Muscle weakness (generalized) (M62.81);Unsteadiness on feet (R26.81)     Time: 9826-4158 PT Time Calculation (min) (ACUTE ONLY): 33 min  Charges:  $Gait Training: 8-22  mins $Therapeutic Activity: 8-22 mins                    G Codes:       Erline Levine, PTA Pager: [phone]     Carolynne Edouard 05/13/2018, 1:13 PM

## 2018-05-13 NOTE — Discharge Summary (Signed)
Physician Discharge Summary  Amber [name] CBU:[id number] DOB: 03-10-1956 DOA: 05/06/2018  PCP: Amber Real, MD  Admit date: 05/06/2018 Discharge date: 05/13/2018  Time spent: 40 minutes  Recommendations for Outpatient Follow-up:  1. Follow up outpatient CBC/CMP (attention to anemia) - received 1 unit pRBC here, but had greater than expected response to transfusion so suspect initial drop was spurious.  She's continuing eliquis on d/c.  2. Patient needs follow up regarding her medications as outpatient - some confusion about medications on her admission and she was able to clarify these when her acute encephalopathy improved, but would follow up to make sure all these meds continue to be appropriate for her 1. We continued her keppra for seizures and d/c'd her vimpat and depakene (she'd self discontinued these Amber [name] month or more ago given side effects) 2. Aspirin was d/c'd as she notes she was not on this since she's on eliquis 3. Prednisone was ultimately continued as she noted she'd been taking this for over 3 years for her asthma, consider outpatient taper if not needed 4. Metformin discontinued here, continue to monitor BG's as outpatient  3. Referred to neurology for follow up history of seizures   Discharge Diagnoses:  Principal Problem:   Nausea & vomiting Active Problems:   History of seizure   Diabetes mellitus type 2, controlled (HCC)   Essential hypertension   Hypomagnesemia   Abdominal pain   Drug-induced hyperglycemia   Chronic anemia   Moderate malnutrition (HCC)   PAF (paroxysmal atrial fibrillation) (HCC)   MRSA carrier   Acute metabolic encephalopathy   Metabolic acidosis   Mild persistent asthma   Discharge Condition: stable  Diet recommendation: heart healthy  Filed Weights   05/06/18 1723 05/07/18 0030  Weight: 47.6 kg (105 lb) 43.9 kg (96 lb 11.2 oz)    History of present illness:  Amber Goodsonis an 62 y.o.femalewith Amber [name] PMH of chronic pancreatitis who  was admitted 05/06/2018 with Amber [name] chief complaint of abdominal pain associated with nausea and vomiting.  Her abdominal pain, nausea, and vomiting was suspected to be related to chronic pancreatitis.  She had outside imaging on 6/14 which was essentially unremarkable (it showed evidence of chronic pancreatitis, diverticulosis, and chronic compression fractures - see report in care everywhere).  Her hospitalization was complicated by acute encephalopathy.  She had MRI which was negative for acute infarct and EEG which was consistent with moderate generalized cerebral dysfunction and showed evidence of metabolic encephalopathy.  She improved when her antiepileptics were discontinued (Amber [name] bit unclear, but it sounds like vimpat and depakene were d/c'd, but she's still taking keppra).  On the day of discharge she was much improved, not complaining of abdominal pain.    See below for additional details  Hospital Course:  Acute metabolic encephalopathy Noted to be somnolent 05/08/2018 with associated myoclonic jerking.  At this point in time, thought to be related to medications  She's much improved at the time of discharge, alert and oriented with nonfocal exam Amber [name] is supposed to bring med list but hasn't done this yet, though sister clarified some meds with pharmacy on 6/21.  She clarified her medications on 6/23.  She notes that she's still on keppra, but vimpat and depakene were discontinued (on discussion with daughter, it sounds like she stopped the vimpat several months ago and stopped the depakene over 1 month ago).  She notes she's on gabapentin (wasn't clear about the dose) and also that she is still taking xanaflex.   EEG (with moderate  generalized cerebral dysfunction - finding c/w metabolic encephalopathy), MRI (without acute infarct) Referral placed to neurology on discharge  Acute on Chronic anemia Drop to 6.7 from 8.3 yesterday Had increase to ~10 after 1 unit pRBC (more than I'd expect,  suspect anemia may have been spurious?) Hemodynamically stable Negative hemoccult Resume eliquis.  Stop aspirin (she said this was previously d/c'd). Transfused 1 unit pRBC Discussed risk/benefits of transfusion.  Pt agreeable to transfusion. Labs c/w anemia of chronic disease  Low haptoglobin, but this is isolated.  Not clear significance, especially in setting of robust response to 1 unit pRBC and normal LDH and bili and hypoproliferative retic.  Epigastric abdominal pain/nausea &vomiting Known history of chronic pancreatitis. CT of the abdomen and pelvis from outside facility performed 05/03/2018 and did not show any acute abnormalities. Lipase/LFTs unremarkable. Nausea and vomiting improved with supportive care.  - improved on day of discharge  Hypomagnesemia Follow repeat, improved  Diabetes mellitus type 2, controlled (HCC)  hypoglycemia with chronic kidney disease renal complications without long-term use of insulin Managed with metformin at home.  Had [name] low glucose of 58 on 05/07/2018, so SSI was discontinued. Low BG 6/22 again to 65 (pt did not eat dinner past night) Current hemoglobin A1c 5.7%.  Metformin was discontinued  Diabetes coordinator recommends discharge home off medications.  Gabapentin restarted on discharge  NAGMA: improved  Essential hypertension Continue metop, improved on day of discharge, follow outpatient  History of seizure disorder (HCC) Restart keppra (she says she is on this) Neurology referral  Moderate malnutrition (HCC) Continue supplementation per dietitian recommendations. Body mass index is 18.89 kg/m.  PAF (paroxysmal atrial fibrillation) (HCC) Controlled on metoprolol.  Restart apixiban. Discontinue aspirin as she says she's not taking this  AKI  stage III chronic kidney disease  metabolic acidosis Creatinine improved.   MRSA carrier Was maintained on contact isolation.  Mild persistent  asthmain acurrent non-smoker No wheezing, currently controlled. Continue Dulera and as needed bronchodilators. Currently smoking. States she's been on prednisone for 3 years for asthma.  Need to follow up with PCP regarding long term plan, taper.  Hyperkalemia: improved  Procedures: Technical Digital EEG recording using 10-20 international electrode system  Reason for the study Altered mental status  Description of the recording When awake posterior dominant rhythmis 6-7 bilateralreactive,  EEG comprised of generalized delta and sometimes rhythmic (GRD)intermixed with generalized triphasic waves with frontal predominance. Sleep architecture was not seen. Epileptiform features were not seen during this recording.  Impression The EEG is abnormal and findings are consistent with moderategeneralized cerebral dysfunction.  Certain findings observed can be seen in metabolic encephalopathy. Epileptiform features were not seen during this recording.  Consultations:  none  Discharge Exam: Vitals:   05/13/18 0551 05/13/18 1353  BP: 101/88 114/90  Pulse: 84 (!) 54  Resp: 16 17  Temp: 97.8 F (36.6 C) 99.6 F (37.6 C)  SpO2: 97% 94%   Ready to go home.  No complaints. Discussed with daughter on day of discharge.  General: No acute distress. Cardiovascular: Heart sounds show Brenn Deziel regular rate, and rhythm. No gallops or rubs. No murmurs. No JVD. Lungs: Clear to auscultation bilaterally with good air movement. No rales, rhonchi or wheezes. Abdomen: Soft, nontender, nondistended with normal active bowel sounds. No masses. No hepatosplenomegaly. Neurological: Alert and oriented 3. Moves all extremities 4. Cranial nerves II through XII grossly intact. Skin: Warm and dry. No rashes or lesions. Extremities: No clubbing or cyanosis. No edema.  Psychiatric: Mood and affect  are normal. Insight and judgment are appropriate.   Discharge Instructions   Discharge Instructions     Ambulatory referral to Neurology   Complete by:  As directed    An appointment is requested in approximately: 2 weeks, history of seizures   Call MD for:  difficulty breathing, headache or visual disturbances   Complete by:  As directed    Call MD for:  extreme fatigue   Complete by:  As directed    Call MD for:  persistant dizziness or light-headedness   Complete by:  As directed    Call MD for:  persistant nausea and vomiting   Complete by:  As directed    Call MD for:  redness, tenderness, or signs of infection (pain, swelling, redness, odor or green/yellow discharge around incision site)   Complete by:  As directed    Call MD for:  severe uncontrolled pain   Complete by:  As directed    Call MD for:  temperature >100.4   Complete by:  As directed    Diet - low sodium heart healthy   Complete by:  As directed    Discharge instructions   Complete by:  As directed    You were seen for abdominal pain.  This gradually improved.  We think it may be related to your chronic pancreatitis.  You got confused during the admission due to your medications.  We stopped your vimpat and depakene, which you said you were no longer taking and continued your keppra.  I placed Shirleen Mcfaul referral to neurology for you.  Please follow up with your primary care provider and neurology to see if these medications need to be resumed or changed.  Please follow up whether or not you need your prednisone long term.  We stopped it here in the hospital for Calvina Liptak while and it may need to be stopped long term.  Please follow up with your primary care doctor regarding this.  We also stopped her metformin, please follow up with your primary care doctor about this as well and follow your blood sugars.  Follow up for repeat labs with your doctor in the next few days.  You're taking gabapentin and muscle relaxers.  These can cause confusion.  If you notice confusion when restarting these, please stop these medicines and follow up  with your primary doctor.  Please follow up with your PCP this week.    Return if you have new, recurrent, or worsening symptoms.  Please ask your PCP to request records from this hospitalization so they know what was done and what the next steps will be.   Increase activity slowly   Complete by:  As directed      Allergies as of 05/13/2018      Reactions   Ivp Dye [iodinated Diagnostic Agents] Itching   Metrizamide Itching      Medication List    STOP taking these medications   aspirin 325 MG EC tablet   Lacosamide 100 MG Tabs   metFORMIN 500 MG 24 hr tablet Commonly known as:  GLUCOPHAGE-XR   Potassium Chloride ER 20 MEQ Tbcr     TAKE these medications   albuterol 108 (90 Base) MCG/ACT inhaler Commonly known as:  PROVENTIL HFA;VENTOLIN HFA Inhale 2 puffs into the lungs every 6 (six) hours as needed for wheezing or shortness of breath.   allopurinol 300 MG tablet Commonly known as:  ZYLOPRIM Take 300 mg by mouth daily.   amLODipine 5 MG tablet Commonly  known as:  NORVASC Take 5 mg by mouth daily.   atorvastatin 10 MG tablet Commonly known as:  LIPITOR Take 10 mg by mouth daily.   ELIQUIS 5 MG Tabs tablet Generic drug:  apixaban Take 5 mg by mouth 2 (two) times daily.   esomeprazole 40 MG capsule Commonly known as:  NEXIUM Take 40 mg by mouth daily.   feeding supplement (ENSURE ENLIVE) Liqd Take 237 mLs by mouth 2 (two) times daily between meals.   ferrous sulfate 325 (65 FE) MG tablet Take 1 tablet (325 mg total) by mouth daily with breakfast.   Fluticasone-Salmeterol 250-50 MCG/DOSE Aepb Commonly known as:  ADVAIR Inhale 2 puffs into the lungs 2 (two) times daily.   folic acid 1 MG tablet Commonly known as:  FOLVITE Take 1 mg by mouth daily.   gabapentin 300 MG capsule Commonly known as:  NEURONTIN Take 1 capsule (300 mg total) by mouth at bedtime.   levETIRAcetam 500 MG tablet Commonly known as:  KEPPRA Take 1 tablet (500 mg total) by mouth  2 (two) times daily.   metoprolol succinate 25 MG 24 hr tablet Commonly known as:  TOPROL-XL Take 25 mg by mouth daily.   multivitamin with minerals Tabs tablet Take 1 tablet by mouth daily. Start taking on:  05/14/2018   oxyCODONE 5 MG immediate release tablet Commonly known as:  Oxy IR/ROXICODONE Take 1-2 tablets (5-10 mg total) by mouth every 6 (six) hours as needed for moderate pain or severe pain. What changed:    how much to take  when to take this   predniSONE 10 MG tablet Commonly known as:  DELTASONE Take 1.5 tablets (15 mg total) by mouth daily with breakfast.   senna-docusate 8.6-50 MG tablet Commonly known as:  Senokot-S Take 2 tablets by mouth 2 (two) times daily. What changed:    when to take this  reasons to take this   tiZANidine 4 MG tablet Commonly known as:  ZANAFLEX Take 1 tablet (4 mg total) by mouth 4 (four) times daily as needed for muscle spasms.   torsemide 10 MG tablet Commonly known as:  DEMADEX Take 10 mg by mouth daily.   Vitamin D (Ergocalciferol) 50000 units Caps capsule Commonly known as:  DRISDOL Take 1 capsule (50,000 Units total) by mouth every 7 (seven) days. Sunday What changed:    when to take this  additional instructions      Allergies  Allergen Reactions  . Ivp Dye [Iodinated Diagnostic Agents] Itching  . Metrizamide Itching   Follow-up Information    [facility] Follow up.   Specialty:  Home Health Services Why:  home health services arranged Contact information: [address] [place] [address] [phone]        Amber Real, MD Follow up.   Specialty:  Internal Medicine Why:  Call for follow up appointment this week Contact information: [address] [place] [address] [phone]        [facility] Follow up.   Why:  You should get Sandhya Denherder phone call to schedule an appointment.  If you don't hear from anyone within 1 week, please call for an  appointment Contact information: [address]     [address] [place] [address] [phone]           The results of significant diagnostics from this hospitalization (including imaging, microbiology, ancillary and laboratory) are listed below for reference.    Significant Diagnostic Studies:  Dg Abd 1 View  Result Date: 05/11/2018 CLINICAL DATA:  Abdominal pain and altered mental status. Nausea and weakness with bloody stools. EXAM: ABDOMEN - 1 VIEW COMPARISON:  05/06/2018 and CT 11/22/2017 FINDINGS: Bowel gas pattern is nonobstructive. There is mild fecal retention throughout the colon. No free peritoneal air. There are surgical clips over the right upper quadrant. Spinal stabilization hardware is partially visualized over the lower thoracic spine. There mild degenerate changes of the spine. Stable compression fractures over the lower thoracic and lumbar spine. IMPRESSION: Nonobstructive bowel gas pattern with mild fecal retention throughout the colon. Stable spinal compression fractures. Electronically Signed   By: Elberta Fortis M.D.   On: 05/11/2018 09:30   Mr Brain Wo Contrast  Result Date: 05/10/2018 CLINICAL DATA:  Altered level of consciousness. EXAM: MRI HEAD WITHOUT CONTRAST TECHNIQUE: Multiplanar, multiecho pulse sequences of the brain and surrounding structures were obtained without intravenous contrast. COMPARISON:  CT 10/20/2017.  MRI head 10/26/2016 FINDINGS: Brain: Negative for acute infarct. Chronic microvascular ischemic change in the white matter and basal ganglia. 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 Vascular: Normal arterial flow voids. Skull and upper cervical spine: Anterolisthesis C3-4. Retrolisthesis C5-6. This appears degenerative. Posterior hardware fusion in the lower cervical spine. No skull lesion. Sinuses/Orbits: Negative Other: Image quality degraded by motion  IMPRESSION: Negative for acute infarct.  Chronic ischemic changes diffusely. Electronically Signed   By: Marlan Palau M.D.   On: 05/10/2018 13:22   Dg Abd Acute W/chest  Result Date: 05/06/2018 CLINICAL DATA:  Acute generalized abdominal pain, vomiting. EXAM: DG ABDOMEN ACUTE W/ 1V CHEST COMPARISON:  Radiograph of November 22, 2017. FINDINGS: There is no evidence of dilated bowel loops or free intraperitoneal air. Status post cholecystectomy. Atherosclerosis of thoracic aorta is noted. Heart size and mediastinal contours are within normal limits. Both lungs are clear. IMPRESSION: No evidence of bowel obstruction or ileus. No acute cardiopulmonary disease. Aortic Atherosclerosis (ICD10-I70.0). Electronically Signed   By: Lupita Raider, M.D.   On: 05/06/2018 21:52    Microbiology: Recent Results (from the past 240 hour(s))  MRSA PCR Screening     Status: Abnormal   Collection Time: 05/07/18  3:44 AM  Result Value Ref Range Status   MRSA by PCR POSITIVE (Dorthea Maina) NEGATIVE Final    Comment:        The GeneXpert MRSA Assay (FDA approved for NASAL specimens only), is one component of Kayshaun Polanco comprehensive MRSA colonization surveillance program. It is not intended to diagnose MRSA infection nor to guide or monitor treatment for MRSA infections. RESULT CALLED TO, READ BACK BY AND VERIFIED WITH: Maximino Greenland RN 05/07/18 7654 JDW Performed at [facility], [address] [place], [place] [address]      Labs: Basic Metabolic Panel: Recent Labs  Lab 05/08/18 1614 05/09/18 0239 05/10/18 0854 05/11/18 1453 05/12/18 0542 05/13/18 0542  NA  --  144 142 134* 136 136  K  --  3.7 5.3* 4.5 4.7 4.3  CL  --  119* 116* 107 110 109  CO2  --  16* 20* 21* 22 23  GLUCOSE  --  97 80 108* 94 120*  BUN  --  10 14 16 19 15   CREATININE  --  0.90 0.99 0.96 1.06* 0.85  CALCIUM  --  8.0* 8.4* 8.1* 8.0* 8.1*  MG 1.2*  --  1.9 1.8 1.8 1.7   Liver Function Tests: Recent Labs  Lab 05/07/18 0335 05/10/18 0854  05/11/18 1453 05/12/18 0542 05/13/18 0542  AST 35 33 24 19 30   ALT 20 22 20 17 20   ALKPHOS 117 120 123 111 111  BILITOT 0.5 0.6 0.7 0.8 0.8  PROT 4.6* 4.7* 5.2* 4.6* 4.8*  ALBUMIN 1.8* 2.0* 2.1* 1.8* 1.8*   Recent Labs  Lab 05/06/18 1741  LIPASE 22   Recent Labs  Lab 05/08/18 1614 05/10/18 0854  AMMONIA 21 20   CBC: Recent Labs  Lab 05/07/18 0335 05/10/18 0854 05/11/18 1453 05/12/18 0542 05/12/18 1133 05/12/18 2220 05/13/18 0542  WBC 5.7 10.0 7.9 8.3  --   --  10.5  HGB 8.1* 8.1* 8.3* 6.9* 6.7* 9.7* 10.2*  HCT 25.6* 25.0* 25.0* 20.7* 20.1* 28.2* 29.7*  MCV 79.0 77.6* 75.5* 75.5*  --   --  80.7  PLT 175 180 169 144*  --   --  142*   Cardiac Enzymes: No results for input(s): CKTOTAL, CKMB, CKMBINDEX, TROPONINI in the last 168 hours. BNP: BNP (last 3 results) Recent Labs    11/18/17 1800  BNP 128.9*    ProBNP (last 3 results) No results for input(s): PROBNP in the last 8760 hours.  CBG: Recent Labs  Lab 05/12/18 1223 05/12/18 1706 05/12/18 2133 05/13/18 0757 05/13/18 1215  GLUCAP 124* 121* 151* 144* 136*       Signed:  05/15/18 MD.  [facility] 05/13/2018, 4:02 PM

## 2018-05-14 LAB — BPAM RBC
BLOOD PRODUCT EXPIRATION DATE: 201907222359
BLOOD PRODUCT EXPIRATION DATE: 201907222359
Blood Product Expiration Date: 201907222359
ISSUE DATE / TIME: 201906231550
Unit Type and Rh: 5100
Unit Type and Rh: 5100
Unit Type and Rh: 5100

## 2018-05-14 LAB — TYPE AND SCREEN
ABO/RH(D): O POS
ANTIBODY SCREEN: NEGATIVE
UNIT DIVISION: 0
Unit division: 0
Unit division: 0

## 2018-05-16 LAB — LEVETIRACETAM LEVEL: LEVETIRACETAM: 39 ug/mL (ref 10.0–40.0)

## 2018-06-22 IMAGING — DX DG ABDOMEN ACUTE W/ 1V CHEST
3 series · 3 of 3 positions shown · non-contrast
Comparison: Radiograph of November 22, 2017.

CLINICAL DATA: Acute generalized abdominal pain, vomiting.

EXAM:
DG ABDOMEN ACUTE W/ 1V CHEST

[abdomen erect]
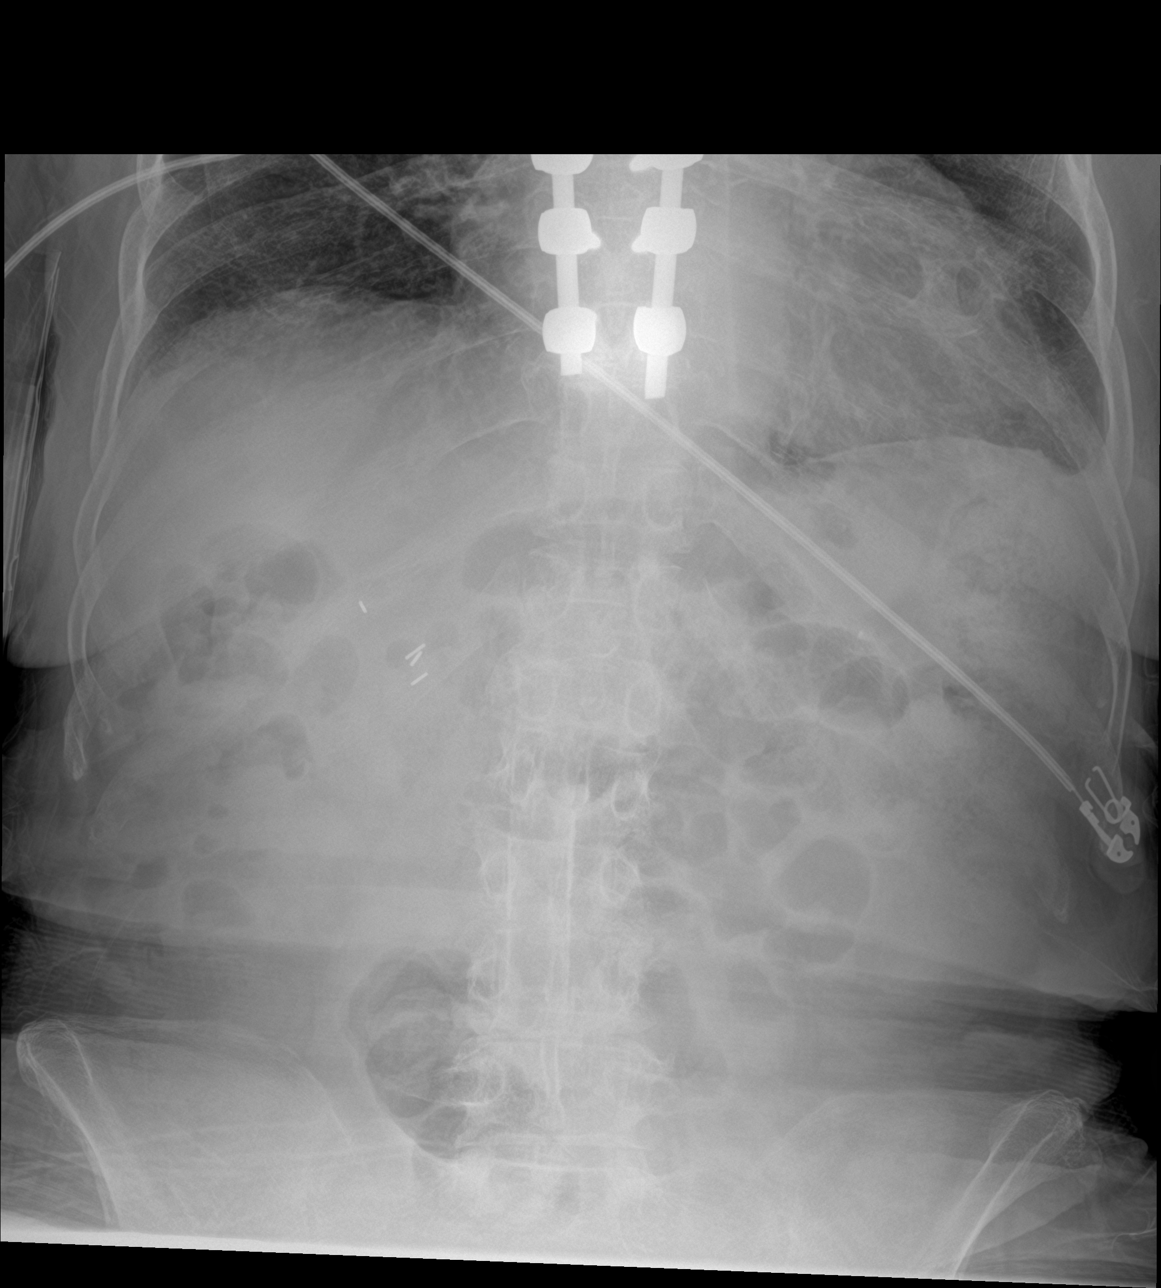

[abdomen supine]
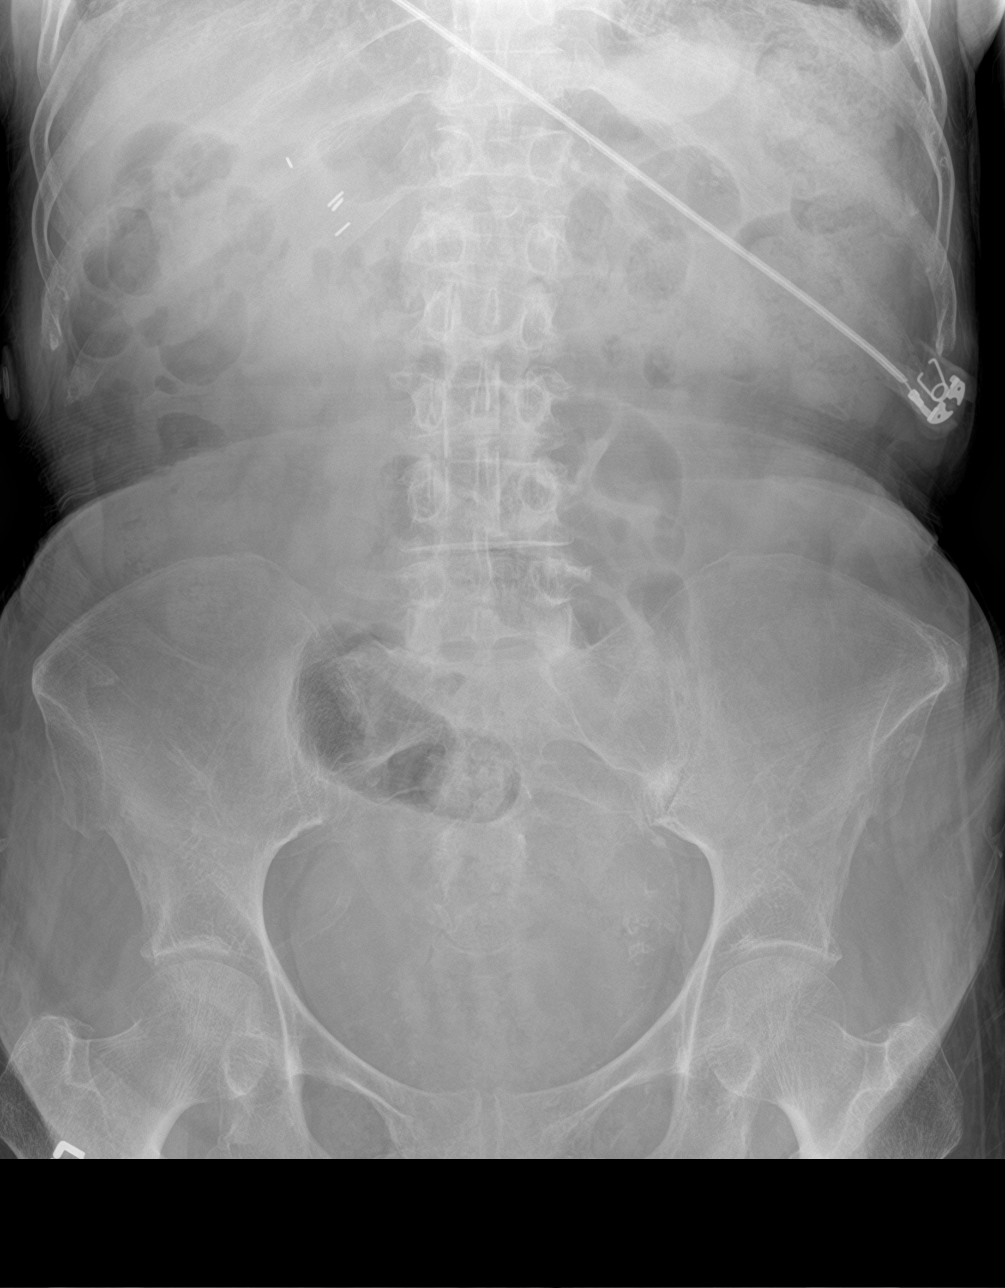

[chest ap]
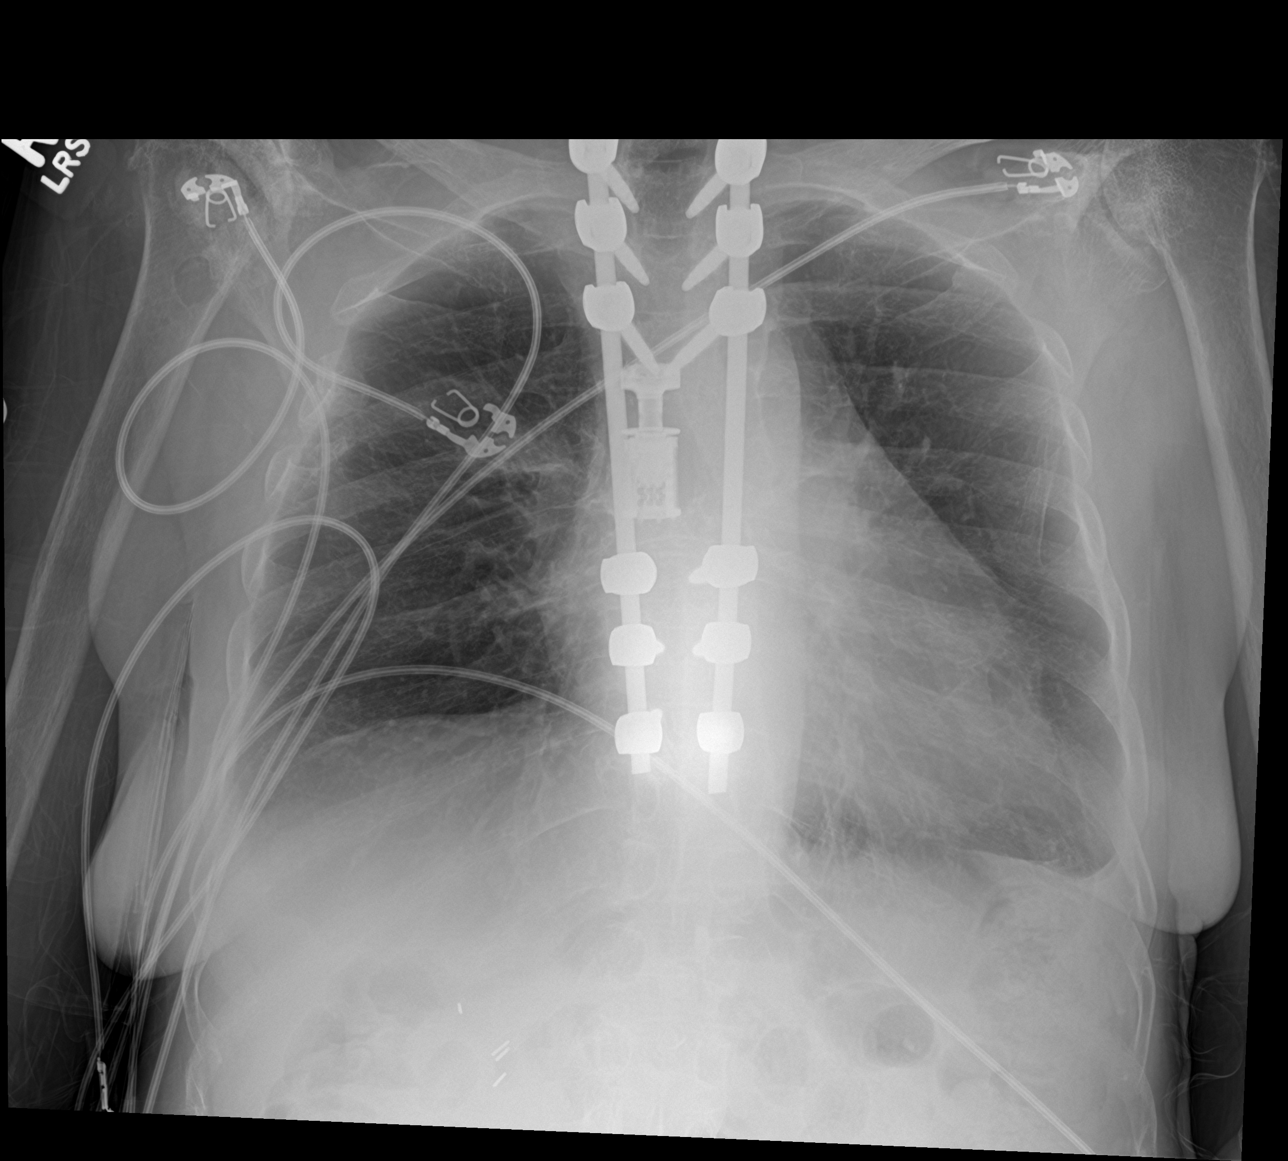

[3 of 3 positions shown; findings below may reference images not displayed]

FINDINGS: There is no evidence of dilated bowel loops or free intraperitoneal
air. Status post cholecystectomy. Atherosclerosis of thoracic aorta
is noted. Heart size and mediastinal contours are within normal
limits. Both lungs are clear.
IMPRESSION: No evidence of bowel obstruction or ileus. No acute cardiopulmonary
disease.

Aortic Atherosclerosis (S49AZ-I46.6).

## 2018-06-26 IMAGING — MR MR HEAD W/O CM
11 of 12 series · 43 of 48 positions shown · non-contrast
Comparison: CT 10/20/2017.  MRI head 10/26/2016

CLINICAL DATA: Altered level of consciousness.

EXAM:
MRI HEAD WITHOUT CONTRAST
TECHNIQUE: Multiplanar, multiecho pulse sequences of the brain and surrounding
structures were obtained without intravenous contrast.

[Series 5: ax dwi_tracew · axial · 3.0mm · 1.50mm/px · z∈[-29,+99]mm · 8 of 80 slices shown]
[im 1/80]
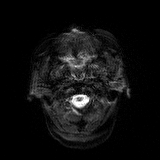
[im 12/80]
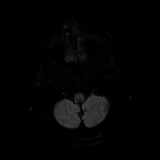
[im 23/80]
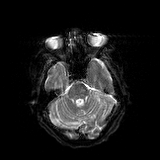
[im 34/80]
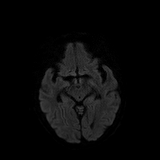
[im 46/80]
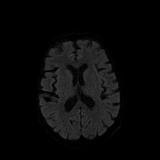
[im 57/80]
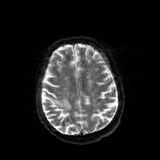
[im 68/80]
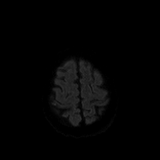
[im 80/80]
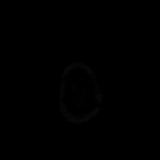

[Series 6: ax dwi_adc · axial · 3.0mm · 1.50mm/px · z∈[-29,+99]mm · 4 of 40 slices shown]
[im 1/40]
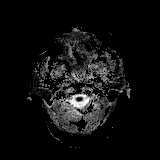
[im 14/40]
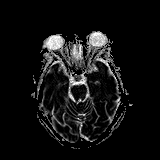
[im 27/40]
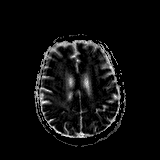
[im 40/40]
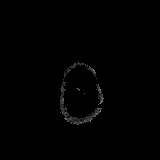

[Series 7: cor dwi_tracew · coronal · 5.0mm · 1.44mm/px · 7 of 64 slices shown]
[im 1/64]
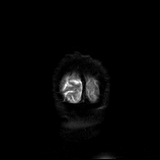
[im 11/64]
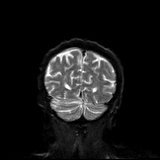
[im 22/64]
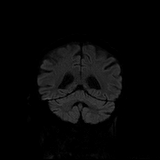
[im 32/64]
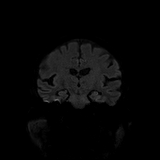
[im 43/64]
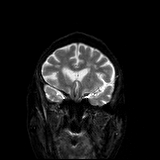
[im 53/64]
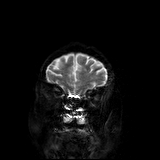
[im 64/64]
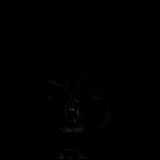

[Series 8: cor dwi_adc · coronal · 5.0mm · 1.44mm/px · 3 of 32 slices shown]
[im 1/32]
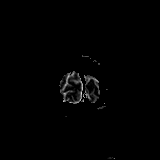
[im 16/32]
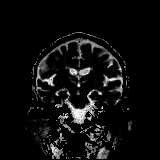
[im 32/32]
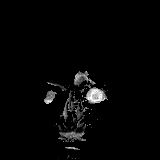

[Series 9: T1 · sagittal · 5.0mm · 0.75mm/px · 2 of 23 slices shown]
[im 1/23]
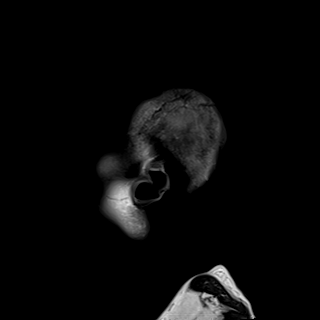
[im 23/23]
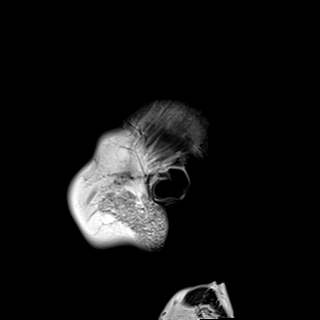

[Series 10: T2 · axial · 5.0mm · 0.69mm/px · z∈[-21,+109]mm · 2 of 23 slices shown (1 of 3)]
[im 1/23]
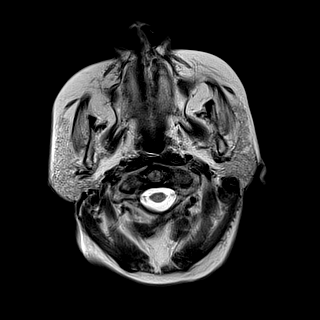
[im 23/23]
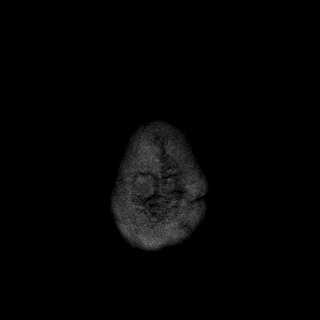

[Series 11: FLAIR · axial · 5.0mm · 0.43mm/px · z∈[-22,+108]mm · 2 of 23 slices shown]
[im 1/23]
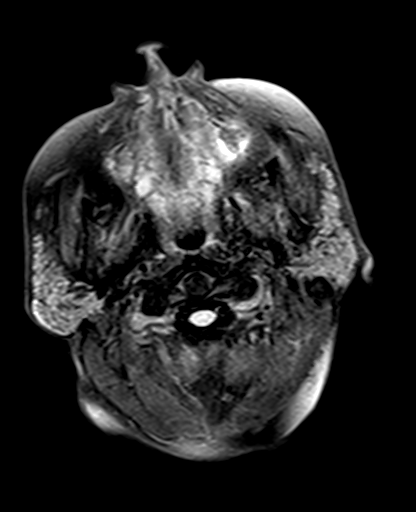
[im 23/23]
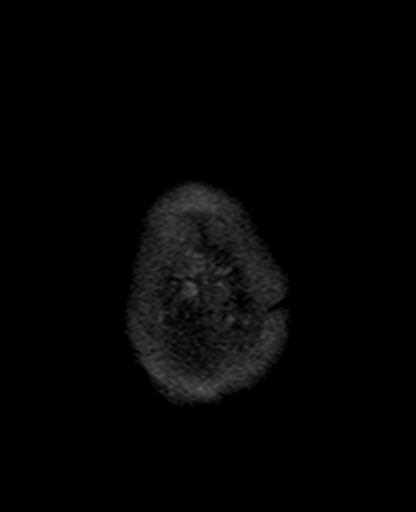

[Series 12: swi_images · axial · 3.0mm · 0.86mm/px · z∈[-20,+107]mm · 5 of 44 slices shown]
[im 1/44]
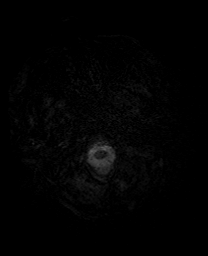
[im 11/44]
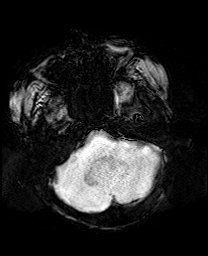
[im 22/44]
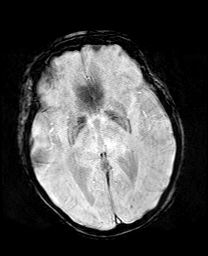
[im 33/44]
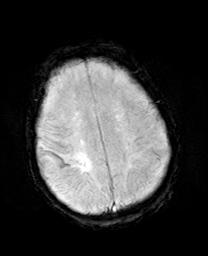
[im 44/44]
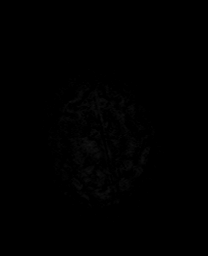

[Series 13: mip_images(sw) · axial · 24.0mm · 0.86mm/px · z∈[-10,+97]mm · 4 of 37 slices shown]
[im 1/37]
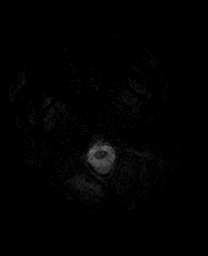
[im 13/37]
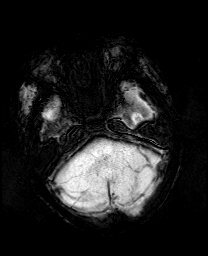
[im 25/37]
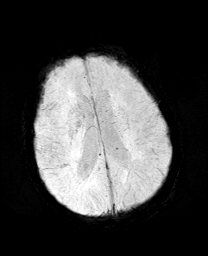
[im 37/37]
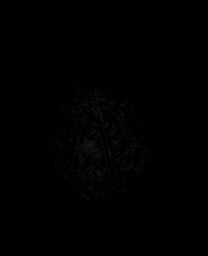

[Series 16: T2 · coronal · 3.0mm · 0.27mm/px · 3 of 26 slices shown (2 of 3)]
[im 1/26]
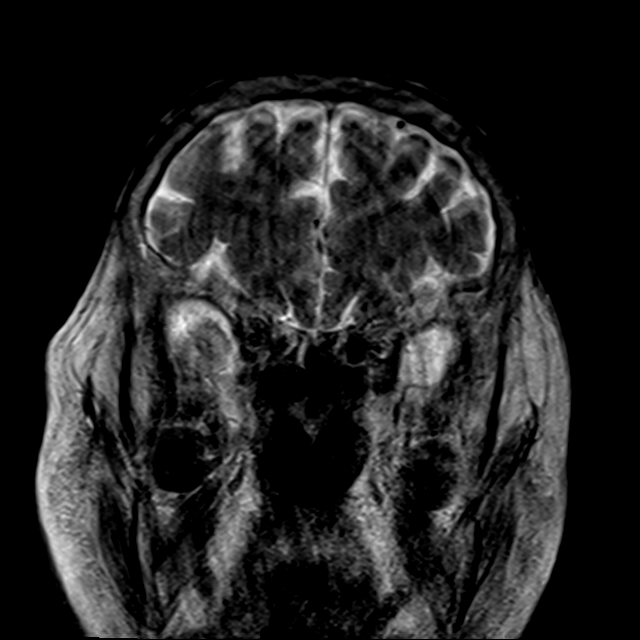
[im 13/26]
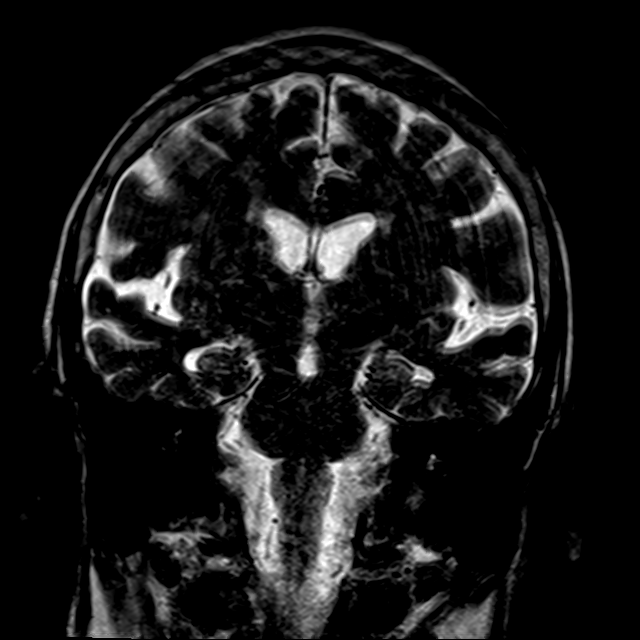
[im 26/26]
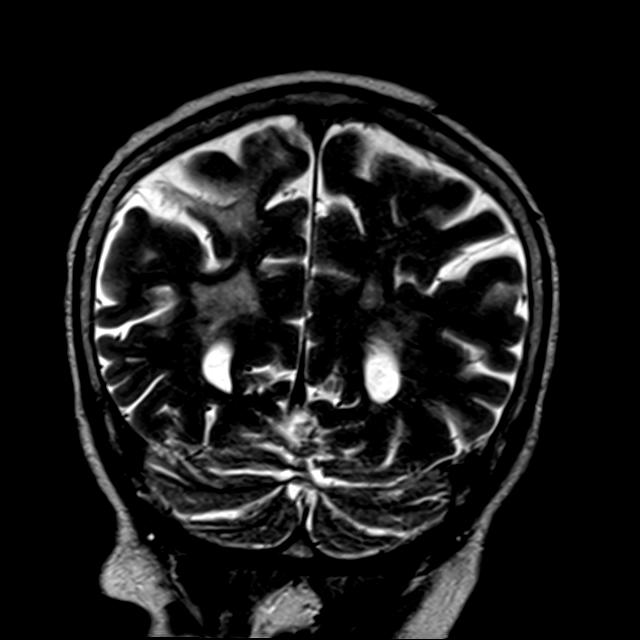

[Series 17: T2 · coronal · 5.0mm · 0.34mm/px · 3 of 27 slices shown (3 of 3)]
[im 1/27]
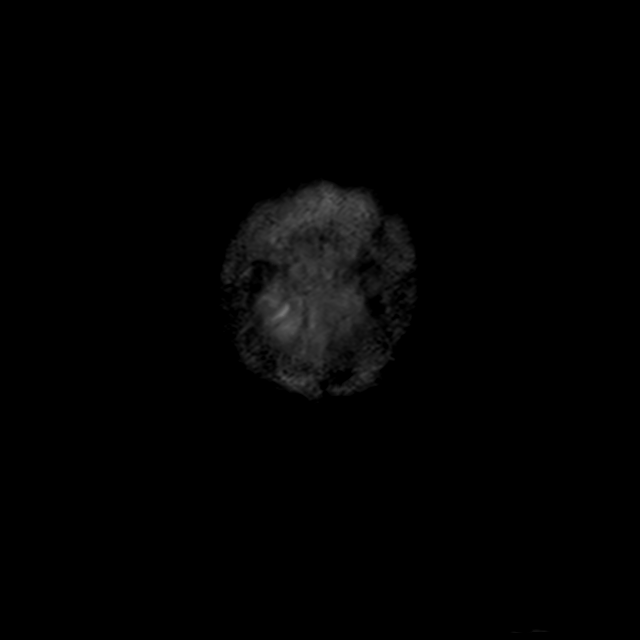
[im 14/27]
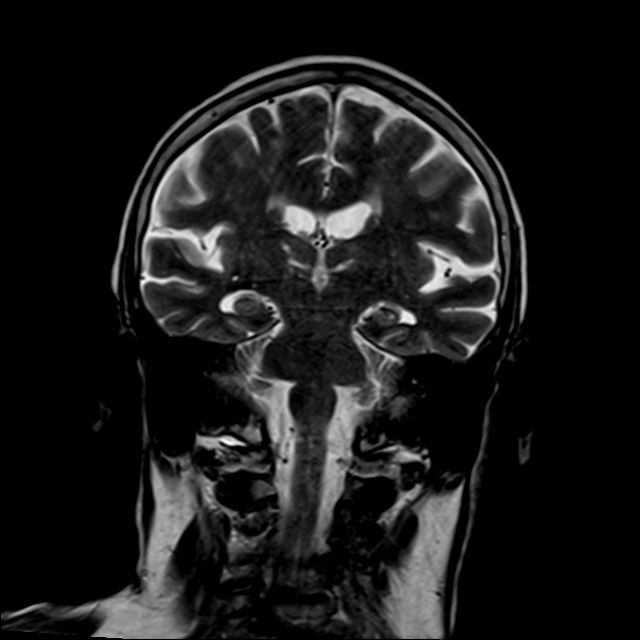
[im 27/27]
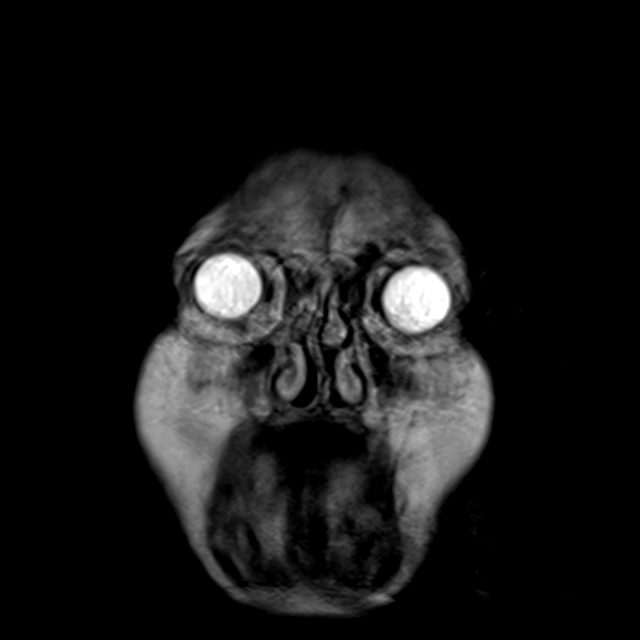

[43 of 48 positions shown; findings below may reference images not displayed]

FINDINGS: Brain: Negative for acute infarct.

Chronic microvascular ischemic change in the white matter and basal
ganglia. Multiple small chronic infarct left cerebellum. Chronic
hemorrhagic infarct in the region of the right motor strip.

Vascular: Normal arterial flow voids.

Skull and upper cervical spine: Anterolisthesis C3-4. Retrolisthesis
C5-6. This appears degenerative. Posterior hardware fusion in the
lower cervical spine. No skull lesion.

Sinuses/Orbits: Negative

Other: Image quality degraded by motion
IMPRESSION: Negative for acute infarct.  Chronic ischemic changes diffusely.

## 2018-06-27 IMAGING — DX DG ABDOMEN 1V
1 series · 1 of 1 positions shown · non-contrast
Comparison: 05/06/2018 and CT 11/22/2017

CLINICAL DATA: Abdominal pain and altered mental status. Nausea and
weakness with bloody stools.

EXAM:
ABDOMEN - 1 VIEW

[abdomen kub]
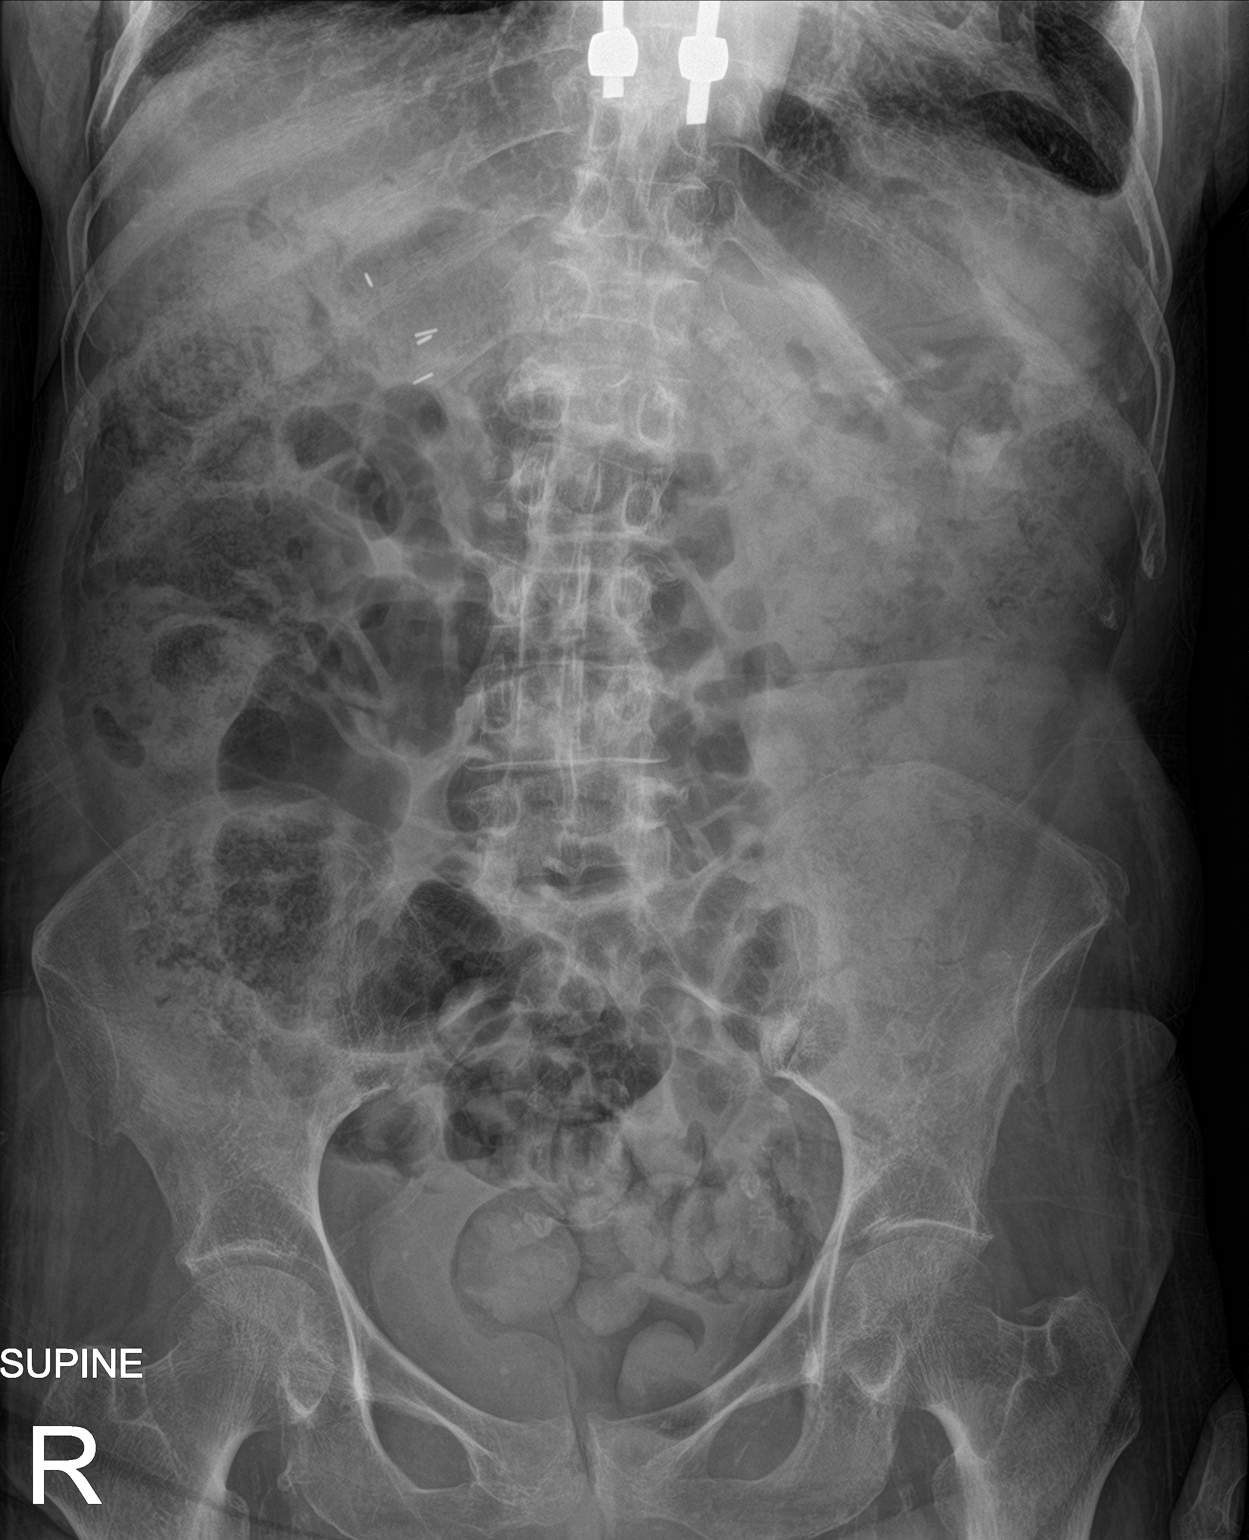

[1 of 1 positions shown; findings below may reference images not displayed]

FINDINGS: Bowel gas pattern is nonobstructive. There is mild fecal retention
throughout the colon. No free peritoneal air. There are surgical
clips over the right upper quadrant. Spinal stabilization hardware
is partially visualized over the lower thoracic spine. There mild
degenerate changes of the spine. Stable compression fractures over
the lower thoracic and lumbar spine.
IMPRESSION: Nonobstructive bowel gas pattern with mild fecal retention
throughout the colon.

Stable spinal compression fractures.

## 2018-07-31 ENCOUNTER — Emergency Department (HOSPITAL_COMMUNITY)
Admission: EM | Admit: 2018-07-31 | Discharge: 2018-07-31 | Disposition: A | Discharge status: Home / Self Care | Payer: Medicaid Other | Attending: Emergency Medicine | Admitting: Emergency Medicine

## 2018-07-31 ENCOUNTER — Other Ambulatory Visit: Payer: Self-pay

## 2018-07-31 DIAGNOSIS — M2550 Pain in unspecified joint: Secondary | ICD-10-CM | POA: Diagnosis not present

## 2018-07-31 LAB — COMPREHENSIVE METABOLIC PANEL
ALT: 12 U/L (ref 0–44)
AST: 24 U/L (ref 15–41)
Albumin: 1.5 g/dL — ABNORMAL LOW (ref 3.5–5.0)
Alkaline Phosphatase: 118 U/L (ref 38–126)
Anion gap: 11 (ref 5–15)
BUN: <5 mg/dL — ABNORMAL LOW (ref 6–20)
CO2: 20 mmol/L — ABNORMAL LOW (ref 22–32)
Calcium: 7.4 mg/dL — ABNORMAL LOW (ref 8.9–10.3)
Chloride: 105 mmol/L (ref 98–111)
Creatinine, Ser: 1.01 mg/dL — ABNORMAL HIGH (ref 0.44–1.00)
GFR calc Af Amer: >60 mL/min (ref >60)
GFR calc non Af Amer: 59 mL/min — ABNORMAL LOW (ref >60)
Glucose, Bld: 219 mg/dL — ABNORMAL HIGH (ref 70–99)
Potassium: 3.5 mmol/L (ref 3.5–5.1)
Sodium: 136 mmol/L (ref 135–145)
Total Bilirubin: 0.9 mg/dL (ref 0.3–1.2)
Total Protein: 4.7 g/dL — ABNORMAL LOW (ref 6.5–8.1)

## 2018-07-31 LAB — URINALYSIS, ROUTINE W REFLEX MICROSCOPIC
Bilirubin Urine: NEGATIVE
Glucose, UA: 50 mg/dL — AB
Hgb urine dipstick: NEGATIVE
Ketones, ur: NEGATIVE mg/dL
Leukocytes, UA: NEGATIVE
Nitrite: NEGATIVE
Protein, ur: NEGATIVE mg/dL
Specific Gravity, Urine: 1.014 (ref 1.005–1.030)
pH: 7 (ref 5.0–8.0)

## 2018-07-31 LAB — CBC WITH DIFFERENTIAL/PLATELET
Abs Immature Granulocytes: 0 10*3/uL (ref 0.0–0.1)
Basophils Absolute: 0 10*3/uL (ref 0.0–0.1)
Basophils Relative: 0 %
Eosinophils Absolute: 0 10*3/uL (ref 0.0–0.7)
Eosinophils Relative: 1 %
HCT: 23.1 % — ABNORMAL LOW (ref 36.0–46.0)
Hemoglobin: 7.8 g/dL — ABNORMAL LOW (ref 12.0–15.0)
Immature Granulocytes: 0 %
Lymphocytes Relative: 49 %
Lymphs Abs: 2.9 10*3/uL (ref 0.7–4.0)
MCH: 25.7 pg — ABNORMAL LOW (ref 26.0–34.0)
MCHC: 33.8 g/dL (ref 30.0–36.0)
MCV: 76.2 fL — ABNORMAL LOW (ref 78.0–100.0)
Monocytes Absolute: 1.1 10*3/uL — ABNORMAL HIGH (ref 0.1–1.0)
Monocytes Relative: 18 %
Neutro Abs: 1.9 10*3/uL (ref 1.7–7.7)
Neutrophils Relative %: 32 %
Platelets: 71 10*3/uL — ABNORMAL LOW (ref 150–400)
RBC: 3.03 MIL/uL — ABNORMAL LOW (ref 3.87–5.11)
RDW: 15.9 % — ABNORMAL HIGH (ref 11.5–15.5)
WBC: 5.9 10*3/uL (ref 4.0–10.5)

## 2018-07-31 LAB — CBG MONITORING, ED
GLUCOSE-CAPILLARY: 186 mg/dL — AB (ref 70–99)
GLUCOSE-CAPILLARY: 57 mg/dL — AB (ref 70–99)
GLUCOSE-CAPILLARY: 162 mg/dL — AB (ref 70–99)
Glucose-Capillary: 189 mg/dL — ABNORMAL HIGH (ref 70–99)

## 2018-07-31 MED ORDER — OXYCODONE HCL 20 MG PO TABS
20.0000 mg | ORAL_TABLET | Freq: Four times a day (QID) | ORAL | Status: DC
Start: 2018-07-31 — End: 2018-07-31

## 2018-07-31 MED ORDER — DEXTROSE 50 % IV SOLN
INTRAVENOUS | Status: AC
  Filled 2018-07-31: qty 50

## 2018-07-31 MED ORDER — OXYCODONE HCL 5 MG PO TABS
20.0000 mg | ORAL_TABLET | Freq: Once | ORAL | Status: AC
  Administered 2018-07-31: 20 mg via ORAL
  Filled 2018-07-31: qty 4

## 2018-07-31 MED ORDER — MORPHINE SULFATE (PF) 4 MG/ML IV SOLN
4.0000 mg | Freq: Once | INTRAVENOUS | Status: AC
  Administered 2018-07-31: 4 mg via INTRAVENOUS
  Filled 2018-07-31: qty 1

## 2018-07-31 MED ORDER — SODIUM CHLORIDE 0.9 % IV BOLUS
500.0000 mL | Freq: Once | INTRAVENOUS | Status: AC
  Administered 2018-07-31: 500 mL via INTRAVENOUS

## 2018-07-31 MED ORDER — DEXTROSE 50 % IV SOLN
1.0000 | Freq: Once | INTRAVENOUS | Status: AC
  Administered 2018-07-31: 50 mL via INTRAVENOUS

## 2018-07-31 MED ORDER — ONDANSETRON HCL 4 MG/2ML IJ SOLN: 4.0000 mg | Freq: Once | INTRAMUSCULAR | Status: DC

## 2018-07-31 MED ORDER — OXYCODONE HCL 20 MG PO TABS: 20.0000 mg | ORAL_TABLET | Freq: Four times a day (QID) | ORAL | 0 refills | Status: DC

## 2018-07-31 NOTE — ED Triage Notes (Signed)
Patients to the ED with complaints of generalized pain. States she has been out of pain medication for the last week. Patient states she started vomiting about 10 hours ago. States when go up this morning CBG was 44. EMS arrived CBG was 72. EMS states they gave oral glucose and CBG was 77. Patient alert and oriented. Crying during assessment stating having pain all over. Patient given 4mg  Zofran and of fentanyl l .

## 2018-07-31 NOTE — ED Provider Notes (Signed)
Medical screening examination/treatment/procedure(s) were conducted as a shared visit with non-physician practitioner(s) and myself.  I personally evaluated the patient during the encounter.  None  Patient seen by me along with the physician assistant.  Patient with a complaint of generalized pain.  States his been out of her pain medication that she takes for her arthritis for the past week.  She does not have to take it every day.  Her pain medication is oxycodone.  But stated that she got a lot of pain from her arthritis currently.  Patient also states she vomited about 10 hours ago.  When she got up this morning her blood sugar was 44 when EMS checked it it was 72.  EMS stated they had to give her oral glucose.  Patient was crying at the time due to pain all over.  Patient received 100 mcg of fentanyl as well as 4 mg Zofran by EMS.  Patient with extensive work-up here [place] seem to be that the main thing is that she needed pain control.  We tried various pain medicine did not seem to help much so eventually gave her a dose of oxycodone.  Which helped and patient improved enough for return home.  Patient was able to ambulate.    Vanetta Mulders, MD 07/31/18 [phone]

## 2018-07-31 NOTE — Discharge Instructions (Addendum)
Return here as needed.  Follow-up with your doctor as soon as possible. 

## 2018-07-31 NOTE — ED Provider Notes (Signed)
[facility] EMERGENCY DEPARTMENT Provider Note   CSN: [id number] Arrival date & time: 07/31/18  0553     History   Chief Complaint Chief Complaint  Patient presents with  . Generalized Body Aches  . Nausea  . Vomiting  . Hypoglycemia    HPI Amber [name] is a 62 y.o. female.  HPI Patient presents to the emergency department with joint pain that she feels is associated with her rheumatoid arthritis.  Patient states that she has a history of similar symptoms.  She has run out of her pain medication.  The patient states that she still had some nausea and vomiting that started after running out of the pain medications.  She states that her blood sugar seemed low this morning as well.  The patient denies chest pain, shortness of breath, headache,blurred vision, neck pain, fever, cough, weakness, numbness, dizziness, anorexia, edema, abdominal pain, diarrhea, rash, back pain, dysuria, hematemesis, bloody stool, near syncope, or syncope. Past Medical History:  Diagnosis Date  . ACS (acute coronary syndrome) (HCC) 06/12/2015  . Acute on chronic blood loss anemia 11/25/2017  . Arthritis   . Asthma   . 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 Chronic pain   . Constipation   . Diabetes mellitus without complication (HCC)   . Duodenitis   . Embolic stroke (HCC)   . Falls frequently   . Focal seizure (HCC)   . GERD (gastroesophageal reflux disease)   . Hypertension   . Impacted fracture 02/15/2017   right glenoid  . Pneumonia 10/2015  . Seizures (HCC)    last seizure March 2015  . Small bowel obstruction (HCC) 11/25/2017    Patient Active Problem List   Diagnosis Date Noted  . Acute metabolic encephalopathy 05/09/2018  . Metabolic acidosis 05/09/2018  . Mild persistent asthma 05/09/2018  . 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 Moderate malnutrition (HCC) 05/07/2018  . PAF (paroxysmal atrial fibrillation) (HCC) 05/07/2018  . Nausea & vomiting  05/06/2018  . Chronic anemia 05/06/2018  . PFO (patent foramen ovale) 02/15/2017  . History of CVA with residual deficit 02/15/2017  . Drug-induced hyperglycemia 02/15/2017  . 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 Abdominal pain 11/07/2015  . Constipation 11/07/2015  . Hypomagnesemia 08/25/2015  . Diabetes mellitus type 2, controlled (HCC) 07/27/2015  . Chronic back pain   . Frequent falls   . Essential hypertension   . Pulmonary hypertension (HCC)   . Gout   . Microcytic anemia 01/26/2015  . History of seizure 01/26/2015    Past Surgical History:  Procedure Laterality Date  . APPENDECTOMY    . BACK SURGERY    . CHOLECYSTECTOMY    . TEE WITHOUT CARDIOVERSION N/A 10/30/2016   Procedure: TRANSESOPHAGEAL ECHOCARDIOGRAM (TEE);  Surgeon: Lars Masson, MD;  Location: [facility] ENDOSCOPY;  Service: Cardiovascular;  Laterality: N/A;     OB History   None      Home Medications    Prior to Admission medications   Medication Sig Start Date End Date Taking? Authorizing Provider  albuterol (PROVENTIL HFA;VENTOLIN HFA) 108 (90 BASE) MCG/ACT inhaler Inhale 2 puffs into the lungs every 6 (six) hours as needed for wheezing or shortness of breath.    [provider]  allopurinol (ZYLOPRIM) 300 MG tablet Take 300 mg by mouth daily. 11/11/16   [provider]  amLODipine (NORVASC) 5 MG tablet Take 5 mg by mouth daily.    [provider]  apixaban Everlene Balls)  5 MG TABS tablet Take 5 mg by mouth 2 (two) times daily.    [provider]  atorvastatin (LIPITOR) 10 MG tablet Take 10 mg by mouth daily. 08/13/15   [provider]  esomeprazole (NEXIUM) 40 MG capsule Take 40 mg by mouth daily. 04/18/18   [provider]  feeding supplement, ENSURE ENLIVE, (ENSURE ENLIVE) LIQD Take 237 mLs by mouth 2 (two) times daily between meals. 11/10/15   Catarina Hartshorn, MD  ferrous sulfate 325 (65 FE) MG tablet Take 1 tablet (325 mg total)  by mouth daily with breakfast. 11/27/17   Rozann Lesches, MD  Fluticasone-Salmeterol (ADVAIR) 250-50 MCG/DOSE AEPB Inhale 2 puffs into the lungs 2 (two) times daily.     [provider]  folic acid (FOLVITE) 1 MG tablet Take 1 mg by mouth daily.    [provider]  gabapentin (NEURONTIN) 300 MG capsule Take 1 capsule (300 mg total) by mouth at bedtime. 05/13/18 06/12/18  Zigmund Daniel., MD  levETIRAcetam (KEPPRA) 500 MG tablet Take 1 tablet (500 mg total) by mouth 2 (two) times daily. 05/13/18 06/12/18  Zigmund Daniel., MD  metoprolol succinate (TOPROL-XL) 25 MG 24 hr tablet Take 25 mg by mouth daily.    [provider]  oxyCODONE (OXY IR/ROXICODONE) 5 MG immediate release tablet Take 1-2 tablets (5-10 mg total) by mouth every 6 (six) hours as needed for moderate pain or severe pain. Patient not taking: Reported on 07/31/2018 11/26/17   Rozann Lesches, MD  Oxycodone HCl 20 MG TABS Take 1 tablet (20 mg total) by mouth 4 (four) times daily. 07/31/18   Caldonia Leap, Cristal Deer, PA-C  predniSONE (DELTASONE) 10 MG tablet Take 1.5 tablets (15 mg total) by mouth daily with breakfast. 11/26/17   Nedrud, Jeanella Flattery, MD  senna-docusate (SENOKOT-S) 8.6-50 MG tablet Take 2 tablets by mouth 2 (two) times daily. Patient taking differently: Take 2 tablets by mouth 2 (two) times daily as needed for moderate constipation.  11/26/17   Rozann Lesches, MD  tiZANidine (ZANAFLEX) 4 MG tablet Take 1 tablet (4 mg total) by mouth 4 (four) times daily as needed for muscle spasms. 11/26/17   Rozann Lesches, MD  torsemide (DEMADEX) 10 MG tablet Take 10 mg by mouth daily.    [provider]  Vitamin D, Ergocalciferol, (DRISDOL) 50000 UNITS CAPS capsule Take 1 capsule (50,000 Units total) by mouth every 7 (seven) days. Sunday Patient taking differently: Take 50,000 Units by mouth every 30 (thirty) days.  01/27/15   Calvert Cantor, MD    Family History Family History  Problem Relation Age of  Onset  . Kidney failure Mother   . Hypertension Sister   . Hypertension Brother     Social History Social History   Tobacco Use  . Smoking status: Former Smoker    Last attempt to quit: 2010    Years since quitting: 9.6  . Smokeless tobacco: Never Used  Substance Use Topics  . Alcohol use: No    Comment: admits to 2 drinks/week  . Drug use: No     Allergies   Ivp dye [iodinated diagnostic agents] and Metrizamide   Review of Systems Review of Systems All other systems negative except as documented in the HPI. All pertinent positives and negatives as reviewed in the HPI.  Physical Exam Updated Vital Signs BP (!) 127/94   Pulse 81   Temp 98.3 F (36.8 C) (Oral)   Resp 14   Ht 5' (1.524 m)   Wt  59 kg   SpO2 100%   BMI 25.39 kg/m   Physical Exam  Constitutional: She is oriented to person, place, and time. She appears well-developed and well-nourished. No distress.  HENT:  Head: Normocephalic and atraumatic.  Mouth/Throat: Oropharynx is clear and moist.  Eyes: Pupils are equal, round, and reactive to light.  Neck: Normal range of motion. Neck supple.  Cardiovascular: Normal rate, regular rhythm and normal heart sounds. Exam reveals no gallop and no friction rub.  No murmur heard. Pulmonary/Chest: Effort normal and breath sounds normal. No respiratory distress. She has no wheezes.  Abdominal: Soft. Bowel sounds are normal. She exhibits no distension. There is no tenderness.  Neurological: She is alert and oriented to person, place, and time. She exhibits normal muscle tone. Coordination normal.  Skin: Skin is warm and dry. Capillary refill takes less than 2 seconds. No rash noted. No erythema.  Psychiatric: She has a normal mood and affect. Her behavior is normal.  Nursing note and vitals reviewed.    ED Treatments / Results  Labs (all labs ordered are listed, but only abnormal results are displayed) Labs Reviewed  COMPREHENSIVE METABOLIC PANEL - Abnormal;  Notable for the following components:      Result Value   CO2 20 (*)    Glucose, Bld 219 (*)    BUN <5 (*)    Creatinine, Ser 1.01 (*)    Calcium 7.4 (*)    Total Protein 4.7 (*)    Albumin 1.5 (*)    GFR calc non Af Amer 59 (*)    All other components within normal limits  CBC WITH DIFFERENTIAL/PLATELET - Abnormal; Notable for the following components:   RBC 3.03 (*)    Hemoglobin 7.8 (*)    HCT 23.1 (*)    MCV 76.2 (*)    MCH 25.7 (*)    RDW 15.9 (*)    Platelets 71 (*)    Monocytes Absolute 1.1 (*)    All other components within normal limits  URINALYSIS, ROUTINE W REFLEX MICROSCOPIC - Abnormal; Notable for the following components:   Glucose, UA 50 (*)    All other components within normal limits  CBG MONITORING, ED - Abnormal; Notable for the following components:   Glucose-Capillary 57 (*)    All other components within normal limits  CBG MONITORING, ED - Abnormal; Notable for the following components:   Glucose-Capillary 186 (*)    All other components within normal limits  CBG MONITORING, ED - Abnormal; Notable for the following components:   Glucose-Capillary 189 (*)    All other components within normal limits  CBG MONITORING, ED - Abnormal; Notable for the following components:   Glucose-Capillary 162 (*)    All other components within normal limits  URINE CULTURE    EKG None  Radiology No results found.  Procedures Procedures (including critical care time)  Medications Ordered in ED Medications  dextrose 50 % solution 50 mL (50 mLs Intravenous Given 07/31/18 0613)  morphine 4 MG/ML injection 4 mg (4 mg Intravenous Given 07/31/18 0946)  sodium chloride 0.9 % bolus 500 mL (0 mLs Intravenous Stopped 07/31/18 1305)  oxyCODONE (Oxy IR/ROXICODONE) immediate release tablet 20 mg (20 mg Oral Given 07/31/18 1202)     Initial Impression / Assessment and Plan / ED Course  I have reviewed the triage vital signs and the nursing notes.  Pertinent labs & imaging  results that were available during my care of the patient were reviewed by me and considered  in my medical decision making (see chart for details).     Patient states she is mainly here for the body aches that are due to the fact that she is run out of her medications.  Patient states that she is due to see her doctor in the next few days.  The patient has been stable otherwise.  She has no signs or symptoms associated with her low hemoglobin.  She had lower hemoglobins in the past which did require transfusion but at this point do not feel she needs this.  Her vital signs remained stable here and advised to return for any worsening in her condition.  Final Clinical Impressions(s) / ED Diagnoses   Final diagnoses:  Arthralgia, unspecified joint    ED Discharge Orders         Ordered    Oxycodone HCl 20 MG TABS  4 times daily     07/31/18 [address], PA-C 08/05/18 1952    Vanetta Mulders, MD 08/07/18 [phone]

## 2018-08-01 LAB — URINE CULTURE

## 2018-09-24 ENCOUNTER — Encounter (HOSPITAL_COMMUNITY): Payer: Self-pay | Admitting: Emergency Medicine

## 2018-09-24 ENCOUNTER — Inpatient Hospital Stay (HOSPITAL_COMMUNITY)
Admission: EM | Admit: 2018-09-24 | Discharge: 2018-10-01 | DRG: 871 | Disposition: A | Discharge status: Home Health | Payer: Medicaid Other | Attending: Internal Medicine | Admitting: Internal Medicine

## 2018-09-24 ENCOUNTER — Emergency Department (HOSPITAL_COMMUNITY): Payer: Medicaid Other

## 2018-09-24 ENCOUNTER — Other Ambulatory Visit: Payer: Self-pay

## 2018-09-24 DIAGNOSIS — R6521 Severe sepsis with septic shock: Secondary | ICD-10-CM | POA: Diagnosis present

## 2018-09-24 DIAGNOSIS — K219 Gastro-esophageal reflux disease without esophagitis: Secondary | ICD-10-CM | POA: Diagnosis present

## 2018-09-24 DIAGNOSIS — Z681 Body mass index (BMI) 19 or less, adult: Secondary | ICD-10-CM

## 2018-09-24 DIAGNOSIS — A419 Sepsis, unspecified organism: Secondary | ICD-10-CM | POA: Diagnosis not present

## 2018-09-24 DIAGNOSIS — E872 Acidosis: Secondary | ICD-10-CM | POA: Diagnosis present

## 2018-09-24 DIAGNOSIS — Z841 Family history of disorders of kidney and ureter: Secondary | ICD-10-CM

## 2018-09-24 DIAGNOSIS — N390 Urinary tract infection, site not specified: Secondary | ICD-10-CM | POA: Diagnosis not present

## 2018-09-24 DIAGNOSIS — G2401 Drug induced subacute dyskinesia: Secondary | ICD-10-CM | POA: Diagnosis present

## 2018-09-24 DIAGNOSIS — I5030 Unspecified diastolic (congestive) heart failure: Secondary | ICD-10-CM | POA: Diagnosis not present

## 2018-09-24 DIAGNOSIS — I480 Paroxysmal atrial fibrillation: Secondary | ICD-10-CM | POA: Diagnosis present

## 2018-09-24 DIAGNOSIS — M1990 Unspecified osteoarthritis, unspecified site: Secondary | ICD-10-CM | POA: Diagnosis present

## 2018-09-24 DIAGNOSIS — I10 Essential (primary) hypertension: Secondary | ICD-10-CM | POA: Diagnosis present

## 2018-09-24 DIAGNOSIS — G9341 Metabolic encephalopathy: Secondary | ICD-10-CM | POA: Diagnosis present

## 2018-09-24 DIAGNOSIS — M549 Dorsalgia, unspecified: Secondary | ICD-10-CM | POA: Diagnosis present

## 2018-09-24 DIAGNOSIS — Z91041 Radiographic dye allergy status: Secondary | ICD-10-CM

## 2018-09-24 DIAGNOSIS — E861 Hypovolemia: Secondary | ICD-10-CM | POA: Diagnosis not present

## 2018-09-24 DIAGNOSIS — R4182 Altered mental status, unspecified: Secondary | ICD-10-CM

## 2018-09-24 DIAGNOSIS — Z87891 Personal history of nicotine dependence: Secondary | ICD-10-CM

## 2018-09-24 DIAGNOSIS — G8929 Other chronic pain: Secondary | ICD-10-CM | POA: Diagnosis present

## 2018-09-24 DIAGNOSIS — D649 Anemia, unspecified: Secondary | ICD-10-CM | POA: Diagnosis not present

## 2018-09-24 DIAGNOSIS — Y95 Nosocomial condition: Secondary | ICD-10-CM | POA: Diagnosis present

## 2018-09-24 DIAGNOSIS — Z888 Allergy status to other drugs, medicaments and biological substances status: Secondary | ICD-10-CM | POA: Diagnosis not present

## 2018-09-24 DIAGNOSIS — R627 Adult failure to thrive: Secondary | ICD-10-CM | POA: Diagnosis present

## 2018-09-24 DIAGNOSIS — D509 Iron deficiency anemia, unspecified: Secondary | ICD-10-CM | POA: Diagnosis present

## 2018-09-24 DIAGNOSIS — Z7952 Long term (current) use of systemic steroids: Secondary | ICD-10-CM | POA: Diagnosis not present

## 2018-09-24 DIAGNOSIS — A4151 Sepsis due to Escherichia coli [E. coli]: Secondary | ICD-10-CM | POA: Diagnosis present

## 2018-09-24 DIAGNOSIS — J440 Chronic obstructive pulmonary disease with acute lower respiratory infection: Secondary | ICD-10-CM | POA: Diagnosis present

## 2018-09-24 DIAGNOSIS — D696 Thrombocytopenia, unspecified: Secondary | ICD-10-CM | POA: Diagnosis present

## 2018-09-24 DIAGNOSIS — J449 Chronic obstructive pulmonary disease, unspecified: Secondary | ICD-10-CM | POA: Diagnosis not present

## 2018-09-24 DIAGNOSIS — Z8673 Personal history of transient ischemic attack (TIA), and cerebral infarction without residual deficits: Secondary | ICD-10-CM

## 2018-09-24 DIAGNOSIS — J9811 Atelectasis: Secondary | ICD-10-CM | POA: Diagnosis present

## 2018-09-24 DIAGNOSIS — E876 Hypokalemia: Secondary | ICD-10-CM | POA: Diagnosis present

## 2018-09-24 DIAGNOSIS — J189 Pneumonia, unspecified organism: Secondary | ICD-10-CM | POA: Diagnosis present

## 2018-09-24 DIAGNOSIS — Z79899 Other long term (current) drug therapy: Secondary | ICD-10-CM

## 2018-09-24 DIAGNOSIS — Z79891 Long term (current) use of opiate analgesic: Secondary | ICD-10-CM

## 2018-09-24 DIAGNOSIS — E785 Hyperlipidemia, unspecified: Secondary | ICD-10-CM | POA: Diagnosis present

## 2018-09-24 DIAGNOSIS — G40909 Epilepsy, unspecified, not intractable, without status epilepticus: Secondary | ICD-10-CM | POA: Diagnosis present

## 2018-09-24 DIAGNOSIS — Z8249 Family history of ischemic heart disease and other diseases of the circulatory system: Secondary | ICD-10-CM

## 2018-09-24 DIAGNOSIS — I272 Pulmonary hypertension, unspecified: Secondary | ICD-10-CM | POA: Diagnosis present

## 2018-09-24 DIAGNOSIS — Z22322 Carrier or suspected carrier of Methicillin resistant Staphylococcus aureus: Secondary | ICD-10-CM

## 2018-09-24 DIAGNOSIS — Z9049 Acquired absence of other specified parts of digestive tract: Secondary | ICD-10-CM

## 2018-09-24 LAB — I-STAT CG4 LACTIC ACID, ED
Lactic Acid, Venous: 2.96 mmol/L (ref 0.5–1.9)
Lactic Acid, Venous: 3.38 mmol/L (ref 0.5–1.9)

## 2018-09-24 LAB — I-STAT ARTERIAL BLOOD GAS, ED
Acid-base deficit: 6 mmol/L — ABNORMAL HIGH (ref 0.0–2.0)
Bicarbonate: 20.2 mmol/L (ref 20.0–28.0)
O2 Saturation: 95 %
PCO2 ART: 42 mmHg (ref 32.0–48.0)
PH ART: 7.29 — AB (ref 7.350–7.450)
PO2 ART: 87 mmHg (ref 83.0–108.0)
Patient temperature: 98.6
TCO2: 21 mmol/L — ABNORMAL LOW (ref 22–32)

## 2018-09-24 LAB — CBC WITH DIFFERENTIAL/PLATELET
Abs Immature Granulocytes: 0.03 10*3/uL (ref 0.00–0.07)
BASOS PCT: 0 %
Basophils Absolute: 0 10*3/uL (ref 0.0–0.1)
EOS ABS: 0 10*3/uL (ref 0.0–0.5)
EOS PCT: 0 %
HEMATOCRIT: 27.4 % — AB (ref 36.0–46.0)
Hemoglobin: 8.8 g/dL — ABNORMAL LOW (ref 12.0–15.0)
Immature Granulocytes: 0 %
LYMPHS ABS: 3.7 10*3/uL (ref 0.7–4.0)
Lymphocytes Relative: 49 %
MCH: 26.2 pg (ref 26.0–34.0)
MCHC: 32.1 g/dL (ref 30.0–36.0)
MCV: 81.5 fL (ref 80.0–100.0)
Monocytes Absolute: 0.2 10*3/uL (ref 0.1–1.0)
Monocytes Relative: 3 %
Neutro Abs: 3.6 10*3/uL (ref 1.7–7.7)
Neutrophils Relative %: 48 %
PLATELETS: DECREASED 10*3/uL (ref 150–400)
RBC: 3.36 MIL/uL — ABNORMAL LOW (ref 3.87–5.11)
RDW: 18.4 % — AB (ref 11.5–15.5)
WBC: 7.6 10*3/uL (ref 4.0–10.5)
nRBC: 0 % (ref 0.0–0.2)

## 2018-09-24 LAB — I-STAT CHEM 8, ED
BUN: 14 mg/dL (ref 6–20)
CHLORIDE: 115 mmol/L — AB (ref 98–111)
Calcium, Ion: 1.13 mmol/L — ABNORMAL LOW (ref 1.15–1.40)
Creatinine, Ser: 0.9 mg/dL (ref 0.44–1.00)
Glucose, Bld: 112 mg/dL — ABNORMAL HIGH (ref 70–99)
HCT: 29 % — ABNORMAL LOW (ref 36.0–46.0)
Hemoglobin: 9.9 g/dL — ABNORMAL LOW (ref 12.0–15.0)
POTASSIUM: 5.5 mmol/L — AB (ref 3.5–5.1)
SODIUM: 142 mmol/L (ref 135–145)
TCO2: 23 mmol/L (ref 22–32)

## 2018-09-24 LAB — CBG MONITORING, ED: GLUCOSE-CAPILLARY: 92 mg/dL (ref 70–99)

## 2018-09-24 MED ORDER — SODIUM CHLORIDE 0.9 % IV BOLUS
1000.0000 mL | Freq: Once | INTRAVENOUS | Status: AC
  Administered 2018-09-24: 1000 mL via INTRAVENOUS

## 2018-09-24 MED ORDER — LACTATED RINGERS IV SOLN
INTRAVENOUS | Status: DC
  Administered 2018-09-25 – 2018-09-27 (×4): via INTRAVENOUS
  Administered 2018-09-28: 1000 mL via INTRAVENOUS

## 2018-09-24 MED ORDER — LEVETIRACETAM 500 MG PO TABS: 500.0000 mg | ORAL_TABLET | Freq: Two times a day (BID) | ORAL | Status: DC

## 2018-09-24 MED ORDER — ATORVASTATIN CALCIUM 10 MG PO TABS
10.0000 mg | ORAL_TABLET | Freq: Every day | ORAL | Status: DC
  Administered 2018-09-25 – 2018-09-30 (×5): 10 mg via ORAL
  Filled 2018-09-24 (×7): qty 1

## 2018-09-24 MED ORDER — CALCIUM GLUCONATE-NACL 1-0.675 GM/50ML-% IV SOLN
1.0000 g | Freq: Once | INTRAVENOUS | Status: AC
  Administered 2018-09-25: 1000 mg via INTRAVENOUS
  Filled 2018-09-24: qty 50

## 2018-09-24 MED ORDER — PIPERACILLIN-TAZOBACTAM 3.375 G IVPB 30 MIN
3.3750 g | Freq: Once | INTRAVENOUS | Status: AC
  Administered 2018-09-24: 3.375 g via INTRAVENOUS
  Filled 2018-09-24: qty 50

## 2018-09-24 MED ORDER — PIPERACILLIN-TAZOBACTAM IN DEX 2-0.25 GM/50ML IV SOLN: 2.2500 g | Freq: Once | INTRAVENOUS | Status: DC

## 2018-09-24 MED ORDER — RACEPINEPHRINE HCL 2.25 % IN NEBU
0.5000 mL | INHALATION_SOLUTION | Freq: Once | RESPIRATORY_TRACT | Status: DC
  Filled 2018-09-24: qty 0.5

## 2018-09-24 MED ORDER — HYDROCORTISONE NA SUCCINATE PF 100 MG IJ SOLR
100.0000 mg | Freq: Once | INTRAMUSCULAR | Status: AC
  Administered 2018-09-24: 100 mg via INTRAVENOUS
  Filled 2018-09-24: qty 2

## 2018-09-24 MED ORDER — PANTOPRAZOLE SODIUM 40 MG PO TBEC
40.0000 mg | DELAYED_RELEASE_TABLET | Freq: Two times a day (BID) | ORAL | Status: DC
  Administered 2018-09-25 (×3): 40 mg via ORAL
  Filled 2018-09-24 (×4): qty 1

## 2018-09-24 MED ORDER — VANCOMYCIN HCL IN DEXTROSE 1-5 GM/200ML-% IV SOLN
1000.0000 mg | Freq: Once | INTRAVENOUS | Status: AC
  Administered 2018-09-24: 1000 mg via INTRAVENOUS
  Filled 2018-09-24: qty 200

## 2018-09-24 MED ORDER — VITAMIN D (ERGOCALCIFEROL) 1.25 MG (50000 UNIT) PO CAPS: 50000.0000 [IU] | ORAL_CAPSULE | ORAL | Status: DC

## 2018-09-24 MED ORDER — HYDROCORTISONE NA SUCCINATE PF 100 MG IJ SOLR
50.0000 mg | Freq: Four times a day (QID) | INTRAMUSCULAR | Status: DC
  Administered 2018-09-25 – 2018-09-26 (×5): 50 mg via INTRAVENOUS
  Filled 2018-09-24 (×5): qty 2

## 2018-09-24 MED ORDER — VANCOMYCIN HCL 500 MG IV SOLR
500.0000 mg | Freq: Two times a day (BID) | INTRAVENOUS | Status: DC
  Administered 2018-09-25 – 2018-09-26 (×3): 500 mg via INTRAVENOUS
  Filled 2018-09-24 (×3): qty 500

## 2018-09-24 MED ORDER — VALPROIC ACID 250 MG PO CAPS
500.0000 mg | ORAL_CAPSULE | Freq: Three times a day (TID) | ORAL | Status: DC
  Administered 2018-09-25 (×4): 500 mg via ORAL
  Filled 2018-09-24 (×2): qty 2
  Filled 2018-09-24: qty 1
  Filled 2018-09-24 (×3): qty 2

## 2018-09-24 MED ORDER — HEPARIN SODIUM (PORCINE) 5000 UNIT/ML IJ SOLN
5000.0000 [IU] | Freq: Three times a day (TID) | INTRAMUSCULAR | Status: DC
  Administered 2018-09-25 – 2018-09-26 (×5): 5000 [IU] via SUBCUTANEOUS
  Filled 2018-09-24 (×5): qty 1

## 2018-09-24 MED ORDER — SODIUM CHLORIDE 0.9 % IV BOLUS
500.0000 mL | Freq: Once | INTRAVENOUS | Status: AC
  Administered 2018-09-24: 500 mL via INTRAVENOUS

## 2018-09-24 NOTE — Progress Notes (Signed)
Arterial gas drawn on 2lpm.

## 2018-09-24 NOTE — ED Notes (Addendum)
Dr Madilyn Hook informed of lactic acid results 2.96

## 2018-09-24 NOTE — ED Notes (Signed)
Pt expressing that she can not breathe.

## 2018-09-24 NOTE — H&P (Signed)
NAME:  Amber [name], MRN:  [id number], DOB:  [birth year], LOS: 0 ADMISSION DATE:  09/24/2018, CONSULTATION DATE:  09/24/18 REFERRING MD:  Madilyn Hook CHIEF COMPLAINT:  AMS, hypotension   Brief History   Amber [name] is a 62 y.o. female who was admitted 11/5 with hypotension and AMS.  Had recent admission to [facility] for CAP and anemia due to GI bleed (was on eliquis for PAF which was held during this admission and d/c'd after discussing the risk of rebleed).  Past Medical History  Seizures, CVA, HTN, Anemia, GERD, GI bleed, arthritis.  Significant Hospital Events   11/5 > admit.  Consults: date of consult/date signed off & final recs:  None.  Procedures (surgical and bedside):  None.  Significant Diagnostic Tests:  CXR 11/5 > mild b/l infrahilar opacities. Echo 11/6 >   Micro Data:  Blood 11/5 >  Sputum 11/5 >  Urine 11/5 >  RVP 11/5 >   Antimicrobials:  Vanc 11/5 >  Zosyn 11/5 >    Subjective:  Somnolent but arouses to voice.  Nods head "yes" when asked if she's feeling any better from when first coming to ED.  Denies any pain, dyspnea.  Objective:  Blood pressure 98/75, pulse (!) 117, temperature (!) 97.5 F (36.4 C), temperature source Rectal, resp. rate 16, height 5' (1.524 m), weight 59 kg, SpO2 95 %.        Intake/Output Summary (Last 24 hours) at 09/24/2018 2324 Last data filed at 09/24/2018 2139 Gross per 24 hour  Intake 1000 ml  Output -  Net 1000 ml   Filed Weights   09/24/18 1951  Weight: 59 kg    Examination: General: Elderly female, in NAD. Neuro: Somnolent but arouses to voice.  No deficits.  Nods head appropriately in response to questions.  Voices random words. HEENT: [place]/AT. Sclerae anicteric.  EOMI. Cardiovascular: RRR, no M/R/G.  Lungs: Respirations even and unlabored.  CTA bilaterally, No W/R/R.  Abdomen: BS x 4, soft, NT/ND.  Musculoskeletal: No gross deformities, 1+ edema.  Skin: Intact, warm, no rashes.  Assessment & Plan:    HCAP. - Continue empiric abx (vanc / zosyn). - Follow cultures and PCT, de-escalate abx as able. - Trend lactate.  Hypotension - due to above. - Continue fluids. - Assess echo. - Start stress dose steroids (on chronic prednisone for arthritis).  Hypocalcemia. - 1g Ca gluconate.  Anemia - chronic. - Transfuse for Hgb < 7.  Hx seizures. - Continue preadmission valproic acid.  Failure to thrive - per notes from [facility], had 5 admissions this year so far.  Discussed goals of care with family who have opted to continue with full code status. - PT efforts.  Hx HTN, HLD, PAF (previously on eliquis, d/c'd after last admission to [facility] due to GI bleed). - Continue preadmission atorvastatin. - Hold preadmission toprol-xl.  Disposition / Summary of Today's Plan 09/24/18   Admit to ICU. Continue fluids, abx.    Diet: NPO. Pain/Anxiety/Delirium protocol (if indicated): N/A. VAP protocol (if indicated): N/A. DVT prophylaxis: SCD's / Heparin. GI prophylaxis: N/A. Hyperglycemia protocol: N/A. Mobility: Bedrest. Code Status: Full. Family Communication: Son and Daughter updated at bedside.  Labs   CBC: Recent Labs  Lab 09/24/18 1924 09/24/18 1939  WBC 7.6  --   NEUTROABS 3.6  --   HGB 8.8* 9.9*  HCT 27.4* 29.0*  MCV 81.5  --   PLT PLATELET CLUMPS NOTED ON SMEAR, COUNT APPEARS DECREASED  --  Basic Metabolic Panel: Recent Labs  Lab 09/24/18 1939  NA 142  K 5.5*  CL 115*  GLUCOSE 112*  BUN 14  CREATININE 0.90   GFR: Estimated Creatinine Clearance: 53.4 mL/min (by C-G formula based on SCr of 0.9 mg/dL). Recent Labs  Lab 09/24/18 1924 09/24/18 1935  WBC 7.6  --   LATICACIDVEN  --  2.96*   Liver Function Tests: No results for input(s): AST, ALT, ALKPHOS, BILITOT, PROT, ALBUMIN in the last 168 hours. No results for input(s): LIPASE, AMYLASE in the last 168 hours. No results for input(s): AMMONIA in the last 168 hours. ABG    Component  Value Date/Time   PHART 7.290 (L) 09/24/2018 1959   PCO2ART 42.0 09/24/2018 1959   PO2ART 87.0 09/24/2018 1959   HCO3 20.2 09/24/2018 1959   TCO2 21 (L) 09/24/2018 1959   ACIDBASEDEF 6.0 (H) 09/24/2018 1959   O2SAT 95.0 09/24/2018 1959    Coagulation Profile: No results for input(s): INR, PROTIME in the last 168 hours. Cardiac Enzymes: No results for input(s): CKTOTAL, CKMB, CKMBINDEX, TROPONINI in the last 168 hours. HbA1C: Hgb A1c MFr Bld  Date/Time Value Ref Range Status  05/08/2018 11:14 AM 5.7 (H) 4.8 - 5.6 % Final    Comment:    (NOTE) Pre diabetes:          5.7%-6.4% Diabetes:              >6.4% Glycemic control for   <7.0% adults with diabetes   10/27/2016 05:32 AM 4.7 (L) 4.8 - 5.6 % Final    Comment:    (NOTE)         Pre-diabetes: 5.7 - 6.4         Diabetes: >6.4         Glycemic control for adults with diabetes: <7.0    CBG: Recent Labs  Lab 09/24/18 1908  GLUCAP 92    Admitting History of Present Illness.   Pt is encephelopathic; therefore, this HPI is obtained from chart review. Amber [name] is a 62 y.o. female who has a PMH as outlined below.  She presented to [facility] ED 11/5 with AMS and generalized weakness.  Per family, she was recently hospitalized at [facility] from 10/17 through 10/31 for CAP and anemia due to GI bleed (was on eliquis for PAF which was held during this admission and d/c'd after discussing the risk of rebleed).  She was discharged home and had been doing well up until morning of 11/5 when she had generalized weakness followed by AMS.  Family subsequently brought her to ED for further evaluation.  In ED, she was found to have hypotension.  She was given 30cc/kg with minimal improvement in BP.  PCCM was subsequently called for admission.  Review of Systems:   Unable to obtain as pt is encephalopathic.  Past medical history  She,  has a past medical history of ACS (acute coronary syndrome) (HCC) (06/12/2015), Acute on chronic  blood loss anemia (11/25/2017), Arthritis, Asthma, Cerebrovascular accident (CVA) due to embolism (HCC) (10/26/2016), Chronic pain, Constipation, Diabetes mellitus without complication (HCC), Duodenitis, Embolic stroke (HCC), Falls frequently, Focal seizure (HCC), GERD (gastroesophageal reflux disease), Hypertension, Impacted fracture (02/15/2017), Pneumonia (10/2015), Seizures (HCC), and Small bowel obstruction (HCC) (11/25/2017).   Surgical History    Past Surgical History:  Procedure Laterality Date  . APPENDECTOMY    . BACK SURGERY    . CHOLECYSTECTOMY    . TEE WITHOUT CARDIOVERSION N/A 10/30/2016   Procedure: TRANSESOPHAGEAL ECHOCARDIOGRAM (  TEE);  Surgeon: Lars Masson, MD;  Location: [facility] ENDOSCOPY;  Service: Cardiovascular;  Laterality: N/A;     Social History   Social History   Socioeconomic History  . Marital status: Single    Spouse name: Not on file  . Number of children: Not on file  . Years of education: Not on file  . Highest education level: Not on file  Occupational History  . Not on file  Social Needs  . Financial resource strain: Not on file  . Food insecurity:    Worry: Not on file    Inability: Not on file  . Transportation needs:    Medical: Not on file    Non-medical: Not on file  Tobacco Use  . Smoking status: Former Smoker    Last attempt to quit: 2010    Years since quitting: 9.8  . Smokeless tobacco: Never Used  Substance and Sexual Activity  . Alcohol use: No    Comment: admits to 2 drinks/week  . Drug use: No  . Sexual activity: Never  Lifestyle  . Physical activity:    Days per week: Not on file    Minutes per session: Not on file  . Stress: Not on file  Relationships  . Social connections:    Talks on phone: Not on file    Gets together: Not on file    Attends religious service: Not on file    Active member of club or organization: Not on file    Attends meetings of clubs or organizations: Not on file    Relationship status: Not on  file  . Intimate partner violence:    Fear of current or ex partner: Not on file    Emotionally abused: Not on file    Physically abused: Not on file    Forced sexual activity: Not on file  Other Topics Concern  . Not on file  Social History Narrative  . Not on file  ,  reports that she quit smoking about 9 years ago. She has never used smokeless tobacco. She reports that she does not drink alcohol or use drugs.   Family history   Her family history includes Hypertension in her brother and sister; Kidney failure in her mother.   Allergies Allergies  Allergen Reactions  . Ivp Dye [Iodinated Diagnostic Agents] Itching  . Metrizamide Itching    Home meds  Prior to Admission medications   Medication Sig Start Date End Date Taking? Authorizing Provider  allopurinol (ZYLOPRIM) 300 MG tablet Take 300 mg by mouth daily. 11/11/16  Yes [provider]  atorvastatin (LIPITOR) 10 MG tablet Take 10 mg by mouth daily at 6 PM.  08/13/15  Yes [provider]  ferrous sulfate 325 (65 FE) MG tablet Take 1 tablet (325 mg total) by mouth daily with breakfast. 11/27/17  Yes Nedrud, Jeanella Flattery, MD  metoprolol succinate (TOPROL-XL) 25 MG 24 hr tablet Take 25 mg by mouth daily.   Yes [provider]  Oxycodone HCl 20 MG TABS Take 1 tablet (20 mg total) by mouth 4 (four) times daily. 07/31/18  Yes Lawyer, Cristal Deer, PA-C  pantoprazole (PROTONIX) 40 MG tablet Take 40 mg by mouth 2 (two) times daily. 08/21/18  Yes [provider]  predniSONE (DELTASONE) 5 MG tablet Take 5 mg by mouth 3 (three) times daily.   Yes [provider]  valproic acid (DEPAKENE) 250 MG capsule Take 500 mg by mouth 3 (three) times daily.   Yes [provider]  feeding supplement, ENSURE ENLIVE, (ENSURE ENLIVE) LIQD Take 237 mLs by mouth 2 (two) times daily between meals. Patient not taking: Reported on 09/24/2018 11/10/15   Catarina Hartshorn, MD  gabapentin (NEURONTIN) 300 MG capsule Take 1  capsule (300 mg total) by mouth at bedtime. Patient not taking: Reported on 09/24/2018 05/13/18 09/24/26  Zigmund Daniel., MD  levETIRAcetam (KEPPRA) 500 MG tablet Take 1 tablet (500 mg total) by mouth 2 (two) times daily. Patient not taking: Reported on 09/24/2018 05/13/18 09/24/26  Zigmund Daniel., MD  oxyCODONE (OXY IR/ROXICODONE) 5 MG immediate release tablet Take 1-2 tablets (5-10 mg total) by mouth every 6 (six) hours as needed for moderate pain or severe pain. Patient not taking: Reported on 07/31/2018 11/26/17   Rozann Lesches, MD  senna-docusate (SENOKOT-S) 8.6-50 MG tablet Take 2 tablets by mouth 2 (two) times daily. Patient not taking: Reported on 09/24/2018 11/26/17   Rozann Lesches, MD  tiZANidine (ZANAFLEX) 4 MG tablet Take 1 tablet (4 mg total) by mouth 4 (four) times daily as needed for muscle spasms. Patient not taking: Reported on 09/24/2018 11/26/17   Rozann Lesches, MD  Vitamin D, Ergocalciferol, (DRISDOL) 50000 UNITS CAPS capsule Take 1 capsule (50,000 Units total) by mouth every 7 (seven) days. Sunday Patient not taking: Reported on 09/24/2018 01/27/15   Calvert Cantor, MD     Rutherford Guys, PA - [facility] Pager: [phone]  or [phone] 09/24/2018, 11:24 PM

## 2018-09-24 NOTE — Progress Notes (Signed)
Pharmacy Antibiotic Note  Amber [name] is a 62 y.o. female admitted on 09/24/2018 with pneumonia.  Pharmacy has been consulted for vancomycin dosing. Pt is afebrile and WBC is WNL. SCr is WNL and lactic acid is elevated.   Plan: Vancomycin 1gm IV x 1 then 500mg  IV Q12H F/u renal fxn, C&S, clinical status and trough at [facility] F/u continuation of zosyn or other gram negative coverage  Height: 5' (152.4 cm) Weight: 130 lb 1.1 oz (59 kg) IBW/kg (Calculated) : 45.5  Temp (24hrs), Avg:97.5 F (36.4 C), Min:97.5 F (36.4 C), Max:97.5 F (36.4 C)  Recent Labs  Lab 09/24/18 1935 09/24/18 1939  CREATININE  --  0.90  LATICACIDVEN 2.96*  --     Estimated Creatinine Clearance: 53.4 mL/min (by C-G formula based on SCr of 0.9 mg/dL).    Allergies  Allergen Reactions  . Ivp Dye [Iodinated Diagnostic Agents] Itching  . Metrizamide Itching    Antimicrobials this admission: Vanc 11/5>> Zosyn x 1 11/5  Dose adjustments this admission: N/A  Microbiology results: Pending  Thank you for allowing pharmacy to be a part of this patient's care.  Gaby Harney, 13/5 09/24/2018 7:57 PM

## 2018-09-24 NOTE — ED Triage Notes (Signed)
Pt BIB EMS from Home with family for c/o lethargy. Pt was recently hospitalized at [place] for /sepsis/anemia. Family reports that she was diagnosed with PNA 1 week ago, unsure if she has been taking her abx  Initial BP with EMS 70s/30 p 90-100 O2 95% on neb  given 10mg  albuterol and 1 atrovent with EMS PTA 22LFA

## 2018-09-24 NOTE — ED Notes (Signed)
Attempted x 3 for istat lactic pt has been stuck multiple times without success, IV team consulted for draw with IV start.

## 2018-09-24 NOTE — ED Notes (Signed)
PA Leaphart informed of lactic acid results 3.38

## 2018-09-25 ENCOUNTER — Inpatient Hospital Stay (HOSPITAL_COMMUNITY): Payer: Medicaid Other

## 2018-09-25 DIAGNOSIS — I5030 Unspecified diastolic (congestive) heart failure: Secondary | ICD-10-CM

## 2018-09-25 LAB — CBC
HCT: 25.5 % — ABNORMAL LOW (ref 36.0–46.0)
HEMOGLOBIN: 8 g/dL — AB (ref 12.0–15.0)
MCH: 26.1 pg (ref 26.0–34.0)
MCHC: 31.4 g/dL (ref 30.0–36.0)
MCV: 83.3 fL (ref 80.0–100.0)
PLATELETS: 81 10*3/uL — AB (ref 150–400)
RBC: 3.06 MIL/uL — ABNORMAL LOW (ref 3.87–5.11)
RDW: 18.5 % — ABNORMAL HIGH (ref 11.5–15.5)
WBC: 7.3 10*3/uL (ref 4.0–10.5)
nRBC: 0 % (ref 0.0–0.2)

## 2018-09-25 LAB — BASIC METABOLIC PANEL
Anion gap: 7 (ref 5–15)
BUN: 10 mg/dL (ref 6–20)
CO2: 15 mmol/L — ABNORMAL LOW (ref 22–32)
Calcium: 7.5 mg/dL — ABNORMAL LOW (ref 8.9–10.3)
Chloride: 121 mmol/L — ABNORMAL HIGH (ref 98–111)
Creatinine, Ser: 0.91 mg/dL (ref 0.44–1.00)
Glucose, Bld: 142 mg/dL — ABNORMAL HIGH (ref 70–99)
POTASSIUM: 4 mmol/L (ref 3.5–5.1)
SODIUM: 143 mmol/L (ref 135–145)

## 2018-09-25 LAB — ECHOCARDIOGRAM COMPLETE
Height: 60 in
Weight: 1506.18 oz

## 2018-09-25 LAB — URINALYSIS, ROUTINE W REFLEX MICROSCOPIC
Bilirubin Urine: NEGATIVE
GLUCOSE, UA: NEGATIVE mg/dL
HGB URINE DIPSTICK: NEGATIVE
Ketones, ur: NEGATIVE mg/dL
Nitrite: NEGATIVE
PH: 6 (ref 5.0–8.0)
PROTEIN: NEGATIVE mg/dL
SPECIFIC GRAVITY, URINE: 1.009 (ref 1.005–1.030)

## 2018-09-25 LAB — LACTIC ACID, PLASMA
LACTIC ACID, VENOUS: 5.8 mmol/L — AB (ref 0.5–1.9)
LACTIC ACID, VENOUS: 3.9 mmol/L — AB (ref 0.5–1.9)
Lactic Acid, Venous: 4 mmol/L (ref 0.5–1.9)
Lactic Acid, Venous: 4.7 mmol/L (ref 0.5–1.9)

## 2018-09-25 LAB — RESPIRATORY PANEL BY PCR
Adenovirus: NOT DETECTED
BORDETELLA PERTUSSIS-RVPCR: NOT DETECTED
CORONAVIRUS 229E-RVPPCR: NOT DETECTED
Chlamydophila pneumoniae: NOT DETECTED
Coronavirus HKU1: NOT DETECTED
Coronavirus NL63: NOT DETECTED
Coronavirus OC43: NOT DETECTED
INFLUENZA A-RVPPCR: NOT DETECTED
INFLUENZA B-RVPPCR: NOT DETECTED
Metapneumovirus: NOT DETECTED
Mycoplasma pneumoniae: NOT DETECTED
PARAINFLUENZA VIRUS 2-RVPPCR: NOT DETECTED
PARAINFLUENZA VIRUS 4-RVPPCR: NOT DETECTED
Parainfluenza Virus 1: NOT DETECTED
Parainfluenza Virus 3: NOT DETECTED
RESPIRATORY SYNCYTIAL VIRUS-RVPPCR: NOT DETECTED
Rhinovirus / Enterovirus: NOT DETECTED

## 2018-09-25 LAB — PHOSPHORUS: PHOSPHORUS: 3.9 mg/dL (ref 2.5–4.6)

## 2018-09-25 LAB — MRSA PCR SCREENING: MRSA BY PCR: NEGATIVE

## 2018-09-25 LAB — MAGNESIUM: MAGNESIUM: 1.4 mg/dL — AB (ref 1.7–2.4)

## 2018-09-25 LAB — PROCALCITONIN
PROCALCITONIN: 0.18 ng/mL
Procalcitonin: 0.6 ng/mL

## 2018-09-25 LAB — GLUCOSE, CAPILLARY: GLUCOSE-CAPILLARY: 149 mg/dL — AB (ref 70–99)

## 2018-09-25 LAB — VALPROIC ACID LEVEL: VALPROIC ACID LVL: 26 ug/mL — AB (ref 50.0–100.0)

## 2018-09-25 MED ORDER — SODIUM CHLORIDE 0.9 % IV BOLUS
1000.0000 mL | Freq: Once | INTRAVENOUS | Status: AC
  Administered 2018-09-25: 1000 mL via INTRAVENOUS

## 2018-09-25 MED ORDER — CHLORHEXIDINE GLUCONATE 0.12 % MT SOLN
15.0000 mL | Freq: Two times a day (BID) | OROMUCOSAL | Status: DC
  Administered 2018-09-25 – 2018-10-01 (×12): 15 mL via OROMUCOSAL
  Filled 2018-09-25 (×9): qty 15

## 2018-09-25 MED ORDER — MAGNESIUM SULFATE 2 GM/50ML IV SOLN
2.0000 g | Freq: Once | INTRAVENOUS | Status: AC
  Administered 2018-09-25: 2 g via INTRAVENOUS
  Filled 2018-09-25: qty 50

## 2018-09-25 MED ORDER — OXYCODONE HCL 5 MG PO TABS
10.0000 mg | ORAL_TABLET | Freq: Once | ORAL | Status: AC
  Administered 2018-09-25: 10 mg via ORAL
  Filled 2018-09-25: qty 2

## 2018-09-25 MED ORDER — ORAL CARE MOUTH RINSE
15.0000 mL | Freq: Two times a day (BID) | OROMUCOSAL | Status: DC
  Administered 2018-09-25 – 2018-09-30 (×12): 15 mL via OROMUCOSAL

## 2018-09-25 MED ORDER — ORAL CARE MOUTH RINSE
15.0000 mL | Freq: Two times a day (BID) | OROMUCOSAL | Status: DC
  Administered 2018-09-25: 15 mL via OROMUCOSAL

## 2018-09-25 MED ORDER — PIPERACILLIN-TAZOBACTAM 3.375 G IVPB
3.3750 g | Freq: Three times a day (TID) | INTRAVENOUS | Status: DC
  Administered 2018-09-25 – 2018-09-27 (×7): 3.375 g via INTRAVENOUS
  Filled 2018-09-25 (×8): qty 50

## 2018-09-25 NOTE — Progress Notes (Addendum)
CRITICAL VALUE ALERT  Critical Value:  4.0 lactic acid  Date & Time Notied:  11/06  0145  Provider Notified: Arsenio Loader   Orders Received/Actions taken: 1,000 mL NS bolus ; see orders

## 2018-09-25 NOTE — Progress Notes (Signed)
eLink Physician-Brief Progress Note Patient Name: Amber [name] DOB: 12-Aug-1956 MRN: [id number]   Date of Service  09/25/2018  HPI/Events of Note  LOW BP, LETHARGIC  eICU Interventions  C/W METABOLIC ACIDOSIS GIVE IVF'S SALINE BOLUS     Intervention Category Intermediate Interventions: Hypovolemia - evaluation and management  Eliel Dudding 09/25/2018, 10:09 PM

## 2018-09-25 NOTE — Progress Notes (Signed)
eLink Physician-Brief Progress Note Patient Name: Amber [name] DOB: December 18, 1955 MRN: [id number]   Date of Service  09/25/2018  HPI/Events of Note  Patient c/o pain. She take Oxycodone at home.   eICU Interventions  Will order: 1. Oxycodone IR 10 mg PO X 1 now.      Intervention Category Intermediate Interventions: Pain - evaluation and management  Caitlain Tweed [name] 09/25/2018, 4:12 AM

## 2018-09-25 NOTE — Progress Notes (Signed)
eLink Physician-Brief Progress Note Patient Name: Amber [name] DOB: Sep 22, 1956 MRN: [id number]   Date of Service  09/25/2018  HPI/Events of Note  Lactic Acid = 3.38 --> 4.0. Last LVEF = 65% to 70%.  eICU Interventions  Will order: 1. Bolus with 0.9 NaCl 1 liter IV over 1 hour now.      Intervention Category Major Interventions: Acid-Base disturbance - evaluation and management  Seger Jani [name] 09/25/2018, 1:45 AM

## 2018-09-25 NOTE — Progress Notes (Signed)
PT Cancellation Note  Patient Details Name: Amber [name] MRN: [id number] DOB: Jul 21, 1956   Cancelled Treatment:    Reason Eval/Treat Not Completed: Medical issues which prohibited therapy patient temp at 49, RN requests holding for now. Will cont to follow.   Etta Grandchild, PT, DPT Acute Rehabilitation Services Pager: [phone] Office: [phone]     Etta Grandchild 09/25/2018, 9:59 AM

## 2018-09-25 NOTE — Progress Notes (Signed)
Pharmacy Antibiotic Note  Amber [name] is a 62 y.o. female admitted on 09/24/2018 with pneumonia.  Pharmacy has been consulted for Zosyn dosing (already on Vancomycin). Renal function good.   Plan: Zosyn 3.375G IV q8h to be infused over 4 hours Already on vancomycin Trend WBC, temp, renal function  F/U infectious work-up   Height: 5' (152.4 cm) Weight: 130 lb 1.1 oz (59 kg) IBW/kg (Calculated) : 45.5  Temp (24hrs), Avg:97.5 F (36.4 C), Min:97.5 F (36.4 C), Max:97.5 F (36.4 C)  Recent Labs  Lab 09/24/18 1924 09/24/18 1935 09/24/18 1939 09/24/18 2325  WBC 7.6  --   --   --   CREATININE  --   --  0.90  --   LATICACIDVEN  --  2.96*  --  3.38*    Estimated Creatinine Clearance: 53.4 mL/min (by C-G formula based on SCr of 0.9 mg/dL).    Allergies  Allergen Reactions  . Ivp Dye [Iodinated Diagnostic Agents] Itching  . Metrizamide Itching     Abran Duke 09/25/2018 12:44 AM

## 2018-09-25 NOTE — Progress Notes (Signed)
CRITICAL VALUE ALERT  Critical Value:  Lactic 3.9  Date & Time Notied:  09/25/18 1517  Provider Notified: Yes  Orders Received/Actions taken: Awaiting orders

## 2018-09-25 NOTE — Progress Notes (Signed)
eLink Physician-Brief Progress Note Patient Name: Amber [name] DOB: 06-15-1956 MRN: [id number]   Date of Service  09/25/2018  HPI/Events of Note  Lactic Acid = 4.0 --> 5.8.  eICU Interventions  Will bolus with 0.9 NaCl 1 liter IV over 1 hour now.      Intervention Category Major Interventions: Acid-Base disturbance - evaluation and management  , [name] 09/25/2018, 5:03 AM

## 2018-09-25 NOTE — Progress Notes (Signed)
  Echocardiogram 2D Echocardiogram has been performed.  Amber [name] 09/25/2018, 2:54 PM

## 2018-09-25 NOTE — ED Provider Notes (Signed)
[facility] MEDICAL ICU Provider Note   CSN: [id number] Arrival date & time: 09/24/18  1856     History   Chief Complaint Chief Complaint  Patient presents with  . Code Sepsis    HPI Amber [name] is a 62 y.o. female.  HPI 62 year old African-American female with a complex past medical history presents to the ED for evaluation of altered mental status, shortness of breath and cough.  Patient has known ACS, prior CVA, hypertension, seizures, COPD and asthma.  Per family patient today had increased levels are altered mental status.  States that she has had a pretty progressive cough and complained of some shortness of breath today.  Patient was recently discharged from a 1 week stay at [facility] for sepsis secondary to community-acquired pneumonia.  At that time she was also noted to have a GI bleed secondary to her Eliquis used for A. fib.  Her Eliquis was discontinued.  She also had hypoglycemia secondary to failure to thrive.  Patient was started on Remeron and has been on 5 mg of prednisone daily.  According to family they were not aware of the medication the patient needed upon discharge.  She has not been receiving her Remeron or antibiotics at home.  States that she has progressively worsening since discharge from the hospital.  Patient will go in and out levels of consciousness.  She states that she is having a difficult time breathing.  Denies any chest pain.  Does report productive cough.  Patient is not on home oxygen.  She denies any tobacco use.  Nothing makes her symptoms better or worse.  EMS did give albuterol nebulizer in route by EMS.  She received no other medications.  She was noted to be hypotensive in route by EMS however does have a history of hypertension is on blood pressure medication.  Patient is on chronic pain medication but the family states that she is not taking any extra of these medications I do monitor these very  closely.  Pt denies any fever, chill, ha, vision changes, lightheadedness, dizziness, congestion, neck pain, cp, abd pain, n/v/d, urinary symptoms, change in bowel habits, melena, hematochezia, lower extremity paresthesias.  Past Medical History:  Diagnosis Date  . ACS (acute coronary syndrome) (HCC) 06/12/2015  . Acute on chronic blood loss anemia 11/25/2017  . Arthritis   . Asthma   . 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 Chronic pain   . Constipation   . Diabetes mellitus without complication (HCC)   . Duodenitis   . Embolic stroke (HCC)   . Falls frequently   . Focal seizure (HCC)   . GERD (gastroesophageal reflux disease)   . Hypertension   . Impacted fracture 02/15/2017   right glenoid  . Pneumonia 10/2015  . Seizures (HCC)    last seizure March 2015  . Small bowel obstruction (HCC) 11/25/2017    Patient Active Problem List   Diagnosis Date Noted  . HCAP (healthcare-associated pneumonia) 09/24/2018  . Acute metabolic encephalopathy 05/09/2018  . Metabolic acidosis 05/09/2018  . Mild persistent asthma 05/09/2018  . 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 Moderate malnutrition (HCC) 05/07/2018  . PAF (paroxysmal atrial fibrillation) (HCC) 05/07/2018  . Nausea & vomiting 05/06/2018  . Chronic anemia 05/06/2018  . PFO (patent foramen ovale) 02/15/2017  . History of CVA with residual deficit 02/15/2017  . Drug-induced hyperglycemia 02/15/2017  . 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 Abdominal pain  11/07/2015  . Constipation 11/07/2015  . Hypomagnesemia 08/25/2015  . Diabetes mellitus type 2, controlled (HCC) 07/27/2015  . Chronic back pain   . Frequent falls   . Essential hypertension   . Pulmonary hypertension (HCC)   . Gout   . Microcytic anemia 01/26/2015  . History of seizure 01/26/2015    Past Surgical History:  Procedure Laterality Date  . APPENDECTOMY    . BACK SURGERY    . CHOLECYSTECTOMY    . TEE WITHOUT  CARDIOVERSION N/A 10/30/2016   Procedure: TRANSESOPHAGEAL ECHOCARDIOGRAM (TEE);  Surgeon: Lars Masson, MD;  Location[facility] ENDOSCOPY;  Service: Cardiovascular;  Laterality: N/A;     OB History   None      Home Medications    Prior to Admission medications   Medication Sig Start Date End Date Taking? Authorizing Provider  allopurinol (ZYLOPRIM) 300 MG tablet Take 300 mg by mouth daily. 11/11/16  Yes [provider]  atorvastatin (LIPITOR) 10 MG tablet Take 10 mg by mouth daily at 6 PM.  08/13/15  Yes [provider]  ferrous sulfate 325 (65 FE) MG tablet Take 1 tablet (325 mg total) by mouth daily with breakfast. 11/27/17  Yes Nedrud, Jeanella Flattery, MD  metoprolol succinate (TOPROL-XL) 25 MG 24 hr tablet Take 25 mg by mouth daily.   Yes [provider]  Oxycodone HCl 20 MG TABS Take 1 tablet (20 mg total) by mouth 4 (four) times daily. 07/31/18  Yes Lawyer, Cristal Deer, PA-C  pantoprazole (PROTONIX) 40 MG tablet Take 40 mg by mouth 2 (two) times daily. 08/21/18  Yes [provider]  predniSONE (DELTASONE) 5 MG tablet Take 5 mg by mouth 3 (three) times daily.   Yes [provider]  valproic acid (DEPAKENE) 250 MG capsule Take 500 mg by mouth 3 (three) times daily.   Yes [provider]  feeding supplement, ENSURE ENLIVE, (ENSURE ENLIVE) LIQD Take 237 mLs by mouth 2 (two) times daily between meals. Patient not taking: Reported on 09/24/2018 11/10/15   Catarina Hartshorn, MD  gabapentin (NEURONTIN) 300 MG capsule Take 1 capsule (300 mg total) by mouth at bedtime. Patient not taking: Reported on 09/24/2018 05/13/18 09/24/26  Zigmund Daniel., MD  levETIRAcetam (KEPPRA) 500 MG tablet Take 1 tablet (500 mg total) by mouth 2 (two) times daily. Patient not taking: Reported on 09/24/2018 05/13/18 09/24/26  Zigmund Daniel., MD  oxyCODONE (OXY IR/ROXICODONE) 5 MG immediate release tablet Take 1-2 tablets (5-10 mg total) by mouth every 6 (six) hours as  needed for moderate pain or severe pain. Patient not taking: Reported on 07/31/2018 11/26/17   Rozann Lesches, MD  senna-docusate (SENOKOT-S) 8.6-50 MG tablet Take 2 tablets by mouth 2 (two) times daily. Patient not taking: Reported on 09/24/2018 11/26/17   Rozann Lesches, MD  tiZANidine (ZANAFLEX) 4 MG tablet Take 1 tablet (4 mg total) by mouth 4 (four) times daily as needed for muscle spasms. Patient not taking: Reported on 09/24/2018 11/26/17   Rozann Lesches, MD  Vitamin D, Ergocalciferol, (DRISDOL) 50000 UNITS CAPS capsule Take 1 capsule (50,000 Units total) by mouth every 7 (seven) days. Sunday Patient not taking: Reported on 09/24/2018 01/27/15   Calvert Cantor, MD    Family History Family History  Problem Relation Age of Onset  . Kidney failure Mother   . Hypertension Sister   . Hypertension Brother     Social History Social History   Tobacco Use  . Smoking status: Former Smoker    Last  attempt to quit: 2010    Years since quitting: 9.8  . Smokeless tobacco: Never Used  Substance Use Topics  . Alcohol use: No    Comment: admits to 2 drinks/week  . Drug use: No     Allergies   Ivp dye [iodinated diagnostic agents] and Metrizamide   Review of Systems Review of Systems  All other systems reviewed and are negative.    Physical Exam Updated Vital Signs BP (!) 89/66   Pulse (!) 117   Temp (!) 97.5 F (36.4 C) (Rectal)   Resp 19   Ht 5' (1.524 m)   Wt 59 kg   SpO2 95%   BMI 25.40 kg/m   Physical Exam  Constitutional: She is oriented to person, place, and time. She appears cachectic. She is sleeping.  Non-toxic appearance. No distress.  HENT:  Head: Normocephalic and atraumatic.  Nose: Nose normal.  Mouth/Throat: Oropharynx is clear and moist.  Eyes: Pupils are equal, round, and reactive to light. Conjunctivae are normal. Right eye exhibits no discharge. Left eye exhibits no discharge.  Neck: Normal range of motion. Neck supple.  Cardiovascular: Regular  rhythm, normal heart sounds and intact distal pulses. Exam reveals no gallop and no friction rub.  No murmur heard. Tenderness to just tedious tachycardia noted.  Pulmonary/Chest: Effort normal. She has wheezes.  Rhonchorous sounds in all lung fields.  Patient with mild increased work of breathing.  Mouth breathing.  Abdominal: Soft. Bowel sounds are normal. There is no tenderness. There is no rebound and no guarding.  Musculoskeletal: Normal range of motion. She exhibits no tenderness.  Lymphadenopathy:    She has no cervical adenopathy.  Neurological: She is alert and oriented to person, place, and time.  Oriented to person and place but not time.  Patient is somnolent but easily arousable.  She is arousable to verbal stimuli.  Skin: Skin is warm and dry. Capillary refill takes less than 2 seconds.  Psychiatric: Her behavior is normal. Judgment and thought content normal.  Nursing note and vitals reviewed.    ED Treatments / Results  Labs (all labs ordered are listed, but only abnormal results are displayed) Labs Reviewed  CBC WITH DIFFERENTIAL/PLATELET - Abnormal; Notable for the following components:      Result Value   RBC 3.36 (*)    Hemoglobin 8.8 (*)    HCT 27.4 (*)    RDW 18.4 (*)    All other components within normal limits  VALPROIC ACID LEVEL - Abnormal; Notable for the following components:   Valproic Acid Lvl 26 (*)    All other components within normal limits  LACTIC ACID, PLASMA - Abnormal; Notable for the following components:   Lactic Acid, Venous 4.0 (*)    All other components within normal limits  URINALYSIS, ROUTINE W REFLEX MICROSCOPIC - Abnormal; Notable for the following components:   Leukocytes, UA TRACE (*)    Bacteria, UA MANY (*)    All other components within normal limits  I-STAT CG4 LACTIC ACID, ED - Abnormal; Notable for the following components:   Lactic Acid, Venous 2.96 (*)    All other components within normal limits  I-STAT CHEM 8, ED -  Abnormal; Notable for the following components:   Potassium 5.5 (*)    Chloride 115 (*)    Glucose, Bld 112 (*)    Calcium, Ion 1.13 (*)    Hemoglobin 9.9 (*)    HCT 29.0 (*)    All other components within normal limits  I-STAT ARTERIAL BLOOD GAS, ED - Abnormal; Notable for the following components:   pH, Arterial 7.290 (*)    TCO2 21 (*)    Acid-base deficit 6.0 (*)    All other components within normal limits  I-STAT CG4 LACTIC ACID, ED - Abnormal; Notable for the following components:   Lactic Acid, Venous 3.38 (*)    All other components within normal limits  CULTURE, BLOOD (ROUTINE X 2)  CULTURE, BLOOD (ROUTINE X 2)  URINE CULTURE  RESPIRATORY PANEL BY PCR  MRSA PCR SCREENING  PROCALCITONIN  CBC  BASIC METABOLIC PANEL  MAGNESIUM  PHOSPHORUS  LACTIC ACID, PLASMA  PROCALCITONIN  CBG MONITORING, ED    EKG None  Radiology Dg Chest Portable 1 View  Result Date: 09/24/2018 CLINICAL DATA:  Lethargy. Recent hospitalization for pneumonia and sepsis. EXAM: PORTABLE CHEST 1 VIEW COMPARISON:  Chest CT 09/09/2018 and radiograph 09/06/2018 FINDINGS: The cardiac silhouette is upper limits of normal in size. There is mild chronic interstitial coarsening. The lungs are mildly hypoinflated with mild bilateral infrahilar opacity. No sizable pleural effusion or pneumothorax is identified. A well-defined lucent lesion in the proximal right humerus is chronic. There are degenerative changes at both shoulders. Right upper quadrant abdominal surgical clips and prior thoracic fusion are noted. IMPRESSION: Mild bilateral infrahilar opacities which may reflect atelectasis or pneumonia. Electronically Signed   By: Sebastian Ache M.D.   On: 09/24/2018 20:42    Procedures .Critical Care Performed by: Rise Mu, PA-C Authorized by: Rise Mu, PA-C   Critical care provider statement:    Critical care time (minutes):  60   Critical care was necessary to treat or prevent imminent  or life-threatening deterioration of the following conditions:  Sepsis   Critical care was time spent personally by me on the following activities:  Development of treatment plan with patient or surrogate, discussions with consultants, discussions with primary provider, evaluation of patient's response to treatment, examination of patient, obtaining history from patient or surrogate, ordering and performing treatments and interventions, ordering and review of laboratory studies, ordering and review of radiographic studies, pulse oximetry, re-evaluation of patient's condition and review of old charts   (including critical care time)  Medications Ordered in ED Medications  vancomycin (VANCOCIN) 500 mg in sodium chloride 0.9 % 100 mL IVPB (has no administration in time range)  lactated ringers infusion ( Intravenous New Bag/Given 09/25/18 0057)  heparin injection 5,000 Units (5,000 Units Subcutaneous Given 09/25/18 0101)  calcium gluconate 1 g/ 50 mL sodium chloride IVPB (1,000 mg Intravenous New Bag/Given 09/25/18 0100)  hydrocortisone sodium succinate (SOLU-CORTEF) 100 MG injection 50 mg (has no administration in time range)  valproic acid (DEPAKENE) 250 MG capsule 500 mg (500 mg Oral Given 09/25/18 0125)  atorvastatin (LIPITOR) tablet 10 mg (has no administration in time range)  pantoprazole (PROTONIX) EC tablet 40 mg (40 mg Oral Given 09/25/18 0139)  piperacillin-tazobactam (ZOSYN) IVPB 3.375 g (has no administration in time range)  piperacillin-tazobactam (ZOSYN) IVPB 3.375 g (0 g Intravenous Stopped 09/24/18 2116)  vancomycin (VANCOCIN) IVPB 1000 mg/200 mL premix (0 mg Intravenous Stopped 09/24/18 2138)  hydrocortisone sodium succinate (SOLU-CORTEF) 100 MG injection 100 mg (100 mg Intravenous Given 09/24/18 2032)  sodium chloride 0.9 % bolus 1,000 mL (0 mLs Intravenous Stopped 09/24/18 2139)  sodium chloride 0.9 % bolus 500 mL (0 mLs Intravenous Stopped 09/24/18 2207)  sodium chloride 0.9 % bolus 1,000  mL (0 mLs Intravenous Stopped 09/25/18 0029)     Initial Impression /  Assessment and Plan / ED Course  I have reviewed the triage vital signs and the nursing notes.  Pertinent labs & imaging results that were available during my care of the patient were reviewed by me and considered in my medical decision making (see chart for details).     Patient presents to the ED with complex past medical history as documented above.  She comes in for altered mental status and increased shortness of breath and cough.  Patient noted be tachycardic and hypotensive on arrival to the ED.  She was afebrile.  Patient was mildly altered and somnolent but easily arousable to verbal stimuli.  Lungs with rhonchorous sounds throughout with mild expiratory wheezes.  Tachycardia noted.  Neurovascularly intact.  Labs show no leukocytosis.  Hemoglobin does appear at patient's baseline and improved from her prior anemia with transfusion last week.  Patient has no significant electrolyte derangement.  Normal anion gap.  Lactic acid initially elevated 2.9 and increased to 3.4.  Patient was given 30 cc/kg fluid bolus.  She was also given Solu-Cortef given that she is on daily prednisone.  Patient remained hypotensive despite 2.5 L of IV fluid.  She was started on Vanco and Zosyn for hospital-acquired pneumonia.  Blood cultures and urine cultures are pending at this time.  Patient's blood gas does show an acidosis of 7.2 appears to be a metabolic acidosis but is compensated.  PO2 and CO2 are at baseline.  Chest x-ray was reviewed myself and does show possible perihilar bilateral opacities versus atelectasis.  Code sepsis was initiated.  I spoke with Dr. Levada Schilling with critical care who would like an additional liter of fluid given but no indication for IV pressors at this time.  Patient's map is staying in the low 70s.  Family was updated on plan of care.  Will be admitted to critical care for further work-up.  Patient seen by my attending  Dr. Madilyn Hook who is agreed with the plan.  Final Clinical Impressions(s) / ED Diagnoses   Final diagnoses:  Septic shock (HCC)  Altered mental status, unspecified altered mental status type    ED Discharge Orders    None       [name] Keller 09/25/18 1213    Tilden Fossa, MD 09/30/18 [phone]

## 2018-09-25 NOTE — Progress Notes (Signed)
NAME:  Amber [name], MRN:  [id number], DOB:  03-21-1956, LOS: 1 ADMISSION DATE:  09/24/2018, CONSULTATION DATE:  09/24/2018 REFERRING MD:  Madilyn Hook, CHIEF COMPLAINT:  AMS, Hypotension  Brief History   Amber [name] is a 62 y.o. female who was admitted 11/5 with hypotension and AMS.  Had recent admission to [facility] for CAP and anemia due to GI bleed (was on eliquis for PAF which was held during this admission and d/c'd after discussing the risk of rebleed).  Past Medical History  Seizures, CVA, HTN, Anemia, GERD, GI bleed, arthritis. Significant Hospital Events   11/5 > admit.  Consults: date of consult/date signed off & final recs:  none  Procedures (surgical and bedside):  none  Significant Diagnostic Tests:  CXR 11/5 > mild b/l infrahilar opacities. Echo 11/6 > pending  Micro Data:  Blood 11/5 > NG at 24 hours Urine 11/5 > penidng RVP 11/5 > negative  Antimicrobials:  Vancomycin 11/5- Zosyn 11/5- Subjective:  Awake, alert. No acute distress. AO x2 (self, place). Is hungry. Feeling pretty cold.  Objective   Blood pressure 90/71, pulse 83, temperature (!) 93.9 F (34.4 C), temperature source Rectal, resp. rate 10, height 5' (1.524 m), weight 42.7 kg, SpO2 100 %.        Intake/Output Summary (Last 24 hours) at 09/25/2018 0949 Last data filed at 09/25/2018 0700 Gross per 24 hour  Intake 3481.23 ml  Output 550 ml  Net 2931.23 ml   Filed Weights   09/24/18 1951 09/25/18 0452  Weight: 59 kg 42.7 kg    Examination: General: 62 year old AA female in no acute distress. Covered with several layers of covers and has bear hugger HENT: mmm, [place]/AT Lungs: no acute distress, CTA bilaterally. No accessory muscle use Cardiovascular: rrr, no m/r/g. Abdomen: soft, non-tender, non-distended Extremities: able to move all extremities Neuro: cn 2-12 intact, no neuro deficit GU: external urinary catheter in place  Resolved Hospital Problem list   none  Assessment &  Plan:  62 year old who presents with AMS and hypotension. Recently admitted to [place] for CAP. Patient with persistently elevated lactic acid even status post 3L volume resuscitation. Doing much better from mental status standpoint, remaining intermittently hypotensive.  HCAP - continue vancomycin (11/5- ) - Continue Zosyn (11/5- ) - monitor blood cx - trend lactic acid  Hypotension - S/P 3.5 L of fluid - LR @ 51mL/hr - repeat LA, if still elevated will give 1 more liter - f/u echo - solucortef 50mg  q 6 hours  Hypocalcemia Ca 7.5 Albumin unknown. S/P 1g Ca gluconate. - CMP in am - replete as needed  Anemia - chronic. - Transfuse for Hgb < 7.  Hx seizures. - Continue preadmission valproic acid.  Failure to thrive - per notes from [facility], had 5 admissions this year so far.  Discussed goals of care with family who have opted to continue with full code status. - PT efforts.  Hx HTN, HLD, PAF (previously on eliquis, d/c'd after last admission to [facility] due to GI bleed). - Continue preadmission atorvastatin. - Hold preadmission toprol-xl.   Disposition / Summary of Today's Plan 09/25/18   Continue broad spectrum abx Trend lactic acid Stop solucortef and start PO home dose steroids as able     Diet: CLD Pain/Anxiety/Delirium protocol (if indicated): home oxycodone VAP protocol (if indicated): in place DVT prophylaxis: heparin GI prophylaxis: protonix Hyperglycemia protocol: N/A Mobility: bedbound Code Status: full Family Communication: none at bedside  Labs   CBC: Recent Labs  Lab 09/24/18 1924 09/24/18 1939 09/25/18 0339  WBC 7.6  --  7.3  NEUTROABS 3.6  --   --   HGB 8.8* 9.9* 8.0*  HCT 27.4* 29.0* 25.5*  MCV 81.5  --  83.3  PLT PLATELET CLUMPS NOTED ON SMEAR, COUNT APPEARS DECREASED  --  81*    Basic Metabolic Panel: Recent Labs  Lab 09/24/18 1939 09/25/18 0339  NA 142 143  K 5.5* 4.0  CL 115* 121*  CO2  --  15*    GLUCOSE 112* 142*  BUN 14 10  CREATININE 0.90 0.91  CALCIUM  --  7.5*  MG  --  1.4*  PHOS  --  3.9   GFR: Estimated Creatinine Clearance: 44.3 mL/min (by C-G formula based on SCr of 0.91 mg/dL). Recent Labs  Lab 09/24/18 1924 09/24/18 1935 09/24/18 2319 09/24/18 2325 09/24/18 2331 09/25/18 0339  PROCALCITON  --   --   --   --  0.18 0.60  WBC 7.6  --   --   --   --  7.3  LATICACIDVEN  --  2.96* 4.0* 3.38*  --  5.8*    Liver Function Tests: No results for input(s): AST, ALT, ALKPHOS, BILITOT, PROT, ALBUMIN in the last 168 hours. No results for input(s): LIPASE, AMYLASE in the last 168 hours. No results for input(s): AMMONIA in the last 168 hours.  ABG    Component Value Date/Time   PHART 7.290 (L) 09/24/2018 1959   PCO2ART 42.0 09/24/2018 1959   PO2ART 87.0 09/24/2018 1959   HCO3 20.2 09/24/2018 1959   TCO2 21 (L) 09/24/2018 1959   ACIDBASEDEF 6.0 (H) 09/24/2018 1959   O2SAT 95.0 09/24/2018 1959     Coagulation Profile: No results for input(s): INR, PROTIME in the last 168 hours.  Cardiac Enzymes: No results for input(s): CKTOTAL, CKMB, CKMBINDEX, TROPONINI in the last 168 hours.  HbA1C: Hgb A1c MFr Bld  Date/Time Value Ref Range Status  05/08/2018 11:14 AM 5.7 (H) 4.8 - 5.6 % Final    Comment:    (NOTE) Pre diabetes:          5.7%-6.4% Diabetes:              >6.4% Glycemic control for   <7.0% adults with diabetes   10/27/2016 05:32 AM 4.7 (L) 4.8 - 5.6 % Final    Comment:    (NOTE)         Pre-diabetes: 5.7 - 6.4         Diabetes: >6.4         Glycemic control for adults with diabetes: <7.0     CBG: Recent Labs  Lab 09/24/18 1908  GLUCAP 92    Admitting History of Present Illness.   Amber [name] is a 62 y.o. female who has a PMH as outlined below.  She presented to [address] ED 11/5 with AMS and generalized weakness.  Per family, she was recently hospitalized at [facility] from 10/17 through 10/31 for CAP and anemia due to GI bleed (was on  eliquis for PAF which was held during this admission and d/c'd after discussing the risk of rebleed).  She was discharged home and had been doing well up until morning of 11/5 when she had generalized weakness followed by AMS.  Family subsequently brought her to ED for further evaluation.  In ED, she was found to have hypotension.  She was given 30cc/kg with minimal improvement in BP.  PCCM was subsequently called for admission.  Review of Systems:   Unable to obtain on admission as pt was encephalopathic.  Past Medical History  She,  has a past medical history of ACS (acute coronary syndrome) (HCC) (06/12/2015), Acute on chronic blood loss anemia (11/25/2017), Arthritis, Asthma, Cerebrovascular accident (CVA) due to embolism (HCC) (10/26/2016), Chronic pain, Constipation, Diabetes mellitus without complication (HCC), Duodenitis, Embolic stroke (HCC), Falls frequently, Focal seizure (HCC), GERD (gastroesophageal reflux disease), Hypertension, Impacted fracture (02/15/2017), Pneumonia (10/2015), Seizures (HCC), and Small bowel obstruction (HCC) (11/25/2017).   Surgical History    Past Surgical History:  Procedure Laterality Date  . APPENDECTOMY    . BACK SURGERY    . CHOLECYSTECTOMY    . TEE WITHOUT CARDIOVERSION N/A 10/30/2016   Procedure: TRANSESOPHAGEAL ECHOCARDIOGRAM (TEE);  Surgeon: Lars Masson, MD;  Location: [facility] ENDOSCOPY;  Service: Cardiovascular;  Laterality: N/A;     Social History   Social History   Socioeconomic History  . Marital status: Single    Spouse name: Not on file  . Number of children: Not on file  . Years of education: Not on file  . Highest education level: Not on file  Occupational History  . Not on file  Social Needs  . Financial resource strain: Not on file  . Food insecurity:    Worry: Not on file    Inability: Not on file  . Transportation needs:    Medical: Not on file    Non-medical: Not on file  Tobacco Use  . Smoking status: Former Smoker     Last attempt to quit: 2010    Years since quitting: 9.8  . Smokeless tobacco: Never Used  Substance and Sexual Activity  . Alcohol use: No    Comment: admits to 2 drinks/week  . Drug use: No  . Sexual activity: Never  Lifestyle  . Physical activity:    Days per week: Not on file    Minutes per session: Not on file  . Stress: Not on file  Relationships  . Social connections:    Talks on phone: Not on file    Gets together: Not on file    Attends religious service: Not on file    Active member of club or organization: Not on file    Attends meetings of clubs or organizations: Not on file    Relationship status: Not on file  . Intimate partner violence:    Fear of current or ex partner: Not on file    Emotionally abused: Not on file    Physically abused: Not on file    Forced sexual activity: Not on file  Other Topics Concern  . Not on file  Social History Narrative  . Not on file  ,  reports that she quit smoking about 9 years ago. She has never used smokeless tobacco. She reports that she does not drink alcohol or use drugs.   Family History   Her family history includes Hypertension in her brother and sister; Kidney failure in her mother.   Allergies Allergies  Allergen Reactions  . Ivp Dye [Iodinated Diagnostic Agents] Itching  . Metrizamide Itching     Home Medications  Prior to Admission medications   Medication Sig Start Date End Date Taking? Authorizing Provider  allopurinol (ZYLOPRIM) 300 MG tablet Take 300 mg by mouth daily. 11/11/16  Yes [provider]  atorvastatin (LIPITOR) 10 MG tablet Take 10 mg by mouth daily at 6 PM.  08/13/15  Yes [provider]  ferrous sulfate 325 (65 FE) MG tablet Take 1 tablet (325 mg total) by mouth daily with breakfast. 11/27/17  Yes Nedrud, Jeanella Flattery, MD  metoprolol succinate (TOPROL-XL) 25 MG 24 hr tablet Take 25 mg by mouth daily.   Yes [provider]  Oxycodone HCl 20 MG TABS Take 1 tablet (20 mg  total) by mouth 4 (four) times daily. 07/31/18  Yes Lawyer, Cristal Deer, PA-C  pantoprazole (PROTONIX) 40 MG tablet Take 40 mg by mouth 2 (two) times daily. 08/21/18  Yes [provider]  predniSONE (DELTASONE) 5 MG tablet Take 5 mg by mouth 3 (three) times daily.   Yes [provider]  valproic acid (DEPAKENE) 250 MG capsule Take 500 mg by mouth 3 (three) times daily.   Yes [provider]  feeding supplement, ENSURE ENLIVE, (ENSURE ENLIVE) LIQD Take 237 mLs by mouth 2 (two) times daily between meals. Patient not taking: Reported on 09/24/2018 11/10/15   Catarina Hartshorn, MD  gabapentin (NEURONTIN) 300 MG capsule Take 1 capsule (300 mg total) by mouth at bedtime. Patient not taking: Reported on 09/24/2018 05/13/18 09/24/26  Zigmund Daniel., MD  levETIRAcetam (KEPPRA) 500 MG tablet Take 1 tablet (500 mg total) by mouth 2 (two) times daily. Patient not taking: Reported on 09/24/2018 05/13/18 09/24/26  Zigmund Daniel., MD  oxyCODONE (OXY IR/ROXICODONE) 5 MG immediate release tablet Take 1-2 tablets (5-10 mg total) by mouth every 6 (six) hours as needed for moderate pain or severe pain. Patient not taking: Reported on 07/31/2018 11/26/17   Rozann Lesches, MD  senna-docusate (SENOKOT-S) 8.6-50 MG tablet Take 2 tablets by mouth 2 (two) times daily. Patient not taking: Reported on 09/24/2018 11/26/17   Rozann Lesches, MD  tiZANidine (ZANAFLEX) 4 MG tablet Take 1 tablet (4 mg total) by mouth 4 (four) times daily as needed for muscle spasms. Patient not taking: Reported on 09/24/2018 11/26/17   Rozann Lesches, MD  Vitamin D, Ergocalciferol, (DRISDOL) 50000 UNITS CAPS capsule Take 1 capsule (50,000 Units total) by mouth every 7 (seven) days. Sunday Patient not taking: Reported on 09/24/2018 01/27/15   Calvert Cantor, MD     Critical care time:

## 2018-09-25 NOTE — Progress Notes (Signed)
CRITICAL VALUE ALERT  Critical Value:  5.8 lactic acid  Date & Time Notied:  11/06 0500  Provider Notified: Arsenio Loader   Orders Received/Actions taken: 1000 mL NS bolus; see orders

## 2018-09-25 NOTE — Progress Notes (Signed)
CRITICAL VALUE ALERT  Critical Value:  Lactic 4.7  Date & Time Notied:  09/25/18 1311  Provider Notified: Yes  Orders Received/Actions taken: Awaiting orders at this time

## 2018-09-25 NOTE — Progress Notes (Signed)
Initial Nutrition Assessment  DOCUMENTATION CODES:   Underweight  INTERVENTION:    Diet advancement as able.  RD to add PO supplements when diet is advanced.   NUTRITION DIAGNOSIS:   Inadequate oral intake related to inability to eat as evidenced by NPO status.  GOAL:   Patient will meet greater than or equal to 90% of their needs  MONITOR:   Diet advancement, PO intake, Skin  REASON FOR ASSESSMENT:   Low Braden    ASSESSMENT:   62 yo female with PMH of asthma, HTN, GERD, constipation, DM, chronic pain, frequent falls, CVA who was admitted on 11/5 with hypotension and AMS.  Patient sleeping and under multiple blankets, including baer hugger. Difficult for RD to assess muscle and fat mass in all areas. Patient was unable to provide any nutrition hx during RD visit. Patient has a history of failure to thrive. On previous admissions she drank Boost Breeze and [employer] supplements.  Labs reviewed. Magnesium 1.4 (L) Medications reviewed and include Solu-cortef, magnesium sulfate IV, vancomycin IV.  Weight 43.9 kg (05/07/18) to 42.7 kg (today). No significant weight loss noted.   NUTRITION - FOCUSED PHYSICAL EXAM:    Most Recent Value  Orbital Region  No depletion  Upper Arm Region  Unable to assess  Thoracic and Lumbar Region  Unable to assess  Buccal Region  No depletion  Temple Region  Unable to assess  Clavicle Bone Region  Mild depletion  Clavicle and Acromion Bone Region  No depletion  Scapular Bone Region  Unable to assess  Dorsal Hand  No depletion  Patellar Region  Unable to assess  Anterior Thigh Region  Unable to assess  Posterior Calf Region  Unable to assess  Edema (RD Assessment)  Unable to assess  Hair  Reviewed  Eyes  Unable to assess  Mouth  Unable to assess  Skin  Reviewed  Nails  Reviewed       Diet Order:   Diet Order            Diet NPO time specified  Diet effective now              EDUCATION NEEDS:   No education needs  have been identified at this time  Skin:  Skin Assessment: (MASD to buttocks, back, groin, labia)  Last BM:  11/5  Height:   Ht Readings from Last 1 Encounters:  09/24/18 5' (1.524 m)    Weight:   Wt Readings from Last 1 Encounters:  09/25/18 42.7 kg    Ideal Body Weight:  45.5 kg  BMI:  Body mass index is 18.38 kg/m.  Estimated Nutritional Needs:   Kcal:  1100-1300  Protein:  55-65 gm  Fluid:  >/= 1.3 L    Joaquin Courts, RD, LDN, CNSC Pager [phone] After Hours Pager [phone]

## 2018-09-26 ENCOUNTER — Inpatient Hospital Stay (HOSPITAL_COMMUNITY): Payer: Medicaid Other

## 2018-09-26 DIAGNOSIS — R4182 Altered mental status, unspecified: Secondary | ICD-10-CM

## 2018-09-26 LAB — CBC WITH DIFFERENTIAL/PLATELET
ABS IMMATURE GRANULOCYTES: 0.06 10*3/uL (ref 0.00–0.07)
BASOS ABS: 0 10*3/uL (ref 0.0–0.1)
Basophils Relative: 0 %
EOS PCT: 0 %
Eosinophils Absolute: 0 10*3/uL (ref 0.0–0.5)
HCT: 17.8 % — ABNORMAL LOW (ref 36.0–46.0)
Hemoglobin: 5.9 g/dL — CL (ref 12.0–15.0)
Immature Granulocytes: 1 %
LYMPHS PCT: 20 %
Lymphs Abs: 1.3 10*3/uL (ref 0.7–4.0)
MCH: 26.7 pg (ref 26.0–34.0)
MCHC: 33.1 g/dL (ref 30.0–36.0)
MCV: 80.5 fL (ref 80.0–100.0)
Monocytes Absolute: 0.2 10*3/uL (ref 0.1–1.0)
Monocytes Relative: 3 %
NEUTROS ABS: 4.8 10*3/uL (ref 1.7–7.7)
NRBC: 0 % (ref 0.0–0.2)
Neutrophils Relative %: 76 %
PLATELETS: 72 10*3/uL — AB (ref 150–400)
RBC: 2.21 MIL/uL — ABNORMAL LOW (ref 3.87–5.11)
RDW: 18.3 % — ABNORMAL HIGH (ref 11.5–15.5)
WBC: 6.3 10*3/uL (ref 4.0–10.5)

## 2018-09-26 LAB — COMPREHENSIVE METABOLIC PANEL
ALBUMIN: 1.1 g/dL — AB (ref 3.5–5.0)
ALK PHOS: 97 U/L (ref 38–126)
ALT: 11 U/L (ref 0–44)
ANION GAP: 4 — AB (ref 5–15)
AST: 25 U/L (ref 15–41)
BUN: 10 mg/dL (ref 6–20)
CHLORIDE: 124 mmol/L — AB (ref 98–111)
CO2: 17 mmol/L — ABNORMAL LOW (ref 22–32)
Calcium: 7.1 mg/dL — ABNORMAL LOW (ref 8.9–10.3)
Creatinine, Ser: 1.05 mg/dL — ABNORMAL HIGH (ref 0.44–1.00)
GFR calc Af Amer: >60 mL/min (ref >60)
GFR calc non Af Amer: 57 mL/min — ABNORMAL LOW (ref >60)
Glucose, Bld: 138 mg/dL — ABNORMAL HIGH (ref 70–99)
POTASSIUM: 3.7 mmol/L (ref 3.5–5.1)
SODIUM: 145 mmol/L (ref 135–145)
Total Bilirubin: 0.4 mg/dL (ref 0.3–1.2)
Total Protein: 4.2 g/dL — ABNORMAL LOW (ref 6.5–8.1)

## 2018-09-26 LAB — PROCALCITONIN: Procalcitonin: 1.38 ng/mL

## 2018-09-26 LAB — PREPARE RBC (CROSSMATCH)

## 2018-09-26 LAB — HEMOGLOBIN AND HEMATOCRIT, BLOOD
HCT: 29 % — ABNORMAL LOW (ref 36.0–46.0)
Hemoglobin: 9.5 g/dL — ABNORMAL LOW (ref 12.0–15.0)

## 2018-09-26 LAB — MAGNESIUM: Magnesium: 1.8 mg/dL (ref 1.7–2.4)

## 2018-09-26 LAB — LACTIC ACID, PLASMA: Lactic Acid, Venous: 2.7 mmol/L (ref 0.5–1.9)

## 2018-09-26 MED ORDER — PANTOPRAZOLE SODIUM 40 MG IV SOLR
40.0000 mg | Freq: Two times a day (BID) | INTRAVENOUS | Status: DC
  Administered 2018-09-26 (×2): 40 mg via INTRAVENOUS
  Filled 2018-09-26 (×3): qty 40

## 2018-09-26 MED ORDER — PREDNISONE 5 MG PO TABS
5.0000 mg | ORAL_TABLET | Freq: Three times a day (TID) | ORAL | Status: DC
  Filled 2018-09-26: qty 1

## 2018-09-26 MED ORDER — VALPROATE SODIUM 500 MG/5ML IV SOLN
500.0000 mg | Freq: Three times a day (TID) | INTRAVENOUS | Status: DC
  Administered 2018-09-26 – 2018-09-27 (×3): 500 mg via INTRAVENOUS
  Filled 2018-09-26 (×4): qty 5

## 2018-09-26 MED ORDER — SODIUM CHLORIDE 0.9% IV SOLUTION: Freq: Once | INTRAVENOUS | Status: DC

## 2018-09-26 NOTE — Care Management Note (Signed)
Case Management Note  Patient Details  Name: Braelee Herrle MRN: [id number] Date of Birth: November 06, 1956  Subjective/Objective:      Pt admitted with septic shock           Action/Plan:   PTA from home with son.  Pt was unable to communicate with CM - CM discussed PTA status with son.  Per son;  Pt was independent from home with limited mobility.  Pt has PCP and denied barriers with paying for medications.  Pt has aide that comes into the home during the week for approximately 2-3 hours a day.  PT eval ordered   Expected Discharge Date:                  Expected Discharge Plan:  Home w Home Health Services  In-House Referral:     Discharge planning Services  CM Consult  Post Acute Care Choice:    Choice offered to:     DME Arranged:    DME Agency:     HH Arranged:    HH Agency:     Status of Service:  In process, will continue to follow  If discussed at Long Length of Stay Meetings, dates discussed:    Additional Comments:  Cherylann Parr, RN 09/26/2018, 8:21 AM

## 2018-09-26 NOTE — Plan of Care (Signed)
  Problem: Clinical Measurements: Goal: Will remain free from infection Outcome: Progressing Goal: Diagnostic test results will improve Outcome: Progressing Goal: Respiratory complications will improve Outcome: Progressing Goal: Cardiovascular complication will be avoided Outcome: Progressing   Problem: Nutrition: Goal: Adequate nutrition will be maintained Outcome: Progressing   Problem: Coping: Goal: Level of anxiety will decrease Outcome: Progressing   Problem: Pain Managment: Goal: General experience of comfort will improve Outcome: Progressing   Problem: Safety: Goal: Ability to remain free from injury will improve Outcome: Progressing   Problem: Activity: Goal: Risk for activity intolerance will decrease Outcome: Not Progressing

## 2018-09-26 NOTE — Progress Notes (Signed)
EEG complete - results pending 

## 2018-09-26 NOTE — Progress Notes (Signed)
NAME:  Amber [name], MRN:  [id number], DOB:  1955-12-26, LOS: 2 ADMISSION DATE:  09/24/2018, CONSULTATION DATE:  09/24/2018 REFERRING MD:  Madilyn Hook, CHIEF COMPLAINT:  AMS, Hypotension  Brief History   Chrystine Frogge is a 62 y.o. female who was admitted 11/5 with hypotension and AMS.  Had recent admission to [facility] for CAP and anemia due to GI bleed (was on eliquis for PAF which was held during this admission and d/c'd after discussing the risk of rebleed).  Past Medical History  Seizures, CVA, HTN, Anemia, GERD, GI bleed, arthritis. Significant Hospital Events   11/5 > admit. 11/7 1uprbc  Consults: date of consult/date signed off & final recs:  none  Procedures (surgical and bedside):  none  Significant Diagnostic Tests:  CXR 11/5 > mild b/l infrahilar opacities. Echo 11/6 > EF 65-70%, G1DD  Micro Data:  Blood 11/5 > NG at 24 hours Urine 11/5 > penidng RVP 11/5 > negative  Antimicrobials:  Vancomycin 11/5- Zosyn 11/5-  Subjective:  Less awake, alert. Able to arouse briefly but then goes back to sleep and follow commands. Non-verbal.  Overnight: hypotensive, LR 1L bolus and 1uprbc  Objective   Blood pressure (!) 111/92, pulse 92, temperature 98.8 F (37.1 C), temperature source Axillary, resp. rate 18, height 5' (1.524 m), weight 42.7 kg, SpO2 100 %.        Intake/Output Summary (Last 24 hours) at 09/26/2018 0856 Last data filed at 09/26/2018 0800 Gross per 24 hour  Intake 2347.04 ml  Output 325 ml  Net 2022.04 ml   Filed Weights   09/24/18 1951 09/25/18 0452  Weight: 59 kg 42.7 kg    Examination: General: 62 year old AA female. Difficult to arouse this am. Opens eyes to yelling, able to follow commands HEENT: mmm, [place]/AT Lungs: CTAB. No accessory muscle use Cardiovascular: regular rate and rythm, no murmurs/rubs/gallops. 3+ pitting edema BUE. Trace edema bilateral lower extremity Abdomen: soft, non-tender, non-distended Extremities: able to move  all extremities Neuro: cn 2-12 intact, no neuro deficit GU: external urinary catheter in place Neuro: rhythmic smacking of lips, difficult to arouse, follows commands, PERRL  Resolved Hospital Problem list   none  Assessment & Plan:  62 year old who presents with AMS and hypotension. Recently admitted to [place] for CAP. Improving lactic acid. Became less responsive and hypotensive overnight. Given 1L LR bolus and 1upRBC. Unclear etiology. Patient with rhythmic smacking of lips. More consistent with tardive dyskinesia but cannot rule out seizure or stroke. Possible hypercapnia so could consider ABG as well.  HCAP - continue vancomycin (11/5- ) - Continue Zosyn (11/5- ) - monitor blood cx - trend lactic acid, procal  Altered Mental Status - consider ABG, EEG, ct head - continue depakote 500mg  tid  Hypotension - 1L LR o/n - receiving 1 uprbc - LR @ 32mL/hr - improving LA - solucortef 50mg  q 6 hours  Hypocalcemia Ca 7.5 Albumin unknown. S/P 1g Ca gluconate. - CMP in am - replete as needed  Anemia hgb 5.7 am of 11/7 - receiving 1u prbc  Hx seizures. - Continue preadmission valproic acid.  Failure to thrive - per notes from [facility], had 5 admissions this year so far.  Discussed goals of care with family who have opted to continue with full code status. - PT efforts.  Hx HTN, HLD, PAF (previously on eliquis, d/c'd after last admission to [facility] due to GI bleed). - Continue preadmission atorvastatin. - Hold preadmission toprol-xl.  Disposition / Summary of Today's Plan 09/26/18   Continue broad spectrum abx abg and EEG to eval for AMS 1u prbc, repeat h&h in PM Change to NPO for now Hold Heparin      Diet: NPO Pain/Anxiety/Delirium protocol (if indicated): home oxycodone VAP protocol (if indicated): in place DVT prophylaxis: hold heparin, SCDs GI prophylaxis: protonix Hyperglycemia protocol: N/A Mobility: bedbound Code Status:  full Family Communication: none at bedside  Labs   CBC: Recent Labs  Lab 09/24/18 1924 09/24/18 1939 09/25/18 0339 09/26/18 0304  WBC 7.6  --  7.3 6.3  NEUTROABS 3.6  --   --  4.8  HGB 8.8* 9.9* 8.0* 5.9*  HCT 27.4* 29.0* 25.5* 17.8*  MCV 81.5  --  83.3 80.5  PLT PLATELET CLUMPS NOTED ON SMEAR, COUNT APPEARS DECREASED  --  81* 72*    Basic Metabolic Panel: Recent Labs  Lab 09/24/18 1939 09/25/18 0339 09/26/18 0304  NA 142 143 145  K 5.5* 4.0 3.7  CL 115* 121* 124*  CO2  --  15* 17*  GLUCOSE 112* 142* 138*  BUN 14 10 10   CREATININE 0.90 0.91 1.05*  CALCIUM  --  7.5* 7.1*  MG  --  1.4* 1.8  PHOS  --  3.9  --    GFR: Estimated Creatinine Clearance: 38.4 mL/min (A) (by C-G formula based on SCr of 1.05 mg/dL (H)). Recent Labs  Lab 09/24/18 1924  09/24/18 2331 09/25/18 0339 09/25/18 1047 09/25/18 1411 09/26/18 0304  PROCALCITON  --   --  0.18 0.60  --   --  1.38  WBC 7.6  --   --  7.3  --   --  6.3  LATICACIDVEN  --    < >  --  5.8* 4.7* 3.9* 2.7*   < > = values in this interval not displayed.    Liver Function Tests: Recent Labs  Lab 09/26/18 0304  AST 25  ALT 11  ALKPHOS 97  BILITOT 0.4  PROT 4.2*  ALBUMIN 1.1*   No results for input(s): LIPASE, AMYLASE in the last 168 hours. No results for input(s): AMMONIA in the last 168 hours.  ABG    Component Value Date/Time   PHART 7.290 (L) 09/24/2018 1959   PCO2ART 42.0 09/24/2018 1959   PO2ART 87.0 09/24/2018 1959   HCO3 20.2 09/24/2018 1959   TCO2 21 (L) 09/24/2018 1959   ACIDBASEDEF 6.0 (H) 09/24/2018 1959   O2SAT 95.0 09/24/2018 1959     Coagulation Profile: No results for input(s): INR, PROTIME in the last 168 hours.  Cardiac Enzymes: No results for input(s): CKTOTAL, CKMB, CKMBINDEX, TROPONINI in the last 168 hours.  HbA1C: Hgb A1c MFr Bld  Date/Time Value Ref Range Status  05/08/2018 11:14 AM 5.7 (H) 4.8 - 5.6 % Final    Comment:    (NOTE) Pre diabetes:          5.7%-6.4% Diabetes:               >6.4% Glycemic control for   <7.0% adults with diabetes   10/27/2016 05:32 AM 4.7 (L) 4.8 - 5.6 % Final    Comment:    (NOTE)         Pre-diabetes: 5.7 - 6.4         Diabetes: >6.4         Glycemic control for adults with diabetes: <7.0     CBG: Recent Labs  Lab 09/24/18 1908 09/25/18 2141  GLUCAP 92 149*  Admitting History of Present Illness.   Cresencia Amsberry is a 62 y.o. female who has a PMH as outlined below.  She presented to [facility] ED 11/5 with AMS and generalized weakness.  Per family, she was recently hospitalized at [facility] from 10/17 through 10/31 for CAP and anemia due to GI bleed (was on eliquis for PAF which was held during this admission and d/c'd after discussing the risk of rebleed).  She was discharged home and had been doing well up until morning of 11/5 when she had generalized weakness followed by AMS.  Family subsequently brought her to ED for further evaluation.  In ED, she was found to have hypotension.  She was given 30cc/kg with minimal improvement in BP.  PCCM was subsequently called for admission.  Review of Systems:   Unable to obtain on admission as pt was encephalopathic.  Past Medical History  She,  has a past medical history of ACS (acute coronary syndrome) (HCC) (06/12/2015), Acute on chronic blood loss anemia (11/25/2017), Arthritis, Asthma, Cerebrovascular accident (CVA) due to embolism (HCC) (10/26/2016), Chronic pain, Constipation, Diabetes mellitus without complication (HCC), Duodenitis, Embolic stroke (HCC), Falls frequently, Focal seizure (HCC), GERD (gastroesophageal reflux disease), Hypertension, Impacted fracture (02/15/2017), Pneumonia (10/2015), Seizures (HCC), and Small bowel obstruction (HCC) (11/25/2017).   Surgical History    Past Surgical History:  Procedure Laterality Date  . APPENDECTOMY    . BACK SURGERY    . CHOLECYSTECTOMY    . TEE WITHOUT CARDIOVERSION N/A 10/30/2016   Procedure: TRANSESOPHAGEAL  ECHOCARDIOGRAM (TEE);  Surgeon: Lars Masson, MD;  Location: [facility] ENDOSCOPY;  Service: Cardiovascular;  Laterality: N/A;     Social History   Social History   Socioeconomic History  . Marital status: Single    Spouse name: Not on file  . Number of children: Not on file  . Years of education: Not on file  . Highest education level: Not on file  Occupational History  . Not on file  Social Needs  . Financial resource strain: Not on file  . Food insecurity:    Worry: Not on file    Inability: Not on file  . Transportation needs:    Medical: Not on file    Non-medical: Not on file  Tobacco Use  . Smoking status: Former Smoker    Last attempt to quit: 2010    Years since quitting: 9.8  . Smokeless tobacco: Never Used  Substance and Sexual Activity  . Alcohol use: No    Comment: admits to 2 drinks/week  . Drug use: No  . Sexual activity: Never  Lifestyle  . Physical activity:    Days per week: Not on file    Minutes per session: Not on file  . Stress: Not on file  Relationships  . Social connections:    Talks on phone: Not on file    Gets together: Not on file    Attends religious service: Not on file    Active member of club or organization: Not on file    Attends meetings of clubs or organizations: Not on file    Relationship status: Not on file  . Intimate partner violence:    Fear of current or ex partner: Not on file    Emotionally abused: Not on file    Physically abused: Not on file    Forced sexual activity: Not on file  Other Topics Concern  . Not on file  Social History Narrative  . Not on file  ,  reports that she quit smoking about 9 years ago. She has never used smokeless tobacco. She reports that she does not drink alcohol or use drugs.   Family History   Her family history includes Hypertension in her brother and sister; Kidney failure in her mother.   Allergies Allergies  Allergen Reactions  . Ivp Dye [Iodinated Diagnostic Agents] Itching  .  Metrizamide Itching     Home Medications  Prior to Admission medications   Medication Sig Start Date End Date Taking? Authorizing Provider  allopurinol (ZYLOPRIM) 300 MG tablet Take 300 mg by mouth daily. 11/11/16  Yes [provider]  atorvastatin (LIPITOR) 10 MG tablet Take 10 mg by mouth daily at 6 PM.  08/13/15  Yes [provider]  ferrous sulfate 325 (65 FE) MG tablet Take 1 tablet (325 mg total) by mouth daily with breakfast. 11/27/17  Yes Nedrud, Jeanella Flattery, MD  metoprolol succinate (TOPROL-XL) 25 MG 24 hr tablet Take 25 mg by mouth daily.   Yes [provider]  Oxycodone HCl 20 MG TABS Take 1 tablet (20 mg total) by mouth 4 (four) times daily. 07/31/18  Yes Lawyer, Cristal Deer, PA-C  pantoprazole (PROTONIX) 40 MG tablet Take 40 mg by mouth 2 (two) times daily. 08/21/18  Yes [provider]  predniSONE (DELTASONE) 5 MG tablet Take 5 mg by mouth 3 (three) times daily.   Yes [provider]  valproic acid (DEPAKENE) 250 MG capsule Take 500 mg by mouth 3 (three) times daily.   Yes [provider]  feeding supplement, ENSURE ENLIVE, (ENSURE ENLIVE) LIQD Take 237 mLs by mouth 2 (two) times daily between meals. Patient not taking: Reported on 09/24/2018 11/10/15   Catarina Hartshorn, MD  gabapentin (NEURONTIN) 300 MG capsule Take 1 capsule (300 mg total) by mouth at bedtime. Patient not taking: Reported on 09/24/2018 05/13/18 09/24/26  Zigmund Daniel., MD  levETIRAcetam (KEPPRA) 500 MG tablet Take 1 tablet (500 mg total) by mouth 2 (two) times daily. Patient not taking: Reported on 09/24/2018 05/13/18 09/24/26  Zigmund Daniel., MD  oxyCODONE (OXY IR/ROXICODONE) 5 MG immediate release tablet Take 1-2 tablets (5-10 mg total) by mouth every 6 (six) hours as needed for moderate pain or severe pain. Patient not taking: Reported on 07/31/2018 11/26/17   Rozann Lesches, MD  senna-docusate (SENOKOT-S) 8.6-50 MG tablet Take 2 tablets by mouth 2 (two) times  daily. Patient not taking: Reported on 09/24/2018 11/26/17   Rozann Lesches, MD  tiZANidine (ZANAFLEX) 4 MG tablet Take 1 tablet (4 mg total) by mouth 4 (four) times daily as needed for muscle spasms. Patient not taking: Reported on 09/24/2018 11/26/17   Rozann Lesches, MD  Vitamin D, Ergocalciferol, (DRISDOL) 50000 UNITS CAPS capsule Take 1 capsule (50,000 Units total) by mouth every 7 (seven) days. Sunday Patient not taking: Reported on 09/24/2018 01/27/15   Calvert Cantor, MD     Critical care time:

## 2018-09-26 NOTE — Progress Notes (Signed)
CRITICAL VALUE ALERT  Critical Value: Hgb 5.9  Date & Time Notied: 09/26/2018 0330   Provider Notified: Kasa  Orders Received/Actions taken: transfuse 1 unit

## 2018-09-26 NOTE — Progress Notes (Signed)
Received consent to transfuse blood from Onya Eutsler (daughter) at [phone] via telephone. Pt unable to sign due to altered mental status.

## 2018-09-26 NOTE — Progress Notes (Signed)
PT Cancellation Note  Patient Details Name: Amber [name] MRN: [id number] DOB: 1956-08-21   Cancelled Treatment:    Reason Eval/Treat Not Completed: Patient at procedure or test/unavailable ]   Etta Grandchild, PT, DPT Acute Rehabilitation Services Pager: [phone] Office: [phone]     Etta Grandchild 09/26/2018, 3:55 PM

## 2018-09-26 NOTE — Progress Notes (Signed)
RT NOTE: RT Miquel Stacks and RT Harvie Junior each attempted to collect ABG from patient twice. RT's unable to obtain sample. MD notified and aware. Vitals are stable. RT will continue to monitor.

## 2018-09-26 NOTE — Progress Notes (Signed)
CRITICAL VALUE ALERT  Critical Value:  Lactic acid 2.7  Date & Time Notied:  09/26/2018 0345  Provider Notified: Belia Heman  Orders Received/Actions taken: No orders given at this time

## 2018-09-26 NOTE — Procedures (Signed)
ELECTROENCEPHALOGRAM REPORT   Patient: Amber [name]       Room #: 2M13C EEG No. ID: [id number] Age: 62 y.o.        Sex: female Referring Physician: Primitivo Gauze Report Date:  09/26/2018        Interpreting Physician: Thana Farr  History: [name] is an 62 y.o. female with a history of seizures presenting with altered mental status  Medications:  Lipitor, Protonix, Zosyn, Depacon  Conditions of Recording:  This is a 21 channel routine scalp EEG performed with bipolar and monopolar montages arranged in accordance to the international 10/20 system of electrode placement. One channel was dedicated to EKG recording.  The patient is in the drowsy and asleep states.  Description:  The patient is drowsy and asleep for the majority of the recording.  With drowse the background is noted to be slow and irregular with low voltage theta and beta activity noted.  During light sleep there is noted symmetrical sleep spindles, vertex central sharp transients and irregular slow activity.   There is an attempt made to stimulate the patient to wakefulness but only drowse is achieved.  No full wakefulness could be evaluated. No epileptiform activity is noted.   Hyperventilation and intermittent photic stimulation were not performed.  IMPRESSION: Normal electroencephalogram, drowsy and asleep. There are no focal lateralizing or epileptiform features.   Thana Farr, MD Neurology [phone] 09/26/2018, 6:12 PM

## 2018-09-26 NOTE — Evaluation (Signed)
Physical Therapy Evaluation Patient Details Name: Amber [name] MRN: [id number] DOB: [birth year] Today's Date: 09/26/2018   History of Present Illness  62 y.o. female admitted 11/5 with hypotension and AMS now being treated for PNA and Sepsis. PMH includes: CVA, HTN, Anemia, GERD, Seizures, , ACS, Frequent Falls, Back surgery.       Clinical Impression  Pt admitted with above diagnosis. Pt currently with functional limitations due to the deficits listed below (see PT Problem List). PTA, pt living at home with grandson ambulating with RW and having aide help 7x a week for ADLs. Today patient weaker than baseline, min A to come to sitting and too fatigued to stand tonight.   Pt will benefit from skilled PT to increase their independence and safety with mobility to allow discharge to the venue listed below.       Follow Up Recommendations SNF(may progress to HHPT)    Equipment Recommendations  (TBD)    Recommendations for Other Services OT consult     Precautions / Restrictions Precautions Precautions: Fall Restrictions Weight Bearing Restrictions: No      Mobility  Bed Mobility Overal bed mobility: Needs Assistance Bed Mobility: Supine to Sit     Supine to sit: Min assist     General bed mobility comments: Min A for bed moblity today to support trunk up  Transfers                 General transfer comment: deferred due to patient request.   Ambulation/Gait                Stairs            Wheelchair Mobility    Modified Rankin (Stroke Patients Only)       Balance                                             Pertinent Vitals/Pain Pain Assessment: No/denies pain    Home Living Family/patient expects to be discharged to:: Skilled nursing facility                 Additional Comments: lives with grandson and son aroudn the corner, has aide come 7x a week, has help from son with ADL and IADls, ambulates with RW     Prior Function Level of Independence: Needs assistance   Gait / Transfers Assistance Needed: pt reports she ambulates with rollator  ADL's / Homemaking Assistance Needed: Per PT report, personal care assistant assists with ADL tasks 7 days/week, 3 hours/day        Hand Dominance        Extremity/Trunk Assessment   Upper Extremity Assessment Upper Extremity Assessment: Generalized weakness    Lower Extremity Assessment Lower Extremity Assessment: Generalized weakness       Communication      Cognition Arousal/Alertness: Awake/alert                                     General Comments: AO to place person situation but not time/year      General Comments      Exercises Low Level/ICU Exercises Ankle Circles/Pumps: 10 reps Hip ABduction/ADduction: 10 reps   Assessment/Plan    PT Assessment    PT Problem List  PT Treatment Interventions      PT Goals (Current goals can be found in the Care Plan section)  Acute Rehab PT Goals Patient Stated Goal: get stronger and walk  PT Goal Formulation: With patient/family Time For Goal Achievement: 10/10/18 Potential to Achieve Goals: Good    Frequency Min 3X/week   Barriers to discharge        Co-evaluation               AM-PAC PT "6 Clicks" Daily Activity  Outcome Measure Difficulty turning over in bed (including adjusting bedclothes, sheets and blankets)?: Unable Difficulty moving from lying on back to sitting on the side of the bed? : Unable Difficulty sitting down on and standing up from a chair with arms (e.g., wheelchair, bedside commode, etc,.)?: Unable Help needed moving to and from a bed to chair (including a wheelchair)?: Total Help needed walking in hospital room?: Total Help needed climbing 3-5 steps with a railing? : Total 6 Click Score: 6    End of Session Equipment Utilized During Treatment: Gait belt;Oxygen Activity Tolerance: Patient limited by fatigue Patient  left: in bed Nurse Communication: Mobility status PT Visit Diagnosis: Unsteadiness on feet (R26.81);Muscle weakness (generalized) (M62.81)    Time: 8563-1497 PT Time Calculation (min) (ACUTE ONLY): 25 min   Charges:   PT Evaluation $PT Eval Low Complexity: 1 Low PT Treatments $Therapeutic Activity: 8-22 mins        Etta Grandchild, PT, DPT Acute Rehabilitation Services Pager: [phone] Office: [phone]    Etta Grandchild 09/26/2018, 6:02 PM

## 2018-09-27 LAB — CBC WITH DIFFERENTIAL/PLATELET
Abs Immature Granulocytes: 0.06 10*3/uL (ref 0.00–0.07)
BASOS PCT: 0 %
Basophils Absolute: 0 10*3/uL (ref 0.0–0.1)
EOS ABS: 0 10*3/uL (ref 0.0–0.5)
EOS PCT: 0 %
HCT: 31.2 % — ABNORMAL LOW (ref 36.0–46.0)
Hemoglobin: 10.2 g/dL — ABNORMAL LOW (ref 12.0–15.0)
Immature Granulocytes: 1 %
Lymphocytes Relative: 30 %
Lymphs Abs: 2 10*3/uL (ref 0.7–4.0)
MCH: 26.1 pg (ref 26.0–34.0)
MCHC: 32.7 g/dL (ref 30.0–36.0)
MCV: 79.8 fL — ABNORMAL LOW (ref 80.0–100.0)
Monocytes Absolute: 0.4 10*3/uL (ref 0.1–1.0)
Monocytes Relative: 5 %
NEUTROS PCT: 64 %
NRBC: 0.3 % — AB (ref 0.0–0.2)
Neutro Abs: 4.3 10*3/uL (ref 1.7–7.7)
PLATELETS: 72 10*3/uL — AB (ref 150–400)
RBC: 3.91 MIL/uL (ref 3.87–5.11)
RDW: 17.8 % — AB (ref 11.5–15.5)
WBC: 6.8 10*3/uL (ref 4.0–10.5)

## 2018-09-27 LAB — TYPE AND SCREEN
ABO/RH(D): O POS
Antibody Screen: NEGATIVE
UNIT DIVISION: 0

## 2018-09-27 LAB — BPAM RBC
Blood Product Expiration Date: 201911142359
ISSUE DATE / TIME: 201911070610
Unit Type and Rh: 5100

## 2018-09-27 LAB — COMPREHENSIVE METABOLIC PANEL
ALT: 13 U/L (ref 0–44)
ANION GAP: 6 (ref 5–15)
AST: 37 U/L (ref 15–41)
Albumin: 1.3 g/dL — ABNORMAL LOW (ref 3.5–5.0)
Alkaline Phosphatase: 100 U/L (ref 38–126)
BILIRUBIN TOTAL: 0.9 mg/dL (ref 0.3–1.2)
BUN: 11 mg/dL (ref 6–20)
CHLORIDE: 120 mmol/L — AB (ref 98–111)
CO2: 22 mmol/L (ref 22–32)
Calcium: 7.7 mg/dL — ABNORMAL LOW (ref 8.9–10.3)
Creatinine, Ser: 1.02 mg/dL — ABNORMAL HIGH (ref 0.44–1.00)
GFR calc Af Amer: >60 mL/min (ref >60)
GFR calc non Af Amer: 59 mL/min — ABNORMAL LOW (ref >60)
GLUCOSE: 71 mg/dL (ref 70–99)
POTASSIUM: 2.8 mmol/L — AB (ref 3.5–5.1)
SODIUM: 148 mmol/L — AB (ref 135–145)
TOTAL PROTEIN: 5.1 g/dL — AB (ref 6.5–8.1)

## 2018-09-27 LAB — URINE CULTURE: Culture: >=100000 — AB

## 2018-09-27 LAB — LACTIC ACID, PLASMA: Lactic Acid, Venous: 1.7 mmol/L (ref 0.5–1.9)

## 2018-09-27 LAB — GLUCOSE, CAPILLARY: Glucose-Capillary: 111 mg/dL — ABNORMAL HIGH (ref 70–99)

## 2018-09-27 MED ORDER — POTASSIUM CHLORIDE 10 MEQ/100ML IV SOLN
10.0000 meq | INTRAVENOUS | Status: AC
  Administered 2018-09-27 (×6): 10 meq via INTRAVENOUS
  Filled 2018-09-27 (×6): qty 100

## 2018-09-27 MED ORDER — CEPHALEXIN 500 MG PO CAPS
500.0000 mg | ORAL_CAPSULE | Freq: Three times a day (TID) | ORAL | Status: DC
  Administered 2018-09-27 – 2018-10-01 (×11): 500 mg via ORAL
  Filled 2018-09-27 (×13): qty 1

## 2018-09-27 MED ORDER — PANTOPRAZOLE SODIUM 40 MG PO TBEC
40.0000 mg | DELAYED_RELEASE_TABLET | Freq: Every day | ORAL | Status: DC
  Administered 2018-09-27 – 2018-10-01 (×5): 40 mg via ORAL
  Filled 2018-09-27 (×5): qty 1

## 2018-09-27 MED ORDER — ACETAMINOPHEN 325 MG PO TABS
650.0000 mg | ORAL_TABLET | Freq: Four times a day (QID) | ORAL | Status: DC | PRN
  Administered 2018-09-28 – 2018-09-30 (×2): 650 mg via ORAL
  Filled 2018-09-27 (×2): qty 2

## 2018-09-27 MED ORDER — VALPROIC ACID 250 MG PO CAPS
500.0000 mg | ORAL_CAPSULE | Freq: Three times a day (TID) | ORAL | Status: DC
  Administered 2018-09-27 – 2018-10-01 (×12): 500 mg via ORAL
  Filled 2018-09-27 (×14): qty 2

## 2018-09-27 MED ORDER — PREDNISONE 5 MG PO TABS
5.0000 mg | ORAL_TABLET | Freq: Three times a day (TID) | ORAL | Status: DC
  Administered 2018-09-27 – 2018-10-01 (×13): 5 mg via ORAL
  Filled 2018-09-27 (×14): qty 1

## 2018-09-27 NOTE — Progress Notes (Signed)
Report called to 5W RN

## 2018-09-27 NOTE — Progress Notes (Signed)
Called pt's son [name] to inform him of pt's impending transfer. Unit and room number provided.

## 2018-09-27 NOTE — Evaluation (Signed)
Clinical/Bedside Swallow Evaluation Patient Details  Name: Amber [name] MRN: [id number] Date of Birth: January 15, 1956  Today's Date: 09/27/2018 Time: SLP Start Time (ACUTE ONLY): 1344 SLP Stop Time (ACUTE ONLY): 1354 SLP Time Calculation (min) (ACUTE ONLY): 10 min  Past Medical History:  Past Medical History:  Diagnosis Date  . ACS (acute coronary syndrome) (HCC) 06/12/2015  . Acute on chronic blood loss anemia 11/25/2017  . Arthritis   . Asthma   . 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 Chronic pain   . Constipation   . Diabetes mellitus without complication (HCC)   . Duodenitis   . Embolic stroke (HCC)   . Falls frequently   . Focal seizure (HCC)   . GERD (gastroesophageal reflux disease)   . Hypertension   . Impacted fracture 02/15/2017   right glenoid  . Pneumonia 10/2015  . Seizures (HCC)    last seizure March 2015  . Small bowel obstruction (HCC) 11/25/2017   Past Surgical History:  Past Surgical History:  Procedure Laterality Date  . APPENDECTOMY    . BACK SURGERY    . CHOLECYSTECTOMY    . TEE WITHOUT CARDIOVERSION N/A 10/30/2016   Procedure: TRANSESOPHAGEAL ECHOCARDIOGRAM (TEE);  Surgeon: Lars Masson, MD;  Location: [facility] ENDOSCOPY;  Service: Cardiovascular;  Laterality: N/A;   HPI:  62 y.o. female admitted 11/5 with hypotension and AMS now being treated for PNA and Sepsis. PMH includes: CVA, HTN, Anemia, GERD, Seizures, , ACS, Frequent Falls, Back surgery.  Pt had prior BSE in January 2018 recommending regular diet and thin liquids but MBS was recommended given h/o esophageal issues, hoarse vocal quality, and recurrent PNA. This was scheduled to be done on an OP basis as pt was discharging, but it appears as though she did not keep the appointment. MBS was performed in October 2019 at [facility] with report suggesting an oral dysphagia although with flash penetration with thin liquids. Dys 2 diet and thin liquids were recommended.   Assessment /  Plan / Recommendation Clinical Impression  Pt awakens easily but only consumes minimal amounts of PO intake, otherwise refusing to try more. She has no overt signs of aspiration for what she does swallow, which consisted of purees and thin liquids. Given recent MBS at outside facility that showed good airway protection, would think it is reasonable to maintain current diet. Will f/u to facilitate diet advancement as more PO trials can be observed. SLP Visit Diagnosis: Dysphagia, unspecified (R13.10)    Aspiration Risk  Mild aspiration risk    Diet Recommendation Thin liquid(continue full liquid diet)   Liquid Administration via: Straw;Cup Medication Administration: Whole meds with puree Supervision: Staff to assist with self feeding;Full supervision/cueing for compensatory strategies Compensations: Slow rate;Small sips/bites Postural Changes: Seated upright at 90 degrees    Other  Recommendations Oral Care Recommendations: Oral care BID   Follow up Recommendations (tba - none anticipated)      Frequency and Duration min 2x/week  2 weeks       Prognosis Prognosis for Safe Diet Advancement: Good      Swallow Study   General HPI: 62 y.o. female admitted 11/5 with hypotension and AMS now being treated for PNA and Sepsis. PMH includes: CVA, HTN, Anemia, GERD, Seizures, , ACS, Frequent Falls, Back surgery.  Pt had prior BSE in January 2018 recommending regular diet and thin liquids but MBS was recommended given h/o esophageal issues, hoarse vocal quality, and recurrent PNA. This was scheduled to be done on an  OP basis as pt was discharging, but it appears as though she did not keep the appointment. MBS was performed in October 2019 at [facility] with report suggesting an oral dysphagia although with flash penetration with thin liquids. Dys 2 diet and thin liquids were recommended. Type of Study: Bedside Swallow Evaluation Previous Swallow Assessment: see HPI Diet Prior to this Study: Thin  liquids;Other (Comment)(full liquid) Temperature Spikes Noted: No Respiratory Status: Room air History of Recent Intubation: No Behavior/Cognition: Alert;Requires cueing Oral Cavity Assessment: Other (comment)(needs more assessment - won't open mouth) Oral Care Completed by SLP: No Oral Cavity - Dentition: (difficult to see - needs further assessment) Patient Positioning: Upright in bed Baseline Vocal Quality: Low vocal intensity    Oral/Motor/Sensory Function Overall Oral Motor/Sensory Function: (not following commands to assess)   Ice Chips Ice chips: Not tested   Thin Liquid Thin Liquid: Impaired Presentation: Spoon;Straw Oral Phase Impairments: Poor awareness of bolus    Nectar Thick Nectar Thick Liquid: Not tested   Honey Thick Honey Thick Liquid: Not tested   Puree Puree: Impaired Presentation: Spoon Oral Phase Impairments: Poor awareness of bolus   Solid     Solid: Not tested      Maxcine Ham 09/27/2018,2:05 PM  Maxcine Ham, M.A. CCC-SLP Acute Herbalist [phone] Office [phone]

## 2018-09-27 NOTE — Progress Notes (Signed)
Pt admitted to the the unit to room 37, pt  oriented to the room; pt in the bed at this time call bell place within reach and skin assessment completed, pt is slow to respond family at bed side.

## 2018-09-27 NOTE — Progress Notes (Signed)
NAME:  Amber [name], MRN:  [id number], DOB:  January 16, 1956, LOS: 3 ADMISSION DATE:  09/24/2018, CONSULTATION DATE:  09/24/2018 REFERRING MD:  Madilyn Hook, CHIEF COMPLAINT:  AMS, Hypotension  Brief History   Azizi Bally is a 62 y.o. female who was admitted 11/5 with hypotension and AMS.  Had recent admission to [facility] for CAP and anemia due to GI bleed (was on eliquis for PAF which was held during this admission and d/c'd after discussing the risk of rebleed).  Past Medical History  Seizures, CVA, HTN, Anemia, GERD, GI bleed, arthritis. Significant Hospital Events   11/5 > admit. 11/7 1uprbc, ct head, EEG  Consults: date of consult/date signed off & final recs:  none  Procedures (surgical and bedside):  none  Significant Diagnostic Tests:  CXR 11/5 > mild b/l infrahilar opacities. Echo 11/6 > EF 65-70%, G1DD CT head: no stroke EEG: normal  Micro Data:  Blood 11/5 > NG at 48 hours Urine 11/5 > 100k CFU ecoli, sensitivities pending RVP 11/5 > negative  Antimicrobials:  Vancomycin 11/5-11/7 Zosyn 11/5-  Subjective:  Awake and alert this am. Easily arousable. AO x2.  Objective   Blood pressure 133/85, pulse 75, temperature (!) 97.5 F (36.4 C), temperature source Oral, resp. rate 17, height 5' (1.524 m), weight 45.4 kg, SpO2 100 %.        Intake/Output Summary (Last 24 hours) at 09/27/2018 0815 Last data filed at 09/27/2018 0600 Gross per 24 hour  Intake 2273.4 ml  Output 800 ml  Net 1473.4 ml   Filed Weights   09/24/18 1951 09/25/18 0452 09/27/18 0500  Weight: 59 kg 42.7 kg 45.4 kg    Examination: General: 62 year old AA female. Easy to arouse, AOx2. No distress. Covered with bear hugger and blankets HEENT: MMM, [place]/AT, PERRL Lungs: lungs clear to ausculation bilaterally, no accessory muscle use Cardiovascular: rrr, no m/r/g. 2+ pitting edema BUE. Trace edema bilateral lower extremity Abdomen: soft, non-tender, non-distended Extremities: able to move all  extremities Neuro: cn 2-12 intact, no neuro deficit GU: external urinary catheter in place Neuro: rhythmic smacking of lips, easy to arouse, ao x2, no focal neuro deficits  Resolved Hospital Problem list   Altered mental status Lactic Acidosis Hypotension Hypocalcemia Anemia  Assessment & Plan:  62 year old who presents with AMS and hypotension. Recently admitted to [place] for CAP. Improving lactic acid. Easily arousable now. Lip smacking consistent with tardive dyskinesia given additional history from son. AMS for unknown etiology. Can likely advance to liquids. Transition meds to po. Hypokalemia to 2.7 this am.  UTI - Continue Zosyn (11/5- ), switch to PO when sensitivities result - monitor blood cx - Can transfer out of ICU  Seizure disorder - continue depakote 500mg  tid  Failure to thrive - per notes from [facility], had 5 admissions this year so far.  Discussed goals of care with family who have opted to continue with full code status. - PT efforts.  Hx HTN, HLD, PAF (previously on eliquis, d/c'd after last admission to [facility] due to GI bleed). - Continue preadmission atorvastatin. - Hold preadmission toprol-xl.   Disposition / Summary of Today's Plan 09/27/18   Narrow from zosyn when sensitivities are available Advance diet Change IV meds to PO     Diet: NPO Pain/Anxiety/Delirium protocol (if indicated): home oxycodone VAP protocol (if indicated): in place DVT prophylaxis: hold heparin, SCDs GI prophylaxis: protonix Hyperglycemia protocol: N/A Mobility: bedbound Code Status: full Family Communication: none at  bedside  Labs   CBC: Recent Labs  Lab 09/24/18 1924 09/24/18 1939 09/25/18 0339 09/26/18 0304 09/26/18 1424  WBC 7.6  --  7.3 6.3  --   NEUTROABS 3.6  --   --  4.8  --   HGB 8.8* 9.9* 8.0* 5.9* 9.5*  HCT 27.4* 29.0* 25.5* 17.8* 29.0*  MCV 81.5  --  83.3 80.5  --   PLT PLATELET CLUMPS NOTED ON SMEAR, COUNT APPEARS DECREASED   --  81* 72*  --     Basic Metabolic Panel: Recent Labs  Lab 09/24/18 1939 09/25/18 0339 09/26/18 0304 09/27/18 0623  NA 142 143 145 148*  K 5.5* 4.0 3.7 2.8*  CL 115* 121* 124* 120*  CO2  --  15* 17* 22  GLUCOSE 112* 142* 138* 71  BUN 14 10 10 11   CREATININE 0.90 0.91 1.05* 1.02*  CALCIUM  --  7.5* 7.1* 7.7*  MG  --  1.4* 1.8  --   PHOS  --  3.9  --   --    GFR: Estimated Creatinine Clearance: 42 mL/min (A) (by C-G formula based on SCr of 1.02 mg/dL (H)). Recent Labs  Lab 09/24/18 1924  09/24/18 2331 09/25/18 0339 09/25/18 1047 09/25/18 1411 09/26/18 0304 09/27/18 0623  PROCALCITON  --   --  0.18 0.60  --   --  1.38  --   WBC 7.6  --   --  7.3  --   --  6.3  --   LATICACIDVEN  --    < >  --  5.8* 4.7* 3.9* 2.7* 1.7   < > = values in this interval not displayed.    Liver Function Tests: Recent Labs  Lab 09/26/18 0304 09/27/18 0623  AST 25 37  ALT 11 13  ALKPHOS 97 100  BILITOT 0.4 0.9  PROT 4.2* 5.1*  ALBUMIN 1.1* 1.3*   No results for input(s): LIPASE, AMYLASE in the last 168 hours. No results for input(s): AMMONIA in the last 168 hours.  ABG    Component Value Date/Time   PHART 7.290 (L) 09/24/2018 1959   PCO2ART 42.0 09/24/2018 1959   PO2ART 87.0 09/24/2018 1959   HCO3 20.2 09/24/2018 1959   TCO2 21 (L) 09/24/2018 1959   ACIDBASEDEF 6.0 (H) 09/24/2018 1959   O2SAT 95.0 09/24/2018 1959     Coagulation Profile: No results for input(s): INR, PROTIME in the last 168 hours.  Cardiac Enzymes: No results for input(s): CKTOTAL, CKMB, CKMBINDEX, TROPONINI in the last 168 hours.  HbA1C: Hgb A1c MFr Bld  Date/Time Value Ref Range Status  05/08/2018 11:14 AM 5.7 (H) 4.8 - 5.6 % Final    Comment:    (NOTE) Pre diabetes:          5.7%-6.4% Diabetes:              >6.4% Glycemic control for   <7.0% adults with diabetes   10/27/2016 05:32 AM 4.7 (L) 4.8 - 5.6 % Final    Comment:    (NOTE)         Pre-diabetes: 5.7 - 6.4         Diabetes: >6.4          Glycemic control for adults with diabetes: <7.0     CBG: Recent Labs  Lab 09/24/18 1908 09/25/18 2141  GLUCAP 92 149*    Admitting History of Present Illness.   Tyla Burgner is a 62 y.o. female who has a PMH as outlined below.  She presented to [facility] ED 11/5 with AMS and generalized weakness.  Per family, she was recently hospitalized at [facility] from 10/17 through 10/31 for CAP and anemia due to GI bleed (was on eliquis for PAF which was held during this admission and d/c'd after discussing the risk of rebleed).  She was discharged home and had been doing well up until morning of 11/5 when she had generalized weakness followed by AMS.  Family subsequently brought her to ED for further evaluation.  In ED, she was found to have hypotension.  She was given 30cc/kg with minimal improvement in BP.  PCCM was subsequently called for admission.  Review of Systems:   Unable to obtain on admission as pt was encephalopathic.  Past Medical History  She,  has a past medical history of ACS (acute coronary syndrome) (HCC) (06/12/2015), Acute on chronic blood loss anemia (11/25/2017), Arthritis, Asthma, Cerebrovascular accident (CVA) due to embolism (HCC) (10/26/2016), Chronic pain, Constipation, Diabetes mellitus without complication (HCC), Duodenitis, Embolic stroke (HCC), Falls frequently, Focal seizure (HCC), GERD (gastroesophageal reflux disease), Hypertension, Impacted fracture (02/15/2017), Pneumonia (10/2015), Seizures (HCC), and Small bowel obstruction (HCC) (11/25/2017).   Surgical History    Past Surgical History:  Procedure Laterality Date  . APPENDECTOMY    . BACK SURGERY    . CHOLECYSTECTOMY    . TEE WITHOUT CARDIOVERSION N/A 10/30/2016   Procedure: TRANSESOPHAGEAL ECHOCARDIOGRAM (TEE);  Surgeon: Lars Masson, MD;  Location: [facility] ENDOSCOPY;  Service: Cardiovascular;  Laterality: N/A;     Social History   Social History   Socioeconomic History  . Marital status:  Single    Spouse name: Not on file  . Number of children: Not on file  . Years of education: Not on file  . Highest education level: Not on file  Occupational History  . Not on file  Social Needs  . Financial resource strain: Not on file  . Food insecurity:    Worry: Not on file    Inability: Not on file  . Transportation needs:    Medical: Not on file    Non-medical: Not on file  Tobacco Use  . Smoking status: Former Smoker    Last attempt to quit: 2010    Years since quitting: 9.8  . Smokeless tobacco: Never Used  Substance and Sexual Activity  . Alcohol use: No    Comment: admits to 2 drinks/week  . Drug use: No  . Sexual activity: Never  Lifestyle  . Physical activity:    Days per week: Not on file    Minutes per session: Not on file  . Stress: Not on file  Relationships  . Social connections:    Talks on phone: Not on file    Gets together: Not on file    Attends religious service: Not on file    Active member of club or organization: Not on file    Attends meetings of clubs or organizations: Not on file    Relationship status: Not on file  . Intimate partner violence:    Fear of current or ex partner: Not on file    Emotionally abused: Not on file    Physically abused: Not on file    Forced sexual activity: Not on file  Other Topics Concern  . Not on file  Social History Narrative  . Not on file  ,  reports that she quit smoking about 9 years ago. She has never used smokeless tobacco. She reports that she does not  drink alcohol or use drugs.   Family History   Her family history includes Hypertension in her brother and sister; Kidney failure in her mother.   Allergies Allergies  Allergen Reactions  . Ivp Dye [Iodinated Diagnostic Agents] Itching  . Metrizamide Itching     Home Medications  Prior to Admission medications   Medication Sig Start Date End Date Taking? Authorizing Provider  allopurinol (ZYLOPRIM) 300 MG tablet Take 300 mg by mouth daily.  11/11/16  Yes [provider]  atorvastatin (LIPITOR) 10 MG tablet Take 10 mg by mouth daily at 6 PM.  08/13/15  Yes [provider]  ferrous sulfate 325 (65 FE) MG tablet Take 1 tablet (325 mg total) by mouth daily with breakfast. 11/27/17  Yes Nedrud, Jeanella Flattery, MD  metoprolol succinate (TOPROL-XL) 25 MG 24 hr tablet Take 25 mg by mouth daily.   Yes [provider]  Oxycodone HCl 20 MG TABS Take 1 tablet (20 mg total) by mouth 4 (four) times daily. 07/31/18  Yes Lawyer, Cristal Deer, PA-C  pantoprazole (PROTONIX) 40 MG tablet Take 40 mg by mouth 2 (two) times daily. 08/21/18  Yes [provider]  predniSONE (DELTASONE) 5 MG tablet Take 5 mg by mouth 3 (three) times daily.   Yes [provider]  valproic acid (DEPAKENE) 250 MG capsule Take 500 mg by mouth 3 (three) times daily.   Yes [provider]  feeding supplement, ENSURE ENLIVE, (ENSURE ENLIVE) LIQD Take 237 mLs by mouth 2 (two) times daily between meals. Patient not taking: Reported on 09/24/2018 11/10/15   Catarina Hartshorn, MD  gabapentin (NEURONTIN) 300 MG capsule Take 1 capsule (300 mg total) by mouth at bedtime. Patient not taking: Reported on 09/24/2018 05/13/18 09/24/26  Zigmund Daniel., MD  levETIRAcetam (KEPPRA) 500 MG tablet Take 1 tablet (500 mg total) by mouth 2 (two) times daily. Patient not taking: Reported on 09/24/2018 05/13/18 09/24/26  Zigmund Daniel., MD  oxyCODONE (OXY IR/ROXICODONE) 5 MG immediate release tablet Take 1-2 tablets (5-10 mg total) by mouth every 6 (six) hours as needed for moderate pain or severe pain. Patient not taking: Reported on 07/31/2018 11/26/17   Rozann Lesches, MD  senna-docusate (SENOKOT-S) 8.6-50 MG tablet Take 2 tablets by mouth 2 (two) times daily. Patient not taking: Reported on 09/24/2018 11/26/17   Rozann Lesches, MD  tiZANidine (ZANAFLEX) 4 MG tablet Take 1 tablet (4 mg total) by mouth 4 (four) times daily as needed for muscle spasms. Patient  not taking: Reported on 09/24/2018 11/26/17   Rozann Lesches, MD  Vitamin D, Ergocalciferol, (DRISDOL) 50000 UNITS CAPS capsule Take 1 capsule (50,000 Units total) by mouth every 7 (seven) days. Sunday Patient not taking: Reported on 09/24/2018 01/27/15   Calvert Cantor, MD     Critical care time:

## 2018-09-27 NOTE — Progress Notes (Signed)
Pt has an open case with [facility] APS. Pt's assigned social worker is [employer]. Crystal White provided pt's RN with release paperwork. Crystal White requested copies of pt's HMP. CSW explained that the process is to go through Medical Records. Per [employer], Medical Records do not have information from pt's current visit. CSW explained she would have to discuss this with her supervisor to see what the process would be.   CSW will follow up with [facility] APS.   Montine Circle, [facility]  [phone]

## 2018-09-28 DIAGNOSIS — J449 Chronic obstructive pulmonary disease, unspecified: Secondary | ICD-10-CM

## 2018-09-28 DIAGNOSIS — D649 Anemia, unspecified: Secondary | ICD-10-CM

## 2018-09-28 DIAGNOSIS — N390 Urinary tract infection, site not specified: Secondary | ICD-10-CM

## 2018-09-28 LAB — COMPREHENSIVE METABOLIC PANEL
ALBUMIN: 1.4 g/dL — AB (ref 3.5–5.0)
ALK PHOS: 113 U/L (ref 38–126)
ALT: 18 U/L (ref 0–44)
ANION GAP: 7 (ref 5–15)
AST: 42 U/L — ABNORMAL HIGH (ref 15–41)
BILIRUBIN TOTAL: 0.7 mg/dL (ref 0.3–1.2)
BUN: 8 mg/dL (ref 6–20)
CALCIUM: 7.5 mg/dL — AB (ref 8.9–10.3)
CO2: 19 mmol/L — ABNORMAL LOW (ref 22–32)
CREATININE: 0.94 mg/dL (ref 0.44–1.00)
Chloride: 116 mmol/L — ABNORMAL HIGH (ref 98–111)
GFR calc Af Amer: >60 mL/min (ref >60)
GFR calc non Af Amer: >60 mL/min (ref >60)
GLUCOSE: 84 mg/dL (ref 70–99)
Potassium: 3.2 mmol/L — ABNORMAL LOW (ref 3.5–5.1)
Sodium: 142 mmol/L (ref 135–145)
TOTAL PROTEIN: 5.5 g/dL — AB (ref 6.5–8.1)

## 2018-09-28 LAB — CBC WITH DIFFERENTIAL/PLATELET
Abs Immature Granulocytes: 0.03 10*3/uL (ref 0.00–0.07)
BASOS ABS: 0 10*3/uL (ref 0.0–0.1)
Basophils Relative: 0 %
EOS PCT: 0 %
Eosinophils Absolute: 0 10*3/uL (ref 0.0–0.5)
HEMATOCRIT: 34.9 % — AB (ref 36.0–46.0)
HEMOGLOBIN: 11.8 g/dL — AB (ref 12.0–15.0)
Immature Granulocytes: 1 %
LYMPHS ABS: 1.8 10*3/uL (ref 0.7–4.0)
LYMPHS PCT: 33 %
MCH: 26.9 pg (ref 26.0–34.0)
MCHC: 33.8 g/dL (ref 30.0–36.0)
MCV: 79.5 fL — ABNORMAL LOW (ref 80.0–100.0)
MONOS PCT: 3 %
Monocytes Absolute: 0.2 10*3/uL (ref 0.1–1.0)
NRBC: 0 % (ref 0.0–0.2)
Neutro Abs: 3.5 10*3/uL (ref 1.7–7.7)
Neutrophils Relative %: 63 %
Platelets: 70 10*3/uL — ABNORMAL LOW (ref 150–400)
RBC: 4.39 MIL/uL (ref 3.87–5.11)
RDW: 18.3 % — ABNORMAL HIGH (ref 11.5–15.5)
WBC: 5.5 10*3/uL (ref 4.0–10.5)

## 2018-09-28 LAB — MAGNESIUM: Magnesium: 1.4 mg/dL — ABNORMAL LOW (ref 1.7–2.4)

## 2018-09-28 MED ORDER — ALBUTEROL SULFATE (2.5 MG/3ML) 0.083% IN NEBU: 2.5000 mg | INHALATION_SOLUTION | RESPIRATORY_TRACT | Status: DC | PRN

## 2018-09-28 MED ORDER — MAGNESIUM SULFATE 2 GM/50ML IV SOLN
2.0000 g | Freq: Once | INTRAVENOUS | Status: AC
  Administered 2018-09-28: 2 g via INTRAVENOUS
  Filled 2018-09-28: qty 50

## 2018-09-28 MED ORDER — TIOTROPIUM BROMIDE MONOHYDRATE 18 MCG IN CAPS
18.0000 ug | ORAL_CAPSULE | Freq: Every day | RESPIRATORY_TRACT | Status: DC
  Filled 2018-09-28: qty 5

## 2018-09-28 MED ORDER — UMECLIDINIUM BROMIDE 62.5 MCG/INH IN AEPB
1.0000 | INHALATION_SPRAY | Freq: Every day | RESPIRATORY_TRACT | Status: DC
  Administered 2018-09-29 – 2018-10-01 (×3): 1 via RESPIRATORY_TRACT
  Filled 2018-09-28: qty 7

## 2018-09-28 MED ORDER — POTASSIUM CHLORIDE CRYS ER 20 MEQ PO TBCR
40.0000 meq | EXTENDED_RELEASE_TABLET | Freq: Once | ORAL | Status: AC
  Administered 2018-09-28: 40 meq via ORAL
  Filled 2018-09-28: qty 2

## 2018-09-28 NOTE — Progress Notes (Signed)
OT Cancellation Note  Patient Details Name: Amber [name] MRN: [id number] DOB: [birth year]   Cancelled Treatment:    Reason Eval/Treat Not Completed: Other (comment)(Bathing with NT. Will return as schedule allows. )  Icker Swigert M Idabell Picking Raelie Lohr MSOT, OTR/L Acute Rehab Pager: [phone] Office: [phone] 09/28/2018, 2:43 PM

## 2018-09-28 NOTE — Progress Notes (Signed)
PROGRESS NOTE        PATIENT DETAILS Name: Amber [name] Age: 62 y.o. Sex: female Date of Birth: 04-15-1956 Admit Date: 09/24/2018 Admitting Physician Ileana Roup, MD PCP:Sun, Charyl Dancer, MD  Brief Narrative: Patient is a 62 y.o. female with history of PAF (not on anticoagulation due to recent GI bleeding in October 2019), seizures, hypertension, COPD, failure to thrive syndrome admitted by CCM on 11/5 with severe sepsis secondary to E. coli UTI and associated metabolic encephalopathy.  Upon stability transfer to the trial hospitalist service on 11/9.  Subjective: Sleeping when I walked in this morning-but awakes-follows commands.  Appears very frail and elderly.  Assessment/Plan: Severe sepsis secondary to E. coli UTI: Sepsis pathophysiology has resolved-no longer on IV antibiotics-has been transition to Keflex.  Blood cultures on 11/5- so far, urine culture on 11/6+ for E. coli.  Given severity of symptoms on admission-with at least plan on 7 to 10 days of total antimicrobial therapy.   Acute metabolic encephalopathy: Secondary to above-improving-although sleepy this morning-she was arousable and was able to follow commands.  CT head on admission was negative for acute abnormalities.  EEG without any seizures.  Await arrival of family to see if she is back to her baseline.  Seizure disorder: Seizure-free since admission-EEG negative for seizures.  Continue Depakote.  PAF: Sinus rhythm with some runs of A. fib overnight.  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  Anemia: Suspect secondary to critical illness-significant drop of hemoglobin on 11/5  to 5.9 requiring PRBC transfusion.  Hemoglobin currently stable at 11.8.  No evidence of GI bleeding/overt blood loss.  Follow.  Thrombocytopenia: Suspect this is secondary to sepsis-no evidence of bleeding-appears  mild at present-follow.  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  Continue PPI.  No longer on anticoagulation since discharge from [facility] hospital.  Hypokalemia/hypomagnesemia: Replete and recheck  ?  Tardive dyskinesia: Does have some lipsmacking/facial movement at times-appears to be a chronic issue-EEG negative.   History of COPD: Lungs are clear-continue with bronchodilators.  She appears to be on chronic steroids which have been continued.  History of CVA: No longer on apixaban-given recent GI bleeding.  Has generalized weakness but appears to be nonfocal.  Failure to thrive syndrome: Appears very frail and elderly-apparently has had recurrent hospitalization this year-no family at bedside-continue PT/OT/SLP eval.  Will consult nutrition/dietitian.  Probably will require SNF on discharge.  DVT Prophylaxis: SCD's-due to thrombocytopenia  Code Status: Full code  Family Communication: None at bedside  Disposition Plan: Remain inpatient-SNF on discharge-probably early next week.  Antimicrobial agents: Anti-infectives (From admission, onward)   Start     Dose/Rate Route Frequency Ordered Stop   09/27/18 1400  cephALEXin (KEFLEX) capsule 500 mg     500 mg Oral Every 8 hours 09/27/18 0952     09/25/18 0900  vancomycin (VANCOCIN) 500 mg in sodium chloride 0.9 % 100 mL IVPB  Status:  Discontinued     500 mg 100 mL/hr over 60 Minutes Intravenous Every 12 hours 09/24/18 2105 09/26/18 1135   09/25/18 0200  piperacillin-tazobactam (ZOSYN) IVPB 3.375 g  Status:  Discontinued     3.375 g 12.5 mL/hr over 240 Minutes Intravenous Every 8 hours 09/25/18 0044 09/27/18 0952   09/24/18 2000  piperacillin-tazobactam (ZOSYN) IVPB 2.25 g  Status:  Discontinued      2.25 g 100 mL/hr over 30 Minutes Intravenous  Once 09/24/18 1950 09/24/18 1953   09/24/18 2000  piperacillin-tazobactam (ZOSYN) IVPB 3.375 g     3.375 g 100 mL/hr over 30 Minutes Intravenous  Once 09/24/18 1954 09/24/18 2116   09/24/18 2000  vancomycin (VANCOCIN) IVPB 1000 mg/200 mL premix     1,000 mg 200 mL/hr over 60 Minutes Intravenous  Once 09/24/18 1954 09/24/18 2138      Procedures: 11/6>> echo:EF 65-70%  CONSULTS: PCCM  Time spent: 25- minutes-Greater than 50% of this time was spent in counseling, explanation of diagnosis, planning of further management, and coordination of care.  MEDICATIONS: Scheduled Meds: . sodium chloride   Intravenous Once  . atorvastatin  10 mg Oral q1800  . cephALEXin  500 mg Oral Q8H  . chlorhexidine  15 mL Mouth Rinse BID  . mouth rinse  15 mL Mouth Rinse q12n4p  . pantoprazole  40 mg Oral Daily  . predniSONE  5 mg Oral TID  . valproic acid  500 mg Oral TID   Continuous Infusions: . lactated ringers 1,000 mL (09/28/18 0804)   PRN Meds:.acetaminophen   PHYSICAL EXAM: Vital signs: Vitals:   09/27/18 1600 09/27/18 1750 09/27/18 2152 09/28/18 0524  BP: 117/87 (!) 127/95 (!) 147/98 (!) 159/95  Pulse: (!) 109 90 85 63  Resp: (!) 23 20 18 18   Temp:  98.8 F (37.1 C) 98 F (36.7 C) 97.8 F (36.6 C)  TempSrc:  Tympanic Oral Oral  SpO2: 100% 100% 100% 100%  Weight:      Height:       Filed Weights   09/24/18 1951 09/25/18 0452 09/27/18 0500  Weight: 59 kg 42.7 kg 45.4 kg   Body mass index is 19.55 kg/m.   General appearance :Awake, alert, not in any distress.  Appears frail and weak. HEENT: Atraumatic and Normocephalic Neck: supple, no JVD. No cervical lymphadenopathy. Resp:Good air entry bilaterally, no added sounds  CVS: S1 S2 regular,3/6 soft systolic murmur. GI: Bowel sounds present, Non tender and not distended with no gaurding, rigidity or rebound. Extremities: B/L Lower Ext shows no edema, both legs are warm to  touch Neurology: Nonfocal-but generalized weakness. Musculoskeletal:No digital cyanosis Skin:No Rash, warm and dry Wounds:N/A  I have personally reviewed following labs and imaging studies  LABORATORY DATA: CBC: Recent Labs  Lab 09/24/18 1924  09/25/18 0339 09/26/18 0304 09/26/18 1424 09/27/18 0623 09/28/18 0439  WBC 7.6  --  7.3 6.3  --  6.8 5.5  NEUTROABS 3.6  --   --  4.8  --  4.3 3.5  HGB 8.8*   < > 8.0* 5.9* 9.5* 10.2* 11.8*  HCT 27.4*   < > 25.5* 17.8* 29.0* 31.2* 34.9*  MCV 81.5  --  83.3 80.5  --  79.8* 79.5*  PLT PLATELET CLUMPS NOTED ON SMEAR, COUNT APPEARS DECREASED  --  81* 72*  --  72* 70*   < > = values in this interval not displayed.    Basic Metabolic Panel: Recent Labs  Lab 09/24/18 1939 09/25/18 0339 09/26/18 0304 09/27/18 0623 09/28/18 0439  NA 142 143 145 148* 142  K 5.5* 4.0 3.7 2.8* 3.2*  CL 115* 121* 124* 120* 116*  CO2  --  15*  17* 22 19*  GLUCOSE 112* 142* 138* 71 84  BUN 14 10 10 11 8   CREATININE 0.90 0.91 1.05* 1.02* 0.94  CALCIUM  --  7.5* 7.1* 7.7* 7.5*  MG  --  1.4* 1.8  --  1.4*  PHOS  --  3.9  --   --   --     GFR: Estimated Creatinine Clearance: 45.6 mL/min (by C-G formula based on SCr of 0.94 mg/dL).  Liver Function Tests: Recent Labs  Lab 09/26/18 0304 09/27/18 0623 09/28/18 0439  AST 25 37 42*  ALT 11 13 18   ALKPHOS 97 100 113  BILITOT 0.4 0.9 0.7  PROT 4.2* 5.1* 5.5*  ALBUMIN 1.1* 1.3* 1.4*   No results for input(s): LIPASE, AMYLASE in the last 168 hours. No results for input(s): AMMONIA in the last 168 hours.  Coagulation Profile: No results for input(s): INR, PROTIME in the last 168 hours.  Cardiac Enzymes: No results for input(s): CKTOTAL, CKMB, CKMBINDEX, TROPONINI in the last 168 hours.  BNP (last 3 results) No results for input(s): PROBNP in the last 8760 hours.  HbA1C: No results for input(s): HGBA1C in the last 72 hours.  CBG: Recent Labs  Lab 09/24/18 1908 09/25/18 2141 09/27/18 2218    GLUCAP 92 149* 111*    Lipid Profile: No results for input(s): CHOL, HDL, LDLCALC, TRIG, CHOLHDL, LDLDIRECT in the last 72 hours.  Thyroid Function Tests: No results for input(s): TSH, T4TOTAL, FREET4, T3FREE, THYROIDAB in the last 72 hours.  Anemia Panel: No results for input(s): VITAMINB12, FOLATE, FERRITIN, TIBC, IRON, RETICCTPCT in the last 72 hours.  Urine analysis:    Component Value Date/Time   COLORURINE YELLOW 09/24/2018 2340   APPEARANCEUR CLEAR 09/24/2018 2340   LABSPEC 1.009 09/24/2018 2340   PHURINE 6.0 09/24/2018 2340   GLUCOSEU NEGATIVE 09/24/2018 2340   HGBUR NEGATIVE 09/24/2018 2340   BILIRUBINUR NEGATIVE 09/24/2018 2340   KETONESUR NEGATIVE 09/24/2018 2340   PROTEINUR NEGATIVE 09/24/2018 2340   UROBILINOGEN 1.0 07/27/2015 1052   NITRITE NEGATIVE 09/24/2018 2340   LEUKOCYTESUR TRACE (A) 09/24/2018 2340    Sepsis Labs: Lactic Acid, Venous    Component Value Date/Time   LATICACIDVEN 1.7 09/27/2018 13/03/2018    MICROBIOLOGY: Recent Results (from the past 240 hour(s))  Blood culture (routine x 2)     Status: None (Preliminary result)   Collection Time: 09/24/18  7:40 PM  Result Value Ref Range Status   Specimen Description BLOOD BLOOD RIGHT FOREARM  Final   Special Requests   Final    BOTTLES DRAWN AEROBIC AND ANAEROBIC Blood Culture results may not be optimal due to an inadequate volume of blood received in culture bottles   Culture   Final    NO GROWTH 3 DAYS Performed at [facility], [address] [place], [address] [place]    Report Status PENDING  Incomplete  Blood culture (routine x 2)     Status: None (Preliminary result)   Collection Time: 09/24/18  7:45 PM  Result Value Ref Range Status   Specimen Description BLOOD LEFT ANTECUBITAL  Final   Special Requests   Final    BOTTLES DRAWN AEROBIC AND ANAEROBIC Blood Culture results may not be optimal due to an inadequate volume of blood received in culture bottles   Culture   Final    NO  GROWTH 3 DAYS Performed at [facility], [address] [place], [address] [place]    Report Status PENDING  Incomplete  Urine culture     Status: Abnormal   Collection Time: 09/25/18 12:11 AM  Result Value Ref Range Status   Specimen Description URINE, RANDOM  Final   Special Requests   Final    NONE Performed at [facility], [address] [place], [place] [address]    Culture >=100,000 COLONIES/mL ESCHERICHIA COLI (A)  Final   Report Status 09/27/2018 FINAL  Final   Organism ID, Bacteria ESCHERICHIA COLI (A)  Final      Susceptibility   Escherichia coli - MIC*    AMPICILLIN >=32 RESISTANT Resistant     CEFAZOLIN <=4 SENSITIVE Sensitive     CEFTRIAXONE <=1 SENSITIVE Sensitive     CIPROFLOXACIN >=4 RESISTANT Resistant     GENTAMICIN <=1 SENSITIVE Sensitive     IMIPENEM <=0.25 SENSITIVE Sensitive     NITROFURANTOIN <=16 SENSITIVE Sensitive     TRIMETH/SULFA <=20 SENSITIVE Sensitive     AMPICILLIN/SULBACTAM >=32 RESISTANT Resistant     PIP/TAZO <=4 SENSITIVE Sensitive     Extended ESBL NEGATIVE Sensitive     * >=100,000 COLONIES/mL ESCHERICHIA COLI  Respiratory Panel by PCR     Status: None   Collection Time: 09/25/18 12:46 AM  Result Value Ref Range Status   Adenovirus NOT DETECTED NOT DETECTED Final   Coronavirus 229E NOT DETECTED NOT DETECTED Final   Coronavirus HKU1 NOT DETECTED NOT DETECTED Final   Coronavirus NL63 NOT DETECTED NOT DETECTED Final   Coronavirus OC43 NOT DETECTED NOT DETECTED Final   Metapneumovirus NOT DETECTED NOT DETECTED Final   Rhinovirus / Enterovirus NOT DETECTED NOT DETECTED Final   Influenza A NOT DETECTED NOT DETECTED Final   Influenza B NOT DETECTED NOT DETECTED Final   Parainfluenza Virus 1 NOT DETECTED NOT DETECTED Final   Parainfluenza Virus 2 NOT DETECTED NOT DETECTED Final   Parainfluenza Virus 3 NOT DETECTED NOT DETECTED Final   Parainfluenza Virus 4 NOT DETECTED NOT DETECTED Final   Respiratory Syncytial Virus NOT  DETECTED NOT DETECTED Final   Bordetella pertussis NOT DETECTED NOT DETECTED Final   Chlamydophila pneumoniae NOT DETECTED NOT DETECTED Final   Mycoplasma pneumoniae NOT DETECTED NOT DETECTED Final    Comment: Performed at [facility], [address], [place], [place] [address]  MRSA PCR Screening     Status: None   Collection Time: 09/25/18 12:46 AM  Result Value Ref Range Status   MRSA by PCR NEGATIVE NEGATIVE Final    Comment:        The GeneXpert MRSA Assay (FDA approved for NASAL specimens only), is one component of a comprehensive MRSA colonization surveillance program. It is not intended to diagnose MRSA infection nor to guide or monitor treatment for MRSA infections. Performed at [facility], [address], [place], [place] [address]     RADIOLOGY STUDIES/RESULTS: Ct Head Wo Contrast  Result Date: 09/26/2018 CLINICAL DATA:  Unexplained altered level of consciousness EXAM: CT HEAD WITHOUT CONTRAST TECHNIQUE: Contiguous axial images were obtained from the base of the skull through the vertex without intravenous contrast. COMPARISON:  07/15/2018 FINDINGS: Brain: No evidence of acute infarction, hemorrhage, hydrocephalus, extra-axial collection or mass lesion/mass effect. Moderate remote infarct in the posterior right frontal lobe. Chronic small vessel ischemia in the cerebral white matter with remote lacunar infarcts in the right caudate and bilateral thalamus. Small remote bilateral cerebellar infarcts. Age advanced cerebral volume loss. Vascular: Atherosclerotic calcification.  No hyperdense vessel. Skull: Negative Sinuses/Orbits: Negative IMPRESSION: No acute finding. Chronic ischemic  injury as described. Electronically Signed   By: Marnee Spring M.D.   On: 09/26/2018 15:27   Dg Chest Port 1 View  Result Date: 09/25/2018 CLINICAL DATA:  Respiratory failure. EXAM: PORTABLE CHEST 1 VIEW COMPARISON:  Radiograph of September 24, 2018. FINDINGS: Stable  cardiomediastinal silhouette. No pneumothorax is noted. Old right rib fractures are noted. Mild bibasilar subsegmental atelectasis or infiltrates is noted. Status post surgical posterior fusion of thoracic spine. IMPRESSION: Mild bibasilar subsegmental atelectasis or infiltrates. Electronically Signed   By: Lupita Raider, M.D.   On: 09/25/2018 07:47   Dg Chest Portable 1 View  Result Date: 09/24/2018 CLINICAL DATA:  Lethargy. Recent hospitalization for pneumonia and sepsis. EXAM: PORTABLE CHEST 1 VIEW COMPARISON:  Chest CT 09/09/2018 and radiograph 09/06/2018 FINDINGS: The cardiac silhouette is upper limits of normal in size. There is mild chronic interstitial coarsening. The lungs are mildly hypoinflated with mild bilateral infrahilar opacity. No sizable pleural effusion or pneumothorax is identified. A well-defined lucent lesion in the proximal right humerus is chronic. There are degenerative changes at both shoulders. Right upper quadrant abdominal surgical clips and prior thoracic fusion are noted. IMPRESSION: Mild bilateral infrahilar opacities which may reflect atelectasis or pneumonia. Electronically Signed   By: Sebastian Ache M.D.   On: 09/24/2018 20:42     LOS: 4 days   Jeoffrey Massed, MD  [facility]  If 7PM-7AM, please contact night-coverage  Please page via [email or web address] [email or web address]-click on MD name and type text message  09/28/2018, 9:55 AM

## 2018-09-28 NOTE — Progress Notes (Signed)
Patient requested pain med for back pain. RN attempted to give patient PRN tylenol. Patient immediately vomited the pills and liquid soon after swallowing the medicine. Pt says she's not nauseated and does not feel like she is going to vomit anymore. Patient requested to be left alone to rest. Bed in lowest position and patient resting comfortably at this time with no signs of pain or distress. Will continue to monitor and treat per MD orders.

## 2018-09-29 ENCOUNTER — Inpatient Hospital Stay (HOSPITAL_COMMUNITY): Payer: Medicaid Other

## 2018-09-29 DIAGNOSIS — A419 Sepsis, unspecified organism: Secondary | ICD-10-CM

## 2018-09-29 DIAGNOSIS — R6521 Severe sepsis with septic shock: Secondary | ICD-10-CM

## 2018-09-29 LAB — GLUCOSE, CAPILLARY
Glucose-Capillary: 68 mg/dL — ABNORMAL LOW (ref 70–99)
Glucose-Capillary: 68 mg/dL — ABNORMAL LOW (ref 70–99)

## 2018-09-29 LAB — CULTURE, BLOOD (ROUTINE X 2)
CULTURE: NO GROWTH
Culture: NO GROWTH

## 2018-09-29 LAB — CBC
HCT: 38.2 % (ref 36.0–46.0)
Hemoglobin: 12.9 g/dL (ref 12.0–15.0)
MCH: 26.4 pg (ref 26.0–34.0)
MCHC: 33.8 g/dL (ref 30.0–36.0)
MCV: 78.1 fL — AB (ref 80.0–100.0)
NRBC: 0.5 % — AB (ref 0.0–0.2)
PLATELETS: 41 10*3/uL — AB (ref 150–400)
RBC: 4.89 MIL/uL (ref 3.87–5.11)
RDW: 19 % — ABNORMAL HIGH (ref 11.5–15.5)
WBC: 8.1 10*3/uL (ref 4.0–10.5)

## 2018-09-29 LAB — BASIC METABOLIC PANEL
ANION GAP: 6 (ref 5–15)
BUN: 10 mg/dL (ref 6–20)
CO2: 20 mmol/L — ABNORMAL LOW (ref 22–32)
Calcium: 7.8 mg/dL — ABNORMAL LOW (ref 8.9–10.3)
Chloride: 117 mmol/L — ABNORMAL HIGH (ref 98–111)
Creatinine, Ser: 0.99 mg/dL (ref 0.44–1.00)
GFR calc Af Amer: >60 mL/min (ref >60)
GLUCOSE: 118 mg/dL — AB (ref 70–99)
POTASSIUM: 2.2 mmol/L — AB (ref 3.5–5.1)
Sodium: 143 mmol/L (ref 135–145)

## 2018-09-29 LAB — POTASSIUM
POTASSIUM: 2.6 mmol/L — AB (ref 3.5–5.1)
POTASSIUM: 2.6 mmol/L — AB (ref 3.5–5.1)

## 2018-09-29 LAB — MAGNESIUM: MAGNESIUM: 1.9 mg/dL (ref 1.7–2.4)

## 2018-09-29 MED ORDER — ONDANSETRON HCL 4 MG/2ML IJ SOLN: 4.0000 mg | Freq: Four times a day (QID) | INTRAMUSCULAR | Status: DC | PRN

## 2018-09-29 MED ORDER — DEXTROSE-NACL 5-0.45 % IV SOLN: INTRAVENOUS | Status: DC

## 2018-09-29 MED ORDER — LACTATED RINGERS IV SOLN
INTRAVENOUS | Status: DC
  Administered 2018-09-29: 20:00:00 via INTRAVENOUS

## 2018-09-29 MED ORDER — POTASSIUM CHLORIDE 20 MEQ/15ML (10%) PO SOLN
40.0000 meq | Freq: Once | ORAL | Status: AC
  Administered 2018-09-29: 40 meq via ORAL
  Filled 2018-09-29: qty 30

## 2018-09-29 MED ORDER — DEXTROSE-NACL 5-0.45 % IV SOLN
INTRAVENOUS | Status: AC
  Administered 2018-09-29: 23:00:00 via INTRAVENOUS

## 2018-09-29 MED ORDER — SODIUM CHLORIDE 0.9 % IV SOLN
INTRAVENOUS | Status: DC
  Administered 2018-09-29 (×2): via INTRAVENOUS
  Filled 2018-09-29 (×3): qty 1000

## 2018-09-29 MED ORDER — POTASSIUM CHLORIDE 10 MEQ/100ML IV SOLN
10.0000 meq | INTRAVENOUS | Status: AC
  Administered 2018-09-29 – 2018-09-30 (×6): 10 meq via INTRAVENOUS
  Filled 2018-09-29 (×6): qty 100

## 2018-09-29 MED ORDER — MAGNESIUM SULFATE IN D5W 1-5 GM/100ML-% IV SOLN
1.0000 g | Freq: Once | INTRAVENOUS | Status: AC
  Administered 2018-09-29: 1 g via INTRAVENOUS
  Filled 2018-09-29: qty 100

## 2018-09-29 MED ORDER — POTASSIUM CHLORIDE 20 MEQ/15ML (10%) PO SOLN: 40.0000 meq | Freq: Once | ORAL | Status: DC

## 2018-09-29 MED ORDER — HYDRALAZINE HCL 20 MG/ML IJ SOLN
10.0000 mg | INTRAMUSCULAR | Status: DC | PRN
  Administered 2018-09-29: 10 mg via INTRAVENOUS
  Filled 2018-09-29: qty 1

## 2018-09-29 NOTE — Progress Notes (Signed)
CRITICAL VALUE ALERT  Critical Value:  K 2.2  Date & Time Notied:  09/29/2018; 07:55  Provider Notified: Dr. Thedore Mins  Orders Received/Actions taken:

## 2018-09-29 NOTE — Progress Notes (Signed)
PROGRESS NOTE        PATIENT DETAILS Name: Amber [name] Age: 62 y.o. Sex: female Date of Birth: 1956/09/25 Admit Date: 09/24/2018 Admitting Physician Ileana Roup, MD PCP:Sun, Charyl Dancer, MD  Brief Narrative: Patient is a 62 y.o. female with history of PAF (not on anticoagulation due to recent GI bleeding in October 2019), seizures, hypertension, COPD, failure to thrive syndrome admitted by CCM on 11/5 with severe sepsis secondary to E. coli UTI and associated metabolic encephalopathy.  Upon stability transfer to the trial hospitalist service on 11/9.  Subjective: Vision in bed, says he feels sleepy and tired, denies any headache chest or abdominal pain.  States she feels weak all over  Assessment/Plan:  Severe sepsis secondary to E. coli UTI: Sepsis pathophysiology has resolved-no longer on IV antibiotics-has been transition to Keflex.  Blood cultures on 11/5- so far, urine culture on 11/6+ for E. coli.  Given severity of symptoms on admission-with at least plan on 7 to 10 days of total antimicrobial therapy.   Acute metabolic encephalopathy: Likely due to above along with hospital-acquired delirium, she had a prolonged hospitalization at [facility] few days ago and was encephalopathic there as well, discussed with daughter on 09/29/2018.  Head CT and EEG here are unremarkable, will also check an MRI to rule out an occult stroke, for now minimize narcotics and benzodiazepines.  Continue home dose Depakote for history of years, if delirium becomes an issue we will try Haldol instead of benzodiazepines.  Seizure disorder: Seizure-free since admission-EEG negative for seizures.  Continue Depakote.  PAF [place] vas 2 score of at least 3: Sinus rhythm with some runs of A. fib overnight.  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  Anemia: Suspect secondary to  critical illness-significant drop of hemoglobin on 11/5  to 5.9 requiring PRBC transfusion.  Hemoglobin currently stable at 11.8.  No evidence of GI bleeding/overt blood loss.  Follow.  Thrombocytopenia: Suspect this is secondary to sepsis-no evidence of bleeding-appears mild at present-follow.  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  Continue PPI.  No longer on anticoagulation since discharge from [facility] hospital.  Hypokalemia/hypomagnesemia: Potassium aggressively replaced, magnesium being monitored and stable.  ?  Tardive dyskinesia: Does have some lipsmacking/facial movement at times-appears to be a chronic issue-EEG negative.   History of COPD: Lungs are clear-continue with bronchodilators.  She appears to be on chronic steroids which have been continued.  History of CVA: No longer on apixaban-given recent GI bleeding.  Has generalized weakness but appears to be nonfocal.  Failure to thrive syndrome: Appears very frail and elderly-apparently has had recurrent hospitalization this year-no family at bedside-continue PT/OT/SLP eval.  Will consult nutrition/dietitian.  Probably will require SNF on discharge.  DVT Prophylaxis: SCD's-due to thrombocytopenia  Code Status: Full code  Family Communication: Daughter over the phone on 09/29/2018  Disposition Plan: Remain inpatient-SNF on discharge-probably early next week.  Antimicrobial agents: Anti-infectives (From admission, onward)   Start     Dose/Rate Route Frequency Ordered Stop   09/27/18 1400  cephALEXin (KEFLEX) capsule 500 mg     500 mg  Oral Every 8 hours 09/27/18 0952     09/25/18 0900  vancomycin (VANCOCIN) 500 mg in sodium chloride 0.9 % 100 mL IVPB  Status:  Discontinued     500  mg 100 mL/hr over 60 Minutes Intravenous Every 12 hours 09/24/18 2105 09/26/18 1135   09/25/18 0200  piperacillin-tazobactam (ZOSYN) IVPB 3.375 g  Status:  Discontinued     3.375 g 12.5 mL/hr over 240 Minutes Intravenous Every 8 hours 09/25/18 0044 09/27/18 0952   09/24/18 2000  piperacillin-tazobactam (ZOSYN) IVPB 2.25 g  Status:  Discontinued     2.25 g 100 mL/hr over 30 Minutes Intravenous  Once 09/24/18 1950 09/24/18 1953   09/24/18 2000  piperacillin-tazobactam (ZOSYN) IVPB 3.375 g     3.375 g 100 mL/hr over 30 Minutes Intravenous  Once 09/24/18 1954 09/24/18 2116   09/24/18 2000  vancomycin (VANCOCIN) IVPB 1000 mg/200 mL premix     1,000 mg 200 mL/hr over 60 Minutes Intravenous  Once 09/24/18 1954 09/24/18 2138      Procedures: 11/6>> echo:EF 65-70%  CONSULTS: PCCM  Time spent: 25- minutes-Greater than 50% of this time was spent in counseling, explanation of diagnosis, planning of further management, and coordination of care.  MEDICATIONS: Scheduled Meds: . atorvastatin  10 mg Oral q1800  . cephALEXin  500 mg Oral Q8H  . chlorhexidine  15 mL Mouth Rinse BID  . mouth rinse  15 mL Mouth Rinse q12n4p  . pantoprazole  40 mg Oral Daily  . predniSONE  5 mg Oral TID  . umeclidinium bromide  1 puff Inhalation Daily  . valproic acid  500 mg Oral TID   Continuous Infusions: . magnesium sulfate 1 - 4 g bolus IVPB    . 0.9 % sodium chloride with kcl 125 mL/hr at 09/29/18 0843   PRN Meds:.acetaminophen, albuterol, hydrALAZINE   PHYSICAL EXAM: Vital signs: Vitals:   09/28/18 1457 09/29/18 0538 09/29/18 0548 09/29/18 0657  BP: (!) 128/99 (!) 148/108  97/77  Pulse: 100 98  92  Resp: 16     Temp: 97.6 F (36.4 C) 97.6 F (36.4 C)    TempSrc:      SpO2: 97% 100%    Weight:   45.7 kg   Height:       Filed Weights   09/25/18 0452 09/27/18 0500 09/29/18 0548  Weight: 42.7 kg 45.4 kg 45.7 kg   Body mass index is 19.68 kg/m.   Exam  Somnolent, will answer basic  questions, denies any headache, in no distress, moving all 4 extremities but minimally [place].AT,PERRAL Supple Neck,No JVD, No cervical lymphadenopathy appriciated.  Symmetrical Chest wall movement, Good air movement bilaterally, CTAB RRR,No Gallops, Rubs or new Murmurs, No Parasternal Heave +ve B.Sounds, Abd Soft, No tenderness, No organomegaly appriciated, No rebound - guarding or rigidity. No Cyanosis, Clubbing or edema, No new Rash or bruise   I have personally reviewed following labs and imaging studies  LABORATORY DATA:  CBC: Recent Labs  Lab 09/24/18 1924  09/25/18 0339 09/26/18 0304 09/26/18 1424 09/27/18 0623 09/28/18 0439 09/29/18 0602  WBC 7.6  --  7.3 6.3  --  6.8 5.5 8.1  NEUTROABS 3.6  --   --  4.8  --  4.3 3.5  --   HGB 8.8*   < > 8.0* 5.9* 9.5* 10.2* 11.8* 12.9  HCT 27.4*   < > 25.5* 17.8* 29.0* 31.2* 34.9* 38.2  MCV 81.5  --  83.3 80.5  --  79.8* 79.5* 78.1*  PLT  PLATELET CLUMPS NOTED ON SMEAR, COUNT APPEARS DECREASED  --  81* 72*  --  72* 70* 41*   < > = values in this interval not displayed.    Basic Metabolic Panel: Recent Labs  Lab 09/25/18 0339 09/26/18 0304 09/27/18 0623 09/28/18 0439 09/29/18 0602 09/29/18 0818  NA 143 145 148* 142 143  --   K 4.0 3.7 2.8* 3.2* 2.2* 2.6*  CL 121* 124* 120* 116* 117*  --   CO2 15* 17* 22 19* 20*  --   GLUCOSE 142* 138* 71 84 118*  --   BUN 10 10 11 8 10   --   CREATININE 0.91 1.05* 1.02* 0.94 0.99  --   CALCIUM 7.5* 7.1* 7.7* 7.5* 7.8*  --   MG 1.4* 1.8  --  1.4* 1.9  --   PHOS 3.9  --   --   --   --   --     GFR: Estimated Creatinine Clearance: 43.4 mL/min (by C-G formula based on SCr of 0.99 mg/dL).  Liver Function Tests: Recent Labs  Lab 09/26/18 0304 09/27/18 0623 09/28/18 0439  AST 25 37 42*  ALT 11 13 18   ALKPHOS 97 100 113  BILITOT 0.4 0.9 0.7  PROT 4.2* 5.1* 5.5*  ALBUMIN 1.1* 1.3* 1.4*   No results for input(s): LIPASE, AMYLASE in the last 168 hours. No results for input(s): AMMONIA in  the last 168 hours.  Coagulation Profile: No results for input(s): INR, PROTIME in the last 168 hours.  Cardiac Enzymes: No results for input(s): CKTOTAL, CKMB, CKMBINDEX, TROPONINI in the last 168 hours.  BNP (last 3 results) No results for input(s): PROBNP in the last 8760 hours.  HbA1C: No results for input(s): HGBA1C in the last 72 hours.  CBG: Recent Labs  Lab 09/24/18 1908 09/25/18 2141 09/27/18 2218  GLUCAP 92 149* 111*    Lipid Profile: No results for input(s): CHOL, HDL, LDLCALC, TRIG, CHOLHDL, LDLDIRECT in the last 72 hours.  Thyroid Function Tests: No results for input(s): TSH, T4TOTAL, FREET4, T3FREE, THYROIDAB in the last 72 hours.  Anemia Panel: No results for input(s): VITAMINB12, FOLATE, FERRITIN, TIBC, IRON, RETICCTPCT in the last 72 hours.  Urine analysis:    Component Value Date/Time   COLORURINE YELLOW 09/24/2018 2340   APPEARANCEUR CLEAR 09/24/2018 2340   LABSPEC 1.009 09/24/2018 2340   PHURINE 6.0 09/24/2018 2340   GLUCOSEU NEGATIVE 09/24/2018 2340   HGBUR NEGATIVE 09/24/2018 2340   BILIRUBINUR NEGATIVE 09/24/2018 2340   KETONESUR NEGATIVE 09/24/2018 2340   PROTEINUR NEGATIVE 09/24/2018 2340   UROBILINOGEN 1.0 07/27/2015 1052   NITRITE NEGATIVE 09/24/2018 2340   LEUKOCYTESUR TRACE (A) 09/24/2018 2340    Sepsis Labs: Lactic Acid, Venous    Component Value Date/Time   LATICACIDVEN 1.7 09/27/2018 13/03/2018    MICROBIOLOGY: Recent Results (from the past 240 hour(s))  Blood culture (routine x 2)     Status: None   Collection Time: 09/24/18  7:40 PM  Result Value Ref Range Status   Specimen Description BLOOD BLOOD RIGHT FOREARM  Final   Special Requests   Final    BOTTLES DRAWN AEROBIC AND ANAEROBIC Blood Culture results may not be optimal due to an inadequate volume of blood received in culture bottles   Culture   Final    NO GROWTH 5 DAYS Performed at [facility], [address] [place], [address] [place]    Report Status  09/29/2018 FINAL  Final  Blood culture (routine x 2)  Status: None   Collection Time: 09/24/18  7:45 PM  Result Value Ref Range Status   Specimen Description BLOOD LEFT ANTECUBITAL  Final   Special Requests   Final    BOTTLES DRAWN AEROBIC AND ANAEROBIC Blood Culture results may not be optimal due to an inadequate volume of blood received in culture bottles   Culture   Final    NO GROWTH 5 DAYS Performed at [facility], [address] [place], [place] [address]    Report Status 09/29/2018 FINAL  Final  Urine culture     Status: Abnormal   Collection Time: 09/25/18 12:11 AM  Result Value Ref Range Status   Specimen Description URINE, RANDOM  Final   Special Requests   Final    NONE Performed at [facility], [address], [place], [place] [address]    Culture >=100,000 COLONIES/mL ESCHERICHIA COLI (A)  Final   Report Status 09/27/2018 FINAL  Final   Organism ID, Bacteria ESCHERICHIA COLI (A)  Final      Susceptibility   Escherichia coli - MIC*    AMPICILLIN >=32 RESISTANT Resistant     CEFAZOLIN <=4 SENSITIVE Sensitive     CEFTRIAXONE <=1 SENSITIVE Sensitive     CIPROFLOXACIN >=4 RESISTANT Resistant     GENTAMICIN <=1 SENSITIVE Sensitive     IMIPENEM <=0.25 SENSITIVE Sensitive     NITROFURANTOIN <=16 SENSITIVE Sensitive     TRIMETH/SULFA <=20 SENSITIVE Sensitive     AMPICILLIN/SULBACTAM >=32 RESISTANT Resistant     PIP/TAZO <=4 SENSITIVE Sensitive     Extended ESBL NEGATIVE Sensitive     * >=100,000 COLONIES/mL ESCHERICHIA COLI  Respiratory Panel by PCR     Status: None   Collection Time: 09/25/18 12:46 AM  Result Value Ref Range Status   Adenovirus NOT DETECTED NOT DETECTED Final   Coronavirus 229E NOT DETECTED NOT DETECTED Final   Coronavirus HKU1 NOT DETECTED NOT DETECTED Final   Coronavirus NL63 NOT DETECTED NOT DETECTED Final   Coronavirus OC43 NOT DETECTED NOT DETECTED Final   Metapneumovirus NOT DETECTED NOT DETECTED Final   Rhinovirus /  Enterovirus NOT DETECTED NOT DETECTED Final   Influenza A NOT DETECTED NOT DETECTED Final   Influenza B NOT DETECTED NOT DETECTED Final   Parainfluenza Virus 1 NOT DETECTED NOT DETECTED Final   Parainfluenza Virus 2 NOT DETECTED NOT DETECTED Final   Parainfluenza Virus 3 NOT DETECTED NOT DETECTED Final   Parainfluenza Virus 4 NOT DETECTED NOT DETECTED Final   Respiratory Syncytial Virus NOT DETECTED NOT DETECTED Final   Bordetella pertussis NOT DETECTED NOT DETECTED Final   Chlamydophila pneumoniae NOT DETECTED NOT DETECTED Final   Mycoplasma pneumoniae NOT DETECTED NOT DETECTED Final    Comment: Performed at [facility], [address] [place], [place] [address]  MRSA PCR Screening     Status: None   Collection Time: 09/25/18 12:46 AM  Result Value Ref Range Status   MRSA by PCR NEGATIVE NEGATIVE Final    Comment:        The GeneXpert MRSA Assay (FDA approved for NASAL specimens only), is one component of a comprehensive MRSA colonization surveillance program. It is not intended to diagnose MRSA infection nor to guide or monitor treatment for MRSA infections. Performed at [facility], [address] [place], [place] [address]     RADIOLOGY STUDIES/RESULTS: Ct Head Wo Contrast  Result Date: 09/26/2018 CLINICAL DATA:  Unexplained altered level of consciousness EXAM: CT HEAD  WITHOUT CONTRAST TECHNIQUE: Contiguous axial images were obtained from the base of the skull through the vertex without intravenous contrast. COMPARISON:  07/15/2018 FINDINGS: Brain: No evidence of acute infarction, hemorrhage, hydrocephalus, extra-axial collection or mass lesion/mass effect. Moderate remote infarct in the posterior right frontal lobe. Chronic small vessel ischemia in the cerebral white matter with remote lacunar infarcts in the right caudate and bilateral thalamus. Small remote bilateral cerebellar infarcts. Age advanced cerebral volume loss. Vascular: Atherosclerotic  calcification.  No hyperdense vessel. Skull: Negative Sinuses/Orbits: Negative IMPRESSION: No acute finding. Chronic ischemic injury as described. Electronically Signed   By: Marnee Spring M.D.   On: 09/26/2018 15:27   Dg Chest Port 1 View  Result Date: 09/25/2018 CLINICAL DATA:  Respiratory failure. EXAM: PORTABLE CHEST 1 VIEW COMPARISON:  Radiograph of September 24, 2018. FINDINGS: Stable cardiomediastinal silhouette. No pneumothorax is noted. Old right rib fractures are noted. Mild bibasilar subsegmental atelectasis or infiltrates is noted. Status post surgical posterior fusion of thoracic spine. IMPRESSION: Mild bibasilar subsegmental atelectasis or infiltrates. Electronically Signed   By: Lupita Raider, M.D.   On: 09/25/2018 07:47   Dg Chest Portable 1 View  Result Date: 09/24/2018 CLINICAL DATA:  Lethargy. Recent hospitalization for pneumonia and sepsis. EXAM: PORTABLE CHEST 1 VIEW COMPARISON:  Chest CT 09/09/2018 and radiograph 09/06/2018 FINDINGS: The cardiac silhouette is upper limits of normal in size. There is mild chronic interstitial coarsening. The lungs are mildly hypoinflated with mild bilateral infrahilar opacity. No sizable pleural effusion or pneumothorax is identified. A well-defined lucent lesion in the proximal right humerus is chronic. There are degenerative changes at both shoulders. Right upper quadrant abdominal surgical clips and prior thoracic fusion are noted. IMPRESSION: Mild bilateral infrahilar opacities which may reflect atelectasis or pneumonia. Electronically Signed   By: Sebastian Ache M.D.   On: 09/24/2018 20:42     LOS: 5 days   Signature  Susa Raring M.D on 09/29/2018 at 12:44 PM   -  To page go to [email or web address] - password [facility]

## 2018-09-29 NOTE — Progress Notes (Signed)
Order received to give patient K+ 40 meq solution for K+ of 2.6.  P.J. Henderson Newcomer, RN

## 2018-09-29 NOTE — Progress Notes (Signed)
CBG gotten in error, not ordered, but result was 68.  RN attempted to give patient oral intake to increase sugar, but result still 68.  Dr. Caleb Popp paged to make him aware and see if he wants to treat low blood sugar, awaiting response.  P.J. Henderson Newcomer, RN

## 2018-09-29 NOTE — Progress Notes (Signed)
CRITICAL VALUE ALERT  Critical Value:  K+ 2.6  Date & Time Notied:  09/29/2018 1925  Provider Notified: Dr. Tish Frederickson  Orders Received/Actions taken: awaiting response

## 2018-09-29 NOTE — Evaluation (Signed)
Occupational Therapy Evaluation Patient Details Name: Amber [name] MRN: [id number] DOB: [birth year] Today's Date: 09/29/2018    History of Present Illness 62 y.o. female admitted 11/5 with hypotension and AMS now being treated for PNA and Sepsis. PMH includes: CVA, HTN, Anemia, GERD, Seizures, , ACS, Frequent Falls, Back surgery.     Clinical Impression   Pt is assisted for ADL and IADL by her son and/or and aide 7 days a week and walks with a walker at baseline. She lives with her grandson. Pt presents with lethargy and limited ability to participate in evaluation this visit. Will follow acutely.    Follow Up Recommendations  SNF;Supervision/Assistance - 24 hour    Equipment Recommendations       Recommendations for Other Services       Precautions / Restrictions Precautions Precautions: Fall Restrictions Weight Bearing Restrictions: No      Mobility Bed Mobility Overal bed mobility: Needs Assistance Bed Mobility: Supine to Sit;Sit to Supine     Supine to sit: Max assist Sit to supine: Max assist   General bed mobility comments: pt with minimal attempt to participate  Transfers                 General transfer comment: deferred due to lethargy     Balance Overall balance assessment: Needs assistance   Sitting balance-Leahy Scale: Poor                                     ADL either performed or assessed with clinical judgement   ADL                                         General ADL Comments: pt unable to participate in ADL due to lethargy this session     Vision Patient Visual Report: No change from baseline       Perception     Praxis      Pertinent Vitals/Pain Pain Assessment: Faces Faces Pain Scale: No hurt     Hand Dominance Right   Extremity/Trunk Assessment Upper Extremity Assessment Upper Extremity Assessment: Generalized weakness   Lower Extremity Assessment Lower Extremity Assessment:  Generalized weakness       Communication Communication Communication: Expressive difficulties(pt minimally conversing)   Cognition Arousal/Alertness: Lethargic Behavior During Therapy: Flat affect Overall Cognitive Status: Impaired/Different from baseline Area of Impairment: Following commands;Orientation                 Orientation Level: Time;Situation     Following Commands: Follows one step commands inconsistently;Follows one step commands with increased time       General Comments: pt with lethargy, difficult to assess   General Comments       Exercises     Shoulder Instructions      Home Living Family/patient expects to be discharged to:: Skilled nursing facility Living Arrangements: Other relatives                               Additional Comments: lives with grandson and son aroudn the corner, has aide come 7x a week, has help from son with ADL and IADls, ambulates with RW      Prior Functioning/Environment Level of Independence: Needs assistance  Gait / Transfers Assistance Needed:  pt reports she ambulates with rollator ADL's / Homemaking Assistance Needed: Per pt report, personal care assistant assists with ADL tasks 7 days/week, 3 hours/day            OT Problem List: Decreased strength;Decreased activity tolerance;Impaired balance (sitting and/or standing);Decreased cognition;Decreased knowledge of use of DME or AE      OT Treatment/Interventions: Self-care/ADL training;DME and/or AE instruction;Cognitive remediation/compensation;Patient/family education;Balance training;Therapeutic activities    OT Goals(Current goals can be found in the care plan section) Acute Rehab OT Goals Patient Stated Goal: be left alone OT Goal Formulation: Patient unable to participate in goal setting Time For Goal Achievement: 10/13/18 Potential to Achieve Goals: Fair ADL Goals Pt Will Perform Eating: with set-up;sitting Pt Will Perform Grooming:  with set-up;sitting Pt Will Perform Upper Body Bathing: with min assist;sitting Pt Will Perform Upper Body Dressing: with min assist;sitting Pt Will Transfer to Toilet: with min assist;stand pivot transfer;bedside commode Pt Will Perform Toileting - Clothing Manipulation and hygiene: with mod assist;sit to/from stand  OT Frequency: Min 2X/week   Barriers to D/C:            Co-evaluation              AM-PAC PT "6 Clicks" Daily Activity     Outcome Measure Help from another person eating meals?: Total Help from another person taking care of personal grooming?: Total Help from another person toileting, which includes using toliet, bedpan, or urinal?: Total Help from another person bathing (including washing, rinsing, drying)?: Total Help from another person to put on and taking off regular upper body clothing?: Total Help from another person to put on and taking off regular lower body clothing?: Total 6 Click Score: 6   End of Session    Activity Tolerance: Patient limited by fatigue;Patient limited by lethargy Patient left: in bed;with call bell/phone within reach;with bed alarm set  OT Visit Diagnosis: Unsteadiness on feet (R26.81);Other abnormalities of gait and mobility (R26.89);Muscle weakness (generalized) (M62.81);Other symptoms and signs involving cognitive function                Time: 1856-3149 OT Time Calculation (min): 20 min Charges:  OT General Charges $OT Visit: 1 Visit OT Evaluation $OT Eval Moderate Complexity: 1 Mod  Martie Round, OTR/L Acute Rehabilitation Services Pager: [phone] Office: [phone]  Evern Bio 09/29/2018, 3:22 PM

## 2018-09-29 NOTE — Progress Notes (Signed)
RN paged Dr. Caleb Popp to make him aware that patient vomited part of night time medications including oral K+ and Depakene, awaiting response.  P.J. Henderson Newcomer, RN

## 2018-09-29 NOTE — Progress Notes (Signed)
CRITICAL VALUE ALERT  Critical Value:  K 2.6  Date & Time Notied:  09/29/2018; 09:20  Provider Notified: Dr. Thedore Mins  Orders Received/Actions taken:  See [facility]

## 2018-09-29 NOTE — Progress Notes (Signed)
RN paged Dr. Antionette Char to make him aware patient's BP is 148/108, awaiting response.  P.J. Henderson Newcomer, RN

## 2018-09-30 LAB — BASIC METABOLIC PANEL
Anion gap: 4 — ABNORMAL LOW (ref 5–15)
BUN: 11 mg/dL (ref 6–20)
CO2: 17 mmol/L — AB (ref 22–32)
CREATININE: 0.82 mg/dL (ref 0.44–1.00)
Calcium: 7.4 mg/dL — ABNORMAL LOW (ref 8.9–10.3)
Chloride: 122 mmol/L — ABNORMAL HIGH (ref 98–111)
GFR calc non Af Amer: >60 mL/min (ref >60)
GLUCOSE: 91 mg/dL (ref 70–99)
Potassium: 4 mmol/L (ref 3.5–5.1)
SODIUM: 143 mmol/L (ref 135–145)

## 2018-09-30 LAB — MAGNESIUM: MAGNESIUM: 2.1 mg/dL (ref 1.7–2.4)

## 2018-09-30 MED ORDER — ENSURE ENLIVE PO LIQD
237.0000 mL | Freq: Two times a day (BID) | ORAL | Status: DC
  Administered 2018-09-30: 237 mL via ORAL

## 2018-09-30 MED ORDER — ADULT MULTIVITAMIN W/MINERALS CH
1.0000 | ORAL_TABLET | Freq: Every day | ORAL | Status: DC
  Administered 2018-09-30 – 2018-10-01 (×2): 1 via ORAL
  Filled 2018-09-30 (×2): qty 1

## 2018-09-30 NOTE — Progress Notes (Signed)
Nutrition Follow-up  DOCUMENTATION CODES:   Not applicable  INTERVENTION:  -Ensure Enlive BID. Each supplement provides 350 kcal and 20 grams protein. Patient likes vanilla flavor   -MVI daily   NUTRITION DIAGNOSIS:   Inadequate oral intake related to decreased appetite as evidenced by per patient/family report.  GOAL:   Patient will meet greater than or equal to 90% of their needs  MONITOR:   PO intake, Supplement acceptance, Labs, Weight trends, Skin  REASON FOR ASSESSMENT:   Low Braden    ASSESSMENT:   62 yo female with PMH of asthma, HTN, GERD, constipation, DM, chronic pain, frequent falls, CVA who was admitted on 11/5 with hypotension and AMS.  Visited pt in room today. She is curled up under blankets on reclining chair. Pt awake but somnolent.  Pt reports that she has had decreased appetite recently, though does not know why. She said this has been going on for past 4 weeks.  Pt lives in house with sister. Reports that her sister and grand-daughter cook for her. Pt able to say that she eats bacon and eggs in the morning, but trails off in middle of sentences during rest of recall due to drowsiness. When asked if she likes Ensure she says "Yes I love them, get me one of those right now". She says she likes vanilla flavor.   Pt with 13 kg wt loss since 9/11 (22 % body wt) which is significant for the time frame. However pt with significant alterations in fluid status.   Pt's current diet order is regular; she consumed 100% of breakfast this morning. Limited meal completion available for rest of hospitalization since 11/5. Pt was NPO or full liquid prior to today 11/11. Will provide Ensure Enlive to make sure pt is meeting needs during hospitalization.   Meds: Protonix, prednisone Labs: CBGs 68-149   NUTRITION - FOCUSED PHYSICAL EXAM:    Most Recent Value  Orbital Region  No depletion  Upper Arm Region  Moderate depletion  Thoracic and Lumbar Region  Unable to  assess  Buccal Region  No depletion  Temple Region  Mild depletion  Clavicle Bone Region  Mild depletion  Clavicle and Acromion Bone Region  Mild depletion  Scapular Bone Region  Unable to assess  Dorsal Hand  Mild depletion  Patellar Region  Mild depletion  Anterior Thigh Region  Mild depletion  Posterior Calf Region  Mild depletion  Edema (RD Assessment)  Mild  Hair  Reviewed  Eyes  Reviewed  Mouth  Reviewed  Skin  Reviewed  Nails  Reviewed       Diet Order:   Diet Order            Diet regular Room service appropriate? Yes; Fluid consistency: Thin  Diet effective now              EDUCATION NEEDS:   No education needs have been identified at this time  Skin:  Skin Assessment: (MASD to buttocks, back, groin, labia)  Last BM:  11/5  Height:   Ht Readings from Last 1 Encounters:  09/24/18 5' (1.524 m)    Weight:   Wt Readings from Last 1 Encounters:  09/30/18 45.9 kg    Ideal Body Weight:  45.5 kg  BMI:  Body mass index is 19.76 kg/m.  Estimated Nutritional Needs:   Kcal:  1300-1500  Protein:  65-75 gm  Fluid:  >/=1.3 L    Jackey Housey, Dietetic Intern [phone]

## 2018-09-30 NOTE — Care Management Note (Addendum)
Case Management Note  Patient Details  Name: Amber [name] MRN: [id number] Date of Birth: 1956-11-03  Subjective/Objective:           HCAP        [name] [address] [name] (Daughter)    [phone]     PCP: [name]  Action/Plan: NCM spoke with daughter, Suella Broad, regarding d/c plan. Declined SNF placement.Suella Broad states mom will d/c to home with family, agreeable to home health services ( RN,PT,SW).HH orders and F2F will be needed froom MD. Pt with good family support ... daughter, granddaughter and son. Granddaughter lives with pt. Family to ensure pt has 24/7 supervision/assistance @ d/c. [facility] provides 2-3 PCS to pt daily. Transportation to home will be provided by family.   Expected Discharge Date:   10/01/2018           Expected Discharge Plan:  Home w Home Health Services  In-House Referral:     Discharge planning Services  CM Consult  Post Acute Care Choice:    Choice offered to:  Patient, Adult Children  DME Arranged:   hospital bed, hoyer lift and pad, and wheelchair DME Agency:    [facility]  HH Arranged:  PT HH Agency:  [facility]  Status of Service:  Completed, signed off  If discussed at Long Length of Stay Meetings, dates discussed:    Additional Comments: Per [facility] referral  for home health services, they will not  be able to accept pt 2/2 no home support and ? exposure  to elicit drugs. Gae Gallop [place], RN 09/30/2018, 6:54 PM

## 2018-09-30 NOTE — Progress Notes (Signed)
Physical Therapy Treatment Patient Details Name: Amber [name] MRN: [id number] DOB: [birth year] Today's Date: 09/30/2018    History of Present Illness 62 y.o. female admitted 11/5 with hypotension and AMS now being treated for PNA and Sepsis. MRI negative for acute abnormality.  PMH includes: CVA, HTN, Anemia, GERD, Seizures, , ACS, Frequent Falls, Back surgery.      PT Comments    Pt with increased arousal this session and stated she wanted to go home. Required max to total A to perform stand pivot to chair this session. Pt with difficulty sequencing and taking steps to recliner this session. Continue to feel SNF is most appropriate d/c location, however, per notes, family likely will take pt home. Will need DME below and max HH services if family decides to take pt home. Will continue to follow acutely to maximize functional mobility independence and safety.    Follow Up Recommendations  SNF     Equipment Recommendations  Hospital bed;Wheelchair (measurements PT);Wheelchair cushion (measurements PT);Other (comment)(hoyer lift, hoyer lift pad )    Recommendations for Other Services OT consult     Precautions / Restrictions Precautions Precautions: Fall Restrictions Weight Bearing Restrictions: No    Mobility  Bed Mobility Overal bed mobility: Needs Assistance Bed Mobility: Supine to Sit     Supine to sit: Mod assist     General bed mobility comments: Mod A for LE assist and trunk elevation.   Transfers Overall transfer level: Needs assistance   Transfers: Sit to/from Stand;Stand Pivot Transfers Sit to Stand: Max assist Stand pivot transfers: Total assist       General transfer comment: Max A to stand for lift assist and steadying. PT stood in front of pt and had pt hold to PT's arms. Pt with difficulty moving feet during transfer and unable to take steps during transfer to chair.   Ambulation/Gait             General Gait Details: unable    Stairs             Wheelchair Mobility    Modified Rankin (Stroke Patients Only)       Balance Overall balance assessment: Needs assistance Sitting-balance support: Feet supported Sitting balance-Leahy Scale: Fair     Standing balance support: Bilateral upper extremity supported;During functional activity Standing balance-Leahy Scale: Poor Standing balance comment: HEavy reliance on UE and external support                             Cognition Arousal/Alertness: Awake/alert Behavior During Therapy: Flat affect Overall Cognitive Status: Impaired/Different from baseline Area of Impairment: Following commands;Awareness;Problem solving                       Following Commands: Follows one step commands with increased time   Awareness: Emergent Problem Solving: Slow processing;Difficulty sequencing;Requires verbal cues General Comments: Pt with increased arousal, however, unaware of deficits. Difficulty sequencing noted with transfer to chair.       Exercises      General Comments General comments (skin integrity, edema, etc.): No family present during session       Pertinent Vitals/Pain Pain Assessment: Faces Faces Pain Scale: No hurt    Home Living                      Prior Function            PT Goals (current goals can  now be found in the care plan section) Acute Rehab PT Goals Patient Stated Goal: to go home  PT Goal Formulation: With patient Time For Goal Achievement: 10/10/18 Potential to Achieve Goals: Good Progress towards PT goals: Progressing toward goals    Frequency    Min 3X/week      PT Plan Current plan remains appropriate    Co-evaluation              AM-PAC PT "6 Clicks" Daily Activity  Outcome Measure  Difficulty turning over in bed (including adjusting bedclothes, sheets and blankets)?: Unable Difficulty moving from lying on back to sitting on the side of the bed? : Unable Difficulty sitting down on  and standing up from a chair with arms (e.g., wheelchair, bedside commode, etc,.)?: Unable Help needed moving to and from a bed to chair (including a wheelchair)?: Total Help needed walking in hospital room?: Total Help needed climbing 3-5 steps with a railing? : Total 6 Click Score: 6    End of Session Equipment Utilized During Treatment: Gait belt Activity Tolerance: Patient limited by fatigue Patient left: in chair;with call bell/phone within reach;with chair alarm set Nurse Communication: Mobility status PT Visit Diagnosis: Unsteadiness on feet (R26.81);Muscle weakness (generalized) (M62.81)     Time: 8366-2947 PT Time Calculation (min) (ACUTE ONLY): 26 min  Charges:  $Therapeutic Activity: 23-37 mins                     Gladys Damme, PT, DPT  Acute Rehabilitation Services  Pager: [phone] Office: [phone]    Lehman Prom 09/30/2018, 11:51 AM

## 2018-09-30 NOTE — Progress Notes (Signed)
CSW received consult regarding PT recommendation of SNF at discharge.  Patient is disoriented. CSW spoke with patient's daughter, Suella Broad. She is refusing SNF and would like for the patient to return home with home health. She inquired if patient would receive any therapy at home. CSW alerted her that Medicaid does not offer therapy, but she could receive a nurse aide. She reported agreement. RNCM aware.   CSW signing off.   Osborne Casco Allayah Raineri LCSW [phone]

## 2018-09-30 NOTE — Progress Notes (Signed)
PROGRESS NOTE        PATIENT DETAILS Name: Amber [name] Age: 62 y.o. Sex: female Date of Birth: 04-Feb-1956 Admit Date: 09/24/2018 Admitting Physician Ileana Roup, MD PCP:Sun, Charyl Dancer, MD  Brief Narrative: Patient is a 62 y.o. female with history of PAF (not on anticoagulation due to recent GI bleeding in October 2019), seizures, hypertension, COPD, failure to thrive syndrome admitted by CCM on 11/5 with severe sepsis secondary to E. coli UTI and associated metabolic encephalopathy.  Upon stability transfer to the trial hospitalist service on 11/9.  Subjective:  Patient in bed, appears comfortable, denies any headache, no fever, no chest pain or pressure, no shortness of breath , no abdominal pain. No focal weakness.   Assessment/Plan:  Severe sepsis secondary to E. coli UTI: Sepsis pathophysiology has resolved-no longer on IV antibiotics-has been transition to Keflex.  Blood cultures on 11/5- so far, urine culture on 11/6+ for E. coli.  Given severity of symptoms on admission-with at least plan on 7 days of total antimicrobial therapy.   Acute metabolic encephalopathy: Likely due to above along with hospital-acquired delirium, she had a prolonged hospitalization at [facility] few days ago and was encephalopathic there as well, discussed with daughter on 09/29/2018.  She had a unremarkable EEG along with head CT and MRI which were all nonacute this admission.  Her delirium is off and on and gradually improving, continue to avoid benzodiazepines and narcotics, if needed use PRN Haldol for agitation.  Overall delirium has improved will increase activity and prepare for discharge possibly 10/01/2018.  Seizure disorder: Seizure-free since admission-EEG negative for seizures.  Continue Depakote.  PAF [place] vas 2 score of at least 3: Sinus rhythm with some runs of A. fib overnight.  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  Anemia: Suspect secondary to critical illness-significant drop of hemoglobin on 11/5  to 5.9 requiring PRBC transfusion.  Hemoglobin currently stable at 11.8.  No evidence of GI bleeding/overt blood loss.  Follow.  Thrombocytopenia: Suspect this is secondary to sepsis-no evidence of bleeding-appears mild at present-follow.  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  Continue PPI.  No longer on anticoagulation since discharge from [facility] hospital.  Hypokalemia/hypomagnesemia: Potassium aggressively replaced, magnesium being monitored and stable.  ?  Tardive dyskinesia: Does have some lipsmacking/facial movement at times-appears to be a chronic issue-EEG negative.   History of COPD: Lungs are clear-continue with bronchodilators.  She appears to be on chronic steroids which have been continued.  History of CVA: No longer on apixaban-given recent GI bleeding.  Has generalized weakness but appears to be nonfocal.  Failure to thrive syndrome: Appears very frail and elderly-apparently has had recurrent hospitalization this year-no family at bedside-continue PT/OT/SLP eval.  Will consult nutrition/dietitian.  Probably will require SNF on discharge.    DVT Prophylaxis: SCD's-due to thrombocytopenia  Code Status: Full code  Family Communication: Daughter over the phone on 09/29/2018  Disposition Plan: Remain inpatient-SNF on discharge-probably early next week.  Antimicrobial agents: Anti-infectives (From admission, onward)   Start     Dose/Rate Route Frequency Ordered Stop  09/27/18 1400  cephALEXin (KEFLEX) capsule 500 mg     500 mg Oral Every 8 hours 09/27/18 0952     09/25/18 0900   vancomycin (VANCOCIN) 500 mg in sodium chloride 0.9 % 100 mL IVPB  Status:  Discontinued     500 mg 100 mL/hr over 60 Minutes Intravenous Every 12 hours 09/24/18 2105 09/26/18 1135   09/25/18 0200  piperacillin-tazobactam (ZOSYN) IVPB 3.375 g  Status:  Discontinued     3.375 g 12.5 mL/hr over 240 Minutes Intravenous Every 8 hours 09/25/18 0044 09/27/18 0952   09/24/18 2000  piperacillin-tazobactam (ZOSYN) IVPB 2.25 g  Status:  Discontinued     2.25 g 100 mL/hr over 30 Minutes Intravenous  Once 09/24/18 1950 09/24/18 1953   09/24/18 2000  piperacillin-tazobactam (ZOSYN) IVPB 3.375 g     3.375 g 100 mL/hr over 30 Minutes Intravenous  Once 09/24/18 1954 09/24/18 2116   09/24/18 2000  vancomycin (VANCOCIN) IVPB 1000 mg/200 mL premix     1,000 mg 200 mL/hr over 60 Minutes Intravenous  Once 09/24/18 1954 09/24/18 2138      Procedures: 11/6>> echo:EF 65-70%  CONSULTS: PCCM  Time spent: 25- minutes-Greater than 50% of this time was spent in counseling, explanation of diagnosis, planning of further management, and coordination of care.  MEDICATIONS: Scheduled Meds: . atorvastatin  10 mg Oral q1800  . cephALEXin  500 mg Oral Q8H  . chlorhexidine  15 mL Mouth Rinse BID  . mouth rinse  15 mL Mouth Rinse q12n4p  . pantoprazole  40 mg Oral Daily  . predniSONE  5 mg Oral TID  . umeclidinium bromide  1 puff Inhalation Daily  . valproic acid  500 mg Oral TID   Continuous Infusions:  PRN Meds:.acetaminophen, albuterol, hydrALAZINE, ondansetron (ZOFRAN) IV   PHYSICAL EXAM: Vital signs: Vitals:   09/29/18 1726 09/29/18 2152 09/30/18 0539 09/30/18 0845  BP: (!) 123/99 (!) 133/99 (!) 136/97   Pulse: 97 (!) 105 85   Resp: 17 20 18    Temp: 98.6 F (37 C) 98.4 F (36.9 C) 98.1 F (36.7 C)   TempSrc: Oral Oral Oral   SpO2: 97%  100% 99%  Weight:   45.9 kg   Height:       Filed Weights   09/27/18 0500 09/29/18 0548 09/30/18 0539  Weight: 45.4 kg 45.7 kg 45.9 kg   Body mass index  is 19.76 kg/m.   Exam  Awake Alert, Oriented X 3, No new F.N deficits, Normal affect, ++ generalized weakness [place].AT,PERRAL Supple Neck,No JVD, No cervical lymphadenopathy appriciated.  Symmetrical Chest wall movement, Good air movement bilaterally, CTAB RRR,No Gallops, Rubs or new Murmurs, No Parasternal Heave +ve B.Sounds, Abd Soft, No tenderness, No organomegaly appriciated, No rebound - guarding or rigidity. No Cyanosis, Clubbing or edema, No new Rash or bruise  I have personally reviewed following labs and imaging studies  LABORATORY DATA:  CBC: Recent Labs  Lab 09/24/18 1924  09/25/18 0339 09/26/18 0304 09/26/18 1424 09/27/18 0623 09/28/18 0439 09/29/18 0602  WBC 7.6  --  7.3 6.3  --  6.8 5.5 8.1  NEUTROABS 3.6  --   --  4.8  --  4.3 3.5  --   HGB 8.8*   < > 8.0* 5.9* 9.5* 10.2* 11.8* 12.9  HCT 27.4*   < > 25.5* 17.8* 29.0* 31.2* 34.9* 38.2  MCV 81.5  --  83.3 80.5  --  79.8* 79.5* 78.1*  PLT PLATELET CLUMPS NOTED ON SMEAR, COUNT APPEARS  DECREASED  --  81* 72*  --  72* 70* 41*   < > = values in this interval not displayed.    Basic Metabolic Panel: Recent Labs  Lab 09/25/18 0339 09/26/18 0304 09/27/18 0623 09/28/18 0439 09/29/18 0602 09/29/18 0818 09/29/18 1830 09/30/18 0515  NA 143 145 148* 142 143  --   --  143  K 4.0 3.7 2.8* 3.2* 2.2* 2.6* 2.6* 4.0  CL 121* 124* 120* 116* 117*  --   --  122*  CO2 15* 17* 22 19* 20*  --   --  17*  GLUCOSE 142* 138* 71 84 118*  --   --  91  BUN 10 10 11 8 10   --   --  11  CREATININE 0.91 1.05* 1.02* 0.94 0.99  --   --  0.82  CALCIUM 7.5* 7.1* 7.7* 7.5* 7.8*  --   --  7.4*  MG 1.4* 1.8  --  1.4* 1.9  --   --  2.1  PHOS 3.9  --   --   --   --   --   --   --     GFR: Estimated Creatinine Clearance: 52.4 mL/min (by C-G formula based on SCr of 0.82 mg/dL).  Liver Function Tests: Recent Labs  Lab 09/26/18 0304 09/27/18 0623 09/28/18 0439  AST 25 37 42*  ALT 11 13 18   ALKPHOS 97 100 113  BILITOT 0.4 0.9 0.7    PROT 4.2* 5.1* 5.5*  ALBUMIN 1.1* 1.3* 1.4*   No results for input(s): LIPASE, AMYLASE in the last 168 hours. No results for input(s): AMMONIA in the last 168 hours.  Coagulation Profile: No results for input(s): INR, PROTIME in the last 168 hours.  Cardiac Enzymes: No results for input(s): CKTOTAL, CKMB, CKMBINDEX, TROPONINI in the last 168 hours.  BNP (last 3 results) No results for input(s): PROBNP in the last 8760 hours.  HbA1C: No results for input(s): HGBA1C in the last 72 hours.  CBG: Recent Labs  Lab 09/24/18 1908 09/25/18 2141 09/27/18 2218 09/29/18 2110 09/29/18 2138  GLUCAP 92 149* 111* 68* 68*    Lipid Profile: No results for input(s): CHOL, HDL, LDLCALC, TRIG, CHOLHDL, LDLDIRECT in the last 72 hours.  Thyroid Function Tests: No results for input(s): TSH, T4TOTAL, FREET4, T3FREE, THYROIDAB in the last 72 hours.  Anemia Panel: No results for input(s): VITAMINB12, FOLATE, FERRITIN, TIBC, IRON, RETICCTPCT in the last 72 hours.  Urine analysis:    Component Value Date/Time   COLORURINE YELLOW 09/24/2018 2340   APPEARANCEUR CLEAR 09/24/2018 2340   LABSPEC 1.009 09/24/2018 2340   PHURINE 6.0 09/24/2018 2340   GLUCOSEU NEGATIVE 09/24/2018 2340   HGBUR NEGATIVE 09/24/2018 2340   BILIRUBINUR NEGATIVE 09/24/2018 2340   KETONESUR NEGATIVE 09/24/2018 2340   PROTEINUR NEGATIVE 09/24/2018 2340   UROBILINOGEN 1.0 07/27/2015 1052   NITRITE NEGATIVE 09/24/2018 2340   LEUKOCYTESUR TRACE (A) 09/24/2018 2340    Sepsis Labs: Lactic Acid, Venous    Component Value Date/Time   LATICACIDVEN 1.7 09/27/2018 1610    MICROBIOLOGY: Recent Results (from the past 240 hour(s))  Blood culture (routine x 2)     Status: None   Collection Time: 09/24/18  7:40 PM  Result Value Ref Range Status   Specimen Description BLOOD BLOOD RIGHT FOREARM  Final   Special Requests   Final    BOTTLES DRAWN AEROBIC AND ANAEROBIC Blood Culture results may not be optimal due to an  inadequate volume of blood received  in culture bottles   Culture   Final    NO GROWTH 5 DAYS Performed at [facility], [address], [place], [place] [address]    Report Status 09/29/2018 FINAL  Final  Blood culture (routine x 2)     Status: None   Collection Time: 09/24/18  7:45 PM  Result Value Ref Range Status   Specimen Description BLOOD LEFT ANTECUBITAL  Final   Special Requests   Final    BOTTLES DRAWN AEROBIC AND ANAEROBIC Blood Culture results may not be optimal due to an inadequate volume of blood received in culture bottles   Culture   Final    NO GROWTH 5 DAYS Performed at [facility], [address], [place], [place] [address]    Report Status 09/29/2018 FINAL  Final  Urine culture     Status: Abnormal   Collection Time: 09/25/18 12:11 AM  Result Value Ref Range Status   Specimen Description URINE, RANDOM  Final   Special Requests   Final    NONE Performed at [facility], [address], [place], [place] [address]    Culture >=100,000 COLONIES/mL ESCHERICHIA COLI (A)  Final   Report Status 09/27/2018 FINAL  Final   Organism ID, Bacteria ESCHERICHIA COLI (A)  Final      Susceptibility   Escherichia coli - MIC*    AMPICILLIN >=32 RESISTANT Resistant     CEFAZOLIN <=4 SENSITIVE Sensitive     CEFTRIAXONE <=1 SENSITIVE Sensitive     CIPROFLOXACIN >=4 RESISTANT Resistant     GENTAMICIN <=1 SENSITIVE Sensitive     IMIPENEM <=0.25 SENSITIVE Sensitive     NITROFURANTOIN <=16 SENSITIVE Sensitive     TRIMETH/SULFA <=20 SENSITIVE Sensitive     AMPICILLIN/SULBACTAM >=32 RESISTANT Resistant     PIP/TAZO <=4 SENSITIVE Sensitive     Extended ESBL NEGATIVE Sensitive     * >=100,000 COLONIES/mL ESCHERICHIA COLI  Respiratory Panel by PCR     Status: None   Collection Time: 09/25/18 12:46 AM  Result Value Ref Range Status   Adenovirus NOT DETECTED NOT DETECTED Final   Coronavirus 229E NOT DETECTED NOT DETECTED Final   Coronavirus HKU1 NOT DETECTED NOT  DETECTED Final   Coronavirus NL63 NOT DETECTED NOT DETECTED Final   Coronavirus OC43 NOT DETECTED NOT DETECTED Final   Metapneumovirus NOT DETECTED NOT DETECTED Final   Rhinovirus / Enterovirus NOT DETECTED NOT DETECTED Final   Influenza A NOT DETECTED NOT DETECTED Final   Influenza B NOT DETECTED NOT DETECTED Final   Parainfluenza Virus 1 NOT DETECTED NOT DETECTED Final   Parainfluenza Virus 2 NOT DETECTED NOT DETECTED Final   Parainfluenza Virus 3 NOT DETECTED NOT DETECTED Final   Parainfluenza Virus 4 NOT DETECTED NOT DETECTED Final   Respiratory Syncytial Virus NOT DETECTED NOT DETECTED Final   Bordetella pertussis NOT DETECTED NOT DETECTED Final   Chlamydophila pneumoniae NOT DETECTED NOT DETECTED Final   Mycoplasma pneumoniae NOT DETECTED NOT DETECTED Final    Comment: Performed at [facility], [address], [place], [place] [address]  MRSA PCR Screening     Status: None   Collection Time: 09/25/18 12:46 AM  Result Value Ref Range Status   MRSA by PCR NEGATIVE NEGATIVE Final    Comment:        The GeneXpert MRSA Assay (FDA approved for NASAL specimens only), is one component of a comprehensive MRSA colonization surveillance program. It is not intended to diagnose MRSA  infection nor to guide or monitor treatment for MRSA infections. Performed at [facility], [address] [place], [place] [address]     RADIOLOGY STUDIES/RESULTS: Ct Head Wo Contrast  Result Date: 09/26/2018 CLINICAL DATA:  Unexplained altered level of consciousness EXAM: CT HEAD WITHOUT CONTRAST TECHNIQUE: Contiguous axial images were obtained from the base of the skull through the vertex without intravenous contrast. COMPARISON:  07/15/2018 FINDINGS: Brain: No evidence of acute infarction, hemorrhage, hydrocephalus, extra-axial collection or mass lesion/mass effect. Moderate remote infarct in the posterior right frontal lobe. Chronic small vessel ischemia in the cerebral white matter with  remote lacunar infarcts in the right caudate and bilateral thalamus. Small remote bilateral cerebellar infarcts. Age advanced cerebral volume loss. Vascular: Atherosclerotic calcification.  No hyperdense vessel. Skull: Negative Sinuses/Orbits: Negative IMPRESSION: No acute finding. Chronic ischemic injury as described. Electronically Signed   By: Marnee Spring M.D.   On: 09/26/2018 15:27   Mr Brain Wo Contrast  Result Date: 09/29/2018 CLINICAL DATA:  Generalized weakness. Altered mental status. History of stroke. Possible metabolic encephalopathy. EXAM: MRI HEAD WITHOUT CONTRAST TECHNIQUE: Multiplanar, multiecho pulse sequences of the brain and surrounding structures were obtained without intravenous contrast. COMPARISON:  MR brain 05/10/2018. CT head 09/26/2018. FINDINGS: The patient was unable to remain motionless for the exam. There is significant motion degradation. Small or subtle lesions could be overlooked. The study is of relatively poor diagnostic quality. Brain: No evidence for acute infarction, hemorrhage, mass lesion, hydrocephalus, or extra-axial fluid. Generalized atrophy. T2 and FLAIR hyperintensities in the white matter, consistent with small vessel disease. Remote BILATERAL cerebellar infarcts. Large remote RIGHT posterior frontal infarct. Vascular: Flow voids are maintained in the BILATERAL carotid arteries, basilar artery, and RIGHT vertebral artery. The LEFT vertebral is small, and appears to terminate in PICA. The basilar is hypoplastic due to BILATERAL fetal PCA anatomy. Skull and upper cervical spine: Normal marrow signal. No upper cervical lesions. Sinuses/Orbits: No significant sinus opacity. No gross orbital findings. Other: None. IMPRESSION: Significantly motion degraded scan. No acute stroke or intracranial mass lesion. Atrophy and small vessel disease with widespread areas of chronic infarction. Similar appearance to priors. Electronically Signed   By: Elsie Stain M.D.   On:  09/29/2018 17:09   Dg Chest Port 1 View  Result Date: 09/25/2018 CLINICAL DATA:  Respiratory failure. EXAM: PORTABLE CHEST 1 VIEW COMPARISON:  Radiograph of September 24, 2018. FINDINGS: Stable cardiomediastinal silhouette. No pneumothorax is noted. Old right rib fractures are noted. Mild bibasilar subsegmental atelectasis or infiltrates is noted. Status post surgical posterior fusion of thoracic spine. IMPRESSION: Mild bibasilar subsegmental atelectasis or infiltrates. Electronically Signed   By: Lupita Raider, M.D.   On: 09/25/2018 07:47   Dg Chest Portable 1 View  Result Date: 09/24/2018 CLINICAL DATA:  Lethargy. Recent hospitalization for pneumonia and sepsis. EXAM: PORTABLE CHEST 1 VIEW COMPARISON:  Chest CT 09/09/2018 and radiograph 09/06/2018 FINDINGS: The cardiac silhouette is upper limits of normal in size. There is mild chronic interstitial coarsening. The lungs are mildly hypoinflated with mild bilateral infrahilar opacity. No sizable pleural effusion or pneumothorax is identified. A well-defined lucent lesion in the proximal right humerus is chronic. There are degenerative changes at both shoulders. Right upper quadrant abdominal surgical clips and prior thoracic fusion are noted. IMPRESSION: Mild bilateral infrahilar opacities which may reflect atelectasis or pneumonia. Electronically Signed   By: Sebastian Ache M.D.   On: 09/24/2018 20:42     LOS: 6 days   Signature  Prashant  Thedore Mins M.D on 09/30/2018 at 10:47 AM   -  To page go to [email or web address] - password [facility]

## 2018-10-01 NOTE — Progress Notes (Signed)
  Speech Language Pathology Treatment: Dysphagia  Patient Details Name: Amber [name] MRN: [id number] DOB: November 03, 1956 Today's Date: 10/01/2018 Time: [id number] SLP Time Calculation (min) (ACUTE ONLY): 16 min  Assessment / Plan / Recommendation Clinical Impression  Pt's diet has been advanced to regular textures, still with thin liquids. Her breakfast tray was primarily comprised of soft solids and purees. SLP provided set-up assistance, chopping foods at pt's request, with mastication still prolonged. Min cues were given for pt to take smaller bites or use a liquid wash. She says that she takes a while to chew at home when she does not have her dentures in place, and she knows to take her time. No overt signs of aspiration were observed with items from meal tray or with pills administered whole with thin liquid by RN. Pt prefers diet adjustment to Dys 3 textures, continuing thin liquids. Note that pt may d/c home soon - she should be appropriate to resume her baseline diet upon d/c.   HPI HPI: 62 y.o. female admitted 11/5 with hypotension and AMS now being treated for PNA and Sepsis. PMH includes: CVA, HTN, Anemia, GERD, Seizures, , ACS, Frequent Falls, Back surgery.  Pt had prior BSE in January 2018 recommending regular diet and thin liquids but MBS was recommended given h/o esophageal issues, hoarse vocal quality, and recurrent PNA. This was scheduled to be done on an OP basis as pt was discharging, but it appears as though she did not keep the appointment. MBS was performed in October 2019 at [facility] with report suggesting an oral dysphagia although with flash penetration with thin liquids. Dys 2 diet and thin liquids were recommended.      SLP Plan  Continue with current plan of care       Recommendations  Diet recommendations: Dysphagia 3 (mechanical soft);Thin liquid Liquids provided via: Cup;Straw Medication Administration: Whole meds with liquid Supervision: Staff to assist with self  feeding Compensations: Slow rate;Small sips/bites Postural Changes and/or Swallow Maneuvers: Seated upright 90 degrees                Oral Care Recommendations: Oral care BID Follow up Recommendations: 24 hour supervision/assistance SLP Visit Diagnosis: Dysphagia, unspecified (R13.10) Plan: Continue with current plan of care       GO                Maxcine Ham 10/01/2018, 10:16 AM  Maxcine Ham, M.A. CCC-SLP Acute Herbalist [phone] Office [phone]

## 2018-10-01 NOTE — Progress Notes (Addendum)
    Durable Medical Equipment  (From admission, onward)         Start     Ordered   10/01/18 0740  For home use only DME standard manual wheelchair with seat cushion  Once    Comments:  Patient suffers from cva, weakness which impairs their ability to perform daily activities like walking in the home.  A rolling walker will not resolve  issue with performing activities of daily living. A wheelchair will allow patient to safely perform daily activities. Patient can safely propel the wheelchair in the home or has a caregiver who can provide assistance.  Accessories: elevating leg rests (ELRs), wheel locks, extensions and anti-tippers.   10/01/18 0740   10/01/18 0738  For home use only DME Other see comment  Once    Comments:  Standard wheelchair pad   10/01/18 0738   10/01/18 0737  For home use only DME Hospital bed  Once    Comments:  [facility] lift and pad needed  Question Answer Comment  Patient has (list medical condition): CVA, HTN, Anemia, GERD, Seizures, , ACS, Frequent Falls, Back surgery   The above medical condition requires: Patient requires the ability to reposition frequently   Head must be elevated greater than: 30 degrees   Bed type Semi-electric   Hoyer Lift Yes      10/01/18 0737   10/01/18 0718  For home use only DME Other see comment  Once    Comments:  Hospital bed;Wheelchair (measurements PT);Wheelchair cushion (measurements PT);Other (comment)(hoyer lift, hoyer lift pad )   10/01/18 7681

## 2018-10-01 NOTE — Progress Notes (Signed)
APS called and asked for an update on the patient. CSW alerted her that the patient had discharged from the hospital.   Cristobal Goldmann LCSW [phone]

## 2018-10-01 NOTE — Discharge Instructions (Signed)
Follow with Primary MD [name], MD in 7 days   Get CBC, CMP, 2 view Chest X ray -  checked  by Primary MD in 5-7 days    Activity: As tolerated with Full fall precautions use walker/cane & assistance as needed  Disposition Home    Diet: Heart Healthy    For Heart failure patients - Check your Weight same time everyday, if you gain over 2 pounds, or you develop in leg swelling, experience more shortness of breath or chest pain, call your Primary MD immediately. Follow Cardiac Low Salt Diet and 1.5 lit/day fluid restriction.  Special Instructions: If you have smoked or chewed Tobacco  in the last 2 yrs please stop smoking, stop any regular Alcohol  and or any Recreational drug use.  On your next visit with your primary care physician please Get Medicines reviewed and adjusted.  Please request your Prim.MD to go over all Hospital Tests and Procedure/Radiological results at the follow up, please get all Hospital records sent to your Prim MD by signing hospital release before you go home.  If you experience worsening of your admission symptoms, develop shortness of breath, life threatening emergency, suicidal or homicidal thoughts you must seek medical attention immediately by calling 911 or calling your MD immediately  if symptoms less severe.

## 2018-10-01 NOTE — Discharge Summary (Signed)
Amber [name] LYY:[id number] DOB: 26-Jun-1956 DOA: 09/24/2018  PCP: [name], MD  Admit date: 09/24/2018  Discharge date: 10/01/2018  Admitted From: Home   Disposition:  Home - refused SNF   Recommendations for Outpatient Follow-up:   Follow up with PCP in 1-2 weeks  PCP Please obtain BMP/CBC, 2 view CXR in 1week,  (see Discharge instructions)   PCP Please follow up on the following pending results:    Home Health: PT, RN, Aide   Equipment/Devices: Hosp.bed, wheelchair, lift  Consultations: None Discharge Condition: Guarded   CODE STATUS: Full   Diet Recommendation: Heart Healthy     Chief Complaint  Patient presents with  . Code Sepsis     Brief history of present illness from the day of admission and additional interim summary    Patient is a 62 y.o. female with history of PAF (not on anticoagulation due to recent GI bleeding in October 2019), seizures, hypertension, COPD, failure to thrive syndrome admitted by CCM on 11/5 with severe sepsis secondary to E. coli UTI and associated metabolic encephalopathy.  Upon stability transfer to the trial hospitalist service on 11/9.                                                                  Hospital Course   Severe sepsis secondary to E. coli UTI: Sepsis pathophysiology has resolved-no longer on IV antibiotics-has been transition to Keflex.  Blood cultures on 11/5- so far, urine culture on 11/6+ for E. coli.  Finished antibiotic course today and now no signs of infection.   Acute metabolic encephalopathy: Likely due to above along with hospital-acquired delirium, she had a prolonged hospitalization at [facility] few days ago and was encephalopathic there as well, discussed with daughter on 09/29/2018.  She had a unremarkable EEG along with head CT and  MRI which were all nonacute this admission.  Her delirium is off and on and gradually improving, continue to avoid benzodiazepines and narcotics, if needed use PRN Haldol for agitation.  Overall delirium has improved she is close to baseline, my main concern is that she is extremely frail and deconditioned, we highly recommended SNF but patient and family have refused and taking her back home with home health.  My suspicion is she will be back soon.  Seizure disorder: Seizure-free since admission-EEG negative for seizures.  Continue Depakote.  PAF [place] vas 2 score of at least 3: Sinus rhythm with some runs of A. fib overnight.  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  Anemia: Suspect secondary to critical illness-significant drop of hemoglobin on 11/5  to 5.9 requiring PRBC transfusion.  Hemoglobin currently stable at 11.8.  No evidence of GI bleeding/overt blood loss.  Follow.  Thrombocytopenia: Suspect this is secondary to sepsis-no evidence of bleeding-appears mild at present-follow.  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  Continue PPI.  No longer on anticoagulation since discharge from [facility] hospital.  Hypokalemia/hypomagnesemia: Both replaced and stable.  ?  Tardive dyskinesia: Does have some lipsmacking/facial movement at times-appears to be a chronic issue-EEG negative.   History of COPD: Lungs are clear-continue with bronchodilators.  She appears to be on chronic steroids which have been continued.  History of CVA: No longer on apixaban-given recent GI bleeding.  Has generalized weakness but appears to be nonfocal.  Failure to thrive  syndrome: Appears very frail and elderly-SNF recommended but patient and family refused.   Discharge diagnosis     Active Problems:   HCAP (healthcare-associated pneumonia)    Discharge instructions    Discharge Instructions    Diet - low sodium heart healthy   Complete by:  As directed    Discharge instructions   Complete by:  As directed    Follow with Primary MD [name], MD in 7 days   Get CBC, CMP, 2 view Chest X ray -  checked  by Primary MD in 5-7 days    Activity: As tolerated with Full fall precautions use walker/cane & assistance as needed  Disposition Home    Diet: Heart Healthy    For Heart failure patients - Check your Weight same time everyday, if you gain over 2 pounds, or you develop in leg swelling, experience more shortness of breath or chest pain, call your Primary MD immediately. Follow Cardiac Low Salt Diet and 1.5 lit/day fluid restriction.  Special Instructions: If you have smoked or chewed Tobacco  in the last 2 yrs please stop smoking, stop any regular Alcohol  and or any Recreational drug use.  On your next visit with your primary care physician please Get Medicines reviewed and adjusted.  Please request your Prim.MD to go over all Hospital Tests and Procedure/Radiological results at the follow up, please get all Hospital records sent to your Prim MD by signing hospital release before you go home.  If you experience worsening of your admission symptoms, develop shortness of breath, life threatening emergency, suicidal or homicidal thoughts you must seek medical attention immediately by calling 911 or calling your MD immediately  if symptoms less severe.   Increase activity slowly   Complete by:  As directed       Discharge Medications   Allergies as of 10/01/2018      Reactions   Ivp Dye [iodinated Diagnostic Agents] Itching   Metrizamide Itching      Medication List    STOP taking these medications   gabapentin 300 MG capsule Commonly  known as:  NEURONTIN   senna-docusate 8.6-50 MG tablet Commonly known as:  Senokot-S   tiZANidine 4 MG tablet Commonly known as:  ZANAFLEX     TAKE these medications   allopurinol 300 MG tablet Commonly known as:  ZYLOPRIM Take 300 mg by mouth daily.   atorvastatin 10 MG tablet Commonly known as:  LIPITOR Take 10 mg by mouth daily at 6 PM.   feeding supplement (ENSURE ENLIVE) Liqd Take 237 mLs by mouth 2 (two) times daily between meals.   ferrous sulfate 325 (65 FE) MG tablet Take 1 tablet (325 mg total) by  mouth daily with breakfast.   levETIRAcetam 500 MG tablet Commonly known as:  KEPPRA Take 1 tablet (500 mg total) by mouth 2 (two) times daily.   metoprolol succinate 25 MG 24 hr tablet Commonly known as:  TOPROL-XL Take 25 mg by mouth daily.   Oxycodone HCl 20 MG Tabs Take 1 tablet (20 mg total) by mouth 4 (four) times daily. What changed:  Another medication with the same name was removed. Continue taking this medication, and follow the directions you see here.   pantoprazole 40 MG tablet Commonly known as:  PROTONIX Take 40 mg by mouth 2 (two) times daily.   predniSONE 5 MG tablet Commonly known as:  DELTASONE Take 5 mg by mouth 3 (three) times daily.   valproic acid 250 MG capsule Commonly known as:  DEPAKENE Take 500 mg by mouth 3 (three) times daily.   Vitamin D (Ergocalciferol) 1.25 MG (50000 UT) Caps capsule Commonly known as:  DRISDOL Take 1 capsule (50,000 Units total) by mouth every 7 (seven) days. Sunday            Durable Medical Equipment  (From admission, onward)         Start     Ordered   10/01/18 0740  For home use only DME standard manual wheelchair with seat cushion  Once    Comments:  Patient suffers from cva, weakness which impairs their ability to perform daily activities like walking in the home.  A rolling walker will not resolve  issue with performing activities of daily living. A wheelchair will allow patient to safely  perform daily activities. Patient can safely propel the wheelchair in the home or has a caregiver who can provide assistance.  Accessories: elevating leg rests (ELRs), wheel locks, extensions and anti-tippers.   10/01/18 0740   10/01/18 0738  For home use only DME Other see comment  Once    Comments:  Standard wheelchair pad   10/01/18 0738   10/01/18 0737  For home use only DME Hospital bed  Once    Comments:  Hoyer lift and pad needed  Question Answer Comment  Patient has (list medical condition): CVA, HTN, Anemia, GERD, Seizures, , ACS, Frequent Falls, Back surgery   The above medical condition requires: Patient requires the ability to reposition frequently   Head must be elevated greater than: 30 degrees   Bed type Semi-electric   Hoyer Lift Yes      11 /12/19 0737   10/01/18 0718  For home use only DME Other see comment  Once    Comments:  Hospital bed;Wheelchair (measurements PT);Wheelchair cushion (measurements PT);Other (comment)(hoyer lift, hoyer lift pad )   10/01/18 0717          Follow-up Information    [facility]-Home Follow up.   Specialty:  Home Health Services Contact information: [address] [place] [address] [phone]        [facility] - Dme Follow up.   Why:  hospital bed , hoyer lift and pad, wheelchair arranged Contact information: [address] [place] [address] [phone]        [name], MD. Schedule an appointment as soon as possible for a visit in 1 week(s).   Specialty:  Internal Medicine Contact information: [address] [place] [address] [phone]           Major procedures and Radiology Reports - PLEASE review detailed and final reports thoroughly  -  Ct Head Wo Contrast  Result Date: 09/26/2018 CLINICAL DATA:  Unexplained altered level of consciousness EXAM: CT HEAD WITHOUT CONTRAST TECHNIQUE: Contiguous axial images were obtained from the base of the  skull through the vertex without intravenous contrast. COMPARISON:  07/15/2018 FINDINGS: Brain: No evidence of acute infarction, hemorrhage, hydrocephalus, extra-axial collection or mass lesion/mass effect. Moderate remote infarct in the posterior right frontal lobe. Chronic small vessel ischemia in the cerebral white matter with remote lacunar infarcts in the right caudate and bilateral thalamus. Small remote bilateral cerebellar infarcts. Age advanced cerebral volume loss. Vascular: Atherosclerotic calcification.  No hyperdense vessel. Skull: Negative Sinuses/Orbits: Negative IMPRESSION: No acute finding. Chronic ischemic injury as described. Electronically Signed   By: Marnee Spring M.D.   On: 09/26/2018 15:27   Mr Brain Wo Contrast  Result Date: 09/29/2018 CLINICAL DATA:  Generalized weakness. Altered mental status. History of stroke. Possible metabolic encephalopathy. EXAM: MRI HEAD WITHOUT CONTRAST TECHNIQUE: Multiplanar, multiecho pulse sequences of the brain and surrounding structures were obtained without intravenous contrast. COMPARISON:  MR brain 05/10/2018. CT head 09/26/2018. FINDINGS: The patient was unable to remain motionless for the exam. There is significant motion degradation. Small or subtle lesions could be overlooked. The study is of relatively poor diagnostic quality. Brain: No evidence for acute infarction, hemorrhage, mass lesion, hydrocephalus, or extra-axial fluid. Generalized atrophy. T2 and FLAIR hyperintensities in the white matter, consistent with small vessel disease. Remote BILATERAL cerebellar infarcts. Large remote RIGHT posterior frontal infarct. Vascular: Flow voids are maintained in the BILATERAL carotid arteries, basilar artery, and RIGHT vertebral artery. The LEFT vertebral is small, and appears to terminate in PICA. The basilar is hypoplastic due to BILATERAL fetal PCA anatomy. Skull and upper cervical spine: Normal marrow signal. No upper cervical lesions.  Sinuses/Orbits: No significant sinus opacity. No gross orbital findings. Other: None. IMPRESSION: Significantly motion degraded scan. No acute stroke or intracranial mass lesion. Atrophy and small vessel disease with widespread areas of chronic infarction. Similar appearance to priors. Electronically Signed   By: Elsie Stain M.D.   On: 09/29/2018 17:09   Dg Chest Port 1 View  Result Date: 09/25/2018 CLINICAL DATA:  Respiratory failure. EXAM: PORTABLE CHEST 1 VIEW COMPARISON:  Radiograph of September 24, 2018. FINDINGS: Stable cardiomediastinal silhouette. No pneumothorax is noted. Old right rib fractures are noted. Mild bibasilar subsegmental atelectasis or infiltrates is noted. Status post surgical posterior fusion of thoracic spine. IMPRESSION: Mild bibasilar subsegmental atelectasis or infiltrates. Electronically Signed   By: Lupita Raider, M.D.   On: 09/25/2018 07:47   Dg Chest Portable 1 View  Result Date: 09/24/2018 CLINICAL DATA:  Lethargy. Recent hospitalization for pneumonia and sepsis. EXAM: PORTABLE CHEST 1 VIEW COMPARISON:  Chest CT 09/09/2018 and radiograph 09/06/2018 FINDINGS: The cardiac silhouette is upper limits of normal in size. There is mild chronic interstitial coarsening. The lungs are mildly hypoinflated with mild bilateral infrahilar opacity. No sizable pleural effusion or pneumothorax is identified. A well-defined lucent lesion in the proximal right humerus is chronic. There are degenerative changes at both shoulders. Right upper quadrant abdominal surgical clips and prior thoracic fusion are noted. IMPRESSION: Mild bilateral infrahilar opacities which may reflect atelectasis or pneumonia. Electronically Signed   By: Sebastian Ache M.D.   On: 09/24/2018 20:42    Micro Results    Recent Results (from the past 240 hour(s))  Blood culture (routine x 2)     Status: None   Collection Time: 09/24/18  7:40 PM  Result Value Ref Range Status  Specimen Description BLOOD BLOOD RIGHT  FOREARM  Final   Special Requests   Final    BOTTLES DRAWN AEROBIC AND ANAEROBIC Blood Culture results may not be optimal due to an inadequate volume of blood received in culture bottles   Culture   Final    NO GROWTH 5 DAYS Performed at [facility], [address], [place], [place] [address]    Report Status 09/29/2018 FINAL  Final  Blood culture (routine x 2)     Status: None   Collection Time: 09/24/18  7:45 PM  Result Value Ref Range Status   Specimen Description BLOOD LEFT ANTECUBITAL  Final   Special Requests   Final    BOTTLES DRAWN AEROBIC AND ANAEROBIC Blood Culture results may not be optimal due to an inadequate volume of blood received in culture bottles   Culture   Final    NO GROWTH 5 DAYS Performed at [facility], [address], [place], [place] [address]    Report Status 09/29/2018 FINAL  Final  Urine culture     Status: Abnormal   Collection Time: 09/25/18 12:11 AM  Result Value Ref Range Status   Specimen Description URINE, RANDOM  Final   Special Requests   Final    NONE Performed at [facility], [address] [place], [place] [address]    Culture >=100,000 COLONIES/mL ESCHERICHIA COLI (A)  Final   Report Status 09/27/2018 FINAL  Final   Organism ID, Bacteria ESCHERICHIA COLI (A)  Final      Susceptibility   Escherichia coli - MIC*    AMPICILLIN >=32 RESISTANT Resistant     CEFAZOLIN <=4 SENSITIVE Sensitive     CEFTRIAXONE <=1 SENSITIVE Sensitive     CIPROFLOXACIN >=4 RESISTANT Resistant     GENTAMICIN <=1 SENSITIVE Sensitive     IMIPENEM <=0.25 SENSITIVE Sensitive     NITROFURANTOIN <=16 SENSITIVE Sensitive     TRIMETH/SULFA <=20 SENSITIVE Sensitive     AMPICILLIN/SULBACTAM >=32 RESISTANT Resistant     PIP/TAZO <=4 SENSITIVE Sensitive     Extended ESBL NEGATIVE Sensitive     * >=100,000 COLONIES/mL ESCHERICHIA COLI  Respiratory Panel by PCR     Status: None   Collection Time: 09/25/18 12:46 AM  Result Value Ref Range Status    Adenovirus NOT DETECTED NOT DETECTED Final   Coronavirus 229E NOT DETECTED NOT DETECTED Final   Coronavirus HKU1 NOT DETECTED NOT DETECTED Final   Coronavirus NL63 NOT DETECTED NOT DETECTED Final   Coronavirus OC43 NOT DETECTED NOT DETECTED Final   Metapneumovirus NOT DETECTED NOT DETECTED Final   Rhinovirus / Enterovirus NOT DETECTED NOT DETECTED Final   Influenza A NOT DETECTED NOT DETECTED Final   Influenza B NOT DETECTED NOT DETECTED Final   Parainfluenza Virus 1 NOT DETECTED NOT DETECTED Final   Parainfluenza Virus 2 NOT DETECTED NOT DETECTED Final   Parainfluenza Virus 3 NOT DETECTED NOT DETECTED Final   Parainfluenza Virus 4 NOT DETECTED NOT DETECTED Final   Respiratory Syncytial Virus NOT DETECTED NOT DETECTED Final   Bordetella pertussis NOT DETECTED NOT DETECTED Final   Chlamydophila pneumoniae NOT DETECTED NOT DETECTED Final   Mycoplasma pneumoniae NOT DETECTED NOT DETECTED Final    Comment: Performed at [facility], [address] [place], [place] [address]  MRSA PCR Screening     Status: None   Collection Time: 09/25/18 12:46 AM  Result Value Ref Range Status   MRSA by PCR NEGATIVE NEGATIVE  Final    Comment:        The GeneXpert MRSA Assay (FDA approved for NASAL specimens only), is one component of a comprehensive MRSA colonization surveillance program. It is not intended to diagnose MRSA infection nor to guide or monitor treatment for MRSA infections. Performed at [facility], [address] [place], [place] [address]     Today   Subjective    Amber [name] today has no headache,no chest abdominal pain,no new weakness tingling or numbness, feels much better wants to go home today again refused SNF.    Objective   Blood pressure (!) 150/100, pulse 85, temperature 98.1 F (36.7 C), resp. rate 16, height 5' (1.524 m), weight 46.2 kg, SpO2 98 %.   Intake/Output Summary (Last 24 hours) at 10/01/2018 0823 Last data filed at 10/01/2018  0641 Gross per 24 hour  Intake 660 ml  Output 1600 ml  Net -940 ml    Exam Awake Alert, Oriented x 2, No new F.N deficits, Normal affect [place].AT,PERRAL Supple Neck,No JVD, No cervical lymphadenopathy appriciated.  Symmetrical Chest wall movement, Good air movement bilaterally, CTAB RRR,No Gallops,Rubs or new Murmurs, No Parasternal Heave +ve B.Sounds, Abd Soft, Non tender, No organomegaly appriciated, No rebound -guarding or rigidity. No Cyanosis, Clubbing or edema, No new Rash or bruise   Data Review   CBC w Diff:  Lab Results  Component Value Date   WBC 8.1 09/29/2018   HGB 12.9 09/29/2018   HCT 38.2 09/29/2018   PLT 41 (L) 09/29/2018   LYMPHOPCT 33 09/28/2018   MONOPCT 3 09/28/2018   EOSPCT 0 09/28/2018   BASOPCT 0 09/28/2018    CMP:  Lab Results  Component Value Date   NA 143 09/30/2018   K 4.0 09/30/2018   CL 122 (H) 09/30/2018   CO2 17 (L) 09/30/2018   BUN 11 09/30/2018   CREATININE 0.82 09/30/2018   PROT 5.5 (L) 09/28/2018   ALBUMIN 1.4 (L) 09/28/2018   BILITOT 0.7 09/28/2018   ALKPHOS 113 09/28/2018   AST 42 (H) 09/28/2018   ALT 18 09/28/2018  .   Total Time in preparing paper work, data evaluation and todays exam - 35 minutes  Susa Raring M.D on 10/01/2018 at 8:23 AM  [facility]   Office  [phone]

## 2018-10-10 ENCOUNTER — Emergency Department (HOSPITAL_BASED_OUTPATIENT_CLINIC_OR_DEPARTMENT_OTHER): Payer: Medicaid Other

## 2018-10-10 ENCOUNTER — Other Ambulatory Visit: Payer: Self-pay

## 2018-10-10 ENCOUNTER — Inpatient Hospital Stay (HOSPITAL_BASED_OUTPATIENT_CLINIC_OR_DEPARTMENT_OTHER)
Admission: EM | Admit: 2018-10-10 | Discharge: 2018-10-14 | DRG: 871 | Disposition: A | Discharge status: Skilled Nursing Facility | Payer: Medicaid Other | Attending: Internal Medicine | Admitting: Internal Medicine

## 2018-10-10 ENCOUNTER — Encounter (HOSPITAL_BASED_OUTPATIENT_CLINIC_OR_DEPARTMENT_OTHER): Payer: Self-pay | Admitting: *Deleted

## 2018-10-10 DIAGNOSIS — M109 Gout, unspecified: Secondary | ICD-10-CM | POA: Diagnosis present

## 2018-10-10 DIAGNOSIS — K7682 Hepatic encephalopathy: Secondary | ICD-10-CM | POA: Diagnosis present

## 2018-10-10 DIAGNOSIS — D649 Anemia, unspecified: Secondary | ICD-10-CM | POA: Diagnosis not present

## 2018-10-10 DIAGNOSIS — I480 Paroxysmal atrial fibrillation: Secondary | ICD-10-CM | POA: Diagnosis present

## 2018-10-10 DIAGNOSIS — A419 Sepsis, unspecified organism: Principal | ICD-10-CM | POA: Diagnosis present

## 2018-10-10 DIAGNOSIS — J449 Chronic obstructive pulmonary disease, unspecified: Secondary | ICD-10-CM | POA: Diagnosis present

## 2018-10-10 DIAGNOSIS — E46 Unspecified protein-calorie malnutrition: Secondary | ICD-10-CM | POA: Diagnosis present

## 2018-10-10 DIAGNOSIS — I110 Hypertensive heart disease with heart failure: Secondary | ICD-10-CM | POA: Diagnosis present

## 2018-10-10 DIAGNOSIS — E1151 Type 2 diabetes mellitus with diabetic peripheral angiopathy without gangrene: Secondary | ICD-10-CM | POA: Diagnosis not present

## 2018-10-10 DIAGNOSIS — R0602 Shortness of breath: Secondary | ICD-10-CM

## 2018-10-10 DIAGNOSIS — G9341 Metabolic encephalopathy: Secondary | ICD-10-CM | POA: Diagnosis present

## 2018-10-10 DIAGNOSIS — M069 Rheumatoid arthritis, unspecified: Secondary | ICD-10-CM

## 2018-10-10 DIAGNOSIS — Z7952 Long term (current) use of systemic steroids: Secondary | ICD-10-CM

## 2018-10-10 DIAGNOSIS — I5033 Acute on chronic diastolic (congestive) heart failure: Secondary | ICD-10-CM | POA: Diagnosis not present

## 2018-10-10 DIAGNOSIS — M1990 Unspecified osteoarthritis, unspecified site: Secondary | ICD-10-CM | POA: Diagnosis not present

## 2018-10-10 DIAGNOSIS — L89312 Pressure ulcer of right buttock, stage 2: Secondary | ICD-10-CM | POA: Diagnosis present

## 2018-10-10 DIAGNOSIS — I6930 Unspecified sequelae of cerebral infarction: Secondary | ICD-10-CM | POA: Diagnosis not present

## 2018-10-10 DIAGNOSIS — K219 Gastro-esophageal reflux disease without esophagitis: Secondary | ICD-10-CM | POA: Diagnosis present

## 2018-10-10 DIAGNOSIS — L89152 Pressure ulcer of sacral region, stage 2: Secondary | ICD-10-CM | POA: Diagnosis present

## 2018-10-10 DIAGNOSIS — M5440 Lumbago with sciatica, unspecified side: Secondary | ICD-10-CM | POA: Diagnosis not present

## 2018-10-10 DIAGNOSIS — R64 Cachexia: Secondary | ICD-10-CM | POA: Diagnosis present

## 2018-10-10 DIAGNOSIS — R112 Nausea with vomiting, unspecified: Secondary | ICD-10-CM | POA: Diagnosis present

## 2018-10-10 DIAGNOSIS — G8929 Other chronic pain: Secondary | ICD-10-CM | POA: Diagnosis present

## 2018-10-10 DIAGNOSIS — L8990 Pressure ulcer of unspecified site, unspecified stage: Secondary | ICD-10-CM

## 2018-10-10 DIAGNOSIS — I472 Ventricular tachycardia: Secondary | ICD-10-CM | POA: Diagnosis not present

## 2018-10-10 DIAGNOSIS — M549 Dorsalgia, unspecified: Secondary | ICD-10-CM | POA: Diagnosis present

## 2018-10-10 DIAGNOSIS — Z9049 Acquired absence of other specified parts of digestive tract: Secondary | ICD-10-CM

## 2018-10-10 DIAGNOSIS — Z87891 Personal history of nicotine dependence: Secondary | ICD-10-CM | POA: Diagnosis not present

## 2018-10-10 DIAGNOSIS — G40909 Epilepsy, unspecified, not intractable, without status epilepticus: Secondary | ICD-10-CM | POA: Diagnosis present

## 2018-10-10 DIAGNOSIS — Z87898 Personal history of other specified conditions: Secondary | ICD-10-CM | POA: Diagnosis not present

## 2018-10-10 DIAGNOSIS — Z681 Body mass index (BMI) 19 or less, adult: Secondary | ICD-10-CM | POA: Diagnosis not present

## 2018-10-10 DIAGNOSIS — R601 Generalized edema: Secondary | ICD-10-CM | POA: Diagnosis present

## 2018-10-10 DIAGNOSIS — R54 Age-related physical debility: Secondary | ICD-10-CM | POA: Diagnosis present

## 2018-10-10 DIAGNOSIS — R52 Pain, unspecified: Secondary | ICD-10-CM

## 2018-10-10 DIAGNOSIS — D696 Thrombocytopenia, unspecified: Secondary | ICD-10-CM | POA: Diagnosis present

## 2018-10-10 DIAGNOSIS — R627 Adult failure to thrive: Secondary | ICD-10-CM | POA: Diagnosis not present

## 2018-10-10 DIAGNOSIS — E119 Type 2 diabetes mellitus without complications: Secondary | ICD-10-CM

## 2018-10-10 DIAGNOSIS — Z888 Allergy status to other drugs, medicaments and biological substances status: Secondary | ICD-10-CM

## 2018-10-10 DIAGNOSIS — B370 Candidal stomatitis: Secondary | ICD-10-CM | POA: Diagnosis not present

## 2018-10-10 DIAGNOSIS — I272 Pulmonary hypertension, unspecified: Secondary | ICD-10-CM | POA: Diagnosis present

## 2018-10-10 DIAGNOSIS — E1121 Type 2 diabetes mellitus with diabetic nephropathy: Secondary | ICD-10-CM | POA: Diagnosis not present

## 2018-10-10 DIAGNOSIS — J690 Pneumonitis due to inhalation of food and vomit: Secondary | ICD-10-CM | POA: Diagnosis present

## 2018-10-10 DIAGNOSIS — E876 Hypokalemia: Secondary | ICD-10-CM | POA: Diagnosis not present

## 2018-10-10 DIAGNOSIS — J189 Pneumonia, unspecified organism: Secondary | ICD-10-CM

## 2018-10-10 DIAGNOSIS — Z841 Family history of disorders of kidney and ureter: Secondary | ICD-10-CM

## 2018-10-10 DIAGNOSIS — L89892 Pressure ulcer of other site, stage 2: Secondary | ICD-10-CM | POA: Diagnosis present

## 2018-10-10 DIAGNOSIS — L89322 Pressure ulcer of left buttock, stage 2: Secondary | ICD-10-CM | POA: Diagnosis present

## 2018-10-10 DIAGNOSIS — Z8249 Family history of ischemic heart disease and other diseases of the circulatory system: Secondary | ICD-10-CM

## 2018-10-10 DIAGNOSIS — K7200 Acute and subacute hepatic failure without coma: Secondary | ICD-10-CM | POA: Diagnosis present

## 2018-10-10 DIAGNOSIS — Z91041 Radiographic dye allergy status: Secondary | ICD-10-CM

## 2018-10-10 LAB — URINALYSIS, ROUTINE W REFLEX MICROSCOPIC
Bilirubin Urine: NEGATIVE
GLUCOSE, UA: NEGATIVE mg/dL
Hgb urine dipstick: NEGATIVE
Ketones, ur: NEGATIVE mg/dL
LEUKOCYTES UA: NEGATIVE
NITRITE: NEGATIVE
PH: 6.5 (ref 5.0–8.0)
Protein, ur: NEGATIVE mg/dL
Specific Gravity, Urine: 1.015 (ref 1.005–1.030)

## 2018-10-10 LAB — CBC WITH DIFFERENTIAL/PLATELET
Abs Immature Granulocytes: 0.15 10*3/uL — ABNORMAL HIGH (ref 0.00–0.07)
Basophils Absolute: 0 10*3/uL (ref 0.0–0.1)
Basophils Relative: 0 %
EOS PCT: 0 %
Eosinophils Absolute: 0 10*3/uL (ref 0.0–0.5)
HCT: 26.7 % — ABNORMAL LOW (ref 36.0–46.0)
HEMOGLOBIN: 8.1 g/dL — AB (ref 12.0–15.0)
Immature Granulocytes: 1 %
LYMPHS PCT: 14 %
Lymphs Abs: 2.3 10*3/uL (ref 0.7–4.0)
MCH: 27.2 pg (ref 26.0–34.0)
MCHC: 30.3 g/dL (ref 30.0–36.0)
MCV: 89.6 fL (ref 80.0–100.0)
Monocytes Absolute: 1.6 10*3/uL — ABNORMAL HIGH (ref 0.1–1.0)
Monocytes Relative: 10 %
NEUTROS ABS: 12.3 10*3/uL — AB (ref 1.7–7.7)
Neutrophils Relative %: 75 %
Platelets: 106 10*3/uL — ABNORMAL LOW (ref 150–400)
RBC: 2.98 MIL/uL — ABNORMAL LOW (ref 3.87–5.11)
RDW: 21.2 % — ABNORMAL HIGH (ref 11.5–15.5)
WBC: 16.4 10*3/uL — ABNORMAL HIGH (ref 4.0–10.5)
nRBC: 0 % (ref 0.0–0.2)

## 2018-10-10 LAB — COMPREHENSIVE METABOLIC PANEL
ALBUMIN: 1.7 g/dL — AB (ref 3.5–5.0)
ALT: 18 U/L (ref 0–44)
AST: 23 U/L (ref 15–41)
Alkaline Phosphatase: 100 U/L (ref 38–126)
Anion gap: 7 (ref 5–15)
BUN: 16 mg/dL (ref 6–20)
CHLORIDE: 113 mmol/L — AB (ref 98–111)
CO2: 22 mmol/L (ref 22–32)
CREATININE: 0.87 mg/dL (ref 0.44–1.00)
Calcium: 7.7 mg/dL — ABNORMAL LOW (ref 8.9–10.3)
GFR calc Af Amer: >60 mL/min (ref >60)
GFR calc non Af Amer: >60 mL/min (ref >60)
Glucose, Bld: 193 mg/dL — ABNORMAL HIGH (ref 70–99)
Potassium: 4 mmol/L (ref 3.5–5.1)
Sodium: 142 mmol/L (ref 135–145)
Total Bilirubin: 0.5 mg/dL (ref 0.3–1.2)
Total Protein: 5.2 g/dL — ABNORMAL LOW (ref 6.5–8.1)

## 2018-10-10 LAB — I-STAT VENOUS BLOOD GAS, ED
ACID-BASE DEFICIT: 1 mmol/L (ref 0.0–2.0)
BICARBONATE: 22 mmol/L (ref 20.0–28.0)
O2 SAT: 99 %
PCO2 VEN: 27.6 mmHg — AB (ref 44.0–60.0)
PH VEN: 7.51 — AB (ref 7.250–7.430)
PO2 VEN: 104 mmHg — AB (ref 32.0–45.0)
Patient temperature: 98.7
TCO2: 23 mmol/L (ref 22–32)

## 2018-10-10 LAB — I-STAT CG4 LACTIC ACID, ED
Lactic Acid, Venous: 3.21 mmol/L (ref 0.5–1.9)
Lactic Acid, Venous: 3.49 mmol/L (ref 0.5–1.9)
Lactic Acid, Venous: 4.05 mmol/L (ref 0.5–1.9)

## 2018-10-10 LAB — GLUCOSE, CAPILLARY: Glucose-Capillary: 70 mg/dL (ref 70–99)

## 2018-10-10 LAB — MRSA PCR SCREENING: MRSA BY PCR: NEGATIVE

## 2018-10-10 MED ORDER — VALPROIC ACID 250 MG PO CAPS
500.0000 mg | ORAL_CAPSULE | Freq: Three times a day (TID) | ORAL | Status: DC
  Filled 2018-10-10: qty 2

## 2018-10-10 MED ORDER — ACETAMINOPHEN 650 MG RE SUPP: 650.0000 mg | Freq: Four times a day (QID) | RECTAL | Status: DC | PRN

## 2018-10-10 MED ORDER — ALBUTEROL SULFATE (2.5 MG/3ML) 0.083% IN NEBU: 2.5000 mg | INHALATION_SOLUTION | RESPIRATORY_TRACT | Status: DC | PRN

## 2018-10-10 MED ORDER — LACTATED RINGERS IV BOLUS
1000.0000 mL | Freq: Once | INTRAVENOUS | Status: AC
  Administered 2018-10-10: 1000 mL via INTRAVENOUS

## 2018-10-10 MED ORDER — METHYLPREDNISOLONE SODIUM SUCC 40 MG IJ SOLR
40.0000 mg | Freq: Two times a day (BID) | INTRAMUSCULAR | Status: DC
  Administered 2018-10-10 – 2018-10-12 (×3): 40 mg via INTRAVENOUS
  Filled 2018-10-10 (×3): qty 1

## 2018-10-10 MED ORDER — PREDNISONE 5 MG PO TABS: 5.0000 mg | ORAL_TABLET | Freq: Three times a day (TID) | ORAL | Status: DC

## 2018-10-10 MED ORDER — PANTOPRAZOLE SODIUM 40 MG PO TBEC: 40.0000 mg | DELAYED_RELEASE_TABLET | Freq: Two times a day (BID) | ORAL | Status: DC

## 2018-10-10 MED ORDER — ONDANSETRON HCL 4 MG/2ML IJ SOLN: 4.0000 mg | Freq: Four times a day (QID) | INTRAMUSCULAR | Status: DC | PRN

## 2018-10-10 MED ORDER — HYDROCORTISONE NA SUCCINATE PF 100 MG IJ SOLR
100.0000 mg | Freq: Once | INTRAMUSCULAR | Status: AC
  Administered 2018-10-10: 100 mg via INTRAVENOUS
  Filled 2018-10-10: qty 2

## 2018-10-10 MED ORDER — VANCOMYCIN HCL IN DEXTROSE 1-5 GM/200ML-% IV SOLN
1000.0000 mg | Freq: Once | INTRAVENOUS | Status: AC
  Administered 2018-10-10: 1000 mg via INTRAVENOUS

## 2018-10-10 MED ORDER — METOPROLOL SUCCINATE ER 25 MG PO TB24: 25.0000 mg | ORAL_TABLET | Freq: Every day | ORAL | Status: DC

## 2018-10-10 MED ORDER — ONDANSETRON HCL 4 MG PO TABS: 4.0000 mg | ORAL_TABLET | Freq: Four times a day (QID) | ORAL | Status: DC | PRN

## 2018-10-10 MED ORDER — FENTANYL CITRATE (PF) 100 MCG/2ML IJ SOLN
50.0000 ug | Freq: Once | INTRAMUSCULAR | Status: AC
  Administered 2018-10-10: 50 ug via INTRAVENOUS
  Filled 2018-10-10: qty 2

## 2018-10-10 MED ORDER — SODIUM CHLORIDE 0.9 % IV SOLN
2.0000 g | INTRAVENOUS | Status: DC
  Filled 2018-10-10: qty 2

## 2018-10-10 MED ORDER — SODIUM CHLORIDE 0.9 % IV SOLN: 250.0000 mL | INTRAVENOUS | Status: DC | PRN

## 2018-10-10 MED ORDER — ALLOPURINOL 300 MG PO TABS
300.0000 mg | ORAL_TABLET | Freq: Every day | ORAL | Status: DC
  Administered 2018-10-11 – 2018-10-14 (×4): 300 mg via ORAL
  Filled 2018-10-10: qty 3
  Filled 2018-10-10: qty 1
  Filled 2018-10-10: qty 3
  Filled 2018-10-10: qty 1

## 2018-10-10 MED ORDER — SODIUM CHLORIDE 0.9 % IV SOLN
2.0000 g | Freq: Once | INTRAVENOUS | Status: AC
  Administered 2018-10-10: 2 g via INTRAVENOUS

## 2018-10-10 MED ORDER — METRONIDAZOLE IN NACL 5-0.79 MG/ML-% IV SOLN
500.0000 mg | Freq: Three times a day (TID) | INTRAVENOUS | Status: DC
  Administered 2018-10-11: 500 mg via INTRAVENOUS
  Filled 2018-10-10: qty 100

## 2018-10-10 MED ORDER — SODIUM CHLORIDE 0.9% FLUSH
3.0000 mL | Freq: Two times a day (BID) | INTRAVENOUS | Status: DC
  Administered 2018-10-11 – 2018-10-14 (×5): 3 mL via INTRAVENOUS

## 2018-10-10 MED ORDER — VALPROATE SODIUM 500 MG/5ML IV SOLN
500.0000 mg | Freq: Three times a day (TID) | INTRAVENOUS | Status: DC
  Administered 2018-10-11 – 2018-10-12 (×5): 500 mg via INTRAVENOUS
  Filled 2018-10-10 (×6): qty 5

## 2018-10-10 MED ORDER — ACETAMINOPHEN 325 MG PO TABS
650.0000 mg | ORAL_TABLET | Freq: Four times a day (QID) | ORAL | Status: DC | PRN
  Administered 2018-10-13 – 2018-10-14 (×2): 650 mg via ORAL
  Filled 2018-10-10 (×2): qty 2

## 2018-10-10 MED ORDER — SODIUM CHLORIDE 0.9% FLUSH
3.0000 mL | Freq: Two times a day (BID) | INTRAVENOUS | Status: DC
  Administered 2018-10-11 – 2018-10-13 (×5): 3 mL via INTRAVENOUS

## 2018-10-10 MED ORDER — METRONIDAZOLE IN NACL 5-0.79 MG/ML-% IV SOLN
500.0000 mg | Freq: Three times a day (TID) | INTRAVENOUS | Status: DC
  Administered 2018-10-10: 500 mg via INTRAVENOUS
  Filled 2018-10-10: qty 100

## 2018-10-10 MED ORDER — ACETAMINOPHEN 500 MG PO TABS
1000.0000 mg | ORAL_TABLET | Freq: Once | ORAL | Status: AC
  Administered 2018-10-10: 1000 mg via ORAL
  Filled 2018-10-10: qty 2

## 2018-10-10 MED ORDER — NYSTATIN 100000 UNIT/GM EX POWD
Freq: Two times a day (BID) | CUTANEOUS | Status: DC
  Administered 2018-10-10: 15:00:00 via TOPICAL
  Filled 2018-10-10: qty 15

## 2018-10-10 MED ORDER — INSULIN ASPART 100 UNIT/ML ~~LOC~~ SOLN
0.0000 [IU] | Freq: Three times a day (TID) | SUBCUTANEOUS | Status: DC
  Administered 2018-10-11: 2 [IU] via SUBCUTANEOUS
  Administered 2018-10-11: 3 [IU] via SUBCUTANEOUS
  Administered 2018-10-12: 1 [IU] via SUBCUTANEOUS
  Administered 2018-10-12 (×2): 2 [IU] via SUBCUTANEOUS
  Administered 2018-10-13: 3 [IU] via SUBCUTANEOUS
  Administered 2018-10-13: 7 [IU] via SUBCUTANEOUS
  Administered 2018-10-14 (×2): 3 [IU] via SUBCUTANEOUS

## 2018-10-10 MED ORDER — ALBUMIN HUMAN 25 % IV SOLN
25.0000 g | Freq: Once | INTRAVENOUS | Status: AC
  Administered 2018-10-11: 25 g via INTRAVENOUS
  Filled 2018-10-10: qty 50

## 2018-10-10 MED ORDER — LORAZEPAM 2 MG/ML IJ SOLN
1.0000 mg | Freq: Once | INTRAMUSCULAR | Status: AC
  Administered 2018-10-10: 1 mg via INTRAVENOUS
  Filled 2018-10-10: qty 1

## 2018-10-10 MED ORDER — PREDNISONE 5 MG PO TABS: 15.0000 mg | ORAL_TABLET | Freq: Three times a day (TID) | ORAL | Status: DC

## 2018-10-10 MED ORDER — FAMOTIDINE IN NACL 20-0.9 MG/50ML-% IV SOLN
20.0000 mg | Freq: Two times a day (BID) | INTRAVENOUS | Status: DC
  Administered 2018-10-11 (×3): 20 mg via INTRAVENOUS
  Filled 2018-10-10 (×4): qty 50

## 2018-10-10 MED ORDER — VANCOMYCIN HCL IN DEXTROSE 1-5 GM/200ML-% IV SOLN: 1000.0000 mg | INTRAVENOUS | Status: DC

## 2018-10-10 MED ORDER — INSULIN ASPART 100 UNIT/ML ~~LOC~~ SOLN
0.0000 [IU] | Freq: Every day | SUBCUTANEOUS | Status: DC
  Administered 2018-10-11: 4 [IU] via SUBCUTANEOUS
  Administered 2018-10-12: 2 [IU] via SUBCUTANEOUS

## 2018-10-10 MED ORDER — SODIUM CHLORIDE 0.9% FLUSH: 3.0000 mL | INTRAVENOUS | Status: DC | PRN

## 2018-10-10 MED ORDER — ENSURE ENLIVE PO LIQD
237.0000 mL | Freq: Two times a day (BID) | ORAL | Status: DC
  Administered 2018-10-11 – 2018-10-14 (×6): 237 mL via ORAL

## 2018-10-10 MED ORDER — VANCOMYCIN HCL IN DEXTROSE 1-5 GM/200ML-% IV SOLN
INTRAVENOUS | Status: AC
  Filled 2018-10-10: qty 200

## 2018-10-10 MED ORDER — CEFEPIME HCL 2 G IJ SOLR
INTRAMUSCULAR | Status: AC
  Filled 2018-10-10: qty 2

## 2018-10-10 MED ORDER — MORPHINE SULFATE (PF) 2 MG/ML IV SOLN
2.0000 mg | INTRAVENOUS | Status: DC | PRN
  Administered 2018-10-10: 2 mg via INTRAVENOUS
  Filled 2018-10-10: qty 1

## 2018-10-10 MED ORDER — OXYCODONE HCL 20 MG PO TABS: 20.0000 mg | ORAL_TABLET | Freq: Four times a day (QID) | ORAL | Status: DC | PRN

## 2018-10-10 MED ORDER — ATORVASTATIN CALCIUM 10 MG PO TABS: 10.0000 mg | ORAL_TABLET | Freq: Every day | ORAL | Status: DC

## 2018-10-10 NOTE — ED Notes (Signed)
2 attempts for IV by this RN.  2 attempts by Linton Rump, RN

## 2018-10-10 NOTE — ED Triage Notes (Signed)
Per EMS:  Pt having lethargy for couple of days.  Yesterday told family that she didn't feel well.  Pt has history of UTI.  Family believes she may have one.

## 2018-10-10 NOTE — ED Triage Notes (Signed)
I got sick last night. Vomited last night per patient.

## 2018-10-10 NOTE — Progress Notes (Signed)
Pharmacy Antibiotic Note  Amber [name] is a 62 y.o. female admitted on 10/10/2018 with sepsis.  Pharmacy has been consulted for vancomycin and cefepime dosing. Flagyl per MD.   WBC 16.4, Tmax 100.4, LA 3.21 SCr 0.87, CrCl ~ 46 mL/min  Plan: Vancomycin 1000mg  IV x 1, then 1000mg  IV every 24 hours Cefepime 2g IV every 24h F/u Cx, clinical progression, LOT Monitor renal function, vancomycin level at steady state   Height: 5' (152.4 cm) IBW/kg (Calculated) : 45.5  Temp (24hrs), Avg:99.6 F (37.6 C), Min:98.7 F (37.1 C), Max:100.4 F (38 C)  Recent Labs  Lab 10/10/18 1312 10/10/18 1411  WBC 16.4*  --   CREATININE 0.87  --   LATICACIDVEN  --  3.21*    Estimated Creatinine Clearance: 49.4 mL/min (by C-G formula based on SCr of 0.87 mg/dL).    Allergies  Allergen Reactions  . Ivp Dye [Iodinated Diagnostic Agents] Itching  . Metrizamide Itching    Antimicrobials this admission: Vanc 11/21>> Cefepime 11/21>> Flagyl 11/21>>  Dose adjustments this admission: n/a  Microbiology results: 11/21 BCx: sent 11/21 UCx: sent  12/21, PharmD Clinical Pharmacist Please check AMION for all [facility] Pharmacy numbers 10/10/2018 2:36 PM

## 2018-10-10 NOTE — ED Notes (Signed)
Attempted two more IV sticks, pt is not tolerating venipuncture

## 2018-10-10 NOTE — Treatment Plan (Signed)
62 yo F with hx afib (not on anticoagulation given hx of recent GI bleeding in October 2019), seizures, hypertension, COPD and multiple other medical problems presenting with HCAP.  Presented with weakness, confusion to [facility].  Imaging showed bilateral patchy ground glass nodular densities concerning for multifocal pulmonary infection.  CT also concerning for interstitial edema at lung bases.  Enlarging lytic lesion in R proximal humerus (infxn, arthropathy, or neoplasm).  Further imaging pending.  CT of abdomen notable for anasarca.  Vitals notable for fever on presentation (100.4) and tachycardia.  BP's stable recently.  Most recent HR improved at 119 per discussion with EDP.  S/p vanc/flagyl/cefepime for HCAP and 2 L LR for sepsis.  Lactate initially uptrended now improving.  Stress dose steroids also started by EDP as pt on chronic steroids. Requested influenza swab Follow up shoulder film  Admit to stepdown at [facility]. Of note, labs notable for  Leukocytosis and anemia and thrombocytopenia.  Anemia is down when compared to discharge from prior hospitalization, but no evidence of bleeding and from chart review, pt received 1 unit pRBC for Hb of 5.9 on 11/7 but no further transfusions and Hb at discharge on 11/10 was 12.9 (suspect some hemoconcentration), but will need to follow closely.

## 2018-10-10 NOTE — ED Notes (Signed)
Report given to Mackensie, RN

## 2018-10-10 NOTE — ED Notes (Signed)
Updated pt's daughter to delay of transport

## 2018-10-10 NOTE — ED Notes (Signed)
Multiple attempts to obtain 2 nd IV access, unabe to obtain 2 nd set of culture. IV therapy in route due to sepsis diagnosis.

## 2018-10-10 NOTE — ED Notes (Signed)
Pt's daughter, Candee Furbish leaving, will meet patient at [facility], phone number [phone]

## 2018-10-10 NOTE — H&P (Signed)
History and Physical    Amber [name] ZOX:[id number] DOB: 03-Apr-1956 DOA: 10/10/2018  PCP: [name], MD   Patient coming from: Home, by way of [facility]  Chief Complaint: Lethargy, N/V   HPI: Amber [name] is a 62 y.o. female with medical history significant for chronic atrial fibrillation no longer on anticoagulation due to GI bleeding, chronic pain, chronic steroid use, seizure disorder, arthritis, history of CVA, and recent admission with septic shock secondary to E. coli UTI, now presenting with lethargy, nausea, and vomiting.  Patient's family reported worsening lethargy over the past couple days with some confusion, nausea, and vomiting.  There was no blood or dark vomitus noted.  Patient had complained to a family member of some abdominal pain yesterday.  She required blood transfusion during the recent hospitalization but family has not noted any melena or hematochezia.  SNF was recommended during the recent hospitalization, but patient's family elected to bring her back home.  [facility] ED Course: Upon arrival to the ED, patient is found to be febrile to 38 C, saturating 90% on room air, tachypneic, tachycardic, and with blood pressure 100/75.  EKG features atrial fibrillation with rate 147 and QTc interval 517 ms.  Chest x-ray was negative for acute finding.  Chemistry panel is notable for an albumin of 1.7.  CBC features a new leukocytosis to 16,400, normocytic anemia with hemoglobin of 8.1, down from 12.9 earlier this month, and a thrombocytopenia which has improved to 106,000.  Lactic acid was elevated to 3.21.  Noncontrast head CT is negative for acute intracranial abnormality.  CT of the chest and abdomen and pelvis was obtained and most notable for bilateral patchy groundglass nodular densities concerning for multifocal pneumonia.  CT findings also include interstitial edema and anasarca.  Blood and urine culture was ordered, influenza PCR was ordered, patient was given 2 L  lactated Ringer's, 100 mg IV Solu-Cortef, vancomycin, cefepime, and Flagyl in the ED.  Heart rate improved some, blood pressure remained stable, and transferred to [facility] was arranged for further evaluation and management.  Review of Systems:  All other systems reviewed and apart from HPI, are negative.  Past Medical History:  Diagnosis Date  . ACS (acute coronary syndrome) (HCC) 06/12/2015  . Acute on chronic blood loss anemia 11/25/2017  . Arthritis   . Asthma   . 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 Chronic pain   . Constipation   . Diabetes mellitus without complication (HCC)   . Duodenitis   . Embolic stroke (HCC)   . Falls frequently   . Focal seizure (HCC)   . GERD (gastroesophageal reflux disease)   . Hypertension   . Impacted fracture 02/15/2017   right glenoid  . Pneumonia 10/2015  . Seizures (HCC)    last seizure March 2015  . Small bowel obstruction (HCC) 11/25/2017    Past Surgical History:  Procedure Laterality Date  . APPENDECTOMY    . BACK SURGERY    . CHOLECYSTECTOMY    . TEE WITHOUT CARDIOVERSION N/A 10/30/2016   Procedure: TRANSESOPHAGEAL ECHOCARDIOGRAM (TEE);  Surgeon: Lars Masson, MD;  Location: [facility] END[facility]COPY;  Service: Cardiovascular;  Laterality: N/A;     reports that she quit smoking about 9 years ago. She has never used smokeless tobacco. She reports that she does not drink alcohol or use drugs.  Allergies  Allergen Reactions  . Ivp Dye [Iodinated Diagnostic Agents] Itching  . Metrizamide Itching    Family History  Problem Relation Age of Onset  . Kidney failure Mother   . Hypertension Sister   . Hypertension Brother      Prior to Admission medications   Medication Sig Start Date End Date Taking? Authorizing Provider  allopurinol (ZYLOPRIM) 300 MG tablet Take 300 mg by mouth daily. 11/11/16   [provider]  atorvastatin (LIPITOR) 10 MG tablet Take 10 mg by mouth daily at 6 PM.   08/13/15   [provider]  feeding supplement, ENSURE ENLIVE, (ENSURE ENLIVE) LIQD Take 237 mLs by mouth 2 (two) times daily between meals. Patient not taking: Reported on 09/24/2018 11/10/15   Catarina Hartshorn, MD  ferrous sulfate 325 (65 FE) MG tablet Take 1 tablet (325 mg total) by mouth daily with breakfast. 11/27/17   Rozann Lesches, MD  levETIRAcetam (KEPPRA) 500 MG tablet Take 1 tablet (500 mg total) by mouth 2 (two) times daily. Patient not taking: Reported on 09/24/2018 05/13/18 09/24/26  Zigmund Daniel., MD  metoprolol succinate (TOPROL-XL) 25 MG 24 hr tablet Take 25 mg by mouth daily.    [provider]  Oxycodone HCl 20 MG TABS Take 1 tablet (20 mg total) by mouth 4 (four) times daily. 07/31/18   Lawyer, Cristal Deer, PA-C  pantoprazole (PROTONIX) 40 MG tablet Take 40 mg by mouth 2 (two) times daily. 08/21/18   [provider]  predniSONE (DELTASONE) 5 MG tablet Take 5 mg by mouth 3 (three) times daily.    [provider]  valproic acid (DEPAKENE) 250 MG capsule Take 500 mg by mouth 3 (three) times daily.    [provider]  Vitamin D, Ergocalciferol, (DRISDOL) 50000 UNITS CAPS capsule Take 1 capsule (50,000 Units total) by mouth every 7 (seven) days. Sunday Patient not taking: Reported on 09/24/2018 01/27/15   Calvert Cantor, MD    Physical Exam: Vitals:   10/10/18 1900 10/10/18 1934 10/10/18 2000 10/10/18 2030  BP:  105/74 111/82 112/84  Pulse:  (!) 128 (!) 125   Resp: 11 18 19 17   Temp:      TempSrc:      SpO2:  98% 100% 100%  Height:        Constitutional: NAD, calm  Eyes: PERTLA, lids and conjunctivae normal ENMT: Mucous membranes are moist. Posterior pharynx clear of any exudate or lesions.   Neck: normal, supple, no masses, no thyromegaly Respiratory: mild tachypnea. Scattered rhonchi. No pallor. No accessory muscle use.  Cardiovascular: Rate ~120 and regular. 3+ pitting edema to bilateral LE's. Abdomen: No distension, soft, mild  generalized tenderness without rebound pain or guarding. Bowel sounds active.  Musculoskeletal: no clubbing / cyanosis. No joint deformity upper and lower extremities.    Skin: no significant rashes, lesions, ulcers. Warm, dry, well-perfused. Neurologic: No gross facial asymmetry. Dysarthric. Sensation intact. Moving all extremities.  Psychiatric: Alert. Easily distracted, labile emotions.    Labs on Admission: I have personally reviewed following labs and imaging studies  CBC: Recent Labs  Lab 10/10/18 1312  WBC 16.4*  NEUTROABS 12.3*  HGB 8.1*  HCT 26.7*  MCV 89.6  PLT 106*   Basic Metabolic Panel: Recent Labs  Lab 10/10/18 1312  NA 142  K 4.0  CL 113*  CO2 22  GLUCOSE 193*  BUN 16  CREATININE 0.87  CALCIUM 7.7*   GFR: Estimated Creatinine Clearance: 49.4 mL/min (by C-G formula based on SCr of 0.87 mg/dL). Liver Function Tests: Recent Labs  Lab 10/10/18 1312  AST 23  ALT 18  ALKPHOS 100  BILITOT 0.5  PROT 5.2*  ALBUMIN 1.7*   No results for input(s): LIPASE, AMYLASE in the last 168 hours. No results for input(s): AMMONIA in the last 168 hours. Coagulation Profile: No results for input(s): INR, PROTIME in the last 168 hours. Cardiac Enzymes: No results for input(s): CKTOTAL, CKMB, CKMBINDEX, TROPONINI in the last 168 hours. BNP (last 3 results) No results for input(s): PROBNP in the last 8760 hours. HbA1C: No results for input(s): HGBA1C in the last 72 hours. CBG: No results for input(s): GLUCAP in the last 168 hours. Lipid Profile: No results for input(s): CHOL, HDL, LDLCALC, TRIG, CHOLHDL, LDLDIRECT in the last 72 hours. Thyroid Function Tests: No results for input(s): TSH, T4TOTAL, FREET4, T3FREE, THYROIDAB in the last 72 hours. Anemia Panel: No results for input(s): VITAMINB12, FOLATE, FERRITIN, TIBC, IRON, RETICCTPCT in the last 72 hours. Urine analysis:    Component Value Date/Time   COLORURINE YELLOW 10/10/2018 1406   APPEARANCEUR CLEAR  10/10/2018 1406   LABSPEC 1.015 10/10/2018 1406   PHURINE 6.5 10/10/2018 1406   GLUCOSEU NEGATIVE 10/10/2018 1406   HGBUR NEGATIVE 10/10/2018 1406   BILIRUBINUR NEGATIVE 10/10/2018 1406   KETONESUR NEGATIVE 10/10/2018 1406   PROTEINUR NEGATIVE 10/10/2018 1406   UROBILINOGEN 1.0 07/27/2015 1052   NITRITE NEGATIVE 10/10/2018 1406   LEUKOCYTESUR NEGATIVE 10/10/2018 1406   Sepsis Labs: @LABRCNTIP (procalcitonin:4,lacticidven:4) )No results found for this or any previous visit (from the past 240 hour(s)).   Radiological Exams on Admission: Ct Abdomen Pelvis Wo Contrast  Result Date: 10/10/2018 CLINICAL DATA:  Abdominal pain IV contrast allergy.  No oral contrast per ordering MD EXAM: CT CHEST, ABDOMEN AND PELVIS WITHOUT CONTRAST TECHNIQUE: Multidetector CT imaging of the chest, abdomen and pelvis was performed following the standard protocol without IV contrast. COMPARISON:  Chest CT 09/09/2018 FINDINGS: CT CHEST FINDINGS Cardiovascular: Significant streak artifact of the mediastinum. No pericardial effusion. No gross abnormality of the heart or great vessels. Mediastinum/Nodes: No axillary or supraclavicular adenopathy. No mediastinal hilar adenopathy. Lungs/Pleura: Bilateral perihilar patchy ground-glass densities these ground-glass densities or more nodular in shape in the inferior lingula measuring up to 12 mm No pleural fluid. No pneumothorax. Interlobular septal thickening at the lung bases. Musculoskeletal: Posterior thoracic fusion. Severe degenerative arthropathy at the glenohumeral joint. There is a well-circumscribed lytic lesion the proximal femur humerus which is enlarging over comparison exams from 2018. Lesion is seen on axial image 12/12 and on the CT scout measuring approximately 12 mm. CT ABDOMEN AND PELVIS FINDINGS Hepatobiliary: No focal hepatic lesion on noncontrast exam. Pancreas: Chronic calcifications throughout the pancreas. Spleen: Normal spleen Adrenals/urinary tract: Adrenal  glands and kidneys are normal. The ureters and bladder normal. Stomach/Bowel: Stomach, small bowel, appendix, and cecum are normal. The colon and rectosigmoid colon are normal. Vascular/Lymphatic: Abdominal aorta is normal caliber with atherosclerotic calcification. There is no retroperitoneal or periportal lymphadenopathy. No pelvic lymphadenopathy. Reproductive: Uterus and ovaries normal. Other: No free fluid.  Anasarca of the soft tissues. Musculoskeletal: No aggressive osseous lesion IMPRESSION: Chest Impression: 1. Bilateral patchy ground-glass nodular densities concerning for multifocal pulmonary infection. 2. Mild interlobular septal thickening in the lung bases suggests interstitial edema 3. No lymphadenopathy. 4. 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 5. Enlarging lytic lesion in the proximal RIGHT humerus differential including infection, erosive arthropathy or neoplasm. Consider dedicated films of the RIGHT shoulder. Abdomen / Pelvis Impression: 1. Anasarca of the soft tissues. 2.  Aortic Atherosclerosis (ICD10-I70.0). Electronically Signed   By: Genevive Bi M.D.   On: 10/10/2018  16:14   Dg Chest 2 View  Result Date: 10/10/2018 CLINICAL DATA:  Lethargy, weakness. EXAM: CHEST - 2 VIEW COMPARISON:  Radiograph of September 25, 2018. FINDINGS: Stable cardiomediastinal silhouette. No pneumothorax or pleural effusion is noted. Old right rib fractures are noted. Status post surgical posterior fusion. No acute pulmonary disease is noted. IMPRESSION: No active cardiopulmonary disease. Electronically Signed   By: Lupita Raider, M.D.   On: 10/10/2018 12:33   Ct Head Wo Contrast  Result Date: 10/10/2018 CLINICAL DATA:  62 year old female with lethargy, vomiting, sepsis. EXAM: CT HEAD WITHOUT CONTRAST TECHNIQUE: Contiguous axial images were obtained from the base of the skull through the vertex without intravenous contrast. COMPARISON:  Brain MRI 09/29/2018. Head CT 09/26/2018, and  earlier. FINDINGS: Brain: Right MCA territory encephalomalacia with Patchy and confluent bilateral cerebral white matter hypodensity, and bilateral thalamic heterogeneity. Superimposed bilateral small chronic cerebellar infarcts, better demonstrated by MRI. No midline shift, ventriculomegaly, mass effect, evidence of mass lesion, intracranial hemorrhage or evidence of cortically based acute infarction. Vascular: Calcified atherosclerosis at the skull base. No suspicious intracranial vascular hyperdensity. Skull: Osteopenia.  No acute osseous abnormality identified. Sinuses/Orbits: Visualized paranasal sinuses and mastoids are stable and well pneumatized. Other: No acute orbit or scalp soft tissue finding. IMPRESSION: 1. No acute intracranial abnormality. 2. Stable appearance of advanced chronic ischemic disease. Electronically Signed   By: Odessa Fleming M.D.   On: 10/10/2018 15:56   Ct Chest Wo Contrast  Result Date: 10/10/2018 CLINICAL DATA:  Abdominal pain IV contrast allergy.  No oral contrast per ordering MD EXAM: CT CHEST, ABDOMEN AND PELVIS WITHOUT CONTRAST TECHNIQUE: Multidetector CT imaging of the chest, abdomen and pelvis was performed following the standard protocol without IV contrast. COMPARISON:  Chest CT 09/09/2018 FINDINGS: CT CHEST FINDINGS Cardiovascular: Significant streak artifact of the mediastinum. No pericardial effusion. No gross abnormality of the heart or great vessels. Mediastinum/Nodes: No axillary or supraclavicular adenopathy. No mediastinal hilar adenopathy. Lungs/Pleura: Bilateral perihilar patchy ground-glass densities these ground-glass densities or more nodular in shape in the inferior lingula measuring up to 12 mm No pleural fluid. No pneumothorax. Interlobular septal thickening at the lung bases. Musculoskeletal: Posterior thoracic fusion. Severe degenerative arthropathy at the glenohumeral joint. There is a well-circumscribed lytic lesion the proximal femur humerus which is  enlarging over comparison exams from 2018. Lesion is seen on axial image 12/12 and on the CT scout measuring approximately 12 mm. CT ABDOMEN AND PELVIS FINDINGS Hepatobiliary: No focal hepatic lesion on noncontrast exam. Pancreas: Chronic calcifications throughout the pancreas. Spleen: Normal spleen Adrenals/urinary tract: Adrenal glands and kidneys are normal. The ureters and bladder normal. Stomach/Bowel: Stomach, small bowel, appendix, and cecum are normal. The colon and rectosigmoid colon are normal. Vascular/Lymphatic: Abdominal aorta is normal caliber with atherosclerotic calcification. There is no retroperitoneal or periportal lymphadenopathy. No pelvic lymphadenopathy. Reproductive: Uterus and ovaries normal. Other: No free fluid.  Anasarca of the soft tissues. Musculoskeletal: No aggressive osseous lesion IMPRESSION: Chest Impression: 1. Bilateral patchy ground-glass nodular densities concerning for multifocal pulmonary infection. 2. Mild interlobular septal thickening in the lung bases suggests interstitial edema 3. No lymphadenopathy. 4. 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 5. Enlarging lytic lesion in the proximal RIGHT humerus differential including infection, erosive arthropathy or neoplasm. Consider dedicated films of the RIGHT shoulder. Abdomen / Pelvis Impression: 1. Anasarca of the soft tissues. 2.  Aortic Atherosclerosis (ICD10-I70.0). Electronically Signed   By: Genevive Bi M.D.   On: 10/10/2018 16:14   Dg Shoulder Right  Port  Result Date: 10/10/2018 CLINICAL DATA:  Right shoulder arthropathy. EXAM: PORTABLE RIGHT SHOULDER COMPARISON:  Chest CT 09/09/2018 and 10/10/2018 and prior radiographs 11/18/2017. 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 Marked erosive changes involving the humeral head and neck and glenoid. Remote right rib fractures are noted.  The right lung is clear.  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 Electronically Signed   By: Rudie Meyer M.D.   On: 10/10/2018 19:24    EKG: Independently reviewed. Atrial fibrillation with RVR (rate 147), QTc 517.   Assessment/Plan  1. Sepsis secondary to PNA; ?aspiration  - Presents with lethargy, N/V, and confusion - Found to have fever, tachycardia, new leukocytosis, elevated lactate, and CT findings concerning for multifocal PNA  - Given her confusion and recent N/V, there is concern for aspiration  - Blood and urine cultures collected in ED, 30 cc/kg LR bolus given, and she was started on broad-spectrum empiric antibiotics  - BP remains stable, HR normalized, and lactate improving  - Continue empiric abx, follow cultures and clinical course, consult SLP for aspiration concerns    2. Normocytic anemia; thrombocytopenia    - Hgb is 8.1 on admission, down from 12.9 earlier this month after she was transfused  - Platelets 106k on admission, improved from recent hospitalization  - No active bleeding, likely d/t chronic disease, will trend    3. Paroxysymal atrial fibrillation  - Had a fib with rapid rate in ED, now back in sinus  - CHADS-VASc is at least 5 (age, gender, CVA x2, DM)  - No longer anticoagulated d/t GIB  - Resume Toprol pending SLP eval   4. Anasarca  - Preserved EF on echo earlier this month  - Likely driven by low albumin, consider dietary consultation pending SLP eval    5. History of CVA  - No acute findings on head CT  - Plan to resume statin once more appropriate for diet    6. Type II DM  - Check CBG's and use SSI with Novolog while in hospital    7. Steroid-dependence  - Patient takes 15 mg prednisone daily for unclear indication  - No hypotension or electrolyte abnormalities  - Treated with 100 mg IV hydrocortisone in ED  - Continue steroid with Solu-Medrol while NPO to avoid crisis   8. Acute encephalopathy  - No  acute findings on CT head  - Likely related to infection and improvement expected with continued treatment as above    9. Chronic pain  - Continue pain-control  10. Failure to thrive; protein-calorie malnutrition  - Appears very frail, albumin only 1.7  - Family declined SNF earlier this month   11. Seizure disorder  - Continue valproic acid    DVT prophylaxis: SCD's  Code Status: Full  Family Communication: Discussed with patient Consults called: None Admission status: Inpatient     Briscoe Deutscher, MD [facility] Pager [phone]  If 7PM-7AM, please contact night-coverage [email or web address] Password [email or web address]  10/10/2018, 11:12 PM

## 2018-10-10 NOTE — ED Notes (Signed)
EMT went in to validate vitals, Dr. Clarene Duke is attempting and IV. Vitals will be reasessed once Dr is done.

## 2018-10-10 NOTE — ED Provider Notes (Addendum)
[facility] EMERGENCY DEPARTMENT Provider Note   CSN: [id number] Arrival date & time: 10/10/18  1101     History   Chief Complaint Chief Complaint  Patient presents with  . Fatigue    HPI Amber [name] is a 62 y.o. female.  63 year old female with extensive past medical history including CVA, type 2 diabetes mellitus, seizures, SBO, frequent UTIs who presents with lethargy.  Son states that patient was her usual self yesterday.  Today she has been lethargic and confused.  He states that this is how she has gotten in the past with urinary tract infections.  She was discharged from the hospital on 11/12 after hospitalization for septic shock due to UTI.  She has niece who lives with her and home health nurses.  She complained of abdominal pain earlier and had one episode of nausea and vomiting today.  Currently for me she denies any complaints.  LEVEL 5 CAVEAT DUE TO AMS  The history is provided by a relative.    Past Medical History:  Diagnosis Date  . ACS (acute coronary syndrome) (HCC) 06/12/2015  . Acute on chronic blood loss anemia 11/25/2017  . Arthritis   . Asthma   . 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 Chronic pain   . Constipation   . Diabetes mellitus without complication (HCC)   . Duodenitis   . Embolic stroke (HCC)   . Falls frequently   . Focal seizure (HCC)   . GERD (gastroesophageal reflux disease)   . Hypertension   . Impacted fracture 02/15/2017   right glenoid  . Pneumonia 10/2015  . Seizures (HCC)    last seizure March 2015  . Small bowel obstruction (HCC) 11/25/2017    Patient Active Problem List   Diagnosis Date Noted  . HCAP (healthcare-associated pneumonia) 09/24/2018  . Acute metabolic encephalopathy 05/09/2018  . Metabolic acidosis 05/09/2018  . Mild persistent asthma 05/09/2018  . 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 Moderate malnutrition (HCC) 05/07/2018  . PAF (paroxysmal atrial fibrillation) (HCC)  05/07/2018  . Nausea & vomiting 05/06/2018  . Chronic anemia 05/06/2018  . PFO (patent foramen ovale) 02/15/2017  . History of CVA with residual deficit 02/15/2017  . Drug-induced hyperglycemia 02/15/2017  . 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 Abdominal pain 11/07/2015  . Constipation 11/07/2015  . Hypomagnesemia 08/25/2015  . Diabetes mellitus type 2, controlled (HCC) 07/27/2015  . Chronic back pain   . Frequent falls   . Essential hypertension   . Pulmonary hypertension (HCC)   . Gout   . Microcytic anemia 01/26/2015  . History of seizure 01/26/2015    Past Surgical History:  Procedure Laterality Date  . APPENDECTOMY    . BACK SURGERY    . CHOLECYSTECTOMY    . TEE WITHOUT CARDIOVERSION N/A 10/30/2016   Procedure: TRANSESOPHAGEAL ECHOCARDIOGRAM (TEE);  Surgeon: Lars Masson, MD;  Location: [facility] ENDOSCOPY;  Service: Cardiovascular;  Laterality: N/A;     OB History   None      Home Medications    Prior to Admission medications   Medication Sig Start Date End Date Taking? Authorizing Provider  allopurinol (ZYLOPRIM) 300 MG tablet Take 300 mg by mouth daily. 11/11/16   [provider]  atorvastatin (LIPITOR) 10 MG tablet Take 10 mg by mouth daily at 6 PM.  08/13/15   [provider]  feeding supplement, ENSURE ENLIVE, (ENSURE ENLIVE) LIQD Take 237 mLs by mouth 2 (two) times daily between meals.  Patient not taking: Reported on 09/24/2018 11/10/15   Catarina Hartshorn, MD  ferrous sulfate 325 (65 FE) MG tablet Take 1 tablet (325 mg total) by mouth daily with breakfast. 11/27/17   Rozann Lesches, MD  levETIRAcetam (KEPPRA) 500 MG tablet Take 1 tablet (500 mg total) by mouth 2 (two) times daily. Patient not taking: Reported on 09/24/2018 05/13/18 09/24/26  Zigmund Daniel., MD  metoprolol succinate (TOPROL-XL) 25 MG 24 hr tablet Take 25 mg by mouth daily.    [provider]  Oxycodone HCl 20 MG TABS Take 1 tablet (20 mg  total) by mouth 4 (four) times daily. 07/31/18   Lawyer, Cristal Deer, PA-C  pantoprazole (PROTONIX) 40 MG tablet Take 40 mg by mouth 2 (two) times daily. 08/21/18   [provider]  predniSONE (DELTASONE) 5 MG tablet Take 5 mg by mouth 3 (three) times daily.    [provider]  valproic acid (DEPAKENE) 250 MG capsule Take 500 mg by mouth 3 (three) times daily.    [provider]  Vitamin D, Ergocalciferol, (DRISDOL) 50000 UNITS CAPS capsule Take 1 capsule (50,000 Units total) by mouth every 7 (seven) days. Sunday Patient not taking: Reported on 09/24/2018 01/27/15   Calvert Cantor, MD    Family History Family History  Problem Relation Age of Onset  . Kidney failure Mother   . Hypertension Sister   . Hypertension Brother     Social History Social History   Tobacco Use  . Smoking status: Former Smoker    Last attempt to quit: 2010    Years since quitting: 9.8  . Smokeless tobacco: Never Used  Substance Use Topics  . Alcohol use: No    Comment: admits to 2 drinks/week  . Drug use: No     Allergies   Ivp dye [iodinated diagnostic agents] and Metrizamide   Review of Systems Review of Systems  Unable to perform ROS: Mental status change     Physical Exam Updated Vital Signs BP 99/75 (BP Location: Right Arm)   Pulse (!) 111   Temp (!) 100.4 F (38 C) (Rectal)   Resp 15   Ht 5' (1.524 m)   SpO2 99%   BMI 19.89 kg/m   Physical Exam  Constitutional: She appears well-developed.  Frail, cachectic, ill appearing  HENT:  Head: Normocephalic and atraumatic.  dry mucous membranes  Eyes: Pupils are equal, round, and reactive to light. Conjunctivae are normal.  Neck: No JVD present. No tracheal deviation present.  Cardiovascular: Regular rhythm and normal heart sounds. Tachycardia present.  No murmur heard. Pulmonary/Chest: Effort normal and breath sounds normal.  Abdominal: Soft. Bowel sounds are normal. She exhibits no distension. There is no  tenderness.  Genitourinary:  Genitourinary Comments: Diffuse erythema and skin irritation in groin  Musculoskeletal: She exhibits no edema.  Neurological:  Confused, able to open eyes to voice but not able to follow most commands, global increased muscle tone  Skin: Skin is warm and dry.  Nursing note and vitals reviewed.    ED Treatments / Results  Labs (all labs ordered are listed, but only abnormal results are displayed) Labs Reviewed  COMPREHENSIVE METABOLIC PANEL - Abnormal; Notable for the following components:      Result Value   Chloride 113 (*)    Glucose, Bld 193 (*)    Calcium 7.7 (*)    Total Protein 5.2 (*)    Albumin 1.7 (*)    All other components within normal limits  CBC WITH DIFFERENTIAL/PLATELET -  Abnormal; Notable for the following components:   WBC 16.4 (*)    RBC 2.98 (*)    Hemoglobin 8.1 (*)    HCT 26.7 (*)    RDW 21.2 (*)    Platelets 106 (*)    Neutro Abs 12.3 (*)    Monocytes Absolute 1.6 (*)    Abs Immature Granulocytes 0.15 (*)    All other components within normal limits  I-STAT CG4 LACTIC ACID, ED - Abnormal; Notable for the following components:   Lactic Acid, Venous 3.21 (*)    All other components within normal limits  I-STAT VENOUS BLOOD GAS, ED - Abnormal; Notable for the following components:   pH, Ven 7.510 (*)    pCO2, Ven 27.6 (*)    pO2, Ven 104.0 (*)    All other components within normal limits  URINE CULTURE  CULTURE, BLOOD (ROUTINE X 2)  CULTURE, BLOOD (ROUTINE X 2)  URINALYSIS, ROUTINE W REFLEX MICROSCOPIC  I-STAT CG4 LACTIC ACID, ED    EKG None  Radiology Dg Chest 2 View  Result Date: 10/10/2018 CLINICAL DATA:  Lethargy, weakness. EXAM: CHEST - 2 VIEW COMPARISON:  Radiograph of September 25, 2018. FINDINGS: Stable cardiomediastinal silhouette. No pneumothorax or pleural effusion is noted. Old right rib fractures are noted. Status post surgical posterior fusion. No acute pulmonary disease is noted. IMPRESSION: No  active cardiopulmonary disease. Electronically Signed   By: Lupita Raider, M.D.   On: 10/10/2018 12:33   Ct Head Wo Contrast  Result Date: 10/10/2018 CLINICAL DATA:  62 year old female with lethargy, vomiting, sepsis. EXAM: CT HEAD WITHOUT CONTRAST TECHNIQUE: Contiguous axial images were obtained from the base of the skull through the vertex without intravenous contrast. COMPARISON:  Brain MRI 09/29/2018. Head CT 09/26/2018, and earlier. FINDINGS: Brain: Right MCA territory encephalomalacia with Patchy and confluent bilateral cerebral white matter hypodensity, and bilateral thalamic heterogeneity. Superimposed bilateral small chronic cerebellar infarcts, better demonstrated by MRI. No midline shift, ventriculomegaly, mass effect, evidence of mass lesion, intracranial hemorrhage or evidence of cortically based acute infarction. Vascular: Calcified atherosclerosis at the skull base. No suspicious intracranial vascular hyperdensity. Skull: Osteopenia.  No acute osseous abnormality identified. Sinuses/Orbits: Visualized paranasal sinuses and mastoids are stable and well pneumatized. Other: No acute orbit or scalp soft tissue finding. IMPRESSION: 1. No acute intracranial abnormality. 2. Stable appearance of advanced chronic ischemic disease. Electronically Signed   By: Odessa Fleming M.D.   On: 10/10/2018 15:56    Procedures .Critical Care Performed by: Laurence Spates, MD Authorized by: Laurence Spates, MD   Critical care provider statement:    Critical care time (minutes):  30   Critical care time was exclusive of:  Separately billable procedures and treating other patients   Critical care was necessary to treat or prevent imminent or life-threatening deterioration of the following conditions:  Sepsis   Critical care was time spent personally by me on the following activities:  Development of treatment plan with patient or surrogate, evaluation of patient's response to treatment, examination of  patient, obtaining history from patient or surrogate, ordering and performing treatments and interventions, ordering and review of laboratory studies, ordering and review of radiographic studies and review of old charts   (including critical care time)  Medications Ordered in ED Medications  metroNIDAZOLE (FLAGYL) IVPB 500 mg (has no administration in time range)  vancomycin (VANCOCIN) IVPB 1000 mg/200 mL premix (1,000 mg Intravenous New Bag/Given 10/10/18 1550)  nystatin (MYCOSTATIN/NYSTOP) topical powder ( Topical Given 10/10/18 1445)  ceFEPIme (MAXIPIME) 2 g injection (has no administration in time range)  ceFEPIme (MAXIPIME) 2 g in sodium chloride 0.9 % 100 mL IVPB (has no administration in time range)  vancomycin (VANCOCIN) IVPB 1000 mg/200 mL premix (has no administration in time range)  vancomycin (VANCOCIN) 1-5 GM/200ML-% IVPB (has no administration in time range)  lactated ringers bolus 1,000 mL (1,000 mLs Intravenous New Bag/Given 10/10/18 1548)  lactated ringers bolus 1,000 mL (0 mLs Intravenous Stopped 10/10/18 1529)  ceFEPIme (MAXIPIME) 2 g in sodium chloride 0.9 % 100 mL IVPB (0 g Intravenous Stopped 10/10/18 1529)  acetaminophen (TYLENOL) tablet 1,000 mg (1,000 mg Oral Given 10/10/18 1446)   Angiocath insertion Performed by: Ambrose Finland   Consent: Verbal consent obtained. Risks and benefits: risks, benefits and alternatives were discussed Time out: Immediately prior to procedure a "time out" was called to verify the correct patient, procedure, equipment, support staff and site/side marked as required.  Preparation: Patient was prepped and draped in the usual sterile fashion.  Vein Location: left EJ   Gauge: 20   Normal blood return and flush without difficulty Patient tolerance: Patient tolerated the procedure well with no immediate complications.      Initial Impression / Assessment and Plan / ED Course  I have reviewed the triage vital signs and the  nursing notes.  Pertinent labs & imaging results that were available during my care of the patient were reviewed by me and considered in my medical decision making (see chart for details).     Pt ill appearing and cachectic on exam, mildly tachycardic.  Initially her temp was normal but later she spiked a fever to 100.4 and code sepsis was initiated with broad-spectrum antibiotics. there was a delay in lab work due to difficult IV access, I eventually placed left EJ.   Labs show venous pH 7.51, CO2 27, lactate 3.21, UA negative, calcium 7.7, WBC 16.4, hemoglobin 8.1.  Her hemoglobin is lower than time of discharge from recent hospitalization however she did receive a blood transfusion during hospitalization according to son.  She had previous hospitalization for GI bleed at [facility] where colonoscopy showed diverticulosis and upper endoscopy showed bleeding ulcers.  She is currently not on Eliquis.  She is already on Protonix daily.  Given her recent GI work-up and known potential source of bleeding, I recommended trending hemoglobin in the hospital. CXR negative, head CT negative acute.  As of her report of abdominal pain as well as vomiting, I have ordered CT of abdomen and pelvis for further evaluation.  I have also ordered CT chest to evaluate for occult pneumonia as she is at risk given recent hospitalization. I am signing out to oncoming provider, Dr. Fredderick Phenix, pending studies. She will require admission.  Final Clinical Impressions(s) / ED Diagnoses   Final diagnoses:  None    ED Discharge Orders    None       , Ambrose Finland, MD 10/10/18 1622    , Ambrose Finland, MD 10/10/18 1626

## 2018-10-10 NOTE — ED Notes (Addendum)
Multiple attempts for IV , pt refuses external  jugular access. Provider at bedside attempting [place] IV.

## 2018-10-11 DIAGNOSIS — L8990 Pressure ulcer of unspecified site, unspecified stage: Secondary | ICD-10-CM

## 2018-10-11 LAB — CBC WITH DIFFERENTIAL/PLATELET
ABS IMMATURE GRANULOCYTES: 0.09 10*3/uL — AB (ref 0.00–0.07)
Basophils Absolute: 0 10*3/uL (ref 0.0–0.1)
Basophils Relative: 0 %
EOS PCT: 0 %
Eosinophils Absolute: 0 10*3/uL (ref 0.0–0.5)
HEMATOCRIT: 22.6 % — AB (ref 36.0–46.0)
Hemoglobin: 7 g/dL — ABNORMAL LOW (ref 12.0–15.0)
Immature Granulocytes: 1 %
LYMPHS PCT: 7 %
Lymphs Abs: 0.8 10*3/uL (ref 0.7–4.0)
MCH: 27.3 pg (ref 26.0–34.0)
MCHC: 31 g/dL (ref 30.0–36.0)
MCV: 88.3 fL (ref 80.0–100.0)
MONO ABS: 0.2 10*3/uL (ref 0.1–1.0)
MONOS PCT: 2 %
Neutro Abs: 9.6 10*3/uL — ABNORMAL HIGH (ref 1.7–7.7)
Neutrophils Relative %: 90 %
Platelets: 86 10*3/uL — ABNORMAL LOW (ref 150–400)
RBC: 2.56 MIL/uL — AB (ref 3.87–5.11)
RDW: 20.9 % — ABNORMAL HIGH (ref 11.5–15.5)
WBC: 10.6 10*3/uL — AB (ref 4.0–10.5)
nRBC: 0 % (ref 0.0–0.2)

## 2018-10-11 LAB — GLUCOSE, CAPILLARY
GLUCOSE-CAPILLARY: 78 mg/dL (ref 70–99)
GLUCOSE-CAPILLARY: 316 mg/dL — AB (ref 70–99)
Glucose-Capillary: 74 mg/dL (ref 70–99)
Glucose-Capillary: 170 mg/dL — ABNORMAL HIGH (ref 70–99)
Glucose-Capillary: 232 mg/dL — ABNORMAL HIGH (ref 70–99)

## 2018-10-11 LAB — URINE CULTURE: Culture: NO GROWTH

## 2018-10-11 LAB — STREP PNEUMONIAE URINARY ANTIGEN: STREP PNEUMO URINARY ANTIGEN: NEGATIVE

## 2018-10-11 LAB — ABO/RH: ABO/RH(D): O POS

## 2018-10-11 LAB — PREPARE RBC (CROSSMATCH)

## 2018-10-11 LAB — BASIC METABOLIC PANEL
Anion gap: 8 (ref 5–15)
BUN: 16 mg/dL (ref 6–20)
CHLORIDE: 116 mmol/L — AB (ref 98–111)
CO2: 21 mmol/L — ABNORMAL LOW (ref 22–32)
CREATININE: 0.72 mg/dL (ref 0.44–1.00)
Calcium: 7.8 mg/dL — ABNORMAL LOW (ref 8.9–10.3)
GFR calc Af Amer: >60 mL/min (ref >60)
Glucose, Bld: 88 mg/dL (ref 70–99)
Potassium: 3.8 mmol/L (ref 3.5–5.1)
SODIUM: 145 mmol/L (ref 135–145)

## 2018-10-11 LAB — CBC
HEMATOCRIT: 28.7 % — AB (ref 36.0–46.0)
HEMOGLOBIN: 9.2 g/dL — AB (ref 12.0–15.0)
MCH: 28.1 pg (ref 26.0–34.0)
MCHC: 32.1 g/dL (ref 30.0–36.0)
MCV: 87.8 fL (ref 80.0–100.0)
NRBC: 0 % (ref 0.0–0.2)
Platelets: 79 10*3/uL — ABNORMAL LOW (ref 150–400)
RBC: 3.27 MIL/uL — ABNORMAL LOW (ref 3.87–5.11)
RDW: 18.9 % — ABNORMAL HIGH (ref 11.5–15.5)
WBC: 6.1 10*3/uL (ref 4.0–10.5)

## 2018-10-11 LAB — LACTIC ACID, PLASMA
LACTIC ACID, VENOUS: 1.8 mmol/L (ref 0.5–1.9)
Lactic Acid, Venous: 2.4 mmol/L (ref 0.5–1.9)

## 2018-10-11 MED ORDER — LEVALBUTEROL HCL 0.63 MG/3ML IN NEBU: 0.6300 mg | INHALATION_SOLUTION | Freq: Three times a day (TID) | RESPIRATORY_TRACT | Status: DC

## 2018-10-11 MED ORDER — IPRATROPIUM BROMIDE 0.02 % IN SOLN
0.5000 mg | Freq: Three times a day (TID) | RESPIRATORY_TRACT | Status: DC
  Filled 2018-10-11 (×2): qty 2.5

## 2018-10-11 MED ORDER — NYSTATIN 100000 UNIT/ML MT SUSP
5.0000 mL | Freq: Four times a day (QID) | OROMUCOSAL | Status: DC
  Administered 2018-10-11 – 2018-10-14 (×7): 500000 [IU] via ORAL
  Filled 2018-10-11 (×8): qty 5

## 2018-10-11 MED ORDER — PIPERACILLIN-TAZOBACTAM 3.375 G IVPB
3.3750 g | Freq: Three times a day (TID) | INTRAVENOUS | Status: DC
  Administered 2018-10-11 – 2018-10-14 (×10): 3.375 g via INTRAVENOUS
  Filled 2018-10-11 (×8): qty 50

## 2018-10-11 MED ORDER — IPRATROPIUM BROMIDE 0.02 % IN SOLN: 0.5000 mg | Freq: Four times a day (QID) | RESPIRATORY_TRACT | Status: DC

## 2018-10-11 MED ORDER — SODIUM CHLORIDE 0.9% IV SOLUTION
Freq: Once | INTRAVENOUS | Status: AC
  Administered 2018-10-11: 500 mL via INTRAVENOUS

## 2018-10-11 MED ORDER — DEXTROSE 5 % IV SOLN
INTRAVENOUS | Status: AC
  Administered 2018-10-11: 09:00:00 via INTRAVENOUS

## 2018-10-11 MED ORDER — FLUCONAZOLE 100 MG PO TABS
200.0000 mg | ORAL_TABLET | Freq: Once | ORAL | Status: AC
  Administered 2018-10-11: 200 mg via ORAL
  Filled 2018-10-11: qty 2

## 2018-10-11 MED ORDER — ALBUMIN HUMAN 25 % IV SOLN
25.0000 g | Freq: Once | INTRAVENOUS | Status: AC
  Administered 2018-10-11: 25 g via INTRAVENOUS
  Filled 2018-10-11: qty 100

## 2018-10-11 MED ORDER — CHLORHEXIDINE GLUCONATE 0.12 % MT SOLN
15.0000 mL | Freq: Two times a day (BID) | OROMUCOSAL | Status: DC
  Administered 2018-10-11 – 2018-10-14 (×5): 15 mL via OROMUCOSAL
  Filled 2018-10-11 (×6): qty 15

## 2018-10-11 MED ORDER — SODIUM CHLORIDE 0.9 % IV BOLUS
250.0000 mL | Freq: Once | INTRAVENOUS | Status: AC
  Administered 2018-10-11: 250 mL via INTRAVENOUS

## 2018-10-11 MED ORDER — ORAL CARE MOUTH RINSE
15.0000 mL | Freq: Two times a day (BID) | OROMUCOSAL | Status: DC
  Administered 2018-10-11: 15 mL via OROMUCOSAL

## 2018-10-11 MED ORDER — IPRATROPIUM BROMIDE 0.02 % IN SOLN
0.5000 mg | Freq: Four times a day (QID) | RESPIRATORY_TRACT | Status: DC
  Administered 2018-10-11 (×3): 0.5 mg via RESPIRATORY_TRACT
  Filled 2018-10-11 (×2): qty 2.5

## 2018-10-11 MED ORDER — LEVALBUTEROL HCL 0.63 MG/3ML IN NEBU
0.6300 mg | INHALATION_SOLUTION | Freq: Four times a day (QID) | RESPIRATORY_TRACT | Status: DC
  Administered 2018-10-11 (×3): 0.63 mg via RESPIRATORY_TRACT
  Filled 2018-10-11 (×2): qty 3

## 2018-10-11 MED ORDER — LEVALBUTEROL HCL 0.63 MG/3ML IN NEBU
0.6300 mg | INHALATION_SOLUTION | Freq: Three times a day (TID) | RESPIRATORY_TRACT | Status: DC
  Filled 2018-10-11 (×2): qty 3

## 2018-10-11 MED ORDER — FLUCONAZOLE 100 MG PO TABS
100.0000 mg | ORAL_TABLET | Freq: Every day | ORAL | Status: DC
  Administered 2018-10-12 – 2018-10-14 (×3): 100 mg via ORAL
  Filled 2018-10-11 (×3): qty 1

## 2018-10-11 MED ORDER — LEVALBUTEROL HCL 0.63 MG/3ML IN NEBU: 0.6300 mg | INHALATION_SOLUTION | Freq: Four times a day (QID) | RESPIRATORY_TRACT | Status: DC | PRN

## 2018-10-11 NOTE — Progress Notes (Signed)
CRITICAL VALUE ALERT  Critical Value:  Hgb 7.0  Date & Time Notied:  10/11/2018 0405  Provider Notified: Opyd  Orders Received/Actions taken: 1 Unit PRBCS RN will transfuse.

## 2018-10-11 NOTE — Progress Notes (Signed)
CRITICAL VALUE ALERT  Critical Value:  Lactic Acid 2.4  Date & Time Notied:  10/11/2018 0042  Provider Notified: Opyd  Orders Received/Actions taken: No fluids

## 2018-10-11 NOTE — Progress Notes (Signed)
PROGRESS NOTE    Amber [name]  ZOX:[id number] DOB: 29-Nov-1955 DOA: 10/10/2018 PCP: [name], MD   Brief Narrative:  HPI per Dr. Odie Sera on 10/10/18 Amber [name] is a 62 y.o. female with medical history significant for chronic atrial fibrillation no longer on anticoagulation due to GI bleeding, chronic pain, chronic steroid use, seizure disorder, arthritis, history of CVA, and recent admission with septic shock secondary to E. coli UTI, now presenting with lethargy, nausea, and vomiting.  Patient's family reported worsening lethargy over the past couple days with some confusion, nausea, and vomiting.  There was no blood or dark vomitus noted.  Patient had complained to a family member of some abdominal pain yesterday.  She required blood transfusion during the recent hospitalization but family has not noted any melena or hematochezia.  SNF was recommended during the recent hospitalization, but patient's family elected to bring her back home.  [facility] ED Course: Upon arrival to the ED, patient is found to be febrile to 38 C, saturating 90% on room air, tachypneic, tachycardic, and with blood pressure 100/75.  EKG features atrial fibrillation with rate 147 and QTc interval 517 ms.  Chest x-ray was negative for acute finding.  Chemistry panel is notable for an albumin of 1.7.  CBC features a new leukocytosis to 16,400, normocytic anemia with hemoglobin of 8.1, down from 12.9 earlier this month, and a thrombocytopenia which has improved to 106,000.  Lactic acid was elevated to 3.21.  Noncontrast head CT is negative for acute intracranial abnormality.  CT of the chest and abdomen and pelvis was obtained and most notable for bilateral patchy groundglass nodular densities concerning for multifocal pneumonia.  CT findings also include interstitial edema and anasarca.  Blood and urine culture was ordered, influenza PCR was ordered, patient was given 2 L lactated Ringer's, 100 mg IV  Solu-Cortef, vancomycin, cefepime, and Flagyl in the ED.  Heart rate improved some, blood pressure remained stable, and transferred to [facility] was arranged for further evaluation and management.  **She is to undergo swallow evaluation today and speech saw her and placed on a dysphagia 3 diet with thin liquids.  Antibiotic regimen has been adjusted and she will be placed on just IV Zosyn.  We will continue monitor patient's respiratory status carefully  Assessment & Plan:   Principal Problem:   Sepsis due to pneumonia [facility]) Active Problems:   History of seizure   Diabetes mellitus type 2, controlled (HCC)   Chronic back pain   History of CVA with residual deficit   Chronic anemia   Paroxysmal atrial fibrillation with RVR (HCC)   Acute metabolic encephalopathy   Failure to thrive in adult   Steroid-dependent COPD (HCC)   Anasarca   Pressure injury of skin  Sepsis secondary to multifocal pneumonia; ?aspiration  - Presents with lethargy, N/V, and confusion - Found to have fever, tachycardia, new leukocytosis, elevated lactate, and CT findings concerning for multifocal PNA  - Given her confusion and recent N/V, there is concern for aspiration  - Blood and urine cultures collected in ED, 30 cc/kg LR bolus given, and she was started on broad-spectrum empiric antibiotics; started on maintenance IV fluids with D5W normal saline rate of 5 mils per hour for 12 hours - BP remains stable, HR normalized, and lactate improving  trended down and lactate is now went from 4.05 and is now 1.8 -SLP consulted for further evaluations and recommending dysphagia 3 diet -Antibiotics have been adjusted and we  have stopped IV cefepime IV vancomycin and IV metronidazole and have started patient on IV Zosyn -Continue monitor and repeat chest x-ray in the a.m.  Normocytic anemia; thrombocytopenia    - Hgb is 8.1 on admission, down from 12.9 earlier this month after she was transfused  repeat this  morning was 7.0 - Platelets 86,000 on admission, improved from recent hospitalization  - Will type and screen and transfuse 1 unit of PRBCs -Continue to monitor for signs and symptoms of bleeding and follow-up CBC after transfusion 1 unit   Paroxysymal atrial fibrillation  - Had a fib with rapid rate in ED, now back in sinus  - CHADS-VASc is at least 61 (age, gender, CVA x2, DM)  - No longer anticoagulated d/t GIB  - Further review shows that she is no longer on Toprol and will need to verify this  Anasarca  - Preserved EF on echo earlier this month  - Likely driven by low albumin -We will get nutritionist involved but currently was given IV fluid hydration with D5 normal saline rate of 75 mL's for 12 hours  HIstory of CVA  - No acute findings on head CT  - Plan to resume statin once more appropriate for diet   and likely will be done tomorrow  Type II DM  - Check CBG's and use SSI with Novolog while in hospital   adjusted medication regimen to sensitive NovoLog sliding scale every 4 while she was n.p.o. for this morning's speech evaluation  Steroid-dependence  - Patient takes 15 mg prednisone daily for unclear indication  - No hypotension or electrolyte abnormalities  - Treated with 100 mg IV hydrocortisone in ED  - Continue steroid with Solu-Medrol while NPO to avoid crisis  continue currently at 40 mg every 12  Acute encephalopathy, improved - No acute findings on CT head  - Likely related to infection and improvement expected with continued treatment as above   -Continue monitor closely  Chronic pain  - Continue pain-control  Failure to thrive; protein-calorie malnutrition  - Appears very frail, albumin only 1.7  - Family declined SNF earlier this month  -Get nutritionist involved now that she can swallow -Treated with Ensure Enlive p.o. twice daily  Seizure disorder  - Continue valproic acid  changed to IV form and then changed back to p.o. tomorrow  a.m. -Acacian review shows that she is no longer taking levetiracetam  Thrush -Placed on fluconazole 200 mg p.o. once and then 100 mg p.o. daily afterwards along with next session swish and swallow  GERD -Continue with pantoprazole IV and change back to p.o. in the a.m.  DVT prophylaxis: SCDs Code Status: Full code Family Communication: Family present at bedside Disposition Plan: Will get PT evaluation   Consultants:   SLP  Procedures:   None  Antimicrobials:  Anti-infectives (From admission, onward)   Start     Dose/Rate Route Frequency Ordered Stop   10/12/18 1000  fluconazole (DIFLUCAN) tablet 100 mg     100 mg Oral Daily 10/11/18 1550     10/11/18 1600  fluconazole (DIFLUCAN) tablet 200 mg     200 mg Oral  Once 10/11/18 1550 10/11/18 1733   10/11/18 1500  ceFEPIme (MAXIPIME) 2 g in sodium chloride 0.9 % 100 mL IVPB  Status:  Discontinued     2 g 200 mL/hr over 30 Minutes Intravenous Every 24 hours 10/10/18 1452 10/11/18 0901   10/11/18 1500  vancomycin (VANCOCIN) IVPB 1000 mg/200 mL premix  Status:  Discontinued     1,000 mg 200 mL/hr over 60 Minutes Intravenous Every 24 hours 10/10/18 1453 10/11/18 0901   10/11/18 1000  piperacillin-tazobactam (ZOSYN) IVPB 3.375 g     3.375 g 12.5 mL/hr over 240 Minutes Intravenous Every 8 hours 10/11/18 0901     10/11/18 0200  metroNIDAZOLE (FLAGYL) IVPB 500 mg  Status:  Discontinued     500 mg 100 mL/hr over 60 Minutes Intravenous Every 8 hours 10/10/18 2327 10/11/18 0901   10/10/18 1515  vancomycin (VANCOCIN) 1-5 GM/200ML-% IVPB    Note to Pharmacy:  Daylene Posey   : cabinet override      10/10/18 1515 10/11/18 0329   10/10/18 1445  ceFEPIme (MAXIPIME) 2 g in sodium chloride 0.9 % 100 mL IVPB     2 g 200 mL/hr over 30 Minutes Intravenous  Once 10/10/18 1433 10/10/18 1529   10/10/18 1445  metroNIDAZOLE (FLAGYL) IVPB 500 mg  Status:  Discontinued     500 mg 100 mL/hr over 60 Minutes Intravenous Every 8 hours 10/10/18 1433  10/10/18 2312   10/10/18 1445  vancomycin (VANCOCIN) IVPB 1000 mg/200 mL premix     1,000 mg 200 mL/hr over 60 Minutes Intravenous  Once 10/10/18 1433 10/10/18 1711   10/10/18 1443  ceFEPIme (MAXIPIME) 2 g injection    Note to Pharmacy:  Daylene Posey   : cabinet override      10/10/18 1443 10/11/18 0259     Subjective: Seen and examined at bedside and was awake and alert.  Her main concern was wanting to eat.  No chest pain, lightheadedness or dizziness.  States she does have some shortness of breath.  No other concerns or complaints at this time.  Objective: Vitals:   10/11/18 1500 10/11/18 1520 10/11/18 1600 10/11/18 1700  BP: (!) 130/92 (!) 157/104 128/88 100/71  Pulse: 88 (!) 102 97 (!) 108  Resp: 16 16 15 15   Temp:  (!) 97.5 F (36.4 C)    TempSrc:  Oral    SpO2: 100% 100% 100% 100%  Weight:      Height:        Intake/Output Summary (Last 24 hours) at 10/11/2018 1800 Last data filed at 10/11/2018 1600 Gross per 24 hour  Intake 1552.36 ml  Output 100 ml  Net 1452.36 ml   Filed Weights   10/11/18 0319  Weight: 45 kg    Examination: Physical Exam:  Constitutional: Thin, African-American female currently NAD and appears calm but slightly uncomfortable Eyes: Lids and conjunctivae normal, sclerae anicteric  ENMT: External Ears, Nose appear normal. Grossly normal hearing.  Neck: Appears normal, supple, no cervical masses, normal ROM, no appreciable thyromegaly; no JVD Respiratory: Diminished to auscultation bilaterally with some rhonchi but no appreciable wheezing, rales, crackles. Normal respiratory effort and patient is not tachypenic. No accessory muscle use. Wearing supplemental O2 via [place] Cardiovascular: Slightly tachycardic, no murmurs / rubs / gallops. S1 and S2 auscultated. 2+ LE extremity edema. Abdomen: Soft, mildly tender, non-distended. No masses palpated.  Bowel sounds positive x4.  GU: Deferred. Musculoskeletal: No clubbing / cyanosis of digits/nails.     Skin: No rashes, lesions, ulcers on a limited skin evaluation. No induration; Warm and dry.  Neurologic: CN 2-12 grossly intact with no focal deficits but is dysarthric. Romberg sign and cerebellar reflexes not assessed.  Psychiatric: Normal judgment and insight. Alert and oriented x 3. Anxious mood and appropriate affect.   Data Reviewed: I have personally reviewed following labs and imaging studies  CBC: Recent Labs  Lab 10/10/18 1312 10/11/18 0202  WBC 16.4* 10.6*  NEUTROABS 12.3* 9.6*  HGB 8.1* 7.0*  HCT 26.7* 22.6*  MCV 89.6 88.3  PLT 106* 86*   Basic Metabolic Panel: Recent Labs  Lab 10/10/18 1312 10/11/18 0202  NA 142 145  K 4.0 3.8  CL 113* 116*  CO2 22 21*  GLUCOSE 193* 88  BUN 16 16  CREATININE 0.87 0.72  CALCIUM 7.7* 7.8*   GFR: Estimated Creatinine Clearance: 53.1 mL/min (by C-G formula based on SCr of 0.72 mg/dL). Liver Function Tests: Recent Labs  Lab 10/10/18 1312  AST 23  ALT 18  ALKPHOS 100  BILITOT 0.5  PROT 5.2*  ALBUMIN 1.7*   No results for input(s): LIPASE, AMYLASE in the last 168 hours. No results for input(s): AMMONIA in the last 168 hours. Coagulation Profile: No results for input(s): INR, PROTIME in the last 168 hours. Cardiac Enzymes: No results for input(s): CKTOTAL, CKMB, CKMBINDEX, TROPONINI in the last 168 hours. BNP (last 3 results) No results for input(s): PROBNP in the last 8760 hours. HbA1C: No results for input(s): HGBA1C in the last 72 hours. CBG: Recent Labs  Lab 10/10/18 2330 10/11/18 0326 10/11/18 0803 10/11/18 1257 10/11/18 1745  GLUCAP 70 78 74 170* 232*   Lipid Profile: No results for input(s): CHOL, HDL, LDLCALC, TRIG, CHOLHDL, LDLDIRECT in the last 72 hours. Thyroid Function Tests: No results for input(s): TSH, T4TOTAL, FREET4, T3FREE, THYROIDAB in the last 72 hours. Anemia Panel: No results for input(s): VITAMINB12, FOLATE, FERRITIN, TIBC, IRON, RETICCTPCT in the last 72 hours. Sepsis Labs: Recent  Labs  Lab 10/10/18 1643 10/10/18 1713 10/10/18 2328 10/11/18 0202  LATICACIDVEN 4.05* 3.49* 2.4* 1.8    Recent Results (from the past 240 hour(s))  Urine culture     Status: None   Collection Time: 10/10/18  2:05 PM  Result Value Ref Range Status   Specimen Description   Final    URINE, CATHETERIZED Performed at [facility], [address] [place], [place] [address]    Special Requests   Final    NONE Performed at [facility], [address] [place], [place] [address]    Culture   Final    NO GROWTH Performed at [facility], [address], [place], [place] [address]    Report Status 10/11/2018 FINAL  Final  Culture, blood (routine x 2)     Status: None (Preliminary result)   Collection Time: 10/10/18  2:05 PM  Result Value Ref Range Status   Specimen Description   Final    BLOOD LEFT NECK Performed at [facility], [address] [place], [place] [address]    Special Requests   Final    BOTTLES DRAWN AEROBIC AND ANAEROBIC Blood Culture adequate volume JUGULAR VEIN Performed at [facility], [address] [place], [place] [address]    Culture   Final    NO GROWTH < 24 HOURS Performed at [facility], [address], [place], [place] [address]    Report Status PENDING  Incomplete  MRSA PCR Screening     Status: None   Collection Time: 10/10/18 10:09 PM  Result Value Ref Range Status   MRSA by PCR NEGATIVE NEGATIVE Final    Comment:        The GeneXpert MRSA Assay (FDA approved for NASAL specimens only), is one component of a comprehensive MRSA colonization  surveillance program. It is not intended to diagnose MRSA infection nor to guide or monitor treatment for MRSA infections. Performed at [facility], [address], [place], [place] [address]     RN Pressure Injury Documentation and I am in agreement with the nurse's assessment:  Pressure Injury 10/10/18 Stage II -  Partial  thickness loss of dermis presenting as a shallow open ulcer with a red, pink wound bed without slough. (Active)  10/10/18 2230   Location: Groin  Location Orientation: Right  Staging: Stage II -  Partial thickness loss of dermis presenting as a shallow open ulcer with a red, pink wound bed without slough.  Wound Description (Comments):   Present on Admission: Yes     Pressure Injury 10/10/18 Stage II -  Partial thickness loss of dermis presenting as a shallow open ulcer with a red, pink wound bed without slough. (Active)  10/10/18 2230   Location: Groin  Location Orientation: Left  Staging: Stage II -  Partial thickness loss of dermis presenting as a shallow open ulcer with a red, pink wound bed without slough.  Wound Description (Comments):   Present on Admission: Yes     Pressure Injury 10/10/18 Stage II -  Partial thickness loss of dermis presenting as a shallow open ulcer with a red, pink wound bed without slough. (Active)  10/10/18 2230   Location: Sacrum  Location Orientation: Mid  Staging: Stage II -  Partial thickness loss of dermis presenting as a shallow open ulcer with a red, pink wound bed without slough.  Wound Description (Comments):   Present on Admission: Yes     Pressure Injury 10/10/18 Stage II -  Partial thickness loss of dermis presenting as a shallow open ulcer with a red, pink wound bed without slough. (Active)  10/10/18 2230   Location: Buttocks  Location Orientation: Right  Staging: Stage II -  Partial thickness loss of dermis presenting as a shallow open ulcer with a red, pink wound bed without slough.  Wound Description (Comments):   Present on Admission: Yes     Pressure Injury 10/10/18 Stage II -  Partial thickness loss of dermis presenting as a shallow open ulcer with a red, pink wound bed without slough. (Active)  10/10/18 2230   Location: Buttocks  Location Orientation: Left  Staging: Stage II -  Partial thickness loss of dermis presenting as a  shallow open ulcer with a red, pink wound bed without slough.  Wound Description (Comments):   Present on Admission: Yes      Radiology Studies: Ct Abdomen Pelvis Wo Contrast  Result Date: 10/10/2018 CLINICAL DATA:  Abdominal pain IV contrast allergy.  No oral contrast per ordering MD EXAM: CT CHEST, ABDOMEN AND PELVIS WITHOUT CONTRAST TECHNIQUE: Multidetector CT imaging of the chest, abdomen and pelvis was performed following the standard protocol without IV contrast. COMPARISON:  Chest CT 09/09/2018 FINDINGS: CT CHEST FINDINGS Cardiovascular: Significant streak artifact of the mediastinum. No pericardial effusion. No gross abnormality of the heart or great vessels. Mediastinum/Nodes: No axillary or supraclavicular adenopathy. No mediastinal hilar adenopathy. Lungs/Pleura: Bilateral perihilar patchy ground-glass densities these ground-glass densities or more nodular in shape in the inferior lingula measuring up to 12 mm No pleural fluid. No pneumothorax. Interlobular septal thickening at the lung bases. Musculoskeletal: Posterior thoracic fusion. Severe degenerative arthropathy at the glenohumeral joint. There is a well-circumscribed lytic lesion the proximal femur humerus which is enlarging over comparison exams from 2018. Lesion is seen on axial image 12/12  and on the CT scout measuring approximately 12 mm. CT ABDOMEN AND PELVIS FINDINGS Hepatobiliary: No focal hepatic lesion on noncontrast exam. Pancreas: Chronic calcifications throughout the pancreas. Spleen: Normal spleen Adrenals/urinary tract: Adrenal glands and kidneys are normal. The ureters and bladder normal. Stomach/Bowel: Stomach, small bowel, appendix, and cecum are normal. The colon and rectosigmoid colon are normal. Vascular/Lymphatic: Abdominal aorta is normal caliber with atherosclerotic calcification. There is no retroperitoneal or periportal lymphadenopathy. No pelvic lymphadenopathy. Reproductive: Uterus and ovaries normal. Other: No  free fluid.  Anasarca of the soft tissues. Musculoskeletal: No aggressive osseous lesion IMPRESSION: Chest Impression: 1. Bilateral patchy ground-glass nodular densities concerning for multifocal pulmonary infection. 2. Mild interlobular septal thickening in the lung bases suggests interstitial edema 3. No lymphadenopathy. 4. 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 5. Enlarging lytic lesion in the proximal RIGHT humerus differential including infection, erosive arthropathy or neoplasm. Consider dedicated films of the RIGHT shoulder. Abdomen / Pelvis Impression: 1. Anasarca of the soft tissues. 2.  Aortic Atherosclerosis (ICD10-I70.0). Electronically Signed   By: Genevive Bi M.D.   On: 10/10/2018 16:14   Dg Chest 2 View  Result Date: 10/10/2018 CLINICAL DATA:  Lethargy, weakness. EXAM: CHEST - 2 VIEW COMPARISON:  Radiograph of September 25, 2018. FINDINGS: Stable cardiomediastinal silhouette. No pneumothorax or pleural effusion is noted. Old right rib fractures are noted. Status post surgical posterior fusion. No acute pulmonary disease is noted. IMPRESSION: No active cardiopulmonary disease. Electronically Signed   By: Lupita Raider, M.D.   On: 10/10/2018 12:33   Ct Head Wo Contrast  Result Date: 10/10/2018 CLINICAL DATA:  62 year old female with lethargy, vomiting, sepsis. EXAM: CT HEAD WITHOUT CONTRAST TECHNIQUE: Contiguous axial images were obtained from the base of the skull through the vertex without intravenous contrast. COMPARISON:  Brain MRI 09/29/2018. Head CT 09/26/2018, and earlier. FINDINGS: Brain: Right MCA territory encephalomalacia with Patchy and confluent bilateral cerebral white matter hypodensity, and bilateral thalamic heterogeneity. Superimposed bilateral small chronic cerebellar infarcts, better demonstrated by MRI. No midline shift, ventriculomegaly, mass effect, evidence of mass lesion, intracranial hemorrhage or evidence of cortically based acute infarction.  Vascular: Calcified atherosclerosis at the skull base. No suspicious intracranial vascular hyperdensity. Skull: Osteopenia.  No acute osseous abnormality identified. Sinuses/Orbits: Visualized paranasal sinuses and mastoids are stable and well pneumatized. Other: No acute orbit or scalp soft tissue finding. IMPRESSION: 1. No acute intracranial abnormality. 2. Stable appearance of advanced chronic ischemic disease. Electronically Signed   By: Odessa Fleming M.D.   On: 10/10/2018 15:56   Ct Chest Wo Contrast  Result Date: 10/10/2018 CLINICAL DATA:  Abdominal pain IV contrast allergy.  No oral contrast per ordering MD EXAM: CT CHEST, ABDOMEN AND PELVIS WITHOUT CONTRAST TECHNIQUE: Multidetector CT imaging of the chest, abdomen and pelvis was performed following the standard protocol without IV contrast. COMPARISON:  Chest CT 09/09/2018 FINDINGS: CT CHEST FINDINGS Cardiovascular: Significant streak artifact of the mediastinum. No pericardial effusion. No gross abnormality of the heart or great vessels. Mediastinum/Nodes: No axillary or supraclavicular adenopathy. No mediastinal hilar adenopathy. Lungs/Pleura: Bilateral perihilar patchy ground-glass densities these ground-glass densities or more nodular in shape in the inferior lingula measuring up to 12 mm No pleural fluid. No pneumothorax. Interlobular septal thickening at the lung bases. Musculoskeletal: Posterior thoracic fusion. Severe degenerative arthropathy at the glenohumeral joint. There is a well-circumscribed lytic lesion the proximal femur humerus which is enlarging over comparison exams from 2018. Lesion is seen on axial image 12/12 and on the CT scout measuring  approximately 12 mm. CT ABDOMEN AND PELVIS FINDINGS Hepatobiliary: No focal hepatic lesion on noncontrast exam. Pancreas: Chronic calcifications throughout the pancreas. Spleen: Normal spleen Adrenals/urinary tract: Adrenal glands and kidneys are normal. The ureters and bladder normal. Stomach/Bowel:  Stomach, small bowel, appendix, and cecum are normal. The colon and rectosigmoid colon are normal. Vascular/Lymphatic: Abdominal aorta is normal caliber with atherosclerotic calcification. There is no retroperitoneal or periportal lymphadenopathy. No pelvic lymphadenopathy. Reproductive: Uterus and ovaries normal. Other: No free fluid.  Anasarca of the soft tissues. Musculoskeletal: No aggressive osseous lesion IMPRESSION: Chest Impression: 1. Bilateral patchy ground-glass nodular densities concerning for multifocal pulmonary infection. 2. Mild interlobular septal thickening in the lung bases suggests interstitial edema 3. No lymphadenopathy. 4. 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 5. Enlarging lytic lesion in the proximal RIGHT humerus differential including infection, erosive arthropathy or neoplasm. Consider dedicated films of the RIGHT shoulder. Abdomen / Pelvis Impression: 1. Anasarca of the soft tissues. 2.  Aortic Atherosclerosis (ICD10-I70.0). Electronically Signed   By: Genevive Bi M.D.   On: 10/10/2018 16:14   Dg Shoulder Right Port  Result Date: 10/10/2018 CLINICAL DATA:  Right shoulder arthropathy. EXAM: PORTABLE RIGHT SHOULDER COMPARISON:  Chest CT 09/09/2018 and 10/10/2018 and prior radiographs 11/18/2017. 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 Marked erosive changes involving the humeral head and neck and glenoid. Remote right rib fractures are noted.  The right lung is clear. 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 Electronically Signed   By: Rudie Meyer M.D.   On: 10/10/2018 19:24   Scheduled Meds: . allopurinol  300 mg Oral Daily  . chlorhexidine  15 mL Mouth Rinse BID  . feeding supplement (ENSURE ENLIVE)  237 mL Oral BID BM  . [START ON 10/12/2018] fluconazole  100 mg Oral Daily  . insulin aspart  0-5 Units  Subcutaneous QHS  . insulin aspart  0-9 Units Subcutaneous TID WC  . levalbuterol  0.63 mg Nebulization Q6H   And  . ipratropium  0.5 mg Nebulization Q6H  . mouth rinse  15 mL Mouth Rinse q12n4p  . methylPREDNISolone (SOLU-MEDROL) injection  40 mg Intravenous Q12H  . nystatin  5 mL Oral QID  . sodium chloride flush  3 mL Intravenous Q12H  . sodium chloride flush  3 mL Intravenous Q12H   Continuous Infusions: . sodium chloride    . dextrose 75 mL/hr at 10/11/18 1522  . famotidine (PEPCID) IV Stopped (10/11/18 1156)  . piperacillin-tazobactam (ZOSYN)  IV Stopped (10/11/18 1455)  . valproate sodium Stopped (10/11/18 1636)    LOS: 1 day   Merlene Laughter, DO [facility] PAGER is on AMION  If 7PM-7AM, please contact night-coverage [email or web address] Password [facility] 10/11/2018, 6:00 PM

## 2018-10-11 NOTE — Evaluation (Signed)
Clinical/Bedside Swallow Evaluation Patient Details  Name: Amber [name] MRN: [id number] Date of Birth: Dec 22, 1955  Today's Date: 10/11/2018 Time: SLP Start Time (ACUTE ONLY): 0855 SLP Stop Time (ACUTE ONLY): 0930 SLP Time Calculation (min) (ACUTE ONLY): 35 min  Past Medical History:  Past Medical History:  Diagnosis Date  . ACS (acute coronary syndrome) (HCC) 06/12/2015  . Acute on chronic blood loss anemia 11/25/2017  . Arthritis   . Asthma   . 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 Chronic pain   . Constipation   . Diabetes mellitus without complication (HCC)   . Duodenitis   . Embolic stroke (HCC)   . Falls frequently   . Focal seizure (HCC)   . GERD (gastroesophageal reflux disease)   . Hypertension   . Impacted fracture 02/15/2017   right glenoid  . Pneumonia 10/2015  . Seizures (HCC)    last seizure March 2015  . Small bowel obstruction (HCC) 11/25/2017   Past Surgical History:  Past Surgical History:  Procedure Laterality Date  . APPENDECTOMY    . BACK SURGERY    . CHOLECYSTECTOMY    . TEE WITHOUT CARDIOVERSION N/A 10/30/2016   Procedure: TRANSESOPHAGEAL ECHOCARDIOGRAM (TEE);  Surgeon: Amber Masson, MD;  Location: [facility] ENDOSCOPY;  Service: Cardiovascular;  Laterality: N/A;   HPI:  62 y.o. female admitted 10/10/18 with hypotension and AMS, n/v now being treated for PNA and Sepsis. Hemoglobin was low and pt to receive 1 unit RBC.  Pt has FTT.  Recent [facility] admit earlier this month with pna, well as October admit to [facility] with pna/low hemoglobin.  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  .  PMH includes: CVA, HTN, Anemia, GERD, Seizures, , ACS, Frequent Falls, Back surgery.  Pt had prior BSE in January 2018 recommending regular diet and thin liquids but MBS was recommended given h/o esophageal issues, hoarse vocal quality, and recurrent PNA. MBS was performed in October 2019 at [facility] with  report suggesting an oral dysphagia although with flash penetration with thin liquids. Dys 2 diet and thin liquids were recommended per prior slp note.    Assessment / Plan / Recommendation Clinical Impression  Pt with symptoms of mild oral dysphagia c/b decreased oral manipulation, delayed transiting with solids but adequate eventual clearance.  She states she does not use her dentures for po and they were not present.  Pt accepted only small bites of pudding/graham cracker- after stating she was excessively hungry.  ? reasoning - concern for contribution to FTT.  No indications of aspiration and pt advises she wants to eat "regular" foods = dys3/thin indicated.  Pt also appears to have aphasia - as she presents with paraphasia.    Suspect if pt is experiencing aspiration it is likely post-swallow due to possible reflux or GI issues.    Pt found to have white areas on lateral tongue and buccal region - right- consistent with oral candidiasis.   SLP Visit Diagnosis: Dysphagia, oral phase (R13.11)    Aspiration Risk  Moderate aspiration risk    Diet Recommendation Dysphagia 3 (Mech soft);Thin liquid   Liquid Administration via: Cup;Straw Medication Administration: Whole meds with puree Supervision: Comment(have pt self feed as able) Compensations: Slow rate;Small sips/bites;Lingual sweep for clearance of pocketing Postural Changes: Seated upright at 90 degrees;Remain upright for at least 30 minutes after po intake    Other  Recommendations Oral Care Recommendations: Oral care BID   Follow  up Recommendations None      Frequency and Duration min 1 x/week  1 week       Prognosis Prognosis for Safe Diet Advancement: Fair Barriers to Reach Goals: Behavior;Time post onset      Swallow Study   General Date of Onset: 10/11/18 HPI: 62 y.o. female admitted 10/10/18 with hypotension and AMS, n/v now being treated for PNA and Sepsis. Hemoglobin was low and pt to receive 1 unit RBC.  Pt has  FTT.  Recent [facility] admit earlier this month with pna, well as October admit to [facility] with pna/low hemoglobin.  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  .  PMH includes: CVA, HTN, Anemia, GERD, Seizures, , ACS, Frequent Falls, Back surgery.  Pt had prior BSE in January 2018 recommending regular diet and thin liquids but MBS was recommended given h/o esophageal issues, hoarse vocal quality, and recurrent PNA. MBS was performed in October 2019 at [facility] with report suggesting an oral dysphagia although with flash penetration with thin liquids. Dys 2 diet and thin liquids were recommended per prior slp note.  Type of Study: Bedside Swallow Evaluation Previous Swallow Assessment: see HHX Diet Prior to this Study: NPO Temperature Spikes Noted: No Respiratory Status: Room air History of Recent Intubation: No Behavior/Cognition: Alert;Cooperative;Pleasant mood;Other (Comment)(speaks in childlike voice) Oral Cavity Assessment: Other (comment)(whitish areas on lateral tongue and right buccal region - concerning for oral candidiasis) Oral Care Completed by SLP: No Oral Cavity - Dentition: Edentulous;Dentures, not available(reports she does not use dentures) Vision: Functional for self-feeding Self-Feeding Abilities: Able to feed self Patient Positioning: Upright in bed Baseline Vocal Quality: Hoarse;Low vocal intensity Volitional Cough: Weak Volitional Swallow: Unable to elicit    Oral/Motor/Sensory Function Overall Oral Motor/Sensory Function: (? mild facial asymmetry on right - h/o right cva?)   Ice Chips Ice chips: Not tested   Thin Liquid Thin Liquid: Within functional limits Presentation: Straw;Cup;Self Fed    Nectar Thick Nectar Thick Liquid: Not tested   Honey Thick Honey Thick Liquid: Not tested   Puree Puree: Impaired Presentation: Self Fed;Spoon Oral Phase Impairments: Reduced lingual movement/coordination Oral Phase Functional  Implications: Prolonged oral transit   Solid     Solid: Impaired Presentation: Self Fed Oral Phase Impairments: Reduced lingual movement/coordination;Impaired mastication Oral Phase Functional Implications: Prolonged oral transit Other Comments: but adequate clearance      Chales Abrahams 10/11/2018,10:22 AM  Donavan Burnet, MS [facility] SLP Acute Rehab Services Pager [phone] Office [phone]

## 2018-10-11 NOTE — Care Management Note (Signed)
Case Management Note  Patient Details  Name: Amber [name] MRN: [id number] Date of Birth: 27-Jul-1956  Subjective/Objective:                   62 y.o. female with medical history significant for chronic atrial fibrillation no longer on anticoagulation due to GI bleeding, chronic pain, chronic steroid use, seizure disorder, arthritis, history of CVA, and recent admission with septic shock secondary to E. coli UTI, now presenting with lethargy, nausea, and vomiting.  Patient's family reported worsening lethargy over the past couple days with some confusion, nausea, and vomiting.  There was no blood or dark vomitus noted.  Patient had complained to a family member of some abdominal pain yesterday.  She required blood transfusion during the recent hospitalization but family has not noted any melena or hematochezia.  SNF was recommended during the recent hospitalization, but patient's family elected to bring her back home.  [facility] ED Course: Upon arrival to the ED, patient is found to be febrile to 38 C, saturating 90% on room air, tachypneic, tachycardic, and with blood pressure 100/75.  EKG features atrial fibrillation with rate 147 and QTc interval 517 ms.  Chest x-ray was negative for acute finding.  Chemistry panel is notable for an albumin of 1.7.  CBC features a new leukocytosis to 16,400, normocytic anemia with hemoglobin of 8.1, down from 12.9 earlier this month, and a thrombocytopenia which has improved to 106,000.  Lactic acid was elevated to 3.21.  Noncontrast head CT is negative for acute intracranial abnormality.  CT of the chest and abdomen and pelvis was obtained and most notable for bilateral patchy groundglass nodular densities concerning for multifocal pneumonia.  CT findings also include interstitial edema and anasarca.  Blood and urine culture was ordered, influenza PCR was ordered, patient was given 2 L lactated Ringer's, 100 mg IV Solu-Cortef, vancomycin, cefepime, and  Flagyl in the ED.  Heart rate improved some, blood pressure remained stable, and transferred to [facility] was arranged for further evaluation and management.  Action/Plan: Will follow for progression of care and clinical status. Will follow for case management needs none present at this time.  Expected Discharge Date:                  Expected Discharge Plan:     In-House Referral:     Discharge planning Services     Post Acute Care Choice:    Choice offered to:     DME Arranged:    DME Agency:     HH Arranged:    HH Agency:     Status of Service:     If discussed at [employer], dates discussed:    Additional Comments:  Golda Acre, RN 10/11/2018, 8:17 AM

## 2018-10-12 ENCOUNTER — Inpatient Hospital Stay (HOSPITAL_COMMUNITY): Payer: Medicaid Other

## 2018-10-12 DIAGNOSIS — I5033 Acute on chronic diastolic (congestive) heart failure: Secondary | ICD-10-CM

## 2018-10-12 LAB — COMPREHENSIVE METABOLIC PANEL
ALBUMIN: 2.1 g/dL — AB (ref 3.5–5.0)
ALT: 15 U/L (ref 0–44)
ANION GAP: 9 (ref 5–15)
AST: 22 U/L (ref 15–41)
Alkaline Phosphatase: 78 U/L (ref 38–126)
BILIRUBIN TOTAL: 0.3 mg/dL (ref 0.3–1.2)
BUN: 17 mg/dL (ref 6–20)
CHLORIDE: 114 mmol/L — AB (ref 98–111)
CO2: 20 mmol/L — ABNORMAL LOW (ref 22–32)
Calcium: 7.6 mg/dL — ABNORMAL LOW (ref 8.9–10.3)
Creatinine, Ser: 0.86 mg/dL (ref 0.44–1.00)
GFR calc Af Amer: >60 mL/min (ref >60)
Glucose, Bld: 257 mg/dL — ABNORMAL HIGH (ref 70–99)
POTASSIUM: 3.2 mmol/L — AB (ref 3.5–5.1)
Sodium: 143 mmol/L (ref 135–145)
TOTAL PROTEIN: 4.8 g/dL — AB (ref 6.5–8.1)

## 2018-10-12 LAB — TYPE AND SCREEN
ABO/RH(D): O POS
Antibody Screen: NEGATIVE
Unit division: 0

## 2018-10-12 LAB — CBC WITH DIFFERENTIAL/PLATELET
Abs Immature Granulocytes: 0.03 10*3/uL (ref 0.00–0.07)
BASOS ABS: 0 10*3/uL (ref 0.0–0.1)
BASOS PCT: 0 %
EOS ABS: 0 10*3/uL (ref 0.0–0.5)
EOS PCT: 0 %
HEMATOCRIT: 27.4 % — AB (ref 36.0–46.0)
Hemoglobin: 9.2 g/dL — ABNORMAL LOW (ref 12.0–15.0)
IMMATURE GRANULOCYTES: 1 %
Lymphocytes Relative: 17 %
Lymphs Abs: 1 10*3/uL (ref 0.7–4.0)
MCH: 27.5 pg (ref 26.0–34.0)
MCHC: 33.6 g/dL (ref 30.0–36.0)
MCV: 82 fL (ref 80.0–100.0)
Monocytes Absolute: 0.3 10*3/uL (ref 0.1–1.0)
Monocytes Relative: 5 %
NEUTROS PCT: 77 %
NRBC: 0 % (ref 0.0–0.2)
Neutro Abs: 4.7 10*3/uL (ref 1.7–7.7)
PLATELETS: 68 10*3/uL — AB (ref 150–400)
RBC: 3.34 MIL/uL — ABNORMAL LOW (ref 3.87–5.11)
RDW: 18.4 % — AB (ref 11.5–15.5)
WBC: 6 10*3/uL (ref 4.0–10.5)

## 2018-10-12 LAB — GLUCOSE, CAPILLARY
GLUCOSE-CAPILLARY: 147 mg/dL — AB (ref 70–99)
GLUCOSE-CAPILLARY: 197 mg/dL — AB (ref 70–99)
GLUCOSE-CAPILLARY: 208 mg/dL — AB (ref 70–99)

## 2018-10-12 LAB — BPAM RBC
BLOOD PRODUCT EXPIRATION DATE: 201912202359
ISSUE DATE / TIME: 201911221201
UNIT TYPE AND RH: 5100

## 2018-10-12 LAB — MAGNESIUM: MAGNESIUM: 1.6 mg/dL — AB (ref 1.7–2.4)

## 2018-10-12 LAB — PHOSPHORUS: Phosphorus: 2.9 mg/dL (ref 2.5–4.6)

## 2018-10-12 MED ORDER — FUROSEMIDE 10 MG/ML IJ SOLN
20.0000 mg | Freq: Once | INTRAMUSCULAR | Status: AC
  Administered 2018-10-12: 20 mg via INTRAVENOUS
  Filled 2018-10-12: qty 2

## 2018-10-12 MED ORDER — METHYLPREDNISOLONE SODIUM SUCC 40 MG IJ SOLR
40.0000 mg | INTRAMUSCULAR | Status: DC
  Administered 2018-10-13: 40 mg via INTRAVENOUS
  Filled 2018-10-12: qty 1

## 2018-10-12 MED ORDER — PANTOPRAZOLE SODIUM 40 MG PO TBEC
40.0000 mg | DELAYED_RELEASE_TABLET | Freq: Two times a day (BID) | ORAL | Status: DC
  Administered 2018-10-12 – 2018-10-14 (×5): 40 mg via ORAL
  Filled 2018-10-12 (×5): qty 1

## 2018-10-12 MED ORDER — ATORVASTATIN CALCIUM 10 MG PO TABS
10.0000 mg | ORAL_TABLET | Freq: Every day | ORAL | Status: DC
  Administered 2018-10-12 – 2018-10-13 (×2): 10 mg via ORAL
  Filled 2018-10-12 (×2): qty 1

## 2018-10-12 MED ORDER — POTASSIUM CHLORIDE CRYS ER 20 MEQ PO TBCR
40.0000 meq | EXTENDED_RELEASE_TABLET | Freq: Two times a day (BID) | ORAL | Status: AC
  Administered 2018-10-12 (×2): 40 meq via ORAL
  Filled 2018-10-12 (×3): qty 2

## 2018-10-12 MED ORDER — HYDRALAZINE HCL 20 MG/ML IJ SOLN
10.0000 mg | Freq: Four times a day (QID) | INTRAMUSCULAR | Status: DC | PRN
  Administered 2018-10-14: 10 mg via INTRAVENOUS
  Filled 2018-10-12: qty 1

## 2018-10-12 MED ORDER — MAGNESIUM SULFATE 2 GM/50ML IV SOLN
2.0000 g | Freq: Once | INTRAVENOUS | Status: AC
  Administered 2018-10-12: 2 g via INTRAVENOUS
  Filled 2018-10-12: qty 50

## 2018-10-12 MED ORDER — ADULT MULTIVITAMIN W/MINERALS CH
1.0000 | ORAL_TABLET | Freq: Every day | ORAL | Status: DC
  Administered 2018-10-12 – 2018-10-14 (×3): 1 via ORAL
  Filled 2018-10-12 (×3): qty 1

## 2018-10-12 MED ORDER — VALPROIC ACID 250 MG PO CAPS
500.0000 mg | ORAL_CAPSULE | Freq: Three times a day (TID) | ORAL | Status: DC
  Administered 2018-10-12 – 2018-10-14 (×7): 500 mg via ORAL
  Filled 2018-10-12 (×10): qty 2

## 2018-10-12 MED ORDER — OXYCODONE HCL 5 MG PO TABS: 20.0000 mg | ORAL_TABLET | Freq: Four times a day (QID) | ORAL | Status: DC | PRN

## 2018-10-12 NOTE — Progress Notes (Signed)
Called report to [employer], will transfer pt in the bed on telemetry. Will also call family to let them know new room number

## 2018-10-12 NOTE — Progress Notes (Signed)
PROGRESS NOTE    Amber [name]  OZH:[id number] DOB: [birth year] DOA: 10/10/2018 PCP: [name], MD   Brief Narrative:  HPI per Dr. Odie Sera on 10/10/18 Amber [name] is a 62 y.o. female with medical history significant for chronic atrial fibrillation no longer on anticoagulation due to GI bleeding, chronic pain, chronic steroid use, seizure disorder, arthritis, history of CVA, and recent admission with septic shock secondary to E. coli UTI, now presenting with lethargy, nausea, and vomiting.  Patient's family reported worsening lethargy over the past couple days with some confusion, nausea, and vomiting.  There was no blood or dark vomitus noted.  Patient had complained to a family member of some abdominal pain yesterday.  She required blood transfusion during the recent hospitalization but family has not noted any melena or hematochezia.  SNF was recommended during the recent hospitalization, but patient's family elected to bring her back home.  [facility] ED Course: Upon arrival to the ED, patient is found to be febrile to 38 C, saturating 90% on room air, tachypneic, tachycardic, and with blood pressure 100/75.  EKG features atrial fibrillation with rate 147 and QTc interval 517 ms.  Chest x-ray was negative for acute finding.  Chemistry panel is notable for an albumin of 1.7.  CBC features a new leukocytosis to 16,400, normocytic anemia with hemoglobin of 8.1, down from 12.9 earlier this month, and a thrombocytopenia which has improved to 106,000.  Lactic acid was elevated to 3.21.  Noncontrast head CT is negative for acute intracranial abnormality.  CT of the chest and abdomen and pelvis was obtained and most notable for bilateral patchy groundglass nodular densities concerning for multifocal pneumonia.  CT findings also include interstitial edema and anasarca.  Blood and urine culture was ordered, influenza PCR was ordered, patient was given 2 L lactated Ringer's, 100 mg IV  Solu-Cortef, vancomycin, cefepime, and Flagyl in the ED.  Heart rate improved some, blood pressure remained stable, and transferred to [facility] was arranged for further evaluation and management.  **She underwent swallow evaluation and speech saw her and placed on a dysphagia 3 diet with thin liquids.  Antibiotic regimen has been adjusted and she will be placed on just IV Zosyn.  We will continue monitor patient's respiratory status carefully and she has improved.   Assessment & Plan:   Principal Problem:   Sepsis due to pneumonia [facility]) Active Problems:   History of seizure   Diabetes mellitus type 2, controlled (HCC)   Chronic back pain   History of CVA with residual deficit   Chronic anemia   Paroxysmal atrial fibrillation with RVR (HCC)   Acute metabolic encephalopathy   Failure to thrive in adult   Steroid-dependent COPD (HCC)   Anasarca   Pressure injury of skin  Sepsis secondary to multifocal pneumonia; ?aspiration  - Presents with lethargy, N/V, and confusion - Found to have fever, tachycardia, new leukocytosis, elevated lactate, and CT findings concerning for multifocal PNA  -Given her confusion and recent N/V, there is concern for aspiration  -Blood and urine cultures collected in ED, 30 cc/kg LR bolus given, and she was started on broad-spectrum empiric antibiotics; started on maintenance IV fluids with D5W normal saline rate of 75 mLs per hour for 12 hours - P remains stable, HR normalized, and lactate improving  trended down and lactate is now went from 4.05 and is now 1.8 -SLP consulted for further evaluations and recommending dysphagia 3 diet -Antibiotics have been adjusted and we  have stopped IV cefepime IV vancomycin and IV metronidazole and have started patient on IV Zosyn and will de-escalate further to IV Unasyn -No Longer requiring O2 -CXR showed "Cardiac enlargement with mild vascular congestion progressing since previous study. Developing atelectasis  or infiltration in the left lung base." -Because of mild vascular congestion was given IV Lasix 40 mg this AM  -Continue monitor and repeat chest x-ray in the a.m.  Normocytic anemia; Thrombocytopenia    - Hgb is 8.1 on admission, down from 12.9 earlier this month. Hb/Hct now stable after transfusion of 1 unit of pRBC's and Hb/Hct was 9.2/27.4 -Platelet Count is now 68,000 -Continue to monitor for signs and symptoms of bleeding  -Repeat CBC in AM    Paroxysymal Atrial Fibrillation  -Had a fib with rapid rate in ED, now back in sinus  -CHADS-VASc is at least 25 (age, gender, CVA x2, DM)  - No longer anticoagulated d/t GIB  - Further review shows that she is no longer on Toprol and will need to verify this  Anasarca  - Preserved EF on echo earlier this month  - Likely driven by low albumin -We will get nutritionist involved but currently was given IV fluid hydration with D5 normal saline rate of 75 mL's for 12 hours and stopped -Started on IV Lasix and given 40 mg this AM   HIstory of CVA  -No acute findings on head CT  -Resumed Statin this AM   Type II DM  - Check CBG's and use SSI with Novolog while in hospital   -Adjusted medication regimen to sensitive NovoLog sliding scale every 4 while she was n.p.o and will change back to AC/HS  Steroid-dependence  - Patient takes 15 mg prednisone daily for unclear indication  - No hypotension or electrolyte abnormalities  - Treated with 100 mg IV hydrocortisone in ED  - Continue steroid with Solu-Medrol while NPO to avoid crisis  continue currently at 40 mg every 12h for now and weaned to 40 mg q12h   Acute encephalopathy, improved - No acute findings on CT head  - Likely related to infection and improvement expected with continued treatment as above   -Continue monitor closely  Chronic pain  - Continue pain-control; Is on IV Morphine 2-4 mg q4hprn and will change to po Home Regimen   Failure to thrive; protein-calorie  malnutrition  - Appears very frail, albumin only 1.7  - Family declined SNF earlier this month  -Get nutritionist involved now that she can swallow -Treated with Ensure Enlive p.o. twice daily  Seizure disorder  - Continue valproic acid and changed to po now -Medication review shows that she is no longer taking levetiracetam  Thrush -Placed on Fluconazole 200 mg p.o. once and then 100 mg p.o. daily afterwards along with next session swish and swallow  GERD -Continued with pantoprazole IV and change back to p.o. This AM  Acute on Chronic Diastolic CHF -Mildly volume overload -Stopped IVF and started Lasix today  -CXR this AM showed "Cardiac enlargement with mild vascular congestion progressing since previous study. Developing atelectasis or infiltration in the left lung base." -Strict I's and O's, Daily Weights  -Patient is +3.812 Liters since admission and weight is about the same -Not on Any Lasix at home -Re-evaluate Need for Lasix in AM after today's dose -Continue to Monitor Volume Status Carefully  Hypokalemia -Patient's potassium this morning is 3.2 -Replete with p.o. potassium chloride 40 mEq twice daily x2 doses -Continue monitor and replete as necessary -  Repeat CMP in a.m.  Hypomagnesemia -Patient magnesium level this morning is 1.6 -Replete with IV mag sulfate 2 g -Continue monitor replete as necessary -Repeat mag level in the a.m.  Accelerated Hypertension -Given IV Lasix -Started on Hydralazine prn  DVT prophylaxis: SCDs Code Status: Full code Family Communication: Family present at bedside Disposition Plan: Will get PT evaluation; Transfer to Telemetry today   Consultants:   SLP  Procedures:   None  Antimicrobials:  Anti-infectives (From admission, onward)   Start     Dose/Rate Route Frequency Ordered Stop   10/12/18 1000  fluconazole (DIFLUCAN) tablet 100 mg     100 mg Oral Daily 10/11/18 1550     10/11/18 1600  fluconazole (DIFLUCAN) tablet  200 mg     200 mg Oral  Once 10/11/18 1550 10/11/18 1733   10/11/18 1500  ceFEPIme (MAXIPIME) 2 g in sodium chloride 0.9 % 100 mL IVPB  Status:  Discontinued     2 g 200 mL/hr over 30 Minutes Intravenous Every 24 hours 10/10/18 1452 10/11/18 0901   10/11/18 1500  vancomycin (VANCOCIN) IVPB 1000 mg/200 mL premix  Status:  Discontinued     1,000 mg 200 mL/hr over 60 Minutes Intravenous Every 24 hours 10/10/18 1453 10/11/18 0901   10/11/18 1000  piperacillin-tazobactam (ZOSYN) IVPB 3.375 g     3.375 g 12.5 mL/hr over 240 Minutes Intravenous Every 8 hours 10/11/18 0901     10/11/18 0200  metroNIDAZOLE (FLAGYL) IVPB 500 mg  Status:  Discontinued     500 mg 100 mL/hr over 60 Minutes Intravenous Every 8 hours 10/10/18 2327 10/11/18 0901   10/10/18 1515  vancomycin (VANCOCIN) 1-5 GM/200ML-% IVPB    Note to Pharmacy:  Daylene Posey   : cabinet override      10/10/18 1515 10/11/18 0329   10/10/18 1445  ceFEPIme (MAXIPIME) 2 g in sodium chloride 0.9 % 100 mL IVPB     2 g 200 mL/hr over 30 Minutes Intravenous  Once 10/10/18 1433 10/10/18 1529   10/10/18 1445  metroNIDAZOLE (FLAGYL) IVPB 500 mg  Status:  Discontinued     500 mg 100 mL/hr over 60 Minutes Intravenous Every 8 hours 10/10/18 1433 10/10/18 2312   10/10/18 1445  vancomycin (VANCOCIN) IVPB 1000 mg/200 mL premix     1,000 mg 200 mL/hr over 60 Minutes Intravenous  Once 10/10/18 1433 10/10/18 1711   10/10/18 1443  ceFEPIme (MAXIPIME) 2 g injection    Note to Pharmacy:  Daylene Posey   : cabinet override      10/10/18 1443 10/11/18 0259     Subjective: Seen and examined at bedside and was eating and had no active complaints.  No chest pain, lightheadedness or dizziness.  States shortness of breath is improved.  No nausea or vomiting.  No other concerns or complaints at this time.  Objective: Vitals:   10/12/18 0900 10/12/18 1000 10/12/18 1100 10/12/18 1200  BP: (!) 203/116 (!) 156/101 115/73 137/88  Pulse: (!) 108 (!) 103 70 95    Resp: 19 12 16 17   Temp:      TempSrc:      SpO2: 100% 100% 100% 100%  Weight:      Height:        Intake/Output Summary (Last 24 hours) at 10/12/2018 1450 Last data filed at 10/12/2018 1200 Gross per 24 hour  Intake 2379.34 ml  Output 1850 ml  Net 529.34 ml   Filed Weights   10/11/18 0319 10/12/18 0700  Weight:  45 kg 45 kg    Examination: Physical Exam:  Constitutional: Thin African-American female currently in no acute distress is calm and eating breakfast. Eyes: Sclera anicteric. Lids and conjunctive are normal ENMT: External ears and nose appear normal. Neck: Supple with no JVD Respiratory: Diminished to auscultation bilaterally with some mild coarse breath sounds.  Had some rhonchi on the left but this is improving.  No accessory muscle usage and is no longer wearing supplemental oxygen via nasal cannula  Cardiovascular: Regular rate and rhythm.  No appreciable murmurs, rubs, gallops.  Has 1-2+ lower extremity edema noted Abdomen: Soft, mildly tender.  Nondistended.  Bowel sounds present in 4 quadrants GU: Deferred Musculoskeletal: No contractures or cyanosis. Skin: Skin is warm and dry no appreciable rashes or lesions limited skin evaluation Neurologic: Cranial nerves II through XII grossly intact no no appreciable focal deficits.  Does have some dysarthria but is doing well. Psychiatric: Normal judgment and insight.  Patient is awake and alert and oriented x3.  Not as anxious today.  Data Reviewed: I have personally reviewed following labs and imaging studies  CBC: Recent Labs  Lab 10/10/18 1312 10/11/18 0202 10/11/18 2013 10/12/18 0309  WBC 16.4* 10.6* 6.1 6.0  NEUTROABS 12.3* 9.6*  --  4.7  HGB 8.1* 7.0* 9.2* 9.2*  HCT 26.7* 22.6* 28.7* 27.4*  MCV 89.6 88.3 87.8 82.0  PLT 106* 86* 79* 68*   Basic Metabolic Panel: Recent Labs  Lab 10/10/18 1312 10/11/18 0202 10/12/18 0309  NA 142 145 143  K 4.0 3.8 3.2*  CL 113* 116* 114*  CO2 22 21* 20*   GLUCOSE 193* 88 257*  BUN 16 16 17   CREATININE 0.87 0.72 0.86  CALCIUM 7.7* 7.8* 7.6*  MG  --   --  1.6*  PHOS  --   --  2.9   GFR: Estimated Creatinine Clearance: 49.4 mL/min (by C-G formula based on SCr of 0.86 mg/dL). Liver Function Tests: Recent Labs  Lab 10/10/18 1312 10/12/18 0309  AST 23 22  ALT 18 15  ALKPHOS 100 78  BILITOT 0.5 0.3  PROT 5.2* 4.8*  ALBUMIN 1.7* 2.1*   No results for input(s): LIPASE, AMYLASE in the last 168 hours. No results for input(s): AMMONIA in the last 168 hours. Coagulation Profile: No results for input(s): INR, PROTIME in the last 168 hours. Cardiac Enzymes: No results for input(s): CKTOTAL, CKMB, CKMBINDEX, TROPONINI in the last 168 hours. BNP (last 3 results) No results for input(s): PROBNP in the last 8760 hours. HbA1C: No results for input(s): HGBA1C in the last 72 hours. CBG: Recent Labs  Lab 10/11/18 1257 10/11/18 1745 10/11/18 2138 10/12/18 0736 10/12/18 1234  GLUCAP 170* 232* 316* 147* 197*   Lipid Profile: No results for input(s): CHOL, HDL, LDLCALC, TRIG, CHOLHDL, LDLDIRECT in the last 72 hours. Thyroid Function Tests: No results for input(s): TSH, T4TOTAL, FREET4, T3FREE, THYROIDAB in the last 72 hours. Anemia Panel: No results for input(s): VITAMINB12, FOLATE, FERRITIN, TIBC, IRON, RETICCTPCT in the last 72 hours. Sepsis Labs: Recent Labs  Lab 10/10/18 1643 10/10/18 1713 10/10/18 2328 10/11/18 0202  LATICACIDVEN 4.05* 3.49* 2.4* 1.8    Recent Results (from the past 240 hour(s))  Urine culture     Status: None   Collection Time: 10/10/18  2:05 PM  Result Value Ref Range Status   Specimen Description   Final    URINE, CATHETERIZED Performed at [facility], [address] [place], [place]    Special  Requests   Final    NONE Performed at [facility], [address] [place], [place] [address]    Culture   Final    NO GROWTH Performed at [facility], [address], [place], [place] [address]    Report Status 10/11/2018 FINAL  Final  Culture, blood (routine x 2)     Status: None (Preliminary result)   Collection Time: 10/10/18  2:05 PM  Result Value Ref Range Status   Specimen Description   Final    BLOOD LEFT NECK Performed at [facility], [address] [place], [place] [address]    Special Requests   Final    BOTTLES DRAWN AEROBIC AND ANAEROBIC Blood Culture adequate volume JUGULAR VEIN Performed at [facility], [address] [place], [place] [address]    Culture   Final    NO GROWTH 2 DAYS Performed at [facility], [address] [place], [place] [address]    Report Status PENDING  Incomplete  MRSA PCR Screening     Status: None   Collection Time: 10/10/18 10:09 PM  Result Value Ref Range Status   MRSA by PCR NEGATIVE NEGATIVE Final    Comment:        The GeneXpert MRSA Assay (FDA approved for NASAL specimens only), is one component of a comprehensive MRSA colonization surveillance program. It is not intended to diagnose MRSA infection nor to guide or monitor treatment for MRSA infections. Performed at [facility], [address], [place], [place] [address]     RN Pressure Injury Documentation and I am in agreement with the nurse's assessment:  Pressure Injury 10/10/18 Stage II -  Partial thickness loss of dermis presenting as a shallow open ulcer with a red, pink wound bed without slough. (Active)  10/10/18 2230   Location: Groin  Location Orientation: Right  Staging: Stage II -  Partial thickness loss of dermis presenting as a shallow open ulcer with a red, pink wound bed without slough.  Wound Description (Comments):   Present on Admission: Yes     Pressure Injury 10/10/18 Stage II -  Partial thickness loss of dermis presenting as a shallow open ulcer with a red, pink wound bed without slough. (Active)  10/10/18 2230   Location: Groin  Location Orientation: Left   Staging: Stage II -  Partial thickness loss of dermis presenting as a shallow open ulcer with a red, pink wound bed without slough.  Wound Description (Comments):   Present on Admission: Yes     Pressure Injury 10/10/18 Stage II -  Partial thickness loss of dermis presenting as a shallow open ulcer with a red, pink wound bed without slough. (Active)  10/10/18 2230   Location: Sacrum  Location Orientation: Mid  Staging: Stage II -  Partial thickness loss of dermis presenting as a shallow open ulcer with a red, pink wound bed without slough.  Wound Description (Comments):   Present on Admission: Yes     Pressure Injury 10/10/18 Stage II -  Partial thickness loss of dermis presenting as a shallow open ulcer with a red, pink wound bed without slough. (Active)  10/10/18 2230   Location: Buttocks  Location Orientation: Right  Staging: Stage II -  Partial thickness loss of dermis presenting as a shallow open ulcer with a red, pink wound bed without slough.  Wound Description (Comments):   Present on Admission: Yes  Pressure Injury 10/10/18 Stage II -  Partial thickness loss of dermis presenting as a shallow open ulcer with a red, pink wound bed without slough. (Active)  10/10/18 2230   Location: Buttocks  Location Orientation: Left  Staging: Stage II -  Partial thickness loss of dermis presenting as a shallow open ulcer with a red, pink wound bed without slough.  Wound Description (Comments):   Present on Admission: Yes      Radiology Studies: Ct Abdomen Pelvis Wo Contrast  Result Date: 10/10/2018 CLINICAL DATA:  Abdominal pain IV contrast allergy.  No oral contrast per ordering MD EXAM: CT CHEST, ABDOMEN AND PELVIS WITHOUT CONTRAST TECHNIQUE: Multidetector CT imaging of the chest, abdomen and pelvis was performed following the standard protocol without IV contrast. COMPARISON:  Chest CT 09/09/2018 FINDINGS: CT CHEST FINDINGS Cardiovascular: Significant streak artifact of the  mediastinum. No pericardial effusion. No gross abnormality of the heart or great vessels. Mediastinum/Nodes: No axillary or supraclavicular adenopathy. No mediastinal hilar adenopathy. Lungs/Pleura: Bilateral perihilar patchy ground-glass densities these ground-glass densities or more nodular in shape in the inferior lingula measuring up to 12 mm No pleural fluid. No pneumothorax. Interlobular septal thickening at the lung bases. Musculoskeletal: Posterior thoracic fusion. Severe degenerative arthropathy at the glenohumeral joint. There is a well-circumscribed lytic lesion the proximal femur humerus which is enlarging over comparison exams from 2018. Lesion is seen on axial image 12/12 and on the CT scout measuring approximately 12 mm. CT ABDOMEN AND PELVIS FINDINGS Hepatobiliary: No focal hepatic lesion on noncontrast exam. Pancreas: Chronic calcifications throughout the pancreas. Spleen: Normal spleen Adrenals/urinary tract: Adrenal glands and kidneys are normal. The ureters and bladder normal. Stomach/Bowel: Stomach, small bowel, appendix, and cecum are normal. The colon and rectosigmoid colon are normal. Vascular/Lymphatic: Abdominal aorta is normal caliber with atherosclerotic calcification. There is no retroperitoneal or periportal lymphadenopathy. No pelvic lymphadenopathy. Reproductive: Uterus and ovaries normal. Other: No free fluid.  Anasarca of the soft tissues. Musculoskeletal: No aggressive osseous lesion IMPRESSION: Chest Impression: 1. Bilateral patchy ground-glass nodular densities concerning for multifocal pulmonary infection. 2. Mild interlobular septal thickening in the lung bases suggests interstitial edema 3. No lymphadenopathy. 4. 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 5. Enlarging lytic lesion in the proximal RIGHT humerus differential including infection, erosive arthropathy or neoplasm. Consider dedicated films of the RIGHT shoulder. Abdomen / Pelvis Impression: 1. Anasarca  of the soft tissues. 2.  Aortic Atherosclerosis (ICD10-I70.0). Electronically Signed   By: Genevive Bi M.D.   On: 10/10/2018 16:14   Ct Head Wo Contrast  Result Date: 10/10/2018 CLINICAL DATA:  62 year old female with lethargy, vomiting, sepsis. EXAM: CT HEAD WITHOUT CONTRAST TECHNIQUE: Contiguous axial images were obtained from the base of the skull through the vertex without intravenous contrast. COMPARISON:  Brain MRI 09/29/2018. Head CT 09/26/2018, and earlier. FINDINGS: Brain: Right MCA territory encephalomalacia with Patchy and confluent bilateral cerebral white matter hypodensity, and bilateral thalamic heterogeneity. Superimposed bilateral small chronic cerebellar infarcts, better demonstrated by MRI. No midline shift, ventriculomegaly, mass effect, evidence of mass lesion, intracranial hemorrhage or evidence of cortically based acute infarction. Vascular: Calcified atherosclerosis at the skull base. No suspicious intracranial vascular hyperdensity. Skull: Osteopenia.  No acute osseous abnormality identified. Sinuses/Orbits: Visualized paranasal sinuses and mastoids are stable and well pneumatized. Other: No acute orbit or scalp soft tissue finding. IMPRESSION: 1. No acute intracranial abnormality. 2. Stable appearance of advanced chronic ischemic disease. Electronically Signed   By: Odessa Fleming M.D.   On: 10/10/2018 15:56  Ct Chest Wo Contrast  Result Date: 10/10/2018 CLINICAL DATA:  Abdominal pain IV contrast allergy.  No oral contrast per ordering MD EXAM: CT CHEST, ABDOMEN AND PELVIS WITHOUT CONTRAST TECHNIQUE: Multidetector CT imaging of the chest, abdomen and pelvis was performed following the standard protocol without IV contrast. COMPARISON:  Chest CT 09/09/2018 FINDINGS: CT CHEST FINDINGS Cardiovascular: Significant streak artifact of the mediastinum. No pericardial effusion. No gross abnormality of the heart or great vessels. Mediastinum/Nodes: No axillary or supraclavicular adenopathy.  No mediastinal hilar adenopathy. Lungs/Pleura: Bilateral perihilar patchy ground-glass densities these ground-glass densities or more nodular in shape in the inferior lingula measuring up to 12 mm No pleural fluid. No pneumothorax. Interlobular septal thickening at the lung bases. Musculoskeletal: Posterior thoracic fusion. Severe degenerative arthropathy at the glenohumeral joint. There is a well-circumscribed lytic lesion the proximal femur humerus which is enlarging over comparison exams from 2018. Lesion is seen on axial image 12/12 and on the CT scout measuring approximately 12 mm. CT ABDOMEN AND PELVIS FINDINGS Hepatobiliary: No focal hepatic lesion on noncontrast exam. Pancreas: Chronic calcifications throughout the pancreas. Spleen: Normal spleen Adrenals/urinary tract: Adrenal glands and kidneys are normal. The ureters and bladder normal. Stomach/Bowel: Stomach, small bowel, appendix, and cecum are normal. The colon and rectosigmoid colon are normal. Vascular/Lymphatic: Abdominal aorta is normal caliber with atherosclerotic calcification. There is no retroperitoneal or periportal lymphadenopathy. No pelvic lymphadenopathy. Reproductive: Uterus and ovaries normal. Other: No free fluid.  Anasarca of the soft tissues. Musculoskeletal: No aggressive osseous lesion IMPRESSION: Chest Impression: 1. Bilateral patchy ground-glass nodular densities concerning for multifocal pulmonary infection. 2. Mild interlobular septal thickening in the lung bases suggests interstitial edema 3. No lymphadenopathy. 4. 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 5. Enlarging lytic lesion in the proximal RIGHT humerus differential including infection, erosive arthropathy or neoplasm. Consider dedicated films of the RIGHT shoulder. Abdomen / Pelvis Impression: 1. Anasarca of the soft tissues. 2.  Aortic Atherosclerosis (ICD10-I70.0). Electronically Signed   By: Genevive Bi M.D.   On: 10/10/2018 16:14   Dg Chest Port  1 View  Result Date: 10/12/2018 CLINICAL DATA:  Shortness of breath EXAM: PORTABLE CHEST 1 VIEW COMPARISON:  10/10/2018 FINDINGS: Postoperative changes in the cervicothoracic spine. Shallow inspiration. Cardiac enlargement with mild vascular congestion, progressing since previous study. Atelectasis or infiltration in the left lung base behind the heart. No blunting of costophrenic angles. No pneumothorax. Old fracture deformities of the right shoulder and right upper ribs. Degenerative changes in the left shoulder. IMPRESSION: Cardiac enlargement with mild vascular congestion progressing since previous study. Developing atelectasis or infiltration in the left lung base. Electronically Signed   By: Burman Nieves M.D.   On: 10/12/2018 06:38   Dg Shoulder Right Port  Result Date: 10/10/2018 CLINICAL DATA:  Right shoulder arthropathy. EXAM: PORTABLE RIGHT SHOULDER COMPARISON:  Chest CT 09/09/2018 and 10/10/2018 and prior radiographs 11/18/2017. 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 Marked erosive changes involving the humeral head and neck and glenoid. Remote right rib fractures are noted.  The right lung is clear. 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 Electronically Signed   By: Rudie Meyer M.D.   On: 10/10/2018 19:24   Scheduled Meds: . allopurinol  300 mg Oral Daily  . atorvastatin  10 mg Oral q1800  . chlorhexidine  15 mL Mouth Rinse BID  . feeding supplement (ENSURE ENLIVE)  237 mL Oral BID BM  .  fluconazole  100 mg Oral Daily  . insulin aspart  0-5 Units Subcutaneous QHS  . insulin aspart  0-9 Units Subcutaneous TID WC  . mouth rinse  15 mL Mouth Rinse q12n4p  . [START ON 10/13/2018] methylPREDNISolone (SOLU-MEDROL) injection  40 mg Intravenous Q24H  . multivitamin with minerals  1 tablet Oral Daily  . nystatin  5 mL Oral QID  . pantoprazole   40 mg Oral BID  . potassium chloride  40 mEq Oral BID  . sodium chloride flush  3 mL Intravenous Q12H  . sodium chloride flush  3 mL Intravenous Q12H  . valproic acid  500 mg Oral TID   Continuous Infusions: . sodium chloride    . piperacillin-tazobactam (ZOSYN)  IV 12.5 mL/hr at 10/12/18 1200    LOS: 2 days   Merlene Laughter, DO [facility] PAGER is on AMION  If 7PM-7AM, please contact night-coverage [email or web address] Password [email or web address] 10/12/2018, 2:50 PM

## 2018-10-12 NOTE — Progress Notes (Signed)
Pt unable to swallow potassium, pt kept them in her mouth and was unable to swallow. New potassium pills pulled from pyxis and crushed, pt was able to take them in applesauce.

## 2018-10-13 DIAGNOSIS — E1121 Type 2 diabetes mellitus with diabetic nephropathy: Secondary | ICD-10-CM

## 2018-10-13 DIAGNOSIS — J189 Pneumonia, unspecified organism: Secondary | ICD-10-CM

## 2018-10-13 DIAGNOSIS — D649 Anemia, unspecified: Secondary | ICD-10-CM

## 2018-10-13 DIAGNOSIS — R627 Adult failure to thrive: Secondary | ICD-10-CM

## 2018-10-13 DIAGNOSIS — I480 Paroxysmal atrial fibrillation: Secondary | ICD-10-CM

## 2018-10-13 DIAGNOSIS — I6930 Unspecified sequelae of cerebral infarction: Secondary | ICD-10-CM

## 2018-10-13 DIAGNOSIS — Z87898 Personal history of other specified conditions: Secondary | ICD-10-CM

## 2018-10-13 DIAGNOSIS — R601 Generalized edema: Secondary | ICD-10-CM

## 2018-10-13 DIAGNOSIS — M5440 Lumbago with sciatica, unspecified side: Secondary | ICD-10-CM

## 2018-10-13 DIAGNOSIS — A419 Sepsis, unspecified organism: Principal | ICD-10-CM

## 2018-10-13 DIAGNOSIS — G9341 Metabolic encephalopathy: Secondary | ICD-10-CM

## 2018-10-13 DIAGNOSIS — G8929 Other chronic pain: Secondary | ICD-10-CM

## 2018-10-13 LAB — CBC
HCT: 36.6 % (ref 36.0–46.0)
Hemoglobin: 11.3 g/dL — ABNORMAL LOW (ref 12.0–15.0)
MCH: 27.1 pg (ref 26.0–34.0)
MCHC: 30.9 g/dL (ref 30.0–36.0)
MCV: 87.8 fL (ref 80.0–100.0)
PLATELETS: 79 10*3/uL — AB (ref 150–400)
RBC: 4.17 MIL/uL (ref 3.87–5.11)
RDW: 19 % — AB (ref 11.5–15.5)
WBC: 6.1 10*3/uL (ref 4.0–10.5)
nRBC: 0 % (ref 0.0–0.2)

## 2018-10-13 LAB — BASIC METABOLIC PANEL
Anion gap: 8 (ref 5–15)
BUN: 14 mg/dL (ref 6–20)
CALCIUM: 8 mg/dL — AB (ref 8.9–10.3)
CO2: 28 mmol/L (ref 22–32)
Chloride: 106 mmol/L (ref 98–111)
Creatinine, Ser: 0.76 mg/dL (ref 0.44–1.00)
GFR calc Af Amer: >60 mL/min (ref >60)
GLUCOSE: 95 mg/dL (ref 70–99)
POTASSIUM: 3.7 mmol/L (ref 3.5–5.1)
SODIUM: 142 mmol/L (ref 135–145)

## 2018-10-13 LAB — GLUCOSE, CAPILLARY
GLUCOSE-CAPILLARY: 94 mg/dL (ref 70–99)
Glucose-Capillary: 220 mg/dL — ABNORMAL HIGH (ref 70–99)
Glucose-Capillary: 306 mg/dL — ABNORMAL HIGH (ref 70–99)
Glucose-Capillary: 176 mg/dL — ABNORMAL HIGH (ref 70–99)

## 2018-10-13 MED ORDER — FUROSEMIDE 20 MG PO TABS
20.0000 mg | ORAL_TABLET | Freq: Every day | ORAL | Status: DC
  Administered 2018-10-13 – 2018-10-14 (×2): 20 mg via ORAL
  Filled 2018-10-13 (×2): qty 1

## 2018-10-13 MED ORDER — PREDNISONE 20 MG PO TABS
40.0000 mg | ORAL_TABLET | Freq: Every day | ORAL | Status: DC
  Administered 2018-10-14: 40 mg via ORAL
  Filled 2018-10-13: qty 2

## 2018-10-13 MED ORDER — POTASSIUM CHLORIDE CRYS ER 20 MEQ PO TBCR
40.0000 meq | EXTENDED_RELEASE_TABLET | Freq: Once | ORAL | Status: AC
  Administered 2018-10-13: 40 meq via ORAL
  Filled 2018-10-13: qty 2

## 2018-10-13 NOTE — Evaluation (Signed)
Occupational Therapy Evaluation Patient Details Name: Amber [name] MRN: [id number] DOB: 06-17-1956 Today's Date: 10/13/2018    History of Present Illness 62 y.o. female admitted 11/21 with lethargy, Nausea and vomiting,, recently in hospital for hypotension and AMS , PNA and Sepsis.Marland Kitchen  PMH includes: CVA, HTN, Anemia, GERD, Seizures, , ACS, Frequent Falls, Back surgery.     Clinical Impression   Pt admitted with lethargy. Pt currently with functional limitations due to the deficits listed below (see OT Problem List).  Pt will benefit from skilled OT to increase their safety and independence with ADL and functional mobility for ADL to facilitate discharge to venue listed below.      Follow Up Recommendations  SNF;Supervision/Assistance - 24 hour          Precautions / Restrictions Precautions Precautions: Fall Precaution Comments: incontinence      Mobility Bed Mobility Overal bed mobility: Needs Assistance Bed Mobility: Supine to Sit     Supine to sit: Max assist     General bed mobility comments: pt in chair  Transfers Overall transfer level: Needs assistance Equipment used: Rolling walker (2 wheeled) Transfers: Sit to/from Stand Sit to Stand: Max assist;+2 safety/equipment Stand pivot transfers: Total assist;+2 safety/equipment       General transfer comment: patient having large BM upon standing. Assisted to recliner with patient scissoring legs. manually had to uncross  feet in order to sit down.    Balance Overall balance assessment: Needs assistance Sitting-balance support: Feet supported Sitting balance-Leahy Scale: Fair     Standing balance support: Bilateral upper extremity supported;During functional activity Standing balance-Leahy Scale: Poor                             ADL either performed or assessed with clinical judgement   ADL Overall ADL's : Needs assistance/impaired Eating/Feeding: Maximal assistance;Sitting Eating/Feeding  Details (indicate cue type and reason): ket to pt being successful is positioning of tray and items with pts reach due to decreased AROM of BUE Grooming: Moderate assistance;Sitting                                 General ADL Comments: limited ADL eval as self feeding took increased time                  Pertinent Vitals/Pain Pain Score: 0-No pain Faces Pain Scale: No hurt     Hand Dominance Right   Extremity/Trunk Assessment Upper Extremity Assessment Upper Extremity Assessment: LUE deficits/detail;RUE deficits/detail;Generalized weakness    Pt with limited AROM with BUE and limited PROM.  Pt states arms have been like this a long time and her family feeds her but she is able to hold a cup and some finger foods.  Discussed built up fork in which OT will try with pt next session.  Pt with appox 45 degrees AROM with BUE ( shoulder) and limited grip both hands  Lower Extremity Assessment Lower Extremity Assessment: RLE deficits/detail;LLE deficits/detail RLE Deficits / Details: decreased control to advance the leg, when taking a step the legs scissored LLE Deficits / Details: decreased ability to advance the leg.       Communication Communication Communication: Expressive difficulties   Cognition Arousal/Alertness: Awake/alert   Overall Cognitive Status: No family/caregiver present to determine baseline cognitive functioning Area of Impairment: Following commands;Awareness;Problem solving  Orientation Level: Time;Situation     Following Commands: Follows one step commands with increased time   Awareness: Emergent Problem Solving: Slow processing;Difficulty sequencing;Requires verbal cues                Home Living Family/patient expects to be discharged to:: Private residence Living Arrangements: Other relatives(granddaughter) Available Help at Discharge: Family;Available PRN/intermittently;Personal care attendant Type of  Home: Apartment Home Access: Stairs to enter Entrance Stairs-Number of Steps: 5 Entrance Stairs-Rails: None Home Layout: One level     Bathroom Shower/Tub: Tub/shower unit;Curtain         Home Equipment: Tub bench;Walker - 4 wheels;Bedside commode   Additional Comments: lives with grandson and son aroudn the corner, has aide come 7x a week, has help from son with ADL and IADls, ambulates with RW- info taken from previous encounter.      Prior Functioning/Environment Level of Independence: Needs assistance  Gait / Transfers Assistance Needed: pt reports she ambulates with rollator ADL's / Homemaking Assistance Needed: Per pt report, personal care assistant assists with ADL tasks 7 days/week, 3 hours/day            OT Problem List: Decreased strength;Decreased activity tolerance;Impaired balance (sitting and/or standing);Decreased cognition;Decreased knowledge of use of DME or AE      OT Treatment/Interventions: Self-care/ADL training;DME and/or AE instruction;Cognitive remediation/compensation;Patient/family education;Balance training;Therapeutic activities    OT Goals(Current goals can be found in the care plan section) Acute Rehab OT Goals Patient Stated Goal: to go home   OT Frequency: Min 2X/week   Barriers to D/C:            Co-evaluation              AM-PAC OT "6 Clicks" Daily Activity     Outcome Measure Help from another person eating meals?: A Lot Help from another person taking care of personal grooming?: A Lot Help from another person toileting, which includes using toliet, bedpan, or urinal?: Total Help from another person bathing (including washing, rinsing, drying)?: A Lot Help from another person to put on and taking off regular upper body clothing?: A Lot Help from another person to put on and taking off regular lower body clothing?: Total 6 Click Score: 10   End of Session Nurse Communication: Mobility status  Activity Tolerance: Patient  limited by fatigue Patient left: with call bell/phone within reach;in chair;with chair alarm set  OT Visit Diagnosis: Unsteadiness on feet (R26.81);Other abnormalities of gait and mobility (R26.89);Muscle weakness (generalized) (M62.81);Other symptoms and signs involving cognitive function                Time: 1250-1310 OT Time Calculation (min): 20 min Charges:  OT General Charges $OT Visit: 1 Visit OT Evaluation $OT Eval Moderate Complexity: 1 Mod  Lise Auer, OT Acute Rehabilitation Services [phone] Office- [phone]     Andrya Roppolo, Karin Golden D 10/13/2018, 2:35 PM

## 2018-10-13 NOTE — NC FL2 (Signed)
[place] MEDICAID FL2 LEVEL OF CARE SCREENING TOOL     IDENTIFICATION  Patient Name: Amber [name] Birthdate: December 26, 1955 Sex: female Admission Date (Current Location): 10/10/2018  [facility] and [place] Number:  Producer, television/film/video and Address:  [facility],  [address] [place], [place] [address]      Provider Number: [id number]  Attending Physician Name and Address:  Edsel Petrin, DO  Relative Name and Phone Number:  Carmenlita Mallory, [phone]    Current Level of Care: Hospital Recommended Level of Care: Skilled Nursing Facility Prior Approval Number:    Date Approved/Denied:   PASRR Number: [id number]  Discharge Plan:      Current Diagnoses: Patient Active Problem List   Diagnosis Date Noted  . Pressure injury of skin 10/11/2018  . Sepsis due to pneumonia (HCC) 10/10/2018  . Failure to thrive in adult 10/10/2018  . 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 Anasarca 10/10/2018  . HCAP (healthcare-associated pneumonia) 09/24/2018  . Acute metabolic encephalopathy 05/09/2018  . Metabolic acidosis 05/09/2018  . Mild persistent asthma 05/09/2018  . 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 Moderate malnutrition (HCC) 05/07/2018  . Paroxysmal atrial fibrillation with RVR (HCC) 05/07/2018  . Nausea & vomiting 05/06/2018  . Chronic anemia 05/06/2018  . PFO (patent foramen ovale) 02/15/2017  . History of CVA with residual deficit 02/15/2017  . Drug-induced hyperglycemia 02/15/2017  . 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 Sepsis (HCC) 02/29/2016  . Abdominal pain 11/07/2015  . Constipation 11/07/2015  . Hypomagnesemia 08/25/2015  . Diabetes mellitus type 2, controlled (HCC) 07/27/2015  . Chronic back pain   . Frequent falls   . Essential hypertension   . Pulmonary hypertension (HCC)   . Gout   . Microcytic anemia 01/26/2015  . History of seizure 01/26/2015    Orientation RESPIRATION BLADDER Height & Weight     Self  Normal  Incontinent Weight: 94 lb 9.2 oz (42.9 kg) Height:  5' (152.4 cm)  BEHAVIORAL SYMPTOMS/MOOD NEUROLOGICAL BOWEL NUTRITION STATUS      Continent Diet(dsy 3)  AMBULATORY STATUS COMMUNICATION OF NEEDS Skin   Extensive Assist Verbally                         Personal Care Assistance Level of Assistance  Bathing, Feeding, Dressing Bathing Assistance: Maximum assistance Feeding assistance: Limited assistance Dressing Assistance: Maximum assistance     Functional Limitations Info  Sight, Hearing, Speech Sight Info: Adequate Hearing Info: Adequate Speech Info: Adequate    SPECIAL CARE FACTORS FREQUENCY  OT (By licensed OT), PT (By licensed PT)     PT Frequency: 5x wk OT Frequency: 5x wk            Contractures Contractures Info: Not present    Additional Factors Info  Code Status, Allergies Code Status Info: Full Code Allergies Info: IVP DYE IODINATED DIAGNOSTIC AGENTS, METRIZAMIDE           Current Medications (10/13/2018):  This is the current hospital active medication list Current Facility-Administered Medications  Medication Dose Route Frequency Provider Last Rate Last Dose  . 0.9 %  sodium chloride infusion  250 mL Intravenous PRN Opyd, Lavone Neri, MD      . acetaminophen (TYLENOL) tablet 650 mg  650 mg Oral Q6H PRN Opyd, Lavone Neri, MD       Or  . acetaminophen (TYLENOL) suppository 650 mg  650 mg Rectal Q6H PRN Opyd, Lavone Neri, MD      .  allopurinol (ZYLOPRIM) tablet 300 mg  300 mg Oral Daily Opyd, Lavone Neri, MD   300 mg at 10/13/18 1013  . atorvastatin (LIPITOR) tablet 10 mg  10 mg Oral q1800 Marguerita Merles [place], DO   10 mg at 10/12/18 1748  . chlorhexidine (PERIDEX) 0.12 % solution 15 mL  15 mL Mouth Rinse BID Opyd, Lavone Neri, MD   15 mL at 10/12/18 2200  . feeding supplement (ENSURE ENLIVE) (ENSURE ENLIVE) liquid 237 mL  237 mL Oral BID BM Opyd, Lavone Neri, MD   237 mL at 10/13/18 1330  . fluconazole (DIFLUCAN) tablet 100 mg  100 mg Oral Daily Marguerita Merles  [place], DO   100 mg at 10/13/18 1013  . furosemide (LASIX) tablet 20 mg  20 mg Oral Daily Edsel Petrin, DO   20 mg at 10/13/18 1328  . hydrALAZINE (APRESOLINE) injection 10 mg  10 mg Intravenous Q6H PRN Sheikh, Omair Latif, DO      . insulin aspart (novoLOG) injection 0-5 Units  0-5 Units Subcutaneous QHS Briscoe Deutscher, MD   2 Units at 10/12/18 2110  . insulin aspart (novoLOG) injection 0-9 Units  0-9 Units Subcutaneous TID WC Opyd, Lavone Neri, MD   3 Units at 10/13/18 1321  . levalbuterol (XOPENEX) nebulizer solution 0.63 mg  0.63 mg Nebulization Q6H PRN Sheikh, Omair Latif, DO      . MEDLINE mouth rinse  15 mL Mouth Rinse q12n4p Opyd, Lavone Neri, MD   15 mL at 10/11/18 1801  . morphine 2 MG/ML injection 2-4 mg  2-4 mg Intravenous Q4H PRN Opyd, Lavone Neri, MD   2 mg at 10/10/18 2347  . multivitamin with minerals tablet 1 tablet  1 tablet Oral Daily Marguerita Merles [place], DO   1 tablet at 10/13/18 1013  . nystatin (MYCOSTATIN) 100000 UNIT/ML suspension 500,000 Units  5 mL Oral QID Marguerita Merles [place], DO   500,000 Units at 10/12/18 2200  . ondansetron (ZOFRAN) tablet 4 mg  4 mg Oral Q6H PRN Opyd, Lavone Neri, MD       Or  . ondansetron (ZOFRAN) injection 4 mg  4 mg Intravenous Q6H PRN Opyd, Lavone Neri, MD      . oxyCODONE (Oxy IR/ROXICODONE) immediate release tablet 20 mg  20 mg Oral Q6H PRN Sheikh, Omair Latif, DO      . pantoprazole (PROTONIX) EC tablet 40 mg  40 mg Oral BID Sheikh, Omair Latif, DO   40 mg at 10/13/18 1013  . piperacillin-tazobactam (ZOSYN) IVPB 3.375 g  3.375 g Intravenous Q8H Sheikh, Omair Latif, DO 12.5 mL/hr at 10/13/18 1017 3.375 g at 10/13/18 1017  . [START ON 10/14/2018] predniSONE (DELTASONE) tablet 40 mg  40 mg Oral Q breakfast Mikhail, Maryann, DO      . sodium chloride flush (NS) 0.9 % injection 3 mL  3 mL Intravenous Q12H Opyd, Lavone Neri, MD   3 mL at 10/12/18 2201  . sodium chloride flush (NS) 0.9 % injection 3 mL  3 mL Intravenous Q12H Opyd, Lavone Neri, MD   3 mL at  10/12/18 2201  . sodium chloride flush (NS) 0.9 % injection 3 mL  3 mL Intravenous PRN Opyd, Lavone Neri, MD      . valproic acid (DEPAKENE) 250 MG capsule 500 mg  500 mg Oral TID Marland Mcalpine, Omair Latif, DO   500 mg at 10/13/18 1010     Discharge Medications: Please see discharge summary for a list of discharge medications.  Relevant Imaging Results:  Relevant Lab Results:   Additional Information SSN:  [id number]  Althea Charon, LCSW

## 2018-10-13 NOTE — Evaluation (Signed)
Physical Therapy Evaluation Patient Details Name: Amber [name] MRN: [id number] DOB: Jun 02, 1956 Today's Date: 10/13/2018   History of Present Illness  62 y.o. female admitted 11/21 with lethargy, Nausea and vomiting,, recently in hospital for hypotension and AMS , PNA and Sepsis.Marland Kitchen  PMH includes: CVA, HTN, Anemia, GERD, Seizures, , ACS, Frequent Falls, Back surgery.    Clinical Impression  The patient required extensive assistance  For mobility. Patient incontinent of large BM during mobility. No family present to discuss DC plan. 10/13/2018 Pt admitted with above diagnosis. Pt currently with functional limitations due to the deficits listed below (see PT Problem List).  Pt will benefit from skilled PT to increase their independence and safety with mobility to allow discharge to the venue listed below.       Follow Up Recommendations SNF    Equipment Recommendations  Hospital bed;Wheelchair (measurements PT);Wheelchair cushion (measurements PT);Other (comment)    Recommendations for Other Services       Precautions / Restrictions Precautions Precautions: Fall Precaution Comments: incontinence      Mobility  Bed Mobility Overal bed mobility: Needs Assistance Bed Mobility: Supine to Sit     Supine to sit: Max assist     General bed mobility comments: assist legs and trunk  Transfers Overall transfer level: Needs assistance Equipment used: Rolling walker (2 wheeled) Transfers: Sit to/from [employer] Sit to Stand: Max assist;+2 safety/equipment Stand pivot transfers: Total assist;+2 safety/equipment       General transfer comment: patient having large BM upon standing. Assisted to recliner with patient scissoring legs. manually had to uncross  feet in order to sit down.  Ambulation/Gait                Stairs            Wheelchair Mobility    Modified Rankin (Stroke Patients Only)       Balance Overall balance assessment: Needs  assistance Sitting-balance support: Feet supported Sitting balance-Leahy Scale: Fair     Standing balance support: Bilateral upper extremity supported;During functional activity Standing balance-Leahy Scale: Poor                               Pertinent Vitals/Pain Faces Pain Scale: No hurt    Home Living Family/patient expects to be discharged to:: Private residence Living Arrangements: Other relatives(granddaughter) Available Help at Discharge: Family;Available PRN/intermittently;Personal care attendant Type of Home: Apartment Home Access: Stairs to enter Entrance Stairs-Rails: None Entrance Stairs-Number of Steps: 5 Home Layout: One level Home Equipment: Tub bench;Walker - 4 wheels;Bedside commode Additional Comments: lives with grandson and son aroudn the corner, has aide come 7x a week, has help from son with ADL and IADls, ambulates with RW- info taken from previous encounter.    Prior Function Level of Independence: Needs assistance   Gait / Transfers Assistance Needed: pt reports she ambulates with rollator  ADL's / Homemaking Assistance Needed: Per pt report, personal care assistant assists with ADL tasks 7 days/week, 3 hours/day        Hand Dominance   Dominant Hand: Right    Extremity/Trunk Assessment   Upper Extremity Assessment Upper Extremity Assessment: Defer to OT evaluation    Lower Extremity Assessment Lower Extremity Assessment: RLE deficits/detail;LLE deficits/detail RLE Deficits / Details: decreased control to advance the leg, when taking a step the legs scissored LLE Deficits / Details: decreased ability to advance the leg.       Communication  Communication: Expressive difficulties  Cognition Arousal/Alertness: Awake/alert   Overall Cognitive Status: No family/caregiver present to determine baseline cognitive functioning Area of Impairment: Following commands;Awareness;Problem solving                 Orientation  Level: Time;Situation     Following Commands: Follows one step commands with increased time   Awareness: Emergent Problem Solving: Slow processing;Difficulty sequencing;Requires verbal cues        General Comments      Exercises     Assessment/Plan    PT Assessment Patient needs continued PT services  PT Problem List Decreased activity tolerance;Decreased knowledge of use of DME;Decreased safety awareness;Decreased balance;Decreased mobility       PT Treatment Interventions Gait training;DME instruction;Functional mobility training;Therapeutic activities;Patient/family education    PT Goals (Current goals can be found in the Care Plan section)  Acute Rehab PT Goals Patient Stated Goal: to go home  PT Goal Formulation: Patient unable to participate in goal setting Time For Goal Achievement: 10/27/18 Potential to Achieve Goals: Good    Frequency Min 2X/week   Barriers to discharge        Co-evaluation               AM-PAC PT "6 Clicks" Mobility  Outcome Measure Help needed turning from your back to your side while in a flat bed without using bedrails?: Total Help needed moving from lying on your back to sitting on the side of a flat bed without using bedrails?: Total Help needed moving to and from a bed to a chair (including a wheelchair)?: Total Help needed standing up from a chair using your arms (e.g., wheelchair or bedside chair)?: Total Help needed to walk in hospital room?: Total Help needed climbing 3-5 steps with a railing? : Total 6 Click Score: 6    End of Session Equipment Utilized During Treatment: Gait belt Activity Tolerance: Patient limited by fatigue Patient left: in chair;with call bell/phone within reach;with chair alarm set;with nursing/sitter in room Nurse Communication: Mobility status PT Visit Diagnosis: Unsteadiness on feet (R26.81);Muscle weakness (generalized) (M62.81)    Time: 2542-7062 PT Time Calculation (min) (ACUTE ONLY): 50  min   Charges:   PT Evaluation $PT Eval Moderate Complexity: 1 Mod PT Treatments $Therapeutic Activity: 8-22 mins $Self Care/Home Management: 8-22        Blanchard Kelch PT Acute Rehabilitation Services Pager [phone] Office [phone]   Rada Hay 10/13/2018, 1:08 PM

## 2018-10-13 NOTE — Progress Notes (Signed)
Initial Nutrition Assessment  DOCUMENTATION CODES:   Underweight  INTERVENTION:   Continue Ensure Enlive po BID, each supplement provides 350 kcal and 20 grams of protein  NUTRITION DIAGNOSIS:   Inadequate oral intake related to decreased appetite as evidenced by per patient/family report.  GOAL:   Patient will meet greater than or equal to 90% of their needs  MONITOR:   PO intake, Supplement acceptance, Labs, Weight trends, I & O's, Skin  REASON FOR ASSESSMENT:   Consult Assessment of nutrition requirement/status  ASSESSMENT:   62 y.o. female with medical history significant for chronic atrial fibrillation no longer on anticoagulation due to GI bleeding, chronic pain, chronic steroid use, seizure disorder, arthritis, history of CVA, and recent admission with septic shock secondary to E. coli UTI, now presenting with lethargy, nausea, and vomiting.  Patient's family reported worsening lethargy over the past couple days with some confusion, nausea, and vomiting.    Patient with aphasia and is difficult to gather nutrition information at this time. SLP has evaluated patient 11/22, pt with mild dysphagia, moderate aspiration risk and identified thrush.  PO intakes: 50% of meals on 11/23. Patient with multiple pressure injuries. Ensure supplements to continue given increased needs.  Per weight records, pt's weight has fluctuated. Most likely d/t fluid.  Medications: Multivitamin with minerals daily Labs reviewed: CBGs: [id number]   NUTRITION - FOCUSED PHYSICAL EXAM:  Suspect edema is masking extent of depletions.    Most Recent Value  Orbital Region  No depletion  Upper Arm Region  Moderate depletion  Thoracic and Lumbar Region  Unable to assess  Buccal Region  No depletion  Temple Region  Mild depletion  Clavicle Bone Region  Mild depletion  Clavicle and Acromion Bone Region  Mild depletion  Scapular Bone Region  Unable to assess  Dorsal Hand  Mild depletion  Patellar  Region  Mild depletion  Anterior Thigh Region  Mild depletion  Posterior Calf Region  Mild depletion  Edema (RD Assessment)  Severe       Diet Order:   Diet Order            DIET DYS 3 Room service appropriate? Yes with Assist; Fluid consistency: Thin  Diet effective now              EDUCATION NEEDS:   No education needs have been identified at this time  Skin:  Skin Assessment: Skin Integrity Issues: Skin Integrity Issues:: Stage II Stage II: right groin, left groin, sacrum, right buttocks, left buttocks  Last BM:  11/23  Height:   Ht Readings from Last 1 Encounters:  10/10/18 5' (1.524 m)    Weight:   Wt Readings from Last 1 Encounters:  10/12/18 42.9 kg    Ideal Body Weight:  45.5 kg  BMI:  Body mass index is 18.47 kg/m.  Estimated Nutritional Needs:   Kcal:  1300-1500  Protein:  60-70g  Fluid:  1.5L/day   Tilda Franco, MS, RD, LDN [facility] Inpatient Clinical Dietitian Pager: [phone] After Hours Pager: [phone]

## 2018-10-13 NOTE — Progress Notes (Signed)
PROGRESS NOTE    Amber [name]  JHE:[id number] DOB: Jan 28, 1956 DOA: 10/10/2018 PCP: [name], MD   Brief Narrative:  HPI on 10/10/2018 by Dr. Odie Sera Lawrence Amber [name] is a 62 y.o. female with medical history significant for chronic atrial fibrillation no longer on anticoagulation due to GI bleeding, chronic pain, chronic steroid use, seizure disorder, arthritis, history of CVA, and recent admission with septic shock secondary to E. coli UTI, now presenting with lethargy, nausea, and vomiting.  Patient's family reported worsening lethargy over the past couple days with some confusion, nausea, and vomiting.  There was no blood or dark vomitus noted.  Patient had complained to a family member of some abdominal pain yesterday.  She required blood transfusion during the recent hospitalization but family has not noted any melena or hematochezia.  SNF was recommended during the recent hospitalization, but patient's family elected to bring her back home.  Interim history Patient admitted with sepsis secondary to multifocal pneumonia, suspected to be aspiration. Assessment & Plan   Sepsis secondary to multifocal pneumonia, possible aspiration -Patient presented with lethargy, nausea and vomiting confusion.  Found to have fever, tachycardia, leukocytosis, elevated lactic acid level -CT findings concerning for multifocal pneumonia -Blood cultures show no growth  -Urine culture shows no growth  -Placed on IV fluids -Speech therapy consulted and recommended dysphagia 3 diet -Was noted to have vascular congestion and given IV Lasix on 10/12/2018 -Was on vancomycin and cefepime, transitioned to Zosyn  Acute on chronic diastolic CHF -Was on IV fluids, however this was discontinued -Echocardiogram 09/25/2018 showed EF of 65 to 70%, grade 1 diastolic dysfunction -Patient was started on IV Lasix -Monitor intake and output, daily weights  Normocytic anemia, thrombocytopenia -Hemoglobin was 8.1 on  admission, patient was given 1 unit PRBC -Hemoglobin currently 11.3 -Platelet count currently 79 -Continue to monitor CBC  Paroxysmal atrial fibrillation -CHADSVASC 5 -No longer on anticoagulation due to GI bleed -Review of chart shows that she is no longer on metoprolol  Anasarca -Echocardiogram as above -Suspect secondary to hypoalbuminemia -Nutrition consulted -Was given IV Lasix  History of CVA -CTA unremarkable, continue statin  Diabetes mellitus, type II -Continue insulin sliding scale and CBG monitoring  Steroid dependence -Patient takes 15 mg of prednisone daily, unclear indication -Currently no hypotension or electrolyte abnormalities.  Was given hydrocortisone in the emergency department. -Currently on Solu-Medrol, transition to oral  Acute metabolic encephalopathy -Suspect secondary to infection -As above, CT head unremarkable  Chronic pain -Continue pain control, home regimen oxycodone, morphine as needed  Failure to thrive/protein calorie malnutrition -Nutrition consulted, continue supplements  Seizure disorder -Continue valproic acid  Thrush -Continue Diflucan  GERD -Continue PPI Hypokalemia/hypomagnesemia  Hypertension -As above, appears to not be on metoprolol.  Was given IV Lasix and placed on hydralazine PRN -BP more controlled today  DVT Prophylaxis  SCDs  Code Status: Full  Family Communication: None at bedside  Disposition Plan: Admitted. Dispo to home in 1-2 days pending improvement   Consultants None  Procedures  None  Antibiotics   Anti-infectives (From admission, onward)   Start     Dose/Rate Route Frequency Ordered Stop   10/12/18 1000  fluconazole (DIFLUCAN) tablet 100 mg     100 mg Oral Daily 10/11/18 1550     10/11/18 1600  fluconazole (DIFLUCAN) tablet 200 mg     200 mg Oral  Once 10/11/18 1550 10/11/18 1733   10/11/18 1500  ceFEPIme (MAXIPIME) 2 g in sodium chloride 0.9 % 100 mL IVPB  Status:  Discontinued     2  g 200 mL/hr over 30 Minutes Intravenous Every 24 hours 10/10/18 1452 10/11/18 0901   10/11/18 1500  vancomycin (VANCOCIN) IVPB 1000 mg/200 mL premix  Status:  Discontinued     1,000 mg 200 mL/hr over 60 Minutes Intravenous Every 24 hours 10/10/18 1453 10/11/18 0901   10/11/18 1000  piperacillin-tazobactam (ZOSYN) IVPB 3.375 g     3.375 g 12.5 mL/hr over 240 Minutes Intravenous Every 8 hours 10/11/18 0901     10/11/18 0200  metroNIDAZOLE (FLAGYL) IVPB 500 mg  Status:  Discontinued     500 mg 100 mL/hr over 60 Minutes Intravenous Every 8 hours 10/10/18 2327 10/11/18 0901   10/10/18 1515  vancomycin (VANCOCIN) 1-5 GM/200ML-% IVPB    Note to Pharmacy:  Daylene Posey   : cabinet override      10/10/18 1515 10/11/18 0329   10/10/18 1445  ceFEPIme (MAXIPIME) 2 g in sodium chloride 0.9 % 100 mL IVPB     2 g 200 mL/hr over 30 Minutes Intravenous  Once 10/10/18 1433 10/10/18 1529   10/10/18 1445  metroNIDAZOLE (FLAGYL) IVPB 500 mg  Status:  Discontinued     500 mg 100 mL/hr over 60 Minutes Intravenous Every 8 hours 10/10/18 1433 10/10/18 2312   10/10/18 1445  vancomycin (VANCOCIN) IVPB 1000 mg/200 mL premix     1,000 mg 200 mL/hr over 60 Minutes Intravenous  Once 10/10/18 1433 10/10/18 1711   10/10/18 1443  ceFEPIme (MAXIPIME) 2 g injection    Note to Pharmacy:  Daylene Posey   : cabinet override      10/10/18 1443 10/11/18 0259      Subjective:   Geralyn Flash seen and examined today.  Patient has no complaints this morning.  Denies current chest pain, shortness of breath, abdominal pain, nausea or vomiting, diarrhea or constipation.  Objective:   Vitals:   10/12/18 1900 10/12/18 2016 10/13/18 0410 10/13/18 1008  BP:  (!) 142/99 (!) 143/98 (!) 139/93  Pulse:  83 70 81  Resp:  18 18   Temp:  98.2 F (36.8 C) 98.1 F (36.7 C)   TempSrc:  Oral Oral   SpO2:  100% 93%   Weight: 42.9 kg     Height:        Intake/Output Summary (Last 24 hours) at 10/13/2018 1253 Last data filed  at 10/13/2018 0410 Gross per 24 hour  Intake 332.27 ml  Output 1300 ml  Net -967.73 ml   Filed Weights   10/11/18 0319 10/12/18 0700 10/12/18 1900  Weight: 45 kg 45 kg 42.9 kg    Exam  General: Well developed, chronically ill appearing, NAD  HEENT: NCAT, mucous membranes moist.   Neck: Supple  Cardiovascular: S1 S2 auscultated, RRR  Respiratory: Diminished breath sounds however clear.  No wheezing.  Abdomen: Soft, nontender, nondistended, + bowel sounds  Extremities: warm dry without cyanosis clubbing or edema  Neuro: AAOx3, nonfocal  Psych: Appropriate mood and affect   Data Reviewed: I have personally reviewed following labs and imaging studies  CBC: Recent Labs  Lab 10/10/18 1312 10/11/18 0202 10/11/18 2013 10/12/18 0309 10/13/18 0752  WBC 16.4* 10.6* 6.1 6.0 6.1  NEUTROABS 12.3* 9.6*  --  4.7  --   HGB 8.1* 7.0* 9.2* 9.2* 11.3*  HCT 26.7* 22.6* 28.7* 27.4* 36.6  MCV 89.6 88.3 87.8 82.0 87.8  PLT 106* 86* 79* 68* 79*   Basic Metabolic Panel: Recent Labs  Lab 10/10/18 1312 10/11/18  0202 10/12/18 0309 10/13/18 0752  NA 142 145 143 142  K 4.0 3.8 3.2* 3.7  CL 113* 116* 114* 106  CO2 22 21* 20* 28  GLUCOSE 193* 88 257* 95  BUN 16 16 17 14   CREATININE 0.87 0.72 0.86 0.76  CALCIUM 7.7* 7.8* 7.6* 8.0*  MG  --   --  1.6*  --   PHOS  --   --  2.9  --    GFR: Estimated Creatinine Clearance: 50.6 mL/min (by C-G formula based on SCr of 0.76 mg/dL). Liver Function Tests: Recent Labs  Lab 10/10/18 1312 10/12/18 0309  AST 23 22  ALT 18 15  ALKPHOS 100 78  BILITOT 0.5 0.3  PROT 5.2* 4.8*  ALBUMIN 1.7* 2.1*   No results for input(s): LIPASE, AMYLASE in the last 168 hours. No results for input(s): AMMONIA in the last 168 hours. Coagulation Profile: No results for input(s): INR, PROTIME in the last 168 hours. Cardiac Enzymes: No results for input(s): CKTOTAL, CKMB, CKMBINDEX, TROPONINI in the last 168 hours. BNP (last 3 results) No results for  input(s): PROBNP in the last 8760 hours. HbA1C: No results for input(s): HGBA1C in the last 72 hours. CBG: Recent Labs  Lab 10/12/18 0736 10/12/18 1234 10/12/18 2052 10/13/18 0733 10/13/18 1158  GLUCAP 147* 197* 208* 94 220*   Lipid Profile: No results for input(s): CHOL, HDL, LDLCALC, TRIG, CHOLHDL, LDLDIRECT in the last 72 hours. Thyroid Function Tests: No results for input(s): TSH, T4TOTAL, FREET4, T3FREE, THYROIDAB in the last 72 hours. Anemia Panel: No results for input(s): VITAMINB12, FOLATE, FERRITIN, TIBC, IRON, RETICCTPCT in the last 72 hours. Urine analysis:    Component Value Date/Time   COLORURINE YELLOW 10/10/2018 1406   APPEARANCEUR CLEAR 10/10/2018 1406   LABSPEC 1.015 10/10/2018 1406   PHURINE 6.5 10/10/2018 1406   GLUCOSEU NEGATIVE 10/10/2018 1406   HGBUR NEGATIVE 10/10/2018 1406   BILIRUBINUR NEGATIVE 10/10/2018 1406   KETONESUR NEGATIVE 10/10/2018 1406   PROTEINUR NEGATIVE 10/10/2018 1406   UROBILINOGEN 1.0 07/27/2015 1052   NITRITE NEGATIVE 10/10/2018 1406   LEUKOCYTESUR NEGATIVE 10/10/2018 1406   Sepsis Labs: @LABRCNTIP (procalcitonin:4,lacticidven:4)  ) Recent Results (from the past 240 hour(s))  Urine culture     Status: None   Collection Time: 10/10/18  2:05 PM  Result Value Ref Range Status   Specimen Description   Final    URINE, CATHETERIZED Performed at [facility], [address] [place], [place] [address]    Special Requests   Final    NONE Performed at [facility], [address] [place], [place] [address]    Culture   Final    NO GROWTH Performed at [facility], [address], [place], [place] [address]    Report Status 10/11/2018 FINAL  Final  Culture, blood (routine x 2)     Status: None (Preliminary result)   Collection Time: 10/10/18  2:05 PM  Result Value Ref Range Status   Specimen Description   Final    BLOOD LEFT NECK Performed at [facility], [address] [place], [place] [address]    Special Requests   Final    BOTTLES DRAWN AEROBIC AND ANAEROBIC Blood Culture adequate volume JUGULAR VEIN Performed at [facility], [address] [place], [place] [address]    Culture   Final    NO GROWTH 2 DAYS Performed at [facility], [address]  [place], [place] [address]    Report Status PENDING  Incomplete  MRSA PCR Screening     Status: None   Collection Time: 10/10/18 10:09 PM  Result Value Ref Range Status   MRSA by PCR NEGATIVE NEGATIVE Final    Comment:        The GeneXpert MRSA Assay (FDA approved for NASAL specimens only), is one component of a comprehensive MRSA colonization surveillance program. It is not intended to diagnose MRSA infection nor to guide or monitor treatment for MRSA infections. Performed at [facility], [address] [place], [place] [address]       Radiology Studies: Dg Chest Port 1 View  Result Date: 10/12/2018 CLINICAL DATA:  Shortness of breath EXAM: PORTABLE CHEST 1 VIEW COMPARISON:  10/10/2018 FINDINGS: Postoperative changes in the cervicothoracic spine. Shallow inspiration. Cardiac enlargement with mild vascular congestion, progressing since previous study. Atelectasis or infiltration in the left lung base behind the heart. No blunting of costophrenic angles. No pneumothorax. Old fracture deformities of the right shoulder and right upper ribs. Degenerative changes in the left shoulder. IMPRESSION: Cardiac enlargement with mild vascular congestion progressing since previous study. Developing atelectasis or infiltration in the left lung base. Electronically Signed   By: Burman Nieves M.D.   On: 10/12/2018 06:38     Scheduled Meds: . allopurinol  300 mg Oral Daily  . atorvastatin  10 mg Oral q1800  . chlorhexidine  15 mL Mouth Rinse BID  . feeding supplement (ENSURE ENLIVE)  237 mL Oral BID BM  . fluconazole  100 mg Oral Daily  . insulin aspart  0-5 Units Subcutaneous  QHS  . insulin aspart  0-9 Units Subcutaneous TID WC  . mouth rinse  15 mL Mouth Rinse q12n4p  . methylPREDNISolone (SOLU-MEDROL) injection  40 mg Intravenous Q24H  . multivitamin with minerals  1 tablet Oral Daily  . nystatin  5 mL Oral QID  . pantoprazole  40 mg Oral BID  . sodium chloride flush  3 mL Intravenous Q12H  . sodium chloride flush  3 mL Intravenous Q12H  . valproic acid  500 mg Oral TID   Continuous Infusions: . sodium chloride    . piperacillin-tazobactam (ZOSYN)  IV 3.375 g (10/13/18 1017)     LOS: 3 days   Time Spent in minutes   30 minutes  Lasharn Bufkin D.O. on 10/13/2018 at 12:53 PM  Between 7am to 7pm - Please see pager noted on [email or web address]  After 7pm go to [email or web address]  And look for the night coverage person covering for me after hours  [facility] Office  [phone]

## 2018-10-14 DIAGNOSIS — J449 Chronic obstructive pulmonary disease, unspecified: Secondary | ICD-10-CM

## 2018-10-14 LAB — BASIC METABOLIC PANEL
ANION GAP: 9 (ref 5–15)
BUN: 15 mg/dL (ref 6–20)
CO2: 29 mmol/L (ref 22–32)
Calcium: 8.1 mg/dL — ABNORMAL LOW (ref 8.9–10.3)
Chloride: 102 mmol/L (ref 98–111)
Creatinine, Ser: 0.79 mg/dL (ref 0.44–1.00)
GFR calc Af Amer: >60 mL/min (ref >60)
GFR calc non Af Amer: >60 mL/min (ref >60)
GLUCOSE: 220 mg/dL — AB (ref 70–99)
POTASSIUM: 3.8 mmol/L (ref 3.5–5.1)
Sodium: 140 mmol/L (ref 135–145)

## 2018-10-14 LAB — GLUCOSE, CAPILLARY
GLUCOSE-CAPILLARY: 209 mg/dL — AB (ref 70–99)
Glucose-Capillary: 196 mg/dL — ABNORMAL HIGH (ref 70–99)
Glucose-Capillary: 219 mg/dL — ABNORMAL HIGH (ref 70–99)

## 2018-10-14 LAB — MAGNESIUM: Magnesium: 1.8 mg/dL (ref 1.7–2.4)

## 2018-10-14 MED ORDER — FUROSEMIDE 20 MG PO TABS: 20.0000 mg | ORAL_TABLET | Freq: Every day | ORAL | 0 refills | Status: DC

## 2018-10-14 MED ORDER — MAGNESIUM SULFATE 2 GM/50ML IV SOLN
2.0000 g | Freq: Once | INTRAVENOUS | Status: AC
  Administered 2018-10-14: 2 g via INTRAVENOUS
  Filled 2018-10-14: qty 50

## 2018-10-14 MED ORDER — PREDNISONE 20 MG PO TABS: ORAL_TABLET | ORAL | 0 refills | Status: DC

## 2018-10-14 MED ORDER — FLUCONAZOLE 100 MG PO TABS: 100.0000 mg | ORAL_TABLET | Freq: Every day | ORAL | 0 refills | Status: DC

## 2018-10-14 MED ORDER — INSULIN ASPART 100 UNIT/ML ~~LOC~~ SOLN: 0.0000 [IU] | Freq: Three times a day (TID) | SUBCUTANEOUS | Status: DC

## 2018-10-14 MED ORDER — AMOXICILLIN-POT CLAVULANATE 875-125 MG PO TABS: 1.0000 | ORAL_TABLET | Freq: Two times a day (BID) | ORAL | Status: DC

## 2018-10-14 MED ORDER — PREDNISONE 5 MG PO TABS: 5.0000 mg | ORAL_TABLET | Freq: Three times a day (TID) | ORAL | Status: DC

## 2018-10-14 MED ORDER — METOPROLOL SUCCINATE ER 25 MG PO TB24
12.5000 mg | ORAL_TABLET | Freq: Every day | ORAL | Status: DC
Start: 2018-10-14 — End: 2018-10-14
  Administered 2018-10-14: 12.5 mg via ORAL
  Filled 2018-10-14: qty 1

## 2018-10-14 MED ORDER — AMOXICILLIN-POT CLAVULANATE 875-125 MG PO TABS
1.0000 | ORAL_TABLET | Freq: Two times a day (BID) | ORAL | Status: DC
  Administered 2018-10-14: 1 via ORAL
  Filled 2018-10-14: qty 1

## 2018-10-14 MED ORDER — METOPROLOL SUCCINATE ER 25 MG PO TB24: 25.0000 mg | ORAL_TABLET | Freq: Every day | ORAL | 0 refills | Status: AC

## 2018-10-14 MED ORDER — AMOXICILLIN-POT CLAVULANATE 875-125 MG PO TABS: 1.0000 | ORAL_TABLET | Freq: Two times a day (BID) | ORAL | 0 refills | Status: AC

## 2018-10-14 MED ORDER — PREDNISONE 20 MG PO TABS: ORAL_TABLET | ORAL | Status: DC

## 2018-10-14 MED ORDER — POTASSIUM CHLORIDE CRYS ER 20 MEQ PO TBCR
40.0000 meq | EXTENDED_RELEASE_TABLET | Freq: Once | ORAL | Status: AC
  Administered 2018-10-14: 40 meq via ORAL
  Filled 2018-10-14: qty 2

## 2018-10-14 NOTE — Discharge Summary (Signed)
Physician Discharge Summary  Amber [name] ZOX:[id number] DOB: 07-06-1956 DOA: 10/10/2018  PCP: Amber Real, MD  Admit date: 10/10/2018 Discharge date: 10/14/2018  Time spent: 45 minutes  Recommendations for Outpatient Follow-up:  Patient will be discharged to skilled nursing facility, continue physical and occupational therapy.  Patient will need to follow up with primary care provider within one week of discharge, repeat CBC and BMP.  Patient should continue medications as prescribed.  Patient should follow a dysphagia 3 diet.   Discharge Diagnoses:  Sepsis secondary to multifocal pneumonia, possible aspiration Acute on chronic diastolic CHF Normocytic anemia, thrombocytopenia Paroxysmal atrial fibrillation Anasarca History of CVA Diabetes mellitus, type II Steroid dependence Acute metabolic encephalopathy Chronic pain Failure to thrive/protein calorie malnutrition Seizure disorder Thrush GERD Hypokalemia/hypomagnesemia Hypertension  Discharge Condition: Stable  Diet recommendation: dysphagia 3  Filed Weights   10/12/18 0700 10/12/18 1900 10/14/18 0623  Weight: 45 kg 42.9 kg 40.5 kg    History of present illness:  on 10/10/2018 by Dr. Viona Gilmore Goodsonis a 62 y.o.femalewith medical history significant forchronic atrial fibrillation no longer on anticoagulation due to GI bleeding, chronic pain, chronic steroid use, seizure disorder, arthritis, history of CVA, and recent admission with septic shock secondary to E. coli UTI, now presenting with lethargy, nausea, and vomiting. Patient's family reported worsening lethargy over the past couple days with some confusion, nausea, and vomiting. There was no blood or dark vomitus noted. Patient had complained to a family member of some abdominal pain yesterday. She required blood transfusion during the recent hospitalization but family has not noted any melena or hematochezia. SNF was recommended during the recent  hospitalization, but patient's family elected to bring her back home.  Hospital Course:  Sepsis secondary to multifocal pneumonia, possible aspiration -Patient presented with lethargy, nausea and vomiting confusion.  Found to have fever, tachycardia, leukocytosis, elevated lactic acid level -CT findings concerning for multifocal pneumonia -Blood cultures show no growth  -Urine culture shows no growth  -Placed on IV fluids -Speech therapy consulted and recommended dysphagia 3 diet -Was noted to have vascular congestion and given IV Lasix on 10/12/2018 -Was on vancomycin and cefepime, transitioned to Zosyn -will discharge with Augmentin for additional 2 days  Acute on chronic diastolic CHF -Was on IV fluids, however this was discontinued -Echocardiogram 09/25/2018 showed EF of 65 to 70%, grade 1 diastolic dysfunction -Patient was started on IV Lasix. Now on oral lasix 20mg  daily -Monitor intake and output, daily weights  Normocytic anemia, thrombocytopenia -Hemoglobin was 8.1 on admission, patient was given 1 unit PRBC -Hemoglobin currently 11.3 -Platelet count currently 79 -Repeat CBC in one week  Paroxysmal atrial fibrillation -CHADSVASC 5 -No longer on anticoagulation due to GI bleed -restarted metoprolol   Anasarca -Echocardiogram as above -Suspect secondary to hypoalbuminemia -Nutrition consulted -Was given IV Lasix  History of CVA -CTA unremarkable, continue statin -PT/OT recommended SNF, however family wants to take her home with home health. (It seems this occurred on her last admission as well, and she was discharged home on 10/01/2018)  Diabetes mellitus, type II -Continue insulin sliding scale and CBG monitoring -resume metformin on discharge  Steroid dependence -Patient takes 15 mg of prednisone daily, unclear indication -Currently no hypotension or electrolyte abnormalities.  Was given hydrocortisone in the emergency department. -was on Solu-Medrol,  transitioned to oral, will discharge with taper, back down to home dose by 10/18/18  Acute metabolic encephalopathy -Suspect secondary to infection -As above, CT head unremarkable  Chronic pain -Continue pain control, home  regimen oxycodone, morphine as needed  Failure to thrive/protein calorie malnutrition -Nutrition consulted, continue supplements  Seizure disorder -Continue valproic acid  Thrush -Continue Diflucan for additional 6 days  GERD -Continue PPI  Hypokalemia/hypomagnesemia -WNL  Hypertension -Continue lasix -not sure why metoprolol was not continued- it was on her last discharge summary on 10/01/2018 -restarted metoprolol  NSVT -overnight had run of VT -K 3.8 Mag 1.8-WNL however replaced for goal of K4/ Mg 2  Consultants None  Procedures  None  Discharge Exam: Vitals:   10/14/18 1329 10/14/18 1623  BP: (!) 146/101 107/80  Pulse: 93 (!) 115  Resp: 20   Temp: 98.7 F (37.1 C)   SpO2: 100%    Patient has no complaints today.  Denies current chest pain, shortness breath, abdominal pain, nausea or vomiting, diarrhea constipation.  Wanting to go home.   General: Well developed, elderly, NAD  HEENT: NCAT, mucous membranes moist.  Cardiovascular: S1 S2 auscultated, RRR  Respiratory: Diminished breath sounds, no wheezing  Abdomen: Soft, nontender, nondistended, + bowel sounds  Extremities: warm dry without cyanosis clubbing or edema  Neuro: AAOx3, nonfocal  Psych: Appropriate mood and affect, pleasant   Discharge Instructions Discharge Instructions    Discharge instructions   Complete by:  As directed    Patient will be discharged to home with home health, physical and occupational therapy. Patient will need to follow up with primary care provider within one week of discharge.  Patient should continue medications as prescribed.  Patient should follow a dysphagia 3 diet.     Allergies as of 10/14/2018      Reactions   Ivp Dye  [iodinated Diagnostic Agents] Itching   Metrizamide Itching      Medication List    STOP taking these medications   ferrous sulfate 325 (65 FE) MG tablet   levETIRAcetam 500 MG tablet Commonly known as:  KEPPRA   Oxycodone HCl 20 MG Tabs   Vitamin D (Ergocalciferol) 1.25 MG (50000 UT) Caps capsule Commonly known as:  DRISDOL     TAKE these medications   allopurinol 300 MG tablet Commonly known as:  ZYLOPRIM Take 300 mg by mouth daily.   amoxicillin-clavulanate 875-125 MG tablet Commonly known as:  AUGMENTIN Take 1 tablet by mouth every 12 (twelve) hours for 2 days.   atorvastatin 10 MG tablet Commonly known as:  LIPITOR Take 10 mg by mouth daily at 6 PM.   colestipol 1 g tablet Commonly known as:  COLESTID Take 1 g by mouth 2 (two) times daily.   feeding supplement (ENSURE ENLIVE) Liqd Take 237 mLs by mouth 2 (two) times daily between meals.   fluconazole 100 MG tablet Commonly known as:  DIFLUCAN Take 1 tablet (100 mg total) by mouth daily. Start taking on:  10/15/2018   furosemide 20 MG tablet Commonly known as:  LASIX Take 1 tablet (20 mg total) by mouth daily. Start taking on:  10/15/2018   METFORMIN HCL PO Take 1 tablet by mouth as needed (BS).   metoprolol succinate 25 MG 24 hr tablet Commonly known as:  TOPROL-XL Take 1 tablet (25 mg total) by mouth daily. Start taking on:  10/15/2018   multivitamin with minerals Tabs tablet Take 1 tablet by mouth daily.   pantoprazole 40 MG tablet Commonly known as:  PROTONIX Take 40 mg by mouth 2 (two) times daily.   predniSONE 5 MG tablet Commonly known as:  DELTASONE Take 1 tablet (5 mg total) by mouth 3 (three) times daily. Restart  after prednisone taper. What changed:  additional instructions   predniSONE 20 MG tablet Commonly known as:  DELTASONE Take in the morning. Take 2 tabs x 1 days, then 1 tabs x 2 days. On 11/29, resume home dose. What changed:  You were already taking a medication with the  same name, and this prescription was added. Make sure you understand how and when to take each.   valproic acid 250 MG capsule Commonly known as:  DEPAKENE Take 500 mg by mouth 3 (three) times daily.      Allergies  Allergen Reactions  . Ivp Dye [Iodinated Diagnostic Agents] Itching  . Metrizamide Itching   Follow-up Information    Amber Real, MD. Schedule an appointment as soon as possible for a visit in 1 week(s).   Specialty:  Internal Medicine Why:  Hospital follow up Contact information: [address] [place] [address] [phone]        Care, [facility] Follow up.   Specialty:  Home Health Services Why:  custodial level aide service Contact information: [address] [place] [address] [phone]            The results of significant diagnostics from this hospitalization (including imaging, microbiology, ancillary and laboratory) are listed below for reference.    Significant Diagnostic Studies: Ct Abdomen Pelvis Wo Contrast  Result Date: 10/10/2018 CLINICAL DATA:  Abdominal pain IV contrast allergy.  No oral contrast per ordering MD EXAM: CT CHEST, ABDOMEN AND PELVIS WITHOUT CONTRAST TECHNIQUE: Multidetector CT imaging of the chest, abdomen and pelvis was performed following the standard protocol without IV contrast. COMPARISON:  Chest CT 09/09/2018 FINDINGS: CT CHEST FINDINGS Cardiovascular: Significant streak artifact of the mediastinum. No pericardial effusion. No gross abnormality of the heart or great vessels. Mediastinum/Nodes: No axillary or supraclavicular adenopathy. No mediastinal hilar adenopathy. Lungs/Pleura: Bilateral perihilar patchy ground-glass densities these ground-glass densities or more nodular in shape in the inferior lingula measuring up to 12 mm No pleural fluid. No pneumothorax. Interlobular septal thickening at the lung bases. Musculoskeletal: Posterior thoracic fusion. Severe degenerative arthropathy at the  glenohumeral joint. There is a well-circumscribed lytic lesion the proximal femur humerus which is enlarging over comparison exams from 2018. Lesion is seen on axial image 12/12 and on the CT scout measuring approximately 12 mm. CT ABDOMEN AND PELVIS FINDINGS Hepatobiliary: No focal hepatic lesion on noncontrast exam. Pancreas: Chronic calcifications throughout the pancreas. Spleen: Normal spleen Adrenals/urinary tract: Adrenal glands and kidneys are normal. The ureters and bladder normal. Stomach/Bowel: Stomach, small bowel, appendix, and cecum are normal. The colon and rectosigmoid colon are normal. Vascular/Lymphatic: Abdominal aorta is normal caliber with atherosclerotic calcification. There is no retroperitoneal or periportal lymphadenopathy. No pelvic lymphadenopathy. Reproductive: Uterus and ovaries normal. Other: No free fluid.  Anasarca of the soft tissues. Musculoskeletal: No aggressive osseous lesion IMPRESSION: Chest Impression: 1. Bilateral patchy ground-glass nodular densities concerning for multifocal pulmonary infection. 2. Mild interlobular septal thickening in the lung bases suggests interstitial edema 3. No lymphadenopathy. 4. 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 5. Enlarging lytic lesion in the proximal RIGHT humerus differential including infection, erosive arthropathy or neoplasm. Consider dedicated films of the RIGHT shoulder. Abdomen / Pelvis Impression: 1. Anasarca of the soft tissues. 2.  Aortic Atherosclerosis (ICD10-I70.0). Electronically Signed   By: Genevive Bi M.D.   On: 10/10/2018 16:14   Dg Chest 2 View  Result Date: 10/10/2018 CLINICAL DATA:  Lethargy, weakness. EXAM: CHEST - 2 VIEW COMPARISON:  Radiograph of September 25, 2018. FINDINGS: Stable cardiomediastinal silhouette. No pneumothorax or pleural effusion is noted. Old right rib fractures are noted. Status post surgical posterior fusion. No acute pulmonary disease is noted. IMPRESSION: No active  cardiopulmonary disease. Electronically Signed   By: Lupita Raider, M.D.   On: 10/10/2018 12:33   Ct Head Wo Contrast  Result Date: 10/10/2018 CLINICAL DATA:  62 year old female with lethargy, vomiting, sepsis. EXAM: CT HEAD WITHOUT CONTRAST TECHNIQUE: Contiguous axial images were obtained from the base of the skull through the vertex without intravenous contrast. COMPARISON:  Brain MRI 09/29/2018. Head CT 09/26/2018, and earlier. FINDINGS: Brain: Right MCA territory encephalomalacia with Patchy and confluent bilateral cerebral white matter hypodensity, and bilateral thalamic heterogeneity. Superimposed bilateral small chronic cerebellar infarcts, better demonstrated by MRI. No midline shift, ventriculomegaly, mass effect, evidence of mass lesion, intracranial hemorrhage or evidence of cortically based acute infarction. Vascular: Calcified atherosclerosis at the skull base. No suspicious intracranial vascular hyperdensity. Skull: Osteopenia.  No acute osseous abnormality identified. Sinuses/Orbits: Visualized paranasal sinuses and mastoids are stable and well pneumatized. Other: No acute orbit or scalp soft tissue finding. IMPRESSION: 1. No acute intracranial abnormality. 2. Stable appearance of advanced chronic ischemic disease. Electronically Signed   By: Odessa Fleming M.D.   On: 10/10/2018 15:56   Ct Head Wo Contrast  Result Date: 09/26/2018 CLINICAL DATA:  Unexplained altered level of consciousness EXAM: CT HEAD WITHOUT CONTRAST TECHNIQUE: Contiguous axial images were obtained from the base of the skull through the vertex without intravenous contrast. COMPARISON:  07/15/2018 FINDINGS: Brain: No evidence of acute infarction, hemorrhage, hydrocephalus, extra-axial collection or mass lesion/mass effect. Moderate remote infarct in the posterior right frontal lobe. Chronic small vessel ischemia in the cerebral white matter with remote lacunar infarcts in the right caudate and bilateral thalamus. Small remote  bilateral cerebellar infarcts. Age advanced cerebral volume loss. Vascular: Atherosclerotic calcification.  No hyperdense vessel. Skull: Negative Sinuses/Orbits: Negative IMPRESSION: No acute finding. Chronic ischemic injury as described. Electronically Signed   By: Marnee Spring M.D.   On: 09/26/2018 15:27   Ct Chest Wo Contrast  Result Date: 10/10/2018 CLINICAL DATA:  Abdominal pain IV contrast allergy.  No oral contrast per ordering MD EXAM: CT CHEST, ABDOMEN AND PELVIS WITHOUT CONTRAST TECHNIQUE: Multidetector CT imaging of the chest, abdomen and pelvis was performed following the standard protocol without IV contrast. COMPARISON:  Chest CT 09/09/2018 FINDINGS: CT CHEST FINDINGS Cardiovascular: Significant streak artifact of the mediastinum. No pericardial effusion. No gross abnormality of the heart or great vessels. Mediastinum/Nodes: No axillary or supraclavicular adenopathy. No mediastinal hilar adenopathy. Lungs/Pleura: Bilateral perihilar patchy ground-glass densities these ground-glass densities or more nodular in shape in the inferior lingula measuring up to 12 mm No pleural fluid. No pneumothorax. Interlobular septal thickening at the lung bases. Musculoskeletal: Posterior thoracic fusion. Severe degenerative arthropathy at the glenohumeral joint. There is a well-circumscribed lytic lesion the proximal femur humerus which is enlarging over comparison exams from 2018. Lesion is seen on axial image 12/12 and on the CT scout measuring approximately 12 mm. CT ABDOMEN AND PELVIS FINDINGS Hepatobiliary: No focal hepatic lesion on noncontrast exam. Pancreas: Chronic calcifications throughout the pancreas. Spleen: Normal spleen Adrenals/urinary tract: Adrenal glands and kidneys are normal. The ureters and bladder normal. Stomach/Bowel: Stomach, small bowel, appendix, and cecum are normal. The colon and rectosigmoid colon are normal. Vascular/Lymphatic: Abdominal aorta is normal caliber with  atherosclerotic calcification. There is no retroperitoneal or periportal lymphadenopathy. No pelvic lymphadenopathy. Reproductive: Uterus and ovaries  normal. Other: No free fluid.  Anasarca of the soft tissues. Musculoskeletal: No aggressive osseous lesion IMPRESSION: Chest Impression: 1. Bilateral patchy ground-glass nodular densities concerning for multifocal pulmonary infection. 2. Mild interlobular septal thickening in the lung bases suggests interstitial edema 3. No lymphadenopathy. 4. 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 5. Enlarging lytic lesion in the proximal RIGHT humerus differential including infection, erosive arthropathy or neoplasm. Consider dedicated films of the RIGHT shoulder. Abdomen / Pelvis Impression: 1. Anasarca of the soft tissues. 2.  Aortic Atherosclerosis (ICD10-I70.0). Electronically Signed   By: Genevive Bi M.D.   On: 10/10/2018 16:14   Mr Brain Wo Contrast  Result Date: 09/29/2018 CLINICAL DATA:  Generalized weakness. Altered mental status. History of stroke. Possible metabolic encephalopathy. EXAM: MRI HEAD WITHOUT CONTRAST TECHNIQUE: Multiplanar, multiecho pulse sequences of the brain and surrounding structures were obtained without intravenous contrast. COMPARISON:  MR brain 05/10/2018. CT head 09/26/2018. FINDINGS: The patient was unable to remain motionless for the exam. There is significant motion degradation. Small or subtle lesions could be overlooked. The study is of relatively poor diagnostic quality. Brain: No evidence for acute infarction, hemorrhage, mass lesion, hydrocephalus, or extra-axial fluid. Generalized atrophy. T2 and FLAIR hyperintensities in the white matter, consistent with small vessel disease. Remote BILATERAL cerebellar infarcts. Large remote RIGHT posterior frontal infarct. Vascular: Flow voids are maintained in the BILATERAL carotid arteries, basilar artery, and RIGHT vertebral artery. The LEFT vertebral is small, and appears to  terminate in PICA. The basilar is hypoplastic due to BILATERAL fetal PCA anatomy. Skull and upper cervical spine: Normal marrow signal. No upper cervical lesions. Sinuses/Orbits: No significant sinus opacity. No gross orbital findings. Other: None. IMPRESSION: Significantly motion degraded scan. No acute stroke or intracranial mass lesion. Atrophy and small vessel disease with widespread areas of chronic infarction. Similar appearance to priors. Electronically Signed   By: Elsie Stain M.D.   On: 09/29/2018 17:09   Dg Chest Port 1 View  Result Date: 10/12/2018 CLINICAL DATA:  Shortness of breath EXAM: PORTABLE CHEST 1 VIEW COMPARISON:  10/10/2018 FINDINGS: Postoperative changes in the cervicothoracic spine. Shallow inspiration. Cardiac enlargement with mild vascular congestion, progressing since previous study. Atelectasis or infiltration in the left lung base behind the heart. No blunting of costophrenic angles. No pneumothorax. Old fracture deformities of the right shoulder and right upper ribs. Degenerative changes in the left shoulder. IMPRESSION: Cardiac enlargement with mild vascular congestion progressing since previous study. Developing atelectasis or infiltration in the left lung base. Electronically Signed   By: Burman Nieves M.D.   On: 10/12/2018 06:38   Dg Chest Port 1 View  Result Date: 09/25/2018 CLINICAL DATA:  Respiratory failure. EXAM: PORTABLE CHEST 1 VIEW COMPARISON:  Radiograph of September 24, 2018. FINDINGS: Stable cardiomediastinal silhouette. No pneumothorax is noted. Old right rib fractures are noted. Mild bibasilar subsegmental atelectasis or infiltrates is noted. Status post surgical posterior fusion of thoracic spine. IMPRESSION: Mild bibasilar subsegmental atelectasis or infiltrates. Electronically Signed   By: Lupita Raider, M.D.   On: 09/25/2018 07:47   Dg Chest Portable 1 View  Result Date: 09/24/2018 CLINICAL DATA:  Lethargy. Recent hospitalization for pneumonia and  sepsis. EXAM: PORTABLE CHEST 1 VIEW COMPARISON:  Chest CT 09/09/2018 and radiograph 09/06/2018 FINDINGS: The cardiac silhouette is upper limits of normal in size. There is mild chronic interstitial coarsening. The lungs are mildly hypoinflated with mild bilateral infrahilar opacity. No sizable pleural effusion or pneumothorax is identified. A well-defined lucent lesion in the proximal right  humerus is chronic. There are degenerative changes at both shoulders. Right upper quadrant abdominal surgical clips and prior thoracic fusion are noted. IMPRESSION: Mild bilateral infrahilar opacities which may reflect atelectasis or pneumonia. Electronically Signed   By: Sebastian Ache M.D.   On: 09/24/2018 20:42   Dg Shoulder Right Port  Result Date: 10/10/2018 CLINICAL DATA:  Right shoulder arthropathy. EXAM: PORTABLE RIGHT SHOULDER COMPARISON:  Chest CT 09/09/2018 and 10/10/2018 and prior radiographs 11/18/2017. 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 Marked erosive changes involving the humeral head and neck and glenoid. Remote right rib fractures are noted.  The right lung is clear. 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 Electronically Signed   By: Rudie Meyer M.D.   On: 10/10/2018 19:24    Microbiology: Recent Results (from the past 240 hour(s))  Urine culture     Status: None   Collection Time: 10/10/18  2:05 PM  Result Value Ref Range Status   Specimen Description   Final    URINE, CATHETERIZED Performed at [facility], [address] [place], [place] [address]    Special Requests   Final    NONE Performed at [facility], [address] [place], [place] [address]    Culture   Final    NO GROWTH Performed at [facility], [address], [place], [place] [address]    Report Status 10/11/2018 FINAL  Final  Culture, blood  (routine x 2)     Status: None (Preliminary result)   Collection Time: 10/10/18  2:05 PM  Result Value Ref Range Status   Specimen Description   Final    BLOOD LEFT NECK Performed at [facility], [address] [place], [place] [address]    Special Requests   Final    BOTTLES DRAWN AEROBIC AND ANAEROBIC Blood Culture adequate volume JUGULAR VEIN Performed at [facility], [address], [place], [place] [address]    Culture   Final    NO GROWTH 4 DAYS Performed at [facility], [address] [place], [place] [address]    Report Status PENDING  Incomplete  MRSA PCR Screening     Status: None   Collection Time: 10/10/18 10:09 PM  Result Value Ref Range Status   MRSA by PCR NEGATIVE NEGATIVE Final    Comment:        The GeneXpert MRSA Assay (FDA approved for NASAL specimens only), is one component of a comprehensive MRSA colonization surveillance program. It is not intended to diagnose MRSA infection nor to guide or monitor treatment for MRSA infections. Performed at [facility], [address] [place], [place] [address]      Labs: Basic Metabolic Panel: Recent Labs  Lab 10/10/18 1312 10/11/18 0202 10/12/18 0309 10/13/18 0752 10/14/18 0920  NA 142 145 143 142 140  K 4.0 3.8 3.2* 3.7 3.8  CL 113* 116* 114* 106 102  CO2 22 21* 20* 28 29  GLUCOSE 193* 88 257* 95 220*  BUN 16 16 17 14 15   CREATININE 0.87 0.72 0.86 0.76 0.79  CALCIUM 7.7* 7.8* 7.6* 8.0* 8.1*  MG  --   --  1.6*  --  1.8  PHOS  --   --  2.9  --   --    Liver Function Tests: Recent Labs  Lab  10/10/18 1312 10/12/18 0309  AST 23 22  ALT 18 15  ALKPHOS 100 78  BILITOT 0.5 0.3  PROT 5.2* 4.8*  ALBUMIN 1.7* 2.1*   No results for input(s): LIPASE, AMYLASE in the last 168 hours. No results for input(s): AMMONIA in the last 168 hours. CBC: Recent Labs  Lab 10/10/18 1312 10/11/18 0202 10/11/18 2013 10/12/18 0309 10/13/18 0752  WBC 16.4*  10.6* 6.1 6.0 6.1  NEUTROABS 12.3* 9.6*  --  4.7  --   HGB 8.1* 7.0* 9.2* 9.2* 11.3*  HCT 26.7* 22.6* 28.7* 27.4* 36.6  MCV 89.6 88.3 87.8 82.0 87.8  PLT 106* 86* 79* 68* 79*   Cardiac Enzymes: No results for input(s): CKTOTAL, CKMB, CKMBINDEX, TROPONINI in the last 168 hours. BNP: BNP (last 3 results) Recent Labs    11/18/17 1800  BNP 128.9*    ProBNP (last 3 results) No results for input(s): PROBNP in the last 8760 hours.  CBG: Recent Labs  Lab 10/13/18 1158 10/13/18 1632 10/13/18 2103 10/14/18 0804 10/14/18 1117  GLUCAP 220* 306* 176* 219* 209*       Signed:  Greidys Deland  [facility] 10/14/2018, 4:27 PM

## 2018-10-14 NOTE — Progress Notes (Signed)
ON call made aware of patient run of V.tach. No new order received. VS stable. Patient asymptomatic.

## 2018-10-14 NOTE — Care Management Note (Signed)
Case Management Note  Patient Details  Name: Amber [name] MRN: [id number] Date of Birth: Oct 04, 1956  Subjective/Objective: Sepsis. From home. PT-recc SNF. CSW already following for SNF.                   Action/Plan:dc SNF.   Expected Discharge Date:                  Expected Discharge Plan:  Skilled Nursing Facility  In-House Referral:  Clinical Social Work  Discharge planning Services  CM Consult  Post Acute Care Choice:    Choice offered to:     DME Arranged:    DME Agency:     HH Arranged:    HH Agency:     Status of Service:  In process, will continue to follow  If discussed at Long Length of Stay Meetings, dates discussed:    Additional Comments:  Lanier Clam, RN 10/14/2018, 11:06 AM

## 2018-10-14 NOTE — Progress Notes (Signed)
Physical Therapy Treatment Patient Details Name: Amber [name] MRN: [id number] DOB: 02-Apr-1956 Today's Date: 10/14/2018    History of Present Illness 62 y.o. female admitted 11/21 with lethargy, Nausea and vomiting. Dx: sepsis due to pna. Recently in hospital for hypotension and AMS , PNA and Sepsis.Marland Kitchen  PMH includes: CVA, HTN, Anemia, GERD, Seizures, , ACS, Frequent Falls, Back surgery.      PT Comments    Max encouragement for pt participation. She refused to perform any UE exercises. Noticed soiled linens while prepping to work with pt. Assisted pt OOB and to recliner. Unable to attempt ambulation due to weakness, incontinence, and +2 assist unavailable. Made RN aware that pt would need fresh linens and bathing assistance. Continue to recommend SNF.     Follow Up Recommendations  SNF     Equipment Recommendations  Hospital bed;Wheelchair    Recommendations for Other Services       Precautions / Restrictions Precautions Precautions: Fall Precaution Comments: incontinence Restrictions Weight Bearing Restrictions: No    Mobility  Bed Mobility Overal bed mobility: Needs Assistance Bed Mobility: Supine to Sit     Supine to sit: Max assist;HOB elevated     General bed mobility comments: Assist for trunk and bil LEs. Utilized bedpad to aid with scooting, positioning. Increased time. Cues for pt to put forth max effort.   Transfers Overall transfer level: Needs assistance   Transfers: Sit to/from Stand;Stand Pivot Transfers Sit to Stand: Max assist Stand pivot transfers: Total assist       General transfer comment: Assist to rise, stabilize, weight shift, control descent. Pt had difficulty taking steps. Bed to chair transfer only ontoday. Bowel incontinence noted.   Ambulation/Gait             General Gait Details: NT due to weakness, incontinence, +2 assist not available.    Stairs             Wheelchair Mobility    Modified Rankin (Stroke Patients  Only)       Balance Overall balance assessment: Needs assistance         Standing balance support: Bilateral upper extremity supported Standing balance-Leahy Scale: Poor                              Cognition Arousal/Alertness: Awake/alert Behavior During Therapy: Flat affect Overall Cognitive Status: No family/caregiver present to determine baseline cognitive functioning                   Orientation Level: Disoriented to;Time;Situation     Following Commands: Follows one step commands with increased time     Problem Solving: Slow processing;Difficulty sequencing;Requires verbal cues        Exercises      General Comments        Pertinent Vitals/Pain Pain Assessment: Faces Faces Pain Scale: No hurt    Home Living                      Prior Function            PT Goals (current goals can now be found in the care plan section) Progress towards PT goals: Progressing toward goals    Frequency    Min 2X/week      PT Plan Current plan remains appropriate    Co-evaluation              AM-PAC PT "6 Clicks" Mobility  Outcome Measure  Help needed turning from your back to your side while in a flat bed without using bedrails?: Total Help needed moving from lying on your back to sitting on the side of a flat bed without using bedrails?: Total Help needed moving to and from a bed to a chair (including a wheelchair)?: Total Help needed standing up from a chair using your arms (e.g., wheelchair or bedside chair)?: Total Help needed to walk in hospital room?: Total Help needed climbing 3-5 steps with a railing? : Total 6 Click Score: 6    End of Session Equipment Utilized During Treatment: Gait belt Activity Tolerance: Patient limited by fatigue Patient left: in chair;with call bell/phone within reach;with chair alarm set Nurse Communication: (need for linens changed/hygiene due to incontinence) PT Visit Diagnosis:  Muscle weakness (generalized) (M62.81);Difficulty in walking, not elsewhere classified (R26.2);Unsteadiness on feet (R26.81)     Time: 5631-4970 PT Time Calculation (min) (ACUTE ONLY): 20 min  Charges:  $Therapeutic Activity: 8-22 mins                        Rebeca Alert, PT Acute Rehabilitation Services Pager: [phone] Office: [phone]

## 2018-10-14 NOTE — Progress Notes (Signed)
Occupational Therapy Treatment Patient Details Name: Amber [name] MRN: [id number] DOB: 1956/07/19 Today's Date: 10/14/2018    History of present illness 62 y.o. female admitted 11/21 with lethargy, Nausea and vomiting. Dx: sepsis due to pna. Recently in hospital for hypotension and AMS , PNA and Sepsis.Marland Kitchen  PMH includes: CVA, HTN, Anemia, GERD, Seizures, , ACS, Frequent Falls, Back surgery.     OT comments  Red tubing issued to A pt with with holding fork  Follow Up Recommendations  SNF;Supervision/Assistance - 24 hour          Precautions / Restrictions Precautions Precautions: Fall Precaution Comments: incontinence Restrictions Weight Bearing Restrictions: No       Mobility Bed Mobility Overal bed mobility: Needs Assistance Bed Mobility: Supine to Sit;Sit to Supine     Supine to sit: Max assist;HOB elevated Sit to supine: Max assist   General bed mobility comments: Assist for trunk and bil LEs. Utilized bedpad to aid with scooting, positioning. Increased time. Cues for pt to put forth max effort.   Transfers Overall transfer level: Needs assistance   Transfers: Sit to/from Stand;Stand Pivot Transfers Sit to Stand: Max assist Stand pivot transfers: Total assist       General transfer comment: Assist to rise, stabilize, weight shift, control descent. Pt had difficulty taking steps. Bed to chair transfer only ontoday. Bowel incontinence noted.     Balance Overall balance assessment: Needs assistance   Sitting balance-Leahy Scale: Fair     Standing balance support: Bilateral upper extremity supported Standing balance-Leahy Scale: Poor                             ADL either performed or assessed with clinical judgement   ADL Overall ADL's : Needs assistance/impaired Eating/Feeding: Minimal assistance;Moderate assistance Eating/Feeding Details (indicate cue type and reason): issued red tubing to build up handle of fork.  Pt did muchbetter holding  fork with tubing. Pt most successful when try close and on lowest setting. Educated onuse of red tubing to build up handle. Explained she could use on fork, toothbrush, and writing utensil                                           Vision Patient Visual Report: No change from baseline     Perception     Praxis      Cognition Arousal/Alertness: Awake/alert Behavior During Therapy: Flat affect Overall Cognitive Status: No family/caregiver present to determine baseline cognitive functioning                   Orientation Level: Disoriented to;Time;Situation     Following Commands: Follows one step commands with increased time     Problem Solving: Slow processing;Difficulty sequencing;Requires verbal cues                     Pertinent Vitals/ Pain       Pain Assessment: No/denies pain Faces Pain Scale: No hurt         Frequency  Min 2X/week        Progress Toward Goals  OT Goals(current goals can now be found in the care plan section)  Progress towards OT goals: Progressing toward goals     Plan Discharge plan remains appropriate    Co-evaluation  AM-PAC OT "6 Clicks" Daily Activity     Outcome Measure   Help from another person eating meals?: A Lot Help from another person taking care of personal grooming?: A Lot Help from another person toileting, which includes using toliet, bedpan, or urinal?: Total Help from another person bathing (including washing, rinsing, drying)?: A Lot Help from another person to put on and taking off regular upper body clothing?: A Lot Help from another person to put on and taking off regular lower body clothing?: Total 6 Click Score: 10    End of Session    OT Visit Diagnosis: Unsteadiness on feet (R26.81);Other abnormalities of gait and mobility (R26.89);Muscle weakness (generalized) (M62.81);Other symptoms and signs involving cognitive function   Activity Tolerance Patient  limited by fatigue   Patient Left with call bell/phone within reach;in bed;with bed alarm set   Nurse Communication Mobility status        Time: 1420-1447 OT Time Calculation (min): 27 min  Charges: OT General Charges $OT Visit: 1 Visit OT Treatments $Self Care/Home Management : 23-37 mins  Lise Auer, OT Acute Rehabilitation Services [phone] Office- [phone]      Yisel Megill, Karin Golden D 10/14/2018, 4:08 PM

## 2018-10-14 NOTE — Clinical Social Work Note (Signed)
Clinical Social Work Assessment  Patient Details  Name: Amber [name] MRN: [id number] Date of Birth: 09/06/1956  Date of referral:  10/14/18               Reason for consult:  Discharge Planning                Permission sought to share information with:  Family Supports Permission granted to share information::  Yes, Verbal Permission Granted  Name::     Amber [name]  Agency::     Relationship::  daughter  Contact Information:     [phone]   Housing/Transportation Living arrangements for the past 2 months:  Single Family Home Source of Information:  Adult Children Patient Interpreter Needed:  None Criminal Activity/Legal Involvement Pertinent to Current Situation/Hospitalization:  No - Comment as needed Significant Relationships:  Adult Children Lives with:  Other (Comment)(granddaughter) Do you feel safe going back to the place where you live?  Yes Need for family participation in patient care:  Yes (Comment)  Care giving concerns:  Patient unable to do assessment with patient as she is only orient to self. CSW spoke with patients daughter Amber [name] via phone   Social Worker assessment / plan:  CSW following patient for support and discharge needs. CSW spoke with patients daughter Amber [name] and she stated that patients lives at home with her 60 year old granddaughter. Amber [name] stated that patient also receives an aide Monday -Friday from 8-10 am. Amber [name] stated she wants patient to come home with home health. CSW made MD and RNCM aware of daughters decisions  Employment status:  Retired Health and safety inspector:  Medicaid In [place] PT Recommendations:  Skilled Nursing Facility Information / Referral to community resources:  Skilled Nursing Facility  Patient/Family's Response to care:  Daughter appreciates CSW role in care  Patient/Family's Understanding of and Emotional Response to Diagnosis, Current Treatment, and Prognosis: Patient to return home with home health   Emotional  Assessment Appearance:  Appears stated age Attitude/Demeanor/Rapport:  Unable to Assess Affect (typically observed):  Unable to Assess Orientation:  Oriented to Self Alcohol / Substance use:  Not Applicable Psych involvement (Current and /or in the community):  No (Comment)  Discharge Needs  Concerns to be addressed:  No discharge needs identified Readmission within the last 30 days:  No Current discharge risk:  Dependent with Mobility Barriers to Discharge:  Continued Medical Work up   [employer], LCSW 10/14/2018, 4:45 PM

## 2018-10-14 NOTE — Progress Notes (Signed)
  Speech Language Pathology Treatment: Dysphagia  Patient Details Name: Amber [name] MRN: [id number] DOB: [birth year] Today's Date: 10/14/2018 Time: 2706-2376 SLP Time Calculation (min) (ACUTE ONLY): 10 min  Assessment / Plan / Recommendation Clinical Impression  Pt seen today to assure po tolerance and reinforce effective compensation strategies.  No indications of aspiration with po observed *Ensure*.  Pt will not feed herself stating it's "too hard".  Encouraged her to feed herself to help with airway protection during intake.  She denies coughing with intake. Recommend she continue her diet with generalized aspiration/esophageal precautions. Note pt no longers appears with oral candidiasis - thank you.  Will sign off as intake listed at 35%, WBC decreasing and pt appears to be tolerating po.  Will sign off, please reconsult if desired.   HPI HPI: 62 y.o. female admitted 10/10/18 with hypotension and AMS, n/v now being treated for PNA and Sepsis. Hemoglobin was low and pt to receive 1 unit RBC.  Pt has FTT.  Recent [facility] admit earlier this month with pna, well as October admit to [facility] with pna/low hemoglobin.  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  .  PMH includes: CVA, HTN, Anemia, GERD, Seizures, , ACS, Frequent Falls, Back surgery.  Pt had prior BSE in January 2018 recommending regular diet and thin liquids but MBS was recommended given h/o esophageal issues, hoarse vocal quality, and recurrent PNA. MBS was performed in October 2019 at [facility] with report suggesting an oral dysphagia although with flash penetration with thin liquids. Dys 2 diet and thin liquids were recommended per prior slp note.       SLP Plan  All goals met       Recommendations  Diet recommendations: Dysphagia 3 (mechanical soft);Thin liquid Liquids provided via: Straw;Cup Medication Administration: (as tolerated) Supervision: Patient able to self  feed(encouraged pt to feed self) Compensations: Slow rate;Small sips/bites Postural Changes and/or Swallow Maneuvers: Seated upright 90 degrees;Upright 30-60 min after meal                Oral Care Recommendations: Oral care BID Follow up Recommendations: None Plan: All goals met       GO               [name], MS [facility] SLP Acute Rehab Services Pager [phone] Office [phone]  [name] 10/14/2018, 12:34 PM

## 2018-10-14 NOTE — Care Management Note (Signed)
Case Management Note  Patient Details  Name: Amber [name] MRN: [id number] Date of Birth: 29-Jul-1956  Subjective/Objective:Patient was recc for SNF-patient/son Amber [name] declines SNF-they want home. CM unable to provide [facility] agency to accept-contacted several HHC agencies-unable to a ccept. [facility] able to provide custodial level aide service-rep [facility] aware. Family able to provide all the basic care needs,able to transport home on own.                    Action/Plan:dc home.   Expected Discharge Date:  10/14/18               Expected Discharge Plan:  Home/Self Care  In-House Referral:  Clinical Social Work  Discharge planning Services  CM Consult  Post Acute Care Choice:  Durable Medical Equipment(rollator,rw) Choice offered to:  Adult Children  DME Arranged:    DME Agency:     HH Arranged:  (custodial level aide.) HH Agency:  [facility]  Status of Service:  Completed, signed off  If discussed at Long Length of Stay Meetings, dates discussed:    Additional Comments:  Lanier Clam, RN 10/14/2018, 2:12 PM

## 2018-10-14 NOTE — Discharge Instructions (Signed)
Aspiration Pneumonia °Aspiration pneumonia is an infection in your lungs. It occurs when food, liquid, or stomach contents (vomit) are inhaled (aspirated) into your lungs. When these things get into your lungs, swelling (inflammation) and infection can occur. This can make it difficult for you to breathe. Aspiration pneumonia is a serious condition and can be life threatening. °What increases the risk? °Aspiration pneumonia is more likely to occur when a person's cough (gag) reflex or ability to swallow has been decreased. Some things that can do this include: °· Having a brain injury or disease, such as stroke, seizures, Parkinson's disease, dementia, or amyotrophic lateral sclerosis (ALS). °· Being given general anesthetic for procedures. °· Being in a coma (unconscious). °· Having a narrowing of the tube that carries food to the stomach (esophagus). °· Drinking too much alcohol. If a person passes out and vomits, vomit can be swallowed into the lungs. °· Taking certain medicines, such as tranquilizers or sedatives. ° °What are the signs or symptoms? °· Coughing after swallowing food or liquids. °· Breathing problems, such as wheezing or shortness of breath. °· Bluish skin. This can be caused by lack of oxygen. °· Coughing up food or mucus. The mucus might contain blood, greenish material, or yellowish-white fluid (pus). °· Fever. °· Chest pain. °· Being more tired than usual (fatigue). °· Sweating more than usual. °· Bad breath. °How is this diagnosed? °A physical exam will be done. During the exam, the health care provider will listen to your lungs with a stethoscope to check for: °· Crackling sounds in the lungs. °· Decreased breath sounds. °· A rapid heartbeat. ° °Various tests may be ordered. These may include: °· Chest X-ray. °· CT scan. °· Swallowing study. This test looks at how food is swallowed and whether it goes into your breathing tube (trachea) or food pipe (esophagus). °· Sputum culture. Saliva and  mucus (sputum) are collected from the lungs or the tubes that carry air to the lungs (bronchi). The sputum is then tested for bacteria. °· Bronchoscopy. This test uses a flexible tube (bronchoscope) to see inside the lungs. ° °How is this treated? °Treatment will usually include antibiotic medicines. Other medicines may also be used to reduce fever or pain. You may need to be treated in the hospital. In the hospital, your breathing will be carefully monitored. Depending on how well you are breathing, you may need to be given oxygen, or you may need breathing support from a breathing machine (ventilator). For people who fail a swallowing study, a feeding tube might be placed in the stomach, or they may be asked to avoid certain food textures or liquids when they eat. °Follow these instructions at home: °· Carefully follow any special eating instructions you were given, such as avoiding certain food textures or thickening liquids. This reduces the risk of developing aspiration pneumonia again. °· Only take over-the-counter or prescription medicines as directed by your health care provider. Follow the directions carefully. °· If you were prescribed antibiotics, take them as directed. Finish them even if you start to feel better. °· Rest as instructed by your health care provider. °· Keep all follow-up appointments with your health care provider. °Contact a health care provider if: °· You develop worsening shortness of breath, wheezing, or difficulty breathing. °· You develop a fever. °· You have chest pain. °This information is not intended to replace advice given to you by your health care provider. Make sure you discuss any questions you have with your health care   provider. °Document Released: 09/03/2009 Document Revised: 04/19/2016 Document Reviewed: 04/24/2013 °Elsevier Interactive Patient Education © 2017 [employer] ° °

## 2018-10-15 LAB — CULTURE, BLOOD (ROUTINE X 2): CULTURE: NO GROWTH

## 2018-11-10 IMAGING — DX DG CHEST 1V PORT
1 series · 1 of 1 positions shown · non-contrast
Comparison: Chest CT 09/09/2018 and radiograph 09/06/2018

CLINICAL DATA: Lethargy. Recent hospitalization for pneumonia and
sepsis.

EXAM:
PORTABLE CHEST 1 VIEW

[chest ap]
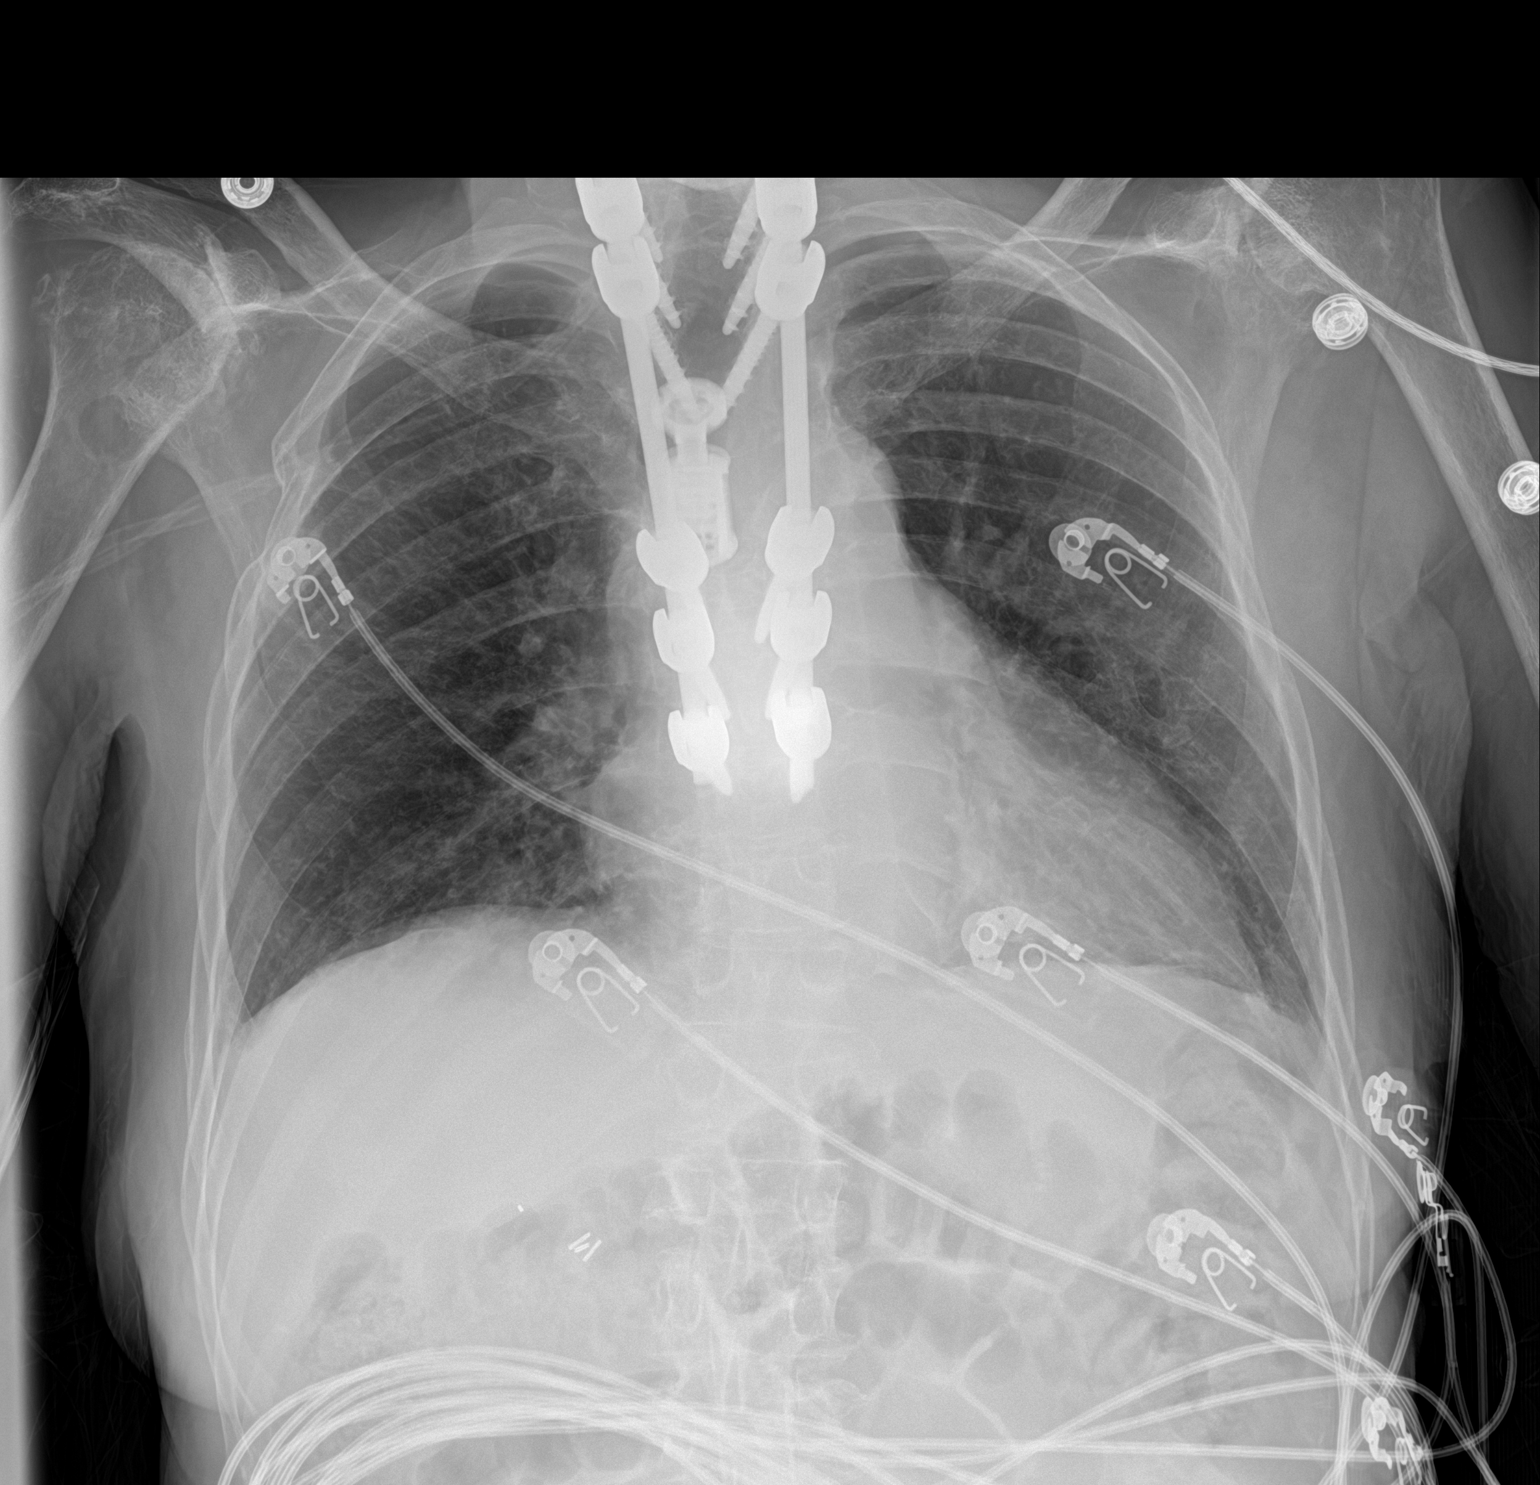

[1 of 1 positions shown; findings below may reference images not displayed]

FINDINGS: The cardiac silhouette is upper limits of normal in size. There is
mild chronic interstitial coarsening. The lungs are mildly
hypoinflated with mild bilateral infrahilar opacity. No sizable
pleural effusion or pneumothorax is identified. A well-defined
lucent lesion in the proximal right humerus is chronic. There are
degenerative changes at both shoulders. Right upper quadrant
abdominal surgical clips and prior thoracic fusion are noted.
IMPRESSION: Mild bilateral infrahilar opacities which may reflect atelectasis or
pneumonia..

## 2018-11-11 IMAGING — DX DG CHEST 1V PORT
1 series · 1 of 1 positions shown · non-contrast
Comparison: Radiograph September 24, 2018.

CLINICAL DATA: Respiratory failure.

EXAM:
PORTABLE CHEST 1 VIEW

[chest]
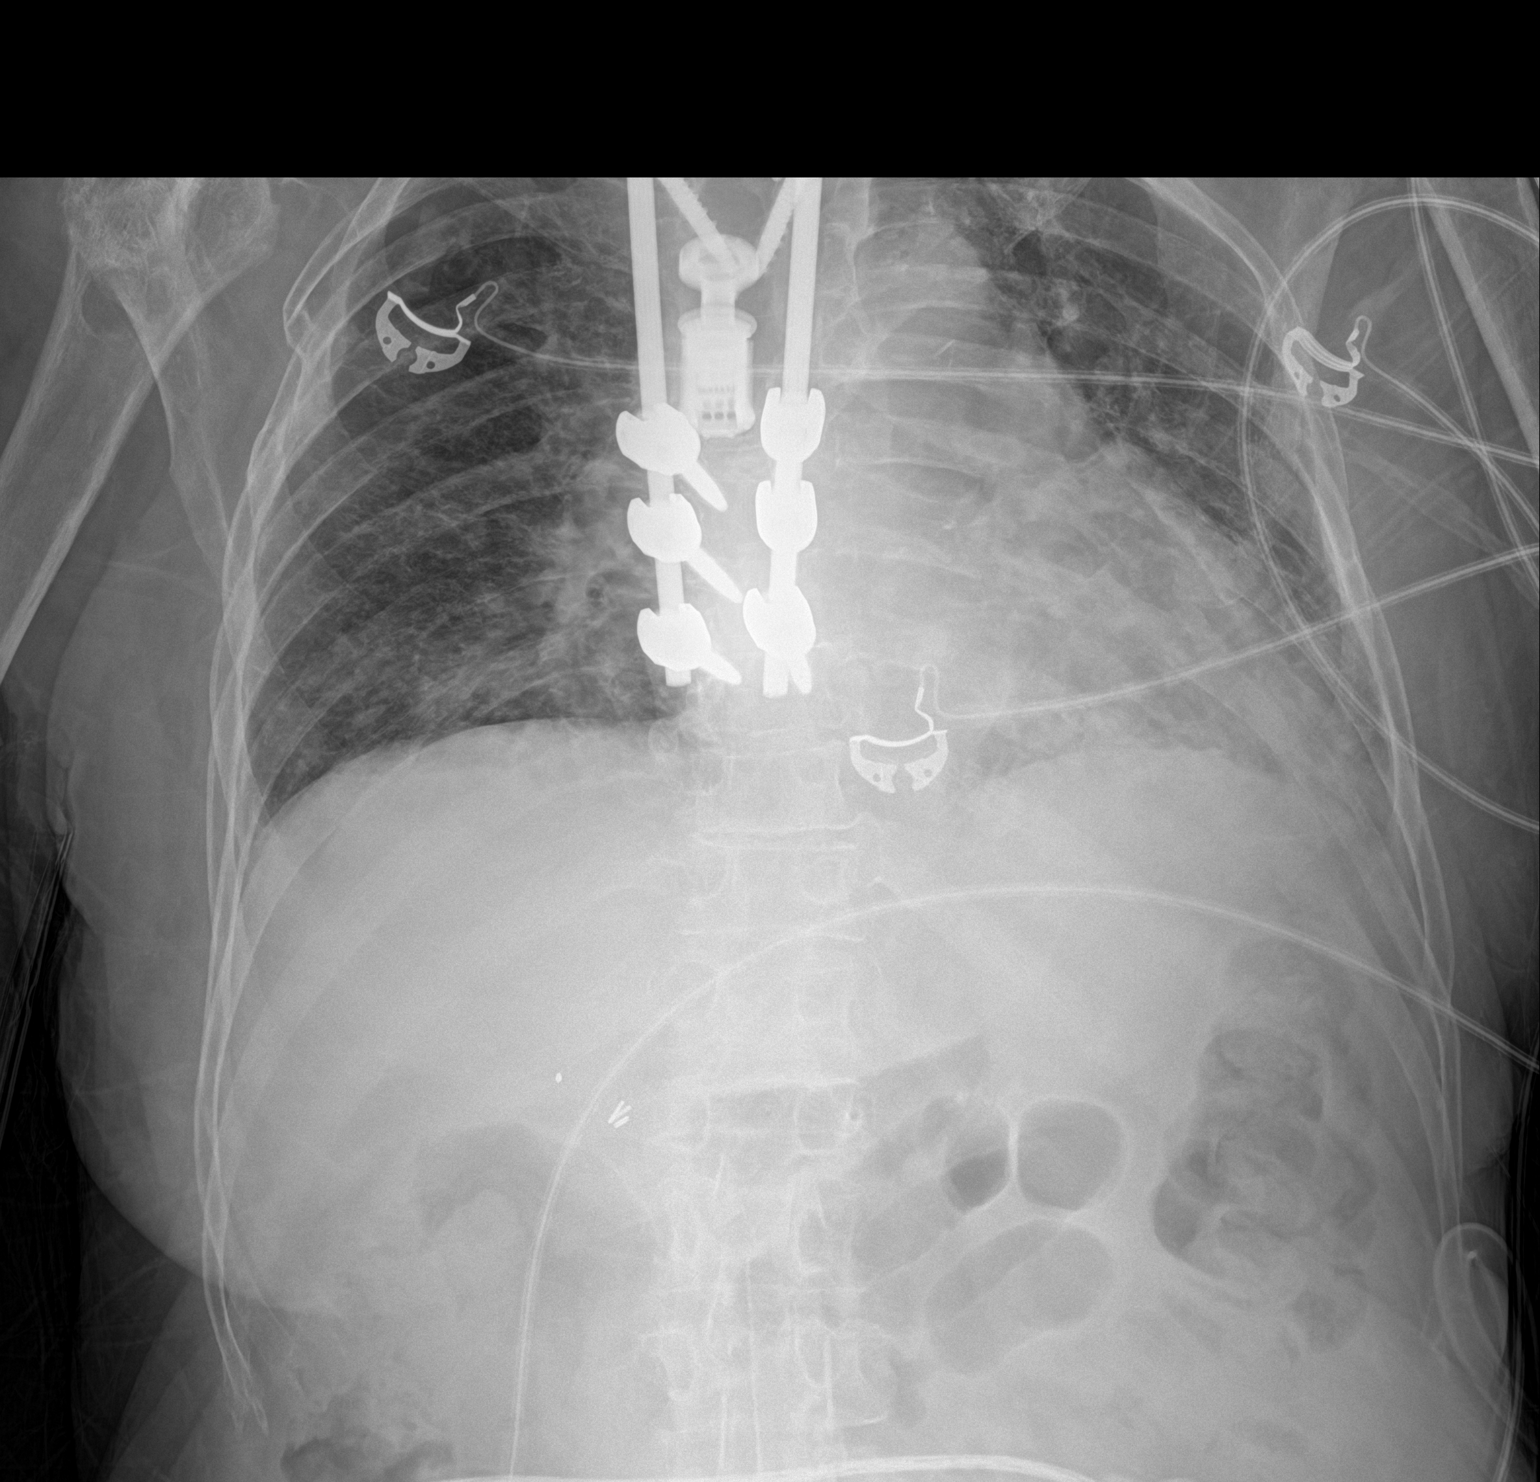

[1 of 1 positions shown; findings below may reference images not displayed]

FINDINGS: Stable cardiomediastinal silhouette. No pneumothorax is noted. Old
right rib fractures are noted. Mild bibasilar subsegmental
atelectasis or infiltrates is noted. Status post surgical posterior
fusion of thoracic spine.
IMPRESSION: Mild bibasilar subsegmental atelectasis or infiltrates.

## 2018-11-15 IMAGING — MR MR HEAD W/O CM
10 of 11 series · 42 of 48 positions shown · non-contrast
Comparison: MR brain 05/10/2018. CT head 09/26/2018.

CLINICAL DATA: Generalized weakness. Altered mental status. History
of stroke. Possible metabolic encephalopathy.

EXAM:
MRI HEAD WITHOUT CONTRAST
TECHNIQUE: Multiplanar, multiecho pulse sequences of the brain and surrounding
structures were obtained without intravenous contrast.

[Series 5: DWI · axial · 4.0mm · 0.88mm/px · z∈[-40,+89]mm · 8 of 68 slices shown (1 of 4)]
[im 1/68]
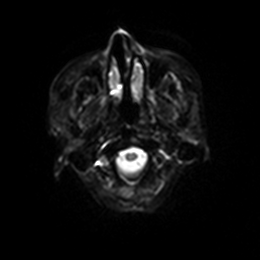
[im 10/68]
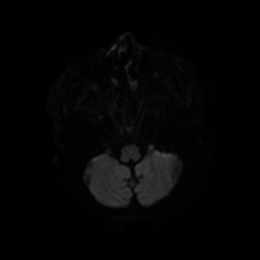
[im 20/68]
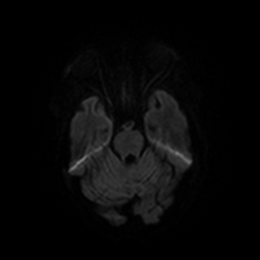
[im 29/68]
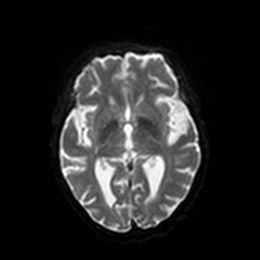
[im 39/68]
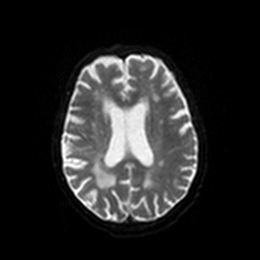
[im 48/68]
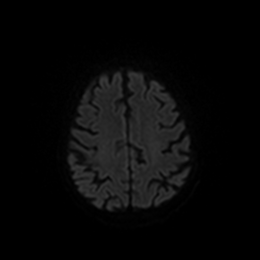
[im 58/68]
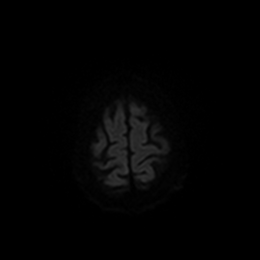
[im 68/68]
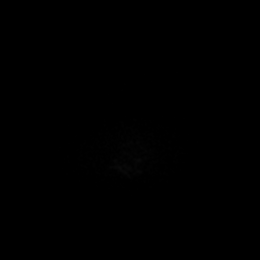

[Series 6: DWI · axial · 4.0mm · 0.88mm/px · z∈[-40,+89]mm · 4 of 33 slices shown (2 of 4)]
[im 1/33]
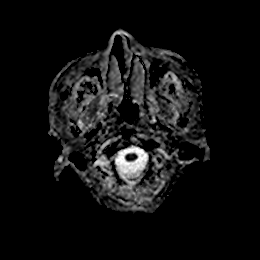
[im 11/33]
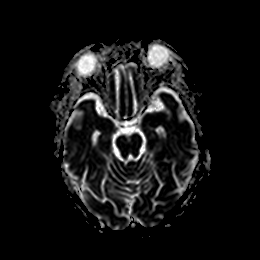
[im 22/33]
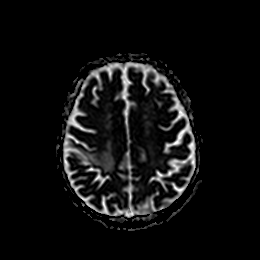
[im 33/33]
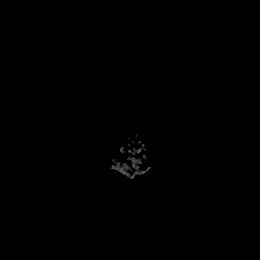

[Series 7: DWI · coronal · 4.0mm · 0.88mm/px · 7 of 72 slices shown (3 of 4)]
[im 1/72]
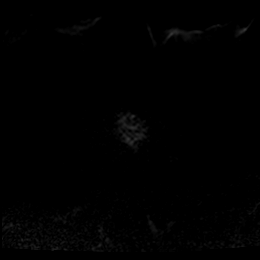
[im 12/72]
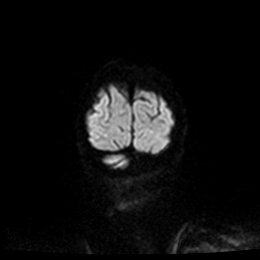
[im 24/72]
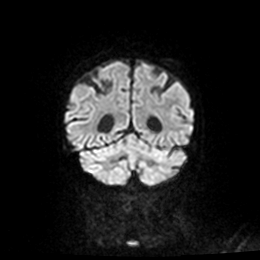
[im 36/72]
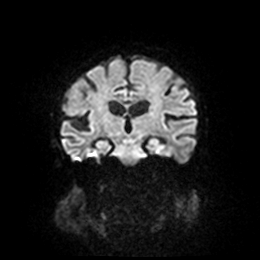
[im 48/72]
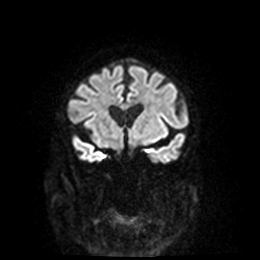
[im 60/72]
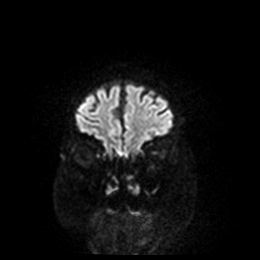
[im 72/72]
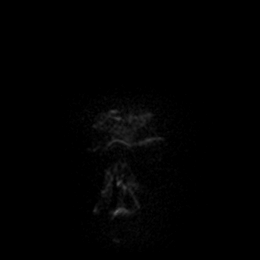

[Series 8: DWI · coronal · 4.0mm · 0.88mm/px · 3 of 35 slices shown (4 of 4)]
[im 1/35]
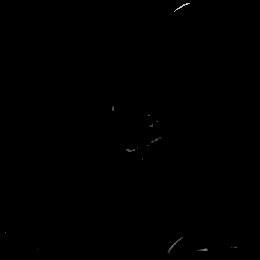
[im 18/35]
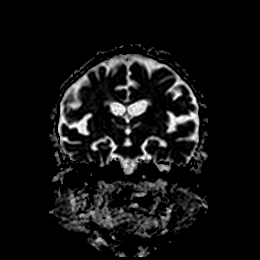
[im 35/35]
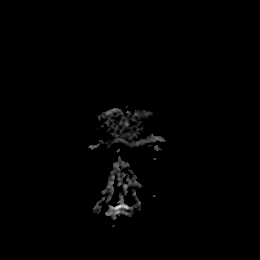

[Series 9: T1 · sagittal · 5.0mm · 0.90mm/px · 2 of 25 slices shown]
[im 1/25]
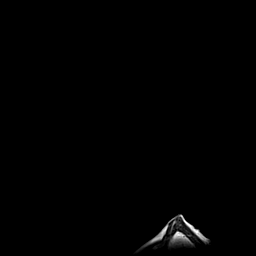
[im 25/25]
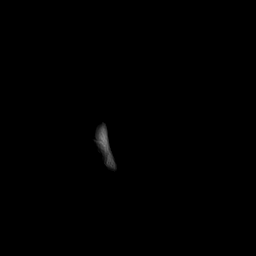

[Series 10: T2 · axial · 5.0mm · 0.72mm/px · z∈[-51,+91]mm · 2 of 25 slices shown (1 of 2)]
[im 1/25]
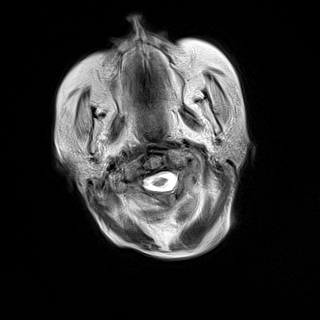
[im 25/25]
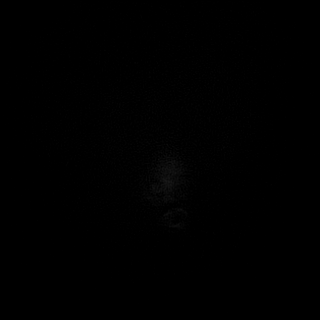

[Series 11: FLAIR · axial · 5.0mm · 0.45mm/px · z∈[-54,+88]mm · 2 of 25 slices shown]
[im 1/25]
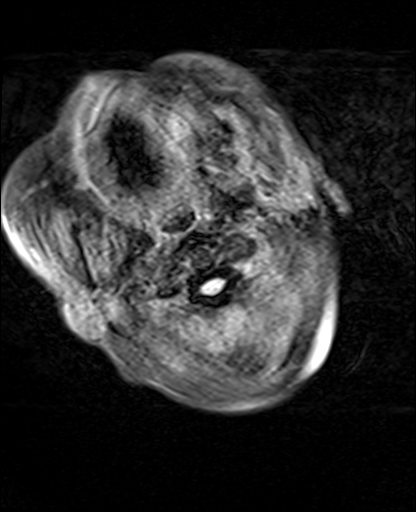
[im 25/25]
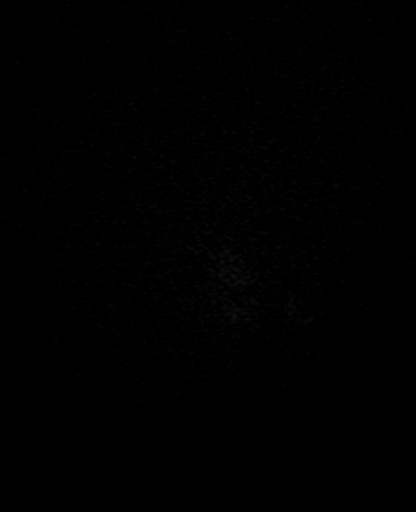

[Series 12: swi_images · axial · 3.0mm · 0.90mm/px · z∈[-68,+105]mm · 6 of 60 slices shown]
[im 1/60]
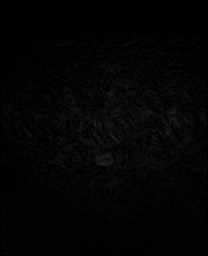
[im 12/60]
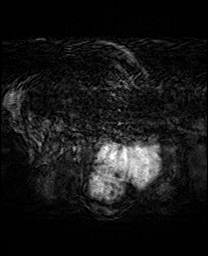
[im 24/60]
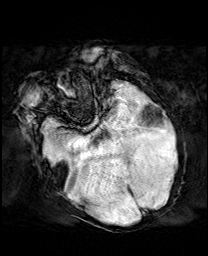
[im 36/60]
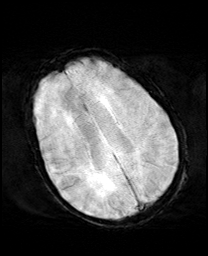
[im 48/60]
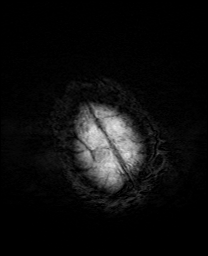
[im 60/60]
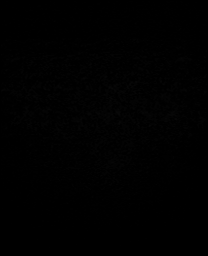

[Series 13: mip_images(sw) · axial · 24.0mm · 0.90mm/px · z∈[-58,+95]mm · 5 of 53 slices shown]
[im 1/53]
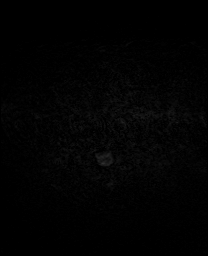
[im 14/53]
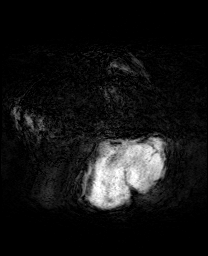
[im 27/53]
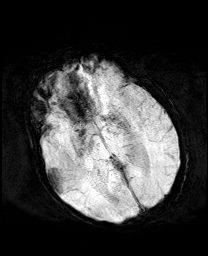
[im 40/53]
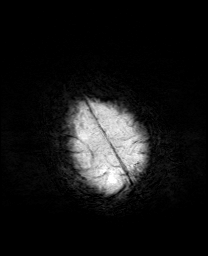
[im 53/53]
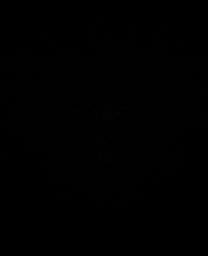

[Series 15: T2 · coronal · 5.0mm · 0.34mm/px · 3 of 29 slices shown (2 of 2)]
[im 1/29]
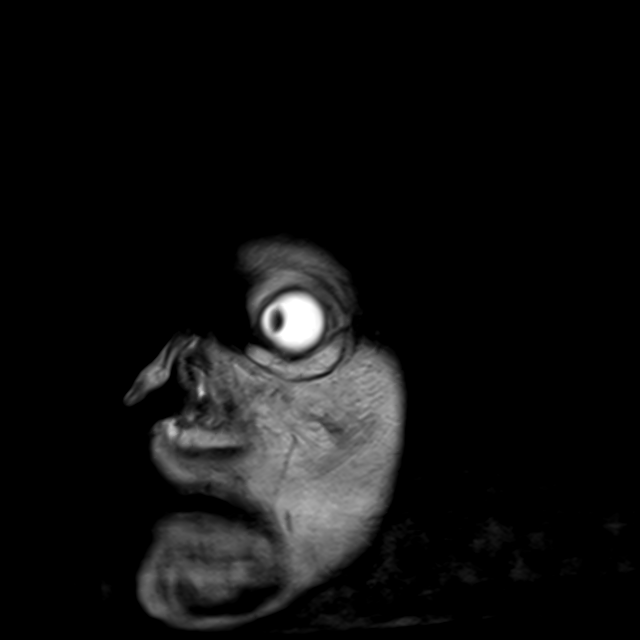
[im 15/29]
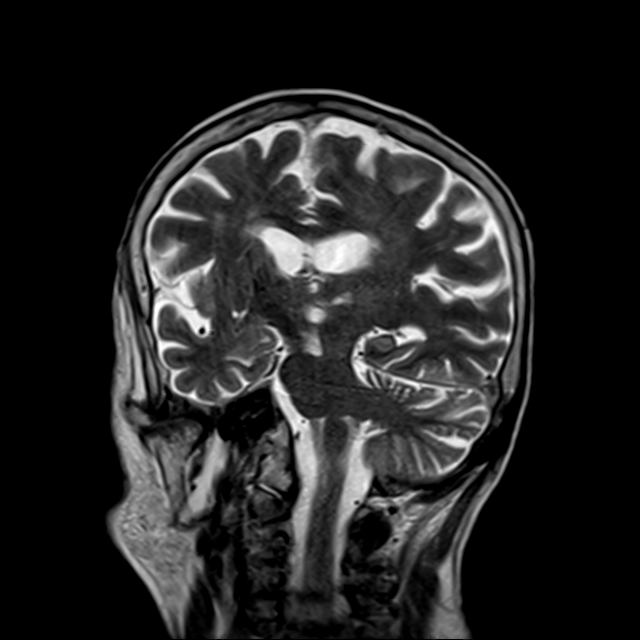
[im 29/29]
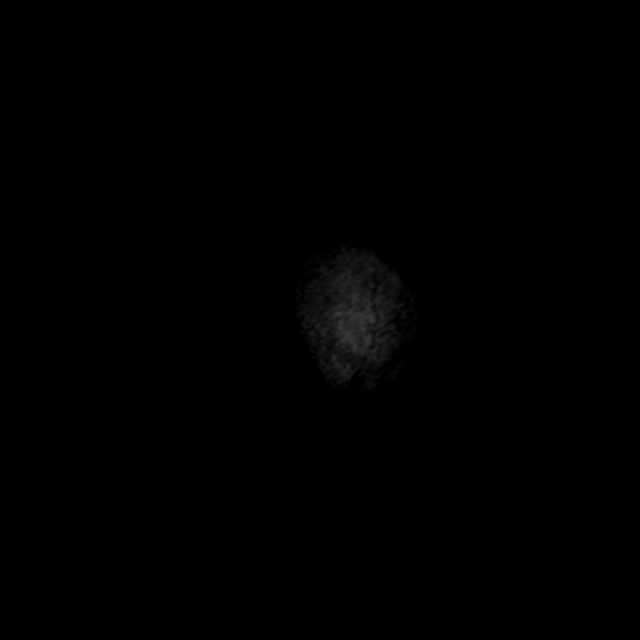

[42 of 48 positions shown; findings below may reference images not displayed]

FINDINGS: The patient was unable to remain motionless for the exam. There is
significant motion degradation. Small or subtle lesions could be
overlooked. The study is of relatively poor diagnostic quality.

Brain: No evidence for acute infarction, hemorrhage, mass lesion,
hydrocephalus, or extra-axial fluid. Generalized atrophy. T2 and
FLAIR hyperintensities in the white matter, consistent with small
vessel disease. Remote BILATERAL cerebellar infarcts. Large remote
RIGHT posterior frontal infarct.

Vascular: Flow voids are maintained in the BILATERAL carotid
arteries, basilar artery, and RIGHT vertebral artery. The LEFT
vertebral is small, and appears to terminate in PICA. The basilar is
hypoplastic due to BILATERAL fetal PCA anatomy.

Skull and upper cervical spine: Normal marrow signal. No upper
cervical lesions.

Sinuses/Orbits: No significant sinus opacity. No gross orbital
findings.

Other: None.
IMPRESSION: Significantly motion degraded scan. No acute stroke or intracranial
mass lesion.

Atrophy and small vessel disease with widespread areas of chronic
infarction. Similar appearance to priors.

## 2018-11-26 IMAGING — CT CT CHEST W/O CM
2 of 4 series · 13 of 36 positions shown, 16 images · non-contrast
Comparison: Chest CT 09/09/2018

CLINICAL DATA: Abdominal pain

IV contrast allergy..  No oral contrast per ordering JIM
EXAM:
CT CHEST, ABDOMEN AND PELVIS WITHOUT CONTRAST
TECHNIQUE: Multidetector CT imaging of the chest, abdomen and pelvis was
performed following the standard protocol without IV contrast.

[Series 2: axial st · axial · 0.75mm/px · z∈[-507,-32]mm · 10 of 113 slices shown, 13 images]
[im 9/113  mediastinal]
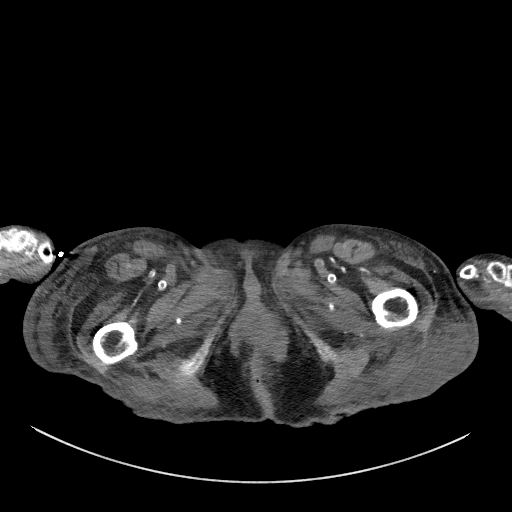
[im 9/113  lung]
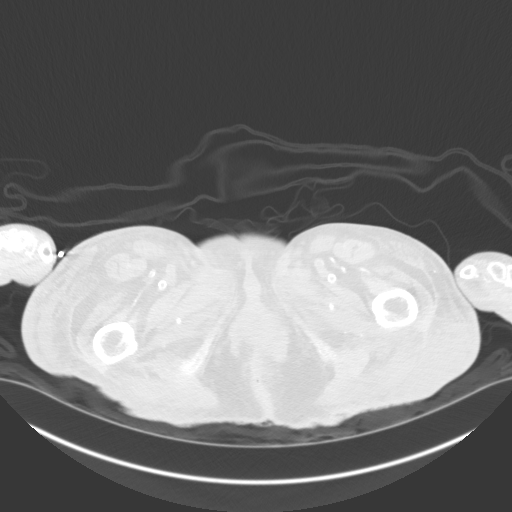
[im 18/113  lung]
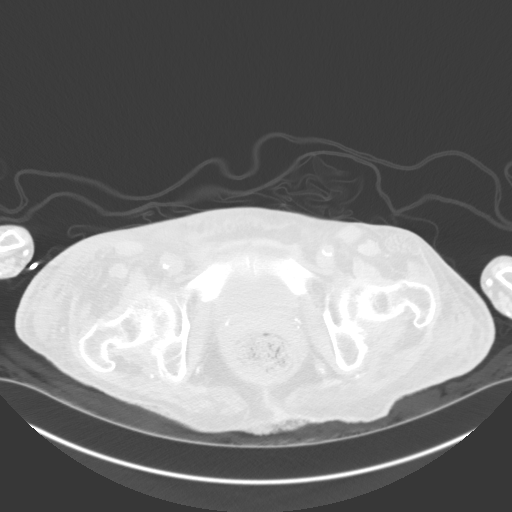
[im 35/113  lung]
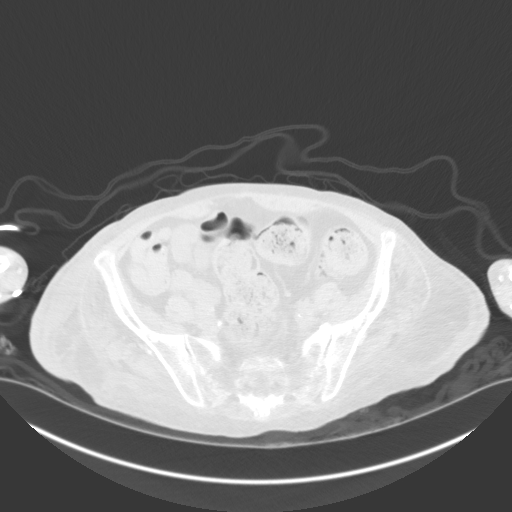
[im 44/113  lung]
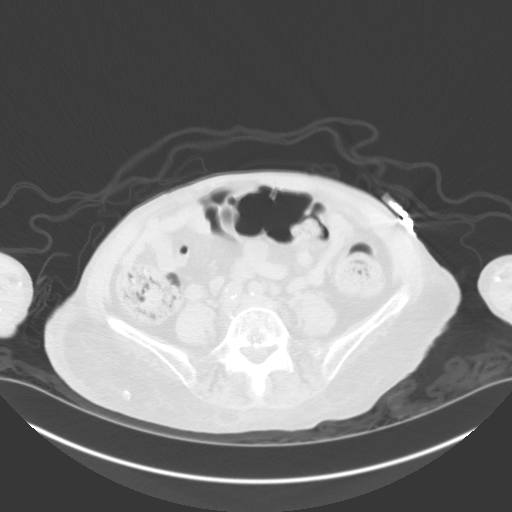
[im 52/113  mediastinal]
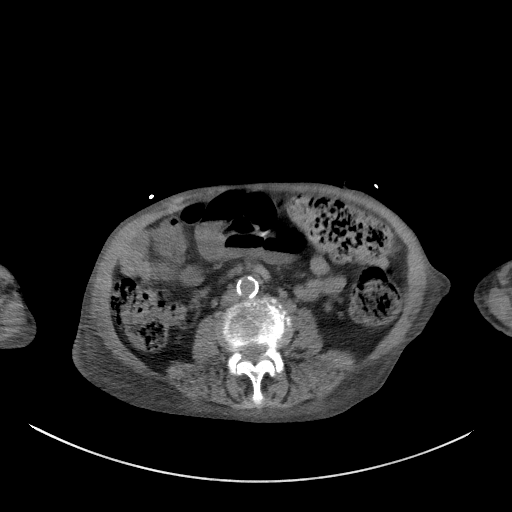
[im 52/113  lung]
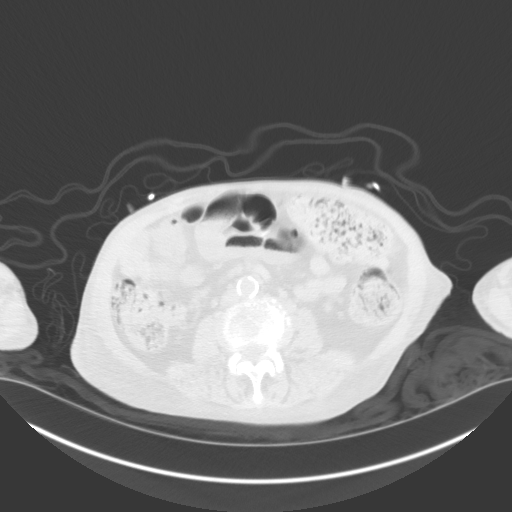
[im 61/113  lung]
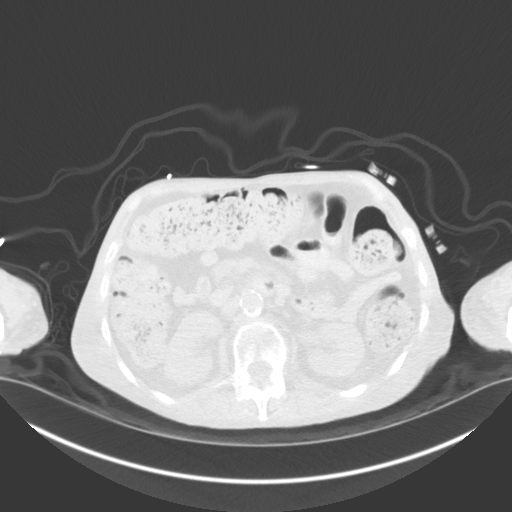
[im 69/113  lung]
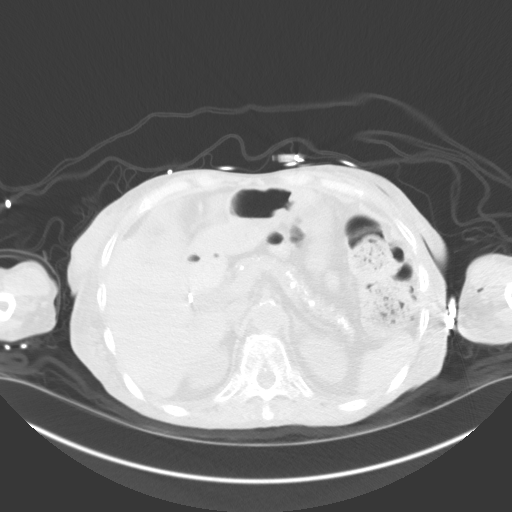
[im 87/113  lung]
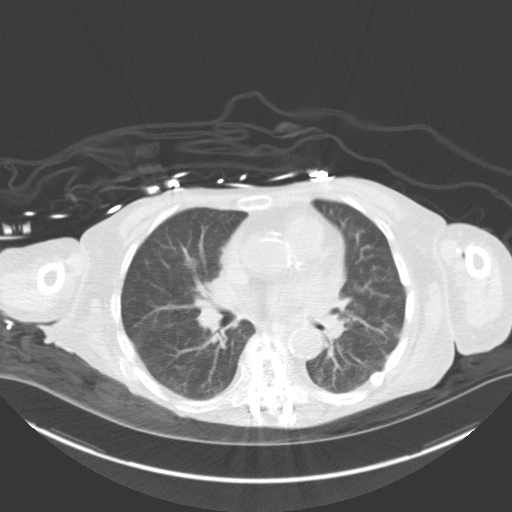
[im 95/113  mediastinal]
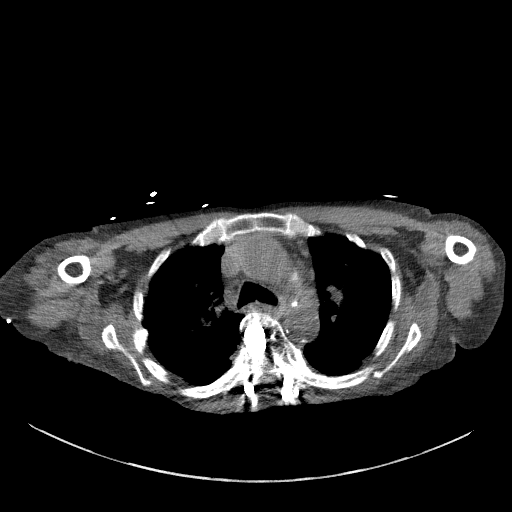
[im 95/113  lung]
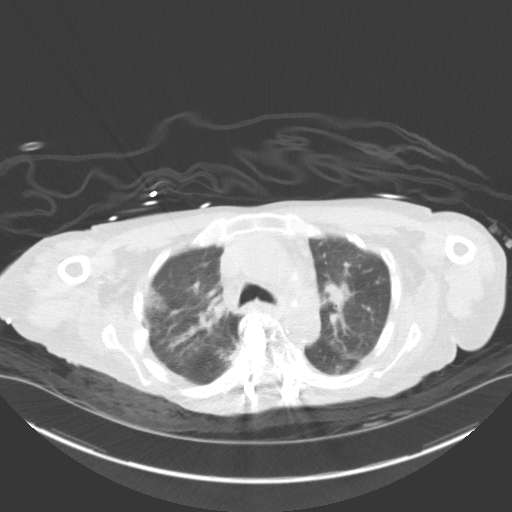
[im 104/113  lung]
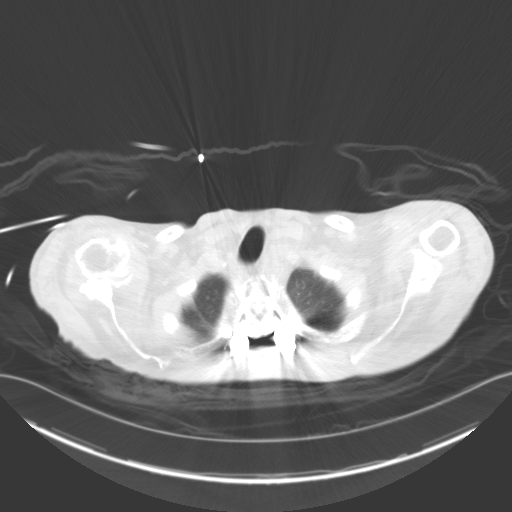

[Series 5: coronal · coronal · 0.72mm/px · 3 of 110 slices shown]
[im 22/110  lung]
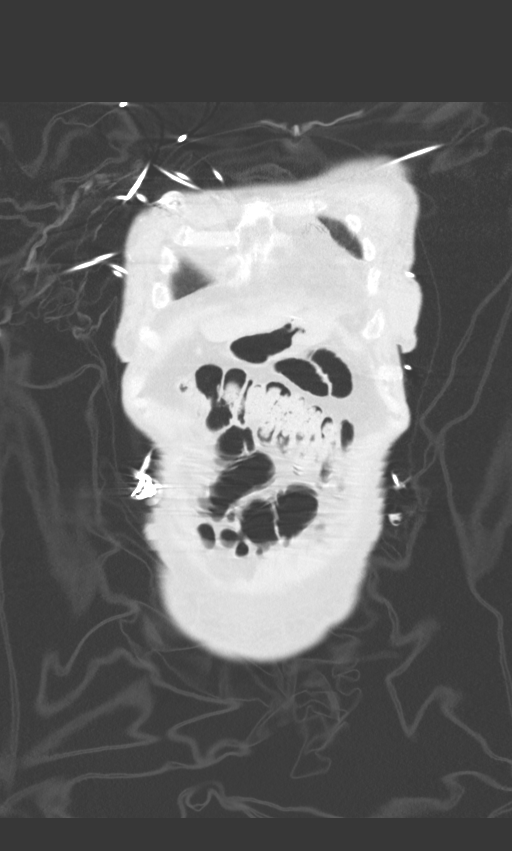
[im 44/110  lung]
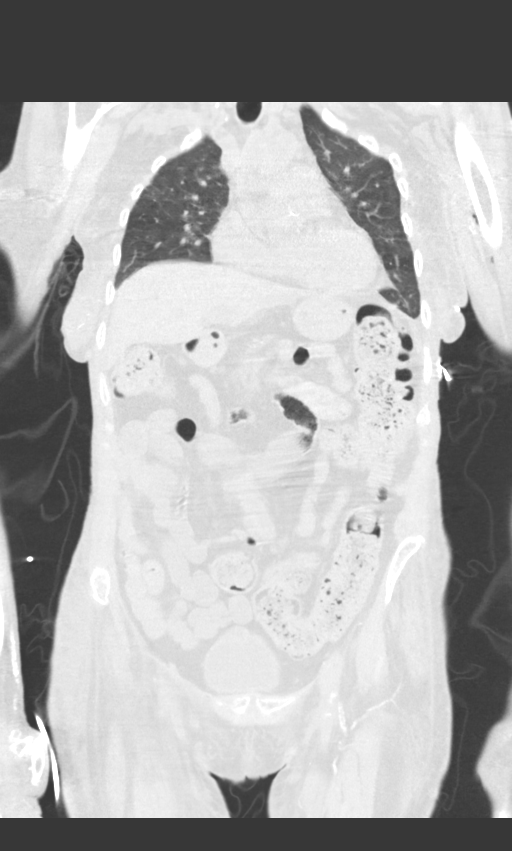
[im 66/110  lung]
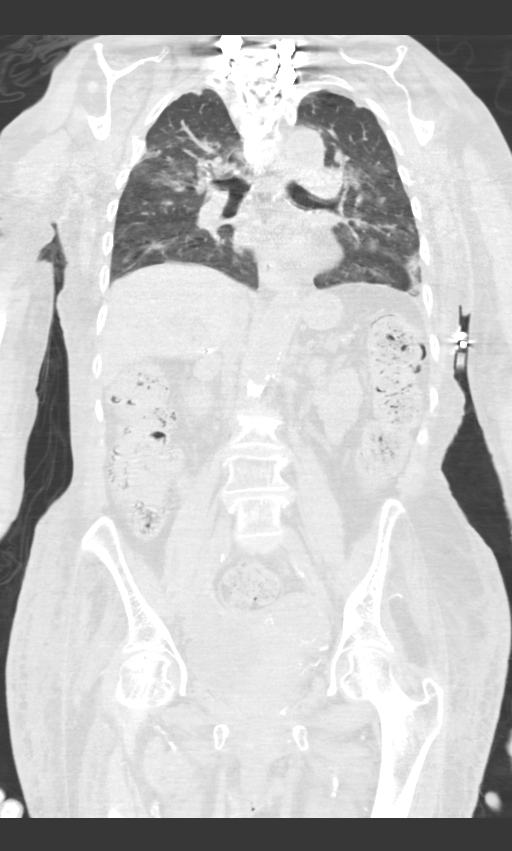

[13 of 36 positions shown; findings below may reference images not displayed]

FINDINGS: CT CHEST FINDINGS

Cardiovascular: Significant streak artifact of the mediastinum. No
pericardial effusion. No gross abnormality of the heart or great
vessels.

Mediastinum/Nodes: No axillary or supraclavicular adenopathy. No
mediastinal hilar adenopathy.

Lungs/Pleura: Bilateral perihilar patchy ground-glass densities
these ground-glass densities or more nodular in shape in the
inferior lingula measuring up to 12 mm

No pleural fluid. No pneumothorax. Interlobular septal thickening at
the lung bases.

Musculoskeletal: Posterior thoracic fusion.. Severe degenerative
arthropathy at the glenohumeral joint. There is a well-circumscribed
lytic lesion the proximal femur humerus which is enlarging over
comparison exams from 8804. Lesion is seen on axial image [DATE] and
on the CT scout measuring approximately 12 mm..

CT ABDOMEN AND PELVIS FINDINGS

Hepatobiliary: No focal hepatic lesion on noncontrast exam.

Pancreas: Chronic calcifications throughout the pancreas.

Spleen: Normal spleen

Adrenals/urinary tract: Adrenal glands and kidneys are normal. The
ureters and bladder normal.

Stomach/Bowel: Stomach, small bowel, appendix, and cecum are normal.
The colon and rectosigmoid colon are normal.

Vascular/Lymphatic: Abdominal aorta is normal caliber with
atherosclerotic calcification. There is no retroperitoneal or
periportal lymphadenopathy. No pelvic lymphadenopathy.

Reproductive: Uterus and ovaries normal.

Other: No free fluid.  Anasarca of the soft tissues.

Musculoskeletal: No aggressive osseous lesion
IMPRESSION: Chest Impression:

1. Bilateral patchy ground-glass nodular densities concerning for
multifocal pulmonary infection..
2. Mild interlobular septal thickening in the lung bases suggests
interstitial edema
3. No lymphadenopathy.
4. Severe degenerative arthropathy of the RIGHT shoulder.
5. Enlarging lytic lesion in the proximal RIGHT humerus differential
including infection, erosive arthropathy or neoplasm. Consider
dedicated films of the RIGHT shoulder.

Abdomen / Pelvis Impression:

1. Anasarca of the soft tissues.
2.  Aortic Atherosclerosis ([id number]).

## 2018-11-26 IMAGING — CT CT HEAD W/O CM
3 series · 15 of 47 positions shown, 18 images · non-contrast
Comparison: Brain MRI 09/29/2018. Head CT 09/26/2018, and earlier.

CLINICAL DATA: 62-year-old female with lethargy, vomiting, sepsis.

EXAM:
CT HEAD WITHOUT CONTRAST
TECHNIQUE: Contiguous axial images were obtained from the base of the skull
through the vertex without intravenous contrast.

[Series 2: head wo · axial · 0.38mm/px · z∈[-147,-22]mm · 9 of 30 slices shown, 12 images]
[im 3/30  brain]
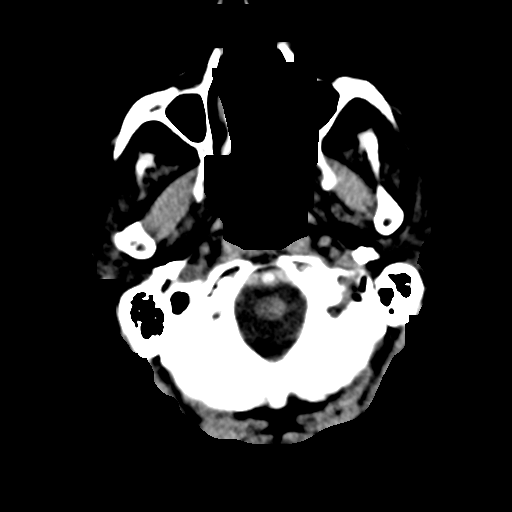
[im 3/30  bone]
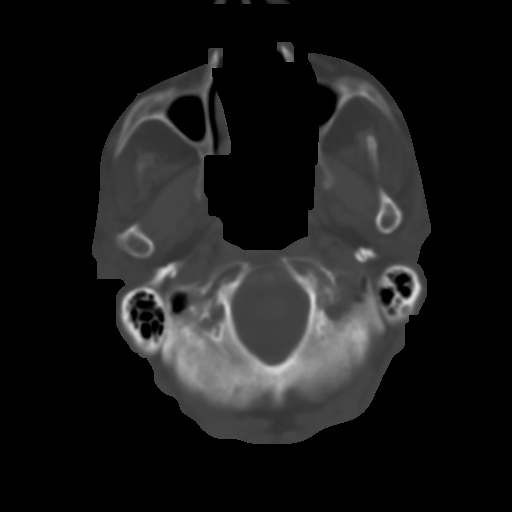
[im 6/30  brain]
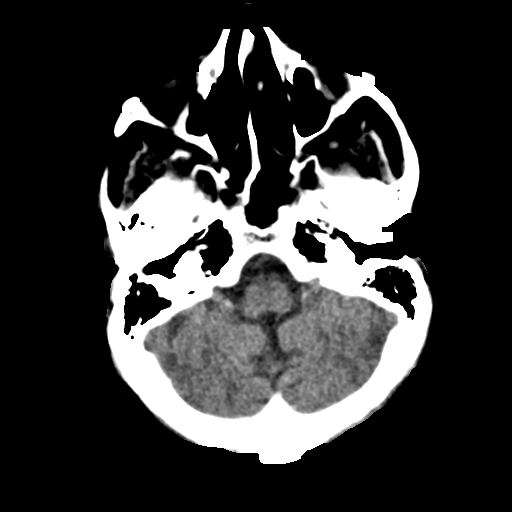
[im 9/30  brain]
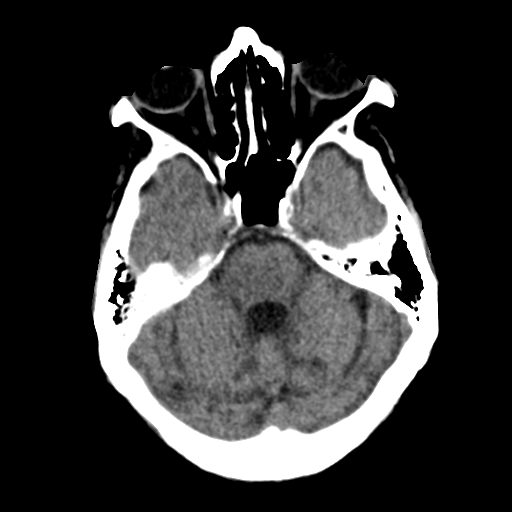
[im 12/30  brain]
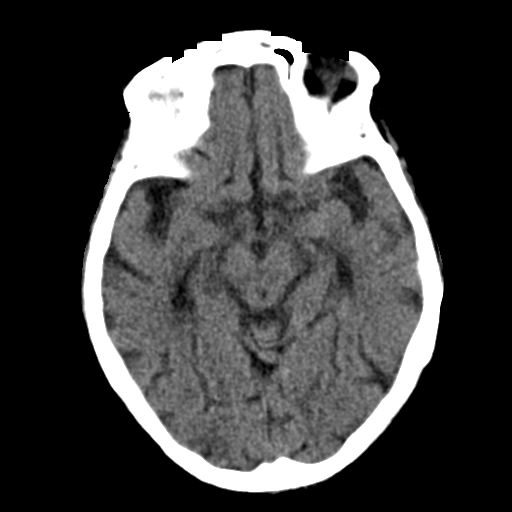
[im 16/30  brain]
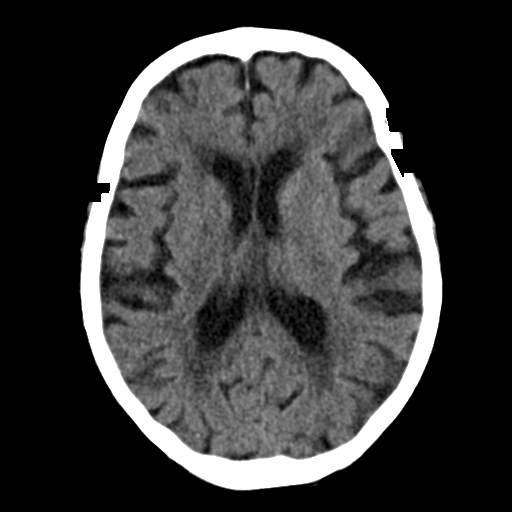
[im 16/30  bone]
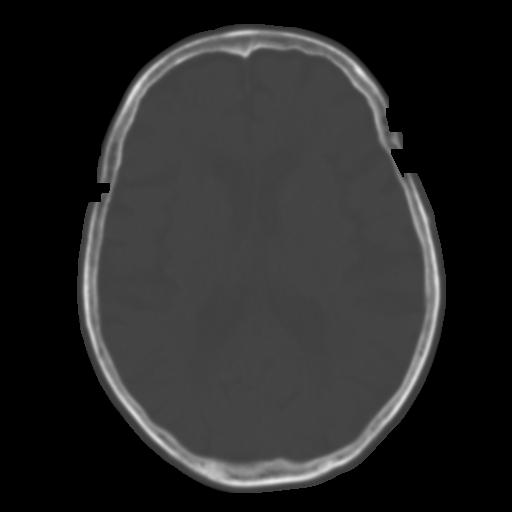
[im 19/30  brain]
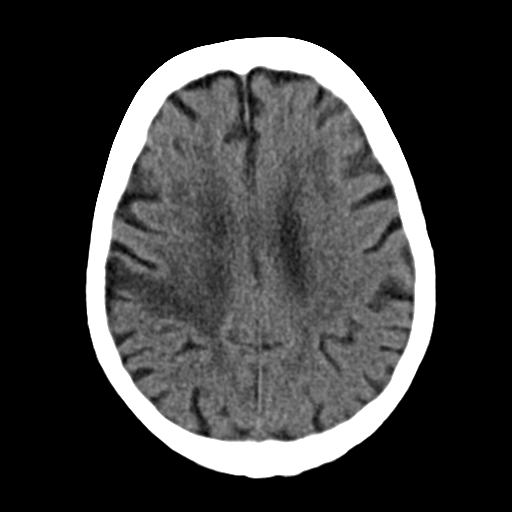
[im 22/30  brain]
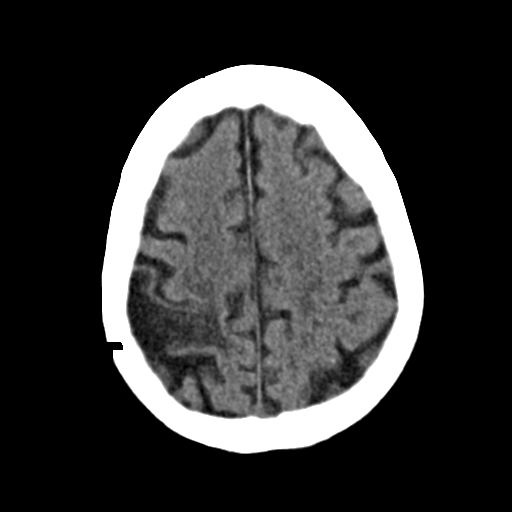
[im 25/30  brain]
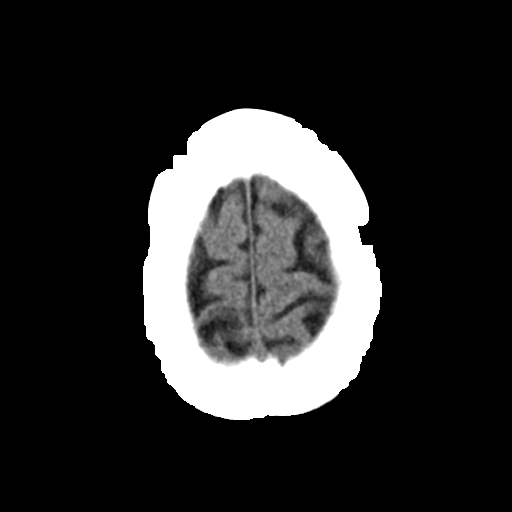
[im 28/30  brain]
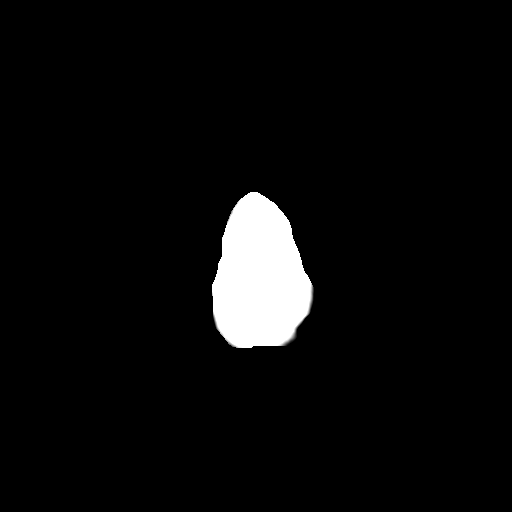
[im 28/30  bone]
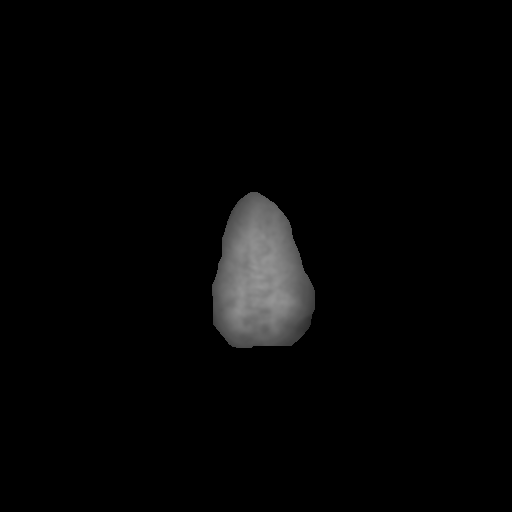

[Series 4: coronal soft · coronal · 0.30mm/px · 3 of 63 slices shown]
[im 21/63  brain]
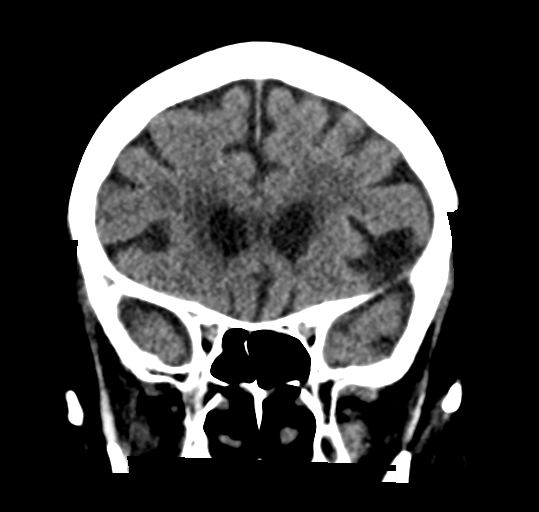
[im 28/63  brain]
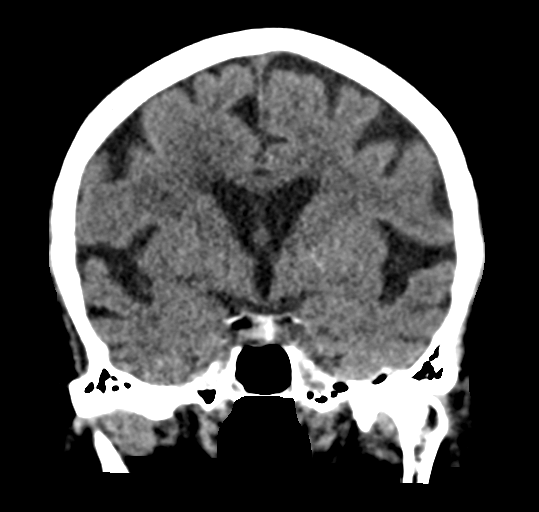
[im 35/63  brain]
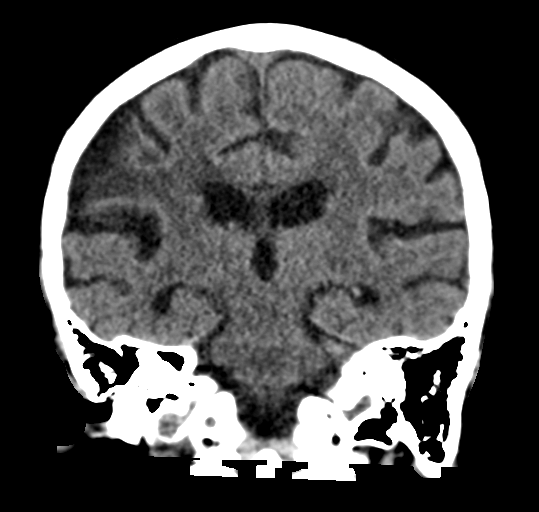

[Series 5: sag soft · sagittal · 0.30mm/px · 3 of 49 slices shown]
[im 17/49  brain]
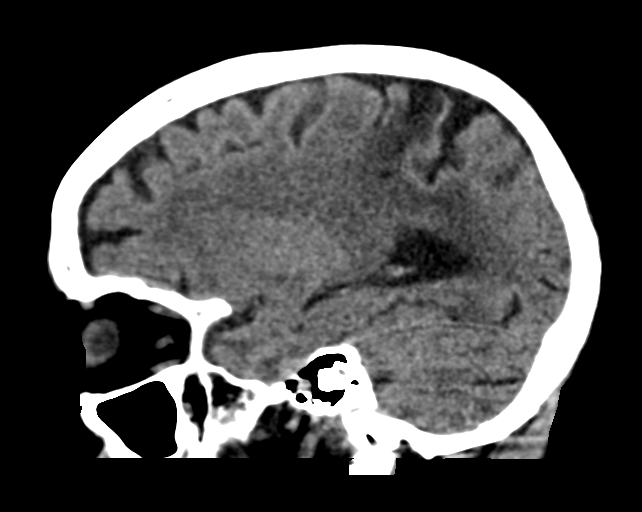
[im 25/49  brain]
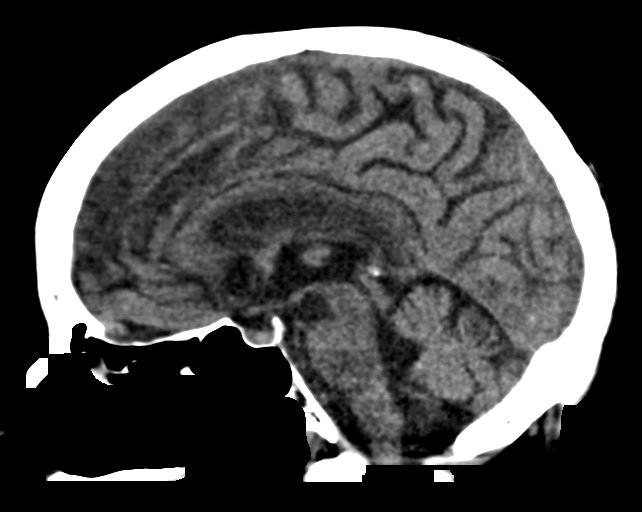
[im 33/49  brain]
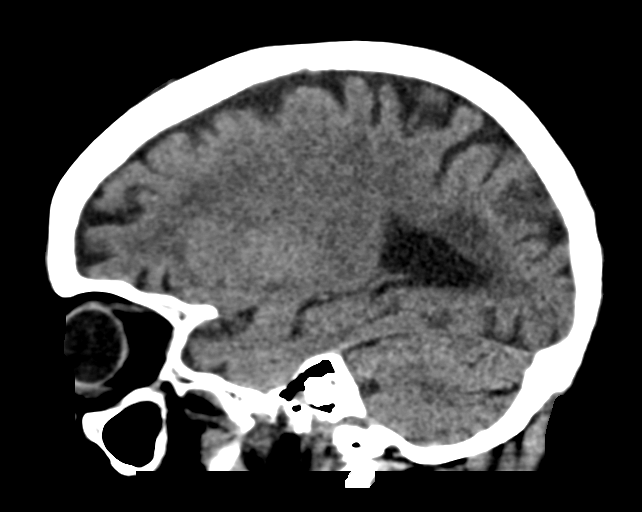

[15 of 47 positions shown; findings below may reference images not displayed]

FINDINGS: Brain: Right MCA territory encephalomalacia with Patchy and
confluent bilateral cerebral white matter hypodensity, and bilateral
thalamic heterogeneity. Superimposed bilateral small chronic
cerebellar infarcts, better demonstrated by MRI.

No midline shift, ventriculomegaly, mass effect, evidence of mass
lesion, intracranial hemorrhage or evidence of cortically based
acute infarction.

Vascular: Calcified atherosclerosis at the skull base. No suspicious
intracranial vascular hyperdensity.

Skull: Osteopenia.  No acute osseous abnormality identified.

Sinuses/Orbits: Visualized paranasal sinuses and mastoids are stable
and well pneumatized.

Other: No acute orbit or scalp soft tissue finding.
IMPRESSION: 1. No acute intracranial abnormality.
2. Stable appearance of advanced chronic ischemic disease.

## 2018-11-28 IMAGING — DX DG CHEST 1V PORT
1 series · 1 of 1 positions shown · non-contrast
Comparison: 10/10/2018

CLINICAL DATA: Shortness of breath

EXAM:
PORTABLE CHEST 1 VIEW

[chest ap]
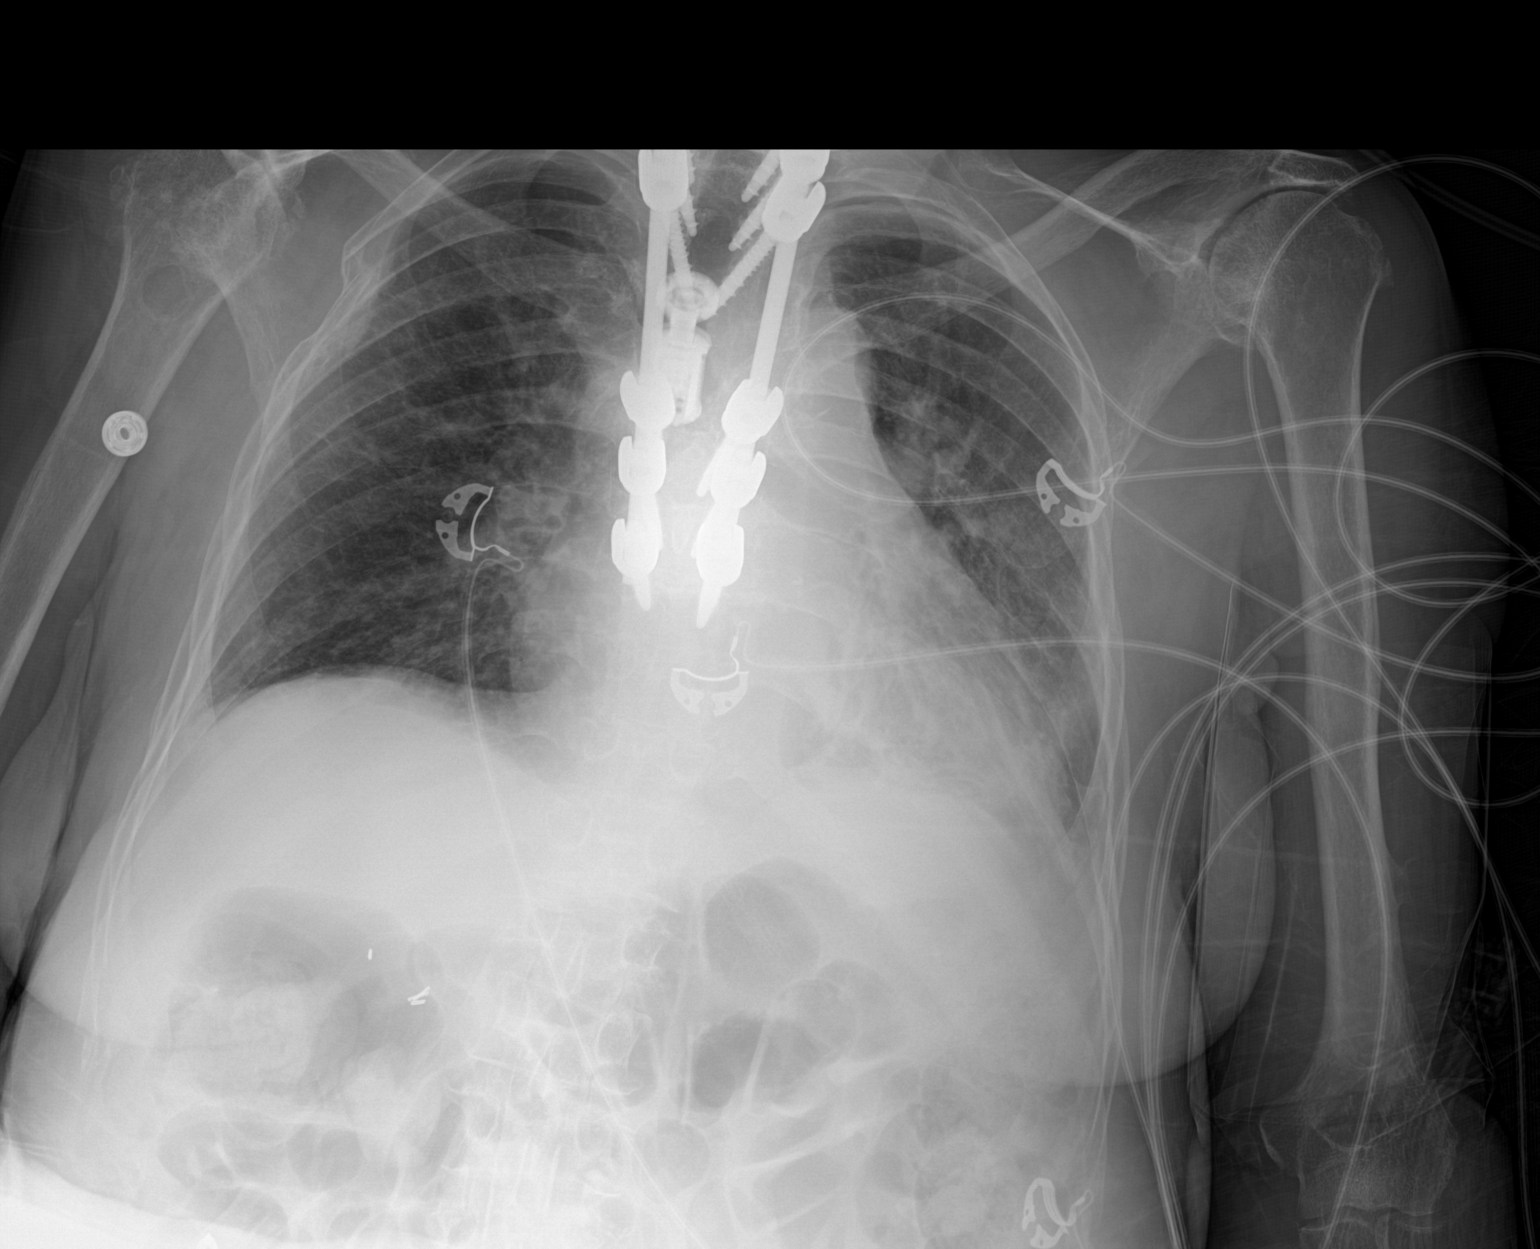

[1 of 1 positions shown; findings below may reference images not displayed]

FINDINGS: Postoperative changes in the cervicothoracic spine. Shallow
inspiration. Cardiac enlargement with mild vascular congestion,
progressing since previous study. Atelectasis or infiltration in the
left lung base behind the heart. No blunting of costophrenic angles.
No pneumothorax. Old fracture deformities of the right shoulder and
right upper ribs. Degenerative changes in the left shoulder.
IMPRESSION: Cardiac enlargement with mild vascular congestion progressing since
previous study. Developing atelectasis or infiltration in the left
lung base.

## 2018-12-29 ENCOUNTER — Inpatient Hospital Stay (HOSPITAL_COMMUNITY)
Admission: EM | Admit: 2018-12-29 | Discharge: 2019-01-16 | DRG: 871 | Disposition: A | Discharge status: Home Health | Payer: Medicaid Other | Attending: Internal Medicine | Admitting: Internal Medicine

## 2018-12-29 ENCOUNTER — Emergency Department (HOSPITAL_COMMUNITY): Payer: Medicaid Other

## 2018-12-29 ENCOUNTER — Encounter (HOSPITAL_COMMUNITY): Payer: Self-pay

## 2018-12-29 ENCOUNTER — Other Ambulatory Visit: Payer: Self-pay

## 2018-12-29 DIAGNOSIS — A044 Other intestinal Escherichia coli infections: Secondary | ICD-10-CM | POA: Diagnosis present

## 2018-12-29 DIAGNOSIS — Z515 Encounter for palliative care: Secondary | ICD-10-CM | POA: Diagnosis not present

## 2018-12-29 DIAGNOSIS — Z79899 Other long term (current) drug therapy: Secondary | ICD-10-CM

## 2018-12-29 DIAGNOSIS — Z888 Allergy status to other drugs, medicaments and biological substances status: Secondary | ICD-10-CM

## 2018-12-29 DIAGNOSIS — Q211 Atrial septal defect: Secondary | ICD-10-CM | POA: Diagnosis not present

## 2018-12-29 DIAGNOSIS — G9340 Encephalopathy, unspecified: Secondary | ICD-10-CM | POA: Diagnosis not present

## 2018-12-29 DIAGNOSIS — R7989 Other specified abnormal findings of blood chemistry: Secondary | ICD-10-CM | POA: Diagnosis not present

## 2018-12-29 DIAGNOSIS — R1110 Vomiting, unspecified: Secondary | ICD-10-CM

## 2018-12-29 DIAGNOSIS — T380X5A Adverse effect of glucocorticoids and synthetic analogues, initial encounter: Secondary | ICD-10-CM | POA: Diagnosis not present

## 2018-12-29 DIAGNOSIS — R0609 Other forms of dyspnea: Secondary | ICD-10-CM | POA: Diagnosis not present

## 2018-12-29 DIAGNOSIS — A419 Sepsis, unspecified organism: Secondary | ICD-10-CM

## 2018-12-29 DIAGNOSIS — E875 Hyperkalemia: Secondary | ICD-10-CM | POA: Diagnosis not present

## 2018-12-29 DIAGNOSIS — F19239 Other psychoactive substance dependence with withdrawal, unspecified: Secondary | ICD-10-CM | POA: Diagnosis not present

## 2018-12-29 DIAGNOSIS — I10 Essential (primary) hypertension: Secondary | ICD-10-CM | POA: Diagnosis not present

## 2018-12-29 DIAGNOSIS — R0902 Hypoxemia: Secondary | ICD-10-CM | POA: Diagnosis not present

## 2018-12-29 DIAGNOSIS — K219 Gastro-esophageal reflux disease without esophagitis: Secondary | ICD-10-CM | POA: Diagnosis not present

## 2018-12-29 DIAGNOSIS — Z993 Dependence on wheelchair: Secondary | ICD-10-CM

## 2018-12-29 DIAGNOSIS — K861 Other chronic pancreatitis: Secondary | ICD-10-CM | POA: Diagnosis present

## 2018-12-29 DIAGNOSIS — R69 Illness, unspecified: Secondary | ICD-10-CM

## 2018-12-29 DIAGNOSIS — E872 Acidosis: Secondary | ICD-10-CM | POA: Diagnosis present

## 2018-12-29 DIAGNOSIS — I2510 Atherosclerotic heart disease of native coronary artery without angina pectoris: Secondary | ICD-10-CM | POA: Diagnosis not present

## 2018-12-29 DIAGNOSIS — R109 Unspecified abdominal pain: Secondary | ICD-10-CM

## 2018-12-29 DIAGNOSIS — D696 Thrombocytopenia, unspecified: Secondary | ICD-10-CM | POA: Diagnosis present

## 2018-12-29 DIAGNOSIS — N390 Urinary tract infection, site not specified: Secondary | ICD-10-CM | POA: Diagnosis not present

## 2018-12-29 DIAGNOSIS — D649 Anemia, unspecified: Secondary | ICD-10-CM | POA: Diagnosis present

## 2018-12-29 DIAGNOSIS — G40909 Epilepsy, unspecified, not intractable, without status epilepticus: Secondary | ICD-10-CM | POA: Diagnosis not present

## 2018-12-29 DIAGNOSIS — Z9049 Acquired absence of other specified parts of digestive tract: Secondary | ICD-10-CM

## 2018-12-29 DIAGNOSIS — R6521 Severe sepsis with septic shock: Secondary | ICD-10-CM | POA: Diagnosis present

## 2018-12-29 DIAGNOSIS — R0602 Shortness of breath: Secondary | ICD-10-CM | POA: Diagnosis not present

## 2018-12-29 DIAGNOSIS — L89151 Pressure ulcer of sacral region, stage 1: Secondary | ICD-10-CM | POA: Diagnosis not present

## 2018-12-29 DIAGNOSIS — I110 Hypertensive heart disease with heart failure: Secondary | ICD-10-CM | POA: Diagnosis not present

## 2018-12-29 DIAGNOSIS — Z1621 Resistance to vancomycin: Secondary | ICD-10-CM | POA: Diagnosis present

## 2018-12-29 DIAGNOSIS — E876 Hypokalemia: Secondary | ICD-10-CM | POA: Diagnosis not present

## 2018-12-29 DIAGNOSIS — J111 Influenza due to unidentified influenza virus with other respiratory manifestations: Secondary | ICD-10-CM | POA: Diagnosis not present

## 2018-12-29 DIAGNOSIS — R7881 Bacteremia: Secondary | ICD-10-CM | POA: Diagnosis not present

## 2018-12-29 DIAGNOSIS — G894 Chronic pain syndrome: Secondary | ICD-10-CM | POA: Diagnosis not present

## 2018-12-29 DIAGNOSIS — I5033 Acute on chronic diastolic (congestive) heart failure: Secondary | ICD-10-CM

## 2018-12-29 DIAGNOSIS — Z6821 Body mass index (BMI) 21.0-21.9, adult: Secondary | ICD-10-CM

## 2018-12-29 DIAGNOSIS — B952 Enterococcus as the cause of diseases classified elsewhere: Secondary | ICD-10-CM | POA: Diagnosis not present

## 2018-12-29 DIAGNOSIS — I480 Paroxysmal atrial fibrillation: Secondary | ICD-10-CM | POA: Diagnosis present

## 2018-12-29 DIAGNOSIS — Y95 Nosocomial condition: Secondary | ICD-10-CM | POA: Diagnosis not present

## 2018-12-29 DIAGNOSIS — Z8249 Family history of ischemic heart disease and other diseases of the circulatory system: Secondary | ICD-10-CM

## 2018-12-29 DIAGNOSIS — Z7901 Long term (current) use of anticoagulants: Secondary | ICD-10-CM

## 2018-12-29 DIAGNOSIS — I5031 Acute diastolic (congestive) heart failure: Secondary | ICD-10-CM | POA: Diagnosis not present

## 2018-12-29 DIAGNOSIS — M069 Rheumatoid arthritis, unspecified: Secondary | ICD-10-CM | POA: Diagnosis present

## 2018-12-29 DIAGNOSIS — Y92230 Patient room in hospital as the place of occurrence of the external cause: Secondary | ICD-10-CM | POA: Diagnosis not present

## 2018-12-29 DIAGNOSIS — E11649 Type 2 diabetes mellitus with hypoglycemia without coma: Secondary | ICD-10-CM | POA: Diagnosis not present

## 2018-12-29 DIAGNOSIS — I248 Other forms of acute ischemic heart disease: Secondary | ICD-10-CM | POA: Diagnosis present

## 2018-12-29 DIAGNOSIS — E1121 Type 2 diabetes mellitus with diabetic nephropathy: Secondary | ICD-10-CM

## 2018-12-29 DIAGNOSIS — M1990 Unspecified osteoarthritis, unspecified site: Secondary | ICD-10-CM | POA: Diagnosis present

## 2018-12-29 DIAGNOSIS — Z7952 Long term (current) use of systemic steroids: Secondary | ICD-10-CM

## 2018-12-29 DIAGNOSIS — J810 Acute pulmonary edema: Secondary | ICD-10-CM | POA: Diagnosis not present

## 2018-12-29 DIAGNOSIS — R0600 Dyspnea, unspecified: Secondary | ICD-10-CM

## 2018-12-29 DIAGNOSIS — Z91041 Radiographic dye allergy status: Secondary | ICD-10-CM

## 2018-12-29 DIAGNOSIS — Z87891 Personal history of nicotine dependence: Secondary | ICD-10-CM

## 2018-12-29 DIAGNOSIS — E669 Obesity, unspecified: Secondary | ICD-10-CM | POA: Diagnosis present

## 2018-12-29 DIAGNOSIS — Z8673 Personal history of transient ischemic attack (TIA), and cerebral infarction without residual deficits: Secondary | ICD-10-CM

## 2018-12-29 DIAGNOSIS — Z1612 Extended spectrum beta lactamase (ESBL) resistance: Secondary | ICD-10-CM | POA: Diagnosis not present

## 2018-12-29 DIAGNOSIS — Z7189 Other specified counseling: Secondary | ICD-10-CM | POA: Diagnosis not present

## 2018-12-29 DIAGNOSIS — N179 Acute kidney failure, unspecified: Secondary | ICD-10-CM | POA: Diagnosis present

## 2018-12-29 DIAGNOSIS — Z8701 Personal history of pneumonia (recurrent): Secondary | ICD-10-CM

## 2018-12-29 DIAGNOSIS — T383X5A Adverse effect of insulin and oral hypoglycemic [antidiabetic] drugs, initial encounter: Secondary | ICD-10-CM | POA: Diagnosis present

## 2018-12-29 DIAGNOSIS — R579 Shock, unspecified: Secondary | ICD-10-CM | POA: Diagnosis present

## 2018-12-29 DIAGNOSIS — A4151 Sepsis due to Escherichia coli [E. coli]: Principal | ICD-10-CM | POA: Diagnosis present

## 2018-12-29 DIAGNOSIS — J9601 Acute respiratory failure with hypoxia: Secondary | ICD-10-CM | POA: Diagnosis not present

## 2018-12-29 DIAGNOSIS — G8929 Other chronic pain: Secondary | ICD-10-CM | POA: Diagnosis not present

## 2018-12-29 DIAGNOSIS — E1165 Type 2 diabetes mellitus with hyperglycemia: Secondary | ICD-10-CM | POA: Diagnosis not present

## 2018-12-29 DIAGNOSIS — Z7401 Bed confinement status: Secondary | ICD-10-CM

## 2018-12-29 LAB — BASIC METABOLIC PANEL
Anion gap: 10 (ref 5–15)
BUN: 9 mg/dL (ref 6–20)
CO2: 12 mmol/L — ABNORMAL LOW (ref 22–32)
Calcium: 7.1 mg/dL — ABNORMAL LOW (ref 8.9–10.3)
Chloride: 114 mmol/L — ABNORMAL HIGH (ref 98–111)
Creatinine, Ser: 1.18 mg/dL — ABNORMAL HIGH (ref 0.44–1.00)
GFR calc Af Amer: 58 mL/min — ABNORMAL LOW (ref >60)
GFR calc non Af Amer: 50 mL/min — ABNORMAL LOW (ref >60)
Glucose, Bld: 96 mg/dL (ref 70–99)
Potassium: 3.5 mmol/L (ref 3.5–5.1)
SODIUM: 136 mmol/L (ref 135–145)

## 2018-12-29 LAB — I-STAT TROPONIN, ED: Troponin i, poc: 0.12 ng/mL (ref 0.00–0.08)

## 2018-12-29 LAB — CBC WITH DIFFERENTIAL/PLATELET
Abs Immature Granulocytes: 1.96 10*3/uL — ABNORMAL HIGH (ref 0.00–0.07)
BASOS PCT: 0 %
Basophils Absolute: 0 10*3/uL (ref 0.0–0.1)
Eosinophils Absolute: 0.5 10*3/uL (ref 0.0–0.5)
Eosinophils Relative: 2 %
HEMATOCRIT: 22.6 % — AB (ref 36.0–46.0)
HEMOGLOBIN: 7.2 g/dL — AB (ref 12.0–15.0)
Immature Granulocytes: 9 %
Lymphocytes Relative: 17 %
Lymphs Abs: 3.8 10*3/uL (ref 0.7–4.0)
MCH: 26.5 pg (ref 26.0–34.0)
MCHC: 31.9 g/dL (ref 30.0–36.0)
MCV: 83.1 fL (ref 80.0–100.0)
Monocytes Absolute: 1 10*3/uL (ref 0.1–1.0)
Monocytes Relative: 5 %
Neutro Abs: 15 10*3/uL — ABNORMAL HIGH (ref 1.7–7.7)
Neutrophils Relative %: 67 %
Platelets: UNDETERMINED 10*3/uL (ref 150–400)
RBC: 2.72 MIL/uL — ABNORMAL LOW (ref 3.87–5.11)
RDW: 16.1 % — ABNORMAL HIGH (ref 11.5–15.5)
Smear Review: UNDETERMINED
WBC Morphology: INCREASED
WBC: 22.4 10*3/uL — ABNORMAL HIGH (ref 4.0–10.5)
nRBC: 0 % (ref 0.0–0.2)

## 2018-12-29 LAB — TSH: TSH: 1.744 u[IU]/mL (ref 0.350–4.500)

## 2018-12-29 LAB — URINALYSIS, ROUTINE W REFLEX MICROSCOPIC
Bilirubin Urine: NEGATIVE
Glucose, UA: NEGATIVE mg/dL
Ketones, ur: NEGATIVE mg/dL
Nitrite: NEGATIVE
Specific Gravity, Urine: 1.01 (ref 1.005–1.030)
pH: 7 (ref 5.0–8.0)

## 2018-12-29 LAB — COMPREHENSIVE METABOLIC PANEL
ALT: 24 U/L (ref 0–44)
AST: 66 U/L — ABNORMAL HIGH (ref 15–41)
Albumin: 1.7 g/dL — ABNORMAL LOW (ref 3.5–5.0)
Alkaline Phosphatase: 212 U/L — ABNORMAL HIGH (ref 38–126)
Anion gap: 6 (ref 5–15)
BUN: 11 mg/dL (ref 6–20)
CO2: 15 mmol/L — ABNORMAL LOW (ref 22–32)
Calcium: 7.5 mg/dL — ABNORMAL LOW (ref 8.9–10.3)
Chloride: 119 mmol/L — ABNORMAL HIGH (ref 98–111)
Creatinine, Ser: 1.39 mg/dL — ABNORMAL HIGH (ref 0.44–1.00)
GFR calc Af Amer: 48 mL/min — ABNORMAL LOW (ref >60)
GFR calc non Af Amer: 41 mL/min — ABNORMAL LOW (ref >60)
Glucose, Bld: 66 mg/dL — ABNORMAL LOW (ref 70–99)
Potassium: 2.7 mmol/L — CL (ref 3.5–5.1)
Sodium: 140 mmol/L (ref 135–145)
TOTAL PROTEIN: 4.5 g/dL — AB (ref 6.5–8.1)
Total Bilirubin: 1.9 mg/dL — ABNORMAL HIGH (ref 0.3–1.2)

## 2018-12-29 LAB — MRSA PCR SCREENING: MRSA by PCR: NEGATIVE

## 2018-12-29 LAB — TYPE AND SCREEN
ABO/RH(D): O POS
Antibody Screen: NEGATIVE

## 2018-12-29 LAB — INFLUENZA PANEL BY PCR (TYPE A & B)
Influenza A By PCR: NEGATIVE
Influenza B By PCR: NEGATIVE

## 2018-12-29 LAB — PHOSPHORUS: PHOSPHORUS: 3.3 mg/dL (ref 2.5–4.6)

## 2018-12-29 LAB — URINALYSIS, MICROSCOPIC (REFLEX)

## 2018-12-29 LAB — BLOOD GAS, VENOUS
ACID-BASE DEFICIT: 12.2 mmol/L — AB (ref 0.0–2.0)
Bicarbonate: 13.6 mmol/L — ABNORMAL LOW (ref 20.0–28.0)
O2 Saturation: 83.9 %
Patient temperature: 98.6
pCO2, Ven: 32 mmHg — ABNORMAL LOW (ref 44.0–60.0)
pH, Ven: 7.251 (ref 7.250–7.430)
pO2, Ven: 57.4 mmHg — ABNORMAL HIGH (ref 32.0–45.0)

## 2018-12-29 LAB — LIPASE, BLOOD: LIPASE: 27 U/L (ref 11–51)

## 2018-12-29 LAB — GLUCOSE, CAPILLARY: GLUCOSE-CAPILLARY: 85 mg/dL (ref 70–99)

## 2018-12-29 LAB — LACTIC ACID, PLASMA
LACTIC ACID, VENOUS: 6.2 mmol/L — AB (ref 0.5–1.9)
Lactic Acid, Venous: 5.5 mmol/L (ref 0.5–1.9)
Lactic Acid, Venous: 8.4 mmol/L (ref 0.5–1.9)

## 2018-12-29 LAB — BRAIN NATRIURETIC PEPTIDE: B Natriuretic Peptide: 969.8 pg/mL — ABNORMAL HIGH (ref 0.0–100.0)

## 2018-12-29 LAB — APTT: aPTT: 44 seconds — ABNORMAL HIGH (ref 24–36)

## 2018-12-29 LAB — PROTIME-INR
INR: 1.98
Prothrombin Time: 22.3 seconds — ABNORMAL HIGH (ref 11.4–15.2)

## 2018-12-29 LAB — MAGNESIUM: Magnesium: 2.3 mg/dL (ref 1.7–2.4)

## 2018-12-29 LAB — CBG MONITORING, ED: Glucose-Capillary: 78 mg/dL (ref 70–99)

## 2018-12-29 MED ORDER — VITAMIN D (ERGOCALCIFEROL) 1.25 MG (50000 UNIT) PO CAPS
50000.00 | ORAL_CAPSULE | ORAL | Status: DC
Start: 2019-01-27 — End: 2018-12-29

## 2018-12-29 MED ORDER — HYDROCORTISONE NA SUCCINATE PF 100 MG IJ SOLR
50.0000 mg | Freq: Four times a day (QID) | INTRAMUSCULAR | Status: DC
  Administered 2018-12-29 – 2018-12-31 (×8): 50 mg via INTRAVENOUS
  Filled 2018-12-29 (×8): qty 2

## 2018-12-29 MED ORDER — METRONIDAZOLE IN NACL 5-0.79 MG/ML-% IV SOLN
500.0000 mg | Freq: Three times a day (TID) | INTRAVENOUS | Status: DC
  Administered 2018-12-29: 500 mg via INTRAVENOUS
  Filled 2018-12-29: qty 100

## 2018-12-29 MED ORDER — LACTATED RINGERS IV BOLUS (SEPSIS): 250.0000 mL | Freq: Once | INTRAVENOUS | Status: DC

## 2018-12-29 MED ORDER — FLUTICASONE FUROATE-VILANTEROL 100-25 MCG/INH IN AEPB
1.00 | INHALATION_SPRAY | RESPIRATORY_TRACT | Status: DC
Start: 2018-12-29 — End: 2018-12-29

## 2018-12-29 MED ORDER — LACTATED RINGERS IV BOLUS (SEPSIS)
1000.0000 mL | Freq: Once | INTRAVENOUS | Status: AC
  Administered 2018-12-29: 1000 mL via INTRAVENOUS

## 2018-12-29 MED ORDER — ALUMINUM-MAGNESIUM-SIMETHICONE 200-200-20 MG/5ML PO SUSP
30.00 | ORAL | Status: DC
Start: ? — End: 2018-12-29

## 2018-12-29 MED ORDER — SODIUM CHLORIDE 0.9 % IV SOLN
1.0000 g | Freq: Two times a day (BID) | INTRAVENOUS | Status: DC
  Administered 2018-12-30: 1 g via INTRAVENOUS
  Filled 2018-12-29 (×2): qty 1

## 2018-12-29 MED ORDER — VANCOMYCIN HCL IN DEXTROSE 1-5 GM/200ML-% IV SOLN
1000.0000 mg | Freq: Once | INTRAVENOUS | Status: AC
  Administered 2018-12-29: 1000 mg via INTRAVENOUS
  Filled 2018-12-29: qty 200

## 2018-12-29 MED ORDER — METRONIDAZOLE IN NACL 5-0.79 MG/ML-% IV SOLN
500.0000 mg | Freq: Three times a day (TID) | INTRAVENOUS | Status: DC
  Administered 2018-12-29 – 2018-12-30 (×2): 500 mg via INTRAVENOUS
  Filled 2018-12-29 (×2): qty 100

## 2018-12-29 MED ORDER — SODIUM CHLORIDE 0.9 % IV SOLN
2.0000 g | Freq: Once | INTRAVENOUS | Status: AC
  Administered 2018-12-29: 2 g via INTRAVENOUS
  Filled 2018-12-29: qty 2

## 2018-12-29 MED ORDER — ACETAMINOPHEN 325 MG PO TABS
650.00 | ORAL_TABLET | ORAL | Status: DC
Start: ? — End: 2018-12-29

## 2018-12-29 MED ORDER — ONDANSETRON HCL 4 MG/2ML IJ SOLN
4.00 | INTRAMUSCULAR | Status: DC
Start: ? — End: 2018-12-29

## 2018-12-29 MED ORDER — ATORVASTATIN CALCIUM 10 MG PO TABS
10.00 | ORAL_TABLET | ORAL | Status: DC
Start: 2018-12-28 — End: 2018-12-29

## 2018-12-29 MED ORDER — OXYCODONE HCL 5 MG PO TABS
10.0000 mg | ORAL_TABLET | Freq: Four times a day (QID) | ORAL | Status: DC | PRN
  Administered 2018-12-29 – 2019-01-01 (×10): 10 mg via ORAL
  Filled 2018-12-29 (×10): qty 2

## 2018-12-29 MED ORDER — LACTATED RINGERS IV BOLUS
250.0000 mL | Freq: Once | INTRAVENOUS | Status: AC
  Administered 2018-12-29: 250 mL via INTRAVENOUS

## 2018-12-29 MED ORDER — PHENYLEPHRINE HCL-NACL 10-0.9 MG/250ML-% IV SOLN: 20.0000 ug/min | INTRAVENOUS | Status: DC

## 2018-12-29 MED ORDER — GUAIFENESIN-DM 100-10 MG/5ML PO SYRP
5.00 | ORAL_SOLUTION | ORAL | Status: DC
Start: ? — End: 2018-12-29

## 2018-12-29 MED ORDER — APIXABAN 2.5 MG PO TABS
2.50 | ORAL_TABLET | ORAL | Status: DC
Start: 2018-12-28 — End: 2018-12-29

## 2018-12-29 MED ORDER — INSULIN ASPART 100 UNIT/ML ~~LOC~~ SOLN
1.0000 [IU] | SUBCUTANEOUS | Status: DC
  Administered 2018-12-30: 1 [IU] via SUBCUTANEOUS
  Administered 2018-12-30: 2 [IU] via SUBCUTANEOUS

## 2018-12-29 MED ORDER — DIVALPROEX SODIUM ER 500 MG PO TB24
500.00 | ORAL_TABLET | ORAL | Status: DC
Start: 2018-12-29 — End: 2018-12-29

## 2018-12-29 MED ORDER — CULTURELLE PO CAPS
1.00 | ORAL_CAPSULE | ORAL | Status: DC
Start: 2018-12-28 — End: 2018-12-29

## 2018-12-29 MED ORDER — LACTATED RINGERS IV SOLN
INTRAVENOUS | Status: DC
  Administered 2018-12-29 – 2018-12-31 (×7): via INTRAVENOUS

## 2018-12-29 MED ORDER — POTASSIUM CHLORIDE 10 MEQ/100ML IV SOLN
10.0000 meq | Freq: Once | INTRAVENOUS | Status: AC
  Administered 2018-12-29: 10 meq via INTRAVENOUS
  Filled 2018-12-29: qty 100

## 2018-12-29 MED ORDER — VANCOMYCIN HCL IN DEXTROSE 750-5 MG/150ML-% IV SOLN: 750.0000 mg | INTRAVENOUS | Status: DC

## 2018-12-29 MED ORDER — ALLOPURINOL 300 MG PO TABS
150.00 | ORAL_TABLET | ORAL | Status: DC
Start: 2018-12-29 — End: 2018-12-29

## 2018-12-29 MED ORDER — PANTOPRAZOLE SODIUM 40 MG PO TBEC
40.00 | DELAYED_RELEASE_TABLET | ORAL | Status: DC
Start: 2018-12-28 — End: 2018-12-29

## 2018-12-29 MED ORDER — LACTATED RINGERS IV BOLUS
500.0000 mL | Freq: Once | INTRAVENOUS | Status: AC
  Administered 2018-12-29: 500 mL via INTRAVENOUS

## 2018-12-29 MED ORDER — NOREPINEPHRINE-SODIUM CHLORIDE 4-0.9 MG/250ML-% IV SOLN
0.0000 ug/min | INTRAVENOUS | Status: DC
  Filled 2018-12-29 (×2): qty 250

## 2018-12-29 MED ORDER — PANCRELIPASE (LIP-PROT-AMYL) 24000-76000 UNITS PO CPEP
80000.00 | ORAL_CAPSULE | ORAL | Status: DC
Start: 2018-12-29 — End: 2018-12-29

## 2018-12-29 MED ORDER — ONDANSETRON 4 MG PO TBDP
4.00 | ORAL_TABLET | ORAL | Status: DC
Start: ? — End: 2018-12-29

## 2018-12-29 MED ORDER — NOREPINEPHRINE BITARTRATE 1 MG/ML IV SOLN
0.0000 ug/min | INTRAVENOUS | Status: DC
  Filled 2018-12-29: qty 4

## 2018-12-29 MED ORDER — SODIUM CHLORIDE 0.9 % IV BOLUS
1000.0000 mL | Freq: Once | INTRAVENOUS | Status: AC
  Administered 2018-12-29: 1000 mL via INTRAVENOUS

## 2018-12-29 MED ORDER — SODIUM CHLORIDE 0.9 % IV SOLN: 2.0000 g | Freq: Once | INTRAVENOUS | Status: DC

## 2018-12-29 MED ORDER — POTASSIUM CHLORIDE CRYS ER 20 MEQ PO TBCR
40.0000 meq | EXTENDED_RELEASE_TABLET | Freq: Once | ORAL | Status: AC
  Administered 2018-12-29: 40 meq via ORAL
  Filled 2018-12-29: qty 2

## 2018-12-29 MED ORDER — METOPROLOL SUCCINATE ER 50 MG PO TB24
50.00 | ORAL_TABLET | ORAL | Status: DC
Start: 2018-12-29 — End: 2018-12-29

## 2018-12-29 MED ORDER — VANCOMYCIN HCL IN DEXTROSE 1-5 GM/200ML-% IV SOLN: 1000.0000 mg | Freq: Once | INTRAVENOUS | Status: DC

## 2018-12-29 NOTE — ED Notes (Signed)
Bed: KD98 Expected date:  Expected time:  Means of arrival:  Comments: HOLD for RES A

## 2018-12-29 NOTE — Progress Notes (Signed)
eLink Physician-Brief Progress Note Patient Name: Amber [name] DOB: 1956/02/19 MRN: [id number]   Date of Service  12/29/2018  HPI/Events of Note  Patient c/o generalized body pain and takes Oxycodone at home.   eICU Interventions  Will order: 1. Oxycodone 10 mg PO Q 6 hours PRN pain.         Anjana Cheek Dennard Nip 12/29/2018, 11:35 PM

## 2018-12-29 NOTE — H&P (Signed)
NAME:  Amber [name], MRN:  [id number], DOB:  1955/12/20, LOS: 0 ADMISSION DATE:  12/29/2018, CONSULTATION DATE:  12/29/2018 REFERRING MD:  EDP - Pfeiffer, CHIEF COMPLAINT:  Septic shock   Brief History   63 year old female with an extensive PMH who was discharged 2/8 from [facility] after being admitted for food poisoning and multiple electrolyte abnormalities.  Patient went home and started feeling unwell.  EMS was called and patient was noted to have a SBP of 60 and patient was given 1200 ml of NS and improved to the 90's.  Patient was also given a duoneb and her breathing was improved.  While in the ED the patient's SBP dropped again to the 80 and PCCM was consulted for admission.  Initial lactic acid was 5.5 and repeat is pending but patient improved with IVF.  Patient is a poor historian so history is attained from records.  History of present illness   63 year old female with an extensive PMH who was discharged 2/8 from [facility] after being admitted for food poisoning and multiple electrolyte abnormalities.  Patient went home and started feeling unwell.  EMS was called and patient was noted to have a SBP of 60 and patient was given 1200 ml of NS and improved to the 90's.  Patient was also given a duoneb and her breathing was improved.  While in the ED the patient's SBP dropped again to the 80 and PCCM was consulted for admission.  Initial lactic acid was 5.5 and repeat is pending but patient improved with IVF.  Patient is a poor historian so history is attained from records.  Past Medical History  CVA DM Pancreatitis  Significant Hospital Events   2/9 admission for hypotension  Consults:  PCCM  Procedures:  N/A  Significant Diagnostic Tests:  CT of the abdomen that I reviewed myself showing no acute findings but multiple chronic changes  Micro Data:  Blood 2/9>>> Urine 2/9>>> Sputum 2/9>>>  Antimicrobials:  Cefepime 2/9>>> Vanc  2/9>>> Flagyl 2/9>>>   Interim history/subjective:  Feels better from a respiratory standpoint  Objective   Blood pressure (!) 83/46, pulse (!) 115, temperature (S) 99 F (37.2 C), temperature source (S) Rectal, resp. rate (!) 24, height 5' (1.524 m), weight 40.5 kg, SpO2 96 %.        Intake/Output Summary (Last 24 hours) at 12/29/2018 1615 Last data filed at 12/29/2018 1551 Gross per 24 hour  Intake 550 ml  Output -  Net 550 ml   Filed Weights   12/29/18 1229  Weight: 40.5 kg    Examination: General: Chronically ill appearing female, NAD HENT: [place]/AT, PERRL, EOM-I and MMM Lungs: Bibasilar crackles Cardiovascular: RRR, Nl S1/S2 and -M/R/G Abdomen: Soft, diffusely tender, ND and +BS Extremities: -edema and -tenderness Neuro: Lethargic but awake and interactive, moving all ext to command Skin: intact  Resolved Hospital Problem list   N/A  Assessment & Plan:  63 year old female with extensive PMH who presents to PCCM with septic shock.  Discussed with PCCM-NP.    Septic shock:  - IVF resuscitation  - Peripheral neo if needed  - Will accept a SBP of 80-90  - Treat infection  Abdominal infection:  - Pan culture  - PCT  - Cefepime/vanc/flagyl  Chronic pancreatitis:  - NPO for now  - May need involvement of GI in AM  DM:  - ISS  - CBGs  Lactic acidosis: due to metformin use  -  Hold metformin  - Lactic acid level in AM  - Maintain BP  Chronic steroid use that the patient stopped abruptly due to abdominal pain  - Stress dose steroids  - Check cortisol level  PCCM will admit to the ICU  Labs   CBC: Recent Labs  Lab 12/29/18 1223  WBC 22.4*  NEUTROABS 15.0*  HGB 7.2*  HCT 22.6*  MCV 83.1  PLT PLATELET CLUMPS NOTED ON SMEAR, UNABLE TO ESTIMATE    Basic Metabolic Panel: Recent Labs  Lab 12/29/18 1223  NA 140  K 2.7*  CL 119*  CO2 15*  GLUCOSE 66*  BUN 11  CREATININE 1.39*  CALCIUM 7.5*  MG 2.3  PHOS 3.3   GFR: Estimated Creatinine  Clearance: 27.5 mL/min (A) (by C-G formula based on SCr of 1.39 mg/dL (H)). Recent Labs  Lab 12/29/18 1223  WBC 22.4*  LATICACIDVEN 5.5*    Liver Function Tests: Recent Labs  Lab 12/29/18 1223  AST 66*  ALT 24  ALKPHOS 212*  BILITOT 1.9*  PROT 4.5*  ALBUMIN 1.7*   Recent Labs  Lab 12/29/18 1223  LIPASE 27   No results for input(s): AMMONIA in the last 168 hours.  ABG    Component Value Date/Time   PHART 7.290 (L) 09/24/2018 1959   PCO2ART 42.0 09/24/2018 1959   PO2ART 87.0 09/24/2018 1959   HCO3 13.6 (L) 12/29/2018 1308   TCO2 23 10/10/2018 1413   ACIDBASEDEF 12.2 (H) 12/29/2018 1308   O2SAT 83.9 12/29/2018 1308     Coagulation Profile: Recent Labs  Lab 12/29/18 1223  INR 1.98    Cardiac Enzymes: No results for input(s): CKTOTAL, CKMB, CKMBINDEX, TROPONINI in the last 168 hours.  HbA1C: Hgb A1c MFr Bld  Date/Time Value Ref Range Status  05/08/2018 11:14 AM 5.7 (H) 4.8 - 5.6 % Final    Comment:    (NOTE) Pre diabetes:          5.7%-6.4% Diabetes:              >6.4% Glycemic control for   <7.0% adults with diabetes   10/27/2016 05:32 AM 4.7 (L) 4.8 - 5.6 % Final    Comment:    (NOTE)         Pre-diabetes: 5.7 - 6.4         Diabetes: >6.4         Glycemic control for adults with diabetes: <7.0     CBG: No results for input(s): GLUCAP in the last 168 hours.  Review of Systems:   Negative other than above  Past Medical History  She,  has a past medical history of ACS (acute coronary syndrome) (HCC) (06/12/2015), Acute on chronic blood loss anemia (11/25/2017), Arthritis, Asthma, Cerebrovascular accident (CVA) due to embolism (HCC) (10/26/2016), Chronic pain, Constipation, Diabetes mellitus without complication (HCC), Duodenitis, Embolic stroke (HCC), Falls frequently, Focal seizure (HCC), GERD (gastroesophageal reflux disease), Hypertension, Impacted fracture (02/15/2017), Pneumonia (10/2015), Seizures (HCC), and Small bowel obstruction (HCC)  (11/25/2017).   Surgical History    Past Surgical History:  Procedure Laterality Date  . APPENDECTOMY    . BACK SURGERY    . CHOLECYSTECTOMY    . TEE WITHOUT CARDIOVERSION N/A 10/30/2016   Procedure: TRANSESOPHAGEAL ECHOCARDIOGRAM (TEE);  Surgeon: Lars Masson, MD;  Location: [facility] ENDOSCOPY;  Service: Cardiovascular;  Laterality: N/A;     Social History   reports that she quit smoking about 10 years ago. She has never used smokeless tobacco. She reports that she does  not drink alcohol or use drugs.   Family History   Her family history includes Hypertension in her brother and sister; Kidney failure in her mother.   Allergies Allergies  Allergen Reactions  . Ivp Dye [Iodinated Diagnostic Agents] Itching  . Metrizamide Itching     Home Medications  Prior to Admission medications   Medication Sig Start Date End Date Taking? Authorizing Provider  allopurinol (ZYLOPRIM) 300 MG tablet Take 300 mg by mouth daily. 11/11/16  Yes [provider]  apixaban (ELIQUIS) 2.5 MG TABS tablet Take 2.5 mg by mouth 2 (two) times daily.   Yes [provider]  atorvastatin (LIPITOR) 10 MG tablet Take 10 mg by mouth daily at 6 PM.  08/13/15  Yes [provider]  colestipol (COLESTID) 1 g tablet Take 1 g by mouth 2 (two) times daily.   Yes [provider]  esomeprazole (NEXIUM) 40 MG capsule Take 40 mg by mouth daily. 12/17/18  Yes [provider]  feeding supplement, ENSURE ENLIVE, (ENSURE ENLIVE) LIQD Take 237 mLs by mouth 2 (two) times daily between meals. 11/10/15  Yes Tat, Onalee Hua, MD  furosemide (LASIX) 20 MG tablet Take 1 tablet (20 mg total) by mouth daily. 10/15/18  Yes Mikhail, Maryann, DO  metoprolol succinate (TOPROL-XL) 25 MG 24 hr tablet Take 1 tablet (25 mg total) by mouth daily. 10/15/18  Yes Mikhail, [place], DO  Multiple Vitamin (MULTIVITAMIN WITH MINERALS) TABS tablet Take 1 tablet by mouth daily.   Yes [provider]  Oxycodone HCl  20 MG TABS Take 20 mg by mouth 4 (four) times daily. 12/07/18  Yes [provider]  Pancrelipase, Lip-Prot-Amyl, 24000-76000 units CPEP Take 3 capsules by mouth 3 (three) times daily. 11/06/18 02/04/19 Yes [provider]  pantoprazole (PROTONIX) 40 MG tablet Take 40 mg by mouth 2 (two) times daily. 08/21/18  Yes [provider]  predniSONE (DELTASONE) 5 MG tablet Take 1 tablet (5 mg total) by mouth 3 (three) times daily. Restart after prednisone taper. 10/14/18  Yes Mikhail, [place], DO  valproic acid (DEPAKENE) 250 MG capsule Take 500 mg by mouth 3 (three) times daily.   Yes [provider]  fluconazole (DIFLUCAN) 100 MG tablet Take 1 tablet (100 mg total) by mouth daily. Patient not taking: Reported on 12/29/2018 10/15/18   Edsel Petrin, DO  predniSONE (DELTASONE) 20 MG tablet Take in the morning. Take 2 tabs x 1 days, then 1 tabs x 2 days. On 11/29, resume home dose. Patient not taking: Reported on 12/29/2018 10/14/18   Edsel Petrin, DO    The patient is critically ill with multiple organ systems failure and requires high complexity decision making for assessment and support, frequent evaluation and titration of therapies, application of advanced monitoring technologies and extensive interpretation of multiple databases.   Critical Care Time devoted to patient care services described in this note is  45  Minutes. This time reflects time of care of this signee Dr Koren Bound. This critical care time does not reflect procedure time, or teaching time or supervisory time of PA/NP/Med student/Med Resident etc but could involve care discussion time.  Alyson Reedy, M.D. [facility] Pulmonary/Critical Care Medicine. Pager: [phone]. After hours pager: [phone].

## 2018-12-29 NOTE — ED Notes (Signed)
[facility] here for transport to [place].  

## 2018-12-29 NOTE — Progress Notes (Signed)
Pharmacy Antibiotic Note  Amber [name] is a 63 y.o. female admitted on 12/29/2018 with sepsis.  Pharmacy has been consulted for vancomycin and cefepime dosing. Flagyl is also ordered per MD.   Plan: Cefepime 2 g iv once given in ED. Continue with cefepime 1 g iv q12 h.   Vancomycin 1000 mg iv loading dose given in ED then 750 mg iv q 48h. AUC 472 TBW/TBW.  Suspected source intra-abdominal per CCM. Will likely not need empiric vancomycin. Will f/u plans for antibiotics 2/10.   Height: 5' (152.4 cm) Weight: 89 lb 4.6 oz (40.5 kg) IBW/kg (Calculated) : 45.5  Temp (24hrs), Avg:99 F (37.2 C), Min:99 F (37.2 C), Max:99 F (37.2 C)  Recent Labs  Lab 12/29/18 1223 12/29/18 1423  WBC 22.4*  --   CREATININE 1.39*  --   LATICACIDVEN 5.5* 8.4*    Estimated Creatinine Clearance: 27.5 mL/min (A) (by C-G formula based on SCr of 1.39 mg/dL (H)).    Allergies  Allergen Reactions  . Ivp Dye [Iodinated Diagnostic Agents] Itching  . Metrizamide Itching    Antimicrobials this admission: 2/9 cefepime >>  2/9 vancomycin >>  2/9 metronidazole >>  Dose adjustments this admission:  Microbiology results: 2/9 BCx:  2/9 UCx:    Thank you for allowing pharmacy to be a part of this patient's care.  Luisa Hart D 12/29/2018 5:40 PM

## 2018-12-29 NOTE — Progress Notes (Signed)
A consult was received from an ED physician for vancomycin and cefepime per pharmacy dosing.  The patient's profile has been reviewed for ht/wt/allergies/indication/available labs.   A one time order has been placed for vancomycin 1000 mg and cefepime 2 g iv once per MD.  Further antibiotics/pharmacy consults should be ordered by admitting physician if indicated.                       Thank you, Valentina Gu 12/29/2018  12:31 PM

## 2018-12-29 NOTE — ED Notes (Signed)
Patient transported to CT 

## 2018-12-29 NOTE — ED Provider Notes (Signed)
[facility]-EMERGENCY DEPT Provider Note   CSN: [id number] Arrival date & time: 12/29/18  1207     History   Chief Complaint No chief complaint on file.   HPI Amber [name] is a 63 y.o. female.  HPI Patient was discharged yesterday from [facility] hospital.  Discharge diagnosis was hypomagnesemia and food poisoning.  Reportedly, patient had an overnight stay and was subsequently discharged.  EMS reports on arrival patient was confused and lethargic.  Initial systolic blood pressure was in the 60s.  Patient was hypoxic.  CBG was in the 70s.  Patient was febrile to 103.  They initiated fluid resuscitation with 1200 cc normal saline.  Patient's blood pressure improved to the 90s systolic.  They administered DuoNeb and oxygen.  Patient's breathing improved significantly.  Prior to arrival, patient has become alert and interactive.  Patient reports she has generalized pain aching in quality.  She reports she was having a lot of diarrhea before she went to [facility].  She denies she was having vomiting.  She reports she was having some generalized abdominal discomfort but is not endorsing active or localized abdominal pain.  She denies chest pain.  She reports she has felt short of breath.  She reports she was febrile before going to the hospital. Past Medical History:  Diagnosis Date  . ACS (acute coronary syndrome) (HCC) 06/12/2015  . Acute on chronic blood loss anemia 11/25/2017  . Arthritis   . Asthma   . 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 Chronic pain   . Constipation   . Diabetes mellitus without complication (HCC)   . Duodenitis   . Embolic stroke (HCC)   . Falls frequently   . Focal seizure (HCC)   . GERD (gastroesophageal reflux disease)   . Hypertension   . Impacted fracture 02/15/2017   right glenoid  . Pneumonia 10/2015  . Seizures (HCC)    last seizure March 2015  . Small bowel obstruction (HCC) 11/25/2017      Patient Active Problem List   Diagnosis Date Noted  . Pressure injury of skin 10/11/2018  . Sepsis due to pneumonia (HCC) 10/10/2018  . Failure to thrive in adult 10/10/2018  . 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 Anasarca 10/10/2018  . HCAP (healthcare-associated pneumonia) 09/24/2018  . Acute metabolic encephalopathy 05/09/2018  . Metabolic acidosis 05/09/2018  . Mild persistent asthma 05/09/2018  . 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 Moderate malnutrition (HCC) 05/07/2018  . Paroxysmal atrial fibrillation with RVR (HCC) 05/07/2018  . Nausea & vomiting 05/06/2018  . Chronic anemia 05/06/2018  . PFO (patent foramen ovale) 02/15/2017  . History of CVA with residual deficit 02/15/2017  . Drug-induced hyperglycemia 02/15/2017  . 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 Sepsis (HCC) 02/29/2016  . Abdominal pain 11/07/2015  . Constipation 11/07/2015  . Hypomagnesemia 08/25/2015  . Diabetes mellitus type 2, controlled (HCC) 07/27/2015  . Chronic back pain   . Frequent falls   . Essential hypertension   . Pulmonary hypertension (HCC)   . Gout   . Microcytic anemia 01/26/2015  . History of seizure 01/26/2015    Past Surgical History:  Procedure Laterality Date  . APPENDECTOMY    . BACK SURGERY    . CHOLECYSTECTOMY    . TEE WITHOUT CARDIOVERSION N/A 10/30/2016   Procedure: TRANSESOPHAGEAL ECHOCARDIOGRAM (TEE);  Surgeon: Lars Masson, MD;  Location: [facility] ENDOSCOPY;  Service: Cardiovascular;  Laterality: N/A;  OB History   No obstetric history on file.      Home Medications    Prior to Admission medications   Medication Sig Start Date End Date Taking? Authorizing Provider  allopurinol (ZYLOPRIM) 300 MG tablet Take 300 mg by mouth daily. 11/11/16   [provider]  atorvastatin (LIPITOR) 10 MG tablet Take 10 mg by mouth daily at 6 PM.  08/13/15   [provider]  colestipol (COLESTID) 1 g tablet Take 1 g by mouth 2 (two)  times daily.    [provider]  feeding supplement, ENSURE ENLIVE, (ENSURE ENLIVE) LIQD Take 237 mLs by mouth 2 (two) times daily between meals. Patient not taking: Reported on 09/24/2018 11/10/15   Catarina Hartshornat, David, MD  fluconazole (DIFLUCAN) 100 MG tablet Take 1 tablet (100 mg total) by mouth daily. 10/15/18   Mikhail, Nita SellsMaryann, DO  furosemide (LASIX) 20 MG tablet Take 1 tablet (20 mg total) by mouth daily. 10/15/18   Mikhail, Nita SellsMaryann, DO  METFORMIN HCL PO Take 1 tablet by mouth as needed (BS).    [provider]  metoprolol succinate (TOPROL-XL) 25 MG 24 hr tablet Take 1 tablet (25 mg total) by mouth daily. 10/15/18   Edsel PetrinMikhail, Maryann, DO  Multiple Vitamin (MULTIVITAMIN WITH MINERALS) TABS tablet Take 1 tablet by mouth daily.    [provider]  pantoprazole (PROTONIX) 40 MG tablet Take 40 mg by mouth 2 (two) times daily. 08/21/18   [provider]  predniSONE (DELTASONE) 20 MG tablet Take in the morning. Take 2 tabs x 1 days, then 1 tabs x 2 days. On 11/29, resume home dose. 10/14/18   Mikhail, Nita SellsMaryann, DO  predniSONE (DELTASONE) 5 MG tablet Take 1 tablet (5 mg total) by mouth 3 (three) times daily. Restart after prednisone taper. 10/14/18   Edsel PetrinMikhail, Maryann, DO  valproic acid (DEPAKENE) 250 MG capsule Take 500 mg by mouth 3 (three) times daily.    [provider]    Family History Family History  Problem Relation Age of Onset  . Kidney failure Mother   . Hypertension Sister   . Hypertension Brother     Social History Social History   Tobacco Use  . Smoking status: Former Smoker    Last attempt to quit: 2010    Years since quitting: 10.1  . Smokeless tobacco: Never Used  Substance Use Topics  . Alcohol use: No    Comment: admits to 2 drinks/week  . Drug use: No     Allergies   Ivp dye [iodinated diagnostic agents] and Metrizamide   Review of Systems Review of Systems 10 Systems reviewed and are negative for acute change except as  noted in the HPI.   Physical Exam Updated Vital Signs There were no vitals taken for this visit.  Physical Exam Constitutional:      Comments: Patient is alert on arrival.  She has mild to moderate increased work of breathing.  She is getting a nebulizer on nonrebreather mask.  Patient is conversant and interactive.  She does appear deconditioned.  HENT:     Head: Normocephalic and atraumatic.     Mouth/Throat:     Mouth: Mucous membranes are moist.  Eyes:     Extraocular Movements: Extraocular movements intact.  Cardiovascular:     Comments: Borderline tachycardia.  No gross rub murmur gallop. Pulmonary:     Comments: Mild to moderate increased work of breathing.  Crackles in the lower lung fields.  Occasional expiratory wheeze. Abdominal:  Comments: Patient's abdomen feels slightly firm and distended.  She denies that it is painful to palpation.  Musculoskeletal:     Comments: Trace edema at the ankles and lower legs.  Feet and lower legs are in good condition with no active cellulitis or large edema.  Skin:    General: Skin is warm and dry.  Neurological:     Comments: Patient is awake and alert.  She is answering questions appropriately.  Nonfocal neurologic exam.  Psychiatric:        Mood and Affect: Mood normal.      ED Treatments / Results  Labs (all labs ordered are listed, but only abnormal results are displayed) Labs Reviewed - No data to display  EKG EKG Interpretation  Date/Time:  Sunday December 29 2018 12:27:59 EST Ventricular Rate:  124 PR Interval:    QRS Duration: 68 QT Interval:  349 QTC Calculation: 502 R Axis:   83 Text Interpretation:  Sinus tachycardia Low voltage, extremity leads Anteroseptal infarct, old Nonspecific T abnormalities, lateral leads wandering baseline, likely ST depression inferior new compared to previous Confirmed by Arby Barrette [phone]) on 12/29/2018 12:54:39 PM   Radiology Ct Abdomen Pelvis Wo Contrast  Result Date:  12/29/2018 CLINICAL DATA:  Abdomen distension two days ago here for food poisoning Allergic to iv contrast no oral contrast per MD EXAM: CT ABDOMEN AND PELVIS WITHOUT CONTRAST TECHNIQUE: Multidetector CT imaging of the abdomen and pelvis was performed following the standard protocol without IV contrast. COMPARISON:  12/27/2018 FINDINGS: Lower chest: There is bibasilar atelectasis. Hepatobiliary: The liver is homogeneous. Status post cholecystectomy. Pancreas: Pancreas is atrophic. Coarse calcifications throughout the pancreatic body and tail suggests history of chronic pancreatitis. Spleen: Normal in size without focal abnormality. Adrenals/Urinary Tract: Adrenal glands are unremarkable. Kidneys are normal, without renal calculi, focal lesion, or hydronephrosis. Bladder is unremarkable. Stomach/Bowel: Stomach is unremarkable. Small bowel loops are normal in appearance. There are numerous colonic diverticula. No acute diverticulitis. Significant stool within the rectosigmoid colon. There has been some improvement in the appearance of thickening of the rectosigmoid colon. No perirectal abscess. Vascular/Lymphatic: There is dense atherosclerotic calcification of the abdominal aorta not associated with aneurysm. No retroperitoneal or mesenteric adenopathy. Reproductive: The uterus is present. Multiple mural calcifications are present. No adnexal mass. Other: No ascites. No abdominal wall hernia. Note is made of fat within the LEFT inguinal ring, likely a lipoma. Diffuse body wall edema. Musculoskeletal: Remote wedge compression fractures of T12, L1, and L3. There is 2 millimeters anterolisthesis of L4 on L5. Degenerative changes are identified at L4-5 and L5-S1. No suspicious lytic or blastic lesions are identified. IMPRESSION: 1. Significant stool within the rectosigmoid colon. There has been some improvement in the appearance of thickening of the rectosigmoid colon. 2. Diverticulosis without acute diverticulitis. 3.  Status post cholecystectomy. 4. Changes of chronic pancreatitis. 5. Remote wedge compression fractures of T12, L1, and L3. Aortic Atherosclerosis (ICD10-I70.0). Electronically Signed   By: Norva Pavlov M.D.   On: 12/29/2018 13:12   Dg Chest Port 1 View  Result Date: 12/29/2018 CLINICAL DATA:  63 year old female with altered mental status, fever, decreased p.o. intake. EXAM: PORTABLE CHEST 1 VIEW COMPARISON:  Portable chest 11/13/2018 and earlier. FINDINGS: Portable AP semi upright view at 1320 hours. Sequelae of previous thoracic spine corpectomy and posterior fusion. Hardware appears stable. Stable visualized osseous structures. Stable lung volumes and mediastinal contours. Visualized tracheal air column is within normal limits. Chronic increased pulmonary interstitial markings appear stable since 2019. No pneumothorax,  pleural effusion or acute pulmonary opacity. Mildly increased bowel gas in the abdomen. Stable cholecystectomy clips. IMPRESSION: Stable.  No acute cardiopulmonary abnormality. Electronically Signed   By: Odessa Fleming M.D.   On: 12/29/2018 14:03    Procedures Procedures (including critical care time) CRITICAL CARE Performed by: Arby Barrette   Total critical care time: 60 minutes  Critical care time was exclusive of separately billable procedures and treating other patients.  Critical care was necessary to treat or prevent imminent or life-threatening deterioration.  Critical care was time spent personally by me on the following activities: development of treatment plan with patient and/or surrogate as well as nursing, discussions with consultants, evaluation of patient's response to treatment, examination of patient, obtaining history from patient or surrogate, ordering and performing treatments and interventions, ordering and review of laboratory studies, ordering and review of radiographic studies, pulse oximetry and re-evaluation of patient's condition. Medications Ordered in  ED Medications  lactated ringers infusion ( Intravenous Rate/Dose Verify 12/30/18 0600)  metroNIDAZOLE (FLAGYL) IVPB 500 mg ( Intravenous Rate/Dose Verify 12/30/18 0600)  phenylephrine (NEOSYNEPHRINE) 10-0.9 MG/250ML-% infusion (0 mcg/min Intravenous Hold 12/29/18 1712)  hydrocortisone sodium succinate (SOLU-CORTEF) 100 MG injection 50 mg (50 mg Intravenous Given 12/30/18 0538)  insulin aspart (novoLOG) injection 1-3 Units (1 Units Subcutaneous Not Given 12/30/18 0856)  lactated ringers bolus 1,000 mL (1,000 mLs Intravenous Transfusing/Transfer 12/29/18 1947)    And  lactated ringers bolus 250 mL (has no administration in time range)  vancomycin (VANCOCIN) IVPB 750 mg/150 ml premix (has no administration in time range)  ceFEPIme (MAXIPIME) 1 g in sodium chloride 0.9 % 100 mL IVPB ( Intravenous Stopped 12/30/18 0129)  oxyCODONE (Oxy IR/ROXICODONE) immediate release tablet 10 mg (10 mg Oral Given 12/30/18 0556)  0.9 %  sodium chloride infusion ( Intravenous Restarted 12/30/18 0536)  ceFEPIme (MAXIPIME) 2 g in sodium chloride 0.9 % 100 mL IVPB (0 g Intravenous Stopped 12/29/18 1424)  vancomycin (VANCOCIN) IVPB 1000 mg/200 mL premix (0 mg Intravenous Stopped 12/29/18 1551)  lactated ringers bolus 250 mL (0 mLs Intravenous Stopped 12/29/18 1551)  potassium chloride 10 mEq in 100 mL IVPB (0 mEq Intravenous Stopped 12/29/18 1658)  potassium chloride SA (K-DUR,KLOR-CON) CR tablet 40 mEq (40 mEq Oral Given 12/29/18 1552)  lactated ringers bolus 500 mL (0 mLs Intravenous Stopped 12/29/18 1705)  sodium chloride 0.9 % bolus 1,000 mL ( Intravenous Rate/Dose Verify 12/29/18 2248)     Initial Impression / Assessment and Plan / ED Course  I have reviewed the triage vital signs and the nursing notes.  Pertinent labs & imaging results that were available during my care of the patient were reviewed by me and considered in my medical decision making (see chart for details).  Clinical Course as of Dec 31 1031  Wynelle Link Dec 29, 2018    1410 Consult:Rakesh intensivist will see in the ED   [MP]  1450 Consult: Reviewed with Dr. Molli Knock.  At this time, he advised to hold Levophed.  Based on clinical description does not think patient needs that started at this time.  Continue with fluid resuscitation and a repeat lactic acid.  If repeat lactic is elevating or not improving, reconsult for ICU admission otherwise recommends hospitalist admission.   [MP]    Clinical Course User Index [MP] Arby Barrette, MD   Patient presents with signs of sepsis.  She had recent discharge from hospital.  Patient reports she was having large volumes of diarrhea.  No other immediate source evident.  CT  does not show acute surgical findings.  Patient started on broad-spectrum antibiotic coverage.  Fluid resuscitation initiated.  Patient had had 30 cc/kg volume per EMS and responded to blood pressures in the 90s systolic.  Pressures continue to drift down to the 80s periodically.  Reviewed this with Dr. Molli KnockYacoub.  Felt that as long as clinically in terms of mentation and respiratory status patient was remaining stable hold Levophed until we assess lactic and at that time determine if ICU admission indicated.  Patient complained of foot pain.  Pain was present with just light touch over both lower extremities suggestive of neuropathic pain.  Patient has intact dorsalis pedis pulses bilaterally.  No significant edema.  No skin wounds.  No findings suggestive of cellulitis or vascular occlusion.  Patient admitted to ICU intensivist service.  Mental status and respiratory status remained stable upon rechecks prior to admission consult by Dr. Molli KnockYacoub.  Final Clinical Impressions(s) / ED Diagnoses   Final diagnoses:  Sepsis, due to unspecified organism, unspecified whether acute organ dysfunction present [facility](HCC)  Severe comorbid illness    ED Discharge Orders    None       Arby BarrettePfeiffer, Zain Lankford, MD 12/30/18 1037

## 2018-12-29 NOTE — ED Notes (Signed)
Date and time results received: 12/29/18 1653 (use smartphrase ".now" to insert current time)  Test: lactic acid Critical Value: 8.4  Name of Provider Notified: Dr Donnald Garre  Orders Received? Or Actions Taken?: Actions Taken: reported lactic acid of 8.4 to Dr. Donnald Garre.

## 2018-12-29 NOTE — ED Notes (Signed)
Bed: RESA Expected date:  Expected time:  Means of arrival:  Comments: Sepsis

## 2018-12-29 NOTE — ED Notes (Signed)
Critical Care MD at bedside with patient. 

## 2018-12-29 NOTE — ED Notes (Signed)
Patient stated been having diarrhea for past 4 days. PureWick placed once patient got cleaned up

## 2018-12-29 NOTE — Progress Notes (Signed)
eLink Physician-Brief Progress Note Patient Name: Amber [name] DOB: November 16, 1956 MRN: [id number]   Date of Service  12/29/2018  HPI/Events of Note  Hypotension - BP = 82/62 with MAP = 69 and 2. Request to recheck K+ post replacement.  Last LVEF = 65-70%.  eICU Interventions  Will order: 1. BMP STAT. 2. Bolus with 0.9 NaCl 1 liter IV over 1 hour now.      Intervention Category Major Interventions: Hypotension - evaluation and management  Alger Kerstein Dennard Nip 12/29/2018, 9:38 PM

## 2018-12-29 NOTE — ED Notes (Signed)
Patient has been cleaned and changed.

## 2018-12-29 NOTE — ED Notes (Signed)
ED TO INPATIENT HANDOFF REPORT  Name/Age/Gender Amber [name] 63 y.o. female  Code Status Code Status History    Date Active Date Inactive Code Status Order ID Comments User Context   10/10/2018 2312 10/14/2018 2100 Full Code [id number]  Briscoe Deutscherpyd, Timothy S, MD Inpatient   09/24/2018 2318 10/01/2018 1714 Full Code [id number]  Kathlene Coteesai, Rahul P, PA-C ED   05/06/2018 2245 05/14/2018 0044 Full Code [id number]  Eduard ClosKakrakandy, Arshad N, MD ED   11/19/2017 0000 11/26/2017 1751 Full Code [id number]  Reymundo PollGuilloud, Carolyn, MD ED   10/20/2017 2034 10/23/2017 2154 Full Code [id number]  Camelia PhenesHoffman, Jessica Ratliff, DO Inpatient   11/23/2016 1653 11/27/2016 1916 Full Code [id number]  Richarda OverlieAbrol, Nayana, MD Inpatient   10/26/2016 0551 10/31/2016 2150 Full Code [id number]  Alberteen Samanford, Christopher P, MD ED   05/25/2016 0714 05/26/2016 1530 Full Code [id number]  Eduard ClosKakrakandy, Arshad N, MD Inpatient   02/29/2016 0232 03/01/2016 1738 Full Code [id number]  Clydie BraunSmith, Rondell A, MD Inpatient   12/24/2015 0400 12/25/2015 2111 Full Code [id number]  Lorretta HarpNiu, Xilin, MD ED   11/07/2015 0050 11/10/2015 1927 Full Code [id number]  Ron ParkerJenkins, Harvette C, MD Inpatient   08/24/2015 0550 08/25/2015 2114 Full Code [id number]  Hillary BowGardner, Jared M, DO ED   07/27/2015 0044 08/02/2015 1719 Full Code [id number]  Eduard ClosKakrakandy, Arshad N, MD Inpatient   06/12/2015 0426 06/14/2015 1603 Full Code [id number]  Eduard ClosKakrakandy, Arshad N, MD Inpatient   01/26/2015 0338 01/27/2015 1427 Full Code [id number]  Eduard ClosKakrakandy, Arshad N, MD Inpatient      Home/SNF/Other Home  Chief Complaint Sepsis  Level of Care/Admitting Diagnosis ED Disposition    ED Disposition Condition Comment   Admit  [facility] [[id number]]  Level of Care: ICU [6]  Diagnosis: Septic shock [facility](HCC) [[id number]]) [[id number]]  Admitting Physician: Alyson ReedyYACOUB, WESAM G [phone][[id number]]  Attending Physician: Alyson ReedyYACOUB, WESAM G [phone][[id number]]  Estimated length of stay: 3 - 4 days  Certification:: I certify this patient will need inpatient services for at least 2  midnights  PT Class (Do Not Modify): Inpatient [101]  PT Acc Code (Do Not Modify): Private [1]       Medical History Past Medical History:  Diagnosis Date  . ACS (acute coronary syndrome) (HCC) 06/12/2015  . Acute on chronic blood loss anemia 11/25/2017  . Arthritis   . Asthma   . 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 Chronic pain   . Constipation   . Diabetes mellitus without complication (HCC)   . Duodenitis   . Embolic stroke (HCC)   . Falls frequently   . Focal seizure (HCC)   . GERD (gastroesophageal reflux disease)   . Hypertension   . Impacted fracture 02/15/2017   right glenoid  . Pneumonia 10/2015  . Seizures (HCC)    last seizure March 2015  . Small bowel obstruction (HCC) 11/25/2017    Allergies Allergies  Allergen Reactions  . Ivp Dye [Iodinated Diagnostic Agents] Itching  . Metrizamide Itching    IV Location/Drains/Wounds Patient Lines/Drains/Airways Status   Active Line/Drains/Airways    Name:   Placement date:   Placement time:   Site:   Days:   Peripheral IV 12/29/18 Right Hand   12/29/18    1221    Hand   less than 1   Peripheral IV 12/29/18 Right;Upper Arm   12/29/18    1302    Arm   less than 1   Peripheral IV 12/29/18 Right Forearm   12/29/18    1320  Forearm   less than 1   Pressure Injury 10/10/18 Stage II -  Partial thickness loss of dermis presenting as a shallow open ulcer with a red, pink wound bed without slough.   10/10/18    2230     80   Pressure Injury 10/10/18 Stage II -  Partial thickness loss of dermis presenting as a shallow open ulcer with a red, pink wound bed without slough.   10/10/18    2230     80   Pressure Injury 10/10/18 Stage II -  Partial thickness loss of dermis presenting as a shallow open ulcer with a red, pink wound bed without slough.   10/10/18    2230     80   Pressure Injury 10/10/18 Stage II -  Partial thickness loss of dermis presenting as a shallow open ulcer with a red, pink wound bed  without slough.   10/10/18    2230     80   Pressure Injury 10/10/18 Stage II -  Partial thickness loss of dermis presenting as a shallow open ulcer with a red, pink wound bed without slough.   10/10/18    2230     80   Wound / Incision (Open or Dehisced) 10/10/18 Other (Comment) Sacrum Medial Moisture Associated Wound   10/10/18    2230    Sacrum   80   Wound / Incision (Open or Dehisced) 10/10/18 Other (Comment) Groin Right Moisture Associated Wound   10/10/18    2230    Groin   80   Wound / Incision (Open or Dehisced) 10/10/18 Other (Comment) Groin Left Moisture Associated Wound   10/10/18    2230    Groin   80   Wound / Incision (Open or Dehisced) 10/10/18 Other (Comment) Buttocks Left Moisture Associated Wound   10/10/18    2230    Buttocks   80   Wound / Incision (Open or Dehisced) 10/10/18 Other (Comment) Buttocks Right Moisture Associated Wound   10/10/18    2230    Buttocks   80          Labs/Imaging Results for orders placed or performed during the hospital encounter of 12/29/18 (from the past 48 hour(s))  Lactic acid, plasma     Status: Abnormal   Collection Time: 12/29/18 12:23 PM  Result Value Ref Range   Lactic Acid, Venous 5.5 (HH) 0.5 - 1.9 mmol/L    Comment: CRITICAL RESULT CALLED TO, READ BACK BY AND VERIFIED WITH: BARHAM,R RN [phone] [id number] COVINGTON,N Performed at [facility], [address] [place], [place] [address]   Comprehensive metabolic panel     Status: Abnormal   Collection Time: 12/29/18 12:23 PM  Result Value Ref Range   Sodium 140 135 - 145 mmol/L   Potassium 2.7 (LL) 3.5 - 5.1 mmol/L    Comment: CRITICAL RESULT CALLED TO, READ BACK BY AND VERIFIED WITH: BRAHAM,R RN [phone] [id number] COVINGTON,N    Chloride 119 (H) 98 - 111 mmol/L   CO2 15 (L) 22 - 32 mmol/L   Glucose, Bld 66 (L) 70 - 99 mg/dL   BUN 11 6 - 20 mg/dL   Creatinine, Ser 0.25 (H) 0.44 - 1.00 mg/dL   Calcium 7.5 (L) 8.9 - 10.3 mg/dL   Total Protein 4.5 (L) 6.5 - 8.1 g/dL    Albumin 1.7 (L) 3.5 - 5.0 g/dL   AST 66 (H) 15 - 41 U/L   ALT 24 0 - 44  U/L   Alkaline Phosphatase 212 (H) 38 - 126 U/L   Total Bilirubin 1.9 (H) 0.3 - 1.2 mg/dL   GFR calc non Af Amer 41 (L) >60 mL/min   GFR calc Af Amer 48 (L) >60 mL/min   Anion gap 6 5 - 15    Comment: Performed at [facility], [address], [place], [place] [address]  CBC WITH DIFFERENTIAL     Status: Abnormal   Collection Time: 12/29/18 12:23 PM  Result Value Ref Range   WBC 22.4 (H) 4.0 - 10.5 K/uL   RBC 2.72 (L) 3.87 - 5.11 MIL/uL   Hemoglobin 7.2 (L) 12.0 - 15.0 g/dL   HCT 60.4 (L) 54.0 - 98.1 %   MCV 83.1 80.0 - 100.0 fL   MCH 26.5 26.0 - 34.0 pg   MCHC 31.9 30.0 - 36.0 g/dL   RDW 19.1 (H) 47.8 - 29.5 %   Platelets PLATELET CLUMPS NOTED ON SMEAR, UNABLE TO ESTIMATE 150 - 400 K/uL   nRBC 0.0 0.0 - 0.2 %   Neutrophils Relative % 67 %   Neutro Abs 15.0 (H) 1.7 - 7.7 K/uL   Lymphocytes Relative 17 %   Lymphs Abs 3.8 0.7 - 4.0 K/uL   Monocytes Relative 5 %   Monocytes Absolute 1.0 0.1 - 1.0 K/uL   Eosinophils Relative 2 %   Eosinophils Absolute 0.5 0.0 - 0.5 K/uL   Basophils Relative 0 %   Basophils Absolute 0.0 0.0 - 0.1 K/uL   WBC Morphology INCREASED BANDS (>20% BANDS)     Comment: MILD LEFT SHIFT (1-5% METAS, OCC MYELO, OCC BANDS)   Smear Review PLATELET CLUMPS NOTED ON SMEAR, UNABLE TO ESTIMATE    Immature Granulocytes 9 %   Abs Immature Granulocytes 1.96 (H) 0.00 - 0.07 K/uL   Bite Cells PRESENT    Burr Cells PRESENT    Target Cells PRESENT     Comment: Performed at [facility], [address] [place], [place] [address]  Lipase, blood     Status: None   Collection Time: 12/29/18 12:23 PM  Result Value Ref Range   Lipase 27 11 - 51 U/L    Comment: Performed at [facility], [address] [place], [place] [address]  Brain natriuretic peptide     Status: Abnormal   Collection Time: 12/29/18 12:23 PM  Result Value Ref Range   B  Natriuretic Peptide 969.8 (H) 0.0 - 100.0 pg/mL    Comment: Performed at [facility], [address], [place], [place] [address]  APTT     Status: Abnormal   Collection Time: 12/29/18 12:23 PM  Result Value Ref Range   aPTT 44 (H) 24 - 36 seconds    Comment:        IF BASELINE aPTT IS ELEVATED, SUGGEST PATIENT RISK ASSESSMENT BE USED TO DETERMINE APPROPRIATE ANTICOAGULANT THERAPY. Performed at [facility], [address] [place], [place] [address]   Protime-INR     Status: Abnormal   Collection Time: 12/29/18 12:23 PM  Result Value Ref Range   Prothrombin Time 22.3 (H) 11.4 - 15.2 seconds   INR 1.98     Comment: Performed at [facility], [address] [place], [place] [address]  Magnesium     Status: None   Collection Time: 12/29/18 12:23 PM  Result Value Ref Range   Magnesium 2.3 1.7 - 2.4 mg/dL    Comment: Performed at [facility], [address] [place], [place] [address]  Phosphorus     Status: None   Collection Time: 12/29/18 12:23 PM  Result Value Ref Range   Phosphorus 3.3 2.5 - 4.6 mg/dL    Comment: Performed at [facility], [address] [place], [place] [address]  Influenza panel by PCR (type A & B)     Status: None   Collection Time: 12/29/18 12:25 PM  Result Value Ref Range   Influenza A By PCR NEGATIVE NEGATIVE   Influenza B By PCR NEGATIVE NEGATIVE    Comment: (NOTE) The Xpert Xpress Flu assay is intended as an aid in the diagnosis of  influenza and should not be used as a sole basis for treatment.  This  assay is FDA approved for nasopharyngeal swab specimens only. Nasal  washings and aspirates are unacceptable for Xpert Xpress Flu testing. Performed at [facility], [address] [place], [place] [address]   TSH     Status: None   Collection Time: 12/29/18 12:28 PM  Result Value Ref Range   TSH 1.744 0.350 - 4.500 uIU/mL     Comment: Performed by a 3rd Generation assay with a functional sensitivity of <=0.01 uIU/mL. Performed at [facility], [address], [place], [place] [address]   I-stat troponin, ED (not at [facility])     Status: Abnormal   Collection Time: 12/29/18  1:04 PM  Result Value Ref Range   Troponin i, poc 0.12 (HH) 0.00 - 0.08 ng/mL   Comment NOTIFIED PHYSICIAN    Comment 3            Comment: Due to the release kinetics of cTnI, a negative result within the first hours of the onset of symptoms does not rule out myocardial infarction with certainty. If myocardial infarction is still suspected, repeat the test at appropriate intervals.   Blood gas, venous ([facility], [facility], [facility])     Status: Abnormal   Collection Time: 12/29/18  1:08 PM  Result Value Ref Range   pH, Ven 7.251 7.250 - 7.430   pCO2, Ven 32.0 (L) 44.0 - 60.0 mmHg   pO2, Ven 57.4 (H) 32.0 - 45.0 mmHg   Bicarbonate 13.6 (L) 20.0 - 28.0 mmol/L   Acid-base deficit 12.2 (H) 0.0 - 2.0 mmol/L   O2 Saturation 83.9 %   Patient temperature 98.6    Collection site VEIN    Drawn by COLLECTED BY NURSE    Sample type VENOUS     Comment: Performed at [facility], [address] [place], [place] [address]  Lactic acid, plasma     Status: Abnormal   Collection Time: 12/29/18  2:23 PM  Result Value Ref Range   Lactic Acid, Venous 8.4 (HH) 0.5 - 1.9 mmol/L    Comment: CRITICAL RESULT CALLED TO, READ BACK BY AND VERIFIED WITH: JESSEE,B RN @1647  ON 12/29/2018 JACKSON,K Performed at [facility], [address] [place], [place] [address]   Urinalysis, Routine w reflex microscopic     Status: Abnormal   Collection Time: 12/29/18  3:35 PM  Result Value Ref Range   Color, Urine YELLOW YELLOW   APPearance HAZY (A) CLEAR   Specific Gravity, Urine 1.010 1.005 - 1.030   pH 7.0 5.0 - 8.0   Glucose, UA NEGATIVE NEGATIVE mg/dL   Hgb urine dipstick TRACE (A) NEGATIVE   Bilirubin Urine NEGATIVE  NEGATIVE   Ketones, ur NEGATIVE NEGATIVE mg/dL  Protein, ur TRACE (A) NEGATIVE mg/dL   Nitrite NEGATIVE NEGATIVE   Leukocytes, UA LARGE (A) NEGATIVE    Comment: Performed at [facility], [address] [place], [place] [address]  Urinalysis, Microscopic (reflex)     Status: Abnormal   Collection Time: 12/29/18  3:35 PM  Result Value Ref Range   RBC / HPF 0-5 0 - 5 RBC/hpf   WBC, UA 21-50 0 - 5 WBC/hpf   Bacteria, UA MANY (A) NONE SEEN   Squamous Epithelial / LPF 6-10 0 - 5   Granular Casts, UA PRESENT     Comment: Performed at [facility], [address] [place], [place] [address]  Type and screen [facility]     Status: None   Collection Time: 12/29/18  5:07 PM  Result Value Ref Range   ABO/RH(D) O POS    Antibody Screen NEG    Sample Expiration      01/01/2019 Performed at [facility], [address] [place], [place] [address]   POC CBG, ED     Status: None   Collection Time: 12/29/18  5:39 PM  Result Value Ref Range   Glucose-Capillary 78 70 - 99 mg/dL   Ct Abdomen Pelvis Wo Contrast  Result Date: 12/29/2018 CLINICAL DATA:  Abdomen distension two days ago here for food poisoning Allergic to iv contrast no oral contrast per MD EXAM: CT ABDOMEN AND PELVIS WITHOUT CONTRAST TECHNIQUE: Multidetector CT imaging of the abdomen and pelvis was performed following the standard protocol without IV contrast. COMPARISON:  12/27/2018 FINDINGS: Lower chest: There is bibasilar atelectasis. Hepatobiliary: The liver is homogeneous. Status post cholecystectomy. Pancreas: Pancreas is atrophic. Coarse calcifications throughout the pancreatic body and tail suggests history of chronic pancreatitis. Spleen: Normal in size without focal abnormality. Adrenals/Urinary Tract: Adrenal glands are unremarkable. Kidneys are normal, without renal calculi, focal lesion, or hydronephrosis. Bladder is unremarkable. Stomach/Bowel: Stomach  is unremarkable. Small bowel loops are normal in appearance. There are numerous colonic diverticula. No acute diverticulitis. Significant stool within the rectosigmoid colon. There has been some improvement in the appearance of thickening of the rectosigmoid colon. No perirectal abscess. Vascular/Lymphatic: There is dense atherosclerotic calcification of the abdominal aorta not associated with aneurysm. No retroperitoneal or mesenteric adenopathy. Reproductive: The uterus is present. Multiple mural calcifications are present. No adnexal mass. Other: No ascites. No abdominal wall hernia. Note is made of fat within the LEFT inguinal ring, likely a lipoma. Diffuse body wall edema. Musculoskeletal: Remote wedge compression fractures of T12, L1, and L3. There is 2 millimeters anterolisthesis of L4 on L5. Degenerative changes are identified at L4-5 and L5-S1. No suspicious lytic or blastic lesions are identified. IMPRESSION: 1. Significant stool within the rectosigmoid colon. There has been some improvement in the appearance of thickening of the rectosigmoid colon. 2. Diverticulosis without acute diverticulitis. 3. Status post cholecystectomy. 4. Changes of chronic pancreatitis. 5. Remote wedge compression fractures of T12, L1, and L3. Aortic Atherosclerosis (ICD10-I70.0). Electronically Signed   By: Norva PavlovElizabeth  Brown M.D.   On: 12/29/2018 13:12   Dg Chest Port 1 View  Result Date: 12/29/2018 CLINICAL DATA:  63 year old female with altered mental status, fever, decreased p.o. intake. EXAM: PORTABLE CHEST 1 VIEW COMPARISON:  Portable chest 11/13/2018 and earlier. FINDINGS: Portable AP semi upright view at 1320 hours. Sequelae of previous thoracic spine corpectomy and posterior fusion. Hardware appears stable. Stable visualized osseous structures. Stable lung volumes and mediastinal contours. Visualized tracheal air column is  within normal limits. Chronic increased pulmonary interstitial markings appear stable since 2019.  No pneumothorax, pleural effusion or acute pulmonary opacity. Mildly increased bowel gas in the abdomen. Stable cholecystectomy clips. IMPRESSION: Stable.  No acute cardiopulmonary abnormality. Electronically Signed   By: Odessa Fleming M.D.   On: 12/29/2018 14:03   EKG Interpretation  Date/Time:  /09/20 1226          Vitals/Pain Today's Vitals   12/29/18 1845 12/29/18 1900 12/29/18 1927 12/29/18 1929  BP: 98/72 (!) 83/58 (!) 84/63   Pulse:   (!) 108   Resp: (!) 23 (!) 21 20   Temp:      TempSrc:      SpO2:   97%   Weight:      Height:      PainSc:    8     Isolation Precautions No active isolations  Medications Medications  lactated ringers infusion ( Intravenous New Bag/Given 12/29/18 1330)  metroNIDAZOLE (FLAGYL) IVPB 500 mg (has no administration in time range)  phenylephrine (NEOSYNEPHRINE) 10-0.9 MG/250ML-% infusion (0 mcg/min Intravenous Hold 12/29/18 1712)  hydrocortisone sodium  succinate (SOLU-CORTEF) 100 MG injection 50 mg (50 mg Intravenous Given 12/29/18 1926)  insulin aspart (novoLOG) injection 1-3 Units (1 Units Subcutaneous Not Given 12/29/18 1756)  lactated ringers bolus 1,000 mL (1,000 mLs Intravenous New Bag/Given 12/29/18 1926)    And  lactated ringers bolus 250 mL (has no administration in time range)  vancomycin (VANCOCIN) IVPB 750 mg/150 ml premix (has no administration in time range)  ceFEPIme (MAXIPIME) 1 g in sodium chloride 0.9 % 100 mL IVPB (has no administration in time range)  ceFEPIme (MAXIPIME) 2 g in sodium chloride 0.9 % 100 mL IVPB (0 g Intravenous Stopped 12/29/18 1424)  vancomycin (VANCOCIN) IVPB 1000 mg/200 mL premix (0 mg Intravenous Stopped 12/29/18 1551)  lactated ringers bolus 250 mL (0 mLs Intravenous Stopped 12/29/18 1551)  potassium chloride 10 mEq in 100 mL IVPB (0 mEq Intravenous Stopped 12/29/18 1658)  potassium chloride SA (K-DUR,KLOR-CON) CR tablet 40 mEq (40 mEq Oral Given 12/29/18 1552)  lactated ringers bolus 500 mL (0 mLs Intravenous Stopped 12/29/18 1705)    Mobility walks

## 2018-12-29 NOTE — ED Notes (Signed)
[facility] and advised they would be arriving to transport shortly.

## 2018-12-29 NOTE — ED Triage Notes (Signed)
Patient comes from home. Patient arrived via [facility]. Patient is not ambulatory at all. Family called 911 due to patient being somewhat altered and decreased oral intake. Patient was seen two days ago on the 7th for food poisoning had low grade fever, was given tylenol and discharged. Patient was given 1gram Oral Tylenol before [facility]. Patient was given 5mg  of albuterol as well while in route.

## 2018-12-30 ENCOUNTER — Inpatient Hospital Stay (HOSPITAL_COMMUNITY): Payer: Medicaid Other

## 2018-12-30 LAB — TYPE AND SCREEN
ABO/RH(D): O POS
ABO/RH(D): O POS
Antibody Screen: NEGATIVE
Antibody Screen: POSITIVE
Unit division: 0

## 2018-12-30 LAB — BPAM RBC
Blood Product Expiration Date: 202003092359
Unit Type and Rh: 5100

## 2018-12-30 LAB — BASIC METABOLIC PANEL
Anion gap: 9 (ref 5–15)
BUN: 10 mg/dL (ref 6–20)
CO2: 12 mmol/L — ABNORMAL LOW (ref 22–32)
Calcium: 7.6 mg/dL — ABNORMAL LOW (ref 8.9–10.3)
Chloride: 123 mmol/L — ABNORMAL HIGH (ref 98–111)
Creatinine, Ser: 1.1 mg/dL — ABNORMAL HIGH (ref 0.44–1.00)
GFR calc Af Amer: >60 mL/min (ref >60)
GFR calc non Af Amer: 55 mL/min — ABNORMAL LOW (ref >60)
Glucose, Bld: 70 mg/dL (ref 70–99)
Potassium: 4.1 mmol/L (ref 3.5–5.1)
Sodium: 144 mmol/L (ref 135–145)

## 2018-12-30 LAB — CBC WITH DIFFERENTIAL/PLATELET
Abs Immature Granulocytes: 0.64 10*3/uL — ABNORMAL HIGH (ref 0.00–0.07)
Basophils Absolute: 0 10*3/uL (ref 0.0–0.1)
Basophils Relative: 0 %
Eosinophils Absolute: 0.1 10*3/uL (ref 0.0–0.5)
Eosinophils Relative: 0 %
HCT: 26 % — ABNORMAL LOW (ref 36.0–46.0)
Hemoglobin: 8.5 g/dL — ABNORMAL LOW (ref 12.0–15.0)
IMMATURE GRANULOCYTES: 3 %
LYMPHS ABS: 1.6 10*3/uL (ref 0.7–4.0)
Lymphocytes Relative: 7 %
MCH: 26.6 pg (ref 26.0–34.0)
MCHC: 32.7 g/dL (ref 30.0–36.0)
MCV: 81.3 fL (ref 80.0–100.0)
Monocytes Absolute: 0.9 10*3/uL (ref 0.1–1.0)
Monocytes Relative: 4 %
NEUTROS ABS: 19.9 10*3/uL — AB (ref 1.7–7.7)
Neutrophils Relative %: 86 %
PLATELETS: 72 10*3/uL — AB (ref 150–400)
RBC: 3.2 MIL/uL — ABNORMAL LOW (ref 3.87–5.11)
RDW: 16.2 % — ABNORMAL HIGH (ref 11.5–15.5)
WBC: 23.1 10*3/uL — ABNORMAL HIGH (ref 4.0–10.5)
nRBC: 0.1 % (ref 0.0–0.2)

## 2018-12-30 LAB — BLOOD CULTURE ID PANEL (REFLEXED)
Acinetobacter baumannii: NOT DETECTED
Candida albicans: NOT DETECTED
Candida glabrata: NOT DETECTED
Candida krusei: NOT DETECTED
Candida parapsilosis: NOT DETECTED
Candida tropicalis: NOT DETECTED
Carbapenem resistance: NOT DETECTED
ENTEROCOCCUS SPECIES: NOT DETECTED
Enterobacter cloacae complex: NOT DETECTED
Enterobacteriaceae species: DETECTED — AB
Escherichia coli: DETECTED — AB
Haemophilus influenzae: NOT DETECTED
Klebsiella oxytoca: NOT DETECTED
Klebsiella pneumoniae: NOT DETECTED
Listeria monocytogenes: NOT DETECTED
NEISSERIA MENINGITIDIS: NOT DETECTED
Proteus species: NOT DETECTED
Pseudomonas aeruginosa: NOT DETECTED
STREPTOCOCCUS PYOGENES: NOT DETECTED
STREPTOCOCCUS SPECIES: NOT DETECTED
Serratia marcescens: NOT DETECTED
Staphylococcus aureus (BCID): NOT DETECTED
Staphylococcus species: NOT DETECTED
Streptococcus agalactiae: NOT DETECTED
Streptococcus pneumoniae: NOT DETECTED

## 2018-12-30 LAB — LIPASE, BLOOD
Lipase: 21 U/L (ref 11–51)
Lipase: 22 U/L (ref 11–51)

## 2018-12-30 LAB — GLUCOSE, CAPILLARY
Glucose-Capillary: 76 mg/dL (ref 70–99)
Glucose-Capillary: 89 mg/dL (ref 70–99)
Glucose-Capillary: 90 mg/dL (ref 70–99)
Glucose-Capillary: 93 mg/dL (ref 70–99)
Glucose-Capillary: 151 mg/dL — ABNORMAL HIGH (ref 70–99)
Glucose-Capillary: 143 mg/dL — ABNORMAL HIGH (ref 70–99)

## 2018-12-30 LAB — CBC
HCT: 19.3 % — ABNORMAL LOW (ref 36.0–46.0)
Hemoglobin: 6.4 g/dL — CL (ref 12.0–15.0)
MCH: 26.6 pg (ref 26.0–34.0)
MCHC: 33.2 g/dL (ref 30.0–36.0)
MCV: 80.1 fL (ref 80.0–100.0)
Platelets: 84 10*3/uL — ABNORMAL LOW (ref 150–400)
RBC: 2.41 MIL/uL — ABNORMAL LOW (ref 3.87–5.11)
RDW: 16.6 % — ABNORMAL HIGH (ref 11.5–15.5)
WBC: 23.1 10*3/uL — ABNORMAL HIGH (ref 4.0–10.5)
nRBC: 0 % (ref 0.0–0.2)

## 2018-12-30 LAB — CORTISOL: Cortisol, Plasma: 17 ug/dL

## 2018-12-30 LAB — PROCALCITONIN: Procalcitonin: 5.58 ng/mL

## 2018-12-30 LAB — AMYLASE: Amylase: 20 U/L — ABNORMAL LOW (ref 28–100)

## 2018-12-30 LAB — PREPARE RBC (CROSSMATCH)

## 2018-12-30 LAB — LACTIC ACID, PLASMA
Lactic Acid, Venous: 3.1 mmol/L (ref 0.5–1.9)
Lactic Acid, Venous: 2.6 mmol/L (ref 0.5–1.9)

## 2018-12-30 MED ORDER — VALPROIC ACID 250 MG PO CAPS
250.0000 mg | ORAL_CAPSULE | Freq: Three times a day (TID) | ORAL | Status: DC
  Administered 2018-12-30 – 2019-01-01 (×7): 250 mg via ORAL
  Filled 2018-12-30 (×10): qty 1

## 2018-12-30 MED ORDER — ALUM & MAG HYDROXIDE-SIMETH 200-200-20 MG/5ML PO SUSP
30.0000 mL | ORAL | Status: DC | PRN
  Administered 2018-12-30: 30 mL via ORAL
  Filled 2018-12-30: qty 30

## 2018-12-30 MED ORDER — SIMETHICONE 80 MG PO CHEW
80.0000 mg | CHEWABLE_TABLET | Freq: Four times a day (QID) | ORAL | Status: DC | PRN
  Administered 2018-12-30: 80 mg via ORAL
  Filled 2018-12-30: qty 1

## 2018-12-30 MED ORDER — SODIUM CHLORIDE 0.9% IV SOLUTION: Freq: Once | INTRAVENOUS | Status: DC

## 2018-12-30 MED ORDER — ORAL CARE MOUTH RINSE
15.0000 mL | Freq: Two times a day (BID) | OROMUCOSAL | Status: DC
  Administered 2018-12-30 – 2019-01-15 (×23): 15 mL via OROMUCOSAL

## 2018-12-30 MED ORDER — ACETAMINOPHEN 325 MG PO TABS: 650.0000 mg | ORAL_TABLET | Freq: Four times a day (QID) | ORAL | Status: DC | PRN

## 2018-12-30 MED ORDER — SODIUM CHLORIDE 0.9 % IV SOLN
2.0000 g | INTRAVENOUS | Status: DC
  Administered 2018-12-30 – 2019-01-02 (×4): 2 g via INTRAVENOUS
  Filled 2018-12-30 (×5): qty 20

## 2018-12-30 MED ORDER — SODIUM CHLORIDE 0.9 % IV SOLN
INTRAVENOUS | Status: DC | PRN
  Administered 2018-12-30: 1000 mL via INTRAVENOUS

## 2018-12-30 MED ORDER — PANCRELIPASE (LIP-PROT-AMYL) 12000-38000 UNITS PO CPEP
24000.0000 [IU] | ORAL_CAPSULE | Freq: Three times a day (TID) | ORAL | Status: DC
  Administered 2018-12-30 – 2019-01-02 (×9): 24000 [IU] via ORAL
  Filled 2018-12-30 (×9): qty 2

## 2018-12-30 MED ORDER — APIXABAN 2.5 MG PO TABS
2.5000 mg | ORAL_TABLET | Freq: Two times a day (BID) | ORAL | Status: DC
  Administered 2018-12-30: 2.5 mg via ORAL
  Filled 2018-12-30: qty 1

## 2018-12-30 NOTE — Progress Notes (Signed)
Called to assess a complaint of abdominal pain. Patient denies pain or nausea tonight. Preliminary Abdominal xray findings show an ileus. Clips in the RUQ secondary to prior cholecystectomy. No other history of abdominal surgery. Exam shows tympany in the upper quadrants and no muscle guarding or tenderness.  Will give simethicone to alleviate abdominal pain, pending xray report.

## 2018-12-30 NOTE — Progress Notes (Signed)
eLink Physician-Brief Progress Note Patient Name: Amber [name] DOB: 1956-01-01 MRN: [id number]   Date of Service  12/30/2018  HPI/Events of Note  Notified of need for DVT prophylaxis. Platelet counts has been in 60's and 70's. Will avoid Heparin and Heparinoids.   eICU Interventions  Place SCD's.     Intervention Category Intermediate Interventions: Best-practice therapies (e.g. DVT, beta blocker, etc.)  Dastan Krider [name] 12/30/2018, 3:18 AM

## 2018-12-30 NOTE — Progress Notes (Addendum)
NAME:  Amber [name], MRN:  [id number], DOB:  1956-01-20, LOS: 1 ADMISSION DATE:  12/29/2018, CONSULTATION DATE:  12/29/2018 REFERRING MD:  EDP - Pfeiffer, CHIEF COMPLAINT:  Septic shock   Brief History   63 year old female with an extensive PMH who was discharged 2/8 from [facility] after being admitted for food poisoning and multiple electrolyte abnormalities.  Patient went home and started feeling unwell.  EMS was called and patient was noted to have a SBP of 60 and patient was given 1200 ml of NS and improved to the 90's.  Patient was also given a duoneb and her breathing was improved.  While in the ED the patient's SBP dropped again to the 80 and PCCM was consulted for admission.  Initial lactic acid was 5.5 and repeat is pending but patient improved with IVF.  Patient is a poor historian so history is attained from records.  Past Medical History  CVA, DM, Pancreatitis, h/o PAF  Significant Hospital Events   2/9 admission for hypotension 2/10: Hypotension resolved.  Starting clear liquids.  Antibiotics narrowed. Consults:  PCCM  Procedures:  N/A  Significant Diagnostic Tests:  CT of the abdomen that I reviewed myself showing no acute findings but multiple chronic changes  Micro Data:  Blood 2/9>>>GNR>>> Urine 2/9>>> Sputum 2/9>>> Stool culture 2/10>>> Gi PCR 2/13>>>  Antimicrobials:  Cefepime 2/9>>>2/10 Vanc 2/9>>>2/10 Flagyl 2/9>>> 2/10 Ceftriaxone 2/10>>  Interim history/subjective:  Feels better   Objective   Blood pressure 113/83, pulse 98, temperature 97.9 F (36.6 C), temperature source Oral, resp. rate 18, height 5' (1.524 m), weight 50.1 kg, SpO2 100 %.        Intake/Output Summary (Last 24 hours) at 12/30/2018 1120 Last data filed at 12/30/2018 1000 Gross per 24 hour  Intake 4423.79 ml  Output 360 ml  Net 4063.79 ml   Filed Weights   12/29/18 1229 12/29/18 2040  Weight: 40.5 kg 50.1 kg    Examination:  General: This is a pleasant  63 year old female she is resting comfortably in bed she is in no acute distress HEENT normocephalic atraumatic no jugular venous distention Pulmonary: Clear to auscultation without accessory use Cardiac: Regular rate and rhythm normal sinus rhythm on monitor Abdomen: Soft, slightly tender, no rebound tenderness.  Positive bowel sounds GU: Clear yellow Neuro: Awake oriented no focal deficit Extremities: Warm dry brisk cap refill no significant edema.  Resolved Hospital Problem list   N/A  Assessment & Plan:   Septic shock 2/2 infectious gastroenteritis w/ associated GNR bacteremia  Reflex ID showing  E. coli as causative organism Plan Antibiotic day #2, narrow to ceftriaxone Follow-up pending culture data Stool culture  Clear liq diet Hold antihypertensives and diuretics another 24 hrs   Chronic pancreatitis: Plan Trend lipase   Fluid, electrolyte and acid base imbalance: metabolic acidosis w/ Lactic acidosis: due to metformin use and sepsis, hypokalemia and hyperchloremia  Plan Hold metformin No further lactate trending needed F/u chem this am  Mild AKI Plan F/u chem   PAF Plan eliquis holding x 1 day for transfusion   DM: Plan Sliding scale insulin  Anemia Plan Transfuse   Chronic steroid use that the patient stopped abruptly due to abdominal pain, had been on this for gout Plan Stress dose steroids -  PCCM will admit to the ICU   Best practice DVT prophylaxis: Resuming Eliquis PUD prophylaxis PPI Activity: Out of bed CODE STATUS full code Glycemic control: Sliding scale insulin Disposition: No longer critically ill  we will move her out of the intensive care, and narrowing antibiotics.  Resuming her home regimen for her pancreatitis.  Also resume valproic acid.

## 2018-12-30 NOTE — Progress Notes (Signed)
Received call from lab, pts hgb:6.4. Relayed to CCM NP

## 2018-12-30 NOTE — Progress Notes (Signed)
eLink Physician-Brief Progress Note Patient Name: [name] DOB: 1956/04/05 MRN: [id number]   Date of Service  12/30/2018  HPI/Events of Note  Code status listed as "Prior". Last several documented code status orders were for FULL CODE.  eICU Interventions  Will change code status to FULL CODE.     Intervention Category Major Interventions: End of life / care limitation discussion  Lenell AntuSommer,Steven [name] 12/30/2018, 3:41 AM

## 2018-12-30 NOTE — Progress Notes (Signed)
PHARMACY - PHYSICIAN COMMUNICATION CRITICAL VALUE ALERT - BLOOD CULTURE IDENTIFICATION (BCID)  Amber [name] is an 63 y.o. female who presented to [facility] on 12/29/2018 with a chief complaint of sepsis.   Assessment:  Recent dc from [facility] for food poisoning.Presented here with sepsis and shock. Empiric abx with vanc/cefepime/flagyl to r/o abd source. BCID came back with e.coli this AM. Will message CCM team to narrow abx.   Name of physician (or Provider) Contacted: CCM team  Current antibiotics: vanc/cefepime/flagyl  Changes to prescribed antibiotics recommended:  Recommend optimizing to ceftriaxone 2g IV q24  Results for orders placed or performed during the hospital encounter of 12/29/18  Blood Culture ID Panel (Reflexed) (Collected: 12/29/2018 12:28 PM)  Result Value Ref Range   Enterococcus species NOT DETECTED NOT DETECTED   Listeria monocytogenes NOT DETECTED NOT DETECTED   Staphylococcus species NOT DETECTED NOT DETECTED   Staphylococcus aureus (BCID) NOT DETECTED NOT DETECTED   Streptococcus species NOT DETECTED NOT DETECTED   Streptococcus agalactiae NOT DETECTED NOT DETECTED   Streptococcus pneumoniae NOT DETECTED NOT DETECTED   Streptococcus pyogenes NOT DETECTED NOT DETECTED   Acinetobacter baumannii NOT DETECTED NOT DETECTED   Enterobacteriaceae species DETECTED (A) NOT DETECTED   Enterobacter cloacae complex NOT DETECTED NOT DETECTED   Escherichia coli DETECTED (A) NOT DETECTED   Klebsiella oxytoca NOT DETECTED NOT DETECTED   Klebsiella pneumoniae NOT DETECTED NOT DETECTED   Proteus species NOT DETECTED NOT DETECTED   Serratia marcescens NOT DETECTED NOT DETECTED   Carbapenem resistance NOT DETECTED NOT DETECTED   Haemophilus influenzae NOT DETECTED NOT DETECTED   Neisseria meningitidis NOT DETECTED NOT DETECTED   Pseudomonas aeruginosa NOT DETECTED NOT DETECTED   Candida albicans NOT DETECTED NOT DETECTED   Candida glabrata NOT DETECTED NOT  DETECTED   Candida krusei NOT DETECTED NOT DETECTED   Candida parapsilosis NOT DETECTED NOT DETECTED   Candida tropicalis NOT DETECTED NOT DETECTED    Ulyses Southward, PharmD, BCIDP, AAHIVP, CPP Infectious Disease Pharmacist 12/30/2018 10:31 AM

## 2018-12-30 NOTE — Progress Notes (Signed)
eLink Physician-Brief Progress Note Patient Name: Amber [name] DOB: [birth year] MRN: [id number]   Date of Service  12/30/2018  HPI/Events of Note  Patient c/o abdominal pain and tenderness.   eICU Interventions  Will order: 1. CBC with differential and platelets, Amylase and Lipase STAT. 2. Portable Abdominal Xray STAT. 3. Will ask ground team to evaluate abdomen at bedside.      Intervention Category Intermediate Interventions: Pain - evaluation and management  Sommer,Steven [name] 12/30/2018, 9:02 PM

## 2018-12-30 NOTE — Progress Notes (Signed)
eLink Physician-Brief Progress Note Patient Name: Amber [name] DOB: 04-Aug-1956 MRN: [id number]   Date of Service  12/30/2018  HPI/Events of Note  Patient c/o nausea and bloating. Creatinine = 1.10.   eICU Interventions  Will order: 1. Mylanta 30 mL Q 4 hours PRN      Intervention Category Major Interventions: Other:  Amber [name] 12/30/2018, 8:13 PM
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
## 2018-12-31 DIAGNOSIS — R109 Unspecified abdominal pain: Secondary | ICD-10-CM

## 2018-12-31 DIAGNOSIS — E11649 Type 2 diabetes mellitus with hypoglycemia without coma: Secondary | ICD-10-CM

## 2018-12-31 LAB — GASTROINTESTINAL PANEL BY PCR, STOOL (REPLACES STOOL CULTURE)
ASTROVIRUS: NOT DETECTED
Adenovirus F40/41: NOT DETECTED
Campylobacter species: NOT DETECTED
Cryptosporidium: NOT DETECTED
Cyclospora cayetanensis: NOT DETECTED
Entamoeba histolytica: NOT DETECTED
Enteroaggregative E coli (EAEC): NOT DETECTED
Enteropathogenic E coli (EPEC): NOT DETECTED
Enterotoxigenic E coli (ETEC): NOT DETECTED
Giardia lamblia: NOT DETECTED
Norovirus GI/GII: NOT DETECTED
Plesimonas shigelloides: NOT DETECTED
Rotavirus A: NOT DETECTED
SALMONELLA SPECIES: NOT DETECTED
Sapovirus (I, II, IV, and V): NOT DETECTED
Shiga like toxin producing E coli (STEC): NOT DETECTED
Shigella/Enteroinvasive E coli (EIEC): NOT DETECTED
VIBRIO SPECIES: NOT DETECTED
Vibrio cholerae: NOT DETECTED
Yersinia enterocolitica: NOT DETECTED

## 2018-12-31 LAB — TYPE AND SCREEN
ABO/RH(D): O POS
Antibody Screen: NEGATIVE
Unit division: 0

## 2018-12-31 LAB — COMPREHENSIVE METABOLIC PANEL
ALT: 21 U/L (ref 0–44)
AST: 40 U/L (ref 15–41)
Albumin: 1.7 g/dL — ABNORMAL LOW (ref 3.5–5.0)
Alkaline Phosphatase: 154 U/L — ABNORMAL HIGH (ref 38–126)
Anion gap: 7 (ref 5–15)
BUN: 11 mg/dL (ref 6–20)
CO2: 14 mmol/L — ABNORMAL LOW (ref 22–32)
CREATININE: 1.02 mg/dL — AB (ref 0.44–1.00)
Calcium: 8 mg/dL — ABNORMAL LOW (ref 8.9–10.3)
Chloride: 120 mmol/L — ABNORMAL HIGH (ref 98–111)
GFR calc Af Amer: >60 mL/min (ref >60)
GFR calc non Af Amer: 60 mL/min — ABNORMAL LOW (ref >60)
Glucose, Bld: 79 mg/dL (ref 70–99)
Potassium: 4.7 mmol/L (ref 3.5–5.1)
Sodium: 141 mmol/L (ref 135–145)
Total Bilirubin: 0.6 mg/dL (ref 0.3–1.2)
Total Protein: 4.6 g/dL — ABNORMAL LOW (ref 6.5–8.1)

## 2018-12-31 LAB — GLUCOSE, CAPILLARY
GLUCOSE-CAPILLARY: 85 mg/dL (ref 70–99)
Glucose-Capillary: 99 mg/dL (ref 70–99)
Glucose-Capillary: 75 mg/dL (ref 70–99)
Glucose-Capillary: 68 mg/dL — ABNORMAL LOW (ref 70–99)
Glucose-Capillary: 71 mg/dL (ref 70–99)
Glucose-Capillary: 111 mg/dL — ABNORMAL HIGH (ref 70–99)

## 2018-12-31 LAB — CBC
HCT: 24.3 % — ABNORMAL LOW (ref 36.0–46.0)
Hemoglobin: 8.1 g/dL — ABNORMAL LOW (ref 12.0–15.0)
MCH: 26.9 pg (ref 26.0–34.0)
MCHC: 33.3 g/dL (ref 30.0–36.0)
MCV: 80.7 fL (ref 80.0–100.0)
Platelets: 72 10*3/uL — ABNORMAL LOW (ref 150–400)
RBC: 3.01 MIL/uL — ABNORMAL LOW (ref 3.87–5.11)
RDW: 16.4 % — ABNORMAL HIGH (ref 11.5–15.5)
WBC: 21.6 10*3/uL — ABNORMAL HIGH (ref 4.0–10.5)
nRBC: 0 % (ref 0.0–0.2)

## 2018-12-31 LAB — TROPONIN I
TROPONIN I: 0.23 ng/mL — AB (ref <0.03)
Troponin I: 0.22 ng/mL (ref <0.03)
Troponin I: 0.2 ng/mL (ref <0.03)

## 2018-12-31 LAB — BPAM RBC
Blood Product Expiration Date: 202003092359
ISSUE DATE / TIME: 202002101738
Unit Type and Rh: 5100

## 2018-12-31 LAB — URINE CULTURE

## 2018-12-31 LAB — MAGNESIUM: Magnesium: 1.9 mg/dL (ref 1.7–2.4)

## 2018-12-31 LAB — PHOSPHORUS: Phosphorus: 3.9 mg/dL (ref 2.5–4.6)

## 2018-12-31 LAB — LACTIC ACID, PLASMA: Lactic Acid, Venous: 2.3 mmol/L (ref 0.5–1.9)

## 2018-12-31 LAB — PROCALCITONIN: Procalcitonin: 5.3 ng/mL

## 2018-12-31 MED ORDER — BOOST / RESOURCE BREEZE PO LIQD CUSTOM
1.0000 | Freq: Three times a day (TID) | ORAL | Status: DC
  Administered 2018-12-31 – 2019-01-10 (×28): 1 via ORAL

## 2018-12-31 MED ORDER — PANTOPRAZOLE SODIUM 40 MG PO TBEC: 40.0000 mg | DELAYED_RELEASE_TABLET | Freq: Two times a day (BID) | ORAL | Status: DC

## 2018-12-31 MED ORDER — ASPIRIN 81 MG PO CHEW
81.0000 mg | CHEWABLE_TABLET | Freq: Every day | ORAL | Status: DC
  Administered 2018-12-31 – 2019-01-01 (×2): 81 mg via ORAL
  Filled 2018-12-31 (×3): qty 1

## 2018-12-31 MED ORDER — PANCRELIPASE (LIP-PROT-AMYL) 12000-38000 UNITS PO CPEP: 24000.0000 [IU] | ORAL_CAPSULE | Freq: Three times a day (TID) | ORAL | Status: DC

## 2018-12-31 MED ORDER — DEXTROSE-NACL 5-0.9 % IV SOLN
INTRAVENOUS | Status: DC
  Administered 2018-12-31 – 2019-01-03 (×5): via INTRAVENOUS

## 2018-12-31 MED ORDER — PANTOPRAZOLE SODIUM 40 MG PO TBEC
40.0000 mg | DELAYED_RELEASE_TABLET | Freq: Every day | ORAL | Status: DC
  Administered 2018-12-31 – 2019-01-01 (×2): 40 mg via ORAL
  Filled 2018-12-31 (×2): qty 1

## 2018-12-31 MED ORDER — ALLOPURINOL 300 MG PO TABS
300.0000 mg | ORAL_TABLET | Freq: Every day | ORAL | Status: DC
  Administered 2018-12-31 – 2019-01-16 (×17): 300 mg via ORAL
  Filled 2018-12-31 (×17): qty 1

## 2018-12-31 MED ORDER — HYDROCORTISONE NA SUCCINATE PF 100 MG IJ SOLR
50.0000 mg | Freq: Two times a day (BID) | INTRAMUSCULAR | Status: DC
  Administered 2018-12-31 – 2019-01-01 (×3): 50 mg via INTRAVENOUS
  Filled 2018-12-31 (×3): qty 2

## 2018-12-31 MED ORDER — HYDROXYZINE HCL 10 MG PO TABS
10.0000 mg | ORAL_TABLET | Freq: Three times a day (TID) | ORAL | Status: DC | PRN
  Administered 2018-12-31 – 2019-01-08 (×3): 25 mg via ORAL
  Administered 2019-01-08 – 2019-01-09 (×2): 10 mg via ORAL
  Administered 2019-01-12: 20 mg via ORAL
  Administered 2019-01-13 – 2019-01-14 (×4): 25 mg via ORAL
  Administered 2019-01-14: 10 mg via ORAL
  Administered 2019-01-15: 20 mg via ORAL
  Administered 2019-01-16: 25 mg via ORAL
  Filled 2018-12-31 (×15): qty 3

## 2018-12-31 MED ORDER — ACETAMINOPHEN 325 MG PO TABS
650.0000 mg | ORAL_TABLET | Freq: Four times a day (QID) | ORAL | Status: DC | PRN
  Administered 2019-01-01 – 2019-01-16 (×36): 650 mg via ORAL
  Filled 2018-12-31 (×35): qty 2

## 2018-12-31 MED ORDER — APIXABAN 2.5 MG PO TABS
2.5000 mg | ORAL_TABLET | Freq: Two times a day (BID) | ORAL | Status: DC
  Administered 2018-12-31 – 2019-01-16 (×32): 2.5 mg via ORAL
  Filled 2018-12-31 (×32): qty 1

## 2018-12-31 MED ORDER — VALPROIC ACID 250 MG PO CAPS: 500.0000 mg | ORAL_CAPSULE | Freq: Three times a day (TID) | ORAL | Status: DC

## 2018-12-31 NOTE — Progress Notes (Signed)
CRITICAL VALUE ALERT  Critical Value:  Troponin 0.22   Date & Time Notied:  12/31/18 5:44 AM   Provider Notified: Dr. Arsenio Loader  Orders Received/Actions taken: 12 lead EKG, aspirin, repeat troponin, see new orders

## 2018-12-31 NOTE — Progress Notes (Signed)
ABG attempted by 2 RRT x3 attempts. Unable to obtain specimen at this time. Consulting civil engineer notified.

## 2018-12-31 NOTE — Progress Notes (Signed)
eLink Physician-Brief Progress Note Patient Name: Amber [name] DOB: 01/26/1956 MRN: [id number]   Date of Service  12/31/2018  HPI/Events of Note  Troponin = 0.22 - Test ordered yesterday afternoon. Indication????  eICU Interventions  Will order: 1. 12 Lead EKG now. 2. Continue to trend Troponin.  3. ASA 81 mg PO now and Q day.      Intervention Category Intermediate Interventions: Diagnostic test evaluation  [name] 12/31/2018, 5:49 AM

## 2018-12-31 NOTE — Progress Notes (Signed)
Initial Nutrition Assessment  DOCUMENTATION CODES:   Not applicable  INTERVENTION:   Boost Breeze po TID, each supplement provides 250 kcal and 9 grams of protein   NUTRITION DIAGNOSIS:   Inadequate oral intake related to altered GI function as evidenced by (inadequate diet).  GOAL:   Patient will meet greater than or equal to 90% of their needs  MONITOR:   Supplement acceptance, Diet advancement  REASON FOR ASSESSMENT:   Malnutrition Screening Tool    ASSESSMENT:   Pt with an PMH of pancreatitis, DM, chronic pain, CVA, SBO 11/2017, GERD, and constipation who was recently hospitalized at [facility] for food poisoning with multiple electrolyte abnormalities who was d/c now admitted with septic shock.     GI workup pending. Meal Completion: 75% of clear liquids  Noted pt having abd pain last night, abd xray notes ileus Per RN pt has to be fed but has consumed most of her clear liquids this admission.  During my visit her lunch tray was on her bedside table untouched.  Pt would not wake during my visit but did moan a few times during exam.  Physical exam is unreliable as pt with moderate to severe edema on BLE and BUE.   Medications reviewed and include: solu-cortef, 1-3 units novolog every 4 hours, creon TID before meals Labs reviewed: CBG: 68-111 Pt is up 10 kg based on her last weight of 40.5 kg on 10/14/18 + 9 L  NUTRITION - FOCUSED PHYSICAL EXAM:    Most Recent Value  Orbital Region  No depletion  Upper Arm Region  No depletion  Thoracic and Lumbar Region  No depletion  Buccal Region  No depletion  Temple Region  No depletion  Clavicle Bone Region  No depletion  Clavicle and Acromion Bone Region  No depletion  Scapular Bone Region  Unable to assess  Dorsal Hand  No depletion  Patellar Region  No depletion  Anterior Thigh Region  No depletion  Posterior Calf Region  No depletion  Edema (RD Assessment)  Severe  Hair  Reviewed  Eyes  Unable to assess   Mouth  Unable to assess  Skin  Reviewed  Nails  Reviewed       Diet Order:   Diet Order            Diet clear liquid Room service appropriate? Yes; Fluid consistency: Thin  Diet effective now              EDUCATION NEEDS:   No education needs have been identified at this time  Skin:  Skin Assessment: Skin Integrity Issues: Skin Integrity Issues:: Stage I Stage I: sacrum  Last BM:  2/11 large type 7  Height:   Ht Readings from Last 1 Encounters:  12/29/18 5' (1.524 m)    Weight:   Wt Readings from Last 1 Encounters:  12/29/18 50.1 kg    Ideal Body Weight:  45.4 kg  BMI:  Body mass index is 21.57 kg/m.  Estimated Nutritional Needs:   Kcal:  1300-1500  Protein:  65-75 grams  Fluid:  >1.5 L/day  Kendell Bane RD, LDN, CNSC [phone] Pager [phone] After Hours Pager

## 2018-12-31 NOTE — Care Management Note (Signed)
Case Management Note Hortencia ConradiWendi Gary Bultman, RN MSN CCM Transitions of Care [place] [phone]  Patient Details  Name: Amber FlashBessie Marquis MRN: [id number] Date of Birth: [birth year]  Subjective/Objective:            Septic shock       Action/Plan: PTA pt home alone with PCS caregivers 2-3 hrs/day 6 days a week. [facility] the supervisor over the aids-[phone]. Julieanne CottonJosephine stated that the patient is home alone at night, and when aides arrive in the morning the patient is a mess lying in her own urine/feces. Spoke CSW for f/u. Noted patient is to transfer to [phone]. CM to follow for transition of care needs.   Expected Discharge Date:                  Expected Discharge Plan:  Home w Home Health Services  In-House Referral:  Clinical Social Work  Discharge planning Services  CM Consult  Post Acute Care Choice:    Choice offered to:     DME Arranged:    DME Agency:     HH Arranged:    HH Agency:     Status of Service:  In process, will continue to follow  If discussed at Long Length of Stay Meetings, dates discussed:    Additional Comments:  Bess KindsWendi B Prabhleen Montemayor, RN 12/31/2018, 3:37 PM

## 2018-12-31 NOTE — Progress Notes (Signed)
Pt coughs when she drinks liquids. Pt put on 2L [place] due to oxygen sat at 90% on room air.

## 2018-12-31 NOTE — Progress Notes (Signed)
Hypoglycemic Event  CBG: 68  Treatment: 4 oz of juice  Symptoms: N/A  Follow-up CBG: BLTJ:0300 CBG Result:71  Possible Reasons for Event:  Comments/MD notified:McClung    Ubaldo Glassing

## 2018-12-31 NOTE — Progress Notes (Signed)
[name] TEAM 1 - Stepdown/ICU TEAM  Amber [name]  ZOX:[id number] DOB: 06/23/1956 DOA: 12/29/2018 PCP: Salli RealSun, Yun, MD    Brief Narrative:  63 year old female with an extensive PMH who was discharged 2/8 from [facility] after being admitted for food poisoning and multiple electrolyte abnormalities. Patient went home and started feeling unwell. EMS was called and patient was noted to have a SBP of 60. While in the ED the patient's SBP dropped again to 80 and PCCM was consulted for admission.  Initial lactic acid was 5.5.  Significant Events: 2/9 admission for hypotension 2/10 hypotension resolved.  Started clear liquids.  Antibiotics narrowed. 2/11 TRH assumed care   Subjective: The patient is lethargic.  She will open her eyes and answer some simple questions but other times simply mumbles.  She does not appear to be in acute respiratory distress.  She has had recurring hypoglycemia this afternoon.  Assessment & Plan:  Septic shock 2/2 E coli gastroenteritis w/ bacteremia  Continue directed abx tx - hemodynamically stable at this time   Chronic pancreatitis - Chronic pain  No indication of uncontrolled pain at present   Lactic acidosis due to metformin use + sepsis - recheck lactic acid (last checked yesterday at 08:56) - keep hydrated - avoiding metformin   Hypokalemia  Corrected  Elevated troponin No clear explanation for why this was checked - no indication/suspicion for chest pain - follow trend - likely simple demand ischemia   Mild AKI  Essentially resolved w/ volume expansion - follow trend   PAF NSR at present - CHADSVASC 5 - appears she was on Eliquis prior to admit - resume today and follow Hgb   DM2 w/ hypoglycemia  Poor intake - add dextrose IVF and follow   Anemia Chronic - follow - no evidence of acute blood loss   Chronic steroid use  Pt had stopped abruptly due to abdom pain - cont on stress dose for now   Seizure D/O Continue usual  meds   DVT prophylaxis: Eliquis Code Status: FULL CODE Family Communication: no family present at time of exam  Disposition Plan: SDU  Consultants:  PCCM  Antimicrobials:  Cefepime 2/9 > 2/10 Vanc 2/9>>>2/10 Flagyl 2/9>>> 2/10 Ceftriaxone 2/10 >  Objective: Blood pressure (!) 135/100, pulse 84, temperature 97.6 F (36.4 C), temperature source Oral, resp. rate 14, height 5' (1.524 m), weight 50.1 kg, SpO2 94 %.  Intake/Output Summary (Last 24 hours) at 12/31/2018 1543 Last data filed at 12/31/2018 1400 Gross per 24 hour  Intake 3854.32 ml  Output 110 ml  Net 3744.32 ml   Filed Weights   12/29/18 1229 12/29/18 2040  Weight: 40.5 kg 50.1 kg    Examination: General: No acute respiratory distress - lethargic  Lungs: Clear to auscultation bilaterally without wheezes or crackles Cardiovascular: Regular rate and rhythm without murmur gallop or rub normal S1 and S2 Abdomen: Nontender, nondistended, soft, bowel sounds positive, no rebound, no ascites, no appreciable mass Extremities: No significant cyanosis, clubbing, or edema bilateral lower extremities  CBC: Recent Labs  Lab 12/29/18 1223 12/30/18 1155 12/30/18 2204 12/31/18 0405  WBC 22.4* 23.1* 23.1* 21.6*  NEUTROABS 15.0*  --  19.9*  --   HGB 7.2* 6.4* 8.5* 8.1*  HCT 22.6* 19.3* 26.0* 24.3*  MCV 83.1 80.1 81.3 80.7  PLT PLATELET CLUMPS NOTED ON SMEAR, UNABLE TO ESTIMATE 84* 72* 72*   Basic Metabolic Panel: Recent Labs  Lab 12/29/18 1223 12/29/18 2230 12/30/18 1155 12/31/18 0405  NA 140 136 144 141  K 2.7* 3.5 4.1 4.7  CL 119* 114* 123* 120*  CO2 15* 12* 12* 14*  GLUCOSE 66* 96 70 79  BUN CREATININE 1.39* 1.18* 1.10* 1.02*  CALCIUM 7.5* 7.1* 7.6* 8.0*  MG 2.3  --   --  1.9  PHOS 3.3  --   --  3.9   GFR: Estimated Creatinine Clearance: 42.1 mL/min (A) (by C-G formula based on SCr of 1.02 mg/dL (H)).  Liver Function Tests: Recent Labs  Lab 12/29/18 1223 12/31/18 0405  AST 66* 40  ALT  24 21  ALKPHOS 212* 154*  BILITOT 1.9* 0.6  PROT 4.5* 4.6*  ALBUMIN 1.7* 1.7*   Recent Labs  Lab 12/29/18 1223 12/30/18 1155 12/30/18 2204  LIPASE AMYLASE  --   --  20*    Coagulation Profile: Recent Labs  Lab 12/29/18 1223  INR 1.98    Cardiac Enzymes: Recent Labs  Lab 12/31/18 0405 12/31/18 1043  TROPONINI 0.22* 0.23*    HbA1C: Hgb A1c MFr Bld  Date/Time Value Ref Range Status  05/08/2018 11:14 AM 5.7 (H) 4.8 - 5.6 % Final    Comment:    (NOTE) Pre diabetes:          5.7%-6.4% Diabetes:              >6.4% Glycemic control for   <7.0% adults with diabetes   10/27/2016 05:32 AM 4.7 (L) 4.8 - 5.6 % Final    Comment:    (NOTE)         Pre-diabetes: 5.7 - 6.4         Diabetes: >6.4         Glycemic control for adults with diabetes: <7.0     CBG: Recent Labs  Lab 12/31/18 0351 12/31/18 0739 12/31/18 0824 12/31/18 1159 12/31/18 1536  GLUCAP 75 68* 71 111* 85    Recent Results (from the past 240 hour(s))  Blood Culture (routine x 2)     Status: None (Preliminary result)   Collection Time: 12/29/18 12:23 PM  Result Value Ref Range Status   Specimen Description BLOOD RIGHT FOREARM  Final   Special Requests   Final    BOTTLES DRAWN AEROBIC AND ANAEROBIC Blood Culture adequate volume Performed at [facility], [address], [place], [place] [address]    Culture   Final    NO GROWTH 2 DAYS Performed at [facility], [address] [place], [place] [address]    Report Status PENDING  Incomplete  Urine culture     Status: Abnormal   Collection Time: 12/29/18 12:24 PM  Result Value Ref Range Status   Specimen Description   Final    URINE, RANDOM Performed at [facility], [address], [place], [place] [address]    Special Requests   Final    NONE Performed at [facility], [address], [place], [place] [address]    Culture MULTIPLE SPECIES PRESENT, SUGGEST  RECOLLECTION (A)  Final   Report Status 12/31/2018 FINAL  Final  Blood Culture (routine x 2)     Status: Abnormal (Preliminary result)   Collection Time: 12/29/18 12:28 PM  Result Value Ref Range Status   Specimen Description BLOOD RIGHT ARM  Final   Special Requests   Final    BOTTLES DRAWN AEROBIC AND ANAEROBIC Blood Culture adequate volume Performed at [facility], [address] [place], [place] [address]    Culture  Setup Time   Final    GRAM NEGATIVE RODS ANAEROBIC BOTTLE ONLY Organism ID to follow CRITICAL RESULT CALLED TO, READ BACK BY AND VERIFIED WITH: Gay Filler PharmD 9:45 12/30/18 ([email or web address])    Culture (A)  Final    ESCHERICHIA COLI SUSCEPTIBILITIES TO FOLLOW Performed at [facility], [address] [place], [place] [address]    Report Status PENDING  Incomplete  Blood Culture ID Panel (Reflexed)     Status: Abnormal   Collection Time: 12/29/18 12:28 PM  Result Value Ref Range Status   Enterococcus species NOT DETECTED NOT DETECTED Final   Listeria monocytogenes NOT DETECTED NOT DETECTED Final   Staphylococcus species NOT DETECTED NOT DETECTED Final   Staphylococcus aureus (BCID) NOT DETECTED NOT DETECTED Final   Streptococcus species NOT DETECTED NOT DETECTED Final   Streptococcus agalactiae NOT DETECTED NOT DETECTED Final   Streptococcus pneumoniae NOT DETECTED NOT DETECTED Final   Streptococcus pyogenes NOT DETECTED NOT DETECTED Final   Acinetobacter baumannii NOT DETECTED NOT DETECTED Final   Enterobacteriaceae species DETECTED (A) NOT DETECTED Final    Comment: Enterobacteriaceae represent a large family of gram-negative bacteria, not a single organism. CRITICAL RESULT CALLED TO, READ BACK BY AND VERIFIED WITH: Gay Filler PharmD 9:45 12/30/18 ([email or web address])    Enterobacter cloacae complex NOT DETECTED NOT DETECTED Final   Escherichia coli DETECTED (A) NOT DETECTED Final    Comment: CRITICAL RESULT CALLED TO, READ BACK BY AND VERIFIED WITH: Gay Filler PharmD 9:45 12/30/18 ([email or web address])    Klebsiella oxytoca NOT DETECTED NOT DETECTED Final   Klebsiella pneumoniae NOT DETECTED NOT DETECTED Final   Proteus species NOT DETECTED NOT DETECTED Final   Serratia marcescens NOT DETECTED NOT DETECTED Final   Carbapenem resistance NOT DETECTED NOT DETECTED Final   Haemophilus influenzae NOT DETECTED NOT DETECTED Final   Neisseria meningitidis NOT DETECTED NOT DETECTED Final   Pseudomonas aeruginosa NOT DETECTED NOT DETECTED Final   Candida albicans NOT DETECTED NOT DETECTED Final   Candida glabrata NOT DETECTED NOT DETECTED Final   Candida krusei NOT DETECTED NOT DETECTED Final   Candida parapsilosis NOT DETECTED NOT DETECTED Final   Candida tropicalis NOT DETECTED NOT DETECTED Final    Comment: Performed at [facility], [address], [place], [place] [address]  MRSA PCR Screening     Status: None   Collection Time: 12/29/18  8:36 PM  Result Value Ref Range Status   MRSA by PCR NEGATIVE NEGATIVE Final    Comment:        The GeneXpert MRSA Assay (FDA approved for NASAL specimens only), is one component of a comprehensive MRSA colonization surveillance program. It is not intended to diagnose MRSA infection nor to guide or monitor treatment for MRSA infections. Performed at [facility], [address], [place], [place] [address]   Gastrointestinal Panel by PCR , Stool     Status: None   Collection Time: 12/30/18 10:17 PM  Result Value Ref Range Status   Campylobacter species NOT DETECTED NOT DETECTED Final   Plesimonas shigelloides NOT DETECTED NOT DETECTED Final   Salmonella species NOT DETECTED NOT DETECTED Final   Yersinia enterocolitica NOT DETECTED NOT DETECTED Final   Vibrio species NOT DETECTED NOT DETECTED Final   Vibrio cholerae NOT DETECTED NOT DETECTED Final   Enteroaggregative E coli (EAEC) NOT DETECTED NOT DETECTED Final   Enteropathogenic E coli (EPEC) NOT DETECTED NOT DETECTED  Final   Enterotoxigenic E  coli (ETEC) NOT DETECTED NOT DETECTED Final   Shiga like toxin producing E coli (STEC) NOT DETECTED NOT DETECTED Final   Shigella/Enteroinvasive E coli (EIEC) NOT DETECTED NOT DETECTED Final   Cryptosporidium NOT DETECTED NOT DETECTED Final   Cyclospora cayetanensis NOT DETECTED NOT DETECTED Final   Entamoeba histolytica NOT DETECTED NOT DETECTED Final   Giardia lamblia NOT DETECTED NOT DETECTED Final   Adenovirus F40/41 NOT DETECTED NOT DETECTED Final   Astrovirus NOT DETECTED NOT DETECTED Final   Norovirus GI/GII NOT DETECTED NOT DETECTED Final   Rotavirus A NOT DETECTED NOT DETECTED Final   Sapovirus (I, II, IV, and V) NOT DETECTED NOT DETECTED Final    Comment: Performed at [facility], [address] [place], [place] [address]     Scheduled Meds: . sodium chloride   Intravenous Once  . aspirin  81 mg Oral Daily  . feeding supplement  1 Container Oral TID BM  . hydrocortisone sod succinate (SOLU-CORTEF) inj  50 mg Intravenous Q6H  . insulin aspart  1-3 Units Subcutaneous Q4H  . lipase/protease/amylase  24,000 Units Oral TID AC  . mouth rinse  15 mL Mouth Rinse BID  . valproic acid  250 mg Oral TID   Continuous Infusions: . sodium chloride 10 mL/hr at 12/30/18 0536  . cefTRIAXone (ROCEPHIN)  IV Stopped (12/31/18 1308)  . lactated ringers    . lactated ringers 125 mL/hr at 12/31/18 1512     LOS: 2 days   Lonia BloodJeffrey T. Marck Mcclenny, MD [facility] Office  [phone] Pager - Text Page per Amion  If 7PM-7AM, please contact night-coverage per Amion 12/31/2018, 3:43 PM

## 2018-12-31 NOTE — Progress Notes (Signed)
Pt admitted via bed from 78M in NAD.  Tele reapplied and reading SR. Pt  oriented to room and floor. Foam drsg intact to st 1 to sacrum. Pt repositioned on back in bed..Bed alarm on.  Enteric precautions while gi panel pending.

## 2018-12-31 NOTE — Evaluation (Signed)
Physical Therapy Evaluation Patient Details Name: Amber [name] MRN: [id number] DOB: 03-15-1956 Today's Date: 12/31/2018   History of Present Illness  63 y.o. female admitted 12/29/18 for hypotension with recent admission to [facility] for food poisoning and electrolyte abnormalities (d/c home on 12/28/18).  Pt came to ED not feeling well and found to be hyptensive.  PT dx with septic shock due to E coli gastroenteritis with bacteremia, lactic acidosis, hypokalemia, elevated troponin (suspected demand ischemia), mild AKI, and multiple episodes of hypoglycemia (h/o DM and poor po intake).  Pt with other significant PMH of HTN, embolic stroke, focal seizure, ACS, frequent falls and back surgery.   Clinical Impression  Pt is very rigid in the bed with poor 4 extremity ROM and strength requiring total assist for bed mobility and transition to sitting EOB.  She opened her eyes to her name, but generally kept them closed the whole session, following some one step commands to the best of her physical ability.   PT to follow acutely for deficits listed below.      Follow Up Recommendations SNF    Equipment Recommendations  Hospital bed    Recommendations for Other Services   NA    Precautions / Restrictions Precautions Precautions: Fall Restrictions Weight Bearing Restrictions: No      Mobility  Bed Mobility Overal bed mobility: Needs Assistance Bed Mobility: Rolling;Sidelying to Sit;Sit to Supine Rolling: Total assist;+2 for physical assistance Sidelying to sit: Total assist;+2 for physical assistance   Sit to supine: Total assist;+2 for physical assistance   General bed mobility comments: Total assist to roll bil for peri care and come to sitting EOB and transition back to supine.  Pt unable to help.   Transfers                 General transfer comment: NT as pt is not currently strong enough.                   Balance Overall balance assessment:  Needs assistance Sitting-balance support: Feet supported;No upper extremity supported Sitting balance-Leahy Scale: Poor Sitting balance - Comments: Needs mod assist, but can be released for close supervision and pt does kick in some trunk control when let go for ~10-15 seconds before LOB R and posteriorly.   Postural control: Right lateral lean;Posterior lean                                   Pertinent Vitals/Pain Pain Assessment: Faces Faces Pain Scale: Hurts even more Pain Location: with ROM of extremities Pain Descriptors / Indicators: Grimacing;Guarding Pain Intervention(s): Limited activity within patient's tolerance;Monitored during session;Repositioned    Home Living Family/patient expects to be discharged to:: Private residence Living Arrangements: Other relatives Available Help at Discharge: Family;Available PRN/intermittently;Personal care attendant Type of Home: Apartment Home Access: Stairs to enter Entrance Stairs-Rails: None Entrance Stairs-Number of Steps: 5 Home Layout: One level Home Equipment: Tub bench;Walker - 4 wheels;Bedside commode Additional Comments: lives with grandson and son around the corner, has aide come 7x a week, has help from son with ADL and IADls, ambulates with RW- info taken from previous encounter.    Prior Function Level of Independence: Needs assistance   Gait / Transfers Assistance Needed: Pt was max two person stand pivot from admission in Nov 2019, SNF was recommended at that time.  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            Hand Dominance   Dominant Hand: Right    Extremity/Trunk Assessment   Upper Extremity Assessment Upper Extremity Assessment: RUE deficits/detail;LUE deficits/detail RUE Deficits / Details: bil UE are stiff R>L (not sure which side her stroke affected) with  grimacing at end ROM elbow and shoulder.  Pt did not follow command to squeeze my hand on either side.  LUE Deficits / Details: bil UE are stiff R>L (not sure which side her stroke affected) with grimacing at end ROM elbow and shoulder.  Pt did not follow command to squeeze my hand on either side.     Lower Extremity Assessment Lower Extremity Assessment: RLE deficits/detail;LLE deficits/detail RLE Deficits / Details: bil LEs with decreased DF ROM, knee flexion ROM, and hip abduction ROM.  DF unable to get to neutral bil, and knee flexion ~30 degrees L and 45 degrees R with palpable joint crepitus on the R.  Seated EOB when asked to kick her legs she is able to do so bil trace 1/5.   LLE Deficits / Details: bil LEs with decreased DF ROM, knee flexion ROM, and hip abduction ROM.  DF unable to get to neutral bil, and knee flexion ~30 degrees L and 45 degrees R with palpable joint crepitus on the R.  Seated EOB when asked to kick her legs she is able to do so bil trace 1/5.      Cervical / Trunk Assessment Cervical / Trunk Assessment: Other exceptions Cervical / Trunk Exceptions: Very stiff, rounded, edematous.  Communication   Communication: Expressive difficulties  Cognition Arousal/Alertness: Lethargic Behavior During Therapy: Flat affect Overall Cognitive Status: Impaired/Different from baseline Area of Impairment: Orientation;Attention;Memory;Following commands;Safety/judgement;Awareness;Problem solving                 Orientation Level: Disoriented to;Time;Situation Current Attention Level: Focused Memory: Decreased recall of precautions;Decreased short-term memory Following Commands: Follows one step commands inconsistently;Follows one step commands with increased time Safety/Judgement: Decreased awareness of safety;Decreased awareness of deficits Awareness: Intellectual Problem Solving: Slow processing;Decreased initiation;Difficulty sequencing;Requires verbal cues;Requires  tactile cues General Comments: Pt with eyes closed most of the session, opens them breifly to her name, stated "[place]" when asked where she was.        General Comments General comments (skin integrity, edema, etc.): DOE 3/4 EOB, O2 sats on RA stable as well as HR.  BP 140s/110s (RN made aware).         Assessment/Plan    PT Assessment Patient needs continued PT services  PT Problem List Decreased strength;Decreased range of motion;Decreased activity tolerance;Decreased balance;Decreased mobility;Decreased coordination;Decreased cognition;Decreased knowledge of use of DME;Decreased safety awareness;Decreased knowledge of precautions       PT Treatment Interventions DME instruction;Gait training;Stair training;Functional mobility training;Therapeutic activities;Therapeutic exercise;Balance training;Cognitive remediation;Patient/family education;Neuromuscular re-education;Wheelchair mobility training;Manual techniques    PT Goals (Current goals can be found in the Care Plan section)  Acute Rehab PT Goals Patient Stated Goal: unable to state PT Goal Formulation: Patient unable to participate in goal setting Time For Goal Achievement: 01/14/19 Potential to Achieve Goals: Fair    Frequency Min 2X/week           AM-PAC PT "6 Clicks" Mobility  Outcome Measure Help needed turning from your back to your side while in a flat bed without using bedrails?: Total Help needed moving from lying on your back to sitting  on the side of a flat bed without using bedrails?: Total Help needed moving to and from a bed to a chair (including a wheelchair)?: Total Help needed standing up from a chair using your arms (e.g., wheelchair or bedside chair)?: Total Help needed to walk in hospital room?: Total Help needed climbing 3-5 steps with a railing? : Total 6 Click Score: 6    End of Session   Activity Tolerance: Patient limited by pain;Patient limited by lethargy Patient left: in bed;with call  bell/phone within reach;with bed alarm set Nurse Communication: Mobility status PT Visit Diagnosis: Muscle weakness (generalized) (M62.81);Difficulty in walking, not elsewhere classified (R26.2)    Time: [id number] PT Time Calculation (min) (ACUTE ONLY): 28 min   Charges:         Lurena Joinerebecca B. Clarke Amburn, PT, DPT  Acute Rehabilitation [phone] pager #[phone] office   PT Evaluation $PT Eval Moderate Complexity: 1 Mod PT Treatments $Therapeutic Activity: 8-22 mins        12/31/2018, 11:45 PM

## 2018-12-31 NOTE — Progress Notes (Signed)
Pt received bed, room [phone], called report to [facility]. Patient will be transferred on the monitor.

## 2019-01-01 ENCOUNTER — Inpatient Hospital Stay (HOSPITAL_COMMUNITY): Payer: Medicaid Other

## 2019-01-01 DIAGNOSIS — I480 Paroxysmal atrial fibrillation: Secondary | ICD-10-CM

## 2019-01-01 LAB — CULTURE, BLOOD (ROUTINE X 2): SPECIAL REQUESTS: ADEQUATE

## 2019-01-01 LAB — CBC
HCT: 28.5 % — ABNORMAL LOW (ref 36.0–46.0)
Hemoglobin: 9.5 g/dL — ABNORMAL LOW (ref 12.0–15.0)
MCH: 26.5 pg (ref 26.0–34.0)
MCHC: 33.3 g/dL (ref 30.0–36.0)
MCV: 79.4 fL — ABNORMAL LOW (ref 80.0–100.0)
Platelets: 69 10*3/uL — ABNORMAL LOW (ref 150–400)
RBC: 3.59 MIL/uL — ABNORMAL LOW (ref 3.87–5.11)
RDW: 16.7 % — ABNORMAL HIGH (ref 11.5–15.5)
WBC: 19.8 10*3/uL — ABNORMAL HIGH (ref 4.0–10.5)
nRBC: 0.1 % (ref 0.0–0.2)

## 2019-01-01 LAB — GLUCOSE, CAPILLARY
GLUCOSE-CAPILLARY: 144 mg/dL — AB (ref 70–99)
Glucose-Capillary: 146 mg/dL — ABNORMAL HIGH (ref 70–99)
Glucose-Capillary: 126 mg/dL — ABNORMAL HIGH (ref 70–99)
Glucose-Capillary: 159 mg/dL — ABNORMAL HIGH (ref 70–99)

## 2019-01-01 LAB — COMPREHENSIVE METABOLIC PANEL
ALT: 24 U/L (ref 0–44)
AST: 41 U/L (ref 15–41)
Albumin: 1.8 g/dL — ABNORMAL LOW (ref 3.5–5.0)
Alkaline Phosphatase: 171 U/L — ABNORMAL HIGH (ref 38–126)
Anion gap: 8 (ref 5–15)
BUN: 11 mg/dL (ref 6–20)
CO2: 17 mmol/L — ABNORMAL LOW (ref 22–32)
Calcium: 7.9 mg/dL — ABNORMAL LOW (ref 8.9–10.3)
Chloride: 119 mmol/L — ABNORMAL HIGH (ref 98–111)
Creatinine, Ser: 0.95 mg/dL (ref 0.44–1.00)
GFR calc Af Amer: >60 mL/min (ref >60)
GFR calc non Af Amer: >60 mL/min (ref >60)
Glucose, Bld: 136 mg/dL — ABNORMAL HIGH (ref 70–99)
Potassium: 4.1 mmol/L (ref 3.5–5.1)
Sodium: 144 mmol/L (ref 135–145)
Total Bilirubin: 0.6 mg/dL (ref 0.3–1.2)
Total Protein: 5 g/dL — ABNORMAL LOW (ref 6.5–8.1)

## 2019-01-01 LAB — FOLATE: Folate: 13.1 ng/mL (ref >5.9)

## 2019-01-01 LAB — AMMONIA: Ammonia: 53 umol/L — ABNORMAL HIGH (ref 9–35)

## 2019-01-01 LAB — VITAMIN B12: Vitamin B-12: 898 pg/mL (ref 180–914)

## 2019-01-01 LAB — TROPONIN I: Troponin I: 0.34 ng/mL (ref <0.03)

## 2019-01-01 LAB — MAGNESIUM: Magnesium: 1.9 mg/dL (ref 1.7–2.4)

## 2019-01-01 LAB — LACTIC ACID, PLASMA: Lactic Acid, Venous: 1.6 mmol/L (ref 0.5–1.9)

## 2019-01-01 LAB — PHOSPHORUS: Phosphorus: 3.2 mg/dL (ref 2.5–4.6)

## 2019-01-01 MED ORDER — BISACODYL 10 MG RE SUPP
10.0000 mg | Freq: Once | RECTAL | Status: AC
  Administered 2019-01-01: 10 mg via RECTAL
  Filled 2019-01-01: qty 1

## 2019-01-01 MED ORDER — POLYETHYLENE GLYCOL 3350 17 G PO PACK
17.0000 g | PACK | Freq: Two times a day (BID) | ORAL | Status: DC
  Administered 2019-01-01 – 2019-01-09 (×10): 17 g via ORAL
  Filled 2019-01-01 (×15): qty 1

## 2019-01-01 MED ORDER — MORPHINE SULFATE (PF) 2 MG/ML IV SOLN
2.0000 mg | INTRAVENOUS | Status: DC | PRN
  Administered 2019-01-01 – 2019-01-03 (×3): 2 mg via INTRAVENOUS
  Filled 2019-01-01 (×3): qty 1

## 2019-01-01 MED ORDER — SENNA 8.6 MG PO TABS
2.0000 | ORAL_TABLET | Freq: Every day | ORAL | Status: DC
  Administered 2019-01-01 – 2019-01-08 (×7): 17.2 mg via ORAL
  Filled 2019-01-01 (×9): qty 2

## 2019-01-01 MED ORDER — OXYCODONE HCL 5 MG PO TABS
10.0000 mg | ORAL_TABLET | Freq: Four times a day (QID) | ORAL | Status: DC | PRN
  Administered 2019-01-01 – 2019-01-02 (×2): 10 mg via ORAL
  Filled 2019-01-01 (×2): qty 2

## 2019-01-01 MED ORDER — VALPROIC ACID 250 MG/5ML PO SOLN
250.0000 mg | Freq: Three times a day (TID) | ORAL | Status: DC
  Administered 2019-01-01 – 2019-01-16 (×45): 250 mg via ORAL
  Filled 2019-01-01 (×47): qty 5

## 2019-01-01 NOTE — Progress Notes (Addendum)
PROGRESS NOTE        PATIENT DETAILS Name: Amber [name] Age: 63 y.o. Sex: female Date of Birth: 1956-05-06 Admit Date: 12/29/2018 Admitting Physician Alyson Reedy, MD PCP:Sun, Charyl Dancer, MD  Brief Narrative: Patient is a 63 y.o. female with history of chronic diastolic heart failure, PAF on anticoagulation, embolic CVA,  seizure disorder, rheumatoid arthritis, prior back back surgeries, chronic pain syndrome-presented to the ED for hypotension, was found to have septic shock secondary to gram-negative bacteremia.  See below for further details  Subjective: Complains of abdominal pain-mumbles-very poor historian.  Assessment/Plan: Septic shock secondary to E. coli bacteremia: Sepsis pathophysiology resolving-continue IV antibiotics, await further culture results.  Abdominal pain: Belly soft-is with some mild tenderness in the lower abdominal area but no obvious peritoneal signs-suspect some of this abdominal pain is chronic, per prior notes patient has a history of chronic pancreatitis.  Suspect she also may have constipation contributing to pain.  Obtain Plain abdominal x-ray-she may need suppository/enema.  Lactic acidosis: Secondary to sepsis, use of metformin-has normalized.  Elevated troponin: Trend is flat-not consistent with ACS-likely demand ischemia.  PAF: Sinus rhythm-has prior history of embolic stroke-continue Eliquis.  Chronic thrombocytopenia: Unclear etiology-platelets close to usual baseline.  Follow  Anemia: Suspect has chronic anemia at baseline-worsened by acute illness, has required 1 unit of PRBC transfusion during this hospital stay.  Hemoglobin low but stable-follow.  History of rheumatoid arthritis with chronic steroid use: Appears to be on chronic prednisone-currently on stress dose IV Solu-Cortef-we will slowly taper over the next few days.  Chronic pancreatitis: Continue Creon  History of seizures: Continue Depakote  History of  CVA: Reviewed records in care everywhere-she has had prior embolic strokes-difficult neurologic exam due to lethargy/weakness-but appears nonfocal-on Eliquis  Generalized deconditioning/worsening debility: Per prior notes-minimally ambulatory at baseline-suspect worsening debility due to septic shock with gram-negative bacteremia.  Await PT evaluation.  Per nursing staff-patient noted to cough after she eats-ordered SLP evaluation.  Keep n.p.o.  Chronic pain syndrome: Continue prn oxycodone-await x-ray abdomen-may need to be started on a bowel regimen.    DVT Prophylaxis: Full dose anticoagulation with Eliquis  Code Status: Full code  Family Communication: None at bedside  Disposition Plan: Remain inpatient  Antimicrobial agents: Anti-infectives (From admission, onward)   Start     Dose/Rate Route Frequency Ordered Stop   12/31/18 1330  vancomycin (VANCOCIN) IVPB 750 mg/150 ml premix  Status:  Discontinued     750 mg 150 mL/hr over 60 Minutes Intravenous Every 48 hours 12/29/18 1738 12/30/18 1127   12/30/18 1200  cefTRIAXone (ROCEPHIN) 2 g in sodium chloride 0.9 % 100 mL IVPB     2 g 200 mL/hr over 30 Minutes Intravenous Every 24 hours 12/30/18 1127     12/30/18 0130  ceFEPIme (MAXIPIME) 1 g in sodium chloride 0.9 % 100 mL IVPB  Status:  Discontinued     1 g 200 mL/hr over 30 Minutes Intravenous Every 12 hours 12/29/18 1750 12/30/18 1127   12/29/18 1715  ceFEPIme (MAXIPIME) 2 g in sodium chloride 0.9 % 100 mL IVPB  Status:  Discontinued     2 g 200 mL/hr over 30 Minutes Intravenous  Once 12/29/18 1706 12/29/18 1750   12/29/18 1715  metroNIDAZOLE (FLAGYL) IVPB 500 mg  Status:  Discontinued     500 mg 100 mL/hr over 60 Minutes Intravenous Every  8 hours 12/29/18 1706 12/30/18 1127   12/29/18 1715  vancomycin (VANCOCIN) IVPB 1000 mg/200 mL premix  Status:  Discontinued     1,000 mg 200 mL/hr over 60 Minutes Intravenous  Once 12/29/18 1706 12/29/18 1750   12/29/18 1230  ceFEPIme  (MAXIPIME) 2 g in sodium chloride 0.9 % 100 mL IVPB     2 g 200 mL/hr over 30 Minutes Intravenous  Once 12/29/18 1226 12/29/18 1424   12/29/18 1230  metroNIDAZOLE (FLAGYL) IVPB 500 mg  Status:  Discontinued     500 mg 100 mL/hr over 60 Minutes Intravenous Every 8 hours 12/29/18 1226 12/29/18 1706   12/29/18 1230  vancomycin (VANCOCIN) IVPB 1000 mg/200 mL premix     1,000 mg 200 mL/hr over 60 Minutes Intravenous  Once 12/29/18 1226 12/29/18 1551      Procedures: None  CONSULTS:  None  Time spent: 25- minutes-Greater than 50% of this time was spent in counseling, explanation of diagnosis, planning of further management, and coordination of care.  MEDICATIONS: Scheduled Meds: . allopurinol  300 mg Oral Daily  . apixaban  2.5 mg Oral BID  . aspirin  81 mg Oral Daily  . feeding supplement  1 Container Oral TID BM  . hydrocortisone sod succinate (SOLU-CORTEF) inj  50 mg Intravenous Q12H  . lipase/protease/amylase  24,000 Units Oral TID AC  . mouth rinse  15 mL Mouth Rinse BID  . pantoprazole  40 mg Oral Daily  . valproic acid  250 mg Oral TID   Continuous Infusions: . cefTRIAXone (ROCEPHIN)  IV Stopped (12/31/18 1308)  . dextrose 5 % and 0.9% NaCl 100 mL/hr at 12/31/18 1805   PRN Meds:.acetaminophen, hydrOXYzine, oxyCODONE, simethicone   PHYSICAL EXAM: Vital signs: Vitals:   12/31/18 1625 12/31/18 2020 01/01/19 0611 01/01/19 0810  BP: (!) 157/109 (!) 153/111 (!) 162/117 (!) 146/105  Pulse:  (!) 102 (!) 114   Resp:  20 (!) 34   Temp: (!) 97.5 F (36.4 C) (!) 87.9 F (31.1 C)  97.6 F (36.4 C)  TempSrc:  Oral  Oral  SpO2:  91% 93%   Weight:      Height:       Filed Weights   12/29/18 1229 12/29/18 2040  Weight: 40.5 kg 50.1 kg   Body mass index is 21.57 kg/m.   General appearance :Awake, alert-appears to be in some  of discomfort due to abdominal pain-but otherwise lying comfortably in bed.  He is very chronically sick appearing. HEENT: Atraumatic and  Normocephalic Neck: supple Resp:Good air entry bilaterally, no added sounds  CVS: S1 S2 regular GI: Bowel sounds present, some mild tenderness in the right pelvic area but no peritoneal signs. Extremities: B/L Lower Ext shows no edema, both legs are warm to touch Neurology: Appears to be nonfocal but exam limited due to severe generalized weakness. Musculoskeletal:No digital cyanosis Skin:No Rash, warm and dry Wounds:N/A  I have personally reviewed following labs and imaging studies  LABORATORY DATA: CBC: Recent Labs  Lab 12/29/18 1223 12/30/18 1155 12/30/18 2204 12/31/18 0405  WBC 22.4* 23.1* 23.1* 21.6*  NEUTROABS 15.0*  --  19.9*  --   HGB 7.2* 6.4* 8.5* 8.1*  HCT 22.6* 19.3* 26.0* 24.3*  MCV 83.1 80.1 81.3 80.7  PLT PLATELET CLUMPS NOTED ON SMEAR, UNABLE TO ESTIMATE 84* 72* 72*    Basic Metabolic Panel: Recent Labs  Lab 12/29/18 1223 12/29/18 2230 12/30/18 1155 12/31/18 0405  NA 140 136 144 141  K 2.7* 3.5 4.1  4.7  CL 119* 114* 123* 120*  CO2 15* 12* 12* 14*  GLUCOSE 66* 96 70 79  BUN CREATININE 1.39* 1.18* 1.10* 1.02*  CALCIUM 7.5* 7.1* 7.6* 8.0*  MG 2.3  --   --  1.9  PHOS 3.3  --   --  3.9    GFR: Estimated Creatinine Clearance: 42.1 mL/min (A) (by C-G formula based on SCr of 1.02 mg/dL (H)).  Liver Function Tests: Recent Labs  Lab 12/29/18 1223 12/31/18 0405  AST 66* 40  ALT 24 21  ALKPHOS 212* 154*  BILITOT 1.9* 0.6  PROT 4.5* 4.6*  ALBUMIN 1.7* 1.7*   Recent Labs  Lab 12/29/18 1223 12/30/18 1155 12/30/18 2204  LIPASE AMYLASE  --   --  20*   No results for input(s): AMMONIA in the last 168 hours.  Coagulation Profile: Recent Labs  Lab 12/29/18 1223  INR 1.98    Cardiac Enzymes: Recent Labs  Lab 12/31/18 0405 12/31/18 1043 12/31/18 1632  TROPONINI 0.22* 0.23* 0.20*    BNP (last 3 results) No results for input(s): PROBNP in the last 8760 hours.  HbA1C: No results for input(s): HGBA1C in the last  72 hours.  CBG: Recent Labs  Lab 12/31/18 1159 12/31/18 1536 01/01/19 0015 01/01/19 0349 01/01/19 0809  GLUCAP 111* 85 144* 146* 126*    Lipid Profile: No results for input(s): CHOL, HDL, LDLCALC, TRIG, CHOLHDL, LDLDIRECT in the last 72 hours.  Thyroid Function Tests: Recent Labs    12/29/18 1228  TSH 1.744    Anemia Panel: No results for input(s): VITAMINB12, FOLATE, FERRITIN, TIBC, IRON, RETICCTPCT in the last 72 hours.  Urine analysis:    Component Value Date/Time   COLORURINE YELLOW 12/29/2018 1535   APPEARANCEUR HAZY (A) 12/29/2018 1535   LABSPEC 1.010 12/29/2018 1535   PHURINE 7.0 12/29/2018 1535   GLUCOSEU NEGATIVE 12/29/2018 1535   HGBUR TRACE (A) 12/29/2018 1535   BILIRUBINUR NEGATIVE 12/29/2018 1535   KETONESUR NEGATIVE 12/29/2018 1535   PROTEINUR TRACE (A) 12/29/2018 1535   UROBILINOGEN 1.0 07/27/2015 1052   NITRITE NEGATIVE 12/29/2018 1535   LEUKOCYTESUR LARGE (A) 12/29/2018 1535    Sepsis Labs: Lactic Acid, Venous    Component Value Date/Time   LATICACIDVEN 1.6 01/01/2019 0953    MICROBIOLOGY: Recent Results (from the past 240 hour(s))  Blood Culture (routine x 2)     Status: None (Preliminary result)   Collection Time: 12/29/18 12:23 PM  Result Value Ref Range Status   Specimen Description BLOOD RIGHT FOREARM  Final   Special Requests   Final    BOTTLES DRAWN AEROBIC AND ANAEROBIC Blood Culture adequate volume Performed at [facility], [address], [place], [place] [address]    Culture   Final    NO GROWTH 3 DAYS Performed at [facility], [address] [place], [place] [address]    Report Status PENDING  Incomplete  Urine culture     Status: Abnormal   Collection Time: 12/29/18 12:24 PM  Result Value Ref Range Status   Specimen Description   Final    URINE, RANDOM Performed at [facility], [address] [place], [place] [address]    Special Requests   Final    NONE Performed  at [facility], [address] [place], [place] [address]    Culture MULTIPLE SPECIES PRESENT, SUGGEST RECOLLECTION (A)  Final   Report Status 12/31/2018 FINAL  Final  Blood Culture (routine x 2)     Status: Abnormal   Collection Time: 12/29/18 12:28 PM  Result Value Ref Range Status   Specimen Description BLOOD RIGHT ARM  Final   Special Requests   Final    BOTTLES DRAWN AEROBIC AND ANAEROBIC Blood Culture adequate volume Performed at [facility], [address] [place], [place] [address]    Culture  Setup Time   Final    GRAM NEGATIVE RODS ANAEROBIC BOTTLE ONLY Organism ID to follow CRITICAL RESULT CALLED TO, READ BACK BY AND VERIFIED WITH: Gay FillerM. Pham PharmD 9:45 12/30/18 ([email or web address]) Performed at [facility], [address] [place], [place] [address]    Culture ESCHERICHIA COLI (A)  Final   Report Status 01/01/2019 FINAL  Final   Organism ID, Bacteria ESCHERICHIA COLI  Final      Susceptibility   Escherichia coli - MIC*    AMPICILLIN >=32 RESISTANT Resistant     CEFAZOLIN 32 INTERMEDIATE Intermediate     CEFEPIME <=1 SENSITIVE Sensitive     CEFTAZIDIME <=1 SENSITIVE Sensitive     CEFTRIAXONE <=1 SENSITIVE Sensitive     CIPROFLOXACIN >=4 RESISTANT Resistant     GENTAMICIN <=1 SENSITIVE Sensitive     IMIPENEM <=0.25 SENSITIVE Sensitive     TRIMETH/SULFA <=20 SENSITIVE Sensitive     AMPICILLIN/SULBACTAM >=32 RESISTANT Resistant     PIP/TAZO >=128 RESISTANT Resistant     Extended ESBL NEGATIVE Sensitive     * ESCHERICHIA COLI  Blood Culture ID Panel (Reflexed)     Status: Abnormal   Collection Time: 12/29/18 12:28 PM  Result Value Ref Range Status   Enterococcus species NOT DETECTED NOT DETECTED Final   Listeria monocytogenes NOT DETECTED NOT DETECTED Final   Staphylococcus species NOT DETECTED NOT DETECTED Final   Staphylococcus aureus (BCID) NOT DETECTED NOT DETECTED Final   Streptococcus species NOT DETECTED NOT DETECTED Final     Streptococcus agalactiae NOT DETECTED NOT DETECTED Final   Streptococcus pneumoniae NOT DETECTED NOT DETECTED Final   Streptococcus pyogenes NOT DETECTED NOT DETECTED Final   Acinetobacter baumannii NOT DETECTED NOT DETECTED Final   Enterobacteriaceae species DETECTED (A) NOT DETECTED Final    Comment: Enterobacteriaceae represent a large family of gram-negative bacteria, not a single organism. CRITICAL RESULT CALLED TO, READ BACK BY AND VERIFIED WITH: Gay FillerM. Pham PharmD 9:45 12/30/18 ([email or web address])    Enterobacter cloacae complex NOT DETECTED NOT DETECTED Final   Escherichia coli DETECTED (A) NOT DETECTED Final    Comment: CRITICAL RESULT CALLED TO, READ BACK BY AND VERIFIED WITH: Gay FillerM. Pham PharmD 9:45 12/30/18 ([email or web address])    Klebsiella oxytoca NOT DETECTED NOT DETECTED Final   Klebsiella pneumoniae NOT DETECTED NOT DETECTED Final   Proteus species NOT DETECTED NOT DETECTED Final   Serratia marcescens NOT DETECTED NOT DETECTED Final   Carbapenem resistance NOT DETECTED NOT DETECTED Final   Haemophilus influenzae NOT DETECTED NOT DETECTED Final   Neisseria meningitidis NOT DETECTED NOT DETECTED Final   Pseudomonas aeruginosa NOT DETECTED NOT DETECTED Final   Candida albicans NOT DETECTED NOT DETECTED Final   Candida glabrata NOT DETECTED NOT DETECTED Final   Candida krusei NOT DETECTED NOT DETECTED Final   Candida parapsilosis NOT DETECTED NOT DETECTED Final   Candida tropicalis NOT DETECTED NOT DETECTED Final    Comment: Performed at [facility], [address] [place], [place] [address]  MRSA PCR Screening     Status: None   Collection Time: 12/29/18  8:36 PM  Result Value Ref Range Status   MRSA by PCR NEGATIVE NEGATIVE Final    Comment:        The GeneXpert MRSA Assay (FDA approved for NASAL specimens only), is one component of a comprehensive MRSA colonization surveillance program. It is not intended to diagnose MRSA infection nor to guide or monitor treatment for MRSA  infections. Performed at [facility], [address] [place], [place] [address]   Gastrointestinal Panel by PCR , Stool     Status: None   Collection Time: 12/30/18 10:17 PM  Result Value Ref Range Status   Campylobacter species NOT DETECTED NOT DETECTED Final   Plesimonas shigelloides NOT DETECTED NOT DETECTED Final   Salmonella species NOT DETECTED NOT DETECTED Final   Yersinia enterocolitica NOT DETECTED NOT DETECTED Final   Vibrio species NOT DETECTED NOT DETECTED Final   Vibrio cholerae NOT DETECTED NOT DETECTED Final   Enteroaggregative E coli (EAEC) NOT DETECTED NOT DETECTED Final   Enteropathogenic E coli (EPEC) NOT DETECTED NOT DETECTED Final   Enterotoxigenic E coli (ETEC) NOT DETECTED NOT DETECTED Final   Shiga like toxin producing E coli (STEC) NOT DETECTED NOT DETECTED Final   Shigella/Enteroinvasive E coli (EIEC) NOT DETECTED NOT DETECTED Final   Cryptosporidium NOT DETECTED NOT DETECTED Final   Cyclospora cayetanensis NOT DETECTED NOT DETECTED Final   Entamoeba histolytica NOT DETECTED NOT DETECTED Final   Giardia lamblia NOT DETECTED NOT DETECTED Final   Adenovirus F40/41 NOT DETECTED NOT DETECTED Final   Astrovirus NOT DETECTED NOT DETECTED Final   Norovirus GI/GII NOT DETECTED NOT DETECTED Final   Rotavirus A NOT DETECTED NOT DETECTED Final   Sapovirus (I, II, IV, and V) NOT DETECTED NOT DETECTED Final    Comment: Performed at [facility], [address] [place], [place] [address]    RADIOLOGY STUDIES/RESULTS: Ct Abdomen Pelvis Wo Contrast  Result Date: 12/29/2018 CLINICAL DATA:  Abdomen distension two days ago here for food poisoning Allergic to iv contrast no oral contrast per MD EXAM: CT ABDOMEN AND PELVIS WITHOUT CONTRAST TECHNIQUE: Multidetector CT imaging of the abdomen and pelvis was performed following the standard protocol without IV contrast. COMPARISON:  12/27/2018 FINDINGS: Lower chest: There is bibasilar atelectasis. Hepatobiliary: The  liver is homogeneous. Status post cholecystectomy. Pancreas: Pancreas is atrophic. Coarse calcifications throughout the pancreatic body and tail suggests history of chronic pancreatitis. Spleen: Normal in size without focal abnormality. Adrenals/Urinary Tract: Adrenal glands are unremarkable. Kidneys are normal, without renal calculi, focal lesion, or hydronephrosis. Bladder is unremarkable. Stomach/Bowel: Stomach is unremarkable. Small bowel loops are normal in appearance. There are numerous colonic diverticula. No acute diverticulitis. Significant stool within the rectosigmoid colon. There has been some improvement in the appearance of thickening of the rectosigmoid colon. No perirectal abscess. Vascular/Lymphatic: There is dense atherosclerotic calcification of the abdominal aorta not associated with aneurysm. No retroperitoneal or mesenteric adenopathy. Reproductive: The uterus is present. Multiple mural calcifications are present. No adnexal mass. Other: No ascites. No abdominal wall hernia. Note is made of fat within the LEFT inguinal ring, likely a lipoma. Diffuse body wall edema. Musculoskeletal: Remote wedge compression fractures of T12, L1, and L3. There is 2 millimeters anterolisthesis of L4 on L5. Degenerative changes are identified at L4-5 and L5-S1. No suspicious lytic or blastic lesions are identified. IMPRESSION: 1. Significant stool within the rectosigmoid colon. There has been some improvement in the appearance of thickening of the rectosigmoid colon. 2. Diverticulosis without acute diverticulitis. 3. Status  post cholecystectomy. 4. Changes of chronic pancreatitis. 5. Remote wedge compression fractures of T12, L1, and L3. Aortic Atherosclerosis (ICD10-I70.0). Electronically Signed   By: Norva PavlovElizabeth  Brown M.D.   On: 12/29/2018 13:12   Dg Chest Port 1 View  Result Date: 12/29/2018 CLINICAL DATA:  63 year old female with altered mental status, fever, decreased p.o. intake. EXAM: PORTABLE CHEST 1  VIEW COMPARISON:  Portable chest 11/13/2018 and earlier. FINDINGS: Portable AP semi upright view at 1320 hours. Sequelae of previous thoracic spine corpectomy and posterior fusion. Hardware appears stable. Stable visualized osseous structures. Stable lung volumes and mediastinal contours. Visualized tracheal air column is within normal limits. Chronic increased pulmonary interstitial markings appear stable since 2019. No pneumothorax, pleural effusion or acute pulmonary opacity. Mildly increased bowel gas in the abdomen. Stable cholecystectomy clips. IMPRESSION: Stable.  No acute cardiopulmonary abnormality. Electronically Signed   By: Odessa FlemingH  Hall M.D.   On: 12/29/2018 14:03   Dg Abd Portable 1v  Result Date: 12/30/2018 CLINICAL DATA:  Abdominal pain EXAM: PORTABLE ABDOMEN - 1 VIEW COMPARISON:  CT 12/29/2018 FINDINGS: Surgical hardware in the lower thoracic spine. Dense aortic atherosclerosis. Mild diffuse increased bowel gas without obstructive pattern. Possible bowel wall thickening at the hepatic flexure and descending colon. No radiopaque calculi. IMPRESSION: Diffuse increased bowel gas without obstructive pattern. There may be bowel wall edema at the hepatic flexure and descending colon as may be seen with colitis Electronically Signed   By: Jasmine PangKim  Fujinaga M.D.   On: 12/30/2018 21:45     LOS: 3 days   Jeoffrey MassedShanker Therasa Lorenzi, MD  [facility]  If 7PM-7AM, please contact night-coverage  Please page via [email or web address]  Go to [email or web address] and use [place]'s universal password to access. If you do not have the password, please contact the hospital operator.  Locate the [facility] provider you are looking for under [facility] and page to a number that you can be directly reached. If you still have difficulty reaching the provider, please page the [facility] (Director on Call) for the Hospitalists listed on amion for assistance.  01/01/2019, 10:44 AM

## 2019-01-01 NOTE — Evaluation (Signed)
Clinical/Bedside Swallow Evaluation Patient Details  Name: Geralyn FlashBessie Kaser MRN: [id number] Date of Birth: 1955/12/18  Today's Date: 01/01/2019 Time: SLP Start Time (ACUTE ONLY): 1424 SLP Stop Time (ACUTE ONLY): 1449 SLP Time Calculation (min) (ACUTE ONLY): 25 min  Past Medical History:  Past Medical History:  Diagnosis Date  . ACS (acute coronary syndrome) (HCC) 06/12/2015  . Acute on chronic blood loss anemia 11/25/2017  . Arthritis   . Asthma   . 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 Chronic pain   . Constipation   . Diabetes mellitus without complication (HCC)   . Duodenitis   . Embolic stroke (HCC)   . Falls frequently   . Focal seizure (HCC)   . GERD (gastroesophageal reflux disease)   . Hypertension   . Impacted fracture 02/15/2017   right glenoid  . Pneumonia 10/2015  . Seizures (HCC)    last seizure March 2015  . Small bowel obstruction (HCC) 11/25/2017   Past Surgical History:  Past Surgical History:  Procedure Laterality Date  . APPENDECTOMY    . BACK SURGERY    . CHOLECYSTECTOMY    . TEE WITHOUT CARDIOVERSION N/A 10/30/2016   Procedure: TRANSESOPHAGEAL ECHOCARDIOGRAM (TEE);  Surgeon: Lars MassonKatarina H Nelson, MD;  [facility];  Service: Cardiovascular;  Laterality: N/A;   HPI:  Pt is a 63 y.o. female admitted 12/29/18 for hypotension with recent admission to [facility] for food poisoning and electrolyte abnormalities (d/c home on 12/28/18).  Pt came to ED not feeling well and found to be hyptensive.  Pt dx with septic shock due to E coli gastroenteritis with bacteremia, lactic acidosis, hypokalemia, elevated troponin (suspected demand ischemia), mild AKI, and multiple episodes of hypoglycemia (h/o DM and poor po intake).  Pt with other significant PMH of HTN, embolic stroke, focal seizure, ACS, frequent falls, back surgery, PNA, and GERD. Pt has had several BSEs in the past (2018, x2 in 2019), all of which recommended thin  liquids. MBS was performed in October 2019 at [facility] with report suggesting an oral dysphagia although with flash penetration with thin liquids. Dys 2 diet and thin liquids were recommended.    Assessment / Plan / Recommendation Clinical Impression  Pt had immediate coughing when taking large, impuslive boluses via straw, otherwise not exhibiting overt difficulty with SLP controlling rate of intake. Although silent aspiration cannot be ruled out at bedside, pt has had previous swallow testing (including MBS) that showed good airway protection. CXR without acute changes. Her risk for aspiration is higher at the moment in the setting of deconditioning and AMS. Therefore, recommend restarting clear liquid diet but only with full supervision to facilitate careful feeding and use of aspiration precautions. Daughter was present, educated, and in agreement with plan. Will f/u for tolerance versus need for any additional testing.  SLP Visit Diagnosis: Dysphagia, unspecified (R13.10)    Aspiration Risk  Mild aspiration risk;Moderate aspiration risk    Diet Recommendation Thin liquid;Other (Comment)(clear liquid diet)   Liquid Administration via: Straw Medication Administration: Crushed with puree Supervision: Staff to assist with self feeding;Full supervision/cueing for compensatory strategies Compensations: Slow rate;Small sips/bites Postural Changes: Seated upright at 90 degrees    Other  Recommendations Oral Care Recommendations: Oral care BID   Follow up Recommendations (tba)      Frequency and Duration min 2x/week  2 weeks       Prognosis Prognosis for Safe Diet Advancement: Good Barriers to Reach Goals: Cognitive deficits  Swallow Study   General HPI: Pt is a 63 y.o. female admitted 12/29/18 for hypotension with recent admission to [facility] for food poisoning and electrolyte abnormalities (d/c home on 12/28/18).  Pt came to ED not feeling well and found to be  hyptensive.  Pt dx with septic shock due to E coli gastroenteritis with bacteremia, lactic acidosis, hypokalemia, elevated troponin (suspected demand ischemia), mild AKI, and multiple episodes of hypoglycemia (h/o DM and poor po intake).  Pt with other significant PMH of HTN, embolic stroke, focal seizure, ACS, frequent falls, back surgery, PNA, and GERD. Pt has had several BSEs in the past (2018, x2 in 2019), all of which recommended thin liquids. MBS was performed in October 2019 at [facility] with report suggesting an oral dysphagia although with flash penetration with thin liquids. Dys 2 diet and thin liquids were recommended.  Type of Study: Bedside Swallow Evaluation Previous Swallow Assessment: see HPI Diet Prior to this Study: NPO;Other (Comment)(CLD prior to that) Temperature Spikes Noted: No Respiratory Status: Nasal cannula History of Recent Intubation: No Behavior/Cognition: Alert;Cooperative;Requires cueing Oral Cavity Assessment: Within Functional Limits Oral Care Completed by SLP: No Oral Cavity - Dentition: Adequate natural dentition Self-Feeding Abilities: Total assist Patient Positioning: Upright in bed Baseline Vocal Quality: Normal    Oral/Motor/Sensory Function     Ice Chips Ice chips: Not tested   Thin Liquid Thin Liquid: Impaired Presentation: Straw Pharyngeal  Phase Impairments: Cough - Immediate    Nectar Thick Nectar Thick Liquid: Not tested   Honey Thick Honey Thick Liquid: Not tested   Puree Puree: Not tested   Solid     Solid: Not tested      Virl Axe Reiley Keisler 01/01/2019,3:09 PM  Maxcine Ham Trinidad Petron, M.A. CCC-SLP Acute Herbalist [phone] Office [phone]

## 2019-01-01 NOTE — Clinical Social Work Note (Signed)
Clinical Social Work Assessment  Patient Details  Name: Amber [name] MRN: [id number] Date of Birth: 1956-08-06  Date of referral:  01/01/19               Reason for consult:  Facility Placement                Permission sought to share information with:  Facility Medical sales representative, Family Supports Permission granted to share information::  No  Name::     Building control surveyor::  SNFs  Relationship::  Daughter  Contact Information:  [phone]  Housing/Transportation Living arrangements for the past 2 months:  Apartment Source of Information:  Adult Children Patient Interpreter Needed:  None Criminal Activity/Legal Involvement Pertinent to Current Situation/Hospitalization:  No - Comment as needed Significant Relationships:  Adult Children, Other Family Members Lives with:  Relatives(Granddaughter) Do you feel safe going back to the place where you live?  No Need for family participation in patient care:  Yes (Comment)  Care giving concerns:  CSW received consult for possible SNF placement at time of discharge. CSW spoke with patient's daughter regarding PT recommendation of SNF placement at time of discharge. Patient's daughter reported that patient lives with her granddaughter and her son lives five minutes away. Patient's duaghter expressed understanding of PT recommendation but is not agreeable to SNF placement at time of discharge. She requests that patient return home with home health. She also has Personal care services through [employer] but they are looking for a new company.    Social Worker assessment / plan:  Patient's daughter requests that patient return home at discharge.   Employment status:  Disabled (Comment on whether or not currently receiving Disability) Insurance information:  Medicaid In [place] PT Recommendations:  Skilled Nursing Facility Information / Referral to community resources:  Skilled Nursing Facility  Patient/Family's Response to care:   Patient's daughter does not like nursing facilities, even for short term. She states that patient does not eat much but is able to normally walk around with her walker. She would like for therapy to come and work with her at home. CSW explained that RNCM would try to find a company that accepts Medicaid at this time, though it would not be very frequent. She expresses understanding. She is also working on getting patient into the [employer] program for more services.   Patient/Family's Understanding of and Emotional Response to Diagnosis, Current Treatment, and Prognosis:  Patient/family is realistic regarding therapy needs and expressed being hopeful for return home. Patient's daughter expressed understanding of CSW role and discharge process as well as medical condition. No questions/concerns about plan or treatment.    Emotional Assessment Appearance:  Appears stated age Attitude/Demeanor/Rapport:  Unable to Assess Affect (typically observed):  Unable to Assess Orientation:  Oriented to Self, Oriented to Place Alcohol / Substance use:  Not Applicable Psych involvement (Current and /or in the community):  No (Comment)  Discharge Needs  Concerns to be addressed:  Care Coordination Readmission within the last 30 days:  No Current discharge risk:  Dependent with Mobility Barriers to Discharge:  Continued Medical Work up   [employer], LCSW 01/01/2019, 10:05 AM

## 2019-01-01 NOTE — Progress Notes (Signed)
D/C enteric precautions per Infectious disease.

## 2019-01-02 LAB — CBC
HCT: 25.7 % — ABNORMAL LOW (ref 36.0–46.0)
Hemoglobin: 9.1 g/dL — ABNORMAL LOW (ref 12.0–15.0)
MCH: 27.5 pg (ref 26.0–34.0)
MCHC: 35.4 g/dL (ref 30.0–36.0)
MCV: 77.6 fL — ABNORMAL LOW (ref 80.0–100.0)
Platelets: 62 10*3/uL — ABNORMAL LOW (ref 150–400)
RBC: 3.31 MIL/uL — ABNORMAL LOW (ref 3.87–5.11)
RDW: 16.5 % — ABNORMAL HIGH (ref 11.5–15.5)
WBC: 15.4 10*3/uL — ABNORMAL HIGH (ref 4.0–10.5)
nRBC: 0.1 % (ref 0.0–0.2)

## 2019-01-02 LAB — BASIC METABOLIC PANEL
Anion gap: 10 (ref 5–15)
BUN: 6 mg/dL (ref 6–20)
CO2: 18 mmol/L — ABNORMAL LOW (ref 22–32)
CREATININE: 0.89 mg/dL (ref 0.44–1.00)
Calcium: 7.4 mg/dL — ABNORMAL LOW (ref 8.9–10.3)
Chloride: 118 mmol/L — ABNORMAL HIGH (ref 98–111)
GFR calc Af Amer: >60 mL/min (ref >60)
GFR calc non Af Amer: >60 mL/min (ref >60)
Glucose, Bld: 143 mg/dL — ABNORMAL HIGH (ref 70–99)
Potassium: 2.7 mmol/L — CL (ref 3.5–5.1)
Sodium: 146 mmol/L — ABNORMAL HIGH (ref 135–145)

## 2019-01-02 MED ORDER — PREDNISONE 5 MG PO TABS
5.0000 mg | ORAL_TABLET | Freq: Every day | ORAL | Status: DC
  Administered 2019-01-03 – 2019-01-09 (×7): 5 mg via ORAL
  Filled 2019-01-02 (×7): qty 1

## 2019-01-02 MED ORDER — PANCRELIPASE (LIP-PROT-AMYL) 12000-38000 UNITS PO CPEP
36000.0000 [IU] | ORAL_CAPSULE | Freq: Three times a day (TID) | ORAL | Status: DC
  Administered 2019-01-02 – 2019-01-16 (×42): 36000 [IU] via ORAL
  Filled 2019-01-02 (×41): qty 3

## 2019-01-02 MED ORDER — PANTOPRAZOLE SODIUM 40 MG PO TBEC
40.0000 mg | DELAYED_RELEASE_TABLET | Freq: Two times a day (BID) | ORAL | Status: DC
  Administered 2019-01-02 – 2019-01-16 (×29): 40 mg via ORAL
  Filled 2019-01-02 (×29): qty 1

## 2019-01-02 MED ORDER — HYDROCORTISONE NA SUCCINATE PF 100 MG IJ SOLR
25.0000 mg | Freq: Two times a day (BID) | INTRAMUSCULAR | Status: AC
  Administered 2019-01-02: 25 mg via INTRAVENOUS
  Filled 2019-01-02: qty 2

## 2019-01-02 MED ORDER — MAGNESIUM SULFATE 2 GM/50ML IV SOLN
2.0000 g | Freq: Once | INTRAVENOUS | Status: AC
  Administered 2019-01-02: 2 g via INTRAVENOUS
  Filled 2019-01-02: qty 50

## 2019-01-02 MED ORDER — POTASSIUM CHLORIDE 10 MEQ/100ML IV SOLN
10.0000 meq | INTRAVENOUS | Status: AC
  Administered 2019-01-02 (×4): 10 meq via INTRAVENOUS
  Filled 2019-01-02: qty 100

## 2019-01-02 MED ORDER — OXYCODONE HCL 5 MG PO TABS
20.0000 mg | ORAL_TABLET | Freq: Four times a day (QID) | ORAL | Status: DC | PRN
  Administered 2019-01-02 – 2019-01-04 (×6): 20 mg via ORAL
  Filled 2019-01-02 (×6): qty 4

## 2019-01-02 MED ORDER — WHITE PETROLATUM EX OINT
TOPICAL_OINTMENT | CUTANEOUS | Status: AC
  Administered 2019-01-02: 10:00:00
  Filled 2019-01-02: qty 28.35

## 2019-01-02 MED ORDER — HYDROCORTISONE NA SUCCINATE PF 100 MG IJ SOLR
25.0000 mg | Freq: Two times a day (BID) | INTRAMUSCULAR | Status: DC
  Administered 2019-01-02: 25 mg via INTRAVENOUS
  Filled 2019-01-02: qty 2

## 2019-01-02 MED ORDER — FUROSEMIDE 10 MG/ML IJ SOLN
40.0000 mg | Freq: Once | INTRAMUSCULAR | Status: AC
  Administered 2019-01-02: 40 mg via INTRAVENOUS
  Filled 2019-01-02: qty 4

## 2019-01-02 NOTE — Progress Notes (Signed)
PROGRESS NOTE        PATIENT DETAILS Name: Amber [name] Age: 63 y.o. Sex: female Date of Birth: January 19, 1956 Admit Date: 12/29/2018 Admitting Physician Alyson Reedy, MD PCP:Sun, Charyl Dancer, MD  Brief Narrative: Patient is a 63 y.o. female with history of chronic diastolic heart failure, PAF on anticoagulation, embolic CVA,  seizure disorder, rheumatoid arthritis, prior back back surgeries, chronic pain syndrome-presented to the ED for hypotension, was found to have septic shock secondary to gram-negative bacteremia.  See below for further details  Subjective: Much more awake compared to yesterday-complains of neck pain, back pain and abdominal pain-per patient-these are chronic issues-claims that she takes 20 mg of oxycodone at home (currently on 10 mg).  Asking if she can walk again (has not walked for 4 years)-wants to think about going to rehab.  Assessment/Plan: Septic shock secondary to E. coli bacteremia: Sepsis pathophysiology has resolved, continue IV Rocephin x1 more day-we will transition to oral antimicrobial therapy tomorrow if clinical improvement continues.  Abdominal pain: Much improved-she had numerous BMs overnight after she was started on Dulcolax x1, senna/MiraLAX.  She has chronic abdominal pain at baseline from chronic pancreatitis.    Lactic acidosis: Secondary to sepsis, has normalized.  Elevated troponin: Trend is flat-not consistent with ACS-likely demand ischemia.  EKG without acute changes.  Given poor overall health-bedbound status-best served by conservative management.  PAF: Sinus rhythm-has prior history of embolic stroke-continue Eliquis.  Chronic thrombocytopenia: Unclear etiology-platelets close to usual baseline.  Follow  Anemia: Suspect has chronic anemia at baseline-worsened by acute illness, has required 1 unit of PRBC transfusion during this hospital stay.  Hemoglobin low but stable-follow.  History of rheumatoid arthritis with  chronic steroid use: Appears to be on chronic prednisone-currently on stress dose IV Solu-Cortef-we will slowly taper over the next few days.  Chronic pancreatitis: Continue Creon  History of seizures: Continue Depakote  History of CVA: Reviewed records in care everywhere-she has had prior embolic strokes-difficult neurologic exam due to lethargy/weakness-but appears nonfocal-on Eliquis  Generalized deconditioning/worsening debility: Per patient-she has not ambulated in 4 years-mostly bed to wheelchair bound.  She is going to talk to her family about SNF rehab-as per social work-her family wants to take her home.    Chronic pain syndrome (back pain, neck pain, abdominal pain): Continue prn oxycodone-await x-ray abdomen-may need to be started on a bowel regimen.    DVT Prophylaxis: Full dose anticoagulation with Eliquis  Code Status: Full code  Family Communication: None at bedside  Disposition Plan: Remain inpatient-hopefully discharge tomorrow if clinical improvement continues.  Antimicrobial agents: Anti-infectives (From admission, onward)   Start     Dose/Rate Route Frequency Ordered Stop   12/31/18 1330  vancomycin (VANCOCIN) IVPB 750 mg/150 ml premix  Status:  Discontinued     750 mg 150 mL/hr over 60 Minutes Intravenous Every 48 hours 12/29/18 1738 12/30/18 1127   12/30/18 1200  cefTRIAXone (ROCEPHIN) 2 g in sodium chloride 0.9 % 100 mL IVPB     2 g 200 mL/hr over 30 Minutes Intravenous Every 24 hours 12/30/18 1127     12/30/18 0130  ceFEPIme (MAXIPIME) 1 g in sodium chloride 0.9 % 100 mL IVPB  Status:  Discontinued     1 g 200 mL/hr over 30 Minutes Intravenous Every 12 hours 12/29/18 1750 12/30/18 1127   12/29/18 1715  ceFEPIme (MAXIPIME) 2  g in sodium chloride 0.9 % 100 mL IVPB  Status:  Discontinued     2 g 200 mL/hr over 30 Minutes Intravenous  Once 12/29/18 1706 12/29/18 1750   12/29/18 1715  metroNIDAZOLE (FLAGYL) IVPB 500 mg  Status:  Discontinued     500 mg 100  mL/hr over 60 Minutes Intravenous Every 8 hours 12/29/18 1706 12/30/18 1127   12/29/18 1715  vancomycin (VANCOCIN) IVPB 1000 mg/200 mL premix  Status:  Discontinued     1,000 mg 200 mL/hr over 60 Minutes Intravenous  Once 12/29/18 1706 12/29/18 1750   12/29/18 1230  ceFEPIme (MAXIPIME) 2 g in sodium chloride 0.9 % 100 mL IVPB     2 g 200 mL/hr over 30 Minutes Intravenous  Once 12/29/18 1226 12/29/18 1424   12/29/18 1230  metroNIDAZOLE (FLAGYL) IVPB 500 mg  Status:  Discontinued     500 mg 100 mL/hr over 60 Minutes Intravenous Every 8 hours 12/29/18 1226 12/29/18 1706   12/29/18 1230  vancomycin (VANCOCIN) IVPB 1000 mg/200 mL premix     1,000 mg 200 mL/hr over 60 Minutes Intravenous  Once 12/29/18 1226 12/29/18 1551      Procedures: None  CONSULTS:  None  Time spent: 25- minutes-Greater than 50% of this time was spent in counseling, explanation of diagnosis, planning of further management, and coordination of care.  MEDICATIONS: Scheduled Meds: . white petrolatum      . allopurinol  300 mg Oral Daily  . apixaban  2.5 mg Oral BID  . feeding supplement  1 Container Oral TID BM  . hydrocortisone sod succinate (SOLU-CORTEF) inj  25 mg Intravenous Q12H  . lipase/protease/amylase  36,000 Units Oral TID AC  . mouth rinse  15 mL Mouth Rinse BID  . pantoprazole  40 mg Oral BID  . polyethylene glycol  17 g Oral BID  . senna  2 tablet Oral QHS  . valproic acid  250 mg Oral TID   Continuous Infusions: . cefTRIAXone (ROCEPHIN)  IV 2 g (01/01/19 1234)  . dextrose 5 % and 0.9% NaCl 100 mL/hr at 01/02/19 0459  . magnesium sulfate 1 - 4 g bolus IVPB    . potassium chloride 10 mEq (01/02/19 0950)   PRN Meds:.acetaminophen, hydrOXYzine, morphine injection, oxyCODONE, simethicone   PHYSICAL EXAM: Vital signs: Vitals:   01/01/19 1259 01/01/19 1525 01/01/19 2129 01/02/19 0537  BP:  (!) 149/100 (!) 156/106 (!) 152/98  Pulse:  (!) 106 (!) 103 (!) 107  Resp:  (!) 32 18 18  Temp: 97.6 F  (36.4 C) 97.6 F (36.4 C) 98.4 F (36.9 C) 98.2 F (36.8 C)  TempSrc: Oral Oral Oral Oral  SpO2:  100% 98% 98%  Weight:      Height:       Filed Weights   12/29/18 1229 12/29/18 2040  Weight: 40.5 kg 50.1 kg   Body mass index is 21.57 kg/m.   General appearance:Awake, alert, not in any distress.  Chronically sick appearing. Eyes:no scleral icterus. HEENT: Atraumatic and Normocephalic Neck: supple, no JVD. Resp:Good air entry bilaterally,no rales or rhonchi CVS: S1 S2 regular, no murmurs.  GI: Bowel sounds present, mildly tender in the mid abdominal area but soft and without any peritoneal signs n Extremities: B/L Lower Ext shows + edema, both legs are warm to touch Neurology:  Non focal Psychiatric: Normal judgment and insight. Normal mood. Musculoskeletal:No digital cyanosis Skin:No Rash, warm and dry Wounds:N/A  I have personally reviewed following labs and imaging studies  LABORATORY DATA: CBC: Recent Labs  Lab 12/29/18 1223 12/30/18 1155 12/30/18 2204 12/31/18 0405 01/01/19 0953 01/02/19 0700  WBC 22.4* 23.1* 23.1* 21.6* 19.8* 15.4*  NEUTROABS 15.0*  --  19.9*  --   --   --   HGB 7.2* 6.4* 8.5* 8.1* 9.5* 9.1*  HCT 22.6* 19.3* 26.0* 24.3* 28.5* 25.7*  MCV 83.1 80.1 81.3 80.7 79.4* 77.6*  PLT PLATELET CLUMPS NOTED ON SMEAR, UNABLE TO ESTIMATE 84* 72* 72* 69* 62*    Basic Metabolic Panel: Recent Labs  Lab 12/29/18 1223 12/29/18 2230 12/30/18 1155 12/31/18 0405 01/01/19 0953 01/02/19 0700  NA 140 136 144 141 144 146*  K 2.7* 3.5 4.1 4.7 4.1 2.7*  CL 119* 114* 123* 120* 119* 118*  CO2 15* 12* 12* 14* 17* 18*  GLUCOSE 66* 96 70 79 136* 143*  BUN CREATININE 1.39* 1.18* 1.10* 1.02* 0.95 0.89  CALCIUM 7.5* 7.1* 7.6* 8.0* 7.9* 7.4*  MG 2.3  --   --  1.9 1.9  --   PHOS 3.3  --   --  3.9 3.2  --     GFR: Estimated Creatinine Clearance: 48.3 mL/min (by C-G formula based on SCr of 0.89 mg/dL).  Liver Function Tests: Recent Labs  Lab  12/29/18 1223 12/31/18 0405 01/01/19 0953  AST 66* 40 41  ALT ALKPHOS 212* 154* 171*  BILITOT 1.9* 0.6 0.6  PROT 4.5* 4.6* 5.0*  ALBUMIN 1.7* 1.7* 1.8*   Recent Labs  Lab 12/29/18 1223 12/30/18 1155 12/30/18 2204  LIPASE AMYLASE  --   --  20*   Recent Labs  Lab 01/01/19 0953  AMMONIA 53*    Coagulation Profile: Recent Labs  Lab 12/29/18 1223  INR 1.98    Cardiac Enzymes: Recent Labs  Lab 12/31/18 0405 12/31/18 1043 12/31/18 1632 01/01/19 0953  TROPONINI 0.22* 0.23* 0.20* 0.34*    BNP (last 3 results) No results for input(s): PROBNP in the last 8760 hours.  HbA1C: No results for input(s): HGBA1C in the last 72 hours.  CBG: Recent Labs  Lab 12/31/18 1536 01/01/19 0015 01/01/19 0349 01/01/19 0809 01/01/19 1256  GLUCAP 85 144* 146* 126* 159*    Lipid Profile: No results for input(s): CHOL, HDL, LDLCALC, TRIG, CHOLHDL, LDLDIRECT in the last 72 hours.  Thyroid Function Tests: No results for input(s): TSH, T4TOTAL, FREET4, T3FREE, THYROIDAB in the last 72 hours.  Anemia Panel: Recent Labs    01/01/19 0953  VITAMINB12 898  FOLATE 13.1    Urine analysis:    Component Value Date/Time   COLORURINE YELLOW 12/29/2018 1535   APPEARANCEUR HAZY (A) 12/29/2018 1535   LABSPEC 1.010 12/29/2018 1535   PHURINE 7.0 12/29/2018 1535   GLUCOSEU NEGATIVE 12/29/2018 1535   HGBUR TRACE (A) 12/29/2018 1535   BILIRUBINUR NEGATIVE 12/29/2018 1535   KETONESUR NEGATIVE 12/29/2018 1535   PROTEINUR TRACE (A) 12/29/2018 1535   UROBILINOGEN 1.0 07/27/2015 1052   NITRITE NEGATIVE 12/29/2018 1535   LEUKOCYTESUR LARGE (A) 12/29/2018 1535    Sepsis Labs: Lactic Acid, Venous    Component Value Date/Time   LATICACIDVEN 1.6 01/01/2019 0953    MICROBIOLOGY: Recent Results (from the past 240 hour(s))  Blood Culture (routine x 2)     Status: None (Preliminary result)   Collection Time: 12/29/18 12:23 PM  Result Value Ref Range Status    Specimen Description BLOOD RIGHT FOREARM  Final   Special Requests  Final    BOTTLES DRAWN AEROBIC AND ANAEROBIC Blood Culture adequate volume Performed at [facility], [address] [place], [place] [address]    Culture   Final    NO GROWTH 4 DAYS Performed at [facility], [address] [place], [place] [address]    Report Status PENDING  Incomplete  Urine culture     Status: Abnormal   Collection Time: 12/29/18 12:24 PM  Result Value Ref Range Status   Specimen Description   Final    URINE, RANDOM Performed at [facility], [address] [place], [place] [address]    Special Requests   Final    NONE Performed at [facility], [address] [place], [place] [address]    Culture MULTIPLE SPECIES PRESENT, SUGGEST RECOLLECTION (A)  Final   Report Status 12/31/2018 FINAL  Final  Blood Culture (routine x 2)     Status: Abnormal   Collection Time: 12/29/18 12:28 PM  Result Value Ref Range Status   Specimen Description BLOOD RIGHT ARM  Final   Special Requests   Final    BOTTLES DRAWN AEROBIC AND ANAEROBIC Blood Culture adequate volume Performed at [facility], [address] [place], [place] [address]    Culture  Setup Time   Final    GRAM NEGATIVE RODS ANAEROBIC BOTTLE ONLY Organism ID to follow CRITICAL RESULT CALLED TO, READ BACK BY AND VERIFIED WITH: Gay FillerM. Pham PharmD 9:45 12/30/18 ([email or web address]) Performed at [facility], [address] [place], [place] [address]    Culture ESCHERICHIA COLI (A)  Final   Report Status 01/01/2019 FINAL  Final   Organism ID, Bacteria ESCHERICHIA COLI  Final      Susceptibility   Escherichia coli - MIC*    AMPICILLIN >=32 RESISTANT Resistant     CEFAZOLIN 32 INTERMEDIATE Intermediate     CEFEPIME <=1 SENSITIVE Sensitive     CEFTAZIDIME <=1 SENSITIVE Sensitive     CEFTRIAXONE <=1 SENSITIVE Sensitive     CIPROFLOXACIN >=4 RESISTANT Resistant      GENTAMICIN <=1 SENSITIVE Sensitive     IMIPENEM <=0.25 SENSITIVE Sensitive     TRIMETH/SULFA <=20 SENSITIVE Sensitive     AMPICILLIN/SULBACTAM >=32 RESISTANT Resistant     PIP/TAZO >=128 RESISTANT Resistant     Extended ESBL NEGATIVE Sensitive     * ESCHERICHIA COLI  Blood Culture ID Panel (Reflexed)     Status: Abnormal   Collection Time: 12/29/18 12:28 PM  Result Value Ref Range Status   Enterococcus species NOT DETECTED NOT DETECTED Final   Listeria monocytogenes NOT DETECTED NOT DETECTED Final   Staphylococcus species NOT DETECTED NOT DETECTED Final   Staphylococcus aureus (BCID) NOT DETECTED NOT DETECTED Final   Streptococcus species NOT DETECTED NOT DETECTED Final   Streptococcus agalactiae NOT DETECTED NOT DETECTED Final   Streptococcus pneumoniae NOT DETECTED NOT DETECTED Final   Streptococcus pyogenes NOT DETECTED NOT DETECTED Final   Acinetobacter baumannii NOT DETECTED NOT DETECTED Final   Enterobacteriaceae species DETECTED (A) NOT DETECTED Final    Comment: Enterobacteriaceae represent a large family of gram-negative bacteria, not a single organism. CRITICAL RESULT CALLED TO, READ BACK BY AND VERIFIED WITH: Gay FillerM. Pham PharmD 9:45 12/30/18 (wilsonm)    Enterobacter cloacae complex NOT DETECTED NOT DETECTED Final   Escherichia coli DETECTED (A) NOT DETECTED Final    Comment: CRITICAL RESULT CALLED TO, READ BACK BY AND VERIFIED WITH: Gay FillerM. Pham PharmD 9:45 12/30/18 (wilsonm)  Klebsiella oxytoca NOT DETECTED NOT DETECTED Final   Klebsiella pneumoniae NOT DETECTED NOT DETECTED Final   Proteus species NOT DETECTED NOT DETECTED Final   Serratia marcescens NOT DETECTED NOT DETECTED Final   Carbapenem resistance NOT DETECTED NOT DETECTED Final   Haemophilus influenzae NOT DETECTED NOT DETECTED Final   Neisseria meningitidis NOT DETECTED NOT DETECTED Final   Pseudomonas aeruginosa NOT DETECTED NOT DETECTED Final   Candida albicans NOT DETECTED NOT DETECTED Final   Candida glabrata  NOT DETECTED NOT DETECTED Final   Candida krusei NOT DETECTED NOT DETECTED Final   Candida parapsilosis NOT DETECTED NOT DETECTED Final   Candida tropicalis NOT DETECTED NOT DETECTED Final    Comment: Performed at [facility], [address] [place], [place] [address]  MRSA PCR Screening     Status: None   Collection Time: 12/29/18  8:36 PM  Result Value Ref Range Status   MRSA by PCR NEGATIVE NEGATIVE Final    Comment:        The GeneXpert MRSA Assay (FDA approved for NASAL specimens only), is one component of a comprehensive MRSA colonization surveillance program. It is not intended to diagnose MRSA infection nor to guide or monitor treatment for MRSA infections. Performed at [facility], [address] [place], [place] [address]   Gastrointestinal Panel by PCR , Stool     Status: None   Collection Time: 12/30/18 10:17 PM  Result Value Ref Range Status   Campylobacter species NOT DETECTED NOT DETECTED Final   Plesimonas shigelloides NOT DETECTED NOT DETECTED Final   Salmonella species NOT DETECTED NOT DETECTED Final   Yersinia enterocolitica NOT DETECTED NOT DETECTED Final   Vibrio species NOT DETECTED NOT DETECTED Final   Vibrio cholerae NOT DETECTED NOT DETECTED Final   Enteroaggregative E coli (EAEC) NOT DETECTED NOT DETECTED Final   Enteropathogenic E coli (EPEC) NOT DETECTED NOT DETECTED Final   Enterotoxigenic E coli (ETEC) NOT DETECTED NOT DETECTED Final   Shiga like toxin producing E coli (STEC) NOT DETECTED NOT DETECTED Final   Shigella/Enteroinvasive E coli (EIEC) NOT DETECTED NOT DETECTED Final   Cryptosporidium NOT DETECTED NOT DETECTED Final   Cyclospora cayetanensis NOT DETECTED NOT DETECTED Final   Entamoeba histolytica NOT DETECTED NOT DETECTED Final   Giardia lamblia NOT DETECTED NOT DETECTED Final   Adenovirus F40/41 NOT DETECTED NOT DETECTED Final   Astrovirus NOT DETECTED NOT DETECTED Final   Norovirus GI/GII NOT DETECTED NOT DETECTED  Final   Rotavirus A NOT DETECTED NOT DETECTED Final   Sapovirus (I, II, IV, and V) NOT DETECTED NOT DETECTED Final    Comment: Performed at [facility], [address] [place], [place] [address]  Stool culture (children & immunocomp patients)     Status: None (Preliminary result)   Collection Time: 12/30/18 10:17 PM  Result Value Ref Range Status   Salmonella/Shigella Screen PENDING  Incomplete   Campylobacter Culture PENDING  Incomplete   E coli, Shiga toxin Assay Negative Negative Final    Comment: (NOTE) Performed At: [facility] [address] [place], [place] [id number] Jolene SchimkeNagendra Sanjai MD OZ:[phone]     RADIOLOGY STUDIES/RESULTS: Ct Abdomen Pelvis Wo Contrast  Result Date: 12/29/2018 CLINICAL DATA:  Abdomen distension two days ago here for food poisoning Allergic to iv contrast no oral contrast per MD EXAM: CT ABDOMEN AND PELVIS WITHOUT CONTRAST TECHNIQUE: Multidetector CT imaging of the abdomen and pelvis was performed following the standard protocol without IV contrast. COMPARISON:  12/27/2018 FINDINGS: Lower chest: There is bibasilar atelectasis. Hepatobiliary: The liver is homogeneous. Status post cholecystectomy. Pancreas: Pancreas is atrophic. Coarse calcifications throughout the pancreatic body and tail suggests history of chronic pancreatitis. Spleen: Normal in size without focal abnormality. Adrenals/Urinary Tract: Adrenal glands are unremarkable. Kidneys are normal, without renal calculi, focal lesion, or hydronephrosis. Bladder is unremarkable. Stomach/Bowel: Stomach is unremarkable. Small bowel loops are normal in appearance. There are numerous colonic diverticula. No acute diverticulitis. Significant stool within the rectosigmoid colon. There has been some improvement in the appearance of thickening of the rectosigmoid colon. No perirectal abscess. Vascular/Lymphatic: There is dense atherosclerotic calcification of the abdominal aorta not associated with  aneurysm. No retroperitoneal or mesenteric adenopathy. Reproductive: The uterus is present. Multiple mural calcifications are present. No adnexal mass. Other: No ascites. No abdominal wall hernia. Note is made of fat within the LEFT inguinal ring, likely a lipoma. Diffuse body wall edema. Musculoskeletal: Remote wedge compression fractures of T12, L1, and L3. There is 2 millimeters anterolisthesis of L4 on L5. Degenerative changes are identified at L4-5 and L5-S1. No suspicious lytic or blastic lesions are identified. IMPRESSION: 1. Significant stool within the rectosigmoid colon. There has been some improvement in the appearance of thickening of the rectosigmoid colon. 2. Diverticulosis without acute diverticulitis. 3. Status post cholecystectomy. 4. Changes of chronic pancreatitis. 5. Remote wedge compression fractures of T12, L1, and L3. Aortic Atherosclerosis (ICD10-I70.0). Electronically Signed   By: Norva Pavlov M.D.   On: 12/29/2018 13:12   Dg Chest Port 1 View  Result Date: 12/29/2018 CLINICAL DATA:  63 year old female with altered mental status, fever, decreased p.o. intake. EXAM: PORTABLE CHEST 1 VIEW COMPARISON:  Portable chest 11/13/2018 and earlier. FINDINGS: Portable AP semi upright view at 1320 hours. Sequelae of previous thoracic spine corpectomy and posterior fusion. Hardware appears stable. Stable visualized osseous structures. Stable lung volumes and mediastinal contours. Visualized tracheal air column is within normal limits. Chronic increased pulmonary interstitial markings appear stable since 2019. No pneumothorax, pleural effusion or acute pulmonary opacity. Mildly increased bowel gas in the abdomen. Stable cholecystectomy clips. IMPRESSION: Stable.  No acute cardiopulmonary abnormality. Electronically Signed   By: Odessa Fleming M.D.   On: 12/29/2018 14:03   Dg Abd Portable 1v  Result Date: 12/30/2018 CLINICAL DATA:  Abdominal pain EXAM: PORTABLE ABDOMEN - 1 VIEW COMPARISON:  CT  12/29/2018 FINDINGS: Surgical hardware in the lower thoracic spine. Dense aortic atherosclerosis. Mild diffuse increased bowel gas without obstructive pattern. Possible bowel wall thickening at the hepatic flexure and descending colon. No radiopaque calculi. IMPRESSION: Diffuse increased bowel gas without obstructive pattern. There may be bowel wall edema at the hepatic flexure and descending colon as may be seen with colitis Electronically Signed   By: Jasmine Pang M.D.   On: 12/30/2018 21:45   Dg Abd Portable 2v  Result Date: 01/01/2019 CLINICAL DATA:  Vomiting. EXAM: PORTABLE ABDOMEN - 2 VIEW COMPARISON:  Radiograph of December 30, 2018. FINDINGS: No small bowel dilatation is noted. Mildly dilated colon loop is seen in right side of abdomen which most likely represents ileus. Status post cholecystectomy. There is no evidence of free air. No radio-opaque calculi or other significant radiographic abnormality is seen. IMPRESSION: Mildly dilated colonic loops seen in right side of abdomen which may represent ileus. Electronically Signed   By: Lupita Raider, M.D.   On: 01/01/2019 14:50     LOS: 4 days   Jeoffrey Massed, MD  [facility]  If 7PM-7AM, please contact night-coverage  Please page via [email or web address]  Go to [email or web address] and use [place]'s universal password to access. If you do not have the password, please contact the hospital operator.  Locate the [facility] provider you are looking for under [facility] and page to a number that you can be directly reached. If you still have difficulty reaching the provider, please page the [facility] (Director on Call) for the Hospitalists listed on amion for assistance.  01/02/2019, 9:53 AM

## 2019-01-02 NOTE — Care Management Note (Addendum)
Case Management Note  Patient Details  Name: Amber [name] MRN: [id number] Date of Birth: 1956/09/10  Subjective/Objective:              Septic shock 2/2 E coli gastroenteritis w/ bacteremia  PCP: [name]  Action/Plan: Transition when medically stable with home health services to follow. Pt/ family declined SNF placement. Son [name] [phone]) states once pt discharges , 24/7 supervision/assistance will be provided by family.Granddaughter [name] lives with pt. No DME equipment needed.  Son to provide transportation to home.  Expected Discharge Date:   01/03/2019          Expected Discharge Plan:  Home w Home Health Services  In-House Referral:  Clinical Social Work  Discharge planning Services  CM Consult  Post Acute Care Choice:    Choice offered to:   adult children/[name]  DME Arranged:  N/A DME Agency:  NA  HH Arranged:  RN, PT HH Agency:  [facility], pending acceptance from agency.   01/02/2019 @ 1:59 pm NCM received call from [facility]. [facility] liaison stated they will not be able to accept/ provide home health services to pt.  Referral made with [facility]....  Status of Service:  Completed, signed off  If discussed at Long Length of Stay Meetings, dates discussed:    Additional Comments: 01/02/2019 @ 2:33 pm [facility] agreed to accept pt for home health services.    Gae Gallop [place], RN 01/02/2019, 1:27 PM

## 2019-01-02 NOTE — Progress Notes (Signed)
  Speech Language Pathology Treatment: Dysphagia  Patient Details Name: Amber [name] MRN: [id number] DOB: January 08, 1956 Today's Date: 01/02/2019 Time: 3159-4585 SLP Time Calculation (min) (ACUTE ONLY): 15 min  Assessment / Plan / Recommendation Clinical Impression  Skilled treatment session focused on dysphagia goals. SLP received pt upright in bed being fed by NT with RN present. Pt consuming clear liquid diet without overt s/s of aspiration. Pt is alert and voice remains clear throughout consumption. Pt is overall pleasantly confused but is able to recall previous diets during past hospitalizations and refuses puree diet (even as possible option). Pt stated that she consumed "regular food" at home. SLP facilitiated session by providing skilled observation of pt consuming impulsive bite of graham cracker. Pt with prolonged mastication and SLP could visualize that she was rotating cracker until it was soften with saliva. Afterwards, pt stated that she only ate soft foods such as rice and gravy, mashed potatoes. Pt is appropriate for upgrade to full liquid diet with continued supervision during consumption. Pt's family is educated on soft food items that pt can return to eating at discharge. Pt is safest on puree diet within this setting but refuses. She is agreeable to full liquid. ST to sign off at this time. All education provided at this time.    HPI HPI: Pt is a 63 y.o. female admitted 12/29/18 for hypotension with recent admission to [facility] for food poisoning and electrolyte abnormalities (d/c home on 12/28/18).  Pt came to ED not feeling well and found to be hyptensive.  Pt dx with septic shock due to E coli gastroenteritis with bacteremia, lactic acidosis, hypokalemia, elevated troponin (suspected demand ischemia), mild AKI, and multiple episodes of hypoglycemia (h/o DM and poor po intake).  Pt with other significant PMH of HTN, embolic stroke, focal seizure, ACS, frequent falls,  back surgery, PNA, and GERD. Pt has had several BSEs in the past (2018, x2 in 2019), all of which recommended thin liquids. MBS was performed in October 2019 at [facility] with report suggesting an oral dysphagia although with flash penetration with thin liquids. Dys 2 diet and thin liquids were recommended.       SLP Plan  Discharge SLP treatment due to (comment)(goals met - pt at baseline)       Recommendations  Diet recommendations: Thin liquid(full liquid diet) Liquids provided via: Straw;Cup;Teaspoon Medication Administration: Crushed with puree Supervision: Staff to assist with self feeding;Full supervision/cueing for compensatory strategies Compensations: Slow rate;Small sips/bites Postural Changes and/or Swallow Maneuvers: Seated upright 90 degrees                Oral Care Recommendations: Oral care BID Follow up Recommendations: Skilled Nursing facility SLP Visit Diagnosis: Dysphagia, unspecified (R13.10) Plan: Discharge SLP treatment due to (comment)(goals met - pt at baseline)       [place] 01/02/2019, 1:55 PM

## 2019-01-02 NOTE — Progress Notes (Signed)
CRITICAL VALUE ALERT  Critical Value:  K 2.7  Date & Time Notied:  2/13 0857  Provider Notified: Ghimire  Orders Received/Actions taken: Awaiting orders at this time.

## 2019-01-03 DIAGNOSIS — K861 Other chronic pancreatitis: Secondary | ICD-10-CM

## 2019-01-03 DIAGNOSIS — I10 Essential (primary) hypertension: Secondary | ICD-10-CM

## 2019-01-03 LAB — MAGNESIUM: MAGNESIUM: 1.6 mg/dL — AB (ref 1.7–2.4)

## 2019-01-03 LAB — BASIC METABOLIC PANEL
Anion gap: 11 (ref 5–15)
Anion gap: 12 (ref 5–15)
Anion gap: 11 (ref 5–15)
BUN: 6 mg/dL (ref 6–20)
BUN: 5 mg/dL — ABNORMAL LOW (ref 6–20)
BUN: 5 mg/dL — ABNORMAL LOW (ref 6–20)
CALCIUM: 7.3 mg/dL — AB (ref 8.9–10.3)
CALCIUM: 7.3 mg/dL — AB (ref 8.9–10.3)
CHLORIDE: 107 mmol/L (ref 98–111)
CO2: 21 mmol/L — ABNORMAL LOW (ref 22–32)
CO2: 21 mmol/L — ABNORMAL LOW (ref 22–32)
CO2: 24 mmol/L (ref 22–32)
CREATININE: 0.84 mg/dL (ref 0.44–1.00)
Calcium: 7.3 mg/dL — ABNORMAL LOW (ref 8.9–10.3)
Chloride: 111 mmol/L (ref 98–111)
Chloride: 107 mmol/L (ref 98–111)
Creatinine, Ser: 0.87 mg/dL (ref 0.44–1.00)
Creatinine, Ser: 0.87 mg/dL (ref 0.44–1.00)
GFR calc Af Amer: >60 mL/min (ref >60)
GFR calc Af Amer: >60 mL/min (ref >60)
GFR calc Af Amer: >60 mL/min (ref >60)
GFR calc non Af Amer: >60 mL/min (ref >60)
GFR calc non Af Amer: >60 mL/min (ref >60)
GFR calc non Af Amer: >60 mL/min (ref >60)
GLUCOSE: 219 mg/dL — AB (ref 70–99)
Glucose, Bld: 121 mg/dL — ABNORMAL HIGH (ref 70–99)
Glucose, Bld: 170 mg/dL — ABNORMAL HIGH (ref 70–99)
Potassium: 2.7 mmol/L — CL (ref 3.5–5.1)
Potassium: 2.7 mmol/L — CL (ref 3.5–5.1)
Potassium: 2.3 mmol/L — CL (ref 3.5–5.1)
SODIUM: 143 mmol/L (ref 135–145)
SODIUM: 142 mmol/L (ref 135–145)
Sodium: 140 mmol/L (ref 135–145)

## 2019-01-03 LAB — CULTURE, BLOOD (ROUTINE X 2)
Culture: NO GROWTH
Special Requests: ADEQUATE

## 2019-01-03 MED ORDER — PREDNISONE 5 MG PO TABS: 5.0000 mg | ORAL_TABLET | Freq: Every day | ORAL | 0 refills | Status: DC

## 2019-01-03 MED ORDER — POTASSIUM CHLORIDE 10 MEQ/100ML IV SOLN
10.0000 meq | Freq: Once | INTRAVENOUS | Status: AC
  Administered 2019-01-04: 10 meq via INTRAVENOUS
  Filled 2019-01-03: qty 100

## 2019-01-03 MED ORDER — MAGNESIUM SULFATE 4 GM/100ML IV SOLN
4.0000 g | Freq: Once | INTRAVENOUS | Status: AC
  Administered 2019-01-03: 4 g via INTRAVENOUS
  Filled 2019-01-03: qty 100

## 2019-01-03 MED ORDER — SENNA 8.6 MG PO TABS: 2.0000 | ORAL_TABLET | Freq: Every day | ORAL | 0 refills | Status: DC

## 2019-01-03 MED ORDER — POTASSIUM CHLORIDE CRYS ER 20 MEQ PO TBCR
40.0000 meq | EXTENDED_RELEASE_TABLET | Freq: Every day | ORAL | Status: DC
  Administered 2019-01-03: 40 meq via ORAL
  Filled 2019-01-03: qty 2

## 2019-01-03 MED ORDER — POTASSIUM CHLORIDE CRYS ER 20 MEQ PO TBCR: 40.0000 meq | EXTENDED_RELEASE_TABLET | Freq: Two times a day (BID) | ORAL | Status: DC

## 2019-01-03 MED ORDER — OXYCODONE HCL 20 MG PO TABS: 20.0000 mg | ORAL_TABLET | Freq: Four times a day (QID) | ORAL | 0 refills | Status: DC | PRN

## 2019-01-03 MED ORDER — POTASSIUM CHLORIDE 10 MEQ/100ML IV SOLN
10.0000 meq | INTRAVENOUS | Status: DC
  Administered 2019-01-03 (×3): 10 meq via INTRAVENOUS
  Filled 2019-01-03 (×3): qty 100

## 2019-01-03 MED ORDER — POLYETHYLENE GLYCOL 3350 17 G PO PACK: 17.0000 g | PACK | Freq: Every day | ORAL | 0 refills | Status: DC

## 2019-01-03 MED ORDER — FUROSEMIDE 10 MG/ML IJ SOLN
40.0000 mg | Freq: Once | INTRAMUSCULAR | Status: AC
  Administered 2019-01-03: 40 mg via INTRAVENOUS
  Filled 2019-01-03: qty 4

## 2019-01-03 MED ORDER — POTASSIUM CHLORIDE CRYS ER 20 MEQ PO TBCR: 20.0000 meq | EXTENDED_RELEASE_TABLET | Freq: Every day | ORAL | 0 refills | Status: DC

## 2019-01-03 MED ORDER — SULFAMETHOXAZOLE-TRIMETHOPRIM 800-160 MG PO TABS
1.0000 | ORAL_TABLET | Freq: Two times a day (BID) | ORAL | Status: DC
  Administered 2019-01-03 – 2019-01-04 (×3): 1 via ORAL
  Filled 2019-01-03 (×3): qty 1

## 2019-01-03 MED ORDER — POTASSIUM CHLORIDE CRYS ER 20 MEQ PO TBCR
40.0000 meq | EXTENDED_RELEASE_TABLET | ORAL | Status: AC
  Administered 2019-01-03 (×2): 40 meq via ORAL
  Filled 2019-01-03 (×2): qty 2

## 2019-01-03 MED ORDER — SULFAMETHOXAZOLE-TRIMETHOPRIM 800-160 MG PO TABS: 1.0000 | ORAL_TABLET | Freq: Two times a day (BID) | ORAL | 0 refills | Status: DC

## 2019-01-03 MED ORDER — POTASSIUM CHLORIDE 10 MEQ/100ML IV SOLN
10.0000 meq | INTRAVENOUS | Status: AC
  Administered 2019-01-03 (×4): 10 meq via INTRAVENOUS
  Filled 2019-01-03 (×3): qty 100

## 2019-01-03 NOTE — Progress Notes (Signed)
CRITICAL VALUE ALERT  Critical Value:  k2.7  Date & Time Notied:  01/03/2019 1236   Provider Notified: Dr. [name]  Orders Received/Actions taken: no new orders will continue to monitor

## 2019-01-03 NOTE — Progress Notes (Signed)
CRITICAL VALUE ALERT  Critical Value:  K 2.3   Date & Time Notied:  01/03/19 1628    Provider Notified: Will page Dr. [name]  Orders Received/Actions taken:

## 2019-01-03 NOTE — Progress Notes (Signed)
Physical Therapy Treatment Patient Details Name: Amber [name] MRN: [id number] DOB: 01/19/1956 Today's Date: 01/03/2019    History of Present Illness 63 y.o. female admitted 12/29/18 for hypotension with recent admission to [facility] for food poisoning and electrolyte abnormalities (d/c home on 12/28/18).  Pt came to ED not feeling well and found to be hyptensive.  PT dx with septic shock due to E coli gastroenteritis with bacteremia, lactic acidosis, hypokalemia, elevated troponin (suspected demand ischemia), mild AKI, and multiple episodes of hypoglycemia (h/o DM and poor po intake).  Pt with other significant PMH of HTN, embolic stroke, focal seizure, ACS, frequent falls and back surgery.     PT Comments    Patient received in bed, alert, requesting food. RN informed patient hungry. While performing LE exercises, discovered that patient and bed were soaking wet. With assist of NT to clean patient and changed bed, patient moved from supine to sitting edge of bed with total assist. Patient then stood at bedside for cleaning up with max/total assist x 1 min. Patient then pivoted to recliner with max/total assist. Patient will benefit from continued skilled PT to address her weakness and difficulty with basic mobility.         Follow Up Recommendations  SNF     Equipment Recommendations  Hospital bed    Recommendations for Other Services       Precautions / Restrictions Precautions Precautions: Fall Restrictions Weight Bearing Restrictions: No    Mobility  Bed Mobility Overal bed mobility: Needs Assistance Bed Mobility: Supine to Sit   Sidelying to sit: Total assist Supine to sit: Total assist     General bed mobility comments: Does not assist with bed mobility    Transfers Overall transfer level: Needs assistance Equipment used: None Transfers: Sit to/from Stand;Stand Pivot Transfers Sit to Stand: Total assist Stand pivot transfers: Total assist          Ambulation/Gait             General Gait Details: unable at this time   Stairs             Wheelchair Mobility    Modified Rankin (Stroke Patients Only)       Balance Overall balance assessment: Needs assistance Sitting-balance support: Bilateral upper extremity supported;Feet supported Sitting balance-Leahy Scale: Poor       Standing balance-Leahy Scale: Zero Standing balance comment: requires total assist to stand at bedside to be cleaned as bed was soiled and soaking wet.                             Cognition Arousal/Alertness: Lethargic Behavior During Therapy: WFL for tasks assessed/performed Overall Cognitive Status: Within Functional Limits for tasks assessed Area of Impairment: Orientation;Attention;Memory;Following commands;Safety/judgement;Awareness;Problem solving                 Orientation Level: Disoriented to;Time;Situation Current Attention Level: Focused Memory: Decreased recall of precautions;Decreased short-term memory Following Commands: Follows one step commands inconsistently;Follows one step commands with increased time Safety/Judgement: Decreased awareness of safety;Decreased awareness of deficits Awareness: Intellectual Problem Solving: Slow processing;Decreased initiation;Difficulty sequencing;Requires verbal cues;Requires tactile cues        Exercises Other Exercises Other Exercises: ap, hip abd/add, attempted knee flexion  5 reps of each with assist B LE    General Comments        Pertinent Vitals/Pain Faces Pain Scale: Hurts whole lot Pain Location: back Pain Descriptors / Indicators: Aching;Discomfort;Moaning  Pain Intervention(s): Monitored during session;Repositioned;Patient requesting pain meds-RN notified    Home Living Family/patient expects to be discharged to:: Private residence Living Arrangements: Other relatives Available Help at Discharge: Family;Available PRN/intermittently;Personal  care attendant Type of Home: Apartment Home Access: Stairs to enter Entrance Stairs-Rails: None Home Layout: One level Home Equipment: Tub bench;Walker - 4 wheels;Bedside commode Additional Comments: lives with grandson and son around the corner, has aide come 7x a week, has help from son with ADL and IADls, ambulates with RW- info taken from previous encounter.    Prior Function Level of Independence: Needs assistance  Gait / Transfers Assistance Needed: patient reports that she was ambulating short distances in the home with RW prior to admission.  ADL's / Homemaking Assistance Needed: Per pt report, personal care assistant assists with ADL tasks 7 days/week, 3 hours/day     PT Goals (current goals can now be found in the care plan section) Acute Rehab PT Goals Patient Stated Goal: unable to state PT Goal Formulation: Patient unable to participate in goal setting Time For Goal Achievement: 01/14/19 Potential to Achieve Goals: Fair Progress towards PT goals: Progressing toward goals    Frequency    Min 2X/week      PT Plan Current plan remains appropriate    Co-evaluation              AM-PAC PT "6 Clicks" Mobility   Outcome Measure  Help needed turning from your back to your side while in a flat bed without using bedrails?: Total Help needed moving from lying on your back to sitting on the side of a flat bed without using bedrails?: Total Help needed moving to and from a bed to a chair (including a wheelchair)?: Total Help needed standing up from a chair using your arms (e.g., wheelchair or bedside chair)?: Total Help needed to walk in hospital room?: Total Help needed climbing 3-5 steps with a railing? : Total 6 Click Score: 6    End of Session Equipment Utilized During Treatment: Gait belt Activity Tolerance: Patient limited by pain;Patient limited by fatigue Patient left: in chair;with call bell/phone within reach Nurse Communication: Mobility status;Patient  requests pain meds PT Visit Diagnosis: Unsteadiness on feet (R26.81);Muscle weakness (generalized) (M62.81);Difficulty in walking, not elsewhere classified (R26.2);Pain Pain - part of body: (back)     Time: [id number] PT Time Calculation (min) (ACUTE ONLY): 30 min  Charges:  $Therapeutic Activity: 23-37 mins                     Kirstin Kugler, PT, GCS 01/03/19,10:57 AM

## 2019-01-03 NOTE — Progress Notes (Signed)
CRITICAL VALUE ALERT  Critical Value:  Potassium 2.7   Date & Time Notied:  01/03/19 0607  Provider Notified: Clance Boll, NP  Orders Received/Actions taken: awaiting orders.  Will continue to monitor the patient

## 2019-01-03 NOTE — Progress Notes (Signed)
E.coli is sens to bactrim. D/w Dr. [name] and will optimize her abx to Septra to complete 10d.  Scr<1, crcl~50 ml/min  Septra DS 1 BID x 5d  Ulyses Southward, PharmD, [place], AAHIVP, CPP Infectious Disease Pharmacist 01/03/2019 10:26 AM

## 2019-01-03 NOTE — Discharge Summary (Addendum)
PATIENT DETAILS Name: Amber [name] Age: 63 y.o. Sex: female Date of Birth: [birth year] MRN: [id number]. Admitting Physician: Alyson Reedy, MD PCP:Sun, Charyl Dancer, MD  Admit Date: 12/29/2018 Discharge date: 01/03/2019  Recommendations for Outpatient Follow-up:  1. Follow up with PCP in 1-2 weeks 2. Please obtain BMP/CBC in one week 3. Please repeat blood cultures in 5 to 7 days after patient completes a course of antimicrobial therapy.  Admitted From:  Home  Disposition: Home with home health services (per case management-family refused SNF)   Home Health: Yes  Equipment/Devices: None  Discharge Condition: Stable  CODE STATUS: FULL CODE  Diet recommendation:  Heart Healthy / Carb Modified-Full Liquid  Brief Summary: See H&P, Labs, Consult and Test reports for all details in brief, Patient is a 63 y.o. female with history of chronic diastolic heart failure, PAF on anticoagulation, embolic CVA,  seizure disorder, rheumatoid arthritis, prior back back surgeries, chronic pain syndrome-presented to the ED for hypotension, was found to have septic shock secondary to gram-negative bacteremia (she recently had a hospitalization in [place] per H&P for gastroenteritis).  See below for further details  Brief Hospital Course: Septic shock secondary to E. coli bacteremia: Sepsis pathophysiology has resolved, managed with IV Rocephin, since clinically improved, will transition to oral Bactrim and complete a 10-day (total) course of antimicrobial therapy.  Suspect bacteremia was secondary to recent GI illness, CT scan of the abdomen did not show any acute abnormalities or any foci of infection that could cause bacteremia.  Abdominal pain: Resolved-she has chronic pancreatitis at baseline-suspect worsening abdominal pain was from constipation.  She was given Dulcolax x1, and subsequently started on senna/MiraLAX.  Abdominal exam is benign this morning.   She has been resumed on her usual  home dosing of oxycodone and Creon during this hospital stay-and has been tolerating this without any major issues.    Lactic acidosis: Secondary to sepsis, has normalized.  Elevated troponin: Trend is flat-not consistent with ACS-likely demand ischemia.  EKG without acute changes.  Given poor overall health-bedbound status-best served by conservative management.  Hypokalemia/hypomagnesemia: Repleted-recheck electrolytes in 1 week by PCP.  PAF: Sinus rhythm-has prior history of embolic stroke-continue Eliquis.  Chronic thrombocytopenia: Unclear etiology-platelets close to usual baseline.  Follow  Anemia: Suspect has chronic anemia at baseline-worsened by acute illness, has required 1 unit of PRBC transfusion during this hospital stay.  Hemoglobin low but stable-follow closely in the outpatient setting.  History of rheumatoid arthritis with chronic steroid use: Appears to be on chronic prednisone-was given stress dose IV Solu-Cortef-we will resume prednisone 5 mg daily on discharge.   Chronic pancreatitis: Continue Creon  History of seizures: Continue Depakote  History of CVA: Reviewed records in care everywhere-she has had prior embolic strokes-difficult neurologic exam due to lethargy/weakness-but appears nonfocal-on Eliquis  Generalized deconditioning/worsening debility: Per patient-she has not ambulated in 4 years-mostly bed to wheelchair bound.    Per social work/case management-family is planning to take her home-they have refused SNF.     Chronic pain syndrome (back pain, neck pain, abdominal pain): Continue prn oxycodone (home dose)-continue bowel regimen with MiraLAX/senna.    Procedures/Studies: None  Discharge Diagnoses:  Active Problems:   Septic shock [facility])   Discharge Instructions:  Activity:  As tolerated with Full fall precautions use walker/cane & assistance as needed   Discharge Instructions    Call MD for:  persistant nausea and vomiting    Complete by:  As directed    Call MD for:  temperature >  100.4   Complete by:  As directed    Diet - low sodium heart healthy   Complete by:  As directed    Diet recommendations: Thin liquid(full liquid diet) Liquids provided via: Straw;Cup;Teaspoon Medication Administration: Crushed with puree Supervision: Staff to assist with self feeding;Full supervision/cueing for compensatory strategies Compensations: Slow rate;Small sips/bites Postural Changes and/or Swallow Maneuvers: Seated upright 90 degrees   Discharge instructions   Complete by:  As directed    Follow with Primary MD  [name], MD in 1 week  Please get a complete blood count and chemistry panel checked by your Primary MD at your next visit, and again as instructed by your Primary MD.  Get Medicines reviewed and adjusted: Please take all your medications with you for your next visit with your Primary MD  Laboratory/radiological data: Please request your Primary MD to go over all hospital tests and procedure/radiological results at the follow up, please ask your Primary MD to get all Hospital records sent to his/her office.  In some cases, they will be blood work, cultures and biopsy results pending at the time of your discharge. Please request that your primary care M.D. follows up on these results.  Also Note the following: If you experience worsening of your admission symptoms, develop shortness of breath, life threatening emergency, suicidal or homicidal thoughts you must seek medical attention immediately by calling 911 or calling your MD immediately  if symptoms less severe.  You must read complete instructions/literature along with all the possible adverse reactions/side effects for all the Medicines you take and that have been prescribed to you. Take any new Medicines after you have completely understood and accpet all the possible adverse reactions/side effects.   Do not drive when taking Pain medications or sleeping  medications (Benzodaizepines)  Do not take more than prescribed Pain, Sleep and Anxiety Medications. It is not advisable to combine anxiety,sleep and pain medications without talking with your primary care practitioner  Special Instructions: If you have smoked or chewed Tobacco  in the last 2 yrs please stop smoking, stop any regular Alcohol  and or any Recreational drug use.  Wear Seat belts while driving.  Please note: You were cared for by a hospitalist during your hospital stay. Once you are discharged, your primary care physician will handle any further medical issues. Please note that NO REFILLS for any discharge medications will be authorized once you are discharged, as it is imperative that you return to your primary care physician (or establish a relationship with a primary care physician if you do not have one) for your post hospital discharge needs so that they can reassess your need for medications and monitor your lab values.   Increase activity slowly   Complete by:  As directed      Allergies as of 01/03/2019      Reactions   Ivp Dye [iodinated Diagnostic Agents] Itching   Metrizamide Itching      Medication List    STOP taking these medications   esomeprazole 40 MG capsule Commonly known as:  NEXIUM     TAKE these medications   allopurinol 300 MG tablet Commonly known as:  ZYLOPRIM Take 300 mg by mouth daily.   apixaban 2.5 MG Tabs tablet Commonly known as:  ELIQUIS Take 2.5 mg by mouth 2 (two) times daily.   atorvastatin 10 MG tablet Commonly known as:  LIPITOR Take 10 mg by mouth daily at 6 PM.   colestipol 1 g tablet Commonly known  as:  COLESTID Take 1 g by mouth 2 (two) times daily.   feeding supplement (ENSURE ENLIVE) Liqd Take 237 mLs by mouth 2 (two) times daily between meals.   furosemide 20 MG tablet Commonly known as:  LASIX Take 1 tablet (20 mg total) by mouth daily.   metoprolol succinate 25 MG 24 hr tablet Commonly known as:   TOPROL-XL Take 1 tablet (25 mg total) by mouth daily.   multivitamin with minerals Tabs tablet Take 1 tablet by mouth daily.   Oxycodone HCl 20 MG Tabs Take 1 tablet (20 mg total) by mouth 4 (four) times daily as needed. What changed:    when to take this  reasons to take this   Pancrelipase (Lip-Prot-Amyl) 24000-76000 units Cpep Take 3 capsules by mouth 3 (three) times daily.   pantoprazole 40 MG tablet Commonly known as:  PROTONIX Take 40 mg by mouth 2 (two) times daily.   polyethylene glycol packet Commonly known as:  MIRALAX / GLYCOLAX Take 17 g by mouth daily.   potassium chloride SA 20 MEQ tablet Commonly known as:  K-DUR,KLOR-CON Take 1 tablet (20 mEq total) by mouth daily. Start taking on:  January 04, 2019   predniSONE 5 MG tablet Commonly known as:  DELTASONE Take 1 tablet (5 mg total) by mouth daily with breakfast. Restart after prednisone taper. What changed:  when to take this   senna 8.6 MG Tabs tablet Commonly known as:  SENOKOT Take 2 tablets (17.2 mg total) by mouth at bedtime.   sulfamethoxazole-trimethoprim 800-160 MG tablet Commonly known as:  BACTRIM DS,SEPTRA DS Take 1 tablet by mouth every 12 (twelve) hours.   valproic acid 250 MG capsule Commonly known as:  DEPAKENE Take 500 mg by mouth 3 (three) times daily.      Follow-up Information    [name], MD. Schedule an appointment as soon as possible for a visit in 1 week(s).   Specialty:  Internal Medicine Contact information: [address] [place] [address] [phone]        [facility] Follow up.   Specialty:  Home Health Services Why:  Home health services arranged Contact information: [address] [place] [address] [phone]          Allergies  Allergen Reactions  . Ivp Dye [Iodinated Diagnostic Agents] Itching  . Metrizamide Itching      Consultations:   None  Other Procedures/Studies: Ct Abdomen Pelvis Wo  Contrast  Result Date: 12/29/2018 CLINICAL DATA:  Abdomen distension two days ago here for food poisoning Allergic to iv contrast no oral contrast per MD EXAM: CT ABDOMEN AND PELVIS WITHOUT CONTRAST TECHNIQUE: Multidetector CT imaging of the abdomen and pelvis was performed following the standard protocol without IV contrast. COMPARISON:  12/27/2018 FINDINGS: Lower chest: There is bibasilar atelectasis. Hepatobiliary: The liver is homogeneous. Status post cholecystectomy. Pancreas: Pancreas is atrophic. Coarse calcifications throughout the pancreatic body and tail suggests history of chronic pancreatitis. Spleen: Normal in size without focal abnormality. Adrenals/Urinary Tract: Adrenal glands are unremarkable. Kidneys are normal, without renal calculi, focal lesion, or hydronephrosis. Bladder is unremarkable. Stomach/Bowel: Stomach is unremarkable. Small bowel loops are normal in appearance. There are numerous colonic diverticula. No acute diverticulitis. Significant stool within the rectosigmoid colon. There has been some improvement in the appearance of thickening of the rectosigmoid colon. No perirectal abscess. Vascular/Lymphatic: There is dense atherosclerotic calcification of the abdominal aorta not associated with aneurysm. No retroperitoneal or mesenteric adenopathy. Reproductive:  The uterus is present. Multiple mural calcifications are present. No adnexal mass. Other: No ascites. No abdominal wall hernia. Note is made of fat within the LEFT inguinal ring, likely a lipoma. Diffuse body wall edema. Musculoskeletal: Remote wedge compression fractures of T12, L1, and L3. There is 2 millimeters anterolisthesis of L4 on L5. Degenerative changes are identified at L4-5 and L5-S1. No suspicious lytic or blastic lesions are identified. IMPRESSION: 1. Significant stool within the rectosigmoid colon. There has been some improvement in the appearance of thickening of the rectosigmoid colon. 2. Diverticulosis without  acute diverticulitis. 3. Status post cholecystectomy. 4. Changes of chronic pancreatitis. 5. Remote wedge compression fractures of T12, L1, and L3. Aortic Atherosclerosis (ICD10-I70.0). Electronically Signed   By: Norva Pavlov M.D.   On: 12/29/2018 13:12   Dg Chest Port 1 View  Result Date: 12/29/2018 CLINICAL DATA:  62 year old female with altered mental status, fever, decreased p.o. intake. EXAM: PORTABLE CHEST 1 VIEW COMPARISON:  Portable chest 11/13/2018 and earlier. FINDINGS: Portable AP semi upright view at 1320 hours. Sequelae of previous thoracic spine corpectomy and posterior fusion. Hardware appears stable. Stable visualized osseous structures. Stable lung volumes and mediastinal contours. Visualized tracheal air column is within normal limits. Chronic increased pulmonary interstitial markings appear stable since 2019. No pneumothorax, pleural effusion or acute pulmonary opacity. Mildly increased bowel gas in the abdomen. Stable cholecystectomy clips. IMPRESSION: Stable.  No acute cardiopulmonary abnormality. Electronically Signed   By: Odessa Fleming M.D.   On: 12/29/2018 14:03   Dg Abd Portable 1v  Result Date: 12/30/2018 CLINICAL DATA:  Abdominal pain EXAM: PORTABLE ABDOMEN - 1 VIEW COMPARISON:  CT 12/29/2018 FINDINGS: Surgical hardware in the lower thoracic spine. Dense aortic atherosclerosis. Mild diffuse increased bowel gas without obstructive pattern. Possible bowel wall thickening at the hepatic flexure and descending colon. No radiopaque calculi. IMPRESSION: Diffuse increased bowel gas without obstructive pattern. There may be bowel wall edema at the hepatic flexure and descending colon as may be seen with colitis Electronically Signed   By: Jasmine Pang M.D.   On: 12/30/2018 21:45   Dg Abd Portable 2v  Result Date: 01/01/2019 CLINICAL DATA:  Vomiting. EXAM: PORTABLE ABDOMEN - 2 VIEW COMPARISON:  Radiograph of December 30, 2018. FINDINGS: No small bowel dilatation is noted. Mildly dilated  colon loop is seen in right side of abdomen which most likely represents ileus. Status post cholecystectomy. There is no evidence of free air. No radio-opaque calculi or other significant radiographic abnormality is seen. IMPRESSION: Mildly dilated colonic loops seen in right side of abdomen which may represent ileus. Electronically Signed   By: Lupita Raider, M.D.   On: 01/01/2019 14:50     TODAY-DAY OF DISCHARGE:  Subjective:   Wrenley Yeomans today has no headache,no chest abdominal pain,no new weakness tingling or numbness, feels much better wants to go home today.   Objective:   Blood pressure (!) 155/96, pulse 90, temperature 98.1 F (36.7 C), temperature source Oral, resp. rate 20, height 5' (1.524 m), weight 50.1 kg, SpO2 98 %.  Intake/Output Summary (Last 24 hours) at 01/03/2019 1457 Last data filed at 01/03/2019 0900 Gross per 24 hour  Intake 240 ml  Output 1700 ml  Net -1460 ml   Filed Weights   12/29/18 1229 12/29/18 2040  Weight: 40.5 kg 50.1 kg    Exam: Awake Alert, Oriented *3, No new F.N deficits, Normal affect [place].AT,PERRAL Supple Neck,No JVD, No cervical lymphadenopathy appriciated.  Symmetrical Chest wall movement, Good air movement  bilaterally, CTAB RRR,No Gallops,Rubs or new Murmurs, No Parasternal Heave +ve B.Sounds, Abd Soft, Non tender, No organomegaly appriciated, No rebound -guarding or rigidity. No Cyanosis, Clubbing or edema, No new Rash or bruise   PERTINENT RADIOLOGIC STUDIES: Ct Abdomen Pelvis Wo Contrast  Result Date: 12/29/2018 CLINICAL DATA:  Abdomen distension two days ago here for food poisoning Allergic to iv contrast no oral contrast per MD EXAM: CT ABDOMEN AND PELVIS WITHOUT CONTRAST TECHNIQUE: Multidetector CT imaging of the abdomen and pelvis was performed following the standard protocol without IV contrast. COMPARISON:  12/27/2018 FINDINGS: Lower chest: There is bibasilar atelectasis. Hepatobiliary: The liver is homogeneous. Status post  cholecystectomy. Pancreas: Pancreas is atrophic. Coarse calcifications throughout the pancreatic body and tail suggests history of chronic pancreatitis. Spleen: Normal in size without focal abnormality. Adrenals/Urinary Tract: Adrenal glands are unremarkable. Kidneys are normal, without renal calculi, focal lesion, or hydronephrosis. Bladder is unremarkable. Stomach/Bowel: Stomach is unremarkable. Small bowel loops are normal in appearance. There are numerous colonic diverticula. No acute diverticulitis. Significant stool within the rectosigmoid colon. There has been some improvement in the appearance of thickening of the rectosigmoid colon. No perirectal abscess. Vascular/Lymphatic: There is dense atherosclerotic calcification of the abdominal aorta not associated with aneurysm. No retroperitoneal or mesenteric adenopathy. Reproductive: The uterus is present. Multiple mural calcifications are present. No adnexal mass. Other: No ascites. No abdominal wall hernia. Note is made of fat within the LEFT inguinal ring, likely a lipoma. Diffuse body wall edema. Musculoskeletal: Remote wedge compression fractures of T12, L1, and L3. There is 2 millimeters anterolisthesis of L4 on L5. Degenerative changes are identified at L4-5 and L5-S1. No suspicious lytic or blastic lesions are identified. IMPRESSION: 1. Significant stool within the rectosigmoid colon. There has been some improvement in the appearance of thickening of the rectosigmoid colon. 2. Diverticulosis without acute diverticulitis. 3. Status post cholecystectomy. 4. Changes of chronic pancreatitis. 5. Remote wedge compression fractures of T12, L1, and L3. Aortic Atherosclerosis (ICD10-I70.0). Electronically Signed   By: Norva Pavlov M.D.   On: 12/29/2018 13:12   Dg Chest Port 1 View  Result Date: 12/29/2018 CLINICAL DATA:  63 year old female with altered mental status, fever, decreased p.o. intake. EXAM: PORTABLE CHEST 1 VIEW COMPARISON:  Portable chest  11/13/2018 and earlier. FINDINGS: Portable AP semi upright view at 1320 hours. Sequelae of previous thoracic spine corpectomy and posterior fusion. Hardware appears stable. Stable visualized osseous structures. Stable lung volumes and mediastinal contours. Visualized tracheal air column is within normal limits. Chronic increased pulmonary interstitial markings appear stable since 2019. No pneumothorax, pleural effusion or acute pulmonary opacity. Mildly increased bowel gas in the abdomen. Stable cholecystectomy clips. IMPRESSION: Stable.  No acute cardiopulmonary abnormality. Electronically Signed   By: Odessa Fleming M.D.   On: 12/29/2018 14:03   Dg Abd Portable 1v  Result Date: 12/30/2018 CLINICAL DATA:  Abdominal pain EXAM: PORTABLE ABDOMEN - 1 VIEW COMPARISON:  CT 12/29/2018 FINDINGS: Surgical hardware in the lower thoracic spine. Dense aortic atherosclerosis. Mild diffuse increased bowel gas without obstructive pattern. Possible bowel wall thickening at the hepatic flexure and descending colon. No radiopaque calculi. IMPRESSION: Diffuse increased bowel gas without obstructive pattern. There may be bowel wall edema at the hepatic flexure and descending colon as may be seen with colitis Electronically Signed   By: Jasmine Pang M.D.   On: 12/30/2018 21:45   Dg Abd Portable 2v  Result Date: 01/01/2019 CLINICAL DATA:  Vomiting. EXAM: PORTABLE ABDOMEN - 2 VIEW COMPARISON:  Radiograph of December 30, 2018. FINDINGS: No small bowel dilatation is noted. Mildly dilated colon loop is seen in right side of abdomen which most likely represents ileus. Status post cholecystectomy. There is no evidence of free air. No radio-opaque calculi or other significant radiographic abnormality is seen. IMPRESSION: Mildly dilated colonic loops seen in right side of abdomen which may represent ileus. Electronically Signed   By: Lupita Raider, M.D.   On: 01/01/2019 14:50     PERTINENT LAB RESULTS: CBC: Recent Labs     01/01/19 0953 01/02/19 0700  WBC 19.8* 15.4*  HGB 9.5* 9.1*  HCT 28.5* 25.7*  PLT 69* 62*   CMET CMP     Component Value Date/Time   NA 140 01/03/2019 1124   K 2.7 (LL) 01/03/2019 1124   CL 107 01/03/2019 1124   CO2 21 (L) 01/03/2019 1124   GLUCOSE 219 (H) 01/03/2019 1124   BUN 5 (L) 01/03/2019 1124   CREATININE 0.87 01/03/2019 1124   CALCIUM 7.3 (L) 01/03/2019 1124   PROT 5.0 (L) 01/01/2019 0953   ALBUMIN 1.8 (L) 01/01/2019 0953   AST 41 01/01/2019 0953   ALT 24 01/01/2019 0953   ALKPHOS 171 (H) 01/01/2019 0953   BILITOT 0.6 01/01/2019 0953   GFRNONAA >60 01/03/2019 1124   GFRAA >60 01/03/2019 1124    GFR Estimated Creatinine Clearance: 49.4 mL/min (by C-G formula based on SCr of 0.87 mg/dL). No results for input(s): LIPASE, AMYLASE in the last 72 hours. Recent Labs    12/31/18 1632 01/01/19 0953  TROPONINI 0.20* 0.34*   Invalid input(s): POCBNP No results for input(s): DDIMER in the last 72 hours. No results for input(s): HGBA1C in the last 72 hours. No results for input(s): CHOL, HDL, LDLCALC, TRIG, CHOLHDL, LDLDIRECT in the last 72 hours. No results for input(s): TSH, T4TOTAL, T3FREE, THYROIDAB in the last 72 hours.  Invalid input(s): FREET3 Recent Labs    01/01/19 0953  VITAMINB12 898  FOLATE 13.1   Coags: No results for input(s): INR in the last 72 hours.  Invalid input(s): PT Microbiology: Recent Results (from the past 240 hour(s))  Blood Culture (routine x 2)     Status: None   Collection Time: 12/29/18 12:23 PM  Result Value Ref Range Status   Specimen Description BLOOD RIGHT FOREARM  Final   Special Requests   Final    BOTTLES DRAWN AEROBIC AND ANAEROBIC Blood Culture adequate volume Performed at [facility], [address], [place], [place] [address]    Culture   Final    NO GROWTH 5 DAYS Performed at [facility], [address], [place], [place] [address]    Report Status 01/03/2019 FINAL  Final  Urine  culture     Status: Abnormal   Collection Time: 12/29/18 12:24 PM  Result Value Ref Range Status   Specimen Description   Final    URINE, RANDOM Performed at [facility], [address], [place], [place] [address]    Special Requests   Final    NONE Performed at [facility], [address], [place], [place] [address]    Culture MULTIPLE SPECIES PRESENT, SUGGEST RECOLLECTION (A)  Final   Report Status 12/31/2018 FINAL  Final  Blood Culture (routine x 2)     Status: Abnormal   Collection Time: 12/29/18 12:28 PM  Result Value Ref Range Status   Specimen Description BLOOD RIGHT ARM  Final   Special Requests   Final  BOTTLES DRAWN AEROBIC AND ANAEROBIC Blood Culture adequate volume Performed at [facility], [address], [place], [place] [address]    Culture  Setup Time   Final    GRAM NEGATIVE RODS ANAEROBIC BOTTLE ONLY Organism ID to follow CRITICAL RESULT CALLED TO, READ BACK BY AND VERIFIED WITH: Gay Filler PharmD 9:45 12/30/18 ([email or web address]) Performed at [facility], [address], [place], [place] [address]    Culture ESCHERICHIA COLI (A)  Final   Report Status 01/01/2019 FINAL  Final   Organism ID, Bacteria ESCHERICHIA COLI  Final      Susceptibility   Escherichia coli - MIC*    AMPICILLIN >=32 RESISTANT Resistant     CEFAZOLIN 32 INTERMEDIATE Intermediate     CEFEPIME <=1 SENSITIVE Sensitive     CEFTAZIDIME <=1 SENSITIVE Sensitive     CEFTRIAXONE <=1 SENSITIVE Sensitive     CIPROFLOXACIN >=4 RESISTANT Resistant     GENTAMICIN <=1 SENSITIVE Sensitive     IMIPENEM <=0.25 SENSITIVE Sensitive     TRIMETH/SULFA <=20 SENSITIVE Sensitive     AMPICILLIN/SULBACTAM >=32 RESISTANT Resistant     PIP/TAZO >=128 RESISTANT Resistant     Extended ESBL NEGATIVE Sensitive     * ESCHERICHIA COLI  Blood Culture ID Panel (Reflexed)     Status: Abnormal   Collection Time: 12/29/18 12:28 PM  Result Value Ref Range Status    Enterococcus species NOT DETECTED NOT DETECTED Final   Listeria monocytogenes NOT DETECTED NOT DETECTED Final   Staphylococcus species NOT DETECTED NOT DETECTED Final   Staphylococcus aureus (BCID) NOT DETECTED NOT DETECTED Final   Streptococcus species NOT DETECTED NOT DETECTED Final   Streptococcus agalactiae NOT DETECTED NOT DETECTED Final   Streptococcus pneumoniae NOT DETECTED NOT DETECTED Final   Streptococcus pyogenes NOT DETECTED NOT DETECTED Final   Acinetobacter baumannii NOT DETECTED NOT DETECTED Final   Enterobacteriaceae species DETECTED (A) NOT DETECTED Final    Comment: Enterobacteriaceae represent a large family of gram-negative bacteria, not a single organism. CRITICAL RESULT CALLED TO, READ BACK BY AND VERIFIED WITH: Gay Filler PharmD 9:45 12/30/18 ([email or web address])    Enterobacter cloacae complex NOT DETECTED NOT DETECTED Final   Escherichia coli DETECTED (A) NOT DETECTED Final    Comment: CRITICAL RESULT CALLED TO, READ BACK BY AND VERIFIED WITH: Gay Filler PharmD 9:45 12/30/18 ([email or web address])    Klebsiella oxytoca NOT DETECTED NOT DETECTED Final   Klebsiella pneumoniae NOT DETECTED NOT DETECTED Final   Proteus species NOT DETECTED NOT DETECTED Final   Serratia marcescens NOT DETECTED NOT DETECTED Final   Carbapenem resistance NOT DETECTED NOT DETECTED Final   Haemophilus influenzae NOT DETECTED NOT DETECTED Final   Neisseria meningitidis NOT DETECTED NOT DETECTED Final   Pseudomonas aeruginosa NOT DETECTED NOT DETECTED Final   Candida albicans NOT DETECTED NOT DETECTED Final   Candida glabrata NOT DETECTED NOT DETECTED Final   Candida krusei NOT DETECTED NOT DETECTED Final   Candida parapsilosis NOT DETECTED NOT DETECTED Final   Candida tropicalis NOT DETECTED NOT DETECTED Final    Comment: Performed at [facility], [address], [place], [place] [address]  MRSA PCR Screening     Status: None   Collection Time: 12/29/18  8:36 PM  Result Value Ref Range Status   MRSA by PCR  NEGATIVE NEGATIVE Final    Comment:        The GeneXpert MRSA Assay (FDA approved for NASAL specimens only), is one component of a comprehensive MRSA colonization  surveillance program. It is not intended to diagnose MRSA infection nor to guide or monitor treatment for MRSA infections. Performed at [facility], [address] [place], [place] [address]   Gastrointestinal Panel by PCR , Stool     Status: None   Collection Time: 12/30/18 10:17 PM  Result Value Ref Range Status   Campylobacter species NOT DETECTED NOT DETECTED Final   Plesimonas shigelloides NOT DETECTED NOT DETECTED Final   Salmonella species NOT DETECTED NOT DETECTED Final   Yersinia enterocolitica NOT DETECTED NOT DETECTED Final   Vibrio species NOT DETECTED NOT DETECTED Final   Vibrio cholerae NOT DETECTED NOT DETECTED Final   Enteroaggregative E coli (EAEC) NOT DETECTED NOT DETECTED Final   Enteropathogenic E coli (EPEC) NOT DETECTED NOT DETECTED Final   Enterotoxigenic E coli (ETEC) NOT DETECTED NOT DETECTED Final   Shiga like toxin producing E coli (STEC) NOT DETECTED NOT DETECTED Final   Shigella/Enteroinvasive E coli (EIEC) NOT DETECTED NOT DETECTED Final   Cryptosporidium NOT DETECTED NOT DETECTED Final   Cyclospora cayetanensis NOT DETECTED NOT DETECTED Final   Entamoeba histolytica NOT DETECTED NOT DETECTED Final   Giardia lamblia NOT DETECTED NOT DETECTED Final   Adenovirus F40/41 NOT DETECTED NOT DETECTED Final   Astrovirus NOT DETECTED NOT DETECTED Final   Norovirus GI/GII NOT DETECTED NOT DETECTED Final   Rotavirus A NOT DETECTED NOT DETECTED Final   Sapovirus (I, II, IV, and V) NOT DETECTED NOT DETECTED Final    Comment: Performed at [facility], [address] [place], [place] [address]  Stool culture (children & immunocomp patients)     Status: None (Preliminary result)   Collection Time: 12/30/18 10:17 PM  Result Value Ref Range Status   Salmonella/Shigella Screen Final  report  Final   Campylobacter Culture PENDING  Incomplete   E coli, Shiga toxin Assay Negative Negative Final    Comment: (NOTE) Performed At: [facility] [address] [place], [place] [id number] Jolene SchimkeNagendra Sanjai MD WG:[id number]:[phone]   STOOL CULTURE REFLEX - [facility]     Status: None   Collection Time: 12/30/18 10:17 PM  Result Value Ref Range Status   Stool Culture result 1 ([facility]) Comment  Final    Comment: (NOTE) No Salmonella or Shigella recovered. Performed At: [facility] [address] [place], [place] [id number] Jolene SchimkeNagendra Sanjai MD MW:[id number]:[phone]     FURTHER DISCHARGE INSTRUCTIONS:  Get Medicines reviewed and adjusted: Please take all your medications with you for your next visit with your Primary MD  Laboratory/radiological data: Please request your Primary MD to go over all hospital tests and procedure/radiological results at the follow up, please ask your Primary MD to get all Hospital records sent to his/her office.  In some cases, they will be blood work, cultures and biopsy results pending at the time of your discharge. Please request that your primary care M.D. goes through all the records of your hospital data and follows up on these results.  Also Note the following: If you experience worsening of your admission symptoms, develop shortness of breath, life threatening emergency, suicidal or homicidal thoughts you must seek medical attention immediately by calling 911 or calling your MD immediately  if symptoms less severe.  You must read complete instructions/literature along with all the possible adverse reactions/side effects for all the Medicines you take and that have been prescribed to you. Take any new Medicines after you have completely understood and accpet all the possible adverse reactions/side effects.   Do  not drive when taking Pain medications or sleeping medications (Benzodaizepines)  Do not take more than prescribed Pain, Sleep and Anxiety  Medications. It is not advisable to combine anxiety,sleep and pain medications without talking with your primary care practitioner  Special Instructions: If you have smoked or chewed Tobacco  in the last 2 yrs please stop smoking, stop any regular Alcohol  and or any Recreational drug use.  Wear Seat belts while driving.  Please note: You were cared for by a hospitalist during your hospital stay. Once you are discharged, your primary care physician will handle any further medical issues. Please note that NO REFILLS for any discharge medications will be authorized once you are discharged, as it is imperative that you return to your primary care physician (or establish a relationship with a primary care physician if you do not have one) for your post hospital discharge needs so that they can reassess your need for medications and monitor your lab values.  Total Time spent coordinating discharge including counseling, education and face to face time equals 35 minutes.  SignedJeoffrey Massed: Exander Shaul 01/03/2019 2:57 PM

## 2019-01-04 ENCOUNTER — Inpatient Hospital Stay (HOSPITAL_COMMUNITY): Payer: Medicaid Other

## 2019-01-04 DIAGNOSIS — R69 Illness, unspecified: Secondary | ICD-10-CM

## 2019-01-04 LAB — BASIC METABOLIC PANEL
Anion gap: 10 (ref 5–15)
Anion gap: 8 (ref 5–15)
BUN: 5 mg/dL — ABNORMAL LOW (ref 6–20)
BUN: 6 mg/dL (ref 6–20)
CHLORIDE: 112 mmol/L — AB (ref 98–111)
CO2: 23 mmol/L (ref 22–32)
CO2: 22 mmol/L (ref 22–32)
Calcium: 7.4 mg/dL — ABNORMAL LOW (ref 8.9–10.3)
Calcium: 7.4 mg/dL — ABNORMAL LOW (ref 8.9–10.3)
Chloride: 108 mmol/L (ref 98–111)
Creatinine, Ser: 0.78 mg/dL (ref 0.44–1.00)
Creatinine, Ser: 0.81 mg/dL (ref 0.44–1.00)
GFR calc Af Amer: >60 mL/min (ref >60)
GFR calc Af Amer: >60 mL/min (ref >60)
GFR calc non Af Amer: >60 mL/min (ref >60)
GFR calc non Af Amer: >60 mL/min (ref >60)
Glucose, Bld: 111 mg/dL — ABNORMAL HIGH (ref 70–99)
Glucose, Bld: 76 mg/dL (ref 70–99)
POTASSIUM: 3.7 mmol/L (ref 3.5–5.1)
Potassium: 4.5 mmol/L (ref 3.5–5.1)
Sodium: 141 mmol/L (ref 135–145)
Sodium: 142 mmol/L (ref 135–145)

## 2019-01-04 LAB — INFLUENZA PANEL BY PCR (TYPE A & B)
Influenza A By PCR: NEGATIVE
Influenza B By PCR: POSITIVE — AB

## 2019-01-04 LAB — URINALYSIS, ROUTINE W REFLEX MICROSCOPIC
Bilirubin Urine: NEGATIVE
Glucose, UA: NEGATIVE mg/dL
Ketones, ur: NEGATIVE mg/dL
LEUKOCYTE UA: NEGATIVE
Nitrite: NEGATIVE
Protein, ur: 30 mg/dL — AB
Specific Gravity, Urine: 1.015 (ref 1.005–1.030)
pH: 7 (ref 5.0–8.0)

## 2019-01-04 LAB — STOOL CULTURE REFLEX - RSASHR

## 2019-01-04 LAB — STOOL CULTURE REFLEX - CMPCXR

## 2019-01-04 LAB — STOOL CULTURE: E coli, Shiga toxin Assay: NEGATIVE

## 2019-01-04 LAB — MAGNESIUM: Magnesium: 2.1 mg/dL (ref 1.7–2.4)

## 2019-01-04 MED ORDER — METRONIDAZOLE IN NACL 5-0.79 MG/ML-% IV SOLN
500.0000 mg | Freq: Three times a day (TID) | INTRAVENOUS | Status: DC
  Administered 2019-01-04 – 2019-01-07 (×8): 500 mg via INTRAVENOUS
  Filled 2019-01-04 (×6): qty 100

## 2019-01-04 MED ORDER — OXYCODONE HCL 20 MG PO TABS: 10.0000 mg | ORAL_TABLET | Freq: Four times a day (QID) | ORAL | 0 refills | Status: DC | PRN

## 2019-01-04 MED ORDER — LACTATED RINGERS IV SOLN
INTRAVENOUS | Status: DC
  Administered 2019-01-04: 15:00:00 via INTRAVENOUS

## 2019-01-04 MED ORDER — SODIUM CHLORIDE 0.9 % IV SOLN
2.0000 g | INTRAVENOUS | Status: DC
  Administered 2019-01-04 – 2019-01-06 (×3): 2 g via INTRAVENOUS
  Filled 2019-01-04 (×4): qty 20

## 2019-01-04 MED ORDER — ACETYLCYSTEINE 20 % IN SOLN
4.0000 mL | Freq: Once | RESPIRATORY_TRACT | Status: AC
  Administered 2019-01-04: 4 mL via RESPIRATORY_TRACT
  Filled 2019-01-04: qty 4

## 2019-01-04 MED ORDER — IPRATROPIUM-ALBUTEROL 0.5-2.5 (3) MG/3ML IN SOLN
3.0000 mL | Freq: Four times a day (QID) | RESPIRATORY_TRACT | Status: DC
  Administered 2019-01-04 (×3): 3 mL via RESPIRATORY_TRACT
  Filled 2019-01-04 (×2): qty 3

## 2019-01-04 MED ORDER — DILTIAZEM HCL 25 MG/5ML IV SOLN
10.0000 mg | Freq: Four times a day (QID) | INTRAVENOUS | Status: DC | PRN
  Filled 2019-01-04: qty 5

## 2019-01-04 MED ORDER — DILTIAZEM HCL 25 MG/5ML IV SOLN
10.0000 mg | Freq: Once | INTRAVENOUS | Status: AC
  Administered 2019-01-04: 10 mg via INTRAVENOUS
  Filled 2019-01-04: qty 5

## 2019-01-04 MED ORDER — METOPROLOL SUCCINATE ER 25 MG PO TB24
25.0000 mg | ORAL_TABLET | Freq: Every day | ORAL | Status: DC
  Administered 2019-01-04 – 2019-01-11 (×8): 25 mg via ORAL
  Filled 2019-01-04 (×8): qty 1

## 2019-01-04 MED ORDER — ACETYLCYSTEINE 10 % IN SOLN
4.0000 mL | Freq: Once | RESPIRATORY_TRACT | Status: DC
  Filled 2019-01-04: qty 4

## 2019-01-04 MED ORDER — NALOXONE HCL 0.4 MG/ML IJ SOLN
0.2000 mg | INTRAMUSCULAR | Status: DC | PRN
  Administered 2019-01-04 (×2): 0.2 mg via INTRAVENOUS
  Filled 2019-01-04 (×2): qty 1

## 2019-01-04 MED ORDER — MORPHINE SULFATE (PF) 2 MG/ML IV SOLN
1.0000 mg | Freq: Four times a day (QID) | INTRAVENOUS | Status: DC | PRN
  Administered 2019-01-04 – 2019-01-05 (×2): 1 mg via INTRAVENOUS
  Filled 2019-01-04 (×2): qty 1

## 2019-01-04 MED ORDER — IBUPROFEN 100 MG/5ML PO SUSP
400.0000 mg | Freq: Once | ORAL | Status: AC
  Administered 2019-01-04: 400 mg via ORAL
  Filled 2019-01-04: qty 20

## 2019-01-04 NOTE — Progress Notes (Signed)
This RN spoke with Dr. Ella Jubilee regarding pt's muse per staff request. He is currently in the room seeing pt and writing orders. He does not feel RRT is needed at this time. Will place pt on radar list and report off to [facility] RRT RN.

## 2019-01-04 NOTE — Discharge Instructions (Signed)

## 2019-01-04 NOTE — Progress Notes (Addendum)
[facility]                                                                                                                                                                         Patient Demographics  Amber [name], is a 63 y.o. female  CMK:[id number]  VWP:[id number]  DOB - 03/06/1956  Admit date - 12/29/2018  Admitting Physician Alyson Reedy, MD  Outpatient Primary MD for the patient is [name], MD  LOS - 6   Chief Complaint  Patient presents with  . Code Sepsis  . Abdominal Pain        Assessment & Plan    Patient seen briefly today due for discharge soon per Discharge done yesterday by Dr [name], no further issues, Vital signs stable, patient feels fine.    Note early in the morning patient was slightly drowsy and somnolent, low-dose Narcan 0.2 mg IV given after which she woke up and main symptom free, home dose narcotic dose reduced, patient counseled not to overuse narcotics for chronic pain.  Will be discharged as per discharge summary done by Dr. [name] yesterday.   Addendum.  Around 1 PM I was called by the nurse that patient is now febrile with a temp of 102, I think she might have silently aspirated versus developed a UTI.  Blood cultures urine cultures ordered, bladder scan, Foley if residual more than 300 cc, chest x-ray was stable this morning.  Reduce narcotics.  Unasyn started.   Medications  Scheduled Meds: . allopurinol  300 mg Oral Daily  . apixaban  2.5 mg Oral BID  . feeding supplement  1 Container Oral TID BM  . ipratropium-albuterol  3 mL Nebulization Q6H  . lipase/protease/amylase  36,000 Units Oral TID AC  . mouth rinse  15 mL Mouth Rinse BID  . pantoprazole  40 mg Oral BID  . polyethylene glycol  17 g Oral BID  . predniSONE  5 mg Oral Q breakfast  . senna  2 tablet Oral QHS  . sulfamethoxazole-trimethoprim  1 tablet Oral Q12H  . valproic acid  250 mg Oral TID    Continuous Infusions: . dextrose 5 % and 0.9% NaCl 20 mL/hr at 01/03/19 2218   PRN Meds:.acetaminophen, hydrOXYzine, morphine injection, naLOXone (NARCAN)  injection, simethicone    Time Spent in minutes   10 minutes   Susa Raring M.D on 01/04/2019 at 10:14 AM  Between 7am to 7pm - Pager - [phone]  After 7pm go to [email or web address] - password [email or web address]  And look for the night coverage person covering for me after hours  [facility] Office  [phone]    Subjective:   Shawnese Hurston today has, No headache, No chest pain, No abdominal  pain - No Nausea, No new weakness tingling or numbness, No Cough - SOB.    Objective:   Vitals:   01/03/19 0420 01/03/19 2005 01/04/19 0627 01/04/19 0935  BP: (!) 155/96 (!) 156/108 (!) 138/108   Pulse: 90 (!) 103 (!) 118   Resp: 20 (!) 22 20   Temp: 98.1 F (36.7 C) 98 F (36.7 C) 98.8 F (37.1 C)   TempSrc: Oral  Oral   SpO2: 98% 99%  98%  Weight:      Height:        Wt Readings from Last 3 Encounters:  12/29/18 50.1 kg  10/14/18 40.5 kg  10/01/18 46.2 kg     Intake/Output Summary (Last 24 hours) at 01/04/2019 1014 Last data filed at 01/04/2019 0517 Gross per 24 hour  Intake 400 ml  Output 501 ml  Net -101 ml    Exam Awake Alert,  No new F.N deficits, Normal affect [place].AT,PERRAL Supple Neck,No JVD, No cervical lymphadenopathy appriciated.  Symmetrical Chest wall movement, Good air movement bilaterally, few coarse B sounds RRR,No Gallops,Rubs or new Murmurs, No Parasternal Heave +ve B.Sounds, Abd Soft, Non tender, No organomegaly appriciated, No rebound - guarding or rigidity. No Cyanosis, Clubbing or edema, No new Rash or bruise      Data Review       Allergies as of 01/04/2019      Reactions   Ivp Dye [iodinated Diagnostic Agents] Itching   Metrizamide Itching      Medication List    STOP taking these medications   esomeprazole 40 MG capsule Commonly known as:  NEXIUM     TAKE these  medications   allopurinol 300 MG tablet Commonly known as:  ZYLOPRIM Take 300 mg by mouth daily.   apixaban 2.5 MG Tabs tablet Commonly known as:  ELIQUIS Take 2.5 mg by mouth 2 (two) times daily.   atorvastatin 10 MG tablet Commonly known as:  LIPITOR Take 10 mg by mouth daily at 6 PM.   colestipol 1 g tablet Commonly known as:  COLESTID Take 1 g by mouth 2 (two) times daily.   feeding supplement (ENSURE ENLIVE) Liqd Take 237 mLs by mouth 2 (two) times daily between meals.   furosemide 20 MG tablet Commonly known as:  LASIX Take 1 tablet (20 mg total) by mouth daily.   metoprolol succinate 25 MG 24 hr tablet Commonly known as:  TOPROL-XL Take 1 tablet (25 mg total) by mouth daily.   multivitamin with minerals Tabs tablet Take 1 tablet by mouth daily.   Oxycodone HCl 20 MG Tabs Take 0.5 tablets (10 mg total) by mouth 4 (four) times daily as needed for up to 5 days. What changed:    how much to take  when to take this  reasons to take this   Pancrelipase (Lip-Prot-Amyl) 24000-76000 units Cpep Take 3 capsules by mouth 3 (three) times daily.   pantoprazole 40 MG tablet Commonly known as:  PROTONIX Take 40 mg by mouth 2 (two) times daily.   polyethylene glycol packet Commonly known as:  MIRALAX / GLYCOLAX Take 17 g by mouth daily.   potassium chloride SA 20 MEQ tablet Commonly known as:  K-DUR,KLOR-CON Take 1 tablet (20 mEq total) by mouth daily.   predniSONE 5 MG tablet Commonly known as:  DELTASONE Take 1 tablet (5 mg total) by mouth daily with breakfast. Restart after prednisone taper. What changed:  when to take this   senna 8.6 MG Tabs tablet Commonly known as:  SENOKOT Take 2 tablets (17.2 mg total) by mouth at bedtime.   sulfamethoxazole-trimethoprim 800-160 MG tablet Commonly known as:  BACTRIM DS,SEPTRA DS Take 1 tablet by mouth every 12 (twelve) hours.   valproic acid 250 MG capsule Commonly known as:  DEPAKENE Take 500 mg by mouth 3  (three) times daily.

## 2019-01-05 ENCOUNTER — Inpatient Hospital Stay (HOSPITAL_COMMUNITY): Payer: Medicaid Other

## 2019-01-05 DIAGNOSIS — Z515 Encounter for palliative care: Secondary | ICD-10-CM

## 2019-01-05 DIAGNOSIS — J111 Influenza due to unidentified influenza virus with other respiratory manifestations: Secondary | ICD-10-CM

## 2019-01-05 LAB — BASIC METABOLIC PANEL
Anion gap: 8 (ref 5–15)
BUN: <5 mg/dL — ABNORMAL LOW (ref 6–20)
CO2: 21 mmol/L — ABNORMAL LOW (ref 22–32)
Calcium: 7.4 mg/dL — ABNORMAL LOW (ref 8.9–10.3)
Chloride: 113 mmol/L — ABNORMAL HIGH (ref 98–111)
Creatinine, Ser: 0.78 mg/dL (ref 0.44–1.00)
GFR calc Af Amer: >60 mL/min (ref >60)
GFR calc non Af Amer: >60 mL/min (ref >60)
GLUCOSE: 100 mg/dL — AB (ref 70–99)
Potassium: 3.6 mmol/L (ref 3.5–5.1)
Sodium: 142 mmol/L (ref 135–145)

## 2019-01-05 LAB — CBC
HCT: 23.2 % — ABNORMAL LOW (ref 36.0–46.0)
Hemoglobin: 7.7 g/dL — ABNORMAL LOW (ref 12.0–15.0)
MCH: 26.4 pg (ref 26.0–34.0)
MCHC: 33.2 g/dL (ref 30.0–36.0)
MCV: 79.5 fL — ABNORMAL LOW (ref 80.0–100.0)
Platelets: 88 10*3/uL — ABNORMAL LOW (ref 150–400)
RBC: 2.92 MIL/uL — ABNORMAL LOW (ref 3.87–5.11)
RDW: 17.3 % — ABNORMAL HIGH (ref 11.5–15.5)
WBC: 6.4 10*3/uL (ref 4.0–10.5)
nRBC: 0.3 % — ABNORMAL HIGH (ref 0.0–0.2)

## 2019-01-05 LAB — BRAIN NATRIURETIC PEPTIDE: B Natriuretic Peptide: 984.3 pg/mL — ABNORMAL HIGH (ref 0.0–100.0)

## 2019-01-05 MED ORDER — METHYLPREDNISOLONE SODIUM SUCC 125 MG IJ SOLR
60.0000 mg | Freq: Once | INTRAMUSCULAR | Status: AC
  Administered 2019-01-05: 60 mg via INTRAVENOUS
  Filled 2019-01-05: qty 2

## 2019-01-05 MED ORDER — IPRATROPIUM-ALBUTEROL 0.5-2.5 (3) MG/3ML IN SOLN
3.0000 mL | Freq: Three times a day (TID) | RESPIRATORY_TRACT | Status: DC | PRN
  Administered 2019-01-05 – 2019-01-15 (×9): 3 mL via RESPIRATORY_TRACT
  Filled 2019-01-05 (×10): qty 3

## 2019-01-05 MED ORDER — FUROSEMIDE 10 MG/ML IJ SOLN
40.0000 mg | Freq: Once | INTRAMUSCULAR | Status: AC
  Administered 2019-01-05: 40 mg via INTRAVENOUS
  Filled 2019-01-05: qty 4

## 2019-01-05 MED ORDER — OSELTAMIVIR PHOSPHATE 30 MG PO CAPS
30.0000 mg | ORAL_CAPSULE | Freq: Two times a day (BID) | ORAL | Status: AC
  Administered 2019-01-05 – 2019-01-09 (×10): 30 mg via ORAL
  Filled 2019-01-05 (×10): qty 1

## 2019-01-05 MED ORDER — POTASSIUM CHLORIDE CRYS ER 20 MEQ PO TBCR
40.0000 meq | EXTENDED_RELEASE_TABLET | Freq: Once | ORAL | Status: AC
  Administered 2019-01-05: 40 meq via ORAL
  Filled 2019-01-05: qty 2

## 2019-01-05 MED ORDER — OXYCODONE HCL 5 MG PO TABS
5.0000 mg | ORAL_TABLET | Freq: Four times a day (QID) | ORAL | Status: DC | PRN
  Administered 2019-01-05 – 2019-01-08 (×12): 5 mg via ORAL
  Filled 2019-01-05 (×13): qty 1

## 2019-01-05 MED ORDER — IPRATROPIUM-ALBUTEROL 0.5-2.5 (3) MG/3ML IN SOLN: 3.0000 mL | Freq: Three times a day (TID) | RESPIRATORY_TRACT | Status: DC

## 2019-01-05 MED ORDER — IBUPROFEN 400 MG PO TABS: 400.0000 mg | ORAL_TABLET | Freq: Four times a day (QID) | ORAL | Status: DC | PRN

## 2019-01-05 NOTE — Progress Notes (Signed)
PROGRESS NOTE        PATIENT DETAILS Name: Amber [name] Age: 63 y.o. Sex: female Date of Birth: [birth year] Admit Date: 12/29/2018 Admitting Physician Rush Farmer, MD PCP:Sun, Mikeal Hawthorne, MD  Brief Narrative: Patient is a 63 y.o. female with history of chronic diastolic heart failure, PAF on anticoagulation, embolic CVA,  seizure disorder, rheumatoid arthritis, prior back back surgeries, chronic pain syndrome-presented to the ED for hypotension, was found to have septic shock secondary to gram-negative bacteremia.  See below for further details  Subjective: Seen in bed, complains of some shortness of breath and orthopnea, still has a cough, no chest pain or abdominal pain.  Does have chronic diffuse generalized pains and aches.  Assessment/Plan:  Septic shock secondary to E. coli bacteremia: Sepsis pathophysiology has resolved, continue Rocephin thereafter oral antibiotic transition once she goes home.  Cough-shortness of breath and orthopnea.  Combination of hospital-acquired influenza infection.  Placed on Tamiflu, proBNP elevated, stop IV fluids, trial of IV Lasix and monitor.  Flutter valve added for pulmonary toiletry along with nebulizer treatments and oxygen for supportive care.  Acute on chronic diastolic CHF.  EF 65% on echocardiogram in 2019.  Plan as above.    Abdominal pain: Much improved-she had numerous BMs overnight after she was started on Dulcolax x1, senna/MiraLAX.  She has chronic abdominal pain at baseline from chronic pancreatitis.    Lactic acidosis: Secondary to sepsis, has normalized.  Elevated troponin: Trend is flat-not consistent with ACS-likely demand ischemia.  EKG without acute changes.  Given poor overall health-bedbound status-best served by conservative management.  PAF: Sinus rhythm-has prior history of embolic stroke-continue Eliquis.  Chronic thrombocytopenia: Unclear etiology-platelets close to usual baseline.   Follow  Anemia: Suspect has chronic anemia at baseline-worsened by acute illness, has required 1 unit of PRBC transfusion during this hospital stay.  Hemoglobin low but stable-follow.  History of rheumatoid arthritis with chronic steroid use: Appears to be on chronic prednisone-currently on stress dose IV Solu-Cortef-we will slowly taper over the next few days.  Chronic pancreatitis: Continue Creon  History of seizures: Continue Depakote  History of CVA: Reviewed records in care everywhere-she has had prior embolic strokes-difficult neurologic exam due to lethargy/weakness-but appears nonfocal-on Eliquis  Generalized deconditioning/worsening debility: Per patient-she has not ambulated in 4 years-mostly bed to wheelchair bound.  She is going to talk to her family about SNF rehab-as per social work-her family wants to take her home.    Chronic pain syndrome (back pain, neck pain, abdominal pain): Continue prn oxycodone-await x-ray abdomen-may need to be started on a bowel regimen.  Monitor on low-dose narcotics with caution.  Has tendency to accidentally overdose.     DVT Prophylaxis: Full dose anticoagulation with Eliquis  Code Status: Full code  Family Communication: None at bedside  Disposition Plan: Remain inpatient-hopefully discharge tomorrow if clinical improvement continues.  Antimicrobial agents: Anti-infectives (From admission, onward)   Start     Dose/Rate Route Frequency Ordered Stop   01/05/19 1000  oseltamivir (TAMIFLU) capsule 30 mg     30 mg Oral 2 times daily 01/05/19 0826 01/10/19 0959   01/04/19 1500  cefTRIAXone (ROCEPHIN) 2 g in sodium chloride 0.9 % 100 mL IVPB     2 g 200 mL/hr over 30 Minutes Intravenous Every 24 hours 01/04/19 1416     01/04/19 1500  metroNIDAZOLE (FLAGYL) IVPB 500  mg     500 mg 100 mL/hr over 60 Minutes Intravenous Every 8 hours 01/04/19 1416     01/03/19 1030  sulfamethoxazole-trimethoprim (BACTRIM DS,SEPTRA DS) 800-160 MG per tablet  1 tablet  Status:  Discontinued     1 tablet Oral Every 12 hours 01/03/19 1019 01/04/19 1416   01/03/19 0000  sulfamethoxazole-trimethoprim (BACTRIM DS,SEPTRA DS) 800-160 MG tablet     1 tablet Oral Every 12 hours 01/03/19 1059     12/31/18 1330  vancomycin (VANCOCIN) IVPB 750 mg/150 ml premix  Status:  Discontinued     750 mg 150 mL/hr over 60 Minutes Intravenous Every 48 hours 12/29/18 1738 12/30/18 1127   12/30/18 1200  cefTRIAXone (ROCEPHIN) 2 g in sodium chloride 0.9 % 100 mL IVPB  Status:  Discontinued     2 g 200 mL/hr over 30 Minutes Intravenous Every 24 hours 12/30/18 1127 01/03/19 1019   12/30/18 0130  ceFEPIme (MAXIPIME) 1 g in sodium chloride 0.9 % 100 mL IVPB  Status:  Discontinued     1 g 200 mL/hr over 30 Minutes Intravenous Every 12 hours 12/29/18 1750 12/30/18 1127   12/29/18 1715  ceFEPIme (MAXIPIME) 2 g in sodium chloride 0.9 % 100 mL IVPB  Status:  Discontinued     2 g 200 mL/hr over 30 Minutes Intravenous  Once 12/29/18 1706 12/29/18 1750   12/29/18 1715  metroNIDAZOLE (FLAGYL) IVPB 500 mg  Status:  Discontinued     500 mg 100 mL/hr over 60 Minutes Intravenous Every 8 hours 12/29/18 1706 12/30/18 1127   12/29/18 1715  vancomycin (VANCOCIN) IVPB 1000 mg/200 mL premix  Status:  Discontinued     1,000 mg 200 mL/hr over 60 Minutes Intravenous  Once 12/29/18 1706 12/29/18 1750   12/29/18 1230  ceFEPIme (MAXIPIME) 2 g in sodium chloride 0.9 % 100 mL IVPB     2 g 200 mL/hr over 30 Minutes Intravenous  Once 12/29/18 1226 12/29/18 1424   12/29/18 1230  metroNIDAZOLE (FLAGYL) IVPB 500 mg  Status:  Discontinued     500 mg 100 mL/hr over 60 Minutes Intravenous Every 8 hours 12/29/18 1226 12/29/18 1706   12/29/18 1230  vancomycin (VANCOCIN) IVPB 1000 mg/200 mL premix     1,000 mg 200 mL/hr over 60 Minutes Intravenous  Once 12/29/18 1226 12/29/18 1551      Procedures: None  CONSULTS:  None  Time spent: 25- minutes-Greater than 50% of this time was spent in counseling,  explanation of diagnosis, planning of further management, and coordination of care.  MEDICATIONS: Scheduled Meds: . allopurinol  300 mg Oral Daily  . apixaban  2.5 mg Oral BID  . feeding supplement  1 Container Oral TID BM  . furosemide  40 mg Intravenous Once  . lipase/protease/amylase  36,000 Units Oral TID AC  . mouth rinse  15 mL Mouth Rinse BID  . methylPREDNISolone (SOLU-MEDROL) injection  60 mg Intravenous Once  . metoprolol succinate  25 mg Oral Daily  . oseltamivir  30 mg Oral BID  . pantoprazole  40 mg Oral BID  . polyethylene glycol  17 g Oral BID  . potassium chloride  40 mEq Oral Once  . predniSONE  5 mg Oral Q breakfast  . senna  2 tablet Oral QHS  . valproic acid  250 mg Oral TID   Continuous Infusions: . cefTRIAXone (ROCEPHIN)  IV 2 g (01/04/19 1635)  . metronidazole 500 mg (01/05/19 0558)   PRN Meds:.acetaminophen, diltiazem, hydrOXYzine, ipratropium-albuterol, naLOXone (NARCAN)  injection, oxyCODONE, simethicone   PHYSICAL EXAM: Vital signs: Vitals:   01/05/19 0500 01/05/19 0700 01/05/19 0802 01/05/19 1050  BP: 119/80 (!) 125/99    Pulse: 98     Resp: (!) 23     Temp: 98 F (36.7 C) 98.4 F (36.9 C) (!) 97.4 F (36.3 C)   TempSrc: Axillary Axillary    SpO2: 100%   93%  Weight:      Height:       Filed Weights   12/29/18 1229 12/29/18 2040  Weight: 40.5 kg 50.1 kg   Body mass index is 21.57 kg/m.   Exam  Awake Alert, Oriented X 3, No new F.N deficits, overall extremely frail and cachectic African-American female who appears much older than her stated age Becker.AT,PERRAL Supple Neck,No JVD, No cervical lymphadenopathy appriciated.  Symmetrical Chest wall movement, Good air movement bilaterally, bilateral coarse breath sounds along with rails RRR,No Gallops, Rubs or new Murmurs, No Parasternal Heave +ve B.Sounds, Abd Soft, No tenderness, No organomegaly appriciated, No rebound - guarding or rigidity. No Cyanosis, Clubbing or edema, No new Rash or  bruise   I have personally reviewed following labs and imaging studies  LABORATORY DATA: CBC: Recent Labs  Lab 12/29/18 1223  12/30/18 2204 12/31/18 0405 01/01/19 0953 01/02/19 0700 01/05/19 0619  WBC 22.4*   < > 23.1* 21.6* 19.8* 15.4* 6.4  NEUTROABS 15.0*  --  19.9*  --   --   --   --   HGB 7.2*   < > 8.5* 8.1* 9.5* 9.1* 7.7*  HCT 22.6*   < > 26.0* 24.3* 28.5* 25.7* 23.2*  MCV 83.1   < > 81.3 80.7 79.4* 77.6* 79.5*  PLT PLATELET CLUMPS NOTED ON SMEAR, UNABLE TO ESTIMATE   < > 72* 72* 69* 62* 88*   < > = values in this interval not displayed.    Basic Metabolic Panel: Recent Labs  Lab 12/29/18 1223  12/31/18 0405 01/01/19 0953  01/03/19 0455 01/03/19 1124 01/03/19 1501 01/04/19 0057 01/04/19 0407 01/05/19 0619  NA 140   < > 141 144   < > 143 140 142 141 142 142  K 2.7*   < > 4.7 4.1   < > 2.7* 2.7* 2.3* 3.7 4.5 3.6  CL 119*   < > 120* 119*   < > 111 107 107 108 112* 113*  CO2 15*   < > 14* 17*   < > 21* 21* '24 23 22 '$ 21*  GLUCOSE 66*   < > 79 136*   < > 121* 219* 170* 111* 76 100*  BUN 11   < > 11 11   < > 6 5* 5* 5* 6 <5*  CREATININE 1.39*   < > 1.02* 0.95   < > 0.87 0.87 0.84 0.78 0.81 0.78  CALCIUM 7.5*   < > 8.0* 7.9*   < > 7.3* 7.3* 7.3* 7.4* 7.4* 7.4*  MG 2.3  --  1.9 1.9  --  1.6*  --   --   --  2.1  --   PHOS 3.3  --  3.9 3.2  --   --   --   --   --   --   --    < > = values in this interval not displayed.    GFR: Estimated Creatinine Clearance: 53.7 mL/min (by C-G formula based on SCr of 0.78 mg/dL).  Liver Function Tests: Recent Labs  Lab 12/29/18 1223 12/31/18 0405 01/01/19 7371  AST 66* 40 41  ALT '24 21 24  '$ ALKPHOS 212* 154* 171*  BILITOT 1.9* 0.6 0.6  PROT 4.5* 4.6* 5.0*  ALBUMIN 1.7* 1.7* 1.8*   Recent Labs  Lab 12/29/18 1223 12/30/18 1155 12/30/18 2204  LIPASE '27 21 22  '$ AMYLASE  --   --  20*   Recent Labs  Lab 01/01/19 0953  AMMONIA 53*    Coagulation Profile: Recent Labs  Lab 12/29/18 1223  INR 1.98    Cardiac  Enzymes: Recent Labs  Lab 12/31/18 0405 12/31/18 1043 12/31/18 1632 01/01/19 0953  TROPONINI 0.22* 0.23* 0.20* 0.34*    BNP (last 3 results) No results for input(s): PROBNP in the last 8760 hours.  HbA1C: No results for input(s): HGBA1C in the last 72 hours.  CBG: Recent Labs  Lab 12/31/18 1536 01/01/19 0015 01/01/19 0349 01/01/19 0809 01/01/19 1256  GLUCAP 85 144* 146* 126* 159*    Lipid Profile: No results for input(s): CHOL, HDL, LDLCALC, TRIG, CHOLHDL, LDLDIRECT in the last 72 hours.  Thyroid Function Tests: No results for input(s): TSH, T4TOTAL, FREET4, T3FREE, THYROIDAB in the last 72 hours.  Anemia Panel: No results for input(s): VITAMINB12, FOLATE, FERRITIN, TIBC, IRON, RETICCTPCT in the last 72 hours.  Urine analysis:    Component Value Date/Time   COLORURINE YELLOW 01/04/2019 2048   APPEARANCEUR HAZY (A) 01/04/2019 2048   LABSPEC 1.015 01/04/2019 2048   PHURINE 7.0 01/04/2019 2048   GLUCOSEU NEGATIVE 01/04/2019 2048   HGBUR SMALL (A) 01/04/2019 2048   BILIRUBINUR NEGATIVE 01/04/2019 2048   KETONESUR NEGATIVE 01/04/2019 2048   PROTEINUR 30 (A) 01/04/2019 2048   UROBILINOGEN 1.0 07/27/2015 1052   NITRITE NEGATIVE 01/04/2019 2048   LEUKOCYTESUR NEGATIVE 01/04/2019 2048    Sepsis Labs: Lactic Acid, Venous    Component Value Date/Time   LATICACIDVEN 1.6 01/01/2019 0953    MICROBIOLOGY: Recent Results (from the past 240 hour(s))  Blood Culture (routine x 2)     Status: None   Collection Time: 12/29/18 12:23 PM  Result Value Ref Range Status   Specimen Description BLOOD RIGHT FOREARM  Final   Special Requests   Final    BOTTLES DRAWN AEROBIC AND ANAEROBIC Blood Culture adequate volume Performed at [facility], [place] [address], [place], [place] [address]    Culture   Final    NO GROWTH 5 DAYS Performed at [facility], [place] [address], [place], [place] [address]    Report Status 01/03/2019 FINAL  Final  Urine culture      Status: Abnormal   Collection Time: 12/29/18 12:24 PM  Result Value Ref Range Status   Specimen Description   Final    URINE, RANDOM Performed at [place] [address] [place], [place] [address]    Special Requests   Final    NONE Performed at [facility], [place] [address] [place], [place] [address]    Culture MULTIPLE SPECIES PRESENT, SUGGEST RECOLLECTION (A)  Final   Report Status 12/31/2018 FINAL  Final  Blood Culture (routine x 2)     Status: Abnormal   Collection Time: 12/29/18 12:28 PM  Result Value Ref Range Status   Specimen Description BLOOD RIGHT ARM  Final   Special Requests   Final    BOTTLES DRAWN AEROBIC AND ANAEROBIC Blood Culture adequate volume Performed at [facility] [address] [place], [place] [address]    Culture  Setup Time   Final  GRAM NEGATIVE RODS ANAEROBIC BOTTLE ONLY Organism ID to follow CRITICAL RESULT CALLED TO, READ BACK BY AND VERIFIED WITH: Jene Every PharmD 9:45 12/30/18 ([email or web address]) Performed at [facility], [place] [address], [place], [place] [address]    Culture ESCHERICHIA COLI (A)  Final   Report Status 01/01/2019 FINAL  Final   Organism ID, Bacteria ESCHERICHIA COLI  Final      Susceptibility   Escherichia coli - MIC*    AMPICILLIN >=32 RESISTANT Resistant     CEFAZOLIN 32 INTERMEDIATE Intermediate     CEFEPIME <=1 SENSITIVE Sensitive     CEFTAZIDIME <=1 SENSITIVE Sensitive     CEFTRIAXONE <=1 SENSITIVE Sensitive     CIPROFLOXACIN >=4 RESISTANT Resistant     GENTAMICIN <=1 SENSITIVE Sensitive     IMIPENEM <=0.25 SENSITIVE Sensitive     TRIMETH/SULFA <=20 SENSITIVE Sensitive     AMPICILLIN/SULBACTAM >=32 RESISTANT Resistant     PIP/TAZO >=128 RESISTANT Resistant     Extended ESBL NEGATIVE Sensitive     * ESCHERICHIA COLI  Blood Culture ID Panel (Reflexed)     Status: Abnormal   Collection Time: 12/29/18 12:28 PM  Result Value Ref Range Status    Enterococcus species NOT DETECTED NOT DETECTED Final   Listeria monocytogenes NOT DETECTED NOT DETECTED Final   Staphylococcus species NOT DETECTED NOT DETECTED Final   Staphylococcus aureus (BCID) NOT DETECTED NOT DETECTED Final   Streptococcus species NOT DETECTED NOT DETECTED Final   Streptococcus agalactiae NOT DETECTED NOT DETECTED Final   Streptococcus pneumoniae NOT DETECTED NOT DETECTED Final   Streptococcus pyogenes NOT DETECTED NOT DETECTED Final   Acinetobacter baumannii NOT DETECTED NOT DETECTED Final   Enterobacteriaceae species DETECTED (A) NOT DETECTED Final    Comment: Enterobacteriaceae represent a large family of gram-negative bacteria, not a single organism. CRITICAL RESULT CALLED TO, READ BACK BY AND VERIFIED WITH: Jene Every PharmD 9:45 12/30/18 ([email or web address])    Enterobacter cloacae complex NOT DETECTED NOT DETECTED Final   Escherichia coli DETECTED (A) NOT DETECTED Final    Comment: CRITICAL RESULT CALLED TO, READ BACK BY AND VERIFIED WITH: Jene Every PharmD 9:45 12/30/18 ([email or web address])    Klebsiella oxytoca NOT DETECTED NOT DETECTED Final   Klebsiella pneumoniae NOT DETECTED NOT DETECTED Final   Proteus species NOT DETECTED NOT DETECTED Final   Serratia marcescens NOT DETECTED NOT DETECTED Final   Carbapenem resistance NOT DETECTED NOT DETECTED Final   Haemophilus influenzae NOT DETECTED NOT DETECTED Final   Neisseria meningitidis NOT DETECTED NOT DETECTED Final   Pseudomonas aeruginosa NOT DETECTED NOT DETECTED Final   Candida albicans NOT DETECTED NOT DETECTED Final   Candida glabrata NOT DETECTED NOT DETECTED Final   Candida krusei NOT DETECTED NOT DETECTED Final   Candida parapsilosis NOT DETECTED NOT DETECTED Final   Candida tropicalis NOT DETECTED NOT DETECTED Final    Comment: Performed at [facility], [place] [address] [place], [place] [address]  MRSA PCR Screening     Status: None   Collection Time: 12/29/18  8:36 PM  Result Value Ref Range Status   MRSA by PCR  NEGATIVE NEGATIVE Final    Comment:        The GeneXpert MRSA Assay (FDA approved for NASAL specimens only), is one component of a comprehensive MRSA colonization surveillance program. It is not intended to diagnose MRSA infection nor to guide or monitor treatment for MRSA infections. Performed at [facility], [place] [address], [place], [place] [address]   Gastrointestinal  Panel by PCR , Stool     Status: None   Collection Time: 12/30/18 10:17 PM  Result Value Ref Range Status   Campylobacter species NOT DETECTED NOT DETECTED Final   Plesimonas shigelloides NOT DETECTED NOT DETECTED Final   Salmonella species NOT DETECTED NOT DETECTED Final   Yersinia enterocolitica NOT DETECTED NOT DETECTED Final   Vibrio species NOT DETECTED NOT DETECTED Final   Vibrio cholerae NOT DETECTED NOT DETECTED Final   Enteroaggregative E coli (EAEC) NOT DETECTED NOT DETECTED Final   Enteropathogenic E coli (EPEC) NOT DETECTED NOT DETECTED Final   Enterotoxigenic E coli (ETEC) NOT DETECTED NOT DETECTED Final   Shiga like toxin producing E coli (STEC) NOT DETECTED NOT DETECTED Final   Shigella/Enteroinvasive E coli (EIEC) NOT DETECTED NOT DETECTED Final   Cryptosporidium NOT DETECTED NOT DETECTED Final   Cyclospora cayetanensis NOT DETECTED NOT DETECTED Final   Entamoeba histolytica NOT DETECTED NOT DETECTED Final   Giardia lamblia NOT DETECTED NOT DETECTED Final   Adenovirus F40/41 NOT DETECTED NOT DETECTED Final   Astrovirus NOT DETECTED NOT DETECTED Final   Norovirus GI/GII NOT DETECTED NOT DETECTED Final   Rotavirus A NOT DETECTED NOT DETECTED Final   Sapovirus (I, II, IV, and V) NOT DETECTED NOT DETECTED Final    Comment: Performed at [facility], [place] [place], [place] [address]  Stool culture (children & immunocomp patients)     Status: None   Collection Time: 12/30/18 10:17 PM  Result Value Ref Range Status   Salmonella/Shigella Screen Final report  Final    Campylobacter Culture Final report  Final   E coli, Shiga toxin Assay Negative Negative Final    Comment: (NOTE) Performed At: [facility] [address] [place], [place] [id number] Rush Farmer MD DX:[id number]   STOOL CULTURE REFLEX - RSASHR     Status: None   Collection Time: 12/30/18 10:17 PM  Result Value Ref Range Status   Stool Culture result 1 (RSASHR) Comment  Final    Comment: (NOTE) No Salmonella or Shigella recovered. Performed At: [facility] [address] [place], [place] [id number] Rush Farmer MD EZ:[id number]   STOOL CULTURE Reflex - CMPCXR     Status: None   Collection Time: 12/30/18 10:17 PM  Result Value Ref Range Status   Stool Culture result 1 (CMPCXR) Comment  Final    Comment: (NOTE) No Campylobacter species isolated. Performed At: [facility] [place], [place] [id number] Rush Farmer MD LE:[id number]   Culture, blood (routine x 2)     Status: None (Preliminary result)   Collection Time: 01/04/19  6:38 PM  Result Value Ref Range Status   Specimen Description BLOOD LEFT HAND  Final   Special Requests   Final    BOTTLES DRAWN AEROBIC ONLY Blood Culture adequate volume   Culture   Final    NO GROWTH < 12 HOURS Performed at [facility], [address], [place],  [address]    Report Status PENDING  Incomplete  Culture, blood (routine x 2)     Status: None (Preliminary result)   Collection Time: 01/04/19  6:38 PM  Result Value Ref Range Status   Specimen Description BLOOD RIGHT HAND  Final   Special Requests   Final    BOTTLES DRAWN AEROBIC ONLY Blood Culture results may not be optimal due to an excessive volume of blood received in culture bottles   Culture   Final    NO GROWTH <  12 HOURS Performed at [facility], [place] [address] [place], [place] [address]    Report Status PENDING  Incomplete    RADIOLOGY STUDIES/RESULTS: Ct Abdomen Pelvis Wo Contrast  Result Date:  12/29/2018 CLINICAL DATA:  Abdomen distension two days ago here for food poisoning Allergic to iv contrast no oral contrast per MD EXAM: CT ABDOMEN AND PELVIS WITHOUT CONTRAST TECHNIQUE: Multidetector CT imaging of the abdomen and pelvis was performed following the standard protocol without IV contrast. COMPARISON:  12/27/2018 FINDINGS: Lower chest: There is bibasilar atelectasis. Hepatobiliary: The liver is homogeneous. Status post cholecystectomy. Pancreas: Pancreas is atrophic. Coarse calcifications throughout the pancreatic body and tail suggests history of chronic pancreatitis. Spleen: Normal in size without focal abnormality. Adrenals/Urinary Tract: Adrenal glands are unremarkable. Kidneys are normal, without renal calculi, focal lesion, or hydronephrosis. Bladder is unremarkable. Stomach/Bowel: Stomach is unremarkable. Small bowel loops are normal in appearance. There are numerous colonic diverticula. No acute diverticulitis. Significant stool within the rectosigmoid colon. There has been some improvement in the appearance of thickening of the rectosigmoid colon. No perirectal abscess. Vascular/Lymphatic: There is dense atherosclerotic calcification of the abdominal aorta not associated with aneurysm. No retroperitoneal or mesenteric adenopathy. Reproductive: The uterus is present. Multiple mural calcifications are present. No adnexal mass. Other: No ascites. No abdominal wall hernia. Note is made of fat within the LEFT inguinal ring, likely a lipoma. Diffuse body wall edema. Musculoskeletal: Remote wedge compression fractures of T12, L1, and L3. There is 2 millimeters anterolisthesis of L4 on L5. Degenerative changes are identified at L4-5 and L5-S1. No suspicious lytic or blastic lesions are identified. IMPRESSION: 1. Significant stool within the rectosigmoid colon. There has been some improvement in the appearance of thickening of the rectosigmoid colon. 2. Diverticulosis without acute diverticulitis. 3.  Status post cholecystectomy. 4. Changes of chronic pancreatitis. 5. Remote wedge compression fractures of T12, L1, and L3. Aortic Atherosclerosis (ICD10-I70.0). Electronically Signed   By: Nolon Nations M.D.   On: 12/29/2018 13:12   Dg Chest Port 1 View  Result Date: 01/05/2019 CLINICAL DATA:  Five day history of shortness of breath. Acute mid chest pain that began today. EXAM: PORTABLE CHEST 1 VIEW COMPARISON:  01/04/2019 and earlier. FINDINGS: Cardiac silhouette moderately enlarged, unchanged. Increasing interstitial opacities superimposed upon the previously identified interstitial lung disease. Stable small BILATERAL pleural effusions. Prior thoracic spine corpectomy and fusion. Lytic lesion in the proximal RIGHT humerus. Severe degenerative changes involving both shoulders. IMPRESSION: 1. Developing interstitial pulmonary edema is suspected, superimposed upon the previously identified interstitial lung disease. 2. Stable small BILATERAL pleural effusions. 3. Lytic lesion involving the proximal RIGHT humerus. Does the patient have a known malignancy or multiple myeloma? Electronically Signed   By: Evangeline Dakin M.D.   On: 01/05/2019 08:58   Dg Chest Port 1 View  Result Date: 01/04/2019 CLINICAL DATA:  Difficulty breathing EXAM: PORTABLE CHEST 1 VIEW COMPARISON:  December 29, 2018; November 13, 2018 FINDINGS: Diffuse interstitial thickening remains stable. There is no frank edema consolidation. Heart is upper normal in size with pulmonary vascularity normal. No adenopathy. Extensive postoperative change in the thoracic spine noted. Evidence of old right-sided rib trauma laterally. There is arthropathy in the shoulders, stable. Cystic area in the proximal right humerus is stable. IMPRESSION: Diffuse interstitial thickening, likely reflecting chronic fibrosis. No frank edema or consolidation evident. Stable cardiac silhouette. Extensive postoperative change in the thoracic spine. Advanced arthropathy  in the shoulders. Prior rib trauma on the right. Electronically Signed   By:  Lowella Grip III M.D.   On: 01/04/2019 08:51   Dg Chest Port 1 View  Result Date: 12/29/2018 CLINICAL DATA:  63 year old female with altered mental status, fever, decreased p.o. intake. EXAM: PORTABLE CHEST 1 VIEW COMPARISON:  Portable chest 11/13/2018 and earlier. FINDINGS: Portable AP semi upright view at 1320 hours. Sequelae of previous thoracic spine corpectomy and posterior fusion. Hardware appears stable. Stable visualized osseous structures. Stable lung volumes and mediastinal contours. Visualized tracheal air column is within normal limits. Chronic increased pulmonary interstitial markings appear stable since 2019. No pneumothorax, pleural effusion or acute pulmonary opacity. Mildly increased bowel gas in the abdomen. Stable cholecystectomy clips. IMPRESSION: Stable.  No acute cardiopulmonary abnormality. Electronically Signed   By: Genevie Ann M.D.   On: 12/29/2018 14:03   Dg Abd Portable 1v  Result Date: 12/30/2018 CLINICAL DATA:  Abdominal pain EXAM: PORTABLE ABDOMEN - 1 VIEW COMPARISON:  CT 12/29/2018 FINDINGS: Surgical hardware in the lower thoracic spine. Dense aortic atherosclerosis. Mild diffuse increased bowel gas without obstructive pattern. Possible bowel wall thickening at the hepatic flexure and descending colon. No radiopaque calculi. IMPRESSION: Diffuse increased bowel gas without obstructive pattern. There may be bowel wall edema at the hepatic flexure and descending colon as may be seen with colitis Electronically Signed   By: Donavan Foil M.D.   On: 12/30/2018 21:45   Dg Abd Portable 2v  Result Date: 01/01/2019 CLINICAL DATA:  Vomiting. EXAM: PORTABLE ABDOMEN - 2 VIEW COMPARISON:  Radiograph of December 30, 2018. FINDINGS: No small bowel dilatation is noted. Mildly dilated colon loop is seen in right side of abdomen which most likely represents ileus. Status post cholecystectomy. There is no evidence  of free air. No radio-opaque calculi or other significant radiographic abnormality is seen. IMPRESSION: Mildly dilated colonic loops seen in right side of abdomen which may represent ileus. Electronically Signed   By: Marijo Conception, M.D.   On: 01/01/2019 14:50     LOS: 7 days   Signature  Lala Lund M.D on 01/05/2019 at 11:25 AM   -  To page go to [email or web address]

## 2019-01-05 NOTE — Consult Note (Signed)
Palliative Medicine    Name: Amber [name] Date: 01/05/2019 MRN: [id number]  DOB: 01/07/1956  Patient Care Team: Sandi Mariscal, MD as PCP - General (Internal Medicine)    REASON FOR CONSULTATION: Palliative Care consult requested for this 63 y.o. female with multiple medical problems including chronic atrial fibrillation not on anticoagulation due to history of GI bleed, chronic pain with severe degeneration to the right shoulder, history of CVA, seizure disorder, diastolic dysfunction and CAD.  Patient was hospitalized here 10/10/2018 to 10/14/2018 with sepsis from multifocal pneumonia and possible aspiration.  She was hospitalized again recently at [facility] with food poisoning in February and discharged 12/28/2018.  Patient is now readmitted 12/29/2018 with sepsis and E. coli bacteremia, presumably related to her recent gastroenteritis.  Patient was pending discharge on 01/03/2019 but developed somnolence from opioids, which was responsive to Narcan.  Patient then became febrile and was positive for influenza B by PCR.  Palliative care was consulted to help address goals.  SOCIAL HISTORY:    Patient is widowed.  She lives at home with her son, daughter, and granddaughter.  She has 3 sons and 3 daughters.  Patient formally worked in [employer].  ADVANCE DIRECTIVES:  Does not have  CODE STATUS: Full code  PAST MEDICAL HISTORY: Past Medical History:  Diagnosis Date  . ACS (acute coronary syndrome) ([place]) 06/12/2015  . Acute on chronic blood loss anemia 11/25/2017  . Arthritis   . Asthma   . Cerebrovascular accident (CVA) due to embolism ([place]) 10/26/2016  . Chronic pain   . Constipation   . Diabetes mellitus without complication ([place])   . Duodenitis   . Embolic stroke ([place])   . Falls frequently   . Focal seizure ([place])   . GERD (gastroesophageal reflux disease)   . Hypertension   . Impacted fracture 02/15/2017   right glenoid  . Pneumonia 10/2015  .  Seizures ([place])    last seizure March 2015  . Small bowel obstruction ([place]) 11/25/2017    PAST SURGICAL HISTORY:  Past Surgical History:  Procedure Laterality Date  . APPENDECTOMY    . BACK SURGERY    . CHOLECYSTECTOMY    . TEE WITHOUT CARDIOVERSION N/A 10/30/2016   Procedure: TRANSESOPHAGEAL ECHOCARDIOGRAM (TEE);  Surgeon: Dorothy Spark, MD;  Location: [facility] ENDOSCOPY;  Service: Cardiovascular;  Laterality: N/A;    HEMATOLOGY/ONCOLOGY HISTORY:   No history exists.    ALLERGIES:  is allergic to ivp dye [iodinated diagnostic agents] and metrizamide.  MEDICATIONS:  Current Facility-Administered Medications  Medication Dose Route Frequency Provider Last Rate Last Dose  . acetaminophen (TYLENOL) tablet 650 mg  650 mg Oral Q6H PRN Thurnell Lose, MD   650 mg at 01/05/19 0416  . allopurinol (ZYLOPRIM) tablet 300 mg  300 mg Oral Daily Thurnell Lose, MD   300 mg at 01/05/19 0954  . apixaban (ELIQUIS) tablet 2.5 mg  2.5 mg Oral BID Thurnell Lose, MD   2.5 mg at 01/05/19 0954  . cefTRIAXone (ROCEPHIN) 2 g in sodium chloride 0.9 % 100 mL IVPB  2 g Intravenous Q24H Thurnell Lose, MD 200 mL/hr at 01/04/19 1635 2 g at 01/04/19 1635  . dextrose 5 %-0.9 % sodium chloride infusion   Intravenous Continuous Thurnell Lose, MD 20 mL/hr at 01/03/19 2218    . diltiazem (CARDIZEM) injection 10 mg  10 mg Intravenous Q6H PRN Thurnell Lose, MD      . feeding supplement (  BOOST / RESOURCE BREEZE) liquid 1 Container  1 Container Oral TID BM Thurnell Lose, MD   1 Container at 01/04/19 2105  . hydrOXYzine (ATARAX/VISTARIL) tablet 10-25 mg  10-25 mg Oral TID PRN Thurnell Lose, MD   25 mg at 12/31/18 1026  . ipratropium-albuterol (DUONEB) 0.5-2.5 (3) MG/3ML nebulizer solution 3 mL  3 mL Nebulization Q8H PRN Thurnell Lose, MD   3 mL at 01/05/19 1048  . lipase/protease/amylase (CREON) capsule 36,000 Units  36,000 Units Oral TID AC Thurnell Lose, MD   36,000 Units at 01/05/19 0724    . MEDLINE mouth rinse  15 mL Mouth Rinse BID Thurnell Lose, MD   15 mL at 01/04/19 2121  . metoprolol succinate (TOPROL-XL) 24 hr tablet 25 mg  25 mg Oral Daily Thurnell Lose, MD   25 mg at 01/05/19 0954  . metroNIDAZOLE (FLAGYL) IVPB 500 mg  500 mg Intravenous Q8H Lala Lund K, MD 100 mL/hr at 01/05/19 0558 500 mg at 01/05/19 0558  . naloxone [facility]) injection 0.2 mg  0.2 mg Intravenous PRN Thurnell Lose, MD   0.2 mg at 01/04/19 1314  . oseltamivir (TAMIFLU) capsule 30 mg  30 mg Oral BID Thurnell Lose, MD   30 mg at 01/05/19 0954  . oxyCODONE (Oxy IR/ROXICODONE) immediate release tablet 5 mg  5 mg Oral Q6H PRN Thurnell Lose, MD      . pantoprazole (PROTONIX) EC tablet 40 mg  40 mg Oral BID Thurnell Lose, MD   40 mg at 01/05/19 0954  . polyethylene glycol (MIRALAX / GLYCOLAX) packet 17 g  17 g Oral BID Thurnell Lose, MD   17 g at 01/04/19 2103  . predniSONE (DELTASONE) tablet 5 mg  5 mg Oral Q breakfast Thurnell Lose, MD   5 mg at 01/05/19 0724  . senna (SENOKOT) tablet 17.2 mg  2 tablet Oral QHS Thurnell Lose, MD   17.2 mg at 01/04/19 2104  . simethicone (MYLICON) chewable tablet 80 mg  80 mg Oral QID PRN Thurnell Lose, MD   80 mg at 12/30/18 2149  . valproic acid (DEPAKENE) solution 250 mg  250 mg Oral TID Thurnell Lose, MD   250 mg at 01/04/19 2104    VITAL SIGNS: BP (!) 125/99   Pulse 98   Temp (!) 97.4 F (36.3 C)   Resp (!) 23   Ht 5' (1.524 m)   Wt 50.1 kg   SpO2 93%   BMI 21.57 kg/m  Filed Weights   12/29/18 1229 12/29/18 2040  Weight: 40.5 kg 50.1 kg    Estimated body mass index is 21.57 kg/m as calculated from the following:   Height as of this encounter: 5' (1.524 m).   Weight as of this encounter: 50.1 kg.  LABS: CBC:    Component Value Date/Time   WBC 6.4 01/05/2019 0619   HGB 7.7 (L) 01/05/2019 0619   HCT 23.2 (L) 01/05/2019 0619   PLT 88 (L) 01/05/2019 0619   MCV 79.5 (L) 01/05/2019 0619   NEUTROABS 19.9 (H)  12/30/2018 2204   LYMPHSABS 1.6 12/30/2018 2204   MONOABS 0.9 12/30/2018 2204   EOSABS 0.1 12/30/2018 2204   BASOSABS 0.0 12/30/2018 2204   Comprehensive Metabolic Panel:    Component Value Date/Time   NA 142 01/05/2019 0619   K 3.6 01/05/2019 0619   CL 113 (H) 01/05/2019 0619   CO2 21 (L) 01/05/2019 0569  BUN <5 (L) 01/05/2019 0619   CREATININE 0.78 01/05/2019 0619   GLUCOSE 100 (H) 01/05/2019 0619   CALCIUM 7.4 (L) 01/05/2019 0619   AST 41 01/01/2019 0953   ALT 24 01/01/2019 0953   ALKPHOS 171 (H) 01/01/2019 0953   BILITOT 0.6 01/01/2019 0953   PROT 5.0 (L) 01/01/2019 0953   ALBUMIN 1.8 (L) 01/01/2019 0953    RADIOGRAPHIC STUDIES: Ct Abdomen Pelvis Wo Contrast  Result Date: 12/29/2018 CLINICAL DATA:  Abdomen distension two days ago here for food poisoning Allergic to iv contrast no oral contrast per MD EXAM: CT ABDOMEN AND PELVIS WITHOUT CONTRAST TECHNIQUE: Multidetector CT imaging of the abdomen and pelvis was performed following the standard protocol without IV contrast. COMPARISON:  12/27/2018 FINDINGS: Lower chest: There is bibasilar atelectasis. Hepatobiliary: The liver is homogeneous. Status post cholecystectomy. Pancreas: Pancreas is atrophic. Coarse calcifications throughout the pancreatic body and tail suggests history of chronic pancreatitis. Spleen: Normal in size without focal abnormality. Adrenals/Urinary Tract: Adrenal glands are unremarkable. Kidneys are normal, without renal calculi, focal lesion, or hydronephrosis. Bladder is unremarkable. Stomach/Bowel: Stomach is unremarkable. Small bowel loops are normal in appearance. There are numerous colonic diverticula. No acute diverticulitis. Significant stool within the rectosigmoid colon. There has been some improvement in the appearance of thickening of the rectosigmoid colon. No perirectal abscess. Vascular/Lymphatic: There is dense atherosclerotic calcification of the abdominal aorta not associated with aneurysm. No  retroperitoneal or mesenteric adenopathy. Reproductive: The uterus is present. Multiple mural calcifications are present. No adnexal mass. Other: No ascites. No abdominal wall hernia. Note is made of fat within the LEFT inguinal ring, likely a lipoma. Diffuse body wall edema. Musculoskeletal: Remote wedge compression fractures of T12, L1, and L3. There is 2 millimeters anterolisthesis of L4 on L5. Degenerative changes are identified at L4-5 and L5-S1. No suspicious lytic or blastic lesions are identified. IMPRESSION: 1. Significant stool within the rectosigmoid colon. There has been some improvement in the appearance of thickening of the rectosigmoid colon. 2. Diverticulosis without acute diverticulitis. 3. Status post cholecystectomy. 4. Changes of chronic pancreatitis. 5. Remote wedge compression fractures of T12, L1, and L3. Aortic Atherosclerosis (ICD10-I70.0). Electronically Signed   By: Nolon Nations M.D.   On: 12/29/2018 13:12   Dg Chest Port 1 View  Result Date: 01/05/2019 CLINICAL DATA:  Five day history of shortness of breath. Acute mid chest pain that began today. EXAM: PORTABLE CHEST 1 VIEW COMPARISON:  01/04/2019 and earlier. FINDINGS: Cardiac silhouette moderately enlarged, unchanged. Increasing interstitial opacities superimposed upon the previously identified interstitial lung disease. Stable small BILATERAL pleural effusions. Prior thoracic spine corpectomy and fusion. Lytic lesion in the proximal RIGHT humerus. Severe degenerative changes involving both shoulders. IMPRESSION: 1. Developing interstitial pulmonary edema is suspected, superimposed upon the previously identified interstitial lung disease. 2. Stable small BILATERAL pleural effusions. 3. Lytic lesion involving the proximal RIGHT humerus. Does the patient have a known malignancy or multiple myeloma? Electronically Signed   By: Evangeline Dakin M.D.   On: 01/05/2019 08:58   Dg Chest Port 1 View  Result Date: 01/04/2019 CLINICAL  DATA:  Difficulty breathing EXAM: PORTABLE CHEST 1 VIEW COMPARISON:  December 29, 2018; November 13, 2018 FINDINGS: Diffuse interstitial thickening remains stable. There is no frank edema consolidation. Heart is upper normal in size with pulmonary vascularity normal. No adenopathy. Extensive postoperative change in the thoracic spine noted. Evidence of old right-sided rib trauma laterally. There is arthropathy in the shoulders, stable. Cystic area in the proximal right humerus is stable.  IMPRESSION: Diffuse interstitial thickening, likely reflecting chronic fibrosis. No frank edema or consolidation evident. Stable cardiac silhouette. Extensive postoperative change in the thoracic spine. Advanced arthropathy in the shoulders. Prior rib trauma on the right. Electronically Signed   By: Lowella Grip III M.D.   On: 01/04/2019 08:51   Dg Chest Port 1 View  Result Date: 12/29/2018 CLINICAL DATA:  63 year old female with altered mental status, fever, decreased p.o. intake. EXAM: PORTABLE CHEST 1 VIEW COMPARISON:  Portable chest 11/13/2018 and earlier. FINDINGS: Portable AP semi upright view at 1320 hours. Sequelae of previous thoracic spine corpectomy and posterior fusion. Hardware appears stable. Stable visualized osseous structures. Stable lung volumes and mediastinal contours. Visualized tracheal air column is within normal limits. Chronic increased pulmonary interstitial markings appear stable since 2019. No pneumothorax, pleural effusion or acute pulmonary opacity. Mildly increased bowel gas in the abdomen. Stable cholecystectomy clips. IMPRESSION: Stable.  No acute cardiopulmonary abnormality. Electronically Signed   By: Genevie Ann M.D.   On: 12/29/2018 14:03   Dg Abd Portable 1v  Result Date: 12/30/2018 CLINICAL DATA:  Abdominal pain EXAM: PORTABLE ABDOMEN - 1 VIEW COMPARISON:  CT 12/29/2018 FINDINGS: Surgical hardware in the lower thoracic spine. Dense aortic atherosclerosis. Mild diffuse increased bowel gas  without obstructive pattern. Possible bowel wall thickening at the hepatic flexure and descending colon. No radiopaque calculi. IMPRESSION: Diffuse increased bowel gas without obstructive pattern. There may be bowel wall edema at the hepatic flexure and descending colon as may be seen with colitis Electronically Signed   By: Donavan Foil M.D.   On: 12/30/2018 21:45   Dg Abd Portable 2v  Result Date: 01/01/2019 CLINICAL DATA:  Vomiting. EXAM: PORTABLE ABDOMEN - 2 VIEW COMPARISON:  Radiograph of December 30, 2018. FINDINGS: No small bowel dilatation is noted. Mildly dilated colon loop is seen in right side of abdomen which most likely represents ileus. Status post cholecystectomy. There is no evidence of free air. No radio-opaque calculi or other significant radiographic abnormality is seen. IMPRESSION: Mildly dilated colonic loops seen in right side of abdomen which may represent ileus. Electronically Signed   By: Marijo Conception, M.D.   On: 01/01/2019 14:50    PERFORMANCE STATUS (ECOG) : 3 - Symptomatic, >50% confined to bed  Review of Systems As noted above. Otherwise, a complete review of systems is negative.  Physical Exam General: NAD, frail appearing Cardiovascular: regular rate and rhythm Pulmonary: Coarse anterior fields with expiratory wheezing Abdomen: soft, nontender, + bowel sounds Extremities: no edema Skin: no rashes Neurological: Weakness but otherwise nonfocal  IMPRESSION: Met with patient to discuss goals.  She says at baseline, she lives at home with her family.  She is primarily in a wheelchair and has been nonambulatory for many years.  Patient admits to feeling like she has "gone down" and declined over the past several months associated with recurrent hospitalizations.  Patient has been evaluated by PT and rehab was recommended but patient and family refused.  Patient tells me today that she wants to return home at time of discharge.  She asks about home health involvement  instead.  Patient says she does have a Psychologist, counselling that comes daily to her home to help with personal care and household chores.  We discussed patient's current medical problems and the potential for future decline.  However, patient says she is optimistic that her multiple medical problems will stabilize.  She says that she is in agreement with the current scope of treatment.  Patient would  desire continued hospitalizations and aggressive care if necessary.    We discussed CODE STATUS.  Patient says she would want an attempt at resuscitation and has informed her family of this wish.  I explained that resuscitation is often associated with a poor outcome or reduced quality of life.  However, patient was firm and her stated desire to receive CPR and intubation if necessary.  Patient does say that she would not want to be prolonged long term on machines if it became apparent that she could not survive without them.  I discussed the option of involving palliative care in her home.  Patient thought that was something she would desire.  Symptomatically, she has chronic pain.  She feels acutely miserable but does not elucidate on specifics.  Opioids were reduced following Narcan use yesterday.  It would be reasonable to judiciously titrate opioids for effect.  May consider use of scheduled acetaminophen.  Ibuprofen can also be considered but patient does have history of GI bleed.  PLAN: Continue current scope of treatment Full code Home with home health when medically stable Community-based palliative care to follow at home   Time Total: 50 minutes  Visit consisted of counseling and education dealing with the complex and emotionally intense issues of symptom management and palliative care in the setting of serious and potentially life-threatening illness.Greater than 50%  of this time was spent counseling and coordinating care related to the above assessment and plan.  Signed by: Altha Harm, PhD, NP-C [phone] (Work Cell)

## 2019-01-06 ENCOUNTER — Inpatient Hospital Stay (HOSPITAL_COMMUNITY): Payer: Medicaid Other

## 2019-01-06 LAB — CBC
HCT: 23.2 % — ABNORMAL LOW (ref 36.0–46.0)
Hemoglobin: 8.3 g/dL — ABNORMAL LOW (ref 12.0–15.0)
MCH: 27.5 pg (ref 26.0–34.0)
MCHC: 35.8 g/dL (ref 30.0–36.0)
MCV: 76.8 fL — ABNORMAL LOW (ref 80.0–100.0)
Platelets: 73 10*3/uL — ABNORMAL LOW (ref 150–400)
RBC: 3.02 MIL/uL — AB (ref 3.87–5.11)
RDW: 17.7 % — ABNORMAL HIGH (ref 11.5–15.5)
WBC: 5 10*3/uL (ref 4.0–10.5)
nRBC: 0.4 % — ABNORMAL HIGH (ref 0.0–0.2)

## 2019-01-06 LAB — BRAIN NATRIURETIC PEPTIDE: B Natriuretic Peptide: 1244.3 pg/mL — ABNORMAL HIGH (ref 0.0–100.0)

## 2019-01-06 LAB — BASIC METABOLIC PANEL
Anion gap: 10 (ref 5–15)
BUN: 8 mg/dL (ref 6–20)
CO2: 20 mmol/L — ABNORMAL LOW (ref 22–32)
Calcium: 7.4 mg/dL — ABNORMAL LOW (ref 8.9–10.3)
Chloride: 107 mmol/L (ref 98–111)
Creatinine, Ser: 0.86 mg/dL (ref 0.44–1.00)
GFR calc Af Amer: >60 mL/min (ref >60)
GFR calc non Af Amer: >60 mL/min (ref >60)
Glucose, Bld: 424 mg/dL — ABNORMAL HIGH (ref 70–99)
Potassium: 3.2 mmol/L — ABNORMAL LOW (ref 3.5–5.1)
Sodium: 137 mmol/L (ref 135–145)

## 2019-01-06 LAB — MAGNESIUM: Magnesium: 1.5 mg/dL — ABNORMAL LOW (ref 1.7–2.4)

## 2019-01-06 MED ORDER — FUROSEMIDE 10 MG/ML IJ SOLN
40.0000 mg | Freq: Once | INTRAMUSCULAR | Status: AC
  Administered 2019-01-06: 40 mg via INTRAVENOUS
  Filled 2019-01-06: qty 4

## 2019-01-06 MED ORDER — POTASSIUM CHLORIDE CRYS ER 20 MEQ PO TBCR
20.0000 meq | EXTENDED_RELEASE_TABLET | Freq: Once | ORAL | Status: AC
  Administered 2019-01-06: 20 meq via ORAL
  Filled 2019-01-06: qty 1

## 2019-01-06 MED ORDER — MAGNESIUM SULFATE 2 GM/50ML IV SOLN
2.0000 g | Freq: Once | INTRAVENOUS | Status: AC
  Administered 2019-01-06: 2 g via INTRAVENOUS
  Filled 2019-01-06: qty 50

## 2019-01-06 MED ORDER — POTASSIUM CHLORIDE CRYS ER 20 MEQ PO TBCR
40.0000 meq | EXTENDED_RELEASE_TABLET | Freq: Once | ORAL | Status: AC
  Administered 2019-01-06: 40 meq via ORAL
  Filled 2019-01-06: qty 2

## 2019-01-06 MED ORDER — FUROSEMIDE 40 MG PO TABS
40.0000 mg | ORAL_TABLET | Freq: Once | ORAL | Status: AC
  Administered 2019-01-06: 40 mg via ORAL
  Filled 2019-01-06: qty 1

## 2019-01-06 NOTE — Progress Notes (Signed)
PROGRESS NOTE        PATIENT DETAILS Name: Amber [name] Age: 63 y.o. Sex: female Date of Birth: [birth year] Admit Date: 12/29/2018 Admitting Physician Rush Farmer, MD PCP:Sun, Mikeal Hawthorne, MD  Brief Narrative: Patient is a 63 y.o. female with history of chronic diastolic heart failure, PAF on anticoagulation, embolic CVA,  seizure disorder, rheumatoid arthritis, prior back back surgeries, chronic pain syndrome-presented to the ED for hypotension, was found to have septic shock secondary to gram-negative bacteremia.  See below for further details  Subjective:  Patient in bed, appears comfortable, denies any headache, no fever, no chest pain or pressure, improved shortness of breath , no abdominal pain. No focal weakness.   Assessment/Plan:  Septic shock secondary to E. coli bacteremia: Sepsis pathophysiology has resolved, continue Rocephin thereafter oral antibiotic transition once she goes home.  Cough-shortness of breath and orthopnea.  Combination of hospital-acquired influenza infection.  Placed on Tamiflu, proBNP elevated, stopped IV fluids, is being diuresed with Lasix, symptoms gradually improving, encouraged to sit up in chair, have added  Flutter valve added for pulmonary toiletry along with nebulizer treatments and oxygen for supportive care.  Acute on chronic diastolic CHF.  EF 65% on echocardiogram in 2019.  Is with Lasix, monitor electrolytes, continue home dose beta-blocker, encouraged to sit up in chair, oxygen nebulizer treatments PRN.    Abdominal pain: Much improved-she had numerous BMs overnight after she was started on Dulcolax x1, senna/MiraLAX.  She has chronic abdominal pain at baseline from chronic pancreatitis.    Lactic acidosis: Secondary to sepsis, has normalized.  Elevated troponin: Trend is flat-not consistent with ACS-likely demand ischemia.  EKG without acute changes.  Given poor overall health-bedbound status-best served by conservative  management.  PAF: Sinus rhythm-has prior history of embolic stroke-continue Eliquis.  Chronic thrombocytopenia: Unclear etiology-platelets close to usual baseline.  Follow  Anemia: Suspect has chronic anemia at baseline-worsened by acute illness, has required 1 unit of PRBC transfusion during this hospital stay.  Hemoglobin low but stable-follow.  History of rheumatoid arthritis with chronic steroid use: Appears to be on chronic prednisone-currently on stress dose IV Solu-Cortef-we will slowly taper over the next few days.  Chronic pancreatitis: Continue Creon  History of seizures: Continue Depakote  History of CVA: Reviewed records in care everywhere-she has had prior embolic strokes-difficult neurologic exam due to lethargy/weakness-but appears nonfocal-on Eliquis  Generalized deconditioning/worsening debility: Per patient-she has not ambulated in 4 years-mostly bed to wheelchair bound.  She is going to talk to her family about SNF rehab-as per social work-her family wants to take her home.    Chronic pain syndrome (back pain, neck pain, abdominal pain): Continue prn oxycodone-await x-ray abdomen-may need to be started on a bowel regimen.  Monitor on low-dose narcotics with caution.  Has tendency to accidentally overdose.     DVT Prophylaxis: Full dose anticoagulation with Eliquis  Code Status: Full code  Family Communication: None at bedside  Disposition Plan: Remain inpatient-hopefully discharge tomorrow if clinical improvement continues.  Antimicrobial agents: Anti-infectives (From admission, onward)   Start     Dose/Rate Route Frequency Ordered Stop   01/05/19 1000  oseltamivir (TAMIFLU) capsule 30 mg     30 mg Oral 2 times daily 01/05/19 0826 01/10/19 0959   01/04/19 1500  cefTRIAXone (ROCEPHIN) 2 g in sodium chloride 0.9 % 100 mL IVPB  2 g 200 mL/hr over 30 Minutes Intravenous Every 24 hours 01/04/19 1416     01/04/19 1500  metroNIDAZOLE (FLAGYL) IVPB 500 mg      500 mg 100 mL/hr over 60 Minutes Intravenous Every 8 hours 01/04/19 1416     01/03/19 1030  sulfamethoxazole-trimethoprim (BACTRIM DS,SEPTRA DS) 800-160 MG per tablet 1 tablet  Status:  Discontinued     1 tablet Oral Every 12 hours 01/03/19 1019 01/04/19 1416   01/03/19 0000  sulfamethoxazole-trimethoprim (BACTRIM DS,SEPTRA DS) 800-160 MG tablet     1 tablet Oral Every 12 hours 01/03/19 1059     12/31/18 1330  vancomycin (VANCOCIN) IVPB 750 mg/150 ml premix  Status:  Discontinued     750 mg 150 mL/hr over 60 Minutes Intravenous Every 48 hours 12/29/18 1738 12/30/18 1127   12/30/18 1200  cefTRIAXone (ROCEPHIN) 2 g in sodium chloride 0.9 % 100 mL IVPB  Status:  Discontinued     2 g 200 mL/hr over 30 Minutes Intravenous Every 24 hours 12/30/18 1127 01/03/19 1019   12/30/18 0130  ceFEPIme (MAXIPIME) 1 g in sodium chloride 0.9 % 100 mL IVPB  Status:  Discontinued     1 g 200 mL/hr over 30 Minutes Intravenous Every 12 hours 12/29/18 1750 12/30/18 1127   12/29/18 1715  ceFEPIme (MAXIPIME) 2 g in sodium chloride 0.9 % 100 mL IVPB  Status:  Discontinued     2 g 200 mL/hr over 30 Minutes Intravenous  Once 12/29/18 1706 12/29/18 1750   12/29/18 1715  metroNIDAZOLE (FLAGYL) IVPB 500 mg  Status:  Discontinued     500 mg 100 mL/hr over 60 Minutes Intravenous Every 8 hours 12/29/18 1706 12/30/18 1127   12/29/18 1715  vancomycin (VANCOCIN) IVPB 1000 mg/200 mL premix  Status:  Discontinued     1,000 mg 200 mL/hr over 60 Minutes Intravenous  Once 12/29/18 1706 12/29/18 1750   12/29/18 1230  ceFEPIme (MAXIPIME) 2 g in sodium chloride 0.9 % 100 mL IVPB     2 g 200 mL/hr over 30 Minutes Intravenous  Once 12/29/18 1226 12/29/18 1424   12/29/18 1230  metroNIDAZOLE (FLAGYL) IVPB 500 mg  Status:  Discontinued     500 mg 100 mL/hr over 60 Minutes Intravenous Every 8 hours 12/29/18 1226 12/29/18 1706   12/29/18 1230  vancomycin (VANCOCIN) IVPB 1000 mg/200 mL premix     1,000 mg 200 mL/hr over 60 Minutes  Intravenous  Once 12/29/18 1226 12/29/18 1551      Procedures: None  CONSULTS:  None  Time spent: 25- minutes-Greater than 50% of this time was spent in counseling, explanation of diagnosis, planning of further management, and coordination of care.  MEDICATIONS: Scheduled Meds: . allopurinol  300 mg Oral Daily  . apixaban  2.5 mg Oral BID  . feeding supplement  1 Container Oral TID BM  . furosemide  40 mg Oral Once  . lipase/protease/amylase  36,000 Units Oral TID AC  . mouth rinse  15 mL Mouth Rinse BID  . metoprolol succinate  25 mg Oral Daily  . oseltamivir  30 mg Oral BID  . pantoprazole  40 mg Oral BID  . polyethylene glycol  17 g Oral BID  . potassium chloride  40 mEq Oral Once  . predniSONE  5 mg Oral Q breakfast  . senna  2 tablet Oral QHS  . valproic acid  250 mg Oral TID   Continuous Infusions: . cefTRIAXone (ROCEPHIN)  IV 2 g (01/05/19 1845)  .  magnesium sulfate 1 - 4 g bolus IVPB    . metronidazole 500 mg (01/06/19 0817)   PRN Meds:.acetaminophen, diltiazem, hydrOXYzine, ipratropium-albuterol, naLOXone (NARCAN)  injection, oxyCODONE, simethicone   PHYSICAL EXAM: Vital signs: Vitals:   01/06/19 0412 01/06/19 0500 01/06/19 0600 01/06/19 0700  BP:      Pulse: 89 84 78 90  Resp: (!) 27 (!) 38 (!) 33 (!) 36  Temp:      TempSrc:      SpO2: 97% 95% 97% 95%  Weight:      Height:       Filed Weights   12/29/18 1229 12/29/18 2040  Weight: 40.5 kg 50.1 kg   Body mass index is 21.57 kg/m.   Exam  Awake Alert, Oriented X 3, No new F.N deficits, overall extremely frail and cachectic African-American female who appears much older than her stated age [place].AT,PERRAL Supple Neck,No JVD, No cervical lymphadenopathy appriciated.  Symmetrical Chest wall movement, Good air movement bilaterally, bilateral coarse breath sounds along with rails RRR,No Gallops, Rubs or new Murmurs, No Parasternal Heave +ve B.Sounds, Abd Soft, No tenderness, No organomegaly  appriciated, No rebound - guarding or rigidity. No Cyanosis, Clubbing or edema, No new Rash or bruise   I have personally reviewed following labs and imaging studies  LABORATORY DATA: CBC: Recent Labs  Lab 12/30/18 2204 12/31/18 0405 01/01/19 0953 01/02/19 0700 01/05/19 0619 01/06/19 0432  WBC 23.1* 21.6* 19.8* 15.4* 6.4 5.0  NEUTROABS 19.9*  --   --   --   --   --   HGB 8.5* 8.1* 9.5* 9.1* 7.7* 8.3*  HCT 26.0* 24.3* 28.5* 25.7* 23.2* 23.2*  MCV 81.3 80.7 79.4* 77.6* 79.5* 76.8*  PLT 72* 72* 69* 62* 88* 73*    Basic Metabolic Panel: Recent Labs  Lab 12/31/18 0405 01/01/19 0953  01/03/19 0455  01/03/19 1501 01/04/19 0057 01/04/19 0407 01/05/19 0619 01/06/19 0846  NA 141 144   < > 143   < > 142 141 142 142 137  K 4.7 4.1   < > 2.7*   < > 2.3* 3.7 4.5 3.6 3.2*  CL 120* 119*   < > 111   < > 107 108 112* 113* 107  CO2 14* 17*   < > 21*   < > '24 23 22 '$ 21* 20*  GLUCOSE 79 136*   < > 121*   < > 170* 111* 76 100* 424*  BUN 11 11   < > 6   < > 5* 5* 6 <5* 8  CREATININE 1.02* 0.95   < > 0.87   < > 0.84 0.78 0.81 0.78 0.86  CALCIUM 8.0* 7.9*   < > 7.3*   < > 7.3* 7.4* 7.4* 7.4* 7.4*  MG 1.9 1.9  --  1.6*  --   --   --  2.1  --  1.5*  PHOS 3.9 3.2  --   --   --   --   --   --   --   --    < > = values in this interval not displayed.    GFR: Estimated Creatinine Clearance: 50 mL/min (by C-G formula based on SCr of 0.86 mg/dL).  Liver Function Tests: Recent Labs  Lab 12/31/18 0405 01/01/19 0953  AST 40 41  ALT 21 24  ALKPHOS 154* 171*  BILITOT 0.6 0.6  PROT 4.6* 5.0*  ALBUMIN 1.7* 1.8*   Recent Labs  Lab 12/30/18 1155 12/30/18 2204  LIPASE 21 22  AMYLASE  --  20*   Recent Labs  Lab 01/01/19 0953  AMMONIA 53*    Coagulation Profile: No results for input(s): INR, PROTIME in the last 168 hours.  Cardiac Enzymes: Recent Labs  Lab 12/31/18 0405 12/31/18 1043 12/31/18 1632 01/01/19 0953  TROPONINI 0.22* 0.23* 0.20* 0.34*    BNP (last 3 results) No  results for input(s): PROBNP in the last 8760 hours.  HbA1C: No results for input(s): HGBA1C in the last 72 hours.  CBG: Recent Labs  Lab 12/31/18 1536 01/01/19 0015 01/01/19 0349 01/01/19 0809 01/01/19 1256  GLUCAP 85 144* 146* 126* 159*    Lipid Profile: No results for input(s): CHOL, HDL, LDLCALC, TRIG, CHOLHDL, LDLDIRECT in the last 72 hours.  Thyroid Function Tests: No results for input(s): TSH, T4TOTAL, FREET4, T3FREE, THYROIDAB in the last 72 hours.  Anemia Panel: No results for input(s): VITAMINB12, FOLATE, FERRITIN, TIBC, IRON, RETICCTPCT in the last 72 hours.  Urine analysis:    Component Value Date/Time   COLORURINE YELLOW 01/04/2019 2048   APPEARANCEUR HAZY (A) 01/04/2019 2048   LABSPEC 1.015 01/04/2019 2048   PHURINE 7.0 01/04/2019 2048   GLUCOSEU NEGATIVE 01/04/2019 2048   HGBUR SMALL (A) 01/04/2019 2048   BILIRUBINUR NEGATIVE 01/04/2019 2048   KETONESUR NEGATIVE 01/04/2019 2048   PROTEINUR 30 (A) 01/04/2019 2048   UROBILINOGEN 1.0 07/27/2015 1052   NITRITE NEGATIVE 01/04/2019 2048   LEUKOCYTESUR NEGATIVE 01/04/2019 2048    Sepsis Labs: Lactic Acid, Venous    Component Value Date/Time   LATICACIDVEN 1.6 01/01/2019 0953    MICROBIOLOGY: Recent Results (from the past 240 hour(s))  Blood Culture (routine x 2)     Status: None   Collection Time: 12/29/18 12:23 PM  Result Value Ref Range Status   Specimen Description BLOOD RIGHT FOREARM  Final   Special Requests   Final    BOTTLES DRAWN AEROBIC AND ANAEROBIC Blood Culture adequate volume Performed at [facility], [place] [address], [place], [place] [address]    Culture   Final    NO GROWTH 5 DAYS Performed at [facility], [place] [address], [place], [place] [address]    Report Status 01/03/2019 FINAL  Final  Urine culture     Status: Abnormal   Collection Time: 12/29/18 12:24 PM  Result Value Ref Range Status   Specimen Description   Final    URINE, RANDOM Performed at  [facility] [address] [place], [place] [address]    Special Requests   Final    NONE Performed at [facility], [place] [address] [place], [place] [address]    Culture MULTIPLE SPECIES PRESENT, SUGGEST RECOLLECTION (A)  Final   Report Status 12/31/2018 FINAL  Final  Blood Culture (routine x 2)     Status: Abnormal   Collection Time: 12/29/18 12:28 PM  Result Value Ref Range Status   Specimen Description BLOOD RIGHT ARM  Final   Special Requests   Final    BOTTLES DRAWN AEROBIC AND ANAEROBIC Blood Culture adequate volume Performed at [place] [address] [place], [place] [address]    Culture  Setup Time   Final    GRAM NEGATIVE RODS ANAEROBIC BOTTLE ONLY Organism ID to follow CRITICAL RESULT CALLED TO, READ BACK BY AND VERIFIED WITH: Jene Every PharmD 9:45 12/30/18 ([email or web address]) Performed at [facility], [address], [place], [place] [address]    Culture ESCHERICHIA COLI (A)  Final  Report Status 01/01/2019 FINAL  Final   Organism ID, Bacteria ESCHERICHIA COLI  Final      Susceptibility   Escherichia coli - MIC*    AMPICILLIN >=32 RESISTANT Resistant     CEFAZOLIN 32 INTERMEDIATE Intermediate     CEFEPIME <=1 SENSITIVE Sensitive     CEFTAZIDIME <=1 SENSITIVE Sensitive     CEFTRIAXONE <=1 SENSITIVE Sensitive     CIPROFLOXACIN >=4 RESISTANT Resistant     GENTAMICIN <=1 SENSITIVE Sensitive     IMIPENEM <=0.25 SENSITIVE Sensitive     TRIMETH/SULFA <=20 SENSITIVE Sensitive     AMPICILLIN/SULBACTAM >=32 RESISTANT Resistant     PIP/TAZO >=128 RESISTANT Resistant     Extended ESBL NEGATIVE Sensitive     * ESCHERICHIA COLI  Blood Culture ID Panel (Reflexed)     Status: Abnormal   Collection Time: 12/29/18 12:28 PM  Result Value Ref Range Status   Enterococcus species NOT DETECTED NOT DETECTED Final   Listeria monocytogenes NOT DETECTED NOT DETECTED Final   Staphylococcus species NOT DETECTED NOT DETECTED  Final   Staphylococcus aureus (BCID) NOT DETECTED NOT DETECTED Final   Streptococcus species NOT DETECTED NOT DETECTED Final   Streptococcus agalactiae NOT DETECTED NOT DETECTED Final   Streptococcus pneumoniae NOT DETECTED NOT DETECTED Final   Streptococcus pyogenes NOT DETECTED NOT DETECTED Final   Acinetobacter baumannii NOT DETECTED NOT DETECTED Final   Enterobacteriaceae species DETECTED (A) NOT DETECTED Final    Comment: Enterobacteriaceae represent a large family of gram-negative bacteria, not a single organism. CRITICAL RESULT CALLED TO, READ BACK BY AND VERIFIED WITH: Jene Every PharmD 9:45 12/30/18 ([email or web address])    Enterobacter cloacae complex NOT DETECTED NOT DETECTED Final   Escherichia coli DETECTED (A) NOT DETECTED Final    Comment: CRITICAL RESULT CALLED TO, READ BACK BY AND VERIFIED WITH: Jene Every PharmD 9:45 12/30/18 ([email or web address])    Klebsiella oxytoca NOT DETECTED NOT DETECTED Final   Klebsiella pneumoniae NOT DETECTED NOT DETECTED Final   Proteus species NOT DETECTED NOT DETECTED Final   Serratia marcescens NOT DETECTED NOT DETECTED Final   Carbapenem resistance NOT DETECTED NOT DETECTED Final   Haemophilus influenzae NOT DETECTED NOT DETECTED Final   Neisseria meningitidis NOT DETECTED NOT DETECTED Final   Pseudomonas aeruginosa NOT DETECTED NOT DETECTED Final   Candida albicans NOT DETECTED NOT DETECTED Final   Candida glabrata NOT DETECTED NOT DETECTED Final   Candida krusei NOT DETECTED NOT DETECTED Final   Candida parapsilosis NOT DETECTED NOT DETECTED Final   Candida tropicalis NOT DETECTED NOT DETECTED Final    Comment: Performed at [facility], [place] [address], [place], [place] [address]  MRSA PCR Screening     Status: None   Collection Time: 12/29/18  8:36 PM  Result Value Ref Range Status   MRSA by PCR NEGATIVE NEGATIVE Final    Comment:        The GeneXpert MRSA Assay (FDA approved for NASAL specimens only), is one component of a comprehensive MRSA  colonization surveillance program. It is not intended to diagnose MRSA infection nor to guide or monitor treatment for MRSA infections. Performed at [facility], [place] [address], [place], [place] [address]   Gastrointestinal Panel by PCR , Stool     Status: None   Collection Time: 12/30/18 10:17 PM  Result Value Ref Range Status   Campylobacter species NOT DETECTED NOT DETECTED Final   Plesimonas shigelloides NOT DETECTED NOT DETECTED Final   Salmonella species NOT DETECTED NOT DETECTED  Final   Yersinia enterocolitica NOT DETECTED NOT DETECTED Final   Vibrio species NOT DETECTED NOT DETECTED Final   Vibrio cholerae NOT DETECTED NOT DETECTED Final   Enteroaggregative E coli (EAEC) NOT DETECTED NOT DETECTED Final   Enteropathogenic E coli (EPEC) NOT DETECTED NOT DETECTED Final   Enterotoxigenic E coli (ETEC) NOT DETECTED NOT DETECTED Final   Shiga like toxin producing E coli (STEC) NOT DETECTED NOT DETECTED Final   Shigella/Enteroinvasive E coli (EIEC) NOT DETECTED NOT DETECTED Final   Cryptosporidium NOT DETECTED NOT DETECTED Final   Cyclospora cayetanensis NOT DETECTED NOT DETECTED Final   Entamoeba histolytica NOT DETECTED NOT DETECTED Final   Giardia lamblia NOT DETECTED NOT DETECTED Final   Adenovirus F40/41 NOT DETECTED NOT DETECTED Final   Astrovirus NOT DETECTED NOT DETECTED Final   Norovirus GI/GII NOT DETECTED NOT DETECTED Final   Rotavirus A NOT DETECTED NOT DETECTED Final   Sapovirus (I, II, IV, and V) NOT DETECTED NOT DETECTED Final    Comment: Performed at [facility], [address] [place], [place] [address]  Stool culture (children & immunocomp patients)     Status: None   Collection Time: 12/30/18 10:17 PM  Result Value Ref Range Status   Salmonella/Shigella Screen Final report  Final   Campylobacter Culture Final report  Final   E coli, Shiga toxin Assay Negative Negative Final    Comment: (NOTE) Performed At: [facility] [place], [place] [id number] Rush Farmer MD [id number]   STOOL CULTURE REFLEX - RSASHR     Status: None   Collection Time: 12/30/18 10:17 PM  Result Value Ref Range Status   Stool Culture result 1 (RSASHR) Comment  Final    Comment: (NOTE) No Salmonella or Shigella recovered. Performed At: [facility] [address] [place], [place] [id number] Rush Farmer MD YO:[id number]   STOOL CULTURE Reflex - CMPCXR     Status: None   Collection Time: 12/30/18 10:17 PM  Result Value Ref Range Status   Stool Culture result 1 (CMPCXR) Comment  Final    Comment: (NOTE) No Campylobacter species isolated. Performed At: [facility] [place], [place] [id number] Rush Farmer MD EH:[id number]   Culture, blood (routine x 2)     Status: None (Preliminary result)   Collection Time: 01/04/19  6:38 PM  Result Value Ref Range Status   Specimen Description BLOOD LEFT HAND  Final   Special Requests   Final    BOTTLES DRAWN AEROBIC ONLY Blood Culture adequate volume   Culture   Final    NO GROWTH < 24 HOURS Performed at [facility], [address], [place], [place] [address]    Report Status PENDING  Incomplete  Culture, blood (routine x 2)     Status: None (Preliminary result)   Collection Time: 01/04/19  6:38 PM  Result Value Ref Range Status   Specimen Description BLOOD RIGHT HAND  Final   Special Requests   Final    BOTTLES DRAWN AEROBIC ONLY Blood Culture results may not be optimal due to an excessive volume of blood received in culture bottles   Culture   Final    NO GROWTH < 24 HOURS Performed at [facility], [place] [address], [place], [place] [address]    Report Status PENDING  Incomplete  Culture, Urine     Status: Abnormal (Preliminary result)   Collection Time: 01/04/19  8:48 PM  Result Value Ref Range Status  Specimen Description URINE, RANDOM  Final   Special Requests   Final    NONE Performed at [facility], [place] [address] [place], [place] [address]    Culture >=100,000 COLONIES/mL ENTEROCOCCUS FAECIUM (A)  Final   Report Status PENDING  Incomplete    RADIOLOGY STUDIES/RESULTS: Ct Abdomen Pelvis Wo Contrast  Result Date: 12/29/2018 CLINICAL DATA:  Abdomen distension two days ago here for food poisoning Allergic to iv contrast no oral contrast per MD EXAM: CT ABDOMEN AND PELVIS WITHOUT CONTRAST TECHNIQUE: Multidetector CT imaging of the abdomen and pelvis was performed following the standard protocol without IV contrast. COMPARISON:  12/27/2018 FINDINGS: Lower chest: There is bibasilar atelectasis. Hepatobiliary: The liver is homogeneous. Status post cholecystectomy. Pancreas: Pancreas is atrophic. Coarse calcifications throughout the pancreatic body and tail suggests history of chronic pancreatitis. Spleen: Normal in size without focal abnormality. Adrenals/Urinary Tract: Adrenal glands are unremarkable. Kidneys are normal, without renal calculi, focal lesion, or hydronephrosis. Bladder is unremarkable. Stomach/Bowel: Stomach is unremarkable. Small bowel loops are normal in appearance. There are numerous colonic diverticula. No acute diverticulitis. Significant stool within the rectosigmoid colon. There has been some improvement in the appearance of thickening of the rectosigmoid colon. No perirectal abscess. Vascular/Lymphatic: There is dense atherosclerotic calcification of the abdominal aorta not associated with aneurysm. No retroperitoneal or mesenteric adenopathy. Reproductive: The uterus is present. Multiple mural calcifications are present. No adnexal mass. Other: No ascites. No abdominal wall hernia. Note is made of fat within the LEFT inguinal ring, likely a lipoma. Diffuse body wall edema. Musculoskeletal: Remote wedge compression fractures of T12, L1, and L3. There is 2 millimeters anterolisthesis of L4 on L5. Degenerative changes are identified at L4-5 and L5-S1. No suspicious lytic or blastic lesions are  identified. IMPRESSION: 1. Significant stool within the rectosigmoid colon. There has been some improvement in the appearance of thickening of the rectosigmoid colon. 2. Diverticulosis without acute diverticulitis. 3. Status post cholecystectomy. 4. Changes of chronic pancreatitis. 5. Remote wedge compression fractures of T12, L1, and L3. Aortic Atherosclerosis (ICD10-I70.0). Electronically Signed   By: Nolon Nations M.D.   On: 12/29/2018 13:12   Dg Chest Port 1 View  Result Date: 01/06/2019 CLINICAL DATA:  Cough and congestion. EXAM: PORTABLE CHEST 1 VIEW COMPARISON:  January 05, 2019 FINDINGS: Stable cardiomegaly. Stable small pleural effusions. Increasing pulmonary opacities suggestive of worsening edema. Persistent lytic lesion in the proximal right humerus. IMPRESSION: 1. Cardiomegaly and worsening pulmonary edema.  Small effusions. 2. Persistent lytic lesion in the proximal right humerus. Gout was suggested on previous right shoulder x-rays. Electronically Signed   By: Dorise Bullion III M.D   On: 01/06/2019 09:09   Dg Chest Port 1 View  Result Date: 01/05/2019 CLINICAL DATA:  Five day history of shortness of breath. Acute mid chest pain that began today. EXAM: PORTABLE CHEST 1 VIEW COMPARISON:  01/04/2019 and earlier. FINDINGS: Cardiac silhouette moderately enlarged, unchanged. Increasing interstitial opacities superimposed upon the previously identified interstitial lung disease. Stable small BILATERAL pleural effusions. Prior thoracic spine corpectomy and fusion. Lytic lesion in the proximal RIGHT humerus. Severe degenerative changes involving both shoulders. IMPRESSION: 1. Developing interstitial pulmonary edema is suspected, superimposed upon the previously identified interstitial lung disease. 2. Stable small BILATERAL pleural effusions. 3. Lytic lesion involving the proximal RIGHT humerus. Does the patient have a known malignancy or multiple myeloma? Electronically Signed   By: Evangeline Dakin M.D.   On: 01/05/2019 08:58   Dg Chest [facility]  Result Date: 01/04/2019 CLINICAL DATA:  Difficulty breathing EXAM: PORTABLE CHEST 1 VIEW COMPARISON:  December 29, 2018; November 13, 2018 FINDINGS: Diffuse interstitial thickening remains stable. There is no frank edema consolidation. Heart is upper normal in size with pulmonary vascularity normal. No adenopathy. Extensive postoperative change in the thoracic spine noted. Evidence of old right-sided rib trauma laterally. There is arthropathy in the shoulders, stable. Cystic area in the proximal right humerus is stable. IMPRESSION: Diffuse interstitial thickening, likely reflecting chronic fibrosis. No frank edema or consolidation evident. Stable cardiac silhouette. Extensive postoperative change in the thoracic spine. Advanced arthropathy in the shoulders. Prior rib trauma on the right. Electronically Signed   By: Lowella Grip III M.D.   On: 01/04/2019 08:51   Dg Chest Port 1 View  Result Date: 12/29/2018 CLINICAL DATA:  63 year old female with altered mental status, fever, decreased p.o. intake. EXAM: PORTABLE CHEST 1 VIEW COMPARISON:  Portable chest 11/13/2018 and earlier. FINDINGS: Portable AP semi upright view at 1320 hours. Sequelae of previous thoracic spine corpectomy and posterior fusion. Hardware appears stable. Stable visualized osseous structures. Stable lung volumes and mediastinal contours. Visualized tracheal air column is within normal limits. Chronic increased pulmonary interstitial markings appear stable since 2019. No pneumothorax, pleural effusion or acute pulmonary opacity. Mildly increased bowel gas in the abdomen. Stable cholecystectomy clips. IMPRESSION: Stable.  No acute cardiopulmonary abnormality. Electronically Signed   By: Genevie Ann M.D.   On: 12/29/2018 14:03   Dg Abd Portable 1v  Result Date: 12/30/2018 CLINICAL DATA:  Abdominal pain EXAM: PORTABLE ABDOMEN - 1 VIEW COMPARISON:  CT 12/29/2018 FINDINGS: Surgical  hardware in the lower thoracic spine. Dense aortic atherosclerosis. Mild diffuse increased bowel gas without obstructive pattern. Possible bowel wall thickening at the hepatic flexure and descending colon. No radiopaque calculi. IMPRESSION: Diffuse increased bowel gas without obstructive pattern. There may be bowel wall edema at the hepatic flexure and descending colon as may be seen with colitis Electronically Signed   By: Donavan Foil M.D.   On: 12/30/2018 21:45   Dg Abd Portable 2v  Result Date: 01/01/2019 CLINICAL DATA:  Vomiting. EXAM: PORTABLE ABDOMEN - 2 VIEW COMPARISON:  Radiograph of December 30, 2018. FINDINGS: No small bowel dilatation is noted. Mildly dilated colon loop is seen in right side of abdomen which most likely represents ileus. Status post cholecystectomy. There is no evidence of free air. No radio-opaque calculi or other significant radiographic abnormality is seen. IMPRESSION: Mildly dilated colonic loops seen in right side of abdomen which may represent ileus. Electronically Signed   By: Marijo Conception, M.D.   On: 01/01/2019 14:50     LOS: 8 days   Signature  Lala Lund M.D on 01/06/2019 at 10:42 AM   -  To page go to [email or web address]

## 2019-01-06 NOTE — Progress Notes (Signed)
Nutrition Follow-up  DOCUMENTATION CODES:   Not applicable  INTERVENTION:   Continue Boost Breeze po TID, each supplement provides 250 kcal and 9 grams of protein  Feeding Assistance at meal times  Obtain new weight   NUTRITION DIAGNOSIS:   Inadequate oral intake related to altered GI function as evidenced by (inadequate diet).  Being addressed as diet advanced, taking some po, supplements  GOAL:   Patient will meet greater than or equal to 90% of their needs  Progressing  MONITOR:   Supplement acceptance, Diet advancement  REASON FOR ASSESSMENT:   Malnutrition Screening Tool    ASSESSMENT:   Pt with an PMH of pancreatitis, DM, chronic pain, CVA, SBO 11/2017, GERD, and constipation who was recently hospitalized at [facility] for food poisoning with multiple electrolyte abnormalities who was d/c now admitted with septic shock.    Recorded po intake 68% of meals on average. Recorded po intake 60-75% of meals post diet advancement to Soft. Pt reports she is drinking 3-4 Boost Breeze per day  Pt reports abdominal pain present, reports this is a chronic problem. Likely related to chronic pancreatitis. Pt reports she is taking her Creon. Pt also reports she is not receiving oxycodone pain medicine as much here as she takes at home  Pt placed on GI Soft diet for difficulty chewing. Pt denies any issues chewing current soft diet, reports she can   Pt received bowel regimen for constipation; +severel BMs post intervention  No weight since 2/09. Recommend re-weighing pt.   Labs: potassium 3.2 (L), magnesium 1.5 (L), Creatinine  Meds: KCl, mag sulfate, prednisone   Diet Order:   Diet Order            DIET SOFT Room service appropriate? Yes; Fluid consistency: Thin  Diet effective now        Diet - low sodium heart healthy              EDUCATION NEEDS:   No education needs have been identified at this time  Skin:  Skin Assessment: Skin Integrity Issues: Skin  Integrity Issues:: Stage I Stage I: sacrum  Last BM:  2/17  Height:   Ht Readings from Last 1 Encounters:  12/29/18 5' (1.524 m)    Weight:   Wt Readings from Last 1 Encounters:  12/29/18 50.1 kg    Ideal Body Weight:  45.4 kg  BMI:  Body mass index is 21.57 kg/m.  Estimated Nutritional Needs:   Kcal:  1500-1700 kcals   Protein:  75-85 g  Fluid:  >1.5 L/day   Romelle Starcher MS, RD, LDN, CNSC [phone] Pager  [phone] Weekend/On-Call Pager

## 2019-01-07 LAB — BASIC METABOLIC PANEL
Anion gap: 11 (ref 5–15)
BUN: 7 mg/dL (ref 6–20)
CO2: 25 mmol/L (ref 22–32)
Calcium: 7.4 mg/dL — ABNORMAL LOW (ref 8.9–10.3)
Chloride: 103 mmol/L (ref 98–111)
Creatinine, Ser: 0.88 mg/dL (ref 0.44–1.00)
GFR calc Af Amer: >60 mL/min (ref >60)
GFR calc non Af Amer: >60 mL/min (ref >60)
Glucose, Bld: 506 mg/dL (ref 70–99)
Potassium: 2.9 mmol/L — ABNORMAL LOW (ref 3.5–5.1)
Sodium: 139 mmol/L (ref 135–145)

## 2019-01-07 LAB — GLUCOSE, CAPILLARY
Glucose-Capillary: 322 mg/dL — ABNORMAL HIGH (ref 70–99)
Glucose-Capillary: 253 mg/dL — ABNORMAL HIGH (ref 70–99)
Glucose-Capillary: 282 mg/dL — ABNORMAL HIGH (ref 70–99)
Glucose-Capillary: 292 mg/dL — ABNORMAL HIGH (ref 70–99)

## 2019-01-07 LAB — URINE CULTURE: Culture: >=100000 — AB

## 2019-01-07 LAB — MAGNESIUM: Magnesium: 1.7 mg/dL (ref 1.7–2.4)

## 2019-01-07 LAB — POTASSIUM: POTASSIUM: 3.2 mmol/L — AB (ref 3.5–5.1)

## 2019-01-07 MED ORDER — FUROSEMIDE 40 MG PO TABS
60.0000 mg | ORAL_TABLET | Freq: Once | ORAL | Status: AC
  Administered 2019-01-07: 60 mg via ORAL
  Filled 2019-01-07: qty 1

## 2019-01-07 MED ORDER — POTASSIUM CHLORIDE CRYS ER 20 MEQ PO TBCR
40.0000 meq | EXTENDED_RELEASE_TABLET | Freq: Two times a day (BID) | ORAL | Status: AC
  Administered 2019-01-07 (×2): 40 meq via ORAL
  Filled 2019-01-07 (×2): qty 2

## 2019-01-07 MED ORDER — MAGNESIUM SULFATE IN D5W 1-5 GM/100ML-% IV SOLN
1.0000 g | Freq: Once | INTRAVENOUS | Status: AC
  Administered 2019-01-07: 1 g via INTRAVENOUS
  Filled 2019-01-07: qty 100

## 2019-01-07 MED ORDER — POTASSIUM CHLORIDE CRYS ER 20 MEQ PO TBCR
40.0000 meq | EXTENDED_RELEASE_TABLET | Freq: Once | ORAL | Status: AC
  Administered 2019-01-07: 40 meq via ORAL
  Filled 2019-01-07: qty 2

## 2019-01-07 NOTE — Progress Notes (Signed)
Physical Therapy Treatment Patient Details Name: Amber [name] MRN: [id number] DOB: 23-Jun-1956 Today's Date: 01/07/2019    History of Present Illness 63 y.o. female admitted 12/29/18 for hypotension with recent admission to [facility] for food poisoning and electrolyte abnormalities (d/c home on 12/28/18).  Pt came to ED not feeling well and found to be hyptensive.  PT dx with septic shock due to E coli gastroenteritis with bacteremia, lactic acidosis, hypokalemia, elevated troponin (suspected demand ischemia), mild AKI, and multiple episodes of hypoglycemia (h/o DM and poor po intake).  Pt with other significant PMH of HTN, embolic stroke, focal seizure, ACS, frequent falls and back surgery.     PT Comments    Pt lethargic and declined therex interventions. Pt agreed to chair transfer. Pt presents with stool incontinence. Nursing notified and nurse tech assisted with cleaning and changing of linen. Pt stated her bottom was sore and cream was applied to perianal area to prevent skin breakdown. Pt required max to total assist during bed mobility and rolling. Pt displayed poor seated balance and c/o discomfort and pain throughout duration of tx. Pt is a total assist during sit to stand and pivot transfer. Though pt stated she was SOB her vital remained WNL during and after tx. Pt left clean and with fresh linen applied. Pt chair alarm on with call bell, phone, and needs within reach. Pt discharge plan of SNF is appropriate at this time. Progress with therex as tolerated and agreeable.            Follow Up Recommendations  SNF     Equipment Recommendations  Hospital bed    Recommendations for Other Services       Precautions / Restrictions Precautions Precautions: Fall Restrictions Weight Bearing Restrictions: No    Mobility  Bed Mobility Overal bed mobility: Needs Assistance   Rolling: Total assist;+2 for physical assistance Sidelying to sit: Total assist        General bed mobility comments: Does not assist with bed mobility    Transfers Overall transfer level: Needs assistance Equipment used: None Transfers: Sit to/from BJ'sStand;Stand Pivot Transfers Sit to Stand: Total assist Stand pivot transfers: Total assist       General transfer comment: Pt very weak and requires total assist during sit to stand pivot transfer.  Ambulation/Gait             General Gait Details: unable at this time   Stairs             Wheelchair Mobility    Modified Rankin (Stroke Patients Only)       Balance     Sitting balance-Leahy Scale: Poor(Pt displayed PPT in seated and need for assistance to remain seated without lateral lean and to prevent sliding off EOB) Sitting balance - Comments: Needs mod assist, but can be released for close supervision and pt does kick in some trunk control when let go for ~10-15 seconds before LOB R and posteriorly.                                      Cognition Arousal/Alertness: Lethargic Behavior During Therapy: Restless Overall Cognitive Status: (Not formally assessed)                     Current Attention Level: Selective   Following Commands: Follows one step commands inconsistently;Follows one step commands with increased time Safety/Judgement: Decreased  awareness of safety;Decreased awareness of deficits   Problem Solving: Slow processing;Decreased initiation;Difficulty sequencing;Requires verbal cues;Requires tactile cues General Comments: Pt cognition difficult to assess as she intially refused treatment stating that she wanted to do it in the morning. SPTA relayed to her that it was morning she stated that she needed to move around since the doctor wanted her to get moving. Pt agreed to bed to chair transfer.       Exercises      General Comments        Pertinent Vitals/Pain Pain Assessment: Faces Faces Pain Scale: Hurts a little bit Pain Location: Cream reapplied  to perianal area during cleaning and changing of sheets as pt stated that it was uncomfortable Pain Descriptors / Indicators: Aching;Discomfort;Moaning    Home Living                      Prior Function            PT Goals (current goals can now be found in the care plan section) Acute Rehab PT Goals Patient Stated Goal: to get a warm blanket Potential to Achieve Goals: Fair Progress towards PT goals: Progressing toward goals    Frequency    Min 2X/week      PT Plan Current plan remains appropriate    Co-evaluation              AM-PAC PT "6 Clicks" Mobility   Outcome Measure  Help needed turning from your back to your side while in a flat bed without using bedrails?: Total Help needed moving from lying on your back to sitting on the side of a flat bed without using bedrails?: Total Help needed moving to and from a bed to a chair (including a wheelchair)?: Total Help needed standing up from a chair using your arms (e.g., wheelchair or bedside chair)?: Total Help needed to walk in hospital room?: Total Help needed climbing 3-5 steps with a railing? : Total 6 Click Score: 6    End of Session Equipment Utilized During Treatment: Gait belt Activity Tolerance: Patient limited by pain;Patient limited by fatigue Patient left: in chair;with call bell/phone within reach   PT Visit Diagnosis: Unsteadiness on feet (R26.81);Muscle weakness (generalized) (M62.81);Difficulty in walking, not elsewhere classified (R26.2);Pain Pain - part of body: (back)     Time: 3545-6256 PT Time Calculation (min) (ACUTE ONLY): 26 min  Charges:  $Therapeutic Activity: 23-37 mins                     Margarita Mail, SPTA   Margarita Mail 01/07/2019, 1:55 PM

## 2019-01-07 NOTE — Progress Notes (Signed)
CRITICAL VALUE ALERT  Critical Value:  Glucose on lab work 506  Date & Time Notied:  01/07/19 0556  Provider Notified: Clearence Ped, NP  Orders Received/Actions taken: NP put in order for CBG q4h.

## 2019-01-07 NOTE — Progress Notes (Signed)
PROGRESS NOTE        PATIENT DETAILS Name: Amber [name] Age: 63 y.o. Sex: female Date of Birth: 1956/01/02 Admit Date: 12/29/2018 Admitting Physician Rush Farmer, MD PCP:Sun, Mikeal Hawthorne, MD  Brief Narrative: Patient is a 62 y.o. female with history of chronic diastolic heart failure, PAF on anticoagulation, embolic CVA,  seizure disorder, rheumatoid arthritis, prior back back surgeries, chronic pain syndrome-presented to the ED for hypotension, was found to have septic shock secondary to gram-negative bacteremia.  See below for further details  Subjective: Patient in bed, appears comfortable, denies any headache, no fever, no chest pain or pressure, says her cough and shortness of breath have improved , no abdominal pain. No focal weakness.   Assessment/Plan:  Septic shock secondary to E. coli bacteremia: Sepsis pathophysiology has resolved, continue Rocephin thereafter oral antibiotic transition once she goes home.  Cough-shortness of breath and orthopnea.  Combination of hospital-acquired influenza infection + dCHF.  Placed on Tamiflu, proBNP elevated, stopped IV fluids, is being diuresed with Lasix, symptoms gradually improving, encouraged to sit up in chair, have added  Flutter valve added for pulmonary toiletry along with nebulizer treatments and oxygen for supportive care.    Problem is her extreme frail status, being bedbound virtually 24/7 and inability to clear her pulmonary secretions.  Have been encouraging her daily to sit up in the chair and use flutter valve which so far she has not been doing.  Again reinforced.  Acute on chronic diastolic CHF.  EF 65% on echocardiogram in 2019.  Repeat IV Lasix, monitor electrolytes, continue home dose beta-blocker, encouraged to sit up in chair, oxygen nebulizer treatments PRN.    Abdominal pain: Much improved-she had numerous BMs overnight after she was started on Dulcolax x1, senna/MiraLAX.  She has chronic  abdominal pain at baseline from chronic pancreatitis.    Lactic acidosis: Secondary to sepsis, has normalized.  Elevated troponin: Trend is flat-not consistent with ACS-likely demand ischemia.  EKG without acute changes.  Given poor overall health-bedbound status-best served by conservative management.  PAF: Sinus rhythm-has prior history of embolic stroke-continue Eliquis.  Chronic thrombocytopenia: Unclear etiology-platelets close to usual baseline.  Follow  Anemia: Suspect has chronic anemia at baseline-worsened by acute illness, has required 1 unit of PRBC transfusion during this hospital stay.  Hemoglobin low but stable-follow.  History of rheumatoid arthritis with chronic steroid use: Appears to be on chronic prednisone-currently on stress dose IV Solu-Cortef-we will slowly taper over the next few days.  Chronic pancreatitis: Continue Creon  History of seizures: Continue Depakote  History of CVA: Reviewed records in care everywhere-she has had prior embolic strokes-difficult neurologic exam due to lethargy/weakness-but appears nonfocal-on Eliquis  Generalized deconditioning/worsening debility: Per patient-she has not ambulated in 4 years-mostly bed to wheelchair bound.  She is going to talk to her family about SNF rehab-as per social work-her family wants to take her home.    Chronic pain syndrome (back pain, neck pain, abdominal pain): Continue prn oxycodone-await x-ray abdomen-may need to be started on a bowel regimen.  Monitor on low-dose narcotics with caution.  Has tendency to accidentally overdose.  Hypokalemia and hypomagnesemia.  Both replaced will continue to monitor.   DVT Prophylaxis: Full dose anticoagulation with Eliquis  Code Status: Full code  Family Communication: None at bedside  Disposition Plan: Remain inpatient-hopefully discharge tomorrow if clinical improvement continues.  Antimicrobial  agents: Anti-infectives (From admission, onward)   Start      Dose/Rate Route Frequency Ordered Stop   01/05/19 1000  oseltamivir (TAMIFLU) capsule 30 mg     30 mg Oral 2 times daily 01/05/19 0826 01/10/19 0959   01/04/19 1500  cefTRIAXone (ROCEPHIN) 2 g in sodium chloride 0.9 % 100 mL IVPB  Status:  Discontinued     2 g 200 mL/hr over 30 Minutes Intravenous Every 24 hours 01/04/19 1416 01/07/19 1049   01/04/19 1500  metroNIDAZOLE (FLAGYL) IVPB 500 mg  Status:  Discontinued     500 mg 100 mL/hr over 60 Minutes Intravenous Every 8 hours 01/04/19 1416 01/07/19 1049   01/03/19 1030  sulfamethoxazole-trimethoprim (BACTRIM DS,SEPTRA DS) 800-160 MG per tablet 1 tablet  Status:  Discontinued     1 tablet Oral Every 12 hours 01/03/19 1019 01/04/19 1416   01/03/19 0000  sulfamethoxazole-trimethoprim (BACTRIM DS,SEPTRA DS) 800-160 MG tablet     1 tablet Oral Every 12 hours 01/03/19 1059     12/31/18 1330  vancomycin (VANCOCIN) IVPB 750 mg/150 ml premix  Status:  Discontinued     750 mg 150 mL/hr over 60 Minutes Intravenous Every 48 hours 12/29/18 1738 12/30/18 1127   12/30/18 1200  cefTRIAXone (ROCEPHIN) 2 g in sodium chloride 0.9 % 100 mL IVPB  Status:  Discontinued     2 g 200 mL/hr over 30 Minutes Intravenous Every 24 hours 12/30/18 1127 01/03/19 1019   12/30/18 0130  ceFEPIme (MAXIPIME) 1 g in sodium chloride 0.9 % 100 mL IVPB  Status:  Discontinued     1 g 200 mL/hr over 30 Minutes Intravenous Every 12 hours 12/29/18 1750 12/30/18 1127   12/29/18 1715  ceFEPIme (MAXIPIME) 2 g in sodium chloride 0.9 % 100 mL IVPB  Status:  Discontinued     2 g 200 mL/hr over 30 Minutes Intravenous  Once 12/29/18 1706 12/29/18 1750   12/29/18 1715  metroNIDAZOLE (FLAGYL) IVPB 500 mg  Status:  Discontinued     500 mg 100 mL/hr over 60 Minutes Intravenous Every 8 hours 12/29/18 1706 12/30/18 1127   12/29/18 1715  vancomycin (VANCOCIN) IVPB 1000 mg/200 mL premix  Status:  Discontinued     1,000 mg 200 mL/hr over 60 Minutes Intravenous  Once 12/29/18 1706 12/29/18 1750    12/29/18 1230  ceFEPIme (MAXIPIME) 2 g in sodium chloride 0.9 % 100 mL IVPB     2 g 200 mL/hr over 30 Minutes Intravenous  Once 12/29/18 1226 12/29/18 1424   12/29/18 1230  metroNIDAZOLE (FLAGYL) IVPB 500 mg  Status:  Discontinued     500 mg 100 mL/hr over 60 Minutes Intravenous Every 8 hours 12/29/18 1226 12/29/18 1706   12/29/18 1230  vancomycin (VANCOCIN) IVPB 1000 mg/200 mL premix     1,000 mg 200 mL/hr over 60 Minutes Intravenous  Once 12/29/18 1226 12/29/18 1551      Procedures: None  CONSULTS:  None  Time spent: 25- minutes-Greater than 50% of this time was spent in counseling, explanation of diagnosis, planning of further management, and coordination of care.  MEDICATIONS: Scheduled Meds: . allopurinol  300 mg Oral Daily  . apixaban  2.5 mg Oral BID  . feeding supplement  1 Container Oral TID BM  . furosemide  60 mg Oral Once  . lipase/protease/amylase  36,000 Units Oral TID AC  . mouth rinse  15 mL Mouth Rinse BID  . metoprolol succinate  25 mg Oral Daily  . oseltamivir  30  mg Oral BID  . pantoprazole  40 mg Oral BID  . polyethylene glycol  17 g Oral BID  . potassium chloride  40 mEq Oral BID  . predniSONE  5 mg Oral Q breakfast  . senna  2 tablet Oral QHS  . valproic acid  250 mg Oral TID   Continuous Infusions: . magnesium sulfate 1 - 4 g bolus IVPB     PRN Meds:.acetaminophen, diltiazem, hydrOXYzine, ipratropium-albuterol, naLOXone (NARCAN)  injection, oxyCODONE, simethicone   PHYSICAL EXAM: Vital signs: Vitals:   01/07/19 0155 01/07/19 0504 01/07/19 0505 01/07/19 0748  BP: (!) 140/91  (!) 143/89   Pulse: 92  87   Resp: (!) 34  (!) 24   Temp:  97.9 F (36.6 C)  (!) 97.5 F (36.4 C)  TempSrc:  Oral  Oral  SpO2: 100%  100%   Weight:      Height:       Filed Weights   12/29/18 1229 12/29/18 2040  Weight: 40.5 kg 50.1 kg   Body mass index is 21.57 kg/m.   Exam  Awake Alert, overall extremely frail and cachectic African-American female who  appears much older than her stated age, [place].AT,PERRAL Supple Neck,No JVD, No cervical lymphadenopathy appriciated.  Symmetrical Chest wall movement, Good air movement bilaterally, coarse bilateral rails RRR,No Gallops, Rubs or new Murmurs, No Parasternal Heave +ve B.Sounds, Abd Soft, No tenderness, No organomegaly appriciated, No rebound - guarding or rigidity. No Cyanosis, Clubbing or edema, No new Rash or bruise    I have personally reviewed following labs and imaging studies  LABORATORY DATA: CBC: Recent Labs  Lab 01/01/19 0953 01/02/19 0700 01/05/19 0619 01/06/19 0432  WBC 19.8* 15.4* 6.4 5.0  HGB 9.5* 9.1* 7.7* 8.3*  HCT 28.5* 25.7* 23.2* 23.2*  MCV 79.4* 77.6* 79.5* 76.8*  PLT 69* 62* 88* 73*    Basic Metabolic Panel: Recent Labs  Lab 01/01/19 0953  01/03/19 0455  01/04/19 0057 01/04/19 0407 01/05/19 0619 01/06/19 0846 01/07/19 0434  NA 144   < > 143   < > 141 142 142 137 139  K 4.1   < > 2.7*   < > 3.7 4.5 3.6 3.2* 2.9*  CL 119*   < > 111   < > 108 112* 113* 107 103  CO2 17*   < > 21*   < > 23 22 21* 20* 25  GLUCOSE 136*   < > 121*   < > 111* 76 100* 424* 506*  BUN 11   < > 6   < > 5* 6 <5* 8 7  CREATININE 0.95   < > 0.87   < > 0.78 0.81 0.78 0.86 0.88  CALCIUM 7.9*   < > 7.3*   < > 7.4* 7.4* 7.4* 7.4* 7.4*  MG 1.9  --  1.6*  --   --  2.1  --  1.5* 1.7  PHOS 3.2  --   --   --   --   --   --   --   --    < > = values in this interval not displayed.    GFR: Estimated Creatinine Clearance: 48.8 mL/min (by C-G formula based on SCr of 0.88 mg/dL).  Liver Function Tests: Recent Labs  Lab 01/01/19 0953  AST 41  ALT 24  ALKPHOS 171*  BILITOT 0.6  PROT 5.0*  ALBUMIN 1.8*   No results for input(s): LIPASE, AMYLASE in the last 168 hours. Recent Labs  Lab 01/01/19  2297  AMMONIA 53*    Coagulation Profile: No results for input(s): INR, PROTIME in the last 168 hours.  Cardiac Enzymes: Recent Labs  Lab 12/31/18 1632 01/01/19 0953  TROPONINI 0.20*  0.34*    BNP (last 3 results) No results for input(s): PROBNP in the last 8760 hours.  HbA1C: No results for input(s): HGBA1C in the last 72 hours.  CBG: Recent Labs  Lab 01/01/19 0015 01/01/19 0349 01/01/19 0809 01/01/19 1256 01/07/19 0746  GLUCAP 144* 146* 126* 159* 322*    Lipid Profile: No results for input(s): CHOL, HDL, LDLCALC, TRIG, CHOLHDL, LDLDIRECT in the last 72 hours.  Thyroid Function Tests: No results for input(s): TSH, T4TOTAL, FREET4, T3FREE, THYROIDAB in the last 72 hours.  Anemia Panel: No results for input(s): VITAMINB12, FOLATE, FERRITIN, TIBC, IRON, RETICCTPCT in the last 72 hours.  Urine analysis:    Component Value Date/Time   COLORURINE YELLOW 01/04/2019 2048   APPEARANCEUR HAZY (A) 01/04/2019 2048   LABSPEC 1.015 01/04/2019 2048   PHURINE 7.0 01/04/2019 2048   GLUCOSEU NEGATIVE 01/04/2019 2048   HGBUR SMALL (A) 01/04/2019 2048   BILIRUBINUR NEGATIVE 01/04/2019 2048   KETONESUR NEGATIVE 01/04/2019 2048   PROTEINUR 30 (A) 01/04/2019 2048   UROBILINOGEN 1.0 07/27/2015 1052   NITRITE NEGATIVE 01/04/2019 2048   LEUKOCYTESUR NEGATIVE 01/04/2019 2048    Sepsis Labs: Lactic Acid, Venous    Component Value Date/Time   LATICACIDVEN 1.6 01/01/2019 0953    MICROBIOLOGY: Recent Results (from the past 240 hour(s))  Blood Culture (routine x 2)     Status: None   Collection Time: 12/29/18 12:23 PM  Result Value Ref Range Status   Specimen Description BLOOD RIGHT FOREARM  Final   Special Requests   Final    BOTTLES DRAWN AEROBIC AND ANAEROBIC Blood Culture adequate volume Performed at [facility], [place] [address] [place], [place] [address]    Culture   Final    NO GROWTH 5 DAYS Performed at [facility], [place] [address], [place], [place] [address]    Report Status 01/03/2019 FINAL  Final  Urine culture     Status: Abnormal   Collection Time: 12/29/18 12:24 PM  Result Value Ref Range Status   Specimen Description    Final    URINE, RANDOM Performed at [facility] [address], [place], [place] [address]    Special Requests   Final    NONE Performed at [facility], [place] [address] [place], [place] [address]    Culture MULTIPLE SPECIES PRESENT, SUGGEST RECOLLECTION (A)  Final   Report Status 12/31/2018 FINAL  Final  Blood Culture (routine x 2)     Status: Abnormal   Collection Time: 12/29/18 12:28 PM  Result Value Ref Range Status   Specimen Description BLOOD RIGHT ARM  Final   Special Requests   Final    BOTTLES DRAWN AEROBIC AND ANAEROBIC Blood Culture adequate volume Performed at [facility] [address], [place], [place] [address]    Culture  Setup Time   Final    GRAM NEGATIVE RODS ANAEROBIC BOTTLE ONLY Organism ID to follow CRITICAL RESULT CALLED TO, READ BACK BY AND VERIFIED WITH: Jene Every PharmD 9:45 12/30/18 ([email or web address]) Performed at [facility], [address], [place], [place] [address]    Culture ESCHERICHIA COLI (A)  Final   Report Status 01/01/2019 FINAL  Final   Organism ID, Bacteria ESCHERICHIA COLI  Final  Susceptibility   Escherichia coli - MIC*    AMPICILLIN >=32 RESISTANT Resistant     CEFAZOLIN 32 INTERMEDIATE Intermediate     CEFEPIME <=1 SENSITIVE Sensitive     CEFTAZIDIME <=1 SENSITIVE Sensitive     CEFTRIAXONE <=1 SENSITIVE Sensitive     CIPROFLOXACIN >=4 RESISTANT Resistant     GENTAMICIN <=1 SENSITIVE Sensitive     IMIPENEM <=0.25 SENSITIVE Sensitive     TRIMETH/SULFA <=20 SENSITIVE Sensitive     AMPICILLIN/SULBACTAM >=32 RESISTANT Resistant     PIP/TAZO >=128 RESISTANT Resistant     Extended ESBL NEGATIVE Sensitive     * ESCHERICHIA COLI  Blood Culture ID Panel (Reflexed)     Status: Abnormal   Collection Time: 12/29/18 12:28 PM  Result Value Ref Range Status   Enterococcus species NOT DETECTED NOT DETECTED Final   Listeria monocytogenes NOT DETECTED NOT DETECTED Final    Staphylococcus species NOT DETECTED NOT DETECTED Final   Staphylococcus aureus (BCID) NOT DETECTED NOT DETECTED Final   Streptococcus species NOT DETECTED NOT DETECTED Final   Streptococcus agalactiae NOT DETECTED NOT DETECTED Final   Streptococcus pneumoniae NOT DETECTED NOT DETECTED Final   Streptococcus pyogenes NOT DETECTED NOT DETECTED Final   Acinetobacter baumannii NOT DETECTED NOT DETECTED Final   Enterobacteriaceae species DETECTED (A) NOT DETECTED Final    Comment: Enterobacteriaceae represent a large family of gram-negative bacteria, not a single organism. CRITICAL RESULT CALLED TO, READ BACK BY AND VERIFIED WITH: Jene Every PharmD 9:45 12/30/18 ([email or web address])    Enterobacter cloacae complex NOT DETECTED NOT DETECTED Final   Escherichia coli DETECTED (A) NOT DETECTED Final    Comment: CRITICAL RESULT CALLED TO, READ BACK BY AND VERIFIED WITH: Jene Every PharmD 9:45 12/30/18 ([email or web address])    Klebsiella oxytoca NOT DETECTED NOT DETECTED Final   Klebsiella pneumoniae NOT DETECTED NOT DETECTED Final   Proteus species NOT DETECTED NOT DETECTED Final   Serratia marcescens NOT DETECTED NOT DETECTED Final   Carbapenem resistance NOT DETECTED NOT DETECTED Final   Haemophilus influenzae NOT DETECTED NOT DETECTED Final   Neisseria meningitidis NOT DETECTED NOT DETECTED Final   Pseudomonas aeruginosa NOT DETECTED NOT DETECTED Final   Candida albicans NOT DETECTED NOT DETECTED Final   Candida glabrata NOT DETECTED NOT DETECTED Final   Candida krusei NOT DETECTED NOT DETECTED Final   Candida parapsilosis NOT DETECTED NOT DETECTED Final   Candida tropicalis NOT DETECTED NOT DETECTED Final    Comment: Performed at [facility], [place] [address] [place], [place] [address]  MRSA PCR Screening     Status: None   Collection Time: 12/29/18  8:36 PM  Result Value Ref Range Status   MRSA by PCR NEGATIVE NEGATIVE Final    Comment:        The GeneXpert MRSA Assay (FDA approved for NASAL specimens only),  is one component of a comprehensive MRSA colonization surveillance program. It is not intended to diagnose MRSA infection nor to guide or monitor treatment for MRSA infections. Performed at [facility], [place] [address], [place], [place] [address]   Gastrointestinal Panel by PCR , Stool     Status: None   Collection Time: 12/30/18 10:17 PM  Result Value Ref Range Status   Campylobacter species NOT DETECTED NOT DETECTED Final   Plesimonas shigelloides NOT DETECTED NOT DETECTED Final   Salmonella species NOT DETECTED NOT DETECTED Final   Yersinia enterocolitica NOT DETECTED NOT DETECTED Final   Vibrio species NOT DETECTED NOT DETECTED Final  Vibrio cholerae NOT DETECTED NOT DETECTED Final   Enteroaggregative E coli (EAEC) NOT DETECTED NOT DETECTED Final   Enteropathogenic E coli (EPEC) NOT DETECTED NOT DETECTED Final   Enterotoxigenic E coli (ETEC) NOT DETECTED NOT DETECTED Final   Shiga like toxin producing E coli (STEC) NOT DETECTED NOT DETECTED Final   Shigella/Enteroinvasive E coli (EIEC) NOT DETECTED NOT DETECTED Final   Cryptosporidium NOT DETECTED NOT DETECTED Final   Cyclospora cayetanensis NOT DETECTED NOT DETECTED Final   Entamoeba histolytica NOT DETECTED NOT DETECTED Final   Giardia lamblia NOT DETECTED NOT DETECTED Final   Adenovirus F40/41 NOT DETECTED NOT DETECTED Final   Astrovirus NOT DETECTED NOT DETECTED Final   Norovirus GI/GII NOT DETECTED NOT DETECTED Final   Rotavirus A NOT DETECTED NOT DETECTED Final   Sapovirus (I, II, IV, and V) NOT DETECTED NOT DETECTED Final    Comment: Performed at [facility], [address] [place], [place] [address]  Stool culture (children & immunocomp patients)     Status: None   Collection Time: 12/30/18 10:17 PM  Result Value Ref Range Status   Salmonella/Shigella Screen Final report  Final   Campylobacter Culture Final report  Final   E coli, Shiga toxin Assay Negative Negative Final    Comment:  (NOTE) Performed At: [facility] [place], [place] [id number] Rush Farmer MD [id number]   STOOL CULTURE REFLEX - RSASHR     Status: None   Collection Time: 12/30/18 10:17 PM  Result Value Ref Range Status   Stool Culture result 1 (RSASHR) Comment  Final    Comment: (NOTE) No Salmonella or Shigella recovered. Performed At: [facility] [address] [place], [place] [id number] Rush Farmer MD [id number]   STOOL CULTURE Reflex - CMPCXR     Status: None   Collection Time: 12/30/18 10:17 PM  Result Value Ref Range Status   Stool Culture result 1 (CMPCXR) Comment  Final    Comment: (NOTE) No Campylobacter species isolated. Performed At: [facility] [place], [place] [id number] Rush Farmer MD PI:[id number]   Culture, blood (routine x 2)     Status: None (Preliminary result)   Collection Time: 01/04/19  6:38 PM  Result Value Ref Range Status   Specimen Description BLOOD LEFT HAND  Final   Special Requests   Final    BOTTLES DRAWN AEROBIC ONLY Blood Culture adequate volume   Culture   Final    NO GROWTH 2 DAYS Performed at [facility], [address], [place], [place] [address]    Report Status PENDING  Incomplete  Culture, blood (routine x 2)     Status: None (Preliminary result)   Collection Time: 01/04/19  6:38 PM  Result Value Ref Range Status   Specimen Description BLOOD RIGHT HAND  Final   Special Requests   Final    BOTTLES DRAWN AEROBIC ONLY Blood Culture results may not be optimal due to an excessive volume of blood received in culture bottles   Culture   Final    NO GROWTH 2 DAYS Performed at [facility], [place] [address] [place], [place] [address]    Report Status PENDING  Incomplete  Culture, Urine     Status: Abnormal   Collection Time: 01/04/19  8:48 PM  Result Value Ref Range Status   Specimen Description URINE, RANDOM  Final   Special Requests   Final    NONE Performed at  [facility],  [address], [place], [place] [address]    Culture (A)  Final    >=100,000 COLONIES/mL VANCOMYCIN RESISTANT ENTEROCOCCUS ISOLATED   Report Status 01/07/2019 FINAL  Final   Organism ID, Bacteria VANCOMYCIN RESISTANT ENTEROCOCCUS ISOLATED (A)  Final      Susceptibility   Vancomycin resistant enterococcus isolated - MIC*    AMPICILLIN >=32 RESISTANT Resistant     LEVOFLOXACIN >=8 RESISTANT Resistant     NITROFURANTOIN 32 SENSITIVE Sensitive     VANCOMYCIN >=32 RESISTANT Resistant     LINEZOLID 2 SENSITIVE Sensitive     * >=100,000 COLONIES/mL VANCOMYCIN RESISTANT ENTEROCOCCUS ISOLATED    RADIOLOGY STUDIES/RESULTS: Ct Abdomen Pelvis Wo Contrast  Result Date: 12/29/2018 CLINICAL DATA:  Abdomen distension two days ago here for food poisoning Allergic to iv contrast no oral contrast per MD EXAM: CT ABDOMEN AND PELVIS WITHOUT CONTRAST TECHNIQUE: Multidetector CT imaging of the abdomen and pelvis was performed following the standard protocol without IV contrast. COMPARISON:  12/27/2018 FINDINGS: Lower chest: There is bibasilar atelectasis. Hepatobiliary: The liver is homogeneous. Status post cholecystectomy. Pancreas: Pancreas is atrophic. Coarse calcifications throughout the pancreatic body and tail suggests history of chronic pancreatitis. Spleen: Normal in size without focal abnormality. Adrenals/Urinary Tract: Adrenal glands are unremarkable. Kidneys are normal, without renal calculi, focal lesion, or hydronephrosis. Bladder is unremarkable. Stomach/Bowel: Stomach is unremarkable. Small bowel loops are normal in appearance. There are numerous colonic diverticula. No acute diverticulitis. Significant stool within the rectosigmoid colon. There has been some improvement in the appearance of thickening of the rectosigmoid colon. No perirectal abscess. Vascular/Lymphatic: There is dense atherosclerotic calcification of the abdominal aorta not associated with aneurysm. No  retroperitoneal or mesenteric adenopathy. Reproductive: The uterus is present. Multiple mural calcifications are present. No adnexal mass. Other: No ascites. No abdominal wall hernia. Note is made of fat within the LEFT inguinal ring, likely a lipoma. Diffuse body wall edema. Musculoskeletal: Remote wedge compression fractures of T12, L1, and L3. There is 2 millimeters anterolisthesis of L4 on L5. Degenerative changes are identified at L4-5 and L5-S1. No suspicious lytic or blastic lesions are identified. IMPRESSION: 1. Significant stool within the rectosigmoid colon. There has been some improvement in the appearance of thickening of the rectosigmoid colon. 2. Diverticulosis without acute diverticulitis. 3. Status post cholecystectomy. 4. Changes of chronic pancreatitis. 5. Remote wedge compression fractures of T12, L1, and L3. Aortic Atherosclerosis (ICD10-I70.0). Electronically Signed   By: Nolon Nations M.D.   On: 12/29/2018 13:12   Dg Chest Port 1 View  Result Date: 01/06/2019 CLINICAL DATA:  Cough and congestion. EXAM: PORTABLE CHEST 1 VIEW COMPARISON:  January 05, 2019 FINDINGS: Stable cardiomegaly. Stable small pleural effusions. Increasing pulmonary opacities suggestive of worsening edema. Persistent lytic lesion in the proximal right humerus. IMPRESSION: 1. Cardiomegaly and worsening pulmonary edema.  Small effusions. 2. Persistent lytic lesion in the proximal right humerus. Gout was suggested on previous right shoulder x-rays. Electronically Signed   By: Dorise Bullion III M.D   On: 01/06/2019 09:09   Dg Chest Port 1 View  Result Date: 01/05/2019 CLINICAL DATA:  Five day history of shortness of breath. Acute mid chest pain that began today. EXAM: PORTABLE CHEST 1 VIEW COMPARISON:  01/04/2019 and earlier. FINDINGS: Cardiac silhouette moderately enlarged, unchanged. Increasing interstitial opacities superimposed upon the previously identified interstitial lung disease. Stable small BILATERAL  pleural effusions. Prior thoracic spine corpectomy and fusion. Lytic lesion in the proximal RIGHT humerus. Severe degenerative changes involving both shoulders. IMPRESSION: 1. Developing interstitial  pulmonary edema is suspected, superimposed upon the previously identified interstitial lung disease. 2. Stable small BILATERAL pleural effusions. 3. Lytic lesion involving the proximal RIGHT humerus. Does the patient have a known malignancy or multiple myeloma? Electronically Signed   By: Evangeline Dakin M.D.   On: 01/05/2019 08:58   Dg Chest Port 1 View  Result Date: 01/04/2019 CLINICAL DATA:  Difficulty breathing EXAM: PORTABLE CHEST 1 VIEW COMPARISON:  December 29, 2018; November 13, 2018 FINDINGS: Diffuse interstitial thickening remains stable. There is no frank edema consolidation. Heart is upper normal in size with pulmonary vascularity normal. No adenopathy. Extensive postoperative change in the thoracic spine noted. Evidence of old right-sided rib trauma laterally. There is arthropathy in the shoulders, stable. Cystic area in the proximal right humerus is stable. IMPRESSION: Diffuse interstitial thickening, likely reflecting chronic fibrosis. No frank edema or consolidation evident. Stable cardiac silhouette. Extensive postoperative change in the thoracic spine. Advanced arthropathy in the shoulders. Prior rib trauma on the right. Electronically Signed   By: Lowella Grip III M.D.   On: 01/04/2019 08:51   Dg Chest Port 1 View  Result Date: 12/29/2018 CLINICAL DATA:  63 year old female with altered mental status, fever, decreased p.o. intake. EXAM: PORTABLE CHEST 1 VIEW COMPARISON:  Portable chest 11/13/2018 and earlier. FINDINGS: Portable AP semi upright view at 1320 hours. Sequelae of previous thoracic spine corpectomy and posterior fusion. Hardware appears stable. Stable visualized osseous structures. Stable lung volumes and mediastinal contours. Visualized tracheal air column is within normal  limits. Chronic increased pulmonary interstitial markings appear stable since 2019. No pneumothorax, pleural effusion or acute pulmonary opacity. Mildly increased bowel gas in the abdomen. Stable cholecystectomy clips. IMPRESSION: Stable.  No acute cardiopulmonary abnormality. Electronically Signed   By: Genevie Ann M.D.   On: 12/29/2018 14:03   Dg Abd Portable 1v  Result Date: 12/30/2018 CLINICAL DATA:  Abdominal pain EXAM: PORTABLE ABDOMEN - 1 VIEW COMPARISON:  CT 12/29/2018 FINDINGS: Surgical hardware in the lower thoracic spine. Dense aortic atherosclerosis. Mild diffuse increased bowel gas without obstructive pattern. Possible bowel wall thickening at the hepatic flexure and descending colon. No radiopaque calculi. IMPRESSION: Diffuse increased bowel gas without obstructive pattern. There may be bowel wall edema at the hepatic flexure and descending colon as may be seen with colitis Electronically Signed   By: Donavan Foil M.D.   On: 12/30/2018 21:45   Dg Abd Portable 2v  Result Date: 01/01/2019 CLINICAL DATA:  Vomiting. EXAM: PORTABLE ABDOMEN - 2 VIEW COMPARISON:  Radiograph of December 30, 2018. FINDINGS: No small bowel dilatation is noted. Mildly dilated colon loop is seen in right side of abdomen which most likely represents ileus. Status post cholecystectomy. There is no evidence of free air. No radio-opaque calculi or other significant radiographic abnormality is seen. IMPRESSION: Mildly dilated colonic loops seen in right side of abdomen which may represent ileus. Electronically Signed   By: Marijo Conception, M.D.   On: 01/01/2019 14:50     LOS: 9 days   Signature  Lala Lund M.D on 01/07/2019 at 10:52 AM   -  To page go to [email or web address]

## 2019-01-08 ENCOUNTER — Inpatient Hospital Stay (HOSPITAL_COMMUNITY): Payer: Medicaid Other

## 2019-01-08 LAB — BASIC METABOLIC PANEL
Anion gap: 9 (ref 5–15)
BUN: 6 mg/dL (ref 6–20)
CO2: 26 mmol/L (ref 22–32)
Calcium: 7.6 mg/dL — ABNORMAL LOW (ref 8.9–10.3)
Chloride: 103 mmol/L (ref 98–111)
Creatinine, Ser: 0.77 mg/dL (ref 0.44–1.00)
GFR calc Af Amer: >60 mL/min (ref >60)
GFR calc non Af Amer: >60 mL/min (ref >60)
Glucose, Bld: 253 mg/dL — ABNORMAL HIGH (ref 70–99)
Potassium: 5.6 mmol/L — ABNORMAL HIGH (ref 3.5–5.1)
Sodium: 138 mmol/L (ref 135–145)

## 2019-01-08 LAB — CBC
HCT: 25 % — ABNORMAL LOW (ref 36.0–46.0)
Hemoglobin: 8.5 g/dL — ABNORMAL LOW (ref 12.0–15.0)
MCH: 26.2 pg (ref 26.0–34.0)
MCHC: 34 g/dL (ref 30.0–36.0)
MCV: 76.9 fL — AB (ref 80.0–100.0)
Platelets: 136 10*3/uL — ABNORMAL LOW (ref 150–400)
RBC: 3.25 MIL/uL — ABNORMAL LOW (ref 3.87–5.11)
RDW: 18.6 % — ABNORMAL HIGH (ref 11.5–15.5)
WBC: 8.9 10*3/uL (ref 4.0–10.5)
nRBC: 0.2 % (ref 0.0–0.2)

## 2019-01-08 LAB — GLUCOSE, CAPILLARY
GLUCOSE-CAPILLARY: 166 mg/dL — AB (ref 70–99)
Glucose-Capillary: 128 mg/dL — ABNORMAL HIGH (ref 70–99)
Glucose-Capillary: 146 mg/dL — ABNORMAL HIGH (ref 70–99)
Glucose-Capillary: 148 mg/dL — ABNORMAL HIGH (ref 70–99)
Glucose-Capillary: 219 mg/dL — ABNORMAL HIGH (ref 70–99)
Glucose-Capillary: 419 mg/dL — ABNORMAL HIGH (ref 70–99)

## 2019-01-08 LAB — HEMOGLOBIN A1C
Hgb A1c MFr Bld: 5.1 % (ref 4.8–5.6)
Mean Plasma Glucose: 100 mg/dL

## 2019-01-08 LAB — POTASSIUM: Potassium: 4.9 mmol/L (ref 3.5–5.1)

## 2019-01-08 LAB — MAGNESIUM: Magnesium: 1.6 mg/dL — ABNORMAL LOW (ref 1.7–2.4)

## 2019-01-08 MED ORDER — MAGNESIUM SULFATE 2 GM/50ML IV SOLN
2.0000 g | Freq: Once | INTRAVENOUS | Status: AC
  Administered 2019-01-08: 2 g via INTRAVENOUS
  Filled 2019-01-08: qty 50

## 2019-01-08 MED ORDER — MAGNESIUM SULFATE IN D5W 1-5 GM/100ML-% IV SOLN
1.0000 g | Freq: Once | INTRAVENOUS | Status: AC
  Administered 2019-01-08: 1 g via INTRAVENOUS
  Filled 2019-01-08: qty 100

## 2019-01-08 MED ORDER — FUROSEMIDE 40 MG PO TABS
60.0000 mg | ORAL_TABLET | Freq: Once | ORAL | Status: AC
  Administered 2019-01-08: 60 mg via ORAL
  Filled 2019-01-08: qty 1

## 2019-01-08 MED ORDER — SULFAMETHOXAZOLE-TRIMETHOPRIM 800-160 MG PO TABS
1.0000 | ORAL_TABLET | Freq: Two times a day (BID) | ORAL | Status: AC
  Administered 2019-01-08 – 2019-01-09 (×4): 1 via ORAL
  Filled 2019-01-08 (×4): qty 1

## 2019-01-08 MED ORDER — FUROSEMIDE 10 MG/ML IJ SOLN
60.0000 mg | Freq: Once | INTRAMUSCULAR | Status: AC
  Administered 2019-01-08: 60 mg via INTRAVENOUS
  Filled 2019-01-08: qty 6

## 2019-01-08 MED ORDER — METOLAZONE 2.5 MG PO TABS
2.5000 mg | ORAL_TABLET | Freq: Once | ORAL | Status: AC
  Administered 2019-01-08: 2.5 mg via ORAL
  Filled 2019-01-08: qty 1

## 2019-01-08 MED ORDER — ZOLPIDEM TARTRATE 5 MG PO TABS
5.0000 mg | ORAL_TABLET | Freq: Once | ORAL | Status: AC
  Administered 2019-01-09: 5 mg via ORAL
  Filled 2019-01-08: qty 1

## 2019-01-08 MED ORDER — FOSFOMYCIN TROMETHAMINE 3 G PO PACK
3.0000 g | PACK | Freq: Once | ORAL | Status: AC
  Administered 2019-01-08: 3 g via ORAL
  Filled 2019-01-08: qty 3

## 2019-01-08 NOTE — Plan of Care (Signed)
Pt continues to be very weak and crying out consistently asking for pain medications.  Problem: Education: Goal: Knowledge of General Education information will improve Description Including pain rating scale, medication(s)/side effects and non-pharmacologic comfort measures Outcome: Not Progressing   Problem: Health Behavior/Discharge Planning: Goal: Ability to manage health-related needs will improve Outcome: Not Progressing   Problem: Clinical Measurements: Goal: Ability to maintain clinical measurements within normal limits will improve Outcome: Not Progressing Goal: Will remain free from infection Outcome: Not Progressing Goal: Diagnostic test results will improve Outcome: Not Progressing Goal: Respiratory complications will improve Outcome: Not Progressing Goal: Cardiovascular complication will be avoided Outcome: Not Progressing   Problem: Activity: Goal: Risk for activity intolerance will decrease Outcome: Not Progressing   Problem: Nutrition: Goal: Adequate nutrition will be maintained Outcome: Not Progressing   Problem: Coping: Goal: Level of anxiety will decrease Outcome: Not Progressing   Problem: Elimination: Goal: Will not experience complications related to bowel motility Outcome: Not Progressing Goal: Will not experience complications related to urinary retention Outcome: Not Progressing   Problem: Pain Managment: Goal: General experience of comfort will improve Outcome: Not Progressing   Problem: Safety: Goal: Ability to remain free from injury will improve Outcome: Not Progressing   Problem: Skin Integrity: Goal: Risk for impaired skin integrity will decrease Outcome: Not Progressing

## 2019-01-08 NOTE — Progress Notes (Signed)
PROGRESS NOTE        PATIENT DETAILS Name: Amber [name] Age: 63 y.o. Sex: female Date of Birth: 1956-01-29 Admit Date: 12/29/2018 Admitting Physician Rush Farmer, MD PCP:Sun, Mikeal Hawthorne, MD  Brief Narrative:  Patient is a 63 y.o. female with history of chronic diastolic heart failure, PAF on anticoagulation, embolic CVA,  seizure disorder, rheumatoid arthritis, prior back back surgeries, chronic pain syndrome-presented to the ED for hypotension, was found to have septic shock secondary to gram-negative bacteremia.  See below for further details  Subjective: Patient in bed, appears comfortable, denies any headache, no fever, no chest pain or pressure, no shortness of breath , no abdominal pain. No focal weakness.  Assessment/Plan:  Septic shock secondary to E. coli bacteremia: Sepsis pathophysiology has resolved, was initially on Rocephin now Bactrim.  Continue total 10-day course.  Stop date will be 01/09/2019.  Cough-shortness of breath and orthopnea.  Combination of hospital-acquired influenza infection + dCHF.  Placed on Tamiflu, proBNP elevated, stopped IV fluids, is being diuresed with Lasix, symptoms gradually improving, encouraged to sit up in chair, have added  Flutter valve added for pulmonary toiletry along with nebulizer treatments and oxygen for supportive care.    Problem is her extreme frail status, being bedbound virtually 24/7 and inability to clear her pulmonary secretions.  Have been encouraging her daily to sit up in the chair and use flutter valve which so far she has not been doing.  Again reinforced.  Her long-term prognosis appears very poor due to her underlying extremely frail status however patient and family continue to have unreasonable expectations.  Also seen by palliative care.  Want to be full code.  ESBL enterococcus UTI.  More likely colonization.  Currently afebrile without leukocytosis despite being on no antibiotics at this time.  1  dose of fosfomycin and monitor clinically.    Acute on chronic diastolic CHF.  EF 65% on echocardiogram in 2019.  Repeat IV Lasix, monitor electrolytes, continue home dose beta-blocker, encouraged to sit up in chair, oxygen nebulizer treatments PRN.    Abdominal pain: Much improved-she had numerous BMs overnight after she was started on Dulcolax x1, senna/MiraLAX.  She has chronic abdominal pain at baseline from chronic pancreatitis.    Lactic acidosis: Secondary to sepsis, has normalized.  Elevated troponin: Trend is flat-not consistent with ACS-likely demand ischemia.  EKG without acute changes.  Given poor overall health-bedbound status-best served by conservative management.  PAF: Sinus rhythm-has prior history of embolic stroke-continue Eliquis.  Chronic thrombocytopenia: Unclear etiology-platelets close to usual baseline.  Follow.  Anemia: Suspect has chronic anemia at baseline-worsened by acute illness, has required 1 unit of PRBC transfusion during this hospital stay.  Hemoglobin low but stable-follow.  History of rheumatoid arthritis with chronic steroid use: Appears to be on chronic prednisone-currently on stress dose IV Solu-Cortef-we will slowly taper over the next few days.  Chronic pancreatitis: Continue Creon.  History of seizures: Continue Depakote.  History of CVA: Reviewed records in care everywhere-she has had prior embolic strokes-difficult neurologic exam due to lethargy/weakness-but appears nonfocal-on Eliquis  Generalized deconditioning/worsening debility: Per patient-she has not ambulated in 4 years-mostly bed to wheelchair bound.  She is going to talk to her family about SNF rehab-as per social work-her family wants to take her home.    Chronic pain syndrome (back pain, neck pain, abdominal pain): Continue prn  oxycodone-await x-ray abdomen-may need to be started on a bowel regimen.  Monitor on low-dose narcotics with caution.  Has tendency to accidentally  overdose.  Hypokalemia and hypomagnesemia.  Replaced.     DVT Prophylaxis: Full dose anticoagulation with Eliquis  Code Status: Full code  Family Communication: None at bedside  Disposition Plan: Remain inpatient-hopefully discharge tomorrow if clinical improvement continues.  Antimicrobial agents: Anti-infectives (From admission, onward)   Start     Dose/Rate Route Frequency Ordered Stop   01/05/19 1000  oseltamivir (TAMIFLU) capsule 30 mg     30 mg Oral 2 times daily 01/05/19 0826 01/10/19 0959   01/04/19 1500  cefTRIAXone (ROCEPHIN) 2 g in sodium chloride 0.9 % 100 mL IVPB  Status:  Discontinued     2 g 200 mL/hr over 30 Minutes Intravenous Every 24 hours 01/04/19 1416 01/07/19 1049   01/04/19 1500  metroNIDAZOLE (FLAGYL) IVPB 500 mg  Status:  Discontinued     500 mg 100 mL/hr over 60 Minutes Intravenous Every 8 hours 01/04/19 1416 01/07/19 1049   01/03/19 1030  sulfamethoxazole-trimethoprim (BACTRIM DS,SEPTRA DS) 800-160 MG per tablet 1 tablet  Status:  Discontinued     1 tablet Oral Every 12 hours 01/03/19 1019 01/04/19 1416   01/03/19 0000  sulfamethoxazole-trimethoprim (BACTRIM DS,SEPTRA DS) 800-160 MG tablet     1 tablet Oral Every 12 hours 01/03/19 1059     12/31/18 1330  vancomycin (VANCOCIN) IVPB 750 mg/150 ml premix  Status:  Discontinued     750 mg 150 mL/hr over 60 Minutes Intravenous Every 48 hours 12/29/18 1738 12/30/18 1127   12/30/18 1200  cefTRIAXone (ROCEPHIN) 2 g in sodium chloride 0.9 % 100 mL IVPB  Status:  Discontinued     2 g 200 mL/hr over 30 Minutes Intravenous Every 24 hours 12/30/18 1127 01/03/19 1019   12/30/18 0130  ceFEPIme (MAXIPIME) 1 g in sodium chloride 0.9 % 100 mL IVPB  Status:  Discontinued     1 g 200 mL/hr over 30 Minutes Intravenous Every 12 hours 12/29/18 1750 12/30/18 1127   12/29/18 1715  ceFEPIme (MAXIPIME) 2 g in sodium chloride 0.9 % 100 mL IVPB  Status:  Discontinued     2 g 200 mL/hr over 30 Minutes Intravenous  Once  12/29/18 1706 12/29/18 1750   12/29/18 1715  metroNIDAZOLE (FLAGYL) IVPB 500 mg  Status:  Discontinued     500 mg 100 mL/hr over 60 Minutes Intravenous Every 8 hours 12/29/18 1706 12/30/18 1127   12/29/18 1715  vancomycin (VANCOCIN) IVPB 1000 mg/200 mL premix  Status:  Discontinued     1,000 mg 200 mL/hr over 60 Minutes Intravenous  Once 12/29/18 1706 12/29/18 1750   12/29/18 1230  ceFEPIme (MAXIPIME) 2 g in sodium chloride 0.9 % 100 mL IVPB     2 g 200 mL/hr over 30 Minutes Intravenous  Once 12/29/18 1226 12/29/18 1424   12/29/18 1230  metroNIDAZOLE (FLAGYL) IVPB 500 mg  Status:  Discontinued     500 mg 100 mL/hr over 60 Minutes Intravenous Every 8 hours 12/29/18 1226 12/29/18 1706   12/29/18 1230  vancomycin (VANCOCIN) IVPB 1000 mg/200 mL premix     1,000 mg 200 mL/hr over 60 Minutes Intravenous  Once 12/29/18 1226 12/29/18 1551      Procedures: None  CONSULTS:  None  Time spent: 25- minutes-Greater than 50% of this time was spent in counseling, explanation of diagnosis, planning of further management, and coordination of care.  MEDICATIONS: Scheduled Meds: .  allopurinol  300 mg Oral Daily  . apixaban  2.5 mg Oral BID  . feeding supplement  1 Container Oral TID BM  . fosfomycin  3 g Oral Once  . furosemide  60 mg Oral Once  . lipase/protease/amylase  36,000 Units Oral TID AC  . mouth rinse  15 mL Mouth Rinse BID  . metolazone  2.5 mg Oral Once  . metoprolol succinate  25 mg Oral Daily  . oseltamivir  30 mg Oral BID  . pantoprazole  40 mg Oral BID  . polyethylene glycol  17 g Oral BID  . predniSONE  5 mg Oral Q breakfast  . senna  2 tablet Oral QHS  . valproic acid  250 mg Oral TID   Continuous Infusions: . magnesium sulfate 1 - 4 g bolus IVPB     PRN Meds:.acetaminophen, diltiazem, hydrOXYzine, ipratropium-albuterol, naLOXone (NARCAN)  injection, oxyCODONE, simethicone   PHYSICAL EXAM: Vital signs: Vitals:   01/07/19 2044 01/08/19 0121 01/08/19 0542 01/08/19  1100  BP:  (!) 155/96  (!) 152/91  Pulse:  99  (!) 102  Resp:  (!) 37  (!) 34  Temp: 97.9 F (36.6 C) 98.2 F (36.8 C) 98.2 F (36.8 C)   TempSrc:  Oral Oral   SpO2:  97%  97%  Weight:      Height:       Filed Weights   12/29/18 1229 12/29/18 2040  Weight: 40.5 kg 50.1 kg   Body mass index is 21.57 kg/m.   Exam  Awake Alert, overall extremely frail and cachectic African-American female who appears much older than her stated age, [place].AT,PERRAL Supple Neck,No JVD, No cervical lymphadenopathy appriciated.  Symmetrical Chest wall movement, Good air movement bilaterally, +ve rales RRR,No Gallops, Rubs or new Murmurs, No Parasternal Heave +ve B.Sounds, Abd Soft, No tenderness, No organomegaly appriciated, No rebound - guarding or rigidity. No Cyanosis, Clubbing or edema, No new Rash or bruise     I have personally reviewed following labs and imaging studies  LABORATORY DATA: CBC: Recent Labs  Lab 01/02/19 0700 01/05/19 0619 01/06/19 0432 01/08/19 0429  WBC 15.4* 6.4 5.0 8.9  HGB 9.1* 7.7* 8.3* 8.5*  HCT 25.7* 23.2* 23.2* 25.0*  MCV 77.6* 79.5* 76.8* 76.9*  PLT 62* 88* 73* 136*    Basic Metabolic Panel: Recent Labs  Lab 01/03/19 0455  01/04/19 0407 01/05/19 0619 01/06/19 0846 01/07/19 0434 01/07/19 1910 01/08/19 0429  NA 143   < > 142 142 137 139  --  138  K 2.7*   < > 4.5 3.6 3.2* 2.9* 3.2* 5.6*  CL 111   < > 112* 113* 107 103  --  103  CO2 21*   < > 22 21* 20* 25  --  26  GLUCOSE 121*   < > 76 100* 424* 506*  --  253*  BUN 6   < > 6 <5* 8 7  --  6  CREATININE 0.87   < > 0.81 0.78 0.86 0.88  --  0.77  CALCIUM 7.3*   < > 7.4* 7.4* 7.4* 7.4*  --  7.6*  MG 1.6*  --  2.1  --  1.5* 1.7  --  1.6*   < > = values in this interval not displayed.    GFR: Estimated Creatinine Clearance: 53.7 mL/min (by C-G formula based on SCr of 0.77 mg/dL).  Liver Function Tests: No results for input(s): AST, ALT, ALKPHOS, BILITOT, PROT, ALBUMIN in the last 168 hours. No  results for input(s): LIPASE, AMYLASE in the last 168 hours. No results for input(s): AMMONIA in the last 168 hours.  Coagulation Profile: No results for input(s): INR, PROTIME in the last 168 hours.  Cardiac Enzymes: No results for input(s): CKTOTAL, CKMB, CKMBINDEX, TROPONINI in the last 168 hours.  BNP (last 3 results) No results for input(s): PROBNP in the last 8760 hours.  HbA1C: Recent Labs    01/07/19 0654  HGBA1C 5.1    CBG: Recent Labs  Lab 01/07/19 1728 01/07/19 2026 01/08/19 0050 01/08/19 0525 01/08/19 0825  GLUCAP 282* 292* 419* 219* 146*    Lipid Profile: No results for input(s): CHOL, HDL, LDLCALC, TRIG, CHOLHDL, LDLDIRECT in the last 72 hours.  Thyroid Function Tests: No results for input(s): TSH, T4TOTAL, FREET4, T3FREE, THYROIDAB in the last 72 hours.  Anemia Panel: No results for input(s): VITAMINB12, FOLATE, FERRITIN, TIBC, IRON, RETICCTPCT in the last 72 hours.  Urine analysis:    Component Value Date/Time   COLORURINE YELLOW 01/04/2019 2048   APPEARANCEUR HAZY (A) 01/04/2019 2048   LABSPEC 1.015 01/04/2019 2048   PHURINE 7.0 01/04/2019 2048   GLUCOSEU NEGATIVE 01/04/2019 2048   HGBUR SMALL (A) 01/04/2019 2048   BILIRUBINUR NEGATIVE 01/04/2019 2048   KETONESUR NEGATIVE 01/04/2019 2048   PROTEINUR 30 (A) 01/04/2019 2048   UROBILINOGEN 1.0 07/27/2015 1052   NITRITE NEGATIVE 01/04/2019 2048   LEUKOCYTESUR NEGATIVE 01/04/2019 2048    Sepsis Labs: Lactic Acid, Venous    Component Value Date/Time   LATICACIDVEN 1.6 01/01/2019 0953    MICROBIOLOGY: Recent Results (from the past 240 hour(s))  Blood Culture (routine x 2)     Status: None   Collection Time: 12/29/18 12:23 PM  Result Value Ref Range Status   Specimen Description BLOOD RIGHT FOREARM  Final   Special Requests   Final    BOTTLES DRAWN AEROBIC AND ANAEROBIC Blood Culture adequate volume Performed at [facility], [place] [address] [place], [place]  [address]    Culture   Final    NO GROWTH 5 DAYS Performed at [facility], [place] [address], [place], [place] [address]    Report Status 01/03/2019 FINAL  Final  Urine culture     Status: Abnormal   Collection Time: 12/29/18 12:24 PM  Result Value Ref Range Status   Specimen Description   Final    URINE, RANDOM Performed at [facility] [address] [place], [place] [address]    Special Requests   Final    NONE Performed at [facility], [place] [address], [place], [place] [address]    Culture MULTIPLE SPECIES PRESENT, SUGGEST RECOLLECTION (A)  Final   Report Status 12/31/2018 FINAL  Final  Blood Culture (routine x 2)     Status: Abnormal   Collection Time: 12/29/18 12:28 PM  Result Value Ref Range Status   Specimen Description BLOOD RIGHT ARM  Final   Special Requests   Final    BOTTLES DRAWN AEROBIC AND ANAEROBIC Blood Culture adequate volume Performed at [facility] [address], [place], [place] [address]    Culture  Setup Time   Final    GRAM NEGATIVE RODS ANAEROBIC BOTTLE ONLY Organism ID to follow CRITICAL RESULT CALLED TO, READ BACK BY AND VERIFIED WITH: Jene Every PharmD 9:45 12/30/18 ([email or web address]) Performed at [facility], [address] [place], [place] [address]    Culture ESCHERICHIA COLI (A)  Final   Report Status 01/01/2019  FINAL  Final   Organism ID, Bacteria ESCHERICHIA COLI  Final      Susceptibility   Escherichia coli - MIC*    AMPICILLIN >=32 RESISTANT Resistant     CEFAZOLIN 32 INTERMEDIATE Intermediate     CEFEPIME <=1 SENSITIVE Sensitive     CEFTAZIDIME <=1 SENSITIVE Sensitive     CEFTRIAXONE <=1 SENSITIVE Sensitive     CIPROFLOXACIN >=4 RESISTANT Resistant     GENTAMICIN <=1 SENSITIVE Sensitive     IMIPENEM <=0.25 SENSITIVE Sensitive     TRIMETH/SULFA <=20 SENSITIVE Sensitive     AMPICILLIN/SULBACTAM >=32 RESISTANT Resistant     PIP/TAZO >=128 RESISTANT Resistant     Extended ESBL  NEGATIVE Sensitive     * ESCHERICHIA COLI  Blood Culture ID Panel (Reflexed)     Status: Abnormal   Collection Time: 12/29/18 12:28 PM  Result Value Ref Range Status   Enterococcus species NOT DETECTED NOT DETECTED Final   Listeria monocytogenes NOT DETECTED NOT DETECTED Final   Staphylococcus species NOT DETECTED NOT DETECTED Final   Staphylococcus aureus (BCID) NOT DETECTED NOT DETECTED Final   Streptococcus species NOT DETECTED NOT DETECTED Final   Streptococcus agalactiae NOT DETECTED NOT DETECTED Final   Streptococcus pneumoniae NOT DETECTED NOT DETECTED Final   Streptococcus pyogenes NOT DETECTED NOT DETECTED Final   Acinetobacter baumannii NOT DETECTED NOT DETECTED Final   Enterobacteriaceae species DETECTED (A) NOT DETECTED Final    Comment: Enterobacteriaceae represent a large family of gram-negative bacteria, not a single organism. CRITICAL RESULT CALLED TO, READ BACK BY AND VERIFIED WITH: Jene Every PharmD 9:45 12/30/18 ([email or web address])    Enterobacter cloacae complex NOT DETECTED NOT DETECTED Final   Escherichia coli DETECTED (A) NOT DETECTED Final    Comment: CRITICAL RESULT CALLED TO, READ BACK BY AND VERIFIED WITH: Jene Every PharmD 9:45 12/30/18 ([email or web address])    Klebsiella oxytoca NOT DETECTED NOT DETECTED Final   Klebsiella pneumoniae NOT DETECTED NOT DETECTED Final   Proteus species NOT DETECTED NOT DETECTED Final   Serratia marcescens NOT DETECTED NOT DETECTED Final   Carbapenem resistance NOT DETECTED NOT DETECTED Final   Haemophilus influenzae NOT DETECTED NOT DETECTED Final   Neisseria meningitidis NOT DETECTED NOT DETECTED Final   Pseudomonas aeruginosa NOT DETECTED NOT DETECTED Final   Candida albicans NOT DETECTED NOT DETECTED Final   Candida glabrata NOT DETECTED NOT DETECTED Final   Candida krusei NOT DETECTED NOT DETECTED Final   Candida parapsilosis NOT DETECTED NOT DETECTED Final   Candida tropicalis NOT DETECTED NOT DETECTED Final    Comment: Performed at [facility], [place] [address], [place], [place] [address]  MRSA PCR Screening     Status: None   Collection Time: 12/29/18  8:36 PM  Result Value Ref Range Status   MRSA by PCR NEGATIVE NEGATIVE Final    Comment:        The GeneXpert MRSA Assay (FDA approved for NASAL specimens only), is one component of a comprehensive MRSA colonization surveillance program. It is not intended to diagnose MRSA infection nor to guide or monitor treatment for MRSA infections. Performed at [facility], [place] [address] [place], [place] [address]   Gastrointestinal Panel by PCR , Stool     Status: None   Collection Time: 12/30/18 10:17 PM  Result Value Ref Range Status   Campylobacter species NOT DETECTED NOT DETECTED Final   Plesimonas shigelloides NOT DETECTED NOT DETECTED Final   Salmonella species NOT DETECTED NOT DETECTED Final  Yersinia enterocolitica NOT DETECTED NOT DETECTED Final   Vibrio species NOT DETECTED NOT DETECTED Final   Vibrio cholerae NOT DETECTED NOT DETECTED Final   Enteroaggregative E coli (EAEC) NOT DETECTED NOT DETECTED Final   Enteropathogenic E coli (EPEC) NOT DETECTED NOT DETECTED Final   Enterotoxigenic E coli (ETEC) NOT DETECTED NOT DETECTED Final   Shiga like toxin producing E coli (STEC) NOT DETECTED NOT DETECTED Final   Shigella/Enteroinvasive E coli (EIEC) NOT DETECTED NOT DETECTED Final   Cryptosporidium NOT DETECTED NOT DETECTED Final   Cyclospora cayetanensis NOT DETECTED NOT DETECTED Final   Entamoeba histolytica NOT DETECTED NOT DETECTED Final   Giardia lamblia NOT DETECTED NOT DETECTED Final   Adenovirus F40/41 NOT DETECTED NOT DETECTED Final   Astrovirus NOT DETECTED NOT DETECTED Final   Norovirus GI/GII NOT DETECTED NOT DETECTED Final   Rotavirus A NOT DETECTED NOT DETECTED Final   Sapovirus (I, II, IV, and V) NOT DETECTED NOT DETECTED Final    Comment: Performed at [facility], [place] [place], [place] [address]  Stool culture  (children & immunocomp patients)     Status: None   Collection Time: 12/30/18 10:17 PM  Result Value Ref Range Status   Salmonella/Shigella Screen Final report  Final   Campylobacter Culture Final report  Final   E coli, Shiga toxin Assay Negative Negative Final    Comment: (NOTE) Performed At: [facility] [place], [place] [id number] Rush Farmer MD [id number]   STOOL CULTURE REFLEX - RSASHR     Status: None   Collection Time: 12/30/18 10:17 PM  Result Value Ref Range Status   Stool Culture result 1 (RSASHR) Comment  Final    Comment: (NOTE) No Salmonella or Shigella recovered. Performed At: [facility] [address] [place], [place] [id number] Rush Farmer MD [id number]   STOOL CULTURE Reflex - CMPCXR     Status: None   Collection Time: 12/30/18 10:17 PM  Result Value Ref Range Status   Stool Culture result 1 (CMPCXR) Comment  Final    Comment: (NOTE) No Campylobacter species isolated. Performed At: [facility] [place], [place] [id number] Rush Farmer MD [id number]   Culture, blood (routine x 2)     Status: None (Preliminary result)   Collection Time: 01/04/19  6:38 PM  Result Value Ref Range Status   Specimen Description BLOOD LEFT HAND  Final   Special Requests   Final    BOTTLES DRAWN AEROBIC ONLY Blood Culture adequate volume   Culture   Final    NO GROWTH 4 DAYS Performed at [facility], [address] [place], [place] [address]    Report Status PENDING  Incomplete  Culture, blood (routine x 2)     Status: None (Preliminary result)   Collection Time: 01/04/19  6:38 PM  Result Value Ref Range Status   Specimen Description BLOOD RIGHT HAND  Final   Special Requests   Final    BOTTLES DRAWN AEROBIC ONLY Blood Culture results may not be optimal due to an excessive volume of blood received in culture bottles   Culture   Final    NO GROWTH 4 DAYS Performed at [facility],  [place] [address], [place], [place] [address]    Report Status PENDING  Incomplete  Culture, Urine     Status: Abnormal   Collection Time: 01/04/19  8:48 PM  Result Value Ref Range Status   Specimen Description URINE, RANDOM  Final   Special Requests   Final    NONE Performed at [facility], [place] [address] [place], [place] [address]    Culture (A)  Final    >=100,000 COLONIES/mL VANCOMYCIN RESISTANT ENTEROCOCCUS ISOLATED   Report Status 01/07/2019 FINAL  Final   Organism ID, Bacteria VANCOMYCIN RESISTANT ENTEROCOCCUS ISOLATED (A)  Final      Susceptibility   Vancomycin resistant enterococcus isolated - MIC*    AMPICILLIN >=32 RESISTANT Resistant     LEVOFLOXACIN >=8 RESISTANT Resistant     NITROFURANTOIN 32 SENSITIVE Sensitive     VANCOMYCIN >=32 RESISTANT Resistant     LINEZOLID 2 SENSITIVE Sensitive     * >=100,000 COLONIES/mL VANCOMYCIN RESISTANT ENTEROCOCCUS ISOLATED    RADIOLOGY STUDIES/RESULTS: Ct Abdomen Pelvis Wo Contrast  Result Date: 12/29/2018 CLINICAL DATA:  Abdomen distension two days ago here for food poisoning Allergic to iv contrast no oral contrast per MD EXAM: CT ABDOMEN AND PELVIS WITHOUT CONTRAST TECHNIQUE: Multidetector CT imaging of the abdomen and pelvis was performed following the standard protocol without IV contrast. COMPARISON:  12/27/2018 FINDINGS: Lower chest: There is bibasilar atelectasis. Hepatobiliary: The liver is homogeneous. Status post cholecystectomy. Pancreas: Pancreas is atrophic. Coarse calcifications throughout the pancreatic body and tail suggests history of chronic pancreatitis. Spleen: Normal in size without focal abnormality. Adrenals/Urinary Tract: Adrenal glands are unremarkable. Kidneys are normal, without renal calculi, focal lesion, or hydronephrosis. Bladder is unremarkable. Stomach/Bowel: Stomach is unremarkable. Small bowel loops are normal in appearance. There are numerous colonic diverticula. No acute diverticulitis. Significant stool  within the rectosigmoid colon. There has been some improvement in the appearance of thickening of the rectosigmoid colon. No perirectal abscess. Vascular/Lymphatic: There is dense atherosclerotic calcification of the abdominal aorta not associated with aneurysm. No retroperitoneal or mesenteric adenopathy. Reproductive: The uterus is present. Multiple mural calcifications are present. No adnexal mass. Other: No ascites. No abdominal wall hernia. Note is made of fat within the LEFT inguinal ring, likely a lipoma. Diffuse body wall edema. Musculoskeletal: Remote wedge compression fractures of T12, L1, and L3. There is 2 millimeters anterolisthesis of L4 on L5. Degenerative changes are identified at L4-5 and L5-S1. No suspicious lytic or blastic lesions are identified. IMPRESSION: 1. Significant stool within the rectosigmoid colon. There has been some improvement in the appearance of thickening of the rectosigmoid colon. 2. Diverticulosis without acute diverticulitis. 3. Status post cholecystectomy. 4. Changes of chronic pancreatitis. 5. Remote wedge compression fractures of T12, L1, and L3. Aortic Atherosclerosis (ICD10-I70.0). Electronically Signed   By: Nolon Nations M.D.   On: 12/29/2018 13:12   Dg Chest Port 1 View  Result Date: 01/08/2019 CLINICAL DATA:  Shortness of breath, asthma, hypertension, history of stroke, GERD, former smoker EXAM: PORTABLE CHEST 1 VIEW COMPARISON:  Portable exam 0650 hours compared to 01/06/2019 FINDINGS: Enlargement of cardiac silhouette. Stable mediastinal contours. Diffuse BILATERAL pulmonary infiltrates question pulmonary edema though infection is not excluded. No gross pleural effusion or pneumothorax. Bones demineralized with prior thoracic spinal fixation. BILATERAL glenohumeral degenerative changes. Old RIGHT rib fractures. Chronic lytic lesion at proximal RIGHT humerus. IMPRESSION: Enlargement of cardiac silhouette with peripheral cyst and diffuse pulmonary infiltrates  favoring pulmonary edema though infection is not excluded. No interval change. Electronically Signed   By: Lavonia Dana M.D.   On: 01/08/2019 09:51   Dg Chest Port 1 View  Result Date: 01/06/2019 CLINICAL DATA:  Cough and congestion. EXAM: PORTABLE CHEST 1 VIEW COMPARISON:  January 05, 2019 FINDINGS: Stable cardiomegaly. Stable small  pleural effusions. Increasing pulmonary opacities suggestive of worsening edema. Persistent lytic lesion in the proximal right humerus. IMPRESSION: 1. Cardiomegaly and worsening pulmonary edema.  Small effusions. 2. Persistent lytic lesion in the proximal right humerus. Gout was suggested on previous right shoulder x-rays. Electronically Signed   By: Dorise Bullion III M.D   On: 01/06/2019 09:09   Dg Chest Port 1 View  Result Date: 01/05/2019 CLINICAL DATA:  Five day history of shortness of breath. Acute mid chest pain that began today. EXAM: PORTABLE CHEST 1 VIEW COMPARISON:  01/04/2019 and earlier. FINDINGS: Cardiac silhouette moderately enlarged, unchanged. Increasing interstitial opacities superimposed upon the previously identified interstitial lung disease. Stable small BILATERAL pleural effusions. Prior thoracic spine corpectomy and fusion. Lytic lesion in the proximal RIGHT humerus. Severe degenerative changes involving both shoulders. IMPRESSION: 1. Developing interstitial pulmonary edema is suspected, superimposed upon the previously identified interstitial lung disease. 2. Stable small BILATERAL pleural effusions. 3. Lytic lesion involving the proximal RIGHT humerus. Does the patient have a known malignancy or multiple myeloma? Electronically Signed   By: Evangeline Dakin M.D.   On: 01/05/2019 08:58   Dg Chest Port 1 View  Result Date: 01/04/2019 CLINICAL DATA:  Difficulty breathing EXAM: PORTABLE CHEST 1 VIEW COMPARISON:  December 29, 2018; November 13, 2018 FINDINGS: Diffuse interstitial thickening remains stable. There is no frank edema consolidation. Heart is  upper normal in size with pulmonary vascularity normal. No adenopathy. Extensive postoperative change in the thoracic spine noted. Evidence of old right-sided rib trauma laterally. There is arthropathy in the shoulders, stable. Cystic area in the proximal right humerus is stable. IMPRESSION: Diffuse interstitial thickening, likely reflecting chronic fibrosis. No frank edema or consolidation evident. Stable cardiac silhouette. Extensive postoperative change in the thoracic spine. Advanced arthropathy in the shoulders. Prior rib trauma on the right. Electronically Signed   By: Lowella Grip III M.D.   On: 01/04/2019 08:51   Dg Chest Port 1 View  Result Date: 12/29/2018 CLINICAL DATA:  63 year old female with altered mental status, fever, decreased p.o. intake. EXAM: PORTABLE CHEST 1 VIEW COMPARISON:  Portable chest 11/13/2018 and earlier. FINDINGS: Portable AP semi upright view at 1320 hours. Sequelae of previous thoracic spine corpectomy and posterior fusion. Hardware appears stable. Stable visualized osseous structures. Stable lung volumes and mediastinal contours. Visualized tracheal air column is within normal limits. Chronic increased pulmonary interstitial markings appear stable since 2019. No pneumothorax, pleural effusion or acute pulmonary opacity. Mildly increased bowel gas in the abdomen. Stable cholecystectomy clips. IMPRESSION: Stable.  No acute cardiopulmonary abnormality. Electronically Signed   By: Genevie Ann M.D.   On: 12/29/2018 14:03   Dg Abd Portable 1v  Result Date: 12/30/2018 CLINICAL DATA:  Abdominal pain EXAM: PORTABLE ABDOMEN - 1 VIEW COMPARISON:  CT 12/29/2018 FINDINGS: Surgical hardware in the lower thoracic spine. Dense aortic atherosclerosis. Mild diffuse increased bowel gas without obstructive pattern. Possible bowel wall thickening at the hepatic flexure and descending colon. No radiopaque calculi. IMPRESSION: Diffuse increased bowel gas without obstructive pattern. There may be  bowel wall edema at the hepatic flexure and descending colon as may be seen with colitis Electronically Signed   By: Donavan Foil M.D.   On: 12/30/2018 21:45   Dg Abd Portable 2v  Result Date: 01/01/2019 CLINICAL DATA:  Vomiting. EXAM: PORTABLE ABDOMEN - 2 VIEW COMPARISON:  Radiograph of December 30, 2018. FINDINGS: No small bowel dilatation is noted. Mildly dilated colon loop is seen in right side of abdomen which most likely represents ileus.  Status post cholecystectomy. There is no evidence of free air. No radio-opaque calculi or other significant radiographic abnormality is seen. IMPRESSION: Mildly dilated colonic loops seen in right side of abdomen which may represent ileus. Electronically Signed   By: Marijo Conception, M.D.   On: 01/01/2019 14:50     LOS: 10 days   Signature  Lala Lund M.D on 01/08/2019 at 12:06 PM   -  To page go to [email or web address]

## 2019-01-08 NOTE — Progress Notes (Signed)
Pt refused all Q2 turns throughout the night.  Pt educated on the importance of changing positions at least every 2 hours.  Pt refused after education given. Will continue to monitor.

## 2019-01-09 DIAGNOSIS — J9601 Acute respiratory failure with hypoxia: Secondary | ICD-10-CM

## 2019-01-09 LAB — BASIC METABOLIC PANEL
ANION GAP: 9 (ref 5–15)
BUN: 7 mg/dL (ref 6–20)
CO2: 27 mmol/L (ref 22–32)
Calcium: 7.9 mg/dL — ABNORMAL LOW (ref 8.9–10.3)
Chloride: 98 mmol/L (ref 98–111)
Creatinine, Ser: 0.91 mg/dL (ref 0.44–1.00)
GFR calc Af Amer: >60 mL/min (ref >60)
GFR calc non Af Amer: >60 mL/min (ref >60)
Glucose, Bld: 68 mg/dL — ABNORMAL LOW (ref 70–99)
Potassium: 4.5 mmol/L (ref 3.5–5.1)
Sodium: 134 mmol/L — ABNORMAL LOW (ref 135–145)

## 2019-01-09 LAB — GLUCOSE, CAPILLARY
GLUCOSE-CAPILLARY: 567 mg/dL — AB (ref 70–99)
GLUCOSE-CAPILLARY: 68 mg/dL — AB (ref 70–99)
GLUCOSE-CAPILLARY: 91 mg/dL (ref 70–99)
Glucose-Capillary: 60 mg/dL — ABNORMAL LOW (ref 70–99)
Glucose-Capillary: 86 mg/dL (ref 70–99)
Glucose-Capillary: 111 mg/dL — ABNORMAL HIGH (ref 70–99)
Glucose-Capillary: 189 mg/dL — ABNORMAL HIGH (ref 70–99)
Glucose-Capillary: 346 mg/dL — ABNORMAL HIGH (ref 70–99)
Glucose-Capillary: 526 mg/dL (ref 70–99)

## 2019-01-09 LAB — CULTURE, BLOOD (ROUTINE X 2)
Culture: NO GROWTH
Culture: NO GROWTH
Special Requests: ADEQUATE

## 2019-01-09 LAB — MAGNESIUM: Magnesium: 1.7 mg/dL (ref 1.7–2.4)

## 2019-01-09 MED ORDER — METHYLPREDNISOLONE SODIUM SUCC 125 MG IJ SOLR
60.0000 mg | Freq: Two times a day (BID) | INTRAMUSCULAR | Status: DC
  Administered 2019-01-09 (×2): 60 mg via INTRAVENOUS
  Filled 2019-01-09 (×2): qty 2

## 2019-01-09 MED ORDER — METOLAZONE 2.5 MG PO TABS
2.5000 mg | ORAL_TABLET | Freq: Once | ORAL | Status: AC
  Administered 2019-01-09: 2.5 mg via ORAL
  Filled 2019-01-09: qty 1

## 2019-01-09 MED ORDER — SPIRONOLACTONE 25 MG PO TABS
25.0000 mg | ORAL_TABLET | Freq: Every day | ORAL | Status: DC
  Administered 2019-01-09 – 2019-01-10 (×2): 25 mg via ORAL
  Filled 2019-01-09 (×2): qty 1

## 2019-01-09 MED ORDER — PREDNISONE 5 MG PO TABS: 5.0000 mg | ORAL_TABLET | Freq: Every day | ORAL | Status: DC

## 2019-01-09 MED ORDER — FUROSEMIDE 10 MG/ML IJ SOLN
60.0000 mg | Freq: Three times a day (TID) | INTRAMUSCULAR | Status: DC
  Administered 2019-01-09 – 2019-01-10 (×4): 60 mg via INTRAVENOUS
  Filled 2019-01-09 (×4): qty 6

## 2019-01-09 MED ORDER — INSULIN ASPART 100 UNIT/ML ~~LOC~~ SOLN
15.0000 [IU] | Freq: Once | SUBCUTANEOUS | Status: AC
  Administered 2019-01-09: 15 [IU] via SUBCUTANEOUS

## 2019-01-09 MED ORDER — INSULIN ASPART 100 UNIT/ML ~~LOC~~ SOLN
0.0000 [IU] | Freq: Three times a day (TID) | SUBCUTANEOUS | Status: DC
  Administered 2019-01-10: 5 [IU] via SUBCUTANEOUS

## 2019-01-09 NOTE — Progress Notes (Signed)
CBG this evening 346. Patient had just taken a boost breeze drink by mouth. MD Thedore Mins made aware. Will continue to monitor.

## 2019-01-09 NOTE — Progress Notes (Addendum)
NAMEKazi [name], MRN:  [id number], DOB:  1956/08/10, LOS: 11 ADMISSION DATE:  12/29/2018, CONSULTATION DATE: 01/09/2019 REFERRING MD: [facility] hospitalist, CHIEF COMPLAINT: Poor respiratory effort and hypoxia.  HPI/course in hospital  63 year old woman with a complicated history well outlined in Dr. Doristine Church note.  Essentially she has been bedbound for approximately 4 years.  She has a history of prior CVA and back surgery.  She is exceedingly frail.  She was admitted with gram-negative bacteremia likely from a urinary source for which she has completed a course of antibiotics.  She has developed influenza while in hospital and has been short of breath. Her BNP is also elevated and she is being diuresed on a background history of heart failure.  She is on chronic narcotics for back pain which have left her somewhat obtunded with a poor cough.  The family is frustrated that she has not made any improvements.  Past Medical History   Past Medical History:  Diagnosis Date  . ACS (acute coronary syndrome) (HCC) 06/12/2015  . Acute on chronic blood loss anemia 11/25/2017  . Arthritis   . Asthma   . 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 Chronic pain   . Constipation   . Diabetes mellitus without complication (HCC)   . Duodenitis   . Embolic stroke (HCC)   . Falls frequently   . Focal seizure (HCC)   . GERD (gastroesophageal reflux disease)   . Hypertension   . Impacted fracture 02/15/2017   right glenoid  . Pneumonia 10/2015  . Seizures (HCC)    last seizure March 2015  . Small bowel obstruction (HCC) 11/25/2017     Past Surgical History:  Procedure Laterality Date  . APPENDECTOMY    . BACK SURGERY    . CHOLECYSTECTOMY    . TEE WITHOUT CARDIOVERSION N/A 10/30/2016   Procedure: TRANSESOPHAGEAL ECHOCARDIOGRAM (TEE);  Surgeon: Lars Masson, MD;  Location: [facility] ENDOSCOPY;  Service: Cardiovascular;  Laterality: N/A;     Review of Systems:   Review  of Systems  All other systems reviewed and are negative.   Social History   reports that she quit smoking about 10 years ago. She has never used smokeless tobacco. She reports that she does not drink alcohol or use drugs.   Family History   Her family history includes Hypertension in her brother and sister; Kidney failure in her mother.   Allergies Allergies  Allergen Reactions  . Ivp Dye [Iodinated Diagnostic Agents] Itching  . Metrizamide Itching     Home Medications  Prior to Admission medications   Medication Sig Start Date End Date Taking? Authorizing Provider  allopurinol (ZYLOPRIM) 300 MG tablet Take 300 mg by mouth daily. 11/11/16  Yes [provider]  apixaban (ELIQUIS) 2.5 MG TABS tablet Take 2.5 mg by mouth 2 (two) times daily.   Yes [provider]  atorvastatin (LIPITOR) 10 MG tablet Take 10 mg by mouth daily at 6 PM.  08/13/15  Yes [provider]  colestipol (COLESTID) 1 g tablet Take 1 g by mouth 2 (two) times daily.   Yes [provider]  esomeprazole (NEXIUM) 40 MG capsule Take 40 mg by mouth daily. 12/17/18  Yes [provider]  feeding supplement, ENSURE ENLIVE, (ENSURE ENLIVE) LIQD Take 237 mLs by mouth 2 (two) times daily between meals. 11/10/15  Yes Tat, Onalee Hua, MD  furosemide (LASIX) 20 MG tablet Take 1 tablet (20 mg total) by mouth daily. 10/15/18  Yes Mikhail,  Maryann, DO  metoprolol succinate (TOPROL-XL) 25 MG 24 hr tablet Take 1 tablet (25 mg total) by mouth daily. 10/15/18  Yes Mikhail, Pinas, DO  Multiple Vitamin (MULTIVITAMIN WITH MINERALS) TABS tablet Take 1 tablet by mouth daily.   Yes [provider]  Pancrelipase, Lip-Prot-Amyl, 24000-76000 units CPEP Take 3 capsules by mouth 3 (three) times daily. 11/06/18 02/04/19 Yes [provider]  pantoprazole (PROTONIX) 40 MG tablet Take 40 mg by mouth 2 (two) times daily. 08/21/18  Yes [provider]  valproic acid (DEPAKENE) 250 MG capsule  Take 500 mg by mouth 3 (three) times daily.   Yes [provider]  Oxycodone HCl 20 MG TABS Take 0.5 tablets (10 mg total) by mouth 4 (four) times daily as needed for up to 5 days. 01/04/19 01/09/19  Leroy Sea, MD  polyethylene glycol (MIRALAX / Ethelene Hal) packet Take 17 g by mouth daily. 01/03/19   Ghimire, Werner Lean, MD  potassium chloride SA (K-DUR,KLOR-CON) 20 MEQ tablet Take 1 tablet (20 mEq total) by mouth daily. 01/04/19   Ghimire, Werner Lean, MD  predniSONE (DELTASONE) 5 MG tablet Take 1 tablet (5 mg total) by mouth daily with breakfast. Restart after prednisone taper. 01/03/19   Ghimire, Werner Lean, MD  senna (SENOKOT) 8.6 MG TABS tablet Take 2 tablets (17.2 mg total) by mouth at bedtime. 01/03/19   Ghimire, Werner Lean, MD  sulfamethoxazole-trimethoprim (BACTRIM DS,SEPTRA DS) 800-160 MG tablet Take 1 tablet by mouth every 12 (twelve) hours. 01/03/19   Ghimire, Werner Lean, MD     Interim history/subjective:  Her narcotics have been stopped by Dr. Thedore Mins and her mentation has improved.  She has no voiced complaints apart from upper back pain. Her son and daughter-in-law are at bedside and noticed that she is more interactive and that she has been swallowing her food without cough or gag.  Objective   Blood pressure (!) 150/105, pulse 100, temperature 98.4 F (36.9 C), temperature source Axillary, resp. rate (!) 30, height 5' (1.524 m), weight 50.1 kg, SpO2 99 %.        Intake/Output Summary (Last 24 hours) at 01/09/2019 1145 Last data filed at 01/08/2019 2300 Gross per 24 hour  Intake 120 ml  Output 2400 ml  Net -2280 ml   Filed Weights   12/29/18 1229 12/29/18 2040  Weight: 40.5 kg 50.1 kg    Examination: Physical Exam  Constitutional:  She is cachectic and frail  HENT:  Head: Normocephalic and atraumatic.  Eyes: Scleral icterus is present.  Neck: Neck supple.  Cardiovascular: Normal rate and regular rhythm.  Respiratory: Effort normal. She has no wheezes. She has  rales.  GI: Soft.  Neurological:  Generalized weakness     Ancillary tests (personally reviewed)  CBC: Recent Labs  Lab 01/05/19 0619 01/06/19 0432 01/08/19 0429  WBC 6.4 5.0 8.9  HGB 7.7* 8.3* 8.5*  HCT 23.2* 23.2* 25.0*  MCV 79.5* 76.8* 76.9*  PLT 88* 73* 136*    Basic Metabolic Panel: Recent Labs  Lab 01/04/19 0407 01/05/19 0619 01/06/19 0846 01/07/19 0434 01/07/19 1910 01/08/19 0429 01/08/19 1710 01/09/19 0432  NA 142 142 137 139  --  138  --  134*  K 4.5 3.6 3.2* 2.9* 3.2* 5.6* 4.9 4.5  CL 112* 113* 107 103  --  103  --  98  CO2 22 21* 20* 25  --  26  --  27  GLUCOSE 76 100* 424* 506*  --  253*  --  68*  BUN 6 <5* 8 7  --  6  --  7  CREATININE 0.81 0.78 0.86 0.88  --  0.77  --  0.91  CALCIUM 7.4* 7.4* 7.4* 7.4*  --  7.6*  --  7.9*  MG 2.1  --  1.5* 1.7  --  1.6*  --  1.7   GFR: Estimated Creatinine Clearance: 47.2 mL/min (by C-G formula based on SCr of 0.91 mg/dL). Recent Labs  Lab 01/05/19 0619 01/06/19 0432 01/08/19 0429  WBC 6.4 5.0 8.9    Liver Function Tests: No results for input(s): AST, ALT, ALKPHOS, BILITOT, PROT, ALBUMIN in the last 168 hours. No results for input(s): LIPASE, AMYLASE in the last 168 hours. No results for input(s): AMMONIA in the last 168 hours.  ABG    Component Value Date/Time   PHART 7.290 (L) 09/24/2018 1959   PCO2ART 42.0 09/24/2018 1959   PO2ART 87.0 09/24/2018 1959   HCO3 13.6 (L) 12/29/2018 1308   TCO2 23 10/10/2018 1413   ACIDBASEDEF 12.2 (H) 12/29/2018 1308   O2SAT 83.9 12/29/2018 1308     Coagulation Profile: No results for input(s): INR, PROTIME in the last 168 hours.  Cardiac Enzymes: No results for input(s): CKTOTAL, CKMB, CKMBINDEX, TROPONINI in the last 168 hours.  HbA1C: Hgb A1c MFr Bld  Date/Time Value Ref Range Status  01/07/2019 06:54 AM 5.1 4.8 - 5.6 % Final    Comment:    (NOTE)         Prediabetes: 5.7 - 6.4         Diabetes: >6.4         Glycemic control for adults with diabetes:  <7.0   05/08/2018 11:14 AM 5.7 (H) 4.8 - 5.6 % Final    Comment:    (NOTE) Pre diabetes:          5.7%-6.4% Diabetes:              >6.4% Glycemic control for   <7.0% adults with diabetes     CBG: Recent Labs  Lab 01/09/19 0058 01/09/19 0509 01/09/19 0531 01/09/19 0548 01/09/19 0839  GLUCAP 91 68* 60* 86 111*     Assessment & Plan:   Multifactorial dyspnea secondary to a combination of influenza pneumonia, decompensated heart failure, chest wall restriction secondary to her prior upper back surgery and reduced cough and respiratory drive due to oversedation from narcotics.  She is significantly better now that her narcotics have been held and she is interactive and participating although she remains frail.  I will continue the current course of treatment and I have emphasized the importance of deep breathing and mobilization to her and her son.  They are in agreement with the current plan.  Please reconsult if further questions arise with regarding her care.   Lynnell Catalanavi Ector Laurel, MD [facility] ICU Phys[facility]  Pager: [phone] Mobile: [phone] After hours: [phone].  01/09/2019, 11:45 AM

## 2019-01-09 NOTE — Significant Event (Signed)
Hypoglycemic Event  CBG: 68  Treatment: 8 oz juice/soda  Symptoms: None  Follow-up CBG: Time:0531 CBG Result:60  Possible Reasons for Event: Inadequate meal intake  Comments/MD notified:Schorr, MD    Adair Laundry

## 2019-01-09 NOTE — Plan of Care (Signed)
Patient requires more education on medication administration times especially for pain medica Problem: Elimination: Goal: Will not experience complications related to bowel motility 01/09/2019 0241 by Adair Laundry, RN Outcome: Progressing 01/09/2019 0239 by Adair Laundry, RN Outcome: Progressing 01/09/2019 0238 by Adair Laundry, RN Outcome: Progressing Goal: Will not experience complications related to urinary retention 01/09/2019 0241 by Adair Laundry, RN Outcome: Progressing 01/09/2019 0239 by Adair Laundry, RN Outcome: Progressing   Problem: Safety: Goal: Ability to remain free from injury will improve 01/09/2019 0241 by Adair Laundry, RN Outcome: Progressing 01/09/2019 0239 by Adair Laundry, RN Outcome: Progressing 01/09/2019 0238 by Adair Laundry, RN Outcome: Progressing   Problem: Skin Integrity: Goal: Risk for impaired skin integrity will decrease 01/09/2019 0241 by Adair Laundry, RN Outcome: Progressing 01/09/2019 0239 by Adair Laundry, RN Outcome: Progressing 01/09/2019 0238 by Adair Laundry, RN Outcome: Progressing   Problem: Education: Goal: Knowledge of General Education information will improve Description Including pain rating scale, medication(s)/side effects and non-pharmacologic comfort measures 01/09/2019 0241 by Adair Laundry, RN Outcome: Not Progressing 01/09/2019 0239 by Adair Laundry, RN Outcome: Not Progressing 01/09/2019 0238 by Adair Laundry, RN Outcome: Not Progressing   Problem: Health Behavior/Discharge Planning: Goal: Ability to manage health-related needs will improve 01/09/2019 0241 by Adair Laundry, RN Outcome: Not Progressing 01/09/2019 0239 by Adair Laundry, RN Outcome: Not Progressing 01/09/2019 0238 by Adair Laundry, RN Outcome: Not Progressing   Problem: Clinical Measurements: Goal: Ability to maintain clinical measurements within normal limits will improve 01/09/2019 0241 by Adair Laundry, RN Outcome: Not Progressing 01/09/2019 0239 by Adair Laundry, RN Outcome: Not Progressing Goal: Will remain free from infection 01/09/2019 0241 by Adair Laundry, RN Outcome: Not Progressing 01/09/2019 0239 by Adair Laundry, RN Outcome: Not Progressing Goal: Diagnostic test results will improve 01/09/2019 0241 by Adair Laundry, RN Outcome: Not Progressing 01/09/2019 0239 by Adair Laundry, RN Outcome: Not Progressing Goal: Respiratory complications will improve 01/09/2019 0241 by Adair Laundry, RN Outcome: Not Progressing 01/09/2019 0239 by Adair Laundry, RN Outcome: Not Progressing Goal: Cardiovascular complication will be avoided 01/09/2019 0241 by Adair Laundry, RN Outcome: Not Progressing 01/09/2019 0239 by Adair Laundry, RN Outcome: Not Progressing   Problem: Activity: Goal: Risk for activity intolerance will decrease 01/09/2019 0241 by Adair Laundry, RN Outcome: Not Progressing 01/09/2019 0239 by Adair Laundry, RN Outcome: Not Progressing 01/09/2019 0238 by Adair Laundry, RN Outcome: Not Progressing   Problem: Nutrition: Goal: Adequate nutrition will be maintained 01/09/2019 0241 by Adair Laundry, RN Outcome: Not Progressing 01/09/2019 0239 by Adair Laundry, RN Outcome: Not Progressing 01/09/2019 0238 by Adair Laundry, RN Outcome: Not Progressing   Problem: Coping: Goal: Level of anxiety will decrease 01/09/2019 0241 by Adair Laundry, RN Outcome: Not Progressing 01/09/2019 0239 by Adair Laundry, RN Outcome: Not Progressing 01/09/2019 0238 by Adair Laundry, RN Outcome: Not Progressing   Problem: Pain Managment: Goal: General experience of comfort will improve 01/09/2019 0241 by Adair Laundry, RN Outcome: Not Progressing 01/09/2019 0239 by Adair Laundry, RN Outcome: Not Progressing 01/09/2019 0238 by Adair Laundry, RN Outcome: Not Progressing  tions.

## 2019-01-09 NOTE — Significant Event (Signed)
Hypoglycemic Event  CBG: 60  Treatment: 8 oz juice/soda  Symptoms: None  Follow-up CBG: [id number] CBG Result:86  Possible Reasons for Event: Inadequate meal intake  Comments/MD notified:Schorr    Pecola Leisure Linwood Gullikson

## 2019-01-09 NOTE — Plan of Care (Deleted)
Patient requires more education on medication administration times especially for pain medications.

## 2019-01-09 NOTE — Progress Notes (Addendum)
PROGRESS NOTE        PATIENT DETAILS Name: Amber [name] Age: 63 y.o. Sex: female Date of Birth: 1956/08/01 Admit Date: 12/29/2018 Admitting Physician Rush Farmer, MD PCP:Sun, Mikeal Hawthorne, MD  Brief Narrative:  Patient is a 63 y.o. female with history of chronic diastolic heart failure, PAF on anticoagulation, embolic CVA,  seizure disorder, rheumatoid arthritis, prior back back surgeries, chronic pain syndrome-presented to the ED for hypotension, was found to have septic shock secondary to gram-negative bacteremia.  See below for further details  Subjective: Patient in bed, appears comfortable, denies any headache, no fever, no chest pain or pressure, +ve shortness of breath , no abdominal pain. No focal weakness.  Assessment/Plan:  Septic shock secondary to E. coli bacteremia: Sepsis pathophysiology has resolved, was initially on Rocephin now Bactrim.  Continue total 10-day course.  Stop date will be 01/09/2019.  Cough-shortness of breath and orthopnea.  Combination of hospital-acquired influenza infection + dCHF.  Placed on Tamiflu, proBNP elevated, stopped IV fluids, is being diuresed with high-dose IV Lasix along with Zaroxolyn , symptoms only minimally improved, encouraged to sit up in chair, have added  Flutter valve added for pulmonary toiletry along with nebulizer treatments and oxygen for supportive care.  Despite multiple sessions of counseling patient is noncompliant about using flutter valve and sitting up in chair, re-counseled again on 01/09/2019.  Will withhold further narcotics, give a trial of IV steroids as well as ongoing diuresis, Tamiflu, oxygen and nebulizer treatment.  Problem is her extreme frail status, being bedbound virtually 24/7, poor cough flex along with chronic use of scheduled high-dose narcotics and poor ability to clear her pulmonary secretions.  Have been encouraging her daily to sit up in the chair and use flutter valve which so far she has  not been doing see note above.  Her long-term prognosis appears very poor due to her underlying extremely frail status however patient and family continue to have unreasonable expectations.  Also seen by palliative care will involve pulmonary critical care for another opinion.  Want to be full code.  Called family updated they still continue to have unreasonable expectations.   ESBL enterococcus UTI.  More likely colonization.  Currently afebrile without leukocytosis despite being on no antibiotics at this time.  She has been given 1 dose of oral fosfomycin on 01/08/2019, will continue to monitor clinically.    Acute on chronic diastolic CHF.  EF 65% on echocardiogram in 2019.  Repeat IV Lasix at higher dose with repeat dose of Zaroxolyn on 01/09/2019, monitor electrolytes, continue home dose beta-blocker, encouraged to sit up in chair, oxygen nebulizer treatments PRN.    Abdominal pain: Much improved-she had numerous BMs overnight after she was started on Dulcolax x1, senna/MiraLAX.  She has chronic abdominal pain at baseline from chronic pancreatitis.    Lactic acidosis: Secondary to sepsis, has normalized.  Elevated troponin: Trend is flat-not consistent with ACS-likely demand ischemia.  EKG without acute changes.  Given poor overall health-bedbound status-best served by conservative management.  PAF: Sinus rhythm-has prior history of embolic stroke-continue Eliquis.  Chronic thrombocytopenia: Unclear etiology-platelets close to usual baseline.  Follow.  Anemia: Suspect has chronic anemia at baseline-worsened by acute illness, has required 1 unit of PRBC transfusion during this hospital stay.  Hemoglobin low but stable-follow.  History of rheumatoid arthritis with chronic steroid use: Appears to be on  chronic prednisone-currently on stress dose IV Solu-Cortef-we will slowly taper over the next few days.  Chronic pancreatitis: Continue Creon.  History of seizures: Continue Depakote.  History  of CVA: Reviewed records in care everywhere-she has had prior embolic strokes-difficult neurologic exam due to lethargy/weakness-but appears nonfocal-on Eliquis  Generalized deconditioning/worsening debility: Per patient-she has not ambulated in 4 years-mostly bed to wheelchair bound.  She is going to talk to her family about SNF rehab-as per social work-her family wants to take her home.    Chronic pain syndrome (back pain, neck pain, abdominal pain): Had stool burden for which she has been placed on regimen, narcotics were cut in half initially and was stopped them on 01/09/2019 as she continues to have extremely poor cough reflex and appears somnolent most of the time.  Patient was given an option of stopping narcotics or declining further she agreed to stopping narcotics.  RN in room on 01/09/2019.  Hypokalemia and hypomagnesemia.  Replaced.     DVT Prophylaxis: Full dose anticoagulation with Eliquis  Code Status: Full code  Family Communication: None at bedside  Disposition Plan: Remain inpatient-hopefully discharge tomorrow if clinical improvement continues.  Antimicrobial agents: Anti-infectives (From admission, onward)   Start     Dose/Rate Route Frequency Ordered Stop   01/08/19 1345  sulfamethoxazole-trimethoprim (BACTRIM DS,SEPTRA DS) 800-160 MG per tablet 1 tablet     1 tablet Oral Every 12 hours 01/08/19 1334 01/10/19 0959   01/05/19 1000  oseltamivir (TAMIFLU) capsule 30 mg     30 mg Oral 2 times daily 01/05/19 0826 01/10/19 0959   01/04/19 1500  cefTRIAXone (ROCEPHIN) 2 g in sodium chloride 0.9 % 100 mL IVPB  Status:  Discontinued     2 g 200 mL/hr over 30 Minutes Intravenous Every 24 hours 01/04/19 1416 01/07/19 1049   01/04/19 1500  metroNIDAZOLE (FLAGYL) IVPB 500 mg  Status:  Discontinued     500 mg 100 mL/hr over 60 Minutes Intravenous Every 8 hours 01/04/19 1416 01/07/19 1049   01/03/19 1030  sulfamethoxazole-trimethoprim (BACTRIM DS,SEPTRA DS) 800-160 MG per  tablet 1 tablet  Status:  Discontinued     1 tablet Oral Every 12 hours 01/03/19 1019 01/04/19 1416   01/03/19 0000  sulfamethoxazole-trimethoprim (BACTRIM DS,SEPTRA DS) 800-160 MG tablet     1 tablet Oral Every 12 hours 01/03/19 1059     12/31/18 1330  vancomycin (VANCOCIN) IVPB 750 mg/150 ml premix  Status:  Discontinued     750 mg 150 mL/hr over 60 Minutes Intravenous Every 48 hours 12/29/18 1738 12/30/18 1127   12/30/18 1200  cefTRIAXone (ROCEPHIN) 2 g in sodium chloride 0.9 % 100 mL IVPB  Status:  Discontinued     2 g 200 mL/hr over 30 Minutes Intravenous Every 24 hours 12/30/18 1127 01/03/19 1019   12/30/18 0130  ceFEPIme (MAXIPIME) 1 g in sodium chloride 0.9 % 100 mL IVPB  Status:  Discontinued     1 g 200 mL/hr over 30 Minutes Intravenous Every 12 hours 12/29/18 1750 12/30/18 1127   12/29/18 1715  ceFEPIme (MAXIPIME) 2 g in sodium chloride 0.9 % 100 mL IVPB  Status:  Discontinued     2 g 200 mL/hr over 30 Minutes Intravenous  Once 12/29/18 1706 12/29/18 1750   12/29/18 1715  metroNIDAZOLE (FLAGYL) IVPB 500 mg  Status:  Discontinued     500 mg 100 mL/hr over 60 Minutes Intravenous Every 8 hours 12/29/18 1706 12/30/18 1127   12/29/18 1715  vancomycin (VANCOCIN) IVPB 1000  mg/200 mL premix  Status:  Discontinued     1,000 mg 200 mL/hr over 60 Minutes Intravenous  Once 12/29/18 1706 12/29/18 1750   12/29/18 1230  ceFEPIme (MAXIPIME) 2 g in sodium chloride 0.9 % 100 mL IVPB     2 g 200 mL/hr over 30 Minutes Intravenous  Once 12/29/18 1226 12/29/18 1424   12/29/18 1230  metroNIDAZOLE (FLAGYL) IVPB 500 mg  Status:  Discontinued     500 mg 100 mL/hr over 60 Minutes Intravenous Every 8 hours 12/29/18 1226 12/29/18 1706   12/29/18 1230  vancomycin (VANCOCIN) IVPB 1000 mg/200 mL premix     1,000 mg 200 mL/hr over 60 Minutes Intravenous  Once 12/29/18 1226 12/29/18 1551      Procedures: None  CONSULTS: Palliative care, pulmonary  Time spent: 25- minutes-Greater than 50% of this  time was spent in counseling, explanation of diagnosis, planning of further management, and coordination of care.  MEDICATIONS: Scheduled Meds: . allopurinol  300 mg Oral Daily  . apixaban  2.5 mg Oral BID  . feeding supplement  1 Container Oral TID BM  . furosemide  60 mg Intravenous TID  . lipase/protease/amylase  36,000 Units Oral TID AC  . mouth rinse  15 mL Mouth Rinse BID  . methylPREDNISolone (SOLU-MEDROL) injection  60 mg Intravenous Q12H  . metoprolol succinate  25 mg Oral Daily  . oseltamivir  30 mg Oral BID  . pantoprazole  40 mg Oral BID  . polyethylene glycol  17 g Oral BID  . [START ON 01/12/2019] predniSONE  5 mg Oral Q breakfast  . senna  2 tablet Oral QHS  . spironolactone  25 mg Oral Daily  . sulfamethoxazole-trimethoprim  1 tablet Oral Q12H  . valproic acid  250 mg Oral TID   Continuous Infusions:  PRN Meds:.acetaminophen, diltiazem, hydrOXYzine, ipratropium-albuterol, naLOXone (NARCAN)  injection, oxyCODONE, simethicone   PHYSICAL EXAM: Vital signs: Vitals:   01/09/19 0530 01/09/19 0534 01/09/19 0801 01/09/19 0822  BP: (!) 134/99   (!) 150/105  Pulse: (!) 104   (!) 104  Resp: (!) 32 (!) 29 (!) 34 (!) 48  Temp:    98.4 F (36.9 C)  TempSrc:    Axillary  SpO2: 95%  96% 99%  Weight:      Height:       Filed Weights   12/29/18 1229 12/29/18 2040  Weight: 40.5 kg 50.1 kg   Body mass index is 21.57 kg/m.   Exam  Awake Alert, overall extremely frail and cachectic African-American female who appears much older than her stated age, [place].AT,PERRAL Supple Neck,No JVD, No cervical lymphadenopathy appriciated.  Symmetrical Chest wall movement, Good air movement bilaterally, +ve rales RRR,No Gallops, Rubs or new Murmurs, No Parasternal Heave +ve B.Sounds, Abd Soft, No tenderness, No organomegaly appriciated, No rebound - guarding or rigidity. No Cyanosis, Clubbing or edema, No new Rash or bruise  I have personally reviewed following labs and imaging  studies  LABORATORY DATA: CBC: Recent Labs  Lab 01/05/19 0619 01/06/19 0432 01/08/19 0429  WBC 6.4 5.0 8.9  HGB 7.7* 8.3* 8.5*  HCT 23.2* 23.2* 25.0*  MCV 79.5* 76.8* 76.9*  PLT 88* 73* 136*    Basic Metabolic Panel: Recent Labs  Lab 01/04/19 0407 01/05/19 0619 01/06/19 0846 01/07/19 0434 01/07/19 1910 01/08/19 0429 01/08/19 1710 01/09/19 0432  NA 142 142 137 139  --  138  --  134*  K 4.5 3.6 3.2* 2.9* 3.2* 5.6* 4.9 4.5  CL 112* 113*  107 103  --  103  --  98  CO2 22 21* 20* 25  --  26  --  27  GLUCOSE 76 100* 424* 506*  --  253*  --  68*  BUN 6 <5* 8 7  --  6  --  7  CREATININE 0.81 0.78 0.86 0.88  --  0.77  --  0.91  CALCIUM 7.4* 7.4* 7.4* 7.4*  --  7.6*  --  7.9*  MG 2.1  --  1.5* 1.7  --  1.6*  --  1.7    GFR: Estimated Creatinine Clearance: 47.2 mL/min (by C-G formula based on SCr of 0.91 mg/dL).  Liver Function Tests: No results for input(s): AST, ALT, ALKPHOS, BILITOT, PROT, ALBUMIN in the last 168 hours. No results for input(s): LIPASE, AMYLASE in the last 168 hours. No results for input(s): AMMONIA in the last 168 hours.  Coagulation Profile: No results for input(s): INR, PROTIME in the last 168 hours.  Cardiac Enzymes: No results for input(s): CKTOTAL, CKMB, CKMBINDEX, TROPONINI in the last 168 hours.  BNP (last 3 results) No results for input(s): PROBNP in the last 8760 hours.  HbA1C: Recent Labs    01/07/19 0654  HGBA1C 5.1    CBG: Recent Labs  Lab 01/09/19 0058 01/09/19 0509 01/09/19 0531 01/09/19 0548 01/09/19 0839  GLUCAP 91 68* 60* 86 111*    Lipid Profile: No results for input(s): CHOL, HDL, LDLCALC, TRIG, CHOLHDL, LDLDIRECT in the last 72 hours.  Thyroid Function Tests: No results for input(s): TSH, T4TOTAL, FREET4, T3FREE, THYROIDAB in the last 72 hours.  Anemia Panel: No results for input(s): VITAMINB12, FOLATE, FERRITIN, TIBC, IRON, RETICCTPCT in the last 72 hours.  Urine analysis:    Component Value Date/Time    COLORURINE YELLOW 01/04/2019 2048   APPEARANCEUR HAZY (A) 01/04/2019 2048   LABSPEC 1.015 01/04/2019 2048   PHURINE 7.0 01/04/2019 2048   GLUCOSEU NEGATIVE 01/04/2019 2048   HGBUR SMALL (A) 01/04/2019 2048   BILIRUBINUR NEGATIVE 01/04/2019 2048   KETONESUR NEGATIVE 01/04/2019 2048   PROTEINUR 30 (A) 01/04/2019 2048   UROBILINOGEN 1.0 07/27/2015 1052   NITRITE NEGATIVE 01/04/2019 2048   LEUKOCYTESUR NEGATIVE 01/04/2019 2048    Sepsis Labs: Lactic Acid, Venous    Component Value Date/Time   LATICACIDVEN 1.6 01/01/2019 0953    MICROBIOLOGY: Recent Results (from the past 240 hour(s))  Gastrointestinal Panel by PCR , Stool     Status: None   Collection Time: 12/30/18 10:17 PM  Result Value Ref Range Status   Campylobacter species NOT DETECTED NOT DETECTED Final   Plesimonas shigelloides NOT DETECTED NOT DETECTED Final   Salmonella species NOT DETECTED NOT DETECTED Final   Yersinia enterocolitica NOT DETECTED NOT DETECTED Final   Vibrio species NOT DETECTED NOT DETECTED Final   Vibrio cholerae NOT DETECTED NOT DETECTED Final   Enteroaggregative E coli (EAEC) NOT DETECTED NOT DETECTED Final   Enteropathogenic E coli (EPEC) NOT DETECTED NOT DETECTED Final   Enterotoxigenic E coli (ETEC) NOT DETECTED NOT DETECTED Final   Shiga like toxin producing E coli (STEC) NOT DETECTED NOT DETECTED Final   Shigella/Enteroinvasive E coli (EIEC) NOT DETECTED NOT DETECTED Final   Cryptosporidium NOT DETECTED NOT DETECTED Final   Cyclospora cayetanensis NOT DETECTED NOT DETECTED Final   Entamoeba histolytica NOT DETECTED NOT DETECTED Final   Giardia lamblia NOT DETECTED NOT DETECTED Final   Adenovirus F40/41 NOT DETECTED NOT DETECTED Final   Astrovirus NOT DETECTED NOT DETECTED Final  Norovirus GI/GII NOT DETECTED NOT DETECTED Final   Rotavirus A NOT DETECTED NOT DETECTED Final   Sapovirus (I, II, IV, and V) NOT DETECTED NOT DETECTED Final    Comment: Performed at [facility], [address] [place], [place] [address]  Stool culture (children & immunocomp patients)     Status: None   Collection Time: 12/30/18 10:17 PM  Result Value Ref Range Status   Salmonella/Shigella Screen Final report  Final   Campylobacter Culture Final report  Final   E coli, Shiga toxin Assay Negative Negative Final    Comment: (NOTE) Performed At: [facility] [address] [place], [place] [id number] Rush Farmer MD [id number]   STOOL CULTURE REFLEX - RSASHR     Status: None   Collection Time: 12/30/18 10:17 PM  Result Value Ref Range Status   Stool Culture result 1 (RSASHR) Comment  Final    Comment: (NOTE) No Salmonella or Shigella recovered. Performed At: [facility] [address] [place], [place] [id number] Rush Farmer MD [id number]   STOOL CULTURE Reflex - CMPCXR     Status: None   Collection Time: 12/30/18 10:17 PM  Result Value Ref Range Status   Stool Culture result 1 (CMPCXR) Comment  Final    Comment: (NOTE) No Campylobacter species isolated. Performed At: [facility] [place], [place] [id number] Rush Farmer MD [id number]   Culture, blood (routine x 2)     Status: None (Preliminary result)   Collection Time: 01/04/19  6:38 PM  Result Value Ref Range Status   Specimen Description BLOOD LEFT HAND  Final   Special Requests   Final    BOTTLES DRAWN AEROBIC ONLY Blood Culture adequate volume   Culture   Final    NO GROWTH 4 DAYS Performed at [facility], [address], [place], [place] [address]    Report Status PENDING  Incomplete  Culture, blood (routine x 2)     Status: None (Preliminary result)   Collection Time: 01/04/19  6:38 PM  Result Value Ref Range Status   Specimen Description BLOOD RIGHT HAND  Final   Special Requests   Final    BOTTLES DRAWN AEROBIC ONLY Blood Culture results may not be optimal due to an excessive volume of blood received in culture bottles   Culture   Final    NO  GROWTH 4 DAYS Performed at [facility], [place] [address], [place], [place] [address]    Report Status PENDING  Incomplete  Culture, Urine     Status: Abnormal   Collection Time: 01/04/19  8:48 PM  Result Value Ref Range Status   Specimen Description URINE, RANDOM  Final   Special Requests   Final    NONE Performed at [facility], [place] [address], [place], [place] [address]    Culture (A)  Final    >=100,000 COLONIES/mL VANCOMYCIN RESISTANT ENTEROCOCCUS ISOLATED   Report Status 01/07/2019 FINAL  Final   Organism ID, Bacteria VANCOMYCIN RESISTANT ENTEROCOCCUS ISOLATED (A)  Final      Susceptibility   Vancomycin resistant enterococcus isolated - MIC*    AMPICILLIN >=32 RESISTANT Resistant     LEVOFLOXACIN >=8 RESISTANT Resistant     NITROFURANTOIN 32 SENSITIVE Sensitive     VANCOMYCIN >=32 RESISTANT Resistant     LINEZOLID 2 SENSITIVE Sensitive     * >=100,000 COLONIES/mL VANCOMYCIN RESISTANT ENTEROCOCCUS ISOLATED    RADIOLOGY STUDIES/RESULTS: Ct Abdomen Pelvis Wo Contrast  Result  Date: 12/29/2018 CLINICAL DATA:  Abdomen distension two days ago here for food poisoning Allergic to iv contrast no oral contrast per MD EXAM: CT ABDOMEN AND PELVIS WITHOUT CONTRAST TECHNIQUE: Multidetector CT imaging of the abdomen and pelvis was performed following the standard protocol without IV contrast. COMPARISON:  12/27/2018 FINDINGS: Lower chest: There is bibasilar atelectasis. Hepatobiliary: The liver is homogeneous. Status post cholecystectomy. Pancreas: Pancreas is atrophic. Coarse calcifications throughout the pancreatic body and tail suggests history of chronic pancreatitis. Spleen: Normal in size without focal abnormality. Adrenals/Urinary Tract: Adrenal glands are unremarkable. Kidneys are normal, without renal calculi, focal lesion, or hydronephrosis. Bladder is unremarkable. Stomach/Bowel: Stomach is unremarkable. Small bowel loops are normal in appearance. There are numerous colonic  diverticula. No acute diverticulitis. Significant stool within the rectosigmoid colon. There has been some improvement in the appearance of thickening of the rectosigmoid colon. No perirectal abscess. Vascular/Lymphatic: There is dense atherosclerotic calcification of the abdominal aorta not associated with aneurysm. No retroperitoneal or mesenteric adenopathy. Reproductive: The uterus is present. Multiple mural calcifications are present. No adnexal mass. Other: No ascites. No abdominal wall hernia. Note is made of fat within the LEFT inguinal ring, likely a lipoma. Diffuse body wall edema. Musculoskeletal: Remote wedge compression fractures of T12, L1, and L3. There is 2 millimeters anterolisthesis of L4 on L5. Degenerative changes are identified at L4-5 and L5-S1. No suspicious lytic or blastic lesions are identified. IMPRESSION: 1. Significant stool within the rectosigmoid colon. There has been some improvement in the appearance of thickening of the rectosigmoid colon. 2. Diverticulosis without acute diverticulitis. 3. Status post cholecystectomy. 4. Changes of chronic pancreatitis. 5. Remote wedge compression fractures of T12, L1, and L3. Aortic Atherosclerosis (ICD10-I70.0). Electronically Signed   By: Nolon Nations M.D.   On: 12/29/2018 13:12   Dg Chest Port 1 View  Result Date: 01/08/2019 CLINICAL DATA:  Shortness of breath, asthma, hypertension, history of stroke, GERD, former smoker EXAM: PORTABLE CHEST 1 VIEW COMPARISON:  Portable exam 0650 hours compared to 01/06/2019 FINDINGS: Enlargement of cardiac silhouette. Stable mediastinal contours. Diffuse BILATERAL pulmonary infiltrates question pulmonary edema though infection is not excluded. No gross pleural effusion or pneumothorax. Bones demineralized with prior thoracic spinal fixation. BILATERAL glenohumeral degenerative changes. Old RIGHT rib fractures. Chronic lytic lesion at proximal RIGHT humerus. IMPRESSION: Enlargement of cardiac silhouette  with peripheral cyst and diffuse pulmonary infiltrates favoring pulmonary edema though infection is not excluded. No interval change. Electronically Signed   By: Lavonia Dana M.D.   On: 01/08/2019 09:51   Dg Chest Port 1 View  Result Date: 01/06/2019 CLINICAL DATA:  Cough and congestion. EXAM: PORTABLE CHEST 1 VIEW COMPARISON:  January 05, 2019 FINDINGS: Stable cardiomegaly. Stable small pleural effusions. Increasing pulmonary opacities suggestive of worsening edema. Persistent lytic lesion in the proximal right humerus. IMPRESSION: 1. Cardiomegaly and worsening pulmonary edema.  Small effusions. 2. Persistent lytic lesion in the proximal right humerus. Gout was suggested on previous right shoulder x-rays. Electronically Signed   By: Dorise Bullion III M.D   On: 01/06/2019 09:09   Dg Chest Port 1 View  Result Date: 01/05/2019 CLINICAL DATA:  Five day history of shortness of breath. Acute mid chest pain that began today. EXAM: PORTABLE CHEST 1 VIEW COMPARISON:  01/04/2019 and earlier. FINDINGS: Cardiac silhouette moderately enlarged, unchanged. Increasing interstitial opacities superimposed upon the previously identified interstitial lung disease. Stable small BILATERAL pleural effusions. Prior thoracic spine corpectomy and fusion. Lytic lesion in the proximal RIGHT humerus. Severe degenerative changes involving both  shoulders. IMPRESSION: 1. Developing interstitial pulmonary edema is suspected, superimposed upon the previously identified interstitial lung disease. 2. Stable small BILATERAL pleural effusions. 3. Lytic lesion involving the proximal RIGHT humerus. Does the patient have a known malignancy or multiple myeloma? Electronically Signed   By: Evangeline Dakin M.D.   On: 01/05/2019 08:58   Dg Chest Port 1 View  Result Date: 01/04/2019 CLINICAL DATA:  Difficulty breathing EXAM: PORTABLE CHEST 1 VIEW COMPARISON:  December 29, 2018; November 13, 2018 FINDINGS: Diffuse interstitial thickening remains  stable. There is no frank edema consolidation. Heart is upper normal in size with pulmonary vascularity normal. No adenopathy. Extensive postoperative change in the thoracic spine noted. Evidence of old right-sided rib trauma laterally. There is arthropathy in the shoulders, stable. Cystic area in the proximal right humerus is stable. IMPRESSION: Diffuse interstitial thickening, likely reflecting chronic fibrosis. No frank edema or consolidation evident. Stable cardiac silhouette. Extensive postoperative change in the thoracic spine. Advanced arthropathy in the shoulders. Prior rib trauma on the right. Electronically Signed   By: Lowella Grip III M.D.   On: 01/04/2019 08:51   Dg Chest Port 1 View  Result Date: 12/29/2018 CLINICAL DATA:  63 year old female with altered mental status, fever, decreased p.o. intake. EXAM: PORTABLE CHEST 1 VIEW COMPARISON:  Portable chest 11/13/2018 and earlier. FINDINGS: Portable AP semi upright view at 1320 hours. Sequelae of previous thoracic spine corpectomy and posterior fusion. Hardware appears stable. Stable visualized osseous structures. Stable lung volumes and mediastinal contours. Visualized tracheal air column is within normal limits. Chronic increased pulmonary interstitial markings appear stable since 2019. No pneumothorax, pleural effusion or acute pulmonary opacity. Mildly increased bowel gas in the abdomen. Stable cholecystectomy clips. IMPRESSION: Stable.  No acute cardiopulmonary abnormality. Electronically Signed   By: Genevie Ann M.D.   On: 12/29/2018 14:03   Dg Abd Portable 1v  Result Date: 12/30/2018 CLINICAL DATA:  Abdominal pain EXAM: PORTABLE ABDOMEN - 1 VIEW COMPARISON:  CT 12/29/2018 FINDINGS: Surgical hardware in the lower thoracic spine. Dense aortic atherosclerosis. Mild diffuse increased bowel gas without obstructive pattern. Possible bowel wall thickening at the hepatic flexure and descending colon. No radiopaque calculi. IMPRESSION: Diffuse  increased bowel gas without obstructive pattern. There may be bowel wall edema at the hepatic flexure and descending colon as may be seen with colitis Electronically Signed   By: Donavan Foil M.D.   On: 12/30/2018 21:45   Dg Abd Portable 2v  Result Date: 01/01/2019 CLINICAL DATA:  Vomiting. EXAM: PORTABLE ABDOMEN - 2 VIEW COMPARISON:  Radiograph of December 30, 2018. FINDINGS: No small bowel dilatation is noted. Mildly dilated colon loop is seen in right side of abdomen which most likely represents ileus. Status post cholecystectomy. There is no evidence of free air. No radio-opaque calculi or other significant radiographic abnormality is seen. IMPRESSION: Mildly dilated colonic loops seen in right side of abdomen which may represent ileus. Electronically Signed   By: Marijo Conception, M.D.   On: 01/01/2019 14:50     LOS: 11 days   Signature  Lala Lund M.D on 01/09/2019 at 9:12 AM   -  To page go to [email or web address]

## 2019-01-10 ENCOUNTER — Inpatient Hospital Stay (HOSPITAL_COMMUNITY): Payer: Medicaid Other

## 2019-01-10 LAB — BASIC METABOLIC PANEL
Anion gap: 11 (ref 5–15)
BUN: 12 mg/dL (ref 6–20)
CO2: 33 mmol/L — ABNORMAL HIGH (ref 22–32)
Calcium: 7.5 mg/dL — ABNORMAL LOW (ref 8.9–10.3)
Chloride: 89 mmol/L — ABNORMAL LOW (ref 98–111)
Creatinine, Ser: 1.22 mg/dL — ABNORMAL HIGH (ref 0.44–1.00)
GFR calc Af Amer: 56 mL/min — ABNORMAL LOW (ref >60)
GFR, EST NON AFRICAN AMERICAN: 48 mL/min — AB (ref >60)
Glucose, Bld: 267 mg/dL — ABNORMAL HIGH (ref 70–99)
Potassium: 3.3 mmol/L — ABNORMAL LOW (ref 3.5–5.1)
Sodium: 133 mmol/L — ABNORMAL LOW (ref 135–145)

## 2019-01-10 LAB — CBC
HCT: 24.5 % — ABNORMAL LOW (ref 36.0–46.0)
Hemoglobin: 8.3 g/dL — ABNORMAL LOW (ref 12.0–15.0)
MCH: 25.8 pg — ABNORMAL LOW (ref 26.0–34.0)
MCHC: 33.9 g/dL (ref 30.0–36.0)
MCV: 76.1 fL — AB (ref 80.0–100.0)
Platelets: 187 10*3/uL (ref 150–400)
RBC: 3.22 MIL/uL — ABNORMAL LOW (ref 3.87–5.11)
RDW: 18.8 % — ABNORMAL HIGH (ref 11.5–15.5)
WBC: 6.9 10*3/uL (ref 4.0–10.5)
nRBC: 0 % (ref 0.0–0.2)

## 2019-01-10 LAB — GLUCOSE, CAPILLARY
GLUCOSE-CAPILLARY: 515 mg/dL — AB (ref 70–99)
Glucose-Capillary: 251 mg/dL — ABNORMAL HIGH (ref 70–99)
Glucose-Capillary: 262 mg/dL — ABNORMAL HIGH (ref 70–99)
Glucose-Capillary: 265 mg/dL — ABNORMAL HIGH (ref 70–99)
Glucose-Capillary: 324 mg/dL — ABNORMAL HIGH (ref 70–99)
Glucose-Capillary: 365 mg/dL — ABNORMAL HIGH (ref 70–99)
Glucose-Capillary: 419 mg/dL — ABNORMAL HIGH (ref 70–99)
Glucose-Capillary: 444 mg/dL — ABNORMAL HIGH (ref 70–99)
Glucose-Capillary: 461 mg/dL — ABNORMAL HIGH (ref 70–99)
Glucose-Capillary: 581 mg/dL (ref 70–99)

## 2019-01-10 LAB — MAGNESIUM: Magnesium: 1.5 mg/dL — ABNORMAL LOW (ref 1.7–2.4)

## 2019-01-10 MED ORDER — SODIUM CHLORIDE 0.9 % IV SOLN
INTRAVENOUS | Status: DC
  Administered 2019-01-10: 20:00:00 via INTRAVENOUS

## 2019-01-10 MED ORDER — INSULIN ASPART 100 UNIT/ML ~~LOC~~ SOLN
15.0000 [IU] | Freq: Once | SUBCUTANEOUS | Status: AC
  Administered 2019-01-10: 15 [IU] via SUBCUTANEOUS

## 2019-01-10 MED ORDER — DEXTROSE 50 % IV SOLN: 25.0000 mL | INTRAVENOUS | Status: DC | PRN

## 2019-01-10 MED ORDER — MAGNESIUM SULFATE 2 GM/50ML IV SOLN
2.0000 g | Freq: Once | INTRAVENOUS | Status: AC
  Administered 2019-01-10: 2 g via INTRAVENOUS
  Filled 2019-01-10: qty 50

## 2019-01-10 MED ORDER — INSULIN DETEMIR 100 UNIT/ML ~~LOC~~ SOLN
15.0000 [IU] | Freq: Once | SUBCUTANEOUS | Status: AC
  Administered 2019-01-10: 15 [IU] via SUBCUTANEOUS
  Filled 2019-01-10: qty 0.15

## 2019-01-10 MED ORDER — POTASSIUM CHLORIDE CRYS ER 20 MEQ PO TBCR: 40.0000 meq | EXTENDED_RELEASE_TABLET | Freq: Once | ORAL | Status: DC

## 2019-01-10 MED ORDER — POTASSIUM CHLORIDE 20 MEQ PO PACK
60.0000 meq | PACK | Freq: Once | ORAL | Status: AC
  Administered 2019-01-10: 60 meq via ORAL
  Filled 2019-01-10: qty 3

## 2019-01-10 MED ORDER — INSULIN REGULAR(HUMAN) IN NACL 100-0.9 UT/100ML-% IV SOLN
INTRAVENOUS | Status: DC
  Administered 2019-01-10: 4 [IU]/h via INTRAVENOUS
  Filled 2019-01-10: qty 100

## 2019-01-10 MED ORDER — POTASSIUM CHLORIDE CRYS ER 20 MEQ PO TBCR: 20.0000 meq | EXTENDED_RELEASE_TABLET | Freq: Once | ORAL | Status: DC

## 2019-01-10 MED ORDER — SPIRONOLACTONE 25 MG PO TABS: 25.0000 mg | ORAL_TABLET | Freq: Every day | ORAL | Status: AC

## 2019-01-10 MED ORDER — METHYLPREDNISOLONE SODIUM SUCC 40 MG IJ SOLR
40.0000 mg | Freq: Every day | INTRAMUSCULAR | Status: DC
  Administered 2019-01-10: 40 mg via INTRAVENOUS
  Filled 2019-01-10: qty 1

## 2019-01-10 MED ORDER — INSULIN REGULAR BOLUS VIA INFUSION
0.0000 [IU] | Freq: Three times a day (TID) | INTRAVENOUS | Status: DC
  Filled 2019-01-10: qty 10

## 2019-01-10 MED ORDER — PREDNISONE 5 MG PO TABS
5.0000 mg | ORAL_TABLET | Freq: Every day | ORAL | Status: DC
  Administered 2019-01-11 – 2019-01-16 (×6): 5 mg via ORAL
  Filled 2019-01-10 (×7): qty 1

## 2019-01-10 NOTE — Plan of Care (Signed)

## 2019-01-10 NOTE — Progress Notes (Signed)
PROGRESS NOTE        PATIENT DETAILS Name: Amber [name] Age: 63 y.o. Sex: female Date of Birth: 1956-04-05 Admit Date: 12/29/2018 Admitting Physician Rush Farmer, MD PCP:Sun, Mikeal Hawthorne, MD  Brief Narrative:  Patient is a 63 y.o. female with history of chronic diastolic heart failure, PAF on anticoagulation, embolic CVA,  seizure disorder, rheumatoid arthritis, prior back back surgeries, chronic pain syndrome-presented to the ED for hypotension, was found to have septic shock secondary to gram-negative bacteremia.  See below for further details  Subjective: Patient in bed, appears comfortable, denies any headache, no fever, no chest pain or pressure, no shortness of breath , no abdominal pain. No focal weakness.   Assessment/Plan:  Septic shock secondary to E. coli bacteremia: Sepsis pathophysiology has resolved, was initially on Rocephin now Bactrim.  Continue total 10-day course, finished her antibiotics.  Cough-shortness of breath and orthopnea.  Combination of hospital-acquired influenza infection + dCHF.  Placed on Tamiflu, proBNP elevated, stopped IV fluids, is being diuresed with high-dose IV Lasix along with Zaroxolyn , symptoms only minimally improved, encouraged to sit up in chair, have added  Flutter valve added for pulmonary toiletry along with nebulizer treatments and oxygen for supportive care.  Despite multiple sessions of counseling patient is noncompliant about using flutter valve and sitting up in chair, re-counseled again on 01/09/2019.  All narcotics were stopped on 01/09/2019, this morning on 01/10/2019 she overall appears much improved, is taking much deeper breaths, also has diuresed well yesterday and today lung sounds much improved.  Encouraged to continue sitting up in chair and using flutter valve, continue to hold narcotics continue to gently diurese.  Problem is her extreme frail status, being bedbound virtually 24/7, poor cough flex along with  chronic use of scheduled high-dose narcotics and poor ability to clear her pulmonary secretions.  Have been encouraging her daily to sit up in the chair and use flutter valve which so far she has not been doing see note above.  Her long-term prognosis appears very poor due to her underlying extremely frail status however patient and family continue to have unreasonable expectations.  Also seen by palliative care will involve pulmonary critical care for another opinion.  Want to be full code.  Called family updated they still continue to have unreasonable expectations but after extensive counseling on 01/09/2019 and they understand the situation better now.   ESBL enterococcus UTI.  More likely colonization.  Currently afebrile without leukocytosis despite being on no antibiotics at this time.  She has been given 1 dose of oral fosfomycin on 01/08/2019, will continue to monitor clinically.    Acute on chronic diastolic CHF.  EF 65% on echocardiogram in 2019.  Repeat IV Lasix at higher dose with repeat dose of Zaroxolyn on 01/09/2019, monitor electrolytes, continue home dose beta-blocker, encouraged to sit up in chair, oxygen nebulizer treatments PRN.    Abdominal pain: Much improved-she had numerous BMs overnight after she was started on Dulcolax x1, senna/MiraLAX.  She has chronic abdominal pain at baseline from chronic pancreatitis.    Lactic acidosis: Secondary to sepsis, has normalized.  Elevated troponin: Trend is flat-not consistent with ACS-likely demand ischemia.  EKG without acute changes.  Given poor overall health-bedbound status-best served by conservative management.  PAF: Sinus rhythm-has prior history of embolic stroke-continue Eliquis.  Chronic thrombocytopenia: Unclear etiology-platelets close to usual baseline.  Follow.  Anemia: Suspect has chronic anemia at baseline-worsened by acute illness, has required 1 unit of PRBC transfusion during this hospital stay.  Hemoglobin low but  stable-follow.  History of rheumatoid arthritis with chronic steroid use: Appears to be on chronic prednisone-currently on stress dose IV Solu-Cortef-we will slowly taper over the next few days.  Chronic pancreatitis: Continue Creon.  History of seizures: Continue Depakote.  History of CVA: Reviewed records in care everywhere-she has had prior embolic strokes-difficult neurologic exam due to lethargy/weakness-but appears nonfocal-on Eliquis  Generalized deconditioning/worsening debility: Per patient-she has not ambulated in 4 years-mostly bed to wheelchair bound.  She is going to talk to her family about SNF rehab-as per social work-her family wants to take her home.    Chronic pain syndrome (back pain, neck pain, abdominal pain): Had stool burden for which she has been placed on regimen, narcotics were cut in half initially and was stopped them on 01/09/2019 as she continues to have extremely poor cough reflex and appears somnolent most of the time.  Patient was given an option of stopping narcotics or declining further she agreed to stopping narcotics.  RN in room on 01/09/2019.  Hypokalemia and hypomagnesemia.  replaced will continue to monitor.    DVT Prophylaxis: Full dose anticoagulation with Eliquis  Code Status: Full code  Family Communication: None at bedside  Disposition Plan: Remain inpatient-hopefully discharge tomorrow if clinical improvement continues.  Antimicrobial agents: Anti-infectives (From admission, onward)   Start     Dose/Rate Route Frequency Ordered Stop   01/08/19 1345  sulfamethoxazole-trimethoprim (BACTRIM DS,SEPTRA DS) 800-160 MG per tablet 1 tablet     1 tablet Oral Every 12 hours 01/08/19 1334 01/09/19 2313   01/05/19 1000  oseltamivir (TAMIFLU) capsule 30 mg     30 mg Oral 2 times daily 01/05/19 0826 01/09/19 2312   01/04/19 1500  cefTRIAXone (ROCEPHIN) 2 g in sodium chloride 0.9 % 100 mL IVPB  Status:  Discontinued     2 g 200 mL/hr over 30  Minutes Intravenous Every 24 hours 01/04/19 1416 01/07/19 1049   01/04/19 1500  metroNIDAZOLE (FLAGYL) IVPB 500 mg  Status:  Discontinued     500 mg 100 mL/hr over 60 Minutes Intravenous Every 8 hours 01/04/19 1416 01/07/19 1049   01/03/19 1030  sulfamethoxazole-trimethoprim (BACTRIM DS,SEPTRA DS) 800-160 MG per tablet 1 tablet  Status:  Discontinued     1 tablet Oral Every 12 hours 01/03/19 1019 01/04/19 1416   01/03/19 0000  sulfamethoxazole-trimethoprim (BACTRIM DS,SEPTRA DS) 800-160 MG tablet     1 tablet Oral Every 12 hours 01/03/19 1059     12/31/18 1330  vancomycin (VANCOCIN) IVPB 750 mg/150 ml premix  Status:  Discontinued     750 mg 150 mL/hr over 60 Minutes Intravenous Every 48 hours 12/29/18 1738 12/30/18 1127   12/30/18 1200  cefTRIAXone (ROCEPHIN) 2 g in sodium chloride 0.9 % 100 mL IVPB  Status:  Discontinued     2 g 200 mL/hr over 30 Minutes Intravenous Every 24 hours 12/30/18 1127 01/03/19 1019   12/30/18 0130  ceFEPIme (MAXIPIME) 1 g in sodium chloride 0.9 % 100 mL IVPB  Status:  Discontinued     1 g 200 mL/hr over 30 Minutes Intravenous Every 12 hours 12/29/18 1750 12/30/18 1127   12/29/18 1715  ceFEPIme (MAXIPIME) 2 g in sodium chloride 0.9 % 100 mL IVPB  Status:  Discontinued     2 g 200 mL/hr over 30 Minutes Intravenous  Once 12/29/18 1706  12/29/18 1750   12/29/18 1715  metroNIDAZOLE (FLAGYL) IVPB 500 mg  Status:  Discontinued     500 mg 100 mL/hr over 60 Minutes Intravenous Every 8 hours 12/29/18 1706 12/30/18 1127   12/29/18 1715  vancomycin (VANCOCIN) IVPB 1000 mg/200 mL premix  Status:  Discontinued     1,000 mg 200 mL/hr over 60 Minutes Intravenous  Once 12/29/18 1706 12/29/18 1750   12/29/18 1230  ceFEPIme (MAXIPIME) 2 g in sodium chloride 0.9 % 100 mL IVPB     2 g 200 mL/hr over 30 Minutes Intravenous  Once 12/29/18 1226 12/29/18 1424   12/29/18 1230  metroNIDAZOLE (FLAGYL) IVPB 500 mg  Status:  Discontinued     500 mg 100 mL/hr over 60 Minutes Intravenous  Every 8 hours 12/29/18 1226 12/29/18 1706   12/29/18 1230  vancomycin (VANCOCIN) IVPB 1000 mg/200 mL premix     1,000 mg 200 mL/hr over 60 Minutes Intravenous  Once 12/29/18 1226 12/29/18 1551      Procedures: None  CONSULTS: Palliative care, pulmonary  Time spent: 25- minutes-Greater than 50% of this time was spent in counseling, explanation of diagnosis, planning of further management, and coordination of care.  MEDICATIONS: Scheduled Meds: . allopurinol  300 mg Oral Daily  . apixaban  2.5 mg Oral BID  . feeding supplement  1 Container Oral TID BM  . insulin aspart  0-9 Units Subcutaneous TID WC  . lipase/protease/amylase  36,000 Units Oral TID AC  . mouth rinse  15 mL Mouth Rinse BID  . methylPREDNISolone (SOLU-MEDROL) injection  40 mg Intravenous Daily  . metoprolol succinate  25 mg Oral Daily  . pantoprazole  40 mg Oral BID  . potassium chloride  20 mEq Oral Once  . potassium chloride  40 mEq Oral Once  . [START ON 01/12/2019] predniSONE  5 mg Oral Q breakfast  . spironolactone  25 mg Oral Daily  . valproic acid  250 mg Oral TID   Continuous Infusions: . magnesium sulfate 1 - 4 g bolus IVPB     PRN Meds:.acetaminophen, diltiazem, hydrOXYzine, ipratropium-albuterol, naLOXone (NARCAN)  injection, simethicone   PHYSICAL EXAM:  Vital signs: Vitals:   01/09/19 2223 01/10/19 0000 01/10/19 0623 01/10/19 0730  BP: 101/72  100/84   Pulse: 96 95 89   Resp: (!) 37 (!) 30 (!) 24   Temp: 99.4 F (37.4 C)  (!) 97.5 F (36.4 C) (!) 97.3 F (36.3 C)  TempSrc: Oral  Oral Oral  SpO2: 98% 97% 97%   Weight:      Height:       Filed Weights   12/29/18 1229 12/29/18 2040  Weight: 40.5 kg 50.1 kg   Body mass index is 21.57 kg/m.   Exam  Awake Alert, frail African-American female who looks much older than her stated age, however overall looks better .AT,PERRAL Supple Neck,No JVD, No cervical lymphadenopathy appriciated.  Symmetrical Chest wall movement, Good air  movement bilaterally, minimal bibasilar Rales RRR,No Gallops, Rubs or new Murmurs, No Parasternal Heave +ve B.Sounds, Abd Soft, No tenderness, No organomegaly appriciated, No rebound - guarding or rigidity. No Cyanosis, Clubbing or edema, No new Rash or bruise   I have personally reviewed following labs and imaging studies  LABORATORY DATA: CBC: Recent Labs  Lab 01/05/19 0619 01/06/19 0432 01/08/19 0429  WBC 6.4 5.0 8.9  HGB 7.7* 8.3* 8.5*  HCT 23.2* 23.2* 25.0*  MCV 79.5* 76.8* 76.9*  PLT 88* 73* 136*    Basic Metabolic Panel:  Recent Labs  Lab 01/06/19 0846 01/07/19 0434 01/07/19 1910 01/08/19 0429 01/08/19 1710 01/09/19 0432 01/10/19 0741  NA 137 139  --  138  --  134* 133*  K 3.2* 2.9* 3.2* 5.6* 4.9 4.5 3.3*  CL 107 103  --  103  --  98 89*  CO2 20* 25  --  26  --  27 33*  GLUCOSE 424* 506*  --  253*  --  68* 267*  BUN 8 7  --  6  --  7 12  CREATININE 0.86 0.88  --  0.77  --  0.91 1.22*  CALCIUM 7.4* 7.4*  --  7.6*  --  7.9* 7.5*  MG 1.5* 1.7  --  1.6*  --  1.7 1.5*    GFR: Estimated Creatinine Clearance: 35.2 mL/min (A) (by C-G formula based on SCr of 1.22 mg/dL (H)).  Liver Function Tests: No results for input(s): AST, ALT, ALKPHOS, BILITOT, PROT, ALBUMIN in the last 168 hours. No results for input(s): LIPASE, AMYLASE in the last 168 hours. No results for input(s): AMMONIA in the last 168 hours.  Coagulation Profile: No results for input(s): INR, PROTIME in the last 168 hours.  Cardiac Enzymes: No results for input(s): CKTOTAL, CKMB, CKMBINDEX, TROPONINI in the last 168 hours.  BNP (last 3 results) No results for input(s): PROBNP in the last 8760 hours.  HbA1C: No results for input(s): HGBA1C in the last 72 hours.  CBG: Recent Labs  Lab 01/09/19 2158 01/09/19 2324 01/10/19 0039 01/10/19 0424 01/10/19 0741  GLUCAP 567* 526* 365* 265* 251*    Lipid Profile: No results for input(s): CHOL, HDL, LDLCALC, TRIG, CHOLHDL, LDLDIRECT in the last  72 hours.  Thyroid Function Tests: No results for input(s): TSH, T4TOTAL, FREET4, T3FREE, THYROIDAB in the last 72 hours.  Anemia Panel: No results for input(s): VITAMINB12, FOLATE, FERRITIN, TIBC, IRON, RETICCTPCT in the last 72 hours.  Urine analysis:    Component Value Date/Time   COLORURINE YELLOW 01/04/2019 2048   APPEARANCEUR HAZY (A) 01/04/2019 2048   LABSPEC 1.015 01/04/2019 2048   PHURINE 7.0 01/04/2019 2048   GLUCOSEU NEGATIVE 01/04/2019 2048   HGBUR SMALL (A) 01/04/2019 2048   BILIRUBINUR NEGATIVE 01/04/2019 2048   KETONESUR NEGATIVE 01/04/2019 2048   PROTEINUR 30 (A) 01/04/2019 2048   UROBILINOGEN 1.0 07/27/2015 1052   NITRITE NEGATIVE 01/04/2019 2048   LEUKOCYTESUR NEGATIVE 01/04/2019 2048    Sepsis Labs: Lactic Acid, Venous    Component Value Date/Time   LATICACIDVEN 1.6 01/01/2019 0953    MICROBIOLOGY: Recent Results (from the past 240 hour(s))  Culture, blood (routine x 2)     Status: None   Collection Time: 01/04/19  6:38 PM  Result Value Ref Range Status   Specimen Description BLOOD LEFT HAND  Final   Special Requests   Final    BOTTLES DRAWN AEROBIC ONLY Blood Culture adequate volume   Culture   Final    NO GROWTH 5 DAYS Performed at [facility], [place] [address] [place], [place] [address]    Report Status 01/09/2019 FINAL  Final  Culture, blood (routine x 2)     Status: None   Collection Time: 01/04/19  6:38 PM  Result Value Ref Range Status   Specimen Description BLOOD RIGHT HAND  Final   Special Requests   Final    BOTTLES DRAWN AEROBIC ONLY Blood Culture results may not be optimal due to an excessive volume of blood received in culture bottles  Culture   Final    NO GROWTH 5 DAYS Performed at [facility], [place] [address] [place], [place] [address]    Report Status 01/09/2019 FINAL  Final  Culture, Urine     Status: Abnormal   Collection Time: 01/04/19  8:48 PM  Result Value Ref Range Status   Specimen Description URINE,  RANDOM  Final   Special Requests   Final    NONE Performed at [facility], [place] [address], [place], [place] [address]    Culture (A)  Final    >=100,000 COLONIES/mL VANCOMYCIN RESISTANT ENTEROCOCCUS ISOLATED   Report Status 01/07/2019 FINAL  Final   Organism ID, Bacteria VANCOMYCIN RESISTANT ENTEROCOCCUS ISOLATED (A)  Final      Susceptibility   Vancomycin resistant enterococcus isolated - MIC*    AMPICILLIN >=32 RESISTANT Resistant     LEVOFLOXACIN >=8 RESISTANT Resistant     NITROFURANTOIN 32 SENSITIVE Sensitive     VANCOMYCIN >=32 RESISTANT Resistant     LINEZOLID 2 SENSITIVE Sensitive     * >=100,000 COLONIES/mL VANCOMYCIN RESISTANT ENTEROCOCCUS ISOLATED    RADIOLOGY STUDIES/RESULTS: Ct Abdomen Pelvis Wo Contrast  Result Date: 12/29/2018 CLINICAL DATA:  Abdomen distension two days ago here for food poisoning Allergic to iv contrast no oral contrast per MD EXAM: CT ABDOMEN AND PELVIS WITHOUT CONTRAST TECHNIQUE: Multidetector CT imaging of the abdomen and pelvis was performed following the standard protocol without IV contrast. COMPARISON:  12/27/2018 FINDINGS: Lower chest: There is bibasilar atelectasis. Hepatobiliary: The liver is homogeneous. Status post cholecystectomy. Pancreas: Pancreas is atrophic. Coarse calcifications throughout the pancreatic body and tail suggests history of chronic pancreatitis. Spleen: Normal in size without focal abnormality. Adrenals/Urinary Tract: Adrenal glands are unremarkable. Kidneys are normal, without renal calculi, focal lesion, or hydronephrosis. Bladder is unremarkable. Stomach/Bowel: Stomach is unremarkable. Small bowel loops are normal in appearance. There are numerous colonic diverticula. No acute diverticulitis. Significant stool within the rectosigmoid colon. There has been some improvement in the appearance of thickening of the rectosigmoid colon. No perirectal abscess. Vascular/Lymphatic: There is dense atherosclerotic calcification of  the abdominal aorta not associated with aneurysm. No retroperitoneal or mesenteric adenopathy. Reproductive: The uterus is present. Multiple mural calcifications are present. No adnexal mass. Other: No ascites. No abdominal wall hernia. Note is made of fat within the LEFT inguinal ring, likely a lipoma. Diffuse body wall edema. Musculoskeletal: Remote wedge compression fractures of T12, L1, and L3. There is 2 millimeters anterolisthesis of L4 on L5. Degenerative changes are identified at L4-5 and L5-S1. No suspicious lytic or blastic lesions are identified. IMPRESSION: 1. Significant stool within the rectosigmoid colon. There has been some improvement in the appearance of thickening of the rectosigmoid colon. 2. Diverticulosis without acute diverticulitis. 3. Status post cholecystectomy. 4. Changes of chronic pancreatitis. 5. Remote wedge compression fractures of T12, L1, and L3. Aortic Atherosclerosis (ICD10-I70.0). Electronically Signed   By: Nolon Nations M.D.   On: 12/29/2018 13:12   Dg Chest Port 1 View  Result Date: 01/10/2019 CLINICAL DATA:  Short of breath.  Chest pain.  Weakness. EXAM: PORTABLE CHEST 1 VIEW COMPARISON:  01/08/2019 FINDINGS: Mild cardiomegaly. Low volumes. Diffuse airspace opacities throughout both lungs. Small pleural effusions. Spinal stabilization hardware is stable. No pneumothorax or pleural effusion. IMPRESSION: Stable diffuse bilateral airspace opacities. Electronically Signed   By: Marybelle Killings M.D.   On: 01/10/2019 08:14   Dg Chest Port 1 View  Result Date: 01/08/2019 CLINICAL DATA:  Shortness of breath, asthma, hypertension,  history of stroke, GERD, former smoker EXAM: PORTABLE CHEST 1 VIEW COMPARISON:  Portable exam 0650 hours compared to 01/06/2019 FINDINGS: Enlargement of cardiac silhouette. Stable mediastinal contours. Diffuse BILATERAL pulmonary infiltrates question pulmonary edema though infection is not excluded. No gross pleural effusion or pneumothorax. Bones  demineralized with prior thoracic spinal fixation. BILATERAL glenohumeral degenerative changes. Old RIGHT rib fractures. Chronic lytic lesion at proximal RIGHT humerus. IMPRESSION: Enlargement of cardiac silhouette with peripheral cyst and diffuse pulmonary infiltrates favoring pulmonary edema though infection is not excluded. No interval change. Electronically Signed   By: Lavonia Dana M.D.   On: 01/08/2019 09:51   Dg Chest Port 1 View  Result Date: 01/06/2019 CLINICAL DATA:  Cough and congestion. EXAM: PORTABLE CHEST 1 VIEW COMPARISON:  January 05, 2019 FINDINGS: Stable cardiomegaly. Stable small pleural effusions. Increasing pulmonary opacities suggestive of worsening edema. Persistent lytic lesion in the proximal right humerus. IMPRESSION: 1. Cardiomegaly and worsening pulmonary edema.  Small effusions. 2. Persistent lytic lesion in the proximal right humerus. Gout was suggested on previous right shoulder x-rays. Electronically Signed   By: Dorise Bullion III M.D   On: 01/06/2019 09:09   Dg Chest Port 1 View  Result Date: 01/05/2019 CLINICAL DATA:  Five day history of shortness of breath. Acute mid chest pain that began today. EXAM: PORTABLE CHEST 1 VIEW COMPARISON:  01/04/2019 and earlier. FINDINGS: Cardiac silhouette moderately enlarged, unchanged. Increasing interstitial opacities superimposed upon the previously identified interstitial lung disease. Stable small BILATERAL pleural effusions. Prior thoracic spine corpectomy and fusion. Lytic lesion in the proximal RIGHT humerus. Severe degenerative changes involving both shoulders. IMPRESSION: 1. Developing interstitial pulmonary edema is suspected, superimposed upon the previously identified interstitial lung disease. 2. Stable small BILATERAL pleural effusions. 3. Lytic lesion involving the proximal RIGHT humerus. Does the patient have a known malignancy or multiple myeloma? Electronically Signed   By: Evangeline Dakin M.D.   On: 01/05/2019 08:58    Dg Chest Port 1 View  Result Date: 01/04/2019 CLINICAL DATA:  Difficulty breathing EXAM: PORTABLE CHEST 1 VIEW COMPARISON:  December 29, 2018; November 13, 2018 FINDINGS: Diffuse interstitial thickening remains stable. There is no frank edema consolidation. Heart is upper normal in size with pulmonary vascularity normal. No adenopathy. Extensive postoperative change in the thoracic spine noted. Evidence of old right-sided rib trauma laterally. There is arthropathy in the shoulders, stable. Cystic area in the proximal right humerus is stable. IMPRESSION: Diffuse interstitial thickening, likely reflecting chronic fibrosis. No frank edema or consolidation evident. Stable cardiac silhouette. Extensive postoperative change in the thoracic spine. Advanced arthropathy in the shoulders. Prior rib trauma on the right. Electronically Signed   By: Lowella Grip III M.D.   On: 01/04/2019 08:51   Dg Chest Port 1 View  Result Date: 12/29/2018 CLINICAL DATA:  64 year old female with altered mental status, fever, decreased p.o. intake. EXAM: PORTABLE CHEST 1 VIEW COMPARISON:  Portable chest 11/13/2018 and earlier. FINDINGS: Portable AP semi upright view at 1320 hours. Sequelae of previous thoracic spine corpectomy and posterior fusion. Hardware appears stable. Stable visualized osseous structures. Stable lung volumes and mediastinal contours. Visualized tracheal air column is within normal limits. Chronic increased pulmonary interstitial markings appear stable since 2019. No pneumothorax, pleural effusion or acute pulmonary opacity. Mildly increased bowel gas in the abdomen. Stable cholecystectomy clips. IMPRESSION: Stable.  No acute cardiopulmonary abnormality. Electronically Signed   By: Genevie Ann M.D.   On: 12/29/2018 14:03   Dg Abd Portable 1v  Result Date: 12/30/2018 CLINICAL DATA:  Abdominal pain EXAM: PORTABLE ABDOMEN - 1 VIEW COMPARISON:  CT 12/29/2018 FINDINGS: Surgical hardware in the lower thoracic spine.  Dense aortic atherosclerosis. Mild diffuse increased bowel gas without obstructive pattern. Possible bowel wall thickening at the hepatic flexure and descending colon. No radiopaque calculi. IMPRESSION: Diffuse increased bowel gas without obstructive pattern. There may be bowel wall edema at the hepatic flexure and descending colon as may be seen with colitis Electronically Signed   By: Donavan Foil M.D.   On: 12/30/2018 21:45   Dg Abd Portable 2v  Result Date: 01/01/2019 CLINICAL DATA:  Vomiting. EXAM: PORTABLE ABDOMEN - 2 VIEW COMPARISON:  Radiograph of December 30, 2018. FINDINGS: No small bowel dilatation is noted. Mildly dilated colon loop is seen in right side of abdomen which most likely represents ileus. Status post cholecystectomy. There is no evidence of free air. No radio-opaque calculi or other significant radiographic abnormality is seen. IMPRESSION: Mildly dilated colonic loops seen in right side of abdomen which may represent ileus. Electronically Signed   By: Marijo Conception, M.D.   On: 01/01/2019 14:50     LOS: 12 days   Signature  Lala Lund M.D on 01/10/2019 at 9:39 AM   -  To page go to [email or web address]

## 2019-01-10 NOTE — Progress Notes (Signed)
PALLIATIVE NOTE:  Patient awake, A&O in bed. Denied pain or discomfort. No shortness of breath. No family at the bedside. Patient expressed goals are for her to return home with family with hopes of improvement. She feels much better compared to previous days and states her family is excited of her improvement.   Patient continues to request to be a full code, with full aggressive measures. Family aware of patient's condition, however continues to be unrealistic and remain hopeful that she will continue to thrive despite co-morbidities and current illness. Their wishes are to continue with current treatment with hopes patient will be discharged soon. She has home health nursing assistants that come out daily to the home and assist with ADLs and household needs.   Physical Assessment: Thin, frail, chronically-ill appearing, regular rate and rhythm, decreased lung sounds, no edema, generalized weakness, bedbound  Plan:  Full Code Continue to treat Patient and family hopeful for return home with caregivers Community based Palliative for support and continue further goals of care conversations with patient and family   Total Time:  Greater than 50%  of this time was spent counseling and coordinating care related to the above assessment and plan  Willette Alma, AGPCNP-BC Palliative Medicine Team  Pager: [phone] Amion: Thea Alken

## 2019-01-10 NOTE — Progress Notes (Addendum)
Inpatient Diabetes Program Recommendations  AACE/ADA: New Consensus Statement on Inpatient Glycemic Control (2015)  Target Ranges:  Prepandial:   less than 140 mg/dL      Peak postprandial:   less than 180 mg/dL (1-2 hours)      Critically ill patients:  140 - 180 mg/dL   Lab Results  Component Value Date   GLUCAP 419 (H) 01/10/2019   HGBA1C 5.1 01/07/2019    Review of Glycemic Control Results for Amber [name], Amber [name] (MRN [id number]) as of 01/10/2019 13:15  Ref. Range 01/09/2019 23:24 01/10/2019 00:39 01/10/2019 04:24 01/10/2019 07:41 01/10/2019 11:40  Glucose-Capillary Latest Ref Range: 70 - 99 mg/dL 213 (HH) 086 (H) 578 (H) 251 (H) 419 (H)   Diabetes history: Type 2 DM Outpatient Diabetes medications: none Current orders for Inpatient glycemic control: Novolog 0-9 units TID Solumedrol 40 mg QD until 2/23, then Prednisone 5 mg QAM Inpatient Diabetes Program Recommendations:    Patient blood glucose elevated in the setting of steroids. Solumedrol just decreased from 60 mg Q12H to 40 mg QD.  Prior to steroids, CBGs were trending well.  While Solumedrol continues until 2/23, consider adding Novolog 3 units TID (assuming that patient is consuming >50% of meal, to be stopped once Solumedrol is completed) and add 2 dose of Levemir 8 units QHS (to be complete on 2/22).   Thanks, Lujean Rave, MSN, RNC-OB Diabetes Coordinator [phone] (8a-5p)

## 2019-01-10 NOTE — Progress Notes (Signed)
Physical Therapy Treatment Patient Details Name: Amber [name] MRN: [id number] DOB: 05-Sep-1956 Today's Date: 01/10/2019    History of Present Illness 63 y.o. female admitted 12/29/18 for hypotension with recent admission to [facility] for food poisoning and electrolyte abnormalities (d/c home on 12/28/18).  Pt came to ED not feeling well and found to be hyptensive.  PT dx with septic shock due to E coli gastroenteritis with bacteremia, lactic acidosis, hypokalemia, elevated troponin (suspected demand ischemia), mild AKI, and multiple episodes of hypoglycemia (h/o DM and poor po intake).  Pt with other significant PMH of HTN, embolic stroke, focal seizure, ACS, frequent falls and back surgery.     PT Comments    Pt agreed to engage in therex and transfer training with encouragement from family members. Pt able to perform bed mobility with greater ease compared to last treatment. Pt able to perform therex with abd/add only possible via PROM. Pt had stool incontinence during therex. Pt required max A +1 for sit to stand and max A +2 for stand pivot transfer to chair. Pt wrapped in warm blankets to avoid chill on her body. Phone/call bell, all other needs, chair alarm set, and family still present. Pt progress at current time makes discharge plan to SNF appropriate. Plan to continue with functional mobility.  Follow Up Recommendations  SNF     Equipment Recommendations  Hospital bed    Recommendations for Other Services       Precautions / Restrictions Precautions Precautions: Fall Restrictions Weight Bearing Restrictions: No    Mobility  Bed Mobility Overal bed mobility: Needs Assistance   Rolling: Mod assist Sidelying to sit: Mod assist       General bed mobility comments: Pt able to roll in bed with less assistance compared to previous visit. Able to follow commands to reach across her body. Assistance to mobilize hips. Once in sidelying required assistance to  advance LEs to EOB and elevate trunk into sitting.  Transfers Overall transfer level: Needs assistance Equipment used: Rolling walker (2 wheeled) Transfers: Sit to/from [employer] Sit to Stand: Max assist Stand pivot transfers: Max assist;+2 physical assistance       General transfer comment: Pt required max A +1 for sit to stand transfer and max A +2 for stand pivot transfer. Pt ignored cues to reach behind her and locate chair arms. Pt required max A +2 to avoid hard posterior lean when she could not locate chair arms.   Ambulation/Gait             General Gait Details: unable at this time   Stairs             Wheelchair Mobility    Modified Rankin (Stroke Patients Only)       Balance Overall balance assessment: Needs assistance Sitting-balance support: Bilateral upper extremity supported;Feet supported Sitting balance-Leahy Scale: Poor Sitting balance - Comments: Needs mod assist, but can be released for close supervision. Postural control: Right lateral lean;Posterior lean Standing balance support: Bilateral upper extremity supported Standing balance-Leahy Scale: Poor Standing balance comment: Pt requires max A+1 to remain standing for stand pivot transfers and to avoid falling from posterior lean.                            Cognition Arousal/Alertness: Lethargic Behavior During Therapy: Restless   Area of Impairment: Orientation;Attention;Memory;Following commands;Safety/judgement;Awareness;Problem solving  Orientation Level: Disoriented to;Time;Situation Current Attention Level: Selective Memory: Decreased recall of precautions;Decreased short-term memory Following Commands: Follows one step commands inconsistently;Follows one step commands with increased time Safety/Judgement: Decreased awareness of safety;Decreased awareness of deficits Awareness: Intellectual Problem Solving: Slow  processing;Decreased initiation;Difficulty sequencing;Requires verbal cues;Requires tactile cues        Exercises General Exercises - Lower Extremity Ankle Circles/Pumps: AROM;10 reps;Supine;Both Quad Sets: Both;Supine;AROM;10 reps Heel Slides: AROM;10 reps;Supine;Both Hip ABduction/ADduction: PROM;10 reps;Both;Supine    General Comments        Pertinent Vitals/Pain Faces Pain Scale: Hurts a little bit Pain Descriptors / Indicators: Aching;Discomfort;Moaning    Home Living                      Prior Function            PT Goals (current goals can now be found in the care plan section) Acute Rehab PT Goals Patient Stated Goal: to get better so she can get out of hospital PT Goal Formulation: With patient/family Potential to Achieve Goals: Fair    Frequency    Min 2X/week      PT Plan Current plan remains appropriate    Co-evaluation              AM-PAC PT "6 Clicks" Mobility   Outcome Measure  Help needed turning from your back to your side while in a flat bed without using bedrails?: A Lot Help needed moving from lying on your back to sitting on the side of a flat bed without using bedrails?: A Lot Help needed moving to and from a bed to a chair (including a wheelchair)?: Total Help needed standing up from a chair using your arms (e.g., wheelchair or bedside chair)?: Total Help needed to walk in hospital room?: Total Help needed climbing 3-5 steps with a railing? : Total 6 Click Score: 8    End of Session Equipment Utilized During Treatment: Gait belt Activity Tolerance: Patient tolerated treatment well Patient left: in chair;with call bell/phone within reach;with chair alarm set;with family/visitor present Nurse Communication: Mobility status PT Visit Diagnosis: Unsteadiness on feet (R26.81);Muscle weakness (generalized) (M62.81);Difficulty in walking, not elsewhere classified (R26.2);Pain     Time: [id number] PT Time Calculation (min)  (ACUTE ONLY): 25 min  Charges:  $Therapeutic Exercise: 8-22 mins $Therapeutic Activity: 8-22 mins                     Margarita MailStephen Slyvia Lartigue, SPTA   Margarita MailStephen Airabella Barley 01/10/2019, 3:55 PM

## 2019-01-11 ENCOUNTER — Inpatient Hospital Stay (HOSPITAL_COMMUNITY): Payer: Medicaid Other

## 2019-01-11 LAB — CBC
HCT: 24.6 % — ABNORMAL LOW (ref 36.0–46.0)
Hemoglobin: 8.2 g/dL — ABNORMAL LOW (ref 12.0–15.0)
MCH: 25.6 pg — ABNORMAL LOW (ref 26.0–34.0)
MCHC: 33.3 g/dL (ref 30.0–36.0)
MCV: 76.9 fL — ABNORMAL LOW (ref 80.0–100.0)
Platelets: 217 10*3/uL (ref 150–400)
RBC: 3.2 MIL/uL — ABNORMAL LOW (ref 3.87–5.11)
RDW: 19.9 % — ABNORMAL HIGH (ref 11.5–15.5)
WBC: 14.5 10*3/uL — ABNORMAL HIGH (ref 4.0–10.5)
nRBC: 0 % (ref 0.0–0.2)

## 2019-01-11 LAB — BASIC METABOLIC PANEL
Anion gap: 10 (ref 5–15)
BUN: 16 mg/dL (ref 6–20)
CALCIUM: 7.7 mg/dL — AB (ref 8.9–10.3)
CO2: 30 mmol/L (ref 22–32)
Chloride: 94 mmol/L — ABNORMAL LOW (ref 98–111)
Creatinine, Ser: 1.07 mg/dL — ABNORMAL HIGH (ref 0.44–1.00)
GFR calc Af Amer: >60 mL/min (ref >60)
GFR calc non Af Amer: 56 mL/min — ABNORMAL LOW (ref >60)
Glucose, Bld: 104 mg/dL — ABNORMAL HIGH (ref 70–99)
Potassium: 3.3 mmol/L — ABNORMAL LOW (ref 3.5–5.1)
SODIUM: 134 mmol/L — AB (ref 135–145)

## 2019-01-11 LAB — GLUCOSE, CAPILLARY
Glucose-Capillary: 107 mg/dL — ABNORMAL HIGH (ref 70–99)
Glucose-Capillary: 111 mg/dL — ABNORMAL HIGH (ref 70–99)
Glucose-Capillary: 116 mg/dL — ABNORMAL HIGH (ref 70–99)
Glucose-Capillary: 124 mg/dL — ABNORMAL HIGH (ref 70–99)
Glucose-Capillary: 180 mg/dL — ABNORMAL HIGH (ref 70–99)
Glucose-Capillary: 210 mg/dL — ABNORMAL HIGH (ref 70–99)
Glucose-Capillary: 317 mg/dL — ABNORMAL HIGH (ref 70–99)
Glucose-Capillary: 386 mg/dL — ABNORMAL HIGH (ref 70–99)

## 2019-01-11 LAB — MAGNESIUM: MAGNESIUM: 2.2 mg/dL (ref 1.7–2.4)

## 2019-01-11 MED ORDER — INSULIN ASPART 100 UNIT/ML ~~LOC~~ SOLN
0.0000 [IU] | Freq: Every day | SUBCUTANEOUS | Status: DC
  Administered 2019-01-11: 5 [IU] via SUBCUTANEOUS
  Administered 2019-01-12: 2 [IU] via SUBCUTANEOUS

## 2019-01-11 MED ORDER — POTASSIUM CHLORIDE 20 MEQ PO PACK
60.0000 meq | PACK | Freq: Once | ORAL | Status: AC
  Administered 2019-01-11: 60 meq via ORAL
  Filled 2019-01-11: qty 3

## 2019-01-11 MED ORDER — FUROSEMIDE 10 MG/ML IJ SOLN
60.0000 mg | Freq: Once | INTRAMUSCULAR | Status: AC
  Administered 2019-01-11: 60 mg via INTRAVENOUS
  Filled 2019-01-11: qty 6

## 2019-01-11 MED ORDER — INSULIN ASPART 100 UNIT/ML ~~LOC~~ SOLN
0.0000 [IU] | Freq: Three times a day (TID) | SUBCUTANEOUS | Status: DC
  Administered 2019-01-11: 3 [IU] via SUBCUTANEOUS
  Administered 2019-01-11: 7 [IU] via SUBCUTANEOUS
  Administered 2019-01-12 (×2): 5 [IU] via SUBCUTANEOUS
  Administered 2019-01-12: 7 [IU] via SUBCUTANEOUS
  Administered 2019-01-13: 2 [IU] via SUBCUTANEOUS
  Administered 2019-01-14: 1 [IU] via SUBCUTANEOUS
  Administered 2019-01-14: 3 [IU] via SUBCUTANEOUS
  Administered 2019-01-15 (×2): 1 [IU] via SUBCUTANEOUS

## 2019-01-11 MED ORDER — GLUCERNA SHAKE PO LIQD
237.0000 mL | Freq: Three times a day (TID) | ORAL | Status: DC
  Administered 2019-01-11 – 2019-01-16 (×15): 237 mL via ORAL

## 2019-01-11 NOTE — Progress Notes (Signed)
PROGRESS NOTE        PATIENT DETAILS Name: Amber [name] Age: 63 y.o. Sex: female Date of Birth: 1956/08/14 Admit Date: 12/29/2018 Admitting Physician Rush Farmer, MD PCP:Sun, Mikeal Hawthorne, MD  Brief Narrative:  Patient is a 63 y.o. female with history of chronic diastolic heart failure, PAF on anticoagulation, embolic CVA,  seizure disorder, rheumatoid arthritis, prior back back surgeries, chronic pain syndrome-presented to the ED for hypotension, was found to have septic shock secondary to gram-negative bacteremia.  See below for further details  Subjective: Patient in bed, appears comfortable, denies any headache, no fever, no chest pain or pressure, much improved shortness of breath , no abdominal pain. No focal weakness.  Assessment/Plan:  Septic shock secondary to E. coli bacteremia: Sepsis pathophysiology has resolved, was initially on Rocephin now Bactrim.  Continue total 10-day course, finished her antibiotics.  Cough-shortness of breath and orthopnea.  Combination of hospital-acquired influenza infection + dCHF.  Placed on Tamiflu, proBNP elevated, stopped IV fluids, is being diuresed with high-dose IV Lasix along with Zaroxolyn , symptoms only minimally improved, encouraged to sit up in chair, have added  Flutter valve added for pulmonary toiletry along with nebulizer treatments and oxygen for supportive care.  Despite multiple sessions of counseling patient is noncompliant about using flutter valve and sitting up in chair, re-counseled again on 01/09/2019.  All narcotics were stopped on 01/09/2019, this morning on 01/10/2019 she overall appears much improved, is taking much deeper breaths, also has diuresed well yesterday and today lung sounds much improved.  Encouraged to continue sitting up in chair and using flutter valve, continue to hold narcotics continue to gently diurese, now better.  Problem is her extreme frail status, being bedbound virtually 24/7, poor  cough flex along with chronic use of scheduled high-dose narcotics and poor ability to clear her pulmonary secretions.  Have been encouraging her daily to sit up in the chair and use flutter valve which so far she has not been doing see note above.  Her long-term prognosis appears very poor due to her underlying extremely frail status however patient and family continue to have unreasonable expectations.  Pulmonary has also provided second opinion and basically agree with our line of management. Updated family bedside extensively on 01/09/2019 and they understand the situation better now.   ESBL enterococcus UTI.  More likely colonization.  Currently afebrile without leukocytosis despite being on no antibiotics at this time.  She has been given 1 dose of oral fosfomycin on 01/08/2019, will continue to monitor clinically.    Acute on chronic diastolic CHF.  EF 65% on echocardiogram in 2019.  Repeat IV Lasix at higher dose with repeat dose of Zaroxolyn on 01/09/2019, monitor electrolytes, continue home dose beta-blocker, encouraged to sit up in chair, oxygen nebulizer treatments PRN.    Abdominal pain: Much improved-she had numerous BMs overnight after she was started on Dulcolax x1, senna/MiraLAX.  She has chronic abdominal pain at baseline from chronic pancreatitis.    Lactic acidosis: Secondary to sepsis, has normalized.  Elevated troponin: Trend is flat-not consistent with ACS-likely demand ischemia.  EKG without acute changes.  Given poor overall health-bedbound status-best served by conservative management.  PAF: Sinus rhythm-has prior history of embolic stroke-continue Eliquis.  Chronic thrombocytopenia: Unclear etiology-platelets close to usual baseline.  Follow.  Anemia: Suspect has chronic anemia at baseline-worsened by acute illness, has  required 1 unit of PRBC transfusion during this hospital stay.  Hemoglobin low but stable-follow.  History of rheumatoid arthritis with chronic steroid use:  Appears to be on chronic prednisone-currently on stress dose IV Solu-Cortef-we will slowly taper over the next few days.  Chronic pancreatitis: Continue Creon.  History of seizures: Continue Depakote.  History of CVA: Reviewed records in care everywhere-she has had prior embolic strokes-difficult neurologic exam due to lethargy/weakness-but appears nonfocal-on Eliquis  Generalized deconditioning/worsening debility: Per patient-she has not ambulated in 4 years-mostly bed to wheelchair bound.  She is going to talk to her family about SNF rehab-as per social work-her family wants to take her home.    Chronic pain syndrome (back pain, neck pain, abdominal pain): Had stool burden for which she has been placed on regimen, narcotics were cut in half initially and was stopped them on 01/09/2019 as she continues to have extremely poor cough reflex and appears somnolent most of the time.  Patient was given an option of stopping narcotics or declining further she agreed to stopping narcotics.  RN in room on 01/09/2019.  Hypokalemia and hypomagnesemia.  Both have been replaced, recheck again tomorrow as she is on ongoing Lasix IV.  Hyperglycemia.  Sugars extremely high likely due to IV steroids which have been transitioned back to oral, required insulin drip for 24 hours.  Will transition to sliding scale insulin but will check A1c.  Lab Results  Component Value Date   HGBA1C 5.1 01/07/2019   CBG (last 3)  Recent Labs    01/11/19 0413 01/11/19 0649 01/11/19 0745  GLUCAP 111* 116* 107*     DVT Prophylaxis: Full dose anticoagulation with Eliquis  Code Status: Full code  Family Communication: None at bedside  Disposition Plan: Remain inpatient-hopefully discharge tomorrow if clinical improvement continues.  Antimicrobial agents: Anti-infectives (From admission, onward)   Start     Dose/Rate Route Frequency Ordered Stop   01/08/19 1345  sulfamethoxazole-trimethoprim (BACTRIM DS,SEPTRA DS)  800-160 MG per tablet 1 tablet     1 tablet Oral Every 12 hours 01/08/19 1334 01/09/19 2313   01/05/19 1000  oseltamivir (TAMIFLU) capsule 30 mg     30 mg Oral 2 times daily 01/05/19 0826 01/09/19 2312   01/04/19 1500  cefTRIAXone (ROCEPHIN) 2 g in sodium chloride 0.9 % 100 mL IVPB  Status:  Discontinued     2 g 200 mL/hr over 30 Minutes Intravenous Every 24 hours 01/04/19 1416 01/07/19 1049   01/04/19 1500  metroNIDAZOLE (FLAGYL) IVPB 500 mg  Status:  Discontinued     500 mg 100 mL/hr over 60 Minutes Intravenous Every 8 hours 01/04/19 1416 01/07/19 1049   01/03/19 1030  sulfamethoxazole-trimethoprim (BACTRIM DS,SEPTRA DS) 800-160 MG per tablet 1 tablet  Status:  Discontinued     1 tablet Oral Every 12 hours 01/03/19 1019 01/04/19 1416   01/03/19 0000  sulfamethoxazole-trimethoprim (BACTRIM DS,SEPTRA DS) 800-160 MG tablet     1 tablet Oral Every 12 hours 01/03/19 1059     12/31/18 1330  vancomycin (VANCOCIN) IVPB 750 mg/150 ml premix  Status:  Discontinued     750 mg 150 mL/hr over 60 Minutes Intravenous Every 48 hours 12/29/18 1738 12/30/18 1127   12/30/18 1200  cefTRIAXone (ROCEPHIN) 2 g in sodium chloride 0.9 % 100 mL IVPB  Status:  Discontinued     2 g 200 mL/hr over 30 Minutes Intravenous Every 24 hours 12/30/18 1127 01/03/19 1019   12/30/18 0130  ceFEPIme (MAXIPIME) 1 g in sodium  chloride 0.9 % 100 mL IVPB  Status:  Discontinued     1 g 200 mL/hr over 30 Minutes Intravenous Every 12 hours 12/29/18 1750 12/30/18 1127   12/29/18 1715  ceFEPIme (MAXIPIME) 2 g in sodium chloride 0.9 % 100 mL IVPB  Status:  Discontinued     2 g 200 mL/hr over 30 Minutes Intravenous  Once 12/29/18 1706 12/29/18 1750   12/29/18 1715  metroNIDAZOLE (FLAGYL) IVPB 500 mg  Status:  Discontinued     500 mg 100 mL/hr over 60 Minutes Intravenous Every 8 hours 12/29/18 1706 12/30/18 1127   12/29/18 1715  vancomycin (VANCOCIN) IVPB 1000 mg/200 mL premix  Status:  Discontinued     1,000 mg 200 mL/hr over 60  Minutes Intravenous  Once 12/29/18 1706 12/29/18 1750   12/29/18 1230  ceFEPIme (MAXIPIME) 2 g in sodium chloride 0.9 % 100 mL IVPB     2 g 200 mL/hr over 30 Minutes Intravenous  Once 12/29/18 1226 12/29/18 1424   12/29/18 1230  metroNIDAZOLE (FLAGYL) IVPB 500 mg  Status:  Discontinued     500 mg 100 mL/hr over 60 Minutes Intravenous Every 8 hours 12/29/18 1226 12/29/18 1706   12/29/18 1230  vancomycin (VANCOCIN) IVPB 1000 mg/200 mL premix     1,000 mg 200 mL/hr over 60 Minutes Intravenous  Once 12/29/18 1226 12/29/18 1551      Procedures: None  CONSULTS: Palliative care, pulmonary  Time spent: 25- minutes-Greater than 50% of this time was spent in counseling, explanation of diagnosis, planning of further management, and coordination of care.  MEDICATIONS: Scheduled Meds: . allopurinol  300 mg Oral Daily  . apixaban  2.5 mg Oral BID  . feeding supplement (GLUCERNA SHAKE)  237 mL Oral TID BM  . furosemide  60 mg Intravenous Once  . insulin aspart  0-5 Units Subcutaneous QHS  . insulin aspart  0-9 Units Subcutaneous TID WC  . lipase/protease/amylase  36,000 Units Oral TID AC  . mouth rinse  15 mL Mouth Rinse BID  . metoprolol succinate  25 mg Oral Daily  . pantoprazole  40 mg Oral BID  . predniSONE  5 mg Oral Q breakfast  . spironolactone  25 mg Oral Daily  . valproic acid  250 mg Oral TID   Continuous Infusions: . sodium chloride 10 mL/hr at 01/10/19 2002   PRN Meds:.acetaminophen, dextrose, diltiazem, hydrOXYzine, ipratropium-albuterol, naLOXone (NARCAN)  injection, simethicone   PHYSICAL EXAM:  Vital signs: Vitals:   01/11/19 0052 01/11/19 0423 01/11/19 0758 01/11/19 0852  BP:  117/81 126/90 132/81  Pulse:  66  87  Resp:  (!) 28 (!) 22   Temp: 98.7 F (37.1 C)  97.9 F (36.6 C)   TempSrc: Oral  Oral   SpO2:  100% 97%   Weight:      Height:       Filed Weights   12/29/18 1229 12/29/18 2040  Weight: 40.5 kg 50.1 kg   Body mass index is 21.57 kg/m.    Exam  Awake Alert, frail African-American female who looks much older than her stated age, however overall looks better, few rales [place].AT,PERRAL Supple Neck,No JVD, No cervical lymphadenopathy appriciated.  Symmetrical Chest wall movement, Good air movement bilaterally,  RRR,No Gallops, Rubs or new Murmurs, No Parasternal Heave +ve B.Sounds, Abd Soft, No tenderness, No organomegaly appriciated, No rebound - guarding or rigidity. No Cyanosis, Clubbing or edema, No new Rash or bruise   I have personally reviewed following labs and imaging  studies  LABORATORY DATA: CBC: Recent Labs  Lab 01/05/19 0619 01/06/19 0432 01/08/19 0429 01/10/19 0741 01/11/19 0416  WBC 6.4 5.0 8.9 6.9 14.5*  HGB 7.7* 8.3* 8.5* 8.3* 8.2*  HCT 23.2* 23.2* 25.0* 24.5* 24.6*  MCV 79.5* 76.8* 76.9* 76.1* 76.9*  PLT 88* 73* 136* 187 462    Basic Metabolic Panel: Recent Labs  Lab 01/07/19 0434  01/08/19 0429 01/08/19 1710 01/09/19 0432 01/10/19 0741 01/11/19 0416  NA 139  --  138  --  134* 133* 134*  K 2.9*   < > 5.6* 4.9 4.5 3.3* 3.3*  CL 103  --  103  --  98 89* 94*  CO2 25  --  26  --  27 33* 30  GLUCOSE 506*  --  253*  --  68* 267* 104*  BUN 7  --  6  --  '7 12 16  '$ CREATININE 0.88  --  0.77  --  0.91 1.22* 1.07*  CALCIUM 7.4*  --  7.6*  --  7.9* 7.5* 7.7*  MG 1.7  --  1.6*  --  1.7 1.5* 2.2   < > = values in this interval not displayed.    GFR: Estimated Creatinine Clearance: 40.2 mL/min (A) (by C-G formula based on SCr of 1.07 mg/dL (H)).  Liver Function Tests: No results for input(s): AST, ALT, ALKPHOS, BILITOT, PROT, ALBUMIN in the last 168 hours. No results for input(s): LIPASE, AMYLASE in the last 168 hours. No results for input(s): AMMONIA in the last 168 hours.  Coagulation Profile: No results for input(s): INR, PROTIME in the last 168 hours.  Cardiac Enzymes: No results for input(s): CKTOTAL, CKMB, CKMBINDEX, TROPONINI in the last 168 hours.  BNP (last 3 results) No results  for input(s): PROBNP in the last 8760 hours.  HbA1C: No results for input(s): HGBA1C in the last 72 hours.  CBG: Recent Labs  Lab 01/11/19 0005 01/11/19 0208 01/11/19 0413 01/11/19 0649 01/11/19 0745  GLUCAP 180* 124* 111* 116* 107*    Lipid Profile: No results for input(s): CHOL, HDL, LDLCALC, TRIG, CHOLHDL, LDLDIRECT in the last 72 hours.  Thyroid Function Tests: No results for input(s): TSH, T4TOTAL, FREET4, T3FREE, THYROIDAB in the last 72 hours.  Anemia Panel: No results for input(s): VITAMINB12, FOLATE, FERRITIN, TIBC, IRON, RETICCTPCT in the last 72 hours.  Urine analysis:    Component Value Date/Time   COLORURINE YELLOW 01/04/2019 2048   APPEARANCEUR HAZY (A) 01/04/2019 2048   LABSPEC 1.015 01/04/2019 2048   PHURINE 7.0 01/04/2019 2048   GLUCOSEU NEGATIVE 01/04/2019 2048   HGBUR SMALL (A) 01/04/2019 2048   BILIRUBINUR NEGATIVE 01/04/2019 2048   KETONESUR NEGATIVE 01/04/2019 2048   PROTEINUR 30 (A) 01/04/2019 2048   UROBILINOGEN 1.0 07/27/2015 1052   NITRITE NEGATIVE 01/04/2019 2048   LEUKOCYTESUR NEGATIVE 01/04/2019 2048    Sepsis Labs: Lactic Acid, Venous    Component Value Date/Time   LATICACIDVEN 1.6 01/01/2019 0953    MICROBIOLOGY: Recent Results (from the past 240 hour(s))  Culture, blood (routine x 2)     Status: None   Collection Time: 01/04/19  6:38 PM  Result Value Ref Range Status   Specimen Description BLOOD LEFT HAND  Final   Special Requests   Final    BOTTLES DRAWN AEROBIC ONLY Blood Culture adequate volume   Culture   Final    NO GROWTH 5 DAYS Performed at [facility], [place] [address], [place], [place] [address]    Report Status  01/09/2019 FINAL  Final  Culture, blood (routine x 2)     Status: None   Collection Time: 01/04/19  6:38 PM  Result Value Ref Range Status   Specimen Description BLOOD RIGHT HAND  Final   Special Requests   Final    BOTTLES DRAWN AEROBIC ONLY Blood Culture results may not be optimal due to an  excessive volume of blood received in culture bottles   Culture   Final    NO GROWTH 5 DAYS Performed at [facility], [place] [address], [place], [place] [address]    Report Status 01/09/2019 FINAL  Final  Culture, Urine     Status: Abnormal   Collection Time: 01/04/19  8:48 PM  Result Value Ref Range Status   Specimen Description URINE, RANDOM  Final   Special Requests   Final    NONE Performed at [facility], [place] [address] [place], [place] [address]    Culture (A)  Final    >=100,000 COLONIES/mL VANCOMYCIN RESISTANT ENTEROCOCCUS ISOLATED   Report Status 01/07/2019 FINAL  Final   Organism ID, Bacteria VANCOMYCIN RESISTANT ENTEROCOCCUS ISOLATED (A)  Final      Susceptibility   Vancomycin resistant enterococcus isolated - MIC*    AMPICILLIN >=32 RESISTANT Resistant     LEVOFLOXACIN >=8 RESISTANT Resistant     NITROFURANTOIN 32 SENSITIVE Sensitive     VANCOMYCIN >=32 RESISTANT Resistant     LINEZOLID 2 SENSITIVE Sensitive     * >=100,000 COLONIES/mL VANCOMYCIN RESISTANT ENTEROCOCCUS ISOLATED    RADIOLOGY STUDIES/RESULTS: Ct Abdomen Pelvis Wo Contrast  Result Date: 12/29/2018 CLINICAL DATA:  Abdomen distension two days ago here for food poisoning Allergic to iv contrast no oral contrast per MD EXAM: CT ABDOMEN AND PELVIS WITHOUT CONTRAST TECHNIQUE: Multidetector CT imaging of the abdomen and pelvis was performed following the standard protocol without IV contrast. COMPARISON:  12/27/2018 FINDINGS: Lower chest: There is bibasilar atelectasis. Hepatobiliary: The liver is homogeneous. Status post cholecystectomy. Pancreas: Pancreas is atrophic. Coarse calcifications throughout the pancreatic body and tail suggests history of chronic pancreatitis. Spleen: Normal in size without focal abnormality. Adrenals/Urinary Tract: Adrenal glands are unremarkable. Kidneys are normal, without renal calculi, focal lesion, or hydronephrosis. Bladder is unremarkable. Stomach/Bowel: Stomach is  unremarkable. Small bowel loops are normal in appearance. There are numerous colonic diverticula. No acute diverticulitis. Significant stool within the rectosigmoid colon. There has been some improvement in the appearance of thickening of the rectosigmoid colon. No perirectal abscess. Vascular/Lymphatic: There is dense atherosclerotic calcification of the abdominal aorta not associated with aneurysm. No retroperitoneal or mesenteric adenopathy. Reproductive: The uterus is present. Multiple mural calcifications are present. No adnexal mass. Other: No ascites. No abdominal wall hernia. Note is made of fat within the LEFT inguinal ring, likely a lipoma. Diffuse body wall edema. Musculoskeletal: Remote wedge compression fractures of T12, L1, and L3. There is 2 millimeters anterolisthesis of L4 on L5. Degenerative changes are identified at L4-5 and L5-S1. No suspicious lytic or blastic lesions are identified. IMPRESSION: 1. Significant stool within the rectosigmoid colon. There has been some improvement in the appearance of thickening of the rectosigmoid colon. 2. Diverticulosis without acute diverticulitis. 3. Status post cholecystectomy. 4. Changes of chronic pancreatitis. 5. Remote wedge compression fractures of T12, L1, and L3. Aortic Atherosclerosis (ICD10-I70.0). Electronically Signed   By: Nolon Nations M.D.   On: 12/29/2018 13:12   Dg Chest Port 1 View  Result Date: 01/10/2019 CLINICAL DATA:  Short of breath.  Chest pain.  Weakness. EXAM: PORTABLE CHEST 1 VIEW COMPARISON:  01/08/2019 FINDINGS: Mild cardiomegaly. Low volumes. Diffuse airspace opacities throughout both lungs. Small pleural effusions. Spinal stabilization hardware is stable. No pneumothorax or pleural effusion. IMPRESSION: Stable diffuse bilateral airspace opacities. Electronically Signed   By: Marybelle Killings M.D.   On: 01/10/2019 08:14   Dg Chest Port 1 View  Result Date: 01/08/2019 CLINICAL DATA:  Shortness of breath, asthma,  hypertension, history of stroke, GERD, former smoker EXAM: PORTABLE CHEST 1 VIEW COMPARISON:  Portable exam 0650 hours compared to 01/06/2019 FINDINGS: Enlargement of cardiac silhouette. Stable mediastinal contours. Diffuse BILATERAL pulmonary infiltrates question pulmonary edema though infection is not excluded. No gross pleural effusion or pneumothorax. Bones demineralized with prior thoracic spinal fixation. BILATERAL glenohumeral degenerative changes. Old RIGHT rib fractures. Chronic lytic lesion at proximal RIGHT humerus. IMPRESSION: Enlargement of cardiac silhouette with peripheral cyst and diffuse pulmonary infiltrates favoring pulmonary edema though infection is not excluded. No interval change. Electronically Signed   By: Lavonia Dana M.D.   On: 01/08/2019 09:51   Dg Chest Port 1 View  Result Date: 01/06/2019 CLINICAL DATA:  Cough and congestion. EXAM: PORTABLE CHEST 1 VIEW COMPARISON:  January 05, 2019 FINDINGS: Stable cardiomegaly. Stable small pleural effusions. Increasing pulmonary opacities suggestive of worsening edema. Persistent lytic lesion in the proximal right humerus. IMPRESSION: 1. Cardiomegaly and worsening pulmonary edema.  Small effusions. 2. Persistent lytic lesion in the proximal right humerus. Gout was suggested on previous right shoulder x-rays. Electronically Signed   By: Dorise Bullion III M.D   On: 01/06/2019 09:09   Dg Chest Port 1 View  Result Date: 01/05/2019 CLINICAL DATA:  Five day history of shortness of breath. Acute mid chest pain that began today. EXAM: PORTABLE CHEST 1 VIEW COMPARISON:  01/04/2019 and earlier. FINDINGS: Cardiac silhouette moderately enlarged, unchanged. Increasing interstitial opacities superimposed upon the previously identified interstitial lung disease. Stable small BILATERAL pleural effusions. Prior thoracic spine corpectomy and fusion. Lytic lesion in the proximal RIGHT humerus. Severe degenerative changes involving both shoulders. IMPRESSION:  1. Developing interstitial pulmonary edema is suspected, superimposed upon the previously identified interstitial lung disease. 2. Stable small BILATERAL pleural effusions. 3. Lytic lesion involving the proximal RIGHT humerus. Does the patient have a known malignancy or multiple myeloma? Electronically Signed   By: Evangeline Dakin M.D.   On: 01/05/2019 08:58   Dg Chest Port 1 View  Result Date: 01/04/2019 CLINICAL DATA:  Difficulty breathing EXAM: PORTABLE CHEST 1 VIEW COMPARISON:  December 29, 2018; November 13, 2018 FINDINGS: Diffuse interstitial thickening remains stable. There is no frank edema consolidation. Heart is upper normal in size with pulmonary vascularity normal. No adenopathy. Extensive postoperative change in the thoracic spine noted. Evidence of old right-sided rib trauma laterally. There is arthropathy in the shoulders, stable. Cystic area in the proximal right humerus is stable. IMPRESSION: Diffuse interstitial thickening, likely reflecting chronic fibrosis. No frank edema or consolidation evident. Stable cardiac silhouette. Extensive postoperative change in the thoracic spine. Advanced arthropathy in the shoulders. Prior rib trauma on the right. Electronically Signed   By: Lowella Grip III M.D.   On: 01/04/2019 08:51   Dg Chest Port 1 View  Result Date: 12/29/2018 CLINICAL DATA:  63 year old female with altered mental status, fever, decreased p.o. intake. EXAM: PORTABLE CHEST 1 VIEW COMPARISON:  Portable chest 11/13/2018 and earlier. FINDINGS: Portable AP semi upright view at 1320 hours. Sequelae of previous thoracic spine corpectomy and posterior fusion. Hardware appears stable. Stable visualized osseous structures. Stable lung  volumes and mediastinal contours. Visualized tracheal air column is within normal limits. Chronic increased pulmonary interstitial markings appear stable since 2019. No pneumothorax, pleural effusion or acute pulmonary opacity. Mildly increased bowel gas in  the abdomen. Stable cholecystectomy clips. IMPRESSION: Stable.  No acute cardiopulmonary abnormality. Electronically Signed   By: Genevie Ann M.D.   On: 12/29/2018 14:03   Dg Abd Portable 1v  Result Date: 12/30/2018 CLINICAL DATA:  Abdominal pain EXAM: PORTABLE ABDOMEN - 1 VIEW COMPARISON:  CT 12/29/2018 FINDINGS: Surgical hardware in the lower thoracic spine. Dense aortic atherosclerosis. Mild diffuse increased bowel gas without obstructive pattern. Possible bowel wall thickening at the hepatic flexure and descending colon. No radiopaque calculi. IMPRESSION: Diffuse increased bowel gas without obstructive pattern. There may be bowel wall edema at the hepatic flexure and descending colon as may be seen with colitis Electronically Signed   By: Donavan Foil M.D.   On: 12/30/2018 21:45   Dg Abd Portable 2v  Result Date: 01/01/2019 CLINICAL DATA:  Vomiting. EXAM: PORTABLE ABDOMEN - 2 VIEW COMPARISON:  Radiograph of December 30, 2018. FINDINGS: No small bowel dilatation is noted. Mildly dilated colon loop is seen in right side of abdomen which most likely represents ileus. Status post cholecystectomy. There is no evidence of free air. No radio-opaque calculi or other significant radiographic abnormality is seen. IMPRESSION: Mildly dilated colonic loops seen in right side of abdomen which may represent ileus. Electronically Signed   By: Marijo Conception, M.D.   On: 01/01/2019 14:50     LOS: 13 days   Signature  Lala Lund M.D on 01/11/2019 at 9:26 AM   -  To page go to [email or web address]

## 2019-01-12 LAB — BASIC METABOLIC PANEL
Anion gap: 10 (ref 5–15)
BUN: 21 mg/dL — ABNORMAL HIGH (ref 6–20)
CO2: 26 mmol/L (ref 22–32)
Calcium: 8 mg/dL — ABNORMAL LOW (ref 8.9–10.3)
Chloride: 95 mmol/L — ABNORMAL LOW (ref 98–111)
Creatinine, Ser: 0.97 mg/dL (ref 0.44–1.00)
GFR calc Af Amer: >60 mL/min (ref >60)
Glucose, Bld: 313 mg/dL — ABNORMAL HIGH (ref 70–99)
Potassium: 5.2 mmol/L — ABNORMAL HIGH (ref 3.5–5.1)
Sodium: 131 mmol/L — ABNORMAL LOW (ref 135–145)

## 2019-01-12 LAB — GLUCOSE, CAPILLARY
Glucose-Capillary: 290 mg/dL — ABNORMAL HIGH (ref 70–99)
Glucose-Capillary: 357 mg/dL — ABNORMAL HIGH (ref 70–99)
Glucose-Capillary: 281 mg/dL — ABNORMAL HIGH (ref 70–99)
Glucose-Capillary: 271 mg/dL — ABNORMAL HIGH (ref 70–99)
Glucose-Capillary: 346 mg/dL — ABNORMAL HIGH (ref 70–99)
Glucose-Capillary: 212 mg/dL — ABNORMAL HIGH (ref 70–99)

## 2019-01-12 LAB — MAGNESIUM: Magnesium: 2 mg/dL (ref 1.7–2.4)

## 2019-01-12 MED ORDER — CARVEDILOL 3.125 MG PO TABS
3.1250 mg | ORAL_TABLET | Freq: Two times a day (BID) | ORAL | Status: DC
  Administered 2019-01-12: 3.125 mg via ORAL
  Filled 2019-01-12: qty 1

## 2019-01-12 MED ORDER — METOLAZONE 2.5 MG PO TABS
2.5000 mg | ORAL_TABLET | Freq: Once | ORAL | Status: AC
  Administered 2019-01-12: 2.5 mg via ORAL
  Filled 2019-01-12: qty 1

## 2019-01-12 MED ORDER — FUROSEMIDE 10 MG/ML IJ SOLN
60.0000 mg | Freq: Two times a day (BID) | INTRAMUSCULAR | Status: DC
  Administered 2019-01-12 – 2019-01-13 (×3): 60 mg via INTRAVENOUS
  Filled 2019-01-12 (×3): qty 6

## 2019-01-12 MED ORDER — INSULIN GLARGINE 100 UNIT/ML ~~LOC~~ SOLN
15.0000 [IU] | Freq: Every day | SUBCUTANEOUS | Status: DC
  Administered 2019-01-12 – 2019-01-15 (×4): 15 [IU] via SUBCUTANEOUS
  Filled 2019-01-12 (×4): qty 0.15

## 2019-01-12 MED ORDER — ISOSORBIDE MONONITRATE ER 30 MG PO TB24
30.0000 mg | ORAL_TABLET | Freq: Every day | ORAL | Status: DC
  Administered 2019-01-12: 30 mg via ORAL
  Filled 2019-01-12: qty 1

## 2019-01-12 NOTE — Progress Notes (Signed)
BP 77/58 pt asymptomatic Dr Thedore Mins notified will hold evening coreg and Lasix per MD

## 2019-01-12 NOTE — Progress Notes (Signed)
CBG 357, no coverage ordered. [place], NP notified

## 2019-01-12 NOTE — Progress Notes (Signed)
Pt frequently requesting, crackers and soft drinks. RN informed pt af her elevated CBG, effect of certain food and drink on results and importance of blood sugar control.  Teaching will need to be reinforced.

## 2019-01-12 NOTE — Progress Notes (Signed)
PROGRESS NOTE        PATIENT DETAILS Name: Tennille Montelongo Age: 63 y.o. Sex: female Date of Birth: 10-07-1956 Admit Date: 12/29/2018 Admitting Physician Rush Farmer, MD PCP:Sun, Mikeal Hawthorne, MD  Brief Narrative:  Patient is a 63 y.o. female with history of chronic diastolic heart failure, PAF on anticoagulation, embolic CVA,  seizure disorder, rheumatoid arthritis, prior back back surgeries, chronic pain syndrome-presented to the ED for hypotension, was found to have septic shock secondary to gram-negative bacteremia.  See below for further details  Subjective: Patient in bed, appears comfortable, denies any headache, no fever, no chest pain or pressure, no shortness of breath , no abdominal pain. No focal weakness.   Assessment/Plan:  Septic shock secondary to E. coli bacteremia: Sepsis pathophysiology has resolved, was initially on Rocephin now Bactrim.  Continue total 10-day course, finished her antibiotics.  Cough-shortness of breath and orthopnea.  Combination of hospital-acquired influenza infection + dCHF.  Placed on Tamiflu, proBNP elevated, stopped IV fluids, will continue high-dose IV Lasix along with oral Zaroxolyn , continue diuresis with gradual improvement in her symptoms, encouraged to sit up in chair, have added  Flutter valve added for pulmonary toiletry along with nebulizer treatments and oxygen for supportive care.  Also all narcotics were stopped on 01/09/2019, overall she is now taking much deeper breaths and with ongoing diuresis lung sounds much improved.  Encouraged to continue sitting up in chair and using flutter valve, continue to hold narcotics continue to gently diurese.  Problem is her extreme frail status, being bedbound virtually 24/7, poor cough flex along with chronic use of scheduled high-dose narcotics and poor ability to clear her pulmonary secretions.  Have been encouraging her daily to sit up in the chair and use flutter valve which so far  she has not been doing see note above.  Her long-term prognosis appears very poor due to her underlying extremely frail status however patient and family continue to have unreasonable expectations.  Pulmonary has also provided second opinion and basically agree with our line of management. Updated family bedside extensively on 01/09/2019 and they understand the situation better now.   ESBL enterococcus UTI.  More likely colonization.  Currently afebrile without leukocytosis despite being on no antibiotics at this time.  She has been given 1 dose of oral fosfomycin on 01/08/2019, will continue to monitor clinically.    Acute on chronic diastolic CHF.  EF 65% on echocardiogram in 2019.  Repeat IV Lasix at higher dose with repeat dose of Zaroxolyn on 01/09/2019, monitor electrolytes, continue home dose beta-blocker, encouraged to sit up in chair, oxygen nebulizer treatments PRN.    Abdominal pain: Much improved-she had numerous BMs overnight after she was started on Dulcolax x1, senna/MiraLAX.  She has chronic abdominal pain at baseline from chronic pancreatitis.    Lactic acidosis: Secondary to sepsis, has normalized.  Elevated troponin: Trend is flat-not consistent with ACS-likely demand ischemia.  EKG without acute changes.  Given poor overall health-bedbound status-best served by conservative management.  PAF: Sinus rhythm-has prior history of embolic stroke-continue Eliquis.  Chronic thrombocytopenia: Unclear etiology-platelets close to usual baseline.  Follow.  Anemia: Suspect has chronic anemia at baseline-worsened by acute illness, has required 1 unit of PRBC transfusion during this hospital stay.  Hemoglobin low but stable-follow.  History of rheumatoid arthritis with chronic steroid use: Appears to be on chronic  prednisone-currently on stress dose IV Solu-Cortef-we will slowly taper over the next few days.  Chronic pancreatitis: Continue Creon.  History of seizures: Continue  Depakote.  History of CVA: Reviewed records in care everywhere-she has had prior embolic strokes-difficult neurologic exam due to lethargy/weakness-but appears nonfocal-on Eliquis  Generalized deconditioning/worsening debility: Per patient-she has not ambulated in 4 years-mostly bed to wheelchair bound.  She is going to talk to her family about SNF rehab-as per social work-her family wants to take her home.    Chronic pain syndrome (back pain, neck pain, abdominal pain): Had stool burden for which she has been placed on regimen, narcotics were cut in half initially and was stopped them on 01/09/2019 as she continues to have extremely poor cough reflex and appears somnolent most of the time.  Patient was given an option of stopping narcotics or declining further she agreed to stopping narcotics.  RN in room on 01/09/2019.  Hypokalemia and hypomagnesemia.  Both have been replaced, recheck again tomorrow as she is on ongoing Lasix IV.  Hyperglycemia. ? Undiagnosed diabetes mellitus repeated A1c, on Lantus sliding scale, DM education and insulin education ordered to patient and family.  She is eating much better after narcotics were stopped hence I think her diabetes is being unmasked due to much improved oral intake.  Lab Results  Component Value Date   HGBA1C 5.1 01/07/2019   CBG (last 3)  Recent Labs    01/12/19 0135 01/12/19 0520 01/12/19 0838  GLUCAP 290* 357* 281*     DVT Prophylaxis: Full dose anticoagulation with Eliquis  Code Status: Full code  Family Communication: None at bedside  Disposition Plan: Remain inpatient-hopefully discharge tomorrow if clinical improvement continues.  Antimicrobial agents: Anti-infectives (From admission, onward)   Start     Dose/Rate Route Frequency Ordered Stop   01/08/19 1345  sulfamethoxazole-trimethoprim (BACTRIM DS,SEPTRA DS) 800-160 MG per tablet 1 tablet     1 tablet Oral Every 12 hours 01/08/19 1334 01/09/19 2313   01/05/19 1000   oseltamivir (TAMIFLU) capsule 30 mg     30 mg Oral 2 times daily 01/05/19 0826 01/09/19 2312   01/04/19 1500  cefTRIAXone (ROCEPHIN) 2 g in sodium chloride 0.9 % 100 mL IVPB  Status:  Discontinued     2 g 200 mL/hr over 30 Minutes Intravenous Every 24 hours 01/04/19 1416 01/07/19 1049   01/04/19 1500  metroNIDAZOLE (FLAGYL) IVPB 500 mg  Status:  Discontinued     500 mg 100 mL/hr over 60 Minutes Intravenous Every 8 hours 01/04/19 1416 01/07/19 1049   01/03/19 1030  sulfamethoxazole-trimethoprim (BACTRIM DS,SEPTRA DS) 800-160 MG per tablet 1 tablet  Status:  Discontinued     1 tablet Oral Every 12 hours 01/03/19 1019 01/04/19 1416   01/03/19 0000  sulfamethoxazole-trimethoprim (BACTRIM DS,SEPTRA DS) 800-160 MG tablet     1 tablet Oral Every 12 hours 01/03/19 1059     12/31/18 1330  vancomycin (VANCOCIN) IVPB 750 mg/150 ml premix  Status:  Discontinued     750 mg 150 mL/hr over 60 Minutes Intravenous Every 48 hours 12/29/18 1738 12/30/18 1127   12/30/18 1200  cefTRIAXone (ROCEPHIN) 2 g in sodium chloride 0.9 % 100 mL IVPB  Status:  Discontinued     2 g 200 mL/hr over 30 Minutes Intravenous Every 24 hours 12/30/18 1127 01/03/19 1019   12/30/18 0130  ceFEPIme (MAXIPIME) 1 g in sodium chloride 0.9 % 100 mL IVPB  Status:  Discontinued     1 g 200 mL/hr  over 30 Minutes Intravenous Every 12 hours 12/29/18 1750 12/30/18 1127   12/29/18 1715  ceFEPIme (MAXIPIME) 2 g in sodium chloride 0.9 % 100 mL IVPB  Status:  Discontinued     2 g 200 mL/hr over 30 Minutes Intravenous  Once 12/29/18 1706 12/29/18 1750   12/29/18 1715  metroNIDAZOLE (FLAGYL) IVPB 500 mg  Status:  Discontinued     500 mg 100 mL/hr over 60 Minutes Intravenous Every 8 hours 12/29/18 1706 12/30/18 1127   12/29/18 1715  vancomycin (VANCOCIN) IVPB 1000 mg/200 mL premix  Status:  Discontinued     1,000 mg 200 mL/hr over 60 Minutes Intravenous  Once 12/29/18 1706 12/29/18 1750   12/29/18 1230  ceFEPIme (MAXIPIME) 2 g in sodium chloride  0.9 % 100 mL IVPB     2 g 200 mL/hr over 30 Minutes Intravenous  Once 12/29/18 1226 12/29/18 1424   12/29/18 1230  metroNIDAZOLE (FLAGYL) IVPB 500 mg  Status:  Discontinued     500 mg 100 mL/hr over 60 Minutes Intravenous Every 8 hours 12/29/18 1226 12/29/18 1706   12/29/18 1230  vancomycin (VANCOCIN) IVPB 1000 mg/200 mL premix     1,000 mg 200 mL/hr over 60 Minutes Intravenous  Once 12/29/18 1226 12/29/18 1551      Procedures: None  CONSULTS: Palliative care, pulmonary  Time spent: 25- minutes-Greater than 50% of this time was spent in counseling, explanation of diagnosis, planning of further management, and coordination of care.  MEDICATIONS: Scheduled Meds: . allopurinol  300 mg Oral Daily  . apixaban  2.5 mg Oral BID  . carvedilol  3.125 mg Oral BID WC  . feeding supplement (GLUCERNA SHAKE)  237 mL Oral TID BM  . furosemide  60 mg Intravenous BID  . insulin aspart  0-5 Units Subcutaneous QHS  . insulin aspart  0-9 Units Subcutaneous TID WC  . insulin glargine  15 Units Subcutaneous Daily  . isosorbide mononitrate  30 mg Oral Daily  . lipase/protease/amylase  36,000 Units Oral TID AC  . mouth rinse  15 mL Mouth Rinse BID  . metolazone  2.5 mg Oral Once  . pantoprazole  40 mg Oral BID  . predniSONE  5 mg Oral Q breakfast  . valproic acid  250 mg Oral TID   Continuous Infusions: . sodium chloride 10 mL/hr at 01/10/19 2002   PRN Meds:.acetaminophen, dextrose, diltiazem, hydrOXYzine, ipratropium-albuterol, naLOXone (NARCAN)  injection, simethicone   PHYSICAL EXAM:  Vital signs: Vitals:   01/12/19 0016 01/12/19 0018 01/12/19 0415 01/12/19 0911  BP: 110/77  103/79 (!) 145/89  Pulse: 87 90 67 89  Resp: (!) 37 (!) 30 (!) 25   Temp:   98.4 F (36.9 C)   TempSrc:   Oral   SpO2: 97% 100% 100%   Weight:      Height:       Filed Weights   12/29/18 1229 12/29/18 2040  Weight: 40.5 kg 50.1 kg   Body mass index is 21.57 kg/m.   Exam  Awake Alert, frail  African-American female who looks much older than her stated age, however overall looks better, few rales [place].AT,PERRAL Supple Neck,No JVD, No cervical lymphadenopathy appriciated.  Symmetrical Chest wall movement, Good air movement bilaterally,   RRR,No Gallops, Rubs or new Murmurs, No Parasternal Heave +ve B.Sounds, Abd Soft, No tenderness, No organomegaly appriciated, No rebound - guarding or rigidity. No Cyanosis, Clubbing or edema, No new Rash or bruise  I have personally reviewed following labs and imaging studies  LABORATORY DATA: CBC: Recent Labs  Lab 01/06/19 0432 01/08/19 0429 01/10/19 0741 01/11/19 0416  WBC 5.0 8.9 6.9 14.5*  HGB 8.3* 8.5* 8.3* 8.2*  HCT 23.2* 25.0* 24.5* 24.6*  MCV 76.8* 76.9* 76.1* 76.9*  PLT 73* 136* 187 165    Basic Metabolic Panel: Recent Labs  Lab 01/08/19 0429 01/08/19 1710 01/09/19 0432 01/10/19 0741 01/11/19 0416 01/12/19 0547  NA 138  --  134* 133* 134* 131*  K 5.6* 4.9 4.5 3.3* 3.3* 5.2*  CL 103  --  98 89* 94* 95*  CO2 26  --  27 33* 30 26  GLUCOSE 253*  --  68* 267* 104* 313*  BUN 6  --  '7 12 16 '$ 21*  CREATININE 0.77  --  0.91 1.22* 1.07* 0.97  CALCIUM 7.6*  --  7.9* 7.5* 7.7* 8.0*  MG 1.6*  --  1.7 1.5* 2.2 2.0    GFR: Estimated Creatinine Clearance: 44.3 mL/min (by C-G formula based on SCr of 0.97 mg/dL).  Liver Function Tests: No results for input(s): AST, ALT, ALKPHOS, BILITOT, PROT, ALBUMIN in the last 168 hours. No results for input(s): LIPASE, AMYLASE in the last 168 hours. No results for input(s): AMMONIA in the last 168 hours.  Coagulation Profile: No results for input(s): INR, PROTIME in the last 168 hours.  Cardiac Enzymes: No results for input(s): CKTOTAL, CKMB, CKMBINDEX, TROPONINI in the last 168 hours.  BNP (last 3 results) No results for input(s): PROBNP in the last 8760 hours.  HbA1C: No results for input(s): HGBA1C in the last 72 hours.  CBG: Recent Labs  Lab 01/11/19 1622 01/11/19 2042  01/12/19 0135 01/12/19 0520 01/12/19 0838  GLUCAP 317* 386* 290* 357* 281*    Lipid Profile: No results for input(s): CHOL, HDL, LDLCALC, TRIG, CHOLHDL, LDLDIRECT in the last 72 hours.  Thyroid Function Tests: No results for input(s): TSH, T4TOTAL, FREET4, T3FREE, THYROIDAB in the last 72 hours.  Anemia Panel: No results for input(s): VITAMINB12, FOLATE, FERRITIN, TIBC, IRON, RETICCTPCT in the last 72 hours.  Urine analysis:    Component Value Date/Time   COLORURINE YELLOW 01/04/2019 2048   APPEARANCEUR HAZY (A) 01/04/2019 2048   LABSPEC 1.015 01/04/2019 2048   PHURINE 7.0 01/04/2019 2048   GLUCOSEU NEGATIVE 01/04/2019 2048   HGBUR SMALL (A) 01/04/2019 2048   BILIRUBINUR NEGATIVE 01/04/2019 2048   KETONESUR NEGATIVE 01/04/2019 2048   PROTEINUR 30 (A) 01/04/2019 2048   UROBILINOGEN 1.0 07/27/2015 1052   NITRITE NEGATIVE 01/04/2019 2048   LEUKOCYTESUR NEGATIVE 01/04/2019 2048    Sepsis Labs: Lactic Acid, Venous    Component Value Date/Time   LATICACIDVEN 1.6 01/01/2019 0953    MICROBIOLOGY: Recent Results (from the past 240 hour(s))  Culture, blood (routine x 2)     Status: None   Collection Time: 01/04/19  6:38 PM  Result Value Ref Range Status   Specimen Description BLOOD LEFT HAND  Final   Special Requests   Final    BOTTLES DRAWN AEROBIC ONLY Blood Culture adequate volume   Culture   Final    NO GROWTH 5 DAYS Performed at [facility], [place] [address] [place], [place] [address]    Report Status 01/09/2019 FINAL  Final  Culture, blood (routine x 2)     Status: None   Collection Time: 01/04/19  6:38 PM  Result Value Ref Range Status   Specimen Description BLOOD RIGHT HAND  Final   Special Requests   Final    BOTTLES DRAWN  AEROBIC ONLY Blood Culture results may not be optimal due to an excessive volume of blood received in culture bottles   Culture   Final    NO GROWTH 5 DAYS Performed at [facility] [address] [place], [place]  [address]    Report Status 01/09/2019 FINAL  Final  Culture, Urine     Status: Abnormal   Collection Time: 01/04/19  8:48 PM  Result Value Ref Range Status   Specimen Description URINE, RANDOM  Final   Special Requests   Final    NONE Performed at [facility], [place] [address] [place], [place] [address]    Culture (A)  Final    >=100,000 COLONIES/mL VANCOMYCIN RESISTANT ENTEROCOCCUS ISOLATED   Report Status 01/07/2019 FINAL  Final   Organism ID, Bacteria VANCOMYCIN RESISTANT ENTEROCOCCUS ISOLATED (A)  Final      Susceptibility   Vancomycin resistant enterococcus isolated - MIC*    AMPICILLIN >=32 RESISTANT Resistant     LEVOFLOXACIN >=8 RESISTANT Resistant     NITROFURANTOIN 32 SENSITIVE Sensitive     VANCOMYCIN >=32 RESISTANT Resistant     LINEZOLID 2 SENSITIVE Sensitive     * >=100,000 COLONIES/mL VANCOMYCIN RESISTANT ENTEROCOCCUS ISOLATED    RADIOLOGY STUDIES/RESULTS: Ct Abdomen Pelvis Wo Contrast  Result Date: 12/29/2018 CLINICAL DATA:  Abdomen distension two days ago here for food poisoning Allergic to iv contrast no oral contrast per MD EXAM: CT ABDOMEN AND PELVIS WITHOUT CONTRAST TECHNIQUE: Multidetector CT imaging of the abdomen and pelvis was performed following the standard protocol without IV contrast. COMPARISON:  12/27/2018 FINDINGS: Lower chest: There is bibasilar atelectasis. Hepatobiliary: The liver is homogeneous. Status post cholecystectomy. Pancreas: Pancreas is atrophic. Coarse calcifications throughout the pancreatic body and tail suggests history of chronic pancreatitis. Spleen: Normal in size without focal abnormality. Adrenals/Urinary Tract: Adrenal glands are unremarkable. Kidneys are normal, without renal calculi, focal lesion, or hydronephrosis. Bladder is unremarkable. Stomach/Bowel: Stomach is unremarkable. Small bowel loops are normal in appearance. There are numerous colonic diverticula. No acute diverticulitis. Significant stool within the rectosigmoid  colon. There has been some improvement in the appearance of thickening of the rectosigmoid colon. No perirectal abscess. Vascular/Lymphatic: There is dense atherosclerotic calcification of the abdominal aorta not associated with aneurysm. No retroperitoneal or mesenteric adenopathy. Reproductive: The uterus is present. Multiple mural calcifications are present. No adnexal mass. Other: No ascites. No abdominal wall hernia. Note is made of fat within the LEFT inguinal ring, likely a lipoma. Diffuse body wall edema. Musculoskeletal: Remote wedge compression fractures of T12, L1, and L3. There is 2 millimeters anterolisthesis of L4 on L5. Degenerative changes are identified at L4-5 and L5-S1. No suspicious lytic or blastic lesions are identified. IMPRESSION: 1. Significant stool within the rectosigmoid colon. There has been some improvement in the appearance of thickening of the rectosigmoid colon. 2. Diverticulosis without acute diverticulitis. 3. Status post cholecystectomy. 4. Changes of chronic pancreatitis. 5. Remote wedge compression fractures of T12, L1, and L3. Aortic Atherosclerosis (ICD10-I70.0). Electronically Signed   By: Nolon Nations M.D.   On: 12/29/2018 13:12   Dg Chest 2 View  Result Date: 01/11/2019 CLINICAL DATA:  Shortness of breath. EXAM: CHEST - 2 VIEW COMPARISON:  Chest x-ray from yesterday. FINDINGS: Stable cardiomegaly. Improving interstitial edema superimposed on chronic interstitial changes. No focal consolidation, pleural effusion, or pneumothorax. Unchanged thoracic spine hardware. IMPRESSION: 1. Improving pulmonary edema. Electronically Signed   By: Titus Dubin M.D.   On: 01/11/2019 10:49   Dg  Chest Port 1 View  Result Date: 01/10/2019 CLINICAL DATA:  Short of breath.  Chest pain.  Weakness. EXAM: PORTABLE CHEST 1 VIEW COMPARISON:  01/08/2019 FINDINGS: Mild cardiomegaly. Low volumes. Diffuse airspace opacities throughout both lungs. Small pleural effusions. Spinal  stabilization hardware is stable. No pneumothorax or pleural effusion. IMPRESSION: Stable diffuse bilateral airspace opacities. Electronically Signed   By: Marybelle Killings M.D.   On: 01/10/2019 08:14   Dg Chest Port 1 View  Result Date: 01/08/2019 CLINICAL DATA:  Shortness of breath, asthma, hypertension, history of stroke, GERD, former smoker EXAM: PORTABLE CHEST 1 VIEW COMPARISON:  Portable exam 0650 hours compared to 01/06/2019 FINDINGS: Enlargement of cardiac silhouette. Stable mediastinal contours. Diffuse BILATERAL pulmonary infiltrates question pulmonary edema though infection is not excluded. No gross pleural effusion or pneumothorax. Bones demineralized with prior thoracic spinal fixation. BILATERAL glenohumeral degenerative changes. Old RIGHT rib fractures. Chronic lytic lesion at proximal RIGHT humerus. IMPRESSION: Enlargement of cardiac silhouette with peripheral cyst and diffuse pulmonary infiltrates favoring pulmonary edema though infection is not excluded. No interval change. Electronically Signed   By: Lavonia Dana M.D.   On: 01/08/2019 09:51   Dg Chest Port 1 View  Result Date: 01/06/2019 CLINICAL DATA:  Cough and congestion. EXAM: PORTABLE CHEST 1 VIEW COMPARISON:  January 05, 2019 FINDINGS: Stable cardiomegaly. Stable small pleural effusions. Increasing pulmonary opacities suggestive of worsening edema. Persistent lytic lesion in the proximal right humerus. IMPRESSION: 1. Cardiomegaly and worsening pulmonary edema.  Small effusions. 2. Persistent lytic lesion in the proximal right humerus. Gout was suggested on previous right shoulder x-rays. Electronically Signed   By: Dorise Bullion III M.D   On: 01/06/2019 09:09   Dg Chest Port 1 View  Result Date: 01/05/2019 CLINICAL DATA:  Five day history of shortness of breath. Acute mid chest pain that began today. EXAM: PORTABLE CHEST 1 VIEW COMPARISON:  01/04/2019 and earlier. FINDINGS: Cardiac silhouette moderately enlarged, unchanged.  Increasing interstitial opacities superimposed upon the previously identified interstitial lung disease. Stable small BILATERAL pleural effusions. Prior thoracic spine corpectomy and fusion. Lytic lesion in the proximal RIGHT humerus. Severe degenerative changes involving both shoulders. IMPRESSION: 1. Developing interstitial pulmonary edema is suspected, superimposed upon the previously identified interstitial lung disease. 2. Stable small BILATERAL pleural effusions. 3. Lytic lesion involving the proximal RIGHT humerus. Does the patient have a known malignancy or multiple myeloma? Electronically Signed   By: Evangeline Dakin M.D.   On: 01/05/2019 08:58   Dg Chest Port 1 View  Result Date: 01/04/2019 CLINICAL DATA:  Difficulty breathing EXAM: PORTABLE CHEST 1 VIEW COMPARISON:  December 29, 2018; November 13, 2018 FINDINGS: Diffuse interstitial thickening remains stable. There is no frank edema consolidation. Heart is upper normal in size with pulmonary vascularity normal. No adenopathy. Extensive postoperative change in the thoracic spine noted. Evidence of old right-sided rib trauma laterally. There is arthropathy in the shoulders, stable. Cystic area in the proximal right humerus is stable. IMPRESSION: Diffuse interstitial thickening, likely reflecting chronic fibrosis. No frank edema or consolidation evident. Stable cardiac silhouette. Extensive postoperative change in the thoracic spine. Advanced arthropathy in the shoulders. Prior rib trauma on the right. Electronically Signed   By: Lowella Grip III M.D.   On: 01/04/2019 08:51   Dg Chest Port 1 View  Result Date: 12/29/2018 CLINICAL DATA:  63 year old female with altered mental status, fever, decreased p.o. intake. EXAM: PORTABLE CHEST 1 VIEW COMPARISON:  Portable chest 11/13/2018 and earlier. FINDINGS: Portable AP semi upright view at 1320 hours.  Sequelae of previous thoracic spine corpectomy and posterior fusion. Hardware appears stable. Stable  visualized osseous structures. Stable lung volumes and mediastinal contours. Visualized tracheal air column is within normal limits. Chronic increased pulmonary interstitial markings appear stable since 2019. No pneumothorax, pleural effusion or acute pulmonary opacity. Mildly increased bowel gas in the abdomen. Stable cholecystectomy clips. IMPRESSION: Stable.  No acute cardiopulmonary abnormality. Electronically Signed   By: Genevie Ann M.D.   On: 12/29/2018 14:03   Dg Abd Portable 1v  Result Date: 12/30/2018 CLINICAL DATA:  Abdominal pain EXAM: PORTABLE ABDOMEN - 1 VIEW COMPARISON:  CT 12/29/2018 FINDINGS: Surgical hardware in the lower thoracic spine. Dense aortic atherosclerosis. Mild diffuse increased bowel gas without obstructive pattern. Possible bowel wall thickening at the hepatic flexure and descending colon. No radiopaque calculi. IMPRESSION: Diffuse increased bowel gas without obstructive pattern. There may be bowel wall edema at the hepatic flexure and descending colon as may be seen with colitis Electronically Signed   By: Donavan Foil M.D.   On: 12/30/2018 21:45   Dg Abd Portable 2v  Result Date: 01/01/2019 CLINICAL DATA:  Vomiting. EXAM: PORTABLE ABDOMEN - 2 VIEW COMPARISON:  Radiograph of December 30, 2018. FINDINGS: No small bowel dilatation is noted. Mildly dilated colon loop is seen in right side of abdomen which most likely represents ileus. Status post cholecystectomy. There is no evidence of free air. No radio-opaque calculi or other significant radiographic abnormality is seen. IMPRESSION: Mildly dilated colonic loops seen in right side of abdomen which may represent ileus. Electronically Signed   By: Marijo Conception, M.D.   On: 01/01/2019 14:50     LOS: 14 days   Signature  Lala Lund M.D on 01/12/2019 at 11:24 AM   -  To page go to [email or web address]

## 2019-01-12 NOTE — Progress Notes (Signed)
Pt very anxious, c/o back pain, HR and RR elevated. Tylenol and Vistaril given, pt repositioned. Will continue to monitor pt.

## 2019-01-13 LAB — POTASSIUM: Potassium: 5.7 mmol/L — ABNORMAL HIGH (ref 3.5–5.1)

## 2019-01-13 LAB — GLUCOSE, CAPILLARY
GLUCOSE-CAPILLARY: 112 mg/dL — AB (ref 70–99)
Glucose-Capillary: 184 mg/dL — ABNORMAL HIGH (ref 70–99)
Glucose-Capillary: 197 mg/dL — ABNORMAL HIGH (ref 70–99)
Glucose-Capillary: 167 mg/dL — ABNORMAL HIGH (ref 70–99)
Glucose-Capillary: 190 mg/dL — ABNORMAL HIGH (ref 70–99)
Glucose-Capillary: 206 mg/dL — ABNORMAL HIGH (ref 70–99)

## 2019-01-13 LAB — HEMOGLOBIN A1C
Hgb A1c MFr Bld: 5.4 % (ref 4.8–5.6)
Mean Plasma Glucose: 108 mg/dL

## 2019-01-13 MED ORDER — METOLAZONE 2.5 MG PO TABS
2.5000 mg | ORAL_TABLET | Freq: Once | ORAL | Status: AC
  Administered 2019-01-13: 2.5 mg via ORAL
  Filled 2019-01-13: qty 1

## 2019-01-13 MED ORDER — KETOROLAC TROMETHAMINE 30 MG/ML IJ SOLN
30.0000 mg | Freq: Once | INTRAMUSCULAR | Status: AC
  Administered 2019-01-13: 30 mg via INTRAVENOUS
  Filled 2019-01-13: qty 1

## 2019-01-13 MED ORDER — NEBIVOLOL HCL 2.5 MG PO TABS
2.5000 mg | ORAL_TABLET | Freq: Every day | ORAL | Status: DC
  Administered 2019-01-14 – 2019-01-16 (×3): 2.5 mg via ORAL
  Filled 2019-01-13 (×3): qty 1

## 2019-01-13 MED ORDER — SODIUM POLYSTYRENE SULFONATE 15 GM/60ML PO SUSP
30.0000 g | Freq: Once | ORAL | Status: AC
  Administered 2019-01-13: 30 g via ORAL
  Filled 2019-01-13: qty 120

## 2019-01-13 NOTE — Progress Notes (Signed)
PROGRESS NOTE        PATIENT DETAILS Name: Wafa Martes Age: 63 y.o. Sex: female Date of Birth: 1956/01/25 Admit Date: 12/29/2018 Admitting Physician Rush Farmer, MD PCP:Sun, Mikeal Hawthorne, MD  Brief Narrative:  Patient is a 63 y.o. female with history of chronic diastolic heart failure, PAF on anticoagulation, embolic CVA,  seizure disorder, rheumatoid arthritis, prior back back surgeries, chronic pain syndrome-presented to the ED for hypotension, was found to have septic shock secondary to gram-negative bacteremia.  See below for further details  Subjective: Patient in bed, appears comfortable, denies any headache, no fever, no chest pain or pressure, no shortness of breath , no abdominal pain. No focal weakness.  Assessment/Plan:  Septic shock secondary to E. coli bacteremia: Sepsis pathophysiology has resolved, has finished her antibiotic total 10-day course.  Cough-shortness of breath and orthopnea.  Combination of hospital-acquired influenza infection + dCHF.  She is much better after aggressive diuresis with IV Lasix and Zaroxolyn, finished her course of Tamiflu, reduction of narcotics to improve respiratory effort and cough reflex, all these measures have shown good improvement.  Continue to encourage her sitting up in chair and using flutter valve for pulmonary toiletry.  Her baseline problem is her extreme frail status, being bedbound virtually 24/7, poor cough flex along with chronic use of scheduled high-dose narcotics and poor ability to clear her pulmonary secretions.  Pulmonary has also provided second opinion and basically agree with our line of management. Updated family bedside extensively on 01/09/2019 and they understand the situation better now.   ESBL enterococcus UTI.  More likely colonization.  Currently afebrile without leukocytosis despite being on no antibiotics at this time.  She has been given 1 dose of oral fosfomycin on 01/08/2019, will continue  to monitor clinically.    Acute on chronic diastolic CHF.  EF 65% on echocardiogram in 2019.  Repeat IV Lasix at higher dose with repeat dose of Zaroxolyn on 01/09/2019, monitor electrolytes, placed her on low-dose Bystolic if tolerated by blood pressure, encouraged to sit up in chair, oxygen nebulizer treatments PRN.    Abdominal pain: Much improved-she had numerous BMs overnight after she was started on Dulcolax x1, senna/MiraLAX.  She has chronic abdominal pain at baseline from chronic pancreatitis.    Lactic acidosis: Secondary to sepsis, has normalized.  Elevated troponin: Trend is flat-not consistent with ACS-likely demand ischemia.  EKG without acute changes.  Given poor overall health-bedbound status-best served by conservative management.  PAF: Sinus rhythm-has prior history of embolic stroke-continue Eliquis.  Chronic thrombocytopenia: Unclear etiology-platelets close to usual baseline.  Follow.  Anemia: Suspect has chronic anemia at baseline-worsened by acute illness, has required 1 unit of PRBC transfusion during this hospital stay.  Hemoglobin low but stable-follow.  History of rheumatoid arthritis with chronic steroid use: Appears to be on chronic prednisone-currently on stress dose IV Solu-Cortef-we will slowly taper over the next few days.  Chronic pancreatitis: Continue Creon.  History of seizures: Continue Depakote.  History of CVA: Reviewed records in care everywhere-she has had prior embolic strokes-difficult neurologic exam due to lethargy/weakness-but appears nonfocal-on Eliquis  Generalized deconditioning/worsening debility: Per patient-she has not ambulated in 4 years-mostly bed to wheelchair bound.  She is going to talk to her family about SNF rehab-as per social work-her family wants to take her home.    Chronic pain syndrome (back pain, neck pain,  abdominal pain): Had stool burden for which she has been placed on regimen, narcotics were cut in half initially and  was stopped them on 01/09/2019 as she continues to have extremely poor cough reflex and appears somnolent most of the time.  Patient was given an option of stopping narcotics or declining further she agreed to stopping narcotics.  RN in room on 01/09/2019.  Hyperkalemia.  Continue diuresis with IV Lasix and Zaroxolyn, low-dose Kayexalate x1.  Repeat BMP in the morning.  Hyperglycemia. ? Undiagnosed diabetes mellitus repeat A1c, sugars were extremely high and now somewhat stable on Lantus + sliding scale, DM education and insulin education ordered to patient and family.  She is eating much better after narcotics were stopped hence I think her diabetes is being unmasked due to much improved oral intake.  Lab Results  Component Value Date   HGBA1C 5.1 01/07/2019   CBG (last 3)  Recent Labs    01/13/19 0107 01/13/19 0414 01/13/19 0746  GLUCAP 184* 197* 167*     DVT Prophylaxis: Full dose anticoagulation with Eliquis  Code Status: Full code  Family Communication: None at bedside  Disposition Plan: Remain inpatient-hopefully discharge tomorrow if clinical improvement continues.  Antimicrobial agents: Anti-infectives (From admission, onward)   Start     Dose/Rate Route Frequency Ordered Stop   01/08/19 1345  sulfamethoxazole-trimethoprim (BACTRIM DS,SEPTRA DS) 800-160 MG per tablet 1 tablet     1 tablet Oral Every 12 hours 01/08/19 1334 01/09/19 2313   01/05/19 1000  oseltamivir (TAMIFLU) capsule 30 mg     30 mg Oral 2 times daily 01/05/19 0826 01/09/19 2312   01/04/19 1500  cefTRIAXone (ROCEPHIN) 2 g in sodium chloride 0.9 % 100 mL IVPB  Status:  Discontinued     2 g 200 mL/hr over 30 Minutes Intravenous Every 24 hours 01/04/19 1416 01/07/19 1049   01/04/19 1500  metroNIDAZOLE (FLAGYL) IVPB 500 mg  Status:  Discontinued     500 mg 100 mL/hr over 60 Minutes Intravenous Every 8 hours 01/04/19 1416 01/07/19 1049   01/03/19 1030  sulfamethoxazole-trimethoprim (BACTRIM DS,SEPTRA DS)  800-160 MG per tablet 1 tablet  Status:  Discontinued     1 tablet Oral Every 12 hours 01/03/19 1019 01/04/19 1416   01/03/19 0000  sulfamethoxazole-trimethoprim (BACTRIM DS,SEPTRA DS) 800-160 MG tablet     1 tablet Oral Every 12 hours 01/03/19 1059     12/31/18 1330  vancomycin (VANCOCIN) IVPB 750 mg/150 ml premix  Status:  Discontinued     750 mg 150 mL/hr over 60 Minutes Intravenous Every 48 hours 12/29/18 1738 12/30/18 1127   12/30/18 1200  cefTRIAXone (ROCEPHIN) 2 g in sodium chloride 0.9 % 100 mL IVPB  Status:  Discontinued     2 g 200 mL/hr over 30 Minutes Intravenous Every 24 hours 12/30/18 1127 01/03/19 1019   12/30/18 0130  ceFEPIme (MAXIPIME) 1 g in sodium chloride 0.9 % 100 mL IVPB  Status:  Discontinued     1 g 200 mL/hr over 30 Minutes Intravenous Every 12 hours 12/29/18 1750 12/30/18 1127   12/29/18 1715  ceFEPIme (MAXIPIME) 2 g in sodium chloride 0.9 % 100 mL IVPB  Status:  Discontinued     2 g 200 mL/hr over 30 Minutes Intravenous  Once 12/29/18 1706 12/29/18 1750   12/29/18 1715  metroNIDAZOLE (FLAGYL) IVPB 500 mg  Status:  Discontinued     500 mg 100 mL/hr over 60 Minutes Intravenous Every 8 hours 12/29/18 1706 12/30/18 1127  12/29/18 1715  vancomycin (VANCOCIN) IVPB 1000 mg/200 mL premix  Status:  Discontinued     1,000 mg 200 mL/hr over 60 Minutes Intravenous  Once 12/29/18 1706 12/29/18 1750   12/29/18 1230  ceFEPIme (MAXIPIME) 2 g in sodium chloride 0.9 % 100 mL IVPB     2 g 200 mL/hr over 30 Minutes Intravenous  Once 12/29/18 1226 12/29/18 1424   12/29/18 1230  metroNIDAZOLE (FLAGYL) IVPB 500 mg  Status:  Discontinued     500 mg 100 mL/hr over 60 Minutes Intravenous Every 8 hours 12/29/18 1226 12/29/18 1706   12/29/18 1230  vancomycin (VANCOCIN) IVPB 1000 mg/200 mL premix     1,000 mg 200 mL/hr over 60 Minutes Intravenous  Once 12/29/18 1226 12/29/18 1551      Procedures: None  CONSULTS: Palliative care, pulmonary  Time spent: 25- minutes-Greater than  50% of this time was spent in counseling, explanation of diagnosis, planning of further management, and coordination of care.  MEDICATIONS: Scheduled Meds: . allopurinol  300 mg Oral Daily  . apixaban  2.5 mg Oral BID  . feeding supplement (GLUCERNA SHAKE)  237 mL Oral TID BM  . furosemide  60 mg Intravenous BID  . insulin aspart  0-5 Units Subcutaneous QHS  . insulin aspart  0-9 Units Subcutaneous TID WC  . insulin glargine  15 Units Subcutaneous Daily  . lipase/protease/amylase  36,000 Units Oral TID AC  . mouth rinse  15 mL Mouth Rinse BID  . metolazone  2.5 mg Oral Once  . [START ON 01/14/2019] nebivolol  2.5 mg Oral Daily  . pantoprazole  40 mg Oral BID  . predniSONE  5 mg Oral Q breakfast  . sodium polystyrene  30 g Oral Once  . valproic acid  250 mg Oral TID   Continuous Infusions:  PRN Meds:.acetaminophen, dextrose, diltiazem, hydrOXYzine, ipratropium-albuterol, naLOXone (NARCAN)  injection, simethicone   PHYSICAL EXAM:  Vital signs: Vitals:   01/12/19 2100 01/12/19 2233 01/13/19 0406 01/13/19 0654  BP:    93/72  Pulse: (!) 102 89  89  Resp: (!) 32 (!) 24  (!) 26  Temp:   98 F (36.7 C) 98.2 F (36.8 C)  TempSrc:    Oral  SpO2: 100% 100%  100%  Weight:      Height:       Filed Weights   12/29/18 1229 12/29/18 2040  Weight: 40.5 kg 50.1 kg   Body mass index is 21.57 kg/m.   Exam  Awake Alert, frail African-American female who looks much older than her stated age, however overall looks better, few rales Jansen.AT,PERRAL Supple Neck,No JVD, No cervical lymphadenopathy appriciated.  Symmetrical Chest wall movement, Good air movement bilaterally,   RRR,No Gallops, Rubs or new Murmurs, No Parasternal Heave +ve B.Sounds, Abd Soft, No tenderness, No organomegaly appriciated, No rebound - guarding or rigidity. No Cyanosis, Clubbing or edema, No new Rash or bruise   I have personally reviewed following labs and imaging studies  LABORATORY DATA: CBC: Recent Labs   Lab 01/08/19 0429 01/10/19 0741 01/11/19 0416  WBC 8.9 6.9 14.5*  HGB 8.5* 8.3* 8.2*  HCT 25.0* 24.5* 24.6*  MCV 76.9* 76.1* 76.9*  PLT 136* 187 564    Basic Metabolic Panel: Recent Labs  Lab 01/08/19 0429  01/09/19 0432 01/10/19 0741 01/11/19 0416 01/12/19 0547 01/13/19 0554  NA 138  --  134* 133* 134* 131*  --   K 5.6*   < > 4.5 3.3* 3.3* 5.2* 5.7*  CL 103  --  98 89* 94* 95*  --   CO2 26  --  27 33* 30 26  --   GLUCOSE 253*  --  68* 267* 104* 313*  --   BUN 6  --  _0 21*  --   CREATININE 0.77  --  0.91 1.22* 1.07* 0.97  --   CALCIUM 7.6*  --  7.9* 7.5* 7.7* 8.0*  --   MG 1.6*  --  1.7 1.5* 2.2 2.0  --    < > = values in this interval not displayed.    GFR: Estimated Creatinine Clearance: 44.3 mL/min (by C-G formula based on SCr of 0.97 mg/dL).  Liver Function Tests: No results for input(s): AST, ALT, ALKPHOS, BILITOT, PROT, ALBUMIN in the last 168 hours. No results for input(s): LIPASE, AMYLASE in the last 168 hours. No results for input(s): AMMONIA in the last 168 hours.  Coagulation Profile: No results for input(s): INR, PROTIME in the last 168 hours.  Cardiac Enzymes: No results for input(s): CKTOTAL, CKMB, CKMBINDEX, TROPONINI in the last 168 hours.  BNP (last 3 results) No results for input(s): PROBNP in the last 8760 hours.  HbA1C: No results for input(s): HGBA1C in the last 72 hours.  CBG: Recent Labs  Lab 01/12/19 1736 01/12/19 2101 01/13/19 0107 01/13/19 0414 01/13/19 0746  GLUCAP 346* 212* 184* 197* 167*    Lipid Profile: No results for input(s): CHOL, HDL, LDLCALC, TRIG, CHOLHDL, LDLDIRECT in the last 72 hours.  Thyroid Function Tests: No results for input(s): TSH, T4TOTAL, FREET4, T3FREE, THYROIDAB in the last 72 hours.  Anemia Panel: No results for input(s): VITAMINB12, FOLATE, FERRITIN, TIBC, IRON, RETICCTPCT in the last 72 hours.  Urine analysis:    Component Value Date/Time   COLORURINE YELLOW 01/04/2019 2048    APPEARANCEUR HAZY (A) 01/04/2019 2048   LABSPEC 1.015 01/04/2019 2048   PHURINE 7.0 01/04/2019 2048   GLUCOSEU NEGATIVE 01/04/2019 2048   HGBUR SMALL (A) 01/04/2019 2048   BILIRUBINUR NEGATIVE 01/04/2019 2048   KETONESUR NEGATIVE 01/04/2019 2048   PROTEINUR 30 (A) 01/04/2019 2048   UROBILINOGEN 1.0 07/27/2015 1052   NITRITE NEGATIVE 01/04/2019 2048   LEUKOCYTESUR NEGATIVE 01/04/2019 2048    Sepsis Labs: Lactic Acid, Venous    Component Value Date/Time   LATICACIDVEN 1.6 01/01/2019 0953    MICROBIOLOGY: Recent Results (from the past 240 hour(s))  Culture, blood (routine x 2)     Status: None   Collection Time: 01/04/19  6:38 PM  Result Value Ref Range Status   Specimen Description BLOOD LEFT HAND  Final   Special Requests   Final    BOTTLES DRAWN AEROBIC ONLY Blood Culture adequate volume   Culture   Final    NO GROWTH 5 DAYS Performed at [facility], [place] [address], [place], [place] [address]    Report Status 01/09/2019 FINAL  Final  Culture, blood (routine x 2)     Status: None   Collection Time: 01/04/19  6:38 PM  Result Value Ref Range Status   Specimen Description BLOOD RIGHT HAND  Final   Special Requests   Final    BOTTLES DRAWN AEROBIC ONLY Blood Culture results may not be optimal due to an excessive volume of blood received in culture bottles   Culture   Final    NO GROWTH 5 DAYS Performed at [facility], [place] [address], [place], [place] [address]    Report Status 01/09/2019 FINAL  Final  Culture, Urine     Status: Abnormal   Collection Time: 01/04/19  8:48 PM  Result Value Ref Range Status   Specimen Description URINE, RANDOM  Final   Special Requests   Final    NONE Performed at [facility], [place] [address] [place], [place] [address]    Culture (A)  Final    >=100,000 COLONIES/mL VANCOMYCIN RESISTANT ENTEROCOCCUS ISOLATED   Report Status 01/07/2019 FINAL  Final   Organism ID, Bacteria VANCOMYCIN RESISTANT ENTEROCOCCUS ISOLATED (A)   Final      Susceptibility   Vancomycin resistant enterococcus isolated - MIC*    AMPICILLIN >=32 RESISTANT Resistant     LEVOFLOXACIN >=8 RESISTANT Resistant     NITROFURANTOIN 32 SENSITIVE Sensitive     VANCOMYCIN >=32 RESISTANT Resistant     LINEZOLID 2 SENSITIVE Sensitive     * >=100,000 COLONIES/mL VANCOMYCIN RESISTANT ENTEROCOCCUS ISOLATED    RADIOLOGY STUDIES/RESULTS: Ct Abdomen Pelvis Wo Contrast  Result Date: 12/29/2018 CLINICAL DATA:  Abdomen distension two days ago here for food poisoning Allergic to iv contrast no oral contrast per MD EXAM: CT ABDOMEN AND PELVIS WITHOUT CONTRAST TECHNIQUE: Multidetector CT imaging of the abdomen and pelvis was performed following the standard protocol without IV contrast. COMPARISON:  12/27/2018 FINDINGS: Lower chest: There is bibasilar atelectasis. Hepatobiliary: The liver is homogeneous. Status post cholecystectomy. Pancreas: Pancreas is atrophic. Coarse calcifications throughout the pancreatic body and tail suggests history of chronic pancreatitis. Spleen: Normal in size without focal abnormality. Adrenals/Urinary Tract: Adrenal glands are unremarkable. Kidneys are normal, without renal calculi, focal lesion, or hydronephrosis. Bladder is unremarkable. Stomach/Bowel: Stomach is unremarkable. Small bowel loops are normal in appearance. There are numerous colonic diverticula. No acute diverticulitis. Significant stool within the rectosigmoid colon. There has been some improvement in the appearance of thickening of the rectosigmoid colon. No perirectal abscess. Vascular/Lymphatic: There is dense atherosclerotic calcification of the abdominal aorta not associated with aneurysm. No retroperitoneal or mesenteric adenopathy. Reproductive: The uterus is present. Multiple mural calcifications are present. No adnexal mass. Other: No ascites. No abdominal wall hernia. Note is made of fat within the LEFT inguinal ring, likely a lipoma. Diffuse body wall edema.  Musculoskeletal: Remote wedge compression fractures of T12, L1, and L3. There is 2 millimeters anterolisthesis of L4 on L5. Degenerative changes are identified at L4-5 and L5-S1. No suspicious lytic or blastic lesions are identified. IMPRESSION: 1. Significant stool within the rectosigmoid colon. There has been some improvement in the appearance of thickening of the rectosigmoid colon. 2. Diverticulosis without acute diverticulitis. 3. Status post cholecystectomy. 4. Changes of chronic pancreatitis. 5. Remote wedge compression fractures of T12, L1, and L3. Aortic Atherosclerosis (ICD10-I70.0). Electronically Signed   By: Nolon Nations M.D.   On: 12/29/2018 13:12   Dg Chest 2 View  Result Date: 01/11/2019 CLINICAL DATA:  Shortness of breath. EXAM: CHEST - 2 VIEW COMPARISON:  Chest x-ray from yesterday. FINDINGS: Stable cardiomegaly. Improving interstitial edema superimposed on chronic interstitial changes. No focal consolidation, pleural effusion, or pneumothorax. Unchanged thoracic spine hardware. IMPRESSION: 1. Improving pulmonary edema. Electronically Signed   By: Titus Dubin M.D.   On: 01/11/2019 10:49   Dg Chest Port 1 View  Result Date: 01/10/2019 CLINICAL DATA:  Short of breath.  Chest pain.  Weakness. EXAM: PORTABLE CHEST 1 VIEW COMPARISON:  01/08/2019 FINDINGS: Mild cardiomegaly. Low volumes. Diffuse airspace opacities throughout both lungs. Small pleural effusions. Spinal stabilization hardware is stable. No pneumothorax or pleural effusion. IMPRESSION: Stable diffuse  bilateral airspace opacities. Electronically Signed   By: Marybelle Killings M.D.   On: 01/10/2019 08:14   Dg Chest Port 1 View  Result Date: 01/08/2019 CLINICAL DATA:  Shortness of breath, asthma, hypertension, history of stroke, GERD, former smoker EXAM: PORTABLE CHEST 1 VIEW COMPARISON:  Portable exam 0650 hours compared to 01/06/2019 FINDINGS: Enlargement of cardiac silhouette. Stable mediastinal contours. Diffuse BILATERAL  pulmonary infiltrates question pulmonary edema though infection is not excluded. No gross pleural effusion or pneumothorax. Bones demineralized with prior thoracic spinal fixation. BILATERAL glenohumeral degenerative changes. Old RIGHT rib fractures. Chronic lytic lesion at proximal RIGHT humerus. IMPRESSION: Enlargement of cardiac silhouette with peripheral cyst and diffuse pulmonary infiltrates favoring pulmonary edema though infection is not excluded. No interval change. Electronically Signed   By: Lavonia Dana M.D.   On: 01/08/2019 09:51   Dg Chest Port 1 View  Result Date: 01/06/2019 CLINICAL DATA:  Cough and congestion. EXAM: PORTABLE CHEST 1 VIEW COMPARISON:  January 05, 2019 FINDINGS: Stable cardiomegaly. Stable small pleural effusions. Increasing pulmonary opacities suggestive of worsening edema. Persistent lytic lesion in the proximal right humerus. IMPRESSION: 1. Cardiomegaly and worsening pulmonary edema.  Small effusions. 2. Persistent lytic lesion in the proximal right humerus. Gout was suggested on previous right shoulder x-rays. Electronically Signed   By: Dorise Bullion III M.D   On: 01/06/2019 09:09   Dg Chest Port 1 View  Result Date: 01/05/2019 CLINICAL DATA:  Five day history of shortness of breath. Acute mid chest pain that began today. EXAM: PORTABLE CHEST 1 VIEW COMPARISON:  01/04/2019 and earlier. FINDINGS: Cardiac silhouette moderately enlarged, unchanged. Increasing interstitial opacities superimposed upon the previously identified interstitial lung disease. Stable small BILATERAL pleural effusions. Prior thoracic spine corpectomy and fusion. Lytic lesion in the proximal RIGHT humerus. Severe degenerative changes involving both shoulders. IMPRESSION: 1. Developing interstitial pulmonary edema is suspected, superimposed upon the previously identified interstitial lung disease. 2. Stable small BILATERAL pleural effusions. 3. Lytic lesion involving the proximal RIGHT humerus. Does  the patient have a known malignancy or multiple myeloma? Electronically Signed   By: Evangeline Dakin M.D.   On: 01/05/2019 08:58   Dg Chest Port 1 View  Result Date: 01/04/2019 CLINICAL DATA:  Difficulty breathing EXAM: PORTABLE CHEST 1 VIEW COMPARISON:  December 29, 2018; November 13, 2018 FINDINGS: Diffuse interstitial thickening remains stable. There is no frank edema consolidation. Heart is upper normal in size with pulmonary vascularity normal. No adenopathy. Extensive postoperative change in the thoracic spine noted. Evidence of old right-sided rib trauma laterally. There is arthropathy in the shoulders, stable. Cystic area in the proximal right humerus is stable. IMPRESSION: Diffuse interstitial thickening, likely reflecting chronic fibrosis. No frank edema or consolidation evident. Stable cardiac silhouette. Extensive postoperative change in the thoracic spine. Advanced arthropathy in the shoulders. Prior rib trauma on the right. Electronically Signed   By: Lowella Grip III M.D.   On: 01/04/2019 08:51   Dg Chest Port 1 View  Result Date: 12/29/2018 CLINICAL DATA:  63 year old female with altered mental status, fever, decreased p.o. intake. EXAM: PORTABLE CHEST 1 VIEW COMPARISON:  Portable chest 11/13/2018 and earlier. FINDINGS: Portable AP semi upright view at 1320 hours. Sequelae of previous thoracic spine corpectomy and posterior fusion. Hardware appears stable. Stable visualized osseous structures. Stable lung volumes and mediastinal contours. Visualized tracheal air column is within normal limits. Chronic increased pulmonary interstitial markings appear stable since 2019. No pneumothorax, pleural effusion or acute pulmonary opacity. Mildly increased bowel gas in the abdomen.  Stable cholecystectomy clips. IMPRESSION: Stable.  No acute cardiopulmonary abnormality. Electronically Signed   By: Genevie Ann M.D.   On: 12/29/2018 14:03   Dg Abd Portable 1v  Result Date: 12/30/2018 CLINICAL DATA:   Abdominal pain EXAM: PORTABLE ABDOMEN - 1 VIEW COMPARISON:  CT 12/29/2018 FINDINGS: Surgical hardware in the lower thoracic spine. Dense aortic atherosclerosis. Mild diffuse increased bowel gas without obstructive pattern. Possible bowel wall thickening at the hepatic flexure and descending colon. No radiopaque calculi. IMPRESSION: Diffuse increased bowel gas without obstructive pattern. There may be bowel wall edema at the hepatic flexure and descending colon as may be seen with colitis Electronically Signed   By: Donavan Foil M.D.   On: 12/30/2018 21:45   Dg Abd Portable 2v  Result Date: 01/01/2019 CLINICAL DATA:  Vomiting. EXAM: PORTABLE ABDOMEN - 2 VIEW COMPARISON:  Radiograph of December 30, 2018. FINDINGS: No small bowel dilatation is noted. Mildly dilated colon loop is seen in right side of abdomen which most likely represents ileus. Status post cholecystectomy. There is no evidence of free air. No radio-opaque calculi or other significant radiographic abnormality is seen. IMPRESSION: Mildly dilated colonic loops seen in right side of abdomen which may represent ileus. Electronically Signed   By: Marijo Conception, M.D.   On: 01/01/2019 14:50     LOS: 15 days   Signature  Lala Lund M.D on 01/13/2019 at 8:36 AM   -  To page go to [email or web address]

## 2019-01-13 NOTE — Progress Notes (Signed)
Inpatient Diabetes Program Recommendations  AACE/ADA: New Consensus Statement on Inpatient Glycemic Control (2015)  Target Ranges:  Prepandial:   less than 140 mg/dL      Peak postprandial:   less than 180 mg/dL (1-2 hours)      Critically ill patients:  140 - 180 mg/dL   Lab Results  Component Value Date   GLUCAP 112 (H) 01/13/2019   HGBA1C 5.4 01/11/2019    Review of Glycemic Control Results for SUMAYO, FOGLIA (MRN [id number]) as of 01/13/2019 13:20  Ref. Range 01/13/2019 01:07 01/13/2019 04:14 01/13/2019 07:46 01/13/2019 11:44  Glucose-Capillary Latest Ref Range: 70 - 99 mg/dL 761 (H) 518 (H) 343 (H) 112 (H)  On admission prior to steroids: Results for DEJANAI, SCHRIVER (MRN [id number]) as of 01/13/2019 13:20  Ref. Range 01/09/2019 05:09 01/09/2019 05:31 01/09/2019 05:48 01/09/2019 08:39 01/09/2019 12:22  Glucose-Capillary Latest Ref Range: 70 - 99 mg/dL 68 (L) 60 (L) 86 784 (H) 189 (H)   Diabetes history: Type 2 DM Outpatient Diabetes medications: none Current orders for Inpatient glycemic control: Novolog 0-9 units TID   Prednisone 5 mg QAM  Inpatient Diabetes Program Recommendations:    Noted consult for new diagnosis of DM. Documented A1C of 6.6% in 2018, indicating diabetes diagnosis per ADA.  Of note, patient had A1C results of 5.1% and 5.4% during hospitalization and in both cases, results were with hemoglobin of 8.1 and 8.4 respectively. I question the accuracy of these results. Also, patient had glucoses within target range of 60-80's ml/dL prior to steroid administration and was on steroids for atleast 3-4 days prior to having hyperglycemia. This makes dosing recommendations for insulin at discharge difficult, especially given AM trends.  Per note from 11/09/2018, "Type 2 DM -with history of hypoglycemia in the past.HbA1c 5.7%.In the past, I had stopped her metformin because of persistent hypoglycemia. I still see it in her home list of medicine but not sure if she was actively  taking it. She has a strong history of noncompliance in the past. I will not resume metformin at discharge. In her last admission, patient had persistent drop in blood glucose level to as low as 11 without symptoms."   This AM glucose is trending well, however yesterday had post prandial elevations, especially with Glucerna TID. Will continue to watch given current orders.   Addendum @1530 : Attempted to speak with patient. Upon going into to room, patient is visibly having increased work of breathing. Stating, "I cannot breathe, please help me." RN called. Permission granted to touch base with daughter, as she lives with her. Patient is not appropriate for education at this time. Will attempt to reach out to daughter prior to discharge.  Thanks, Lujean Rave, MSN, RNC-OB Diabetes Coordinator [phone] (8a-5p)

## 2019-01-13 NOTE — Progress Notes (Signed)
Nutrition Follow-up  INTERVENTION:  - Obtain new weight  - Continue Glucerna Shake po TID, each supplement provides 220 kcal and 10 grams of protein  - Feeding assistance at meal times  - Fruit TID with meals  NUTRITION DIAGNOSIS:   Inadequate oral intake related to altered GI function as evidenced by (inadequate diet).  Progressing  GOAL:   Patient will meet greater than or equal to 90% of their needs  Progressing  MONITOR:   Supplement acceptance, Diet advancement  REASON FOR ASSESSMENT:   Malnutrition Screening Tool    ASSESSMENT:   Pt with an PMH of pancreatitis, DM, chronic pain, CVA, SBO 11/2017, GERD, and constipation who was recently hospitalized at [facility] for food poisoning with multiple electrolyte abnormalities who was d/c now admitted with septic shock.    Spoke with pt at bedside. Pt requesting NT help due to soiling herself.  Noted pt is drinking milk at bedside. Pt states that she "loves" the vanilla flavored Glucerna Shakes. Noted these are ordered TID. Will continue with this order. Previously pt has stated that she only likes [employer]. Will continue to monitor for acceptance.  Pt states that she did "okay" at breakfast this morning and had oatmeal and fruit. Pt requesting chicken and dumplings. Discussed change in menu and that chicken and dumplings are no longer offered. Pt requesting fruit with meals. RD to order.  Pt states that her appetite remains poor but that she is eating because she needs to. Pt denies any issues with current diet texture.  No new weight since 12/29/18. Please obtain new weight.  Meal Completion: 20-100% x 8 meals (average 56%)  Medications reviewed and include: Glucerna TID, Lasix, SSI, Lantus 15 units daily, Creon 36,000 units TID, Protonix, Prednisone  Labs reviewed: potassium 5.7 (H), sodium 131 (L), BUN 21 (H), hemoglobin 8.2 (L), hemoglobin A1C 5.4 (WNL) CBG's: 167, 197, 184, 212, 346 x 24 hours  UOP: 770 ml  x 24 hours I/O's: -5.9 L since admit  Diet Order:   Diet Order            Diet Carb Modified Fluid consistency: Thin; Room service appropriate? Yes with Assist  Diet effective now        Diet - low sodium heart healthy              EDUCATION NEEDS:   No education needs have been identified at this time  Skin:  Skin Assessment: Skin Integrity Issues: Stage I: sacrum  Last BM:  2/23 medium type 5   Height:   Ht Readings from Last 1 Encounters:  12/29/18 5' (1.524 m)    Weight:   Wt Readings from Last 1 Encounters:  12/29/18 50.1 kg    Ideal Body Weight:  45.4 kg  BMI:  Body mass index is 21.57 kg/m.  Estimated Nutritional Needs:   Kcal:  1500-1700 kcals   Protein:  75-85 g  Fluid:  >1.5 L/day    Earma Reading, MS, RD, LDN Inpatient Clinical Dietitian Pager: [phone] Weekend/After Hours: [phone]

## 2019-01-13 NOTE — Progress Notes (Signed)
Pt c/o chronic back pain 8/10, Tylenol given at 0302, no relief per pt. [place], NP notified.

## 2019-01-14 ENCOUNTER — Inpatient Hospital Stay (HOSPITAL_COMMUNITY): Payer: Medicaid Other

## 2019-01-14 DIAGNOSIS — R0609 Other forms of dyspnea: Secondary | ICD-10-CM

## 2019-01-14 DIAGNOSIS — Z515 Encounter for palliative care: Secondary | ICD-10-CM

## 2019-01-14 DIAGNOSIS — J810 Acute pulmonary edema: Secondary | ICD-10-CM

## 2019-01-14 DIAGNOSIS — R7989 Other specified abnormal findings of blood chemistry: Secondary | ICD-10-CM

## 2019-01-14 DIAGNOSIS — I5031 Acute diastolic (congestive) heart failure: Secondary | ICD-10-CM

## 2019-01-14 DIAGNOSIS — Z7189 Other specified counseling: Secondary | ICD-10-CM

## 2019-01-14 LAB — BASIC METABOLIC PANEL WITH GFR
Anion gap: 9 (ref 5–15)
BUN: 36 mg/dL — ABNORMAL HIGH (ref 6–20)
CO2: 26 mmol/L (ref 22–32)
Calcium: 8.1 mg/dL — ABNORMAL LOW (ref 8.9–10.3)
Chloride: 101 mmol/L (ref 98–111)
Creatinine, Ser: 0.87 mg/dL (ref 0.44–1.00)
GFR calc Af Amer: >60 mL/min
GFR calc non Af Amer: >60 mL/min
Glucose, Bld: 106 mg/dL — ABNORMAL HIGH (ref 70–99)
Potassium: 4.8 mmol/L (ref 3.5–5.1)
Sodium: 136 mmol/L (ref 135–145)

## 2019-01-14 LAB — ECHOCARDIOGRAM COMPLETE
Height: 60 in
Weight: 1767.21 [oz_av]

## 2019-01-14 LAB — GLUCOSE, CAPILLARY
Glucose-Capillary: 66 mg/dL — ABNORMAL LOW (ref 70–99)
Glucose-Capillary: 84 mg/dL (ref 70–99)
Glucose-Capillary: 227 mg/dL — ABNORMAL HIGH (ref 70–99)
Glucose-Capillary: 130 mg/dL — ABNORMAL HIGH (ref 70–99)
Glucose-Capillary: 139 mg/dL — ABNORMAL HIGH (ref 70–99)

## 2019-01-14 MED ORDER — FUROSEMIDE 10 MG/ML IJ SOLN: 60.0000 mg | Freq: Two times a day (BID) | INTRAMUSCULAR | Status: DC

## 2019-01-14 MED ORDER — FUROSEMIDE 10 MG/ML IJ SOLN
60.0000 mg | Freq: Once | INTRAMUSCULAR | Status: AC
  Administered 2019-01-14: 60 mg via INTRAVENOUS
  Filled 2019-01-14: qty 6

## 2019-01-14 MED ORDER — METOLAZONE 2.5 MG PO TABS
2.5000 mg | ORAL_TABLET | Freq: Once | ORAL | Status: AC
  Administered 2019-01-14: 2.5 mg via ORAL
  Filled 2019-01-14 (×2): qty 1

## 2019-01-14 MED ORDER — LIVING WELL WITH DIABETES BOOK
Freq: Once | Status: AC
  Administered 2019-01-14: 18:00:00
  Filled 2019-01-14 (×2): qty 1

## 2019-01-14 MED ORDER — INSULIN STARTER KIT- PEN NEEDLES (ENGLISH): 1.0000 | Freq: Once | Status: DC

## 2019-01-14 MED ORDER — FUROSEMIDE 10 MG/ML IJ SOLN
60.0000 mg | Freq: Two times a day (BID) | INTRAMUSCULAR | Status: DC
  Administered 2019-01-14 – 2019-01-16 (×4): 60 mg via INTRAVENOUS
  Filled 2019-01-14 (×4): qty 6

## 2019-01-14 NOTE — Care Management Note (Signed)
Case Management Note  Patient Details  Name: Amber [name] MRN: [id number] Date of Birth: Sep 28, 1956  Subjective/Objective:      Septic shock 2/2E coligastroenteritis w/ bacteremia             [name] (Son) [name] (Daughter)      [phone]         PCP:Yun SUN  Action/Plan: Transition when medically stable with home health services to follow. Pt/ family declined SNF placement. Agreeable to home health services. Son [name] [phone]) states once pt discharges , 24/7 supervision/assistance will be provided by family.Granddaughter [name] lives with pt.  Expected Discharge Date:  01/04/19               Expected Discharge Plan:  Home w Home Health Services  In-House Referral:  Clinical Social Work  Discharge planning Services  CM Consult  Post Acute Care Choice:  NA Choice offered to:  Patient, Adult Children  DME Arranged:  N/A DME Agency:  NA  HH Arranged:  RN, PT, OT, Speech Therapy, Nurse's Aide, Social Work [employer] Agency:  [facility]  Status of Service:  Completed, signed off  If discussed at [employer], dates discussed:    Additional Comments:  Epifanio Lesches, RN 01/14/2019, 9:00 AM

## 2019-01-14 NOTE — Progress Notes (Addendum)
Inpatient Diabetes Program Recommendations  AACE/ADA: New Consensus Statement on Inpatient Glycemic Control (2015)  Target Ranges:  Prepandial:   less than 140 mg/dL      Peak postprandial:   less than 180 mg/dL (1-2 hours)      Critically ill patients:  140 - 180 mg/dL   Lab Results  Component Value Date   GLUCAP 227 (H) 01/14/2019   HGBA1C 5.4 01/11/2019     Noted diabetes consult for diabetic and insulin teaching. Reviewed chart and AVS and currently no insulin ordered for discharge. Did meet with patient regarding her diabetes. Living Well with Diabetes book ordered and RN will give to patient when up from pharmacy. Patient stated her daughter takes her blood sugar every am (and sometime in the evening).    With patient's permission I called daughter Suella Broad) who patient lives with. She verified she checks her mom's blood sugar 1-2 times a day. Stated she had been on oral anti-diabetic agents in the past but has not been recently.  Emphasized with daughter and patient to make sure to follow up with PCP (Dr. Wynelle Link). Encouraged log of CBG to be taken with patient to PCP appointment in case changes need to be made in the future.   Discussed with bedside RN and asked if discharge orders are changed to include insulin to please page diabetes coordinator.   -- Will follow during hospitalization.--  Jamelle Rushing RN, MSN Diabetes Coordinator Inpatient Glycemic Control Team Team Pager: [phone] (8am-5pm)

## 2019-01-14 NOTE — Progress Notes (Addendum)
  Echocardiogram 2D Echocardiogram has been performed.  Technically difficult study due to poor patient compliance.   Amber [name] 01/14/2019, 2:27 PM

## 2019-01-14 NOTE — Progress Notes (Signed)
PT Cancellation Note  Patient Details Name: Amber [name] MRN: [id number] DOB: [birth year]   Cancelled Treatment:    Reason Eval/Treat Not Completed: Medical issues which prohibited therapy, pt reports she is having difficulty breathing and cannot mobilize at this time. Pt also deferred bed level exercises. SpO2 100% on supplemental O2, RR 35, HR 108 bpm. RN notified that pt feels she cannot breathe.   Lyanne Co, PT  Acute Rehab Services  Pager [phone] Office [phone]    [name] 01/14/2019, 3:31 PM

## 2019-01-14 NOTE — Progress Notes (Signed)
PROGRESS NOTE        PATIENT DETAILS Name: Amber [name] Age: 63 y.o. Sex: female Date of Birth: 04-28-1956 Admit Date: 12/29/2018 Admitting Physician Rush Farmer, MD PCP:Sun, Mikeal Hawthorne, MD  Brief Narrative:  Patient is a 63 y.o. female with history of chronic diastolic heart failure, PAF on anticoagulation, embolic CVA,  seizure disorder, rheumatoid arthritis, prior back back surgeries, chronic pain syndrome-presented to the ED for hypotension, was found to have septic shock secondary to gram-negative bacteremia, finished her treatment for this problem thereafter she developed influenza while being in the hospital, this was treated as well. Note at baseline patient is extremely frail and appears much older than her stated 63 years of age, she was also on high doses of narcotics at home and was baseline continuously somnolent when I saw her.  Now narcotics have been tapered off and she has much better mentation and cough reflex. Subsequently she developed acute on chronic diastolic CHF which continues to be a persistent problem so far she has been diuresed over 11 L but still is developing rails and still requires high doses of diuretics.  Will treat echocardiogram and involve cardiology as well.     Subjective: Patient in bed, appears comfortable, denies any headache, no fever, no chest pain or pressure, mild shortness of breath , no abdominal pain. No focal weakness.   Assessment/Plan:  Septic shock secondary to E. coli bacteremia: Sepsis pathophysiology has resolved, has finished her antibiotic total 10-day course.  Cough-shortness of breath and orthopnea.  Combination of hospital-acquired influenza infection + dCHF EF 60%.  She is much better after aggressive diuresis with IV Lasix and Zaroxolyn, finished her course of Tamiflu, reduction of narcotics to improve respiratory effort and cough reflex, all these measures have shown good improvement.  Continue to encourage  her sitting up in chair and using flutter valve for pulmonary toiletry.  He has so far diuresed about 11 L of fluid from this patient who barely weighs 100 pounds, she continues to have reoccurrence of fluid overload requiring more diuretics.  She had a recent echocardiogram few months ago showing a EF of 60%.  I am unsure why she is developing such fluid overload all of a sudden, will repeat echocardiogram and have cardiology provide input.  Repeat IV Lasix and Zaroxolyn again today on 01/14/2019  Her baseline problem is her extreme frail status, being bedbound virtually 24/7, poor cough flex along with chronic use of scheduled high-dose narcotics and poor ability to clear her pulmonary secretions.  Pulmonary has also provided second opinion and basically agree with our line of management. Updated family bedside extensively on 01/09/2019 and they understand the situation better now.   ESBL enterococcus UTI.  More likely colonization.  Currently afebrile without leukocytosis despite being on no antibiotics at this time.  She has been given 1 dose of oral fosfomycin on 01/08/2019, will continue to monitor clinically.    Acute on chronic diastolic CHF.  EF 65% on echocardiogram in 2019.  Repeat IV Lasix at higher dose with repeat dose of Zaroxolyn, dosing daily, she is more responsive when Zaroxolyn is added in terms of diuresis, monitor electrolytes, placed her on low-dose Bystolic if tolerated by blood pressure, encouraged to sit up in chair, oxygen nebulizer treatments PRN.  Repeat echocardiogram on 01/14/2019 and get cardiology input as she continues to have high  diuretic demand.  Abdominal pain: Much improved-she had numerous BMs overnight after she was started on Dulcolax x1, senna/MiraLAX.  She has chronic abdominal pain at baseline from chronic pancreatitis.    Lactic acidosis: Secondary to sepsis, has normalized.  Elevated troponin: Trend is flat-not consistent with ACS-likely demand ischemia.  EKG  without acute changes.  Given poor overall health-bedbound status-best served by conservative management.  PAF: Sinus rhythm-has prior history of embolic stroke-continue Eliquis.  Chronic thrombocytopenia: Unclear etiology-platelets close to usual baseline.  Follow.  Anemia: Suspect has chronic anemia at baseline-worsened by acute illness, has required 1 unit of PRBC transfusion during this hospital stay.  Hemoglobin low but stable-follow.  History of rheumatoid arthritis with chronic steroid use: Appears to be on chronic prednisone-currently on stress dose IV Solu-Cortef-we will slowly taper over the next few days.  Chronic pancreatitis: Continue Creon.  History of seizures: Continue Depakote.  History of CVA: Reviewed records in care everywhere-she has had prior embolic strokes-difficult neurologic exam due to lethargy/weakness-but appears nonfocal-on Eliquis  Generalized deconditioning/worsening debility: Per patient-she has not ambulated in 4 years-mostly bed to wheelchair bound.  She is going to talk to her family about SNF rehab-as per social work-her family wants to take her home.    Chronic pain syndrome (back pain, neck pain, abdominal pain): Had stool burden for which she has been placed on regimen, narcotics were cut in half initially and was stopped them on 01/09/2019 as she continues to have extremely poor cough reflex and appears somnolent most of the time.  Patient was given an option of stopping narcotics or declining further she agreed to stopping narcotics.  RN in room on 01/09/2019.  Hyperkalemia.  Continue diuresis with IV Lasix and Zaroxolyn, low-dose Kayexalate x1.  Repeat BMP in the morning.  Hyperglycemia. ? Undiagnosed diabetes mellitus, A1c stable, sugars were extremely high and now somewhat stable on Lantus + sliding scale, DM education and insulin education ordered to patient and family.  She is eating much better after narcotics were stopped hence I think her diabetes  is being unmasked due to much improved oral intake after narcotics were removed and she has been eating extremely well the last 3 to 4 days.  Lab Results  Component Value Date   HGBA1C 5.4 01/11/2019   CBG (last 3)  Recent Labs    01/13/19 1144 01/13/19 1706 01/13/19 2215  GLUCAP 112* 190* 206*     DVT Prophylaxis: Full dose anticoagulation with Eliquis  Code Status: Full code  Family Communication: None at bedside  Disposition Plan: Remain inpatient-hopefully discharge tomorrow if clinical improvement continues.  Antimicrobial agents: Anti-infectives (From admission, onward)   Start     Dose/Rate Route Frequency Ordered Stop   01/08/19 1345  sulfamethoxazole-trimethoprim (BACTRIM DS,SEPTRA DS) 800-160 MG per tablet 1 tablet     1 tablet Oral Every 12 hours 01/08/19 1334 01/09/19 2313   01/05/19 1000  oseltamivir (TAMIFLU) capsule 30 mg     30 mg Oral 2 times daily 01/05/19 0826 01/09/19 2312   01/04/19 1500  cefTRIAXone (ROCEPHIN) 2 g in sodium chloride 0.9 % 100 mL IVPB  Status:  Discontinued     2 g 200 mL/hr over 30 Minutes Intravenous Every 24 hours 01/04/19 1416 01/07/19 1049   01/04/19 1500  metroNIDAZOLE (FLAGYL) IVPB 500 mg  Status:  Discontinued     500 mg 100 mL/hr over 60 Minutes Intravenous Every 8 hours 01/04/19 1416 01/07/19 1049   01/03/19 1030  sulfamethoxazole-trimethoprim (BACTRIM DS,SEPTRA DS)  800-160 MG per tablet 1 tablet  Status:  Discontinued     1 tablet Oral Every 12 hours 01/03/19 1019 01/04/19 1416   01/03/19 0000  sulfamethoxazole-trimethoprim (BACTRIM DS,SEPTRA DS) 800-160 MG tablet     1 tablet Oral Every 12 hours 01/03/19 1059     12/31/18 1330  vancomycin (VANCOCIN) IVPB 750 mg/150 ml premix  Status:  Discontinued     750 mg 150 mL/hr over 60 Minutes Intravenous Every 48 hours 12/29/18 1738 12/30/18 1127   12/30/18 1200  cefTRIAXone (ROCEPHIN) 2 g in sodium chloride 0.9 % 100 mL IVPB  Status:  Discontinued     2 g 200 mL/hr over 30  Minutes Intravenous Every 24 hours 12/30/18 1127 01/03/19 1019   12/30/18 0130  ceFEPIme (MAXIPIME) 1 g in sodium chloride 0.9 % 100 mL IVPB  Status:  Discontinued     1 g 200 mL/hr over 30 Minutes Intravenous Every 12 hours 12/29/18 1750 12/30/18 1127   12/29/18 1715  ceFEPIme (MAXIPIME) 2 g in sodium chloride 0.9 % 100 mL IVPB  Status:  Discontinued     2 g 200 mL/hr over 30 Minutes Intravenous  Once 12/29/18 1706 12/29/18 1750   12/29/18 1715  metroNIDAZOLE (FLAGYL) IVPB 500 mg  Status:  Discontinued     500 mg 100 mL/hr over 60 Minutes Intravenous Every 8 hours 12/29/18 1706 12/30/18 1127   12/29/18 1715  vancomycin (VANCOCIN) IVPB 1000 mg/200 mL premix  Status:  Discontinued     1,000 mg 200 mL/hr over 60 Minutes Intravenous  Once 12/29/18 1706 12/29/18 1750   12/29/18 1230  ceFEPIme (MAXIPIME) 2 g in sodium chloride 0.9 % 100 mL IVPB     2 g 200 mL/hr over 30 Minutes Intravenous  Once 12/29/18 1226 12/29/18 1424   12/29/18 1230  metroNIDAZOLE (FLAGYL) IVPB 500 mg  Status:  Discontinued     500 mg 100 mL/hr over 60 Minutes Intravenous Every 8 hours 12/29/18 1226 12/29/18 1706   12/29/18 1230  vancomycin (VANCOCIN) IVPB 1000 mg/200 mL premix     1,000 mg 200 mL/hr over 60 Minutes Intravenous  Once 12/29/18 1226 12/29/18 1551      Procedures: None  CONSULTS: Palliative care, pulmonary, Cards  Time spent: 25- minutes-Greater than 50% of this time was spent in counseling, explanation of diagnosis, planning of further management, and coordination of care.  MEDICATIONS: Scheduled Meds: . allopurinol  300 mg Oral Daily  . apixaban  2.5 mg Oral BID  . feeding supplement (GLUCERNA SHAKE)  237 mL Oral TID BM  . furosemide  60 mg Intravenous Once  . insulin aspart  0-5 Units Subcutaneous QHS  . insulin aspart  0-9 Units Subcutaneous TID WC  . insulin glargine  15 Units Subcutaneous Daily  . lipase/protease/amylase  36,000 Units Oral TID AC  . mouth rinse  15 mL Mouth Rinse BID    . metolazone  2.5 mg Oral Once  . nebivolol  2.5 mg Oral Daily  . pantoprazole  40 mg Oral BID  . predniSONE  5 mg Oral Q breakfast  . valproic acid  250 mg Oral TID   Continuous Infusions:  PRN Meds:.acetaminophen, dextrose, diltiazem, hydrOXYzine, ipratropium-albuterol, naLOXone (NARCAN)  injection, simethicone   PHYSICAL EXAM:  Vital signs: Vitals:   01/13/19 1348 01/13/19 2021 01/13/19 2149 01/14/19 0520  BP: 95/78  100/74 99/72  Pulse:   (!) 106 98  Resp:      Temp: (!) 97.5 F (36.4 C) 98 F (  36.7 C)  98.4 F (36.9 C)  TempSrc: Oral Oral  Oral  SpO2: 100%  99% 93%  Weight:      Height:       Filed Weights   12/29/18 1229 12/29/18 2040  Weight: 40.5 kg 50.1 kg   Body mass index is 21.57 kg/m.   Exam  Awake Alert, frail African-American female who looks much older than her stated age, however overall looks better, few rales [place].AT,PERRAL Supple Neck,No JVD, No cervical lymphadenopathy appriciated.  Symmetrical Chest wall movement, Good air movement bilaterally, Fine rales RRR,No Gallops, Rubs or new Murmurs, No Parasternal Heave +ve B.Sounds, Abd Soft, No tenderness, No organomegaly appriciated, No rebound - guarding or rigidity. No Cyanosis, Clubbing or edema, No new Rash or bruise    I have personally reviewed following labs and imaging studies  LABORATORY DATA: CBC: Recent Labs  Lab 01/08/19 0429 01/10/19 0741 01/11/19 0416  WBC 8.9 6.9 14.5*  HGB 8.5* 8.3* 8.2*  HCT 25.0* 24.5* 24.6*  MCV 76.9* 76.1* 76.9*  PLT 136* 187 130    Basic Metabolic Panel: Recent Labs  Lab 01/08/19 0429  01/09/19 0432 01/10/19 0741 01/11/19 0416 01/12/19 0547 01/13/19 0554 01/14/19 0555  NA 138  --  134* 133* 134* 131*  --  136  K 5.6*   < > 4.5 3.3* 3.3* 5.2* 5.7* 4.8  CL 103  --  98 89* 94* 95*  --  101  CO2 26  --  27 33* 30 26  --  26  GLUCOSE 253*  --  68* 267* 104* 313*  --  106*  BUN 6  --  '7 12 16 '$ 21*  --  36*  CREATININE 0.77  --  0.91 1.22*  1.07* 0.97  --  0.87  CALCIUM 7.6*  --  7.9* 7.5* 7.7* 8.0*  --  8.1*  MG 1.6*  --  1.7 1.5* 2.2 2.0  --   --    < > = values in this interval not displayed.    GFR: Estimated Creatinine Clearance: 49.4 mL/min (by C-G formula based on SCr of 0.87 mg/dL).  Liver Function Tests: No results for input(s): AST, ALT, ALKPHOS, BILITOT, PROT, ALBUMIN in the last 168 hours. No results for input(s): LIPASE, AMYLASE in the last 168 hours. No results for input(s): AMMONIA in the last 168 hours.  Coagulation Profile: No results for input(s): INR, PROTIME in the last 168 hours.  Cardiac Enzymes: No results for input(s): CKTOTAL, CKMB, CKMBINDEX, TROPONINI in the last 168 hours.  BNP (last 3 results) No results for input(s): PROBNP in the last 8760 hours.  HbA1C: No results for input(s): HGBA1C in the last 72 hours.  CBG: Recent Labs  Lab 01/13/19 0414 01/13/19 0746 01/13/19 1144 01/13/19 1706 01/13/19 2215  GLUCAP 197* 167* 112* 190* 206*    Lipid Profile: No results for input(s): CHOL, HDL, LDLCALC, TRIG, CHOLHDL, LDLDIRECT in the last 72 hours.  Thyroid Function Tests: No results for input(s): TSH, T4TOTAL, FREET4, T3FREE, THYROIDAB in the last 72 hours.  Anemia Panel: No results for input(s): VITAMINB12, FOLATE, FERRITIN, TIBC, IRON, RETICCTPCT in the last 72 hours.  Urine analysis:    Component Value Date/Time   COLORURINE YELLOW 01/04/2019 2048   APPEARANCEUR HAZY (A) 01/04/2019 2048   LABSPEC 1.015 01/04/2019 2048   PHURINE 7.0 01/04/2019 2048   GLUCOSEU NEGATIVE 01/04/2019 2048   HGBUR SMALL (A) 01/04/2019 2048   BILIRUBINUR NEGATIVE 01/04/2019 2048   [place] 01/04/2019 2048  PROTEINUR 30 (A) 01/04/2019 2048   UROBILINOGEN 1.0 07/27/2015 1052   NITRITE NEGATIVE 01/04/2019 2048   LEUKOCYTESUR NEGATIVE 01/04/2019 2048    Sepsis Labs: Lactic Acid, Venous    Component Value Date/Time   LATICACIDVEN 1.6 01/01/2019 0953    MICROBIOLOGY: Recent  Results (from the past 240 hour(s))  Culture, blood (routine x 2)     Status: None   Collection Time: 01/04/19  6:38 PM  Result Value Ref Range Status   Specimen Description BLOOD LEFT HAND  Final   Special Requests   Final    BOTTLES DRAWN AEROBIC ONLY Blood Culture adequate volume   Culture   Final    NO GROWTH 5 DAYS Performed at [facility], [place] [address] [place], [place] [address]    Report Status 01/09/2019 FINAL  Final  Culture, blood (routine x 2)     Status: None   Collection Time: 01/04/19  6:38 PM  Result Value Ref Range Status   Specimen Description BLOOD RIGHT HAND  Final   Special Requests   Final    BOTTLES DRAWN AEROBIC ONLY Blood Culture results may not be optimal due to an excessive volume of blood received in culture bottles   Culture   Final    NO GROWTH 5 DAYS Performed at [facility], [place] [address] [place], [place] [address]    Report Status 01/09/2019 FINAL  Final  Culture, Urine     Status: Abnormal   Collection Time: 01/04/19  8:48 PM  Result Value Ref Range Status   Specimen Description URINE, RANDOM  Final   Special Requests   Final    NONE Performed at [facility], [place] [address], [place], [place] [address]    Culture (A)  Final    >=100,000 COLONIES/mL VANCOMYCIN RESISTANT ENTEROCOCCUS ISOLATED   Report Status 01/07/2019 FINAL  Final   Organism ID, Bacteria VANCOMYCIN RESISTANT ENTEROCOCCUS ISOLATED (A)  Final      Susceptibility   Vancomycin resistant enterococcus isolated - MIC*    AMPICILLIN >=32 RESISTANT Resistant     LEVOFLOXACIN >=8 RESISTANT Resistant     NITROFURANTOIN 32 SENSITIVE Sensitive     VANCOMYCIN >=32 RESISTANT Resistant     LINEZOLID 2 SENSITIVE Sensitive     * >=100,000 COLONIES/mL VANCOMYCIN RESISTANT ENTEROCOCCUS ISOLATED    RADIOLOGY STUDIES/RESULTS: Ct Abdomen Pelvis Wo Contrast  Result Date: 12/29/2018 CLINICAL DATA:  Abdomen distension two days ago here for food poisoning Allergic to iv  contrast no oral contrast per MD EXAM: CT ABDOMEN AND PELVIS WITHOUT CONTRAST TECHNIQUE: Multidetector CT imaging of the abdomen and pelvis was performed following the standard protocol without IV contrast. COMPARISON:  12/27/2018 FINDINGS: Lower chest: There is bibasilar atelectasis. Hepatobiliary: The liver is homogeneous. Status post cholecystectomy. Pancreas: Pancreas is atrophic. Coarse calcifications throughout the pancreatic body and tail suggests history of chronic pancreatitis. Spleen: Normal in size without focal abnormality. Adrenals/Urinary Tract: Adrenal glands are unremarkable. Kidneys are normal, without renal calculi, focal lesion, or hydronephrosis. Bladder is unremarkable. Stomach/Bowel: Stomach is unremarkable. Small bowel loops are normal in appearance. There are numerous colonic diverticula. No acute diverticulitis. Significant stool within the rectosigmoid colon. There has been some improvement in the appearance of thickening of the rectosigmoid colon. No perirectal abscess. Vascular/Lymphatic: There is dense atherosclerotic calcification of the abdominal aorta not associated with aneurysm. No retroperitoneal or mesenteric adenopathy. Reproductive: The uterus is present. Multiple mural calcifications are present. No adnexal mass. Other: No ascites. No  abdominal wall hernia. Note is made of fat within the LEFT inguinal ring, likely a lipoma. Diffuse body wall edema. Musculoskeletal: Remote wedge compression fractures of T12, L1, and L3. There is 2 millimeters anterolisthesis of L4 on L5. Degenerative changes are identified at L4-5 and L5-S1. No suspicious lytic or blastic lesions are identified. IMPRESSION: 1. Significant stool within the rectosigmoid colon. There has been some improvement in the appearance of thickening of the rectosigmoid colon. 2. Diverticulosis without acute diverticulitis. 3. Status post cholecystectomy. 4. Changes of chronic pancreatitis. 5. Remote wedge compression  fractures of T12, L1, and L3. Aortic Atherosclerosis (ICD10-I70.0). Electronically Signed   By: Nolon Nations M.D.   On: 12/29/2018 13:12   Dg Chest 2 View  Result Date: 01/11/2019 CLINICAL DATA:  Shortness of breath. EXAM: CHEST - 2 VIEW COMPARISON:  Chest x-ray from yesterday. FINDINGS: Stable cardiomegaly. Improving interstitial edema superimposed on chronic interstitial changes. No focal consolidation, pleural effusion, or pneumothorax. Unchanged thoracic spine hardware. IMPRESSION: 1. Improving pulmonary edema. Electronically Signed   By: Titus Dubin M.D.   On: 01/11/2019 10:49   Dg Chest Port 1 View  Result Date: 01/10/2019 CLINICAL DATA:  Short of breath.  Chest pain.  Weakness. EXAM: PORTABLE CHEST 1 VIEW COMPARISON:  01/08/2019 FINDINGS: Mild cardiomegaly. Low volumes. Diffuse airspace opacities throughout both lungs. Small pleural effusions. Spinal stabilization hardware is stable. No pneumothorax or pleural effusion. IMPRESSION: Stable diffuse bilateral airspace opacities. Electronically Signed   By: Marybelle Killings M.D.   On: 01/10/2019 08:14   Dg Chest Port 1 View  Result Date: 01/08/2019 CLINICAL DATA:  Shortness of breath, asthma, hypertension, history of stroke, GERD, former smoker EXAM: PORTABLE CHEST 1 VIEW COMPARISON:  Portable exam 0650 hours compared to 01/06/2019 FINDINGS: Enlargement of cardiac silhouette. Stable mediastinal contours. Diffuse BILATERAL pulmonary infiltrates question pulmonary edema though infection is not excluded. No gross pleural effusion or pneumothorax. Bones demineralized with prior thoracic spinal fixation. BILATERAL glenohumeral degenerative changes. Old RIGHT rib fractures. Chronic lytic lesion at proximal RIGHT humerus. IMPRESSION: Enlargement of cardiac silhouette with peripheral cyst and diffuse pulmonary infiltrates favoring pulmonary edema though infection is not excluded. No interval change. Electronically Signed   By: Lavonia Dana M.D.   On:  01/08/2019 09:51   Dg Chest Port 1 View  Result Date: 01/06/2019 CLINICAL DATA:  Cough and congestion. EXAM: PORTABLE CHEST 1 VIEW COMPARISON:  January 05, 2019 FINDINGS: Stable cardiomegaly. Stable small pleural effusions. Increasing pulmonary opacities suggestive of worsening edema. Persistent lytic lesion in the proximal right humerus. IMPRESSION: 1. Cardiomegaly and worsening pulmonary edema.  Small effusions. 2. Persistent lytic lesion in the proximal right humerus. Gout was suggested on previous right shoulder x-rays. Electronically Signed   By: Dorise Bullion III M.D   On: 01/06/2019 09:09   Dg Chest Port 1 View  Result Date: 01/05/2019 CLINICAL DATA:  Five day history of shortness of breath. Acute mid chest pain that began today. EXAM: PORTABLE CHEST 1 VIEW COMPARISON:  01/04/2019 and earlier. FINDINGS: Cardiac silhouette moderately enlarged, unchanged. Increasing interstitial opacities superimposed upon the previously identified interstitial lung disease. Stable small BILATERAL pleural effusions. Prior thoracic spine corpectomy and fusion. Lytic lesion in the proximal RIGHT humerus. Severe degenerative changes involving both shoulders. IMPRESSION: 1. Developing interstitial pulmonary edema is suspected, superimposed upon the previously identified interstitial lung disease. 2. Stable small BILATERAL pleural effusions. 3. Lytic lesion involving the proximal RIGHT humerus. Does the patient have a known malignancy or multiple myeloma? Electronically Signed  By: Evangeline Dakin M.D.   On: 01/05/2019 08:58   Dg Chest Port 1 View  Result Date: 01/04/2019 CLINICAL DATA:  Difficulty breathing EXAM: PORTABLE CHEST 1 VIEW COMPARISON:  December 29, 2018; November 13, 2018 FINDINGS: Diffuse interstitial thickening remains stable. There is no frank edema consolidation. Heart is upper normal in size with pulmonary vascularity normal. No adenopathy. Extensive postoperative change in the thoracic spine noted.  Evidence of old right-sided rib trauma laterally. There is arthropathy in the shoulders, stable. Cystic area in the proximal right humerus is stable. IMPRESSION: Diffuse interstitial thickening, likely reflecting chronic fibrosis. No frank edema or consolidation evident. Stable cardiac silhouette. Extensive postoperative change in the thoracic spine. Advanced arthropathy in the shoulders. Prior rib trauma on the right. Electronically Signed   By: Lowella Grip III M.D.   On: 01/04/2019 08:51   Dg Chest Port 1 View  Result Date: 12/29/2018 CLINICAL DATA:  63 year old female with altered mental status, fever, decreased p.o. intake. EXAM: PORTABLE CHEST 1 VIEW COMPARISON:  Portable chest 11/13/2018 and earlier. FINDINGS: Portable AP semi upright view at 1320 hours. Sequelae of previous thoracic spine corpectomy and posterior fusion. Hardware appears stable. Stable visualized osseous structures. Stable lung volumes and mediastinal contours. Visualized tracheal air column is within normal limits. Chronic increased pulmonary interstitial markings appear stable since 2019. No pneumothorax, pleural effusion or acute pulmonary opacity. Mildly increased bowel gas in the abdomen. Stable cholecystectomy clips. IMPRESSION: Stable.  No acute cardiopulmonary abnormality. Electronically Signed   By: Genevie Ann M.D.   On: 12/29/2018 14:03   Dg Abd Portable 1v  Result Date: 12/30/2018 CLINICAL DATA:  Abdominal pain EXAM: PORTABLE ABDOMEN - 1 VIEW COMPARISON:  CT 12/29/2018 FINDINGS: Surgical hardware in the lower thoracic spine. Dense aortic atherosclerosis. Mild diffuse increased bowel gas without obstructive pattern. Possible bowel wall thickening at the hepatic flexure and descending colon. No radiopaque calculi. IMPRESSION: Diffuse increased bowel gas without obstructive pattern. There may be bowel wall edema at the hepatic flexure and descending colon as may be seen with colitis Electronically Signed   By: Donavan Foil  M.D.   On: 12/30/2018 21:45   Dg Abd Portable 2v  Result Date: 01/01/2019 CLINICAL DATA:  Vomiting. EXAM: PORTABLE ABDOMEN - 2 VIEW COMPARISON:  Radiograph of December 30, 2018. FINDINGS: No small bowel dilatation is noted. Mildly dilated colon loop is seen in right side of abdomen which most likely represents ileus. Status post cholecystectomy. There is no evidence of free air. No radio-opaque calculi or other significant radiographic abnormality is seen. IMPRESSION: Mildly dilated colonic loops seen in right side of abdomen which may represent ileus. Electronically Signed   By: Marijo Conception, M.D.   On: 01/01/2019 14:50     LOS: 16 days   Signature  Lala Lund M.D on 01/14/2019 at 8:19 AM   -  To page go to [email or web address]

## 2019-01-14 NOTE — Consult Note (Addendum)
Cardiology Consult    Patient ID: Amber [name] MRN: [id number], DOB/AGE: 61/13/1957   Admit date: 12/29/2018 Date of Consult: 01/14/2019  Primary Physician: Sandi Mariscal, MD Primary Cardiologist: Previously seen by [facility] Sherren Mocha, MD (for PFO). Requesting Provider: Lala Lund, MD  Patient Profile    Amber [name] is a 64 y.o. female with a history of chronic diastolic CHF, CVA in 2774, hypertension, type 2 diabetes mellitus, seizures, GERD, chronic pancreatitis, asthma, rheumatoid arthritis with chronic steroid use, and chronic pain, who is being seen today for the evaluation of CHF at the request of Dr. Candiss Norse.  History of Present Illness    Amber [name] is a 62 year old female with the above history. Patient was admitted in 10/2016 for a stroke felt to be embolic in nature. TEE at that time showed PFO with large right to left shunt. Patient was seen by Dr. Burt Knack to discuss PFO closure. The plan was to see patient in the office to schedule her PFO closure after she completed rehabilitation in a skilled nursing facility. However, patient never followed up with Dr. Burt Knack. She was admitted in 11/2016 for atypical chest pain. Myoview at that time showed no ischemia or septal hypokinesis.   Patient presented to the [facility] ED via EMS on 12/29/2018 for evaluation of altered mental status and decreased oral intake. Per chart review, EMS reports patient was hypoxic and febrile with temp of 103 when they arrived and initial systolic BP in the 12'I and CBGs in the 70's. Patient was admitted for septic shock secondary to infectious gastroenteritis with bacteremia and was started on IV antibiotics. Patient was initially going to be discharged on 01/03/2019; however, she developed a fever of 102. She tested positive for Influenza B at that time and urine culture came back positive for vancomycin resistant enterococcus. BNP elevated at 984.3 and IV Lasix was started. Chest x-ray on  2/16 suspicious for developing interstitial pulmonary edema. Most recent chest x-ray showed pulmonary edema was improving with Lasix.  Cardiology was consulted today to assist with management of acute on chronic CHF given persistent shortness of breath and crackles on exam. Patient reports shortness of breath which is worse with any activity as well as some PND; however, she states she has been able to sleep flat at night. She denies any lower extremity edema. She reports some heart racing but denies any chest pain, lightheadedness, or dizziness. She states she continues to have some mild abdominal pain as well as chronic back pain.   Per chart review, patient's baseline problem is felt to to be her "extreme frail status, being bedbound virtually 24/7, poor cough flex along with chronic use of scheduled high-dose narcotics and poor ability to clear her pulmonary secretions." All narcotics were stopped on 2/20 which seemed to help some.  Past Medical History   Past Medical History:  Diagnosis Date  . ACS (acute coronary syndrome) ([place]) 06/12/2015  . Acute on chronic blood loss anemia 11/25/2017  . Arthritis   . Asthma   . Cerebrovascular accident (CVA) due to embolism ([place]) 10/26/2016  . Chronic pain   . Constipation   . Diabetes mellitus without complication ([place])   . Duodenitis   . Embolic stroke ([place])   . Falls frequently   . Focal seizure ([place])   . GERD (gastroesophageal reflux disease)   . Hypertension   . Impacted fracture 02/15/2017   right glenoid  . Pneumonia 10/2015  . Seizures ([place])  last seizure March 2015  . Small bowel obstruction ([place]) 11/25/2017    Past Surgical History:  Procedure Laterality Date  . APPENDECTOMY    . BACK SURGERY    . CHOLECYSTECTOMY    . TEE WITHOUT CARDIOVERSION N/A 10/30/2016   Procedure: TRANSESOPHAGEAL ECHOCARDIOGRAM (TEE);  Surgeon: Dorothy Spark, MD;  Location: [facility] ENDOSCOPY;  Service: Cardiovascular;  Laterality: N/A;      Allergies  Allergies  Allergen Reactions  . Ivp Dye [Iodinated Diagnostic Agents] Itching  . Metrizamide Itching    Inpatient Medications    . allopurinol  300 mg Oral Daily  . apixaban  2.5 mg Oral BID  . feeding supplement (GLUCERNA SHAKE)  237 mL Oral TID BM  . insulin aspart  0-5 Units Subcutaneous QHS  . insulin aspart  0-9 Units Subcutaneous TID WC  . insulin glargine  15 Units Subcutaneous Daily  . lipase/protease/amylase  36,000 Units Oral TID AC  . living well with diabetes book   Does not apply Once  . mouth rinse  15 mL Mouth Rinse BID  . nebivolol  2.5 mg Oral Daily  . pantoprazole  40 mg Oral BID  . predniSONE  5 mg Oral Q breakfast  . valproic acid  250 mg Oral TID    Family History    Family History  Problem Relation Age of Onset  . Kidney failure Mother   . Hypertension Sister   . Hypertension Brother    She indicated that her mother is deceased. She indicated that her father is deceased. She indicated that the status of her sister is unknown. She indicated that the status of her brother is unknown. She indicated that her maternal grandmother is deceased. She indicated that her maternal grandfather is deceased. She indicated that her paternal grandmother is deceased. She indicated that her paternal grandfather is deceased.   Social History    Social History   Socioeconomic History  . Marital status: Single    Spouse name: Not on file  . Number of children: Not on file  . Years of education: Not on file  . Highest education level: Not on file  Occupational History  . Not on file  Social Needs  . Financial resource strain: Not on file  . Food insecurity:    Worry: Not on file    Inability: Not on file  . Transportation needs:    Medical: Not on file    Non-medical: Not on file  Tobacco Use  . Smoking status: Former Smoker    Last attempt to quit: 2010    Years since quitting: 10.1  . Smokeless tobacco: Never Used  Substance and Sexual  Activity  . Alcohol use: No    Comment: admits to 2 drinks/week  . Drug use: No  . Sexual activity: Never  Lifestyle  . Physical activity:    Days per week: Not on file    Minutes per session: Not on file  . Stress: Not on file  Relationships  . Social connections:    Talks on phone: Not on file    Gets together: Not on file    Attends religious service: Not on file    Active member of club or organization: Not on file    Attends meetings of clubs or organizations: Not on file    Relationship status: Not on file  . Intimate partner violence:    Fear of current or ex partner: Not on file    Emotionally abused: Not on  file    Physically abused: Not on file    Forced sexual activity: Not on file  Other Topics Concern  . Not on file  Social History Narrative  . Not on file     Review of Systems    See HPI: Review of Systems  Constitutional: Positive for fever.  HENT: Positive for congestion.   Respiratory: Positive for cough, sputum production and shortness of breath. Negative for hemoptysis.   Cardiovascular: Positive for palpitations and PND. Negative for chest pain, orthopnea and leg swelling.  Gastrointestinal: Positive for abdominal pain. Negative for blood in stool, nausea and vomiting.  Genitourinary: Negative for hematuria.  Musculoskeletal: Positive for back pain (chronic).  Neurological: Positive for weakness. Negative for dizziness and loss of consciousness.  All other systems reviewed and are negative.   Physical Exam    Blood pressure 99/79, pulse 100, temperature 98.4 F (36.9 C), temperature source Oral, resp. rate (!) 25, height 5' (1.524 m), weight 50.1 kg, SpO2 100 %.  General: 63 y.o. frail, ill-appearing African-American female who appears older than stated age. Alert and mildly tachypneic but no acute distress.  HEENT: Normal  Neck: Supple. No JVD appreciated. Lungs: Mild increased work of breathing with mild tachypnea. Crackles noted bilaterally  with few scattered wheezes. Heart: Tachycardic with regular rhythm. Distinct S1 and S2. No significant murmurs, gallops, or rubs.  Abdomen: Soft, non-distended, and non-tender to palpation. Bowel sounds present.   Extremities: No clubbing, cyanosis or edema. Radial pulses and equal bilaterally. Skin: Warm and dry. Neuro: Alert and oriented x3. No focal deficits. Moves all extremities spontaneously. Psych: Normal affect. Responds appropriately.   Labs    Troponin (Point of Care Test) No results for input(s): TROPIPOC in the last 72 hours. No results for input(s): CKTOTAL, CKMB, TROPONINI in the last 72 hours. Lab Results  Component Value Date   WBC 14.5 (H) 01/11/2019   HGB 8.2 (L) 01/11/2019   HCT 24.6 (L) 01/11/2019   MCV 76.9 (L) 01/11/2019   PLT 217 01/11/2019    Recent Labs  Lab 01/14/19 0555  NA 136  K 4.8  CL 101  CO2 26  BUN 36*  CREATININE 0.87  CALCIUM 8.1*  GLUCOSE 106*   Lab Results  Component Value Date   CHOL 105 10/27/2016   HDL 61 10/27/2016   LDLCALC 33 10/27/2016   TRIG 55 10/27/2016   Lab Results  Component Value Date   DDIMER 2.59 (H) 11/23/2016     Radiology Studies    Ct Abdomen Pelvis Wo Contrast  Result Date: 12/29/2018 CLINICAL DATA:  Abdomen distension two days ago here for food poisoning Allergic to iv contrast no oral contrast per MD EXAM: CT ABDOMEN AND PELVIS WITHOUT CONTRAST TECHNIQUE: Multidetector CT imaging of the abdomen and pelvis was performed following the standard protocol without IV contrast. COMPARISON:  12/27/2018 FINDINGS: Lower chest: There is bibasilar atelectasis. Hepatobiliary: The liver is homogeneous. Status post cholecystectomy. Pancreas: Pancreas is atrophic. Coarse calcifications throughout the pancreatic body and tail suggests history of chronic pancreatitis. Spleen: Normal in size without focal abnormality. Adrenals/Urinary Tract: Adrenal glands are unremarkable. Kidneys are normal, without renal calculi, focal  lesion, or hydronephrosis. Bladder is unremarkable. Stomach/Bowel: Stomach is unremarkable. Small bowel loops are normal in appearance. There are numerous colonic diverticula. No acute diverticulitis. Significant stool within the rectosigmoid colon. There has been some improvement in the appearance of thickening of the rectosigmoid colon. No perirectal abscess. Vascular/Lymphatic: There is dense atherosclerotic calcification of the abdominal  aorta not associated with aneurysm. No retroperitoneal or mesenteric adenopathy. Reproductive: The uterus is present. Multiple mural calcifications are present. No adnexal mass. Other: No ascites. No abdominal wall hernia. Note is made of fat within the LEFT inguinal ring, likely a lipoma. Diffuse body wall edema. Musculoskeletal: Remote wedge compression fractures of T12, L1, and L3. There is 2 millimeters anterolisthesis of L4 on L5. Degenerative changes are identified at L4-5 and L5-S1. No suspicious lytic or blastic lesions are identified. IMPRESSION: 1. Significant stool within the rectosigmoid colon. There has been some improvement in the appearance of thickening of the rectosigmoid colon. 2. Diverticulosis without acute diverticulitis. 3. Status post cholecystectomy. 4. Changes of chronic pancreatitis. 5. Remote wedge compression fractures of T12, L1, and L3. Aortic Atherosclerosis (ICD10-I70.0). Electronically Signed   By: Nolon Nations M.D.   On: 12/29/2018 13:12   Dg Chest 2 View  Result Date: 01/11/2019 CLINICAL DATA:  Shortness of breath. EXAM: CHEST - 2 VIEW COMPARISON:  Chest x-ray from yesterday. FINDINGS: Stable cardiomegaly. Improving interstitial edema superimposed on chronic interstitial changes. No focal consolidation, pleural effusion, or pneumothorax. Unchanged thoracic spine hardware. IMPRESSION: 1. Improving pulmonary edema. Electronically Signed   By: Titus Dubin M.D.   On: 01/11/2019 10:49   Dg Chest Port 1 View  Result Date:  01/10/2019 CLINICAL DATA:  Short of breath.  Chest pain.  Weakness. EXAM: PORTABLE CHEST 1 VIEW COMPARISON:  01/08/2019 FINDINGS: Mild cardiomegaly. Low volumes. Diffuse airspace opacities throughout both lungs. Small pleural effusions. Spinal stabilization hardware is stable. No pneumothorax or pleural effusion. IMPRESSION: Stable diffuse bilateral airspace opacities. Electronically Signed   By: Marybelle Killings M.D.   On: 01/10/2019 08:14   Dg Chest Port 1 View  Result Date: 01/08/2019 CLINICAL DATA:  Shortness of breath, asthma, hypertension, history of stroke, GERD, former smoker EXAM: PORTABLE CHEST 1 VIEW COMPARISON:  Portable exam 0650 hours compared to 01/06/2019 FINDINGS: Enlargement of cardiac silhouette. Stable mediastinal contours. Diffuse BILATERAL pulmonary infiltrates question pulmonary edema though infection is not excluded. No gross pleural effusion or pneumothorax. Bones demineralized with prior thoracic spinal fixation. BILATERAL glenohumeral degenerative changes. Old RIGHT rib fractures. Chronic lytic lesion at proximal RIGHT humerus. IMPRESSION: Enlargement of cardiac silhouette with peripheral cyst and diffuse pulmonary infiltrates favoring pulmonary edema though infection is not excluded. No interval change. Electronically Signed   By: Lavonia Dana M.D.   On: 01/08/2019 09:51   Dg Chest Port 1 View  Result Date: 01/06/2019 CLINICAL DATA:  Cough and congestion. EXAM: PORTABLE CHEST 1 VIEW COMPARISON:  January 05, 2019 FINDINGS: Stable cardiomegaly. Stable small pleural effusions. Increasing pulmonary opacities suggestive of worsening edema. Persistent lytic lesion in the proximal right humerus. IMPRESSION: 1. Cardiomegaly and worsening pulmonary edema.  Small effusions. 2. Persistent lytic lesion in the proximal right humerus. Gout was suggested on previous right shoulder x-rays. Electronically Signed   By: Dorise Bullion III M.D   On: 01/06/2019 09:09   Dg Chest Port 1 View  Result  Date: 01/05/2019 CLINICAL DATA:  Five day history of shortness of breath. Acute mid chest pain that began today. EXAM: PORTABLE CHEST 1 VIEW COMPARISON:  01/04/2019 and earlier. FINDINGS: Cardiac silhouette moderately enlarged, unchanged. Increasing interstitial opacities superimposed upon the previously identified interstitial lung disease. Stable small BILATERAL pleural effusions. Prior thoracic spine corpectomy and fusion. Lytic lesion in the proximal RIGHT humerus. Severe degenerative changes involving both shoulders. IMPRESSION: 1. Developing interstitial pulmonary edema is suspected, superimposed upon the previously identified interstitial lung disease. 2.  Stable small BILATERAL pleural effusions. 3. Lytic lesion involving the proximal RIGHT humerus. Does the patient have a known malignancy or multiple myeloma? Electronically Signed   By: Evangeline Dakin M.D.   On: 01/05/2019 08:58   Dg Chest Port 1 View  Result Date: 01/04/2019 CLINICAL DATA:  Difficulty breathing EXAM: PORTABLE CHEST 1 VIEW COMPARISON:  December 29, 2018; November 13, 2018 FINDINGS: Diffuse interstitial thickening remains stable. There is no frank edema consolidation. Heart is upper normal in size with pulmonary vascularity normal. No adenopathy. Extensive postoperative change in the thoracic spine noted. Evidence of old right-sided rib trauma laterally. There is arthropathy in the shoulders, stable. Cystic area in the proximal right humerus is stable. IMPRESSION: Diffuse interstitial thickening, likely reflecting chronic fibrosis. No frank edema or consolidation evident. Stable cardiac silhouette. Extensive postoperative change in the thoracic spine. Advanced arthropathy in the shoulders. Prior rib trauma on the right. Electronically Signed   By: Lowella Grip III M.D.   On: 01/04/2019 08:51   Dg Chest Port 1 View  Result Date: 12/29/2018 CLINICAL DATA:  63 year old female with altered mental status, fever, decreased p.o. intake.  EXAM: PORTABLE CHEST 1 VIEW COMPARISON:  Portable chest 11/13/2018 and earlier. FINDINGS: Portable AP semi upright view at 1320 hours. Sequelae of previous thoracic spine corpectomy and posterior fusion. Hardware appears stable. Stable visualized osseous structures. Stable lung volumes and mediastinal contours. Visualized tracheal air column is within normal limits. Chronic increased pulmonary interstitial markings appear stable since 2019. No pneumothorax, pleural effusion or acute pulmonary opacity. Mildly increased bowel gas in the abdomen. Stable cholecystectomy clips. IMPRESSION: Stable.  No acute cardiopulmonary abnormality. Electronically Signed   By: Genevie Ann M.D.   On: 12/29/2018 14:03   Dg Abd Portable 1v  Result Date: 12/30/2018 CLINICAL DATA:  Abdominal pain EXAM: PORTABLE ABDOMEN - 1 VIEW COMPARISON:  CT 12/29/2018 FINDINGS: Surgical hardware in the lower thoracic spine. Dense aortic atherosclerosis. Mild diffuse increased bowel gas without obstructive pattern. Possible bowel wall thickening at the hepatic flexure and descending colon. No radiopaque calculi. IMPRESSION: Diffuse increased bowel gas without obstructive pattern. There may be bowel wall edema at the hepatic flexure and descending colon as may be seen with colitis Electronically Signed   By: Donavan Foil M.D.   On: 12/30/2018 21:45   Dg Abd Portable 2v  Result Date: 01/01/2019 CLINICAL DATA:  Vomiting. EXAM: PORTABLE ABDOMEN - 2 VIEW COMPARISON:  Radiograph of December 30, 2018. FINDINGS: No small bowel dilatation is noted. Mildly dilated colon loop is seen in right side of abdomen which most likely represents ileus. Status post cholecystectomy. There is no evidence of free air. No radio-opaque calculi or other significant radiographic abnormality is seen. IMPRESSION: Mildly dilated colonic loops seen in right side of abdomen which may represent ileus. Electronically Signed   By: Marijo Conception, M.D.   On: 01/01/2019 14:50    EKG      EKG: EKG from 01/04/2019 was personally reviewed and demonstrates: Sinus tachycardia, rate 120, with no acute ischemic changes.   Telemetry: Telemetry was personally reviewed and demonstrates: Sinus tachycardia with heart rates in the 100's to 120's.   Cardiac Imaging    Echocardiogram 09/25/2018: Study Conclusions: - Left ventricle: The cavity size was normal. Wall thickness was   normal. Systolic function was vigorous. The estimated ejection   fraction was in the range of 65% to 70%. Wall motion was normal;   there were no regional wall motion abnormalities. Doppler   parameters  are consistent with abnormal left ventricular   relaxation (grade 1 diastolic dysfunction). - Aortic valve: Valve area (VTI): 2.05 cm^2. Valve area (Vmax):   1.78 cm^2. Valve area (Vmean): 1.78 cm^2. - Left atrium: The atrium was moderately dilated.  Assessment & Plan    Acute on Chronic Diastolic CHF - Patient was admitted for altered mental status and was found to be in septic shock secondary to secondary to infectious gastroenteritis with bacteremia. Hospital course complicated by diagnosis of Influenza B and acute on chronic CHF. Cardiology was consulted to assist in management given persistent shortness of breath and crackles on exam despite high dose diuretics. - BNP elevated at 984.3. - Most recent chest x-ray on 2/22 showed improving pulmonary edema. - Most recent Echo from 09/2018 showed LVEF of 65-70% with grade 1 diastolic dysfunction. - Echo pending. - Patient currently IV Lasix '60mg'$  daily. Documented output 750 mL in the last 24 hours with net negative 13 L since admission. Renal function stable with serum creatinine of 0.78. - Will continue IV Lasix '60mg'$  twice daily. - Patient on Toprol '25mg'$  daily at home. This was held on admission but could restart this if BP allows.  - Monitor daily weight, strict I/O's, and renal function.  History of CVA - Patient has history of CVA that was felt to be  embolic at that time. TEE showed PFO with right to left shunt. Patient was seen by Dr. Burt Knack at that time for consideration of PFO closure. The plan was for patient to follow-up with Dr. Burt Knack as an outpatient to schedule the procedure; however, the patient never followed up. - Of note, patient has paroxysmal atrial fibrillation listed multiple times in her chart. However, I do not see this documented in any Cardiology notes. Review EKGs going back multiple years. There were a few with a read of atrial fibrillation; however, it looks like it may just be baseline artifact. Regardless, patient is on chronic anticoagulation for presumed arrhythmic cause of stroke. Currently, in sinus rhythm on telemetry.  Elevated Troponin - Troponin mildly elevated an flat on initial presentation at 0.22 >> 0.23 >> 0.20 >> 0.34. Patient denies any chest pain. Last Myoview in 11/2016 showed no evidence of ischemia. Likely demand ischemia in the setting of septic shock. Do no anticipate any further ischemic evaluation pending Echo results.  Otherwise, per primary team.     Signed, Darreld Mclean, PA-C 01/14/2019, 12:46 PM   I have examined the patient and reviewed assessment and plan and discussed with patient.  Agree with above as stated.    Frail woman who looks much older than stated age.  No overt signs of volume overload.  I personally reviewed her chest xray.  THere may be some pulmonary edema.  She is not a candidate for right heart cath due to frailty.  Recs: 1) COntinue IV diuretics, until there is a change in renal function, and as BP allows.  Echo pending.  DOE likely related to deconditioning as well. 2) Palliative care should be considered.  3) WOuld not pursue ischemia eval despite her elevated troponin.  May be more related to diastolic heart failure.   Larae Grooms   For questions or updates, please contact   Please consult [email or web address] for contact info under Cardiology/STEMI.

## 2019-01-15 ENCOUNTER — Inpatient Hospital Stay (HOSPITAL_COMMUNITY): Payer: Medicaid Other

## 2019-01-15 DIAGNOSIS — N390 Urinary tract infection, site not specified: Secondary | ICD-10-CM

## 2019-01-15 DIAGNOSIS — R7881 Bacteremia: Secondary | ICD-10-CM

## 2019-01-15 DIAGNOSIS — G8929 Other chronic pain: Secondary | ICD-10-CM

## 2019-01-15 DIAGNOSIS — I5033 Acute on chronic diastolic (congestive) heart failure: Secondary | ICD-10-CM

## 2019-01-15 DIAGNOSIS — R0602 Shortness of breath: Secondary | ICD-10-CM

## 2019-01-15 LAB — CBC
HCT: 27 % — ABNORMAL LOW (ref 36.0–46.0)
Hemoglobin: 8.7 g/dL — ABNORMAL LOW (ref 12.0–15.0)
MCH: 26.4 pg (ref 26.0–34.0)
MCHC: 32.2 g/dL (ref 30.0–36.0)
MCV: 82.1 fL (ref 80.0–100.0)
Platelets: 370 10*3/uL (ref 150–400)
RBC: 3.29 MIL/uL — ABNORMAL LOW (ref 3.87–5.11)
RDW: 21.2 % — AB (ref 11.5–15.5)
WBC: 12.2 10*3/uL — ABNORMAL HIGH (ref 4.0–10.5)
nRBC: 0.2 % (ref 0.0–0.2)

## 2019-01-15 LAB — BASIC METABOLIC PANEL
Anion gap: 12 (ref 5–15)
BUN: 40 mg/dL — ABNORMAL HIGH (ref 6–20)
CALCIUM: 8.5 mg/dL — AB (ref 8.9–10.3)
CO2: 27 mmol/L (ref 22–32)
Chloride: 100 mmol/L (ref 98–111)
Creatinine, Ser: 1 mg/dL (ref 0.44–1.00)
GFR calc Af Amer: >60 mL/min (ref >60)
GFR calc non Af Amer: >60 mL/min (ref >60)
Glucose, Bld: 65 mg/dL — ABNORMAL LOW (ref 70–99)
Potassium: 3.8 mmol/L (ref 3.5–5.1)
Sodium: 139 mmol/L (ref 135–145)

## 2019-01-15 LAB — GLUCOSE, CAPILLARY
Glucose-Capillary: 134 mg/dL — ABNORMAL HIGH (ref 70–99)
Glucose-Capillary: 109 mg/dL — ABNORMAL HIGH (ref 70–99)
Glucose-Capillary: 123 mg/dL — ABNORMAL HIGH (ref 70–99)
Glucose-Capillary: 172 mg/dL — ABNORMAL HIGH (ref 70–99)

## 2019-01-15 LAB — MAGNESIUM: Magnesium: 1.9 mg/dL (ref 1.7–2.4)

## 2019-01-15 MED ORDER — INSULIN GLARGINE 100 UNIT/ML ~~LOC~~ SOLN
10.0000 [IU] | Freq: Every day | SUBCUTANEOUS | Status: DC
  Administered 2019-01-16: 10 [IU] via SUBCUTANEOUS
  Filled 2019-01-15: qty 0.1

## 2019-01-15 NOTE — Progress Notes (Signed)
Patient ID: Amber [name], female   DOB: [birth year], 63 y.o.   MRN: [id number]  PROGRESS NOTE    Amber [name]  OYD:[id number] DOB: 01/23/1956 DOA: 12/29/2018 PCP: [name], MD   Brief Narrative:  63 year old female with history of chronic diastolic CHF, CVA, PAF on anticoagulation, hypertension, chronic pancreatitis, seizures, rheumatoid arthritis, prior back surgeries, chronic pain syndrome presented with hypotension and was found to have septic shock secondary to gram-negative bacteremia.  She finished antibiotic course for the same.  She subsequently developed influenza while being in the hospital which was also treated.  Patient was also on high doses of narcotics at home and was baseline very somnolent during the hospitalization.  Narcotics have been tapered off and her mentation is better.  She subsequently developed acute on chronic CHF for which she required high doses of diuretics.  Cardiology was also consulted.  Assessment & Plan:   Active Problems:   Septic shock (HCC)   Palliative care encounter  Acute on chronic diastolic CHF -Echo shows EF of 60 to 65% with impaired diastolic relaxation. -Cardiology evaluation appreciated.  Currently on Lasix IV 60 mg every 12 hours.  Got a dose of Zaroxolyn on 01/14/2019.  Continue strict input and output.  Daily weights.  Negative balance of 15,837.5 cc since admission.  Continue nebivolol -Patient still complaining of shortness of breath.  Will get repeat chest x-ray.  Septic shock -Sepsis has resolved.  Patient has completed antibiotic course.  Currently hemodynamically stable  E. coli bacteremia -Patient has completed antibiotic course with Rocephin  VRE UTI -Most likely colonization.  Currently afebrile without leukocytosis. -She was given a dose of oral fosfomycin on 01/08/2019.  Continue to monitor off antibiotics currently  Influenza bronchitis -Patient was treated for hospital-acquired influenza infection with Tamiflu and has  completed course. -Because of her being extremely frail, bedbound almost and poor cough reflex along with history of high-dose narcotic use, she is at high risk for decompensation respiratory wise.  Generalized deconditioning -Patient is very deconditioned and hardly gets out of bed.  She is mostly bed and wheelchair bound and has not ambulated in apparently for 4 years.  PT recommended SNF.  Patient and family members have refused SNF placement.  She will need home health on discharge.  Care management aware -Palliative care has also evaluated the patient.  Patient remains full code for now  Lactic acidosis -Secondary to sepsis.  Resolved  Elevated troponin -Trend was flat.  Most likely from demand ischemia.  Cardiology following  Paroxysmal A. fib -Currently has intermittent tachycardia.  Continue Eliquis.  Continue nebivolol  Leukocytosis -Questionable cause.  Improving.  Monitor  Chronic anemia -Hemoglobin stable.  Required 1 unit of packed red cell transfusion during this hospitalization.  Monitor hemoglobin  History of rheumatoid arthritis with chronic steroid use -Appears to be on chronic prednisone.  Stress dose IV Solu-Cortef has been tapered to home dose of prednisone  Chronic pancreatitis -Continue Creon  History of seizures--continue Depakote  History of CVA -Continue Eliquis.  Chronic pain syndrome -Hold narcotics were gradually tapered and stopped on 01/09/2019.  Patient agreed to stopping narcotics.  Hyperkalemia -Resolved  Diabetes mellitus type 2 with hyperglycemia -Continue Lantus along with sliding scale insulin  DVT prophylaxis: Eliquis Code Status: Full Family Communication: None at bedside Disposition Plan: Home in 1 to 2 days once cleared by cardiology  Consultants: Cardiology/Pulmonary/palliative care  Procedures:  Echo on 01/14/2019 1. The left ventricle has normal systolic function with an ejection fraction of 60-65%.  The cavity size was  normal. There is moderately increased left ventricular wall thickness. Left ventricular diastolic Doppler parameters are consistent with impaired  relaxation No evidence of left ventricular regional wall motion abnormalities.  2. The right ventricle has normal systolic function. The cavity was normal. There is no increase in right ventricular wall thickness.  3. Trivial pericardial effusion is present.  4. The mitral valve is normal in structure. There is mild to moderate mitral annular calcification present. No evidence of mitral valve stenosis. No significant mitral regurgitation.  5. The tricuspid valve is normal in structure.  6. The aortic valve is tricuspid Mild calcification of the aortic valve. no stenosis of the aortic valve.  7. The aortic root and ascending aorta are normal in size and structure.  8. IVC not visualized. Peak RV-RA gradient 30 mmHg.  Antimicrobials:  Anti-infectives (From admission, onward)   Start     Dose/Rate Route Frequency Ordered Stop   01/08/19 1345  sulfamethoxazole-trimethoprim (BACTRIM DS,SEPTRA DS) 800-160 MG per tablet 1 tablet     1 tablet Oral Every 12 hours 01/08/19 1334 01/09/19 2313   01/05/19 1000  oseltamivir (TAMIFLU) capsule 30 mg     30 mg Oral 2 times daily 01/05/19 0826 01/09/19 2312   01/04/19 1500  cefTRIAXone (ROCEPHIN) 2 g in sodium chloride 0.9 % 100 mL IVPB  Status:  Discontinued     2 g 200 mL/hr over 30 Minutes Intravenous Every 24 hours 01/04/19 1416 01/07/19 1049   01/04/19 1500  metroNIDAZOLE (FLAGYL) IVPB 500 mg  Status:  Discontinued     500 mg 100 mL/hr over 60 Minutes Intravenous Every 8 hours 01/04/19 1416 01/07/19 1049   01/03/19 1030  sulfamethoxazole-trimethoprim (BACTRIM DS,SEPTRA DS) 800-160 MG per tablet 1 tablet  Status:  Discontinued     1 tablet Oral Every 12 hours 01/03/19 1019 01/04/19 1416   01/03/19 0000  sulfamethoxazole-trimethoprim (BACTRIM DS,SEPTRA DS) 800-160 MG tablet     1 tablet Oral Every 12 hours  01/03/19 1059     12/31/18 1330  vancomycin (VANCOCIN) IVPB 750 mg/150 ml premix  Status:  Discontinued     750 mg 150 mL/hr over 60 Minutes Intravenous Every 48 hours 12/29/18 1738 12/30/18 1127   12/30/18 1200  cefTRIAXone (ROCEPHIN) 2 g in sodium chloride 0.9 % 100 mL IVPB  Status:  Discontinued     2 g 200 mL/hr over 30 Minutes Intravenous Every 24 hours 12/30/18 1127 01/03/19 1019   12/30/18 0130  ceFEPIme (MAXIPIME) 1 g in sodium chloride 0.9 % 100 mL IVPB  Status:  Discontinued     1 g 200 mL/hr over 30 Minutes Intravenous Every 12 hours 12/29/18 1750 12/30/18 1127   12/29/18 1715  ceFEPIme (MAXIPIME) 2 g in sodium chloride 0.9 % 100 mL IVPB  Status:  Discontinued     2 g 200 mL/hr over 30 Minutes Intravenous  Once 12/29/18 1706 12/29/18 1750   12/29/18 1715  metroNIDAZOLE (FLAGYL) IVPB 500 mg  Status:  Discontinued     500 mg 100 mL/hr over 60 Minutes Intravenous Every 8 hours 12/29/18 1706 12/30/18 1127   12/29/18 1715  vancomycin (VANCOCIN) IVPB 1000 mg/200 mL premix  Status:  Discontinued     1,000 mg 200 mL/hr over 60 Minutes Intravenous  Once 12/29/18 1706 12/29/18 1750   12/29/18 1230  ceFEPIme (MAXIPIME) 2 g in sodium chloride 0.9 % 100 mL IVPB     2 g 200 mL/hr over 30 Minutes Intravenous  Once  12/29/18 1226 12/29/18 1424   12/29/18 1230  metroNIDAZOLE (FLAGYL) IVPB 500 mg  Status:  Discontinued     500 mg 100 mL/hr over 60 Minutes Intravenous Every 8 hours 12/29/18 1226 12/29/18 1706   12/29/18 1230  vancomycin (VANCOCIN) IVPB 1000 mg/200 mL premix     1,000 mg 200 mL/hr over 60 Minutes Intravenous  Once 12/29/18 1226 12/29/18 1551       Subjective: Patient seen and examined at bedside.  She states that she does not feel well and has heart time breathing.  When I asked her if she is ready to go home, she states that she is ready to go home.  No overnight fever or vomiting.  Objective: Vitals:   01/14/19 2009 01/15/19 0500 01/15/19 0649 01/15/19 0845  BP: 94/65   104/73 95/73  Pulse: (!) 109  (!) 103 (!) 105  Resp: (!) 31  (!) 29 (!) 21  Temp: 98.6 F (37 C)  98.9 F (37.2 C)   TempSrc: Oral  Oral   SpO2: 100%  99% 99%  Weight:  38.3 kg    Height:        Intake/Output Summary (Last 24 hours) at 01/15/2019 1417 Last data filed at 01/15/2019 1217 Gross per 24 hour  Intake 1440 ml  Output 2200 ml  Net -760 ml   Filed Weights   12/29/18 1229 12/29/18 2040 01/15/19 0500  Weight: 40.5 kg 50.1 kg 38.3 kg    Examination:  General exam: Appears very thinly built.  Looks chronically ill.  No acute distress Respiratory system: Bilateral decreased breath sounds at bases with basilar crackles, intermittent tachypnea Cardiovascular system: S1 & S2 heard, tachycardic  gastrointestinal system: Abdomen is nondistended, soft and nontender. Normal bowel sounds heard. Extremities: No cyanosis, clubbing; lower extremity trace edema       Data Reviewed: I have personally reviewed following labs and imaging studies  CBC: Recent Labs  Lab 01/10/19 0741 01/11/19 0416 01/15/19 0612  WBC 6.9 14.5* 12.2*  HGB 8.3* 8.2* 8.7*  HCT 24.5* 24.6* 27.0*  MCV 76.1* 76.9* 82.1  PLT 187 217 370   Basic Metabolic Panel: Recent Labs  Lab 01/09/19 0432 01/10/19 0741 01/11/19 0416 01/12/19 0547 01/13/19 0554 01/14/19 0555 01/15/19 0612  NA 134* 133* 134* 131*  --  136 139  K 4.5 3.3* 3.3* 5.2* 5.7* 4.8 3.8  CL 98 89* 94* 95*  --  101 100  CO2 27 33* 30 26  --  26 27  GLUCOSE 68* 267* 104* 313*  --  106* 65*  BUN 7 12 16  21*  --  36* 40*  CREATININE 0.91 1.22* 1.07* 0.97  --  0.87 1.00  CALCIUM 7.9* 7.5* 7.7* 8.0*  --  8.1* 8.5*  MG 1.7 1.5* 2.2 2.0  --   --  1.9   GFR: Estimated Creatinine Clearance: 36.2 mL/min (by C-G formula based on SCr of 1 mg/dL). Liver Function Tests: No results for input(s): AST, ALT, ALKPHOS, BILITOT, PROT, ALBUMIN in the last 168 hours. No results for input(s): LIPASE, AMYLASE in the last 168 hours. No results for  input(s): AMMONIA in the last 168 hours. Coagulation Profile: No results for input(s): INR, PROTIME in the last 168 hours. Cardiac Enzymes: No results for input(s): CKTOTAL, CKMB, CKMBINDEX, TROPONINI in the last 168 hours. BNP (last 3 results) No results for input(s): PROBNP in the last 8760 hours. HbA1C: No results for input(s): HGBA1C in the last 72 hours. CBG: Recent Labs  Lab  01/14/19 1139 01/14/19 1616 01/14/19 2014 01/15/19 0823 01/15/19 1200  GLUCAP 227* 130* 139* 134* 109*   Lipid Profile: No results for input(s): CHOL, HDL, LDLCALC, TRIG, CHOLHDL, LDLDIRECT in the last 72 hours. Thyroid Function Tests: No results for input(s): TSH, T4TOTAL, FREET4, T3FREE, THYROIDAB in the last 72 hours. Anemia Panel: No results for input(s): VITAMINB12, FOLATE, FERRITIN, TIBC, IRON, RETICCTPCT in the last 72 hours. Sepsis Labs: No results for input(s): PROCALCITON, LATICACIDVEN in the last 168 hours.  No results found for this or any previous visit (from the past 240 hour(s)).       Radiology Studies: No results found.      Scheduled Meds: . allopurinol  300 mg Oral Daily  . apixaban  2.5 mg Oral BID  . feeding supplement (GLUCERNA SHAKE)  237 mL Oral TID BM  . furosemide  60 mg Intravenous BID  . insulin aspart  0-5 Units Subcutaneous QHS  . insulin aspart  0-9 Units Subcutaneous TID WC  . insulin glargine  15 Units Subcutaneous Daily  . lipase/protease/amylase  36,000 Units Oral TID AC  . mouth rinse  15 mL Mouth Rinse BID  . nebivolol  2.5 mg Oral Daily  . pantoprazole  40 mg Oral BID  . predniSONE  5 mg Oral Q breakfast  . valproic acid  250 mg Oral TID   Continuous Infusions:   LOS: 17 days        Glade Lloyd, MD [facility] 01/15/2019, 2:17 PM

## 2019-01-15 NOTE — Plan of Care (Signed)
In progess to be discharge

## 2019-01-15 NOTE — Progress Notes (Signed)
Progress Note  Patient Name: Amber [name] Date of Encounter: 01/15/2019  Primary Cardiologist: Dr. Excell Seltzer  Subjective   Still feels [facility]. Is not enjoying her lunch... describes her food as "nasty."  Inpatient Medications    Scheduled Meds: . allopurinol  300 mg Oral Daily  . apixaban  2.5 mg Oral BID  . feeding supplement (GLUCERNA SHAKE)  237 mL Oral TID BM  . furosemide  60 mg Intravenous BID  . insulin aspart  0-5 Units Subcutaneous QHS  . insulin aspart  0-9 Units Subcutaneous TID WC  . [START ON 01/16/2019] insulin glargine  10 Units Subcutaneous Daily  . lipase/protease/amylase  36,000 Units Oral TID AC  . mouth rinse  15 mL Mouth Rinse BID  . nebivolol  2.5 mg Oral Daily  . pantoprazole  40 mg Oral BID  . predniSONE  5 mg Oral Q breakfast  . valproic acid  250 mg Oral TID   Continuous Infusions:  PRN Meds: acetaminophen, dextrose, diltiazem, hydrOXYzine, ipratropium-albuterol, naLOXone (NARCAN)  injection, simethicone   Vital Signs    Vitals:   01/15/19 0649 01/15/19 0845 01/15/19 2057 01/15/19 2125  BP: 104/73 95/73  (!) 85/55  Pulse: (!) 103 (!) 105  (!) 114  Resp: (!) 29 (!) 21  17  Temp: 98.9 F (37.2 C)   97.6 F (36.4 C)  TempSrc: Oral   Oral  SpO2: 99% 99% 97% (!) 77%  Weight:      Height:        Intake/Output Summary (Last 24 hours) at 01/15/2019 2345 Last data filed at 01/15/2019 1217 Gross per 24 hour  Intake 720 ml  Output 1500 ml  Net -780 ml   Last 3 Weights 01/15/2019 12/29/2018 12/29/2018  Weight (lbs) 84 lb 7 oz 110 lb 7.2 oz 89 lb 4.6 oz  Weight (kg) 38.3 kg 50.1 kg 40.5 kg      Telemetry    NSR - Personally Reviewed  ECG      Physical Exam   GEN: No acute distress.  Looks better today compared to yesterday Neck: No JVD Cardiac: RRR, no murmurs, rubs, or gallops.  Respiratory: Coarse breath sounds to auscultation bilaterally. GI: Soft, nontender, non-distended  MS: No edema; No deformity. Neuro:  Nonfocal  Psych:  Normal affect   Labs    Chemistry Recent Labs  Lab 01/12/19 0547 01/13/19 0554 01/14/19 0555 01/15/19 0612  NA 131*  --  136 139  K 5.2* 5.7* 4.8 3.8  CL 95*  --  101 100  CO2 26  --  26 27  GLUCOSE 313*  --  106* 65*  BUN 21*  --  36* 40*  CREATININE 0.97  --  0.87 1.00  CALCIUM 8.0*  --  8.1* 8.5*  GFRNONAA >60  --  >60 >60  GFRAA >60  --  >60 >60  ANIONGAP 10  --  9 12     Hematology Recent Labs  Lab 01/10/19 0741 01/11/19 0416 01/15/19 0612  WBC 6.9 14.5* 12.2*  RBC 3.22* 3.20* 3.29*  HGB 8.3* 8.2* 8.7*  HCT 24.5* 24.6* 27.0*  MCV 76.1* 76.9* 82.1  MCH 25.8* 25.6* 26.4  MCHC 33.9 33.3 32.2  RDW 18.8* 19.9* 21.2*  PLT 187 217 370    Cardiac EnzymesNo results for input(s): TROPONINI in the last 168 hours. No results for input(s): TROPIPOC in the last 168 hours.   BNPNo results for input(s): BNP, PROBNP in the last 168 hours.   DDimer No results  for input(s): DDIMER in the last 168 hours.   Radiology    Dg Chest 2 View  Result Date: 01/15/2019 CLINICAL DATA:  Dyspnea. EXAM: CHEST - 2 VIEW COMPARISON:  01/09/2019 FINDINGS: Stably enlarged cardiac silhouette. Mediastinal contours appear intact. There is no evidence of pleural effusion or pneumothorax. Improved but not completely resolved linear peribronchial opacities throughout both lungs. More nodular opacity seen in the mid right lung field. Osseous structures are without acute abnormality. Stable spinal fusion. Soft tissues are grossly normal. IMPRESSION: 1. Improved but not completely resolved linear peribronchial opacities throughout both lungs. 2. More nodular opacity in the mid right lung field. This may represent an area of airspace consolidation, however pulmonary nodule cannot be excluded. Follow-up to resolution after empiric treatment is recommended. Electronically Signed   By: Ted Mcalpine M.D.   On: 01/15/2019 15:30    Cardiac Studies   Normal LVEF, PFO on prior echo  Patient Profile       63 y.o. female with diastolic heart failure  Assessment & Plan    COntinue diuresis.  Cr stable.  WOuld diurese until her Cr bumps.  Difficult to assess volume status.    She is quite frail, but appears to be improving.      For questions or updates, please contact [facility] Please consult [email or web address] for contact info under        Signed, Lance Muss, MD  01/15/2019, 11:45 PM

## 2019-01-15 NOTE — Progress Notes (Signed)
Physical Therapy Treatment Patient Details Name: Amber [name] MRN: [id number] DOB: 10-04-1956 Today's Date: 01/15/2019    History of Present Illness 63 y.o. female admitted 12/29/18 for hypotension with recent admission to [facility] for food poisoning and electrolyte abnormalities (d/c home on 12/28/18).  Pt came to ED not feeling well and found to be hypotensive.  PT dx with septic shock due to E coli gastroenteritis with bacteremia, lactic acidosis, hypokalemia, elevated troponin (suspected demand ischemia), mild AKI, and multiple episodes of hypoglycemia (h/o DM and poor po intake).  Pt with other significant PMH of HTN, embolic stroke, focal seizure, ACS, frequent falls and back surgery.     PT Comments    Goals updated.  Pt remains weak and fearful of falling, however, she is strong enough that she could attempt to progress gait safely with assistance.  Family is refusing SNF placement for her, so frequency updated and max HH services recommended.  PT will continue to follow acutely for safe mobility progression  Follow Up Recommendations  SNF;Other (comment)(family refusing, so max HH services)     Equipment Recommendations  Hospital bed    Recommendations for Other Services   NA     Precautions / Restrictions Precautions Precautions: Fall    Mobility  Bed Mobility Overal bed mobility: Needs Assistance Bed Mobility: Rolling;Supine to Sit;Sit to Supine Rolling: Min assist   Supine to sit: Min assist Sit to supine: Mod assist   General bed mobility comments: Min assist to roll bil, come up to sitting EOB from [facility] elevated ~45 degrees.  Mod assist to help lift both legs back into bed to return to supine.   Transfers Overall transfer level: Needs assistance Equipment used: Rolling walker (2 wheeled) Transfers: Sit to/from Stand Sit to Stand: Mod assist         General transfer comment: Mod assist to stand from bed with feet blocked to prevent anterior  sliding.    Ambulation/Gait Ambulation/Gait assistance: Mod assist Gait Distance (Feet): 2 Feet Assistive device: Rolling walker (2 wheeled) Gait Pattern/deviations: Step-to pattern     General Gait Details: Attempted to side step EOB with pt, very fearful of falling and unable to take more than one step with me today.           Balance Overall balance assessment: Needs assistance Sitting-balance support: Feet supported;Bilateral upper extremity supported Sitting balance-Leahy Scale: Fair Sitting balance - Comments: close supervision EOB.  Postural control: Posterior lean Standing balance support: Bilateral upper extremity supported Standing balance-Leahy Scale: Poor Standing balance comment: needs external support from therapist and RW, mod assist                             Cognition Arousal/Alertness: Awake/alert Behavior During Therapy: WFL for tasks assessed/performed Overall Cognitive Status: Impaired/Different from baseline Area of Impairment: Memory                     Memory: Decreased short-term memory Following Commands: Follows one step commands consistently       General Comments: Pt did not remember she ate lunch .          General Comments General comments (skin integrity, edema, etc.): Pt is generally weak with signs of muscle wasting.  Able to move ankle at least 3-/5, knee 3-/5, hip flexion supine in bed 2+/5.  Arms are weak and unable to lift >90 degrees left, right mildly better (her dominant arm).  Pertinent Vitals/Pain Pain Assessment: Faces Faces Pain Scale: Hurts whole lot Pain Location: back Pain Descriptors / Indicators: Guarding;Grimacing Pain Intervention(s): Limited activity within patient's tolerance;Monitored during session;Repositioned;Patient requesting pain meds-RN notified           PT Goals (current goals can now be found in the care plan section) Acute Rehab PT Goals Patient Stated Goal: not to be  sacared of falling PT Goal Formulation: With patient Time For Goal Achievement: 01/29/19 Potential to Achieve Goals: Fair Progress towards PT goals: Progressing toward goals(goals are due to be updated)    Frequency    Min 3X/week      PT Plan Frequency needs to be updated;Other (comment)(increased freq as pt's family refusing SNF)       AM-PAC PT "6 Clicks" Mobility   Outcome Measure  Help needed turning from your back to your side while in a flat bed without using bedrails?: A Little Help needed moving from lying on your back to sitting on the side of a flat bed without using bedrails?: A Little Help needed moving to and from a bed to a chair (including a wheelchair)?: A Lot Help needed standing up from a chair using your arms (e.g., wheelchair or bedside chair)?: A Lot Help needed to walk in hospital room?: A Lot Help needed climbing 3-5 steps with a railing? : Total 6 Click Score: 13    End of Session Equipment Utilized During Treatment: Gait belt;Oxygen Activity Tolerance: Patient limited by pain Patient left: in bed;with call bell/phone within reach;with bed alarm set Nurse Communication: Mobility status PT Visit Diagnosis: Unsteadiness on feet (R26.81);Muscle weakness (generalized) (M62.81);Difficulty in walking, not elsewhere classified (R26.2);Pain Pain - Right/Left: (upper) Pain - part of body: (back)     Time: 1346-1410 PT Time Calculation (min) (ACUTE ONLY): 24 min  Charges:  $Therapeutic Activity: 23-37 mins                    Allani Reber B. Aoki Wedemeyer, PT, DPT  Acute Rehabilitation [phone] pager #[phone] office   01/15/2019, 2:25 PM

## 2019-01-16 LAB — CBC WITH DIFFERENTIAL/PLATELET
Abs Immature Granulocytes: 0.51 10*3/uL — ABNORMAL HIGH (ref 0.00–0.07)
Basophils Absolute: 0 10*3/uL (ref 0.0–0.1)
Basophils Relative: 0 %
Eosinophils Absolute: 0.2 10*3/uL (ref 0.0–0.5)
Eosinophils Relative: 2 %
HEMATOCRIT: 26.6 % — AB (ref 36.0–46.0)
Hemoglobin: 8.4 g/dL — ABNORMAL LOW (ref 12.0–15.0)
Immature Granulocytes: 5 %
Lymphocytes Relative: 34 %
Lymphs Abs: 3.6 10*3/uL (ref 0.7–4.0)
MCH: 26.8 pg (ref 26.0–34.0)
MCHC: 31.6 g/dL (ref 30.0–36.0)
MCV: 85 fL (ref 80.0–100.0)
MONO ABS: 0.9 10*3/uL (ref 0.1–1.0)
Monocytes Relative: 8 %
Neutro Abs: 5.3 10*3/uL (ref 1.7–7.7)
Neutrophils Relative %: 51 %
Platelets: 382 10*3/uL (ref 150–400)
RBC: 3.13 MIL/uL — ABNORMAL LOW (ref 3.87–5.11)
RDW: 21 % — ABNORMAL HIGH (ref 11.5–15.5)
WBC: 10.6 10*3/uL — ABNORMAL HIGH (ref 4.0–10.5)
nRBC: 0 % (ref 0.0–0.2)

## 2019-01-16 LAB — GLUCOSE, CAPILLARY
GLUCOSE-CAPILLARY: 192 mg/dL — AB (ref 70–99)
Glucose-Capillary: 96 mg/dL (ref 70–99)

## 2019-01-16 LAB — BASIC METABOLIC PANEL
Anion gap: 11 (ref 5–15)
BUN: 50 mg/dL — ABNORMAL HIGH (ref 6–20)
CO2: 25 mmol/L (ref 22–32)
Calcium: 8.7 mg/dL — ABNORMAL LOW (ref 8.9–10.3)
Chloride: 104 mmol/L (ref 98–111)
Creatinine, Ser: 1.21 mg/dL — ABNORMAL HIGH (ref 0.44–1.00)
GFR calc Af Amer: 56 mL/min — ABNORMAL LOW (ref >60)
GFR calc non Af Amer: 49 mL/min — ABNORMAL LOW (ref >60)
Glucose, Bld: 106 mg/dL — ABNORMAL HIGH (ref 70–99)
Potassium: 4.2 mmol/L (ref 3.5–5.1)
Sodium: 140 mmol/L (ref 135–145)

## 2019-01-16 LAB — MAGNESIUM: MAGNESIUM: 1.8 mg/dL (ref 1.7–2.4)

## 2019-01-16 MED ORDER — ALBUTEROL SULFATE HFA 108 (90 BASE) MCG/ACT IN AERS: 2.0000 | INHALATION_SPRAY | RESPIRATORY_TRACT | 0 refills | Status: AC | PRN

## 2019-01-16 MED ORDER — FUROSEMIDE 40 MG PO TABS: 40.0000 mg | ORAL_TABLET | Freq: Every day | ORAL | 11 refills | Status: DC

## 2019-01-16 MED ORDER — PREDNISONE 5 MG PO TABS: 5.0000 mg | ORAL_TABLET | Freq: Every day | ORAL | 0 refills | Status: AC

## 2019-01-16 MED ORDER — FUROSEMIDE 40 MG PO TABS: 40.0000 mg | ORAL_TABLET | Freq: Two times a day (BID) | ORAL | 0 refills | Status: DC

## 2019-01-16 MED ORDER — DIPHENHYDRAMINE-ZINC ACETATE 2-0.1 % EX CREA
TOPICAL_CREAM | Freq: Three times a day (TID) | CUTANEOUS | Status: DC | PRN
  Filled 2019-01-16: qty 28

## 2019-01-16 NOTE — Progress Notes (Signed)
SATURATION QUALIFICATIONS: (This note is used to comply with regulatory documentation for home oxygen)  Patient Saturations on Room Air at Rest =100%  Patient Saturations on Room Air while Ambulating =N/A Patient is bed bound

## 2019-01-16 NOTE — Progress Notes (Signed)
Physical Therapy Treatment Patient Details Name: Amber [name] MRN: [id number] DOB: 25-Mar-1956 Today's Date: 01/16/2019    History of Present Illness 63 y.o. female admitted 12/29/18 for hypotension with recent admission to [facility] for food poisoning and electrolyte abnormalities (d/c home on 12/28/18).  Pt came to ED not feeling well and found to be hypotensive.  PT dx with septic shock due to E coli gastroenteritis with bacteremia, lactic acidosis, hypokalemia, elevated troponin (suspected demand ischemia), mild AKI, and multiple episodes of hypoglycemia (h/o DM and poor po intake).  Pt with other significant PMH of HTN, embolic stroke, focal seizure, ACS, frequent falls and back surgery.     PT Comments    Pt presented in bed with son present in room. Pt awaiting d/c and declined gait, but agreed to bed therex and transfer to pushed w/c to get ready for d/c. Pt tolerated therex interventions, but continues to be very limited by pain and discomfort. Pt family expressed comfort and understanding with tranfers. Family present for EOB to w/c transfer and education was given on different options to safely complete transfer. Pt family refusing SNF placement and HHPT to follow with progression plan to be determined by aforementioned agency. Pt left in room with son present and in pushed w/c awaiting discharge paperwork review by nursing. Son provided with HEP and he verbalized understanding.  Follow Up Recommendations  SNF;Other (comment)( (family refusing, so max HH services))     Equipment Recommendations  Hospital bed    Recommendations for Other Services       Precautions / Restrictions Precautions Precautions: Fall Restrictions Weight Bearing Restrictions: No    Mobility  Bed Mobility Overal bed mobility: Needs Assistance Bed Mobility: Rolling;Sidelying to Sit Rolling: Min assist Sidelying to sit: Mod assist       General bed mobility comments: Pt presented  with stool incontinence and was able to roll from supine to sidelying back anf forth to assist with cleaning and donning of clothing.   Transfers Overall transfer level: Needs assistance Equipment used: 2 person hand held assist;Pushed w/c Transfers: Squat Pivot Transfers     Squat pivot transfers: Max assist     General transfer comment: Max assist with squat pivot from EOB to pushed transport w/c.  Ambulation/Gait             General Gait Details: Declined d/t approaching d/c   Stairs             Wheelchair Mobility    Modified Rankin (Stroke Patients Only)       Balance Overall balance assessment: Needs assistance Sitting-balance support: Feet supported;Bilateral upper extremity supported Sitting balance-Leahy Scale: Fair Sitting balance - Comments: close supervision EOB.  Postural control: Posterior lean Standing balance support: Bilateral upper extremity supported Standing balance-Leahy Scale: Poor Standing balance comment: needs external support from therapist and RW, mod assist                             Cognition Arousal/Alertness: Awake/alert Behavior During Therapy: WFL for tasks assessed/performed Overall Cognitive Status: Impaired/Different from baseline                                        Exercises General Exercises - Lower Extremity Ankle Circles/Pumps: AROM;10 reps;Supine;Both Quad Sets: Both;Supine;AROM;10 reps Heel Slides: AROM;10 reps;Supine;Both Hip ABduction/ADduction: PROM;10 reps;Both;Supine  General Comments        Pertinent Vitals/Pain Faces Pain Scale: Hurts whole lot Pain Location: back Pain Descriptors / Indicators: Guarding;Grimacing;Moaning    Home Living                      Prior Function            PT Goals (current goals can now be found in the care plan section) Acute Rehab PT Goals Patient Stated Goal: to go home PT Goal Formulation: With  patient/family Potential to Achieve Goals: Fair    Frequency    Min 3X/week      PT Plan Current plan remains appropriate    Co-evaluation              AM-PAC PT "6 Clicks" Mobility   Outcome Measure  Help needed turning from your back to your side while in a flat bed without using bedrails?: A Little Help needed moving from lying on your back to sitting on the side of a flat bed without using bedrails?: A Little Help needed moving to and from a bed to a chair (including a wheelchair)?: A Lot Help needed standing up from a chair using your arms (e.g., wheelchair or bedside chair)?: A Lot Help needed to walk in hospital room?: A Lot Help needed climbing 3-5 steps with a railing? : Total 6 Click Score: 13    End of Session Equipment Utilized During Treatment: Oxygen Activity Tolerance: Patient limited by pain Patient left: in chair;with family/visitor present(Nurse coming back in to go over discharge paperwork) Nurse Communication: Mobility status PT Visit Diagnosis: Unsteadiness on feet (R26.81);Muscle weakness (generalized) (M62.81);Difficulty in walking, not elsewhere classified (R26.2);Pain     Time: 8882-8003 PT Time Calculation (min) (ACUTE ONLY): 24 min  Charges:  $Therapeutic Exercise: 8-22 mins $Therapeutic Activity: 8-22 mins                     Margarita Mail, SPTA   Margarita Mail 01/16/2019, 2:54 PM

## 2019-01-16 NOTE — Progress Notes (Signed)
Amber [name] to be D/C'd home per MD order. Discussed with the patient and all questions fully answered.   VVS, Skin clean, dry and intact without evidence of skin break down, no evidence of skin tears noted.  IV catheter discontinued intact. Site without signs and symptoms of complications. Dressing and pressure applied.  An After Visit Summary was printed and given to the patient.  Patient escorted via WC, and D/C home via private auto.  [name]  01/16/2019 2:00 PM

## 2019-01-16 NOTE — Progress Notes (Addendum)
Progress Note  Patient Name: Amber [name] Date of Encounter: 01/16/2019  Primary Cardiologist: No primary care provider on file.   Subjective   Still complains of SHOB and itching on her back.  Inpatient Medications    Scheduled Meds: . allopurinol  300 mg Oral Daily  . apixaban  2.5 mg Oral BID  . feeding supplement (GLUCERNA SHAKE)  237 mL Oral TID BM  . furosemide  60 mg Intravenous BID  . insulin aspart  0-5 Units Subcutaneous QHS  . insulin aspart  0-9 Units Subcutaneous TID WC  . insulin glargine  10 Units Subcutaneous Daily  . lipase/protease/amylase  36,000 Units Oral TID AC  . mouth rinse  15 mL Mouth Rinse BID  . nebivolol  2.5 mg Oral Daily  . pantoprazole  40 mg Oral BID  . predniSONE  5 mg Oral Q breakfast  . valproic acid  250 mg Oral TID   Continuous Infusions:  PRN Meds: acetaminophen, dextrose, diltiazem, diphenhydrAMINE-zinc acetate, hydrOXYzine, ipratropium-albuterol, naLOXone (NARCAN)  injection, simethicone   Vital Signs    Vitals:   01/15/19 0845 01/15/19 2057 01/15/19 2125 01/16/19 0312  BP: 95/73  (!) 85/55   Pulse: (!) 105  (!) 114   Resp: (!) 21  17   Temp:   97.6 F (36.4 C)   TempSrc:   Oral   SpO2: 99% 97% (!) 77%   Weight:    39.9 kg  Height:        Intake/Output Summary (Last 24 hours) at 01/16/2019 1218 Last data filed at 01/16/2019 1037 Gross per 24 hour  Intake 220 ml  Output 600 ml  Net -380 ml   Last 3 Weights 01/16/2019 01/15/2019 12/29/2018  Weight (lbs) 87 lb 15.4 oz 84 lb 7 oz 110 lb 7.2 oz  Weight (kg) 39.9 kg 38.3 kg 50.1 kg      Telemetry    NSR - Personally Reviewed  ECG      Physical Exam   GEN: No acute distress.   Neck: No JVD Cardiac: RRR, no murmurs, rubs, or gallops.  Respiratory: Clear to auscultation bilaterally. GI: Soft, nontender, non-distended  MS: No edema; No deformity. Neuro:  Nonfocal  Psych: Normal affect   Labs    Chemistry Recent Labs  Lab 01/14/19 0555 01/15/19 0612  01/16/19 0459  NA 136 139 140  K 4.8 3.8 4.2  CL 101 100 104  CO2 26 27 25   GLUCOSE 106* 65* 106*  BUN 36* 40* 50*  CREATININE 0.87 1.00 1.21*  CALCIUM 8.1* 8.5* 8.7*  GFRNONAA >60 >60 49*  GFRAA >60 >60 56*  ANIONGAP 9 12 11      Hematology Recent Labs  Lab 01/11/19 0416 01/15/19 0612 01/16/19 0459  WBC 14.5* 12.2* 10.6*  RBC 3.20* 3.29* 3.13*  HGB 8.2* 8.7* 8.4*  HCT 24.6* 27.0* 26.6*  MCV 76.9* 82.1 85.0  MCH 25.6* 26.4 26.8  MCHC 33.3 32.2 31.6  RDW 19.9* 21.2* 21.0*  PLT 217 370 382    Cardiac EnzymesNo results for input(s): TROPONINI in the last 168 hours. No results for input(s): TROPIPOC in the last 168 hours.   BNPNo results for input(s): BNP, PROBNP in the last 168 hours.   DDimer No results for input(s): DDIMER in the last 168 hours.   Radiology    Dg Chest 2 View  Result Date: 01/15/2019 CLINICAL DATA:  Dyspnea. EXAM: CHEST - 2 VIEW COMPARISON:  01/09/2019 FINDINGS: Stably enlarged cardiac silhouette. Mediastinal contours appear intact. There  is no evidence of pleural effusion or pneumothorax. Improved but not completely resolved linear peribronchial opacities throughout both lungs. More nodular opacity seen in the mid right lung field. Osseous structures are without acute abnormality. Stable spinal fusion. Soft tissues are grossly normal. IMPRESSION: 1. Improved but not completely resolved linear peribronchial opacities throughout both lungs. 2. More nodular opacity in the mid right lung field. This may represent an area of airspace consolidation, however pulmonary nodule cannot be excluded. Follow-up to resolution after empiric treatment is recommended. Electronically Signed   By: Ted Mcalpine M.D.   On: 01/15/2019 15:30    Cardiac Studies     Patient Profile     63 y.o. female   Assessment & Plan    Acute on chronic diastolic heart failure:  COntinue aggressive diuresis.  Lasix 40 mg PO BID reasonable.  SHe will need f/u check of  electrolytes.   [facility] will sign off.   Medication Recommendations:  As above Other recommendations (labs, testing, etc):  As above Follow up as an outpatient:  To be arranged  For questions or updates, please contact [facility] Please consult [email or web address] for contact info under        Signed, Lance Muss, MD  01/16/2019, 12:18 PM

## 2019-01-16 NOTE — Plan of Care (Signed)
  Problem: Education: Goal: Knowledge of General Education information will improve Description Including pain rating scale, medication(s)/side effects and non-pharmacologic comfort measures Outcome: Progressing Note:  POC reviewed with pt.   

## 2019-01-16 NOTE — Discharge Summary (Addendum)
Physician Discharge Summary  Amber [name] JAS:[id number] DOB: 12-22-1955 DOA: 12/29/2018  PCP: Sandi Mariscal, MD  Admit date: 12/29/2018 Discharge date: 01/16/2019  Admitted From: Home Disposition:  Home  Recommendations for Outpatient Follow-up:  1. Follow up with PCP in 1 week with CBC/BMP 2. Follow-up with cardiology as an outpatient 3. Compliant with medications and follow-up 4. Follow-up with outpatient palliative care 5. Follow up in ED if symptoms worsen or new appear   Home Health:  PT/OT/RN/Speech Therapy, Nurse's Aide, Social Work Equipment/Devices: None  Discharge Condition: Guarded to poor CODE STATUS: Full Diet recommendation: Heart healthy/carb modified/fluid restriction up to 1200 cc a day  Brief/Interim Summary: 63 year old female with history of chronic diastolic CHF, CVA, PAF on anticoagulation, hypertension, chronic pancreatitis, seizures, rheumatoid arthritis, prior back surgeries, chronic pain syndrome presented with hypotension and was found to have septic shock secondary to gram-negative bacteremia.  She finished antibiotic course for the same.  She subsequently developed influenza while being in the hospital which was also treated.  Patient was also on high doses of narcotics at home and was baseline very somnolent during the hospitalization.  Narcotics have been tapered off and her mentation is better.  She subsequently developed acute on chronic CHF for which she required high doses of diuretics.  Cardiology was also consulted.  Cardiology is okay for the patient to be discharged on oral Lasix.  Outpatient follow-up with cardiology.  Patient is very deconditioned and refused SNF placement.  She will need home health on discharge and will benefit from outpatient evaluation and follow-up by palliative care.  Discharge Diagnoses:  Active Problems:   Septic shock ([place])   Palliative care encounter  Acute on chronic diastolic CHF -Echo shows EF of 60 to 65% with impaired  diastolic relaxation. -Cardiology evaluation appreciated.    Treated with high doses of IV Lasix and also got intermittent Zaroxolyn. Negative balance of 13,367.5 cc since admission.   -Chest x-ray from 01/15/2019 showed improvement in pulmonary edema; nodular opacity in the right midlung.  Patient has already completed antibiotic treatment.  Will need outpatient imaging study to rule out pulmonary nodule. -Cardiology is okay for the patient to be discharged on oral Lasix.  Will discharge on Lasix 40 mg twice a day.  We will switch her back to long-acting metoprolol.  Outpatient follow-up with cardiology.  Comply with diet and fluid restriction.  Comply with follow-up.  Septic shock -Sepsis has resolved.  Patient has completed antibiotic course.  Currently hemodynamically stable  E. coli bacteremia -Patient has completed antibiotic course with Rocephin  VRE UTI -Most likely colonization.  Currently afebrile without leukocytosis. -She was given a dose of oral fosfomycin on 01/08/2019.  Continue to monitor off antibiotics currently  Influenza bronchitis -Patient was treated for hospital-acquired influenza infection with Tamiflu and has completed course. -Because of her being extremely frail, bedbound almost and poor cough reflex along with history of high-dose narcotic use, she is at high risk for decompensation respiratory wise.  Generalized deconditioning -Patient is very deconditioned and hardly gets out of bed.  She is mostly bed and wheelchair bound and has not ambulated in apparently for 4 years.  PT recommended SNF.  Patient and family members have refused SNF placement.  She will need home health on discharge.  Care management aware -Palliative care has also evaluated the patient.  Patient remains full code for now.  Outpatient follow-up with palliative care.  Lactic acidosis -Secondary to sepsis.  Resolved  Elevated troponin -Trend was flat.  Most likely from demand ischemia.     Cardiology evaluation and follow-up appreciated.  Outpatient follow-up with cardiology.  Paroxysmal A. fib -Currently has intermittent tachycardia.  Continue Eliquis.  Continue metoprolol on discharge.  Leukocytosis -Questionable cause.  Resolved.  Chronic anemia -Hemoglobin stable.  Required 1 unit of packed red cell transfusion during this hospitalization.    Outpatient follow-up  History of rheumatoid arthritis with chronic steroid use -Appears to be on chronic prednisone.  Stress dose IV Solu-Cortef has been tapered to home dose of prednisone -Resume home dose of oral prednisone on discharge.  Outpatient follow-up  Chronic pancreatitis -Continue Creon  History of seizures--continue Depakote  History of CVA -Continue Eliquis.  Chronic pain syndrome -narcotics were gradually tapered and stopped on 01/09/2019.  Patient agreed to stopping narcotics.  Outpatient follow-up  Hyperkalemia -Resolved  Diabetes mellitus type 2 with hyperglycemia -Patient was treated with Lantus while in the hospital.  Blood sugars are on the lower side.  Will let her follow-up with her primary care provider regarding continuation of insulin versus oral antihyperglycemic agents.  Patient has history of noncompliance. Discharge Instructions  Discharge Instructions    (HEART FAILURE PATIENTS) Call MD:  Anytime you have any of the following symptoms: 1) 3 pound weight gain in 24 hours or 5 pounds in 1 week 2) shortness of breath, with or without a dry hacking cough 3) swelling in the hands, feet or stomach 4) if you have to sleep on extra pillows at night in order to breathe.   Complete by:  As directed    Ambulatory referral to Cardiology   Complete by:  As directed    CHF followup   Call MD for:  persistant nausea and vomiting   Complete by:  As directed    Call MD for:  temperature >100.4   Complete by:  As directed    Diet - low sodium heart healthy   Complete by:  As directed    Diet  recommendations: Thin liquid(full liquid diet) Liquids provided via: Straw;Cup;Teaspoon Medication Administration: Crushed with puree Supervision: Staff to assist with self feeding;Full supervision/cueing for compensatory strategies Compensations: Slow rate;Small sips/bites Postural Changes and/or Swallow Maneuvers: Seated upright 90 degrees   Diet - low sodium heart healthy   Complete by:  As directed    Diet Carb Modified   Complete by:  As directed    Discharge instructions   Complete by:  As directed    Follow with Primary MD  Sandi Mariscal, MD in 1 week  Please get a complete blood count and chemistry panel checked by your Primary MD at your next visit, and again as instructed by your Primary MD.  Get Medicines reviewed and adjusted: Please take all your medications with you for your next visit with your Primary MD  Laboratory/radiological data: Please request your Primary MD to go over all hospital tests and procedure/radiological results at the follow up, please ask your Primary MD to get all Hospital records sent to his/her office.  In some cases, they will be blood work, cultures and biopsy results pending at the time of your discharge. Please request that your primary care M.D. follows up on these results.  Also Note the following: If you experience worsening of your admission symptoms, develop shortness of breath, life threatening emergency, suicidal or homicidal thoughts you must seek medical attention immediately by calling 911 or calling your MD immediately  if symptoms less severe.  You must read complete instructions/literature along with all the  possible adverse reactions/side effects for all the Medicines you take and that have been prescribed to you. Take any new Medicines after you have completely understood and accpet all the possible adverse reactions/side effects.   Do not drive when taking Pain medications or sleeping medications (Benzodaizepines)  Do not take more  than prescribed Pain, Sleep and Anxiety Medications. It is not advisable to combine anxiety,sleep and pain medications without talking with your primary care practitioner  Special Instructions: If you have smoked or chewed Tobacco  in the last 2 yrs please stop smoking, stop any regular Alcohol  and or any Recreational drug use.  Wear Seat belts while driving.  Please note: You were cared for by a hospitalist during your hospital stay. Once you are discharged, your primary care physician will handle any further medical issues. Please note that NO REFILLS for any discharge medications will be authorized once you are discharged, as it is imperative that you return to your primary care physician (or establish a relationship with a primary care physician if you do not have one) for your post hospital discharge needs so that they can reassess your need for medications and monitor your lab values.   Increase activity slowly   Complete by:  As directed    Increase activity slowly   Complete by:  As directed      Allergies as of 01/16/2019      Reactions   Ivp Dye [iodinated Diagnostic Agents] Itching   Metrizamide Itching      Medication List    STOP taking these medications   esomeprazole 40 MG capsule Commonly known as:  NEXIUM   Oxycodone HCl 20 MG Tabs     TAKE these medications   albuterol 108 (90 Base) MCG/ACT inhaler Commonly known as:  PROVENTIL HFA;VENTOLIN HFA Inhale 2 puffs into the lungs every 4 (four) hours as needed for wheezing or shortness of breath.   allopurinol 300 MG tablet Commonly known as:  ZYLOPRIM Take 300 mg by mouth daily.   apixaban 2.5 MG Tabs tablet Commonly known as:  ELIQUIS Take 2.5 mg by mouth 2 (two) times daily.   atorvastatin 10 MG tablet Commonly known as:  LIPITOR Take 10 mg by mouth daily at 6 PM.   colestipol 1 g tablet Commonly known as:  COLESTID Take 1 g by mouth 2 (two) times daily.   feeding supplement (ENSURE ENLIVE) Liqd Take  237 mLs by mouth 2 (two) times daily between meals.   furosemide 40 MG tablet Commonly known as:  LASIX Take 1 tablet (40 mg total) by mouth 2 (two) times daily for 30 days. What changed:    medication strength  how much to take  when to take this   metoprolol succinate 25 MG 24 hr tablet Commonly known as:  TOPROL-XL Take 1 tablet (25 mg total) by mouth daily.   multivitamin with minerals Tabs tablet Take 1 tablet by mouth daily.   Pancrelipase (Lip-Prot-Amyl) 24000-76000 units Cpep Take 3 capsules by mouth 3 (three) times daily.   pantoprazole 40 MG tablet Commonly known as:  PROTONIX Take 40 mg by mouth 2 (two) times daily.   polyethylene glycol packet Commonly known as:  MIRALAX / GLYCOLAX Take 17 g by mouth daily.   potassium chloride SA 20 MEQ tablet Commonly known as:  K-DUR,KLOR-CON Take 1 tablet (20 mEq total) by mouth daily.   predniSONE 5 MG tablet Commonly known as:  DELTASONE Take 1 tablet (5 mg total) by mouth daily  with breakfast. What changed:    when to take this  additional instructions   senna 8.6 MG Tabs tablet Commonly known as:  SENOKOT Take 2 tablets (17.2 mg total) by mouth at bedtime.   valproic acid 250 MG capsule Commonly known as:  DEPAKENE Take 500 mg by mouth 3 (three) times daily.       Follow-up Information    Sandi Mariscal, MD. Schedule an appointment as soon as possible for a visit in 1 week(s).   Specialty:  Internal Medicine Why:  with repeat cbc/bmp Contact information: [address] [address] [phone]        [place], [place] Follow up.   Specialty:  Home Health Services Why:  Home health services arranged Contact information: [address] [place] [address] [phone]        Palliative care. Schedule an appointment as soon as possible for a visit in 1 week(s).          Allergies  Allergen Reactions  . Ivp Dye [Iodinated Diagnostic Agents] Itching  .  Metrizamide Itching    Consultations: Cardiology/Pulmonary/palliative care   Procedures/Studies: Ct Abdomen Pelvis Wo Contrast  Result Date: 12/29/2018 CLINICAL DATA:  Abdomen distension two days ago here for food poisoning Allergic to iv contrast no oral contrast per MD EXAM: CT ABDOMEN AND PELVIS WITHOUT CONTRAST TECHNIQUE: Multidetector CT imaging of the abdomen and pelvis was performed following the standard protocol without IV contrast. COMPARISON:  12/27/2018 FINDINGS: Lower chest: There is bibasilar atelectasis. Hepatobiliary: The liver is homogeneous. Status post cholecystectomy. Pancreas: Pancreas is atrophic. Coarse calcifications throughout the pancreatic body and tail suggests history of chronic pancreatitis. Spleen: Normal in size without focal abnormality. Adrenals/Urinary Tract: Adrenal glands are unremarkable. Kidneys are normal, without renal calculi, focal lesion, or hydronephrosis. Bladder is unremarkable. Stomach/Bowel: Stomach is unremarkable. Small bowel loops are normal in appearance. There are numerous colonic diverticula. No acute diverticulitis. Significant stool within the rectosigmoid colon. There has been some improvement in the appearance of thickening of the rectosigmoid colon. No perirectal abscess. Vascular/Lymphatic: There is dense atherosclerotic calcification of the abdominal aorta not associated with aneurysm. No retroperitoneal or mesenteric adenopathy. Reproductive: The uterus is present. Multiple mural calcifications are present. No adnexal mass. Other: No ascites. No abdominal wall hernia. Note is made of fat within the LEFT inguinal ring, likely a lipoma. Diffuse body wall edema. Musculoskeletal: Remote wedge compression fractures of T12, L1, and L3. There is 2 millimeters anterolisthesis of L4 on L5. Degenerative changes are identified at L4-5 and L5-S1. No suspicious lytic or blastic lesions are identified. IMPRESSION: 1. Significant stool within the rectosigmoid  colon. There has been some improvement in the appearance of thickening of the rectosigmoid colon. 2. Diverticulosis without acute diverticulitis. 3. Status post cholecystectomy. 4. Changes of chronic pancreatitis. 5. Remote wedge compression fractures of T12, L1, and L3. Aortic Atherosclerosis (ICD10-I70.0). Electronically Signed   By: Nolon Nations M.D.   On: 12/29/2018 13:12   Dg Chest 2 View  Result Date: 01/15/2019 CLINICAL DATA:  Dyspnea. EXAM: CHEST - 2 VIEW COMPARISON:  01/09/2019 FINDINGS: Stably enlarged cardiac silhouette. Mediastinal contours appear intact. There is no evidence of pleural effusion or pneumothorax. Improved but not completely resolved linear peribronchial opacities throughout both lungs. More nodular opacity seen in the mid right lung field. Osseous structures are without acute abnormality. Stable spinal fusion. Soft tissues are grossly normal. IMPRESSION: 1. Improved but not completely resolved linear peribronchial opacities throughout  both lungs. 2. More nodular opacity in the mid right lung field. This may represent an area of airspace consolidation, however pulmonary nodule cannot be excluded. Follow-up to resolution after empiric treatment is recommended. Electronically Signed   By: Fidela Salisbury M.D.   On: 01/15/2019 15:30   Dg Chest 2 View  Result Date: 01/11/2019 CLINICAL DATA:  Shortness of breath. EXAM: CHEST - 2 VIEW COMPARISON:  Chest x-ray from yesterday. FINDINGS: Stable cardiomegaly. Improving interstitial edema superimposed on chronic interstitial changes. No focal consolidation, pleural effusion, or pneumothorax. Unchanged thoracic spine hardware. IMPRESSION: 1. Improving pulmonary edema. Electronically Signed   By: Titus Dubin M.D.   On: 01/11/2019 10:49   Dg Chest Port 1 View  Result Date: 01/10/2019 CLINICAL DATA:  Short of breath.  Chest pain.  Weakness. EXAM: PORTABLE CHEST 1 VIEW COMPARISON:  01/08/2019 FINDINGS: Mild cardiomegaly. Low  volumes. Diffuse airspace opacities throughout both lungs. Small pleural effusions. Spinal stabilization hardware is stable. No pneumothorax or pleural effusion. IMPRESSION: Stable diffuse bilateral airspace opacities. Electronically Signed   By: Marybelle Killings M.D.   On: 01/10/2019 08:14   Dg Chest Port 1 View  Result Date: 01/08/2019 CLINICAL DATA:  Shortness of breath, asthma, hypertension, history of stroke, GERD, former smoker EXAM: PORTABLE CHEST 1 VIEW COMPARISON:  Portable exam 0650 hours compared to 01/06/2019 FINDINGS: Enlargement of cardiac silhouette. Stable mediastinal contours. Diffuse BILATERAL pulmonary infiltrates question pulmonary edema though infection is not excluded. No gross pleural effusion or pneumothorax. Bones demineralized with prior thoracic spinal fixation. BILATERAL glenohumeral degenerative changes. Old RIGHT rib fractures. Chronic lytic lesion at proximal RIGHT humerus. IMPRESSION: Enlargement of cardiac silhouette with peripheral cyst and diffuse pulmonary infiltrates favoring pulmonary edema though infection is not excluded. No interval change. Electronically Signed   By: Lavonia Dana M.D.   On: 01/08/2019 09:51   Dg Chest Port 1 View  Result Date: 01/06/2019 CLINICAL DATA:  Cough and congestion. EXAM: PORTABLE CHEST 1 VIEW COMPARISON:  January 05, 2019 FINDINGS: Stable cardiomegaly. Stable small pleural effusions. Increasing pulmonary opacities suggestive of worsening edema. Persistent lytic lesion in the proximal right humerus. IMPRESSION: 1. Cardiomegaly and worsening pulmonary edema.  Small effusions. 2. Persistent lytic lesion in the proximal right humerus. Gout was suggested on previous right shoulder x-rays. Electronically Signed   By: Dorise Bullion III M.D   On: 01/06/2019 09:09   Dg Chest Port 1 View  Result Date: 01/05/2019 CLINICAL DATA:  Five day history of shortness of breath. Acute mid chest pain that began today. EXAM: PORTABLE CHEST 1 VIEW COMPARISON:   01/04/2019 and earlier. FINDINGS: Cardiac silhouette moderately enlarged, unchanged. Increasing interstitial opacities superimposed upon the previously identified interstitial lung disease. Stable small BILATERAL pleural effusions. Prior thoracic spine corpectomy and fusion. Lytic lesion in the proximal RIGHT humerus. Severe degenerative changes involving both shoulders. IMPRESSION: 1. Developing interstitial pulmonary edema is suspected, superimposed upon the previously identified interstitial lung disease. 2. Stable small BILATERAL pleural effusions. 3. Lytic lesion involving the proximal RIGHT humerus. Does the patient have a known malignancy or multiple myeloma? Electronically Signed   By: Evangeline Dakin M.D.   On: 01/05/2019 08:58   Dg Chest Port 1 View  Result Date: 01/04/2019 CLINICAL DATA:  Difficulty breathing EXAM: PORTABLE CHEST 1 VIEW COMPARISON:  December 29, 2018; November 13, 2018 FINDINGS: Diffuse interstitial thickening remains stable. There is no frank edema consolidation. Heart is upper normal in size with pulmonary vascularity normal. No adenopathy. Extensive postoperative change in the thoracic spine  noted. Evidence of old right-sided rib trauma laterally. There is arthropathy in the shoulders, stable. Cystic area in the proximal right humerus is stable. IMPRESSION: Diffuse interstitial thickening, likely reflecting chronic fibrosis. No frank edema or consolidation evident. Stable cardiac silhouette. Extensive postoperative change in the thoracic spine. Advanced arthropathy in the shoulders. Prior rib trauma on the right. Electronically Signed   By: Lowella Grip III M.D.   On: 01/04/2019 08:51   Dg Chest Port 1 View  Result Date: 12/29/2018 CLINICAL DATA:  63 year old female with altered mental status, fever, decreased p.o. intake. EXAM: PORTABLE CHEST 1 VIEW COMPARISON:  Portable chest 11/13/2018 and earlier. FINDINGS: Portable AP semi upright view at 1320 hours. Sequelae of  previous thoracic spine corpectomy and posterior fusion. Hardware appears stable. Stable visualized osseous structures. Stable lung volumes and mediastinal contours. Visualized tracheal air column is within normal limits. Chronic increased pulmonary interstitial markings appear stable since 2019. No pneumothorax, pleural effusion or acute pulmonary opacity. Mildly increased bowel gas in the abdomen. Stable cholecystectomy clips. IMPRESSION: Stable.  No acute cardiopulmonary abnormality. Electronically Signed   By: Genevie Ann M.D.   On: 12/29/2018 14:03   Dg Abd Portable 1v  Result Date: 12/30/2018 CLINICAL DATA:  Abdominal pain EXAM: PORTABLE ABDOMEN - 1 VIEW COMPARISON:  CT 12/29/2018 FINDINGS: Surgical hardware in the lower thoracic spine. Dense aortic atherosclerosis. Mild diffuse increased bowel gas without obstructive pattern. Possible bowel wall thickening at the hepatic flexure and descending colon. No radiopaque calculi. IMPRESSION: Diffuse increased bowel gas without obstructive pattern. There may be bowel wall edema at the hepatic flexure and descending colon as may be seen with colitis Electronically Signed   By: Donavan Foil M.D.   On: 12/30/2018 21:45   Dg Abd Portable 2v  Result Date: 01/01/2019 CLINICAL DATA:  Vomiting. EXAM: PORTABLE ABDOMEN - 2 VIEW COMPARISON:  Radiograph of December 30, 2018. FINDINGS: No small bowel dilatation is noted. Mildly dilated colon loop is seen in right side of abdomen which most likely represents ileus. Status post cholecystectomy. There is no evidence of free air. No radio-opaque calculi or other significant radiographic abnormality is seen. IMPRESSION: Mildly dilated colonic loops seen in right side of abdomen which may represent ileus. Electronically Signed   By: Marijo Conception, M.D.   On: 01/01/2019 14:50       Subjective: Patient seen and examined at bedside.  She is a poor historian.  She does not feel that well but is okay going home today.  No  overnight fever or vomiting.  Discharge Exam: Vitals:   01/15/19 2057 01/15/19 2125  BP:  (!) 85/55  Pulse:  (!) 114  Resp:  17  Temp:  97.6 F (36.4 C)  SpO2: 97% (!) 77%   Vitals:   01/15/19 0845 01/15/19 2057 01/15/19 2125 01/16/19 0312  BP: 95/73  (!) 85/55   Pulse: (!) 105  (!) 114   Resp: (!) 21  17   Temp:   97.6 F (36.4 C)   TempSrc:   Oral   SpO2: 99% 97% (!) 77%   Weight:    39.9 kg  Height:        General: Pt is alert, awake, not in acute distress.  Very thinly built.  Looks chronically ill. Cardiovascular: Intermittent tachycardia, S1/S2 + Respiratory: bilateral decreased breath sounds at bases with basilar crackles Abdominal: Soft, NT, ND, bowel sounds + Extremities: Trace edema, no cyanosis    The results of significant diagnostics from this hospitalization (  including imaging, microbiology, ancillary and laboratory) are listed below for reference.     Microbiology: No results found for this or any previous visit (from the past 240 hour(s)).   Labs: BNP (last 3 results) Recent Labs    12/29/18 1223 01/05/19 0619 01/06/19 0846  BNP 969.8* 984.3* 7,026.3*   Basic Metabolic Panel: Recent Labs  Lab 01/10/19 0741 01/11/19 0416 01/12/19 0547 01/13/19 0554 01/14/19 0555 01/15/19 0612 01/16/19 0459  NA 133* 134* 131*  --  136 139 140  K 3.3* 3.3* 5.2* 5.7* 4.8 3.8 4.2  CL 89* 94* 95*  --  101 100 104  CO2 33* 30 26  --  '26 27 25  '$ GLUCOSE 267* 104* 313*  --  106* 65* 106*  BUN 12 16 21*  --  36* 40* 50*  CREATININE 1.22* 1.07* 0.97  --  0.87 1.00 1.21*  CALCIUM 7.5* 7.7* 8.0*  --  8.1* 8.5* 8.7*  MG 1.5* 2.2 2.0  --   --  1.9 1.8   Liver Function Tests: No results for input(s): AST, ALT, ALKPHOS, BILITOT, PROT, ALBUMIN in the last 168 hours. No results for input(s): LIPASE, AMYLASE in the last 168 hours. No results for input(s): AMMONIA in the last 168 hours. CBC: Recent Labs  Lab 01/10/19 0741 01/11/19 0416 01/15/19 0612  01/16/19 0459  WBC 6.9 14.5* 12.2* 10.6*  NEUTROABS  --   --   --  5.3  HGB 8.3* 8.2* 8.7* 8.4*  HCT 24.5* 24.6* 27.0* 26.6*  MCV 76.1* 76.9* 82.1 85.0  PLT 187 217 370 382   Cardiac Enzymes: No results for input(s): CKTOTAL, CKMB, CKMBINDEX, TROPONINI in the last 168 hours. BNP: Invalid input(s): POCBNP CBG: Recent Labs  Lab 01/15/19 0823 01/15/19 1200 01/15/19 1701 01/15/19 2124 01/16/19 0801  GLUCAP 134* 109* 123* 172* 96   D-Dimer No results for input(s): DDIMER in the last 72 hours. Hgb A1c No results for input(s): HGBA1C in the last 72 hours. Lipid Profile No results for input(s): CHOL, HDL, LDLCALC, TRIG, CHOLHDL, LDLDIRECT in the last 72 hours. Thyroid function studies No results for input(s): TSH, T4TOTAL, T3FREE, THYROIDAB in the last 72 hours.  Invalid input(s): FREET3 Anemia work up No results for input(s): VITAMINB12, FOLATE, FERRITIN, TIBC, IRON, RETICCTPCT in the last 72 hours. Urinalysis    Component Value Date/Time   COLORURINE YELLOW 01/04/2019 2048   APPEARANCEUR HAZY (A) 01/04/2019 2048   LABSPEC 1.015 01/04/2019 2048   PHURINE 7.0 01/04/2019 2048   GLUCOSEU NEGATIVE 01/04/2019 2048   HGBUR SMALL (A) 01/04/2019 2048   BILIRUBINUR NEGATIVE 01/04/2019 2048   KETONESUR NEGATIVE 01/04/2019 2048   PROTEINUR 30 (A) 01/04/2019 2048   UROBILINOGEN 1.0 07/27/2015 1052   NITRITE NEGATIVE 01/04/2019 2048   LEUKOCYTESUR NEGATIVE 01/04/2019 2048   Sepsis Labs Invalid input(s): PROCALCITONIN,  WBC,  LACTICIDVEN Microbiology No results found for this or any previous visit (from the past 240 hour(s)).   Time coordinating discharge: 35 minutes  SIGNED:   Aline August, MD  [facility] 01/16/2019, 11:25 AM

## 2019-01-16 NOTE — Care Management Note (Addendum)
Case Management Note  Patient Details  Name: Amber [name] MRN: [id number] Date of Birth: [birth year]  Subjective/Objective:     Septic shock2/2E coligastroenteritis w/ bacteremia             Arkeisha Pallares [address] Liliah Torchio (Daughter)      [phone]         PCP: [name]               Action/Plan: Transition to home today with home health services. NCM awaiting AMB PUL  O2 qualifier results in order  to setup home oxygen, nurse made aware.  Antoin( son ) to provide transportation to home.  Expected Discharge Date:  01/04/19               Expected Discharge Plan:  Home w Home Health Services  In-House Referral:  Clinical Social Work  Discharge planning Services  CM Consult  Post Acute Care Choice:  NA Choice offered to:  Patient, Adult Children  DME Arranged:  N/A DME Agency:  NA  HH Arranged:  RN, PT, OT, Speech Therapy, Nurse's Aide, Social Work [employer] Agency:  [facility] Community-based palliative care to follow at home, referral made with [facility]  Status of Service:  In process  If discussed at Long Length of Stay Meetings, dates discussed:    Additional Comments:  Epifanio Lesches, RN 01/16/2019, 10:07 AM

## 2019-01-31 ENCOUNTER — Telehealth: Payer: Self-pay

## 2019-01-31 NOTE — Telephone Encounter (Signed)
Attempted call to schedule Palliative Care x 2 - Busy

## 2019-02-04 ENCOUNTER — Telehealth: Payer: Self-pay

## 2019-02-04 NOTE — Telephone Encounter (Signed)
Phone call placed to patient to introduce palliative care and to schedule visit. Patient was receptive to scheduling. Scheduled for 02/06/2019

## 2019-02-05 ENCOUNTER — Ambulatory Visit: Payer: Medicaid Other | Admitting: Cardiology

## 2019-02-05 DIAGNOSIS — I5032 Chronic diastolic (congestive) heart failure: Secondary | ICD-10-CM | POA: Insufficient documentation

## 2019-02-05 DIAGNOSIS — N183 Chronic kidney disease, stage 3 unspecified: Secondary | ICD-10-CM | POA: Insufficient documentation

## 2019-02-05 DIAGNOSIS — Z7901 Long term (current) use of anticoagulants: Secondary | ICD-10-CM | POA: Insufficient documentation

## 2019-02-05 NOTE — Progress Notes (Deleted)
Cardiology Office Note:    Date:  02/05/2019   ID:  Amber [name], DOB [birth year], MRN [id number]  PCP:  [name], MD  Cardiologist:  Norman Herrlich, MD    Referring MD: Glade Lloyd, MD    ASSESSMENT:    No diagnosis found. PLAN:    In order of problems listed above:  1. ***   Next appointment: ***   Medication Adjustments/Labs and Tests Ordered: Current medicines are reviewed at length with the patient today.  Concerns regarding medicines are outlined above.  No orders of the defined types were placed in this encounter.  No orders of the defined types were placed in this encounter.   No chief complaint on file.   History of Present Illness:    Amber [name] is a 63 y.o. female with a hx of acute on chronic heart failure last seen by dr Eldridge Dace 01/16/19.  She has a history of chronic diastolic heart failure hypertensive heart disease type 2 diabetes asthma rheumatoid arthritis on chronic steroids stroke in 2007 and seizure disorder.  She also has a history of a PFO there was discussion regarding closure and never recurred.  It states in the chart she had a myocardial perfusion study January 2018 that showed no evidence of ischemia.  Recent admission to the hospital 12/29/2018 was due to fever encephalopathy and hypotension due to systemic inflammatory response syndrome.  She was influenza B positive at the time and urine culture came back with vancomycin-resistant enterococcus.  During hospitalization her BNP level is elevated at 984 and after hydration she was restarted on diuretic.  Echocardiogram was performed 09/25/2018 that showed normal left ventricular size and wall thickness ejection fraction 65 to 70% and moderate left atrial enlargement.  She also had troponin elevation mild curve was static and ileus was felt to be due to demand ischemia or cardiac injury not myocardial infarction and they did not feel to be a need to pursue an ischemia evaluation She was transfused  prior to discharge with hemoglobin nadir of 8.2 and has CKD stage 3 GFR 56 cc/min Compliance with diet, lifestyle and medications: *** Past Medical History:  Diagnosis Date  . ACS (acute coronary syndrome) (HCC) 06/12/2015  . Acute on chronic blood loss anemia 11/25/2017  . Arthritis   . Asthma   . 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 Chronic pain   . Constipation   . Diabetes mellitus without complication (HCC)   . Duodenitis   . Embolic stroke (HCC)   . Falls frequently   . Focal seizure (HCC)   . GERD (gastroesophageal reflux disease)   . Hypertension   . Impacted fracture 02/15/2017   right glenoid  . Pneumonia 10/2015  . Seizures (HCC)    last seizure March 2015  . Small bowel obstruction (HCC) 11/25/2017    Past Surgical History:  Procedure Laterality Date  . APPENDECTOMY    . BACK SURGERY    . CHOLECYSTECTOMY    . TEE WITHOUT CARDIOVERSION N/A 10/30/2016   Procedure: TRANSESOPHAGEAL ECHOCARDIOGRAM (TEE);  Surgeon: Lars Masson, MD;  Location: [facility] ENDOSCOPY;  Service: Cardiovascular;  Laterality: N/A;    Current Medications: No outpatient medications have been marked as taking for the 02/05/19 encounter (Appointment) with Baldo Daub, MD.     Allergies:   Ivp dye [iodinated diagnostic agents] and Metrizamide   Social History   Socioeconomic History  . Marital status: Single    Spouse name: Not on file  .  Number of children: Not on file  . Years of education: Not on file  . Highest education level: Not on file  Occupational History  . Not on file  Social Needs  . Financial resource strain: Not on file  . Food insecurity:    Worry: Not on file    Inability: Not on file  . Transportation needs:    Medical: Not on file    Non-medical: Not on file  Tobacco Use  . Smoking status: Former Smoker    Last attempt to quit: 2010    Years since quitting: 10.2  . Smokeless tobacco: Never Used  Substance and Sexual Activity  .  Alcohol use: No    Comment: admits to 2 drinks/week  . Drug use: No  . Sexual activity: Never  Lifestyle  . Physical activity:    Days per week: Not on file    Minutes per session: Not on file  . Stress: Not on file  Relationships  . Social connections:    Talks on phone: Not on file    Gets together: Not on file    Attends religious service: Not on file    Active member of club or organization: Not on file    Attends meetings of clubs or organizations: Not on file    Relationship status: Not on file  Other Topics Concern  . Not on file  Social History Narrative  . Not on file     Family History: The patient's ***family history includes Hypertension in her brother and sister; Kidney failure in her mother. ROS:   Please see the history of present illness.    All other systems reviewed and are negative.  EKGs/Labs/Other Studies Reviewed:    The following studies were reviewed today:  EKG:  EKG ordered today and personally reviewed.  The ekg ordered today demonstrates ***  Recent Labs: 12/29/2018: TSH 1.744 01/01/2019: ALT 24 01/06/2019: B Natriuretic Peptide 1,244.3 01/16/2019: BUN 50; Creatinine, Ser 1.21; Hemoglobin 8.4; Magnesium 1.8; Platelets 382; Potassium 4.2; Sodium 140  Recent Lipid Panel    Component Value Date/Time   CHOL 105 10/27/2016 0532   TRIG 55 10/27/2016 0532   HDL 61 10/27/2016 0532   CHOLHDL 1.7 10/27/2016 0532   VLDL 11 10/27/2016 0532   LDLCALC 33 10/27/2016 0532    Physical Exam:    VS:  There were no vitals taken for this visit.    Wt Readings from Last 3 Encounters:  01/16/19 87 lb 15.4 oz (39.9 kg)  10/14/18 89 lb 4.6 oz (40.5 kg)  10/01/18 101 lb 13.6 oz (46.2 kg)     GEN: *** Well nourished, well developed in no acute distress HEENT: Normal NECK: No JVD; No carotid bruits LYMPHATICS: No lymphadenopathy CARDIAC: ***RRR, no murmurs, rubs, gallops RESPIRATORY:  Clear to auscultation without rales, wheezing or rhonchi  ABDOMEN:  Soft, non-tender, non-distended MUSCULOSKELETAL:  No edema; No deformity  SKIN: Warm and dry NEUROLOGIC:  Alert and oriented x 3 PSYCHIATRIC:  Normal affect    Signed, Norman Herrlich, MD  02/05/2019 8:42 AM    [facility]

## 2019-02-06 ENCOUNTER — Other Ambulatory Visit: Payer: Medicaid Other | Admitting: Internal Medicine

## 2019-02-06 ENCOUNTER — Other Ambulatory Visit: Payer: Self-pay

## 2019-02-06 DIAGNOSIS — Z515 Encounter for palliative care: Secondary | ICD-10-CM

## 2019-02-07 NOTE — Progress Notes (Signed)
    Therapist, nutritional Palliative Care Consult Note Telephone: [phone]  Fax: [phone]  PATIENT NAME: Amber [name] DOB: 10-27-1956 MRN: [id number]  PRIMARY CARE PROVIDER:   [name], MD  REFERRING PROVIDER:  [name], MD [address] [place], [place] [address]  NOTE:    Attempted telephone contact with patient on all listed telephone numbers along with [phone] without success prior to scheduled appointment today.  Unable to perform COVID 19 patient screening.  Today's appt. Will have to be rescheduled when we are able to contact pt or family member.  Plan on joint appointment with Palliative social worker on initial visit.  Notified Beryle Beams Nurse Navigator to attempt to contact pt and reschedule appt.    Margaretha Sheffield, NP-C

## 2019-02-14 ENCOUNTER — Telehealth: Payer: Self-pay

## 2019-02-14 IMAGING — DX DG CHEST 1V PORT
1 series · 1 of 1 positions shown · non-contrast
Comparison: Portable chest 11/13/2018 and earlier.

CLINICAL DATA: 62-year-old female with altered mental status,
fever, decreased p.o. intake.

EXAM:
PORTABLE CHEST 1 VIEW

[chest ap]
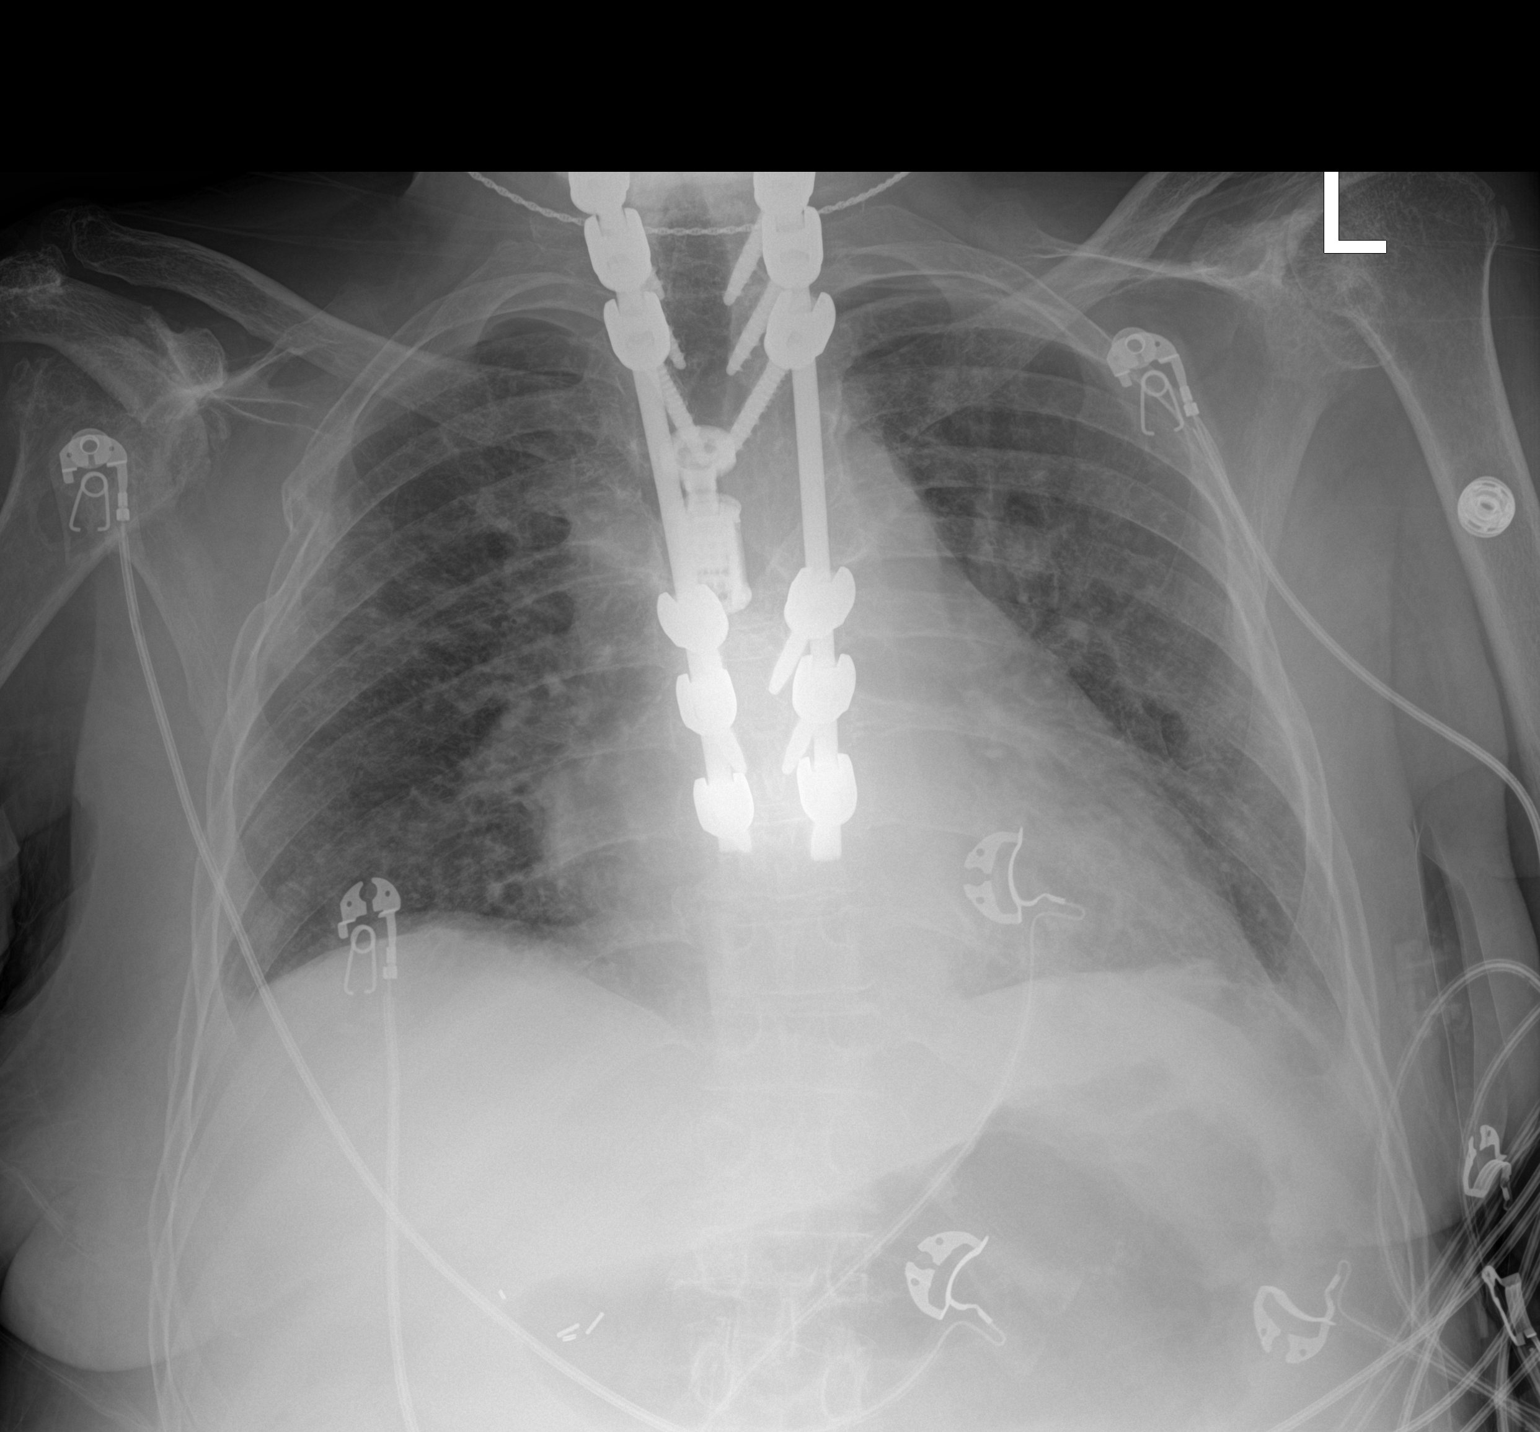

[1 of 1 positions shown; findings below may reference images not displayed]

FINDINGS: Portable AP semi upright view at 1193 hours. Sequelae of previous
thoracic spine corpectomy and posterior fusion. Hardware appears
stable. Stable visualized osseous structures. Stable lung volumes
and mediastinal contours. Visualized tracheal air column is within
normal limits. Chronic increased pulmonary interstitial markings
appear stable since 2591. No pneumothorax, pleural effusion or acute
pulmonary opacity. Mildly increased bowel gas in the abdomen. Stable
cholecystectomy clips.
IMPRESSION: Stable.  No acute cardiopulmonary abnormality.

## 2019-02-14 IMAGING — CT CT ABD-PELV W/O
2 of 4 series · 16 of 46 positions shown, 18 images · non-contrast
Comparison: 12/27/2018

CLINICAL DATA: Abdomen distension two days ago here for food
poisoning Allergic to iv contrast no oral contrast per [facility]

EXAM:
CT ABDOMEN AND PELVIS WITHOUT CONTRAST
TECHNIQUE: Multidetector CT imaging of the abdomen and pelvis was performed
following the standard protocol without IV contrast.

[Series 3: axial st · axial · 0.70mm/px · z∈[-208,+137]mm · 13 of 79 slices shown, 15 images]
[im 5/79  soft-tissue]
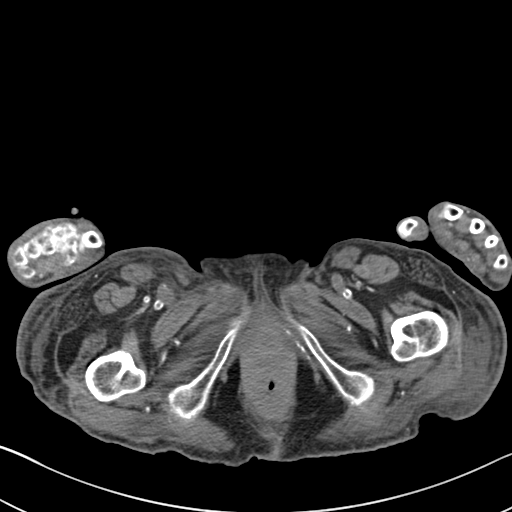
[im 5/79  bone]
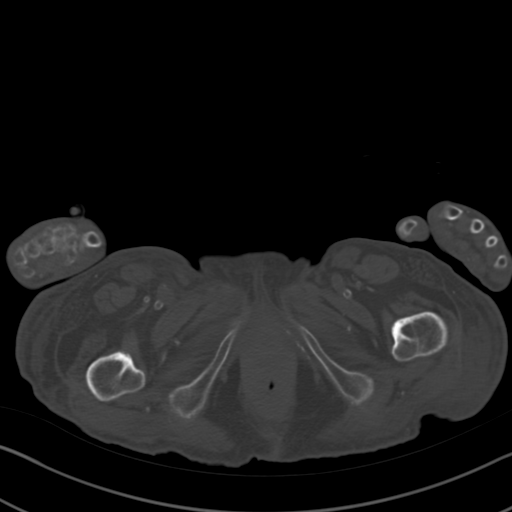
[im 13/79  soft-tissue]
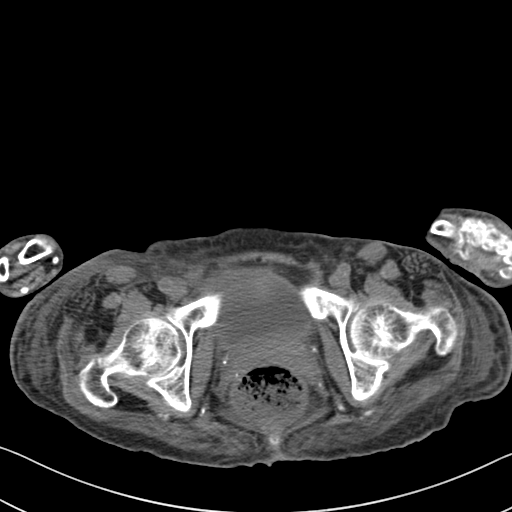
[im 17/79  soft-tissue]
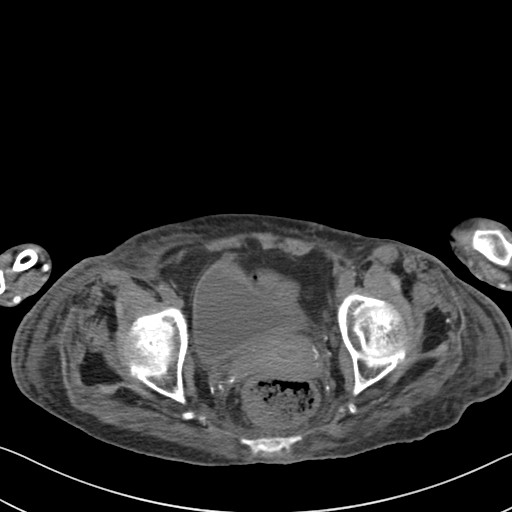
[im 21/79  soft-tissue]
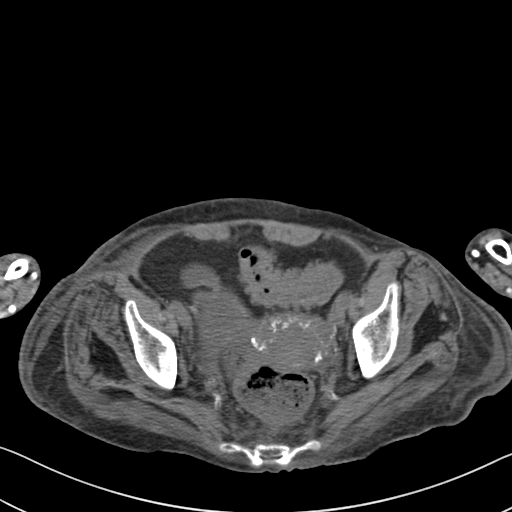
[im 29/79  soft-tissue]
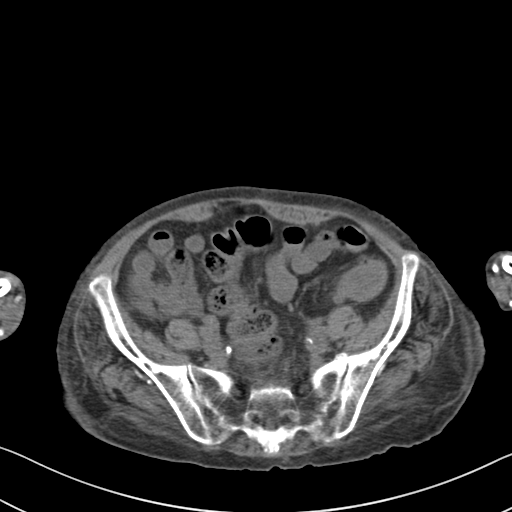
[im 33/79  soft-tissue]
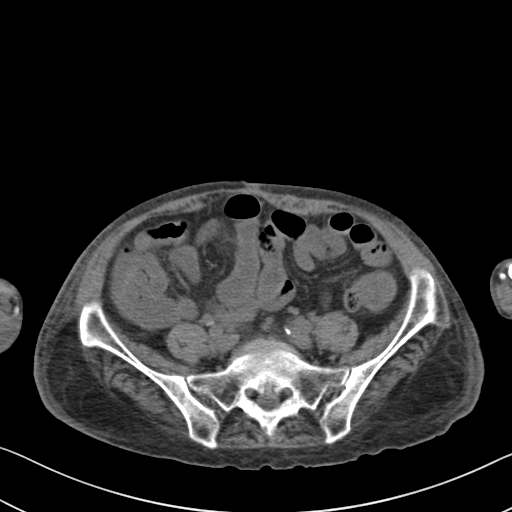
[im 42/79  soft-tissue]
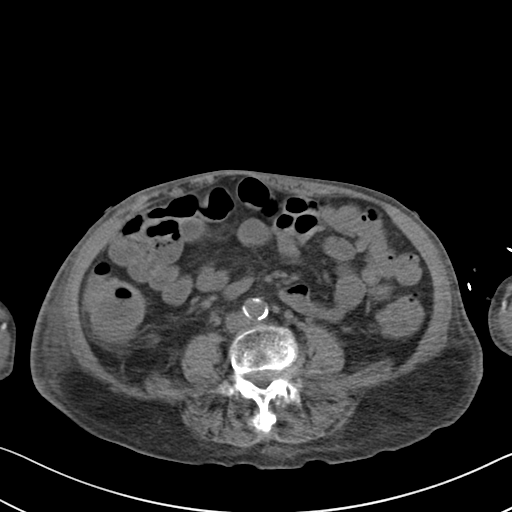
[im 46/79  soft-tissue]
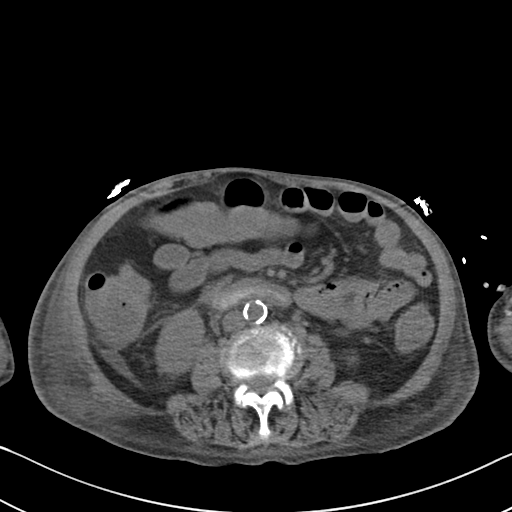
[im 50/79  soft-tissue]
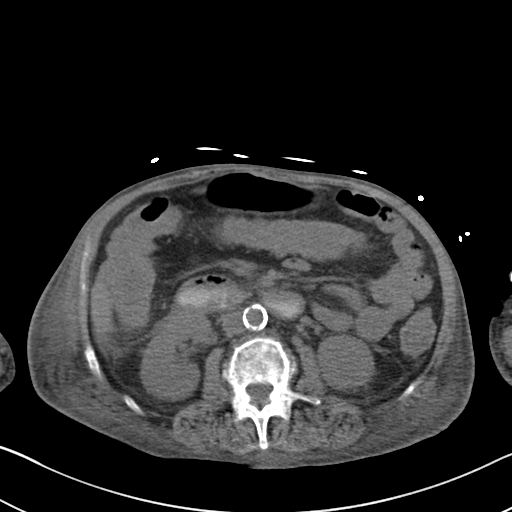
[im 50/79  bone]
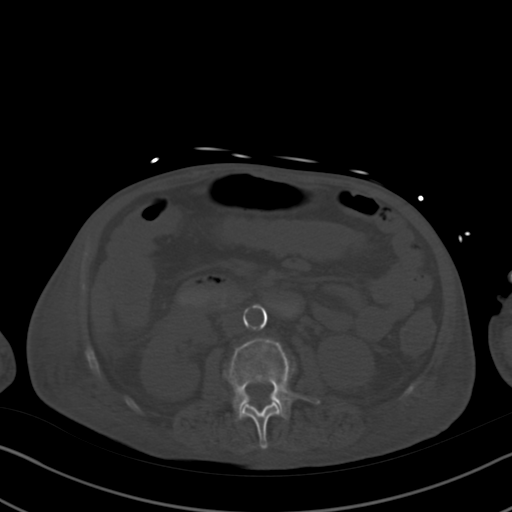
[im 58/79  soft-tissue]
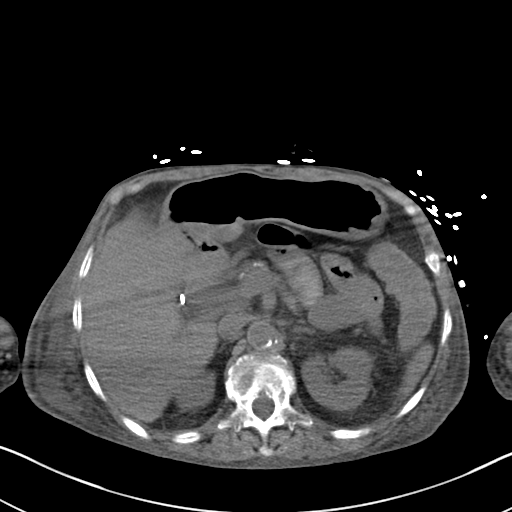
[im 62/79  soft-tissue]
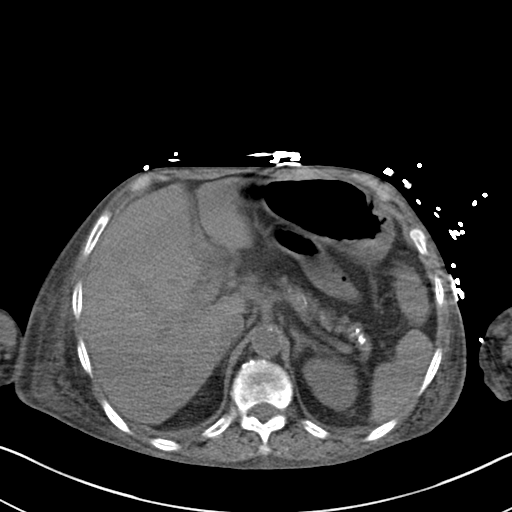
[im 66/79  soft-tissue]
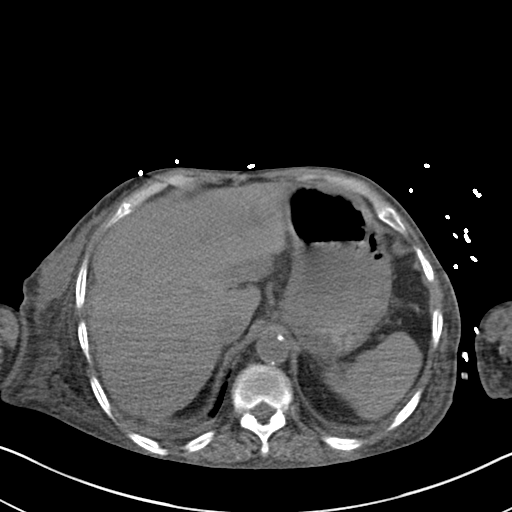
[im 74/79  soft-tissue]
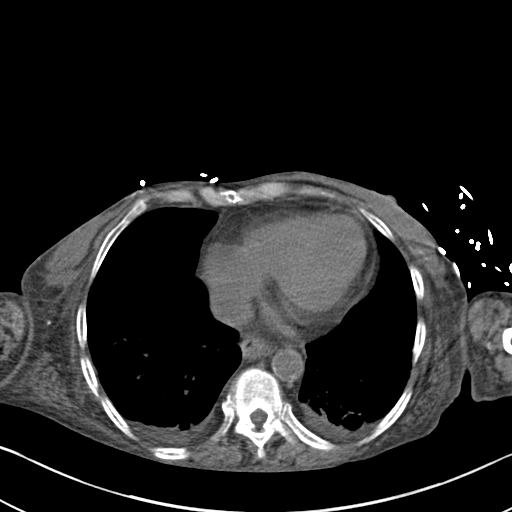

[Series 5: coronal st · coronal · 0.79mm/px · 3 of 92 slices shown]
[im 31/92  soft-tissue]
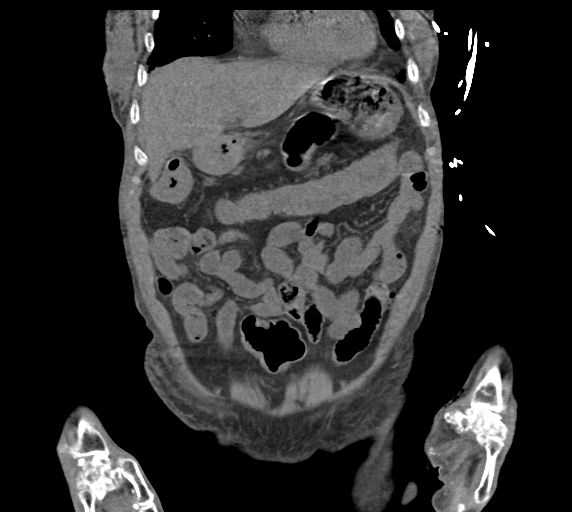
[im 41/92  soft-tissue]
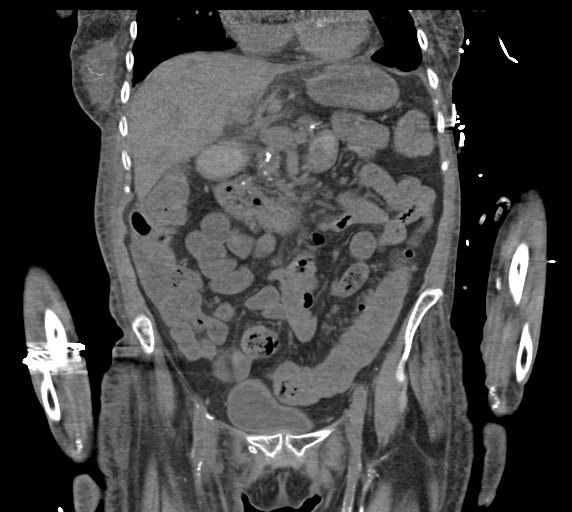
[im 51/92  soft-tissue]
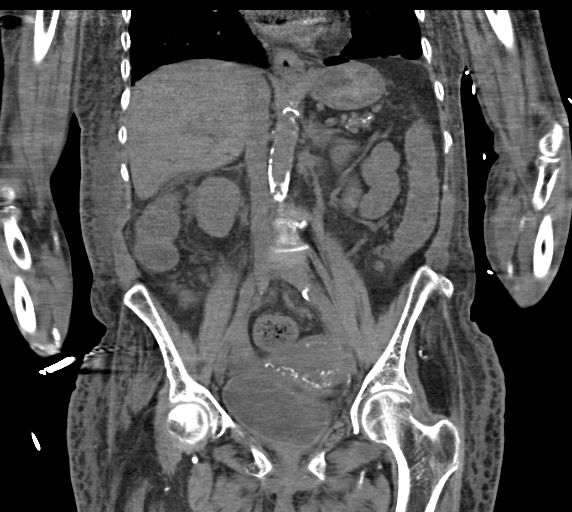

[16 of 46 positions shown; findings below may reference images not displayed]

FINDINGS: Lower chest: There is bibasilar atelectasis.

Hepatobiliary: The liver is homogeneous. Status post
cholecystectomy.

Pancreas: Pancreas is atrophic. Coarse calcifications throughout the
pancreatic body and tail suggests history of chronic pancreatitis.

Spleen: Normal in size without focal abnormality.

Adrenals/Urinary Tract: Adrenal glands are unremarkable. Kidneys are
normal, without renal calculi, focal lesion, or hydronephrosis.
Bladder is unremarkable.

Stomach/Bowel: Stomach is unremarkable. Small bowel loops are normal
in appearance. There are numerous colonic diverticula. No acute
diverticulitis. Significant stool within the rectosigmoid colon.
There has been some improvement in the appearance of thickening of
the rectosigmoid colon. No perirectal abscess.

Vascular/Lymphatic: There is dense atherosclerotic calcification of
the abdominal aorta not associated with aneurysm. No retroperitoneal
or mesenteric adenopathy.

Reproductive: The uterus is present. Multiple mural calcifications
are present. No adnexal mass.

Other: No ascites. No abdominal wall hernia. Note is made of fat
within the LEFT inguinal ring, likely a lipoma. Diffuse body wall
edema.

Musculoskeletal: Remote wedge compression fractures of T12, L1, and
L3. There is 2 millimeters anterolisthesis of L4 on L5. Degenerative
changes are identified at L4-5 and L5-S1. No suspicious lytic or
blastic lesions are identified.
IMPRESSION: 1. Significant stool within the rectosigmoid colon. There has been
some improvement in the appearance of thickening of the rectosigmoid
colon.
2. Diverticulosis without acute diverticulitis.
3. Status post cholecystectomy.
4. Changes of chronic pancreatitis.
5. Remote wedge compression fractures of T12, L1, and L3.

Aortic Atherosclerosis ([id number]).

## 2019-02-14 NOTE — Telephone Encounter (Signed)
Phone call placed to patient to follow up regarding missed visit previously scheduled. Patient made aware that due to coronovirus, telehealth visits are offered. Patient does not have an email. Plan is for NP to complete a virtual check in. Patient receptive and scheduled for Monday 02/17/2019 @ 11:30am

## 2019-02-15 IMAGING — DX DG ABD PORTABLE 1V
1 series · 1 of 1 positions shown · non-contrast
Comparison: CT 12/29/2018

CLINICAL DATA: Abdominal pain

EXAM:
PORTABLE ABDOMEN - 1 VIEW

[abdomen]
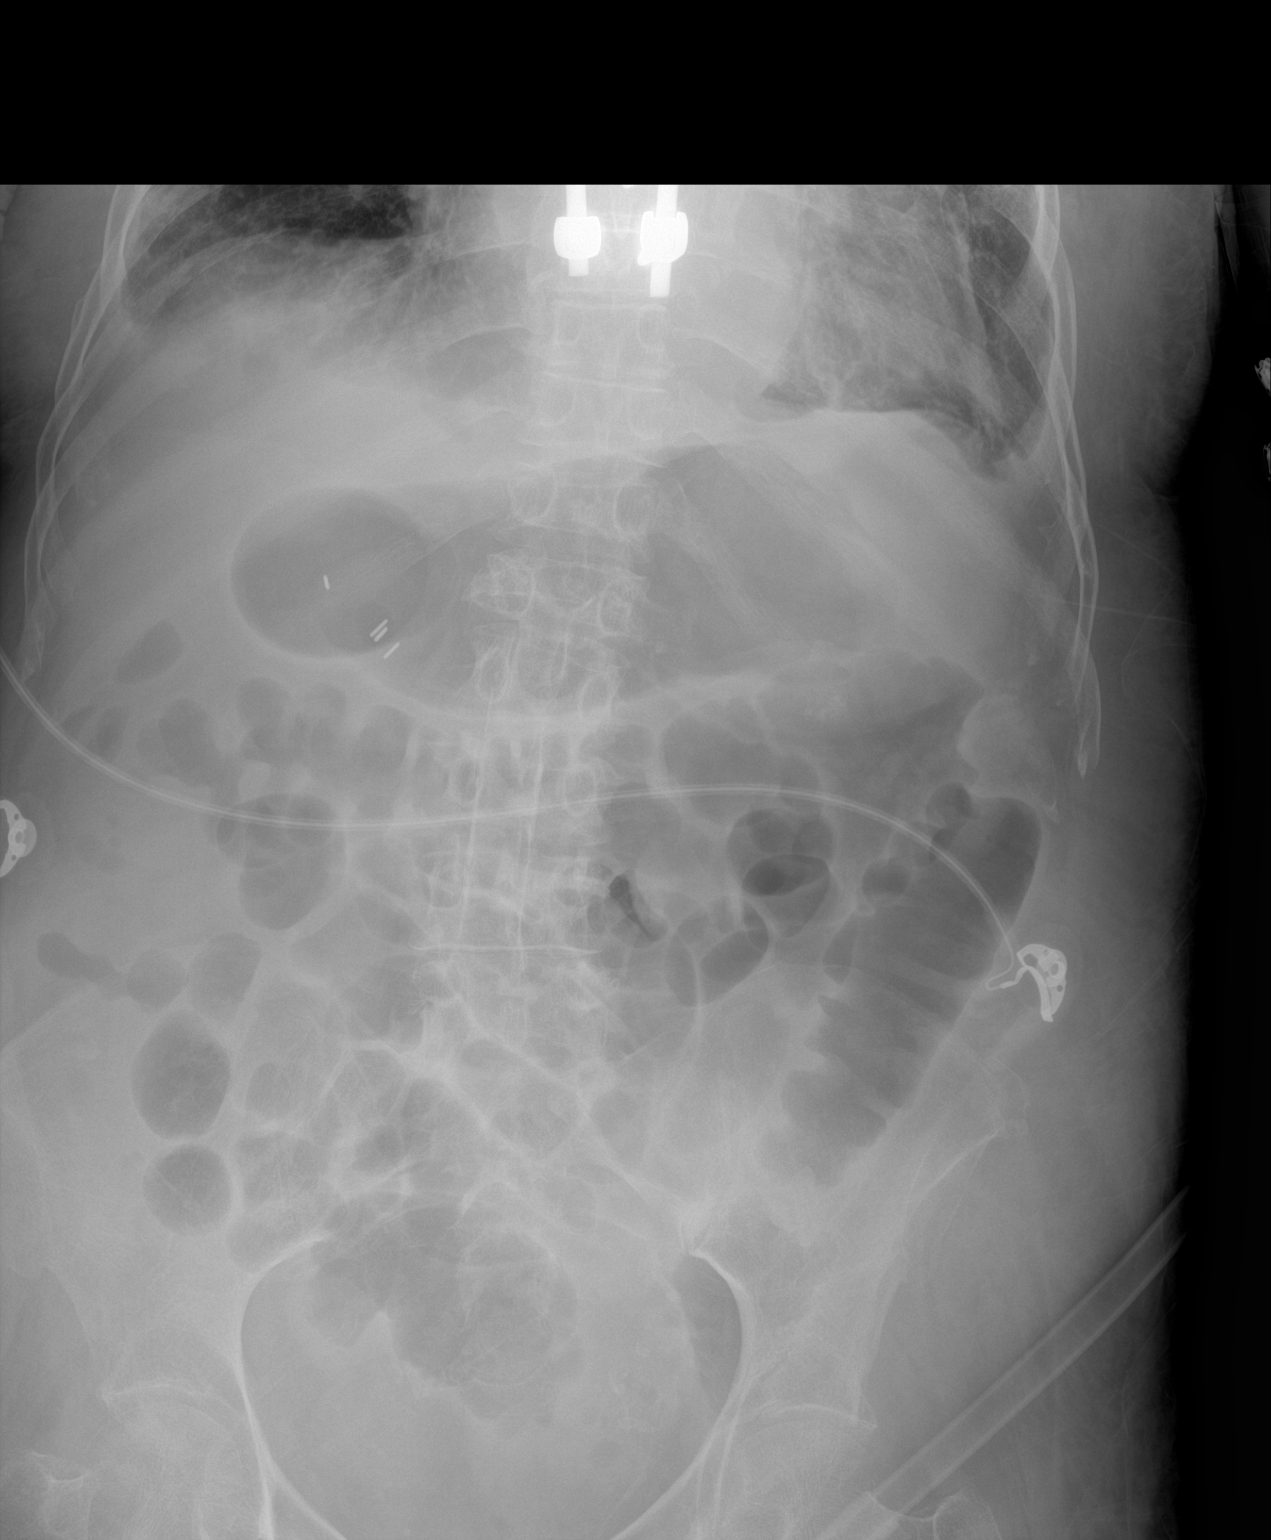

[1 of 1 positions shown; findings below may reference images not displayed]

FINDINGS: Surgical hardware in the lower thoracic spine. Dense aortic
atherosclerosis. Mild diffuse increased bowel gas without
obstructive pattern. Possible bowel wall thickening at the hepatic
flexure and descending colon. No radiopaque calculi.
IMPRESSION: Diffuse increased bowel gas without obstructive pattern. There may
be bowel wall edema at the hepatic flexure and descending colon as
may be seen with colitis

## 2019-02-17 ENCOUNTER — Telehealth: Payer: Medicaid Other | Admitting: Internal Medicine

## 2019-02-17 ENCOUNTER — Other Ambulatory Visit: Payer: Self-pay

## 2019-02-17 IMAGING — DX DG ABD PORTABLE 2V
2 series · 2 of 2 positions shown · non-contrast
Comparison: Radiograph December 30, 2018.

CLINICAL DATA: Vomiting.

EXAM:
PORTABLE ABDOMEN - 2 VIEW

[abdomen erect]
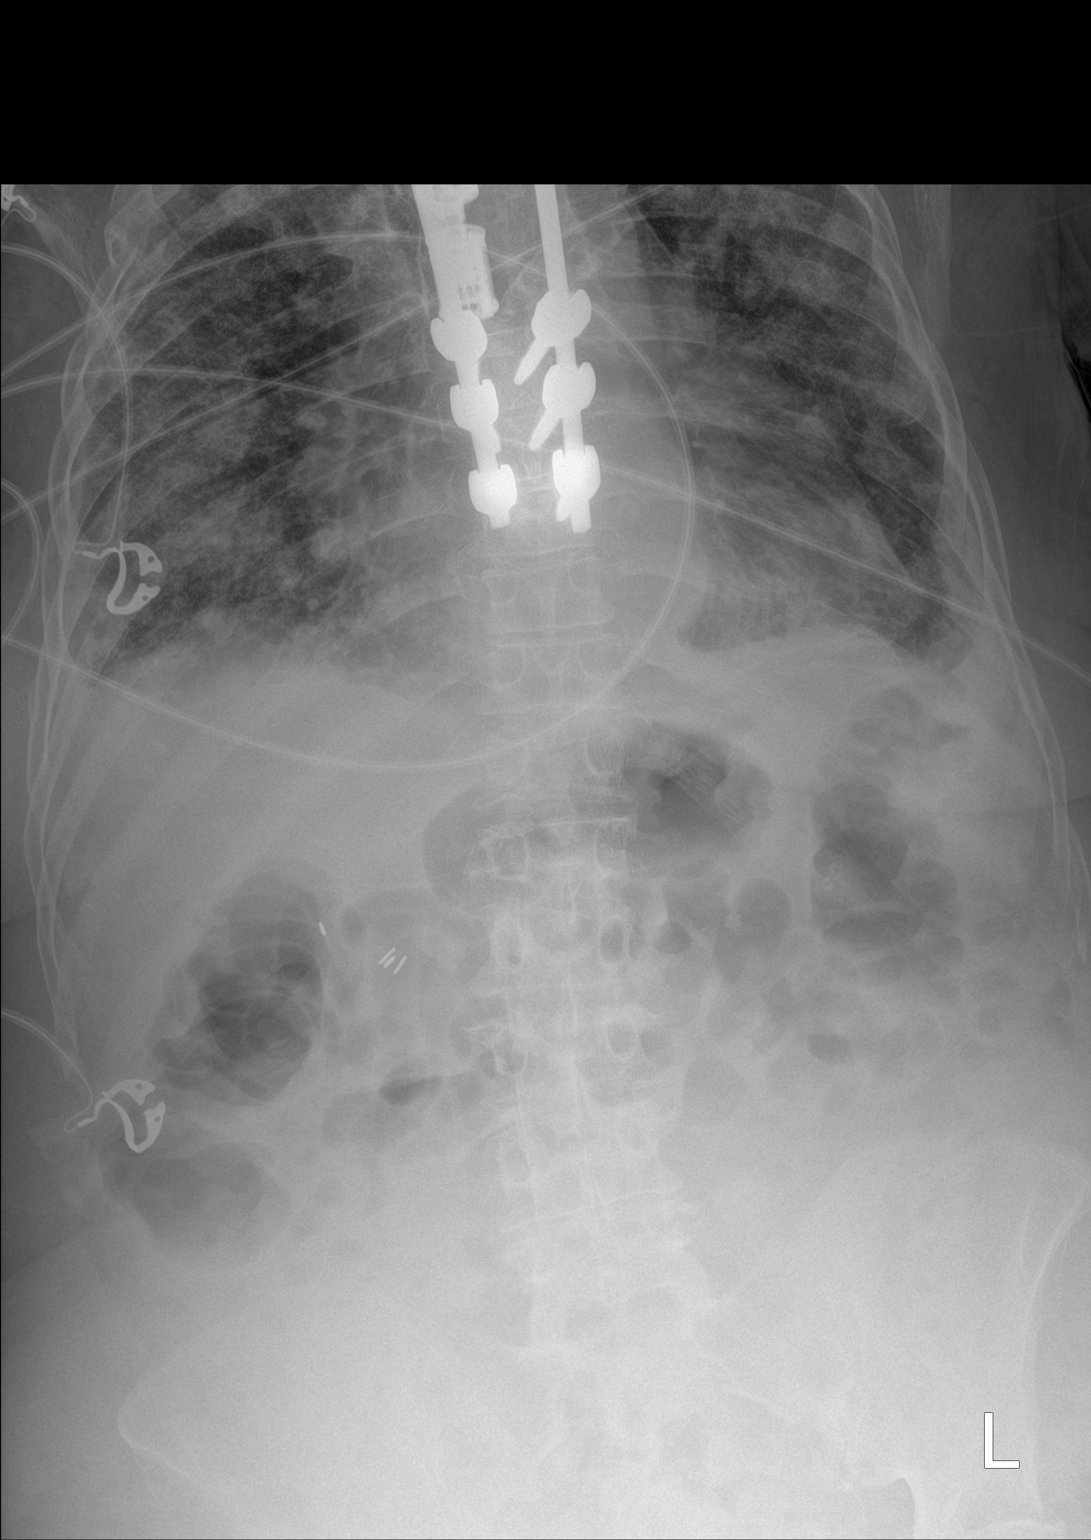

[abdomen supine]
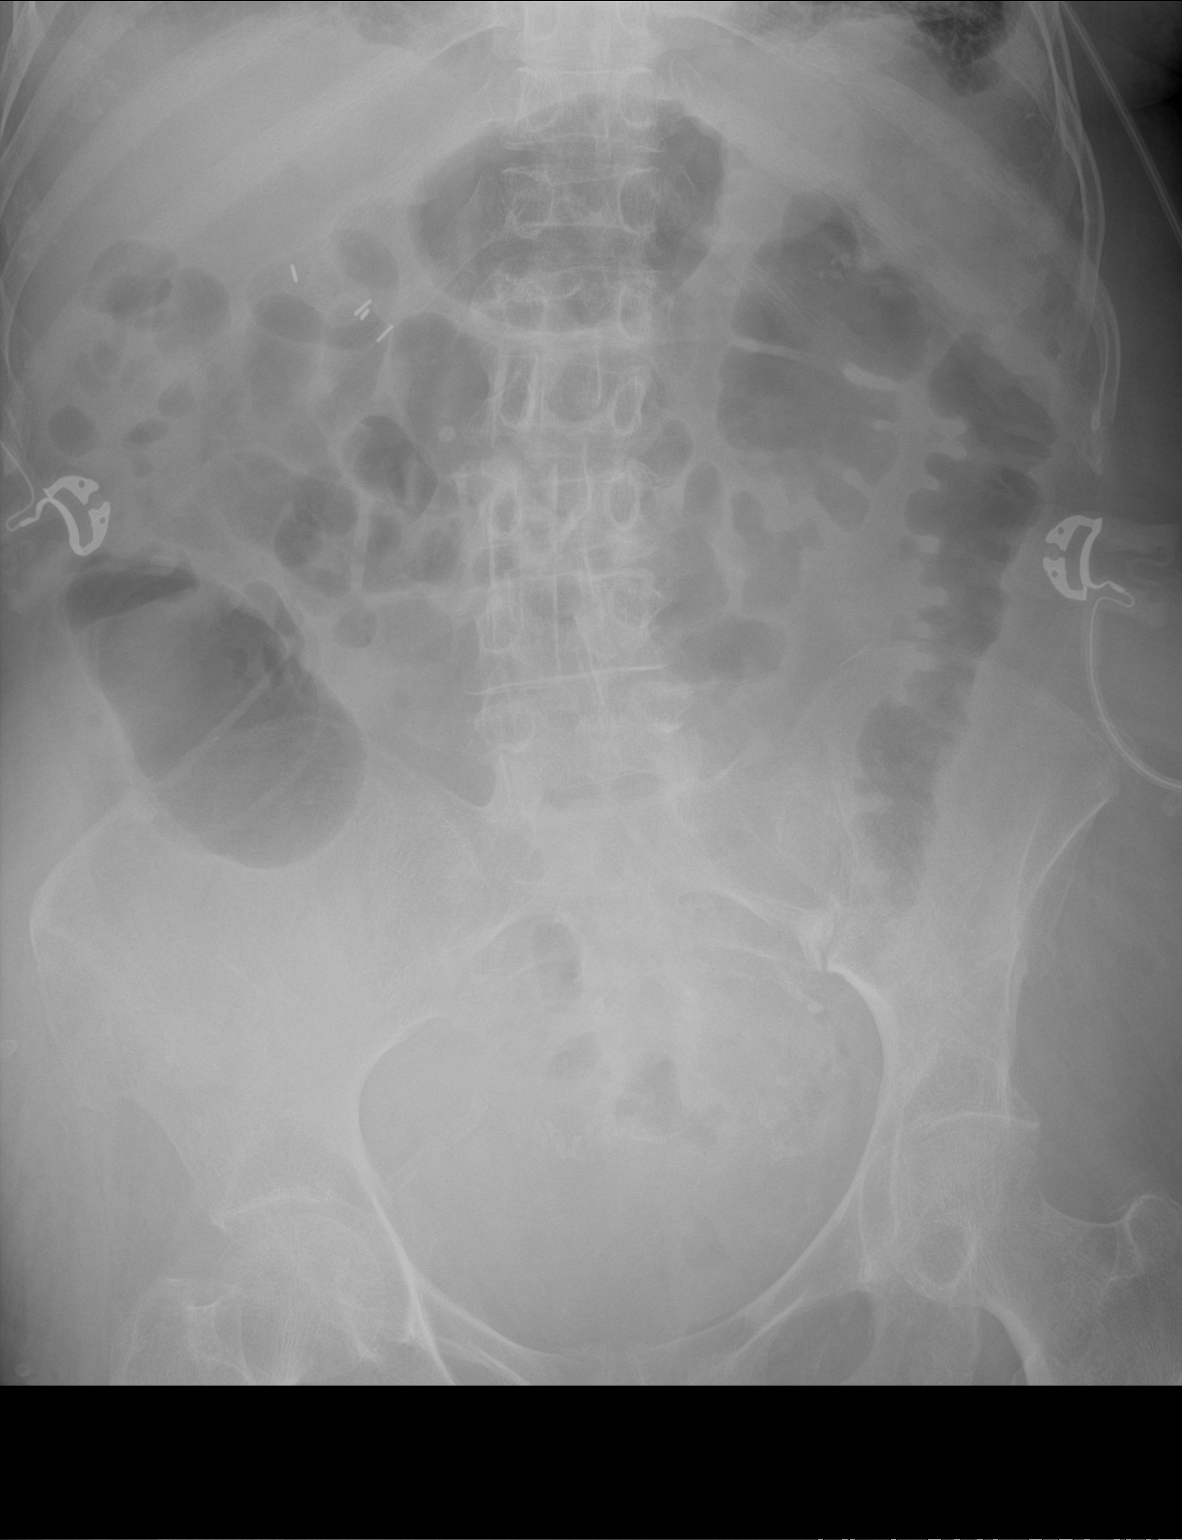

[2 of 2 positions shown; findings below may reference images not displayed]

FINDINGS: No small bowel dilatation is noted. Mildly dilated colon loop is
seen in right side of abdomen which most likely represents ileus.
Status post cholecystectomy. There is no evidence of free air. No
radio-opaque calculi or other significant radiographic abnormality
is seen.
IMPRESSION: Mildly dilated colonic loops seen in right side of abdomen which may
represent ileus.

## 2019-02-17 NOTE — Telephone Encounter (Signed)
Phone call placed to patient to follow up and to offer to schedule visit with Palliative. Patient requested a visit via phone. Schedule for 02/17/2019

## 2019-02-18 NOTE — Progress Notes (Signed)
[facility]:  PALLIATIVE [address] [place], [place]  [address]  NOTE:        Phoned patient multiple times prior to and at scheduled Telehealth visit with attempts to perform COVID screening for possible home visit or to complete Telehealth visit.  Unable to complete call with provided telephone number. (fast busy signal). Palliative care will continue to attempt to make contact with and reschedule Palliative consult visit.       Margaretha Sheffield, NP-C

## 2019-02-20 IMAGING — DX DG CHEST 1V PORT
1 series · 1 of 1 positions shown · non-contrast
Comparison: [DATE] [DATE], [DATE]; [DATE] [DATE], [DATE]

CLINICAL DATA: Difficulty breathing

EXAM:
PORTABLE CHEST 1 VIEW

[chest ap]
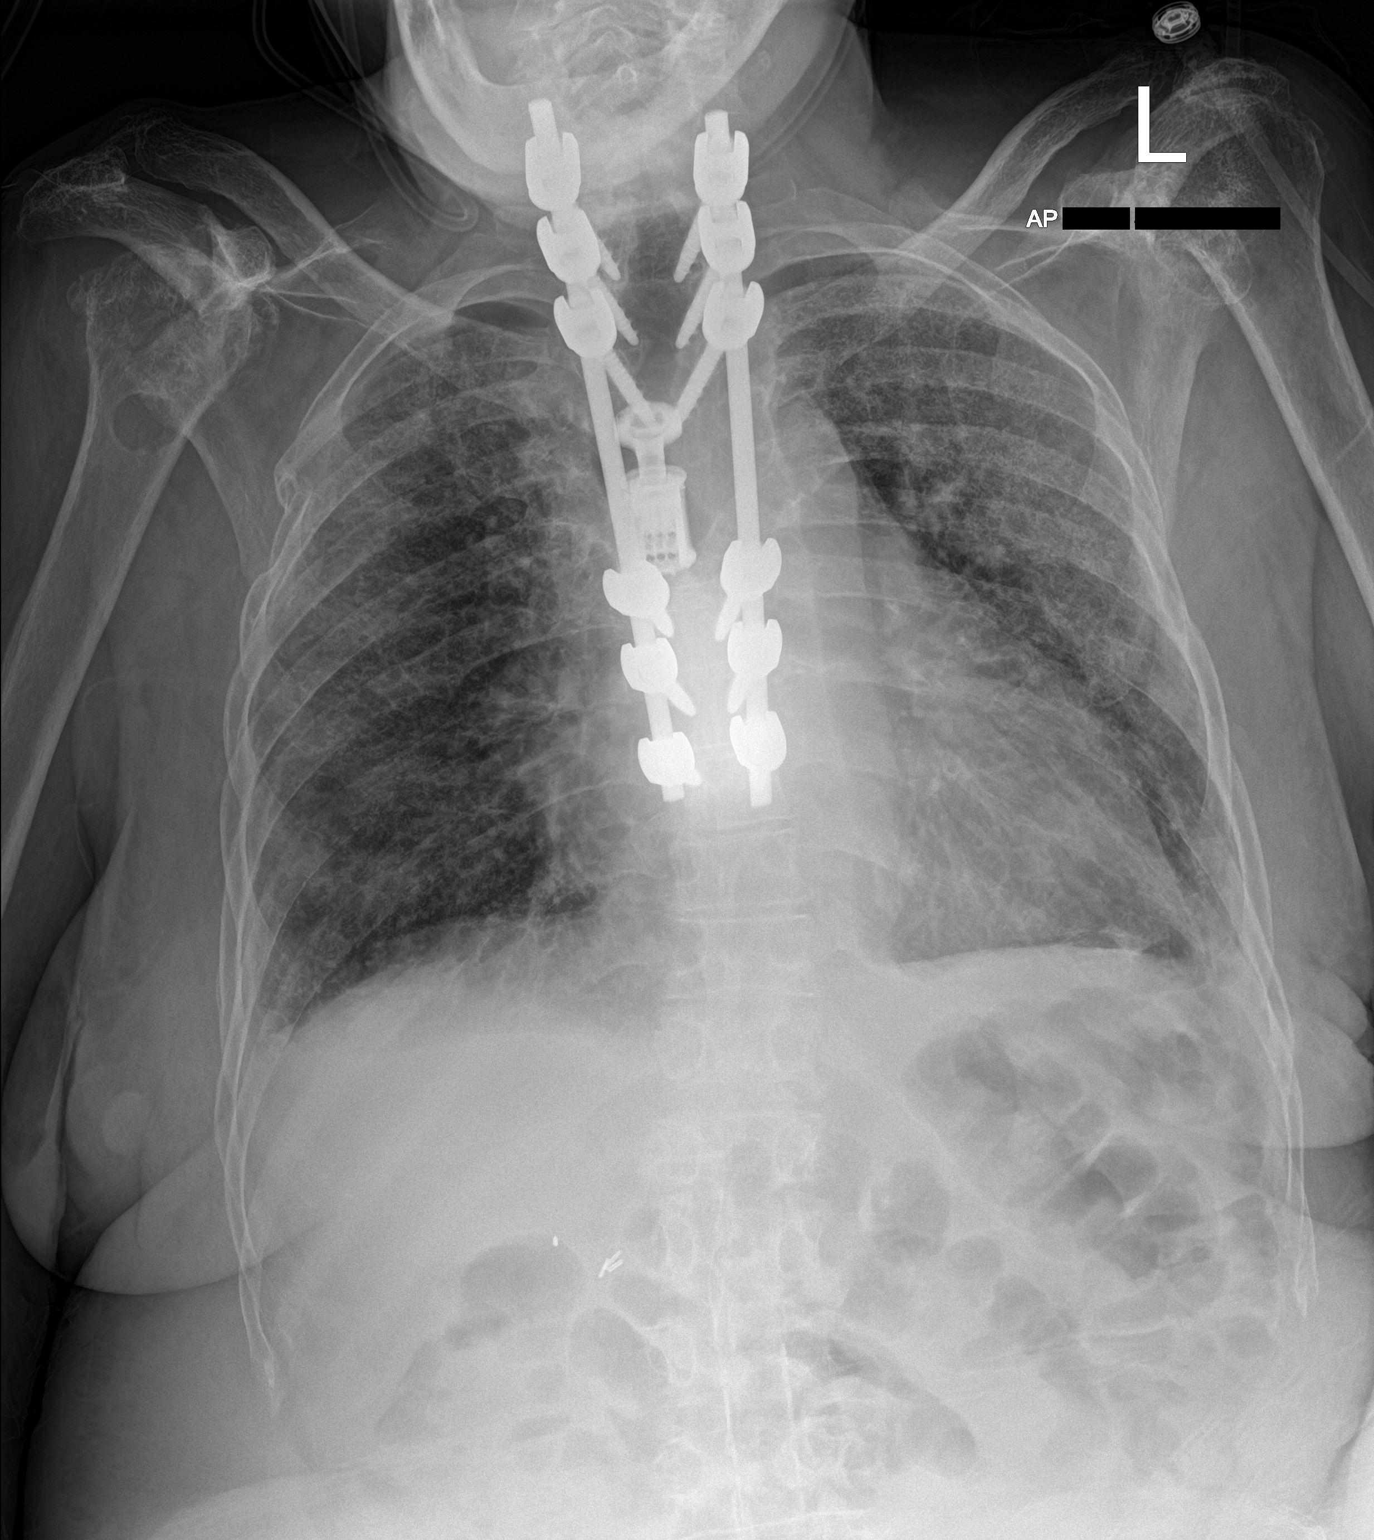

[1 of 1 positions shown; findings below may reference images not displayed]

FINDINGS: Diffuse interstitial thickening remains stable. There is no frank
edema consolidation. Heart is upper normal in size with pulmonary
vascularity normal. No adenopathy.

Extensive postoperative change in the thoracic spine noted. Evidence
of old right-sided rib trauma laterally. There is arthropathy in the
shoulders, stable. Cystic area in the proximal right humerus is
stable.
IMPRESSION: Diffuse interstitial thickening, likely reflecting chronic fibrosis.
No frank edema or consolidation evident. Stable cardiac silhouette.
Extensive postoperative change in the thoracic spine. Advanced
arthropathy in the shoulders. Prior rib trauma on the right.

## 2019-02-21 IMAGING — DX DG CHEST 1V PORT
1 series · 1 of 1 positions shown · non-contrast
Comparison: 01/04/2019 and earlier.

CLINICAL DATA: Five day history of shortness of breath. Acute mid
chest pain that began today.

EXAM:
PORTABLE CHEST 1 VIEW

[chest ap]
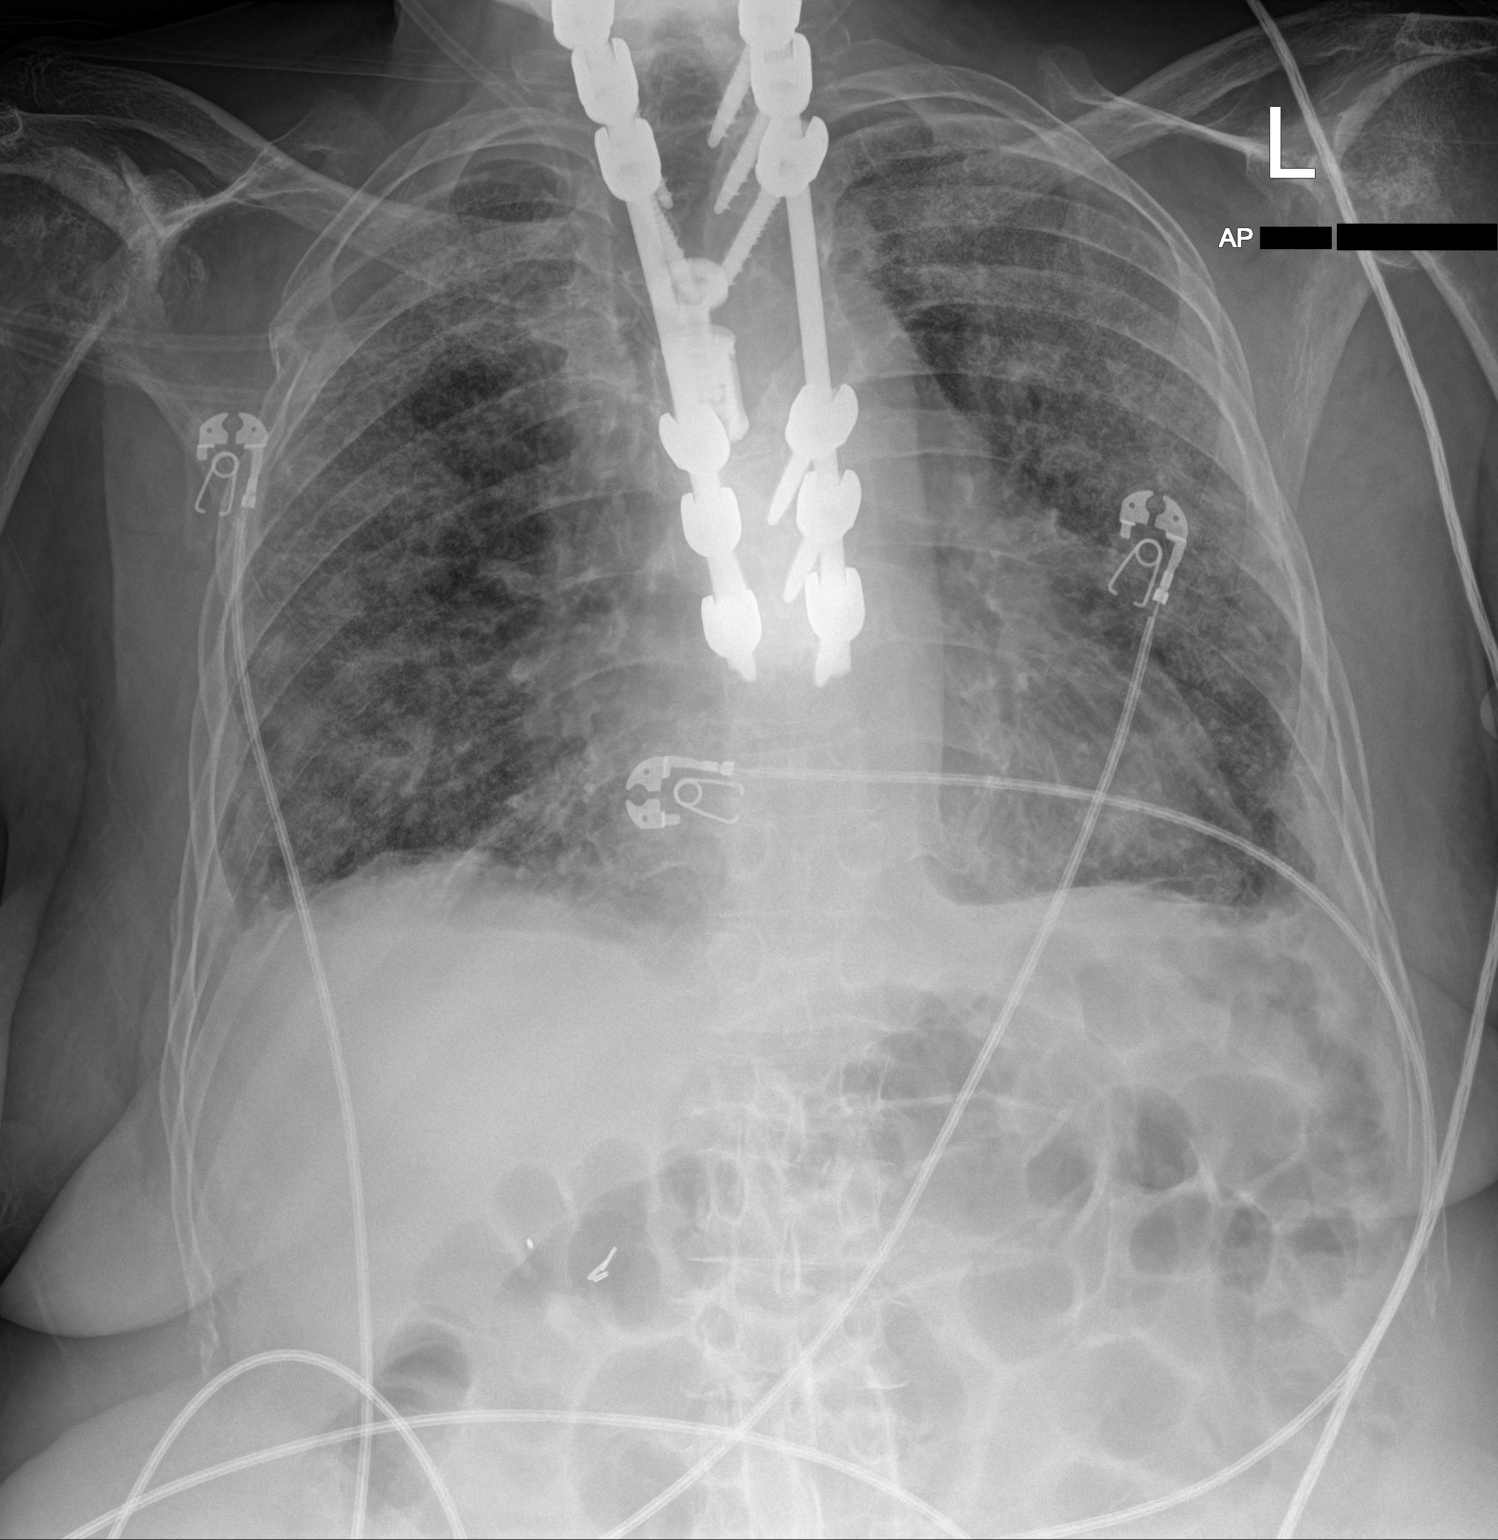

[1 of 1 positions shown; findings below may reference images not displayed]

FINDINGS: Cardiac silhouette moderately enlarged, unchanged. Increasing
interstitial opacities superimposed upon the previously identified
interstitial lung disease. Stable small BILATERAL pleural effusions.
Prior thoracic spine corpectomy and fusion. Lytic lesion in the
proximal RIGHT humerus. Severe degenerative changes involving both
shoulders.
IMPRESSION: 1. Developing interstitial pulmonary edema is suspected,
superimposed upon the previously identified interstitial lung
disease.
2. Stable small BILATERAL pleural effusions.
3. Lytic lesion involving the proximal RIGHT humerus. Does the
patient have a known malignancy or multiple myeloma?

## 2019-02-22 IMAGING — DX DG CHEST 1V PORT
1 series · 1 of 1 positions shown · non-contrast
Comparison: January 05, 2019

CLINICAL DATA: Cough and congestion.

EXAM:
PORTABLE CHEST 1 VIEW

[chest ap]
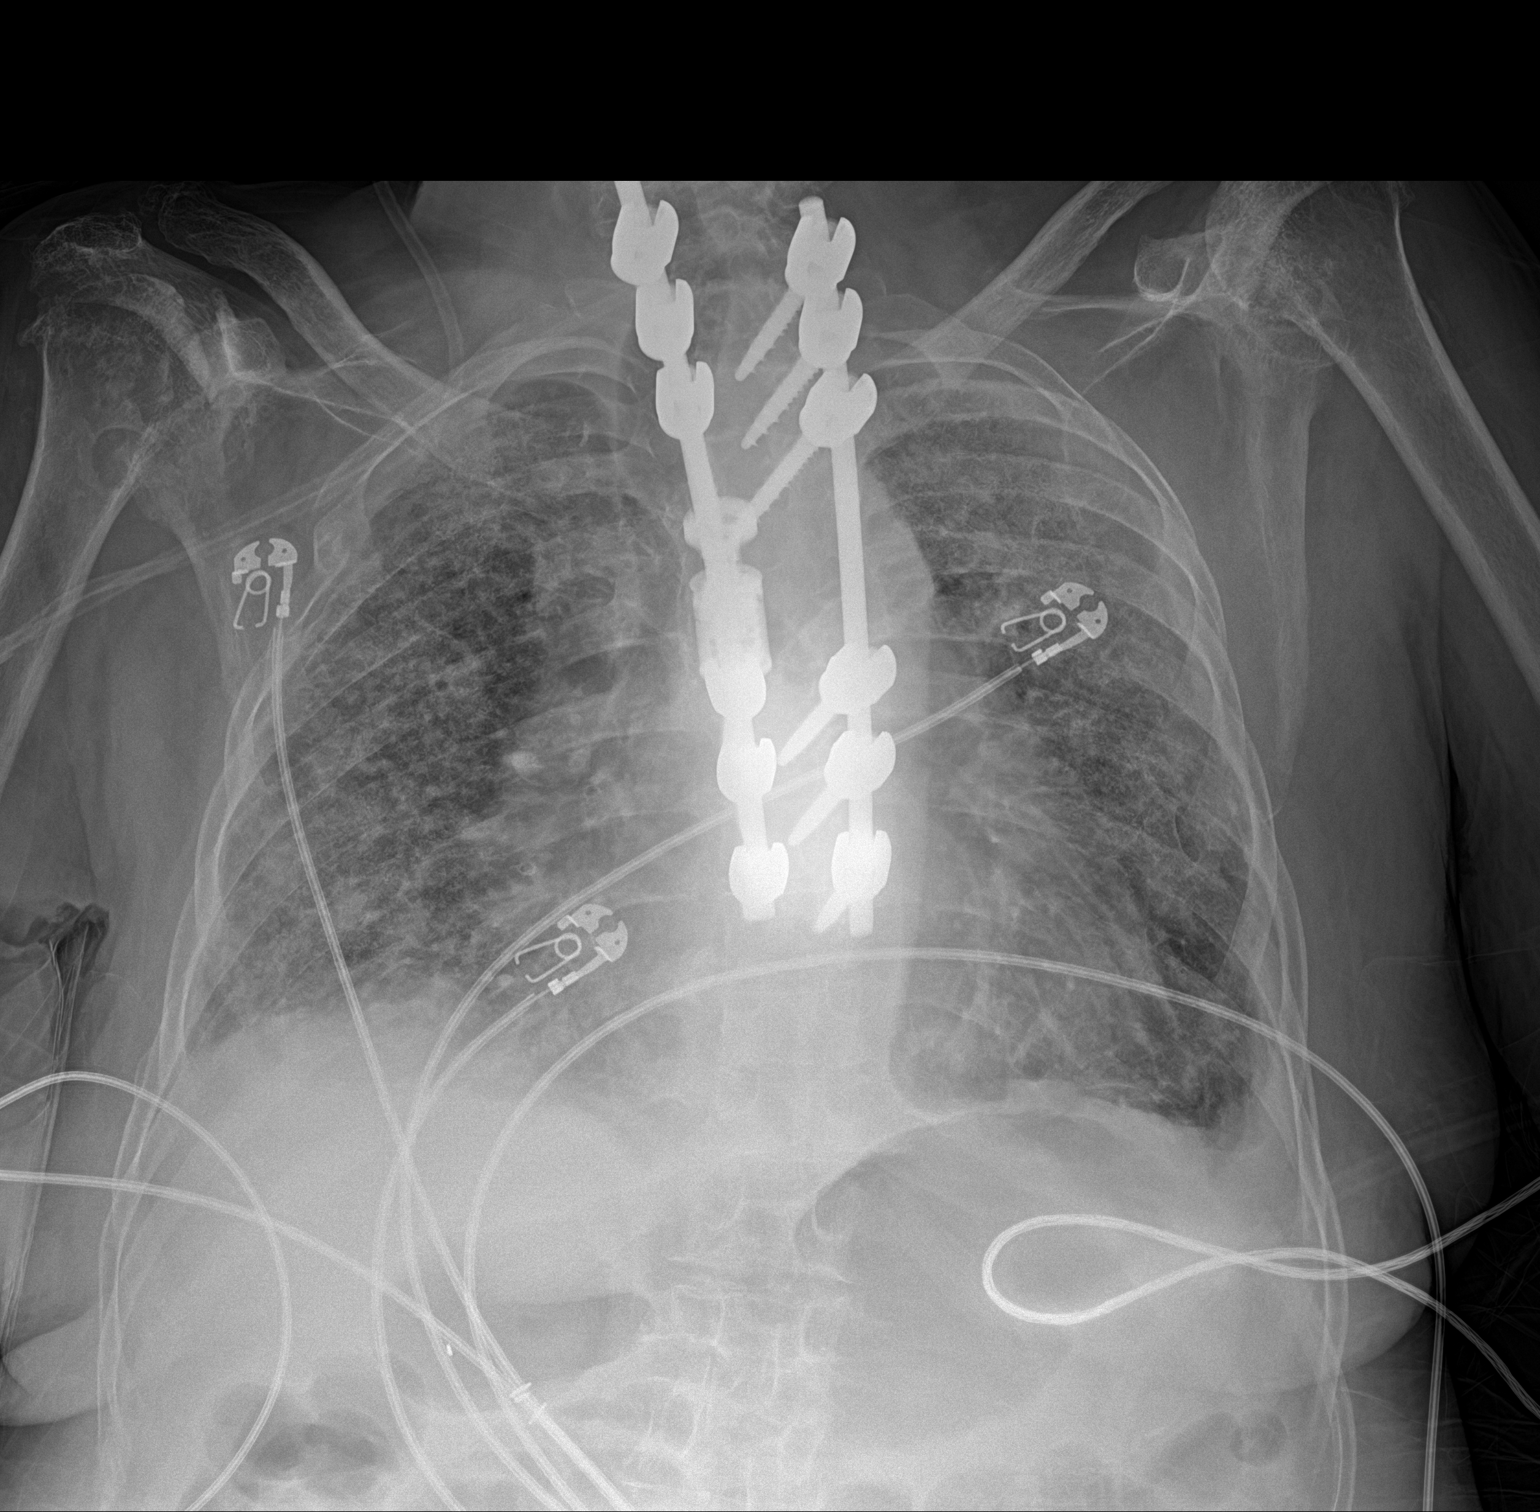

[1 of 1 positions shown; findings below may reference images not displayed]

FINDINGS: Stable cardiomegaly. Stable small pleural effusions. Increasing
pulmonary opacities suggestive of worsening edema. Persistent lytic
lesion in the proximal right humerus.
IMPRESSION: 1. Cardiomegaly and worsening pulmonary edema.  Small effusions.
2. Persistent lytic lesion in the proximal right humerus. Gout was
suggested on previous right shoulder x-rays.

## 2019-02-24 IMAGING — DX DG CHEST 1V PORT
1 series · 1 of 1 positions shown · non-contrast
Comparison: Portable exam 8058 hours compared to 01/06/2019

CLINICAL DATA: Shortness of breath, asthma, hypertension, history
of stroke, GERD, former smoker

EXAM:
PORTABLE CHEST 1 VIEW

[chest]
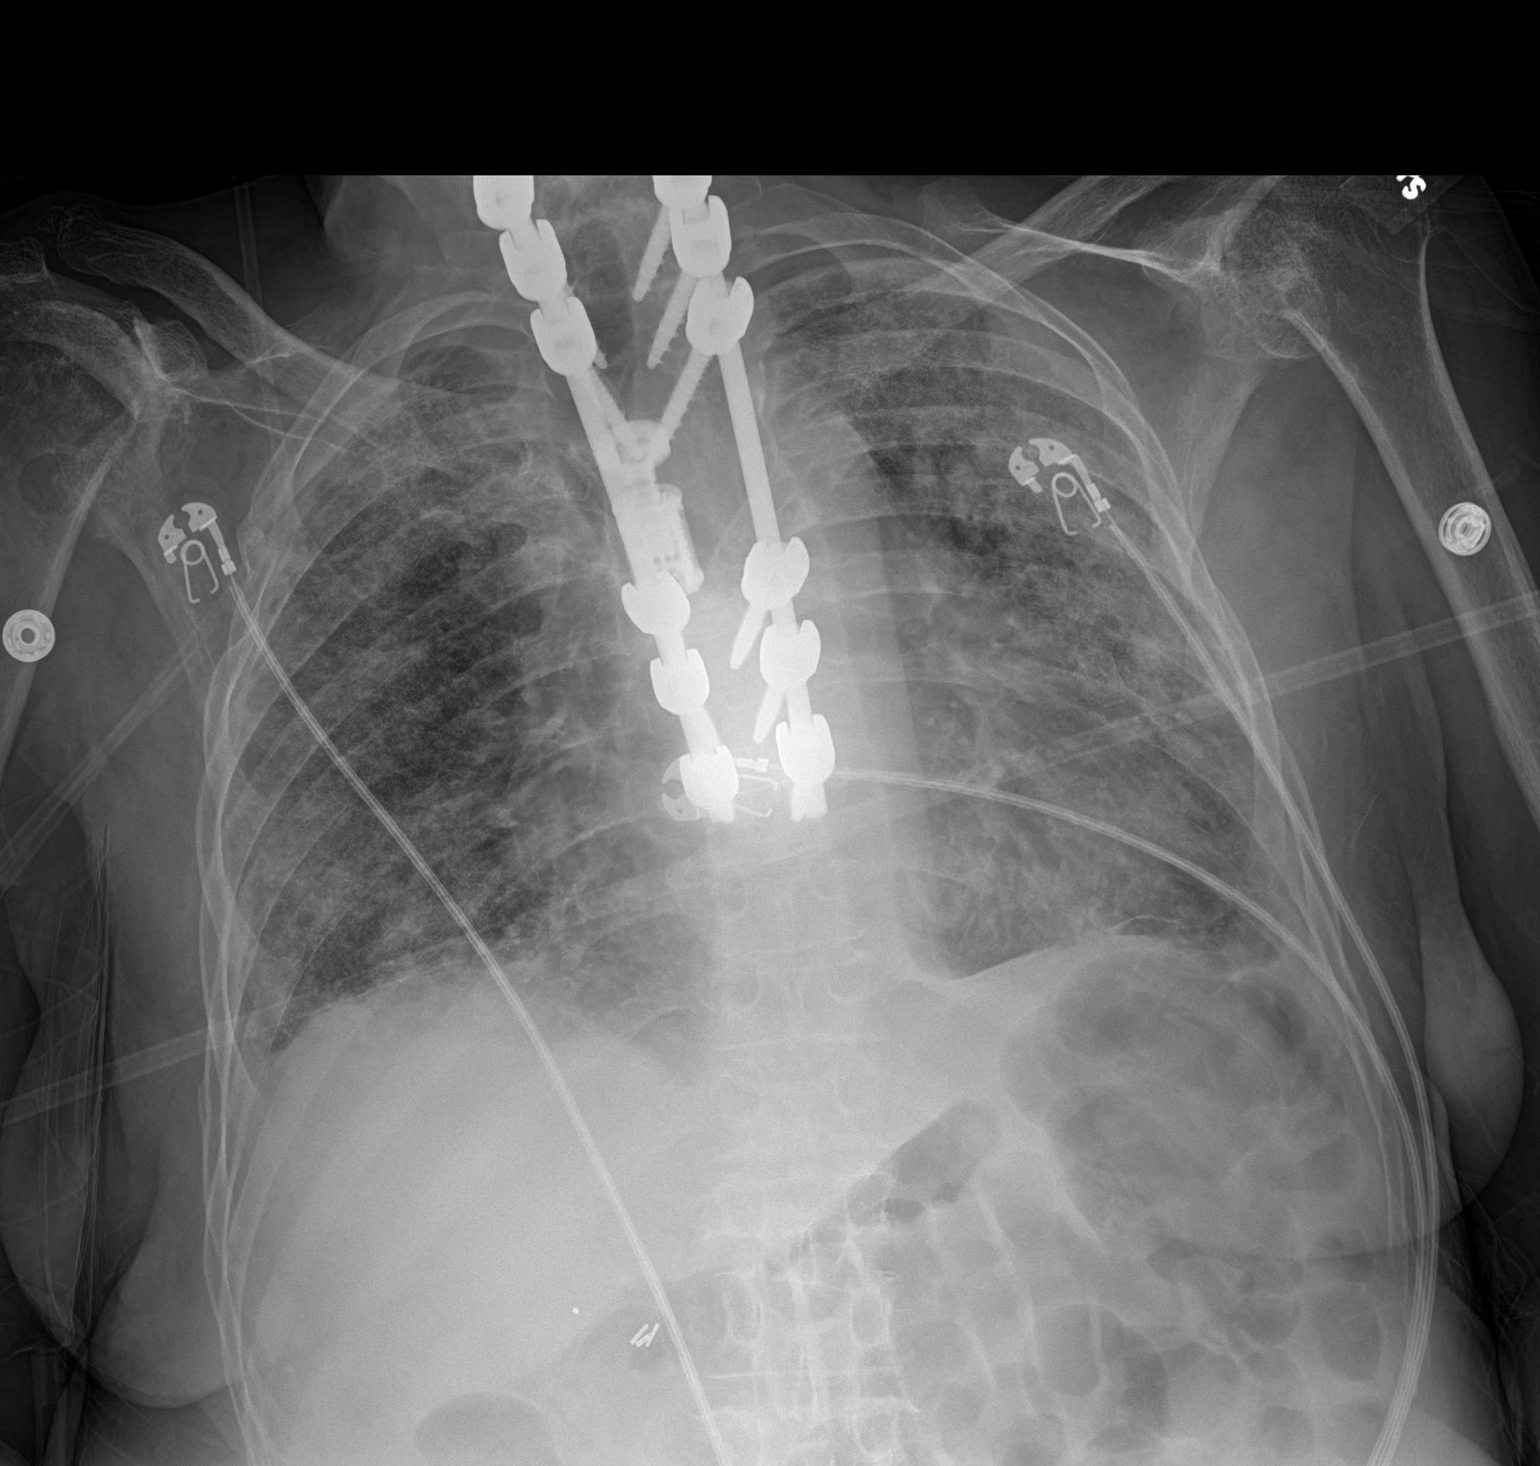

[1 of 1 positions shown; findings below may reference images not displayed]

FINDINGS: Enlargement of cardiac silhouette.

Stable mediastinal contours.

Diffuse BILATERAL pulmonary infiltrates question pulmonary edema
though infection is not excluded.

No gross pleural effusion or pneumothorax.

Bones demineralized with prior thoracic spinal fixation.

BILATERAL glenohumeral degenerative changes.

Old RIGHT rib fractures.

Chronic lytic lesion at proximal RIGHT humerus.
IMPRESSION: Enlargement of cardiac silhouette with peripheral cyst and diffuse
pulmonary infiltrates favoring pulmonary edema though infection is
not excluded.

No interval change.

## 2019-02-26 IMAGING — DX DG CHEST 1V PORT
1 series · 1 of 1 positions shown · non-contrast
Comparison: 01/08/2019

CLINICAL DATA: Short of breath.  Chest pain.  Weakness.

EXAM:
PORTABLE CHEST 1 VIEW

[chest]
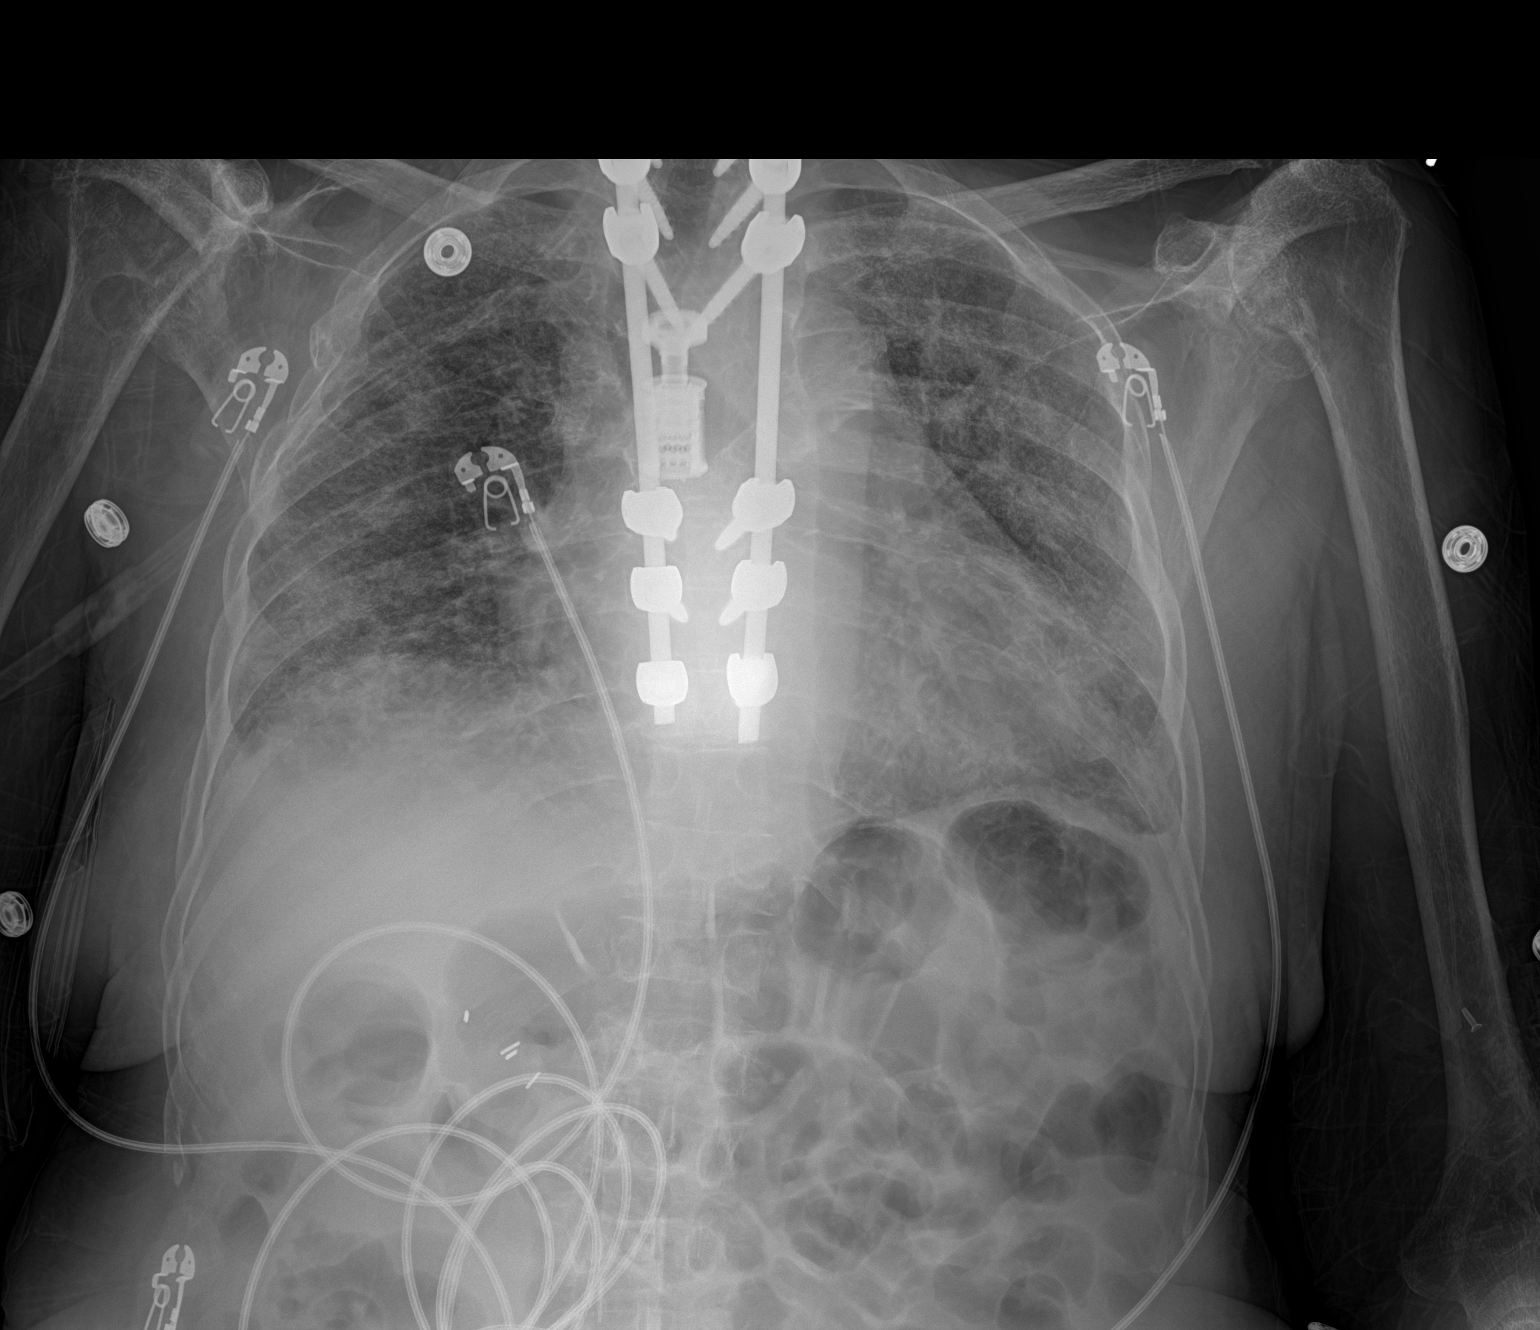

[1 of 1 positions shown; findings below may reference images not displayed]

FINDINGS: Mild cardiomegaly. Low volumes. Diffuse airspace opacities
throughout both lungs. Small pleural effusions. Spinal stabilization
hardware is stable. No pneumothorax or pleural effusion.
IMPRESSION: Stable diffuse bilateral airspace opacities.

## 2019-03-03 IMAGING — DX DG CHEST 2V
2 series · 2 of 2 positions shown · non-contrast
Comparison: 01/09/2019

CLINICAL DATA: Dyspnea.

EXAM:
CHEST - 2 VIEW

[chest lat]
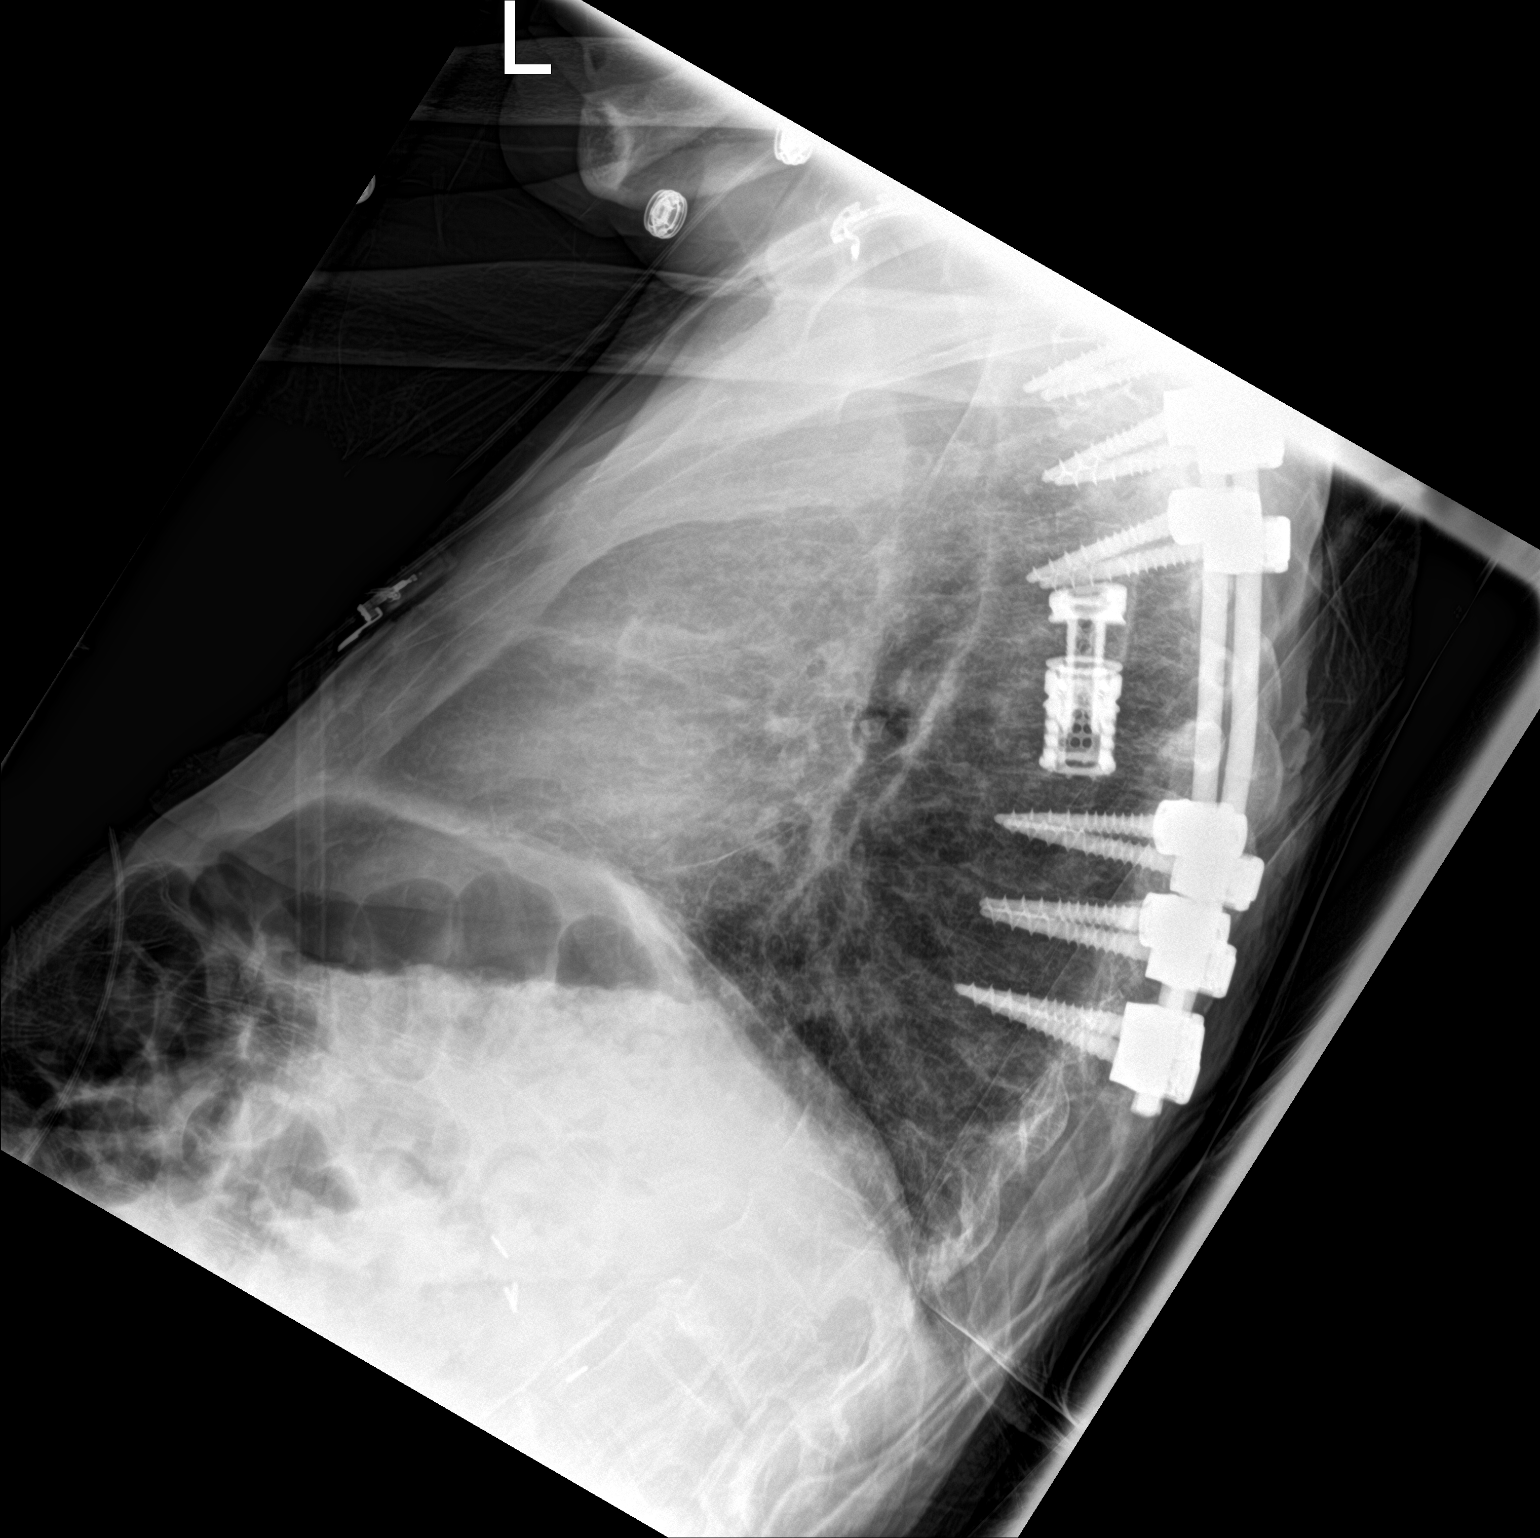

[chest ap]
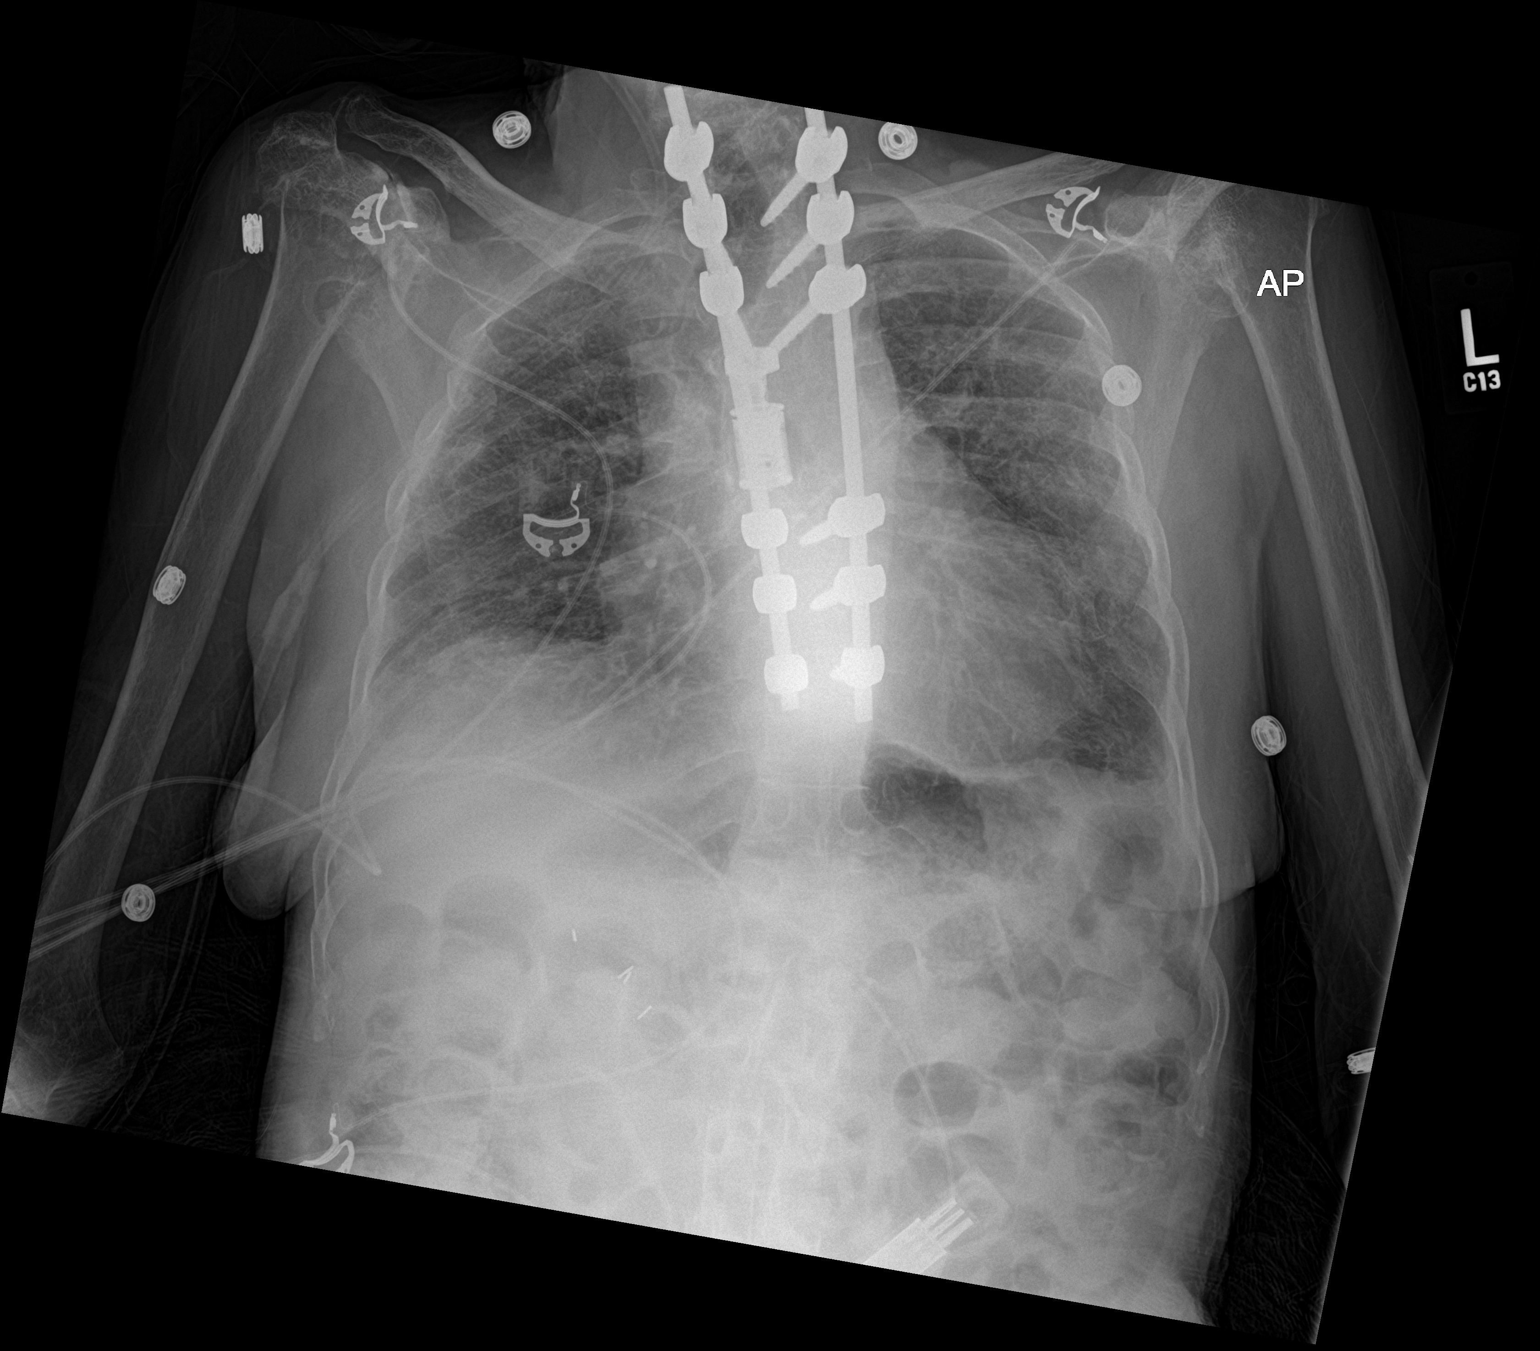

[2 of 2 positions shown; findings below may reference images not displayed]

FINDINGS: Stably enlarged cardiac silhouette. Mediastinal contours appear
intact.

There is no evidence of pleural effusion or pneumothorax. Improved
but not completely resolved linear peribronchial opacities
throughout both lungs. More nodular opacity seen in the mid right
lung field.

Osseous structures are without acute abnormality. Stable spinal
fusion. Soft tissues are grossly normal.
IMPRESSION: 1. Improved but not completely resolved linear peribronchial
opacities throughout both lungs.
2. More nodular opacity in the mid right lung field. This may
represent an area of airspace consolidation, however pulmonary
nodule cannot be excluded. Follow-up to resolution after empiric
treatment is recommended.

## 2019-07-23 ENCOUNTER — Telehealth: Payer: Self-pay

## 2019-07-23 NOTE — Telephone Encounter (Signed)
Number not in service.

## 2019-07-23 NOTE — Telephone Encounter (Signed)
Spoke with patient's daughter who provided updated phone number for patient: [phone]

## 2019-07-23 NOTE — Telephone Encounter (Signed)
Phone call placed to patient. No answer and VM not set up

## 2019-07-23 NOTE — Telephone Encounter (Signed)
Re attempted to contact patient to schedule visit with Palliative. No answer, No VM set up

## 2019-07-23 NOTE — Telephone Encounter (Signed)
Phone call placed to patient, line busy

## 2019-07-23 NOTE — Telephone Encounter (Signed)
Phone call placed to patient's son. No answer, VM not set up

## 2019-12-24 ENCOUNTER — Encounter (HOSPITAL_BASED_OUTPATIENT_CLINIC_OR_DEPARTMENT_OTHER): Payer: Self-pay | Admitting: *Deleted

## 2019-12-24 ENCOUNTER — Emergency Department (HOSPITAL_BASED_OUTPATIENT_CLINIC_OR_DEPARTMENT_OTHER): Payer: Medicaid Other

## 2019-12-24 ENCOUNTER — Other Ambulatory Visit: Payer: Self-pay

## 2019-12-24 ENCOUNTER — Inpatient Hospital Stay (HOSPITAL_BASED_OUTPATIENT_CLINIC_OR_DEPARTMENT_OTHER)
Admission: EM | Admit: 2019-12-24 | Discharge: 2019-12-31 | DRG: 064 | Disposition: A | Discharge status: Home Health | Payer: Medicaid Other | Attending: Student | Admitting: Student

## 2019-12-24 DIAGNOSIS — T426X5A Adverse effect of other antiepileptic and sedative-hypnotic drugs, initial encounter: Secondary | ICD-10-CM | POA: Diagnosis present

## 2019-12-24 DIAGNOSIS — R4781 Slurred speech: Secondary | ICD-10-CM | POA: Diagnosis present

## 2019-12-24 DIAGNOSIS — E7220 Disorder of urea cycle metabolism, unspecified: Secondary | ICD-10-CM | POA: Diagnosis present

## 2019-12-24 DIAGNOSIS — D649 Anemia, unspecified: Secondary | ICD-10-CM | POA: Diagnosis present

## 2019-12-24 DIAGNOSIS — Z7951 Long term (current) use of inhaled steroids: Secondary | ICD-10-CM

## 2019-12-24 DIAGNOSIS — I5032 Chronic diastolic (congestive) heart failure: Secondary | ICD-10-CM | POA: Diagnosis present

## 2019-12-24 DIAGNOSIS — R109 Unspecified abdominal pain: Secondary | ICD-10-CM

## 2019-12-24 DIAGNOSIS — Z841 Family history of disorders of kidney and ureter: Secondary | ICD-10-CM

## 2019-12-24 DIAGNOSIS — Z8249 Family history of ischemic heart disease and other diseases of the circulatory system: Secondary | ICD-10-CM

## 2019-12-24 DIAGNOSIS — M549 Dorsalgia, unspecified: Secondary | ICD-10-CM | POA: Diagnosis present

## 2019-12-24 DIAGNOSIS — K8689 Other specified diseases of pancreas: Secondary | ICD-10-CM | POA: Diagnosis present

## 2019-12-24 DIAGNOSIS — G894 Chronic pain syndrome: Secondary | ICD-10-CM | POA: Diagnosis present

## 2019-12-24 DIAGNOSIS — Z888 Allergy status to other drugs, medicaments and biological substances status: Secondary | ICD-10-CM

## 2019-12-24 DIAGNOSIS — Z86718 Personal history of other venous thrombosis and embolism: Secondary | ICD-10-CM

## 2019-12-24 DIAGNOSIS — J45909 Unspecified asthma, uncomplicated: Secondary | ICD-10-CM | POA: Diagnosis present

## 2019-12-24 DIAGNOSIS — Z7901 Long term (current) use of anticoagulants: Secondary | ICD-10-CM

## 2019-12-24 DIAGNOSIS — Z91041 Radiographic dye allergy status: Secondary | ICD-10-CM

## 2019-12-24 DIAGNOSIS — B370 Candidal stomatitis: Secondary | ICD-10-CM | POA: Diagnosis present

## 2019-12-24 DIAGNOSIS — Z87891 Personal history of nicotine dependence: Secondary | ICD-10-CM

## 2019-12-24 DIAGNOSIS — Z7952 Long term (current) use of systemic steroids: Secondary | ICD-10-CM

## 2019-12-24 DIAGNOSIS — R4701 Aphasia: Secondary | ICD-10-CM | POA: Diagnosis present

## 2019-12-24 DIAGNOSIS — M069 Rheumatoid arthritis, unspecified: Secondary | ICD-10-CM | POA: Diagnosis present

## 2019-12-24 DIAGNOSIS — R54 Age-related physical debility: Secondary | ICD-10-CM | POA: Diagnosis present

## 2019-12-24 DIAGNOSIS — Q211 Atrial septal defect: Secondary | ICD-10-CM

## 2019-12-24 DIAGNOSIS — T402X5A Adverse effect of other opioids, initial encounter: Secondary | ICD-10-CM | POA: Diagnosis present

## 2019-12-24 DIAGNOSIS — N390 Urinary tract infection, site not specified: Secondary | ICD-10-CM | POA: Diagnosis present

## 2019-12-24 DIAGNOSIS — Z79899 Other long term (current) drug therapy: Secondary | ICD-10-CM

## 2019-12-24 DIAGNOSIS — I110 Hypertensive heart disease with heart failure: Secondary | ICD-10-CM | POA: Diagnosis present

## 2019-12-24 DIAGNOSIS — T45516A Underdosing of anticoagulants, initial encounter: Secondary | ICD-10-CM | POA: Diagnosis present

## 2019-12-24 DIAGNOSIS — E119 Type 2 diabetes mellitus without complications: Secondary | ICD-10-CM | POA: Diagnosis present

## 2019-12-24 DIAGNOSIS — E785 Hyperlipidemia, unspecified: Secondary | ICD-10-CM | POA: Diagnosis present

## 2019-12-24 DIAGNOSIS — I6523 Occlusion and stenosis of bilateral carotid arteries: Secondary | ICD-10-CM | POA: Diagnosis present

## 2019-12-24 DIAGNOSIS — Y92009 Unspecified place in unspecified non-institutional (private) residence as the place of occurrence of the external cause: Secondary | ICD-10-CM

## 2019-12-24 DIAGNOSIS — I63411 Cerebral infarction due to embolism of right middle cerebral artery: Principal | ICD-10-CM | POA: Diagnosis present

## 2019-12-24 DIAGNOSIS — R200 Anesthesia of skin: Secondary | ICD-10-CM | POA: Diagnosis present

## 2019-12-24 DIAGNOSIS — Z20822 Contact with and (suspected) exposure to covid-19: Secondary | ICD-10-CM | POA: Diagnosis present

## 2019-12-24 DIAGNOSIS — G92 Toxic encephalopathy: Secondary | ICD-10-CM | POA: Diagnosis present

## 2019-12-24 DIAGNOSIS — N179 Acute kidney failure, unspecified: Secondary | ICD-10-CM | POA: Diagnosis present

## 2019-12-24 DIAGNOSIS — I639 Cerebral infarction, unspecified: Secondary | ICD-10-CM | POA: Diagnosis present

## 2019-12-24 DIAGNOSIS — Z8673 Personal history of transient ischemic attack (TIA), and cerebral infarction without residual deficits: Secondary | ICD-10-CM

## 2019-12-24 DIAGNOSIS — R471 Dysarthria and anarthria: Secondary | ICD-10-CM | POA: Diagnosis present

## 2019-12-24 DIAGNOSIS — R569 Unspecified convulsions: Secondary | ICD-10-CM | POA: Diagnosis present

## 2019-12-24 LAB — COMPREHENSIVE METABOLIC PANEL
ALT: 20 U/L (ref 0–44)
AST: 27 U/L (ref 15–41)
Albumin: 2.3 g/dL — ABNORMAL LOW (ref 3.5–5.0)
Alkaline Phosphatase: 102 U/L (ref 38–126)
Anion gap: 6 (ref 5–15)
BUN: 11 mg/dL (ref 8–23)
CO2: 20 mmol/L — ABNORMAL LOW (ref 22–32)
Calcium: 8.1 mg/dL — ABNORMAL LOW (ref 8.9–10.3)
Chloride: 110 mmol/L (ref 98–111)
Creatinine, Ser: 0.83 mg/dL (ref 0.44–1.00)
GFR calc Af Amer: >60 mL/min (ref >60)
GFR calc non Af Amer: >60 mL/min (ref >60)
Glucose, Bld: 99 mg/dL (ref 70–99)
Potassium: 3.7 mmol/L (ref 3.5–5.1)
Sodium: 136 mmol/L (ref 135–145)
Total Bilirubin: 0.5 mg/dL (ref 0.3–1.2)
Total Protein: 6 g/dL — ABNORMAL LOW (ref 6.5–8.1)

## 2019-12-24 LAB — CBC WITH DIFFERENTIAL/PLATELET
Abs Immature Granulocytes: 0.09 10*3/uL — ABNORMAL HIGH (ref 0.00–0.07)
Basophils Absolute: 0.1 10*3/uL (ref 0.0–0.1)
Basophils Relative: 0 %
Eosinophils Absolute: 0.2 10*3/uL (ref 0.0–0.5)
Eosinophils Relative: 1 %
HCT: 28.5 % — ABNORMAL LOW (ref 36.0–46.0)
Hemoglobin: 9.5 g/dL — ABNORMAL LOW (ref 12.0–15.0)
Immature Granulocytes: 1 %
Lymphocytes Relative: 48 %
Lymphs Abs: 6.3 10*3/uL — ABNORMAL HIGH (ref 0.7–4.0)
MCH: 26.5 pg (ref 26.0–34.0)
MCHC: 33.3 g/dL (ref 30.0–36.0)
MCV: 79.6 fL — ABNORMAL LOW (ref 80.0–100.0)
Monocytes Absolute: 0.9 10*3/uL (ref 0.1–1.0)
Monocytes Relative: 7 %
Neutro Abs: 5.6 10*3/uL (ref 1.7–7.7)
Neutrophils Relative %: 43 %
Platelets: 185 10*3/uL (ref 150–400)
RBC: 3.58 MIL/uL — ABNORMAL LOW (ref 3.87–5.11)
RDW: 17 % — ABNORMAL HIGH (ref 11.5–15.5)
WBC: 13.1 10*3/uL — ABNORMAL HIGH (ref 4.0–10.5)
nRBC: 0.3 % — ABNORMAL HIGH (ref 0.0–0.2)

## 2019-12-24 LAB — RESPIRATORY PANEL BY RT PCR (FLU A&B, COVID)
Influenza A by PCR: NEGATIVE
Influenza B by PCR: NEGATIVE
SARS Coronavirus 2 by RT PCR: NEGATIVE

## 2019-12-24 LAB — TROPONIN I (HIGH SENSITIVITY)
Troponin I (High Sensitivity): 11 ng/L (ref <18)
Troponin I (High Sensitivity): 11 ng/L (ref <18)

## 2019-12-24 MED ORDER — OXYCODONE HCL 5 MG PO TABS
10.0000 mg | ORAL_TABLET | Freq: Once | ORAL | Status: AC
  Administered 2019-12-24: 10 mg via ORAL
  Filled 2019-12-24: qty 2

## 2019-12-24 NOTE — ED Notes (Signed)
PT taken to scan per RN. Will aquire echocardiogram upon return.

## 2019-12-24 NOTE — ED Notes (Signed)
PT given meal, drink, and snacks per RN. PT stated she can feed self.

## 2019-12-24 NOTE — ED Provider Notes (Signed)
[facility] EMERGENCY DEPARTMENT Provider Note   CSN: [id number] Arrival date & time: 12/24/19  1811     History Chief Complaint  Patient presents with  . Generalized Body Aches    Amber [name] is a 64 y.o. female.   Presents to the emergency department with chief complaint increased confusion.  For the last 2 days, has felt increased confusion, occasionally having episodes of forgetfulness, occasionally having episodes of finding the right words.  Currently states that she does not feel this way.  Has also had intermittent left hand numbness.  Not currently feeling any numbness or weakness.  Denies any vision changes.  Has avoided walking due to concern for feeling unsteady.  No chest pain, no difficulty in breathing.  Has had some body aches and generalized weakness.   HPI     Past Medical History:  Diagnosis Date  . ACS (acute coronary syndrome) ([place]) 06/12/2015  . Acute on chronic blood loss anemia 11/25/2017  . Arthritis   . Asthma   . Cerebrovascular accident (CVA) due to embolism ([place]) 10/26/2016  . Chronic pain   . Constipation   . Diabetes mellitus without complication ([place])   . Duodenitis   . Embolic stroke ([place])   . Falls frequently   . Focal seizure ([place])   . GERD (gastroesophageal reflux disease)   . Hypertension   . Impacted fracture 02/15/2017   right glenoid  . Pneumonia 10/2015  . Seizures ([place])    last seizure March 2015  . Small bowel obstruction ([place]) 11/25/2017    Patient Active Problem List   Diagnosis Date Noted  . Chronic diastolic (congestive) heart failure ([place]) 02/05/2019  . CKD (chronic kidney disease) stage 3, GFR 30-59 ml/min 02/05/2019  . Chronic anticoagulation 02/05/2019  . Palliative care encounter   . Septic shock ([place]) 12/29/2018  . Pressure injury of skin 10/11/2018  . Sepsis due to pneumonia ([place]) 10/10/2018  . Failure to thrive in adult 10/10/2018  . 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 Anasarca 10/10/2018  .  HCAP (healthcare-associated pneumonia) 09/24/2018  . Acute metabolic encephalopathy 83/66/2947  . Metabolic acidosis 65/46/5035  . Mild persistent asthma 05/09/2018  . 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 Moderate malnutrition ([place]) 05/07/2018  . Paroxysmal atrial fibrillation with RVR ([place]) 05/07/2018  . Nausea & vomiting 05/06/2018  . Chronic anemia 05/06/2018  . PFO (patent foramen ovale) 02/15/2017  . History of CVA with residual deficit 02/15/2017  . Drug-induced hyperglycemia 02/15/2017  . Cerebrovascular accident (CVA) due to embolism ([place]) 10/26/2016  . Sepsis ([place]) 02/29/2016  . Abdominal pain 11/07/2015  . Constipation 11/07/2015  . Hypomagnesemia 08/25/2015  . Diabetes mellitus type 2, controlled ([place]) 07/27/2015  . Chronic back pain   . Frequent falls   . Hypertensive heart disease   . Pulmonary hypertension ([place])   . Gout   . Microcytic anemia 01/26/2015  . History of seizure 01/26/2015    Past Surgical History:  Procedure Laterality Date  . APPENDECTOMY    . BACK SURGERY    . CHOLECYSTECTOMY    . TEE WITHOUT CARDIOVERSION N/A 10/30/2016   Procedure: TRANSESOPHAGEAL ECHOCARDIOGRAM (TEE);  Surgeon: Dorothy Spark, MD;  Location: [facility] ENDOSCOPY;  Service: Cardiovascular;  Laterality: N/A;     OB History   No obstetric history on file.     Family History  Problem Relation Age of Onset  . Kidney failure Mother   . Hypertension Sister   . Hypertension Brother     Social History  Tobacco Use  . Smoking status: Former Smoker    Quit date: 2010    Years since quitting: 11.0  . Smokeless tobacco: Never Used  Substance Use Topics  . Alcohol use: No    Comment: admits to 2 drinks/week  . Drug use: No    Home Medications Prior to Admission medications   Medication Sig Start Date End Date Taking? Authorizing Provider  albuterol (PROVENTIL HFA;VENTOLIN HFA) 108 (90 Base) MCG/ACT inhaler Inhale 2 puffs into the lungs every 4 (four) hours as needed for wheezing or  shortness of breath. 01/16/19   Glade Lloyd, MD  allopurinol (ZYLOPRIM) 300 MG tablet Take 300 mg by mouth daily. 11/11/16   [provider]  apixaban (ELIQUIS) 2.5 MG TABS tablet Take 2.5 mg by mouth 2 (two) times daily.    [provider]  atorvastatin (LIPITOR) 10 MG tablet Take 10 mg by mouth daily at 6 PM.  08/13/15   [provider]  colestipol (COLESTID) 1 g tablet Take 1 g by mouth 2 (two) times daily.    [provider]  feeding supplement, ENSURE ENLIVE, (ENSURE ENLIVE) LIQD Take 237 mLs by mouth 2 (two) times daily between meals. 11/10/15   Catarina Hartshorn, MD  furosemide (LASIX) 40 MG tablet Take 1 tablet (40 mg total) by mouth 2 (two) times daily for 30 days. 01/16/19 02/15/19  Glade Lloyd, MD  metoprolol succinate (TOPROL-XL) 25 MG 24 hr tablet Take 1 tablet (25 mg total) by mouth daily. 10/15/18   Edsel Petrin, DO  Multiple Vitamin (MULTIVITAMIN WITH MINERALS) TABS tablet Take 1 tablet by mouth daily.    [provider]  pantoprazole (PROTONIX) 40 MG tablet Take 40 mg by mouth 2 (two) times daily. 08/21/18   [provider]  polyethylene glycol (MIRALAX / GLYCOLAX) packet Take 17 g by mouth daily. 01/03/19   Ghimire, Werner Lean, MD  potassium chloride SA (K-DUR,KLOR-CON) 20 MEQ tablet Take 1 tablet (20 mEq total) by mouth daily. 01/04/19   Ghimire, Werner Lean, MD  predniSONE (DELTASONE) 5 MG tablet Take 1 tablet (5 mg total) by mouth daily with breakfast. 01/16/19   Glade Lloyd, MD  senna (SENOKOT) 8.6 MG TABS tablet Take 2 tablets (17.2 mg total) by mouth at bedtime. 01/03/19   Ghimire, Werner Lean, MD  valproic acid (DEPAKENE) 250 MG capsule Take 500 mg by mouth 3 (three) times daily.    [provider]     Allergies    Ivp dye [iodinated diagnostic agents] and Metrizamide  Review of Systems   Review of Systems  Constitutional: Negative for chills and fever.  HENT: Negative for ear pain and sore throat.   Eyes:  Negative for pain and visual disturbance.  Respiratory: Negative for cough and shortness of breath.   Cardiovascular: Negative for chest pain and palpitations.  Gastrointestinal: Negative for abdominal pain and vomiting.  Genitourinary: Negative for dysuria and hematuria.  Musculoskeletal: Negative for arthralgias and back pain.  Skin: Negative for color change and rash.  Neurological: Positive for dizziness, speech difficulty and numbness. Negative for seizures and syncope.  All other systems reviewed and are negative.   Physical Exam Updated Vital Signs BP (!) 158/103 (BP Location: Right Arm)   Pulse 97   Temp 98.5 F (36.9 C) (Oral)   Resp 15   Ht 5' (1.524 m)   Wt 47.6 kg   SpO2 97%   BMI 20.51 kg/m   Physical Exam Vitals and nursing note reviewed.  Constitutional:  General: She is not in acute distress.    Appearance: She is well-developed.  HENT:     Head: Normocephalic and atraumatic.  Eyes:     Conjunctiva/sclera: Conjunctivae normal.  Cardiovascular:     Rate and Rhythm: Normal rate and regular rhythm.     Heart sounds: No murmur.  Pulmonary:     Effort: Pulmonary effort is normal. No respiratory distress.     Breath sounds: Normal breath sounds.  Abdominal:     Palpations: Abdomen is soft.     Tenderness: There is no abdominal tenderness.  Musculoskeletal:     Cervical back: Neck supple.  Skin:    General: Skin is warm and dry.  Neurological:     Mental Status: She is alert.     Comments: Alert, oriented x3, cranial nerves II through XII intact, speech is clear, 5 out of 5 strength in upper and lower extremities, sensation to light touch intact in upper and lower extremities, normal FNF     ED Results / Procedures / Treatments   Labs (all labs ordered are listed, but only abnormal results are displayed) Labs Reviewed  CBC WITH DIFFERENTIAL/PLATELET - Abnormal; Notable for the following components:      Result Value   WBC 13.1 (*)    RBC 3.58  (*)    Hemoglobin 9.5 (*)    HCT 28.5 (*)    MCV 79.6 (*)    RDW 17.0 (*)    nRBC 0.3 (*)    Lymphs Abs 6.3 (*)    Abs Immature Granulocytes 0.09 (*)    All other components within normal limits  COMPREHENSIVE METABOLIC PANEL - Abnormal; Notable for the following components:   CO2 20 (*)    Calcium 8.1 (*)    Total Protein 6.0 (*)    Albumin 2.3 (*)    All other components within normal limits  RESPIRATORY PANEL BY RT PCR (FLU A&B, COVID)  URINALYSIS, ROUTINE W REFLEX MICROSCOPIC  TROPONIN I (HIGH SENSITIVITY)  TROPONIN I (HIGH SENSITIVITY)    EKG EKG Interpretation  Date/Time:  Wednesday December 24 2019 19:09:48 EST Ventricular Rate:  97 PR Interval:    QRS Duration: 71 QT Interval:  354 QTC Calculation: 450 R Axis:   34 Text Interpretation: Sinus rhythm Short PR interval LVH w/ repol abnormalities, possible ischemia when compared to prior ecg, T wave inversion in anterior leads are new Confirmed by Marianna Fuss ([id number]) on 12/24/2019 7:14:05 PM   Radiology CT Head Wo Contrast  Result Date: 12/24/2019 CLINICAL DATA:  Confusion EXAM: CT HEAD WITHOUT CONTRAST TECHNIQUE: Contiguous axial images were obtained from the base of the skull through the vertex without intravenous contrast. COMPARISON:  10/10/2018 FINDINGS: Brain: Chronic atrophic changes are noted. 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 There is a new area of wedge like decreased attenuation identified in the posterior aspect of the right parietal lobe consistent with subacute ischemia. No other findings to suggest acute infarct are noted. Scattered chronic white matter ischemic changes are seen. Vascular: No hyperdense vessel or unexpected calcification. Skull: Normal. Negative for fracture or focal lesion. Sinuses/Orbits: No acute finding. Other: None. IMPRESSION: Chronic atrophic and ischemic changes. New area of decreased attenuation in the posterior parietal lobe consistent with subacute ischemia. This would  correspond with the patient's recent history. Electronically Signed   By: Alcide Clever M.D.   On: 12/24/2019 19:06   DG Chest Portable 1 View  Result Date: 12/24/2019 CLINICAL DATA:  Increased confusion EXAM: PORTABLE CHEST  1 VIEW COMPARISON:  01/15/2019 FINDINGS: Cardiac shadow is enlarged but stable. Postsurgical changes are noted in the cervicothoracic spine. The lungs are clear bilaterally. No focal infiltrate or sizable effusion is seen. Old rib fractures are noted on the right with healing. IMPRESSION: No acute abnormality noted. Electronically Signed   By: Alcide Clever M.D.   On: 12/24/2019 19:07    Procedures .Critical Care Performed by: Milagros Loll, MD Authorized by: Milagros Loll, MD   Critical care provider statement:    Critical care time (minutes):  35   Critical care was necessary to treat or prevent imminent or life-threatening deterioration of the following conditions:  CNS failure or compromise   Critical care was time spent personally by me on the following activities:  Discussions with consultants, evaluation of patient's response to treatment, examination of patient, ordering and performing treatments and interventions, ordering and review of laboratory studies, ordering and review of radiographic studies, pulse oximetry, re-evaluation of patient's condition, obtaining history from patient or surrogate and review of old charts   (including critical care time)  Medications Ordered in ED Medications - No data to display  ED Course  I have reviewed the triage vital signs and the nursing notes.  Pertinent labs & imaging results that were available during my care of the patient were reviewed by me and considered in my medical decision making (see chart for details).  Clinical Course as of Dec 24 2111  Wed Dec 24, 2019  2108 D/w Amada Jupiter - stroke - needs hosp admission, will see in consultation   [RD]    Clinical Course User Index [RD] Milagros Loll,  MD   MDM Rules/Calculators/A&P                      64 year old lady with past medical history of paroxysmal A. fib, prior CVA on [facility] presenting to ER with new onset forgetfulness, confusion.  Here patient is neuro intact, no focal deficits on my exam.  Given the reported acute onset, concern for possible stroke.  CT head consistent with subacute stroke which matches timeline.  Discussed with neurology who agrees to this was likely a stroke.  Recommended transfer to [facility], he will see in consultation, hospitalist to admit.   Final Clinical Impression(s) / ED Diagnoses Final diagnoses:  Cerebrovascular accident (CVA), unspecified mechanism [facility])    Rx / DC Orders ED Discharge Orders    None       Milagros Loll, MD 12/24/19 2114

## 2019-12-24 NOTE — ED Notes (Signed)
Patient soiled brief of urine; changed brief and cleaned patient. Patient tolerated well.

## 2019-12-24 NOTE — H&P (Signed)
64 yo F with h/o A.Fib, past stroke, presents with confusion.  Looks like she has subacute stroke on CT scan today.  Will put in for tele bed.  Amada Jupiter will see.

## 2019-12-24 NOTE — ED Triage Notes (Signed)
Per family increased confusion, sore all over, drooling,  Light sensitivity x 2 days

## 2019-12-25 ENCOUNTER — Inpatient Hospital Stay (HOSPITAL_COMMUNITY): Payer: Medicaid Other

## 2019-12-25 DIAGNOSIS — I6389 Other cerebral infarction: Secondary | ICD-10-CM | POA: Diagnosis not present

## 2019-12-25 DIAGNOSIS — G92 Toxic encephalopathy: Secondary | ICD-10-CM | POA: Diagnosis present

## 2019-12-25 DIAGNOSIS — E119 Type 2 diabetes mellitus without complications: Secondary | ICD-10-CM | POA: Diagnosis present

## 2019-12-25 DIAGNOSIS — I639 Cerebral infarction, unspecified: Secondary | ICD-10-CM

## 2019-12-25 DIAGNOSIS — Z86718 Personal history of other venous thrombosis and embolism: Secondary | ICD-10-CM | POA: Diagnosis not present

## 2019-12-25 DIAGNOSIS — E785 Hyperlipidemia, unspecified: Secondary | ICD-10-CM | POA: Diagnosis present

## 2019-12-25 DIAGNOSIS — T402X5A Adverse effect of other opioids, initial encounter: Secondary | ICD-10-CM | POA: Diagnosis present

## 2019-12-25 DIAGNOSIS — I6523 Occlusion and stenosis of bilateral carotid arteries: Secondary | ICD-10-CM | POA: Diagnosis present

## 2019-12-25 DIAGNOSIS — G894 Chronic pain syndrome: Secondary | ICD-10-CM | POA: Diagnosis present

## 2019-12-25 DIAGNOSIS — K8689 Other specified diseases of pancreas: Secondary | ICD-10-CM | POA: Diagnosis not present

## 2019-12-25 DIAGNOSIS — D649 Anemia, unspecified: Secondary | ICD-10-CM | POA: Diagnosis present

## 2019-12-25 DIAGNOSIS — R471 Dysarthria and anarthria: Secondary | ICD-10-CM | POA: Diagnosis present

## 2019-12-25 DIAGNOSIS — Z20822 Contact with and (suspected) exposure to covid-19: Secondary | ICD-10-CM | POA: Diagnosis present

## 2019-12-25 DIAGNOSIS — Y92009 Unspecified place in unspecified non-institutional (private) residence as the place of occurrence of the external cause: Secondary | ICD-10-CM | POA: Diagnosis not present

## 2019-12-25 DIAGNOSIS — Z79899 Other long term (current) drug therapy: Secondary | ICD-10-CM | POA: Diagnosis not present

## 2019-12-25 DIAGNOSIS — Z87891 Personal history of nicotine dependence: Secondary | ICD-10-CM | POA: Diagnosis not present

## 2019-12-25 DIAGNOSIS — E7220 Disorder of urea cycle metabolism, unspecified: Secondary | ICD-10-CM | POA: Diagnosis present

## 2019-12-25 DIAGNOSIS — B370 Candidal stomatitis: Secondary | ICD-10-CM | POA: Diagnosis present

## 2019-12-25 DIAGNOSIS — Z7901 Long term (current) use of anticoagulants: Secondary | ICD-10-CM | POA: Diagnosis not present

## 2019-12-25 DIAGNOSIS — J45909 Unspecified asthma, uncomplicated: Secondary | ICD-10-CM | POA: Diagnosis present

## 2019-12-25 DIAGNOSIS — R109 Unspecified abdominal pain: Secondary | ICD-10-CM | POA: Diagnosis not present

## 2019-12-25 DIAGNOSIS — N179 Acute kidney failure, unspecified: Secondary | ICD-10-CM | POA: Diagnosis present

## 2019-12-25 DIAGNOSIS — G8929 Other chronic pain: Secondary | ICD-10-CM | POA: Diagnosis not present

## 2019-12-25 DIAGNOSIS — I110 Hypertensive heart disease with heart failure: Secondary | ICD-10-CM | POA: Diagnosis present

## 2019-12-25 DIAGNOSIS — M069 Rheumatoid arthritis, unspecified: Secondary | ICD-10-CM | POA: Diagnosis present

## 2019-12-25 DIAGNOSIS — I63411 Cerebral infarction due to embolism of right middle cerebral artery: Secondary | ICD-10-CM | POA: Diagnosis present

## 2019-12-25 DIAGNOSIS — R4701 Aphasia: Secondary | ICD-10-CM | POA: Diagnosis present

## 2019-12-25 DIAGNOSIS — Q211 Atrial septal defect: Secondary | ICD-10-CM | POA: Diagnosis not present

## 2019-12-25 DIAGNOSIS — Z515 Encounter for palliative care: Secondary | ICD-10-CM | POA: Diagnosis not present

## 2019-12-25 DIAGNOSIS — R5381 Other malaise: Secondary | ICD-10-CM | POA: Diagnosis not present

## 2019-12-25 DIAGNOSIS — I5032 Chronic diastolic (congestive) heart failure: Secondary | ICD-10-CM | POA: Diagnosis present

## 2019-12-25 DIAGNOSIS — Z7189 Other specified counseling: Secondary | ICD-10-CM | POA: Diagnosis not present

## 2019-12-25 DIAGNOSIS — N390 Urinary tract infection, site not specified: Secondary | ICD-10-CM | POA: Diagnosis present

## 2019-12-25 LAB — CBC
HCT: 24.3 % — ABNORMAL LOW (ref 36.0–46.0)
Hemoglobin: 8.3 g/dL — ABNORMAL LOW (ref 12.0–15.0)
MCH: 26.7 pg (ref 26.0–34.0)
MCHC: 34.2 g/dL (ref 30.0–36.0)
MCV: 78.1 fL — ABNORMAL LOW (ref 80.0–100.0)
Platelets: 163 10*3/uL (ref 150–400)
RBC: 3.11 MIL/uL — ABNORMAL LOW (ref 3.87–5.11)
RDW: 16.7 % — ABNORMAL HIGH (ref 11.5–15.5)
WBC: 11.1 10*3/uL — ABNORMAL HIGH (ref 4.0–10.5)
nRBC: 0 % (ref 0.0–0.2)

## 2019-12-25 LAB — URINALYSIS, ROUTINE W REFLEX MICROSCOPIC
Bilirubin Urine: NEGATIVE
Glucose, UA: NEGATIVE mg/dL
Hgb urine dipstick: NEGATIVE
Ketones, ur: NEGATIVE mg/dL
Nitrite: NEGATIVE
Protein, ur: NEGATIVE mg/dL
Specific Gravity, Urine: 1.015 (ref 1.005–1.030)
WBC, UA: >50 WBC/hpf — ABNORMAL HIGH (ref 0–5)
pH: 6 (ref 5.0–8.0)

## 2019-12-25 LAB — BASIC METABOLIC PANEL
Anion gap: 7 (ref 5–15)
BUN: 9 mg/dL (ref 8–23)
CO2: 20 mmol/L — ABNORMAL LOW (ref 22–32)
Calcium: 7.9 mg/dL — ABNORMAL LOW (ref 8.9–10.3)
Chloride: 114 mmol/L — ABNORMAL HIGH (ref 98–111)
Creatinine, Ser: 0.78 mg/dL (ref 0.44–1.00)
GFR calc Af Amer: >60 mL/min (ref >60)
GFR calc non Af Amer: >60 mL/min (ref >60)
Glucose, Bld: 87 mg/dL (ref 70–99)
Potassium: 3.8 mmol/L (ref 3.5–5.1)
Sodium: 141 mmol/L (ref 135–145)

## 2019-12-25 LAB — RAPID URINE DRUG SCREEN, HOSP PERFORMED
Amphetamines: NOT DETECTED
Barbiturates: NOT DETECTED
Benzodiazepines: NOT DETECTED
Cocaine: NOT DETECTED
Opiates: POSITIVE — AB
Tetrahydrocannabinol: NOT DETECTED

## 2019-12-25 LAB — LIPID PANEL
Cholesterol: 120 mg/dL (ref 0–200)
HDL: 52 mg/dL (ref >40)
LDL Cholesterol: 46 mg/dL (ref 0–99)
Total CHOL/HDL Ratio: 2.3 RATIO
Triglycerides: 111 mg/dL (ref <150)
VLDL: 22 mg/dL (ref 0–40)

## 2019-12-25 LAB — HIV ANTIBODY (ROUTINE TESTING W REFLEX): HIV Screen 4th Generation wRfx: NONREACTIVE

## 2019-12-25 LAB — ECHOCARDIOGRAM COMPLETE
Height: 60 in
Weight: 1680 oz

## 2019-12-25 MED ORDER — METOPROLOL SUCCINATE ER 25 MG PO TB24
25.0000 mg | ORAL_TABLET | Freq: Every day | ORAL | Status: DC
  Administered 2019-12-25 – 2019-12-30 (×5): 25 mg via ORAL
  Filled 2019-12-25 (×6): qty 1

## 2019-12-25 MED ORDER — OXYCODONE HCL 5 MG PO TABS
20.0000 mg | ORAL_TABLET | ORAL | Status: DC | PRN
  Administered 2019-12-25 – 2019-12-26 (×6): 20 mg via ORAL
  Filled 2019-12-25 (×8): qty 4

## 2019-12-25 MED ORDER — ACETAMINOPHEN 160 MG/5ML PO SOLN: 650.0000 mg | ORAL | Status: DC | PRN

## 2019-12-25 MED ORDER — STROKE: EARLY STAGES OF RECOVERY BOOK
Freq: Once | Status: AC
  Filled 2019-12-25: qty 1

## 2019-12-25 MED ORDER — ALBUTEROL SULFATE (2.5 MG/3ML) 0.083% IN NEBU: 3.0000 mL | INHALATION_SOLUTION | RESPIRATORY_TRACT | Status: DC | PRN

## 2019-12-25 MED ORDER — COLESTIPOL HCL 1 G PO TABS
1.0000 g | ORAL_TABLET | Freq: Two times a day (BID) | ORAL | Status: DC
  Administered 2019-12-25 – 2019-12-30 (×12): 1 g via ORAL
  Filled 2019-12-25 (×13): qty 1

## 2019-12-25 MED ORDER — VALPROIC ACID 250 MG PO CAPS
500.0000 mg | ORAL_CAPSULE | Freq: Three times a day (TID) | ORAL | Status: DC
  Administered 2019-12-25 – 2019-12-29 (×14): 500 mg via ORAL
  Filled 2019-12-25 (×15): qty 2

## 2019-12-25 MED ORDER — FUROSEMIDE 40 MG PO TABS
40.0000 mg | ORAL_TABLET | Freq: Two times a day (BID) | ORAL | Status: DC
  Administered 2019-12-25 – 2019-12-26 (×4): 40 mg via ORAL
  Filled 2019-12-25 (×5): qty 1

## 2019-12-25 MED ORDER — NYSTATIN 100000 UNIT/ML MT SUSP
5.0000 mL | Freq: Four times a day (QID) | OROMUCOSAL | Status: DC
  Administered 2019-12-25 – 2019-12-30 (×24): 500000 [IU] via ORAL
  Filled 2019-12-25 (×21): qty 5

## 2019-12-25 MED ORDER — ALLOPURINOL 100 MG PO TABS
300.0000 mg | ORAL_TABLET | Freq: Every day | ORAL | Status: DC
  Administered 2019-12-25 – 2019-12-30 (×5): 300 mg via ORAL
  Filled 2019-12-25 (×6): qty 3

## 2019-12-25 MED ORDER — ACETAMINOPHEN 650 MG RE SUPP: 650.0000 mg | RECTAL | Status: DC | PRN

## 2019-12-25 MED ORDER — ACETAMINOPHEN 325 MG PO TABS
650.0000 mg | ORAL_TABLET | ORAL | Status: DC | PRN
  Filled 2019-12-25: qty 2

## 2019-12-25 MED ORDER — SENNOSIDES-DOCUSATE SODIUM 8.6-50 MG PO TABS: 1.0000 | ORAL_TABLET | Freq: Every evening | ORAL | Status: DC | PRN

## 2019-12-25 MED ORDER — PANCRELIPASE (LIP-PROT-AMYL) 36000-114000 UNITS PO CPEP
36000.0000 [IU] | ORAL_CAPSULE | Freq: Three times a day (TID) | ORAL | Status: DC
  Administered 2019-12-25 – 2019-12-30 (×17): 36000 [IU] via ORAL
  Filled 2019-12-25 (×20): qty 1

## 2019-12-25 MED ORDER — PANTOPRAZOLE SODIUM 40 MG PO TBEC
40.0000 mg | DELAYED_RELEASE_TABLET | Freq: Two times a day (BID) | ORAL | Status: DC
  Administered 2019-12-25 – 2019-12-30 (×12): 40 mg via ORAL
  Filled 2019-12-25 (×13): qty 1

## 2019-12-25 MED ORDER — ATORVASTATIN CALCIUM 10 MG PO TABS
10.0000 mg | ORAL_TABLET | Freq: Every day | ORAL | Status: DC
  Administered 2019-12-25 – 2019-12-30 (×6): 10 mg via ORAL
  Filled 2019-12-25 (×7): qty 1

## 2019-12-25 MED ORDER — SODIUM CHLORIDE 0.9 % IV SOLN
1.0000 g | INTRAVENOUS | Status: DC
  Administered 2019-12-25 – 2019-12-27 (×3): 1 g via INTRAVENOUS
  Filled 2019-12-25 (×3): qty 10

## 2019-12-25 MED ORDER — PERFLUTREN LIPID MICROSPHERE
1.0000 mL | INTRAVENOUS | Status: AC | PRN
  Administered 2019-12-25: 2 mL via INTRAVENOUS
  Filled 2019-12-25: qty 10

## 2019-12-25 MED ORDER — APIXABAN 2.5 MG PO TABS
2.5000 mg | ORAL_TABLET | Freq: Two times a day (BID) | ORAL | Status: DC
  Administered 2019-12-25 – 2019-12-30 (×12): 2.5 mg via ORAL
  Filled 2019-12-25 (×13): qty 1

## 2019-12-25 MED ORDER — ENSURE ENLIVE PO LIQD
237.0000 mL | Freq: Two times a day (BID) | ORAL | Status: DC
  Administered 2019-12-25 – 2019-12-30 (×9): 237 mL via ORAL

## 2019-12-25 MED ORDER — PREDNISONE 5 MG PO TABS
5.0000 mg | ORAL_TABLET | Freq: Every day | ORAL | Status: DC
  Administered 2019-12-25 – 2019-12-26 (×2): 5 mg via ORAL
  Filled 2019-12-25 (×3): qty 1

## 2019-12-25 MED ORDER — POLYETHYLENE GLYCOL 3350 17 G PO PACK
17.0000 g | PACK | Freq: Every day | ORAL | Status: DC
  Administered 2019-12-25 – 2019-12-26 (×2): 17 g via ORAL
  Filled 2019-12-25 (×3): qty 1

## 2019-12-25 NOTE — Progress Notes (Signed)
PT Cancellation Note  Patient Details Name: Amber [name] MRN: [id number] DOB: [birth year]   Cancelled Treatment:    Reason Eval/Treat Not Completed: Other (comment)(per RN hold at this time as pt awaiting transport for CT)   [place] B Oviya Ammar 12/25/2019, 10:00 AM Merryl Hacker, PT Acute Rehabilitation Services Pager: [phone] Office: [phone]

## 2019-12-25 NOTE — Progress Notes (Signed)
SLP Cancellation Note  Patient Details Name: Tacie Mccuistion MRN: [id number] DOB: 1956-03-09   Cancelled treatment:       Reason Eval/Treat Not Completed: Patient at procedure or test/unavailable(Pt currently with RN. SLP will follow up. )  Katalaya Beel I. Vear Clock, MS, CCC-SLP Acute Rehabilitation Services Office number [phone] Pager [phone]  Scheryl Marten 12/25/2019, 10:00 AM

## 2019-12-25 NOTE — Progress Notes (Signed)
Admitted from [facility] via ambulance.  Neurologist and [facility] paged.  Oriented to room and surroundings.  C/O back pain.  Does have chronic back pain and was given Oxy 10 mg po prior to leaving [facility].

## 2019-12-25 NOTE — Progress Notes (Signed)
STROKE TEAM PROGRESS NOTE   INTERVAL HISTORY I have personally reviewed history of presenting illness with the patient, electronic medical records and imaging films in PACS.  She presented with a few days history of confusion, forgetfulness and slurred speech and CT scan as well as MRI confirmed right posterior parietal embolic infarct adjacent to a previous old stroke.  She does have remote history of embolic strokes as well as recurrent upper extremity DVTs and known PFO and is on DVT prevention dose of Eliquis 2.5 twice daily but she admits that she had not been compliant in taking her Eliquis for several weeks.  Vitals:   12/25/19 0530 12/25/19 0641 12/25/19 0831 12/25/19 0853  BP: (!) 126/101 (!) 156/88 (!) 132/112 (!) 132/98  Pulse: 99 (!) 105 (!) 104 (!) 109  Resp: 18 18  18   Temp: 98.4 F (36.9 C) 98.5 F (36.9 C) 98.5 F (36.9 C) 97.6 F (36.4 C)  TempSrc: Oral  Oral   SpO2: 100% 100% 100% 100%  Weight:      Height:        CBC:  Recent Labs  Lab 12/24/19 1845 12/25/19 0359  WBC 13.1* 11.1*  NEUTROABS 5.6  --   HGB 9.5* 8.3*  HCT 28.5* 24.3*  MCV 79.6* 78.1*  PLT 185 163    Basic Metabolic Panel:  Recent Labs  Lab 12/24/19 1845 12/25/19 0359  NA 136 141  K 3.7 3.8  CL 110 114*  CO2 20* 20*  GLUCOSE 99 87  BUN 11 9  CREATININE 0.83 0.78  CALCIUM 8.1* 7.9*   Lipid Panel:     Component Value Date/Time   CHOL 120 12/25/2019 0359   TRIG 111 12/25/2019 0359   HDL 52 12/25/2019 0359   CHOLHDL 2.3 12/25/2019 0359   VLDL 22 12/25/2019 0359   LDLCALC 46 12/25/2019 0359   HgbA1c:  Lab Results  Component Value Date   HGBA1C 5.4 01/11/2019   Urine Drug Screen:     Component Value Date/Time   LABOPIA POSITIVE (A) 12/25/2019 0510   COCAINSCRNUR NONE DETECTED 12/25/2019 0510   LABBENZ NONE DETECTED 12/25/2019 0510   AMPHETMU NONE DETECTED 12/25/2019 0510   THCU NONE DETECTED 12/25/2019 0510   LABBARB NONE DETECTED 12/25/2019 0510    IMAGING past 48  hours CT Head Wo Contrast  Result Date: 12/24/2019 CLINICAL DATA:  Confusion EXAM: CT HEAD WITHOUT CONTRAST TECHNIQUE: Contiguous axial images were obtained from the base of the skull through the vertex without intravenous contrast. COMPARISON:  10/10/2018 FINDINGS: Brain: Chronic atrophic changes are noted. 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 There is a new area of wedge like decreased attenuation identified in the posterior aspect of the right parietal lobe consistent with subacute ischemia. No other findings to suggest acute infarct are noted. Scattered chronic white matter ischemic changes are seen. Vascular: No hyperdense vessel or unexpected calcification. Skull: Normal. Negative for fracture or focal lesion. Sinuses/Orbits: No acute finding. Other: None. IMPRESSION: Chronic atrophic and ischemic changes. New area of decreased attenuation in the posterior parietal lobe consistent with subacute ischemia. This would correspond with the patient's recent history. Electronically Signed   By: Alcide Clever M.D.   On: 12/24/2019 19:06   DG Chest Portable 1 View  Result Date: 12/24/2019 CLINICAL DATA:  Increased confusion EXAM: PORTABLE CHEST 1 VIEW COMPARISON:  01/15/2019 FINDINGS: Cardiac shadow is enlarged but stable. Postsurgical changes are noted in the cervicothoracic spine. The lungs are clear bilaterally. No focal infiltrate or sizable effusion  is seen. Old rib fractures are noted on the right with healing. IMPRESSION: No acute abnormality noted. Electronically Signed   By: Alcide Clever M.D.   On: 12/24/2019 19:07   ECHOCARDIOGRAM COMPLETE  Result Date: 12/25/2019   ECHOCARDIOGRAM REPORT   Patient Name:   Amber [name] Date of Exam: 12/25/2019 Medical Rec #:  [id number]      Height:       60.0 in Accession #:    [id number]     Weight:       105.0 lb Date of Birth:  [birth year]      BSA:          1.42 m Patient Age:    63 years       BP:           121/111 mmHg Patient Gender: F              HR:            101 bpm. Exam Location:  Inpatient Procedure: 2D Echo, Cardiac Doppler, Color Doppler and Intracardiac            Opacification Agent Indications:    Stroke 434.91  History:        Patient has prior history of Echocardiogram examinations, most                 recent 01/14/2019. Stroke and Pulmonary HTN; Risk                 Factors:Hypertension, Diabetes and Former Smoker. PFO.  Sonographer:    Tonia Ghent RDCS Referring Phys: [id number] STEPHEN A PETTY IMPRESSIONS  1. Left ventricular ejection fraction, by visual estimation, is 60 to 65%. The left ventricle has normal function. There is moderately increased left ventricular hypertrophy.  2. Definity contrast agent was given IV to delineate the left ventricular endocardial borders.  3. Left ventricular diastolic parameters are consistent with Grade I diastolic dysfunction (impaired relaxation).  4. The left ventricle has no regional wall motion abnormalities.  5. Global right ventricle has normal systolic function.The right ventricular size is normal. No increase in right ventricular wall thickness.  6. Left atrial size was normal.  7. Right atrial size was normal.  8. Mild mitral annular calcification.  9. Moderate calcification of the mitral valve leaflet(s). 10. Moderate thickening of the mitral valve leaflet(s). 11. The mitral valve is normal in structure. Trivial mitral valve regurgitation. No evidence of mitral stenosis. 12. The tricuspid valve is normal in structure. 13. The tricuspid valve is normal in structure. Tricuspid valve regurgitation is not demonstrated. 14. The aortic valve is tricuspid. Aortic valve regurgitation is not visualized. Mild aortic valve sclerosis without stenosis. 15. The pulmonic valve was normal in structure. Pulmonic valve regurgitation is not visualized. 16. The inferior vena cava is normal in size with greater than 50% respiratory variability, suggesting right atrial pressure of 3 mmHg. 17. The interatrial septum was not well  visualized. FINDINGS  Left Ventricle: Left ventricular ejection fraction, by visual estimation, is 60 to 65%. The left ventricle has normal function. Definity contrast agent was given IV to delineate the left ventricular endocardial borders. The left ventricle has no regional wall motion abnormalities. There is moderately increased left ventricular hypertrophy. Left ventricular diastolic parameters are consistent with Grade I diastolic dysfunction (impaired relaxation). Normal left atrial pressure. Right Ventricle: The right ventricular size is normal. No increase in right ventricular wall thickness. Global RV systolic function is has normal systolic function. Left Atrium:  Left atrial size was normal in size. Right Atrium: Right atrial size was normal in size Pericardium: There is no evidence of pericardial effusion. Mitral Valve: The mitral valve is normal in structure. There is moderate thickening of the mitral valve leaflet(s). There is moderate calcification of the mitral valve leaflet(s). Mild mitral annular calcification. Trivial mitral valve regurgitation. No evidence of mitral valve stenosis by observation. Tricuspid Valve: The tricuspid valve is normal in structure. Tricuspid valve regurgitation is not demonstrated. Aortic Valve: The aortic valve is tricuspid. Aortic valve regurgitation is not visualized. Mild aortic valve sclerosis is present, with no evidence of aortic valve stenosis. Pulmonic Valve: The pulmonic valve was normal in structure. Pulmonic valve regurgitation is not visualized. Pulmonic regurgitation is not visualized. Aorta: The aortic root, ascending aorta and aortic arch are all structurally normal, with no evidence of dilitation or obstruction. Venous: The inferior vena cava is normal in size with greater than 50% respiratory variability, suggesting right atrial pressure of 3 mmHg. IAS/Shunts: The interatrial septum was not well visualized. There is no evidence of a patent foramen ovale.  No ventricular septal defect is seen or detected. There is no evidence of an atrial septal defect.  LEFT VENTRICLE PLAX 2D LVIDd:         3.00 cm  Diastology LVIDs:         2.20 cm  LV e' lateral:   3.97 cm/s LV PW:         1.30 cm  LV E/e' lateral: 13.0 LV IVS:        1.30 cm  LV e' medial:    2.92 cm/s LVOT diam:     2.00 cm  LV E/e' medial:  17.7 LV SV:         19 ml LV SV Index:   13.23 LVOT Area:     3.14 cm  RIGHT VENTRICLE RV S prime:     9.65 cm/s TAPSE (M-mode): 1.3 cm LEFT ATRIUM             Index LA diam:        2.30 cm 1.62 cm/m LA Vol (A2C):   22.5 ml 15.85 ml/m LA Vol (A4C):   27.6 ml 19.44 ml/m LA Biplane Vol: 26.6 ml 18.74 ml/m  AORTIC VALVE LVOT Vmax:   84.20 cm/s LVOT Vmean:  54.000 cm/s LVOT VTI:    0.142 m  AORTA Ao Asc diam: 3.00 cm MITRAL VALVE MV Area (PHT): 5.13 cm             SHUNTS MV PHT:        42.92 msec           Systemic VTI:  0.14 m MV Decel Time: 148 msec             Systemic Diam: 2.00 cm MV E velocity: 51.80 cm/s 103 cm/s MV A velocity: 66.00 cm/s 70.3 cm/s MV E/A ratio:  0.78       1.5  Jenkins Rouge MD Electronically signed by Jenkins Rouge MD Signature Date/Time: 12/25/2019/10:26:14 AM    Final     PHYSICAL EXAM Pleasant middle-aged African-American lady not in distress. . Afebrile. Head is nontraumatic. Neck is supple without bruit.    Cardiac exam no murmur or gallop. Lungs are clear to auscultation. Distal pulses are well felt. Neurological Exam : Awake alert oriented x2.  Diminished attention, registration and recall.  Speech is clear without aphasia or apraxia.  Extraocular movements appear full range without nystagmus.  She  has left homonymous hemianopsia which as per patient is chronic.  Face is symmetric without weakness.  Tongue is midline.  Motor system exam shows mild left hemiparesis 4/5 but her cooperation is limited due to pain in her back.  Good strength on the right side.  Sensation appears mildly diminished on the left compared to the right.  Deep tendon  reflexes are brisker on the left compared to the right.  Plantars are equivocal. ASSESSMENT/PLAN Amber [name] is a 64 y.o. female with history of embolic strokes, PFO and UE DVTs on Eliquis presenting to [facility]  with increased confusion, forgetfulness, drooling.   Stroke:   New posterior R MCA infarct embolic secondary to unclear source. Suspicious for recurrent DVT given missed doses of Eliquis just prior to admission, known PFO. Workup underway  CT head  New posterior parietal subacute lobe infarct. Small vessel disease. Atrophy.   MRI  Moderate acute to early subacute posterior R MCA infarct. Extensive small vessel disease. Multiple chronic infarcts.   MRA  No LVO or significant stenosis   Carotid Doppler 40 to 59% left ICA and 1-39% right ICA stenosis upper extremity doppler pending    Lower extremity doppler pending    Transcranial doppler poor windows bilaterally.  Only ophthalmic arteries could be instrumented and have antegrade flow.  2D Echo EF 60-65%. No source of embolus   TEE in 2017 positive for PFO  LDL 46  HgbA1c pending   Eliquis for VTE prophylaxis  Eliquis (apixaban) daily 2.5 bid prior to admission for recurrent DVT, now on Eliquis (apixaban) daily 2.5 bid. Missed several (4) doses prior to admission. May need to increase dose if has an acute DVT.   Therapy recommendations:  pending   Disposition:   pending   Hypertension  Stable . Permissive hypertension (OK if < 220/120) but gradually normalize in 5-7 days . Long-term BP goal normotensive  Hyperlipidemia  Home meds:  lipitor 10, resumed in hospital  LDL 46, goal < 70  Will not do intensive statin given low LDL on lipitor 10  Continue statin at discharge  Diabetes type II Controlled  HgbA1c 5.4, goal < 7.0  Other Stroke Risk Factors  Former Cigarette smoker  Hx stroke/TIA  10/2016 numerous supra and infratentorial infarct across multiple territories. Largest in R  frontoparietal lobes. Embolic. Found to have PFO. Planned for PFO closure. Hypercoagulable workup neg x slightly elevated homocysteine. Later found to have UE DVT and placed on eliquis.    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 Had missed several doses prior to admission    Other Active Problems  UTI w/ abnormal UA. On Rocephin  Oral thrush  Chronic steroid use, 5 mg  Chronic back pain, on home opiates  Hospital day # 0  I have personally obtained history,examined this patient, reviewed notes, independently viewed imaging studies, participated in medical decision making and plan of care.ROS completed by me personally and pertinent positives fully documented  I have made any additions or clarifications directly to the above note.  She presented with a few day history of confusion and speech difficulty secondary to embolic right posterior parietal infarct.  She has recurrent history of DVTs and has not been compliant with her anticoagulation prophylactic regimen.  She also has a PFO which could have contributed to paradoxical embolism.  Recommend check lower extremity and upper extremity venous Dopplers for DVT and if present may need full dose anticoagulation.  Endovascular PFO closure was considered  in the past but not done but given her noncompliance history will not be a good idea.  Continue ongoing stroke work-up.  Patient counseled to be compliant with her medications.  Discussed with Dr. Dayna Barker.  Greater than 50% time during this 35-minute visit was spent on counseling and coordination of care about embolic stroke and discussion about DVT, PFO and medication compliance and answering questions  Delia Heady, MD Medical Director [facility] Pager: [phone] 12/25/2019 3:18 PM   To contact Stroke Continuity provider, please refer to [email or web address]. After hours, contact General Neurology

## 2019-12-25 NOTE — Evaluation (Signed)
Speech Language Pathology Evaluation Patient Details Name: Amber [name] MRN: [id number] DOB: June 09, 1956 Today's Date: 12/25/2019 Time: 9562-1308 SLP Time Calculation (min) (ACUTE ONLY): 20 min  Problem List:  Patient Active Problem List   Diagnosis Date Noted  . Stroke (HCC) 12/25/2019  . Acute ischemic stroke (HCC) 12/24/2019  . Chronic diastolic (congestive) heart failure (HCC) 02/05/2019  . CKD (chronic kidney disease) stage 3, GFR 30-59 ml/min 02/05/2019  . Chronic anticoagulation 02/05/2019  . Palliative care encounter   . Septic shock (HCC) 12/29/2018  . Pressure injury of skin 10/11/2018  . Sepsis due to pneumonia (HCC) 10/10/2018  . Failure to thrive in adult 10/10/2018  . 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 Anasarca 10/10/2018  . HCAP (healthcare-associated pneumonia) 09/24/2018  . Acute metabolic encephalopathy 05/09/2018  . Metabolic acidosis 05/09/2018  . Mild persistent asthma 05/09/2018  . 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 Moderate malnutrition (HCC) 05/07/2018  . Paroxysmal atrial fibrillation with RVR (HCC) 05/07/2018  . Nausea & vomiting 05/06/2018  . Chronic anemia 05/06/2018  . PFO (patent foramen ovale) 02/15/2017  . History of CVA with residual deficit 02/15/2017  . Drug-induced hyperglycemia 02/15/2017  . 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 Sepsis (HCC) 02/29/2016  . Abdominal pain 11/07/2015  . Constipation 11/07/2015  . Hypomagnesemia 08/25/2015  . Diabetes mellitus type 2, controlled (HCC) 07/27/2015  . Chronic back pain   . Frequent falls   . Hypertensive heart disease   . Pulmonary hypertension (HCC)   . Gout   . Microcytic anemia 01/26/2015  . History of seizure 01/26/2015   Past Medical History:  Past Medical History:  Diagnosis Date  . ACS (acute coronary syndrome) (HCC) 06/12/2015  . Acute on chronic blood loss anemia 11/25/2017  . Arthritis   . Asthma   . Cerebrovascular accident (CVA) due to embolism  (HCC) 10/26/2016  . Chronic pain   . Constipation   . Diabetes mellitus without complication (HCC)   . Duodenitis   . Embolic stroke (HCC)   . Falls frequently   . Focal seizure (HCC)   . GERD (gastroesophageal reflux disease)   . Hypertension   . Impacted fracture 02/15/2017   right glenoid  . Pneumonia 10/2015  . Seizures (HCC)    last seizure March 2015  . Small bowel obstruction (HCC) 11/25/2017   Past Surgical History:  Past Surgical History:  Procedure Laterality Date  . APPENDECTOMY    . BACK SURGERY    . CHOLECYSTECTOMY    . TEE WITHOUT CARDIOVERSION N/A 10/30/2016   Procedure: TRANSESOPHAGEAL ECHOCARDIOGRAM (TEE);  Surgeon: Lars Masson, MD;  Location: [facility] ENDOSCOPY;  Service: Cardiovascular;  Laterality: N/A;   HPI:  Pt is a 64 y.o. female with medical history significant for history of embolic stroke, hypertension, type 2 diabetes, chronic constipation and duodenitis status post cholecystectomy on Creon replacement who presented from home on 12/24/2019 with a chief complaint of worsening confusion.  CT of the head revealed chronic atrophic and ischemic changes and new area of decreased attenuation in the posterior parietal lobe consistent with subacute ischemia.      Assessment / Plan / Recommendation Clinical Impression  Pt participated in a speech/language/cognition evaluation with her son, Antione present. Pt denied any baseline deficits or acute changes in speech, language, or cognition. Pt's son indicated that the pt has memory difficulty at baseline and that the noted confusion seems to have resolved. Articulatory precision was mildly reduced but functional. Her language skills were within functional  limits but her auditory comprehension of paragraph-level material and her ability to complete three-step commands were negatively impacted by her memory. She presented with cognitive-linguistic deficits in the areas of orientation, awareness, memory, attention, and complex  problem solving. However, pt's son reported that the pt's performance today is representative of her baseline. Acute skilled SLP services are therefore not clinically indicated at this time.  Pt, son, and nursing were educated regarding results and recommendations; all  parties verbalized understanding as well as agreement with plan of care.    SLP Assessment  SLP Recommendation/Assessment: Patient does not need any further Speech Lanaguage Pathology Services SLP Visit Diagnosis: Cognitive communication deficit (R41.841)    Follow Up Recommendations  None    Frequency and Duration           SLP Evaluation Cognition  Overall Cognitive Status: History of cognitive impairments - at baseline Arousal/Alertness: Awake/alert Orientation Level: Oriented to person;Oriented to place;Disoriented to time Attention: Focused;Sustained Focused Attention: Appears intact Sustained Attention: Impaired Sustained Attention Impairment: Verbal complex Memory: Impaired Memory Impairment: Retrieval deficit;Decreased recall of new information(Immediate: 3/3; delayed: 0/3; with cues: 3/3) Awareness: Impaired Awareness Impairment: Emergent impairment Problem Solving: Impaired Problem Solving Impairment: Verbal complex       Comprehension  Auditory Comprehension Overall Auditory Comprehension: Appears within functional limits for tasks assessed Yes/No Questions: Impaired Basic Immediate Environment Questions: (5/5) Complex Questions: (3/5) Commands: Impaired Two Step Basic Commands: (4/4) Multistep Basic Commands: (1/4) Conversation: Complex Interfering Components: Attention;Processing speed;Working Marine scientist Reading Comprehension Reading Status: Not tested    Expression Expression Primary Mode of Expression: Verbal Verbal Expression Overall Verbal Expression: Appears within functional limits for tasks assessed Initiation: No impairment Automatic Speech: Counting;Day of week;Month of  year(WNL) Level of Generative/Spontaneous Verbalization: Conversation Repetition: No impairment Naming: No impairment Pragmatics: No impairment   Oral / Motor  Oral Motor/Sensory Function Overall Oral Motor/Sensory Function: Within functional limits Motor Speech Overall Motor Speech: Appears within functional limits for tasks assessed Respiration: Within functional limits Phonation: Normal Resonance: Within functional limits Articulation: (Mildly imprecise but functional.) Intelligibility: Intelligible Motor Planning: Witnin functional limits   Emmalee Solivan I. Hardin Negus, [place], [place] Office number [phone] Pager [phone]                    Horton Marshall 12/25/2019, 1:09 PM

## 2019-12-25 NOTE — Progress Notes (Signed)
OT Cancellation Note  Patient Details Name: Lougenia Morrissey MRN: [id number] DOB: February 23, 1956   Cancelled Treatment:    Reason Eval/Treat Not Completed: Patient at procedure or test/ unavailable. Plan to reattempt  Raynald Kemp, OT Acute Rehabilitation Services Pager: [phone] Office: [phone]  12/25/2019, 10:29 AM

## 2019-12-25 NOTE — H&P (Signed)
History and Physical    Amber [name] JJK:[id number] DOB: 1956-07-25 DOA: 12/24/2019  PCP: [name], MD  Patient coming from: Home  I have personally briefly reviewed patient's old medical records in [facility]  Chief Complaint: Confusion  HPI: Amber [name] is a 64 y.o. female with medical history significant for history of embolic stroke, hypertension, type 2 diabetes, chronic constipation and duodenitis status post cholecystectomy on Creon replacement who presented from home on 12/24/2019 with a chief complaint of worsening confusion. She presented to med [facility] on 12/24/2019 with her family bring her in with complaints of confusion, diffuse myalgias and drooling and light sensitivity for 2 days.  Her symptoms primarily manifested as forgetfulness, word finding difficulty and intermittent left hand numbness with associated unsteadiness/generalized weakness.  Denies associated vision changes, chest pain, dyspnea.  ED Course: On arrival to the ED pt was afebrile, heart rate 95, respiratory rate 20, blood pressure 100 4107, saturating on percent room air. Labs and imaging studies acquired. Initial labwork notable for WBC 13.1, hemoglobin 9.5, bicarb 20.  Creatinine 0. 83.  Troponin 11.  Imaging studies notable for head CT with evidence of subacute stroke. EDP consulted neurology will evaluate patient on arrival to [facility].  Patient was administered oxycodone. Given concern for stroke, decision was made to admit.   Review of Systems: As per HPI otherwise 10 point review of systems negative.   Past Medical History:  Diagnosis Date  . ACS (acute coronary syndrome) (HCC) 06/12/2015  . Acute on chronic blood loss anemia 11/25/2017  . Arthritis   . Asthma   . 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 Chronic pain   . Constipation   . Diabetes mellitus without complication (HCC)   . Duodenitis   . Embolic stroke (HCC)   . Falls frequently   . Focal  seizure (HCC)   . GERD (gastroesophageal reflux disease)   . Hypertension   . Impacted fracture 02/15/2017   right glenoid  . Pneumonia 10/2015  . Seizures (HCC)    last seizure March 2015  . Small bowel obstruction (HCC) 11/25/2017    Past Surgical History:  Procedure Laterality Date  . APPENDECTOMY    . BACK SURGERY    . CHOLECYSTECTOMY    . TEE WITHOUT CARDIOVERSION N/A 10/30/2016   Procedure: TRANSESOPHAGEAL ECHOCARDIOGRAM (TEE);  Surgeon: Lars Masson, MD;  Location: [facility] ENDOSCOPY;  Service: Cardiovascular;  Laterality: N/A;     reports that she quit smoking about 11 years ago. She has never used smokeless tobacco. She reports that she does not drink alcohol or use drugs.  Allergies  Allergen Reactions  . Ivp Dye [Iodinated Diagnostic Agents] Itching  . Metrizamide Itching    Family History  Problem Relation Age of Onset  . Kidney failure Mother   . Hypertension Sister   . Hypertension Brother     Prior to Admission medications   Medication Sig Start Date End Date Taking? Authorizing Provider  sodium bicarbonate 650 MG tablet Take by mouth. 05/20/19  Yes [provider]  albuterol (PROVENTIL HFA;VENTOLIN HFA) 108 (90 Base) MCG/ACT inhaler Inhale 2 puffs into the lungs every 4 (four) hours as needed for wheezing or shortness of breath. 01/16/19   Glade Lloyd, MD  allopurinol (ZYLOPRIM) 300 MG tablet Take 300 mg by mouth daily. 11/11/16   [provider]  apixaban (ELIQUIS) 2.5 MG TABS tablet Take 2.5 mg by mouth 2 (two) times daily.    [provider]  atorvastatin (LIPITOR) 10 MG tablet Take 10 mg by mouth daily at 6 PM.  08/13/15   [provider]  colestipol (COLESTID) 1 g tablet Take 1 g by mouth 2 (two) times daily.    [provider]  CREON 24000-76000 units CPEP PLEASE SEE ATTACHED FOR DETAILED DIRECTIONS 09/23/19   [provider]  feeding supplement, ENSURE ENLIVE, (ENSURE ENLIVE) LIQD Take 237 mLs by mouth  2 (two) times daily between meals. 11/10/15   Catarina Hartshorn, MD  furosemide (LASIX) 40 MG tablet Take 1 tablet (40 mg total) by mouth 2 (two) times daily for 30 days. 01/16/19 02/15/19  Glade Lloyd, MD  metoprolol succinate (TOPROL-XL) 25 MG 24 hr tablet Take 1 tablet (25 mg total) by mouth daily. 10/15/18   Edsel Petrin, DO  Multiple Vitamin (MULTIVITAMIN WITH MINERALS) TABS tablet Take 1 tablet by mouth daily.    [provider]  Oxycodone HCl 20 MG TABS Take 1 tablet by mouth 4 (four) times daily. 12/08/19   [provider]  pantoprazole (PROTONIX) 40 MG tablet Take 40 mg by mouth 2 (two) times daily. 08/21/18   [provider]  polyethylene glycol (MIRALAX / GLYCOLAX) packet Take 17 g by mouth daily. 01/03/19   Ghimire, Werner Lean, MD  potassium chloride SA (K-DUR,KLOR-CON) 20 MEQ tablet Take 1 tablet (20 mEq total) by mouth daily. 01/04/19   Ghimire, Werner Lean, MD  predniSONE (DELTASONE) 5 MG tablet Take 1 tablet (5 mg total) by mouth daily with breakfast. 01/16/19   Glade Lloyd, MD  senna (SENOKOT) 8.6 MG TABS tablet Take 2 tablets (17.2 mg total) by mouth at bedtime. 01/03/19   Ghimire, Werner Lean, MD  valproic acid (DEPAKENE) 250 MG capsule Take 500 mg by mouth 3 (three) times daily.    [provider]    Physical Exam: Vitals:   12/24/19 2100 12/24/19 2200 12/24/19 2230 12/24/19 2339  BP: (!) 155/127 (!) 147/110 (!) 136/111 (!) 140/107  Pulse: (!) 123 (!) 134 (!) 115 95  Resp: (!) 26 19 12 20   Temp:      TempSrc:      SpO2: 100% 100% 100% 100%  Weight:      Height:       Constitutional: visibly uncomfortable due to back pain Eyes: PERRL, lids and conjunctivae normal ENMT: Mucous membranes are moist.  Sparse white patches noted on posterior tongue and oropharynx. Neck: normal, supple, no masses, no thyromegaly Respiratory: clear to auscultation bilaterally, no wheezing, no crackles. Normal respiratory effort. No accessory muscle use.    Cardiovascular: Regular rate and rhythm, no murmurs / rubs / gallops. No extremity edema. 2+ pedal pulses. No carotid bruits.  Abdomen: no tenderness, no masses palpated. No hepatosplenomegaly. Bowel sounds positive.  Musculoskeletal: no clubbing / cyanosis. No joint deformity upper and lower extremities. Good ROM, no contractures. Normal muscle tone.  Skin: no rashes, lesions, ulcers. No induration Neurologic: CN 2-12 grossly intact. Sensation intact. Strength 5/5 in all 4.  Psychiatric: Normal judgment and insight. Alert and oriented x 3.  Mood is distressed.  Labs on Admission: I have personally reviewed following labs and imaging studies  CBC: Recent Labs  Lab 12/24/19 1845  WBC 13.1*  NEUTROABS 5.6  HGB 9.5*  HCT 28.5*  MCV 79.6*  PLT 185   Basic Metabolic Panel: Recent Labs  Lab 12/24/19 1845  NA 136  K 3.7  CL 110  CO2 20*  GLUCOSE 99  BUN 11  CREATININE 0.83  CALCIUM 8.1*   GFR: Estimated Creatinine Clearance: 51.1 mL/min (by C-G formula based on SCr of 0.83 mg/dL). Liver Function Tests: Recent Labs  Lab 12/24/19 1845  AST 27  ALT 20  ALKPHOS 102  BILITOT 0.5  PROT 6.0*  ALBUMIN 2.3*   No results for input(s): LIPASE, AMYLASE in the last 168 hours. No results for input(s): AMMONIA in the last 168 hours. Coagulation Profile: No results for input(s): INR, PROTIME in the last 168 hours. Cardiac Enzymes: No results for input(s): CKTOTAL, CKMB, CKMBINDEX, TROPONINI in the last 168 hours. BNP (last 3 results) No results for input(s): PROBNP in the last 8760 hours. HbA1C: No results for input(s): HGBA1C in the last 72 hours. CBG: No results for input(s): GLUCAP in the last 168 hours. Lipid Profile: No results for input(s): CHOL, HDL, LDLCALC, TRIG, CHOLHDL, LDLDIRECT in the last 72 hours. Thyroid Function Tests: No results for input(s): TSH, T4TOTAL, FREET4, T3FREE, THYROIDAB in the last 72 hours. Anemia Panel: No results for input(s): VITAMINB12,  FOLATE, FERRITIN, TIBC, IRON, RETICCTPCT in the last 72 hours. Urine analysis:    Component Value Date/Time   COLORURINE YELLOW 01/04/2019 2048   APPEARANCEUR HAZY (A) 01/04/2019 2048   LABSPEC 1.015 01/04/2019 2048   PHURINE 7.0 01/04/2019 2048   GLUCOSEU NEGATIVE 01/04/2019 2048   HGBUR SMALL (A) 01/04/2019 2048   BILIRUBINUR NEGATIVE 01/04/2019 2048   KETONESUR NEGATIVE 01/04/2019 2048   PROTEINUR 30 (A) 01/04/2019 2048   UROBILINOGEN 1.0 07/27/2015 1052   NITRITE NEGATIVE 01/04/2019 2048   LEUKOCYTESUR NEGATIVE 01/04/2019 2048    Radiological Exams on Admission: CT Head Wo Contrast  Result Date: 12/24/2019 CLINICAL DATA:  Confusion EXAM: CT HEAD WITHOUT CONTRAST TECHNIQUE: Contiguous axial images were obtained from the base of the skull through the vertex without intravenous contrast. COMPARISON:  10/10/2018 FINDINGS: Brain: Chronic atrophic changes are noted. 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 There is a new area of wedge like decreased attenuation identified in the posterior aspect of the right parietal lobe consistent with subacute ischemia. No other findings to suggest acute infarct are noted. Scattered chronic white matter ischemic changes are seen. Vascular: No hyperdense vessel or unexpected calcification. Skull: Normal. Negative for fracture or focal lesion. Sinuses/Orbits: No acute finding. Other: None. IMPRESSION: Chronic atrophic and ischemic changes. New area of decreased attenuation in the posterior parietal lobe consistent with subacute ischemia. This would correspond with the patient's recent history. Electronically Signed   By: Inez Catalina M.D.   On: 12/24/2019 19:06   DG Chest Portable 1 View  Result Date: 12/24/2019 CLINICAL DATA:  Increased confusion EXAM: PORTABLE CHEST 1 VIEW COMPARISON:  01/15/2019 FINDINGS: Cardiac shadow is enlarged but stable. Postsurgical changes are noted in the cervicothoracic spine. The lungs are clear bilaterally. No focal infiltrate or  sizable effusion is seen. Old rib fractures are noted on the right with healing. IMPRESSION: No acute abnormality noted. Electronically Signed   By: Inez Catalina M.D.   On: 12/24/2019 19:07    EKG: Independently reviewed.  Sinus rhythm, rate 97, evidence of LVH with T wave inversions in anterolateral leads.  Assessment/Plan Active Problems:   Acute ischemic stroke (HCC)  Subacute ischemic stroke Hypertension History of embolic stroke -Patient presents from home with family reporting 2 to 3-day history of worsening confusion, intermittent hand numbness and generalized weakness that has persisted for several months.  Found to have evidence of subacute stroke in the posterior right parietal lobe in the background of chronic atrophic and ischemic  changes and prior right MCA infarct.  Presented outside of TPA window. Plan: -Patient's EKG changes are likely attributable to her stroke, she has no elevated troponin, chest pain or dyspnea.  Will trend EKGs. -Telemetry -Neurology consulted, appreciate recommendations -Neurochecks ordered -MRI brain ordered -TTE ordered -Vascular U/S carotid ordered -Continue home Eliquis, Lipitor, Lasix, Toprol-XL, Depakote -Obtain lipid profile  Oral thrush -Evidence of oral thrush noted on initial physical exam, will treat with nystatin swish and swallow -Rule out HIV, rule out uncontrolled diabetes  Type 2 diabetes -Obtain A1c, not on any home medications for diabetes, initiated indicated  Chronic steroid use -Patient on 5 mg chronic prednisone for unclear reason at this time, will continue due to likely physiologic dependence, the patient should be evaluated for potential to have prolonged taper if possible  Chronic back pain -Continue home pain regimen, wean as tolerated given elevated opiate dose   DVT prophylaxis: Patient on Eliquis Code Status: Full (Full/Partial (specify details) Family Communication: Spoke with son Antoin at [phone]   Disposition Plan: Likely discharge in 2 to 3 days pending evaluation, clinical improvement and potential skilled nursing facility placement Consults called: Neurology Dr. Amada Jupiter Admission status: Inpatient telemetry   S. Dellie Catholic, DO [facility] Pager [phone]  If 7PM-7AM, please contact night-coverage [email or web address] Password [email or web address]  12/25/2019, 12:30 AM

## 2019-12-25 NOTE — Progress Notes (Signed)
Patient seen and examined personally, I reviewed the chart, history and physical and admission note, done by admitting physician this morning and agree with the same with following addendum.  Please refer to the morning admission note for more detailed plan of care.  Briefly,  64 year old female prior history of embolic strokes, upper extremities DVTs, PFO, HTN T2DM, chronic constipation/duodenitis status post cholecystectomy, chronic Creon supplementation, brought to the ER with increased confusion, soreness all over, drooling and light sensitivity for the past 2 days prior to admission.  Patient presented to med [facility] 2/3.  Patient has been very forgetful, difficulty with word finding, intermittent left hand numbness with unsteadiness and generalized weakness.  In the ER vitals stable, leukocytosis 13 point 1K, troponin XI, CT head with evidence of subacute stroke, neurology was consulted and patient was admitted.  On my exam this morning she is alert awake oriented to place, current president and current month year.  She is on room air.  Able to lift of her B/L upper and lower extremities but grossly weak Morning in some pain-generalized. feeling hungry.  Issues: Subacute ischemic stroke/history of embolic stroke, with history of prior upper extremity DVT and PFO on eliquis: Appreciate neurology input, pursuing further work-up of ischemic stroke with MRI brain/MR angio head, TTE, carotid duplex, telemetry monitoring PT OT and speech eval, neuro check monitoring per protocol.  Continue current Eliquis, Lipitor.  LDL stable at 46, hemoglobin A1c pending, antiphospholipid antibody, urine drug screen pending.  GNF:[id number] urinalysis WBC more than 50, leukocyte esterase moderate - has mild leukocytosis- no fever, has dysuria and suprapubic tenderness- likely UTI- has history of VRE UTI, d/w pharmacy- started rocephin, ordered urine for culture.   Other chronic conditions as below- refer to  HPI. Oral thrush T2DM Chronic steroid use Chronic back pain

## 2019-12-25 NOTE — Progress Notes (Signed)
Attempted exam. Patient stated and wanted to eat and have the test done later.    Blanch Media 12/25/2019 10:05 AM

## 2019-12-25 NOTE — Plan of Care (Signed)

## 2019-12-25 NOTE — Progress Notes (Signed)
  Echocardiogram 2D Echocardiogram has been performed.  Amber [name] 12/25/2019, 9:40 AM

## 2019-12-25 NOTE — Consult Note (Signed)
Neurology Consultation Reason for Consult: Stroke Referring Physician: Loney Hering, S  CC: Stroke  History is obtained from: Patient, chart  HPI: Amber [name] is a 64 y.o. female with a history of embolic strokes and upper extremity DVTs as well as PFO.  It looks like she was being evaluated for possible PFO closure several years ago, however I do not see where this ever occurred.  Due to recurrent DVTs that she is on the DVT prevention dose of Eliquis of 2.5 twice daily.  Over the past few days, forgetfulness, confusion and drooling have been noted.  It was for this reason that she was taken to [facility] where head CT was performed which demonstrates new stroke.  Patient also states that she has been having belly pain for the past day.    LKW: Several days ago, unclear exactly when tpa given?: no, outside of window   ROS: A 14 point ROS was performed and is negative except as noted in the HPI.   Past Medical History:  Diagnosis Date  . ACS (acute coronary syndrome) (HCC) 06/12/2015  . Acute on chronic blood loss anemia 11/25/2017  . Arthritis   . Asthma   . 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 Chronic pain   . Constipation   . Diabetes mellitus without complication (HCC)   . Duodenitis   . Embolic stroke (HCC)   . Falls frequently   . Focal seizure (HCC)   . GERD (gastroesophageal reflux disease)   . Hypertension   . Impacted fracture 02/15/2017   right glenoid  . Pneumonia 10/2015  . Seizures (HCC)    last seizure March 2015  . Small bowel obstruction (HCC) 11/25/2017     Family History  Problem Relation Age of Onset  . Kidney failure Mother   . Hypertension Sister   . Hypertension Brother      Social History:  reports that she quit smoking about 11 years ago. She has never used smokeless tobacco. She reports that she does not drink alcohol or use drugs.   Exam: Current vital signs: BP (!) 147/106 (BP Location: Left Arm)    Pulse 90   Temp 98.7 F (37.1 C)   Resp 20   Ht 5' (1.524 m)   Wt 47.6 kg   SpO2 100%   BMI 20.51 kg/m  Vital signs in last 24 hours: Temp:  [98.5 F (36.9 C)-98.7 F (37.1 C)] 98.7 F (37.1 C) (02/04 0130) Pulse Rate:  [90-134] 90 (02/04 0130) Resp:  [12-26] 20 (02/04 0130) BP: (136-158)/(103-127) 147/106 (02/04 0130) SpO2:  [97 %-100 %] 100 % (02/04 0130) Weight:  [47.6 kg] 47.6 kg (02/03 1823)   Physical Exam  Constitutional: Appears well-developed and well-nourished.  Psych: Affect appropriate to situation Eyes: No scleral injection HENT: No OP obstrucion MSK: Arthritic appearing hands Cardiovascular: Normal rate and regular rhythm.  Respiratory: Effort normal, non-labored breathing GI: Slight tenderness to palpation Skin: WDI  Neuro: Mental Status: Patient is awake, alert, she gives the month is March, does not give the correct year, Patient is able to give a clear and coherent history.* She does appear to extinguish to multiple and modalities on the left Cranial Nerves: II: Visual Fields are full, but she does extinguish to simultaneous visual stimuli. Pupils are equal, round, and reactive to light.   III,IV, VI: EOMI without ptosis or diploplia.  V: Facial sensation is symmetric to touch VII: Facial movement is symmetric.  VIII: hearing is  intact to voice X: Uvula elevates symmetrically XI: Shoulder shrug is symmetric. XII: tongue is midline without atrophy or fasciculations.  Motor: Motor exam is markedly limited by pain, she does not give good effort in any extremity, but has relatively symmetric grip strength.  She is able to lift both legs against gravity when repeatedly prompted. Sensory: She endorses symmetric sensation but often appreciates sensation on the left is actually occurring on the right she does extinguish but not consistently. Cerebellar: She does not perform   I have reviewed labs in epic and the results pertinent to this consultation  are: CMP-unremarkable  I have reviewed the images obtained: CT head-infarct in the right parietal region  Impression: 64 year old female who has had recurrent embolic strokes which have previously been attributed to PFO with upper extremity DVT.  She does report a history of rheumatoid arthritis and therefore I wonder if antiphospholipid antibodies might be helpful.  She is being admitted for further evaluation and physical therapy.  Recommendations: - HgbA1c, fasting lipid panel - MRI, MRA  of the brain without contrast - Frequent neuro checks - Echocardiogram - Carotid dopplers - Prophylactic therapy-Antiplatelet med: Aspirin - dose 325mg  PO or 300mg  PR - Risk factor modification - Telemetry monitoring - PT consult, OT consult, Speech consult -Antiphospholipid antibodies - Stroke team to follow    Roland Rack, MD [facility] [phone]  If 7pm- 7am, please page neurology on call as listed in [place].

## 2019-12-25 NOTE — Progress Notes (Signed)
TCD and carotid duplex have been completed.   Preliminary results in CV Proc.   Blanch Media 12/25/2019 2:18 PM

## 2019-12-26 ENCOUNTER — Inpatient Hospital Stay (HOSPITAL_COMMUNITY): Payer: Medicaid Other

## 2019-12-26 DIAGNOSIS — I82A22 Chronic embolism and thrombosis of left axillary vein: Secondary | ICD-10-CM

## 2019-12-26 DIAGNOSIS — I5032 Chronic diastolic (congestive) heart failure: Secondary | ICD-10-CM

## 2019-12-26 DIAGNOSIS — M069 Rheumatoid arthritis, unspecified: Secondary | ICD-10-CM

## 2019-12-26 DIAGNOSIS — I639 Cerebral infarction, unspecified: Secondary | ICD-10-CM

## 2019-12-26 DIAGNOSIS — K8689 Other specified diseases of pancreas: Secondary | ICD-10-CM

## 2019-12-26 DIAGNOSIS — N390 Urinary tract infection, site not specified: Secondary | ICD-10-CM

## 2019-12-26 DIAGNOSIS — R109 Unspecified abdominal pain: Secondary | ICD-10-CM

## 2019-12-26 DIAGNOSIS — R5381 Other malaise: Secondary | ICD-10-CM

## 2019-12-26 LAB — HEMOGLOBIN A1C
Hgb A1c MFr Bld: 4.4 % — ABNORMAL LOW (ref 4.8–5.6)
Mean Plasma Glucose: 80 mg/dL

## 2019-12-26 LAB — URINE CULTURE

## 2019-12-26 MED ORDER — ACETAMINOPHEN 500 MG PO TABS
1000.0000 mg | ORAL_TABLET | Freq: Three times a day (TID) | ORAL | Status: DC
  Administered 2019-12-26 – 2019-12-30 (×13): 1000 mg via ORAL
  Filled 2019-12-26 (×14): qty 2

## 2019-12-26 MED ORDER — OXYCODONE HCL 5 MG PO TABS
10.0000 mg | ORAL_TABLET | Freq: Three times a day (TID) | ORAL | Status: DC | PRN
  Administered 2019-12-27: 10 mg via ORAL
  Filled 2019-12-26: qty 2

## 2019-12-26 MED ORDER — OXYCODONE HCL 5 MG PO TABS: 20.0000 mg | ORAL_TABLET | Freq: Three times a day (TID) | ORAL | Status: DC | PRN

## 2019-12-26 MED ORDER — SENNOSIDES-DOCUSATE SODIUM 8.6-50 MG PO TABS: 1.0000 | ORAL_TABLET | Freq: Two times a day (BID) | ORAL | Status: DC | PRN

## 2019-12-26 MED ORDER — ONDANSETRON HCL 4 MG/2ML IJ SOLN
4.0000 mg | Freq: Four times a day (QID) | INTRAMUSCULAR | Status: DC | PRN
  Administered 2019-12-26 – 2019-12-27 (×2): 4 mg via INTRAVENOUS
  Filled 2019-12-26 (×2): qty 2

## 2019-12-26 NOTE — Progress Notes (Signed)
PROGRESS NOTE  Amber [name] ZOX:[id number] DOB: 10/22/1956   PCP: [name], MD  Patient is from: Home.  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  In ED, CT head with evidence of subacute CVA.  Neurology consulted and admitted.  MRI revealed new posterior right MCA infarct.  MRA without LVO or significant stenosis.  Carotid Doppler with 40 to 59% left ICA stenosis and 1 to 39% right ICA stenosis.  Transcranial Doppler poor quality.  TTE with normal EF without evidence of thrombus.  Upper and lower extremity Dopplers negative for DVT.  Subjective: No major events overnight or this morning.  She complains of pain all over including abdominal pain.  Oriented x4 except date.  Denies chest pain or dyspnea.  Objective: Vitals:   12/26/19 0215 12/26/19 0326 12/26/19 0826 12/26/19 0853  BP:  (!) 133/94 (!) 135/112 132/88  Pulse: 77 85 82   Resp:  16 16   Temp:  98.1 F (36.7 C) 98.2 F (36.8 C)   TempSrc:  Axillary Oral   SpO2: 100% 100% 100%   Weight:      Height:        Intake/Output Summary (Last 24 hours) at 12/26/2019 1249 Last data filed at 12/25/2019 2100 Gross per 24 hour  Intake 460 ml  Output 500 ml  Net -40 ml   Filed Weights   12/24/19 1823  Weight: 47.6 kg    Examination:  GENERAL: No apparent distress.  Nontoxic. HEENT: MMM.  Vision and hearing grossly intact.  NECK: Supple.  No apparent JVD.  RESP:  No IWOB. Good air movement bilaterally. CVS:  RRR. Heart sounds normal.  ABD/GI/GU: Bowel sounds present. Soft.  Diffuse tenderness. MSK/EXT:  Moves extremities. No apparent deformity. No edema.  SKIN: no apparent skin lesion or  wound NEURO: Awake, alert and oriented x4 except date.  No apparent focal neuro deficit other than mild bilateral lower extremity PSYCH: Calm. Normal affect.  Procedures:  None  Assessment & Plan: Acute right MCA embolic CVA: presented with constellation of symptoms including word finding difficulty, left hand numbness and unsteadiness.  MRI brain with acute posterior right MCA CVA.  MRA, carotid Doppler, upper and lower extremity Doppler and echocardiogram without significant finding.  History of upper extremity DVT and known PFO.  LDL 46.  A1c 4.4. -Appreciate guidance by neurology -Continue home Eliquis-has history of PFO and DVT.  PFO closure? -Continue statin -PT/OT/SLP-recommended SNF.  Acute urinary tract infection: Has UTI symptoms.  Urine culture multiple species -Continue ceftriaxone.  History of upper extremity DVT: Upper and lower extremity Dopplers negative for DVT this admission. -Continue Eliquis  Chronic diastolic CHF: Echo with EF of 60 to 65% and G1 DD but no other significant structural functional abnormalities.  Appears euvolemic. -Continue home Lasix -Monitor fluid status  Essential hypertension: Normotensive -Continue home metoprolol  Pancreatic insufficiency -Continue home Creon  Controlled DM-2: A1c 4.4%.  Not on medication at home.  History of rheumatoid arthritis on chronic steroid -Continue home prednisone and pain medications.  Chronic pain: Reports pain in her legs -Continue home medications  Abdominal pain/tenderness: Constipation.  She is on a very high-dose opiates -KUB -Bowel regimen  Debility/physical deconditioning -PT/OT-recommended SNF-family to discuss and decide.  DVT prophylaxis: On Eliquis Code Status: Full code Family Communication: Updated patient's daughter at bedside.  Discharge barrier: Medical work-up for CVA and clearance by neurology Patient is from: Home Final disposition: Likely SNF when medically  stable-family undecided yet.  Consultants: Neurology   Microbiology summarized: COVID-19 and influenza PCR negative. Urine culture with multiple species. Repeat urine culture pending.  Sch Meds:  Scheduled Meds: . allopurinol  300 mg Oral Daily  . apixaban  2.5 mg Oral BID  . atorvastatin  10 mg Oral q1800  . colestipol  1 g Oral BID  . feeding supplement (ENSURE ENLIVE)  237 mL Oral BID BM  . furosemide  40 mg Oral BID  . lipase/protease/amylase  36,000 Units Oral TID AC  . metoprolol succinate  25 mg Oral Daily  . nystatin  5 mL Oral QID  . pantoprazole  40 mg Oral BID  . polyethylene glycol  17 g Oral Daily  . predniSONE  5 mg Oral Q breakfast  . valproic acid  500 mg Oral TID   Continuous Infusions: . cefTRIAXone (ROCEPHIN)  IV 1 g (12/26/19 1141)   PRN Meds:.acetaminophen **OR** acetaminophen (TYLENOL) oral liquid 160 mg/5 mL **OR** acetaminophen, albuterol, oxyCODONE, senna-docusate  Antimicrobials: Anti-infectives (From admission, onward)   Start     Dose/Rate Route Frequency Ordered Stop   12/25/19 1045  cefTRIAXone (ROCEPHIN) 1 g in sodium chloride 0.9 % 100 mL IVPB     1 g 200 mL/hr over 30 Minutes Intravenous Every 24 hours 12/25/19 1034         I have personally reviewed the following labs and images: CBC: Recent Labs  Lab 12/24/19 1845 12/25/19 0359  WBC 13.1* 11.1*  NEUTROABS 5.6  --   HGB 9.5* 8.3*  HCT 28.5* 24.3*  MCV 79.6* 78.1*  PLT 185 163   BMP &GFR Recent Labs  Lab 12/24/19 1845 12/25/19 0359  NA 136 141  K 3.7 3.8  CL 110 114*  CO2 20* 20*  GLUCOSE 99 87  BUN 11 9  CREATININE 0.83 0.78  CALCIUM 8.1* 7.9*   Estimated Creatinine Clearance: 53 mL/min (by C-G formula based on SCr of 0.78 mg/dL). Liver & Pancreas: Recent Labs  Lab 12/24/19 1845  AST 27  ALT 20  ALKPHOS 102  BILITOT 0.5  PROT 6.0*  ALBUMIN 2.3*   No results for input(s): LIPASE, AMYLASE in the last 168 hours. No results for input(s): AMMONIA in the last  168 hours. Diabetic: Recent Labs    12/25/19 0359  HGBA1C 4.4*   No results for input(s): GLUCAP in the last 168 hours. Cardiac Enzymes: No results for input(s): CKTOTAL, CKMB, CKMBINDEX, TROPONINI in the last 168 hours. No results for input(s): PROBNP in the last 8760 hours. Coagulation Profile: No results for input(s): INR, PROTIME in the last 168 hours. Thyroid Function Tests: No results for input(s): TSH, T4TOTAL, FREET4, T3FREE, THYROIDAB in the last 72 hours. Lipid Profile: Recent Labs    12/25/19 0359  CHOL 120  HDL 52  LDLCALC 46  TRIG 111  CHOLHDL 2.3   Anemia Panel: No results for input(s): VITAMINB12, FOLATE, FERRITIN, TIBC, IRON, RETICCTPCT in the last 72 hours. Urine analysis:    Component Value Date/Time   COLORURINE YELLOW 12/25/2019 0510   APPEARANCEUR HAZY (A) 12/25/2019 0510   LABSPEC 1.015 12/25/2019 0510   PHURINE 6.0 12/25/2019 0510   GLUCOSEU NEGATIVE 12/25/2019 0510   HGBUR NEGATIVE 12/25/2019 0510   BILIRUBINUR NEGATIVE 12/25/2019 0510   KETONESUR NEGATIVE  12/25/2019 0510   PROTEINUR NEGATIVE 12/25/2019 0510   UROBILINOGEN 1.0 07/27/2015 1052   NITRITE NEGATIVE 12/25/2019 0510   LEUKOCYTESUR MODERATE (A) 12/25/2019 0510   Sepsis Labs: Invalid input(s): PROCALCITONIN, LACTICIDVEN  Microbiology: Recent Results (from the past 240 hour(s))  Respiratory Panel by RT PCR (Flu A&B, Covid) - Nasopharyngeal Swab     Status: None   Collection Time: 12/24/19  7:00 PM   Specimen: Nasopharyngeal Swab  Result Value Ref Range Status   SARS Coronavirus 2 by RT PCR NEGATIVE NEGATIVE Final    Comment: (NOTE) SARS-CoV-2 target nucleic acids are NOT DETECTED. The SARS-CoV-2 RNA is generally detectable in upper respiratoy specimens during the acute phase of infection. The lowest concentration of SARS-CoV-2 viral copies this assay can detect is 131 copies/mL. A negative result does not preclude SARS-Cov-2 infection and should not be used as the sole basis  for treatment or other patient management decisions. A negative result may occur with  improper specimen collection/handling, submission of specimen other than nasopharyngeal swab, presence of viral mutation(s) within the areas targeted by this assay, and inadequate number of viral copies (<131 copies/mL). A negative result must be combined with clinical observations, patient history, and epidemiological information. The expected result is Negative. Fact Sheet for Patients:  https://www.moore.com/ Fact Sheet for Healthcare Providers:  https://www.young.biz/ This test is not yet ap proved or cleared by the [place] FDA and  has been authorized for detection and/or diagnosis of SARS-CoV-2 by FDA under an Emergency Use Authorization (EUA). This EUA will remain  in effect (meaning this test can be used) for the duration of the COVID-19 declaration under Section 564(b)(1) of the Act, 21 U.S.C. section 360bbb-3(b)(1), unless the authorization is terminated or revoked sooner.    Influenza A by PCR NEGATIVE NEGATIVE Final   Influenza B by PCR NEGATIVE NEGATIVE Final    Comment: (NOTE) The Xpert Xpress SARS-CoV-2/FLU/RSV assay is intended as an aid in  the diagnosis of influenza from Nasopharyngeal swab specimens and  should not be used as a sole basis for treatment. Nasal washings and  aspirates are unacceptable for Xpert Xpress SARS-CoV-2/FLU/RSV  testing. Fact Sheet for Patients: https://www.moore.com/ Fact Sheet for Healthcare Providers: https://www.young.biz/ This test is not yet approved or cleared by the [place] FDA and  has been authorized for detection and/or diagnosis of SARS-CoV-2 by  FDA under an Emergency Use Authorization (EUA). This EUA will remain  in effect (meaning this test can be used) for the duration of the  Covid-19 declaration under Section 564(b)(1) of the Act, 21  U.S.C.  section 360bbb-3(b)(1), unless the authorization is  terminated or revoked. Performed at [facility], [address], [place], [place] [address]   Culture, Urine     Status: Abnormal   Collection Time: 12/25/19  5:10 AM   Specimen: Urine, Random  Result Value Ref Range Status   Specimen Description URINE, RANDOM  Final   Special Requests   Final    culture prior urine sample Performed at [facility], [address], [place], [place] [address]    Culture MULTIPLE SPECIES PRESENT, SUGGEST RECOLLECTION (A)  Final   Report Status 12/26/2019 FINAL  Final    Radiology Studies: VAS US CAROTID (at [facility] and [facility] only)  Result Date: 12/26/2019 Carotid Arterial Duplex Study Indications:       CVA. Risk Factors:      Hypertension, Diabetes, prior CVA. Limitations        Today's exam was  limited due to the patient's inability or                    unwillingness to cooperate. Comparison Study:  no prior Performing Technologist: Blanch Media RVS  Examination Guidelines: A complete evaluation includes B-mode imaging, spectral Doppler, color Doppler, and power Doppler as needed of all accessible portions of each vessel. Bilateral testing is considered an integral part of a complete examination. Limited examinations for reoccurring indications may be performed as noted.  Right Carotid Findings: +----------+--------+--------+--------+------------------+--------------+           PSV cm/sEDV cm/sStenosisPlaque DescriptionComments       +----------+--------+--------+--------+------------------+--------------+ CCA Prox  60      26              heterogenous                     +----------+--------+--------+--------+------------------+--------------+ CCA Distal60      23              heterogenous                     +----------+--------+--------+--------+------------------+--------------+ ICA Prox  56      22      1-39%   heterogenous                      +----------+--------+--------+--------+------------------+--------------+ ICA Distal57      26                                               +----------+--------+--------+--------+------------------+--------------+ ECA                                                 Not visualized +----------+--------+--------+--------+------------------+--------------+ +----------+--------+-------+--------+-------------------+           PSV cm/sEDV cmsDescribeArm Pressure (mmHG) +----------+--------+-------+--------+-------------------+ [id number]                                         +----------+--------+-------+--------+-------------------+ +---------+--------+--+--------+--+---------+ VertebralPSV cm/s59EDV cm/s24Antegrade +---------+--------+--+--------+--+---------+  Left Carotid Findings: +----------+--------+--------+--------+------------------+--------+           PSV cm/sEDV cm/sStenosisPlaque DescriptionComments +----------+--------+--------+--------+------------------+--------+ CCA Prox  58      20              heterogenous               +----------+--------+--------+--------+------------------+--------+ CCA Distal48      18              heterogenous               +----------+--------+--------+--------+------------------+--------+ ICA Prox  95      42      40-59%  heterogenous      tortuous +----------+--------+--------+--------+------------------+--------+ ICA Distal77      38                                         +----------+--------+--------+--------+------------------+--------+ ECA       33      14                                         +----------+--------+--------+--------+------------------+--------+ +----------+--------+--------+--------+-------------------+  PSV cm/sEDV cm/sDescribeArm Pressure (mmHG) +----------+--------+--------+--------+-------------------+ [id number]                                           +----------+--------+--------+--------+-------------------+ +---------+--------+--------+--------------+ VertebralPSV cm/sEDV cm/sNot identified +---------+--------+--------+--------------+   Summary: Right Carotid: Velocities in the right ICA are consistent with a 1-39% stenosis. Left Carotid: Velocities in the left ICA are consistent with a 40-59% stenosis. Vertebrals: Right vertebral artery demonstrates antegrade flow. Left vertebral             artery was not visualized. *See table(s) above for measurements and observations.  Electronically signed by Antony Contras MD on 12/26/2019 at 12:32:27 PM.    Final    VAS [place] LOWER EXTREMITY VENOUS (DVT)  Result Date: 12/26/2019  Lower Venous DVTStudy Indications: Stroke.  Anticoagulation: Eliquis. Limitations: Poor ultrasound/tissue interface and Patient uncooperative. Comparison Study: Bilateral LEV 11-25-16. Performing Technologist: Baldwin Crown ARDMS, RVT  Examination Guidelines: A complete evaluation includes B-mode imaging, spectral Doppler, color Doppler, and power Doppler as needed of all accessible portions of each vessel. Bilateral testing is considered an integral part of a complete examination. Limited examinations for reoccurring indications may be performed as noted. The reflux portion of the exam is performed with the patient in reverse Trendelenburg.  +---------+---------------+---------+-----------+----------+------------------+ RIGHT    CompressibilityPhasicitySpontaneityPropertiesThrombus Aging     +---------+---------------+---------+-----------+----------+------------------+ CFV      Full           Yes      Yes                                     +---------+---------------+---------+-----------+----------+------------------+ SFJ      Full                                                            +---------+---------------+---------+-----------+----------+------------------+ FV Prox  Full                                                             +---------+---------------+---------+-----------+----------+------------------+ FV Mid   Full                                                            +---------+---------------+---------+-----------+----------+------------------+ FV DistalFull                                                            +---------+---------------+---------+-----------+----------+------------------+ PFV      Full                                                            +---------+---------------+---------+-----------+----------+------------------+  POP      Full           Yes      Yes                                     +---------+---------------+---------+-----------+----------+------------------+ PTV      Full                                         Limited                                                                  visualization      +---------+---------------+---------+-----------+----------+------------------+ PERO     Full                                         Limited                                                                  visualization      +---------+---------------+---------+-----------+----------+------------------+   +---------+---------------+---------+-----------+----------+------------------+ LEFT     CompressibilityPhasicitySpontaneityPropertiesThrombus Aging     +---------+---------------+---------+-----------+----------+------------------+ CFV      Full           Yes      Yes                                     +---------+---------------+---------+-----------+----------+------------------+ SFJ      Full                                                            +---------+---------------+---------+-----------+----------+------------------+ FV Prox  Full                                                             +---------+---------------+---------+-----------+----------+------------------+ FV Mid   Full                                                            +---------+---------------+---------+-----------+----------+------------------+ FV DistalFull                                                            +---------+---------------+---------+-----------+----------+------------------+  PFV      Full                                                            +---------+---------------+---------+-----------+----------+------------------+ POP      Full           Yes      Yes                                     +---------+---------------+---------+-----------+----------+------------------+ PTV      Full                                         Limited                                                                  visualization      +---------+---------------+---------+-----------+----------+------------------+ PERO     Full                                         Limited                                                                  visualization      +---------+---------------+---------+-----------+----------+------------------+   Left Technical Findings: Limited visualization of veins due to overlying shadow from calcified arteries. Poorly visualized bilateral calf veins.   Summary: BILATERAL: - No evidence of deep vein thrombosis seen in the lower extremities, bilaterally.  RIGHT: - No cystic structure found in the popliteal fossa.  LEFT: - No cystic structure found in the popliteal fossa.  *See table(s) above for measurements and observations.    Preliminary    VAS [place] UPPER EXTREMITY VENOUS DUPLEX  Result Date: 12/26/2019 UPPER VENOUS STUDY  Indications: stroke Anticoagulation: Eliquis. Limitations: Patient uncooperative. Comparison Study: Upper extremity venous duplex 10-27-16. Performing Technologist: Kennedy Bucker RDMS, RVT  Examination Guidelines: A  complete evaluation includes B-mode imaging, spectral Doppler, color Doppler, and power Doppler as needed of all accessible portions of each vessel. Bilateral testing is considered an integral part of a complete examination. Limited examinations for reoccurring indications may be performed as noted.  Right Findings: +----------+------------+---------+-----------+----------+--------------+ RIGHT     CompressiblePhasicitySpontaneousProperties   Summary     +----------+------------+---------+-----------+----------+--------------+ IJV           Full       Yes       Yes                             +----------+------------+---------+-----------+----------+--------------+ Subclavian  Full       Yes       Yes                             +----------+------------+---------+-----------+----------+--------------+ Axillary      Full       Yes       Yes                             +----------+------------+---------+-----------+----------+--------------+ Brachial      Full       Yes       Yes                             +----------+------------+---------+-----------+----------+--------------+ Radial                                              Not visualized +----------+------------+---------+-----------+----------+--------------+ Ulnar                                               Not visualized +----------+------------+---------+-----------+----------+--------------+ Cephalic      Full                                                 +----------+------------+---------+-----------+----------+--------------+ Basilic       Full                                                 +----------+------------+---------+-----------+----------+--------------+  Left Findings: +----------+------------+---------+-----------+----------+--------------+ LEFT      CompressiblePhasicitySpontaneousProperties   Summary      +----------+------------+---------+-----------+----------+--------------+ IJV           Full       Yes       Yes                             +----------+------------+---------+-----------+----------+--------------+ Subclavian    Full       Yes       Yes                             +----------+------------+---------+-----------+----------+--------------+ Axillary      Full       Yes       Yes                             +----------+------------+---------+-----------+----------+--------------+ Brachial      Full       Yes       Yes                             +----------+------------+---------+-----------+----------+--------------+ Radial  Not visualized +----------+------------+---------+-----------+----------+--------------+ Ulnar                                               Not visualized +----------+------------+---------+-----------+----------+--------------+ Cephalic      Full                                                 +----------+------------+---------+-----------+----------+--------------+ Basilic       Full                                                 +----------+------------+---------+-----------+----------+--------------+ Limited exam, bilateral forearm veins not visualized due to uncooperative patient.  Summary: No evidence of deep vein or superficial vein thrombosis involving the right and left upper extremities.  *See table(s) above for measurements and observations.    Preliminary    VAS [place] TRANSCRANIAL DOPPLER  Result Date: 12/26/2019  Transcranial Doppler Indications: Stroke. Limitations: Uncooperative patient Limitations for diagnostic windows: Unable to insonate right transtemporal window. Unable to insonate left transtemporal window. Comparison Study: no prior Performing Technologist: Blanch Media RVS  Examination Guidelines: A complete evaluation includes B-mode imaging, spectral  Doppler, color Doppler, and power Doppler as needed of all accessible portions of each vessel. Bilateral testing is considered an integral part of a complete examination. Limited examinations for reoccurring indications may be performed as noted.  +---------+-------------+----------+-----------+-------+ RIGHT TCDRight VM (cm)Depth (cm)PulsatilityComment +---------+-------------+----------+-----------+-------+ Opthalmic    10.00                 1.26            +---------+-------------+----------+-----------+-------+  +---------+------------+----------+-----------+-------+ LEFT TCD Left VM (cm)Depth (cm)PulsatilityComment +---------+------------+----------+-----------+-------+ Opthalmic   11.00                 1.39            +---------+------------+----------+-----------+-------+  Summary:  Suboptimal highly limites study due to patient`s lack of cooperation and poor windows throughout. antegrade flow in both opthalic arteries . *See table(s) above for TCD measurements and observations.  Diagnosing physician: Delia Heady MD Electronically signed by Delia Heady MD on 12/26/2019 at 12:33:49 PM.    Final       Chesley Veasey [facility]  If 7PM-7AM, please contact night-coverage [email or web address] Password [facility] 12/26/2019, 12:49 PM

## 2019-12-26 NOTE — TOC Initial Note (Signed)
Transition of Care [facility]) - Initial/Assessment Note    Patient Details  Name: Amber [name] MRN: [id number] Date of Birth: 1955-11-30  Transition of Care [facility]) CM/SW Contact:    Geralynn Ochs, LCSW Phone Number: 12/26/2019, 11:10 AM  Clinical Narrative:    CSW met with patient to discuss her current care at home and identify any possible needs. Patient says she has 24/7 care at home; she has four children who stay with her, and she has aides through [facility] services who are with her 6 hours a day, 7 days a week. Patient requires assist with all of her ADLs, and has all equipment that she needs. No needs identified at this time, patient to return home with care in place.                Expected Discharge Plan: Home/Self Care Barriers to Discharge: Continued Medical Work up   Patient Goals and CMS Choice Patient states their goals for this hospitalization and ongoing recovery are:: to get back home CMS Medicare.gov Compare Post Acute Care list provided to:: Patient Choice offered to / list presented to : Patient  Expected Discharge Plan and Services Expected Discharge Plan: Home/Self Care       Living arrangements for the past 2 months: Single Family Home                                      Prior Living Arrangements/Services Living arrangements for the past 2 months: Single Family Home Lives with:: Adult Children Patient language and need for interpreter reviewed:: No Do you feel safe going back to the place where you live?: Yes      Need for Family Participation in Patient Care: No (Comment) Care giver support system in place?: Yes (comment) Current home services: Sitter Criminal Activity/Legal Involvement Pertinent to Current Situation/Hospitalization: No - Comment as needed  Activities of Daily Living      Permission Sought/Granted                  Emotional Assessment Appearance:: Appears stated age Attitude/Demeanor/Rapport: Engaged Affect  (typically observed): Pleasant Orientation: : Oriented to Self, Oriented to Place, Oriented to  Time, Oriented to Situation Alcohol / Substance Use: Not Applicable Psych Involvement: No (comment)  Admission diagnosis:  Acute ischemic stroke ([place]) [I63.9] Cerebrovascular accident (CVA), unspecified mechanism ([place]) [I63.9] Stroke [facility]) [I63.9] Patient Active Problem List   Diagnosis Date Noted  . Stroke ([place]) 12/25/2019  . Acute ischemic stroke ([place]) 12/24/2019  . Chronic diastolic (congestive) heart failure ([place]) 02/05/2019  . CKD (chronic kidney disease) stage 3, GFR 30-59 ml/min 02/05/2019  . Chronic anticoagulation 02/05/2019  . Palliative care encounter   . Septic shock ([place]) 12/29/2018  . Pressure injury of skin 10/11/2018  . Sepsis due to pneumonia ([place]) 10/10/2018  . Failure to thrive in adult 10/10/2018  . 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 Anasarca 10/10/2018  . HCAP (healthcare-associated pneumonia) 09/24/2018  . Acute metabolic encephalopathy 51/88/4166  . Metabolic acidosis 05/19/1600  . Mild persistent asthma 05/09/2018  . 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 Moderate malnutrition ([place]) 05/07/2018  . Paroxysmal atrial fibrillation with RVR ([place]) 05/07/2018  . Nausea & vomiting 05/06/2018  . Chronic anemia 05/06/2018  . PFO (patent foramen ovale) 02/15/2017  . History of CVA with residual deficit 02/15/2017  . Drug-induced hyperglycemia 02/15/2017  . Cerebrovascular accident (CVA) due to embolism ([place]) 10/26/2016  .  Sepsis ([place]) 02/29/2016  . Abdominal pain 11/07/2015  . Constipation 11/07/2015  . Hypomagnesemia 08/25/2015  . Diabetes mellitus type 2, controlled ([place]) 07/27/2015  . Chronic back pain   . Frequent falls   . Hypertensive heart disease   . Pulmonary hypertension ([place])   . Gout   . Microcytic anemia 01/26/2015  . History of seizure 01/26/2015   PCP:  Sandi Mariscal, MD Pharmacy:   [id number] [place], [place] - [id number]: [phone]: [phone]    Social Determinants of Health (SDOH) Interventions    Readmission Risk Interventions No flowsheet data found.

## 2019-12-26 NOTE — Evaluation (Signed)
Occupational Therapy Evaluation and Discharge Patient Details Name: Amber [name] MRN: [id number] DOB: January 02, 1956 Today's Date: 12/26/2019    History of Present Illness 64 yo admitted with increased confusion, drooling, word finding difficulty and unsteady gait with MRI demonstrating posterior RMCA infarct. PMHx: embolic CVA, UE DVT, PFO, anemia, asthma, DM, falls, HTN, GERD   Clinical Impression   This 64 yo admitted with above presents to acute OT with total A for bed mobility, in increased pain with all movement, reporting a PCA does all of her basic ADLs, but she can do some of her grooming post set up and feed herself post set up and she reports she does not get OOB. No skilled OT needs identified, we will sign off.    Follow Up Recommendations  No OT follow up;Supervision/Assistance - 24 hour    Equipment Recommendations  None recommended by OT       Precautions / Restrictions Precautions Precautions: Fall Precaution Comments: Per pt she is bed bound --does not get OOB Restrictions Weight Bearing Restrictions: No      Mobility Bed Mobility Overal bed mobility: Needs Assistance Bed Mobility: Rolling Rolling: Total assist            Transfers                 General transfer comment: unablet to tolerate due to pain--pt reports she does not get OOB at home        ADL either performed or assessed with clinical judgement   ADL Overall ADL's : Needs assistance/impaired Eating/Feeding: Set up;Bed level   Grooming: Wash/dry face;Set up;Bed level   Upper Body Bathing: Bed level;Maximal assistance   Lower Body Bathing: Total assistance;Bed level   Upper Body Dressing : Maximal assistance;Bed level   Lower Body Dressing: Total assistance;Bed level   Toilet Transfer: Total assistance;+2 for physical assistance Toilet Transfer Details (indicate cue type and reason): bed level Toileting- Clothing Manipulation and Hygiene: Total assistance;Bed level                Vision Patient Visual Report: No change from baseline              Pertinent Vitals/Pain Pain Assessment: Faces Faces Pain Scale: Hurts worst Pain Location: back with bed mobility Pain Descriptors / Indicators: Grimacing;Moaning Pain Intervention(s): Limited activity within patient's tolerance;Monitored during session;Repositioned     Hand Dominance Right   Extremity/Trunk Assessment Upper Extremity Assessment Upper Extremity Assessment: Generalized weakness           Communication Communication Communication: (She can answer questions, but sometimes hard to understand)   Cognition Arousal/Alertness: Awake/alert Behavior During Therapy: WFL for tasks assessed/performed Overall Cognitive Status: History of cognitive impairments - at baseline                                                Home Living Family/patient expects to be discharged to:: Private residence   Available Help at Discharge: Family;Personal care attendant Type of Home: Apartment                                  Prior Functioning/Environment Level of Independence: Needs assistance  Gait / Transfers Assistance Needed: Patient reports she does not ambulate and does not get OOB ADL's / Homemaking Assistance Needed: Pt reports  she can feed herself, but PCA helps her with all other ADLs            OT Problem List: Decreased strength;Decreased range of motion;Impaired balance (sitting and/or standing);Decreased activity tolerance;Impaired UE functional use;Pain         OT Goals(Current goals can be found in the care plan section) Acute Rehab OT Goals Patient Stated Goal: to not be moving due to pain  OT Frequency:             Co-evaluation PT/OT/SLP Co-Evaluation/Treatment: Yes(partial) Reason for Co-Treatment: To address functional/ADL transfers;For patient/therapist safety PT goals addressed during session: Mobility/safety with mobility OT goals  addressed during session: ADL's and self-care      AM-PAC OT "6 Clicks" Daily Activity     Outcome Measure Help from another person eating meals?: A Little(setup) Help from another person taking care of personal grooming?: A Lot Help from another person toileting, which includes using toliet, bedpan, or urinal?: Total Help from another person bathing (including washing, rinsing, drying)?: Total Help from another person to put on and taking off regular upper body clothing?: Total Help from another person to put on and taking off regular lower body clothing?: Total 6 Click Score: 9   End of Session Nurse Communication: (RN in room at time of session)  Activity Tolerance: Patient limited by pain Patient left: in bed;with call bell/phone within reach;with bed alarm set  OT Visit Diagnosis: Muscle weakness (generalized) (M62.81);Pain;Other abnormalities of gait and mobility (R26.89) Pain - part of body: (back)                Time: 8527-7824 OT Time Calculation (min): 13 min Charges:  OT General Charges $OT Visit: 1 Visit OT Evaluation $OT Eval Moderate Complexity: 1 Mod Cathy OTR/L Acute [employer] Pager [phone] Office [phone]     12/26/2019, 10:38 AM

## 2019-12-26 NOTE — Progress Notes (Signed)
Bilateral upper and lower extremity venous duplex exams completed.  Preliminary results can be found under CV proc under chart review.  12/26/2019 11:46 AM  Montee Tallman, K., RDMS, RVT

## 2019-12-26 NOTE — Evaluation (Signed)
Physical Therapy Evaluation/ Discharge Patient Details Name: Amber [name] MRN: [id number] DOB: Oct 22, 1956 Today's Date: 12/26/2019   History of Present Illness  64 yo admitted with increased confusion, drooling, word finding difficulty and unsteady gait with MRI demonstrating posterior RMCA infarct. PMHx: embolic CVA, UE DVT, PFO, anemia, asthma, DM, falls, HTN, GERD  Clinical Impression  Pt in room with RN on arrival. Pt stating she has assist of family and aide at home and that she cannot recall the last time she has been out of bed or stood. Pt reports she has to much pain in her back and all extremities and cannot tolerate getting up. Pt noted to have stool all over her from waist to knees with pt unaware and total assist for bed mobility for pt care and linen change. Pt not assisting with mobility other than very slight movement of bil UE and LE with changing gown and position. Pt reports this is her baseline and that family and aide can assist her. Pt with significant weakness, pain and total assist for mobility. Attempted to contact family via phone without answer. Pt at baseline total assist without further therapy needs at this time given such limited PLOF. SNF recommend if family unable to continue to provide total care. If family needs DME for mobility hoyer lift and WC recommended. Will sign off.      Follow Up Recommendations SNF;Supervision for mobility/OOB    Equipment Recommendations  None recommended by PT    Recommendations for Other Services       Precautions / Restrictions Precautions Precautions: Fall Precaution Comments: Per pt she is bed bound --does not get OOB      Mobility  Bed Mobility Overal bed mobility: Needs Assistance Bed Mobility: Rolling Rolling: Total assist         General bed mobility comments: total assist to roll bil for pericare and linen change. total +2 to slide toward [facility] and place bed in semi chair position  Transfers                  General transfer comment: unable to tolerate due to pain--pt reports she does not get OOB at home  Ambulation/Gait                Stairs            Wheelchair Mobility    Modified Rankin (Stroke Patients Only) Modified Rankin (Stroke Patients Only) Pre-Morbid Rankin Score: Severe disability Modified Rankin: Severe disability     Balance                                             Pertinent Vitals/Pain Pain Assessment: Faces Pain Score: 10-Worst pain ever Faces Pain Scale: Hurts worst Pain Location: back with bed mobility Pain Descriptors / Indicators: Grimacing;Moaning Pain Intervention(s): Limited activity within patient's tolerance;Monitored during session;Premedicated before session;Repositioned    Home Living Family/patient expects to be discharged to:: Private residence Living Arrangements: Other relatives;Children Available Help at Discharge: Family;Personal care attendant;Available 24 hours/day Type of Home: Apartment       Home Layout: One level Home Equipment: Hospital bed      Prior Function Level of Independence: Needs assistance   Gait / Transfers Assistance Needed: Patient reports she does not ambulate and does not get OOB  ADL's / Homemaking Assistance Needed: Pt reports she can feed herself, but PCA  helps her with all other ADLs        Hand Dominance   Dominant Hand: Right    Extremity/Trunk Assessment   Upper Extremity Assessment Upper Extremity Assessment: Generalized weakness    Lower Extremity Assessment Lower Extremity Assessment: RLE deficits/detail;LLE deficits/detail RLE Deficits / Details: grossly 2/5 strength  very slow to move with great effort to bend hip/knee and straighten very limited ROM LLE Deficits / Details: grossly 2/5 strength  very slow to move with great effort to bend hip/knee and straighten very limited ROM    Cervical / Trunk Assessment Cervical / Trunk Assessment: Kyphotic   Communication   Communication: (She can answer questions, but sometimes hard to understand)  Cognition Arousal/Alertness: Awake/alert Behavior During Therapy: WFL for tasks assessed/performed Overall Cognitive Status: No family/caregiver present to determine baseline cognitive functioning                                        General Comments      Exercises     Assessment/Plan    PT Assessment Patent does not need any further PT services  PT Problem List         PT Treatment Interventions      PT Goals (Current goals can be found in the Care Plan section)  Acute Rehab PT Goals Patient Stated Goal: to be left alone due to pain PT Goal Formulation: All assessment and education complete, DC therapy    Frequency     Barriers to discharge        Co-evaluation PT/OT/SLP Co-Evaluation/Treatment: Yes Reason for Co-Treatment: For patient/therapist safety;Complexity of the patient's impairments (multi-system involvement) PT goals addressed during session: Mobility/safety with mobility OT goals addressed during session: ADL's and self-care       AM-PAC PT "6 Clicks" Mobility  Outcome Measure Help needed turning from your back to your side while in a flat bed without using bedrails?: Total Help needed moving from lying on your back to sitting on the side of a flat bed without using bedrails?: Total Help needed moving to and from a bed to a chair (including a wheelchair)?: Total Help needed standing up from a chair using your arms (e.g., wheelchair or bedside chair)?: Total Help needed to walk in hospital room?: Total Help needed climbing 3-5 steps with a railing? : Total 6 Click Score: 6    End of Session   Activity Tolerance: Patient limited by pain Patient left: in bed;with call bell/phone within reach;with bed alarm set;with nursing/sitter in room Nurse Communication: Mobility status;Need for lift equipment PT Visit Diagnosis: Other abnormalities of  gait and mobility (R26.89);Difficulty in walking, not elsewhere classified (R26.2);Muscle weakness (generalized) (M62.81)    Time: 2458-0998 PT Time Calculation (min) (ACUTE ONLY): 23 min   Charges:   PT Evaluation $PT Eval Moderate Complexity: 1 Mod          Felicie Kocher P, PT Acute Rehabilitation Services Pager: [phone] Office: [phone]   Enedina Finner Shulem Mader 12/26/2019, 12:15 PM

## 2019-12-26 NOTE — Discharge Instructions (Signed)
Information on my medicine - ELIQUIS (apixaban)  This medication education was reviewed with me or my healthcare representative as part of my discharge preparation.  The pharmacist that spoke with me during my hospital stay was:  Leander Rams, [facility]  Why was Eliquis prescribed for you? Eliquis was prescribed to treat blood clots that may have been found in the veins of your legs (deep vein thrombosis) or in your lungs (pulmonary embolism) and to reduce the risk of them occurring again.  What do You need to know about Eliquis ? The dose is ONE 5 mg tablet taken TWICE daily.  Eliquis may be taken with or without food.   Try to take the dose about the same time in the morning and in the evening. If you have difficulty swallowing the tablet whole please discuss with your pharmacist how to take the medication safely.  Take Eliquis exactly as prescribed and DO NOT stop taking Eliquis without talking to the doctor who prescribed the medication.  Stopping may increase your risk of developing a new blood clot.  Refill your prescription before you run out.  After discharge, you should have regular check-up appointments with your healthcare provider that is prescribing your Eliquis.    What do you do if you miss a dose? If a dose of ELIQUIS is not taken at the scheduled time, take it as soon as possible on the same day and twice-daily administration should be resumed. The dose should not be doubled to make up for a missed dose.  Important Safety Information A possible side effect of Eliquis is bleeding. You should call your healthcare provider right away if you experience any of the following: ? Bleeding from an injury or your nose that does not stop. ? Unusual colored urine (red or dark brown) or unusual colored stools (red or black). ? Unusual bruising for unknown reasons. ? A serious fall or if you hit your head (even if there is no bleeding).  Some medicines may interact with Eliquis and  might increase your risk of bleeding or clotting while on Eliquis. To help avoid this, consult your healthcare provider or pharmacist prior to using any new prescription or non-prescription medications, including herbals, vitamins, non-steroidal anti-inflammatory drugs (NSAIDs) and supplements.  This website has more information on Eliquis (apixaban): http://www.eliquis.com/eliquis/home

## 2019-12-26 NOTE — Progress Notes (Signed)
Vomited mod amount of undigested food.  Said she felt very full and had an upset stomach.  MD was paged and order was received for zofran.

## 2019-12-26 NOTE — Progress Notes (Signed)
STROKE TEAM PROGRESS NOTE   INTERVAL HISTORY Vascular tech is in the patient's room.  Lower extremity venous Dopplers were negative and subclavian Dopplers are being done.  Carotid ultrasound showed no significant extracranial stenosis.  Patient has no new complaints today.  She is neurologically stable.  Transcranial Doppler study was suboptimal due to patient lack of cooperation and poor windows.  Vitals:   12/26/19 0326 12/26/19 0826 12/26/19 0853 12/26/19 1300  BP: (!) 133/94 (!) 135/112 132/88 128/84  Pulse: 85 82  100  Resp: Temp: 98.1 F (36.7 C) 98.2 F (36.8 C)  98.9 F (37.2 C)  TempSrc: Axillary Oral  Oral  SpO2: 100% 100%  100%  Weight:      Height:        CBC:  Recent Labs  Lab 12/24/19 1845 12/25/19 0359  WBC 13.1* 11.1*  NEUTROABS 5.6  --   HGB 9.5* 8.3*  HCT 28.5* 24.3*  MCV 79.6* 78.1*  PLT 185 163    Basic Metabolic Panel:  Recent Labs  Lab 12/24/19 1845 12/25/19 0359  NA 136 141  K 3.7 3.8  CL 110 114*  CO2 20* 20*  GLUCOSE 99 87  BUN 11 9  CREATININE 0.83 0.78  CALCIUM 8.1* 7.9*   Lipid Panel:     Component Value Date/Time   CHOL 120 12/25/2019 0359   TRIG 111 12/25/2019 0359   HDL 52 12/25/2019 0359   CHOLHDL 2.3 12/25/2019 0359   VLDL 22 12/25/2019 0359   LDLCALC 46 12/25/2019 0359   HgbA1c:  Lab Results  Component Value Date   HGBA1C 4.4 (L) 12/25/2019   Urine Drug Screen:     Component Value Date/Time   LABOPIA POSITIVE (A) 12/25/2019 0510   COCAINSCRNUR NONE DETECTED 12/25/2019 0510   LABBENZ NONE DETECTED 12/25/2019 0510   AMPHETMU NONE DETECTED 12/25/2019 0510   THCU NONE DETECTED 12/25/2019 0510   LABBARB NONE DETECTED 12/25/2019 0510    IMAGING past 48 hours CT Head Wo Contrast  Result Date: 12/24/2019 CLINICAL DATA:  Confusion EXAM: CT HEAD WITHOUT CONTRAST TECHNIQUE: Contiguous axial images were obtained from the base of the skull through the vertex without intravenous contrast. COMPARISON:   10/10/2018 FINDINGS: Brain: Chronic atrophic changes are noted. 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 There is a new area of wedge like decreased attenuation identified in the posterior aspect of the right parietal lobe consistent with subacute ischemia. No other findings to suggest acute infarct are noted. Scattered chronic white matter ischemic changes are seen. Vascular: No hyperdense vessel or unexpected calcification. Skull: Normal. Negative for fracture or focal lesion. Sinuses/Orbits: No acute finding. Other: None. IMPRESSION: Chronic atrophic and ischemic changes. New area of decreased attenuation in the posterior parietal lobe consistent with subacute ischemia. This would correspond with the patient's recent history. Electronically Signed   By: Alcide Clever M.D.   On: 12/24/2019 19:06   MR ANGIO HEAD WO CONTRAST  Result Date: 12/25/2019 CLINICAL DATA:  Stroke follow-up. Increased confusion, drooling, left hand numbness, and word-finding difficulty. EXAM: MRI HEAD WITHOUT CONTRAST MRA HEAD WITHOUT CONTRAST TECHNIQUE: Multiplanar, multiecho pulse sequences of the brain and surrounding structures were obtained without intravenous contrast. Angiographic images of the head were obtained using MRA technique without contrast. COMPARISON:  Head CT 12/24/2019, MRI 11/11/2018, and MRA 11/12/2018 FINDINGS: MRI HEAD FINDINGS Brain: There is a moderate-sized region of restricted diffusion in the posterior right MCA territory involving the temporoparietal junction consistent with an acute to  early subacute infarct. There is a small amount of associated petechial hemorrhage. Chronic bilateral frontoparietal cortical infarcts are noted with associated chronic blood products. Patchy to confluent T2 hyperintensities elsewhere in the cerebral white matter bilaterally and in the pons are similar to the prior MRI and nonspecific but compatible with extensive chronic small vessel ischemic disease. Multiple chronic infarcts  are again noted in the cerebellum bilaterally as well as in the thalami and basal ganglia. There is moderate cerebral atrophy. No mass, midline shift, or extra-axial fluid collection is identified. Vascular: Evaluated below. Skull and upper cervical spine: No suspicious marrow lesion. Sinuses/Orbits: Unremarkable orbits. Paranasal sinuses and mastoid air cells are clear. Other: None. MRA HEAD FINDINGS The visualized distal right vertebral artery is patent and supplies the basilar. 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 A patent right PICA is partially visualized. Patent AICAs and SCAs are also seen bilaterally. The basilar artery is patent without evidence of significant stenosis. There is a fetal origin of both PCAs without evidence of significant proximal PCA stenosis. The internal carotid arteries are widely patent from skull base to carotid termini. ACAs and MCAs are patent without evidence of proximal branch occlusion or significant proximal stenosis. No aneurysm is identified. IMPRESSION: 1. 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 2. Extensive chronic small vessel ischemic disease with multiple chronic infarcts as above. 3. No acute large vessel occlusion or significant proximal intracranial stenosis. Electronically Signed   By: Sebastian Ache M.D.   On: 12/25/2019 12:11   MR BRAIN WO CONTRAST  Result Date: 12/25/2019 CLINICAL DATA:  Stroke follow-up. Increased confusion, drooling, left hand numbness, and word-finding difficulty. EXAM: MRI HEAD WITHOUT CONTRAST MRA HEAD WITHOUT CONTRAST TECHNIQUE: Multiplanar, multiecho pulse sequences of the brain and surrounding structures were obtained without intravenous contrast. Angiographic images of the head were obtained using MRA technique without contrast. COMPARISON:  Head CT 12/24/2019, MRI  11/11/2018, and MRA 11/12/2018 FINDINGS: MRI HEAD FINDINGS Brain: There is a moderate-sized region of restricted diffusion in the posterior right MCA territory involving the temporoparietal junction consistent with an acute to early subacute infarct. There is a small amount of associated petechial hemorrhage. Chronic bilateral frontoparietal cortical infarcts are noted with associated chronic blood products. Patchy to confluent T2 hyperintensities elsewhere in the cerebral white matter bilaterally and in the pons are similar to the prior MRI and nonspecific but compatible with extensive chronic small vessel ischemic disease. Multiple chronic infarcts are again noted in the cerebellum bilaterally as well as in the thalami and basal ganglia. There is moderate cerebral atrophy. No mass, midline shift, or extra-axial fluid collection is identified. Vascular: Evaluated below. Skull and upper cervical spine: No suspicious marrow lesion. Sinuses/Orbits: Unremarkable orbits. Paranasal sinuses and mastoid air cells are clear. Other: None. MRA HEAD FINDINGS The visualized distal right vertebral artery is patent and supplies the basilar. 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 A patent right PICA is partially visualized. Patent AICAs and SCAs are also seen bilaterally. The basilar artery is patent without evidence of significant stenosis. There is a fetal origin of both PCAs without evidence of significant proximal PCA stenosis. The internal carotid arteries are widely patent from skull base to carotid  termini. ACAs and MCAs are patent without evidence of proximal branch occlusion or significant proximal stenosis. No aneurysm is identified. IMPRESSION: 1. 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 2. Extensive chronic small vessel ischemic disease with multiple  chronic infarcts as above. 3. No acute large vessel occlusion or significant proximal intracranial stenosis. Electronically Signed   By: Sebastian Ache M.D.   On: 12/25/2019 12:11   DG Chest Portable 1 View  Result Date: 12/24/2019 CLINICAL DATA:  Increased confusion EXAM: PORTABLE CHEST 1 VIEW COMPARISON:  01/15/2019 FINDINGS: Cardiac shadow is enlarged but stable. Postsurgical changes are noted in the cervicothoracic spine. The lungs are clear bilaterally. No focal infiltrate or sizable effusion is seen. Old rib fractures are noted on the right with healing. IMPRESSION: No acute abnormality noted. Electronically Signed   By: Alcide Clever M.D.   On: 12/24/2019 19:07   ECHOCARDIOGRAM COMPLETE  Result Date: 12/25/2019   ECHOCARDIOGRAM REPORT   Patient Name:   VY BADLEY Date of Exam: 12/25/2019 Medical Rec #:  [id number]      Height:       60.0 in Accession #:    [id number]     Weight:       105.0 lb Date of Birth:  Mar 19, 1956      BSA:          1.42 m Patient Age:    63 years       BP:           121/111 mmHg Patient Gender: F              HR:           101 bpm. Exam Location:  Inpatient Procedure: 2D Echo, Cardiac Doppler, Color Doppler and Intracardiac            Opacification Agent Indications:    Stroke 434.91  History:        Patient has prior history of Echocardiogram examinations, most                 recent 01/14/2019. Stroke and Pulmonary HTN; Risk                 Factors:Hypertension, Diabetes and Former Smoker. PFO.  Sonographer:    Tonia Ghent RDCS Referring Phys: [id number] STEPHEN A PETTY IMPRESSIONS  1. Left ventricular ejection fraction, by visual estimation, is 60 to 65%. The left ventricle has normal function. There is moderately increased left ventricular hypertrophy.  2. Definity contrast agent was given IV to delineate the left ventricular endocardial borders.  3. Left ventricular diastolic parameters are consistent with Grade I diastolic dysfunction (impaired relaxation).  4. The left  ventricle has no regional wall motion abnormalities.  5. Global right ventricle has normal systolic function.The right ventricular size is normal. No increase in right ventricular wall thickness.  6. Left atrial size was normal.  7. Right atrial size was normal.  8. Mild mitral annular calcification.  9. Moderate calcification of the mitral valve leaflet(s). 10. Moderate thickening of the mitral valve leaflet(s). 11. The mitral valve is normal in structure. Trivial mitral valve regurgitation. No evidence of mitral stenosis. 12. The tricuspid valve is normal in structure. 13. The tricuspid valve is normal in structure. Tricuspid valve regurgitation is not demonstrated. 14. The aortic valve is tricuspid. Aortic valve regurgitation is not visualized. Mild aortic valve sclerosis without stenosis. 15. The pulmonic valve was normal in structure. Pulmonic valve regurgitation is not visualized. 16. The inferior vena  cava is normal in size with greater than 50% respiratory variability, suggesting right atrial pressure of 3 mmHg. 17. The interatrial septum was not well visualized. FINDINGS  Left Ventricle: Left ventricular ejection fraction, by visual estimation, is 60 to 65%. The left ventricle has normal function. Definity contrast agent was given IV to delineate the left ventricular endocardial borders. The left ventricle has no regional wall motion abnormalities. There is moderately increased left ventricular hypertrophy. Left ventricular diastolic parameters are consistent with Grade I diastolic dysfunction (impaired relaxation). Normal left atrial pressure. Right Ventricle: The right ventricular size is normal. No increase in right ventricular wall thickness. Global RV systolic function is has normal systolic function. Left Atrium: Left atrial size was normal in size. Right Atrium: Right atrial size was normal in size Pericardium: There is no evidence of pericardial effusion. Mitral Valve: The mitral valve is normal in  structure. There is moderate thickening of the mitral valve leaflet(s). There is moderate calcification of the mitral valve leaflet(s). Mild mitral annular calcification. Trivial mitral valve regurgitation. No evidence of mitral valve stenosis by observation. Tricuspid Valve: The tricuspid valve is normal in structure. Tricuspid valve regurgitation is not demonstrated. Aortic Valve: The aortic valve is tricuspid. Aortic valve regurgitation is not visualized. Mild aortic valve sclerosis is present, with no evidence of aortic valve stenosis. Pulmonic Valve: The pulmonic valve was normal in structure. Pulmonic valve regurgitation is not visualized. Pulmonic regurgitation is not visualized. Aorta: The aortic root, ascending aorta and aortic arch are all structurally normal, with no evidence of dilitation or obstruction. Venous: The inferior vena cava is normal in size with greater than 50% respiratory variability, suggesting right atrial pressure of 3 mmHg. IAS/Shunts: The interatrial septum was not well visualized. There is no evidence of a patent foramen ovale. No ventricular septal defect is seen or detected. There is no evidence of an atrial septal defect.  LEFT VENTRICLE PLAX 2D LVIDd:         3.00 cm  Diastology LVIDs:         2.20 cm  LV e' lateral:   3.97 cm/s LV PW:         1.30 cm  LV E/e' lateral: 13.0 LV IVS:        1.30 cm  LV e' medial:    2.92 cm/s LVOT diam:     2.00 cm  LV E/e' medial:  17.7 LV SV:         19 ml LV SV Index:   13.23 LVOT Area:     3.14 cm  RIGHT VENTRICLE RV S prime:     9.65 cm/s TAPSE (M-mode): 1.3 cm LEFT ATRIUM             Index LA diam:        2.30 cm 1.62 cm/m LA Vol (A2C):   22.5 ml 15.85 ml/m LA Vol (A4C):   27.6 ml 19.44 ml/m LA Biplane Vol: 26.6 ml 18.74 ml/m  AORTIC VALVE LVOT Vmax:   84.20 cm/s LVOT Vmean:  54.000 cm/s LVOT VTI:    0.142 m  AORTA Ao Asc diam: 3.00 cm MITRAL VALVE MV Area (PHT): 5.13 cm             SHUNTS MV PHT:        42.92 msec           Systemic  VTI:  0.14 m MV Decel Time: 148 msec             Systemic  Diam: 2.00 cm MV E velocity: 51.80 cm/s 103 cm/s MV A velocity: 66.00 cm/s 70.3 cm/s MV E/A ratio:  0.78       1.5  Charlton Haws MD Electronically signed by Charlton Haws MD Signature Date/Time: 12/25/2019/10:26:14 AM    Final    VAS US CAROTID (at [facility] and [facility] only)  Result Date: 12/26/2019 Carotid Arterial Duplex Study Indications:       CVA. Risk Factors:      Hypertension, Diabetes, prior CVA. Limitations        Today's exam was limited due to the patient's inability or                    unwillingness to cooperate. Comparison Study:  no prior Performing Technologist: Blanch Media RVS  Examination Guidelines: A complete evaluation includes B-mode imaging, spectral Doppler, color Doppler, and power Doppler as needed of all accessible portions of each vessel. Bilateral testing is considered an integral part of a complete examination. Limited examinations for reoccurring indications may be performed as noted.  Right Carotid Findings: +----------+--------+--------+--------+------------------+--------------+           PSV cm/sEDV cm/sStenosisPlaque DescriptionComments       +----------+--------+--------+--------+------------------+--------------+ CCA Prox  60      26              heterogenous                     +----------+--------+--------+--------+------------------+--------------+ CCA Distal60      23              heterogenous                     +----------+--------+--------+--------+------------------+--------------+ ICA Prox  56      22      1-39%   heterogenous                     +----------+--------+--------+--------+------------------+--------------+ ICA Distal57      26                                               +----------+--------+--------+--------+------------------+--------------+ ECA                                                 Not visualized  +----------+--------+--------+--------+------------------+--------------+ +----------+--------+-------+--------+-------------------+           PSV cm/sEDV cmsDescribeArm Pressure (mmHG) +----------+--------+-------+--------+-------------------+ [id number]                                         +----------+--------+-------+--------+-------------------+ +---------+--------+--+--------+--+---------+ VertebralPSV cm/s59EDV cm/s24Antegrade +---------+--------+--+--------+--+---------+  Left Carotid Findings: +----------+--------+--------+--------+------------------+--------+           PSV cm/sEDV cm/sStenosisPlaque DescriptionComments +----------+--------+--------+--------+------------------+--------+ CCA Prox  58      20              heterogenous               +----------+--------+--------+--------+------------------+--------+ CCA Distal48      18              heterogenous               +----------+--------+--------+--------+------------------+--------+  ICA Prox  95      42      40-59%  heterogenous      tortuous +----------+--------+--------+--------+------------------+--------+ ICA Distal77      38                                         +----------+--------+--------+--------+------------------+--------+ ECA       33      14                                         +----------+--------+--------+--------+------------------+--------+ +----------+--------+--------+--------+-------------------+           PSV cm/sEDV cm/sDescribeArm Pressure (mmHG) +----------+--------+--------+--------+-------------------+ [id number]                                          +----------+--------+--------+--------+-------------------+ +---------+--------+--------+--------------+ VertebralPSV cm/sEDV cm/sNot identified +---------+--------+--------+--------------+   Summary: Right Carotid: Velocities in the right ICA are consistent with a 1-39% stenosis. Left  Carotid: Velocities in the left ICA are consistent with a 40-59% stenosis. Vertebrals: Right vertebral artery demonstrates antegrade flow. Left vertebral             artery was not visualized. *See table(s) above for measurements and observations.  Electronically signed by Delia Heady MD on 12/26/2019 at 12:32:27 PM.    Final    VAS [place] LOWER EXTREMITY VENOUS (DVT)  Result Date: 12/26/2019  Lower Venous DVTStudy Indications: Stroke.  Anticoagulation: Eliquis. Limitations: Poor ultrasound/tissue interface and Patient uncooperative. Comparison Study: Bilateral LEV 11-25-16. Performing Technologist: Kennedy Bucker ARDMS, RVT  Examination Guidelines: A complete evaluation includes B-mode imaging, spectral Doppler, color Doppler, and power Doppler as needed of all accessible portions of each vessel. Bilateral testing is considered an integral part of a complete examination. Limited examinations for reoccurring indications may be performed as noted. The reflux portion of the exam is performed with the patient in reverse Trendelenburg.  +---------+---------------+---------+-----------+----------+------------------+ RIGHT    CompressibilityPhasicitySpontaneityPropertiesThrombus Aging     +---------+---------------+---------+-----------+----------+------------------+ CFV      Full           Yes      Yes                                     +---------+---------------+---------+-----------+----------+------------------+ SFJ      Full                                                            +---------+---------------+---------+-----------+----------+------------------+ FV Prox  Full                                                            +---------+---------------+---------+-----------+----------+------------------+ FV Mid   Full                                                            +---------+---------------+---------+-----------+----------+------------------+  FV DistalFull                                                             +---------+---------------+---------+-----------+----------+------------------+ PFV      Full                                                            +---------+---------------+---------+-----------+----------+------------------+ POP      Full           Yes      Yes                                     +---------+---------------+---------+-----------+----------+------------------+ PTV      Full                                         Limited                                                                  visualization      +---------+---------------+---------+-----------+----------+------------------+ PERO     Full                                         Limited                                                                  visualization      +---------+---------------+---------+-----------+----------+------------------+   +---------+---------------+---------+-----------+----------+------------------+ LEFT     CompressibilityPhasicitySpontaneityPropertiesThrombus Aging     +---------+---------------+---------+-----------+----------+------------------+ CFV      Full           Yes      Yes                                     +---------+---------------+---------+-----------+----------+------------------+ SFJ      Full                                                            +---------+---------------+---------+-----------+----------+------------------+ FV Prox  Full                                                            +---------+---------------+---------+-----------+----------+------------------+  FV Mid   Full                                                            +---------+---------------+---------+-----------+----------+------------------+ FV DistalFull                                                             +---------+---------------+---------+-----------+----------+------------------+ PFV      Full                                                            +---------+---------------+---------+-----------+----------+------------------+ POP      Full           Yes      Yes                                     +---------+---------------+---------+-----------+----------+------------------+ PTV      Full                                         Limited                                                                  visualization      +---------+---------------+---------+-----------+----------+------------------+ PERO     Full                                         Limited                                                                  visualization      +---------+---------------+---------+-----------+----------+------------------+   Left Technical Findings: Limited visualization of veins due to overlying shadow from calcified arteries. Poorly visualized bilateral calf veins.   Summary: BILATERAL: - No evidence of deep vein thrombosis seen in the lower extremities, bilaterally.  RIGHT: - No cystic structure found in the popliteal fossa.  LEFT: - No cystic structure found in the popliteal fossa.  *See table(s) above for measurements and observations.    Preliminary    VAS [place] UPPER EXTREMITY VENOUS DUPLEX  Result Date: 12/26/2019 UPPER VENOUS STUDY  Indications: stroke Anticoagulation: Eliquis. Limitations: Patient uncooperative. Comparison Study: Upper extremity venous duplex 10-27-16. Performing Technologist:  Kristy Siebrecht RDMS, RVT  Examination Guidelines: A complete evaluation includes B-mode imaging, spectral Doppler, color Doppler, and power Doppler as needed of all accessible portions of each vessel. Bilateral testing is considered an integral part of a complete examination. Limited examinations for reoccurring indications may be performed as noted.  Right Findings:  +----------+------------+---------+-----------+----------+--------------+ RIGHT     CompressiblePhasicitySpontaneousProperties   Summary     +----------+------------+---------+-----------+----------+--------------+ IJV           Full       Yes       Yes                             +----------+------------+---------+-----------+----------+--------------+ Subclavian    Full       Yes       Yes                             +----------+------------+---------+-----------+----------+--------------+ Axillary      Full       Yes       Yes                             +----------+------------+---------+-----------+----------+--------------+ Brachial      Full       Yes       Yes                             +----------+------------+---------+-----------+----------+--------------+ Radial                                              Not visualized +----------+------------+---------+-----------+----------+--------------+ Ulnar                                               Not visualized +----------+------------+---------+-----------+----------+--------------+ Cephalic      Full                                                 +----------+------------+---------+-----------+----------+--------------+ Basilic       Full                                                 +----------+------------+---------+-----------+----------+--------------+  Left Findings: +----------+------------+---------+-----------+----------+--------------+ LEFT      CompressiblePhasicitySpontaneousProperties   Summary     +----------+------------+---------+-----------+----------+--------------+ IJV           Full       Yes       Yes                             +----------+------------+---------+-----------+----------+--------------+ Subclavian    Full       Yes       Yes                             +----------+------------+---------+-----------+----------+--------------+  Axillary       Full       Yes       Yes                             +----------+------------+---------+-----------+----------+--------------+ Brachial      Full       Yes       Yes                             +----------+------------+---------+-----------+----------+--------------+ Radial                                              Not visualized +----------+------------+---------+-----------+----------+--------------+ Ulnar                                               Not visualized +----------+------------+---------+-----------+----------+--------------+ Cephalic      Full                                                 +----------+------------+---------+-----------+----------+--------------+ Basilic       Full                                                 +----------+------------+---------+-----------+----------+--------------+ Limited exam, bilateral forearm veins not visualized due to uncooperative patient.  Summary: No evidence of deep vein or superficial vein thrombosis involving the right and left upper extremities.  *See table(s) above for measurements and observations.    Preliminary    VAS [place] TRANSCRANIAL DOPPLER  Result Date: 12/26/2019  Transcranial Doppler Indications: Stroke. Limitations: Uncooperative patient Limitations for diagnostic windows: Unable to insonate right transtemporal window. Unable to insonate left transtemporal window. Comparison Study: no prior Performing Technologist: Blanch Media RVS  Examination Guidelines: A complete evaluation includes B-mode imaging, spectral Doppler, color Doppler, and power Doppler as needed of all accessible portions of each vessel. Bilateral testing is considered an integral part of a complete examination. Limited examinations for reoccurring indications may be performed as noted.  +---------+-------------+----------+-----------+-------+ RIGHT TCDRight VM (cm)Depth (cm)PulsatilityComment  +---------+-------------+----------+-----------+-------+ Opthalmic    10.00                 1.26            +---------+-------------+----------+-----------+-------+  +---------+------------+----------+-----------+-------+ LEFT TCD Left VM (cm)Depth (cm)PulsatilityComment +---------+------------+----------+-----------+-------+ Opthalmic   11.00                 1.39            +---------+------------+----------+-----------+-------+  Summary:  Suboptimal highly limites study due to patient`s lack of cooperation and poor windows throughout. antegrade flow in both opthalic arteries . *See table(s) above for TCD measurements and observations.  Diagnosing physician: Delia Heady MD Electronically signed by Delia Heady MD on 12/26/2019 at 12:33:49 PM.    Final     PHYSICAL EXAM Pleasant middle-aged African-American lady not in distress. . Afebrile. Head is nontraumatic.  Neck is supple without bruit.    Cardiac exam no murmur or gallop. Lungs are clear to auscultation. Distal pulses are well felt. Neurological Exam : Awake alert oriented x2.  Diminished attention, registration and recall.  Speech is clear without aphasia or apraxia.  Extraocular movements appear full range without nystagmus.  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  Face is symmetric without weakness.  Tongue is midline.  Motor system exam shows mild left hemiparesis 4/5 but her cooperation is limited due to pain in her back.  Good strength on the right side.  Sensation appears mildly diminished on the left compared to the right.  Deep tendon reflexes are brisker on the left compared to the right.  Plantars are equivocal. ASSESSMENT/PLAN Ms. Kseniya Grunden is a 64 y.o. female with history of embolic strokes, PFO and UE DVTs on Eliquis presenting to [facility] ED  with increased confusion, forgetfulness, drooling.   Stroke:   New posterior R MCA infarct embolic secondary to unclear source.  Suspicious for recurrent DVT given missed doses of Eliquis just prior to admission, known PFO. Workup underway  CT head  New posterior parietal subacute lobe infarct. Small vessel disease. Atrophy.   MRI  Moderate acute to early subacute posterior R MCA infarct. Extensive small vessel disease. Multiple chronic infarcts.   MRA  No LVO or significant stenosis   Carotid Doppler 40 to 59% left ICA and 1-39% right ICA stenosis upper extremity doppler pending    Lower extremity doppler pending    Transcranial doppler poor windows bilaterally.  Only ophthalmic arteries could be instrumented and have antegrade flow.  2D Echo EF 60-65%. No source of embolus   TEE in 2017 positive for PFO  LDL 46  HgbA1c 4.4  Eliquis for VTE prophylaxis  Eliquis (apixaban) daily 2.5 bid prior to admission for recurrent DVT, now on Eliquis (apixaban) daily 2.5 bid. Missed several (4) doses prior to admission. May need to increase dose if has an acute DVT.   Therapy recommendations: Skilled nursing facility disposition:   pending   Hypertension  Stable . Permissive hypertension (OK if < 220/120) but gradually normalize in 5-7 days . Long-term BP goal normotensive  Hyperlipidemia  Home meds:  lipitor 10, resumed in hospital  LDL 46, goal < 70  Will not do intensive statin given low LDL on lipitor 10  Continue statin at discharge  Diabetes type II Controlled  HgbA1c 5.4, goal < 7.0  Other Stroke Risk Factors  Former Cigarette smoker  Hx stroke/TIA  10/2016 numerous supra and infratentorial infarct across multiple territories. Largest in R frontoparietal lobes. Embolic. Found to have PFO. Planned for PFO closure. Hypercoagulable workup neg x slightly elevated homocysteine. Later found to have UE DVT and placed on eliquis.    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 Had missed several doses prior to admission    Other Active Problems  UTI w/ abnormal UA. On Rocephin  Oral thrush  Chronic  steroid use, 5 mg  Chronic back pain, on home opiates  Hospital day # 1    She presented with a few day history of confusion and speech difficulty secondary to embolic right posterior parietal infarct.  She has recurrent history of DVTs and has not been compliant with her anticoagulation prophylactic regimen.  She also has a PFO which could have contributed to paradoxical embolism.  Both lower and upper extremity venous Dopplers are negative for DVT and if present may  need full dose anticoagulation.  Endovascular PFO closure was considered in the past but not done but given her noncompliance history will not be a good idea.  Continue Eliquis in the DVT prevention dose of 2.5 twice daily for now patient counseled to be compliant with her medications.  Discussed with Dr. Dayna Barker.  Greater than 50% time during this 25-minute visit was spent on counseling and coordination of care about embolic stroke and discussion about DVT, PFO and medication compliance and answering questions Follow-up as an outpatient with stroke clinic in 6 weeks.  Stroke team will sign off.  Kindly call for questions. Delia Heady, MD Medical Director [facility] Pager: [phone] 12/26/2019 1:11 PM   To contact Stroke Continuity provider, please refer to [email or web address]. After hours, contact General Neurology

## 2019-12-27 ENCOUNTER — Encounter (HOSPITAL_COMMUNITY): Payer: Self-pay

## 2019-12-27 LAB — VITAMIN B12: Vitamin B-12: 883 pg/mL (ref 180–914)

## 2019-12-27 LAB — RETICULOCYTES
Immature Retic Fract: 17 % — ABNORMAL HIGH (ref 2.3–15.9)
RBC.: 3.2 MIL/uL — ABNORMAL LOW (ref 3.87–5.11)
Retic Count, Absolute: 58.2 10*3/uL (ref 19.0–186.0)
Retic Ct Pct: 1.8 % (ref 0.4–3.1)

## 2019-12-27 LAB — IRON AND TIBC: Iron: 47 ug/dL (ref 28–170)

## 2019-12-27 LAB — CBC
HCT: 23.5 % — ABNORMAL LOW (ref 36.0–46.0)
Hemoglobin: 8.1 g/dL — ABNORMAL LOW (ref 12.0–15.0)
MCH: 26.9 pg (ref 26.0–34.0)
MCHC: 34.5 g/dL (ref 30.0–36.0)
MCV: 78.1 fL — ABNORMAL LOW (ref 80.0–100.0)
Platelets: 140 10*3/uL — ABNORMAL LOW (ref 150–400)
RBC: 3.01 MIL/uL — ABNORMAL LOW (ref 3.87–5.11)
RDW: 16.3 % — ABNORMAL HIGH (ref 11.5–15.5)
WBC: 5.7 10*3/uL (ref 4.0–10.5)
nRBC: 0 % (ref 0.0–0.2)

## 2019-12-27 LAB — FERRITIN: Ferritin: 606 ng/mL — ABNORMAL HIGH (ref 11–307)

## 2019-12-27 LAB — URINE CULTURE

## 2019-12-27 LAB — BASIC METABOLIC PANEL
Anion gap: 13 (ref 5–15)
BUN: 13 mg/dL (ref 8–23)
CO2: 16 mmol/L — ABNORMAL LOW (ref 22–32)
Calcium: 7.7 mg/dL — ABNORMAL LOW (ref 8.9–10.3)
Chloride: 109 mmol/L (ref 98–111)
Creatinine, Ser: 1.17 mg/dL — ABNORMAL HIGH (ref 0.44–1.00)
GFR calc Af Amer: 58 mL/min — ABNORMAL LOW (ref >60)
GFR calc non Af Amer: 50 mL/min — ABNORMAL LOW (ref >60)
Glucose, Bld: 74 mg/dL (ref 70–99)
Potassium: 4.3 mmol/L (ref 3.5–5.1)
Sodium: 138 mmol/L (ref 135–145)

## 2019-12-27 LAB — FOLATE: Folate: 39.5 ng/mL (ref >5.9)

## 2019-12-27 LAB — VALPROIC ACID LEVEL: Valproic Acid Lvl: 48 ug/mL — ABNORMAL LOW (ref 50.0–100.0)

## 2019-12-27 LAB — PHOSPHORUS: Phosphorus: 4.1 mg/dL (ref 2.5–4.6)

## 2019-12-27 LAB — AMMONIA: Ammonia: 76 umol/L — ABNORMAL HIGH (ref 9–35)

## 2019-12-27 LAB — MAGNESIUM: Magnesium: 1.3 mg/dL — ABNORMAL LOW (ref 1.7–2.4)

## 2019-12-27 MED ORDER — TIZANIDINE HCL 4 MG PO TABS
2.0000 mg | ORAL_TABLET | Freq: Three times a day (TID) | ORAL | Status: DC
  Administered 2019-12-27 – 2019-12-28 (×3): 2 mg via ORAL
  Filled 2019-12-27 (×3): qty 1

## 2019-12-27 MED ORDER — PREDNISONE 5 MG PO TABS
10.0000 mg | ORAL_TABLET | Freq: Every day | ORAL | Status: DC
  Administered 2019-12-28 – 2019-12-29 (×2): 10 mg via ORAL
  Filled 2019-12-27 (×2): qty 2

## 2019-12-27 MED ORDER — FUROSEMIDE 40 MG PO TABS: 40.0000 mg | ORAL_TABLET | Freq: Every day | ORAL | Status: DC

## 2019-12-27 MED ORDER — SODIUM CHLORIDE 0.9 % IV SOLN
1.0000 g | INTRAVENOUS | Status: AC
  Administered 2019-12-28 – 2019-12-29 (×2): 1 g via INTRAVENOUS
  Filled 2019-12-27 (×2): qty 10

## 2019-12-27 MED ORDER — MAGNESIUM SULFATE 4 GM/100ML IV SOLN
4.0000 g | Freq: Once | INTRAVENOUS | Status: AC
  Administered 2019-12-27: 4 g via INTRAVENOUS
  Filled 2019-12-27: qty 100

## 2019-12-27 MED ORDER — CALCIUM CARBONATE-VITAMIN D 500-200 MG-UNIT PO TABS
2.0000 | ORAL_TABLET | Freq: Every day | ORAL | Status: DC
  Administered 2019-12-28 – 2019-12-30 (×3): 2 via ORAL
  Filled 2019-12-27 (×3): qty 2

## 2019-12-27 MED ORDER — LACTULOSE 10 GM/15ML PO SOLN
20.0000 g | Freq: Three times a day (TID) | ORAL | Status: DC
  Administered 2019-12-27 – 2019-12-30 (×11): 20 g via ORAL
  Filled 2019-12-27 (×11): qty 30

## 2019-12-27 MED ORDER — OXYCODONE HCL 5 MG PO TABS
20.0000 mg | ORAL_TABLET | Freq: Three times a day (TID) | ORAL | Status: DC | PRN
  Administered 2019-12-27 – 2019-12-29 (×4): 20 mg via ORAL
  Filled 2019-12-27 (×4): qty 4

## 2019-12-27 NOTE — Progress Notes (Signed)
PROGRESS NOTE  Amber [name] PPI:[id number] DOB: 03/04/1956   PCP: [name], MD  Patient is from: Home.  DOA: 12/24/2019 LOS: 2  Brief Narrative / Interim history: 64 year old female with history of embolic stroke, upper extremity DVT's on Eliquis, PFO, HTN, DM-2, pancreatic insufficiency, chronic constipation and duodenitis presenting with altered mental status, forgetfulness, word finding difficulty and intermittent left hand numbness, unsteadiness and generalized weakness.  In ED, CT head with evidence of subacute CVA.  Neurology consulted and admitted.  MRI revealed new posterior right MCA infarct.  MRA without LVO or significant stenosis.  Carotid Doppler with 40 to 59% left ICA stenosis and 1 to 39% right ICA stenosis.  Transcranial Doppler poor quality.  TTE with normal EF without evidence of thrombus.  Upper and lower extremity Dopplers negative for DVT.  Subjective: No major events overnight or this morning.  Continues to complain of pain all over but more in her back.  There has been going on for weeks.  She has chronic pain and instrumentations embedded.  She also states that she would rather go home with home therapy going to rehab/SNF.  Objective: Vitals:   12/27/19 0400 12/27/19 0500 12/27/19 0600 12/27/19 0759  BP:    105/88  Pulse:    91  Resp: (!) 7 15 19 16   Temp:    (!) 97.5 F (36.4 C)  TempSrc:    Axillary  SpO2:    100%  Weight:      Height:        Intake/Output Summary (Last 24 hours) at 12/27/2019 1159 Last data filed at 12/27/2019 02/24/2020 Gross per 24 hour  Intake 200 ml  Output 401 ml  Net -201 ml   Filed Weights   12/24/19 1823  Weight: 47.6 kg    Examination:  GENERAL: Frail elderly female.  Moaning due to pain.  HEENT: MMM.  Vision and hearing grossly intact.  NECK: Supple.  No apparent JVD.  RESP:  No IWOB. Good air movement bilaterally. CVS:  RRR. Heart sounds normal.  ABD/GI/GU: Bowel sounds present. Soft.  Diffuse tenderness. MSK/EXT: Moves  extremities but limited by pain.  Chronic deformities and muscle mass wasting. SKIN: no apparent skin lesion or wound NEURO: Awake and oriented x4 except date.  No apparent focal neuro deficit. PSYCH: Moaning due to pain.   Procedures:  None  Assessment & Plan: Acute right MCA embolic CVA: presented with constellation of symptoms including word finding difficulty, left hand numbness and unsteadiness.  MRI brain with acute posterior right MCA CVA.  MRA, carotid Doppler, upper and lower extremity Doppler and echocardiogram without significant finding.  History of upper extremity DVT and known PFO.  LDL 46.  A1c 4.4. -Continue statin and Eliquis-has history of PFO and DVT.  -PT/OT/SLP-recommended SNF but patient prefers to go home  Acute urinary tract infection: Has UTI symptoms.  Urine culture multiple species -Continue ceftriaxone 2/4> -Follow repeat urine culture  History of upper extremity DVT: Upper and lower extremity Dopplers negative for DVT this admission. -Continue Eliquis  Chronic diastolic CHF: Echo with EF of 60 to 65% and G1 DD but no other significant structural functional abnormalities.  Appears euvolemic. -Continue home Lasix -Monitor fluid status  Essential hypertension: Normotensive -Continue home metoprolol  Pancreatic insufficiency -Continue home Creon  Controlled DM-2: A1c 4.4%.  Not on medication at home.  History of rheumatoid arthritis on chronic steroid -Increase home prednisone to 10 mg daily -Add calcium and vitamin D  Chronic pain syndrome: Only very  high-dose oxycodone and Zanaflex at home. -Scheduled Tylenol 1000 mg every 8 hours -Continue home oxycodone and Zanaflex at reduced dose  Abdominal pain/tenderness: KUB not impressive.  Having regular bowel movements. -Bowel regimen  Debility/physical deconditioning -PT/OT-recommended SNF-patient prefers to go home             DVT prophylaxis: On Eliquis Code Status: Full code Family  Communication: Updated patient's daughter at bedside 2/5.  Discharge barrier: Pending urine culture Patient is from: Home Final disposition: Likely home with [facility] when medically stable  Consultants: Neurology   Microbiology summarized: COVID-19 and influenza PCR negative. Urine culture with multiple species. Repeat urine culture pending.  Sch Meds:  Scheduled Meds: . acetaminophen  1,000 mg Oral Q8H  . allopurinol  300 mg Oral Daily  . apixaban  2.5 mg Oral BID  . atorvastatin  10 mg Oral q1800  . colestipol  1 g Oral BID  . feeding supplement (ENSURE ENLIVE)  237 mL Oral BID BM  . furosemide  40 mg Oral BID  . lipase/protease/amylase  36,000 Units Oral TID AC  . metoprolol succinate  25 mg Oral Daily  . nystatin  5 mL Oral QID  . pantoprazole  40 mg Oral BID  . polyethylene glycol  17 g Oral Daily  . predniSONE  5 mg Oral Q breakfast  . valproic acid  500 mg Oral TID   Continuous Infusions: . cefTRIAXone (ROCEPHIN)  IV 1 g (12/27/19 1045)   PRN Meds:.albuterol, ondansetron (ZOFRAN) IV, oxyCODONE, senna-docusate  Antimicrobials: Anti-infectives (From admission, onward)   Start     Dose/Rate Route Frequency Ordered Stop   12/25/19 1045  cefTRIAXone (ROCEPHIN) 1 g in sodium chloride 0.9 % 100 mL IVPB     1 g 200 mL/hr over 30 Minutes Intravenous Every 24 hours 12/25/19 1034         I have personally reviewed the following labs and images: CBC: Recent Labs  Lab 12/24/19 1845 12/25/19 0359  WBC 13.1* 11.1*  NEUTROABS 5.6  --   HGB 9.5* 8.3*  HCT 28.5* 24.3*  MCV 79.6* 78.1*  PLT 185 163   BMP &GFR Recent Labs  Lab 12/24/19 1845 12/25/19 0359  NA 136 141  K 3.7 3.8  CL 110 114*  CO2 20* 20*  GLUCOSE 99 87  BUN 11 9  CREATININE 0.83 0.78  CALCIUM 8.1* 7.9*   Estimated Creatinine Clearance: 53 mL/min (by C-G formula based on SCr of 0.78 mg/dL). Liver & Pancreas: Recent Labs  Lab 12/24/19 1845  AST 27  ALT 20  ALKPHOS 102  BILITOT 0.5  PROT 6.0*   ALBUMIN 2.3*   No results for input(s): LIPASE, AMYLASE in the last 168 hours. No results for input(s): AMMONIA in the last 168 hours. Diabetic: Recent Labs    12/25/19 0359  HGBA1C 4.4*   No results for input(s): GLUCAP in the last 168 hours. Cardiac Enzymes: No results for input(s): CKTOTAL, CKMB, CKMBINDEX, TROPONINI in the last 168 hours. No results for input(s): PROBNP in the last 8760 hours. Coagulation Profile: No results for input(s): INR, PROTIME in the last 168 hours. Thyroid Function Tests: No results for input(s): TSH, T4TOTAL, FREET4, T3FREE, THYROIDAB in the last 72 hours. Lipid Profile: Recent Labs    12/25/19 0359  CHOL 120  HDL 52  LDLCALC 46  TRIG 111  CHOLHDL 2.3   Anemia Panel: No results for input(s): VITAMINB12, FOLATE, FERRITIN, TIBC, IRON, RETICCTPCT in the last 72 hours. Urine analysis:  Component Value Date/Time   COLORURINE YELLOW 12/25/2019 0510   APPEARANCEUR HAZY (A) 12/25/2019 0510   LABSPEC 1.015 12/25/2019 0510   PHURINE 6.0 12/25/2019 0510   GLUCOSEU NEGATIVE 12/25/2019 0510   HGBUR NEGATIVE 12/25/2019 0510   BILIRUBINUR NEGATIVE 12/25/2019 0510   KETONESUR NEGATIVE 12/25/2019 0510   PROTEINUR NEGATIVE 12/25/2019 0510   UROBILINOGEN 1.0 07/27/2015 1052   NITRITE NEGATIVE 12/25/2019 0510   LEUKOCYTESUR MODERATE (A) 12/25/2019 0510   Sepsis Labs: Invalid input(s): PROCALCITONIN, LACTICIDVEN  Microbiology: Recent Results (from the past 240 hour(s))  Respiratory Panel by RT PCR (Flu A&B, Covid) - Nasopharyngeal Swab     Status: None   Collection Time: 12/24/19  7:00 PM   Specimen: Nasopharyngeal Swab  Result Value Ref Range Status   SARS Coronavirus 2 by RT PCR NEGATIVE NEGATIVE Final    Comment: (NOTE) SARS-CoV-2 target nucleic acids are NOT DETECTED. The SARS-CoV-2 RNA is generally detectable in upper respiratoy specimens during the acute phase of infection. The lowest concentration of SARS-CoV-2 viral copies this assay  can detect is 131 copies/mL. A negative result does not preclude SARS-Cov-2 infection and should not be used as the sole basis for treatment or other patient management decisions. A negative result may occur with  improper specimen collection/handling, submission of specimen other than nasopharyngeal swab, presence of viral mutation(s) within the areas targeted by this assay, and inadequate number of viral copies (<131 copies/mL). A negative result must be combined with clinical observations, patient history, and epidemiological information. The expected result is Negative. Fact Sheet for Patients:  https://www.moore.com/ Fact Sheet for Healthcare Providers:  https://www.young.biz/ This test is not yet ap proved or cleared by the [place] FDA and  has been authorized for detection and/or diagnosis of SARS-CoV-2 by FDA under an Emergency Use Authorization (EUA). This EUA will remain  in effect (meaning this test can be used) for the duration of the COVID-19 declaration under Section 564(b)(1) of the Act, 21 U.S.C. section 360bbb-3(b)(1), unless the authorization is terminated or revoked sooner.    Influenza A by PCR NEGATIVE NEGATIVE Final   Influenza B by PCR NEGATIVE NEGATIVE Final    Comment: (NOTE) The Xpert Xpress SARS-CoV-2/FLU/RSV assay is intended as an aid in  the diagnosis of influenza from Nasopharyngeal swab specimens and  should not be used as a sole basis for treatment. Nasal washings and  aspirates are unacceptable for Xpert Xpress SARS-CoV-2/FLU/RSV  testing. Fact Sheet for Patients: https://www.moore.com/ Fact Sheet for Healthcare Providers: https://www.young.biz/ This test is not yet approved or cleared by the [place] FDA and  has been authorized for detection and/or diagnosis of SARS-CoV-2 by  FDA under an Emergency Use Authorization (EUA). This EUA will remain  in effect  (meaning this test can be used) for the duration of the  Covid-19 declaration under Section 564(b)(1) of the Act, 21  U.S.C. section 360bbb-3(b)(1), unless the authorization is  terminated or revoked. Performed at [facility], [address], [place], [place] [address]   Culture, Urine     Status: Abnormal   Collection Time: 12/25/19  5:10 AM   Specimen: Urine, Random  Result Value Ref Range Status   Specimen Description URINE, RANDOM  Final   Special Requests   Final    culture prior urine sample Performed at [facility], [address] [place], [place] [address]    Culture MULTIPLE SPECIES PRESENT, SUGGEST RECOLLECTION (A)  Final   Report Status 12/26/2019 FINAL  Final  Radiology Studies: DG Abd Portable 1V  Result Date: 12/26/2019 CLINICAL DATA:  Abdominal pain. EXAM: PORTABLE ABDOMEN - 1 VIEW COMPARISON:  01/01/2019 FINDINGS: Nonobstructive bowel gas pattern. No supine evidence of pneumoperitoneum. No pneumatosis or portal venous gas. Post cholecystectomy. Limited visualization of the lower thorax is negative for focal airspace opacity. Atherosclerotic plaque within the abdominal aorta and pelvic vasculature. Post mid/lower thoracic paraspinal fusion, incompletely evaluated. No definite acute osseous abnormalities. IMPRESSION: Nonobstructive bowel gas pattern. Electronically Signed   By: Sandi Mariscal M.D.   On: 12/26/2019 14:41      Chamika Cunanan T. [place]  If 7PM-7AM, please contact night-coverage [email or web address] Password [email or web address] 12/27/2019, 11:59 AM

## 2019-12-27 NOTE — Care Plan (Signed)
Morning lab resulted as significant for creatinine 1.17, magnesium 1.3, Hgb 8.1 (slightly down), platelet 140.  Ammonia elevated to 76.  Repeat urine culture with multiple species again.  Assessment and plan AKI: Likely prerenal due to Lasix. -Discontinue Lasix.  Per daughter, takes as needed at home. -Recheck renal function in the morning  Hypomagnesemia -IV magnesium sulfate 4 g and recheck in the morning  Elevated ammonia level: 76.  Likely due to dehydration and valproic acid.  Could be contributing to her mental status. -Start lactulose 20 g 3 times daily  History of seizure: Per daughter, had 2 seizure activities in 2020.  Did not tolerate Keppra and Vimpat in the past. -Check Depakote level and curbside neurology.  UTI: Repeat urine culture with multiple species again -Will continue ceftriaxone for total of 5 days  Normocytic anemia: Hgb 8.3 (admit)> 8.1 -Check anemia panel  Goal of care: Per chart review, patient seems to have been enrolled with our [facility] care.  Family has no recollection of this.  However, daughter appreciates palliative care input. -Palliative care consulted

## 2019-12-28 DIAGNOSIS — Z7189 Other specified counseling: Secondary | ICD-10-CM

## 2019-12-28 DIAGNOSIS — D649 Anemia, unspecified: Secondary | ICD-10-CM

## 2019-12-28 DIAGNOSIS — N179 Acute kidney failure, unspecified: Secondary | ICD-10-CM

## 2019-12-28 DIAGNOSIS — K7200 Acute and subacute hepatic failure without coma: Secondary | ICD-10-CM

## 2019-12-28 LAB — MAGNESIUM: Magnesium: 2.9 mg/dL — ABNORMAL HIGH (ref 1.7–2.4)

## 2019-12-28 LAB — ANTIPHOSPHOLIPID SYNDROME EVAL, BLD
Anticardiolipin IgA: <9 APL U/mL (ref 0–11)
Anticardiolipin IgG: <9 GPL U/mL (ref 0–14)
Anticardiolipin IgM: 10 MPL U/mL (ref 0–12)
DRVVT: 56.7 s — ABNORMAL HIGH (ref 0.0–47.0)
PTT Lupus Anticoagulant: 39.4 s (ref 0.0–51.9)
Phosphatydalserine, IgA: 2 APS IgA (ref 0–20)
Phosphatydalserine, IgG: 2 GPS IgG (ref 0–11)
Phosphatydalserine, IgM: 17 MPS IgM (ref 0–25)

## 2019-12-28 LAB — BASIC METABOLIC PANEL
Anion gap: 8 (ref 5–15)
BUN: 12 mg/dL (ref 8–23)
CO2: 21 mmol/L — ABNORMAL LOW (ref 22–32)
Calcium: 8 mg/dL — ABNORMAL LOW (ref 8.9–10.3)
Chloride: 112 mmol/L — ABNORMAL HIGH (ref 98–111)
Creatinine, Ser: 1.47 mg/dL — ABNORMAL HIGH (ref 0.44–1.00)
GFR calc Af Amer: 44 mL/min — ABNORMAL LOW (ref >60)
GFR calc non Af Amer: 38 mL/min — ABNORMAL LOW (ref >60)
Glucose, Bld: 86 mg/dL (ref 70–99)
Potassium: 4.1 mmol/L (ref 3.5–5.1)
Sodium: 141 mmol/L (ref 135–145)

## 2019-12-28 LAB — PHOSPHORUS: Phosphorus: 4.6 mg/dL (ref 2.5–4.6)

## 2019-12-28 LAB — HEPATIC FUNCTION PANEL
ALT: 12 U/L (ref 0–44)
AST: 16 U/L (ref 15–41)
Albumin: 1.5 g/dL — ABNORMAL LOW (ref 3.5–5.0)
Alkaline Phosphatase: 74 U/L (ref 38–126)
Bilirubin, Direct: 0.1 mg/dL (ref 0.0–0.2)
Indirect Bilirubin: 0.2 mg/dL — ABNORMAL LOW (ref 0.3–0.9)
Total Bilirubin: 0.3 mg/dL (ref 0.3–1.2)
Total Protein: 4.2 g/dL — ABNORMAL LOW (ref 6.5–8.1)

## 2019-12-28 LAB — CBC
HCT: 22.3 % — ABNORMAL LOW (ref 36.0–46.0)
Hemoglobin: 7.5 g/dL — ABNORMAL LOW (ref 12.0–15.0)
MCH: 26.6 pg (ref 26.0–34.0)
MCHC: 33.6 g/dL (ref 30.0–36.0)
MCV: 79.1 fL — ABNORMAL LOW (ref 80.0–100.0)
Platelets: 125 10*3/uL — ABNORMAL LOW (ref 150–400)
RBC: 2.82 MIL/uL — ABNORMAL LOW (ref 3.87–5.11)
RDW: 15.9 % — ABNORMAL HIGH (ref 11.5–15.5)
WBC: 4.4 10*3/uL (ref 4.0–10.5)
nRBC: 0 % (ref 0.0–0.2)

## 2019-12-28 LAB — AMMONIA: Ammonia: 33 umol/L (ref 9–35)

## 2019-12-28 LAB — DRVVT MIX: dRVVT Mix: 43.4 s — ABNORMAL HIGH (ref 0.0–40.4)

## 2019-12-28 LAB — DRVVT CONFIRM: dRVVT Confirm: 0.9 ratio (ref 0.8–1.2)

## 2019-12-28 MED ORDER — SODIUM CHLORIDE 0.9 % IV SOLN: INTRAVENOUS | Status: DC

## 2019-12-28 MED ORDER — SODIUM CHLORIDE 0.9 % IV SOLN
INTRAVENOUS | Status: DC
  Filled 2019-12-28 (×2): qty 1000

## 2019-12-28 NOTE — Progress Notes (Signed)
PROGRESS NOTE  Amber [name] VOZ:[id number] DOB: September 21, 1956   PCP: [name], MD  Patient is from: Home.  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  In ED, CT head with evidence of subacute CVA.  Neurology consulted and admitted.  MRI revealed new posterior right MCA infarct.  MRA without LVO or significant stenosis.  Carotid Doppler with 40 to 59% left ICA stenosis and 1 to 39% right ICA stenosis.  Transcranial Doppler poor quality.  TTE with normal EF without evidence of thrombus.  Upper and lower extremity Dopplers negative for DVT.  Subjective: No major events overnight or this morning.  No major complaints other than wanting to eat and go home.  She has some dysarthria today. She says this happens quite often.  She is oriented to self, place and date only.   Objective: Vitals:   12/27/19 2013 12/27/19 2307 12/28/19 0316 12/28/19 0750  BP: 100/79 102/89 131/87 98/85  Pulse: 78 79 85 78  Resp: 17 17 18 18   Temp: 98.1 F (36.7 C) 98.9 F (37.2 C) 98.9 F (37.2 C) 97.8 F (36.6 C)  TempSrc:    Oral  SpO2: 100% 100% 100% 99%  Weight:      Height:        Intake/Output Summary (Last 24 hours) at 12/28/2019 1114 Last data filed at 12/28/2019 0950 Gross per 24 hour  Intake 380 ml  Output 200 ml  Net 180 ml   Filed Weights   12/24/19 1823  Weight: 47.6 kg    Examination:  GENERAL: Frail elderly female.  No apparent distress. HEENT: MMM.  Vision and hearing grossly intact.  NECK: Supple.  No apparent JVD.  RESP:  No IWOB.  Fair aeration bilaterally. CVS:  RRR. Heart sounds normal.  ABD/GI/GU: Bowel sounds present. Soft.  Diffuse  tenderness? MSK/EXT:  Moves extremities. No apparent deformity. No edema.  SKIN: no apparent skin lesion or wound NEURO: Awake, alert and oriented self, place and day only.  Some dysarthria and bilateral LE weakness. PSYCH: Calm. Normal affect.   Procedures:  None  Assessment & Plan: Acute right MCA embolic CVA: presented with constellation of symptoms including word finding difficulty, left hand numbness and unsteadiness.  MRI brain with acute posterior right MCA CVA.  MRA, carotid Doppler, upper and lower extremity Doppler and echocardiogram without significant finding.  History of upper extremity DVT and known PFO.  LDL 46.  A1c 4.4. -Appreciate guidance by neurology -Continue home Eliquis-has history of PFO and DVT.  PFO closure was not entertained due to noncompliance. -Continue statin -PT/OT/SLP-recommended SNF but patient prefers to go home..  Acute urinary tract infection: Has UTI symptoms.  Urine culture multiple species again -Continue ceftriaxone for a total of 5 days.  Acute metabolic encephalopathy: Likely a combination of hepatic encephalopathy/hyperammonia and sedating medications.  Hyperammonia likely due to Depakote.  Depakote level slightly low.  Unfortunately not able to do much with her pain medication -Continue lactulose -Reorientation and delirium precautions.  Chronic diastolic CHF: Echo with EF of 60 to 65% and G1 DD but no other significant structural functional abnormalities.  Appears euvolemic.  Per daughter, takes Lasix as needed at home.  Has been getting twice daily.  Now with AKI. -Lasix discontinued -Gentle IV fluid  for AKI. -Monitor fluid status  AKI: Likely due to diuretics.  Has been getting p.o. Lasix 40 mg twice daily here.  She only takes as needed at home. -Lasix discontinued -IV fluid  Essential hypertension: Normotensive -Continue home metoprolol -Discontinue Lasix.  History of rheumatoid arthritis on chronic steroid -Increase home  prednisone to 10 mg daily.   -Continue scheduled Tylenol, as needed oxygen and Zanaflex at reduced dose  History of upper extremity DVT: Upper and lower extremity Dopplers negative for DVT this admission. -Continue Eliquis  Pancreatic insufficiency -Continue home Creon  Normocytic anemia: Baseline Hgb 8-9 >9.5 (admit)> 8.1> 7.5.  Anemia panel consistent with anemia of chronic disease.  -Check Hemoccult. -Continue monitoring  Controlled DM-2: A1c 4.4%.  Not on medication at home.  Chronic pain: Reports pain in her legs -Home medications as above.  Abdominal pain/tenderness: KUB did not reveal obstruction or constipation.  Having regular bowel movements. -Lactulose as above  Debility/physical deconditioning -PT/OT-recommended SNF-patient prefers to go home.  Goal of care: Per chart review, patient seems to have been improved with out oral care in the past.  Family has no recollection of this.  However, daughter appreciate palliative care consult. -Palliative care consulted.           DVT prophylaxis: On Eliquis Code Status: Full code Family Communication: Updated patient's daughter over the phone  Discharge barrier: AKI Patient is from: Home Final disposition: Likely home when AKI resolves..  Does not want to go to SNF  Consultants: Neurology   Microbiology summarized: COVID-19 and influenza PCR negative. Urine culture with multiple species. Repeat urine culture pending.  Sch Meds:  Scheduled Meds: . acetaminophen  1,000 mg Oral Q8H  . allopurinol  300 mg Oral Daily  . apixaban  2.5 mg Oral BID  . atorvastatin  10 mg Oral q1800  . calcium-vitamin D  2 tablet Oral Q breakfast  . colestipol  1 g Oral BID  . feeding supplement (ENSURE ENLIVE)  237 mL Oral BID BM  . lactulose  20 g Oral TID  . lipase/protease/amylase  36,000 Units Oral TID AC  . metoprolol succinate  25 mg Oral Daily  . nystatin  5 mL Oral QID  . pantoprazole  40 mg Oral BID  . predniSONE  10  mg Oral Q breakfast  . tiZANidine  2 mg Oral TID  . valproic acid  500 mg Oral TID   Continuous Infusions: . cefTRIAXone (ROCEPHIN)  IV 1 g (12/28/19 1039)  . 0.9 % sodium chloride with kcl 50 mL/hr at 12/28/19 1101   PRN Meds:.albuterol, ondansetron (ZOFRAN) IV, oxyCODONE  Antimicrobials: Anti-infectives (From admission, onward)   Start     Dose/Rate Route Frequency Ordered Stop   12/28/19 1000  cefTRIAXone (ROCEPHIN) 1 g in sodium chloride 0.9 % 100 mL IVPB     1 g 200 mL/hr over 30 Minutes Intravenous Every 24 hours 12/27/19 1449 12/30/19 0959   12/25/19 1045  cefTRIAXone (ROCEPHIN) 1 g in sodium chloride 0.9 % 100 mL IVPB  Status:  Discontinued     1 g 200 mL/hr over 30 Minutes Intravenous Every 24 hours 12/25/19 1034 12/27/19 1449       I have personally reviewed the following labs and images: CBC: Recent Labs  Lab 12/24/19 1845 12/25/19 0359 12/27/19 1105 12/28/19 0508  WBC 13.1* 11.1* 5.7 4.4  NEUTROABS 5.6  --   --   --   HGB 9.5* 8.3* 8.1* 7.5*  HCT 28.5* 24.3* 23.5* 22.3*  MCV 79.6* 78.1* 78.1* 79.1*  PLT 185 163 140* 125*   BMP &GFR Recent Labs  Lab 12/24/19 1845 12/25/19 0359 12/27/19 1105 12/28/19 0508  NA 136 141 138 141  K 3.7 3.8 4.3 4.1  CL 110 114* 109 112*  CO2 20* 20* 16* 21*  GLUCOSE 99 87 74 86  BUN 11 9 13 12   CREATININE 0.83 0.78 1.17* 1.47*  CALCIUM 8.1* 7.9* 7.7* 8.0*  MG  --   --  1.3* 2.9*  PHOS  --   --  4.1 4.6   Estimated Creatinine Clearance: 28.9 mL/min (A) (by C-G formula based on SCr of 1.47 mg/dL (H)). Liver & Pancreas: Recent Labs  Lab 12/24/19 1845 12/28/19 0508  AST 27 16  ALT 20 12  ALKPHOS 102 74  BILITOT 0.5 0.3  PROT 6.0* 4.2*  ALBUMIN 2.3* 1.5*   No results for input(s): LIPASE, AMYLASE in the last 168 hours. Recent Labs  Lab 12/27/19 1105 12/28/19 0508  AMMONIA 76* 33   Diabetic: No results for input(s): HGBA1C in the last 72 hours. No results for input(s): GLUCAP in the last 168 hours. Cardiac  Enzymes: No results for input(s): CKTOTAL, CKMB, CKMBINDEX, TROPONINI in the last 168 hours. No results for input(s): PROBNP in the last 8760 hours. Coagulation Profile: No results for input(s): INR, PROTIME in the last 168 hours. Thyroid Function Tests: No results for input(s): TSH, T4TOTAL, FREET4, T3FREE, THYROIDAB in the last 72 hours. Lipid Profile: No results for input(s): CHOL, HDL, LDLCALC, TRIG, CHOLHDL, LDLDIRECT in the last 72 hours. Anemia Panel: Recent Labs    12/27/19 1543  VITAMINB12 883  FOLATE 39.5  FERRITIN 606*  TIBC NOT CALCULATED  IRON 47  RETICCTPCT 1.8   Urine analysis:    Component Value Date/Time   COLORURINE YELLOW 12/25/2019 0510   APPEARANCEUR HAZY (A) 12/25/2019 0510   LABSPEC 1.015 12/25/2019 0510   PHURINE 6.0 12/25/2019 0510   GLUCOSEU NEGATIVE 12/25/2019 0510   HGBUR NEGATIVE 12/25/2019 0510   BILIRUBINUR NEGATIVE 12/25/2019 0510   KETONESUR NEGATIVE 12/25/2019 0510   PROTEINUR NEGATIVE 12/25/2019 0510   UROBILINOGEN 1.0 07/27/2015 1052   NITRITE NEGATIVE 12/25/2019 0510   LEUKOCYTESUR MODERATE (A) 12/25/2019 0510   Sepsis Labs: Invalid input(s): PROCALCITONIN, [place]  Microbiology: Recent Results (from the past 240 hour(s))  Respiratory Panel by RT PCR (Flu A&B, Covid) - Nasopharyngeal Swab     Status: None   Collection Time: 12/24/19  7:00 PM   Specimen: Nasopharyngeal Swab  Result Value Ref Range Status   SARS Coronavirus 2 by RT PCR NEGATIVE NEGATIVE Final    Comment: (NOTE) SARS-CoV-2 target nucleic acids are NOT DETECTED. The SARS-CoV-2 RNA is generally detectable in upper respiratoy specimens during the acute phase of infection. The lowest concentration of SARS-CoV-2 viral copies this assay can detect is 131 copies/mL. A negative result does not preclude SARS-Cov-2 infection and should not be used as the sole basis for treatment or other patient management decisions. A negative result may occur with  improper specimen  collection/handling, submission of specimen other than nasopharyngeal swab, presence of viral mutation(s) within the areas targeted by this assay, and inadequate number of viral copies (<131 copies/mL). A negative result must be combined with clinical observations, patient history, and epidemiological information. The expected result is Negative. Fact Sheet for Patients:  PinkCheek.be Fact Sheet for Healthcare Providers:  GravelBags.it This test is not yet ap proved or cleared by the [place] and  has been authorized  for detection and/or diagnosis of SARS-CoV-2 by FDA under an Emergency Use Authorization (EUA). This EUA will remain  in effect (meaning this test can be used) for the duration of the COVID-19 declaration under Section 564(b)(1) of the Act, 21 U.S.C. section 360bbb-3(b)(1), unless the authorization is terminated or revoked sooner.    Influenza A by PCR NEGATIVE NEGATIVE Final   Influenza B by PCR NEGATIVE NEGATIVE Final    Comment: (NOTE) The Xpert Xpress SARS-CoV-2/FLU/RSV assay is intended as an aid in  the diagnosis of influenza from Nasopharyngeal swab specimens and  should not be used as a sole basis for treatment. Nasal washings and  aspirates are unacceptable for Xpert Xpress SARS-CoV-2/FLU/RSV  testing. Fact Sheet for Patients: https://www.moore.com/ Fact Sheet for Healthcare Providers: https://www.young.biz/ This test is not yet approved or cleared by the [place] FDA and  has been authorized for detection and/or diagnosis of SARS-CoV-2 by  FDA under an Emergency Use Authorization (EUA). This EUA will remain  in effect (meaning this test can be used) for the duration of the  Covid-19 declaration under Section 564(b)(1) of the Act, 21  U.S.C. section 360bbb-3(b)(1), unless the authorization is  terminated or revoked. Performed at [facility]  Lab, [address] [place], [place] [address]   Culture, Urine     Status: Abnormal   Collection Time: 12/25/19  5:10 AM   Specimen: Urine, Random  Result Value Ref Range Status   Specimen Description URINE, RANDOM  Final   Special Requests   Final    culture prior urine sample Performed at [facility], [address], [place], [place] [address]    Culture MULTIPLE SPECIES PRESENT, SUGGEST RECOLLECTION (A)  Final   Report Status 12/26/2019 FINAL  Final  Culture, Urine     Status: Abnormal   Collection Time: 12/26/19  1:07 PM   Specimen: Urine, Catheterized  Result Value Ref Range Status   Specimen Description URINE, CATHETERIZED  Final   Special Requests   Final    NONE Performed at [facility], [address] [place], [place] [address]    Culture MULTIPLE SPECIES PRESENT, SUGGEST RECOLLECTION (A)  Final   Report Status 12/27/2019 FINAL  Final    Radiology Studies: No results found.    [place] Weekly T. Dariela Stoker [facility]  If 7PM-7AM, please contact night-coverage [email or web address] Password [email or web address] 12/28/2019, 11:14 AM

## 2019-12-29 DIAGNOSIS — Z515 Encounter for palliative care: Secondary | ICD-10-CM

## 2019-12-29 LAB — BASIC METABOLIC PANEL
Anion gap: 14 (ref 5–15)
BUN: 13 mg/dL (ref 8–23)
CO2: 16 mmol/L — ABNORMAL LOW (ref 22–32)
Calcium: 7.9 mg/dL — ABNORMAL LOW (ref 8.9–10.3)
Chloride: 112 mmol/L — ABNORMAL HIGH (ref 98–111)
Creatinine, Ser: 1.43 mg/dL — ABNORMAL HIGH (ref 0.44–1.00)
GFR calc Af Amer: 46 mL/min — ABNORMAL LOW (ref >60)
GFR calc non Af Amer: 39 mL/min — ABNORMAL LOW (ref >60)
Glucose, Bld: 158 mg/dL — ABNORMAL HIGH (ref 70–99)
Potassium: 5 mmol/L (ref 3.5–5.1)
Sodium: 142 mmol/L (ref 135–145)

## 2019-12-29 LAB — CBC
HCT: 24.3 % — ABNORMAL LOW (ref 36.0–46.0)
Hemoglobin: 8.1 g/dL — ABNORMAL LOW (ref 12.0–15.0)
MCH: 26.4 pg (ref 26.0–34.0)
MCHC: 33.3 g/dL (ref 30.0–36.0)
MCV: 79.2 fL — ABNORMAL LOW (ref 80.0–100.0)
Platelets: 116 10*3/uL — ABNORMAL LOW (ref 150–400)
RBC: 3.07 MIL/uL — ABNORMAL LOW (ref 3.87–5.11)
RDW: 16.6 % — ABNORMAL HIGH (ref 11.5–15.5)
WBC: 4.1 10*3/uL (ref 4.0–10.5)
nRBC: 0.7 % — ABNORMAL HIGH (ref 0.0–0.2)

## 2019-12-29 LAB — MAGNESIUM: Magnesium: 2.6 mg/dL — ABNORMAL HIGH (ref 1.7–2.4)

## 2019-12-29 LAB — PHOSPHORUS: Phosphorus: 5 mg/dL — ABNORMAL HIGH (ref 2.5–4.6)

## 2019-12-29 MED ORDER — OXYCODONE HCL 5 MG PO TABS
10.0000 mg | ORAL_TABLET | Freq: Three times a day (TID) | ORAL | Status: DC | PRN
  Administered 2019-12-30 (×2): 10 mg via ORAL
  Filled 2019-12-29 (×2): qty 2

## 2019-12-29 MED ORDER — PREDNISONE 5 MG PO TABS
5.0000 mg | ORAL_TABLET | Freq: Every day | ORAL | Status: DC
  Administered 2019-12-30: 5 mg via ORAL
  Filled 2019-12-29: qty 1

## 2019-12-29 NOTE — Consult Note (Signed)
Consultation Note Date: 12/29/2019   Patient Name: Amber [name]  DOB: 06-27-1956  MRN: [id number]  Age / Sex: 64 y.o., female  PCP: Sandi Mariscal, MD Referring Physician: Mercy Riding, MD  Reason for Consultation: Establishing goals of care  HPI/Patient Profile: 64 y.o. female  with past medical history of embolic stroke, PFO and DVT on Eliquis, seizures, asthma, hypertension, diabetes, chronic constipation, duodenitis, rheumatoid arthritis, pancreatic insufficiency, falls admitted on 12/24/2019 from home with family reporting worsening confusion, expressive aphasia, sore all over, drooling, light sensitivity x 2 days. MRI brain shows moderate sized acute to subacute stroke posterior right MCA infarct and UTI. Noted previous plan for outpatient palliative care but it appears that per records they were not able to contact patient/family and never connected.   Clinical Assessment and Goals of Care: I have reviewed records and met today with Amber [name]. She is more alert today that it appears she has been per previous notes. She tracks and follows commands. She is able to move all 4 extremities although struggles to lift arms and she does follow all commands and answers questions with head nod appropriately. She does appear to understand everything I am saying but is struggling to express her responses. She is trying to communicate but unable to understand most of what she is saying. She is also struggling to find the right words.   I called and spoke with daughter, Amber [name]. I explained the above. From her description it sounds like Amber [name] was much less responsive the past few days. Amber [name] tells me that her mother lives with her granddaughter and son and daughter live very close. She reports that her mother was able to converse, ambulate with walker, and had a good appetite but often would need assistance with feeding.  She understands that her mother will need some rehab and that her situation is complicated of the effect from infection, medication, or stroke.   They are hopeful for more improvement. I will meet in person with daughter tomorrow who plans to be at bedside. All questions/concerns addressed. Emotional support provided.   Primary Decision Maker Patient and adult children    SUMMARY OF RECOMMENDATIONS   - Will have in person discussion with daughter at bedside tomorrow  Code Status/Advance Care Planning:  Full code   Symptom Management:   Per attending  Palliative Prophylaxis:   Aspiration, Bowel Regimen, Delirium Protocol, Frequent Pain Assessment, Oral Care and Turn Reposition  Additional Recommendations (Limitations, Scope, Preferences):  Full Scope Treatment  Psycho-social/Spiritual:   Desire for further Chaplaincy support:no  Additional Recommendations: Caregiving  Support/Resources  Prognosis:   Overall prognosis poor with continued functional decline.   Discharge Planning: To Be Determined      Primary Diagnoses: Present on Admission: . Acute ischemic stroke ([place]) . Stroke [facility])   I have reviewed the medical record, interviewed the patient and family, and examined the patient. The following aspects are pertinent.  Past Medical History:  Diagnosis Date  . ACS (acute coronary syndrome) ([place]) 06/12/2015  .  Acute on chronic blood loss anemia 11/25/2017  . Arthritis   . Asthma   . Cerebrovascular accident (CVA) due to embolism ([place]) 10/26/2016  . Chronic pain   . Constipation   . Diabetes mellitus without complication ([place])   . Duodenitis   . Embolic stroke ([place])   . Falls frequently   . Focal seizure ([place])   . GERD (gastroesophageal reflux disease)   . Hypertension   . Impacted fracture 02/15/2017   right glenoid  . Pneumonia 10/2015  . Seizures ([place])    last seizure March 2015  . Small bowel obstruction ([place]) 11/25/2017   Social History    Socioeconomic History  . Marital status: Single    Spouse name: Not on file  . Number of children: Not on file  . Years of education: Not on file  . Highest education level: Not on file  Occupational History  . Not on file  Tobacco Use  . Smoking status: Former Smoker    Quit date: 2010    Years since quitting: 11.1  . Smokeless tobacco: Never Used  Substance and Sexual Activity  . Alcohol use: No    Comment: admits to 2 drinks/week  . Drug use: No  . Sexual activity: Never  Other Topics Concern  . Not on file  Social History Narrative  . Not on file   Social Determinants of Health   Financial Resource Strain:   . Difficulty of Paying Living Expenses: Not on file  Food Insecurity:   . Worried About Charity fundraiser in the Last Year: Not on file  . Ran Out of Food in the Last Year: Not on file  Transportation Needs:   . Lack of Transportation (Medical): Not on file  . Lack of Transportation (Non-Medical): Not on file  Physical Activity:   . Days of Exercise per Week: Not on file  . Minutes of Exercise per Session: Not on file  Stress:   . Feeling of Stress : Not on file  Social Connections:   . Frequency of Communication with Friends and Family: Not on file  . Frequency of Social Gatherings with Friends and Family: Not on file  . Attends Religious Services: Not on file  . Active Member of Clubs or Organizations: Not on file  . Attends Archivist Meetings: Not on file  . Marital Status: Not on file   Family History  Problem Relation Age of Onset  . Kidney failure Mother   . Hypertension Sister   . Hypertension Brother    Scheduled Meds: . acetaminophen  1,000 mg Oral Q8H  . allopurinol  300 mg Oral Daily  . apixaban  2.5 mg Oral BID  . atorvastatin  10 mg Oral q1800  . calcium-vitamin D  2 tablet Oral Q breakfast  . colestipol  1 g Oral BID  . feeding supplement (ENSURE ENLIVE)  237 mL Oral BID BM  . lactulose  20 g Oral TID  .  lipase/protease/amylase  36,000 Units Oral TID AC  . metoprolol succinate  25 mg Oral Daily  . nystatin  5 mL Oral QID  . pantoprazole  40 mg Oral BID  . predniSONE  10 mg Oral Q breakfast  . valproic acid  500 mg Oral TID   Continuous Infusions: . cefTRIAXone (ROCEPHIN)  IV Stopped (12/28/19 1111)   PRN Meds:.albuterol, ondansetron (ZOFRAN) IV, oxyCODONE Allergies  Allergen Reactions  . Ivp Dye [Iodinated Diagnostic Agents] Itching  . Metrizamide Itching  Review of Systems  Unable to perform ROS: Other    Physical Exam Vitals and nursing note reviewed.  Constitutional:      General: She is not in acute distress.    Appearance: She is ill-appearing.  Cardiovascular:     Rate and Rhythm: Tachycardia present.  Pulmonary:     Effort: Pulmonary effort is normal. No tachypnea, accessory muscle usage or respiratory distress.  Abdominal:     Palpations: Abdomen is soft.  Neurological:     Mental Status: She is alert.     Comments: Orientation difficult to determine with difficulty communicating; follows all commands     Vital Signs: BP (!) 154/101 (BP Location: Left Arm)   Pulse (!) 112   Temp 98.1 F (36.7 C) (Oral)   Resp (!) 24   Ht 5' (1.524 m)   Wt 47.6 kg   SpO2 100%   BMI 20.51 kg/m  Pain Scale: 0-10 POSS *See Group Information*: S-Acceptable,Sleep, easy to arouse Pain Score: 4    SpO2: SpO2: 100 % O2 Device:SpO2: 100 % O2 Flow Rate: .   IO: Intake/output summary:   Intake/Output Summary (Last 24 hours) at 12/29/2019 0917 Last data filed at 12/29/2019 0329 Gross per 24 hour  Intake 902.5 ml  Output 800 ml  Net 102.5 ml    LBM: Last BM Date: 12/26/19 Baseline Weight: Weight: 47.6 kg Most recent weight: Weight: 47.6 kg     Palliative Assessment/Data:     Time In/Out: 1045-1110, 1530-1600 Time Total: 55 min Greater than 50%  of this time was spent counseling and coordinating care related to the above assessment and plan.  Signed by: Vinie Sill, NP Palliative Medicine Team Pager # [phone] (M-F 8a-5p) Team Phone # [phone] (Nights/Weekends)

## 2019-12-29 NOTE — Progress Notes (Signed)
Initial Nutrition Assessment  DOCUMENTATION CODES:   Not applicable  INTERVENTION:   Magic cup TID with meals, each supplement provides 290 kcal and 9 grams of protein  Continue Ensure Enlive po BID, each supplement provides 350 kcal and 20 grams of protein   NUTRITION DIAGNOSIS:   Inadequate oral intake related to lethargy/confusion as evidenced by meal completion < 50%.   GOAL:   Patient will meet greater than or equal to 90% of their needs    MONITOR:   PO intake, Supplement acceptance, Weight trends, Labs, I & O's  REASON FOR ASSESSMENT:   Consult Assessment of nutrition requirement/status  ASSESSMENT:   Pt with a PMH significant for embolic stroke, upper extremity DVTs, HTN, DM-2, pancreatic insufficiency, chronic constipation and duodenitis presenting with altered mental status, forgetfulness, word finding difficulty and intermittent left hand numbness, unsteadiness and generalized weakness. MRI revealed right MCA infarct.  Pt unable to answer RD questions at time of visit. Pt repeating "I want to go home."   Discussed pt with RN who reports variable meal intake and pt sometimes refusing Ensure Enlive.  Diet Order: Heart Healthy/CHO Mod PO Intake: 5-75% x2 recorded meals (40% average intake)   Medications reviewed and include: Oscal with D, Ensure Enlive BID, Chronulac, Creon, Deltasone  Labs reviewed: Mg 2.9 (H)  UOP: x24 hours I/O: +419.69ml since admit  NUTRITION - FOCUSED PHYSICAL EXAM:    Most Recent Value  Orbital Region  No depletion  Upper Arm Region  No depletion  Thoracic and Lumbar Region  Mild depletion  Buccal Region  No depletion  Temple Region  Mild depletion  Clavicle Bone Region  No depletion  Clavicle and Acromion Bone Region  Moderate depletion  Scapular Bone Region  Mild depletion  Dorsal Hand  No depletion  Patellar Region  No depletion  Anterior Thigh Region  Mild depletion  Posterior Calf Region  Mild depletion  Edema  (RD Assessment)  Mild  Hair  Reviewed  Eyes  Reviewed  Mouth  Reviewed  Skin  Reviewed  Nails  Reviewed       Diet Order:   Diet Order            Diet heart healthy/carb modified Room service appropriate? Yes; Fluid consistency: Thin  Diet effective now              EDUCATION NEEDS:   Not appropriate for education at this time  Skin:  Skin Assessment: Reviewed RN Assessment  Last BM:  12/26/19  Height:   Ht Readings from Last 1 Encounters:  12/24/19 5' (1.524 m)    Weight:   Wt Readings from Last 1 Encounters:  12/24/19 47.6 kg    Ideal Body Weight:  45.45 kg  BMI:  Body mass index is 20.51 kg/m.  Estimated Nutritional Needs:   Kcal:  1200-1400  Protein:  60-70grams  Fluid:  >1.2L/d    [name] Gavia, MS, RD, LDN RD pager number and weekend/on-call pager number located in Amion.

## 2019-12-29 NOTE — Progress Notes (Signed)
PROGRESS NOTE  Amber [name] GDJ:[id number] DOB: 09/13/1956   PCP: [name], MD  Patient is from: Home.  DOA: 12/24/2019 LOS: 4  Brief Narrative / Interim history: 64 year old female with history of embolic stroke, upper extremity DVT's on Eliquis, PFO, HTN, DM-2, pancreatic insufficiency, chronic constipation and duodenitis presenting with altered mental status, forgetfulness, word finding difficulty and intermittent left hand numbness, unsteadiness and generalized weakness.  In ED, CT head with evidence of subacute CVA.  Neurology consulted and admitted.    MRI revealed new posterior right MCA infarct.  MRA without LVO or significant stenosis.  Carotid Doppler with 40 to 59% left ICA stenosis and 1 to 39% right ICA stenosis.  Transcranial Doppler poor quality.  TTE with normal EF without evidence of thrombus.  Upper and lower extremity Dopplers negative for DVT.  Patient was empirically treated with ceftriaxone for UTI.  Hospital course complicated by AKI, which she has been getting twice a day.  She only takes as needed at home.  She also have some metabolic encephalopathy likely from a high dose opiate.  Subjective: No major events overnight or this morning.  She repeatedly says I cannot pee.  She has a p.o. week with small amount of urine in the canister.  She is somewhat incoherent.  She is oriented to self place only.  Does not appear to be in distress.   Objective: Vitals:   12/28/19 2359 12/29/19 0306 12/29/19 0858 12/29/19 0859  BP: 99/77 134/86 (!) 154/101   Pulse: 78 74 (!) 112   Resp: 19 15 (!) 24   Temp: 98.2 F (36.8 C) 97.8 F (36.6 C)  98.1 F (36.7 C)  TempSrc: Oral   Oral  SpO2: 100% 100% 100%   Weight:      Height:        Intake/Output Summary (Last 24 hours) at 12/29/2019 1051 Last data filed at 12/29/2019 0329 Gross per 24 hour  Intake 902.5 ml  Output 600 ml  Net 302.5 ml   Filed Weights   12/24/19 1823  Weight: 47.6 kg    Examination:  GENERAL:  Frail elderly female.  No apparent distress. HEENT: MMM.  Vision and hearing grossly intact.  NECK: Supple.  No apparent JVD.  RESP:  No IWOB.  Fair aeration bilaterally. CVS:  RRR. Heart sounds normal.  ABD/GI/GU: Bowel sounds present. Soft. Non tender.  MSK/EXT:  Moves extremities. No apparent deformity. No edema.  SKIN: no apparent skin lesion or wound NEURO: Awake, alert and oriented self and place.  Aphasia/incoherent speech.  No facial asymmetry.  PERRL.  Moves all extremities but globally weak. PSYCH: Somewhat confused and incoherent.  Procedures:  None  Assessment & Plan: Acute right MCA embolic CVA: presented with constellation of symptoms including word finding difficulty, left hand numbness and unsteadiness.  MRI brain with acute posterior right MCA CVA.  MRA, carotid Doppler, upper and lower extremity Doppler and echocardiogram without significant finding.  History of upper extremity DVT and known PFO.  LDL 46.  A1c 4.4. -Appreciate guidance by neurology -Continue home Eliquis-has history of PFO and DVT.  PFO closure not entertained due to noncompliance. -Continue statin -PT/OT/SLP-recommended SNF but patient prefers to go home..  Acute urinary tract infection: Has UTI symptoms.  Urine culture multiple species x2 -Completed 5 days of ceftriaxone empirically  Acute metabolic encephalopathy: Likely a combination of hepatic encephalopathy/hyperammonia and sedating medications.  Hyperammonia likely due to Depakote.  Depakote level slightly low.  -Adjusted pain medications as below. -Continue  lactulose -Reorientation and delirium precautions.  Chronic diastolic CHF: Echo with EF of 60 to 65% and G1 DD but no other significant structural functional abnormalities.  Appears euvolemic.  Per daughter, takes Lasix as needed at home.  Has been getting twice daily and developed AKI. -Lasix discontinued.  Received gentle IV fluid. -Monitor fluid status  AKI: Likely due to diuretics.   Has been getting p.o. Lasix 40 mg twice daily here.  She only takes as needed at home.  Creatinine seems to have plateaued. -Lasix discontinued.  Received gentle IV fluid. -Recheck renal function in the morning  Essential hypertension: Normotensive -Continue home metoprolol  History of rheumatoid arthritis on chronic steroid Chronic pain syndrome -Reduce prednisone to home dose. -Continue scheduled Tylenol, as needed oxycodone at reduced dose -Discontinued Zanaflex.  History of upper extremity DVT: Upper and lower extremity Dopplers negative for DVT this admission. -Continue Eliquis  Pancreatic insufficiency -Continue home Creon  Normocytic anemia: Baseline Hgb 8-9 >9.5 (admit)> 8.1.  Anemia panel consistent with ACD -Continue monitoring  Controlled DM-2: A1c 4.4%.  Not on medication at home.  Abdominal pain/tenderness: KUB did not reveal obstruction or constipation.  Having regular bowel movements. -Lactulose as above  Debility/physical deconditioning -PT/OT-recommended SNF-patient prefers to go home.  Urinary retention? Has about 800 cc UOP with one unmeasured void in the last 24 hours.  Reports difficulty urinating. -Bladder scan intermittently  Goal of care: Per chart review, patient seems to have been improved with out oral care in the past.  Family has no recollection of this.  However, daughter appreciate palliative care consult. -Palliative care consulted.           DVT prophylaxis: On Eliquis Code Status: Full code Family Communication: Updated patient's daughter over the phone  Discharge barrier: AKI and encephalopathy Patient is from: Home Final disposition: Likely home when AKI and encephalopathy resolves. Refused SNF.  Consultants: Neurology   Microbiology summarized: [id number] and influenza PCR negative. Urine culture with multiple species. Repeat urine culture pending.  Sch Meds:  Scheduled Meds: . acetaminophen  1,000 mg Oral Q8H  .  allopurinol  300 mg Oral Daily  . apixaban  2.5 mg Oral BID  . atorvastatin  10 mg Oral q1800  . calcium-vitamin D  2 tablet Oral Q breakfast  . colestipol  1 g Oral BID  . feeding supplement (ENSURE ENLIVE)  237 mL Oral BID BM  . lactulose  20 g Oral TID  . lipase/protease/amylase  36,000 Units Oral TID AC  . metoprolol succinate  25 mg Oral Daily  . nystatin  5 mL Oral QID  . pantoprazole  40 mg Oral BID  . predniSONE  10 mg Oral Q breakfast  . valproic acid  500 mg Oral TID   Continuous Infusions:  PRN Meds:.albuterol, ondansetron (ZOFRAN) IV, oxyCODONE  Antimicrobials: Anti-infectives (From admission, onward)   Start     Dose/Rate Route Frequency Ordered Stop   12/28/19 1000  cefTRIAXone (ROCEPHIN) 1 g in sodium chloride 0.9 % 100 mL IVPB     1 g 200 mL/hr over 30 Minutes Intravenous Every 24 hours 12/27/19 1449 12/29/19 1018   12/25/19 1045  cefTRIAXone (ROCEPHIN) 1 g in sodium chloride 0.9 % 100 mL IVPB  Status:  Discontinued     1 g 200 mL/hr over 30 Minutes Intravenous Every 24 hours 12/25/19 1034 12/27/19 1449       I have personally reviewed the following labs and images: CBC: Recent Labs  Lab 12/24/19 1845 12/25/19  1660 12/27/19 1105 12/28/19 0508 12/29/19 0148  WBC 13.1* 11.1* 5.7 4.4 4.1  NEUTROABS 5.6  --   --   --   --   HGB 9.5* 8.3* 8.1* 7.5* 8.1*  HCT 28.5* 24.3* 23.5* 22.3* 24.3*  MCV 79.6* 78.1* 78.1* 79.1* 79.2*  PLT 185 163 140* 125* 116*   BMP &GFR Recent Labs  Lab 12/24/19 1845 12/25/19 0359 12/27/19 1105 12/28/19 0508 12/29/19 0148  NA 136 141 138 141 142  K 3.7 3.8 4.3 4.1 5.0  CL 110 114* 109 112* 112*  CO2 20* 20* 16* 21* 16*  GLUCOSE 99 87 74 86 158*  BUN 11 9 13 12 13   CREATININE 0.83 0.78 1.17* 1.47* 1.43*  CALCIUM 8.1* 7.9* 7.7* 8.0* 7.9*  MG  --   --  1.3* 2.9* 2.6*  PHOS  --   --  4.1 4.6 5.0*   Estimated Creatinine Clearance: 29.7 mL/min (A) (by C-G formula based on SCr of 1.43 mg/dL (H)). Liver & Pancreas: Recent  Labs  Lab 12/24/19 1845 12/28/19 0508  AST 27 16  ALT 20 12  ALKPHOS 102 74  BILITOT 0.5 0.3  PROT 6.0* 4.2*  ALBUMIN 2.3* 1.5*   No results for input(s): LIPASE, AMYLASE in the last 168 hours. Recent Labs  Lab 12/27/19 1105 12/28/19 0508  AMMONIA 76* 33   Diabetic: No results for input(s): HGBA1C in the last 72 hours. No results for input(s): GLUCAP in the last 168 hours. Cardiac Enzymes: No results for input(s): CKTOTAL, CKMB, CKMBINDEX, TROPONINI in the last 168 hours. No results for input(s): PROBNP in the last 8760 hours. Coagulation Profile: No results for input(s): INR, PROTIME in the last 168 hours. Thyroid Function Tests: No results for input(s): TSH, T4TOTAL, FREET4, T3FREE, THYROIDAB in the last 72 hours. Lipid Profile: No results for input(s): CHOL, HDL, LDLCALC, TRIG, CHOLHDL, LDLDIRECT in the last 72 hours. Anemia Panel: Recent Labs    12/27/19 1543  VITAMINB12 883  FOLATE 39.5  FERRITIN 606*  TIBC NOT CALCULATED  IRON 47  RETICCTPCT 1.8   Urine analysis:    Component Value Date/Time   COLORURINE YELLOW 12/25/2019 0510   APPEARANCEUR HAZY (A) 12/25/2019 0510   LABSPEC 1.015 12/25/2019 0510   PHURINE 6.0 12/25/2019 0510   GLUCOSEU NEGATIVE 12/25/2019 0510   HGBUR NEGATIVE 12/25/2019 0510   BILIRUBINUR NEGATIVE 12/25/2019 0510   KETONESUR NEGATIVE 12/25/2019 0510   PROTEINUR NEGATIVE 12/25/2019 0510   UROBILINOGEN 1.0 07/27/2015 1052   NITRITE NEGATIVE 12/25/2019 0510   LEUKOCYTESUR MODERATE (A) 12/25/2019 0510   Sepsis Labs: Invalid input(s): PROCALCITONIN, LACTICIDVEN  Microbiology: Recent Results (from the past 240 hour(s))  Respiratory Panel by RT PCR (Flu A&B, Covid) - Nasopharyngeal Swab     Status: None   Collection Time: 12/24/19  7:00 PM   Specimen: Nasopharyngeal Swab  Result Value Ref Range Status   SARS Coronavirus 2 by RT PCR NEGATIVE NEGATIVE Final    Comment: (NOTE) SARS-CoV-2 target nucleic acids are NOT DETECTED. The  SARS-CoV-2 RNA is generally detectable in upper respiratoy specimens during the acute phase of infection. The lowest concentration of SARS-CoV-2 viral copies this assay can detect is 131 copies/mL. A negative result does not preclude SARS-Cov-2 infection and should not be used as the sole basis for treatment or other patient management decisions. A negative result may occur with  improper specimen collection/handling, submission of specimen other than nasopharyngeal swab, presence of viral mutation(s) within the areas targeted by this assay, and  inadequate number of viral copies (<131 copies/mL). A negative result must be combined with clinical observations, patient history, and epidemiological information. The expected result is Negative. Fact Sheet for Patients:  https://www.moore.com/ Fact Sheet for Healthcare Providers:  https://www.young.biz/ This test is not yet ap proved or cleared by the [place] FDA and  has been authorized for detection and/or diagnosis of SARS-CoV-2 by FDA under an Emergency Use Authorization (EUA). This EUA will remain  in effect (meaning this test can be used) for the duration of the COVID-19 declaration under Section 564(b)(1) of the Act, 21 U.S.C. section 360bbb-3(b)(1), unless the authorization is terminated or revoked sooner.    Influenza A by PCR NEGATIVE NEGATIVE Final   Influenza B by PCR NEGATIVE NEGATIVE Final    Comment: (NOTE) The Xpert Xpress SARS-CoV-2/FLU/RSV assay is intended as an aid in  the diagnosis of influenza from Nasopharyngeal swab specimens and  should not be used as a sole basis for treatment. Nasal washings and  aspirates are unacceptable for Xpert Xpress SARS-CoV-2/FLU/RSV  testing. Fact Sheet for Patients: https://www.moore.com/ Fact Sheet for Healthcare Providers: https://www.young.biz/ This test is not yet approved or cleared by the [place] FDA and  has been authorized for detection and/or diagnosis of SARS-CoV-2 by  FDA under an Emergency Use Authorization (EUA). This EUA will remain  in effect (meaning this test can be used) for the duration of the  Covid-19 declaration under Section 564(b)(1) of the Act, 21  U.S.C. section 360bbb-3(b)(1), unless the authorization is  terminated or revoked. Performed at [facility], [address] [place], [place] [address]   Culture, Urine     Status: Abnormal   Collection Time: 12/25/19  5:10 AM   Specimen: Urine, Random  Result Value Ref Range Status   Specimen Description URINE, RANDOM  Final   Special Requests   Final    culture prior urine sample Performed at [facility], [address] [place], [place] [address]    Culture MULTIPLE SPECIES PRESENT, SUGGEST RECOLLECTION (A)  Final   Report Status 12/26/2019 FINAL  Final  Culture, Urine     Status: Abnormal   Collection Time: 12/26/19  1:07 PM   Specimen: Urine, Catheterized  Result Value Ref Range Status   Specimen Description URINE, CATHETERIZED  Final   Special Requests   Final    NONE Performed at [facility], [address], [place], [place] [address]    Culture MULTIPLE SPECIES PRESENT, SUGGEST RECOLLECTION (A)  Final   Report Status 12/27/2019 FINAL  Final    Radiology Studies: No results found.    Amber [name] T. Azrael Huss [facility]  If 7PM-7AM, please contact night-coverage [email or web address] Password [facility] 12/29/2019, 10:51 AM

## 2019-12-30 DIAGNOSIS — R319 Hematuria, unspecified: Secondary | ICD-10-CM

## 2019-12-30 DIAGNOSIS — G894 Chronic pain syndrome: Secondary | ICD-10-CM

## 2019-12-30 LAB — CBC
HCT: 25.2 % — ABNORMAL LOW (ref 36.0–46.0)
Hemoglobin: 8.4 g/dL — ABNORMAL LOW (ref 12.0–15.0)
MCH: 26.3 pg (ref 26.0–34.0)
MCHC: 33.3 g/dL (ref 30.0–36.0)
MCV: 78.8 fL — ABNORMAL LOW (ref 80.0–100.0)
Platelets: 131 10*3/uL — ABNORMAL LOW (ref 150–400)
RBC: 3.2 MIL/uL — ABNORMAL LOW (ref 3.87–5.11)
RDW: 16.6 % — ABNORMAL HIGH (ref 11.5–15.5)
WBC: 6.4 10*3/uL (ref 4.0–10.5)
nRBC: 0 % (ref 0.0–0.2)

## 2019-12-30 LAB — AMMONIA: Ammonia: 49 umol/L — ABNORMAL HIGH (ref 9–35)

## 2019-12-30 LAB — BASIC METABOLIC PANEL
Anion gap: 12 (ref 5–15)
BUN: 12 mg/dL (ref 8–23)
CO2: 16 mmol/L — ABNORMAL LOW (ref 22–32)
Calcium: 8.2 mg/dL — ABNORMAL LOW (ref 8.9–10.3)
Chloride: 111 mmol/L (ref 98–111)
Creatinine, Ser: 1.35 mg/dL — ABNORMAL HIGH (ref 0.44–1.00)
GFR calc Af Amer: 49 mL/min — ABNORMAL LOW (ref >60)
GFR calc non Af Amer: 42 mL/min — ABNORMAL LOW (ref >60)
Glucose, Bld: 95 mg/dL (ref 70–99)
Potassium: 5 mmol/L (ref 3.5–5.1)
Sodium: 139 mmol/L (ref 135–145)

## 2019-12-30 LAB — MAGNESIUM: Magnesium: 2.4 mg/dL (ref 1.7–2.4)

## 2019-12-30 LAB — PHOSPHORUS: Phosphorus: 3.7 mg/dL (ref 2.5–4.6)

## 2019-12-30 MED ORDER — FUROSEMIDE 40 MG PO TABS: 40.0000 mg | ORAL_TABLET | Freq: Every day | ORAL | 0 refills | Status: DC | PRN

## 2019-12-30 MED ORDER — ACETAMINOPHEN 80 MG RE SUPP
975.0000 mg | Freq: Three times a day (TID) | RECTAL | Status: DC
  Filled 2019-12-30: qty 2

## 2019-12-30 MED ORDER — ACETAMINOPHEN 650 MG RE SUPP
975.0000 mg | Freq: Three times a day (TID) | RECTAL | Status: AC
  Administered 2019-12-30: 975 mg via RECTAL
  Filled 2019-12-30: qty 2

## 2019-12-30 MED ORDER — CALCIUM CARBONATE-VITAMIN D 500-200 MG-UNIT PO TABS: 2.0000 | ORAL_TABLET | Freq: Every day | ORAL | 0 refills | Status: DC

## 2019-12-30 MED ORDER — VALPROIC ACID 250 MG PO CAPS
250.0000 mg | ORAL_CAPSULE | Freq: Three times a day (TID) | ORAL | Status: DC
  Administered 2019-12-30 (×3): 250 mg via ORAL
  Filled 2019-12-30 (×3): qty 1

## 2019-12-30 MED ORDER — LACTULOSE 10 GM/15ML PO SOLN: ORAL | 1 refills | Status: DC

## 2019-12-30 MED ORDER — ACETAMINOPHEN 500 MG PO TABS: 1000.0000 mg | ORAL_TABLET | Freq: Three times a day (TID) | ORAL | 0 refills | Status: AC

## 2019-12-30 MED ORDER — ENSURE ENLIVE PO LIQD: 237.0000 mL | Freq: Two times a day (BID) | ORAL | 1 refills | Status: AC

## 2019-12-30 NOTE — Progress Notes (Signed)
Administrator, sports received referral for pt to participate in [facility] palliative program once pt discharges. Called pt's granddaughter, Helmut Muster with no answer and tried to reach pt in her room with no answer. Liaison to follow while in hospital to alert [facility] palliative team who will then outreach family to arrange Palliative admission visit.   Please do not hesitate to call with any questions and thank you for the referral.   Trena Platt, RN [facility] Liaison  [phone]

## 2019-12-30 NOTE — Progress Notes (Signed)
Palliative:  I came to visit with [name] and daughter has left to get family in order to take patient home and plans for discharge home today. I recommend outpatient palliative to follow up at home. Patient lives with granddaughter but I have communicated with daughter, Amber [name], [phone]. Family live nearby and are all involved in care.   No charge  Yong Channel, NP Palliative Medicine Team Pager [phone] (Please see [email or web address] for schedule) Team Phone [phone]

## 2019-12-30 NOTE — Evaluation (Signed)
Clinical/Bedside Swallow Evaluation Patient Details  Name: Amber [name] MRN: [id number] Date of Birth: [birth year]  Today's Date: 12/30/2019 Time: SLP Start Time (ACUTE ONLY): 0907 SLP Stop Time (ACUTE ONLY): 0921 SLP Time Calculation (min) (ACUTE ONLY): 14 min  Past Medical History:  Past Medical History:  Diagnosis Date  . ACS (acute coronary syndrome) (HCC) 06/12/2015  . Acute on chronic blood loss anemia 11/25/2017  . Arthritis   . Asthma   . 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 Chronic pain   . Constipation   . Diabetes mellitus without complication (HCC)   . Duodenitis   . Embolic stroke (HCC)   . Falls frequently   . Focal seizure (HCC)   . GERD (gastroesophageal reflux disease)   . Hypertension   . Impacted fracture 02/15/2017   right glenoid  . Pneumonia 10/2015  . Seizures (HCC)    last seizure March 2015  . Small bowel obstruction (HCC) 11/25/2017   Past Surgical History:  Past Surgical History:  Procedure Laterality Date  . APPENDECTOMY    . BACK SURGERY    . CHOLECYSTECTOMY    . TEE WITHOUT CARDIOVERSION N/A 10/30/2016   Procedure: TRANSESOPHAGEAL ECHOCARDIOGRAM (TEE);  Surgeon: Lars Masson, MD;  Location: [facility] ENDOSCOPY;  Service: Cardiovascular;  Laterality: N/A;   HPI:  Pt is a 64 y.o. female with medical history significant for history of embolic stroke, hypertension, type 2 diabetes, chronic constipation and duodenitis status post cholecystectomy on Creon replacement who presented from home on 12/24/2019 with a chief complaint of worsening confusion.  CT of the head revealed chronic atrophic and ischemic changes and new area of decreased attenuation in the posterior parietal lobe consistent with subacute ischemia. Bedside swallow eval of 01/01/19: coughing with large boluses of thin liquids. Pt was seen for speech-language evaluation on 12/25/19. SLP was re-consulted for swallowing due to nursing reporting difficulty swallowing pills.    Assessment / Plan / Recommendation Clinical Impression  Pt denied a history of dysphagia but her reliability as a historian is questioned. A complete oral mechanism exam could not be completed due to pt's difficulty following commands. However, oral motor musculature was grossly WFL. She tolerated solids and liquids without overt s/sx of aspiration, Mastication time was prolonged with regular texture solids secondary to edentulous status but functional for dysphagia 2 solids. A dysphagia 2 diet with thin lqiuds is recommended at this time. Medications may be crushed and administered with puree. SLP will follow to assess diet tolerance.  SLP Visit Diagnosis: Dysphagia, unspecified (R13.10)    Aspiration Risk  No limitations    Diet Recommendation Dysphagia 2 (Fine chop);Thin liquid   Liquid Administration via: Cup;Straw Medication Administration: Crushed with puree Supervision: Staff to assist with self feeding Postural Changes: Seated upright at 90 degrees;Remain upright for at least 30 minutes after po intake    Other  Recommendations Oral Care Recommendations: Oral care BID   Follow up Recommendations None      Frequency and Duration min 2x/week  2 weeks       Prognosis Prognosis for Safe Diet Advancement: Fair Barriers to Reach Goals: Cognitive deficits;Other (Comment)(dental status)      Swallow Study   General Date of Onset: 12/29/19 HPI: Pt is a 64 y.o. female with medical history significant for history of embolic stroke, hypertension, type 2 diabetes, chronic constipation and duodenitis status post cholecystectomy on Creon replacement who presented from home on 12/24/2019 with a chief complaint of worsening confusion.  CT  of the head revealed chronic atrophic and ischemic changes and new area of decreased attenuation in the posterior parietal lobe consistent with subacute ischemia. Bedside swallow eval of 01/01/19: coughing with large boluses of thin liquids. Pt was seen for  speech-language evaluation on 12/25/19. SLP was re-consulted for swallowing due to nursing reporting difficulty swallowing pills. Type of Study: Bedside Swallow Evaluation Previous Swallow Assessment: None Diet Prior to this Study: NPO Temperature Spikes Noted: No Respiratory Status: Room air History of Recent Intubation: No Behavior/Cognition: Alert;Cooperative;Pleasant mood Oral Cavity Assessment: Within Functional Limits Oral Care Completed by SLP: Recent completion by staff Oral Cavity - Dentition: Edentulous Vision: Functional for self-feeding Self-Feeding Abilities: Needs assist Patient Positioning: Upright in chair;Postural control adequate for testing Baseline Vocal Quality: Hoarse Volitional Cough: Strong Volitional Swallow: Able to elicit    Oral/Motor/Sensory Function Overall Oral Motor/Sensory Function: Within functional limits   Ice Chips Ice chips: Within functional limits Presentation: Spoon   Thin Liquid Thin Liquid: Within functional limits Presentation: Cup;Straw;Spoon    Nectar Thick Nectar Thick Liquid: Not tested   Honey Thick Honey Thick Liquid: Not tested   Puree Puree: Within functional limits Presentation: Spoon   Solid     Solid: Impaired Presentation: (Administered by SLP) Oral Phase Impairments: Impaired mastication Other Comments: Prolonged mastication with regular texture; WFL for d2     Amber [name], [place], [place] Office number [phone] Pager [phone]  Horton Marshall 12/30/2019,11:05 AM

## 2019-12-30 NOTE — Discharge Summary (Signed)
Physician Discharge Summary  Amber [name] LOV:[id number] DOB: [birth year] DOA: 12/24/2019  PCP: [name], MD  Admit date: 12/24/2019 Discharge date: 12/30/2019  Admitted From: Home Disposition: Home  Recommendations for Outpatient Follow-up:  1. Follow ups as below. 2. Please obtain CBC/BMP/Mag at follow up 3. Please follow up on the following pending results: None  Home Health: PT, hospice Equipment/Devices: none  Discharge Condition: Stable CODE STATUS: Full code  Follow-up Information    [name], MD. Schedule an appointment as soon as possible for a visit in 1 week(s).   Specialty: Internal Medicine Contact information: [address] [place] [address] [phone]        [facility] Follow up in 2 week(s).   Specialty: Radiology Contact information: [address] [place] [address] [phone]       [facility] Follow up.   Specialty: Home Health Services Why: The home health agency will contact you for the first home visit. Contact information: [address] [place] [address] [phone]          Hospital Course: 64 year old female with history of embolic stroke, upper extremity DVT's on Eliquis, PFO, HTN, DM-2, pancreatic insufficiency, chronic constipation and duodenitis presenting with altered mental status, forgetfulness, word finding difficulty and intermittent left hand numbness, unsteadiness and generalized weakness.  In ED, CT head with evidence of subacute CVA.  Neurology consulted and admitted.    MRI revealed new posterior right MCA infarct.  MRA without LVO or significant stenosis.  Carotid Doppler with 40 to 59% left ICA stenosis and 1 to 39% right ICA stenosis.  Transcranial Doppler poor quality.  TTE with normal EF without evidence of thrombus.  Upper and lower extremity Dopplers negative for DVT.  Patient was empirically treated with  ceftriaxone for UTI.  Hospital course complicated by AKI likely due to scheduled Lasix that she takes as needed at home. She also has had metabolic encephalopathy likely from a high dose home opiate.  AKI improved after holding Lasix.  Encephalopathy improved after adjusting home pain medications.  Ammonia was also slightly elevated, likely due to her Depakote.  She was started on lactulose.  Patient was evaluated by PT/OT who recommended SNF. However, patient chose to go home with home health and hospice.   See individual problem list below for more on hospital course.  Discharge Diagnoses:  Acute right MCA embolic CVA: presented with constellation of symptoms including word finding difficulty, left hand numbness and unsteadiness.  MRI brain with acute posterior right MCA CVA.  MRA, carotid Doppler, upper and lower extremity Doppler and echocardiogram without significant finding.  History of upper extremity DVT and known PFO.  LDL 46.  A1c 4.4. -Continue home Eliquis-has history of PFO and DVT.  PFO closure not entertained due to noncompliance. -Continue statin -PT/OT/SLP-recommended SNF but patient chose to go home.  Acute urinary tract infection: Has UTI symptoms.  Urine culture with multiple species x2 -Completed 5 days of ceftriaxone empirically  Acute metabolic encephalopathy: Likely due to sedating medications.  Ammonia slightly elevated likely due to Depakote. Depakote level slightly low.  Encephalopathy improved. -Discharged on lactulose -Recommended taking Tylenol around-the-clock and oxycodone only as needed for severe breakthrough pain. -Discontinue Zanaflex.  Chronic diastolic CHF: Echo with EF of 60 to 65% and G1 DD but no other significant structural functional abnormalities.  Appears euvolemic.  Per daughter, takes Lasix as needed at home.  Has been getting twice  daily and developed AKI. -Resume Lasix as needed in 3 to 4 days -Follow-up with cardiologist in 3 to 4  weeks  AKI: Likely due to scheduled Lasix that she has been taking as needed at home.   AKI improved -To resume Lasix as needed in 3 to 4 days -Repeat BMP in 1 week.  Essential hypertension: Normotensive -Continue home medications  History of rheumatoid arthritis on chronic steroid Chronic pain syndrome -Continue home prednisone, scheduled Tylenol and as needed oxycodone -Encouraged to use the oxycodone only for severe and breakthrough pain.  History of upper extremity DVT: Upper and lower extremity Dopplers negative for DVT this admission. -Continue Eliquis  Pancreatic insufficiency -Continue home Creon  Normocytic anemia: Baseline Hgb 8-9 >9.5 (admit)> 8.1> 8.4.  Anemia panel consistent with ACD -Recheck CBC at follow-up.  Controlled DM-2: A1c 4.4%.  Not on medication at home.  Abdominal pain/tenderness: KUB did not reveal obstruction or constipation.  Having regular bowel movements. -Lactulose as above  Debility/physical deconditioning -PT/OT-recommended SNF-patient chose to go home.  Urinary retention?  Has been voiding regularly.  Goal of care: enrolled with palliative care previously. -Palliative care recommended outpatient palliative follow-up.  Nutritional need -Multivitamin and Ensure [facility]  Discharge Instructions  Discharge Instructions    (HEART FAILURE PATIENTS) Call MD:  Anytime you have any of the following symptoms: 1) 3 pound weight gain in 24 hours or 5 pounds in 1 week 2) shortness of breath, with or without a dry hacking cough 3) swelling in the hands, feet or stomach 4) if you have to sleep on extra pillows at night in order to breathe.   Complete by: As directed    Call MD for:  extreme fatigue   Complete by: As directed    Call MD for:  persistant dizziness or light-headedness   Complete by: As directed    Call MD for:  persistant nausea and vomiting   Complete by: As directed    Call MD for:  temperature >100.4   Complete by: As  directed    Diet - low sodium heart healthy   Complete by: As directed    Discharge instructions   Complete by: As directed    It has been a pleasure taking care of you! You were hospitalized and treated for stroke and urinary tract infection.  You were treated medically for both conditions.  It is very important that you take your blood thinner as prescribed.  We have made some changes to your medication and added new medications during this hospitalization. Please review your new medication list and the directions before you take your medications.  You are on a very high dose of oxycodone which is potentially dangerous.  We strongly recommend you cut down on this and discuss with your prescriber.  Please follow-up with your primary care doctor in 1 to 2 weeks, and neurologist in 3 to 4 weeks.  Take care,   Increase activity slowly   Complete by: As directed      Allergies as of 12/30/2019      Reactions   Ivp Dye [iodinated Diagnostic Agents] Itching   Metrizamide Itching      Medication List    STOP taking these medications   polyethylene glycol 17 g packet Commonly known as: MIRALAX / GLYCOLAX   potassium chloride SA 20 MEQ tablet Commonly known as: KLOR-CON   senna 8.6 MG Tabs tablet Commonly known as: SENOKOT   tiZANidine 4 MG tablet Commonly known as: ZANAFLEX  TAKE these medications   acetaminophen 500 MG tablet Commonly known as: TYLENOL Take 2 tablets (1,000 mg total) by mouth every 8 (eight) hours.   albuterol 108 (90 Base) MCG/ACT inhaler Commonly known as: VENTOLIN HFA Inhale 2 puffs into the lungs every 4 (four) hours as needed for wheezing or shortness of breath.   allopurinol 300 MG tablet Commonly known as: ZYLOPRIM Take 300 mg by mouth daily.   apixaban 2.5 MG Tabs tablet Commonly known as: ELIQUIS Take 2.5 mg by mouth 2 (two) times daily.   atorvastatin 10 MG tablet Commonly known as: LIPITOR Take 10 mg by mouth daily at 6 PM.    calcium-vitamin D 500-200 MG-UNIT tablet Commonly known as: OSCAL WITH D Take 2 tablets by mouth daily with breakfast.   Creon 24000-76000 units Cpep Generic drug: Pancrelipase (Lip-Prot-Amyl) Take 1 capsule by mouth 3 (three) times daily.   esomeprazole 40 MG capsule Commonly known as: NEXIUM Take 40 mg by mouth 2 (two) times daily before a meal.   feeding supplement (ENSURE ENLIVE) Liqd Take 237 mLs by mouth 2 (two) times daily between meals.   folic acid 1 MG tablet Commonly known as: FOLVITE Take 1 mg by mouth daily.   furosemide 40 MG tablet Commonly known as: Lasix Take 1 tablet (40 mg total) by mouth daily as needed for fluid or edema (sob). Start taking on: January 02, 2020 What changed:   when to take this  reasons to take this  These instructions start on January 02, 2020. If you are unsure what to do until then, ask your doctor or other care provider.   lactulose 10 GM/15ML solution Commonly known as: CHRONULAC Take 30 mls (20 g) 2-3 times a day for a goal of 1-2 bowel movements a day.   metoprolol succinate 25 MG 24 hr tablet Commonly known as: TOPROL-XL Take 1 tablet (25 mg total) by mouth daily.   nystatin-triamcinolone ointment Commonly known as: MYCOLOG Apply 1 application topically 2 (two) times daily as needed for rash.   ondansetron 4 MG disintegrating tablet Commonly known as: ZOFRAN-ODT Take 4 mg by mouth every 8 (eight) hours as needed for nausea/vomiting.   Oxycodone HCl 20 MG Tabs Take 1 tablet by mouth every 6 (six) hours as needed (severe pain).   predniSONE 5 MG tablet Commonly known as: DELTASONE Take 1 tablet (5 mg total) by mouth daily with breakfast.   valproic acid 250 MG capsule Commonly known as: DEPAKENE Take 250 mg by mouth 3 (three) times daily.       Consultations:  Neurology  Palliative care  Procedures/Studies:  2D Echo on 12/25/19 1. Left ventricular ejection fraction, by visual estimation, is 60 to   65%. The left ventricle has normal function. There is moderately increased  left ventricular hypertrophy.  2. Definity contrast agent was given IV to delineate the left ventricular  endocardial borders.  3. Left ventricular diastolic parameters are consistent with Grade I  diastolic dysfunction (impaired relaxation).  4. The left ventricle has no regional wall motion abnormalities.  5. Global right ventricle has normal systolic function.The right  ventricular size is normal. No increase in right ventricular wall  thickness.  6. Left atrial size was normal.  7. Right atrial size was normal.  8. Mild mitral annular calcification.  9. Moderate calcification of the mitral valve leaflet(s).  10. Moderate thickening of the mitral valve leaflet(s).  11. The mitral valve is normal in structure. Trivial mitral valve  regurgitation. No evidence  of mitral stenosis.  12. The tricuspid valve is normal in structure.  13. The tricuspid valve is normal in structure. Tricuspid valve  regurgitation is not demonstrated.  14. The aortic valve is tricuspid. Aortic valve regurgitation is not  visualized. Mild aortic valve sclerosis without stenosis.  15. The pulmonic valve was normal in structure. Pulmonic valve  regurgitation is not visualized.  16. The inferior vena cava is normal in size with greater than 50%  respiratory variability, suggesting right atrial pressure of 3 mmHg.  17. The interatrial septum was not well visualized.    CT Head Wo Contrast  Result Date: 12/24/2019 CLINICAL DATA:  Confusion EXAM: CT HEAD WITHOUT CONTRAST TECHNIQUE: Contiguous axial images were obtained from the base of the skull through the vertex without intravenous contrast. COMPARISON:  10/10/2018 FINDINGS: Brain: Chronic atrophic changes are noted. 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 There is a new area of wedge like decreased attenuation identified in the posterior aspect of the right parietal lobe consistent  with subacute ischemia. No other findings to suggest acute infarct are noted. Scattered chronic white matter ischemic changes are seen. Vascular: No hyperdense vessel or unexpected calcification. Skull: Normal. Negative for fracture or focal lesion. Sinuses/Orbits: No acute finding. Other: None. IMPRESSION: Chronic atrophic and ischemic changes. New area of decreased attenuation in the posterior parietal lobe consistent with subacute ischemia. This would correspond with the patient's recent history. Electronically Signed   By: Alcide Clever M.D.   On: 12/24/2019 19:06   MR ANGIO HEAD WO CONTRAST  Result Date: 12/25/2019 CLINICAL DATA:  Stroke follow-up. Increased confusion, drooling, left hand numbness, and word-finding difficulty. EXAM: MRI HEAD WITHOUT CONTRAST MRA HEAD WITHOUT CONTRAST TECHNIQUE: Multiplanar, multiecho pulse sequences of the brain and surrounding structures were obtained without intravenous contrast. Angiographic images of the head were obtained using MRA technique without contrast. COMPARISON:  Head CT 12/24/2019, MRI 11/11/2018, and MRA 11/12/2018 FINDINGS: MRI HEAD FINDINGS Brain: There is a moderate-sized region of restricted diffusion in the posterior right MCA territory involving the temporoparietal junction consistent with an acute to early subacute infarct. There is a small amount of associated petechial hemorrhage. Chronic bilateral frontoparietal cortical infarcts are noted with associated chronic blood products. Patchy to confluent T2 hyperintensities elsewhere in the cerebral white matter bilaterally and in the pons are similar to the prior MRI and nonspecific but compatible with extensive chronic small vessel ischemic disease. Multiple chronic infarcts are again noted in the cerebellum bilaterally as well as in the thalami and basal ganglia. There is moderate cerebral atrophy. No mass, midline shift, or extra-axial fluid collection is identified. Vascular: Evaluated below. Skull  and upper cervical spine: No suspicious marrow lesion. Sinuses/Orbits: Unremarkable orbits. Paranasal sinuses and mastoid air cells are clear. Other: None. MRA HEAD FINDINGS The visualized distal right vertebral artery is patent and supplies the basilar. 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 A patent right PICA is partially visualized. Patent AICAs and SCAs are also seen bilaterally. The basilar artery is patent without evidence of significant stenosis. There is a fetal origin of both PCAs without evidence of significant proximal PCA stenosis. The internal carotid arteries are widely patent from skull base to carotid termini. ACAs and MCAs are patent without evidence of proximal branch occlusion or significant proximal stenosis. No aneurysm is identified. IMPRESSION: 1. Moderate-sized acute to early subacute posterior  right MCA infarct. 2. Extensive chronic small vessel ischemic disease with multiple chronic infarcts as above. 3. No acute large vessel occlusion or significant proximal intracranial stenosis. Electronically Signed   By: Sebastian Ache M.D.   On: 12/25/2019 12:11   MR BRAIN WO CONTRAST  Result Date: 12/25/2019 CLINICAL DATA:  Stroke follow-up. Increased confusion, drooling, left hand numbness, and word-finding difficulty. EXAM: MRI HEAD WITHOUT CONTRAST MRA HEAD WITHOUT CONTRAST TECHNIQUE: Multiplanar, multiecho pulse sequences of the brain and surrounding structures were obtained without intravenous contrast. Angiographic images of the head were obtained using MRA technique without contrast. COMPARISON:  Head CT 12/24/2019, MRI 11/11/2018, and MRA 11/12/2018 FINDINGS: MRI HEAD FINDINGS Brain: There is a moderate-sized region of restricted diffusion in the posterior right MCA territory involving the temporoparietal junction consistent with an acute to early  subacute infarct. There is a small amount of associated petechial hemorrhage. Chronic bilateral frontoparietal cortical infarcts are noted with associated chronic blood products. Patchy to confluent T2 hyperintensities elsewhere in the cerebral white matter bilaterally and in the pons are similar to the prior MRI and nonspecific but compatible with extensive chronic small vessel ischemic disease. Multiple chronic infarcts are again noted in the cerebellum bilaterally as well as in the thalami and basal ganglia. There is moderate cerebral atrophy. No mass, midline shift, or extra-axial fluid collection is identified. Vascular: Evaluated below. Skull and upper cervical spine: No suspicious marrow lesion. Sinuses/Orbits: Unremarkable orbits. Paranasal sinuses and mastoid air cells are clear. Other: None. MRA HEAD FINDINGS The visualized distal right vertebral artery is patent and supplies the basilar. 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 A patent right PICA is partially visualized. Patent AICAs and SCAs are also seen bilaterally. The basilar artery is patent without evidence of significant stenosis. There is a fetal origin of both PCAs without evidence of significant proximal PCA stenosis. The internal carotid arteries are widely patent from skull base to carotid termini. ACAs and MCAs are patent without evidence of proximal branch occlusion or significant proximal stenosis. No aneurysm is identified. IMPRESSION: 1. 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 2. Extensive chronic small vessel ischemic disease with multiple chronic infarcts as above. 3. No acute large vessel occlusion or significant proximal intracranial stenosis. Electronically Signed   By: Sebastian Ache M.D.   On: 12/25/2019 12:11   DG Chest Portable 1 View  Result Date:  12/24/2019 CLINICAL DATA:  Increased confusion EXAM: PORTABLE CHEST 1 VIEW COMPARISON:  01/15/2019 FINDINGS: Cardiac shadow is enlarged but stable. Postsurgical changes are noted in the cervicothoracic spine. The lungs are clear bilaterally. No focal infiltrate or sizable effusion is seen. Old rib fractures are noted on the right with healing. IMPRESSION: No acute abnormality noted. Electronically Signed   By: Alcide Clever M.D.   On: 12/24/2019 19:07   DG Abd Portable 1V  Result Date: 12/26/2019 CLINICAL DATA:  Abdominal pain. EXAM: PORTABLE ABDOMEN - 1 VIEW COMPARISON:  01/01/2019 FINDINGS: Nonobstructive bowel gas pattern. No supine evidence of pneumoperitoneum. No pneumatosis or portal venous gas. Post cholecystectomy. Limited visualization of the lower thorax is negative for focal airspace opacity. Atherosclerotic plaque within the abdominal aorta and pelvic vasculature. Post mid/lower thoracic paraspinal fusion, incompletely evaluated. No definite acute osseous abnormalities. IMPRESSION: Nonobstructive bowel gas pattern. Electronically Signed   By: Simonne Come M.D.   On: 12/26/2019  14:41   ECHOCARDIOGRAM COMPLETE  Result Date: 12/25/2019   ECHOCARDIOGRAM REPORT   Patient Name:   Amber [name] Date of Exam: 12/25/2019 Medical Rec #:  [id number]      Height:       60.0 in Accession #:    [id number]     Weight:       105.0 lb Date of Birth:  [birth year]      BSA:          1.42 m Patient Age:    89 years       BP:           121/111 mmHg Patient Gender: F              HR:           101 bpm. Exam Location:  Inpatient Procedure: 2D Echo, Cardiac Doppler, Color Doppler and Intracardiac            Opacification Agent Indications:    Stroke 434.91  History:        Patient has prior history of Echocardiogram examinations, most                 recent 01/14/2019. Stroke and Pulmonary HTN; Risk                 Factors:Hypertension, Diabetes and Former Smoker. PFO.  Sonographer:    Paulita Fujita RDCS Referring Phys:  [id number] STEPHEN A PETTY IMPRESSIONS  1. Left ventricular ejection fraction, by visual estimation, is 60 to 65%. The left ventricle has normal function. There is moderately increased left ventricular hypertrophy.  2. Definity contrast agent was given IV to delineate the left ventricular endocardial borders.  3. Left ventricular diastolic parameters are consistent with Grade I diastolic dysfunction (impaired relaxation).  4. The left ventricle has no regional wall motion abnormalities.  5. Global right ventricle has normal systolic function.The right ventricular size is normal. No increase in right ventricular wall thickness.  6. Left atrial size was normal.  7. Right atrial size was normal.  8. Mild mitral annular calcification.  9. Moderate calcification of the mitral valve leaflet(s). 10. Moderate thickening of the mitral valve leaflet(s). 11. The mitral valve is normal in structure. Trivial mitral valve regurgitation. No evidence of mitral stenosis. 12. The tricuspid valve is normal in structure. 13. The tricuspid valve is normal in structure. Tricuspid valve regurgitation is not demonstrated. 14. The aortic valve is tricuspid. Aortic valve regurgitation is not visualized. Mild aortic valve sclerosis without stenosis. 15. The pulmonic valve was normal in structure. Pulmonic valve regurgitation is not visualized. 16. The inferior vena cava is normal in size with greater than 50% respiratory variability, suggesting right atrial pressure of 3 mmHg. 17. The interatrial septum was not well visualized. FINDINGS  Left Ventricle: Left ventricular ejection fraction, by visual estimation, is 60 to 65%. The left ventricle has normal function. Definity contrast agent was given IV to delineate the left ventricular endocardial borders. The left ventricle has no regional wall motion abnormalities. There is moderately increased left ventricular hypertrophy. Left ventricular diastolic parameters are consistent with Grade I  diastolic dysfunction (impaired relaxation). Normal left atrial pressure. Right Ventricle: The right ventricular size is normal. No increase in right ventricular wall thickness. Global RV systolic function is has normal systolic function. Left Atrium: Left atrial size was normal in size. Right Atrium: Right atrial size was normal in size Pericardium: There is no evidence of pericardial effusion. Mitral Valve: The mitral valve is  normal in structure. There is moderate thickening of the mitral valve leaflet(s). There is moderate calcification of the mitral valve leaflet(s). Mild mitral annular calcification. Trivial mitral valve regurgitation. No evidence of mitral valve stenosis by observation. Tricuspid Valve: The tricuspid valve is normal in structure. Tricuspid valve regurgitation is not demonstrated. Aortic Valve: The aortic valve is tricuspid. Aortic valve regurgitation is not visualized. Mild aortic valve sclerosis is present, with no evidence of aortic valve stenosis. Pulmonic Valve: The pulmonic valve was normal in structure. Pulmonic valve regurgitation is not visualized. Pulmonic regurgitation is not visualized. Aorta: The aortic root, ascending aorta and aortic arch are all structurally normal, with no evidence of dilitation or obstruction. Venous: The inferior vena cava is normal in size with greater than 50% respiratory variability, suggesting right atrial pressure of 3 mmHg. IAS/Shunts: The interatrial septum was not well visualized. There is no evidence of a patent foramen ovale. No ventricular septal defect is seen or detected. There is no evidence of an atrial septal defect.  LEFT VENTRICLE PLAX 2D LVIDd:         3.00 cm  Diastology LVIDs:         2.20 cm  LV e' lateral:   3.97 cm/s LV PW:         1.30 cm  LV E/e' lateral: 13.0 LV IVS:        1.30 cm  LV e' medial:    2.92 cm/s LVOT diam:     2.00 cm  LV E/e' medial:  17.7 LV SV:         19 ml LV SV Index:   13.23 LVOT Area:     3.14 cm  RIGHT  VENTRICLE RV S prime:     9.65 cm/s TAPSE (M-mode): 1.3 cm LEFT ATRIUM             Index LA diam:        2.30 cm 1.62 cm/m LA Vol (A2C):   22.5 ml 15.85 ml/m LA Vol (A4C):   27.6 ml 19.44 ml/m LA Biplane Vol: 26.6 ml 18.74 ml/m  AORTIC VALVE LVOT Vmax:   84.20 cm/s LVOT Vmean:  54.000 cm/s LVOT VTI:    0.142 m  AORTA Ao Asc diam: 3.00 cm MITRAL VALVE MV Area (PHT): 5.13 cm             SHUNTS MV PHT:        42.92 msec           Systemic VTI:  0.14 m MV Decel Time: 148 msec             Systemic Diam: 2.00 cm MV E velocity: 51.80 cm/s 103 cm/s MV A velocity: 66.00 cm/s 70.3 cm/s MV E/A ratio:  0.78       1.5  Charlton Haws MD Electronically signed by Charlton Haws MD Signature Date/Time: 12/25/2019/10:26:14 AM    Final    VAS US CAROTID (at [facility] and [facility] only)  Result Date: 12/26/2019 Carotid Arterial Duplex Study Indications:       CVA. Risk Factors:      Hypertension, Diabetes, prior CVA. Limitations        Today's exam was limited due to the patient's inability or                    unwillingness to cooperate. Comparison Study:  no prior Performing Technologist: Blanch Media RVS  Examination Guidelines: A complete evaluation includes B-mode imaging, spectral Doppler, color Doppler, and power  Doppler as needed of all accessible portions of each vessel. Bilateral testing is considered an integral part of a complete examination. Limited examinations for reoccurring indications may be performed as noted.  Right Carotid Findings: +----------+--------+--------+--------+------------------+--------------+           PSV cm/sEDV cm/sStenosisPlaque DescriptionComments       +----------+--------+--------+--------+------------------+--------------+ CCA Prox  60      26              heterogenous                     +----------+--------+--------+--------+------------------+--------------+ CCA Distal60      23              heterogenous                      +----------+--------+--------+--------+------------------+--------------+ ICA Prox  56      22      1-39%   heterogenous                     +----------+--------+--------+--------+------------------+--------------+ ICA Distal57      26                                               +----------+--------+--------+--------+------------------+--------------+ ECA                                                 Not visualized +----------+--------+--------+--------+------------------+--------------+ +----------+--------+-------+--------+-------------------+           PSV cm/sEDV cmsDescribeArm Pressure (mmHG) +----------+--------+-------+--------+-------------------+ [id number]                                         +----------+--------+-------+--------+-------------------+ +---------+--------+--+--------+--+---------+ VertebralPSV cm/s59EDV cm/s24Antegrade +---------+--------+--+--------+--+---------+  Left Carotid Findings: +----------+--------+--------+--------+------------------+--------+           PSV cm/sEDV cm/sStenosisPlaque DescriptionComments +----------+--------+--------+--------+------------------+--------+ CCA Prox  58      20              heterogenous               +----------+--------+--------+--------+------------------+--------+ CCA Distal48      18              heterogenous               +----------+--------+--------+--------+------------------+--------+ ICA Prox  95      42      40-59%  heterogenous      tortuous +----------+--------+--------+--------+------------------+--------+ ICA Distal77      38                                         +----------+--------+--------+--------+------------------+--------+ ECA       33      14                                         +----------+--------+--------+--------+------------------+--------+ +----------+--------+--------+--------+-------------------+           PSV cm/sEDV  cm/sDescribeArm Pressure (mmHG) +----------+--------+--------+--------+-------------------+  [id number]                                          +----------+--------+--------+--------+-------------------+ +---------+--------+--------+--------------+ VertebralPSV cm/sEDV cm/sNot identified +---------+--------+--------+--------------+   Summary: Right Carotid: Velocities in the right ICA are consistent with a 1-39% stenosis. Left Carotid: Velocities in the left ICA are consistent with a 40-59% stenosis. Vertebrals: Right vertebral artery demonstrates antegrade flow. Left vertebral             artery was not visualized. *See table(s) above for measurements and observations.  Electronically signed by Delia Heady MD on 12/26/2019 at 12:32:27 PM.    Final    VAS [place] LOWER EXTREMITY VENOUS (DVT)  Result Date: 12/28/2019  Lower Venous DVTStudy Indications: Stroke.  Anticoagulation: Eliquis. Limitations: Poor ultrasound/tissue interface and Patient uncooperative. Comparison Study: Bilateral LEV 11-25-16. Performing Technologist: Kennedy Bucker ARDMS, RVT  Examination Guidelines: A complete evaluation includes B-mode imaging, spectral Doppler, color Doppler, and power Doppler as needed of all accessible portions of each vessel. Bilateral testing is considered an integral part of a complete examination. Limited examinations for reoccurring indications may be performed as noted. The reflux portion of the exam is performed with the patient in reverse Trendelenburg.  +---------+---------------+---------+-----------+----------+------------------+ RIGHT    CompressibilityPhasicitySpontaneityPropertiesThrombus Aging     +---------+---------------+---------+-----------+----------+------------------+ CFV      Full           Yes      Yes                                     +---------+---------------+---------+-----------+----------+------------------+ SFJ      Full                                                             +---------+---------------+---------+-----------+----------+------------------+ FV Prox  Full                                                            +---------+---------------+---------+-----------+----------+------------------+ FV Mid   Full                                                            +---------+---------------+---------+-----------+----------+------------------+ FV DistalFull                                                            +---------+---------------+---------+-----------+----------+------------------+ PFV      Full                                                            +---------+---------------+---------+-----------+----------+------------------+  POP      Full           Yes      Yes                                     +---------+---------------+---------+-----------+----------+------------------+ PTV      Full                                         Limited                                                                  visualization      +---------+---------------+---------+-----------+----------+------------------+ PERO     Full                                         Limited                                                                  visualization      +---------+---------------+---------+-----------+----------+------------------+   +---------+---------------+---------+-----------+----------+------------------+ LEFT     CompressibilityPhasicitySpontaneityPropertiesThrombus Aging     +---------+---------------+---------+-----------+----------+------------------+ CFV      Full           Yes      Yes                                     +---------+---------------+---------+-----------+----------+------------------+ SFJ      Full                                                            +---------+---------------+---------+-----------+----------+------------------+ FV  Prox  Full                                                            +---------+---------------+---------+-----------+----------+------------------+ FV Mid   Full                                                            +---------+---------------+---------+-----------+----------+------------------+ FV DistalFull                                                            +---------+---------------+---------+-----------+----------+------------------+  PFV      Full                                                            +---------+---------------+---------+-----------+----------+------------------+ POP      Full           Yes      Yes                                     +---------+---------------+---------+-----------+----------+------------------+ PTV      Full                                         Limited                                                                  visualization      +---------+---------------+---------+-----------+----------+------------------+ PERO     Full                                         Limited                                                                  visualization      +---------+---------------+---------+-----------+----------+------------------+   Left Technical Findings: Limited visualization of veins due to overlying shadow from calcified arteries. Poorly visualized bilateral calf veins.   Summary: BILATERAL: - No evidence of deep vein thrombosis seen in the lower extremities, bilaterally.  RIGHT: - No cystic structure found in the popliteal fossa.  LEFT: - No cystic structure found in the popliteal fossa.  *See table(s) above for measurements and observations. Electronically signed by Fabienne Bruns MD on 12/28/2019 at 11:30:54 AM.    Final    VAS [place] UPPER EXTREMITY VENOUS DUPLEX  Result Date: 12/28/2019 UPPER VENOUS STUDY  Indications: stroke Anticoagulation: Eliquis. Limitations: Patient  uncooperative. Comparison Study: Upper extremity venous duplex 10-27-16. Performing Technologist: Kennedy Bucker RDMS, RVT  Examination Guidelines: A complete evaluation includes B-mode imaging, spectral Doppler, color Doppler, and power Doppler as needed of all accessible portions of each vessel. Bilateral testing is considered an integral part of a complete examination. Limited examinations for reoccurring indications may be performed as noted.  Right Findings: +----------+------------+---------+-----------+----------+--------------+ RIGHT     CompressiblePhasicitySpontaneousProperties   Summary     +----------+------------+---------+-----------+----------+--------------+ IJV           Full       Yes       Yes                             +----------+------------+---------+-----------+----------+--------------+  Subclavian    Full       Yes       Yes                             +----------+------------+---------+-----------+----------+--------------+ Axillary      Full       Yes       Yes                             +----------+------------+---------+-----------+----------+--------------+ Brachial      Full       Yes       Yes                             +----------+------------+---------+-----------+----------+--------------+ Radial                                              Not visualized +----------+------------+---------+-----------+----------+--------------+ Ulnar                                               Not visualized +----------+------------+---------+-----------+----------+--------------+ Cephalic      Full                                                 +----------+------------+---------+-----------+----------+--------------+ Basilic       Full                                                 +----------+------------+---------+-----------+----------+--------------+  Left Findings:  +----------+------------+---------+-----------+----------+--------------+ LEFT      CompressiblePhasicitySpontaneousProperties   Summary     +----------+------------+---------+-----------+----------+--------------+ IJV           Full       Yes       Yes                             +----------+------------+---------+-----------+----------+--------------+ Subclavian    Full       Yes       Yes                             +----------+------------+---------+-----------+----------+--------------+ Axillary      Full       Yes       Yes                             +----------+------------+---------+-----------+----------+--------------+ Brachial      Full       Yes       Yes                             +----------+------------+---------+-----------+----------+--------------+ Radial  Not visualized +----------+------------+---------+-----------+----------+--------------+ Ulnar                                               Not visualized +----------+------------+---------+-----------+----------+--------------+ Cephalic      Full                                                 +----------+------------+---------+-----------+----------+--------------+ Basilic       Full                                                 +----------+------------+---------+-----------+----------+--------------+ Limited exam, bilateral forearm veins not visualized due to uncooperative patient.  Summary: No evidence of deep vein or superficial vein thrombosis involving the right and left upper extremities.  *See table(s) above for measurements and observations.  Diagnosing physician: Fabienne Bruns MD Electronically signed by Fabienne Bruns MD on 12/28/2019 at 11:32:26 AM.    Final    VAS [place] TRANSCRANIAL DOPPLER  Result Date: 12/26/2019  Transcranial Doppler Indications: Stroke. Limitations: Uncooperative patient Limitations for diagnostic  windows: Unable to insonate right transtemporal window. Unable to insonate left transtemporal window. Comparison Study: no prior Performing Technologist: Blanch Media RVS  Examination Guidelines: A complete evaluation includes B-mode imaging, spectral Doppler, color Doppler, and power Doppler as needed of all accessible portions of each vessel. Bilateral testing is considered an integral part of a complete examination. Limited examinations for reoccurring indications may be performed as noted.  +---------+-------------+----------+-----------+-------+ RIGHT TCDRight VM (cm)Depth (cm)PulsatilityComment +---------+-------------+----------+-----------+-------+ Opthalmic    10.00                 1.26            +---------+-------------+----------+-----------+-------+  +---------+------------+----------+-----------+-------+ LEFT TCD Left VM (cm)Depth (cm)PulsatilityComment +---------+------------+----------+-----------+-------+ Opthalmic   11.00                 1.39            +---------+------------+----------+-----------+-------+  Summary:  Suboptimal highly limites study due to patient`s lack of cooperation and poor windows throughout. antegrade flow in both opthalic arteries . *See table(s) above for TCD measurements and observations.  Diagnosing physician: Delia Heady MD Electronically signed by Delia Heady MD on 12/26/2019 at 12:33:49 PM.    Final        Discharge Exam: Vitals:   12/30/19 0853 12/30/19 1219  BP: (!) 176/111 (!) 160/101  Pulse: (!) 101 84  Resp: 17 20  Temp:  98.4 F (36.9 C)  SpO2: 100% 100%    GENERAL: Frail elderly female.  No apparent distress.  Nontoxic. HEENT: MMM.  Vision and hearing grossly intact.  NECK: Supple.  No apparent JVD.  RESP:  No IWOB.  Fair aeration bilaterally. CVS:  RRR. Heart sounds normal.  ABD/GI/GU: Bowel sounds present. Soft. Non tender.  MSK/EXT:  Moves extremities. No apparent deformity or edema.  SKIN: no apparent skin  lesion or wound NEURO: Awake, alert and oriented self, place, year and person.  some aphasia and generalized weakness. PSYCH: Calm. Normal affect.   The results of significant diagnostics from this hospitalization (including imaging, microbiology, ancillary and laboratory)  are listed below for reference.     Microbiology: Recent Results (from the past 240 hour(s))  Respiratory Panel by RT PCR (Flu A&B, Covid) - Nasopharyngeal Swab     Status: None   Collection Time: 12/24/19  7:00 PM   Specimen: Nasopharyngeal Swab  Result Value Ref Range Status   SARS Coronavirus 2 by RT PCR NEGATIVE NEGATIVE Final    Comment: (NOTE) SARS-CoV-2 target nucleic acids are NOT DETECTED. The SARS-CoV-2 RNA is generally detectable in upper respiratoy specimens during the acute phase of infection. The lowest concentration of SARS-CoV-2 viral copies this assay can detect is 131 copies/mL. A negative result does not preclude SARS-Cov-2 infection and should not be used as the sole basis for treatment or other patient management decisions. A negative result may occur with  improper specimen collection/handling, submission of specimen other than nasopharyngeal swab, presence of viral mutation(s) within the areas targeted by this assay, and inadequate number of viral copies (<131 copies/mL). A negative result must be combined with clinical observations, patient history, and epidemiological information. The expected result is Negative. Fact Sheet for Patients:  https://www.moore.com/ Fact Sheet for Healthcare Providers:  https://www.young.biz/ This test is not yet ap proved or cleared by the [place] FDA and  has been authorized for detection and/or diagnosis of SARS-CoV-2 by FDA under an Emergency Use Authorization (EUA). This EUA will remain  in effect (meaning this test can be used) for the duration of the COVID-19 declaration under Section 564(b)(1) of the Act,  21 U.S.C. section 360bbb-3(b)(1), unless the authorization is terminated or revoked sooner.    Influenza A by PCR NEGATIVE NEGATIVE Final   Influenza B by PCR NEGATIVE NEGATIVE Final    Comment: (NOTE) The Xpert Xpress SARS-CoV-2/FLU/RSV assay is intended as an aid in  the diagnosis of influenza from Nasopharyngeal swab specimens and  should not be used as a sole basis for treatment. Nasal washings and  aspirates are unacceptable for Xpert Xpress SARS-CoV-2/FLU/RSV  testing. Fact Sheet for Patients: https://www.moore.com/ Fact Sheet for Healthcare Providers: https://www.young.biz/ This test is not yet approved or cleared by the [place] FDA and  has been authorized for detection and/or diagnosis of SARS-CoV-2 by  FDA under an Emergency Use Authorization (EUA). This EUA will remain  in effect (meaning this test can be used) for the duration of the  Covid-19 declaration under Section 564(b)(1) of the Act, 21  U.S.C. section 360bbb-3(b)(1), unless the authorization is  terminated or revoked. Performed at [facility], [address] [place], [place] [address]   Culture, Urine     Status: Abnormal   Collection Time: 12/25/19  5:10 AM   Specimen: Urine, Random  Result Value Ref Range Status   Specimen Description URINE, RANDOM  Final   Special Requests   Final    culture prior urine sample Performed at [facility], [address], [place], [place] [address]    Culture MULTIPLE SPECIES PRESENT, SUGGEST RECOLLECTION (A)  Final   Report Status 12/26/2019 FINAL  Final  Culture, Urine     Status: Abnormal   Collection Time: 12/26/19  1:07 PM   Specimen: Urine, Catheterized  Result Value Ref Range Status   Specimen Description URINE, CATHETERIZED  Final   Special Requests   Final    NONE Performed at [facility], [address], [place], [place] [address]    Culture MULTIPLE SPECIES PRESENT, SUGGEST RECOLLECTION (A)   Final   Report Status 12/27/2019 FINAL  Final     Labs: BNP (last 3 results) Recent Labs    01/05/19 0619 01/06/19 0846  BNP 984.3* 1,244.3*   Basic Metabolic Panel: Recent Labs  Lab 12/25/19 0359 12/27/19 1105 12/28/19 0508 12/29/19 0148 12/30/19 0229  NA 141 138 141 142 139  K 3.8 4.3 4.1 5.0 5.0  CL 114* 109 112* 112* 111  CO2 20* 16* 21* 16* 16*  GLUCOSE 87 74 86 158* 95  BUN 9 13 12 13 12   CREATININE 0.78 1.17* 1.47* 1.43* 1.35*  CALCIUM 7.9* 7.7* 8.0* 7.9* 8.2*  MG  --  1.3* 2.9* 2.6* 2.4  PHOS  --  4.1 4.6 5.0* 3.7   Liver Function Tests: Recent Labs  Lab 12/24/19 1845 12/28/19 0508  AST 27 16  ALT 20 12  ALKPHOS 102 74  BILITOT 0.5 0.3  PROT 6.0* 4.2*  ALBUMIN 2.3* 1.5*   No results for input(s): LIPASE, AMYLASE in the last 168 hours. Recent Labs  Lab 12/27/19 1105 12/28/19 0508 12/30/19 0229  AMMONIA 76* 33 49*   CBC: Recent Labs  Lab 12/24/19 1845 12/24/19 1845 12/25/19 0359 12/27/19 1105 12/28/19 0508 12/29/19 0148 12/30/19 0229  WBC 13.1*   < > 11.1* 5.7 4.4 4.1 6.4  NEUTROABS 5.6  --   --   --   --   --   --   HGB 9.5*   < > 8.3* 8.1* 7.5* 8.1* 8.4*  HCT 28.5*   < > 24.3* 23.5* 22.3* 24.3* 25.2*  MCV 79.6*   < > 78.1* 78.1* 79.1* 79.2* 78.8*  PLT 185   < > 163 140* 125* 116* 131*   < > = values in this interval not displayed.   Cardiac Enzymes: No results for input(s): CKTOTAL, CKMB, CKMBINDEX, TROPONINI in the last 168 hours. BNP: Invalid input(s): POCBNP CBG: No results for input(s): GLUCAP in the last 168 hours. D-Dimer No results for input(s): DDIMER in the last 72 hours. Hgb A1c No results for input(s): HGBA1C in the last 72 hours. Lipid Profile No results for input(s): CHOL, HDL, LDLCALC, TRIG, CHOLHDL, LDLDIRECT in the last 72 hours. Thyroid function studies No results for input(s): TSH, T4TOTAL, T3FREE, THYROIDAB in the last 72 hours.  Invalid input(s): FREET3 Anemia work up Recent Labs    12/27/19 1543   VITAMINB12 883  FOLATE 39.5  FERRITIN 606*  TIBC NOT CALCULATED  IRON 47  RETICCTPCT 1.8   Urinalysis    Component Value Date/Time   COLORURINE YELLOW 12/25/2019 0510   APPEARANCEUR HAZY (A) 12/25/2019 0510   LABSPEC 1.015 12/25/2019 0510   PHURINE 6.0 12/25/2019 0510   GLUCOSEU NEGATIVE 12/25/2019 0510   HGBUR NEGATIVE 12/25/2019 0510   BILIRUBINUR NEGATIVE 12/25/2019 0510   KETONESUR NEGATIVE 12/25/2019 0510   PROTEINUR NEGATIVE 12/25/2019 0510   UROBILINOGEN 1.0 07/27/2015 1052   NITRITE NEGATIVE 12/25/2019 0510   LEUKOCYTESUR MODERATE (A) 12/25/2019 0510   Sepsis Labs Invalid input(s): PROCALCITONIN,  WBC,  LACTICIDVEN   Time coordinating discharge: 35 minutes  SIGNED:  Almon Hercules, MD  [facility] 12/30/2019, 2:24 PM  If 7PM-7AM, please contact night-coverage [email or web address] Password [email or web address]

## 2019-12-30 NOTE — TOC Transition Note (Addendum)
Transition of Care [facility]) - CM/SW Discharge Note   Patient Details  Name: Amber [name] MRN: [id number] Date of Birth: [birth year]  Transition of Care [facility] CM/SW Contact:  Kermit Balo, RN Phone Number: 12/30/2019, 12:18 PM   Clinical Narrative:    Pt discharging home with [facility] services. Pt last used [facility] services and daughter requested to use them again. CM called Drew with [facility] and am awaiting a response.  Daughter states they have all needed DME in the home.  Daughter wants to provide transport home with the assistance of her brother.   Addendum: palliative care consulted TOC for home palliative care. CM spoke with patients daughter and she is in agreement and asked to use [facility]. CM called [place] with [facility] and gave them the referral.   Addendum: [facility] unable to accept the referral. CM reached out to [facility] with [facility] and they accepted the referral.   1515: Addendum: Family has now decided to have [facility] take patient home. CM has called and arranged [facility]. They are going to be very late for pick up and family made aware.   Final next level of care: Home w Home Health Services Barriers to Discharge: No Barriers Identified   Patient Goals and CMS Choice Patient states their goals for this hospitalization and ongoing recovery are:: to get back home CMS Medicare.gov Compare Post Acute Care list provided to:: Patient Represenative (must comment) Choice offered to / list presented to : Adult Children  Discharge Placement                       Discharge Plan and Services                          HH Arranged: PT HH Agency: [facility] Date [facility] Agency Contacted: 12/30/19   Representative spoke with at [facility] Agency: Kenard Gower  Social Determinants of Health (SDOH) Interventions     Readmission Risk Interventions No flowsheet data found.

## 2019-12-31 ENCOUNTER — Telehealth: Payer: Self-pay

## 2019-12-31 NOTE — Telephone Encounter (Signed)
Phone call placed to PCP office to make them aware that Palliative Care referral was received while patient was in the hospital. Spoke with [place]. Will proceed with referral.

## 2019-12-31 NOTE — Progress Notes (Signed)
[facility] came and transported patient home via stretcher at about [phone]

## 2020-01-02 ENCOUNTER — Telehealth: Payer: Self-pay | Admitting: Internal Medicine

## 2020-01-02 NOTE — Telephone Encounter (Signed)
Phone call placed to patient to offer to schedule a visit with [facility]. Phone rang, with no answer I left a voicemail for call back. 

## 2020-01-09 ENCOUNTER — Telehealth: Payer: Self-pay | Admitting: Internal Medicine

## 2020-01-09 NOTE — Telephone Encounter (Signed)
Spoke with patient's granddaughter, Meekah Math, and she was in agreement with Palliative services.  I have scheduled a Telephone Consult for 01/19/20 @ 11 AM.

## 2020-01-09 NOTE — Telephone Encounter (Signed)
Spoke with patient's husband Nadine Counts and explained that I was calling from Palliative Care and he said that patient was back at the ED at [facility] and I told him I would notify our Liaison Team and I would f/u with him after she has been discharged, he was in agreement with this.

## 2020-01-19 ENCOUNTER — Other Ambulatory Visit: Payer: Self-pay

## 2020-01-19 ENCOUNTER — Other Ambulatory Visit: Payer: Medicaid Other | Admitting: Internal Medicine

## 2020-01-19 DIAGNOSIS — Z515 Encounter for palliative care: Secondary | ICD-10-CM

## 2020-01-19 DIAGNOSIS — I639 Cerebral infarction, unspecified: Secondary | ICD-10-CM

## 2020-01-19 NOTE — Progress Notes (Signed)
Therapist, nutritional Palliative Care Consult Note Telephone: [phone]  Fax: [phone]  PATIENT NAME: Amber [name] DOB: 05/14/1956 MRN: [id number]  PRIMARY CARE PROVIDER:   [name], MD  REFERRING PROVIDER:  [name], MD [address] [place],  [place] [address]  RESPONSIBLE PARTY:   Self, son([name] [phone]) and daughter(Shantea)     RECOMMENDATIONS and PLAN:  Palliative Care Encounter  Z51.5   1.  Advance Care Planning:   Explanation of Palliative Care services and goals of care which are to improve strength, resume ambulation and out of bed status.  Son would also like to request increased number of CAP hours for patient due to work schedule and patient needs. Son will attempt to identify name and contact number of nurse case manager for additional communication. Advanced directives were reviewed with patient and son.  She expressed that she would like to have CPR attempted, intubation, full scope of treatment with IV and antibiotics if needed.  She is unsure of desire for feeding tube placement.  Pt and family will discuss feeding tube.  MOST form will be completed and provided to patient for her records.   2. Weakness:  Related to debilitation and recent R CVA.  Continue home PT, heart healthy meals and proper hydration.  Monitor for additional progress.  3.  Metabolic  Encephalopathy: Improving post hospital discharge however, not currently taking Lactulose.  Instructed on the purpose of Lactulose and encouraged its use as prescribed. Patient is not self administering medications.  Continue to monitor degree of confusion and level of consciousness.  4.  Chronic back pain:  Controlled with use of Oxycodone 10mg  q 6 hrs as needed.  Continue administration of medications by responsible family member.  Due to the COVID-19 crisis, this visit occurred telephonically from my office and was initiated and consented by the patient and or family  member.   I spent 60 minutes providing this consultation,  from 1115 to 1215. More than 50% of the time in this consultation was spent coordinating communication.   HISTORY OF PRESENT ILLNESS:  Amber [name] is a 64 y.o. year old female with multiple medical problems including rheumatiod arthritis with chronic back pain, T2DM, CHF and pancreatic insufficiency. She was admitted to the hospital from 2/3-2/9 due to a R CVA, metabolic encephalapothy(elevation of ammonia level and home opiod use) and a UTI.  Recommendations were given for d/c to SNF but patient desired to return home.  She requires total assistance with performance of ADLs by her family and aid 2 hrs per day.  She is able to feed self and transfers with assistance.  Son reports that she is not able to ambulate thus far.  She also receives assistance from home physical therapy.  Son expressed that pre hospitalization confusion is improving. She has not been taking Lactulose.  "She has been having at least 2 bowel movements per day.  She's not constipated" Palliative Care was asked to help address goals of care.   CODE STATUS: FULL CODE  PPS: 30%  HOSPICE ELIGIBILITY/DIAGNOSIS: TBD  PAST MEDICAL HISTORY:  Past Medical History:  Diagnosis Date  . ACS (acute coronary syndrome) (HCC) 06/12/2015  . Acute on chronic blood loss anemia 11/25/2017  . Arthritis   . Asthma   . 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 Chronic pain   . Constipation   . Diabetes mellitus without complication (HCC)   . Duodenitis   . Embolic stroke (HCC)   .  Falls frequently   . Focal seizure ([place])   . GERD (gastroesophageal reflux disease)   . Hypertension   . Impacted fracture 02/15/2017   right glenoid  . Pneumonia 10/2015  . Seizures ([place])    last seizure March 2015  . Small bowel obstruction ([place]) 11/25/2017     ALLERGIES:  Allergies  Allergen Reactions  . Ivp Dye [Iodinated Diagnostic Agents] Itching  . Metrizamide  Itching     PERTINENT MEDICATIONS:  Outpatient Encounter Medications as of 01/19/2020  Medication Sig  . acetaminophen (TYLENOL) 500 MG tablet Take 2 tablets (1,000 mg total) by mouth every 8 (eight) hours.  Marland Kitchen albuterol (PROVENTIL HFA;VENTOLIN HFA) 108 (90 Base) MCG/ACT inhaler Inhale 2 puffs into the lungs every 4 (four) hours as needed for wheezing or shortness of breath.  . allopurinol (ZYLOPRIM) 300 MG tablet Take 300 mg by mouth daily.  Marland Kitchen apixaban (ELIQUIS) 2.5 MG TABS tablet Take 2.5 mg by mouth 2 (two) times daily.  Marland Kitchen atorvastatin (LIPITOR) 10 MG tablet Take 10 mg by mouth daily at 6 PM.   . calcium-vitamin D (OSCAL WITH D) 500-200 MG-UNIT tablet Take 2 tablets by mouth daily with breakfast.  . CREON 24000-76000 units CPEP Take 1 capsule by mouth 3 (three) times daily.   Marland Kitchen esomeprazole (NEXIUM) 40 MG capsule Take 40 mg by mouth 2 (two) times daily before a meal.   . feeding supplement, ENSURE ENLIVE, (ENSURE ENLIVE) LIQD Take 237 mLs by mouth 2 (two) times daily between meals.  . folic acid (FOLVITE) 1 MG tablet Take 1 mg by mouth daily.  . furosemide (LASIX) 40 MG tablet Take 1 tablet (40 mg total) by mouth daily as needed for fluid or edema (sob).  . lactulose (CHRONULAC) 10 GM/15ML solution Take 30 mls (20 g) 2-3 times a day for a goal of 1-2 bowel movements a day.  . metoprolol succinate (TOPROL-XL) 25 MG 24 hr tablet Take 1 tablet (25 mg total) by mouth daily.  Marland Kitchen nystatin-triamcinolone ointment (MYCOLOG) Apply 1 application topically 2 (two) times daily as needed for rash.  . ondansetron (ZOFRAN-ODT) 4 MG disintegrating tablet Take 4 mg by mouth every 8 (eight) hours as needed for nausea/vomiting.  . Oxycodone HCl 20 MG TABS Take 1 tablet by mouth every 6 (six) hours as needed (severe pain).   . predniSONE (DELTASONE) 5 MG tablet Take 1 tablet (5 mg total) by mouth daily with breakfast.  . valproic acid (DEPAKENE) 250 MG capsule Take 250 mg by mouth 3 (three) times daily.    No  facility-administered encounter medications on file as of 01/19/2020.    PHYSICAL EXAM:   General: NAD Cardiovascular: unable to assess Pulmonary: speech is not forceds Neurological: Alert and oriented to person and place.  Gonzella Lex, NP-C

## 2020-02-09 IMAGING — DX DG CHEST 1V PORT
1 series · 1 of 1 positions shown · non-contrast
Comparison: 01/15/2019

CLINICAL DATA: Increased confusion

EXAM:
PORTABLE CHEST 1 VIEW

[chest ap]
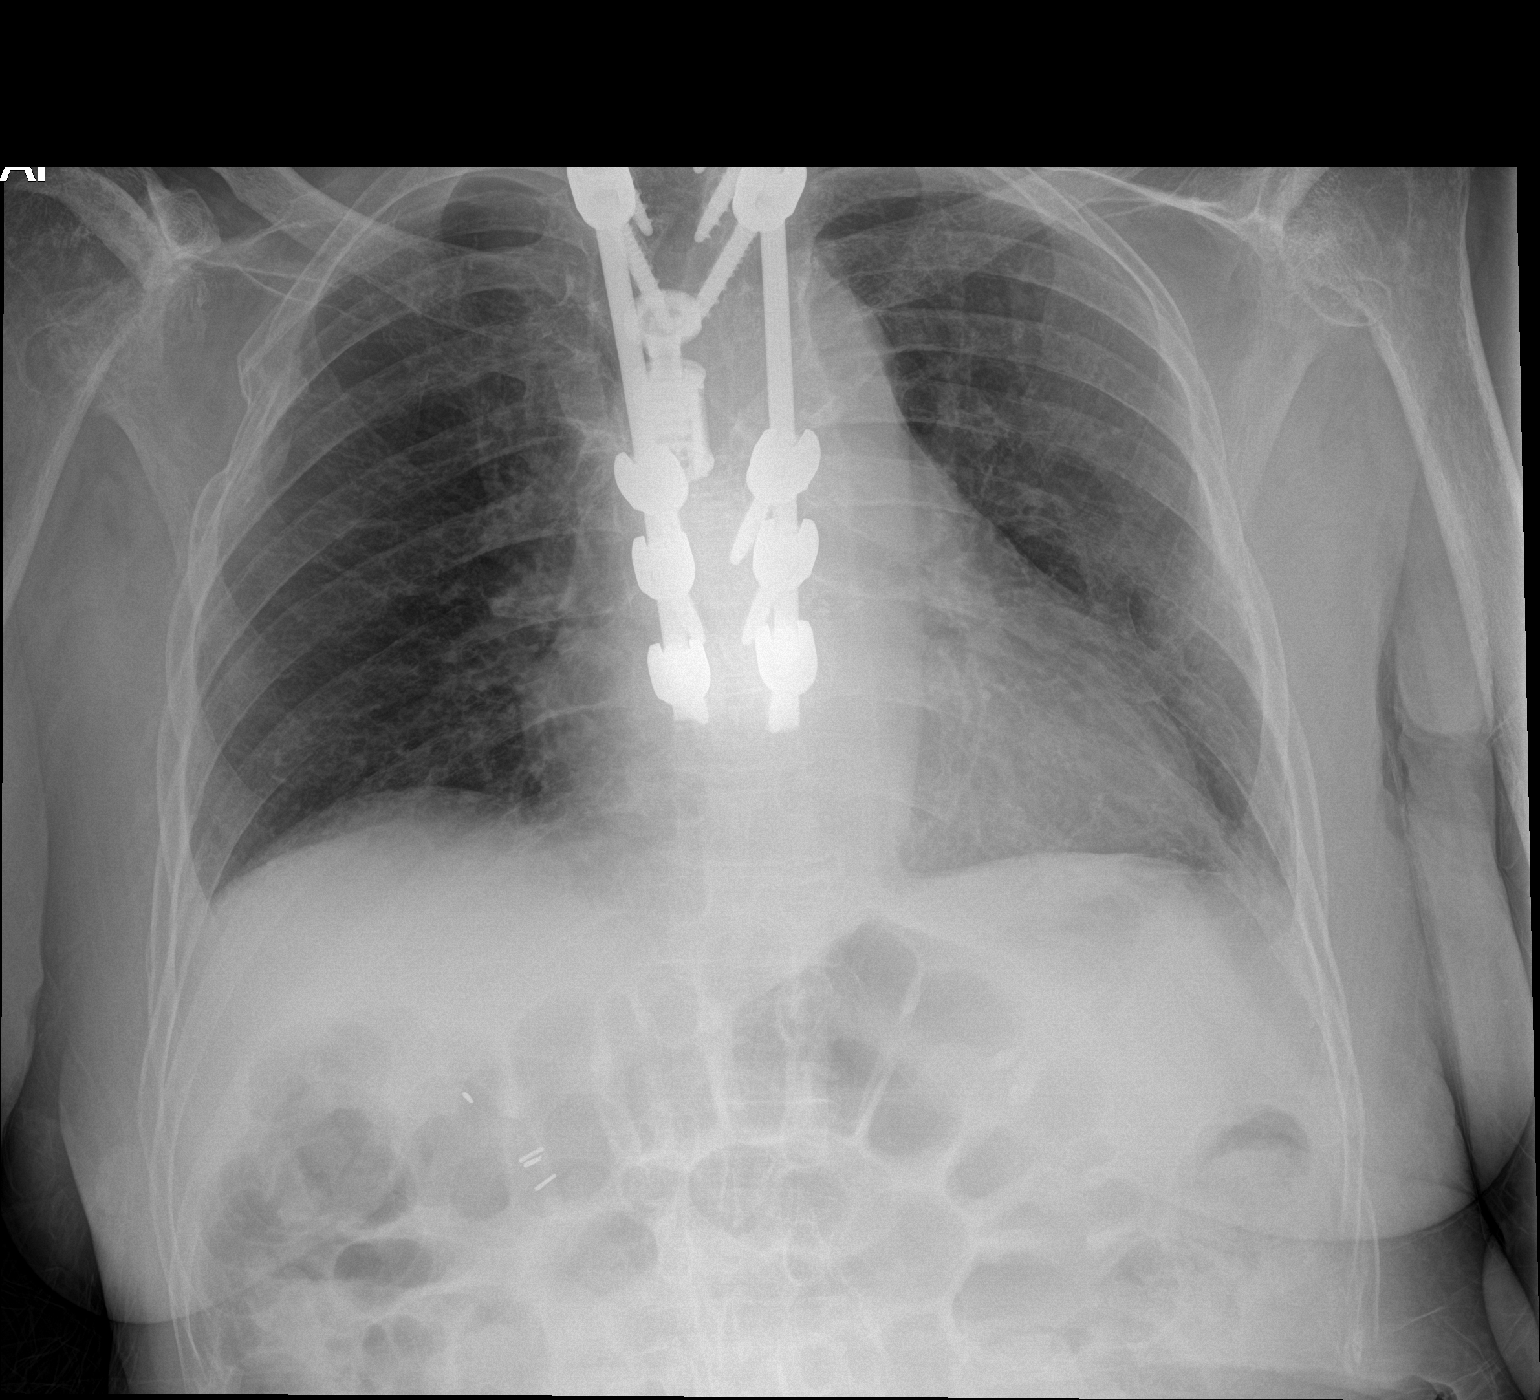

[1 of 1 positions shown; findings below may reference images not displayed]

FINDINGS: Cardiac shadow is enlarged but stable. Postsurgical changes are
noted in the cervicothoracic spine. The lungs are clear bilaterally.
No focal infiltrate or sizable effusion is seen. Old rib fractures
are noted on the right with healing.
IMPRESSION: No acute abnormality noted.

## 2020-02-09 IMAGING — CT CT HEAD W/O CM
3 series · 15 of 46 positions shown, 18 images · non-contrast
Comparison: 10/10/2018

CLINICAL DATA: Confusion

EXAM:
CT HEAD WITHOUT CONTRAST
TECHNIQUE: Contiguous axial images were obtained from the base of the skull
through the vertex without intravenous contrast.

[Series 2: head wo · axial · 0.39mm/px · z∈[+1183,+1303]mm · 9 of 29 slices shown, 12 images]
[im 3/29  brain]
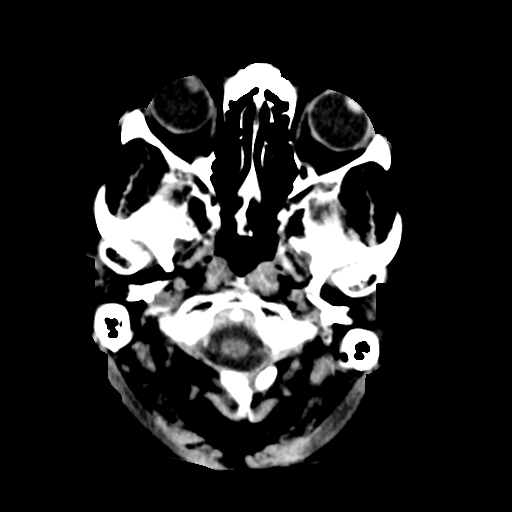
[im 3/29  bone]
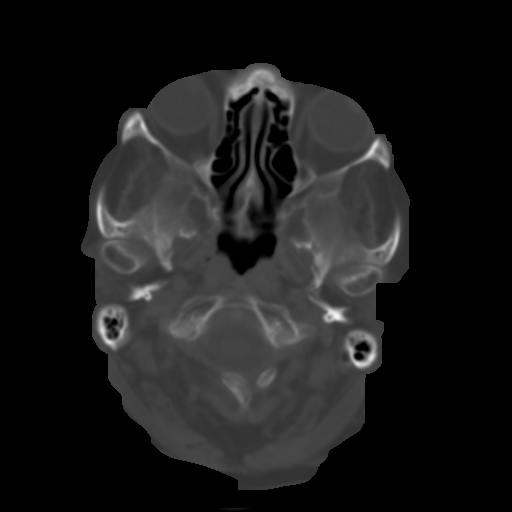
[im 6/29  brain]
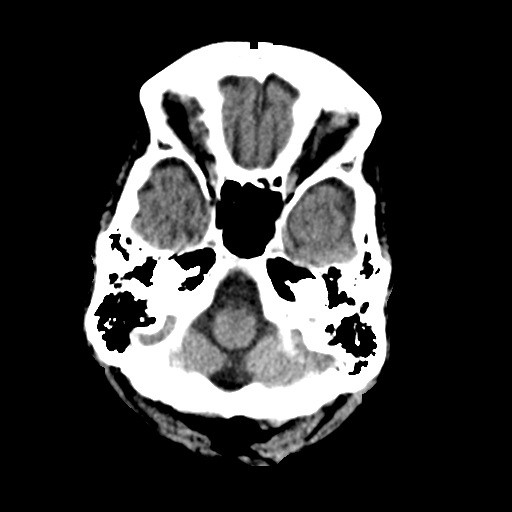
[im 9/29  brain]
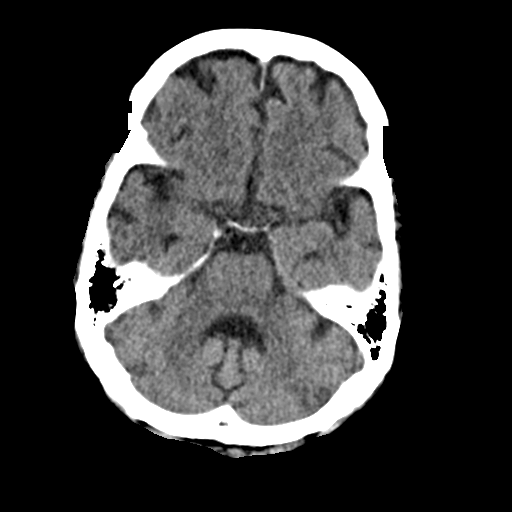
[im 12/29  brain]
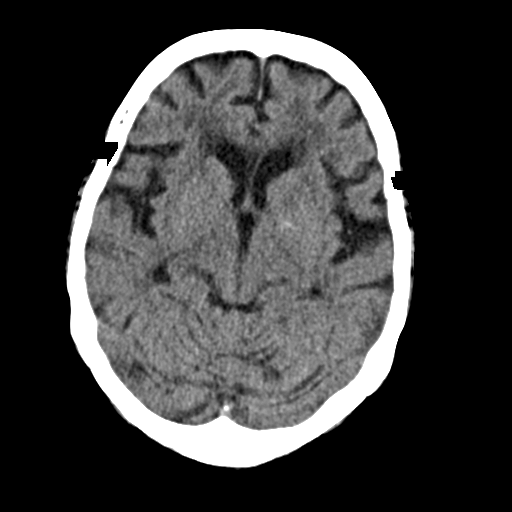
[im 15/29  brain]
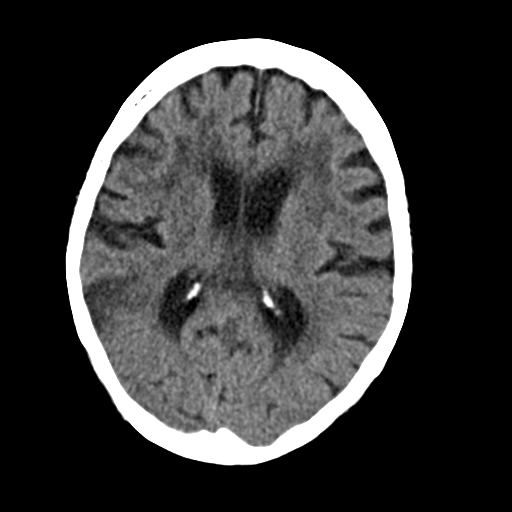
[im 15/29  bone]
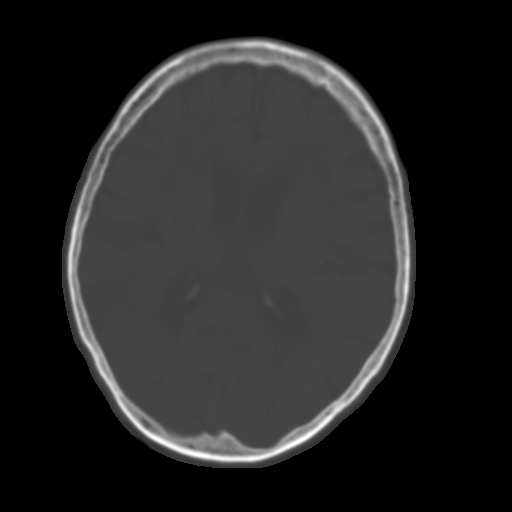
[im 18/29  brain]
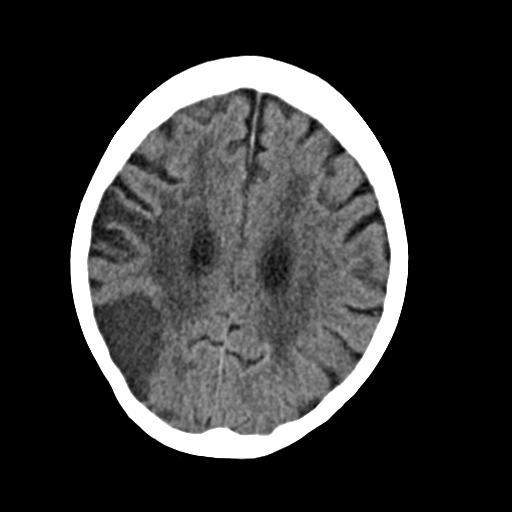
[im 21/29  brain]
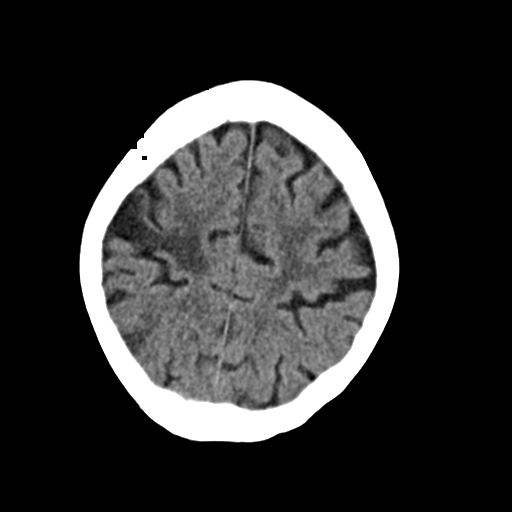
[im 24/29  brain]
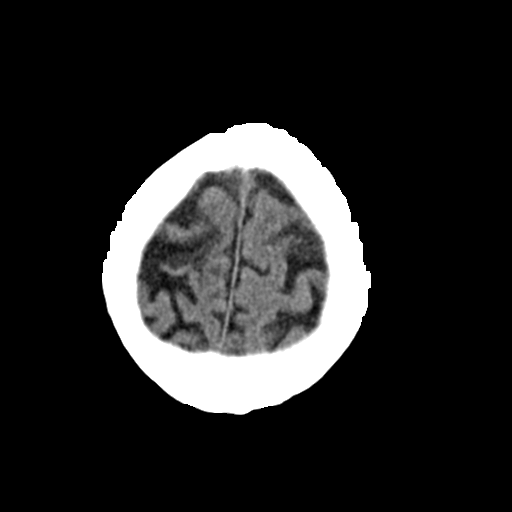
[im 27/29  brain]
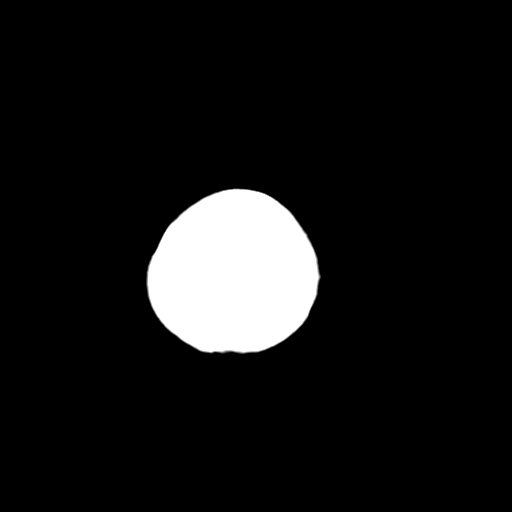
[im 27/29  bone]
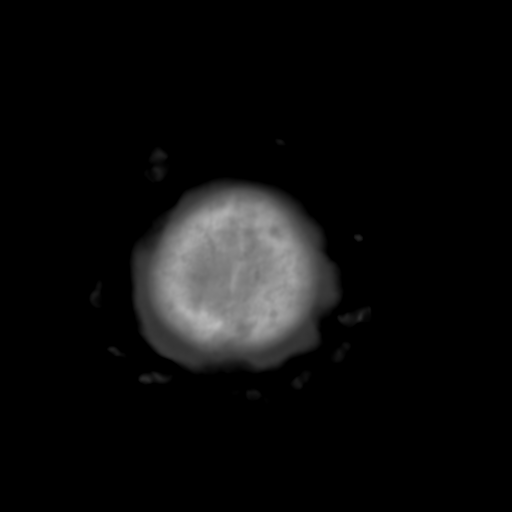

[Series 4: coronal soft · coronal · 0.30mm/px · 3 of 67 slices shown]
[im 23/67  brain]
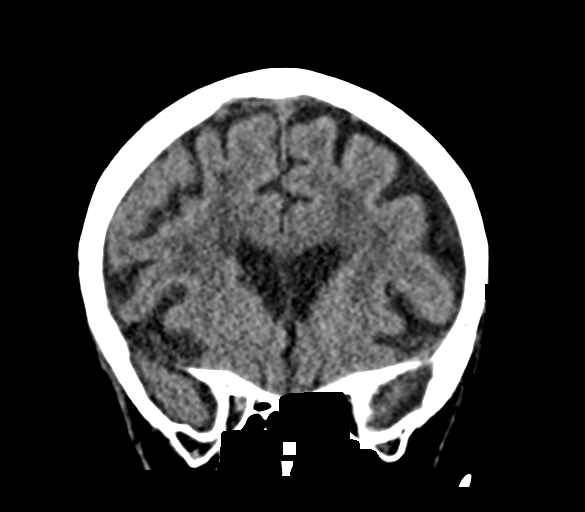
[im 30/67  brain]
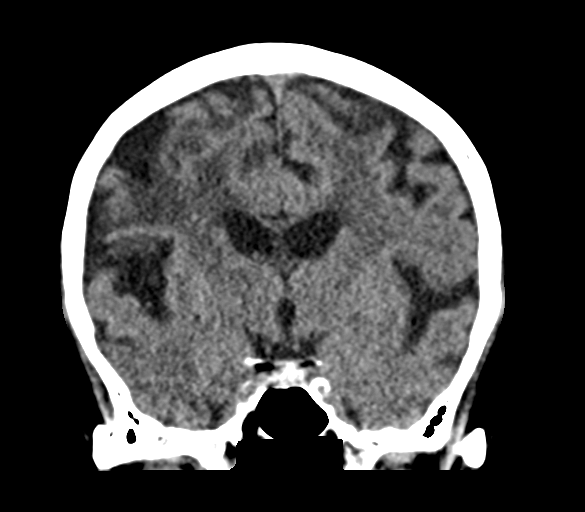
[im 37/67  brain]
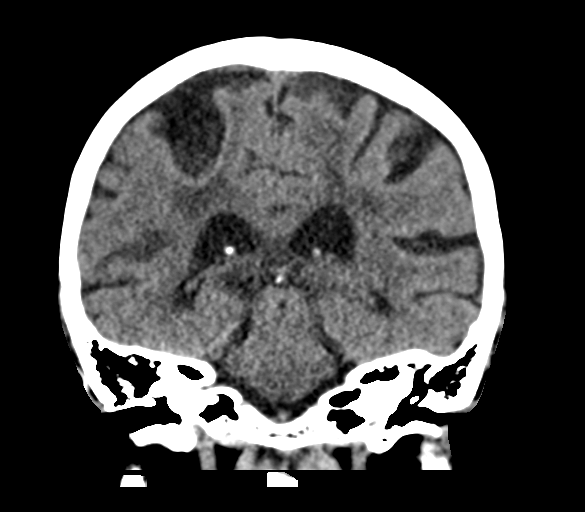

[Series 5: sag soft · sagittal · 0.28mm/px · 3 of 54 slices shown]
[im 18/54  brain]
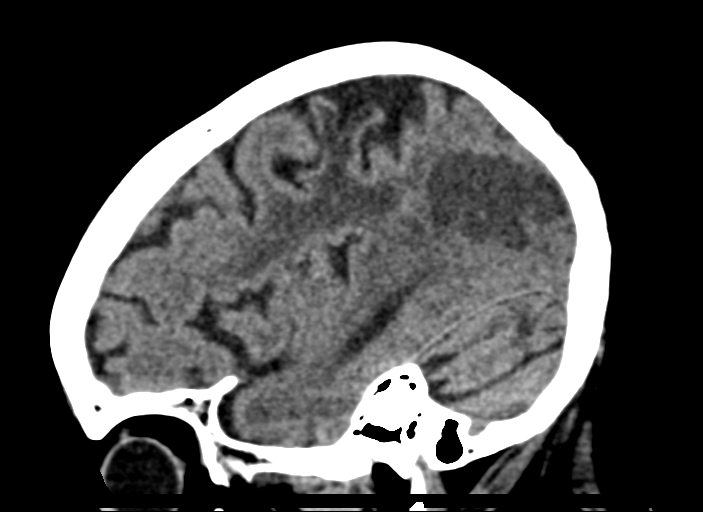
[im 27/54  brain]
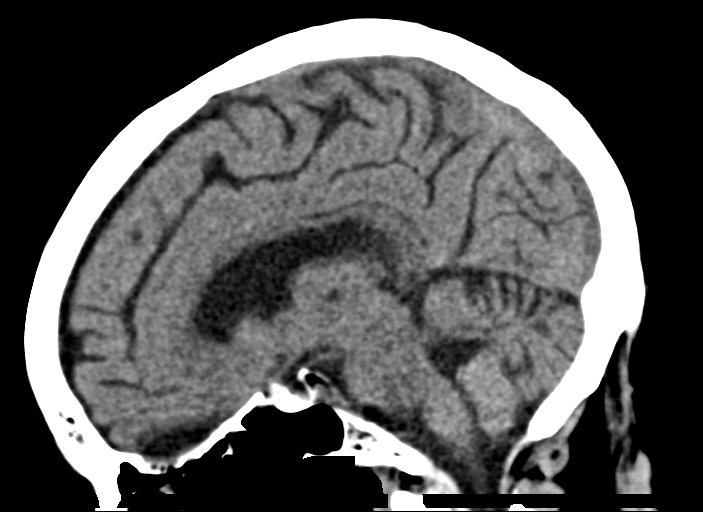
[im 36/54  brain]
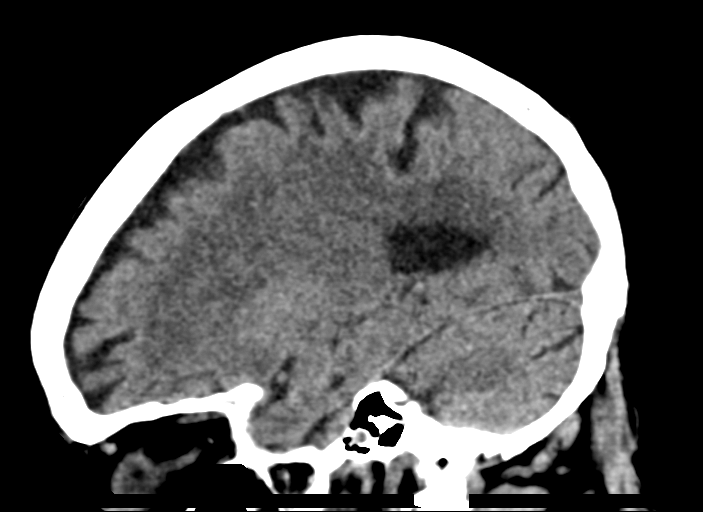

[15 of 46 positions shown; findings below may reference images not displayed]

FINDINGS: Brain: Chronic atrophic changes are noted. Prior right MCA infarct
is identified. There is a new area of wedge like decreased
attenuation identified in the posterior aspect of the right parietal
lobe consistent with subacute ischemia. No other findings to suggest
acute infarct are noted. Scattered chronic white matter ischemic
changes are seen.

Vascular: No hyperdense vessel or unexpected calcification.

Skull: Normal. Negative for fracture or focal lesion.

Sinuses/Orbits: No acute finding.

Other: None.
IMPRESSION: Chronic atrophic and ischemic changes.

New area of decreased attenuation in the posterior parietal lobe
consistent with subacute ischemia. This would correspond with the
patient's recent history.

## 2020-02-10 IMAGING — MR MR HEAD W/O CM
7 of 14 series · 17 of 48 positions shown · non-contrast
Comparison: Head CT 12/24/2019, MRI 11/11/2018, and MRA 11/12/2018

CLINICAL DATA: Stroke follow-up. Increased confusion, drooling,
left hand numbness, and word-finding difficulty.

EXAM:
MRI HEAD WITHOUT CONTRAST
MRA HEAD WITHOUT CONTRAST
TECHNIQUE: Multiplanar, multiecho pulse sequences of the brain and surrounding
structures were obtained without intravenous contrast. Angiographic
images of the head were obtained using MRA technique without
contrast.

[Series 2: DWI · axial · 3.0mm · 0.94mm/px · z∈[-124,+4]mm · 5 of 92 slices shown (1 of 2)]
[im 1/92]
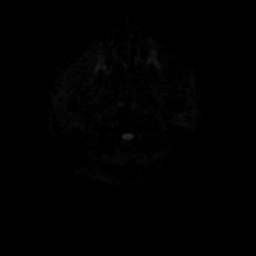
[im 23/92]
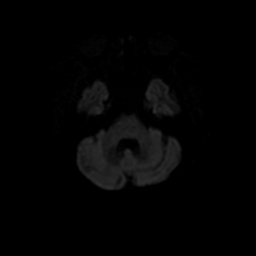
[im 46/92]
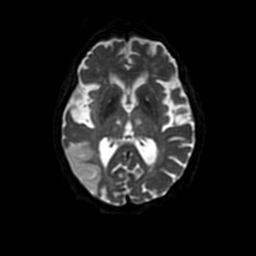
[im 69/92]
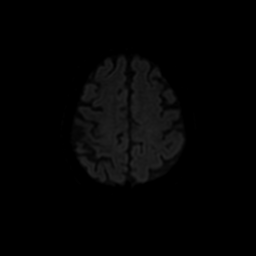
[im 92/92]
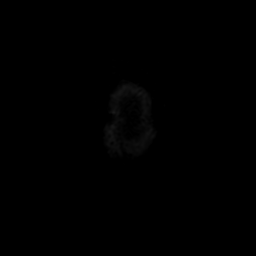

[Series 3: DWI · coronal · 4.0mm · 0.94mm/px · 3 of 64 slices shown (2 of 2)]
[im 1/64]
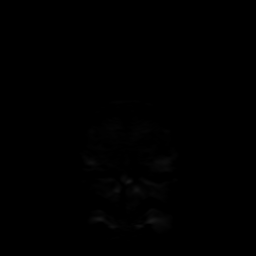
[im 32/64]
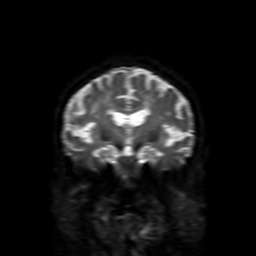
[im 64/64]
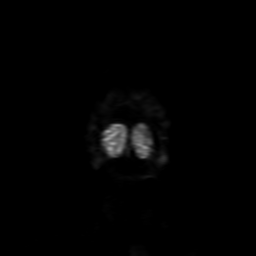

[Series 5: FLAIR · sagittal · 5.0mm · 0.23mm/px · 1 of 23 slices shown (1 of 2)]
[im 1/23]
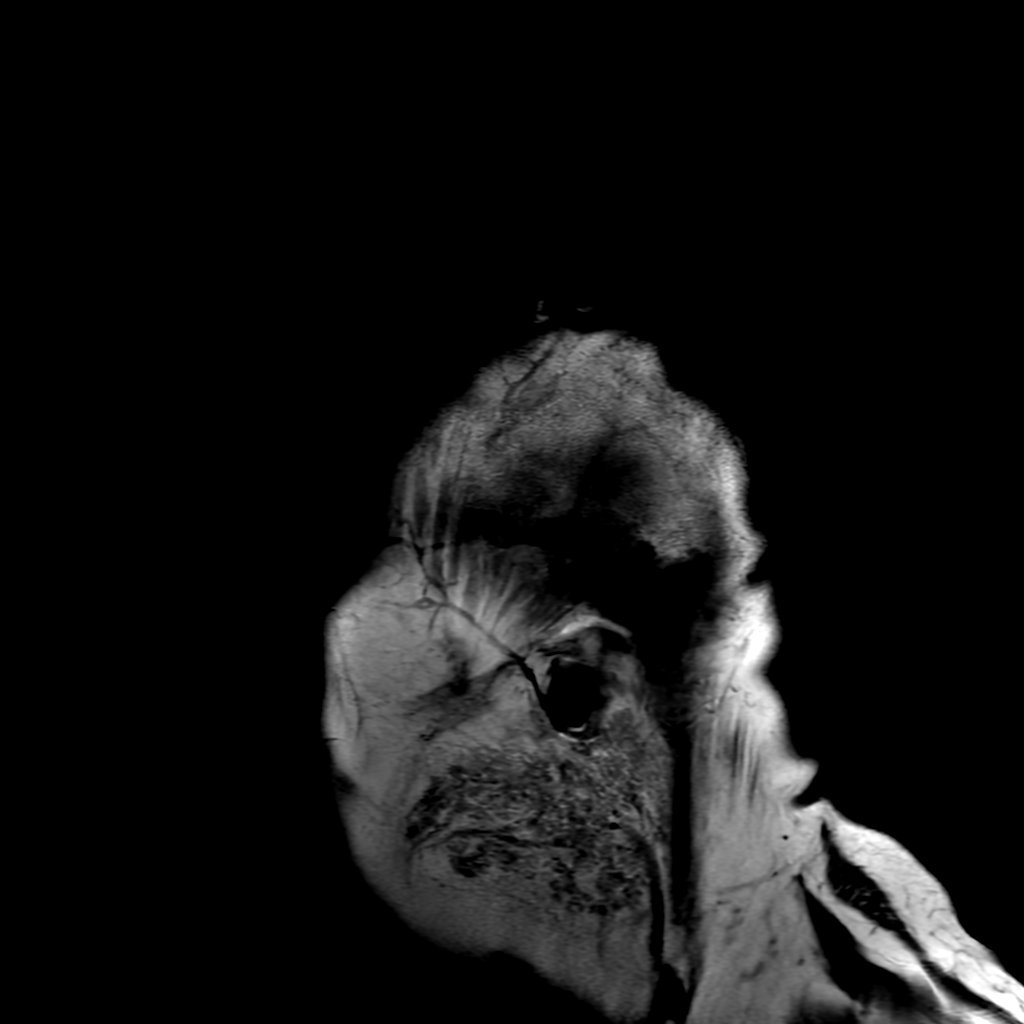

[Series 6: T2 · axial · 5.0mm · 0.23mm/px · z∈[-123,+9]mm · 2 of 24 slices shown]
[im 1/24]
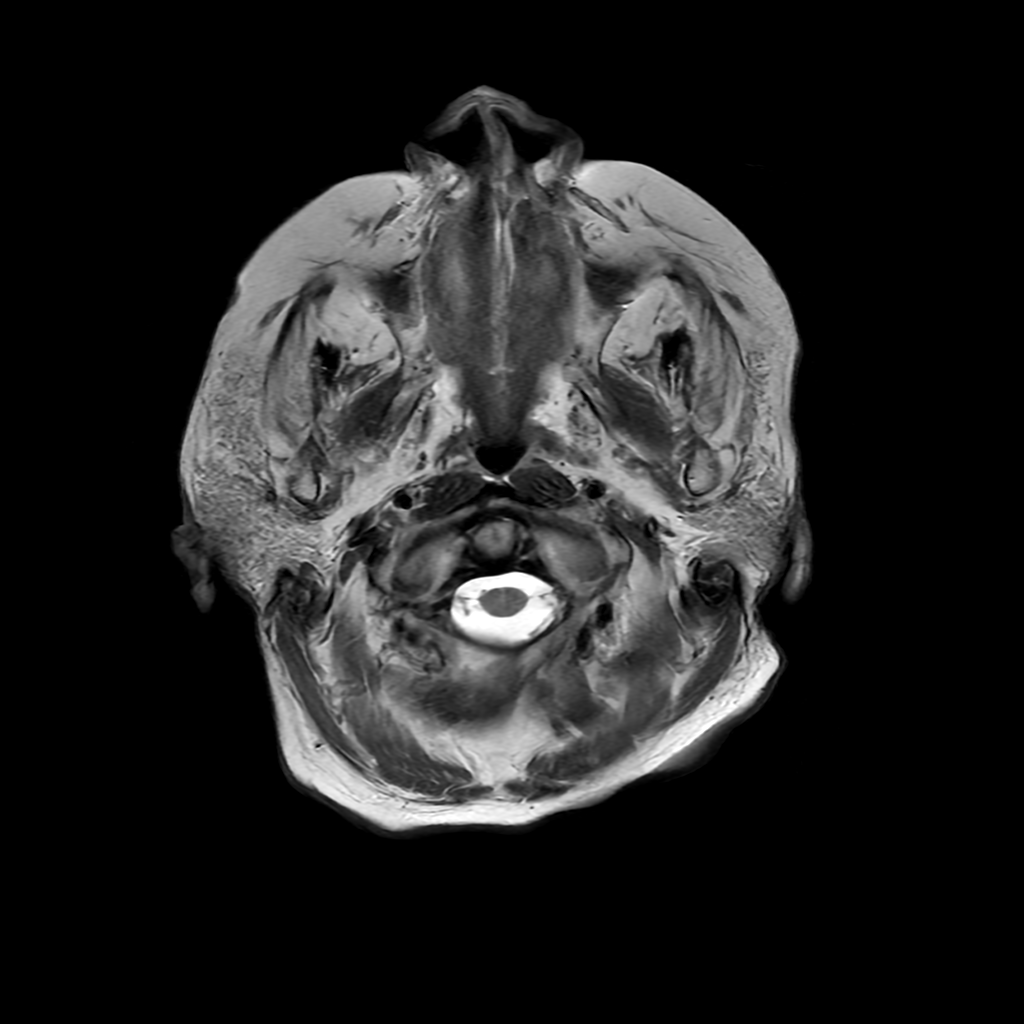
[im 24/24]
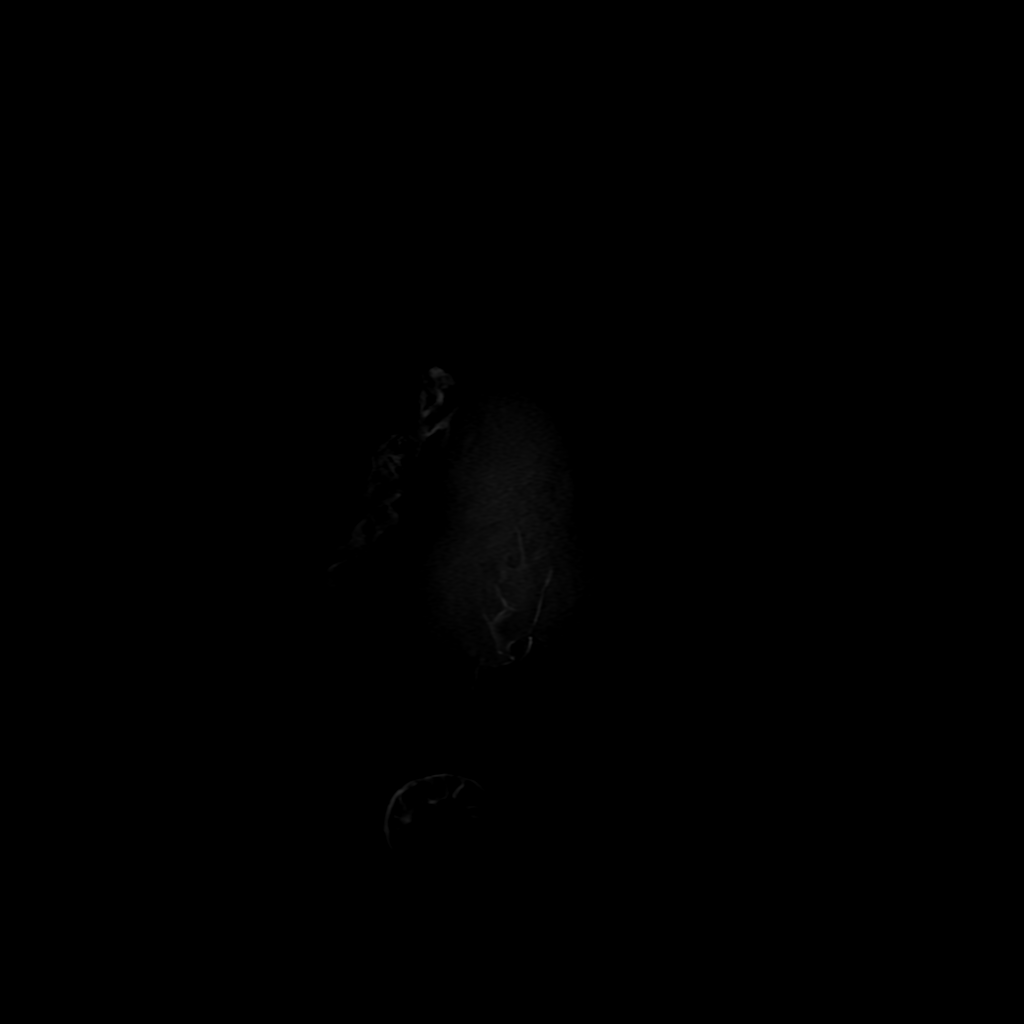

[Series 7: FLAIR · axial · 3.0mm · 0.41mm/px · 1 of 23 slices shown (2 of 2)]
[im 1/23]
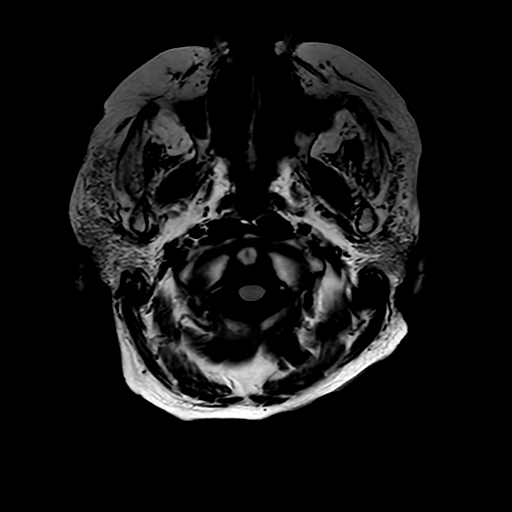

[Series 250: ADC · axial · 3.0mm · 0.94mm/px · z∈[-124,+4]mm · 3 of 46 slices shown (1 of 2)]
[im 1/46]
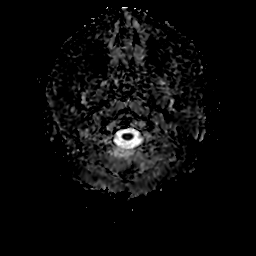
[im 23/46]
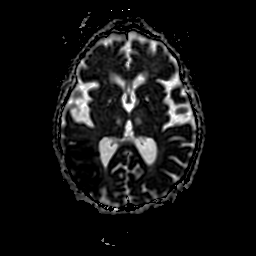
[im 46/46]
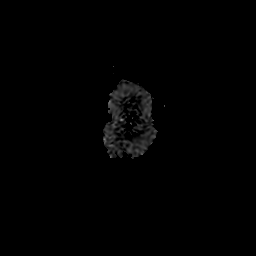

[Series 350: ADC · coronal · 4.0mm · 0.94mm/px · 2 of 32 slices shown (2 of 2)]
[im 1/32]
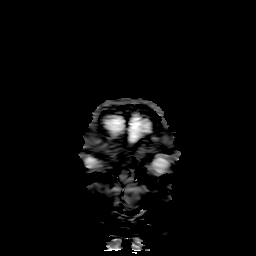
[im 32/32]
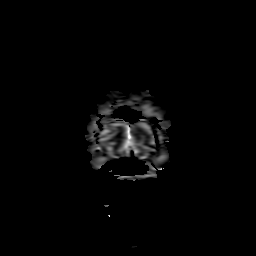

[17 of 48 positions shown; findings below may reference images not displayed]

FINDINGS: MRI HEAD FINDINGS

Brain: There is a moderate-sized region of restricted diffusion in
the posterior right MCA territory involving the temporoparietal
junction consistent with an acute to early subacute infarct. There
is a small amount of associated petechial hemorrhage. Chronic
bilateral frontoparietal cortical infarcts are noted with associated
chronic blood products. Patchy to confluent T2 hyperintensities
elsewhere in the cerebral white matter bilaterally and in the pons
are similar to the prior MRI and nonspecific but compatible with
extensive chronic small vessel ischemic disease. Multiple chronic
infarcts are again noted in the cerebellum bilaterally as well as in
the thalami and basal ganglia. There is moderate cerebral atrophy.
No mass, midline shift, or extra-axial fluid collection is
identified.

Vascular: Evaluated below.

Skull and upper cervical spine: No suspicious marrow lesion.

Sinuses/Orbits: Unremarkable orbits. Paranasal sinuses and mastoid
air cells are clear.

Other: None.

MRA HEAD FINDINGS

The visualized distal right vertebral artery is patent and supplies
the basilar. As seen on the prior head and neck MRA, the left
vertebral artery is hypoplastic and may be chronically occluded
distal to the PICA origin which was not included on today's study. A
patent right PICA is partially visualized. Patent AICAs and SCAs are
also seen bilaterally. The basilar artery is patent without evidence
of significant stenosis. There is a fetal origin of both PCAs
without evidence of significant proximal PCA stenosis.

The internal carotid arteries are widely patent from skull base to
carotid termini. ACAs and MCAs are patent without evidence of
proximal branch occlusion or significant proximal stenosis. No
aneurysm is identified.
IMPRESSION: 1. Moderate-sized acute to early subacute posterior right MCA
infarct.
2. Extensive chronic small vessel ischemic disease with multiple
chronic infarcts as above.
3. No acute large vessel occlusion or significant proximal
intracranial stenosis.

## 2020-02-10 IMAGING — MR MR MRA HEAD W/O CM
7 of 12 series · 18 of 48 positions shown · non-contrast
Comparison: Head CT 12/24/2019, MRI 11/11/2018, and MRA 11/12/2018

CLINICAL DATA: Stroke follow-up. Increased confusion, drooling,
left hand numbness, and word-finding difficulty.

EXAM:
MRI HEAD WITHOUT CONTRAST
MRA HEAD WITHOUT CONTRAST
TECHNIQUE: Multiplanar, multiecho pulse sequences of the brain and surrounding
structures were obtained without intravenous contrast. Angiographic
images of the head were obtained using MRA technique without
contrast.

[Series 2: DWI · axial · 3.0mm · 0.94mm/px · z∈[-124,+4]mm · 5 of 92 slices shown (1 of 2)]
[im 1/92]
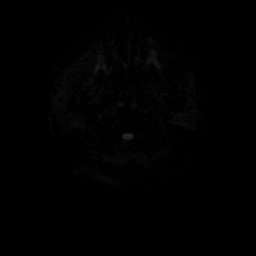
[im 23/92]
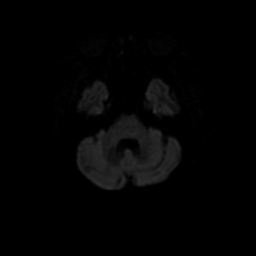
[im 46/92]
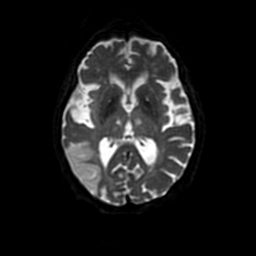
[im 69/92]
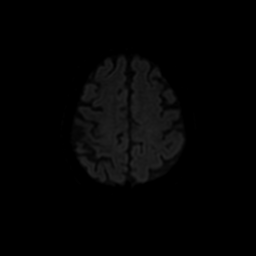
[im 92/92]
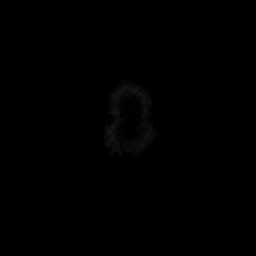

[Series 3: DWI · coronal · 4.0mm · 0.94mm/px · 3 of 64 slices shown (2 of 2)]
[im 1/64]
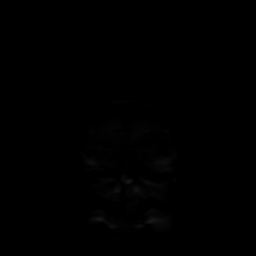
[im 32/64]
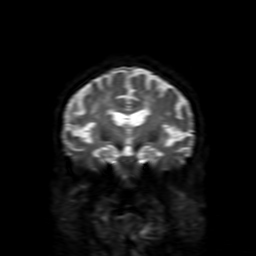
[im 64/64]
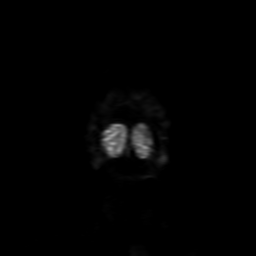

[Series 5: FLAIR · sagittal · 5.0mm · 0.23mm/px · 2 of 23 slices shown (1 of 2)]
[im 1/23]
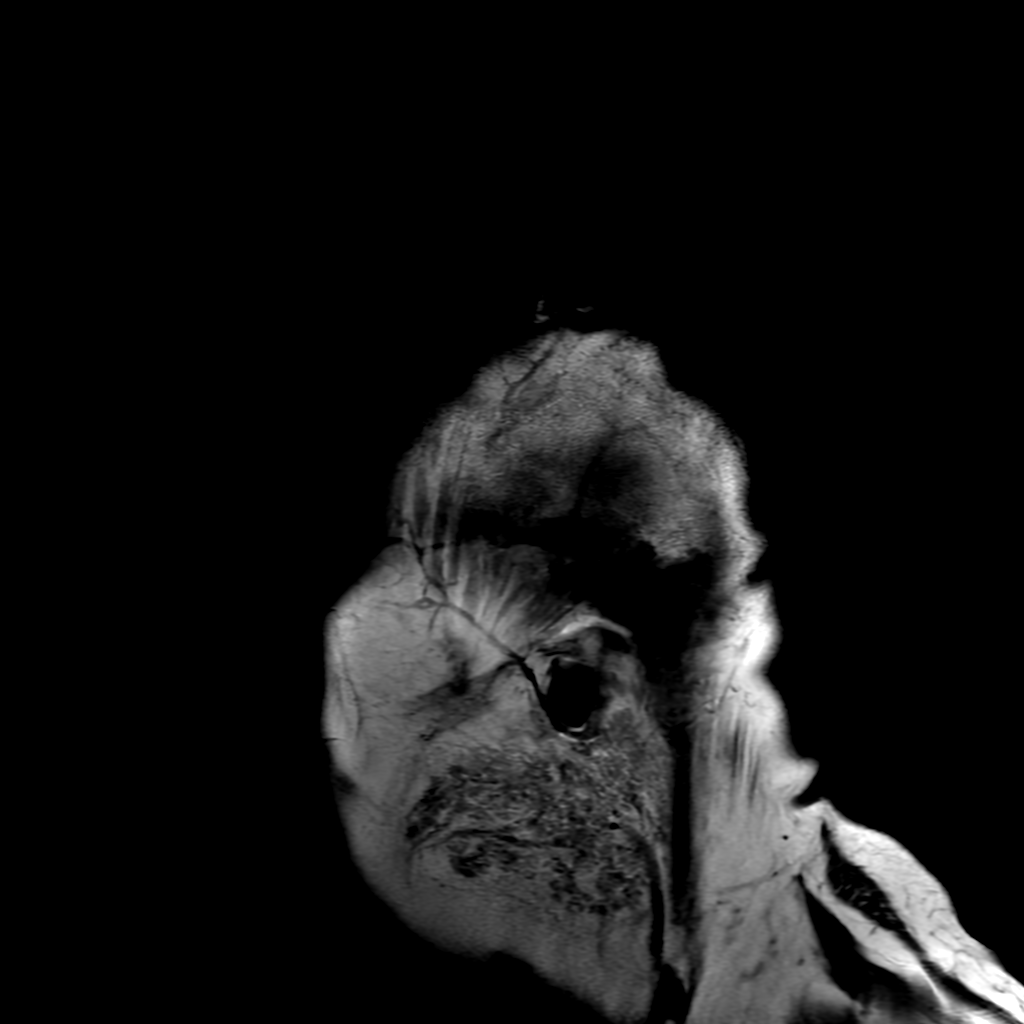
[im 23/23]
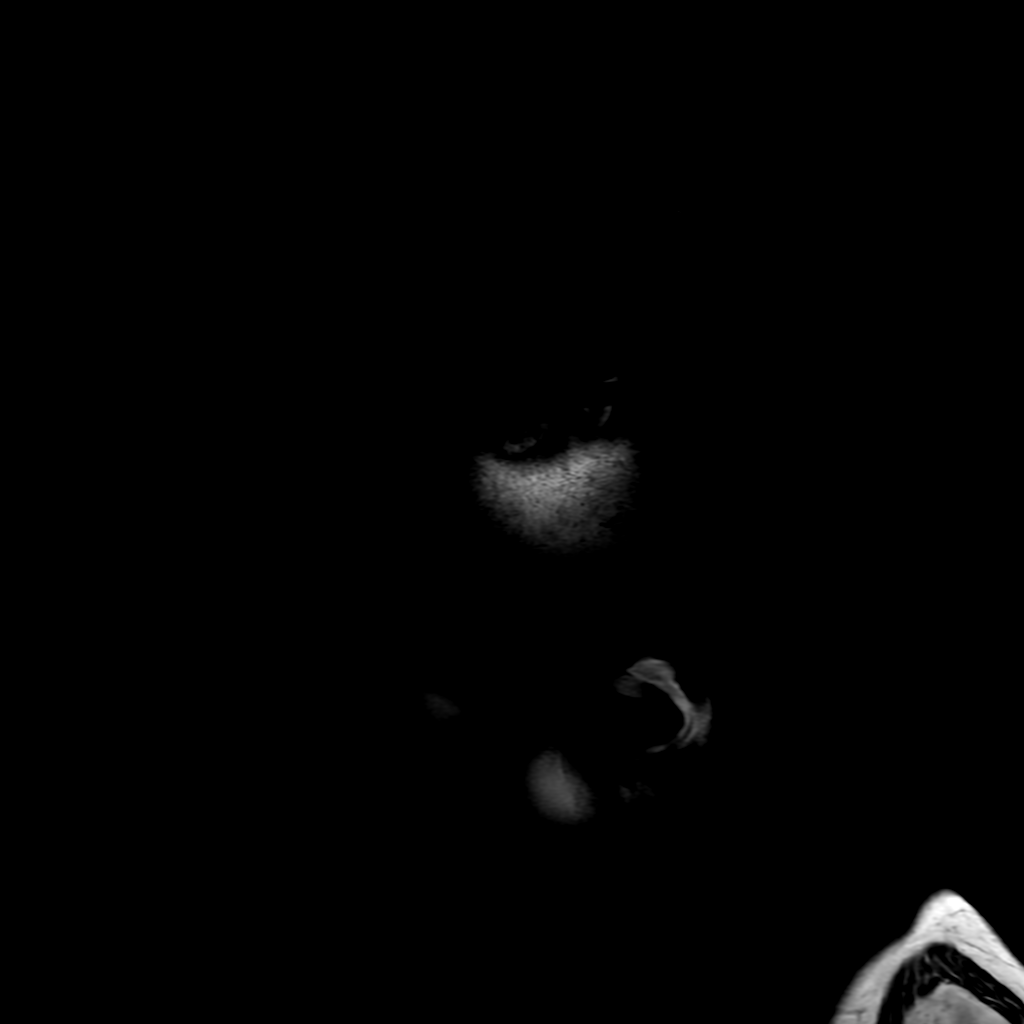

[Series 6: T2 · axial · 5.0mm · 0.23mm/px · 1 of 24 slices shown]
[im 1/24]
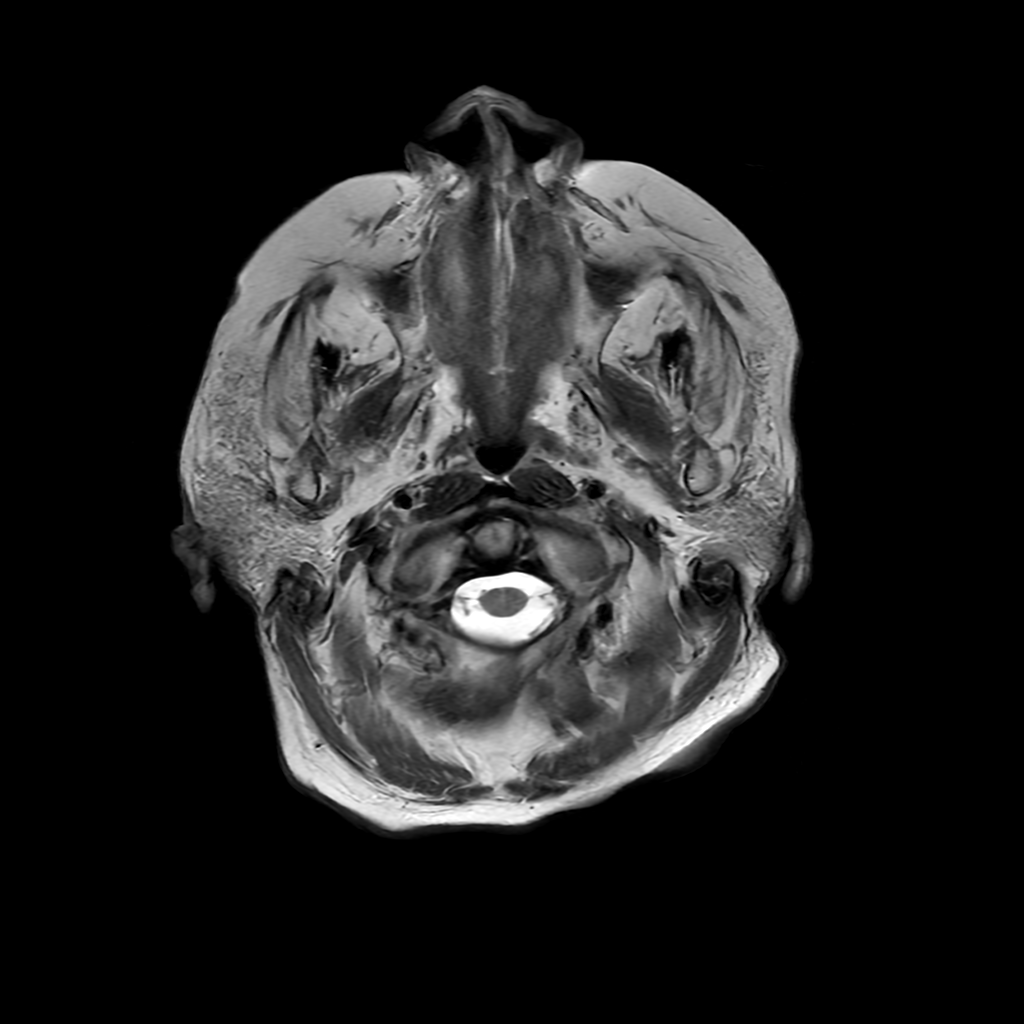

[Series 7: FLAIR · axial · 3.0mm · 0.41mm/px · z∈[-116,+11]mm · 2 of 23 slices shown (2 of 2)]
[im 1/23]
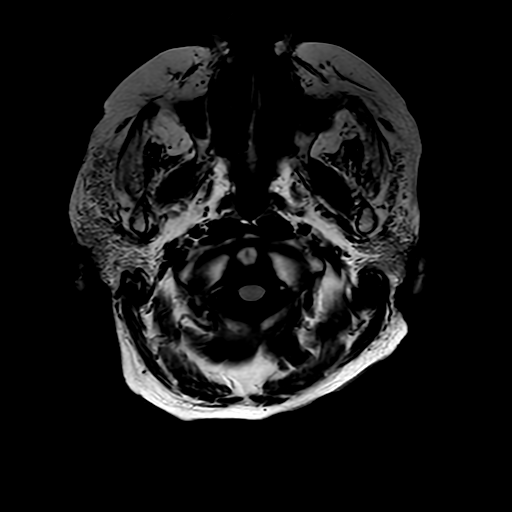
[im 23/23]
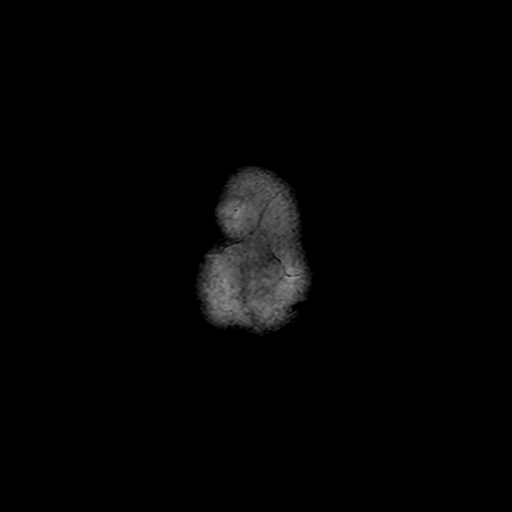

[Series 250: ADC · axial · 3.0mm · 0.94mm/px · z∈[-124,+4]mm · 3 of 46 slices shown (1 of 2)]
[im 1/46]
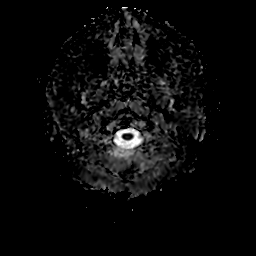
[im 23/46]
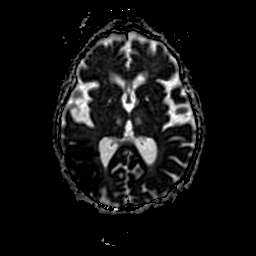
[im 46/46]
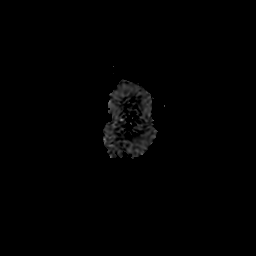

[Series 350: ADC · coronal · 4.0mm · 0.94mm/px · 2 of 32 slices shown (2 of 2)]
[im 1/32]
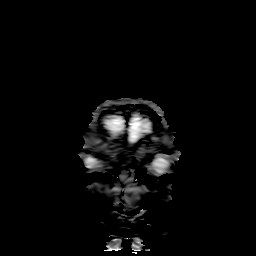
[im 32/32]
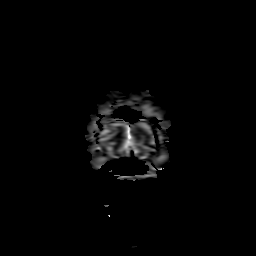

[18 of 48 positions shown; findings below may reference images not displayed]

FINDINGS: MRI HEAD FINDINGS

Brain: There is a moderate-sized region of restricted diffusion in
the posterior right MCA territory involving the temporoparietal
junction consistent with an acute to early subacute infarct. There
is a small amount of associated petechial hemorrhage. Chronic
bilateral frontoparietal cortical infarcts are noted with associated
chronic blood products. Patchy to confluent T2 hyperintensities
elsewhere in the cerebral white matter bilaterally and in the pons
are similar to the prior MRI and nonspecific but compatible with
extensive chronic small vessel ischemic disease. Multiple chronic
infarcts are again noted in the cerebellum bilaterally as well as in
the thalami and basal ganglia. There is moderate cerebral atrophy.
No mass, midline shift, or extra-axial fluid collection is
identified.

Vascular: Evaluated below.

Skull and upper cervical spine: No suspicious marrow lesion.

Sinuses/Orbits: Unremarkable orbits. Paranasal sinuses and mastoid
air cells are clear.

Other: None.

MRA HEAD FINDINGS

The visualized distal right vertebral artery is patent and supplies
the basilar. As seen on the prior head and neck MRA, the left
vertebral artery is hypoplastic and may be chronically occluded
distal to the PICA origin which was not included on today's study. A
patent right PICA is partially visualized. Patent AICAs and SCAs are
also seen bilaterally. The basilar artery is patent without evidence
of significant stenosis. There is a fetal origin of both PCAs
without evidence of significant proximal PCA stenosis.

The internal carotid arteries are widely patent from skull base to
carotid termini. ACAs and MCAs are patent without evidence of
proximal branch occlusion or significant proximal stenosis. No
aneurysm is identified.
IMPRESSION: 1. Moderate-sized acute to early subacute posterior right MCA
infarct.
2. Extensive chronic small vessel ischemic disease with multiple
chronic infarcts as above.
3. No acute large vessel occlusion or significant proximal
intracranial stenosis.

## 2020-02-11 IMAGING — DX DG ABD PORTABLE 1V
1 series · 1 of 1 positions shown · non-contrast
Comparison: 01/01/2019

CLINICAL DATA: Abdominal pain.

EXAM:
PORTABLE ABDOMEN - 1 VIEW

[abdomen kub]
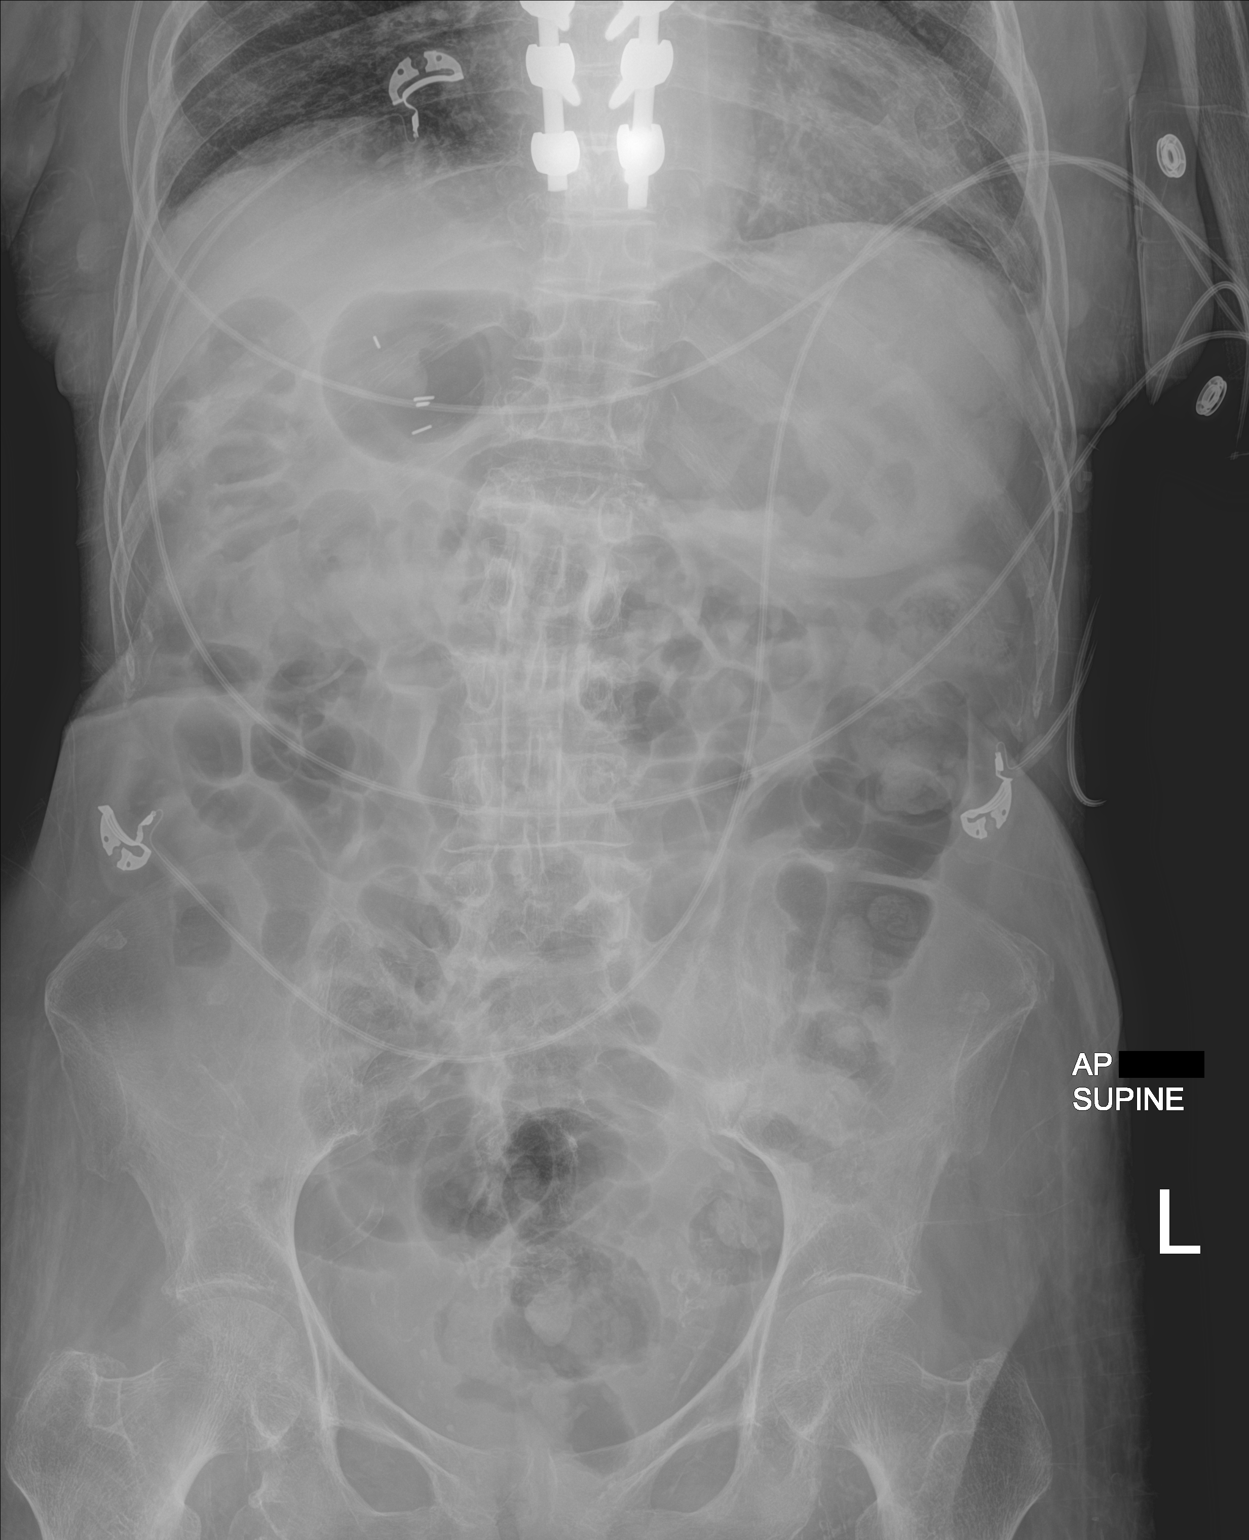

[1 of 1 positions shown; findings below may reference images not displayed]

FINDINGS: Nonobstructive bowel gas pattern. No supine evidence of
pneumoperitoneum. No pneumatosis or portal venous gas.

Post cholecystectomy.

Limited visualization of the lower thorax is negative for focal
airspace opacity.

Atherosclerotic plaque within the abdominal aorta and pelvic
vasculature.

Post mid/lower thoracic paraspinal fusion, incompletely evaluated.
No definite acute osseous abnormalities.
IMPRESSION: Nonobstructive bowel gas pattern.

## 2020-02-12 ENCOUNTER — Other Ambulatory Visit: Payer: Medicaid Other | Admitting: Internal Medicine

## 2020-02-12 ENCOUNTER — Other Ambulatory Visit: Payer: Self-pay

## 2020-02-12 ENCOUNTER — Encounter (HOSPITAL_COMMUNITY): Payer: Self-pay | Admitting: Emergency Medicine

## 2020-02-12 ENCOUNTER — Emergency Department (HOSPITAL_COMMUNITY): Payer: Medicaid Other

## 2020-02-12 ENCOUNTER — Inpatient Hospital Stay (HOSPITAL_COMMUNITY)
Admission: EM | Admit: 2020-02-12 | Discharge: 2020-02-14 | DRG: 065 | Disposition: A | Discharge status: Home / Self Care | Payer: Medicaid Other | Attending: Internal Medicine | Admitting: Internal Medicine

## 2020-02-12 DIAGNOSIS — M069 Rheumatoid arthritis, unspecified: Secondary | ICD-10-CM | POA: Diagnosis present

## 2020-02-12 DIAGNOSIS — K219 Gastro-esophageal reflux disease without esophagitis: Secondary | ICD-10-CM | POA: Diagnosis present

## 2020-02-12 DIAGNOSIS — I639 Cerebral infarction, unspecified: Secondary | ICD-10-CM | POA: Diagnosis not present

## 2020-02-12 DIAGNOSIS — R29704 NIHSS score 4: Secondary | ICD-10-CM | POA: Diagnosis present

## 2020-02-12 DIAGNOSIS — Z841 Family history of disorders of kidney and ureter: Secondary | ICD-10-CM

## 2020-02-12 DIAGNOSIS — R29810 Facial weakness: Secondary | ICD-10-CM | POA: Diagnosis present

## 2020-02-12 DIAGNOSIS — E875 Hyperkalemia: Secondary | ICD-10-CM | POA: Diagnosis present

## 2020-02-12 DIAGNOSIS — D649 Anemia, unspecified: Secondary | ICD-10-CM | POA: Diagnosis not present

## 2020-02-12 DIAGNOSIS — Z87891 Personal history of nicotine dependence: Secondary | ICD-10-CM

## 2020-02-12 DIAGNOSIS — I6930 Unspecified sequelae of cerebral infarction: Secondary | ICD-10-CM

## 2020-02-12 DIAGNOSIS — Z7952 Long term (current) use of systemic steroids: Secondary | ICD-10-CM

## 2020-02-12 DIAGNOSIS — I6502 Occlusion and stenosis of left vertebral artery: Secondary | ICD-10-CM | POA: Diagnosis present

## 2020-02-12 DIAGNOSIS — Z7901 Long term (current) use of anticoagulants: Secondary | ICD-10-CM

## 2020-02-12 DIAGNOSIS — D696 Thrombocytopenia, unspecified: Secondary | ICD-10-CM | POA: Diagnosis present

## 2020-02-12 DIAGNOSIS — R471 Dysarthria and anarthria: Secondary | ICD-10-CM | POA: Diagnosis present

## 2020-02-12 DIAGNOSIS — I5032 Chronic diastolic (congestive) heart failure: Secondary | ICD-10-CM | POA: Diagnosis not present

## 2020-02-12 DIAGNOSIS — Q2112 Patent foramen ovale: Secondary | ICD-10-CM

## 2020-02-12 DIAGNOSIS — J45909 Unspecified asthma, uncomplicated: Secondary | ICD-10-CM | POA: Diagnosis present

## 2020-02-12 DIAGNOSIS — D631 Anemia in chronic kidney disease: Secondary | ICD-10-CM | POA: Diagnosis present

## 2020-02-12 DIAGNOSIS — Q211 Atrial septal defect: Secondary | ICD-10-CM

## 2020-02-12 DIAGNOSIS — I2510 Atherosclerotic heart disease of native coronary artery without angina pectoris: Secondary | ICD-10-CM | POA: Diagnosis present

## 2020-02-12 DIAGNOSIS — E1122 Type 2 diabetes mellitus with diabetic chronic kidney disease: Secondary | ICD-10-CM | POA: Diagnosis present

## 2020-02-12 DIAGNOSIS — M549 Dorsalgia, unspecified: Secondary | ICD-10-CM | POA: Diagnosis present

## 2020-02-12 DIAGNOSIS — N189 Chronic kidney disease, unspecified: Secondary | ICD-10-CM | POA: Diagnosis present

## 2020-02-12 DIAGNOSIS — Z79899 Other long term (current) drug therapy: Secondary | ICD-10-CM

## 2020-02-12 DIAGNOSIS — I63119 Cerebral infarction due to embolism of unspecified vertebral artery: Secondary | ICD-10-CM

## 2020-02-12 DIAGNOSIS — I130 Hypertensive heart and chronic kidney disease with heart failure and stage 1 through stage 4 chronic kidney disease, or unspecified chronic kidney disease: Secondary | ICD-10-CM | POA: Diagnosis present

## 2020-02-12 DIAGNOSIS — R569 Unspecified convulsions: Secondary | ICD-10-CM

## 2020-02-12 DIAGNOSIS — Z79891 Long term (current) use of opiate analgesic: Secondary | ICD-10-CM

## 2020-02-12 DIAGNOSIS — E871 Hypo-osmolality and hyponatremia: Secondary | ICD-10-CM | POA: Diagnosis present

## 2020-02-12 DIAGNOSIS — Z20822 Contact with and (suspected) exposure to covid-19: Secondary | ICD-10-CM | POA: Diagnosis present

## 2020-02-12 DIAGNOSIS — I63432 Cerebral infarction due to embolism of left posterior cerebral artery: Principal | ICD-10-CM | POA: Diagnosis present

## 2020-02-12 LAB — I-STAT CHEM 8, ED
BUN: 24 mg/dL — ABNORMAL HIGH (ref 8–23)
Calcium, Ion: 1.15 mmol/L (ref 1.15–1.40)
Chloride: 116 mmol/L — ABNORMAL HIGH (ref 98–111)
Creatinine, Ser: 0.9 mg/dL (ref 0.44–1.00)
Glucose, Bld: 98 mg/dL (ref 70–99)
HCT: 27 % — ABNORMAL LOW (ref 36.0–46.0)
Hemoglobin: 9.2 g/dL — ABNORMAL LOW (ref 12.0–15.0)
Potassium: 4.6 mmol/L (ref 3.5–5.1)
Sodium: 144 mmol/L (ref 135–145)
TCO2: 25 mmol/L (ref 22–32)

## 2020-02-12 LAB — CBC
HCT: 24.2 % — ABNORMAL LOW (ref 36.0–46.0)
Hemoglobin: 7.9 g/dL — ABNORMAL LOW (ref 12.0–15.0)
MCH: 28.1 pg (ref 26.0–34.0)
MCHC: 32.6 g/dL (ref 30.0–36.0)
MCV: 86.1 fL (ref 80.0–100.0)
Platelets: 115 10*3/uL — ABNORMAL LOW (ref 150–400)
RBC: 2.81 MIL/uL — ABNORMAL LOW (ref 3.87–5.11)
RDW: 19.3 % — ABNORMAL HIGH (ref 11.5–15.5)
WBC: 8.3 10*3/uL (ref 4.0–10.5)
nRBC: 0 % (ref 0.0–0.2)

## 2020-02-12 LAB — DIFFERENTIAL
Abs Immature Granulocytes: 0.04 10*3/uL (ref 0.00–0.07)
Basophils Absolute: 0 10*3/uL (ref 0.0–0.1)
Basophils Relative: 0 %
Eosinophils Absolute: 0 10*3/uL (ref 0.0–0.5)
Eosinophils Relative: 0 %
Immature Granulocytes: 1 %
Lymphocytes Relative: 35 %
Lymphs Abs: 2.9 10*3/uL (ref 0.7–4.0)
Monocytes Absolute: 0.8 10*3/uL (ref 0.1–1.0)
Monocytes Relative: 10 %
Neutro Abs: 4.5 10*3/uL (ref 1.7–7.7)
Neutrophils Relative %: 54 %

## 2020-02-12 LAB — COMPREHENSIVE METABOLIC PANEL
ALT: 12 U/L (ref 0–44)
AST: 58 U/L — ABNORMAL HIGH (ref 15–41)
Albumin: 2.3 g/dL — ABNORMAL LOW (ref 3.5–5.0)
Alkaline Phosphatase: 62 U/L (ref 38–126)
Anion gap: 6 (ref 5–15)
BUN: 17 mg/dL (ref 8–23)
CO2: 22 mmol/L (ref 22–32)
Calcium: 8.4 mg/dL — ABNORMAL LOW (ref 8.9–10.3)
Chloride: 118 mmol/L — ABNORMAL HIGH (ref 98–111)
Creatinine, Ser: 0.95 mg/dL (ref 0.44–1.00)
GFR calc Af Amer: >60 mL/min (ref >60)
GFR calc non Af Amer: >60 mL/min (ref >60)
Glucose, Bld: 105 mg/dL — ABNORMAL HIGH (ref 70–99)
Potassium: 5.9 mmol/L — ABNORMAL HIGH (ref 3.5–5.1)
Sodium: 146 mmol/L — ABNORMAL HIGH (ref 135–145)
Total Bilirubin: 0.8 mg/dL (ref 0.3–1.2)
Total Protein: 5.2 g/dL — ABNORMAL LOW (ref 6.5–8.1)

## 2020-02-12 LAB — GLUCOSE, CAPILLARY: Glucose-Capillary: 79 mg/dL (ref 70–99)

## 2020-02-12 LAB — ETHANOL: Alcohol, Ethyl (B): <10 mg/dL (ref <10)

## 2020-02-12 MED ORDER — FUROSEMIDE 40 MG PO TABS
40.0000 mg | ORAL_TABLET | Freq: Every day | ORAL | Status: DC
  Administered 2020-02-13 – 2020-02-14 (×2): 40 mg via ORAL
  Filled 2020-02-12 (×2): qty 1

## 2020-02-12 MED ORDER — ACETAMINOPHEN 325 MG PO TABS
650.0000 mg | ORAL_TABLET | ORAL | Status: DC | PRN
  Administered 2020-02-14: 650 mg via ORAL
  Filled 2020-02-12: qty 2

## 2020-02-12 MED ORDER — GADOBUTROL 1 MMOL/ML IV SOLN
4.5000 mL | Freq: Once | INTRAVENOUS | Status: AC | PRN
  Administered 2020-02-12: 4.5 mL via INTRAVENOUS

## 2020-02-12 MED ORDER — ALBUTEROL SULFATE (2.5 MG/3ML) 0.083% IN NEBU: 2.5000 mg | INHALATION_SOLUTION | RESPIRATORY_TRACT | Status: DC | PRN

## 2020-02-12 MED ORDER — ACETAMINOPHEN 160 MG/5ML PO SOLN: 650.0000 mg | ORAL | Status: DC | PRN

## 2020-02-12 MED ORDER — FUROSEMIDE 10 MG/ML IJ SOLN
40.0000 mg | Freq: Once | INTRAMUSCULAR | Status: AC
  Administered 2020-02-13: 40 mg via INTRAVENOUS
  Filled 2020-02-12: qty 4

## 2020-02-12 MED ORDER — LABETALOL HCL 5 MG/ML IV SOLN: 10.0000 mg | INTRAVENOUS | Status: DC | PRN

## 2020-02-12 MED ORDER — ACETAMINOPHEN 650 MG RE SUPP: 650.0000 mg | RECTAL | Status: DC | PRN

## 2020-02-12 MED ORDER — OXYCODONE HCL 5 MG PO TABS
10.0000 mg | ORAL_TABLET | Freq: Four times a day (QID) | ORAL | Status: DC | PRN
  Administered 2020-02-13 – 2020-02-14 (×6): 10 mg via ORAL
  Filled 2020-02-12 (×6): qty 2

## 2020-02-12 NOTE — ED Provider Notes (Signed)
I saw and evaluated the patient, reviewed the resident's note and I agree with the findings and plan.  EKG: EKG Interpretation  Date/Time:  Thursday February 12 2020 18:37:00 EDT Ventricular Rate:  91 PR Interval:    QRS Duration: 69 QT Interval:  361 QTC Calculation: 445 R Axis:   26 Text Interpretation: Sinus rhythm Short PR interval Abnormal T, consider ischemia, diffuse leads Confirmed by Lorre Nick ([id number]) on 02/12/2020 6:38:69 PM  64 year old female who presents with acute onset of left lower extremity paresthesias.  History of stroke in the past and states that she chronically has had deficits there.  Does use a walker normally.  Today her approximately 7 hours ago she developed symptoms of new paresthesias to her left arm as well.  No headache.  No visual changes.  No change to her speech.  On exam, she has no drift in her upper extremities.  Strength in her left lower extremity is 3 of 5 in right lower extremity is 5 of 5.  Will image and likely consult neurology   Lorre Nick, MD 02/12/20 [phone]

## 2020-02-12 NOTE — Consult Note (Signed)
Neurology Consultation Reason for Consult: Dysarthria Referring Physician: Zenia Resides, a  CC: Dysarthria  History is obtained from: Patient  HPI: Amber [name] is a 64 y.o. female with a history of recent stroke which was from presumed embolic source, but unclear what that source was.  He was in her normal state of health until today when she noticed around 11 AM that she was having worsening dysarthria.  She came into the emergency department where she was triaged and symptoms of neglect were noted and therefore a code stroke was activated.  She was recently admitted for a right-sided stroke which left her with the neglect.   LKW: 11 AM tpa given?: no, outside of window   ROS: A 14 point ROS was performed and is negative except as noted in the HPI.   Past Medical History:  Diagnosis Date  . ACS (acute coronary syndrome) ([place]) 06/12/2015  . Acute on chronic blood loss anemia 11/25/2017  . Arthritis   . Asthma   . Cerebrovascular accident (CVA) due to embolism ([place]) 10/26/2016  . Chronic pain   . Constipation   . Diabetes mellitus without complication ([place])   . Duodenitis   . Embolic stroke ([place])   . Falls frequently   . Focal seizure ([place])   . GERD (gastroesophageal reflux disease)   . Hypertension   . Impacted fracture 02/15/2017   right glenoid  . Pneumonia 10/2015  . Seizures ([place])    last seizure March 2015  . Small bowel obstruction ([place]) 11/25/2017     Family History  Problem Relation Age of Onset  . Kidney failure Mother   . Hypertension Sister   . Hypertension Brother      Social History:  reports that she quit smoking about 11 years ago. She has never used smokeless tobacco. She reports that she does not drink alcohol or use drugs.   Exam: Current vital signs: BP (!) 180/124 (BP Location: Left Arm)   Pulse 90   Temp 98.3 F (36.8 C) (Oral)   Resp 18   Ht 5' (1.524 m)   Wt 47.6 kg   SpO2 100%   BMI 20.49 kg/m  Vital signs in last 24 hours: Temp:  [98.3  F (36.8 C)] 98.3 F (36.8 C) (03/25 1839) Pulse Rate:  [85-90] 90 (03/25 1900) Resp:  [12-18] 18 (03/25 1900) BP: (166-180)/(107-124) 180/124 (03/25 1900) SpO2:  [100 %] 100 % (03/25 1900) Weight:  [47.6 kg] 47.6 kg (03/25 1841)   Physical Exam  Constitutional: Appears well-developed and well-nourished.  Psych: Affect appropriate to situation Eyes: No scleral injection HENT: No OP obstrucion MSK: no joint deformities.  Cardiovascular: Normal rate and regular rhythm.  Respiratory: Effort normal, non-labored breathing GI: Soft.  No distension. There is no tenderness.  Skin: WDI  Neuro: Mental Status: Patient is awake, alert, oriented to person, place, month, year, and situation. Patient is able to give a clear and coherent history. No signs of aphasia  She does have left-sided sensory neglect Cranial Nerves: II: Left hemianopia pupils are equal, round, and reactive to light.   III,IV, VI: EOMI without ptosis or diploplia.  V: Facial sensation is symmetric to temperature VII: Facial movement with mild left facial weakness VIII: hearing is intact to voice X: Uvula elevates symmetrically XI: Shoulder shrug is symmetric. XII: tongue is midline without atrophy or fasciculations.  Motor: Tone is normal. Bulk is normal. 5/5 strength was present on the right, 4+/5 strength in the left leg, 4/5 strength in the  left arm, no drift Sensory: She reports symmetric sensation, however she does extinguish to double simultaneous stimulation Cerebellar: No clear ataxia   I have reviewed labs in epic and the results pertinent to this consultation are: Hyponatremia at 146, hyperkalemia at 5.9  I have reviewed the images obtained: MRI brain-punctate embolic appearing infarcts in the left  Impression: 64 year old female with recurrent embolic stroke of unclear source.  She had a PFO on previous TEE a few years ago.  She did not have any clear DVT previously, but is only on DVT prophylactic  dosing of Eliquis.  I do think further evaluation of her deep vessels that are not visualized on ultrasound would be prudent, e.g. MR imaging of the pelvis.  Recommendations: 1) MR imaging of the pelvis to rule out DVT 2) continue Eliquis 2.5 twice daily 3) ASA 81 mg daily unless Eliquis is increased to therapeutic dose. 4) PT, ST   Ritta Slot, MD [facility] [phone]  If 7pm- 7am, please page neurology on call as listed in AMION.

## 2020-02-12 NOTE — ED Notes (Signed)
RN spoke to pt daughter Helmut Muster and updated her on pt current status and admission.

## 2020-02-12 NOTE — ED Triage Notes (Signed)
Pt here ems with slurred speech. L. Sided weakness, decreased sensation on L. Side. Hx of stroke 1 month ago. A&ox4 . 24g in 24G l. Hand. LSN 10am per pt. Granddaughter stated that she noticed speech 1 hr ago per EMS. Pt takes eliquis.

## 2020-02-12 NOTE — ED Provider Notes (Signed)
[facility] EMERGENCY DEPARTMENT Provider Note   CSN: [id number] Arrival date & time: 02/12/20  1829     History Chief Complaint  Patient presents with  . Stroke Symptoms    Amber [name] is a 64 y.o. female.  HPI Patient is a 64 year old female with a PMH of type 2 diabetes, hypertension, gout, asthma, CKD and recent stroke (posterior right MCA infarct in February 2021) presenting to the ED today due to concern for new stroke.  Patient reports that at approximately 1100, she began to experience paresthesias and weakness in her left upper and lower extremities.  She reports slurred speech and left-sided facial numbness.  Patient's family also endorsed noticing a change in her speech and subsequently called 911.  She experienced left leg cramping which she says she experienced during her previous stroke.  Patient denies any new falls or traumatic injuries.  Patient does endorse left-sided residual weakness from her prior stroke but says that this is new from previous.  She denies headache, change in vision, fever, chills, nausea, vomiting, diarrhea or any sick contacts.    Past Medical History:  Diagnosis Date  . ACS (acute coronary syndrome) ([place]) 06/12/2015  . Acute on chronic blood loss anemia 11/25/2017  . Arthritis   . Asthma   . Cerebrovascular accident (CVA) due to embolism ([place]) 10/26/2016  . Chronic pain   . Constipation   . Diabetes mellitus without complication ([place])   . Duodenitis   . Embolic stroke ([place])   . Falls frequently   . Focal seizure ([place])   . GERD (gastroesophageal reflux disease)   . Hypertension   . Impacted fracture 02/15/2017   right glenoid  . Pneumonia 10/2015  . Seizures ([place])    last seizure March 2015  . Small bowel obstruction ([place]) 11/25/2017    Patient Active Problem List   Diagnosis Date Noted  . Stroke ([place]) 12/25/2019  . Acute ischemic stroke ([place]) 12/24/2019  . Chronic diastolic (congestive) heart failure ([place])  02/05/2019  . CKD (chronic kidney disease) stage 3, GFR 30-59 ml/min 02/05/2019  . Chronic anticoagulation 02/05/2019  . Palliative care encounter   . Septic shock ([place]) 12/29/2018  . Pressure injury of skin 10/11/2018  . Sepsis due to pneumonia ([place]) 10/10/2018  . Failure to thrive in adult 10/10/2018  . 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 Anasarca 10/10/2018  . HCAP (healthcare-associated pneumonia) 09/24/2018  . Acute metabolic encephalopathy 07/62/2633  . Metabolic acidosis 35/45/6256  . Mild persistent asthma 05/09/2018  . 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 Moderate malnutrition ([place]) 05/07/2018  . Paroxysmal atrial fibrillation with RVR ([place]) 05/07/2018  . Nausea & vomiting 05/06/2018  . Chronic anemia 05/06/2018  . PFO (patent foramen ovale) 02/15/2017  . History of CVA with residual deficit 02/15/2017  . Drug-induced hyperglycemia 02/15/2017  . Cerebrovascular accident (CVA) due to embolism ([place]) 10/26/2016  . Sepsis ([place]) 02/29/2016  . Abdominal pain 11/07/2015  . Constipation 11/07/2015  . Hypomagnesemia 08/25/2015  . Diabetes mellitus type 2, controlled ([place]) 07/27/2015  . Chronic back pain   . Frequent falls   . Hypertensive heart disease   . Pulmonary hypertension ([place])   . Gout   . Microcytic anemia 01/26/2015  . History of seizure 01/26/2015    Past Surgical History:  Procedure Laterality Date  . APPENDECTOMY    . BACK SURGERY    . CHOLECYSTECTOMY    . TEE WITHOUT CARDIOVERSION N/A 10/30/2016   Procedure: TRANSESOPHAGEAL ECHOCARDIOGRAM (TEE);  Surgeon: Jamse Belfast  Delton See, MD;  Location: [facility] ENDOSCOPY;  Service: Cardiovascular;  Laterality: N/A;     OB History   No obstetric history on file.     Family History  Problem Relation Age of Onset  . Kidney failure Mother   . Hypertension Sister   . Hypertension Brother     Social History   Tobacco Use  . Smoking status: Former Smoker    Quit date: 2010    Years since quitting: 11.2  . Smokeless  tobacco: Never Used  Substance Use Topics  . Alcohol use: No    Comment: admits to 2 drinks/week  . Drug use: No    Home Medications Prior to Admission medications   Medication Sig Start Date End Date Taking? Authorizing Provider  acetaminophen (TYLENOL) 500 MG tablet Take 2 tablets (1,000 mg total) by mouth every 8 (eight) hours. 12/30/19   Almon Hercules, MD  albuterol (PROVENTIL HFA;VENTOLIN HFA) 108 (90 Base) MCG/ACT inhaler Inhale 2 puffs into the lungs every 4 (four) hours as needed for wheezing or shortness of breath. 01/16/19   Glade Lloyd, MD  allopurinol (ZYLOPRIM) 300 MG tablet Take 300 mg by mouth daily. 11/11/16   [provider]  apixaban (ELIQUIS) 2.5 MG TABS tablet Take 2.5 mg by mouth 2 (two) times daily.    [provider]  atorvastatin (LIPITOR) 10 MG tablet Take 10 mg by mouth daily at 6 PM.  08/13/15   [provider]  calcium-vitamin D (OSCAL WITH D) 500-200 MG-UNIT tablet Take 2 tablets by mouth daily with breakfast. 12/30/19   Almon Hercules, MD  CREON 24000-76000 units CPEP Take 1 capsule by mouth 3 (three) times daily.  09/23/19   [provider]  esomeprazole (NEXIUM) 40 MG capsule Take 40 mg by mouth 2 (two) times daily before a meal.  11/26/19   [provider]  feeding supplement, ENSURE ENLIVE, (ENSURE ENLIVE) LIQD Take 237 mLs by mouth 2 (two) times daily between meals. 12/30/19 02/28/20  Almon Hercules, MD  folic acid (FOLVITE) 1 MG tablet Take 1 mg by mouth daily. 11/20/19   [provider]  furosemide (LASIX) 40 MG tablet Take 1 tablet (40 mg total) by mouth daily as needed for fluid or edema (sob). 01/02/20 03/02/20  Almon Hercules, MD  lactulose (CHRONULAC) 10 GM/15ML solution Take 30 mls (20 g) 2-3 times a day for a goal of 1-2 bowel movements a day. Patient not taking: Reported on 01/19/2020 12/30/19   Almon Hercules, MD  metoprolol succinate (TOPROL-XL) 25 MG 24 hr tablet Take 1 tablet (25 mg total) by mouth daily.  10/15/18   Mikhail, Nita Sells, DO  nystatin-triamcinolone ointment (MYCOLOG) Apply 1 application topically 2 (two) times daily as needed for rash. 09/23/19   [provider]  ondansetron (ZOFRAN-ODT) 4 MG disintegrating tablet Take 4 mg by mouth every 8 (eight) hours as needed for nausea/vomiting. 12/05/19   [provider]  Oxycodone HCl 20 MG TABS Take 1 tablet by mouth every 6 (six) hours as needed (severe pain).  12/08/19   [provider]  predniSONE (DELTASONE) 5 MG tablet Take 1 tablet (5 mg total) by mouth daily with breakfast. 01/16/19   Glade Lloyd, MD  valproic acid (DEPAKENE) 250 MG capsule Take 250 mg by mouth 3 (three) times daily.     [provider]    Allergies    Ivp dye [iodinated diagnostic agents] and Metrizamide  Review of Systems   Review of Systems  Constitutional: Negative for chills and fever.  HENT: Negative for ear pain and sore throat.   Eyes: Negative for pain and visual disturbance.  Respiratory: Negative for cough and shortness of breath.   Cardiovascular: Negative for chest pain and palpitations.  Gastrointestinal: Negative for abdominal pain, diarrhea, nausea and vomiting.  Genitourinary: Negative for dysuria and hematuria.  Musculoskeletal: Negative for arthralgias, back pain and neck pain.  Skin: Negative for color change and rash.  Neurological: Positive for speech difficulty and weakness. Negative for seizures, syncope and headaches.  Psychiatric/Behavioral: Negative for agitation.  All other systems reviewed and are negative.   Physical Exam Updated Vital Signs BP (!) 166/107 (BP Location: Left Arm)   Pulse 87   Temp 98.3 F (36.8 C) (Oral)   Resp 18   SpO2 100%   Physical Exam Vitals and nursing note reviewed.  Constitutional:      General: She is not in acute distress.    Appearance: Normal appearance. She is well-developed and normal weight. She is not ill-appearing.  HENT:     Head: Normocephalic and  atraumatic.     Right Ear: External ear normal.     Left Ear: External ear normal.     Nose: Nose normal. No congestion or rhinorrhea.     Mouth/Throat:     Mouth: Mucous membranes are moist.     Pharynx: Oropharynx is clear.  Eyes:     Extraocular Movements: Extraocular movements intact.     Pupils: Pupils are equal, round, and reactive to light.  Cardiovascular:     Rate and Rhythm: Normal rate and regular rhythm.     Pulses: Normal pulses.     Heart sounds: Normal heart sounds.  Pulmonary:     Effort: Pulmonary effort is normal. No respiratory distress.     Breath sounds: Normal breath sounds. No stridor. No wheezing, rhonchi or rales.  Abdominal:     General: There is no distension.     Palpations: Abdomen is soft.     Tenderness: There is no abdominal tenderness.  Musculoskeletal:        General: Normal range of motion.     Cervical back: Normal range of motion and neck supple.     Right lower leg: No edema.     Left lower leg: No edema.  Skin:    General: Skin is warm and dry.     Capillary Refill: Capillary refill takes less than 2 seconds.  Neurological:     Mental Status: She is alert and oriented to person, place, and time. Mental status is at baseline.     Cranial Nerves: No cranial nerve deficit.     Sensory: No sensory deficit.     Comments: Muscle strength 4/5 in upper and lower extremities with decreased grip strength in the left hand in comparison to the right.  No cranial nerve deficits or facial asymmetry appreciated.  Psychiatric:        Mood and Affect: Mood normal.     ED Results / Procedures / Treatments   Labs (all labs ordered are listed, but only abnormal results are displayed) Labs Reviewed  CBC - Abnormal; Notable for the following components:      Result Value   RBC 2.81 (*)    Hemoglobin 7.9 (*)    HCT 24.2 (*)    RDW 19.3 (*)    Platelets 115 (*)    All other components within normal limits  COMPREHENSIVE METABOLIC PANEL - Abnormal;  Notable for  the following components:   Sodium 146 (*)    Potassium 5.9 (*)    Chloride 118 (*)    Glucose, Bld 105 (*)    Calcium 8.4 (*)    Total Protein 5.2 (*)    Albumin 2.3 (*)    AST 58 (*)    All other components within normal limits  I-STAT CHEM 8, ED - Abnormal; Notable for the following components:   Chloride 116 (*)    BUN 24 (*)    Hemoglobin 9.2 (*)    HCT 27.0 (*)    All other components within normal limits  SARS CORONAVIRUS 2 (TAT 6-24 HRS)  ETHANOL  DIFFERENTIAL  GLUCOSE, CAPILLARY  RAPID URINE DRUG SCREEN, HOSP PERFORMED  URINALYSIS, ROUTINE W REFLEX MICROSCOPIC  COMPREHENSIVE METABOLIC PANEL  CBC    EKG EKG Interpretation  Date/Time:  Thursday February 12 2020 18:37:00 EDT Ventricular Rate:  91 PR Interval:    QRS Duration: 69 QT Interval:  361 QTC Calculation: 445 R Axis:   26 Text Interpretation: Sinus rhythm Short PR interval Abnormal T, consider ischemia, diffuse leads Confirmed by Lorre Nick ([id number]) on 02/12/2020 6:40:32 PM   Radiology MR ANGIO HEAD WO CONTRAST  Result Date: 02/12/2020 CLINICAL DATA:  64 year old female code stroke presentation with left side weakness and slurred speech. 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 EXAM: MRA HEAD WITHOUT CONTRAST TECHNIQUE: Angiographic images of the Circle of Willis were obtained using MRA technique without intravenous contrast. COMPARISON:  Neck MRA and brain MRI today reported separately. Intracranial MRA 12/25/2019. FINDINGS: Diminutive posterior circulation with stable antegrade flow in the distal right vertebral artery and basilar. The left V4 segment appears to be severely stenotic, unchanged. Dominant left AICA suspected. Patent right PICA origin. Fetal type bilateral PCA origins. SCA origins remain patent. Bilateral PCA branches are stable without significant stenosis. Stable antegrade flow in both ICA siphons. Normal posterior communicating artery origins. Patent carotid termini. Normal MCA and ACA  origins. No siphon stenosis. Stable bilateral MCA and ACA branches with no hemodynamically significant stenosis. IMPRESSION: 1. Stable intracranial MRA since last month: 2. Patent anterior circulation with no hemodynamically significant stenosis. 3. 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 Electronically Signed   By: Odessa Fleming M.D.   On: 02/12/2020 20:50   MR ANGIO NECK W WO CONTRAST  Result Date: 02/12/2020 CLINICAL DATA:  64 year old female code stroke presentation with left side weakness and slurred speech. 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 EXAM: MRA NECK WITHOUT AND WITH CONTRAST TECHNIQUE: Multiplanar and multiecho pulse sequences of the neck were obtained without and with intravenous contrast. Angiographic images of the neck were obtained using MRA technique without and with intravenous contrast. CONTRAST:  4.63mL GADAVIST GADOBUTROL 1 MMOL/ML IV SOLN COMPARISON:  Noncontrast head CT earlier today. [facility] Neck MRA 11/12/2018 FINDINGS: Precontrast time-of-flight neck MRA reveals antegrade flow in both cervical carotid and vertebral arteries. Tortuous great vessels and proximal left CCA. Post-contrast neck MRA images reveal a 3 vessel arch configuration. Tortuous proximal great vessels. Tortuous right CCA without stenosis. Patent right carotid bifurcation. Tortuous right ICA distal to the bulb, no stenosis. Tortuous left CCA without stenosis. Patent left carotid bifurcation with evidence of chronic anterior plaque but no cervical left ICA stenosis. Limited detail of both vertebral artery origins. The right vertebral artery appears dominant. Both vertebral arteries are patent to the skull base and appear  stable since 2019. IMPRESSION: 1. Tortuous cervical carotid arteries with mild atherosclerosis and no significant stenosis. 2. Limited evaluation of the  cervical vertebral arteries: The right is dominant, and both are patent to the skull base but stenosis at the vertebral artery origins cannot be excluded. Electronically Signed   By: Odessa Fleming M.D.   On: 02/12/2020 20:37   MR BRAIN WO CONTRAST  Result Date: 02/12/2020 CLINICAL DATA:  64 year old female code stroke presentation with left side weakness and slurred speech. 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 EXAM: MRI HEAD WITHOUT CONTRAST TECHNIQUE: Multiplanar, multiecho pulse sequences of the brain and surrounding structures were obtained without intravenous contrast. COMPARISON:  Brain MRI 12/25/2019. Neck MRA today. FINDINGS: Brain: There are 2 or 3 punctate foci of restricted diffusion in the posterior and medial left parietal lobe (series 5, image 78). No other convincing restricted diffusion. Previous bilateral MCA territory infarcts, more extensive on the right. Chronic right PCA territory infarct. Chronic and developing encephalomalacia in both hemispheres. Patchy chronic bilateral cerebellar infarcts. Chronic lacunar infarcts in the bilateral deep gray nuclei, especially both medial thalami. Confluent superimposed bilateral cerebral white matter T2 and FLAIR hyperintensity. Scattered hemosiderin, mostly associated with the prior cortical infarcts. No midline shift, mass effect, evidence of mass lesion, ventriculomegaly, extra-axial collection or acute intracranial hemorrhage. Cervicomedullary junction and pituitary are within normal limits. Vascular: Major intracranial vascular flow voids are stable since February. Skull and upper cervical spine: Widespread cervical spine degeneration with evidence of mild spinal stenosis. Normal bone marrow signal. Sinuses/Orbits: Stable, negative. Other: Mastoids remain clear. Visible internal auditory structures appear normal. IMPRESSION: 1. Several punctate acute infarcts in the left parietal lobe. No associated hemorrhage or mass effect. 2. Otherwise stable underlying very  advanced chronic ischemic disease, including the posterior right MCA territory infarct from last month. Electronically Signed   By: Odessa Fleming M.D.   On: 02/12/2020 20:41   CT HEAD CODE STROKE WO CONTRAST  Result Date: 02/12/2020 CLINICAL DATA:  Code stroke. 64 year old female left side weakness and slurred speech. Status post confluent posterior right MCA territory infarct last month. Last seen normal 1100 hours today. EXAM: CT HEAD WITHOUT CONTRAST TECHNIQUE: Contiguous axial images were obtained from the base of the skull through the vertex without intravenous contrast. COMPARISON:  Brain MRI 12/25/2019, head CT 12/24/2019. FINDINGS: Brain: Expected evolution of the posterior right MCA territory infarct since last month. Superimposed chronic encephalomalacia of the right motor strip tracking to the operculum, and posterosuperior right frontal gyrus. Small area of left superior frontal gyrus cortical encephalomalacia is stable. Superimposed Patchy and confluent bilateral cerebral white matter hypodensity. Chronic bilateral deep gray matter lacunar infarcts. Small chronic bilateral cerebellar infarcts. Stable gray-white matter differentiation throughout the brain. No cortically based acute infarct identified. No acute intracranial hemorrhage identified. No midline shift, mass effect, or evidence of intracranial mass lesion. No ventriculomegaly. Vascular: Extensive ICA siphon calcified plaque.No suspicious intracranial vascular hyperdensity. Skull: Osteopenia.  No acute osseous abnormality identified. Sinuses/Orbits: Visualized paranasal sinuses and mastoids are stable and well pneumatized. Other: No acute orbit or scalp soft tissue finding. ASPECTS [facility] Stroke Program Early CT Score) Total score (0-10 with 10 being normal): 10 (chronic and developing encephalomalacia). IMPRESSION: 1. No acute cortically based infarct or acute intracranial hemorrhage identified. 2. Stable CT appearance of advanced brain  ischemia since last month. Expected evolution of the posterior right MCA infarct. 3. ASPECTS 10 when allowing for chronic changes. 4. These results were communicated to Dr. Amada Jupiter at 7:09 pm on  02/12/2020 by text page via the [facility] messaging system. Electronically Signed   By: Odessa Fleming M.D.   On: 02/12/2020 19:10    Procedures Procedures (including critical care time)  Medications Ordered in ED Medications  acetaminophen (TYLENOL) tablet 650 mg (has no administration in time range)    Or  acetaminophen (TYLENOL) 160 MG/5ML solution 650 mg (has no administration in time range)    Or  acetaminophen (TYLENOL) suppository 650 mg (has no administration in time range)  senna-docusate (Senokot-S) tablet 1 tablet (has no administration in time range)  allopurinol (ZYLOPRIM) tablet 300 mg (has no administration in time range)  oxyCODONE (Oxy IR/ROXICODONE) immediate release tablet 10 mg (10 mg Oral Given 02/13/20 0040)  atorvastatin (LIPITOR) tablet 10 mg (has no administration in time range)  predniSONE (DELTASONE) tablet 5 mg (has no administration in time range)  lipase/protease/amylase (CREON) capsule 24,000 Units (has no administration in time range)  pantoprazole (PROTONIX) EC tablet 40 mg (40 mg Oral Given 02/13/20 0112)  apixaban (ELIQUIS) tablet 2.5 mg (has no administration in time range)  folic acid (FOLVITE) tablet 1 mg (has no administration in time range)  valproic acid (DEPAKENE) 250 MG capsule 250 mg (has no administration in time range)  calcium-vitamin D (OSCAL WITH D) 500-200 MG-UNIT per tablet 2 tablet (has no administration in time range)  feeding supplement (ENSURE ENLIVE) (ENSURE ENLIVE) liquid 237 mL (has no administration in time range)  albuterol (PROVENTIL) (2.5 MG/3ML) 0.083% nebulizer solution 2.5 mg (has no administration in time range)  labetalol (NORMODYNE) injection 10 mg (has no administration in time range)  furosemide (LASIX) tablet 40 mg (has no administration  in time range)  gadobutrol (GADAVIST) 1 MMOL/ML injection 4.5 mL (4.5 mLs Intravenous Contrast Given 02/12/20 2026)  furosemide (LASIX) injection 40 mg (40 mg Intravenous Given 02/13/20 0040)    ED Course  I have reviewed the triage vital signs and the nursing notes.  Pertinent labs & imaging results that were available during my care of the patient were reviewed by me and considered in my medical decision making (see chart for details).    MDM Rules/Calculators/A&P                     Patient is a 64 year old female with a PMH of type 2 diabetes, hypertension, gout, asthma, CKD and recent stroke (posterior right MCA infarct in February 2021) presenting to the ED today due to concern for new stroke.  On exam she has generalized weakness with decreased grip strength in her left hand in comparison to right.  Vital signs stable.  Afebrile.  On arrival, patient appears generally well and is displaying no signs of acute distress.  Per EMR, she has residual left-sided deficits from her previous stroke.  However, patient is endorsing new left-sided weakness and slurred speech.  I do not really appreciate any dysarthria or facial asymmetry.  However, EMS reports that family was also concerned about slurred speech.  Given this history, initiated stroke activation in the setting of recent ischemic stroke.  CT head negative for acute injuries.  MR brain shows "several punctate acute infarcts in the left parietal lobe with no associated hemorrhage or mass-effect."  MRA head and neck showed no acute injuries.  Following neurology's evaluation, they feel as though patient would benefit from admission with MRV pelvis to rule out DVT in the setting of new ischemic stroke.  CBC with hemoglobin 7.9.  CMP with potassium 5.9, calcium 8.4, albumin 2.3.  Covid negative.  EKG with sinus rhythm and T wave inversions in I, II, V3-V6 which appears consistent with previous.  No peaked T waves.  At this time, patient admitted  to hospitalist service.  For details on hospital course following admission, please refer to inpatient team's note.  Patient stable at time of admission.  Patient assessed and evaluated with Dr. Freida Busman.  Delray Alt, MD   Final Clinical Impression(s) / ED Diagnoses Final diagnoses:  None    Rx / DC Orders ED Discharge Orders    None       Delray Alt, MD 02/13/20 1324    Lorre Nick, MD 02/13/20 [phone]

## 2020-02-12 NOTE — H&P (Signed)
History and Physical    Amber [name]:[id number] DOB: 17-Jan-1956 DOA: 02/12/2020  PCP: [name], MD   Patient coming from: Home   Chief Complaint: Dysarthria   HPI: Amber [name] is a 64 y.o. 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  Patient had another stroke last month, suspected to be embolic, and reports ongoing left-sided deficits, but felt that her dysarthria had worsened this morning.  She reports being in her usual state of health last night and initially felt well this morning but noticed she was having difficulty speaking at around 11 AM.  Patient tells me that her left-sided deficits seem unchanged.  She denies any recent fevers or chills.  She complains of back pain which she describes as an ongoing issue that she manages with oxycodone at home.  She denies chest pain or palpitations.  She reports increased bilateral lower extremity edema recently and takes Lasix for this as needed at home.  She took a dose of Lasix this morning.  ED Course: Upon arrival to the ED, patient is found to be afebrile, saturating well on room air, hypertensive to 180/124.  EKG with sinus rhythm and T wave inversions, similar to last month.  Chemistry panel notable for potassium 5.9 and slight elevation in AST.  CBC with chronic normocytic anemia and thrombocytopenia.  CT head was read as negative for acute intracranial abnormality but notable for expected evolution of the posterior right MCA infarction.  Patient went for MRI brain and MRA head and neck, neurology has evaluated the patient, and medical admission was recommended.  Review of Systems:  All other systems reviewed and apart from HPI, are negative.  Past Medical History:  Diagnosis Date  . ACS (acute coronary syndrome) (HCC) 06/12/2015  . Acute on chronic  blood loss anemia 11/25/2017  . Arthritis   . Asthma   . 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 Chronic pain   . Constipation   . Diabetes mellitus without complication (HCC)   . Duodenitis   . Embolic stroke (HCC)   . Falls frequently   . Focal seizure (HCC)   . GERD (gastroesophageal reflux disease)   . Hypertension   . Impacted fracture 02/15/2017   right glenoid  . Pneumonia 10/2015  . Seizures (HCC)    last seizure March 2015  . Small bowel obstruction (HCC) 11/25/2017    Past Surgical History:  Procedure Laterality Date  . APPENDECTOMY    . BACK SURGERY    . CHOLECYSTECTOMY    . TEE WITHOUT CARDIOVERSION N/A 10/30/2016   Procedure: TRANSESOPHAGEAL ECHOCARDIOGRAM (TEE);  Surgeon: Lars Masson, MD;  Location: [facility] ENDOSCOPY;  Service: Cardiovascular;  Laterality: N/A;     reports that she quit smoking about 11 years ago. She has never used smokeless tobacco. She reports that she does not drink alcohol or use drugs.  Allergies  Allergen Reactions  . Ivp Dye [Iodinated Diagnostic Agents] Itching  . Metrizamide Itching    Family History  Problem Relation Age of Onset  . Kidney failure Mother   . Hypertension Sister   . Hypertension Brother      Prior to Admission medications   Medication Sig Start Date End Date Taking? Authorizing Provider  acetaminophen (TYLENOL) 500 MG tablet Take 2 tablets (1,000 mg total) by mouth every 8 (eight) hours. 12/30/19  Almon Hercules, MD  albuterol (PROVENTIL HFA;VENTOLIN HFA) 108 (90 Base) MCG/ACT inhaler Inhale 2 puffs into the lungs every 4 (four) hours as needed for wheezing or shortness of breath. 01/16/19   Glade Lloyd, MD  allopurinol (ZYLOPRIM) 300 MG tablet Take 300 mg by mouth daily. 11/11/16   [provider]  apixaban (ELIQUIS) 2.5 MG TABS tablet Take 2.5 mg by mouth 2 (two) times daily.    [provider]  atorvastatin (LIPITOR) 10 MG tablet Take 10 mg by mouth daily at 6 PM.   08/13/15   [provider]  calcium-vitamin D (OSCAL WITH D) 500-200 MG-UNIT tablet Take 2 tablets by mouth daily with breakfast. 12/30/19   Almon Hercules, MD  CREON 24000-76000 units CPEP Take 1 capsule by mouth 3 (three) times daily.  09/23/19   [provider]  esomeprazole (NEXIUM) 40 MG capsule Take 40 mg by mouth 2 (two) times daily before a meal.  11/26/19   [provider]  feeding supplement, ENSURE ENLIVE, (ENSURE ENLIVE) LIQD Take 237 mLs by mouth 2 (two) times daily between meals. 12/30/19 02/28/20  Almon Hercules, MD  folic acid (FOLVITE) 1 MG tablet Take 1 mg by mouth daily. 11/20/19   [provider]  furosemide (LASIX) 40 MG tablet Take 1 tablet (40 mg total) by mouth daily as needed for fluid or edema (sob). 01/02/20 03/02/20  Almon Hercules, MD  KLOR-CON M20 20 MEQ tablet Take 20 mEq by mouth 2 (two) times daily. 01/10/20   [provider]  lactulose (CHRONULAC) 10 GM/15ML solution Take 30 mls (20 g) 2-3 times a day for a goal of 1-2 bowel movements a day. Patient not taking: Reported on 01/19/2020 12/30/19   Almon Hercules, MD  metoprolol succinate (TOPROL-XL) 25 MG 24 hr tablet Take 1 tablet (25 mg total) by mouth daily. 10/15/18   Mikhail, Nita Sells, DO  nystatin-triamcinolone ointment (MYCOLOG) Apply 1 application topically 2 (two) times daily as needed for rash. 09/23/19   [provider]  ondansetron (ZOFRAN-ODT) 4 MG disintegrating tablet Take 4 mg by mouth every 8 (eight) hours as needed for nausea/vomiting. 12/05/19   [provider]  Oxycodone HCl 20 MG TABS Take 1 tablet by mouth every 6 (six) hours as needed (severe pain).  12/08/19   [provider]  predniSONE (DELTASONE) 5 MG tablet Take 1 tablet (5 mg total) by mouth daily with breakfast. 01/16/19   Glade Lloyd, MD  valproic acid (DEPAKENE) 250 MG capsule Take 250 mg by mouth 3 (three) times daily.     [provider]    Physical Exam: Vitals:   02/12/20  1841 02/12/20 1843 02/12/20 1900 02/12/20 2030  BP:   (!) 180/124 (!) 155/98  Pulse:  85 90 98  Resp:  12 18 16   Temp:      TempSrc:      SpO2:  100% 100% 100%  Weight: 47.6 kg     Height: 5' (1.524 m)       Constitutional: NAD, calm  Eyes: PERTLA, lids and conjunctivae normal ENMT: Mucous membranes are moist. Posterior pharynx clear of any exudate or lesions.   Neck: normal, supple, no masses, no thyromegaly Respiratory:  no wheezing, no crackles. No accessory muscle use.  Cardiovascular: S1 & S2 heard, regular rate and rhythm. Pretibial pitting edema bilaterally. Abdomen: No distension, no tenderness, soft. Bowel sounds active.  Musculoskeletal: no clubbing / cyanosis. No joint deformity upper and lower extremities.  Skin: no significant rashes, lesions, ulcers. Warm, dry, well-perfused. Neurologic: CN 2-12 grossly intact. Sensation to light touch intact. Strength 4/5 throughout LUE and LLE, 5/5 on right.  Psychiatric: Alert and oriented x 3. Pleasant and cooperative.    Labs and Imaging on Admission: I have personally reviewed following labs and imaging studies  CBC: Recent Labs  Lab 02/12/20 1842 02/12/20 1910  WBC 8.3  --   NEUTROABS 4.5  --   HGB 7.9* 9.2*  HCT 24.2* 27.0*  MCV 86.1  --   PLT 115*  --    Basic Metabolic Panel: Recent Labs  Lab 02/12/20 1842 02/12/20 1910  NA 146* 144  K 5.9* 4.6  CL 118* 116*  CO2 22  --   GLUCOSE 105* 98  BUN 17 24*  CREATININE 0.95 0.90  CALCIUM 8.4*  --    GFR: Estimated Creatinine Clearance: 47.2 mL/min (by C-G formula based on SCr of 0.9 mg/dL). Liver Function Tests: Recent Labs  Lab 02/12/20 1842  AST 58*  ALT 12  ALKPHOS 62  BILITOT 0.8  PROT 5.2*  ALBUMIN 2.3*   No results for input(s): LIPASE, AMYLASE in the last 168 hours. No results for input(s): AMMONIA in the last 168 hours. Coagulation Profile: No results for input(s): INR, PROTIME in the last 168 hours. Cardiac Enzymes: No results for  input(s): CKTOTAL, CKMB, CKMBINDEX, TROPONINI in the last 168 hours. BNP (last 3 results) No results for input(s): PROBNP in the last 8760 hours. HbA1C: No results for input(s): HGBA1C in the last 72 hours. CBG: No results for input(s): GLUCAP in the last 168 hours. Lipid Profile: No results for input(s): CHOL, HDL, LDLCALC, TRIG, CHOLHDL, LDLDIRECT in the last 72 hours. Thyroid Function Tests: No results for input(s): TSH, T4TOTAL, FREET4, T3FREE, THYROIDAB in the last 72 hours. Anemia Panel: No results for input(s): VITAMINB12, FOLATE, FERRITIN, TIBC, IRON, RETICCTPCT in the last 72 hours. Urine analysis:    Component Value Date/Time   COLORURINE YELLOW 12/25/2019 0510   APPEARANCEUR HAZY (A) 12/25/2019 0510   LABSPEC 1.015 12/25/2019 0510   PHURINE 6.0 12/25/2019 0510   GLUCOSEU NEGATIVE 12/25/2019 0510   HGBUR NEGATIVE 12/25/2019 0510   BILIRUBINUR NEGATIVE 12/25/2019 0510   KETONESUR NEGATIVE 12/25/2019 0510   PROTEINUR NEGATIVE 12/25/2019 0510   UROBILINOGEN 1.0 07/27/2015 1052   NITRITE NEGATIVE 12/25/2019 0510   LEUKOCYTESUR MODERATE (A) 12/25/2019 0510   Sepsis Labs: @LABRCNTIP (procalcitonin:4,lacticidven:4) )No results found for this or any previous visit (from the past 240 hour(s)).   Radiological Exams on Admission: MR ANGIO NECK W WO CONTRAST  Result Date: 02/12/2020 CLINICAL DATA:  64 year old female code stroke presentation with left side weakness and slurred speech. 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 EXAM: MRA NECK WITHOUT AND WITH CONTRAST TECHNIQUE: Multiplanar and multiecho pulse sequences of the neck were obtained without and with intravenous contrast. Angiographic images of the neck were obtained using MRA technique without and with intravenous contrast. CONTRAST:  4.49mL GADAVIST GADOBUTROL 1 MMOL/ML IV SOLN COMPARISON:  Noncontrast head CT earlier today. [facility] Neck MRA 11/12/2018 FINDINGS: Precontrast time-of-flight  neck MRA reveals antegrade flow in both cervical carotid and vertebral arteries. Tortuous great vessels and proximal left CCA. Post-contrast neck MRA images reveal a 3 vessel arch configuration. Tortuous proximal great vessels. Tortuous right CCA without stenosis. Patent right carotid bifurcation. Tortuous right ICA distal to the bulb, no stenosis. Tortuous left CCA without stenosis. Patent left carotid bifurcation with evidence of chronic anterior  plaque but no cervical left ICA stenosis. Limited detail of both vertebral artery origins. The right vertebral artery appears dominant. Both vertebral arteries are patent to the skull base and appear stable since 2019. IMPRESSION: 1. Tortuous cervical carotid arteries with mild atherosclerosis and no significant stenosis. 2. Limited evaluation of the cervical vertebral arteries: The right is dominant, and both are patent to the skull base but stenosis at the vertebral artery origins cannot be excluded. Electronically Signed   By: Odessa Fleming M.D.   On: 02/12/2020 20:37   MR BRAIN WO CONTRAST  Result Date: 02/12/2020 CLINICAL DATA:  64 year old female code stroke presentation with left side weakness and slurred speech. 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 EXAM: MRI HEAD WITHOUT CONTRAST TECHNIQUE: Multiplanar, multiecho pulse sequences of the brain and surrounding structures were obtained without intravenous contrast. COMPARISON:  Brain MRI 12/25/2019. Neck MRA today. FINDINGS: Brain: There are 2 or 3 punctate foci of restricted diffusion in the posterior and medial left parietal lobe (series 5, image 78). No other convincing restricted diffusion. Previous bilateral MCA territory infarcts, more extensive on the right. Chronic right PCA territory infarct. Chronic and developing encephalomalacia in both hemispheres. Patchy chronic bilateral cerebellar infarcts. Chronic lacunar infarcts in the bilateral deep gray nuclei, especially both medial thalami. Confluent superimposed  bilateral cerebral white matter T2 and FLAIR hyperintensity. Scattered hemosiderin, mostly associated with the prior cortical infarcts. No midline shift, mass effect, evidence of mass lesion, ventriculomegaly, extra-axial collection or acute intracranial hemorrhage. Cervicomedullary junction and pituitary are within normal limits. Vascular: Major intracranial vascular flow voids are stable since February. Skull and upper cervical spine: Widespread cervical spine degeneration with evidence of mild spinal stenosis. Normal bone marrow signal. Sinuses/Orbits: Stable, negative. Other: Mastoids remain clear. Visible internal auditory structures appear normal. IMPRESSION: 1. Several punctate acute infarcts in the left parietal lobe. No associated hemorrhage or mass effect. 2. Otherwise stable underlying very advanced chronic ischemic disease, including the posterior right MCA territory infarct from last month. Electronically Signed   By: Odessa Fleming M.D.   On: 02/12/2020 20:41   CT HEAD CODE STROKE WO CONTRAST  Result Date: 02/12/2020 CLINICAL DATA:  Code stroke. 64 year old female left side weakness and slurred speech. Status post confluent posterior right MCA territory infarct last month. Last seen normal 1100 hours today. EXAM: CT HEAD WITHOUT CONTRAST TECHNIQUE: Contiguous axial images were obtained from the base of the skull through the vertex without intravenous contrast. COMPARISON:  Brain MRI 12/25/2019, head CT 12/24/2019. FINDINGS: Brain: Expected evolution of the posterior right MCA territory infarct since last month. Superimposed chronic encephalomalacia of the right motor strip tracking to the operculum, and posterosuperior right frontal gyrus. Small area of left superior frontal gyrus cortical encephalomalacia is stable. Superimposed Patchy and confluent bilateral cerebral white matter hypodensity. Chronic bilateral deep gray matter lacunar infarcts. Small chronic bilateral cerebellar infarcts. Stable  gray-white matter differentiation throughout the brain. No cortically based acute infarct identified. No acute intracranial hemorrhage identified. No midline shift, mass effect, or evidence of intracranial mass lesion. No ventriculomegaly. Vascular: Extensive ICA siphon calcified plaque.No suspicious intracranial vascular hyperdensity. Skull: Osteopenia.  No acute osseous abnormality identified. Sinuses/Orbits: Visualized paranasal sinuses and mastoids are stable and well pneumatized. Other: No acute orbit or scalp soft tissue finding. ASPECTS [facility] Stroke Program Early CT Score) Total score (0-10 with 10 being normal): 10 (chronic and developing encephalomalacia). IMPRESSION: 1. No acute cortically based infarct or acute intracranial hemorrhage identified. 2. Stable CT appearance of advanced brain ischemia since  last month. Expected evolution of the posterior right MCA infarct. 3. ASPECTS 10 when allowing for chronic changes. 4. These results were communicated to Dr. Leonel Ramsay at 7:09 pm on 02/12/2020 by text page via the [facility] messaging system. Electronically Signed   By: Genevie Ann M.D.   On: 02/12/2020 19:10    EKG: Independently reviewed. Sinus rhythm, T-wave inversions, similar to prior.   Assessment/Plan   1. Acute CVA; hx of CVA - Presents with speech difficulty and is found to have several acute punctate infarctions in left parietal lobe - Patient has hx of multiple CVAs including right MCA infarct last month, suspected to be embolic and had PFO on TEE in 2017 - Upper and lower ext dopplers recently negative, she has been on Eliquis 2.5 mg BID  - Check MRV pelvis, continue cardiac monitoring, continue Eliquis, continue aspirin and statin, consult PT/OT/SLP   2. Anemia  - Hgb appears stable, no bleeding    3. Hypertension  - Treat as needed only for now    4. Rheumatoid arthritis  - Continue prednisone 5mg  qD and as-needed oxycodone   5. History of seizures  - Continue valproate     6. Chronic diastolic CHF  - Patient reports recent increase in bilateral LE swelling  - Given Lasix 40 mg IV on admission in light of increased swelling and elevated potassium  - Follow I/Os, continue Lasix     DVT prophylaxis: Eliquis  Code Status: Full, confirmed with patient  Family Communication: Discussed with patient  Disposition Plan: Likely ready for hospital discharge within 24-48 hours. PT recommended SNF last month but patient elected to go home Consults called: neurology  Admission status: Observation     Vianne Bulls, MD [facility] Pager: See [email or web address]  If 7AM-7PM, please contact the daytime attending [email or web address]  02/12/2020, 8:48 PM

## 2020-02-12 NOTE — Progress Notes (Incomplete)
[facility] Pall[facility] Consult Note Telephone: [phone]  Fax: [phone]  PATIENT NAME: Amber [name] DOB: 06/18/1956 MRN: [id number]  PRIMARY CARE PROVIDER:   Sandi Mariscal, MD  REFERRING PROVIDER:  Sandi Mariscal, MD Amber [name],  [place] [id number]  RESPONSIBLE PARTY:          RECOMMENDATIONS and PLAN:  Palliative Care Encounter  Z51.5  1.  Advance Care Planning:  Previous completion of MOST form with selections of full scope of treatment.  She desires to maintain residency at home with assistance of family members and staff of [facility].  She still desires assistance from [facility].  Palliative Care will continue to follow.  2.    I spent *** minutes providing this consultation,  from *** to ***. More than 50% of the time in this consultation was spent coordinating communication.   HISTORY OF PRESENT ILLNESS:  Amber [name] is a 64 y.o. year old female with multiple medical problems including ***. Palliative Care was asked to help address goals of care.   CODE STATUS: FULL CODE  PPS: 40% 1 person assist and use of rollator  HOSPICE ELIGIBILITY/DIAGNOSIS: TBD  PAST MEDICAL HISTORY:  Past Medical History:  Diagnosis Date  . ACS (acute coronary syndrome) ([place]) 06/12/2015  . Acute on chronic blood loss anemia 11/25/2017  . Arthritis   . Asthma   . Cerebrovascular accident (CVA) due to embolism ([place]) 10/26/2016  . Chronic pain   . Constipation   . Diabetes mellitus without complication ([place])   . Duodenitis   . Embolic stroke ([place])   . Falls frequently   . Focal seizure ([place])   . GERD (gastroesophageal reflux disease)   . Hypertension   . Impacted fracture 02/15/2017   right glenoid  . Pneumonia 10/2015  . Seizures ([place])    last seizure March 2015  . Small bowel obstruction ([place]) 11/25/2017     PERTINENT MEDICATIONS:  Outpatient Encounter Medications as of 02/12/2020  Medication Sig  . acetaminophen (TYLENOL) 500 MG tablet  Take 2 tablets (1,000 mg total) by mouth every 8 (eight) hours.  Marland Kitchen albuterol (PROVENTIL HFA;VENTOLIN HFA) 108 (90 Base) MCG/ACT inhaler Inhale 2 puffs into the lungs every 4 (four) hours as needed for wheezing or shortness of breath.  . allopurinol (ZYLOPRIM) 300 MG tablet Take 300 mg by mouth daily.  Marland Kitchen apixaban (ELIQUIS) 2.5 MG TABS tablet Take 2.5 mg by mouth 2 (two) times daily.  Marland Kitchen atorvastatin (LIPITOR) 10 MG tablet Take 10 mg by mouth daily at 6 PM.   . calcium-vitamin D (OSCAL WITH D) 500-200 MG-UNIT tablet Take 2 tablets by mouth daily with breakfast.  . CREON 24000-76000 units CPEP Take 1 capsule by mouth 3 (three) times daily.   Marland Kitchen esomeprazole (NEXIUM) 40 MG capsule Take 40 mg by mouth 2 (two) times daily before a meal.   . feeding supplement, ENSURE ENLIVE, (ENSURE ENLIVE) LIQD Take 237 mLs by mouth 2 (two) times daily between meals.  . folic acid (FOLVITE) 1 MG tablet Take 1 mg by mouth daily.  . furosemide (LASIX) 40 MG tablet Take 1 tablet (40 mg total) by mouth daily as needed for fluid or edema (sob).  . lactulose (CHRONULAC) 10 GM/15ML solution Take 30 mls (20 g) 2-3 times a day for a goal of 1-2 bowel movements a day. (Patient not taking: Reported on 01/19/2020)  . metoprolol succinate (TOPROL-XL) 25 MG 24 hr tablet Take 1 tablet (25 mg total) by mouth  daily.  . nystatin-triamcinolone ointment (MYCOLOG) Apply 1 application topically 2 (two) times daily as needed for rash.  . ondansetron (ZOFRAN-ODT) 4 MG disintegrating tablet Take 4 mg by mouth every 8 (eight) hours as needed for nausea/vomiting.  . Oxycodone HCl 20 MG TABS Take 1 tablet by mouth every 6 (six) hours as needed (severe pain).   . predniSONE (DELTASONE) 5 MG tablet Take 1 tablet (5 mg total) by mouth daily with breakfast.  . valproic acid (DEPAKENE) 250 MG capsule Take 250 mg by mouth 3 (three) times daily.    No facility-administered encounter medications on file as of 02/12/2020.    PHYSICAL EXAM:   General: NAD,  frail appearing, elderly female reclining Cardiovascular: regular rate and rhythm Pulmonary: clear ant fields Abdomen: soft, nontender, + bowel sounds Extremities: no edema, multiple joint deformities Skin: exposed skin intact Neurological:  Alert and oriented x 2,  Some lapse of memory, L hemiparesis with L foot drop  Amber Sheffield, NP-C

## 2020-02-13 ENCOUNTER — Observation Stay (HOSPITAL_COMMUNITY): Payer: Medicaid Other

## 2020-02-13 DIAGNOSIS — I63432 Cerebral infarction due to embolism of left posterior cerebral artery: Secondary | ICD-10-CM | POA: Diagnosis not present

## 2020-02-13 DIAGNOSIS — M549 Dorsalgia, unspecified: Secondary | ICD-10-CM | POA: Diagnosis present

## 2020-02-13 DIAGNOSIS — E875 Hyperkalemia: Secondary | ICD-10-CM | POA: Diagnosis present

## 2020-02-13 DIAGNOSIS — Z20822 Contact with and (suspected) exposure to covid-19: Secondary | ICD-10-CM | POA: Diagnosis present

## 2020-02-13 DIAGNOSIS — R569 Unspecified convulsions: Secondary | ICD-10-CM | POA: Diagnosis present

## 2020-02-13 DIAGNOSIS — D631 Anemia in chronic kidney disease: Secondary | ICD-10-CM | POA: Diagnosis present

## 2020-02-13 DIAGNOSIS — R471 Dysarthria and anarthria: Secondary | ICD-10-CM | POA: Diagnosis present

## 2020-02-13 DIAGNOSIS — N189 Chronic kidney disease, unspecified: Secondary | ICD-10-CM | POA: Diagnosis present

## 2020-02-13 DIAGNOSIS — J45909 Unspecified asthma, uncomplicated: Secondary | ICD-10-CM | POA: Diagnosis present

## 2020-02-13 DIAGNOSIS — R29810 Facial weakness: Secondary | ICD-10-CM | POA: Diagnosis present

## 2020-02-13 DIAGNOSIS — I130 Hypertensive heart and chronic kidney disease with heart failure and stage 1 through stage 4 chronic kidney disease, or unspecified chronic kidney disease: Secondary | ICD-10-CM | POA: Diagnosis present

## 2020-02-13 DIAGNOSIS — I6502 Occlusion and stenosis of left vertebral artery: Secondary | ICD-10-CM | POA: Diagnosis present

## 2020-02-13 DIAGNOSIS — R29704 NIHSS score 4: Secondary | ICD-10-CM | POA: Diagnosis present

## 2020-02-13 DIAGNOSIS — I5032 Chronic diastolic (congestive) heart failure: Secondary | ICD-10-CM | POA: Diagnosis present

## 2020-02-13 DIAGNOSIS — D649 Anemia, unspecified: Secondary | ICD-10-CM | POA: Diagnosis not present

## 2020-02-13 DIAGNOSIS — M069 Rheumatoid arthritis, unspecified: Secondary | ICD-10-CM | POA: Diagnosis present

## 2020-02-13 DIAGNOSIS — E1122 Type 2 diabetes mellitus with diabetic chronic kidney disease: Secondary | ICD-10-CM | POA: Diagnosis present

## 2020-02-13 DIAGNOSIS — Z841 Family history of disorders of kidney and ureter: Secondary | ICD-10-CM | POA: Diagnosis not present

## 2020-02-13 DIAGNOSIS — E871 Hypo-osmolality and hyponatremia: Secondary | ICD-10-CM | POA: Diagnosis present

## 2020-02-13 DIAGNOSIS — I639 Cerebral infarction, unspecified: Secondary | ICD-10-CM | POA: Diagnosis present

## 2020-02-13 DIAGNOSIS — K219 Gastro-esophageal reflux disease without esophagitis: Secondary | ICD-10-CM | POA: Diagnosis present

## 2020-02-13 DIAGNOSIS — D696 Thrombocytopenia, unspecified: Secondary | ICD-10-CM | POA: Diagnosis present

## 2020-02-13 DIAGNOSIS — Q211 Atrial septal defect: Secondary | ICD-10-CM | POA: Diagnosis not present

## 2020-02-13 DIAGNOSIS — I6930 Unspecified sequelae of cerebral infarction: Secondary | ICD-10-CM | POA: Diagnosis not present

## 2020-02-13 DIAGNOSIS — Z87891 Personal history of nicotine dependence: Secondary | ICD-10-CM | POA: Diagnosis not present

## 2020-02-13 DIAGNOSIS — I2510 Atherosclerotic heart disease of native coronary artery without angina pectoris: Secondary | ICD-10-CM | POA: Diagnosis present

## 2020-02-13 LAB — GLUCOSE, CAPILLARY
Glucose-Capillary: 97 mg/dL (ref 70–99)
Glucose-Capillary: 246 mg/dL — ABNORMAL HIGH (ref 70–99)
Glucose-Capillary: 127 mg/dL — ABNORMAL HIGH (ref 70–99)

## 2020-02-13 LAB — COMPREHENSIVE METABOLIC PANEL
ALT: 15 U/L (ref 0–44)
AST: 16 U/L (ref 15–41)
Albumin: 2.2 g/dL — ABNORMAL LOW (ref 3.5–5.0)
Alkaline Phosphatase: 64 U/L (ref 38–126)
Anion gap: 8 (ref 5–15)
BUN: 17 mg/dL (ref 8–23)
CO2: 24 mmol/L (ref 22–32)
Calcium: 8.4 mg/dL — ABNORMAL LOW (ref 8.9–10.3)
Chloride: 113 mmol/L — ABNORMAL HIGH (ref 98–111)
Creatinine, Ser: 0.86 mg/dL (ref 0.44–1.00)
GFR calc Af Amer: >60 mL/min (ref >60)
GFR calc non Af Amer: >60 mL/min (ref >60)
Glucose, Bld: 96 mg/dL (ref 70–99)
Potassium: 3.5 mmol/L (ref 3.5–5.1)
Sodium: 145 mmol/L (ref 135–145)
Total Bilirubin: 0.7 mg/dL (ref 0.3–1.2)
Total Protein: 5.2 g/dL — ABNORMAL LOW (ref 6.5–8.1)

## 2020-02-13 LAB — CBC
HCT: 23.1 % — ABNORMAL LOW (ref 36.0–46.0)
Hemoglobin: 7.6 g/dL — ABNORMAL LOW (ref 12.0–15.0)
MCH: 27.2 pg (ref 26.0–34.0)
MCHC: 32.9 g/dL (ref 30.0–36.0)
MCV: 82.8 fL (ref 80.0–100.0)
Platelets: 83 10*3/uL — ABNORMAL LOW (ref 150–400)
RBC: 2.79 MIL/uL — ABNORMAL LOW (ref 3.87–5.11)
RDW: 18.4 % — ABNORMAL HIGH (ref 11.5–15.5)
WBC: 8.6 10*3/uL (ref 4.0–10.5)
nRBC: 0 % (ref 0.0–0.2)

## 2020-02-13 LAB — SARS CORONAVIRUS 2 (TAT 6-24 HRS): SARS Coronavirus 2: NEGATIVE

## 2020-02-13 MED ORDER — SENNOSIDES-DOCUSATE SODIUM 8.6-50 MG PO TABS: 1.0000 | ORAL_TABLET | Freq: Every evening | ORAL | Status: DC | PRN

## 2020-02-13 MED ORDER — PREDNISONE 5 MG PO TABS
5.0000 mg | ORAL_TABLET | Freq: Every day | ORAL | Status: DC
  Administered 2020-02-13 – 2020-02-14 (×2): 5 mg via ORAL
  Filled 2020-02-13 (×2): qty 1

## 2020-02-13 MED ORDER — ENSURE ENLIVE PO LIQD
237.0000 mL | Freq: Two times a day (BID) | ORAL | Status: DC
  Administered 2020-02-13 – 2020-02-14 (×4): 237 mL via ORAL

## 2020-02-13 MED ORDER — ALLOPURINOL 100 MG PO TABS
300.0000 mg | ORAL_TABLET | Freq: Every day | ORAL | Status: DC
  Administered 2020-02-13 – 2020-02-14 (×2): 300 mg via ORAL
  Filled 2020-02-13 (×2): qty 3

## 2020-02-13 MED ORDER — ATORVASTATIN CALCIUM 10 MG PO TABS
10.0000 mg | ORAL_TABLET | Freq: Every day | ORAL | Status: DC
  Administered 2020-02-13: 10 mg via ORAL
  Filled 2020-02-13 (×2): qty 1

## 2020-02-13 MED ORDER — APIXABAN 2.5 MG PO TABS
2.5000 mg | ORAL_TABLET | Freq: Two times a day (BID) | ORAL | Status: DC
  Administered 2020-02-13: 08:00:00 2.5 mg via ORAL
  Filled 2020-02-13: qty 1

## 2020-02-13 MED ORDER — FOLIC ACID 1 MG PO TABS
1.0000 mg | ORAL_TABLET | Freq: Every day | ORAL | Status: DC
  Administered 2020-02-13 – 2020-02-14 (×2): 1 mg via ORAL
  Filled 2020-02-13 (×2): qty 1

## 2020-02-13 MED ORDER — CALCIUM CARBONATE-VITAMIN D 500-200 MG-UNIT PO TABS
2.0000 | ORAL_TABLET | Freq: Every day | ORAL | Status: DC
  Administered 2020-02-13 – 2020-02-14 (×2): 2 via ORAL
  Filled 2020-02-13 (×2): qty 2

## 2020-02-13 MED ORDER — PANCRELIPASE (LIP-PROT-AMYL) 12000-38000 UNITS PO CPEP
24000.0000 [IU] | ORAL_CAPSULE | Freq: Three times a day (TID) | ORAL | Status: DC
  Administered 2020-02-13 – 2020-02-14 (×5): 24000 [IU] via ORAL
  Filled 2020-02-13 (×7): qty 2

## 2020-02-13 MED ORDER — PANTOPRAZOLE SODIUM 40 MG PO TBEC
40.0000 mg | DELAYED_RELEASE_TABLET | Freq: Two times a day (BID) | ORAL | Status: DC
  Administered 2020-02-13 – 2020-02-14 (×4): 40 mg via ORAL
  Filled 2020-02-13 (×4): qty 1

## 2020-02-13 MED ORDER — APIXABAN 5 MG PO TABS
5.0000 mg | ORAL_TABLET | Freq: Two times a day (BID) | ORAL | Status: DC
  Administered 2020-02-14 (×2): 5 mg via ORAL
  Filled 2020-02-13 (×2): qty 1

## 2020-02-13 MED ORDER — VALPROIC ACID 250 MG PO CAPS
250.0000 mg | ORAL_CAPSULE | Freq: Three times a day (TID) | ORAL | Status: DC
  Administered 2020-02-13 – 2020-02-14 (×5): 250 mg via ORAL
  Filled 2020-02-13 (×5): qty 1

## 2020-02-13 NOTE — Progress Notes (Addendum)
MR MRV w/wo contrast was unable to be preformed due to no IV access. Pt came to dept with small gage IV in hand. When iv was tested it flushed but at great discomfort to pt. Power injector was hooked up to iv and kvo was set. Also injection rate was adjusted for gage of angio cath and pt comfort. At the end of exam, once the contrast was injected the Iv pushed itself out and contrast and blood was found on the table. No other access was available and pt did not want to continue. Exam to be completed as soon as Epic creates MR MRV pelvis W/o as an option.

## 2020-02-13 NOTE — Evaluation (Signed)
Occupational Therapy Evaluation Patient Details Name: Amber [name] MRN: [id number] DOB: January 27, 1956 Today's Date: 02/13/2020    History of Present Illness Pt is a 64 yo female presenting with L-sided weakness, decreased sensation, and slurred speech. MRI showed "several punctate acute infarcts in the left parietal lobe". 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   Clinical Impression   PT admitted with several acute punctate infarctions in left parietal lobe . Pt currently with functional limitiations due to the deficits listed below (see OT problem list). Pt requires 2 person (A) to get to [facility] and chair at this time. Pt previously dc/ home with family with SNF recommendation. Question family ability to continue to provide 24/7 (A) at this level. No family present at this time. Will need higher level care if family can not continue to provide care at this level. Pt reports no family members work to take care of her.  Pt will benefit from skilled OT to increase their independence and safety with adls and balance to allow discharge SNF.     Follow Up Recommendations  Supervision/Assistance - 24 hour;Home health OT    Equipment Recommendations  None recommended by OT    Recommendations for Other Services       Precautions / Restrictions Precautions Precautions: Fall Precaution Comments: Per pt she is bed bound --does not get OOB Restrictions Weight Bearing Restrictions: No      Mobility Bed Mobility Overal bed mobility: Needs Assistance Bed Mobility: Supine to Sit Rolling: Total assist   Supine to sit: Min assist;HOB elevated     General bed mobility comments: minA to transition to sitting EOB, but totalA to scoot hips with use of pads both at EOB and in recliner.  Transfers Overall transfer level: Needs assistance Equipment used: 2 person hand held assist;Rolling walker (2 wheeled) Transfers: Sit to/from [employer] Sit to Stand: +2  physical assistance;Mod assist Stand pivot transfers: +2 physical assistance;Mod assist       General transfer comment: modA of 2 to complete sit-stand with use of RW or HHA due to strong posterior lean and pt fear of falling. Pt able to transition to recliner with minA of 2 and RW for stability    Balance Overall balance assessment: Needs assistance Sitting-balance support: Bilateral upper extremity supported;Feet supported Sitting balance-Leahy Scale: Poor   Postural control: Posterior lean Standing balance support: During functional activity;Bilateral upper extremity supported Standing balance-Leahy Scale: Poor Standing balance comment: heavy reliance on BUE and modA of 2 to maintain upright, especially with mobility.                           ADL either performed or assessed with clinical judgement   ADL Overall ADL's : Needs assistance/impaired Eating/Feeding: Set up;Sitting   Grooming: Wash/dry face;Set up;Sitting       Lower Body Bathing: Total assistance;Bed level       Lower Body Dressing: Total assistance;Bed level   Toilet Transfer: +2 for physical assistance;Maximal assistance;Stand-pivot;BSC;RW Toilet Transfer Details (indicate cue type and reason): pt incontinence x2 in bed. pt declined BSC initially but progressed with cueing. pt with very large void sitting on BSC and states having constipation issues. pt reports "i feel better"  Toileting- Clothing Manipulation and Hygiene: Total assistance;Sit to/from stand Toileting - Clothing Manipulation Details (indicate cue type and reason): pt tolerated static standing for peri care holding RW with PT supporting patient  General ADL Comments: pt stand pivot to [facility] on Right side then to chair on Right side. pt reports pain every step taken. Pt requesting a "snack" Rn made aware. pt eating push pop on arrival but declines it after eating 1/3 of it as therapist were arriving     Vision Patient Visual  Report: No change from baseline       Perception     Praxis      Pertinent Vitals/Pain Pain Assessment: Faces Faces Pain Scale: Hurts worst Pain Location: stomach Pain Descriptors / Indicators: Burning Pain Intervention(s): Monitored during session;Repositioned     Hand Dominance Right   Extremity/Trunk Assessment Upper Extremity Assessment Upper Extremity Assessment: Generalized weakness   Lower Extremity Assessment Lower Extremity Assessment: Defer to PT evaluation   Cervical / Trunk Assessment Cervical / Trunk Assessment: Kyphotic   Communication Communication Communication: No difficulties   Cognition Arousal/Alertness: Awake/alert Behavior During Therapy: WFL for tasks assessed/performed Overall Cognitive Status: History of cognitive impairments - at baseline                                 General Comments: pt generally functional today, redirectable and cooperative. follows commands well   General Comments  pt with skin break down on L inner thigh area. Pt could benefit from barrier cream this admission to prevent further break down. Pt agreeable to continued use of BSC as a result of successful void this session.     Exercises     Shoulder Instructions      Home Living Family/patient expects to be discharged to:: Private residence Living Arrangements: Children;Other relatives Available Help at Discharge: Family;Personal care attendant;Available 24 hours/day Type of Home: Apartment Home Access: Stairs to enter Entrance Stairs-Number of Steps: 5 Entrance Stairs-Rails: Right Home Layout: One level     Bathroom Shower/Tub: Chief Strategy Officer: Standard Bathroom Accessibility: Yes   Home Equipment: Emergency planning/management officer - 4 wheels;Wheelchair - manual;Hospital bed   Additional Comments: pt reports living with daughgter and granddaughter as well as possibly son  Lives With: Family;Daughter    Prior Functioning/Environment  Level of Independence: Needs assistance  Gait / Transfers Assistance Needed: pt reports she uses WC for mobility, RW to get into [facility]. care assistant to supervise and assist with majority of transfers and ADLs ADL's / Homemaking Assistance Needed: Pt reports she can feed herself, but PCA helps her with all other ADLs            OT Problem List: Decreased strength;Decreased range of motion;Impaired balance (sitting and/or standing);Decreased activity tolerance;Impaired UE functional use;Pain;Decreased safety awareness;Decreased knowledge of use of DME or AE;Decreased knowledge of precautions      OT Treatment/Interventions: Self-care/ADL training;Therapeutic exercise;DME and/or AE instruction;Therapeutic activities;Cognitive remediation/compensation;Patient/family education;Balance training;Manual therapy    OT Goals(Current goals can be found in the care plan section) Acute Rehab OT Goals Patient Stated Goal: to return home with family OT Goal Formulation: Patient unable to participate in goal setting Time For Goal Achievement: 02/27/20 Potential to Achieve Goals: Good  OT Frequency: Min 2X/week   Barriers to D/C:            Co-evaluation PT/OT/SLP Co-Evaluation/Treatment: Yes(partial) Reason for Co-Treatment: Complexity of the patient's impairments (multi-system involvement);For patient/therapist safety;To address functional/ADL transfers PT goals addressed during session: Mobility/safety with mobility;Balance OT goals addressed during session: ADL's and self-care;Strengthening/ROM      AM-PAC OT "6 Clicks" Daily Activity  Outcome Measure Help from another person eating meals?: A Little(setup) Help from another person taking care of personal grooming?: A Lot Help from another person toileting, which includes using toliet, bedpan, or urinal?: Total Help from another person bathing (including washing, rinsing, drying)?: Total Help from another person to put on and taking off  regular upper body clothing?: Total Help from another person to put on and taking off regular lower body clothing?: Total 6 Click Score: 9   End of Session Equipment Utilized During Treatment: Gait belt;Rolling walker Nurse Communication: Mobility status;Precautions(RN in room at time of session)  Activity Tolerance: Patient limited by pain Patient left: in chair;with call bell/phone within reach;with chair alarm set  OT Visit Diagnosis: Muscle weakness (generalized) (M62.81);Pain;Other abnormalities of gait and mobility (R26.89) Pain - part of body: (back)                Time: 2924-4628 OT Time Calculation (min): 19 min Charges:  OT General Charges $OT Visit: 1 Visit OT Evaluation $OT Eval Moderate Complexity: 1 Mod   Brynn, OTR/L  Acute Rehabilitation Services Pager: [phone] Office: [phone] .   Jeri Modena 02/13/2020, 1:11 PM

## 2020-02-13 NOTE — Evaluation (Signed)
Speech Language Pathology Evaluation Patient Details Name: Amber [name] MRN: [id number] DOB: [birth year] Today's Date: 02/13/2020 Time: 9417-4081 SLP Time Calculation (min) (ACUTE ONLY): 10 min  Problem List:  Patient Active Problem List   Diagnosis Date Noted  . Hyperkalemia   . Stroke (HCC) 12/25/2019  . Acute ischemic stroke (HCC) 12/24/2019  . Chronic diastolic (congestive) heart failure (HCC) 02/05/2019  . CKD (chronic kidney disease) stage 3, GFR 30-59 ml/min 02/05/2019  . Chronic anticoagulation 02/05/2019  . Palliative care encounter   . Septic shock (HCC) 12/29/2018  . Pressure injury of skin 10/11/2018  . Sepsis due to pneumonia (HCC) 10/10/2018  . Failure to thrive in adult 10/10/2018  . Rheumatoid arthritis (HCC) 10/10/2018  . Anasarca 10/10/2018  . HCAP (healthcare-associated pneumonia) 09/24/2018  . Acute metabolic encephalopathy 05/09/2018  . Metabolic acidosis 05/09/2018  . Mild persistent asthma 05/09/2018  . 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 Moderate malnutrition (HCC) 05/07/2018  . Nausea & vomiting 05/06/2018  . Chronic anemia 05/06/2018  . PFO (patent foramen ovale) 02/15/2017  . History of CVA with residual deficit 02/15/2017  . Drug-induced hyperglycemia 02/15/2017  . Seizures (HCC) 10/26/2016  . 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 Sepsis (HCC) 02/29/2016  . Abdominal pain 11/07/2015  . Constipation 11/07/2015  . Hypomagnesemia 08/25/2015  . Diabetes mellitus type 2, controlled (HCC) 07/27/2015  . Chronic back pain   . Frequent falls   . Hypertensive heart disease   . Pulmonary hypertension (HCC)   . Gout   . Microcytic anemia 01/26/2015  . History of seizure 01/26/2015   Past Medical History:  Past Medical History:  Diagnosis Date  . ACS (acute coronary syndrome) (HCC) 06/12/2015  . Acute on chronic blood loss anemia 11/25/2017  . Arthritis   . Asthma   . Cerebrovascular accident (CVA) due to embolism (HCC) 10/26/2016   . Chronic pain   . Constipation   . Diabetes mellitus without complication (HCC)   . Duodenitis   . Embolic stroke (HCC)   . Falls frequently   . Focal seizure (HCC)   . GERD (gastroesophageal reflux disease)   . Hypertension   . Impacted fracture 02/15/2017   right glenoid  . Pneumonia 10/2015  . Seizures (HCC)    last seizure March 2015  . Small bowel obstruction (HCC) 11/25/2017   Past Surgical History:  Past Surgical History:  Procedure Laterality Date  . APPENDECTOMY    . BACK SURGERY    . CHOLECYSTECTOMY    . TEE WITHOUT CARDIOVERSION N/A 10/30/2016   Procedure: TRANSESOPHAGEAL ECHOCARDIOGRAM (TEE);  Surgeon: Lars Masson, MD;  Location: [facility] ENDOSCOPY;  Service: Cardiovascular;  Laterality: N/A;   HPI:  64 year old female with hx of CVA 2017, GERD, focal seizures admitted with dysarthria.  She had a PFO on previous TEE a few years ago. MRI revealed 2 or 3 punctate foci of restricted diffusion in the posterior and medial left parietal lobe; chronic bilateral MCA territory infarcts and right PCA infarct; chronic and developing encephalomalacia in both hemispheres; chronic bilateral cerebellar infarcts; chronic infarcts bilateral deep gray nuclei, especially both medial thalami.  Has undergone multiple speech/language evaluations in years past.  Last was 12/25/19, with findings of baseline memory deficits, mild articulatory imprecision, language skills functional with decreased comprehension of paragraph-level material, and diffuse cognitive-linguistic deficits crossing orientation, attention, and complex problem solving.  At that time, pt's son reported that pt's functioning was at baseline.    Assessment / Plan / Recommendation Clinical Impression  Based on review of Feb 2021 cognitive-language evaluation, pt's performance appears to be consistent with functioning at that time.  She continues to present with broad cognitive deficits across all sub-areas of functioning, including  storage and retrieval of short-term information, higher-level attention, and problem-solving.  Language- expressive and receptive- appears to be at baseline with no new aphasia.  Speech is intelligible with continued mild imprecision, but this does not impact her ability to be understood by others.  Pt has 24 hour caregiver support at home with assistance for all daily needs.  No acute care or f/u SLP is warranted - our service will sign off.     SLP Assessment  SLP Recommendation/Assessment: Patient does not need any further Speech Lanaguage Pathology Services SLP Visit Diagnosis: Cognitive communication deficit (R41.841)    Follow Up Recommendations    no SLP f/u needed   Frequency and Duration           SLP Evaluation Cognition  Overall Cognitive Status: History of cognitive impairments - at baseline Arousal/Alertness: Awake/alert Orientation Level: Oriented to person;Oriented to place;Oriented to time Attention: Focused Focused Attention: Appears intact Sustained Attention: Impaired Sustained Attention Impairment: Verbal complex Memory: Impaired Memory Impairment: Retrieval deficit;Decreased recall of new information Awareness: Impaired Awareness Impairment: Emergent impairment Problem Solving: Impaired Problem Solving Impairment: Verbal complex       Comprehension  Auditory Comprehension Overall Auditory Comprehension: Appears within functional limits for tasks assessed Visual Recognition/Discrimination Discrimination: Within Function Limits Reading Comprehension Reading Status: Not tested    Expression Expression Primary Mode of Expression: Verbal Verbal Expression Overall Verbal Expression: Appears within functional limits for tasks assessed Written Expression Dominant Hand: Right Written Expression: Not tested   Oral / Motor  Oral Motor/Sensory Function Overall Oral Motor/Sensory Function: Other (comment)(mild right lower face asymmetry) Motor Speech Overall  Motor Speech: Appears within functional limits for tasks assessed   GO                    Amber [name] 02/13/2020, 11:38 AM   Amber [name], [place] Office number [phone] Pager [phone]

## 2020-02-13 NOTE — Plan of Care (Signed)
  Problem: Education: Goal: Knowledge of patient specific risk factors addressed and post discharge goals established will improve Outcome: Progressing   Problem: Education: Goal: Individualized Educational Video(s) Outcome: Progressing   Problem: Education: Goal: Knowledge of secondary prevention will improve Outcome: Progressing   Problem: Education: Goal: Knowledge of disease or condition will improve Outcome: Progressing

## 2020-02-13 NOTE — Progress Notes (Signed)
MR imaging has been done of the pelvis w/o contrast-- results have not been able to be placed into Epic but NO DVT was seen in the iliac or CFV.   Marlin Canary DO

## 2020-02-13 NOTE — Evaluation (Signed)
Physical Therapy Evaluation Patient Details Name: Amber [name] MRN: [id number] DOB: [birth year] Today's Date: 02/13/2020   History of Present Illness  Pt is a 64 yo female presenting with L-sided weakness, decreased sensation, and slurred speech. MRI showed "several punctate acute infarcts in the left parietal lobe", and is undergoing continued work up for possible DVT. 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  Clinical Impression  Pt in bed upon arrival of PT, agreeable to evaluation at this time. Prior to admission the pt was mobilizing with use of a WC, performing ADLs and transfers with assist from family members or personal care assistant. The pt now presents with limitations in functional mobility, strength, and functional stability due to above dx, and will continue to benefit from skilled PT to address these deficits. The pt was able to demo transfers and short ambulation today, but will benefit from continued acute PT to maximize independence with transfers to decrease the caregiver burden at home as the pt reports she is currently receiving assist with the "majority" of tasks.     Follow Up Recommendations Home health PT;Supervision/Assistance - 24 hour(despite pt limited mobility, suspect this is close to her baseline and pt reports family/caregiver is home and able to provide assist)    Equipment Recommendations  None recommended by PT(pt has needed equipment from last visit)    Recommendations for Other Services       Precautions / Restrictions Precautions Precautions: Fall Restrictions Weight Bearing Restrictions: No      Mobility  Bed Mobility Overal bed mobility: Needs Assistance Bed Mobility: Supine to Sit     Supine to sit: Min assist;HOB elevated     General bed mobility comments: minA to transition to sitting EOB, but totalA to scoot hips with use of pads both at EOB and in recliner.  Transfers Overall transfer level: Needs  assistance Equipment used: 2 person hand held assist;Rolling walker (2 wheeled) Transfers: Sit to/from [employer] Sit to Stand: +2 physical assistance;Mod assist Stand pivot transfers: +2 physical assistance;Mod assist       General transfer comment: modA of 2 to complete sit-stand with use of RW or HHA due to strong posterior lean and pt fear of falling. Pt able to transition to recliner with minA of 2 and RW for stability  Ambulation/Gait Ambulation/Gait assistance: +2 physical assistance;Mod assist Gait Distance (Feet): 5 Feet Assistive device: Rolling walker (2 wheeled) Gait Pattern/deviations: Step-to pattern;Decreased stride length;Shuffle;Narrow base of support   Gait velocity interpretation: <1.31 ft/sec, indicative of household ambulator General Gait Details: pt with narrow BOS and poor movement of feet bilaterally, requiring heavy use of RW and modA of 2 to maintain upright. Pt with strong posterior lean, benefits from multimodal cues to move BLE  Stairs            Wheelchair Mobility    Modified Rankin (Stroke Patients Only) Modified Rankin (Stroke Patients Only) Pre-Morbid Rankin Score: Moderately severe disability Modified Rankin: Moderately severe disability     Balance Overall balance assessment: Needs assistance Sitting-balance support: Bilateral upper extremity supported;Feet supported Sitting balance-Leahy Scale: Poor   Postural control: Posterior lean Standing balance support: During functional activity;Bilateral upper extremity supported Standing balance-Leahy Scale: Poor Standing balance comment: heavy reliance on BUE and modA of 2 to maintain upright, especially with mobility.  Pertinent Vitals/Pain Pain Assessment: No/denies pain    Home Living Family/patient expects to be discharged to:: Private residence Living Arrangements: Children;Other relatives Available Help at Discharge:  Family;Personal care attendant;Available 24 hours/day Type of Home: Apartment Home Access: Stairs to enter Entrance Stairs-Rails: Right Entrance Stairs-Number of Steps: 5 Home Layout: One level Home Equipment: Clinical cytogeneticist - 4 wheels;Wheelchair - manual;Hospital bed Additional Comments: pt reports living with daughgter and granddaughter as well as possibly son    Prior Function Level of Independence: Needs assistance   Gait / Transfers Assistance Needed: pt reports she uses WC for mobility, RW to get into [facility]. care assistant to supervise and assist with majority of transfers and ADLs  ADL's / Homemaking Assistance Needed: Pt reports she can feed herself, but PCA helps her with all other ADLs        Hand Dominance   Dominant Hand: Right    Extremity/Trunk Assessment   Upper Extremity Assessment Upper Extremity Assessment: Defer to OT evaluation;Generalized weakness    Lower Extremity Assessment Lower Extremity Assessment: Generalized weakness(pt slow to move BLE, generally 3-/5)    Cervical / Trunk Assessment Cervical / Trunk Assessment: Kyphotic  Communication   Communication: No difficulties  Cognition Arousal/Alertness: Awake/alert Behavior During Therapy: WFL for tasks assessed/performed Overall Cognitive Status: History of cognitive impairments - at baseline                                 General Comments: pt generally functional today, redirectable and cooperative. follows commands well      General Comments General comments (skin integrity, edema, etc.): pt reports pain using BSC but agreeable to use in future due to increased success with moving bowels, pt reports sig improvement in constipation pain    Exercises     Assessment/Plan    PT Assessment Patient needs continued PT services  PT Problem List Decreased strength;Decreased mobility;Decreased range of motion;Decreased coordination;Decreased activity tolerance;Decreased knowledge of  use of DME;Decreased balance;Decreased cognition       PT Treatment Interventions DME instruction;Therapeutic exercise;Gait training;Balance training;Stair training;Neuromuscular re-education;Functional mobility training;Therapeutic activities;Patient/family education;Cognitive remediation    PT Goals (Current goals can be found in the Care Plan section)  Acute Rehab PT Goals Patient Stated Goal: to return home with family PT Goal Formulation: With patient Time For Goal Achievement: 02/27/20 Potential to Achieve Goals: Good    Frequency Min 4X/week   Barriers to discharge        Co-evaluation PT/OT/SLP Co-Evaluation/Treatment: Yes Reason for Co-Treatment: Complexity of the patient's impairments (multi-system involvement);Necessary to address cognition/behavior during functional activity;For patient/therapist safety PT goals addressed during session: Mobility/safety with mobility;Balance         AM-PAC PT "6 Clicks" Mobility  Outcome Measure Help needed turning from your back to your side while in a flat bed without using bedrails?: A Little Help needed moving from lying on your back to sitting on the side of a flat bed without using bedrails?: A Little Help needed moving to and from a bed to a chair (including a wheelchair)?: A Lot Help needed standing up from a chair using your arms (e.g., wheelchair or bedside chair)?: A Little Help needed to walk in hospital room?: A Lot Help needed climbing 3-5 steps with a railing? : Total 6 Click Score: 14    End of Session Equipment Utilized During Treatment: Gait belt Activity Tolerance: Patient limited by fatigue;Patient tolerated treatment well Patient left: in chair;with  call bell/phone within reach;with chair alarm set Nurse Communication: Mobility status PT Visit Diagnosis: Other abnormalities of gait and mobility (R26.89);Difficulty in walking, not elsewhere classified (R26.2);Muscle weakness (generalized) (M62.81)    Time:  0944-1004(additional time spent 0840 - 8264) PT Time Calculation (min) (ACUTE ONLY): 20 min   Charges:   PT Evaluation $PT Eval Moderate Complexity: 1 Mod PT Treatments $Gait Training: 8-22 mins        Rolm Baptise, PT, DPT   Acute Rehabilitation Department Pager #: [phone]  Gaetana Michaelis 02/13/2020, 12:57 PM

## 2020-02-13 NOTE — Progress Notes (Addendum)
STROKE TEAM PROGRESS NOTE   HISTORY OF PRESENT ILLNESS (per record) Amber [name] is a 64 y.o. female with a history of recent stroke which was from presumed embolic source, but unclear what that source was.  She was in her normal state of health until today when she noticed around 11 AM that she was having worsening dysarthria.  She came into the emergency department where she was triaged and symptoms of neglect were noted and therefore a code stroke was activated. She was recently admitted for a right-sided stroke which left her with the neglect. LKW: 11 AM tpa given?: no, outside of window   INTERVAL HISTORY The MR/MRV pelvis was aborted due to inability to get IV line.  She is complaining of mild dysarthria and severe OA pain of various joints longstanding.   OBJECTIVE Vitals:   02/13/20 0623 02/13/20 0743 02/13/20 0924 02/13/20 1231  BP: 116/88 (!) 129/98 (!) 127/96   Pulse:  91    Resp: 16 16 14    Temp: 98.3 F (36.8 C) 98.1 F (36.7 C)    TempSrc: Oral Oral    SpO2: 100% 100%  100%  Weight:      Height:        CBC:  Recent Labs  Lab 02/12/20 1842 02/12/20 1842 02/12/20 1910 02/13/20 0503  WBC 8.3  --   --  8.6  NEUTROABS 4.5  --   --   --   HGB 7.9*   < > 9.2* 7.6*  HCT 24.2*   < > 27.0* 23.1*  MCV 86.1  --   --  82.8  PLT 115*  --   --  83*   < > = values in this interval not displayed.    Basic Metabolic Panel:  Recent Labs  Lab 02/12/20 1842 02/12/20 1842 02/12/20 1910 02/13/20 0503  NA 146*   < > 144 145  K 5.9*   < > 4.6 3.5  CL 118*   < > 116* 113*  CO2 22  --   --  24  GLUCOSE 105*   < > 98 96  BUN 17   < > 24* 17  CREATININE 0.95   < > 0.90 0.86  CALCIUM 8.4*  --   --  8.4*   < > = values in this interval not displayed.    Lipid Panel:     Component Value Date/Time   CHOL 120 12/25/2019 0359   TRIG 111 12/25/2019 0359   HDL 52 12/25/2019 0359   CHOLHDL 2.3 12/25/2019 0359   VLDL 22 12/25/2019 0359   LDLCALC 46 12/25/2019 0359    HgbA1c:  Lab Results  Component Value Date   HGBA1C 4.4 (L) 12/25/2019   Urine Drug Screen:     Component Value Date/Time   LABOPIA POSITIVE (A) 12/25/2019 0510   COCAINSCRNUR NONE DETECTED 12/25/2019 0510   LABBENZ NONE DETECTED 12/25/2019 0510   AMPHETMU NONE DETECTED 12/25/2019 0510   THCU NONE DETECTED 12/25/2019 0510   LABBARB NONE DETECTED 12/25/2019 0510    Alcohol Level     Component Value Date/Time   ETH <10 02/12/2020 1842    IMAGING  MR ANGIO HEAD WO CONTRAST 02/12/2020 IMPRESSION:  1. Stable intracranial MRA since last month:  2. Patent anterior circulation with no hemodynamically significant stenosis.  3. 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   MR ANGIO NECK W WO CONTRAST 02/12/2020 IMPRESSION:  1. Tortuous cervical carotid arteries with mild atherosclerosis and no significant stenosis.  2. Limited evaluation of the cervical vertebral arteries: The right is dominant, and both are patent to the skull base but stenosis at the vertebral artery origins cannot be excluded.   MR BRAIN WO CONTRAST 02/12/2020 IMPRESSION:  1. Several punctate acute infarcts in the left parietal lobe. No associated hemorrhage or mass effect.  2. Otherwise stable underlying very advanced chronic ischemic disease, including the posterior right MCA territory infarct from last month.   CT HEAD CODE STROKE WO CONTRAST 02/12/2020 IMPRESSION:  1. No acute cortically based infarct or acute intracranial hemorrhage identified.  2. Stable CT appearance of advanced brain ischemia since last month. Expected evolution of the posterior right MCA infarct.  3. ASPECTS 10 when allowing for chronic changes.   Transthoracic Echocardiogram  12/25/2019 IMPRESSIONS  1. Left ventricular ejection fraction, by visual estimation, is 60 to  65%. The left ventricle has normal function. There is  moderately increased  left ventricular hypertrophy.  2. Definity contrast agent was given IV to delineate the left ventricular  endocardial borders.  3. Left ventricular diastolic parameters are consistent with Grade I  diastolic dysfunction (impaired relaxation).  4. The left ventricle has no regional wall motion abnormalities.  5. Global right ventricle has normal systolic function.The right  ventricular size is normal. No increase in right ventricular wall  thickness.  6. Left atrial size was normal.  7. Right atrial size was normal.  8. Mild mitral annular calcification.  9. Moderate calcification of the mitral valve leaflet(s).  10. Moderate thickening of the mitral valve leaflet(s).  11. The mitral valve is normal in structure. Trivial mitral valve  regurgitation. No evidence of mitral stenosis.  12. The tricuspid valve is normal in structure.  13. The tricuspid valve is normal in structure. Tricuspid valve  regurgitation is not demonstrated.  14. The aortic valve is tricuspid. Aortic valve regurgitation is not  visualized. Mild aortic valve sclerosis without stenosis.  15. The pulmonic valve was normal in structure. Pulmonic valve  regurgitation is not visualized.  16. The inferior vena cava is normal in size with greater than 50%  respiratory variability, suggesting right atrial pressure of 3 mmHg.  17. The interatrial septum was not well visualized.   ECG - SR rate 91 BPM. (See cardiology reading for complete details)  PHYSICAL EXAM Blood pressure (!) 127/96, pulse 91, temperature 98.1 F (36.7 C), temperature source Oral, resp. rate 14, height 5' (1.524 m), weight 45.6 kg, SpO2 100 %.  Awake, alert, oriented No significant dysarthria to my view No major focal deficits, but movements impaired by OA pain.     ASSESSMENT/PLAN [name] is a 64 y.o. female with history of CAD, asthma, remote seizure history, Htn, recent embolic stroke, hx of a small  PFO, and DM, presenting with worsening dysarthria. She did not receive IV t-PA due to late presentation (>4.5 hours from time of onset).    Stroke: several punctate acute infarcts in the left parietal lobe - embolic - unknown source.  Resultant  dysarthria  Code Stroke CT Head - No acute cortically based infarct or acute intracranial hemorrhage identified. Expected evolution of the posterior right MCA infarct. ASPECTS 10 when allowing for chronic changes.   CT head - not ordered  MRI head - several punctate acute infarcts in the left parietal lobe. No associated hemorrhage or mass effect. Otherwise stable underlying very advanced chronic ischemic disease.  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   MRA Neck - Tortuous cervical carotid arteries with mild atherosclerosis and no significant stenosis. Limited evaluation of the cervical vertebral arteries:  CTA H&N - not ordered  CT Perfusion - not ordered  Carotid Doppler - MRA neck performed - carotid dopplers not indicated.  2D Echo - 12/25/19 - EF 60 - 65%. No cardiac source of emboli identified.   Sars Corona Virus 2 - negative  LDL - 12/25/19 - 46  HgbA1c - 12/25/19 - 4.4  UDS - pending  VTE prophylaxis - Eliquis Diet  Diet Order            Diet Heart Room service appropriate? Yes; Fluid consistency: Thin  Diet effective now              Eliquis (apixaban) daily prior to admission, now on Eliquis (apixaban) daily  Patient counseled to be compliant with her antithrombotic medications  Ongoing aggressive stroke risk factor management  Therapy recommendations:  HH PT and OT recommended  Disposition:  Pending  Hypertension  Home BP meds: metoprolol  Current BP meds: Labetalol prn  Stable . Permissive hypertension (OK if < 220/120) but gradually normalize in 5-7 days  . Long-term BP goal normotensive  Hyperlipidemia  Home Lipid lowering medication: Lipitor 10 mg  daily  LDL 46, goal < 70  Current lipid lowering medication: Lipitor 10 mg daily   Continue statin at discharge  Diabetes  Home diabetic meds: none   Current diabetic meds: none  HgbA1c 4.4, goal < 7.0 Recent Labs    02/12/20 2304 02/13/20 0605  GLUCAP 79 97    Other Stroke Risk Factors  Advanced age  Former cigarette smoker - quit  Hx stroke/TIA  Coronary artery disease  Other Active Problems  Code status - Full code  Anemia - Hb 9.2->7.6  Thrombocytopenia - Plts - 115->83  Hx of a small PFO  Seizure hx - Valproic acid   Hospital day # 0  Impression:   Couple of tiny infarcts in the left fronto-parietal area.  Old right frontal and right parietal cortical infarcts.  It is embolic but source has not been clear.  Tele shows NSR and no PAF identified in the past, although she has never had a implanted loop recorder.  TEE in 2017 showed PFO and she did have a DVT in RUE at one point, so parodoxical embolism is possible.  However, U/S was negative for DVT this admission.  She is on low dose Eliquis for DVT and I will increase that to 5 mg bid.  We can do an implanted loop recorder, but since already anticoagulated it would not change our management if it was abnormal.    Weston Settle, MS, MD  To contact Stroke Continuity provider, please refer to [email or web address]. After hours, contact General Neurology

## 2020-02-13 NOTE — Progress Notes (Signed)
^  64 year old AA female admitted from home throuhgh  [facility] ED care of slurred speech and left side weakness  Hx of previous CVA  2 moths ago and 2017. Pt alert awake and oriented x 4  Move all extremities with some limitation to LLE and hands r/t arthritis pain..    Pupils reactive to light.  Oriented to room and use of call light . POC explained to pt  BP high   Cardiac monitor applied and verification completed SR   No acute distress. Safety and fall  Precaution ' contact precaution maintained. RN will cont to monitor.  Bp closely.

## 2020-02-13 NOTE — Progress Notes (Signed)
Progress Note    Amber [name]  KGM:[id number] DOB: 1956-10-06  DOA: 02/12/2020 PCP: [name], MD    Brief Narrative:   Chief complaint: dysarthria  Medical records reviewed and are as summarized below:  Amber [name] is an 64 y.o. 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  Of note patient had a recurrent stroke last month suspected to be embolic and reported ongoing left-sided deficits but felt that her speech had worsened.  Initial evaluation in the emergency department included a CT of the head revealing negative for acute intracranial abnormality but noted for expected evolution of posterior right MCA.  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  Assessment/Plan:   Principal Problem:   Acute ischemic stroke (HCC) Active Problems:   PFO (patent foramen ovale)   Hyperkalemia   Seizures (HCC)   Chronic diastolic (congestive) heart failure (HCC)   History of CVA with residual deficit   Chronic anemia   Rheumatoid arthritis (HCC)   #1.  Acute ischemic stroke.  MRI shows several acute punctate infarctions in the left parietal lobe, she also has a history of multiple CVAs including right MCA infarct last month suspected to be embolic and had PFO on TEE in 2017, medications include Eliquis.  Echo last month with an EF of 60 to 65% and 1 diastolic dysfunction.  Hemoglobin A1c 4.4 last month.  Patient feels like she is at her baseline.  Evaluated by PT and OT  who also opined she is close to her baseline and notes that she has 24/7 care at home.  Between her left-sided weakness from her stroke and her rheumatoid arthritis patient is quite debilitated.  Speech was clear on my exam. -Continue Eliquis -Continue statin -Await neuro's recommendations  #2.  Hypertension, fair control.  Home medications include Lasix, metoprolol -Hold home medications for now -Monitor blood pressure closely  #3.  Rheumatoid arthritis, home medications include prednisone 5 mg every day as well as as needed oxycodone.  Stable at baseline -Continue home meds  #4.  Chronic diastolic heart failure.  She complained of worsening lower extremity edema on admission and was provided with 40 mg of Lasix IV.  I's and O's indicate -1 L.  Reports improvement in her edema. -Continue Lasix at her home dose -Monitor intake and output -Obtain daily weights  #5.  History of seizures.  No seizure activity. -Continue home meds   Family Communication/Anticipated D/C date and plan/Code Status   DVT prophylaxis: Eliquis ordered. Code Status: Full Code.  Family Communication: Patient Disposition Plan: Home when ready   Medical Consultants:    Stroke team   Anti-Infectives:    None  Subjective:   Awake alert oriented x3.  Complains of hunger and requesting assistance with eating.  Objective:    Vitals:   02/13/20 0623 02/13/20 0743 02/13/20 0924 02/13/20 1231  BP: 116/88 (!) 129/98 (!) 127/96   Pulse:  91    Resp: 16 16 14    Temp: 98.3 F (36.8 C) 98.1 F (36.7 C)    TempSrc: Oral Oral  SpO2: 100% 100%  100%  Weight:      Height:        Intake/Output Summary (Last 24 hours) at 02/13/2020 1339 Last data filed at 02/13/2020 0756 Gross per 24 hour  Intake 360 ml  Output 2050 ml  Net -1690 ml   Filed Weights   02/12/20 1841 02/12/20 2248  Weight: 47.6 kg 45.6 kg    Exam: General.  Awake alert no acute distress CV: Regular rate and rhythm no murmur  gallop or rub Respiratory: No increased work of breathing with conversation.  Breath sounds are distant but clear bilaterally Musculoskeletal: Arthritic joint changes in both hands otherwise joints without swelling/erythema GI: Nondistended soft positive bowel sounds nontender to palpation Neuro: Awake alert oriented x3.  No aphasia.  No dysarthria tongue midline  Data Reviewed:   I have personally reviewed following labs and imaging studies:  Labs: Labs show the following:   Basic Metabolic Panel: Recent Labs  Lab 02/12/20 1842 02/12/20 1842 02/12/20 1910 02/13/20 0503  NA 146*  --  144 145  K 5.9*   < > 4.6 3.5  CL 118*  --  116* 113*  CO2 22  --   --  24  GLUCOSE 105*  --  98 96  BUN 17  --  24* 17  CREATININE 0.95  --  0.90 0.86  CALCIUM 8.4*  --   --  8.4*   < > = values in this interval not displayed.   GFR Estimated Creatinine Clearance: 49.3 mL/min (by C-G formula based on SCr of 0.86 mg/dL). Liver Function Tests: Recent Labs  Lab 02/12/20 1842 02/13/20 0503  AST 58* 16  ALT 12 15  ALKPHOS 62 64  BILITOT 0.8 0.7  PROT 5.2* 5.2*  ALBUMIN 2.3* 2.2*   No results for input(s): LIPASE, AMYLASE in the last 168 hours. No results for input(s): AMMONIA in the last 168 hours. Coagulation profile No results for input(s): INR, PROTIME in the last 168 hours.  CBC: Recent Labs  Lab 02/12/20 1842 02/12/20 1910 02/13/20 0503  WBC 8.3  --  8.6  NEUTROABS 4.5  --   --   HGB 7.9* 9.2* 7.6*  HCT 24.2* 27.0* 23.1*  MCV 86.1  --  82.8  PLT 115*  --  83*   Cardiac Enzymes: No results for input(s): CKTOTAL, CKMB, CKMBINDEX, TROPONINI in the last 168 hours. BNP (last 3 results) No results for input(s): PROBNP in the last 8760 hours. CBG: Recent Labs  Lab 02/12/20 2304 02/13/20 0605  GLUCAP 79 97   D-Dimer: No results for input(s): DDIMER in the last 72 hours. Hgb A1c: No results for input(s): HGBA1C in the last 72 hours. Lipid Profile: No results for  input(s): CHOL, HDL, LDLCALC, TRIG, CHOLHDL, LDLDIRECT in the last 72 hours. Thyroid function studies: No results for input(s): TSH, T4TOTAL, T3FREE, THYROIDAB in the last 72 hours.  Invalid input(s): FREET3 Anemia work up: No results for input(s): VITAMINB12, FOLATE, FERRITIN, TIBC, IRON, RETICCTPCT in the last 72 hours. Sepsis Labs: Recent Labs  Lab 02/12/20 1842 02/13/20 0503  WBC 8.3 8.6    Microbiology Recent Results (from the past 240 hour(s))  SARS CORONAVIRUS 2 (TAT 6-24 HRS) Nasopharyngeal Nasopharyngeal Swab     Status: None   Collection Time: 02/12/20  8:14 PM   Specimen: Nasopharyngeal Swab  Result Value Ref Range Status   SARS Coronavirus 2 NEGATIVE NEGATIVE Final    Comment: (NOTE) SARS-CoV-2 target nucleic acids are NOT DETECTED. The  SARS-CoV-2 RNA is generally detectable in upper and lower respiratory specimens during the acute phase of infection. Negative results do not preclude SARS-CoV-2 infection, do not rule out co-infections with other pathogens, and should not be used as the sole basis for treatment or other patient management decisions. Negative results must be combined with clinical observations, patient history, and epidemiological information. The expected result is Negative. Fact Sheet for Patients: SugarRoll.be Fact Sheet for Healthcare Providers: https://www.woods-mathews.com/ This test is not yet approved or cleared by the [place] FDA and  has been authorized for detection and/or diagnosis of SARS-CoV-2 by FDA under an Emergency Use Authorization (EUA). This EUA will remain  in effect (meaning this test can be used) for the duration of the COVID-19 declaration under Section 56 4(b)(1) of the Act, 21 U.S.C. section 360bbb-3(b)(1), unless the authorization is terminated or revoked sooner. Performed at [facility], [place] [address], [place], [place] [address]     Procedures and diagnostic  studies:  MR ANGIO HEAD WO CONTRAST  Result Date: 02/12/2020 CLINICAL DATA:  64 year old female code stroke presentation with left side weakness and slurred speech. 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 EXAM: MRA HEAD WITHOUT CONTRAST TECHNIQUE: Angiographic images of the Circle of Willis were obtained using MRA technique without intravenous contrast. COMPARISON:  Neck MRA and brain MRI today reported separately. Intracranial MRA 12/25/2019. FINDINGS: Diminutive posterior circulation with stable antegrade flow in the distal right vertebral artery and basilar. The left V4 segment appears to be severely stenotic, unchanged. Dominant left AICA suspected. Patent right PICA origin. Fetal type bilateral PCA origins. SCA origins remain patent. Bilateral PCA branches are stable without significant stenosis. Stable antegrade flow in both ICA siphons. Normal posterior communicating artery origins. Patent carotid termini. Normal MCA and ACA origins. No siphon stenosis. Stable bilateral MCA and ACA branches with no hemodynamically significant stenosis. IMPRESSION: 1. Stable intracranial MRA since last month: 2. Patent anterior circulation with no hemodynamically significant stenosis. 3. 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 Electronically Signed   By: Genevie Ann M.D.   On: 02/12/2020 20:50   MR ANGIO NECK W WO CONTRAST  Result Date: 02/12/2020 CLINICAL DATA:  64 year old female code stroke presentation with left side weakness and slurred speech. 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 EXAM: MRA NECK WITHOUT AND WITH CONTRAST TECHNIQUE: Multiplanar and multiecho pulse sequences of the neck were obtained without and with intravenous contrast. Angiographic images of the neck were obtained using MRA technique without and with intravenous contrast. CONTRAST:  4.35mL GADAVIST GADOBUTROL 1 MMOL/ML IV SOLN COMPARISON:  Noncontrast head  CT earlier today. [facility] Neck MRA 11/12/2018 FINDINGS: Precontrast time-of-flight neck MRA reveals antegrade flow in both cervical carotid and vertebral arteries. Tortuous great vessels and proximal left CCA. Post-contrast neck MRA images reveal a 3 vessel arch configuration. Tortuous proximal great vessels. Tortuous right CCA without stenosis. Patent right carotid bifurcation. Tortuous right ICA distal to the bulb, no stenosis. Tortuous left CCA without stenosis. Patent left carotid bifurcation with evidence of chronic anterior plaque but no cervical left ICA stenosis. Limited detail of both vertebral artery origins. The right vertebral artery appears dominant. Both vertebral arteries are patent to the skull base and appear stable since 2019. IMPRESSION: 1. Tortuous cervical carotid arteries with mild atherosclerosis and no significant stenosis. 2. Limited evaluation of the cervical vertebral arteries: The right is dominant, and both are  patent to the skull base but stenosis at the vertebral artery origins cannot be excluded. Electronically Signed   By: Odessa Fleming M.D.   On: 02/12/2020 20:37   MR BRAIN WO CONTRAST  Result Date: 02/12/2020 CLINICAL DATA:  64 year old female code stroke presentation with left side weakness and slurred speech. 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 EXAM: MRI HEAD WITHOUT CONTRAST TECHNIQUE: Multiplanar, multiecho pulse sequences of the brain and surrounding structures were obtained without intravenous contrast. COMPARISON:  Brain MRI 12/25/2019. Neck MRA today. FINDINGS: Brain: There are 2 or 3 punctate foci of restricted diffusion in the posterior and medial left parietal lobe (series 5, image 78). No other convincing restricted diffusion. Previous bilateral MCA territory infarcts, more extensive on the right. Chronic right PCA territory infarct. Chronic and developing encephalomalacia in both hemispheres. Patchy chronic bilateral cerebellar  infarcts. Chronic lacunar infarcts in the bilateral deep gray nuclei, especially both medial thalami. Confluent superimposed bilateral cerebral white matter T2 and FLAIR hyperintensity. Scattered hemosiderin, mostly associated with the prior cortical infarcts. No midline shift, mass effect, evidence of mass lesion, ventriculomegaly, extra-axial collection or acute intracranial hemorrhage. Cervicomedullary junction and pituitary are within normal limits. Vascular: Major intracranial vascular flow voids are stable since February. Skull and upper cervical spine: Widespread cervical spine degeneration with evidence of mild spinal stenosis. Normal bone marrow signal. Sinuses/Orbits: Stable, negative. Other: Mastoids remain clear. Visible internal auditory structures appear normal. IMPRESSION: 1. Several punctate acute infarcts in the left parietal lobe. No associated hemorrhage or mass effect. 2. Otherwise stable underlying very advanced chronic ischemic disease, including the posterior right MCA territory infarct from last month. Electronically Signed   By: Odessa Fleming M.D.   On: 02/12/2020 20:41   CT HEAD CODE STROKE WO CONTRAST  Result Date: 02/12/2020 CLINICAL DATA:  Code stroke. 64 year old female left side weakness and slurred speech. Status post confluent posterior right MCA territory infarct last month. Last seen normal 1100 hours today. EXAM: CT HEAD WITHOUT CONTRAST TECHNIQUE: Contiguous axial images were obtained from the base of the skull through the vertex without intravenous contrast. COMPARISON:  Brain MRI 12/25/2019, head CT 12/24/2019. FINDINGS: Brain: Expected evolution of the posterior right MCA territory infarct since last month. Superimposed chronic encephalomalacia of the right motor strip tracking to the operculum, and posterosuperior right frontal gyrus. Small area of left superior frontal gyrus cortical encephalomalacia is stable. Superimposed Patchy and confluent bilateral cerebral white matter  hypodensity. Chronic bilateral deep gray matter lacunar infarcts. Small chronic bilateral cerebellar infarcts. Stable gray-white matter differentiation throughout the brain. No cortically based acute infarct identified. No acute intracranial hemorrhage identified. No midline shift, mass effect, or evidence of intracranial mass lesion. No ventriculomegaly. Vascular: Extensive ICA siphon calcified plaque.No suspicious intracranial vascular hyperdensity. Skull: Osteopenia.  No acute osseous abnormality identified. Sinuses/Orbits: Visualized paranasal sinuses and mastoids are stable and well pneumatized. Other: No acute orbit or scalp soft tissue finding. ASPECTS [facility] Stroke Program Early CT Score) Total score (0-10 with 10 being normal): 10 (chronic and developing encephalomalacia). IMPRESSION: 1. No acute cortically based infarct or acute intracranial hemorrhage identified. 2. Stable CT appearance of advanced brain ischemia since last month. Expected evolution of the posterior right MCA infarct. 3. ASPECTS 10 when allowing for chronic changes. 4. These results were communicated to Dr. Amada Jupiter at 7:09 pm on 02/12/2020 by text page via the [facility] messaging system. Electronically Signed   By: Odessa Fleming M.D.   On: 02/12/2020 19:10    Medications:   . allopurinol  300 mg Oral Daily  . apixaban  2.5 mg Oral BID  . atorvastatin  10 mg Oral q1800  . calcium-vitamin D  2 tablet Oral Q breakfast  . feeding supplement (ENSURE ENLIVE)  237 mL Oral BID BM  . folic acid  1 mg Oral Daily  . furosemide  40 mg Oral Daily  . lipase/protease/amylase  24,000 Units Oral TID WC  . pantoprazole  40 mg Oral BID  . predniSONE  5 mg Oral Q breakfast  . valproic acid  250 mg Oral TID   Continuous Infusions:   LOS: 0 days   Gwenyth Bender NP  [facility]   How to contact the [facility] Attending or Consulting provider 7A - 7P or covering provider during after hours 7P -7A, for this patient?  1. Check the care team in  [facility] and look for a) attending/consulting [facility] provider listed and b) the [facility] team listed 2. Log into [email or web address] and use [place]'s universal password to access. If you do not have the password, please contact the hospital operator. 3. Locate the [facility] provider you are looking for under [facility] and page to a number that you can be directly reached. 4. If you still have difficulty reaching the provider, please page the [facility] (Director on Call) for the Hospitalists listed on amion for assistance.  02/13/2020, 1:39 PM

## 2020-02-14 LAB — GLUCOSE, CAPILLARY
Glucose-Capillary: 64 mg/dL — ABNORMAL LOW (ref 70–99)
Glucose-Capillary: 97 mg/dL (ref 70–99)
Glucose-Capillary: 115 mg/dL — ABNORMAL HIGH (ref 70–99)

## 2020-02-14 MED ORDER — METOPROLOL SUCCINATE ER 25 MG PO TB24
25.0000 mg | ORAL_TABLET | Freq: Every day | ORAL | Status: DC
  Administered 2020-02-14: 12:00:00 25 mg via ORAL
  Filled 2020-02-14: qty 1

## 2020-02-14 MED ORDER — APIXABAN 5 MG PO TABS: 5.0000 mg | ORAL_TABLET | Freq: Two times a day (BID) | ORAL | 0 refills | Status: AC

## 2020-02-14 NOTE — Progress Notes (Signed)
Patient sent home with personal belongings, clothing. No phone, jewelry or wallet present. Patient and family state these items are with family.

## 2020-02-14 NOTE — Plan of Care (Signed)
Stroke education reviewed with patient and family.

## 2020-02-14 NOTE — Plan of Care (Signed)
  Problem: Education: Goal: Knowledge of disease or condition will improve Outcome: Adequate for Discharge  Patient correctly verbalizes what each of her medications are for.  Problem: Coping: Goal: Will verbalize positive feelings about self Outcome: Adequate for Discharge  Patient agrees to feed self with set up.  Problem: Nutrition: Goal: Adequate nutrition will be maintained Outcome: Adequate for Discharge  Pt feeding self with set up and minimal assistance.  Problem: Clinical Measurements: Goal: Will remain free from infection Outcome: Adequate for Discharge  Patient afebrile this shift.

## 2020-02-14 NOTE — Progress Notes (Signed)
Nsg Discharge Note  Admit Date:  02/12/2020 Discharge date: 02/14/2020   Amber [name] to be D/C'd Home per MD order.  AVS completed.  Copy for chart, and copy for patient signed, and dated. Patient/caregiver able to verbalize understanding.  Discharge Medication: Allergies as of 02/14/2020      Reactions   Ivp Dye [iodinated Diagnostic Agents] Itching   Metrizamide Itching      Medication List    STOP taking these medications   Klor-Con M20 20 MEQ tablet Generic drug: potassium chloride SA   lactulose 10 GM/15ML solution Commonly known as: CHRONULAC     TAKE these medications   acetaminophen 500 MG tablet Commonly known as: TYLENOL Take 2 tablets (1,000 mg total) by mouth every 8 (eight) hours. Notes to patient: This afternoon, 02/14/20.   albuterol 108 (90 Base) MCG/ACT inhaler Commonly known as: VENTOLIN HFA Inhale 2 puffs into the lungs every 4 (four) hours as needed for wheezing or shortness of breath.   allopurinol 300 MG tablet Commonly known as: ZYLOPRIM Take 300 mg by mouth daily. Notes to patient: Tomorrow, 02/15/20.   apixaban 5 MG Tabs tablet Commonly known as: ELIQUIS Take 1 tablet (5 mg total) by mouth 2 (two) times daily. What changed:   medication strength  how much to take Notes to patient: This evening, 02/14/20.   atorvastatin 10 MG tablet Commonly known as: LIPITOR Take 10 mg by mouth daily at 6 PM. Notes to patient: This evening, 02/14/20.   calcium-vitamin D 500-200 MG-UNIT tablet Commonly known as: OSCAL WITH D Take 2 tablets by mouth daily with breakfast. Notes to patient: Tomorrow, 02/15/20.   Creon 24000-76000 units Cpep Generic drug: Pancrelipase (Lip-Prot-Amyl) Take 1 capsule by mouth 3 (three) times daily. Notes to patient: Tonight, with dinner, 02/14/20.   esomeprazole 40 MG capsule Commonly known as: NEXIUM Take 40 mg by mouth 2 (two) times daily before a meal. Notes to patient: Tonight, 02/14/20.   feeding supplement (ENSURE  ENLIVE) Liqd Take 237 mLs by mouth 2 (two) times daily between meals. Notes to patient: This afternoon, 02/14/20.   folic acid 1 MG tablet Commonly known as: FOLVITE Take 1 mg by mouth daily. Notes to patient: Tomorrow, 02/15/20.   furosemide 40 MG tablet Commonly known as: Lasix Take 1 tablet (40 mg total) by mouth daily as needed for fluid or edema (sob).   metoprolol succinate 25 MG 24 hr tablet Commonly known as: TOPROL-XL Take 1 tablet (25 mg total) by mouth daily. Notes to patient: Tomorrow, 02/15/20.   nystatin-triamcinolone ointment Commonly known as: MYCOLOG Apply 1 application topically 2 (two) times daily as needed for rash.   Oxycodone HCl 20 MG Tabs Take 10 mg by mouth every 6 (six) hours as needed (severe pain).   predniSONE 5 MG tablet Commonly known as: DELTASONE Take 1 tablet (5 mg total) by mouth daily with breakfast. Notes to patient: Tomorrow, 02/15/20.   valproic acid 250 MG capsule Commonly known as: DEPAKENE Take 250 mg by mouth 3 (three) times daily. Notes to patient: This afternoon, 02/15/20.       Discharge Assessment: Vitals:   02/14/20 0857 02/14/20 1152  BP: 120/90 (!) 124/93  Pulse:  (!) 114  Resp:    Temp: 99.6 F (37.6 C) 98.7 F (37.1 C)  SpO2:  100%   Skin clean, dry and intact without evidence of skin break down, no evidence of skin tears noted. IV catheter discontinued intact. Site without signs and symptoms of complications - no  redness or edema noted at insertion site, patient denies c/o pain.  Dressing with slight pressure applied.  D/c Instructions-Education: Discharge instructions given to patient/family with verbalized understanding. D/c education completed with patient/family including follow up instructions, medication list, d/c activities limitations if indicated, with other d/c instructions as indicated by MD - patient able to verbalize understanding, all questions fully answered. Patient instructed to return to ED, call  911, or call MD for any changes in condition, stroke symptoms.  Patient escorted via [place], and D/C home via private auto.  Joni Reining, RN 02/14/2020 5:43 PM

## 2020-02-14 NOTE — Progress Notes (Signed)
STROKE TEAM PROGRESS NOTE   HISTORY OF PRESENT ILLNESS (per record) Amber [name] is a 64 y.o. female with a history of recent stroke which was from presumed embolic source, but unclear what that source was.  She was in her normal state of health until today when she noticed around 11 AM that she was having worsening dysarthria.  She came into the emergency department where she was triaged and symptoms of neglect were noted and therefore a code stroke was activated. She was recently admitted for a right-sided stroke which left her with the neglect. LKW: 11 AM tpa given?: no, outside of window   INTERVAL HISTORY The MRV pelvis without contrast does not show DVT in CFV or iliac veins.   She is complaining of severe OA pain of various joints which is longstanding.   OBJECTIVE Vitals:   02/14/20 0400 02/14/20 0700 02/14/20 0800 02/14/20 0857  BP:   95/79 120/90  Pulse:      Resp: 15 18 18    Temp:    99.6 F (37.6 C)  TempSrc:      SpO2:   100%   Weight:      Height:        CBC:  Recent Labs  Lab 02/12/20 1842 02/12/20 1842 02/12/20 1910 02/13/20 0503  WBC 8.3  --   --  8.6  NEUTROABS 4.5  --   --   --   HGB 7.9*   < > 9.2* 7.6*  HCT 24.2*   < > 27.0* 23.1*  MCV 86.1  --   --  82.8  PLT 115*  --   --  83*   < > = values in this interval not displayed.    Basic Metabolic Panel:  Recent Labs  Lab 02/12/20 1842 02/12/20 1842 02/12/20 1910 02/13/20 0503  NA 146*   < > 144 145  K 5.9*   < > 4.6 3.5  CL 118*   < > 116* 113*  CO2 22  --   --  24  GLUCOSE 105*   < > 98 96  BUN 17   < > 24* 17  CREATININE 0.95   < > 0.90 0.86  CALCIUM 8.4*  --   --  8.4*   < > = values in this interval not displayed.    Lipid Panel:     Component Value Date/Time   CHOL 120 12/25/2019 0359   TRIG 111 12/25/2019 0359   HDL 52 12/25/2019 0359   CHOLHDL 2.3 12/25/2019 0359   VLDL 22 12/25/2019 0359   LDLCALC 46 12/25/2019 0359   HgbA1c:  Lab Results  Component Value Date    HGBA1C 4.4 (L) 12/25/2019   Urine Drug Screen:     Component Value Date/Time   LABOPIA POSITIVE (A) 12/25/2019 0510   COCAINSCRNUR NONE DETECTED 12/25/2019 0510   LABBENZ NONE DETECTED 12/25/2019 0510   AMPHETMU NONE DETECTED 12/25/2019 0510   THCU NONE DETECTED 12/25/2019 0510   LABBARB NONE DETECTED 12/25/2019 0510    Alcohol Level     Component Value Date/Time   ETH <10 02/12/2020 1842    IMAGING  MR ANGIO HEAD WO CONTRAST 02/12/2020 IMPRESSION:  1. Stable intracranial MRA since last month:  2. Patent anterior circulation with no hemodynamically significant stenosis.  3. 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   MR ANGIO NECK W WO CONTRAST 02/12/2020 IMPRESSION:  1. Tortuous  cervical carotid arteries with mild atherosclerosis and no significant stenosis.  2. Limited evaluation of the cervical vertebral arteries: The right is dominant, and both are patent to the skull base but stenosis at the vertebral artery origins cannot be excluded.   MR BRAIN WO CONTRAST 02/12/2020 IMPRESSION:  1. Several punctate acute infarcts in the left parietal lobe. No associated hemorrhage or mass effect.  2. Otherwise stable underlying very advanced chronic ischemic disease, including the posterior right MCA territory infarct from last month.   CT HEAD CODE STROKE WO CONTRAST 02/12/2020 IMPRESSION:  1. No acute cortically based infarct or acute intracranial hemorrhage identified.  2. Stable CT appearance of advanced brain ischemia since last month. Expected evolution of the posterior right MCA infarct.  3. ASPECTS 10 when allowing for chronic changes.   MR / MRV Pelvis W & WO - pending 02/14/20    Transthoracic Echocardiogram  12/25/2019 IMPRESSIONS  1. Left ventricular ejection fraction, by visual estimation, is 60 to  65%. The left ventricle has normal function. There is  moderately increased  left ventricular hypertrophy.  2. Definity contrast agent was given IV to delineate the left ventricular  endocardial borders.  3. Left ventricular diastolic parameters are consistent with Grade I  diastolic dysfunction (impaired relaxation).  4. The left ventricle has no regional wall motion abnormalities.  5. Global right ventricle has normal systolic function.The right  ventricular size is normal. No increase in right ventricular wall  thickness.  6. Left atrial size was normal.  7. Right atrial size was normal.  8. Mild mitral annular calcification.  9. Moderate calcification of the mitral valve leaflet(s).  10. Moderate thickening of the mitral valve leaflet(s).  11. The mitral valve is normal in structure. Trivial mitral valve  regurgitation. No evidence of mitral stenosis.  12. The tricuspid valve is normal in structure.  13. The tricuspid valve is normal in structure. Tricuspid valve  regurgitation is not demonstrated.  14. The aortic valve is tricuspid. Aortic valve regurgitation is not  visualized. Mild aortic valve sclerosis without stenosis.  15. The pulmonic valve was normal in structure. Pulmonic valve  regurgitation is not visualized.  16. The inferior vena cava is normal in size with greater than 50%  respiratory variability, suggesting right atrial pressure of 3 mmHg.  17. The interatrial septum was not well visualized.   ECG - SR rate 91 BPM. (See cardiology reading for complete details)  PHYSICAL EXAM Blood pressure 120/90, pulse (!) 109, temperature 99.6 F (37.6 C), resp. rate 18, height 5' (1.524 m), weight 45.6 kg, SpO2 100 %.  Awake, alert, oriented No  dysarthria No major focal deficits, but movements impaired by OA pain.     ASSESSMENT/PLAN [name] is a 64 y.o. female with history of CAD, asthma, remote seizure history, Htn, recent embolic stroke, hx of a small PFO, and DM, presenting with worsening  dysarthria. She did not receive IV t-PA due to late presentation (>4.5 hours from time of onset).    Stroke: several punctate acute infarcts in the left parietal lobe - embolic - unknown source.  Resultant  dysarthria  Code Stroke CT Head - No acute cortically based infarct or acute intracranial hemorrhage identified. Expected evolution of the posterior right MCA infarct. ASPECTS 10 when allowing for chronic changes.   CT head - not ordered  MRI head - several punctate acute infarcts in the left parietal lobe. No associated hemorrhage or mass effect. Otherwise stable underlying very advanced chronic ischemic  disease.  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   MRA Neck - Tortuous cervical carotid arteries with mild atherosclerosis and no significant stenosis. Limited evaluation of the cervical vertebral arteries:  MR / MRV Pelvis W & WO - pending  CTA H&N - not ordered  CT Perfusion - not ordered  Carotid Doppler - MRA neck performed - carotid dopplers not indicated.  2D Echo - 12/25/19 - EF 60 - 65%. No cardiac source of emboli identified.   Sars Corona Virus 2 - negative  LDL - 12/25/19 - 46  HgbA1c - 12/25/19 - 4.4  UDS - opiates  VTE prophylaxis - Eliquis Diet  Diet Order            Diet - low sodium heart healthy        Diet Heart Room service appropriate? Yes; Fluid consistency: Thin  Diet effective now              Eliquis (apixaban) daily prior to admission, now on Eliquis (apixaban) daily  Patient counseled to be compliant with her antithrombotic medications  Ongoing aggressive stroke risk factor management  Therapy recommendations:  HH PT and OT recommended  Disposition:  Pending  Hypertension  Home BP meds: metoprolol  Current BP meds: Labetalol prn  Stable . Permissive hypertension (OK if < 220/120) but gradually normalize in 5-7 days  . Long-term BP goal normotensive  Hyperlipidemia  Home Lipid  lowering medication: Lipitor 10 mg daily  LDL 46, goal < 70  Current lipid lowering medication: Lipitor 10 mg daily   Continue statin at discharge  Diabetes  Home diabetic meds: none   Current diabetic meds: none  HgbA1c 4.4, goal < 7.0 Recent Labs    02/14/20 0621 02/14/20 0650 02/14/20 1119  GLUCAP 64* 97 115*    Other Stroke Risk Factors  Advanced age  Former cigarette smoker - quit  Hx stroke/TIA  Coronary artery disease  Other Active Problems  Code status - Full code  Anemia - Hb 9.2->7.6  Thrombocytopenia - Plts - 115->83  Hx of a small PFO  Seizure hx - Valproic acid   Hospital day # 1  Impression:   Couple of tiny infarcts in the left fronto-parietal area.  Old right frontal and right parietal cortical infarcts.  It is embolic but source has not been clear.  Tele shows NSR and no PAF identified yet, although she has never had a implanted loop recorder.  TEE in 2017 showed PFO and she did have a DVT in RUE at one point, so parodoxical embolism is possible.  However, U/S was negative for DVT and MRV of pelvis negative for DVT this admission.  She is on full dose Eliquis for secondary stroke prevention and tolerating it well.   Consideration can be given to PFO closure as outpatient.  An implanted loop recorder may also be considered but only if deciding whether to continue anticoagulation indefinitely.   Patient is to be discharged today.  She can follow up in stroke clinic in 4-6 weeks.  Weston Settle, MS, MD  To contact Stroke Continuity provider, please refer to [email or web address]. After hours, contact General Neurology

## 2020-02-14 NOTE — Discharge Summary (Signed)
Physician Discharge Summary  Amber [name] HQI:[id number] DOB: [birth year] DOA: 02/12/2020  PCP: Sandi Mariscal, MD  Admit date: 02/12/2020 Discharge date: 02/14/2020  Admitted From: Home Discharge disposition: Home   Recommendations for Outpatient Follow-Up:   1. Cbc, bmp 1 week 2. Home health: Please ensure referral has been made 3. outpatient referral to GI if Hgb continues to drop 4. Patient's Eliquis dose was increased to 5 mg twice daily 5. Follow-up at the stroke clinic in 4 to 6 weeks   Discharge Diagnosis:   Principal Problem:   Acute ischemic stroke [facility]) Active Problems:   Seizures (HCC)   PFO (patent foramen ovale)   History of CVA with residual deficit   Chronic anemia   Rheumatoid arthritis (HCC)   Chronic diastolic (congestive) heart failure (HCC)   Hyperkalemia   CVA (cerebral vascular accident) [facility])    Discharge Condition: Improved.  Diet recommendation: Low sodium, heart healthy  Wound care: None.  Code status: Full.   History of Present Illness:   Amber [name] is a 64 y.o. 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  Patient had another stroke last month, suspected to be embolic, and reports ongoing left-sided deficits, but felt that her dysarthria had worsened this morning.  She reports being in her usual state of health last night and initially felt well this morning but noticed she was having difficulty speaking at around 11 AM.  Patient tells me that her left-sided deficits seem unchanged.  She denies any recent fevers or chills.  She complains of back pain which she describes as an ongoing issue that she manages with oxycodone at home.  She denies chest pain or palpitations.  She reports increased bilateral lower extremity edema recently and takes Lasix for  this as needed at home.  She took a dose of Lasix this morning.   Hospital Course by Problem:   Acute ischemic stroke MRI shows: Couple of tiny infarcts in the left fronto-parietal area.  Old right frontal and right parietal cortical infarcts.  It is embolic but source has not been clear.  Tele shows NSR and no PAF identified yet, although she has never had a implanted loop recorder.  TEE in 2017 showed PFO and she did have a DVT in RUE at one point, so parodoxical embolism is possible.  However, U/S was negative for DVT and MRV of pelvis negative for DVT this admission.   -Eliquis changed from 2.5 mg twice daily to 5 mg twice daily PT OT recommends home health   Medical Consultants:   Neurology   Discharge Exam:   Vitals:   02/14/20 0857 02/14/20 1152  BP: 120/90 (!) 124/93  Pulse:  (!) 114  Resp:    Temp: 99.6 F (37.6 C) 98.7 F (37.1 C)  SpO2:  100%   Vitals:   02/14/20 0700 02/14/20 0800 02/14/20 0857 02/14/20 1152  BP:  95/79 120/90 (!) 124/93  Pulse:    (!) 114  Resp: 18 18    Temp:   99.6 F (37.6 C) 98.7 F (37.1 C)  TempSrc:    Axillary  SpO2:  100%  100%  Weight:      Height:        General exam: Appears calm and comfortable.    The results of significant diagnostics from this hospitalization (including imaging, microbiology, ancillary and  laboratory) are listed below for reference.     Procedures and Diagnostic Studies:   MR ANGIO HEAD WO CONTRAST  Result Date: 02/12/2020 CLINICAL DATA:  64 year old female code stroke presentation with left side weakness and slurred speech. 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 EXAM: MRA HEAD WITHOUT CONTRAST TECHNIQUE: Angiographic images of the Circle of Willis were obtained using MRA technique without intravenous contrast. COMPARISON:  Neck MRA and brain MRI today reported separately. Intracranial MRA 12/25/2019. FINDINGS: Diminutive posterior circulation with stable antegrade flow in the distal right vertebral artery and  basilar. The left V4 segment appears to be severely stenotic, unchanged. Dominant left AICA suspected. Patent right PICA origin. Fetal type bilateral PCA origins. SCA origins remain patent. Bilateral PCA branches are stable without significant stenosis. Stable antegrade flow in both ICA siphons. Normal posterior communicating artery origins. Patent carotid termini. Normal MCA and ACA origins. No siphon stenosis. Stable bilateral MCA and ACA branches with no hemodynamically significant stenosis. IMPRESSION: 1. Stable intracranial MRA since last month: 2. Patent anterior circulation with no hemodynamically significant stenosis. 3. 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 Electronically Signed   By: Odessa Fleming M.D.   On: 02/12/2020 20:50   MR ANGIO NECK W WO CONTRAST  Result Date: 02/12/2020 CLINICAL DATA:  64 year old female code stroke presentation with left side weakness and slurred speech. 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 EXAM: MRA NECK WITHOUT AND WITH CONTRAST TECHNIQUE: Multiplanar and multiecho pulse sequences of the neck were obtained without and with intravenous contrast. Angiographic images of the neck were obtained using MRA technique without and with intravenous contrast. CONTRAST:  4.10mL GADAVIST GADOBUTROL 1 MMOL/ML IV SOLN COMPARISON:  Noncontrast head CT earlier today. [facility] Neck MRA 11/12/2018 FINDINGS: Precontrast time-of-flight neck MRA reveals antegrade flow in both cervical carotid and vertebral arteries. Tortuous great vessels and proximal left CCA. Post-contrast neck MRA images reveal a 3 vessel arch configuration. Tortuous proximal great vessels. Tortuous right CCA without stenosis. Patent right carotid bifurcation. Tortuous right ICA distal to the bulb, no stenosis. Tortuous left CCA without stenosis. Patent left carotid bifurcation with  evidence of chronic anterior plaque but no cervical left ICA stenosis. Limited detail of both vertebral artery origins. The right vertebral artery appears dominant. Both vertebral arteries are patent to the skull base and appear stable since 2019. IMPRESSION: 1. Tortuous cervical carotid arteries with mild atherosclerosis and no significant stenosis. 2. Limited evaluation of the cervical vertebral arteries: The right is dominant, and both are patent to the skull base but stenosis at the vertebral artery origins cannot be excluded. Electronically Signed   By: Odessa Fleming M.D.   On: 02/12/2020 20:37   MR BRAIN WO CONTRAST  Result Date: 02/12/2020 CLINICAL DATA:  64 year old female code stroke presentation with left side weakness and slurred speech. 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 EXAM: MRI HEAD WITHOUT CONTRAST TECHNIQUE: Multiplanar, multiecho pulse sequences of the brain and surrounding structures were obtained without intravenous contrast. COMPARISON:  Brain MRI 12/25/2019. Neck MRA today. FINDINGS: Brain: There are 2 or 3 punctate foci of restricted diffusion in the posterior and medial left parietal lobe (series 5, image 78). No other convincing restricted diffusion. Previous bilateral MCA territory infarcts, more extensive on the right. Chronic right PCA territory infarct. Chronic and developing encephalomalacia in both hemispheres. Patchy chronic bilateral cerebellar infarcts. Chronic lacunar infarcts in the bilateral deep gray nuclei, especially  both medial thalami. Confluent superimposed bilateral cerebral white matter T2 and FLAIR hyperintensity. Scattered hemosiderin, mostly associated with the prior cortical infarcts. No midline shift, mass effect, evidence of mass lesion, ventriculomegaly, extra-axial collection or acute intracranial hemorrhage. Cervicomedullary junction and pituitary are within normal limits. Vascular: Major intracranial vascular flow voids are stable since February. Skull and upper  cervical spine: Widespread cervical spine degeneration with evidence of mild spinal stenosis. Normal bone marrow signal. Sinuses/Orbits: Stable, negative. Other: Mastoids remain clear. Visible internal auditory structures appear normal. IMPRESSION: 1. Several punctate acute infarcts in the left parietal lobe. No associated hemorrhage or mass effect. 2. Otherwise stable underlying very advanced chronic ischemic disease, including the posterior right MCA territory infarct from last month. Electronically Signed   By: Odessa Fleming M.D.   On: 02/12/2020 20:41   CT HEAD CODE STROKE WO CONTRAST  Result Date: 02/12/2020 CLINICAL DATA:  Code stroke. 64 year old female left side weakness and slurred speech. Status post confluent posterior right MCA territory infarct last month. Last seen normal 1100 hours today. EXAM: CT HEAD WITHOUT CONTRAST TECHNIQUE: Contiguous axial images were obtained from the base of the skull through the vertex without intravenous contrast. COMPARISON:  Brain MRI 12/25/2019, head CT 12/24/2019. FINDINGS: Brain: Expected evolution of the posterior right MCA territory infarct since last month. Superimposed chronic encephalomalacia of the right motor strip tracking to the operculum, and posterosuperior right frontal gyrus. Small area of left superior frontal gyrus cortical encephalomalacia is stable. Superimposed Patchy and confluent bilateral cerebral white matter hypodensity. Chronic bilateral deep gray matter lacunar infarcts. Small chronic bilateral cerebellar infarcts. Stable gray-white matter differentiation throughout the brain. No cortically based acute infarct identified. No acute intracranial hemorrhage identified. No midline shift, mass effect, or evidence of intracranial mass lesion. No ventriculomegaly. Vascular: Extensive ICA siphon calcified plaque.No suspicious intracranial vascular hyperdensity. Skull: Osteopenia.  No acute osseous abnormality identified. Sinuses/Orbits: Visualized  paranasal sinuses and mastoids are stable and well pneumatized. Other: No acute orbit or scalp soft tissue finding. ASPECTS [facility] Stroke Program Early CT Score) Total score (0-10 with 10 being normal): 10 (chronic and developing encephalomalacia). IMPRESSION: 1. No acute cortically based infarct or acute intracranial hemorrhage identified. 2. Stable CT appearance of advanced brain ischemia since last month. Expected evolution of the posterior right MCA infarct. 3. ASPECTS 10 when allowing for chronic changes. 4. These results were communicated to Dr. Amada Jupiter at 7:09 pm on 02/12/2020 by text page via the [facility] messaging system. Electronically Signed   By: Odessa Fleming M.D.   On: 02/12/2020 19:10     Labs:   Basic Metabolic Panel: Recent Labs  Lab 02/12/20 1842 02/12/20 1842 02/12/20 1910 02/13/20 0503  NA 146*  --  144 145  K 5.9*   < > 4.6 3.5  CL 118*  --  116* 113*  CO2 22  --   --  24  GLUCOSE 105*  --  98 96  BUN 17  --  24* 17  CREATININE 0.95  --  0.90 0.86  CALCIUM 8.4*  --   --  8.4*   < > = values in this interval not displayed.   GFR Estimated Creatinine Clearance: 49.3 mL/min (by C-G formula based on SCr of 0.86 mg/dL). Liver Function Tests: Recent Labs  Lab 02/12/20 1842 02/13/20 0503  AST 58* 16  ALT 12 15  ALKPHOS 62 64  BILITOT 0.8 0.7  PROT 5.2* 5.2*  ALBUMIN 2.3* 2.2*   No results for input(s): LIPASE, AMYLASE in  the last 168 hours. No results for input(s): AMMONIA in the last 168 hours. Coagulation profile No results for input(s): INR, PROTIME in the last 168 hours.  CBC: Recent Labs  Lab 02/12/20 1842 02/12/20 1910 02/13/20 0503  WBC 8.3  --  8.6  NEUTROABS 4.5  --   --   HGB 7.9* 9.2* 7.6*  HCT 24.2* 27.0* 23.1*  MCV 86.1  --  82.8  PLT 115*  --  83*   Cardiac Enzymes: No results for input(s): CKTOTAL, CKMB, CKMBINDEX, TROPONINI in the last 168 hours. BNP: Invalid input(s): POCBNP CBG: Recent Labs  Lab 02/13/20 1614 02/13/20 2132  02/14/20 0621 02/14/20 0650 02/14/20 1119  GLUCAP 246* 127* 64* 97 115*   D-Dimer No results for input(s): DDIMER in the last 72 hours. Hgb A1c No results for input(s): HGBA1C in the last 72 hours. Lipid Profile No results for input(s): CHOL, HDL, LDLCALC, TRIG, CHOLHDL, LDLDIRECT in the last 72 hours. Thyroid function studies No results for input(s): TSH, T4TOTAL, T3FREE, THYROIDAB in the last 72 hours.  Invalid input(s): FREET3 Anemia work up No results for input(s): VITAMINB12, FOLATE, FERRITIN, TIBC, IRON, RETICCTPCT in the last 72 hours. Microbiology Recent Results (from the past 240 hour(s))  SARS CORONAVIRUS 2 (TAT 6-24 HRS) Nasopharyngeal Nasopharyngeal Swab     Status: None   Collection Time: 02/12/20  8:14 PM   Specimen: Nasopharyngeal Swab  Result Value Ref Range Status   SARS Coronavirus 2 NEGATIVE NEGATIVE Final    Comment: (NOTE) SARS-CoV-2 target nucleic acids are NOT DETECTED. The SARS-CoV-2 RNA is generally detectable in upper and lower respiratory specimens during the acute phase of infection. Negative results do not preclude SARS-CoV-2 infection, do not rule out co-infections with other pathogens, and should not be used as the sole basis for treatment or other patient management decisions. Negative results must be combined with clinical observations, patient history, and epidemiological information. The expected result is Negative. Fact Sheet for Patients: HairSlick.no Fact Sheet for Healthcare Providers: quierodirigir.com This test is not yet approved or cleared by the [place] FDA and  has been authorized for detection and/or diagnosis of SARS-CoV-2 by FDA under an Emergency Use Authorization (EUA). This EUA will remain  in effect (meaning this test can be used) for the duration of the COVID-19 declaration under Section 56 4(b)(1) of the Act, 21 U.S.C. section 360bbb-3(b)(1), unless the  authorization is terminated or revoked sooner. Performed at [facility], [address] [place], [place] [address]      Discharge Instructions:   Discharge Instructions    Diet - low sodium heart healthy   Complete by: As directed    Discharge instructions   Complete by: As directed    Home health with 24/7 supervision by family If you take the lasix then take 20 meq of the Kdur otherwise do not take BMP/CBC 1 week   Increase activity slowly   Complete by: As directed      Allergies as of 02/14/2020      Reactions   Ivp Dye [iodinated Diagnostic Agents] Itching   Metrizamide Itching      Medication List    STOP taking these medications   Klor-Con M20 20 MEQ tablet Generic drug: potassium chloride SA   lactulose 10 GM/15ML solution Commonly known as: CHRONULAC     TAKE these medications   acetaminophen 500 MG tablet Commonly known as: TYLENOL Take 2 tablets (1,000 mg total) by mouth every 8 (eight) hours. Notes to  patient: This afternoon, 02/14/20.   albuterol 108 (90 Base) MCG/ACT inhaler Commonly known as: VENTOLIN HFA Inhale 2 puffs into the lungs every 4 (four) hours as needed for wheezing or shortness of breath.   allopurinol 300 MG tablet Commonly known as: ZYLOPRIM Take 300 mg by mouth daily. Notes to patient: Tomorrow, 02/15/20.   apixaban 5 MG Tabs tablet Commonly known as: ELIQUIS Take 1 tablet (5 mg total) by mouth 2 (two) times daily. What changed:   medication strength  how much to take Notes to patient: This evening, 02/14/20.   atorvastatin 10 MG tablet Commonly known as: LIPITOR Take 10 mg by mouth daily at 6 PM. Notes to patient: This evening, 02/14/20.   calcium-vitamin D 500-200 MG-UNIT tablet Commonly known as: OSCAL WITH D Take 2 tablets by mouth daily with breakfast. Notes to patient: Tomorrow, 02/15/20.   Creon 24000-76000 units Cpep Generic drug: Pancrelipase (Lip-Prot-Amyl) Take 1 capsule by mouth 3 (three) times  daily. Notes to patient: Tonight, with dinner, 02/14/20.   esomeprazole 40 MG capsule Commonly known as: NEXIUM Take 40 mg by mouth 2 (two) times daily before a meal. Notes to patient: Tonight, 02/14/20.   feeding supplement (ENSURE ENLIVE) Liqd Take 237 mLs by mouth 2 (two) times daily between meals. Notes to patient: This afternoon, 02/14/20.   folic acid 1 MG tablet Commonly known as: FOLVITE Take 1 mg by mouth daily. Notes to patient: Tomorrow, 02/15/20.   furosemide 40 MG tablet Commonly known as: Lasix Take 1 tablet (40 mg total) by mouth daily as needed for fluid or edema (sob).   metoprolol succinate 25 MG 24 hr tablet Commonly known as: TOPROL-XL Take 1 tablet (25 mg total) by mouth daily. Notes to patient: Tomorrow, 02/15/20.   nystatin-triamcinolone ointment Commonly known as: MYCOLOG Apply 1 application topically 2 (two) times daily as needed for rash.   Oxycodone HCl 20 MG Tabs Take 10 mg by mouth every 6 (six) hours as needed (severe pain).   predniSONE 5 MG tablet Commonly known as: DELTASONE Take 1 tablet (5 mg total) by mouth daily with breakfast. Notes to patient: Tomorrow, 02/15/20.   valproic acid 250 MG capsule Commonly known as: DEPAKENE Take 250 mg by mouth 3 (three) times daily. Notes to patient: This afternoon, 02/15/20.      Follow-up Information    [name], MD Follow up in 1 week(s).   Specialty: Internal Medicine Contact information: [address] [place] [address] [phone]            Time coordinating discharge: 35 min  Signed:  Joseph Art DO  [facility] 02/14/2020, 5:25 PM

## 2020-03-24 ENCOUNTER — Inpatient Hospital Stay (HOSPITAL_COMMUNITY)
Admission: EM | Admit: 2020-03-24 | Discharge: 2020-03-27 | DRG: 690 | Disposition: A | Discharge status: Home Health | Payer: Medicaid Other | Attending: Family Medicine | Admitting: Family Medicine

## 2020-03-24 ENCOUNTER — Encounter (HOSPITAL_COMMUNITY): Payer: Self-pay | Admitting: Emergency Medicine

## 2020-03-24 ENCOUNTER — Other Ambulatory Visit: Payer: Self-pay

## 2020-03-24 ENCOUNTER — Emergency Department (HOSPITAL_COMMUNITY): Payer: Medicaid Other

## 2020-03-24 DIAGNOSIS — Z7901 Long term (current) use of anticoagulants: Secondary | ICD-10-CM

## 2020-03-24 DIAGNOSIS — Z87891 Personal history of nicotine dependence: Secondary | ICD-10-CM

## 2020-03-24 DIAGNOSIS — R059 Cough, unspecified: Secondary | ICD-10-CM

## 2020-03-24 DIAGNOSIS — R112 Nausea with vomiting, unspecified: Secondary | ICD-10-CM

## 2020-03-24 DIAGNOSIS — R197 Diarrhea, unspecified: Secondary | ICD-10-CM

## 2020-03-24 DIAGNOSIS — Z8661 Personal history of infections of the central nervous system: Secondary | ICD-10-CM

## 2020-03-24 DIAGNOSIS — Z79891 Long term (current) use of opiate analgesic: Secondary | ICD-10-CM

## 2020-03-24 DIAGNOSIS — M549 Dorsalgia, unspecified: Secondary | ICD-10-CM | POA: Diagnosis present

## 2020-03-24 DIAGNOSIS — E872 Acidosis: Secondary | ICD-10-CM | POA: Diagnosis present

## 2020-03-24 DIAGNOSIS — E875 Hyperkalemia: Secondary | ICD-10-CM | POA: Diagnosis present

## 2020-03-24 DIAGNOSIS — Z91041 Radiographic dye allergy status: Secondary | ICD-10-CM

## 2020-03-24 DIAGNOSIS — Z888 Allergy status to other drugs, medicaments and biological substances status: Secondary | ICD-10-CM

## 2020-03-24 DIAGNOSIS — K219 Gastro-esophageal reflux disease without esophagitis: Secondary | ICD-10-CM | POA: Diagnosis present

## 2020-03-24 DIAGNOSIS — Z7952 Long term (current) use of systemic steroids: Secondary | ICD-10-CM

## 2020-03-24 DIAGNOSIS — Y92239 Unspecified place in hospital as the place of occurrence of the external cause: Secondary | ICD-10-CM | POA: Diagnosis not present

## 2020-03-24 DIAGNOSIS — G40909 Epilepsy, unspecified, not intractable, without status epilepticus: Secondary | ICD-10-CM | POA: Diagnosis present

## 2020-03-24 DIAGNOSIS — I4891 Unspecified atrial fibrillation: Secondary | ICD-10-CM | POA: Diagnosis present

## 2020-03-24 DIAGNOSIS — R296 Repeated falls: Secondary | ICD-10-CM | POA: Diagnosis present

## 2020-03-24 DIAGNOSIS — I2510 Atherosclerotic heart disease of native coronary artery without angina pectoris: Secondary | ICD-10-CM | POA: Diagnosis present

## 2020-03-24 DIAGNOSIS — K5903 Drug induced constipation: Secondary | ICD-10-CM | POA: Diagnosis not present

## 2020-03-24 DIAGNOSIS — N183 Chronic kidney disease, stage 3 unspecified: Secondary | ICD-10-CM | POA: Diagnosis present

## 2020-03-24 DIAGNOSIS — R079 Chest pain, unspecified: Secondary | ICD-10-CM

## 2020-03-24 DIAGNOSIS — Z20822 Contact with and (suspected) exposure to covid-19: Secondary | ICD-10-CM | POA: Diagnosis present

## 2020-03-24 DIAGNOSIS — R6510 Systemic inflammatory response syndrome (SIRS) of non-infectious origin without acute organ dysfunction: Secondary | ICD-10-CM

## 2020-03-24 DIAGNOSIS — Z79899 Other long term (current) drug therapy: Secondary | ICD-10-CM

## 2020-03-24 DIAGNOSIS — Z9049 Acquired absence of other specified parts of digestive tract: Secondary | ICD-10-CM

## 2020-03-24 DIAGNOSIS — Z86718 Personal history of other venous thrombosis and embolism: Secondary | ICD-10-CM

## 2020-03-24 DIAGNOSIS — K860 Alcohol-induced chronic pancreatitis: Secondary | ICD-10-CM | POA: Diagnosis present

## 2020-03-24 DIAGNOSIS — T402X5A Adverse effect of other opioids, initial encounter: Secondary | ICD-10-CM | POA: Diagnosis not present

## 2020-03-24 DIAGNOSIS — R350 Frequency of micturition: Secondary | ICD-10-CM

## 2020-03-24 DIAGNOSIS — I130 Hypertensive heart and chronic kidney disease with heart failure and stage 1 through stage 4 chronic kidney disease, or unspecified chronic kidney disease: Secondary | ICD-10-CM | POA: Diagnosis present

## 2020-03-24 DIAGNOSIS — Q211 Atrial septal defect: Secondary | ICD-10-CM

## 2020-03-24 DIAGNOSIS — N3000 Acute cystitis without hematuria: Secondary | ICD-10-CM

## 2020-03-24 DIAGNOSIS — Z8673 Personal history of transient ischemic attack (TIA), and cerebral infarction without residual deficits: Secondary | ICD-10-CM

## 2020-03-24 DIAGNOSIS — R05 Cough: Secondary | ICD-10-CM

## 2020-03-24 DIAGNOSIS — I5032 Chronic diastolic (congestive) heart failure: Secondary | ICD-10-CM | POA: Diagnosis present

## 2020-03-24 DIAGNOSIS — Z841 Family history of disorders of kidney and ureter: Secondary | ICD-10-CM

## 2020-03-24 DIAGNOSIS — R109 Unspecified abdominal pain: Principal | ICD-10-CM

## 2020-03-24 DIAGNOSIS — M069 Rheumatoid arthritis, unspecified: Secondary | ICD-10-CM | POA: Diagnosis present

## 2020-03-24 DIAGNOSIS — Z8249 Family history of ischemic heart disease and other diseases of the circulatory system: Secondary | ICD-10-CM

## 2020-03-24 DIAGNOSIS — G8929 Other chronic pain: Secondary | ICD-10-CM | POA: Diagnosis present

## 2020-03-24 DIAGNOSIS — E1122 Type 2 diabetes mellitus with diabetic chronic kidney disease: Secondary | ICD-10-CM | POA: Diagnosis present

## 2020-03-24 DIAGNOSIS — N390 Urinary tract infection, site not specified: Principal | ICD-10-CM

## 2020-03-24 DIAGNOSIS — J45909 Unspecified asthma, uncomplicated: Secondary | ICD-10-CM | POA: Diagnosis present

## 2020-03-24 LAB — CBC WITH DIFFERENTIAL/PLATELET
Abs Immature Granulocytes: 0.15 10*3/uL — ABNORMAL HIGH (ref 0.00–0.07)
Basophils Absolute: 0 10*3/uL (ref 0.0–0.1)
Basophils Relative: 0 %
Eosinophils Absolute: 0.2 10*3/uL (ref 0.0–0.5)
Eosinophils Relative: 1 %
HCT: 31.4 % — ABNORMAL LOW (ref 36.0–46.0)
Hemoglobin: 10.3 g/dL — ABNORMAL LOW (ref 12.0–15.0)
Immature Granulocytes: 1 %
Lymphocytes Relative: 23 %
Lymphs Abs: 3 10*3/uL (ref 0.7–4.0)
MCH: 26.4 pg (ref 26.0–34.0)
MCHC: 32.8 g/dL (ref 30.0–36.0)
MCV: 80.5 fL (ref 80.0–100.0)
Monocytes Absolute: 0.9 10*3/uL (ref 0.1–1.0)
Monocytes Relative: 7 %
Neutro Abs: 9 10*3/uL — ABNORMAL HIGH (ref 1.7–7.7)
Neutrophils Relative %: 68 %
Platelets: 108 10*3/uL — ABNORMAL LOW (ref 150–400)
RBC: 3.9 MIL/uL (ref 3.87–5.11)
RDW: 16.6 % — ABNORMAL HIGH (ref 11.5–15.5)
WBC: 13.3 10*3/uL — ABNORMAL HIGH (ref 4.0–10.5)
nRBC: 0 % (ref 0.0–0.2)

## 2020-03-24 LAB — COMPREHENSIVE METABOLIC PANEL
ALT: 24 U/L (ref 0–44)
AST: 42 U/L — ABNORMAL HIGH (ref 15–41)
Albumin: 2.6 g/dL — ABNORMAL LOW (ref 3.5–5.0)
Alkaline Phosphatase: 104 U/L (ref 38–126)
Anion gap: 7 (ref 5–15)
BUN: 17 mg/dL (ref 8–23)
CO2: 25 mmol/L (ref 22–32)
Calcium: 8 mg/dL — ABNORMAL LOW (ref 8.9–10.3)
Chloride: 111 mmol/L (ref 98–111)
Creatinine, Ser: 0.98 mg/dL (ref 0.44–1.00)
GFR calc Af Amer: >60 mL/min (ref >60)
GFR calc non Af Amer: >60 mL/min (ref >60)
Glucose, Bld: 103 mg/dL — ABNORMAL HIGH (ref 70–99)
Potassium: 5.6 mmol/L — ABNORMAL HIGH (ref 3.5–5.1)
Sodium: 143 mmol/L (ref 135–145)
Total Bilirubin: 0.5 mg/dL (ref 0.3–1.2)
Total Protein: 5.7 g/dL — ABNORMAL LOW (ref 6.5–8.1)

## 2020-03-24 LAB — TROPONIN I (HIGH SENSITIVITY): Troponin I (High Sensitivity): 6 ng/L (ref <18)

## 2020-03-24 LAB — LACTIC ACID, PLASMA: Lactic Acid, Venous: 1.5 mmol/L (ref 0.5–1.9)

## 2020-03-24 LAB — LIPASE, BLOOD: Lipase: 20 U/L (ref 11–51)

## 2020-03-24 MED ORDER — ONDANSETRON HCL 4 MG/2ML IJ SOLN
4.0000 mg | Freq: Once | INTRAMUSCULAR | Status: AC
  Administered 2020-03-24: 4 mg via INTRAVENOUS
  Filled 2020-03-24: qty 2

## 2020-03-24 MED ORDER — FENTANYL CITRATE (PF) 100 MCG/2ML IJ SOLN
50.0000 ug | Freq: Once | INTRAMUSCULAR | Status: AC
  Administered 2020-03-24: 50 ug via INTRAMUSCULAR
  Filled 2020-03-24: qty 2

## 2020-03-24 NOTE — ED Notes (Signed)
Blood draw attempt unsuccessful 

## 2020-03-24 NOTE — ED Provider Notes (Signed)
[facility]-EMERGENCY DEPT Provider Note   CSN: [id number] Arrival date & time: 03/24/20  2104     History Chief Complaint  Patient presents with  . Chest Pain    Amber [name] is a 64 y.o. female.  The history is provided by the patient and medical records. No language interpreter was used.  Abdominal Pain Pain location:  Generalized Pain quality: aching, bloating and dull   Pain radiates to:  Chest Pain severity:  Severe Onset quality:  Gradual Duration:  3 days Timing:  Constant Progression:  Worsening Chronicity:  New Relieved by:  Nothing Worsened by:  Nothing Associated symptoms: belching, chest pain, chills, cough, diarrhea, dysuria, fatigue, fever, nausea and vomiting   Associated symptoms: no constipation and no shortness of breath        Past Medical History:  Diagnosis Date  . ACS (acute coronary syndrome) (HCC) 06/12/2015  . Acute on chronic blood loss anemia 11/25/2017  . Arthritis   . Asthma   . 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 Chronic pain   . Constipation   . Diabetes mellitus without complication (HCC)   . Duodenitis   . Embolic stroke (HCC)   . Falls frequently   . Focal seizure (HCC)   . GERD (gastroesophageal reflux disease)   . Hypertension   . Impacted fracture 02/15/2017   right glenoid  . Pneumonia 10/2015  . Seizures (HCC)    last seizure March 2015  . Small bowel obstruction (HCC) 11/25/2017    Patient Active Problem List   Diagnosis Date Noted  . CVA (cerebral vascular accident) (HCC) 02/13/2020  . Hyperkalemia   . Stroke (HCC) 12/25/2019  . Acute ischemic stroke (HCC) 12/24/2019  . Chronic diastolic (congestive) heart failure (HCC) 02/05/2019  . CKD (chronic kidney disease) stage 3, GFR 30-59 ml/min 02/05/2019  . Chronic anticoagulation 02/05/2019  . Palliative care encounter   . Septic shock (HCC) 12/29/2018  . Pressure injury of skin 10/11/2018  . Sepsis due to pneumonia  (HCC) 10/10/2018  . Failure to thrive in adult 10/10/2018  . Rheumatoid arthritis (HCC) 10/10/2018  . Anasarca 10/10/2018  . HCAP (healthcare-associated pneumonia) 09/24/2018  . Acute metabolic encephalopathy 05/09/2018  . Metabolic acidosis 05/09/2018  . Mild persistent asthma 05/09/2018  . 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 Moderate malnutrition (HCC) 05/07/2018  . Nausea & vomiting 05/06/2018  . Chronic anemia 05/06/2018  . PFO (patent foramen ovale) 02/15/2017  . History of CVA with residual deficit 02/15/2017  . Drug-induced hyperglycemia 02/15/2017  . Seizures (HCC) 10/26/2016  . 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 Sepsis (HCC) 02/29/2016  . Abdominal pain 11/07/2015  . Constipation 11/07/2015  . Hypomagnesemia 08/25/2015  . Diabetes mellitus type 2, controlled (HCC) 07/27/2015  . Chronic back pain   . Frequent falls   . Hypertensive heart disease   . Pulmonary hypertension (HCC)   . Gout   . Microcytic anemia 01/26/2015  . History of seizure 01/26/2015    Past Surgical History:  Procedure Laterality Date  . APPENDECTOMY    . BACK SURGERY    . CHOLECYSTECTOMY    . TEE WITHOUT CARDIOVERSION N/A 10/30/2016   Procedure: TRANSESOPHAGEAL ECHOCARDIOGRAM (TEE);  Surgeon: Lars Masson, MD;  Location: [facility] ENDOSCOPY;  Service: Cardiovascular;  Laterality: N/A;     OB History   No obstetric history on file.     Family History  Problem Relation Age of Onset  . Kidney failure Mother   .  Hypertension Sister   . Hypertension Brother     Social History   Tobacco Use  . Smoking status: Former Smoker    Quit date: 2010    Years since quitting: 11.3  . Smokeless tobacco: Never Used  Substance Use Topics  . Alcohol use: No    Comment: admits to 2 drinks/week  . Drug use: No    Home Medications Prior to Admission medications   Medication Sig Start Date End Date Taking? Authorizing Provider  acetaminophen (TYLENOL) 500 MG tablet Take 2  tablets (1,000 mg total) by mouth every 8 (eight) hours. 12/30/19   Mercy Riding, MD  albuterol (PROVENTIL HFA;VENTOLIN HFA) 108 (90 Base) MCG/ACT inhaler Inhale 2 puffs into the lungs every 4 (four) hours as needed for wheezing or shortness of breath. 01/16/19   Aline August, MD  allopurinol (ZYLOPRIM) 300 MG tablet Take 300 mg by mouth daily. 11/11/16   [provider]  apixaban (ELIQUIS) 5 MG TABS tablet Take 1 tablet (5 mg total) by mouth 2 (two) times daily. 02/14/20   Geradine Girt, DO  atorvastatin (LIPITOR) 10 MG tablet Take 10 mg by mouth daily at 6 PM.  08/13/15   [provider]  calcium-vitamin D (OSCAL WITH D) 500-200 MG-UNIT tablet Take 2 tablets by mouth daily with breakfast. 12/30/19   Mercy Riding, MD  CREON 24000-76000 units CPEP Take 1 capsule by mouth 3 (three) times daily.  09/23/19   [provider]  esomeprazole (NEXIUM) 40 MG capsule Take 40 mg by mouth 2 (two) times daily before a meal.  11/26/19   [provider]  folic acid (FOLVITE) 1 MG tablet Take 1 mg by mouth daily. 11/20/19   [provider]  furosemide (LASIX) 40 MG tablet Take 1 tablet (40 mg total) by mouth daily as needed for fluid or edema (sob). 01/02/20 03/02/20  Mercy Riding, MD  metoprolol succinate (TOPROL-XL) 25 MG 24 hr tablet Take 1 tablet (25 mg total) by mouth daily. 10/15/18   Mikhail, Velta Addison, DO  nystatin-triamcinolone ointment (MYCOLOG) Apply 1 application topically 2 (two) times daily as needed for rash. 09/23/19   [provider]  Oxycodone HCl 20 MG TABS Take 10 mg by mouth every 6 (six) hours as needed (severe pain).  12/08/19   [provider]  predniSONE (DELTASONE) 5 MG tablet Take 1 tablet (5 mg total) by mouth daily with breakfast. 01/16/19   Aline August, MD  valproic acid (DEPAKENE) 250 MG capsule Take 250 mg by mouth 3 (three) times daily.     [provider]    Allergies    Ivp dye [iodinated diagnostic agents] and  Metrizamide  Review of Systems   Review of Systems  Constitutional: Positive for chills, fatigue and fever. Negative for diaphoresis.  HENT: Negative for congestion.   Eyes: Negative for visual disturbance.  Respiratory: Positive for cough. Negative for chest tightness, shortness of breath, wheezing and stridor.   Cardiovascular: Positive for chest pain. Negative for palpitations and leg swelling.  Gastrointestinal: Positive for abdominal distention, abdominal pain, diarrhea, nausea and vomiting. Negative for constipation.  Genitourinary: Positive for dysuria. Negative for flank pain.  Musculoskeletal: Negative for back pain, neck pain and neck stiffness.  Skin: Negative for rash and wound.  Neurological: Negative for dizziness, light-headedness and headaches.  Psychiatric/Behavioral: Negative for agitation and confusion.  All other systems reviewed and are negative.   Physical Exam Updated Vital Signs There were no vitals taken for this  visit.  Physical Exam Vitals and nursing note reviewed.  Constitutional:      General: She is not in acute distress.    Appearance: She is well-developed. She is ill-appearing. She is not toxic-appearing or diaphoretic.  HENT:     Head: Normocephalic and atraumatic.  Eyes:     Extraocular Movements: Extraocular movements intact.     Conjunctiva/sclera: Conjunctivae normal.     Pupils: Pupils are equal, round, and reactive to light.  Cardiovascular:     Rate and Rhythm: Regular rhythm. Tachycardia present.     Heart sounds: No murmur.  Pulmonary:     Effort: Pulmonary effort is normal. No tachypnea or respiratory distress.     Breath sounds: Normal breath sounds. No decreased breath sounds, wheezing, rhonchi or rales.  Chest:     Chest wall: Tenderness present.  Abdominal:     General: There is distension.     Tenderness: There is generalized abdominal tenderness. There is no right CVA tenderness, left CVA tenderness, guarding or rebound.    Musculoskeletal:     Cervical back: Neck supple.     Right lower leg: No tenderness. No edema.     Left lower leg: No tenderness. No edema.  Skin:    General: Skin is warm and dry.     Capillary Refill: Capillary refill takes less than 2 seconds.  Neurological:     General: No focal deficit present.     Mental Status: She is alert.     ED Results / Procedures / Treatments   Labs (all labs ordered are listed, but only abnormal results are displayed) Labs Reviewed  CBC WITH DIFFERENTIAL/PLATELET - Abnormal; Notable for the following components:      Result Value   WBC 13.3 (*)    Hemoglobin 10.3 (*)    HCT 31.4 (*)    RDW 16.6 (*)    Platelets 108 (*)    Neutro Abs 9.0 (*)    Abs Immature Granulocytes 0.15 (*)    All other components within normal limits  COMPREHENSIVE METABOLIC PANEL - Abnormal; Notable for the following components:   Potassium 5.6 (*)    Glucose, Bld 103 (*)    Calcium 8.0 (*)    Total Protein 5.7 (*)    Albumin 2.6 (*)    AST 42 (*)    All other components within normal limits  URINE CULTURE  CULTURE, BLOOD (ROUTINE X 2)  CULTURE, BLOOD (ROUTINE X 2)  LACTIC ACID, PLASMA  LACTIC ACID, PLASMA  LIPASE, BLOOD  URINALYSIS, ROUTINE W REFLEX MICROSCOPIC  TROPONIN I (HIGH SENSITIVITY)  TROPONIN I (HIGH SENSITIVITY)    EKG EKG Interpretation  Date/Time:  Wednesday Mar 24 2020 21:27:07 EDT Ventricular Rate:  117 PR Interval:    QRS Duration: 64 QT Interval:  311 QTC Calculation: 434 R Axis:   60 Text Interpretation: Sinus tachycardia Multiform ventricular premature complexes Aberrant conduction of SV complex(es) Probable LVH with secondary repol abnrm When compared to prior, faster rate. No STEMI Confirmed by Theda Belfast ([id number]) on 03/24/2020 9:48:31 PM   Radiology CT ABDOMEN PELVIS WO CONTRAST  Result Date: 03/24/2020 CLINICAL DATA:  Chest discomfort, nausea and vomiting EXAM: CT CHEST, ABDOMEN AND PELVIS WITHOUT CONTRAST TECHNIQUE:  Multidetector CT imaging of the chest, abdomen and pelvis was performed following the standard protocol without IV contrast. COMPARISON:  03/21/2020 FINDINGS: CT CHEST FINDINGS Cardiovascular: Unenhanced imaging of the heart and great vessels demonstrates no pericardial effusion. Normal caliber of the thoracic aorta. Mild  diffuse atherosclerosis. Mediastinum/Nodes: No enlarged mediastinal, hilar, or axillary lymph nodes. Thyroid gland, trachea, and esophagus demonstrate no significant findings. Lungs/Pleura: No acute airspace disease, effusion, or pneumothorax. Central airways are patent. Musculoskeletal: Multiple prior healed right-sided rib fractures. No acute bony abnormalities. Extensive postsurgical changes from thoracic fusion. Prior corpectomy at T4 and T5. CT ABDOMEN PELVIS FINDINGS Hepatobiliary: No focal liver abnormality is seen. Status post cholecystectomy. No biliary dilatation. Pancreas: Diffuse pancreatic atrophy. Calcifications consistent with sequela of chronic calcific pancreatitis. No acute inflammatory changes. Spleen: Normal in size without focal abnormality. Adrenals/Urinary Tract: No urinary tract calculi or obstructive uropathy. The bladder is unremarkable. The adrenals are normal. Stomach/Bowel: Scattered colonic diverticulosis without diverticulitis. No bowel obstruction or ileus. Moderate stool throughout the colon. Vascular/Lymphatic: Aortic atherosclerosis. No enlarged abdominal or pelvic lymph nodes. Reproductive: Uterus and bilateral adnexa are unremarkable. Other: Stable fat containing left inguinal hernia. No abdominal wall hernia. No free fluid or free gas. Musculoskeletal: No acute or destructive bony lesions. Chronic T12, L1, and L3 compression deformities. Reconstructed images demonstrate no additional findings. IMPRESSION: 1. No acute intrathoracic, intra-abdominal, or intrapelvic process. 2. Sequelae of chronic calcific pancreatitis. 3. Stable fat containing left inguinal  hernia. 4. Moderate fecal retention. 5. Aortic Atherosclerosis (ICD10-I70.0). Electronically Signed   By: Sharlet Salina M.D.   On: 03/24/2020 23:07   CT Chest Wo Contrast  Result Date: 03/24/2020 CLINICAL DATA:  Chest discomfort, nausea and vomiting EXAM: CT CHEST, ABDOMEN AND PELVIS WITHOUT CONTRAST TECHNIQUE: Multidetector CT imaging of the chest, abdomen and pelvis was performed following the standard protocol without IV contrast. COMPARISON:  03/21/2020 FINDINGS: CT CHEST FINDINGS Cardiovascular: Unenhanced imaging of the heart and great vessels demonstrates no pericardial effusion. Normal caliber of the thoracic aorta. Mild diffuse atherosclerosis. Mediastinum/Nodes: No enlarged mediastinal, hilar, or axillary lymph nodes. Thyroid gland, trachea, and esophagus demonstrate no significant findings. Lungs/Pleura: No acute airspace disease, effusion, or pneumothorax. Central airways are patent. Musculoskeletal: Multiple prior healed right-sided rib fractures. No acute bony abnormalities. Extensive postsurgical changes from thoracic fusion. Prior corpectomy at T4 and T5. CT ABDOMEN PELVIS FINDINGS Hepatobiliary: No focal liver abnormality is seen. Status post cholecystectomy. No biliary dilatation. Pancreas: Diffuse pancreatic atrophy. Calcifications consistent with sequela of chronic calcific pancreatitis. No acute inflammatory changes. Spleen: Normal in size without focal abnormality. Adrenals/Urinary Tract: No urinary tract calculi or obstructive uropathy. The bladder is unremarkable. The adrenals are normal. Stomach/Bowel: Scattered colonic diverticulosis without diverticulitis. No bowel obstruction or ileus. Moderate stool throughout the colon. Vascular/Lymphatic: Aortic atherosclerosis. No enlarged abdominal or pelvic lymph nodes. Reproductive: Uterus and bilateral adnexa are unremarkable. Other: Stable fat containing left inguinal hernia. No abdominal wall hernia. No free fluid or free gas.  Musculoskeletal: No acute or destructive bony lesions. Chronic T12, L1, and L3 compression deformities. Reconstructed images demonstrate no additional findings. IMPRESSION: 1. No acute intrathoracic, intra-abdominal, or intrapelvic process. 2. Sequelae of chronic calcific pancreatitis. 3. Stable fat containing left inguinal hernia. 4. Moderate fecal retention. 5. Aortic Atherosclerosis (ICD10-I70.0). Electronically Signed   By: Sharlet Salina M.D.   On: 03/24/2020 23:07    Procedures Procedures (including critical care time)  Medications Ordered in ED Medications  fentaNYL (SUBLIMAZE) injection 50 mcg (50 mcg Intramuscular Given 03/24/20 2202)  ondansetron (ZOFRAN) injection 4 mg (4 mg Intravenous Given 03/24/20 2238)  fentaNYL (SUBLIMAZE) injection 50 mcg (50 mcg Intramuscular Given 03/25/20 0101)  sodium chloride 0.9 % bolus 1,000 mL (1,000 mLs Intravenous New Bag/Given 03/25/20 0100)    ED Course  I have  reviewed the triage vital signs and the nursing notes.  Pertinent labs & imaging results that were available during my care of the patient were reviewed by me and considered in my medical decision making (see chart for details).    MDM Rules/Calculators/A&P                      Amber [name] is a 64 y.o. female with a past medical history significant for prior stroke, diabetes, CKD, CHF, seizures, and prior small bowel obstruction who presents with abdominal pain, nausea, vomiting, and diffuse lower chest pain.  Patient reports that she was seen several days ago at another facility and was told she had diverticulitis and urinary tract infection and was started on antibiotics.  She reports that since starting antibiotic she was having worsening abdominal pain as well as some diarrhea.  She is still having nausea and vomiting.  She reports the pain is diffuse and severe and her abdomen is distending.  She says it feels like when she had a bowel obstruction.  She reports they also told her she may  have had a rib fracture although she denies any trauma.  She reports the pain is in her lower chest and across the chest.  She reports chills but no fevers at home.  She reports she is not tolerating p.o. due to this nausea and vomiting.  She has not been able to take the antibiotics well due to this.  She reports no significant headache or neck pain.  She is feeling dehydrated.  She reports she is still having some dysuria.  On exam, lungs are clear and the lower chest is tender to palpation.  Abdomen is diffusely tender and abdomen is tense and distended.  Bowel sounds were appreciated however.  Patient moving all extremities.  Patient is febrile on arrival and is slightly tachycardic.  She is not tachypneic or hypoxic.  She is not hypotensive.  Do not feel patient is septic however I am concerned about worsening urinary tract infection or development of small bowel obstruction.  Also concerned about development of abscess or perforation given the reportedly recent diverticulitis.  Development of abscess or perforation given the reportedly recent diverticulitis.  Given her history of SBO and this new diagnosis of diverticulitis, will get a repeat CT scan to look for abnormalities.  We will also get a chest CT to look for rib fractures given the report that these were seen.  Patient is also having some coughing still.  CT scans returned showing no evidence of acute perforation, abscess, diverticulitis, or obstruction.  Unclear etiology of the patient's abdominal pain however due to the noncontrasted imaging tonight, the previous diverticulitis may have been missed.  On reassessment, patient is still having severe abdominal discomfort and is feeling bad with the fever, tachycardia, and abdominal pain.  She is still having the dysuria.  We will check urinalysis to see if she is having UTI causing the symptoms.  If urinalysis is discovered, patient will likely require admission for borderline urosepsis with  severe abdominal pain with nausea and vomiting and intolerance of p.o.  Do not feel she is going to be able to take oral antibiotics well at home with the emesis.  After urine is completed, anticipate admission if UTI is discovered.  If it is negative, will have a shared decision made conversation with patient about plan of care and will consider discharge if she is able to tolerate eating and drinking and the  ability to take her home antibiotics for the diverticulitis discovered the other day.        Care transferred to Dr. Nicanor Alcon while awaiting for UA to return.    Final Clinical Impression(s) / ED Diagnoses Final diagnoses:  Abdominal pain, unspecified abdominal location  Chest pain, unspecified type  Cough  Nausea vomiting and diarrhea  Urinary frequency    Clinical Impression: 1. Abdominal pain, unspecified abdominal location   2. Chest pain, unspecified type   3. Cough   4. Nausea vomiting and diarrhea   5. Urinary frequency     Disposition: Care transferred to Dr. Nicanor Alcon while awaiting for UA to return.    This note was prepared with assistance of Conservation officer, historic buildings. Occasional wrong-word or sound-a-like substitutions may have occurred due to the inherent limitations of voice recognition software.         Catheryn Slifer, Canary Brim, MD 03/25/20 [phone]

## 2020-03-24 NOTE — ED Triage Notes (Signed)
Pt arrived via EMS from home. EMS reports anterior chest wall pain for the past 3 days. Pt fell 3 days ago and was diagnosed with broken ribs at [facility]. Pt's pain has progressively gotten worse. Pt has hx of A fib. Pt was given fentanyl intranasally. No IV access was secured, pt usually needs IV team for access. Pt was also diagnosed with a UTI at [facility]. Pt has hx of diabetes

## 2020-03-25 DIAGNOSIS — I130 Hypertensive heart and chronic kidney disease with heart failure and stage 1 through stage 4 chronic kidney disease, or unspecified chronic kidney disease: Secondary | ICD-10-CM | POA: Diagnosis present

## 2020-03-25 DIAGNOSIS — N390 Urinary tract infection, site not specified: Principal | ICD-10-CM

## 2020-03-25 DIAGNOSIS — Z87891 Personal history of nicotine dependence: Secondary | ICD-10-CM | POA: Diagnosis not present

## 2020-03-25 DIAGNOSIS — R6510 Systemic inflammatory response syndrome (SIRS) of non-infectious origin without acute organ dysfunction: Secondary | ICD-10-CM

## 2020-03-25 DIAGNOSIS — Z841 Family history of disorders of kidney and ureter: Secondary | ICD-10-CM | POA: Diagnosis not present

## 2020-03-25 DIAGNOSIS — E872 Acidosis: Secondary | ICD-10-CM | POA: Diagnosis present

## 2020-03-25 DIAGNOSIS — G8929 Other chronic pain: Secondary | ICD-10-CM | POA: Diagnosis present

## 2020-03-25 DIAGNOSIS — R197 Diarrhea, unspecified: Secondary | ICD-10-CM

## 2020-03-25 DIAGNOSIS — I4891 Unspecified atrial fibrillation: Secondary | ICD-10-CM | POA: Diagnosis present

## 2020-03-25 DIAGNOSIS — I2510 Atherosclerotic heart disease of native coronary artery without angina pectoris: Secondary | ICD-10-CM | POA: Diagnosis present

## 2020-03-25 DIAGNOSIS — R05 Cough: Secondary | ICD-10-CM | POA: Diagnosis present

## 2020-03-25 DIAGNOSIS — Z91041 Radiographic dye allergy status: Secondary | ICD-10-CM | POA: Diagnosis not present

## 2020-03-25 DIAGNOSIS — I5032 Chronic diastolic (congestive) heart failure: Secondary | ICD-10-CM | POA: Diagnosis present

## 2020-03-25 DIAGNOSIS — Z8249 Family history of ischemic heart disease and other diseases of the circulatory system: Secondary | ICD-10-CM | POA: Diagnosis not present

## 2020-03-25 DIAGNOSIS — Z9049 Acquired absence of other specified parts of digestive tract: Secondary | ICD-10-CM | POA: Diagnosis not present

## 2020-03-25 DIAGNOSIS — R296 Repeated falls: Secondary | ICD-10-CM | POA: Diagnosis present

## 2020-03-25 DIAGNOSIS — Z8661 Personal history of infections of the central nervous system: Secondary | ICD-10-CM | POA: Diagnosis not present

## 2020-03-25 DIAGNOSIS — M069 Rheumatoid arthritis, unspecified: Secondary | ICD-10-CM | POA: Diagnosis present

## 2020-03-25 DIAGNOSIS — K860 Alcohol-induced chronic pancreatitis: Secondary | ICD-10-CM | POA: Diagnosis present

## 2020-03-25 DIAGNOSIS — T402X5A Adverse effect of other opioids, initial encounter: Secondary | ICD-10-CM | POA: Diagnosis not present

## 2020-03-25 DIAGNOSIS — Z8673 Personal history of transient ischemic attack (TIA), and cerebral infarction without residual deficits: Secondary | ICD-10-CM | POA: Diagnosis not present

## 2020-03-25 DIAGNOSIS — R109 Unspecified abdominal pain: Secondary | ICD-10-CM

## 2020-03-25 DIAGNOSIS — Q211 Atrial septal defect: Secondary | ICD-10-CM | POA: Diagnosis not present

## 2020-03-25 DIAGNOSIS — K5903 Drug induced constipation: Secondary | ICD-10-CM | POA: Diagnosis not present

## 2020-03-25 DIAGNOSIS — Y92239 Unspecified place in hospital as the place of occurrence of the external cause: Secondary | ICD-10-CM | POA: Diagnosis not present

## 2020-03-25 DIAGNOSIS — Z7901 Long term (current) use of anticoagulants: Secondary | ICD-10-CM | POA: Diagnosis not present

## 2020-03-25 DIAGNOSIS — Z20822 Contact with and (suspected) exposure to covid-19: Secondary | ICD-10-CM | POA: Diagnosis present

## 2020-03-25 DIAGNOSIS — Z888 Allergy status to other drugs, medicaments and biological substances status: Secondary | ICD-10-CM | POA: Diagnosis not present

## 2020-03-25 DIAGNOSIS — K219 Gastro-esophageal reflux disease without esophagitis: Secondary | ICD-10-CM | POA: Diagnosis present

## 2020-03-25 LAB — URINALYSIS, ROUTINE W REFLEX MICROSCOPIC
Bilirubin Urine: NEGATIVE
Glucose, UA: NEGATIVE mg/dL
Hgb urine dipstick: NEGATIVE
Ketones, ur: NEGATIVE mg/dL
Nitrite: NEGATIVE
Protein, ur: NEGATIVE mg/dL
Specific Gravity, Urine: 1.017 (ref 1.005–1.030)
WBC, UA: >50 WBC/hpf — ABNORMAL HIGH (ref 0–5)
pH: 6 (ref 5.0–8.0)

## 2020-03-25 LAB — BASIC METABOLIC PANEL
Anion gap: 7 (ref 5–15)
BUN: 16 mg/dL (ref 8–23)
CO2: 24 mmol/L (ref 22–32)
Calcium: 7.9 mg/dL — ABNORMAL LOW (ref 8.9–10.3)
Chloride: 113 mmol/L — ABNORMAL HIGH (ref 98–111)
Creatinine, Ser: 0.86 mg/dL (ref 0.44–1.00)
GFR calc Af Amer: >60 mL/min (ref >60)
GFR calc non Af Amer: >60 mL/min (ref >60)
Glucose, Bld: 134 mg/dL — ABNORMAL HIGH (ref 70–99)
Potassium: 4.1 mmol/L (ref 3.5–5.1)
Sodium: 144 mmol/L (ref 135–145)

## 2020-03-25 LAB — CBC
HCT: 27.4 % — ABNORMAL LOW (ref 36.0–46.0)
Hemoglobin: 8.9 g/dL — ABNORMAL LOW (ref 12.0–15.0)
MCH: 26.5 pg (ref 26.0–34.0)
MCHC: 32.5 g/dL (ref 30.0–36.0)
MCV: 81.5 fL (ref 80.0–100.0)
Platelets: 80 10*3/uL — ABNORMAL LOW (ref 150–400)
RBC: 3.36 MIL/uL — ABNORMAL LOW (ref 3.87–5.11)
RDW: 16.6 % — ABNORMAL HIGH (ref 11.5–15.5)
WBC: 11.3 10*3/uL — ABNORMAL HIGH (ref 4.0–10.5)
nRBC: 0 % (ref 0.0–0.2)

## 2020-03-25 LAB — URINE CULTURE: Culture: NO GROWTH

## 2020-03-25 LAB — TROPONIN I (HIGH SENSITIVITY): Troponin I (High Sensitivity): 6 ng/L (ref <18)

## 2020-03-25 LAB — CBG MONITORING, ED: Glucose-Capillary: 75 mg/dL (ref 70–99)

## 2020-03-25 LAB — C DIFFICILE QUICK SCREEN W PCR REFLEX
C Diff antigen: POSITIVE — AB
C Diff toxin: NEGATIVE

## 2020-03-25 LAB — RESPIRATORY PANEL BY RT PCR (FLU A&B, COVID)
Influenza A by PCR: NEGATIVE
Influenza B by PCR: NEGATIVE
SARS Coronavirus 2 by RT PCR: NEGATIVE

## 2020-03-25 LAB — LACTIC ACID, PLASMA: Lactic Acid, Venous: 1.1 mmol/L (ref 0.5–1.9)

## 2020-03-25 LAB — MRSA PCR SCREENING: MRSA by PCR: NEGATIVE

## 2020-03-25 MED ORDER — ALBUTEROL SULFATE (2.5 MG/3ML) 0.083% IN NEBU: 2.5000 mg | INHALATION_SOLUTION | RESPIRATORY_TRACT | Status: DC | PRN

## 2020-03-25 MED ORDER — OXYCODONE HCL 5 MG PO TABS
10.0000 mg | ORAL_TABLET | Freq: Four times a day (QID) | ORAL | Status: DC | PRN
  Administered 2020-03-25 – 2020-03-27 (×9): 10 mg via ORAL
  Filled 2020-03-25 (×9): qty 2

## 2020-03-25 MED ORDER — FENTANYL CITRATE (PF) 100 MCG/2ML IJ SOLN
12.5000 ug | Freq: Once | INTRAMUSCULAR | Status: AC
  Administered 2020-03-25: 12.5 ug via INTRAVENOUS
  Filled 2020-03-25: qty 2

## 2020-03-25 MED ORDER — ALBUTEROL SULFATE HFA 108 (90 BASE) MCG/ACT IN AERS: 2.0000 | INHALATION_SPRAY | RESPIRATORY_TRACT | Status: DC | PRN

## 2020-03-25 MED ORDER — SODIUM CHLORIDE 0.9 % IV SOLN
1.0000 g | Freq: Once | INTRAVENOUS | Status: AC
  Administered 2020-03-25: 1 g via INTRAVENOUS
  Filled 2020-03-25: qty 10

## 2020-03-25 MED ORDER — ONDANSETRON HCL 4 MG/2ML IJ SOLN
4.0000 mg | Freq: Four times a day (QID) | INTRAMUSCULAR | Status: DC | PRN
  Administered 2020-03-27 (×2): 4 mg via INTRAVENOUS
  Filled 2020-03-25 (×2): qty 2

## 2020-03-25 MED ORDER — METOPROLOL SUCCINATE ER 25 MG PO TB24
25.0000 mg | ORAL_TABLET | Freq: Every day | ORAL | Status: DC
  Administered 2020-03-25 – 2020-03-27 (×3): 25 mg via ORAL
  Filled 2020-03-25 (×3): qty 1

## 2020-03-25 MED ORDER — SODIUM CHLORIDE 0.9 % IV SOLN
1.0000 g | INTRAVENOUS | Status: DC
  Administered 2020-03-26: 1 g via INTRAVENOUS
  Filled 2020-03-25: qty 10
  Filled 2020-03-25: qty 1

## 2020-03-25 MED ORDER — APIXABAN 5 MG PO TABS
5.0000 mg | ORAL_TABLET | Freq: Two times a day (BID) | ORAL | Status: DC
  Administered 2020-03-25 – 2020-03-27 (×5): 5 mg via ORAL
  Filled 2020-03-25 (×5): qty 1

## 2020-03-25 MED ORDER — FOLIC ACID 1 MG PO TABS
1.0000 mg | ORAL_TABLET | Freq: Every day | ORAL | Status: DC
  Administered 2020-03-25 – 2020-03-27 (×3): 1 mg via ORAL
  Filled 2020-03-25 (×3): qty 1

## 2020-03-25 MED ORDER — SODIUM CHLORIDE 0.9 % IV SOLN: INTRAVENOUS | Status: DC

## 2020-03-25 MED ORDER — PANTOPRAZOLE SODIUM 40 MG PO TBEC
40.0000 mg | DELAYED_RELEASE_TABLET | Freq: Two times a day (BID) | ORAL | Status: DC
  Administered 2020-03-25 – 2020-03-27 (×6): 40 mg via ORAL
  Filled 2020-03-25 (×6): qty 1

## 2020-03-25 MED ORDER — SODIUM CHLORIDE 0.9 % IV BOLUS
1000.0000 mL | Freq: Once | INTRAVENOUS | Status: AC
  Administered 2020-03-25: 1000 mL via INTRAVENOUS

## 2020-03-25 MED ORDER — METHOCARBAMOL 500 MG PO TABS
500.0000 mg | ORAL_TABLET | Freq: Three times a day (TID) | ORAL | Status: AC | PRN
  Administered 2020-03-25: 500 mg via ORAL
  Filled 2020-03-25: qty 1

## 2020-03-25 MED ORDER — VALPROIC ACID 250 MG PO CAPS
250.0000 mg | ORAL_CAPSULE | Freq: Three times a day (TID) | ORAL | Status: DC
  Administered 2020-03-25 – 2020-03-27 (×8): 250 mg via ORAL
  Filled 2020-03-25 (×10): qty 1

## 2020-03-25 MED ORDER — PREDNISONE 5 MG PO TABS
5.0000 mg | ORAL_TABLET | Freq: Every day | ORAL | Status: DC
  Administered 2020-03-25 – 2020-03-27 (×3): 5 mg via ORAL
  Filled 2020-03-25 (×3): qty 1

## 2020-03-25 MED ORDER — DEXTROSE 50 % IV SOLN
1.0000 | Freq: Once | INTRAVENOUS | Status: AC
  Administered 2020-03-25: 50 mL via INTRAVENOUS
  Filled 2020-03-25: qty 50

## 2020-03-25 MED ORDER — ACETAMINOPHEN 325 MG PO TABS
650.0000 mg | ORAL_TABLET | Freq: Four times a day (QID) | ORAL | Status: DC | PRN
  Administered 2020-03-27: 650 mg via ORAL
  Filled 2020-03-25: qty 2

## 2020-03-25 MED ORDER — ATORVASTATIN CALCIUM 10 MG PO TABS
10.0000 mg | ORAL_TABLET | Freq: Every day | ORAL | Status: DC
  Administered 2020-03-25 – 2020-03-27 (×3): 10 mg via ORAL
  Filled 2020-03-25 (×3): qty 1

## 2020-03-25 MED ORDER — PANCRELIPASE (LIP-PROT-AMYL) 12000-38000 UNITS PO CPEP
24000.0000 [IU] | ORAL_CAPSULE | Freq: Three times a day (TID) | ORAL | Status: DC
  Administered 2020-03-25 – 2020-03-27 (×9): 24000 [IU] via ORAL
  Filled 2020-03-25 (×9): qty 2

## 2020-03-25 MED ORDER — CALCIUM CARBONATE-VITAMIN D 500-200 MG-UNIT PO TABS
2.0000 | ORAL_TABLET | Freq: Every day | ORAL | Status: DC
  Administered 2020-03-25 – 2020-03-27 (×3): 2 via ORAL
  Filled 2020-03-25 (×3): qty 2

## 2020-03-25 MED ORDER — ALLOPURINOL 300 MG PO TABS
300.0000 mg | ORAL_TABLET | Freq: Every day | ORAL | Status: DC
  Administered 2020-03-25 – 2020-03-27 (×3): 300 mg via ORAL
  Filled 2020-03-25 (×3): qty 1

## 2020-03-25 MED ORDER — FENTANYL CITRATE (PF) 100 MCG/2ML IJ SOLN
50.0000 ug | Freq: Once | INTRAMUSCULAR | Status: AC
  Administered 2020-03-25: 50 ug via INTRAMUSCULAR
  Filled 2020-03-25: qty 2

## 2020-03-25 MED ORDER — ACETAMINOPHEN 650 MG RE SUPP: 650.0000 mg | Freq: Four times a day (QID) | RECTAL | Status: DC | PRN

## 2020-03-25 MED ORDER — INSULIN ASPART 100 UNIT/ML ~~LOC~~ SOLN
5.0000 [IU] | Freq: Once | SUBCUTANEOUS | Status: AC
  Administered 2020-03-25: 5 [IU] via SUBCUTANEOUS
  Filled 2020-03-25: qty 0.05

## 2020-03-25 NOTE — ED Notes (Signed)
[facility] called to say that they could not accept the pt due to low staff, and that we could not take her up at this time

## 2020-03-25 NOTE — Progress Notes (Signed)
I have seen and agree with plan of care as per my partner In addition-this patient has diarrhea that seems to be chronic after taking antibiotics-she took antibiotics recently for UTI seems like it was Augmentin?-She tells me she has passed a stool in the bed-it is [facility] score 4-5 and not watery-this does not seem to be C. difficile-she also reports bloody stool earlier today which I do not see her stool is clay colored and pastelike-we will discontinue C. difficile precautions we will continue plan of care we will also pursue cultures and delineate antibiotics as per plan of care per Dr. Loney Loh from this morning  No charge  Pleas Koch, MD [facility] 5:59 PM

## 2020-03-25 NOTE — ED Notes (Addendum)
Pt had bowel movement and called out requesting assistance getting cleaned up.  RN Lemar Livings And myself cleaned pt, provided new purewick and brief. Pt resting at this time.

## 2020-03-25 NOTE — ED Notes (Signed)
ED TO INPATIENT HANDOFF REPORT  ED Nurse Name and Phone #:   S Name/Age/Gender Amber [name] 64 y.o. female Room/Bed: WA17/WA17  Code Status   Code Status: Full Code  Home/SNF/Other Home Patient oriented to: self, place, time and situation Is this baseline? Yes   Triage Complete: Triage complete  Chief Complaint Diverticulitis [K57.92]  Triage Note Pt arrived via EMS from home. EMS reports anterior chest wall pain for the past 3 days. Pt fell 3 days ago and was diagnosed with broken ribs at [facility]. Pt's pain has progressively gotten worse. Pt has hx of A fib. Pt was given fentanyl intranasally. No IV access was secured, pt usually needs IV team for access. Pt was also diagnosed with a UTI at [facility]. Pt has hx of diabetes    Allergies Allergies  Allergen Reactions  . Ivp Dye [Iodinated Diagnostic Agents] Itching  . Metrizamide Itching    Level of Care/Admitting Diagnosis ED Disposition    ED Disposition Condition Comment   Admit  Hospital Area: [facility] [[id number]]  Level of Care: Telemetry [5]  Admit to tele based on following criteria: Complex arrhythmia (Bradycardia/Tachycardia)  May admit patient to [facility] or [facility] if equivalent level of care is available:: Yes  Covid Evaluation: Asymptomatic Screening Protocol (No Symptoms)  Diagnosis: Diverticulitis [161096]  Admitting Physician: John Giovanni [[id number]]  Attending Physician: John Giovanni [[id number]]  Estimated length of stay: past midnight tomorrow  Certification:: I certify this patient will need inpatient services for at least 2 midnights       B Medical/Surgery History Past Medical History:  Diagnosis Date  . ACS (acute coronary syndrome) (HCC) 06/12/2015  . Acute on chronic blood loss anemia 11/25/2017  . Arthritis   . Asthma   . 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 Chronic pain   . Constipation   .  Diabetes mellitus without complication (HCC)   . Duodenitis   . Embolic stroke (HCC)   . Falls frequently   . Focal seizure (HCC)   . GERD (gastroesophageal reflux disease)   . Hypertension   . Impacted fracture 02/15/2017   right glenoid  . Pneumonia 10/2015  . Seizures (HCC)    last seizure March 2015  . Small bowel obstruction (HCC) 11/25/2017   Past Surgical History:  Procedure Laterality Date  . APPENDECTOMY    . BACK SURGERY    . CHOLECYSTECTOMY    . TEE WITHOUT CARDIOVERSION N/A 10/30/2016   Procedure: TRANSESOPHAGEAL ECHOCARDIOGRAM (TEE);  Surgeon: Lars Masson, MD;  Location: [facility] ENDOSCOPY;  Service: Cardiovascular;  Laterality: N/A;     A IV Location/Drains/Wounds Patient Lines/Drains/Airways Status   Active Line/Drains/Airways    Name:   Placement date:   Placement time:   Site:   Days:   Peripheral IV 03/24/20 Anterior;Left Forearm   03/24/20    2236    Forearm   1   Peripheral IV 03/24/20 Anterior;Proximal;Right Forearm   03/24/20    2237    Forearm   1   External Urinary Catheter   03/24/20    2316    --   1   Pressure Injury 10/10/18 Stage II -  Partial thickness loss of dermis presenting as a shallow open ulcer with a red, pink wound bed without slough.   10/10/18    2230     532   Pressure Injury 10/10/18 Stage II -  Partial thickness loss of dermis  presenting as a shallow open ulcer with a red, pink wound bed without slough.   10/10/18    2230     532   Pressure Injury 10/10/18 Stage II -  Partial thickness loss of dermis presenting as a shallow open ulcer with a red, pink wound bed without slough.   10/10/18    2230     532   Pressure Injury 10/10/18 Stage II -  Partial thickness loss of dermis presenting as a shallow open ulcer with a red, pink wound bed without slough.   10/10/18    2230     532   Pressure Injury 12/29/18 Stage I -  Intact skin with non-blanchable redness of a localized area usually over a bony prominence.   12/29/18    2040     452   Wound /  Incision (Open or Dehisced) 10/10/18 Other (Comment) Sacrum Medial Moisture Associated Wound   10/10/18    2230    Sacrum   532   Wound / Incision (Open or Dehisced) 10/10/18 Other (Comment) Groin Right Moisture Associated Wound   10/10/18    2230    Groin   532   Wound / Incision (Open or Dehisced) 10/10/18 Other (Comment) Groin Left Moisture Associated Wound   10/10/18    2230    Groin   532   Wound / Incision (Open or Dehisced) 10/10/18 Other (Comment) Buttocks Left Moisture Associated Wound   10/10/18    2230    Buttocks   532   Wound / Incision (Open or Dehisced) 10/10/18 Other (Comment) Buttocks Right Moisture Associated Wound   10/10/18    2230    Buttocks   532          Intake/Output Last 24 hours  Intake/Output Summary (Last 24 hours) at 03/25/2020 1125 Last data filed at 03/25/2020 0349 Gross per 24 hour  Intake 1000 ml  Output --  Net 1000 ml    Labs/Imaging Results for orders placed or performed during the hospital encounter of 03/24/20 (from the past 48 hour(s))  CBC with Differential     Status: Abnormal   Collection Time: 03/24/20 10:24 PM  Result Value Ref Range   WBC 13.3 (H) 4.0 - 10.5 K/uL   RBC 3.90 3.87 - 5.11 MIL/uL   Hemoglobin 10.3 (L) 12.0 - 15.0 g/dL   HCT 61.6 (L) 07.3 - 71.0 %   MCV 80.5 80.0 - 100.0 fL   MCH 26.4 26.0 - 34.0 pg   MCHC 32.8 30.0 - 36.0 g/dL   RDW 62.6 (H) 94.8 - 54.6 %   Platelets 108 (L) 150 - 400 K/uL    Comment: Immature Platelet Fraction may be clinically indicated, consider ordering this additional test [id number] PLATELET COUNT CONFIRMED BY SMEAR REPEATED TO VERIFY    nRBC 0.0 0.0 - 0.2 %   Neutrophils Relative % 68 %   Neutro Abs 9.0 (H) 1.7 - 7.7 K/uL   Lymphocytes Relative 23 %   Lymphs Abs 3.0 0.7 - 4.0 K/uL   Monocytes Relative 7 %   Monocytes Absolute 0.9 0.1 - 1.0 K/uL   Eosinophils Relative 1 %   Eosinophils Absolute 0.2 0.0 - 0.5 K/uL   Basophils Relative 0 %   Basophils Absolute 0.0 0.0 - 0.1 K/uL   Immature  Granulocytes 1 %   Abs Immature Granulocytes 0.15 (H) 0.00 - 0.07 K/uL    Comment: Performed at [facility], [address] [place], [place]  [address]  Comprehensive metabolic panel     Status: Abnormal   Collection Time: 03/24/20 10:24 PM  Result Value Ref Range   Sodium 143 135 - 145 mmol/L   Potassium 5.6 (H) 3.5 - 5.1 mmol/L   Chloride 111 98 - 111 mmol/L   CO2 25 22 - 32 mmol/L   Glucose, Bld 103 (H) 70 - 99 mg/dL    Comment: Glucose reference range applies only to samples taken after fasting for at least 8 hours.   BUN 17 8 - 23 mg/dL   Creatinine, Ser 4.78 0.44 - 1.00 mg/dL   Calcium 8.0 (L) 8.9 - 10.3 mg/dL   Total Protein 5.7 (L) 6.5 - 8.1 g/dL   Albumin 2.6 (L) 3.5 - 5.0 g/dL   AST 42 (H) 15 - 41 U/L   ALT 24 0 - 44 U/L   Alkaline Phosphatase 104 38 - 126 U/L   Total Bilirubin 0.5 0.3 - 1.2 mg/dL   GFR calc non Af Amer >60 >60 mL/min   GFR calc Af Amer >60 >60 mL/min   Anion gap 7 5 - 15    Comment: Performed at [facility], [address], [place], [place] [address]  Lactic acid, plasma     Status: None   Collection Time: 03/24/20 10:24 PM  Result Value Ref Range   Lactic Acid, Venous 1.5 0.5 - 1.9 mmol/L    Comment: Performed at [facility], [address], [place], [place] [address]  Lipase, blood     Status: None   Collection Time: 03/24/20 10:24 PM  Result Value Ref Range   Lipase 20 11 - 51 U/L    Comment: Performed at [facility], [address], [place], [place] [address]  Troponin I (High Sensitivity)     Status: None   Collection Time: 03/24/20 10:24 PM  Result Value Ref Range   Troponin I (High Sensitivity) 6 <18 ng/L    Comment: (NOTE) Elevated high sensitivity troponin I (hsTnI) values and significant  changes across serial measurements may suggest ACS but many other  chronic and acute conditions are known to elevate hsTnI results.  Refer to the "Links" section for chest  pain algorithms and additional  guidance. Performed at [facility], [address] [place], [place] [address]   Lactic acid, plasma     Status: None   Collection Time: 03/25/20  1:16 AM  Result Value Ref Range   Lactic Acid, Venous 1.1 0.5 - 1.9 mmol/L    Comment: Performed at [facility], [address], [place], [place] [address]  Troponin I (High Sensitivity)     Status: None   Collection Time: 03/25/20  1:16 AM  Result Value Ref Range   Troponin I (High Sensitivity) 6 <18 ng/L    Comment: (NOTE) Elevated high sensitivity troponin I (hsTnI) values and significant  changes across serial measurements may suggest ACS but many other  chronic and acute conditions are known to elevate hsTnI results.  Refer to the "Links" section for chest pain algorithms and additional  guidance. Performed at [facility], [address] [place], [place] [address]   Urinalysis, Routine w reflex microscopic     Status: Abnormal   Collection Time: 03/25/20  1:23 AM  Result Value Ref Range   Color, Urine YELLOW YELLOW   APPearance CLOUDY (A) CLEAR   Specific Gravity, Urine 1.017 1.005 - 1.030   pH 6.0 5.0 -  8.0   Glucose, UA NEGATIVE NEGATIVE mg/dL   Hgb urine dipstick NEGATIVE NEGATIVE   Bilirubin Urine NEGATIVE NEGATIVE   Ketones, ur NEGATIVE NEGATIVE mg/dL   Protein, ur NEGATIVE NEGATIVE mg/dL   Nitrite NEGATIVE NEGATIVE   Leukocytes,Ua LARGE (A) NEGATIVE   RBC / HPF 6-10 0 - 5 RBC/hpf   WBC, UA >50 (H) 0 - 5 WBC/hpf   Bacteria, UA RARE (A) NONE SEEN   Squamous Epithelial / LPF 0-5 0 - 5   WBC Clumps PRESENT    Mucus PRESENT    Non Squamous Epithelial 0-5 (A) NONE SEEN    Comment: Performed at [facility], [address], [place], [place] [address]  Respiratory Panel by RT PCR (Flu A&B, Covid) - Nasopharyngeal Swab     Status: None   Collection Time: 03/25/20  2:51 AM   Specimen: Nasopharyngeal Swab  Result  Value Ref Range   SARS Coronavirus 2 by RT PCR NEGATIVE NEGATIVE    Comment: (NOTE) SARS-CoV-2 target nucleic acids are NOT DETECTED. The SARS-CoV-2 RNA is generally detectable in upper respiratoy specimens during the acute phase of infection. The lowest concentration of SARS-CoV-2 viral copies this assay can detect is 131 copies/mL. A negative result does not preclude SARS-Cov-2 infection and should not be used as the sole basis for treatment or other patient management decisions. A negative result may occur with  improper specimen collection/handling, submission of specimen other than nasopharyngeal swab, presence of viral mutation(s) within the areas targeted by this assay, and inadequate number of viral copies (<131 copies/mL). A negative result must be combined with clinical observations, patient history, and epidemiological information. The expected result is Negative. Fact Sheet for Patients:  https://www.Sharlet Notaro.com/ Fact Sheet for Healthcare Providers:  https://www.young.biz/ This test is not yet ap proved or cleared by the [place] FDA and  has been authorized for detection and/or diagnosis of SARS-CoV-2 by FDA under an Emergency Use Authorization (EUA). This EUA will remain  in effect (meaning this test can be used) for the duration of the COVID-19 declaration under Section 564(b)(1) of the Act, 21 U.S.C. section 360bbb-3(b)(1), unless the authorization is terminated or revoked sooner.    Influenza A by PCR NEGATIVE NEGATIVE   Influenza B by PCR NEGATIVE NEGATIVE    Comment: (NOTE) The Xpert Xpress SARS-CoV-2/FLU/RSV assay is intended as an aid in  the diagnosis of influenza from Nasopharyngeal swab specimens and  should not be used as a sole basis for treatment. Nasal washings and  aspirates are unacceptable for Xpert Xpress SARS-CoV-2/FLU/RSV  testing. Fact Sheet for Patients: https://www.Kemora Pinard.com/ Fact  Sheet for Healthcare Providers: https://www.young.biz/ This test is not yet approved or cleared by the [place] FDA and  has been authorized for detection and/or diagnosis of SARS-CoV-2 by  FDA under an Emergency Use Authorization (EUA). This EUA will remain  in effect (meaning this test can be used) for the duration of the  Covid-19 declaration under Section 564(b)(1) of the Act, 21  U.S.C. section 360bbb-3(b)(1), unless the authorization is  terminated or revoked. Performed at [facility], [address] [place], [place] [address]   CBG monitoring, ED     Status: None   Collection Time: 03/25/20  5:17 AM  Result Value Ref Range   Glucose-Capillary 75 70 - 99 mg/dL    Comment: Glucose reference range applies only to samples taken after fasting for at least 8 hours.  Basic metabolic panel     Status: Abnormal  Collection Time: 03/25/20  6:33 AM  Result Value Ref Range   Sodium 144 135 - 145 mmol/L   Potassium 4.1 3.5 - 5.1 mmol/L    Comment: DELTA CHECK NOTED   Chloride 113 (H) 98 - 111 mmol/L   CO2 24 22 - 32 mmol/L   Glucose, Bld 134 (H) 70 - 99 mg/dL    Comment: Glucose reference range applies only to samples taken after fasting for at least 8 hours.   BUN 16 8 - 23 mg/dL   Creatinine, Ser 0.86 0.44 - 1.00 mg/dL   Calcium 7.9 (L) 8.9 - 10.3 mg/dL   GFR calc non Af Amer >60 >60 mL/min   GFR calc Af Amer >60 >60 mL/min   Anion gap 7 5 - 15    Comment: Performed at [facility], [place] [address] [place], [place] [address]  CBC     Status: Abnormal   Collection Time: 03/25/20  6:33 AM  Result Value Ref Range   WBC 11.3 (H) 4.0 - 10.5 K/uL   RBC 3.36 (L) 3.87 - 5.11 MIL/uL   Hemoglobin 8.9 (L) 12.0 - 15.0 g/dL   HCT 27.4 (L) 36.0 - 46.0 %   MCV 81.5 80.0 - 100.0 fL   MCH 26.5 26.0 - 34.0 pg   MCHC 32.5 30.0 - 36.0 g/dL   RDW 16.6 (H) 11.5 - 15.5 %   Platelets 80 (L) 150 - 400 K/uL    Comment: CONSISTENT WITH  PREVIOUS RESULT Immature Platelet Fraction may be clinically indicated, consider ordering this additional test [id number]    nRBC 0.0 0.0 - 0.2 %    Comment: Performed at [facility], [place] [address] [place], [place] [address]   CT ABDOMEN PELVIS WO CONTRAST  Result Date: 03/24/2020 CLINICAL DATA:  Chest discomfort, nausea and vomiting EXAM: CT CHEST, ABDOMEN AND PELVIS WITHOUT CONTRAST TECHNIQUE: Multidetector CT imaging of the chest, abdomen and pelvis was performed following the standard protocol without IV contrast. COMPARISON:  03/21/2020 FINDINGS: CT CHEST FINDINGS Cardiovascular: Unenhanced imaging of the heart and great vessels demonstrates no pericardial effusion. Normal caliber of the thoracic aorta. Mild diffuse atherosclerosis. Mediastinum/Nodes: No enlarged mediastinal, hilar, or axillary lymph nodes. Thyroid gland, trachea, and esophagus demonstrate no significant findings. Lungs/Pleura: No acute airspace disease, effusion, or pneumothorax. Central airways are patent. Musculoskeletal: Multiple prior healed right-sided rib fractures. No acute bony abnormalities. Extensive postsurgical changes from thoracic fusion. Prior corpectomy at T4 and T5. CT ABDOMEN PELVIS FINDINGS Hepatobiliary: No focal liver abnormality is seen. Status post cholecystectomy. No biliary dilatation. Pancreas: Diffuse pancreatic atrophy. Calcifications consistent with sequela of chronic calcific pancreatitis. No acute inflammatory changes. Spleen: Normal in size without focal abnormality. Adrenals/Urinary Tract: No urinary tract calculi or obstructive uropathy. The bladder is unremarkable. The adrenals are normal. Stomach/Bowel: Scattered colonic diverticulosis without diverticulitis. No bowel obstruction or ileus. Moderate stool throughout the colon. Vascular/Lymphatic: Aortic atherosclerosis. No enlarged abdominal or pelvic lymph nodes. Reproductive: Uterus and bilateral adnexa are unremarkable. Other:  Stable fat containing left inguinal hernia. No abdominal wall hernia. No free fluid or free gas. Musculoskeletal: No acute or destructive bony lesions. Chronic T12, L1, and L3 compression deformities. Reconstructed images demonstrate no additional findings. IMPRESSION: 1. No acute intrathoracic, intra-abdominal, or intrapelvic process. 2. Sequelae of chronic calcific pancreatitis. 3. Stable fat containing left inguinal hernia. 4. Moderate fecal retention. 5. Aortic Atherosclerosis (ICD10-I70.0). Electronically Signed   By: Randa Ngo M.D.   On: 03/24/2020 23:07   CT  Chest Wo Contrast  Result Date: 03/24/2020 CLINICAL DATA:  Chest discomfort, nausea and vomiting EXAM: CT CHEST, ABDOMEN AND PELVIS WITHOUT CONTRAST TECHNIQUE: Multidetector CT imaging of the chest, abdomen and pelvis was performed following the standard protocol without IV contrast. COMPARISON:  03/21/2020 FINDINGS: CT CHEST FINDINGS Cardiovascular: Unenhanced imaging of the heart and great vessels demonstrates no pericardial effusion. Normal caliber of the thoracic aorta. Mild diffuse atherosclerosis. Mediastinum/Nodes: No enlarged mediastinal, hilar, or axillary lymph nodes. Thyroid gland, trachea, and esophagus demonstrate no significant findings. Lungs/Pleura: No acute airspace disease, effusion, or pneumothorax. Central airways are patent. Musculoskeletal: Multiple prior healed right-sided rib fractures. No acute bony abnormalities. Extensive postsurgical changes from thoracic fusion. Prior corpectomy at T4 and T5. CT ABDOMEN PELVIS FINDINGS Hepatobiliary: No focal liver abnormality is seen. Status post cholecystectomy. No biliary dilatation. Pancreas: Diffuse pancreatic atrophy. Calcifications consistent with sequela of chronic calcific pancreatitis. No acute inflammatory changes. Spleen: Normal in size without focal abnormality. Adrenals/Urinary Tract: No urinary tract calculi or obstructive uropathy. The bladder is unremarkable. The  adrenals are normal. Stomach/Bowel: Scattered colonic diverticulosis without diverticulitis. No bowel obstruction or ileus. Moderate stool throughout the colon. Vascular/Lymphatic: Aortic atherosclerosis. No enlarged abdominal or pelvic lymph nodes. Reproductive: Uterus and bilateral adnexa are unremarkable. Other: Stable fat containing left inguinal hernia. No abdominal wall hernia. No free fluid or free gas. Musculoskeletal: No acute or destructive bony lesions. Chronic T12, L1, and L3 compression deformities. Reconstructed images demonstrate no additional findings. IMPRESSION: 1. No acute intrathoracic, intra-abdominal, or intrapelvic process. 2. Sequelae of chronic calcific pancreatitis. 3. Stable fat containing left inguinal hernia. 4. Moderate fecal retention. 5. Aortic Atherosclerosis (ICD10-I70.0). Electronically Signed   By: Sharlet Salina M.D.   On: 03/24/2020 23:07    Pending Labs Unresulted Labs (From admission, onward)    Start     Ordered   03/25/20 0453  C Difficile Quick Screen w PCR reflex  (C Difficile quick screen w PCR reflex panel)  Once, for 24 hours,   STAT     03/25/20 0452   03/25/20 0453  Gastrointestinal Panel by PCR , Stool  (Gastrointestinal Panel by PCR, Stool                                                                                                                                                     *Does Not include CLOSTRIDIUM DIFFICILE testing.**If CDIFF testing is needed, select the C Difficile Quick Screen w PCR reflex order below)  Once,   STAT     03/25/20 0452   03/24/20 2152  Blood culture (routine x 2)  BLOOD CULTURE X 2,   STAT     03/24/20 2151   03/24/20 2150  Urine culture  ONCE - STAT,   STAT     03/24/20 2151  Vitals/Pain Today's Vitals   03/25/20 0800 03/25/20 1016 03/25/20 1030 03/25/20 1100  BP: 118/84 115/78 (!) 143/85 (!) 101/91  Pulse: (!) 103 (!) 102 99 69  Resp: 19 15 16 20   Temp:      TempSrc:      SpO2: 100% 99% 100%  98%  Weight:      Height:      PainSc:        Isolation Precautions Enteric precautions (UV disinfection)  Medications Medications  cefTRIAXone (ROCEPHIN) 1 g in sodium chloride 0.9 % 100 mL IVPB (has no administration in time range)  ondansetron (ZOFRAN) injection 4 mg (has no administration in time range)  allopurinol (ZYLOPRIM) tablet 300 mg (300 mg Oral Given 03/25/20 1030)  oxyCODONE (Oxy IR/ROXICODONE) immediate release tablet 10 mg (10 mg Oral Given 03/25/20 0518)  atorvastatin (LIPITOR) tablet 10 mg (has no administration in time range)  metoprolol succinate (TOPROL-XL) 24 hr tablet 25 mg (25 mg Oral Given 03/25/20 1030)  predniSONE (DELTASONE) tablet 5 mg (5 mg Oral Given 03/25/20 0754)  lipase/protease/amylase (CREON) capsule 24,000 Units (24,000 Units Oral Given 03/25/20 0754)  pantoprazole (PROTONIX) EC tablet 40 mg (40 mg Oral Given 03/25/20 0754)  apixaban (ELIQUIS) tablet 5 mg (5 mg Oral Given 03/25/20 1030)  folic acid (FOLVITE) tablet 1 mg (1 mg Oral Given 03/25/20 1030)  valproic acid (DEPAKENE) 250 MG capsule 250 mg (250 mg Oral Given 03/25/20 1030)  calcium-vitamin D (OSCAL WITH D) 500-200 MG-UNIT per tablet 2 tablet (2 tablets Oral Given 03/25/20 0754)  0.9 %  sodium chloride infusion ( Intravenous New Bag/Given 03/25/20 0532)  acetaminophen (TYLENOL) tablet 650 mg (has no administration in time range)    Or  acetaminophen (TYLENOL) suppository 650 mg (has no administration in time range)  albuterol (PROVENTIL) (2.5 MG/3ML) 0.083% nebulizer solution 2.5 mg (has no administration in time range)  fentaNYL (SUBLIMAZE) injection 50 mcg (50 mcg Intramuscular Given 03/24/20 2202)  ondansetron (ZOFRAN) injection 4 mg (4 mg Intravenous Given 03/24/20 2238)  fentaNYL (SUBLIMAZE) injection 50 mcg (50 mcg Intramuscular Given 03/25/20 0101)  sodium chloride 0.9 % bolus 1,000 mL (0 mLs Intravenous Stopped 03/25/20 0349)  cefTRIAXone (ROCEPHIN) 1 g in sodium chloride 0.9 % 100 mL IVPB (0 g Intravenous  Stopped 03/25/20 0512)  insulin aspart (novoLOG) injection 5 Units (5 Units Subcutaneous Given 03/25/20 0526)  dextrose 50 % solution 50 mL (50 mLs Intravenous Given 03/25/20 0526)    Mobility non-ambulatory High fall risk   Focused Assessments Cardiac Assessment Handoff:  Cardiac Rhythm: Atrial fibrillation Lab Results  Component Value Date   CKTOTAL 66 01/26/2015   TROPONINI 0.34 (HH) 01/01/2019   Lab Results  Component Value Date   DDIMER 2.59 (H) 11/23/2016   Does the Patient currently have chest pain? No     R Recommendations: See Admitting Provider Note  Report given to:   Additional Notes:

## 2020-03-25 NOTE — ED Notes (Signed)
Notified by 5 E and AC that patient can not be transported upstairs at this time due to staffing shortage. Charge nurse aware.

## 2020-03-25 NOTE — H&P (Addendum)
History and Physical    Amber [name] WKM:[id number] DOB: 1956/03/31 DOA: 03/24/2020  PCP: [name], MD Patient coming from: Home  Chief Complaint: Abdominal pain, vomiting, chest pain  HPI: Amber [name] is a 64 y.o. female with medical history significant of CAD, asthma, diabetes, stroke, frequent falls, GERD, hypertension, seizures, rheumatoid arthritis presenting with complaints of abdominal pain, nausea, vomiting, and chest pain.  Patient states for the past 1 week she has been feeling nauseous but has not vomited.  She has been taking sips of ginger ale.  She is also having pain all over her abdomen and her lower chest.  She started having diarrhea a few days ago.  She has been feeling short of breath.  Denies cough or fevers.  States she was seen at another hospital few days ago and prescribed an antibiotic for UTI which she has been taking.  States she has already received her Covid vaccines.  Reports having chronic pain in her back due to history of major back surgery.  She is requesting her home oxycodone.  Also requesting food to eat.  No additional history could be obtained from the patient.  ED Course: Febrile and slightly tachycardic.  Labs showing mild leukocytosis.  Lactic acid normal x2.  Hemoglobin 10.3, above baseline.  Platelet count 108, mildly low on previous labs as well.  Potassium 5.6.  Lipase normal.  No significant elevation of LFTs.  High-sensitivity troponin negative x2.  EKG without acute ischemic changes.  UA with large amount of leukocytes, greater than 50 WBCs, and rare bacteria.  Urine culture pending.  Blood culture x2 pending.  SARS-CoV-2 PCR test pending. CT chest negative for acute intrathoracic process. CT abdomen pelvis showing moderate fecal retention and no other acute abnormality. Patient was given fentanyl, Zofran, ceftriaxone, and 1 L normal saline bolus.  Review of Systems:  All systems reviewed and apart from history of presenting illness, are  negative.  Past Medical History:  Diagnosis Date  . ACS (acute coronary syndrome) (HCC) 06/12/2015  . Acute on chronic blood loss anemia 11/25/2017  . Arthritis   . Asthma   . 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 Chronic pain   . Constipation   . Diabetes mellitus without complication (HCC)   . Duodenitis   . Embolic stroke (HCC)   . Falls frequently   . Focal seizure (HCC)   . GERD (gastroesophageal reflux disease)   . Hypertension   . Impacted fracture 02/15/2017   right glenoid  . Pneumonia 10/2015  . Seizures (HCC)    last seizure March 2015  . Small bowel obstruction (HCC) 11/25/2017    Past Surgical History:  Procedure Laterality Date  . APPENDECTOMY    . BACK SURGERY    . CHOLECYSTECTOMY    . TEE WITHOUT CARDIOVERSION N/A 10/30/2016   Procedure: TRANSESOPHAGEAL ECHOCARDIOGRAM (TEE);  Surgeon: Lars Masson, MD;  Location: [facility] ENDOSCOPY;  Service: Cardiovascular;  Laterality: N/A;     reports that she quit smoking about 11 years ago. She has never used smokeless tobacco. She reports that she does not drink alcohol or use drugs.  Allergies  Allergen Reactions  . Ivp Dye [Iodinated Diagnostic Agents] Itching  . Metrizamide Itching    Family History  Problem Relation Age of Onset  . Kidney failure Mother   . Hypertension Sister   . Hypertension Brother     Prior to Admission medications   Medication Sig Start Date End Date Taking? Authorizing Provider  acetaminophen (TYLENOL) 500 MG tablet Take 2 tablets (1,000 mg total) by mouth every 8 (eight) hours. 12/30/19  Yes Almon Hercules, MD  albuterol (PROVENTIL HFA;VENTOLIN HFA) 108 (90 Base) MCG/ACT inhaler Inhale 2 puffs into the lungs every 4 (four) hours as needed for wheezing or shortness of breath. 01/16/19  Yes Glade Lloyd, MD  allopurinol (ZYLOPRIM) 300 MG tablet Take 300 mg by mouth daily. 11/11/16  Yes [provider]  amoxicillin-clavulanate (AUGMENTIN) 875-125 MG  tablet Take 1 tablet by mouth 2 (two) times daily.  03/21/20 03/28/20 Yes [provider]  apixaban (ELIQUIS) 5 MG TABS tablet Take 1 tablet (5 mg total) by mouth 2 (two) times daily. 02/14/20  Yes Marlin Canary U, DO  atorvastatin (LIPITOR) 10 MG tablet Take 10 mg by mouth daily at 6 PM.  08/13/15  Yes [provider]  calcium-vitamin D (OSCAL WITH D) 500-200 MG-UNIT tablet Take 2 tablets by mouth daily with breakfast. 12/30/19  Yes Gonfa, Taye T, MD  CREON 24000-76000 units CPEP Take 1 capsule by mouth 3 (three) times daily.  09/23/19  Yes [provider]  esomeprazole (NEXIUM) 40 MG capsule Take 40 mg by mouth 2 (two) times daily before a meal.  11/26/19  Yes [provider]  folic acid (FOLVITE) 1 MG tablet Take 1 mg by mouth daily. 11/20/19  Yes [provider]  furosemide (LASIX) 40 MG tablet Take 1 tablet (40 mg total) by mouth daily as needed for fluid or edema (sob). 01/02/20 03/24/20 Yes Almon Hercules, MD  metoprolol succinate (TOPROL-XL) 25 MG 24 hr tablet Take 1 tablet (25 mg total) by mouth daily. 10/15/18  Yes Mikhail, [place], DO  nystatin-triamcinolone ointment (MYCOLOG) Apply 1 application topically 2 (two) times daily as needed for rash. 09/23/19  Yes [provider]  Oxycodone HCl 20 MG TABS Take 10 mg by mouth every 6 (six) hours as needed (severe pain).  12/08/19  Yes [provider]  predniSONE (DELTASONE) 5 MG tablet Take 1 tablet (5 mg total) by mouth daily with breakfast. 01/16/19  Yes Glade Lloyd, MD  valproic acid (DEPAKENE) 250 MG capsule Take 250 mg by mouth 3 (three) times daily.    Yes [provider]    Physical Exam: Vitals:   03/25/20 0330 03/25/20 0345 03/25/20 0346 03/25/20 0346  BP: 134/89 (!) 125/93 (!) 125/93 (!) 125/93  Pulse:   95 95  Resp: 17 18 17 17   Temp:      TempSrc:      SpO2:   100% 100%  Weight:      Height:        Physical Exam  Constitutional: She is oriented to person, place,  and time.  HENT:  Head: Normocephalic.  Eyes: Right eye exhibits no discharge. Left eye exhibits no discharge.  Cardiovascular: Normal rate, regular rhythm and intact distal pulses.  Pulmonary/Chest: Effort normal and breath sounds normal. No respiratory distress. She has no wheezes. She has no rales.  Abdominal: Soft. Bowel sounds are normal. She exhibits no distension. There is abdominal tenderness. There is guarding.  Generalized tenderness to palpation with guarding  Musculoskeletal:        General: No edema.     Cervical back: Neck supple.  Neurological: She is alert and oriented to person, place, and time.  Skin: Skin is warm and dry. She is not diaphoretic.    Labs on Admission: I have personally reviewed following labs and imaging studies  CBC: Recent Labs  Lab 03/24/20 2224  WBC 13.3*  NEUTROABS 9.0*  HGB 10.3*  HCT 31.4*  MCV 80.5  PLT 108*   Basic Metabolic Panel: Recent Labs  Lab 03/24/20 2224  NA 143  K 5.6*  CL 111  CO2 25  GLUCOSE 103*  BUN 17  CREATININE 0.98  CALCIUM 8.0*   GFR: Estimated Creatinine Clearance: 43.3 mL/min (by C-G formula based on SCr of 0.98 mg/dL). Liver Function Tests: Recent Labs  Lab 03/24/20 2224  AST 42*  ALT 24  ALKPHOS 104  BILITOT 0.5  PROT 5.7*  ALBUMIN 2.6*   Recent Labs  Lab 03/24/20 2224  LIPASE 20   No results for input(s): AMMONIA in the last 168 hours. Coagulation Profile: No results for input(s): INR, PROTIME in the last 168 hours. Cardiac Enzymes: No results for input(s): CKTOTAL, CKMB, CKMBINDEX, TROPONINI in the last 168 hours. BNP (last 3 results) No results for input(s): PROBNP in the last 8760 hours. HbA1C: No results for input(s): HGBA1C in the last 72 hours. CBG: No results for input(s): GLUCAP in the last 168 hours. Lipid Profile: No results for input(s): CHOL, HDL, LDLCALC, TRIG, CHOLHDL, LDLDIRECT in the last 72 hours. Thyroid Function Tests: No results for input(s): TSH, T4TOTAL,  FREET4, T3FREE, THYROIDAB in the last 72 hours. Anemia Panel: No results for input(s): VITAMINB12, FOLATE, FERRITIN, TIBC, IRON, RETICCTPCT in the last 72 hours. Urine analysis:    Component Value Date/Time   COLORURINE YELLOW 03/25/2020 0123   APPEARANCEUR CLOUDY (A) 03/25/2020 0123   LABSPEC 1.017 03/25/2020 0123   PHURINE 6.0 03/25/2020 0123   GLUCOSEU NEGATIVE 03/25/2020 0123   HGBUR NEGATIVE 03/25/2020 0123   BILIRUBINUR NEGATIVE 03/25/2020 0123   KETONESUR NEGATIVE 03/25/2020 0123   PROTEINUR NEGATIVE 03/25/2020 0123   UROBILINOGEN 1.0 07/27/2015 1052   NITRITE NEGATIVE 03/25/2020 0123   LEUKOCYTESUR LARGE (A) 03/25/2020 0123    Radiological Exams on Admission: CT ABDOMEN PELVIS WO CONTRAST  Result Date: 03/24/2020 CLINICAL DATA:  Chest discomfort, nausea and vomiting EXAM: CT CHEST, ABDOMEN AND PELVIS WITHOUT CONTRAST TECHNIQUE: Multidetector CT imaging of the chest, abdomen and pelvis was performed following the standard protocol without IV contrast. COMPARISON:  03/21/2020 FINDINGS: CT CHEST FINDINGS Cardiovascular: Unenhanced imaging of the heart and great vessels demonstrates no pericardial effusion. Normal caliber of the thoracic aorta. Mild diffuse atherosclerosis. Mediastinum/Nodes: No enlarged mediastinal, hilar, or axillary lymph nodes. Thyroid gland, trachea, and esophagus demonstrate no significant findings. Lungs/Pleura: No acute airspace disease, effusion, or pneumothorax. Central airways are patent. Musculoskeletal: Multiple prior healed right-sided rib fractures. No acute bony abnormalities. Extensive postsurgical changes from thoracic fusion. Prior corpectomy at T4 and T5. CT ABDOMEN PELVIS FINDINGS Hepatobiliary: No focal liver abnormality is seen. Status post cholecystectomy. No biliary dilatation. Pancreas: Diffuse pancreatic atrophy. Calcifications consistent with sequela of chronic calcific pancreatitis. No acute inflammatory changes. Spleen: Normal in size without  focal abnormality. Adrenals/Urinary Tract: No urinary tract calculi or obstructive uropathy. The bladder is unremarkable. The adrenals are normal. Stomach/Bowel: Scattered colonic diverticulosis without diverticulitis. No bowel obstruction or ileus. Moderate stool throughout the colon. Vascular/Lymphatic: Aortic atherosclerosis. No enlarged abdominal or pelvic lymph nodes. Reproductive: Uterus and bilateral adnexa are unremarkable. Other: Stable fat containing left inguinal hernia. No abdominal wall hernia. No free fluid or free gas. Musculoskeletal: No acute or destructive bony lesions. Chronic T12, L1, and L3 compression deformities. Reconstructed images demonstrate no additional findings. IMPRESSION: 1. No acute intrathoracic, intra-abdominal, or intrapelvic process. 2. Sequelae of chronic calcific pancreatitis. 3. Stable fat  containing left inguinal hernia. 4. Moderate fecal retention. 5. Aortic Atherosclerosis (ICD10-I70.0). Electronically Signed   By: Sharlet Salina M.D.   On: 03/24/2020 23:07   CT Chest Wo Contrast  Result Date: 03/24/2020 CLINICAL DATA:  Chest discomfort, nausea and vomiting EXAM: CT CHEST, ABDOMEN AND PELVIS WITHOUT CONTRAST TECHNIQUE: Multidetector CT imaging of the chest, abdomen and pelvis was performed following the standard protocol without IV contrast. COMPARISON:  03/21/2020 FINDINGS: CT CHEST FINDINGS Cardiovascular: Unenhanced imaging of the heart and great vessels demonstrates no pericardial effusion. Normal caliber of the thoracic aorta. Mild diffuse atherosclerosis. Mediastinum/Nodes: No enlarged mediastinal, hilar, or axillary lymph nodes. Thyroid gland, trachea, and esophagus demonstrate no significant findings. Lungs/Pleura: No acute airspace disease, effusion, or pneumothorax. Central airways are patent. Musculoskeletal: Multiple prior healed right-sided rib fractures. No acute bony abnormalities. Extensive postsurgical changes from thoracic fusion. Prior corpectomy at T4  and T5. CT ABDOMEN PELVIS FINDINGS Hepatobiliary: No focal liver abnormality is seen. Status post cholecystectomy. No biliary dilatation. Pancreas: Diffuse pancreatic atrophy. Calcifications consistent with sequela of chronic calcific pancreatitis. No acute inflammatory changes. Spleen: Normal in size without focal abnormality. Adrenals/Urinary Tract: No urinary tract calculi or obstructive uropathy. The bladder is unremarkable. The adrenals are normal. Stomach/Bowel: Scattered colonic diverticulosis without diverticulitis. No bowel obstruction or ileus. Moderate stool throughout the colon. Vascular/Lymphatic: Aortic atherosclerosis. No enlarged abdominal or pelvic lymph nodes. Reproductive: Uterus and bilateral adnexa are unremarkable. Other: Stable fat containing left inguinal hernia. No abdominal wall hernia. No free fluid or free gas. Musculoskeletal: No acute or destructive bony lesions. Chronic T12, L1, and L3 compression deformities. Reconstructed images demonstrate no additional findings. IMPRESSION: 1. No acute intrathoracic, intra-abdominal, or intrapelvic process. 2. Sequelae of chronic calcific pancreatitis. 3. Stable fat containing left inguinal hernia. 4. Moderate fecal retention. 5. Aortic Atherosclerosis (ICD10-I70.0). Electronically Signed   By: Sharlet Salina M.D.   On: 03/24/2020 23:07    EKG: Independently reviewed.  Sinus tachycardia, heart rate 117.  T wave abnormality in inferior lateral leads similar to prior tracing.  No acute ischemic changes.  Assessment/Plan Principal Problem:   UTI (urinary tract infection) Active Problems:   Chest pain   SIRS (systemic inflammatory response syndrome) (HCC)   Abdominal pain   Diarrhea   SIRS secondary to UTI: Febrile and slightly tachycardic.  Labs showing mild leukocytosis.  Lactic acid normal x2.  UA with large amount of leukocytes, greater than 50 WBCs, and rare bacteria.  -Continue IV fluid hydration and ceftriaxone.  Urine culture  pending.  Blood culture x2 pending.  Continue to monitor WBC count.  Abdominal pain, nausea, diarrhea: Lipase normal.  No significant elevation of LFTs.  CT abdomen pelvis showing moderate fecal retention and no other acute abnormality. -Antiemetic as needed.  Order C. difficile PCR and GI pathogen panel.  Lower chest pain: Does have history of CAD but work-up at present not suggestive of ACS. High-sensitivity troponin negative x2.  EKG without acute ischemic changes.   -Continue cardiac monitoring  Dyspnea: Patient is complaining of dyspnea.  Lungs clear on exam and no hypoxia.  No signs of respiratory distress.  CT chest negative for acute intrathoracic process. -Continue to monitor  Mild hyperkalemia: Potassium 5.6.  EKG without acute changes.  Unclear exact etiology.  No potassium supplementation or potassium sparing diuretic listed in home medications. -Give insulin and D50.  Repeat BMP.  History of prediabetes: Diabetes is documented in her chart.  Last A1c 4.4 on 12/25/2019.  A1c was 5.7 in June 2019.  Currently not on any medications.  Not hyperglycemic.  Chronic back pain: Continue home oxycodone.  Rheumatoid arthritis: On chronic prednisone, continue.  History of CVA: Continue Eliquis and Lipitor  DVT prophylaxis: Eliquis Code Status: Full code Family Communication: No family available at this time. Disposition Plan: Status is: Inpatient  Remains inpatient appropriate because:IV treatments appropriate due to intensity of illness or inability to take PO   Dispo: The patient is from: Home              Anticipated d/c is to: Home              Anticipated d/c date is: 2 days              Patient currently is not medically stable to d/c.  The medical decision making on this patient was of high complexity and the patient is at high risk for clinical deterioration, therefore this is a level 3 visit.  Shela Leff MD [facility]  If 7PM-7AM, please contact  night-coverage [email or web address]  03/25/2020, 4:55 AM

## 2020-03-26 LAB — CLOSTRIDIUM DIFFICILE BY PCR, REFLEXED: Toxigenic C. Difficile by PCR: NEGATIVE

## 2020-03-26 MED ORDER — METHOCARBAMOL 500 MG PO TABS
500.0000 mg | ORAL_TABLET | Freq: Once | ORAL | Status: AC
  Administered 2020-03-26: 500 mg via ORAL
  Filled 2020-03-26: qty 1

## 2020-03-26 MED ORDER — SENNOSIDES-DOCUSATE SODIUM 8.6-50 MG PO TABS
1.0000 | ORAL_TABLET | Freq: Every day | ORAL | Status: DC
  Administered 2020-03-26: 1 via ORAL
  Filled 2020-03-26: qty 1

## 2020-03-26 MED ORDER — FUROSEMIDE 40 MG PO TABS: 40.0000 mg | ORAL_TABLET | Freq: Every day | ORAL | Status: DC | PRN

## 2020-03-26 MED ORDER — AMLODIPINE BESYLATE 10 MG PO TABS
10.0000 mg | ORAL_TABLET | Freq: Every day | ORAL | Status: DC
  Administered 2020-03-26 – 2020-03-27 (×2): 10 mg via ORAL
  Filled 2020-03-26 (×2): qty 1

## 2020-03-26 MED ORDER — POLYETHYLENE GLYCOL 3350 17 G PO PACK
17.0000 g | PACK | Freq: Every day | ORAL | Status: DC
  Administered 2020-03-26 – 2020-03-27 (×2): 17 g via ORAL
  Filled 2020-03-26 (×2): qty 1

## 2020-03-26 NOTE — Evaluation (Signed)
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 fib CHADS2 score 5 supposed to be on Eliquis but not because of recurrent GI bleeds (multiple duodenal ulcers gastric antral vascular ectasia)Also history of upper extremity DVTs on Eliquis with a PFOPatient was recently treated for urinary tract infection for 3 to 5 daysAnd was recommended to continue the antibiotics but she started complaining of vomiting abdominal pain chest pain-she also has chronic back pain and is on medication for the same Emergency room work-up showed lactic acidosis normal potassium 5.6 mild white count UA was positive however culture later came on as being negativeCT chest as well as abdomen pelvis showed no abscess or abnormality other than moderate fecal retention   Clinical Impression   Patient presents with decreased ROM and strength of upper extremities and with significant pain in joints secondary to RA limiting ability to participate and assist with  ADLs and functional mobility. Today patient provided with and education on positioning in order to improve patient's ability to feed self. Patient will benefit from skilled OT services to improve deficits and improve functional abilities in order to reduce caregiver load. Patient wants OT/PT HH services in order to "get better."    Follow Up Recommendations  Home health OT Patient wants PT as well.   Equipment Recommendations        Recommendations for Other Services PT consult(Patient wants PT to see if she can improve mobility.)     Precautions / Restrictions Precautions Precautions: Fall Restrictions Weight Bearing Restrictions: No      Mobility Bed Mobility                  Transfers                 General transfer comment: No assessed. Patient reports limited mobility.    Balance                                           ADL either performed or assessed with clinical judgement   ADL Overall ADL's : Needs assistance/impaired Eating/Feeding: Moderate assistance Eating/Feeding Details (indicate cue type and reason): Patient provided with two separate built up handles for utensils and provided with two handled cup for drinking. Patient educated on use of equipment and demonstrated ability to use. patient has decreased ROM resulting in difficulty with reaching mouth - therapist propped elbow up on pillow to assist with reaching mouth. Grooming: Moderate assistance   Upper Body Bathing: Total assistance   Lower Body Bathing: Total assistance   Upper Body Dressing : Total assistance   Lower Body Dressing: Total assistance   Toilet Transfer: Maximal assistance   Toileting- Clothing Manipulation and Hygiene: Total assistance   Tub/ Shower Transfer: Maximal assistance     General ADL Comments: Patient reports almost total assistance at home. Reports increasing difficulty with feeding.     Vision   Vision Assessment?: No apparent visual deficits  Perception     Praxis      Pertinent Vitals/Pain Pain Assessment: Faces Faces Pain Scale: Hurts whole lot Pain Location: Bilteral shoulders with AROM Pain Descriptors / Indicators: Aching Pain Intervention(s): Monitored during session     Hand Dominance Right   Extremity/Trunk Assessment Upper Extremity Assessment Upper Extremity Assessment: RUE deficits/detail;LUE deficits/detail RUE Deficits / Details:  Decreased shoulder ROM, arthritic changes in hand, ulnar drift of fingers. Pain with movement in shoulders and elbow and wrist and fingers. LUE Deficits / Details: Decreased shoulder ROM, arthritic changes in hand. Pain with movement in shoulder and elbow and wrist and fingers.           Communication Communication Communication: No difficulties   Cognition Arousal/Alertness: Awake/alert Behavior During Therapy: WFL for tasks assessed/performed Overall Cognitive Status: Within Functional Limits for tasks assessed                                     General Comments       Exercises     Shoulder Instructions      Home Living Family/patient expects to be discharged to:: Private residence Living Arrangements: Children;Other relatives Available Help at Discharge: Family;Personal care attendant;Available 24 hours/day Type of Home: Apartment Home Access: Stairs to enter Entrance Stairs-Number of Steps: 5 Entrance Stairs-Rails: Right Home Layout: One level     Bathroom Shower/Tub: Teacher, early years/pre: Standard Bathroom Accessibility: Yes   Home Equipment: Clinical cytogeneticist - 4 wheels;Wheelchair - manual;Hospital bed;Bedside commode          Prior Functioning/Environment Level of Independence: Needs assistance  Gait / Transfers Assistance Needed: Patient reports minimal ambulation. Reports she mostly just transfers. ADL's / Homemaking Assistance Needed: Patient reports increasing difficulty with feeding due to poor shoulder ROM and arthritic hands secondary to RA. has assistance for bathing, dressing and toileting and feeding at times.            OT Problem List: Decreased strength;Decreased range of motion;Decreased activity tolerance;Pain;Impaired UE functional use      OT Treatment/Interventions:      OT Goals(Current goals can be found in the care plan section) Acute Rehab OT Goals Patient Stated Goal: To get better and do more OT  Goal Formulation: With patient Time For Goal Achievement: 04/09/20 Potential to Achieve Goals: Fair  OT Frequency:     Barriers to D/C:            Co-evaluation              AM-PAC OT "6 Clicks" Daily Activity     Outcome Measure Help from another person eating meals?: A Lot Help from another person taking care of personal grooming?: A Lot Help from another person toileting, which includes using toliet, bedpan, or urinal?: Total Help from another person bathing (including washing, rinsing, drying)?: Total Help from another person to put on and taking off regular upper body clothing?: Total Help from another person to put on and taking off regular lower body clothing?: Total 6 Click Score: 8   End of Session Nurse Communication: (left with AE for feeding.)  Activity Tolerance: Patient tolerated treatment well Patient left: in bed;with bed alarm set;with call bell/phone within reach  OT Visit Diagnosis: Muscle weakness (generalized) (M62.81);Pain;Feeding difficulties (R63.3)                Time: 6270-3500 OT Time Calculation (min): 15 min Charges:  OT General Charges $OT Visit: 1 Visit OT Evaluation $OT Eval Moderate Complexity: 1 Mod  Christan Defranco, OTR/L Acute Care Rehab Services  Office [phone]   Kelli Churn 03/26/2020, 5:56 PM

## 2020-03-26 NOTE — Progress Notes (Signed)
Pt c/o pain 10/10. No PRN medication due at this time.Writer text paged attending to notify of pain score and to request medication for muscle spasms. One time dose given and pt resting well. Will continue to monitor

## 2020-03-26 NOTE — Progress Notes (Signed)
PROGRESS NOTE    Amber [name]  YIR:[id number] DOB: 06-25-1956 DOA: 03/24/2020 PCP: [name], MD    Chief Complaint  Patient presents with  . 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 with a PFO Patient was recently treated for urinary tract infection for 3 to 5 days And was recommended to continue the antibiotics but she started complaining of vomiting abdominal pain chest pain-she also has chronic back pain and is on medication for the same  Emergency room work-up showed lactic acidosis normal potassium 5.6 mild white count UA was positive however culture later came on as being negative CT chest as well as abdomen pelvis showed no abscess or abnormality other than moderate fecal retention  Assessment & Plan:   Principal Problem:   UTI (urinary tract infection) Active Problems:   Chest pain   SIRS (systemic inflammatory response syndrome) (HCC)   Abdominal pain   Diarrhea   SIRS on admission Initial urine culture is negative-blood cultures are still pending-continue empiric ceftriaxone until Blood cultures result-given her multiple abscesses in the past as well as immunocompromise state with rheumatoid-hesitate to send home without finalization at least 48 hours of blood cultures being negative  Atrial fibrillation CHADS2 score 5, has bled score >3 not on  anticoagulation Multiple CVAs with PFO and upper extremity DVTs Continue apixaban 5 twice  May need to consider aspirin as an antiplatelet agent given autoimmune state defer this to rheumatology as an outpatient She can continue metoprolol 25 XL daily  Rheumatoid arthritis cannot take DM ARDS-she has ductulitis Only on prednisone 5 which can itself cause immunosuppression Have reviewed the chart [facility] from [facility] and cannot see what DM ARDS she has been using in the past I will ask occupational therapy to see her to help her with strategies regarding her limitation of eating as she tells me she is unable to eat occasionally by herself and she seems to be total care at home  Prior seizure disorder Not currently on any antiseizure agent-monitor  Spinal abscess in the past, VATS procedure for mediastinal abscess in the past See above discussion regarding SIRS  Likely opioid induced constipation I will prescribe MiraLAX and see if this helps with his passing stool and we will schedule senna tonight in addition  HFpEF Prior chronic EtOH habituation  Chronic pain with back pain She has been seen by pain physician in the remote past-she is on OxyIR 10 every 6 as needed which seems to be her home dose-I will have to verify with pharmacy that the patient is taking the appropriate medications and I will asked them to verify doses of meds She is asking for muscle relaxant but until I get an update would hold off on the same  DVT prophylaxis:  Code Status:  Family Communication: Disposition:   Status is: Inpatient  Remains inpatient appropriate because:Unsafe d/c plan   Dispo: The patient is from: Home  Anticipated d/c is to: Likely home we will see if therapy can see her today for anticipation of discharge tomorrow              Anticipated d/c date is: 1 day              Patient currently is not medically stable to d/c.   Consultants:   None  currently  Procedures: None  Antimicrobials: None   Subjective: Awake alert coherent seems to be in good spirits sitting up in the chair without any nausea or vomiting She tells me she has a little bit of dysuria and tells me that sometimes she gets "a fungal infection" she is not having a vaginal discharge right now She is not having a cough or cold She has no abdominal pain  Tells me she is unable to manipulate various tools in her hand and is unable to eat at times she also tells me that she feels very constipated and stopped up today  Objective: Vitals:   03/25/20 1715 03/25/20 2017 03/25/20 2020 03/26/20 0032  BP: (!) 149/96 133/83  120/84  Pulse: 93 100 100 (!) 105  Resp: 18 18  16   Temp: 97.8 F (36.6 C)  98.2 F (36.8 C) 98.8 F (37.1 C)  TempSrc: Oral  Oral Oral  SpO2: 100% 100% 100% 100%  Weight:      Height:        Intake/Output Summary (Last 24 hours) at 03/26/2020 1112 Last data filed at 03/26/2020 1015 Gross per 24 hour  Intake 671.64 ml  Output 500 ml  Net 171.64 ml   Filed Weights   03/24/20 2130  Weight: 54.4 kg    Examination:  General exam: EOMI NCAT cushingoid appearance edentulous Respiratory system: Clinically clear no rales no rhonchi Cardiovascular system: S1-S2 no murmur rub or gallop-she is however in a flutter rate controlled on monitors Gastrointestinal system: Abdomen is slightly distended. Central nervous system: Moving all 4 limbs equally although she has some limitations to her upper extremities Extremities: She has dactylitis of her fingers Skin: None Psychiatry: Euthymic pleasant    Data Reviewed: I have personally reviewed following labs and imaging studies Potassium 4.1 BUN/creatinine 16/0.8 WBC 11.3 Hemoglobin down 8.9 Platelet count 80 Urine cultures negative for growth awaiting blood culture C. difficile testing not consistent with active infection  Radiology Studies: CT ABDOMEN PELVIS WO CONTRAST  Result Date:  03/24/2020 CLINICAL DATA:  Chest discomfort, nausea and vomiting EXAM: CT CHEST, ABDOMEN AND PELVIS WITHOUT CONTRAST TECHNIQUE: Multidetector CT imaging of the chest, abdomen and pelvis was performed following the standard protocol without IV contrast. COMPARISON:  03/21/2020 FINDINGS: CT CHEST FINDINGS Cardiovascular: Unenhanced imaging of the heart and great vessels demonstrates no pericardial effusion. Normal caliber of the thoracic aorta. Mild diffuse atherosclerosis. Mediastinum/Nodes: No enlarged mediastinal, hilar, or axillary lymph nodes. Thyroid gland, trachea, and esophagus demonstrate no significant findings. Lungs/Pleura: No acute airspace disease, effusion, or pneumothorax. Central airways are patent. Musculoskeletal: Multiple prior healed right-sided rib fractures. No acute bony abnormalities. Extensive postsurgical changes from thoracic fusion. Prior corpectomy at T4 and T5. CT ABDOMEN PELVIS FINDINGS Hepatobiliary: No focal liver abnormality is seen. Status post cholecystectomy. No biliary dilatation. Pancreas: Diffuse pancreatic atrophy. Calcifications consistent with sequela of chronic calcific pancreatitis. No acute inflammatory changes. Spleen: Normal in size without focal abnormality. Adrenals/Urinary Tract: No urinary tract calculi or obstructive uropathy. The bladder is unremarkable. The adrenals are normal. Stomach/Bowel: Scattered colonic diverticulosis without diverticulitis. No bowel obstruction or ileus.  Moderate stool throughout the colon. Vascular/Lymphatic: Aortic atherosclerosis. No enlarged abdominal or pelvic lymph nodes. Reproductive: Uterus and bilateral adnexa are unremarkable. Other: Stable fat containing left inguinal hernia. No abdominal wall hernia. No free fluid or free gas. Musculoskeletal: No acute or destructive bony lesions. Chronic T12, L1, and L3 compression deformities. Reconstructed images demonstrate no additional findings. IMPRESSION: 1. No acute intrathoracic,  intra-abdominal, or intrapelvic process. 2. Sequelae of chronic calcific pancreatitis. 3. Stable fat containing left inguinal hernia. 4. Moderate fecal retention. 5. Aortic Atherosclerosis (ICD10-I70.0). Electronically Signed   By: Randa Ngo M.D.   On: 03/24/2020 23:07   CT Chest Wo Contrast  Result Date: 03/24/2020 CLINICAL DATA:  Chest discomfort, nausea and vomiting EXAM: CT CHEST, ABDOMEN AND PELVIS WITHOUT CONTRAST TECHNIQUE: Multidetector CT imaging of the chest, abdomen and pelvis was performed following the standard protocol without IV contrast. COMPARISON:  03/21/2020 FINDINGS: CT CHEST FINDINGS Cardiovascular: Unenhanced imaging of the heart and great vessels demonstrates no pericardial effusion. Normal caliber of the thoracic aorta. Mild diffuse atherosclerosis. Mediastinum/Nodes: No enlarged mediastinal, hilar, or axillary lymph nodes. Thyroid gland, trachea, and esophagus demonstrate no significant findings. Lungs/Pleura: No acute airspace disease, effusion, or pneumothorax. Central airways are patent. Musculoskeletal: Multiple prior healed right-sided rib fractures. No acute bony abnormalities. Extensive postsurgical changes from thoracic fusion. Prior corpectomy at T4 and T5. CT ABDOMEN PELVIS FINDINGS Hepatobiliary: No focal liver abnormality is seen. Status post cholecystectomy. No biliary dilatation. Pancreas: Diffuse pancreatic atrophy. Calcifications consistent with sequela of chronic calcific pancreatitis. No acute inflammatory changes. Spleen: Normal in size without focal abnormality. Adrenals/Urinary Tract: No urinary tract calculi or obstructive uropathy. The bladder is unremarkable. The adrenals are normal. Stomach/Bowel: Scattered colonic diverticulosis without diverticulitis. No bowel obstruction or ileus. Moderate stool throughout the colon. Vascular/Lymphatic: Aortic atherosclerosis. No enlarged abdominal or pelvic lymph nodes. Reproductive: Uterus and bilateral adnexa are  unremarkable. Other: Stable fat containing left inguinal hernia. No abdominal wall hernia. No free fluid or free gas. Musculoskeletal: No acute or destructive bony lesions. Chronic T12, L1, and L3 compression deformities. Reconstructed images demonstrate no additional findings. IMPRESSION: 1. No acute intrathoracic, intra-abdominal, or intrapelvic process. 2. Sequelae of chronic calcific pancreatitis. 3. Stable fat containing left inguinal hernia. 4. Moderate fecal retention. 5. Aortic Atherosclerosis (ICD10-I70.0). Electronically Signed   By: Randa Ngo M.D.   On: 03/24/2020 23:07      Scheduled Meds: . allopurinol  300 mg Oral Daily  . apixaban  5 mg Oral BID  . atorvastatin  10 mg Oral q1800  . calcium-vitamin D  2 tablet Oral Q breakfast  . folic acid  1 mg Oral Daily  . lipase/protease/amylase  24,000 Units Oral TID with meals  . metoprolol succinate  25 mg Oral Daily  . pantoprazole  40 mg Oral BID AC  . predniSONE  5 mg Oral Q breakfast  . valproic acid  250 mg Oral TID   Continuous Infusions: . sodium chloride    . cefTRIAXone (ROCEPHIN)  IV       LOS: 1 day    Time spent: [place], MD [facility]   To contact the attending provider between 7A-7P or the covering provider during after hours 7P-7A, please log into the web site [email or web address] and access using universal [place] password for that web site. If you do not have the password, please call the hospital operator.  03/26/2020, 11:12 AM

## 2020-03-27 MED ORDER — OXYCODONE HCL 20 MG PO TABS: 10.0000 mg | ORAL_TABLET | Freq: Four times a day (QID) | ORAL | 0 refills | Status: AC | PRN

## 2020-03-27 MED ORDER — SENNOSIDES-DOCUSATE SODIUM 8.6-50 MG PO TABS: 1.0000 | ORAL_TABLET | Freq: Every day | ORAL | 0 refills | Status: DC

## 2020-03-27 MED ORDER — POLYETHYLENE GLYCOL 3350 17 G PO PACK: 17.0000 g | PACK | Freq: Every day | ORAL | 0 refills | Status: AC

## 2020-03-27 NOTE — TOC Progression Note (Signed)
Transition of Care [facility]) - Progression Note    Patient Details  Name: Amber [name] MRN: [id number] Date of Birth: 1956-11-16  [facility] CM/SW Contact  Armanda Heritage, RN Phone Number: 03/27/2020, 3:49 PM  Clinical Narrative: CM received notification that her granddaughter who helps care for her is out of town and requesting a delay in discharge as a result.  CM reached out to primary contact Amber [name], who is the patient's son.  Amber [name] reports he lives with the patient and helps care for her as well.  Per Amber [name] he is able to care for her when she goes home and is able to meet her needs today after discharge.   CM to arrange PTAR transport to home.       Expected Discharge Plan: Home w Home Health Services Barriers to Discharge: No Barriers Identified  Expected Discharge Plan and Services Expected Discharge Plan: Home w Home Health Services   Discharge Planning Services: CM Consult Post Acute Care Choice: Home Health Living arrangements for the past 2 months: Single Family Home Expected Discharge Date: 03/27/20               DME Arranged: N/A DME Agency: NA       HH Arranged: PT, OT HH Agency: [facility] Date HH Agency Contacted: 03/27/20 Time HH Agency Contacted: 1428 Representative spoke with at [facility] Agency: Barbara Cower   Social Determinants of Health (SDOH) Interventions    Readmission Risk Interventions No flowsheet data found.

## 2020-03-27 NOTE — TOC Initial Note (Signed)
Transition of Care [facility]) - Initial/Assessment Note    Patient Details  Name: Amber [name] MRN: [id number] Date of Birth: 17-Jun-1956  [facility]) CM/SW Contact:    Joaquin Courts, RN Phone Number: 03/27/2020, 2:29 PM  Clinical Narrative:                 Patient set up with [facility] for HHPT/OT.  Patient also active with [facility] for home palliative services.  [facility] rep notified of planned discharge.  Expected Discharge Plan: [place] Barriers to Discharge: No Barriers Identified   Patient Goals and CMS Choice Patient states their goals for this hospitalization and ongoing recovery are:: to go home CMS Medicare.gov Compare Post Acute Care list provided to:: Patient Choice offered to / list presented to : Patient  Expected Discharge Plan and Services Expected Discharge Plan: [place]   Discharge Planning Services: CM Consult Post Acute Care Choice: [place] arrangements for the past 2 months: Single Family Home Expected Discharge Date: 03/27/20               DME Arranged: N/A DME Agency: NA       HH Arranged: PT, OT [place] Agency: [place] ([place]) Date [place]: 03/27/20 Time [place]: [place] Representative spoke with at [place]: [place] Arrangements/Services Living arrangements for the past 2 months: [place]   Patient language and need for interpreter reviewed:: Yes Do you feel safe going back to the place where you live?: Yes            Criminal Activity/Legal Involvement Pertinent to Current Situation/Hospitalization: No - Comment as needed  Activities of Daily [place] Devices/Equipment: Wheelchair, Environmental consultant (specify type), Hospital bed, Other (Comment), Shower chair with back(tub/shower unit, manual wheelchair, 4 wheeled walker) ADL Screening (condition at time of admission) Patient's cognitive ability adequate to  safely complete daily activities?: No Is the patient deaf or have difficulty hearing?: No Does the patient have difficulty seeing, even when wearing glasses/contacts?: No Does the patient have difficulty concentrating, remembering, or making decisions?: Yes Patient able to express need for assistance with ADLs?: Yes Does the patient have difficulty dressing or bathing?: Yes Independently performs ADLs?: No Communication: Independent Dressing (OT): Needs assistance Is this a change from baseline?: Pre-admission baseline Grooming: Needs assistance Is this a change from baseline?: Pre-admission baseline Feeding: Needs assistance Is this a change from baseline?: Pre-admission baseline Bathing: Needs assistance Is this a change from baseline?: Pre-admission baseline Toileting: Dependent Is this a change from baseline?: Pre-admission baseline In/Out Bed: Dependent Is this a change from baseline?: Pre-admission baseline Walks in Home: Dependent Is this a change from baseline?: Pre-admission baseline Does the patient have difficulty walking or climbing stairs?: Yes(secondary to weakness) Weakness of Legs: Both(L>R, Left foot drop) Weakness of Arms/Hands: Left  Permission Sought/Granted                  Emotional Assessment              Admission diagnosis:  Cough [R05] Urinary frequency [R35.0] Diverticulitis [K57.92] Acute cystitis without hematuria [N30.00] Nausea vomiting and diarrhea [R11.2, R19.7] Abdominal pain, unspecified abdominal location [R10.9] Chest pain, unspecified type [R07.9] Patient Active Problem List   Diagnosis Date Noted  . UTI (urinary tract infection) 03/25/2020  . SIRS (systemic inflammatory response syndrome) ([place]) 03/25/2020  . Abdominal pain 03/25/2020  . Diarrhea 03/25/2020  . CVA (cerebral vascular  accident) (HCC) 02/13/2020  . Hyperkalemia   . Stroke (HCC) 12/25/2019  . Acute ischemic stroke (HCC) 12/24/2019  . Chronic diastolic  (congestive) heart failure (HCC) 02/05/2019  . CKD (chronic kidney disease) stage 3, GFR 30-59 ml/min 02/05/2019  . Chronic anticoagulation 02/05/2019  . Palliative care encounter   . Septic shock (HCC) 12/29/2018  . Pressure injury of skin 10/11/2018  . Sepsis due to pneumonia (HCC) 10/10/2018  . Failure to thrive in adult 10/10/2018  . Rheumatoid arthritis (HCC) 10/10/2018  . Anasarca 10/10/2018  . HCAP (healthcare-associated pneumonia) 09/24/2018  . Acute metabolic encephalopathy 05/09/2018  . Metabolic acidosis 05/09/2018  . Mild persistent asthma 05/09/2018  . 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 Moderate malnutrition (HCC) 05/07/2018  . Chronic anemia 05/06/2018  . PFO (patent foramen ovale) 02/15/2017  . History of CVA with residual deficit 02/15/2017  . Drug-induced hyperglycemia 02/15/2017  . Chest pain 11/23/2016  . Seizures (HCC) 10/26/2016  . 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 Sepsis (HCC) 02/29/2016  . Constipation 11/07/2015  . Hypomagnesemia 08/25/2015  . Diabetes mellitus type 2, controlled (HCC) 07/27/2015  . Chronic back pain   . Frequent falls   . Hypertensive heart disease   . Pulmonary hypertension (HCC)   . Gout   . Microcytic anemia 01/26/2015  . History of seizure 01/26/2015   PCP:  [name], MD Pharmacy:   [id number] - [place], [place] - [address] [place] [address] Phone: [phone] Fax: [phone]     Social Determinants of Health (SDOH) Interventions    Readmission Risk Interventions No flowsheet data found.

## 2020-03-27 NOTE — Discharge Summary (Signed)
Physician Discharge Summary  Amber [name] JGG:[id number] DOB: 1956-05-20 DOA: 03/24/2020  PCP: Amber Mariscal, MD  Admit date: 03/24/2020 Discharge date: 03/27/2020  Time spent: 35 minutes  Recommendations for Outpatient Follow-up:  1. Cut back oxycodone this admission from 20-10 dosing start MiraLAX senna 2. 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 with a PFO Patient was recently treated for urinary tract infection for 3 to 5 days And was recommended to continue the antibiotics but she started complaining of vomiting abdominal pain chest pain-she also has chronic back pain and is on medication for the same  Emergency room work-up showed lactic acidosis normal potassium 5.6 mild white count UA was positive  however culture later came on as being negative CT chest as well as abdomen pelvis showed no abscess or abnormality other than moderate fecal retention  Hospital Course:  SIRS on admission Urine cultures blood cultures -48 hours-ceftriaxone stopped She was stable on discharge  Atrial fibrillation CHADS2 score 5, has bled score >3 not on anticoagulation Multiple CVAs with PFO and upper extremity DVTs Continue apixaban 5 twice  May need to consider aspirin as an antiplatelet agent given autoimmune state defer this to rheumatology as an outpatient She can continue metoprolol 25 XL daily  Rheumatoid arthritis cannot take DM ARDS-she has ductulitis Only on prednisone 5 which can itself cause immunosuppression Have reviewed the chart extensively from [facility] and cannot see what DM ARDS she has been using in the past OT saw the patient gave her strategies to help manipulate her fingers (she has dactylitis) and I have ordered home health PT OT on discharge  Prior seizure disorder Not currently on any antiseizure agent-monitor  Spinal abscess in the past, VATS procedure for mediastinal abscess in the past See above discussion regarding SIRS  Likely opioid induced constipation I will prescribe MiraLAX and see if this helps with his passing stool and we will schedule senna tonight in addition  HFpEF Prior chronic EtOH habituation  Chronic pain with back pain She has been seen by pain physician in the remote past-she is on OxyIR 20 mg every 6 hours which I have recommended that she cut back to 10 every 6 as these can be detrimental long-term    Discharge Exam: Vitals:   03/26/20 2043 03/27/20 0530  BP: (!) 140/93 (!) 141/96  Pulse: 95 94  Resp: 20 18  Temp: 98.6 F (37 C) 98.6 F (37 C)  SpO2: 100% 100%    General: Awake coherent tells me that she is having abdominal pain but is eating a full breakfast as I am talking to her Cardiovascular: S1-S2 no murmur rub or  gallop Respiratory: Clinically clear no added sound no rales no rhonchi Abdomen obese nontender no rebound no guarding Neurologically intact Skin and musculoskeletal she has dactylitis  Discharge Instructions   Discharge Instructions    Diet - low sodium heart healthy   Complete by: As directed    Discharge instructions   Complete by: As directed    Please follow up with Rheumatologist in 1-2 weeks--she has disease that needs further care Cut back the oxycodone to 10 mg daily Take miralax and senna  Get labs in 1-2 weeks   Increase activity slowly   Complete by: As directed      Allergies as of 03/27/2020      Reactions   Ivp Dye [iodinated Diagnostic Agents] Itching   Metrizamide Itching      Medication List    STOP taking these medications   amoxicillin-clavulanate 875-125 MG tablet Commonly known as: AUGMENTIN     TAKE these medications   acetaminophen 500 MG tablet Commonly known as: TYLENOL Take 2 tablets (1,000 mg total) by mouth every 8 (eight) hours.   albuterol 108 (90 Base) MCG/ACT inhaler Commonly known as: VENTOLIN HFA Inhale 2 puffs into the lungs every 4 (four) hours as needed for wheezing or shortness of breath.   allopurinol 300 MG tablet Commonly known as: ZYLOPRIM Take 300 mg by mouth daily.   amLODipine 10 MG tablet Commonly known as: NORVASC Take 10 mg by mouth daily.   apixaban 5 MG Tabs tablet Commonly known as: ELIQUIS Take 1 tablet (5 mg total) by mouth 2 (two) times daily.   atorvastatin 10 MG tablet Commonly known as: LIPITOR Take 10 mg by mouth daily at 6 PM.   calcium-vitamin D 500-200 MG-UNIT tablet Commonly known as: OSCAL WITH D Take 2 tablets by mouth daily with breakfast.   Creon 24000-76000 units Cpep Generic drug: Pancrelipase (Lip-Prot-Amyl) Take 1 capsule by mouth 3 (three) times daily.   esomeprazole 40 MG capsule Commonly known as: NEXIUM Take 40 mg by mouth 2 (two) times daily before a meal.   folic acid 1  MG tablet Commonly known as: FOLVITE Take 1 mg by mouth daily.   furosemide 20 MG tablet Commonly known as: LASIX Take 20 mg by mouth daily as needed. What changed: Another medication with the same name was removed. Continue taking this medication, and follow the directions you see here.   metoprolol succinate 25 MG 24 hr tablet Commonly known as: TOPROL-XL Take 1 tablet (25 mg total) by mouth daily.   nystatin-triamcinolone ointment Commonly known as: MYCOLOG Apply 1 application topically 2 (two) times daily as needed for rash.   Oxycodone HCl 20 MG Tabs Take 0.5 tablets (10 mg total) by mouth every 6 (six) hours as needed (severe pain). What changed: how much to take   polyethylene glycol 17 g packet Commonly known as: MIRALAX / GLYCOLAX Take 17 g by mouth daily. Start taking on: Mar 28, 2020   predniSONE 5 MG tablet Commonly known as: DELTASONE Take 1 tablet (5 mg total) by mouth daily with breakfast.   senna-docusate 8.6-50 MG tablet Commonly known as: Senokot-S Take 1 tablet by mouth at bedtime.   valproic acid 250 MG capsule Commonly known as: DEPAKENE  Take 250 mg by mouth 3 (three) times daily.      Allergies  Allergen Reactions  . Ivp Dye [Iodinated Diagnostic Agents] Itching  . Metrizamide Itching      The results of significant diagnostics from this hospitalization (including imaging, microbiology, ancillary and laboratory) are listed below for reference.    Significant Diagnostic Studies: CT ABDOMEN PELVIS WO CONTRAST  Result Date: 03/24/2020 CLINICAL DATA:  Chest discomfort, nausea and vomiting EXAM: CT CHEST, ABDOMEN AND PELVIS WITHOUT CONTRAST TECHNIQUE: Multidetector CT imaging of the chest, abdomen and pelvis was performed following the standard protocol without IV contrast. COMPARISON:  03/21/2020 FINDINGS: CT CHEST FINDINGS Cardiovascular: Unenhanced imaging of the heart and great vessels demonstrates no pericardial effusion. Normal caliber of the  thoracic aorta. Mild diffuse atherosclerosis. Mediastinum/Nodes: No enlarged mediastinal, hilar, or axillary lymph nodes. Thyroid gland, trachea, and esophagus demonstrate no significant findings. Lungs/Pleura: No acute airspace disease, effusion, or pneumothorax. Central airways are patent. Musculoskeletal: Multiple prior healed right-sided rib fractures. No acute bony abnormalities. Extensive postsurgical changes from thoracic fusion. Prior corpectomy at T4 and T5. CT ABDOMEN PELVIS FINDINGS Hepatobiliary: No focal liver abnormality is seen. Status post cholecystectomy. No biliary dilatation. Pancreas: Diffuse pancreatic atrophy. Calcifications consistent with sequela of chronic calcific pancreatitis. No acute inflammatory changes. Spleen: Normal in size without focal abnormality. Adrenals/Urinary Tract: No urinary tract calculi or obstructive uropathy. The bladder is unremarkable. The adrenals are normal. Stomach/Bowel: Scattered colonic diverticulosis without diverticulitis. No bowel obstruction or ileus. Moderate stool throughout the colon. Vascular/Lymphatic: Aortic atherosclerosis. No enlarged abdominal or pelvic lymph nodes. Reproductive: Uterus and bilateral adnexa are unremarkable. Other: Stable fat containing left inguinal hernia. No abdominal wall hernia. No free fluid or free gas. Musculoskeletal: No acute or destructive bony lesions. Chronic T12, L1, and L3 compression deformities. Reconstructed images demonstrate no additional findings. IMPRESSION: 1. No acute intrathoracic, intra-abdominal, or intrapelvic process. 2. Sequelae of chronic calcific pancreatitis. 3. Stable fat containing left inguinal hernia. 4. Moderate fecal retention. 5. Aortic Atherosclerosis (ICD10-I70.0). Electronically Signed   By: Sharlet Salina M.D.   On: 03/24/2020 23:07   CT Chest Wo Contrast  Result Date: 03/24/2020 CLINICAL DATA:  Chest discomfort, nausea and vomiting EXAM: CT CHEST, ABDOMEN AND PELVIS WITHOUT CONTRAST  TECHNIQUE: Multidetector CT imaging of the chest, abdomen and pelvis was performed following the standard protocol without IV contrast. COMPARISON:  03/21/2020 FINDINGS: CT CHEST FINDINGS Cardiovascular: Unenhanced imaging of the heart and great vessels demonstrates no pericardial effusion. Normal caliber of the thoracic aorta. Mild diffuse atherosclerosis. Mediastinum/Nodes: No enlarged mediastinal, hilar, or axillary lymph nodes. Thyroid gland, trachea, and esophagus demonstrate no significant findings. Lungs/Pleura: No acute airspace disease, effusion, or pneumothorax. Central airways are patent. Musculoskeletal: Multiple prior healed right-sided rib fractures. No acute bony abnormalities. Extensive postsurgical changes from thoracic fusion. Prior corpectomy at T4 and T5. CT ABDOMEN PELVIS FINDINGS Hepatobiliary: No focal liver abnormality is seen. Status post cholecystectomy. No biliary dilatation. Pancreas: Diffuse pancreatic atrophy. Calcifications consistent with sequela of chronic calcific pancreatitis. No acute inflammatory changes. Spleen: Normal in size without focal abnormality. Adrenals/Urinary Tract: No urinary tract calculi or obstructive uropathy. The bladder is unremarkable. The adrenals are normal. Stomach/Bowel: Scattered colonic diverticulosis without diverticulitis. No bowel obstruction or ileus. Moderate stool throughout the colon. Vascular/Lymphatic: Aortic atherosclerosis. No enlarged abdominal or pelvic lymph nodes. Reproductive: Uterus and bilateral adnexa are unremarkable. Other: Stable fat containing left inguinal hernia. No abdominal wall hernia. No free fluid or free gas. Musculoskeletal: No acute or destructive bony lesions.  Chronic T12, L1, and L3 compression deformities. Reconstructed images demonstrate no additional findings. IMPRESSION: 1. No acute intrathoracic, intra-abdominal, or intrapelvic process. 2. Sequelae of chronic calcific pancreatitis. 3. Stable fat containing left  inguinal hernia. 4. Moderate fecal retention. 5. Aortic Atherosclerosis (ICD10-I70.0). Electronically Signed   By: Sharlet Salina M.D.   On: 03/24/2020 23:07    Microbiology: Recent Results (from the past 240 hour(s))  Blood culture (routine x 2)     Status: None (Preliminary result)   Collection Time: 03/24/20 10:24 PM   Specimen: BLOOD  Result Value Ref Range Status   Specimen Description   Final    BLOOD BLOOD LEFT FOREARM Performed at [facility], [address] [place], [place] [address]    Special Requests   Final    BOTTLES DRAWN AEROBIC AND ANAEROBIC Blood Culture results may not be optimal due to an inadequate volume of blood received in culture bottles Performed at [facility], [address] [place], [place] [address]    Culture   Final    NO GROWTH 2 DAYS Performed at [facility], [address] [place], [place] [address]    Report Status PENDING  Incomplete  Blood culture (routine x 2)     Status: None (Preliminary result)   Collection Time: 03/24/20 10:32 PM   Specimen: BLOOD  Result Value Ref Range Status   Specimen Description   Final    BLOOD BLOOD RIGHT FOREARM Performed at [facility], [address], [place], [place] [address]    Special Requests   Final    BOTTLES DRAWN AEROBIC AND ANAEROBIC Blood Culture adequate volume Performed at [facility], [address] [place], [place] [address]    Culture   Final    NO GROWTH 2 DAYS Performed at [facility], [address] [place], [place] [address]    Report Status PENDING  Incomplete  Urine culture     Status: None   Collection Time: 03/25/20  1:23 AM   Specimen: Urine, Catheterized  Result Value Ref Range Status   Specimen Description   Final    Urine Performed at [facility], [address], [place], [place] [address]    Special Requests   Final    NONE Performed at [facility], [address], [place], [place] [address]    Culture   Final    NO GROWTH Performed at [facility], [address], [place], [place] [address]    Report Status 03/25/2020 FINAL  Final  Respiratory Panel by RT PCR (Flu A&B, Covid) - Nasopharyngeal Swab     Status: None   Collection Time: 03/25/20  2:51 AM   Specimen: Nasopharyngeal Swab  Result Value Ref Range Status   SARS Coronavirus 2 by RT PCR NEGATIVE NEGATIVE Final    Comment: (NOTE) SARS-CoV-2 target nucleic acids are NOT DETECTED. The SARS-CoV-2 RNA is generally detectable in upper respiratoy specimens during the acute phase of infection. The lowest concentration of SARS-CoV-2 viral copies this assay can detect is 131 copies/mL. A negative result does not preclude SARS-Cov-2 infection and should not be used as the sole basis for treatment or other patient management decisions. A negative result may occur with  improper specimen collection/handling, submission of specimen other than nasopharyngeal swab, presence of viral mutation(s) within the areas targeted by this assay, and inadequate number of viral copies (<131 copies/mL). A negative result  must be combined with clinical observations, patient history, and epidemiological information. The expected result is Negative. Fact Sheet for Patients:  https://www.moore.com/ Fact Sheet for Healthcare Providers:  https://www.young.biz/ This test is not yet ap proved or cleared by the [place] FDA and  has been authorized for detection and/or diagnosis of SARS-CoV-2 by FDA under an Emergency Use Authorization (EUA). This EUA will remain  in effect (meaning this test can be used) for the duration of the COVID-19 declaration under Section 564(b)(1) of the Act, 21 U.S.C. section 360bbb-3(b)(1), unless the authorization is terminated or revoked sooner.    Influenza A by PCR NEGATIVE NEGATIVE Final   Influenza B  by PCR NEGATIVE NEGATIVE Final    Comment: (NOTE) The Xpert Xpress SARS-CoV-2/FLU/RSV assay is intended as an aid in  the diagnosis of influenza from Nasopharyngeal swab specimens and  should not be used as a sole basis for treatment. Nasal washings and  aspirates are unacceptable for Xpert Xpress SARS-CoV-2/FLU/RSV  testing. Fact Sheet for Patients: https://www.moore.com/ Fact Sheet for Healthcare Providers: https://www.young.biz/ This test is not yet approved or cleared by the [place] FDA and  has been authorized for detection and/or diagnosis of SARS-CoV-2 by  FDA under an Emergency Use Authorization (EUA). This EUA will remain  in effect (meaning this test can be used) for the duration of the  Covid-19 declaration under Section 564(b)(1) of the Act, 21  U.S.C. section 360bbb-3(b)(1), unless the authorization is  terminated or revoked. Performed at [facility], [address], [place], [place] [address]   MRSA PCR Screening     Status: None   Collection Time: 03/25/20  1:06 PM   Specimen: Nasopharyngeal  Result Value Ref Range Status   MRSA by PCR NEGATIVE NEGATIVE Final    Comment:        The GeneXpert MRSA Assay (FDA approved for NASAL specimens only), is one component of a comprehensive MRSA colonization surveillance program. It is not intended to diagnose MRSA infection nor to guide or monitor treatment for MRSA infections. Performed at [facility], [address] [place], [place] [address]   C Difficile Quick Screen w PCR reflex     Status: Abnormal   Collection Time: 03/25/20  3:59 PM   Specimen: STOOL  Result Value Ref Range Status   C Diff antigen POSITIVE (A) NEGATIVE Final   C Diff toxin NEGATIVE NEGATIVE Final   C Diff interpretation Results are indeterminate. See PCR results.  Final    Comment: Performed at [facility], [address], [place],  [place] [address]  C. Diff by PCR, Reflexed     Status: None   Collection Time: 03/25/20  3:59 PM  Result Value Ref Range Status   Toxigenic C. Difficile by PCR NEGATIVE NEGATIVE Final    Comment: Patient is colonized with non toxigenic C. difficile. May not need treatment unless significant symptoms are present. Performed at [facility], [address] [place], [place] [address]      Labs: Basic Metabolic Panel: Recent Labs  Lab 03/24/20 2224 03/25/20 0633  NA 143 144  K 5.6* 4.1  CL 111 113*  CO2 25 24  GLUCOSE 103* 134*  BUN 17 16  CREATININE 0.98 0.86  CALCIUM 8.0* 7.9*   Liver Function Tests: Recent Labs  Lab 03/24/20 2224  AST 42*  ALT 24  ALKPHOS 104  BILITOT 0.5  PROT 5.7*  ALBUMIN 2.6*   Recent Labs  Lab  03/24/20 2224  LIPASE 20   No results for input(s): AMMONIA in the last 168 hours. CBC: Recent Labs  Lab 03/24/20 2224 03/25/20 0633  WBC 13.3* 11.3*  NEUTROABS 9.0*  --   HGB 10.3* 8.9*  HCT 31.4* 27.4*  MCV 80.5 81.5  PLT 108* 80*   Cardiac Enzymes: No results for input(s): CKTOTAL, CKMB, CKMBINDEX, TROPONINI in the last 168 hours. BNP: BNP (last 3 results) No results for input(s): BNP in the last 8760 hours.  ProBNP (last 3 results) No results for input(s): PROBNP in the last 8760 hours.  CBG: Recent Labs  Lab 03/25/20 0517  GLUCAP 75       Signed:  Rhetta Mura MD   [facility] 03/27/2020, 10:24 AM

## 2020-03-27 NOTE — Evaluation (Signed)
Physical Therapy Evaluation Patient Details Name: Amber [name] MRN: [id number] DOB: Nov 24, 1955 Today's Date: 03/27/2020   History of Present Illness  64 y.o. female with medical history significant of CAD, asthma, diabetes, stroke, frequent falls, GERD, hypertension, seizures, rheumatoid arthritis presenting with complaints of abdominal pain, nausea, vomiting, and chest pain.  Clinical Impression   Pt presents with generalized weakness, difficulty performing mobility tasks, impaired sitting and standing balance especially dynamically, impaired gait, and decreased activity tolerance. Pt to benefit from acute PT to address deficits. Pt tolerated repeated transfer training in order to have BM on BSC, requiring PT min assist throughout as well as pericare assist. Pt ambulated short distance, which pt describes is the current extent of tolerance. PT recommending HHPT with 24/7 assist from family, pt stating someone is always assisting her with mobility because "I don't want to end up on the floor". PT to progress mobility as tolerated, and will continue to follow acutely.      Follow Up Recommendations Home health PT;Supervision/Assistance - 24 hour    Equipment Recommendations  None recommended by PT    Recommendations for Other Services       Precautions / Restrictions Precautions Precautions: Fall Restrictions Weight Bearing Restrictions: No      Mobility  Bed Mobility Overal bed mobility: Needs Assistance Bed Mobility: Supine to Sit     Supine to sit: Min assist;HOB elevated     General bed mobility comments: assist for trunk elevation, scooting to EOB. Pt with use of bedrails to complete.  Transfers Overall transfer level: Needs assistance Equipment used: Rolling walker (2 wheeled) Transfers: Sit to/from [facility] Sit to Stand: Min assist Stand pivot transfers: Min assist       General transfer comment: min assist for power up, steadying, correcting  posterior leaning, and pivot to [facility] for pt and RW guiding. Increased time and effort, sit to stand x2 from EOB and from [facility].  Ambulation/Gait Ambulation/Gait assistance: Min assist Gait Distance (Feet): 5 Feet Assistive device: Rolling walker (2 wheeled) Gait Pattern/deviations: Step-through pattern;Decreased stride length;Trunk flexed;Decreased dorsiflexion - left Gait velocity: decr   General Gait Details: min assist to steady, verbal cuing for upright posture, keeping RW close to self.  Stairs            Wheelchair Mobility    Modified Rankin (Stroke Patients Only)       Balance Overall balance assessment: Needs assistance Sitting-balance support: No upper extremity supported;Feet unsupported Sitting balance-Leahy Scale: Fair     Standing balance support: Bilateral upper extremity supported Standing balance-Leahy Scale: Poor Standing balance comment: reliant on external support of RW and PT assist in dynamic standing                             Pertinent Vitals/Pain Pain Assessment: Faces Faces Pain Scale: Hurts little more Pain Location: my stomach (need to have BM) Pain Descriptors / Indicators: Sore;Discomfort Pain Intervention(s): Limited activity within patient's tolerance;Monitored during session;Repositioned;Patient requesting pain meds-RN notified    Home Living Family/patient expects to be discharged to:: Private residence Living Arrangements: Children;Other relatives Available Help at Discharge: Family;Personal care attendant;Available 24 hours/day Type of Home: Apartment Home Access: Stairs to enter Entrance Stairs-Rails: Right Entrance Stairs-Number of Steps: 5 Home Layout: One level Home Equipment: Clinical cytogeneticist - 4 wheels;Wheelchair - manual;Hospital bed;Bedside commode Additional Comments: pt states her daughter, granddaughter, and son can combined assist her 24/7.    Prior Function Level of  Independence: Needs assistance    Gait / Transfers Assistance Needed: Patient reports minimal ambulation. Reports she mostly just transfers.  ADL's / Homemaking Assistance Needed: HHA 2 hours/day 7 days/week.        Hand Dominance   Dominant Hand: Right    Extremity/Trunk Assessment   Upper Extremity Assessment Upper Extremity Assessment: Defer to OT evaluation    Lower Extremity Assessment Lower Extremity Assessment: LLE deficits/detail;RLE deficits/detail RLE Deficits / Details: at least 3/5, better ease of movement vs L LLE Deficits / Details: tightness in hip and knee flexion, 3/5 strength    Cervical / Trunk Assessment Cervical / Trunk Assessment: Normal  Communication   Communication: No difficulties  Cognition Arousal/Alertness: Awake/alert Behavior During Therapy: WFL for tasks assessed/performed Overall Cognitive Status: Within Functional Limits for tasks assessed                                        General Comments General comments (skin integrity, edema, etc.): Pt required assist from PT for BM pericare post-toileting on BSC.    Exercises     Assessment/Plan    PT Assessment Patient needs continued PT services  PT Problem List Decreased strength;Decreased mobility;Decreased safety awareness;Decreased activity tolerance;Decreased balance;Decreased knowledge of use of DME;Pain;Decreased range of motion;Impaired tone       PT Treatment Interventions DME instruction;Therapeutic activities;Gait training;Therapeutic exercise;Patient/family education;Stair training;Balance training;Functional mobility training;Neuromuscular re-education    PT Goals (Current goals can be found in the Care Plan section)  Acute Rehab PT Goals Patient Stated Goal: get better and stronger PT Goal Formulation: With patient Time For Goal Achievement: 04/10/20 Potential to Achieve Goals: Good    Frequency Min 3X/week   Barriers to discharge        Co-evaluation                AM-PAC PT "6 Clicks" Mobility  Outcome Measure Help needed turning from your back to your side while in a flat bed without using bedrails?: A Little Help needed moving from lying on your back to sitting on the side of a flat bed without using bedrails?: A Lot Help needed moving to and from a bed to a chair (including a wheelchair)?: A Little Help needed standing up from a chair using your arms (e.g., wheelchair or bedside chair)?: A Little Help needed to walk in hospital room?: A Little Help needed climbing 3-5 steps with a railing? : A Little 6 Click Score: 17    End of Session Equipment Utilized During Treatment: Gait belt Activity Tolerance: Patient tolerated treatment well;Patient limited by fatigue Patient left: in chair;with chair alarm set;with call bell/phone within reach Nurse Communication: Mobility status PT Visit Diagnosis: Other abnormalities of gait and mobility (R26.89);Muscle weakness (generalized) (M62.81)    Time: 9833-8250 PT Time Calculation (min) (ACUTE ONLY): 26 min   Charges:   PT Evaluation $PT Eval Low Complexity: 1 Low PT Treatments $Therapeutic Activity: 8-22 mins       Rennee Coyne E, PT Acute Rehabilitation Services Pager [phone]  Office [phone]   Quenisha Lovins D [name] Fiallos 03/27/2020, 3:08 PM

## 2020-03-27 NOTE — Progress Notes (Signed)
[facility] Documentation    Pt is a current pt in [facility] Palliative program. Liaison to follow pt through course of hospital stay to ensure follow up with Palliative NP upon discharge.     Please outreach with any questions.     Thank you,   Jennifer Love, RN  [facility] Hospital Liaison  [phone]   

## 2020-03-28 LAB — GI PATHOGEN PANEL BY PCR, STOOL
Adenovirus F 40/41: NOT DETECTED
Astrovirus: NOT DETECTED
Campylobacter by PCR: NOT DETECTED
Cryptosporidium by PCR: NOT DETECTED
Cyclospora cayetanensis: NOT DETECTED
E coli (ETEC) LT/ST: NOT DETECTED
E coli (STEC): NOT DETECTED
Entamoeba histolytica: NOT DETECTED
Enteroaggregative E coli: NOT DETECTED
Enteropathogenic E coli: NOT DETECTED
G lamblia by PCR: NOT DETECTED
Norovirus GI/GII: DETECTED — AB
Plesiomonas shigelloides: NOT DETECTED
Rotavirus A by PCR: NOT DETECTED
Salmonella by PCR: NOT DETECTED
Sapovirus: NOT DETECTED
Shigella by PCR: NOT DETECTED
Vibrio cholerae: NOT DETECTED
Vibrio: NOT DETECTED
Yersinia enterocolitica: NOT DETECTED

## 2020-03-29 ENCOUNTER — Encounter (HOSPITAL_COMMUNITY): Payer: Self-pay | Admitting: Emergency Medicine

## 2020-03-29 ENCOUNTER — Emergency Department (HOSPITAL_COMMUNITY)
Admission: EM | Admit: 2020-03-29 | Discharge: 2020-03-29 | Disposition: A | Discharge status: Home / Self Care | Payer: Medicaid Other | Attending: Emergency Medicine | Admitting: Emergency Medicine

## 2020-03-29 ENCOUNTER — Emergency Department (HOSPITAL_COMMUNITY)
Admit: 2020-03-29 | Discharge: 2020-03-29 | Disposition: A | Discharge status: Home / Self Care | Payer: Medicaid Other | Attending: Internal Medicine | Admitting: Internal Medicine

## 2020-03-29 ENCOUNTER — Other Ambulatory Visit: Payer: Self-pay

## 2020-03-29 DIAGNOSIS — R1084 Generalized abdominal pain: Secondary | ICD-10-CM

## 2020-03-29 DIAGNOSIS — Z87891 Personal history of nicotine dependence: Secondary | ICD-10-CM | POA: Insufficient documentation

## 2020-03-29 DIAGNOSIS — K5903 Drug induced constipation: Secondary | ICD-10-CM

## 2020-03-29 DIAGNOSIS — K5900 Constipation, unspecified: Secondary | ICD-10-CM | POA: Insufficient documentation

## 2020-03-29 DIAGNOSIS — I639 Cerebral infarction, unspecified: Secondary | ICD-10-CM | POA: Insufficient documentation

## 2020-03-29 DIAGNOSIS — R109 Unspecified abdominal pain: Secondary | ICD-10-CM | POA: Diagnosis present

## 2020-03-29 DIAGNOSIS — E119 Type 2 diabetes mellitus without complications: Secondary | ICD-10-CM | POA: Diagnosis not present

## 2020-03-29 DIAGNOSIS — I10 Essential (primary) hypertension: Secondary | ICD-10-CM | POA: Diagnosis not present

## 2020-03-29 LAB — CBC
HCT: 27.5 % — ABNORMAL LOW (ref 36.0–46.0)
Hemoglobin: 8.7 g/dL — ABNORMAL LOW (ref 12.0–15.0)
MCH: 25.9 pg — ABNORMAL LOW (ref 26.0–34.0)
MCHC: 31.6 g/dL (ref 30.0–36.0)
MCV: 81.8 fL (ref 80.0–100.0)
Platelets: 98 10*3/uL — ABNORMAL LOW (ref 150–400)
RBC: 3.36 MIL/uL — ABNORMAL LOW (ref 3.87–5.11)
RDW: 16.2 % — ABNORMAL HIGH (ref 11.5–15.5)
WBC: 10.2 10*3/uL (ref 4.0–10.5)
nRBC: 0 % (ref 0.0–0.2)

## 2020-03-29 LAB — COMPREHENSIVE METABOLIC PANEL
ALT: 12 U/L (ref 0–44)
AST: 28 U/L (ref 15–41)
Albumin: 2.1 g/dL — ABNORMAL LOW (ref 3.5–5.0)
Alkaline Phosphatase: 80 U/L (ref 38–126)
Anion gap: 9 (ref 5–15)
BUN: 20 mg/dL (ref 8–23)
CO2: 24 mmol/L (ref 22–32)
Calcium: 8.7 mg/dL — ABNORMAL LOW (ref 8.9–10.3)
Chloride: 106 mmol/L (ref 98–111)
Creatinine, Ser: 1.14 mg/dL — ABNORMAL HIGH (ref 0.44–1.00)
GFR calc Af Amer: >60 mL/min (ref >60)
GFR calc non Af Amer: 52 mL/min — ABNORMAL LOW (ref >60)
Glucose, Bld: 166 mg/dL — ABNORMAL HIGH (ref 70–99)
Potassium: 5.6 mmol/L — ABNORMAL HIGH (ref 3.5–5.1)
Sodium: 139 mmol/L (ref 135–145)
Total Bilirubin: 0.6 mg/dL (ref 0.3–1.2)
Total Protein: 5.4 g/dL — ABNORMAL LOW (ref 6.5–8.1)

## 2020-03-29 LAB — URINALYSIS, ROUTINE W REFLEX MICROSCOPIC
Bacteria, UA: NONE SEEN
Bilirubin Urine: NEGATIVE
Glucose, UA: NEGATIVE mg/dL
Hgb urine dipstick: NEGATIVE
Ketones, ur: NEGATIVE mg/dL
Nitrite: NEGATIVE
Protein, ur: 30 mg/dL — AB
Specific Gravity, Urine: 1.025 (ref 1.005–1.030)
pH: 5 (ref 5.0–8.0)

## 2020-03-29 LAB — LACTIC ACID, PLASMA: Lactic Acid, Venous: 1.3 mmol/L (ref 0.5–1.9)

## 2020-03-29 LAB — LIPASE, BLOOD: Lipase: 19 U/L (ref 11–51)

## 2020-03-29 MED ORDER — SODIUM CHLORIDE 0.9 % IV BOLUS
500.0000 mL | Freq: Once | INTRAVENOUS | Status: AC
  Administered 2020-03-29: 500 mL via INTRAVENOUS

## 2020-03-29 MED ORDER — SODIUM CHLORIDE 0.9% FLUSH: 3.0000 mL | Freq: Once | INTRAVENOUS | Status: DC

## 2020-03-29 MED ORDER — LINACLOTIDE 145 MCG PO CAPS: 145.0000 ug | ORAL_CAPSULE | Freq: Every day | ORAL | 1 refills | Status: AC

## 2020-03-29 NOTE — ED Notes (Signed)
xr contact that pt is ready

## 2020-03-29 NOTE — ED Notes (Signed)
Attempted to contact pt son with no answer.

## 2020-03-29 NOTE — Discharge Instructions (Addendum)
Follow up with your Physician  

## 2020-03-29 NOTE — ED Provider Notes (Signed)
Pt's care assumed at 7am.  Xray pending.  Xray shows constipation.  Lactate is normal.  Pt given rx for linzess.  I suspect pt's pain is from narcotic induced constipation.     Elson Areas, Cordelia Poche 03/29/20 0908    Pollyann Savoy, MD 03/29/20 [phone]

## 2020-03-29 NOTE — ED Triage Notes (Signed)
Pt brought to ED by [facility] for abd pain for the past 5 days was seen yesterday for same at [facility] sent home with diagnose of UTI. Per daughter she needs to be reevaluated. BP 124/88, HR 120, R 20, SPO2 98% RA.

## 2020-03-29 NOTE — ED Notes (Signed)
Report given to PTAR. Son updated. Patient Alert and oriented to baseline. Stable and ambulatory to baseline. Patient verbalized understanding of the discharge instructions.  Patient belongings were taken by the patient.

## 2020-03-29 NOTE — ED Notes (Signed)
Amber [name]- [phone]- Would like pt updates

## 2020-03-29 NOTE — ED Provider Notes (Signed)
[facility] EMERGENCY DEPARTMENT Provider Note   CSN: [id number] Arrival date & time: 03/29/20  0032     History Chief Complaint  Patient presents with  . Abdominal Pain    Amber [name] is a 64 y.o. female.  Patient with history of rheumatoid arthritis, DM, HTN, CVA (embolic), CKD, ACS, chronic pancreatitis, anemia, presents to the ED with abdominal pain and constipation. She reports symptoms started about 3 weeks ago with abdominal pain, diarrhea and N/V. She does not believe she has had a fever at any time. Over the last 3 weeks, she has gone from diarrhea to constipation and nausea/vomiting have resolved, but her pain continues. She was admitted 5/5 thru 5/8 for ongoing evaluation of her symptoms and she reports that since discharge her pain has been unrelenting. She complains of persistent dysuria and chronic back pain.  The history is provided by the patient. No language interpreter was used.  Abdominal Pain Associated symptoms: constipation and dysuria   Associated symptoms: no chest pain, no chills, no cough, no diarrhea, no fever, no nausea, no shortness of breath and no vomiting        Past Medical History:  Diagnosis Date  . ACS (acute coronary syndrome) (HCC) 06/12/2015  . Acute on chronic blood loss anemia 11/25/2017  . Arthritis   . Asthma   . 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 Chronic pain   . Constipation   . Diabetes mellitus without complication (HCC)   . Duodenitis   . Embolic stroke (HCC)   . Falls frequently   . Focal seizure (HCC)   . GERD (gastroesophageal reflux disease)   . Hypertension   . Impacted fracture 02/15/2017   right glenoid  . Pneumonia 10/2015  . Seizures (HCC)    last seizure March 2015  . Small bowel obstruction (HCC) 11/25/2017    Patient Active Problem List   Diagnosis Date Noted  . UTI (urinary tract infection) 03/25/2020  . SIRS (systemic inflammatory response syndrome) (HCC)  03/25/2020  . Abdominal pain 03/25/2020  . Diarrhea 03/25/2020  . CVA (cerebral vascular accident) (HCC) 02/13/2020  . Hyperkalemia   . Stroke (HCC) 12/25/2019  . Acute ischemic stroke (HCC) 12/24/2019  . Chronic diastolic (congestive) heart failure (HCC) 02/05/2019  . CKD (chronic kidney disease) stage 3, GFR 30-59 ml/min 02/05/2019  . Chronic anticoagulation 02/05/2019  . Palliative care encounter   . Septic shock (HCC) 12/29/2018  . Pressure injury of skin 10/11/2018  . Sepsis due to pneumonia (HCC) 10/10/2018  . Failure to thrive in adult 10/10/2018  . Rheumatoid arthritis (HCC) 10/10/2018  . Anasarca 10/10/2018  . HCAP (healthcare-associated pneumonia) 09/24/2018  . Acute metabolic encephalopathy 05/09/2018  . Metabolic acidosis 05/09/2018  . Mild persistent asthma 05/09/2018  . 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 Moderate malnutrition (HCC) 05/07/2018  . Chronic anemia 05/06/2018  . PFO (patent foramen ovale) 02/15/2017  . History of CVA with residual deficit 02/15/2017  . Drug-induced hyperglycemia 02/15/2017  . Chest pain 11/23/2016  . Seizures (HCC) 10/26/2016  . 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 Sepsis (HCC) 02/29/2016  . Constipation 11/07/2015  . Hypomagnesemia 08/25/2015  . Diabetes mellitus type 2, controlled (HCC) 07/27/2015  . Chronic back pain   . Frequent falls   . Hypertensive heart disease   . Pulmonary hypertension (HCC)   . Gout   . Microcytic anemia 01/26/2015  . History of seizure 01/26/2015    Past Surgical History:  Procedure Laterality Date  . APPENDECTOMY    . BACK SURGERY    . CHOLECYSTECTOMY    . TEE WITHOUT CARDIOVERSION N/A 10/30/2016   Procedure: TRANSESOPHAGEAL ECHOCARDIOGRAM (TEE);  Surgeon: Lars Masson, MD;  Location: [facility] ENDOSCOPY;  Service: Cardiovascular;  Laterality: N/A;     OB History   No obstetric history on file.     Family History  Problem Relation Age of Onset  . Kidney failure  Mother   . Hypertension Sister   . Hypertension Brother     Social History   Tobacco Use  . Smoking status: Former Smoker    Quit date: 2010    Years since quitting: 11.3  . Smokeless tobacco: Never Used  Substance Use Topics  . Alcohol use: No    Comment: admits to 2 drinks/week  . Drug use: No    Home Medications Prior to Admission medications   Medication Sig Start Date End Date Taking? Authorizing Provider  acetaminophen (TYLENOL) 500 MG tablet Take 2 tablets (1,000 mg total) by mouth every 8 (eight) hours. 12/30/19   Almon Hercules, MD  albuterol (PROVENTIL HFA;VENTOLIN HFA) 108 (90 Base) MCG/ACT inhaler Inhale 2 puffs into the lungs every 4 (four) hours as needed for wheezing or shortness of breath. 01/16/19   Glade Lloyd, MD  allopurinol (ZYLOPRIM) 300 MG tablet Take 300 mg by mouth daily. 11/11/16   [provider]  amLODipine (NORVASC) 10 MG tablet Take 10 mg by mouth daily.    [provider]  apixaban (ELIQUIS) 5 MG TABS tablet Take 1 tablet (5 mg total) by mouth 2 (two) times daily. 02/14/20   Joseph Art, DO  atorvastatin (LIPITOR) 10 MG tablet Take 10 mg by mouth daily at 6 PM.  08/13/15   [provider]  calcium-vitamin D (OSCAL WITH D) 500-200 MG-UNIT tablet Take 2 tablets by mouth daily with breakfast. 12/30/19   Almon Hercules, MD  CREON 24000-76000 units CPEP Take 1 capsule by mouth 3 (three) times daily.  09/23/19   [provider]  esomeprazole (NEXIUM) 40 MG capsule Take 40 mg by mouth 2 (two) times daily before a meal.  11/26/19   [provider]  folic acid (FOLVITE) 1 MG tablet Take 1 mg by mouth daily. 11/20/19   [provider]  furosemide (LASIX) 20 MG tablet Take 20 mg by mouth daily as needed.    [provider]  metoprolol succinate (TOPROL-XL) 25 MG 24 hr tablet Take 1 tablet (25 mg total) by mouth daily. 10/15/18   Mikhail, Nita Sells, DO  nystatin-triamcinolone ointment (MYCOLOG) Apply 1  application topically 2 (two) times daily as needed for rash. 09/23/19   [provider]  Oxycodone HCl 20 MG TABS Take 0.5 tablets (10 mg total) by mouth every 6 (six) hours as needed (severe pain). 03/27/20   Rhetta Mura, MD  polyethylene glycol (MIRALAX / GLYCOLAX) 17 g packet Take 17 g by mouth daily. 03/28/20   Rhetta Mura, MD  predniSONE (DELTASONE) 5 MG tablet Take 1 tablet (5 mg total) by mouth daily with breakfast. 01/16/19   Glade Lloyd, MD  senna-docusate (SENOKOT-S) 8.6-50 MG tablet Take 1 tablet by mouth at bedtime. 03/27/20   Rhetta Mura, MD  valproic acid (DEPAKENE) 250 MG capsule Take 250 mg by mouth 3 (three) times daily.     [provider]    Allergies    Ivp dye [iodinated diagnostic agents] and Metrizamide  Review of Systems   Review  of Systems  Constitutional: Negative for chills and fever.  HENT: Negative.   Respiratory: Negative.  Negative for cough and shortness of breath.   Cardiovascular: Negative.  Negative for chest pain.  Gastrointestinal: Positive for abdominal pain and constipation. Negative for diarrhea, nausea and vomiting.  Genitourinary: Positive for dysuria.  Musculoskeletal: Positive for back pain.  Skin: Negative.   Neurological: Negative.     Physical Exam Updated Vital Signs BP 111/80 (BP Location: Right Arm)   Pulse 95   Temp 99.2 F (37.3 C) (Oral)   Resp 15   Ht 5' (1.524 m)   Wt 54.4 kg   SpO2 94%   BMI 23.42 kg/m   Physical Exam Vitals and nursing note reviewed.  Constitutional:      Appearance: She is well-developed.     Comments: Chronically ill appearing, frail 64 yo patient  HENT:     Head: Normocephalic.  Cardiovascular:     Rate and Rhythm: Normal rate and regular rhythm.  Pulmonary:     Effort: Pulmonary effort is normal.     Breath sounds: Rales present.  Chest:     Chest wall: No tenderness.  Abdominal:     General: Bowel sounds are decreased. There is distension.      Palpations: Abdomen is soft.     Tenderness: There is generalized abdominal tenderness. There is no guarding or rebound.  Musculoskeletal:        General: Normal range of motion.     Cervical back: Normal range of motion and neck supple.  Skin:    General: Skin is warm and dry.     Findings: No rash.  Neurological:     General: No focal deficit present.     Mental Status: She is alert and oriented to person, place, and time.     ED Results / Procedures / Treatments   Labs (all labs ordered are listed, but only abnormal results are displayed) Labs Reviewed  LIPASE, BLOOD  COMPREHENSIVE METABOLIC PANEL  CBC  URINALYSIS, ROUTINE W REFLEX MICROSCOPIC    EKG EKG Interpretation  Date/Time:  Monday Mar 29 2020 00:36:07 EDT Ventricular Rate:  122 PR Interval:  130 QRS Duration: 52 QT Interval:  302 QTC Calculation: 430 R Axis:   52 Text Interpretation: Sinus tachycardia Nonspecific ST and T wave abnormality Abnormal ECG Lateral T wave inversions have resolved compared to previous Confirmed by Rochele Raring [phone]) on 03/29/2020 3:18:16 AM   Radiology No results found.  Procedures Procedures (including critical care time)  Medications Ordered in ED Medications  sodium chloride flush (NS) 0.9 % injection 3 mL (has no administration in time range)    ED Course  I have reviewed the triage vital signs and the nursing notes.  Pertinent labs & imaging results that were available during my care of the patient were reviewed by me and considered in my medical decision making (see chart for details).    MDM Rules/Calculators/A&P                      Patient to ED with AP, constipation.   Chart reviewed: she was seen at [facility] ED on 5/1 and felt to have lab and CT evidence of UTI without sepsis, and ?diverticulitis. She was given Rocephin and Flagyl in the ED and ultimately discharged home on Augmentin. She was also given a regimen to treat constipation (Miralax and  Colace). She presented to [facility] on 5/5 with c/o Abdominal pain, nausea,  vomiting and diarrhea. CT scans at that time were negative (including chest); cultures were negative (blood and urine); stool studies showed +norovirus and nontoxogenic C. Diff. She was discharged on 5/8 and reports she had ongoing abdominal pain at the time of discharge. N, V and diarrhea had resolved.   4:25 - I spoke with son, Eustace Moore, who reports that she has been doing well since discharge from the hospital until today (03/28/20) when the family make crab legs for a mother's day meal. He reports she started having pain about one hour after eating. No vomiting. He reports ongoing issues of constipation.   Labs pending. Abdominal series ordered, however, patient found to have become hypotensive. Blood pressure measured manually and is verified to be in the 55'H systolic. No change in mentation. Fluid bolus ordered. X-ray to be done when there is improvement in the blood pressure.   Patient care signed out to Alyse Low, PA-C, pending imaging and re-evaluation of the patient's pain and blood pressure. Suspect ongoing constipation and anticipate discharge home.    Final Clinical Impression(s) / ED Diagnoses Final diagnoses:  None   1. Abdominal pain 2. History of constipation  Rx / DC Orders ED Discharge Orders    None       Charlann Lange, Hershal Coria 03/29/20 [id number]    Ward, Delice Bison, DO 03/29/20 [phone]

## 2020-03-30 LAB — CULTURE, BLOOD (ROUTINE X 2)
Culture: NO GROWTH
Culture: NO GROWTH
Special Requests: ADEQUATE

## 2020-03-30 IMAGING — MR MR MRA NECK WO/W CM
8 series · 44 of 48 positions shown · IV contrast (Gadavist)
Comparison: Noncontrast head CT earlier today. [REDACTED] [HOSPITAL] [HOSPITAL] Neck MRA 11/12/2018

CLINICAL DATA: 63-year-old female code stroke presentation with
left side weakness and slurred speech. Right MCA infarct last month.

EXAM:
MRA NECK WITHOUT AND WITH CONTRAST
TECHNIQUE: Multiplanar and multiecho pulse sequences of the neck were obtained
without and with intravenous contrast. Angiographic images of the
neck were obtained using MRA technique without and with intravenous
contrast.
CONTRAST:  4.5mL GADAVIST GADOBUTROL 1 MMOL/ML IV SOLN

[Series 6: tof_fl3d_tra_iso · axial · 0.6mm · 0.52mm/px · z∈[-222,-151]mm · 7 of 133 slices shown]
[im 1/133]
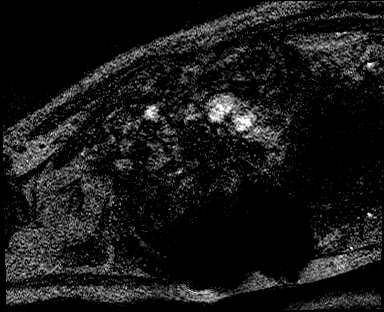
[im 23/133]
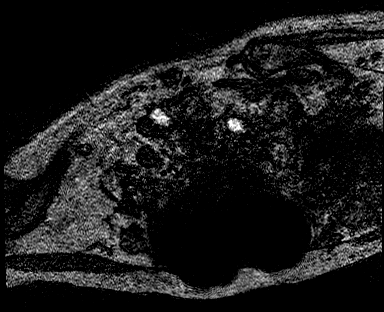
[im 45/133]
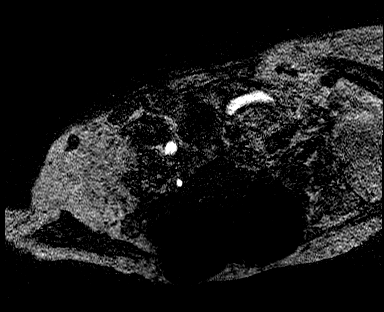
[im 67/133]
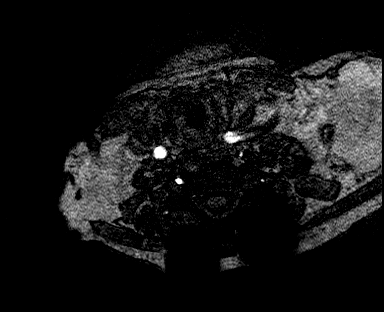
[im 89/133]
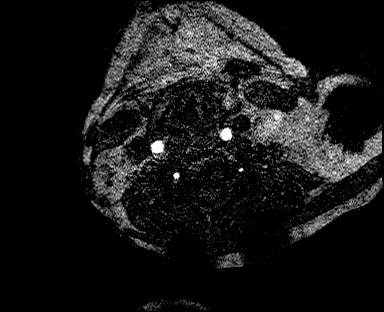
[im 111/133]
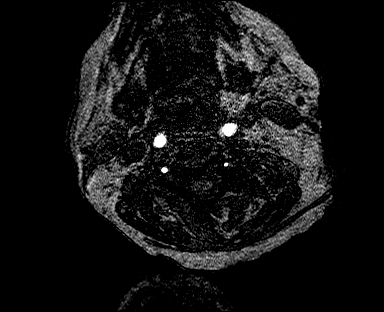
[im 133/133]
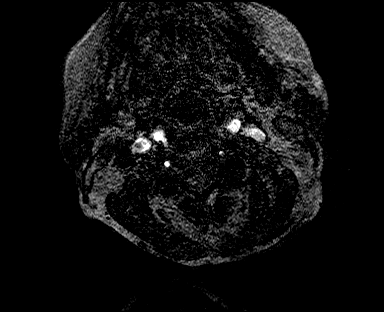

[Series 9: angio_fl3d_cor_pre_ttc=3.0s · coronal · 0.9mm · 0.85mm/px · 5 of 96 slices shown]
[im 1/96]
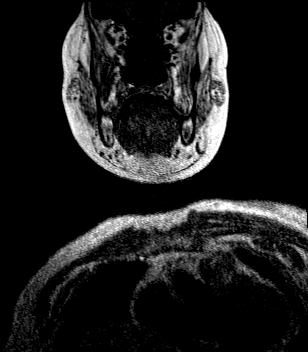
[im 24/96]
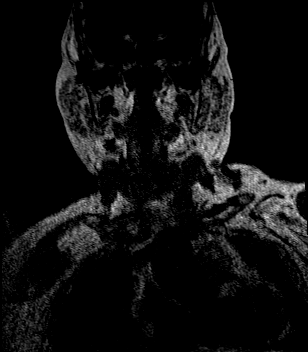
[im 48/96]
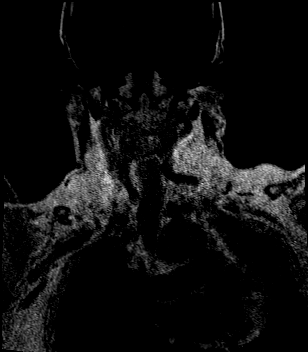
[im 72/96]
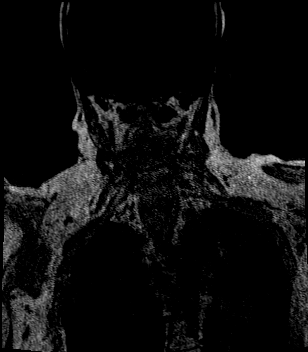
[im 96/96]
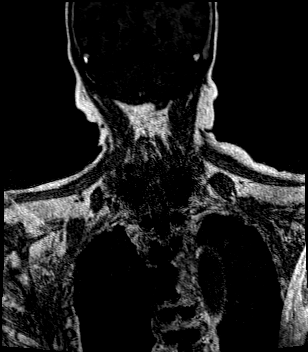

[Series 11: angio_fl3d_cor_post_ttc=3.0s · coronal · 0.9mm · 0.85mm/px · 6 of 96 slices shown (1 of 2)]
[im 1/96]
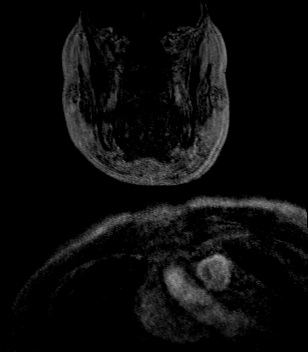
[im 20/96]
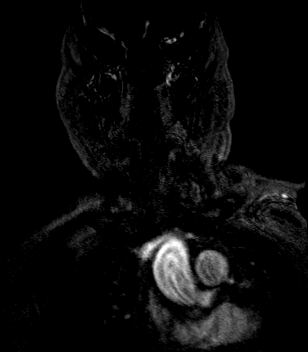
[im 39/96]
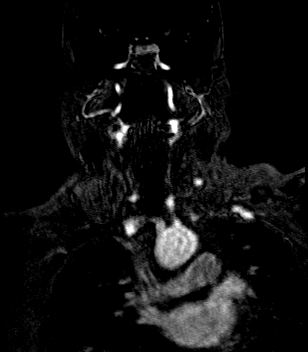
[im 58/96]
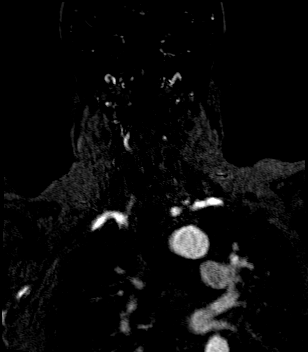
[im 77/96]
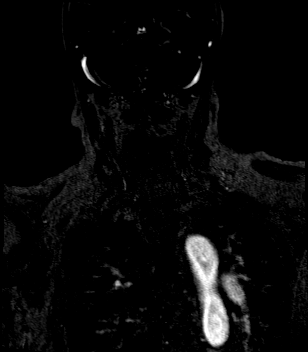
[im 96/96]
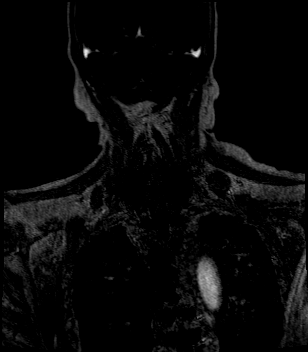

[Series 12: angio_fl3d_cor_post_ttc=3.0s_moco-adv · coronal · 0.9mm · 0.85mm/px · 6 of 96 slices shown (1 of 2)]
[im 1/96]
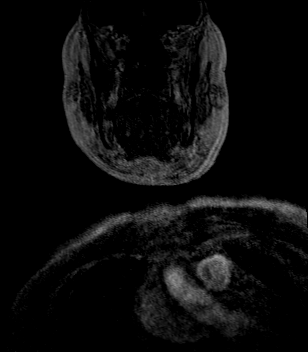
[im 20/96]
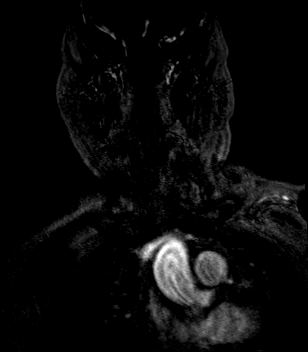
[im 39/96]
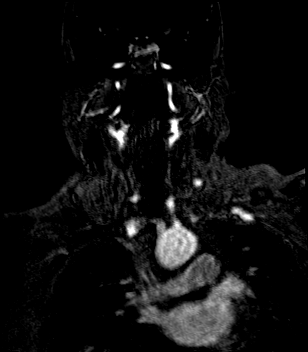
[im 58/96]
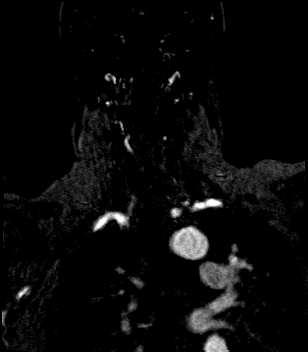
[im 77/96]
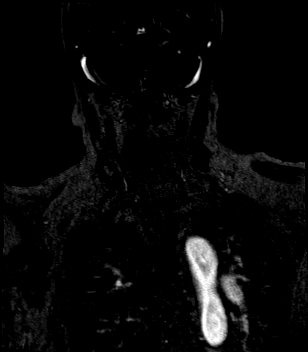
[im 96/96]
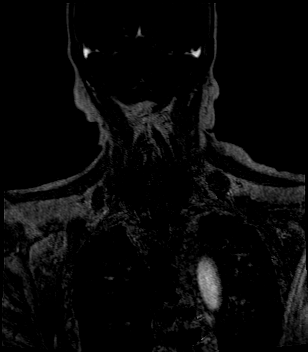

[Series 13: angio_fl3d_cor_post_ttc=3.0s_moco-adv_sub · coronal · 0.9mm · 0.85mm/px · 6 of 96 slices shown (1 of 2)]
[im 1/96]
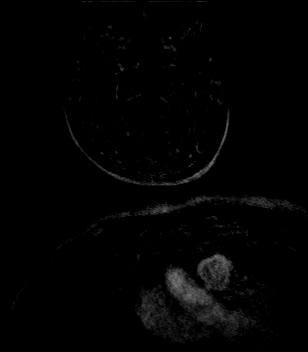
[im 20/96]
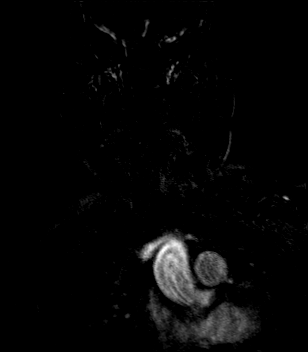
[im 39/96]
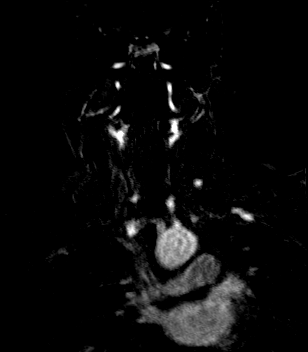
[im 58/96]
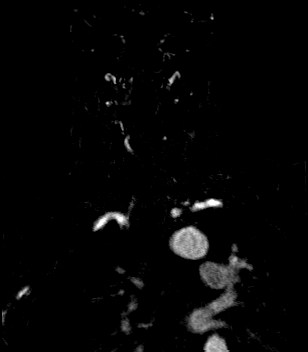
[im 77/96]
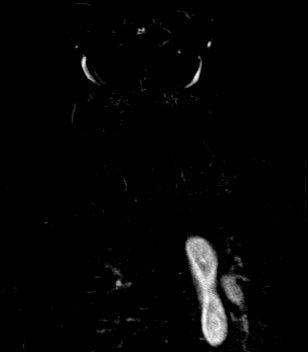
[im 96/96]
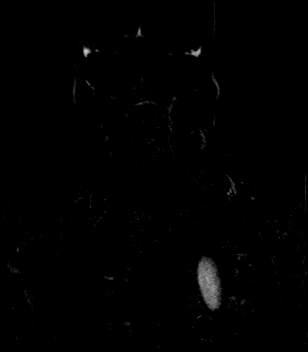

[Series 15: angio_fl3d_cor_post_ttc=3.0s · coronal · 0.9mm · 0.85mm/px · 6 of 96 slices shown (2 of 2)]
[im 1/96]
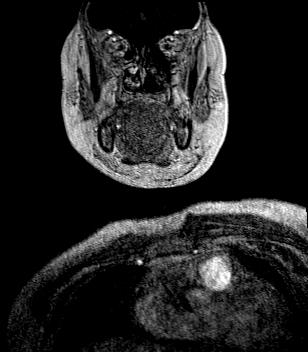
[im 20/96]
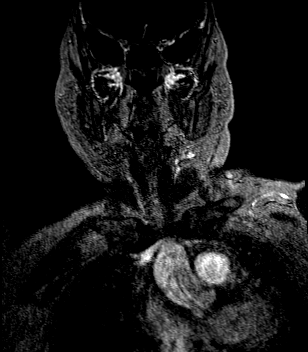
[im 39/96]
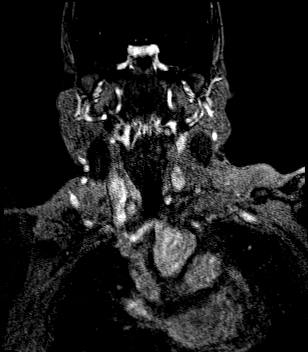
[im 58/96]
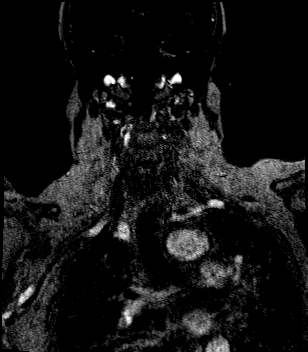
[im 77/96]
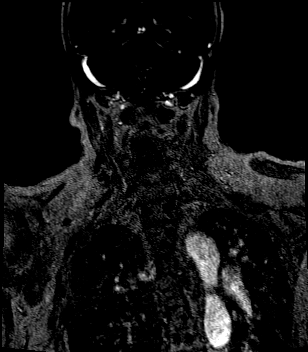
[im 96/96]
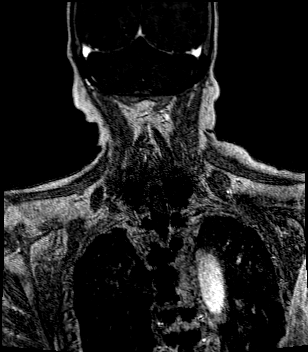

[Series 16: angio_fl3d_cor_post_ttc=3.0s_moco-adv · coronal · 0.9mm · 0.85mm/px · 6 of 96 slices shown (2 of 2)]
[im 1/96]
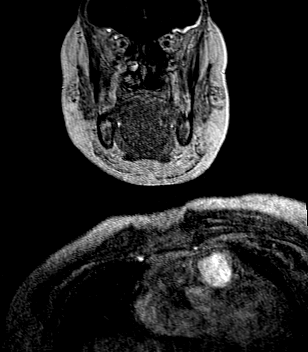
[im 20/96]
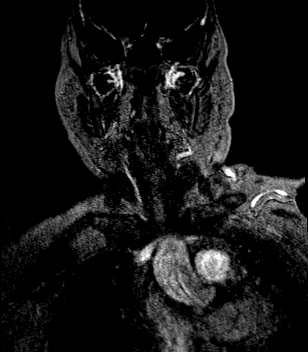
[im 39/96]
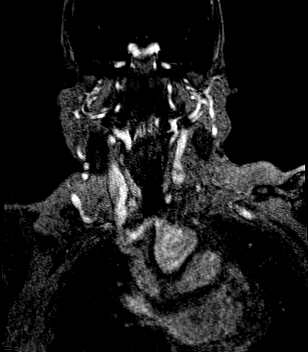
[im 58/96]
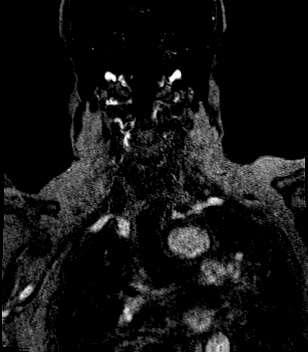
[im 77/96]
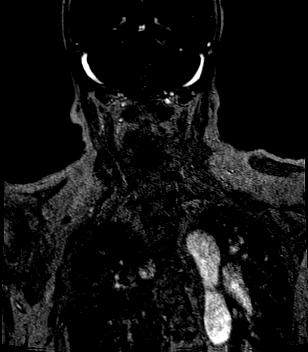
[im 96/96]
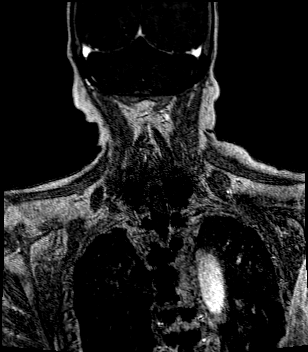

[Series 17: angio_fl3d_cor_post_ttc=3.0s_moco-adv_sub · coronal · 0.9mm · 0.85mm/px · 2 of 96 slices shown (2 of 2)]
[im 1/96]
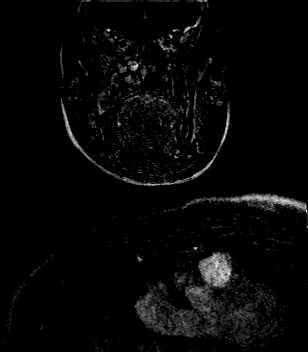
[im 20/96]
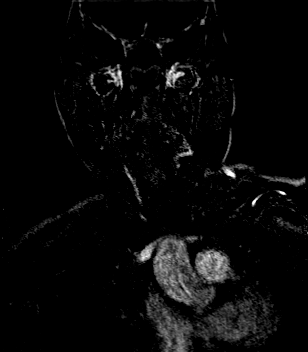

[44 of 48 positions shown; findings below may reference images not displayed]

FINDINGS: Precontrast time-of-flight neck MRA reveals antegrade flow in both
cervical carotid and vertebral arteries. Tortuous great vessels and
proximal left CCA.

Post-contrast neck MRA images reveal a 3 vessel arch configuration.
Tortuous proximal great vessels.

Tortuous right CCA without stenosis. Patent right carotid
bifurcation. Tortuous right ICA distal to the bulb, no stenosis.

Tortuous left CCA without stenosis. Patent left carotid bifurcation
with evidence of chronic anterior plaque but no cervical left ICA
stenosis.

Limited detail of both vertebral artery origins. The right vertebral
artery appears dominant. Both vertebral arteries are patent to the
skull base and appear stable since 1447.
IMPRESSION: 1. Tortuous cervical carotid arteries with mild atherosclerosis and
no significant stenosis.
2. Limited evaluation of the cervical vertebral arteries: The right
is dominant, and both are patent to the skull base but stenosis at
the vertebral artery origins cannot be excluded.

## 2020-03-30 IMAGING — MR MR MRA HEAD W/O CM
11 of 14 series · 32 of 48 positions shown · non-contrast
Comparison: Neck MRA and brain MRI today reported separately.
Intracranial MRA 12/25/2019.

CLINICAL DATA: 63-year-old female code stroke presentation with
left side weakness and slurred speech. Right MCA infarct last month.

EXAM:
MRA HEAD WITHOUT CONTRAST
TECHNIQUE: Angiographic images of the Circle of Willis were obtained using MRA
technique without intravenous contrast.

[Series 5: DWI · axial · 3.0mm · 0.88mm/px · z∈[-111,+16]mm · 5 of 88 slices shown (1 of 4)]
[im 1/88]
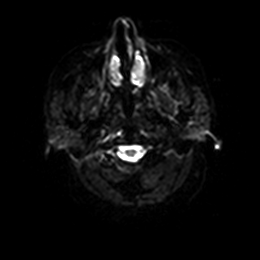
[im 22/88]
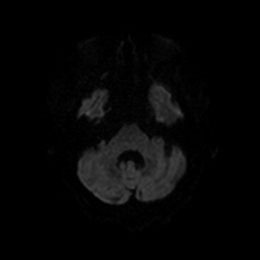
[im 44/88]
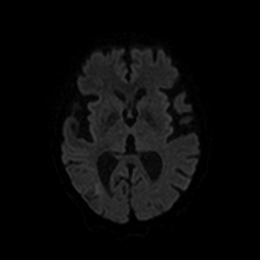
[im 66/88]
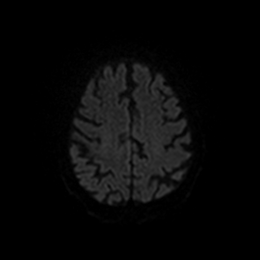
[im 88/88]
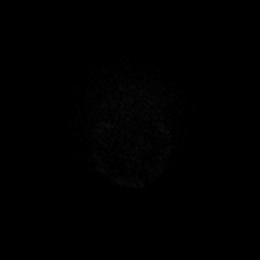

[Series 6: DWI · axial · 3.0mm · 0.88mm/px · z∈[-111,+16]mm · 2 of 44 slices shown (2 of 4)]
[im 1/44]
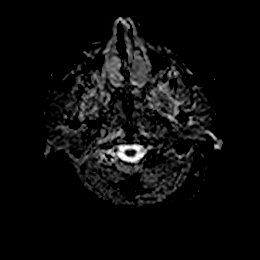
[im 44/44]
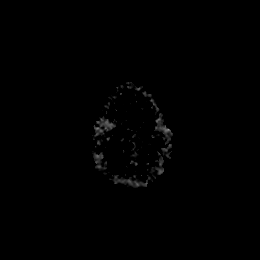

[Series 7: DWI · coronal · 4.0mm · 0.88mm/px · 4 of 68 slices shown (3 of 4)]
[im 1/68]
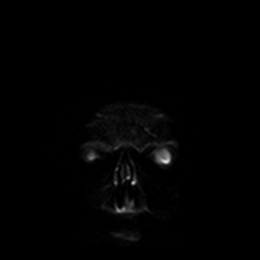
[im 23/68]
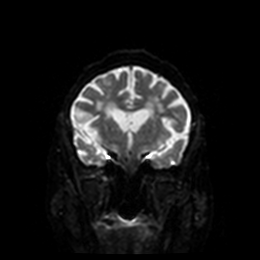
[im 45/68]
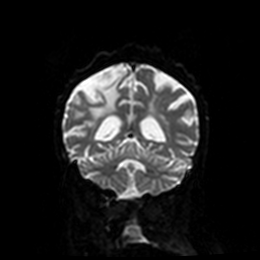
[im 68/68]
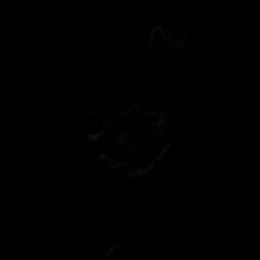

[Series 8: DWI · coronal · 4.0mm · 0.88mm/px · 2 of 34 slices shown (4 of 4)]
[im 1/34]
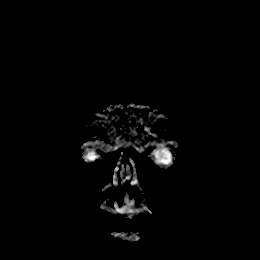
[im 34/34]
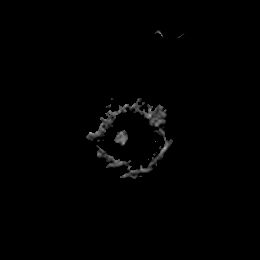

[Series 9: T1 · sagittal · 5.0mm · 0.75mm/px · 2 of 23 slices shown]
[im 1/23]
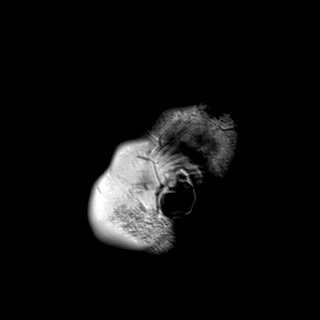
[im 23/23]
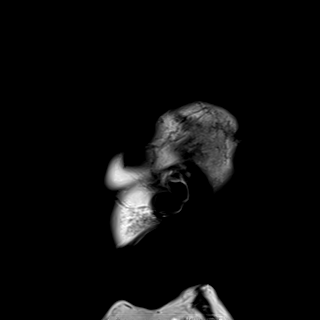

[Series 10: T2 · axial · 5.0mm · 0.72mm/px · z∈[-117,+13]mm · 2 of 23 slices shown (1 of 2)]
[im 1/23]
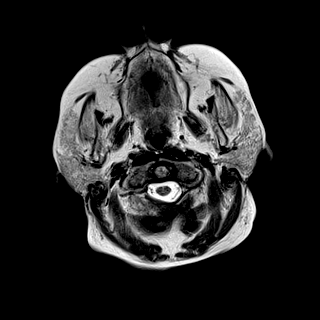
[im 23/23]
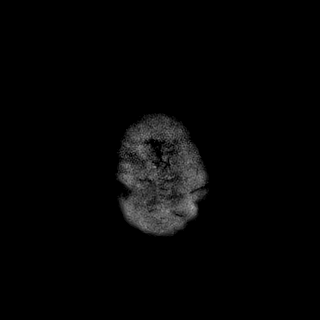

[Series 11: FLAIR · axial · 5.0mm · 0.45mm/px · z∈[-117,+13]mm · 2 of 23 slices shown]
[im 1/23]
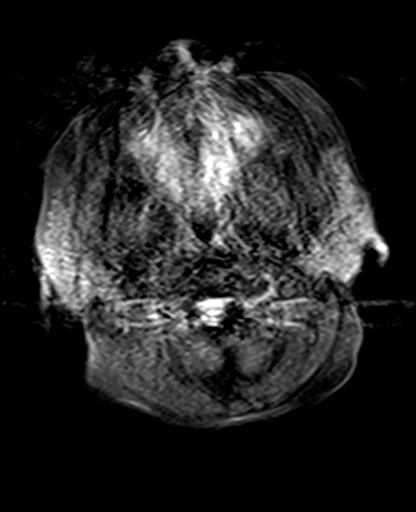
[im 23/23]
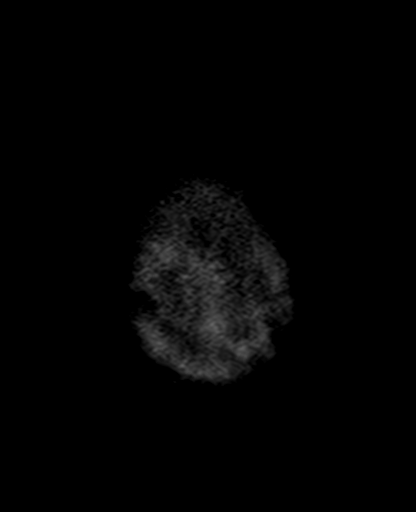

[Series 12: mag_images · axial · 3.0mm · 0.90mm/px · z∈[-127,+23]mm · 4 of 52 slices shown]
[im 1/52]
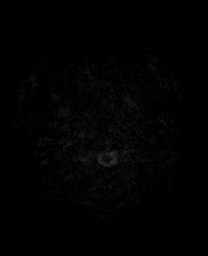
[im 18/52]
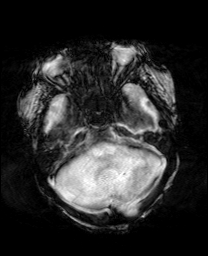
[im 35/52]
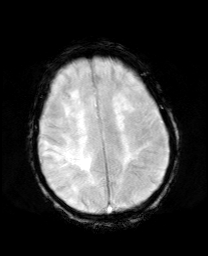
[im 52/52]
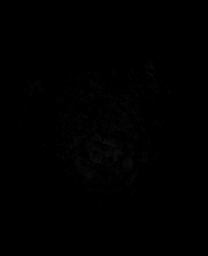

[Series 13: pha_images · axial · 3.0mm · 0.90mm/px · z∈[-127,+20]mm · 3 of 51 slices shown]
[im 1/51]
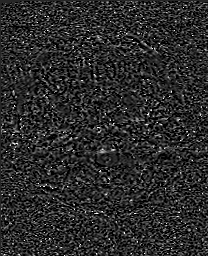
[im 26/51]
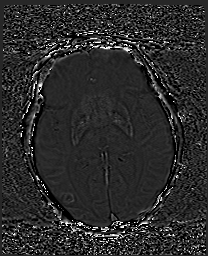
[im 51/51]
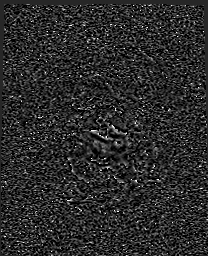

[Series 14: swi_images · axial · 3.0mm · 0.90mm/px · z∈[-127,+23]mm · 4 of 52 slices shown]
[im 1/52]
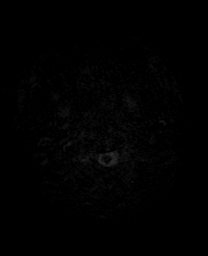
[im 18/52]
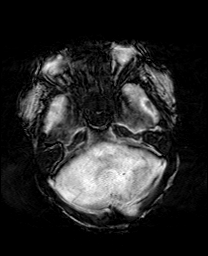
[im 35/52]
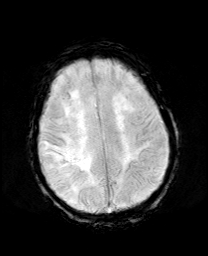
[im 52/52]
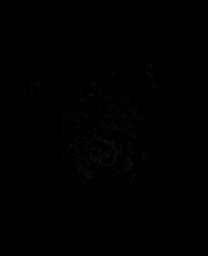

[Series 17: T2 · coronal · 5.0mm · 0.34mm/px · 2 of 29 slices shown (2 of 2)]
[im 1/29]
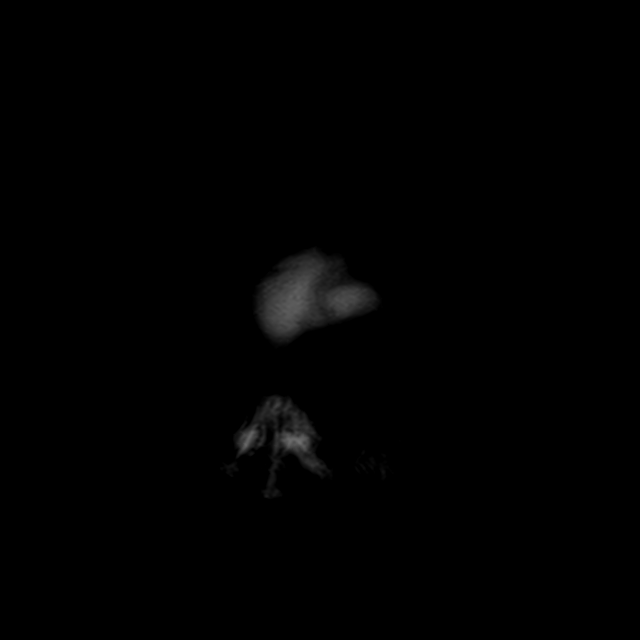
[im 29/29]
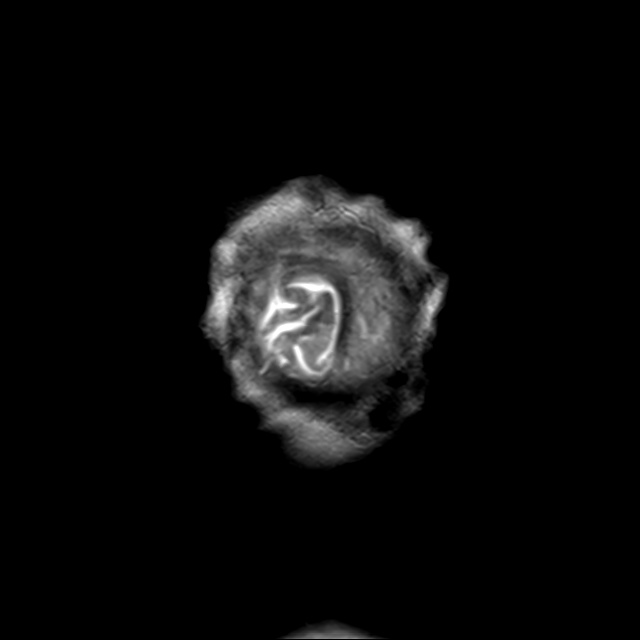

[32 of 48 positions shown; findings below may reference images not displayed]

FINDINGS: Diminutive posterior circulation with stable antegrade flow in the
distal right vertebral artery and basilar. The left V4 segment
appears to be severely stenotic, unchanged. Dominant left AICA
suspected. Patent right PICA origin. Fetal type bilateral PCA
origins. SCA origins remain patent. Bilateral PCA branches are
stable without significant stenosis.

Stable antegrade flow in both ICA siphons. Normal posterior
communicating artery origins. Patent carotid termini. Normal MCA and
ACA origins. No siphon stenosis. Stable bilateral MCA and ACA
branches with no hemodynamically significant stenosis.
IMPRESSION: 1. Stable intracranial MRA since last month:
2. Patent anterior circulation with no hemodynamically significant
stenosis.
3. Diminutive posterior circulation due to fetal type bilateral PCA
origins, but probable chronic high-grade stenosis of the distal left
vertebral artery.

## 2020-03-30 IMAGING — MR MR HEAD W/O CM
11 of 12 series · 43 of 48 positions shown · non-contrast
Comparison: Brain MRI 12/25/2019. Neck MRA today.

CLINICAL DATA: 63-year-old female code stroke presentation with
left side weakness and slurred speech. Right MCA infarct last month.

EXAM:
MRI HEAD WITHOUT CONTRAST
TECHNIQUE: Multiplanar, multiecho pulse sequences of the brain and surrounding
structures were obtained without intravenous contrast.

[Series 5: DWI · axial · 3.0mm · 0.88mm/px · z∈[-111,+16]mm · 7 of 88 slices shown (1 of 4)]
[im 1/88]
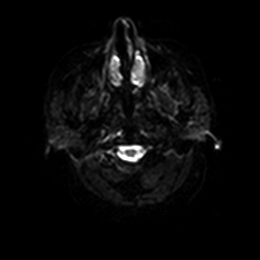
[im 15/88]
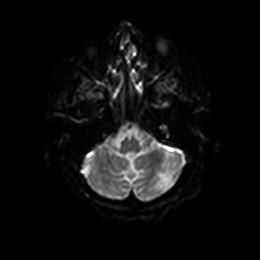
[im 30/88]
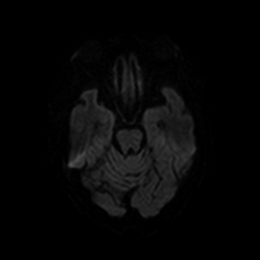
[im 44/88]
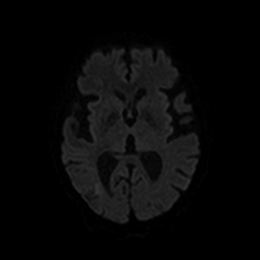
[im 59/88]
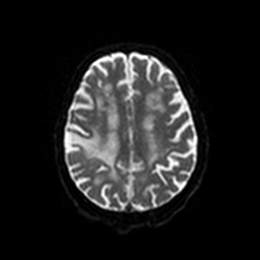
[im 73/88]
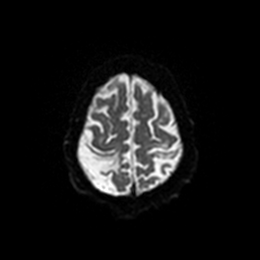
[im 88/88]
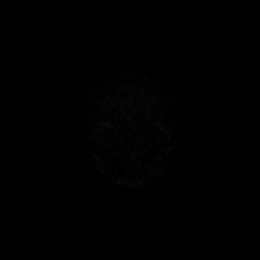

[Series 6: DWI · axial · 3.0mm · 0.88mm/px · z∈[-111,+16]mm · 3 of 44 slices shown (2 of 4)]
[im 1/44]
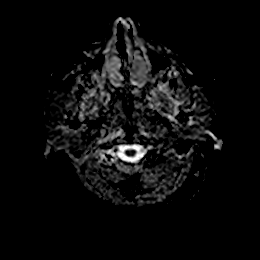
[im 22/44]
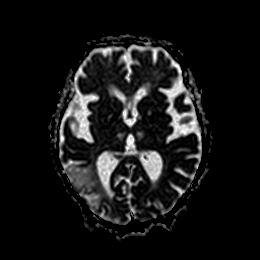
[im 44/44]
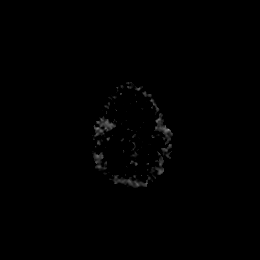

[Series 7: DWI · coronal · 4.0mm · 0.88mm/px · 6 of 68 slices shown (3 of 4)]
[im 1/68]
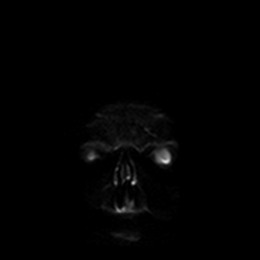
[im 14/68]
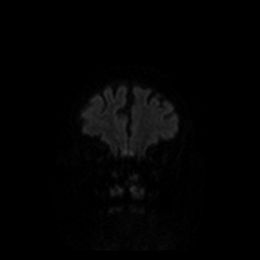
[im 27/68]
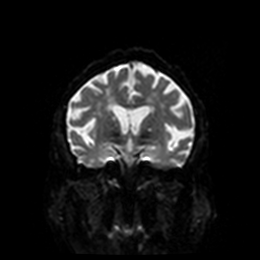
[im 41/68]
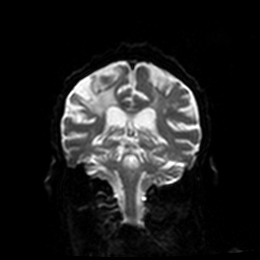
[im 54/68]
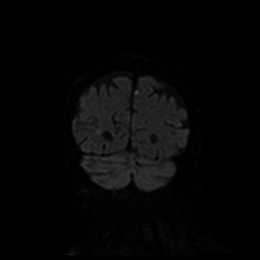
[im 68/68]
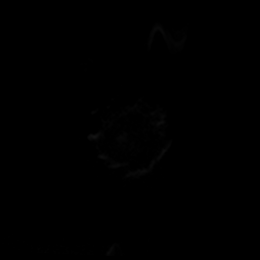

[Series 8: DWI · coronal · 4.0mm · 0.88mm/px · 3 of 34 slices shown (4 of 4)]
[im 1/34]
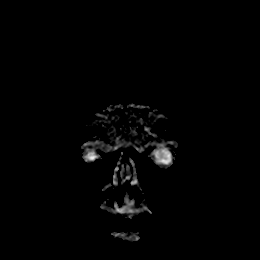
[im 17/34]
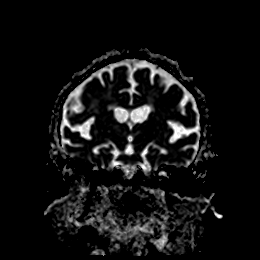
[im 34/34]
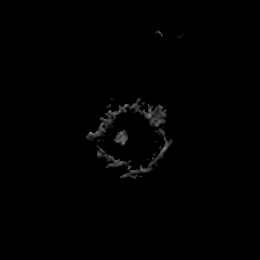

[Series 9: T1 · sagittal · 5.0mm · 0.75mm/px · 2 of 23 slices shown]
[im 1/23]
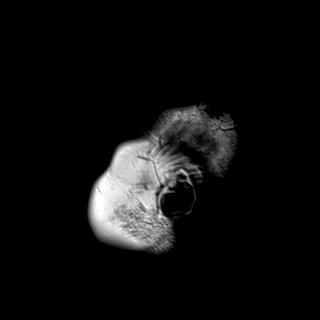
[im 23/23]
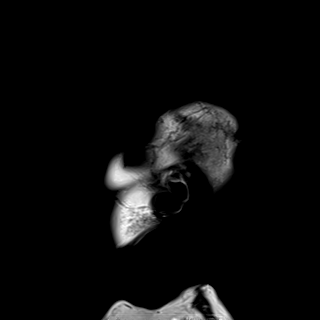

[Series 10: T2 · axial · 5.0mm · 0.72mm/px · z∈[-117,+13]mm · 2 of 23 slices shown (1 of 2)]
[im 1/23]
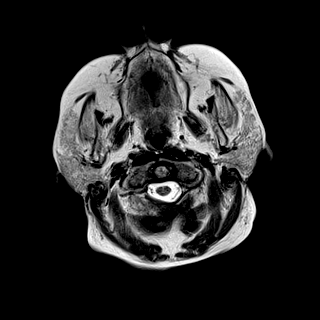
[im 23/23]
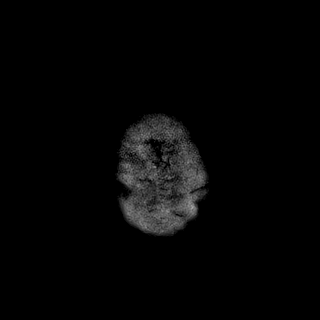

[Series 11: FLAIR · axial · 5.0mm · 0.45mm/px · z∈[-117,+13]mm · 2 of 23 slices shown]
[im 1/23]
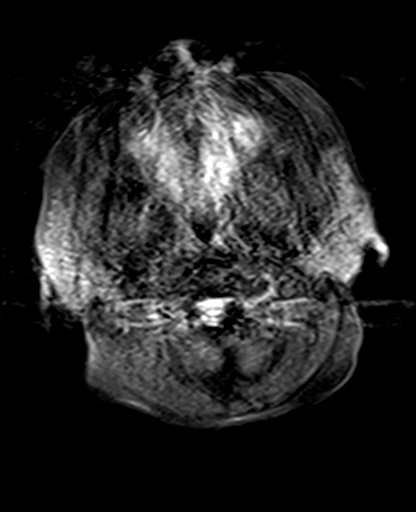
[im 23/23]
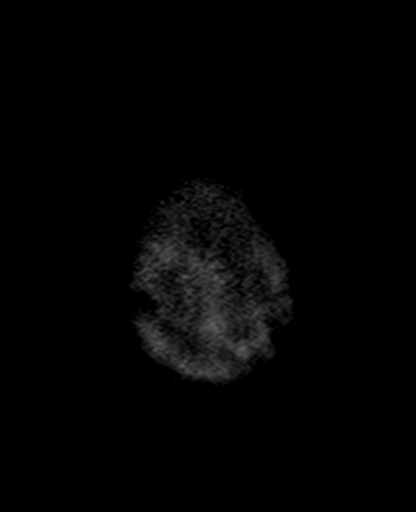

[Series 12: mag_images · axial · 3.0mm · 0.90mm/px · z∈[-127,+23]mm · 5 of 52 slices shown]
[im 1/52]
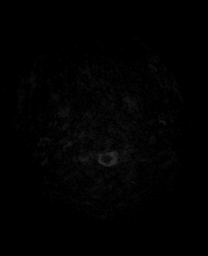
[im 13/52]
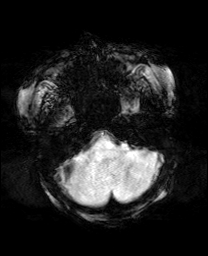
[im 26/52]
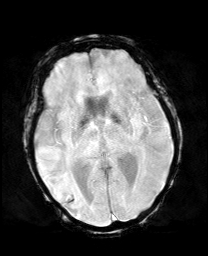
[im 39/52]
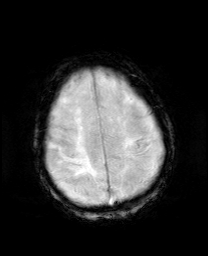
[im 52/52]
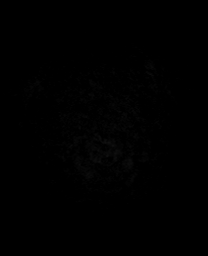

[Series 13: pha_images · axial · 3.0mm · 0.90mm/px · z∈[-127,+20]mm · 5 of 51 slices shown]
[im 1/51]
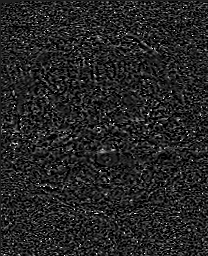
[im 13/51]
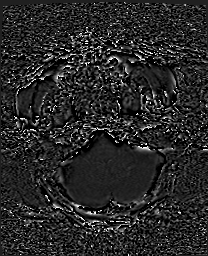
[im 26/51]
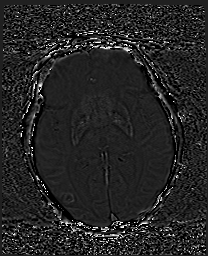
[im 38/51]
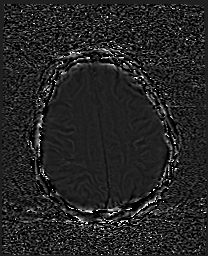
[im 51/51]
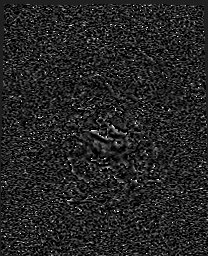

[Series 14: swi_images · axial · 3.0mm · 0.90mm/px · z∈[-127,+23]mm · 5 of 52 slices shown]
[im 1/52]
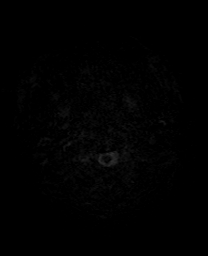
[im 13/52]
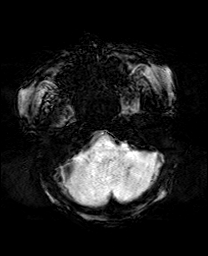
[im 26/52]
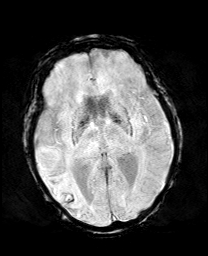
[im 39/52]
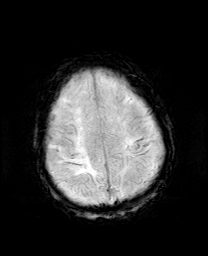
[im 52/52]
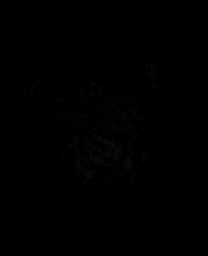

[Series 17: T2 · coronal · 5.0mm · 0.34mm/px · 3 of 29 slices shown (2 of 2)]
[im 1/29]
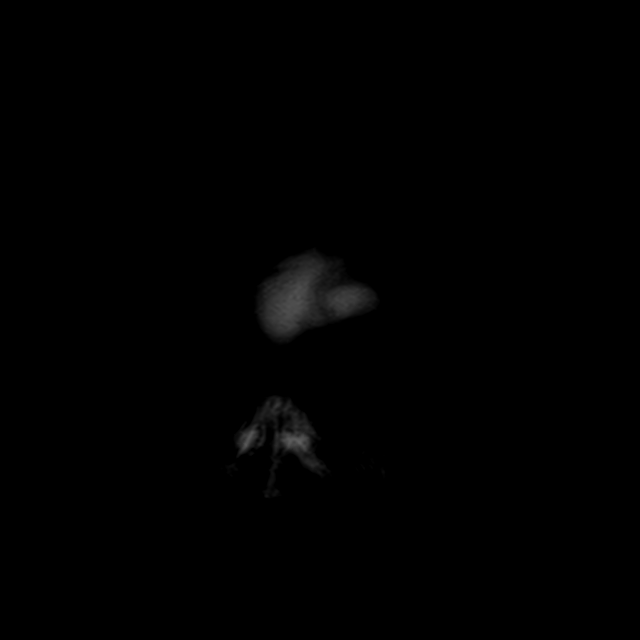
[im 15/29]
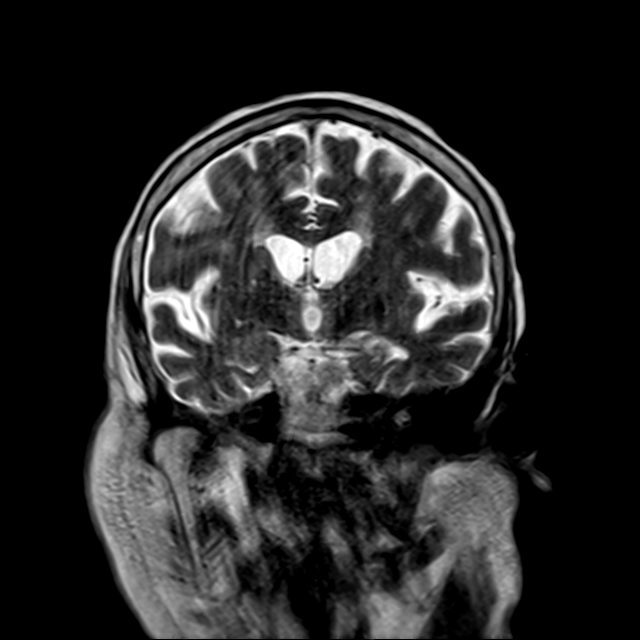
[im 29/29]
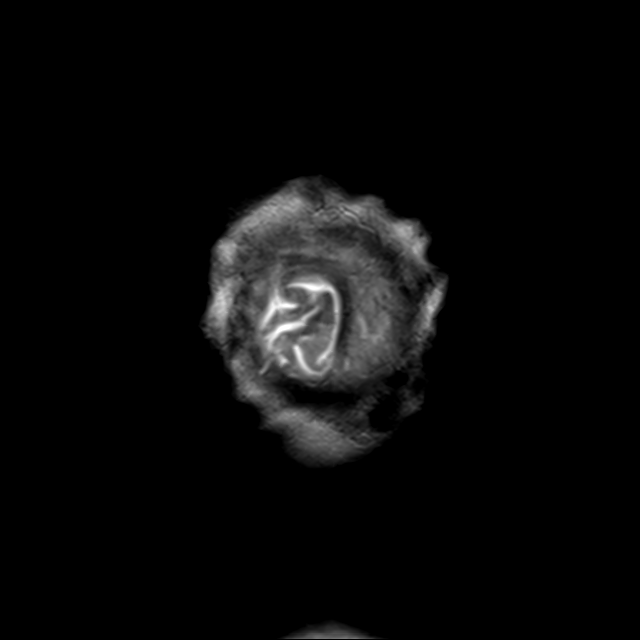

[43 of 48 positions shown; findings below may reference images not displayed]

FINDINGS: Brain: There are 2 or 3 punctate foci of restricted diffusion in the
posterior and medial left parietal lobe (series 5, image 78). No
other convincing restricted diffusion.

Previous bilateral MCA territory infarcts, more extensive on the
right. Chronic right PCA territory infarct. Chronic and developing
encephalomalacia in both hemispheres. Patchy chronic bilateral
cerebellar infarcts. Chronic lacunar infarcts in the bilateral deep
gray nuclei, especially both medial thalami. Confluent superimposed
bilateral cerebral white matter T2 and FLAIR hyperintensity.

Scattered hemosiderin, mostly associated with the prior cortical
infarcts.

No midline shift, mass effect, evidence of mass lesion,
ventriculomegaly, extra-axial collection or acute intracranial
hemorrhage. Cervicomedullary junction and pituitary are within
normal limits.

Vascular: Major intracranial vascular flow voids are stable since
[REDACTED].

Skull and upper cervical spine: Widespread cervical spine
degeneration with evidence of mild spinal stenosis. Normal bone
marrow signal.

Sinuses/Orbits: Stable, negative.

Other: Mastoids remain clear. Visible internal auditory structures
appear normal.
IMPRESSION: 1. Several punctate acute infarcts in the left parietal lobe. No
associated hemorrhage or mass effect.
2. Otherwise stable underlying very advanced chronic ischemic
disease, including the posterior right MCA territory infarct from
last month.

## 2020-03-31 IMAGING — MR MR MRA PELVIS W/ OR W/O CM
10 series · 43 of 48 positions shown · IV contrast (gadolinium)
Comparison: CT scan of the abdomen and pelvis 12/29/2018

CLINICAL DATA: 63-year-old female with a history of cerebrovascular
accident due to embolism of a vertebral artery. Evaluate for
potential embolic source. Apparently, the patient's IV infiltrated
during attempted gadolinium injection. The patient chose not to
undergo new IV placement in so the examination was performed without
intravenous gadolinium contrast.

EXAM:
MRA ABDOMEN AND PELVIS WITHOUT CONTRAST
TECHNIQUE: Multiplanar, multiecho pulse sequences of the abdomen and pelvis
were obtained with intravenous contrast. Angiographic images of
abdomen and pelvis were obtained using MRA technique without
intravenous contrast.
CONTRAST:  None

[Series 2: cor haste · coronal · 6.0mm · 1.19mm/px · 4 of 27 slices shown]
[im 1/27]
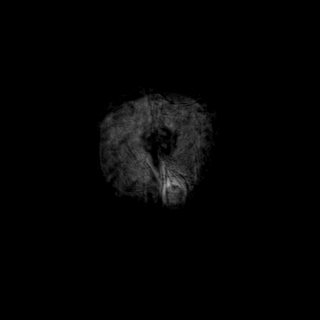
[im 9/27]
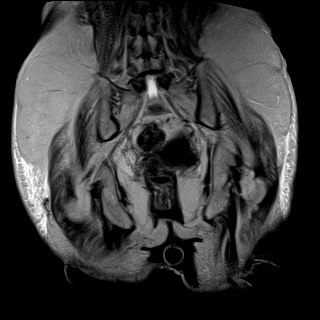
[im 18/27]
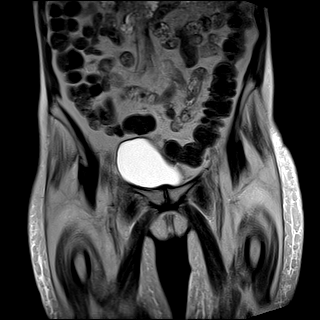
[im 27/27]
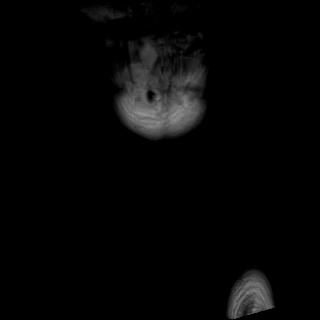

[Series 4: bSSFP · coronal · 6.0mm · 0.74mm/px · 4 of 27 slices shown (1 of 2)]
[im 1/27]
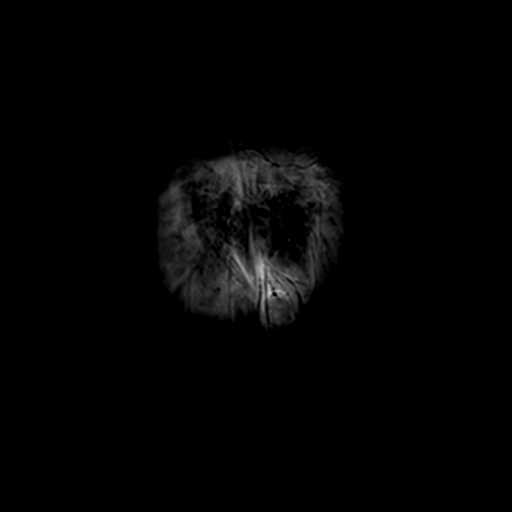
[im 9/27]
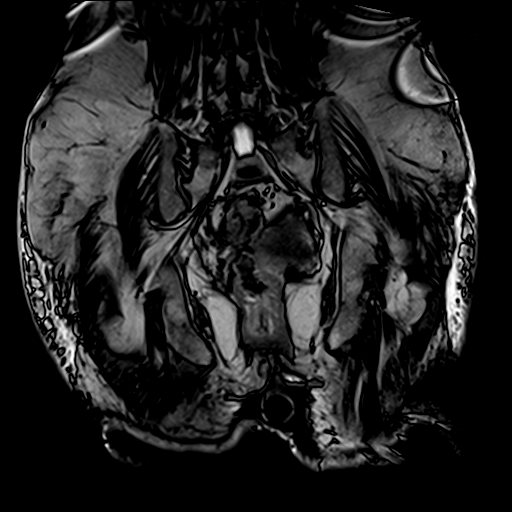
[im 18/27]
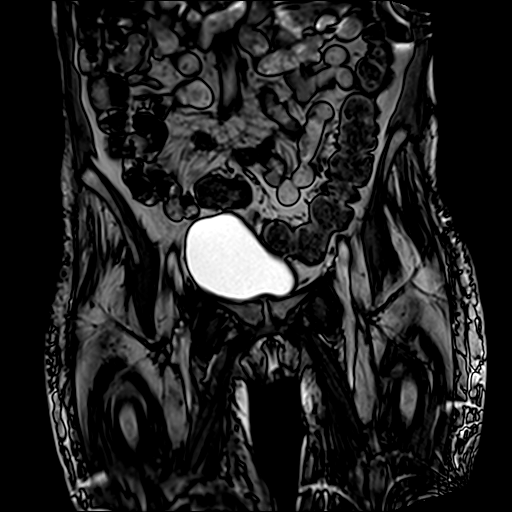
[im 27/27]
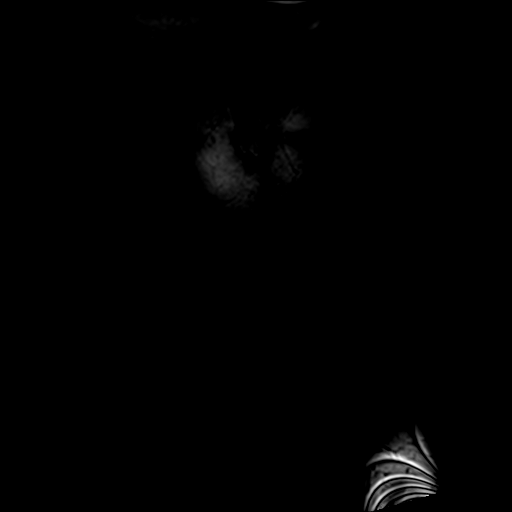

[Series 5: ax haste · axial · 6.0mm · 1.19mm/px · z∈[-127,+81]mm · 4 of 30 slices shown]
[im 1/30]
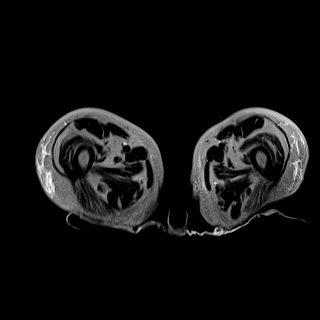
[im 10/30]
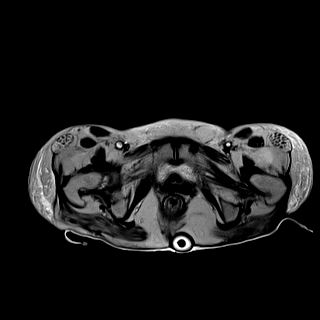
[im 20/30]
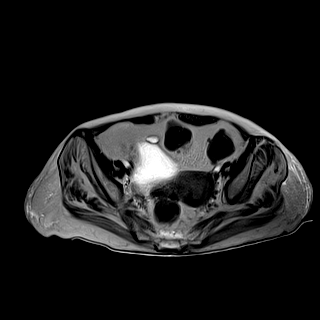
[im 30/30]
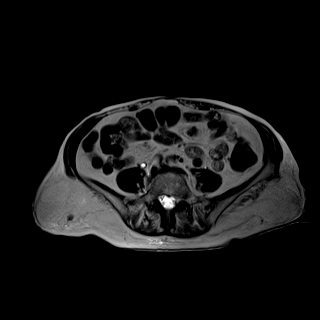

[Series 6: bSSFP · axial · 6.0mm · 0.74mm/px · z∈[-127,+81]mm · 3 of 30 slices shown (2 of 2)]
[im 1/30]
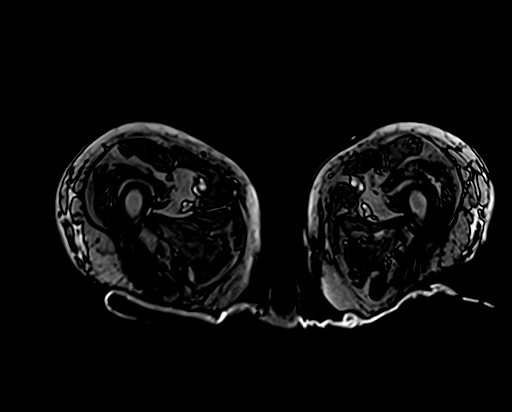
[im 15/30]
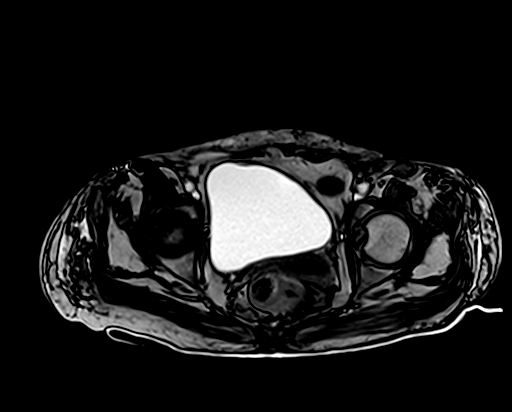
[im 30/30]
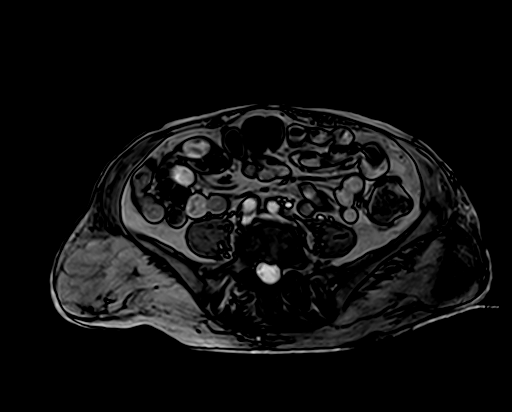

[Series 7: ax haste fs · axial · 6.0mm · 1.19mm/px · z∈[-127,+81]mm · 3 of 30 slices shown]
[im 1/30]
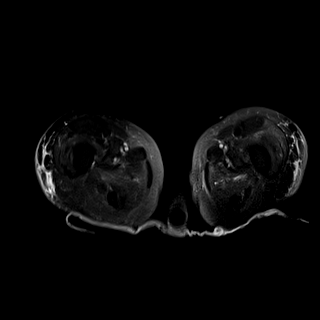
[im 15/30]
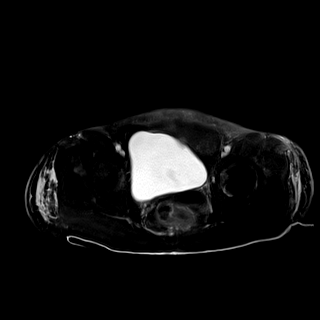
[im 30/30]
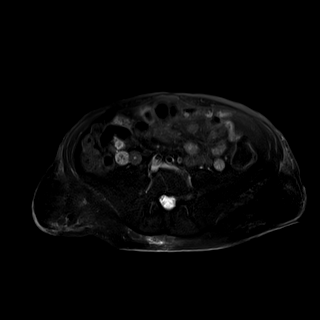

[Series 8: T1 · axial · 6.0mm · 1.48mm/px · z∈[-127,+81]mm · 3 of 30 slices shown]
[im 1/30]
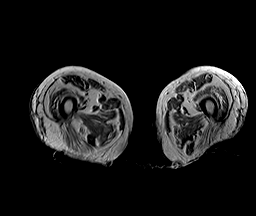
[im 15/30]
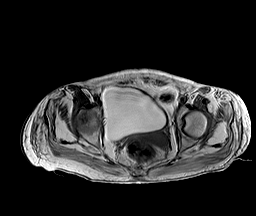
[im 30/30]
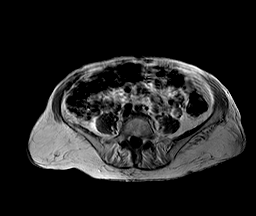

[Series 9: T1 dynamic · axial · 3.0mm · 1.41mm/px · z∈[-143,+93]mm · 8 of 80 slices shown]
[im 1/80]
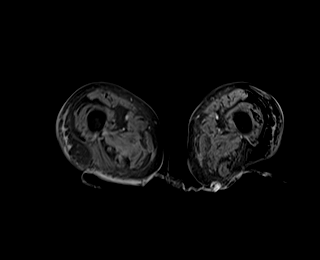
[im 10/80]
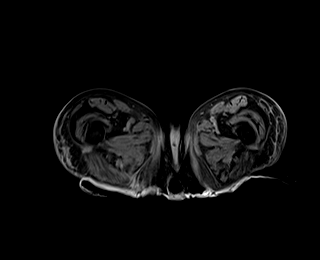
[im 20/80]
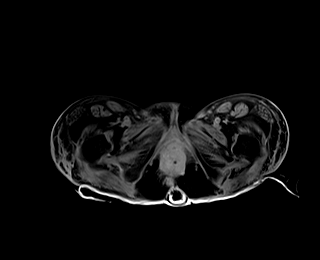
[im 30/80]
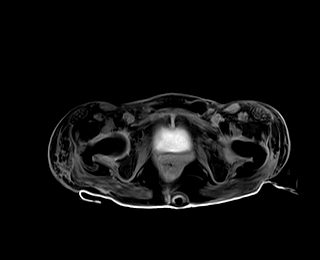
[im 50/80]
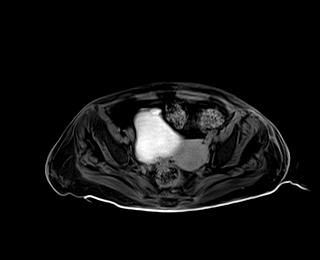
[im 60/80]
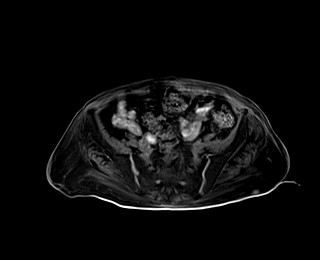
[im 70/80]
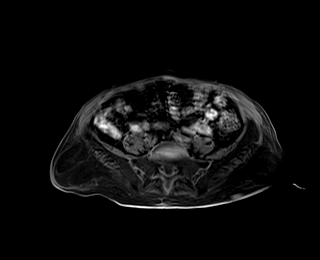
[im 80/80]
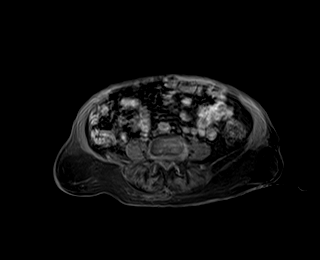

[Series 10: tof_fl2d_tra_ecg · axial · 3.5mm · 1.09mm/px · z∈[-156,+99]mm · 8 of 105 slices shown]
[im 1/105]
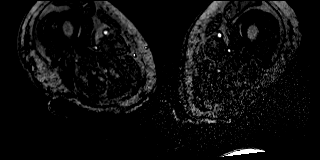
[im 19/105]
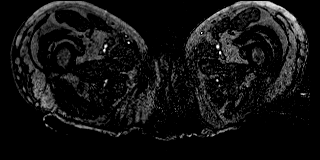
[im 29/105]
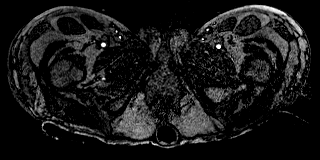
[im 48/105]
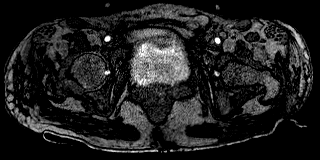
[im 57/105]
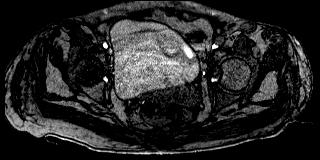
[im 76/105]
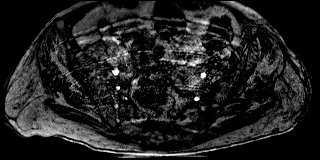
[im 86/105]
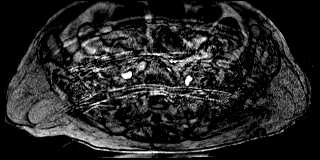
[im 105/105]
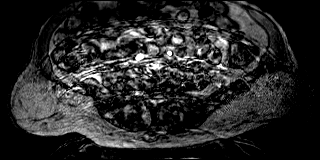

[Series 11: cor haste fs · sagittal · 6.0mm · 1.25mm/px · 3 of 27 slices shown (1 of 2)]
[im 1/27]
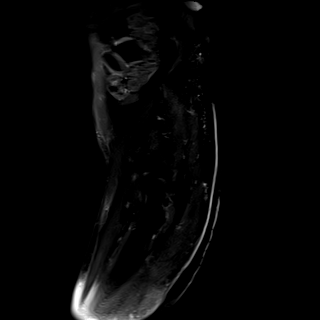
[im 14/27]
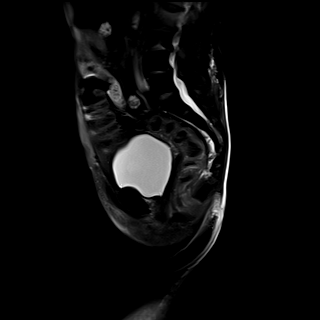
[im 27/27]
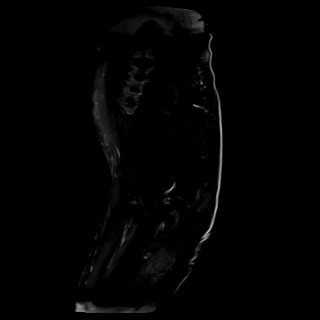

[Series 12: cor haste fs · coronal · 6.0mm · 1.25mm/px · 3 of 27 slices shown (2 of 2)]
[im 1/27]
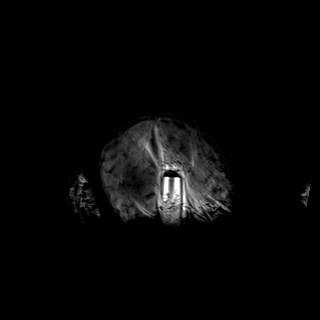
[im 14/27]
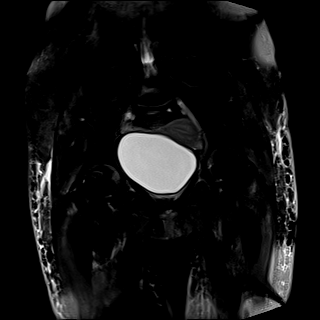
[im 27/27]
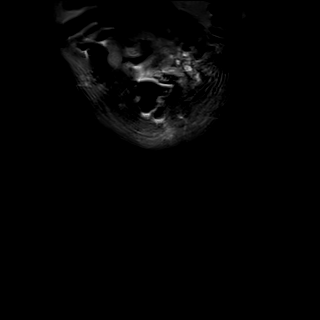

[43 of 48 positions shown; findings below may reference images not displayed]

FINDINGS: MRA ABDOMEN FINDINGS

Adequate signal within the iliac and proximal femoral veins. No
evidence of signal loss to suggest the presence of DVT. The arterial
structures are not well seen.

MRI PELVIS FINDINGS

There is a focal outpouching of the bladder at the dome consistent
with a small diverticulum measuring 1.4 x 1.0 cm. A second smaller
outpouching is also visible consistent with a second small
diverticulum. The visualized uterus and adnexa are unremarkable.
Colonic diverticular disease without CT evidence of active
inflammation.
IMPRESSION: 1. No evidence of deep venous thrombosis within the iliac or
visualized portions of the femoral veins.
2. Two small adjacent bladder diverticula noted at the bladder dome.
3. Colonic diverticular disease without CT evidence of active
inflammation.

## 2020-05-10 IMAGING — CT CT CHEST W/O CM
2 of 4 series · 13 of 36 positions shown, 16 images · non-contrast
Comparison: 03/21/2020

CLINICAL DATA: Chest discomfort, nausea and vomiting

EXAM:
CT CHEST, ABDOMEN AND PELVIS WITHOUT CONTRAST
TECHNIQUE: Multidetector CT imaging of the chest, abdomen and pelvis was
performed following the standard protocol without IV contrast.

[Series 2: cap w/o · axial · non-contrast · 0.61mm/px · z∈[+1177,+1617]mm · 10 of 106 slices shown, 13 images]
[im 9/106  mediastinal]
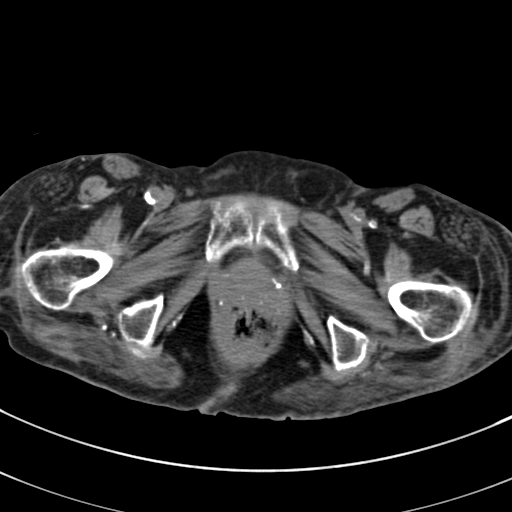
[im 9/106  lung]
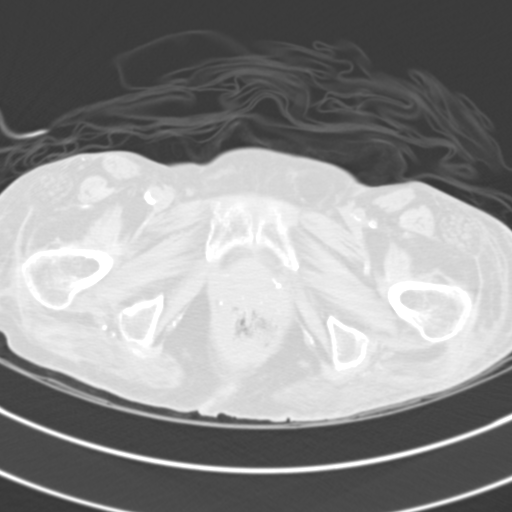
[im 18/106  lung]
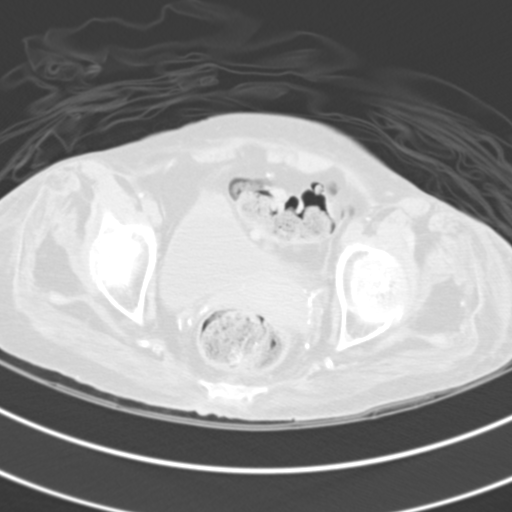
[im 27/106  lung]
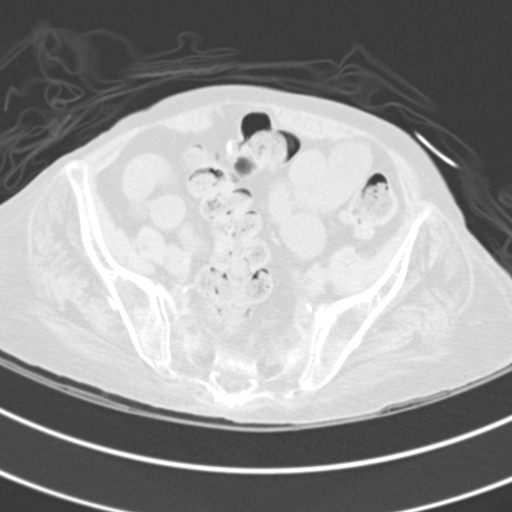
[im 36/106  lung]
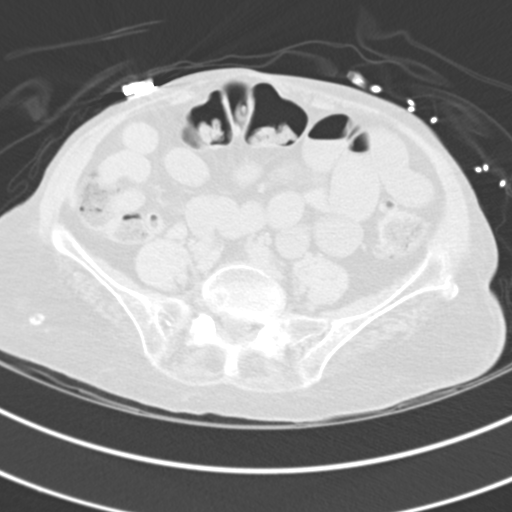
[im 44/106  mediastinal]
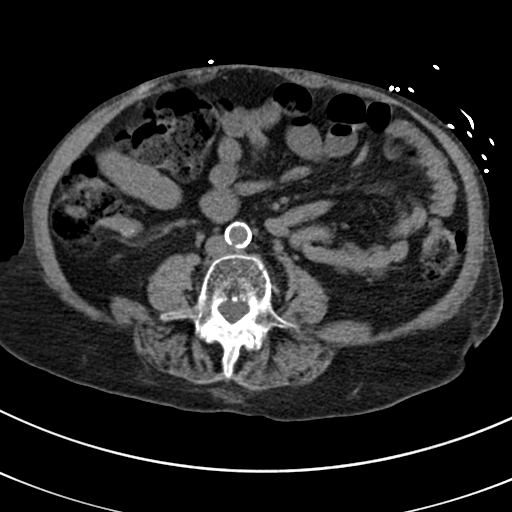
[im 44/106  lung]
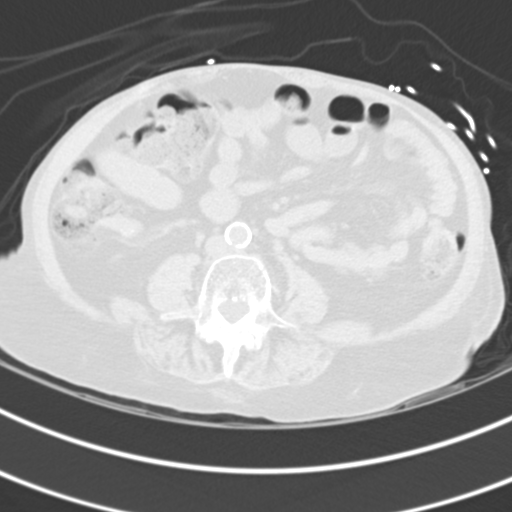
[im 62/106  lung]
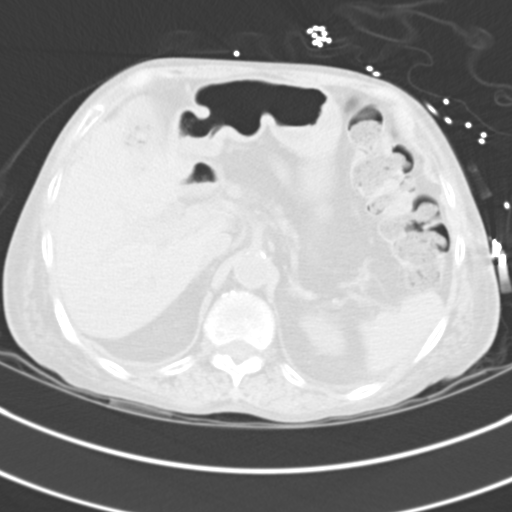
[im 71/106  lung]
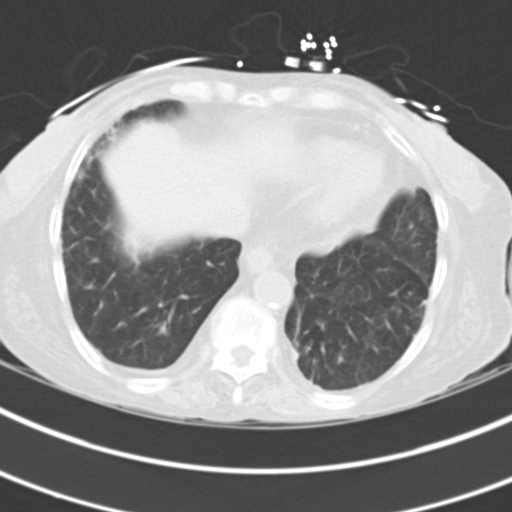
[im 79/106  lung]
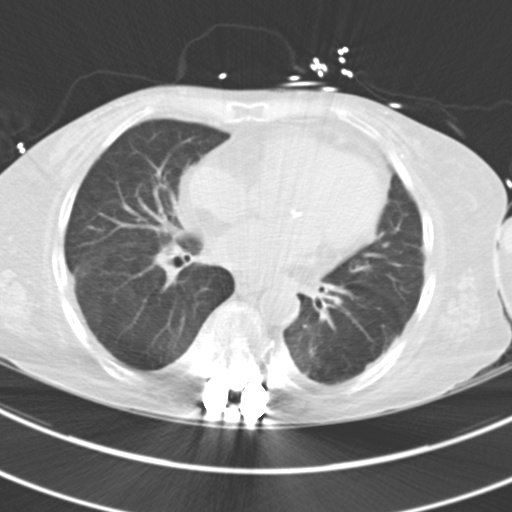
[im 88/106  mediastinal]
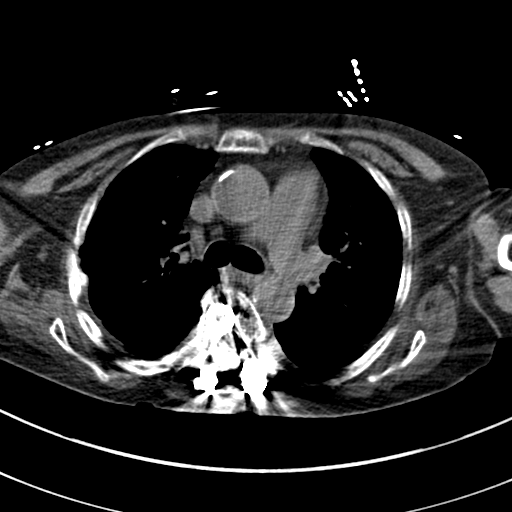
[im 88/106  lung]
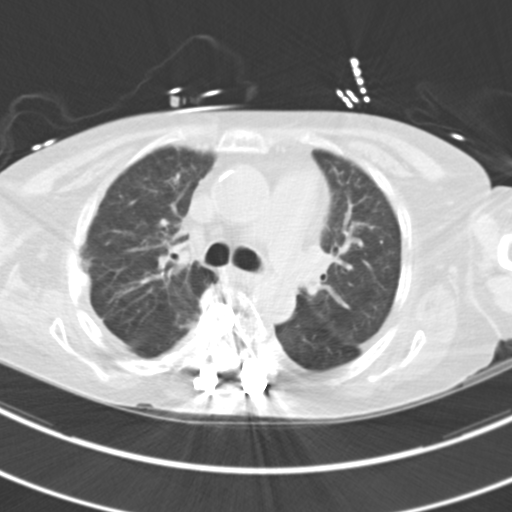
[im 97/106  lung]
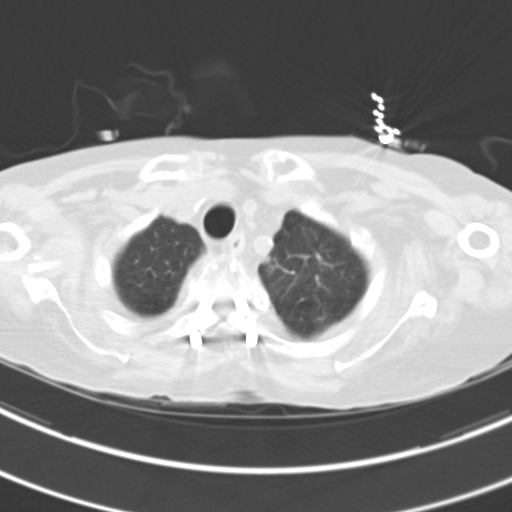

[Series 5: coronals · coronal · 0.65mm/px · 3 of 118 slices shown]
[im 24/118  lung]
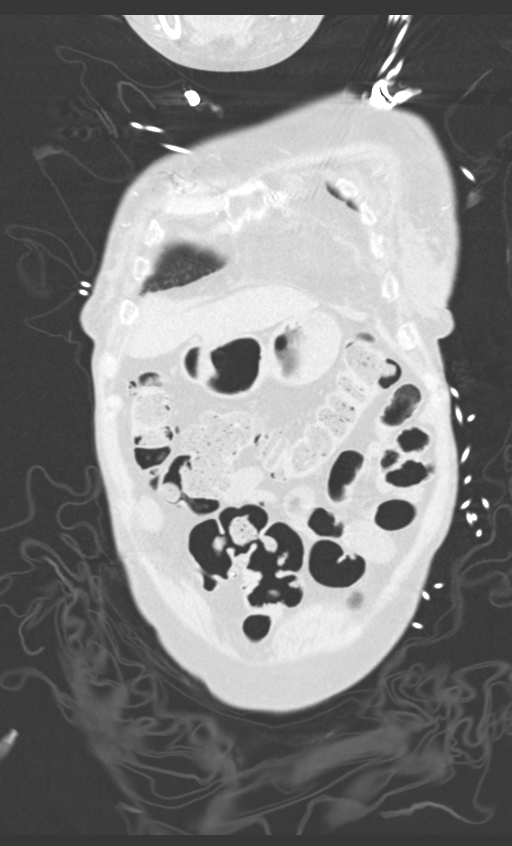
[im 47/118  lung]
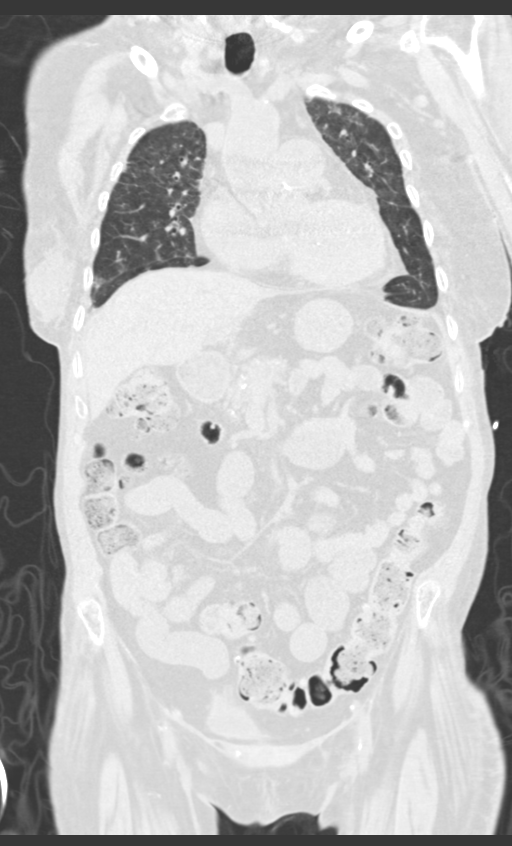
[im 71/118  lung]
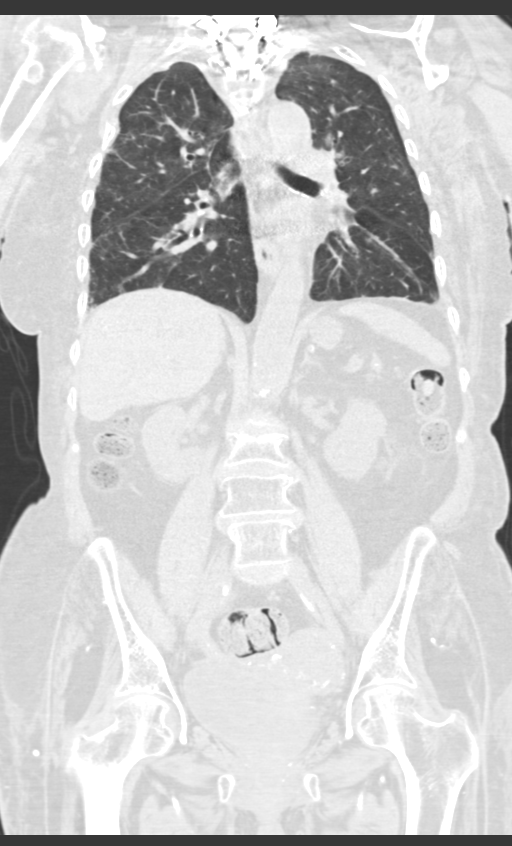

[13 of 36 positions shown; findings below may reference images not displayed]

FINDINGS: CT CHEST FINDINGS

Cardiovascular: Unenhanced imaging of the heart and great vessels
demonstrates no pericardial effusion. Normal caliber of the thoracic
aorta. Mild diffuse atherosclerosis.

Mediastinum/Nodes: No enlarged mediastinal, hilar, or axillary lymph
nodes. Thyroid gland, trachea, and esophagus demonstrate no
significant findings.

Lungs/Pleura: No acute airspace disease, effusion, or pneumothorax.
Central airways are patent.

Musculoskeletal: Multiple prior healed right-sided rib fractures. No
acute bony abnormalities. Extensive postsurgical changes from
thoracic fusion. Prior corpectomy at T4 and T5.

CT ABDOMEN PELVIS FINDINGS

Hepatobiliary: No focal liver abnormality is seen. Status post
cholecystectomy. No biliary dilatation.

Pancreas: Diffuse pancreatic atrophy. Calcifications consistent with
sequela of chronic calcific pancreatitis. No acute inflammatory
changes.

Spleen: Normal in size without focal abnormality.

Adrenals/Urinary Tract: No urinary tract calculi or obstructive
uropathy. The bladder is unremarkable. The adrenals are normal.

Stomach/Bowel: Scattered colonic diverticulosis without
diverticulitis. No bowel obstruction or ileus. Moderate stool
throughout the colon.

Vascular/Lymphatic: Aortic atherosclerosis. No enlarged abdominal or
pelvic lymph nodes.

Reproductive: Uterus and bilateral adnexa are unremarkable.

Other: Stable fat containing left inguinal hernia. No abdominal wall
hernia. No free fluid or free gas.

Musculoskeletal: No acute or destructive bony lesions. Chronic T12,
L1, and L3 compression deformities. Reconstructed images demonstrate
no additional findings.
IMPRESSION: 1. No acute intrathoracic, intra-abdominal, or intrapelvic process.
2. Sequelae of chronic calcific pancreatitis.
3. Stable fat containing left inguinal hernia.
4. Moderate fecal retention.
5. Aortic Atherosclerosis (NWFO6-HDB.B).

## 2020-05-10 IMAGING — CT CT ABD-PELV W/O CM
2 of 4 series · 13 of 36 positions shown, 16 images · non-contrast
Comparison: 03/21/2020

CLINICAL DATA: Chest discomfort, nausea and vomiting

EXAM:
CT CHEST, ABDOMEN AND PELVIS WITHOUT CONTRAST
TECHNIQUE: Multidetector CT imaging of the chest, abdomen and pelvis was
performed following the standard protocol without IV contrast.

[Series 2: cap w/o · axial · non-contrast · 0.61mm/px · z∈[+1177,+1617]mm · 10 of 106 slices shown, 13 images]
[im 9/106  mediastinal]
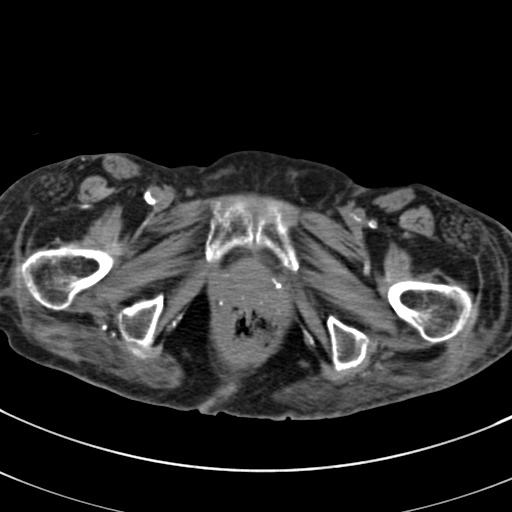
[im 9/106  lung]
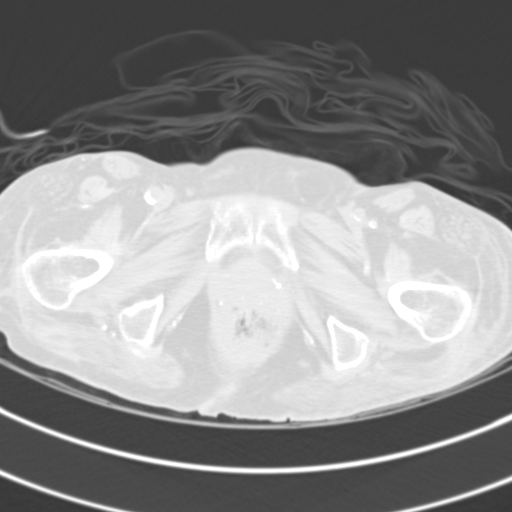
[im 18/106  lung]
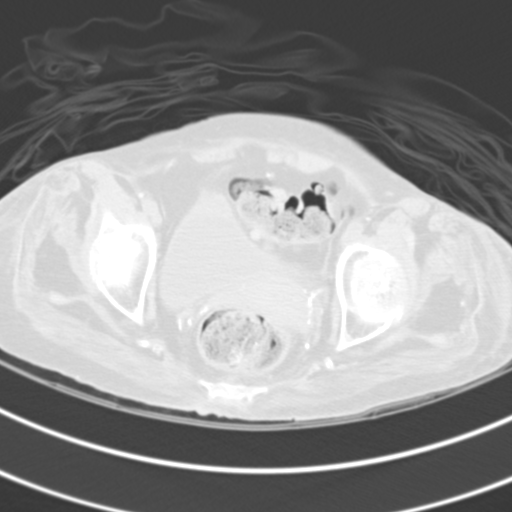
[im 27/106  lung]
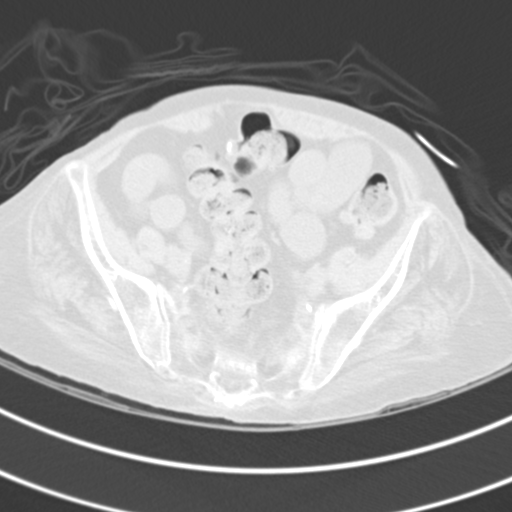
[im 36/106  lung]
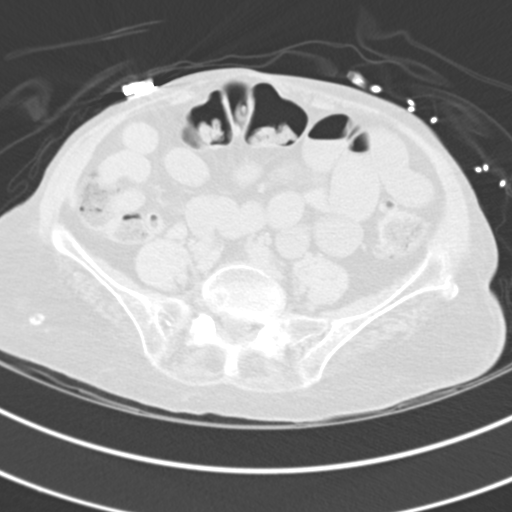
[im 44/106  mediastinal]
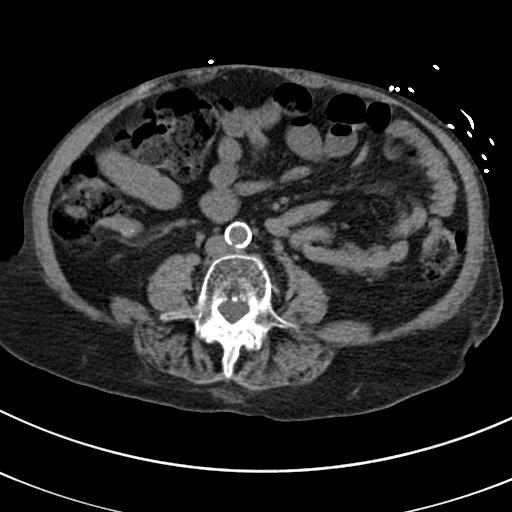
[im 44/106  lung]
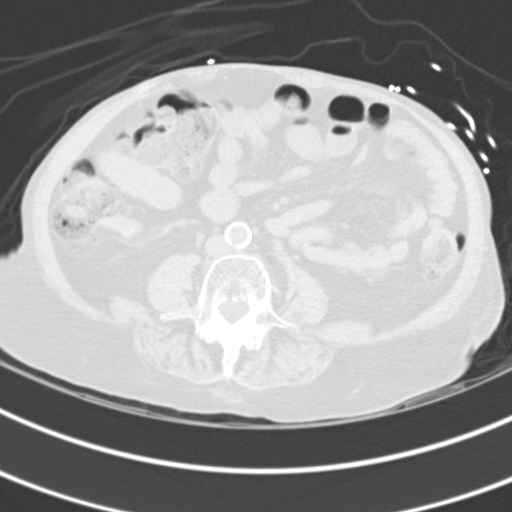
[im 62/106  lung]
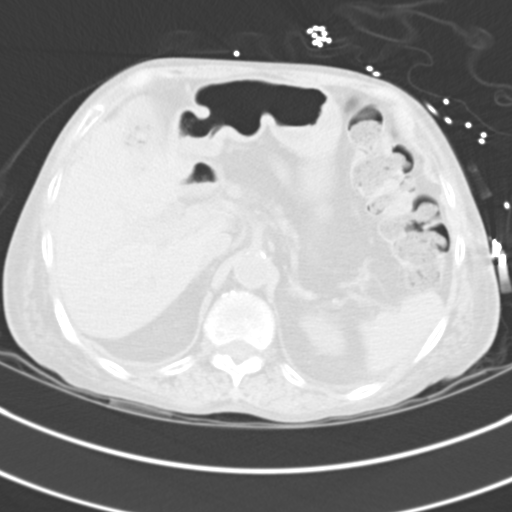
[im 71/106  lung]
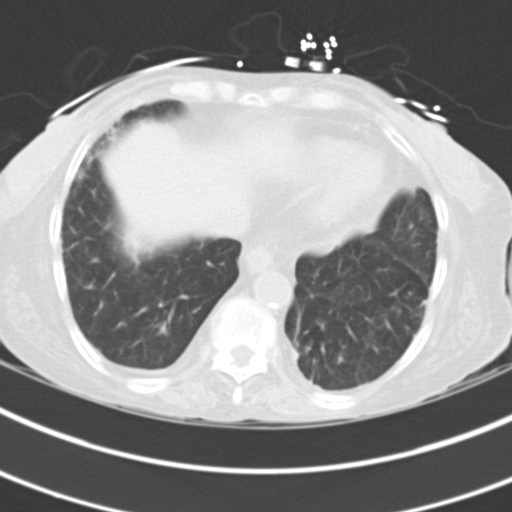
[im 79/106  lung]
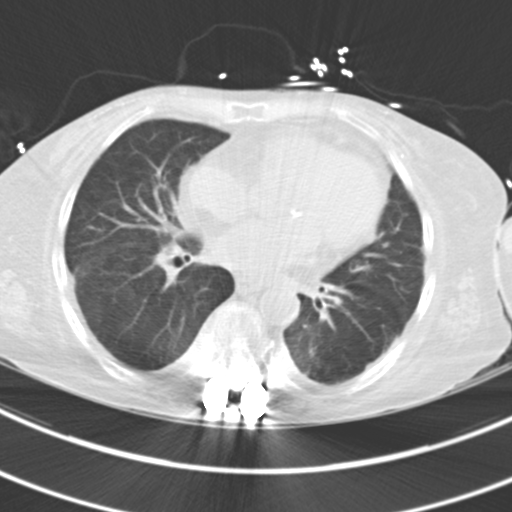
[im 88/106  mediastinal]
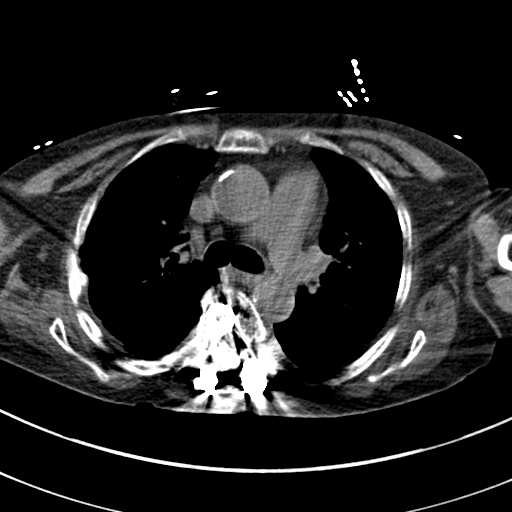
[im 88/106  lung]
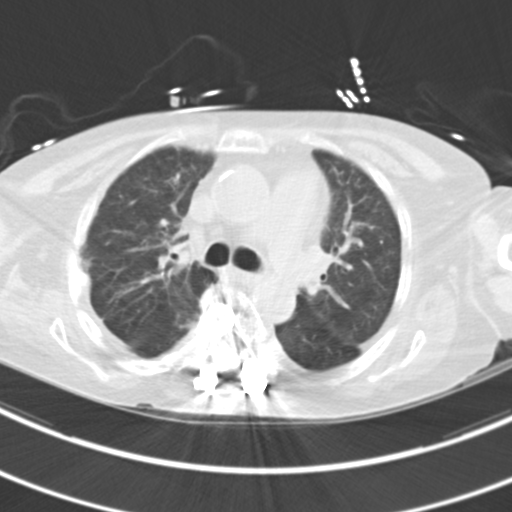
[im 97/106  lung]
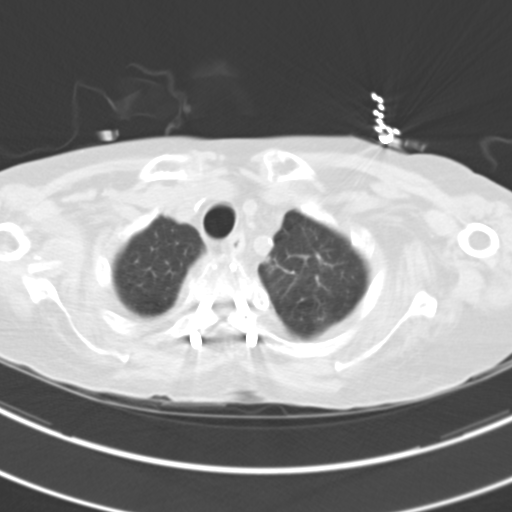

[Series 5: coronals · coronal · 0.65mm/px · 3 of 118 slices shown]
[im 24/118  lung]
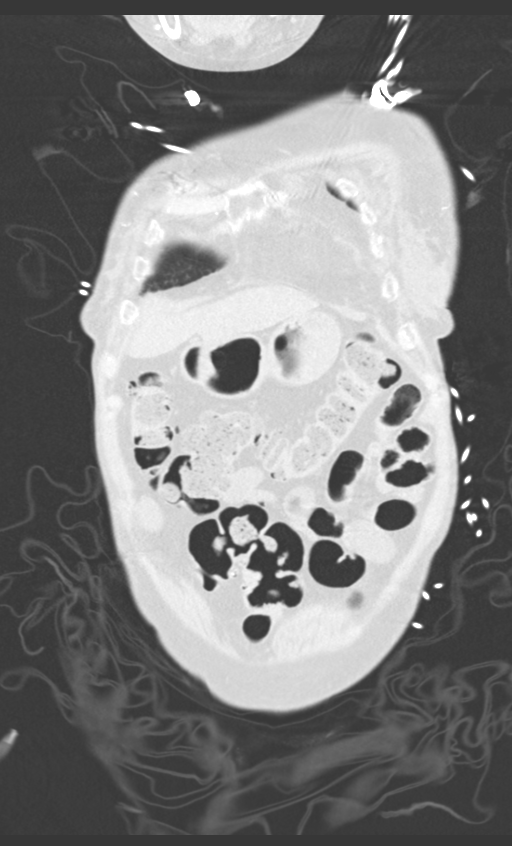
[im 47/118  lung]
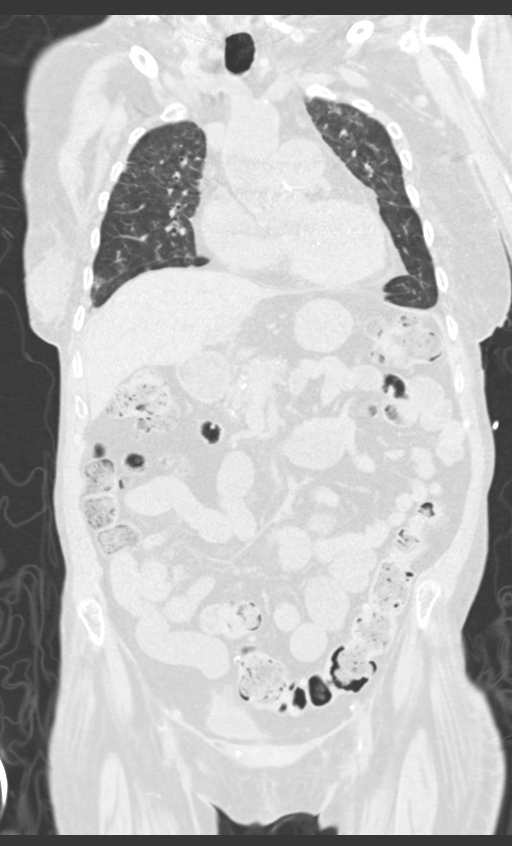
[im 71/118  lung]
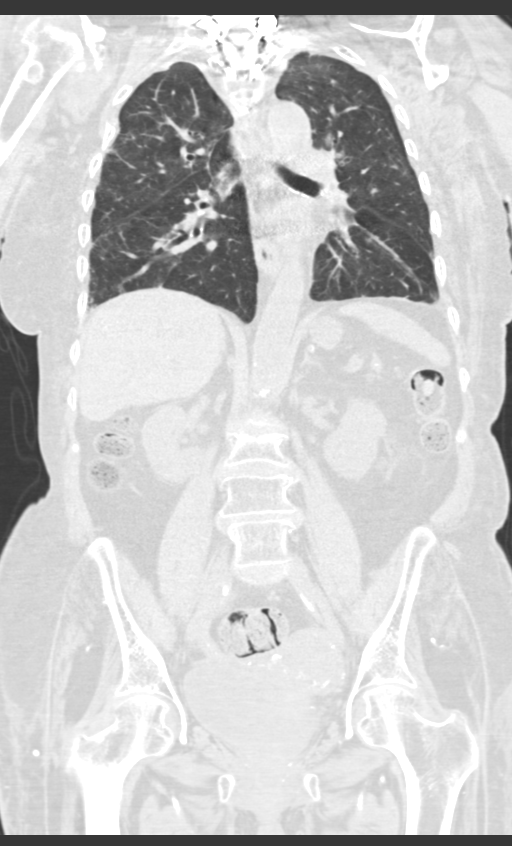

[13 of 36 positions shown; findings below may reference images not displayed]

FINDINGS: CT CHEST FINDINGS

Cardiovascular: Unenhanced imaging of the heart and great vessels
demonstrates no pericardial effusion. Normal caliber of the thoracic
aorta. Mild diffuse atherosclerosis.

Mediastinum/Nodes: No enlarged mediastinal, hilar, or axillary lymph
nodes. Thyroid gland, trachea, and esophagus demonstrate no
significant findings.

Lungs/Pleura: No acute airspace disease, effusion, or pneumothorax.
Central airways are patent.

Musculoskeletal: Multiple prior healed right-sided rib fractures. No
acute bony abnormalities. Extensive postsurgical changes from
thoracic fusion. Prior corpectomy at T4 and T5.

CT ABDOMEN PELVIS FINDINGS

Hepatobiliary: No focal liver abnormality is seen. Status post
cholecystectomy. No biliary dilatation.

Pancreas: Diffuse pancreatic atrophy. Calcifications consistent with
sequela of chronic calcific pancreatitis. No acute inflammatory
changes.

Spleen: Normal in size without focal abnormality.

Adrenals/Urinary Tract: No urinary tract calculi or obstructive
uropathy. The bladder is unremarkable. The adrenals are normal.

Stomach/Bowel: Scattered colonic diverticulosis without
diverticulitis. No bowel obstruction or ileus. Moderate stool
throughout the colon.

Vascular/Lymphatic: Aortic atherosclerosis. No enlarged abdominal or
pelvic lymph nodes.

Reproductive: Uterus and bilateral adnexa are unremarkable.

Other: Stable fat containing left inguinal hernia. No abdominal wall
hernia. No free fluid or free gas.

Musculoskeletal: No acute or destructive bony lesions. Chronic T12,
L1, and L3 compression deformities. Reconstructed images demonstrate
no additional findings.
IMPRESSION: 1. No acute intrathoracic, intra-abdominal, or intrapelvic process.
2. Sequelae of chronic calcific pancreatitis.
3. Stable fat containing left inguinal hernia.
4. Moderate fecal retention.
5. Aortic Atherosclerosis (NWFO6-HDB.B).

## 2020-05-15 IMAGING — DX DG ABDOMEN ACUTE W/ 1V CHEST
3 series · 3 of 3 positions shown · non-contrast
Comparison: CT of the abdomen and pelvis 03/24/2020.
COMPARISON: CT of the abdomen and pelvis 03/24/2020.

Addendum:
CLINICAL DATA: Abdominal pain for 3 weeks. Diffuse pain. Nausea.

EXAM:
DG ABDOMEN ACUTE W/ 1V CHEST

[abdomen erect]
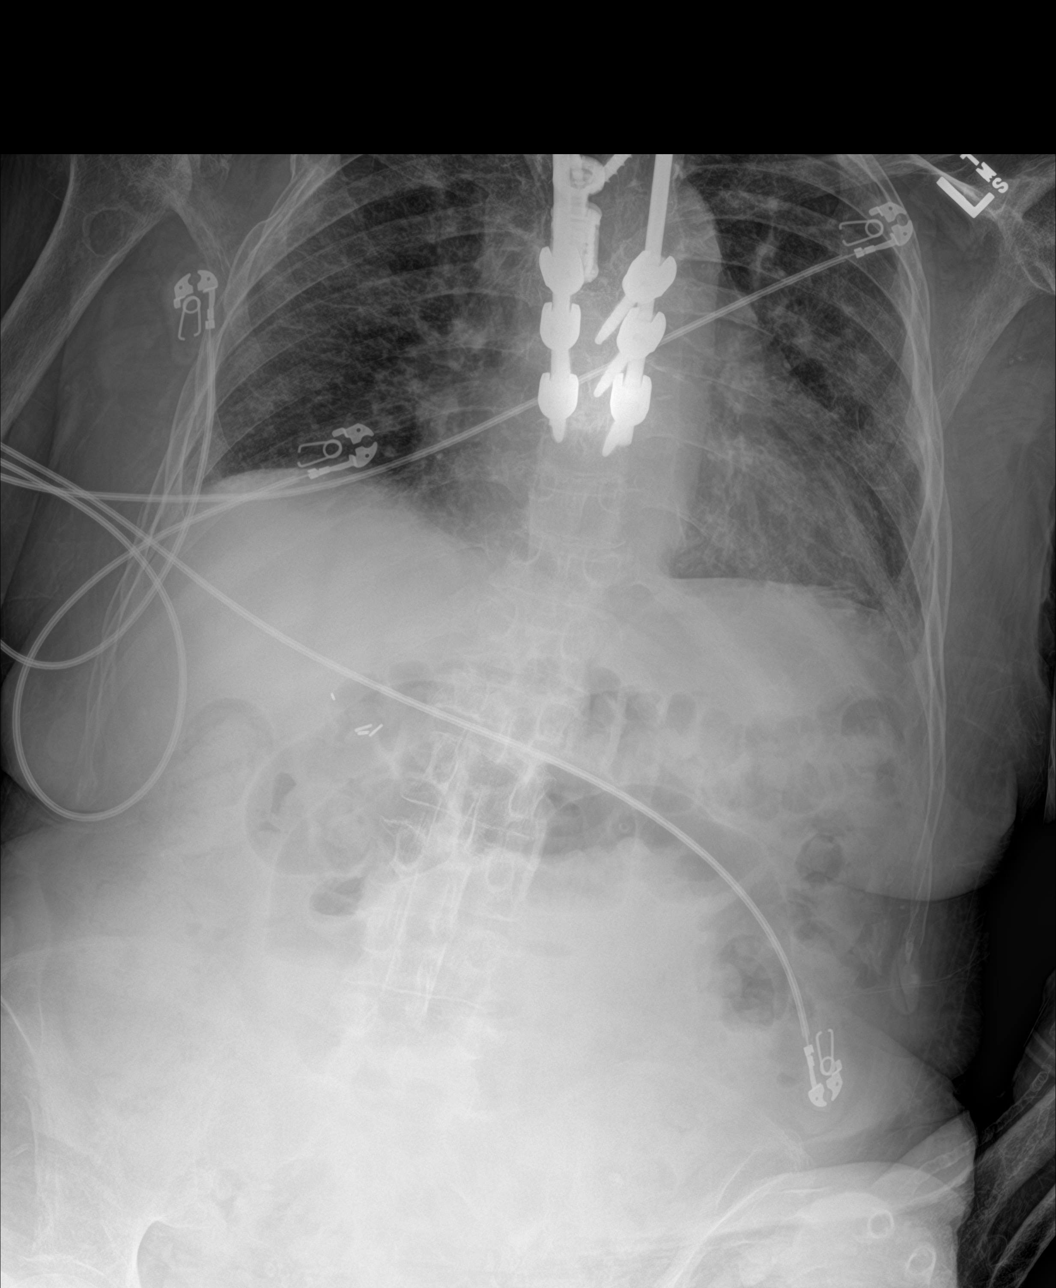

[abdomen supine]
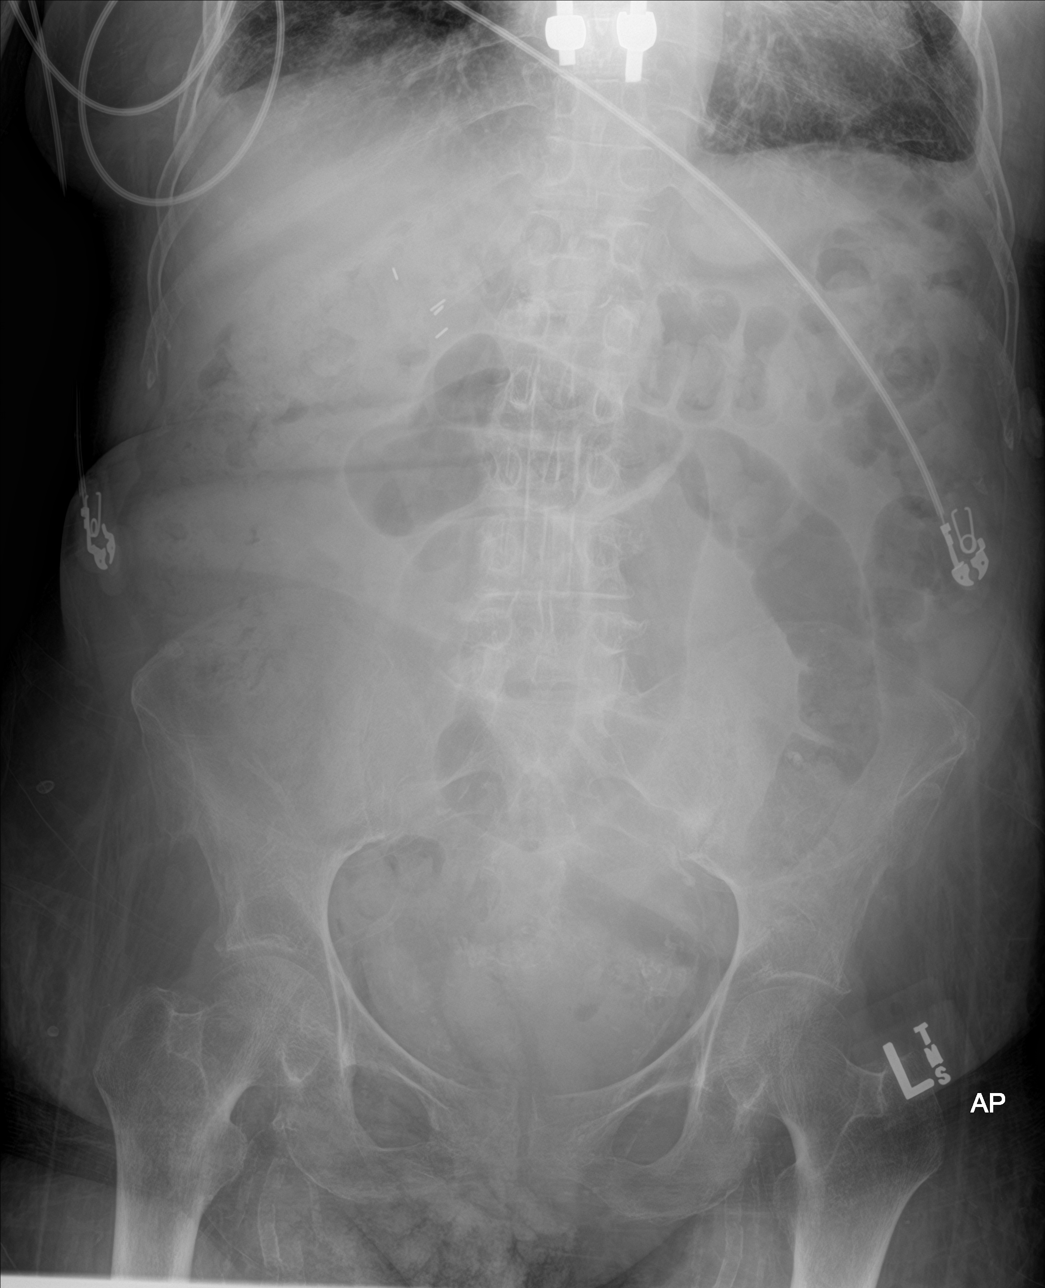

[chest ap]
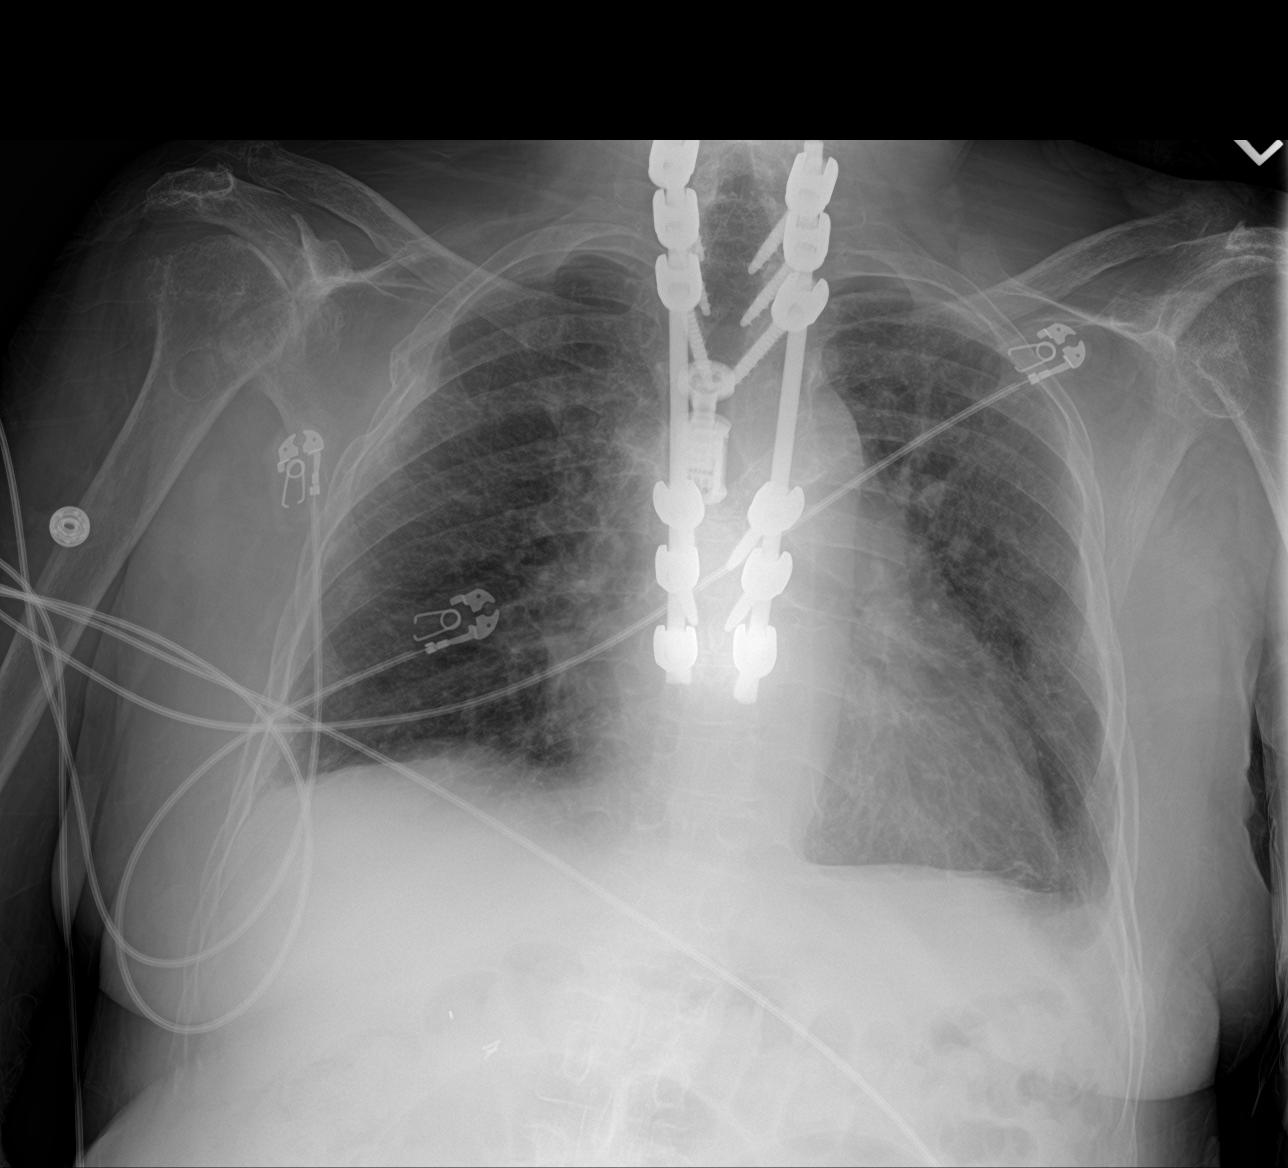

[3 of 3 positions shown; findings below may reference images not displayed]

FINDINGS: The heart is mildly enlarged. Diffuse interstitial prominence is
present. Small left pleural effusion is noted.

Mildly prominent loop of small bowel is present without obstruction.
Gas and stool are present throughout the colon. Free air is present.
Surgical clips are present at the gallbladder fossa.
IMPRESSION: 1. Free air in the abdomen compatible with recent surgery.
2. Mildly prominent loop of small bowel without obstruction. This
likely reflects focal ileus.
3. Small left pleural effusion.

ADDENDUM:
Voice recognition error: The sixth sentence of the Findings section
should read "No free air is present. "

Voice recognition error: The first sentence of the Impressions
section should read "No free air. "

*** End of Addendum ***
FINDINGS: The heart is mildly enlarged. Diffuse interstitial prominence is
present. Small left pleural effusion is noted.

Mildly prominent loop of small bowel is present without obstruction.
Gas and stool are present throughout the colon. Free air is present.
Surgical clips are present at the gallbladder fossa.
IMPRESSION: 1. Free air in the abdomen compatible with recent surgery.
2. Mildly prominent loop of small bowel without obstruction. This
likely reflects focal ileus.
3. Small left pleural effusion.

## 2020-05-28 ENCOUNTER — Emergency Department (HOSPITAL_BASED_OUTPATIENT_CLINIC_OR_DEPARTMENT_OTHER)
Admission: EM | Admit: 2020-05-28 | Discharge: 2020-05-28 | Disposition: A | Discharge status: Home / Self Care | Payer: Medicaid Other | Attending: Emergency Medicine | Admitting: Emergency Medicine

## 2020-05-28 ENCOUNTER — Encounter (HOSPITAL_BASED_OUTPATIENT_CLINIC_OR_DEPARTMENT_OTHER): Payer: Self-pay | Admitting: Emergency Medicine

## 2020-05-28 ENCOUNTER — Emergency Department (HOSPITAL_BASED_OUTPATIENT_CLINIC_OR_DEPARTMENT_OTHER): Payer: Medicaid Other

## 2020-05-28 ENCOUNTER — Other Ambulatory Visit: Payer: Self-pay

## 2020-05-28 DIAGNOSIS — Z87891 Personal history of nicotine dependence: Secondary | ICD-10-CM | POA: Insufficient documentation

## 2020-05-28 DIAGNOSIS — I5032 Chronic diastolic (congestive) heart failure: Secondary | ICD-10-CM | POA: Diagnosis not present

## 2020-05-28 DIAGNOSIS — Z7901 Long term (current) use of anticoagulants: Secondary | ICD-10-CM | POA: Diagnosis not present

## 2020-05-28 DIAGNOSIS — M25552 Pain in left hip: Secondary | ICD-10-CM

## 2020-05-28 DIAGNOSIS — I130 Hypertensive heart and chronic kidney disease with heart failure and stage 1 through stage 4 chronic kidney disease, or unspecified chronic kidney disease: Secondary | ICD-10-CM | POA: Insufficient documentation

## 2020-05-28 DIAGNOSIS — E119 Type 2 diabetes mellitus without complications: Secondary | ICD-10-CM | POA: Insufficient documentation

## 2020-05-28 DIAGNOSIS — W19XXXA Unspecified fall, initial encounter: Secondary | ICD-10-CM | POA: Diagnosis not present

## 2020-05-28 DIAGNOSIS — N183 Chronic kidney disease, stage 3 unspecified: Secondary | ICD-10-CM | POA: Diagnosis not present

## 2020-05-28 NOTE — Discharge Instructions (Signed)

## 2020-05-28 NOTE — ED Provider Notes (Signed)
Emergency Department Provider Note   I have reviewed the triage vital signs and the nursing notes.   HISTORY  Chief Complaint Fall   HPI Amber [name] is a 64 y.o. female with past medical history reviewed below presents to the emergency department for evaluation after mechanical fall.  Patient states that she was getting out of bed today when her feet slipped and she fell landing on her left side.  She is having pain in the left hip and buttock since the fall.  Patient is on blood thinners but states she landed on her buttocks and did not hit her head either on the floor or other surrounding objects.  There was no loss of consciousness.  She is not experiencing extreme lower back pain.  No numbness or weakness.  Her pain is mainly in the left hip and buttock and was encouraged to come and get checked out by family.   Past Medical History:  Diagnosis Date  . ACS (acute coronary syndrome) (HCC) 06/12/2015  . Acute on chronic blood loss anemia 11/25/2017  . Arthritis   . Asthma   . 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 Chronic pain   . Constipation   . Diabetes mellitus without complication (HCC)   . Duodenitis   . Embolic stroke (HCC)   . Falls frequently   . Focal seizure (HCC)   . GERD (gastroesophageal reflux disease)   . Hypertension   . Impacted fracture 02/15/2017   right glenoid  . Pneumonia 10/2015  . Seizures (HCC)    last seizure March 2015  . Small bowel obstruction (HCC) 11/25/2017    Patient Active Problem List   Diagnosis Date Noted  . UTI (urinary tract infection) 03/25/2020  . SIRS (systemic inflammatory response syndrome) (HCC) 03/25/2020  . Abdominal pain 03/25/2020  . Diarrhea 03/25/2020  . CVA (cerebral vascular accident) (HCC) 02/13/2020  . Hyperkalemia   . Stroke (HCC) 12/25/2019  . Acute ischemic stroke (HCC) 12/24/2019  . Chronic diastolic (congestive) heart failure (HCC) 02/05/2019  . CKD (chronic kidney disease)  stage 3, GFR 30-59 ml/min 02/05/2019  . Chronic anticoagulation 02/05/2019  . Palliative care encounter   . Septic shock (HCC) 12/29/2018  . Pressure injury of skin 10/11/2018  . Sepsis due to pneumonia (HCC) 10/10/2018  . Failure to thrive in adult 10/10/2018  . Rheumatoid arthritis (HCC) 10/10/2018  . Anasarca 10/10/2018  . HCAP (healthcare-associated pneumonia) 09/24/2018  . Acute metabolic encephalopathy 05/09/2018  . Metabolic acidosis 05/09/2018  . Mild persistent asthma 05/09/2018  . 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 Moderate malnutrition (HCC) 05/07/2018  . Chronic anemia 05/06/2018  . PFO (patent foramen ovale) 02/15/2017  . History of CVA with residual deficit 02/15/2017  . Drug-induced hyperglycemia 02/15/2017  . Chest pain 11/23/2016  . Seizures (HCC) 10/26/2016  . 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 Sepsis (HCC) 02/29/2016  . Constipation 11/07/2015  . Hypomagnesemia 08/25/2015  . Diabetes mellitus type 2, controlled (HCC) 07/27/2015  . Chronic back pain   . Frequent falls   . Hypertensive heart disease   . Pulmonary hypertension (HCC)   . Gout   . Microcytic anemia 01/26/2015  . History of seizure 01/26/2015    Past Surgical History:  Procedure Laterality Date  . APPENDECTOMY    . BACK SURGERY    . CHOLECYSTECTOMY    . TEE WITHOUT CARDIOVERSION N/A 10/30/2016   Procedure: TRANSESOPHAGEAL ECHOCARDIOGRAM (TEE);  Surgeon: Lars Masson, MD;  Location: [facility];  Service: Cardiovascular;  Laterality: N/A;    Allergies Ivp dye [iodinated diagnostic agents] and Metrizamide  Family History  Problem Relation Age of Onset  . Kidney failure Mother   . Hypertension Sister   . Hypertension Brother     Social History Social History   Tobacco Use  . Smoking status: Former Smoker    Quit date: 2010    Years since quitting: 11.5  . Smokeless tobacco: Never Used  Vaping Use  . Vaping Use: Never used  Substance Use Topics  .  Alcohol use: Not Currently  . Drug use: No    Review of Systems  Constitutional: No fever/chills Eyes: No visual changes. ENT: No sore throat. Cardiovascular: Denies chest pain. Respiratory: Denies shortness of breath. Gastrointestinal: No abdominal pain.   Genitourinary: Negative for dysuria. Musculoskeletal: Negative for back pain. Positive left hip pain.  Skin: Negative for rash. Neurological: Negative for headaches, focal weakness or numbness.  10-point ROS otherwise negative.  ____________________________________________   PHYSICAL EXAM:  VITAL SIGNS: ED Triage Vitals  Enc Vitals Group     BP 05/28/20 1826 (!) 119/92     Pulse Rate 05/28/20 1826 88     Resp 05/28/20 1826 16     Temp 05/28/20 1826 98.6 F (37 C)     Temp Source 05/28/20 1826 Oral     SpO2 05/28/20 1826 100 %     Weight 05/28/20 1827 115 lb (52.2 kg)     Height 05/28/20 1827 5' (1.524 m)   Constitutional: Alert and oriented. Well appearing and in no acute distress. Head: Atraumatic. Nose: No congestion/rhinnorhea. Mouth/Throat: Mucous membranes are moist.  Oropharynx non-erythematous. Neck: No stridor. No c-spine tenderness.  Cardiovascular: Normal rate, regular rhythm. Good peripheral circulation. Grossly normal heart sounds.   Respiratory: Normal respiratory effort.  No retractions. Lungs CTAB. Gastrointestinal: Soft and nontender. No distention.  Musculoskeletal: Pain with passive ROM of the left hip but ROM not limited.  Neurologic:  Normal speech and language. Normal strength and sensation in the bilateral LEs.  Skin:  Skin is warm, dry and intact. No rash noted.  ____________________________________________  RADIOLOGY  DG Hip Unilat W or Wo Pelvis 2-3 Views Left  Result Date: 05/28/2020 CLINICAL DATA:  Fall with left-sided hip pain EXAM: DG HIP (WITH OR WITHOUT PELVIS) 2-3V LEFT COMPARISON:  CT 03/24/2020 FINDINGS: Sacrum is obscured by stool and bowel gas. Chronic deformity at the right  pubic symphysis. No acute displaced fracture or malalignment. Vascular calcifications. IMPRESSION: No acute osseous abnormality. Electronically Signed   By: Jasmine Pang M.D.   On: 05/28/2020 19:12    ____________________________________________   PROCEDURES  Procedure(s) performed:   Procedures  None  ____________________________________________   INITIAL IMPRESSION / ASSESSMENT AND PLAN / ED COURSE  Pertinent labs & imaging results that were available during my care of the patient were reviewed by me and considered in my medical decision making (see chart for details).   Patient presents to the emergency department for evaluation after fall with left hip pain.  Imaging reviewed showing no acute fracture.  Patient did not strike her head.  There is no outward sign of head trauma.  Normal neurologic exam.  No midline spine tenderness on exam.  Plan for pain mgmt at home OTC meds and PCP follow up.    ____________________________________________  FINAL CLINICAL IMPRESSION(S) / ED DIAGNOSES  Final diagnoses:  Fall, initial encounter  Left hip pain    Note:  This document was  prepared using Conservation officer, historic buildings and may include unintentional dictation errors.  Alona Bene, MD, [facility] Emergency Medicine    Kalley Nicholl, Arlyss Repress, MD 05/29/20 [phone]

## 2020-05-28 NOTE — ED Triage Notes (Signed)
Pt fell onto left hip today.  Reports pain in left buttock. Sts he son made her come get it checked out.

## 2020-05-28 NOTE — ED Triage Notes (Signed)
Arrived ems  Per ems Pt slid from bed to floor this am due to feet slipping  Co lower back pain  Is on blood thinners but did not hit head

## 2020-07-14 IMAGING — DX DG HIP (WITH OR WITHOUT PELVIS) 2-3V*L*
3 series · 3 of 3 positions shown · non-contrast
Comparison: CT 03/24/2020

CLINICAL DATA: Fall with left-sided hip pain

EXAM:
DG HIP (WITH OR WITHOUT PELVIS) 2-3V LEFT

[pelvis ap]
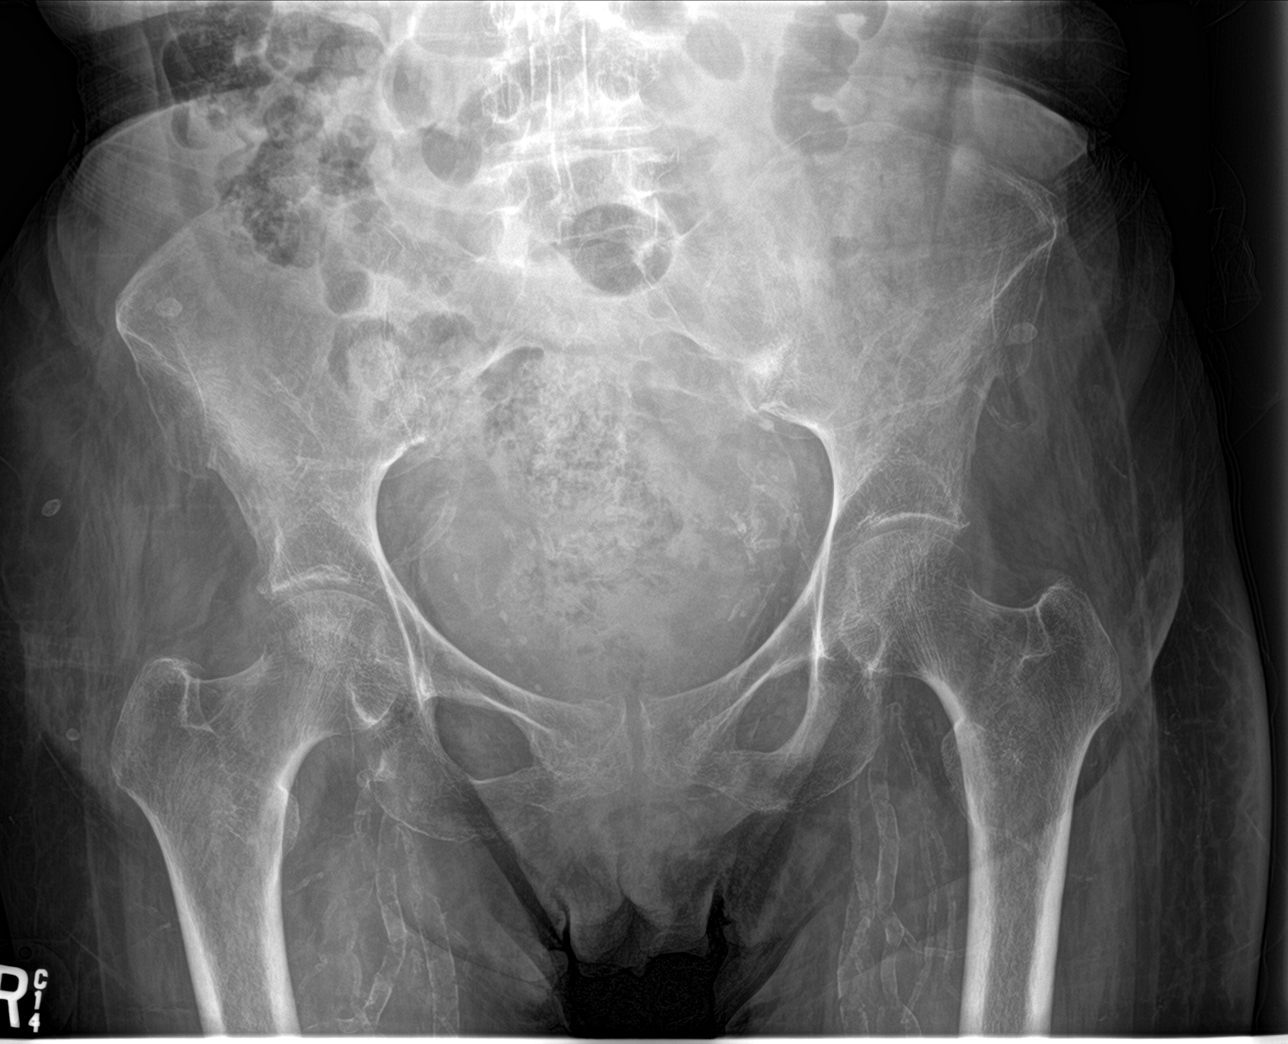

[hip ap]
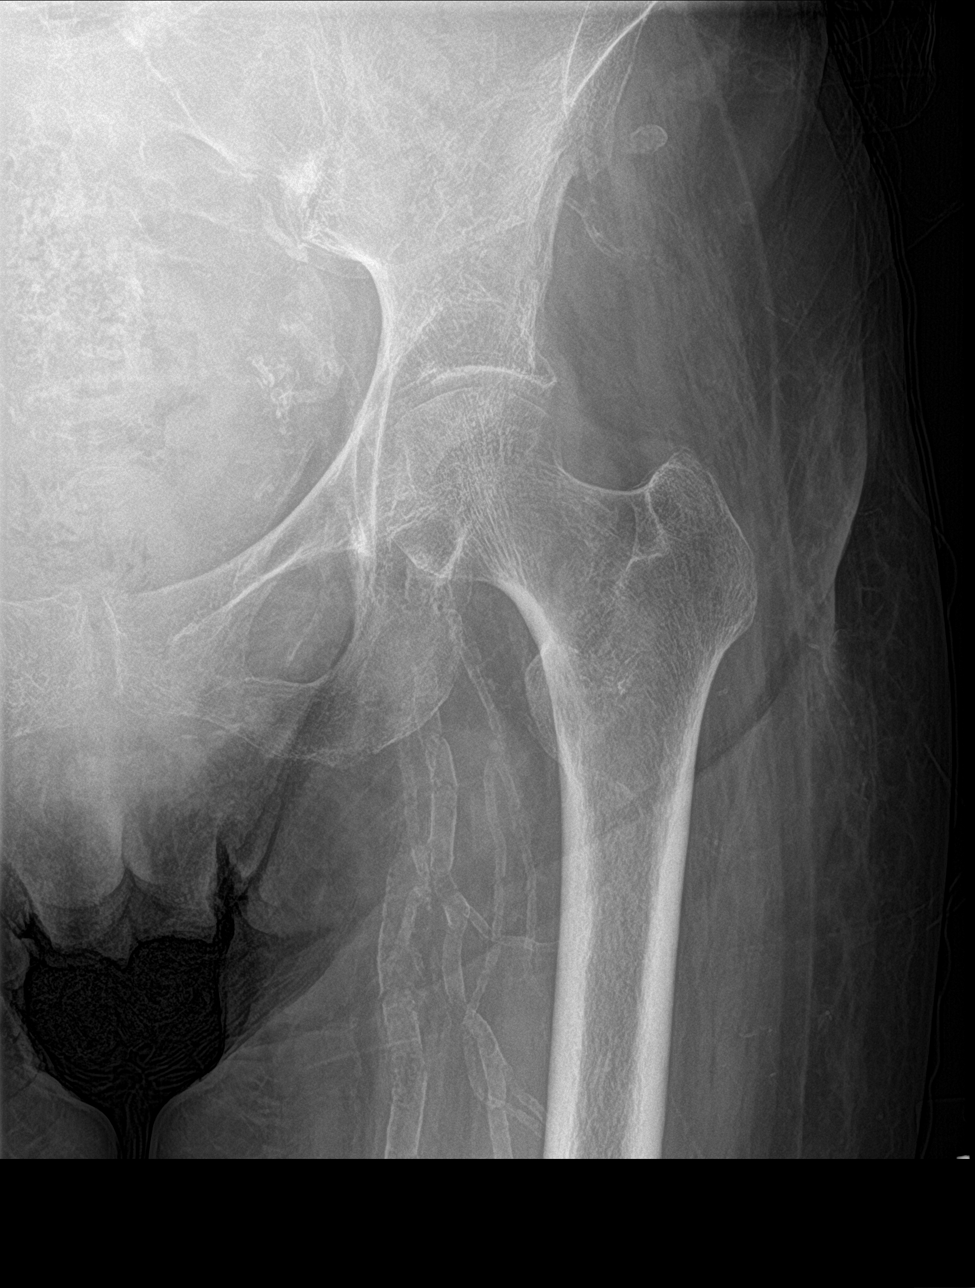

[hip lat]
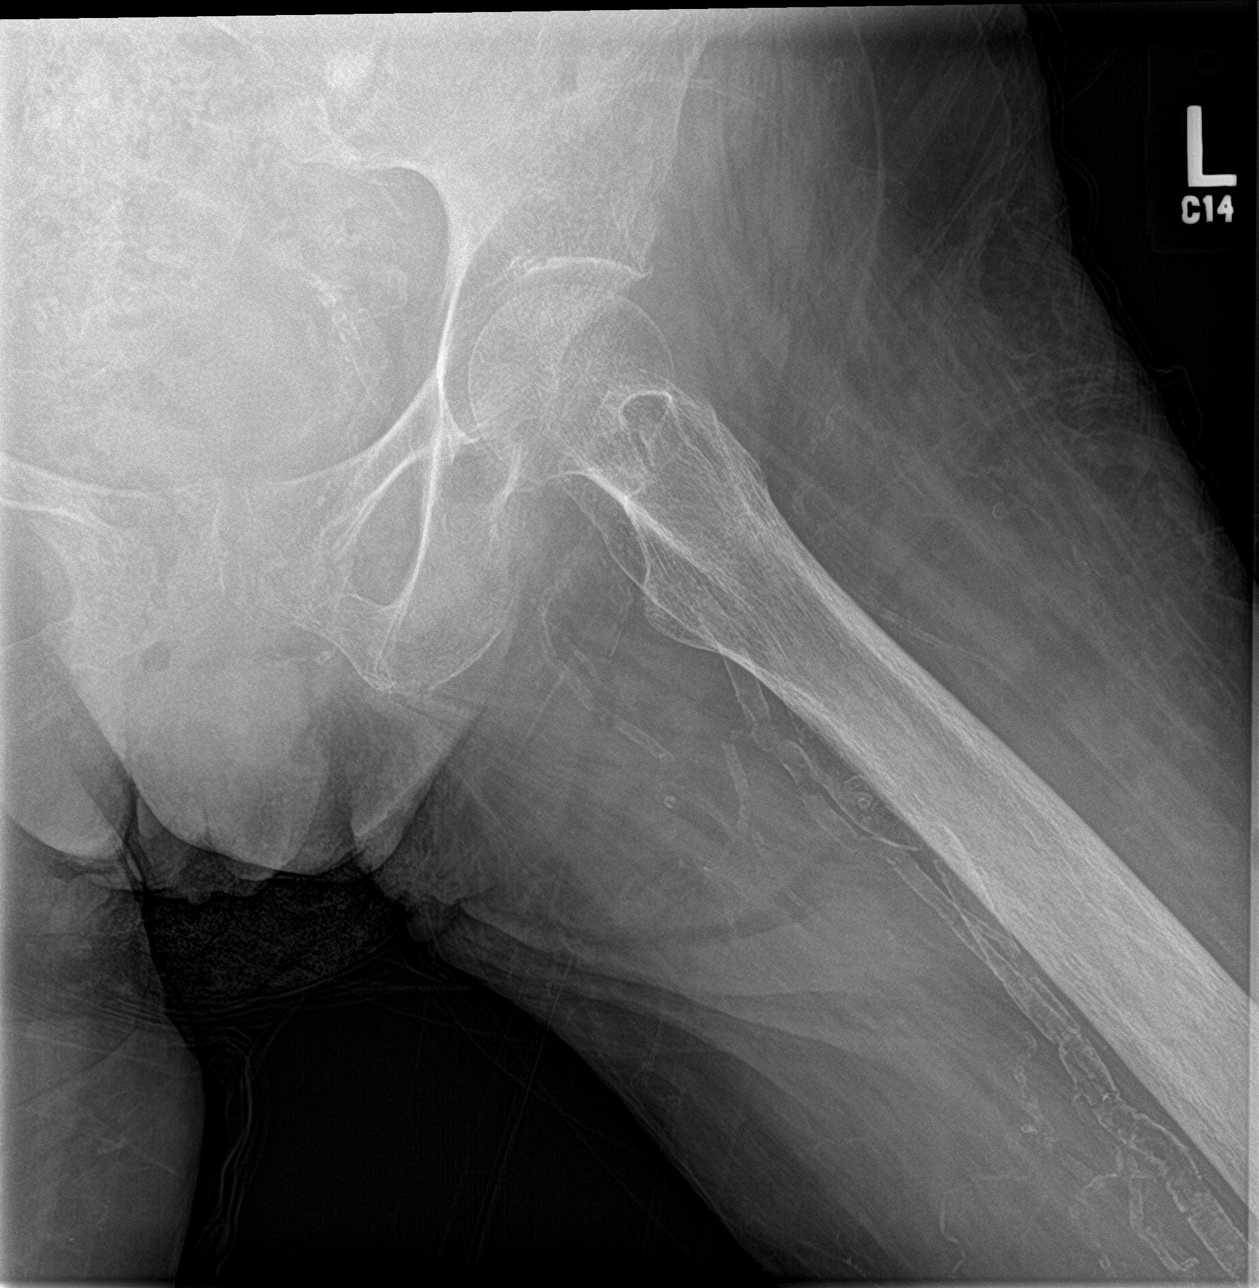

[3 of 3 positions shown; findings below may reference images not displayed]

FINDINGS: Sacrum is obscured by stool and bowel gas. Chronic deformity at the
right pubic symphysis. No acute displaced fracture or malalignment.
Vascular calcifications.
IMPRESSION: No acute osseous abnormality.

## 2020-08-15 ENCOUNTER — Emergency Department (HOSPITAL_COMMUNITY): Payer: Medicaid Other

## 2020-08-15 ENCOUNTER — Other Ambulatory Visit: Payer: Self-pay

## 2020-08-15 ENCOUNTER — Inpatient Hospital Stay (HOSPITAL_COMMUNITY)
Admission: EM | Admit: 2020-08-15 | Discharge: 2020-08-20 | DRG: 872 | Disposition: A | Discharge status: Home / Self Care | Payer: Medicaid Other | Attending: Family Medicine | Admitting: Family Medicine

## 2020-08-15 ENCOUNTER — Encounter (HOSPITAL_COMMUNITY): Payer: Self-pay | Admitting: Emergency Medicine

## 2020-08-15 DIAGNOSIS — G40909 Epilepsy, unspecified, not intractable, without status epilepticus: Secondary | ICD-10-CM | POA: Diagnosis present

## 2020-08-15 DIAGNOSIS — Z8249 Family history of ischemic heart disease and other diseases of the circulatory system: Secondary | ICD-10-CM | POA: Diagnosis not present

## 2020-08-15 DIAGNOSIS — Z9049 Acquired absence of other specified parts of digestive tract: Secondary | ICD-10-CM | POA: Diagnosis not present

## 2020-08-15 DIAGNOSIS — Z6823 Body mass index (BMI) 23.0-23.9, adult: Secondary | ICD-10-CM

## 2020-08-15 DIAGNOSIS — Z841 Family history of disorders of kidney and ureter: Secondary | ICD-10-CM | POA: Diagnosis not present

## 2020-08-15 DIAGNOSIS — E11649 Type 2 diabetes mellitus with hypoglycemia without coma: Secondary | ICD-10-CM | POA: Diagnosis not present

## 2020-08-15 DIAGNOSIS — E876 Hypokalemia: Secondary | ICD-10-CM | POA: Diagnosis present

## 2020-08-15 DIAGNOSIS — Z87898 Personal history of other specified conditions: Secondary | ICD-10-CM

## 2020-08-15 DIAGNOSIS — Z20822 Contact with and (suspected) exposure to covid-19: Secondary | ICD-10-CM | POA: Diagnosis present

## 2020-08-15 DIAGNOSIS — Z87891 Personal history of nicotine dependence: Secondary | ICD-10-CM | POA: Diagnosis not present

## 2020-08-15 DIAGNOSIS — Z7901 Long term (current) use of anticoagulants: Secondary | ICD-10-CM | POA: Diagnosis not present

## 2020-08-15 DIAGNOSIS — N390 Urinary tract infection, site not specified: Secondary | ICD-10-CM

## 2020-08-15 DIAGNOSIS — N179 Acute kidney failure, unspecified: Secondary | ICD-10-CM | POA: Diagnosis present

## 2020-08-15 DIAGNOSIS — Z91041 Radiographic dye allergy status: Secondary | ICD-10-CM | POA: Diagnosis not present

## 2020-08-15 DIAGNOSIS — K5900 Constipation, unspecified: Secondary | ICD-10-CM | POA: Diagnosis present

## 2020-08-15 DIAGNOSIS — D509 Iron deficiency anemia, unspecified: Secondary | ICD-10-CM | POA: Diagnosis present

## 2020-08-15 DIAGNOSIS — I5032 Chronic diastolic (congestive) heart failure: Secondary | ICD-10-CM | POA: Diagnosis present

## 2020-08-15 DIAGNOSIS — R627 Adult failure to thrive: Secondary | ICD-10-CM | POA: Diagnosis present

## 2020-08-15 DIAGNOSIS — D696 Thrombocytopenia, unspecified: Secondary | ICD-10-CM | POA: Diagnosis present

## 2020-08-15 DIAGNOSIS — J45909 Unspecified asthma, uncomplicated: Secondary | ICD-10-CM | POA: Diagnosis present

## 2020-08-15 DIAGNOSIS — I110 Hypertensive heart disease with heart failure: Secondary | ICD-10-CM | POA: Diagnosis present

## 2020-08-15 DIAGNOSIS — Z87440 Personal history of urinary (tract) infections: Secondary | ICD-10-CM

## 2020-08-15 DIAGNOSIS — Z9109 Other allergy status, other than to drugs and biological substances: Secondary | ICD-10-CM | POA: Diagnosis not present

## 2020-08-15 DIAGNOSIS — K219 Gastro-esophageal reflux disease without esophagitis: Secondary | ICD-10-CM | POA: Diagnosis present

## 2020-08-15 DIAGNOSIS — A419 Sepsis, unspecified organism: Secondary | ICD-10-CM | POA: Diagnosis present

## 2020-08-15 DIAGNOSIS — Z79899 Other long term (current) drug therapy: Secondary | ICD-10-CM | POA: Diagnosis not present

## 2020-08-15 DIAGNOSIS — N183 Chronic kidney disease, stage 3 unspecified: Secondary | ICD-10-CM | POA: Diagnosis present

## 2020-08-15 DIAGNOSIS — A4150 Gram-negative sepsis, unspecified: Secondary | ICD-10-CM

## 2020-08-15 DIAGNOSIS — E119 Type 2 diabetes mellitus without complications: Secondary | ICD-10-CM

## 2020-08-15 DIAGNOSIS — Z8673 Personal history of transient ischemic attack (TIA), and cerebral infarction without residual deficits: Secondary | ICD-10-CM | POA: Diagnosis not present

## 2020-08-15 DIAGNOSIS — R509 Fever, unspecified: Secondary | ICD-10-CM

## 2020-08-15 DIAGNOSIS — E860 Dehydration: Secondary | ICD-10-CM | POA: Diagnosis present

## 2020-08-15 DIAGNOSIS — K529 Noninfective gastroenteritis and colitis, unspecified: Secondary | ICD-10-CM | POA: Diagnosis present

## 2020-08-15 HISTORY — DX: Heart failure, unspecified: I50.9

## 2020-08-15 LAB — CBC
HCT: 31.6 % — ABNORMAL LOW (ref 36.0–46.0)
Hemoglobin: 10.3 g/dL — ABNORMAL LOW (ref 12.0–15.0)
MCH: 26.8 pg (ref 26.0–34.0)
MCHC: 32.6 g/dL (ref 30.0–36.0)
MCV: 82.3 fL (ref 80.0–100.0)
Platelets: 87 10*3/uL — ABNORMAL LOW (ref 150–400)
RBC: 3.84 MIL/uL — ABNORMAL LOW (ref 3.87–5.11)
RDW: 19.8 % — ABNORMAL HIGH (ref 11.5–15.5)
WBC: 17.5 10*3/uL — ABNORMAL HIGH (ref 4.0–10.5)
nRBC: 0 % (ref 0.0–0.2)

## 2020-08-15 LAB — URINALYSIS, ROUTINE W REFLEX MICROSCOPIC
Bacteria, UA: NONE SEEN
Bilirubin Urine: NEGATIVE
Glucose, UA: NEGATIVE mg/dL
Hgb urine dipstick: NEGATIVE
Ketones, ur: NEGATIVE mg/dL
Nitrite: NEGATIVE
Protein, ur: NEGATIVE mg/dL
Specific Gravity, Urine: 1.015 (ref 1.005–1.030)
WBC, UA: >50 WBC/hpf — ABNORMAL HIGH (ref 0–5)
pH: 6 (ref 5.0–8.0)

## 2020-08-15 LAB — COMPREHENSIVE METABOLIC PANEL
ALT: 11 U/L (ref 0–44)
AST: 23 U/L (ref 15–41)
Albumin: 2.1 g/dL — ABNORMAL LOW (ref 3.5–5.0)
Alkaline Phosphatase: 99 U/L (ref 38–126)
Anion gap: 12 (ref 5–15)
BUN: 10 mg/dL (ref 8–23)
CO2: 15 mmol/L — ABNORMAL LOW (ref 22–32)
Calcium: 8 mg/dL — ABNORMAL LOW (ref 8.9–10.3)
Chloride: 112 mmol/L — ABNORMAL HIGH (ref 98–111)
Creatinine, Ser: 1.1 mg/dL — ABNORMAL HIGH (ref 0.44–1.00)
GFR calc Af Amer: >60 mL/min (ref >60)
GFR calc non Af Amer: 54 mL/min — ABNORMAL LOW (ref >60)
Glucose, Bld: 100 mg/dL — ABNORMAL HIGH (ref 70–99)
Potassium: 3.3 mmol/L — ABNORMAL LOW (ref 3.5–5.1)
Sodium: 139 mmol/L (ref 135–145)
Total Bilirubin: 0.6 mg/dL (ref 0.3–1.2)
Total Protein: 5.7 g/dL — ABNORMAL LOW (ref 6.5–8.1)

## 2020-08-15 LAB — PROTIME-INR
INR: 1.3 — ABNORMAL HIGH (ref 0.8–1.2)
Prothrombin Time: 16 seconds — ABNORMAL HIGH (ref 11.4–15.2)

## 2020-08-15 LAB — RESPIRATORY PANEL BY RT PCR (FLU A&B, COVID)
Influenza A by PCR: NEGATIVE
Influenza B by PCR: NEGATIVE
SARS Coronavirus 2 by RT PCR: NEGATIVE

## 2020-08-15 LAB — POC OCCULT BLOOD, ED: Fecal Occult Bld: POSITIVE — AB

## 2020-08-15 LAB — LIPASE, BLOOD: Lipase: 22 U/L (ref 11–51)

## 2020-08-15 LAB — APTT: aPTT: 34 seconds (ref 24–36)

## 2020-08-15 LAB — LACTIC ACID, PLASMA: Lactic Acid, Venous: 1.5 mmol/L (ref 0.5–1.9)

## 2020-08-15 MED ORDER — METRONIDAZOLE IN NACL 5-0.79 MG/ML-% IV SOLN
500.0000 mg | Freq: Three times a day (TID) | INTRAVENOUS | Status: DC
  Administered 2020-08-16 – 2020-08-19 (×10): 500 mg via INTRAVENOUS
  Filled 2020-08-15 (×10): qty 100

## 2020-08-15 MED ORDER — APIXABAN 5 MG PO TABS
5.0000 mg | ORAL_TABLET | Freq: Two times a day (BID) | ORAL | Status: DC
  Administered 2020-08-15 – 2020-08-20 (×10): 5 mg via ORAL
  Filled 2020-08-15 (×10): qty 1

## 2020-08-15 MED ORDER — SODIUM CHLORIDE 0.9 % IV SOLN
1.0000 g | Freq: Once | INTRAVENOUS | Status: AC
  Administered 2020-08-15: 1 g via INTRAVENOUS
  Filled 2020-08-15: qty 10

## 2020-08-15 MED ORDER — METRONIDAZOLE IN NACL 5-0.79 MG/ML-% IV SOLN
500.0000 mg | Freq: Once | INTRAVENOUS | Status: AC
  Administered 2020-08-15: 500 mg via INTRAVENOUS
  Filled 2020-08-15: qty 100

## 2020-08-15 MED ORDER — ONDANSETRON HCL 4 MG PO TABS: 4.0000 mg | ORAL_TABLET | Freq: Four times a day (QID) | ORAL | Status: DC | PRN

## 2020-08-15 MED ORDER — ACETAMINOPHEN 650 MG RE SUPP: 650.0000 mg | Freq: Four times a day (QID) | RECTAL | Status: DC | PRN

## 2020-08-15 MED ORDER — SODIUM CHLORIDE 0.9 % IV SOLN
2.0000 g | INTRAVENOUS | Status: DC
  Administered 2020-08-16 – 2020-08-18 (×3): 2 g via INTRAVENOUS
  Filled 2020-08-15 (×3): qty 20

## 2020-08-15 MED ORDER — SODIUM CHLORIDE 0.9 % IV BOLUS
500.0000 mL | Freq: Once | INTRAVENOUS | Status: AC
  Administered 2020-08-15: 500 mL via INTRAVENOUS

## 2020-08-15 MED ORDER — LACTATED RINGERS IV SOLN: INTRAVENOUS | Status: DC

## 2020-08-15 MED ORDER — ONDANSETRON HCL 4 MG/2ML IJ SOLN
4.0000 mg | Freq: Four times a day (QID) | INTRAMUSCULAR | Status: DC | PRN
  Administered 2020-08-16 – 2020-08-18 (×3): 4 mg via INTRAVENOUS
  Filled 2020-08-15 (×3): qty 2

## 2020-08-15 MED ORDER — ACETAMINOPHEN 325 MG PO TABS
650.0000 mg | ORAL_TABLET | Freq: Four times a day (QID) | ORAL | Status: DC | PRN
  Administered 2020-08-16 – 2020-08-19 (×7): 650 mg via ORAL
  Filled 2020-08-15 (×7): qty 2

## 2020-08-15 NOTE — ED Provider Notes (Signed)
[facility] EMERGENCY DEPARTMENT Provider Note   CSN: [id number] Arrival date & time: 08/15/20  1156     History Chief Complaint  Patient presents with  . Generalized Body Aches  . Dysuria    Amber [name] is a 64 y.o. female.  64 year old female with history of small bowel obstruction, sepsis/pneumonia, CVA, additional history listed below brought in by EMS for abdominal pain x3 days with concern for fever today.  Patient denies nausea, vomiting, cough or shortness of breath, chest pain.  Reports possible dysuria, denies diarrhea.  No known sick contacts.  Patient is not vaccinated against COVID-19.  No other complaints or concerns today.  Called patient's son for additional history, states that patient has reported abdominal pain for the past few days and when he went to check on her today, she stated that she was feeling cold he suspected she might have a fever.  Patient was not given anything for possible fever.  Amber [name] was evaluated in Emergency Department on 08/15/2020 for the symptoms described in the history of present illness. She was evaluated in the context of the global COVID-19 pandemic, which necessitated consideration that the patient might be at risk for infection with the SARS-CoV-2 virus that causes COVID-19. Institutional protocols and algorithms that pertain to the evaluation of patients at risk for COVID-19 are in a state of rapid change based on information released by regulatory bodies including the CDC and federal and state organizations. These policies and algorithms were followed during the patient's care in the ED.         Past Medical History:  Diagnosis Date  . ACS (acute coronary syndrome) (HCC) 06/12/2015  . Acute on chronic blood loss anemia 11/25/2017  . Arthritis   . Asthma   . 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 Chronic pain   . Constipation   . Diabetes mellitus without complication (HCC)   .  Duodenitis   . Embolic stroke (HCC)   . Falls frequently   . Focal seizure (HCC)   . GERD (gastroesophageal reflux disease)   . Hypertension   . Impacted fracture 02/15/2017   right glenoid  . Pneumonia 10/2015  . Seizures (HCC)    last seizure March 2015  . Small bowel obstruction (HCC) 11/25/2017    Patient Active Problem List   Diagnosis Date Noted  . UTI (urinary tract infection) 03/25/2020  . SIRS (systemic inflammatory response syndrome) (HCC) 03/25/2020  . Abdominal pain 03/25/2020  . Diarrhea 03/25/2020  . CVA (cerebral vascular accident) (HCC) 02/13/2020  . Hyperkalemia   . Stroke (HCC) 12/25/2019  . Acute ischemic stroke (HCC) 12/24/2019  . Chronic diastolic (congestive) heart failure (HCC) 02/05/2019  . CKD (chronic kidney disease) stage 3, GFR 30-59 ml/min 02/05/2019  . Chronic anticoagulation 02/05/2019  . Palliative care encounter   . Septic shock (HCC) 12/29/2018  . Pressure injury of skin 10/11/2018  . Sepsis due to pneumonia (HCC) 10/10/2018  . Failure to thrive in adult 10/10/2018  . Rheumatoid arthritis (HCC) 10/10/2018  . Anasarca 10/10/2018  . HCAP (healthcare-associated pneumonia) 09/24/2018  . Acute metabolic encephalopathy 05/09/2018  . Metabolic acidosis 05/09/2018  . Mild persistent asthma 05/09/2018  . 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 Moderate malnutrition (HCC) 05/07/2018  . Chronic anemia 05/06/2018  . PFO (patent foramen ovale) 02/15/2017  . History of CVA with residual deficit 02/15/2017  . Drug-induced hyperglycemia 02/15/2017  . Chest pain 11/23/2016  . Seizures (HCC) 10/26/2016  . Cerebrovascular  accident (CVA) due to embolism (HCC) 10/26/2016  . Sepsis (HCC) 02/29/2016  . Constipation 11/07/2015  . Hypomagnesemia 08/25/2015  . Diabetes mellitus type 2, controlled (HCC) 07/27/2015  . Chronic back pain   . Frequent falls   . Hypertensive heart disease   . Pulmonary hypertension (HCC)   . Gout   . Microcytic anemia 01/26/2015  . History  of seizure 01/26/2015    Past Surgical History:  Procedure Laterality Date  . APPENDECTOMY    . BACK SURGERY    . CHOLECYSTECTOMY    . TEE WITHOUT CARDIOVERSION N/A 10/30/2016   Procedure: TRANSESOPHAGEAL ECHOCARDIOGRAM (TEE);  Surgeon: Lars Masson, MD;  Location: [facility] ENDOSCOPY;  Service: Cardiovascular;  Laterality: N/A;     OB History   No obstetric history on file.     Family History  Problem Relation Age of Onset  . Kidney failure Mother   . Hypertension Sister   . Hypertension Brother     Social History   Tobacco Use  . Smoking status: Former Smoker    Quit date: 2010    Years since quitting: 11.7  . Smokeless tobacco: Never Used  Vaping Use  . Vaping Use: Never used  Substance Use Topics  . Alcohol use: Not Currently  . Drug use: No    Home Medications Prior to Admission medications   Medication Sig Start Date End Date Taking? Authorizing Provider  acetaminophen (TYLENOL) 500 MG tablet Take 2 tablets (1,000 mg total) by mouth every 8 (eight) hours. Patient taking differently: Take 1,000 mg by mouth every 8 (eight) hours as needed for mild pain or moderate pain.  12/30/19   Almon Hercules, MD  albuterol (PROVENTIL HFA;VENTOLIN HFA) 108 (90 Base) MCG/ACT inhaler Inhale 2 puffs into the lungs every 4 (four) hours as needed for wheezing or shortness of breath. 01/16/19   Glade Lloyd, MD  allopurinol (ZYLOPRIM) 300 MG tablet Take 300 mg by mouth daily. 11/11/16   [provider]  amLODipine (NORVASC) 10 MG tablet Take 10 mg by mouth daily.    [provider]  amLODipine (NORVASC) 5 MG tablet Take 5 mg by mouth daily. 04/09/20   [provider]  apixaban (ELIQUIS) 5 MG TABS tablet Take 1 tablet (5 mg total) by mouth 2 (two) times daily. 02/14/20   Joseph Art, DO  atorvastatin (LIPITOR) 10 MG tablet Take 10 mg by mouth daily at 6 PM.  08/13/15   [provider]  calcium-vitamin D (OSCAL WITH D) 500-200 MG-UNIT tablet Take 2  tablets by mouth daily with breakfast. 12/30/19   Almon Hercules, MD  CREON 24000-76000 units CPEP Take 1 capsule by mouth 3 (three) times daily.  09/23/19   [provider]  esomeprazole (NEXIUM) 40 MG capsule Take 40 mg by mouth 2 (two) times daily before a meal.  11/26/19   [provider]  folic acid (FOLVITE) 1 MG tablet Take 1 mg by mouth daily. 11/20/19   [provider]  linaclotide Karlene Einstein) 145 MCG CAPS capsule Take 1 capsule (145 mcg total) by mouth daily before breakfast. 03/29/20   Elson Areas, PA-C  metoprolol succinate (TOPROL-XL) 25 MG 24 hr tablet Take 1 tablet (25 mg total) by mouth daily. 10/15/18   Mikhail, Nita Sells, DO  nystatin-triamcinolone ointment (MYCOLOG) Apply 1 application topically 2 (two) times daily as needed for rash. 09/23/19   [provider]  Oxycodone HCl 20 MG TABS Take 0.5 tablets (10 mg total) by mouth every  6 (six) hours as needed (severe pain). Patient taking differently: Take 20 mg by mouth in the morning, at noon, in the evening, and at bedtime.  03/27/20   Rhetta Mura, MD  phenytoin (DILANTIN) 100 MG ER capsule Take 100 mg by mouth 4 (four) times daily as needed. 05/04/20   [provider]  polyethylene glycol (MIRALAX / GLYCOLAX) 17 g packet Take 17 g by mouth daily. 03/28/20   Rhetta Mura, MD  predniSONE (DELTASONE) 5 MG tablet Take 1 tablet (5 mg total) by mouth daily with breakfast. 01/16/19   Glade Lloyd, MD  senna-docusate (SENOKOT-S) 8.6-50 MG tablet Take 1 tablet by mouth at bedtime. 03/27/20   Rhetta Mura, MD  valproic acid (DEPAKENE) 250 MG capsule Take 250 mg by mouth 3 (three) times daily.     [provider]    Allergies    Ivp dye [iodinated diagnostic agents] and Metrizamide  Review of Systems   Review of Systems  Constitutional: Positive for chills and fever.  Respiratory: Negative for shortness of breath.   Cardiovascular: Negative for chest pain.   Gastrointestinal: Positive for abdominal pain. Negative for constipation, diarrhea, nausea and vomiting.  Genitourinary: Positive for dysuria.  Musculoskeletal: Negative for arthralgias and myalgias.  Skin: Negative for rash and wound.  Psychiatric/Behavioral: Negative for confusion.  All other systems reviewed and are negative.   Physical Exam Updated Vital Signs BP 92/74   Pulse (!) 104   Temp (!) 100.4 F (38 C) (Rectal)   Resp (!) 22   Ht 5' (1.524 m)   Wt 54.4 kg   SpO2 99%   BMI 23.44 kg/m   Physical Exam Vitals and nursing note reviewed. Exam conducted with a chaperone present.  Constitutional:      General: She is not in acute distress.    Appearance: She is well-developed. She is not diaphoretic.  HENT:     Head: Normocephalic and atraumatic.  Cardiovascular:     Rate and Rhythm: Regular rhythm. Tachycardia present.     Pulses: Normal pulses.     Heart sounds: Normal heart sounds.  Pulmonary:     Effort: Pulmonary effort is normal.     Breath sounds: Normal breath sounds.  Abdominal:     Palpations: Abdomen is soft.     Tenderness: There is abdominal tenderness.  Genitourinary:    Comments: Soft yellow stools, pink tinged mucous which is hemoccult positive Musculoskeletal:        General: No swelling, tenderness or deformity.  Skin:    General: Skin is warm and dry.     Findings: No erythema or rash.  Neurological:     Mental Status: She is alert and oriented to person, place, and time.  Psychiatric:        Behavior: Behavior normal.     ED Results / Procedures / Treatments   Labs (all labs ordered are listed, but only abnormal results are displayed) Labs Reviewed  COMPREHENSIVE METABOLIC PANEL - Abnormal; Notable for the following components:      Result Value   Potassium 3.3 (*)    Chloride 112 (*)    CO2 15 (*)    Glucose, Bld 100 (*)    Creatinine, Ser 1.10 (*)    Calcium 8.0 (*)    Total Protein 5.7 (*)    Albumin 2.1 (*)    GFR calc  non Af Amer 54 (*)    All other components within normal limits  CBC - Abnormal; Notable for the  following components:   WBC 17.5 (*)    RBC 3.84 (*)    Hemoglobin 10.3 (*)    HCT 31.6 (*)    RDW 19.8 (*)    Platelets 87 (*)    All other components within normal limits  URINALYSIS, ROUTINE W REFLEX MICROSCOPIC - Abnormal; Notable for the following components:   APPearance HAZY (*)    Leukocytes,Ua LARGE (*)    WBC, UA >50 (*)    All other components within normal limits  PROTIME-INR - Abnormal; Notable for the following components:   Prothrombin Time 16.0 (*)    INR 1.3 (*)    All other components within normal limits  POC OCCULT BLOOD, ED - Abnormal; Notable for the following components:   Fecal Occult Bld POSITIVE (*)    All other components within normal limits  URINE CULTURE  CULTURE, BLOOD (ROUTINE X 2)  CULTURE, BLOOD (ROUTINE X 2)  CULTURE, BLOOD (SINGLE)  C DIFFICILE QUICK SCREEN W PCR REFLEX  RESPIRATORY PANEL BY RT PCR (FLU A&B, COVID)  LIPASE, BLOOD  LACTIC ACID, PLASMA  APTT  LACTIC ACID, PLASMA    EKG None  Radiology DG Chest Port 1 View  Result Date: 08/15/2020 CLINICAL DATA:  Sepsis. EXAM: PORTABLE CHEST 1 VIEW COMPARISON:  12/24/2019 FINDINGS: The cardiac silhouette, mediastinal and hilar contours are within normal limits and stable given the AP projection and portable technique. The lungs are clear of an acute process. No pleural effusions or pneumothorax. No pulmonary lesions. Stable thoracic fusion hardware and remote healed bilateral rib fractures. Stable advanced degenerative changes involving both shoulder joints. IMPRESSION: No acute cardiopulmonary findings. Electronically Signed   By: Rudie Meyer M.D.   On: 08/15/2020 14:43    Procedures .Critical Care Performed by: Jeannie Fend, PA-C Authorized by: Jeannie Fend, PA-C   Critical care provider statement:    Critical care time (minutes):  45   Critical care was time spent personally  by me on the following activities:  Discussions with consultants, evaluation of patient's response to treatment, examination of patient, ordering and performing treatments and interventions, ordering and review of laboratory studies, ordering and review of radiographic studies, pulse oximetry, re-evaluation of patient's condition, obtaining history from patient or surrogate and review of old charts   (including critical care time)  Medications Ordered in ED Medications  cefTRIAXone (ROCEPHIN) 1 g in sodium chloride 0.9 % 100 mL IVPB (has no administration in time range)  sodium chloride 0.9 % bolus 500 mL (has no administration in time range)    ED Course  I have reviewed the triage vital signs and the nursing notes.  Pertinent labs & imaging results that were available during my care of the patient were reviewed by me and considered in my medical decision making (see chart for details).  Clinical Course as of Aug 16 1523  Sun Aug 15, 2020  3246 64 year old female with abdominal pain, fever, dysuria.  On exam, mild generalized tenderness, stool soft/yellow with pink mucous, hemoccult positive. Sepsis order set initiated, IV fluids held due to patient reported history of CHF. BP 92/74, given bolus, mildly tachycardic likely related to fever (100.4 rectal). WBC elevated at 17.5, Rocephin ordered. Platelets low at 87, consistent with prior labs. CMP without significant findings. UA (cath) positive for large leukocytes, >50WBC, rare bacteria. Sent for culture.    [LM]  1509 CXR unremarkable. CT abdomen/pelvis ordered without contrast due to allergy. Care signed out pending CT.    [  LM]    Clinical Course User Index [LM] Alden Hipp   MDM Rules/Calculators/A&P                          Final Clinical Impression(s) / ED Diagnoses Final diagnoses:  Fever, unspecified  Sepsis without acute organ dysfunction, due to unspecified organism [facility])    Rx / DC Orders ED  Discharge Orders    None       Jeannie Fend, PA-C 08/15/20 1525    Geoffery Lyons, MD 08/18/20 1335

## 2020-08-15 NOTE — ED Provider Notes (Signed)
  Physical Exam  BP 93/71   Pulse (!) 103   Temp (!) 100.4 F (38 C) (Rectal)   Resp 17   Ht 5' (1.524 m)   Wt 54.4 kg   SpO2 100%   BMI 23.44 kg/m   Physical Exam  Gen: chronically ill appearing CV: mildly tachycardic Abd: diffuse ttp  ED Course/Procedures   Clinical Course as of Aug 15 1621  Sun Aug 15, 2020  518 64 year old female with abdominal pain, fever, dysuria.  On exam, mild generalized tenderness, stool soft/yellow with pink mucous, hemoccult positive. Sepsis order set initiated, IV fluids held due to patient reported history of CHF. BP 92/74, given bolus, mildly tachycardic likely related to fever (100.4 rectal). WBC elevated at 17.5, Rocephin ordered. Platelets low at 87, consistent with prior labs. CMP without significant findings. UA (cath) positive for large leukocytes, >50WBC, rare bacteria. Sent for culture.    [LM]  1509 CXR unremarkable. CT abdomen/pelvis ordered without contrast due to allergy. Care signed out pending CT.    [LM]    Clinical Course User Index [LM] Jeannie Fend, PA-C    Procedures  MDM   Patient signed out to me by L. Eulah Pont, PA-C.  Please see previous notes for further history.  In brief, patient is a chronically ill-appearing female presenting with abdominal pain, fever, dysuria.  On exam, patient with generalized abdominal tenderness with yellowish stool that is tinged with pink mucus.  Febrile and tachycardic with a soft blood pressure.  Leukocytosis of 17.  As such, fluids and antibiotics started.  Lactic pending.  Cathed urine shows many large leukocytes and greater than 50 white cells, however no bacteria.  However patient with urinary symptoms, ceftriaxone given to cover for possible urinary infection.  CT abdomen pelvis is pending.  Patient will likely need to be admitted due to sepsis after CT.  CT shows possible infectious versus rheumatoid colitis.  As such, will add on a second gram of Rocephin as well as Flagyl  to treat for intra-abdominal cause for possible sepsis.  Patient's lactic is overall reassuring.  There is difficulty obtaining access, as such patient still has not received fluids, however blood pressure maintaining in the low 90s.  She will need to be admitted for sepsis.  Discussed with Dr. Mikeal Hawthorne from [facility] hospitalist service, pt to be admitted.      Alveria Apley, PA-C 08/15/20 2028    Pollyann Savoy, MD 08/15/20 2123

## 2020-08-15 NOTE — H&P (Signed)
History and Physical   Woodrow Dulski VOZ:[id number] DOB: 10/03/1956 DOA: 08/15/2020  Referring MD/NP/PA: Dr. Karle Starch  PCP: Sandi Mariscal, MD   Patient coming from: Home  Chief Complaint: Fever chills diarrhea abdominal pain  HPI: Amber [name] is a 64 y.o. female with medical history significant of recurrent UTIs, CVA, diastolic CHF, asthma, diabetes, GERD, hypertension, seizure disorder and previous small bowel obstruction who presented to the ER with sudden onset of fever chills diarrhea and dysuria for the last 3 to 4 days.  She has had similar episodes in the past when she has had UTIs.  Patient also has reported duodenitis in the past.  She has nausea but no vomiting.  Patient has had previous sepsis.  She has no known sick contacts.  No contact with somebody with COVID-19.  Patient found to be having sepsis syndrome most likely due to recurrent UTI and possible colitis she is being admitted to the hospital for further evaluation and treatment.  She is fully awake noncommunicating..  ED Course: Temperature 100.4 blood pressure 150/88 pulse 140 respiratory 30 oxygen sat 99% on room air.  White count 17.5 hemoglobin 10.3 and platelets 87 sodium 139 potassium 3.3 chloride 112 CO2 50 BUN 10 creatinine 1.10 calcium 8.0 INR 1.3 and glucose 100.  Chest x-ray showed no acute findings.  CT abdomen pelvis showed suspected an inflammatory or infectious colitis.  Urinalysis shows hazy urine with a large leukocytes.  WBC more than 50.  Lactic acid is 1.5.  Patient being admitted with sepsis secondary to UTI and possible colitis.  Review of Systems: As per HPI otherwise 10 point review of systems negative.    Past Medical History:  Diagnosis Date  . ACS (acute coronary syndrome) ([place]) 06/12/2015  . Acute on chronic blood loss anemia 11/25/2017  . Arthritis   . Asthma   . Cerebrovascular accident (CVA) due to embolism ([place]) 10/26/2016  . CHF (congestive heart failure) ([place])   . Chronic pain   .  Constipation   . Diabetes mellitus without complication ([place])   . Duodenitis   . Embolic stroke ([place])   . Falls frequently   . Focal seizure ([place])   . GERD (gastroesophageal reflux disease)   . Hypertension   . Impacted fracture 02/15/2017   right glenoid  . Pneumonia 10/2015  . Seizures ([place])    last seizure March 2015  . Small bowel obstruction ([place]) 11/25/2017    Past Surgical History:  Procedure Laterality Date  . APPENDECTOMY    . BACK SURGERY    . CHOLECYSTECTOMY    . TEE WITHOUT CARDIOVERSION N/A 10/30/2016   Procedure: TRANSESOPHAGEAL ECHOCARDIOGRAM (TEE);  Surgeon: Dorothy Spark, MD;  Location: [facility] ENDOSCOPY;  Service: Cardiovascular;  Laterality: N/A;     reports that she quit smoking about 11 years ago. She has never used smokeless tobacco. She reports previous alcohol use. She reports that she does not use drugs.  Allergies  Allergen Reactions  . Ivp Dye [Iodinated Diagnostic Agents] Itching  . Metrizamide Itching    Family History  Problem Relation Age of Onset  . Kidney failure Mother   . Hypertension Sister   . Hypertension Brother      Prior to Admission medications   Medication Sig Start Date End Date Taking? Authorizing Provider  acetaminophen (TYLENOL) 500 MG tablet Take 2 tablets (1,000 mg total) by mouth every 8 (eight) hours. Patient taking differently: Take 1,000 mg by mouth every 8 (eight) hours as needed for mild pain or  moderate pain.  12/30/19  Yes Mercy Riding, MD  albuterol (PROVENTIL HFA;VENTOLIN HFA) 108 (90 Base) MCG/ACT inhaler Inhale 2 puffs into the lungs every 4 (four) hours as needed for wheezing or shortness of breath. 01/16/19  Yes Aline August, MD  allopurinol (ZYLOPRIM) 300 MG tablet Take 300 mg by mouth daily. 11/11/16  Yes [provider]  amLODipine (NORVASC) 5 MG tablet Take 10 mg by mouth daily.    Yes [provider]  apixaban (ELIQUIS) 5 MG TABS tablet Take 1 tablet (5 mg total) by mouth 2 (two) times  daily. 02/14/20  Yes Eulogio Bear U, DO  atorvastatin (LIPITOR) 10 MG tablet Take 10 mg by mouth daily at 6 PM.  08/13/15  Yes [provider]  calcium-vitamin D (OSCAL WITH D) 500-200 MG-UNIT tablet Take 2 tablets by mouth daily with breakfast. 12/30/19  Yes Gonfa, Taye T, MD  CREON 24000-76000 units CPEP Take 1 capsule by mouth 3 (three) times daily.  09/23/19  Yes [provider]  esomeprazole (NEXIUM) 40 MG capsule Take 40 mg by mouth 2 (two) times daily before a meal.  11/26/19  Yes [provider]  folic acid (FOLVITE) 1 MG tablet Take 1 mg by mouth daily. 11/20/19  Yes [provider]  linaclotide (LINZESS) 145 MCG CAPS capsule Take 1 capsule (145 mcg total) by mouth daily before breakfast. Patient taking differently: Take 145 mcg by mouth daily as needed.  03/29/20  Yes Caryl Ada K, PA-C  metoprolol succinate (TOPROL-XL) 25 MG 24 hr tablet Take 1 tablet (25 mg total) by mouth daily. 10/15/18  Yes Mikhail, [place], DO  nystatin-triamcinolone ointment (MYCOLOG) Apply 1 application topically 2 (two) times daily as needed for rash. 09/23/19  Yes [provider]  Oxycodone HCl 20 MG TABS Take 0.5 tablets (10 mg total) by mouth every 6 (six) hours as needed (severe pain). Patient taking differently: Take 20 mg by mouth every 6 (six) hours.  03/27/20  Yes Nita Sells, MD  polyethylene glycol (MIRALAX / GLYCOLAX) 17 g packet Take 17 g by mouth daily. Patient taking differently: Take 17 g by mouth daily as needed for mild constipation.  03/28/20  Yes Nita Sells, MD  predniSONE (DELTASONE) 5 MG tablet Take 1 tablet (5 mg total) by mouth daily with breakfast. 01/16/19  Yes Aline August, MD  valproic acid (DEPAKENE) 250 MG capsule Take 250 mg by mouth 3 (three) times daily.    Yes [provider]  senna-docusate (SENOKOT-S) 8.6-50 MG tablet Take 1 tablet by mouth at bedtime. Patient not taking: Reported on 08/15/2020 03/27/20   Nita Sells, MD    Physical Exam: Vitals:   08/15/20 2030 08/15/20 2100 08/15/20 2200 08/15/20 2230  BP: 92/71 107/85 111/75 102/83  Pulse: 95 (!) 102 100 (!) 108  Resp: 19 (!) 22 (!) 22 15  Temp:      TempSrc:      SpO2: 100% 100% 100% 100%  Weight:      Height:          Constitutional: Acutely ill looking, no distress Vitals:   08/15/20 2030 08/15/20 2100 08/15/20 2200 08/15/20 2230  BP: 92/71 107/85 111/75 102/83  Pulse: 95 (!) 102 100 (!) 108  Resp: 19 (!) 22 (!) 22 15  Temp:      TempSrc:      SpO2: 100% 100% 100% 100%  Weight:      Height:       Eyes: PERRL, lids and conjunctivae  normal ENMT: Mucous membranes are dry. Posterior pharynx clear of any exudate or lesions.Normal dentition.  Neck: normal, supple, no masses, no thyromegaly Respiratory: clear to auscultation bilaterally, no wheezing, no crackles. Normal respiratory effort. No accessory muscle use.  Cardiovascular: Sinus tachycardia, no murmurs / rubs / gallops. No extremity edema. 2+ pedal pulses. No carotid bruits.  Abdomen: Diffuse tenderness, no masses palpated. No hepatosplenomegaly. Bowel sounds positive.  Musculoskeletal: no clubbing / cyanosis. No joint deformity upper and lower extremities. Good ROM, no contractures. Normal muscle tone.  Skin: no rashes, lesions, ulcers. No induration Neurologic: CN 2-12 grossly intact. Sensation intact, DTR normal. Strength 5/5 in all 4.  Psychiatric:  slightly confused but awake.  She is alert and oriented x2    Labs on Admission: I have personally reviewed following labs and imaging studies  CBC: Recent Labs  Lab 08/15/20 1252  WBC 17.5*  HGB 10.3*  HCT 31.6*  MCV 82.3  PLT 87*   Basic Metabolic Panel: Recent Labs  Lab 08/15/20 1252  NA 139  K 3.3*  CL 112*  CO2 15*  GLUCOSE 100*  BUN 10  CREATININE 1.10*  CALCIUM 8.0*   GFR: Estimated Creatinine Clearance: 38.1 mL/min (A) (by C-G formula based on SCr of 1.1 mg/dL (H)). Liver Function  Tests: Recent Labs  Lab 08/15/20 1252  AST 23  ALT 11  ALKPHOS 99  BILITOT 0.6  PROT 5.7*  ALBUMIN 2.1*   Recent Labs  Lab 08/15/20 1252  LIPASE 22   No results for input(s): AMMONIA in the last 168 hours. Coagulation Profile: Recent Labs  Lab 08/15/20 1419  INR 1.3*   Cardiac Enzymes: No results for input(s): CKTOTAL, CKMB, CKMBINDEX, TROPONINI in the last 168 hours. BNP (last 3 results) No results for input(s): PROBNP in the last 8760 hours. HbA1C: No results for input(s): HGBA1C in the last 72 hours. CBG: No results for input(s): GLUCAP in the last 168 hours. Lipid Profile: No results for input(s): CHOL, HDL, LDLCALC, TRIG, CHOLHDL, LDLDIRECT in the last 72 hours. Thyroid Function Tests: No results for input(s): TSH, T4TOTAL, FREET4, T3FREE, THYROIDAB in the last 72 hours. Anemia Panel: No results for input(s): VITAMINB12, FOLATE, FERRITIN, TIBC, IRON, RETICCTPCT in the last 72 hours. Urine analysis:    Component Value Date/Time   COLORURINE YELLOW 08/15/2020 1417   APPEARANCEUR HAZY (A) 08/15/2020 1417   LABSPEC 1.015 08/15/2020 1417   PHURINE 6.0 08/15/2020 1417   GLUCOSEU NEGATIVE 08/15/2020 1417   HGBUR NEGATIVE 08/15/2020 1417   BILIRUBINUR NEGATIVE 08/15/2020 1417   KETONESUR NEGATIVE 08/15/2020 1417   PROTEINUR NEGATIVE 08/15/2020 1417   UROBILINOGEN 1.0 07/27/2015 1052   NITRITE NEGATIVE 08/15/2020 1417   LEUKOCYTESUR LARGE (A) 08/15/2020 1417   Sepsis Labs: $[email or web address]'[id number]$ (procalcitonin:4,lacticidven:4) ) Recent Results (from the past 240 hour(s))  Respiratory Panel by RT PCR (Flu A&B, Covid) - Urine, Catheterized     Status: None   Collection Time: 08/15/20  2:19 PM   Specimen: Urine, Catheterized; Nasopharyngeal  Result Value Ref Range Status   SARS Coronavirus 2 by RT PCR NEGATIVE NEGATIVE Final    Comment: (NOTE) SARS-CoV-2 target nucleic acids are NOT DETECTED.  The SARS-CoV-2 RNA is generally detectable in upper respiratoy specimens  during the acute phase of infection. The lowest concentration of SARS-CoV-2 viral copies this assay can detect is 131 copies/mL. A negative result does not preclude SARS-Cov-2 infection and should not be used as the sole basis for treatment or other patient management decisions. A negative result may  occur with  improper specimen collection/handling, submission of specimen other than nasopharyngeal swab, presence of viral mutation(s) within the areas targeted by this assay, and inadequate number of viral copies (<131 copies/mL). A negative result must be combined with clinical observations, patient history, and epidemiological information. The expected result is Negative.  Fact Sheet for Patients:  PinkCheek.be  Fact Sheet for Healthcare Providers:  GravelBags.it  This test is no t yet approved or cleared by the [place] FDA and  has been authorized for detection and/or diagnosis of SARS-CoV-2 by FDA under an Emergency Use Authorization (EUA). This EUA will remain  in effect (meaning this test can be used) for the duration of the COVID-19 declaration under Section 564(b)(1) of the Act, 21 U.S.C. section 360bbb-3(b)(1), unless the authorization is terminated or revoked sooner.     Influenza A by PCR NEGATIVE NEGATIVE Final   Influenza B by PCR NEGATIVE NEGATIVE Final    Comment: (NOTE) The Xpert Xpress SARS-CoV-2/FLU/RSV assay is intended as an aid in  the diagnosis of influenza from Nasopharyngeal swab specimens and  should not be used as a sole basis for treatment. Nasal washings and  aspirates are unacceptable for Xpert Xpress SARS-CoV-2/FLU/RSV  testing.  Fact Sheet for Patients: PinkCheek.be  Fact Sheet for Healthcare Providers: GravelBags.it  This test is not yet approved or cleared by the [place] FDA and  has been authorized for detection and/or  diagnosis of SARS-CoV-2 by  FDA under an Emergency Use Authorization (EUA). This EUA will remain  in effect (meaning this test can be used) for the duration of the  Covid-19 declaration under Section 564(b)(1) of the Act, 21  U.S.C. section 360bbb-3(b)(1), unless the authorization is  terminated or revoked. Performed at [facility], [place] [address], [place], [place] [address]      Radiological Exams on Admission: CT Abdomen Pelvis Wo Contrast  Result Date: 08/15/2020 CLINICAL DATA:  Abdominal pain, fever and generalized body aches. Diarrhea. EXAM: CT ABDOMEN AND PELVIS WITHOUT CONTRAST TECHNIQUE: Multidetector CT imaging of the abdomen and pelvis was performed following the standard protocol without IV contrast. COMPARISON:  03/24/2020 FINDINGS: Lower chest: The lung bases are clear of an acute process. Chronic lung changes are noted. The heart is normal in size. Prominent pericardial fat. The distal esophagus is grossly normal. Hepatobiliary: No hepatic lesions are identified without contrast. The gallbladder is surgically absent. No biliary dilatation. Pancreas: Stable advanced pancreatic atrophy and diffuse calcifications consistent with chronic calcific pancreatitis. No acute inflammatory changes. Spleen: Normal size.  No focal lesions. Adrenals/Urinary Tract: The adrenal glands and kidneys are unremarkable. No renal lesions, hydronephrosis or obstructing ureteral calculi. The bladder is unremarkable. Stomach/Bowel: The stomach, duodenum and small bowel are unremarkable. No acute inflammatory changes, mass lesions or obstructive findings. Suspect inflammation of the ascending, transverse and descending colon suggesting an inflammatory or infectious colitis. Scattered colonic diverticuli but no evidence of acute diverticulitis. The sigmoid colon and rectum are grossly normal. Vascular/Lymphatic: Advanced vascular calcifications but no aneurysm. No mesenteric or retroperitoneal mass or  adenopathy. Reproductive: The uterus and ovaries are stable. Extensive calcifications are noted. Other: No pelvic mass or adenopathy. No free pelvic fluid collections. No inguinal mass or adenopathy. No abdominal wall hernia or subcutaneous lesions. Musculoskeletal: Advanced fatty atrophy of the hip and pelvic musculature. No acute bony findings. Osteoporosis and remote fractures involving T12, L1 and L3. IMPRESSION: 1. Suspect an inflammatory or infectious colitis. 2. No other significant abdominal/pelvic findings, mass lesions or adenopathy. 3.  Stable advanced pancreatic atrophy and diffuse calcifications consistent with chronic calcific pancreatitis. 4. Status post cholecystectomy. No biliary dilatation. 5. Aortic atherosclerosis. Aortic Atherosclerosis (ICD10-I70.0). Electronically Signed   By: Marijo Sanes M.D.   On: 08/15/2020 16:05   DG Chest Port 1 View  Result Date: 08/15/2020 CLINICAL DATA:  Sepsis. EXAM: PORTABLE CHEST 1 VIEW COMPARISON:  12/24/2019 FINDINGS: The cardiac silhouette, mediastinal and hilar contours are within normal limits and stable given the AP projection and portable technique. The lungs are clear of an acute process. No pleural effusions or pneumothorax. No pulmonary lesions. Stable thoracic fusion hardware and remote healed bilateral rib fractures. Stable advanced degenerative changes involving both shoulder joints. IMPRESSION: No acute cardiopulmonary findings. Electronically Signed   By: Marijo Sanes M.D.   On: 08/15/2020 14:43      Assessment/Plan Principal Problem:   Sepsis due to gram-negative UTI [facility]) Active Problems:   History of seizure   Diabetes mellitus type 2, controlled (HCC)   Failure to thrive in adult   Chronic diastolic (congestive) heart failure (HCC)   CKD (chronic kidney disease) stage 3, GFR 30-59 ml/min   Chronic anticoagulation   UTI (urinary tract infection)   Colitis, acute     #1 sepsis: Patient has met sepsis criteria.  Source of  infection appears to be UTI.  Could possibly also have enteritis and colitis.  At this point will admit the patient.  IV resuscitation per sepsis protocol.  IV antibiotics with ceftriaxone and metronidazole to treat both enteritis as well as UTI.  Urine cultures and blood cultures to be obtained follow lactate level.  #2 diabetes: Sliding scale insulin with home regimen.  #3 UTI: As per above.  #4 enteritis versus colitis: If diarrhea persists will check for C. difficile.  Continue monitoring.  #5 chronic diastolic heart failure: Continue to monitor closely.  #6 chronic kidney disease stage III: Continue close monitoring.  #7 history of CVA: Stable.  Continue home regimen.  #8 history of seizure disorder: We will continue home regimen.  #9 hypokalemia: We will replete potassium.   DVT prophylaxis: Eliquis Code Status: Full code Family Communication: Daughter at bedside Disposition Plan: Home Consults called: None Admission status: Inpatient  Severity of Illness: The appropriate patient status for this patient is INPATIENT. Inpatient status is judged to be reasonable and necessary in order to provide the required intensity of service to ensure the patient's safety. The patient's presenting symptoms, physical exam findings, and initial radiographic and laboratory data in the context of their chronic comorbidities is felt to place them at high risk for further clinical deterioration. Furthermore, it is not anticipated that the patient will be medically stable for discharge from the hospital within 2 midnights of admission. The following factors support the patient status of inpatient.   " The patient's presenting symptoms include fever chills abdominal pain. " The worrisome physical exam findings include abdominal tenderness. " The initial radiographic and laboratory data are worrisome because of evidence of sepsis. " The chronic co-morbidities include recurrent UTI and chronic  anticoagulation.   * I certify that at the point of admission it is my clinical judgment that the patient will require inpatient hospital care spanning beyond 2 midnights from the point of admission due to high intensity of service, high risk for further deterioration and high frequency of surveillance required.Barbette Merino MD [facility] Pager [phone]  If 7PM-7AM, please contact night-coverage [email or web address] Password [email or web address]  08/16/2020, 1:06 AM

## 2020-08-15 NOTE — ED Triage Notes (Signed)
Patient arrives to ED via EMS with complaints of generalized body aches, diarrhea, pain on urination, chills, and fever for the past 3-4 days.

## 2020-08-16 DIAGNOSIS — K529 Noninfective gastroenteritis and colitis, unspecified: Secondary | ICD-10-CM | POA: Diagnosis present

## 2020-08-16 LAB — COMPREHENSIVE METABOLIC PANEL
ALT: 11 U/L (ref 0–44)
AST: 21 U/L (ref 15–41)
Albumin: 1.7 g/dL — ABNORMAL LOW (ref 3.5–5.0)
Alkaline Phosphatase: 82 U/L (ref 38–126)
Anion gap: 8 (ref 5–15)
BUN: 9 mg/dL (ref 8–23)
CO2: 16 mmol/L — ABNORMAL LOW (ref 22–32)
Calcium: 7.7 mg/dL — ABNORMAL LOW (ref 8.9–10.3)
Chloride: 117 mmol/L — ABNORMAL HIGH (ref 98–111)
Creatinine, Ser: 0.91 mg/dL (ref 0.44–1.00)
GFR calc Af Amer: >60 mL/min (ref >60)
GFR calc non Af Amer: >60 mL/min (ref >60)
Glucose, Bld: 65 mg/dL — ABNORMAL LOW (ref 70–99)
Potassium: 2.9 mmol/L — ABNORMAL LOW (ref 3.5–5.1)
Sodium: 141 mmol/L (ref 135–145)
Total Bilirubin: 0.8 mg/dL (ref 0.3–1.2)
Total Protein: 4.7 g/dL — ABNORMAL LOW (ref 6.5–8.1)

## 2020-08-16 LAB — PROTIME-INR
INR: 1.7 — ABNORMAL HIGH (ref 0.8–1.2)
Prothrombin Time: 19.3 seconds — ABNORMAL HIGH (ref 11.4–15.2)

## 2020-08-16 LAB — CBG MONITORING, ED
Glucose-Capillary: 67 mg/dL — ABNORMAL LOW (ref 70–99)
Glucose-Capillary: 75 mg/dL (ref 70–99)
Glucose-Capillary: 104 mg/dL — ABNORMAL HIGH (ref 70–99)

## 2020-08-16 LAB — CBC
HCT: 26.5 % — ABNORMAL LOW (ref 36.0–46.0)
Hemoglobin: 8.4 g/dL — ABNORMAL LOW (ref 12.0–15.0)
MCH: 26.4 pg (ref 26.0–34.0)
MCHC: 31.7 g/dL (ref 30.0–36.0)
MCV: 83.3 fL (ref 80.0–100.0)
Platelets: 73 10*3/uL — ABNORMAL LOW (ref 150–400)
RBC: 3.18 MIL/uL — ABNORMAL LOW (ref 3.87–5.11)
RDW: 19.7 % — ABNORMAL HIGH (ref 11.5–15.5)
WBC: 10.5 10*3/uL (ref 4.0–10.5)
nRBC: 0 % (ref 0.0–0.2)

## 2020-08-16 LAB — URINE CULTURE: Culture: NO GROWTH

## 2020-08-16 LAB — GLUCOSE, CAPILLARY
Glucose-Capillary: 227 mg/dL — ABNORMAL HIGH (ref 70–99)
Glucose-Capillary: 110 mg/dL — ABNORMAL HIGH (ref 70–99)

## 2020-08-16 LAB — PROCALCITONIN: Procalcitonin: 0.1 ng/mL

## 2020-08-16 LAB — CORTISOL-AM, BLOOD: Cortisol - AM: 8.4 ug/dL (ref 6.7–22.6)

## 2020-08-16 MED ORDER — INSULIN ASPART 100 UNIT/ML ~~LOC~~ SOLN
0.0000 [IU] | Freq: Three times a day (TID) | SUBCUTANEOUS | Status: DC
  Administered 2020-08-16 – 2020-08-17 (×2): 3 [IU] via SUBCUTANEOUS
  Administered 2020-08-18: 1 [IU] via SUBCUTANEOUS

## 2020-08-16 MED ORDER — FOLIC ACID 1 MG PO TABS
1.0000 mg | ORAL_TABLET | Freq: Every day | ORAL | Status: DC
  Administered 2020-08-16 – 2020-08-20 (×5): 1 mg via ORAL
  Filled 2020-08-16 (×5): qty 1

## 2020-08-16 MED ORDER — ALBUTEROL SULFATE (2.5 MG/3ML) 0.083% IN NEBU: 3.0000 mL | INHALATION_SOLUTION | RESPIRATORY_TRACT | Status: DC | PRN

## 2020-08-16 MED ORDER — AMLODIPINE BESYLATE 10 MG PO TABS
10.0000 mg | ORAL_TABLET | Freq: Every day | ORAL | Status: DC
  Administered 2020-08-16 – 2020-08-18 (×3): 10 mg via ORAL
  Filled 2020-08-16 (×2): qty 1
  Filled 2020-08-16: qty 2
  Filled 2020-08-16 (×2): qty 1

## 2020-08-16 MED ORDER — INSULIN ASPART 100 UNIT/ML ~~LOC~~ SOLN: 0.0000 [IU] | Freq: Every day | SUBCUTANEOUS | Status: DC

## 2020-08-16 MED ORDER — PANTOPRAZOLE SODIUM 40 MG PO TBEC
40.0000 mg | DELAYED_RELEASE_TABLET | Freq: Every day | ORAL | Status: DC
  Administered 2020-08-16 – 2020-08-20 (×5): 40 mg via ORAL
  Filled 2020-08-16 (×5): qty 1

## 2020-08-16 MED ORDER — METOPROLOL SUCCINATE ER 25 MG PO TB24
25.0000 mg | ORAL_TABLET | Freq: Every day | ORAL | Status: DC
  Administered 2020-08-16 – 2020-08-20 (×4): 25 mg via ORAL
  Filled 2020-08-16 (×5): qty 1

## 2020-08-16 MED ORDER — ATORVASTATIN CALCIUM 10 MG PO TABS
10.0000 mg | ORAL_TABLET | Freq: Every day | ORAL | Status: DC
  Administered 2020-08-16 – 2020-08-19 (×4): 10 mg via ORAL
  Filled 2020-08-16 (×4): qty 1

## 2020-08-16 MED ORDER — PREDNISONE 5 MG PO TABS
5.0000 mg | ORAL_TABLET | Freq: Every day | ORAL | Status: DC
  Administered 2020-08-17 – 2020-08-20 (×4): 5 mg via ORAL
  Filled 2020-08-16 (×4): qty 1

## 2020-08-16 MED ORDER — PANCRELIPASE (LIP-PROT-AMYL) 12000-38000 UNITS PO CPEP
12000.0000 [IU] | ORAL_CAPSULE | Freq: Three times a day (TID) | ORAL | Status: DC
  Administered 2020-08-16 – 2020-08-20 (×12): 12000 [IU] via ORAL
  Filled 2020-08-16 (×14): qty 1

## 2020-08-16 MED ORDER — VALPROIC ACID 250 MG PO CAPS
250.0000 mg | ORAL_CAPSULE | Freq: Three times a day (TID) | ORAL | Status: DC
  Administered 2020-08-16 – 2020-08-20 (×13): 250 mg via ORAL
  Filled 2020-08-16 (×15): qty 1

## 2020-08-16 NOTE — ED Notes (Signed)
[phone], Son [name] would like an update

## 2020-08-16 NOTE — ED Notes (Signed)
Report given to Andrea, RN (5M). °

## 2020-08-16 NOTE — ED Notes (Signed)
Attempted to call report and nurse she me she will call back when done giving meds to a pt.

## 2020-08-16 NOTE — ED Notes (Signed)
Pt CBG 67. Pt alert; given sprite, broth, and crackers. Will recheck CBG after oral intake.

## 2020-08-16 NOTE — Progress Notes (Addendum)
PROGRESS NOTE    Amber [name]  HYW:[id number] DOB: 24-Apr-1956 DOA: 08/15/2020 PCP: Sandi Mariscal, MD   Brief Narrative:  HPI: Amber [name] is a 64 y.o. female with medical history significant of recurrent UTIs, CVA, diastolic CHF, asthma, diabetes, GERD, hypertension, seizure disorder and previous small bowel obstruction who presented to the ER with sudden onset of fever chills diarrhea and dysuria for the last 3 to 4 days.  She has had similar episodes in the past when she has had UTIs.  Patient also has reported duodenitis in the past.  She has nausea but no vomiting.  Patient has had previous sepsis.  She has no known sick contacts.  No contact with somebody with COVID-19.  Patient found to be having sepsis syndrome most likely due to recurrent UTI and possible colitis she is being admitted to the hospital for further evaluation and treatment.  She is fully awake noncommunicating..  ED Course: Temperature 100.4 blood pressure 150/88 pulse 140 respiratory 30 oxygen sat 99% on room air.  White count 17.5 hemoglobin 10.3 and platelets 87 sodium 139 potassium 3.3 chloride 112 CO2 50 BUN 10 creatinine 1.10 calcium 8.0 INR 1.3 and glucose 100.  Chest x-ray showed no acute findings.  CT abdomen pelvis showed suspected an inflammatory or infectious colitis.  Urinalysis shows hazy urine with a large leukocytes.  WBC more than 50.  Lactic acid is 1.5.  Patient being admitted with sepsis secondary to UTI and possible colitis.  Assessment & Plan:   Principal Problem:   Sepsis due to gram-negative UTI ([place]) Active Problems:   History of seizure   Diabetes mellitus type 2, controlled ([place])   Failure to thrive in adult   Chronic diastolic (congestive) heart failure (HCC)   CKD (chronic kidney disease) stage 3, GFR 30-59 ml/min   Chronic anticoagulation   UTI (urinary tract infection)   Colitis, acute   #1 sepsis secondary to acute colitis: Patient met sepsis criteria based on tachypnea, tachycardia  and leukocytosis of 7.5.  Source of infection acute colitis based on the CT scan.  Patient was having diarrhea prior to coming however she tells me that now she is constipated.  She has not had any bowel movement since admission.  Wonder if she has C. difficile.  C. difficile was ordered but awaiting patient to provide sample.  We will continue Rocephin and Flagyl in the meantime and follow cultures.  Continue gentle IV hydration.  #2 diabetes mellitus type II: Last hemoglobin A1c 4.4 about 5 months ago.  Checking hemoglobin A1c today.  Sliding scale insulin with home regimen.  #3 history of CVA: Stable.  Continue home regimen.  #4 history of seizure disorder: We will continue home regimen.  #5 chronic diastolic heart failure: Continue to monitor closely.  #6  Acute kidney injury: Likely due to dehydration.  Resolved.  Continue gentle hydration.  #7  Hypokalemia: 2.9 again today.  Will replace today and recheck in the morning.  #8 chronic thrombocytopenia: Slightly lower than baseline.  No signs of bleeding.  Monitor.  #9.  Asymptomatic bacteriuria: Patient does not have any urinary complaints and her UA is not overly consistent with UTI.  Does not need treatment.  DVT prophylaxis:    Code Status: Full Code  Family Communication:  None present at bedside.  Plan of care discussed with patient in length and he verbalized understanding and agreed with it.  Status is: Inpatient  Remains inpatient appropriate because:Inpatient level of care appropriate due to severity of  illness   Dispo: The patient is from: Home              Anticipated d/c is to: Home              Anticipated d/c date is: 2 days              Patient currently is not medically stable to d/c.        Estimated body mass index is 23.44 kg/m as calculated from the following:   Height as of this encounter: 5' (1.524 m).   Weight as of this encounter: 54.4 kg.  Pressure Injury 10/10/18 Stage II -  Partial  thickness loss of dermis presenting as a shallow open ulcer with a red, pink wound bed without slough. (Active)  10/10/18 2230  Location: Groin  Location Orientation: Left  Staging: Stage II -  Partial thickness loss of dermis presenting as a shallow open ulcer with a red, pink wound bed without slough.  Wound Description (Comments):   Present on Admission: Yes     Pressure Injury 10/10/18 Stage II -  Partial thickness loss of dermis presenting as a shallow open ulcer with a red, pink wound bed without slough. (Active)  10/10/18 2230  Location: Sacrum  Location Orientation: Mid  Staging: Stage II -  Partial thickness loss of dermis presenting as a shallow open ulcer with a red, pink wound bed without slough.  Wound Description (Comments):   Present on Admission: Yes     Pressure Injury 10/10/18 Stage II -  Partial thickness loss of dermis presenting as a shallow open ulcer with a red, pink wound bed without slough. (Active)  10/10/18 2230  Location: Buttocks  Location Orientation: Right  Staging: Stage II -  Partial thickness loss of dermis presenting as a shallow open ulcer with a red, pink wound bed without slough.  Wound Description (Comments):   Present on Admission: Yes     Pressure Injury 10/10/18 Stage II -  Partial thickness loss of dermis presenting as a shallow open ulcer with a red, pink wound bed without slough. (Active)  10/10/18 2230  Location: Buttocks  Location Orientation: Left  Staging: Stage II -  Partial thickness loss of dermis presenting as a shallow open ulcer with a red, pink wound bed without slough.  Wound Description (Comments):   Present on Admission: Yes     Pressure Injury 12/29/18 Stage I -  Intact skin with non-blanchable redness of a localized area usually over a bony prominence. (Active)  12/29/18 2040  Location: Sacrum  Location Orientation: Medial  Staging: Stage I -  Intact skin with non-blanchable redness of a localized area usually over a  bony prominence.  Wound Description (Comments):   Present on Admission: Yes     Nutritional status:               Consultants:   None  Procedures:   None  Antimicrobials:  Anti-infectives (From admission, onward)   Start     Dose/Rate Route Frequency Ordered Stop   08/15/20 2115  cefTRIAXone (ROCEPHIN) 2 g in sodium chloride 0.9 % 100 mL IVPB        2 g 200 mL/hr over 30 Minutes Intravenous Every 24 hours 08/15/20 2108     08/15/20 2115  metroNIDAZOLE (FLAGYL) IVPB 500 mg        500 mg 100 mL/hr over 60 Minutes Intravenous Every 8 hours 08/15/20 2108     08/15/20 1615  metroNIDAZOLE (FLAGYL)  IVPB 500 mg        500 mg 100 mL/hr over 60 Minutes Intravenous  Once 08/15/20 1611 08/15/20 2022   08/15/20 1615  cefTRIAXone (ROCEPHIN) 1 g in sodium chloride 0.9 % 100 mL IVPB        1 g 200 mL/hr over 30 Minutes Intravenous  Once 08/15/20 1611 08/15/20 1850   08/15/20 1500  cefTRIAXone (ROCEPHIN) 1 g in sodium chloride 0.9 % 100 mL IVPB        1 g 200 mL/hr over 30 Minutes Intravenous  Once 08/15/20 1455 08/15/20 1802         Subjective: Patient seen and examined.  She complains of pain all over the body.  She did not even mention her abdominal pain until I asked.  Before that, her major complaint was bilateral feet cramping and back pain which according to her is chronic as well.  She was tender to touch everywhere.  Objective: Vitals:   08/16/20 1130 08/16/20 1200 08/16/20 1217 08/16/20 1252  BP: 98/82 108/82  93/74  Pulse: 84 88  85  Resp: $Remo'17 19  18  'BrnsS$ Temp:   98.2 F (36.8 C) 97.8 F (36.6 C)  TempSrc:   Oral Oral  SpO2: 100% 100%  100%  Weight:      Height:        Intake/Output Summary (Last 24 hours) at 08/16/2020 1354 Last data filed at 08/15/2020 2036 Gross per 24 hour  Intake 801.66 ml  Output --  Net 801.66 ml   Filed Weights   08/15/20 1214  Weight: 54.4 kg    Examination:  General exam: Appears calm and comfortable but chronically  deconditioned Respiratory system: Clear to auscultation. Respiratory effort normal. Cardiovascular system: S1 & S2 heard, RRR. No JVD, murmurs, rubs, gallops or clicks. No pedal edema. Gastrointestinal system: Abdomen is nondistended, soft and diffusely tender, however she was tender to touch everywhere in her body. No organomegaly or masses felt. Normal bowel sounds heard. Central nervous system: Alert and oriented. No focal neurological deficits. Extremities: Symmetric 5 x 5 power. Skin: No rashes, lesions or ulcers  Data Reviewed: I have personally reviewed following labs and imaging studies  CBC: Recent Labs  Lab 08/15/20 1252 08/16/20 0754  WBC 17.5* 10.5  HGB 10.3* 8.4*  HCT 31.6* 26.5*  MCV 82.3 83.3  PLT 87* 73*   Basic Metabolic Panel: Recent Labs  Lab 08/15/20 1252 08/16/20 0754  NA 139 141  K 3.3* 2.9*  CL 112* 117*  CO2 15* 16*  GLUCOSE 100* 65*  BUN 10 9  CREATININE 1.10* 0.91  CALCIUM 8.0* 7.7*   GFR: Estimated Creatinine Clearance: 46 mL/min (by C-G formula based on SCr of 0.91 mg/dL). Liver Function Tests: Recent Labs  Lab 08/15/20 1252 08/16/20 0754  AST 23 21  ALT 11 11  ALKPHOS 99 82  BILITOT 0.6 0.8  PROT 5.7* 4.7*  ALBUMIN 2.1* 1.7*   Recent Labs  Lab 08/15/20 1252  LIPASE 22   No results for input(s): AMMONIA in the last 168 hours. Coagulation Profile: Recent Labs  Lab 08/15/20 1419 08/16/20 0754  INR 1.3* 1.7*   Cardiac Enzymes: No results for input(s): CKTOTAL, CKMB, CKMBINDEX, TROPONINI in the last 168 hours. BNP (last 3 results) No results for input(s): PROBNP in the last 8760 hours. HbA1C: No results for input(s): HGBA1C in the last 72 hours. CBG: Recent Labs  Lab 08/16/20 0434 08/16/20 0530 08/16/20 1205  GLUCAP 67* 75 104*   Lipid  Profile: No results for input(s): CHOL, HDL, LDLCALC, TRIG, CHOLHDL, LDLDIRECT in the last 72 hours. Thyroid Function Tests: No results for input(s): TSH, T4TOTAL, FREET4, T3FREE,  THYROIDAB in the last 72 hours. Anemia Panel: No results for input(s): VITAMINB12, FOLATE, FERRITIN, TIBC, IRON, RETICCTPCT in the last 72 hours. Sepsis Labs: Recent Labs  Lab 08/15/20 1419 08/16/20 0754  PROCALCITON  --  0.10  LATICACIDVEN 1.5  --     Recent Results (from the past 240 hour(s))  Urine culture     Status: None   Collection Time: 08/15/20  2:17 PM   Specimen: Urine, Random  Result Value Ref Range Status   Specimen Description URINE, RANDOM  Final   Special Requests NONE  Final   Culture   Final    NO GROWTH Performed at [facility], [address] [place], [place] [address]    Report Status 08/16/2020 FINAL  Final  Blood culture (routine single)     Status: None (Preliminary result)   Collection Time: 08/15/20  2:19 PM   Specimen: BLOOD LEFT HAND  Result Value Ref Range Status   Specimen Description BLOOD LEFT HAND  Final   Special Requests   Final    BOTTLES DRAWN AEROBIC AND ANAEROBIC Blood Culture results may not be optimal due to an inadequate volume of blood received in culture bottles   Culture   Final    NO GROWTH < 24 HOURS Performed at [facility], [place] [address], [place], [place] [address]    Report Status PENDING  Incomplete  Respiratory Panel by RT PCR (Flu A&B, Covid) - Urine, Catheterized     Status: None   Collection Time: 08/15/20  2:19 PM   Specimen: Urine, Catheterized; Nasopharyngeal  Result Value Ref Range Status   SARS Coronavirus 2 by RT PCR NEGATIVE NEGATIVE Final    Comment: (NOTE) SARS-CoV-2 target nucleic acids are NOT DETECTED.  The SARS-CoV-2 RNA is generally detectable in upper respiratoy specimens during the acute phase of infection. The lowest concentration of SARS-CoV-2 viral copies this assay can detect is 131 copies/mL. A negative result does not preclude SARS-Cov-2 infection and should not be used as the sole basis for treatment or other patient management decisions. A negative result may occur with    improper specimen collection/handling, submission of specimen other than nasopharyngeal swab, presence of viral mutation(s) within the areas targeted by this assay, and inadequate number of viral copies (<131 copies/mL). A negative result must be combined with clinical observations, patient history, and epidemiological information. The expected result is Negative.  Fact Sheet for Patients:  PinkCheek.be  Fact Sheet for Healthcare Providers:  GravelBags.it  This test is no t yet approved or cleared by the [place] FDA and  has been authorized for detection and/or diagnosis of SARS-CoV-2 by FDA under an Emergency Use Authorization (EUA). This EUA will remain  in effect (meaning this test can be used) for the duration of the COVID-19 declaration under Section 564(b)(1) of the Act, 21 U.S.C. section 360bbb-3(b)(1), unless the authorization is terminated or revoked sooner.     Influenza A by PCR NEGATIVE NEGATIVE Final   Influenza B by PCR NEGATIVE NEGATIVE Final    Comment: (NOTE) The Xpert Xpress SARS-CoV-2/FLU/RSV assay is intended as an aid in  the diagnosis of influenza from Nasopharyngeal swab specimens and  should not be used as a sole basis for treatment. Nasal washings and  aspirates are unacceptable for Xpert Xpress SARS-CoV-2/FLU/RSV  testing.  Fact Sheet for Patients: PinkCheek.be  Fact Sheet for Healthcare Providers: GravelBags.it  This test is not yet approved or cleared by the [place] FDA and  has been authorized for detection and/or diagnosis of SARS-CoV-2 by  FDA under an Emergency Use Authorization (EUA). This EUA will remain  in effect (meaning this test can be used) for the duration of the  Covid-19 declaration under Section 564(b)(1) of the Act, 21  U.S.C. section 360bbb-3(b)(1), unless the authorization is  terminated or  revoked. Performed at [facility], [place] [address], [place], [place] [address]   Culture, blood (routine x 2)     Status: None (Preliminary result)   Collection Time: 08/15/20  2:23 PM   Specimen: BLOOD  Result Value Ref Range Status   Specimen Description BLOOD LEFT ANTECUBITAL  Final   Special Requests   Final    BOTTLES DRAWN AEROBIC AND ANAEROBIC Blood Culture results may not be optimal due to an inadequate volume of blood received in culture bottles   Culture   Final    NO GROWTH < 24 HOURS Performed at [facility], [place] [address] [place],  [address]    Report Status PENDING  Incomplete      Radiology Studies: CT Abdomen Pelvis Wo Contrast  Result Date: 08/15/2020 CLINICAL DATA:  Abdominal pain, fever and generalized body aches. Diarrhea. EXAM: CT ABDOMEN AND PELVIS WITHOUT CONTRAST TECHNIQUE: Multidetector CT imaging of the abdomen and pelvis was performed following the standard protocol without IV contrast. COMPARISON:  03/24/2020 FINDINGS: Lower chest: The lung bases are clear of an acute process. Chronic lung changes are noted. The heart is normal in size. Prominent pericardial fat. The distal esophagus is grossly normal. Hepatobiliary: No hepatic lesions are identified without contrast. The gallbladder is surgically absent. No biliary dilatation. Pancreas: Stable advanced pancreatic atrophy and diffuse calcifications consistent with chronic calcific pancreatitis. No acute inflammatory changes. Spleen: Normal size.  No focal lesions. Adrenals/Urinary Tract: The adrenal glands and kidneys are unremarkable. No renal lesions, hydronephrosis or obstructing ureteral calculi. The bladder is unremarkable. Stomach/Bowel: The stomach, duodenum and small bowel are unremarkable. No acute inflammatory changes, mass lesions or obstructive findings. Suspect inflammation of the ascending, transverse and descending colon suggesting an inflammatory or infectious colitis. Scattered  colonic diverticuli but no evidence of acute diverticulitis. The sigmoid colon and rectum are grossly normal. Vascular/Lymphatic: Advanced vascular calcifications but no aneurysm. No mesenteric or retroperitoneal mass or adenopathy. Reproductive: The uterus and ovaries are stable. Extensive calcifications are noted. Other: No pelvic mass or adenopathy. No free pelvic fluid collections. No inguinal mass or adenopathy. No abdominal wall hernia or subcutaneous lesions. Musculoskeletal: Advanced fatty atrophy of the hip and pelvic musculature. No acute bony findings. Osteoporosis and remote fractures involving T12, L1 and L3. IMPRESSION: 1. Suspect an inflammatory or infectious colitis. 2. No other significant abdominal/pelvic findings, mass lesions or adenopathy. 3. Stable advanced pancreatic atrophy and diffuse calcifications consistent with chronic calcific pancreatitis. 4. Status post cholecystectomy. No biliary dilatation. 5. Aortic atherosclerosis. Aortic Atherosclerosis (ICD10-I70.0). Electronically Signed   By: Marijo Sanes M.D.   On: 08/15/2020 16:05   DG Chest Port 1 View  Result Date: 08/15/2020 CLINICAL DATA:  Sepsis. EXAM: PORTABLE CHEST 1 VIEW COMPARISON:  12/24/2019 FINDINGS: The cardiac silhouette, mediastinal and hilar contours are within normal limits and stable given the AP projection and portable technique. The lungs are clear of an acute process. No pleural effusions or pneumothorax. No pulmonary lesions. Stable thoracic  fusion hardware and remote healed bilateral rib fractures. Stable advanced degenerative changes involving both shoulder joints. IMPRESSION: No acute cardiopulmonary findings. Electronically Signed   By: Marijo Sanes M.D.   On: 08/15/2020 14:43    Scheduled Meds: . amLODipine  10 mg Oral Daily  . apixaban  5 mg Oral BID  . atorvastatin  10 mg Oral q1800  . folic acid  1 mg Oral Daily  . insulin aspart  0-5 Units Subcutaneous QHS  . insulin aspart  0-9 Units  Subcutaneous TID WC  . lipase/protease/amylase  12,000 Units Oral TID AC  . metoprolol succinate  25 mg Oral Daily  . pantoprazole  40 mg Oral Daily  . [START ON 08/17/2020] predniSONE  5 mg Oral Q breakfast  . valproic acid  250 mg Oral TID   Continuous Infusions: . cefTRIAXone (ROCEPHIN)  IV    . lactated ringers 75 mL/hr at 08/15/20 2214  . metronidazole 500 mg (08/16/20 1325)     LOS: 1 day   Time spent:    Darliss Cheney, MD [facility]  08/16/2020, 1:54 PM   To contact the attending provider between 7A-7P or the covering provider during after hours 7P-7A, please log into the web site [email or web address].

## 2020-08-16 NOTE — Progress Notes (Signed)
NEW ADMISSION NOTE New Admission Note:   Arrival Method: Bed Mental Orientation: AAOx4 Telemetry: Box 17 Assessment: Completed Skin: Intact IV: 22G Left hand Pain: 0 on 0-10 scale Tubes: n/a Safety Measures: Safety Fall Prevention Plan has been given, discussed and signed Admission: Completed [facility] Orientation: Patient has been orientated to the room, unit and staff.  Family: n/a Orders have been reviewed and implemented. Will continue to monitor the patient. Call light has been placed within reach and bed alarm has been activated.   Myrtis Hopping, RN

## 2020-08-17 LAB — CBC WITH DIFFERENTIAL/PLATELET
Abs Immature Granulocytes: 0.03 10*3/uL (ref 0.00–0.07)
Basophils Absolute: 0 10*3/uL (ref 0.0–0.1)
Basophils Relative: 0 %
Eosinophils Absolute: 0.2 10*3/uL (ref 0.0–0.5)
Eosinophils Relative: 3 %
HCT: 27.6 % — ABNORMAL LOW (ref 36.0–46.0)
Hemoglobin: 9.1 g/dL — ABNORMAL LOW (ref 12.0–15.0)
Immature Granulocytes: 0 %
Lymphocytes Relative: 25 %
Lymphs Abs: 1.8 10*3/uL (ref 0.7–4.0)
MCH: 26.1 pg (ref 26.0–34.0)
MCHC: 33 g/dL (ref 30.0–36.0)
MCV: 79.3 fL — ABNORMAL LOW (ref 80.0–100.0)
Monocytes Absolute: 0.8 10*3/uL (ref 0.1–1.0)
Monocytes Relative: 11 %
Neutro Abs: 4.5 10*3/uL (ref 1.7–7.7)
Neutrophils Relative %: 61 %
Platelets: 66 10*3/uL — ABNORMAL LOW (ref 150–400)
RBC: 3.48 MIL/uL — ABNORMAL LOW (ref 3.87–5.11)
RDW: 18.8 % — ABNORMAL HIGH (ref 11.5–15.5)
WBC: 7.4 10*3/uL (ref 4.0–10.5)
nRBC: 0 % (ref 0.0–0.2)

## 2020-08-17 LAB — HEMOGLOBIN A1C

## 2020-08-17 LAB — GLUCOSE, CAPILLARY
Glucose-Capillary: 40 mg/dL — CL (ref 70–99)
Glucose-Capillary: 57 mg/dL — ABNORMAL LOW (ref 70–99)
Glucose-Capillary: 206 mg/dL — ABNORMAL HIGH (ref 70–99)
Glucose-Capillary: 104 mg/dL — ABNORMAL HIGH (ref 70–99)
Glucose-Capillary: 81 mg/dL (ref 70–99)
Glucose-Capillary: 116 mg/dL — ABNORMAL HIGH (ref 70–99)

## 2020-08-17 LAB — COMPREHENSIVE METABOLIC PANEL
ALT: 10 U/L (ref 0–44)
AST: 27 U/L (ref 15–41)
Albumin: 1.6 g/dL — ABNORMAL LOW (ref 3.5–5.0)
Alkaline Phosphatase: 85 U/L (ref 38–126)
Anion gap: 9 (ref 5–15)
BUN: 6 mg/dL — ABNORMAL LOW (ref 8–23)
CO2: 15 mmol/L — ABNORMAL LOW (ref 22–32)
Calcium: 7.6 mg/dL — ABNORMAL LOW (ref 8.9–10.3)
Chloride: 114 mmol/L — ABNORMAL HIGH (ref 98–111)
Creatinine, Ser: 0.92 mg/dL (ref 0.44–1.00)
GFR calc Af Amer: >60 mL/min (ref >60)
GFR calc non Af Amer: >60 mL/min (ref >60)
Glucose, Bld: 247 mg/dL — ABNORMAL HIGH (ref 70–99)
Potassium: 3.2 mmol/L — ABNORMAL LOW (ref 3.5–5.1)
Sodium: 138 mmol/L (ref 135–145)
Total Bilirubin: 0.6 mg/dL (ref 0.3–1.2)
Total Protein: 4.6 g/dL — ABNORMAL LOW (ref 6.5–8.1)

## 2020-08-17 LAB — MAGNESIUM: Magnesium: 1.3 mg/dL — ABNORMAL LOW (ref 1.7–2.4)

## 2020-08-17 MED ORDER — MAGNESIUM SULFATE 4 GM/100ML IV SOLN
4.0000 g | Freq: Once | INTRAVENOUS | Status: AC
  Administered 2020-08-17: 4 g via INTRAVENOUS
  Filled 2020-08-17: qty 100

## 2020-08-17 MED ORDER — DEXTROSE 50 % IV SOLN
INTRAVENOUS | Status: AC
  Filled 2020-08-17: qty 50

## 2020-08-17 MED ORDER — DEXTROSE 50 % IV SOLN
25.0000 g | INTRAVENOUS | Status: AC
  Administered 2020-08-17: 25 g via INTRAVENOUS

## 2020-08-17 MED ORDER — POTASSIUM CHLORIDE CRYS ER 20 MEQ PO TBCR
40.0000 meq | EXTENDED_RELEASE_TABLET | ORAL | Status: AC
  Administered 2020-08-17 (×2): 40 meq via ORAL
  Filled 2020-08-17 (×2): qty 2

## 2020-08-17 NOTE — Progress Notes (Deleted)
DISCHARGE NOTE HOME Katurah Furukawa to be discharged Home per MD order. Discussed prescriptions and follow up appointments with the patient. Prescriptions given to patient; medication list explained in detail. Patient verbalized understanding.  Skin clean, dry and intact without evidence of skin break down, no evidence of skin tears noted. IV catheter discontinued intact. Site without signs and symptoms of complications. Dressing and pressure applied. Pt denies pain at the site currently. No complaints noted.  Patient free of lines, drains, and wounds.   An After Visit Summary (AVS) was printed and given to the patient. Patient escorted via wheelchair, and discharged home via private auto.  Katrina Stack, RN

## 2020-08-17 NOTE — Plan of Care (Signed)
  Problem: Education: Goal: Knowledge of General Education information will improve Description Including pain rating scale, medication(s)/side effects and non-pharmacologic comfort measures Outcome: Progressing   

## 2020-08-17 NOTE — Progress Notes (Signed)
PROGRESS NOTE    Amber [name]  DGU:[id number] DOB: May 01, 1956 DOA: 08/15/2020 PCP: Sandi Mariscal, MD   Brief Narrative:  HPI: Amber [name] is a 64 y.o. female with medical history significant of recurrent UTIs, CVA, diastolic CHF, asthma, diabetes, GERD, hypertension, seizure disorder and previous small bowel obstruction who presented to the ER with sudden onset of fever chills diarrhea and dysuria for the last 3 to 4 days.  She has had similar episodes in the past when she has had UTIs.  Patient also has reported duodenitis in the past.  She has nausea but no vomiting.  Patient has had previous sepsis.  She has no known sick contacts.  No contact with somebody with COVID-19.  Patient found to be having sepsis syndrome most likely due to recurrent UTI and possible colitis she is being admitted to the hospital for further evaluation and treatment.  She is fully awake noncommunicating..  ED Course: Temperature 100.4 blood pressure 150/88 pulse 140 respiratory 30 oxygen sat 99% on room air.  White count 17.5 hemoglobin 10.3 and platelets 87 sodium 139 potassium 3.3 chloride 112 CO2 50 BUN 10 creatinine 1.10 calcium 8.0 INR 1.3 and glucose 100.  Chest x-ray showed no acute findings.  CT abdomen pelvis showed suspected an inflammatory or infectious colitis.  Urinalysis shows hazy urine with a large leukocytes.  WBC more than 50.  Lactic acid is 1.5.  Patient being admitted with sepsis secondary to UTI and possible colitis.  Assessment & Plan:   Principal Problem:   Sepsis due to gram-negative UTI ([place]) Active Problems:   History of seizure   Diabetes mellitus type 2, controlled ([place])   Failure to thrive in adult   Chronic diastolic (congestive) heart failure (HCC)   CKD (chronic kidney disease) stage 3, GFR 30-59 ml/min   Chronic anticoagulation   UTI (urinary tract infection)   Colitis, acute   #1 sepsis secondary to acute colitis: Patient met sepsis criteria based on tachypnea, tachycardia  and leukocytosis of 7.5.  Source of infection acute colitis based on the CT scan.  Patient was having diarrhea prior to coming however she has had 1 bowel movement since yesterday and according to patient, that was formed.  For some reason, stool sample was not collected.  C. difficile is pending.  Sepsis parameters are improving.  Continue Rocephin and Flagyl for now.  Await C. difficile results.  Follow cultures.  Continue gentle hydration.  #2 diabetes mellitus type II with hyperglycemia: Last hemoglobin A1c 4.4 about 5 months ago.  Checking hemoglobin A1c, order placed yesterday.  Still pending.  Had hypoglycemia this morning.  Continue sliding scale insulin.  #3 history of CVA: Stable.  Continue home regimen.  #4 history of seizure disorder: We will continue home regimen.  #5 chronic diastolic heart failure: Euvolemic.  Continue to monitor closely.  #6  Acute kidney injury: Likely due to dehydration.  Resolved.  Continue gentle hydration.  #7  Hypokalemia: 3.2 again.  Replace orally.  Recheck in the morning.  #8 chronic thrombocytopenia: Slightly lower again.  Likely due to sepsis.  Hemoglobin is stable.  No signs of bleeding.  #9.  Asymptomatic bacteriuria: Patient does not have any urinary complaints and her UA is not overly consistent with UTI.  Does not need treatment.  #10 hypomagnesemia: 1.3.  Replace today.  Recheck in the morning.  DVT prophylaxis:    Code Status: Full Code  Family Communication:  None present at bedside.  Plan of care discussed with patient  in length and he verbalized understanding and agreed with it.  Status is: Inpatient  Remains inpatient appropriate because:Inpatient level of care appropriate due to severity of illness   Dispo: The patient is from: Home              Anticipated d/c is to: Home              Anticipated d/c date is: 2 days              Patient currently is not medically stable to d/c.        Estimated body mass index is  23.44 kg/m as calculated from the following:   Height as of this encounter: 5' (1.524 m).   Weight as of this encounter: 54.4 kg.  Pressure Injury 10/10/18 Stage II -  Partial thickness loss of dermis presenting as a shallow open ulcer with a red, pink wound bed without slough. (Active)  10/10/18 2230  Location: Groin  Location Orientation: Left  Staging: Stage II -  Partial thickness loss of dermis presenting as a shallow open ulcer with a red, pink wound bed without slough.  Wound Description (Comments):   Present on Admission: Yes     Pressure Injury 10/10/18 Stage II -  Partial thickness loss of dermis presenting as a shallow open ulcer with a red, pink wound bed without slough. (Active)  10/10/18 2230  Location: Sacrum  Location Orientation: Mid  Staging: Stage II -  Partial thickness loss of dermis presenting as a shallow open ulcer with a red, pink wound bed without slough.  Wound Description (Comments):   Present on Admission: Yes     Pressure Injury 10/10/18 Stage II -  Partial thickness loss of dermis presenting as a shallow open ulcer with a red, pink wound bed without slough. (Active)  10/10/18 2230  Location: Buttocks  Location Orientation: Right  Staging: Stage II -  Partial thickness loss of dermis presenting as a shallow open ulcer with a red, pink wound bed without slough.  Wound Description (Comments):   Present on Admission: Yes     Pressure Injury 10/10/18 Stage II -  Partial thickness loss of dermis presenting as a shallow open ulcer with a red, pink wound bed without slough. (Active)  10/10/18 2230  Location: Buttocks  Location Orientation: Left  Staging: Stage II -  Partial thickness loss of dermis presenting as a shallow open ulcer with a red, pink wound bed without slough.  Wound Description (Comments):   Present on Admission: Yes     Pressure Injury 12/29/18 Stage I -  Intact skin with non-blanchable redness of a localized area usually over a bony  prominence. (Active)  12/29/18 2040  Location: Sacrum  Location Orientation: Medial  Staging: Stage I -  Intact skin with non-blanchable redness of a localized area usually over a bony prominence.  Wound Description (Comments):   Present on Admission: Yes     Nutritional status:               Consultants:   None  Procedures:   None  Antimicrobials:  Anti-infectives (From admission, onward)   Start     Dose/Rate Route Frequency Ordered Stop   08/15/20 2115  cefTRIAXone (ROCEPHIN) 2 g in sodium chloride 0.9 % 100 mL IVPB        2 g 200 mL/hr over 30 Minutes Intravenous Every 24 hours 08/15/20 2108     08/15/20 2115  metroNIDAZOLE (FLAGYL) IVPB 500 mg  500 mg 100 mL/hr over 60 Minutes Intravenous Every 8 hours 08/15/20 2108     08/15/20 1615  metroNIDAZOLE (FLAGYL) IVPB 500 mg        500 mg 100 mL/hr over 60 Minutes Intravenous  Once 08/15/20 1611 08/15/20 2022   08/15/20 1615  cefTRIAXone (ROCEPHIN) 1 g in sodium chloride 0.9 % 100 mL IVPB        1 g 200 mL/hr over 30 Minutes Intravenous  Once 08/15/20 1611 08/15/20 1850   08/15/20 1500  cefTRIAXone (ROCEPHIN) 1 g in sodium chloride 0.9 % 100 mL IVPB        1 g 200 mL/hr over 30 Minutes Intravenous  Once 08/15/20 1455 08/15/20 1802         Subjective: Seen and examined.  Complains of abdominal cramping.  No other complaint.  Objective: Vitals:   08/16/20 2139 08/17/20 0456 08/17/20 0829 08/17/20 0834  BP: 111/82 113/71 (!) 151/130 106/74  Pulse: 70 70 80 87  Resp: $Remo'16 18 18   'GYeZT$ Temp: 98 F (36.7 C) 97.9 F (36.6 C) 98.4 F (36.9 C)   TempSrc: Oral  Oral   SpO2: 100% 97% 100%   Weight:      Height:        Intake/Output Summary (Last 24 hours) at 08/17/2020 1107 Last data filed at 08/17/2020 0800 Gross per 24 hour  Intake 3450.27 ml  Output 401 ml  Net 3049.27 ml   Filed Weights   08/15/20 1214  Weight: 54.4 kg    Examination:  General exam: Appears calm and comfortable  Respiratory  system: Clear to auscultation. Respiratory effort normal. Cardiovascular system: S1 & S2 heard, RRR. No JVD, murmurs, rubs, gallops or clicks. No pedal edema. Gastrointestinal system: Abdomen is nondistended, soft and generalized abdominal tenderness. No organomegaly or masses felt. Normal bowel sounds heard. Central nervous system: Alert and oriented. No focal neurological deficits. Extremities: Symmetric 5 x 5 power. Skin: No rashes, lesions or ulcers.    Data Reviewed: I have personally reviewed following labs and imaging studies  CBC: Recent Labs  Lab 08/15/20 1252 08/16/20 0754 08/17/20 0830  WBC 17.5* 10.5 7.4  NEUTROABS  --   --  4.5  HGB 10.3* 8.4* 9.1*  HCT 31.6* 26.5* 27.6*  MCV 82.3 83.3 79.3*  PLT 87* 73* 66*   Basic Metabolic Panel: Recent Labs  Lab 08/15/20 1252 08/16/20 0754 08/17/20 0830  NA 139 141 138  K 3.3* 2.9* 3.2*  CL 112* 117* 114*  CO2 15* 16* 15*  GLUCOSE 100* 65* 247*  BUN 10 9 6*  CREATININE 1.10* 0.91 0.92  CALCIUM 8.0* 7.7* 7.6*  MG  --   --  1.3*   GFR: Estimated Creatinine Clearance: 45.5 mL/min (by C-G formula based on SCr of 0.92 mg/dL). Liver Function Tests: Recent Labs  Lab 08/15/20 1252 08/16/20 0754 08/17/20 0830  AST $Re'23 21 27  'nsW$ ALT $R'11 11 10  'Qv$ ALKPHOS 99 82 85  BILITOT 0.6 0.8 0.6  PROT 5.7* 4.7* 4.6*  ALBUMIN 2.1* 1.7* 1.6*   Recent Labs  Lab 08/15/20 1252  LIPASE 22   No results for input(s): AMMONIA in the last 168 hours. Coagulation Profile: Recent Labs  Lab 08/15/20 1419 08/16/20 0754  INR 1.3* 1.7*   Cardiac Enzymes: No results for input(s): CKTOTAL, CKMB, CKMBINDEX, TROPONINI in the last 168 hours. BNP (last 3 results) No results for input(s): PROBNP in the last 8760 hours. HbA1C: No results for input(s): HGBA1C in the last 72  hours. CBG: Recent Labs  Lab 08/16/20 1640 08/16/20 2136 08/17/20 0643 08/17/20 0713 08/17/20 0754  GLUCAP 227* 110* 40* 57* 206*   Lipid Profile: No results for  input(s): CHOL, HDL, LDLCALC, TRIG, CHOLHDL, LDLDIRECT in the last 72 hours. Thyroid Function Tests: No results for input(s): TSH, T4TOTAL, FREET4, T3FREE, THYROIDAB in the last 72 hours. Anemia Panel: No results for input(s): VITAMINB12, FOLATE, FERRITIN, TIBC, IRON, RETICCTPCT in the last 72 hours. Sepsis Labs: Recent Labs  Lab 08/15/20 1419 08/16/20 0754  PROCALCITON  --  0.10  LATICACIDVEN 1.5  --     Recent Results (from the past 240 hour(s))  Urine culture     Status: None   Collection Time: 08/15/20  2:17 PM   Specimen: Urine, Random  Result Value Ref Range Status   Specimen Description URINE, RANDOM  Final   Special Requests NONE  Final   Culture   Final    NO GROWTH Performed at [facility], [address] [place], [place] [address]    Report Status 08/16/2020 FINAL  Final  Blood culture (routine single)     Status: None (Preliminary result)   Collection Time: 08/15/20  2:19 PM   Specimen: BLOOD LEFT HAND  Result Value Ref Range Status   Specimen Description BLOOD LEFT HAND  Final   Special Requests   Final    BOTTLES DRAWN AEROBIC AND ANAEROBIC Blood Culture results may not be optimal due to an inadequate volume of blood received in culture bottles   Culture   Final    NO GROWTH 2 DAYS Performed at [facility], [address] [place], [place] [address]    Report Status PENDING  Incomplete  Respiratory Panel by RT PCR (Flu A&B, Covid) - Urine, Catheterized     Status: None   Collection Time: 08/15/20  2:19 PM   Specimen: Urine, Catheterized; Nasopharyngeal  Result Value Ref Range Status   SARS Coronavirus 2 by RT PCR NEGATIVE NEGATIVE Final    Comment: (NOTE) SARS-CoV-2 target nucleic acids are NOT DETECTED.  The SARS-CoV-2 RNA is generally detectable in upper respiratoy specimens during the acute phase of infection. The lowest concentration of SARS-CoV-2 viral copies this assay can detect is 131 copies/mL. A negative result does not preclude  SARS-Cov-2 infection and should not be used as the sole basis for treatment or other patient management decisions. A negative result may occur with  improper specimen collection/handling, submission of specimen other than nasopharyngeal swab, presence of viral mutation(s) within the areas targeted by this assay, and inadequate number of viral copies (<131 copies/mL). A negative result must be combined with clinical observations, patient history, and epidemiological information. The expected result is Negative.  Fact Sheet for Patients:  PinkCheek.be  Fact Sheet for Healthcare Providers:  GravelBags.it  This test is no t yet approved or cleared by the [place] FDA and  has been authorized for detection and/or diagnosis of SARS-CoV-2 by FDA under an Emergency Use Authorization (EUA). This EUA will remain  in effect (meaning this test can be used) for the duration of the COVID-19 declaration under Section 564(b)(1) of the Act, 21 U.S.C. section 360bbb-3(b)(1), unless the authorization is terminated or revoked sooner.     Influenza A by PCR NEGATIVE NEGATIVE Final   Influenza B by PCR NEGATIVE NEGATIVE Final    Comment: (NOTE) The Xpert Xpress SARS-CoV-2/FLU/RSV assay is intended as an aid in  the diagnosis of influenza from Nasopharyngeal swab specimens and  should not be used as a sole basis for treatment. Nasal washings and  aspirates are unacceptable for Xpert Xpress SARS-CoV-2/FLU/RSV  testing.  Fact Sheet for Patients: https://www.moore.com/  Fact Sheet for Healthcare Providers: https://www.young.biz/  This test is not yet approved or cleared by the [place] FDA and  has been authorized for detection and/or diagnosis of SARS-CoV-2 by  FDA under an Emergency Use Authorization (EUA). This EUA will remain  in effect (meaning this test can be used) for the duration of the    Covid-19 declaration under Section 564(b)(1) of the Act, 21  U.S.C. section 360bbb-3(b)(1), unless the authorization is  terminated or revoked. Performed at [facility], [address], [place], [place] [address]   Culture, blood (routine x 2)     Status: None (Preliminary result)   Collection Time: 08/15/20  2:23 PM   Specimen: BLOOD  Result Value Ref Range Status   Specimen Description BLOOD LEFT ANTECUBITAL  Final   Special Requests   Final    BOTTLES DRAWN AEROBIC AND ANAEROBIC Blood Culture results may not be optimal due to an inadequate volume of blood received in culture bottles   Culture   Final    NO GROWTH 2 DAYS Performed at [facility], [address] [place], [place] [address]    Report Status PENDING  Incomplete      Radiology Studies: CT Abdomen Pelvis Wo Contrast  Result Date: 08/15/2020 CLINICAL DATA:  Abdominal pain, fever and generalized body aches. Diarrhea. EXAM: CT ABDOMEN AND PELVIS WITHOUT CONTRAST TECHNIQUE: Multidetector CT imaging of the abdomen and pelvis was performed following the standard protocol without IV contrast. COMPARISON:  03/24/2020 FINDINGS: Lower chest: The lung bases are clear of an acute process. Chronic lung changes are noted. The heart is normal in size. Prominent pericardial fat. The distal esophagus is grossly normal. Hepatobiliary: No hepatic lesions are identified without contrast. The gallbladder is surgically absent. No biliary dilatation. Pancreas: Stable advanced pancreatic atrophy and diffuse calcifications consistent with chronic calcific pancreatitis. No acute inflammatory changes. Spleen: Normal size.  No focal lesions. Adrenals/Urinary Tract: The adrenal glands and kidneys are unremarkable. No renal lesions, hydronephrosis or obstructing ureteral calculi. The bladder is unremarkable. Stomach/Bowel: The stomach, duodenum and small bowel are unremarkable. No acute inflammatory changes, mass lesions or obstructive  findings. Suspect inflammation of the ascending, transverse and descending colon suggesting an inflammatory or infectious colitis. Scattered colonic diverticuli but no evidence of acute diverticulitis. The sigmoid colon and rectum are grossly normal. Vascular/Lymphatic: Advanced vascular calcifications but no aneurysm. No mesenteric or retroperitoneal mass or adenopathy. Reproductive: The uterus and ovaries are stable. Extensive calcifications are noted. Other: No pelvic mass or adenopathy. No free pelvic fluid collections. No inguinal mass or adenopathy. No abdominal wall hernia or subcutaneous lesions. Musculoskeletal: Advanced fatty atrophy of the hip and pelvic musculature. No acute bony findings. Osteoporosis and remote fractures involving T12, L1 and L3. IMPRESSION: 1. Suspect an inflammatory or infectious colitis. 2. No other significant abdominal/pelvic findings, mass lesions or adenopathy. 3. Stable advanced pancreatic atrophy and diffuse calcifications consistent with chronic calcific pancreatitis. 4. Status post cholecystectomy. No biliary dilatation. 5. Aortic atherosclerosis. Aortic Atherosclerosis (ICD10-I70.0). Electronically Signed   By: Rudie Meyer M.D.   On: 08/15/2020 16:05   DG Chest Port 1 View  Result Date: 08/15/2020 CLINICAL DATA:  Sepsis. EXAM: PORTABLE CHEST 1 VIEW COMPARISON:  12/24/2019 FINDINGS: The cardiac silhouette, mediastinal and hilar contours are within normal limits and stable given  the AP projection and portable technique. The lungs are clear of an acute process. No pleural effusions or pneumothorax. No pulmonary lesions. Stable thoracic fusion hardware and remote healed bilateral rib fractures. Stable advanced degenerative changes involving both shoulder joints. IMPRESSION: No acute cardiopulmonary findings. Electronically Signed   By: Marijo Sanes M.D.   On: 08/15/2020 14:43    Scheduled Meds: . amLODipine  10 mg Oral Daily  . apixaban  5 mg Oral BID  .  atorvastatin  10 mg Oral q1800  . folic acid  1 mg Oral Daily  . insulin aspart  0-5 Units Subcutaneous QHS  . insulin aspart  0-9 Units Subcutaneous TID WC  . lipase/protease/amylase  12,000 Units Oral TID AC  . metoprolol succinate  25 mg Oral Daily  . pantoprazole  40 mg Oral Daily  . predniSONE  5 mg Oral Q breakfast  . valproic acid  250 mg Oral TID   Continuous Infusions: . cefTRIAXone (ROCEPHIN)  IV 2 g (08/16/20 1802)  . lactated ringers 75 mL/hr at 08/17/20 0800  . metronidazole Stopped (08/17/20 0640)     LOS: 2 days   Time spent: 30 minutes   Darliss Cheney, MD [facility]  08/17/2020, 11:07 AM   To contact the attending provider between 7A-7P or the covering provider during after hours 7P-7A, please log into the web site [email or web address].

## 2020-08-17 NOTE — Evaluation (Signed)
Physical Therapy Evaluation Patient Details Name: Amber [name] MRN: [id number] DOB: October 28, 1956 Today's Date: 08/17/2020   History of Present Illness  Patient is a 64 y/o female who presents with fever, chills, diarrhea and dysuria. Admitted with sepsis secondary to recurrent UTIs and possible colitis. PMh includes CAD, asthma, DM, stroke, frequent falls, HTN, seizures, RA.  Clinical Impression  Patient presents with generalized weakness, decreased AROM BUEs/LEs, lethargy, impaired cognition and impaired mobility s/p above. Pt reports she lives at home with her granddaughter and has an aide come in 7 days/week to assist with IADLs/ADLs for ~2 hours daily. Reports she is a minimal ambulator and mostly just transfers at baseline. Today, mobility evaluation limited to bed level as pt declines any OOB mobility and wants to sleep. Noted to have stiffness and decreased mobility in left knee/hip/ankle with pain. Education on importance of mobility. If pt is not close to baseline and granddaughter can not provide level of assist needed for return home, pt will need SNF. Will follow acutely to further assess functional mobility and to provide any necessary DME or discharge recommendations as well as maximize independence.     Follow Up Recommendations SNF;Supervision for mobility/OOB (pending improvement)    Equipment Recommendations  None recommended by PT    Recommendations for Other Services       Precautions / Restrictions Precautions Precautions: Fall Restrictions Weight Bearing Restrictions: No      Mobility  Bed Mobility               General bed mobility comments: Declined any mobility. "when i am better, i will get up just fine."  Transfers                 General transfer comment: Declined any mobility.  Ambulation/Gait                Stairs            Wheelchair Mobility    Modified Rankin (Stroke Patients Only)       Balance                                              Pertinent Vitals/Pain Pain Assessment: Faces Faces Pain Scale: Hurts little more Pain Location: abdomen Pain Descriptors / Indicators: Aching Pain Intervention(s): Monitored during session    Home Living Family/patient expects to be discharged to:: Private residence Living Arrangements:  (granddaughter) Available Help at Discharge: Family;Personal care attendant;Available 24 hours/day Type of Home: Apartment Home Access: Stairs to enter Entrance Stairs-Rails: Right Entrance Stairs-Number of Steps: 5 Home Layout: One level Home Equipment: Emergency planning/management officer - 4 wheels;Wheelchair - manual;Hospital bed;Bedside commode      Prior Function Level of Independence: Needs assistance   Gait / Transfers Assistance Needed: Patient reports minimal ambulation. Reports she mostly just transfers.  ADL's / Homemaking Assistance Needed: HHA 2 hours/day 7 days/week.        Hand Dominance   Dominant Hand: Right    Extremity/Trunk Assessment   Upper Extremity Assessment Upper Extremity Assessment: Defer to OT evaluation (Weak grip bilaterally)    Lower Extremity Assessment Lower Extremity Assessment: Generalized weakness;LLE deficits/detail;RLE deficits/detail RLE Deficits / Details: Limited hip/knee AROM, pain with AAROM. RLE Sensation: WNL LLE Deficits / Details: Limited hip/knee flexion and ankle AROM, pain with AAROM. LLE Sensation: WNL       Communication  Communication: No difficulties  Cognition Arousal/Alertness: Lethargic Behavior During Therapy: WFL for tasks assessed/performed Overall Cognitive Status: Impaired/Different from baseline Area of Impairment: Orientation;Attention;Memory;Awareness;Problem solving                 Orientation Level: Disoriented to;Time Current Attention Level: Focused Memory: Decreased short-term memory     Awareness: Intellectual Problem Solving: Slow processing;Requires verbal  cues General Comments: Difficult historian. Answering questions and stating things unrelated to questions asked at times. Eyes remained closed for most of session. "if you leave me like this (covered in blankets) i will be good." Mumbling low toned speech difficult to understand at times.      General Comments General comments (skin integrity, edema, etc.): VSS.    Exercises General Exercises - Lower Extremity Ankle Circles/Pumps: AAROM;Both;10 reps;Supine Heel Slides: AAROM;Both;5 reps;Supine Hip ABduction/ADduction: AAROM;Both;5 reps;Supine Hip Flexion/Marching: AAROM;Both;5 reps;Supine   Assessment/Plan    PT Assessment Patient needs continued PT services  PT Problem List Decreased strength;Decreased mobility;Decreased safety awareness;Decreased range of motion;Decreased activity tolerance;Decreased cognition;Pain       PT Treatment Interventions Therapeutic activities;Gait training;Therapeutic exercise;Patient/family education;Balance training;Wheelchair mobility training;Neuromuscular re-education;Functional mobility training;Stair training;Cognitive remediation    PT Goals (Current goals can be found in the Care Plan section)  Acute Rehab PT Goals Patient Stated Goal: to sleep PT Goal Formulation: With patient Time For Goal Achievement: 08/31/20 Potential to Achieve Goals: Fair    Frequency Min 3X/week   Barriers to discharge Inaccessible home environment stairs to enter home    Co-evaluation               AM-PAC PT "6 Clicks" Mobility  Outcome Measure Help needed turning from your back to your side while in a flat bed without using bedrails?: A Lot Help needed moving from lying on your back to sitting on the side of a flat bed without using bedrails?: A Lot Help needed moving to and from a bed to a chair (including a wheelchair)?: A Lot Help needed standing up from a chair using your arms (e.g., wheelchair or bedside chair)?: A Lot Help needed to walk in  hospital room?: A Lot Help needed climbing 3-5 steps with a railing? : A Lot 6 Click Score: 12    End of Session   Activity Tolerance: Patient limited by lethargy;Other (comment) (reluctant to mobilize) Patient left: in bed;with call bell/phone within reach;with bed alarm set Nurse Communication: Mobility status PT Visit Diagnosis: Pain;Muscle weakness (generalized) (M62.81) Pain - part of body:  (abdomen)    Time: 1341-1400 PT Time Calculation (min) (ACUTE ONLY): 19 min   Charges:   PT Evaluation $PT Eval Moderate Complexity: 1 Mod          Vale Haven, PT, DPT Acute Rehabilitation Services Pager [phone] Office [phone]      Blake Divine A Lanier Ensign 08/17/2020, 2:11 PM

## 2020-08-17 NOTE — Plan of Care (Signed)
  Problem: Clinical Measurements: Goal: Diagnostic test results will improve Outcome: Progressing   Problem: Nutrition: Goal: Adequate nutrition will be maintained Outcome: Progressing   Problem: Elimination: Goal: Will not experience complications related to bowel motility Outcome: Progressing   

## 2020-08-17 NOTE — Progress Notes (Signed)
Hypoglycemic Event  CBG: 40  Treatment: Dextrose 50% solution Symptoms: None Follow-up CBG: Time: 0754 CBG Result: 206  Possible Reasons for Event: Poor oral intake. Patient stated she left her dentures at home.   Comments/MD notified: Hypoglycemic protocol followed    Mikea Quadros B Ramy Greth

## 2020-08-18 LAB — COMPREHENSIVE METABOLIC PANEL
ALT: 12 U/L (ref 0–44)
AST: 27 U/L (ref 15–41)
Albumin: 1.6 g/dL — ABNORMAL LOW (ref 3.5–5.0)
Alkaline Phosphatase: 95 U/L (ref 38–126)
Anion gap: 12 (ref 5–15)
BUN: 6 mg/dL — ABNORMAL LOW (ref 8–23)
CO2: 15 mmol/L — ABNORMAL LOW (ref 22–32)
Calcium: 7.6 mg/dL — ABNORMAL LOW (ref 8.9–10.3)
Chloride: 113 mmol/L — ABNORMAL HIGH (ref 98–111)
Creatinine, Ser: 0.99 mg/dL (ref 0.44–1.00)
GFR calc Af Amer: >60 mL/min (ref >60)
GFR calc non Af Amer: >60 mL/min (ref >60)
Glucose, Bld: 84 mg/dL (ref 70–99)
Potassium: 4.1 mmol/L (ref 3.5–5.1)
Sodium: 140 mmol/L (ref 135–145)
Total Bilirubin: 0.5 mg/dL (ref 0.3–1.2)
Total Protein: 4.7 g/dL — ABNORMAL LOW (ref 6.5–8.1)

## 2020-08-18 LAB — GLUCOSE, CAPILLARY
Glucose-Capillary: 68 mg/dL — ABNORMAL LOW (ref 70–99)
Glucose-Capillary: 86 mg/dL (ref 70–99)
Glucose-Capillary: 109 mg/dL — ABNORMAL HIGH (ref 70–99)
Glucose-Capillary: 142 mg/dL — ABNORMAL HIGH (ref 70–99)
Glucose-Capillary: 121 mg/dL — ABNORMAL HIGH (ref 70–99)

## 2020-08-18 LAB — CBC WITH DIFFERENTIAL/PLATELET
Abs Immature Granulocytes: 0.05 10*3/uL (ref 0.00–0.07)
Basophils Absolute: 0 10*3/uL (ref 0.0–0.1)
Basophils Relative: 0 %
Eosinophils Absolute: 0 10*3/uL (ref 0.0–0.5)
Eosinophils Relative: 0 %
HCT: 25 % — ABNORMAL LOW (ref 36.0–46.0)
Hemoglobin: 8.5 g/dL — ABNORMAL LOW (ref 12.0–15.0)
Immature Granulocytes: 1 %
Lymphocytes Relative: 7 %
Lymphs Abs: 0.7 10*3/uL (ref 0.7–4.0)
MCH: 25.9 pg — ABNORMAL LOW (ref 26.0–34.0)
MCHC: 34 g/dL (ref 30.0–36.0)
MCV: 76.2 fL — ABNORMAL LOW (ref 80.0–100.0)
Monocytes Absolute: 0.2 10*3/uL (ref 0.1–1.0)
Monocytes Relative: 2 %
Neutro Abs: 8.8 10*3/uL — ABNORMAL HIGH (ref 1.7–7.7)
Neutrophils Relative %: 90 %
Platelets: 50 10*3/uL — ABNORMAL LOW (ref 150–400)
RBC: 3.28 MIL/uL — ABNORMAL LOW (ref 3.87–5.11)
RDW: 18.3 % — ABNORMAL HIGH (ref 11.5–15.5)
WBC: 9.7 10*3/uL (ref 4.0–10.5)
nRBC: 0 % (ref 0.0–0.2)

## 2020-08-18 LAB — MAGNESIUM: Magnesium: 2.3 mg/dL (ref 1.7–2.4)

## 2020-08-18 LAB — C DIFFICILE QUICK SCREEN W PCR REFLEX
C Diff antigen: NEGATIVE
C Diff interpretation: NOT DETECTED
C Diff toxin: NEGATIVE

## 2020-08-18 MED ORDER — SODIUM CHLORIDE 0.9 % IV SOLN
INTRAVENOUS | Status: DC | PRN
  Administered 2020-08-18: 250 mL via INTRAVENOUS

## 2020-08-18 MED ORDER — OXYCODONE HCL 5 MG PO TABS
20.0000 mg | ORAL_TABLET | Freq: Four times a day (QID) | ORAL | Status: DC
  Administered 2020-08-18 – 2020-08-20 (×8): 20 mg via ORAL
  Filled 2020-08-18 (×8): qty 4

## 2020-08-18 NOTE — Progress Notes (Signed)
PROGRESS NOTE    Amber [name]  VQQ:[id number] DOB: November 03, 1956 DOA: 08/15/2020 PCP: Sandi Mariscal, MD   Brief Narrative:  Amber [name] is a 64 y.o. female with medical history significant of recurrent UTIs, CVA, diastolic CHF, asthma, diabetes, GERD, hypertension, seizure disorder and previous small bowel obstruction who presented to the ER with sudden onset of fever chills diarrhea and dysuria for the last 3 to 4 days.  She has had similar episodes in the past when she has had UTIs.  Upon arrival to ED, temperature was 100.4.  Pulse 140 and respiration 30.  Oxygen saturation was normal and blood pressure was normal as well.  White cells were 17.5. CT abdomen pelvis showed suspected an inflammatory or infectious colitis.  Urinalysis shows hazy urine with a large leukocytes.  WBC more than 50. Patient found to be having sepsis due to possible colitis.  She was started on antibiotics and admitted to hospital service.  Biotics were continued.  C. difficile tested negative.   Assessment & Plan:   Principal Problem:   Sepsis due to gram-negative UTI ([place]) Active Problems:   History of seizure   Diabetes mellitus type 2, controlled ([facility])   Failure to thrive in adult   Chronic diastolic (congestive) heart failure (H[facility])   CKD (chronic kidney disease) stage 3, GFR 30-59 ml/min   Chronic anticoagulation   UTI (urinary tract infection)   Colitis, acute   #1 sepsis secondary to acute colitis: Patient met sepsis criteria based on tachypnea, tachycardia and leukocytosis of 7.5.  Source of infection acute colitis based on the CT scan.  Patient was having diarrhea prior to hospitalization but she has been having solid BMs x1/day here.  Today C. difficile was tested and she is negative.  Still complains of abdominal pain and has diffuse abdominal tenderness however she has tenderness everywhere she is touched.  Blood culture negative.  Continue current antibiotics.  #2  Uncontrolled diabetes mellitus type  II with hyperglycemia: Last hemoglobin A1c 4.4 about 5 months ago.  Checking hemoglobin A1c, order placed yesterday.  Still pending.  Had hypoglycemia this morning again.  Continue sliding scale insulin.  #3 history of CVA: Stable.  Continue home regimen.  #4 history of seizure disorder: We will continue home regimen.  #5 chronic diastolic heart failure: Euvolemic.  Continue to monitor closely.  #6  Acute kidney injury: Likely due to dehydration.  Resolved.  Continue gentle hydration.  #7  Hypokalemia: Resolved  #8 chronic thrombocytopenia: Slightly lower again.  Likely due to sepsis.  Hemoglobin is stable.  No signs of bleeding.  #9.  Asymptomatic bacteriuria: Patient does not have any urinary complaints and her UA is not overly consistent with UTI.  Does not need treatment.  #10 hypomagnesemia: Resolved.  DVT prophylaxis:    Code Status: Full Code  Family Communication:  None present at bedside.  Plan of care discussed with patient in length and he verbalized understanding and agreed with it.  Status is: Inpatient  Remains inpatient appropriate because:Inpatient level of care appropriate due to severity of illness   Dispo: The patient is from: Home              Anticipated d/c is to: snf              Anticipated d/c date is: 2 days              Patient currently is medically stable to d/c.        Estimated body mass  index is 23.44 kg/m as calculated from the following:   Height as of this encounter: 5' (1.524 m).   Weight as of this encounter: 54.4 kg.  Pressure Injury 10/10/18 Stage II -  Partial thickness loss of dermis presenting as a shallow open ulcer with a red, pink wound bed without slough. (Active)  10/10/18 2230  Location: Groin  Location Orientation: Left  Staging: Stage II -  Partial thickness loss of dermis presenting as a shallow open ulcer with a red, pink wound bed without slough.  Wound Description (Comments):   Present on Admission: Yes       Pressure Injury 10/10/18 Stage II -  Partial thickness loss of dermis presenting as a shallow open ulcer with a red, pink wound bed without slough. (Active)  10/10/18 2230  Location: Sacrum  Location Orientation: Mid  Staging: Stage II -  Partial thickness loss of dermis presenting as a shallow open ulcer with a red, pink wound bed without slough.  Wound Description (Comments):   Present on Admission: Yes     Pressure Injury 10/10/18 Stage II -  Partial thickness loss of dermis presenting as a shallow open ulcer with a red, pink wound bed without slough. (Active)  10/10/18 2230  Location: Buttocks  Location Orientation: Right  Staging: Stage II -  Partial thickness loss of dermis presenting as a shallow open ulcer with a red, pink wound bed without slough.  Wound Description (Comments):   Present on Admission: Yes     Pressure Injury 10/10/18 Stage II -  Partial thickness loss of dermis presenting as a shallow open ulcer with a red, pink wound bed without slough. (Active)  10/10/18 2230  Location: Buttocks  Location Orientation: Left  Staging: Stage II -  Partial thickness loss of dermis presenting as a shallow open ulcer with a red, pink wound bed without slough.  Wound Description (Comments):   Present on Admission: Yes     Pressure Injury 12/29/18 Stage I -  Intact skin with non-blanchable redness of a localized area usually over a bony prominence. (Active)  12/29/18 2040  Location: Sacrum  Location Orientation: Medial  Staging: Stage I -  Intact skin with non-blanchable redness of a localized area usually over a bony prominence.  Wound Description (Comments):   Present on Admission: Yes     Nutritional status:               Consultants:   None  Procedures:   None  Antimicrobials:  Anti-infectives (From admission, onward)   Start     Dose/Rate Route Frequency Ordered Stop   08/15/20 2115  cefTRIAXone (ROCEPHIN) 2 g in sodium chloride 0.9 % 100 mL IVPB         2 g 200 mL/hr over 30 Minutes Intravenous Every 24 hours 08/15/20 2108     08/15/20 2115  metroNIDAZOLE (FLAGYL) IVPB 500 mg        500 mg 100 mL/hr over 60 Minutes Intravenous Every 8 hours 08/15/20 2108     08/15/20 1615  metroNIDAZOLE (FLAGYL) IVPB 500 mg        500 mg 100 mL/hr over 60 Minutes Intravenous  Once 08/15/20 1611 08/15/20 2022   08/15/20 1615  cefTRIAXone (ROCEPHIN) 1 g in sodium chloride 0.9 % 100 mL IVPB        1 g 200 mL/hr over 30 Minutes Intravenous  Once 08/15/20 1611 08/15/20 1850   08/15/20 1500  cefTRIAXone (ROCEPHIN) 1 g in sodium chloride 0.9 %  100 mL IVPB        1 g 200 mL/hr over 30 Minutes Intravenous  Once 08/15/20 1455 08/15/20 1802         Subjective: Seen and examined.  Still complains of pain in the belly, cramping in the legs and bilateral shoulder pain.  Objective: Vitals:   08/17/20 1632 08/17/20 2045 08/18/20 0514 08/18/20 0959  BP: 102/80 100/73 103/71 101/72  Pulse: 72 96 78 78  Resp: $Remo'18 18 18 18  'TuWex$ Temp: 98.5 F (36.9 C) 97.9 F (36.6 C) 98.2 F (36.8 C) 99 F (37.2 C)  TempSrc: Oral   Oral  SpO2: 99% 97% 97% 100%  Weight:      Height:        Intake/Output Summary (Last 24 hours) at 08/18/2020 1304 Last data filed at 08/18/2020 1153 Gross per 24 hour  Intake 2234.12 ml  Output 1450 ml  Net 784.12 ml   Filed Weights   08/15/20 1214  Weight: 54.4 kg    Examination:  General exam: Appears calm and comfortable  Respiratory system: Clear to auscultation. Respiratory effort normal. Cardiovascular system: S1 & S2 heard, RRR. No JVD, murmurs, rubs, gallops or clicks. No pedal edema. Gastrointestinal system: Abdomen is nondistended, soft and generalized abdominal tenderness. No organomegaly or masses felt. Normal bowel sounds heard. Central nervous system: Alert and oriented. No focal neurological deficits. Extremities: Symmetric 5 x 5 power.  Tenderness to touch everywhere. Skin: No rashes, lesions or ulcers.  Psychiatry:  Judgement and insight appear normal. Mood & affect appropriate.     Data Reviewed: I have personally reviewed following labs and imaging studies  CBC: Recent Labs  Lab 08/15/20 1252 08/16/20 0754 08/17/20 0830 08/18/20 0156  WBC 17.5* 10.5 7.4 9.7  NEUTROABS  --   --  4.5 8.8*  HGB 10.3* 8.4* 9.1* 8.5*  HCT 31.6* 26.5* 27.6* 25.0*  MCV 82.3 83.3 79.3* 76.2*  PLT 87* 73* 66* 50*   Basic Metabolic Panel: Recent Labs  Lab 08/15/20 1252 08/16/20 0754 08/17/20 0830 08/18/20 0156  NA 139 141 138 140  K 3.3* 2.9* 3.2* 4.1  CL 112* 117* 114* 113*  CO2 15* 16* 15* 15*  GLUCOSE 100* 65* 247* 84  BUN 10 9 6* 6*  CREATININE 1.10* 0.91 0.92 0.99  CALCIUM 8.0* 7.7* 7.6* 7.6*  MG  --   --  1.3* 2.3   GFR: Estimated Creatinine Clearance: 42.3 mL/min (by C-G formula based on SCr of 0.99 mg/dL). Liver Function Tests: Recent Labs  Lab 08/15/20 1252 08/16/20 0754 08/17/20 0830 08/18/20 0156  AST $Re'23 21 27 27  'zXB$ ALT $R'11 11 10 12  'hy$ ALKPHOS 99 82 85 95  BILITOT 0.6 0.8 0.6 0.5  PROT 5.7* 4.7* 4.6* 4.7*  ALBUMIN 2.1* 1.7* 1.6* 1.6*   Recent Labs  Lab 08/15/20 1252  LIPASE 22   No results for input(s): AMMONIA in the last 168 hours. Coagulation Profile: Recent Labs  Lab 08/15/20 1419 08/16/20 0754  INR 1.3* 1.7*   Cardiac Enzymes: No results for input(s): CKTOTAL, CKMB, CKMBINDEX, TROPONINI in the last 168 hours. BNP (last 3 results) No results for input(s): PROBNP in the last 8760 hours. HbA1C: Recent Labs    08/16/20 0754  HGBA1C QNSTST   CBG: Recent Labs  Lab 08/17/20 1630 08/17/20 2043 08/18/20 0648 08/18/20 0716 08/18/20 1144  GLUCAP 81 116* 68* 86 109*   Lipid Profile: No results for input(s): CHOL, HDL, LDLCALC, TRIG, CHOLHDL, LDLDIRECT in the last 72 hours. Thyroid  Function Tests: No results for input(s): TSH, T4TOTAL, FREET4, T3FREE, THYROIDAB in the last 72 hours. Anemia Panel: No results for input(s): VITAMINB12, FOLATE, FERRITIN, TIBC, IRON,  RETICCTPCT in the last 72 hours. Sepsis Labs: Recent Labs  Lab 08/15/20 1419 08/16/20 0754  PROCALCITON  --  0.10  LATICACIDVEN 1.5  --     Recent Results (from the past 240 hour(s))  Urine culture     Status: None   Collection Time: 08/15/20  2:17 PM   Specimen: Urine, Random  Result Value Ref Range Status   Specimen Description URINE, RANDOM  Final   Special Requests NONE  Final   Culture   Final    NO GROWTH Performed at [facility], [address], [place], [place] [address]    Report Status 08/16/2020 FINAL  Final  Blood culture (routine single)     Status: None (Preliminary result)   Collection Time: 08/15/20  2:19 PM   Specimen: BLOOD LEFT HAND  Result Value Ref Range Status   Specimen Description BLOOD LEFT HAND  Final   Special Requests   Final    BOTTLES DRAWN AEROBIC AND ANAEROBIC Blood Culture results may not be optimal due to an inadequate volume of blood received in culture bottles   Culture   Final    NO GROWTH 3 DAYS Performed at [facility], [place] [address], [place], [place] [address]    Report Status PENDING  Incomplete  Respiratory Panel by RT PCR (Flu A&B, Covid) - Urine, Catheterized     Status: None   Collection Time: 08/15/20  2:19 PM   Specimen: Urine, Catheterized; Nasopharyngeal  Result Value Ref Range Status   SARS Coronavirus 2 by RT PCR NEGATIVE NEGATIVE Final    Comment: (NOTE) SARS-CoV-2 target nucleic acids are NOT DETECTED.  The SARS-CoV-2 RNA is generally detectable in upper respiratoy specimens during the acute phase of infection. The lowest concentration of SARS-CoV-2 viral copies this assay can detect is 131 copies/mL. A negative result does not preclude SARS-Cov-2 infection and should not be used as the sole basis for treatment or other patient management decisions. A negative result may occur with  improper specimen collection/handling, submission of specimen other than nasopharyngeal swab, presence of viral  mutation(s) within the areas targeted by this assay, and inadequate number of viral copies (<131 copies/mL). A negative result must be combined with clinical observations, patient history, and epidemiological information. The expected result is Negative.  Fact Sheet for Patients:  PinkCheek.be  Fact Sheet for Healthcare Providers:  GravelBags.it  This test is no t yet approved or cleared by the [place] FDA and  has been authorized for detection and/or diagnosis of SARS-CoV-2 by FDA under an Emergency Use Authorization (EUA). This EUA will remain  in effect (meaning this test can be used) for the duration of the COVID-19 declaration under Section 564(b)(1) of the Act, 21 U.S.C. section 360bbb-3(b)(1), unless the authorization is terminated or revoked sooner.     Influenza A by PCR NEGATIVE NEGATIVE Final   Influenza B by PCR NEGATIVE NEGATIVE Final    Comment: (NOTE) The Xpert Xpress SARS-CoV-2/FLU/RSV assay is intended as an aid in  the diagnosis of influenza from Nasopharyngeal swab specimens and  should not be used as a sole basis for treatment. Nasal washings and  aspirates are unacceptable for Xpert Xpress SARS-CoV-2/FLU/RSV  testing.  Fact Sheet for Patients: PinkCheek.be  Fact Sheet for Healthcare Providers: GravelBags.it  This test is not yet approved  or cleared by the [place] and  has been authorized for detection and/or diagnosis of SARS-CoV-2 by  FDA under an Emergency Use Authorization (EUA). This EUA will remain  in effect (meaning this test can be used) for the duration of the  Covid-19 declaration under Section 564(b)(1) of the Act, 21  U.S.C. section 360bbb-3(b)(1), unless the authorization is  terminated or revoked. Performed at [facility], [place] [address], [place], [place] [address]   Culture, blood (routine x 2)      Status: None (Preliminary result)   Collection Time: 08/15/20  2:23 PM   Specimen: BLOOD  Result Value Ref Range Status   Specimen Description BLOOD LEFT ANTECUBITAL  Final   Special Requests   Final    BOTTLES DRAWN AEROBIC AND ANAEROBIC Blood Culture results may not be optimal due to an inadequate volume of blood received in culture bottles   Culture   Final    NO GROWTH 3 DAYS Performed at [facility], [place] [address], [place], [place] [address]    Report Status PENDING  Incomplete  C Difficile Quick Screen w PCR reflex     Status: None   Collection Time: 08/18/20  8:27 AM   Specimen: STOOL  Result Value Ref Range Status   C Diff antigen NEGATIVE NEGATIVE Final   C Diff toxin NEGATIVE NEGATIVE Final   C Diff interpretation No C. difficile detected.  Final    Comment: Performed at [facility], [place] [address], [place], [place] [address]      Radiology Studies: No results found.  Scheduled Meds: . amLODipine  10 mg Oral Daily  . apixaban  5 mg Oral BID  . atorvastatin  10 mg Oral q1800  . folic acid  1 mg Oral Daily  . insulin aspart  0-5 Units Subcutaneous QHS  . insulin aspart  0-9 Units Subcutaneous TID WC  . lipase/protease/amylase  12,000 Units Oral TID AC  . metoprolol succinate  25 mg Oral Daily  . pantoprazole  40 mg Oral Daily  . predniSONE  5 mg Oral Q breakfast  . valproic acid  250 mg Oral TID   Continuous Infusions: . cefTRIAXone (ROCEPHIN)  IV 2 g (08/17/20 1752)  . lactated ringers 75 mL/hr at 08/18/20 1241  . metronidazole 500 mg (08/18/20 1243)     LOS: 3 days   Time spent: 29 minutes   Darliss Cheney, MD [facility]  08/18/2020, 1:04 PM   To contact the attending provider between 7A-7P or the covering provider during after hours 7P-7A, please log into the web site [email or web address].

## 2020-08-18 NOTE — Evaluation (Signed)
Occupational Therapy Evaluation Patient Details Name: Amber [name] MRN: [id number] DOB: 1956-11-03 Today's Date: 08/18/2020    History of Present Illness Patient is a 64 y/o female who presents with fever, chills, diarrhea and dysuria. Admitted with sepsis secondary to recurrent UTIs and possible colitis. PMh includes CAD, asthma, DM, stroke, frequent falls, HTN, seizures, RA.   Clinical Impression   PTA patient reports needing assist for mobility, transfers and most ADLs but able to feed and groom herself. She has assistance from granddaughter and PCA 7 days/week 2 hrs/day.  She was admitted for above and is limited by problem list below, including impaired cognition, BUE weakness and decreased functional ROM, and decreased activity tolerance.  She is self limiting, but disoriented to time, inconsistently following commands and demonstrates poor awareness to situation.  Patient currently requires max assist to drink her coffee, total assist to wash her face; multimodal cueing to engage in limited UE ROM at bed level.  Pt declines bed mobility or OOB at this time. She will benefit from continued OT services while admitted and after dc at SNF Level at this time to optimize independence and return to PLOF.     Follow Up Recommendations  SNF;Supervision/Assistance - 24 hour    Equipment Recommendations  3 in 1 bedside commode    Recommendations for Other Services       Precautions / Restrictions Precautions Precautions: Fall Restrictions Weight Bearing Restrictions: No      Mobility Bed Mobility               General bed mobility comments: pt declined any mobility at this time   Transfers                 General transfer comment: declined OOB     Balance                                           ADL either performed or assessed with clinical judgement   ADL Overall ADL's : Needs assistance/impaired Eating/Feeding: Maximal assistance;Bed  level Eating/Feeding Details (indicate cue type and reason): HOB elevated to 30 degress, HOH required to manage coffee and bring to mouth  Grooming: Total assistance;Bed level Grooming Details (indicate cue type and reason): bed level attempted HOH to wash face, pt limited by pain and requires total assist                              Functional mobility during ADLs: Total assistance General ADL Comments: total assist for all ADLs at this time      Vision   Vision Assessment?: No apparent visual deficits     Perception     Praxis      Pertinent Vitals/Pain Pain Assessment: Faces Faces Pain Scale: Hurts little more Pain Location: B LEs  Pain Descriptors / Indicators: Discomfort;Sore;Aching Pain Intervention(s): Monitored during session;Patient requesting pain meds-RN notified     Hand Dominance Right   Extremity/Trunk Assessment Upper Extremity Assessment Upper Extremity Assessment: RUE deficits/detail;LUE deficits/detail;Difficult to assess due to impaired cognition RUE Deficits / Details: AAROM to 90 shoulder FF, increased time to encourage AROM at elbow and grasp 3-/5 MMT; pt limited by arthritic pain  LUE Deficits / Details: AAROM to 90 shoulder FF, increased time to encourage AROM at elbow and grasp 3-/5 MMT; pt limited by arthritic pain  Lower Extremity Assessment Lower Extremity Assessment: Defer to PT evaluation       Communication Communication Communication: No difficulties   Cognition Arousal/Alertness: Awake/alert Behavior During Therapy: WFL for tasks assessed/performed Overall Cognitive Status: Impaired/Different from baseline Area of Impairment: Orientation;Attention;Memory;Awareness;Problem solving;Following commands;Safety/judgement                 Orientation Level: Disoriented to;Time Current Attention Level: Focused Memory: Decreased short-term memory Following Commands: Follows one step commands inconsistently;Follows one step  commands with increased time Safety/Judgement: Decreased awareness of safety;Decreased awareness of deficits Awareness: Intellectual Problem Solving: Slow processing;Requires verbal cues;Decreased initiation;Difficulty sequencing;Requires tactile cues General Comments: patient disoriented to time (reports 2001) requires choices for month, follows simple commands inconsistently with poor awareness of deficits.  Limited historian initally reporting she feeds herself but then reports "I don't because it ends up on the floor"    General Comments  VSS, educated on importance of mobility and OOB with pt reports " I know I'm weak" but declines further mobility       Exercises     Shoulder Instructions      Home Living Family/patient expects to be discharged to:: Private residence Living Arrangements:  (granddaughter ) Available Help at Discharge: Family;Personal care attendant;Available 24 hours/day Type of Home: Apartment Home Access: Stairs to enter Entrance Stairs-Number of Steps: 5 Entrance Stairs-Rails: Right Home Layout: One level     Bathroom Shower/Tub: Chief Strategy Officer: Standard Bathroom Accessibility: Yes   Home Equipment: Emergency planning/management officer - 4 wheels;Wheelchair - manual;Hospital bed;Bedside commode          Prior Functioning/Environment Level of Independence: Needs assistance  Gait / Transfers Assistance Needed: Patient reports minimal ambulation. Reports she mostly just transfers. ADL's / Homemaking Assistance Needed: HHA 2 hours/day 7 days/week, but reports "she doesn't do anything"--patient reports granddaughter assists with ADLs but she is able to groom and feed herself            OT Problem List: Decreased strength;Decreased activity tolerance;Impaired balance (sitting and/or standing);Pain;Decreased range of motion;Decreased coordination;Decreased cognition;Decreased safety awareness;Decreased knowledge of use of DME or AE;Decreased knowledge of  precautions;Impaired UE functional use      OT Treatment/Interventions: Self-care/ADL training;DME and/or AE instruction;Therapeutic activities;Patient/family education;Balance training;Cognitive remediation/compensation;Therapeutic exercise    OT Goals(Current goals can be found in the care plan section) Acute Rehab OT Goals Patient Stated Goal: to stay warm  OT Goal Formulation: With patient Time For Goal Achievement: 09/01/20 Potential to Achieve Goals: Fair  OT Frequency: Min 2X/week   Barriers to D/C:            Co-evaluation              AM-PAC OT "6 Clicks" Daily Activity     Outcome Measure Help from another person eating meals?: A Lot Help from another person taking care of personal grooming?: Total Help from another person toileting, which includes using toliet, bedpan, or urinal?: Total Help from another person bathing (including washing, rinsing, drying)?: Total Help from another person to put on and taking off regular upper body clothing?: Total Help from another person to put on and taking off regular lower body clothing?: Total 6 Click Score: 7   End of Session Nurse Communication: Mobility status;Patient requests pain meds  Activity Tolerance: Patient limited by fatigue;Patient limited by pain Patient left: in bed;with call bell/phone within reach;with bed alarm set  OT Visit Diagnosis: Other abnormalities of gait and mobility (R26.89);Muscle weakness (generalized) (M62.81);Pain Pain - Right/Left:  (  bil) Pain - part of body: Leg                Time: 3545-6256 OT Time Calculation (min): 19 min Charges:  OT General Charges $OT Visit: 1 Visit OT Evaluation $OT Eval Moderate Complexity: 1 Mod  Barry Brunner, OT Acute Rehabilitation Services Pager [phone] Office [phone]   Amber [name] 08/18/2020, 9:45 AM

## 2020-08-18 NOTE — Progress Notes (Signed)
Hypoglycemic Event  CBG: 68  Treatment: 8 oz juice/soda  Symptoms: None  Follow-up CBG: Time:0716 CBG Result:86  Possible Reasons for Event: Unknown  Comments/MD notified:Per hypoglycemia protocol    Myles Mallicoat Joselita

## 2020-08-19 LAB — CBC WITH DIFFERENTIAL/PLATELET
Abs Immature Granulocytes: 0.08 10*3/uL — ABNORMAL HIGH (ref 0.00–0.07)
Abs Immature Granulocytes: 0 10*3/uL (ref 0.00–0.07)
Basophils Absolute: 0 10*3/uL (ref 0.0–0.1)
Basophils Absolute: 0.1 10*3/uL (ref 0.0–0.1)
Basophils Relative: 0 %
Basophils Relative: 1 %
Eosinophils Absolute: 0.1 10*3/uL (ref 0.0–0.5)
Eosinophils Absolute: 0 10*3/uL (ref 0.0–0.5)
Eosinophils Relative: 1 %
Eosinophils Relative: 0 %
HCT: 22 % — ABNORMAL LOW (ref 36.0–46.0)
HCT: 24.1 % — ABNORMAL LOW (ref 36.0–46.0)
Hemoglobin: 7.4 g/dL — ABNORMAL LOW (ref 12.0–15.0)
Hemoglobin: 8 g/dL — ABNORMAL LOW (ref 12.0–15.0)
Immature Granulocytes: 1 %
Lymphocytes Relative: 22 %
Lymphocytes Relative: 25 %
Lymphs Abs: 3.1 10*3/uL (ref 0.7–4.0)
Lymphs Abs: 1.8 10*3/uL (ref 0.7–4.0)
MCH: 26 pg (ref 26.0–34.0)
MCH: 26.2 pg (ref 26.0–34.0)
MCHC: 33.6 g/dL (ref 30.0–36.0)
MCHC: 33.2 g/dL (ref 30.0–36.0)
MCV: 77.2 fL — ABNORMAL LOW (ref 80.0–100.0)
MCV: 79 fL — ABNORMAL LOW (ref 80.0–100.0)
Monocytes Absolute: 1 10*3/uL (ref 0.1–1.0)
Monocytes Absolute: 0.1 10*3/uL (ref 0.1–1.0)
Monocytes Relative: 7 %
Monocytes Relative: 2 %
Neutro Abs: 9.8 10*3/uL — ABNORMAL HIGH (ref 1.7–7.7)
Neutro Abs: 5 10*3/uL (ref 1.7–7.7)
Neutrophils Relative %: 69 %
Neutrophils Relative %: 72 %
Platelets: 56 10*3/uL — ABNORMAL LOW (ref 150–400)
Platelets: 55 10*3/uL — ABNORMAL LOW (ref 150–400)
RBC: 2.85 MIL/uL — ABNORMAL LOW (ref 3.87–5.11)
RBC: 3.05 MIL/uL — ABNORMAL LOW (ref 3.87–5.11)
RDW: 18.6 % — ABNORMAL HIGH (ref 11.5–15.5)
RDW: 18.6 % — ABNORMAL HIGH (ref 11.5–15.5)
WBC: 14.1 10*3/uL — ABNORMAL HIGH (ref 4.0–10.5)
WBC: 7 10*3/uL (ref 4.0–10.5)
nRBC: 0 % (ref 0.0–0.2)
nRBC: 0 % (ref 0.0–0.2)
nRBC: 1 /100 WBC — ABNORMAL HIGH

## 2020-08-19 LAB — BASIC METABOLIC PANEL
Anion gap: 7 (ref 5–15)
BUN: <5 mg/dL — ABNORMAL LOW (ref 8–23)
CO2: 19 mmol/L — ABNORMAL LOW (ref 22–32)
Calcium: 7.6 mg/dL — ABNORMAL LOW (ref 8.9–10.3)
Chloride: 112 mmol/L — ABNORMAL HIGH (ref 98–111)
Creatinine, Ser: 1.19 mg/dL — ABNORMAL HIGH (ref 0.44–1.00)
GFR calc Af Amer: 57 mL/min — ABNORMAL LOW (ref >60)
GFR calc non Af Amer: 49 mL/min — ABNORMAL LOW (ref >60)
Glucose, Bld: 115 mg/dL — ABNORMAL HIGH (ref 70–99)
Potassium: 4.5 mmol/L (ref 3.5–5.1)
Sodium: 138 mmol/L (ref 135–145)

## 2020-08-19 LAB — GLUCOSE, CAPILLARY
Glucose-Capillary: 91 mg/dL (ref 70–99)
Glucose-Capillary: 99 mg/dL (ref 70–99)
Glucose-Capillary: 196 mg/dL — ABNORMAL HIGH (ref 70–99)
Glucose-Capillary: 170 mg/dL — ABNORMAL HIGH (ref 70–99)

## 2020-08-19 LAB — HEMOGLOBIN A1C
Hgb A1c MFr Bld: 4.5 % — ABNORMAL LOW (ref 4.8–5.6)
Mean Plasma Glucose: 82 mg/dL

## 2020-08-19 MED ORDER — SODIUM BICARBONATE 8.4 % IV SOLN: INTRAVENOUS | Status: DC

## 2020-08-19 MED ORDER — CIPROFLOXACIN HCL 500 MG PO TABS
500.0000 mg | ORAL_TABLET | Freq: Two times a day (BID) | ORAL | Status: DC
  Administered 2020-08-19 – 2020-08-20 (×3): 500 mg via ORAL
  Filled 2020-08-19 (×3): qty 1

## 2020-08-19 MED ORDER — SODIUM BICARBONATE 8.4 % IV SOLN
INTRAVENOUS | Status: DC
  Filled 2020-08-19 (×4): qty 100

## 2020-08-19 MED ORDER — METRONIDAZOLE 500 MG PO TABS
500.0000 mg | ORAL_TABLET | Freq: Three times a day (TID) | ORAL | Status: DC
  Administered 2020-08-19 – 2020-08-20 (×3): 500 mg via ORAL
  Filled 2020-08-19 (×3): qty 1

## 2020-08-19 NOTE — Progress Notes (Signed)
PROGRESS NOTE    Amber [name]  OIB:[id number] DOB: [birth year] DOA: 08/15/2020 PCP: Sandi Mariscal, MD   Brief Narrative:  Amber [name] is a 64 y.o. female with medical history significant of recurrent UTIs, CVA, diastolic CHF, asthma, diabetes, GERD, hypertension, seizure disorder and previous small bowel obstruction who presented to the ER with sudden onset of fever chills diarrhea and dysuria for the last 3 to 4 days.  She has had similar episodes in the past when she has had UTIs.  Upon arrival to ED, temperature was 100.4.  Pulse 140 and respiration 30.  Oxygen saturation was normal and blood pressure was normal as well.  White cells were 17.5. CT abdomen pelvis showed suspected an inflammatory or infectious colitis.  Urinalysis shows hazy urine with a large leukocytes.  WBC more than 50. Patient found to be having sepsis due to possible colitis.  She was started on antibiotics and admitted to hospital service.  Biotics were continued.  C. difficile tested negative.   Assessment & Plan:   Principal Problem:   Sepsis due to gram-negative UTI ([place]) Active Problems:   History of seizure   Diabetes mellitus type 2, controlled ([place])   Failure to thrive in adult   Chronic diastolic (congestive) heart failure (H[facility])   CKD (chronic kidney disease) stage 3, GFR 30-59 ml/min   Chronic anticoagulation   UTI (urinary tract infection)   Colitis, acute   #1 sepsis secondary to acute colitis: Patient met sepsis criteria based on tachypnea, tachycardia and leukocytosis of 7.5.  Source of infection acute colitis based on the CT scan.  Patient was having diarrhea prior to hospitalization but she has been having solid BMs x1/day here.  Today C. difficile was tested and she is negative.  She finally feels much better and did not have any complaint today.  No abdominal pain.  No diarrhea.  Sepsis physiology improving.  Now we will switch her to oral antibiotics, Flagyl and ciprofloxacin.  For some reason, she  has leukocytosis now.  Will recheck CBC.  #2  Uncontrolled diabetes mellitus type II with hypoglycemia: Last hemoglobin A1c 4.4 about 5 months ago and 4.5 again today.  No more hypoglycemia.  Discontinue SSI.  #3 history of CVA: Stable.  Continue home regimen.  #4 history of seizure disorder: We will continue home regimen.  #5 chronic diastolic heart failure: Euvolemic.  Continue to monitor closely.  #6  Acute kidney injury: Likely due to dehydration.  Resolved.  Continue gentle hydration.  #7  Hypokalemia: Resolved  #8 chronic thrombocytopenia: Slightly lower again.  Likely due to sepsis.  Hemoglobin is stable.  No signs of bleeding.  #9.  Asymptomatic bacteriuria: Patient does not have any urinary complaints and her UA is not overly consistent with UTI.  Does not need treatment.  #10 hypomagnesemia: Resolved.  #11 chronic microcytic anemia.  Hemoglobin dropped to 7.4 today.  Recheck CBC today.  DVT prophylaxis:    Code Status: Full Code  Family Communication:  None present at bedside.  Plan of care discussed with patient in length and he verbalized understanding and agreed with it.  Status is: Inpatient  Remains inpatient appropriate because:Inpatient level of care appropriate due to severity of illness   Dispo: The patient is from: Home              Anticipated d/c is to: snf              Anticipated d/c date is: 2 days  Patient currently is medically stable to d/c.        Estimated body mass index is 23.44 kg/m as calculated from the following:   Height as of this encounter: 5' (1.524 m).   Weight as of this encounter: 54.4 kg.  Pressure Injury 10/10/18 Stage II -  Partial thickness loss of dermis presenting as a shallow open ulcer with a red, pink wound bed without slough. (Active)  10/10/18 2230  Location: Groin  Location Orientation: Left  Staging: Stage II -  Partial thickness loss of dermis presenting as a shallow open ulcer with a red,  pink wound bed without slough.  Wound Description (Comments):   Present on Admission: Yes     Pressure Injury 10/10/18 Stage II -  Partial thickness loss of dermis presenting as a shallow open ulcer with a red, pink wound bed without slough. (Active)  10/10/18 2230  Location: Sacrum  Location Orientation: Mid  Staging: Stage II -  Partial thickness loss of dermis presenting as a shallow open ulcer with a red, pink wound bed without slough.  Wound Description (Comments):   Present on Admission: Yes     Pressure Injury 10/10/18 Stage II -  Partial thickness loss of dermis presenting as a shallow open ulcer with a red, pink wound bed without slough. (Active)  10/10/18 2230  Location: Buttocks  Location Orientation: Right  Staging: Stage II -  Partial thickness loss of dermis presenting as a shallow open ulcer with a red, pink wound bed without slough.  Wound Description (Comments):   Present on Admission: Yes     Pressure Injury 10/10/18 Stage II -  Partial thickness loss of dermis presenting as a shallow open ulcer with a red, pink wound bed without slough. (Active)  10/10/18 2230  Location: Buttocks  Location Orientation: Left  Staging: Stage II -  Partial thickness loss of dermis presenting as a shallow open ulcer with a red, pink wound bed without slough.  Wound Description (Comments):   Present on Admission: Yes     Pressure Injury 12/29/18 Stage I -  Intact skin with non-blanchable redness of a localized area usually over a bony prominence. (Active)  12/29/18 2040  Location: Sacrum  Location Orientation: Medial  Staging: Stage I -  Intact skin with non-blanchable redness of a localized area usually over a bony prominence.  Wound Description (Comments):   Present on Admission: Yes     Nutritional status:               Consultants:   None  Procedures:   None  Antimicrobials:  Anti-infectives (From admission, onward)   Start     Dose/Rate Route Frequency  Ordered Stop   08/15/20 2115  cefTRIAXone (ROCEPHIN) 2 g in sodium chloride 0.9 % 100 mL IVPB        2 g 200 mL/hr over 30 Minutes Intravenous Every 24 hours 08/15/20 2108     08/15/20 2115  metroNIDAZOLE (FLAGYL) IVPB 500 mg        500 mg 100 mL/hr over 60 Minutes Intravenous Every 8 hours 08/15/20 2108     08/15/20 1615  metroNIDAZOLE (FLAGYL) IVPB 500 mg        500 mg 100 mL/hr over 60 Minutes Intravenous  Once 08/15/20 1611 08/15/20 2022   08/15/20 1615  cefTRIAXone (ROCEPHIN) 1 g in sodium chloride 0.9 % 100 mL IVPB        1 g 200 mL/hr over 30 Minutes Intravenous  Once 08/15/20 1611  08/15/20 1850   08/15/20 1500  cefTRIAXone (ROCEPHIN) 1 g in sodium chloride 0.9 % 100 mL IVPB        1 g 200 mL/hr over 30 Minutes Intravenous  Once 08/15/20 1455 08/15/20 1802         Subjective: Seen and examined.  For the first time in last few days, she stated that she is feeling much better without having any complaints.  Objective: Vitals:   08/18/20 2013 08/19/20 0452 08/19/20 0852 08/19/20 1041  BP: (!) 144/120 92/77 (!) 87/63 (!) 82/61  Pulse: 74 84 87 83  Resp: _0 Temp: 97.8 F (36.6 C) 97.8 F (36.6 C) 97.8 F (36.6 C) 98.6 F (37 C)  TempSrc: Oral  Oral Oral  SpO2: 97% 98% 96% 98%  Weight:      Height:        Intake/Output Summary (Last 24 hours) at 08/19/2020 1305 Last data filed at 08/19/2020 0900 Gross per 24 hour  Intake 1890.42 ml  Output 500 ml  Net 1390.42 ml   Filed Weights   08/15/20 1214  Weight: 54.4 kg    Examination:  General exam: Appears calm and comfortable  Respiratory system: Clear to auscultation. Respiratory effort normal. Cardiovascular system: S1 & S2 heard, RRR. No JVD, murmurs, rubs, gallops or clicks. No pedal edema. Gastrointestinal system: Abdomen is nondistended, soft and nontender. No organomegaly or masses felt. Normal bowel sounds heard. Central nervous system: Alert and oriented. No focal neurological  deficits. Extremities: Symmetric 5 x 5 power. Skin: No rashes, lesions or ulcers.  Psychiatry: Judgement and insight appear normal. Mood & affect appropriate.   Data Reviewed: I have personally reviewed following labs and imaging studies  CBC: Recent Labs  Lab 08/15/20 1252 08/16/20 0754 08/17/20 0830 08/18/20 0156 08/19/20 0450  WBC 17.5* 10.5 7.4 9.7 14.1*  NEUTROABS  --   --  4.5 8.8* 9.8*  HGB 10.3* 8.4* 9.1* 8.5* 7.4*  HCT 31.6* 26.5* 27.6* 25.0* 22.0*  MCV 82.3 83.3 79.3* 76.2* 77.2*  PLT 87* 73* 66* 50* 56*   Basic Metabolic Panel: Recent Labs  Lab 08/15/20 1252 08/16/20 0754 08/17/20 0830 08/18/20 0156  NA 139 141 138 140  K 3.3* 2.9* 3.2* 4.1  CL 112* 117* 114* 113*  CO2 15* 16* 15* 15*  GLUCOSE 100* 65* 247* 84  BUN 10 9 6* 6*  CREATININE 1.10* 0.91 0.92 0.99  CALCIUM 8.0* 7.7* 7.6* 7.6*  MG  --   --  1.3* 2.3   GFR: Estimated Creatinine Clearance: 42.3 mL/min (by C-G formula based on SCr of 0.99 mg/dL). Liver Function Tests: Recent Labs  Lab 08/15/20 1252 08/16/20 0754 08/17/20 0830 08/18/20 0156  AST _1 ALT _2 ALKPHOS 99 82 85 95  BILITOT 0.6 0.8 0.6 0.5  PROT 5.7* 4.7* 4.6* 4.7*  ALBUMIN 2.1* 1.7* 1.6* 1.6*   Recent Labs  Lab 08/15/20 1252  LIPASE 22   No results for input(s): AMMONIA in the last 168 hours. Coagulation Profile: Recent Labs  Lab 08/15/20 1419 08/16/20 0754  INR 1.3* 1.7*   Cardiac Enzymes: No results for input(s): CKTOTAL, CKMB, CKMBINDEX, TROPONINI in the last 168 hours. BNP (last 3 results) No results for input(s): PROBNP in the last 8760 hours. HbA1C: Recent Labs    08/18/20 0156  HGBA1C 4.5*   CBG: Recent Labs  Lab 08/18/20 1144 08/18/20 1656 08/18/20 2014 08/19/20 0645 08/19/20 1128  GLUCAP 109* 142*  121* 91 99   Lipid Profile: No results for input(s): CHOL, HDL, LDLCALC, TRIG, CHOLHDL, LDLDIRECT in the last 72 hours. Thyroid Function Tests: No results for input(s): TSH,  T4TOTAL, FREET4, T3FREE, THYROIDAB in the last 72 hours. Anemia Panel: No results for input(s): VITAMINB12, FOLATE, FERRITIN, TIBC, IRON, RETICCTPCT in the last 72 hours. Sepsis Labs: Recent Labs  Lab 08/15/20 1419 08/16/20 0754  PROCALCITON  --  0.10  LATICACIDVEN 1.5  --     Recent Results (from the past 240 hour(s))  Urine culture     Status: None   Collection Time: 08/15/20  2:17 PM   Specimen: Urine, Random  Result Value Ref Range Status   Specimen Description URINE, RANDOM  Final   Special Requests NONE  Final   Culture   Final    NO GROWTH Performed at [facility], [address] [place], [place] [address]    Report Status 08/16/2020 FINAL  Final  Blood culture (routine single)     Status: None (Preliminary result)   Collection Time: 08/15/20  2:19 PM   Specimen: BLOOD LEFT HAND  Result Value Ref Range Status   Specimen Description BLOOD LEFT HAND  Final   Special Requests   Final    BOTTLES DRAWN AEROBIC AND ANAEROBIC Blood Culture results may not be optimal due to an inadequate volume of blood received in culture bottles   Culture   Final    NO GROWTH 4 DAYS Performed at [facility], [place] [address] [place], [place] [address]    Report Status PENDING  Incomplete  Respiratory Panel by RT PCR (Flu A&B, Covid) - Urine, Catheterized     Status: None   Collection Time: 08/15/20  2:19 PM   Specimen: Urine, Catheterized; Nasopharyngeal  Result Value Ref Range Status   SARS Coronavirus 2 by RT PCR NEGATIVE NEGATIVE Final    Comment: (NOTE) SARS-CoV-2 target nucleic acids are NOT DETECTED.  The SARS-CoV-2 RNA is generally detectable in upper respiratoy specimens during the acute phase of infection. The lowest concentration of SARS-CoV-2 viral copies this assay can detect is 131 copies/mL. A negative result does not preclude SARS-Cov-2 infection and should not be used as the sole basis for treatment or other patient management decisions. A negative  result may occur with  improper specimen collection/handling, submission of specimen other than nasopharyngeal swab, presence of viral mutation(s) within the areas targeted by this assay, and inadequate number of viral copies (<131 copies/mL). A negative result must be combined with clinical observations, patient history, and epidemiological information. The expected result is Negative.  Fact Sheet for Patients:  PinkCheek.be  Fact Sheet for Healthcare Providers:  GravelBags.it  This test is no t yet approved or cleared by the [place] FDA and  has been authorized for detection and/or diagnosis of SARS-CoV-2 by FDA under an Emergency Use Authorization (EUA). This EUA will remain  in effect (meaning this test can be used) for the duration of the COVID-19 declaration under Section 564(b)(1) of the Act, 21 U.S.C. section 360bbb-3(b)(1), unless the authorization is terminated or revoked sooner.     Influenza A by PCR NEGATIVE NEGATIVE Final   Influenza B by PCR NEGATIVE NEGATIVE Final    Comment: (NOTE) The Xpert Xpress SARS-CoV-2/FLU/RSV assay is intended as an aid in  the diagnosis of influenza from Nasopharyngeal swab specimens and  should not be used as a sole basis for treatment. Nasal washings and  aspirates are unacceptable for Xpert Xpress  SARS-CoV-2/FLU/RSV  testing.  Fact Sheet for Patients: PinkCheek.be  Fact Sheet for Healthcare Providers: GravelBags.it  This test is not yet approved or cleared by the [place] FDA and  has been authorized for detection and/or diagnosis of SARS-CoV-2 by  FDA under an Emergency Use Authorization (EUA). This EUA will remain  in effect (meaning this test can be used) for the duration of the  Covid-19 declaration under Section 564(b)(1) of the Act, 21  U.S.C. section 360bbb-3(b)(1), unless the authorization is   terminated or revoked. Performed at [facility], [place] [address], [place], [place] [address]   Culture, blood (routine x 2)     Status: None (Preliminary result)   Collection Time: 08/15/20  2:23 PM   Specimen: BLOOD  Result Value Ref Range Status   Specimen Description BLOOD LEFT ANTECUBITAL  Final   Special Requests   Final    BOTTLES DRAWN AEROBIC AND ANAEROBIC Blood Culture results may not be optimal due to an inadequate volume of blood received in culture bottles   Culture   Final    NO GROWTH 4 DAYS Performed at [facility], [place] [address], [place], [place] [address]    Report Status PENDING  Incomplete  C Difficile Quick Screen w PCR reflex     Status: None   Collection Time: 08/18/20  8:27 AM   Specimen: STOOL  Result Value Ref Range Status   C Diff antigen NEGATIVE NEGATIVE Final   C Diff toxin NEGATIVE NEGATIVE Final   C Diff interpretation No C. difficile detected.  Final    Comment: Performed at [facility], [place] [address], [place], [place] [address]      Radiology Studies: No results found.  Scheduled Meds: . amLODipine  10 mg Oral Daily  . apixaban  5 mg Oral BID  . atorvastatin  10 mg Oral q1800  . folic acid  1 mg Oral Daily  . insulin aspart  0-5 Units Subcutaneous QHS  . insulin aspart  0-9 Units Subcutaneous TID WC  . lipase/protease/amylase  12,000 Units Oral TID AC  . metoprolol succinate  25 mg Oral Daily  . oxyCODONE  20 mg Oral Q6H  . pantoprazole  40 mg Oral Daily  . predniSONE  5 mg Oral Q breakfast  . valproic acid  250 mg Oral TID   Continuous Infusions: . sodium chloride Stopped (08/18/20 2334)  . cefTRIAXone (ROCEPHIN)  IV Stopped (08/18/20 2211)  . metronidazole 100 mL/hr at 08/19/20 9242  . sodium bicarbonate in D5W 1000 mL infusion 100 mL/hr at 08/19/20 1049     LOS: 4 days   Time spent: 28 minutes   Darliss Cheney, MD [facility]  08/19/2020, 1:05 PM   To contact the attending provider between  7A-7P or the covering provider during after hours 7P-7A, please log into the web site [email or web address].

## 2020-08-19 NOTE — TOC Initial Note (Signed)
Transition of Care [facility]) - Initial/Assessment Note    Patient Details  Name: Amber [name] MRN: [id number] Date of Birth: 1956/08/08  Transition of Care [facility] CM/SW Contact:    Cristobal Goldmann, LCSW Phone Number: 08/19/2020, 5:11 PM  Clinical Narrative:  Talked with patient at the bedside regarding her discharge disposition and recommendation of going to a skilled nursing facility for nursing services. Patient was sitting up in bed and was agreeable to talking with CSW. She also gave CSW permission to contact her daughter-Amber [name] - [phone] and son Amber [name] - [phone]. Patient expressed agreement with going to a facility and was provided with the Medicare.gov list. When asked patient reported that she has had one COVID vaccination (Moderna). She added that her daughter took her to get the vaccination.  3:42 pm: Contacted daughter Geroge [name] regarding patient's discharge plan and SNF recommendation. Daughter indicated that her mother will be discharging home. She confirmed that her mother's aide comes 2 hours per day, 7 days per week and family will be with her the other times. When asked, daughter no aware of any HH services and advised that this will be looked into and she will be advised, and daughter in agreement.  Consulted with nurse case manager Wendi, and call was made to [place] [facility] for PT services. Referral is currently pending for [facility], and if able to accept patient, services will start late next week per Wendi.                  Expected Discharge Plan: Skilled Nursing Facility Barriers to Discharge: Continued Medical Work up   Patient Goals and CMS Choice Patient states their goals for this hospitalization and ongoing recovery are:: Patient wants to get better before going home CMS Medicare.gov Compare Post Acute Care list provided to:: Patient Choice offered to / list presented to : Patient (List provided)  Expected Discharge Plan and  Services Expected Discharge Plan: Skilled Nursing Facility In-house Referral: Clinical Social Work Discharge Planning Services: Other - See comment (Home Health PT needed)   Living arrangements for the past 2 months: Apartment (Granddaughter [name] lives with patient)                   D[facility] Agency: [facility]         HH [facility] Date HH Agency Contacted: 08/19/20      Prior Living Arrangements/Services Living arrangements for the past 2 months: Apartment (Granddaughter [name] lives with patient) Lives with:: Relatives (Granddaughter Production assistant, radio) Patient language and need for interpreter reviewed:: No Do you feel safe going back to the place where you live?: Yes (Daughter feels comfortable with patient returning home)      Need for Family Participation in Patient Care: Yes (Comment) Care giver support system in place?: Yes (comment) Current home services: Homehealth aide (Patient has an aide that comes 7 days a week, 2 hours a day) Criminal Activity/Legal Involvement Pertinent to Current Situation/Hospitalization: No - Comment as needed  Activities of Daily Living   ADL Screening (condition at time of admission) Patient's cognitive ability adequate to safely complete daily activities?: No Is the patient deaf or have difficulty hearing?: No Does the patient have difficulty seeing, even when wearing glasses/contacts?: No Does the patient have difficulty concentrating, remembering, or making decisions?: No Patient able to express need for assistance with ADLs?: Yes Does the patient have difficulty dressing or bathing?: Yes Independently  performs ADLs?: No Communication: Independent Dressing (OT): Needs assistance Is this a change from baseline?: Pre-admission baseline Grooming: Needs assistance Is this a change from baseline?: Pre-admission baseline Feeding: Needs assistance Is this a change from  baseline?: Pre-admission baseline Bathing: Needs assistance Is this a change from baseline?: Pre-admission baseline Toileting: Dependent Is this a change from baseline?: Pre-admission baseline Walks in Home: Dependent Is this a change from baseline?: Pre-admission baseline Does the patient have difficulty walking or climbing stairs?: Yes Weakness of Legs: None Weakness of Arms/Hands: None  Permission Sought/Granted Permission sought to share information with : Family Supports Permission granted to share information with : Yes, Verbal Permission Granted  Share Information with NAME: Latorya Bautch     Permission granted to share info w Relationship: Daughter  Permission granted to share info w Contact Information: [phone]  Emotional Assessment Appearance:: Appears older than stated age Attitude/Demeanor/Rapport: Engaged   Orientation: : Oriented to Self, Oriented to Place, Oriented to  Time, Oriented to Situation Alcohol / Substance Use: Tobacco Use, Alcohol Use, Illicit Drugs (Per H&P patient quit smoking, no current alcohol use and no illicit drug use) Psych Involvement: No (comment)  Admission diagnosis:  Colitis [K52.9] Fever, unspecified [R50.9] Urinary tract infection without hematuria, site unspecified [N39.0] Sepsis due to gram-negative UTI (HCC) [A41.50, N39.0] Sepsis without acute organ dysfunction, due to unspecified organism [facility]) [A41.9] Patient Active Problem List   Diagnosis Date Noted  . Colitis, acute 08/16/2020  . Sepsis due to gram-negative UTI (HCC) 08/15/2020  . UTI (urinary tract infection) 03/25/2020  . SIRS (systemic inflammatory response syndrome) (HCC) 03/25/2020  . Abdominal pain 03/25/2020  . Diarrhea 03/25/2020  . CVA (cerebral vascular accident) (HCC) 02/13/2020  . Hyperkalemia   . Stroke (HCC) 12/25/2019  . Acute ischemic stroke (HCC) 12/24/2019  . Chronic diastolic (congestive) heart failure (HCC) 02/05/2019  . CKD (chronic kidney  disease) stage 3, GFR 30-59 ml/min 02/05/2019  . Chronic anticoagulation 02/05/2019  . Palliative care encounter   . Septic shock (HCC) 12/29/2018  . Pressure injury of skin 10/11/2018  . Sepsis due to pneumonia (HCC) 10/10/2018  . Failure to thrive in adult 10/10/2018  . Rheumatoid arthritis (HCC) 10/10/2018  . Anasarca 10/10/2018  . HCAP (healthcare-associated pneumonia) 09/24/2018  . Acute metabolic encephalopathy 05/09/2018  . Metabolic acidosis 05/09/2018  . Mild persistent asthma 05/09/2018  . 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 Moderate malnutrition (HCC) 05/07/2018  . Chronic anemia 05/06/2018  . PFO (patent foramen ovale) 02/15/2017  . History of CVA with residual deficit 02/15/2017  . Drug-induced hyperglycemia 02/15/2017  . Chest pain 11/23/2016  . Seizures (HCC) 10/26/2016  . 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 Sepsis (HCC) 02/29/2016  . Constipation 11/07/2015  . Hypomagnesemia 08/25/2015  . Diabetes mellitus type 2, controlled (HCC) 07/27/2015  . Chronic back pain   . Frequent falls   . Hypertensive heart disease   . Pulmonary hypertension (HCC)   . Gout   . Microcytic anemia 01/26/2015  . History of seizure 01/26/2015   PCP:  [name], MD Pharmacy:   [id number] - [place], [place] - [address] [place] [address] Phone: [phone] Fax: [phone]    Social Determinants of Health (SDOH) Interventions  None needed at this time   Readmission Risk Interventions No flowsheet data found.

## 2020-08-19 NOTE — Plan of Care (Signed)
  Problem: Nutrition: Goal: Adequate nutrition will be maintained Outcome: Progressing   

## 2020-08-19 NOTE — Progress Notes (Signed)
Physical Therapy Treatment Patient Details Name: Amber [name] MRN: [id number] DOB: [birth year] Today's Date: 08/19/2020    History of Present Illness Patient is a 64 y/o female who presents with fever, chills, diarrhea and dysuria. Admitted with sepsis secondary to recurrent UTIs and possible colitis. PMh includes CAD, asthma, DM, stroke, frequent falls, HTN, seizures, RA.    PT Comments    The pt was able to tolerate progression to OOB mobility and transfers at this time, but required significant assist of 2 due to weakness and pain in both LE at this time. The pt was incontinent of bowels upon standing today, and required maxA with use of RW to stand from an elevated EOB x3 to allow for cleaning prior to pivot to recliner. The pt was educated on a series of AROM exercises for her LE to reduce arthritis-related stiffness and pain with future mobility and she verbalized understanding. The pt states her granddaughter (caregiver) is usually able to "lifft her up" as needed for transfers, but I feel a short stint of SNF rehab may be beneficial for reduced assistance needed for transfers prior to return home as the pt currently requires assist of 2 to safely complete transfers. The pt will continue to benefit from skilled PT acutely to progress functional strength, ROM, and tolerance for transfers to reduce caregiver burden prior to return home.     Follow Up Recommendations  SNF;Supervision for mobility/OOB (may progress to return home with HHPT)     Equipment Recommendations  None recommended by PT    Recommendations for Other Services       Precautions / Restrictions Precautions Precautions: Fall Restrictions Weight Bearing Restrictions: No    Mobility  Bed Mobility Overal bed mobility: Needs Assistance Bed Mobility: Supine to Sit     Supine to sit: Mod assist;+2 for safety/equipment;HOB elevated     General bed mobility comments: modA to move BLE to EOB (pain-limiting, but pt  attempting to assist), modA to elevate trunk and scoot to EOB, pt able to maintain with minA progressing to minG  Transfers Overall transfer level: Needs assistance Equipment used: Rolling walker (2 wheeled) Transfers: Sit to/from [employer] Sit to Stand: Max assist;+2 physical assistance;From elevated surface Stand pivot transfers: Max assist;+2 physical assistance       General transfer comment: pt requires significant assist due to poor functional strength in BLE and limited ROM at her knees due to pain. maxA to stand from elevated EOB, maxA of 1 to maintain standing. maxA to complete pivot to recliner (may benefit from use of stedy with RN staff)  Ambulation/Gait             General Gait Details: pt unable, reports limited to transfers at baseline      Balance Overall balance assessment: Needs assistance Sitting-balance support: Single extremity supported;Feet supported Sitting balance-Leahy Scale: Fair Sitting balance - Comments: progressed from minA to minG   Standing balance support: Bilateral upper extremity supported Standing balance-Leahy Scale: Poor Standing balance comment: maxA and RW to maintain upright for pericare                            Cognition Arousal/Alertness: Awake/alert Behavior During Therapy: WFL for tasks assessed/performed Overall Cognitive Status: Impaired/Different from baseline Area of Impairment: Attention;Memory;Awareness;Problem solving;Following commands;Safety/judgement                   Current Attention Level: Focused   Following Commands: Follows one  step commands with increased time Safety/Judgement: Decreased awareness of safety;Decreased awareness of deficits Awareness: Intellectual Problem Solving: Slow processing;Requires verbal cues;Decreased initiation;Difficulty sequencing;Requires tactile cues General Comments: Pt with good motivation today, all statements/answers regarding prior  level of function or mobility align with information in chart. Requires cues for sequencing, motivation for transfers      Exercises General Exercises - Lower Extremity Ankle Circles/Pumps: AAROM;Both;10 reps;Supine Heel Slides: AAROM;Both;5 reps;Supine (very limited ROM)    General Comments General comments (skin integrity, edema, etc.): VSS, educated on gentle ROM for BLE and UE due to reports of significant arthritis pain      Pertinent Vitals/Pain Pain Assessment: Faces Faces Pain Scale: Hurts whole lot Pain Location: BLE (significant arthritis at B knees), and shoulders Pain Descriptors / Indicators: Discomfort;Sore;Aching;Moaning;Crying Pain Intervention(s): Monitored during session;Premedicated before session;Repositioned;Limited activity within patient's tolerance           PT Goals (current goals can now be found in the care plan section) Acute Rehab PT Goals Patient Stated Goal: to get to the chair PT Goal Formulation: With patient Time For Goal Achievement: 08/31/20 Potential to Achieve Goals: Fair Progress towards PT goals: Progressing toward goals    Frequency    Min 3X/week      PT Plan Current plan remains appropriate       AM-PAC PT "6 Clicks" Mobility   Outcome Measure  Help needed turning from your back to your side while in a flat bed without using bedrails?: A Lot Help needed moving from lying on your back to sitting on the side of a flat bed without using bedrails?: A Lot Help needed moving to and from a bed to a chair (including a wheelchair)?: A Lot Help needed standing up from a chair using your arms (e.g., wheelchair or bedside chair)?: A Lot Help needed to walk in hospital room?: Total Help needed climbing 3-5 steps with a railing? : Total 6 Click Score: 10    End of Session Equipment Utilized During Treatment: Gait belt Activity Tolerance: Patient limited by pain Patient left: with call bell/phone within reach;in chair;with chair  alarm set Nurse Communication: Mobility status;Need for lift equipment (may benefit from stedy for pivot back to bed) PT Visit Diagnosis: Pain;Muscle weakness (generalized) (M62.81) Pain - part of body:  (BLE)     Time: 5784-6962 PT Time Calculation (min) (ACUTE ONLY): 25 min  Charges:  $Gait Training: 8-22 mins $Therapeutic Activity: 8-22 mins                     Rolm Baptise, PT, DPT   Acute Rehabilitation Department Pager #: [phone]   Gaetana Michaelis 08/19/2020, 2:29 PM

## 2020-08-20 LAB — CULTURE, BLOOD (ROUTINE X 2): Culture: NO GROWTH

## 2020-08-20 LAB — CBC WITH DIFFERENTIAL/PLATELET
Abs Immature Granulocytes: 0.16 10*3/uL — ABNORMAL HIGH (ref 0.00–0.07)
Basophils Absolute: 0 10*3/uL (ref 0.0–0.1)
Basophils Relative: 0 %
Eosinophils Absolute: 0.1 10*3/uL (ref 0.0–0.5)
Eosinophils Relative: 1 %
HCT: 21.4 % — ABNORMAL LOW (ref 36.0–46.0)
Hemoglobin: 7.4 g/dL — ABNORMAL LOW (ref 12.0–15.0)
Immature Granulocytes: 1 %
Lymphocytes Relative: 11 %
Lymphs Abs: 1.4 10*3/uL (ref 0.7–4.0)
MCH: 26.8 pg (ref 26.0–34.0)
MCHC: 34.6 g/dL (ref 30.0–36.0)
MCV: 77.5 fL — ABNORMAL LOW (ref 80.0–100.0)
Monocytes Absolute: 1.4 10*3/uL — ABNORMAL HIGH (ref 0.1–1.0)
Monocytes Relative: 11 %
Neutro Abs: 10 10*3/uL — ABNORMAL HIGH (ref 1.7–7.7)
Neutrophils Relative %: 76 %
Platelets: 51 10*3/uL — ABNORMAL LOW (ref 150–400)
RBC: 2.76 MIL/uL — ABNORMAL LOW (ref 3.87–5.11)
RDW: 17.9 % — ABNORMAL HIGH (ref 11.5–15.5)
WBC: 13.2 10*3/uL — ABNORMAL HIGH (ref 4.0–10.5)
nRBC: 0 % (ref 0.0–0.2)

## 2020-08-20 LAB — CULTURE, BLOOD (SINGLE): Culture: NO GROWTH

## 2020-08-20 LAB — GLUCOSE, CAPILLARY
Glucose-Capillary: 137 mg/dL — ABNORMAL HIGH (ref 70–99)
Glucose-Capillary: 114 mg/dL — ABNORMAL HIGH (ref 70–99)

## 2020-08-20 MED ORDER — CIPROFLOXACIN HCL 500 MG PO TABS: 500.0000 mg | ORAL_TABLET | Freq: Two times a day (BID) | ORAL | 0 refills | Status: AC

## 2020-08-20 MED ORDER — METRONIDAZOLE 500 MG PO TABS: 500.0000 mg | ORAL_TABLET | Freq: Three times a day (TID) | ORAL | 0 refills | Status: AC

## 2020-08-20 NOTE — Plan of Care (Signed)
  Problem: Activity: Goal: Risk for activity intolerance will decrease Outcome: Progressing   

## 2020-08-20 NOTE — Plan of Care (Signed)
  Problem: Nutrition: Goal: Adequate nutrition will be maintained Outcome: Progressing   

## 2020-08-20 NOTE — Discharge Instructions (Signed)
Colitis  Colitis is inflammation of the colon. Colitis may last a short time (be acute), or it may last a long time (become chronic). What are the causes? This condition may be caused by:  Viruses.  Bacteria.  Reaction to medicine.  Certain autoimmune diseases such as Crohn's disease or ulcerative colitis.  Radiation treatment.  Decreased blood flow to the bowel (ischemia). What are the signs or symptoms? Symptoms of this condition include:  Watery diarrhea.  Passing bloody or tarry stool.  Pain.  Fever.  Vomiting.  Tiredness (fatigue).  Weight loss.  Bloating.  Abdominal pain.  Having fewer bowel movements than usual.  A strong and sudden urge to have a bowel movement.  Feeling like the bowel is not empty after a bowel movement. How is this diagnosed? This condition is diagnosed with a stool test or a blood test. You may also have other tests, such as:  X-rays.  CT scan.  Colonoscopy.  Endoscopy.  Biopsy. How is this treated? Treatment for this condition depends on the cause. The condition may be treated by:  Resting the bowel. This involves not eating or drinking for a period of time.  Fluids that are given through an IV.  Medicine for pain and diarrhea.  Antibiotic medicines.  Cortisone medicines.  Surgery. Follow these instructions at home: Eating and drinking   Follow instructions from your health care provider about eating or drinking restrictions.  Drink enough fluid to keep your urine pale yellow.  Work with a dietitian to determine which foods cause your condition to flare up.  Avoid foods that cause flare-ups.  Eat a well-balanced diet. General instructions  If you were prescribed an antibiotic medicine, take it as told by your health care provider. Do not stop taking the antibiotic even if you start to feel better.  Take over-the-counter and prescription medicines only as told by your health care provider.  Keep all  follow-up visits as told by your health care provider. This is important. Contact a health care provider if:  Your symptoms do not go away.  You develop new symptoms. Get help right away if you:  Have a fever that does not go away with treatment.  Develop chills.  Have extreme weakness, fainting, or dehydration.  Have repeated vomiting.  Develop severe pain in your abdomen.  Pass bloody or tarry stool. Summary  Colitis is inflammation of the colon. Colitis may last a short time (be acute), or it may last a long time (become chronic).  Treatment for this condition depends on the cause and may include resting the bowel, taking medicines, or having surgery.  If you were prescribed an antibiotic medicine, take it as told by your health care provider. Do not stop taking the antibiotic even if you start to feel better.  Get help right away if you develop severe pain in your abdomen.  Keep all follow-up visits as told by your health care provider. This is important. This information is not intended to replace advice given to you by your health care provider. Make sure you discuss any questions you have with your health care provider. Document Revised: 05/09/2018 Document Reviewed: 05/09/2018 Elsevier Patient Education  2020 Elsevier Inc.   Colitis  Colitis is inflammation of the colon. Colitis may last a short time (be acute), or it may last a long time (become chronic). What are the causes? This condition may be caused by:  Viruses.  Bacteria.  Reaction to medicine.  Certain autoimmune diseases such as  Crohn's disease or ulcerative colitis.  Radiation treatment.  Decreased blood flow to the bowel (ischemia). What are the signs or symptoms? Symptoms of this condition include:  Watery diarrhea.  Passing bloody or tarry stool.  Pain.  Fever.  Vomiting.  Tiredness (fatigue).  Weight loss.  Bloating.  Abdominal pain.  Having fewer bowel movements than  usual.  A strong and sudden urge to have a bowel movement.  Feeling like the bowel is not empty after a bowel movement. How is this diagnosed? This condition is diagnosed with a stool test or a blood test. You may also have other tests, such as:  X-rays.  CT scan.  Colonoscopy.  Endoscopy.  Biopsy. How is this treated? Treatment for this condition depends on the cause. The condition may be treated by:  Resting the bowel. This involves not eating or drinking for a period of time.  Fluids that are given through an IV.  Medicine for pain and diarrhea.  Antibiotic medicines.  Cortisone medicines.  Surgery. Follow these instructions at home: Eating and drinking   Follow instructions from your health care provider about eating or drinking restrictions.  Drink enough fluid to keep your urine pale yellow.  Work with a dietitian to determine which foods cause your condition to flare up.  Avoid foods that cause flare-ups.  Eat a well-balanced diet. General instructions  If you were prescribed an antibiotic medicine, take it as told by your health care provider. Do not stop taking the antibiotic even if you start to feel better.  Take over-the-counter and prescription medicines only as told by your health care provider.  Keep all follow-up visits as told by your health care provider. This is important. Contact a health care provider if:  Your symptoms do not go away.  You develop new symptoms. Get help right away if you:  Have a fever that does not go away with treatment.  Develop chills.  Have extreme weakness, fainting, or dehydration.  Have repeated vomiting.  Develop severe pain in your abdomen.  Pass bloody or tarry stool. Summary  Colitis is inflammation of the colon. Colitis may last a short time (be acute), or it may last a long time (become chronic).  Treatment for this condition depends on the cause and may include resting the bowel, taking  medicines, or having surgery.  If you were prescribed an antibiotic medicine, take it as told by your health care provider. Do not stop taking the antibiotic even if you start to feel better.  Get help right away if you develop severe pain in your abdomen.  Keep all follow-up visits as told by your health care provider. This is important. This information is not intended to replace advice given to you by your health care provider. Make sure you discuss any questions you have with your health care provider. Document Revised: 05/09/2018 Document Reviewed: 05/09/2018 Elsevier Patient Education  2020 [employer].   =======================================================================================================================================================  Information on my medicine - ELIQUIS (apixaban)  Why was Eliquis prescribed for you? Eliquis was prescribed for you to reduce the risk of a blood clot forming that can cause a stroke.  What do You need to know about Eliquis ? Take your Eliquis TWICE DAILY - one tablet in the morning and one tablet in the evening with or without food. If you have difficulty swallowing the tablet whole please discuss with your pharmacist how to take the medication safely.  Take Eliquis exactly as prescribed by your doctor and DO NOT  stop taking Eliquis without talking to the doctor who prescribed the medication.  Stopping may increase your risk of developing a stroke.  Refill your prescription before you run out.  After discharge, you should have regular check-up appointments with your healthcare provider that is prescribing your Eliquis.  In the future your dose may need to be changed if your kidney function or weight changes by a significant amount or as you get older.  What do you do if you miss a dose? If you miss a dose, take it as soon as you remember on the same day and resume taking twice daily.  Do not take more than one dose of  ELIQUIS at the same time to make up a missed dose.  Important Safety Information A possible side effect of Eliquis is bleeding. You should call your healthcare provider right away if you experience any of the following: ? Bleeding from an injury or your nose that does not stop. ? Unusual colored urine (red or dark brown) or unusual colored stools (red or black). ? Unusual bruising for unknown reasons. ? A serious fall or if you hit your head (even if there is no bleeding).  Some medicines may interact with Eliquis and might increase your risk of bleeding or clotting while on Eliquis. To help avoid this, consult your healthcare provider or pharmacist prior to using any new prescription or non-prescription medications, including herbals, vitamins, non-steroidal anti-inflammatory drugs (NSAIDs) and supplements.  This website has more information on Eliquis (apixaban): http://www.eliquis.com/eliquis/home

## 2020-08-20 NOTE — Progress Notes (Signed)
DISCHARGE NOTE HOME [facility] Cherne to be discharged Rehab per MD order. Discussed prescriptions and follow up appointments with the patient. Prescriptions given to patient; medication list explained in detail. Patient verbalized understanding.  Skin clean, dry and intact without evidence of skin break down, no evidence of skin tears noted. IV catheter discontinued intact. Site without signs and symptoms of complications. Dressing and pressure applied. Pt denies pain at the site currently. No complaints noted.  Patient free of lines, drains, and wounds.   An After Visit Summary (AVS) was printed and given to the patient. Patient escorted via wheelchair, and discharged home via private auto.  Lorine Bears, RN

## 2020-08-20 NOTE — TOC Progression Note (Addendum)
Transition of Care [facility]) - Progression Note    Patient Details  Name: [name] MRN: [id number] Date of Birth: 09-15-1956  [facility]) CM/SW Contact  Beckie Busing, RN Phone Number: [phone]  08/20/2020, 12:49 PM  Clinical Narrative:    CM consulted for [facility] services. List has been previously provided. CM is unable to find accepting home health agency for [facility] services. MD Lauralyn Primes has been made aware and requested that daughter be made aware. CM spoke with Shauntae and made her aware that there is currently no home health set up. Daughter is ok with no home health and states they will figure it out. Transport set up via [facility] per nursing request. Transport has been set up via [facility]. Address verified with daughter. No further needs noted. CM will sign off.     Expected Discharge Plan: Skilled Nursing Facility Barriers to Discharge: No Home Care Agency will accept this patient  Expected Discharge Plan and Services Expected Discharge Plan: Skilled Nursing Facility In-house Referral: Clinical Social Work Discharge Planning Services: Other - See comment (Home Health PT needed)   Living arrangements for the past 2 months: Apartment (Granddaughter Serina Cowper lives with patient) Expected Discharge Date: 08/20/20               DME Arranged: N/A DME Agency: [facility]         HH Agency: [facility] Date HH Agency Contacted: 08/19/20       Social Determinants of Health (SDOH) Interventions    Readmission Risk Interventions No flowsheet data found.

## 2020-08-20 NOTE — Discharge Summary (Signed)
Physician Discharge Summary  Amber [name] DJS:[id number] DOB: 1956-10-01 DOA: 08/15/2020  PCP: [name], MD  Admit date: 08/15/2020 Discharge date: 08/20/2020  Admitted From: Home Disposition: Home  Recommendations for Outpatient Follow-up:  1. Follow up with PCP in 1-2 weeks 2. Please obtain BMP/CBC in one week 3. Please follow up with your PCP on the following pending results: Unresulted Labs (From admission, onward)          Start     Ordered   08/17/20 0500  CBC with Differential/Platelet  Daily,   R      08/16/20 1419           Home Health: Yes Equipment/Devices: Bedside commode   Discharge Condition: Stable CODE STATUS: Full code Diet recommendation: Cardiac  Subjective: Seen and examined.  Feels better.  No complaints.  Brief/Interim Summary: Amber Goodsonis a 64 y.o.femalewith medical history significant ofrecurrent UTIs, CVA, diastolic CHF, asthma, diabetes, GERD, hypertension, seizure disorder and previous small bowel obstruction who presented to the ER with sudden onset of fever chills diarrhea and dysuria for the last 3 to 4 days. She has had similar episodes in the past when she has had UTIs.  Upon arrival to ED, temperature was 100.4.  Pulse 140 and respiration 30.  Oxygen saturation was normal and blood pressure was normal as well.  White cells were 17.5. CT abdomen pelvis showed suspected an inflammatory or infectious colitis. Urinalysis shows hazy urine with a large leukocytes. WBC more than 50.Patient found to be having sepsis due to possible colitis.  She was started on IV antibiotics/IV Rocephin and Flagyl and admitted to hospital service.  She was tested negative for C. difficile.  Sepsis parameters improved.  She was able to tolerate regular diet.  She was then evaluated by PT OT who recommended SNF however patient and her daughter chose to go home with home health which has been arranged for her and patient is going to be discharged today in stable  condition.  She will be prescribed 5 more days of oral ciprofloxacin and Flagyl for colitis.  Cultures remain negative.  Discharge Diagnoses:  Principal Problem:   Sepsis due to gram-negative UTI [facility]) Active Problems:   History of seizure   Diabetes mellitus type 2, controlled (HCC)   Failure to thrive in adult   Chronic diastolic (congestive) heart failure (HCC)   CKD (chronic kidney disease) stage 3, GFR 30-59 ml/min   Chronic anticoagulation   UTI (urinary tract infection)   Colitis, acute    Discharge Instructions   Allergies as of 08/20/2020      Reactions   Ivp Dye [iodinated Diagnostic Agents] Itching   Metrizamide Itching      Medication List    TAKE these medications   acetaminophen 500 MG tablet Commonly known as: TYLENOL Take 2 tablets (1,000 mg total) by mouth every 8 (eight) hours. What changed:   when to take this  reasons to take this   albuterol 108 (90 Base) MCG/ACT inhaler Commonly known as: VENTOLIN HFA Inhale 2 puffs into the lungs every 4 (four) hours as needed for wheezing or shortness of breath.   allopurinol 300 MG tablet Commonly known as: ZYLOPRIM Take 300 mg by mouth daily.   amLODipine 5 MG tablet Commonly known as: NORVASC Take 10 mg by mouth daily.   apixaban 5 MG Tabs tablet Commonly known as: ELIQUIS Take 1 tablet (5 mg total) by mouth 2 (two) times daily.   atorvastatin 10 MG tablet Commonly known as:  LIPITOR Take 10 mg by mouth daily at 6 PM.   calcium-vitamin D 500-200 MG-UNIT tablet Commonly known as: OSCAL WITH D Take 2 tablets by mouth daily with breakfast.   ciprofloxacin 500 MG tablet Commonly known as: CIPRO Take 1 tablet (500 mg total) by mouth 2 (two) times daily for 5 days.   Creon 24000-76000 units Cpep Generic drug: Pancrelipase (Lip-Prot-Amyl) Take 1 capsule by mouth 3 (three) times daily.   esomeprazole 40 MG capsule Commonly known as: NEXIUM Take 40 mg by mouth 2 (two) times daily before a meal.    folic acid 1 MG tablet Commonly known as: FOLVITE Take 1 mg by mouth daily.   linaclotide 145 MCG Caps capsule Commonly known as: Linzess Take 1 capsule (145 mcg total) by mouth daily before breakfast. What changed:   when to take this  reasons to take this   metoprolol succinate 25 MG 24 hr tablet Commonly known as: TOPROL-XL Take 1 tablet (25 mg total) by mouth daily.   metroNIDAZOLE 500 MG tablet Commonly known as: FLAGYL Take 1 tablet (500 mg total) by mouth every 8 (eight) hours for 5 days.   nystatin-triamcinolone ointment Commonly known as: MYCOLOG Apply 1 application topically 2 (two) times daily as needed for rash.   Oxycodone HCl 20 MG Tabs Take 0.5 tablets (10 mg total) by mouth every 6 (six) hours as needed (severe pain). What changed:   how much to take  when to take this   polyethylene glycol 17 g packet Commonly known as: MIRALAX / GLYCOLAX Take 17 g by mouth daily. What changed:   when to take this  reasons to take this   predniSONE 5 MG tablet Commonly known as: DELTASONE Take 1 tablet (5 mg total) by mouth daily with breakfast.   senna-docusate 8.6-50 MG tablet Commonly known as: Senokot-S Take 1 tablet by mouth at bedtime.   valproic acid 250 MG capsule Commonly known as: DEPAKENE Take 250 mg by mouth 3 (three) times daily.            Durable Medical Equipment  (From admission, onward)         Start     Ordered   08/20/20 0756  For home use only DME Bedside commode  Once       Question:  Patient needs a bedside commode to treat with the following condition  Answer:  Balance problem   08/20/20 0756          Follow-up Information    [name], MD Follow up in 1 week(s).   Specialty: Internal Medicine Contact information: [address] [place] [address] [phone]              Allergies  Allergen Reactions  . Ivp Dye [Iodinated Diagnostic Agents] Itching  . Metrizamide Itching    Consultations:  None  Procedures/Studies: CT Abdomen Pelvis Wo Contrast  Result Date: 08/15/2020 CLINICAL DATA:  Abdominal pain, fever and generalized body aches. Diarrhea. EXAM: CT ABDOMEN AND PELVIS WITHOUT CONTRAST TECHNIQUE: Multidetector CT imaging of the abdomen and pelvis was performed following the standard protocol without IV contrast. COMPARISON:  03/24/2020 FINDINGS: Lower chest: The lung bases are clear of an acute process. Chronic lung changes are noted. The heart is normal in size. Prominent pericardial fat. The distal esophagus is grossly normal. Hepatobiliary: No hepatic lesions are identified without contrast. The gallbladder is surgically absent. No biliary dilatation. Pancreas: Stable advanced pancreatic atrophy and diffuse calcifications consistent with chronic calcific  pancreatitis. No acute inflammatory changes. Spleen: Normal size.  No focal lesions. Adrenals/Urinary Tract: The adrenal glands and kidneys are unremarkable. No renal lesions, hydronephrosis or obstructing ureteral calculi. The bladder is unremarkable. Stomach/Bowel: The stomach, duodenum and small bowel are unremarkable. No acute inflammatory changes, mass lesions or obstructive findings. Suspect inflammation of the ascending, transverse and descending colon suggesting an inflammatory or infectious colitis. Scattered colonic diverticuli but no evidence of acute diverticulitis. The sigmoid colon and rectum are grossly normal. Vascular/Lymphatic: Advanced vascular calcifications but no aneurysm. No mesenteric or retroperitoneal mass or adenopathy. Reproductive: The uterus and ovaries are stable. Extensive calcifications are noted. Other: No pelvic mass or adenopathy. No free pelvic fluid collections. No inguinal mass or adenopathy. No abdominal wall hernia or subcutaneous lesions. Musculoskeletal: Advanced fatty atrophy of the hip and pelvic musculature. No acute bony findings. Osteoporosis and remote fractures involving T12, L1 and L3.  IMPRESSION: 1. Suspect an inflammatory or infectious colitis. 2. No other significant abdominal/pelvic findings, mass lesions or adenopathy. 3. Stable advanced pancreatic atrophy and diffuse calcifications consistent with chronic calcific pancreatitis. 4. Status post cholecystectomy. No biliary dilatation. 5. Aortic atherosclerosis. Aortic Atherosclerosis (ICD10-I70.0). Electronically Signed   By: Rudie Meyer M.D.   On: 08/15/2020 16:05   DG Chest Port 1 View  Result Date: 08/15/2020 CLINICAL DATA:  Sepsis. EXAM: PORTABLE CHEST 1 VIEW COMPARISON:  12/24/2019 FINDINGS: The cardiac silhouette, mediastinal and hilar contours are within normal limits and stable given the AP projection and portable technique. The lungs are clear of an acute process. No pleural effusions or pneumothorax. No pulmonary lesions. Stable thoracic fusion hardware and remote healed bilateral rib fractures. Stable advanced degenerative changes involving both shoulder joints. IMPRESSION: No acute cardiopulmonary findings. Electronically Signed   By: Rudie Meyer M.D.   On: 08/15/2020 14:43      Discharge Exam: Vitals:   08/20/20 0258 08/20/20 0504  BP: 101/70 (!) 111/92  Pulse: (!) 107 (!) 109  Resp: 18 18  Temp: 98.7 F (37.1 C) 99 F (37.2 C)  SpO2: 100% 100%   Vitals:   08/19/20 1621 08/19/20 2040 08/20/20 0258 08/20/20 0504  BP: 100/75 110/80 101/70 (!) 111/92  Pulse: 100 (!) 107 (!) 107 (!) 109  Resp: Temp: 97.9 F (36.6 C) 98.8 F (37.1 C) 98.7 F (37.1 C) 99 F (37.2 C)  TempSrc: Oral Oral Oral Oral  SpO2: 95% 97% 100% 100%  Weight:  51 kg    Height:        General: Pt is alert, awake, not in acute distress Cardiovascular: RRR, S1/S2 +, no rubs, no gallops Respiratory: CTA bilaterally, no wheezing, no rhonchi Abdominal: Soft, NT, ND, bowel sounds + Extremities: no edema, no cyanosis    The results of significant diagnostics from this hospitalization (including imaging,  microbiology, ancillary and laboratory) are listed below for reference.     Microbiology: Recent Results (from the past 240 hour(s))  Urine culture     Status: None   Collection Time: 08/15/20  2:17 PM   Specimen: Urine, Random  Result Value Ref Range Status   Specimen Description URINE, RANDOM  Final   Special Requests NONE  Final   Culture   Final    NO GROWTH Performed at [facility], [address], [place], [place] [address]    Report Status 08/16/2020 FINAL  Final  Blood culture (routine single)     Status: None   Collection Time: 08/15/20  2:19 PM  Specimen: BLOOD LEFT HAND  Result Value Ref Range Status   Specimen Description BLOOD LEFT HAND  Final   Special Requests   Final    BOTTLES DRAWN AEROBIC AND ANAEROBIC Blood Culture results may not be optimal due to an inadequate volume of blood received in culture bottles   Culture   Final    NO GROWTH 5 DAYS Performed at [facility], [address], [place], [place] [address]    Report Status 08/20/2020 FINAL  Final  Respiratory Panel by RT PCR (Flu A&B, Covid) - Urine, Catheterized     Status: None   Collection Time: 08/15/20  2:19 PM   Specimen: Urine, Catheterized; Nasopharyngeal  Result Value Ref Range Status   SARS Coronavirus 2 by RT PCR NEGATIVE NEGATIVE Final    Comment: (NOTE) SARS-CoV-2 target nucleic acids are NOT DETECTED.  The SARS-CoV-2 RNA is generally detectable in upper respiratoy specimens during the acute phase of infection. The lowest concentration of SARS-CoV-2 viral copies this assay can detect is 131 copies/mL. A negative result does not preclude SARS-Cov-2 infection and should not be used as the sole basis for treatment or other patient management decisions. A negative result may occur with  improper specimen collection/handling, submission of specimen other than nasopharyngeal swab, presence of viral mutation(s) within the areas targeted by this assay, and inadequate number of  viral copies (<131 copies/mL). A negative result must be combined with clinical observations, patient history, and epidemiological information. The expected result is Negative.  Fact Sheet for Patients:  https://www.moore.com/  Fact Sheet for Healthcare Providers:  https://www.young.biz/  This test is no t yet approved or cleared by the [place] FDA and  has been authorized for detection and/or diagnosis of SARS-CoV-2 by FDA under an Emergency Use Authorization (EUA). This EUA will remain  in effect (meaning this test can be used) for the duration of the COVID-19 declaration under Section 564(b)(1) of the Act, 21 U.S.C. section 360bbb-3(b)(1), unless the authorization is terminated or revoked sooner.     Influenza A by PCR NEGATIVE NEGATIVE Final   Influenza B by PCR NEGATIVE NEGATIVE Final    Comment: (NOTE) The Xpert Xpress SARS-CoV-2/FLU/RSV assay is intended as an aid in  the diagnosis of influenza from Nasopharyngeal swab specimens and  should not be used as a sole basis for treatment. Nasal washings and  aspirates are unacceptable for Xpert Xpress SARS-CoV-2/FLU/RSV  testing.  Fact Sheet for Patients: https://www.moore.com/  Fact Sheet for Healthcare Providers: https://www.young.biz/  This test is not yet approved or cleared by the [place] FDA and  has been authorized for detection and/or diagnosis of SARS-CoV-2 by  FDA under an Emergency Use Authorization (EUA). This EUA will remain  in effect (meaning this test can be used) for the duration of the  Covid-19 declaration under Section 564(b)(1) of the Act, 21  U.S.C. section 360bbb-3(b)(1), unless the authorization is  terminated or revoked. Performed at [facility], [address] [place], [place] [address]   Culture, blood (routine x 2)     Status: None   Collection Time: 08/15/20  2:23 PM   Specimen: BLOOD  Result  Value Ref Range Status   Specimen Description BLOOD LEFT ANTECUBITAL  Final   Special Requests   Final    BOTTLES DRAWN AEROBIC AND ANAEROBIC Blood Culture results may not be optimal due to an inadequate volume of blood received in culture bottles   Culture   Final    NO  GROWTH 5 DAYS Performed at [facility], [address], [place], [place] [address]    Report Status 08/20/2020 FINAL  Final  C Difficile Quick Screen w PCR reflex     Status: None   Collection Time: 08/18/20  8:27 AM   Specimen: STOOL  Result Value Ref Range Status   C Diff antigen NEGATIVE NEGATIVE Final   C Diff toxin NEGATIVE NEGATIVE Final   C Diff interpretation No C. difficile detected.  Final    Comment: Performed at [facility], [address], [place], [place] [address]     Labs: BNP (last 3 results) No results for input(s): BNP in the last 8760 hours. Basic Metabolic Panel: Recent Labs  Lab 08/15/20 1252 08/16/20 0754 08/17/20 0830 08/18/20 0156 08/19/20 1236  NA 139 141 138 140 138  K 3.3* 2.9* 3.2* 4.1 4.5  CL 112* 117* 114* 113* 112*  CO2 15* 16* 15* 15* 19*  GLUCOSE 100* 65* 247* 84 115*  BUN 10 9 6* 6* <5*  CREATININE 1.10* 0.91 0.92 0.99 1.19*  CALCIUM 8.0* 7.7* 7.6* 7.6* 7.6*  MG  --   --  1.3* 2.3  --    Liver Function Tests: Recent Labs  Lab 08/15/20 1252 08/16/20 0754 08/17/20 0830 08/18/20 0156  AST 23 21 27 27   ALT 11 11 10 12   ALKPHOS 99 82 85 95  BILITOT 0.6 0.8 0.6 0.5  PROT 5.7* 4.7* 4.6* 4.7*  ALBUMIN 2.1* 1.7* 1.6* 1.6*   Recent Labs  Lab 08/15/20 1252  LIPASE 22   No results for input(s): AMMONIA in the last 168 hours. CBC: Recent Labs  Lab 08/16/20 0754 08/17/20 0830 08/18/20 0156 08/19/20 0450 08/19/20 1236  WBC 10.5 7.4 9.7 14.1* 7.0  NEUTROABS  --  4.5 8.8* 9.8* 5.0  HGB 8.4* 9.1* 8.5* 7.4* 8.0*  HCT 26.5* 27.6* 25.0* 22.0* 24.1*  MCV 83.3 79.3* 76.2* 77.2* 79.0*  PLT 73* 66* 50* 56* 55*   Cardiac Enzymes: No results for  input(s): CKTOTAL, CKMB, CKMBINDEX, TROPONINI in the last 168 hours. BNP: Invalid input(s): POCBNP CBG: Recent Labs  Lab 08/19/20 0645 08/19/20 1128 08/19/20 1617 08/19/20 2043 08/20/20 0641  GLUCAP 91 99 196* 170* 137*   D-Dimer No results for input(s): DDIMER in the last 72 hours. Hgb A1c Recent Labs    08/18/20 0156  HGBA1C 4.5*   Lipid Profile No results for input(s): CHOL, HDL, LDLCALC, TRIG, CHOLHDL, LDLDIRECT in the last 72 hours. Thyroid function studies No results for input(s): TSH, T4TOTAL, T3FREE, THYROIDAB in the last 72 hours.  Invalid input(s): FREET3 Anemia work up No results for input(s): VITAMINB12, FOLATE, FERRITIN, TIBC, IRON, RETICCTPCT in the last 72 hours. Urinalysis    Component Value Date/Time   COLORURINE YELLOW 08/15/2020 1417   APPEARANCEUR HAZY (A) 08/15/2020 1417   LABSPEC 1.015 08/15/2020 1417   PHURINE 6.0 08/15/2020 1417   GLUCOSEU NEGATIVE 08/15/2020 1417   HGBUR NEGATIVE 08/15/2020 1417   BILIRUBINUR NEGATIVE 08/15/2020 1417   KETONESUR NEGATIVE 08/15/2020 1417   PROTEINUR NEGATIVE 08/15/2020 1417   UROBILINOGEN 1.0 07/27/2015 1052   NITRITE NEGATIVE 08/15/2020 1417   LEUKOCYTESUR LARGE (A) 08/15/2020 1417   Sepsis Labs Invalid input(s): PROCALCITONIN,  WBC,  LACTICIDVEN Microbiology Recent Results (from the past 240 hour(s))  Urine culture     Status: None   Collection Time: 08/15/20  2:17 PM   Specimen: Urine, Random  Result Value Ref Range Status   Specimen Description URINE,  RANDOM  Final   Special Requests NONE  Final   Culture   Final    NO GROWTH Performed at [facility], [address] [place], [place] [address]    Report Status 08/16/2020 FINAL  Final  Blood culture (routine single)     Status: None   Collection Time: 08/15/20  2:19 PM   Specimen: BLOOD LEFT HAND  Result Value Ref Range Status   Specimen Description BLOOD LEFT HAND  Final   Special Requests   Final    BOTTLES DRAWN AEROBIC AND  ANAEROBIC Blood Culture results may not be optimal due to an inadequate volume of blood received in culture bottles   Culture   Final    NO GROWTH 5 DAYS Performed at [facility], [address], [place], [place] [address]    Report Status 08/20/2020 FINAL  Final  Respiratory Panel by RT PCR (Flu A&B, Covid) - Urine, Catheterized     Status: None   Collection Time: 08/15/20  2:19 PM   Specimen: Urine, Catheterized; Nasopharyngeal  Result Value Ref Range Status   SARS Coronavirus 2 by RT PCR NEGATIVE NEGATIVE Final    Comment: (NOTE) SARS-CoV-2 target nucleic acids are NOT DETECTED.  The SARS-CoV-2 RNA is generally detectable in upper respiratoy specimens during the acute phase of infection. The lowest concentration of SARS-CoV-2 viral copies this assay can detect is 131 copies/mL. A negative result does not preclude SARS-Cov-2 infection and should not be used as the sole basis for treatment or other patient management decisions. A negative result may occur with  improper specimen collection/handling, submission of specimen other than nasopharyngeal swab, presence of viral mutation(s) within the areas targeted by this assay, and inadequate number of viral copies (<131 copies/mL). A negative result must be combined with clinical observations, patient history, and epidemiological information. The expected result is Negative.  Fact Sheet for Patients:  https://www.moore.com/  Fact Sheet for Healthcare Providers:  https://www.young.biz/  This test is no t yet approved or cleared by the [place] FDA and  has been authorized for detection and/or diagnosis of SARS-CoV-2 by FDA under an Emergency Use Authorization (EUA). This EUA will remain  in effect (meaning this test can be used) for the duration of the COVID-19 declaration under Section 564(b)(1) of the Act, 21 U.S.C. section 360bbb-3(b)(1), unless the authorization is terminated  or revoked sooner.     Influenza A by PCR NEGATIVE NEGATIVE Final   Influenza B by PCR NEGATIVE NEGATIVE Final    Comment: (NOTE) The Xpert Xpress SARS-CoV-2/FLU/RSV assay is intended as an aid in  the diagnosis of influenza from Nasopharyngeal swab specimens and  should not be used as a sole basis for treatment. Nasal washings and  aspirates are unacceptable for Xpert Xpress SARS-CoV-2/FLU/RSV  testing.  Fact Sheet for Patients: https://www.moore.com/  Fact Sheet for Healthcare Providers: https://www.young.biz/  This test is not yet approved or cleared by the [place] FDA and  has been authorized for detection and/or diagnosis of SARS-CoV-2 by  FDA under an Emergency Use Authorization (EUA). This EUA will remain  in effect (meaning this test can be used) for the duration of the  Covid-19 declaration under Section 564(b)(1) of the Act, 21  U.S.C. section 360bbb-3(b)(1), unless the authorization is  terminated or revoked. Performed at [facility], [address], [place], [place] [address]   Culture, blood (routine x 2)     Status: None   Collection Time: 08/15/20  2:23 PM   Specimen: BLOOD  Result Value Ref Range Status   Specimen Description BLOOD LEFT ANTECUBITAL  Final   Special Requests   Final    BOTTLES DRAWN AEROBIC AND ANAEROBIC Blood Culture results may not be optimal due to an inadequate volume of blood received in culture bottles   Culture   Final    NO GROWTH 5 DAYS Performed at [facility], [address] [place], [place] [address]    Report Status 08/20/2020 FINAL  Final  C Difficile Quick Screen w PCR reflex     Status: None   Collection Time: 08/18/20  8:27 AM   Specimen: STOOL  Result Value Ref Range Status   C Diff antigen NEGATIVE NEGATIVE Final   C Diff toxin NEGATIVE NEGATIVE Final   C Diff interpretation No C. difficile detected.  Final    Comment: Performed at [facility], [address], [place], [place] [address]     Time coordinating discharge: Over 30 minutes  SIGNED:   Hughie Closs, MD  [facility] 08/20/2020, 7:57 AM  If 7PM-7AM, please contact night-coverage [email or web address]

## 2020-08-26 ENCOUNTER — Inpatient Hospital Stay (HOSPITAL_COMMUNITY)
Admission: EM | Admit: 2020-08-26 | Discharge: 2020-09-10 | DRG: 682 | Disposition: A | Discharge status: Home Health | Payer: Medicaid Other | Attending: Internal Medicine | Admitting: Internal Medicine

## 2020-08-26 ENCOUNTER — Emergency Department (HOSPITAL_COMMUNITY): Payer: Medicaid Other

## 2020-08-26 ENCOUNTER — Encounter (HOSPITAL_COMMUNITY): Payer: Self-pay

## 2020-08-26 DIAGNOSIS — E875 Hyperkalemia: Secondary | ICD-10-CM | POA: Diagnosis not present

## 2020-08-26 DIAGNOSIS — R1310 Dysphagia, unspecified: Secondary | ICD-10-CM | POA: Diagnosis present

## 2020-08-26 DIAGNOSIS — Z79899 Other long term (current) drug therapy: Secondary | ICD-10-CM

## 2020-08-26 DIAGNOSIS — M069 Rheumatoid arthritis, unspecified: Secondary | ICD-10-CM | POA: Diagnosis present

## 2020-08-26 DIAGNOSIS — K567 Ileus, unspecified: Secondary | ICD-10-CM | POA: Diagnosis not present

## 2020-08-26 DIAGNOSIS — I10 Essential (primary) hypertension: Secondary | ICD-10-CM | POA: Diagnosis present

## 2020-08-26 DIAGNOSIS — K861 Other chronic pancreatitis: Secondary | ICD-10-CM | POA: Diagnosis present

## 2020-08-26 DIAGNOSIS — Z841 Family history of disorders of kidney and ureter: Secondary | ICD-10-CM

## 2020-08-26 DIAGNOSIS — G9341 Metabolic encephalopathy: Secondary | ICD-10-CM | POA: Diagnosis not present

## 2020-08-26 DIAGNOSIS — J9610 Chronic respiratory failure, unspecified whether with hypoxia or hypercapnia: Secondary | ICD-10-CM

## 2020-08-26 DIAGNOSIS — D6959 Other secondary thrombocytopenia: Secondary | ICD-10-CM | POA: Diagnosis present

## 2020-08-26 DIAGNOSIS — Z8673 Personal history of transient ischemic attack (TIA), and cerebral infarction without residual deficits: Secondary | ICD-10-CM

## 2020-08-26 DIAGNOSIS — R6511 Systemic inflammatory response syndrome (SIRS) of non-infectious origin with acute organ dysfunction: Secondary | ICD-10-CM | POA: Diagnosis present

## 2020-08-26 DIAGNOSIS — Z4659 Encounter for fitting and adjustment of other gastrointestinal appliance and device: Secondary | ICD-10-CM

## 2020-08-26 DIAGNOSIS — R579 Shock, unspecified: Secondary | ICD-10-CM | POA: Diagnosis not present

## 2020-08-26 DIAGNOSIS — A419 Sepsis, unspecified organism: Secondary | ICD-10-CM | POA: Diagnosis not present

## 2020-08-26 DIAGNOSIS — E870 Hyperosmolality and hypernatremia: Secondary | ICD-10-CM | POA: Diagnosis not present

## 2020-08-26 DIAGNOSIS — Z91041 Radiographic dye allergy status: Secondary | ICD-10-CM | POA: Diagnosis not present

## 2020-08-26 DIAGNOSIS — E872 Acidosis: Secondary | ICD-10-CM | POA: Diagnosis present

## 2020-08-26 DIAGNOSIS — R7989 Other specified abnormal findings of blood chemistry: Secondary | ICD-10-CM | POA: Diagnosis not present

## 2020-08-26 DIAGNOSIS — K7682 Hepatic encephalopathy: Secondary | ICD-10-CM

## 2020-08-26 DIAGNOSIS — G9340 Encephalopathy, unspecified: Secondary | ICD-10-CM

## 2020-08-26 DIAGNOSIS — D684 Acquired coagulation factor deficiency: Secondary | ICD-10-CM | POA: Diagnosis present

## 2020-08-26 DIAGNOSIS — R578 Other shock: Secondary | ICD-10-CM | POA: Diagnosis not present

## 2020-08-26 DIAGNOSIS — R918 Other nonspecific abnormal finding of lung field: Secondary | ICD-10-CM

## 2020-08-26 DIAGNOSIS — D649 Anemia, unspecified: Secondary | ICD-10-CM | POA: Diagnosis present

## 2020-08-26 DIAGNOSIS — J95850 Mechanical complication of respirator: Secondary | ICD-10-CM

## 2020-08-26 DIAGNOSIS — M109 Gout, unspecified: Secondary | ICD-10-CM | POA: Diagnosis present

## 2020-08-26 DIAGNOSIS — J9601 Acute respiratory failure with hypoxia: Secondary | ICD-10-CM | POA: Diagnosis not present

## 2020-08-26 DIAGNOSIS — N19 Unspecified kidney failure: Secondary | ICD-10-CM

## 2020-08-26 DIAGNOSIS — R6521 Severe sepsis with septic shock: Secondary | ICD-10-CM | POA: Diagnosis not present

## 2020-08-26 DIAGNOSIS — K7200 Acute and subacute hepatic failure without coma: Secondary | ICD-10-CM

## 2020-08-26 DIAGNOSIS — Z8249 Family history of ischemic heart disease and other diseases of the circulatory system: Secondary | ICD-10-CM

## 2020-08-26 DIAGNOSIS — E2740 Unspecified adrenocortical insufficiency: Secondary | ICD-10-CM | POA: Diagnosis present

## 2020-08-26 DIAGNOSIS — N179 Acute kidney failure, unspecified: Secondary | ICD-10-CM | POA: Diagnosis not present

## 2020-08-26 DIAGNOSIS — L89122 Pressure ulcer of left upper back, stage 2: Secondary | ICD-10-CM | POA: Diagnosis not present

## 2020-08-26 DIAGNOSIS — L8990 Pressure ulcer of unspecified site, unspecified stage: Secondary | ICD-10-CM | POA: Diagnosis present

## 2020-08-26 DIAGNOSIS — I480 Paroxysmal atrial fibrillation: Secondary | ICD-10-CM | POA: Diagnosis present

## 2020-08-26 DIAGNOSIS — E876 Hypokalemia: Secondary | ICD-10-CM | POA: Diagnosis not present

## 2020-08-26 DIAGNOSIS — R4182 Altered mental status, unspecified: Secondary | ICD-10-CM | POA: Diagnosis not present

## 2020-08-26 DIAGNOSIS — Z978 Presence of other specified devices: Secondary | ICD-10-CM

## 2020-08-26 DIAGNOSIS — E861 Hypovolemia: Secondary | ICD-10-CM | POA: Diagnosis present

## 2020-08-26 DIAGNOSIS — K529 Noninfective gastroenteritis and colitis, unspecified: Secondary | ICD-10-CM | POA: Diagnosis present

## 2020-08-26 DIAGNOSIS — R6520 Severe sepsis without septic shock: Secondary | ICD-10-CM | POA: Diagnosis not present

## 2020-08-26 DIAGNOSIS — Z8711 Personal history of peptic ulcer disease: Secondary | ICD-10-CM

## 2020-08-26 DIAGNOSIS — E785 Hyperlipidemia, unspecified: Secondary | ICD-10-CM | POA: Diagnosis present

## 2020-08-26 DIAGNOSIS — Z87891 Personal history of nicotine dependence: Secondary | ICD-10-CM

## 2020-08-26 DIAGNOSIS — E860 Dehydration: Secondary | ICD-10-CM | POA: Diagnosis present

## 2020-08-26 DIAGNOSIS — G40211 Localization-related (focal) (partial) symptomatic epilepsy and epileptic syndromes with complex partial seizures, intractable, with status epilepticus: Secondary | ICD-10-CM | POA: Diagnosis not present

## 2020-08-26 DIAGNOSIS — Z20822 Contact with and (suspected) exposure to covid-19: Secondary | ICD-10-CM | POA: Diagnosis present

## 2020-08-26 DIAGNOSIS — Z7952 Long term (current) use of systemic steroids: Secondary | ICD-10-CM

## 2020-08-26 DIAGNOSIS — J9611 Chronic respiratory failure with hypoxia: Secondary | ICD-10-CM | POA: Diagnosis not present

## 2020-08-26 DIAGNOSIS — E1165 Type 2 diabetes mellitus with hyperglycemia: Secondary | ICD-10-CM | POA: Diagnosis not present

## 2020-08-26 DIAGNOSIS — K56609 Unspecified intestinal obstruction, unspecified as to partial versus complete obstruction: Secondary | ICD-10-CM

## 2020-08-26 DIAGNOSIS — R609 Edema, unspecified: Secondary | ICD-10-CM | POA: Diagnosis not present

## 2020-08-26 DIAGNOSIS — L538 Other specified erythematous conditions: Secondary | ICD-10-CM | POA: Diagnosis not present

## 2020-08-26 DIAGNOSIS — K7469 Other cirrhosis of liver: Secondary | ICD-10-CM | POA: Diagnosis present

## 2020-08-26 DIAGNOSIS — R569 Unspecified convulsions: Secondary | ICD-10-CM | POA: Diagnosis not present

## 2020-08-26 DIAGNOSIS — I2510 Atherosclerotic heart disease of native coronary artery without angina pectoris: Secondary | ICD-10-CM | POA: Diagnosis present

## 2020-08-26 LAB — CBC WITH DIFFERENTIAL/PLATELET
Abs Immature Granulocytes: 0.19 10*3/uL — ABNORMAL HIGH (ref 0.00–0.07)
Basophils Absolute: 0.1 10*3/uL (ref 0.0–0.1)
Basophils Relative: 0 %
Eosinophils Absolute: 0.1 10*3/uL (ref 0.0–0.5)
Eosinophils Relative: 0 %
HCT: 23.7 % — ABNORMAL LOW (ref 36.0–46.0)
Hemoglobin: 8.1 g/dL — ABNORMAL LOW (ref 12.0–15.0)
Immature Granulocytes: 2 %
Lymphocytes Relative: 22 %
Lymphs Abs: 2.6 10*3/uL (ref 0.7–4.0)
MCH: 26.6 pg (ref 26.0–34.0)
MCHC: 34.2 g/dL (ref 30.0–36.0)
MCV: 78 fL — ABNORMAL LOW (ref 80.0–100.0)
Monocytes Absolute: 1.5 10*3/uL — ABNORMAL HIGH (ref 0.1–1.0)
Monocytes Relative: 12 %
Neutro Abs: 7.6 10*3/uL (ref 1.7–7.7)
Neutrophils Relative %: 64 %
Platelets: 136 10*3/uL — ABNORMAL LOW (ref 150–400)
RBC: 3.04 MIL/uL — ABNORMAL LOW (ref 3.87–5.11)
RDW: 18.8 % — ABNORMAL HIGH (ref 11.5–15.5)
WBC: 12 10*3/uL — ABNORMAL HIGH (ref 4.0–10.5)
nRBC: 0.2 % (ref 0.0–0.2)

## 2020-08-26 LAB — URINALYSIS, ROUTINE W REFLEX MICROSCOPIC
Bilirubin Urine: NEGATIVE
Glucose, UA: NEGATIVE mg/dL
Ketones, ur: NEGATIVE mg/dL
Nitrite: NEGATIVE
Protein, ur: 30 mg/dL — AB
Specific Gravity, Urine: 1.016 (ref 1.005–1.030)
WBC, UA: >50 WBC/hpf — ABNORMAL HIGH (ref 0–5)
pH: 5 (ref 5.0–8.0)

## 2020-08-26 LAB — C DIFFICILE QUICK SCREEN W PCR REFLEX
C Diff antigen: NEGATIVE
C Diff interpretation: NOT DETECTED
C Diff toxin: NEGATIVE

## 2020-08-26 LAB — COMPREHENSIVE METABOLIC PANEL
ALT: 13 U/L (ref 0–44)
AST: 48 U/L — ABNORMAL HIGH (ref 15–41)
Albumin: 2 g/dL — ABNORMAL LOW (ref 3.5–5.0)
Alkaline Phosphatase: 167 U/L — ABNORMAL HIGH (ref 38–126)
Anion gap: 18 — ABNORMAL HIGH (ref 5–15)
BUN: 11 mg/dL (ref 8–23)
CO2: 18 mmol/L — ABNORMAL LOW (ref 22–32)
Calcium: 7.6 mg/dL — ABNORMAL LOW (ref 8.9–10.3)
Chloride: 106 mmol/L (ref 98–111)
Creatinine, Ser: 2.43 mg/dL — ABNORMAL HIGH (ref 0.44–1.00)
GFR calc non Af Amer: 21 mL/min — ABNORMAL LOW (ref >60)
Glucose, Bld: 94 mg/dL (ref 70–99)
Potassium: 4 mmol/L (ref 3.5–5.1)
Sodium: 142 mmol/L (ref 135–145)
Total Bilirubin: 2.5 mg/dL — ABNORMAL HIGH (ref 0.3–1.2)
Total Protein: 5.8 g/dL — ABNORMAL LOW (ref 6.5–8.1)

## 2020-08-26 LAB — RESP PANEL BY RT PCR (RSV, FLU A&B, COVID)
Influenza A by PCR: NEGATIVE
Influenza B by PCR: NEGATIVE
Respiratory Syncytial Virus by PCR: NEGATIVE
SARS Coronavirus 2 by RT PCR: NEGATIVE

## 2020-08-26 LAB — D-DIMER, QUANTITATIVE: D-Dimer, Quant: 2.66 ug/mL-FEU — ABNORMAL HIGH (ref 0.00–0.50)

## 2020-08-26 LAB — PROTIME-INR
INR: 4.1 (ref 0.8–1.2)
Prothrombin Time: 38.6 seconds — ABNORMAL HIGH (ref 11.4–15.2)

## 2020-08-26 LAB — BLOOD GAS, ARTERIAL
Acid-base deficit: 9 mmol/L — ABNORMAL HIGH (ref 0.0–2.0)
Bicarbonate: 17.1 mmol/L — ABNORMAL LOW (ref 20.0–28.0)
O2 Saturation: 72.7 %
Patient temperature: 98.6
pCO2 arterial: 40.8 mmHg (ref 32.0–48.0)
pH, Arterial: 7.246 — ABNORMAL LOW (ref 7.350–7.450)
pO2, Arterial: 51.5 mmHg — ABNORMAL LOW (ref 83.0–108.0)

## 2020-08-26 LAB — LACTIC ACID, PLASMA
Lactic Acid, Venous: 6.7 mmol/L (ref 0.5–1.9)
Lactic Acid, Venous: 6.8 mmol/L (ref 0.5–1.9)

## 2020-08-26 LAB — APTT: aPTT: 54 seconds — ABNORMAL HIGH (ref 24–36)

## 2020-08-26 LAB — OCCULT BLOOD X 1 CARD TO LAB, STOOL: Fecal Occult Bld: NEGATIVE

## 2020-08-26 LAB — LIPASE, BLOOD: Lipase: 22 U/L (ref 11–51)

## 2020-08-26 LAB — TROPONIN I (HIGH SENSITIVITY): Troponin I (High Sensitivity): 13 ng/L (ref <18)

## 2020-08-26 MED ORDER — PIPERACILLIN-TAZOBACTAM IN DEX 2-0.25 GM/50ML IV SOLN
2.2500 g | Freq: Four times a day (QID) | INTRAVENOUS | Status: AC
  Administered 2020-08-27 – 2020-09-01 (×24): 2.25 g via INTRAVENOUS
  Filled 2020-08-26 (×27): qty 50

## 2020-08-26 MED ORDER — HYDROCORTISONE NA SUCCINATE PF 100 MG IJ SOLR
100.0000 mg | Freq: Once | INTRAMUSCULAR | Status: AC
  Administered 2020-08-26: 100 mg via INTRAVENOUS
  Filled 2020-08-26: qty 2

## 2020-08-26 MED ORDER — VITAMIN K1 10 MG/ML IJ SOLN
10.0000 mg | Freq: Once | INTRAVENOUS | Status: AC
  Administered 2020-08-27: 10 mg via INTRAVENOUS
  Filled 2020-08-26 (×2): qty 1

## 2020-08-26 MED ORDER — NOREPINEPHRINE 4 MG/250ML-% IV SOLN
0.0000 ug/min | INTRAVENOUS | Status: DC
  Administered 2020-08-26: 2 ug/min via INTRAVENOUS
  Administered 2020-08-27: 3 ug/min via INTRAVENOUS
  Administered 2020-08-28: 4 ug/min via INTRAVENOUS
  Filled 2020-08-26 (×2): qty 250

## 2020-08-26 MED ORDER — FLUDROCORTISONE ACETATE 0.1 MG PO TABS: 0.1000 mg | ORAL_TABLET | Freq: Every day | ORAL | Status: DC

## 2020-08-26 MED ORDER — VALPROIC ACID 250 MG PO CAPS
250.0000 mg | ORAL_CAPSULE | Freq: Three times a day (TID) | ORAL | Status: DC
  Administered 2020-08-27: 250 mg via ORAL
  Filled 2020-08-26 (×2): qty 1

## 2020-08-26 MED ORDER — PIPERACILLIN-TAZOBACTAM 3.375 G IVPB 30 MIN
3.3750 g | Freq: Once | INTRAVENOUS | Status: AC
  Administered 2020-08-26: 3.375 g via INTRAVENOUS
  Filled 2020-08-26: qty 50

## 2020-08-26 MED ORDER — POLYETHYLENE GLYCOL 3350 17 G PO PACK: 17.0000 g | PACK | Freq: Every day | ORAL | Status: DC | PRN

## 2020-08-26 MED ORDER — SODIUM CHLORIDE 0.9 % IV BOLUS
2000.0000 mL | Freq: Once | INTRAVENOUS | Status: AC
  Administered 2020-08-26: 2000 mL via INTRAVENOUS

## 2020-08-26 MED ORDER — HYDROCORTISONE NA SUCCINATE PF 100 MG IJ SOLR
50.0000 mg | Freq: Four times a day (QID) | INTRAMUSCULAR | Status: DC
  Administered 2020-08-27 – 2020-08-30 (×15): 50 mg via INTRAVENOUS
  Filled 2020-08-26 (×14): qty 2

## 2020-08-26 MED ORDER — SODIUM CHLORIDE 0.9 % IV BOLUS: 1000.0000 mL | Freq: Once | INTRAVENOUS | Status: DC

## 2020-08-26 MED ORDER — DOCUSATE SODIUM 100 MG PO CAPS: 100.0000 mg | ORAL_CAPSULE | Freq: Two times a day (BID) | ORAL | Status: DC | PRN

## 2020-08-26 MED ORDER — ORAL CARE MOUTH RINSE
15.0000 mL | Freq: Two times a day (BID) | OROMUCOSAL | Status: DC
  Administered 2020-08-27 – 2020-08-29 (×5): 15 mL via OROMUCOSAL

## 2020-08-26 MED ORDER — ALBUMIN HUMAN 5 % IV SOLN
25.0000 g | Freq: Once | INTRAVENOUS | Status: AC
  Administered 2020-08-26: 25 g via INTRAVENOUS
  Filled 2020-08-26: qty 500

## 2020-08-26 MED ORDER — THIAMINE HCL 100 MG/ML IJ SOLN
500.0000 mg | INTRAVENOUS | Status: AC
  Administered 2020-08-26 – 2020-08-29 (×3): 500 mg via INTRAVENOUS
  Filled 2020-08-26 (×3): qty 5

## 2020-08-26 MED ORDER — SODIUM CHLORIDE 0.9 % IV BOLUS: 500.0000 mL | Freq: Once | INTRAVENOUS | Status: DC

## 2020-08-26 MED ORDER — LACTATED RINGERS IV BOLUS: 2000.0000 mL | Freq: Once | INTRAVENOUS | Status: DC

## 2020-08-26 MED ORDER — ALBUTEROL SULFATE HFA 108 (90 BASE) MCG/ACT IN AERS
2.0000 | INHALATION_SPRAY | RESPIRATORY_TRACT | Status: DC | PRN
  Filled 2020-08-26: qty 6.7

## 2020-08-26 MED ORDER — CHLORHEXIDINE GLUCONATE CLOTH 2 % EX PADS
6.0000 | MEDICATED_PAD | Freq: Every day | CUTANEOUS | Status: DC
  Administered 2020-08-26 – 2020-09-10 (×16): 6 via TOPICAL

## 2020-08-26 MED ORDER — SODIUM CHLORIDE 0.9 % IV SOLN
2.0000 g | INTRAVENOUS | Status: DC
  Filled 2020-08-26: qty 20

## 2020-08-26 MED ORDER — SODIUM CHLORIDE 0.9 % IV BOLUS
500.0000 mL | Freq: Once | INTRAVENOUS | Status: AC
  Administered 2020-08-26: 500 mL via INTRAVENOUS

## 2020-08-26 MED ORDER — ALBUMIN HUMAN 25 % IV SOLN
25.0000 g | Freq: Once | INTRAVENOUS | Status: DC
  Filled 2020-08-26: qty 100

## 2020-08-26 MED ORDER — LACTATED RINGERS IV SOLN: INTRAVENOUS | Status: DC

## 2020-08-26 MED ORDER — NOREPINEPHRINE 4 MG/250ML-% IV SOLN
0.0000 ug/min | INTRAVENOUS | Status: DC
  Filled 2020-08-26: qty 250

## 2020-08-26 MED ORDER — FLUDROCORTISONE ACETATE 0.1 MG PO TABS
0.1000 mg | ORAL_TABLET | Freq: Every day | ORAL | Status: DC
  Administered 2020-08-26 – 2020-08-30 (×3): 0.1 mg via ORAL
  Filled 2020-08-26 (×5): qty 1

## 2020-08-26 NOTE — ED Notes (Signed)
CRITICAL VALUE STICKER  CRITICAL VALUE: Lactic Acid 6.7; PT 38.6; INR 4.1  RECEIVER (on-site recipient of call): CSimpson, RN   DATE & TIME NOTIFIED: 1850 08/26/20  MESSENGER (representative from lab): MBlack  MD NOTIFIED: [place]  TIME OF NOTIFICATION: 1855  RESPONSE: see orders

## 2020-08-26 NOTE — Consult Note (Signed)
NAME:  Amber [name], MRN:  [id number], DOB:  23-Nov-1955, LOS: 0 ADMISSION DATE:  08/26/2020, CONSULTATION DATE: 08/26/2020 REFERRING MD: Dr. Lockie Mola, CHIEF COMPLAINT: Hypertension  Brief History   64 year old female history of CAD, CVA, type 2 diabetes, small bowel obstruction, hypertension, gastroesophageal reflux, seizure.  Presents to the ER with diarrhea and weakness.  Hypotensive with fluid resuscitation.  Pulmonary critical care consulted for management and concern for sepsis.  History of present illness   64 year old female past medical history of coronary artery disease, CVA, type 2 diabetes, small bowel obstruction, hypertension, gastroesophageal reflux, seizure, asthma on chronic steroids.  Presented to the ER with diarrhea and weakness.  Recent hospitalization for enterocolitis treated with antibiotics.  Initial presentation to the ER revealed hypotension status post fluid resuscitation.  Patient remained hypotensive however mental status was good.  She was awake alert following commands.  Her blood pressure readings in the ER have been somewhat all over the place.  I along with the nursing staff question its accuracy.  In the emergency department patient was found to have a lactic acid of greater than 6, white blood cell count 12,000, hemoglobin 8.1, INR 4.1.  Past Medical History   Past Medical History:  Diagnosis Date  . ACS (acute coronary syndrome) (HCC) 06/12/2015  . Acute on chronic blood loss anemia 11/25/2017  . Arthritis   . Asthma   . 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 CHF (congestive heart failure) (HCC)   . Chronic pain   . Constipation   . Diabetes mellitus without complication (HCC)   . Duodenitis   . Embolic stroke (HCC)   . Falls frequently   . Focal seizure (HCC)   . GERD (gastroesophageal reflux disease)   . Hypertension   . Impacted fracture 02/15/2017   right glenoid  . Pneumonia 10/2015  . Seizures (HCC)    last seizure  March 2015  . Small bowel obstruction (HCC) 11/25/2017     Significant Hospital Events   ICU admission  Consults:  Pulmonary critical care  Procedures:    Significant Diagnostic Tests:  CT head: Chronic atrophic changes, encephalomalacia of the right history with prior MCA.  CT chest, abdomen and pelvis: Small bilateral pleural effusion, few bandlike densities, sigmoid diverticulosis loose stool within the colon no obvious inflammatory changes. The patient's images have been independently reviewed by me.    Micro Data:  C. difficile pending GI panel pending COVID-19 negative  Antimicrobials:  Piperacillin tazobactam  Interim history/subjective:  Per HPI above  Objective   Blood pressure (!) 113/52, pulse (!) 111, temperature 98.5 F (36.9 C), temperature source Rectal, resp. rate (!) 26, SpO2 100 %.       No intake or output data in the 24 hours ending 08/26/20 2214 There were no vitals filed for this visit.  Examination: General: Chronically ill-appearing debilitated female resting in bed. HENT: NCAT tracking Lungs: Clear to auscultation bilaterally, diminished in the bases Cardiovascular: Tachycardic, regular Abdomen: Mildly distended, nontender to palpation, bowel sounds present Extremities: Bilateral lower extremity edema, edema in dependent areas of the upper extremities and forearms etc. Neuro: Alert oriented following commands she is weak GU: Deferred, Foley in place  Resolved Hospital Problem list     Assessment & Plan:   Sepsis, present on admission Likely urinary tract infection, possible intra-abdominal source, possible colitis Lactic acidosis secondary to above Poor nutritional status, poor p.o. intake for several days Diarrhea, volume loss Hypovolemia AKI secondary to above Plan: Continue  antibiotics Continue fluid resuscitation with lactated Ringer's Complete albumin dosing that has been ordered already. Hydrocortisone 100 mg x 1 followed  by 50 mg every 6, patient is on chronic steroids for asthma. Thiamine to help with lactate clearance Map goal greater than 65 mmHg. Can start norepinephrine infusion peripherally if needed. Monitor urine output with Foley Repeat labs in a.m. Trend lactate Follow-up blood and urine cultures, GI panel  Elevated INR PTA meds suggest she is on Eliquis However patient is unsure of medications. Seems a little high to all be caused by eliquis  Will give a dose of vitamin K  Seizure disorder PTA meds on valproic acid Can continue home dosing.  Vascular access Patient currently only has 1 good peripheral and her right arm. EDP plus nurse plus IV team have attempted at additional lines. At this time patient is refusing additional needle sticks.   Best practice:  Diet: Clears Pain/Anxiety/Delirium protocol (if indicated): Not applicable VAP protocol (if indicated): Not applicable DVT prophylaxis: Holding due to elevated INR GI prophylaxis: Not applicable Glucose control: CBGs Mobility: Bedrest Code Status: Full Family Communication: We will update family Disposition: ICU admission  Labs   CBC: Recent Labs  Lab 08/20/20 0856 08/26/20 1750  WBC 13.2* 12.0*  NEUTROABS 10.0* 7.6  HGB 7.4* 8.1*  HCT 21.4* 23.7*  MCV 77.5* 78.0*  PLT 51* 136*    Basic Metabolic Panel: Recent Labs  Lab 08/26/20 1750  NA 142  K 4.0  CL 106  CO2 18*  GLUCOSE 94  BUN 11  CREATININE 2.43*  CALCIUM 7.6*   GFR: Estimated Creatinine Clearance: 17.2 mL/min (A) (by C-G formula based on SCr of 2.43 mg/dL (H)). Recent Labs  Lab 08/20/20 0856 08/26/20 1739 08/26/20 1750 08/26/20 1939  WBC 13.2*  --  12.0*  --   LATICACIDVEN  --  6.7*  --  6.8*    Liver Function Tests: Recent Labs  Lab 08/26/20 1750  AST 48*  ALT 13  ALKPHOS 167*  BILITOT 2.5*  PROT 5.8*  ALBUMIN 2.0*   Recent Labs  Lab 08/26/20 1750  LIPASE 22   No results for input(s): AMMONIA in the last 168  hours.  ABG    Component Value Date/Time   PHART 7.246 (L) 08/26/2020 1924   PCO2ART 40.8 08/26/2020 1924   PO2ART 51.5 (L) 08/26/2020 1924   HCO3 17.1 (L) 08/26/2020 1924   TCO2 25 02/12/2020 1910   ACIDBASEDEF 9.0 (H) 08/26/2020 1924   O2SAT 72.7 08/26/2020 1924     Coagulation Profile: Recent Labs  Lab 08/26/20 1750  INR 4.1*    Cardiac Enzymes: No results for input(s): CKTOTAL, CKMB, CKMBINDEX, TROPONINI in the last 168 hours.  HbA1C: Hgb A1c MFr Bld  Date/Time Value Ref Range Status  08/18/2020 01:56 AM 4.5 (L) 4.8 - 5.6 % Final    Comment:    (NOTE)         Prediabetes: 5.7 - 6.4         Diabetes: >6.4         Glycemic control for adults with diabetes: <7.0   08/16/2020 07:54 AM QNSTST 4.8 - 5.6 % Final    Comment:    (NOTE) Test not performed. Insufficient specimen to perform or complete analysis.         Prediabetes: 5.7 - 6.4         Diabetes: >6.4         Glycemic control for adults with diabetes: <7.0  CBG: Recent Labs  Lab 08/20/20 0641 08/20/20 1129  GLUCAP 137* 114*    Review of Systems:     Past Medical History  She,  has a past medical history of ACS (acute coronary syndrome) (HCC) (06/12/2015), Acute on chronic blood loss anemia (11/25/2017), Arthritis, Asthma, Cerebrovascular accident (CVA) due to embolism (HCC) (10/26/2016), CHF (congestive heart failure) (HCC), Chronic pain, Constipation, Diabetes mellitus without complication (HCC), Duodenitis, Embolic stroke (HCC), Falls frequently, Focal seizure (HCC), GERD (gastroesophageal reflux disease), Hypertension, Impacted fracture (02/15/2017), Pneumonia (10/2015), Seizures (HCC), and Small bowel obstruction (HCC) (11/25/2017).   Surgical History    Past Surgical History:  Procedure Laterality Date  . APPENDECTOMY    . BACK SURGERY    . CHOLECYSTECTOMY    . TEE WITHOUT CARDIOVERSION N/A 10/30/2016   Procedure: TRANSESOPHAGEAL ECHOCARDIOGRAM (TEE);  Surgeon: Lars Masson, MD;   Location: [facility] ENDOSCOPY;  Service: Cardiovascular;  Laterality: N/A;     Social History   reports that she quit smoking about 11 years ago. She has never used smokeless tobacco. She reports previous alcohol use. She reports that she does not use drugs.   Family History   Her family history includes Hypertension in her brother and sister; Kidney failure in her mother.   Allergies Allergies  Allergen Reactions  . Ivp Dye [Iodinated Diagnostic Agents] Itching  . Metrizamide Itching     Home Medications  Prior to Admission medications   Medication Sig Start Date End Date Taking? Authorizing Provider  acetaminophen (TYLENOL) 500 MG tablet Take 2 tablets (1,000 mg total) by mouth every 8 (eight) hours. Patient taking differently: Take 1,000 mg by mouth every 8 (eight) hours as needed for mild pain or moderate pain.  12/30/19  Yes Almon Hercules, MD  albuterol (PROVENTIL HFA;VENTOLIN HFA) 108 (90 Base) MCG/ACT inhaler Inhale 2 puffs into the lungs every 4 (four) hours as needed for wheezing or shortness of breath. 01/16/19  Yes Glade Lloyd, MD  allopurinol (ZYLOPRIM) 300 MG tablet Take 300 mg by mouth daily. 11/11/16  Yes [provider]  amLODipine (NORVASC) 5 MG tablet Take 10 mg by mouth daily.    Yes [provider]  apixaban (ELIQUIS) 5 MG TABS tablet Take 1 tablet (5 mg total) by mouth 2 (two) times daily. 02/14/20  Yes Marlin Canary U, DO  atorvastatin (LIPITOR) 10 MG tablet Take 10 mg by mouth daily at 6 PM.  08/13/15  Yes [provider]  CREON 24000-76000 units CPEP Take 1 capsule by mouth 3 (three) times daily.  09/23/19  Yes [provider]  esomeprazole (NEXIUM) 40 MG capsule Take 40 mg by mouth 2 (two) times daily before a meal.  11/26/19  Yes [provider]  folic acid (FOLVITE) 1 MG tablet Take 1 mg by mouth daily. 11/20/19  Yes [provider]  linaclotide (LINZESS) 145 MCG CAPS capsule Take 1 capsule (145 mcg total) by mouth  daily before breakfast. Patient taking differently: Take 145 mcg by mouth daily as needed.  03/29/20  Yes Cheron Schaumann K, PA-C  metoprolol succinate (TOPROL-XL) 25 MG 24 hr tablet Take 1 tablet (25 mg total) by mouth daily. 10/15/18  Yes Mikhail, [place], DO  nystatin-triamcinolone ointment (MYCOLOG) Apply 1 application topically 2 (two) times daily as needed for rash. 09/23/19  Yes [provider]  Oxycodone HCl 20 MG TABS Take 0.5 tablets (10 mg total) by mouth every 6 (six) hours as needed (severe pain). Patient taking differently: Take 20 mg by mouth  every 6 (six) hours.  03/27/20  Yes Rhetta Mura, MD  polyethylene glycol (MIRALAX / GLYCOLAX) 17 g packet Take 17 g by mouth daily. Patient taking differently: Take 17 g by mouth daily as needed for mild constipation.  03/28/20  Yes Rhetta Mura, MD  predniSONE (DELTASONE) 5 MG tablet Take 1 tablet (5 mg total) by mouth daily with breakfast. 01/16/19  Yes Glade Lloyd, MD  valproic acid (DEPAKENE) 250 MG capsule Take 250 mg by mouth 3 (three) times daily.    Yes [provider]  calcium-vitamin D (OSCAL WITH D) 500-200 MG-UNIT tablet Take 2 tablets by mouth daily with breakfast. Patient not taking: Reported on 08/26/2020 12/30/19   Almon Hercules, MD  senna-docusate (SENOKOT-S) 8.6-50 MG tablet Take 1 tablet by mouth at bedtime. Patient not taking: Reported on 08/15/2020 03/27/20   Rhetta Mura, MD     This patient is critically ill with multiple organ system failure; which, requires frequent high complexity decision making, assessment, support, evaluation, and titration of therapies. This was completed through the application of advanced monitoring technologies and extensive interpretation of multiple databases. During this encounter critical care time was devoted to patient care services described in this note for 33 minutes.  Josephine Igo, DO [facility] 08/26/2020 10:33 PM

## 2020-08-26 NOTE — Progress Notes (Addendum)
eLink Physician-Brief Progress Note Patient Name: Renalda Locklin DOB: 1956/03/10 MRN: [id number]   Date of Service  08/26/2020  HPI/Events of Note  Patient admitted with hypotension, elevated lactic acid and a history of recent admission for enterocolitis, r/o sepsis vs other etiology of the hypotension and lactemia. Work up is proceeding in parallel with ongoing volume resuscitation, empiric antibiotic coverage.  eICU Interventions  New Patient Evaluation completed. Arterial line ordered for more reliable BP measurements, Bair Hugger ordered for hypothermia.        Thomasene Lot Winford Hehn 08/26/2020, 11:43 PM

## 2020-08-26 NOTE — ED Notes (Signed)
Pt BIB EMS. Pt was discharged a week ago due to having a UTI. Pt was sent home with abx prescription but has not taken any since she was discharge. Pt daughter called EMS due to patient having altered mental status. Pt lives with daughter.   BP-82/40 HR-132 CBG-108

## 2020-08-26 NOTE — ED Provider Notes (Signed)
[facility]-EMERGENCY DEPT Provider Note   CSN: [id number] Arrival date & time: 08/26/20  1716     History Chief Complaint  Patient presents with  . Altered Mental Status    Amber [name] is a 64 y.o. female.  Patient with history of stroke, seizures, CAD who presents to the ED with altered mental status.  Found to be hypotensive with EMS.  Recently admitted for urinary tract infection/colitis but sounds like she did not fill her antibiotic prescription.  She states that her daughter did not pick it up for her.  She also states that she has not been on her prednisone for several weeks as well.  She states that she had chest pain, abdominal pain, diarrhea.  She has chronic upper back pain.  She denies any falls.  No obvious urinary symptoms.  The history is provided by the patient.  Illness Severity:  Mild Onset quality:  Gradual Timing:  Constant Progression:  Unchanged Chronicity:  New Associated symptoms: abdominal pain, chest pain and shortness of breath   Associated symptoms: no cough, no ear pain, no fever, no rash, no sore throat and no vomiting        Past Medical History:  Diagnosis Date  . ACS (acute coronary syndrome) (HCC) 06/12/2015  . Acute on chronic blood loss anemia 11/25/2017  . Arthritis   . Asthma   . 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 CHF (congestive heart failure) (HCC)   . Chronic pain   . Constipation   . Diabetes mellitus without complication (HCC)   . Duodenitis   . Embolic stroke (HCC)   . Falls frequently   . Focal seizure (HCC)   . GERD (gastroesophageal reflux disease)   . Hypertension   . Impacted fracture 02/15/2017   right glenoid  . Pneumonia 10/2015  . Seizures (HCC)    last seizure March 2015  . Small bowel obstruction (HCC) 11/25/2017    Patient Active Problem List   Diagnosis Date Noted  . Colitis, acute 08/16/2020  . Sepsis due to gram-negative UTI (HCC) 08/15/2020  . UTI  (urinary tract infection) 03/25/2020  . SIRS (systemic inflammatory response syndrome) (HCC) 03/25/2020  . Abdominal pain 03/25/2020  . Diarrhea 03/25/2020  . CVA (cerebral vascular accident) (HCC) 02/13/2020  . Hyperkalemia   . Stroke (HCC) 12/25/2019  . Acute ischemic stroke (HCC) 12/24/2019  . Chronic diastolic (congestive) heart failure (HCC) 02/05/2019  . CKD (chronic kidney disease) stage 3, GFR 30-59 ml/min 02/05/2019  . Chronic anticoagulation 02/05/2019  . Palliative care encounter   . Septic shock (HCC) 12/29/2018  . Pressure injury of skin 10/11/2018  . Sepsis due to pneumonia (HCC) 10/10/2018  . Failure to thrive in adult 10/10/2018  . Rheumatoid arthritis (HCC) 10/10/2018  . Anasarca 10/10/2018  . HCAP (healthcare-associated pneumonia) 09/24/2018  . Acute metabolic encephalopathy 05/09/2018  . Metabolic acidosis 05/09/2018  . Mild persistent asthma 05/09/2018  . 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 Moderate malnutrition (HCC) 05/07/2018  . Chronic anemia 05/06/2018  . PFO (patent foramen ovale) 02/15/2017  . History of CVA with residual deficit 02/15/2017  . Drug-induced hyperglycemia 02/15/2017  . Chest pain 11/23/2016  . Seizures (HCC) 10/26/2016  . 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 Sepsis (HCC) 02/29/2016  . Constipation 11/07/2015  . Hypomagnesemia 08/25/2015  . Diabetes mellitus type 2, controlled (HCC) 07/27/2015  . Chronic back pain   . Frequent falls   . Hypertensive heart disease   .  Pulmonary hypertension (HCC)   . Gout   . Microcytic anemia 01/26/2015  . History of seizure 01/26/2015    Past Surgical History:  Procedure Laterality Date  . APPENDECTOMY    . BACK SURGERY    . CHOLECYSTECTOMY    . TEE WITHOUT CARDIOVERSION N/A 10/30/2016   Procedure: TRANSESOPHAGEAL ECHOCARDIOGRAM (TEE);  Surgeon: Lars Masson, MD;  Location: [facility] ENDOSCOPY;  Service: Cardiovascular;  Laterality: N/A;     OB History   No obstetric  history on file.     Family History  Problem Relation Age of Onset  . Kidney failure Mother   . Hypertension Sister   . Hypertension Brother     Social History   Tobacco Use  . Smoking status: Former Smoker    Quit date: 2010    Years since quitting: 11.7  . Smokeless tobacco: Never Used  Vaping Use  . Vaping Use: Never used  Substance Use Topics  . Alcohol use: Not Currently  . Drug use: No    Home Medications Prior to Admission medications   Medication Sig Start Date End Date Taking? Authorizing Provider  acetaminophen (TYLENOL) 500 MG tablet Take 2 tablets (1,000 mg total) by mouth every 8 (eight) hours. Patient taking differently: Take 1,000 mg by mouth every 8 (eight) hours as needed for mild pain or moderate pain.  12/30/19   Almon Hercules, MD  albuterol (PROVENTIL HFA;VENTOLIN HFA) 108 (90 Base) MCG/ACT inhaler Inhale 2 puffs into the lungs every 4 (four) hours as needed for wheezing or shortness of breath. 01/16/19   Glade Lloyd, MD  allopurinol (ZYLOPRIM) 300 MG tablet Take 300 mg by mouth daily. 11/11/16   [provider]  amLODipine (NORVASC) 5 MG tablet Take 10 mg by mouth daily.     [provider]  apixaban (ELIQUIS) 5 MG TABS tablet Take 1 tablet (5 mg total) by mouth 2 (two) times daily. 02/14/20   Joseph Art, DO  atorvastatin (LIPITOR) 10 MG tablet Take 10 mg by mouth daily at 6 PM.  08/13/15   [provider]  calcium-vitamin D (OSCAL WITH D) 500-200 MG-UNIT tablet Take 2 tablets by mouth daily with breakfast. 12/30/19   Almon Hercules, MD  CREON 24000-76000 units CPEP Take 1 capsule by mouth 3 (three) times daily.  09/23/19   [provider]  esomeprazole (NEXIUM) 40 MG capsule Take 40 mg by mouth 2 (two) times daily before a meal.  11/26/19   [provider]  folic acid (FOLVITE) 1 MG tablet Take 1 mg by mouth daily. 11/20/19   [provider]  linaclotide Karlene Einstein) 145 MCG CAPS capsule Take 1 capsule (145  mcg total) by mouth daily before breakfast. Patient taking differently: Take 145 mcg by mouth daily as needed.  03/29/20   Elson Areas, PA-C  metoprolol succinate (TOPROL-XL) 25 MG 24 hr tablet Take 1 tablet (25 mg total) by mouth daily. 10/15/18   Mikhail, Nita Sells, DO  nystatin-triamcinolone ointment (MYCOLOG) Apply 1 application topically 2 (two) times daily as needed for rash. 09/23/19   [provider]  Oxycodone HCl 20 MG TABS Take 0.5 tablets (10 mg total) by mouth every 6 (six) hours as needed (severe pain). Patient taking differently: Take 20 mg by mouth every 6 (six) hours.  03/27/20   Rhetta Mura, MD  polyethylene glycol (MIRALAX / GLYCOLAX) 17 g packet Take 17 g by mouth daily. Patient taking differently: Take 17 g by mouth daily as needed for  mild constipation.  03/28/20   Rhetta Mura, MD  predniSONE (DELTASONE) 5 MG tablet Take 1 tablet (5 mg total) by mouth daily with breakfast. 01/16/19   Glade Lloyd, MD  senna-docusate (SENOKOT-S) 8.6-50 MG tablet Take 1 tablet by mouth at bedtime. Patient not taking: Reported on 08/15/2020 03/27/20   Rhetta Mura, MD  valproic acid (DEPAKENE) 250 MG capsule Take 250 mg by mouth 3 (three) times daily.     [provider]    Allergies    Ivp dye [iodinated diagnostic agents] and Metrizamide  Review of Systems   Review of Systems  Constitutional: Negative for chills and fever.  HENT: Negative for ear pain and sore throat.   Eyes: Negative for pain and visual disturbance.  Respiratory: Positive for shortness of breath. Negative for cough.   Cardiovascular: Positive for chest pain. Negative for palpitations.  Gastrointestinal: Positive for abdominal pain. Negative for vomiting.  Genitourinary: Negative for dysuria and hematuria.  Musculoskeletal: Negative for arthralgias and back pain.  Skin: Negative for color change and rash.  Neurological: Negative for seizures and syncope.  All other systems  reviewed and are negative.   Physical Exam Updated Vital Signs  ED Triage Vitals [08/26/20 1803]  Enc Vitals Group     BP      Pulse      Resp      Temp 98.5 F (36.9 C)     Temp Source Rectal     SpO2      Weight      Height      Head Circumference      Peak Flow      Pain Score      Pain Loc      Pain Edu?      Excl. in GC?     Physical Exam Vitals and nursing note reviewed.  Constitutional:      General: She is not in acute distress.    Appearance: She is well-developed. She is ill-appearing.  HENT:     Head: Normocephalic and atraumatic.     Nose: Nose normal.     Mouth/Throat:     Mouth: Mucous membranes are moist.  Eyes:     Extraocular Movements: Extraocular movements intact.     Conjunctiva/sclera: Conjunctivae normal.     Pupils: Pupils are equal, round, and reactive to light.  Cardiovascular:     Rate and Rhythm: Regular rhythm. Tachycardia present.     Heart sounds: No murmur heard.   Pulmonary:     Effort: Pulmonary effort is normal. No respiratory distress.     Breath sounds: Normal breath sounds.  Abdominal:     Palpations: Abdomen is soft.     Tenderness: There is abdominal tenderness (diffuse but right > left).  Musculoskeletal:     Cervical back: Neck supple.  Skin:    General: Skin is warm and dry.     Capillary Refill: Capillary refill takes less than 2 seconds.  Neurological:     General: No focal deficit present.     Mental Status: She is alert and oriented to person, place, and time.     ED Results / Procedures / Treatments   Labs (all labs ordered are listed, but only abnormal results are displayed) Labs Reviewed  LACTIC ACID, PLASMA - Abnormal; Notable for the following components:      Result Value   Lactic Acid, Venous 6.7 (*)    All other components within normal limits  LACTIC ACID, PLASMA -  Abnormal; Notable for the following components:   Lactic Acid, Venous 6.8 (*)    All other components within normal limits   COMPREHENSIVE METABOLIC PANEL - Abnormal; Notable for the following components:   CO2 18 (*)    Creatinine, Ser 2.43 (*)    Calcium 7.6 (*)    Total Protein 5.8 (*)    Albumin 2.0 (*)    AST 48 (*)    Alkaline Phosphatase 167 (*)    Total Bilirubin 2.5 (*)    GFR calc non Af Amer 21 (*)    Anion gap 18 (*)    All other components within normal limits  CBC WITH DIFFERENTIAL/PLATELET - Abnormal; Notable for the following components:   WBC 12.0 (*)    RBC 3.04 (*)    Hemoglobin 8.1 (*)    HCT 23.7 (*)    MCV 78.0 (*)    RDW 18.8 (*)    Platelets 136 (*)    Monocytes Absolute 1.5 (*)    Abs Immature Granulocytes 0.19 (*)    All other components within normal limits  PROTIME-INR - Abnormal; Notable for the following components:   Prothrombin Time 38.6 (*)    INR 4.1 (*)    All other components within normal limits  APTT - Abnormal; Notable for the following components:   aPTT 54 (*)    All other components within normal limits  BLOOD GAS, ARTERIAL - Abnormal; Notable for the following components:   pH, Arterial 7.246 (*)    pO2, Arterial 51.5 (*)    Bicarbonate 17.1 (*)    Acid-base deficit 9.0 (*)    All other components within normal limits  D-DIMER, QUANTITATIVE (NOT AT [facility]) - Abnormal; Notable for the following components:   D-Dimer, Quant 2.66 (*)    All other components within normal limits  RESP PANEL BY RT PCR (RSV, FLU A&B, COVID)  CULTURE, BLOOD (ROUTINE X 2)  CULTURE, BLOOD (ROUTINE X 2)  URINE CULTURE  GASTROINTESTINAL PANEL BY PCR, STOOL (REPLACES STOOL CULTURE)  C DIFFICILE QUICK SCREEN W PCR REFLEX  LIPASE, BLOOD  OCCULT BLOOD X 1 CARD TO LAB, STOOL  URINALYSIS, ROUTINE W REFLEX MICROSCOPIC  POC OCCULT BLOOD, ED  TROPONIN I (HIGH SENSITIVITY)    EKG EKG Interpretation  Date/Time:  Thursday August 26 2020 17:30:30 EDT Ventricular Rate:  118 PR Interval:    QRS Duration: 65 QT Interval:  377 QTC Calculation: 529 R Axis:   29 Text  Interpretation: Sinus tachycardia Borderline repolarization abnormality Prolonged QT interval Confirmed by Virgina Norfolk [phone]) on 08/26/2020 6:59:42 PM   Radiology CT ABDOMEN PELVIS WO CONTRAST  Result Date: 08/26/2020 CLINICAL DATA:  64 year old female with chest pain and shortness of breath. EXAM: CT CHEST, ABDOMEN AND PELVIS WITHOUT CONTRAST TECHNIQUE: Multidetector CT imaging of the chest, abdomen and pelvis was performed following the standard protocol without IV contrast. COMPARISON:  Chest radiograph dated 08/26/2020 and CT of the abdomen pelvis dated 08/15/2020. CT dated 03/24/2020. FINDINGS: CT CHEST FINDINGS Evaluation of this exam is limited in the absence of intravenous contrast as well as due to streak artifact caused by spinal hardware. Cardiovascular: Top-normal cardiac size. No pericardial effusion. There is atherosclerotic calcification of the thoracic aorta. No aneurysmal dilatation. The central pulmonary arteries are grossly unremarkable. Mediastinum/Nodes: No hilar or mediastinal adenopathy. There is a small hiatal hernia. The esophagus is grossly unremarkable. No mediastinal fluid collection. Lungs/Pleura: Small bilateral pleural effusions. There is diffuse interstitial streaky and bandlike densities with peribronchial thickening,  new since the CT of 03/24/2020 and may represent edema, or pneumonia or combination. Clinical correlation is recommended. There is no pneumothorax. The central airways are patent. Musculoskeletal: Degenerative changes of the spine and several bilateral old healed rib fractures. Extensive posterior spinal fusion and prior T4-T5 corpectomy. No acute osseous pathology. CT ABDOMEN PELVIS FINDINGS No intra-abdominal free air or free fluid. Hepatobiliary: Cirrhosis. No intrahepatic biliary ductal dilatation. Cholecystectomy. Pancreas: Moderate pancreatic atrophy with scattered coarse calcification sequela of chronic pancreatitis. No active inflammatory changes.  Spleen: Normal in size without focal abnormality. Adrenals/Urinary Tract: The adrenal glands unremarkable. The kidneys, visualized ureters, and urinary bladder appear unremarkable. Stomach/Bowel: There is a small hiatal hernia. There is sigmoid diverticulosis without active inflammatory changes. Loose stool noted throughout the colon compatible with diarrheal state. Correlation with clinical exam and stool cultures recommended. There is no bowel obstruction. The appendix is not visualized with certainty. No inflammatory changes identified in the right lower quadrant. Vascular/Lymphatic: Advanced aortoiliac atherosclerotic disease. No portal venous gas. There is no adenopathy. Reproductive: The uterus is grossly unremarkable. No adnexal masses. Other: Diffuse subcutaneous soft tissue edema of the pelvis and buttock. No fluid collection. Musculoskeletal: Osteopenia with degenerative changes of the spine. No acute osseous pathology. 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 Old compression fracture of the L1 and L2 and L3. IMPRESSION: 1. Small bilateral pleural effusions with diffuse interstitial streaky and bandlike densities, new since the CT of 03/24/2020 and may represent edema, or pneumonia or combination. Clinical correlation is recommended. 2. Diarrheal state. Correlation with clinical exam and stool cultures recommended. No bowel obstruction. 3. Sigmoid diverticulosis. 4. Cirrhosis. 5. Aortic Atherosclerosis (ICD10-I70.0). Electronically Signed   By: Elgie Collard M.D.   On: 08/26/2020 19:36   CT Head Wo Contrast  Result Date: 08/26/2020 CLINICAL DATA:  Altered mental status. EXAM: CT HEAD WITHOUT CONTRAST TECHNIQUE: Contiguous axial images were obtained from the base of the skull through the vertex without intravenous contrast. COMPARISON:  02/12/2020 FINDINGS: Brain: Chronic atrophic changes and chronic white matter ischemic changes are seen similar to that noted on  the prior exam. Encephalomalacia changes are noted primarily on the right related to areas of prior infarct in the distribution of the right middle cerebral artery. No findings to suggest acute hemorrhage, acute infarction or space-occupying mass lesion are noted. Vascular: No hyperdense vessel or unexpected calcification. Skull: Normal. Negative for fracture or focal lesion. Sinuses/Orbits: No acute finding. Other: None. IMPRESSION: Chronic atrophic and ischemic changes. Encephalomalacia changes consistent with the known history of prior right MCA infarct. No acute abnormality is noted. Electronically Signed   By: Alcide Clever M.D.   On: 08/26/2020 19:30   CT Chest Wo Contrast  Result Date: 08/26/2020 CLINICAL DATA:  64 year old female with chest pain and shortness of breath. EXAM: CT CHEST, ABDOMEN AND PELVIS WITHOUT CONTRAST TECHNIQUE: Multidetector CT imaging of the chest, abdomen and pelvis was performed following the standard protocol without IV contrast. COMPARISON:  Chest radiograph dated 08/26/2020 and CT of the abdomen pelvis dated 08/15/2020. CT dated 03/24/2020. FINDINGS: CT CHEST FINDINGS Evaluation of this exam is limited in the absence of intravenous contrast as well as due to streak artifact caused by spinal hardware. Cardiovascular: Top-normal cardiac size. No pericardial effusion. There is atherosclerotic calcification of the thoracic aorta. No aneurysmal dilatation. The central pulmonary arteries are grossly unremarkable. Mediastinum/Nodes: No hilar or mediastinal adenopathy. There is a small hiatal hernia. The esophagus is grossly unremarkable. No mediastinal fluid collection. Lungs/Pleura:  Small bilateral pleural effusions. There is diffuse interstitial streaky and bandlike densities with peribronchial thickening, new since the CT of 03/24/2020 and may represent edema, or pneumonia or combination. Clinical correlation is recommended. There is no pneumothorax. The central airways are patent.  Musculoskeletal: Degenerative changes of the spine and several bilateral old healed rib fractures. Extensive posterior spinal fusion and prior T4-T5 corpectomy. No acute osseous pathology. CT ABDOMEN PELVIS FINDINGS No intra-abdominal free air or free fluid. Hepatobiliary: Cirrhosis. No intrahepatic biliary ductal dilatation. Cholecystectomy. Pancreas: Moderate pancreatic atrophy with scattered coarse calcification sequela of chronic pancreatitis. No active inflammatory changes. Spleen: Normal in size without focal abnormality. Adrenals/Urinary Tract: The adrenal glands unremarkable. The kidneys, visualized ureters, and urinary bladder appear unremarkable. Stomach/Bowel: There is a small hiatal hernia. There is sigmoid diverticulosis without active inflammatory changes. Loose stool noted throughout the colon compatible with diarrheal state. Correlation with clinical exam and stool cultures recommended. There is no bowel obstruction. The appendix is not visualized with certainty. No inflammatory changes identified in the right lower quadrant. Vascular/Lymphatic: Advanced aortoiliac atherosclerotic disease. No portal venous gas. There is no adenopathy. Reproductive: The uterus is grossly unremarkable. No adnexal masses. Other: Diffuse subcutaneous soft tissue edema of the pelvis and buttock. No fluid collection. Musculoskeletal: Osteopenia with degenerative changes of the spine. No acute osseous pathology. 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 Old compression fracture of the L1 and L2 and L3. IMPRESSION: 1. Small bilateral pleural effusions with diffuse interstitial streaky and bandlike densities, new since the CT of 03/24/2020 and may represent edema, or pneumonia or combination. Clinical correlation is recommended. 2. Diarrheal state. Correlation with clinical exam and stool cultures recommended. No bowel obstruction. 3. Sigmoid diverticulosis. 4. Cirrhosis. 5. Aortic  Atherosclerosis (ICD10-I70.0). Electronically Signed   By: Elgie Collard M.D.   On: 08/26/2020 19:36   DG Chest Port 1 View  Result Date: 08/26/2020 CLINICAL DATA:  Sepsis EXAM: PORTABLE CHEST 1 VIEW COMPARISON:  08/15/2020 FINDINGS: Lungs are clear. No pneumothorax or pleural effusion. Cardiac size is within normal limits. Cervicothoracic fusion with instrumentation is again visualized, not well assessed on this exam. Multiple remote right rib fractures are again identified. Degenerative changes are noted within the shoulders bilaterally IMPRESSION: No active disease.  No interval change. Electronically Signed   By: Helyn Numbers MD   On: 08/26/2020 19:01    Procedures .Critical Care Performed by: Virgina Norfolk, DO Authorized by: Virgina Norfolk, DO   Critical care provider statement:    Critical care time (minutes):  65   Critical care was necessary to treat or prevent imminent or life-threatening deterioration of the following conditions:  Shock   Critical care was time spent personally by me on the following activities:  Blood draw for specimens, development of treatment plan with patient or surrogate, discussions with primary provider, evaluation of patient's response to treatment, examination of patient, obtaining history from patient or surrogate, ordering and performing treatments and interventions, ordering and review of laboratory studies, ordering and review of radiographic studies, pulse oximetry, re-evaluation of patient's condition and review of old charts   I assumed direction of critical care for this patient from another provider in my specialty: no     (including critical care time)  Medications Ordered in ED Medications  sodium chloride 0.9 % bolus 1,000 mL (1,000 mLs Intravenous Not Given 08/26/20 2031)  norepinephrine (LEVOPHED)  in premix infusion (0 mcg/min Intravenous Hold 08/26/20 2110)  piperacillin-tazobactam (ZOSYN) IVPB 3.375 g (0  g Intravenous Stopped  08/26/20 1943)  hydrocortisone sodium succinate (SOLU-CORTEF) 100 MG injection 100 mg (100 mg Intravenous Given 08/26/20 1851)  sodium chloride 0.9 % bolus 2,000 mL (0 mLs Intravenous Stopped 08/26/20 1943)  sodium chloride 0.9 % bolus 500 mL (500 mLs Intravenous New Bag/Given 08/26/20 2031)  albumin human 5 % solution 25 g (25 g Intravenous New Bag/Given 08/26/20 2140)    ED Course  I have reviewed the triage vital signs and the nursing notes.  Pertinent labs & imaging results that were available during my care of the patient were reviewed by me and considered in my medical decision making (see chart for details).    MDM Rules/Calculators/A&P                          Raianna Schmuhl is a 64 year old female with history of seizures, stroke on Eliquis, CHF, hypertension, asthma on chronic prednisone who presents to the ED with altered mental status.  Patient found to be hypotensive and tachycardic.  However rectal temperature was normal.  Recently was admitted for colitis.  States that she is supposed to be on 2 antibiotics since discharge but had not been on them.  Continues to have had abdominal pain, diarrhea, shortness of breath.  Room air oxygenation is normal.  She is diffusely tender in the abdomen but worse in the left lower quadrant.  Hypotension could be secondary to sepsis.  Low suspicion for PE or pulmonary process given that she is on Eliquis and has normal room air oxygenation.  Sepsis work-up was initiated.  Stress dose hydrocortisone was given as patient states that she has been without her prednisone for several weeks and could be adrenal related as well.  She could be severely dehydrated as well.  Lactic acid was 6.7.  Therefore patient was given IV Zosyn.  Given 30 cc/kg IV fluids.  Eventually was given 3-1/2 L IV fluids.  Blood pressure gradually improved but still labile between 80s and 90s systolic.  Blood pressure was initially in the 70s systolic.  CT scan of chest, abdomen, pelvis  showed overall no acute findings.  There was diarrheal state seen on CT scan.  Urinalysis possibly infectious.  Urine culture sent.  Lactic acid continued to be elevated at 6.8.  Urine output still minimal.  ICU was called given hypotension not responsive fully to IV fluids.  Concern for adrenal suppression.  Likely sepsis/dehydration from UTI/diarrhea.  Creatinine is elevated to 2.4.  Overall will give additional albumin bolus of 500 cc.  Blood pressure stabilized more in the 90s systolic.  Patient to be admitted to the ICU overnight for further stabilization.  We will hold on pressors at this time.  Mental status has been normal throughout my care.  Overall suspect UTI/diarrhea causing sepsis and shock complicated by likely adrenal suppression from chronic prednisone use which she has not been on.  This chart was dictated using voice recognition software.  Despite best efforts to proofread,  errors can occur which can change the documentation meaning.    Final Clinical Impression(s) / ED Diagnoses Final diagnoses:  Shock (HCC)  AKI (acute kidney injury) (HCC)  Dehydration    Rx / DC Orders ED Discharge Orders    None       Virgina Norfolk, DO 08/26/20 2227

## 2020-08-26 NOTE — Progress Notes (Addendum)
Pharmacy Antibiotic Note  Amber [name] is a 64 y.o. female admitted on 08/26/2020 having been discharged a week ago due to having UTI.  Pt was sent home with abx but has not taken any since she was discharged.  Pt brought in with AMS.  Pharmacy to does zosyn for sepsis  Plan: zosyn 3.375gm IV x 1 then 2.25gm IV q6h for CrCl < 59mls/min Follow renal function, cultures and clinical coarse     Temp (24hrs), Avg:98.5 F (36.9 C), Min:98.5 F (36.9 C), Max:98.5 F (36.9 C)  Recent Labs  Lab 08/20/20 0856 08/26/20 1739 08/26/20 1750 08/26/20 1939  WBC 13.2*  --  12.0*  --   CREATININE  --   --  2.43*  --   LATICACIDVEN  --  6.7*  --  6.8*    Estimated Creatinine Clearance: 17.2 mL/min (A) (by C-G formula based on SCr of 2.43 mg/dL (H)).    Allergies  Allergen Reactions  . Ivp Dye [Iodinated Diagnostic Agents] Itching  . Metrizamide Itching     Thank you for allowing pharmacy to be a part of this patient's care.  Arley Phenix RPh 08/26/2020, 10:28 PM   Pharmacy consulted to also dose vancomycin  Plan: Vancomycin 1gm IV x 1 then 750mg  q48h Daily Scr while on vanc and zosyn Follow renal function, cultures and clincal course  RPh 08/27/2020, 12:40 AM

## 2020-08-27 ENCOUNTER — Inpatient Hospital Stay (HOSPITAL_COMMUNITY): Payer: Medicaid Other

## 2020-08-27 ENCOUNTER — Other Ambulatory Visit: Payer: Self-pay

## 2020-08-27 ENCOUNTER — Inpatient Hospital Stay: Payer: Self-pay

## 2020-08-27 ENCOUNTER — Encounter (HOSPITAL_COMMUNITY): Payer: Self-pay | Admitting: Pulmonary Disease

## 2020-08-27 DIAGNOSIS — R579 Shock, unspecified: Secondary | ICD-10-CM | POA: Diagnosis not present

## 2020-08-27 DIAGNOSIS — R6521 Severe sepsis with septic shock: Secondary | ICD-10-CM

## 2020-08-27 DIAGNOSIS — I10 Essential (primary) hypertension: Secondary | ICD-10-CM

## 2020-08-27 DIAGNOSIS — R6520 Severe sepsis without septic shock: Secondary | ICD-10-CM | POA: Diagnosis not present

## 2020-08-27 DIAGNOSIS — A419 Sepsis, unspecified organism: Secondary | ICD-10-CM | POA: Diagnosis not present

## 2020-08-27 DIAGNOSIS — N179 Acute kidney failure, unspecified: Secondary | ICD-10-CM | POA: Diagnosis not present

## 2020-08-27 LAB — BASIC METABOLIC PANEL
Anion gap: 16 — ABNORMAL HIGH (ref 5–15)
Anion gap: 18 — ABNORMAL HIGH (ref 5–15)
Anion gap: 12 (ref 5–15)
BUN: 10 mg/dL (ref 8–23)
BUN: 10 mg/dL (ref 8–23)
BUN: 10 mg/dL (ref 8–23)
CO2: 12 mmol/L — ABNORMAL LOW (ref 22–32)
CO2: 15 mmol/L — ABNORMAL LOW (ref 22–32)
CO2: 17 mmol/L — ABNORMAL LOW (ref 22–32)
Calcium: 6.3 mg/dL — CL (ref 8.9–10.3)
Calcium: 6.9 mg/dL — ABNORMAL LOW (ref 8.9–10.3)
Calcium: 6.5 mg/dL — ABNORMAL LOW (ref 8.9–10.3)
Chloride: 112 mmol/L — ABNORMAL HIGH (ref 98–111)
Chloride: 109 mmol/L (ref 98–111)
Chloride: 109 mmol/L (ref 98–111)
Creatinine, Ser: 2 mg/dL — ABNORMAL HIGH (ref 0.44–1.00)
Creatinine, Ser: 2.09 mg/dL — ABNORMAL HIGH (ref 0.44–1.00)
Creatinine, Ser: 2.15 mg/dL — ABNORMAL HIGH (ref 0.44–1.00)
GFR calc non Af Amer: 26 mL/min — ABNORMAL LOW (ref >60)
GFR calc non Af Amer: 25 mL/min — ABNORMAL LOW (ref >60)
GFR, Estimated: 24 mL/min — ABNORMAL LOW (ref >60)
Glucose, Bld: 151 mg/dL — ABNORMAL HIGH (ref 70–99)
Glucose, Bld: 167 mg/dL — ABNORMAL HIGH (ref 70–99)
Glucose, Bld: 211 mg/dL — ABNORMAL HIGH (ref 70–99)
Potassium: 3.5 mmol/L (ref 3.5–5.1)
Potassium: 3.5 mmol/L (ref 3.5–5.1)
Potassium: 3.7 mmol/L (ref 3.5–5.1)
Sodium: 140 mmol/L (ref 135–145)
Sodium: 142 mmol/L (ref 135–145)
Sodium: 138 mmol/L (ref 135–145)

## 2020-08-27 LAB — BLOOD GAS, ARTERIAL
Acid-base deficit: 10.8 mmol/L — ABNORMAL HIGH (ref 0.0–2.0)
Bicarbonate: 13.4 mmol/L — ABNORMAL LOW (ref 20.0–28.0)
Drawn by: 25770
O2 Content: 2 L/min
O2 Saturation: 95.2 %
Patient temperature: 98
pCO2 arterial: 25.8 mmHg — ABNORMAL LOW (ref 32.0–48.0)
pH, Arterial: 7.337 — ABNORMAL LOW (ref 7.350–7.450)
pO2, Arterial: 95.9 mmHg (ref 83.0–108.0)

## 2020-08-27 LAB — GASTROINTESTINAL PANEL BY PCR, STOOL (REPLACES STOOL CULTURE)

## 2020-08-27 LAB — COMPREHENSIVE METABOLIC PANEL
ALT: 14 U/L (ref 0–44)
AST: 41 U/L (ref 15–41)
Albumin: 3 g/dL — ABNORMAL LOW (ref 3.5–5.0)
Alkaline Phosphatase: 142 U/L — ABNORMAL HIGH (ref 38–126)
Anion gap: 19 — ABNORMAL HIGH (ref 5–15)
BUN: 11 mg/dL (ref 8–23)
CO2: 13 mmol/L — ABNORMAL LOW (ref 22–32)
Calcium: 6.9 mg/dL — ABNORMAL LOW (ref 8.9–10.3)
Chloride: 109 mmol/L (ref 98–111)
Creatinine, Ser: 2.13 mg/dL — ABNORMAL HIGH (ref 0.44–1.00)
GFR calc non Af Amer: 24 mL/min — ABNORMAL LOW (ref >60)
Glucose, Bld: 164 mg/dL — ABNORMAL HIGH (ref 70–99)
Potassium: 3.7 mmol/L (ref 3.5–5.1)
Sodium: 141 mmol/L (ref 135–145)
Total Bilirubin: 3.3 mg/dL — ABNORMAL HIGH (ref 0.3–1.2)
Total Protein: 5.6 g/dL — ABNORMAL LOW (ref 6.5–8.1)

## 2020-08-27 LAB — GLUCOSE, CAPILLARY
Glucose-Capillary: 146 mg/dL — ABNORMAL HIGH (ref 70–99)
Glucose-Capillary: 158 mg/dL — ABNORMAL HIGH (ref 70–99)
Glucose-Capillary: 159 mg/dL — ABNORMAL HIGH (ref 70–99)
Glucose-Capillary: 193 mg/dL — ABNORMAL HIGH (ref 70–99)
Glucose-Capillary: 202 mg/dL — ABNORMAL HIGH (ref 70–99)
Glucose-Capillary: 209 mg/dL — ABNORMAL HIGH (ref 70–99)

## 2020-08-27 LAB — CBC
HCT: 17.8 % — ABNORMAL LOW (ref 36.0–46.0)
HCT: 28.4 % — ABNORMAL LOW (ref 36.0–46.0)
Hemoglobin: 5.9 g/dL — CL (ref 12.0–15.0)
Hemoglobin: 9.8 g/dL — ABNORMAL LOW (ref 12.0–15.0)
MCH: 26.9 pg (ref 26.0–34.0)
MCH: 27.5 pg (ref 26.0–34.0)
MCHC: 33.1 g/dL (ref 30.0–36.0)
MCHC: 34.5 g/dL (ref 30.0–36.0)
MCV: 81.3 fL (ref 80.0–100.0)
MCV: 79.8 fL — ABNORMAL LOW (ref 80.0–100.0)
Platelets: 91 10*3/uL — ABNORMAL LOW (ref 150–400)
Platelets: 89 10*3/uL — ABNORMAL LOW (ref 150–400)
RBC: 2.19 MIL/uL — ABNORMAL LOW (ref 3.87–5.11)
RBC: 3.56 MIL/uL — ABNORMAL LOW (ref 3.87–5.11)
RDW: 18.9 % — ABNORMAL HIGH (ref 11.5–15.5)
RDW: 16.5 % — ABNORMAL HIGH (ref 11.5–15.5)
WBC: 8.6 10*3/uL (ref 4.0–10.5)
WBC: 7.6 10*3/uL (ref 4.0–10.5)
nRBC: 0 % (ref 0.0–0.2)
nRBC: 0 % (ref 0.0–0.2)

## 2020-08-27 LAB — RETICULOCYTES
Immature Retic Fract: 20.2 % — ABNORMAL HIGH (ref 2.3–15.9)
RBC.: 3.67 MIL/uL — ABNORMAL LOW (ref 3.87–5.11)
Retic Count, Absolute: 94 10*3/uL (ref 19.0–186.0)
Retic Ct Pct: 2.6 % (ref 0.4–3.1)

## 2020-08-27 LAB — ECHOCARDIOGRAM COMPLETE
Area-P 1/2: 3.03 cm2
S' Lateral: 2.5 cm
Weight: 1957.68 oz

## 2020-08-27 LAB — LIPASE, BLOOD: Lipase: 21 U/L (ref 11–51)

## 2020-08-27 LAB — PROTIME-INR
INR: 4.3 (ref 0.8–1.2)
Prothrombin Time: 39.7 seconds — ABNORMAL HIGH (ref 11.4–15.2)

## 2020-08-27 LAB — MRSA PCR SCREENING: MRSA by PCR: NEGATIVE

## 2020-08-27 LAB — MAGNESIUM: Magnesium: 1.2 mg/dL — ABNORMAL LOW (ref 1.7–2.4)

## 2020-08-27 LAB — LACTATE DEHYDROGENASE: LDH: 161 U/L (ref 98–192)

## 2020-08-27 LAB — PREPARE RBC (CROSSMATCH)

## 2020-08-27 LAB — LACTIC ACID, PLASMA
Lactic Acid, Venous: 2.3 mmol/L (ref 0.5–1.9)
Lactic Acid, Venous: 4.1 mmol/L (ref 0.5–1.9)
Lactic Acid, Venous: 7.5 mmol/L (ref 0.5–1.9)

## 2020-08-27 LAB — PHOSPHORUS: Phosphorus: 4.6 mg/dL (ref 2.5–4.6)

## 2020-08-27 LAB — AMYLASE: Amylase: 42 U/L (ref 28–100)

## 2020-08-27 MED ORDER — CALCIUM GLUCONATE-NACL 2-0.675 GM/100ML-% IV SOLN
2.0000 g | Freq: Once | INTRAVENOUS | Status: AC
  Administered 2020-08-27: 2000 mg via INTRAVENOUS
  Filled 2020-08-27: qty 100

## 2020-08-27 MED ORDER — VALPROATE SODIUM 500 MG/5ML IV SOLN
250.0000 mg | Freq: Three times a day (TID) | INTRAVENOUS | Status: DC
  Administered 2020-08-27 – 2020-08-29 (×7): 250 mg via INTRAVENOUS
  Filled 2020-08-27 (×12): qty 2.5

## 2020-08-27 MED ORDER — SODIUM CHLORIDE 0.9% FLUSH: 10.0000 mL | INTRAVENOUS | Status: DC | PRN

## 2020-08-27 MED ORDER — VANCOMYCIN HCL 750 MG/150ML IV SOLN: 750.0000 mg | INTRAVENOUS | Status: DC

## 2020-08-27 MED ORDER — INSULIN ASPART 100 UNIT/ML ~~LOC~~ SOLN
0.0000 [IU] | SUBCUTANEOUS | Status: DC
  Administered 2020-08-27 (×2): 1 [IU] via SUBCUTANEOUS
  Administered 2020-08-27 (×2): 2 [IU] via SUBCUTANEOUS
  Administered 2020-08-28: 1 [IU] via SUBCUTANEOUS
  Administered 2020-08-28: 2 [IU] via SUBCUTANEOUS
  Administered 2020-08-28 – 2020-09-01 (×10): 1 [IU] via SUBCUTANEOUS
  Administered 2020-09-01: 2 [IU] via SUBCUTANEOUS
  Administered 2020-09-01 (×2): 1 [IU] via SUBCUTANEOUS
  Administered 2020-09-02: 2 [IU] via SUBCUTANEOUS

## 2020-08-27 MED ORDER — PHENYLEPHRINE HCL-NACL 10-0.9 MG/250ML-% IV SOLN
0.0000 ug/min | INTRAVENOUS | Status: DC
  Administered 2020-08-27: 10 ug/min via INTRAVENOUS
  Filled 2020-08-27: qty 250

## 2020-08-27 MED ORDER — LACTATED RINGERS IV SOLN: INTRAVENOUS | Status: DC

## 2020-08-27 MED ORDER — SODIUM CHLORIDE 0.9% FLUSH
10.0000 mL | Freq: Two times a day (BID) | INTRAVENOUS | Status: DC
  Administered 2020-08-28 – 2020-08-30 (×5): 10 mL
  Administered 2020-08-31: 20 mL
  Administered 2020-08-31 – 2020-09-02 (×4): 10 mL
  Administered 2020-09-02: 20 mL
  Administered 2020-09-03 – 2020-09-09 (×11): 10 mL

## 2020-08-27 MED ORDER — SODIUM CHLORIDE 0.9 % IV BOLUS
500.0000 mL | Freq: Once | INTRAVENOUS | Status: AC
  Administered 2020-08-27: 500 mL via INTRAVENOUS

## 2020-08-27 MED ORDER — FENTANYL CITRATE (PF) 100 MCG/2ML IJ SOLN
12.5000 ug | INTRAMUSCULAR | Status: DC | PRN
  Administered 2020-08-27 – 2020-08-28 (×4): 12.5 ug via INTRAVENOUS
  Filled 2020-08-27 (×4): qty 2

## 2020-08-27 MED ORDER — VANCOMYCIN HCL IN DEXTROSE 1-5 GM/200ML-% IV SOLN
1000.0000 mg | Freq: Once | INTRAVENOUS | Status: AC
  Administered 2020-08-27: 1000 mg via INTRAVENOUS
  Filled 2020-08-27: qty 200

## 2020-08-27 MED ORDER — SODIUM BICARBONATE 8.4 % IV SOLN
INTRAVENOUS | Status: DC
  Filled 2020-08-27 (×6): qty 100

## 2020-08-27 MED ORDER — POTASSIUM CHLORIDE 10 MEQ/100ML IV SOLN
INTRAVENOUS | Status: AC
  Filled 2020-08-27: qty 100

## 2020-08-27 MED ORDER — SODIUM CHLORIDE 0.9% IV SOLUTION: Freq: Once | INTRAVENOUS | Status: AC

## 2020-08-27 MED ORDER — ALBUMIN HUMAN 5 % IV SOLN
25.0000 g | Freq: Once | INTRAVENOUS | Status: AC
  Administered 2020-08-27: 25 g via INTRAVENOUS
  Filled 2020-08-27: qty 500

## 2020-08-27 MED ORDER — MAGNESIUM SULFATE 4 GM/100ML IV SOLN
4.0000 g | Freq: Once | INTRAVENOUS | Status: AC
  Administered 2020-08-27: 4 g via INTRAVENOUS
  Filled 2020-08-27: qty 100

## 2020-08-27 MED ORDER — POTASSIUM CHLORIDE 10 MEQ/100ML IV SOLN
10.0000 meq | INTRAVENOUS | Status: AC
  Administered 2020-08-27 (×4): 10 meq via INTRAVENOUS

## 2020-08-27 NOTE — Progress Notes (Signed)
NAME:  Amber [name], MRN:  [id number], DOB:  [birth year], LOS: 1 ADMISSION DATE:  08/26/2020, CONSULTATION DATE: 08/26/2020 REFERRING MD: Dr. Lockie Mola, CHIEF COMPLAINT: Hypertension  Brief History   64 year old female history of CAD, CVA, type 2 diabetes, small bowel obstruction, hypertension, gastroesophageal reflux, seizure.  Presents to the ER with diarrhea and weakness.  Hypotensive with fluid resuscitation.  Pulmonary critical care consulted for management and concern for sepsis.  Past Medical History   Past Medical History:  Diagnosis Date  . ACS (acute coronary syndrome) (HCC) 06/12/2015  . Acute on chronic blood loss anemia 11/25/2017  . Arthritis   . Asthma   . 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 CHF (congestive heart failure) (HCC)   . Chronic pain   . Constipation   . Diabetes mellitus without complication (HCC)   . Duodenitis   . Embolic stroke (HCC)   . Falls frequently   . Focal seizure (HCC)   . GERD (gastroesophageal reflux disease)   . Hypertension   . Impacted fracture 02/15/2017   right glenoid  . Pneumonia 10/2015  . Seizures (HCC)    last seizure March 2015  . Small bowel obstruction (HCC) 11/25/2017     Significant Hospital Events   ICU admission 10/7   Presented to the ER with diarrhea and weakness. Hypotensive in spite of IVFs so PCCM consulted. CT abd w/out clear inflammatory changes. Elevated lactate. Pressors required.  10/8: vanc added. On 2 pressors. Repeat hgb down to 5.9 no clear evidence of bleeding. 2 units of blood ordered. abd more distended. Repeat CT abd/pelvis ordered. Repeat lipase ordered for "stigmata of recurrent pancreatitis" this was negative.  Consults:  Pulmonary critical care  Procedures:    Significant Diagnostic Tests:  CT head: Chronic atrophic changes, encephalomalacia of the right history with prior MCA. CT chest, abdomen and pelvis 10/7:Small bilateral pleural effusion, few bandlike densities,  sigmoid diverticulosis loose stool within the colon no obvious inflammatory changes. lipae 10/7: neg (22) repeat lipase 10/8: negative (21) Repeat CT abd/pelvis 10/8>>>  Micro Data:  C. difficile 10/7: neg GI panel panel 10/7>>> COVID-19 negative  Antimicrobials:  Piperacillin tazobactam 10/7>>>  Interim history/subjective:  C/o pain in extremities   Objective   Blood pressure (Abnormal) 78/45, pulse (Abnormal) 112, temperature 97.8 F (36.6 C), temperature source Axillary, resp. rate (Abnormal) 23, weight 55.5 kg, SpO2 100 %.        Intake/Output Summary (Last 24 hours) at 08/27/2020 4010 Last data filed at 08/27/2020 0630 Gross per 24 hour  Intake 2233.55 ml  Output 200 ml  Net 2033.55 ml   Filed Weights   08/27/20 0316  Weight: 55.5 kg    Examination:  General this is a 64 year old female who looks acutely ill and much older than stated age HENT NCAT but MM are dry. No JVD Pulm 2 lpm crackles both bases. No accessory use currently Card tachy rrr no MRG abd distended. Firm. Dull percussion hypoactive  Ext warm dry has pain in all extremities. Pulses are palp Neuro awake oriented but does have some expressive aphasia gu cnc urine w/ cloudy sediment   Resolved Hospital Problem list     Assessment & Plan:   Septic shock w/ lactic acidosis. Source is not clear. Urine looks suspect but abd more distended. Certainly source somewhere below the chest w/ primary ddx Urinary sepsis vs abd sepsis Plan Repeating CT abd/pelvis Getting blood for volume Ck CVP once PICC line in  Cont  vanc day 1 and zosyn day 2 Titrate norepi for MAP > 65 F/u pending cultures Cont stress dose steroids  Mixed metabolic acidosis. Appears to be both anion and non-anion gap related. 2/2 lactic acidosis c/b NaCl resuscitation and bicarb loss from diarrhea -acid base actually a little worse -last LA was increased Plan Repeat LA abg Repeat chemistry.  Dc NaCL->may need bicarb gtt will  change to LR for now  AKI secondary to above->has improved some w/ resuscitation efforts Plan: Cont IVFs Avoid nephrotoxins Serial chemistries Keep foley in place Ck renal US  Fluid and electrolyte imbalance: hypokalemia and hypomagnesemia  Plan Replace K & Mg Serial chemistries  Coagulopathy ->was on apixaban for afib ?  Plan Holding apixiban  Got vit K yesterday, will repeat INR-->if still > 2 will give FFP as still looks like needs volume   Thrombocytopenia likely 2/2 sepsis Plan Serial cbcs  Acute metabolic encephalopathy superimposed on prior CVA likely contributing to expressive aphasia  Plan Supportive care  Anemia of unclear etiology->looks like baseline around 7.4 to 8 -down to 5.9-->dilutional?  Plan F.u cbc   Seizure disorder Plan Cont PTA valproic acid   Hyperglycemia  Plan ssi     Best practice:  Diet: Clears Pain/Anxiety/Delirium protocol (if indicated): Not applicable VAP protocol (if indicated): Not applicable DVT prophylaxis: Holding due to elevated INR GI prophylaxis: Not applicable Glucose control: CBGs Mobility: Bedrest Code Status: Full Family Communication: We will update family Disposition: ICU  My cct 45 min  Simonne Martinet ACNP-BC [facility] Pulmonary/Critical Care Pager # [phone] OR # [phone] if no answer

## 2020-08-27 NOTE — Progress Notes (Signed)
CRITICAL VALUE ALERT  Critical Value:  HGB 5.9 Ca 6.3  Date & Time Notied:  08/27/2020 03:20 am   Provider Notified: E-link notified  Orders Received/Actions taken: 2 units of blood and 2 gm of Calcium Gluconate ordered

## 2020-08-27 NOTE — Progress Notes (Signed)
eLink Physician-Brief Progress Note Patient Name: Amber [name] DOB: 26-Jul-1956 MRN: [id number]   Date of Service  08/27/2020  HPI/Events of Note  Patient with persistent hypotension despite aggressive fluid resuscitation, now has abdominal distention and pain r/o acute abdomen, non-contrast CT scan done last night was unremarkable for acute actionable intra-abdominal process. Patient did have stigmata of recurrent pancreatitis.  eICU Interventions  Will repeat CT abdomen / Pelvis w/o contrast as well as obtain lipase and amylase.        Amber [name] 08/27/2020, 6:57 AM

## 2020-08-27 NOTE — TOC Initial Note (Signed)
Transition of Care [facility]) - Initial/Assessment Note    Patient Details  Name: Amber [name] MRN: [id number] Date of Birth: 03/23/1956  Transition of Care [facility] CM/SW Contact:    Golda Acre, RN Phone Number: 08/27/2020, 8:58 AM  Clinical Narrative:                 64 year old female past medical history of coronary artery disease, CVA, type 2 diabetes, small bowel obstruction, hypertension, gastroesophageal reflux, seizure, asthma on chronic steroids.  Presented to the ER with diarrhea and weakness.  Recent hospitalization for enterocolitis treated with antibiotics.  Initial presentation to the ER revealed hypotension status post fluid resuscitation.  Patient remained hypotensive however mental status was good.  She was awake alert following commands.   hgb 10082021=5.9 rec';d opne unit prbc, ca+=6.3, a.lines in place. Patient has a legal guardian for decisions.       Patient Goals and CMS Choice        Expected Discharge Plan and Services                                                Prior Living Arrangements/Services                       Activities of Daily Living Home Assistive Devices/Equipment: Environmental consultant (specify type), Cane (specify quad or straight), Dentures (specify type) (upper and lower partial plates) ADL Screening (condition at time of admission) Patient's cognitive ability adequate to safely complete daily activities?: No Is the patient deaf or have difficulty hearing?: No Does the patient have difficulty seeing, even when wearing glasses/contacts?: No Does the patient have difficulty concentrating, remembering, or making decisions?: Yes Patient able to express need for assistance with ADLs?: Yes Does the patient have difficulty dressing or bathing?: Yes Independently performs ADLs?: No Communication: Independent Dressing (OT): Needs assistance Is this a change from baseline?: Pre-admission baseline Grooming: Needs assistance Is  this a change from baseline?: Pre-admission baseline Feeding: Needs assistance Is this a change from baseline?: Pre-admission baseline Bathing: Needs assistance Is this a change from baseline?: Pre-admission baseline Toileting: Needs assistance Is this a change from baseline?: Pre-admission baseline In/Out Bed: Needs assistance Is this a change from baseline?: Pre-admission baseline Walks in Home: Dependent Is this a change from baseline?: Pre-admission baseline Does the patient have difficulty walking or climbing stairs?: Yes Weakness of Legs: Both Weakness of Arms/Hands: Both  Permission Sought/Granted                  Emotional Assessment              Admission diagnosis:  Dehydration [E86.0] Shock (HCC) [R57.9] AKI (acute kidney injury) (HCC) [N17.9] Severe sepsis (HCC) [A41.9, R65.20] Patient Active Problem List   Diagnosis Date Noted  . Severe sepsis (HCC) 08/26/2020  . Colitis, acute 08/16/2020  . Sepsis due to gram-negative UTI (HCC) 08/15/2020  . UTI (urinary tract infection) 03/25/2020  . SIRS (systemic inflammatory response syndrome) (HCC) 03/25/2020  . Abdominal pain 03/25/2020  . Diarrhea 03/25/2020  . CVA (cerebral vascular accident) (HCC) 02/13/2020  . Hyperkalemia   . Stroke (HCC) 12/25/2019  . Acute ischemic stroke (HCC) 12/24/2019  . Chronic diastolic (congestive) heart failure (HCC) 02/05/2019  . CKD (chronic kidney disease) stage 3, GFR 30-59 ml/min 02/05/2019  . Chronic anticoagulation 02/05/2019  . Palliative care  encounter   . Septic shock (HCC) 12/29/2018  . Pressure injury of skin 10/11/2018  . Sepsis due to pneumonia (HCC) 10/10/2018  . Failure to thrive in adult 10/10/2018  . Rheumatoid arthritis (HCC) 10/10/2018  . Anasarca 10/10/2018  . HCAP (healthcare-associated pneumonia) 09/24/2018  . Acute metabolic encephalopathy 05/09/2018  . Metabolic acidosis 05/09/2018  . Mild persistent asthma 05/09/2018  . 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 Moderate malnutrition (HCC) 05/07/2018  . Chronic anemia 05/06/2018  . PFO (patent foramen ovale) 02/15/2017  . History of CVA with residual deficit 02/15/2017  . Drug-induced hyperglycemia 02/15/2017  . Chest pain 11/23/2016  . Seizures (HCC) 10/26/2016  . 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 Sepsis (HCC) 02/29/2016  . Constipation 11/07/2015  . Hypomagnesemia 08/25/2015  . Diabetes mellitus type 2, controlled (HCC) 07/27/2015  . Chronic back pain   . Frequent falls   . Hypertensive heart disease   . Pulmonary hypertension (HCC)   . Gout   . Microcytic anemia 01/26/2015  . History of seizure 01/26/2015   PCP:  [name], MD Pharmacy:   [id number] - [place], [place] - [address] [place] [address] Phone: [phone] Fax: [phone]     Social Determinants of Health (SDOH) Interventions    Readmission Risk Interventions No flowsheet data found.

## 2020-08-27 NOTE — Progress Notes (Signed)
eLink Physician-Brief Progress Note Patient Name: Amber [name] DOB: 11-09-1956 MRN: [id number]   Date of Service  08/27/2020  HPI/Events of Note  Persistent hypotension despite aggressive fluid resuscitation and peripheral Norepinephrine.  eICU Interventions  NS 500 ml iv bolus x 1, peripheral phenylephrine added, Anesthesia in house requested to help with arterial line for more reliable BP monitoring.        Envi Eagleson U Koraima Albertsen 08/27/2020, 2:04 AM

## 2020-08-27 NOTE — Progress Notes (Signed)
Notified Lab that ABG being sent for analysis. 

## 2020-08-27 NOTE — Procedures (Signed)
Arterial Catheter Insertion Procedure Note  Amber [name]  [id number]  [birth year]  Date:08/27/20  Time:4:41 AM   Provider Performing: Rachel Bo Khoa Opdahl   Procedure: Insertion of Arterial Line (56387) with US guidance (56433)   Indication(s) Blood pressure monitoring and/or need for frequent ABGs  Consent Risks of the procedure as well as the alternatives and risks of each were explained to the patient and/or caregiver.  Consent for the procedure was obtained and is signed in the bedside chart  Anesthesia None  Time Out Verified patient identification, verified procedure, site/side was marked, verified correct patient position, special equipment/implants available, medications/allergies/relevant history reviewed, required imaging and test results available.  Sterile Technique Maximal sterile technique including full sterile barrier drape, hand hygiene, sterile gown, sterile gloves, mask, hair covering, sterile ultrasound probe cover (if used).  Procedure Description Area of catheter insertion was cleaned with chlorhexidine and draped in sterile fashion. With real-time ultrasound guidance an arterial catheter was placed into the left radial artery.  Appropriate arterial tracings confirmed on monitor.    Complications/Tolerance None; patient tolerated the procedure well.  EBL Minimal  Specimen(s) None  Josephine Igo, DO [place] [facility] 08/27/2020 4:41 AM

## 2020-08-27 NOTE — Progress Notes (Signed)
Placing arterial line by RT unsuccessful despite multiple attempts by Respiratory therapy to place arterial line in Pt.  Dr. Warrick Parisian notified and decision made to closely monitor the Pt at this time since the Pt's blood pressure has come up and is registering better with the blood pressure cuff once again.

## 2020-08-27 NOTE — Progress Notes (Signed)
PCCM:  Patient seen an evaluated.  She is awake, alert, following commands Was on 2 pressors, NEPI and Neo with cuff pressures all over the place  Her lactic remains elevated  Hgb repeat was 5.9, but she has no signs of active bleeding anywhere Ct abd on admission benign   Bedside echo, with normal/ slightly hyperdynamic LV pattern, RV no well visualized Formal echo pending   PRBCs already ordered Please check hgb after  A-line placed Was able to turn off pressors briefly as initial SBP 130s with art line  May still require low dose Patient tired of being poked  May still need cvl at somepoint but not interested at the moment.   Josephine Igo, DO [place] [facility] 08/27/2020 4:40 AM

## 2020-08-27 NOTE — Progress Notes (Addendum)
eLink Physician-Brief Progress Note Patient Name: Amber [name] DOB: 1956/07/04 MRN: [id number]   Date of Service  08/27/2020  HPI/Events of Note  Patient with persistent hypotension despite adequate fluid resuscitation, etiology of presumed sepsis unknown.  eICU Interventions  Vancomycin ordered to broaden empiric coverage pending culture results, NS 500 mol iv bolus x 1.        [name] 08/27/2020, 12:07 AM

## 2020-08-27 NOTE — Progress Notes (Signed)
eLink Physician-Brief Progress Note Patient Name: Bonnell Placzek DOB: 02-13-1956 MRN: [id number]   Date of Service  08/27/2020  HPI/Events of Note  Corrected serum Calcium 7.9 gm/dl.  eICU Interventions  Given patient intractable hypotension will treat with 2 gm of Calcium gluconate.        Thomasene Lot Aric Jost 08/27/2020, 3:27 AM

## 2020-08-27 NOTE — Progress Notes (Signed)
Dear Doctor:  This patient has been identified as a candidate for PICC for the following reason (s): IV therapy over 48 hours If you agree, please write an order for the indicated device.   Thank you for supporting the early vascular access assessment program. 

## 2020-08-27 NOTE — Progress Notes (Signed)
Nutrition Brief Note  Patient identified on the Malnutrition Screening Tool (MST) Report. Per report, patient was unsure about recent weight loss and reported that she has had a good appetite.   Wt Readings from Last 15 Encounters:  08/27/20 55.5 kg  08/19/20 51 kg  05/28/20 52.2 kg  03/29/20 54.4 kg  03/24/20 54.4 kg  02/12/20 45.6 kg  12/24/19 47.6 kg  01/16/19 39.9 kg  10/14/18 40.5 kg  10/01/18 46.2 kg  07/31/18 59 kg  05/07/18 43.9 kg  11/26/17 52.8 kg  10/20/17 47.6 kg  11/23/16 49.4 kg    Body mass index is 23.9 kg/m. Patient meets criteria for normal weight based on current BMI. Weight today is 122 lb which is up from all other weights in the chart. Flow sheet indicates very deep pitting edema to BUE and moderate pitting edema to BLE; suspect this is the cause of weight gain.   Current diet order is NPO and she has been NPO since admission. Labs and medications reviewed.   No nutrition interventions warranted at this time. If nutrition issues arise, please consult RD.       Trenton Gammon, MS, RD, LDN, CNSC Inpatient Clinical Dietitian RD pager # available in [facility]  After hours/weekend pager # available in [facility]

## 2020-08-27 NOTE — Progress Notes (Signed)
  Echocardiogram 2D Echocardiogram has been performed.  Augustine Radar 08/27/2020, 12:20 PM

## 2020-08-27 NOTE — H&P (Signed)
Please see consult note dated 08/26/2020  Josephine Igo, DO [place] [facility] 08/27/2020 4:43 AM

## 2020-08-27 NOTE — Progress Notes (Signed)
Peripherally Inserted Central Catheter Placement  The IV Nurse has discussed with the patient and/or persons authorized to consent for the patient, the purpose of this procedure and the potential benefits and risks involved with this procedure.  The benefits include less needle sticks, lab draws from the catheter, and the patient may be discharged home with the catheter. Risks include, but not limited to, infection, bleeding, blood clot (thrombus formation), and puncture of an artery; nerve damage and irregular heartbeat and possibility to perform a PICC exchange if needed/ordered by physician.  Alternatives to this procedure were also discussed.  Bard Power PICC patient education guide, fact sheet on infection prevention and patient information card has been provided to patient /or left at bedside.    PICC Placement Documentation  PICC Double Lumen 08/27/20 PICC Right Brachial 35 cm 1 cm (Active)  Indication for Insertion or Continuance of Line Poor Vasculature-patient has had multiple peripheral attempts or PIVs lasting less than 24 hours 08/27/20 1655  Exposed Catheter (cm) 1 cm 08/27/20 1655  Site Assessment Clean;Dry;Intact 08/27/20 1655  Lumen #1 Status Flushed;Saline locked;Blood return noted 08/27/20 1655  Lumen #2 Status Flushed;Saline locked;Blood return noted 08/27/20 1655  Dressing Type Transparent;Securing device 08/27/20 1655  Dressing Status Clean;Dry;Intact 08/27/20 1655  Antimicrobial disc in place? Yes 08/27/20 1655  Safety Lock Not Applicable 08/27/20 1655  Dressing Intervention New dressing 08/27/20 1655  Dressing Change Due 09/03/20 08/27/20 1655       Amber [name] 08/27/2020, 5:13 PM

## 2020-08-27 NOTE — Progress Notes (Signed)
CRITICAL VALUE ALERT  Critical Value:  Lactic acid 4.1 and INR 4.3  Date & Time Notied: 08/27/2020 0940  Provider Notified: Dr. Vassie Loll at bedside and made aware  Orders Received/Actions taken: New orders recieved

## 2020-08-28 ENCOUNTER — Inpatient Hospital Stay (HOSPITAL_COMMUNITY): Payer: Medicaid Other

## 2020-08-28 DIAGNOSIS — A419 Sepsis, unspecified organism: Secondary | ICD-10-CM | POA: Diagnosis not present

## 2020-08-28 DIAGNOSIS — R569 Unspecified convulsions: Secondary | ICD-10-CM

## 2020-08-28 DIAGNOSIS — N179 Acute kidney failure, unspecified: Secondary | ICD-10-CM | POA: Diagnosis not present

## 2020-08-28 DIAGNOSIS — R6521 Severe sepsis with septic shock: Secondary | ICD-10-CM | POA: Diagnosis not present

## 2020-08-28 LAB — BPAM RBC
Blood Product Expiration Date: 202111012359
Blood Product Expiration Date: 202111012359
ISSUE DATE / TIME: 202110080436
ISSUE DATE / TIME: 202110080628
Unit Type and Rh: 5100
Unit Type and Rh: 5100

## 2020-08-28 LAB — CBC
HCT: 28.7 % — ABNORMAL LOW (ref 36.0–46.0)
Hemoglobin: 10.1 g/dL — ABNORMAL LOW (ref 12.0–15.0)
MCH: 27.3 pg (ref 26.0–34.0)
MCHC: 35.2 g/dL (ref 30.0–36.0)
MCV: 77.6 fL — ABNORMAL LOW (ref 80.0–100.0)
Platelets: 86 10*3/uL — ABNORMAL LOW (ref 150–400)
RBC: 3.7 MIL/uL — ABNORMAL LOW (ref 3.87–5.11)
RDW: 16.7 % — ABNORMAL HIGH (ref 11.5–15.5)
WBC: 9.3 10*3/uL (ref 4.0–10.5)
nRBC: 0 % (ref 0.0–0.2)

## 2020-08-28 LAB — COMPREHENSIVE METABOLIC PANEL
ALT: 13 U/L (ref 0–44)
AST: 51 U/L — ABNORMAL HIGH (ref 15–41)
Albumin: 2.4 g/dL — ABNORMAL LOW (ref 3.5–5.0)
Alkaline Phosphatase: 217 U/L — ABNORMAL HIGH (ref 38–126)
Anion gap: 12 (ref 5–15)
BUN: 12 mg/dL (ref 8–23)
CO2: 19 mmol/L — ABNORMAL LOW (ref 22–32)
Calcium: 6.3 mg/dL — CL (ref 8.9–10.3)
Chloride: 104 mmol/L (ref 98–111)
Creatinine, Ser: 2.23 mg/dL — ABNORMAL HIGH (ref 0.44–1.00)
GFR, Estimated: 23 mL/min — ABNORMAL LOW (ref >60)
Glucose, Bld: 206 mg/dL — ABNORMAL HIGH (ref 70–99)
Potassium: 3.5 mmol/L (ref 3.5–5.1)
Sodium: 135 mmol/L (ref 135–145)
Total Bilirubin: 2.9 mg/dL — ABNORMAL HIGH (ref 0.3–1.2)
Total Protein: 5.2 g/dL — ABNORMAL LOW (ref 6.5–8.1)

## 2020-08-28 LAB — GLUCOSE, CAPILLARY
Glucose-Capillary: 110 mg/dL — ABNORMAL HIGH (ref 70–99)
Glucose-Capillary: 118 mg/dL — ABNORMAL HIGH (ref 70–99)
Glucose-Capillary: 131 mg/dL — ABNORMAL HIGH (ref 70–99)
Glucose-Capillary: 156 mg/dL — ABNORMAL HIGH (ref 70–99)
Glucose-Capillary: 162 mg/dL — ABNORMAL HIGH (ref 70–99)
Glucose-Capillary: 179 mg/dL — ABNORMAL HIGH (ref 70–99)
Glucose-Capillary: 209 mg/dL — ABNORMAL HIGH (ref 70–99)

## 2020-08-28 LAB — BASIC METABOLIC PANEL
Anion gap: 15 (ref 5–15)
BUN: 13 mg/dL (ref 8–23)
CO2: 22 mmol/L (ref 22–32)
Calcium: 6.8 mg/dL — ABNORMAL LOW (ref 8.9–10.3)
Chloride: 104 mmol/L (ref 98–111)
Creatinine, Ser: 2.3 mg/dL — ABNORMAL HIGH (ref 0.44–1.00)
GFR, Estimated: 22 mL/min — ABNORMAL LOW (ref >60)
Glucose, Bld: 139 mg/dL — ABNORMAL HIGH (ref 70–99)
Potassium: 3.2 mmol/L — ABNORMAL LOW (ref 3.5–5.1)
Sodium: 141 mmol/L (ref 135–145)

## 2020-08-28 LAB — URINE CULTURE: Culture: NO GROWTH

## 2020-08-28 LAB — TYPE AND SCREEN
ABO/RH(D): O POS
Antibody Screen: NEGATIVE
Unit division: 0
Unit division: 0

## 2020-08-28 LAB — VALPROIC ACID LEVEL: Valproic Acid Lvl: 49 ug/mL — ABNORMAL LOW (ref 50.0–100.0)

## 2020-08-28 LAB — SEDIMENTATION RATE: Sed Rate: 20 mm/hr (ref 0–22)

## 2020-08-28 MED ORDER — LORAZEPAM 2 MG/ML IJ SOLN
1.0000 mg | Freq: Once | INTRAMUSCULAR | Status: AC
  Administered 2020-08-28: 1 mg via INTRAVENOUS

## 2020-08-28 MED ORDER — SODIUM CHLORIDE 0.9 % IV SOLN: INTRAVENOUS | Status: DC

## 2020-08-28 MED ORDER — NALOXONE HCL 0.4 MG/ML IJ SOLN: 0.4000 mg | Freq: Once | INTRAMUSCULAR | Status: AC

## 2020-08-28 MED ORDER — LORAZEPAM 2 MG/ML IJ SOLN
INTRAMUSCULAR | Status: AC
  Filled 2020-08-28: qty 1

## 2020-08-28 MED ORDER — LEVETIRACETAM IN NACL 500 MG/100ML IV SOLN
500.0000 mg | Freq: Two times a day (BID) | INTRAVENOUS | Status: DC
  Administered 2020-08-28 – 2020-08-29 (×2): 500 mg via INTRAVENOUS
  Filled 2020-08-28 (×2): qty 100

## 2020-08-28 MED ORDER — NALOXONE HCL 0.4 MG/ML IJ SOLN
INTRAMUSCULAR | Status: AC
  Filled 2020-08-28: qty 1

## 2020-08-28 MED ORDER — ALBUMIN HUMAN 5 % IV SOLN
25.0000 g | Freq: Once | INTRAVENOUS | Status: AC
  Administered 2020-08-28: 25 g via INTRAVENOUS
  Filled 2020-08-28: qty 500

## 2020-08-28 MED ORDER — VITAMIN K1 10 MG/ML IJ SOLN
10.0000 mg | Freq: Once | INTRAVENOUS | Status: AC
  Administered 2020-08-28: 10 mg via INTRAVENOUS
  Filled 2020-08-28: qty 1

## 2020-08-28 MED ORDER — LEVETIRACETAM IN NACL 1000 MG/100ML IV SOLN
1000.0000 mg | Freq: Once | INTRAVENOUS | Status: AC
  Administered 2020-08-28: 1000 mg via INTRAVENOUS
  Filled 2020-08-28: qty 100

## 2020-08-28 MED ORDER — LORAZEPAM 2 MG/ML IJ SOLN
INTRAMUSCULAR | Status: AC
  Administered 2020-08-28 (×2): 1 mg
  Filled 2020-08-28: qty 1

## 2020-08-28 MED ORDER — CALCIUM GLUCONATE-NACL 1-0.675 GM/50ML-% IV SOLN
1.0000 g | Freq: Once | INTRAVENOUS | Status: AC
  Administered 2020-08-28: 1000 mg via INTRAVENOUS
  Filled 2020-08-28: qty 50

## 2020-08-28 MED ORDER — NALOXONE NEWBORN-WH INJECTION 0.4 MG/ML
0.4000 mg | Freq: Once | INTRAMUSCULAR | Status: AC
  Administered 2020-08-28: 0.4 mg via INTRAVENOUS

## 2020-08-28 NOTE — Progress Notes (Signed)
This nurse found patient unarousable at 5:15 am. Charge nurse notified and e-link was called into room at 5:20 am. 0.4 of narcan was given due to patient receiving 12.5 mcg of fentanyl at 00:19 am. Patient did not respond to narcan. E-link called a code stroke. Stat CT took place and while in the CT room patient started to have seizure like activity. Stroke doctor ordered 1 mg of ativan twice and IV keppra Patient moved back to ICU and continued to have seizure like activity and e-link doctor ordered another 1 mg of ativan. Patient seem to respond to treatment and has been resting since 07:00 am

## 2020-08-28 NOTE — Progress Notes (Signed)
Ring at bedside given to daughter to take home.

## 2020-08-28 NOTE — Progress Notes (Signed)
eLink Physician-Brief Progress Note Patient Name: Lexey Fletes DOB: [birth year] MRN: [id number]   Date of Service  08/28/2020  HPI/Events of Note  Oliguria. Lactic acid 2.3, normal LV function on echo.  eICU Interventions  Albumin 5 % 500 ml iv bolus x 1.        Thomasene Lot Kaelea Gathright 08/28/2020, 4:32 AM

## 2020-08-28 NOTE — Progress Notes (Addendum)
eLink Physician-Brief Progress Note Patient Name: Amber [name] DOB: Apr 09, 1956 MRN: [id number]   Date of Service  08/28/2020  HPI/Events of Note  Altered mental status did not improve with Narcan.  eICU Interventions  Code stroke called. EEG ordered, neurology consult requested.        Thomasene Lot Mischele Detter 08/28/2020, 5:57 AM

## 2020-08-28 NOTE — Progress Notes (Signed)
eLink Physician-Brief Progress Note Patient Name: Amber [name] DOB: 06/04/1956 MRN: [id number]   Date of Service  08/28/2020  HPI/Events of Note  Patient received 12.5 mcg of Fentanyl and her mental status become altered.  eICU Interventions  Narcan 0.4 mg iv x 1, iv Fentanyl discontinued.        Amber [name] 08/28/2020, 5:43 AM

## 2020-08-28 NOTE — Consult Note (Addendum)
[facility] Tele[facility]rology Consult Services   Date of Service:   08/28/2020 06:14:48  Impression:   Complex partial status epilepticus Comments/Sign-Out: the patient appears to be in complex partial status. She was given a total of 2 mg of Ativan IV which did not seem to stop arrhythmic twitching movements. she has a right gaze with rhythmic facial movements and bilateral arm movements. 1 g Keppra load ordered. Depakote level needs to be obtained. CT of the head did not show any new acute changes appreciated chronic right hemispheric infarction. the patient has a contrast allergy with active ongoing seizure activity cannot obtain CTA head and neck. Suspicion is for status epilepticus rather than stroke although exam is limited due to seizure activity. Her seizure activity continues in spite of her Ativan she will likely require additional doses and likely intubation. She will need stat EEG with ceeg  Metrics: Last Known Well: 08/28/2020 05:00:00 [facility] Notification Time: 08/28/2020 06:14:47 Stamp Time: 08/28/2020 06:14:48 Time First Login Attempt: 08/28/2020 06:18:03 Symptoms: altered mental status right gaze. NIHSS Start Assessment Time: 08/28/2020 06:22:00 Patient is not a candidate for Thrombolytic. Thrombolytic Medical Decision: 08/28/2020 06:42:53 Patient was not deemed candidate for Thrombolytic because of following reasons: Active Coagulopathy.  CT head showed no acute hemorrhage or acute core infarct.  Radiologist was not called back for review of advanced imaging.  Advanced Imaging: Advanced Imaging Not Recommended because:  the patient has a contrast allergy and is actively seizing   Sign Out:     .  Discussed with Primary Attending     .  Discussed with Rapid Response Team    ------------------------------------------------------------------------------  History of Present Illness: Patient is a 64 year old Female.  Inpatient stroke alert was called  for symptoms of altered mental status right gaze.  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 She is admitted with altered mental status and sepsis syndrome. She had CT of the abdomen did not show any acute changes. Her lites have been markedly off since admission with an INR of over 4 on arrival in spite of not being on any blood thinners. Family thought she then stopped from taking a look with but apparently historical information gathering wwas difficult. Compliance with medications not 100% known. She is on levophed for hypotension and septic shock. 5 AM she was felt to be at her baseline and then shortly thereafter noted to be drooling with a right gaze. On return to the CT scanner she was noted to have some twitching rhythmic movements.   Anticoagulant use:  No  Antiplatelet use: No  Allergies:  Reviewed    Examination: BP(131/75), Pulse(65), Blood Glucose(179) exam limited due to active seizure activity 1A: Level of Consciousness - Movements to Pain + 2 1B: Ask Month and Age - Both Questions Right + 0 1C: Blink Eyes & Squeeze Hands - Performs 0 Tasks + 2 2: Test Horizontal Extraocular Movements - Forced Gaze Palsy: Cannot Be Overcome + 2 3: Test Visual Fields - No Visual Loss + 0 4: Test Facial Palsy (Use Grimace if Obtunded) - Normal symmetry + 0 5A: Test Left Arm Motor Drift - No Effort Against Gravity + 3 5B: Test Right Arm Motor Drift - No Effort Against Gravity + 3 6A: Test Left Leg Motor Drift - No Effort Against Gravity + 3 6B: Test Right Leg Motor Drift - No Effort Against Gravity + 3 7: Test Limb Ataxia (FNF/Heel-Shin) -  No Ataxia + 0 8: Test Sensation - Normal; No sensory loss + 0 9: Test Language/Aphasia - Mute/Global Aphasia: No Usable Speech/Auditory Comprehension + 3 10: Test Dysarthria - Mute/Anarthric + 2 11: Test Extinction/Inattention - No abnormality +  0  NIHSS Score: 23  Pre-Morbid Modified Rankin Scale: 1 Points = No significant disability despite symptoms; able to carry out all usual duties and activities   Patient/Family was informed the Neurology Consult would occur via TeleHealth consult by way of interactive audio and video telecommunications and consented to receiving care in this manner.   Patient is being evaluated for possible acute neurologic impairment and high probability of imminent or life-threatening deterioration. I spent total of 35 minutes providing care to this patient, including time for face to face visit via telemedicine, review of medical records, imaging studies and discussion of findings with providers, the patient and/or family.   Dr Jossie Ng   [facility] [phone]  Case [id number]

## 2020-08-28 NOTE — Consult Note (Signed)
[facility] Consult  Referring Provider: Dr.Alva/Critical Care Medicine Primary Care Physician:  [name], MD Primary Gastroenterologist: Gentry Fitz  Reason for Consultation: Enteritis, lactic acidosis, diarrhea, recurrent pancreatitis  HPI: Amber [name] is a 64 y.o. female was admitted on 08/26/2020 with diarrhea and weakness.  Most of the history is obtained from documentation, as patient had a seizure in the morning, was given Ativan and is lethargic and nonresponsive currently. It seems she presented with diarrhea and weakness, hypotension, lactic acidosis, elevated WBC count and anemia.  Review of records from care everywhere reveals that patient had pancolitis noted on CAT in 2019, she was noted to have fluid-filled distention of small bowel without mechanical bowel obstruction with a normal-appearing distal and terminal ileum. She underwent a colonoscopy in 2019, was found to have moderate sigmoid diverticulosis with medium size internal hemorrhoids. In August 2019 she had an EGD which showed multiple duodenal ulcers and gastric antral vascular ectasia.  Although she has history of atrial fibrillation because of increased risk of bleeding, Eliquis was discontinued.  On admission she had a CAT scan of the chest, abdomen and pelvis without contrast, which showed changes of cirrhosis, no intrahepatic biliary ductal dilatation, moderate pancreatic atrophy with coarse calcifications, compatible with chronic pancreatitis, loose stool throughout colon compatible with diarrheal state, no inflammatory changes in right lower quadrant.  Repeat CAT scan on 08/27/2020 for acute generalized abdominal pain and distention, showed mildly distended and fluid-filled colon, circumferential wall thickening of the terminal ileum concerning for enteritis, mild dilation of more proximal small bowel, potentially could represent Crohn's disease.  She had a CAT scan in 9/21 which showed inflammation of  ascending, transverse, descending which could be inflammatory or infectious in origin.  Her stool studies have been negative for C. difficile and GI pathogen panel. FOBT was negative on 08/26/2020.  Past Medical History:  Diagnosis Date  . ACS (acute coronary syndrome) (HCC) 06/12/2015  . Acute on chronic blood loss anemia 11/25/2017  . Arthritis   . Asthma   . 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 CHF (congestive heart failure) (HCC)   . Chronic pain   . Constipation   . Diabetes mellitus without complication (HCC)   . Duodenitis   . Embolic stroke (HCC)   . Falls frequently   . Focal seizure (HCC)   . GERD (gastroesophageal reflux disease)   . Hypertension   . Impacted fracture 02/15/2017   right glenoid  . Pneumonia 10/2015  . Seizures (HCC)    last seizure March 2015  . Small bowel obstruction (HCC) 11/25/2017    Past Surgical History:  Procedure Laterality Date  . APPENDECTOMY    . BACK SURGERY    . CHOLECYSTECTOMY    . TEE WITHOUT CARDIOVERSION N/A 10/30/2016   Procedure: TRANSESOPHAGEAL ECHOCARDIOGRAM (TEE);  Surgeon: Lars Masson, MD;  Location: [facility] ENDOSCOPY;  Service: Cardiovascular;  Laterality: N/A;    Prior to Admission medications   Medication Sig Start Date End Date Taking? Authorizing Provider  acetaminophen (TYLENOL) 500 MG tablet Take 2 tablets (1,000 mg total) by mouth every 8 (eight) hours. Patient taking differently: Take 1,000 mg by mouth every 8 (eight) hours as needed for mild pain or moderate pain.  12/30/19  Yes Almon Hercules, MD  albuterol (PROVENTIL HFA;VENTOLIN HFA) 108 (90 Base) MCG/ACT inhaler Inhale 2 puffs into the lungs every 4 (four) hours as needed for wheezing or shortness of breath. 01/16/19  Yes Glade Lloyd, MD  allopurinol (ZYLOPRIM) 300  MG tablet Take 300 mg by mouth daily. 11/11/16  Yes [provider]  amLODipine (NORVASC) 5 MG tablet Take 10 mg by mouth daily.    Yes [provider]   apixaban (ELIQUIS) 5 MG TABS tablet Take 1 tablet (5 mg total) by mouth 2 (two) times daily. 02/14/20  Yes Eulogio Bear U, DO  atorvastatin (LIPITOR) 10 MG tablet Take 10 mg by mouth daily at 6 PM.  08/13/15  Yes [provider]  CREON 24000-76000 units CPEP Take 1 capsule by mouth 3 (three) times daily.  09/23/19  Yes [provider]  esomeprazole (NEXIUM) 40 MG capsule Take 40 mg by mouth 2 (two) times daily before a meal.  11/26/19  Yes [provider]  folic acid (FOLVITE) 1 MG tablet Take 1 mg by mouth daily. 11/20/19  Yes [provider]  linaclotide (LINZESS) 145 MCG CAPS capsule Take 1 capsule (145 mcg total) by mouth daily before breakfast. Patient taking differently: Take 145 mcg by mouth daily as needed.  03/29/20  Yes Caryl Ada K, PA-C  metoprolol succinate (TOPROL-XL) 25 MG 24 hr tablet Take 1 tablet (25 mg total) by mouth daily. 10/15/18  Yes Mikhail, [place], DO  nystatin-triamcinolone ointment (MYCOLOG) Apply 1 application topically 2 (two) times daily as needed for rash. 09/23/19  Yes [provider]  Oxycodone HCl 20 MG TABS Take 0.5 tablets (10 mg total) by mouth every 6 (six) hours as needed (severe pain). Patient taking differently: Take 20 mg by mouth every 6 (six) hours.  03/27/20  Yes Nita Sells, MD  polyethylene glycol (MIRALAX / GLYCOLAX) 17 g packet Take 17 g by mouth daily. Patient taking differently: Take 17 g by mouth daily as needed for mild constipation.  03/28/20  Yes Nita Sells, MD  predniSONE (DELTASONE) 5 MG tablet Take 1 tablet (5 mg total) by mouth daily with breakfast. 01/16/19  Yes Aline August, MD  valproic acid (DEPAKENE) 250 MG capsule Take 250 mg by mouth 3 (three) times daily.    Yes [provider]  calcium-vitamin D (OSCAL WITH D) 500-200 MG-UNIT tablet Take 2 tablets by mouth daily with breakfast. Patient not taking: Reported on 08/26/2020 12/30/19   Mercy Riding, MD  senna-docusate  (SENOKOT-S) 8.6-50 MG tablet Take 1 tablet by mouth at bedtime. Patient not taking: Reported on 08/15/2020 03/27/20   Nita Sells, MD    Current Facility-Administered Medications  Medication Dose Route Frequency Provider Last Rate Last Admin  . albuterol (VENTOLIN HFA) 108 (90 Base) MCG/ACT inhaler 2 puff  2 puff Inhalation Q4H PRN Corey Harold, NP      . Chlorhexidine Gluconate Cloth 2 % PADS 6 each  6 each Topical Daily Icard, Bradley L, DO   6 each at 08/27/20 1020  . fludrocortisone (FLORINEF) tablet 0.1 mg  0.1 mg Oral Daily Curatolo, Adam, DO   0.1 mg at 08/27/20 0942  . hydrocortisone sodium succinate (SOLU-CORTEF) 100 MG injection 50 mg  50 mg Intravenous Q6H Corey Harold, NP   50 mg at 08/28/20 0500  . insulin aspart (novoLOG) injection 0-6 Units  0-6 Units Subcutaneous Q4H Erick Colace, NP   2 Units at 08/28/20 0409  . levETIRAcetam (KEPPRA) IVPB 500 mg/100 mL premix  500 mg Intravenous Q12H Rigoberto Noel, MD      . MEDLINE mouth rinse  15 mL Mouth Rinse BID Icard, Bradley L, DO   15 mL at 08/27/20 0942  . norepinephrine (LEVOPHED) 4mg  in  241mL premix infusion  0-40 mcg/min Intravenous Titrated Icard, Bradley L, DO 15 mL/hr at 08/28/20 0848 4 mcg/min at 08/28/20 0848  . piperacillin-tazobactam (ZOSYN) IVPB 2.25 g  2.25 g Intravenous Q6H Angela Adam, RPH 100 mL/hr at 08/28/20 0908 2.25 g at 08/28/20 0908  . polyethylene glycol (MIRALAX / GLYCOLAX) packet 17 g  17 g Oral Daily PRN Corey Harold, NP      . sodium bicarbonate 100 mEq in dextrose 5 % 1,000 mL infusion   Intravenous Continuous Erick Colace, NP 100 mL/hr at 08/28/20 0849 New Bag at 08/28/20 0849  . sodium chloride 0.9 % bolus 1,000 mL  1,000 mL Intravenous Once Curatolo, Adam, DO      . sodium chloride flush (NS) 0.9 % injection 10-40 mL  10-40 mL Intracatheter Q12H Icard, Bradley L, DO      . sodium chloride flush (NS) 0.9 % injection 10-40 mL  10-40 mL Intracatheter PRN Icard, Bradley L, DO       . thiamine $RemoveBe'500mg'CeRmDfHro$  in normal saline (4ml) IVPB  500 mg Intravenous Q24H Icard, Bradley L, DO   Stopped at 08/28/20 0046  . valproate (DEPACON) 250 mg in dextrose 5 % 50 mL IVPB  250 mg Intravenous Q8H Minda Ditto, RPH   Stopped at 08/28/20 5284    Allergies as of 08/26/2020 - Review Complete 08/26/2020  Allergen Reaction Noted  . Ivp dye [iodinated diagnostic agents] Itching 05/29/2014  . Metrizamide Itching 05/29/2014    Family History  Problem Relation Age of Onset  . Kidney failure Mother   . Hypertension Sister   . Hypertension Brother     Social History   Socioeconomic History  . Marital status: Single    Spouse name: Not on file  . Number of children: Not on file  . Years of education: Not on file  . Highest education level: Not on file  Occupational History  . Not on file  Tobacco Use  . Smoking status: Former Smoker    Quit date: 2010    Years since quitting: 11.7  . Smokeless tobacco: Never Used  Vaping Use  . Vaping Use: Never used  Substance and Sexual Activity  . Alcohol use: Not Currently  . Drug use: No  . Sexual activity: Never    Birth control/protection: Post-menopausal  Other Topics Concern  . Not on file  Social History Narrative  . Not on file   Social Determinants of Health   Financial Resource Strain:   . Difficulty of Paying Living Expenses: Not on file  Food Insecurity:   . Worried About Charity fundraiser in the Last Year: Not on file  . Ran Out of Food in the Last Year: Not on file  Transportation Needs:   . Lack of Transportation (Medical): Not on file  . Lack of Transportation (Non-Medical): Not on file  Physical Activity:   . Days of Exercise per Week: Not on file  . Minutes of Exercise per Session: Not on file  Stress:   . Feeling of Stress : Not on file  Social Connections:   . Frequency of Communication with Friends and Family: Not on file  . Frequency of Social Gatherings with Friends and Family: Not on file  .  Attends Religious Services: Not on file  . Active Member of Clubs or Organizations: Not on file  . Attends Archivist Meetings: Not on file  . Marital Status: Not on file  Intimate Partner Violence:   .  Fear of Current or Ex-Partner: Not on file  . Emotionally Abused: Not on file  . Physically Abused: Not on file  . Sexually Abused: Not on file    Review of Systems: Unable to obtain  Physical Exam: Vital signs in last 24 hours: Temp:  [96.5 F (35.8 C)-98 F (36.7 C)] 96.5 F (35.8 C) (10/09 0800) Pulse Rate:  [36-115] 91 (10/09 0700) Resp:  [5-33] 30 (10/09 0700) SpO2:  [76 %-100 %] 100 % (10/09 0700) Arterial Line BP: (81-143)/(47-99) 114/69 (10/09 0700) Weight:  [55.5 kg] 55.5 kg (10/09 0414) Last BM Date: 08/27/20  General: Well-developed, overweight Head:  Normocephalic and atraumatic. Eyes: Mild icterus, mild pallor. Ears: Unable to assess Nose:  No deformity, discharge,  or lesions. Mouth: Unable to assess Neck: no masses  Lungs:  Clear throughout to auscultation.   No wheezes, crackles, or rhonchi. No acute distress. Heart:  Regular rate and rhythm; no murmurs, clicks, rubs,  or gallops. Extremities:  Without clubbing or edema. Neurologic: Lethargic, unresponsive Skin:  Intact without significant lesions or rashes. Psych: Unable to assess. Abdomen: Distended, surgical scars noted in right upper quadrant, suprapubic area and right lower quadrant, tympanic on percussion, bowel sounds not heard        Lab Results: Recent Labs    08/27/20 0234 08/27/20 1112 08/28/20 0408  WBC 8.6 7.6 9.3  HGB 5.9* 9.8* 10.1*  HCT 17.8* 28.4* 28.7*  PLT 91* 89* 86*   BMET Recent Labs    08/27/20 1103 08/27/20 1700 08/28/20 0408  NA 142 138 135  K 3.5 3.7 3.5  CL 109 109 104  CO2 15* 17* 19*  GLUCOSE 167* 211* 206*  BUN $Re'10 10 12  'sYG$ CREATININE 2.09* 2.15* 2.23*  CALCIUM 6.9* 6.5* 6.3*   LFT Recent Labs    08/28/20 0408  PROT 5.2*  ALBUMIN 2.4*  AST  51*  ALT 13  ALKPHOS 217*  BILITOT 2.9*   PT/INR Recent Labs    08/26/20 1750 08/27/20 0914  LABPROT 38.6* 39.7*  INR 4.1* 4.3*    Studies/Results: CT ABDOMEN PELVIS WO CONTRAST  Result Date: 08/27/2020 CLINICAL DATA:  Acute generalized abdominal pain and distention. EXAM: CT ABDOMEN AND PELVIS WITHOUT CONTRAST TECHNIQUE: Multidetector CT imaging of the abdomen and pelvis was performed following the standard protocol without IV contrast. COMPARISON:  August 15, 2020. FINDINGS: Lower chest: Mild bilateral pleural effusions are noted with adjacent subsegmental atelectasis. Hepatobiliary: No focal liver abnormality is seen. Status post cholecystectomy. No biliary dilatation. Pancreas: Calcifications are noted within the pancreas consistent with chronic pancreatitis. No acute abnormality is noted. Spleen: Normal in size without focal abnormality. Adrenals/Urinary Tract: Adrenal glands and kidneys are unremarkable. No hydronephrosis or renal obstruction is noted. No renal or ureteral calculi are noted. Urinary bladder is decompressed secondary to Foley catheter. Stomach/Bowel: The stomach appears normal. The colon is mildly distended and fluid-filled. Sigmoid diverticulosis is noted without inflammation. The terminal ileum demonstrates circumferential wall thickening concerning for enteritis or other inflammation. There is mild dilatation of more proximal small bowel with fecalization concerning for some degree of obstruction. Potentially these findings may represent Crohn's disease. Vascular/Lymphatic: Aortic atherosclerosis. No enlarged abdominal or pelvic lymph nodes. Reproductive: Uterus and bilateral adnexa are unremarkable. Other: Moderate anasarca is noted. Minimal ascites is noted in the pelvis. Musculoskeletal: No acute or significant osseous findings. IMPRESSION: 1. Mild bilateral pleural effusions are noted with adjacent subsegmental atelectasis. 2. Sigmoid diverticulosis without  inflammation. 3. Moderate anasarca is noted. 4. Calcifications are  noted within the pancreas consistent with chronic pancreatitis. 5. The colon is mildly distended and fluid-filled. The terminal ileum demonstrates circumferential wall thickening concerning for enteritis or other inflammation. There is mild dilatation of more proximal small bowel with fecalization concerning for some degree of obstruction. Potentially these findings may represent Crohn's disease. 6. Minimal ascites is noted in the pelvis. 7. Aortic atherosclerosis. Aortic Atherosclerosis (ICD10-I70.0). Electronically Signed   By: Marijo Conception M.D.   On: 08/27/2020 14:46   CT ABDOMEN PELVIS WO CONTRAST  Result Date: 08/26/2020 CLINICAL DATA:  64 year old female with chest pain and shortness of breath. EXAM: CT CHEST, ABDOMEN AND PELVIS WITHOUT CONTRAST TECHNIQUE: Multidetector CT imaging of the chest, abdomen and pelvis was performed following the standard protocol without IV contrast. COMPARISON:  Chest radiograph dated 08/26/2020 and CT of the abdomen pelvis dated 08/15/2020. CT dated 03/24/2020. FINDINGS: CT CHEST FINDINGS Evaluation of this exam is limited in the absence of intravenous contrast as well as due to streak artifact caused by spinal hardware. Cardiovascular: Top-normal cardiac size. No pericardial effusion. There is atherosclerotic calcification of the thoracic aorta. No aneurysmal dilatation. The central pulmonary arteries are grossly unremarkable. Mediastinum/Nodes: No hilar or mediastinal adenopathy. There is a small hiatal hernia. The esophagus is grossly unremarkable. No mediastinal fluid collection. Lungs/Pleura: Small bilateral pleural effusions. There is diffuse interstitial streaky and bandlike densities with peribronchial thickening, new since the CT of 03/24/2020 and may represent edema, or pneumonia or combination. Clinical correlation is recommended. There is no pneumothorax. The central airways are patent.  Musculoskeletal: Degenerative changes of the spine and several bilateral old healed rib fractures. Extensive posterior spinal fusion and prior T4-T5 corpectomy. No acute osseous pathology. CT ABDOMEN PELVIS FINDINGS No intra-abdominal free air or free fluid. Hepatobiliary: Cirrhosis. No intrahepatic biliary ductal dilatation. Cholecystectomy. Pancreas: Moderate pancreatic atrophy with scattered coarse calcification sequela of chronic pancreatitis. No active inflammatory changes. Spleen: Normal in size without focal abnormality. Adrenals/Urinary Tract: The adrenal glands unremarkable. The kidneys, visualized ureters, and urinary bladder appear unremarkable. Stomach/Bowel: There is a small hiatal hernia. There is sigmoid diverticulosis without active inflammatory changes. Loose stool noted throughout the colon compatible with diarrheal state. Correlation with clinical exam and stool cultures recommended. There is no bowel obstruction. The appendix is not visualized with certainty. No inflammatory changes identified in the right lower quadrant. Vascular/Lymphatic: Advanced aortoiliac atherosclerotic disease. No portal venous gas. There is no adenopathy. Reproductive: The uterus is grossly unremarkable. No adnexal masses. Other: Diffuse subcutaneous soft tissue edema of the pelvis and buttock. No fluid collection. Musculoskeletal: Osteopenia with degenerative changes of the spine. No acute osseous pathology. 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 Old compression fracture of the L1 and L2 and L3. IMPRESSION: 1. Small bilateral pleural effusions with diffuse interstitial streaky and bandlike densities, new since the CT of 03/24/2020 and may represent edema, or pneumonia or combination. Clinical correlation is recommended. 2. Diarrheal state. Correlation with clinical exam and stool cultures recommended. No bowel obstruction. 3. Sigmoid diverticulosis. 4. Cirrhosis. 5. Aortic  Atherosclerosis (ICD10-I70.0). Electronically Signed   By: Anner Crete M.D.   On: 08/26/2020 19:36   CT Head Wo Contrast  Result Date: 08/26/2020 CLINICAL DATA:  Altered mental status. EXAM: CT HEAD WITHOUT CONTRAST TECHNIQUE: Contiguous axial images were obtained from the base of the skull through the vertex without intravenous contrast. COMPARISON:  02/12/2020 FINDINGS: Brain: Chronic atrophic changes and chronic white matter ischemic changes are seen similar to that noted on  the prior exam. Encephalomalacia changes are noted primarily on the right related to areas of prior infarct in the distribution of the right middle cerebral artery. No findings to suggest acute hemorrhage, acute infarction or space-occupying mass lesion are noted. Vascular: No hyperdense vessel or unexpected calcification. Skull: Normal. Negative for fracture or focal lesion. Sinuses/Orbits: No acute finding. Other: None. IMPRESSION: Chronic atrophic and ischemic changes. Encephalomalacia changes consistent with the known history of prior right MCA infarct. No acute abnormality is noted. Electronically Signed   By: Inez Catalina M.D.   On: 08/26/2020 19:30   CT Chest Wo Contrast  Result Date: 08/26/2020 CLINICAL DATA:  64 year old female with chest pain and shortness of breath. EXAM: CT CHEST, ABDOMEN AND PELVIS WITHOUT CONTRAST TECHNIQUE: Multidetector CT imaging of the chest, abdomen and pelvis was performed following the standard protocol without IV contrast. COMPARISON:  Chest radiograph dated 08/26/2020 and CT of the abdomen pelvis dated 08/15/2020. CT dated 03/24/2020. FINDINGS: CT CHEST FINDINGS Evaluation of this exam is limited in the absence of intravenous contrast as well as due to streak artifact caused by spinal hardware. Cardiovascular: Top-normal cardiac size. No pericardial effusion. There is atherosclerotic calcification of the thoracic aorta. No aneurysmal dilatation. The central pulmonary arteries are grossly  unremarkable. Mediastinum/Nodes: No hilar or mediastinal adenopathy. There is a small hiatal hernia. The esophagus is grossly unremarkable. No mediastinal fluid collection. Lungs/Pleura: Small bilateral pleural effusions. There is diffuse interstitial streaky and bandlike densities with peribronchial thickening, new since the CT of 03/24/2020 and may represent edema, or pneumonia or combination. Clinical correlation is recommended. There is no pneumothorax. The central airways are patent. Musculoskeletal: Degenerative changes of the spine and several bilateral old healed rib fractures. Extensive posterior spinal fusion and prior T4-T5 corpectomy. No acute osseous pathology. CT ABDOMEN PELVIS FINDINGS No intra-abdominal free air or free fluid. Hepatobiliary: Cirrhosis. No intrahepatic biliary ductal dilatation. Cholecystectomy. Pancreas: Moderate pancreatic atrophy with scattered coarse calcification sequela of chronic pancreatitis. No active inflammatory changes. Spleen: Normal in size without focal abnormality. Adrenals/Urinary Tract: The adrenal glands unremarkable. The kidneys, visualized ureters, and urinary bladder appear unremarkable. Stomach/Bowel: There is a small hiatal hernia. There is sigmoid diverticulosis without active inflammatory changes. Loose stool noted throughout the colon compatible with diarrheal state. Correlation with clinical exam and stool cultures recommended. There is no bowel obstruction. The appendix is not visualized with certainty. No inflammatory changes identified in the right lower quadrant. Vascular/Lymphatic: Advanced aortoiliac atherosclerotic disease. No portal venous gas. There is no adenopathy. Reproductive: The uterus is grossly unremarkable. No adnexal masses. Other: Diffuse subcutaneous soft tissue edema of the pelvis and buttock. No fluid collection. Musculoskeletal: Osteopenia with degenerative changes of the spine. No acute osseous pathology. Old right pubic bone  fracture and bilateral femoral head avascular necrosis. Old compression fracture of the L1 and L2 and L3. IMPRESSION: 1. Small bilateral pleural effusions with diffuse interstitial streaky and bandlike densities, new since the CT of 03/24/2020 and may represent edema, or pneumonia or combination. Clinical correlation is recommended. 2. Diarrheal state. Correlation with clinical exam and stool cultures recommended. No bowel obstruction. 3. Sigmoid diverticulosis. 4. Cirrhosis. 5. Aortic Atherosclerosis (ICD10-I70.0). Electronically Signed   By: Anner Crete M.D.   On: 08/26/2020 19:36   US RENAL  Result Date: 08/27/2020 CLINICAL DATA:  Renal failure. EXAM: RENAL / URINARY TRACT ULTRASOUND COMPLETE COMPARISON:  None. FINDINGS: Right Kidney: Renal measurements: 9.0 x 4.0 x 3.5 cm = volume: 68 mL. Echogenicity within normal limits. No mass or  hydronephrosis visualized. Left Kidney: Renal measurements: 9.4 x 4.3 x 3.7 cm = volume: 80 mL. Echogenicity within normal limits. No mass or hydronephrosis visualized. Bladder: Not visualized as it appears to be completely decompressed. Other: None. IMPRESSION: Normal renal ultrasound. Electronically Signed   By: Marijo Conception M.D.   On: 08/27/2020 11:33   DG Chest Port 1 View  Result Date: 08/26/2020 CLINICAL DATA:  Sepsis EXAM: PORTABLE CHEST 1 VIEW COMPARISON:  08/15/2020 FINDINGS: Lungs are clear. No pneumothorax or pleural effusion. Cardiac size is within normal limits. Cervicothoracic fusion with instrumentation is again visualized, not well assessed on this exam. Multiple remote right rib fractures are again identified. Degenerative changes are noted within the shoulders bilaterally IMPRESSION: No active disease.  No interval change. Electronically Signed   By: Fidela Salisbury MD   On: 08/26/2020 19:01   ECHOCARDIOGRAM COMPLETE  Result Date: 08/27/2020    ECHOCARDIOGRAM REPORT   Patient Name:   Amber [name] Date of Exam: 08/27/2020 Medical Rec #:   [id number]      Height:       60.0 in Accession #:    [id number]     Weight:       122.4 lb Date of Birth:  1956-10-23      BSA:          1.515 m Patient Age:    27 years       BP:           78/45 mmHg Patient Gender: F              HR:           102 bpm. Exam Location:  Inpatient Procedure: 2D Echo, Cardiac Doppler and Color Doppler Indications:    Shock ([place])  History:        Patient has prior history of Echocardiogram examinations, most                 recent 12/25/2019. CHF; Risk Factors:Hypertension and Diabetes.  Sonographer:    Bernadene Person RDCS Referring Phys: [id number] [place]  1. Left ventricular ejection fraction, by estimation, is 60 to 65%. The left ventricle has normal function. The left ventricle has no regional wall motion abnormalities. Left ventricular diastolic parameters are indeterminate.  2. Right ventricular systolic function is normal. The right ventricular size is normal.  3. The mitral valve is normal in structure. No evidence of mitral valve regurgitation. No evidence of mitral stenosis.  4. Tricuspid valve regurgitation is moderate.  5. The aortic valve is normal in structure. Aortic valve regurgitation is not visualized. No aortic stenosis is present. FINDINGS  Left Ventricle: Left ventricular ejection fraction, by estimation, is 60 to 65%. The left ventricle has normal function. The left ventricle has no regional wall motion abnormalities. The left ventricular internal cavity size was normal in size. There is  no left ventricular hypertrophy. Left ventricular diastolic parameters are indeterminate. Right Ventricle: The right ventricular size is normal. No increase in right ventricular wall thickness. Right ventricular systolic function is normal. Left Atrium: Left atrial size was normal in size. Right Atrium: Right atrial size was normal in size. Pericardium: There is no evidence of pericardial effusion. Mitral Valve: The mitral valve is normal in structure. No evidence  of mitral valve regurgitation. No evidence of mitral valve stenosis. Tricuspid Valve: The tricuspid valve is grossly normal. Tricuspid valve regurgitation is moderate . No evidence of tricuspid stenosis. Aortic Valve: The aortic valve is normal  in structure. Aortic valve regurgitation is not visualized. No aortic stenosis is present. Pulmonic Valve: The pulmonic valve was normal in structure. Pulmonic valve regurgitation is not visualized. No evidence of pulmonic stenosis. Aorta: The aortic root and ascending aorta are structurally normal, with no evidence of dilitation. IAS/Shunts: The atrial septum is grossly normal.  LEFT VENTRICLE PLAX 2D LVIDd:         4.20 cm  Diastology LVIDs:         2.50 cm  LV e' medial:    5.70 cm/s LV PW:         0.80 cm  LV E/e' medial:  12.3 LV IVS:        0.80 cm  LV e' lateral:   10.60 cm/s LVOT diam:     1.70 cm  LV E/e' lateral: 6.6 LV SV:         48 LV SV Index:   32 LVOT Area:     2.27 cm  RIGHT VENTRICLE RV S prime:     12.90 cm/s TAPSE (M-mode): 1.2 cm LEFT ATRIUM           Index       RIGHT ATRIUM           Index LA diam:      3.20 cm 2.11 cm/m  RA Area:     15.10 cm LA Vol (A2C): 24.2 ml 15.98 ml/m RA Volume:   35.60 ml  23.50 ml/m LA Vol (A4C): 31.0 ml 20.46 ml/m  AORTIC VALVE LVOT Vmax:   103.00 cm/s LVOT Vmean:  63.700 cm/s LVOT VTI:    0.212 m  AORTA Ao Root diam: 2.60 cm MITRAL VALVE               TRICUSPID VALVE MV Area (PHT): 3.03 cm    TR Peak grad:   28.7 mmHg MV Decel Time: 250 msec    TR Vmax:        268.00 cm/s MV E velocity: 70.20 cm/s MV A velocity: 75.20 cm/s  SHUNTS MV E/A ratio:  0.93        Systemic VTI:  0.21 m                            Systemic Diam: 1.70 cm Mertie Moores MD Electronically signed by Mertie Moores MD Signature Date/Time: 08/27/2020/2:20:59 PM    Final    CT HEAD CODE STROKE WO CONTRAST  Result Date: 08/28/2020 CLINICAL DATA:  Code stroke. Initial evaluation for acute neuro deficit, possible seizure activity. EXAM: CT HEAD WITHOUT  CONTRAST TECHNIQUE: Contiguous axial images were obtained from the base of the skull through the vertex without intravenous contrast. COMPARISON:  Prior CT from 08/26/2020. FINDINGS: Brain: Advanced age-related cerebral atrophy. 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 Remote lacunar infarcts noted involving the bilateral thalami and left basal ganglia. Appearance is relatively stable. No acute intracranial hemorrhage. No acute large vessel territory infarct. No mass lesion or midline shift. No hydrocephalus or extra-axial fluid collection. Vascular: No hyperdense vessel. Calcified atherosclerosis present at skull base. Skull: Scalp soft tissues and calvarium within normal limits. Sinuses/Orbits: Globes and orbital soft tissues demonstrate no acute finding. Paranasal sinuses are clear. No mastoid effusion. Other: None. ASPECTS [facility] Stroke Program Early CT Score) - Ganglionic level infarction (caudate, lentiform nuclei, internal capsule, insula, M1-M3 cortex): 7 - Supraganglionic infarction (M4-M6 cortex): 3 Total score (0-10  with 10 being normal): 10 IMPRESSION: 1. Stable head CT.  No acute intracranial abnormality identified 2. ASPECTS is 10. 3. Advanced cerebral atrophy with chronic microvascular ischemic disease, with multiple remote ischemic infarcts as above. Critical Value/emergent results were called by telephone at the time of interpretation on 08/28/2020 at 6:44 am to provider Trevor Mace , who verbally acknowledged these results. Electronically Signed   By: Jeannine Boga M.D.   On: 08/28/2020 06:47   [place] EKG SITE RITE  Result Date: 08/27/2020 If Site Rite image not attached, placement could not be confirmed due to current cardiac rhythm.   Impression: Chronic diarrhea Prior CAT scan compatible with inflammation of the colon, while recent CAT scan compatible with inflammation of the terminal ileum  raising question of Crohn's disease  Currently hypotensive, hypothermic Had seizure today morning Remains on IV pressors and warming blanket, IV fluids and IV antibiotics  Acidosis, bicarb 19, impaired renal function, creatinine 2.23, GFR 23  Hemoglobin dropped from 8.1-5.8 yesterday but has increased to 9.8/10.1 after 2 units of PRBC transfusion  Multiple comorbidities: History of CAD, CVA, diabetes and seizures  Cirrhosis-thrombocytopenic, platelet 86, PT/INR of 39.7/4.3, total bili 2.9 MELDna is 34 Chronic pancreatitis  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  She would also benefit from a colonoscopy, however with ongoing hypotension requiring IV pressors and recent seizure, this will likely have to be performed on a more elective basis.  I would recommend empirically starting the patient on IV Solu-Medrol 40 mg daily.  I plan on sending ESR, CRP, fecal calprotectin today.  To be noted she has cirrhosis with a high meld score and worsening coagulopathy is an ominous sign, for possible subacute/acute liver failure, I am unable to assess mental function as she received Ativan for seizures.  I will give patient vitamin K 10 mg IV 1 dose meanwhile. Guarded prognosis.   LOS: 2 days   Ronnette Juniper, MD  08/28/2020, 9:55 AM

## 2020-08-28 NOTE — Progress Notes (Signed)
NAME:  Amber [name], MRN:  [id number], DOB:  [birth year], LOS: 2 ADMISSION DATE:  08/26/2020, CONSULTATION DATE: 08/26/2020 REFERRING MD: Dr. Lockie Mola, CHIEF COMPLAINT: Hypertension  Brief History    64 year old diabetic admitted with hypotension, lactic acidosis, diarrhea and weakness. CT abdomen/pelvis showed stigmata of recurrent pancreatitis, no other acute findings.  Overnight required pressors. Initial lipase was negative C. difficile negative Hemoglobin dropped from 8.0 on admission to 5.9, given 2 units PRBC.  recent admission was for inflammatory/infectious colitis and suspected to have UTI although urine culture negative .  She was treated with antibiotics from 9/26-10/1 but did not complete another 5 days of Cipro and Flagyl prescribed at home  PMH - CAD, CVA, type 2 diabetes, small bowel obstruction, hypertension, gastroesophageal reflux, seizure.     Past Medical History   Past Medical History:  Diagnosis Date  . ACS (acute coronary syndrome) (HCC) 06/12/2015  . Acute on chronic blood loss anemia 11/25/2017  . Arthritis   . Asthma   . 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 CHF (congestive heart failure) (HCC)   . Chronic pain   . Constipation   . Diabetes mellitus without complication (HCC)   . Duodenitis   . Embolic stroke (HCC)   . Falls frequently   . Focal seizure (HCC)   . GERD (gastroesophageal reflux disease)   . Hypertension   . Impacted fracture 02/15/2017   right glenoid  . Pneumonia 10/2015  . Seizures (HCC)    last seizure March 2015  . Small bowel obstruction (HCC) 11/25/2017     Significant Hospital Events   ICU admission 10/7   Presented to the ER with diarrhea and weakness. Hypotensive in spite of IVFs so PCCM consulted. CT abd w/out clear inflammatory changes. Elevated lactate. Pressors required.  10/8: vanc added. On 2 pressors. Repeat hgb down to 5.9 no clear evidence of bleeding. 2 units of blood ordered. abd more  distended. Repeat CT abd/pelvis ordered. Repeat lipase ordered for "stigmata of recurrent pancreatitis" this was negative.  Consults:  Pulmonary critical care  Procedures:    Significant Diagnostic Tests:  CT head: Chronic atrophic changes, encephalomalacia of the right history with prior MCA. CT chest, abdomen and pelvis 10/7:Small bilateral pleural effusion, few bandlike densities, sigmoid diverticulosis loose stool within the colon no obvious inflammatory changes. lipae 10/7: neg (22) repeat lipase 10/8: negative (21)  Repeat CT abd/pelvis 10/8>>> Sigmoid diverticulosis without inflammation.  Calcifications are noted within the pancreas consistent with chronic pancreatitis. The colon is mildly distended and fluid-filled. The terminal ileum demonstrates circumferential wall thickening concerning for enteritis or other inflammation. There is mild dilatation of more proximal small bowel with fecalization concerning for some degree of obstruction. Potentially these findings may represent Crohn's Disease.  10/9 head CT >> Multifocal ischemic infarcts involving the right frontal, parietal, and occipital regions, left frontal lobe, and cerebellum. Remote lacunar infarcts noted involving the bilateral thalami and left basal ganglia. Appearance is relatively stable.  10/8 renal US >> nml  Micro Data:  C. difficile 10/7: neg GI panel panel 10/7>>>neg COVID-19 negative BC 10/7 ng  Antimicrobials:  Piperacillin tazobactam 10/7>>>  Interim history/subjective:   Received 12.5 mg of fentanyl around midnight, unresponsive around 4 AM Code stroke called, head CT negative. Witnessed seizure by RN and tele neurologist Given Ativan 3 mg in divided doses and loaded with Keppra  Diarrhea continues  Objective   Blood pressure (!) 78/45, pulse 86, temperature 97.7 F (36.5 C), temperature source  Axillary, resp. rate (!) 23, weight 55.5 kg, SpO2 99 %.        Intake/Output Summary (Last  24 hours) at 08/28/2020 0742 Last data filed at 08/28/2020 0600 Gross per 24 hour  Intake 3424.85 ml  Output 200 ml  Net 3224.85 ml   Filed Weights   08/27/20 0316 08/28/20 0414  Weight: 55.5 kg 55.5 kg    Examination:  Gen:   Appears older than stated age, mild distress  HEENT:  EOMI, sclera anicteric, mild pallor Neck:     No JVD; no thyromegaly Lungs:    Bilateral breath sounds present, scattered rhonchi CV:         Regular rate and rhythm; no murmurs Abd:      + bowel sounds; soft, non-tender; no palpable masses, no distension Ext:    No edema; adequate peripheral perfusion Skin:      Warm and dry; no rash Neuro: Unresponsive after Ativan given, some foaming at the mouth, purposeful to painful stimulus, tries to localize , moving all 4 extremities, plantars bilateral upgoing  Labs show mild hypokalemia, hypocalcemia, no leukocytosis, improved hemoglobin posttransfusion, stable thrombocytopenia, stable creatinine  Resolved Hospital Problem list     Assessment & Plan:   Complex partial seizure, known seizure disorder Acute metabolic encephalopathy superimposed on prior CVA likely contributing to expressive aphasia  Head CT negative She was unwell prior to this admission -prior notes 2019 show that she has been on Keppra and Vimpat -Ativan 3 mg divided doses given, loaded with Keppra -Neurology involved, transferred to 4 N. for LTM EEG -Able to maintain airway at this point but continue to monitor   Septic shock w/ lactic acidosis. Source is not clear.  Urinary sepsis vs abd sepsis Repeat CT abdomen still points to enteritis -History of VRE UTI in 12/2018 , hence on isolation -History of C. difficile in the remote past recent admission was for inflammatory/infectious colitis and suspected to have UTI although urine culture negative .  She was treated with antibiotics from 9/26-10/1 but did not complete another 5 days of Cipro and Flagyl prescribed at home  Plan Continue  Zosyn for 5 to 7 days Vancomycin discontinued since MRSA negative Titrate norepi for MAP > 65 Cont stress dose steroids until off pressors  Mixed metabolic acidosis. Appears to be both anion and non-anion gap related. 2/2 lactic acidosis c/b NaCl resuscitation and bicarb loss from diarrhea -Lactic acidosis has resolved Plan Continue bicarbonate drip , ongoing losses due to diarrhea Hypocalcemia and hypomagnesemia being repleted  AKI secondary to above->has improved some w/ resuscitation efforts Plan:  Avoid nephrotoxins Serial chemistries Keep foley in place   Chronic diarrhea/CT suggests chronic pancreatitis/recurrent enteritis on CT -Prior EGD 2019 as shown duodenal ulcers -Prior colonoscopy 2019 showed sigmoid diverticulosis  -Will involve GI to formally consult  Coagulopathy ->was on apixaban for afib ? Got vitamin K 10/7 Plan Holding apixiban    Thrombocytopenia likely 2/2 sepsis Plan Serial cbcs   Anemia of unclear etiology->looks like baseline around 7.4 to 8 -down to 5.9-->2U PRBC --> 9.8 No evidence of hemolysis, attributed to chronic disease Plan  Follow  Hyperglycemia  Plan ssi     Best practice:  Diet: Clears Pain/Anxiety/Delirium protocol (if indicated): Not applicable VAP protocol (if indicated): Not applicable DVT prophylaxis: Holding due to elevated INR GI prophylaxis: Not applicable Glucose control: CBGs Mobility: Bedrest Code Status: Full Family Communication: Son updated this morning Disposition: ICU >> transferred to [facility] neurology ICU  Critical care  time x 35 minutes  Cyril Mourning MD. FCCP. [facility] See Amion for pager  If no response to pager , please call [phone]  After 7:00 pm call Elink  [phone]   08/28/2020

## 2020-08-29 ENCOUNTER — Inpatient Hospital Stay (HOSPITAL_COMMUNITY): Payer: Medicaid Other

## 2020-08-29 DIAGNOSIS — A419 Sepsis, unspecified organism: Secondary | ICD-10-CM | POA: Diagnosis not present

## 2020-08-29 DIAGNOSIS — R6520 Severe sepsis without septic shock: Secondary | ICD-10-CM | POA: Diagnosis not present

## 2020-08-29 DIAGNOSIS — J9601 Acute respiratory failure with hypoxia: Secondary | ICD-10-CM | POA: Diagnosis not present

## 2020-08-29 DIAGNOSIS — R4182 Altered mental status, unspecified: Secondary | ICD-10-CM

## 2020-08-29 DIAGNOSIS — R569 Unspecified convulsions: Secondary | ICD-10-CM | POA: Diagnosis not present

## 2020-08-29 DIAGNOSIS — R6521 Severe sepsis with septic shock: Secondary | ICD-10-CM | POA: Diagnosis not present

## 2020-08-29 DIAGNOSIS — N179 Acute kidney failure, unspecified: Secondary | ICD-10-CM | POA: Diagnosis not present

## 2020-08-29 LAB — POCT I-STAT 7, (LYTES, BLD GAS, ICA,H+H)
Acid-Base Excess: 4 mmol/L — ABNORMAL HIGH (ref 0.0–2.0)
Bicarbonate: 27.8 mmol/L (ref 20.0–28.0)
Calcium, Ion: 0.95 mmol/L — ABNORMAL LOW (ref 1.15–1.40)
HCT: 30 % — ABNORMAL LOW (ref 36.0–46.0)
Hemoglobin: 10.2 g/dL — ABNORMAL LOW (ref 12.0–15.0)
O2 Saturation: 100 %
Potassium: 3 mmol/L — ABNORMAL LOW (ref 3.5–5.1)
Sodium: 139 mmol/L (ref 135–145)
TCO2: 29 mmol/L (ref 22–32)
pCO2 arterial: 37.7 mmHg (ref 32.0–48.0)
pH, Arterial: 7.476 — ABNORMAL HIGH (ref 7.350–7.450)
pO2, Arterial: 513 mmHg — ABNORMAL HIGH (ref 83.0–108.0)

## 2020-08-29 LAB — CBC
HCT: 27.6 % — ABNORMAL LOW (ref 36.0–46.0)
Hemoglobin: 9.8 g/dL — ABNORMAL LOW (ref 12.0–15.0)
MCH: 27.3 pg (ref 26.0–34.0)
MCHC: 35.5 g/dL (ref 30.0–36.0)
MCV: 76.9 fL — ABNORMAL LOW (ref 80.0–100.0)
Platelets: 59 10*3/uL — ABNORMAL LOW (ref 150–400)
RBC: 3.59 MIL/uL — ABNORMAL LOW (ref 3.87–5.11)
RDW: 17.6 % — ABNORMAL HIGH (ref 11.5–15.5)
WBC: 7.1 10*3/uL (ref 4.0–10.5)
nRBC: 0 % (ref 0.0–0.2)

## 2020-08-29 LAB — GLUCOSE, CAPILLARY
Glucose-Capillary: 120 mg/dL — ABNORMAL HIGH (ref 70–99)
Glucose-Capillary: 126 mg/dL — ABNORMAL HIGH (ref 70–99)
Glucose-Capillary: 129 mg/dL — ABNORMAL HIGH (ref 70–99)
Glucose-Capillary: 112 mg/dL — ABNORMAL HIGH (ref 70–99)

## 2020-08-29 LAB — VALPROIC ACID LEVEL: Valproic Acid Lvl: 47 ug/mL — ABNORMAL LOW (ref 50.0–100.0)

## 2020-08-29 LAB — BASIC METABOLIC PANEL
Anion gap: 16 — ABNORMAL HIGH (ref 5–15)
BUN: 15 mg/dL (ref 8–23)
CO2: 24 mmol/L (ref 22–32)
Calcium: 7.2 mg/dL — ABNORMAL LOW (ref 8.9–10.3)
Chloride: 101 mmol/L (ref 98–111)
Creatinine, Ser: 2.45 mg/dL — ABNORMAL HIGH (ref 0.44–1.00)
GFR, Estimated: 20 mL/min — ABNORMAL LOW (ref >60)
Glucose, Bld: 123 mg/dL — ABNORMAL HIGH (ref 70–99)
Potassium: 3.1 mmol/L — ABNORMAL LOW (ref 3.5–5.1)
Sodium: 141 mmol/L (ref 135–145)

## 2020-08-29 LAB — TRIGLYCERIDES: Triglycerides: 138 mg/dL (ref <150)

## 2020-08-29 LAB — MAGNESIUM: Magnesium: 2 mg/dL (ref 1.7–2.4)

## 2020-08-29 LAB — PHOSPHORUS: Phosphorus: 4.7 mg/dL — ABNORMAL HIGH (ref 2.5–4.6)

## 2020-08-29 MED ORDER — ROCURONIUM BROMIDE 50 MG/5ML IV SOLN
70.0000 mg | Freq: Once | INTRAVENOUS | Status: AC
  Administered 2020-08-29: 70 mg via INTRAVENOUS

## 2020-08-29 MED ORDER — CHLORHEXIDINE GLUCONATE 0.12% ORAL RINSE (MEDLINE KIT)
15.0000 mL | Freq: Two times a day (BID) | OROMUCOSAL | Status: DC
  Administered 2020-08-29 – 2020-09-04 (×12): 15 mL via OROMUCOSAL

## 2020-08-29 MED ORDER — NYSTATIN 100000 UNIT/ML MT SUSP
5.0000 mL | Freq: Four times a day (QID) | OROMUCOSAL | Status: DC
  Administered 2020-08-29 (×3): 500000 [IU] via ORAL
  Filled 2020-08-29 (×4): qty 5

## 2020-08-29 MED ORDER — VALPROATE SODIUM 500 MG/5ML IV SOLN: 500.0000 mg | Freq: Three times a day (TID) | INTRAVENOUS | Status: DC

## 2020-08-29 MED ORDER — ETOMIDATE 2 MG/ML IV SOLN
20.0000 mg | Freq: Once | INTRAVENOUS | Status: AC
  Administered 2020-08-29: 20 mg via INTRAVENOUS

## 2020-08-29 MED ORDER — LEVETIRACETAM IN NACL 1500 MG/100ML IV SOLN
1500.0000 mg | Freq: Two times a day (BID) | INTRAVENOUS | Status: DC
  Administered 2020-08-29 – 2020-08-30 (×2): 1500 mg via INTRAVENOUS
  Filled 2020-08-29 (×2): qty 100

## 2020-08-29 MED ORDER — ORAL CARE MOUTH RINSE
15.0000 mL | OROMUCOSAL | Status: DC
  Administered 2020-08-29 – 2020-09-04 (×60): 15 mL via OROMUCOSAL

## 2020-08-29 MED ORDER — VALPROATE SODIUM 500 MG/5ML IV SOLN
500.0000 mg | Freq: Two times a day (BID) | INTRAVENOUS | Status: DC
  Administered 2020-08-29: 500 mg via INTRAVENOUS
  Filled 2020-08-29 (×3): qty 5

## 2020-08-29 MED ORDER — SODIUM CHLORIDE 0.9 % IV SOLN
150.0000 mg | Freq: Two times a day (BID) | INTRAVENOUS | Status: DC
  Filled 2020-08-29: qty 15

## 2020-08-29 MED ORDER — POTASSIUM CHLORIDE 10 MEQ/50ML IV SOLN
10.0000 meq | INTRAVENOUS | Status: AC
  Administered 2020-08-29 (×3): 10 meq via INTRAVENOUS
  Filled 2020-08-29 (×3): qty 50

## 2020-08-29 MED ORDER — SODIUM CHLORIDE 0.9 % IV SOLN
200.0000 mg | Freq: Once | INTRAVENOUS | Status: DC
  Filled 2020-08-29: qty 20

## 2020-08-29 MED ORDER — PROPOFOL 1000 MG/100ML IV EMUL
5.0000 ug/kg/min | INTRAVENOUS | Status: DC
  Administered 2020-08-29 – 2020-08-30 (×2): 20 ug/kg/min via INTRAVENOUS
  Filled 2020-08-29: qty 100

## 2020-08-29 MED ORDER — FUROSEMIDE 10 MG/ML IJ SOLN
20.0000 mg | Freq: Once | INTRAMUSCULAR | Status: AC
  Administered 2020-08-29: 20 mg via INTRAVENOUS
  Filled 2020-08-29: qty 2

## 2020-08-29 MED ORDER — PHENYLEPHRINE 40 MCG/ML (10ML) SYRINGE FOR IV PUSH (FOR BLOOD PRESSURE SUPPORT)
PREFILLED_SYRINGE | INTRAVENOUS | Status: AC
  Filled 2020-08-29: qty 10

## 2020-08-29 MED ORDER — GERHARDT'S BUTT CREAM
TOPICAL_CREAM | Freq: Two times a day (BID) | CUTANEOUS | Status: DC
  Administered 2020-09-03 – 2020-09-06 (×3): 1 via TOPICAL
  Filled 2020-08-29 (×2): qty 1

## 2020-08-29 NOTE — Progress Notes (Signed)
Amber [name], MRN:  [id number], DOB:  [birth year], LOS: 3 ADMISSION DATE:  08/26/2020, CONSULTATION DATE: 08/26/2020 REFERRING MD: Dr. Lockie Mola, CHIEF COMPLAINT: Hypertension  Brief History   [name] is received in transfer from [facility] in anticipation of continuous EEG monitoring. Briefly she was admitted there on 10/7 complaining of diarrhea and weakness.  She was found to have an elevated lactate and elevated white count.  Stool studies were remarkably unremarkable including a stool guaiac which was negative.  CT scan of the abdomen was remarkable for pancreatic calcifications and thickening of the bowel wall most conspicuously in the ileum.  Blood and urine cultures obtained on admission were negative.  She was transiently supported with alpha agents and placed on stress dose steroids as she is chronically on no home steroids for asthma.  Her lactic acidosis has cleared and she is now off of pressors however on 10/9 she developed drooling and a right eye gaze and was felt to be having partial complex seizures.  Mental  status has not recovered.  CT scan of the head showed an old infarct with no acute changes. There was concern that she was suffering from some clinical status and she was transferred to this hospital for continuous EEG monitoring.  On arrival here she was not protecting her airway and was drooling and she was semiemergently intubated for airway protection.  No history could be obtained from the patient as she was unresponsive at the time of my initial exam.    PMH - CAD, CVA, type 2 diabetes, small bowel obstruction, hypertension, gastroesophageal reflux, seizure.     Past Medical History   Past Medical History:  Diagnosis Date  . ACS (acute coronary syndrome) (HCC) 06/12/2015  . Acute on chronic blood loss anemia 11/25/2017  . Arthritis   . Asthma   . 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 CHF (congestive heart failure) (HCC)    . Chronic pain   . Constipation   . Diabetes mellitus without complication (HCC)   . Duodenitis   . Embolic stroke (HCC)   . Falls frequently   . Focal seizure (HCC)   . GERD (gastroesophageal reflux disease)   . Hypertension   . Impacted fracture 02/15/2017   right glenoid  . Pneumonia 10/2015  . Seizures (HCC)    last seizure March 2015  . Small bowel obstruction (HCC) 11/25/2017     Significant Hospital Events   ICU admission 10/7   Presented to the ER with diarrhea and weakness. Hypotensive in spite of IVFs so PCCM consulted. CT abd w/out clear inflammatory changes. Elevated lactate. Pressors required.   10/8: vanc added. On 2 pressors. Repeat hgb down to 5.9 no clear evidence of bleeding. 2 units of blood ordered. abd more distended. Repeat CT abd/pelvis ordered. Repeat lipase ordered for "stigmata of recurrent pancreatitis" this was negative.   10/9 Received 12.5 mg of fentanyl around midnight, unresponsive around 4 AM Code stroke called, head CT negative. Witnessed seizure by RN and tele neurologist Given Ativan 3 mg in divided doses and loaded with Keppra   Consults:  Pulmonary critical care  Procedures:    Significant Diagnostic Tests:  CT head: Chronic atrophic changes, encephalomalacia of the right history with prior MCA. CT chest, abdomen and pelvis 10/7:Small bilateral pleural effusion, few bandlike densities, sigmoid diverticulosis loose stool within the colon no obvious inflammatory changes.  lipase 10/7: neg (22) repeat lipase 10/8: negative (21)  Repeat CT abd/pelvis  10/8>>> Sigmoid diverticulosis without inflammation. Calcifications are noted within the pancreas consistent with chronic pancreatitis. The colon is mildly distended and fluid-filled. The terminal leum demonstrates circumferential wall thickening concerning for enteritis or other inflammation. There is mild dilatation of more proximal small bowel with fecalization concerning for some degree of  obstruction. Potentially these findings may represent Crohn's Disease.  10/9 head CT >> Multifocal ischemic infarcts involving the right frontal, parietal, and occipital regions, left frontal lobe, and cerebellum. Remote lacunar infarcts noted involving the bilateral thalami and left basal ganglia. Appearance is relatively stable.  10/8 renal US >> nml  Micro Data:  C. difficile 10/7: neg GI panel panel 10/7>>>neg COVID-19 negative BC 10/7 ng  Antimicrobials:  Piperacillin tazobactam 10/7>>>  Interim history/subjective:   Remains poorly responsive Critically ill, although off pressors 1 temperature of 96 5 recorded, no euthermic Poor urine output 350 cc  Objective   Blood pressure (!) 61/46, pulse 100, temperature 98.3 F (36.8 C), temperature source Oral, resp. rate (!) 24, height 5' (1.524 m), weight 55.4 kg, SpO2 98 %.        Intake/Output Summary (Last 24 hours) at 08/29/2020 1621 Last data filed at 08/29/2020 1400 Gross per 24 hour  Intake 1604.67 ml  Output 230 ml  Net 1374.67 ml   Filed Weights   08/27/20 0316 08/28/20 0414 08/29/20 0500  Weight: 55.5 kg 55.5 kg 55.4 kg    Examination:  Gen:   To intubation the patient was unresponsive.  She had sonorous respirations and was drooling.    HEENT: No external evidence of trauma  Neck:     No JVD  Lungs:    Respirations are unlabored despite intermittently obstructing.  There are lots of transmitted upper airway sounds and she is drooling.  There is symmetric air movement lungs appear to be clear.   CV:         S1 and S2 are regular without murmur rub or gallop  Abd:   Abdomen is somewhat rotund without any obvious organomegaly masses tenderness guarding or rebound bowel sounds are absent  Ext:    She has 1-2+ anasarca  Skin:      Warm and dry; no rash Neuro: Pulls are equal, EOMs are currently abnormal by doll's eyes or seems to be a slight right facial droop.  In response to noxious stimuli she internally  rotates the right upper extremities there is no other motor movement. Labs show stable hemoglobin, no leukocytosis  Resolved Hospital Problem list     Assessment & Plan:   Complex partial seizure, known seizure disorder  -prior notes 2019 show that she has been on Keppra and Vimpat Acute metabolic encephalopathy superimposed on prior CVA likely contributing to expressive aphasia  Head CT negative I am concerned by the use of valproic acid in this patient, we will need to follow serial levels and pay careful attention to her already compromised hepatic function.  I have elected to use propofol for sedation on mechanical ventilation due to its anticonvulsant properties pending reading of her EEG.  -Continue Keppra and valproate.   Shock w/ lactic acidosis.  No infectious source has been isolated.  I am concerned that there may have been contributions from relative adrenal insufficiency with her chronic use of low-dose prednisone.  In addition she has multiple vascular calcifications concerned that she may have had substantial dehydration with relative ischemia accounting for her bowel wall thickening.  We will be continuing to generously hydrate the patient continue stress  dose steroids for now potentially having the secondary effect of treating any underlying inflammatory bowel disease. Repeat CT abdomen still points to enteritis -History of VRE UTI in 12/2018 , hence on contact  isolation -History of C. difficile in the remote past recent admission was for inflammatory/infectious colitis and suspected to have UTI although urine culture negative .  She was treated with antibiotics from 9/26-10/1 but did not complete another 5 days of Cipro and Flagyl prescribed at home  Plan Continue Zosyn for 5 to 7 days Vancomycin discontinued since MRSA negative Cont stress dose steroids, on 5 mg of prednisone for?  Asthma  Mixed metabolic acidosis. Appears to be both anion and non-anion gap related. 2/2  lactic acidosis c/b NaCl resuscitation and bicarb loss from diarrhea -Lactic acidosis has resolved Plan dc bicarbonate drip , ongoing losses due to diarrhea Hypocalcemia and hypomagnesemia have been repleted  AKI secondary to above->has improved some w/ resuscitation efforts Plan:  Avoid nephrotoxins Serial chemistries Keep foley in place   Chronic diarrhea/CT suggests chronic pancreatitis/recurrent enteritis on CT -Prior EGD 2019 as shown duodenal ulcers -Prior colonoscopy 2019 showed sigmoid diverticulosis  -GI consult appreciated, although possibility of IBD has been raised, wonder if presentation of intermittent abdominal pain and distention is more consistent with ischemic bowel  Coagulopathy ->was on apixaban for afib ? Got vitamin K 10/7 Plan Holding apixiban  Check INR daily   Thrombocytopenia likely 2/2 sepsis , doubt we have to consider TTP here Plan Serial cbcs   Anemia of unclear etiology->looks like baseline around 7.4 to 8 -down to 5.9-->2U PRBC --> 9.8 No evidence of hemolysis, attributed to chronic disease Peripheral smear did not show schistocytes, did show teardrop cells?  Do we have to consider MDS here Plan Check haptoglobin  for completion Will need heme consult at some point  Hyperglycemia  Plan ssi     Best practice:  Diet: npo Pain/Anxiety/Delirium protocol (if indicated): Not applicable VAP protocol (if indicated): Not applicable DVT prophylaxis: Holding due to elevated INR GI prophylaxis: Not applicable Glucose control: CBGs Mobility: Bedrest Code Status: Full Family Communication: Son updated this morning Disposition: ICU >> transfer to [facility] ICU when bed available  Critical care time x 40 minutes  08/29/2020   Penny Pia, MD

## 2020-08-29 NOTE — Progress Notes (Signed)
Stat EEG EEG complete - results pending.

## 2020-08-29 NOTE — Progress Notes (Signed)
elink notifed re patient low urine output. Awaiting new orders

## 2020-08-29 NOTE — Procedures (Addendum)
Patient Name: Amber [name]  MRN: [id number]  Epilepsy Attending: Charlsie Quest  Referring Physician/Provider: Dr Juanetta Snow Date: 08/29/2020 Duration: 23.05 mins  Patient history: 64yo F with h/o epilepsy presented with seizure like activity. EEg to evaluate for seizure.   Level of alertness: Lethargic  AEDs during EEG study: LEV, VPA, Propofol  Technical aspects: This EEG study was done with scalp electrodes positioned according to the 10-20 International system of electrode placement. Electrical activity was acquired at a sampling rate of 500Hz  and reviewed with a high frequency filter of 70Hz  and a low frequency filter of 1Hz . EEG data were recorded continuously and digitally stored.   Description:   EEG showed continuous generalized 3 to 6 Hz theta-delta slowing. Generalized periodic epileptiform discharges with triphasic morphology at 1-1.5hz  were also noted intermittently.  Hyperventilation and photic stimulation were not performed.     ABNORMALITY -Continuous slow, generalized -Periodic epileptiform discharges with triphasic morphology, generalized   IMPRESSION: This study showed evidence of generalized epileptogenicity as well as severe diffuse encephalopathy. The periodic epileptiform discharges with triphasic morphology are on the ictal-interictal continuum with low potential for seizures. Thee discharges can also be seen due to hyperammonemia, uremia, cefepime toxicity. No seizures were seen during this study.   [name] 

## 2020-08-29 NOTE — Procedures (Signed)
Intubation Procedure Note Amber [name]  [id number]  06-11-1956    Procedure: Intubation Indications: Airway protection and maintenance   Procedure Details Consent: Unable to obtain consent because of altered level of consciousness. Time Out: Performed   Maximum clean technique was used including gloves, hand hygiene and mask  Laryngoscopy equipment used: MAC 4 Glidescope   Medications: Etomidate 20 mg, Rocuronium 70 mg   Grade 1 airway view   Evaluation Hemodynamic Status: BP stable throughout, stable throughout Patient's Current Condition: stable Patient did tolerate procedure well Chest X-ray ordered to verify placement.  pending   Dr. Jose Persia Internal Medicine PGY-2  08/29/2020, 4:29 PM

## 2020-08-29 NOTE — Consult Note (Addendum)
NEURO HOSPITALIST CONSULT NOTE   Requestig physician: Dr. Davonna Belling  Reason for Consult:Status Epilepticus  History obtained from:  Chart and Staff  HPI:                                                                                                                                          Amber [name] is a 64 year old diabetic female with known seizure disorder, on valproic acid as an outpatient, prior stroke, DM, HTN and CHF who presented to the [facility] ER on 10/7 with diarrhea and weakness. She was noted to be hypotensive despite fluid resuscitation. Lactate was elevated. She was admitted to the ICU for management of possible sepsis. She remained hypotensive but mental status was good, being awake, alert and following commands. Continued to be awake and alert on the day after admission. She was severely anemic and also required 2 pressors. On 10/9 in the early AM she was with continued hypotension and then became oliguric. She received fentanyl and subsequently became unarousable, requiring Narcan administration, with no improvement. Code Stroke was called and in CT the patient started to have seizure-like activity. She was loaded with IV Keppra and given 1 mg Ativan x 2, but continued to have seizure activity; a third dose of Ativan resulted in cessation of seizure activity. The consulting Teleneurologist diagnosed subclinical status epilepticus and recommended EEG monitoring. Head CT revealed stable appearance of advanced age-related cerebral atrophy, multifocal ischemic infarcts involving the right frontal, parietal, and occipital regions, left frontal lobe, and cerebellum, as well as remote lacunar infarcts involving the bilateral thalami and left basal ganglia.  Consulting Teleneurologist's note from 10/9 was reviewed: "The patient appears to be in complex partial status. She was given a total of 2 mg of Ativan IV which did not seem to stop arrhythmic twitching movements. she has  a right gaze with rhythmic facial movements and bilateral arm movements. 1 g Keppra load ordered. Depakote level needs to be obtained. CT of the head did not show any new acute changes appreciated chronic right hemispheric infarction. the patient has a contrast allergy with active ongoing seizure activity cannot obtain CTA head and neck. Suspicion is for status epilepticus rather than stroke although exam is limited due to seizure activity. Her seizure activity continues in spite of her Ativan she will likely require additional doses and likely intubation. She will need stat EEG with ceeg"  Following the Teleneurology consult an additional dose of 2 mg IV Ativan was administered with cessation of seizure activity. She has had no further seizure activity since then, but has remained with decreased responsiveness, although able to localize to noxious stimuli per report.   She has been transferred to the 4N ICU at [facility] for EEG and Neurology evaluation. Upon arrival, pt needed  to be emergently intubated, thus neuro exam is limited d/t paralytic/sedation. GCS 3-4 per RN prior to intubation procedure.    Past Medical History:  Diagnosis Date  . ACS (acute coronary syndrome) ([place]) 06/12/2015  . Acute on chronic blood loss anemia 11/25/2017  . Arthritis   . Asthma   . Cerebrovascular accident (CVA) due to embolism ([place]) 10/26/2016  . CHF (congestive heart failure) ([place])   . Chronic pain   . Constipation   . Diabetes mellitus without complication ([place])   . Duodenitis   . Embolic stroke ([place])   . Falls frequently   . Focal seizure ([place])   . GERD (gastroesophageal reflux disease)   . Hypertension   . Impacted fracture 02/15/2017   right glenoid  . Pneumonia 10/2015  . Seizures ([place])    last seizure March 2015  . Small bowel obstruction ([place]) 11/25/2017    Past Surgical History:  Procedure Laterality Date  . APPENDECTOMY    . BACK SURGERY    . CHOLECYSTECTOMY    . TEE WITHOUT CARDIOVERSION N/A  10/30/2016   Procedure: TRANSESOPHAGEAL ECHOCARDIOGRAM (TEE);  Surgeon: Dorothy Spark, MD;  Location: [facility] ENDOSCOPY;  Service: Cardiovascular;  Laterality: N/A;    Family History  Problem Relation Age of Onset  . Kidney failure Mother   . Hypertension Sister   . Hypertension Brother           Social History:  reports that she quit smoking about 11 years ago. She has never used smokeless tobacco. She reports previous alcohol use. She reports that she does not use drugs.  Allergies  Allergen Reactions  . Ivp Dye [Iodinated Diagnostic Agents] Itching  . Metrizamide Itching    MEDICATIONS:     . chlorhexidine gluconate (MEDLINE KIT)  15 mL Mouth Rinse BID  . Chlorhexidine Gluconate Cloth  6 each Topical Daily  . fludrocortisone  0.1 mg Oral Daily  . Gerhardt's butt cream   Topical BID  . hydrocortisone sodium succinate  50 mg Intravenous Q6H  . insulin aspart  0-6 Units Subcutaneous Q4H  . mouth rinse  15 mL Mouth Rinse 10 times per day  . nystatin  5 mL Oral QID  . phenylephrine      . sodium chloride flush  10-40 mL Intracatheter Q12H        . [START ON 08/30/2020] lacosamide (VIMPAT) IV    . lacosamide (VIMPAT) IV    . levETIRAcetam    . norepinephrine (LEVOPHED) Adult infusion Stopped (08/28/20 1608)  . piperacillin-tazobactam (ZOSYN)  IV Stopped (08/29/20 1658)  . propofol (DIPRIVAN) infusion 20 mcg/kg/min (08/29/20 1900)  . sodium chloride    . valproate sodium                                                                                                             ROS:  Unable d/t clinical condition, GCS 3 and ETT   Blood pressure (!) 78/45, pulse 87, temperature 97.8 F (36.6 C), temperature source Axillary, resp. rate (!) 21, height 5' (1.524 m), weight 55.4 kg, SpO2 95 %.   General Examination:                                                                                                    Physical Exam  HEENT-  [place]/AT  Lungs- Intubated. Saturations within normal limits on vent Extremities- Diffuse upper and lower extremity edema. Thready distal pulses at this time Skin-pale, cool. no hyperpigmentation, vitiligo, or suspicious lesions  Neurological Examination Limited exam d/t recent intubation procedure using paralytic/sedation. GCS3t, no movement seen, Pupils are pinpoint and non-reactive, no cough/gag. Limbs are flaccid x 4. No posturing or clinical seizure-like activity is seen.    Lab Results: Basic Metabolic Panel: Recent Labs  Lab 08/27/20 0234 08/27/20 0914 08/27/20 1103 08/27/20 1103 08/27/20 1700 08/27/20 1700 08/28/20 0408 08/28/20 1752 08/29/20 0559 08/29/20 1147  NA 140   < > 142  --  138  --  135 141  --  141  K 3.5   < > 3.5  --  3.7  --  3.5 3.2*  --  3.1*  CL 112*   < > 109  --  109  --  104 104  --  101  CO2 12*   < > 15*  --  17*  --  19* 22  --  24  GLUCOSE 151*   < > 167*  --  211*  --  206* 139*  --  123*  BUN 10   < > 10  --  10  --  12 13  --  15  CREATININE 2.00*   < > 2.09*  --  2.15*  --  2.23* 2.30*  --  2.45*  CALCIUM 6.3*   < > 6.9*   < > 6.5*   < > 6.3* 6.8*  --  7.2*  MG 1.2*  --   --   --   --   --   --   --  2.0  --   PHOS 4.6  --   --   --   --   --   --   --  4.7*  --    < > = values in this interval not displayed.    CBC: Recent Labs  Lab 08/26/20 1750 08/27/20 0234 08/27/20 1112 08/28/20 0408 08/29/20 0559  WBC 12.0* 8.6 7.6 9.3 7.1  NEUTROABS 7.6  --   --   --   --   HGB 8.1* 5.9* 9.8* 10.1* 9.8*  HCT 23.7* 17.8* 28.4* 28.7* 27.6*  MCV 78.0* 81.3 79.8* 77.6* 76.9*  PLT 136* 91* 89* 86* 59*    Cardiac Enzymes: No results for input(s): CKTOTAL, CKMB, CKMBINDEX, TROPONINI in the last 168 hours.  Lipid Panel: No results for input(s): CHOL, TRIG, HDL, CHOLHDL, VLDL, LDLCALC in the last 168 hours.  Imaging: CT HEAD CODE STROKE WO CONTRAST  Result Date:  08/28/2020 CLINICAL DATA:  Code stroke. Initial evaluation for acute neuro deficit, possible seizure activity. EXAM: CT HEAD WITHOUT CONTRAST TECHNIQUE: Contiguous axial images were obtained from the base of the skull through the vertex without intravenous contrast. COMPARISON:  Prior CT from 08/26/2020. FINDINGS: Brain: Advanced age-related cerebral atrophy. 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 Remote lacunar infarcts noted involving the bilateral thalami and left basal ganglia. Appearance is relatively stable. No acute intracranial hemorrhage. No acute large vessel territory infarct. No mass lesion or midline shift. No hydrocephalus or extra-axial fluid collection. Vascular: No hyperdense vessel. Calcified atherosclerosis present at skull base. Skull: Scalp soft tissues and calvarium within normal limits. Sinuses/Orbits: Globes and orbital soft tissues demonstrate no acute finding. Paranasal sinuses are clear. No mastoid effusion. Other: None. ASPECTS [facility] Stroke Program Early CT Score) - Ganglionic level infarction (caudate, lentiform nuclei, internal capsule, insula, M1-M3 cortex): 7 - Supraganglionic infarction (M4-M6 cortex): 3 Total score (0-10 with 10 being normal): 10 IMPRESSION: 1. Stable head CT.  No acute intracranial abnormality identified 2. ASPECTS is 10. 3. Advanced cerebral atrophy with chronic microvascular ischemic disease, with multiple remote ischemic infarcts as above. Critical Value/emergent results were called by telephone at the time of interpretation on 08/28/2020 at 6:44 am to provider Trevor Mace , who verbally acknowledged these results. Electronically Signed   By: Jeannine Boga M.D.   On: 08/28/2020 06:47   Neurology NP participating in the patient's care today: Desiree Metzger-Cihelka, ARNP-C, ANVP-BC Pager: [phone]   Assessment: 64 year old diabetic female who presented to  the ER with diarrhea and weakness. She has known seizure disorder and by report takes VPA $RemoveB'250mg'STdUApBw$  TID at home. Since admit, she became hypotensive despite fluid resuscitation. She was admitted to the ICU for management of possible sepsis. She remained hypotensive but mental status was good, being awake, alert and following commands. Continued to be awake and alert on the day after admission. She was severely anemic and also required 2 pressors. On 10/9 in the early AM she was with continued hypotension and then became oliguric. She received fentanyl and subsequently became unarousable, requiring Narcan administration, with no improvement. Code Stroke was called and in CT the patient started to have seizure-like activity. The consulting Teleneurologist diagnosed  subclinical status epilepticus. She was loaded with IV Keppra and given 1 mg Ativan x 2, but continued to have seizure activity; a third dose of Ativan resulted in cessation of seizure activity, but she was unresponsive afterwards.  1. Seizure disorder, intractable with Status Epilpticus, now resolved. EEG reveals GPEDs with no electrographic seizures. Pt has many reasons to have breakthrough seizures and lowered sizure threshold as she is critically ill and in shock.  2. AMS - likely multifactorial, with postictal state, sepsis and medication effect playing a role, in addition to severe anemia. Report at [facility] hospital was that she was lethargic and verbally unresponsive, but with some purposeful responses to noxious stimuli reported prior to transfer. Upon arrival to ICU at [facility], [place] reported GCS 3-4 at best, pt required emergent intubation for acute hypoxic respiratory failure.  3. Acute anemia- h/h down to 5.9. Likely GI cause as she is being worked up for Crohns per GI. SED/CRP elevated.  IV steroids currently started.  4. Shock/hypotension- likely multiple contributing issues as above. On pressors as needed 5. AKI- Cr 2.4, monitoring 6. Afib- not on [place]  due to cirrhosis with coagulopathy 7. Pancreatitis, cirrhosis with coagulopathy- PLT of 59, INR 4.3  despite not being on [place]. CTH neg for ICH.  8. Spot EEG upon arrival to the 4N ICU reveals generalized continuous slowing and generalized periodic epileptiform discharges (GPEDs) with triphasic morphology. The study is consistent with generalized epileptogenicity as well as severe diffuse encephalopathy. The periodic epileptiform discharges with triphasic morphology are on the ictal-interictal continuum with low potential for seizures. These discharges can also be seen due to hyperammonemia, uremia and/or cefepime toxicity. No seizures were seen during this study. 9. Ammonia level was 49 yesterday   Recommendations: - Continue EEG monitoring with LTM - Increasing total daily dose of VPA to 1000 mg. Changing from 250 mg TID to 500 mg BID.  - Have considered addition of Vimpat but will hold off as she has paroxysmal atrial fibrillation.  - Repeat VPA level pending - Increased Keppra this evening from 500 mg BID to $Remo'1500mg'GdCOh$  IV BID - Keep Hgb >8 - MRI brain when stable - Supportive care - Manage underlying conditions as you are  40 minutes spent in the neurological evaluation and management of this critically ill patient.   Electronically signed: Dr. Kerney Elbe

## 2020-08-29 NOTE — Progress Notes (Addendum)
NAME:  Amber [name], MRN:  [id number], DOB:  08-31-1956, LOS: 3 ADMISSION DATE:  08/26/2020, CONSULTATION DATE: 08/26/2020 REFERRING MD: Dr. Lockie Mola, CHIEF COMPLAINT: Hypertension  Brief History    64 year old diabetic admitted with hypotension, lactic acidosis, diarrhea and weakness. CT abdomen/pelvis showed stigmata of recurrent pancreatitis, no other acute findings.  Overnight required pressors. Initial lipase was negative C. difficile negative Hemoglobin dropped from 8.0 on admission to 5.9, given 2 units PRBC.  recent admission was for inflammatory/infectious colitis and suspected to have UTI although urine culture negative .  She was treated with antibiotics from 9/26-10/1 but did not complete another 5 days of Cipro and Flagyl prescribed at home  PMH - CAD, CVA, type 2 diabetes, small bowel obstruction, hypertension, gastroesophageal reflux, seizure.     Past Medical History   Past Medical History:  Diagnosis Date  . ACS (acute coronary syndrome) ([facility]) 06/12/2015  . Acute on chronic blood loss anemia 11/25/2017  . Arthritis   . Asthma   . 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 CHF (congestive heart failure) ([facility])   . Chronic pain   . Constipation   . Diabetes mellitus without complication ([facility])   . Duodenitis   . Embolic stroke ([facility])   . Falls frequently   . Focal seizure ([facility])   . GERD (gastroesophageal reflux disease)   . Hypertension   . Impacted fracture 02/15/2017   right glenoid  . Pneumonia 10/2015  . Seizures ([facility])    last seizure March 2015  . Small bowel obstruction ([facility]) 11/25/2017     Significant Hospital Events   ICU admission 10/7   Presented to the ER with diarrhea and weakness. Hypotensive in spite of IVFs so PCCM consulted. CT abd w/out clear inflammatory changes. Elevated lactate. Pressors required.   10/8: vanc added. On 2 pressors. Repeat hgb down to 5.9 no clear evidence of bleeding. 2 units of blood ordered. abd more  distended. Repeat CT abd/pelvis ordered. Repeat lipase ordered for "stigmata of recurrent pancreatitis" this was negative.   10/9 Received 12.5 mg of fentanyl around midnight, unresponsive around 4 AM Code stroke called, head CT negative. Witnessed seizure by RN and tele neurologist Given Ativan 3 mg in divided doses and loaded with Keppra   Consults:  Pulmonary critical care  Procedures:    Significant Diagnostic Tests:  CT head: Chronic atrophic changes, encephalomalacia of the right history with prior MCA. CT chest, abdomen and pelvis 10/7:Small bilateral pleural effusion, few bandlike densities, sigmoid diverticulosis loose stool within the colon no obvious inflammatory changes.  lipase 10/7: neg (22) repeat lipase 10/8: negative (21)  Repeat CT abd/pelvis 10/8>>> Sigmoid diverticulosis without inflammation. Calcifications are noted within the pancreas consistent with chronic pancreatitis. The colon is mildly distended and fluid-filled. The terminal leum demonstrates circumferential wall thickening concerning for enteritis or other inflammation. There is mild dilatation of more proximal small bowel with fecalization concerning for some degree of obstruction. Potentially these findings may represent Crohn's Disease.  10/9 head CT >> Multifocal ischemic infarcts involving the right frontal, parietal, and occipital regions, left frontal lobe, and cerebellum. Remote lacunar infarcts noted involving the bilateral thalami and left basal ganglia. Appearance is relatively stable.  10/8 renal US >> nml  Micro Data:  C. difficile 10/7: neg GI panel panel 10/7>>>neg COVID-19 negative BC 10/7 ng  Antimicrobials:  Piperacillin tazobactam 10/7>>>  Interim history/subjective:   Remains poorly responsive Critically ill, although off pressors 1 temperature of 96 5 recorded, no  euthermic Poor urine output 350 cc  Objective   Blood pressure (!) 78/45, pulse 78, temperature 97.8 F (36.6  C), temperature source Axillary, resp. rate 20, weight 55.5 kg, SpO2 97 %.        Intake/Output Summary (Last 24 hours) at 08/29/2020 0925 Last data filed at 08/29/2020 0600 Gross per 24 hour  Intake 1944.84 ml  Output 330 ml  Net 1614.84 ml   Filed Weights   08/27/20 0316 08/28/20 0414  Weight: 55.5 kg 55.5 kg    Examination:  Gen:   Appears older than stated age, mild distress  HEENT:  EOMI, sclera anicteric, mild pallor Neck:     No JVD; no thyromegaly Lungs:    Bilateral breath sounds present, scattered rhonchi CV:         Regular rate and rhythm; no murmurs Abd:   Mild distention, nontender Ext:    2+ edema; adequate peripheral perfusion Skin:      Warm and dry; no rash Neuro: Unresponsive, tries to localize to deep pain stimulus , twitching activity including blinking both eyes with stimulus , no other purposeful movement  Labs show stable hemoglobin, no leukocytosis  Resolved Hospital Problem list     Assessment & Plan:   Complex partial seizure, known seizure disorder  -prior notes 2019 show that she has been on Keppra and VimpatAcute metabolic encephalopathy superimposed on prior CVA likely contributing to expressive aphasia  Head CT negative  -Continue Keppra and valproate -Neurology involved, needs LTM EEG , but no beds available at [facility].  Both neurologist Dr. Otelia Limes and I have made multiple attempts including speaking to charge nurse both here and at [facility] and to bed placement multiple times over the last 24 hours -Able to maintain airway at this point but continue to monitor   Septic shock w/ lactic acidosis.  Resolved source is not clear.  Urinary sepsis vs abd sepsis Repeat CT abdomen still points to enteritis -History of VRE UTI in 12/2018 , hence on contact  isolation -History of C. difficile in the remote past recent admission was for inflammatory/infectious colitis and suspected to have UTI although urine culture negative .  She was treated with  antibiotics from 9/26-10/1 but did not complete another 5 days of Cipro and Flagyl prescribed at home  Plan Continue Zosyn for 5 to 7 days Vancomycin discontinued since MRSA negative Cont stress dose steroids, on 5 mg of prednisone for?  Asthma  Mixed metabolic acidosis. Appears to be both anion and non-anion gap related. 2/2 lactic acidosis c/b NaCl resuscitation and bicarb loss from diarrhea -Lactic acidosis has resolved Plan dc bicarbonate drip , ongoing losses due to diarrhea Hypocalcemia and hypomagnesemia have been repleted  AKI secondary to above->has improved some w/ resuscitation efforts Plan:  Avoid nephrotoxins Serial chemistries Keep foley in place   Chronic diarrhea/CT suggests chronic pancreatitis/recurrent enteritis on CT -Prior EGD 2019 as shown duodenal ulcers -Prior colonoscopy 2019 showed sigmoid diverticulosis  -GI consult appreciated, although possibility of IBD has been raised, wonder if presentation of intermittent abdominal pain and distention is more consistent with ischemic bowel  Coagulopathy ->was on apixaban for afib ? Got vitamin K 10/7 Plan Holding apixiban  Check INR daily   Thrombocytopenia likely 2/2 sepsis , doubt we have to consider TTP here Plan Serial cbcs   Anemia of unclear etiology->looks like baseline around 7.4 to 8 -down to 5.9-->2U PRBC --> 9.8 No evidence of hemolysis, attributed to chronic disease Peripheral smear did not show schistocytes,  did show teardrop cells?  Do we have to consider MDS here Plan Check haptoglobin  for completion Will need heme consult at some point  Hyperglycemia  Plan ssi     Best practice:  Diet: npo Pain/Anxiety/Delirium protocol (if indicated): Not applicable VAP protocol (if indicated): Not applicable DVT prophylaxis: Holding due to elevated INR GI prophylaxis: Not applicable Glucose control: CBGs Mobility: Bedrest Code Status: Full Family Communication: Son updated this  morning Disposition: ICU >> transfer to [facility] ICU when bed available  Critical care time x 31 minutes  Cyril Mourning MD. [facility]. [place] [facility] See Amion for pager  If no response to pager , please call [phone]  After 7:00 pm call Elink  [phone]   08/29/2020

## 2020-08-29 NOTE — Progress Notes (Signed)
Pharmacy Antibiotic Note  Amber [name] is a 64 y.o. female admitted on 08/26/2020 having been discharged a week ago due to having UTI.  Pt was sent home with abx but has not taken any since she was discharged.  Pt brought in with AMS.  Pharmacy to dose Zosyn for sepsis  Day 3 abx, SCr continues elevated > 2, with Cl ~ 20 ml/min  Plan: Zosyn 3.375gm IV x 1 then 2.25gm IV q6h for CrCl < 3mls/min Follow renal function, cultures and clinical coarse  Weight: 55.5 kg (122 lb 5.7 oz)  Temp (24hrs), Avg:97.2 F (36.2 C), Min:96.5 F (35.8 C), Max:97.8 F (36.6 C)  Recent Labs  Lab 08/26/20 1739 08/26/20 1750 08/26/20 1750 08/26/20 1939 08/26/20 2358 08/27/20 0234 08/27/20 0234 08/27/20 0914 08/27/20 0938 08/27/20 1103 08/27/20 1112 08/27/20 1213 08/27/20 1700 08/28/20 0408 08/28/20 1752 08/29/20 0559  WBC  --  12.0*  --   --   --  8.6  --   --   --   --  7.6  --   --  9.3  --  7.1  CREATININE  --  2.43*   < >  --   --  2.00*   < > 2.13*  --  2.09*  --   --  2.15* 2.23* 2.30*  --   LATICACIDVEN 6.7*  --   --  6.8* 7.5*  --   --   --  4.1*  --   --  2.3*  --   --   --   --    < > = values in this interval not displayed.    Estimated Creatinine Clearance: 19.8 mL/min (A) (by C-G formula based on SCr of 2.3 mg/dL (H)).    Allergies  Allergen Reactions   Ivp Dye [Iodinated Diagnostic Agents] Itching   Metrizamide Itching   Antimicrobials this admission:  10/7 zosyn >> 10/8 Vanc x1  Dose adjustments this admission:   Microbiology results:  10/7 BCx: ngtd 10/7 UCx: ng final 10/7 GI panel PCR: neg 10/7 CDiff: neg/neg 10/7 MRSA PCR: neg  Thank you for allowing pharmacy to be a part of this patients care.  Otho Bellows PharmD 08/29/2020, 10:00 AM

## 2020-08-29 NOTE — Progress Notes (Signed)
Subjective: Patient continues to be lethargic and nonresponsive, however is able to maintain her airway. She is off IV pressors, last documented bowel movement was brown, she stool on 08/27/2020.  Objective: Vital signs in last 24 hours: Temp:  [96.5 F (35.8 C)-97.8 F (36.6 C)] 97.8 F (36.6 C) (10/10 0808) Pulse Rate:  [73-88] 88 (10/10 1000) Resp:  [18-23] 23 (10/10 1000) SpO2:  [95 %-100 %] 99 % (10/10 1000) Arterial Line BP: (88-135)/(55-81) 135/81 (10/10 1000) Weight change:  Last BM Date: 08/28/20  PE: Unresponsive, on oxygen via nasal cannula GENERAL: Mild pallor, mild icterus ABDOMEN: Distended, tense, tympanic on percussion, sluggish bowel sounds EXTREMITIES: Mild bipedal edema  Lab Results: Results for orders placed or performed during the hospital encounter of 08/26/20 (from the past 48 hour(s))  Glucose, capillary     Status: Abnormal   Collection Time: 08/27/20 11:47 AM  Result Value Ref Range   Glucose-Capillary 158 (H) 70 - 99 mg/dL    Comment: Glucose reference range applies only to samples taken after fasting for at least 8 hours.  Lactic acid, plasma     Status: Abnormal   Collection Time: 08/27/20 12:13 PM  Result Value Ref Range   Lactic Acid, Venous 2.3 (HH) 0.5 - 1.9 mmol/L    Comment: CRITICAL VALUE NOTED.  VALUE IS CONSISTENT WITH PREVIOUSLY REPORTED AND CALLED VALUE. Performed at [facility], [place] [address], [place], [place] [address]   Glucose, capillary     Status: Abnormal   Collection Time: 08/27/20  3:43 PM  Result Value Ref Range   Glucose-Capillary 209 (H) 70 - 99 mg/dL    Comment: Glucose reference range applies only to samples taken after fasting for at least 8 hours.  Basic metabolic panel     Status: Abnormal   Collection Time: 08/27/20  5:00 PM  Result Value Ref Range   Sodium 138 135 - 145 mmol/L   Potassium 3.7 3.5 - 5.1 mmol/L   Chloride 109 98 - 111 mmol/L   CO2 17 (L) 22 - 32 mmol/L   Glucose, Bld 211 (H)  70 - 99 mg/dL    Comment: Glucose reference range applies only to samples taken after fasting for at least 8 hours.   BUN 10 8 - 23 mg/dL   Creatinine, Ser 2.15 (H) 0.44 - 1.00 mg/dL   Calcium 6.5 (L) 8.9 - 10.3 mg/dL   GFR, Estimated 24 (L) >60 mL/min   Anion gap 12 5 - 15    Comment: Performed at [facility], [place] [address] [place], [place] [address]  Glucose, capillary     Status: Abnormal   Collection Time: 08/27/20  7:43 PM  Result Value Ref Range   Glucose-Capillary 202 (H) 70 - 99 mg/dL    Comment: Glucose reference range applies only to samples taken after fasting for at least 8 hours.   Comment 1 Notify RN    Comment 2 Document in Chart   Glucose, capillary     Status: Abnormal   Collection Time: 08/27/20 11:38 PM  Result Value Ref Range   Glucose-Capillary 193 (H) 70 - 99 mg/dL    Comment: Glucose reference range applies only to samples taken after fasting for at least 8 hours.   Comment 1 Notify RN    Comment 2 Document in Chart   Glucose, capillary     Status: Abnormal   Collection Time: 08/28/20  3:48 AM  Result Value Ref Range   Glucose-Capillary 209 (H)  70 - 99 mg/dL    Comment: Glucose reference range applies only to samples taken after fasting for at least 8 hours.   Comment 1 Notify RN    Comment 2 Document in Chart   CBC     Status: Abnormal   Collection Time: 08/28/20  4:08 AM  Result Value Ref Range   WBC 9.3 4.0 - 10.5 K/uL   RBC 3.70 (L) 3.87 - 5.11 MIL/uL   Hemoglobin 10.1 (L) 12.0 - 15.0 g/dL   HCT 28.7 (L) 36 - 46 %   MCV 77.6 (L) 80.0 - 100.0 fL   MCH 27.3 26.0 - 34.0 pg   MCHC 35.2 30.0 - 36.0 g/dL   RDW 16.7 (H) 11.5 - 15.5 %   Platelets 86 (L) 150 - 400 K/uL    Comment: REPEATED TO VERIFY Immature Platelet Fraction may be clinically indicated, consider ordering this additional test [id number] CONSISTENT WITH PREVIOUS RESULT    nRBC 0.0 0.0 - 0.2 %    Comment: Performed at [facility], [place] [address], [place], [place] [address]  Comprehensive metabolic panel     Status: Abnormal   Collection Time: 08/28/20  4:08 AM  Result Value Ref Range   Sodium 135 135 - 145 mmol/L   Potassium 3.5 3.5 - 5.1 mmol/L   Chloride 104 98 - 111 mmol/L   CO2 19 (L) 22 - 32 mmol/L   Glucose, Bld 206 (H) 70 - 99 mg/dL    Comment: Glucose reference range applies only to samples taken after fasting for at least 8 hours.   BUN 12 8 - 23 mg/dL   Creatinine, Ser 2.23 (H) 0.44 - 1.00 mg/dL   Calcium 6.3 (LL) 8.9 - 10.3 mg/dL    Comment: CRITICAL RESULT CALLED TO, READ BACK BY AND VERIFIED WITH: S,NSMITH AT 2353 ON 08/28/20 BY A,MOHAMED    Total Protein 5.2 (L) 6.5 - 8.1 g/dL   Albumin 2.4 (L) 3.5 - 5.0 g/dL   AST 51 (H) 15 - 41 U/L   ALT 13 0 - 44 U/L   Alkaline Phosphatase 217 (H) 38 - 126 U/L   Total Bilirubin 2.9 (H) 0.3 - 1.2 mg/dL   GFR, Estimated 23 (L) >60 mL/min   Anion gap 12 5 - 15    Comment: Performed at [facility], [place] [address] [place], [place] [address]  Glucose, capillary     Status: Abnormal   Collection Time: 08/28/20  5:59 AM  Result Value Ref Range   Glucose-Capillary 179 (H) 70 - 99 mg/dL    Comment: Glucose reference range applies only to samples taken after fasting for at least 8 hours.   Comment 1 Notify RN    Comment 2 Document in Chart   Glucose, capillary     Status: Abnormal   Collection Time: 08/28/20  8:04 AM  Result Value Ref Range   Glucose-Capillary 131 (H) 70 - 99 mg/dL    Comment: Glucose reference range applies only to samples taken after fasting for at least 8 hours.  Valproic acid level     Status: Abnormal   Collection Time: 08/28/20  9:25 AM  Result Value Ref Range   Valproic Acid Lvl 49 (L) 50.0 - 100.0 ug/mL    Comment: Performed at [facility], [place] [address] [place], [place] [address]  Sedimentation rate     Status: None   Collection Time: 08/28/20 11:39 AM  Result Value Ref Range  Sed Rate 20 0 - 22 mm/hr     Comment: Performed at [facility], [place] [address], [place], [place] [address]  Glucose, capillary     Status: Abnormal   Collection Time: 08/28/20 11:56 AM  Result Value Ref Range   Glucose-Capillary 162 (H) 70 - 99 mg/dL    Comment: Glucose reference range applies only to samples taken after fasting for at least 8 hours.  Glucose, capillary     Status: Abnormal   Collection Time: 08/28/20  3:34 PM  Result Value Ref Range   Glucose-Capillary 156 (H) 70 - 99 mg/dL    Comment: Glucose reference range applies only to samples taken after fasting for at least 8 hours.  Basic metabolic panel     Status: Abnormal   Collection Time: 08/28/20  5:52 PM  Result Value Ref Range   Sodium 141 135 - 145 mmol/L   Potassium 3.2 (L) 3.5 - 5.1 mmol/L   Chloride 104 98 - 111 mmol/L   CO2 22 22 - 32 mmol/L   Glucose, Bld 139 (H) 70 - 99 mg/dL    Comment: Glucose reference range applies only to samples taken after fasting for at least 8 hours.   BUN 13 8 - 23 mg/dL   Creatinine, Ser 2.30 (H) 0.44 - 1.00 mg/dL   Calcium 6.8 (L) 8.9 - 10.3 mg/dL   GFR, Estimated 22 (L) >60 mL/min   Anion gap 15 5 - 15    Comment: Performed at [facility], [place] [address], [place],  [address]  Glucose, capillary     Status: Abnormal   Collection Time: 08/28/20  8:17 PM  Result Value Ref Range   Glucose-Capillary 118 (H) 70 - 99 mg/dL    Comment: Glucose reference range applies only to samples taken after fasting for at least 8 hours.   Comment 1 Notify RN    Comment 2 Document in Chart   Glucose, capillary     Status: Abnormal   Collection Time: 08/28/20 11:37 PM  Result Value Ref Range   Glucose-Capillary 110 (H) 70 - 99 mg/dL    Comment: Glucose reference range applies only to samples taken after fasting for at least 8 hours.   Comment 1 Notify RN    Comment 2 Document in Chart   Glucose, capillary     Status: Abnormal   Collection Time: 08/29/20  3:59 AM  Result Value  Ref Range   Glucose-Capillary 120 (H) 70 - 99 mg/dL    Comment: Glucose reference range applies only to samples taken after fasting for at least 8 hours.   Comment 1 Notify RN    Comment 2 Document in Chart   CBC     Status: Abnormal   Collection Time: 08/29/20  5:59 AM  Result Value Ref Range   WBC 7.1 4.0 - 10.5 K/uL   RBC 3.59 (L) 3.87 - 5.11 MIL/uL   Hemoglobin 9.8 (L) 12.0 - 15.0 g/dL   HCT 27.6 (L) 36 - 46 %   MCV 76.9 (L) 80.0 - 100.0 fL   MCH 27.3 26.0 - 34.0 pg   MCHC 35.5 30.0 - 36.0 g/dL   RDW 17.6 (H) 11.5 - 15.5 %   Platelets 59 (L) 150 - 400 K/uL    Comment: REPEATED TO VERIFY Immature Platelet Fraction may be clinically indicated, consider ordering this additional test [id number] CONSISTENT WITH PREVIOUS RESULT    nRBC 0.0 0.0 - 0.2 %    Comment:  Performed at [facility], [place] [address], [place], [place] [address]  Magnesium     Status: None   Collection Time: 08/29/20  5:59 AM  Result Value Ref Range   Magnesium 2.0 1.7 - 2.4 mg/dL    Comment: Performed at [facility], [place] [address], [place], [place] [address]  Phosphorus     Status: Abnormal   Collection Time: 08/29/20  5:59 AM  Result Value Ref Range   Phosphorus 4.7 (H) 2.5 - 4.6 mg/dL    Comment: Performed at [facility] [place] [address], [place], [place] [address]    Studies/Results: CT ABDOMEN PELVIS WO CONTRAST  Result Date: 08/27/2020 CLINICAL DATA:  Acute generalized abdominal pain and distention. EXAM: CT ABDOMEN AND PELVIS WITHOUT CONTRAST TECHNIQUE: Multidetector CT imaging of the abdomen and pelvis was performed following the standard protocol without IV contrast. COMPARISON:  August 15, 2020. FINDINGS: Lower chest: Mild bilateral pleural effusions are noted with adjacent subsegmental atelectasis. Hepatobiliary: No focal liver abnormality is seen. Status post cholecystectomy. No biliary dilatation. Pancreas: Calcifications are noted  within the pancreas consistent with chronic pancreatitis. No acute abnormality is noted. Spleen: Normal in size without focal abnormality. Adrenals/Urinary Tract: Adrenal glands and kidneys are unremarkable. No hydronephrosis or renal obstruction is noted. No renal or ureteral calculi are noted. Urinary bladder is decompressed secondary to Foley catheter. Stomach/Bowel: The stomach appears normal. The colon is mildly distended and fluid-filled. Sigmoid diverticulosis is noted without inflammation. The terminal ileum demonstrates circumferential wall thickening concerning for enteritis or other inflammation. There is mild dilatation of more proximal small bowel with fecalization concerning for some degree of obstruction. Potentially these findings may represent Crohn's disease. Vascular/Lymphatic: Aortic atherosclerosis. No enlarged abdominal or pelvic lymph nodes. Reproductive: Uterus and bilateral adnexa are unremarkable. Other: Moderate anasarca is noted. Minimal ascites is noted in the pelvis. Musculoskeletal: No acute or significant osseous findings. IMPRESSION: 1. Mild bilateral pleural effusions are noted with adjacent subsegmental atelectasis. 2. Sigmoid diverticulosis without inflammation. 3. Moderate anasarca is noted. 4. Calcifications are noted within the pancreas consistent with chronic pancreatitis. 5. The colon is mildly distended and fluid-filled. The terminal ileum demonstrates circumferential wall thickening concerning for enteritis or other inflammation. There is mild dilatation of more proximal small bowel with fecalization concerning for some degree of obstruction. Potentially these findings may represent Crohn's disease. 6. Minimal ascites is noted in the pelvis. 7. Aortic atherosclerosis. Aortic Atherosclerosis (ICD10-I70.0). Electronically Signed   By: Marijo Conception M.D.   On: 08/27/2020 14:46   ECHOCARDIOGRAM COMPLETE  Result Date: 08/27/2020    ECHOCARDIOGRAM REPORT   Patient Name:    Amber [name] Date of Exam: 08/27/2020 Medical Rec #:  [id number]      Height:       60.0 in Accession #:    [id number]     Weight:       122.4 lb Date of Birth:  [birth year]      BSA:          1.515 m Patient Age:    64 years       BP:           78/45 mmHg Patient Gender: F              HR:           102 bpm. Exam Location:  Inpatient Procedure: 2D Echo, Cardiac Doppler and Color Doppler Indications:    Shock ([place])  History:  Patient has prior history of Echocardiogram examinations, most                 recent 12/25/2019. CHF; Risk Factors:Hypertension and Diabetes.  Sonographer:    Bernadene Person RDCS Referring Phys: [id number] [place]  1. Left ventricular ejection fraction, by estimation, is 60 to 65%. The left ventricle has normal function. The left ventricle has no regional wall motion abnormalities. Left ventricular diastolic parameters are indeterminate.  2. Right ventricular systolic function is normal. The right ventricular size is normal.  3. The mitral valve is normal in structure. No evidence of mitral valve regurgitation. No evidence of mitral stenosis.  4. Tricuspid valve regurgitation is moderate.  5. The aortic valve is normal in structure. Aortic valve regurgitation is not visualized. No aortic stenosis is present. FINDINGS  Left Ventricle: Left ventricular ejection fraction, by estimation, is 60 to 65%. The left ventricle has normal function. The left ventricle has no regional wall motion abnormalities. The left ventricular internal cavity size was normal in size. There is  no left ventricular hypertrophy. Left ventricular diastolic parameters are indeterminate. Right Ventricle: The right ventricular size is normal. No increase in right ventricular wall thickness. Right ventricular systolic function is normal. Left Atrium: Left atrial size was normal in size. Right Atrium: Right atrial size was normal in size. Pericardium: There is no evidence of pericardial effusion. Mitral  Valve: The mitral valve is normal in structure. No evidence of mitral valve regurgitation. No evidence of mitral valve stenosis. Tricuspid Valve: The tricuspid valve is grossly normal. Tricuspid valve regurgitation is moderate . No evidence of tricuspid stenosis. Aortic Valve: The aortic valve is normal in structure. Aortic valve regurgitation is not visualized. No aortic stenosis is present. Pulmonic Valve: The pulmonic valve was normal in structure. Pulmonic valve regurgitation is not visualized. No evidence of pulmonic stenosis. Aorta: The aortic root and ascending aorta are structurally normal, with no evidence of dilitation. IAS/Shunts: The atrial septum is grossly normal.  LEFT VENTRICLE PLAX 2D LVIDd:         4.20 cm  Diastology LVIDs:         2.50 cm  LV e' medial:    5.70 cm/s LV PW:         0.80 cm  LV E/e' medial:  12.3 LV IVS:        0.80 cm  LV e' lateral:   10.60 cm/s LVOT diam:     1.70 cm  LV E/e' lateral: 6.6 LV SV:         48 LV SV Index:   32 LVOT Area:     2.27 cm  RIGHT VENTRICLE RV S prime:     12.90 cm/s TAPSE (M-mode): 1.2 cm LEFT ATRIUM           Index       RIGHT ATRIUM           Index LA diam:      3.20 cm 2.11 cm/m  RA Area:     15.10 cm LA Vol (A2C): 24.2 ml 15.98 ml/m RA Volume:   35.60 ml  23.50 ml/m LA Vol (A4C): 31.0 ml 20.46 ml/m  AORTIC VALVE LVOT Vmax:   103.00 cm/s LVOT Vmean:  63.700 cm/s LVOT VTI:    0.212 m  AORTA Ao Root diam: 2.60 cm MITRAL VALVE               TRICUSPID VALVE MV Area (PHT): 3.03 cm  TR Peak grad:   28.7 mmHg MV Decel Time: 250 msec    TR Vmax:        268.00 cm/s MV E velocity: 70.20 cm/s MV A velocity: 75.20 cm/s  SHUNTS MV E/A ratio:  0.93        Systemic VTI:  0.21 m                            Systemic Diam: 1.70 cm Mertie Moores MD Electronically signed by Mertie Moores MD Signature Date/Time: 08/27/2020/2:20:59 PM    Final    CT HEAD CODE STROKE WO CONTRAST  Result Date: 08/28/2020 CLINICAL DATA:  Code stroke. Initial evaluation for acute  neuro deficit, possible seizure activity. EXAM: CT HEAD WITHOUT CONTRAST TECHNIQUE: Contiguous axial images were obtained from the base of the skull through the vertex without intravenous contrast. COMPARISON:  Prior CT from 08/26/2020. FINDINGS: Brain: Advanced age-related cerebral atrophy. 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 Remote lacunar infarcts noted involving the bilateral thalami and left basal ganglia. Appearance is relatively stable. No acute intracranial hemorrhage. No acute large vessel territory infarct. No mass lesion or midline shift. No hydrocephalus or extra-axial fluid collection. Vascular: No hyperdense vessel. Calcified atherosclerosis present at skull base. Skull: Scalp soft tissues and calvarium within normal limits. Sinuses/Orbits: Globes and orbital soft tissues demonstrate no acute finding. Paranasal sinuses are clear. No mastoid effusion. Other: None. ASPECTS [facility] Stroke Program Early CT Score) - Ganglionic level infarction (caudate, lentiform nuclei, internal capsule, insula, M1-M3 cortex): 7 - Supraganglionic infarction (M4-M6 cortex): 3 Total score (0-10 with 10 being normal): 10 IMPRESSION: 1. Stable head CT.  No acute intracranial abnormality identified 2. ASPECTS is 10. 3. Advanced cerebral atrophy with chronic microvascular ischemic disease, with multiple remote ischemic infarcts as above. Critical Value/emergent results were called by telephone at the time of interpretation on 08/28/2020 at 6:44 am to provider Trevor Mace , who verbally acknowledged these results. Electronically Signed   By: Jeannine Boga M.D.   On: 08/28/2020 06:47    Medications: I have reviewed the patient's current medications.  Assessment: Remains nonresponsive since her seizure episode yesterday  Terminal ileal wall thickening, mild dilation of proximal small bowel, distended and fluid-filled colon,  raising question of Crohn's disease  Pancreatitis  Cirrhosis-thrombocytopenic, platelet 59 today, INR was 4.3 on 08/27/2020, worsening renal function, creatinine 2.3, T bili was 2.9 yesterday  Infectious etiology ruled out as stool was negative for C. difficile and GI pathogen panel on 08/26/2020. ESR surprisingly normal at 20, CRP pending. Lactic acidosis improving   Plan: Patient is waiting to be transferred to [facility] for continuous EEG monitoring.  She is in no condition for undergoing endoscopic interventions.  She remains n.p.o. and is on Solu-Cortef 50 mg IV every 6 hours, if this is related to inflammatory bowel disease, IV steroids should help.  Overall prognosis guarded.    Ronnette Juniper, MD 08/29/2020, 11:17 AM

## 2020-08-29 NOTE — Progress Notes (Signed)
eLink Physician-Brief Progress Note Patient Name: Amber [name] DOB: 12-Dec-1955 MRN: [id number]   Date of Service  08/29/2020  HPI/Events of Note  Oliguria  eICU Interventions  Lasix 20 mg iv x 1        Tori Cupps U Brigett Estell 08/29/2020, 2:30 AM

## 2020-08-30 ENCOUNTER — Inpatient Hospital Stay (HOSPITAL_COMMUNITY): Payer: Medicaid Other

## 2020-08-30 DIAGNOSIS — R579 Shock, unspecified: Secondary | ICD-10-CM | POA: Diagnosis not present

## 2020-08-30 DIAGNOSIS — R569 Unspecified convulsions: Secondary | ICD-10-CM | POA: Diagnosis not present

## 2020-08-30 LAB — SODIUM, URINE, RANDOM: Sodium, Ur: <10 mmol/L

## 2020-08-30 LAB — PROTEIN / CREATININE RATIO, URINE
Creatinine, Urine: 72.38 mg/dL
Protein Creatinine Ratio: 0.95 mg/mg{Cre} — ABNORMAL HIGH (ref 0.00–0.15)
Total Protein, Urine: 69 mg/dL

## 2020-08-30 LAB — COMPREHENSIVE METABOLIC PANEL
ALT: 18 U/L (ref 0–44)
AST: 74 U/L — ABNORMAL HIGH (ref 15–41)
Albumin: 2 g/dL — ABNORMAL LOW (ref 3.5–5.0)
Alkaline Phosphatase: 446 U/L — ABNORMAL HIGH (ref 38–126)
Anion gap: 16 — ABNORMAL HIGH (ref 5–15)
BUN: 14 mg/dL (ref 8–23)
CO2: 21 mmol/L — ABNORMAL LOW (ref 22–32)
Calcium: 7.5 mg/dL — ABNORMAL LOW (ref 8.9–10.3)
Chloride: 101 mmol/L (ref 98–111)
Creatinine, Ser: 2.46 mg/dL — ABNORMAL HIGH (ref 0.44–1.00)
GFR, Estimated: 20 mL/min — ABNORMAL LOW (ref >60)
Glucose, Bld: 114 mg/dL — ABNORMAL HIGH (ref 70–99)
Potassium: 2.6 mmol/L — CL (ref 3.5–5.1)
Sodium: 138 mmol/L (ref 135–145)
Total Bilirubin: 2.6 mg/dL — ABNORMAL HIGH (ref 0.3–1.2)
Total Protein: 5.1 g/dL — ABNORMAL LOW (ref 6.5–8.1)

## 2020-08-30 LAB — CBC WITH DIFFERENTIAL/PLATELET
Abs Immature Granulocytes: 0.01 10*3/uL (ref 0.00–0.07)
Basophils Absolute: 0 10*3/uL (ref 0.0–0.1)
Basophils Relative: 0 %
Eosinophils Absolute: 0 10*3/uL (ref 0.0–0.5)
Eosinophils Relative: 0 %
HCT: 28.7 % — ABNORMAL LOW (ref 36.0–46.0)
Hemoglobin: 10.1 g/dL — ABNORMAL LOW (ref 12.0–15.0)
Immature Granulocytes: 0 %
Lymphocytes Relative: 11 %
Lymphs Abs: 0.6 10*3/uL — ABNORMAL LOW (ref 0.7–4.0)
MCH: 27 pg (ref 26.0–34.0)
MCHC: 35.2 g/dL (ref 30.0–36.0)
MCV: 76.7 fL — ABNORMAL LOW (ref 80.0–100.0)
Monocytes Absolute: 0.2 10*3/uL (ref 0.1–1.0)
Monocytes Relative: 3 %
Neutro Abs: 4.5 10*3/uL (ref 1.7–7.7)
Neutrophils Relative %: 86 %
Platelets: 49 10*3/uL — ABNORMAL LOW (ref 150–400)
RBC: 3.74 MIL/uL — ABNORMAL LOW (ref 3.87–5.11)
RDW: 17.9 % — ABNORMAL HIGH (ref 11.5–15.5)
WBC: 5.2 10*3/uL (ref 4.0–10.5)
nRBC: 0 % (ref 0.0–0.2)

## 2020-08-30 LAB — POCT I-STAT 7, (LYTES, BLD GAS, ICA,H+H)
Acid-Base Excess: 2 mmol/L (ref 0.0–2.0)
Bicarbonate: 25.4 mmol/L (ref 20.0–28.0)
Calcium, Ion: 1.02 mmol/L — ABNORMAL LOW (ref 1.15–1.40)
HCT: 33 % — ABNORMAL LOW (ref 36.0–46.0)
Hemoglobin: 11.2 g/dL — ABNORMAL LOW (ref 12.0–15.0)
O2 Saturation: 99 %
Patient temperature: 98.6
Potassium: 2.8 mmol/L — ABNORMAL LOW (ref 3.5–5.1)
Sodium: 139 mmol/L (ref 135–145)
TCO2: 26 mmol/L (ref 22–32)
pCO2 arterial: 36.1 mmHg (ref 32.0–48.0)
pH, Arterial: 7.455 — ABNORMAL HIGH (ref 7.350–7.450)
pO2, Arterial: 127 mmHg — ABNORMAL HIGH (ref 83.0–108.0)

## 2020-08-30 LAB — BASIC METABOLIC PANEL
Anion gap: 13 (ref 5–15)
BUN: 13 mg/dL (ref 8–23)
CO2: 21 mmol/L — ABNORMAL LOW (ref 22–32)
Calcium: 7.7 mg/dL — ABNORMAL LOW (ref 8.9–10.3)
Chloride: 102 mmol/L (ref 98–111)
Creatinine, Ser: 2.41 mg/dL — ABNORMAL HIGH (ref 0.44–1.00)
GFR, Estimated: 21 mL/min — ABNORMAL LOW (ref >60)
Glucose, Bld: 107 mg/dL — ABNORMAL HIGH (ref 70–99)
Potassium: 3.4 mmol/L — ABNORMAL LOW (ref 3.5–5.1)
Sodium: 136 mmol/L (ref 135–145)

## 2020-08-30 LAB — AMMONIA: Ammonia: 178 umol/L — ABNORMAL HIGH (ref 9–35)

## 2020-08-30 LAB — GLUCOSE, CAPILLARY
Glucose-Capillary: 115 mg/dL — ABNORMAL HIGH (ref 70–99)
Glucose-Capillary: 115 mg/dL — ABNORMAL HIGH (ref 70–99)
Glucose-Capillary: 110 mg/dL — ABNORMAL HIGH (ref 70–99)
Glucose-Capillary: 116 mg/dL — ABNORMAL HIGH (ref 70–99)
Glucose-Capillary: 149 mg/dL — ABNORMAL HIGH (ref 70–99)
Glucose-Capillary: 161 mg/dL — ABNORMAL HIGH (ref 70–99)

## 2020-08-30 LAB — VALPROIC ACID LEVEL: Valproic Acid Lvl: 54 ug/mL (ref 50.0–100.0)

## 2020-08-30 LAB — PROTIME-INR
INR: 1.7 — ABNORMAL HIGH (ref 0.8–1.2)
Prothrombin Time: 19.6 seconds — ABNORMAL HIGH (ref 11.4–15.2)

## 2020-08-30 LAB — HAPTOGLOBIN: Haptoglobin: 12 mg/dL — ABNORMAL LOW (ref 37–355)

## 2020-08-30 LAB — MAGNESIUM: Magnesium: 2 mg/dL (ref 1.7–2.4)

## 2020-08-30 LAB — PHOSPHORUS: Phosphorus: 5.7 mg/dL — ABNORMAL HIGH (ref 2.5–4.6)

## 2020-08-30 MED ORDER — LEVETIRACETAM IN NACL 500 MG/100ML IV SOLN
500.0000 mg | Freq: Two times a day (BID) | INTRAVENOUS | Status: DC
  Administered 2020-08-30 – 2020-09-05 (×12): 500 mg via INTRAVENOUS
  Filled 2020-08-30 (×11): qty 100

## 2020-08-30 MED ORDER — VALPROATE SODIUM 500 MG/5ML IV SOLN
250.0000 mg | Freq: Four times a day (QID) | INTRAVENOUS | Status: DC
  Administered 2020-08-30: 250 mg via INTRAVENOUS
  Filled 2020-08-30 (×2): qty 2.5

## 2020-08-30 MED ORDER — DOCUSATE SODIUM 50 MG/5ML PO LIQD
100.0000 mg | Freq: Two times a day (BID) | ORAL | Status: DC
  Administered 2020-08-30 – 2020-08-31 (×2): 100 mg
  Filled 2020-08-30 (×2): qty 10

## 2020-08-30 MED ORDER — OXCARBAZEPINE 150 MG PO TABS
150.0000 mg | ORAL_TABLET | Freq: Two times a day (BID) | ORAL | Status: DC
  Administered 2020-08-30 – 2020-09-04 (×11): 150 mg
  Filled 2020-08-30 (×12): qty 1

## 2020-08-30 MED ORDER — POTASSIUM CHLORIDE 10 MEQ/50ML IV SOLN
10.0000 meq | INTRAVENOUS | Status: AC
  Administered 2020-08-30 (×4): 10 meq via INTRAVENOUS
  Filled 2020-08-30 (×4): qty 50

## 2020-08-30 MED ORDER — FLUDROCORTISONE ACETATE 0.1 MG PO TABS: 0.1000 mg | ORAL_TABLET | Freq: Every day | ORAL | Status: DC

## 2020-08-30 MED ORDER — SODIUM CHLORIDE 0.9 % IV SOLN
50.0000 mg | Freq: Two times a day (BID) | INTRAVENOUS | Status: DC
  Filled 2020-08-30 (×2): qty 5

## 2020-08-30 MED ORDER — FENTANYL CITRATE (PF) 100 MCG/2ML IJ SOLN
50.0000 ug | INTRAMUSCULAR | Status: DC | PRN
  Administered 2020-08-31 – 2020-09-01 (×4): 100 ug via INTRAVENOUS
  Administered 2020-09-02: 50 ug via INTRAVENOUS
  Administered 2020-09-02 – 2020-09-04 (×2): 100 ug via INTRAVENOUS
  Filled 2020-08-30 (×7): qty 2

## 2020-08-30 MED ORDER — POLYETHYLENE GLYCOL 3350 17 G PO PACK
17.0000 g | PACK | Freq: Every day | ORAL | Status: DC
  Administered 2020-08-31 – 2020-09-01 (×2): 17 g
  Filled 2020-08-30 (×2): qty 1

## 2020-08-30 MED ORDER — HYDROCORTISONE NA SUCCINATE PF 100 MG IJ SOLR
50.0000 mg | Freq: Two times a day (BID) | INTRAMUSCULAR | Status: DC
  Administered 2020-08-30 – 2020-09-01 (×4): 50 mg via INTRAVENOUS
  Filled 2020-08-30 (×4): qty 2

## 2020-08-30 MED ORDER — VITAL HIGH PROTEIN PO LIQD
1000.0000 mL | ORAL | Status: DC
  Administered 2020-08-30: 1000 mL
  Filled 2020-08-30: qty 1000

## 2020-08-30 MED ORDER — NYSTATIN 100000 UNIT/ML MT SUSP
5.0000 mL | Freq: Four times a day (QID) | OROMUCOSAL | Status: DC
  Administered 2020-08-30 – 2020-09-04 (×23): 500000 [IU]
  Filled 2020-08-30 (×21): qty 5

## 2020-08-30 MED ORDER — FENTANYL CITRATE (PF) 100 MCG/2ML IJ SOLN: 50.0000 ug | INTRAMUSCULAR | Status: DC | PRN

## 2020-08-30 NOTE — Progress Notes (Addendum)
NAMEBrooklin [name], MRN:  [id number], DOB:  01-01-1956, LOS: 4 ADMISSION DATE:  08/26/2020, CONSULTATION DATE: 08/26/2020 REFERRING MD: Dr. Lockie Mola, CHIEF COMPLAINT: Hypertension  Brief History   Amber [name] is received in transfer from [facility] in anticipation of continuous EEG monitoring. Briefly she was admitted there on 10/7 complaining of diarrhea and weakness.  She was found to have an elevated lactate and elevated white count.  Stool studies were remarkably unremarkable including a stool guaiac which was negative.  CT scan of the abdomen was remarkable for pancreatic calcifications and thickening of the bowel wall most conspicuously in the ileum.  Blood and urine cultures obtained on admission were negative.  She was transiently supported with alpha agents and placed on stress dose steroids as she is chronically on no home steroids for asthma.  Her lactic acidosis has cleared and she is now off of pressors however on 10/9 she developed drooling and a right eye gaze and was felt to be having partial complex seizures.  Mental  status has not recovered.  CT scan of the head showed an old infarct with no acute changes. There was concern that she was suffering from some clinical status and she was transferred to this hospital for continuous EEG monitoring.  On arrival here she was not protecting her airway and was drooling and she was semiemergently intubated for airway protection.    PMH - CAD, CVA, type 2 diabetes, small bowel obstruction, hypertension, gastroesophageal reflux, seizure.     Past Medical History   Past Medical History:  Diagnosis Date  . ACS (acute coronary syndrome) (HCC) 06/12/2015  . Acute on chronic blood loss anemia 11/25/2017  . Arthritis   . Asthma   . 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 CHF (congestive heart failure) (HCC)   . Chronic pain   . Constipation   . Diabetes mellitus without complication (HCC)   . Duodenitis    . Embolic stroke (HCC)   . Falls frequently   . Focal seizure (HCC)   . GERD (gastroesophageal reflux disease)   . Hypertension   . Impacted fracture 02/15/2017   right glenoid  . Pneumonia 10/2015  . Seizures (HCC)    last seizure March 2015  . Small bowel obstruction (HCC) 11/25/2017     Significant Hospital Events   ICU admission 10/7   Presented to the ER with diarrhea and weakness. Hypotensive in spite of IVFs so PCCM consulted. CT abd w/out clear inflammatory changes. Elevated lactate. Pressors required.   10/8: vanc added. On 2 pressors. Repeat hgb down to 5.9 no clear evidence of bleeding. 2 units of blood ordered. abd more distended. Repeat CT abd/pelvis ordered. Repeat lipase ordered for "stigmata of recurrent pancreatitis" this was negative.   10/9 Received 12.5 mg of fentanyl around midnight, unresponsive around 4 AM Code stroke called, head CT negative. Witnessed seizure by RN and tele neurologist Given Ativan 3 mg in divided doses and loaded with Keppra  10/10 Transfer to [facility] for EEG and neuro evaluation   Consults:  Pulmonary critical care  Procedures:  10/10 ETT   Significant Diagnostic Tests:   CT chest, abdomen and pelvis 10/7:Small bilateral pleural effusion, few bandlike densities, sigmoid diverticulosis loose stool within the colon no obvious inflammatory changes.  Repeat CT abd/pelvis 10/8>>> Sigmoid diverticulosis without inflammation. Calcifications are noted within the pancreas consistent with chronic pancreatitis. The colon is mildly distended and fluid-filled. The terminal leum demonstrates circumferential wall thickening concerning  for enteritis or other inflammation. There is mild dilatation of more proximal small bowel with fecalization concerning for some degree of obstruction. Potentially these findings may represent Crohn's Disease.  10/9 head CT >> Multifocal ischemic infarcts involving the right frontal, parietal, and occipital regions, left  frontal lobe, and cerebellum. Remote lacunar infarcts noted involving the bilateral thalami and left basal ganglia. Appearance is relatively stable.  10/8 renal US >> nml  10/8 Echo>>EF 60-65% without evidence of diastolic failure   Micro Data:  C. difficile 10/7>> neg GI panel panel 10/7>>>neg COVID-19/flu>> negative BC 10/7>> ng   Antimicrobials:  Piperacillin tazobactam 10/7-  Interim history/subjective:   Transferred to [facility], intubated on arrival, stable overnight  Objective   Blood pressure (!) 62/54, pulse 67, temperature (!) 97.4 F (36.3 C), temperature source Axillary, resp. rate (!) 0, height 5' (1.524 m), weight 55.4 kg, SpO2 100 %.    Vent Mode: PRVC FiO2 (%):  [40 %-100 %] 40 % Set Rate:  [16 bmp] 16 bmp Vt Set:  [360 mL] 360 mL PEEP:  [5 cmH20] 5 cmH20 Plateau Pressure:  [28 cmH20-31 cmH20] 28 cmH20   Intake/Output Summary (Last 24 hours) at 08/30/2020 3817 Last data filed at 08/30/2020 0600 Gross per 24 hour  Intake 955.44 ml  Output 375 ml  Net 580.44 ml   Filed Weights   08/27/20 0316 08/28/20 0414 08/29/20 0500  Weight: 55.5 kg 55.5 kg 55.4 kg    Examination:  General:  Sedated, intubated F, intubated on vent HEENT: MM pink/moist, sclera non-icteric Neuro: on propofol , not following commands, no purposeful movement, pupils 24mm and responsive to light bilaterally, +gag, withdraws to pain bilateral LE CV: s1s2 rrr, no m/r/g PULM:  CTAB, ETT in place on full vent support GI: belly distended Extremities: warm/dry, 2+ edema  Skin: no rashes or lesions   Resolved Hospital Problem list     Assessment & Plan:  64 y.o. F who presented with diarrhea, weakness and possible sepsis who had seizure-like activity and shock. No clear source of sepsis, now off pressors. No acute CVA on CT.  Transferred to [facility] for Neuro eval and EEG.      Complex partial seizure, known seizure disorder   On Keppra and Vimpat at baseline, required intubation as not  protecting her airway after transfer -Management per Neuro, continue video EEG, reading last night with evidence of severe diffuse encephalopathy with periodic epileptiform discharges -Continue Valproate and Keppra -seizure precautions  -continue propofol for sedation --Maintain full vent support with SAT/SBT as tolerated -titrate Vent setting to maintain SpO2 greater than or equal to 90%. -HOB elevated 30 degrees. -Plateau pressures less than 30 cm H20.  -Follow chest x-ray, ABG prn.   -Bronchial hygiene and RT/bronchodilator protocol.    Acute metabolic encephalopathy  Superimposed on prior CVA likely contributing to expressive aphasia, Head CT negative -Attempt weaning sedation today, serial neuro exams -Continue Keppra and valproate. -Consider MRI   Shock w/ lactic acidosis. Lactic acid peaked on 7.5 on 10/7, downtrend to 2.3 the next day, Now off pressors, unclear source of infection, initially presented with diarrhea and concern for enteritis vs crohn's on CT. Now with formed stools and negative C. Diff and GI panel -Complete course of Zosyn -on Prednisone 5mg  daily, possibly for asthma, begin weaning stress dose steroids today     AGMA Appears to be both anion and non-anion gap related. 2/2 lactic acidosis c/b NaCl resuscitation and bicarb loss from diarrhea. Lactic acidosis resolved. Now suspect  due to continued renal failure -follow serial BMET's, replace electrolytes as needed    AKI  Possibly secondary to ATN in the setting of shock, UOP 0.37ml/kg last shift, creatinine stable -continue to monitor for renal recovery, follow electrolytes and UOP -normal renal US   Chronic diarrhea CT suggests chronic pancreatitis/recurrent enteritis on CT, Prior EGD 2019 as shown duodenal ulcers, Prior colonoscopy 2019 showed sigmoid diverticulosis -Appreciate GI input, once stabilized would benefit from colonoscopy, recommend solumedrol 40mg  daily   Coagulopathy,  Thrombocytopenia Was on apixaban for afib, Got vitamin K 10/7 -Holding apixiban in the setting of Hgb drop -serial INR, improved today from 4.3 to 1.7 -thrombocytopenia appears to be chronic with platelets 40-100k at least 6 months prior.  GI mentions possible cirrhosis which could be contributing.  Minimal ascites on CT abd and AST 74, ALT 18 with bili of 2.6    Anemia of unclear etiology- Looks like baseline around 7.4 to 8, down to 5.9-->2U PRBC --> 9.8 No evidence of hemolysis, attributed to chronic disease, teardrop cells on CBC diff -Haptoglobin pending, continue to monitor Hgb and for any sign of bleeding.  May benefit from outpatient hematology consult  Hyperglycemia  Plan -SSI -start trickle feeds    Best practice:  Diet: npo Pain/Anxiety/Delirium protocol (if indicated): Propofol VAP protocol (if indicated): HOB 30 degrees, suction prn DVT prophylaxis: SCD's GI prophylaxis: protonix Glucose control: SSI Mobility: Bedrest Code Status: Full Family Communication: Pt's son updated Disposition: ICU >> transfer to [facility] ICU when bed available   CRITICAL CARE Performed by: [facility] Kiondra Caicedo   Total critical care time: 40 minutes  Critical care time was exclusive of separately billable procedures and treating other patients.  Critical care was necessary to treat or prevent imminent or life-threatening deterioration.  Critical care was time spent personally by me on the following activities: development of treatment plan with patient and/or surrogate as well as nursing, discussions with consultants, evaluation of patient's response to treatment, examination of patient, obtaining history from patient or surrogate, ordering and performing treatments and interventions, ordering and review of laboratory studies, ordering and review of radiographic studies, pulse oximetry and re-evaluation of patient's condition.    Darcella Gasman Antonea Gaut, PA-C [place] [facility]  Pager# [phone],  if no answer [phone]

## 2020-08-30 NOTE — Progress Notes (Signed)
LTM maint complete - no skin breakdown under:   O1, O2, P3, P4 fp1 and a1

## 2020-08-30 NOTE — Progress Notes (Addendum)
[facility] Progress Note  Amber [name] 64 y.o. [birth year]  CC: Abnormal CT scan, possible inflammatory bowel disease   Subjective: Patient seen and examined at bedside.  She remains intubated.  Discussed with RN at bedside.  Currently having formed stool.  No evidence of GI bleeding  ROS : Not able to obtain  Objective: Vital signs in last 24 hours: Vitals:   08/30/20 0800 08/30/20 0900  BP: (!) 95/49   Pulse: 81 87  Resp: (!) 0 15  Temp: (!) 94.8 F (34.9 C)   SpO2: 100% 100%    Physical Exam:  General:   Intubated  Head:  Normocephalic, without obvious abnormality, atraumatic  Eyes:   Not able to examine  Lungs:    Coarse breath sounds bilaterally, anterior exam only  Heart:  Regular rate and rhythm, S1, S2 normal  Abdomen:    Mild distended abdomen, bowel sounds present, no peritoneal signs.          Lab Results: Recent Labs    08/28/20 1752 08/29/20 0559 08/29/20 1147 08/29/20 1652 08/30/20 0322 08/30/20 0554  NA   < >  --  141   < > 139 138  K   < >  --  3.1*   < > 2.8* 2.6*  CL   < >  --  101  --   --  101  CO2   < >  --  24  --   --  21*  GLUCOSE   < >  --  123*  --   --  114*  BUN   < >  --  15  --   --  14  CREATININE   < >  --  2.45*  --   --  2.46*  CALCIUM   < >  --  7.2*  --   --  7.5*  MG  --  2.0  --   --   --  2.0  PHOS  --  4.7*  --   --   --  5.7*   < > = values in this interval not displayed.   Recent Labs    08/28/20 0408 08/30/20 0554  AST 51* 74*  ALT 13 18  ALKPHOS 217* 446*  BILITOT 2.9* 2.6*  PROT 5.2* 5.1*  ALBUMIN 2.4* 2.0*   Recent Labs    08/29/20 0559 08/29/20 1652 08/30/20 0322 08/30/20 0554  WBC 7.1  --   --  5.2  NEUTROABS  --   --   --  4.5  HGB 9.8*   < > 11.2* 10.1*  HCT 27.6*   < > 33.0* 28.7*  MCV 76.9*  --   --  76.7*  PLT 59*  --   --  49*   < > = values in this interval not displayed.   Recent Labs    08/30/20 0554  LABPROT 19.6*  INR 1.7*      Assessment/Plan: -Abnormal  CT scan concerning for inflammatory bowel disease.  CT abdomen pelvis without contrast on August 15, 2020 showed mild inflammation in the ascending, transverse and descending colon.  On August 26, 2020 showed loose stool throughout the colon without any inflammation.  Another CT scan August 28, 2020 showed circumferential wall thickening of the terminal ileum. -Occult blood negative.  Normal ESR.  - Cirrhosis of the liver -Severe diffuse encephalopathy.  Recommendations ------------------------- -Negative GI pathogen panel and C. difficile.  Currently having formed stool.  Normal ESR.  CRP pending. -  Given negative CT scan few months ago, patient less likely has chronic inflammatory bowel disease but remains in differential. -Okay to reduce steroids from GI standpoint to every 12 hours. -GI will follow periodically    Otis Brace MD, [place] 08/30/2020, 10:21 AM  Contact #  [phone]

## 2020-08-30 NOTE — Progress Notes (Signed)
eLink Physician-Brief Progress Note Patient Name: Amber [name] DOB: Nov 19, 1956 MRN: [id number]   Date of Service  08/30/2020  HPI/Events of Note  Hypokalemia - K+ = 2.6 and Creatinine = 2.46.   eICU Interventions  Plan: 1. Replace K+. 2. Repeat BMP at 1 PM.      Intervention Category Major Interventions: Electrolyte abnormality - evaluation and management  Toris Laverdiere [name] 08/30/2020, 6:57 AM

## 2020-08-30 NOTE — Progress Notes (Addendum)
Subjective: No clinical seizures overnight.  ROS: Unable to obtain due to poor mental status  Examination  Vital signs in last 24 hours: Temp:  [94.8 F (34.9 C)-98.3 F (36.8 C)] 97.5 F (36.4 C) (10/11 1200) Pulse Rate:  [67-105] 94 (10/11 1119) Resp:  [0-40] 20 (10/11 1119) BP: (50-134)/(24-80) 95/49 (10/11 0800) SpO2:  [97 %-100 %] 100 % (10/11 1119) Arterial Line BP: (100-142)/(66-88) 101/66 (10/11 1100) FiO2 (%):  [30 %-100 %] 30 % (10/11 1119)  General: lying in bed, not in apparent distress CVS: pulse-normal rate and rhythm RS: breathing comfortably, intubated Extremities: normal, warm Neuro: Opens eyes to noxious stimuli, did not follow commands, PERRLA, corneal reflex intact, gag reflex intact, withdraws to noxious stimuli in all 4 extremities  Basic Metabolic Panel: Recent Labs  Lab 08/27/20 0234 08/27/20 0914 08/28/20 0408 08/28/20 0408 08/28/20 1752 08/28/20 1752 08/29/20 0559 08/29/20 1147 08/29/20 1652 08/30/20 0322 08/30/20 0554 08/30/20 1255  NA 140   < > 135   < > 141   < >  --  141 139 139 138 136  K 3.5   < > 3.5   < > 3.2*   < >  --  3.1* 3.0* 2.8* 2.6* 3.4*  CL 112*   < > 104  --  104  --   --  101  --   --  101 102  CO2 12*   < > 19*  --  22  --   --  24  --   --  21* 21*  GLUCOSE 151*   < > 206*  --  139*  --   --  123*  --   --  114* 107*  BUN 10   < > 12  --  13  --   --  15  --   --  14 13  CREATININE 2.00*   < > 2.23*  --  2.30*  --   --  2.45*  --   --  2.46* 2.41*  CALCIUM 6.3*   < > 6.3*   < > 6.8*   < >  --  7.2*  --   --  7.5* 7.7*  MG 1.2*  --   --   --   --   --  2.0  --   --   --  2.0  --   PHOS 4.6  --   --   --   --   --  4.7*  --   --   --  5.7*  --    < > = values in this interval not displayed.    CBC: Recent Labs  Lab 08/26/20 1750 08/26/20 1750 08/27/20 0234 08/27/20 0234 08/27/20 1112 08/27/20 1112 08/28/20 0408 08/29/20 0559 08/29/20 1652 08/30/20 0322 08/30/20 0554  WBC 12.0*   < > 8.6  --  7.6  --  9.3  7.1  --   --  5.2  NEUTROABS 7.6  --   --   --   --   --   --   --   --   --  4.5  HGB 8.1*   < > 5.9*   < > 9.8*   < > 10.1* 9.8* 10.2* 11.2* 10.1*  HCT 23.7*   < > 17.8*   < > 28.4*   < > 28.7* 27.6* 30.0* 33.0* 28.7*  MCV 78.0*   < > 81.3  --  79.8*  --  77.6* 76.9*  --   --  76.7*  PLT 136*   < > 91*  --  89*  --  86* 59*  --   --  49*   < > = values in this interval not displayed.     Coagulation Studies: Recent Labs    08/30/20 0554  LABPROT 19.6*  INR 1.7*    Imaging CT head without contrast 08/28/2020: No acute intracranial abnormality. 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 Remote lacunar infarcts noted involving the bilateral thalami and left basal ganglia.  ASSESSMENT AND PLAN: 64 year old female with chronic infarcts and epilepsy who was admitted for sepsis and was noted to have focal seizures.  Chronic infarcts Epilepsy with breakthrough seizures Acute encephalopathy, likely postictal, toxic metabolic, infectious Cirrhosis Transaminitis Hypokalemia AKI Anemia Thrombocytopenia -Valproic acid was increased overnight with no significant change in EEG. -Given patient's multiple comorbidities including cirrhosis, worsening renal function and sepsis, the EEG findings could possibly be related to this leading to cortical irritability.  Recommendations -Discontinue propofol as patient has not had any further seizures -Due to cirrhosis and thrombocytopenia and prior history of hyperammonemia, will discontinue valproic acid.  We will also check ammonia levels -We will reduce Keppra to 500 mg twice daily due to poor renal function - Daughter reports hallucinations on vimpat in past. Therefore, We will switch patient to Oxcarb 150 mg twice daily ( no adjustment needed for liver dysfunction) -Continue LTM EEG, if no seizures tomorrow will discontinue -Seizure precautions -As needed IV  Ativan 2 mg for clinical seizure-like activity -MRI brain without contrast after discontinuing LTM EEG to look for any acute abnormalities -Management of rest of comorbidities per primary team  CRITICAL CARE Performed by: Charlsie Quest   Total critical care time: 35 minutes  Critical care time was exclusive of separately billable procedures and treating other patients.  Critical care was necessary to treat or prevent imminent or life-threatening deterioration.  Critical care was time spent personally by me on the following activities: development of treatment plan with patient and/or surrogate as well as nursing, discussions with consultants, evaluation of patient's response to treatment, examination of patient, obtaining history from patient or surrogate, ordering and performing treatments and interventions, ordering and review of laboratory studies, ordering and review of radiographic studies, pulse oximetry and re-evaluation of patient's condition.  [name] Epilepsy [facility] For questions after 5pm please refer to AMION to reach the Neurologist on call

## 2020-08-30 NOTE — Procedures (Addendum)
Patient Name: Amber [name]  MRN: [id number]  Epilepsy Attending: Charlsie Quest  Referring Physician/Provider: Dr Juanetta Snow Duration: 08/29/2020 1840 to 08/30/2020 1840  Patient history: 64yo F with h/o epilepsy presented with seizure like activity. EEG to evaluate for seizure.   Level of alertness: Lethargic  AEDs during EEG study: LEV, VPA, Propofol  Technical aspects: This EEG study was done with scalp electrodes positioned according to the 10-20 International system of electrode placement. Electrical activity was acquired at a sampling rate of 500Hz  and reviewed with a high frequency filter of 70Hz  and a low frequency filter of 1Hz . EEG data were recorded continuously and digitally stored.   Description:   EEG showed continuous generalized 3 to 6 Hz theta-delta slowing. Generalized periodic epileptiform discharges with triphasic morphology at 1-1.5hz  were also noted intermittently. Hyperventilation and photic stimulation were not performed.     ABNORMALITY -Continuous slow, generalized -Periodic epileptiform discharges with triphasic morphology, generalized  IMPRESSION: This study showed evidence of generalized epileptogenicity as well as severe diffuse encephalopathy. The periodic epileptiform discharges with triphasic morphology are on the ictal-interictal continuum with low potential for seizures. Thee discharges can also be seen due to hyperammonemia, uremia, cefepime toxicity. No seizures were seen during this study.    Ryosuke Ericksen 

## 2020-08-31 ENCOUNTER — Inpatient Hospital Stay (HOSPITAL_COMMUNITY): Payer: Medicaid Other

## 2020-08-31 DIAGNOSIS — R569 Unspecified convulsions: Secondary | ICD-10-CM | POA: Diagnosis not present

## 2020-08-31 DIAGNOSIS — R579 Shock, unspecified: Secondary | ICD-10-CM | POA: Diagnosis not present

## 2020-08-31 LAB — BASIC METABOLIC PANEL
Anion gap: 16 — ABNORMAL HIGH (ref 5–15)
BUN: 18 mg/dL (ref 8–23)
CO2: 21 mmol/L — ABNORMAL LOW (ref 22–32)
Calcium: 8.1 mg/dL — ABNORMAL LOW (ref 8.9–10.3)
Chloride: 101 mmol/L (ref 98–111)
Creatinine, Ser: 2.39 mg/dL — ABNORMAL HIGH (ref 0.44–1.00)
GFR, Estimated: 21 mL/min — ABNORMAL LOW (ref >60)
Glucose, Bld: 154 mg/dL — ABNORMAL HIGH (ref 70–99)
Potassium: 2.8 mmol/L — ABNORMAL LOW (ref 3.5–5.1)
Sodium: 138 mmol/L (ref 135–145)

## 2020-08-31 LAB — GLUCOSE, CAPILLARY
Glucose-Capillary: 157 mg/dL — ABNORMAL HIGH (ref 70–99)
Glucose-Capillary: 137 mg/dL — ABNORMAL HIGH (ref 70–99)
Glucose-Capillary: 142 mg/dL — ABNORMAL HIGH (ref 70–99)
Glucose-Capillary: 152 mg/dL — ABNORMAL HIGH (ref 70–99)
Glucose-Capillary: 184 mg/dL — ABNORMAL HIGH (ref 70–99)
Glucose-Capillary: 155 mg/dL — ABNORMAL HIGH (ref 70–99)

## 2020-08-31 LAB — PHOSPHORUS: Phosphorus: 5.1 mg/dL — ABNORMAL HIGH (ref 2.5–4.6)

## 2020-08-31 LAB — CBC
HCT: 30.3 % — ABNORMAL LOW (ref 36.0–46.0)
Hemoglobin: 10.5 g/dL — ABNORMAL LOW (ref 12.0–15.0)
MCH: 26.9 pg (ref 26.0–34.0)
MCHC: 34.7 g/dL (ref 30.0–36.0)
MCV: 77.7 fL — ABNORMAL LOW (ref 80.0–100.0)
Platelets: 54 10*3/uL — ABNORMAL LOW (ref 150–400)
RBC: 3.9 MIL/uL (ref 3.87–5.11)
RDW: 17.8 % — ABNORMAL HIGH (ref 11.5–15.5)
WBC: 6.9 10*3/uL (ref 4.0–10.5)
nRBC: 0 % (ref 0.0–0.2)

## 2020-08-31 LAB — CULTURE, BLOOD (ROUTINE X 2)
Culture: NO GROWTH
Culture: NO GROWTH
Special Requests: ADEQUATE

## 2020-08-31 LAB — HIGH SENSITIVITY CRP: CRP, High Sensitivity: 62.87 mg/L — ABNORMAL HIGH (ref 0.00–3.00)

## 2020-08-31 LAB — PROTIME-INR
INR: 1.6 — ABNORMAL HIGH (ref 0.8–1.2)
Prothrombin Time: 18.1 seconds — ABNORMAL HIGH (ref 11.4–15.2)

## 2020-08-31 LAB — CALCIUM, IONIZED: Calcium, Ionized, Serum: 4.4 mg/dL — ABNORMAL LOW (ref 4.5–5.6)

## 2020-08-31 LAB — AMMONIA: Ammonia: 195 umol/L — ABNORMAL HIGH (ref 9–35)

## 2020-08-31 LAB — MAGNESIUM: Magnesium: 1.9 mg/dL (ref 1.7–2.4)

## 2020-08-31 MED ORDER — PANTOPRAZOLE SODIUM 40 MG IV SOLR
40.0000 mg | INTRAVENOUS | Status: DC
  Administered 2020-08-31 – 2020-09-01 (×2): 40 mg via INTRAVENOUS
  Filled 2020-08-31 (×2): qty 40

## 2020-08-31 MED ORDER — LACTULOSE 10 GM/15ML PO SOLN
30.0000 g | Freq: Three times a day (TID) | ORAL | Status: AC
  Administered 2020-08-31 – 2020-09-01 (×6): 30 g
  Filled 2020-08-31 (×5): qty 45

## 2020-08-31 MED ORDER — METOPROLOL TARTRATE 25 MG/10 ML ORAL SUSPENSION
12.5000 mg | Freq: Two times a day (BID) | ORAL | Status: DC
  Administered 2020-08-31 – 2020-09-04 (×10): 12.5 mg
  Filled 2020-08-31 (×9): qty 10

## 2020-08-31 MED ORDER — AMLODIPINE BESYLATE 5 MG PO TABS
5.0000 mg | ORAL_TABLET | Freq: Every day | ORAL | Status: DC
  Administered 2020-08-31: 5 mg

## 2020-08-31 MED ORDER — LABETALOL HCL 5 MG/ML IV SOLN
5.0000 mg | INTRAVENOUS | Status: DC | PRN
  Administered 2020-08-31 – 2020-09-01 (×2): 5 mg via INTRAVENOUS
  Filled 2020-08-31: qty 4

## 2020-08-31 MED ORDER — ALBUMIN HUMAN 5 % IV SOLN
25.0000 g | Freq: Once | INTRAVENOUS | Status: AC
  Administered 2020-08-31: 25 g via INTRAVENOUS
  Filled 2020-08-31: qty 500

## 2020-08-31 MED ORDER — LACTULOSE 10 GM/15ML PO SOLN
30.0000 g | Freq: Three times a day (TID) | ORAL | Status: DC
  Filled 2020-08-31: qty 45

## 2020-08-31 MED ORDER — POTASSIUM CHLORIDE 20 MEQ PO PACK
20.0000 meq | PACK | Freq: Once | ORAL | Status: DC
  Filled 2020-08-31: qty 1

## 2020-08-31 MED ORDER — FUROSEMIDE 10 MG/ML IJ SOLN
20.0000 mg | Freq: Once | INTRAMUSCULAR | Status: AC
  Administered 2020-08-31: 20 mg via INTRAVENOUS
  Filled 2020-08-31: qty 2

## 2020-08-31 MED ORDER — AMLODIPINE BESYLATE 5 MG PO TABS
5.0000 mg | ORAL_TABLET | Freq: Every day | ORAL | Status: DC
  Filled 2020-08-31: qty 1

## 2020-08-31 MED ORDER — POLYETHYLENE GLYCOL 3350 17 G PO PACK: 17.0000 g | PACK | Freq: Every day | ORAL | Status: DC | PRN

## 2020-08-31 MED ORDER — POTASSIUM CHLORIDE 20 MEQ PO PACK
20.0000 meq | PACK | Freq: Once | ORAL | Status: AC
  Administered 2020-08-31: 20 meq

## 2020-08-31 MED ORDER — POTASSIUM CHLORIDE 10 MEQ/100ML IV SOLN
10.0000 meq | INTRAVENOUS | Status: AC
  Administered 2020-08-31 (×2): 10 meq via INTRAVENOUS
  Filled 2020-08-31 (×2): qty 100

## 2020-08-31 MED ORDER — VITAL AF 1.2 CAL PO LIQD
1000.0000 mL | ORAL | Status: DC
  Administered 2020-08-31 – 2020-09-03 (×4): 1000 mL
  Filled 2020-08-31 (×3): qty 1000

## 2020-08-31 MED ORDER — METOPROLOL SUCCINATE ER 25 MG PO TB24
25.0000 mg | ORAL_TABLET | Freq: Every day | ORAL | Status: DC
  Filled 2020-08-31: qty 1

## 2020-08-31 NOTE — Progress Notes (Signed)
Initial Nutrition Assessment  DOCUMENTATION CODES:   Non-severe (moderate) malnutrition in context of chronic illness  INTERVENTION:   Continue tube feeding: - Change to Vital AF 1.2 @ 45 ml/hr (1080 ml/day) via OG tube  Tube feeding regimen provides 1296 kcal, 81 grams of protein, and 876 ml of H2O.   NUTRITION DIAGNOSIS:   Moderate Malnutrition related to chronic illness (CHF) as evidenced by mild fat depletion, moderate muscle depletion, mild muscle depletion.  GOAL:   Patient will meet greater than or equal to 90% of their needs  MONITOR:   Vent status, Labs, Weight trends, TF tolerance, Skin, I & O's  REASON FOR ASSESSMENT:   Ventilator, Consult Enteral/tube feeding initiation and management  ASSESSMENT:   64 year old female who presented to the ED on 10/07 with AMS. PMH of CAD, CVA, T2DM, SBO, HTN, GERD, seizures, CHF. Pt admitted with sepsis. CT abdomen/pelvis showing enteritis.   10/10 - transferred to [facility] for EEG, intubated  Per notes, pt with no further seizures on EEG, propofol discontinued. Mental status precludes extubation.  OG tube with tip in distal stomach per abdominal x-ray, currently with Vital High Protein infusing at 20 ml/hr. Discussed with CCM, okay to advance pt past trickle tube feeds.  Discussed pt with RN. Pt tolerating trickle tube feeds without issue.  Pt with +4 pitting edema to BUE and BLE. Difficult to determine dry weight at this time. Pt with weight of 51 kg at encounter on 08/19/20. Will utilize this as EDW.  Patient is currently intubated on ventilator support MV: 10.1 L/min Temp (24hrs), Avg:98 F (36.7 C), Min:97.7 F (36.5 C), Max:98.3 F (36.8 C)  Medications reviewed and include: colace, solu-cortef, SSI q 4 hours, lactulose, protonix, miralax, IV abx  Labs reviewed: potassium 2.8, creatinine 2.39, phosphorus 5.1 CBG's: 116-161 x 24 hours  UOP: 705 ml x 24 hours I/O's: +8.7 L since admit  NUTRITION - FOCUSED PHYSICAL  EXAM:    Most Recent Value  Orbital Region Mild depletion  Upper Arm Region Mild depletion  Thoracic and Lumbar Region Mild depletion  Buccal Region Unable to assess  Temple Region Mild depletion  Clavicle Bone Region Mild depletion  Clavicle and Acromion Bone Region Moderate depletion  Scapular Bone Region Unable to assess  Dorsal Hand Mild depletion  Patellar Region Mild depletion  Anterior Thigh Region Moderate depletion  Posterior Calf Region Mild depletion  Edema (RD Assessment) Severe  [BUE, BLE]  Hair Reviewed  Eyes Unable to assess  Mouth Unable to assess  Skin Reviewed  Nails Reviewed       Diet Order:   Diet Order            Diet NPO time specified  Diet effective now                 EDUCATION NEEDS:   No education needs have been identified at this time  Skin:  Skin Assessment: Skin Integrity Issues: Stage I: left abdomen Other: MASD to perineum, moisture-associated wound to right groin  Last BM:  08/31/20  Height:   Ht Readings from Last 1 Encounters:  08/29/20 5' (1.524 m)    Weight:   Wt Readings from Last 1 Encounters:  08/29/20 55.4 kg    BMI:  Body mass index is 23.85 kg/m.  Estimated Nutritional Needs:   Kcal:  1278  Protein:  75-90 grams  Fluid:  1.5 L/day    Earma Reading, MS, RD, LDN Inpatient Clinical Dietitian Please see AMiON for contact  information.

## 2020-08-31 NOTE — Progress Notes (Signed)
eLink Physician-Brief Progress Note Patient Name: Amber [name] DOB: 03/31/1956 MRN: [id number]   Date of Service  08/31/2020  HPI/Events of Note  Patient with diarrhea and early skin break down.  eICU Interventions  Flexiseal ordered.        Thomasene Lot Jossette Zirbel 08/31/2020, 10:27 PM

## 2020-08-31 NOTE — Procedures (Addendum)
Patient Name:Amber [name] BFX:[id number] Epilepsy Attending:Brilee Port Annabelle Harman Referring Physician/Provider:Dr Juanetta Snow Duration:08/30/2020 1840 to 08/31/2020 1148  Patient history:64yo F with h/o epilepsy presented with seizure like activity. EEG to evaluate for seizure.  Level of alertness:Lethargic  AEDs during EEG study:LEV, VPA, Propofol  Technical aspects: This EEG study was done with scalp electrodes positioned according to the 10-20 International system of electrode placement. Electrical activity was acquired at a sampling rate of 500Hz  and reviewed with a high frequency filter of 70Hz  and a low frequency filter of 1Hz . EEG data were recorded continuously and digitally stored.   Description: EEG showed continuous generalized 3 to 6 Hz theta-delta slowing. Generalized periodic epileptiform discharges with triphasic morphology at 1-1.5hz  were also noted intermittently.Hyperventilation and photic stimulation were not performed.  Event button was pressed at 0230 on 08/31/2020 for unclear reason. Concomitant eeg before, during and after the event didn't show eeg change to suggest seizure.  ABNORMALITY -Continuousslow, generalized -Periodic epileptiform discharges with triphasic morphology, generalized  IMPRESSION: This studyshowed evidence of generalized epileptogenicity as well as severe diffuse encephalopathy. The periodic epileptiform discharges with triphasic morphology are on the ictal-interictal continuum with low potential for seizures. These discharges can also be seen due to hyperammonemia. No seizures were seen during this study.  EEG appears to be stable compared to yesterday.  Don Giarrusso 

## 2020-08-31 NOTE — Progress Notes (Addendum)
[name], MRN:  [id number], DOB:  [birth year], LOS: 5 ADMISSION DATE:  08/26/2020, CONSULTATION DATE: 08/26/2020 REFERRING MD: Dr. Lockie Mola, CHIEF COMPLAINT: Hypertension  Brief History   Amber [name] is received in transfer from [facility] in anticipation of continuous EEG monitoring. Briefly she was admitted there on 10/7 complaining of diarrhea and weakness.  She was found to have an elevated lactate and elevated white count.  Stool studies were remarkably unremarkable including a stool guaiac which was negative.  CT scan of the abdomen was remarkable for pancreatic calcifications and thickening of the bowel wall most conspicuously in the ileum.  Blood and urine cultures obtained on admission were negative.  She was transiently supported with alpha agents and placed on stress dose steroids as she is chronically on no home steroids for asthma.  Her lactic acidosis has cleared and she is now off of pressors however on 10/9 she developed drooling and a right eye gaze and was felt to be having partial complex seizures.  Mental  status has not recovered.  CT scan of the head showed an old infarct with no acute changes. There was concern that she was suffering from some clinical status and she was transferred to this hospital for continuous EEG monitoring.  On arrival here she was not protecting her airway and was drooling and she was semiemergently intubated for airway protection.    PMH - CAD, CVA, type 2 diabetes, small bowel obstruction, hypertension, gastroesophageal reflux, seizure.     Past Medical History   Past Medical History:  Diagnosis Date  . ACS (acute coronary syndrome) (HCC) 06/12/2015  . Acute on chronic blood loss anemia 11/25/2017  . Arthritis   . Asthma   . 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 CHF (congestive heart failure) (HCC)   . Chronic pain   . Constipation   . Diabetes mellitus without complication (HCC)   . Duodenitis    . Embolic stroke (HCC)   . Falls frequently   . Focal seizure (HCC)   . GERD (gastroesophageal reflux disease)   . Hypertension   . Impacted fracture 02/15/2017   right glenoid  . Pneumonia 10/2015  . Seizures (HCC)    last seizure March 2015  . Small bowel obstruction (HCC) 11/25/2017     Significant Hospital Events   ICU admission 10/7   Presented to the ER with diarrhea and weakness. Hypotensive in spite of IVFs so PCCM consulted. CT abd w/out clear inflammatory changes. Elevated lactate. Pressors required.   10/8: vanc added. On 2 pressors. Repeat hgb down to 5.9 no clear evidence of bleeding. 2 units of blood ordered. abd more distended. Repeat CT abd/pelvis ordered. Repeat lipase ordered for "stigmata of recurrent pancreatitis" this was negative.   10/9 Received 12.5 mg of fentanyl around midnight, unresponsive around 4 AM Code stroke called, head CT negative. Witnessed seizure by RN and tele neurologist Given Ativan 3 mg in divided doses and loaded with Keppra  10/10 Transfer to [facility] for EEG and neuro evaluation   Consults:  Pulmonary critical care  Procedures:  10/10 ETT   Significant Diagnostic Tests:   CT chest, abdomen and pelvis 10/7:Small bilateral pleural effusion, few bandlike densities, sigmoid diverticulosis loose stool within the colon no obvious inflammatory changes.  Repeat CT abd/pelvis 10/8>>> Sigmoid diverticulosis without inflammation. Calcifications are noted within the pancreas consistent with chronic pancreatitis. The colon is mildly distended and fluid-filled. The terminal leum demonstrates circumferential wall thickening concerning  for enteritis or other inflammation. There is mild dilatation of more proximal small bowel with fecalization concerning for some degree of obstruction. Potentially these findings may represent Crohn's Disease.  10/9 head CT >> Multifocal ischemic infarcts involving the right frontal, parietal, and occipital regions, left  frontal lobe, and cerebellum. Remote lacunar infarcts noted involving the bilateral thalami and left basal ganglia. Appearance is relatively stable.  10/8 renal US >> nml  10/8 Echo>>EF 60-65% without evidence of diastolic failure  10/11 EEG>>generalized epileptogenicity as well as severe diffuse encephalopathy. The periodic epileptiform discharges with triphasic morphology are on the ictal-interictal continuum with low potential for seizures. Thee discharges can also be seen due to hyperammonemia, uremia, cefepime toxicity. No seizures were seen during this study.  Micro Data:  C. difficile 10/7>> neg GI panel panel 10/7>>>neg COVID-19/flu>> negative BC 10/7>> ng   Antimicrobials:  Piperacillin tazobactam 10/7-  Interim history/subjective:  No overnight events  Objective   Blood pressure (!) 142/83, pulse (!) 111, temperature 98 F (36.7 C), temperature source Axillary, resp. rate (!) 21, height 5' (1.524 m), weight 55.4 kg, SpO2 100 %.    Vent Mode: PRVC FiO2 (%):  [30 %] 30 % Set Rate:  [16 bmp] 16 bmp Vt Set:  [360 mL] 360 mL PEEP:  [5 cmH20] 5 cmH20 Plateau Pressure:  [25 cmH20-32 cmH20] 26 cmH20   Intake/Output Summary (Last 24 hours) at 08/31/2020 0741 Last data filed at 08/31/2020 0600 Gross per 24 hour  Intake 874.1 ml  Output 706 ml  Net 168.1 ml   Filed Weights   08/27/20 0316 08/28/20 0414 08/29/20 0500  Weight: 55.5 kg 55.5 kg 55.4 kg    Examination:  General:  Elderly, critically ill F. Resting comfortably HEENT: MM pink/moist, ETT in place Neuro: off propofol, grimacing to pain, opens eyes slightly to voice, slightly moving extremities to command,  +gag, triggering vent,  CV: s1s2 tachycardic, no m/r/g PULM:  On full vent support, lungs clear, no rhonchi or wheezing GI: soft, bsx4 active , tight  Extremities: warm/dry,  2+ edema  Skin: no rashes or lesions   Resolved Hospital Problem list     Assessment & Plan:  64 y.o. F who presented with  diarrhea, weakness and possible sepsis who had seizure-like activity and shock. No clear source of sepsis, now off pressors. No acute CVA on CT.  Transferred to [facility] for Neuro eval and EEG.      Complex partial seizure, known seizure disorder   No further seizures on EEG, propofol discontinued, required mechanical ventilation for AMS -Appreciate neuro recommendations, d/c valproic acid secondary to cirrhosis and thrombocytopenia, Keppra 500mg  bid with Oxcarb 150mg  bid -Continue LTM EEG today and d/c 10/13 if no further seizures -MRI brain after EEG completed -continue seizure precautions -Maintain full vent support with SAT/SBT as tolerated -titrate Vent setting to maintain SpO2 greater than or equal to 90%. -HOB elevated 30 degrees. -Plateau pressures less than 30 cm H20.  -Follow chest x-ray, ABG prn.   -Bronchial hygiene and RT/bronchodilator protocol.     Acute metabolic encephalopathy  Superimposed on prior CVA likely contributing to expressive aphasia along with hepatic encephalopathy.  Head CT negative -More awake today and opening eyes off propofol, continue prn Fentanyl -Ammonia 195 today, start lactulose   Shock w/ lactic acidosis. Lactic acid peaked on 7.5 on 10/7, downtrend to 2.3 the next day, Now off pressors, unclear source of infection, initially presented with diarrhea and concern for enteritis vs crohn's on CT. Now  with formed stools and negative C. Diff and GI panel -Complete course of Zosyn -on Prednisone 5mg  daily, possibly for asthma, weaning stress dose steroids to solucortef 50mg  bid     AGMA Appears to be both anion and non-anion gap related. 2/2 lactic acidosis c/b NaCl resuscitation and bicarb loss from diarrhea. Lactic acidosis resolved. Now suspect due to continued renal failure -follow serial BMET's, replace electrolytes as needed    AKI  Possibly secondary to ATN in the setting of shock, UOP 0.2ml/kg last shift, creatinine stable -continue to  monitor for renal recovery, follow electrolytes and UOP -Lasix 20mg  yesterday with 700cc out, additional 20mg  IV today and aggressively replete K -normal renal -CXR slightly improved today after diuresis    Presenting Diarrhea with likely Enteritis, possible chronic IBD, Cirrhosis CT suggests chronic pancreatitis/recurrent enteritis on CT, Prior EGD 2019 as shown duodenal ulcers, Prior colonoscopy 2019 showed sigmoid diverticulosis -diarrhea resolved after admission, negative GI pathogen panel and C. Diff -Appreciate GI input, once stabilized would benefit from colonoscopy,  -CT abd/pelvis without hepatic abnormality, however cirrhosis suspected clinically with mild ascites, elevated LFT's and bili and hyperammonemia  Coagulopathy, Thrombocytopenia Was on apixaban for afib, Got vitamin K 10/7 -Holding apixiban in the setting of Hgb drop -serial INR, improved  -thrombocytopenia appears to be chronic with platelets 40-100k at least 6 months prior possible cirrhosis which could be contributing.  Minimal ascites on CT abd and AST 74, ALT 18 with bili of 2.6  -peripheral smear pending, haptoglobin low   Anemia of unclear etiology- Looks like baseline around 7.4 to 8, down to 5.9-->2U PRBC --> 9.8 -Haptoglobin low, though LDH normal follow peripheral smear for schistocytes   Hyperglycemia  -SSI   Nutrition -TF    Best practice:  Diet: npo Pain/Anxiety/Delirium protocol (if indicated): prn Fentanyl VAP protocol (if indicated): HOB 30 degrees, suction prn DVT prophylaxis: SCD's GI prophylaxis: protonix Glucose control: SSI Mobility: Bedrest Code Status: Full Family Communication: Pt's son updated 10/12 Disposition: ICU    CRITICAL CARE Performed by: 2020 Gregoire Bennis   Total critical care time: 35 minutes  Critical care time was exclusive of separately billable procedures and treating other patients.  Critical care was necessary to treat or prevent imminent or  life-threatening deterioration.  Critical care was time spent personally by me on the following activities: development of treatment plan with patient and/or surrogate as well as nursing, discussions with consultants, evaluation of patient's response to treatment, examination of patient, obtaining history from patient or surrogate, ordering and performing treatments and interventions, ordering and review of laboratory studies, ordering and review of radiographic studies, pulse oximetry and re-evaluation of patient's condition.    2020 Yoshino Broccoli, PA-C [facility]  Pager# [phone], if no answer [phone]

## 2020-08-31 NOTE — Progress Notes (Signed)
RT transported pt to and from MRI without event. 

## 2020-08-31 NOTE — Progress Notes (Signed)
vLTM EEG complete. No skin breakdown was seen 

## 2020-08-31 NOTE — Progress Notes (Addendum)
Subjective: No further clinical seizures overnight.  Significantly elevated ammonia, on lactulose now.  ROS: Unable to obtain due to poor mental status  Examination  Vital signs in last 24 hours: Temp:  [97.7 F (36.5 C)-98.3 F (36.8 C)] 98.3 F (36.8 C) (10/12 1200) Pulse Rate:  [71-117] 117 (10/12 1100) Resp:  [0-26] 26 (10/12 1100) BP: (134-183)/(79-98) 178/98 (10/12 1122) SpO2:  [100 %] 100 % (10/12 1100) Arterial Line BP: (110-220)/(64-101) 183/101 (10/12 1100) FiO2 (%):  [30 %] 30 % (10/12 1122)  General: lying in bed, not in apparent distress CVS: pulse-normal rate and rhythm RS: breathing comfortably, intubated Extremities: normal, warm Neuro: Opens eyes to verbal stimuli, did not follow command for me but per RN did squeeze left hand earlier in the day, PERRLA, withdraws to noxious stimuli in all 4 extremities  Basic Metabolic Panel: Recent Labs  Lab 08/27/20 0234 08/27/20 0914 08/28/20 1752 08/28/20 1752 08/29/20 0559 08/29/20 1147 08/29/20 1147 08/29/20 1652 08/30/20 0322 08/30/20 0554 08/30/20 1255 08/31/20 0558  NA 140   < > 141   < >  --  141   < > 139 139 138 136 138  K 3.5   < > 3.2*   < >  --  3.1*   < > 3.0* 2.8* 2.6* 3.4* 2.8*  CL 112*   < > 104  --   --  101  --   --   --  101 102 101  CO2 12*   < > 22  --   --  24  --   --   --  21* 21* 21*  GLUCOSE 151*   < > 139*  --   --  123*  --   --   --  114* 107* 154*  BUN 10   < > 13  --   --  15  --   --   --  14 13 18   CREATININE 2.00*   < > 2.30*  --   --  2.45*  --   --   --  2.46* 2.41* 2.39*  CALCIUM 6.3*   < > 6.8*   < >  --  7.2*   < >  --   --  7.5* 7.7* 8.1*  MG 1.2*  --   --   --  2.0  --   --   --   --  2.0  --  1.9  PHOS 4.6  --   --   --  4.7*  --   --   --   --  5.7*  --  5.1*   < > = values in this interval not displayed.    CBC: Recent Labs  Lab 08/26/20 1750 08/27/20 0234 08/27/20 1112 08/27/20 1112 08/28/20 0408 08/28/20 0408 08/29/20 0559 08/29/20 1652 08/30/20 0322  08/30/20 0554 08/31/20 0558  WBC 12.0*   < > 7.6  --  9.3  --  7.1  --   --  5.2 6.9  NEUTROABS 7.6  --   --   --   --   --   --   --   --  4.5  --   HGB 8.1*   < > 9.8*   < > 10.1*   < > 9.8* 10.2* 11.2* 10.1* 10.5*  HCT 23.7*   < > 28.4*   < > 28.7*   < > 27.6* 30.0* 33.0* 28.7* 30.3*  MCV 78.0*   < > 79.8*  --  77.6*  --  76.9*  --   --  76.7* 77.7*  PLT 136*   < > 89*  --  86*  --  59*  --   --  49* 54*   < > = values in this interval not displayed.     Coagulation Studies: Recent Labs    08/30/20 0554 08/31/20 0558  LABPROT 19.6* 18.1*  INR 1.7* 1.6*    Imaging MRI brain ordered and pending   ASSESSMENT AND PLAN: 64 year old female with chronic infarcts and epilepsy who was admitted for sepsis and was noted to have focal seizures.  Chronic infarcts Epilepsy with breakthrough seizures Acute encephalopathy, likely toxic- metabolic, infectious Hyperammonemia Hypertension -Acute encephalopathy likely secondary to hyperammonemia, postictal state, cirrhosis, worsening renal function and possible IBD -EEG finding most likely related to aforementioned systemic comorbidities.  Recommendations -Continue reduce Keppra to 500 mg twice daily due to poor renal function and oxcarbazepine 150 mg twice daily  -We'll discontinue LTM EEG as patient has not had any seizures -Blood pressure management with goal SBP less than 140 -Seizure precautions -As needed IV Ativan 2 mg for clinical seizure-like activity -MRI brain without contrast after discontinuing LTM EEG to look for any acute abnormalities -Management of rest of comorbidities per primary team  .CRITICAL CARE Performed by: Charlsie Quest   Total critical care time: 35 minutes  Critical care time was exclusive of separately billable procedures and treating other patients.  Critical care was necessary to treat or prevent imminent or life-threatening deterioration.  Critical care was time spent personally by me on the  following activities: development of treatment plan with patient and/or surrogate as well as nursing, discussions with consultants, evaluation of patient's response to treatment, examination of patient, obtaining history from patient or surrogate, ordering and performing treatments and interventions, ordering and review of laboratory studies, ordering and review of radiographic studies, pulse oximetry and re-evaluation of patient's condition.   [name] Epilepsy [facility] For questions after 5pm please refer to AMION to reach the Neurologist on call

## 2020-09-01 DIAGNOSIS — G9340 Encephalopathy, unspecified: Secondary | ICD-10-CM

## 2020-09-01 DIAGNOSIS — R579 Shock, unspecified: Secondary | ICD-10-CM | POA: Diagnosis not present

## 2020-09-01 DIAGNOSIS — N19 Unspecified kidney failure: Secondary | ICD-10-CM

## 2020-09-01 DIAGNOSIS — R569 Unspecified convulsions: Secondary | ICD-10-CM | POA: Diagnosis not present

## 2020-09-01 LAB — MAGNESIUM
Magnesium: 1.9 mg/dL (ref 1.7–2.4)
Magnesium: 1.8 mg/dL (ref 1.7–2.4)

## 2020-09-01 LAB — BASIC METABOLIC PANEL
Anion gap: 15 (ref 5–15)
Anion gap: 15 (ref 5–15)
Anion gap: 13 (ref 5–15)
BUN: 21 mg/dL (ref 8–23)
BUN: 21 mg/dL (ref 8–23)
BUN: 23 mg/dL (ref 8–23)
CO2: 22 mmol/L (ref 22–32)
CO2: 21 mmol/L — ABNORMAL LOW (ref 22–32)
CO2: 21 mmol/L — ABNORMAL LOW (ref 22–32)
Calcium: 8.5 mg/dL — ABNORMAL LOW (ref 8.9–10.3)
Calcium: 8.4 mg/dL — ABNORMAL LOW (ref 8.9–10.3)
Calcium: 8.4 mg/dL — ABNORMAL LOW (ref 8.9–10.3)
Chloride: 106 mmol/L (ref 98–111)
Chloride: 104 mmol/L (ref 98–111)
Chloride: 109 mmol/L (ref 98–111)
Creatinine, Ser: 1.9 mg/dL — ABNORMAL HIGH (ref 0.44–1.00)
Creatinine, Ser: 1.93 mg/dL — ABNORMAL HIGH (ref 0.44–1.00)
Creatinine, Ser: 1.82 mg/dL — ABNORMAL HIGH (ref 0.44–1.00)
GFR, Estimated: 28 mL/min — ABNORMAL LOW (ref >60)
GFR, Estimated: 27 mL/min — ABNORMAL LOW (ref >60)
GFR, Estimated: 29 mL/min — ABNORMAL LOW (ref >60)
Glucose, Bld: 181 mg/dL — ABNORMAL HIGH (ref 70–99)
Glucose, Bld: 194 mg/dL — ABNORMAL HIGH (ref 70–99)
Glucose, Bld: 219 mg/dL — ABNORMAL HIGH (ref 70–99)
Potassium: <2 mmol/L — CL (ref 3.5–5.1)
Potassium: 2.3 mmol/L — CL (ref 3.5–5.1)
Sodium: 143 mmol/L (ref 135–145)
Sodium: 140 mmol/L (ref 135–145)
Sodium: 143 mmol/L (ref 135–145)

## 2020-09-01 LAB — PHOSPHORUS
Phosphorus: 4.3 mg/dL (ref 2.5–4.6)
Phosphorus: 4.4 mg/dL (ref 2.5–4.6)

## 2020-09-01 LAB — GLUCOSE, CAPILLARY
Glucose-Capillary: 179 mg/dL — ABNORMAL HIGH (ref 70–99)
Glucose-Capillary: 158 mg/dL — ABNORMAL HIGH (ref 70–99)
Glucose-Capillary: 189 mg/dL — ABNORMAL HIGH (ref 70–99)
Glucose-Capillary: 190 mg/dL — ABNORMAL HIGH (ref 70–99)
Glucose-Capillary: 198 mg/dL — ABNORMAL HIGH (ref 70–99)
Glucose-Capillary: 234 mg/dL — ABNORMAL HIGH (ref 70–99)

## 2020-09-01 LAB — CBC
HCT: 29.2 % — ABNORMAL LOW (ref 36.0–46.0)
Hemoglobin: 10.3 g/dL — ABNORMAL LOW (ref 12.0–15.0)
MCH: 27.2 pg (ref 26.0–34.0)
MCHC: 35.3 g/dL (ref 30.0–36.0)
MCV: 77 fL — ABNORMAL LOW (ref 80.0–100.0)
Platelets: 57 10*3/uL — ABNORMAL LOW (ref 150–400)
RBC: 3.79 MIL/uL — ABNORMAL LOW (ref 3.87–5.11)
RDW: 18.2 % — ABNORMAL HIGH (ref 11.5–15.5)
WBC: 8.1 10*3/uL (ref 4.0–10.5)
nRBC: 0 % (ref 0.0–0.2)

## 2020-09-01 LAB — AMMONIA: Ammonia: 265 umol/L — ABNORMAL HIGH (ref 9–35)

## 2020-09-01 LAB — PROTIME-INR
INR: 1.4 — ABNORMAL HIGH (ref 0.8–1.2)
Prothrombin Time: 16.6 seconds — ABNORMAL HIGH (ref 11.4–15.2)

## 2020-09-01 LAB — TRIGLYCERIDES: Triglycerides: 49 mg/dL (ref <150)

## 2020-09-01 MED ORDER — AMLODIPINE BESYLATE 10 MG PO TABS
10.0000 mg | ORAL_TABLET | Freq: Every day | ORAL | Status: DC
  Administered 2020-09-01 – 2020-09-04 (×4): 10 mg
  Filled 2020-09-01 (×4): qty 1

## 2020-09-01 MED ORDER — POTASSIUM CHLORIDE 20 MEQ/15ML (10%) PO SOLN
40.0000 meq | Freq: Once | ORAL | Status: AC
  Administered 2020-09-01: 40 meq
  Filled 2020-09-01: qty 30

## 2020-09-01 MED ORDER — LABETALOL HCL 5 MG/ML IV SOLN
5.0000 mg | INTRAVENOUS | Status: DC | PRN
  Administered 2020-09-01 – 2020-09-05 (×7): 5 mg via INTRAVENOUS
  Filled 2020-09-01 (×8): qty 4

## 2020-09-01 MED ORDER — POTASSIUM CHLORIDE 10 MEQ/50ML IV SOLN
10.0000 meq | INTRAVENOUS | Status: AC
  Administered 2020-09-01 (×2): 10 meq via INTRAVENOUS
  Filled 2020-09-01 (×2): qty 50

## 2020-09-01 MED ORDER — POTASSIUM CHLORIDE 10 MEQ/100ML IV SOLN
10.0000 meq | INTRAVENOUS | Status: AC
  Administered 2020-09-01 (×4): 10 meq via INTRAVENOUS
  Filled 2020-09-01 (×4): qty 100

## 2020-09-01 MED ORDER — POTASSIUM CHLORIDE 20 MEQ PO PACK
40.0000 meq | PACK | Freq: Once | ORAL | Status: AC
  Administered 2020-09-01: 40 meq via ORAL
  Filled 2020-09-01: qty 2

## 2020-09-01 MED ORDER — RIFAXIMIN 550 MG PO TABS
550.0000 mg | ORAL_TABLET | Freq: Two times a day (BID) | ORAL | Status: DC
  Administered 2020-09-02 – 2020-09-04 (×6): 550 mg
  Filled 2020-09-01 (×6): qty 1

## 2020-09-01 MED ORDER — MAGNESIUM SULFATE 2 GM/50ML IV SOLN
2.0000 g | Freq: Once | INTRAVENOUS | Status: AC
  Administered 2020-09-01: 2 g via INTRAVENOUS
  Filled 2020-09-01: qty 50

## 2020-09-01 MED ORDER — HYDROCORTISONE NA SUCCINATE PF 100 MG IJ SOLR: 50.0000 mg | Freq: Every day | INTRAMUSCULAR | Status: DC

## 2020-09-01 MED ORDER — RIFAXIMIN 550 MG PO TABS
550.0000 mg | ORAL_TABLET | Freq: Two times a day (BID) | ORAL | Status: DC
  Administered 2020-09-01: 550 mg via ORAL
  Filled 2020-09-01: qty 1

## 2020-09-01 NOTE — Progress Notes (Addendum)
Subjective: No clinical seizures overnight.    ROS: Unable to obtain due to poor mental status  Examination  Vital signs in last 24 hours: Temp:  [98 F (36.7 C)-98.3 F (36.8 C)] 98 F (36.7 C) (10/13 0800) Pulse Rate:  [66-121] 101 (10/13 1000) Resp:  [0-28] 25 (10/13 1000) BP: (168-181)/(89-105) 168/105 (10/13 0731) SpO2:  [100 %] 100 % (10/13 1000) Arterial Line BP: (149-180)/(80-147) 152/147 (10/13 1000) FiO2 (%):  [30 %] 30 % (10/13 0731)  General: lying in bed,not in apparent distress CVS: pulse-normal rate and rhythm RS: breathing comfortably,intubated Extremities: normal,warm Neuro:Opens eyes to verbal stimuli, did not follow command for me  did squeeze left hand once but not consistently, PERRLA, withdraws to noxious stimuli in all 4 extremities  Basic Metabolic Panel: Recent Labs  Lab 08/29/20 0559 08/29/20 1147 08/30/20 0554 08/30/20 0554 08/30/20 1255 08/30/20 1255 08/31/20 0558 09/01/20 0549 09/01/20 0742  NA  --    < > 138  --  136  --  138 143 140  K  --    < > 2.6*  --  3.4*  --  2.8* QUESTIONABLE RESULTS, RECOMMEND RECOLLECT TO VERIFY <2.0*  CL  --    < > 101  --  102  --  101 106 104  CO2  --    < > 21*  --  21*  --  21* 22 21*  GLUCOSE  --    < > 114*  --  107*  --  154* 181* 194*  BUN  --    < > 14  --  13  --  $R'18 21 21  'Aa$ CREATININE  --    < > 2.46*  --  2.41*  --  2.39* 1.90* 1.93*  CALCIUM  --    < > 7.5*   < > 7.7*   < > 8.1* 8.5* 8.4*  MG 2.0  --  2.0  --   --   --  1.9 1.9 1.8  PHOS 4.7*  --  5.7*  --   --   --  5.1* 4.3 4.4   < > = values in this interval not displayed.    CBC: Recent Labs  Lab 08/26/20 1750 08/27/20 0234 08/28/20 0408 08/28/20 0408 08/29/20 0559 08/29/20 0559 08/29/20 1652 08/30/20 0322 08/30/20 0554 08/31/20 0558 09/01/20 0549  WBC 12.0*   < > 9.3  --  7.1  --   --   --  5.2 6.9 8.1  NEUTROABS 7.6  --   --   --   --   --   --   --  4.5  --   --   HGB 8.1*   < > 10.1*   < > 9.8*   < > 10.2* 11.2* 10.1*  10.5* 10.3*  HCT 23.7*   < > 28.7*   < > 27.6*   < > 30.0* 33.0* 28.7* 30.3* 29.2*  MCV 78.0*   < > 77.6*  --  76.9*  --   --   --  76.7* 77.7* 77.0*  PLT 136*   < > 86*  --  59*  --   --   --  49* 54* 57*   < > = values in this interval not displayed.     Coagulation Studies: Recent Labs    08/30/20 0554 08/31/20 0558 09/01/20 0549  LABPROT 19.6* 18.1* 16.6*  INR 1.7* 1.6* 1.4*    Imaging MR Brain wo contrast 09/01/2020:  No acute  intracranial abnormality identified. Very advanced chronic ischemic disease appears stable since March.   ASSESSMENT AND PLAN: 64 year old female with chronic infarcts and epilepsy who was admitted for sepsis and was noted to have focal seizures.  Chronic infarcts Epilepsy with breakthrough seizures Acute encephalopathy, likely toxic- metabolic, infectious Hyperammonemia Hypertension -Acute encephalopathy likely secondary to hyperammonemia, postictal state, cirrhosis, worsening renal function and possible IBD -EEG finding most likely related to aforementioned systemic comorbidities. -Even though it appears that patient is not having seizures at this point, hyperammonemia is neurotoxic and can definitely precipitate seizures in who is already epileptic.  Recommendations -Continue reduce Keppra to $RemoveB'500mg'[id number]$  twice daily due to poor renal function and oxcarbazepine 150 mg twice daily  -Discussed with RN my concern for high likelihood of seizure recurrence in this patient. -Recommend neuro checks every 4 hours.  Any worsening of mentation or clinical seizure-like activity should be notified to neurology. -Can increase oxcarbazepine if patient has seizure recurrence -Blood pressure management with goal SBP less than 140 -Seizure precautions -As needed IV Ativan 2 mg for clinical seizure-like activity -Management of rest of comorbidities per primary team - met daughter at bedside and updated regarding management and plan  I have spent a total of  35  minutes with the patient reviewing hospital notes,  test results, labs and examining the patient as well as establishing an assessment and plan that was discussed personally with the patient;s RN.  > 50% of time was spent in direct patient care.    Zeb Comfort Epilepsy [facility] For questions after 5pm please refer to AMION to reach the Neurologist on call

## 2020-09-01 NOTE — Progress Notes (Addendum)
NAMEEmonee [name], MRN:  [id number], DOB:  [birth year], LOS: 6 ADMISSION DATE:  08/26/2020, CONSULTATION DATE: 08/26/2020 REFERRING MD: Dr. Lockie Mola, CHIEF COMPLAINT: Hypertension  Brief History   Ms. Caison is received in transfer from [facility] in anticipation of continuous EEG monitoring. Briefly she was admitted there on 10/7 complaining of diarrhea and weakness.  She was found to have an elevated lactate and elevated white count.  Stool studies were remarkably unremarkable including a stool guaiac which was negative.  CT scan of the abdomen was remarkable for pancreatic calcifications and thickening of the bowel wall most conspicuously in the ileum.  Blood and urine cultures obtained on admission were negative.  She was transiently supported with alpha agents and placed on stress dose steroids as she is chronically on no home steroids for asthma.  Her lactic acidosis has cleared and she is now off of pressors however on 10/9 she developed drooling and a right eye gaze and was felt to be having partial complex seizures.  Mental  status has not recovered.  CT scan of the head showed an old infarct with no acute changes. There was concern that she was suffering from some clinical status and she was transferred to this hospital for continuous EEG monitoring.  On arrival here she was not protecting her airway and was drooling and she was semiemergently intubated for airway protection.    PMH - CAD, CVA, type 2 diabetes, small bowel obstruction, hypertension, gastroesophageal reflux, seizure.     Past Medical History   Past Medical History:  Diagnosis Date  . ACS (acute coronary syndrome) (HCC) 06/12/2015  . Acute on chronic blood loss anemia 11/25/2017  . Arthritis   . Asthma   . 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 CHF (congestive heart failure) (HCC)   . Chronic pain   . Constipation   . Diabetes mellitus without complication (HCC)   . Duodenitis    . Embolic stroke (HCC)   . Falls frequently   . Focal seizure (HCC)   . GERD (gastroesophageal reflux disease)   . Hypertension   . Impacted fracture 02/15/2017   right glenoid  . Pneumonia 10/2015  . Seizures (HCC)    last seizure March 2015  . Small bowel obstruction (HCC) 11/25/2017     Significant Hospital Events   ICU admission 10/7   Presented to the ER with diarrhea and weakness. Hypotensive in spite of IVFs so PCCM consulted. CT abd w/out clear inflammatory changes. Elevated lactate. Pressors required.   10/8: vanc added. On 2 pressors. Repeat hgb down to 5.9 no clear evidence of bleeding. 2 units of blood ordered. abd more distended. Repeat CT abd/pelvis ordered. Repeat lipase ordered for "stigmata of recurrent pancreatitis" this was negative.   10/9 Received 12.5 mg of fentanyl around midnight, unresponsive around 4 AM Code stroke called, head CT negative. Witnessed seizure by RN and tele neurologist Given Ativan 3 mg in divided doses and loaded with Keppra  10/10 Transfer to [facility] for EEG and neuro evaluation   Consults:  Pulmonary critical care  Procedures:  10/10 ETT   Significant Diagnostic Tests:   CT chest, abdomen and pelvis 10/7:Small bilateral pleural effusion, few bandlike densities, sigmoid diverticulosis loose stool within the colon no obvious inflammatory changes.  Repeat CT abd/pelvis 10/8>>> Sigmoid diverticulosis without inflammation. Calcifications are noted within the pancreas consistent with chronic pancreatitis. The colon is mildly distended and fluid-filled. The terminal leum demonstrates circumferential wall thickening concerning  for enteritis or other inflammation. There is mild dilatation of more proximal small bowel with fecalization concerning for some degree of obstruction. Potentially these findings may represent Crohn's Disease.  10/9 head CT >> Multifocal ischemic infarcts involving the right frontal, parietal, and occipital regions, left  frontal lobe, and cerebellum. Remote lacunar infarcts noted involving the bilateral thalami and left basal ganglia. Appearance is relatively stable.  10/8 renal US >> nml  10/8 Echo>>EF 60-65% without evidence of diastolic failure  10/11 EEG>>generalized epileptogenicity as well as severe diffuse encephalopathy. The periodic epileptiform discharges with triphasic morphology are on the ictal-interictal continuum with low potential for seizures. Thee discharges can also be seen due to hyperammonemia, uremia, cefepime toxicity. No seizures were seen during this study.  10/12 MRI brain>>No acute intracranial abnormality identified. Very advanced chronic ischemic disease appears stable since March.   Micro Data:  C. difficile 10/7>> neg GI panel panel 10/7>>>neg COVID-19/flu>> negative BC 10/7>> ng   Antimicrobials:  Piperacillin tazobactam 10/7-  Interim history/subjective:  No overnight events  Objective   Blood pressure (!) 168/105, pulse (!) 101, temperature 98 F (36.7 C), resp. rate (!) 25, height 5' (1.524 m), weight 55.4 kg, SpO2 100 %.    Vent Mode: PSV;CPAP FiO2 (%):  [30 %] 30 % Set Rate:  [16 bmp] 16 bmp Vt Set:  [360 mL] 360 mL PEEP:  [5 cmH20] 5 cmH20 Pressure Support:  [14 cmH20] 14 cmH20 Plateau Pressure:  [19 cmH20-26 cmH20] 25 cmH20   Intake/Output Summary (Last 24 hours) at 09/01/2020 1059 Last data filed at 09/01/2020 1004 Gross per 24 hour  Intake 1471.35 ml  Output 1905 ml  Net -433.65 ml   Filed Weights   08/27/20 0316 08/28/20 0414 08/29/20 0500  Weight: 55.5 kg 55.5 kg 55.4 kg     General:  Critically ill-appearing F, intubated and minimally responsive  HEENT: MM pink/moist, ETT in place Neuro: off sedation, slight eye-opening to voice, PERRLA, +gag, grimace to pain, not following commands  CV: s1s2 rrr, no m/r/g PULM:  ETT in place, no rhonchi or wheezing, tolerating CPAP GI: soft, bsx4 active  Extremities: warm/dry, 3+  edema  Skin: no  rashes or lesions    Resolved Hospital Problem list     Assessment & Plan:  64 y.o. F who presented with diarrhea, weakness and possible sepsis who had seizure-like activity and shock. No clear source of sepsis, now off pressors. No acute CVA on CT.  Transferred to [facility] for Neuro eval and EEG.      Complex partial seizure, known seizure disorder   Requiring intubation for airway protection, No further seizures on EEG. MRI without acute findings  P: -Propofol off with continued poor neurologic recovery -Appreciate neuro recommendations, on Keppra 500mg  bid with Oxcarb 150mg  bid, concern for seizure recurrence 2/2 hyperammonemia and baseline sz disorder -continue seizure precautions, notify Neurology of any seizure-like activity  -PRN Ativan -prn Fentanyl -Maintain full vent support with SAT/SBT as tolerated -titrate Vent setting to maintain SpO2 greater than or equal to 90%. -HOB elevated 30 degrees. -Plateau pressures less than 30 cm H20.  -Follow chest x-ray, ABG prn.   -Bronchial hygiene and RT/bronchodilator protocol.   Acute metabolic encephalopathy, Hyperammonemia Superimposed on prior CVA likely contributing to expressive aphasia along with hepatic encephalopathy.  Head CT and MRI negative for acute findings P: -minimally responsive off propofol, elevated ammonia level and post-ictal state likely contributing  -Ammonia level rising to 265 today, now off valproic acid -continue lactulose  Shock w/ lactic acidosis. Lactic acid peaked on 7.5 on 10/7, downtrend to 2.3 the next day, Now off pressors, unclear source of infection, initially presented with diarrhea and concern for enteritis vs crohn's on CT. Now with formed stools and negative C. Diff and GI panel.  7 day course of Zosyn completed P: -Resolved appears resolved, further wean stress dose steroids to Solufortef 50mg  qd, ok per GI    AGMA Appears to be both anion and non-anion gap related. 2/2 lactic acidosis c/b  NaCl resuscitation and bicarb loss from diarrhea. Lactic acidosis resolved. Now suspect due to continued renal failure P: -follow serial BMET's, replace electrolytes as needed    AKI, Hypokalemia  Possibly secondary to ATN in the setting of shock, UOP 0.34ml/kg last shift, creatinine stable  Renal US was WNL, urine studies consistent with pre-renal cause P: -Creatinine slightly improved today with 1.2cc/kg/hr out after repeat Lasix 20mg  -Hold Lasix today in the setting of severe hypokalemia -Continue to monitor renal indicies - K po and K IV, Mag 2g IV and repeat BMP this afternoon, will likely require continued aggressive replacement in the setting of diarrhea induced by necessary lactulose    Presenting Diarrhea with likely Enteritis, possible chronic IBD, Cirrhosis CT suggests chronic pancreatitis/recurrent enteritis on CT, Prior EGD 2019 as shown duodenal ulcers, Prior colonoscopy 2019 showed sigmoid diverticulosis. Neg C. Diff and GI pathogen panel P: -diarrhea had resolved prior to initiating Lactulose -Appreciate GI input, once stabilized would benefit from colonoscopy,  -CT abd/pelvis without hepatic abnormality, however cirrhosis suspected clinically with mild ascites, elevated LFT's and bili and hyperammonemia  Coagulopathy, Thrombocytopenia Thrombocytopenia appears chronic at least 6 months on chart review, possibly secondary to cirrhosis. Was on apixaban for afib, Got vitamin K 10/7 P: -serial INR, improved  -peripheral smear pending, consider MAHA/TMA, haptoglobin low   Anemia of unclear etiology- Looks like baseline around 7.4 to 8, down to 5.9-->2U PRBC --> 9.8 Now stable P: -Haptoglobin low, though LDH normal follow peripheral smear for schistocytes   Hyperglycemia  -SSI   Nutrition -TF   HTN -BP increasing over the last day, have resumed Metoprolol and Norvasc increased from 5mg  to 10mg  today, prn Labetalol    Best practice:  Diet:  npo Pain/Anxiety/Delirium protocol (if indicated): prn Fentanyl VAP protocol (if indicated): HOB 30 degrees, suction prn DVT prophylaxis: SCD's GI prophylaxis: protonix Glucose control: SSI Mobility: Bedrest Code Status: Full Family Communication: Pt's son updated  Disposition: ICU    CRITICAL CARE Performed by: Darcella Gasman Makya Yurko   Total critical care time: 42 minutes  Critical care time was exclusive of separately billable procedures and treating other patients.  Critical care was necessary to treat or prevent imminent or life-threatening deterioration.  Critical care was time spent personally by me on the following activities: development of treatment plan with patient and/or surrogate as well as nursing, discussions with consultants, evaluation of patient's response to treatment, examination of patient, obtaining history from patient or surrogate, ordering and performing treatments and interventions, ordering and review of laboratory studies, ordering and review of radiographic studies, pulse oximetry and re-evaluation of patient's condition.    Darcella Gasman Maylie Ashton, PA-C [place] [facility]  Pager# [phone], if no answer [phone]

## 2020-09-01 NOTE — Progress Notes (Signed)
eLink Physician-Brief Progress Note Patient Name: Amber [name] DOB: [birth year] MRN: [id number]   Date of Service  09/01/2020  HPI/Events of Note  K+ 2.3  eICU Interventions  Adult electrolyte replacement protocol ordered.        [name] 09/01/2020, 8:17 PM

## 2020-09-01 NOTE — Progress Notes (Signed)
CRITICAL VALUE ALERT  Critical Value: K+ <2.0  Date & Time Notied:  10/13 0929  Provider Notified: Vernona Rieger Gleason, PA  Orders Received/Actions taken: Potassium per tube/ Potassium IV ordered and given.

## 2020-09-01 NOTE — Progress Notes (Signed)
Patient placed back on full ventilator support for night time rest.  

## 2020-09-01 NOTE — Progress Notes (Signed)
CRITICAL VALUE ALERT  Critical Value:  K+ 2.3  Date & Time Notied:  10/13 1618  Provider Notified: Delma Officer RN  Orders Received/Actions taken: Awaiting orders

## 2020-09-02 DIAGNOSIS — J9610 Chronic respiratory failure, unspecified whether with hypoxia or hypercapnia: Secondary | ICD-10-CM

## 2020-09-02 DIAGNOSIS — J9601 Acute respiratory failure with hypoxia: Secondary | ICD-10-CM | POA: Diagnosis not present

## 2020-09-02 LAB — BASIC METABOLIC PANEL
Anion gap: 13 (ref 5–15)
Anion gap: 11 (ref 5–15)
BUN: 26 mg/dL — ABNORMAL HIGH (ref 8–23)
BUN: 26 mg/dL — ABNORMAL HIGH (ref 8–23)
CO2: 22 mmol/L (ref 22–32)
CO2: 22 mmol/L (ref 22–32)
Calcium: 8.8 mg/dL — ABNORMAL LOW (ref 8.9–10.3)
Calcium: 8.6 mg/dL — ABNORMAL LOW (ref 8.9–10.3)
Chloride: 111 mmol/L (ref 98–111)
Chloride: 114 mmol/L — ABNORMAL HIGH (ref 98–111)
Creatinine, Ser: 1.66 mg/dL — ABNORMAL HIGH (ref 0.44–1.00)
Creatinine, Ser: 1.39 mg/dL — ABNORMAL HIGH (ref 0.44–1.00)
GFR, Estimated: 33 mL/min — ABNORMAL LOW (ref >60)
GFR, Estimated: 41 mL/min — ABNORMAL LOW (ref >60)
Glucose, Bld: 213 mg/dL — ABNORMAL HIGH (ref 70–99)
Glucose, Bld: 133 mg/dL — ABNORMAL HIGH (ref 70–99)
Potassium: 2.9 mmol/L — ABNORMAL LOW (ref 3.5–5.1)
Potassium: 3.6 mmol/L (ref 3.5–5.1)
Sodium: 146 mmol/L — ABNORMAL HIGH (ref 135–145)
Sodium: 147 mmol/L — ABNORMAL HIGH (ref 135–145)

## 2020-09-02 LAB — GLUCOSE, CAPILLARY
Glucose-Capillary: 221 mg/dL — ABNORMAL HIGH (ref 70–99)
Glucose-Capillary: 145 mg/dL — ABNORMAL HIGH (ref 70–99)
Glucose-Capillary: 150 mg/dL — ABNORMAL HIGH (ref 70–99)
Glucose-Capillary: 111 mg/dL — ABNORMAL HIGH (ref 70–99)
Glucose-Capillary: 150 mg/dL — ABNORMAL HIGH (ref 70–99)
Glucose-Capillary: 149 mg/dL — ABNORMAL HIGH (ref 70–99)

## 2020-09-02 LAB — AMMONIA: Ammonia: 141 umol/L — ABNORMAL HIGH (ref 9–35)

## 2020-09-02 LAB — MAGNESIUM: Magnesium: 2.5 mg/dL — ABNORMAL HIGH (ref 1.7–2.4)

## 2020-09-02 LAB — PATHOLOGIST SMEAR REVIEW

## 2020-09-02 MED ORDER — PANTOPRAZOLE SODIUM 40 MG PO PACK: 40.0000 mg | PACK | Freq: Every day | ORAL | Status: DC

## 2020-09-02 MED ORDER — POTASSIUM CHLORIDE 20 MEQ/15ML (10%) PO SOLN
40.0000 meq | Freq: Once | ORAL | Status: AC
  Administered 2020-09-02: 40 meq
  Filled 2020-09-02: qty 30

## 2020-09-02 MED ORDER — LACTULOSE 10 GM/15ML PO SOLN
20.0000 g | Freq: Two times a day (BID) | ORAL | Status: DC
  Administered 2020-09-02 – 2020-09-04 (×5): 20 g
  Filled 2020-09-02 (×5): qty 30

## 2020-09-02 MED ORDER — FREE WATER
200.0000 mL | Freq: Four times a day (QID) | Status: DC
  Administered 2020-09-02 – 2020-09-04 (×9): 200 mL

## 2020-09-02 MED ORDER — FAMOTIDINE 40 MG/5ML PO SUSR
20.0000 mg | Freq: Two times a day (BID) | ORAL | Status: DC
  Administered 2020-09-02 – 2020-09-04 (×6): 20 mg
  Filled 2020-09-02 (×7): qty 2.5

## 2020-09-02 MED ORDER — INSULIN ASPART 100 UNIT/ML ~~LOC~~ SOLN
0.0000 [IU] | SUBCUTANEOUS | Status: DC
  Administered 2020-09-02 (×4): 1 [IU] via SUBCUTANEOUS
  Administered 2020-09-03: 2 [IU] via SUBCUTANEOUS
  Administered 2020-09-03 – 2020-09-10 (×9): 1 [IU] via SUBCUTANEOUS

## 2020-09-02 MED ORDER — POLYETHYLENE GLYCOL 3350 17 G PO PACK: 17.0000 g | PACK | Freq: Every day | ORAL | Status: DC | PRN

## 2020-09-02 MED ORDER — LACTULOSE 10 GM/15ML PO SOLN
20.0000 g | Freq: Two times a day (BID) | ORAL | Status: DC
  Administered 2020-09-02: 20 g via ORAL
  Filled 2020-09-02: qty 30

## 2020-09-02 MED ORDER — POTASSIUM CHLORIDE 10 MEQ/50ML IV SOLN
10.0000 meq | INTRAVENOUS | Status: AC
  Administered 2020-09-02 (×4): 10 meq via INTRAVENOUS
  Filled 2020-09-02 (×2): qty 50

## 2020-09-02 NOTE — Progress Notes (Signed)
NAMETerrianne [name], MRN:  [id number], DOB:  1956/10/04, LOS: 7 ADMISSION DATE:  08/26/2020, CONSULTATION DATE: 08/26/2020 REFERRING MD: Dr. Lockie Mola, CHIEF COMPLAINT: Hypertension  Brief History   [name] is received in transfer from [facility] in anticipation of continuous EEG monitoring. Briefly she was admitted there on 10/7 complaining of diarrhea and weakness.  She was found to have an elevated lactate and elevated white count.  Stool studies were remarkably unremarkable including a stool guaiac which was negative.  CT scan of the abdomen was remarkable for pancreatic calcifications and thickening of the bowel wall most conspicuously in the ileum.  Blood and urine cultures obtained on admission were negative.  She was transiently supported with alpha agents and placed on stress dose steroids as she is chronically on no home steroids for asthma.  Her lactic acidosis has cleared and she is now off of pressors however on 10/9 she developed drooling and a right eye gaze and was felt to be having partial complex seizures.  Mental  status has not recovered.  CT scan of the head showed an old infarct with no acute changes. There was concern that she was suffering from some clinical status and she was transferred to this hospital for continuous EEG monitoring.  On arrival here she was not protecting her airway and was drooling and she was semiemergently intubated for airway protection.    PMH - CAD, CVA, type 2 diabetes, small bowel obstruction, hypertension, gastroesophageal reflux, seizure.    Past Medical History   Past Medical History:  Diagnosis Date  . ACS (acute coronary syndrome) (HCC) 06/12/2015  . Acute on chronic blood loss anemia 11/25/2017  . Arthritis   . Asthma   . 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 CHF (congestive heart failure) (HCC)   . Chronic pain   . Constipation   . Diabetes mellitus without complication (HCC)   . Duodenitis   .  Embolic stroke (HCC)   . Falls frequently   . Focal seizure (HCC)   . GERD (gastroesophageal reflux disease)   . Hypertension   . Impacted fracture 02/15/2017   right glenoid  . Pneumonia 10/2015  . Seizures (HCC)    last seizure March 2015  . Small bowel obstruction (HCC) 11/25/2017    Significant Hospital Events   ICU admission 10/7   Presented to the ER with diarrhea and weakness. Hypotensive in spite of IVFs so PCCM consulted. CT abd w/out clear inflammatory changes. Elevated lactate. Pressors required.   10/8: vanc added. On 2 pressors. Repeat hgb down to 5.9 no clear evidence of bleeding. 2 units of blood ordered. abd more distended. Repeat CT abd/pelvis ordered. Repeat lipase ordered for "stigmata of recurrent pancreatitis" this was negative.   10/9 Received 12.5 mg of fentanyl around midnight, unresponsive around 4 AM Code stroke called, head CT negative. Witnessed seizure by RN and tele neurologist Given Ativan 3 mg in divided doses and loaded with Keppra  10/10 Transfer to [facility] for EEG and neuro evaluation  Consults:  Pulmonary critical care  Procedures:  10/10 ETT >>  Significant Diagnostic Tests:   CT chest, abdomen and pelvis 10/7:Small bilateral pleural effusion, few bandlike densities, sigmoid diverticulosis loose stool within the colon no obvious inflammatory changes.  Repeat CT abd/pelvis 10/8>>> Sigmoid diverticulosis without inflammation. Calcifications are noted within the pancreas consistent with chronic pancreatitis. The colon is mildly distended and fluid-filled. The terminal leum demonstrates circumferential wall thickening concerning for enteritis or  other inflammation. There is mild dilatation of more proximal small bowel with fecalization concerning for some degree of obstruction. Potentially these findings may represent Crohn's Disease.  10/9 head CT >> Multifocal ischemic infarcts involving the right frontal, parietal, and occipital regions, left frontal  lobe, and cerebellum. Remote lacunar infarcts noted involving the bilateral thalami and left basal ganglia. Appearance is relatively stable.  10/8 renal US >> nml  10/8 Echo>>EF 60-65% without evidence of diastolic failure  10/11 EEG>>generalized epileptogenicity as well as severe diffuse encephalopathy. The periodic epileptiform discharges with triphasic morphology are on the ictal-interictal continuum with low potential for seizures. Thee discharges can also be seen due to hyperammonemia, uremia, cefepime toxicity. No seizures were seen during this study.  10/12 MRI brain>>No acute intracranial abnormality identified. Very advanced chronic ischemic disease appears stable since March.  Micro Data:  C. difficile 10/7>> neg GI panel panel 10/7>>>neg COVID-19/flu>> negative BC 10/7>> ng   Antimicrobials:  Piperacillin tazobactam 10/7- 10/13  Interim history/subjective:  No events overnight Afebrile Weaning on PSV 14/5, weaned 12 hours yesterday  Net +8.9 L  Objective   Blood pressure (!) 149/102, pulse 97, temperature 98.6 F (37 C), temperature source Axillary, resp. rate (!) 25, height 5' (1.524 m), weight 55.4 kg, SpO2 100 %.    Vent Mode: PSV FiO2 (%):  [30 %] 30 % Set Rate:  [16 bmp] 16 bmp Vt Set:  [360 mL] 360 mL PEEP:  [5 cmH20] 5 cmH20 Pressure Support:  [14 cmH20] 14 cmH20 Plateau Pressure:  [20 cmH20-22 cmH20] 22 cmH20   Intake/Output Summary (Last 24 hours) at 09/02/2020 0759 Last data filed at 09/02/2020 0700 Gross per 24 hour  Intake 2008.71 ml  Output 1380 ml  Net 628.71 ml   Filed Weights   08/27/20 0316 08/28/20 0414 08/29/20 0500  Weight: 55.5 kg 55.5 kg 55.4 kg   General:  Older female in NAD, intubated on mechanical ventilation HEENT: MM pink/moist, pupils 3/reactive, scleral icterus, ETT/ OGT Neuro: Eyes open, follows simple commands- nods appriopately, sticks tongue out, lightly squeezes LUE, wiggles toes R>L, no gross movement/ or to gravity in  extremities CV: rr, NSR, no murmur PULM:  Currently on PSV 14/5, 30%, bibasilar rales, slight intermittent exp wheeze RUL GI: soft, bs +, flexiseal (~700 ml/ 24hr), foley  Extremities: warm/dry, anasarca Skin: no rashes, healed abrasions to right arm, fluid blister to left hand, some skin breakdown in perineum   RUE PICC wnl   Labs K 2.9 Improving sCr 1.82-> 1.66  Resolved Hospital Problem list   AGMA  Assessment & Plan:  64 y.o. F who presented with diarrhea, weakness and possible sepsis who had seizure-like activity and shock. No clear source of sepsis, now off pressors. No acute CVA on CT.  Transferred to [facility] for Neuro eval and EEG.    Complex partial seizure, known seizure disorder   Requiring intubation for airway protection, No further seizures on EEG. MRI without acute findings  P: Neuro following AED per neurology- keppra, oxcarbazepine  Seizure precautions Neuro checks q 4 Ativan prn seizure Minimizing sedation  SBP goal < 140 per neuro  Acute respiratory insufficiency related to inability to protect airway due to above P:  Full MV support, PRVC 7 cc/kg Weaning daily, still requiring 14/5 and mental status has been a barrier to extubation; however mental status seems to be slowly improving  Titrate Vent setting to maintain SpO2 greater than or equal to 90% VAP bundle/ PPI Ongoing pulmonary hygiene Intermittent CXR Lasix  w/ aggressive KCL replete  Acute metabolic encephalopathy, Hyperammonemia +/- postictal  Superimposed on prior CVA likely contributing to expressive aphasia along with hepatic encephalopathy.  Head CT and MRI negative for acute findings - given increasing ammonia levels, valproic acid d/c 10/10-> Keppra - off propofol  P: Minimize sedation- prn fentanyl for RASS goal 0/-1 Continue lactulose and rifaximin (added 10/13) Pending ammonia level   Shock w/ lactic acidosis- resolved  Lactic acid peaked on 7.5 on 10/7, downtrend to 2.3 the next day,  Now off pressors, unclear source of infection, initially presented with diarrhea and concern for enteritis vs crohn's on CT. Now with formed stools and negative C. Diff and GI panel.  7 day course of Zosyn completed P: Stop stress dose steroids  D/c aline (off pressors since 10/11)  AKI, Hypokalemia  Anasarca  Mild hypernatremia Possibly secondary to ATN in the setting of shock Renal US was WNL, urine studies consistent with pre-renal cause Slowly improving sCr, UOP remains 0.5 ml/kg today  P: S/p KCL replete (80 meq total) Check mag this am  Recheck K at 1400, needs diuresis but would like to K better first  Add free water  Trend renal indices/ strict I/O's  Presenting Diarrhea with likely Enteritis, possible chronic IBD, Cirrhosis CT suggests chronic pancreatitis/recurrent enteritis on CT, Prior EGD 2019 as shown duodenal ulcers, Prior colonoscopy 2019 showed sigmoid diverticulosis. Neg C. Diff and GI pathogen panel -diarrhea had resolved prior to initiating Lactulose -Appreciate GI input, once stabilized would benefit from colonoscopy,  -CT abd/pelvis without hepatic abnormality, however cirrhosis suspected clinically with mild ascites, elevated LFT's and bili and hyperammonemia P: D/c daily miralax-> change to prn  Check LFT in am   Coagulopathy, Thrombocytopenia Thrombocytopenia appears chronic at least 6 months on chart review, possibly secondary to cirrhosis. Was on apixaban for afib, Got vitamin K 10/7 - INR 1.4 oin 10/13 P: Smear pending, consider MAHA/TMA, haptoglobin low Change protonix to pepcid Trend CBC/ intermittent INR  Anemia of unclear etiology- Looks like baseline around 7.4 to 8, down to 5.9-->2U PRBC --> 9.8 Now stable P: CBC in am, no signs of bleeding Haptoglobin low, though LDH normal follow peripheral smear for schistocytes  Hyperglycemia  -SSI, change to very sensitive to sensitive as CBGs running > 180  Nutrition -TF  HTN P:  Continue  Norvasc and metoprolol w/ prn labetalol for SBP goal < 140 per neuro  Best practice:  Diet: NPO/ TF  Pain/Anxiety/Delirium protocol (if indicated): prn Fentanyl VAP protocol (if indicated): HOB 30 degrees, suction prn DVT prophylaxis: SCD's given thrombocytopenia GI prophylaxis: pepcid Glucose control: SSI Mobility: Bedrest Code Status: Full Family Communication: pending 10/14 Disposition: ICU    CRITICAL CARE Performed by: Posey Boyer   Total critical care time: 35 minutes  Critical care time was exclusive of separately billable procedures and treating other patients.  Critical care was necessary to treat or prevent imminent or life-threatening deterioration.  Critical care was time spent personally by me on the following activities: development of treatment plan with patient and/or surrogate as well as nursing, discussions with consultants, evaluation of patient's response to treatment, examination of patient, obtaining history from patient or surrogate, ordering and performing treatments and interventions, ordering and review of laboratory studies, ordering and review of radiographic studies, pulse oximetry and re-evaluation of patient's condition.   Posey Boyer, ACNP [facility] 09/02/2020, 8:00 AM  See Loretha Stapler for personal pager PCCM on call pager [phone]

## 2020-09-02 NOTE — Progress Notes (Signed)
[facility] Progress Note  Amber [name] 64 y.o. [birth year]  CC: Abnormal CT scan, possible inflammatory bowel disease   Subjective: Patient seen and examined at bedside.  She remains intubated.  Discussed with RN at bedside. Having some loose stools after starting lactulose. No blood in the stool.  ROS : Not able to obtain  Objective: Vital signs in last 24 hours: Vitals:   09/02/20 0700 09/02/20 0800  BP: (!) 149/102 (!) 124/100  Pulse: 97 97  Resp: (!) 25 15  Temp:  98.2 F (36.8 C)  SpO2: 100% 100%    Physical Exam:  General:   Intubated  Head:  Normocephalic, without obvious abnormality, atraumatic  Eyes:   Limited evaluation. Scleral icterus noted  Lungs:    Coarse breath sounds bilaterally, anterior exam only  Heart:  Regular rate and rhythm, S1, S2 normal  Abdomen:    Mild distended abdomen, bowel sounds present, no peritoneal signs.          Lab Results: Recent Labs    09/01/20 0549 09/01/20 0549 09/01/20 0742 09/01/20 0742 09/01/20 1618 09/02/20 0515  NA 143   < > 140   < > 143 146*  K QUESTIONABLE RESULTS, RECOMMEND RECOLLECT TO VERIFY   < > <2.0*   < > 2.3* 2.9*  CL 106   < > 104   < > 109 111  CO2 22   < > 21*   < > 21* 22  GLUCOSE 181*   < > 194*   < > 219* 213*  BUN 21   < > 21   < > 23 26*  CREATININE 1.90*   < > 1.93*   < > 1.82* 1.66*  CALCIUM 8.5*   < > 8.4*   < > 8.4* 8.8*  MG 1.9  --  1.8  --   --   --   PHOS 4.3  --  4.4  --   --   --    < > = values in this interval not displayed.   No results for input(s): AST, ALT, ALKPHOS, BILITOT, PROT, ALBUMIN in the last 72 hours. Recent Labs    08/31/20 0558 09/01/20 0549  WBC 6.9 8.1  HGB 10.5* 10.3*  HCT 30.3* 29.2*  MCV 77.7* 77.0*  PLT 54* 57*   Recent Labs    08/31/20 0558 09/01/20 0549  LABPROT 18.1* 16.6*  INR 1.6* 1.4*      Assessment/Plan: -Abnormal CT scan concerning for inflammatory bowel disease.  CT abdomen pelvis without contrast on August 15, 2020  showed mild inflammation in the ascending, transverse and descending colon.  On August 26, 2020 showed loose stool throughout the colon without any inflammation.  Another CT scan August 28, 2020 showed circumferential wall thickening of the terminal ileum. -Occult blood negative.  Normal ESR. Elevated CRP.  -Cirrhosis of the liver -Severe diffuse encephalopathy. Mostly multifactorial.  Recommendations ------------------------- -Negative GI pathogen panel and C. Difficile. Normal ESR. Significantly elevated high-sensitivity CRP. -Given negative CT scan few months ago, patient less likely has chronic inflammatory bowel disease but remains in differential. -Off IV steroids now. -DC MiraLAX. Continue lactulose and rifaximin. -Repeat LFTs in the morning. -GI will follow periodically     Otis Brace MD, [place] 09/02/2020, 9:10 AM  Contact #  [phone]

## 2020-09-02 NOTE — Progress Notes (Signed)
eLink Physician-Brief Progress Note Patient Name: Amber [name] DOB: February 17, 1956 MRN: [id number]   Date of Service  09/02/2020  HPI/Events of Note  K+ 2.9  eICU Interventions  Adult electrolyte replacement protocol for K+ ordered.        Thomasene Lot Lekendrick Alpern 09/02/2020, 6:24 AM

## 2020-09-03 DIAGNOSIS — J9611 Chronic respiratory failure with hypoxia: Secondary | ICD-10-CM | POA: Diagnosis not present

## 2020-09-03 DIAGNOSIS — R569 Unspecified convulsions: Secondary | ICD-10-CM | POA: Diagnosis not present

## 2020-09-03 LAB — CBC
HCT: 28.6 % — ABNORMAL LOW (ref 36.0–46.0)
Hemoglobin: 9.9 g/dL — ABNORMAL LOW (ref 12.0–15.0)
MCH: 26.6 pg (ref 26.0–34.0)
MCHC: 34.6 g/dL (ref 30.0–36.0)
MCV: 76.9 fL — ABNORMAL LOW (ref 80.0–100.0)
Platelets: 51 10*3/uL — ABNORMAL LOW (ref 150–400)
RBC: 3.72 MIL/uL — ABNORMAL LOW (ref 3.87–5.11)
RDW: 19.9 % — ABNORMAL HIGH (ref 11.5–15.5)
WBC: 10.5 10*3/uL (ref 4.0–10.5)
nRBC: 0 % (ref 0.0–0.2)

## 2020-09-03 LAB — COMPREHENSIVE METABOLIC PANEL
ALT: 51 U/L — ABNORMAL HIGH (ref 0–44)
AST: 155 U/L — ABNORMAL HIGH (ref 15–41)
Albumin: 2.1 g/dL — ABNORMAL LOW (ref 3.5–5.0)
Alkaline Phosphatase: 648 U/L — ABNORMAL HIGH (ref 38–126)
Anion gap: 10 (ref 5–15)
BUN: 27 mg/dL — ABNORMAL HIGH (ref 8–23)
CO2: 22 mmol/L (ref 22–32)
Calcium: 8.6 mg/dL — ABNORMAL LOW (ref 8.9–10.3)
Chloride: 114 mmol/L — ABNORMAL HIGH (ref 98–111)
Creatinine, Ser: 1.19 mg/dL — ABNORMAL HIGH (ref 0.44–1.00)
GFR, Estimated: 49 mL/min — ABNORMAL LOW (ref >60)
Glucose, Bld: 172 mg/dL — ABNORMAL HIGH (ref 70–99)
Potassium: 3.3 mmol/L — ABNORMAL LOW (ref 3.5–5.1)
Sodium: 146 mmol/L — ABNORMAL HIGH (ref 135–145)
Total Bilirubin: 6.3 mg/dL — ABNORMAL HIGH (ref 0.3–1.2)
Total Protein: 5.5 g/dL — ABNORMAL LOW (ref 6.5–8.1)

## 2020-09-03 LAB — POTASSIUM
Potassium: 4.5 mmol/L (ref 3.5–5.1)
Potassium: 3.8 mmol/L (ref 3.5–5.1)

## 2020-09-03 LAB — GLUCOSE, CAPILLARY
Glucose-Capillary: 163 mg/dL — ABNORMAL HIGH (ref 70–99)
Glucose-Capillary: 113 mg/dL — ABNORMAL HIGH (ref 70–99)
Glucose-Capillary: 117 mg/dL — ABNORMAL HIGH (ref 70–99)
Glucose-Capillary: 107 mg/dL — ABNORMAL HIGH (ref 70–99)
Glucose-Capillary: 111 mg/dL — ABNORMAL HIGH (ref 70–99)
Glucose-Capillary: 146 mg/dL — ABNORMAL HIGH (ref 70–99)

## 2020-09-03 MED ORDER — POTASSIUM CHLORIDE 20 MEQ/15ML (10%) PO SOLN
20.0000 meq | ORAL | Status: AC
  Administered 2020-09-03 (×2): 20 meq
  Filled 2020-09-03 (×2): qty 15

## 2020-09-03 MED ORDER — POTASSIUM CHLORIDE 10 MEQ/50ML IV SOLN
10.0000 meq | INTRAVENOUS | Status: AC
  Administered 2020-09-03 (×4): 10 meq via INTRAVENOUS
  Filled 2020-09-03 (×2): qty 50

## 2020-09-03 MED ORDER — PANCRELIPASE (LIP-PROT-AMYL) 12000-38000 UNITS PO CPEP
24000.0000 [IU] | ORAL_CAPSULE | Freq: Three times a day (TID) | ORAL | Status: DC
  Administered 2020-09-03 – 2020-09-08 (×16): 24000 [IU] via ORAL
  Filled 2020-09-03 (×18): qty 2

## 2020-09-03 MED ORDER — FUROSEMIDE 10 MG/ML IJ SOLN
40.0000 mg | Freq: Once | INTRAMUSCULAR | Status: AC
  Administered 2020-09-03: 40 mg via INTRAVENOUS
  Filled 2020-09-03: qty 4

## 2020-09-03 NOTE — Progress Notes (Signed)
Brief Progress Note  Potassium has rebounded to 4.5 Will give lasix 40 x 1 dose to address net + of 10.5 L Will check potassium at 2200 to allow for repletion tonight as needed.   Bevelyn Ngo, MSN, AGACNP-BC [facility] See Amion for personal pager PCCM on call pager [phone] 09/03/2020 2:49 PM

## 2020-09-03 NOTE — Progress Notes (Signed)
eLink Physician-Brief Progress Note Patient Name: Amber [name] DOB: December 01, 1955 MRN: [id number]   Date of Service  09/03/2020  HPI/Events of Note  K+ 3.3  eICU Interventions  Adult electrolyte replacement protocol orders for K+  Entered.        Amber [name] 09/03/2020, 5:41 AM

## 2020-09-03 NOTE — Progress Notes (Signed)
NAME:  Amber [name], MRN:  [id number], DOB:  [birth year], LOS: 8 ADMISSION DATE:  08/26/2020, CONSULTATION DATE: 08/26/2020 REFERRING MD: Dr. Lockie Mola, CHIEF COMPLAINT: Hypertension  Brief History   Amber [name] is received in transfer from [facility] in anticipation of continuous EEG monitoring. Briefly she was admitted there on 10/7 complaining of diarrhea and weakness.  She was found to have an elevated lactate and elevated white count.  Stool studies were remarkably unremarkable including a stool guaiac which was negative.  CT scan of the abdomen was remarkable for pancreatic calcifications and thickening of the bowel wall most conspicuously in the ileum.  Blood and urine cultures obtained on admission were negative.  She was transiently supported with alpha agents and placed on stress dose steroids as she is chronically on no home steroids for asthma.  Her lactic acidosis has cleared and she is now off of pressors however on 10/9 she developed drooling and a right eye gaze and was felt to be having partial complex seizures.  Mental  status has not recovered.  CT scan of the head showed an old infarct with no acute changes. There was concern that she was suffering from some clinical status and she was transferred to this hospital for continuous EEG monitoring.  On arrival here she was not protecting her airway and was drooling and she was semiemergently intubated for airway protection.    PMH - CAD, CVA, type 2 diabetes, small bowel obstruction, hypertension, gastroesophageal reflux, seizure.    Past Medical History   Past Medical History:  Diagnosis Date  . ACS (acute coronary syndrome) (HCC) 06/12/2015  . Acute on chronic blood loss anemia 11/25/2017  . Arthritis   . Asthma   . 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 CHF (congestive heart failure) (HCC)   . Chronic pain   . Constipation   . Diabetes mellitus without complication (HCC)   . Duodenitis   .  Embolic stroke (HCC)   . Falls frequently   . Focal seizure (HCC)   . GERD (gastroesophageal reflux disease)   . Hypertension   . Impacted fracture 02/15/2017   right glenoid  . Pneumonia 10/2015  . Seizures (HCC)    last seizure March 2015  . Small bowel obstruction (HCC) 11/25/2017    Significant Hospital Events   ICU admission 10/7   Presented to the ER with diarrhea and weakness. Hypotensive in spite of IVFs so PCCM consulted. CT abd w/out clear inflammatory changes. Elevated lactate. Pressors required.   10/8: vanc added. On 2 pressors. Repeat hgb down to 5.9 no clear evidence of bleeding. 2 units of blood ordered. abd more distended. Repeat CT abd/pelvis ordered. Repeat lipase ordered for "stigmata of recurrent pancreatitis" this was negative.   10/9 Received 12.5 mg of fentanyl around midnight, unresponsive around 4 AM Code stroke called, head CT negative. Witnessed seizure by RN and tele neurologist Given Ativan 3 mg in divided doses and loaded with Keppra  10/10 Transfer to [facility] for EEG and neuro evaluation  Consults:  Pulmonary critical care  Procedures:  10/10 ETT >>  Significant Diagnostic Tests:   CT chest, abdomen and pelvis 10/7:Small bilateral pleural effusion, few bandlike densities, sigmoid diverticulosis loose stool within the colon no obvious inflammatory changes.  Repeat CT abd/pelvis 10/8>>> Sigmoid diverticulosis without inflammation. Calcifications are noted within the pancreas consistent with chronic pancreatitis. The colon is mildly distended and fluid-filled. The terminal leum demonstrates circumferential wall thickening concerning for enteritis or  other inflammation. There is mild dilatation of more proximal small bowel with fecalization concerning for some degree of obstruction. Potentially these findings may represent Crohn's Disease.  10/9 head CT >> Multifocal ischemic infarcts involving the right frontal, parietal, and occipital regions, left frontal  lobe, and cerebellum. Remote lacunar infarcts noted involving the bilateral thalami and left basal ganglia. Appearance is relatively stable.  10/8 renal US >> nml  10/8 Echo>>EF 60-65% without evidence of diastolic failure  10/11 EEG>>generalized epileptogenicity as well as severe diffuse encephalopathy. The periodic epileptiform discharges with triphasic morphology are on the ictal-interictal continuum with low potential for seizures. Thee discharges can also be seen due to hyperammonemia, uremia, cefepime toxicity. No seizures were seen during this study.  10/12 MRI brain>>No acute intracranial abnormality identified. Very advanced chronic ischemic disease appears stable since March.  Micro Data:  C. difficile 10/7>> neg GI panel panel 10/7>>>neg COVID-19/flu>> negative BC 10/7>> ng   Antimicrobials:  Piperacillin tazobactam 10/7- 10/13  Interim history/subjective:  Potassium repleted overnight for 3.3 + 9.89 L Na 146, Creatinine 1.19>> continues to improve Platelets are 51,000, HGB 9.9, WBC 10.5 Total bili 6.3>> jaundiced sclera, stool also very jaundiced AST 155 ( 74 on 10/11) ALT 51( 18 on 10/11) Calcium corrects to 10.1 with albumin of 2.1 Weaning on 30%,  12/5, good volumes, resp rate of 18 BP in 140's systolic Ammonia 384 ( 10/14) Awake and alert, following commands   Objective   Blood pressure (!) 143/95, pulse 91, temperature 98.1 F (36.7 C), temperature source Oral, resp. rate (!) 22, height 5' (1.524 m), weight 55.4 kg, SpO2 100 %.    Vent Mode: PSV FiO2 (%):  [30 %] 30 % Set Rate:  [16 bmp] 16 bmp Vt Set:  [360 mL] 360 mL PEEP:  [5 cmH20] 5 cmH20 Pressure Support:  [12 cmH20-14 cmH20] 12 cmH20   Intake/Output Summary (Last 24 hours) at 09/03/2020 5364 Last data filed at 09/03/2020 0700 Gross per 24 hour  Intake 2411.81 ml  Output 1470 ml  Net 941.81 ml   Filed Weights   08/27/20 0316 08/28/20 0414 08/29/20 0500  Weight: 55.5 kg 55.5 kg 55.4 kg    General:  Older female in NAD, intubated on mechanical ventilation, awake and alert HEENT: MM pink/moist, pupils 3/reactive, scleral jaundice, ETT/ OGT Neuro: Eyes open, follows simple commands- nods appriopately, sticks tongue out, lightly squeezes LUE, wiggles toes R>L, no gross movement/ or to gravity in extremities CV: rr, NSR, no murmur, rub or gallop PULM:  Currently on PSV 12/5, 30%, bibasilar rales, no wheeze noted,  Diminished per bases GI: soft, bs +, flexiseal (~700 ml/ 24hr), foley , stool mustard yellow Extremities: warm/dry, anasarca 1-2+ Skin: no rashes, healed abrasions to right arm, fluid blister to left hand, some skin breakdown in perineum   RUE PICC wnl     Resolved Hospital Problem list   AGMA  Assessment & Plan:  64 y.o. F who presented with diarrhea, weakness and possible sepsis who had seizure-like activity and shock. No clear source of sepsis, now off pressors. No acute CVA on CT.  Transferred to [facility] for Neuro eval and EEG.    Complex partial seizure, known seizure disorder   Requiring intubation for airway protection, No further seizures on EEG. MRI without acute findings  P: Neuro following AED per neurology- keppra, oxcarbazepine  Seizure precautions Neuro checks q 4 Ativan prn seizure Minimizing sedation  SBP goal < 140 per neuro  Acute respiratory insufficiency related to inability  to protect airway due to above P:  Full MV support, PRVC 7 cc/kg Weaning daily, still requiring 12/5 and mental status has been a barrier to extubation; but this is improving   Titrate Vent setting to maintain SpO2 greater than or equal to 90% VAP bundle/ PPI Ongoing pulmonary hygiene Intermittent CXR Lasix w/ aggressive KCL replete  Acute metabolic encephalopathy, Hyperammonemia +/- postictal  Superimposed on prior CVA likely contributing to expressive aphasia along with hepatic encephalopathy.  Head CT and MRI negative for acute findings - given increasing  ammonia levels, valproic acid d/c 10/10-> Keppra - off propofol  P: Minimize sedation- prn fentanyl for RASS goal 0/-1 Continue lactulose and rifaximin (added 10/13) Trend  ammonia level >> they are down trending  Shock w/ lactic acidosis- resolved  Lactic acid peaked on 7.5 on 10/7, downtrend to 2.3 the next day, Now off pressors, unclear source of infection, initially presented with diarrhea and concern for enteritis vs crohn's on CT. Now with formed stools and negative C. Diff and GI panel.  7 day course of Zosyn completed P: Stop stress dose steroids  D/c aline (off pressors since 10/11)  AKI, Hypokalemia  Anasarca  Mild hypernatremia Possibly secondary to ATN in the setting of shock Renal US was WNL, urine studies consistent with pre-renal cause Slowly improving sCr, UOP remains 13 ml/kg today  P: S/p KCL replete ( meq total) Check mag this am  Recheck K at 1400, needs diuresis but need  K better first  Continue  free water , will need to increase if we give lasix today Trend renal indices/ strict I/O's  Presenting Diarrhea with likely Enteritis, possible chronic IBD, Cirrhosis CT suggests chronic pancreatitis/recurrent enteritis on CT, Prior EGD 2019 as shown duodenal ulcers, Prior colonoscopy 2019 showed sigmoid diverticulosis. Neg C. Diff and GI pathogen panel -diarrhea had resolved prior to initiating Lactulose -Appreciate GI input, once stabilized would benefit from colonoscopy,  -CT abd/pelvis without hepatic abnormality, however cirrhosis suspected clinically with mild ascites, elevated LFT's and bili and hyperammonemia P: D/c daily miralax-> change to prn  Check LFT in am  Will add home dose  Creon   Coagulopathy, Thrombocytopenia Thrombocytopenia appears chronic at least 6 months on chart review, possibly secondary to cirrhosis. Was on apixaban for afib, Got vitamin K 10/7 - INR 1.4 oin 10/13 P: Smear pending, consider MAHA/TMA, haptoglobin low>> microcytic  anemia Change protonix to pepcid Trend CBC/ intermittent INR  Anemia of unclear etiology- Looks like baseline around 7.4 to 8, down to 5.9-->2U PRBC --> 9.8 Now stable P: CBC in am, no signs of bleeding Haptoglobin low, though LDH normal follow peripheral smear for schistocytes  Hyperglycemia  -SSI, change to very sensitive to sensitive as CBGs running > 180  Nutrition -TF - Will add home Creon   HTN P:  Continue Norvasc and metoprolol w/ prn labetalol for SBP goal < 140 per neuro  Best practice:  Diet: NPO/ TF  Pain/Anxiety/Delirium protocol (if indicated): prn Fentanyl VAP protocol (if indicated): HOB 30 degrees, suction prn DVT prophylaxis: SCD's given thrombocytopenia GI prophylaxis: pepcid Glucose control: SSI Mobility: Bedrest Code Status: Full Family Communication: pending 10/14 Disposition: ICU    CRITICAL CARE Performed by: Bevelyn Ngo   Total critical care time: 40 minutes  Critical care time was exclusive of separately billable procedures and treating other patients.  Critical care was necessary to treat or prevent imminent or life-threatening deterioration.  Critical care was time spent personally by me on the following  activities: development of treatment plan with patient and/or surrogate as well as nursing, discussions with consultants, evaluation of patient's response to treatment, examination of patient, obtaining history from patient or surrogate, ordering and performing treatments and interventions, ordering and review of laboratory studies, ordering and review of radiographic studies, pulse oximetry and re-evaluation of patient's condition.   Bevelyn Ngo, MSN, AGACNP-BC [facility] See Amion for personal pager PCCM on call pager [phone] [facility] 09/03/2020, 8:07 AM

## 2020-09-03 NOTE — Plan of Care (Signed)
  Problem: Clinical Measurements: Goal: Respiratory complications will improve Outcome: Progressing   Problem: Nutrition: Goal: Adequate nutrition will be maintained Outcome: Progressing   Problem: Elimination: Goal: Will not experience complications related to urinary retention Outcome: Progressing   Problem: Safety: Goal: Ability to remain free from injury will improve Outcome: Progressing   Problem: Fluid Volume: Goal: Hemodynamic stability will improve Outcome: Progressing   Problem: Respiratory: Goal: Ability to maintain adequate ventilation will improve Outcome: Progressing

## 2020-09-03 NOTE — Progress Notes (Signed)
Subjective: No acute events overnight.  No clinical seizures per RN.  ROS: Unable to obtain due to poor mental status  Examination  Vital signs in last 24 hours: Temp:  [97 F (36.1 C)-98.1 F (36.7 C)] 97 F (36.1 C) (10/15 0800) Pulse Rate:  [77-102] 98 (10/15 1000) Resp:  [14-26] 21 (10/15 1000) BP: (122-149)/(84-116) 122/111 (10/15 1000) SpO2:  [100 %] 100 % (10/15 1000) Arterial Line BP: (141-168)/(86-106) 144/86 (10/15 0400) FiO2 (%):  [30 %] 30 % (10/15 0736)  General: lying in bed,not in apparent distress CVS: pulse-normal rate and rhythm RS: breathing comfortably,intubated Extremities: normal,warm Neuro:Opens eyes toverbalstimuli, follows simple one-step command, PERRLA, moves all 4 extremities spontaneously  Basic Metabolic Panel: Recent Labs  Lab 08/29/20 0559 08/29/20 1147 08/30/20 0554 08/30/20 1255 08/31/20 0558 08/31/20 0558 09/01/20 0549 09/01/20 0549 09/01/20 0742 09/01/20 0742 09/01/20 1618 09/01/20 1618 09/02/20 0515 09/02/20 1527 09/03/20 0411  NA  --    < > 138   < > 138   < > 143   < > 140  --  143  --  146* 147* 146*  K  --    < > 2.6*   < > 2.8*   < > QUESTIONABLE RESULTS, RECOMMEND RECOLLECT TO VERIFY   < > <2.0*  --  2.3*  --  2.9* 3.6 3.3*  CL  --    < > 101   < > 101   < > 106   < > 104  --  109  --  111 114* 114*  CO2  --    < > 21*   < > 21*   < > 22   < > 21*  --  21*  --  22 22 22   GLUCOSE  --    < > 114*   < > 154*   < > 181*   < > 194*  --  219*  --  213* 133* 172*  BUN  --    < > 14   < > 18   < > 21   < > 21  --  23  --  26* 26* 27*  CREATININE  --    < > 2.46*   < > 2.39*   < > 1.90*   < > 1.93*  --  1.82*  --  1.66* 1.39* 1.19*  CALCIUM  --    < > 7.5*   < > 8.1*   < > 8.5*   < > 8.4*   < > 8.4*   < > 8.8* 8.6* 8.6*  MG 2.0  --  2.0  --  1.9  --  1.9  --  1.8  --   --   --  2.5*  --   --   PHOS 4.7*  --  5.7*  --  5.1*  --  4.3  --  4.4  --   --   --   --   --   --    < > = values in this interval not displayed.     CBC: Recent Labs  Lab 08/29/20 0559 08/29/20 1652 08/30/20 0322 08/30/20 0554 08/31/20 0558 09/01/20 0549 09/03/20 0411  WBC 7.1  --   --  5.2 6.9 8.1 10.5  NEUTROABS  --   --   --  4.5  --   --   --   HGB 9.8*   < > 11.2* 10.1* 10.5* 10.3* 9.9*  HCT 27.6*   < >  33.0* 28.7* 30.3* 29.2* 28.6*  MCV 76.9*  --   --  76.7* 77.7* 77.0* 76.9*  PLT 59*  --   --  49* 54* 57* 51*   < > = values in this interval not displayed.     Coagulation Studies: Recent Labs    09/01/20 0549  LABPROT 16.6*  INR 1.4*    Imaging No new brain imaging overnight.  ASSESSMENT AND PLAN: 64 year old female with chronic infarcts and epilepsy who was admitted for sepsis and was noted to have focal seizures.  Chronic infarcts Epilepsy with breakthrough seizures Acute encephalopathy, likely toxic-metabolic, infectious Hyperammonemia Hypertension -Acute encephalopathy likely secondary to hyperammonemia, postictal state, cirrhosis, worsening renal function and possible IBD -EEG finding most likely related to aforementioned systemic comorbidities. -Even though it appears that patient is not having seizures at this point, hyperammonemia is neurotoxic and can definitely precipitate seizures in patient who is already epileptic.  Recommendations -Continuereduce Keppra to 500mg  twice daily due to poor renal functionand oxcarbazepine 150 mg twice daily -  Any worsening of mentation or clinical seizure-like activity should be notified to neurology. -Can increase oxcarbazepine if patient has seizure recurrence -Blood pressure management with goal normotension -Seizure precautions -As needed IV Ativan 2 mg for clinical seizure-like activity -Management of rest of comorbidities per primary team  I have spent a total of 25 minuteswith the patient reviewing hospitalnotes,  test results, labs and examining the patient as well as establishing an assessment and plan that was discussed personally with  the patient's RN.>50% of time was spent in direct patient care.    Epilepsy [facility] For questions after 5pm please refer to AMION to reach the Neurologist on call

## 2020-09-04 ENCOUNTER — Inpatient Hospital Stay (HOSPITAL_COMMUNITY): Payer: Medicaid Other

## 2020-09-04 DIAGNOSIS — J9611 Chronic respiratory failure with hypoxia: Secondary | ICD-10-CM | POA: Diagnosis not present

## 2020-09-04 LAB — COMPREHENSIVE METABOLIC PANEL
ALT: 61 U/L — ABNORMAL HIGH (ref 0–44)
AST: 162 U/L — ABNORMAL HIGH (ref 15–41)
Albumin: 2.1 g/dL — ABNORMAL LOW (ref 3.5–5.0)
Alkaline Phosphatase: 652 U/L — ABNORMAL HIGH (ref 38–126)
Anion gap: 9 (ref 5–15)
BUN: 25 mg/dL — ABNORMAL HIGH (ref 8–23)
CO2: 24 mmol/L (ref 22–32)
Calcium: 8.4 mg/dL — ABNORMAL LOW (ref 8.9–10.3)
Chloride: 112 mmol/L — ABNORMAL HIGH (ref 98–111)
Creatinine, Ser: 0.87 mg/dL (ref 0.44–1.00)
GFR, Estimated: >60 mL/min (ref >60)
Glucose, Bld: 145 mg/dL — ABNORMAL HIGH (ref 70–99)
Potassium: 3.9 mmol/L (ref 3.5–5.1)
Sodium: 145 mmol/L (ref 135–145)
Total Bilirubin: 6.1 mg/dL — ABNORMAL HIGH (ref 0.3–1.2)
Total Protein: 5.2 g/dL — ABNORMAL LOW (ref 6.5–8.1)

## 2020-09-04 LAB — GLUCOSE, CAPILLARY
Glucose-Capillary: 131 mg/dL — ABNORMAL HIGH (ref 70–99)
Glucose-Capillary: 124 mg/dL — ABNORMAL HIGH (ref 70–99)
Glucose-Capillary: 115 mg/dL — ABNORMAL HIGH (ref 70–99)
Glucose-Capillary: 142 mg/dL — ABNORMAL HIGH (ref 70–99)
Glucose-Capillary: 78 mg/dL (ref 70–99)
Glucose-Capillary: 117 mg/dL — ABNORMAL HIGH (ref 70–99)

## 2020-09-04 LAB — CBC
HCT: 26.4 % — ABNORMAL LOW (ref 36.0–46.0)
Hemoglobin: 9 g/dL — ABNORMAL LOW (ref 12.0–15.0)
MCH: 26.5 pg (ref 26.0–34.0)
MCHC: 34.1 g/dL (ref 30.0–36.0)
MCV: 77.6 fL — ABNORMAL LOW (ref 80.0–100.0)
Platelets: 56 10*3/uL — ABNORMAL LOW (ref 150–400)
RBC: 3.4 MIL/uL — ABNORMAL LOW (ref 3.87–5.11)
RDW: 20.7 % — ABNORMAL HIGH (ref 11.5–15.5)
WBC: 10.2 10*3/uL (ref 4.0–10.5)
nRBC: 0.3 % — ABNORMAL HIGH (ref 0.0–0.2)

## 2020-09-04 LAB — PROTIME-INR
INR: 1.3 — ABNORMAL HIGH (ref 0.8–1.2)
Prothrombin Time: 15.2 seconds (ref 11.4–15.2)

## 2020-09-04 LAB — MAGNESIUM: Magnesium: 1.8 mg/dL (ref 1.7–2.4)

## 2020-09-04 LAB — AMMONIA: Ammonia: 94 umol/L — ABNORMAL HIGH (ref 9–35)

## 2020-09-04 MED ORDER — CHLORHEXIDINE GLUCONATE 0.12 % MT SOLN
15.0000 mL | Freq: Two times a day (BID) | OROMUCOSAL | Status: DC
  Administered 2020-09-04 – 2020-09-10 (×11): 15 mL via OROMUCOSAL
  Filled 2020-09-04 (×11): qty 15

## 2020-09-04 MED ORDER — NYSTATIN 100000 UNIT/ML MT SUSP
5.0000 mL | Freq: Four times a day (QID) | OROMUCOSAL | Status: DC
  Administered 2020-09-05 – 2020-09-09 (×18): 500000 [IU] via ORAL
  Filled 2020-09-04 (×18): qty 5

## 2020-09-04 MED ORDER — FAMOTIDINE 40 MG/5ML PO SUSR
20.0000 mg | Freq: Two times a day (BID) | ORAL | Status: DC
  Administered 2020-09-05: 20 mg via ORAL
  Filled 2020-09-04: qty 2.5

## 2020-09-04 MED ORDER — ORAL CARE MOUTH RINSE
15.0000 mL | Freq: Two times a day (BID) | OROMUCOSAL | Status: DC
  Administered 2020-09-05 – 2020-09-09 (×9): 15 mL via OROMUCOSAL

## 2020-09-04 MED ORDER — FUROSEMIDE 10 MG/ML IJ SOLN
40.0000 mg | Freq: Once | INTRAMUSCULAR | Status: AC
  Administered 2020-09-04: 40 mg via INTRAVENOUS
  Filled 2020-09-04: qty 4

## 2020-09-04 MED ORDER — LACTULOSE 10 GM/15ML PO SOLN
20.0000 g | Freq: Two times a day (BID) | ORAL | Status: DC
  Administered 2020-09-05 – 2020-09-09 (×10): 20 g via ORAL
  Filled 2020-09-04 (×10): qty 30

## 2020-09-04 MED ORDER — AMLODIPINE BESYLATE 10 MG PO TABS
10.0000 mg | ORAL_TABLET | Freq: Every day | ORAL | Status: DC
  Administered 2020-09-05 – 2020-09-10 (×6): 10 mg via ORAL
  Filled 2020-09-04 (×6): qty 1

## 2020-09-04 MED ORDER — OXCARBAZEPINE 150 MG PO TABS
150.0000 mg | ORAL_TABLET | Freq: Two times a day (BID) | ORAL | Status: DC
  Administered 2020-09-05 – 2020-09-10 (×11): 150 mg via ORAL
  Filled 2020-09-04 (×13): qty 1

## 2020-09-04 MED ORDER — RIFAXIMIN 550 MG PO TABS
550.0000 mg | ORAL_TABLET | Freq: Two times a day (BID) | ORAL | Status: DC
  Administered 2020-09-05 – 2020-09-10 (×11): 550 mg via ORAL
  Filled 2020-09-04 (×12): qty 1

## 2020-09-04 MED ORDER — METOPROLOL TARTRATE 25 MG/10 ML ORAL SUSPENSION
12.5000 mg | Freq: Two times a day (BID) | ORAL | Status: DC
  Administered 2020-09-05 – 2020-09-08 (×8): 12.5 mg via ORAL
  Filled 2020-09-04 (×2): qty 5
  Filled 2020-09-04: qty 10
  Filled 2020-09-04 (×3): qty 5
  Filled 2020-09-04: qty 10
  Filled 2020-09-04: qty 5
  Filled 2020-09-04: qty 10

## 2020-09-04 NOTE — Progress Notes (Signed)
NAMETiffaney [name], MRN:  [id number], DOB:  06/19/1956, LOS: 9 ADMISSION DATE:  08/26/2020, CONSULTATION DATE: 08/26/2020 REFERRING MD: Dr. Lockie Mola, CHIEF COMPLAINT: Hypertension  Brief History   Amber [name] is received in transfer from [facility] in anticipation of continuous EEG monitoring. Briefly she was admitted there on 10/7 complaining of diarrhea and weakness.  She was found to have an elevated lactate and elevated white count.  Stool studies were remarkably unremarkable including a stool guaiac which was negative.  CT scan of the abdomen was remarkable for pancreatic calcifications and thickening of the bowel wall most conspicuously in the ileum.  Blood and urine cultures obtained on admission were negative.  She was transiently supported with alpha agents and placed on stress dose steroids as she is chronically on no home steroids for asthma.  Her lactic acidosis has cleared and she is now off of pressors however on 10/9 she developed drooling and a right eye gaze and was felt to be having partial complex seizures.  Mental  status has not recovered.  CT scan of the head showed an old infarct with no acute changes. There was concern that she was suffering from some clinical status and she was transferred to this hospital for continuous EEG monitoring.  On arrival here she was not protecting her airway and was drooling and she was semiemergently intubated for airway protection.    PMH - CAD, CVA, type 2 diabetes, small bowel obstruction, hypertension, gastroesophageal reflux, seizure.    Past Medical History   Past Medical History:  Diagnosis Date  . ACS (acute coronary syndrome) (HCC) 06/12/2015  . Acute on chronic blood loss anemia 11/25/2017  . Arthritis   . Asthma   . 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 CHF (congestive heart failure) (HCC)   . Chronic pain   . Constipation   . Diabetes mellitus without complication (HCC)   . Duodenitis   .  Embolic stroke (HCC)   . Falls frequently   . Focal seizure (HCC)   . GERD (gastroesophageal reflux disease)   . Hypertension   . Impacted fracture 02/15/2017   right glenoid  . Pneumonia 10/2015  . Seizures (HCC)    last seizure March 2015  . Small bowel obstruction (HCC) 11/25/2017    Significant Hospital Events   ICU admission 10/7   Presented to the ER with diarrhea and weakness. Hypotensive in spite of IVFs so PCCM consulted. CT abd w/out clear inflammatory changes. Elevated lactate. Pressors required.   10/8: vanc added. On 2 pressors. Repeat hgb down to 5.9 no clear evidence of bleeding. 2 units of blood ordered. abd more distended. Repeat CT abd/pelvis ordered. Repeat lipase ordered for "stigmata of recurrent pancreatitis" this was negative.   10/9 Received 12.5 mg of fentanyl around midnight, unresponsive around 4 AM Code stroke called, head CT negative. Witnessed seizure by RN and tele neurologist Given Ativan 3 mg in divided doses and loaded with Keppra  10/10 Transfer to [facility] for EEG and neuro evaluation  Consults:  Pulmonary critical care  Procedures:  10/10 ETT >>  Significant Diagnostic Tests:   CT chest, abdomen and pelvis 10/7:Small bilateral pleural effusion, few bandlike densities, sigmoid diverticulosis loose stool within the colon no obvious inflammatory changes.  Repeat CT abd/pelvis 10/8>>> Sigmoid diverticulosis without inflammation. Calcifications are noted within the pancreas consistent with chronic pancreatitis. The colon is mildly distended and fluid-filled. The terminal leum demonstrates circumferential wall thickening concerning for enteritis or  other inflammation. There is mild dilatation of more proximal small bowel with fecalization concerning for some degree of obstruction. Potentially these findings may represent Crohn's Disease.  10/9 head CT >> Multifocal ischemic infarcts involving the right frontal, parietal, and occipital regions, left frontal  lobe, and cerebellum. Remote lacunar infarcts noted involving the bilateral thalami and left basal ganglia. Appearance is relatively stable.  10/8 renal US >> nml  10/8 Echo>>EF 60-65% without evidence of diastolic failure  10/11 EEG>>generalized epileptogenicity as well as severe diffuse encephalopathy. The periodic epileptiform discharges with triphasic morphology are on the ictal-interictal continuum with low potential for seizures. Thee discharges can also be seen due to hyperammonemia, uremia, cefepime toxicity. No seizures were seen during this study.  10/12 MRI brain>>No acute intracranial abnormality identified. Very advanced chronic ischemic disease appears stable since March.  Micro Data:  C. difficile 10/7>> neg GI panel panel 10/7>>>neg COVID-19/flu>> negative BC 10/7>> ng   Antimicrobials:  Piperacillin tazobactam 10/7- 10/13  Interim history/subjective:   No sedating drips, more awake today Potassium responded to replacement, magnesium 1.8 AST/ALT remain elevated, ammonia improved to 94 Tolerating PSV 8 this morning Net negative last 24 hours   Objective   Blood pressure (!) 142/96, pulse (!) 102, temperature 99.5 F (37.5 C), temperature source Axillary, resp. rate 14, height 5' (1.524 m), weight 64 kg, SpO2 100 %.    Vent Mode: PSV FiO2 (%):  [30 %] 30 % Set Rate:  [16 bmp] 16 bmp Vt Set:  [360 mL] 360 mL PEEP:  [5 cmH20] 5 cmH20 Pressure Support:  [12 cmH20] 12 cmH20 Plateau Pressure:  [25 cmH20] 25 cmH20   Intake/Output Summary (Last 24 hours) at 09/04/2020 1321 Last data filed at 09/04/2020 0900 Gross per 24 hour  Intake 1500 ml  Output 3950 ml  Net -2450 ml   Filed Weights   08/28/20 0414 08/29/20 0500 09/04/20 0500  Weight: 55.5 kg 55.4 kg 64 kg   General: Ill-appearing elderly woman, ventilated HEENT: ET tube in place, oropharynx moist, dark scleral icterus Neuro: Awake, nods to questions, follows commands, globally weak. Was not able to  cough on request but strong cough when suctioned CV: Regular, distant, no murmur PULM: Decreased at both bases, soft bilateral inspiratory crackles, no wheeze GI: Nondistended, positive bowel sounds Extremities: 2+ lower extremity edema Skin: No rash  RUE PICC wnl      Resolved Hospital Problem list   AGMA  Assessment & Plan:  64 y.o. F who presented with diarrhea, weakness and possible sepsis who had seizure-like activity and shock. No clear source of sepsis but question colitis, now off pressors. No acute CVA on CT.  Transferred to [facility] for Neuro eval and EEG.    Complex partial seizure, known seizure disorder   Requiring intubation for airway protection, No further seizures on EEG. MRI without acute findings  P: Appreciate neurology assistance, recommendations, continue Keppra, oxcarbazepine Seizure precautions Ativan if needed Minimize sedating medications  Acute respiratory insufficiency related to inability to protect airway due to above P:  Tolerating PSV currently. She is much more awake today and my concern about airway protection is not as high. I think she is or is a trial of extubation today VAP prevention order set Pulmonary hygiene Intermittent chest x-ray Diuresis as she can tolerate  Acute metabolic encephalopathy, multifactorial including hyperammonemia +/- postictal. Improved Head CT and MRI negative for acute findings - given increasing ammonia levels, valproic acid d/c 10/10-> Keppra - off propofol  P: Valproic acid  changed to Keppra given her transaminitis, hyperbilirubinemia Minimize all sedation Continue lactulose and rifaximin as ordered Follow ammonia trend every few days  Shock w/ lactic acidosis- resolved  Lactic acid peaked on 7.5 on 10/7, downtrend to 2.3 the next day, Now off pressors, unclear source of infection, initially presented with diarrhea and concern for enteritis vs crohn's on CT. Now with formed stools and negative C. Diff and GI  panel.  7 day course of Zosyn completed P: Off stress dose steroids Follow hemodynamics  AKI, Hypokalemia, resolved Hypomagnesemia Anasarca  Mild hypernatremia Renal US was WNL, urine studies consistent with pre-renal cause P: Follow urine output, BMP Replace potassium as indicated Replace magnesium 10/16 Free water, strict I/O  Presenting Diarrhea with likely Enteritis, possible chronic IBD  Cirrhosis CT suggests chronic pancreatitis/recurrent enteritis on CT, Prior EGD 2019 as shown duodenal ulcers, Prior colonoscopy 2019 showed sigmoid diverticulosis. Neg C. Diff and GI pathogen panel -diarrhea had resolved prior to initiating Lactulose -Appreciate GI input, once stabilized would benefit from colonoscopy,  -CT abd/pelvis without hepatic abnormality, however cirrhosis suspected clinically with mild ascites, elevated LFT's and bili and hyperammonemia P: Lactulose as ordered Continue Creon Follow intermittent LFT and ammonia  Coagulopathy, Thrombocytopenia Thrombocytopenia appears chronic at least 6 months on chart review, possibly secondary to cirrhosis. Was on apixaban for afib, Got vitamin K 10/7 - INR 1.4 oin 10/13 P: Follow platelets, INR  Anemia of unclear etiology- Looks like baseline around 7.4 to 8, down to 5.9-->2U PRBC --> 9.8 Now stable P: Follow CBC No clear evidence for hemolysis  Hyperglycemia  Sliding-scale insulin as ordered  Nutrition Tube feeding. If she is extubated we will assess for diet Continue home Creon  HTN P:  Hold Norvasc, metoprolol Labetalol available if needed  Best practice:  Diet: NPO/ TF  Pain/Anxiety/Delirium protocol (if indicated): prn Fentanyl VAP protocol (if indicated): HOB 30 degrees, suction prn DVT prophylaxis: SCD's given thrombocytopenia GI prophylaxis: pepcid Glucose control: SSI Mobility: Bedrest Code Status: Full Family Communication: Spoke with patient's daughter at bedside 10/16 Disposition: ICU     CRITICAL CARE Performed by: Leslye Peer   Total critical care time: 34 minutes  Critical care time was exclusive of separately billable procedures and treating other patients.  Critical care was necessary to treat or prevent imminent or life-threatening deterioration.  Critical care was time spent personally by me on the following activities: development of treatment plan with patient and/or surrogate as well as nursing, discussions with consultants, evaluation of patient's response to treatment, examination of patient, obtaining history from patient or surrogate, ordering and performing treatments and interventions, ordering and review of laboratory studies, ordering and review of radiographic studies, pulse oximetry and re-evaluation of patient's condition.  Levy Pupa, MD, PhD 09/04/2020, 1:30 PM [facility] [phone] or if no answer [phone]

## 2020-09-04 NOTE — Procedures (Signed)
Extubation Procedure Note  Patient Details:   Name: Amber [name] DOB: Jun 20, 1956 MRN: [id number]   Airway Documentation:    Vent end date: 09/04/20 Vent end time: 1450   Evaluation  O2 sats: 100% on 6LNC Complications: None noted. No stridor noted post extubation. Patient tolerated procedure well. Bilateral Breath Sounds: Rhonchi    Georgianna Band D Dollie Mayse 09/04/2020, 4:09 PM

## 2020-09-04 NOTE — Progress Notes (Signed)
SLP Cancellation Note  Patient Details Name: Amber [name] MRN: [id number] DOB: 1956-03-14   Cancelled treatment:        Attempted to see pt for clinical swallowing evaluation.  Pt extubated 10 minutes previously and not yet medically ready for PO trials.  Given prolonged intubation (6day) SLP will defer at this time and reattempt next date or as schedule permits.   Kerrie Pleasure, MA, CCC-SLP Acute Rehabilitation Services Office: [phone]; Pager [phone]): [phone] 09/04/2020, 3:27 PM

## 2020-09-04 NOTE — Plan of Care (Signed)
  Problem: Clinical Measurements: Goal: Will remain free from infection Outcome: Progressing Goal: Respiratory complications will improve Outcome: Progressing   Problem: Safety: Goal: Ability to remain free from injury will improve Outcome: Progressing   Problem: Fluid Volume: Goal: Hemodynamic stability will improve Outcome: Progressing

## 2020-09-05 DIAGNOSIS — J9601 Acute respiratory failure with hypoxia: Secondary | ICD-10-CM | POA: Diagnosis not present

## 2020-09-05 LAB — COMPREHENSIVE METABOLIC PANEL
ALT: 64 U/L — ABNORMAL HIGH (ref 0–44)
AST: 162 U/L — ABNORMAL HIGH (ref 15–41)
Albumin: 2 g/dL — ABNORMAL LOW (ref 3.5–5.0)
Alkaline Phosphatase: 600 U/L — ABNORMAL HIGH (ref 38–126)
Anion gap: 9 (ref 5–15)
BUN: 20 mg/dL (ref 8–23)
CO2: 27 mmol/L (ref 22–32)
Calcium: 8.1 mg/dL — ABNORMAL LOW (ref 8.9–10.3)
Chloride: 108 mmol/L (ref 98–111)
Creatinine, Ser: 0.73 mg/dL (ref 0.44–1.00)
GFR, Estimated: >60 mL/min (ref >60)
Glucose, Bld: 87 mg/dL (ref 70–99)
Potassium: 3.3 mmol/L — ABNORMAL LOW (ref 3.5–5.1)
Sodium: 144 mmol/L (ref 135–145)
Total Bilirubin: 8.1 mg/dL — ABNORMAL HIGH (ref 0.3–1.2)
Total Protein: 5.3 g/dL — ABNORMAL LOW (ref 6.5–8.1)

## 2020-09-05 LAB — CBC
HCT: 28 % — ABNORMAL LOW (ref 36.0–46.0)
Hemoglobin: 9.8 g/dL — ABNORMAL LOW (ref 12.0–15.0)
MCH: 26.9 pg (ref 26.0–34.0)
MCHC: 35 g/dL (ref 30.0–36.0)
MCV: 76.9 fL — ABNORMAL LOW (ref 80.0–100.0)
Platelets: 75 10*3/uL — ABNORMAL LOW (ref 150–400)
RBC: 3.64 MIL/uL — ABNORMAL LOW (ref 3.87–5.11)
RDW: 21.3 % — ABNORMAL HIGH (ref 11.5–15.5)
WBC: 9.7 10*3/uL (ref 4.0–10.5)
nRBC: 0.3 % — ABNORMAL HIGH (ref 0.0–0.2)

## 2020-09-05 LAB — GLUCOSE, CAPILLARY
Glucose-Capillary: 91 mg/dL (ref 70–99)
Glucose-Capillary: 90 mg/dL (ref 70–99)
Glucose-Capillary: 86 mg/dL (ref 70–99)
Glucose-Capillary: 125 mg/dL — ABNORMAL HIGH (ref 70–99)
Glucose-Capillary: 136 mg/dL — ABNORMAL HIGH (ref 70–99)
Glucose-Capillary: 88 mg/dL (ref 70–99)

## 2020-09-05 LAB — MAGNESIUM: Magnesium: 1.5 mg/dL — ABNORMAL LOW (ref 1.7–2.4)

## 2020-09-05 MED ORDER — ACETAMINOPHEN 325 MG PO TABS
650.0000 mg | ORAL_TABLET | Freq: Four times a day (QID) | ORAL | Status: DC | PRN
  Administered 2020-09-05 – 2020-09-07 (×6): 650 mg via ORAL
  Filled 2020-09-05 (×6): qty 2

## 2020-09-05 MED ORDER — POTASSIUM CHLORIDE 10 MEQ/50ML IV SOLN
10.0000 meq | INTRAVENOUS | Status: AC
  Administered 2020-09-05 (×4): 10 meq via INTRAVENOUS
  Filled 2020-09-05 (×4): qty 50

## 2020-09-05 MED ORDER — FAMOTIDINE 20 MG PO TABS
20.0000 mg | ORAL_TABLET | Freq: Two times a day (BID) | ORAL | Status: DC
  Administered 2020-09-05 – 2020-09-10 (×10): 20 mg via ORAL
  Filled 2020-09-05 (×10): qty 1

## 2020-09-05 MED ORDER — LEVETIRACETAM 500 MG PO TABS
500.0000 mg | ORAL_TABLET | Freq: Two times a day (BID) | ORAL | Status: DC
  Administered 2020-09-05 – 2020-09-10 (×10): 500 mg via ORAL
  Filled 2020-09-05 (×10): qty 1

## 2020-09-05 MED ORDER — FUROSEMIDE 10 MG/ML IJ SOLN
40.0000 mg | Freq: Once | INTRAMUSCULAR | Status: AC
  Administered 2020-09-05: 40 mg via INTRAVENOUS
  Filled 2020-09-05: qty 4

## 2020-09-05 MED ORDER — POTASSIUM CHLORIDE CRYS ER 20 MEQ PO TBCR
20.0000 meq | EXTENDED_RELEASE_TABLET | ORAL | Status: AC
  Administered 2020-09-05 (×2): 20 meq via ORAL
  Filled 2020-09-05 (×2): qty 1

## 2020-09-05 MED ORDER — MAGNESIUM SULFATE 4 GM/100ML IV SOLN
4.0000 g | Freq: Once | INTRAVENOUS | Status: AC
  Administered 2020-09-05: 4 g via INTRAVENOUS
  Filled 2020-09-05: qty 100

## 2020-09-05 NOTE — Progress Notes (Signed)
NAMEVieno [name], MRN:  [id number], DOB:  12-21-1955, LOS: 10 ADMISSION DATE:  08/26/2020, CONSULTATION DATE: 08/26/2020 REFERRING MD: Dr. Lockie Mola, CHIEF COMPLAINT: Hypertension  Brief History   Amber [name] is received in transfer from [facility] in anticipation of continuous EEG monitoring. Briefly she was admitted there on 10/7 complaining of diarrhea and weakness.  She was found to have an elevated lactate and elevated white count.  Stool studies were remarkably unremarkable including a stool guaiac which was negative.  CT scan of the abdomen was remarkable for pancreatic calcifications and thickening of the bowel wall most conspicuously in the ileum.  Blood and urine cultures obtained on admission were negative.  She was transiently supported with alpha agents and placed on stress dose steroids as she is chronically on no home steroids for asthma.  Her lactic acidosis has cleared and she is now off of pressors however on 10/9 she developed drooling and a right eye gaze and was felt to be having partial complex seizures.  Mental  status has not recovered.  CT scan of the head showed an old infarct with no acute changes. There was concern that she was suffering from some clinical status and she was transferred to this hospital for continuous EEG monitoring.  On arrival here she was not protecting her airway and was drooling and she was semiemergently intubated for airway protection.    PMH - CAD, CVA, type 2 diabetes, small bowel obstruction, hypertension, gastroesophageal reflux, seizure.    Past Medical History   Past Medical History:  Diagnosis Date  . ACS (acute coronary syndrome) (HCC) 06/12/2015  . Acute on chronic blood loss anemia 11/25/2017  . Arthritis   . Asthma   . 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 CHF (congestive heart failure) (HCC)   . Chronic pain   . Constipation   . Diabetes mellitus without complication (HCC)   . Duodenitis    . Embolic stroke (HCC)   . Falls frequently   . Focal seizure (HCC)   . GERD (gastroesophageal reflux disease)   . Hypertension   . Impacted fracture 02/15/2017   right glenoid  . Pneumonia 10/2015  . Seizures (HCC)    last seizure March 2015  . Small bowel obstruction (HCC) 11/25/2017    Significant Hospital Events   ICU admission 10/7   Presented to the ER with diarrhea and weakness. Hypotensive in spite of IVFs so PCCM consulted. CT abd w/out clear inflammatory changes. Elevated lactate. Pressors required.   10/8: vanc added. On 2 pressors. Repeat hgb down to 5.9 no clear evidence of bleeding. 2 units of blood ordered. abd more distended. Repeat CT abd/pelvis ordered. Repeat lipase ordered for "stigmata of recurrent pancreatitis" this was negative.   10/9 Received 12.5 mg of fentanyl around midnight, unresponsive around 4 AM Code stroke called, head CT negative. Witnessed seizure by RN and tele neurologist Given Ativan 3 mg in divided doses and loaded with Keppra  10/10 Transfer to [facility] for EEG and neuro evaluation  Consults:  Pulmonary critical care  Procedures:  10/10 ETT >> 10/16  Significant Diagnostic Tests:   CT chest, abdomen and pelvis 10/7:Small bilateral pleural effusion, few bandlike densities, sigmoid diverticulosis loose stool within the colon no obvious inflammatory changes.  Repeat CT abd/pelvis 10/8>>> Sigmoid diverticulosis without inflammation. Calcifications are noted within the pancreas consistent with chronic pancreatitis. The colon is mildly distended and fluid-filled. The terminal leum demonstrates circumferential wall thickening concerning for enteritis  or other inflammation. There is mild dilatation of more proximal small bowel with fecalization concerning for some degree of obstruction. Potentially these findings may represent Crohn's Disease.  10/9 head CT >> Multifocal ischemic infarcts involving the right frontal, parietal, and occipital regions, left  frontal lobe, and cerebellum. Remote lacunar infarcts noted involving the bilateral thalami and left basal ganglia. Appearance is relatively stable.  10/8 renal US >> nml  10/8 Echo>>EF 60-65% without evidence of diastolic failure  10/11 EEG>>generalized epileptogenicity as well as severe diffuse encephalopathy. The periodic epileptiform discharges with triphasic morphology are on the ictal-interictal continuum with low potential for seizures. Thee discharges can also be seen due to hyperammonemia, uremia, cefepime toxicity. No seizures were seen during this study.  10/12 MRI brain>>No acute intracranial abnormality identified. Very advanced chronic ischemic disease appears stable since March.  Micro Data:  C. difficile 10/7>> neg GI panel panel 10/7>>>neg COVID-19/flu>> negative BC 10/7>> ng   Antimicrobials:  Piperacillin tazobactam 10/7- 10/13  Interim history/subjective:   Extubated successfully Has been able to take some p.o. meds with applesauce, SLP this morning cleared for dysphagia 1 diet, thin liquids TB increased 8.1, AST and ALT stable I/O+ 4 liters total (improved)  Objective   Blood pressure (!) 137/111, pulse (!) 110, temperature 98.1 F (36.7 C), temperature source Oral, resp. rate 15, height 5' (1.524 m), weight 63.8 kg, SpO2 100 %.        Intake/Output Summary (Last 24 hours) at 09/05/2020 1154 Last data filed at 09/05/2020 1100 Gross per 24 hour  Intake 885 ml  Output 4050 ml  Net -3165 ml   Filed Weights   08/29/20 0500 09/04/20 0500 09/05/20 0500  Weight: 55.4 kg 64 kg 63.8 kg   General: Ill-appearing elderly woman, comfortable HEENT: Some upper airway noise.  Difficulty clearing secretions but no evidence of airway compromise Neuro: Awake, nods to questions, very soft voice, weak cough, able to move upper extremities to command CV: Regular, distant, no murmur PULM: Decreased at both bases, small bilateral spire Tory crackles, no wheezing GI:  Nondistended, positive bowel sounds Extremities: 2+ lower extremity edema Skin: No rash  RUE PICC wnl      Resolved Hospital Problem list   AGMA  Assessment & Plan:  63 y.o. F who presented with diarrhea, weakness and possible sepsis who had seizure-like activity and shock. No clear source of sepsis but question colitis, now off pressors. No acute CVA on CT.  Transferred to [facility] for Neuro eval and EEG.    Complex partial seizure, known seizure disorder   Requiring intubation for airway protection, No further seizures on EEG. MRI without acute findings  P: Appreciate neurology assistance On Keppra, oxcarbazepine Seizure precautions Minimize sedating medications   Acute respiratory insufficiency related to inability to protect airway due to above P:  Extubated successfully. Push pulmonary hygiene.  Cough is weak but does not appear to be at risk for reintubation at this time.  May require a few NTS until cough strength improves Follow intermittent chest x-ray Has benefited from diuresis, continue if she can tolerate   Acute metabolic encephalopathy, multifactorial including hyperammonemia +/- postictal. Improved Head CT and MRI negative for acute findings - given increasing ammonia levels, valproic acid d/c 10/10-> Keppra - off propofol  P: Valproic acid was changed to Keppra given her transaminitis, hyperbilirubinemia.  The hyperbilirubinemia persists with moderate LFT elevation Continue lactulose and rifaximin as ordered Following ammonia intermittently to ensure appropriate trend  Shock w/ lactic acidosis- resolved  Lactic acid peaked on 7.5 on 10/7, downtrend to 2.3 the next day, Now off pressors, unclear source of infection, initially presented with diarrhea and concern for enteritis vs crohn's on CT. Now with formed stools and negative C. Diff and GI panel.  7 day course of Zosyn completed P: Follow hemodynamics off antibiotics, off stress dose steroids  AKI,  Hypokalemia, resolved Hypomagnesemia Anasarca  Mild hypernatremia Renal US was WNL, urine studies consistent with pre-renal cause P: Follow urine output, BMP.  Diuresis if she can tolerate Replace potassium 10/17  Presenting Diarrhea with likely Enteritis, possible chronic IBD  Cirrhosis CT suggests chronic pancreatitis/recurrent enteritis on CT, Prior EGD 2019 as shown duodenal ulcers, Prior colonoscopy 2019 showed sigmoid diverticulosis. Neg C. Diff and GI pathogen panel -diarrhea had resolved prior to initiating Lactulose -Appreciate GI input, once stabilized would benefit from colonoscopy,  -CT abd/pelvis without hepatic abnormality, however cirrhosis suspected clinically with mild ascites, elevated LFT's and bili and hyperammonemia P: Continue lactulose, Creon as ordered Follow intermittent LFT and ammonia  Coagulopathy, Thrombocytopenia Thrombocytopenia appears chronic at least 6 months on chart review, possibly secondary to cirrhosis. Was on apixaban for afib, Got vitamin K 10/7 - INR 1.4 oin 10/13 P: Follow platelets, INR  Anemia of unclear etiology- Looks like baseline around 7.4 to 8, down to 5.9-->2U PRBC --> 9.8 Now stable P: No clear evidence hemolysis, following CBC  Hyperglycemia  Sliding-scale insulin as ordered  Nutrition Dysphagia 1 diet, thin liquids Continue home Creon  HTN P:  Continue to hold Norvasc, metoprolol Labetalol available if needed  Best practice:  Diet: NPO/ TF  Pain/Anxiety/Delirium protocol (if indicated): prn Fentanyl VAP protocol (if indicated): HOB 30 degrees, suction prn DVT prophylaxis: SCD's given thrombocytopenia GI prophylaxis: pepcid Glucose control: SSI Mobility: Bedrest Code Status: Full Family Communication: Spoke with patient's daughter at bedside 10/16 Disposition: Okay to transition to progressive care  We will ask TRH to assume her care as of 10/18.   CRITICAL CARE NA  Levy Pupa, MD, PhD 09/05/2020,  11:54 AM [facility] [phone] or if no answer [phone]

## 2020-09-05 NOTE — Progress Notes (Signed)
PT Cancellation Note  Patient Details Name: Amber [name] MRN: [id number] DOB: 04-17-1956   Cancelled Treatment:    Reason Eval/Treat Not Completed: Fatigue/lethargy limiting ability to participate;Other (comment)  Patient's grandaughter present with pt sleeping soundly. Grandaughter reports she has been hurting a lot and having trouble sleeping. She had pain medicine and is finally resting. Agreed not to wake her. Did discuss home set-up and prior functional status and added this information to PT flowsheet to be available for completing evaluation 10/18.   Jerolyn Center, PT Pager [phone]  Zena Amos 09/05/2020, 5:34 PM

## 2020-09-05 NOTE — Evaluation (Signed)
Clinical/Bedside Swallow Evaluation Patient Details  Name: Amber [name] MRN: [id number] Date of Birth: 09-26-1956  Today's Date: 09/05/2020 Time: SLP Start Time (ACUTE ONLY): 0945 SLP Stop Time (ACUTE ONLY): 0955 SLP Time Calculation (min) (ACUTE ONLY): 10 min  Past Medical History:  Past Medical History:  Diagnosis Date  . ACS (acute coronary syndrome) (HCC) 06/12/2015  . Acute on chronic blood loss anemia 11/25/2017  . Arthritis   . Asthma   . 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 CHF (congestive heart failure) (HCC)   . Chronic pain   . Constipation   . Diabetes mellitus without complication (HCC)   . Duodenitis   . Embolic stroke (HCC)   . Falls frequently   . Focal seizure (HCC)   . GERD (gastroesophageal reflux disease)   . Hypertension   . Impacted fracture 02/15/2017   right glenoid  . Pneumonia 10/2015  . Seizures (HCC)    last seizure March 2015  . Small bowel obstruction (HCC) 11/25/2017   Past Surgical History:  Past Surgical History:  Procedure Laterality Date  . APPENDECTOMY    . BACK SURGERY    . CHOLECYSTECTOMY    . TEE WITHOUT CARDIOVERSION N/A 10/30/2016   Procedure: TRANSESOPHAGEAL ECHOCARDIOGRAM (TEE);  Surgeon: Lars Masson, MD;  Location: [facility] ENDOSCOPY;  Service: Cardiovascular;  Laterality: N/A;   HPI:  64 y.o. F who presented with diarrhea, weakness and possible sepsis who had seizure-like activity and shock. No clear source of sepsis but question colitis, now off pressors. No acute CVA on CT.  Transferred to [facility] for Neuro eval and EEG.  Intubated from 10/10 to 10/16 for airway protection   Assessment / Plan / Recommendation Clinical Impression  Pt demonstrates function consistent with prior documented baseline. She has passed a 3 oz water swallow x2 with no signs of aspiration. She is edentlous and has prolonged mastication with a graham cracker and reuired a liquid wash to fully transit bolus. Suspect pt will be ready  for a dys 2 diet in a day or two, but given her reluctance to eat today and her prolonged oral phase, recommend starting with puree. Will f/u x1 for upgrade. Crush pills.  SLP Visit Diagnosis: Dysphagia, oral phase (R13.11)    Aspiration Risk  Mild aspiration risk    Diet Recommendation Dysphagia 1 (Puree);Thin liquid   Liquid Administration via: Cup;Straw Medication Administration: Crushed with puree Supervision: Patient able to self feed Compensations: Slow rate;Small sips/bites Postural Changes: Seated upright at 90 degrees    Other  Recommendations Oral Care Recommendations: Oral care BID   Follow up Recommendations None      Frequency and Duration            Prognosis Prognosis for Safe Diet Advancement: Good      Swallow Study   General HPI: 64 y.o. F who presented with diarrhea, weakness and possible sepsis who had seizure-like activity and shock. No clear source of sepsis but question colitis, now off pressors. No acute CVA on CT.  Transferred to [facility] for Neuro eval and EEG.  Intubated from 10/10 to 10/16 for airway protection Type of Study: Bedside Swallow Evaluation Previous Swallow Assessment: Pt has multiple evaluations in the past for minor swallowing concerns (difficulty with pills, coughing if drinking too much too fast). Pt typically recommended to consume dys 2/thin with crushed meds Diet Prior to this Study: NPO Temperature Spikes Noted: No Respiratory Status: Nasal cannula History of Recent Intubation: Yes Length of Intubations (  days): 6 days Date extubated: 09/04/20 Behavior/Cognition: Alert;Cooperative Oral Cavity Assessment: Within Functional Limits Oral Care Completed by SLP: No Oral Cavity - Dentition: Edentulous Self-Feeding Abilities: Total assist Patient Positioning: Upright in bed Baseline Vocal Quality: Hoarse Volitional Cough: Strong Volitional Swallow: Able to elicit    Oral/Motor/Sensory Function Overall Oral Motor/Sensory Function: Within  functional limits   Ice Chips     Thin Liquid Thin Liquid: Within functional limits Presentation: Straw    Nectar Thick Nectar Thick Liquid: Not tested   Honey Thick Honey Thick Liquid: Not tested   Puree Puree: Within functional limits   Solid     Solid: Impaired Oral Phase Impairments: Impaired mastication Oral Phase Functional Implications: Impaired mastication;Prolonged oral transit     [name], MA CCC-SLP  Acute Rehabilitation Services Pager [phone] Office [phone]  [name] 09/05/2020,10:00 AM

## 2020-09-05 NOTE — Progress Notes (Signed)
Pharmacy Electrolyte Replacement  Recent Labs:  Recent Labs    09/05/20 0530  K 3.3*  MG 1.5*  CREATININE 0.73    Low Critical Values (K </= 2.5, Phos </= 1, Mg </= 1) Present: None  MD Contacted: Byrum, R  Plan: Mag sulfate 4g IV x 1 KCl PO q4h x 2 + K runs x 4 Recheck K and Mag with AM labs per protocol Watch trend s/p Lasix   Leia Alf, PharmD, BCPS Please check AMION for all [facility] Pharmacy contact numbers Clinical Pharmacist 09/05/2020 12:04 PM

## 2020-09-06 DIAGNOSIS — N179 Acute kidney failure, unspecified: Secondary | ICD-10-CM | POA: Diagnosis not present

## 2020-09-06 DIAGNOSIS — R569 Unspecified convulsions: Secondary | ICD-10-CM | POA: Diagnosis not present

## 2020-09-06 DIAGNOSIS — R579 Shock, unspecified: Secondary | ICD-10-CM | POA: Diagnosis not present

## 2020-09-06 DIAGNOSIS — E876 Hypokalemia: Secondary | ICD-10-CM | POA: Diagnosis not present

## 2020-09-06 DIAGNOSIS — G9340 Encephalopathy, unspecified: Secondary | ICD-10-CM | POA: Diagnosis not present

## 2020-09-06 LAB — BASIC METABOLIC PANEL
Anion gap: 8 (ref 5–15)
BUN: 16 mg/dL (ref 8–23)
CO2: 25 mmol/L (ref 22–32)
Calcium: 7.8 mg/dL — ABNORMAL LOW (ref 8.9–10.3)
Chloride: 109 mmol/L (ref 98–111)
Creatinine, Ser: 0.69 mg/dL (ref 0.44–1.00)
GFR, Estimated: >60 mL/min (ref >60)
Glucose, Bld: 129 mg/dL — ABNORMAL HIGH (ref 70–99)
Potassium: 3.7 mmol/L (ref 3.5–5.1)
Sodium: 142 mmol/L (ref 135–145)

## 2020-09-06 LAB — MAGNESIUM: Magnesium: 2.2 mg/dL (ref 1.7–2.4)

## 2020-09-06 LAB — GLUCOSE, CAPILLARY
Glucose-Capillary: 66 mg/dL — ABNORMAL LOW (ref 70–99)
Glucose-Capillary: 110 mg/dL — ABNORMAL HIGH (ref 70–99)
Glucose-Capillary: 81 mg/dL (ref 70–99)
Glucose-Capillary: 120 mg/dL — ABNORMAL HIGH (ref 70–99)
Glucose-Capillary: 86 mg/dL (ref 70–99)
Glucose-Capillary: 113 mg/dL — ABNORMAL HIGH (ref 70–99)

## 2020-09-06 MED ORDER — ENSURE ENLIVE PO LIQD
237.0000 mL | Freq: Two times a day (BID) | ORAL | Status: DC
  Administered 2020-09-06 – 2020-09-10 (×5): 237 mL via ORAL

## 2020-09-06 MED ORDER — VANCOMYCIN HCL 750 MG/150ML IV SOLN
750.0000 mg | Freq: Two times a day (BID) | INTRAVENOUS | Status: DC
  Administered 2020-09-07 (×2): 750 mg via INTRAVENOUS
  Filled 2020-09-06 (×3): qty 150

## 2020-09-06 MED ORDER — PIPERACILLIN-TAZOBACTAM 3.375 G IVPB
3.3750 g | Freq: Three times a day (TID) | INTRAVENOUS | Status: DC
  Administered 2020-09-06 – 2020-09-09 (×8): 3.375 g via INTRAVENOUS
  Filled 2020-09-06 (×8): qty 50

## 2020-09-06 MED ORDER — OXYCODONE HCL 5 MG PO TABS
5.0000 mg | ORAL_TABLET | Freq: Four times a day (QID) | ORAL | Status: DC | PRN
  Administered 2020-09-06: 10 mg via ORAL
  Administered 2020-09-06: 5 mg via ORAL
  Administered 2020-09-07: 10 mg via ORAL
  Administered 2020-09-08 – 2020-09-09 (×4): 5 mg via ORAL
  Administered 2020-09-10: 10 mg via ORAL
  Filled 2020-09-06 (×2): qty 1
  Filled 2020-09-06: qty 2
  Filled 2020-09-06 (×2): qty 1
  Filled 2020-09-06: qty 2
  Filled 2020-09-06 (×3): qty 1
  Filled 2020-09-06: qty 2
  Filled 2020-09-06: qty 1

## 2020-09-06 MED ORDER — BARIUM SULFATE 2.1 % PO SUSP
ORAL | Status: AC
  Administered 2020-09-06: 1 mL via ORAL
  Filled 2020-09-06: qty 2

## 2020-09-06 MED ORDER — GERHARDT'S BUTT CREAM
TOPICAL_CREAM | Freq: Two times a day (BID) | CUTANEOUS | Status: DC
  Filled 2020-09-06: qty 1

## 2020-09-06 MED ORDER — ADULT MULTIVITAMIN W/MINERALS CH
1.0000 | ORAL_TABLET | Freq: Every day | ORAL | Status: DC
  Administered 2020-09-06 – 2020-09-10 (×5): 1 via ORAL
  Filled 2020-09-06 (×5): qty 1

## 2020-09-06 MED ORDER — VANCOMYCIN HCL 1250 MG/250ML IV SOLN
1250.0000 mg | Freq: Once | INTRAVENOUS | Status: AC
  Administered 2020-09-06: 1250 mg via INTRAVENOUS
  Filled 2020-09-06: qty 250

## 2020-09-06 NOTE — Evaluation (Signed)
Physical Therapy Evaluation Patient Details Name: Amber [name] MRN: [id number] DOB: [birth year] Today's Date: 09/06/2020   History of Present Illness  This 64 y.o. female admitted 08/27/2020 with diarrhea and waekness. She was hypotensive on admission.  Dx > sepsis likely due to UTI vs. colitis.  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  She was transferred from [facility] to [facility] 10/10 for continuous EEG monitoring.  She was intubated 10/10-10/16.  MRI of brain on 10/12 showed no acute abnormality, but very advanced chronic ischemic diseases.   PMH includes:  CVA, CAD, DM SPO, HTN, GERD, Sz disorder, asthma  Clinical Impression  Patient presents with generalized weakness, impaired balance, edema in bil hands, impaired cognition and impaired mobility s/p above. Pt lives at home with family and does minimal ambulation with RW when not in pain. Granddaughter and PCA assist with ADLs. Today, pt is total A for bed mobility and total A-Mod A for sitting balance. Perserverating on wanting to go home and get a warm blanket. Pt with cognitive deficits relating to orientation, attention, safety/judement, awareness and problem solving. Would benefit from SNF to maximize independence and mobility prior to return home. Per notes, family can provide 24/7 assist and likely to refuse SNF however unsure family can care for pt at this level as she appears far from functional baseline. Will follow acutely.    Follow Up Recommendations SNF;Supervision for mobility/OOB (family likely to refuse SNF)    Equipment Recommendations  None recommended by PT    Recommendations for Other Services       Precautions / Restrictions Precautions Precautions: Fall Restrictions Weight Bearing Restrictions: No      Mobility  Bed Mobility Overal bed mobility: Needs Assistance Bed Mobility: Supine to Sit;Sit to Supine     Supine to sit: Total assist;+2 for  safety/equipment;+2 for physical assistance Sit to supine: Total assist;+2 for physical assistance;+2 for safety/equipment   General bed mobility comments: Pt with very little to no assist with bed mobility   Transfers                 General transfer comment: unable to attempt this date   Ambulation/Gait             General Gait Details: Deferred.  Stairs            Wheelchair Mobility    Modified Rankin (Stroke Patients Only)       Balance Overall balance assessment: Needs assistance Sitting-balance support: Feet unsupported;No upper extremity supported Sitting balance-Leahy Scale: Zero Sitting balance - Comments: Pt requires total A, overall with occasional periods of mod A                                      Pertinent Vitals/Pain Pain Assessment: Faces Faces Pain Scale: Hurts little more Pain Location: bil. LEs with PROM  Pain Descriptors / Indicators: Grimacing;Headache Pain Intervention(s): Monitored during session;Limited activity within patient's tolerance    Home Living Family/patient expects to be discharged to:: Private residence Living Arrangements: Children Available Help at Discharge: Family;Personal care attendant;Available 24 hours/day Type of Home: Apartment Home Access: Stairs to enter Entrance Stairs-Rails: Right Entrance Stairs-Number of Steps: 5 Home Layout: One level Home Equipment: Emergency planning/management officer - 4 wheels;Wheelchair - manual;Hospital bed;Bedside commode Additional Comments: pt states her daughter, granddaughter, and son can combined assist her 24/7.  Prior Function Level of Independence: Needs assistance   Gait / Transfers Assistance Needed: Per grandaughter10/17, pt walks with RW from one end of house to another on "good days" (less pain); when has to go up/down steps to enter home, grandaughter carries her in her arms (has tried using wheelchair and neither her or pt like it--pt very fearful of  falling)  ADL's / Homemaking Assistance Needed: HHA 2 hours/day 7 days/week (5-7:00 a.m.)--grandaughter assists with ADLs but she is able to groom and feed herself        Hand Dominance   Dominant Hand: Right    Extremity/Trunk Assessment   Upper Extremity Assessment Upper Extremity Assessment: Defer to OT evaluation RUE Deficits / Details: Pt with edema bil. UE/hands.   Strength grossly 1/5 all muscle groups of UEs except hand 2/5  RUE Coordination: decreased gross motor;decreased fine motor LUE Deficits / Details: Pt with edema bil. UE/hands.   Strength grossly 1/5 all muscle groups of UEs except hand 2/5  LUE Coordination: decreased gross motor;decreased fine motor    Lower Extremity Assessment Lower Extremity Assessment: RLE deficits/detail;LLE deficits/detail;Generalized weakness RLE Deficits / Details: Limited hip/knee AROM, pain with AAROM. Grossly ~2-/5 knee extension, 1/5 hip flexion RLE Sensation: WNL LLE Deficits / Details: Limited hip/knee AROM, pain with AAROM. Grossly ~2-/5 knee extension, 1/5 hip flexion LLE Sensation: WNL    Cervical / Trunk Assessment Cervical / Trunk Assessment: Other exceptions Cervical / Trunk Exceptions: impaired trunk control   Communication   Communication: No difficulties  Cognition Arousal/Alertness: Awake/alert Behavior During Therapy: WFL for tasks assessed/performed Overall Cognitive Status: No family/caregiver present to determine baseline cognitive functioning Area of Impairment: Orientation;Attention;Memory;Following commands;Safety/judgement;Awareness;Problem solving                 Orientation Level: Disoriented to;Time;Situation Current Attention Level: Focused;Sustained Memory: Decreased short-term memory Following Commands: Follows one step commands consistently;Follows one step commands with increased time Safety/Judgement: Decreased awareness of safety Awareness: Intellectual Problem Solving: Slow  processing;Decreased initiation;Difficulty sequencing;Requires verbal cues General Comments: Pt perseverates on c/o being cold, wanting a blanket, and wanting to go home.        General Comments General comments (skin integrity, edema, etc.): VSS on RA,    Exercises     Assessment/Plan    PT Assessment Patient needs continued PT services  PT Problem List Decreased strength;Decreased mobility;Decreased safety awareness;Decreased range of motion;Decreased activity tolerance;Decreased cognition;Pain;Decreased balance       PT Treatment Interventions Therapeutic activities;Gait training;Therapeutic exercise;Patient/family education;Balance training;Wheelchair mobility training;Neuromuscular re-education;Functional mobility training;Stair training;Cognitive remediation    PT Goals (Current goals can be found in the Care Plan section)  Acute Rehab PT Goals Patient Stated Goal: "I want to go home" "I want a hot blanket  PT Goal Formulation: With patient Time For Goal Achievement: 09/20/20 Potential to Achieve Goals: Fair    Frequency Min 3X/week   Barriers to discharge Inaccessible home environment stairs to enter home    Co-evaluation PT/OT/SLP Co-Evaluation/Treatment: Yes Reason for Co-Treatment: Complexity of the patient's impairments (multi-system involvement);Necessary to address cognition/behavior during functional activity;To address functional/ADL transfers PT goals addressed during session: Mobility/safety with mobility;Strengthening/ROM;Balance OT goals addressed during session: Strengthening/ROM       AM-PAC PT "6 Clicks" Mobility  Outcome Measure Help needed turning from your back to your side while in a flat bed without using bedrails?: Total Help needed moving from lying on your back to sitting on the side of a flat bed without using bedrails?: Total Help needed moving to  and from a bed to a chair (including a wheelchair)?: Total Help needed standing up from a  chair using your arms (e.g., wheelchair or bedside chair)?: Total Help needed to walk in hospital room?: Total Help needed climbing 3-5 steps with a railing? : Total 6 Click Score: 6    End of Session   Activity Tolerance: Patient tolerated treatment well Patient left: in bed;with call bell/phone within reach;with bed alarm set Nurse Communication: Mobility status;Need for lift equipment PT Visit Diagnosis: Pain;Muscle weakness (generalized) (M62.81) Pain - part of body:  (LEs with movement)    Time: 6384-6659 PT Time Calculation (min) (ACUTE ONLY): 22 min   Charges:   PT Evaluation $PT Eval Moderate Complexity: 1 Mod          Vale Haven, PT, DPT Acute Rehabilitation Services Pager [phone] Office [phone]      Blake Divine A Rice Walsh 09/06/2020, 11:51 AM

## 2020-09-06 NOTE — Progress Notes (Addendum)
Subjective: No clinical seizures.  Patient reports being in pain.  Denies any other concerns.  ROS: negative except above  Examination  Vital signs in last 24 hours: Temp:  [98.4 F (36.9 C)-99.4 F (37.4 C)] 99.4 F (37.4 C) (10/18 1200) Pulse Rate:  [81-111] 106 (10/18 1200) Resp:  [17-26] 19 (10/18 1200) BP: (90-125)/(65-94) 118/78 (10/18 1200) SpO2:  [98 %-100 %] 100 % (10/18 1200) Weight:  [61.9 kg] 61.9 kg (10/18 0446)  General: lying in bed, not in apparent distress CVS: pulse-normal rate and rhythm RS: breathing comfortably, CTA B Extremities: normal, warm Neuro: Awake, alert, oriented to place and person but not to time, follows commands, cranial nerves 2-12 grossly intact, antigravity strength in all 4 extremities but unable to participate in further strength examination due to pain.   Basic Metabolic Panel: Recent Labs  Lab 08/31/20 0558 08/31/20 0558 09/01/20 0549 09/01/20 0549 09/01/20 0742 09/01/20 1618 09/02/20 0515 09/02/20 0515 09/02/20 1527 09/02/20 1527 09/03/20 0411 09/03/20 0411 09/03/20 1300 09/03/20 2211 09/04/20 0507 09/05/20 0530 09/06/20 0500  NA 138   < > 143   < > 140   < > 146*   < > 147*  --  146*  --   --   --  145 144 142  K 2.8*   < > QUESTIONABLE RESULTS, RECOMMEND RECOLLECT TO VERIFY   < > <2.0*   < > 2.9*   < > 3.6   < > 3.3*   < > 4.5 3.8 3.9 3.3* 3.7  CL 101   < > 106   < > 104   < > 111   < > 114*  --  114*  --   --   --  112* 108 109  CO2 21*   < > 22   < > 21*   < > 22   < > 22  --  22  --   --   --  24 27 25   GLUCOSE 154*   < > 181*   < > 194*   < > 213*   < > 133*  --  172*  --   --   --  145* 87 129*  BUN 18   < > 21   < > 21   < > 26*   < > 26*  --  27*  --   --   --  25* 20 16  CREATININE 2.39*   < > 1.90*   < > 1.93*   < > 1.66*   < > 1.39*  --  1.19*  --   --   --  0.87 0.73 0.69  CALCIUM 8.1*   < > 8.5*   < > 8.4*   < > 8.8*   < > 8.6*   < > 8.6*   < >  --   --  8.4* 8.1* 7.8*  MG 1.9   < > 1.9   < > 1.8  --  2.5*   --   --   --   --   --   --   --  1.8 1.5* 2.2  PHOS 5.1*  --  4.3  --  4.4  --   --   --   --   --   --   --   --   --   --   --   --    < > = values in this interval not displayed.  CBC: Recent Labs  Lab 08/31/20 0558 09/01/20 0549 09/03/20 0411 09/04/20 0507 09/05/20 0530  WBC 6.9 8.1 10.5 10.2 9.7  HGB 10.5* 10.3* 9.9* 9.0* 9.8*  HCT 30.3* 29.2* 28.6* 26.4* 28.0*  MCV 77.7* 77.0* 76.9* 77.6* 76.9*  PLT 54* 57* 51* 56* 75*     Coagulation Studies: Recent Labs    09/04/20 0800  LABPROT 15.2  INR 1.3*    Imaging No new brain imaging overnight.  ASSESSMENT AND PLAN: 64 year old female with chronic infarcts and epilepsy who was admitted for sepsis and was noted to have focal seizures.  Chronic infarcts Epilepsy with breakthrough seizures Acute encephalopathy, likely toxic-metabolic, infectious Hyperammonemia Hypertension -Acute encephalopathy likely secondary to hyperammonemia, postictal state, cirrhosis, worsening renal function and possible IBD: Improving -EEG finding most likely related to aforementioned systemic comorbidities. -Even though it appears that patient is not having seizures at this point, hyperammonemia is neurotoxic and can definitely precipitate seizures in patient who is already epileptic.  Recommendations -Continuereduce Keppra to 500mg  twice daily due to poor renal functionand oxcarbazepine 150 mg twice daily - Any worsening of mentation or clinical seizure-like activity should be notified to neurology. -Can increase oxcarbazepine if patient has seizure recurrence -Blood pressure management with goal normotension -Seizure precautions -As needed IV Ativan 2 mg for clinical seizure-like activity -Management of rest of comorbidities per primary team -Follow-up with neurology in 8 to 12 weeks.  Thank you for allowing to participate in the care of this patient.  Neurology will sign off.  Please call if you have any further  questions.  I have spent a total of68minuteswith the patient reviewing hospitalnotes, test results, labs and examining the patient as well as establishing an assessment and plan. >50% of time was spent in direct patient care.   32m Epilepsy [facility] For questions after 5pm please refer to AMION to reach the Neurologist on call

## 2020-09-06 NOTE — Progress Notes (Signed)
Nutrition Follow-up  DOCUMENTATION CODES:   Non-severe (moderate) malnutrition in context of chronic illness  INTERVENTION:   -Magic cup TID with meals, each supplement provides 290 kcal and 9 grams of protein -Ensure Enlive po BID, each supplement provides 350 kcal and 20 grams of protein -MVI with minerals daily  NUTRITION DIAGNOSIS:   Moderate Malnutrition related to chronic illness (CHF) as evidenced by mild fat depletion, moderate muscle depletion, mild muscle depletion.  Ongoing  GOAL:   Patient will meet greater than or equal to 90% of their needs  Progressing   MONITOR:   PO intake, Supplement acceptance, Diet advancement, Labs, Weight trends, Skin, I & O's  REASON FOR ASSESSMENT:   Ventilator, Consult Enteral/tube feeding initiation and management  ASSESSMENT:   64 year old female who presented to the ED on 10/07 with AMS. PMH of CAD, CVA, T2DM, SBO, HTN, GERD, seizures, CHF. Pt admitted with sepsis. CT abdomen/pelvis showing enteritis.  10/10 - transferred to [facility] for EEG, intubated 10/12- rectal tube placed 10/16- extubated 10/17- s/p BSE- advanced to dysphagia 1 diet with thin liquids  Reviewed I/O's: -2.6 L x 24 hours and +1.4 L x 24 hours  UOP: 2.1 L x 24 hours  Rectal tube: 1 L x 24 hours  Pt sleeping soundly at time of visit; about to transfer to floor per RN.   Pt just advanced to dysphagia 1 diet with thin liquids. No meal completion data currently available at this time. Pt would greatly benefit from addition of oral nutrition supplements secondary to malnutrition.   Medications reviewed and include lactulose and creon.   Labs reviewed: CBGS: 81-120 (inpatient orders for glycemic control are 0-9 units insulin aspart every 4 hours).   Diet Order:   Diet Order            DIET - DYS 1 Room service appropriate? Yes; Fluid consistency: Thin  Diet effective now                 EDUCATION NEEDS:   No education needs have been identified at  this time  Skin:  Skin Assessment: Skin Integrity Issues: Skin Integrity Issues:: Stage II Stage I: - Stage II: lt shoulder Other: MASD to periuneum and groin  Last BM:  09/06/20 (via rectal tube)  Height:   Ht Readings from Last 1 Encounters:  08/29/20 5' (1.524 m)    Weight:   Wt Readings from Last 1 Encounters:  09/06/20 61.9 kg   BMI:  Body mass index is 26.65 kg/m.  Estimated Nutritional Needs:   Kcal:  1500-1700  Protein:  75-90 grams  Fluid:  > 1.5 L    Levada Schilling, RD, LDN, CDCES Registered Dietitian II Certified Diabetes Care and Education Specialist Please refer to [facility] for RD and/or RD on-call/weekend/after hours pager

## 2020-09-06 NOTE — Evaluation (Signed)
Occupational Therapy Evaluation Patient Details Name: Amber [name] MRN: [id number] DOB: 1956-01-28 Today's Date: 09/06/2020    History of Present Illness This 64 y.o. female admitted 08/27/2020 with diarrhea and waekness. She was hypotensive on admission.  Dx > sepsis likely due to UTI vs. colitis.  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  She was transferred from [facility] to [facility] 10/10 for continuous EEG monitoring.  She was intubated 10/10-10/16.  MRI of brain on 10/12 showed no acute abnormality, but very advanced chronic ischemic diseases.   PMH includes:  CVA, CAD, DM SPO, HTN, GERD, Sz disorder, asthma   Clinical Impression   Pt admitted with above. She demonstrates the below listed deficits and will benefit from continued OT to maximize safety and independence with BADLs.  Pt presents to OT with profound generalized weakness (appears worse than last admission based on chart review), increased edema bil. UEs, decreased trunk control, impaired balance, decreased activity tolerance, impaired cognition.  She currently requires total A for all aspects of ADLs and functional mobliity.  She reports that prior to previous admission, she was able to walk around her house with RW, and was able to perform ADLs without assist - granddaughter did confirm she was ambulatory with RW with PT yesterday.  She will require 24 hour assistance at discharge.  Anticipate she will require SNF level rehab.        Follow Up Recommendations  SNF;Supervision/Assistance - 24 hour    Equipment Recommendations  Hospital bed;3 in 1 bedside commode    Recommendations for Other Services       Precautions / Restrictions Precautions Precautions: Fall Restrictions Weight Bearing Restrictions: No      Mobility Bed Mobility Overal bed mobility: Needs Assistance Bed Mobility: Supine to Sit;Sit to Supine     Supine to sit: Total assist;+2 for  safety/equipment;+2 for physical assistance Sit to supine: Total assist;+2 for physical assistance;+2 for safety/equipment   General bed mobility comments: Pt with very little to no assist with bed mobility   Transfers                 General transfer comment: unable to attempt this date     Balance Overall balance assessment: Needs assistance Sitting-balance support: Feet supported Sitting balance-Leahy Scale: Zero Sitting balance - Comments: Pt requires total A, overall with occasional periods of mod A                                    ADL either performed or assessed with clinical judgement   ADL Overall ADL's : Needs assistance/impaired Eating/Feeding: Total assistance;Bed level   Grooming: Wash/dry hands;Wash/dry face;Oral care;Sitting;Bed level;Total assistance   Upper Body Bathing: Total assistance;Bed level   Lower Body Bathing: Total assistance;Bed level   Upper Body Dressing : Total assistance;Bed level   Lower Body Dressing: Total assistance;Bed level   Toilet Transfer: Total assistance Toilet Transfer Details (indicate cue type and reason): unable  Toileting- Clothing Manipulation and Hygiene: Total assistance       Functional mobility during ADLs: Total assistance General ADL Comments: Pt unable to assist due to profound weakness      Vision Patient Visual Report: No change from baseline       Perception     Praxis      Pertinent Vitals/Pain Pain Assessment: Faces Faces Pain Scale: Hurts little more  Pain Location: bil. LEs with PROM  Pain Descriptors / Indicators: Grimacing;Headache Pain Intervention(s): Monitored during session;Limited activity within patient's tolerance     Hand Dominance Right   Extremity/Trunk Assessment Upper Extremity Assessment Upper Extremity Assessment: RUE deficits/detail;LUE deficits/detail RUE Deficits / Details: Pt with edema bil. UE/hands.   Strength grossly 1/5 all muscle groups of UEs  except hand 2/5  RUE Coordination: decreased gross motor;decreased fine motor LUE Deficits / Details: Pt with edema bil. UE/hands.   Strength grossly 1/5 all muscle groups of UEs except hand 2/5  LUE Coordination: decreased gross motor;decreased fine motor   Lower Extremity Assessment Lower Extremity Assessment: Defer to PT evaluation   Cervical / Trunk Assessment Cervical / Trunk Assessment: Other exceptions Cervical / Trunk Exceptions: impaired trunk control    Communication Communication Communication: No difficulties   Cognition Arousal/Alertness: Awake/alert Behavior During Therapy: WFL for tasks assessed/performed Overall Cognitive Status: No family/caregiver present to determine baseline cognitive functioning Area of Impairment: Orientation;Attention;Memory;Following commands;Safety/judgement;Awareness;Problem solving                 Orientation Level: Disoriented to;Time;Situation Current Attention Level: Focused;Sustained Memory: Decreased short-term memory Following Commands: Follows one step commands consistently;Follows one step commands with increased time Safety/Judgement: Decreased awareness of safety   Problem Solving: Slow processing;Decreased initiation;Difficulty sequencing;Requires verbal cues General Comments: Pt perseverates on c/o being cold, wanting a blanket, and wanting to go home.     General Comments  VSS     Exercises     Shoulder Instructions      Home Living Family/patient expects to be discharged to:: Private residence Living Arrangements: Children Available Help at Discharge: Family;Personal care attendant;Available 24 hours/day Type of Home: Apartment Home Access: Stairs to enter Entrance Stairs-Number of Steps: 5 Entrance Stairs-Rails: Right Home Layout: One level     Bathroom Shower/Tub: Chief Strategy Officer: Standard Bathroom Accessibility: Yes   Home Equipment: Emergency planning/management officer - 4 wheels;Wheelchair -  manual;Hospital bed;Bedside commode   Additional Comments: pt states her daughter, granddaughter, and son can combined assist her 24/7.      Prior Functioning/Environment Level of Independence: Needs assistance  Gait / Transfers Assistance Needed: Per grandaughter10/17, pt walks with RW from one end of house to another on "good days" (less pain); when has to go up/down steps to enter home, grandaughter carries her in her arms (has tried using wheelchair and neither her or pt like it--pt very fearful of falling) ADL's / Homemaking Assistance Needed: HHA 2 hours/day 7 days/week (5-7:00 a.m.)--grandaughter assists with ADLs but she is able to groom and feed herself            OT Problem List: Decreased strength;Decreased activity tolerance;Impaired balance (sitting and/or standing);Decreased range of motion;Decreased coordination;Decreased cognition;Decreased safety awareness;Decreased knowledge of use of DME or AE;Impaired UE functional use;Increased edema;Pain      OT Treatment/Interventions: Self-care/ADL training;Therapeutic exercise;Neuromuscular education;DME and/or AE instruction;Manual therapy;Splinting;Therapeutic activities;Cognitive remediation/compensation;Patient/family education;Balance training    OT Goals(Current goals can be found in the care plan section) Acute Rehab OT Goals Patient Stated Goal: "I want to go home" "I want a hot blanket  OT Goal Formulation: With patient Time For Goal Achievement: 09/20/20 Potential to Achieve Goals: Fair ADL Goals Pt Will Perform Grooming: with max assist;sitting Pt Will Perform Upper Body Bathing: with max assist;sitting Pt Will Transfer to Toilet: with max assist;squat pivot transfer;bedside commode Additional ADL Goal #1: Family will be independent in PROM/AAROM HEP for bil. UEs  OT Frequency: Min 2X/week  Barriers to D/C:    uncertain if family able to provide necessary level of assist        Co-evaluation PT/OT/SLP  Co-Evaluation/Treatment: Yes Reason for Co-Treatment: Complexity of the patient's impairments (multi-system involvement);Necessary to address cognition/behavior during functional activity;To address functional/ADL transfers   OT goals addressed during session: Strengthening/ROM      AM-PAC OT "6 Clicks" Daily Activity     Outcome Measure Help from another person eating meals?: Total Help from another person taking care of personal grooming?: Total Help from another person toileting, which includes using toliet, bedpan, or urinal?: Total Help from another person bathing (including washing, rinsing, drying)?: Total Help from another person to put on and taking off regular upper body clothing?: Total Help from another person to put on and taking off regular lower body clothing?: Total 6 Click Score: 6   End of Session Nurse Communication: Mobility status  Activity Tolerance: Patient limited by fatigue;Other (comment) (being cold ) Patient left: in bed;with call bell/phone within reach;with bed alarm set  OT Visit Diagnosis: Unsteadiness on feet (R26.81);Cognitive communication deficit (R41.841);Muscle weakness (generalized) (M62.81) Pain - part of body: Leg                Time: 1010-1031 OT Time Calculation (min): 21 min Charges:  OT General Charges $OT Visit: 1 Visit OT Evaluation $OT Eval Moderate Complexity: 1 Mod  [name]., OTR/L Acute Rehabilitation Services Pager [phone] Office [phone]   Jeani Hawking M 09/06/2020, 11:35 AM

## 2020-09-06 NOTE — Progress Notes (Signed)
PROGRESS NOTE    Amber [name]  GLO:[id number] DOB: Oct 04, 1956 DOA: 08/26/2020 PCP: [name], MD   Brief Narrative: Amber [name] is a 64 y.o. female with a history of CAD, CVA, diabetes, SBO, hypertension, GERD, seizure disorder. She was admitted to [facility] on 10/7 complaining of diarrhea and weakness.  She was found to have an elevated lactate and elevated white count.  Stool studies were remarkably unremarkable including a stool guaiac which was negative.  CT scan of the abdomen was remarkable for pancreatic calcifications and thickening of the bowel wall most conspicuously in the ileum.  Blood and urine cultures obtained on admission were negative.  She was transiently supported with alpha agents and placed on stress dose steroids as she is chronically on no home steroids for asthma.  Her lactic acidosis has cleared and she is now off of pressors however on 10/9 she developed drooling and a right eye gaze and was felt to be having partial complex seizures.  Mental  status has not recovered.  CT scan of the head showed an old infarct with no acute changes. There was concern that she was suffering from some clinical status and she was transferred to this hospital for continuous EEG monitoring.  On arrival here she was not protecting her airway and was drooling and she was semiemergently intubated for airway protection.     Assessment & Plan:   Active Problems:   Hypokalemia   AKI (acute kidney injury) (HCC)   Shock (HCC)   Severe sepsis (HCC)   Renal failure   Encephalopathy   Chronic respiratory failure (HCC)   Acute respiratory failure with hypoxia Secondary to poor mental status in setting of multiple rounds of Ativan during episode of diagnosed subclinical status epilepticus. Patient intubated and managed on a ventilator. She has now been extubated and is on room air.  Shock Unknown etiology. Does not appear that patient meets sepsis criteria. Patient required admission to the ICU on  10/7. She was intubated on 10/10 and managed on Levophed/Neosynephrine for vasopressor support. She was eventual extubated on 10/16 and transferred out of the ICU on 10/17. Hypotension additionally managed with midodrine and short course of Florinef for possible adrenal insufficiency.  Seizures Neurology consulted. Multiple EEGs with findings related to encephalopathy. No acute stroke found on CT head/MRI brain imaging. -Neurology recommendations: Keppra 500 mg BID, oxcarbazepine 150 mg BID with plans to increase if seizure recurrence -Ativan IV 2 mg prn seizure-like activity  Acute metabolic encephalopathy Multifactorial with evidence of hyperammonemia in addition to patient being post-ictal. Patient's valproic acid was discontinued in setting of elevated ammonia.   Acute on Chronic anemia Baseline hemoglobin of 8. Patient transfused 2 units of PRBC on 10/8 for hemoglobin of 5.9 with significant improvement. Currently stable at 10. GI consulted and plan for colonoscopy  Diarrhea CT abdomen/pelvis significant for evidence of diarrheal state, diverticulosis without diverticulitis. GI previously consulted and recommended colonoscopy -Re-consult GI in AM  Dysphagia -Dysphagia 1 diet  AKI Secondary to shock. Resolved with pressor support,   Thrombocytopenia Acute in setting of acute illness. Stable.  Hyperlipidemia Patient is on Lipitor as an outpatient. Held on admission.  Primary hypertension Patient is on amlodipine and metoprolol as an outpatient which are held on setting of shock. Patient is off vasopressors and blood pressure is currently mostly controlled. -Continue amlodipine, metoprolol  Chronic pancreatitis Patient is on Creon as an outpatient.  Gout Patient is on allopurinol as an outpatient.  Chronic pain Patient is on  oxycodone as an outpatient -Oxycodone prn  Pressure injury Left, posterior shoulder, POA   DVT prophylaxis: SCDs Code Status:   Code Status:  Full Code Family Communication: Called daughter but no response Disposition Plan: Discharge possibly to SNF pending PT evaluation in addition to GI recommendations   Consultants:   PCCM  Neurology  Gastroenterology  Procedures:   ETT (10/10>>10/16  EEG  Antimicrobials:  Vancomycin (10/7)  Zosyn (10/7>>10/13)  Rifaximin (10/13>>   Subjective: Pain in her abdomen  Objective: Vitals:   09/06/20 0400 09/06/20 0446 09/06/20 0500 09/06/20 0600  BP: 123/85  125/81 123/85  Pulse: (!) 105  (!) 104 (!) 108  Resp: 18  (!) 23 19  Temp: 98.4 F (36.9 C)     TempSrc: Oral     SpO2: 100%  100% 100%  Weight:  61.9 kg    Height:        Intake/Output Summary (Last 24 hours) at 09/06/2020 0748 Last data filed at 09/06/2020 0400 Gross per 24 hour  Intake 500.04 ml  Output 3125 ml  Net -2624.96 ml   Filed Weights   09/04/20 0500 09/05/20 0500 09/06/20 0446  Weight: 64 kg 63.8 kg 61.9 kg    Examination:  General exam: Appears calm and comfortable Respiratory system: Clear to auscultation. Respiratory effort normal. Cardiovascular system: S1 & S2 heard, RRR. No murmurs, rubs, gallops or clicks.  Gastrointestinal system: Abdomen is distended, soft and generally tender. No organomegaly or masses felt. Normal bowel sounds heard. Central nervous system: Alert and oriented to person only. Musculoskeletal: Diffuse 2+ pitting edema. Skin: No cyanosis. No rashes Psychiatry: Judgement and insight appear impaired. Flat affect    Data Reviewed: I have personally reviewed following labs and imaging studies  CBC Lab Results  Component Value Date   WBC 9.7 09/05/2020   RBC 3.64 (L) 09/05/2020   HGB 9.8 (L) 09/05/2020   HCT 28.0 (L) 09/05/2020   MCV 76.9 (L) 09/05/2020   MCH 26.9 09/05/2020   PLT 75 (L) 09/05/2020   MCHC 35.0 09/05/2020   RDW 21.3 (H) 09/05/2020   LYMPHSABS 0.6 (L) 08/30/2020   MONOABS 0.2 08/30/2020   EOSABS 0.0 08/30/2020   BASOSABS 0.0 08/30/2020      Last metabolic panel Lab Results  Component Value Date   NA 142 09/06/2020   K 3.7 09/06/2020   CL 109 09/06/2020   CO2 25 09/06/2020   BUN 16 09/06/2020   CREATININE 0.69 09/06/2020   GLUCOSE 129 (H) 09/06/2020   GFRNONAA >60 09/06/2020   GFRAA 57 (L) 08/19/2020   CALCIUM 7.8 (L) 09/06/2020   PHOS 4.4 09/01/2020   PROT 5.3 (L) 09/05/2020   ALBUMIN 2.0 (L) 09/05/2020   BILITOT 8.1 (H) 09/05/2020   ALKPHOS 600 (H) 09/05/2020   AST 162 (H) 09/05/2020   ALT 64 (H) 09/05/2020   ANIONGAP 8 09/06/2020    CBG (last 3)  Recent Labs    09/06/20 0308 09/06/20 0429 09/06/20 0737  GLUCAP 66* 110* 81     GFR: Estimated Creatinine Clearance: 60 mL/min (by C-G formula based on SCr of 0.69 mg/dL).  Coagulation Profile: Recent Labs  Lab 08/31/20 0558 09/01/20 0549 09/04/20 0800  INR 1.6* 1.4* 1.3*    No results found for this or any previous visit (from the past 240 hour(s)).      Radiology Studies: No results found.      Scheduled Meds: . amLODipine  10 mg Oral Daily  . chlorhexidine  15 mL Mouth Rinse  BID  . Chlorhexidine Gluconate Cloth  6 each Topical Daily  . famotidine  20 mg Oral BID  . Gerhardt's butt cream   Topical BID  . insulin aspart  0-9 Units Subcutaneous Q4H  . lactulose  20 g Oral BID  . levETIRAcetam  500 mg Oral BID  . lipase/protease/amylase  24,000 Units Oral Q8H  . mouth rinse  15 mL Mouth Rinse q12n4p  . metoprolol tartrate  12.5 mg Oral BID  . nystatin  5 mL Oral QID  . OXcarbazepine  150 mg Oral BID  . rifaximin  550 mg Oral BID  . sodium chloride flush  10-40 mL Intracatheter Q12H   Continuous Infusions: . sodium chloride       LOS: 11 days     Jacquelin Hawking, MD [facility] 09/06/2020, 7:48 AM  If 7PM-7AM, please contact night-coverage [email or web address]

## 2020-09-06 NOTE — Consult Note (Addendum)
WOC Nurse Consult Note: Reason for Consult: Consult requested for buttocks.  Pt has a flexiseal inserted to attempt to contain liquid stool but it leaks frequently around the insertion site and pt has red moist partial thickness skin loss to bilat buttocks/sacrum, and perineum.  Appearance is consistent with moisture associated skin damage; area is beginning to try and peel at the edges, which indicates improvement.   Dressing procedure/placement/frequency: Pt is on a low airloss mattress to reduce pressure.  Topical treatment orders provided for bedside nurses to perform to promote drying and healing as follows: Apply barrier cream to buttocks BID and PRN when turning and cleaning, then apply antifungal powder over the cream.  Leave foam dressings off the area.  Please re-consult if further assistance is needed.  Thank-you,  Cammie Mcgee MSN, RN, CWOCN, [place], CNS [phone]

## 2020-09-06 NOTE — Progress Notes (Signed)
Floor coverage progress note  Patient was recently admitted to the ICU for shock with lactic acidosis; unclear source of infection.  She initially presented with diarrhea and concern for enteritis versus Crohn's on CT.  Negative C. difficile and GI panel.  She completed a 7-day course of Zosyn and now off stress dose steroids.  Patient febrile with T-max 103 F. Tachycardic with heart rate 120-130.  Most recent blood pressure 130/86.  Not tachypneic or hypoxic.  -Tylenol as needed for fevers -Start broad-spectrum antibiotics -Repeat CT chest/abdomen/pelvis, UA, urine culture, blood culture x2, CBC, lactate, procalcitonin -EKG

## 2020-09-06 NOTE — Progress Notes (Signed)
Patient upset wants to go home with son this time is crying so heart usually elevated is more than normal and also temp slightly elevated as well. Will continue observe for further changes to alert MD of.   09/06/20 1646  Vitals  Temp (!) 100.8 F (38.2 C)  Temp Source Oral  BP (!) 126/101  MAP (mmHg) 109  BP Location Left Arm  BP Method Automatic  Patient Position (if appropriate) Lying  Pulse Rate (!) 125  Pulse Rate Source Monitor  Resp 18  Level of Consciousness  Level of Consciousness Alert  Oxygen Therapy  SpO2 99 %  O2 Device Room Air  Pain Assessment  Pain Score 0  ECG Monitoring  New onset of dysrhythmia? No  Glasgow Coma Scale  Eye Opening 4  Best Verbal Response (NON-intubated) 4  Best Motor Response 6  Glasgow Coma Scale Score 14  Provider Notification  Provider Name/Title Dr.Netty aware of tachycardia  Date Provider Notified 09/06/20  Time Provider Notified 1550  Notification Type Page  Notification Reason Other (Comment)  Response No new orders  Date of Provider Response 09/06/20  Time of Provider Response 1550  Rapid Response Notification  Name of Rapid Response RN Notified not this time  Note  Observations  (patient crying wants go home with her son is upset.)

## 2020-09-06 NOTE — Progress Notes (Signed)
Inpatient Diabetes Program Recommendations  AACE/ADA: New Consensus Statement on Inpatient Glycemic Control (2015)  Target Ranges:  Prepandial:   less than 140 mg/dL      Peak postprandial:   less than 180 mg/dL (1-2 hours)      Critically ill patients:  140 - 180 mg/dL   Lab Results  Component Value Date   GLUCAP 120 (H) 09/06/2020   HGBA1C 4.5 (L) 08/18/2020    Review of Glycemic Control Results for BAYLE, CALVO (MRN [id number]) as of 09/06/2020 12:49  Ref. Range 09/05/2020 23:05 09/06/2020 03:08 09/06/2020 04:29 09/06/2020 07:37 09/06/2020 11:12  Glucose-Capillary Latest Ref Range: 70 - 99 mg/dL 88 66 (L) 706 (H) 81 237 (H)    Inpatient Diabetes Program Recommendations:    Novolog 0-6 units q4h.  Once eating please consider tid with meals.  Will continue to follow while inpatient.  Thank you, Dulce Sellar, RN, BSN Diabetes Coordinator Inpatient Diabetes Program [phone] (team pager from 8a-5p)

## 2020-09-06 NOTE — Progress Notes (Signed)
Patient transferred to [address] with previous yellow MEWS VS but on transfer patient is stable no distress noted.

## 2020-09-06 NOTE — Progress Notes (Signed)
Son stayed tried to feed his mom she ate less than 10% spoon fed via son he stated she was a very picky eater at home. She refused her lunch earlier has accepted some health shakes refused her magic cup on the tray today.

## 2020-09-06 NOTE — Progress Notes (Signed)
Pharmacy Antibiotic Note  Amber [name] is a 64 y.o. female admitted on 08/26/2020 having been discharged a week ago due to having UTI.  Pt brought in with AMS.  Patient well known to pharmacy service for previous antibiotic dosing.    Patient febrile to 103  Today, wbc normal at 9.7, AKI on original admit is resolved and renal function is now normal. New orders received to start broad antibiotics for rule out sepsis.   Plan: Zosyn 3.375g IV q8 hours Vancomycin 1250mg  IV once then 750mg  IV q12 hours   Height: 5' (152.4 cm) Weight: 61.9 kg (136 lb 7.4 oz) IBW/kg (Calculated) : 45.5  Temp (24hrs), Avg:100.2 F (37.9 C), Min:98.4 F (36.9 C), Max:103 F (39.4 C)  Recent Labs  Lab 08/31/20 0558 08/31/20 0558 09/01/20 0549 09/01/20 0742 09/02/20 1527 09/03/20 0411 09/04/20 0507 09/05/20 0530 09/06/20 0500  WBC 6.9  --  8.1  --   --  10.5 10.2 9.7  --   CREATININE 2.39*   < > 1.90*   < > 1.39* 1.19* 0.87 0.73 0.69   < > = values in this interval not displayed.    Estimated Creatinine Clearance: 60 mL/min (by C-G formula based on SCr of 0.69 mg/dL).    Allergies  Allergen Reactions  . Ivp Dye [Iodinated Diagnostic Agents] Itching  . Metrizamide Itching   Antimicrobials this admission:  Vanc 10/8 x 1, 10/18>> Zosyn 10/7 >> 10/13, 10/18>>     Microbiology results:  10/7 Flu/RSV/Covid >> neg 10/7 CDiff >> neg 10/7 MRSA PCR >> neg 10/7 UCx >> NG 10/7 BCx >> neg  Thank you for allowing pharmacy to be a part of this patient's care.  12/7 PharmD., BCPS Clinical Pharmacist 09/06/2020 9:08 PM

## 2020-09-06 NOTE — Progress Notes (Signed)
[facility] paged that EKG had critical alert for long QRS will continue to monitor Ilean Skill LPN

## 2020-09-06 NOTE — Progress Notes (Signed)
   09/06/20 1200  Assess: MEWS Score  Temp 99.4 F (37.4 C)  BP 118/78  Pulse Rate (!) 106  ECG Heart Rate (!) 108  Resp 19  Level of Consciousness Alert  SpO2 100 %  O2 Device Room Air  Assess: MEWS Score  MEWS Temp 0  MEWS Systolic 0  MEWS Pulse 1  MEWS RR 0  MEWS LOC 0  MEWS Score 1  MEWS Score Color Green  Assess: if the MEWS score is Yellow or Red  Were vital signs taken at a resting state? Yes  Focused Assessment No change from prior assessment  Early Detection of Sepsis Score *See Row Information* Low  MEWS guidelines implemented *See Row Information* No, vital signs rechecked  Treat  MEWS Interventions Administered scheduled meds/treatments  Pain Scale 0-10  Pain Score 0  Take Vital Signs  Increase Vital Sign Frequency  Yellow: Q 2hr X 2 then Q 4hr X 2, if remains yellow, continue Q 4hrs  Escalate  MEWS: Escalate Yellow: discuss with charge nurse/RN and consider discussing with provider and RRT  Notify: Charge Nurse/RN  Name of Charge Nurse/RN Notified Jasmina,Rn  Date Charge Nurse/RN Notified 09/06/20  Time Charge Nurse/RN Notified 1442  Notify: Provider  Provider Name/Title Netty,Ralph (aware patient chronic yellow MEWS)  Date Provider Notified 09/06/20  Time Provider Notified 1442  Notification Type Page  Notification Reason Other (Comment)  Response  (yellow MEWS)  Date of Provider Response 09/06/20  Time of Provider Response 1443  Notify: Rapid Response  Name of Rapid Response RN Notified  (not this time.)  Document  Patient Outcome Other (Comment) (No change in status noted.)

## 2020-09-07 ENCOUNTER — Inpatient Hospital Stay (HOSPITAL_COMMUNITY): Payer: Medicaid Other

## 2020-09-07 DIAGNOSIS — R579 Shock, unspecified: Secondary | ICD-10-CM | POA: Diagnosis not present

## 2020-09-07 DIAGNOSIS — G9340 Encephalopathy, unspecified: Secondary | ICD-10-CM | POA: Diagnosis not present

## 2020-09-07 DIAGNOSIS — E876 Hypokalemia: Secondary | ICD-10-CM | POA: Diagnosis not present

## 2020-09-07 DIAGNOSIS — N179 Acute kidney failure, unspecified: Secondary | ICD-10-CM | POA: Diagnosis not present

## 2020-09-07 LAB — URINALYSIS, ROUTINE W REFLEX MICROSCOPIC
Bacteria, UA: NONE SEEN
Bilirubin Urine: NEGATIVE
Glucose, UA: NEGATIVE mg/dL
Ketones, ur: NEGATIVE mg/dL
Leukocytes,Ua: NEGATIVE
Nitrite: NEGATIVE
Protein, ur: 30 mg/dL — AB
Specific Gravity, Urine: 1.014 (ref 1.005–1.030)
pH: 7 (ref 5.0–8.0)

## 2020-09-07 LAB — PROCALCITONIN: Procalcitonin: 1.23 ng/mL

## 2020-09-07 LAB — GLUCOSE, CAPILLARY
Glucose-Capillary: 111 mg/dL — ABNORMAL HIGH (ref 70–99)
Glucose-Capillary: 96 mg/dL (ref 70–99)
Glucose-Capillary: 82 mg/dL (ref 70–99)
Glucose-Capillary: 100 mg/dL — ABNORMAL HIGH (ref 70–99)
Glucose-Capillary: 107 mg/dL — ABNORMAL HIGH (ref 70–99)
Glucose-Capillary: 100 mg/dL — ABNORMAL HIGH (ref 70–99)

## 2020-09-07 LAB — CBC
HCT: 22.1 % — ABNORMAL LOW (ref 36.0–46.0)
Hemoglobin: 7.4 g/dL — ABNORMAL LOW (ref 12.0–15.0)
MCH: 26.6 pg (ref 26.0–34.0)
MCHC: 33.5 g/dL (ref 30.0–36.0)
MCV: 79.5 fL — ABNORMAL LOW (ref 80.0–100.0)
Platelets: 139 10*3/uL — ABNORMAL LOW (ref 150–400)
RBC: 2.78 MIL/uL — ABNORMAL LOW (ref 3.87–5.11)
RDW: 21.2 % — ABNORMAL HIGH (ref 11.5–15.5)
WBC: 7 10*3/uL (ref 4.0–10.5)
nRBC: 0 % (ref 0.0–0.2)

## 2020-09-07 LAB — BASIC METABOLIC PANEL
Anion gap: 6 (ref 5–15)
BUN: 10 mg/dL (ref 8–23)
CO2: 22 mmol/L (ref 22–32)
Calcium: 6.5 mg/dL — ABNORMAL LOW (ref 8.9–10.3)
Chloride: 110 mmol/L (ref 98–111)
Creatinine, Ser: 0.54 mg/dL (ref 0.44–1.00)
GFR, Estimated: >60 mL/min (ref >60)
Glucose, Bld: 109 mg/dL — ABNORMAL HIGH (ref 70–99)
Potassium: 3.2 mmol/L — ABNORMAL LOW (ref 3.5–5.1)
Sodium: 138 mmol/L (ref 135–145)

## 2020-09-07 LAB — MAGNESIUM: Magnesium: 1.5 mg/dL — ABNORMAL LOW (ref 1.7–2.4)

## 2020-09-07 LAB — LACTIC ACID, PLASMA: Lactic Acid, Venous: 1 mmol/L (ref 0.5–1.9)

## 2020-09-07 MED ORDER — PREDNISONE 5 MG PO TABS
5.0000 mg | ORAL_TABLET | Freq: Every day | ORAL | Status: DC
  Administered 2020-09-07 – 2020-09-09 (×3): 5 mg via ORAL
  Filled 2020-09-07 (×4): qty 1

## 2020-09-07 MED ORDER — MAGNESIUM SULFATE 2 GM/50ML IV SOLN
2.0000 g | Freq: Once | INTRAVENOUS | Status: AC
  Administered 2020-09-07: 2 g via INTRAVENOUS
  Filled 2020-09-07: qty 50

## 2020-09-07 MED ORDER — POTASSIUM CHLORIDE CRYS ER 20 MEQ PO TBCR
40.0000 meq | EXTENDED_RELEASE_TABLET | ORAL | Status: AC
  Administered 2020-09-07 (×2): 40 meq via ORAL
  Filled 2020-09-07 (×2): qty 2

## 2020-09-07 NOTE — Plan of Care (Signed)

## 2020-09-07 NOTE — Progress Notes (Signed)
We have been asked to see this patient again because of the possible need for colonoscopy.  She was seen earlier during this hospitalization by my partners but has been followed in the past by [facility] affiliated GI physicians in [place]..    This is a most unfortunate 64 year old female with multiple comorbidities including coronary disease, diabetes, CVA, seizure disorder admitted to the hospital about 2 weeks ago with diarrhea although she was Hemoccult negative and stool studies for enteric pathogens and C. difficile were also negative.  A CT scan of the abdomen at that time was negative for evidence of inflammatory bowel disease although did show evidence of cirrhosis, chronic calcific pancreatitis, and diverticulosis without diverticulitis.   The initial GI consultation raise the question of possible small bowel Crohn's disease although there was no mention on the CT report of ileitis and there was specifically noted the absence of inflammatory changes in the right lower quadrant.  Therefore, colonoscopy was proposed.    However, during the course of her hospitalization, her condition deteriorated with altered mental status; a head CT scan showed no acute changes but multiple remote infarcts and evidence of ischemic cerebral atrophy.  She was therefore transferred from [facility] to this hospital.  She was intubated due to respiratory failure on October 10 and was also hypotensive, requiring pressor support, without a clear explanation.  The patient was transferred out of the ICU 2 days ago but then became febrile and tachycardic again yesterday.  Repeat CT scan of the abdomen was negative for acute changes other than questionable early obstruction.  The patient's daughter, Suella Broad, who is at the bedside indicates that the patient has had abdominal pain "for a long time" but eats well at home.  At home, the patient is pretty much bed confined, able to get up with assistance and with  the help of a walker, to use the bathroom or for brief periods such as meals.  From talking with the nursing tech, the patient has not had diarrhea today nor does it appear there has been recent diarrhea from review of notes.  Exam: At this time, the patient has a temperature of 100.3 degrees and a heart rate of 109.  She is lying in bed, alert and in absolutely no distress.  She is minimally conversant, cannot really provide a history.  She is not very cooperative with exam, such as taking her temperature or taking deep breaths.  She is anicteric and without pallor.  She feels somewhat excessively warm.  The skin is dry, no diaphoresis.  The chest is grossly clear anteriorly, heart normal except for rapid rate, abdomen has active but nonobstructive bowel sounds, and is diffusely tender although soft and without peritoneal findings such as rebound or rigidity.  It is not clear whether the patient is simply "touchy" and moans reflexively when touched, or whether there is true abdominal tenderness.  No peripheral edema.  Impression:  1.  Admission with diarrhea, now resolved, stool studies negative  2.  No radiographic evidence for inflammatory bowel disease based on review of CT reports, although one of my partners had proposed colonoscopy earlier during this hospitalization.  She is Hemoccult negative which further goes against active IBD.  3.  Radiographic evidence for cirrhosis and calcific pancreatitis.  4.  Patient's daughter states that the patient has a history of reflux; currently on famotidine.  5.  Recent unexplained fevers  6.  Questionable early SBO by CT earlier today, but without vomiting  7.  Subjective versus objective abdominal tenderness on exam, significance unclear in this patient who is not able to communicate clearly  Recommendations:  1.  The patient most recently had colonoscopy 2 years ago which was negative per outside records.  I do not think that repeat colonoscopy at  this time is necessary, based on the patient's symptoms or radiographic findings.  2.  I think we should continue to monitor the patient for evolving bowel obstruction, which she apparently has a history of in the past.  I will obtain a KUB tomorrow.  Florencia Reasons, M.D. Pager [phone] If no answer or after 5 PM call [phone]

## 2020-09-07 NOTE — Progress Notes (Addendum)
PROGRESS NOTE    Amber [name]  EVO:[id number] DOB: Jun 02, 1956 DOA: 08/26/2020 PCP: Sandi Mariscal, MD   Brief Narrative: Amber [name] is a 64 y.o. female with a history of CAD, CVA, diabetes, SBO, hypertension, GERD, seizure disorder. She was admitted to [facility] on 10/7 complaining of diarrhea and weakness.  She was found to have an elevated lactate and elevated white count.  Stool studies were remarkably unremarkable including a stool guaiac which was negative.  CT scan of the abdomen was remarkable for pancreatic calcifications and thickening of the bowel wall most conspicuously in the ileum.  Blood and urine cultures obtained on admission were negative.  She was transiently supported with alpha agents and placed on stress dose steroids as she is chronically on no home steroids for asthma.  Her lactic acidosis has cleared and she is now off of pressors however on 10/9 she developed drooling and a right eye gaze and was felt to be having partial complex seizures.  Mental  status has not recovered.  CT scan of the head showed an old infarct with no acute changes. There was concern that she was suffering from some clinical status and she was transferred to this hospital for continuous EEG monitoring.  On arrival here she was not protecting her airway and was drooling and she was semiemergently intubated for airway protection.     Assessment & Plan:   Active Problems:   Hypokalemia   AKI (acute kidney injury) ([place])   Pressure injury of skin   Shock ([place])   Severe sepsis (HCC)   Renal failure   Encephalopathy   Chronic respiratory failure (HCC)   Acute respiratory failure with hypoxia Secondary to poor mental status in setting of multiple rounds of Ativan during episode of diagnosed subclinical status epilepticus. Patient intubated and managed on a ventilator. She has now been extubated and is on room air.  Shock Unknown etiology. Does not appear that patient meets sepsis criteria. Patient  required admission to the ICU on 10/7. She was intubated on 10/10 and managed on Levophed/Neosynephrine for vasopressor support. She was eventual extubated on 10/16 and transferred out of the ICU on 10/17. Hypotension additionally managed with midodrine and short course of Florinef for possible adrenal insufficiency. Currently resolved.  SIRS Patient met criteria on 10/18 with fever, tachycardia. No leukocytosis. Elevated Procalcitonin of 1.23. Unknown source. CT chest/abdomen/pelvis obtained without any specific source identified. Blood/urine cultures obtained and empiric antibiotics initiated which included Vancomycin and Zosyn. Patient has no symptoms on discussion -Continue Vancomycin and Zosyn -Follow-up blood and urine cultures -Trend fever curve  Seizures Neurology consulted. Multiple EEGs with findings related to encephalopathy. No acute stroke found on CT head/MRI brain imaging. -Neurology recommendations: Keppra 500 mg BID, oxcarbazepine 150 mg BID with plans to increase if seizure recurrence -Ativan IV 2 mg prn seizure-like activity  Acute metabolic encephalopathy Multifactorial with evidence of hyperammonemia in addition to patient being post-ictal. Patient's valproic acid was discontinued in setting of elevated ammonia.   Acute on Chronic anemia Baseline hemoglobin of 8. Patient transfused 2 units of PRBC on 10/8 for hemoglobin of 5.9 with significant improvement. Has been stable at 10. GI consulted and plan for colonoscopy. Hemoglobin dropped to 7.4 today. History of positive FOBT (08/15/20). Last FOBT (08/26/20) was negative. Patient has not had colonoscopy workup. -Daily CBC  Hypokalemia Kdur given.  Diarrhea CT abdomen/pelvis significant for evidence of diarrheal state, diverticulosis without diverticulitis. GI previously consulted and recommended colonoscopy -GI consult for inpatient management vs  outpatient recommendations  Dysphagia Patient is being evaluated by speech  therapy -Upgraded to dysphagia 2 diet  AKI Secondary to shock. Resolved with pressor support,   Thrombocytopenia Acute in setting of acute illness. Improved.  Hyperlipidemia Patient is on Lipitor as an outpatient. Held on admission.  Primary hypertension Patient is on amlodipine and metoprolol as an outpatient which are held on setting of shock. Patient is off vasopressors and blood pressure is currently mostly controlled. -Continue amlodipine, metoprolol  Chronic pancreatitis Patient is on Creon as an outpatient.  Gout Patient is on allopurinol as an outpatient.  Chronic pain Patient is on oxycodone as an outpatient -Oxycodone prn  Pressure injury Left, posterior shoulder, POA   DVT prophylaxis: SCDs Code Status:   Code Status: Full Code Family Communication: Called son with no response Disposition Plan: Discharge possibly to SNF pending PT evaluation in addition to GI recommendations in addition to fever workup   Consultants:   PCCM  Neurology  Gastroenterology  Procedures:   ETT (10/10>>10/16  EEG  Antimicrobials:  Vancomycin (10/7; 10/18>>  Zosyn (10/7>>10/13; 10/18>>  Rifaximin (10/13>>   Subjective: Has some back pain, otherwise no concerns  Objective: Vitals:   09/06/20 2300 09/07/20 0232 09/07/20 0400 09/07/20 0804  BP: 124/87  120/88   Pulse:      Resp: 18  18   Temp: 99.1 F (37.3 C)  98.6 F (37 C) 98.7 F (37.1 C)  TempSrc:   Oral Oral  SpO2:      Weight:  60 kg    Height:        Intake/Output Summary (Last 24 hours) at 09/07/2020 1312 Last data filed at 09/07/2020 0020 Gross per 24 hour  Intake 240 ml  Output 1150 ml  Net -910 ml   Filed Weights   09/05/20 0500 09/06/20 0446 09/07/20 0232  Weight: 63.8 kg 61.9 kg 60 kg    Examination:  General exam: Appears calm and comfortable Respiratory system: Clear to auscultation. Respiratory effort normal. Cardiovascular system: S1 & S2 heard, RRR. No murmurs, rubs,  gallops or clicks. Gastrointestinal system: Abdomen is nondistended, soft and nontender. No organomegaly or masses felt. Normal bowel sounds heard. Central nervous system: Alert and oriented to person and place. Not always coherent Musculoskeletal: No edema. No calf tenderness Skin: No cyanosis. No rashes Psychiatry: Judgement and insight appear impaired. Flat affect    Data Reviewed: I have personally reviewed following labs and imaging studies  CBC Lab Results  Component Value Date   WBC 7.0 09/07/2020   RBC 2.78 (L) 09/07/2020   HGB 7.4 (L) 09/07/2020   HCT 22.1 (L) 09/07/2020   MCV 79.5 (L) 09/07/2020   MCH 26.6 09/07/2020   PLT 139 (L) 09/07/2020   MCHC 33.5 09/07/2020   RDW 21.2 (H) 09/07/2020   LYMPHSABS 0.6 (L) 08/30/2020   MONOABS 0.2 08/30/2020   EOSABS 0.0 08/30/2020   BASOSABS 0.0 57/84/6962     Last metabolic panel Lab Results  Component Value Date   NA 138 09/06/2020   K 3.2 (L) 09/06/2020   CL 110 09/06/2020   CO2 22 09/06/2020   BUN 10 09/06/2020   CREATININE 0.54 09/06/2020   GLUCOSE 109 (H) 09/06/2020   GFRNONAA >60 09/06/2020   GFRAA 57 (L) 08/19/2020   CALCIUM 6.5 (L) 09/06/2020   PHOS 4.4 09/01/2020   PROT 5.3 (L) 09/05/2020   ALBUMIN 2.0 (L) 09/05/2020   BILITOT 8.1 (H) 09/05/2020   ALKPHOS 600 (H) 09/05/2020   AST 162 (H)  09/05/2020   ALT 64 (H) 09/05/2020   ANIONGAP 6 09/06/2020    CBG (last 3)  Recent Labs    09/07/20 0441 09/07/20 0802 09/07/20 1219  GLUCAP 96 82 100*     GFR: Estimated Creatinine Clearance: 59 mL/min (by C-G formula based on SCr of 0.54 mg/dL).  Coagulation Profile: Recent Labs  Lab 09/01/20 0549 09/04/20 0800  INR 1.4* 1.3*    No results found for this or any previous visit (from the past 240 hour(s)).      Radiology Studies: CT ABDOMEN PELVIS WO CONTRAST  Result Date: 09/07/2020 CLINICAL DATA:  Sepsis. EXAM: CT CHEST, ABDOMEN AND PELVIS WITHOUT CONTRAST TECHNIQUE: Multidetector CT imaging  of the chest, abdomen and pelvis was performed following the standard protocol without IV contrast. COMPARISON:  CT abdomen and pelvis 08/27/2020 FINDINGS: CT CHEST FINDINGS Cardiovascular: Mild cardiac enlargement. No pericardial effusions. Coronary artery and aortic calcifications. No aortic aneurysm. Right central venous catheter with tip at the cavoatrial junction. Mediastinum/Nodes: No significant lymphadenopathy. Esophagus is decompressed. Residual contrast material in the lower esophagus may indicate reflux or dysmotility. Lungs/Pleura: Motion artifact significantly limits evaluation. Suggestion of mild atelectasis in the lung bases. Possible perihilar infiltrates without obvious consolidation. No pleural effusions. Airways appear patent. Musculoskeletal: Postoperative changes in the thoracic spine. Degenerative changes in the spine and shoulders. Old rib fractures. Old appearing compression of T12. CT ABDOMEN PELVIS FINDINGS Hepatobiliary: Unenhanced appearance of the liver is unremarkable. Surgical absence of the gallbladder. No bile duct dilatation. Pancreas: Pancreas is atrophic with scattered calcifications, likely representing chronic pancreatitis. Spleen: Spleen size is normal. Adrenals/Urinary Tract: No adrenal gland nodules. Kidneys are symmetrical. No hydronephrosis or hydroureter. No stones. Bladder is decompressed with a Foley catheter. Stomach/Bowel: Stomach is unremarkable. Mildly dilated small bowel with decompression of the terminal ileum suggesting early small bowel obstruction. Transition zone appears to be in the right lower quadrant. Colon is mostly decompressed. Diverticulosis of the sigmoid colon without evidence of diverticulitis. Colonic inflammatory changes seen previously appear to of resolved. Vascular/Lymphatic: Aortic atherosclerosis. No enlarged abdominal or pelvic lymph nodes. Reproductive: Uterus and ovaries are not enlarged. Vascular calcifications of the uterus and adnexal  regions. Other: Small to moderate free fluid in the abdomen and pelvis. Mild subcutaneous edema. There appears to be a balloon in the gluteal crease which may represent expelled rectal catheter. Fat in the left inguinal canal, possibly a lipoma. Musculoskeletal: Diffuse fatty atrophy of the musculature. Diffuse bone demineralization. Compression fractures of L1 and L3 appear chronic. Degenerative changes in the lumbar spine and hips. Sclerosis in the right superior femoral head may indicate avascular necrosis. IMPRESSION: 1. Suggestion of mild atelectasis in the lung bases with possible perihilar infiltrates without obvious consolidation. 2. Mildly dilated small bowel with decompression of the terminal ileum suggesting early small bowel obstruction. Transition zone appears to be in the right lower quadrant. 3. Small to moderate free fluid in the abdomen and pelvis. 4. There appears to be a balloon in the gluteal crease which may represent expelled rectal catheter. 5. Chronic pancreatitis. 6. Sclerosis in the right superior femoral head may indicate avascular necrosis. 7. Fat in the left inguinal canal, possibly a lipoma. 8. Aortic atherosclerosis. 9. Diffuse fatty atrophy of the musculature. 10. Old appearing compression fractures of T12, L1, and L3. Aortic Atherosclerosis (ICD10-I70.0). Electronically Signed   By: Lucienne Capers M.D.   On: 09/07/2020 01:15   CT CHEST WO CONTRAST  Result Date: 09/07/2020 CLINICAL DATA:  Sepsis. EXAM: CT CHEST, ABDOMEN  AND PELVIS WITHOUT CONTRAST TECHNIQUE: Multidetector CT imaging of the chest, abdomen and pelvis was performed following the standard protocol without IV contrast. COMPARISON:  CT abdomen and pelvis 08/27/2020 FINDINGS: CT CHEST FINDINGS Cardiovascular: Mild cardiac enlargement. No pericardial effusions. Coronary artery and aortic calcifications. No aortic aneurysm. Right central venous catheter with tip at the cavoatrial junction. Mediastinum/Nodes: No  significant lymphadenopathy. Esophagus is decompressed. Residual contrast material in the lower esophagus may indicate reflux or dysmotility. Lungs/Pleura: Motion artifact significantly limits evaluation. Suggestion of mild atelectasis in the lung bases. Possible perihilar infiltrates without obvious consolidation. No pleural effusions. Airways appear patent. Musculoskeletal: Postoperative changes in the thoracic spine. Degenerative changes in the spine and shoulders. Old rib fractures. Old appearing compression of T12. CT ABDOMEN PELVIS FINDINGS Hepatobiliary: Unenhanced appearance of the liver is unremarkable. Surgical absence of the gallbladder. No bile duct dilatation. Pancreas: Pancreas is atrophic with scattered calcifications, likely representing chronic pancreatitis. Spleen: Spleen size is normal. Adrenals/Urinary Tract: No adrenal gland nodules. Kidneys are symmetrical. No hydronephrosis or hydroureter. No stones. Bladder is decompressed with a Foley catheter. Stomach/Bowel: Stomach is unremarkable. Mildly dilated small bowel with decompression of the terminal ileum suggesting early small bowel obstruction. Transition zone appears to be in the right lower quadrant. Colon is mostly decompressed. Diverticulosis of the sigmoid colon without evidence of diverticulitis. Colonic inflammatory changes seen previously appear to of resolved. Vascular/Lymphatic: Aortic atherosclerosis. No enlarged abdominal or pelvic lymph nodes. Reproductive: Uterus and ovaries are not enlarged. Vascular calcifications of the uterus and adnexal regions. Other: Small to moderate free fluid in the abdomen and pelvis. Mild subcutaneous edema. There appears to be a balloon in the gluteal crease which may represent expelled rectal catheter. Fat in the left inguinal canal, possibly a lipoma. Musculoskeletal: Diffuse fatty atrophy of the musculature. Diffuse bone demineralization. Compression fractures of L1 and L3 appear chronic.  Degenerative changes in the lumbar spine and hips. Sclerosis in the right superior femoral head may indicate avascular necrosis. IMPRESSION: 1. Suggestion of mild atelectasis in the lung bases with possible perihilar infiltrates without obvious consolidation. 2. Mildly dilated small bowel with decompression of the terminal ileum suggesting early small bowel obstruction. Transition zone appears to be in the right lower quadrant. 3. Small to moderate free fluid in the abdomen and pelvis. 4. There appears to be a balloon in the gluteal crease which may represent expelled rectal catheter. 5. Chronic pancreatitis. 6. Sclerosis in the right superior femoral head may indicate avascular necrosis. 7. Fat in the left inguinal canal, possibly a lipoma. 8. Aortic atherosclerosis. 9. Diffuse fatty atrophy of the musculature. 10. Old appearing compression fractures of T12, L1, and L3. Aortic Atherosclerosis (ICD10-I70.0). Electronically Signed   By: Lucienne Capers M.D.   On: 09/07/2020 01:15        Scheduled Meds: . amLODipine  10 mg Oral Daily  . chlorhexidine  15 mL Mouth Rinse BID  . Chlorhexidine Gluconate Cloth  6 each Topical Daily  . famotidine  20 mg Oral BID  . feeding supplement  237 mL Oral BID BM  . Gerhardt's butt cream   Topical BID  . insulin aspart  0-9 Units Subcutaneous Q4H  . lactulose  20 g Oral BID  . levETIRAcetam  500 mg Oral BID  . lipase/protease/amylase  24,000 Units Oral Q8H  . mouth rinse  15 mL Mouth Rinse q12n4p  . metoprolol tartrate  12.5 mg Oral BID  . multivitamin with minerals  1 tablet Oral Daily  . nystatin  5 mL Oral QID  . OXcarbazepine  150 mg Oral BID  . predniSONE  5 mg Oral Q breakfast  . rifaximin  550 mg Oral BID  . sodium chloride flush  10-40 mL Intracatheter Q12H   Continuous Infusions: . piperacillin-tazobactam (ZOSYN)  IV 3.375 g (09/07/20 1309)  . sodium chloride    . vancomycin 750 mg (09/07/20 0927)     LOS: 12 days     Cordelia Poche,  MD [facility] 09/07/2020, 1:12 PM  If 7PM-7AM, please contact night-coverage [email or web address]

## 2020-09-07 NOTE — Progress Notes (Signed)
  Speech Language Pathology Treatment: Dysphagia  Patient Details Name: Amber [name] MRN: [id number] DOB: [birth year] Today's Date: 09/07/2020 Time: 9528-4132 SLP Time Calculation (min) (ACUTE ONLY): 17 min  Assessment / Plan / Recommendation Clinical Impression  Pt was seen for dysphagia treatment. She demonstrates the appearance of a functional oropharyngeal swallow with puree and soft solids. She is edentulous and demonstrated prolonged mastication with a soft solid, but overall tolerated it well. Discussed differences between dys 1 and dys 2 diet with pt. Recommend diet be upgraded to dys 2, continue with thin liquids. Crush meds in puree.   HPI HPI: 64 y.o. F who presented with diarrhea, weakness and possible sepsis who had seizure-like activity and shock. No clear source of sepsis but question colitis, now off pressors. No acute CVA on CT.  Transferred to [facility] for Neuro eval and EEG.  Intubated from 10/10 to 10/16 for airway protection      SLP Plan  Continue with current plan of care       Recommendations  Diet recommendations: Dysphagia 2 (fine chop);Thin liquid Liquids provided via: Cup;Straw Medication Administration: Crushed with puree Supervision: Staff to assist with self feeding Compensations: Slow rate;Small sips/bites;Monitor for anterior loss;Minimize environmental distractions Postural Changes and/or Swallow Maneuvers: Seated upright 90 degrees                Oral Care Recommendations: Oral care BID Follow up Recommendations: None SLP Visit Diagnosis: Dysphagia, oral phase (R13.11) Plan: Continue with current plan of care       GO                Royetta Crochet 09/07/2020, 9:24 AM

## 2020-09-08 ENCOUNTER — Inpatient Hospital Stay (HOSPITAL_COMMUNITY): Payer: Medicaid Other

## 2020-09-08 DIAGNOSIS — L538 Other specified erythematous conditions: Secondary | ICD-10-CM

## 2020-09-08 DIAGNOSIS — R7989 Other specified abnormal findings of blood chemistry: Secondary | ICD-10-CM | POA: Diagnosis not present

## 2020-09-08 DIAGNOSIS — E876 Hypokalemia: Secondary | ICD-10-CM | POA: Diagnosis not present

## 2020-09-08 DIAGNOSIS — R579 Shock, unspecified: Secondary | ICD-10-CM | POA: Diagnosis not present

## 2020-09-08 DIAGNOSIS — E875 Hyperkalemia: Secondary | ICD-10-CM | POA: Diagnosis not present

## 2020-09-08 DIAGNOSIS — R609 Edema, unspecified: Secondary | ICD-10-CM

## 2020-09-08 DIAGNOSIS — E2740 Unspecified adrenocortical insufficiency: Secondary | ICD-10-CM

## 2020-09-08 DIAGNOSIS — N179 Acute kidney failure, unspecified: Secondary | ICD-10-CM | POA: Diagnosis not present

## 2020-09-08 LAB — GLUCOSE, CAPILLARY
Glucose-Capillary: 120 mg/dL — ABNORMAL HIGH (ref 70–99)
Glucose-Capillary: 146 mg/dL — ABNORMAL HIGH (ref 70–99)
Glucose-Capillary: 71 mg/dL (ref 70–99)
Glucose-Capillary: 82 mg/dL (ref 70–99)
Glucose-Capillary: 96 mg/dL (ref 70–99)
Glucose-Capillary: 98 mg/dL (ref 70–99)

## 2020-09-08 LAB — COMPREHENSIVE METABOLIC PANEL
ALT: 42 U/L (ref 0–44)
AST: 83 U/L — ABNORMAL HIGH (ref 15–41)
Albumin: 1.8 g/dL — ABNORMAL LOW (ref 3.5–5.0)
Alkaline Phosphatase: 434 U/L — ABNORMAL HIGH (ref 38–126)
Anion gap: 6 (ref 5–15)
BUN: 11 mg/dL (ref 8–23)
CO2: 24 mmol/L (ref 22–32)
Calcium: 8.3 mg/dL — ABNORMAL LOW (ref 8.9–10.3)
Chloride: 107 mmol/L (ref 98–111)
Creatinine, Ser: 0.9 mg/dL (ref 0.44–1.00)
GFR, Estimated: >60 mL/min (ref >60)
Glucose, Bld: 92 mg/dL (ref 70–99)
Potassium: 5.5 mmol/L — ABNORMAL HIGH (ref 3.5–5.1)
Sodium: 137 mmol/L (ref 135–145)
Total Bilirubin: 6.5 mg/dL — ABNORMAL HIGH (ref 0.3–1.2)
Total Protein: 5.4 g/dL — ABNORMAL LOW (ref 6.5–8.1)

## 2020-09-08 LAB — CBC
HCT: 19.4 % — ABNORMAL LOW (ref 36.0–46.0)
Hemoglobin: 6.5 g/dL — CL (ref 12.0–15.0)
MCH: 26.5 pg (ref 26.0–34.0)
MCHC: 33.5 g/dL (ref 30.0–36.0)
MCV: 79.2 fL — ABNORMAL LOW (ref 80.0–100.0)
Platelets: 171 10*3/uL (ref 150–400)
RBC: 2.45 MIL/uL — ABNORMAL LOW (ref 3.87–5.11)
RDW: 21.3 % — ABNORMAL HIGH (ref 11.5–15.5)
WBC: 10 10*3/uL (ref 4.0–10.5)
nRBC: 0 % (ref 0.0–0.2)

## 2020-09-08 LAB — HEMOGLOBIN AND HEMATOCRIT, BLOOD
HCT: 24 % — ABNORMAL LOW (ref 36.0–46.0)
Hemoglobin: 8 g/dL — ABNORMAL LOW (ref 12.0–15.0)

## 2020-09-08 LAB — URINE CULTURE: Culture: NO GROWTH

## 2020-09-08 LAB — D-DIMER, QUANTITATIVE: D-Dimer, Quant: 3.9 ug/mL-FEU — ABNORMAL HIGH (ref 0.00–0.50)

## 2020-09-08 LAB — PREPARE RBC (CROSSMATCH)

## 2020-09-08 MED ORDER — PANCRELIPASE (LIP-PROT-AMYL) 12000-38000 UNITS PO CPEP
36000.0000 [IU] | ORAL_CAPSULE | Freq: Three times a day (TID) | ORAL | Status: DC
  Administered 2020-09-08 – 2020-09-10 (×6): 36000 [IU] via ORAL
  Filled 2020-09-08 (×6): qty 3

## 2020-09-08 MED ORDER — SODIUM CHLORIDE 0.9 % IV SOLN: INTRAVENOUS | Status: AC

## 2020-09-08 MED ORDER — SODIUM CHLORIDE 0.9% IV SOLUTION: Freq: Once | INTRAVENOUS | Status: AC

## 2020-09-08 MED ORDER — VANCOMYCIN HCL 500 MG/100ML IV SOLN
500.0000 mg | Freq: Two times a day (BID) | INTRAVENOUS | Status: DC
  Administered 2020-09-08 – 2020-09-09 (×2): 500 mg via INTRAVENOUS
  Filled 2020-09-08 (×3): qty 100

## 2020-09-08 MED ORDER — SODIUM CHLORIDE 0.9 % IV SOLN: INTRAVENOUS | Status: DC

## 2020-09-08 NOTE — Progress Notes (Addendum)
[facility] Progress Note  Amber [name] 64 y.o. February 08, 1956  CC:  Diarrhea, ileus  Subjective: Patient denies any abdominal pain. States she is tolerating a diet (dysphagia 2) and denies nausea/vomiting.  Rectal tube in place with greenish-brown liquid stool.  No reported melena or hematochezia per flow sheet.  Patient working with PT and states she is having a lot of joint and muscle pain.  ROS : Review of Systems  Gastrointestinal: Positive for diarrhea. Negative for abdominal pain, blood in stool, constipation, heartburn, melena, nausea and vomiting.  Musculoskeletal: Positive for joint pain and myalgias.   Objective: Vital signs in last 24 hours: Vitals:   09/08/20 1313 09/08/20 1350  BP: (!) 116/96   Pulse: (!) 102 100  Resp: 16   Temp: 98.7 F (37.1 C) 98.8 F (37.1 C)  SpO2:      Physical Exam:  General:  Lethargic, chronically ill appearing, cooperative, no acute distress  Head:  Normocephalic, without obvious abnormality, atraumatic  Eyes:  Mild conjunctival pallor, EOMs intact  Lungs:   Clear to auscultation bilaterally, respirations unlabored  Heart:  Mildly tachycardic  Abdomen:   Soft, non-tender, non-distended, bowel sounds active all four quadrants, no guarding or peritoneal signs.  Extremities: Extremities normal, atraumatic, no  edema  Pulses: 2+ and symmetric    Lab Results: Recent Labs    09/06/20 0500 09/06/20 0500 09/06/20 2159 09/08/20 0500  NA 142   < > 138 137  K 3.7   < > 3.2* 5.5*  CL 109   < > 110 107  CO2 25   < > 22 24  GLUCOSE 129*   < > 109* 92  BUN 16   < > 10 11  CREATININE 0.69   < > 0.54 0.90  CALCIUM 7.8*   < > 6.5* 8.3*  MG 2.2  --  1.5*  --    < > = values in this interval not displayed.   Recent Labs    09/08/20 0500  AST 83*  ALT 42  ALKPHOS 434*  BILITOT 6.5*  PROT 5.4*  ALBUMIN 1.8*   Recent Labs    09/07/20 0034 09/08/20 0700  WBC 7.0 10.0  HGB 7.4* 6.5*  HCT 22.1* 19.4*  MCV 79.5* 79.2*   PLT 139* 171   No results for input(s): LABPROT, INR in the last 72 hours.    Assessment: Diarrhea: negative GI pathogen panel and C diff, possibly related to Lactulose. -C. difficile negative on 9/29 and 08/26/2020 -GI pathogen panel - 03/25/2020 -Patient with normal colonoscopy 2 years ago.   -CT 09/07/2020 revealed mildly dilated small bowel with decompression of the terminal ileum suggesting early small bowel obstruction. Transition zone appears to be in the right lower quadrant. -Elevated CRP (62.87) as of 08/28/20, though patient has history of gout and is currently reporting joint pain. Normal ESR -IBD remains in the differential but is less likely.  No active inflammation seen on CT imaging as of 09/07/2020.  Ileus: abdominal x-ray today revealed dilated gas-filled small bowel consistent with small bowel obstruction or ileus.  Dr. Cristina Gong reviewed image and feels this is most compatible with ileus. Patient doing well clinically and continues to have bowel movements. Denies abdominal pain, nausea, or vomiting.  Anemia: Hemoglobin 6.5 today, decreased from 7.4 yesterday and 9.8 on 10/18. Patient to receive 1u pRBCs. -BUN 11/ Cr 0.90 -No frank rectal bleeding or melena -FOBT positive on 08/15/2020 but FOBT negative on 08/26/2020  Cirrhosis -T bili 6.5/AST 83/ALT 42/ALP  434  Encephalopathy, most likely multifactorial On lactulose and rifaximin  Plan: Initiate Protonix 40 mg IV twice daily for anemia.  Fortunately, no frank rectal bleeding or melena.  Due to comorbidities, patient is not an ideal candidate for endoscopic/colonoscopic evaluation.  Thus, we will treat empirically at this time since patient remains hemodynamically stable.  Recommend repeat FOBT testing.  Patient with ileus per imaging.  Patient is doing well clinically, denies nausea and vomiting, denies abdominal pain, and is continuing to have bowel movements.  Continue dysphagia 2 diet, though we can consider clear  liquid diet if patient has worsening of symptoms.  We will repeat KUB tomorrow.  Recommend fecal calprotectin to rule out inflammatory bowel disease as etiology of diarrhea.  Repeat GI pathogen panel, as this was last completed on 03/25/2020.  However, this may be negative due to antibacterial coverage.  Continue lactulose, which can be titrated for 2-3 soft bowel movements per day.  Continue rifaximin.  Continue to monitor H&H with transfusion as needed to maintain Hgb >7.  We will continue to follow clinically.  Salley Slaughter PA-C 09/08/2020, 2:52 PM  Contact #  [phone]

## 2020-09-08 NOTE — Progress Notes (Signed)
CSW spoke with daughter of pt, Suella Broad, stated they would like [facility] services for pt, not interested in SNF at this time. CSW made RNCM aware.

## 2020-09-08 NOTE — Progress Notes (Signed)
Lower extremity venous bilateral study completed.   Please see CV Proc for preliminary results.   Sarafina Puthoff, RDMS  

## 2020-09-08 NOTE — Progress Notes (Signed)
Physical Therapy Treatment Patient Details Name: Amber [name] MRN: [id number] DOB: 02/12/1956 Today's Date: 09/08/2020    History of Present Illness This 64 y.o. female admitted 08/27/2020 with diarrhea and waekness. She was hypotensive on admission.  Dx > sepsis likely due to UTI vs. colitis.  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  She was transferred from [facility] to [facility] 10/10 for continuous EEG monitoring.  She was intubated 10/10-10/16.  MRI of brain on 10/12 showed no acute abnormality, but very advanced chronic ischemic diseases.   PMH includes:  CVA, CAD, DM SPO, HTN, GERD, Sz disorder, asthma    PT Comments    Pt with poor activity tolerance, limited by generalized pain and cognitive impairments. Pt perseverating on lying back down, going home or requesting warm blankets. Pt requiring two person total assist for all aspects of bed mobility; does not initiate movement whatsoever. PT performed PROM to BLE's. Continue to recommend SNF to maximize functional mobility and decrease caregiver burden.   Will need PTAR if pt family decides to d/c home.   Follow Up Recommendations  SNF;Supervision/Assistance - 24 hour     Equipment Recommendations  Other (comment) (hoyer lift)    Recommendations for Other Services       Precautions / Restrictions Precautions Precautions: Fall;Other (comment) Precaution Comments: rectal tube Restrictions Weight Bearing Restrictions: No Other Position/Activity Restrictions: Rectal tube and foley    Mobility  Bed Mobility Overal bed mobility: Needs Assistance Bed Mobility: Supine to Sit;Sit to Supine     Supine to sit: Total assist;+2 for safety/equipment;+2 for physical assistance     General bed mobility comments: Patient able to sit EOB despite requests to lie her back.  Transfers                 General transfer comment: unable to attempt this date   Ambulation/Gait                  Stairs             Wheelchair Mobility    Modified Rankin (Stroke Patients Only)       Balance Overall balance assessment: Needs assistance Sitting-balance support: No upper extremity supported Sitting balance-Leahy Scale: Poor Sitting balance - Comments: Pt requires total A, overall with occasional periods of mod A  Postural control: Right lateral lean                                  Cognition Arousal/Alertness: Awake/alert Behavior During Therapy: WFL for tasks assessed/performed Overall Cognitive Status: No family/caregiver present to determine baseline cognitive functioning Area of Impairment: Attention;Memory;Following commands;Safety/judgement;Awareness;Problem solving                   Current Attention Level: Focused Memory: Decreased short-term memory Following Commands: Follows one step commands consistently;Follows one step commands with increased time Safety/Judgement: Decreased awareness of safety Awareness: Intellectual Problem Solving: Slow processing;Decreased initiation;Difficulty sequencing;Requires verbal cues General Comments: Pt perseverates on c/o being cold, wanting a blanket, and wanting to go home.        Exercises General Exercises - Lower Extremity Ankle Circles/Pumps: PROM;Both;10 reps;Supine Long Arc Quad: AAROM;Both;5 reps;Seated Heel Slides: PROM;Both;10 reps;Supine    General Comments        Pertinent Vitals/Pain Pain Assessment: Faces Faces Pain Scale: Hurts whole lot Pain Location: generalized  Pain Descriptors / Indicators:  Grimacing;Moaning Pain Intervention(s): Limited activity within patient's tolerance;Monitored during session;Repositioned    Home Living                      Prior Function            PT Goals (current goals can now be found in the care plan section) Acute Rehab PT Goals Patient Stated Goal: "I want to go home" "I want a hot blanket  Potential  to Achieve Goals: Fair Progress towards PT goals: Not progressing toward goals - comment (limited by pain, fatigue)    Frequency    Min 3X/week      PT Plan Current plan remains appropriate    Co-evaluation PT/OT/SLP Co-Evaluation/Treatment: Yes Reason for Co-Treatment: Complexity of the patient's impairments (multi-system involvement);Necessary to address cognition/behavior during functional activity;For patient/therapist safety;To address functional/ADL transfers PT goals addressed during session: Mobility/safety with mobility;Balance OT goals addressed during session: ADL's and self-care;Strengthening/ROM      AM-PAC PT "6 Clicks" Mobility   Outcome Measure  Help needed turning from your back to your side while in a flat bed without using bedrails?: Total Help needed moving from lying on your back to sitting on the side of a flat bed without using bedrails?: Total Help needed moving to and from a bed to a chair (including a wheelchair)?: Total Help needed standing up from a chair using your arms (e.g., wheelchair or bedside chair)?: Total Help needed to walk in hospital room?: Total Help needed climbing 3-5 steps with a railing? : Total 6 Click Score: 6    End of Session   Activity Tolerance: Patient limited by pain Patient left: in bed;with call bell/phone within reach;with bed alarm set Nurse Communication: Mobility status PT Visit Diagnosis: Pain;Muscle weakness (generalized) (M62.81)     Time: 9371-6967 PT Time Calculation (min) (ACUTE ONLY): 30 min  Charges:  $Therapeutic Activity: 8-22 mins                     Lillia Pauls, PT, DPT Acute Rehabilitation Services Pager [phone] Office [phone]    Norval Morton 09/08/2020, 4:54 PM

## 2020-09-08 NOTE — Progress Notes (Signed)
[facility] PROGRESS NOTE    Progress Note  Amber [name]  ZOX:[id number] DOB: 03-29-1956 DOA: 08/26/2020 PCP: [name], MD     Brief Narrative:   Amber [name] is an 64 y.o. female past medical history of CAD, CVA diabetes mellitus small bowel obstruction seizure disorder admitted to hospital on 08/26/2020 complaining of diarrhea, CT scan of the abdomen pelvis was remarkable for pancreatic calcification and thickening of the bowel wall mainly in the ileum culture data has remained negative till date.  He was transiently supported with pressors and started on steroids his lactic acidosis cleared and was weaned off pressors on 08/28/2020.  She then developed drooling and right eyes gaze and was found to be a partial complex seizures, upon transferring she was drooling and not able to protect airway so she was intubated for airway protection  Assessment/Plan:   Acute respiratory failure with hypoxia: Secondary to poor mental status in the setting of not able to protect airway and subclinical status epilepticus. He was intubated and managed on the ventilator now 09/04/2020 and transferred to the floor on 09/05/2020.  Relative adrenal insufficiency leading to circulatory shock: Of unclear etiology does not appear to meet sepsis criteria. She required pressors on 08/29/2020 which were eventually weaned off.  She was managed with midodrine and Florinef for short course for possible adrenal insufficiency, Florinef was discontinued and she was put back on her home dose of prednisone (for rheumatoid arthritis)  SIRS: She did meet criteria with a procalcitonin of 1.3 no identifiable source work-up has been negative so far Blood cultures and urine cultures have been negative till date. She respiked a temperature on 09/06/2020, she was started on vancomycin and Zosyn on 09/06/2020, culture data was redrawn on 09/06/2020 and did remain negative till date. We will go ahead and check a D-dimer, if  positive will need to proceed with further work-up.  Seizure disorder: Neurology was consulted and multiple EEGs findings were related to encephalopathy no signs of stroke on CT MRI of the brain. Previously at home she was on Depakote, neurology recommended to start on Keppra 500 mg and oxcarbazepine given 50 twice daily. Ativan as needed for seizure-like activity.  Acute metabolic encephalopathy: Multifactorial with high ammonia levels.  Her valproic acid was discontinued in the setting of elevated ammonia level. Is currently on rifaximin and lactulose  Acute on chronic normocytic anemia: With a baseline creatinine was around 8 she was transfused 2 units of packed red blood cells on 09/06/2020. GI was consulted, her hemoglobin today dropped again, will transfuse an additional packed red blood cells. FOBT on 08/26/2020 was negative.  She got a colonoscopy 2 years ago he had recommended no further colonoscopy on this admission.  Acute diarrhea: Stool studies negative now resolved.  No radiographic evidence of inflammatory bowel disease.  Hemoccult has been negative. There is radiographic evidence of cirrhosis and pancreatic calcification she was started on Creon and as per the note she has not had for the diarrhea today. GI she had a colonoscopy 2 years prior to admission and outside record they do not recommend a colonoscopy. Did recommend a KUB for questionable or developing ileus, but the patient is tolerating her diet and has had a small bowel movement on her Flexi-Seal. We will discuss with GI to discontinue Flexi-Seal. Diarrhea is possibly due to antibiotics and chronic pancreatitis.  Hypo-kalemia/hyper kalemia: It was repleted, now high, will start on gentle IV fluids and recheck tomorrow morning.  Dysphagia: Continue dysphagia 2 diet.  Acute kidney injury: Likely due to circulatory shock now resolved.  Acute Thrombocytopenia: Likely due to acute illness now  resolved.  Hyperlipidemia: We will need to resume Lipitor as an outpatient.  Essential hypertension: Hypertensive medications were held on admission patient is currently off pressors and they were resumed currently on amlodipine and metoprolol.  Chronic pancreatitis: Continue Creon.  Gout: Continue allopurinol.  Chronic pain: Continue oxycodone.  Left posterior shoulder ulcer stage II present on admission: Patient is bedbound continue wound dressing. RN Pressure Injury Documentation: Pressure Injury 09/01/20 Shoulder Left;Posterior Stage 2 -  Partial thickness loss of dermis presenting as a shallow open injury with a red, pink wound bed without slough. Skin tear (Active)  09/01/20 0700  Location: Shoulder  Location Orientation: Left;Posterior  Staging: Stage 2 -  Partial thickness loss of dermis presenting as a shallow open injury with a red, pink wound bed without slough.  Wound Description (Comments): Skin tear  Present on Admission: Yes    Estimated body mass index is 24.54 kg/m as calculated from the following:   Height as of this encounter: 5' (1.524 m).   Weight as of this encounter: 57 kg.  DVT prophylaxis: SCD Family Communication:daughter Status is: Inpatient  Remains inpatient appropriate because:Hemodynamically unstable   Dispo: The patient is from: SNF              Anticipated d/c is to: SNF              Anticipated d/c date is: > 3 days              Patient currently is not medically stable to d/c.        Code Status:     Code Status Orders  (From admission, onward)         Start     Ordered   08/26/20 2214  Full code  Continuous        08/26/20 2214        Code Status History    Date Active Date Inactive Code Status Order ID Comments User Context   08/15/2020 2108 08/20/2020 1932 Full Code [id number]  Rometta Emery, MD ED   03/25/2020 0452 03/28/2020 0137 Full Code [id number]  John Giovanni, MD ED   02/13/2020 0010 02/14/2020 2302  Full Code [id number]  Briscoe Deutscher, MD Inpatient   12/25/2019 0306 12/31/2019 0724 Full Code [id number]  Milana Na, DO Inpatient   12/30/2018 0339 01/16/2019 1733 Full Code [id number]  Karl Ito, MD Inpatient   10/10/2018 2312 10/14/2018 2100 Full Code [id number]  Briscoe Deutscher, MD Inpatient   09/24/2018 2318 10/01/2018 1714 Full Code [id number]  Kathlene Cote, PA-C ED   05/06/2018 2245 05/14/2018 0044 Full Code [id number]  Eduard Clos, MD ED   11/19/2017 0000 11/26/2017 1751 Full Code [id number]  Reymundo Poll, MD ED   10/20/2017 2034 10/23/2017 2154 Full Code [id number]  Camelia Phenes, DO Inpatient   11/23/2016 1653 11/27/2016 1916 Full Code [id number]  Richarda Overlie, MD Inpatient   10/26/2016 0551 10/31/2016 2150 Full Code [id number]  Alberteen Sam, MD ED   05/25/2016 0714 05/26/2016 1530 Full Code [id number]  Eduard Clos, MD Inpatient   02/29/2016 0232 03/01/2016 1738 Full Code [id number]  Clydie Braun, MD Inpatient   12/24/2015 0400 12/25/2015 2111 Full Code [id number]  Lorretta Harp, MD ED   11/07/2015 0050 11/10/2015 1927 Full Code [id number]  Ron Parker, MD Inpatient   08/24/2015 0550 08/25/2015  2114 Full Code [id number]  Hillary Bow, DO ED   07/27/2015 0044 08/02/2015 1719 Full Code [id number]  Eduard Clos, MD Inpatient   06/12/2015 0426 06/14/2015 1603 Full Code [id number]  Eduard Clos, MD Inpatient   01/26/2015 [phone] 01/27/2015 1427 Full Code [id number]  Eduard Clos, MD Inpatient   Advance Care Planning Activity    Advance Directive Documentation     Most Recent Value  Type of Advance Directive Living will  Pre-existing out of facility DNR order (yellow form or pink MOST form) --  "MOST" Form in Place? --        IV Access:    Peripheral IV   Procedures and diagnostic studies:   CT ABDOMEN PELVIS WO CONTRAST  Result Date: 09/07/2020 CLINICAL DATA:  Sepsis. EXAM: CT CHEST, ABDOMEN AND PELVIS WITHOUT CONTRAST TECHNIQUE:  Multidetector CT imaging of the chest, abdomen and pelvis was performed following the standard protocol without IV contrast. COMPARISON:  CT abdomen and pelvis 08/27/2020 FINDINGS: CT CHEST FINDINGS Cardiovascular: Mild cardiac enlargement. No pericardial effusions. Coronary artery and aortic calcifications. No aortic aneurysm. Right central venous catheter with tip at the cavoatrial junction. Mediastinum/Nodes: No significant lymphadenopathy. Esophagus is decompressed. Residual contrast material in the lower esophagus may indicate reflux or dysmotility. Lungs/Pleura: Motion artifact significantly limits evaluation. Suggestion of mild atelectasis in the lung bases. Possible perihilar infiltrates without obvious consolidation. No pleural effusions. Airways appear patent. Musculoskeletal: Postoperative changes in the thoracic spine. Degenerative changes in the spine and shoulders. Old rib fractures. Old appearing compression of T12. CT ABDOMEN PELVIS FINDINGS Hepatobiliary: Unenhanced appearance of the liver is unremarkable. Surgical absence of the gallbladder. No bile duct dilatation. Pancreas: Pancreas is atrophic with scattered calcifications, likely representing chronic pancreatitis. Spleen: Spleen size is normal. Adrenals/Urinary Tract: No adrenal gland nodules. Kidneys are symmetrical. No hydronephrosis or hydroureter. No stones. Bladder is decompressed with a Foley catheter. Stomach/Bowel: Stomach is unremarkable. Mildly dilated small bowel with decompression of the terminal ileum suggesting early small bowel obstruction. Transition zone appears to be in the right lower quadrant. Colon is mostly decompressed. Diverticulosis of the sigmoid colon without evidence of diverticulitis. Colonic inflammatory changes seen previously appear to of resolved. Vascular/Lymphatic: Aortic atherosclerosis. No enlarged abdominal or pelvic lymph nodes. Reproductive: Uterus and ovaries are not enlarged. Vascular calcifications of  the uterus and adnexal regions. Other: Small to moderate free fluid in the abdomen and pelvis. Mild subcutaneous edema. There appears to be a balloon in the gluteal crease which may represent expelled rectal catheter. Fat in the left inguinal canal, possibly a lipoma. Musculoskeletal: Diffuse fatty atrophy of the musculature. Diffuse bone demineralization. Compression fractures of L1 and L3 appear chronic. Degenerative changes in the lumbar spine and hips. Sclerosis in the right superior femoral head may indicate avascular necrosis. IMPRESSION: 1. Suggestion of mild atelectasis in the lung bases with possible perihilar infiltrates without obvious consolidation. 2. Mildly dilated small bowel with decompression of the terminal ileum suggesting early small bowel obstruction. Transition zone appears to be in the right lower quadrant. 3. Small to moderate free fluid in the abdomen and pelvis. 4. There appears to be a balloon in the gluteal crease which may represent expelled rectal catheter. 5. Chronic pancreatitis. 6. Sclerosis in the right superior femoral head may indicate avascular necrosis. 7. Fat in the left inguinal canal, possibly a lipoma. 8. Aortic atherosclerosis. 9. Diffuse fatty atrophy of the musculature. 10. Old appearing compression fractures of T12, L1, and L3. Aortic Atherosclerosis (ICD10-I70.0). Electronically Signed  By: Burman Nieves M.D.   On: 09/07/2020 01:15   DG Abd 1 View  Result Date: 09/08/2020 CLINICAL DATA:  Small bowel obstruction EXAM: ABDOMEN - 1 VIEW COMPARISON:  08/30/2020 FINDINGS: Interval clearing of contrast material from the colon. Gas-filled nondistended colon. Mid abdominal gas-filled dilated small bowel. Changes are consistent with small bowel obstruction but could also indicate ileus. Surgical clips in the right upper quadrant. Postoperative changes in the thoracic spine. Degenerative changes in the lumbar spine and hips. Vascular calcifications. IMPRESSION: Dilated  gas-filled small bowel consistent with small bowel obstruction or ileus. Electronically Signed   By: Burman Nieves M.D.   On: 09/08/2020 05:54   CT CHEST WO CONTRAST  Result Date: 09/07/2020 CLINICAL DATA:  Sepsis. EXAM: CT CHEST, ABDOMEN AND PELVIS WITHOUT CONTRAST TECHNIQUE: Multidetector CT imaging of the chest, abdomen and pelvis was performed following the standard protocol without IV contrast. COMPARISON:  CT abdomen and pelvis 08/27/2020 FINDINGS: CT CHEST FINDINGS Cardiovascular: Mild cardiac enlargement. No pericardial effusions. Coronary artery and aortic calcifications. No aortic aneurysm. Right central venous catheter with tip at the cavoatrial junction. Mediastinum/Nodes: No significant lymphadenopathy. Esophagus is decompressed. Residual contrast material in the lower esophagus may indicate reflux or dysmotility. Lungs/Pleura: Motion artifact significantly limits evaluation. Suggestion of mild atelectasis in the lung bases. Possible perihilar infiltrates without obvious consolidation. No pleural effusions. Airways appear patent. Musculoskeletal: Postoperative changes in the thoracic spine. Degenerative changes in the spine and shoulders. Old rib fractures. Old appearing compression of T12. CT ABDOMEN PELVIS FINDINGS Hepatobiliary: Unenhanced appearance of the liver is unremarkable. Surgical absence of the gallbladder. No bile duct dilatation. Pancreas: Pancreas is atrophic with scattered calcifications, likely representing chronic pancreatitis. Spleen: Spleen size is normal. Adrenals/Urinary Tract: No adrenal gland nodules. Kidneys are symmetrical. No hydronephrosis or hydroureter. No stones. Bladder is decompressed with a Foley catheter. Stomach/Bowel: Stomach is unremarkable. Mildly dilated small bowel with decompression of the terminal ileum suggesting early small bowel obstruction. Transition zone appears to be in the right lower quadrant. Colon is mostly decompressed. Diverticulosis of the  sigmoid colon without evidence of diverticulitis. Colonic inflammatory changes seen previously appear to of resolved. Vascular/Lymphatic: Aortic atherosclerosis. No enlarged abdominal or pelvic lymph nodes. Reproductive: Uterus and ovaries are not enlarged. Vascular calcifications of the uterus and adnexal regions. Other: Small to moderate free fluid in the abdomen and pelvis. Mild subcutaneous edema. There appears to be a balloon in the gluteal crease which may represent expelled rectal catheter. Fat in the left inguinal canal, possibly a lipoma. Musculoskeletal: Diffuse fatty atrophy of the musculature. Diffuse bone demineralization. Compression fractures of L1 and L3 appear chronic. Degenerative changes in the lumbar spine and hips. Sclerosis in the right superior femoral head may indicate avascular necrosis. IMPRESSION: 1. Suggestion of mild atelectasis in the lung bases with possible perihilar infiltrates without obvious consolidation. 2. Mildly dilated small bowel with decompression of the terminal ileum suggesting early small bowel obstruction. Transition zone appears to be in the right lower quadrant. 3. Small to moderate free fluid in the abdomen and pelvis. 4. There appears to be a balloon in the gluteal crease which may represent expelled rectal catheter. 5. Chronic pancreatitis. 6. Sclerosis in the right superior femoral head may indicate avascular necrosis. 7. Fat in the left inguinal canal, possibly a lipoma. 8. Aortic atherosclerosis. 9. Diffuse fatty atrophy of the musculature. 10. Old appearing compression fractures of T12, L1, and L3. Aortic Atherosclerosis (ICD10-I70.0). Electronically Signed   By: Marisa Cyphers.D.  On: 09/07/2020 01:15     Medical Consultants:    None.  Anti-Infectives:   vanc and zosyn  Subjective:    Amber [name] she relates no abdominal pain, no nausea or vomiting, she relates she would like the Flexiseal out  Objective:    Vitals:   09/08/20 0005  09/08/20 0433 09/08/20 0434 09/08/20 0700  BP:  140/76 140/76 (!) 123/93  Pulse:   (!) 104   Resp:  15 15 15   Temp:  99.1 F (37.3 C) 99.1 F (37.3 C) 98.4 F (36.9 C)  TempSrc:  Oral Oral Oral  SpO2:  100%    Weight: 57 kg     Height:       SpO2: 100 % O2 Flow Rate (L/min): 2 L/min FiO2 (%): 30 %   Intake/Output Summary (Last 24 hours) at 09/08/2020 0853 Last data filed at 09/08/2020 09/10/2020 Gross per 24 hour  Intake 572.92 ml  Output 1300 ml  Net -727.08 ml   Filed Weights   09/06/20 0446 09/07/20 0232 09/08/20 0005  Weight: 61.9 kg 60 kg 57 kg    Exam: General exam: In no acute distress. Respiratory system: Good air movement and clear to auscultation. Cardiovascular system: S1 & S2 heard, RRR. No JVD. Gastrointestinal system: Abdomen is nondistended, soft and nontender.  Extremities: No pedal edema. Skin: No rashes, lesions or ulcers Psychiatry: Judgement and insight appear normal. Mood & affect appropriate.   Data Reviewed:    Labs: Basic Metabolic Panel: Recent Labs  Lab 09/02/20 0515 09/02/20 1527 09/04/20 0507 09/04/20 0507 09/05/20 0530 09/05/20 0530 09/06/20 0500 09/06/20 0500 09/06/20 2159 09/08/20 0500  NA 146*   < > 145  --  144  --  142  --  138 137  K 2.9*   < > 3.9   < > 3.3*   < > 3.7   < > 3.2* 5.5*  CL 111   < > 112*  --  108  --  109  --  110 107  CO2 22   < > 24  --  27  --  25  --  22 24  GLUCOSE 213*   < > 145*  --  87  --  129*  --  109* 92  BUN 26*   < > 25*  --  20  --  16  --  10 11  CREATININE 1.66*   < > 0.87  --  0.73  --  0.69  --  0.54 0.90  CALCIUM 8.8*   < > 8.4*  --  8.1*  --  7.8*  --  6.5* 8.3*  MG 2.5*  --  1.8  --  1.5*  --  2.2  --  1.5*  --    < > = values in this interval not displayed.   GFR Estimated Creatinine Clearance: 51.3 mL/min (by C-G formula based on SCr of 0.9 mg/dL). Liver Function Tests: Recent Labs  Lab 09/03/20 0411 09/04/20 0507 09/05/20 0530 09/08/20 0500  AST 155* 162* 162* 83*  ALT  51* 61* 64* 42  ALKPHOS 648* 652* 600* 434*  BILITOT 6.3* 6.1* 8.1* 6.5*  PROT 5.5* 5.2* 5.3* 5.4*  ALBUMIN 2.1* 2.1* 2.0* 1.8*   No results for input(s): LIPASE, AMYLASE in the last 168 hours. Recent Labs  Lab 09/02/20 0830 09/04/20 0507  AMMONIA 141* 94*   Coagulation profile Recent Labs  Lab 09/04/20 0800  INR 1.3*   COVID-19 Labs  No results for input(s):  DDIMER, FERRITIN, LDH, CRP in the last 72 hours.  Lab Results  Component Value Date   SARSCOV2NAA NEGATIVE 08/26/2020   SARSCOV2NAA NEGATIVE 08/15/2020   SARSCOV2NAA NEGATIVE 03/25/2020   SARSCOV2NAA NEGATIVE 02/12/2020    CBC: Recent Labs  Lab 09/03/20 0411 09/04/20 0507 09/05/20 0530 09/07/20 0034 09/08/20 0700  WBC 10.5 10.2 9.7 7.0 10.0  HGB 9.9* 9.0* 9.8* 7.4* 6.5*  HCT 28.6* 26.4* 28.0* 22.1* 19.4*  MCV 76.9* 77.6* 76.9* 79.5* 79.2*  PLT 51* 56* 75* 139* 171   Cardiac Enzymes: No results for input(s): CKTOTAL, CKMB, CKMBINDEX, TROPONINI in the last 168 hours. BNP (last 3 results) No results for input(s): PROBNP in the last 8760 hours. CBG: Recent Labs  Lab 09/07/20 1657 09/07/20 2108 09/08/20 0002 09/08/20 0436 09/08/20 0750  GLUCAP 107* 100* 96 71 82   D-Dimer: No results for input(s): DDIMER in the last 72 hours. Hgb A1c: No results for input(s): HGBA1C in the last 72 hours. Lipid Profile: No results for input(s): CHOL, HDL, LDLCALC, TRIG, CHOLHDL, LDLDIRECT in the last 72 hours. Thyroid function studies: No results for input(s): TSH, T4TOTAL, T3FREE, THYROIDAB in the last 72 hours.  Invalid input(s): FREET3 Anemia work up: No results for input(s): VITAMINB12, FOLATE, FERRITIN, TIBC, IRON, RETICCTPCT in the last 72 hours. Sepsis Labs: Recent Labs  Lab 09/04/20 0507 09/05/20 0530 09/06/20 2159 09/07/20 0034 09/07/20 0035 09/08/20 0700  PROCALCITON  --   --  1.23  --   --   --   WBC 10.2 9.7  --  7.0  --  10.0  LATICACIDVEN  --   --   --   --  1.0  --     Microbiology Recent Results (from the past 240 hour(s))  Culture, Urine     Status: None   Collection Time: 09/07/20 12:02 AM   Specimen: Urine, Random  Result Value Ref Range Status   Specimen Description URINE, RANDOM  Final   Special Requests NONE  Final   Culture   Final    NO GROWTH Performed at [facility], [address], [place], [place] [address]    Report Status 09/08/2020 FINAL  Final     Medications:   . amLODipine  10 mg Oral Daily  . chlorhexidine  15 mL Mouth Rinse BID  . Chlorhexidine Gluconate Cloth  6 each Topical Daily  . famotidine  20 mg Oral BID  . feeding supplement  237 mL Oral BID BM  . Gerhardt's butt cream   Topical BID  . insulin aspart  0-9 Units Subcutaneous Q4H  . lactulose  20 g Oral BID  . levETIRAcetam  500 mg Oral BID  . lipase/protease/amylase  24,000 Units Oral Q8H  . mouth rinse  15 mL Mouth Rinse q12n4p  . metoprolol tartrate  12.5 mg Oral BID  . multivitamin with minerals  1 tablet Oral Daily  . nystatin  5 mL Oral QID  . OXcarbazepine  150 mg Oral BID  . predniSONE  5 mg Oral Q breakfast  . rifaximin  550 mg Oral BID  . sodium chloride flush  10-40 mL Intracatheter Q12H   Continuous Infusions: . piperacillin-tazobactam (ZOSYN)  IV 3.375 g (09/08/20 0017)  . sodium chloride    . vancomycin 750 mg (09/07/20 2144)      LOS: 13 days   Marinda Elk  [facility]  09/08/2020, 8:53 AM

## 2020-09-08 NOTE — Progress Notes (Signed)
Lab called patient Hgb is 6.5 MD notified via secure chart.

## 2020-09-08 NOTE — Progress Notes (Signed)
PHARMACY NOTE:  ANTIMICROBIAL RENAL DOSAGE ADJUSTMENT  Current antimicrobial regimen includes a mismatch between antimicrobial dosage and estimated renal function.  As per policy approved by the Pharmacy & Therapeutics and Medical Executive Committees, the antimicrobial dosage will be adjusted accordingly.  Current antimicrobial dosage:  Vancomycin 750mg  IV q12h  Indication: infection of unknown source  Renal Function:  Estimated Creatinine Clearance: 51.3 mL/min (by C-G formula based on SCr of 0.9 mg/dL).  Antimicrobial dosage has been changed to:  Vancomycin 500mg  IV q12h  Additional comments: patient bedbound, watch SCr trend closely (0.54>>0.9 today).   , PharmD, BCPS Please check AMION for all [facility] contact numbers Clinical Pharmacist 09/08/2020 9:30 AM

## 2020-09-08 NOTE — Progress Notes (Signed)
Occupational Therapy Treatment Patient Details Name: Amber [name] MRN: [id number] DOB: Apr 09, 1956 Today's Date: 09/08/2020    History of present illness This 64 y.o. female admitted 08/27/2020 with diarrhea and waekness. She was hypotensive on admission.  Dx > sepsis likely due to UTI vs. colitis.  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  She was transferred from [facility] to [facility] 10/10 for continuous EEG monitoring.  She was intubated 10/10-10/16.  MRI of brain on 10/12 showed no acute abnormality, but very advanced chronic ischemic diseases.   PMH includes:  CVA, CAD, DM SPO, HTN, GERD, Sz disorder, asthma   OT comments  Patient complains of pain throughout treatment.  Requesting to lie down, go home, or requesting warm blankets.  Limited participation this date.  Co-treatment necessary to promote mobility, functional tasks and out of bed.  OT will continue to trial 2x/wk in the acute setting.  SNF appears her only option to regain independence for an eventual return home.      Follow Up Recommendations  SNF;Supervision/Assistance - 24 hour    Equipment Recommendations  Wheelchair cushion (measurements OT);Wheelchair (measurements OT);Hospital bed;3 in 1 bedside commode    Recommendations for Other Services      Precautions / Restrictions Precautions Precautions: Fall Precaution Comments: last HGB 6.5 Restrictions Weight Bearing Restrictions: No Other Position/Activity Restrictions: Rectal tube and foley       Mobility Bed Mobility Overal bed mobility: Needs Assistance Bed Mobility: Supine to Sit;Sit to Supine     Supine to sit: Total assist;+2 for safety/equipment;+2 for physical assistance     General bed mobility comments: Patient able to sit EOB despite requests to lie her back.  Transfers                      Balance Overall balance assessment: Needs assistance Sitting-balance support: No upper  extremity supported Sitting balance-Leahy Scale: Poor   Postural control: Right lateral lean                                 ADL either performed or assessed with clinical judgement   ADL       Grooming: Wash/dry hands;Wash/dry face;Oral care;Sitting;Bed level;Total assistance Grooming Details (indicate cue type and reason): sitting with complete setup, assist to maintain truck control, HOH for minimal attempt.             Lower Body Dressing: Total assistance;Bed level               Functional mobility during ADLs: Total assistance;+2 for physical assistance                             Pertinent Vitals/ Pain       Faces Pain Scale: Hurts whole lot Pain Location: generalized to everywhere Pain Descriptors / Indicators: Grimacing;Moaning Pain Intervention(s): Repositioned;Monitored during session                                                          Frequency  Min 2X/week        Progress Toward Goals  OT Goals(current goals can now be found in the  care plan section)  Progress towards OT goals: Not progressing toward goals - comment (Patient with continued pain limiting participation.)  Acute Rehab OT Goals Patient Stated Goal: "I want to go home" "I want a hot blanket  OT Goal Formulation: With patient Time For Goal Achievement: 09/20/20 Potential to Achieve Goals: Fair  Plan Discharge plan remains appropriate    Co-evaluation    PT/OT/SLP Co-Evaluation/Treatment: Yes Reason for Co-Treatment: Necessary to address cognition/behavior during functional activity;For patient/therapist safety;To address functional/ADL transfers   OT goals addressed during session: ADL's and self-care;Strengthening/ROM      AM-PAC OT "6 Clicks" Daily Activity     Outcome Measure   Help from another person eating meals?: Total Help from another person taking care of personal grooming?: Total Help from another person  toileting, which includes using toliet, bedpan, or urinal?: Total Help from another person bathing (including washing, rinsing, drying)?: Total Help from another person to put on and taking off regular upper body clothing?: Total Help from another person to put on and taking off regular lower body clothing?: Total 6 Click Score: 6    End of Session    OT Visit Diagnosis: Unsteadiness on feet (R26.81);Cognitive communication deficit (R41.841);Muscle weakness (generalized) (M62.81)   Activity Tolerance Patient limited by pain   Patient Left in bed;with call bell/phone within reach;with bed alarm set   Nurse Communication Other (comment) (patient checking in on patient - stated she had been given pain meds.)        Time: 8315-1761 OT Time Calculation (min): 29 min  Charges: OT General Charges $OT Visit: 1 Visit OT Treatments $Self Care/Home Management : 8-22 mins  09/08/2020  Rich, OTR/L  Acute Rehabilitation Services  Office:  [phone]    Suzanna Obey 09/08/2020, 3:26 PM

## 2020-09-08 NOTE — Progress Notes (Signed)
PT Cancellation Note  Patient Details Name: Karuna Balducci MRN: [id number] DOB: Apr 22, 1956   Cancelled Treatment:    Reason Eval/Treat Not Completed: Medical issues which prohibited therapy (Hgb 6.5, has yet to receive blood transfusion).  Lillia Pauls, PT, DPT Acute Rehabilitation Services Pager [phone] Office [phone]     Norval Morton 09/08/2020, 1:14 PM

## 2020-09-09 ENCOUNTER — Inpatient Hospital Stay (HOSPITAL_COMMUNITY): Payer: Medicaid Other

## 2020-09-09 DIAGNOSIS — N179 Acute kidney failure, unspecified: Secondary | ICD-10-CM | POA: Diagnosis not present

## 2020-09-09 DIAGNOSIS — E876 Hypokalemia: Secondary | ICD-10-CM | POA: Diagnosis not present

## 2020-09-09 DIAGNOSIS — E2740 Unspecified adrenocortical insufficiency: Secondary | ICD-10-CM | POA: Diagnosis not present

## 2020-09-09 DIAGNOSIS — R579 Shock, unspecified: Secondary | ICD-10-CM | POA: Diagnosis not present

## 2020-09-09 LAB — TYPE AND SCREEN
ABO/RH(D): O POS
Antibody Screen: NEGATIVE
Unit division: 0

## 2020-09-09 LAB — CBC
HCT: 24.1 % — ABNORMAL LOW (ref 36.0–46.0)
Hemoglobin: 8 g/dL — ABNORMAL LOW (ref 12.0–15.0)
MCH: 27 pg (ref 26.0–34.0)
MCHC: 33.2 g/dL (ref 30.0–36.0)
MCV: 81.4 fL (ref 80.0–100.0)
Platelets: 169 10*3/uL (ref 150–400)
RBC: 2.96 MIL/uL — ABNORMAL LOW (ref 3.87–5.11)
RDW: 19.3 % — ABNORMAL HIGH (ref 11.5–15.5)
WBC: 9.6 10*3/uL (ref 4.0–10.5)
nRBC: 0 % (ref 0.0–0.2)

## 2020-09-09 LAB — IRON AND TIBC: Iron: 61 ug/dL (ref 28–170)

## 2020-09-09 LAB — BPAM RBC
Blood Product Expiration Date: 202111222359
ISSUE DATE / TIME: 202110201327
Unit Type and Rh: 5100

## 2020-09-09 LAB — BASIC METABOLIC PANEL
Anion gap: 10 (ref 5–15)
BUN: 9 mg/dL (ref 8–23)
CO2: 19 mmol/L — ABNORMAL LOW (ref 22–32)
Calcium: 8.1 mg/dL — ABNORMAL LOW (ref 8.9–10.3)
Chloride: 107 mmol/L (ref 98–111)
Creatinine, Ser: 0.92 mg/dL (ref 0.44–1.00)
GFR, Estimated: >60 mL/min (ref >60)
Glucose, Bld: 109 mg/dL — ABNORMAL HIGH (ref 70–99)
Potassium: 4.5 mmol/L (ref 3.5–5.1)
Sodium: 136 mmol/L (ref 135–145)

## 2020-09-09 LAB — GLUCOSE, CAPILLARY
Glucose-Capillary: 95 mg/dL (ref 70–99)
Glucose-Capillary: 57 mg/dL — ABNORMAL LOW (ref 70–99)
Glucose-Capillary: 104 mg/dL — ABNORMAL HIGH (ref 70–99)
Glucose-Capillary: 94 mg/dL (ref 70–99)
Glucose-Capillary: 101 mg/dL — ABNORMAL HIGH (ref 70–99)
Glucose-Capillary: 122 mg/dL — ABNORMAL HIGH (ref 70–99)
Glucose-Capillary: 79 mg/dL (ref 70–99)

## 2020-09-09 LAB — FERRITIN: Ferritin: 1604 ng/mL — ABNORMAL HIGH (ref 11–307)

## 2020-09-09 LAB — OCCULT BLOOD X 1 CARD TO LAB, STOOL: Fecal Occult Bld: NEGATIVE

## 2020-09-09 MED ORDER — METOPROLOL TARTRATE 12.5 MG HALF TABLET
12.5000 mg | ORAL_TABLET | Freq: Two times a day (BID) | ORAL | Status: DC
  Administered 2020-09-09 – 2020-09-10 (×3): 12.5 mg via ORAL
  Filled 2020-09-09 (×3): qty 1

## 2020-09-09 MED ORDER — PROSOURCE PLUS PO LIQD: 30.0000 mL | Freq: Three times a day (TID) | ORAL | Status: DC

## 2020-09-09 NOTE — Progress Notes (Signed)
Nutrition Follow-up  DOCUMENTATION CODES:   Non-severe (moderate) malnutrition in context of chronic illness  INTERVENTION:   -Continue Magic cup TID with meals, each supplement provides 290 kcal and 9 grams of protein -Continue Ensure Enlive po BID, each supplement provides 350 kcal and 20 grams of protein -Continue MVI with minerals daily -30 ml Prosource Plus TID, each supplement provides 100 kcals and 15 grams protein  NUTRITION DIAGNOSIS:   Moderate Malnutrition related to chronic illness (CHF) as evidenced by mild fat depletion, moderate muscle depletion, mild muscle depletion.  Ongoing  GOAL:   Patient will meet greater than or equal to 90% of their needs  Progressing   MONITOR:   PO intake, Supplement acceptance, Diet advancement, Labs, Weight trends, Skin, I & O's  REASON FOR ASSESSMENT:   Ventilator, Consult Enteral/tube feeding initiation and management  ASSESSMENT:   63 year old female who presented to the ED on 10/07 with AMS. PMH of CAD, CVA, T2DM, SBO, HTN, GERD, seizures, CHF. Pt admitted with sepsis. CT abdomen/pelvis showing enteritis.  10/10 - transferred to [facility] for EEG, intubated 10/12- rectal tube placed 10/16- extubated 10/17- s/p BSE- advanced to dysphagia 1 diet with thin liquids 10/18- transferred fro ICU to PCU 10/19- s/p BSE- advanced to dysphagia 2 diet with thin liquids 10/20- KUB reveals ileus vs bowel obstruction; poor candidate for endoscopy per GI  Reviewed I/O's: +631 ml x 24 hours and -742 ml since admission  UOP: 1 L x 24 hours  Rectal tube output: 500 ml x 24 hours  Attempted to speak with pt via call to hospital room phone, however, no answer.   Per chart review, pt with poor appetite, eating minimal amounts. Noted meal completion 25-50%. She is taking 1-2 Ensure supplements daily.   Medications reviewed and include lactulose, creon, prednisone, and keppra.   Per TOC notes, pt family declining SNF at this time and would  like home health services at discharge.   Labs reviewed: CBGS: 79-122 (inpatient orders for glycemic control are 0-9 units insulin aspart every 4 hours).   Diet Order:   Diet Order            DIET DYS 2 Room service appropriate? Yes; Fluid consistency: Thin  Diet effective now                 EDUCATION NEEDS:   No education needs have been identified at this time  Skin:  Skin Assessment: Skin Integrity Issues: Skin Integrity Issues:: Stage II Stage I: - Stage II: lt shoulder Other: MASD to periuneum and groin  Last BM:  09/06/20 (via rectal tube)  Height:   Ht Readings from Last 1 Encounters:  08/29/20 5' (1.524 m)    Weight:   Wt Readings from Last 1 Encounters:  09/09/20 55.2 kg    BMI:  Body mass index is 23.77 kg/m.  Estimated Nutritional Needs:   Kcal:  1500-1700  Protein:  75-90 grams  Fluid:  > 1.5 L    Levada Schilling, RD, LDN, CDCES Registered Dietitian II Certified Diabetes Care and Education Specialist Please refer to [facility] for RD and/or RD on-call/weekend/after hours pager

## 2020-09-09 NOTE — Progress Notes (Signed)
[facility] Progress Note  Amber [name] 64 y.o. 03-27-1956  CC:  Diarrhea, ileus  Subjective: Patient denies any abdominal pain. States she is tolerating a diet (dysphagia 2) and denies nausea/vomiting.  Rectal tube in place with yellowish-brown liquid stool.  No reported melena or hematochezia per flow sheet.  Patient endorses back pain.  ROS : Review of Systems  Gastrointestinal: Positive for diarrhea. Negative for abdominal pain, blood in stool, constipation, heartburn, melena, nausea and vomiting.  Musculoskeletal: Positive for back pain and joint pain.   Objective: Vital signs in last 24 hours: Vitals:   09/09/20 0808 09/09/20 1000  BP: 132/87   Pulse: (!) 102 (!) 109  Resp: 19   Temp: 99 F (37.2 C) 98.7 F (37.1 C)  SpO2: 100%     Physical Exam:  General:  Lethargic, chronically ill appearing, cooperative, no acute distress  Head:  Normocephalic, without obvious abnormality, atraumatic  Eyes:  Mild conjunctival pallor, EOMs intact  Lungs:   Clear to auscultation bilaterally, respirations unlabored  Heart:  Mildly tachycardic  Abdomen:   Soft, non-tender, non-distended, bowel sounds active all four quadrants, no guarding or peritoneal signs.  Extremities: Extremities normal, atraumatic, no  edema  Pulses: 2+ and symmetric    Lab Results: Recent Labs    09/06/20 2159 09/06/20 2159 09/08/20 0500 09/09/20 0620  NA 138   < > 137 136  K 3.2*   < > 5.5* 4.5  CL 110   < > 107 107  CO2 22   < > 24 19*  GLUCOSE 109*   < > 92 109*  BUN 10   < > 11 9  CREATININE 0.54   < > 0.90 0.92  CALCIUM 6.5*   < > 8.3* 8.1*  MG 1.5*  --   --   --    < > = values in this interval not displayed.   Recent Labs    09/08/20 0500  AST 83*  ALT 42  ALKPHOS 434*  BILITOT 6.5*  PROT 5.4*  ALBUMIN 1.8*   Recent Labs    09/08/20 0700 09/08/20 0700 09/08/20 2030 09/09/20 0620  WBC 10.0  --   --  9.6  HGB 6.5*   < > 8.0* 8.0*  HCT 19.4*   < > 24.0* 24.1*  MCV  79.2*  --   --  81.4  PLT 171  --   --  169   < > = values in this interval not displayed.   No results for input(s): LABPROT, INR in the last 72 hours.    Assessment: Diarrhea: negative GI pathogen panel and C diff, possibly related to Lactulose. -C. difficile negative on 9/29 and 08/26/2020 -GI pathogen panel - 03/25/2020 -Patient with normal colonoscopy 2 years ago.   -CT 09/07/2020 revealed mildly dilated small bowel with decompression of the terminal ileum suggesting early small bowel obstruction. Transition zone appears to be in the right lower quadrant. -Elevated CRP (62.87) as of 08/28/20, though patient has history of gout and is currently reporting joint pain. Normal ESR -IBD remains in the differential but is less likely.  No active inflammation seen on CT imaging as of 09/07/2020.  Ileus, resolved: Abdominal x-ray today revealed scattered gas throughout the colon and small bowel without abnormal distention suggesting ileus.  Normocytic anemia: Hemoglobin 8.0 today, improved from 6.5 yesterday s/p 1u pRBCs. -No frank rectal bleeding or melena -FOBT positive on 08/15/2020 but FOBT negative on 08/26/2020 -Elevated ferritin, normal serum iron  Cirrhosis: MELD  score of 16 as of 09/04/20 -T bili 6.5/AST 83/ALT 42/ALP 434 as of 09/08/20  Encephalopathy, most likely multifactorial On lactulose and rifaximin  Plan: Continue Protonix 40 mg twice daily.    Continue lactulose, which can be titrated for 2-3 soft bowel movements per day.  Continue rifaximin.  Continue to monitor H&H with transfusion as needed to maintain Hgb >7.  No plan for endoscopy or colonoscopy unless persistent blood loss or acute, destabilizing bleeding.  Salley Slaughter PA-C 09/09/2020, 10:34 AM  Contact #  [phone]

## 2020-09-09 NOTE — Progress Notes (Signed)
[facility] PROGRESS NOTE    Progress Note  Amber [name]  ZOX:[id number] DOB: June 04, 1956 DOA: 08/26/2020 PCP: [name], MD     Brief Narrative:   Amber [name] is an 64 y.o. female past medical history of CAD, CVA diabetes mellitus small bowel obstruction seizure disorder admitted to hospital on 08/26/2020 complaining of diarrhea, CT scan of the abdomen pelvis was remarkable for pancreatic calcification and thickening of the bowel wall mainly in the ileum culture data has remained negative till date.  He was transiently supported with pressors and started on steroids his lactic acidosis cleared and was weaned off pressors on 08/28/2020.  She then developed drooling and right eyes gaze and was found to be a partial complex seizures, upon transferring she was drooling and not able to protect airway so she was intubated for airway protection  Assessment/Plan:   Acute respiratory failure with hypoxia: In the setting of poor mental status, not being able to protect her airway and subclinical status epilepticus. She was intubated and extubated on 09/04/2020 transferred to the floor on 09/05/2020 100% on room air.  Relative adrenal insufficiency leading to circulatory shock: She was not septic on admission. She required pressors on 08/29/2020 which were eventually weaned off.  She was managed with steroids, midodrine and Florinef for short course for possible adrenal insufficiency, Florinef was discontinued and she was put back on her home dose of prednisone (for rheumatoid arthritis)  SIRS: She did meet criteria with a procalcitonin of 1.3 no identifiable source work-up has been negative so far Blood cultures and urine cultures have been negative till date. She respiked a temperature on 09/06/2020, she was started on vancomycin and Zosyn on 09/06/2020, culture data was redrawn on 09/06/2020 and did remain negative till date. Lower extremity Doppler was negative for DVT.  Seizure  disorder: Neurology was consulted and multiple EEGs findings were related to encephalopathy no signs of stroke on CT MRI of the brain. Previously at home she was on Depakote, neurology recommended to start on Keppra 500 mg and oxcarbazepine given 50 twice daily and Depakote was discontinued. Ativan as needed for seizure-like activity. Is remained seizure-free.  Acute metabolic encephalopathy: Multifactorial with high ammonia levels.  Her valproic acid was discontinued in the setting of elevated ammonia level. Is currently on rifaximin and lactulose  Acute on chronic normocytic anemia: With a baseline hemoglobin was around 8 she was transfused 2 units of packed red blood cells on 09/06/2020.  Her hemoglobin dropped low 7 on 09/08/2020 she was transfused an additional units of packed red blood cells, and her hemoglobin has improved to 8.0. GI was consulted FOBT was negative they recommended a KUB for possible ileus with the patient is tolerating her diet and having regular bowel movements, they recommended no further GI work-up at this point in time for inflammatory bowel disease.  They relate the patient is a very poor candidate for an endoscopic or colonoscopic procedure due to her multiple comorbidities but if she destabilize they would be happy to reconsult. Discontinue Flexi-Seal  Acute diarrhea: Stool studies negative now resolved.  No radiographic evidence of inflammatory bowel disease.  Hemoccult has been negative. Diarrhea resolved with discontinuation of docusate MiraLAX. Discontinue Flexi-Seal.  Hypo-kalemia/hyper kalemia: It was repleted, now high, will start on gentle IV fluids and recheck tomorrow morning.  Dysphagia: Continue dysphagia 2 diet.  Acute kidney injury: Likely due to circulatory shock now resolved.  Acute Thrombocytopenia: Likely due to acute illness now resolved.  Hyperlipidemia: We will need  to resume Lipitor as an outpatient.  Essential  hypertension: Hypertensive medications were held on admission patient is currently off pressors and they were resumed currently on amlodipine and metoprolol.  Chronic pancreatitis: Continue Creon.  Gout: Continue allopurinol.  Chronic pain: Continue oxycodone.  Left posterior shoulder ulcer stage II present on admission: Patient is bedbound continue wound dressing. RN Pressure Injury Documentation: Pressure Injury 09/01/20 Shoulder Left;Posterior Stage 2 -  Partial thickness loss of dermis presenting as a shallow open injury with a red, pink wound bed without slough. Skin tear (Active)  09/01/20 0700  Location: Shoulder  Location Orientation: Left;Posterior  Staging: Stage 2 -  Partial thickness loss of dermis presenting as a shallow open injury with a red, pink wound bed without slough.  Wound Description (Comments): Skin tear  Present on Admission: Yes    Estimated body mass index is 23.77 kg/m as calculated from the following:   Height as of this encounter: 5' (1.524 m).   Weight as of this encounter: 55.2 kg.  DVT prophylaxis: SCD Family Communication:daughter Status is: Inpatient  Remains inpatient appropriate because:Hemodynamically unstable   Dispo: The patient is from: SNF              Anticipated d/c is to: SNF              Anticipated d/c date is: 1days              Patient currently is not medically stable to d/c.  Code Status:     Code Status Orders  (From admission, onward)         Start     Ordered   08/26/20 2214  Full code  Continuous        08/26/20 2214        Code Status History    Date Active Date Inactive Code Status Order ID Comments User Context   08/15/2020 2108 08/20/2020 1932 Full Code [id number]  Rometta Emery, MD ED   03/25/2020 0452 03/28/2020 0137 Full Code [id number]  John Giovanni, MD ED   02/13/2020 0010 02/14/2020 2302 Full Code [id number]  Briscoe Deutscher, MD Inpatient   12/25/2019 0306 12/31/2019 0724 Full Code [id number]   Milana Na, DO Inpatient   12/30/2018 0339 01/16/2019 1733 Full Code [id number]  Karl Ito, MD Inpatient   10/10/2018 2312 10/14/2018 2100 Full Code [id number]  Briscoe Deutscher, MD Inpatient   09/24/2018 2318 10/01/2018 1714 Full Code [id number]  Kathlene Cote, PA-C ED   05/06/2018 2245 05/14/2018 0044 Full Code [id number]  Eduard Clos, MD ED   11/19/2017 0000 11/26/2017 1751 Full Code [id number]  Reymundo Poll, MD ED   10/20/2017 2034 10/23/2017 2154 Full Code [id number]  Camelia Phenes, DO Inpatient   11/23/2016 1653 11/27/2016 1916 Full Code [id number]  Richarda Overlie, MD Inpatient   10/26/2016 0551 10/31/2016 2150 Full Code [id number]  Alberteen Sam, MD ED   05/25/2016 0714 05/26/2016 1530 Full Code [id number]  Eduard Clos, MD Inpatient   02/29/2016 0232 03/01/2016 1738 Full Code [id number]  Clydie Braun, MD Inpatient   12/24/2015 0400 12/25/2015 2111 Full Code [id number]  Lorretta Harp, MD ED   11/07/2015 0050 11/10/2015 1927 Full Code [id number]  Ron Parker, MD Inpatient   08/24/2015 0550 08/25/2015 2114 Full Code [id number]  Hillary Bow, DO ED   07/27/2015 0044 08/02/2015 1719 Full Code [id number]  Eduard Clos, MD Inpatient   06/12/2015 0426 06/14/2015 1603 Full Code  [id number]  Eduard Clos, MD Inpatient   01/26/2015 [phone] 01/27/2015 1427 Full Code [id number]  Eduard Clos, MD Inpatient   Advance Care Planning Activity    Advance Directive Documentation     Most Recent Value  Type of Advance Directive Living will  Pre-existing out of facility DNR order (yellow form or pink MOST form) --  "MOST" Form in Place? --        IV Access:    Peripheral IV   Procedures and diagnostic studies:   DG Abd 1 View  Result Date: 09/09/2020 CLINICAL DATA:  Ileus EXAM: ABDOMEN - 1 VIEW COMPARISON:  09/08/2020 FINDINGS: Scattered gas throughout the colon and small bowel. No bowel distention. Changes likely represent ileus. Mild decompression of  the colon since the previous study. Surgical clips in the right upper quadrant. No radiopaque stones. Vascular calcifications. IMPRESSION: Scattered gas throughout the colon and small bowel without abnormal distention suggesting ileus. Electronically Signed   By: Burman Nieves M.D.   On: 09/09/2020 05:54   DG Abd 1 View  Result Date: 09/08/2020 CLINICAL DATA:  Small bowel obstruction EXAM: ABDOMEN - 1 VIEW COMPARISON:  08/30/2020 FINDINGS: Interval clearing of contrast material from the colon. Gas-filled nondistended colon. Mid abdominal gas-filled dilated small bowel. Changes are consistent with small bowel obstruction but could also indicate ileus. Surgical clips in the right upper quadrant. Postoperative changes in the thoracic spine. Degenerative changes in the lumbar spine and hips. Vascular calcifications. IMPRESSION: Dilated gas-filled small bowel consistent with small bowel obstruction or ileus. Electronically Signed   By: Burman Nieves M.D.   On: 09/08/2020 05:54   VAS [place] LOWER EXTREMITY VENOUS (DVT)  Result Date: 09/08/2020  Lower Venous DVTStudy Indications: Edema, Erythema, and d-dimer.  Limitations: Poor ultrasound/tissue interface and patient intolerant to probe pressure, unable to reposition, and guarding. Comparison       12-26-2019 Prior bilateral lower extremity venous study Study:           available. Performing Technologist: Jean Rosenthal  Examination Guidelines: A complete evaluation includes B-mode imaging, spectral Doppler, color Doppler, and power Doppler as needed of all accessible portions of each vessel. Bilateral testing is considered an integral part of a complete examination. Limited examinations for reoccurring indications may be performed as noted. The reflux portion of the exam is performed with the patient in reverse Trendelenburg.  +---------+---------------+---------+-----------+----------+---------------+ RIGHT     CompressibilityPhasicitySpontaneityPropertiesThrombus Aging  +---------+---------------+---------+-----------+----------+---------------+ CFV      Full           Yes      Yes                                  +---------+---------------+---------+-----------+----------+---------------+ SFJ      Full                                                         +---------+---------------+---------+-----------+----------+---------------+ FV Prox                                               Not visualized  +---------+---------------+---------+-----------+----------+---------------+ FV Mid   Full                                                         +---------+---------------+---------+-----------+----------+---------------+  FV DistalFull                                                         +---------+---------------+---------+-----------+----------+---------------+ PFV      Full                                                         +---------+---------------+---------+-----------+----------+---------------+ POP      Full           Yes      Yes                                  +---------+---------------+---------+-----------+----------+---------------+ PTV      Full                                                         +---------+---------------+---------+-----------+----------+---------------+ PERO                                                  Patent by color +---------+---------------+---------+-----------+----------+---------------+   +---------+---------------+---------+-----------+----------+--------------+ LEFT     CompressibilityPhasicitySpontaneityPropertiesThrombus Aging +---------+---------------+---------+-----------+----------+--------------+ CFV      Full           Yes      Yes                                 +---------+---------------+---------+-----------+----------+--------------+ SFJ      Full                                                         +---------+---------------+---------+-----------+----------+--------------+ FV Prox  Full                                                        +---------+---------------+---------+-----------+----------+--------------+ FV Mid   Full                                                        +---------+---------------+---------+-----------+----------+--------------+ FV DistalFull                                                        +---------+---------------+---------+-----------+----------+--------------+  PFV      Full                                                        +---------+---------------+---------+-----------+----------+--------------+ POP                                                   Not visualized +---------+---------------+---------+-----------+----------+--------------+ PTV                                                   Not visualized +---------+---------------+---------+-----------+----------+--------------+ PERO                                                  Not visualized +---------+---------------+---------+-----------+----------+--------------+     Summary: RIGHT: - There is no evidence of deep vein thrombosis in the lower extremity. However, portions of this examination were limited- see technologist comments above.  - No cystic structure found in the popliteal fossa.  LEFT: - There is no evidence of deep vein thrombosis in the lower extremity. However, portions of this examination were limited- see technologist comments above.  - No cystic structure found in the popliteal fossa.  *See table(s) above for measurements and observations. Electronically signed by Coral Else MD on 09/08/2020 at 7:38:56 PM.    Final      Medical Consultants:    None.  Anti-Infectives:   vanc and zosyn  Subjective:    Amber [name] she denies any abdominal pain having regular watery stools, but her  appetite is improving.  She relates she is ready to get up and go home.  Objective:    Vitals:   09/09/20 0000 09/09/20 0400 09/09/20 0500 09/09/20 0808  BP:    132/87  Pulse: 100 (!) 103  (!) 102  Resp:    19  Temp: 97.8 F (36.6 C) 98 F (36.7 C)  99 F (37.2 C)  TempSrc: Oral Oral  Oral  SpO2: 100% 100%  100%  Weight:   55.2 kg   Height:       SpO2: 100 % O2 Flow Rate (L/min): 2 L/min FiO2 (%): 30 %   Intake/Output Summary (Last 24 hours) at 09/09/2020 0828 Last data filed at 09/09/2020 0321 Gross per 24 hour  Intake 2250.71 ml  Output 1500 ml  Net 750.71 ml   Filed Weights   09/07/20 0232 09/08/20 0005 09/09/20 0500  Weight: 60 kg 57 kg 55.2 kg    Exam: General exam: In no acute distress. Respiratory system: Good air movement and clear to auscultation. Cardiovascular system: S1 & S2 heard, RRR. No JVD. Gastrointestinal system: Abdomen is nondistended, soft and nontender.  Central nervous system: He is awake alert and oriented x3 no focal neurological deficits Extremities: No pedal edema. Skin: No rashes, lesions or ulcers Psychiatry: Judgment and insight appear normal mood and affect are appropriate  Data Reviewed:    Labs: Basic Metabolic Panel:  Recent Labs  Lab 09/04/20 0507 09/04/20 0507 09/05/20 0530 09/05/20 0530 09/06/20 0500 09/06/20 0500 09/06/20 2159 09/06/20 2159 09/08/20 0500 09/09/20 0620  NA 145   < > 144  --  142  --  138  --  137 136  K 3.9   < > 3.3*   < > 3.7   < > 3.2*   < > 5.5* 4.5  CL 112*   < > 108  --  109  --  110  --  107 107  CO2 24   < > 27  --  25  --  22  --  24 19*  GLUCOSE 145*   < > 87  --  129*  --  109*  --  92 109*  BUN 25*   < > 20  --  16  --  10  --  11 9  CREATININE 0.87   < > 0.73  --  0.69  --  0.54  --  0.90 0.92  CALCIUM 8.4*   < > 8.1*  --  7.8*  --  6.5*  --  8.3* 8.1*  MG 1.8  --  1.5*  --  2.2  --  1.5*  --   --   --    < > = values in this interval not displayed.   GFR Estimated Creatinine  Clearance: 49.4 mL/min (by C-G formula based on SCr of 0.92 mg/dL). Liver Function Tests: Recent Labs  Lab 09/03/20 0411 09/04/20 0507 09/05/20 0530 09/08/20 0500  AST 155* 162* 162* 83*  ALT 51* 61* 64* 42  ALKPHOS 648* 652* 600* 434*  BILITOT 6.3* 6.1* 8.1* 6.5*  PROT 5.5* 5.2* 5.3* 5.4*  ALBUMIN 2.1* 2.1* 2.0* 1.8*   No results for input(s): LIPASE, AMYLASE in the last 168 hours. Recent Labs  Lab 09/02/20 0830 09/04/20 0507  AMMONIA 141* 94*   Coagulation profile Recent Labs  Lab 09/04/20 0800  INR 1.3*   COVID-19 Labs  Recent Labs    09/08/20 1000 09/09/20 0620  DDIMER 3.90*  --   FERRITIN  --  1,604*    Lab Results  Component Value Date   SARSCOV2NAA NEGATIVE 08/26/2020   SARSCOV2NAA NEGATIVE 08/15/2020   SARSCOV2NAA NEGATIVE 03/25/2020   SARSCOV2NAA NEGATIVE 02/12/2020    CBC: Recent Labs  Lab 09/04/20 0507 09/04/20 0507 09/05/20 0530 09/07/20 0034 09/08/20 0700 09/08/20 2030 09/09/20 0620  WBC 10.2  --  9.7 7.0 10.0  --  9.6  HGB 9.0*   < > 9.8* 7.4* 6.5* 8.0* 8.0*  HCT 26.4*   < > 28.0* 22.1* 19.4* 24.0* 24.1*  MCV 77.6*  --  76.9* 79.5* 79.2*  --  81.4  PLT 56*  --  75* 139* 171  --  169   < > = values in this interval not displayed.   Cardiac Enzymes: No results for input(s): CKTOTAL, CKMB, CKMBINDEX, TROPONINI in the last 168 hours. BNP (last 3 results) No results for input(s): PROBNP in the last 8760 hours. CBG: Recent Labs  Lab 09/08/20 2150 09/09/20 0005 09/09/20 0441 09/09/20 0544 09/09/20 0806  GLUCAP 98 95 57* 104* 94   D-Dimer: Recent Labs    09/08/20 1000  DDIMER 3.90*   Hgb A1c: No results for input(s): HGBA1C in the last 72 hours. Lipid Profile: No results for input(s): CHOL, HDL, LDLCALC, TRIG, CHOLHDL, LDLDIRECT in the last 72 hours. Thyroid function studies: No results for input(s): TSH, T4TOTAL, T3FREE, THYROIDAB in the last 72  hours.  Invalid input(s): FREET3 Anemia work up: Recent Labs     09/09/20 0620  FERRITIN 1,604*  TIBC NOT CALCULATED  IRON 61   Sepsis Labs: Recent Labs  Lab 09/05/20 0530 09/06/20 2159 09/07/20 0034 09/07/20 0035 09/08/20 0700 09/09/20 0620  PROCALCITON  --  1.23  --   --   --   --   WBC 9.7  --  7.0  --  10.0 9.6  LATICACIDVEN  --   --   --  1.0  --   --    Microbiology Recent Results (from the past 240 hour(s))  Culture, Urine     Status: None   Collection Time: 09/07/20 12:02 AM   Specimen: Urine, Random  Result Value Ref Range Status   Specimen Description URINE, RANDOM  Final   Special Requests NONE  Final   Culture   Final    NO GROWTH Performed at [facility], [address] [place], [place] [address]    Report Status 09/08/2020 FINAL  Final  Culture, blood (routine x 2)     Status: None (Preliminary result)   Collection Time: 09/07/20  5:51 AM   Specimen: BLOOD LEFT HAND  Result Value Ref Range Status   Specimen Description BLOOD LEFT HAND  Final   Special Requests   Final    BOTTLES DRAWN AEROBIC AND ANAEROBIC Blood Culture adequate volume   Culture   Final    NO GROWTH 1 DAY Performed at [facility], [address], [place], [place] [address]    Report Status PENDING  Incomplete  Culture, blood (routine x 2)     Status: None (Preliminary result)   Collection Time: 09/07/20  6:02 AM   Specimen: BLOOD LEFT FOREARM  Result Value Ref Range Status   Specimen Description BLOOD LEFT FOREARM  Final   Special Requests   Final    BOTTLES DRAWN AEROBIC ONLY Blood Culture results may not be optimal due to an inadequate volume of blood received in culture bottles   Culture   Final    NO GROWTH 1 DAY Performed at [facility], [address] [place], [place] [address]    Report Status PENDING  Incomplete     Medications:   . amLODipine  10 mg Oral Daily  . chlorhexidine  15 mL Mouth Rinse BID  . Chlorhexidine Gluconate Cloth  6 each Topical Daily  . famotidine  20 mg Oral BID  . feeding supplement   237 mL Oral BID BM  . Gerhardt's butt cream   Topical BID  . insulin aspart  0-9 Units Subcutaneous Q4H  . lactulose  20 g Oral BID  . levETIRAcetam  500 mg Oral BID  . lipase/protease/amylase  36,000 Units Oral Q8H  . mouth rinse  15 mL Mouth Rinse q12n4p  . metoprolol tartrate  12.5 mg Oral BID  . multivitamin with minerals  1 tablet Oral Daily  . nystatin  5 mL Oral QID  . OXcarbazepine  150 mg Oral BID  . predniSONE  5 mg Oral Q breakfast  . rifaximin  550 mg Oral BID  . sodium chloride flush  10-40 mL Intracatheter Q12H   Continuous Infusions: . sodium chloride 100 mL/hr at 09/08/20 1840  . piperacillin-tazobactam (ZOSYN)  IV 3.375 g (09/09/20 2130)  . sodium chloride    . vancomycin 500 mg (09/09/20 0046)      LOS: 14 days   Marinda Elk  [facility]  09/09/2020, 8:28 AM

## 2020-09-09 NOTE — Progress Notes (Signed)
Rectal tube and foley d/c per order, patient tolerate well. Will continue to monitor.

## 2020-09-09 NOTE — TOC Progression Note (Signed)
Transition of Care [facility]) - Progression Note    Patient Details  Name: Amber [name] MRN: [id number] Date of Birth: September 25, 1956  Transition of Care [facility]) CM/SW Contact  Leone Haven, RN Phone Number: 09/09/2020, 5:16 PM  Clinical Narrative:    NCM spoke with Burtis Junes, NCM will leave agency list in the room for her brother to pick up and take to her , will discuss [facility] services with her tomorrow.  Informed her with Medicaid she may not gey many visits, and that she needs someone there with her 24 hrs /day. Burtis Junes said someone will be with her 24 hrs/day.     Barriers to Discharge: Continued Medical Work up  Expected Discharge Plan and Services                                                 Social Determinants of Health (SDOH) Interventions    Readmission Risk Interventions No flowsheet data found.

## 2020-09-10 DIAGNOSIS — K7200 Acute and subacute hepatic failure without coma: Secondary | ICD-10-CM

## 2020-09-10 LAB — BASIC METABOLIC PANEL
Anion gap: 7 (ref 5–15)
BUN: 8 mg/dL (ref 8–23)
CO2: 22 mmol/L (ref 22–32)
Calcium: 8.5 mg/dL — ABNORMAL LOW (ref 8.9–10.3)
Chloride: 105 mmol/L (ref 98–111)
Creatinine, Ser: 0.75 mg/dL (ref 0.44–1.00)
GFR, Estimated: >60 mL/min (ref >60)
Glucose, Bld: 84 mg/dL (ref 70–99)
Potassium: 4.3 mmol/L (ref 3.5–5.1)
Sodium: 134 mmol/L — ABNORMAL LOW (ref 135–145)

## 2020-09-10 LAB — GLUCOSE, CAPILLARY
Glucose-Capillary: 107 mg/dL — ABNORMAL HIGH (ref 70–99)
Glucose-Capillary: 137 mg/dL — ABNORMAL HIGH (ref 70–99)
Glucose-Capillary: 71 mg/dL (ref 70–99)
Glucose-Capillary: 93 mg/dL (ref 70–99)

## 2020-09-10 LAB — CALPROTECTIN, FECAL: Calprotectin, Fecal: <16 ug/g (ref 0–120)

## 2020-09-10 MED ORDER — LACTULOSE 10 GM/15ML PO SOLN
10.0000 g | Freq: Two times a day (BID) | ORAL | Status: DC
  Administered 2020-09-10: 10 g via ORAL
  Filled 2020-09-10: qty 15

## 2020-09-10 MED ORDER — PREDNISONE 5 MG PO TABS
5.0000 mg | ORAL_TABLET | Freq: Every day | ORAL | Status: DC
  Administered 2020-09-10: 5 mg via ORAL
  Filled 2020-09-10: qty 1

## 2020-09-10 MED ORDER — LACTULOSE 10 GM/15ML PO SOLN: 10.0000 g | Freq: Two times a day (BID) | ORAL | 0 refills | Status: AC

## 2020-09-10 MED ORDER — LEVETIRACETAM 500 MG PO TABS: 500.0000 mg | ORAL_TABLET | Freq: Two times a day (BID) | ORAL | 3 refills | Status: AC

## 2020-09-10 MED ORDER — OXCARBAZEPINE 150 MG PO TABS: 150.0000 mg | ORAL_TABLET | Freq: Two times a day (BID) | ORAL | 3 refills | Status: AC

## 2020-09-10 MED ORDER — RIFAXIMIN 550 MG PO TABS: 550.0000 mg | ORAL_TABLET | Freq: Two times a day (BID) | ORAL | 0 refills | Status: AC

## 2020-09-10 NOTE — TOC Transition Note (Addendum)
Transition of Care [facility]) - CM/SW Discharge Note   Patient Details  Name: Amber [name] MRN: [id number] Date of Birth: Mar 25, 1956  Transition of Care [facility]) CM/SW Contact:  Leone Haven, RN Phone Number: 09/10/2020, 9:50 AM   Clinical Narrative:     NCM called Shanta, no answer, NCM called son Antoinne, informed him patient is for dc today,  He states he will be transporting her home.  Asked if had preference for [facility] agency, no preference, NCM made referral to [facility] with Frances Furbish he states he can take referral for HHPT, HHOT.  Soc will begin next week. NCM contacted Shanta and updated her on this information.  10/22- Antoinne called and states something is wrong with his car and that they have some steps for patient to go up so would like to have ambulance transport.  NCM scheduled [facility] transport for patient.  Informed Staff RN Laveda Norman.    Final next level of care: Home w Home Health Services Barriers to Discharge: No Barriers Identified   Patient Goals and CMS Choice Patient states their goals for this hospitalization and ongoing recovery are:: get better CMS Medicare.gov Compare Post Acute Care list provided to:: Patient Represenative (must comment) Choice offered to / list presented to : Adult Children  Discharge Placement                       Discharge Plan and Services   Discharge Planning Services: CM Consult Post Acute Care Choice: Home Health            DME Agency: NA       HH Arranged: PT, OT HH Agency: [facility] Date [facility] Agency Contacted: 09/10/20 Time HH Agency Contacted: [phone] Representative spoke with at [facility] Agency: Kandee Keen  Social Determinants of Health (SDOH) Interventions Food Insecurity Interventions: Intervention Not Indicated Transportation Interventions: Intervention Not Indicated   Readmission Risk Interventions Readmission Risk Prevention Plan 09/10/2020  Transportation Screening Complete  Medication Review [employer])  Complete  PCP or Specialist appointment within 3-5 days of discharge Complete  HRI or Home Care Consult Complete  SW Recovery Care/Counseling Consult Complete  Palliative Care Screening Not Applicable  Skilled Nursing Facility Complete  Some recent data might be hidden

## 2020-09-10 NOTE — Plan of Care (Signed)
  Problem: Education: Goal: Knowledge of General Education information will improve Description Including pain rating scale, medication(s)/side effects and non-pharmacologic comfort measures Outcome: Progressing   

## 2020-09-10 NOTE — TOC Initial Note (Addendum)
Transition of Care [facility]) - Initial/Assessment Note    Patient Details  Name: Amber [name] MRN: [id number] Date of Birth: 1956/03/19  Transition of [facility] CM/SW Contact:    Leone Haven, RN Phone Number: 09/10/2020, 9:39 AM  Clinical Narrative:                 10/21- NCM spoke with Burtis Junes, NCM will leave agency list in the room for her brother to pick up and take to her , will discuss [facility] services with her tomorrow.  Informed her with Medicaid she may not gey many visits, and that she needs someone there with her 24 hrs /day. Burtis Junes said someone will be with her 24 hrs/day.  10/22-  NCM called Shanta, no answer, NCM called son Antoinne, informed him patient is for dc today,  He states he will be transporting her home.  Asked if had preference for [facility] agency, no preference, NCM made referral to [facility] with Frances Furbish he states he can take referral for HHPT, HHOT.  Soc will begin next week. NCM contacted Shanta and updated her on this information.    Expected Discharge Plan: Home w Home Health Services Barriers to Discharge: No Barriers Identified   Patient Goals and CMS Choice Patient states their goals for this hospitalization and ongoing recovery are:: get better CMS Medicare.gov Compare Post Acute Care list provided to:: Patient Represenative (must comment) Choice offered to / list presented to : Adult Children  Expected Discharge Plan and Services Expected Discharge Plan: Home w Home Health Services   Discharge Planning Services: CM Consult Post Acute Care Choice: Home Health Living arrangements for the past 2 months: Single Family Home Expected Discharge Date: 09/10/20                 DME Agency: NA       HH Arranged: PT, OT HH Agency: [facility] Date [facility] Agency Contacted: 09/10/20 Time HH Agency Contacted: [phone] Representative spoke with at [facility] Agency: Kandee Keen  Prior Living Arrangements/Services Living arrangements for the past 2 months: Single Family  Home Lives with:: Adult Children Patient language and need for interpreter reviewed:: Yes Do you feel safe going back to the place where you live?: Yes      Need for Family Participation in Patient Care: Yes (Comment) Care giver support system in place?: Yes (comment) Current home services: DME (has walker and w/chair) Criminal Activity/Legal Involvement Pertinent to Current Situation/Hospitalization: No - Comment as needed  Activities of Daily Living Home Assistive Devices/Equipment: Environmental consultant (specify type), Cane (specify quad or straight), Dentures (specify type) (upper and lower partial plates) ADL Screening (condition at time of admission) Patient's cognitive ability adequate to safely complete daily activities?: No Is the patient deaf or have difficulty hearing?: No Does the patient have difficulty seeing, even when wearing glasses/contacts?: No Does the patient have difficulty concentrating, remembering, or making decisions?: Yes Patient able to express need for assistance with ADLs?: Yes Does the patient have difficulty dressing or bathing?: Yes Independently performs ADLs?: No Communication: Independent Dressing (OT): Needs assistance Is this a change from baseline?: Pre-admission baseline Grooming: Needs assistance Is this a change from baseline?: Pre-admission baseline Feeding: Needs assistance Is this a change from baseline?: Pre-admission baseline Bathing: Needs assistance Is this a change from baseline?: Pre-admission baseline Toileting: Needs assistance Is this a change from baseline?: Pre-admission baseline In/Out Bed: Needs assistance Is this a change from baseline?: Pre-admission baseline Walks in Home: Dependent Is this a change from  baseline?: Pre-admission baseline Does the patient have difficulty walking or climbing stairs?: Yes Weakness of Legs: Both Weakness of Arms/Hands: Both  Permission Sought/Granted                  Emotional  Assessment Appearance:: Appears stated age Attitude/Demeanor/Rapport: Engaged Affect (typically observed): Appropriate Orientation: : Oriented to Self, Oriented to Place, Oriented to Situation Alcohol / Substance Use: Not Applicable Psych Involvement: No (comment)  Admission diagnosis:  Dehydration [E86.0] Shock (HCC) [R57.9] AKI (acute kidney injury) (HCC) [N17.9] Severe sepsis (HCC) [A41.9, R65.20] Patient Active Problem List   Diagnosis Date Noted  . Renal failure   . Encephalopathy   . Severe sepsis (HCC) 08/26/2020  . Colitis, acute 08/16/2020  . Sepsis due to gram-negative UTI (HCC) 08/15/2020  . UTI (urinary tract infection) 03/25/2020  . SIRS (systemic inflammatory response syndrome) (HCC) 03/25/2020  . Abdominal pain 03/25/2020  . Diarrhea 03/25/2020  . CVA (cerebral vascular accident) (HCC) 02/13/2020  . Hyperkalemia   . Stroke (HCC) 12/25/2019  . Acute ischemic stroke (HCC) 12/24/2019  . Chronic diastolic (congestive) heart failure (HCC) 02/05/2019  . CKD (chronic kidney disease) stage 3, GFR 30-59 ml/min 02/05/2019  . Chronic anticoagulation 02/05/2019  . Palliative care encounter   . Shock (HCC) 12/29/2018  . Pressure injury of skin 10/11/2018  . Sepsis due to pneumonia (HCC) 10/10/2018  . Failure to thrive in adult 10/10/2018  . Rheumatoid arthritis (HCC) 10/10/2018  . Anasarca 10/10/2018  . HCAP (healthcare-associated pneumonia) 09/24/2018  . Acute hepatic encephalopathy 05/09/2018  . Metabolic acidosis 05/09/2018  . Mild persistent asthma 05/09/2018  . 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 Moderate malnutrition (HCC) 05/07/2018  . Chronic anemia 05/06/2018  . PFO (patent foramen ovale) 02/15/2017  . History of CVA with residual deficit 02/15/2017  . Drug-induced hyperglycemia 02/15/2017  . Chest pain 11/23/2016  . Seizures (HCC) 10/26/2016  . 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 Sepsis (HCC) 02/29/2016  . AKI (acute kidney injury)  (HCC) 12/24/2015  . Constipation 11/07/2015  . Hypomagnesemia 08/25/2015  . Diabetes mellitus type 2, controlled (HCC) 07/27/2015  . Chronic back pain   . Frequent falls   . Hypertensive heart disease   . Pulmonary hypertension (HCC)   . Gout   . Acute respiratory failure with hypoxia (HCC) 06/12/2015  . Microcytic anemia 01/26/2015  . Hypokalemia 01/26/2015  . History of seizure 01/26/2015   PCP:  [name], MD Pharmacy:   [id number] - [place], [place] - [address] [place] [address] Phone: [phone] Fax: [phone]     Social Determinants of Health (SDOH) Interventions Food Insecurity Interventions: Intervention Not Indicated Transportation Interventions: Intervention Not Indicated  Readmission Risk Interventions Readmission Risk Prevention Plan 09/10/2020  Transportation Screening Complete  Medication Review (RN Care Manager) Complete  PCP or Specialist appointment within 3-5 days of discharge Complete  HRI or Home Care Consult Complete  SW Recovery Care/Counseling Consult Complete  Palliative Care Screening Not Applicable  Skilled Nursing Facility Complete  Some recent data might be hidden

## 2020-09-10 NOTE — Discharge Summary (Signed)
Physician Discharge Summary  Dayanara Sherrill ZOX:[id number] DOB: [birth year] DOA: 08/26/2020  PCP: [name], MD  Admit date: 08/26/2020 Discharge date: 09/10/2020  Admitted From: home Disposition:  Home  Recommendations for Outpatient Follow-up:  1. Follow up with PCP in 1-2 weeks 2. Please obtain BMP/CBC in one week 3.   Home Health:Yes Equipment/Devices:None  Discharge Condition:stable CODE STATUS:Full Diet recommendation: Heart Healthy  Brief/Interim Summary: 64 y.o. female past medical history of CAD, CVA diabetes mellitus small bowel obstruction seizure disorder admitted to hospital on 08/26/2020 complaining of diarrhea, CT scan of the abdomen pelvis was remarkable for pancreatic calcification and thickening of the bowel wall mainly in the ileum culture data has remained negative till date.  He was transiently supported with pressors and started on steroids his lactic acidosis cleared and was weaned off pressors on 08/28/2020.  She then developed drooling and right eyes gaze and was found to be a partial complex seizures, upon transferring she was drooling and not able to protect airway so she was intubated for airway protection  Discharge Diagnoses:  Active Problems:   Hypokalemia   Acute respiratory failure with hypoxia (HCC)   AKI (acute kidney injury) (HCC)   Acute hepatic encephalopathy   Pressure injury of skin   Shock (HCC)   Severe sepsis (HCC)   Renal failure   Encephalopathy  Acute respiratory failure failure with hypoxia: The setting for mental status and unable to protect airway on admission and possibly subclinical status epilepticus she was intubated on 09/04/2020 transferred to the ICU extubated on 09/05/2020 and satting 100% on room air when she was transferred to the regular floor  Relative adrenal insufficiency leading to circulatory shock: She is not septic on admission she required pressor on 08/29/2020 which eventually she was weaned off she was started on  stress dose steroids midodrine and Florinef her steroids were weaned to the lowest and midodrine and Florinef were discontinued.  SIRS: She did not meet criteria for sepsis on admission there were no identifiable source. Blood cultures have remained negative till date, lower extremity Doppler was negative for DVT.  Seizure disorder: Neurology was consulted who recommended multiple EEGs, CT and MRI of the brain showed no signs of stroke.  Previously she was on Depakote but due to her elevated ammonia level it was changed to Keppra and oxcarbazepine which she will take as an outpatient.  Cute hepatic encephalopathy: Ammonia level was really high on admission she was started on lactulose and rifaximin added mentation improved.  Acute on chronic normocytic anemia: With a baseline creatinine around 2 she was transfused 2 units of packed red blood cells her hemoglobin improved. GI was consulted FOBT was negative x2 KUB showed no ileus or obstruction. GI recommended no further work-up as an inpatient as there were no signs of inflammatory disease, she is a very poor candidate for endoscopic or colonoscopic procedures due to her multiple comorbidities and easily destabilization. On admission her Flexi-Seal had to be put in due to her diarrhea once her medications were modified there is resolved.  Acute diarrhea: Stool studies negative no radiographic evidence of inflammatory bowel disease Hemoccult was negative. Docusate and MiraLAX were discontinued her lactulose dose were decreased and her diarrhea resolved.  Electrolyte imbalance hypokalemia/hyperkalemia: They were treated accordingly her potassium remained stable.  Acute thrombocytopenia: Likely due to acute illness now resolved.  Acute kidney injury likely due to circulatory shock not resolved.  Essential hypertension: Antihypertensive medications were held on admission, Then she was resumed on low-dose  metoprolol which she will continue  as an outpatient.  Discharge Instructions  Discharge Instructions    Diet - low sodium heart healthy   Complete by: As directed    Discharge wound care:   Complete by: As directed    As above   Increase activity slowly   Complete by: As directed      Allergies as of 09/10/2020      Reactions   Ivp Dye [iodinated Diagnostic Agents] Itching   Metrizamide Itching      Medication List    STOP taking these medications   amLODipine 5 MG tablet Commonly known as: NORVASC   calcium-vitamin D 500-200 MG-UNIT tablet Commonly known as: OSCAL WITH D   senna-docusate 8.6-50 MG tablet Commonly known as: Senokot-S   valproic acid 250 MG capsule Commonly known as: DEPAKENE     TAKE these medications   acetaminophen 500 MG tablet Commonly known as: TYLENOL Take 2 tablets (1,000 mg total) by mouth every 8 (eight) hours. What changed:   when to take this  reasons to take this   albuterol 108 (90 Base) MCG/ACT inhaler Commonly known as: VENTOLIN HFA Inhale 2 puffs into the lungs every 4 (four) hours as needed for wheezing or shortness of breath.   allopurinol 300 MG tablet Commonly known as: ZYLOPRIM Take 300 mg by mouth daily.   apixaban 5 MG Tabs tablet Commonly known as: ELIQUIS Take 1 tablet (5 mg total) by mouth 2 (two) times daily.   atorvastatin 10 MG tablet Commonly known as: LIPITOR Take 10 mg by mouth daily at 6 PM.   Creon 24000-76000 units Cpep Generic drug: Pancrelipase (Lip-Prot-Amyl) Take 1 capsule by mouth 3 (three) times daily.   esomeprazole 40 MG capsule Commonly known as: NEXIUM Take 40 mg by mouth 2 (two) times daily before a meal.   folic acid 1 MG tablet Commonly known as: FOLVITE Take 1 mg by mouth daily.   lactulose 10 GM/15ML solution Commonly known as: CHRONULAC Take 15 mLs (10 g total) by mouth 2 (two) times daily.   levETIRAcetam 500 MG tablet Commonly known as: KEPPRA Take 1 tablet (500 mg total) by mouth 2 (two) times daily.    linaclotide 145 MCG Caps capsule Commonly known as: Linzess Take 1 capsule (145 mcg total) by mouth daily before breakfast. What changed:   when to take this  reasons to take this   metoprolol succinate 25 MG 24 hr tablet Commonly known as: TOPROL-XL Take 1 tablet (25 mg total) by mouth daily.   nystatin-triamcinolone ointment Commonly known as: MYCOLOG Apply 1 application topically 2 (two) times daily as needed for rash.   OXcarbazepine 150 MG tablet Commonly known as: TRILEPTAL Take 1 tablet (150 mg total) by mouth 2 (two) times daily.   Oxycodone HCl 20 MG Tabs Take 0.5 tablets (10 mg total) by mouth every 6 (six) hours as needed (severe pain). What changed:   how much to take  when to take this   polyethylene glycol 17 g packet Commonly known as: MIRALAX / GLYCOLAX Take 17 g by mouth daily. What changed:   when to take this  reasons to take this   predniSONE 5 MG tablet Commonly known as: DELTASONE Take 1 tablet (5 mg total) by mouth daily with breakfast.   rifaximin 550 MG Tabs tablet Commonly known as: XIFAXAN Take 1 tablet (550 mg total) by mouth 2 (two) times daily.            Discharge  Care Instructions  (From admission, onward)         Start     Ordered   09/10/20 0000  Discharge wound care:       Comments: As above   09/10/20 0810          Allergies  Allergen Reactions  . Ivp Dye [Iodinated Diagnostic Agents] Itching  . Metrizamide Itching    Consultations:  GI   Procedures/Studies: CT ABDOMEN PELVIS WO CONTRAST  Result Date: 09/07/2020 CLINICAL DATA:  Sepsis. EXAM: CT CHEST, ABDOMEN AND PELVIS WITHOUT CONTRAST TECHNIQUE: Multidetector CT imaging of the chest, abdomen and pelvis was performed following the standard protocol without IV contrast. COMPARISON:  CT abdomen and pelvis 08/27/2020 FINDINGS: CT CHEST FINDINGS Cardiovascular: Mild cardiac enlargement. No pericardial effusions. Coronary artery and aortic  calcifications. No aortic aneurysm. Right central venous catheter with tip at the cavoatrial junction. Mediastinum/Nodes: No significant lymphadenopathy. Esophagus is decompressed. Residual contrast material in the lower esophagus may indicate reflux or dysmotility. Lungs/Pleura: Motion artifact significantly limits evaluation. Suggestion of mild atelectasis in the lung bases. Possible perihilar infiltrates without obvious consolidation. No pleural effusions. Airways appear patent. Musculoskeletal: Postoperative changes in the thoracic spine. Degenerative changes in the spine and shoulders. Old rib fractures. Old appearing compression of T12. CT ABDOMEN PELVIS FINDINGS Hepatobiliary: Unenhanced appearance of the liver is unremarkable. Surgical absence of the gallbladder. No bile duct dilatation. Pancreas: Pancreas is atrophic with scattered calcifications, likely representing chronic pancreatitis. Spleen: Spleen size is normal. Adrenals/Urinary Tract: No adrenal gland nodules. Kidneys are symmetrical. No hydronephrosis or hydroureter. No stones. Bladder is decompressed with a Foley catheter. Stomach/Bowel: Stomach is unremarkable. Mildly dilated small bowel with decompression of the terminal ileum suggesting early small bowel obstruction. Transition zone appears to be in the right lower quadrant. Colon is mostly decompressed. Diverticulosis of the sigmoid colon without evidence of diverticulitis. Colonic inflammatory changes seen previously appear to of resolved. Vascular/Lymphatic: Aortic atherosclerosis. No enlarged abdominal or pelvic lymph nodes. Reproductive: Uterus and ovaries are not enlarged. Vascular calcifications of the uterus and adnexal regions. Other: Small to moderate free fluid in the abdomen and pelvis. Mild subcutaneous edema. There appears to be a balloon in the gluteal crease which may represent expelled rectal catheter. Fat in the left inguinal canal, possibly a lipoma. Musculoskeletal: Diffuse  fatty atrophy of the musculature. Diffuse bone demineralization. Compression fractures of L1 and L3 appear chronic. Degenerative changes in the lumbar spine and hips. Sclerosis in the right superior femoral head may indicate avascular necrosis. IMPRESSION: 1. Suggestion of mild atelectasis in the lung bases with possible perihilar infiltrates without obvious consolidation. 2. Mildly dilated small bowel with decompression of the terminal ileum suggesting early small bowel obstruction. Transition zone appears to be in the right lower quadrant. 3. Small to moderate free fluid in the abdomen and pelvis. 4. There appears to be a balloon in the gluteal crease which may represent expelled rectal catheter. 5. Chronic pancreatitis. 6. Sclerosis in the right superior femoral head may indicate avascular necrosis. 7. Fat in the left inguinal canal, possibly a lipoma. 8. Aortic atherosclerosis. 9. Diffuse fatty atrophy of the musculature. 10. Old appearing compression fractures of T12, L1, and L3. Aortic Atherosclerosis (ICD10-I70.0). Electronically Signed   By: Burman Nieves M.D.   On: 09/07/2020 01:15   CT ABDOMEN PELVIS WO CONTRAST  Result Date: 08/27/2020 CLINICAL DATA:  Acute generalized abdominal pain and distention. EXAM: CT ABDOMEN AND PELVIS WITHOUT CONTRAST TECHNIQUE: Multidetector CT imaging of the abdomen and pelvis  was performed following the standard protocol without IV contrast. COMPARISON:  August 15, 2020. FINDINGS: Lower chest: Mild bilateral pleural effusions are noted with adjacent subsegmental atelectasis. Hepatobiliary: No focal liver abnormality is seen. Status post cholecystectomy. No biliary dilatation. Pancreas: Calcifications are noted within the pancreas consistent with chronic pancreatitis. No acute abnormality is noted. Spleen: Normal in size without focal abnormality. Adrenals/Urinary Tract: Adrenal glands and kidneys are unremarkable. No hydronephrosis or renal obstruction is noted. No  renal or ureteral calculi are noted. Urinary bladder is decompressed secondary to Foley catheter. Stomach/Bowel: The stomach appears normal. The colon is mildly distended and fluid-filled. Sigmoid diverticulosis is noted without inflammation. The terminal ileum demonstrates circumferential wall thickening concerning for enteritis or other inflammation. There is mild dilatation of more proximal small bowel with fecalization concerning for some degree of obstruction. Potentially these findings may represent Crohn's disease. Vascular/Lymphatic: Aortic atherosclerosis. No enlarged abdominal or pelvic lymph nodes. Reproductive: Uterus and bilateral adnexa are unremarkable. Other: Moderate anasarca is noted. Minimal ascites is noted in the pelvis. Musculoskeletal: No acute or significant osseous findings. IMPRESSION: 1. Mild bilateral pleural effusions are noted with adjacent subsegmental atelectasis. 2. Sigmoid diverticulosis without inflammation. 3. Moderate anasarca is noted. 4. Calcifications are noted within the pancreas consistent with chronic pancreatitis. 5. The colon is mildly distended and fluid-filled. The terminal ileum demonstrates circumferential wall thickening concerning for enteritis or other inflammation. There is mild dilatation of more proximal small bowel with fecalization concerning for some degree of obstruction. Potentially these findings may represent Crohn's disease. 6. Minimal ascites is noted in the pelvis. 7. Aortic atherosclerosis. Aortic Atherosclerosis (ICD10-I70.0). Electronically Signed   By: Lupita Raider M.D.   On: 08/27/2020 14:46   CT ABDOMEN PELVIS WO CONTRAST  Result Date: 08/26/2020 CLINICAL DATA:  64 year old female with chest pain and shortness of breath. EXAM: CT CHEST, ABDOMEN AND PELVIS WITHOUT CONTRAST TECHNIQUE: Multidetector CT imaging of the chest, abdomen and pelvis was performed following the standard protocol without IV contrast. COMPARISON:  Chest radiograph  dated 08/26/2020 and CT of the abdomen pelvis dated 08/15/2020. CT dated 03/24/2020. FINDINGS: CT CHEST FINDINGS Evaluation of this exam is limited in the absence of intravenous contrast as well as due to streak artifact caused by spinal hardware. Cardiovascular: Top-normal cardiac size. No pericardial effusion. There is atherosclerotic calcification of the thoracic aorta. No aneurysmal dilatation. The central pulmonary arteries are grossly unremarkable. Mediastinum/Nodes: No hilar or mediastinal adenopathy. There is a small hiatal hernia. The esophagus is grossly unremarkable. No mediastinal fluid collection. Lungs/Pleura: Small bilateral pleural effusions. There is diffuse interstitial streaky and bandlike densities with peribronchial thickening, new since the CT of 03/24/2020 and may represent edema, or pneumonia or combination. Clinical correlation is recommended. There is no pneumothorax. The central airways are patent. Musculoskeletal: Degenerative changes of the spine and several bilateral old healed rib fractures. Extensive posterior spinal fusion and prior T4-T5 corpectomy. No acute osseous pathology. CT ABDOMEN PELVIS FINDINGS No intra-abdominal free air or free fluid. Hepatobiliary: Cirrhosis. No intrahepatic biliary ductal dilatation. Cholecystectomy. Pancreas: Moderate pancreatic atrophy with scattered coarse calcification sequela of chronic pancreatitis. No active inflammatory changes. Spleen: Normal in size without focal abnormality. Adrenals/Urinary Tract: The adrenal glands unremarkable. The kidneys, visualized ureters, and urinary bladder appear unremarkable. Stomach/Bowel: There is a small hiatal hernia. There is sigmoid diverticulosis without active inflammatory changes. Loose stool noted throughout the colon compatible with diarrheal state. Correlation with clinical exam and stool cultures recommended. There is no bowel obstruction. The appendix is not  visualized with certainty. No inflammatory  changes identified in the right lower quadrant. Vascular/Lymphatic: Advanced aortoiliac atherosclerotic disease. No portal venous gas. There is no adenopathy. Reproductive: The uterus is grossly unremarkable. No adnexal masses. Other: Diffuse subcutaneous soft tissue edema of the pelvis and buttock. No fluid collection. Musculoskeletal: Osteopenia with degenerative changes of the spine. No acute osseous pathology. 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 Old compression fracture of the L1 and L2 and L3. IMPRESSION: 1. Small bilateral pleural effusions with diffuse interstitial streaky and bandlike densities, new since the CT of 03/24/2020 and may represent edema, or pneumonia or combination. Clinical correlation is recommended. 2. Diarrheal state. Correlation with clinical exam and stool cultures recommended. No bowel obstruction. 3. Sigmoid diverticulosis. 4. Cirrhosis. 5. Aortic Atherosclerosis (ICD10-I70.0). Electronically Signed   By: Elgie Collard M.D.   On: 08/26/2020 19:36   CT Abdomen Pelvis Wo Contrast  Result Date: 08/15/2020 CLINICAL DATA:  Abdominal pain, fever and generalized body aches. Diarrhea. EXAM: CT ABDOMEN AND PELVIS WITHOUT CONTRAST TECHNIQUE: Multidetector CT imaging of the abdomen and pelvis was performed following the standard protocol without IV contrast. COMPARISON:  03/24/2020 FINDINGS: Lower chest: The lung bases are clear of an acute process. Chronic lung changes are noted. The heart is normal in size. Prominent pericardial fat. The distal esophagus is grossly normal. Hepatobiliary: No hepatic lesions are identified without contrast. The gallbladder is surgically absent. No biliary dilatation. Pancreas: Stable advanced pancreatic atrophy and diffuse calcifications consistent with chronic calcific pancreatitis. No acute inflammatory changes. Spleen: Normal size.  No focal lesions. Adrenals/Urinary Tract: The adrenal glands and kidneys are  unremarkable. No renal lesions, hydronephrosis or obstructing ureteral calculi. The bladder is unremarkable. Stomach/Bowel: The stomach, duodenum and small bowel are unremarkable. No acute inflammatory changes, mass lesions or obstructive findings. Suspect inflammation of the ascending, transverse and descending colon suggesting an inflammatory or infectious colitis. Scattered colonic diverticuli but no evidence of acute diverticulitis. The sigmoid colon and rectum are grossly normal. Vascular/Lymphatic: Advanced vascular calcifications but no aneurysm. No mesenteric or retroperitoneal mass or adenopathy. Reproductive: The uterus and ovaries are stable. Extensive calcifications are noted. Other: No pelvic mass or adenopathy. No free pelvic fluid collections. No inguinal mass or adenopathy. No abdominal wall hernia or subcutaneous lesions. Musculoskeletal: Advanced fatty atrophy of the hip and pelvic musculature. No acute bony findings. Osteoporosis and remote fractures involving T12, L1 and L3. IMPRESSION: 1. Suspect an inflammatory or infectious colitis. 2. No other significant abdominal/pelvic findings, mass lesions or adenopathy. 3. Stable advanced pancreatic atrophy and diffuse calcifications consistent with chronic calcific pancreatitis. 4. Status post cholecystectomy. No biliary dilatation. 5. Aortic atherosclerosis. Aortic Atherosclerosis (ICD10-I70.0). Electronically Signed   By: Rudie Meyer M.D.   On: 08/15/2020 16:05   DG Abd 1 View  Result Date: 09/09/2020 CLINICAL DATA:  Ileus EXAM: ABDOMEN - 1 VIEW COMPARISON:  09/08/2020 FINDINGS: Scattered gas throughout the colon and small bowel. No bowel distention. Changes likely represent ileus. Mild decompression of the colon since the previous study. Surgical clips in the right upper quadrant. No radiopaque stones. Vascular calcifications. IMPRESSION: Scattered gas throughout the colon and small bowel without abnormal distention suggesting ileus.  Electronically Signed   By: Burman Nieves M.D.   On: 09/09/2020 05:54   DG Abd 1 View  Result Date: 09/08/2020 CLINICAL DATA:  Small bowel obstruction EXAM: ABDOMEN - 1 VIEW COMPARISON:  08/30/2020 FINDINGS: Interval clearing of contrast material from the colon. Gas-filled nondistended colon. Mid abdominal gas-filled  dilated small bowel. Changes are consistent with small bowel obstruction but could also indicate ileus. Surgical clips in the right upper quadrant. Postoperative changes in the thoracic spine. Degenerative changes in the lumbar spine and hips. Vascular calcifications. IMPRESSION: Dilated gas-filled small bowel consistent with small bowel obstruction or ileus. Electronically Signed   By: Burman Nieves M.D.   On: 09/08/2020 05:54   DG Abd 1 View  Result Date: 08/30/2020 CLINICAL DATA:  NG tube placement. EXAM: ABDOMEN - 1 VIEW COMPARISON:  03/29/2020 FINDINGS: NG tube tip is in the distal stomach. No gaseous distention of stomach, small bowel or colon. Contrast material in the colon is compatible with recent abdomen/pelvis CT. IMPRESSION: NG tube tip is in the distal stomach. Electronically Signed   By: Kennith Center M.D.   On: 08/30/2020 06:50   CT Head Wo Contrast  Result Date: 08/26/2020 CLINICAL DATA:  Altered mental status. EXAM: CT HEAD WITHOUT CONTRAST TECHNIQUE: Contiguous axial images were obtained from the base of the skull through the vertex without intravenous contrast. COMPARISON:  02/12/2020 FINDINGS: Brain: Chronic atrophic changes and chronic white matter ischemic changes are seen similar to that noted on the prior exam. Encephalomalacia changes are noted primarily on the right related to areas of prior infarct in the distribution of the right middle cerebral artery. No findings to suggest acute hemorrhage, acute infarction or space-occupying mass lesion are noted. Vascular: No hyperdense vessel or unexpected calcification. Skull: Normal. Negative for fracture or focal  lesion. Sinuses/Orbits: No acute finding. Other: None. IMPRESSION: Chronic atrophic and ischemic changes. Encephalomalacia changes consistent with the known history of prior right MCA infarct. No acute abnormality is noted. Electronically Signed   By: Alcide Clever M.D.   On: 08/26/2020 19:30   CT CHEST WO CONTRAST  Result Date: 09/07/2020 CLINICAL DATA:  Sepsis. EXAM: CT CHEST, ABDOMEN AND PELVIS WITHOUT CONTRAST TECHNIQUE: Multidetector CT imaging of the chest, abdomen and pelvis was performed following the standard protocol without IV contrast. COMPARISON:  CT abdomen and pelvis 08/27/2020 FINDINGS: CT CHEST FINDINGS Cardiovascular: Mild cardiac enlargement. No pericardial effusions. Coronary artery and aortic calcifications. No aortic aneurysm. Right central venous catheter with tip at the cavoatrial junction. Mediastinum/Nodes: No significant lymphadenopathy. Esophagus is decompressed. Residual contrast material in the lower esophagus may indicate reflux or dysmotility. Lungs/Pleura: Motion artifact significantly limits evaluation. Suggestion of mild atelectasis in the lung bases. Possible perihilar infiltrates without obvious consolidation. No pleural effusions. Airways appear patent. Musculoskeletal: Postoperative changes in the thoracic spine. Degenerative changes in the spine and shoulders. Old rib fractures. Old appearing compression of T12. CT ABDOMEN PELVIS FINDINGS Hepatobiliary: Unenhanced appearance of the liver is unremarkable. Surgical absence of the gallbladder. No bile duct dilatation. Pancreas: Pancreas is atrophic with scattered calcifications, likely representing chronic pancreatitis. Spleen: Spleen size is normal. Adrenals/Urinary Tract: No adrenal gland nodules. Kidneys are symmetrical. No hydronephrosis or hydroureter. No stones. Bladder is decompressed with a Foley catheter. Stomach/Bowel: Stomach is unremarkable. Mildly dilated small bowel with decompression of the terminal ileum  suggesting early small bowel obstruction. Transition zone appears to be in the right lower quadrant. Colon is mostly decompressed. Diverticulosis of the sigmoid colon without evidence of diverticulitis. Colonic inflammatory changes seen previously appear to of resolved. Vascular/Lymphatic: Aortic atherosclerosis. No enlarged abdominal or pelvic lymph nodes. Reproductive: Uterus and ovaries are not enlarged. Vascular calcifications of the uterus and adnexal regions. Other: Small to moderate free fluid in the abdomen and pelvis. Mild subcutaneous edema. There appears to be a balloon in  the gluteal crease which may represent expelled rectal catheter. Fat in the left inguinal canal, possibly a lipoma. Musculoskeletal: Diffuse fatty atrophy of the musculature. Diffuse bone demineralization. Compression fractures of L1 and L3 appear chronic. Degenerative changes in the lumbar spine and hips. Sclerosis in the right superior femoral head may indicate avascular necrosis. IMPRESSION: 1. Suggestion of mild atelectasis in the lung bases with possible perihilar infiltrates without obvious consolidation. 2. Mildly dilated small bowel with decompression of the terminal ileum suggesting early small bowel obstruction. Transition zone appears to be in the right lower quadrant. 3. Small to moderate free fluid in the abdomen and pelvis. 4. There appears to be a balloon in the gluteal crease which may represent expelled rectal catheter. 5. Chronic pancreatitis. 6. Sclerosis in the right superior femoral head may indicate avascular necrosis. 7. Fat in the left inguinal canal, possibly a lipoma. 8. Aortic atherosclerosis. 9. Diffuse fatty atrophy of the musculature. 10. Old appearing compression fractures of T12, L1, and L3. Aortic Atherosclerosis (ICD10-I70.0). Electronically Signed   By: Burman Nieves M.D.   On: 09/07/2020 01:15   CT Chest Wo Contrast  Result Date: 08/26/2020 CLINICAL DATA:  64 year old female with chest pain and  shortness of breath. EXAM: CT CHEST, ABDOMEN AND PELVIS WITHOUT CONTRAST TECHNIQUE: Multidetector CT imaging of the chest, abdomen and pelvis was performed following the standard protocol without IV contrast. COMPARISON:  Chest radiograph dated 08/26/2020 and CT of the abdomen pelvis dated 08/15/2020. CT dated 03/24/2020. FINDINGS: CT CHEST FINDINGS Evaluation of this exam is limited in the absence of intravenous contrast as well as due to streak artifact caused by spinal hardware. Cardiovascular: Top-normal cardiac size. No pericardial effusion. There is atherosclerotic calcification of the thoracic aorta. No aneurysmal dilatation. The central pulmonary arteries are grossly unremarkable. Mediastinum/Nodes: No hilar or mediastinal adenopathy. There is a small hiatal hernia. The esophagus is grossly unremarkable. No mediastinal fluid collection. Lungs/Pleura: Small bilateral pleural effusions. There is diffuse interstitial streaky and bandlike densities with peribronchial thickening, new since the CT of 03/24/2020 and may represent edema, or pneumonia or combination. Clinical correlation is recommended. There is no pneumothorax. The central airways are patent. Musculoskeletal: Degenerative changes of the spine and several bilateral old healed rib fractures. Extensive posterior spinal fusion and prior T4-T5 corpectomy. No acute osseous pathology. CT ABDOMEN PELVIS FINDINGS No intra-abdominal free air or free fluid. Hepatobiliary: Cirrhosis. No intrahepatic biliary ductal dilatation. Cholecystectomy. Pancreas: Moderate pancreatic atrophy with scattered coarse calcification sequela of chronic pancreatitis. No active inflammatory changes. Spleen: Normal in size without focal abnormality. Adrenals/Urinary Tract: The adrenal glands unremarkable. The kidneys, visualized ureters, and urinary bladder appear unremarkable. Stomach/Bowel: There is a small hiatal hernia. There is sigmoid diverticulosis without active inflammatory  changes. Loose stool noted throughout the colon compatible with diarrheal state. Correlation with clinical exam and stool cultures recommended. There is no bowel obstruction. The appendix is not visualized with certainty. No inflammatory changes identified in the right lower quadrant. Vascular/Lymphatic: Advanced aortoiliac atherosclerotic disease. No portal venous gas. There is no adenopathy. Reproductive: The uterus is grossly unremarkable. No adnexal masses. Other: Diffuse subcutaneous soft tissue edema of the pelvis and buttock. No fluid collection. Musculoskeletal: Osteopenia with degenerative changes of the spine. No acute osseous pathology. 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 Old compression fracture of the L1 and L2 and L3. IMPRESSION: 1. Small bilateral pleural effusions with diffuse interstitial streaky and bandlike densities, new since the CT of 03/24/2020 and may represent edema,  or pneumonia or combination. Clinical correlation is recommended. 2. Diarrheal state. Correlation with clinical exam and stool cultures recommended. No bowel obstruction. 3. Sigmoid diverticulosis. 4. Cirrhosis. 5. Aortic Atherosclerosis (ICD10-I70.0). Electronically Signed   By: Elgie Collard M.D.   On: 08/26/2020 19:36   MR BRAIN WO CONTRAST  Result Date: 08/31/2020 CLINICAL DATA:  64 year old female code stroke presentation on 08/28/2020. Possible seizure. EXAM: MRI HEAD WITHOUT CONTRAST TECHNIQUE: Multiplanar, multiecho pulse sequences of the brain and surrounding structures were obtained without intravenous contrast. COMPARISON:  Brain MRI 02/12/2020.  Head CT 08/28/2020. FINDINGS: Brain: No restricted diffusion or evidence of acute infarction. On coronal imaging hippocampal formations remain within normal limits. Previous cortical based infarcts in both MCA territories, right MCA/PCA watershed (with associated hemosiderin and laminar necrosis). Numerous small chronic  infarcts in the bilateral cerebellum. Previous bilateral thalamic and basal ganglia lacunar infarcts. Patchy and confluent additional cerebral white matter T2 and FLAIR hyperintensity. No midline shift, mass effect, evidence of mass lesion, ventriculomegaly, extra-axial collection or acute intracranial hemorrhage. Cervicomedullary junction and pituitary are within normal limits. Vascular: Major intracranial vascular flow voids are stable since March. Skull and upper cervical spine: Stable visible cervical spine with multilevel disc degeneration and mild spondylolisthesis. Visualized bone marrow signal is within normal limits. Sinuses/Orbits: Negative orbits. Intubated. Fluid in the pharynx. Trace paranasal sinus fluid. Other: Mild bilateral mastoid fluid. Scalp and face appear negative. IMPRESSION: 1. No acute intracranial abnormality identified. 2. Very advanced chronic ischemic disease appears stable since March. Electronically Signed   By: Odessa Fleming M.D.   On: 08/31/2020 22:38   US RENAL  Result Date: 08/27/2020 CLINICAL DATA:  Renal failure. EXAM: RENAL / URINARY TRACT ULTRASOUND COMPLETE COMPARISON:  None. FINDINGS: Right Kidney: Renal measurements: 9.0 x 4.0 x 3.5 cm = volume: 68 mL. Echogenicity within normal limits. No mass or hydronephrosis visualized. Left Kidney: Renal measurements: 9.4 x 4.3 x 3.7 cm = volume: 80 mL. Echogenicity within normal limits. No mass or hydronephrosis visualized. Bladder: Not visualized as it appears to be completely decompressed. Other: None. IMPRESSION: Normal renal ultrasound. Electronically Signed   By: Lupita Raider M.D.   On: 08/27/2020 11:33   DG CHEST PORT 1 VIEW  Result Date: 09/04/2020 CLINICAL DATA:  Respirator mechanical failure. EXAM: PORTABLE CHEST 1 VIEW COMPARISON:  08/31/2020 chest radiograph and prior. FINDINGS: Right PICC tip overlies the deep right atrium. Endotracheal tube terminates approximately 1.2 cm above the carina. Enteric tube tip and side  port overlies the distal gastric body. Cardiomegaly. Diffuse interstitial prominence. No focal consolidation. Small bilateral pleural effusions, unchanged. No pneumothorax. Posterior spinal fixation hardware. IMPRESSION: Diffuse interstitial prominence and cardiomegaly, unchanged. Small bilateral pleural effusions, similar to prior exam. Electronically Signed   By: Stana Bunting M.D.   On: 09/04/2020 07:20   DG CHEST PORT 1 VIEW  Result Date: 08/31/2020 CLINICAL DATA:  Pulmonary infiltrate.  Intubated. EXAM: PORTABLE CHEST 1 VIEW COMPARISON:  08/29/2020 FINDINGS: The tips of the endotracheal tube and right PICC are obscured by spinal instrumentation. Enteric tube terminates over the stomach. The cardiac silhouette remains mildly enlarged. The interstitial markings are mildly prominent without overt edema. Streaky and patchy airspace opacities bilaterally on the prior study have improved. There are likely persistent small bilateral pleural effusions. No pneumothorax is identified. IMPRESSION: Improved lung aeration. Persistent small bilateral pleural effusions. Electronically Signed   By: Sebastian Ache M.D.   On: 08/31/2020 07:29   Portable Chest x-ray  Result Date: 08/29/2020 CLINICAL DATA:  64 year old female with ET placement. EXAM: PORTABLE CHEST 1 VIEW COMPARISON:  Chest radiograph dated 08/26/2020. FINDINGS: Right-sided PICC with tip over central SVC close to the cavoatrial junction. An endotracheal tube is noted. Evaluation for positioning of the tip is very limited due to superimposition of the thoracic hardware. The tip of the endotracheal tube projects over the thoracic cage, likely at or just above the carina. Bilateral pulmonary streaky densities have progressed since the prior radiograph. There are small bilateral pleural effusions. No pneumothorax. There is stable cardiomegaly. No acute osseous pathology. IMPRESSION: 1. Right-sided PICC with tip over central SVC close to the cavoatrial  junction. 2. Endotracheal tube with tip over the thoracic cage. 3. Progressive bilateral pulmonary densities since the prior radiograph. Electronically Signed   By: Elgie Collard M.D.   On: 08/29/2020 18:51   DG Chest Port 1 View  Result Date: 08/26/2020 CLINICAL DATA:  Sepsis EXAM: PORTABLE CHEST 1 VIEW COMPARISON:  08/15/2020 FINDINGS: Lungs are clear. No pneumothorax or pleural effusion. Cardiac size is within normal limits. Cervicothoracic fusion with instrumentation is again visualized, not well assessed on this exam. Multiple remote right rib fractures are again identified. Degenerative changes are noted within the shoulders bilaterally IMPRESSION: No active disease.  No interval change. Electronically Signed   By: Helyn Numbers MD   On: 08/26/2020 19:01   DG Chest Port 1 View  Result Date: 08/15/2020 CLINICAL DATA:  Sepsis. EXAM: PORTABLE CHEST 1 VIEW COMPARISON:  12/24/2019 FINDINGS: The cardiac silhouette, mediastinal and hilar contours are within normal limits and stable given the AP projection and portable technique. The lungs are clear of an acute process. No pleural effusions or pneumothorax. No pulmonary lesions. Stable thoracic fusion hardware and remote healed bilateral rib fractures. Stable advanced degenerative changes involving both shoulder joints. IMPRESSION: No acute cardiopulmonary findings. Electronically Signed   By: Rudie Meyer M.D.   On: 08/15/2020 14:43   EEG Monitoring Adult EMU (24 hour with video)  Result Date: 08/29/2020 Charlsie Quest, MD     08/30/2020  8:56 AM Patient Name: Marga Gramajo MRN: [id number] Epilepsy Attending: Charlsie Quest Referring Physician/Provider: Dr Juanetta Snow Date: 08/29/2020 Duration: 23.05 mins Patient history: 64yo F with h/o epilepsy presented with seizure like activity. EEg to evaluate for seizure. Level of alertness: Lethargic AEDs during EEG study: LEV, VPA, Propofol Technical aspects: This EEG study was done with scalp  electrodes positioned according to the 10-20 International system of electrode placement. Electrical activity was acquired at a sampling rate of 500Hz  and reviewed with a high frequency filter of 70Hz  and a low frequency filter of 1Hz . EEG data were recorded continuously and digitally stored. Description:   EEG showed continuous generalized 3 to 6 Hz theta-delta slowing. Generalized periodic epileptiform discharges with triphasic morphology at 1-1.5hz  were also noted intermittently.  Hyperventilation and photic stimulation were not performed.   ABNORMALITY -Continuous slow, generalized -Periodic epileptiform discharges with triphasic morphology, generalized IMPRESSION: This study showed evidence of generalized epileptogenicity as well as severe diffuse encephalopathy. The periodic epileptiform discharges with triphasic morphology are on the ictal-interictal continuum with low potential for seizures. Thee discharges can also be seen due to hyperammonemia, uremia, cefepime toxicity. No seizures were seen during this study. Charlsie Quest   ECHOCARDIOGRAM COMPLETE  Result Date: 08/27/2020    ECHOCARDIOGRAM REPORT   Patient Name:   RYNN MARKIEWICZ Date of Exam: 08/27/2020 Medical Rec #:  [id number]      Height:       60.0  in Accession #:    [id number]     Weight:       122.4 lb Date of Birth:  Aug 17, 1956      BSA:          1.515 m Patient Age:    64 years       BP:           78/45 mmHg Patient Gender: F              HR:           102 bpm. Exam Location:  Inpatient Procedure: 2D Echo, Cardiac Doppler and Color Doppler Indications:    [facility])  History:        Patient has prior history of Echocardiogram examinations, most                 recent 12/25/2019. CHF; Risk Factors:Hypertension and Diabetes.  Sonographer:    Eulah Pont RDCS Referring Phys: [id number] BRADLEY L ICARD IMPRESSIONS  1. Left ventricular ejection fraction, by estimation, is 60 to 65%. The left ventricle has normal function. The left ventricle has  no regional wall motion abnormalities. Left ventricular diastolic parameters are indeterminate.  2. Right ventricular systolic function is normal. The right ventricular size is normal.  3. The mitral valve is normal in structure. No evidence of mitral valve regurgitation. No evidence of mitral stenosis.  4. Tricuspid valve regurgitation is moderate.  5. The aortic valve is normal in structure. Aortic valve regurgitation is not visualized. No aortic stenosis is present. FINDINGS  Left Ventricle: Left ventricular ejection fraction, by estimation, is 60 to 65%. The left ventricle has normal function. The left ventricle has no regional wall motion abnormalities. The left ventricular internal cavity size was normal in size. There is  no left ventricular hypertrophy. Left ventricular diastolic parameters are indeterminate. Right Ventricle: The right ventricular size is normal. No increase in right ventricular wall thickness. Right ventricular systolic function is normal. Left Atrium: Left atrial size was normal in size. Right Atrium: Right atrial size was normal in size. Pericardium: There is no evidence of pericardial effusion. Mitral Valve: The mitral valve is normal in structure. No evidence of mitral valve regurgitation. No evidence of mitral valve stenosis. Tricuspid Valve: The tricuspid valve is grossly normal. Tricuspid valve regurgitation is moderate . No evidence of tricuspid stenosis. Aortic Valve: The aortic valve is normal in structure. Aortic valve regurgitation is not visualized. No aortic stenosis is present. Pulmonic Valve: The pulmonic valve was normal in structure. Pulmonic valve regurgitation is not visualized. No evidence of pulmonic stenosis. Aorta: The aortic root and ascending aorta are structurally normal, with no evidence of dilitation. IAS/Shunts: The atrial septum is grossly normal.  LEFT VENTRICLE PLAX 2D LVIDd:         4.20 cm  Diastology LVIDs:         2.50 cm  LV e' medial:    5.70 cm/s LV  PW:         0.80 cm  LV E/e' medial:  12.3 LV IVS:        0.80 cm  LV e' lateral:   10.60 cm/s LVOT diam:     1.70 cm  LV E/e' lateral: 6.6 LV SV:         48 LV SV Index:   32 LVOT Area:     2.27 cm  RIGHT VENTRICLE RV S prime:     12.90 cm/s TAPSE (M-mode): 1.2 cm LEFT ATRIUM  Index       RIGHT ATRIUM           Index LA diam:      3.20 cm 2.11 cm/m  RA Area:     15.10 cm LA Vol (A2C): 24.2 ml 15.98 ml/m RA Volume:   35.60 ml  23.50 ml/m LA Vol (A4C): 31.0 ml 20.46 ml/m  AORTIC VALVE LVOT Vmax:   103.00 cm/s LVOT Vmean:  63.700 cm/s LVOT VTI:    0.212 m  AORTA Ao Root diam: 2.60 cm MITRAL VALVE               TRICUSPID VALVE MV Area (PHT): 3.03 cm    TR Peak grad:   28.7 mmHg MV Decel Time: 250 msec    TR Vmax:        268.00 cm/s MV E velocity: 70.20 cm/s MV A velocity: 75.20 cm/s  SHUNTS MV E/A ratio:  0.93        Systemic VTI:  0.21 m                            Systemic Diam: 1.70 cm Kristeen Miss MD Electronically signed by Kristeen Miss MD Signature Date/Time: 08/27/2020/2:20:59 PM    Final    CT HEAD CODE STROKE WO CONTRAST  Result Date: 08/28/2020 CLINICAL DATA:  Code stroke. Initial evaluation for acute neuro deficit, possible seizure activity. EXAM: CT HEAD WITHOUT CONTRAST TECHNIQUE: Contiguous axial images were obtained from the base of the skull through the vertex without intravenous contrast. COMPARISON:  Prior CT from 08/26/2020. FINDINGS: Brain: Advanced age-related cerebral atrophy. 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 Remote lacunar infarcts noted involving the bilateral thalami and left basal ganglia. Appearance is relatively stable. No acute intracranial hemorrhage. No acute large vessel territory infarct. No mass lesion or midline shift. No hydrocephalus or extra-axial fluid collection. Vascular: No hyperdense vessel. Calcified atherosclerosis present at skull base. Skull: Scalp soft tissues  and calvarium within normal limits. Sinuses/Orbits: Globes and orbital soft tissues demonstrate no acute finding. Paranasal sinuses are clear. No mastoid effusion. Other: None. ASPECTS [facility] Stroke Program Early CT Score) - Ganglionic level infarction (caudate, lentiform nuclei, internal capsule, insula, M1-M3 cortex): 7 - Supraganglionic infarction (M4-M6 cortex): 3 Total score (0-10 with 10 being normal): 10 IMPRESSION: 1. Stable head CT.  No acute intracranial abnormality identified 2. ASPECTS is 10. 3. Advanced cerebral atrophy with chronic microvascular ischemic disease, with multiple remote ischemic infarcts as above. Critical Value/emergent results were called by telephone at the time of interpretation on 08/28/2020 at 6:44 am to provider Juanetta Snow , who verbally acknowledged these results. Electronically Signed   By: Rise Mu M.D.   On: 08/28/2020 06:47   VAS [place] LOWER EXTREMITY VENOUS (DVT)  Result Date: 09/08/2020  Lower Venous DVTStudy Indications: Edema, Erythema, and d-dimer.  Limitations: Poor ultrasound/tissue interface and patient intolerant to probe pressure, unable to reposition, and guarding. Comparison       12-26-2019 Prior bilateral lower extremity venous study Study:           available. Performing Technologist: Jean Rosenthal  Examination Guidelines: A complete evaluation includes B-mode imaging, spectral Doppler, color Doppler, and power Doppler as needed of all accessible portions of each vessel. Bilateral testing is considered an integral part of a complete examination. Limited examinations for reoccurring indications may be performed as noted. The reflux portion of the exam  is performed with the patient in reverse Trendelenburg.  +---------+---------------+---------+-----------+----------+---------------+ RIGHT    CompressibilityPhasicitySpontaneityPropertiesThrombus Aging  +---------+---------------+---------+-----------+----------+---------------+ CFV       Full           Yes      Yes                                  +---------+---------------+---------+-----------+----------+---------------+ SFJ      Full                                                         +---------+---------------+---------+-----------+----------+---------------+ FV Prox                                               Not visualized  +---------+---------------+---------+-----------+----------+---------------+ FV Mid   Full                                                         +---------+---------------+---------+-----------+----------+---------------+ FV DistalFull                                                         +---------+---------------+---------+-----------+----------+---------------+ PFV      Full                                                         +---------+---------------+---------+-----------+----------+---------------+ POP      Full           Yes      Yes                                  +---------+---------------+---------+-----------+----------+---------------+ PTV      Full                                                         +---------+---------------+---------+-----------+----------+---------------+ PERO                                                  Patent by color +---------+---------------+---------+-----------+----------+---------------+   +---------+---------------+---------+-----------+----------+--------------+ LEFT     CompressibilityPhasicitySpontaneityPropertiesThrombus Aging +---------+---------------+---------+-----------+----------+--------------+ CFV      Full           Yes      Yes                                 +---------+---------------+---------+-----------+----------+--------------+  SFJ      Full                                                        +---------+---------------+---------+-----------+----------+--------------+ FV Prox  Full                                                         +---------+---------------+---------+-----------+----------+--------------+ FV Mid   Full                                                        +---------+---------------+---------+-----------+----------+--------------+ FV DistalFull                                                        +---------+---------------+---------+-----------+----------+--------------+ PFV      Full                                                        +---------+---------------+---------+-----------+----------+--------------+ POP                                                   Not visualized +---------+---------------+---------+-----------+----------+--------------+ PTV                                                   Not visualized +---------+---------------+---------+-----------+----------+--------------+ PERO                                                  Not visualized +---------+---------------+---------+-----------+----------+--------------+     Summary: RIGHT: - There is no evidence of deep vein thrombosis in the lower extremity. However, portions of this examination were limited- see technologist comments above.  - No cystic structure found in the popliteal fossa.  LEFT: - There is no evidence of deep vein thrombosis in the lower extremity. However, portions of this examination were limited- see technologist comments above.  - No cystic structure found in the popliteal fossa.  *See table(s) above for measurements and observations. Electronically signed by Coral Else MD on 09/08/2020 at 7:38:56 PM.    Final    [place] EKG SITE RITE  Result Date: 08/27/2020 If Site Rite image not attached, placement could not be confirmed due to current cardiac rhythm.   Subjective:  No complaints feels  great. Discharge Exam: Vitals:   09/10/20 0458 09/10/20 0750  BP: (!) 134/97   Pulse: 96   Resp: 18   Temp: 98.3 F (36.8 C) 97.9 F (36.6 C)  SpO2:  100% 100%   Vitals:   09/09/20 2045 09/10/20 0025 09/10/20 0458 09/10/20 0750  BP: (!) 123/94 (!) 135/93 (!) 134/97   Pulse:   96   Resp: 18 13 18    Temp: 97.6 F (36.4 C) 98.7 F (37.1 C) 98.3 F (36.8 C) 97.9 F (36.6 C)  TempSrc: Oral Oral Oral Oral  SpO2: 100% 100% 100% 100%  Weight:   53.1 kg   Height:        General: Pt is alert, awake, not in acute distress Cardiovascular: RRR, S1/S2 +, no rubs, no gallops Respiratory: CTA bilaterally, no wheezing, no rhonchi Abdominal: Soft, NT, ND, bowel sounds + Extremities: no edema, no cyanosis    The results of significant diagnostics from this hospitalization (including imaging, microbiology, ancillary and laboratory) are listed below for reference.     Microbiology: Recent Results (from the past 240 hour(s))  Culture, Urine     Status: None   Collection Time: 09/07/20 12:02 AM   Specimen: Urine, Random  Result Value Ref Range Status   Specimen Description URINE, RANDOM  Final   Special Requests NONE  Final   Culture   Final    NO GROWTH Performed at [facility], [address] [place], [place] [address]    Report Status 09/08/2020 FINAL  Final  Culture, blood (routine x 2)     Status: None (Preliminary result)   Collection Time: 09/07/20  5:51 AM   Specimen: BLOOD LEFT HAND  Result Value Ref Range Status   Specimen Description BLOOD LEFT HAND  Final   Special Requests   Final    BOTTLES DRAWN AEROBIC AND ANAEROBIC Blood Culture adequate volume   Culture   Final    NO GROWTH 2 DAYS Performed at [facility], [address], [place], [place] [address]    Report Status PENDING  Incomplete  Culture, blood (routine x 2)     Status: None (Preliminary result)   Collection Time: 09/07/20  6:02 AM   Specimen: BLOOD LEFT FOREARM  Result Value Ref Range Status   Specimen Description BLOOD LEFT FOREARM  Final   Special Requests   Final    BOTTLES DRAWN AEROBIC ONLY Blood Culture results may not be optimal  due to an inadequate volume of blood received in culture bottles   Culture   Final    NO GROWTH 2 DAYS Performed at [facility], [address] [place], [place] [address]    Report Status PENDING  Incomplete     Labs: BNP (last 3 results) No results for input(s): BNP in the last 8760 hours. Basic Metabolic Panel: Recent Labs  Lab 09/04/20 0507 09/04/20 0507 09/05/20 0530 09/05/20 0530 09/06/20 0500 09/06/20 2159 09/08/20 0500 09/09/20 0620 09/10/20 0435  NA 145   < > 144   < > 142 138 137 136 134*  K 3.9   < > 3.3*   < > 3.7 3.2* 5.5* 4.5 4.3  CL 112*   < > 108   < > 109 110 107 107 105  CO2 24   < > 27   < > 25 22 24  19* 22  GLUCOSE 145*   < > 87   < > 129* 109* 92 109* 84  BUN  25*   < > 20   < > 16 10 11 9 8   CREATININE 0.87   < > 0.73   < > 0.69 0.54 0.90 0.92 0.75  CALCIUM 8.4*   < > 8.1*   < > 7.8* 6.5* 8.3* 8.1* 8.5*  MG 1.8  --  1.5*  --  2.2 1.5*  --   --   --    < > = values in this interval not displayed.   Liver Function Tests: Recent Labs  Lab 09/04/20 0507 09/05/20 0530 09/08/20 0500  AST 162* 162* 83*  ALT 61* 64* 42  ALKPHOS 652* 600* 434*  BILITOT 6.1* 8.1* 6.5*  PROT 5.2* 5.3* 5.4*  ALBUMIN 2.1* 2.0* 1.8*   No results for input(s): LIPASE, AMYLASE in the last 168 hours. Recent Labs  Lab 09/04/20 0507  AMMONIA 94*   CBC: Recent Labs  Lab 09/04/20 0507 09/04/20 0507 09/05/20 0530 09/07/20 0034 09/08/20 0700 09/08/20 2030 09/09/20 0620  WBC 10.2  --  9.7 7.0 10.0  --  9.6  HGB 9.0*   < > 9.8* 7.4* 6.5* 8.0* 8.0*  HCT 26.4*   < > 28.0* 22.1* 19.4* 24.0* 24.1*  MCV 77.6*  --  76.9* 79.5* 79.2*  --  81.4  PLT 56*  --  75* 139* 171  --  169   < > = values in this interval not displayed.   Cardiac Enzymes: No results for input(s): CKTOTAL, CKMB, CKMBINDEX, TROPONINI in the last 168 hours. BNP: Invalid input(s): POCBNP CBG: Recent Labs  Lab 09/09/20 1613 09/09/20 1957 09/10/20 0026 09/10/20 0416 09/10/20 0750  GLUCAP  122* 79 93 71 107*   D-Dimer Recent Labs    09/08/20 1000  DDIMER 3.90*   Hgb A1c No results for input(s): HGBA1C in the last 72 hours. Lipid Profile No results for input(s): CHOL, HDL, LDLCALC, TRIG, CHOLHDL, LDLDIRECT in the last 72 hours. Thyroid function studies No results for input(s): TSH, T4TOTAL, T3FREE, THYROIDAB in the last 72 hours.  Invalid input(s): FREET3 Anemia work up Recent Labs    09/09/20 0620  FERRITIN 1,604*  TIBC NOT CALCULATED  IRON 61   Urinalysis    Component Value Date/Time   COLORURINE AMBER (A) 09/06/2020 2350   APPEARANCEUR HAZY (A) 09/06/2020 2350   LABSPEC 1.014 09/06/2020 2350   PHURINE 7.0 09/06/2020 2350   GLUCOSEU NEGATIVE 09/06/2020 2350   HGBUR SMALL (A) 09/06/2020 2350   BILIRUBINUR NEGATIVE 09/06/2020 2350   KETONESUR NEGATIVE 09/06/2020 2350   PROTEINUR 30 (A) 09/06/2020 2350   UROBILINOGEN 1.0 07/27/2015 1052   NITRITE NEGATIVE 09/06/2020 2350   LEUKOCYTESUR NEGATIVE 09/06/2020 2350   Sepsis Labs Invalid input(s): PROCALCITONIN,  WBC,  LACTICIDVEN Microbiology Recent Results (from the past 240 hour(s))  Culture, Urine     Status: None   Collection Time: 09/07/20 12:02 AM   Specimen: Urine, Random  Result Value Ref Range Status   Specimen Description URINE, RANDOM  Final   Special Requests NONE  Final   Culture   Final    NO GROWTH Performed at [facility], [address] [place], [place]    Report Status 09/08/2020 FINAL  Final  Culture, blood (routine x 2)     Status: None (Preliminary result)   Collection Time: 09/07/20  5:51 AM   Specimen: BLOOD LEFT HAND  Result Value Ref Range Status   Specimen Description BLOOD LEFT HAND  Final   Special Requests   Final  BOTTLES DRAWN AEROBIC AND ANAEROBIC Blood Culture adequate volume   Culture   Final    NO GROWTH 2 DAYS Performed at [facility], [address] [place], [place] [address]    Report Status PENDING  Incomplete  Culture, blood  (routine x 2)     Status: None (Preliminary result)   Collection Time: 09/07/20  6:02 AM   Specimen: BLOOD LEFT FOREARM  Result Value Ref Range Status   Specimen Description BLOOD LEFT FOREARM  Final   Special Requests   Final    BOTTLES DRAWN AEROBIC ONLY Blood Culture results may not be optimal due to an inadequate volume of blood received in culture bottles   Culture   Final    NO GROWTH 2 DAYS Performed at [facility], [address], [place], [place] [address]    Report Status PENDING  Incomplete     Time coordinating discharge: Over 30 minutes  SIGNED:   Marinda Elk, MD  [facility] 09/10/2020, 8:17 AM Pager   If 7PM-7AM, please contact night-coverage [email or web address] Password [email or web address]

## 2020-09-12 LAB — CULTURE, BLOOD (ROUTINE X 2)
Culture: NO GROWTH
Culture: NO GROWTH
Special Requests: ADEQUATE

## 2020-09-22 NOTE — TOC Progression Note (Signed)
Transition of Care [facility]) - Progression Note    Patient Details  Name: Amber [name] MRN: [id number] Date of Birth: 1956-08-27  Transition of Care [facility]) CM/SW Contact  Leone Haven, RN Phone Number: 09/22/2020, 1:22 PM  Clinical Narrative:    NCM received call from Antoinne stating they have not heard from [facility] or seen anyone,  Antoinne stated that the patient's phone was not working for a couple days so the may have been the issue, this NCM called Kandee Keen with [facility] to inform him he states he will look into.  NCM gave Marchia Meiers phone number [phone].    Expected Discharge Plan: Home w Home Health Services Barriers to Discharge: No Barriers Identified  Expected Discharge Plan and Services Expected Discharge Plan: Home w Home Health Services   Discharge Planning Services: CM Consult Post Acute Care Choice: Home Health Living arrangements for the past 2 months: Single Family Home Expected Discharge Date: 09/10/20                 DME Agency: NA       HH Arranged: PT, OT HH Agency: [facility] Date [facility] Agency Contacted: 09/10/20 Time HH Agency Contacted: [phone] Representative spoke with at [facility] Agency: Kandee Keen   Social Determinants of Health (SDOH) Interventions Food Insecurity Interventions: Intervention Not Indicated Transportation Interventions: Intervention Not Indicated  Readmission Risk Interventions Readmission Risk Prevention Plan 09/10/2020  Transportation Screening Complete  Medication Review [employer]) Complete  PCP or Specialist appointment within 3-5 days of discharge Complete  HRI or Home Care Consult Complete  SW Recovery Care/Counseling Consult Complete  Palliative Care Screening Not Applicable  Skilled Nursing Facility Complete  Some recent data might be hidden

## 2020-10-01 IMAGING — CT CT ABD-PELV W/O CM
2 of 4 series · 16 of 46 positions shown, 18 images · non-contrast
Comparison: 03/24/2020

CLINICAL DATA: Abdominal pain, fever and generalized body aches.
Diarrhea.

EXAM:
CT ABDOMEN AND PELVIS WITHOUT CONTRAST
TECHNIQUE: Multidetector CT imaging of the abdomen and pelvis was performed
following the standard protocol without IV contrast.

[Series 4: a/p w/o 5mm · axial · non-contrast · 0.74mm/px · z∈[-810,-425]mm · 13 of 85 slices shown, 15 images]
[im 4/85  soft-tissue]
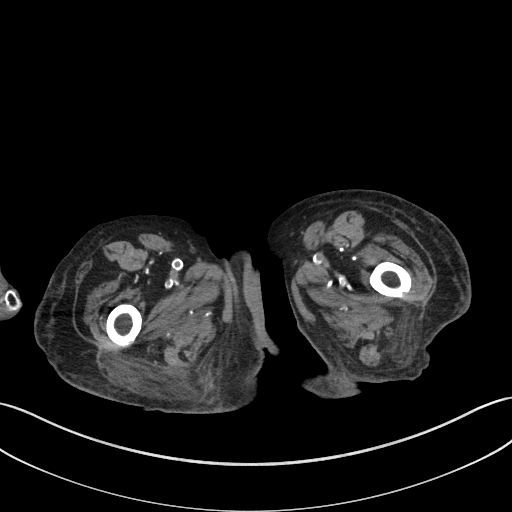
[im 4/85  bone]
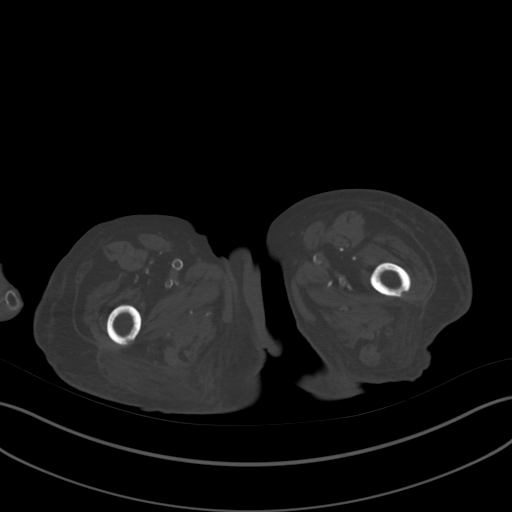
[im 10/85  soft-tissue]
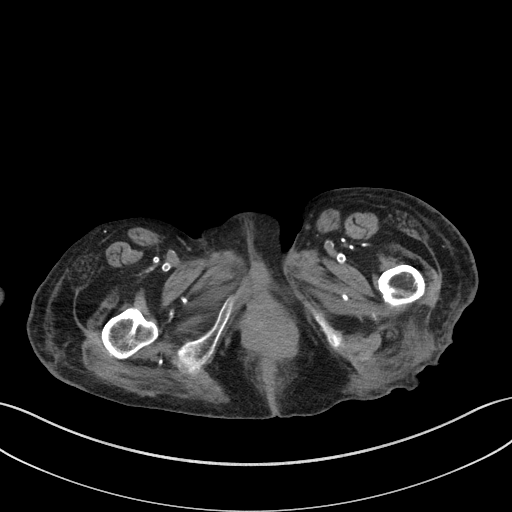
[im 17/85  soft-tissue]
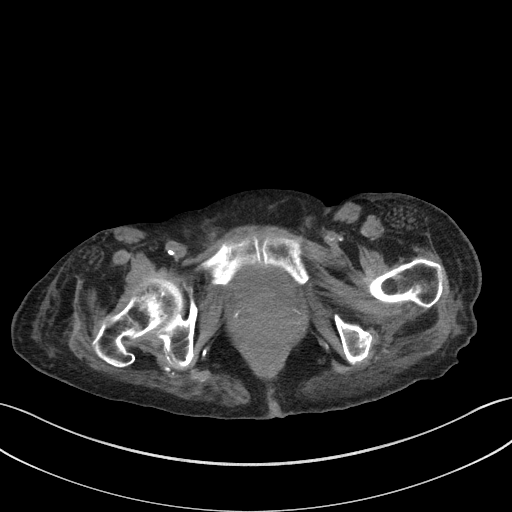
[im 23/85  soft-tissue]
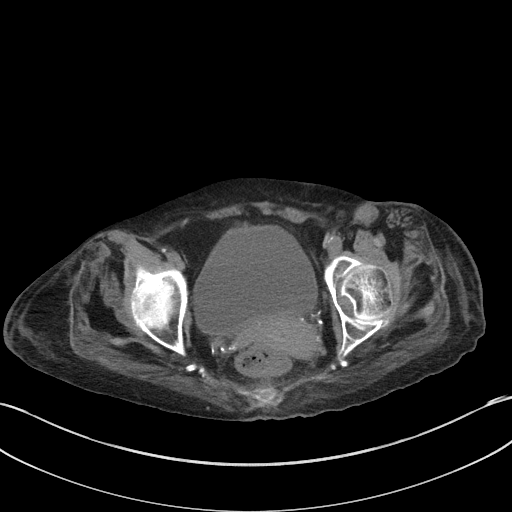
[im 30/85  soft-tissue]
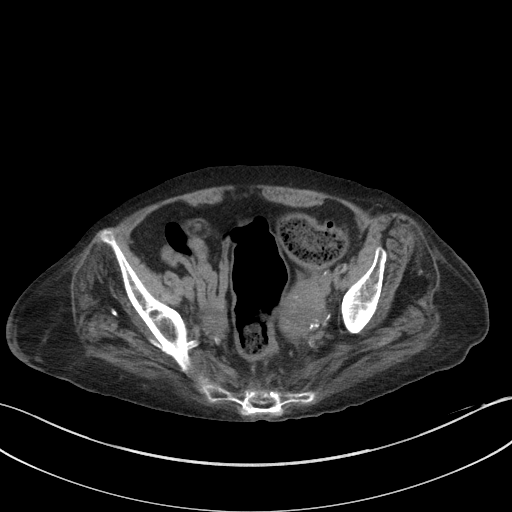
[im 36/85  soft-tissue]
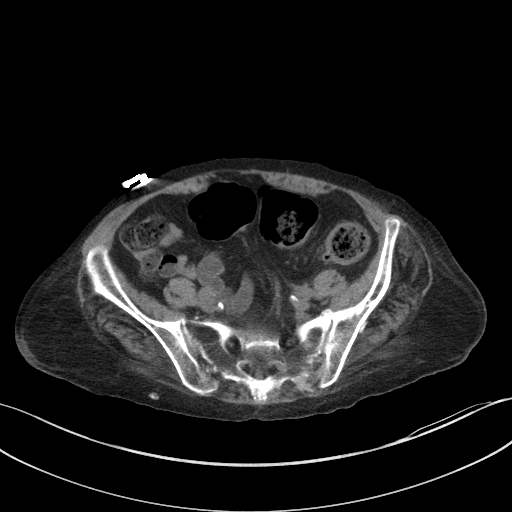
[im 43/85  soft-tissue]
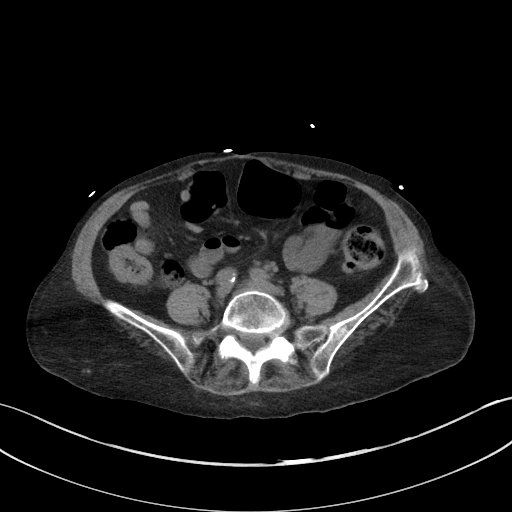
[im 49/85  soft-tissue]
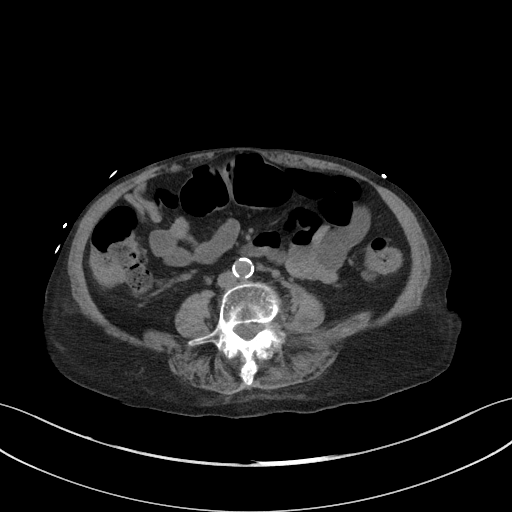
[im 55/85  soft-tissue]
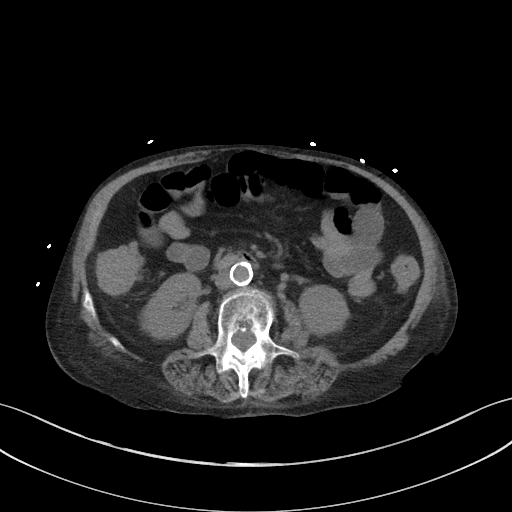
[im 55/85  bone]
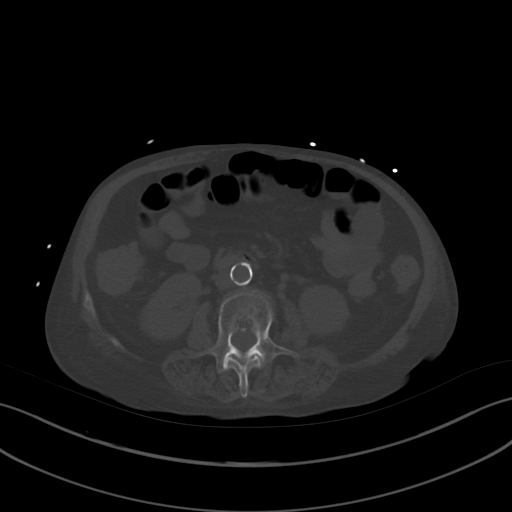
[im 62/85  soft-tissue]
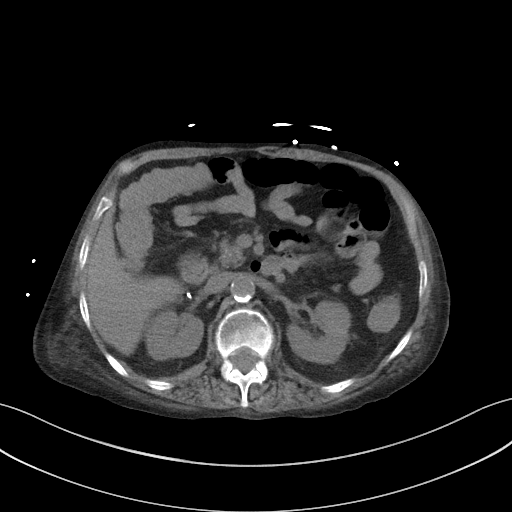
[im 68/85  soft-tissue]
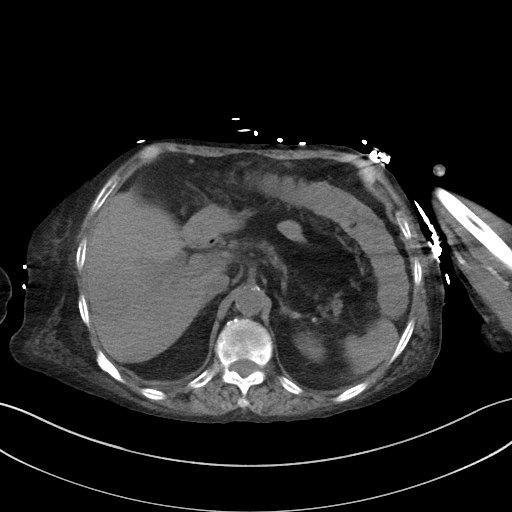
[im 75/85  soft-tissue]
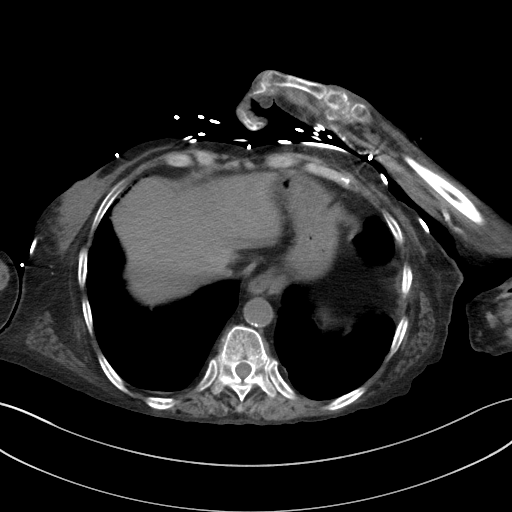
[im 81/85  soft-tissue]
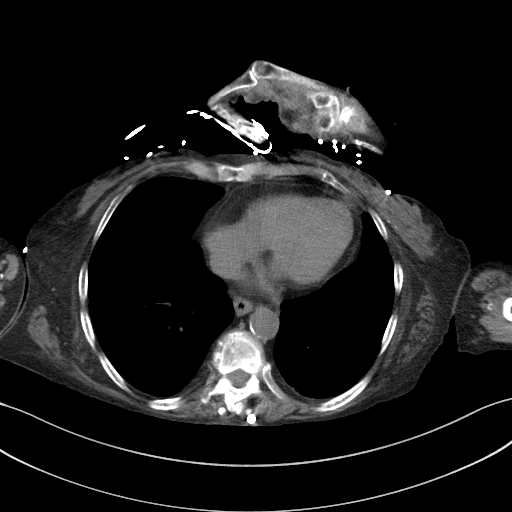

[Series 6: a/p w/o cor · coronal · non-contrast · 0.82mm/px · 3 of 145 slices shown]
[im 49/145  soft-tissue]
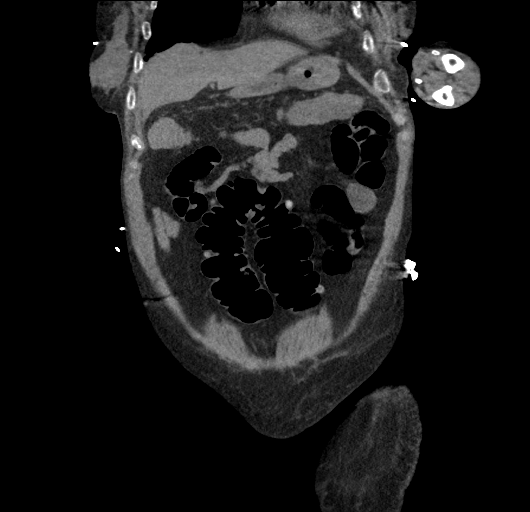
[im 65/145  soft-tissue]
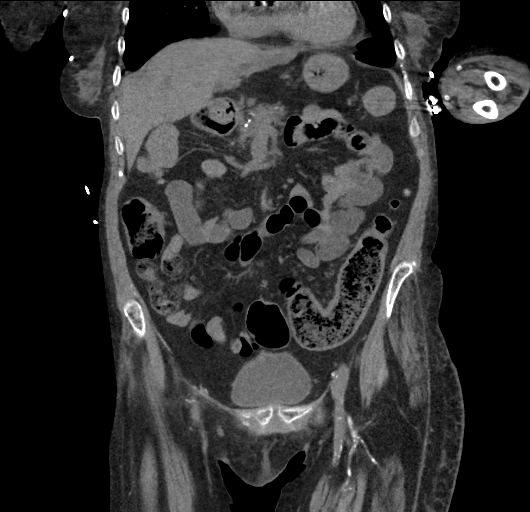
[im 81/145  soft-tissue]
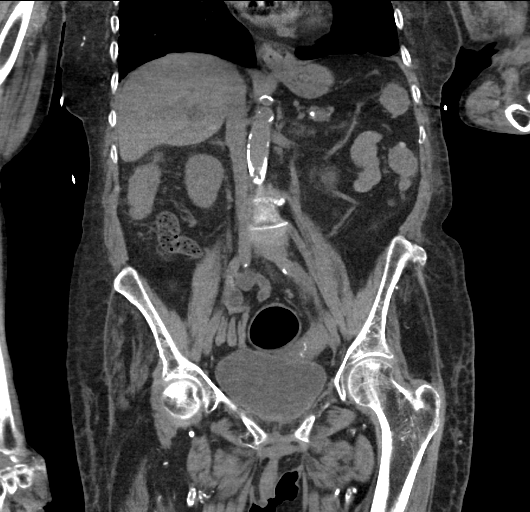

[16 of 46 positions shown; findings below may reference images not displayed]

FINDINGS: Lower chest: The lung bases are clear of an acute process. Chronic
lung changes are noted. The heart is normal in size. Prominent
pericardial fat. The distal esophagus is grossly normal.

Hepatobiliary: No hepatic lesions are identified without contrast.
The gallbladder is surgically absent. No biliary dilatation.

Pancreas: Stable advanced pancreatic atrophy and diffuse
calcifications consistent with chronic calcific pancreatitis. No
acute inflammatory changes.

Spleen: Normal size.  No focal lesions.

Adrenals/Urinary Tract: The adrenal glands and kidneys are
unremarkable. No renal lesions, hydronephrosis or obstructing
ureteral calculi. The bladder is unremarkable.

Stomach/Bowel: The stomach, duodenum and small bowel are
unremarkable. No acute inflammatory changes, mass lesions or
obstructive findings. Suspect inflammation of the ascending,
transverse and descending colon suggesting an inflammatory or
infectious colitis. Scattered colonic diverticuli but no evidence of
acute diverticulitis. The sigmoid colon and rectum are grossly
normal.

Vascular/Lymphatic: Advanced vascular calcifications but no
aneurysm. No mesenteric or retroperitoneal mass or adenopathy.

Reproductive: The uterus and ovaries are stable. Extensive
calcifications are noted.

Other: No pelvic mass or adenopathy. No free pelvic fluid
collections. No inguinal mass or adenopathy. No abdominal wall
hernia or subcutaneous lesions.

Musculoskeletal: Advanced fatty atrophy of the hip and pelvic
musculature. No acute bony findings. Osteoporosis and remote
fractures involving T12, L1 and L3.
IMPRESSION: 1. Suspect an inflammatory or infectious colitis.
2. No other significant abdominal/pelvic findings, mass lesions or
adenopathy.
3. Stable advanced pancreatic atrophy and diffuse calcifications
consistent with chronic calcific pancreatitis.
4. Status post cholecystectomy. No biliary dilatation.
5. Aortic atherosclerosis.

Aortic Atherosclerosis ([id number]-W9W.W).

## 2020-10-01 IMAGING — DX DG CHEST 1V PORT
1 series · 1 of 1 positions shown · non-contrast
Comparison: 12/24/2019

CLINICAL DATA: Sepsis.

EXAM:
PORTABLE CHEST 1 VIEW

[chest]
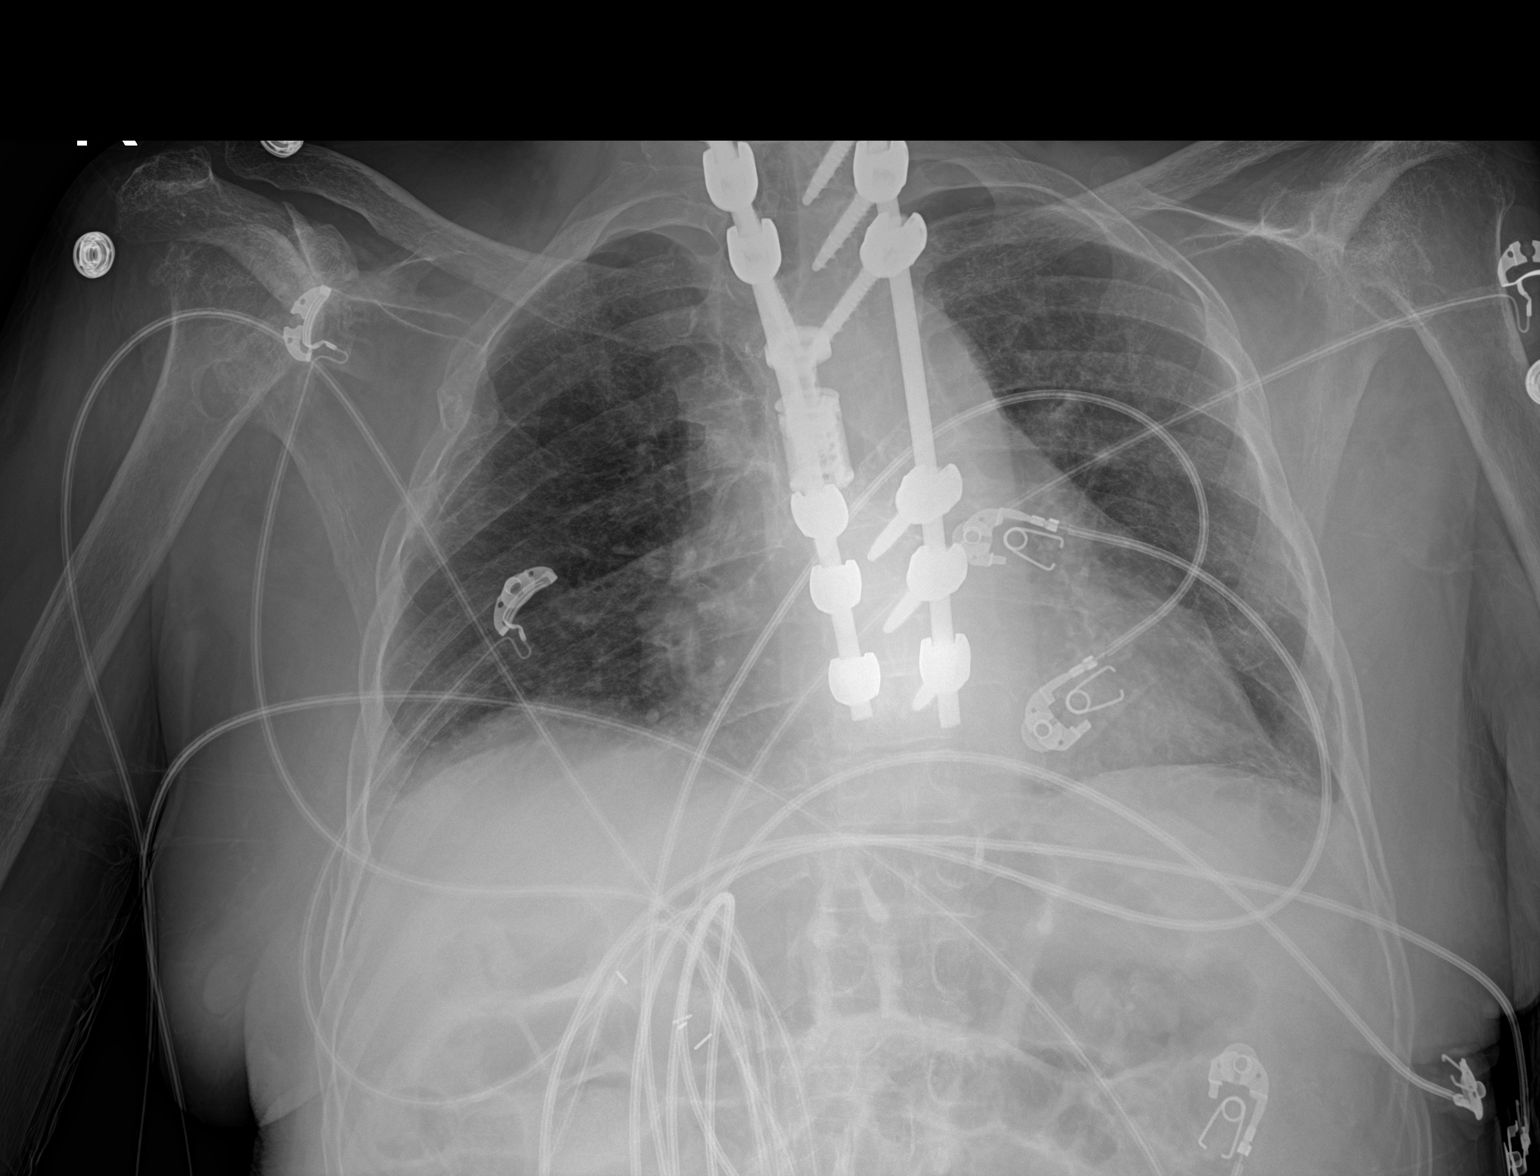

[1 of 1 positions shown; findings below may reference images not displayed]

FINDINGS: The cardiac silhouette, mediastinal and hilar contours are within
normal limits and stable given the AP projection and portable
technique.

The lungs are clear of an acute process. No pleural effusions or
pneumothorax. No pulmonary lesions.

Stable thoracic fusion hardware and remote healed bilateral rib
fractures. Stable advanced degenerative changes involving both
shoulder joints.
IMPRESSION: No acute cardiopulmonary findings.

## 2020-10-12 IMAGING — CT CT HEAD W/O CM
3 series · 16 of 47 positions shown, 19 images · non-contrast
Comparison: 02/12/2020

CLINICAL DATA: Altered mental status.

EXAM:
CT HEAD WITHOUT CONTRAST
TECHNIQUE: Contiguous axial images were obtained from the base of the skull
through the vertex without intravenous contrast.

[Series 2: head wo · axial · 0.41mm/px · z∈[-197,-72]mm · 10 of 30 slices shown, 13 images]
[im 3/30  brain]
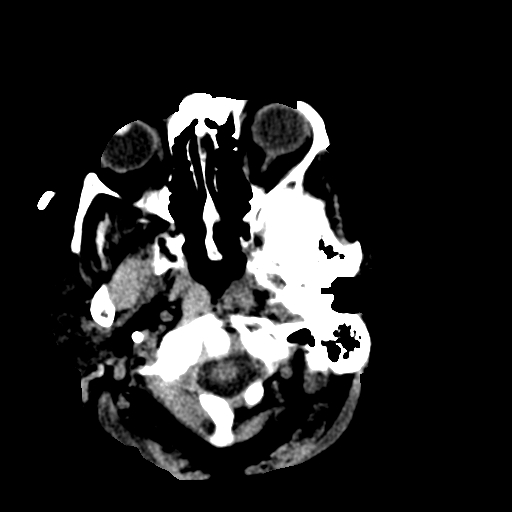
[im 3/30  bone]
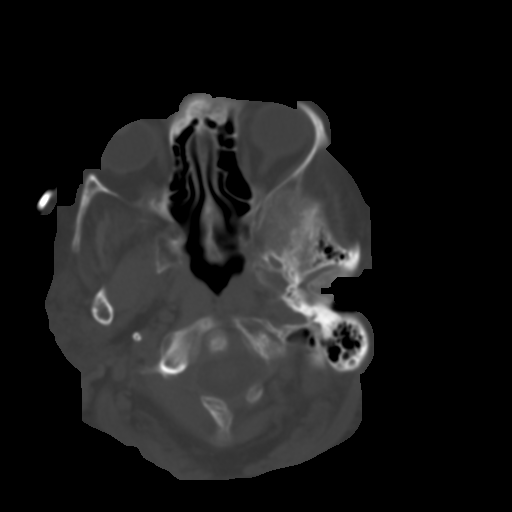
[im 6/30  brain]
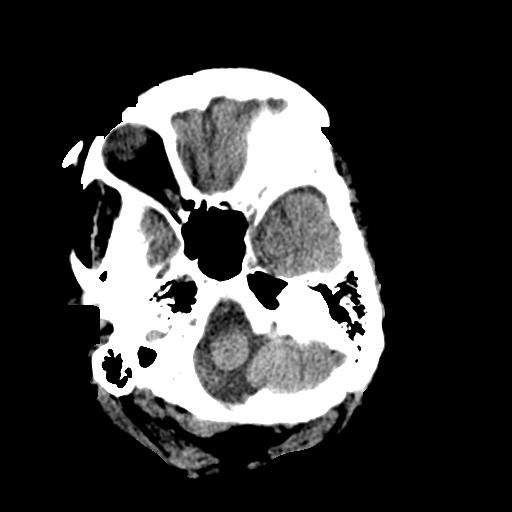
[im 9/30  brain]
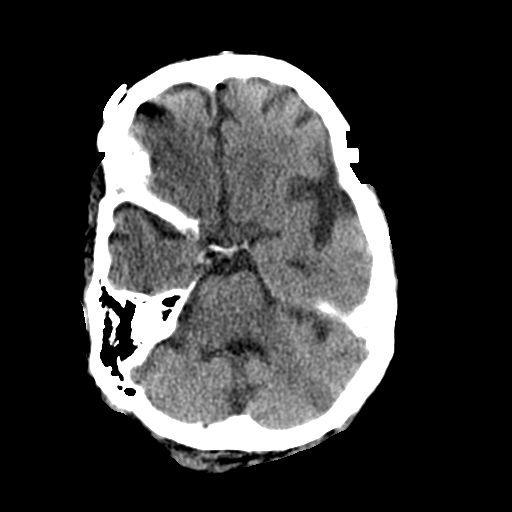
[im 11/30  brain]
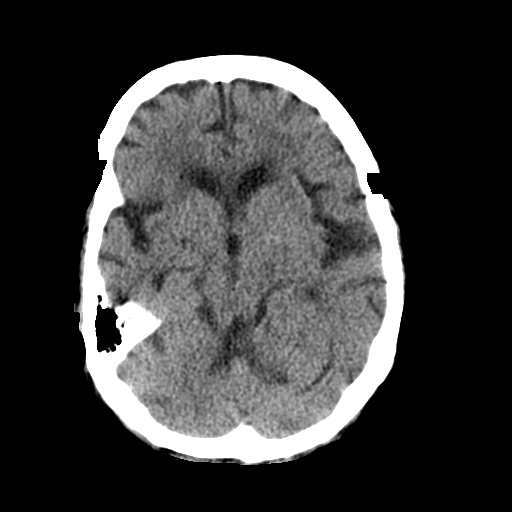
[im 14/30  brain]
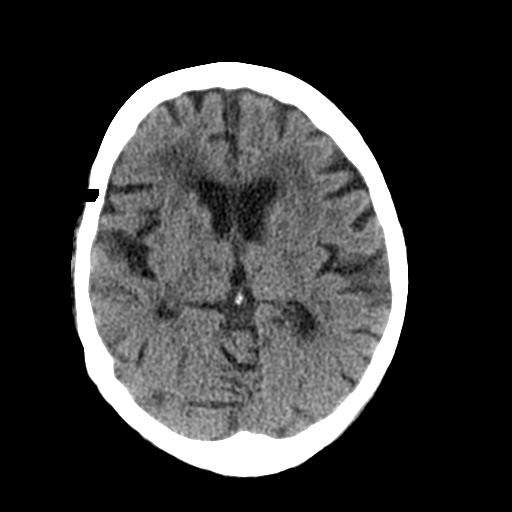
[im 14/30  bone]
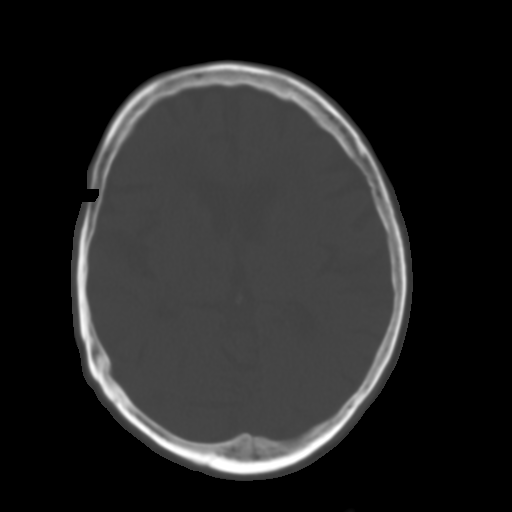
[im 17/30  brain]
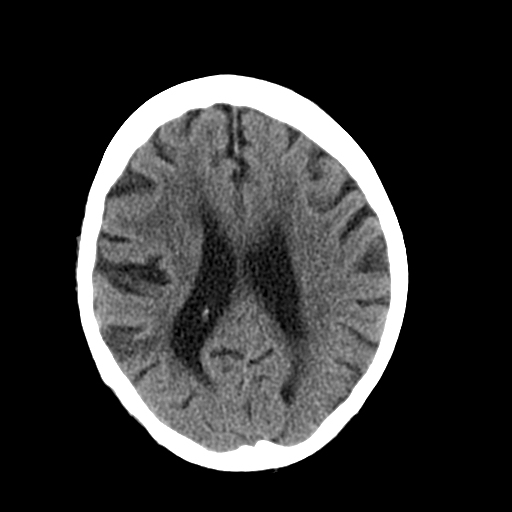
[im 20/30  brain]
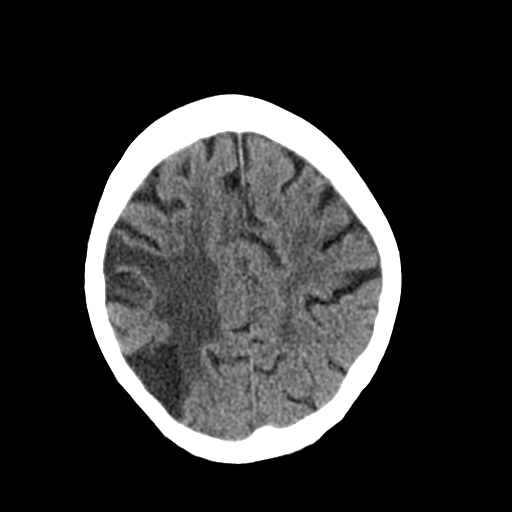
[im 23/30  brain]
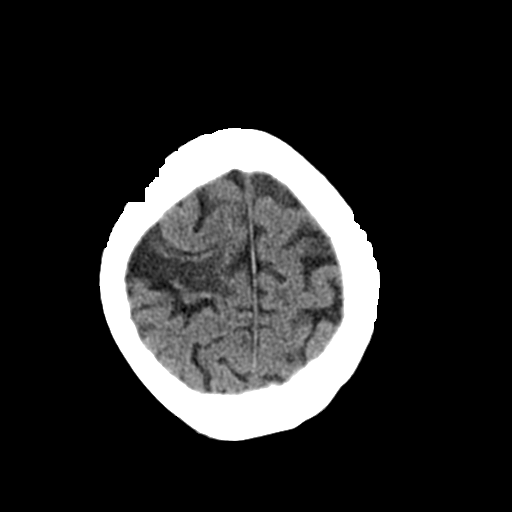
[im 25/30  brain]
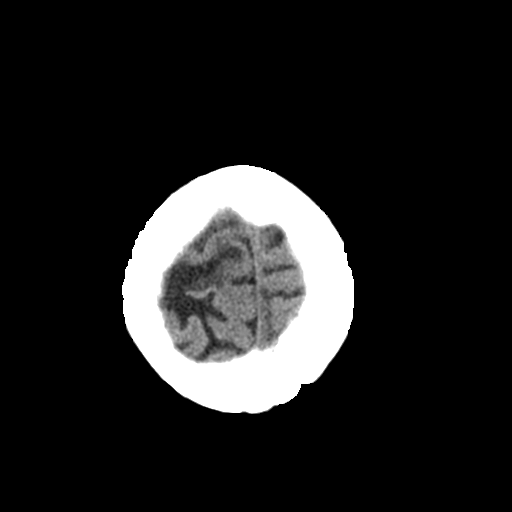
[im 25/30  bone]
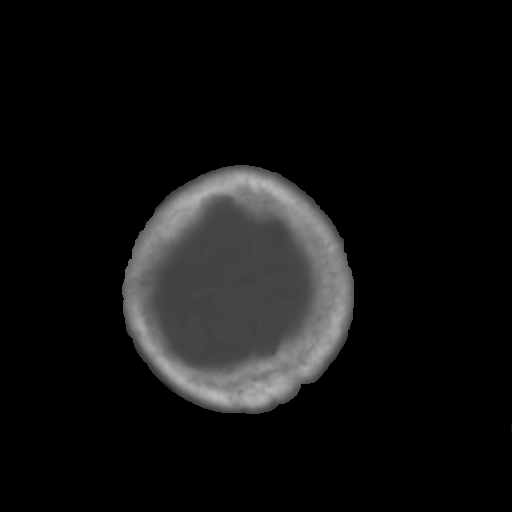
[im 28/30  brain]
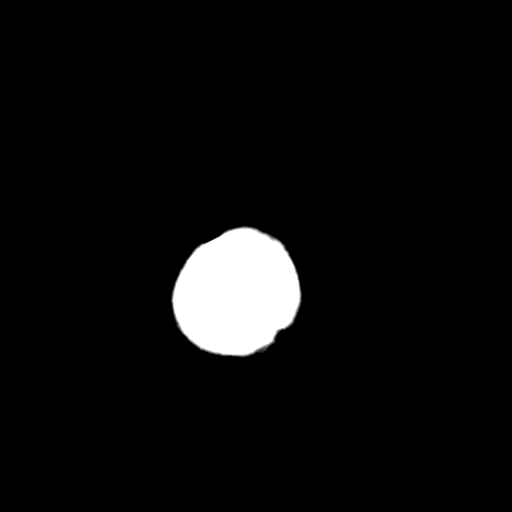

[Series 5: coronal soft tissue · coronal · 0.29mm/px · 3 of 61 slices shown]
[im 21/61  brain]
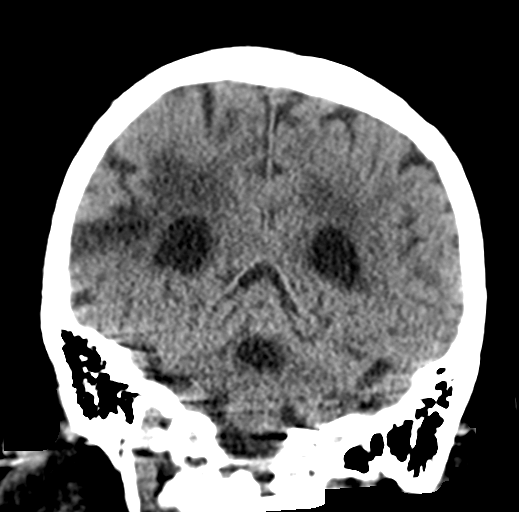
[im 27/61  brain]
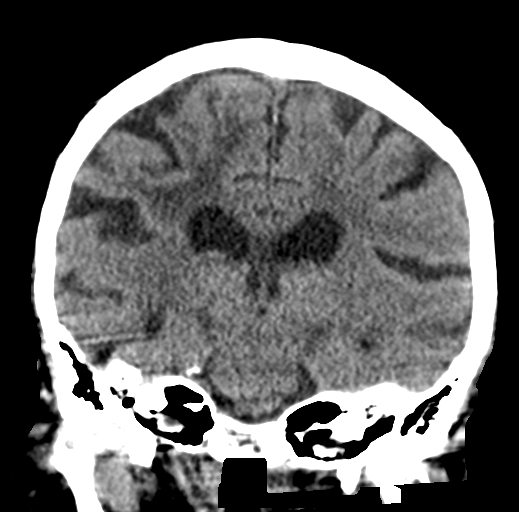
[im 34/61  brain]
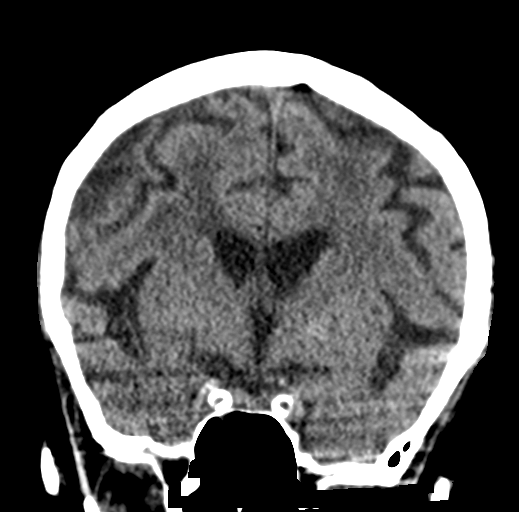

[Series 6: sagittal soft tissue · sagittal · 0.29mm/px · 3 of 51 slices shown]
[im 17/51  brain]
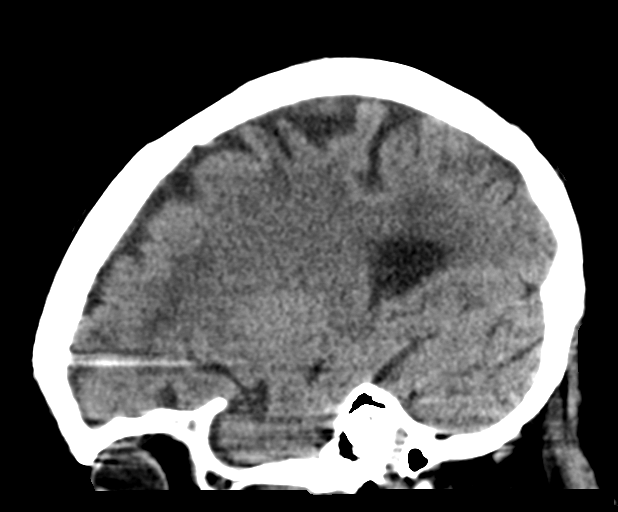
[im 26/51  brain]
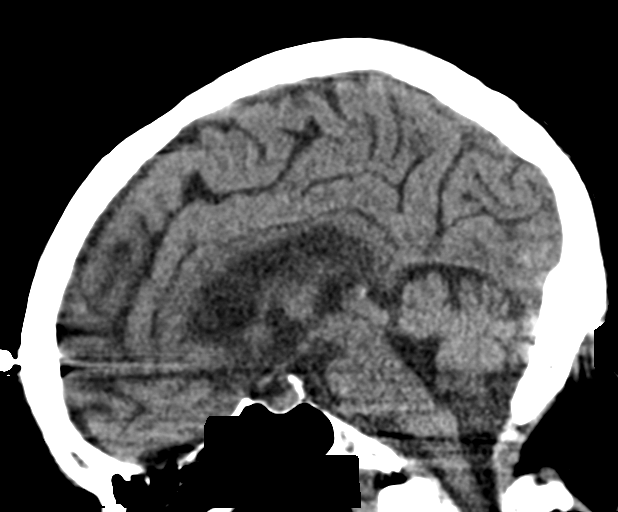
[im 34/51  brain]
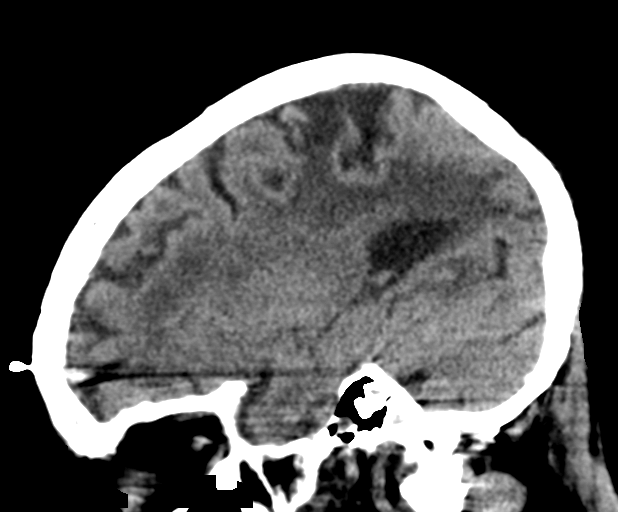

[16 of 47 positions shown; findings below may reference images not displayed]

FINDINGS: Brain: Chronic atrophic changes and chronic white matter ischemic
changes are seen similar to that noted on the prior exam.
Encephalomalacia changes are noted primarily on the right related to
areas of prior infarct in the distribution of the right middle
cerebral artery. No findings to suggest acute hemorrhage, acute
infarction or space-occupying mass lesion are noted.

Vascular: No hyperdense vessel or unexpected calcification.

Skull: Normal. Negative for fracture or focal lesion.

Sinuses/Orbits: No acute finding.

Other: None.
IMPRESSION: Chronic atrophic and ischemic changes.

Encephalomalacia changes consistent with the known history of prior
right MCA infarct. No acute abnormality is noted.

## 2020-10-12 IMAGING — DX DG CHEST 1V PORT
1 series · 1 of 1 positions shown · non-contrast
Comparison: 08/15/2020

CLINICAL DATA: Sepsis

EXAM:
PORTABLE CHEST 1 VIEW

[chest ap]
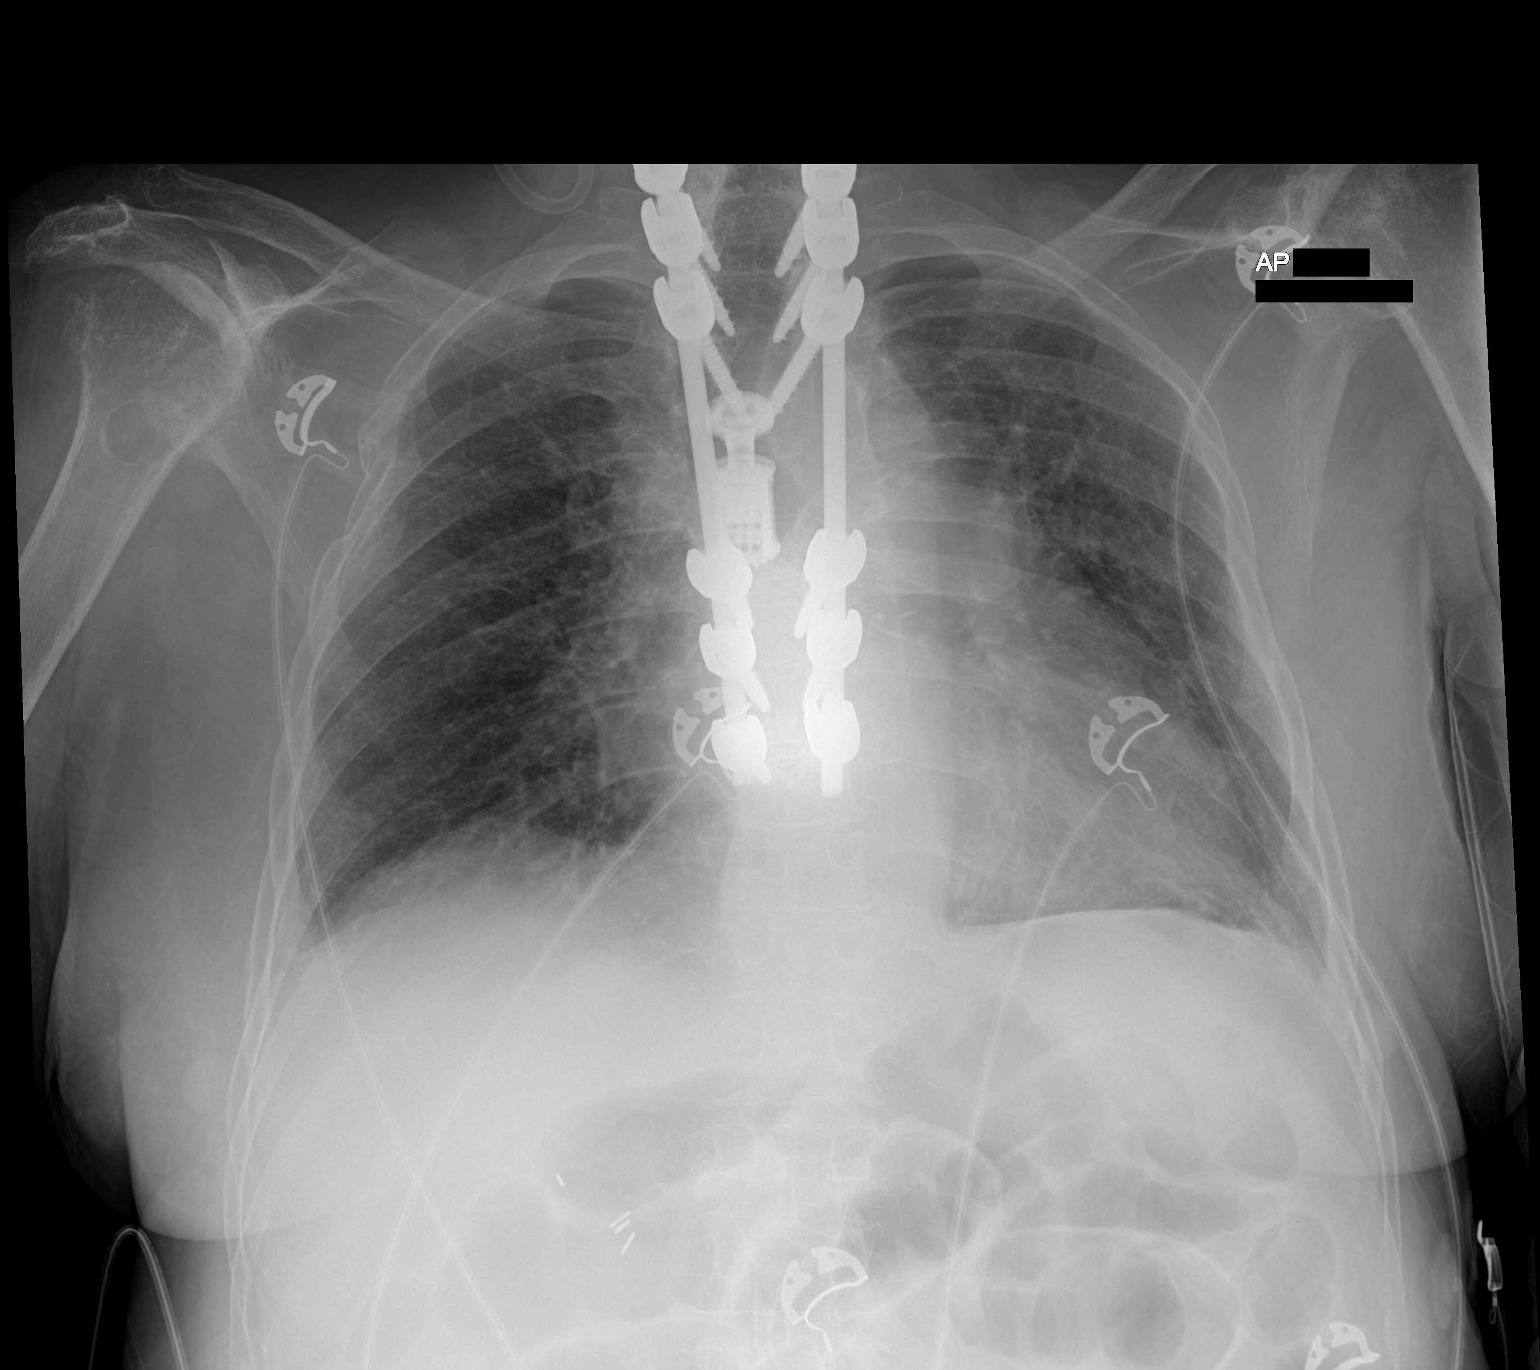

[1 of 1 positions shown; findings below may reference images not displayed]

FINDINGS: Lungs are clear. No pneumothorax or pleural effusion. Cardiac size
is within normal limits. Cervicothoracic fusion with instrumentation
is again visualized, not well assessed on this exam. Multiple remote
right rib fractures are again identified. Degenerative changes are
noted within the shoulders bilaterally
IMPRESSION: No active disease.  No interval change.

## 2020-10-12 IMAGING — CT CT CHEST W/O CM
2 of 4 series · 13 of 36 positions shown, 16 images · non-contrast
Comparison: Chest radiograph dated 08/26/2020 and CT of the abdomen
pelvis dated 08/15/2020. CT dated 03/24/2020.

CLINICAL DATA: 64-year-old female with chest pain and shortness of
breath.

EXAM:
CT CHEST, ABDOMEN AND PELVIS WITHOUT CONTRAST
TECHNIQUE: Multidetector CT imaging of the chest, abdomen and pelvis was
performed following the standard protocol without IV contrast.

[Series 1: cap w/o · axial · non-contrast · 0.64mm/px · z∈[-756,-282]mm · 10 of 113 slices shown, 13 images]
[im 9/113  mediastinal]
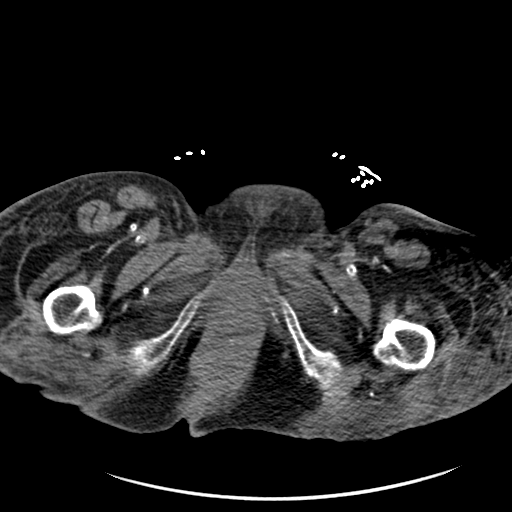
[im 9/113  lung]
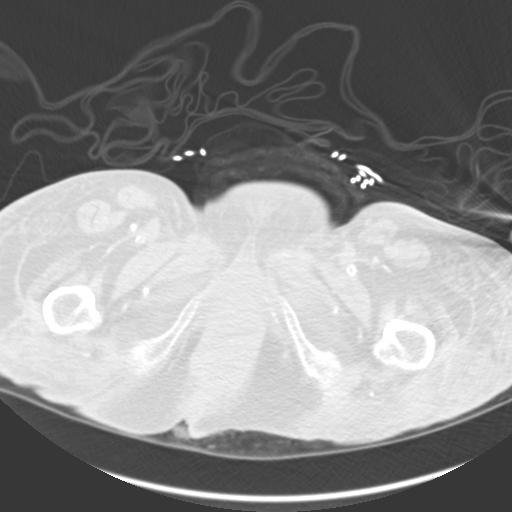
[im 18/113  lung]
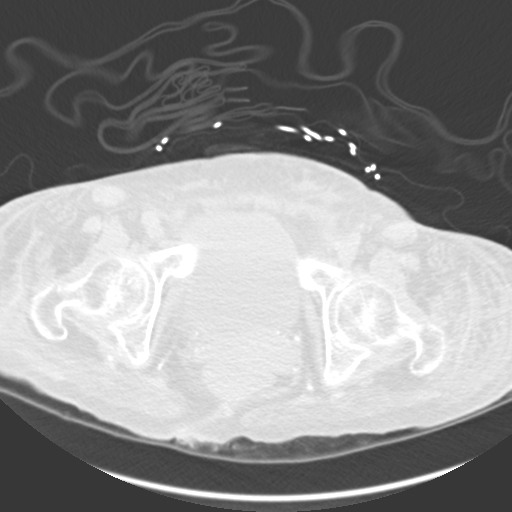
[im 35/113  lung]
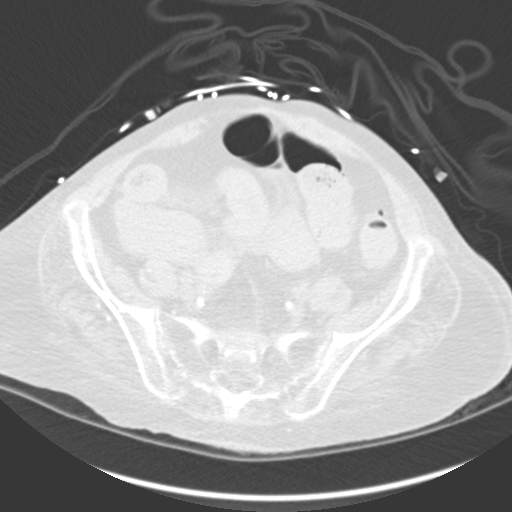
[im 44/113  lung]
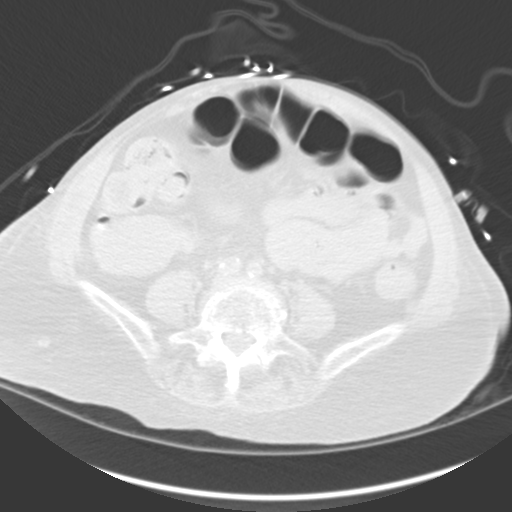
[im 52/113  mediastinal]
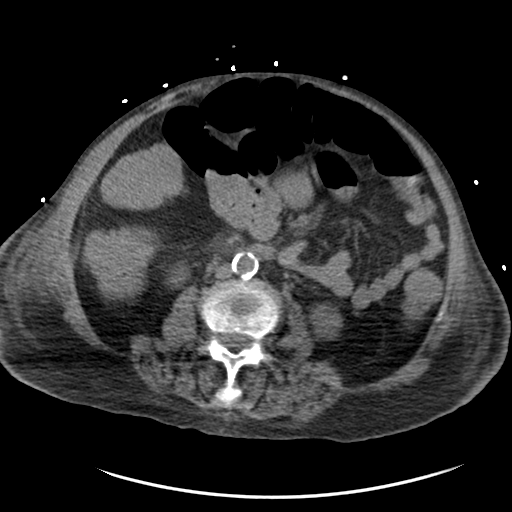
[im 52/113  lung]
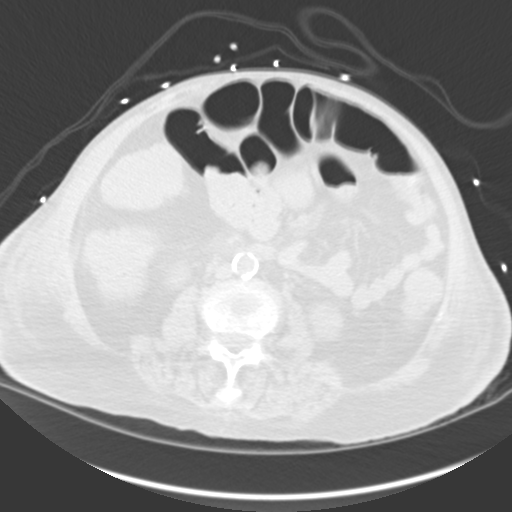
[im 61/113  lung]
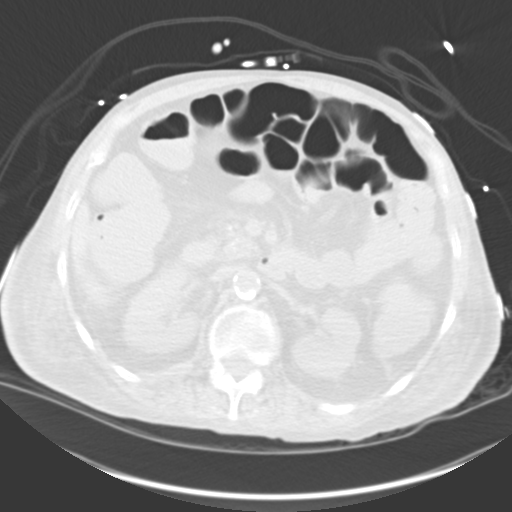
[im 69/113  lung]
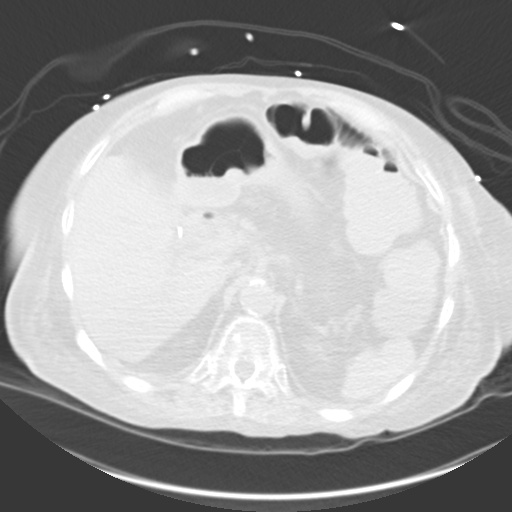
[im 87/113  lung]
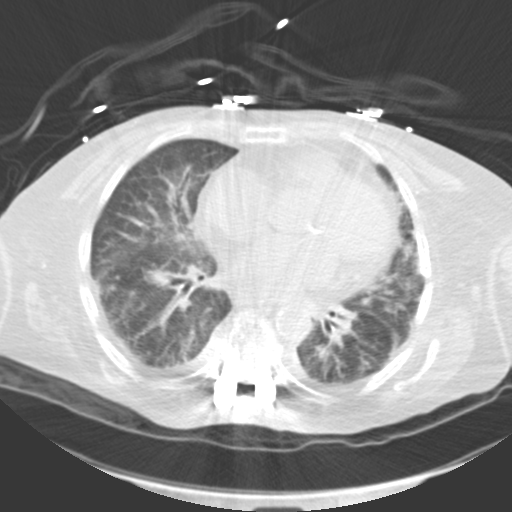
[im 95/113  mediastinal]
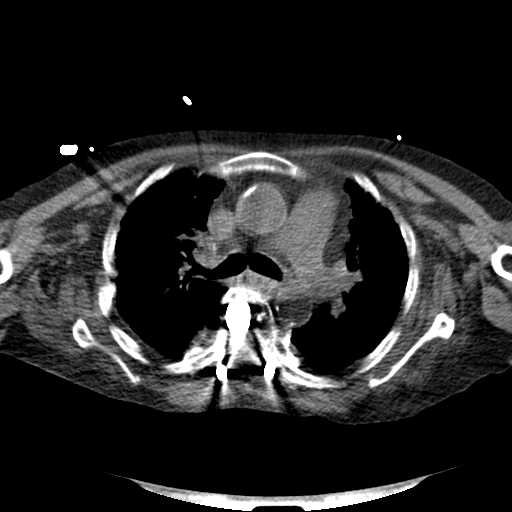
[im 95/113  lung]
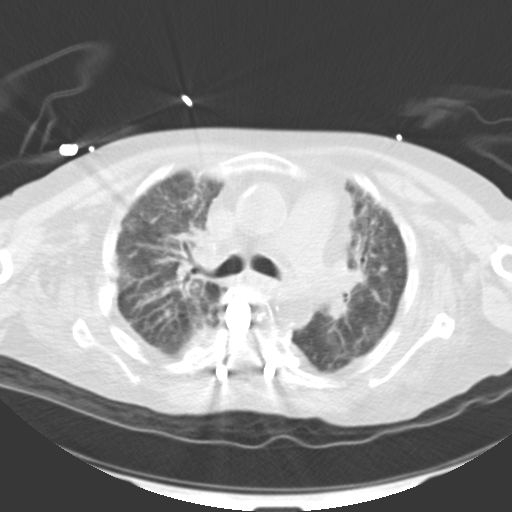
[im 104/113  lung]
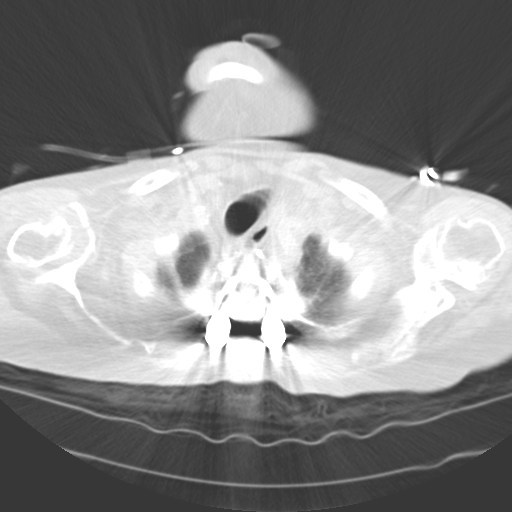

[Series 5: coronals · coronal · 0.72mm/px · 3 of 135 slices shown]
[im 27/135  lung]
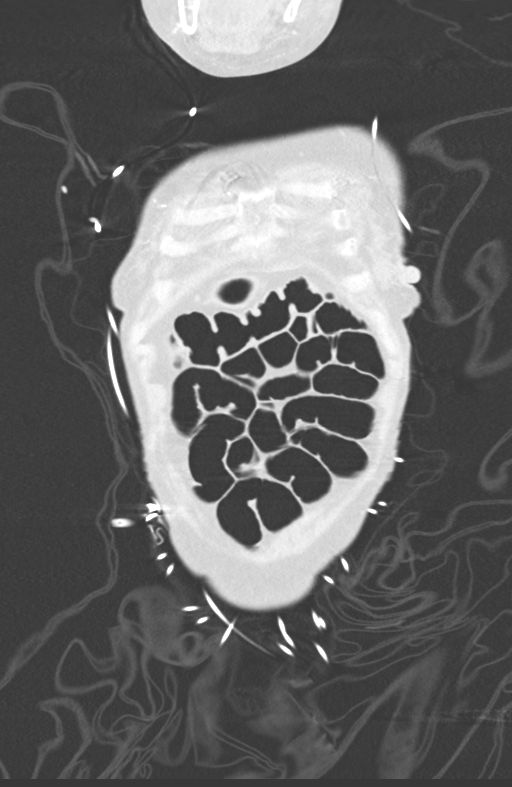
[im 54/135  lung]
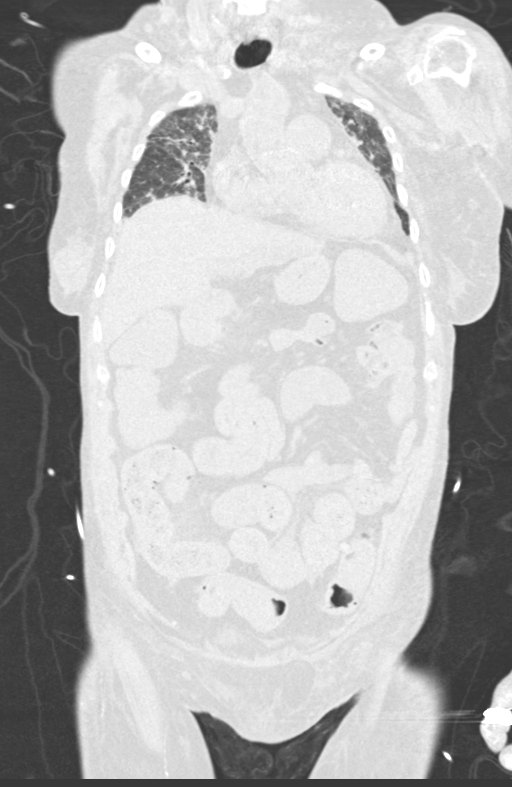
[im 81/135  lung]
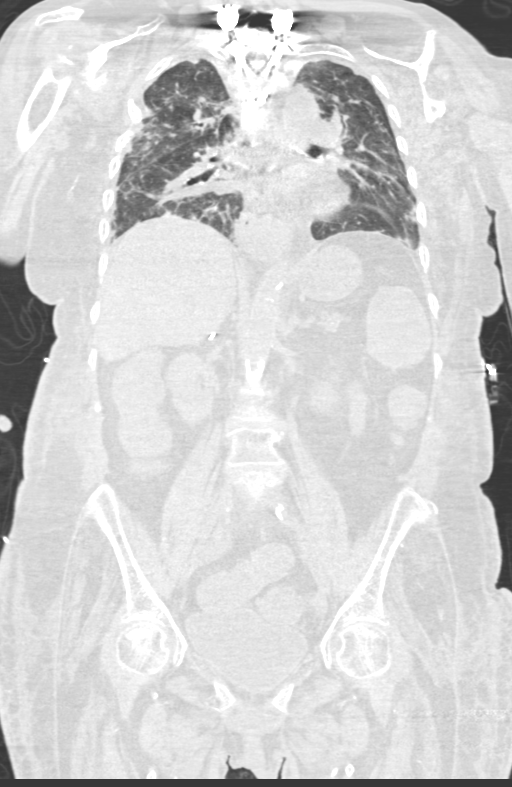

[13 of 36 positions shown; findings below may reference images not displayed]

FINDINGS: CT CHEST FINDINGS

Evaluation of this exam is limited in the absence of intravenous
contrast as well as due to streak artifact caused by spinal
hardware.

Cardiovascular: Top-normal cardiac size. No pericardial effusion.
There is atherosclerotic calcification of the thoracic aorta. No
aneurysmal dilatation. The central pulmonary arteries are grossly
unremarkable.

Mediastinum/Nodes: No hilar or mediastinal adenopathy. There is a
small hiatal hernia. The esophagus is grossly unremarkable. No
mediastinal fluid collection.

Lungs/Pleura: Small bilateral pleural effusions. There is diffuse
interstitial streaky and bandlike densities with peribronchial
thickening, new since the CT of 03/24/2020 and may represent edema,
or pneumonia or combination. Clinical correlation is recommended.
There is no pneumothorax. The central airways are patent.

Musculoskeletal: Degenerative changes of the spine and several
bilateral old healed rib fractures. Extensive posterior spinal
fusion and prior T4-T5 corpectomy. No acute osseous pathology.

CT ABDOMEN PELVIS FINDINGS

No intra-abdominal free air or free fluid.

Hepatobiliary: Cirrhosis. No intrahepatic biliary ductal dilatation.
Cholecystectomy.

Pancreas: Moderate pancreatic atrophy with scattered coarse
calcification sequela of chronic pancreatitis. No active
inflammatory changes.

Spleen: Normal in size without focal abnormality.

Adrenals/Urinary Tract: The adrenal glands unremarkable. The
kidneys, visualized ureters, and urinary bladder appear
unremarkable.

Stomach/Bowel: There is a small hiatal hernia. There is sigmoid
diverticulosis without active inflammatory changes. Loose stool
noted throughout the colon compatible with diarrheal state.
Correlation with clinical exam and stool cultures recommended. There
is no bowel obstruction. The appendix is not visualized with
certainty. No inflammatory changes identified in the right lower
quadrant.

Vascular/Lymphatic: Advanced aortoiliac atherosclerotic disease. No
portal venous gas. There is no adenopathy.

Reproductive: The uterus is grossly unremarkable. No adnexal masses.

Other: Diffuse subcutaneous soft tissue edema of the pelvis and
buttock. No fluid collection.

Musculoskeletal: Osteopenia with degenerative changes of the spine.
No acute osseous pathology. Old right pubic bone fracture and
bilateral femoral head avascular necrosis. Old compression fracture
of the L1 and L2 and L3.
IMPRESSION: 1. Small bilateral pleural effusions with diffuse interstitial
streaky and bandlike densities, new since the CT of 03/24/2020 and
may represent edema, or pneumonia or combination. Clinical
correlation is recommended.
2. Diarrheal state. Correlation with clinical exam and stool
cultures recommended. No bowel obstruction.
3. Sigmoid diverticulosis.
4. Cirrhosis.
5. Aortic Atherosclerosis ([id number]).

## 2020-10-13 IMAGING — US US RENAL
1 series · 14 of 21 positions shown · non-contrast
Comparison: None.

CLINICAL DATA: Renal failure.

EXAM:
RENAL / URINARY TRACT ULTRASOUND COMPLETE

[Series 1: us renal · 14 of 21 slices shown]
[im 1/21]
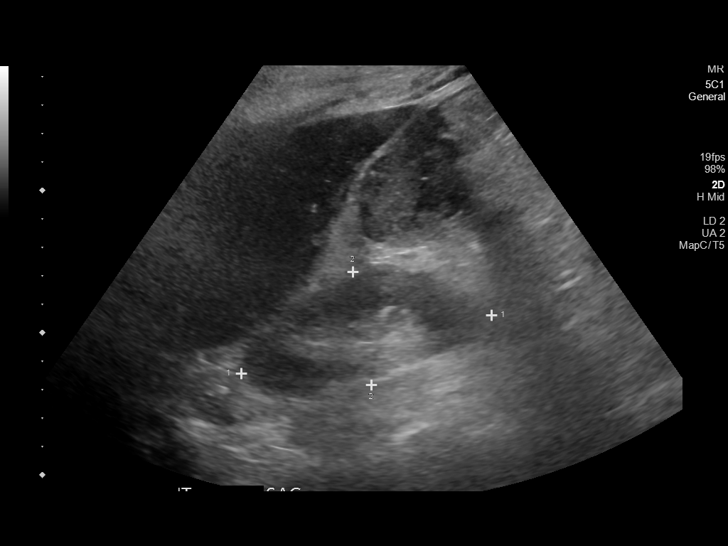
[im 3/21]
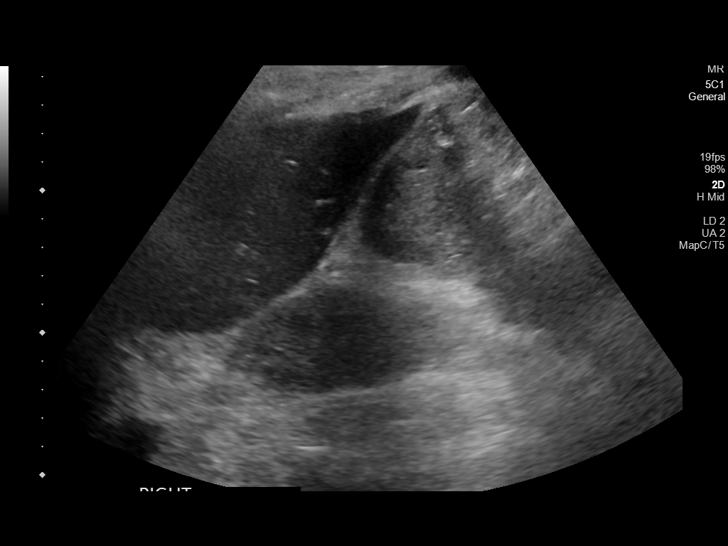
[im 4/21]
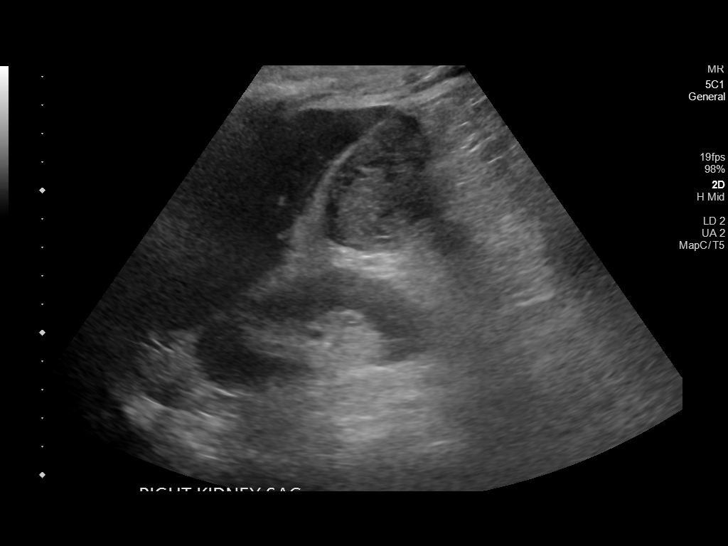
[im 6/21]
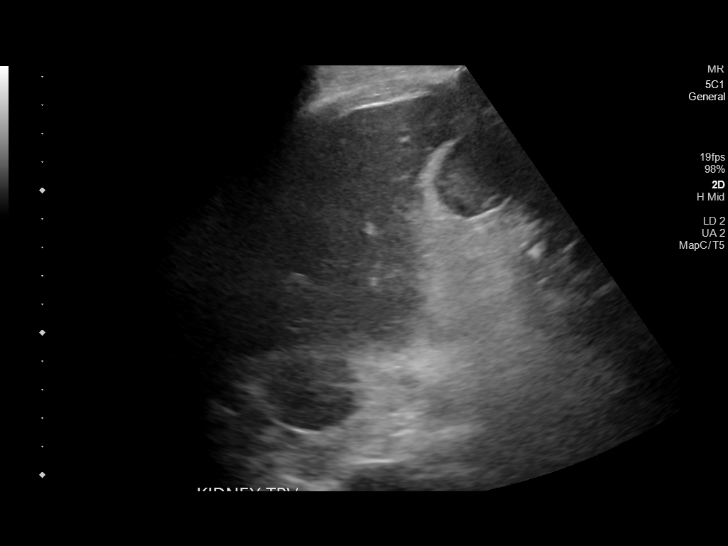
[im 7/21]
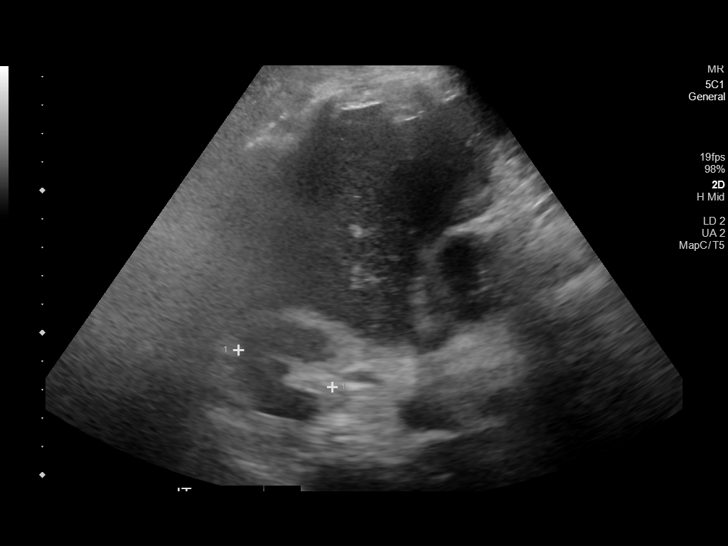
[im 9/21]
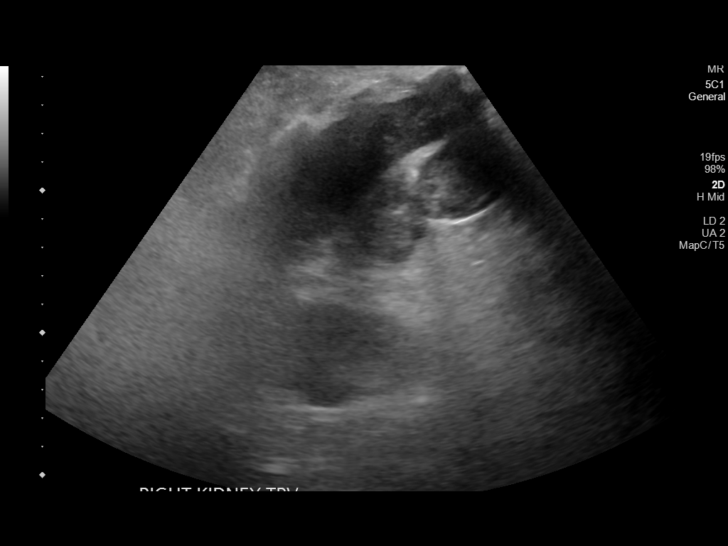
[im 10/21]
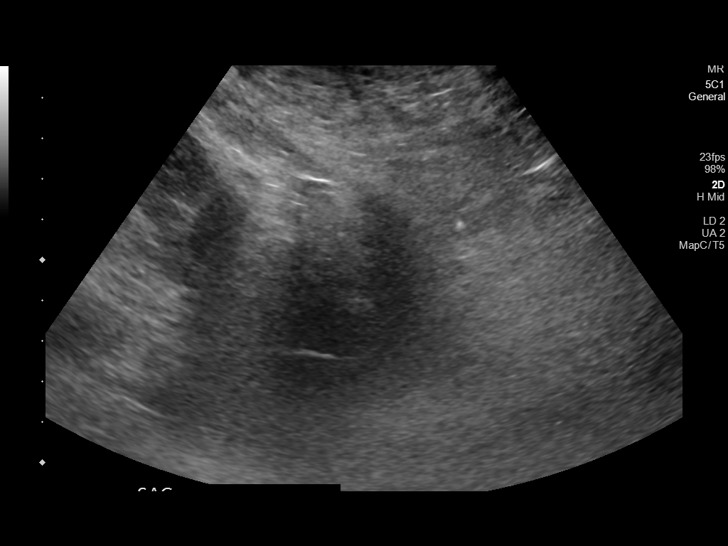
[im 12/21]
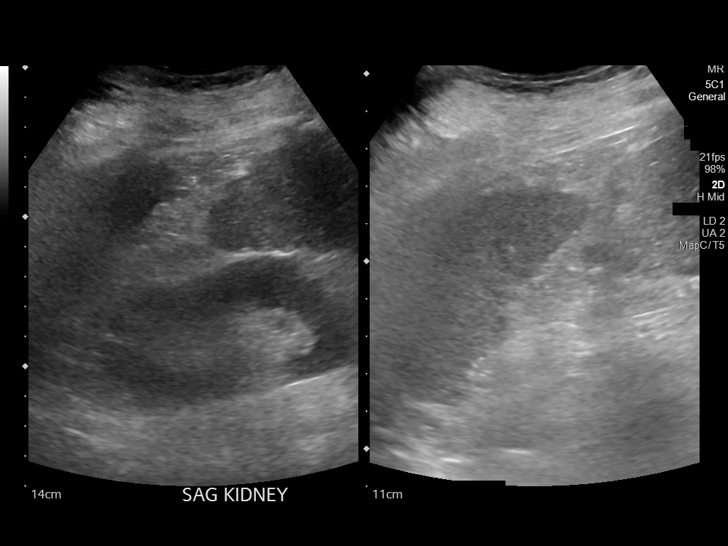
[im 13/21]
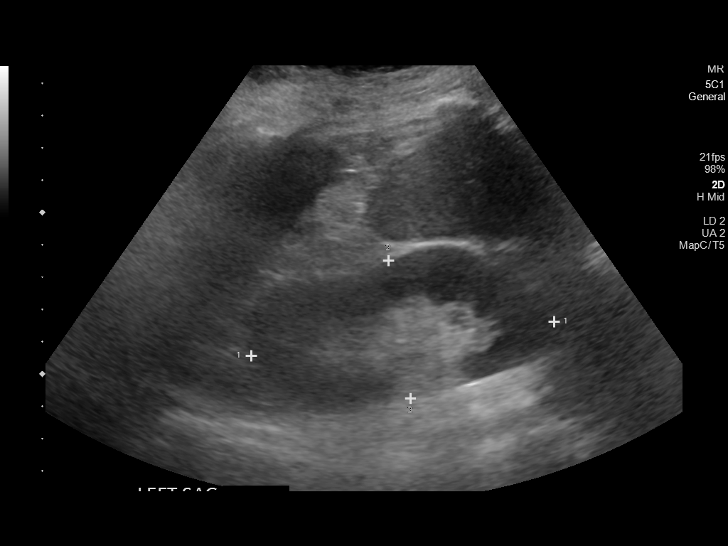
[im 15/21]
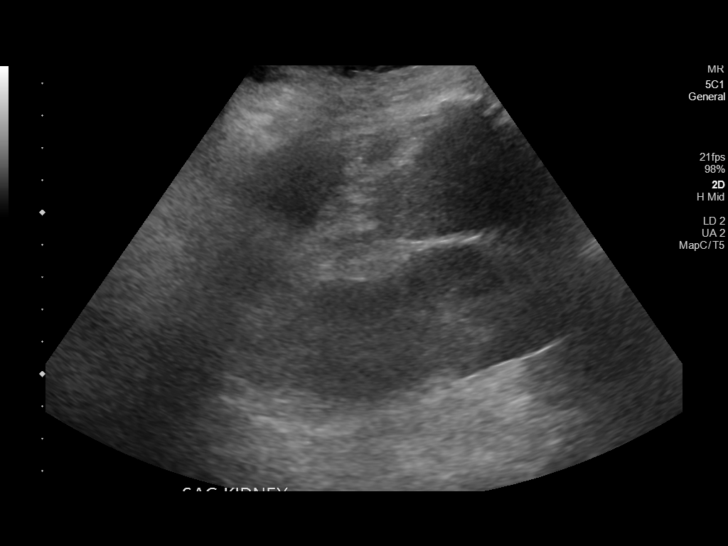
[im 16/21]
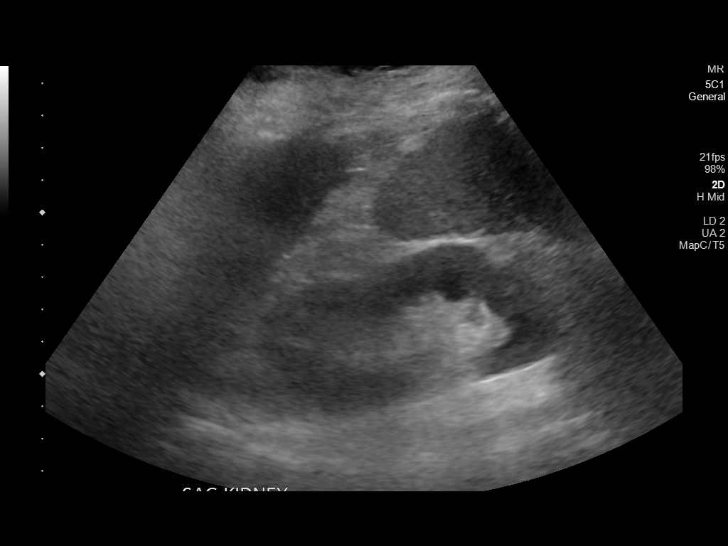
[im 18/21]
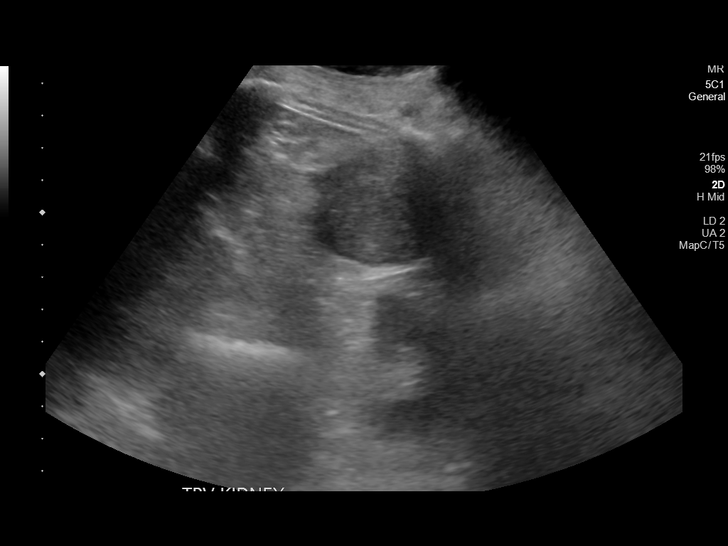
[im 19/21]
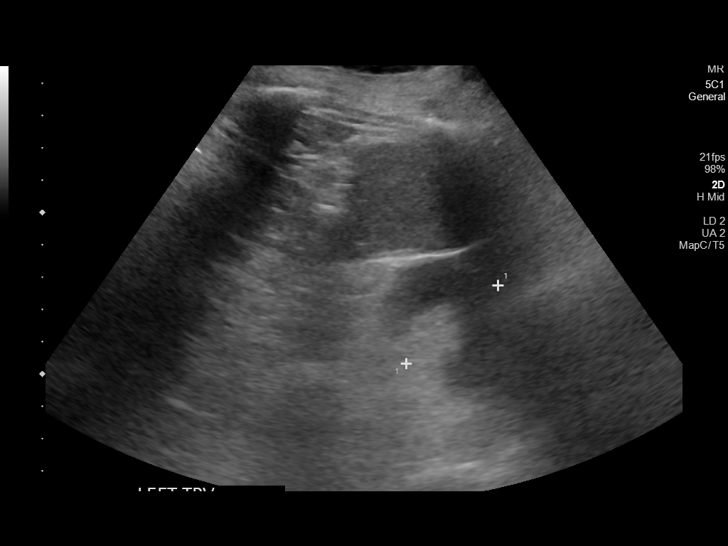
[im 21/21]
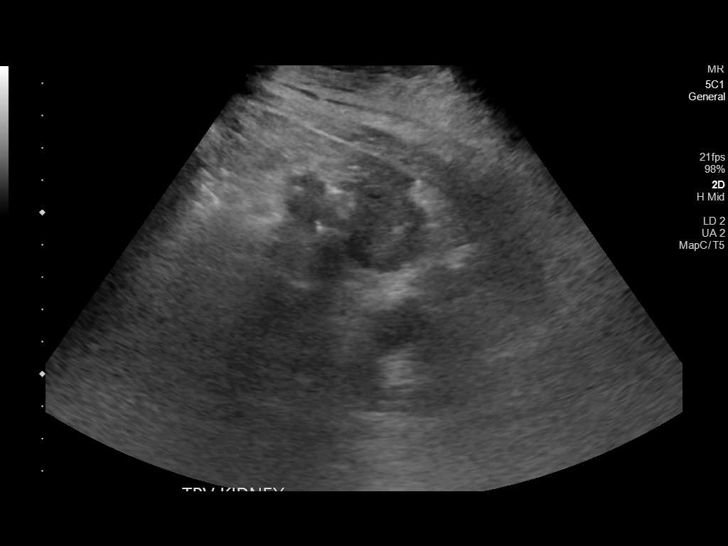

[14 of 21 positions shown; findings below may reference images not displayed]

FINDINGS: Right Kidney:

Renal measurements: 9.0 x 4.0 x 3.5 cm = volume: 68 mL. Echogenicity
within normal limits. No mass or hydronephrosis visualized.

Left Kidney:

Renal measurements: 9.4 x 4.3 x 3.7 cm = volume: 80 mL. Echogenicity
within normal limits. No mass or hydronephrosis visualized.

Bladder:

Not visualized as it appears to be completely decompressed.

Other:

None.
IMPRESSION: Normal renal ultrasound.

## 2020-10-14 IMAGING — CT CT HEAD CODE STROKE
4 of 8 series · 13 of 47 positions shown, 15 images · non-contrast
Comparison: Prior CT from 08/26/2020.

CLINICAL DATA: Code stroke. Initial evaluation for acute neuro
deficit, possible seizure activity.

EXAM:
CT HEAD WITHOUT CONTRAST
TECHNIQUE: Contiguous axial images were obtained from the base of the skull
through the vertex without intravenous contrast.

[Series 4: coronal soft tissue · coronal · 0.31mm/px · 3 of 65 slices shown]
[im 17/65  brain]
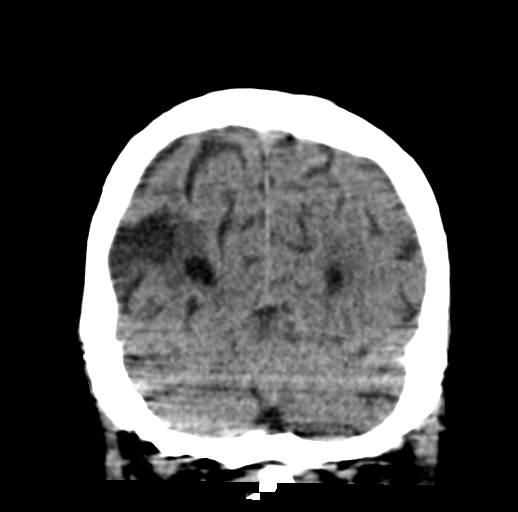
[im 33/65  brain]
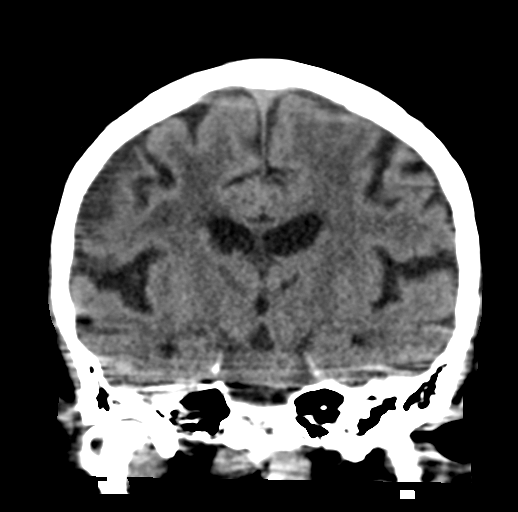
[im 49/65  brain]
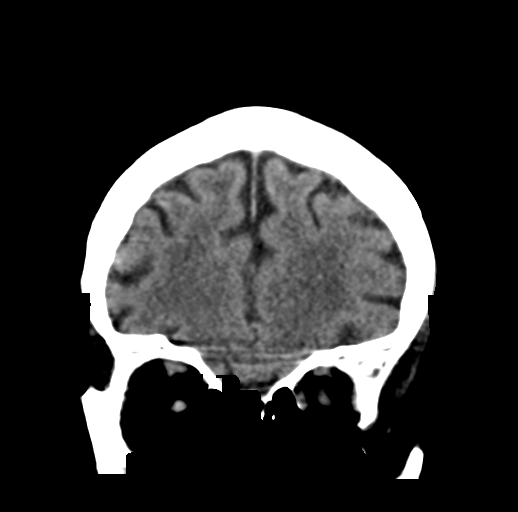

[Series 9: sagittal soft tissue · sagittal · 0.33mm/px · 2 of 52 slices shown]
[im 18/52  brain]
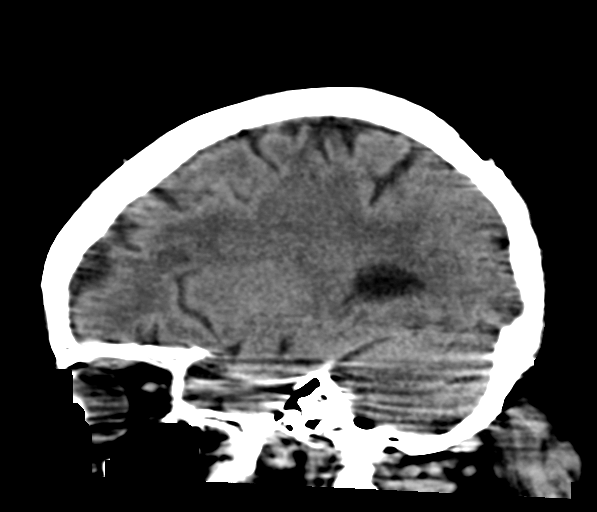
[im 35/52  brain]
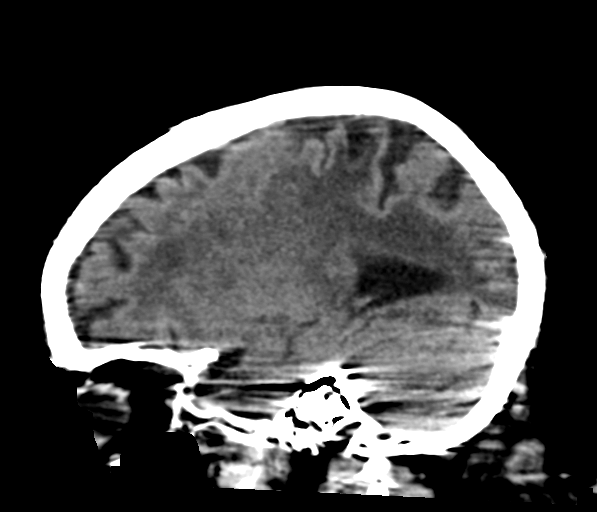

[Series 10: true axial · axial · 0.30mm/px · z∈[-119,-1]mm · 7 of 54 slices shown, 9 images]
[im 7/54  brain]
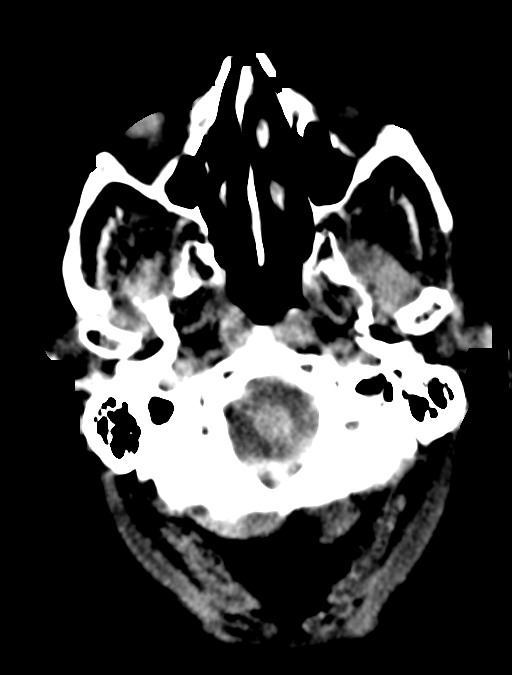
[im 7/54  bone]
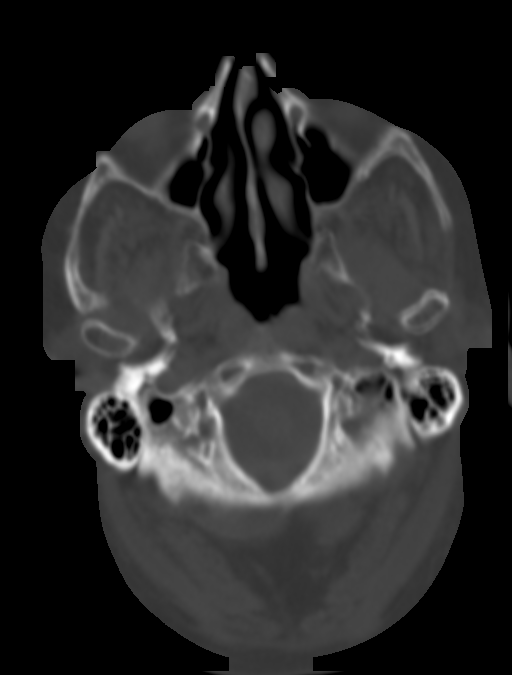
[im 14/54  brain]
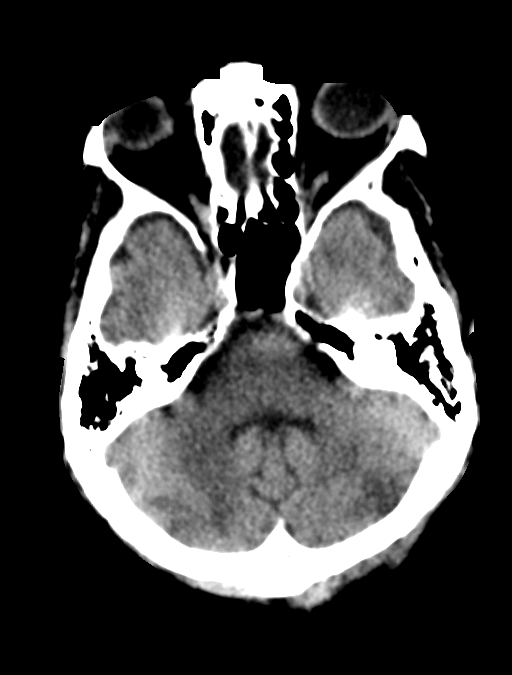
[im 20/54  brain]
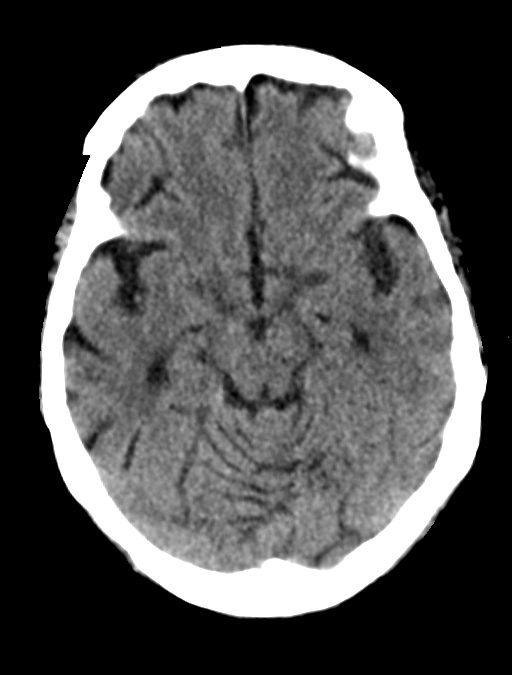
[im 27/54  brain]
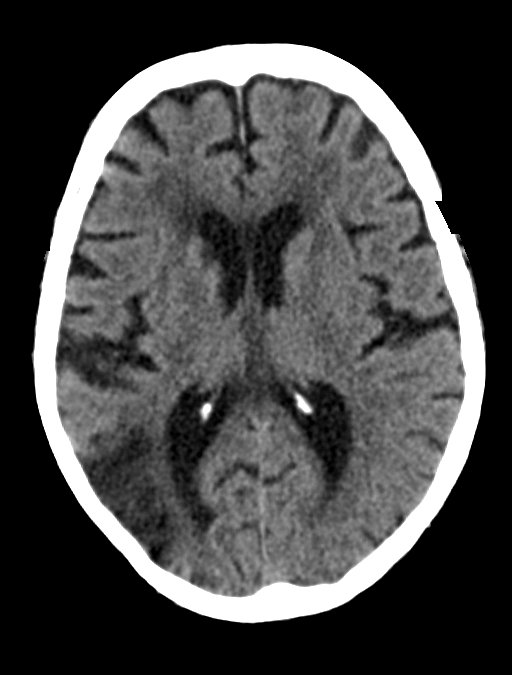
[im 34/54  brain]
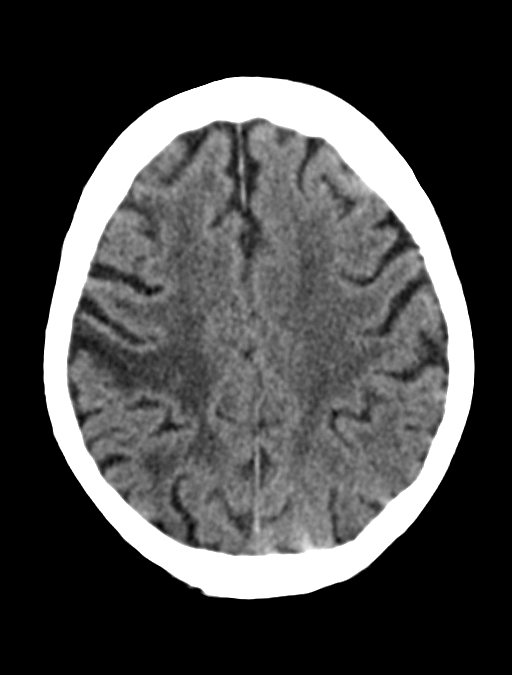
[im 34/54  bone]
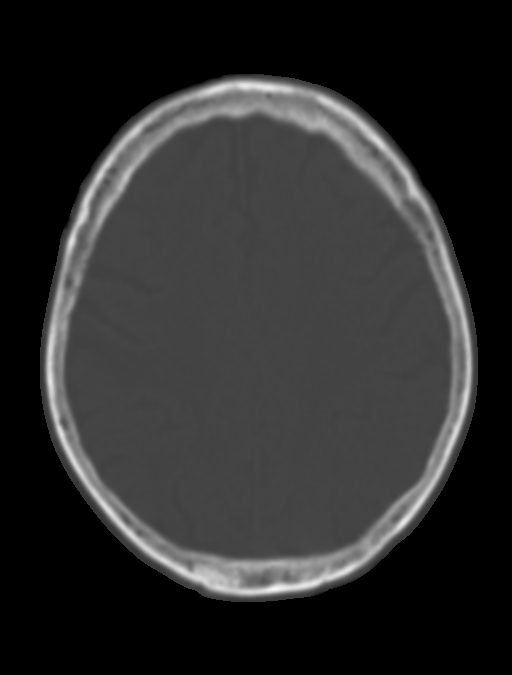
[im 40/54  brain]
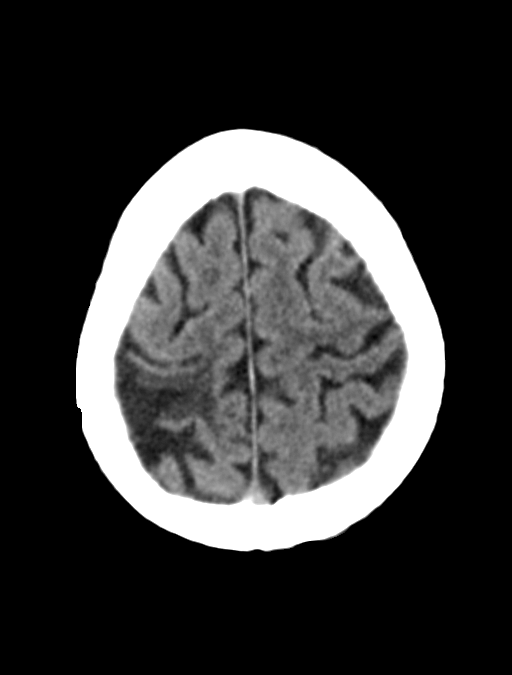
[im 47/54  brain]
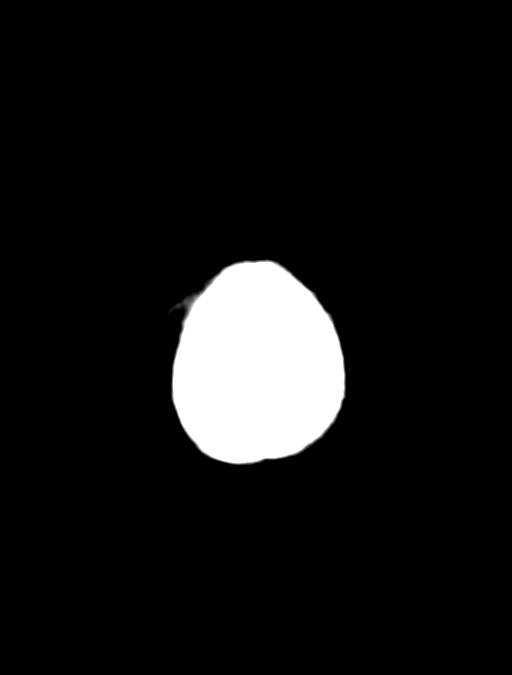

[Series 11: true axial 1 · axial · 0.30mm/px · 1 of 56 slices shown]
[im 7/56  brain]
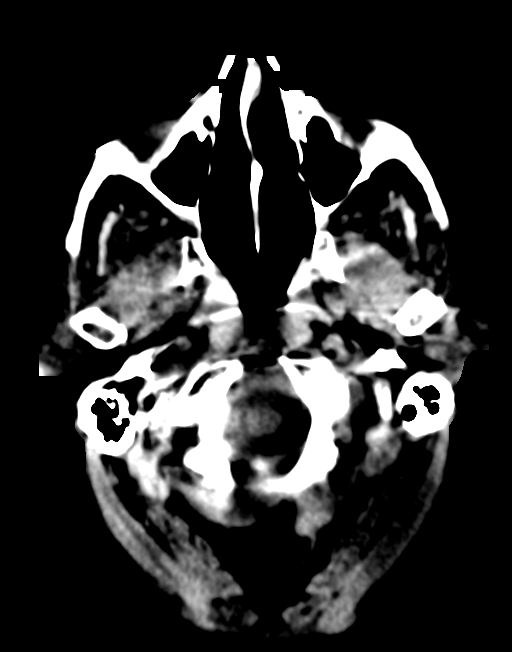

[13 of 47 positions shown; findings below may reference images not displayed]

FINDINGS: Brain: Advanced age-related cerebral atrophy. Multifocal ischemic
infarcts involving the right frontal, parietal, and occipital
regions, left frontal lobe, and cerebellum. Remote lacunar infarcts
noted involving the bilateral thalami and left basal ganglia.
Appearance is relatively stable.

No acute intracranial hemorrhage. No acute large vessel territory
infarct. No mass lesion or midline shift. No hydrocephalus or
extra-axial fluid collection.

Vascular: No hyperdense vessel. Calcified atherosclerosis present at
skull base.

Skull: Scalp soft tissues and calvarium within normal limits.

Sinuses/Orbits: Globes and orbital soft tissues demonstrate no acute
finding. Paranasal sinuses are clear. No mastoid effusion.

Other: None.

ASPECTS (Alberta Stroke Program Early CT Score)

- Ganglionic level infarction (caudate, lentiform nuclei, internal
capsule, insula, M1-M3 cortex): 7

- Supraganglionic infarction (M4-M6 cortex): 3

Total score (0-10 with 10 being normal): 10
IMPRESSION: 1. Stable head CT.  No acute intracranial abnormality identified
2. ASPECTS is 10.
3. Advanced cerebral atrophy with chronic microvascular ischemic
disease, with multiple remote ischemic infarcts as above.

Critical Value/emergent results were called by telephone at the time
of interpretation on 08/28/2020 at [DATE] to provider SATU-PIA AAPA
, who verbally acknowledged these results.

## 2020-10-15 IMAGING — DX DG CHEST 1V PORT
1 series · 1 of 1 positions shown · non-contrast
Comparison: Chest radiograph dated 08/26/2020.

CLINICAL DATA: 64-year-old female with ET placement.

EXAM:
PORTABLE CHEST 1 VIEW

[chest]
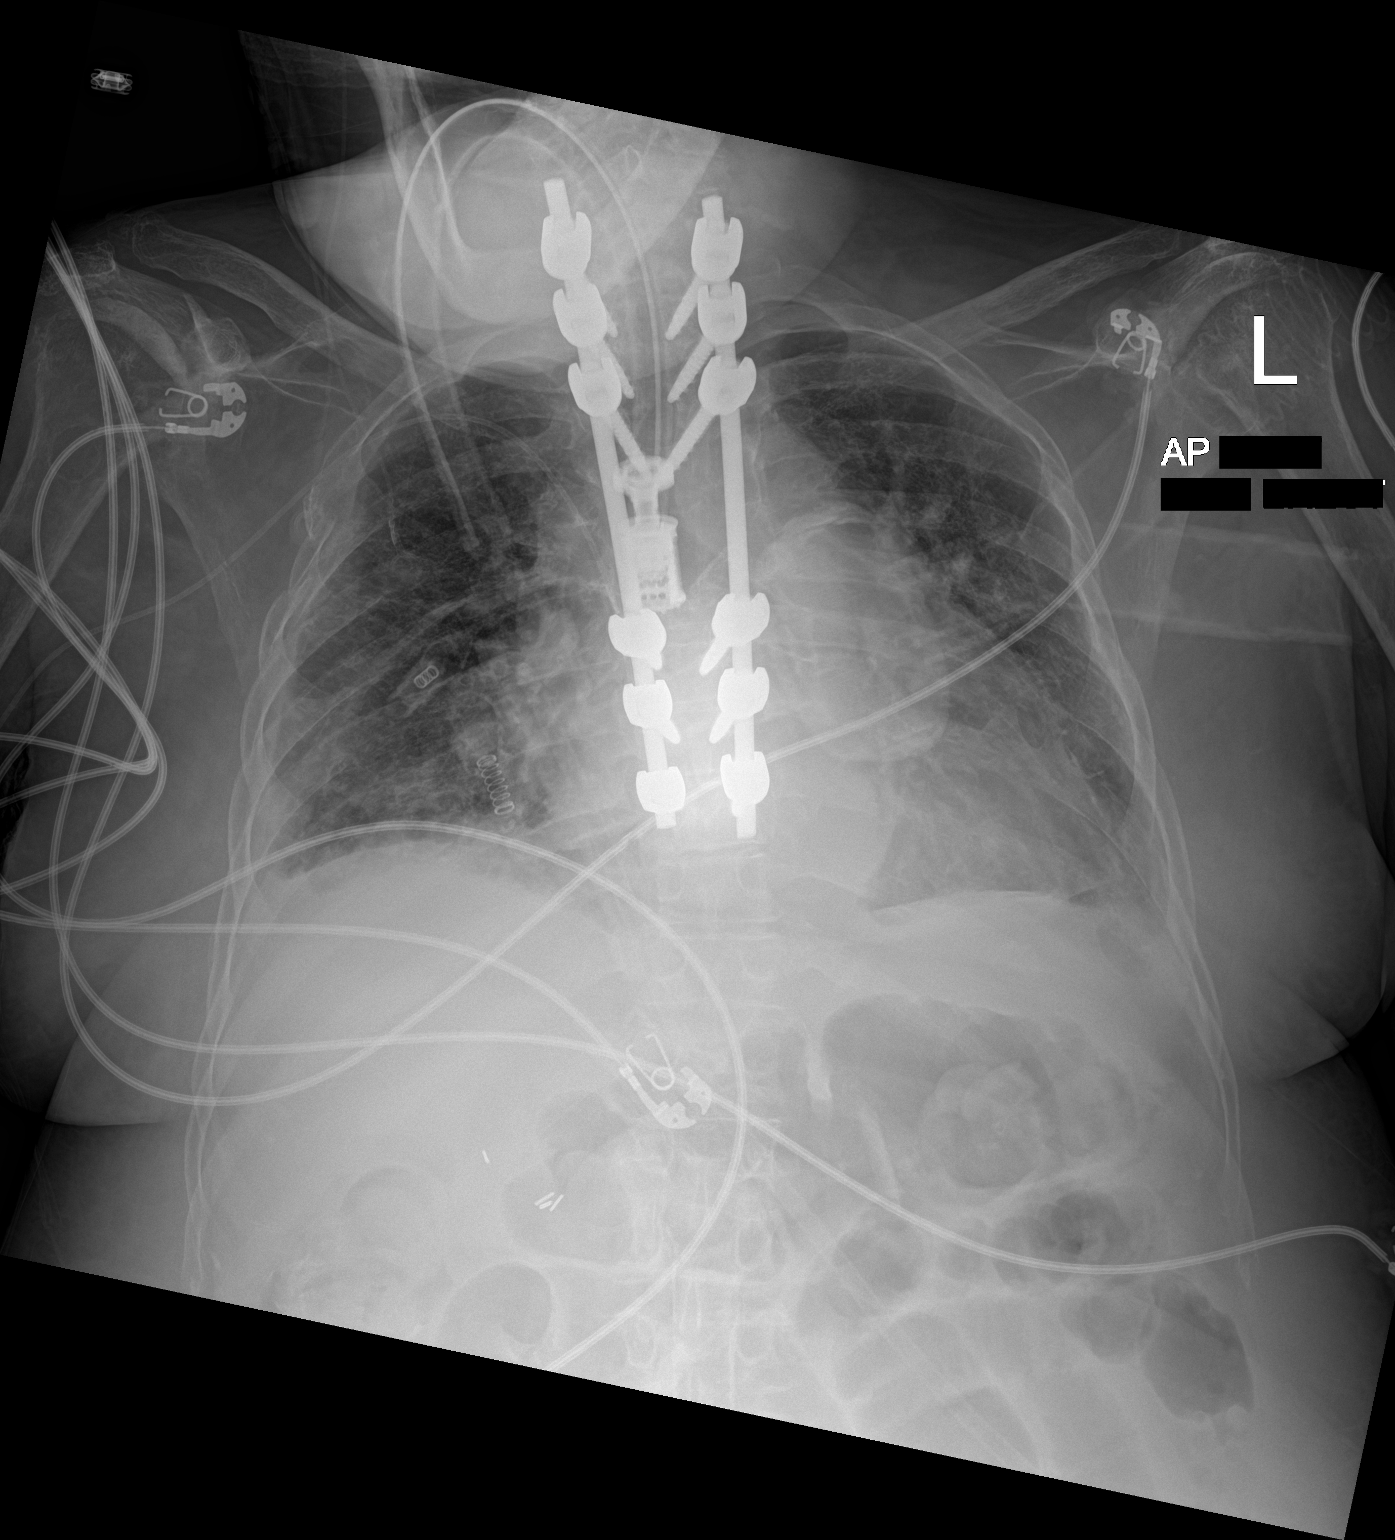

[1 of 1 positions shown; findings below may reference images not displayed]

FINDINGS: Right-sided PICC with tip over central SVC close to the cavoatrial
junction. An endotracheal tube is noted. Evaluation for positioning
of the tip is very limited due to superimposition of the thoracic
hardware. The tip of the endotracheal tube projects over the
thoracic cage, likely at or just above the carina. Bilateral
pulmonary streaky densities have progressed since the prior
radiograph. There are small bilateral pleural effusions. No
pneumothorax. There is stable cardiomegaly. No acute osseous
pathology.
IMPRESSION: 1. Right-sided PICC with tip over central SVC close to the
cavoatrial junction.
2. Endotracheal tube with tip over the thoracic cage.
3. Progressive bilateral pulmonary densities since the prior
radiograph.

## 2020-10-16 IMAGING — DX DG ABDOMEN 1V
1 series · 1 of 1 positions shown · non-contrast
Comparison: 03/29/2020

CLINICAL DATA: NG tube placement.

EXAM:
ABDOMEN - 1 VIEW

[abdomen kub]
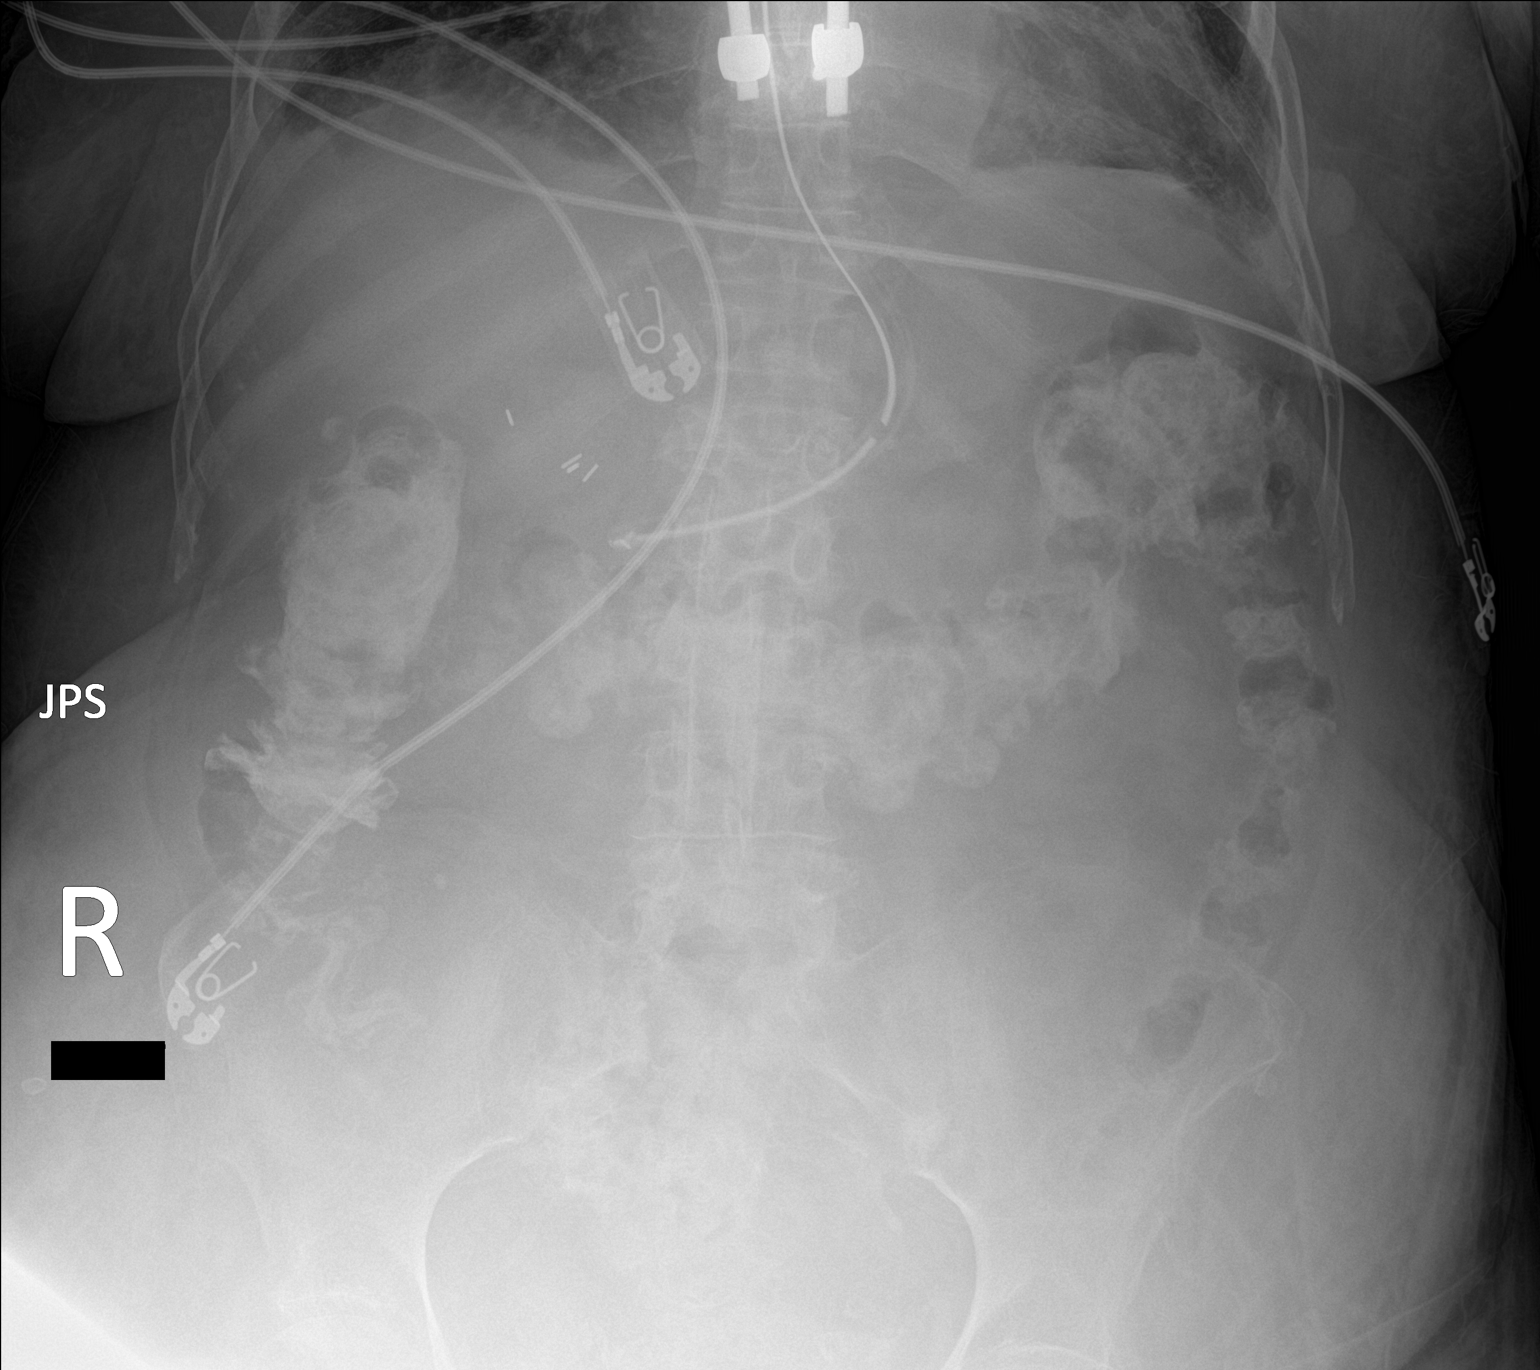

[1 of 1 positions shown; findings below may reference images not displayed]

FINDINGS: NG tube tip is in the distal stomach. No gaseous distention of
stomach, small bowel or colon. Contrast material in the colon is
compatible with recent abdomen/pelvis CT.
IMPRESSION: NG tube tip is in the distal stomach.

## 2020-10-17 IMAGING — DX DG CHEST 1V PORT
1 series · 1 of 1 positions shown · non-contrast
Comparison: 08/29/2020

CLINICAL DATA: Pulmonary infiltrate.  Intubated.

EXAM:
PORTABLE CHEST 1 VIEW

[chest ap]
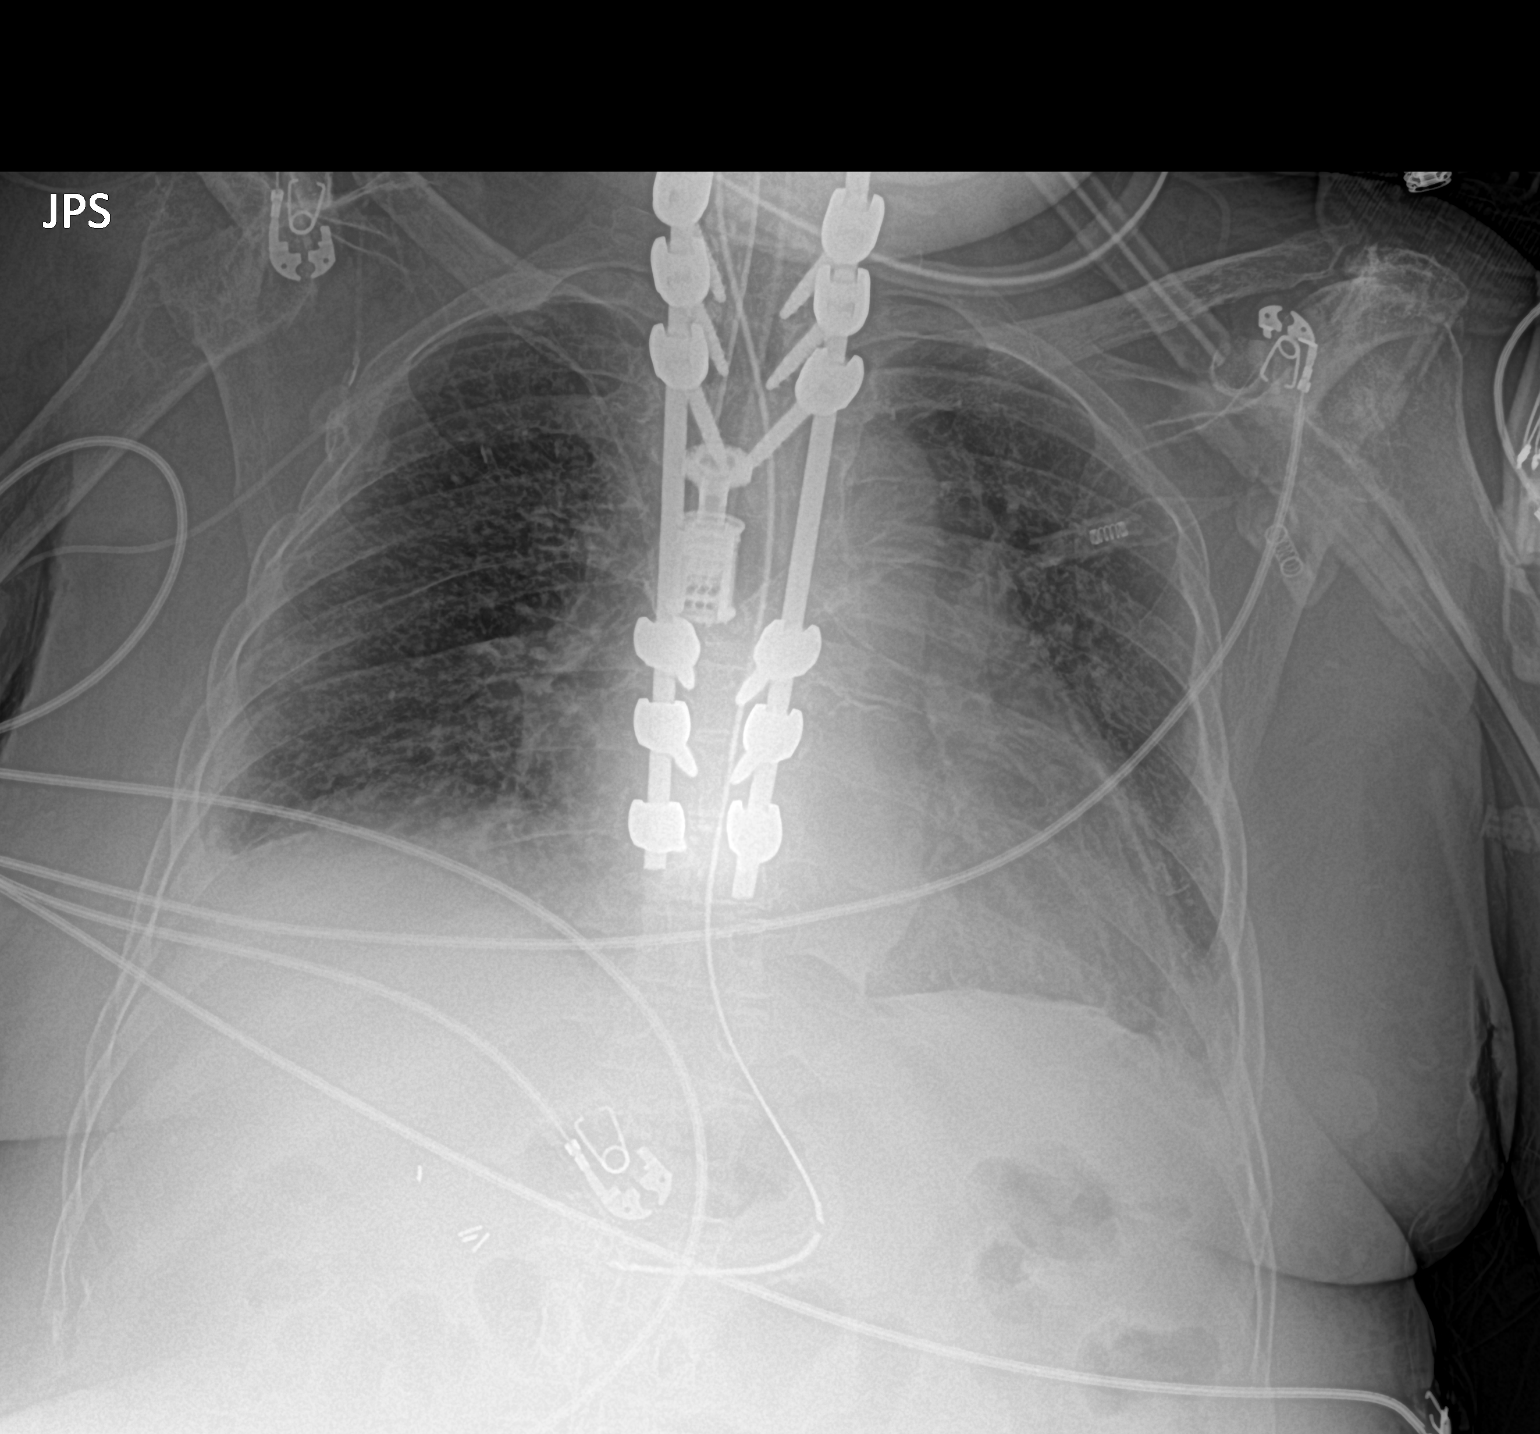

[1 of 1 positions shown; findings below may reference images not displayed]

FINDINGS: The tips of the endotracheal tube and right PICC are obscured by
spinal instrumentation. Enteric tube terminates over the stomach.
The cardiac silhouette remains mildly enlarged. The interstitial
markings are mildly prominent without overt edema. Streaky and
patchy airspace opacities bilaterally on the prior study have
improved. There are likely persistent small bilateral pleural
effusions. No pneumothorax is identified.
IMPRESSION: Improved lung aeration. Persistent small bilateral pleural
effusions.

## 2020-10-17 IMAGING — MR MR HEAD W/O CM
14 of 15 series · 43 of 48 positions shown · non-contrast
Comparison: Brain MRI 02/12/2020.  Head CT 08/28/2020.

CLINICAL DATA: 64-year-old female code stroke presentation on
08/28/2020. Possible seizure.

EXAM:
MRI HEAD WITHOUT CONTRAST
TECHNIQUE: Multiplanar, multiecho pulse sequences of the brain and surrounding
structures were obtained without intravenous contrast.

[Series 13: DWI · axial · 3.0mm · 0.88mm/px · z∈[-118,+30]mm · 6 of 104 slices shown (1 of 4)]
[im 1/104]
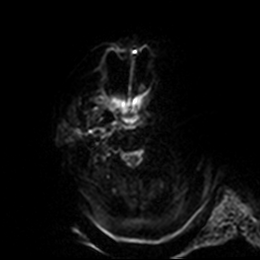
[im 21/104]
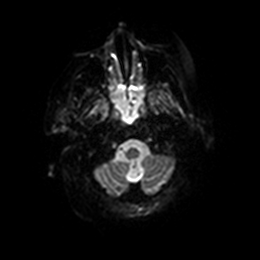
[im 42/104]
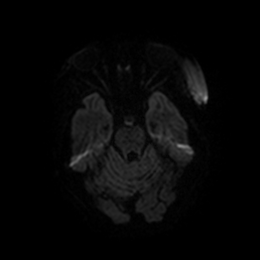
[im 62/104]
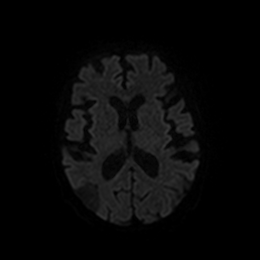
[im 83/104]
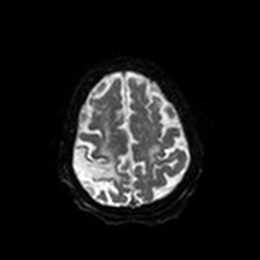
[im 104/104]
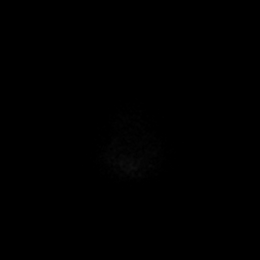

[Series 14: DWI · axial · 3.0mm · 0.88mm/px · z∈[-118,+30]mm · 3 of 52 slices shown (2 of 4)]
[im 1/52]
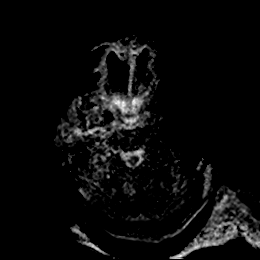
[im 26/52]
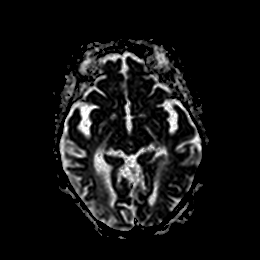
[im 52/52]
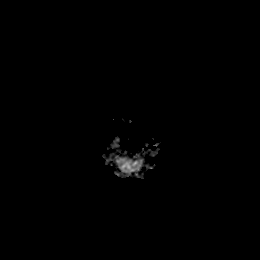

[Series 15: DWI · coronal · 4.0mm · 0.88mm/px · 4 of 72 slices shown (3 of 4)]
[im 1/72]
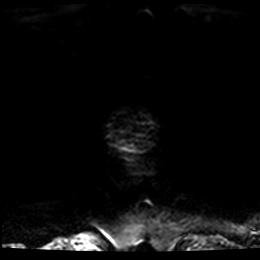
[im 24/72]
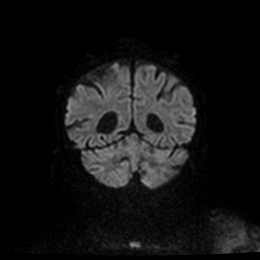
[im 48/72]
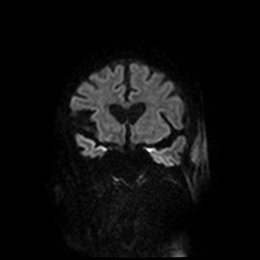
[im 72/72]
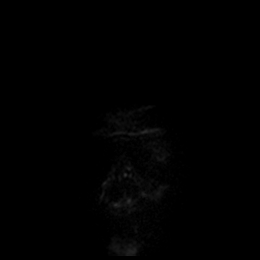

[Series 16: DWI · coronal · 4.0mm · 0.88mm/px · 3 of 36 slices shown (4 of 4)]
[im 1/36]
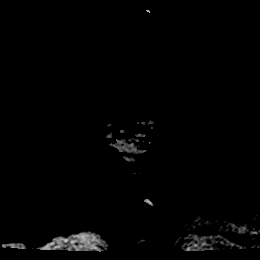
[im 18/36]
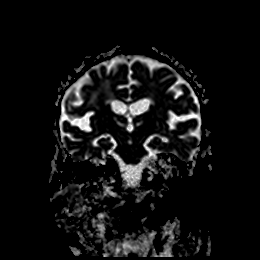
[im 36/36]
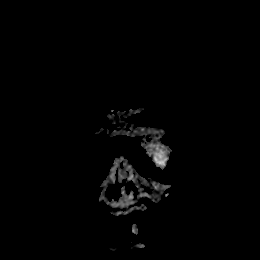

[Series 18: T2 · axial · 5.0mm · 0.72mm/px · z∈[-112,+28]mm · 2 of 25 slices shown (1 of 3)]
[im 1/25]
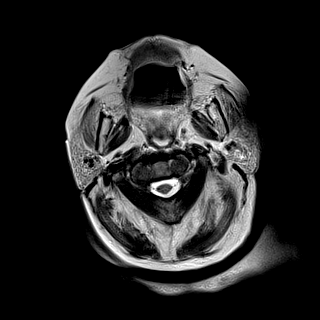
[im 25/25]
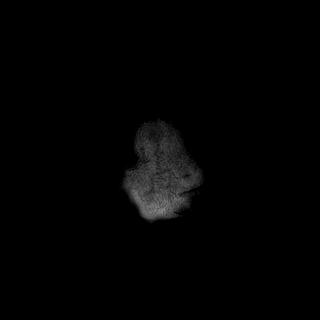

[Series 19: FLAIR · axial · 5.0mm · 0.90mm/px · z∈[-112,+28]mm · 2 of 25 slices shown (1 of 2)]
[im 1/25]
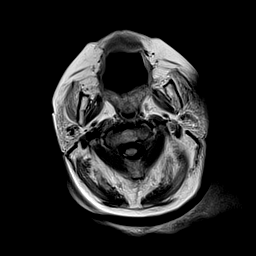
[im 25/25]
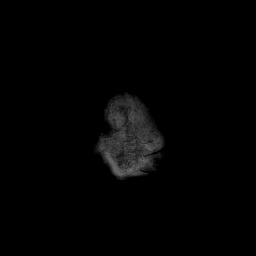

[Series 21: mag_images · axial · 3.0mm · 0.90mm/px · z∈[-116,+32]mm · 4 of 52 slices shown]
[im 1/52]
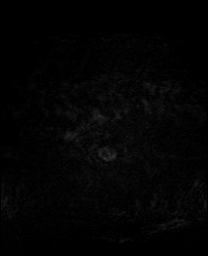
[im 18/52]
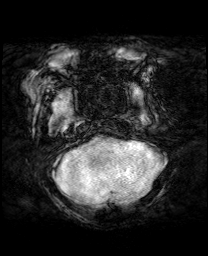
[im 35/52]
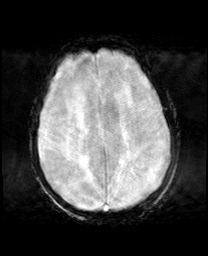
[im 52/52]
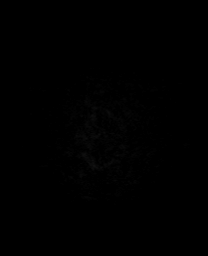

[Series 22: pha_images · axial · 3.0mm · 0.90mm/px · z∈[-110,+23]mm · 3 of 47 slices shown]
[im 1/47]
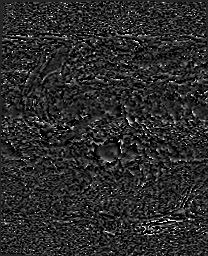
[im 24/47]
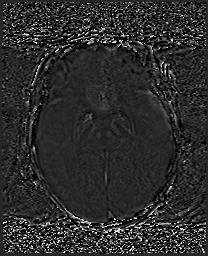
[im 47/47]
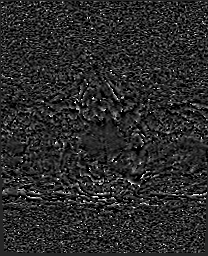

[Series 23: swi_images · axial · 3.0mm · 0.90mm/px · z∈[-116,+32]mm · 4 of 52 slices shown]
[im 1/52]
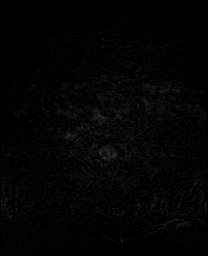
[im 18/52]
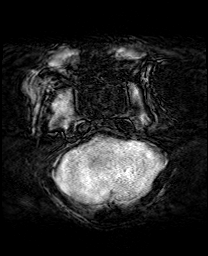
[im 35/52]
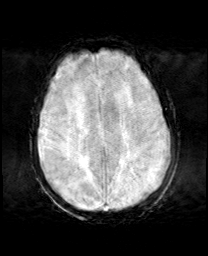
[im 52/52]
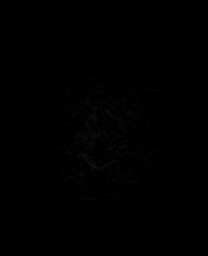

[Series 24: mip_images(sw) · axial · 24.0mm · 0.90mm/px · z∈[-106,-42]mm · 2 of 45 slices shown]
[im 1/45]
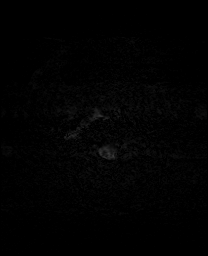
[im 23/45]
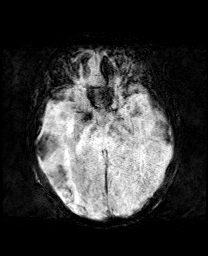

[Series 26: T2 · coronal · 5.0mm · 0.34mm/px · 2 of 29 slices shown (2 of 3)]
[im 1/29]
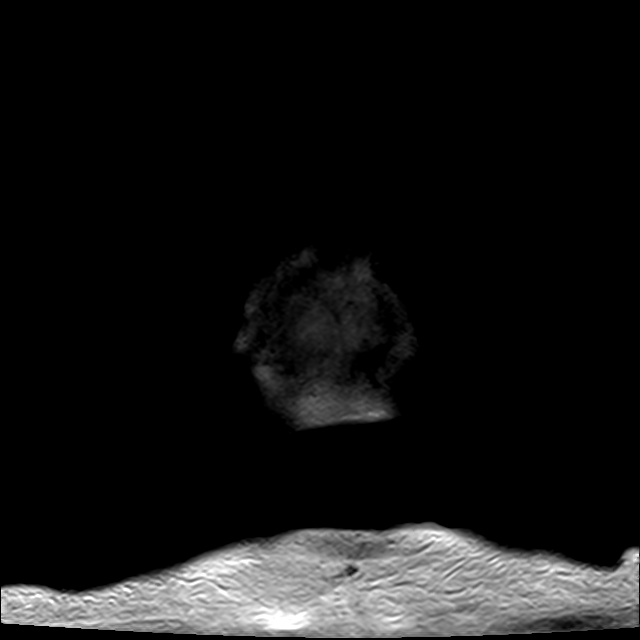
[im 29/29]
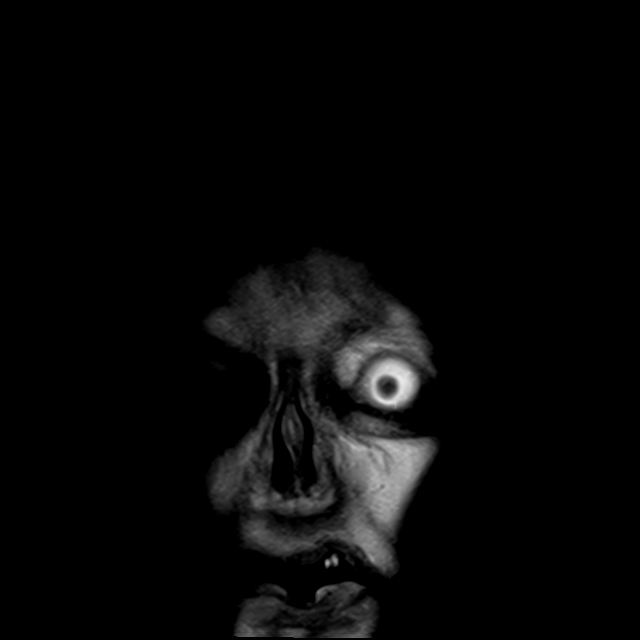

[Series 27: FLAIR · coronal · 3.0mm · 0.56mm/px · 3 of 36 slices shown (2 of 2)]
[im 1/36]
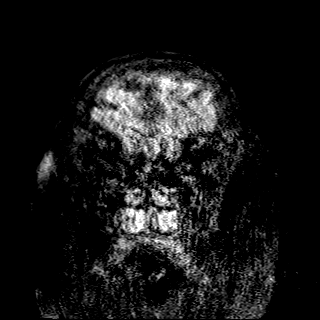
[im 18/36]
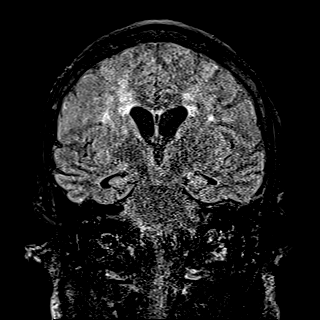
[im 36/36]
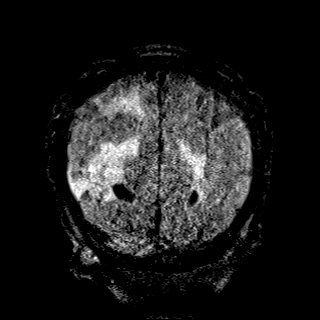

[Series 28: T2 · coronal · 3.0mm · 0.27mm/px · 3 of 36 slices shown (3 of 3)]
[im 1/36]
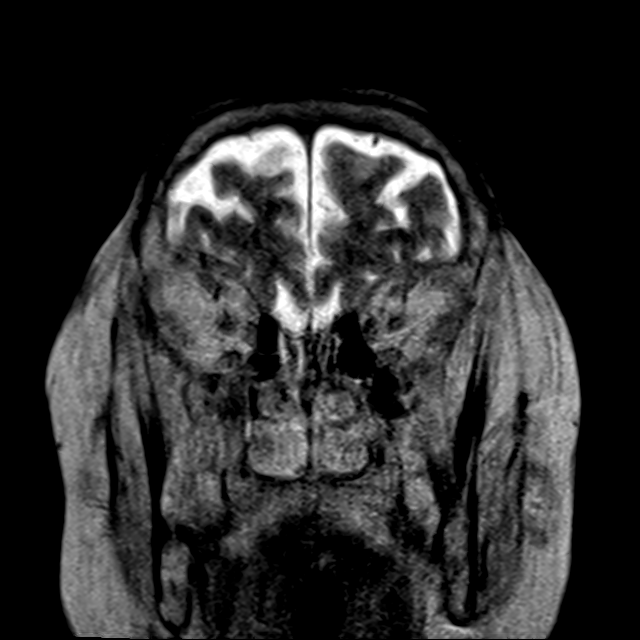
[im 18/36]
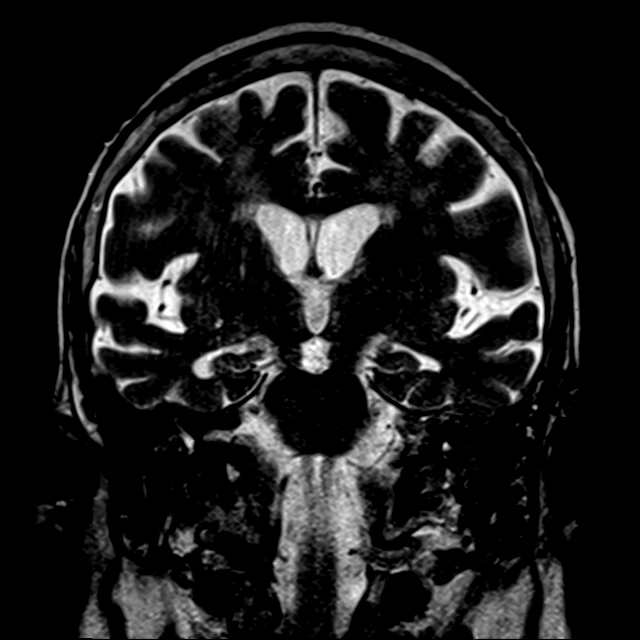
[im 36/36]
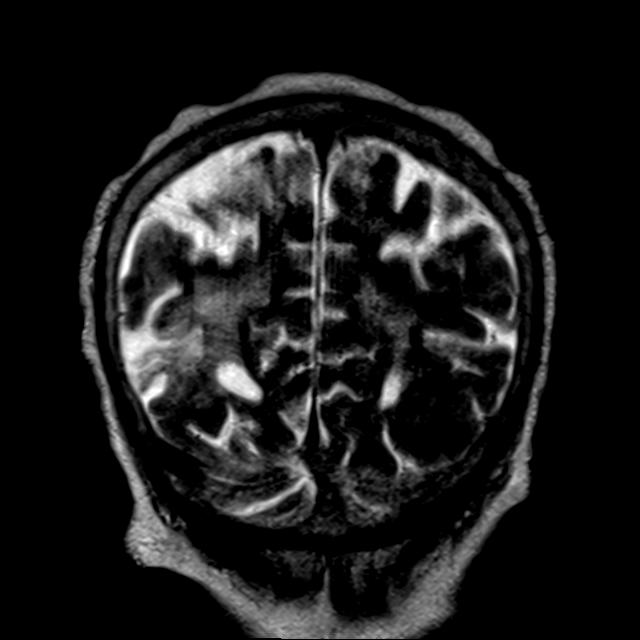

[Series 29: T1 · sagittal · 5.0mm · 0.75mm/px · 2 of 23 slices shown]
[im 1/23]
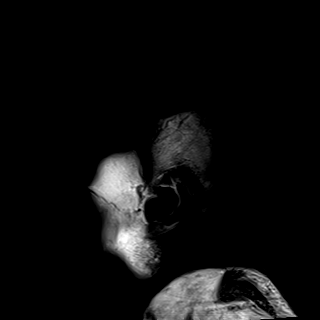
[im 23/23]
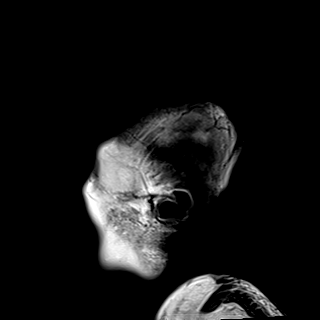

[43 of 48 positions shown; findings below may reference images not displayed]

FINDINGS: Brain: No restricted diffusion or evidence of acute infarction. On
coronal imaging hippocampal formations remain within normal limits.

Previous cortical based infarcts in both MCA territories, right
MCA/PCA watershed (with associated hemosiderin and laminar
necrosis). Numerous small chronic infarcts in the bilateral
cerebellum. Previous bilateral thalamic and basal ganglia lacunar
infarcts. Patchy and confluent additional cerebral white matter T2
and FLAIR hyperintensity.

No midline shift, mass effect, evidence of mass lesion,
ventriculomegaly, extra-axial collection or acute intracranial
hemorrhage. Cervicomedullary junction and pituitary are within
normal limits.

Vascular: Major intracranial vascular flow voids are stable since
[REDACTED].

Skull and upper cervical spine: Stable visible cervical spine with
multilevel disc degeneration and mild spondylolisthesis. Visualized
bone marrow signal is within normal limits.

Sinuses/Orbits: Negative orbits. Intubated. Fluid in the pharynx.
Trace paranasal sinus fluid.

Other: Mild bilateral mastoid fluid. Scalp and face appear negative.
IMPRESSION: 1. No acute intracranial abnormality identified.
2. Very advanced chronic ischemic disease appears stable since
[DATE].

## 2020-10-21 IMAGING — DX DG CHEST 1V PORT
1 series · 1 of 1 positions shown · non-contrast
Comparison: 08/31/2020 chest radiograph and prior.

CLINICAL DATA: Respirator mechanical failure.

EXAM:
PORTABLE CHEST 1 VIEW

[chest]
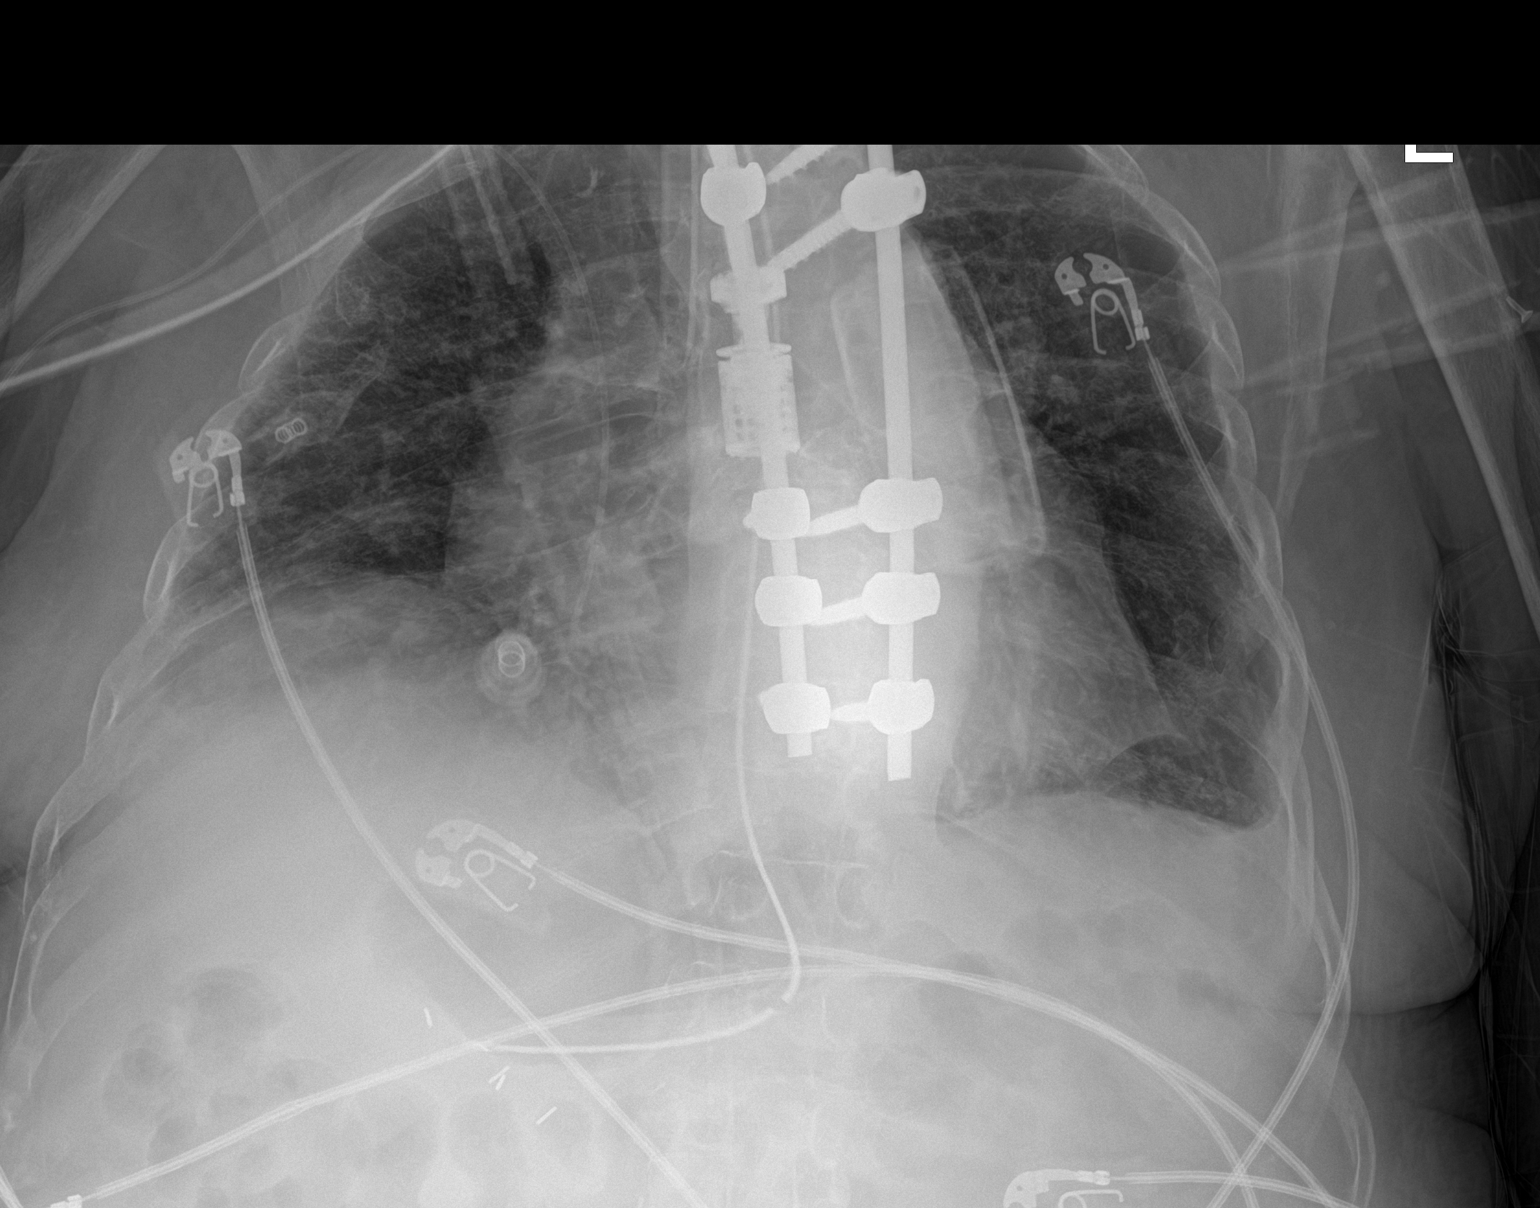

[1 of 1 positions shown; findings below may reference images not displayed]

FINDINGS: Right PICC tip overlies the deep right atrium. Endotracheal tube
terminates approximately 1.2 cm above the carina. Enteric tube tip
and side port overlies the distal gastric body.

Cardiomegaly. Diffuse interstitial prominence. No focal
consolidation. Small bilateral pleural effusions, unchanged. No
pneumothorax. Posterior spinal fixation hardware.
IMPRESSION: Diffuse interstitial prominence and cardiomegaly, unchanged.

Small bilateral pleural effusions, similar to prior exam.

## 2020-10-24 IMAGING — CT CT CHEST W/O CM
2 of 5 series · 14 of 46 positions shown, 16 images · non-contrast
Comparison: CT abdomen and pelvis 08/27/2020

CLINICAL DATA: Sepsis.

EXAM:
CT CHEST, ABDOMEN AND PELVIS WITHOUT CONTRAST
TECHNIQUE: Multidetector CT imaging of the chest, abdomen and pelvis was
performed following the standard protocol without IV contrast.

[Series 5: cap w/o 2.0 mm st · axial · non-contrast · 0.83mm/px · z∈[+980,+1456]mm · 11 of 270 slices shown, 13 images]
[im 16/270  soft-tissue]
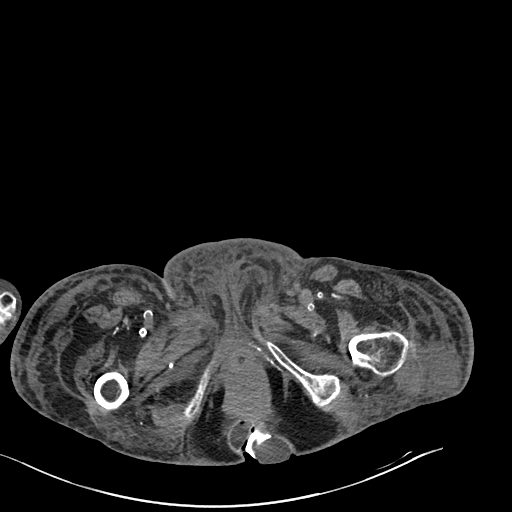
[im 16/270  bone]
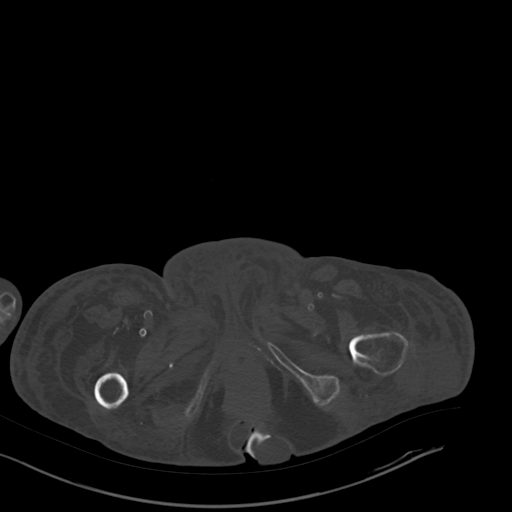
[im 48/270  soft-tissue]
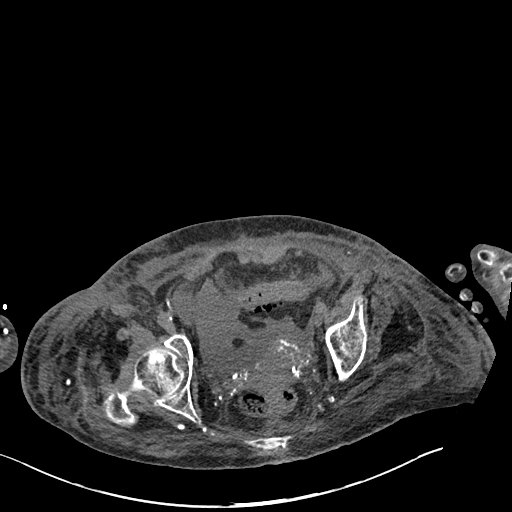
[im 64/270  soft-tissue]
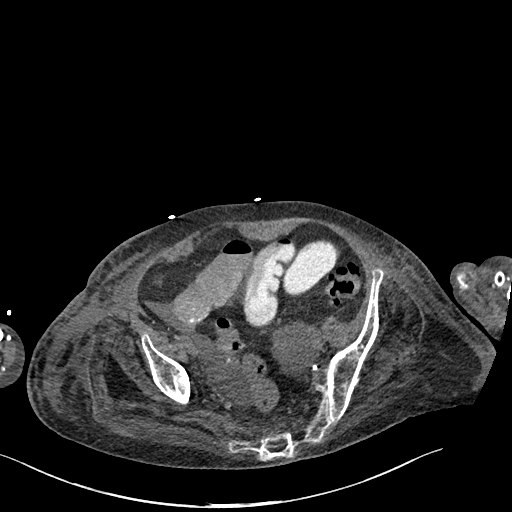
[im 95/270  soft-tissue]
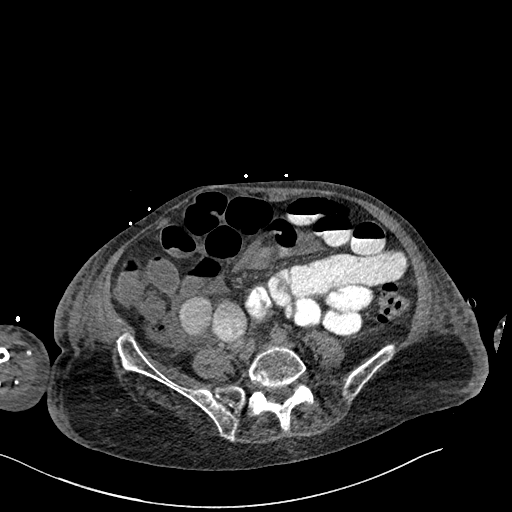
[im 111/270  soft-tissue]
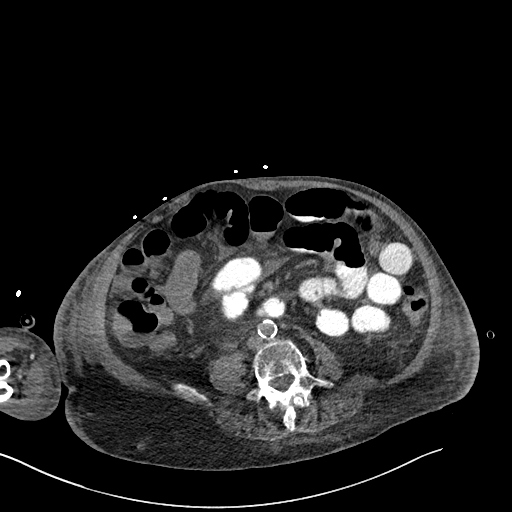
[im 143/270  soft-tissue]
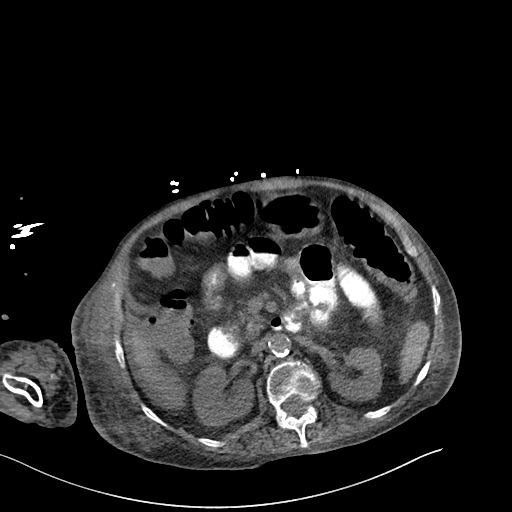
[im 159/270  soft-tissue]
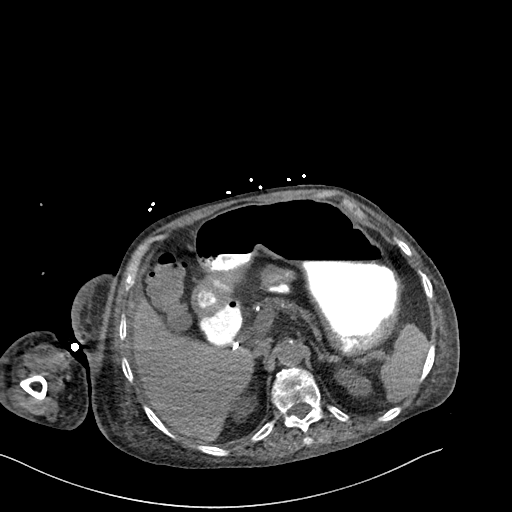
[im 175/270  soft-tissue]
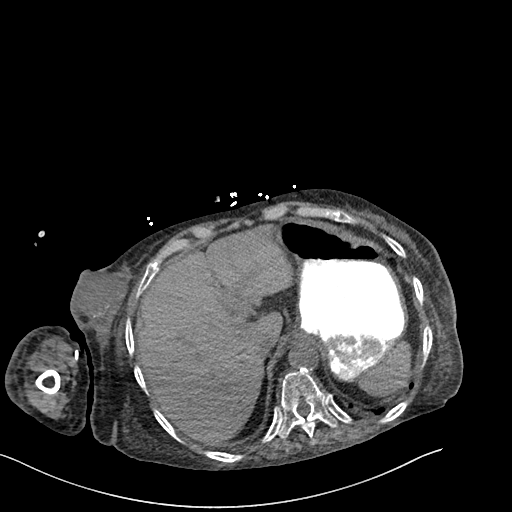
[im 206/270  soft-tissue]
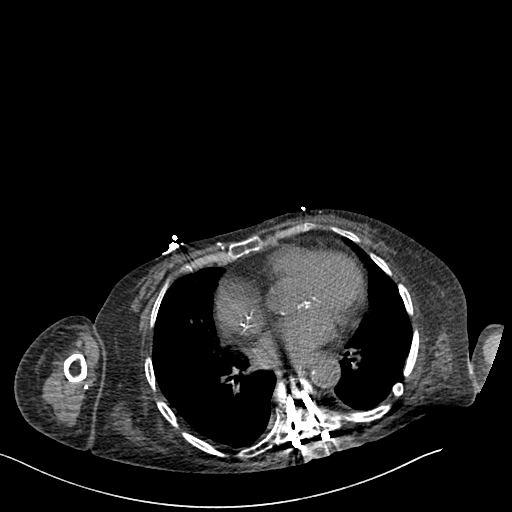
[im 206/270  bone]
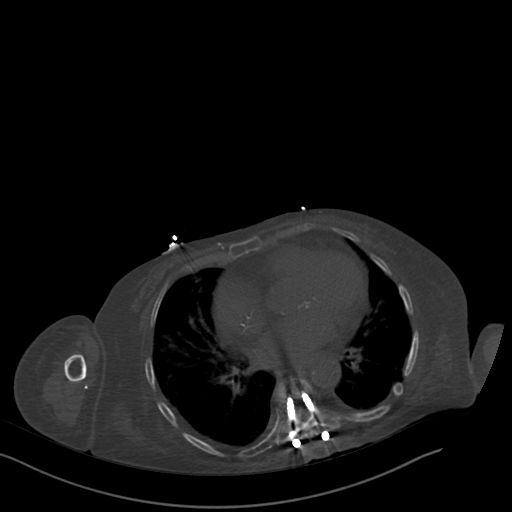
[im 222/270  soft-tissue]
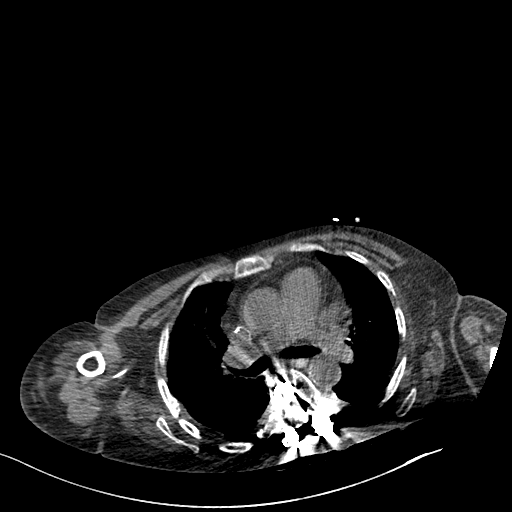
[im 254/270  soft-tissue]
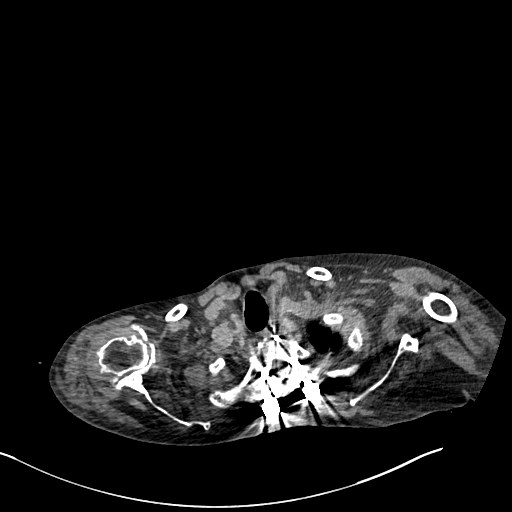

[Series 7: cap w/o 3.0 mm st cor · coronal · non-contrast · 0.68mm/px · 3 of 86 slices shown]
[im 29/86  soft-tissue]
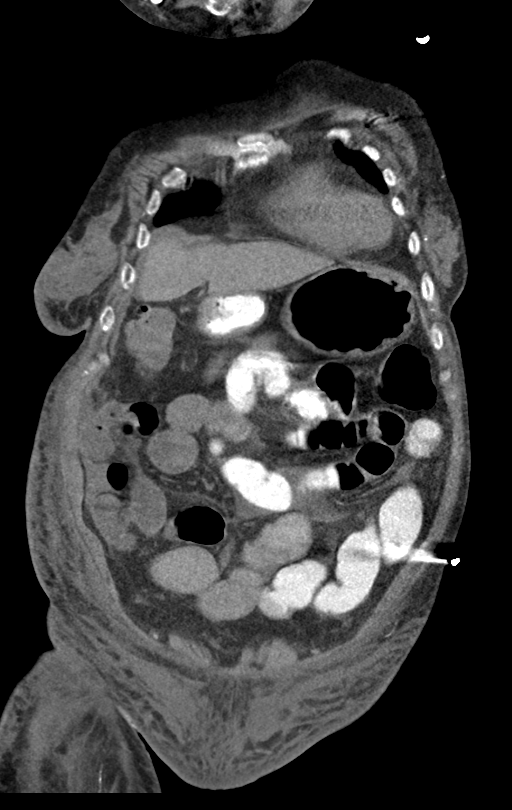
[im 38/86  soft-tissue]
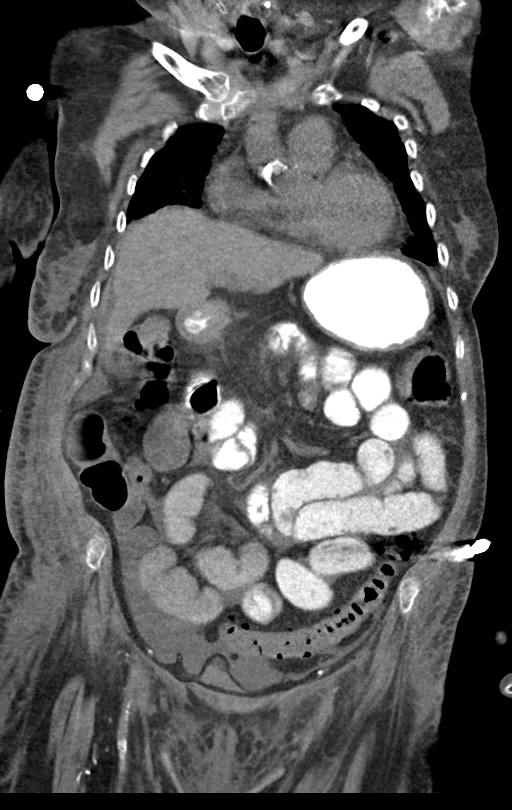
[im 48/86  soft-tissue]
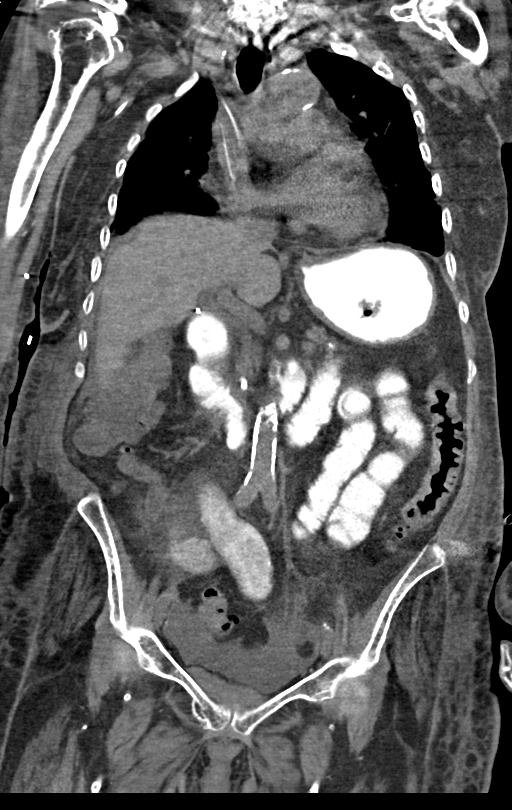

[14 of 46 positions shown; findings below may reference images not displayed]

FINDINGS: CT CHEST FINDINGS

Cardiovascular: Mild cardiac enlargement. No pericardial effusions.
Coronary artery and aortic calcifications. No aortic aneurysm. Right
central venous catheter with tip at the cavoatrial junction.

Mediastinum/Nodes: No significant lymphadenopathy. Esophagus is
decompressed. Residual contrast material in the lower esophagus may
indicate reflux or dysmotility.

Lungs/Pleura: Motion artifact significantly limits evaluation.
Suggestion of mild atelectasis in the lung bases. Possible perihilar
infiltrates without obvious consolidation. No pleural effusions.
Airways appear patent.

Musculoskeletal: Postoperative changes in the thoracic spine.
Degenerative changes in the spine and shoulders. Old rib fractures.
Old appearing compression of T12.

CT ABDOMEN PELVIS FINDINGS

Hepatobiliary: Unenhanced appearance of the liver is unremarkable.
Surgical absence of the gallbladder. No bile duct dilatation.

Pancreas: Pancreas is atrophic with scattered calcifications, likely
representing chronic pancreatitis.

Spleen: Spleen size is normal.

Adrenals/Urinary Tract: No adrenal gland nodules. Kidneys are
symmetrical. No hydronephrosis or hydroureter. No stones. Bladder is
decompressed with a Foley catheter.

Stomach/Bowel: Stomach is unremarkable. Mildly dilated small bowel
with decompression of the terminal ileum suggesting early small
bowel obstruction. Transition zone appears to be in the right lower
quadrant. Colon is mostly decompressed. Diverticulosis of the
sigmoid colon without evidence of diverticulitis. Colonic
inflammatory changes seen previously appear to of resolved.

Vascular/Lymphatic: Aortic atherosclerosis. No enlarged abdominal or
pelvic lymph nodes.

Reproductive: Uterus and ovaries are not enlarged. Vascular
calcifications of the uterus and adnexal regions.

Other: Small to moderate free fluid in the abdomen and pelvis. Mild
subcutaneous edema. There appears to be a balloon in the gluteal
crease which may represent expelled rectal catheter. Fat in the left
inguinal canal, possibly a lipoma.

Musculoskeletal: Diffuse fatty atrophy of the musculature. Diffuse
bone demineralization. Compression fractures of L1 and L3 appear
chronic. Degenerative changes in the lumbar spine and hips.
Sclerosis in the right superior femoral head may indicate avascular
necrosis.
IMPRESSION: 1. Suggestion of mild atelectasis in the lung bases with possible
perihilar infiltrates without obvious consolidation.
2. Mildly dilated small bowel with decompression of the terminal
ileum suggesting early small bowel obstruction. Transition zone
appears to be in the right lower quadrant.
3. Small to moderate free fluid in the abdomen and pelvis.
4. There appears to be a balloon in the gluteal crease which may
represent expelled rectal catheter.
5. Chronic pancreatitis.
6. Sclerosis in the right superior femoral head may indicate
avascular necrosis.
7. Fat in the left inguinal canal, possibly a lipoma.
8. Aortic atherosclerosis.
9. Diffuse fatty atrophy of the musculature.
10. Old appearing compression fractures of T12, L1, and L3.

Aortic Atherosclerosis ([id number]).

## 2020-10-25 IMAGING — DX DG ABDOMEN 1V
1 series · 1 of 1 positions shown · non-contrast
Comparison: 08/30/2020

CLINICAL DATA: Small bowel obstruction

EXAM:
ABDOMEN - 1 VIEW

[abdomen kub]
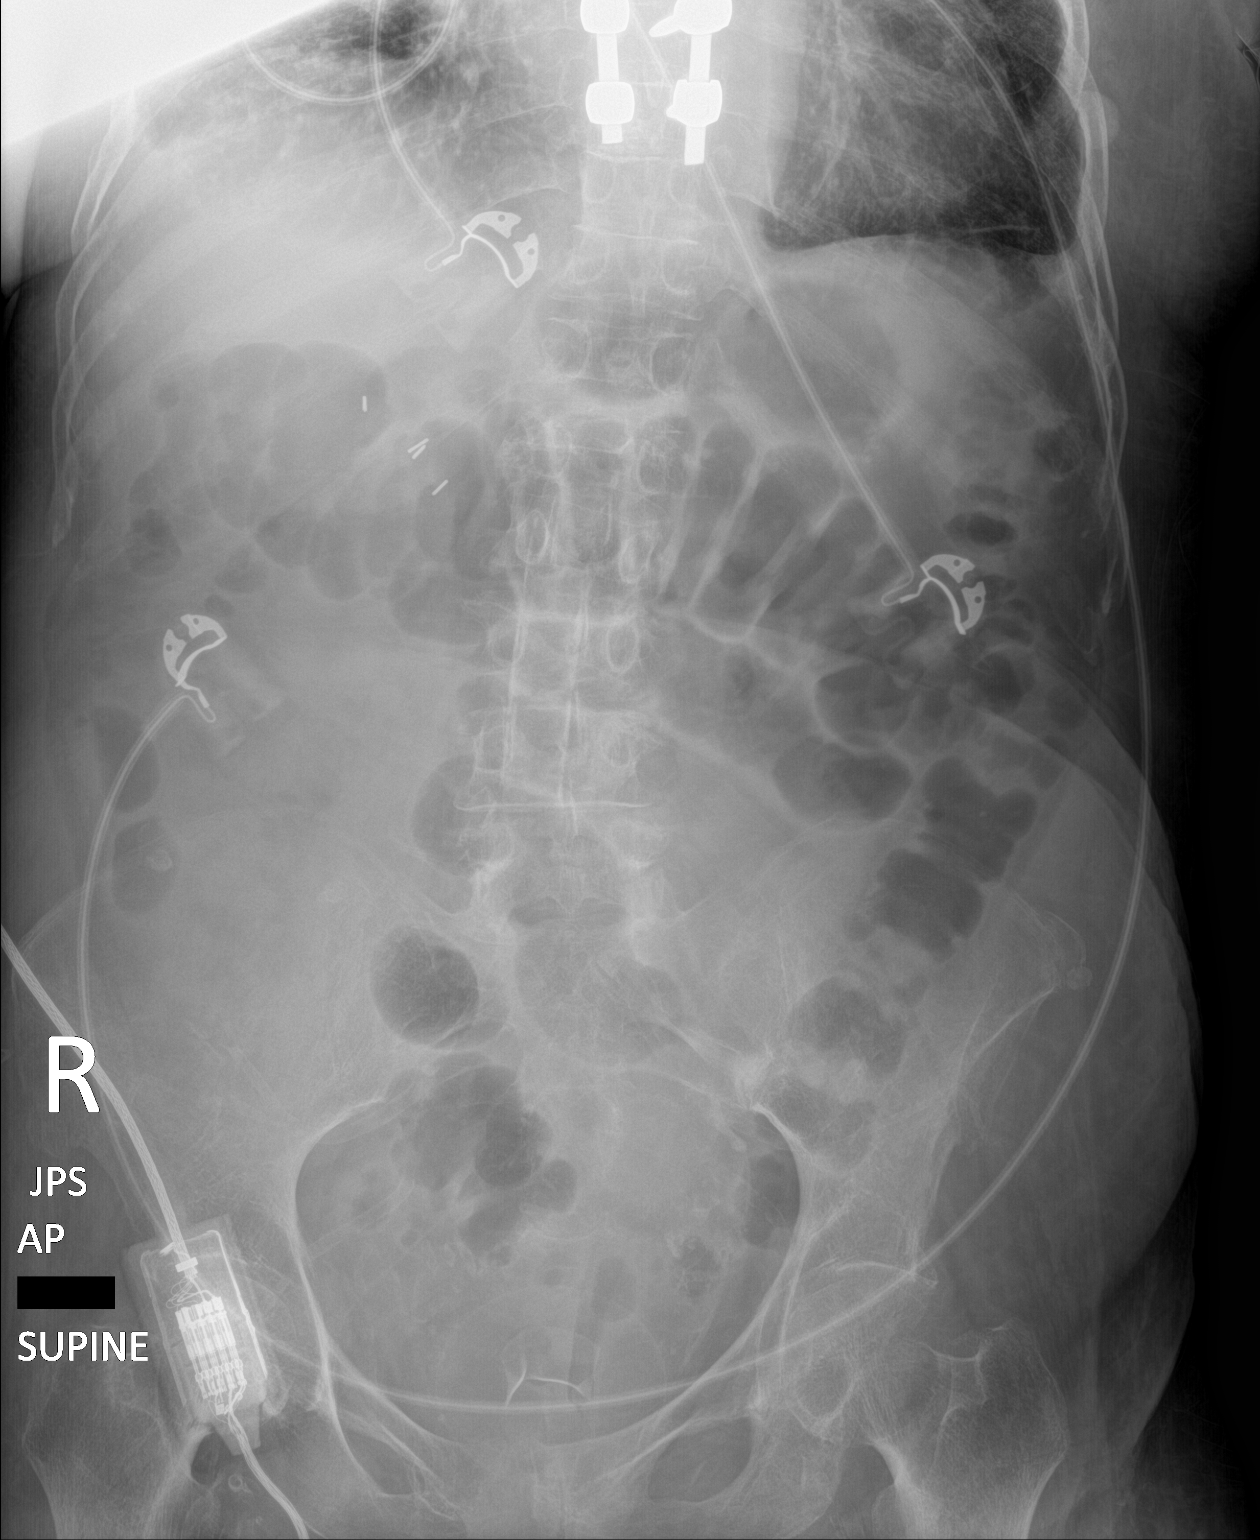

[1 of 1 positions shown; findings below may reference images not displayed]

FINDINGS: Interval clearing of contrast material from the colon. Gas-filled
nondistended colon. Mid abdominal gas-filled dilated small bowel.
Changes are consistent with small bowel obstruction but could also
indicate ileus. Surgical clips in the right upper quadrant.
Postoperative changes in the thoracic spine. Degenerative changes in
the lumbar spine and hips. Vascular calcifications.
IMPRESSION: Dilated gas-filled small bowel consistent with small bowel
obstruction or ileus.

## 2021-05-09 ENCOUNTER — Telehealth: Payer: Self-pay

## 2021-05-09 NOTE — Telephone Encounter (Signed)
Phone call placed to patient by Palliative care volunteer. No answer
